# Patient Record
Sex: Female | Born: 1965
Health system: Southern US, Community
[De-identification: ages and names within clinical notes are randomized; demographics above are authoritative.]

## PROBLEM LIST (undated history)

## (undated) DIAGNOSIS — D649 Anemia, unspecified: Secondary | ICD-10-CM

## (undated) DIAGNOSIS — Z853 Personal history of malignant neoplasm of breast: Secondary | ICD-10-CM

## (undated) DIAGNOSIS — J189 Pneumonia, unspecified organism: Secondary | ICD-10-CM

## (undated) DIAGNOSIS — R0600 Dyspnea, unspecified: Secondary | ICD-10-CM

## (undated) DIAGNOSIS — J479 Bronchiectasis, uncomplicated: Secondary | ICD-10-CM

## (undated) DIAGNOSIS — Z862 Personal history of diseases of the blood and blood-forming organs and certain disorders involving the immune mechanism: Secondary | ICD-10-CM

## (undated) DIAGNOSIS — Z923 Personal history of irradiation: Secondary | ICD-10-CM

## (undated) DIAGNOSIS — T8859XA Other complications of anesthesia, initial encounter: Secondary | ICD-10-CM

## (undated) DIAGNOSIS — M1990 Unspecified osteoarthritis, unspecified site: Secondary | ICD-10-CM

## (undated) DIAGNOSIS — C787 Secondary malignant neoplasm of liver and intrahepatic bile duct: Secondary | ICD-10-CM

## (undated) DIAGNOSIS — J449 Chronic obstructive pulmonary disease, unspecified: Secondary | ICD-10-CM

## (undated) DIAGNOSIS — J219 Acute bronchiolitis, unspecified: Secondary | ICD-10-CM

## (undated) DIAGNOSIS — Z8709 Personal history of other diseases of the respiratory system: Secondary | ICD-10-CM

## (undated) DIAGNOSIS — Z8619 Personal history of other infectious and parasitic diseases: Secondary | ICD-10-CM

## (undated) DIAGNOSIS — C50919 Malignant neoplasm of unspecified site of unspecified female breast: Secondary | ICD-10-CM

## (undated) DIAGNOSIS — C7951 Secondary malignant neoplasm of bone: Secondary | ICD-10-CM

## (undated) DIAGNOSIS — K219 Gastro-esophageal reflux disease without esophagitis: Secondary | ICD-10-CM

## (undated) DIAGNOSIS — Z9221 Personal history of antineoplastic chemotherapy: Secondary | ICD-10-CM

## (undated) DIAGNOSIS — C50912 Malignant neoplasm of unspecified site of left female breast: Secondary | ICD-10-CM

## (undated) DIAGNOSIS — R0989 Other specified symptoms and signs involving the circulatory and respiratory systems: Secondary | ICD-10-CM

## (undated) DIAGNOSIS — J45909 Unspecified asthma, uncomplicated: Secondary | ICD-10-CM

## (undated) DIAGNOSIS — C801 Malignant (primary) neoplasm, unspecified: Secondary | ICD-10-CM

## (undated) DIAGNOSIS — T4145XA Adverse effect of unspecified anesthetic, initial encounter: Secondary | ICD-10-CM

## (undated) DIAGNOSIS — Z8719 Personal history of other diseases of the digestive system: Secondary | ICD-10-CM

## (undated) DIAGNOSIS — Z972 Presence of dental prosthetic device (complete) (partial): Secondary | ICD-10-CM

## (undated) DIAGNOSIS — R51 Headache: Secondary | ICD-10-CM

## (undated) HISTORY — DX: Headache: R51

## (undated) HISTORY — DX: Malignant neoplasm of unspecified site of unspecified female breast: C50.919

## (undated) HISTORY — DX: Personal history of irradiation: Z92.3

---

## 1996-05-11 HISTORY — PX: LUNG LOBECTOMY: SHX167

## 2004-07-11 HISTORY — PX: BREAST CYST EXCISION: SHX579

## 2006-08-05 ENCOUNTER — Emergency Department (HOSPITAL_COMMUNITY): Admission: EM | Admit: 2006-08-05 | Discharge: 2006-08-05 | Payer: Self-pay | Admitting: Emergency Medicine

## 2006-11-03 ENCOUNTER — Emergency Department (HOSPITAL_COMMUNITY): Admission: EM | Admit: 2006-11-03 | Discharge: 2006-11-03 | Payer: Self-pay | Admitting: Family Medicine

## 2007-01-16 ENCOUNTER — Emergency Department (HOSPITAL_COMMUNITY): Admission: EM | Admit: 2007-01-16 | Discharge: 2007-01-16 | Payer: Self-pay | Admitting: Emergency Medicine

## 2007-02-09 ENCOUNTER — Emergency Department (HOSPITAL_COMMUNITY): Admission: EM | Admit: 2007-02-09 | Discharge: 2007-02-09 | Payer: Self-pay | Admitting: Emergency Medicine

## 2007-03-16 ENCOUNTER — Emergency Department (HOSPITAL_COMMUNITY): Admission: EM | Admit: 2007-03-16 | Discharge: 2007-03-16 | Payer: Self-pay | Admitting: Family Medicine

## 2007-04-21 ENCOUNTER — Emergency Department (HOSPITAL_COMMUNITY): Admission: EM | Admit: 2007-04-21 | Discharge: 2007-04-21 | Payer: Self-pay | Admitting: Emergency Medicine

## 2007-11-29 ENCOUNTER — Emergency Department (HOSPITAL_COMMUNITY): Admission: EM | Admit: 2007-11-29 | Discharge: 2007-11-29 | Payer: Self-pay | Admitting: Emergency Medicine

## 2008-12-10 ENCOUNTER — Emergency Department (HOSPITAL_COMMUNITY): Admission: EM | Admit: 2008-12-10 | Discharge: 2008-12-10 | Payer: Self-pay | Admitting: Family Medicine

## 2009-12-16 ENCOUNTER — Emergency Department (HOSPITAL_COMMUNITY): Admission: EM | Admit: 2009-12-16 | Discharge: 2009-12-16 | Payer: Self-pay | Admitting: Family Medicine

## 2011-01-17 ENCOUNTER — Inpatient Hospital Stay (INDEPENDENT_AMBULATORY_CARE_PROVIDER_SITE_OTHER)
Admission: RE | Admit: 2011-01-17 | Discharge: 2011-01-17 | Disposition: A | Discharge status: Home / Self Care | Payer: BLUE CROSS/BLUE SHIELD | Source: Ambulatory Visit | Attending: Emergency Medicine | Admitting: Emergency Medicine

## 2011-01-17 ENCOUNTER — Ambulatory Visit (INDEPENDENT_AMBULATORY_CARE_PROVIDER_SITE_OTHER): Payer: BLUE CROSS/BLUE SHIELD

## 2011-01-17 DIAGNOSIS — J189 Pneumonia, unspecified organism: Secondary | ICD-10-CM

## 2011-01-26 ENCOUNTER — Other Ambulatory Visit (INDEPENDENT_AMBULATORY_CARE_PROVIDER_SITE_OTHER): Payer: BC Managed Care – PPO

## 2011-01-26 ENCOUNTER — Ambulatory Visit (INDEPENDENT_AMBULATORY_CARE_PROVIDER_SITE_OTHER): Payer: BC Managed Care – PPO | Admitting: Internal Medicine

## 2011-01-26 ENCOUNTER — Encounter: Payer: Self-pay | Admitting: Internal Medicine

## 2011-01-26 ENCOUNTER — Ambulatory Visit (INDEPENDENT_AMBULATORY_CARE_PROVIDER_SITE_OTHER)
Admission: RE | Admit: 2011-01-26 | Discharge: 2011-01-26 | Disposition: A | Discharge status: Home / Self Care | Payer: BC Managed Care – PPO | Source: Ambulatory Visit | Attending: Internal Medicine | Admitting: Internal Medicine

## 2011-01-26 ENCOUNTER — Telehealth: Payer: Self-pay | Admitting: Internal Medicine

## 2011-01-26 ENCOUNTER — Other Ambulatory Visit: Payer: Self-pay | Admitting: Internal Medicine

## 2011-01-26 DIAGNOSIS — J479 Bronchiectasis, uncomplicated: Secondary | ICD-10-CM | POA: Insufficient documentation

## 2011-01-26 DIAGNOSIS — R079 Chest pain, unspecified: Secondary | ICD-10-CM

## 2011-01-26 DIAGNOSIS — F17200 Nicotine dependence, unspecified, uncomplicated: Secondary | ICD-10-CM

## 2011-01-26 DIAGNOSIS — F17210 Nicotine dependence, cigarettes, uncomplicated: Secondary | ICD-10-CM | POA: Insufficient documentation

## 2011-01-26 LAB — CBC WITH DIFFERENTIAL/PLATELET
Hemoglobin: 13.2 g/dL (ref 12.0–15.0)
Lymphocytes Relative: 24.4 % (ref 12.0–46.0)
Lymphs Abs: 2.4 10*3/uL (ref 0.7–4.0)
Monocytes Absolute: 0.7 10*3/uL (ref 0.1–1.0)
Neutro Abs: 5.7 10*3/uL (ref 1.4–7.7)
Platelets: 426 10*3/uL — ABNORMAL HIGH (ref 150.0–400.0)
RBC: 4.15 Mil/uL (ref 3.87–5.11)
RDW: 14.7 % — ABNORMAL HIGH (ref 11.5–14.6)

## 2011-01-26 LAB — BASIC METABOLIC PANEL
CO2: 28 mEq/L (ref 19–32)
Creatinine, Ser: 0.6 mg/dL (ref 0.4–1.2)
GFR: 109.61 mL/min (ref >60.00)
Glucose, Bld: 89 mg/dL (ref 70–99)
Potassium: 4.5 mEq/L (ref 3.5–5.1)

## 2011-01-26 LAB — SEDIMENTATION RATE: Sed Rate: 84 mm/hr — ABNORMAL HIGH (ref 0–22)

## 2011-01-26 MED ORDER — TRAMADOL HCL 50 MG PO TABS: ORAL_TABLET | ORAL | Status: AC

## 2011-01-26 NOTE — Progress Notes (Signed)
Quick Note:  Spoke with pt and notified of results per Dr. Wert. Pt verbalized understanding and denied any questions.  ______ 

## 2011-01-26 NOTE — Patient Instructions (Addendum)
Try prilosec 20mg   Take 30-60 min before first meal of the day and Pepcid 20 mg one bedtime until pain and cough are completely gone for at least a week without the need for cough suppression or tramadol  GERD (REFLUX)  is an extremely common cause of respiratory symptoms, many times with no significant heartburn at all.    It can be treated with medication, but also with lifestyle changes including avoidance of late meals, excessive alcohol, smoking cessation, and avoid fatty foods, chocolate, peppermint, colas, red wine, and acidic juices such as orange juice.  NO MINT OR MENTHOL PRODUCTS SO NO COUGH DROPS  USE SUGARLESS CANDY INSTEAD (jolley ranchers or Stover's)  NO OIL BASED VITAMINS   Tramadol 50 mg one every 4 hours as needed for pain  Commit to quit smoking   Please see patient coordinator before you leave today  to schedule for ct chest   Please schedule a follow up office visit in 2  weeks, sooner if needed   LATE ADD: Needs pneumovax now and in 5 years per guidelines - address next ov

## 2011-01-26 NOTE — Assessment & Plan Note (Addendum)
Recurrent pattern for over a decade suggests a dx of bronchiectasis with exac, not pneumonia.  Discussed in detail all the  indications, usual  risks and alternatives  relative to the benefits with patient and husban who agree  to proceed with conservative rx for now with lab profile, ct chest and regroup in 2 weeks

## 2011-01-26 NOTE — Progress Notes (Signed)
Subjective:     Patient ID: Melissa [name], female   DOB: 12-Dec-1965, 45 y.o.   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   No sweats or nasty mucus now. Some better at day of ov p rx with  rx dex and zithromax but  some sorethroat since dex with overt hb.   Pt denies any significant   itching, sneezing,  nasal congestion or excess/ purulent secretions,  fever, chills, sweats, unintended wt loss,   exertional cp, hempoptysis, orthopnea pnd or leg swelling.    Also denies any obvious fluctuation of symptoms with weather or environmental changes or other aggravating or alleviating factors.      Review of Systems  Constitutional: Negative for fever, chills and unexpected weight change.  HENT: Positive for sore throat and trouble swallowing. Negative for ear pain, nosebleeds, congestion, rhinorrhea, sneezing, dental problem, voice change, postnasal drip and sinus pressure.   Eyes: Negative for visual disturbance.  Respiratory: Positive for cough and shortness of breath. Negative for choking and wheezing.   Cardiovascular: Positive for chest pain and palpitations. Negative for leg swelling.  Gastrointestinal: Negative for vomiting, abdominal pain and diarrhea.  Genitourinary: Negative for difficulty urinating.  Musculoskeletal: Negative for arthralgias.  Skin: Negative for rash.  Neurological:  Negative for tremors, syncope and headaches.  Hematological: Does not bruise/bleed easily.       Objective:   Physical Exam Mod anxious but pleasant amb wf nad HEENT: nl dentition, turbinates, and orophanx. Nl external ear canals without cough reflex   NECK :  without JVD/Nodes/TM/ nl carotid upstrokes bilaterally   LUNGS: no acc muscle use, clear to A and P bilaterally without cough on insp or exp maneuvers   CV:  RRR  no s3 or murmur or increase in P2, no edema   ABD:  soft and nontender with nl excursion in the supine position. No bruits or organomegaly, bowel sounds nl  MS:  warm without deformities, calf tenderness, cyanosis or clubbing  SKIN: warm and dry without lesions    NEURO:  alert, approp, no deficits     cxr 01/26/2011 1. Progressive opacification and volume loss at the apex of the  left hemithorax since prev study 2008.  Superimposed pneumonia could have this  appearance. Malignancy cannot be excluded. Follow-up to clearing  or chest CT with contrast is recommended.  2. Left basilar scarring.  Assessment:         Plan:

## 2011-01-27 NOTE — Telephone Encounter (Signed)
Results have been reviewed by MW and pt is aware.

## 2011-01-31 ENCOUNTER — Telehealth: Payer: Self-pay | Admitting: Internal Medicine

## 2011-01-31 NOTE — Telephone Encounter (Signed)
Pt states she faxed a physician statement form for [facility] to be completed by Dr. Sherene Sires on 01-27-11 and she was wanting to make sure we received it. I advised her I will send message to [facility] nurse to see if she has seen the paperwork. I also advised her on protocol of sending the paperwork to [employer] to have them complete. Pt states understanding. I called [employer] and they state they do not have the forms . Please advise.Carron Curie, CMA

## 2011-01-31 NOTE — Telephone Encounter (Signed)
LMOMTCB x 1 

## 2011-02-07 NOTE — Telephone Encounter (Signed)
Verlon Au, please advise if you have these papers or know the status of them. Thanks.

## 2011-02-10 ENCOUNTER — Ambulatory Visit (INDEPENDENT_AMBULATORY_CARE_PROVIDER_SITE_OTHER): Payer: BC Managed Care – PPO | Admitting: Internal Medicine

## 2011-02-10 ENCOUNTER — Encounter: Payer: Self-pay | Admitting: Internal Medicine

## 2011-02-10 DIAGNOSIS — J479 Bronchiectasis, uncomplicated: Secondary | ICD-10-CM

## 2011-02-10 DIAGNOSIS — F17200 Nicotine dependence, unspecified, uncomplicated: Secondary | ICD-10-CM

## 2011-02-10 MED ORDER — AMOXICILLIN 500 MG PO CAPS: 500.0000 mg | ORAL_CAPSULE | Freq: Three times a day (TID) | ORAL | Status: AC

## 2011-02-10 NOTE — Progress Notes (Signed)
Subjective:     Patient ID: Melissa [name], female   DOB: [birth year], 45 y.o.   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   No sweats or nasty mucus now. Some better at day of ov p rx with  rx dex and zithromax but  some sorethroat since dex with overt hb.rec ct chest POS BRONCHIECTASIS    02/10/2011 ov/[name] cc cp better, only used 3 tramadol since last ov. No sob  Pt denies any significant sore throat, dysphagia, itching, sneezing,  nasal congestion or excess/ purulent secretions,  fever, chills, sweats, unintended wt loss, pleuritic or exertional cp, hempoptysis, orthopnea pnd or leg swelling.    Also denies any obvious fluctuation of symptoms with weather or environmental changes or other aggravating or alleviating factors.            Objective:   Physical Exam Mod anxious but pleasant amb wf nad HEENT: nl dentition, turbinates, and orophanx. Nl external ear canals without cough reflex   NECK :  without JVD/Nodes/TM/ nl carotid upstrokes bilaterally   LUNGS: no acc muscle use, clear to A and P bilaterally without cough on insp or exp maneuvers   CV:  RRR  no s3 or murmur or increase in P2, no edema   ABD:  soft and nontender with nl excursion in the supine position. No bruits or organomegaly, bowel sounds nl  MS:  warm without deformities, calf tenderness, cyanosis or  clubbing  SKIN: warm and dry without lesions    NEURO:  alert, approp, no deficits     cxr 01/26/2011 1. Progressive opacification and volume loss at the apex of the  left hemithorax since prev study 2008.  Superimposed pneumonia could have this  appearance. Malignancy cannot be excluded. Follow-up to clearing  or chest CT with contrast is recommended.  2. Left basilar scarring.  Assessment:         Plan:

## 2011-02-10 NOTE — Telephone Encounter (Signed)
Discussed this with pt today at OV and she states that this issue has already been resolved.

## 2011-02-10 NOTE — Assessment & Plan Note (Signed)
Classic stereotypical pattern unchanged x years is c/w bronchiectasis  I had an extended discussion with the patient today lasting 15 to 20 minutes of a 25 minute visit on the following issues:  Discussed in detail all the  indications, usual  risks and alternatives  relative to the benefits with patient who agrees to proceed with medical management as outline in instructions.  See instructions for specific recommendations which were reviewed directly with the patient who was given a copy with highlighter outlining the key components.

## 2011-02-10 NOTE — Assessment & Plan Note (Signed)
Considering chantix, urged to commit to quit and follow through

## 2011-02-10 NOTE — Patient Instructions (Addendum)
Bronchiectasis =   you have scarring of your bronchial tubes which means that they don't function perfectly normally and mucus tends to pool in certain areas of your lung which can cause pneumonia and further scarring of your lung and bronchial tubes  Whenever you develop cough congestion take mucinex or mucinex dm > these will help keep the mucus loose and flowing but if your condition worsens you need to seek help immediately preferably here or somewhere inside the [facility] system to compare xrays ( worse = darker or bloody mucus or pain on breathing in)   Amoxicillin 500mg  3 x daily x 10 days is a reasonable way to treat flares (increased pain, change in mucus) with less tendency to cause side effects than many choices.  Stopping smoking is critical  Please schedule a follow up visit in 3 months but call sooner if needed  - needs f/u cxr  On return, placed in tickle file

## 2011-04-06 ENCOUNTER — Telehealth: Payer: Self-pay | Admitting: *Deleted

## 2011-04-06 NOTE — Telephone Encounter (Signed)
LMTCB for pt - She needs rov with cxr by 04/27/11.

## 2011-04-06 NOTE — Telephone Encounter (Signed)
OV sched 04/25/11

## 2011-04-06 NOTE — Telephone Encounter (Signed)
Message copied by Christen Butter on Wed Apr 06, 2011 11:37 AM ------      Message from: Christen Butter      Created: Tue Feb 15, 2011 10:33 AM       Needs cxr by 04/27/11

## 2011-04-21 LAB — POCT URINALYSIS DIP (DEVICE)
Nitrite: NEGATIVE
Specific Gravity, Urine: 1.01
Urobilinogen, UA: 0.2

## 2011-04-22 LAB — CBC
Hemoglobin: 13.5
MCHC: 34.1
Platelets: 328
RBC: 4.24

## 2011-04-22 LAB — DIFFERENTIAL
Basophils Absolute: 0.1
Basophils Relative: 1
Lymphocytes Relative: 16
Monocytes Absolute: 0.8 — ABNORMAL HIGH
Monocytes Relative: 8
Neutro Abs: 7.2
Neutrophils Relative %: 68

## 2011-04-25 ENCOUNTER — Encounter: Payer: BC Managed Care – PPO | Admitting: Internal Medicine

## 2011-04-25 LAB — STREP A DNA PROBE: Group A Strep Probe: NEGATIVE

## 2011-04-25 LAB — POCT RAPID STREP A: Streptococcus, Group A Screen (Direct): NEGATIVE

## 2011-04-25 NOTE — Progress Notes (Signed)
Subjective:     Patient ID: Melissa [name], female   DOB: Mar 09, 1966, 45 y.o.   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   No sweats or nasty mucus now. Some better at day of ov p rx with  rx dex and zithromax but  some sorethroat since dex with overt hb.rec ct chest POS BRONCHIECTASIS    02/10/2011 ov/Manjinder Breau cc cp better, only used 3 tramadol since last ov. No sob.    04/25/2011 f/u ov/Erick Murin cc Pt denies any significant sore throat, dysphagia, itching, sneezing,  nasal congestion or excess/ purulent secretions,  fever, chills, sweats, unintended wt loss, pleuritic or exertional cp, hempoptysis, orthopnea pnd or leg swelling.    Also denies any obvious fluctuation of symptoms with weather or environmental changes or other aggravating or alleviating factors.            Objective:   Physical Exam Mod anxious but pleasant amb wf nad Wt 118  02/10/11 >  04/25/2011  HEENT: nl dentition, turbinates, and orophanx. Nl external ear canals without cough reflex   NECK :  without JVD/Nodes/TM/ nl carotid upstrokes bilaterally   LUNGS: no acc muscle use, clear to A and P bilaterally without cough on insp or exp maneuvers   CV:  RRR  no s3 or murmur or increase in P2, no edema   ABD:  soft and nontender with nl excursion in the supine position. No bruits or organomegaly, bowel sounds nl  MS:   warm without deformities, calf tenderness, cyanosis or clubbing  SKIN: warm and dry without lesions    NEURO:  alert, approp, no deficits     cxr 01/26/2011 1. Progressive opacification and volume loss at the apex of the  left hemithorax since prev study 2008.  Superimposed pneumonia could have this  appearance. Malignancy cannot be excluded. Follow-up to clearing  or chest CT with contrast is recommended.  2. Left basilar scarring.  Assessment:         Plan:

## 2011-05-03 ENCOUNTER — Encounter: Payer: Self-pay | Admitting: Internal Medicine

## 2011-05-03 ENCOUNTER — Ambulatory Visit (INDEPENDENT_AMBULATORY_CARE_PROVIDER_SITE_OTHER)
Admission: RE | Admit: 2011-05-03 | Discharge: 2011-05-03 | Disposition: A | Discharge status: Home / Self Care | Payer: BC Managed Care – PPO | Source: Ambulatory Visit | Attending: Internal Medicine | Admitting: Internal Medicine

## 2011-05-03 ENCOUNTER — Ambulatory Visit (INDEPENDENT_AMBULATORY_CARE_PROVIDER_SITE_OTHER): Payer: BC Managed Care – PPO | Admitting: Internal Medicine

## 2011-05-03 DIAGNOSIS — Z23 Encounter for immunization: Secondary | ICD-10-CM

## 2011-05-03 DIAGNOSIS — J479 Bronchiectasis, uncomplicated: Secondary | ICD-10-CM

## 2011-05-03 NOTE — Progress Notes (Signed)
Subjective:     Patient ID: Melissa [name], female   DOB: May 01, 1966, 45 y.o.   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   No sweats or nasty mucus now. Some better at day of ov p rx with  rx dex and zithromax but  some sorethroat since dex with overt hb.rec ct chest POS BRONCHIECTASIS    02/10/2011 ov/Wert cc cp better, only used 3 tramadol since last ov. No sob. rec Bronchiectasis =   you have scarring of your bronchial tubes which means that they don't function perfectly normally and mucus tends to pool in certain areas of your lung which can cause pneumonia and further scarring of your lung and bronchial tubes  Whenever you develop cough congestion take mucinex or mucinex dm > these will help keep the mucus loose and flowing but if your condition worsens you need to seek help immediately preferably here or somewhere inside the [facility] system to compare xrays ( worse = darker or bloody mucus or pain on breathing in)   Amoxicillin 500mg  3 x daily x 10 days is a reasonable way to treat flares (increased pain, change in mucus) with less tendency to cause side effects than many choices.  Stopping smoking is critical  Please schedule a follow up visit in 3 months but call sooner if needed  - needs f/u cxr      05/03/2011  f/u ov/Wert cc cough imporved but worst first thing in am, took one course of  amoxicillin, rare albuterol use. 2 flights doe = baseline   Sleeping ok without nocturnal  or early am exacerbation  of respiratory  c/o's or need for noct saba. Also denies any obvious fluctuation of symptoms with weather or environmental changes or other aggravating or alleviating factors except as outlined above    Pt denies any significant sore throat, dysphagia, itching, sneezing,  nasal congestion or excess/ purulent secretions,  fever, chills, sweats, unintended wt loss, pleuritic or exertional cp, hempoptysis, orthopnea pnd or leg swelling.              Objective:   Physical Exam Mod anxious but pleasant amb wf nad  Wt 118  02/10/11 >  05/03/2011  118  HEENT: nl dentition, turbinates, and orophanx. Nl external ear canals without cough reflex   NECK :  without JVD/Nodes/TM/ nl carotid upstrokes bilaterally   LUNGS: no acc muscle use, clear to A and P bilaterally without cough on insp or exp maneuvers   CV:  RRR  no s3 or murmur or increase in P2, no edema   ABD:  soft and nontender with nl excursion in the supine position. No bruits or organomegaly, bowel sounds nl  MS:  warm without deformities, calf tenderness, cyanosis or clubbing     CXR  05/03/2011 :  Stable appearance  of opacity in the medial aspect of left upper hemithorax consistent with prior left upper lobectomy. Overall hyperinflation configuration. No acute superimposed process. No pulmonary edema, pneumonia, or pleural effusion is evident   Assessment:         Plan:

## 2011-05-03 NOTE — Assessment & Plan Note (Signed)
-   CT chest   01/26/2011 Status post left upper lobectomy.  Focal bronchiectasis / scarring medially in the left lung apex with  surrounding inflammatory changes  - Pneumovax 02/10/2011    CXR no change today and doing very well on a minimal regimen but still smoking. Needs pft's for baseline purposes yet and commit to quit

## 2011-05-03 NOTE — Patient Instructions (Signed)
Please schedule a follow up visit in 3 months but call sooner if needed with PFT's on return.

## 2012-03-09 ENCOUNTER — Other Ambulatory Visit: Payer: Self-pay | Admitting: Internal Medicine

## 2012-03-23 ENCOUNTER — Encounter: Payer: Self-pay | Admitting: Adult Health

## 2012-03-23 ENCOUNTER — Ambulatory Visit (INDEPENDENT_AMBULATORY_CARE_PROVIDER_SITE_OTHER): Payer: BC Managed Care – PPO | Admitting: Adult Health

## 2012-03-23 VITALS — BP 106/64 | HR 77 | Temp 97.6°F | Ht 62.0 in | Wt 117.0 lb

## 2012-03-23 DIAGNOSIS — J479 Bronchiectasis, uncomplicated: Secondary | ICD-10-CM

## 2012-03-23 MED ORDER — AMOXICILLIN 500 MG PO CAPS: 500.0000 mg | ORAL_CAPSULE | Freq: Three times a day (TID) | ORAL | Status: AC

## 2012-03-23 MED ORDER — AMOXICILLIN-POT CLAVULANATE 875-125 MG PO TABS: 1.0000 | ORAL_TABLET | Freq: Two times a day (BID) | ORAL | Status: AC

## 2012-03-23 MED ORDER — AMOXICILLIN 500 MG PO CAPS: 500.0000 mg | ORAL_CAPSULE | Freq: Three times a day (TID) | ORAL | Status: DC

## 2012-03-23 NOTE — Progress Notes (Signed)
Subjective:     Patient ID: Melissa [name], female   DOB: 07/07/1966, 46 y.o.   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   No sweats or nasty mucus now. Some better at day of ov p rx with  rx dex and zithromax but  some sorethroat since dex with overt hb.rec ct chest POS BRONCHIECTASIS    02/10/2011 ov/Wert cc cp better, only used 3 tramadol since last ov. No sob. rec Bronchiectasis =   you have scarring of your bronchial tubes which means that they don't function perfectly normally and mucus tends to pool in certain areas of your lung which can cause pneumonia and further scarring of your lung and bronchial tubes  Whenever you develop cough congestion take mucinex or mucinex dm > these will help keep the mucus loose and flowing but if your condition worsens you need to seek help immediately preferably here or somewhere inside the [facility] system to compare xrays ( worse = darker or bloody mucus or pain on breathing in)   Amoxicillin 500mg  3 x daily x 10 days is a reasonable way to treat flares (increased pain, change in mucus) with less tendency to cause side effects than many choices.  Stopping smoking is critical  Please schedule a follow up visit in 3 months but call sooner if needed  - needs f/u cxr      05/03/2011  f/u ov/Wert cc cough imporved but worst first thing in am, took one course of  amoxicillin, rare albuterol use. 2 flights doe = baseline >>no changes   03/23/2012 Acute OV  Complains of wheezing, chest congestion, occ prod cough with small amounts of clear/yellow mucus, occ tightness in chest, increased DOE x2 weeks.  Still smoking . Discussed smoking cessatation  Ran out of Amoxicillin this month. Has been taking Amoxicillin first 10 days of each month .  No fever or hemoptysis or edema.       ROS:  Constitutional:   No  weight loss, night sweats,  Fevers, chills,  +fatigue, or  lassitude.  HEENT:   No headaches,  Difficulty swallowing,  Tooth/dental problems, or  Sore throat,                No sneezing, itching, ear ache,  +nasal congestion, post nasal drip,   CV:  No chest pain,  Orthopnea, PND, swelling in lower extremities, anasarca, dizziness, palpitations, syncope.   GI  No heartburn, indigestion, abdominal pain, nausea, vomiting, diarrhea, change in bowel habits, loss of appetite, bloody stools.   Resp:        No chest wall deformity  Skin: no rash or lesions.  GU: no dysuria, change in color of urine, no urgency or frequency.  No flank pain, no hematuria   MS:  No joint pain or swelling.  No  decreased range of motion.  No back pain.  Psych:  No change in mood or affect. No depression or anxiety.  No memory loss.         Objective:   Physical Exam Mod anxious but pleasant amb wf nad  Wt 118  02/10/11 >  05/03/2011  118 > 117 03/23/2012   HEENT: nl dentition, turbinates, and orophanx. Nl external ear canals without cough reflex   NECK :  without JVD/Nodes/TM/ nl carotid upstrokes bilaterally   LUNGS: no acc muscle use, coarse BS    CV:  RRR  no s3 or murmur or increase in P2, no edema   ABD:  soft and nontender with nl excursion in the supine position. No bruits or organomegaly, bowel sounds nl  MS:  warm without deformities, calf tenderness, cyanosis or clubbing     CXR  05/03/2011 :  Stable appearance of opacity in the medial  aspect of left upper hemithorax consistent with prior left upper lobectomy. Overall hyperinflation configuration. No acute superimposed process. No pulmonary edema, pneumonia, or pleural effusion is evident   Assessment:         Plan:

## 2012-03-23 NOTE — Patient Instructions (Addendum)
Augmentin 875mg  Twice daily  For 10 days  Mucinex DM Twice daily  As needed  .  Fluids and rest  Beginning,  October  restart Amoxicillin 500mg  Three times a day  For the first 10 days of each month  follow up with Dr. Sherene Sires  In 6 weeks with PFT  Please contact office for sooner follow up if symptoms do not improve or worsen or seek emergency care

## 2012-03-23 NOTE — Addendum Note (Signed)
Addended by: Boone Master E on: 03/23/2012 03:51 PM   Modules accepted: Orders

## 2012-03-23 NOTE — Assessment & Plan Note (Signed)
Flare   Plan  Augmentin 875mg  Twice daily  For 10 days  Mucinex DM Twice daily  As needed  .  Fluids and rest  Beginning,  October  restart Amoxicillin 500mg  Three times a day  For the first 10 days of each month  follow up with Dr. Sherene Sires  In 6 weeks with PFT  Please contact office for sooner follow up if symptoms do not improve or worsen or seek emergency care

## 2012-05-29 ENCOUNTER — Other Ambulatory Visit: Payer: Self-pay | Admitting: Family Medicine

## 2012-05-29 DIAGNOSIS — N63 Unspecified lump in unspecified breast: Secondary | ICD-10-CM

## 2012-06-05 ENCOUNTER — Ambulatory Visit
Admission: RE | Admit: 2012-06-05 | Discharge: 2012-06-05 | Disposition: A | Discharge status: Home / Self Care | Payer: BC Managed Care – PPO | Source: Ambulatory Visit | Attending: Family Medicine | Admitting: Family Medicine

## 2012-06-05 ENCOUNTER — Other Ambulatory Visit: Payer: Self-pay | Admitting: Family Medicine

## 2012-06-05 DIAGNOSIS — N63 Unspecified lump in unspecified breast: Secondary | ICD-10-CM

## 2012-06-06 ENCOUNTER — Other Ambulatory Visit: Payer: Self-pay | Admitting: Family Medicine

## 2012-06-06 ENCOUNTER — Ambulatory Visit
Admission: RE | Admit: 2012-06-06 | Discharge: 2012-06-06 | Disposition: A | Discharge status: Home / Self Care | Payer: BC Managed Care – PPO | Source: Ambulatory Visit | Attending: Family Medicine | Admitting: Family Medicine

## 2012-06-06 DIAGNOSIS — N63 Unspecified lump in unspecified breast: Secondary | ICD-10-CM

## 2012-06-06 DIAGNOSIS — C50911 Malignant neoplasm of unspecified site of right female breast: Secondary | ICD-10-CM

## 2012-06-08 ENCOUNTER — Telehealth: Payer: Self-pay | Admitting: *Deleted

## 2012-06-08 DIAGNOSIS — C50219 Malignant neoplasm of upper-inner quadrant of unspecified female breast: Secondary | ICD-10-CM

## 2012-06-08 DIAGNOSIS — C50212 Malignant neoplasm of upper-inner quadrant of left female breast: Secondary | ICD-10-CM | POA: Insufficient documentation

## 2012-06-08 NOTE — Telephone Encounter (Signed)
Confirmed [facility] for 06/13/12 at 0800 .  Instructions and contact information given.

## 2012-06-12 ENCOUNTER — Ambulatory Visit
Admission: RE | Admit: 2012-06-12 | Discharge: 2012-06-12 | Disposition: A | Discharge status: Home / Self Care | Payer: BC Managed Care – PPO | Source: Ambulatory Visit | Attending: Family Medicine | Admitting: Family Medicine

## 2012-06-12 DIAGNOSIS — C50911 Malignant neoplasm of unspecified site of right female breast: Secondary | ICD-10-CM

## 2012-06-12 MED ORDER — GADOBENATE DIMEGLUMINE 529 MG/ML IV SOLN
9.0000 mL | Freq: Once | INTRAVENOUS | Status: AC | PRN
  Administered 2012-06-12: 9 mL via INTRAVENOUS

## 2012-06-13 ENCOUNTER — Encounter: Payer: Self-pay | Admitting: Oncology

## 2012-06-13 ENCOUNTER — Ambulatory Visit: Payer: BC Managed Care – PPO | Admitting: Physical Therapy

## 2012-06-13 ENCOUNTER — Other Ambulatory Visit (HOSPITAL_BASED_OUTPATIENT_CLINIC_OR_DEPARTMENT_OTHER): Payer: BC Managed Care – PPO

## 2012-06-13 ENCOUNTER — Ambulatory Visit: Payer: BC Managed Care – PPO

## 2012-06-13 ENCOUNTER — Ambulatory Visit (HOSPITAL_BASED_OUTPATIENT_CLINIC_OR_DEPARTMENT_OTHER): Payer: BC Managed Care – PPO | Admitting: Oncology

## 2012-06-13 ENCOUNTER — Ambulatory Visit: Payer: BC Managed Care – PPO | Attending: General Surgery | Admitting: Physical Therapy

## 2012-06-13 ENCOUNTER — Telehealth: Payer: Self-pay | Admitting: *Deleted

## 2012-06-13 ENCOUNTER — Encounter: Payer: Self-pay | Admitting: *Deleted

## 2012-06-13 ENCOUNTER — Ambulatory Visit
Admission: RE | Admit: 2012-06-13 | Discharge: 2012-06-13 | Disposition: A | Discharge status: Home / Self Care | Payer: BC Managed Care – PPO | Source: Ambulatory Visit | Attending: Radiation Oncology | Admitting: Radiation Oncology

## 2012-06-13 ENCOUNTER — Ambulatory Visit (HOSPITAL_BASED_OUTPATIENT_CLINIC_OR_DEPARTMENT_OTHER): Payer: BC Managed Care – PPO | Admitting: General Surgery

## 2012-06-13 VITALS — BP 108/74 | HR 76 | Temp 98.6°F | Resp 20 | Ht 62.0 in | Wt 114.8 lb

## 2012-06-13 DIAGNOSIS — C50219 Malignant neoplasm of upper-inner quadrant of unspecified female breast: Secondary | ICD-10-CM

## 2012-06-13 DIAGNOSIS — Z809 Family history of malignant neoplasm, unspecified: Secondary | ICD-10-CM

## 2012-06-13 DIAGNOSIS — R293 Abnormal posture: Secondary | ICD-10-CM | POA: Insufficient documentation

## 2012-06-13 DIAGNOSIS — C50919 Malignant neoplasm of unspecified site of unspecified female breast: Secondary | ICD-10-CM

## 2012-06-13 DIAGNOSIS — Z170 Estrogen receptor positive status [ER+]: Secondary | ICD-10-CM

## 2012-06-13 DIAGNOSIS — IMO0001 Reserved for inherently not codable concepts without codable children: Secondary | ICD-10-CM | POA: Insufficient documentation

## 2012-06-13 DIAGNOSIS — M25619 Stiffness of unspecified shoulder, not elsewhere classified: Secondary | ICD-10-CM | POA: Insufficient documentation

## 2012-06-13 LAB — CBC WITH DIFFERENTIAL/PLATELET
Basophils Absolute: 0.1 10*3/uL (ref 0.0–0.1)
EOS%: 4.8 % (ref 0.0–7.0)
HGB: 14.5 g/dL (ref 11.6–15.9)
LYMPH%: 20.9 % (ref 14.0–49.7)
MCH: 33.2 pg (ref 25.1–34.0)
MCV: 96.3 fL (ref 79.5–101.0)
MONO%: 6.9 % (ref 0.0–14.0)
RBC: 4.35 10*6/uL (ref 3.70–5.45)
RDW: 13.3 % (ref 11.2–14.5)

## 2012-06-13 LAB — COMPREHENSIVE METABOLIC PANEL (CC13)
AST: 14 U/L (ref 5–34)
Albumin: 3.8 g/dL (ref 3.5–5.0)
Alkaline Phosphatase: 63 U/L (ref 40–150)
BUN: 10 mg/dL (ref 7.0–26.0)
Creatinine: 0.8 mg/dL (ref 0.6–1.1)
Potassium: 4.6 mEq/L (ref 3.5–5.1)
Total Bilirubin: 0.28 mg/dL (ref 0.20–1.20)

## 2012-06-13 NOTE — Progress Notes (Signed)
[facility] Radiation Oncology NEW PATIENT EVALUATION  Name: Melissa [name] MRN: [id number]  Date:   06/13/2012           DOB: 24-Sep-1965  Status: outpatient   CC: Benita Stabile, MD  Hoxworth, Lorne Skeens, MD    REFERRING PHYSICIAN: Mariella Saa, MD   DIAGNOSIS: Stage II A. 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 female who is seen today for the courtesy Dr. Johna Sheriff at the [facility] 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 She noted a mass along the superior aspect of her left breast just over a month ago. She underwent diagnostic mammography on 06/05/2012 which showed an irregular mass with suspicious microcalcifications in the upper portion of the left breast. A 3 cm mass could be palpated at 12:00, 4 cm from the nipple. On ultrasound the mass with calcifications measured 2.5 x 2.6 x 1.5 cm. Ultrasound of the left axilla showed a prominent but not definitely abnormal axillary lymph node. An ultrasound-guided biopsy on 06/05/2012 was diagnostic for invasive ductal/DCIS of the breast. Her tumor was triple positive with an elevated Ki-67 of 70%. The tumor was high-grade. Breast MR showed a 3.1 x 2.4 x 1.7 mass at 12:00. There were no additional enhancing foci and there is no evidence for axillary adenopathy. She seen today with Dr. Johna Sheriff and Dr. Welton Flakes.  PREVIOUS RADIATION THERAPY: No   PAST MEDICAL HISTORY:  has a past medical history of Asthma; Breast cancer; Headache; and Back pain.     PAST SURGICAL HISTORY:  Past Surgical History  Procedure Date  . 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 Cesarean section 1995/1996  . Lobectomy 1997    upper left     FAMILY HISTORY: family history includes Asthma in her brother; Cancer in her mother; Emphysema in her mother; and Heart disease in her mother. Maternal second cousin was diagnosed with  breast cancer in her late 4s and underwent "double mastectomies" over 20 years ago. Her father is alive at 13, in her mother is alive at 42, both in attendance.   SOCIAL HISTORY:  reports that she has been smoking Cigarettes.  She has a 24 pack-year smoking history. She has never used smokeless tobacco. She reports that she does not drink alcohol or use illicit drugs. Married, 2 children, daughters ages 37 and 92. She works as a Investment banker, corporate.   ALLERGIES: Aspirin and Iodinated diagnostic agents   MEDICATIONS:  Current Outpatient Prescriptions  Medication Sig Dispense Refill  . acetaminophen-codeine (TYLENOL #3) 300-30 MG per tablet Take 1 tablet by mouth every 4 (four) hours as needed.      Marland Kitchen albuterol (VENTOLIN HFA) 108 (90 BASE) MCG/ACT inhaler Inhale 2 puffs into the lungs every 6 (six) hours as needed.        Marland Kitchen amoxicillin (AMOXIL) 500 MG capsule Take 500 mg by mouth 3 (three) times daily.      Marland Kitchen etodolac (LODINE) 400 MG tablet Take 1 tablet by mouth Twice daily as needed.      . traMADol (ULTRAM) 50 MG tablet 1 to 2 every 4 hours as needed       No current facility-administered medications for this encounter.   Facility-Administered Medications Ordered in Other Encounters  Medication Dose Route Frequency Provider Last Rate Last Dose  . [COMPLETED] gadobenate dimeglumine (  MULTIHANCE) injection 9 mL  9 mL Intravenous Once PRN Medication Radiologist, MD   9 mL at 06/12/12 1222     REVIEW OF SYSTEMS:  Pertinent items are noted in HPI.    PHYSICAL EXAM: Alert and oriented 46 year old white female appearing her stated age. Wt Readings from Last 3 Encounters:  06/13/12 114 lb 12.8 oz (52.073 kg)  03/23/12 117 lb (53.071 kg)  05/03/11 118 lb 9.6 oz (53.797 kg)   Temp Readings from Last 3 Encounters:  06/13/12 98.6 F (37 C) Oral  03/23/12 97.6 F (36.4 C) Oral  05/03/11 97.6 F (36.4 C) Oral   BP Readings from Last 3 Encounters:  06/13/12 108/74  03/23/12 106/64   05/03/11 104/62   Pulse Readings from Last 3 Encounters:  06/13/12 76  03/23/12 77  05/03/11 60   Head and neck examination: Grossly unremarkable. Nodes: Without palpable cervical, supraclavicular, or axillary lymphadenopathy. However, I do feel a soft 1.0 cm node within the left axilla. Chest: Scattered rhonchi throughout both lung zones. Heart: Regular rate and rhythm. Breasts: There is a palpable mass measuring approximately 2.5-3.0 cm at 12:00 4 cm from the nipple. No other masses are appreciated. Right breast without masses or lesions. Back without spinal or CVA tenderness. Abdomen soft without hepatomegaly. Extremities without edema. Neurologic examination: Grossly nonfocal.     LABORATORY DATA:  Lab Results  Component Value Date   WBC 9.1 06/13/2012   HGB 14.5 06/13/2012   HCT 41.9 06/13/2012   MCV 96.3 06/13/2012   PLT 285 06/13/2012   Lab Results  Component Value Date   NA 140 06/13/2012   K 4.6 06/13/2012   CL 104 06/13/2012   CO2 25 06/13/2012   Lab Results  Component Value Date   ALT 13 06/13/2012   AST 14 06/13/2012   ALKPHOS 63 06/13/2012   BILITOT 0.28 06/13/2012      IMPRESSION: Clinical stage IIA (T2 N0 M0) invasive ductal/DCIS of the left breast. In general terms we talked about management options for early stage breast cancer. We discussed mastectomy versus partial mastectomy followed by radiation therapy. Her tumor size relative to her relatively small breast size would require neoadjuvant chemotherapy for downsizing with her desire for possible breast preservation. We discussed the potential acute and late toxicities of radiation therapy and also deep inspiration/breath-hold technology to avoid cardiac irradiation. We discussed hypo-fractionated radiation therapy versus standard fractionation depending on her surgical findings, and whether not she proceed with breast preservation. We also discussed genetic testing and based on her age. I spent some time discussing with  her the need for her to stop smoking regardless of her surgical options. She was offered counseling.    PLAN: As discussed above. She will move ahead with neoadjuvant chemotherapy through Dr. Welton Flakes in be represented at the Wednesday morning conference at a later date.   I spent 40 minutes minutes face to face with the patient and more than 50% of that time was spent in counseling and/or coordination of care.

## 2012-06-13 NOTE — Patient Instructions (Addendum)
We discussed neoadjuvant chemotherapy  Medications to be prescribed  Docetaxel injection What is this medicine? DOCETAXEL (doe se TAX el) is a chemotherapy drug. It targets fast dividing cells, like cancer cells, and causes these cells to die. This medicine is used to treat many types of cancers like breast cancer, certain stomach cancers, head and neck cancer, lung cancer, and prostate cancer. This medicine may be used for other purposes; ask your health care provider or pharmacist if you have questions. What should I tell my health care provider before I take this medicine? They need to know if you have any of these conditions: -infection (especially a virus infection such as chickenpox, cold sores, or herpes) -liver disease -low blood counts, like low white cell, platelet, or red cell counts -an unusual or allergic reaction to docetaxel, polysorbate 80, other chemotherapy agents, other medicines, foods, dyes, or preservatives -pregnant or trying to get pregnant -breast-feeding How should I use this medicine? This drug is given as an infusion into a vein. It is administered in a hospital or clinic by a specially trained health care professional. Talk to your pediatrician regarding the use of this medicine in children. Special care may be needed. Overdosage: If you think you have taken too much of this medicine contact a poison control center or emergency room at once. NOTE: This medicine is only for you. Do not share this medicine with others. What if I miss a dose? It is important not to miss your dose. Call your doctor or health care professional if you are unable to keep an appointment. What may interact with this medicine? -cyclosporine -erythromycin -ketoconazole -medicines to increase blood counts like filgrastim, pegfilgrastim, sargramostim -vaccines Talk to your doctor or health care professional before taking any of these  medicines: -acetaminophen -aspirin -ibuprofen -ketoprofen -naproxen This list may not describe all possible interactions. Give your health care provider a list of all the medicines, herbs, non-prescription drugs, or dietary supplements you use. Also tell them if you smoke, drink alcohol, or use illegal drugs. Some items may interact with your medicine. What should I watch for while using this medicine? Your condition will be monitored carefully while you are receiving this medicine. You will need important blood work done while you are taking this medicine. This drug may make you feel generally unwell. This is not uncommon, as chemotherapy can affect healthy cells as well as cancer cells. Report any side effects. Continue your course of treatment even though you feel ill unless your doctor tells you to stop. In some cases, you may be given additional medicines to help with side effects. Follow all directions for their use. Call your doctor or health care professional for advice if you get a fever, chills or sore throat, or other symptoms of a cold or flu. Do not treat yourself. This drug decreases your body's ability to fight infections. Try to avoid being around people who are sick. This medicine may increase your risk to bruise or bleed. Call your doctor or health care professional if you notice any unusual bleeding. Be careful brushing and flossing your teeth or using a toothpick because you may get an infection or bleed more easily. If you have any dental work done, tell your dentist you are receiving this medicine. Avoid taking products that contain aspirin, acetaminophen, ibuprofen, naproxen, or ketoprofen unless instructed by your doctor. These medicines may hide a fever. Do not become pregnant while taking this medicine. Women should inform their doctor if they wish to  become pregnant or think they might be pregnant. There is a potential for serious side effects to an unborn child. Talk to  your health care professional or pharmacist for more information. Do not breast-feed an infant while taking this medicine. What side effects may I notice from receiving this medicine? Side effects that you should report to your doctor or health care professional as soon as possible: -allergic reactions like skin rash, itching or hives, swelling of the face, lips, or tongue -low blood counts - This drug may decrease the number of white blood cells, red blood cells and platelets. You may be at increased risk for infections and bleeding. -signs of infection - fever or chills, cough, sore throat, pain or difficulty passing urine -signs of decreased platelets or bleeding - bruising, pinpoint red spots on the skin, black, tarry stools, nosebleeds -signs of decreased red blood cells - unusually weak or tired, fainting spells, lightheadedness -breathing problems -fast or irregular heartbeat -low blood pressure -mouth sores -nausea and vomiting -pain, swelling, redness or irritation at the injection site -pain, tingling, numbness in the hands or feet -swelling of the ankle, feet, hands -weight gain Side effects that usually do not require medical attention (report to your prescriber or health care professional if they continue or are bothersome): -bone pain -complete hair loss including hair on your head, underarms, pubic hair, eyebrows, and eyelashes -diarrhea -excessive tearing -changes in the color of fingernails -loosening of the fingernails -nausea -muscle pain -red flush to skin -sweating -weak or tired This list may not describe all possible side effects. Call your doctor for medical advice about side effects. You may report side effects to FDA at 1-800-FDA-1088. Where should I keep my medicine? This drug is given in a hospital or clinic and will not be stored at home. NOTE: This sheet is a summary. It may not cover all possible information. If you have questions about this medicine,  talk to your doctor, pharmacist, or health care provider.  2013, [employer]. (06/09/2008 11:52:10 AM)   Carboplatin injection What is this medicine? CARBOPLATIN (KAR boe pla tin) is a chemotherapy drug. It targets fast dividing cells, like cancer cells, and causes these cells to die. This medicine is used to treat ovarian cancer and many other cancers. This medicine may be used for other purposes; ask your health care provider or pharmacist if you have questions. What should I tell my health care provider before I take this medicine? They need to know if you have any of these conditions: -blood disorders -hearing problems -kidney disease -recent or ongoing radiation therapy -an unusual or allergic reaction to carboplatin, cisplatin, other chemotherapy, other medicines, foods, dyes, or preservatives -pregnant or trying to get pregnant -breast-feeding How should I use this medicine? This drug is usually given as an infusion into a vein. It is administered in a hospital or clinic by a specially trained health care professional. Talk to your pediatrician regarding the use of this medicine in children. Special care may be needed. Overdosage: If you think you have taken too much of this medicine contact a poison control center or emergency room at once. NOTE: This medicine is only for you. Do not share this medicine with others. What if I miss a dose? It is important not to miss a dose. Call your doctor or health care professional if you are unable to keep an appointment. What may interact with this medicine? -medicines for seizures -medicines to increase blood counts like filgrastim, pegfilgrastim, sargramostim -some  antibiotics like amikacin, gentamicin, neomycin, streptomycin, tobramycin -vaccines Talk to your doctor or health care professional before taking any of these medicines: -acetaminophen -aspirin -ibuprofen -ketoprofen -naproxen This list may not describe all  possible interactions. Give your health care provider a list of all the medicines, herbs, non-prescription drugs, or dietary supplements you use. Also tell them if you smoke, drink alcohol, or use illegal drugs. Some items may interact with your medicine. What should I watch for while using this medicine? Your condition will be monitored carefully while you are receiving this medicine. You will need important blood work done while you are taking this medicine. This drug may make you feel generally unwell. This is not uncommon, as chemotherapy can affect healthy cells as well as cancer cells. Report any side effects. Continue your course of treatment even though you feel ill unless your doctor tells you to stop. In some cases, you may be given additional medicines to help with side effects. Follow all directions for their use. Call your doctor or health care professional for advice if you get a fever, chills or sore throat, or other symptoms of a cold or flu. Do not treat yourself. This drug decreases your body's ability to fight infections. Try to avoid being around people who are sick. This medicine may increase your risk to bruise or bleed. Call your doctor or health care professional if you notice any unusual bleeding. Be careful brushing and flossing your teeth or using a toothpick because you may get an infection or bleed more easily. If you have any dental work done, tell your dentist you are receiving this medicine. Avoid taking products that contain aspirin, acetaminophen, ibuprofen, naproxen, or ketoprofen unless instructed by your doctor. These medicines may hide a fever. Do not become pregnant while taking this medicine. Women should inform their doctor if they wish to become pregnant or think they might be pregnant. There is a potential for serious side effects to an unborn child. Talk to your health care professional or pharmacist for more information. Do not breast-feed an infant while taking  this medicine. What side effects may I notice from receiving this medicine? Side effects that you should report to your doctor or health care professional as soon as possible: -allergic reactions like skin rash, itching or hives, swelling of the face, lips, or tongue -signs of infection - fever or chills, cough, sore throat, pain or difficulty passing urine -signs of decreased platelets or bleeding - bruising, pinpoint red spots on the skin, black, tarry stools, nosebleeds -signs of decreased red blood cells - unusually weak or tired, fainting spells, lightheadedness -breathing problems -changes in hearing -changes in vision -chest pain -high blood pressure -low blood counts - This drug may decrease the number of white blood cells, red blood cells and platelets. You may be at increased risk for infections and bleeding. -nausea and vomiting -pain, swelling, redness or irritation at the injection site -pain, tingling, numbness in the hands or feet -problems with balance, talking, walking -trouble passing urine or change in the amount of urine Side effects that usually do not require medical attention (report to your doctor or health care professional if they continue or are bothersome): -hair loss -loss of appetite -metallic taste in the mouth or changes in taste This list may not describe all possible side effects. Call your doctor for medical advice about side effects. You may report side effects to FDA at 1-800-FDA-1088. Where should I keep my medicine? This drug is given in  a hospital or clinic and will not be stored at home. NOTE: This sheet is a summary. It may not cover all possible information. If you have questions about this medicine, talk to your doctor, pharmacist, or health care provider.  2012, [employer]. (10/02/2007 2:38:05 PM)  Trastuzumab injection for infusion What is this medicine? TRASTUZUMAB (tras TOO zoo mab) is a monoclonal antibody. It targets a  protein called HER2. This protein is found in some stomach and breast cancers. This medicine can stop cancer cell growth. This medicine may be used with other cancer treatments. This medicine may be used for other purposes; ask your health care provider or pharmacist if you have questions. What should I tell my health care provider before I take this medicine? They need to know if you have any of these conditions: -heart disease -heart failure -infection (especially a virus infection such as chickenpox, cold sores, or herpes) -lung or breathing disease, like asthma -recent or ongoing radiation therapy -an unusual or allergic reaction to trastuzumab, benzyl alcohol, or other medications, foods, dyes, or preservatives -pregnant or trying to get pregnant -breast-feeding How should I use this medicine? This drug is given as an infusion into a vein. It is administered in a hospital or clinic by a specially trained health care professional. Talk to your pediatrician regarding the use of this medicine in children. This medicine is not approved for use in children. Overdosage: If you think you have taken too much of this medicine contact a poison control center or emergency room at once. NOTE: This medicine is only for you. Do not share this medicine with others. What if I miss a dose? It is important not to miss a dose. Call your doctor or health care professional if you are unable to keep an appointment. What may interact with this medicine? -cyclophosphamide -doxorubicin -warfarin This list may not describe all possible interactions. Give your health care provider a list of all the medicines, herbs, non-prescription drugs, or dietary supplements you use. Also tell them if you smoke, drink alcohol, or use illegal drugs. Some items may interact with your medicine. What should I watch for while using this medicine? Visit your doctor for checks on your progress. Report any side effects. Continue your  course of treatment even though you feel ill unless your doctor tells you to stop. Call your doctor or health care professional for advice if you get a fever, chills or sore throat, or other symptoms of a cold or flu. Do not treat yourself. Try to avoid being around people who are sick. You may experience fever, chills and shaking during your first infusion. These effects are usually mild and can be treated with other medicines. Report any side effects during the infusion to your health care professional. Fever and chills usually do not happen with later infusions. What side effects may I notice from receiving this medicine? Side effects that you should report to your doctor or other health care professional as soon as possible: -breathing difficulties -chest pain or palpitations -cough -dizziness or fainting -fever or chills, sore throat -skin rash, itching or hives -swelling of the legs or ankles -unusually weak or tired Side effects that usually do not require medical attention (report to your doctor or other health care professional if they continue or are bothersome): -loss of appetite -headache -muscle aches -nausea This list may not describe all possible side effects. Call your doctor for medical advice about side effects. You may report side effects to FDA  at 1-800-FDA-1088. Where should I keep my medicine? This drug is given in a hospital or clinic and will not be stored at home. NOTE: This sheet is a summary. It may not cover all possible information. If you have questions about this medicine, talk to your doctor, pharmacist, or health care provider.  2012, [employer]. (05/01/2009 1:43:15 PM)   Pegfilgrastim injection What is this medicine? PEGFILGRASTIM (peg fil GRA stim) helps the body make more white blood cells. It is used to prevent infection in people with low amounts of white blood cells following cancer treatment. This medicine may be used for other  purposes; ask your health care provider or pharmacist if you have questions. What should I tell my health care provider before I take this medicine? They need to know if you have any of these conditions: -sickle cell disease -an unusual or allergic reaction to pegfilgrastim, filgrastim, E.coli protein, other medicines, foods, dyes, or preservatives -pregnant or trying to get pregnant -breast-feeding How should I use this medicine? This medicine is for injection under the skin. It is usually given by a health care professional in a hospital or clinic setting. If you get this medicine at home, you will be taught how to prepare and give this medicine. Do not shake this medicine. Use exactly as directed. Take your medicine at regular intervals. Do not take your medicine more often than directed. It is important that you put your used needles and syringes in a special sharps container. Do not put them in a trash can. If you do not have a sharps container, call your pharmacist or healthcare provider to get one. Talk to your pediatrician regarding the use of this medicine in children. While this drug may be prescribed for children who weigh more than 45 kg for selected conditions, precautions do apply Overdosage: If you think you have taken too much of this medicine contact a poison control center or emergency room at once. NOTE: This medicine is only for you. Do not share this medicine with others. What if I miss a dose? If you miss a dose, take it as soon as you can. If it is almost time for your next dose, take only that dose. Do not take double or extra doses. What may interact with this medicine? -lithium -medicines for growth therapy This list may not describe all possible interactions. Give your health care provider a list of all the medicines, herbs, non-prescription drugs, or dietary supplements you use. Also tell them if you smoke, drink alcohol, or use illegal drugs. Some items may interact  with your medicine. What should I watch for while using this medicine? Visit your doctor for regular check ups. You will need important blood work done while you are taking this medicine. What side effects may I notice from receiving this medicine? Side effects that you should report to your doctor or health care professional as soon as possible: -allergic reactions like skin rash, itching or hives, swelling of the face, lips, or tongue -breathing problems -fever -pain, redness, or swelling where injected -shoulder pain -stomach or side pain Side effects that usually do not require medical attention (report to your doctor or health care professional if they continue or are bothersome): -aches, pains -headache -loss of appetite -nausea, vomiting -unusually tired This list may not describe all possible side effects. Call your doctor for medical advice about side effects. You may report side effects to FDA at 1-800-FDA-1088. Where should I keep my medicine? Keep out of  the reach of children. Store in a refrigerator between 2 and 8 degrees C (36 and 46 degrees F). Do not freeze. Keep in carton to protect from light. Throw away this medicine if it is left out of the refrigerator for more than 48 hours. Throw away any unused medicine after the expiration date. NOTE: This sheet is a summary. It may not cover all possible information. If you have questions about this medicine, talk to your doctor, pharmacist, or health care provider.  2012, [employer]. (01/28/2008 3:41:44 PM)

## 2012-06-13 NOTE — Progress Notes (Signed)
Checked in new pt with no financial concerns. °

## 2012-06-13 NOTE — Progress Notes (Signed)
Melissa [name] [id number] Feb 24, 1966 46 y.o. 06/13/2012 10:10 AM  CC  Benita Stabile, MD [facility] [address] [place] [address] Dr. Orpha Bur growth Dr. 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   STAGE:   Cancer of upper-inner quadrant of female breast   Primary site: Breast (Left)   Staging method: AJCC 7th Edition   Clinical: Stage IIA (T2, N0, cM0)   Summary: Stage IIA (T2, N0, cM0)  REFERRING PHYSICIAN: Dr. Sharlet Salina Hoxworth  HISTORY OF PRESENT ILLNESS:  Melissa [name] is a 46 y.o. female.  With medical history significant for coccidiomycosis and bronchiectasis and she is on chronic antibiotics for this. Patient about a month ago felt a lump in her left breast. Her last mammogram was back in 2006. After feeling the left breast mass patient went on to have a mammogram performed that confirmed the mass. Subsequent ultrasound showed a 2.6 cm mass at the 12:00 position. Patient underwent a needle core biopsy of the breast mass that showed invasive ductal carcinoma grade 3 estrogen receptor positive progesterone receptor positive HER-2/neu positive with amplification of 2.91. Ki-67 was elevated at 70%. Patient went on to have MRI of the breasts performed the MRI showed a 3.1 cm solitary mass. No lymphadenopathy was noted and no satellite lesions. She is now seen in the multidisciplinary breast clinic for discussion of treatment options. Her case was discussed at the multidisciplinary breast conference and her pathology and radiology were reviewed. She is today accompanied by her mother father husband to discuss her treatment options. She was seen by Dr. Chipper Herb and Dr. Glenna Fellows.   Past Medical History: Past Medical History  Diagnosis Date  . Asthma   . Breast cancer   . Headache   . Back pain     Past Surgical History: Past Surgical History  Procedure Date  . 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 Cesarean section 1995/1996  . Lobectomy 1997    upper left    Family History: Family History  Problem Relation Age of Onset  . Cancer Mother     ? type maybe skin CA  . Emphysema Mother     was a smoker  . Heart disease Mother   . Asthma Brother     Social History History  Substance Use Topics  . Smoking status: Current Every Day Smoker -- 1.0 packs/day for 24 years    Types: Cigarettes  . Smokeless tobacco: Never Used  . Alcohol Use: No    Allergies: Allergies  Allergen Reactions  . Aspirin     Unknown reaction  . Iodinated Diagnostic Agents Hives    Pt states in [place] got hives and itching with IV contrast. Per pt allergy was to prev CT.    Current Medications: Current Outpatient Prescriptions  Medication Sig Dispense Refill  . amoxicillin (AMOXIL) 500 MG capsule Take 500 mg by mouth 3 (three) times daily.      Marland Kitchen acetaminophen-codeine (TYLENOL #3) 300-30 MG per tablet Take 1 tablet by mouth every 4 (four) hours as needed.      Marland Kitchen albuterol (VENTOLIN HFA) 108 (90 BASE) MCG/ACT inhaler Inhale 2 puffs into the lungs every 6 (six) hours as needed.        Marland Kitchen  etodolac (LODINE) 400 MG tablet Take 1 tablet by mouth Twice daily as needed.      . traMADol (ULTRAM) 50 MG tablet 1 to 2 every 4 hours as needed       No current facility-administered medications for this visit.   Facility-Administered Medications Ordered in Other Visits  Medication Dose Route Frequency Provider Last Rate Last Dose  . [COMPLETED] gadobenate dimeglumine (MULTIHANCE) injection 9 mL  9 mL Intravenous Once PRN Medication Radiologist, MD   9 mL at 06/12/12 1222    OB/GYN History: Menarche at age 47 patient is  premenopausal last menstrual cycle was on 05/26/2012 she's had 2 term births first live birth was at 6.  Fertility Discussion: Patient has completed her family Prior History of Cancer: No prior history of cancers  Health Maintenance:  Colonoscopy no Bone Density no Last PAP 2006  ECOG PERFORMANCE STATUS: 1 - Symptomatic but completely ambulatory  Genetic Counseling/testing: Due to patient's family history for having a second maternal cousin with breast cancer at the age of 68 and patient herself with breast cancer at 61 she will be referred to genetic counseling and testing  REVIEW OF SYSTEMS:  A comprehensive review of systems was negative.  PHYSICAL EXAMINATION: Blood pressure 108/74, pulse 76, temperature 98.6 F (37 C), temperature source Oral, resp. rate 20, height 5\' 2"  (1.575 [name]), weight 114 lb 12.8 oz (52.073 kg).  EXB:MWUXL, healthy, no distress, well nourished and well developed SKIN: skin color, texture, turgor are normal HEAD: Normocephalic EYES: PERRLA, EOMI EARS: External ears normal OROPHARYNX:no exudate, lips, buccal mucosa, and tongue normal and the patient had significant caries of the teeth  NECK: supple, no adenopathy LYMPH:  no palpable lymphadenopathy BREAST: Left breast reveals a palpable mass measuring about 3 cm no axillary lymphadenopathy no nipple retraction ecchymosis and bruising is seen from her recent biopsy LUNGS: coarse sounds heard, and rales HEART: regular rate & rhythm and no murmurs ABDOMEN:abdomen soft, non-tender, normal bowel sounds and no masses or organomegaly BACK: Back symmetric, no curvature., No CVA tenderness EXTREMITIES:less then 2 second capillary refill  NEURO: alert & oriented x 3 with fluent speech, no focal motor/sensory deficits, gait normal, reflexes normal and symmetric     STUDIES/RESULTS: US Breast Left  Jun 09, 2012  *RADIOLOGY REPORT*  Clinical Data:  The patient feels a mass in the upper portion of the left  breast.   She had a benign excisional biopsy of the right breast in 2006.  She had a left upper lobectomy due to coccidioidomycosis in 1998.  DIGITAL DIAGNOSTIC BILATERAL MAMMOGRAM WITH CAD AND LEFT BREAST ULTRASOUND:  Comparison:  None.  Findings:  The breast tissue is heterogeneously dense.  There is an irregular mass with suspicious microcalcifications in the upper portion of the left breast.  No abnormality is noted on the right. Mammographic images were processed with CAD.  On physical exam, I palpate a 3 cm firm mass at 12 o'clock 4 cm from the left nipple.  Ultrasound is performed, showing an irregular hypoechoic mass with microcalcifications at 12 o'clock 4 cm from the left nipple measuring 2.5 x 2.6 x 1.5 cm.  Sonography of the left axilla demonstrates a prominent but not definitely abnormal axillary lymph node.  The appearance is highly suspicious for invasive mammary carcinoma. Biopsy is recommended.  Ultrasound-guided core needle biopsy was discussed with the patient and she agreed with this plan.  IMPRESSION: Highly suspicious mass at 12 o'clock 4 cm from the left nipple measuring 2.5 x 2.6  x 1.5 cm.  RECOMMENDATION: Ultrasound-guided core needle biopsy is recommended.  This will be performed and reported separately.  I have discussed the findings and recommendations with the patient. Results were also provided in writing at the conclusion of the visit.  BI-RADS CATEGORY 5:  Highly suggestive of malignancy - appropriate action should be taken.   Original Report Authenticated By: Cain Saupe, [name].D.    Mr Breast Bilateral W Wo Contrast  06/12/2012  Normal *RADIOLOGY REPORT*  Clinical Data: Recently diagnosed left breast invasive mammary carcinoma.  Preoperative evaluation.  BILATERAL BREAST MRI WITH AND WITHOUT CONTRAST  Technique: Multiplanar, multisequence MR images of both breasts were obtained prior to and following the intravenous administration of 9ml of Multihance.  Three dimensional images  were evaluated at the independent DynaCad workstation.  Comparison:  06/05/2012 mammogram and ultrasound.  Findings: There is marked diffuse parenchymal background enhancement.  There is an irregular enhancing mass located within the middle one third of the left breast at the 12 o'clock position with central clip artifact corresponding to the recently diagnosed left breast invasive mammary carcinoma.  The mass measures 3.1 x 2.4 x 1.7 cm in size and is associated with a mixture of plateau and washout enhancement kinetics.  There are no additional worrisome enhancing foci within either breast.  There is no evidence for axillary or internal mammary adenopathy and there are no additional findings.  IMPRESSION: 3.1 cm irregular enhancing mass located within the left breast at 12 o'clock position corresponding to the recently diagnosed left breast invasive mammary carcinoma.  No evidence for adenopathy and no additional findings.  RECOMMENDATION:  Treatment plan  THREE-DIMENSIONAL MR IMAGE RENDERING ON INDEPENDENT WORKSTATION:  Three-dimensional MR images were rendered by post-processing of the original MR data on an independent workstation.  The three- dimensional MR images were interpreted, and findings were reported in the accompanying complete MRI report for this study.  BI-RADS CATEGORY 6:  Known biopsy-proven malignancy - appropriate action should be taken.   Original Report Authenticated By: Rolla Plate, [name].D.    [name] Core Biopsy  06/05/2012  *RADIOLOGY REPORT*  Clinical Data:  Palpable mass at 12 o'clock 4 cm from the left nipple.  ULTRASOUND GUIDED VACUUM ASSISTED CORE BIOPSY OF THE LEFT BREAST  The patient and I discussed the procedure of ultrasound-guided biopsy, including benefits and alternatives.  We discussed the high likelihood of a successful procedure. We discussed the risks of the procedure including infection, bleeding, tissue injury, clip migration, and inadequate sampling.  Informed written  consent was given.  Using sterile technique, 2% lidocaine, 2% lidocaine with epinephrine, ultrasound guidance, and a 12 gauge vacuum assisted needle, biopsy was performed of the mass at 12 o'clock 4 cm from the left nipple using a lateromedial approach.  At the conclusion of the procedure, a ribbon tissue marker clip was deployed into the biopsy cavity.  Follow-up 2-view mammogram was performed and dictated separately.  IMPRESSION: Ultrasound-guided biopsy of the mass at 12 o'clock 4 cm from the left nipple.  No apparent complications.   Original Report Authenticated By: Cain Saupe, [name].D.    Mm Digital Diagnostic Bilat  06/05/2012  *RADIOLOGY REPORT*  Clinical Data:  The patient feels a mass in the upper portion of the left breast.   She had a benign excisional biopsy of the right breast in 2006.  She had a left upper lobectomy due to coccidioidomycosis in 1998.  DIGITAL DIAGNOSTIC BILATERAL MAMMOGRAM WITH CAD AND LEFT BREAST ULTRASOUND:  Comparison:  None.  Findings:  The breast tissue is heterogeneously dense.  There is an irregular mass with suspicious microcalcifications in the upper portion of the left breast.  No abnormality is noted on the right. Mammographic images were processed with CAD.  On physical exam, I palpate a 3 cm firm mass at 12 o'clock 4 cm from the left nipple.  Ultrasound is performed, showing an irregular hypoechoic mass with microcalcifications at 12 o'clock 4 cm from the left nipple measuring 2.5 x 2.6 x 1.5 cm.  Sonography of the left axilla demonstrates a prominent but not definitely abnormal axillary lymph node.  The appearance is highly suspicious for invasive mammary carcinoma. Biopsy is recommended.  Ultrasound-guided core needle biopsy was discussed with the patient and she agreed with this plan.  IMPRESSION: Highly suspicious mass at 12 o'clock 4 cm from the left nipple measuring 2.5 x 2.6 x 1.5 cm.  RECOMMENDATION: Ultrasound-guided core needle biopsy is recommended.  This  will be performed and reported separately.  I have discussed the findings and recommendations with the patient. Results were also provided in writing at the conclusion of the visit.  BI-RADS CATEGORY 5:  Highly suggestive of malignancy - appropriate action should be taken.   Original Report Authenticated By: Cain Saupe, [name].D.    Mm Digital Diagnostic Unilat L  06/05/2012  *RADIOLOGY REPORT*  Clinical Data:  Ultrasound-guided core needle biopsy of a mass at 12 o'clock 4 cm from the left nipple with clip placement.  DIGITAL DIAGNOSTIC LEFT MAMMOGRAM  Comparison:  None.  Findings:  Films are performed following ultrasound guided biopsy of a mass at 12 o'clock 4 cm from the left nipple.  The ribbon clip is appropriately positioned.  IMPRESSION: Appropriate clip placement following ultrasound-guided core needle biopsy of a mass at 12 o'clock 4 cm from the left nipple.   Original Report Authenticated By: Cain Saupe, [name].D.    Mm Radiologist Eval And Mgmt  06/06/2012  *RADIOLOGY REPORT*  ESTABLISHED PATIENT OFFICE VISIT - LEVEL II [phone])  Chief Complaint:  The patient presented with a palpable left breast mass.  History:  Ultrasound guided core biopsy of the left breast mass was performed on 06/05/2012.  Exam:  The wound site is clean and dry with no signs of inflammation or hematoma.  Results of the biopsy were discussed with the patient.  Pathology: Invasive ductal carcinoma was reported histologically. This corresponds well with the imaging findings.  Assessment and Plan:  The patient will be seen at the Multidisciplinary Clinic on 06/13/2012.  Breast MRI has been scheduled.  Informational reading material was provided to the patient.   Original Report Authenticated By: Baird Lyons, [name].D.      LABS:    Chemistry      Component Value Date/Time   NA 141 01/26/2011 1039   K 4.5 01/26/2011 1039   CL 106 01/26/2011 1039   CO2 28 01/26/2011 1039   BUN 7 01/26/2011 1039   CREATININE 0.6 01/26/2011 1039       Component Value Date/Time   CALCIUM 9.2 01/26/2011 1039      Lab Results  Component Value Date   WBC 9.1 06/13/2012   HGB 14.5 06/13/2012   HCT 41.9 06/13/2012   MCV 96.3 06/13/2012   PLT 285 06/13/2012       PATHOLOGY: ADDITIONAL INFORMATION: PROGNOSTIC INDICATORS - ACIS Results IMMUNOHISTOCHEMICAL AND MORPHOMETRIC ANALYSIS BY THE AUTOMATED CELLULAR IMAGING SYSTEM (ACIS) Estrogen Receptor (Negative, <1%): 99%, STRONG STAINING INTENSITY Progesterone Receptor (Negative, <1%): 98%, STRONG STAINING INTENSITY  Proliferation Marker Ki67 by [name] IB-1 (Low<20%): 72% All controls stained appropriately Jimmy Picket MD Pathologist, Electronic Signature ( Signed 06/13/2012) CHROMOGENIC IN-SITU HYBRIDIZATION Interpretation: HER2/NEU BY CISH - SHOWS AMPLIFICATION BY CISH ANALYSIS. THE RATIO OF HER2: CEP 17 SIGNALS WAS 2.91 Reference range: Ratio: HER2:CEP17 < 1.8 gene amplification not observed Ratio: HER2:CEP 17 1.8-2.2 - equivocal result Ratio: HER2:CEP17 > 2.2 - gene amplification observed Jimmy Picket MD Pathologist, Electronic Signature ( Signed 06/13/2012) 1 of 2 FINAL for [facility], Melissa [name] ([id number]) FINAL DIAGNOSIS Diagnosis Breast, left, needle core biopsy, 12 o'clock, 4 cm / nipple - INVASIVE DUCTAL CARCINOMA. - DUCTAL CARCINOMA IN SITU. - SEE COMMENT. Microscopic Comment The carcinoma is grade III. A breast prognostic profile will be performed and the results reported separately. The results were called to the [facility] on 06/06/2012. (JBK:kh 06/06/12) Pecola Leisure MD Pathologist, Electronic Signature (Case signed 06/06/2012) Specimen Gross and Clinical Information Specimen Comment 2.6 cm palpable mass left brest, 12 o'clock, 4 cm / nipple; HX left upper lobectomy for coccidioidomycosis in 1998 In formalin 2:50, extracted <1 min Specimen(s) Obtained: Breast, left, needle core biopsy, 12 o'clock, 4 cm / nipple Specimen Clinical Information Highly  suspicious for invasive carcinoma Gross Received in formalin are two   ASSESSMENT    46 year old female with  #1 clinical stage II a invasive ductal carcinoma, grade 3, ER positive, PR positive, HER-2/neu positive with elevated Ki-67 of 70%. Breast tumor ratio would not allow a lumpectomy of front and there forneoadjuvant chemotherapy was recommended as patient is interested in breast conservation. I recommended Taxotere carboplatinum and Herceptin given every 21 days for a total of 6 cycles of Taxotere carboplatinum. Herceptin would be given on a weekly basis initially been every 3 weeks to complete out 1 year of Herceptin therapy. Because patient's tumor is estrogen receptor positive she would be a candidate for tamoxifen and I recommended 10 years of tamoxifen very  #2 we discussed genetic counseling and testing due to family history and patient's young age of onset of breast cancer she is a good candidate for genetic counseling and testing for BRCA1 and 2 gene mutation.  #3 in order to give her chemotherapy we will need to proceed with getting a Port-A-Cath placed we will also need echocardiogram chemotherapy teaching class. She will also be referred to Dr. Teressa Lower for followup with cardiology during her treatment with Herceptin.  #4 risks and benefits of therapy were discussed with her in great detail. I hope is to get her started on her chemotherapy either before or after Christmas.  Clinical Trial Eligibility: No Multidisciplinary conference discussion yes    PLAN:    #1 proceed with placement of Port-A-Cath for infusion of chemotherapy  #2 we will get echocardiogram as well as chemotherapy teaching class set up for her.  #3 once patient completes all of her neoadjuvant TCH she will then proceed with having an MRI of the breasts performed to evaluate response to therapy followed by lumpectomy with sentinel lymph node biopsy if possible.  #4 patient will be referred to genetic  counseling or testing of comprehensive BRCA1 and 2 gene analysis.  #5 she does an active smoker I we did discuss smoking cessation     Discussion: Patient is being treated per NCCN breast cancer care guidelines appropriate for stage.II   Thank you so much for allowing me to participate in the care of Melissa [name]. I will continue to follow up the patient with you and assist in  her care.  All questions were answered. The patient knows to call the clinic with any problems, questions or concerns. We can certainly see the patient much sooner if necessary.  I spent 60 minutes counseling the patient face to face. The total time spent in the appointment was 60 minutes.   Drue Second, MD Medical/Oncology [facility] [phone] (beeper) [phone] (Office)  06/13/2012, 10:10 AM

## 2012-06-13 NOTE — Telephone Encounter (Signed)
Pr staff message and {POF I have scheduled appt.  Melissa [name]

## 2012-06-13 NOTE — Progress Notes (Signed)
Subjective:   new diagnosis left breast cancer  Patient ID: Melissa [name], female   DOB: 08/21/1965, 46 y.o.   MRN: [id number]  HPI Patient is a pleasant 46-year-old female referred by Dr. Jackson at the breast center for a new diagnosis of left breast cancer. The patient has a history of a benign right breast biopsy 2006. This was the last time she had a mammogram. She recently was able to feel a lump in the upper inner left breast. She subsequently presented to the breast center for evaluation. I have personally reviewed her imaging. Mammogram shows bilaterally dense breast tissue but there is an irregular mass in the upper inner left breast. Ultrasound revealed an irregular hypoechoic mass measuring 2.6 cm. There were no definitely abnormal nodes in the axilla. Subsequent large core biopsy was performed. 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 Subsequent rest MRI was performed which I've reviewed. This reveals a 3.1 cm mass corresponding to the known malignancy with no additional worrisome lesions. She has not had any unusual pain. No skin changes or nipple discharge.  Past Medical History  Diagnosis Date  . Asthma   . Breast cancer   . Headache   . Back pain    Past Surgical History  Procedure Date  . 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 Cesarean section 1995/1996  . Lobectomy 1997    upper left   Current Outpatient Prescriptions  Medication Sig Dispense Refill  . acetaminophen-codeine (TYLENOL #3) 300-30 MG per tablet Take 1 tablet by mouth every 4 (four) hours as needed.      . albuterol (VENTOLIN HFA) 108 (90 BASE) MCG/ACT inhaler Inhale 2 puffs into the lungs every 6 (six) hours as needed.        . amoxicillin (AMOXIL) 500 MG capsule Take 500 mg by mouth 3 (three) times daily.      . etodolac (LODINE) 400 MG tablet Take 1 tablet by mouth Twice daily as needed.      . traMADol (ULTRAM) 50 MG tablet 1 to 2 every  4 hours as needed       No current facility-administered medications for this visit.   Facility-Administered Medications Ordered in Other Visits  Medication Dose Route Frequency Provider Last Rate Last Dose  . [COMPLETED] gadobenate dimeglumine (MULTIHANCE) injection 9 mL  9 mL Intravenous Once PRN Medication Radiologist, MD   9 mL at 06/12/12 1222   Allergies  Allergen Reactions  . Aspirin     Unknown reaction  . Iodinated Diagnostic Agents Hives    Pt states in [place] got hives and itching with IV contrast. Per pt allergy was to prev CT.   History  Substance Use Topics  . Smoking status: Current Every Day Smoker -- 1.0 packs/day for 24 years    Types: Cigarettes  . Smokeless tobacco: Never Used  . Alcohol Use: No     Review of Systems  Constitutional: Negative.   HENT: Negative.   Respiratory: Positive for shortness of breath and wheezing.   Cardiovascular: Negative.   Gastrointestinal: Negative.   Neurological: Negative.   Psychiatric/Behavioral: Negative.        Objective:   Physical Exam General: Alert, Thin well-appearing Caucasian female, in no distress Skin: Warm and dry without rash or infection. HEENT: No palpable masses or thyromegaly. Sclera nonicteric. Pupils equal round and reactive. Oropharynx clear. Lymph nodes: No cervical, supraclavicular, or inguinal nodes palpable.   Breasts: Fairly small breasts bilaterally. There is an easily palpable firm freely movable mass in the upper inner quadrant of the left breast. No other masses palpable in either breast. No skin changes or nipple inversion or discharge. Lungs: Breath sounds clear and equal without increased work of breathing Cardiovascular: Regular rate and rhythm without murmur. No JVD or edema. Peripheral pulses intact. Abdomen: Nondistended. Soft and nontender. No masses palpable. No organomegaly. No palpable hernias. Extremities: No edema or joint swelling or deformity. No chronic venous  stasis changes. Neurologic: Alert and fully oriented.    Assessment:     New diagnosis of a 3 cm high-grade triple positive cancer of the left breast. Her tumor is large relative to breast size. She will require chemotherapy and I believe that neoadjuvant treatment could significantly improve her chances of successful breast conservation which is her goal. After discussion with Dr. Kahn she is in agreement and we will plan neoadjuvant chemotherapy with a goal of breast conservation. We discussed Port-A-Cath placement including its nature and indications and risks of anesthetic complications, bleeding, infection, pneumothorax, catheter displacement or malfunction and thrombosis. We will schedule this for her in the near future.    Plan:     Port-A-Cath placement as an outpatient with neoadjuvant chemotherapy.      

## 2012-06-13 NOTE — Telephone Encounter (Signed)
Made patient appointment for echo and dr, bensinhom  Made patient appointment for two weeks with lab md treatment  Sent michelle email to set up patient's treatment  Mad patient appointment for chemo education class per orders from 06-13-2012

## 2012-06-14 ENCOUNTER — Encounter: Payer: Self-pay | Admitting: *Deleted

## 2012-06-14 NOTE — Progress Notes (Signed)
[facility] Psychosocial Distress Screening  Clinical Social Work   Patient completed distress screening protocol, and scored a 4 on the Psychosocial Distress Thermometer which indicates mild distress. Clinical Social Worker met with patient and pt's family in [facility] to assess for distress and other psychosocial needs. Pt stated her distress level was much lower after speaking with the physicians and getting information on her treatment plan. CSW provided information on the [facility] support team and support services. CSW encouraged pt or family to call with any questions or concerns.    Tamala Julian, MSW, LCSW  Clinical Social Worker  [facility]  [phone]

## 2012-06-18 ENCOUNTER — Encounter: Payer: Self-pay | Admitting: Oncology

## 2012-06-18 ENCOUNTER — Telehealth: Payer: Self-pay | Admitting: *Deleted

## 2012-06-18 NOTE — Telephone Encounter (Signed)
Spoke to pt concerning [facility] from 06/13/12.  Pt denies questions or concerns regarding dx or treatment care plan.  Confirmed port placement date and future appts.  Encourage pt to call with needs.  Received verbal understanding.  Contact information given.

## 2012-06-18 NOTE — Progress Notes (Signed)
06/18/2012  Cancer Claim forms given to Dr. Santo Held nurse to be signed and patient to pick up tomorrow.    Bonita Quin [phone]

## 2012-06-19 ENCOUNTER — Other Ambulatory Visit: Payer: BC Managed Care – PPO

## 2012-06-19 ENCOUNTER — Encounter: Payer: Self-pay | Admitting: *Deleted

## 2012-06-20 ENCOUNTER — Telehealth (INDEPENDENT_AMBULATORY_CARE_PROVIDER_SITE_OTHER): Payer: Self-pay

## 2012-06-20 ENCOUNTER — Telehealth: Payer: Self-pay | Admitting: Oncology

## 2012-06-20 NOTE — Telephone Encounter (Signed)
Reviewed patient medical history with Dr. Johna Sheriff, patient okay for [facility] Placement and will not need medical or pulmonary clearance.  Thank you,  Cymone Yeske ===View-only below this line===  ----- Message -----    From: Littie Deeds    Sent: 06/13/2012   1:21 PM      To: Mariella Saa, MD, Maryan Puls, CMA  This pt is scheduled or PAC placement on 12/23.  She has h/o left upper lobectomy in 1997 and is being followed by Dr. Sherene Sires or pulmonology. Would you like this pt to get pulmonology clearance?

## 2012-06-20 NOTE — Telephone Encounter (Signed)
S/w the pt and she is aware of her cancelled dec 18th appt that has been changed to 06/26/2012

## 2012-06-25 ENCOUNTER — Encounter (HOSPITAL_COMMUNITY): Payer: Self-pay

## 2012-06-25 ENCOUNTER — Ambulatory Visit (HOSPITAL_COMMUNITY)
Admission: RE | Admit: 2012-06-25 | Discharge: 2012-06-25 | Disposition: A | Discharge status: Home / Self Care | Payer: BC Managed Care – PPO | Source: Ambulatory Visit | Attending: Oncology | Admitting: Oncology

## 2012-06-25 ENCOUNTER — Ambulatory Visit (HOSPITAL_BASED_OUTPATIENT_CLINIC_OR_DEPARTMENT_OTHER)
Admission: RE | Admit: 2012-06-25 | Discharge: 2012-06-25 | Disposition: A | Discharge status: Home / Self Care | Payer: BC Managed Care – PPO | Source: Ambulatory Visit | Attending: Internal Medicine | Admitting: Internal Medicine

## 2012-06-25 VITALS — BP 106/62 | HR 82 | Ht 62.0 in | Wt 116.1 lb

## 2012-06-25 DIAGNOSIS — D4989 Neoplasm of unspecified behavior of other specified sites: Secondary | ICD-10-CM

## 2012-06-25 DIAGNOSIS — F17200 Nicotine dependence, unspecified, uncomplicated: Secondary | ICD-10-CM

## 2012-06-25 DIAGNOSIS — C50219 Malignant neoplasm of upper-inner quadrant of unspecified female breast: Secondary | ICD-10-CM

## 2012-06-25 NOTE — Assessment & Plan Note (Addendum)
Have discussed need for cessation.  She will also schedule her PFTs with Dr. Sherene Sires.   Patient seen and examined with Ulyess Blossom, PA-C. We discussed all aspects of the encounter. I agree with the assessment and plan as stated above.  Agree. She has mild wheezing on exam. Explained need to f/u with Dr. Sherene Sires.

## 2012-06-25 NOTE — Assessment & Plan Note (Addendum)
Have discussed the role of the cardio-onc clinic.  Have discussed risk of cardiotoxicity and that she has a 10% change of cardiotoxicity.  Echo has been reviewed with patient and she has been cleared to start chemotherapy.  Have discussed signs and symptoms of HF and she will call the clinic if these present themselves.    Patient seen and examined with Ulyess Blossom, PA-C. We discussed all aspects of the encounter. I agree with the assessment and plan as stated above.  I reviewed her echo personally in clinic. Role of cardio-oncology clinic and risk of Herceptin cardiotoxicity explained in detail. Discussed signs/sx of HF to watch out for. Will see every 3 months with echo.

## 2012-06-25 NOTE — Progress Notes (Signed)
Referring Physician: Dr. Welton Flakes Primary Care:  Primary Cardiologist: Pulmonologist: Dr. Sherene Sires   HPI: Melissa [name] is a 46 y.o. female referred by Dr. Welton Flakes for enrollment in the cardio-onc clinic.  She is from [place] and has history of coccidiomycosis s/p left upper lobectomy in 1997 and bronchiectasis which she is on chronic abx therapy, followed by Dr. Sherene Sires.  She was diagnosed with Stage IIA invasive left sided breast cancer that was found to be ER/PR and Her-2/neu positive with Ki-67 elevated at 70%.  She also continues to smoke cigarettes but this has gone from 1-1.5 ppd to 5-6 cigs/day.    She will start herceptin therapy Jan 2nd with carboplatinum, herceptin and taxotere q3 weeks.  She is here with her daughter.  She has chronic dyspnea with steps that is baseline.  Denies orthopnea/PND.  No edema.  No chest pain.  +cough  Echo:  06/25/12:  LVEF 55-60%, lat s' 11.3   Review of Systems: [y] = yes, [ ]  = no   General: Weight gain [ ] ; Weight loss [ ] ; Anorexia [ ] ; Fatigue [ ] ; Fever [ ] ; Chills [ ] ; Weakness [ ]   Cardiac: Chest pain/pressure [ ] ; Resting SOB [ ] ; Exertional SOB [ ] ; Orthopnea [ ] ; Pedal Edema [ ] ; Palpitations [ ] ; Syncope [ ] ; Presyncope [ ] ; Paroxysmal nocturnal dyspnea[ ]   Pulmonary: Cough [ y]; Wheezing[ ] ; Hemoptysis[ ] ; Sputum [ ] ; Snoring [ ]   GI: Vomiting[ ] ; Dysphagia[ ] ; Melena[ ] ; Hematochezia [ ] ; Heartburn[ ] ; Abdominal pain [ ] ; Constipation [ ] ; Diarrhea [ ] ; BRBPR [ ]   GU: Hematuria[ ] ; Dysuria [ ] ; Nocturia[ ]   Vascular: Pain in legs with walking [ ] ; Pain in feet with lying flat [ ] ; Non-healing sores [ ] ; Stroke [ ] ; TIA [ ] ; Slurred speech [ ] ;  Neuro: Headaches[ ] ; Vertigo[ ] ; Seizures[ ] ; Paresthesias[ ] ;Blurred vision [ ] ; Diplopia [ ] ; Vision changes [ ]   Ortho/Skin: Arthritis [ ] ; Joint pain [ ] ; Muscle pain [ ] ; Joint swelling [ ] ; Back Pain [ ] ; Rash [ ]   Psych: Depression[ ] ; Anxiety[ ]   Heme: Bleeding problems [ ] ; Clotting disorders [ ] ; Anemia [  ]  Endocrine: Diabetes [ ] ; Thyroid dysfunction[ ]    Past Medical History  Diagnosis Date  . Asthma   . Breast cancer   . Headache   . Back pain     Current Outpatient Prescriptions  Medication Sig Dispense Refill  . amoxicillin (AMOXIL) 500 MG capsule Take 500 mg by mouth 3 (three) times daily.      Marland Kitchen acetaminophen-codeine (TYLENOL #3) 300-30 MG per tablet Take 1 tablet by mouth every 4 (four) hours as needed.      Marland Kitchen albuterol (VENTOLIN HFA) 108 (90 BASE) MCG/ACT inhaler Inhale 2 puffs into the lungs every 6 (six) hours as needed.        . etodolac (LODINE) 400 MG tablet Take 1 tablet by mouth Twice daily as needed.      . traMADol (ULTRAM) 50 MG tablet 1 to 2 every 4 hours as needed        Allergies  Allergen Reactions  . Aspirin     Unknown reaction  . Iodinated Diagnostic Agents Hives    Pt states in [place] got hives and itching with IV contrast. Per pt allergy was to prev CT.    History   Social History  . Marital Status: Married    Spouse Name: N/A    Number  of Children: 2  . Years of Education: N/A   Occupational History  . Property Manager    Social History Main Topics  . Smoking status: Current Every Day Smoker -- 1.0 packs/day for 24 years    Types: Cigarettes  . Smokeless tobacco: Never Used  . Alcohol Use: No  . Drug Use: No  . Sexually Active: Yes   Other Topics Concern  . Not on file   Social History Narrative  . No narrative on file    Family History  Problem Relation Age of Onset  . Cancer Mother     ? type maybe skin CA  . Emphysema Mother     was a smoker  . Heart disease Mother   . Asthma Brother   Maternal grandfather - MI at 26 Uncles - Pacers Mother - CAD  PHYSICAL EXAM: Filed Vitals:   06/25/12 0953  BP: 106/62  Pulse: 82  Height: 5\' 2"  (1.575 m)  Weight: 116 lb 1.9 oz (52.672 kg)  SpO2: 98%    General:  Well appearing. No respiratory difficulty HEENT: normal Neck: supple. no JVD. Carotids 2+ bilat;  no bruits. No lymphadenopathy or thryomegaly appreciated. Cor: PMI nondisplaced. Regular rate & rhythm. No rubs, gallops or murmurs. Lungs: coarse breath sounds Abdomen: soft, nontender, nondistended. No hepatosplenomegaly. No bruits or masses. Good bowel sounds. Extremities: no cyanosis, clubbing, rash, edema Neuro: alert & oriented x 3, cranial nerves grossly intact. moves all 4 extremities w/o difficulty. Affect pleasant.   ASSESSMENT & PLAN:

## 2012-06-25 NOTE — Progress Notes (Signed)
  Echocardiogram 2D Echocardiogram has been performed.  Melissa [name] 06/25/2012, 9:44 AM

## 2012-06-25 NOTE — Patient Instructions (Signed)
Stop smoking.  Follow up 3 months with echo.

## 2012-06-26 ENCOUNTER — Other Ambulatory Visit (HOSPITAL_BASED_OUTPATIENT_CLINIC_OR_DEPARTMENT_OTHER): Payer: BC Managed Care – PPO | Admitting: Lab

## 2012-06-26 ENCOUNTER — Ambulatory Visit (HOSPITAL_BASED_OUTPATIENT_CLINIC_OR_DEPARTMENT_OTHER): Payer: BC Managed Care – PPO | Admitting: Oncology

## 2012-06-26 ENCOUNTER — Encounter: Payer: Self-pay | Admitting: Oncology

## 2012-06-26 ENCOUNTER — Telehealth: Payer: Self-pay | Admitting: *Deleted

## 2012-06-26 VITALS — BP 101/66 | HR 71 | Temp 98.6°F | Resp 20 | Ht 62.0 in | Wt 117.4 lb

## 2012-06-26 DIAGNOSIS — C50919 Malignant neoplasm of unspecified site of unspecified female breast: Secondary | ICD-10-CM

## 2012-06-26 DIAGNOSIS — Z170 Estrogen receptor positive status [ER+]: Secondary | ICD-10-CM

## 2012-06-26 DIAGNOSIS — C50219 Malignant neoplasm of upper-inner quadrant of unspecified female breast: Secondary | ICD-10-CM

## 2012-06-26 LAB — CBC WITH DIFFERENTIAL/PLATELET
Basophils Absolute: 0.2 10*3/uL — ABNORMAL HIGH (ref 0.0–0.1)
EOS%: 7.5 % — ABNORMAL HIGH (ref 0.0–7.0)
Eosinophils Absolute: 0.6 10*3/uL — ABNORMAL HIGH (ref 0.0–0.5)
HCT: 37.9 % (ref 34.8–46.6)
HGB: 13 g/dL (ref 11.6–15.9)
LYMPH%: 30 % (ref 14.0–49.7)
MCH: 33 pg (ref 25.1–34.0)
MCV: 96.1 fL (ref 79.5–101.0)
MONO%: 6 % (ref 0.0–14.0)
NEUT%: 54.4 % (ref 38.4–76.8)
Platelets: 310 10*3/uL (ref 145–400)

## 2012-06-26 LAB — COMPREHENSIVE METABOLIC PANEL (CC13)
AST: 15 U/L (ref 5–34)
Alkaline Phosphatase: 69 U/L (ref 40–150)
BUN: 7 mg/dL (ref 7.0–26.0)
Creatinine: 0.8 mg/dL (ref 0.6–1.1)
Glucose: 75 mg/dl (ref 70–99)

## 2012-06-26 MED ORDER — ONDANSETRON HCL 8 MG PO TABS: 8.0000 mg | ORAL_TABLET | Freq: Two times a day (BID) | ORAL | Status: DC

## 2012-06-26 MED ORDER — LIDOCAINE-PRILOCAINE 2.5-2.5 % EX CREA: TOPICAL_CREAM | CUTANEOUS | Status: DC | PRN

## 2012-06-26 MED ORDER — LORAZEPAM 0.5 MG PO TABS: 0.5000 mg | ORAL_TABLET | Freq: Four times a day (QID) | ORAL | Status: DC | PRN

## 2012-06-26 MED ORDER — PROCHLORPERAZINE 25 MG RE SUPP: 25.0000 mg | Freq: Two times a day (BID) | RECTAL | Status: DC | PRN

## 2012-06-26 MED ORDER — PROCHLORPERAZINE MALEATE 10 MG PO TABS: 10.0000 mg | ORAL_TABLET | Freq: Four times a day (QID) | ORAL | Status: DC | PRN

## 2012-06-26 MED ORDER — DEXAMETHASONE 4 MG PO TABS: 8.0000 mg | ORAL_TABLET | Freq: Two times a day (BID) | ORAL | Status: DC

## 2012-06-26 NOTE — Progress Notes (Signed)
OFFICE PROGRESS NOTE  CC  Benita Stabile, MD [facility] 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  PRIOR THERAPY:  #1 patient was originally seen in the multidisciplinary breast clinic when she found a left breast mass she eventually had MRI and mammograms performed. She was diagnosed with invasive ductal carcinoma that was ER positive PR positive HER-2/neu positive measuring 3.1 cm by MRI criteria. Ki-67 was 70% HER-2 was amplified with a ratio 2.91.  #2 patient was seen at the multidisciplinary breast clinic and a recommendation for neoadjuvant chemotherapy was recommended as patient was interested in breast conservation. She will receive neoadjuvant Taxotere carboplatinum Herceptin. Taxotere carboplatinum will be given every 21 days with weekly Herceptin. Total of 6 cycles of Taxotere and carboplatinum will be administered.  CURRENT THERAPY: Patient will begin cycle 1 of TCH on 07/12/2012 with day 2 Neulasta  INTERVAL HISTORY: Melissa [name] 46 y.o. female returns for followup visit today. Overall she's doing well. She has had her chemotherapy teaching class she also has had her echocardiogram yesterday. She has also been seen by the cardia oncologist. Clinically she feels well I do think that she has more information now and feels much better about her diagnosis and wants to proceed with her treatment. She is going to have her Port-A-Cath placed on December 23 by Dr. Johna Sheriff. With eventual start of chemotherapy on 07/12/2012. Today she is without any complaints. Of note patient has cut down on smoking with eventual ideal being free of smoke starting on 07/12/2012. Remainder of the 10 point review of systems is negative  MEDICAL HISTORY: Past Medical History   Diagnosis Date  . Asthma   . Breast cancer   . Headache   . Back pain     ALLERGIES:  is allergic to aspirin and iodinated diagnostic agents.  MEDICATIONS:  Current Outpatient Prescriptions  Medication Sig Dispense Refill  . acetaminophen-codeine (TYLENOL #3) 300-30 MG per tablet Take 1 tablet by mouth every 4 (four) hours as needed.      Marland Kitchen albuterol (VENTOLIN HFA) 108 (90 BASE) MCG/ACT inhaler Inhale 2 puffs into the lungs every 6 (six) hours as needed.        Marland Kitchen amoxicillin (AMOXIL) 500 MG capsule Take 500 mg by mouth 3 (three) times daily.      Marland Kitchen etodolac (LODINE) 400 MG tablet Take 1 tablet by mouth Twice daily as needed.      . traMADol (ULTRAM) 50 MG tablet 1 to 2 every 4 hours as needed        SURGICAL HISTORY:  Past Surgical History  Procedure Date  . 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 Cesarean section 1995/1996  . Lobectomy 1997    upper left    REVIEW OF SYSTEMS:  Pertinent items are noted in HPI.   HEALTH MAINTENANCE:   PHYSICAL EXAMINATION: Blood pressure 101/66, pulse 71, temperature 98.6 F (37 C), temperature source Oral, resp. rate 20, height 5\' 2"  (1.575 m), weight 117 lb 6.4 oz (53.252 kg), last menstrual period 06/22/2012. Body mass index is 21.47 kg/(m^2). ECOG PERFORMANCE STATUS: 0 - Asymptomatic   General appearance: alert, cooperative and appears stated age Neck: no adenopathy, no carotid bruit, no JVD, supple, symmetrical, trachea midline and thyroid not enlarged, symmetric, no tenderness/mass/nodules Lymph nodes: Cervical,  supraclavicular, and axillary nodes normal. Resp: clear to auscultation bilaterally Back: symmetric, no curvature. ROM normal. No CVA tenderness. Cardio: regular rate and rhythm GI: soft, non-tender; bowel sounds normal; no masses,  no organomegaly Extremities: extremities normal, atraumatic, no cyanosis or edema Neurologic: Grossly normal Left breast examination reveals a palpable mass measuring  about 3 cm in the upper outer quadrant no nipple discharge no inversion or retraction . Right breast no masses or nipple discharge  LABORATORY DATA: Lab Results  Component Value Date   WBC 7.4 06/26/2012   HGB 13.0 06/26/2012   HCT 37.9 06/26/2012   MCV 96.1 06/26/2012   PLT 310 06/26/2012      Chemistry      Component Value Date/Time   NA 140 06/26/2012 0929   NA 141 01/26/2011 1039   K 4.5 06/26/2012 0929   K 4.5 01/26/2011 1039   CL 107 06/26/2012 0929   CL 106 01/26/2011 1039   CO2 26 06/26/2012 0929   CO2 28 01/26/2011 1039   BUN 7.0 06/26/2012 0929   BUN 7 01/26/2011 1039   CREATININE 0.8 06/26/2012 0929   CREATININE 0.6 01/26/2011 1039      Component Value Date/Time   CALCIUM 9.5 06/26/2012 0929   CALCIUM 9.2 01/26/2011 1039   ALKPHOS 69 06/26/2012 0929   AST 15 06/26/2012 0929   ALT 11 06/26/2012 0929   BILITOT 0.20 06/26/2012 0929       RADIOGRAPHIC STUDIES:  US Breast Left  06/05/2012  *RADIOLOGY REPORT*  Clinical Data:  The patient feels a mass in the upper portion of the left breast.   She had a benign excisional biopsy of the right breast in 2006.  She had a left upper lobectomy due to coccidioidomycosis in 1998.  DIGITAL DIAGNOSTIC BILATERAL MAMMOGRAM WITH CAD AND LEFT BREAST ULTRASOUND:  Comparison:  None.  Findings:  The breast tissue is heterogeneously dense.  There is an irregular mass with suspicious microcalcifications in the upper portion of the left breast.  No abnormality is noted on the right. Mammographic images were processed with CAD.  On physical exam, I palpate a 3 cm firm mass at 12 o'clock 4 cm from the left nipple.  Ultrasound is performed, showing an irregular hypoechoic mass with microcalcifications at 12 o'clock 4 cm from the left nipple measuring 2.5 x 2.6 x 1.5 cm.  Sonography of the left axilla demonstrates a prominent but not definitely abnormal axillary lymph node.  The appearance is highly suspicious for invasive mammary carcinoma. Biopsy  is recommended.  Ultrasound-guided core needle biopsy was discussed with the patient and she agreed with this plan.  IMPRESSION: Highly suspicious mass at 12 o'clock 4 cm from the left nipple measuring 2.5 x 2.6 x 1.5 cm.  RECOMMENDATION: Ultrasound-guided core needle biopsy is recommended.  This will be performed and reported separately.  I have discussed the findings and recommendations with the patient. Results were also provided in writing at the conclusion of the visit.  BI-RADS CATEGORY 5:  Highly suggestive of malignancy - appropriate action should be taken.   Original Report Authenticated By: Cain Saupe, M.D.    Mr Breast Bilateral W Wo Contrast  06/12/2012  Normal *RADIOLOGY REPORT*  Clinical Data: Recently diagnosed left breast invasive mammary carcinoma.  Preoperative evaluation.  BILATERAL BREAST MRI WITH AND WITHOUT CONTRAST  Technique: Multiplanar, multisequence MR images of both breasts were obtained prior to and following the intravenous administration of 9ml of Multihance.  Three dimensional images were evaluated at the  independent DynaCad workstation.  Comparison:  06/05/2012 mammogram and ultrasound.  Findings: There is marked diffuse parenchymal background enhancement.  There is an irregular enhancing mass located within the middle one third of the left breast at the 12 o'clock position with central clip artifact corresponding to the recently diagnosed left breast invasive mammary carcinoma.  The mass measures 3.1 x 2.4 x 1.7 cm in size and is associated with a mixture of plateau and washout enhancement kinetics.  There are no additional worrisome enhancing foci within either breast.  There is no evidence for axillary or internal mammary adenopathy and there are no additional findings.  IMPRESSION: 3.1 cm irregular enhancing mass located within the left breast at 12 o'clock position corresponding to the recently diagnosed left breast invasive mammary carcinoma.  No evidence for adenopathy  and no additional findings.  RECOMMENDATION:  Treatment plan  THREE-DIMENSIONAL MR IMAGE RENDERING ON INDEPENDENT WORKSTATION:  Three-dimensional MR images were rendered by post-processing of the original MR data on an independent workstation.  The three- dimensional MR images were interpreted, and findings were reported in the accompanying complete MRI report for this study.  BI-RADS CATEGORY 6:  Known biopsy-proven malignancy - appropriate action should be taken.   Original Report Authenticated By: Rolla Plate, M.D.    [name] Core Biopsy  06/05/2012  *RADIOLOGY REPORT*  Clinical Data:  Palpable mass at 12 o'clock 4 cm from the left nipple.  ULTRASOUND GUIDED VACUUM ASSISTED CORE BIOPSY OF THE LEFT BREAST  The patient and I discussed the procedure of ultrasound-guided biopsy, including benefits and alternatives.  We discussed the high likelihood of a successful procedure. We discussed the risks of the procedure including infection, bleeding, tissue injury, clip migration, and inadequate sampling.  Informed written consent was given.  Using sterile technique, 2% lidocaine, 2% lidocaine with epinephrine, ultrasound guidance, and a 12 gauge vacuum assisted needle, biopsy was performed of the mass at 12 o'clock 4 cm from the left nipple using a lateromedial approach.  At the conclusion of the procedure, a ribbon tissue marker clip was deployed into the biopsy cavity.  Follow-up 2-view mammogram was performed and dictated separately.  IMPRESSION: Ultrasound-guided biopsy of the mass at 12 o'clock 4 cm from the left nipple.  No apparent complications.   Original Report Authenticated By: Cain Saupe, M.D.    Mm Digital Diagnostic Bilat  06/05/2012  *RADIOLOGY REPORT*  Clinical Data:  The patient feels a mass in the upper portion of the left breast.   She had a benign excisional biopsy of the right breast in 2006.  She had a left upper lobectomy due to coccidioidomycosis in 1998.  DIGITAL DIAGNOSTIC BILATERAL  MAMMOGRAM WITH CAD AND LEFT BREAST ULTRASOUND:  Comparison:  None.  Findings:  The breast tissue is heterogeneously dense.  There is an irregular mass with suspicious microcalcifications in the upper portion of the left breast.  No abnormality is noted on the right. Mammographic images were processed with CAD.  On physical exam, I palpate a 3 cm firm mass at 12 o'clock 4 cm from the left nipple.  Ultrasound is performed, showing an irregular hypoechoic mass with microcalcifications at 12 o'clock 4 cm from the left nipple measuring 2.5 x 2.6 x 1.5 cm.  Sonography of the left axilla demonstrates a prominent but not definitely abnormal axillary lymph node.  The appearance is highly suspicious for invasive mammary carcinoma. Biopsy is recommended.  Ultrasound-guided core needle biopsy was discussed with the patient and she agreed with this plan.  IMPRESSION: Highly suspicious mass at 12 o'clock 4 cm from the left nipple measuring 2.5 x 2.6 x 1.5 cm.  RECOMMENDATION: Ultrasound-guided core needle biopsy is recommended.  This will be performed and reported separately.  I have discussed the findings and recommendations with the patient. Results were also provided in writing at the conclusion of the visit.  BI-RADS CATEGORY 5:  Highly suggestive of malignancy - appropriate action should be taken.   Original Report Authenticated By: Cain Saupe, M.D.    Mm Digital Diagnostic Unilat L  06/05/2012  *RADIOLOGY REPORT*  Clinical Data:  Ultrasound-guided core needle biopsy of a mass at 12 o'clock 4 cm from the left nipple with clip placement.  DIGITAL DIAGNOSTIC LEFT MAMMOGRAM  Comparison:  None.  Findings:  Films are performed following ultrasound guided biopsy of a mass at 12 o'clock 4 cm from the left nipple.  The ribbon clip is appropriately positioned.  IMPRESSION: Appropriate clip placement following ultrasound-guided core needle biopsy of a mass at 12 o'clock 4 cm from the left nipple.   Original Report  Authenticated By: Cain Saupe, M.D.    Mm Radiologist Eval And Mgmt  06/06/2012  *RADIOLOGY REPORT*  ESTABLISHED PATIENT OFFICE VISIT - LEVEL II [phone])  Chief Complaint:  The patient presented with a palpable left breast mass.  History:  Ultrasound guided core biopsy of the left breast mass was performed on 06/05/2012.  Exam:  The wound site is clean and dry with no signs of inflammation or hematoma.  Results of the biopsy were discussed with the patient.  Pathology: Invasive ductal carcinoma was reported histologically. This corresponds well with the imaging findings.  Assessment and Plan:  The patient will be seen at the Multidisciplinary Clinic on 06/13/2012.  Breast MRI has been scheduled.  Informational reading material was provided to the patient.   Original Report Authenticated By: Baird Lyons, M.D.     ASSESSMENT: 46 year old female with  #1 stage II a (3.1 cm) invasive ductal carcinoma of the left breast status post needle core biopsy that showed ER positive PR positive HER-2/neu positive breast cancer with a Ki-67 at 70%. Patient is now going to receive neoadjuvant chemotherapy and she desires breast conservation. She was originally seen in the multidisciplinary breast clinic.  #2 she will begin her chemotherapy on 07/13/2011 after she has her Port-A-Cath placed next week she has had her chemotherapy teaching class as well as echocardiogram. She has also been seen by the cardiology.   PLAN:   #1 patient will begin [facility] on 07/12/12.all of her orders have been placed and signed.  #2 echocardiogram chemotherapy teaching has also been done.  #3 anti-emetics were sent to her pharmacy and some of the prescription were given to her.  #4 all questions were answered today    All questions were answered. The patient knows to call the clinic with any problems, questions or concerns. We can certainly see the patient much sooner if necessary.  I spent 40 minutes counseling the patient face  to face. The total time spent in the appointment was 30 minutes.    Drue Second, MD Medical/Oncology [facility] [phone] (beeper) [phone] (Office)  06/26/2012, 10:31 AM

## 2012-06-26 NOTE — Telephone Encounter (Signed)
Per staff message and POF I have scheduled appts two past office visit. JMW

## 2012-06-26 NOTE — Patient Instructions (Addendum)
Proceed with chemotherapy on 07/12/12

## 2012-06-26 NOTE — Progress Notes (Signed)
Enrolled pt in the Neulasta First Step program.  I will fax signed form and activate card today. °

## 2012-06-26 NOTE — Telephone Encounter (Signed)
Gave patient appointment for weekly appointment with lindsey  Sent michelle email to set up treatment

## 2012-06-27 ENCOUNTER — Ambulatory Visit: Payer: BC Managed Care – PPO | Admitting: Oncology

## 2012-06-27 ENCOUNTER — Other Ambulatory Visit: Payer: BC Managed Care – PPO | Admitting: Lab

## 2012-06-28 ENCOUNTER — Encounter (HOSPITAL_BASED_OUTPATIENT_CLINIC_OR_DEPARTMENT_OTHER): Payer: Self-pay | Admitting: *Deleted

## 2012-06-28 NOTE — Progress Notes (Signed)
No labs needed-labs done cancer center 06/26/12-echo pre chemo 12/13 resp status stable

## 2012-07-02 ENCOUNTER — Encounter (HOSPITAL_BASED_OUTPATIENT_CLINIC_OR_DEPARTMENT_OTHER): Payer: Self-pay

## 2012-07-02 ENCOUNTER — Encounter (HOSPITAL_BASED_OUTPATIENT_CLINIC_OR_DEPARTMENT_OTHER)
Admission: RE | Disposition: A | Discharge status: Home / Self Care | Payer: Self-pay | Source: Ambulatory Visit | Attending: General Surgery

## 2012-07-02 ENCOUNTER — Ambulatory Visit (HOSPITAL_BASED_OUTPATIENT_CLINIC_OR_DEPARTMENT_OTHER)
Admission: RE | Admit: 2012-07-02 | Discharge: 2012-07-02 | Disposition: A | Discharge status: Home / Self Care | Payer: BC Managed Care – PPO | Source: Ambulatory Visit | Attending: General Surgery | Admitting: General Surgery

## 2012-07-02 ENCOUNTER — Ambulatory Visit (HOSPITAL_COMMUNITY): Payer: BC Managed Care – PPO

## 2012-07-02 ENCOUNTER — Ambulatory Visit (HOSPITAL_BASED_OUTPATIENT_CLINIC_OR_DEPARTMENT_OTHER): Payer: BC Managed Care – PPO | Admitting: Certified Registered"

## 2012-07-02 ENCOUNTER — Encounter (HOSPITAL_BASED_OUTPATIENT_CLINIC_OR_DEPARTMENT_OTHER): Payer: Self-pay | Admitting: Certified Registered"

## 2012-07-02 ENCOUNTER — Ambulatory Visit: Admit: 2012-07-02 | Payer: Self-pay | Admitting: General Surgery

## 2012-07-02 ENCOUNTER — Encounter: Payer: Self-pay | Admitting: *Deleted

## 2012-07-02 DIAGNOSIS — C50919 Malignant neoplasm of unspecified site of unspecified female breast: Secondary | ICD-10-CM

## 2012-07-02 DIAGNOSIS — J45909 Unspecified asthma, uncomplicated: Secondary | ICD-10-CM | POA: Insufficient documentation

## 2012-07-02 DIAGNOSIS — F17200 Nicotine dependence, unspecified, uncomplicated: Secondary | ICD-10-CM | POA: Insufficient documentation

## 2012-07-02 DIAGNOSIS — C50219 Malignant neoplasm of upper-inner quadrant of unspecified female breast: Secondary | ICD-10-CM

## 2012-07-02 HISTORY — PX: PORTACATH PLACEMENT: SHX2246

## 2012-07-02 SURGERY — INSERTION, TUNNELED CENTRAL VENOUS DEVICE, WITH PORT
Anesthesia: Monitor Anesthesia Care

## 2012-07-02 SURGERY — INSERTION, TUNNELED CENTRAL VENOUS DEVICE, WITH PORT
Anesthesia: Monitor Anesthesia Care | Site: Chest | Wound class: Clean

## 2012-07-02 MED ORDER — PROPOFOL 10 MG/ML IV EMUL
INTRAVENOUS | Status: DC | PRN
  Administered 2012-07-02: 75 ug/kg/min via INTRAVENOUS

## 2012-07-02 MED ORDER — CHLORHEXIDINE GLUCONATE 4 % EX LIQD: 1.0000 "application " | Freq: Once | CUTANEOUS | Status: DC

## 2012-07-02 MED ORDER — FENTANYL CITRATE 0.05 MG/ML IJ SOLN
INTRAMUSCULAR | Status: DC | PRN
  Administered 2012-07-02 (×2): 50 ug via INTRAVENOUS

## 2012-07-02 MED ORDER — SODIUM BICARBONATE 4 % IV SOLN
INTRAVENOUS | Status: DC | PRN
  Administered 2012-07-02: 5 mL via INTRAVENOUS

## 2012-07-02 MED ORDER — BUPIVACAINE-EPINEPHRINE 0.25% -1:200000 IJ SOLN
INTRAMUSCULAR | Status: DC | PRN
  Administered 2012-07-02: 50 mL

## 2012-07-02 MED ORDER — MIDAZOLAM HCL 5 MG/5ML IJ SOLN
INTRAMUSCULAR | Status: DC | PRN
  Administered 2012-07-02 (×2): 1 mg via INTRAVENOUS

## 2012-07-02 MED ORDER — CEFAZOLIN SODIUM-DEXTROSE 2-3 GM-% IV SOLR
2.0000 g | INTRAVENOUS | Status: AC
  Administered 2012-07-02: 2 g via INTRAVENOUS

## 2012-07-02 MED ORDER — ONDANSETRON HCL 4 MG/2ML IJ SOLN
INTRAMUSCULAR | Status: DC | PRN
  Administered 2012-07-02: 4 mg via INTRAVENOUS

## 2012-07-02 MED ORDER — LACTATED RINGERS IV SOLN
INTRAVENOUS | Status: DC
  Administered 2012-07-02 (×2): via INTRAVENOUS

## 2012-07-02 MED ORDER — LIDOCAINE HCL (PF) 1 % IJ SOLN
INTRAMUSCULAR | Status: DC | PRN
  Administered 2012-07-02: 30 mL

## 2012-07-02 MED ORDER — FENTANYL CITRATE 0.05 MG/ML IJ SOLN: 25.0000 ug | INTRAMUSCULAR | Status: DC | PRN

## 2012-07-02 MED ORDER — HEPARIN SOD (PORK) LOCK FLUSH 100 UNIT/ML IV SOLN
INTRAVENOUS | Status: DC | PRN
  Administered 2012-07-02: 500 [IU] via INTRAVENOUS

## 2012-07-02 MED ORDER — PROMETHAZINE HCL 25 MG/ML IJ SOLN
INTRAMUSCULAR | Status: DC | PRN
  Administered 2012-07-02: 6.25 mg via INTRAVENOUS

## 2012-07-02 MED ORDER — LIDOCAINE HCL (CARDIAC) 20 MG/ML IV SOLN
INTRAVENOUS | Status: DC | PRN
  Administered 2012-07-02: 20 mg via INTRAVENOUS

## 2012-07-02 MED ORDER — HEPARIN (PORCINE) IN NACL 2-0.9 UNIT/ML-% IJ SOLN
INTRAMUSCULAR | Status: DC | PRN
  Administered 2012-07-02: 1 via INTRAVENOUS

## 2012-07-02 SURGICAL SUPPLY — 45 items
ADH SKN CLS APL DERMABOND .7 (GAUZE/BANDAGES/DRESSINGS) ×1
BAG DECANTER FOR FLEXI CONT (MISCELLANEOUS) ×2 IMPLANT
BANDAGE ADHESIVE 1X3 (GAUZE/BANDAGES/DRESSINGS) ×2 IMPLANT
BLADE SURG 15 STRL LF DISP TIS (BLADE) ×1 IMPLANT
BLADE SURG 15 STRL SS (BLADE) ×1
CHLORAPREP W/TINT 26ML (MISCELLANEOUS) ×2 IMPLANT
CLOTH BEACON ORANGE TIMEOUT ST (SAFETY) ×2 IMPLANT
COVER MAYO STAND STRL (DRAPES) ×2 IMPLANT
COVER TABLE BACK 60X90 (DRAPES) ×2 IMPLANT
DECANTER SPIKE VIAL GLASS SM (MISCELLANEOUS) IMPLANT
DERMABOND ADVANCED (GAUZE/BANDAGES/DRESSINGS) ×1
DERMABOND ADVANCED .7 DNX12 (GAUZE/BANDAGES/DRESSINGS) ×1 IMPLANT
DRAPE C-ARM 42X72 X-RAY (DRAPES) ×2 IMPLANT
DRAPE LAPAROTOMY T 102X78X121 (DRAPES) ×2 IMPLANT
DRAPE UTILITY XL STRL (DRAPES) ×2 IMPLANT
ELECT REM PT RETURN 9FT ADLT (ELECTROSURGICAL) ×2
ELECTRODE REM PT RTRN 9FT ADLT (ELECTROSURGICAL) ×1 IMPLANT
GLOVE BIO SURGEON STRL SZ 6.5 (GLOVE) ×2 IMPLANT
GLOVE BIOGEL PI IND STRL 7.0 (GLOVE) ×1 IMPLANT
GLOVE BIOGEL PI IND STRL 8 (GLOVE) ×1 IMPLANT
GLOVE BIOGEL PI INDICATOR 7.0 (GLOVE) ×1
GLOVE BIOGEL PI INDICATOR 8 (GLOVE) ×1
GLOVE SS BIOGEL STRL SZ 7.5 (GLOVE) ×1 IMPLANT
GLOVE SUPERSENSE BIOGEL SZ 7.5 (GLOVE) ×1
GOWN PREVENTION PLUS XLARGE (GOWN DISPOSABLE) ×4 IMPLANT
IV CATH PLACEMENT UNIT 16 GA (IV SOLUTION) IMPLANT
IV HEPARIN 1000UNITS/500ML (IV SOLUTION) ×2 IMPLANT
IV KIT MINILOC 20X1 SAFETY (NEEDLE) IMPLANT
KIT BARDPORT ISP (Port) IMPLANT
KIT PORT POWER 8FR ISP CVUE (Catheter) ×2 IMPLANT
NEEDLE HYPO 22GX1.5 SAFETY (NEEDLE) ×2 IMPLANT
NEEDLE HYPO 25X1 1.5 SAFETY (NEEDLE) ×2 IMPLANT
PACK BASIN DAY SURGERY FS (CUSTOM PROCEDURE TRAY) ×2 IMPLANT
PENCIL BUTTON HOLSTER BLD 10FT (ELECTRODE) ×2 IMPLANT
SET SHEATH INTRODUCER 10FR (MISCELLANEOUS) IMPLANT
SHEATH COOK PEEL AWAY SET 9F (SHEATH) IMPLANT
SLEEVE SCD COMPRESS KNEE MED (MISCELLANEOUS) ×2 IMPLANT
SUT MON AB 4-0 PC3 18 (SUTURE) ×2 IMPLANT
SUT PROLENE 2 0 CT2 30 (SUTURE) ×2 IMPLANT
SUT SILK 4 0 TIES 17X18 (SUTURE) IMPLANT
SYR 5ML LUER SLIP (SYRINGE) ×2 IMPLANT
SYR CONTROL 10ML LL (SYRINGE) ×2 IMPLANT
TOWEL OR 17X24 6PK STRL BLUE (TOWEL DISPOSABLE) ×4 IMPLANT
TOWEL OR NON WOVEN STRL DISP B (DISPOSABLE) ×2 IMPLANT
WATER STERILE IRR 1000ML POUR (IV SOLUTION) IMPLANT

## 2012-07-02 NOTE — Interval H&P Note (Signed)
History and Physical Interval Note:  07/02/2012 1:22 PM  Melissa [name]  has presented today for surgery, with the diagnosis of breast cancer  The various methods of treatment have been discussed with the patient and family. After consideration of risks, benefits and other options for treatment, the patient has consented to  Procedure(s) (LRB) with comments: INSERTION PORT-A-CATH (N/A) as a surgical intervention .  The patient's history has been reviewed, patient examined, no change in status, stable for surgery.  I have reviewed the patient's chart and labs.  Questions were answered to the patient's satisfaction.     Minaal Struckman T

## 2012-07-02 NOTE — Progress Notes (Signed)
Mailed after appt letter to pt. 

## 2012-07-02 NOTE — Anesthesia Preprocedure Evaluation (Signed)
Anesthesia Evaluation  Patient identified by MRN, date of birth, ID band Patient awake    Reviewed: Allergy & Precautions, H&P , NPO status , Patient's Chart, lab work & pertinent test results  Airway Mallampati: II TM Distance: >3 FB Neck ROM: Full    Dental No notable dental hx. (+) Edentulous Upper and Dental Advisory Given   Pulmonary asthma ,  breath sounds clear to auscultation  Pulmonary exam normal       Cardiovascular negative cardio ROS  Rhythm:Regular Rate:Normal     Neuro/Psych  Headaches, negative psych ROS   GI/Hepatic negative GI ROS, Neg liver ROS,   Endo/Other  negative endocrine ROS  Renal/GU negative Renal ROS  negative genitourinary   Musculoskeletal   Abdominal   Peds  Hematology negative hematology ROS (+)   Anesthesia Other Findings   Reproductive/Obstetrics negative OB ROS                           Anesthesia Physical Anesthesia Plan  ASA: II  Anesthesia Plan: MAC   Post-op Pain Management:    Induction: Intravenous  Airway Management Planned: Simple Face Mask  Additional Equipment:   Intra-op Plan:   Post-operative Plan:   Informed Consent: I have reviewed the patients History and Physical, chart, labs and discussed the procedure including the risks, benefits and alternatives for the proposed anesthesia with the patient or authorized representative who has indicated his/her understanding and acceptance.   Dental advisory given  Plan Discussed with: CRNA  Anesthesia Plan Comments:         Anesthesia Quick Evaluation

## 2012-07-02 NOTE — Anesthesia Procedure Notes (Signed)
Procedure Name: MAC Date/Time: 07/02/2012 1:31 PM Performed by: Verlan Friends Pre-anesthesia Checklist: Patient identified, Timeout performed, Emergency Drugs available, Patient being monitored and Suction available Patient Re-evaluated:Patient Re-evaluated prior to inductionOxygen Delivery Method: Simple face mask Placement Confirmation: positive ETCO2

## 2012-07-02 NOTE — Transfer of Care (Signed)
Immediate Anesthesia Transfer of Care Note  Patient: Melissa [name]  Procedure(s) Performed: Procedure(s) (LRB) with comments: INSERTION PORT-A-CATH (N/A) - right  Patient Location: PACU  Anesthesia Type:MAC  Level of Consciousness: awake, alert , oriented and patient cooperative  Airway & Oxygen Therapy: Patient Spontanous Breathing and Patient connected to face mask oxygen  Post-op Assessment: Report given to PACU RN and Post -op Vital signs reviewed and stable  Post vital signs: Reviewed and stable  Complications: No apparent anesthesia complications

## 2012-07-02 NOTE — Anesthesia Postprocedure Evaluation (Signed)
  Anesthesia Post-op Note  Patient: Melissa [name]  Procedure(s) Performed: Procedure(s) (LRB) with comments: INSERTION PORT-A-CATH (N/A) - right  Patient Location: PACU  Anesthesia Type:MAC  Level of Consciousness: awake, alert  and oriented  Airway and Oxygen Therapy: Patient Spontanous Breathing  Post-op Pain: none  Post-op Assessment: Post-op Vital signs reviewed, Patient's Cardiovascular Status Stable, Respiratory Function Stable, Patent Airway and No signs of Nausea or vomiting  Post-op Vital Signs: Reviewed and stable  Complications: No apparent anesthesia complications

## 2012-07-02 NOTE — H&P (View-Only) (Signed)
Subjective:   new diagnosis left breast cancer  Patient ID: Melissa [name], female   DOB: 04/16/1966, 46 y.o.   MRN: [id number]  HPI Patient is a pleasant 46 year old female referred by Dr. Jean Rosenthal at the breast center for a new diagnosis of left breast cancer. The patient has a history of a benign right breast biopsy 2006. This was the last time she had a mammogram. She recently was able to feel a lump in the upper inner left breast. She subsequently presented to the breast center for evaluation. I have personally reviewed her imaging. Mammogram shows bilaterally dense breast tissue but there is an irregular mass in the upper inner left breast. Ultrasound revealed an irregular hypoechoic mass measuring 2.6 cm. There were no definitely abnormal nodes in the axilla. Subsequent large core biopsy was performed. 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 Subsequent rest MRI was performed which I've reviewed. This reveals a 3.1 cm mass corresponding to the known malignancy with no additional worrisome lesions. She has not had any unusual pain. No skin changes or nipple discharge.  Past Medical History  Diagnosis Date  . Asthma   . Breast cancer   . Headache   . Back pain    Past Surgical History  Procedure Date  . 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 Cesarean section 1995/1996  . Lobectomy 1997    upper left   Current Outpatient Prescriptions  Medication Sig Dispense Refill  . acetaminophen-codeine (TYLENOL #3) 300-30 MG per tablet Take 1 tablet by mouth every 4 (four) hours as needed.      Marland Kitchen albuterol (VENTOLIN HFA) 108 (90 BASE) MCG/ACT inhaler Inhale 2 puffs into the lungs every 6 (six) hours as needed.        Marland Kitchen amoxicillin (AMOXIL) 500 MG capsule Take 500 mg by mouth 3 (three) times daily.      Marland Kitchen etodolac (LODINE) 400 MG tablet Take 1 tablet by mouth Twice daily as needed.      . traMADol (ULTRAM) 50 MG tablet 1 to 2 every  4 hours as needed       No current facility-administered medications for this visit.   Facility-Administered Medications Ordered in Other Visits  Medication Dose Route Frequency Provider Last Rate Last Dose  . [COMPLETED] gadobenate dimeglumine (MULTIHANCE) injection 9 mL  9 mL Intravenous Once PRN Medication Radiologist, MD   9 mL at 06/12/12 1222   Allergies  Allergen Reactions  . Aspirin     Unknown reaction  . Iodinated Diagnostic Agents Hives    Pt states in [place] got hives and itching with IV contrast. Per pt allergy was to prev CT.   History  Substance Use Topics  . Smoking status: Current Every Day Smoker -- 1.0 packs/day for 24 years    Types: Cigarettes  . Smokeless tobacco: Never Used  . Alcohol Use: No     Review of Systems  Constitutional: Negative.   HENT: Negative.   Respiratory: Positive for shortness of breath and wheezing.   Cardiovascular: Negative.   Gastrointestinal: Negative.   Neurological: Negative.   Psychiatric/Behavioral: Negative.        Objective:   Physical Exam General: Alert, Thin well-appearing Caucasian female, in no distress Skin: Warm and dry without rash or infection. HEENT: No palpable masses or thyromegaly. Sclera nonicteric. Pupils equal round and reactive. Oropharynx clear. Lymph nodes: No cervical, supraclavicular, or inguinal nodes palpable.  Breasts: Fairly small breasts bilaterally. There is an easily palpable firm freely movable mass in the upper inner quadrant of the left breast. No other masses palpable in either breast. No skin changes or nipple inversion or discharge. Lungs: Breath sounds clear and equal without increased work of breathing Cardiovascular: Regular rate and rhythm without murmur. No JVD or edema. Peripheral pulses intact. Abdomen: Nondistended. Soft and nontender. No masses palpable. No organomegaly. No palpable hernias. Extremities: No edema or joint swelling or deformity. No chronic venous  stasis changes. Neurologic: Alert and fully oriented.    Assessment:     New diagnosis of a 3 cm high-grade triple positive cancer of the left breast. Her tumor is large relative to breast size. She will require chemotherapy and I believe that neoadjuvant treatment could significantly improve her chances of successful breast conservation which is her goal. After discussion with Dr. Park Breed she is in agreement and we will plan neoadjuvant chemotherapy with a goal of breast conservation. We discussed Port-A-Cath placement including its nature and indications and risks of anesthetic complications, bleeding, infection, pneumothorax, catheter displacement or malfunction and thrombosis. We will schedule this for her in the near future.    Plan:     Port-A-Cath placement as an outpatient with neoadjuvant chemotherapy.

## 2012-07-02 NOTE — Op Note (Signed)
Preoperative diagnosis: Cancer of the breast and the poor venous access  Postoperative diagnosis: Same  Procedure: Placement of Powerport subcutaneous venous port  Surgeon: Glenna Fellows M.D.  Anesthesia: Local with IV sedation  Description of procedure: Patient is brought to the operating room and placed in the supine position on the operating table. IV sedation was administered. The entire upper chest and neck were widely sterilely prepped and draped. Local anesthesia was used to infiltrate the insertion of port site. The right subclavian vein was cannulated with a needle and guidewire without difficulty and position in the superior vena cava was confirmed by fluoroscopy. The introducer was then placed over the guidewire and the flush catheter placed via the introducer which was stripped away and the tip of the catheter positioned near the cavoatrial junction. A small transverse incision was made in the anterior chest wall and subcutaneous pocket created. The catheter was tunneled into the pocket, trimmed to length, and attached to the flushed port which was positioned in the pocket. The port was sutured to the chest wall with interrupted 2-0 Prolene. The incisions were closed with subcutaneous interrupted Monocryl and the skin incisions closed with subcuticular Monocryl and Dermabond. The port was accessed and flushed and aspirated easily and was left flushed with concentrated heparin solution. Sponge needle as the counts were correct. The patient was taken to recovery in good condition.  Melissa [name]  07/02/2012

## 2012-07-06 ENCOUNTER — Telehealth (INDEPENDENT_AMBULATORY_CARE_PROVIDER_SITE_OTHER): Payer: Self-pay | Admitting: General Surgery

## 2012-07-06 ENCOUNTER — Encounter (HOSPITAL_BASED_OUTPATIENT_CLINIC_OR_DEPARTMENT_OTHER): Payer: Self-pay | Admitting: General Surgery

## 2012-07-06 NOTE — Telephone Encounter (Signed)
Patient called wanting to know if it was normal to feel her PAC that she had inserted on Monday 12/23 whenever she lifted her right arm because of her small size.Melissa Kitchentold patient that this was very normal and if she had any other questions or concerns to call our office at anytime...patient stated that PAC was healing nicely and would call us if she needed [name]

## 2012-07-11 DIAGNOSIS — Z853 Personal history of malignant neoplasm of breast: Secondary | ICD-10-CM

## 2012-07-11 HISTORY — PX: BREAST LUMPECTOMY: SHX2

## 2012-07-11 HISTORY — DX: Personal history of malignant neoplasm of breast: Z85.3

## 2012-07-12 ENCOUNTER — Ambulatory Visit (HOSPITAL_BASED_OUTPATIENT_CLINIC_OR_DEPARTMENT_OTHER): Payer: BC Managed Care – PPO | Admitting: Oncology

## 2012-07-12 ENCOUNTER — Ambulatory Visit (HOSPITAL_BASED_OUTPATIENT_CLINIC_OR_DEPARTMENT_OTHER): Payer: BC Managed Care – PPO

## 2012-07-12 ENCOUNTER — Telehealth: Payer: Self-pay | Admitting: *Deleted

## 2012-07-12 ENCOUNTER — Encounter: Payer: Self-pay | Admitting: Oncology

## 2012-07-12 ENCOUNTER — Other Ambulatory Visit (HOSPITAL_BASED_OUTPATIENT_CLINIC_OR_DEPARTMENT_OTHER): Payer: BC Managed Care – PPO

## 2012-07-12 VITALS — BP 96/64 | HR 79 | Temp 100.2°F | Resp 20

## 2012-07-12 VITALS — BP 113/73 | HR 73 | Temp 97.9°F | Resp 20 | Ht 62.0 in | Wt 117.6 lb

## 2012-07-12 DIAGNOSIS — C50219 Malignant neoplasm of upper-inner quadrant of unspecified female breast: Secondary | ICD-10-CM

## 2012-07-12 DIAGNOSIS — C50919 Malignant neoplasm of unspecified site of unspecified female breast: Secondary | ICD-10-CM

## 2012-07-12 DIAGNOSIS — Z170 Estrogen receptor positive status [ER+]: Secondary | ICD-10-CM

## 2012-07-12 DIAGNOSIS — Z5112 Encounter for antineoplastic immunotherapy: Secondary | ICD-10-CM

## 2012-07-12 DIAGNOSIS — Z5111 Encounter for antineoplastic chemotherapy: Secondary | ICD-10-CM

## 2012-07-12 LAB — CBC WITH DIFFERENTIAL/PLATELET
Basophils Absolute: 0.1 10*3/uL (ref 0.0–0.1)
EOS%: 5.9 % (ref 0.0–7.0)
HCT: 39.3 % (ref 34.8–46.6)
HGB: 13.5 g/dL (ref 11.6–15.9)
LYMPH%: 27.9 % (ref 14.0–49.7)
MCH: 31.9 pg (ref 25.1–34.0)
MCV: 92.9 fL (ref 79.5–101.0)
NEUT%: 58.2 % (ref 38.4–76.8)
Platelets: 280 10*3/uL (ref 145–400)
lymph#: 2.6 10*3/uL (ref 0.9–3.3)

## 2012-07-12 LAB — COMPREHENSIVE METABOLIC PANEL (CC13)
Albumin: 3.8 g/dL (ref 3.5–5.0)
Alkaline Phosphatase: 65 U/L (ref 40–150)
BUN: 9 mg/dL (ref 7.0–26.0)
Calcium: 9.6 mg/dL (ref 8.4–10.4)
Creatinine: 0.7 mg/dL (ref 0.6–1.1)
Glucose: 83 mg/dl (ref 70–99)
Potassium: 4.2 mEq/L (ref 3.5–5.1)

## 2012-07-12 MED ORDER — SODIUM CHLORIDE 0.9 % IV SOLN
603.0000 mg | Freq: Once | INTRAVENOUS | Status: AC
  Administered 2012-07-12: 600 mg via INTRAVENOUS
  Filled 2012-07-12: qty 60

## 2012-07-12 MED ORDER — SODIUM CHLORIDE 0.9 % IJ SOLN
10.0000 mL | INTRAMUSCULAR | Status: DC | PRN
  Filled 2012-07-12: qty 10

## 2012-07-12 MED ORDER — DEXAMETHASONE SODIUM PHOSPHATE 10 MG/ML IJ SOLN
20.0000 mg | Freq: Once | INTRAMUSCULAR | Status: AC
  Administered 2012-07-12: 20 mg via INTRAVENOUS

## 2012-07-12 MED ORDER — DOCETAXEL CHEMO INJECTION 160 MG/16ML
75.0000 mg/m2 | Freq: Once | INTRAVENOUS | Status: AC
  Administered 2012-07-12: 110 mg via INTRAVENOUS
  Filled 2012-07-12: qty 11

## 2012-07-12 MED ORDER — SODIUM CHLORIDE 0.9 % IV SOLN
Freq: Once | INTRAVENOUS | Status: AC
  Administered 2012-07-12: 11:00:00 via INTRAVENOUS

## 2012-07-12 MED ORDER — TRASTUZUMAB CHEMO INJECTION 440 MG
4.0000 mg/kg | Freq: Once | INTRAVENOUS | Status: AC
  Administered 2012-07-12: 210 mg via INTRAVENOUS
  Filled 2012-07-12: qty 10

## 2012-07-12 MED ORDER — DEXAMETHASONE 4 MG PO TABS: 8.0000 mg | ORAL_TABLET | Freq: Two times a day (BID) | ORAL | Status: DC

## 2012-07-12 MED ORDER — SODIUM CHLORIDE 0.9 % IJ SOLN
10.0000 mL | INTRAMUSCULAR | Status: DC | PRN
  Administered 2012-07-12: 10 mL
  Filled 2012-07-12: qty 10

## 2012-07-12 MED ORDER — ACETAMINOPHEN 325 MG PO TABS
650.0000 mg | ORAL_TABLET | Freq: Once | ORAL | Status: AC
  Administered 2012-07-12: 650 mg via ORAL

## 2012-07-12 MED ORDER — SODIUM CHLORIDE 0.9 % IV SOLN: Freq: Once | INTRAVENOUS | Status: DC

## 2012-07-12 MED ORDER — VALACYCLOVIR HCL 500 MG PO TABS: ORAL_TABLET | ORAL | Status: DC

## 2012-07-12 MED ORDER — ONDANSETRON 16 MG/50ML IVPB (CHCC)
16.0000 mg | Freq: Once | INTRAVENOUS | Status: AC
  Administered 2012-07-12: 16 mg via INTRAVENOUS

## 2012-07-12 MED ORDER — HEPARIN SOD (PORK) LOCK FLUSH 100 UNIT/ML IV SOLN
500.0000 [IU] | Freq: Once | INTRAVENOUS | Status: AC | PRN
  Administered 2012-07-12: 500 [IU]
  Filled 2012-07-12: qty 5

## 2012-07-12 MED ORDER — DIPHENHYDRAMINE HCL 25 MG PO CAPS
50.0000 mg | ORAL_CAPSULE | Freq: Once | ORAL | Status: AC
  Administered 2012-07-12: 50 mg via ORAL

## 2012-07-12 NOTE — Patient Instructions (Addendum)
Proceed with chemotherapy today  Return on 07/13/12 for neulasta injection, prior to the injection take zyrtec and then continue for another 3 days to help prevent aches and pains  We will see you back in 1 week for herceptin only

## 2012-07-12 NOTE — Patient Instructions (Signed)
[facility] Discharge Instructions for Patients Receiving Chemotherapy  Today you received the following chemotherapy agents: taxotere, carboplatin, herceptin.    To help prevent nausea and vomiting after your treatment, we encourage you to take your nausea medication.   Take it as often as prescribed.     If you develop nausea and vomiting that is not controlled by your nausea medication, call the clinic. If it is after clinic hours your family physician or the after hours number for the clinic or go to the Emergency Department.   BELOW ARE SYMPTOMS THAT SHOULD BE REPORTED IMMEDIATELY:  *FEVER GREATER THAN 100.5 F  *CHILLS WITH OR WITHOUT FEVER  NAUSEA AND VOMITING THAT IS NOT CONTROLLED WITH YOUR NAUSEA MEDICATION  *UNUSUAL SHORTNESS OF BREATH  *UNUSUAL BRUISING OR BLEEDING  TENDERNESS IN MOUTH AND THROAT WITH OR WITHOUT PRESENCE OF ULCERS  *URINARY PROBLEMS  *BOWEL PROBLEMS  UNUSUAL RASH Items with * indicate a potential emergency and should be followed up as soon as possible.  One of the nurses will contact you 24 hours after your treatment. Please let the nurse know about any problems that you may have experienced. Feel free to call the clinic you have any questions or concerns. The clinic phone number is [phone].   I have been informed and understand all the instructions given to me. I know to contact the clinic, my physician, or go to the Emergency Department if any problems should occur. I do not have any questions at this time, but understand that I may call the clinic during office hours   should I have any questions or need assistance in obtaining follow up care.    __________________________________________  _____________  __________ Signature of Patient or Authorized Representative            Date                   Time    __________________________________________ Nurse's Signature   Docetaxel injection (Taxotere) What is this  medicine? DOCETAXEL (doe se TAX el) is a chemotherapy drug. It targets fast dividing cells, like cancer cells, and causes these cells to die. This medicine is used to treat many types of cancers like breast cancer, certain stomach cancers, head and neck cancer, lung cancer, and prostate cancer. This medicine may be used for other purposes; ask your health care provider or pharmacist if you have questions. What should I tell my health care provider before I take this medicine? They need to know if you have any of these conditions: -infection (especially a virus infection such as chickenpox, cold sores, or herpes) -liver disease -low blood counts, like low white cell, platelet, or red cell counts -an unusual or allergic reaction to docetaxel, polysorbate 80, other chemotherapy agents, other medicines, foods, dyes, or preservatives -pregnant or trying to get pregnant -breast-feeding How should I use this medicine? This drug is given as an infusion into a vein. It is administered in a hospital or clinic by a specially trained health care professional. Talk to your pediatrician regarding the use of this medicine in children. Special care may be needed. Overdosage: If you think you have taken too much of this medicine contact a poison control center or emergency room at once. NOTE: This medicine is only for you. Do not share this medicine with others. What if I miss a dose? It is important not to miss your dose. Call your doctor or health care professional if you are unable to  keep an appointment. What may interact with this medicine? -cyclosporine -erythromycin -ketoconazole -medicines to increase blood counts like filgrastim, pegfilgrastim, sargramostim -vaccines Talk to your doctor or health care professional before taking any of these medicines: -acetaminophen -aspirin -ibuprofen -ketoprofen -naproxen This list may not describe all possible interactions. Give your health care provider a  list of all the medicines, herbs, non-prescription drugs, or dietary supplements you use. Also tell them if you smoke, drink alcohol, or use illegal drugs. Some items may interact with your medicine. What should I watch for while using this medicine? Your condition will be monitored carefully while you are receiving this medicine. You will need important blood work done while you are taking this medicine. This drug may make you feel generally unwell. This is not uncommon, as chemotherapy can affect healthy cells as well as cancer cells. Report any side effects. Continue your course of treatment even though you feel ill unless your doctor tells you to stop. In some cases, you may be given additional medicines to help with side effects. Follow all directions for their use. Call your doctor or health care professional for advice if you get a fever, chills or sore throat, or other symptoms of a cold or flu. Do not treat yourself. This drug decreases your body's ability to fight infections. Try to avoid being around people who are sick. This medicine may increase your risk to bruise or bleed. Call your doctor or health care professional if you notice any unusual bleeding. Be careful brushing and flossing your teeth or using a toothpick because you may get an infection or bleed more easily. If you have any dental work done, tell your dentist you are receiving this medicine. Avoid taking products that contain aspirin, acetaminophen, ibuprofen, naproxen, or ketoprofen unless instructed by your doctor. These medicines may hide a fever. Do not become pregnant while taking this medicine. Women should inform their doctor if they wish to become pregnant or think they might be pregnant. There is a potential for serious side effects to an unborn child. Talk to your health care professional or pharmacist for more information. Do not breast-feed an infant while taking this medicine. What side effects may I notice from  receiving this medicine? Side effects that you should report to your doctor or health care professional as soon as possible: -allergic reactions like skin rash, itching or hives, swelling of the face, lips, or tongue -low blood counts - This drug may decrease the number of white blood cells, red blood cells and platelets. You may be at increased risk for infections and bleeding. -signs of infection - fever or chills, cough, sore throat, pain or difficulty passing urine -signs of decreased platelets or bleeding - bruising, pinpoint red spots on the skin, black, tarry stools, nosebleeds -signs of decreased red blood cells - unusually weak or tired, fainting spells, lightheadedness -breathing problems -fast or irregular heartbeat -low blood pressure -mouth sores -nausea and vomiting -pain, swelling, redness or irritation at the injection site -pain, tingling, numbness in the hands or feet -swelling of the ankle, feet, hands -weight gain Side effects that usually do not require medical attention (report to your prescriber or health care professional if they continue or are bothersome): -bone pain -complete hair loss including hair on your head, underarms, pubic hair, eyebrows, and eyelashes -diarrhea -excessive tearing -changes in the color of fingernails -loosening of the fingernails -nausea -muscle pain -red flush to skin -sweating -weak or tired This list may not describe all possible  side effects. Call your doctor for medical advice about side effects. You may report side effects to FDA at 1-800-FDA-1088. Where should I keep my medicine? This drug is given in a hospital or clinic and will not be stored at home. NOTE: This sheet is a summary. It may not cover all possible information. If you have questions about this medicine, talk to your doctor, pharmacist, or health care provider.  2012, [employer]. (06/09/2008 11:52:10 AM)  Carboplatin injection What is this  medicine? CARBOPLATIN (KAR boe pla tin) is a chemotherapy drug. It targets fast dividing cells, like cancer cells, and causes these cells to die. This medicine is used to treat ovarian cancer and many other cancers. This medicine may be used for other purposes; ask your health care provider or pharmacist if you have questions. What should I tell my health care provider before I take this medicine? They need to know if you have any of these conditions: -blood disorders -hearing problems -kidney disease -recent or ongoing radiation therapy -an unusual or allergic reaction to carboplatin, cisplatin, other chemotherapy, other medicines, foods, dyes, or preservatives -pregnant or trying to get pregnant -breast-feeding How should I use this medicine? This drug is usually given as an infusion into a vein. It is administered in a hospital or clinic by a specially trained health care professional. Talk to your pediatrician regarding the use of this medicine in children. Special care may be needed. Overdosage: If you think you have taken too much of this medicine contact a poison control center or emergency room at once. NOTE: This medicine is only for you. Do not share this medicine with others. What if I miss a dose? It is important not to miss a dose. Call your doctor or health care professional if you are unable to keep an appointment. What may interact with this medicine? -medicines for seizures -medicines to increase blood counts like filgrastim, pegfilgrastim, sargramostim -some antibiotics like amikacin, gentamicin, neomycin, streptomycin, tobramycin -vaccines Talk to your doctor or health care professional before taking any of these medicines: -acetaminophen -aspirin -ibuprofen -ketoprofen -naproxen This list may not describe all possible interactions. Give your health care provider a list of all the medicines, herbs, non-prescription drugs, or dietary supplements you use. Also tell them  if you smoke, drink alcohol, or use illegal drugs. Some items may interact with your medicine. What should I watch for while using this medicine? Your condition will be monitored carefully while you are receiving this medicine. You will need important blood work done while you are taking this medicine. This drug may make you feel generally unwell. This is not uncommon, as chemotherapy can affect healthy cells as well as cancer cells. Report any side effects. Continue your course of treatment even though you feel ill unless your doctor tells you to stop. In some cases, you may be given additional medicines to help with side effects. Follow all directions for their use. Call your doctor or health care professional for advice if you get a fever, chills or sore throat, or other symptoms of a cold or flu. Do not treat yourself. This drug decreases your body's ability to fight infections. Try to avoid being around people who are sick. This medicine may increase your risk to bruise or bleed. Call your doctor or health care professional if you notice any unusual bleeding. Be careful brushing and flossing your teeth or using a toothpick because you may get an infection or bleed more easily. If you have any dental  work done, tell your dentist you are receiving this medicine. Avoid taking products that contain aspirin, acetaminophen, ibuprofen, naproxen, or ketoprofen unless instructed by your doctor. These medicines may hide a fever. Do not become pregnant while taking this medicine. Women should inform their doctor if they wish to become pregnant or think they might be pregnant. There is a potential for serious side effects to an unborn child. Talk to your health care professional or pharmacist for more information. Do not breast-feed an infant while taking this medicine. What side effects may I notice from receiving this medicine? Side effects that you should report to your doctor or health care professional as  soon as possible: -allergic reactions like skin rash, itching or hives, swelling of the face, lips, or tongue -signs of infection - fever or chills, cough, sore throat, pain or difficulty passing urine -signs of decreased platelets or bleeding - bruising, pinpoint red spots on the skin, black, tarry stools, nosebleeds -signs of decreased red blood cells - unusually weak or tired, fainting spells, lightheadedness -breathing problems -changes in hearing -changes in vision -chest pain -high blood pressure -low blood counts - This drug may decrease the number of white blood cells, red blood cells and platelets. You may be at increased risk for infections and bleeding. -nausea and vomiting -pain, swelling, redness or irritation at the injection site -pain, tingling, numbness in the hands or feet -problems with balance, talking, walking -trouble passing urine or change in the amount of urine Side effects that usually do not require medical attention (report to your doctor or health care professional if they continue or are bothersome): -hair loss -loss of appetite -metallic taste in the mouth or changes in taste This list may not describe all possible side effects. Call your doctor for medical advice about side effects. You may report side effects to FDA at 1-800-FDA-1088. Where should I keep my medicine? This drug is given in a hospital or clinic and will not be stored at home. NOTE: This sheet is a summary. It may not cover all possible information. If you have questions about this medicine, talk to your doctor, pharmacist, or health care provider.  2012, [employer]. (10/02/2007 2:38:05 PM)  Trastuzumab injection for infusion (Herceptin)  What is this medicine? TRASTUZUMAB (tras TOO zoo mab) is a monoclonal antibody. It targets a protein called HER2. This protein is found in some stomach and breast cancers. This medicine can stop cancer cell growth. This medicine may be used with  other cancer treatments. This medicine may be used for other purposes; ask your health care provider or pharmacist if you have questions. What should I tell my health care provider before I take this medicine? They need to know if you have any of these conditions: -heart disease -heart failure -infection (especially a virus infection such as chickenpox, cold sores, or herpes) -lung or breathing disease, like asthma -recent or ongoing radiation therapy -an unusual or allergic reaction to trastuzumab, benzyl alcohol, or other medications, foods, dyes, or preservatives -pregnant or trying to get pregnant -breast-feeding How should I use this medicine? This drug is given as an infusion into a vein. It is administered in a hospital or clinic by a specially trained health care professional. Talk to your pediatrician regarding the use of this medicine in children. This medicine is not approved for use in children. Overdosage: If you think you have taken too much of this medicine contact a poison control center or emergency room at once. NOTE: This  medicine is only for you. Do not share this medicine with others. What if I miss a dose? It is important not to miss a dose. Call your doctor or health care professional if you are unable to keep an appointment. What may interact with this medicine? -cyclophosphamide -doxorubicin -warfarin This list may not describe all possible interactions. Give your health care provider a list of all the medicines, herbs, non-prescription drugs, or dietary supplements you use. Also tell them if you smoke, drink alcohol, or use illegal drugs. Some items may interact with your medicine. What should I watch for while using this medicine? Visit your doctor for checks on your progress. Report any side effects. Continue your course of treatment even though you feel ill unless your doctor tells you to stop. Call your doctor or health care professional for advice if you get a  fever, chills or sore throat, or other symptoms of a cold or flu. Do not treat yourself. Try to avoid being around people who are sick. You may experience fever, chills and shaking during your first infusion. These effects are usually mild and can be treated with other medicines. Report any side effects during the infusion to your health care professional. Fever and chills usually do not happen with later infusions. What side effects may I notice from receiving this medicine? Side effects that you should report to your doctor or other health care professional as soon as possible: -breathing difficulties -chest pain or palpitations -cough -dizziness or fainting -fever or chills, sore throat -skin rash, itching or hives -swelling of the legs or ankles -unusually weak or tired Side effects that usually do not require medical attention (report to your doctor or other health care professional if they continue or are bothersome): -loss of appetite -headache -muscle aches -nausea This list may not describe all possible side effects. Call your doctor for medical advice about side effects. You may report side effects to FDA at 1-800-FDA-1088. Where should I keep my medicine? This drug is given in a hospital or clinic and will not be stored at home. NOTE: This sheet is a summary. It may not cover all possible information. If you have questions about this medicine, talk to your doctor, pharmacist, or health care provider.  2012, [employer]. (05/01/2009 1:43:15 PM)

## 2012-07-12 NOTE — Telephone Encounter (Signed)
as scheduled Has of 07-13-2012 nothing needs to be scheduled

## 2012-07-12 NOTE — Progress Notes (Signed)
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  PRIOR THERAPY:  #1 patient was originally seen in the multidisciplinary breast clinic when she found a left breast mass she eventually had MRI and mammograms performed. She was diagnosed with invasive ductal carcinoma that was ER positive PR positive HER-2/neu positive measuring 3.1 cm by MRI criteria. Ki-67 was 70% HER-2 was amplified with a ratio 2.91.  #2 patient was seen at the multidisciplinary breast clinic and a recommendation for neoadjuvant chemotherapy was recommended as patient was interested in breast conservation. She will receive neoadjuvant Taxotere carboplatinum Herceptin. Taxotere carboplatinum will be given every 21 days with weekly Herceptin. Total of 6 cycles of Taxotere and carboplatinum will be administered.  #3 TCH cycle 1 started on a 07/12/2012  CURRENT THERAPY:  cycle 1 of TCH on 07/12/2012 with day 2 Neulasta  INTERVAL HISTORY: Melissa [name] 47 y.o. female returns for followup visit today. Overall patient is a she is really without any complaints. She is here to start cycle 1 of TCH today. She denies any fevers chills night sweats headaches shortness of breath chest pains palpitations no myalgias and arthralgias. No bleeding. Her Port-A-Cath looks good. Remainder of the 10 point review of systems is negative.  MEDICAL HISTORY: Past Medical History  Diagnosis Date  . Breast cancer   . Headache   . Back pain   . 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 [place] fever     ALLERGIES:  is allergic to aspirin and iodinated diagnostic agents.  MEDICATIONS:  Current Outpatient Prescriptions  Medication Sig Dispense Refill  .  acetaminophen-codeine (TYLENOL #3) 300-30 MG per tablet Take 1 tablet by mouth every 4 (four) hours as needed.      Marland Kitchen albuterol (VENTOLIN HFA) 108 (90 BASE) MCG/ACT inhaler Inhale 2 puffs into the lungs every 6 (six) hours as needed.        Marland Kitchen amoxicillin (AMOXIL) 500 MG capsule Take 500 mg by mouth 3 (three) times daily.      Marland Kitchen lidocaine-prilocaine (EMLA) cream Apply topically as needed.  30 g  7  . LORazepam (ATIVAN) 0.5 MG tablet Take 1 tablet (0.5 mg total) by mouth every 6 (six) hours as needed (Nausea or vomiting).  30 tablet  0  . ondansetron (ZOFRAN) 8 MG tablet Take 1 tablet (8 mg total) by mouth 2 (two) times daily. Take two times a day starting the day after chemo for 3 days. Then take two times a day as needed for nausea or vomiting.  30 tablet  1  . prochlorperazine (COMPAZINE) 10 MG tablet Take 1 tablet (10 mg total) by mouth every 6 (six) hours as needed (Nausea or vomiting).  30 tablet  1  . prochlorperazine (COMPAZINE) 25 MG suppository Place 1 suppository (25 mg total) rectally every 12 (twelve) hours as needed for nausea.  12 suppository  3  . dexamethasone (DECADRON) 4 MG tablet Take 2 tablets (8 mg total) by mouth 2 (two) times daily with a meal. Take two times a day the day before Taxotere. Then take two times a day starting the day after chemo for 3 days.  30 tablet  1  . etodolac (LODINE) 400 MG tablet Take 1 tablet by  mouth Twice daily as needed.      . traMADol (ULTRAM) 50 MG tablet 1 to 2 every 4 hours as needed        SURGICAL HISTORY:  Past Surgical History  Procedure Date  . 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 Cesarean section 1995/1996  . Lobectomy 1997    upper left  . Portacath placement 07/02/2012    Procedure: INSERTION PORT-A-CATH;  Surgeon: Mariella Saa, MD;  Location: [facility];  Service: General;  Laterality: N/A;  right    REVIEW OF SYSTEMS:  Pertinent items are noted in HPI.   HEALTH  MAINTENANCE:   PHYSICAL EXAMINATION: Blood pressure 113/73, pulse 73, temperature 97.9 F (36.6 C), resp. rate 20, height 5\' 2"  (1.575 m), weight 117 lb 9.6 oz (53.343 kg), last menstrual period 06/22/2012. Body mass index is 21.51 kg/(m^2). ECOG PERFORMANCE STATUS: 0 - Asymptomatic   General appearance: alert, cooperative and appears stated age Neck: no adenopathy, no carotid bruit, no JVD, supple, symmetrical, trachea midline and thyroid not enlarged, symmetric, no tenderness/mass/nodules Lymph nodes: Cervical, supraclavicular, and axillary nodes normal. Resp: clear to auscultation bilaterally Back: symmetric, no curvature. ROM normal. No CVA tenderness. Cardio: regular rate and rhythm GI: soft, non-tender; bowel sounds normal; no masses,  no organomegaly Extremities: extremities normal, atraumatic, no cyanosis or edema Neurologic: Grossly normal Left breast examination reveals a palpable mass measuring about 3 cm in the upper outer quadrant no nipple discharge no inversion or retraction . Right breast no masses or nipple discharge  LABORATORY DATA: Lab Results  Component Value Date   WBC 9.1 07/12/2012   HGB 13.5 07/12/2012   HCT 39.3 07/12/2012   MCV 92.9 07/12/2012   PLT 280 07/12/2012      Chemistry      Component Value Date/Time   NA 140 06/26/2012 0929   NA 141 01/26/2011 1039   K 4.5 06/26/2012 0929   K 4.5 01/26/2011 1039   CL 107 06/26/2012 0929   CL 106 01/26/2011 1039   CO2 26 06/26/2012 0929   CO2 28 01/26/2011 1039   BUN 7.0 06/26/2012 0929   BUN 7 01/26/2011 1039   CREATININE 0.8 06/26/2012 0929   CREATININE 0.6 01/26/2011 1039      Component Value Date/Time   CALCIUM 9.5 06/26/2012 0929   CALCIUM 9.2 01/26/2011 1039   ALKPHOS 69 06/26/2012 0929   AST 15 06/26/2012 0929   ALT 11 06/26/2012 0929   BILITOT 0.20 06/26/2012 0929       RADIOGRAPHIC STUDIES:  US Breast Left  06/05/2012  *RADIOLOGY REPORT*  Clinical Data:  The patient feels a mass in the upper  portion of the left breast.   She had a benign excisional biopsy of the right breast in 2006.  She had a left upper lobectomy due to coccidioidomycosis in 1998.  DIGITAL DIAGNOSTIC BILATERAL MAMMOGRAM WITH CAD AND LEFT BREAST ULTRASOUND:  Comparison:  None.  Findings:  The breast tissue is heterogeneously dense.  There is an irregular mass with suspicious microcalcifications in the upper portion of the left breast.  No abnormality is noted on the right. Mammographic images were processed with CAD.  On physical exam, I palpate a 3 cm firm mass at 12 o'clock 4 cm from the left nipple.  Ultrasound is performed, showing an irregular hypoechoic mass with microcalcifications at 12 o'clock 4 cm from the left nipple measuring 2.5 x 2.6 x 1.5 cm.  Sonography  of the left axilla demonstrates a prominent but not definitely abnormal axillary lymph node.  The appearance is highly suspicious for invasive mammary carcinoma. Biopsy is recommended.  Ultrasound-guided core needle biopsy was discussed with the patient and she agreed with this plan.  IMPRESSION: Highly suspicious mass at 12 o'clock 4 cm from the left nipple measuring 2.5 x 2.6 x 1.5 cm.  RECOMMENDATION: Ultrasound-guided core needle biopsy is recommended.  This will be performed and reported separately.  I have discussed the findings and recommendations with the patient. Results were also provided in writing at the conclusion of the visit.  BI-RADS CATEGORY 5:  Highly suggestive of malignancy - appropriate action should be taken.   Original Report Authenticated By: Cain Saupe, M.D.    Mr Breast Bilateral W Wo Contrast  06/12/2012  Normal *RADIOLOGY REPORT*  Clinical Data: Recently diagnosed left breast invasive mammary carcinoma.  Preoperative evaluation.  BILATERAL BREAST MRI WITH AND WITHOUT CONTRAST  Technique: Multiplanar, multisequence MR images of both breasts were obtained prior to and following the intravenous administration of 9ml of Multihance.  Three  dimensional images were evaluated at the independent DynaCad workstation.  Comparison:  06/05/2012 mammogram and ultrasound.  Findings: There is marked diffuse parenchymal background enhancement.  There is an irregular enhancing mass located within the middle one third of the left breast at the 12 o'clock position with central clip artifact corresponding to the recently diagnosed left breast invasive mammary carcinoma.  The mass measures 3.1 x 2.4 x 1.7 cm in size and is associated with a mixture of plateau and washout enhancement kinetics.  There are no additional worrisome enhancing foci within either breast.  There is no evidence for axillary or internal mammary adenopathy and there are no additional findings.  IMPRESSION: 3.1 cm irregular enhancing mass located within the left breast at 12 o'clock position corresponding to the recently diagnosed left breast invasive mammary carcinoma.  No evidence for adenopathy and no additional findings.  RECOMMENDATION:  Treatment plan  THREE-DIMENSIONAL MR IMAGE RENDERING ON INDEPENDENT WORKSTATION:  Three-dimensional MR images were rendered by post-processing of the original MR data on an independent workstation.  The three- dimensional MR images were interpreted, and findings were reported in the accompanying complete MRI report for this study.  BI-RADS CATEGORY 6:  Known biopsy-proven malignancy - appropriate action should be taken.   Original Report Authenticated By: Rolla Plate, M.D.    [name] Core Biopsy  06/05/2012  *RADIOLOGY REPORT*  Clinical Data:  Palpable mass at 12 o'clock 4 cm from the left nipple.  ULTRASOUND GUIDED VACUUM ASSISTED CORE BIOPSY OF THE LEFT BREAST  The patient and I discussed the procedure of ultrasound-guided biopsy, including benefits and alternatives.  We discussed the high likelihood of a successful procedure. We discussed the risks of the procedure including infection, bleeding, tissue injury, clip migration, and inadequate sampling.   Informed written consent was given.  Using sterile technique, 2% lidocaine, 2% lidocaine with epinephrine, ultrasound guidance, and a 12 gauge vacuum assisted needle, biopsy was performed of the mass at 12 o'clock 4 cm from the left nipple using a lateromedial approach.  At the conclusion of the procedure, a ribbon tissue marker clip was deployed into the biopsy cavity.  Follow-up 2-view mammogram was performed and dictated separately.  IMPRESSION: Ultrasound-guided biopsy of the mass at 12 o'clock 4 cm from the left nipple.  No apparent complications.   Original Report Authenticated By: Cain Saupe, M.D.    Mm Digital Diagnostic Bilat  06/05/2012  *  RADIOLOGY REPORT*  Clinical Data:  The patient feels a mass in the upper portion of the left breast.   She had a benign excisional biopsy of the right breast in 2006.  She had a left upper lobectomy due to coccidioidomycosis in 1998.  DIGITAL DIAGNOSTIC BILATERAL MAMMOGRAM WITH CAD AND LEFT BREAST ULTRASOUND:  Comparison:  None.  Findings:  The breast tissue is heterogeneously dense.  There is an irregular mass with suspicious microcalcifications in the upper portion of the left breast.  No abnormality is noted on the right. Mammographic images were processed with CAD.  On physical exam, I palpate a 3 cm firm mass at 12 o'clock 4 cm from the left nipple.  Ultrasound is performed, showing an irregular hypoechoic mass with microcalcifications at 12 o'clock 4 cm from the left nipple measuring 2.5 x 2.6 x 1.5 cm.  Sonography of the left axilla demonstrates a prominent but not definitely abnormal axillary lymph node.  The appearance is highly suspicious for invasive mammary carcinoma. Biopsy is recommended.  Ultrasound-guided core needle biopsy was discussed with the patient and she agreed with this plan.  IMPRESSION: Highly suspicious mass at 12 o'clock 4 cm from the left nipple measuring 2.5 x 2.6 x 1.5 cm.  RECOMMENDATION: Ultrasound-guided core needle biopsy is  recommended.  This will be performed and reported separately.  I have discussed the findings and recommendations with the patient. Results were also provided in writing at the conclusion of the visit.  BI-RADS CATEGORY 5:  Highly suggestive of malignancy - appropriate action should be taken.   Original Report Authenticated By: Cain Saupe, M.D.    Mm Digital Diagnostic Unilat L  06/05/2012  *RADIOLOGY REPORT*  Clinical Data:  Ultrasound-guided core needle biopsy of a mass at 12 o'clock 4 cm from the left nipple with clip placement.  DIGITAL DIAGNOSTIC LEFT MAMMOGRAM  Comparison:  None.  Findings:  Films are performed following ultrasound guided biopsy of a mass at 12 o'clock 4 cm from the left nipple.  The ribbon clip is appropriately positioned.  IMPRESSION: Appropriate clip placement following ultrasound-guided core needle biopsy of a mass at 12 o'clock 4 cm from the left nipple.   Original Report Authenticated By: Cain Saupe, M.D.    Mm Radiologist Eval And Mgmt  06/06/2012  *RADIOLOGY REPORT*  ESTABLISHED PATIENT OFFICE VISIT - LEVEL II [phone])  Chief Complaint:  The patient presented with a palpable left breast mass.  History:  Ultrasound guided core biopsy of the left breast mass was performed on 06/05/2012.  Exam:  The wound site is clean and dry with no signs of inflammation or hematoma.  Results of the biopsy were discussed with the patient.  Pathology: Invasive ductal carcinoma was reported histologically. This corresponds well with the imaging findings.  Assessment and Plan:  The patient will be seen at the Multidisciplinary Clinic on 06/13/2012.  Breast MRI has been scheduled.  Informational reading material was provided to the patient.   Original Report Authenticated By: Baird Lyons, M.D.     ASSESSMENT: 47 year old female with  #1 stage II a (3.1 cm) invasive ductal carcinoma of the left breast status post needle core biopsy that showed ER positive PR positive HER-2/neu positive  breast cancer with a Ki-67 at 70%. Patient is now going to receive neoadjuvant chemotherapy and she desires breast conservation. She was originally seen in the multidisciplinary breast clinic.  #2 patient will now begin chemotherapy consisting of Taxotere carboplatinum and Herceptin neoadjuvant. Risks and benefits of her chemotherapy  were explained to her completely. She has had an echocardiogram performed as well as chemotherapy teaching class.  PLAN:  #1 proceed with day 1 cycle 1 of TCH. She knows how to take her antiemetics.  #2 she will return in one week's time for Herceptin only.  All questions were answered. The patient knows to call the clinic with any problems, questions or concerns. We can certainly see the patient much sooner if necessary.  I spent 25 minutes counseling the patient face to face. The total time spent in the appointment was 30 minutes.    Drue Second, MD Medical/Oncology [facility] [phone] (beeper) [phone] (Office)  07/12/2012, 9:50 AM

## 2012-07-13 ENCOUNTER — Telehealth: Payer: Self-pay | Admitting: *Deleted

## 2012-07-13 ENCOUNTER — Ambulatory Visit (HOSPITAL_BASED_OUTPATIENT_CLINIC_OR_DEPARTMENT_OTHER): Payer: BC Managed Care – PPO

## 2012-07-13 VITALS — BP 107/64 | HR 77 | Temp 97.1°F | Resp 18

## 2012-07-13 DIAGNOSIS — C50219 Malignant neoplasm of upper-inner quadrant of unspecified female breast: Secondary | ICD-10-CM

## 2012-07-13 DIAGNOSIS — Z5189 Encounter for other specified aftercare: Secondary | ICD-10-CM

## 2012-07-13 MED ORDER — PEGFILGRASTIM INJECTION 6 MG/0.6ML
6.0000 mg | Freq: Once | SUBCUTANEOUS | Status: AC
  Administered 2012-07-13: 6 mg via SUBCUTANEOUS
  Filled 2012-07-13: qty 0.6

## 2012-07-13 NOTE — Telephone Encounter (Signed)
In office today for Neulasta injection.

## 2012-07-16 ENCOUNTER — Telehealth: Payer: Self-pay | Admitting: *Deleted

## 2012-07-16 MED ORDER — MAGIC MOUTHWASH: 15.0000 mL | Freq: Three times a day (TID) | ORAL | Status: DC | PRN

## 2012-07-16 NOTE — Telephone Encounter (Signed)
May have magic mouth wash prescripton

## 2012-07-16 NOTE — Telephone Encounter (Signed)
Per MD, Rx for Magic Mouthwash called into pt's Pharmacy.

## 2012-07-16 NOTE — Telephone Encounter (Signed)
Pt called [facility] states " I had chemo on Thursday ( Taxol/Carbo) and injection ( Neulasta)  Friday. I've noticed the right side of my mouth has some sores and it's very tender."  Called pt who advised she is using Biotene, salt water rinses, not eating anything with citrus. Discussed with pt I will review concerns with MD. Pt verbalized understanding. No further questions

## 2012-07-19 ENCOUNTER — Encounter: Payer: Self-pay | Admitting: Adult Health

## 2012-07-19 ENCOUNTER — Ambulatory Visit (HOSPITAL_BASED_OUTPATIENT_CLINIC_OR_DEPARTMENT_OTHER): Payer: BC Managed Care – PPO | Admitting: Adult Health

## 2012-07-19 ENCOUNTER — Ambulatory Visit (HOSPITAL_BASED_OUTPATIENT_CLINIC_OR_DEPARTMENT_OTHER): Payer: BC Managed Care – PPO

## 2012-07-19 ENCOUNTER — Other Ambulatory Visit (HOSPITAL_BASED_OUTPATIENT_CLINIC_OR_DEPARTMENT_OTHER): Payer: BC Managed Care – PPO | Admitting: Lab

## 2012-07-19 VITALS — BP 113/75 | HR 82 | Temp 98.1°F | Resp 20 | Ht 62.0 in | Wt 116.6 lb

## 2012-07-19 DIAGNOSIS — C50919 Malignant neoplasm of unspecified site of unspecified female breast: Secondary | ICD-10-CM

## 2012-07-19 DIAGNOSIS — Z5112 Encounter for antineoplastic immunotherapy: Secondary | ICD-10-CM

## 2012-07-19 DIAGNOSIS — C50219 Malignant neoplasm of upper-inner quadrant of unspecified female breast: Secondary | ICD-10-CM

## 2012-07-19 DIAGNOSIS — Z170 Estrogen receptor positive status [ER+]: Secondary | ICD-10-CM

## 2012-07-19 DIAGNOSIS — E860 Dehydration: Secondary | ICD-10-CM

## 2012-07-19 DIAGNOSIS — B370 Candidal stomatitis: Secondary | ICD-10-CM

## 2012-07-19 LAB — CBC WITH DIFFERENTIAL/PLATELET
Basophils Absolute: 0 10*3/uL (ref 0.0–0.1)
Eosinophils Absolute: 0.2 10*3/uL (ref 0.0–0.5)
HCT: 37.3 % (ref 34.8–46.6)
HGB: 13 g/dL (ref 11.6–15.9)
MCV: 91.2 fL (ref 79.5–101.0)
NEUT#: 12.9 10*3/uL — ABNORMAL HIGH (ref 1.5–6.5)
RDW: 13.7 % (ref 11.2–14.5)
lymph#: 2.7 10*3/uL (ref 0.9–3.3)

## 2012-07-19 LAB — COMPREHENSIVE METABOLIC PANEL (CC13)
Albumin: 3.7 g/dL (ref 3.5–5.0)
CO2: 26 mEq/L (ref 22–29)
Calcium: 9.4 mg/dL (ref 8.4–10.4)
Glucose: 83 mg/dl (ref 70–99)
Potassium: 3.7 mEq/L (ref 3.5–5.1)
Sodium: 136 mEq/L (ref 136–145)
Total Protein: 7 g/dL (ref 6.4–8.3)

## 2012-07-19 MED ORDER — FLUCONAZOLE 200 MG PO TABS: 200.0000 mg | ORAL_TABLET | Freq: Every day | ORAL | Status: DC

## 2012-07-19 MED ORDER — SODIUM CHLORIDE 0.9 % IV SOLN
Freq: Once | INTRAVENOUS | Status: AC
  Administered 2012-07-19: 11:00:00 via INTRAVENOUS

## 2012-07-19 MED ORDER — TRASTUZUMAB CHEMO INJECTION 440 MG
2.0000 mg/kg | Freq: Once | INTRAVENOUS | Status: AC
  Administered 2012-07-19: 105 mg via INTRAVENOUS
  Filled 2012-07-19: qty 5

## 2012-07-19 MED ORDER — DIPHENHYDRAMINE HCL 25 MG PO CAPS
50.0000 mg | ORAL_CAPSULE | Freq: Once | ORAL | Status: AC
  Administered 2012-07-19: 50 mg via ORAL

## 2012-07-19 MED ORDER — ACETAMINOPHEN 325 MG PO TABS
650.0000 mg | ORAL_TABLET | Freq: Once | ORAL | Status: AC
  Administered 2012-07-19: 650 mg via ORAL

## 2012-07-19 NOTE — Patient Instructions (Signed)
[facility] Discharge Instructions for Patients Receiving Chemotherapy  Today you received the following chemotherapy agents Herceptin To help prevent nausea and vomiting after your treatment, we encourage you to take your nausea medication as prescribed.  If you develop nausea and vomiting that is not controlled by your nausea medication, call the clinic. If it is after clinic hours your family physician or the after hours number for the clinic or go to the Emergency Department.   BELOW ARE SYMPTOMS THAT SHOULD BE REPORTED IMMEDIATELY:  *FEVER GREATER THAN 100.5 F  *CHILLS WITH OR WITHOUT FEVER  NAUSEA AND VOMITING THAT IS NOT CONTROLLED WITH YOUR NAUSEA MEDICATION  *UNUSUAL SHORTNESS OF BREATH  *UNUSUAL BRUISING OR BLEEDING  TENDERNESS IN MOUTH AND THROAT WITH OR WITHOUT PRESENCE OF ULCERS  *URINARY PROBLEMS  *BOWEL PROBLEMS  UNUSUAL RASH Items with * indicate a potential emergency and should be followed up as soon as possible.  One of the nurses will contact you 24 hours after your treatment. Please let the nurse know about any problems that you may have experienced. Feel free to call the clinic you have any questions or concerns. The clinic phone number is [phone].   I have been informed and understand all the instructions given to me. I know to contact the clinic, my physician, or go to the Emergency Department if any problems should occur. I do not have any questions at this time, but understand that I may call the clinic during office hours   should I have any questions or need assistance in obtaining follow up care.    __________________________________________  _____________  __________ Signature of Patient or Authorized Representative            Date                   Time    __________________________________________ Nurse's Signature    

## 2012-07-19 NOTE — Addendum Note (Signed)
Addended by: ALEXANDER, LINDSEY C on: 07/19/2012 03:46 PM   Modules accepted: Level of Service  

## 2012-07-19 NOTE — Progress Notes (Signed)
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  PRIOR THERAPY:  #1 patient was originally seen in the multidisciplinary breast clinic when she found a left breast mass she eventually had MRI and mammograms performed. She was diagnosed with invasive ductal carcinoma that was ER positive PR positive HER-2/neu positive measuring 3.1 cm by MRI criteria. Ki-67 was 70% HER-2 was amplified with a ratio 2.91.  #2 patient was seen at the multidisciplinary breast clinic and a recommendation for neoadjuvant chemotherapy was recommended as patient was interested in breast conservation. She will receive neoadjuvant Taxotere carboplatinum Herceptin. Taxotere carboplatinum will be given every 21 days with weekly Herceptin. Total of 6 cycles of Taxotere and carboplatinum will be administered.  CURRENT THERAPY: Cycle 1 Day 8 TCH with Neulasta support, weekly Herceptin  INTERVAL HISTORY: Melissa [name] 47 y.o. female returns for followup visit today. She had her [facility], and is feeling very weak today.  She's had a headache, decreased oral intake, decreased fluid intake.  She's drinking about 32 oz of water, one ensure/day, and eating soups when able.  Her mouth and throat are sore.  She has had a hard time sleeping the past couple of nights as well.  She has had diarrhea 3x day, which is watery in nature.  Her husband recently had the same thing.  She denies fevers, chills, nausea, vomiting, or any other concerns.    MEDICAL HISTORY: Past Medical History  Diagnosis Date  . Breast cancer   . Headache   . Back pain   . 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 [place] fever     ALLERGIES:  is allergic to aspirin and iodinated diagnostic  agents.  MEDICATIONS:  Current Outpatient Prescriptions  Medication Sig Dispense Refill  . albuterol (VENTOLIN HFA) 108 (90 BASE) MCG/ACT inhaler Inhale 2 puffs into the lungs every 6 (six) hours as needed.        . Alum & Mag Hydroxide-Simeth (MAGIC MOUTHWASH) SOLN Take 15 mLs by mouth 3 (three) times daily as needed.  240 mL  3  . amoxicillin (AMOXIL) 500 MG capsule Take 500 mg by mouth 3 (three) times daily.      Marland Kitchen dexamethasone (DECADRON) 4 MG tablet Take 2 tablets (8 mg total) by mouth 2 (two) times daily with a meal. Take two times a day the day before Taxotere. Then take two times a day starting the day after chemo for 3 days.  30 tablet  4  . lidocaine-prilocaine (EMLA) cream Apply topically as needed.  30 g  7  . LORazepam (ATIVAN) 0.5 MG tablet Take 1 tablet (0.5 mg total) by mouth every 6 (six) hours as needed (Nausea or vomiting).  30 tablet  0  . ondansetron (ZOFRAN) 8 MG tablet Take 1 tablet (8 mg total) by mouth 2 (two) times daily. Take two times a day starting the day after chemo for 3 days. Then take two times a day as needed for nausea or vomiting.  30 tablet  1  . prochlorperazine (COMPAZINE) 10 MG tablet Take 1 tablet (10 mg total) by mouth every 6 (six) hours as needed (Nausea or vomiting).  30 tablet  1  . prochlorperazine (COMPAZINE) 25 MG suppository Place 1 suppository (  25 mg total) rectally every 12 (twelve) hours as needed for nausea.  12 suppository  3  . valACYclovir (VALTREX) 500 MG tablet Take 1 daily  30 tablet  3  . etodolac (LODINE) 400 MG tablet Take 1 tablet by mouth Twice daily as needed.      . traMADol (ULTRAM) 50 MG tablet 1 to 2 every 4 hours as needed        SURGICAL HISTORY:  Past Surgical History  Procedure Date  . 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 Cesarean section 1995/1996  . Lobectomy 1997    upper left  . Portacath placement 07/02/2012    Procedure: INSERTION PORT-A-CATH;  Surgeon: Mariella Saa, MD;   Location: [facility];  Service: General;  Laterality: N/A;  right    REVIEW OF SYSTEMS:  General: fatigue (+), night sweats (-), fever (-), pain (-) Lymph: palpable nodes (-) HEENT: vision changes (-), mucositis (-), gum bleeding (-), epistaxis (-) Cardiovascular: chest pain (-), palpitations (-) Pulmonary: shortness of breath (-), dyspnea on exertion (-), cough (-), hemoptysis (-) GI:  Early satiety (-), melena (-), dysphagia (-), nausea/vomiting (-), diarrhea (+) GU: dysuria (-), hematuria (-), incontinence (-) Musculoskeletal: joint swelling (-), joint pain (-), back pain (-) Neuro: weakness (+), numbness (-), headache (-), confusion (-) Skin: Rash (-), lesions (-), dryness (-) Psych: depression (-), suicidal/homicidal ideation (-), feeling of hopelessness (-)   PHYSICAL EXAMINATION: Blood pressure 113/75, pulse 82, temperature 98.1 F (36.7 C), resp. rate 20, height 5\' 2"  (1.575 m), weight 116 lb 9.6 oz (52.889 kg), last menstrual period 06/22/2012. Body mass index is 21.33 kg/(m^2). General: Patient is a well appearing female in no acute distress HEENT: PERRLA, sclerae anicteric no conjunctival pallor, MMM, thrush in posterior pharynx Neck: supple, no palpable adenopathy Lungs: clear to auscultation bilaterally, no wheezes, rhonchi, or rales Cardiovascular: regular rate rhythm, S1, S2, no murmurs, rubs or gallops Abdomen: Soft, non-tender, non-distended, normoactive bowel sounds, no HSM Extremities: warm and well perfused, no clubbing, cyanosis, or edema Skin: No rashes or lesions Neuro: Non-focal Left breast examination reveals a palpable mass measuring about 3 cm in the upper outer quadrant no nipple discharge no inversion or retraction . Right breast no masses or nipple discharge ECOG PERFORMANCE STATUS: 0 - Asymptomatic  LABORATORY DATA: Lab Results  Component Value Date   WBC 18.5* 07/19/2012   HGB 13.0 07/19/2012   HCT 37.3 07/19/2012   MCV 91.2 07/19/2012    PLT 217 07/19/2012      Chemistry      Component Value Date/Time   NA 139 07/12/2012 0925   NA 141 01/26/2011 1039   K 4.2 07/12/2012 0925   K 4.5 01/26/2011 1039   CL 105 07/12/2012 0925   CL 106 01/26/2011 1039   CO2 24 07/12/2012 0925   CO2 28 01/26/2011 1039   BUN 9.0 07/12/2012 0925   BUN 7 01/26/2011 1039   CREATININE 0.7 07/12/2012 0925   CREATININE 0.6 01/26/2011 1039      Component Value Date/Time   CALCIUM 9.6 07/12/2012 0925   CALCIUM 9.2 01/26/2011 1039   ALKPHOS 65 07/12/2012 0925   AST 17 07/12/2012 0925   ALT 20 07/12/2012 0925   BILITOT 0.28 07/12/2012 0925       RADIOGRAPHIC STUDIES:  US Breast Left  06/05/2012  *RADIOLOGY REPORT*  Clinical Data:  The patient feels a mass in the upper portion of the left breast.  She had a benign excisional biopsy of the right breast in 2006.  She had a left upper lobectomy due to coccidioidomycosis in 1998.  DIGITAL DIAGNOSTIC BILATERAL MAMMOGRAM WITH CAD AND LEFT BREAST ULTRASOUND:  Comparison:  None.  Findings:  The breast tissue is heterogeneously dense.  There is an irregular mass with suspicious microcalcifications in the upper portion of the left breast.  No abnormality is noted on the right. Mammographic images were processed with CAD.  On physical exam, I palpate a 3 cm firm mass at 12 o'clock 4 cm from the left nipple.  Ultrasound is performed, showing an irregular hypoechoic mass with microcalcifications at 12 o'clock 4 cm from the left nipple measuring 2.5 x 2.6 x 1.5 cm.  Sonography of the left axilla demonstrates a prominent but not definitely abnormal axillary lymph node.  The appearance is highly suspicious for invasive mammary carcinoma. Biopsy is recommended.  Ultrasound-guided core needle biopsy was discussed with the patient and she agreed with this plan.  IMPRESSION: Highly suspicious mass at 12 o'clock 4 cm from the left nipple measuring 2.5 x 2.6 x 1.5 cm.  RECOMMENDATION: Ultrasound-guided core needle biopsy is recommended.  This will be  performed and reported separately.  I have discussed the findings and recommendations with the patient. Results were also provided in writing at the conclusion of the visit.  BI-RADS CATEGORY 5:  Highly suggestive of malignancy - appropriate action should be taken.   Original Report Authenticated By: Cain Saupe, M.D.    Mr Breast Bilateral W Wo Contrast  06/12/2012  Normal *RADIOLOGY REPORT*  Clinical Data: Recently diagnosed left breast invasive mammary carcinoma.  Preoperative evaluation.  BILATERAL BREAST MRI WITH AND WITHOUT CONTRAST  Technique: Multiplanar, multisequence MR images of both breasts were obtained prior to and following the intravenous administration of 9ml of Multihance.  Three dimensional images were evaluated at the independent DynaCad workstation.  Comparison:  06/05/2012 mammogram and ultrasound.  Findings: There is marked diffuse parenchymal background enhancement.  There is an irregular enhancing mass located within the middle one third of the left breast at the 12 o'clock position with central clip artifact corresponding to the recently diagnosed left breast invasive mammary carcinoma.  The mass measures 3.1 x 2.4 x 1.7 cm in size and is associated with a mixture of plateau and washout enhancement kinetics.  There are no additional worrisome enhancing foci within either breast.  There is no evidence for axillary or internal mammary adenopathy and there are no additional findings.  IMPRESSION: 3.1 cm irregular enhancing mass located within the left breast at 12 o'clock position corresponding to the recently diagnosed left breast invasive mammary carcinoma.  No evidence for adenopathy and no additional findings.  RECOMMENDATION:  Treatment plan  THREE-DIMENSIONAL MR IMAGE RENDERING ON INDEPENDENT WORKSTATION:  Three-dimensional MR images were rendered by post-processing of the original MR data on an independent workstation.  The three- dimensional MR images were interpreted, and  findings were reported in the accompanying complete MRI report for this study.  BI-RADS CATEGORY 6:  Known biopsy-proven malignancy - appropriate action should be taken.   Original Report Authenticated By: Rolla Plate, M.D.    [name] Core Biopsy  06/05/2012  *RADIOLOGY REPORT*  Clinical Data:  Palpable mass at 12 o'clock 4 cm from the left nipple.  ULTRASOUND GUIDED VACUUM ASSISTED CORE BIOPSY OF THE LEFT BREAST  The patient and I discussed the procedure of ultrasound-guided biopsy, including benefits and alternatives.  We discussed the high likelihood of a successful  procedure. We discussed the risks of the procedure including infection, bleeding, tissue injury, clip migration, and inadequate sampling.  Informed written consent was given.  Using sterile technique, 2% lidocaine, 2% lidocaine with epinephrine, ultrasound guidance, and a 12 gauge vacuum assisted needle, biopsy was performed of the mass at 12 o'clock 4 cm from the left nipple using a lateromedial approach.  At the conclusion of the procedure, a ribbon tissue marker clip was deployed into the biopsy cavity.  Follow-up 2-view mammogram was performed and dictated separately.  IMPRESSION: Ultrasound-guided biopsy of the mass at 12 o'clock 4 cm from the left nipple.  No apparent complications.   Original Report Authenticated By: Cain Saupe, M.D.    Mm Digital Diagnostic Bilat  06/05/2012  *RADIOLOGY REPORT*  Clinical Data:  The patient feels a mass in the upper portion of the left breast.   She had a benign excisional biopsy of the right breast in 2006.  She had a left upper lobectomy due to coccidioidomycosis in 1998.  DIGITAL DIAGNOSTIC BILATERAL MAMMOGRAM WITH CAD AND LEFT BREAST ULTRASOUND:  Comparison:  None.  Findings:  The breast tissue is heterogeneously dense.  There is an irregular mass with suspicious microcalcifications in the upper portion of the left breast.  No abnormality is noted on the right. Mammographic images were  processed with CAD.  On physical exam, I palpate a 3 cm firm mass at 12 o'clock 4 cm from the left nipple.  Ultrasound is performed, showing an irregular hypoechoic mass with microcalcifications at 12 o'clock 4 cm from the left nipple measuring 2.5 x 2.6 x 1.5 cm.  Sonography of the left axilla demonstrates a prominent but not definitely abnormal axillary lymph node.  The appearance is highly suspicious for invasive mammary carcinoma. Biopsy is recommended.  Ultrasound-guided core needle biopsy was discussed with the patient and she agreed with this plan.  IMPRESSION: Highly suspicious mass at 12 o'clock 4 cm from the left nipple measuring 2.5 x 2.6 x 1.5 cm.  RECOMMENDATION: Ultrasound-guided core needle biopsy is recommended.  This will be performed and reported separately.  I have discussed the findings and recommendations with the patient. Results were also provided in writing at the conclusion of the visit.  BI-RADS CATEGORY 5:  Highly suggestive of malignancy - appropriate action should be taken.   Original Report Authenticated By: Cain Saupe, M.D.    Mm Digital Diagnostic Unilat L  06/05/2012  *RADIOLOGY REPORT*  Clinical Data:  Ultrasound-guided core needle biopsy of a mass at 12 o'clock 4 cm from the left nipple with clip placement.  DIGITAL DIAGNOSTIC LEFT MAMMOGRAM  Comparison:  None.  Findings:  Films are performed following ultrasound guided biopsy of a mass at 12 o'clock 4 cm from the left nipple.  The ribbon clip is appropriately positioned.  IMPRESSION: Appropriate clip placement following ultrasound-guided core needle biopsy of a mass at 12 o'clock 4 cm from the left nipple.   Original Report Authenticated By: Cain Saupe, M.D.    Mm Radiologist Eval And Mgmt  06/06/2012  *RADIOLOGY REPORT*  ESTABLISHED PATIENT OFFICE VISIT - LEVEL II [phone])  Chief Complaint:  The patient presented with a palpable left breast mass.  History:  Ultrasound guided core biopsy of the left breast mass  was performed on 06/05/2012.  Exam:  The wound site is clean and dry with no signs of inflammation or hematoma.  Results of the biopsy were discussed with the patient.  Pathology: Invasive ductal carcinoma was reported histologically. This corresponds well  with the imaging findings.  Assessment and Plan:  The patient will be seen at the Multidisciplinary Clinic on 06/13/2012.  Breast MRI has been scheduled.  Informational reading material was provided to the patient.   Original Report Authenticated By: Baird Lyons, M.D.     ASSESSMENT: 47 year old female with  #1 stage II a (3.1 cm) invasive ductal carcinoma of the left breast status post needle core biopsy that showed ER positive PR positive HER-2/neu positive breast cancer with a Ki-67 at 70%. Patient is now going to receive neoadjuvant chemotherapy and she desires breast conservation. She was originally seen in the multidisciplinary breast clinic.  #2 she will begin her chemotherapy on 07/13/2011 after she has her Port-A-Cath placed next week she has had her chemotherapy teaching class as well as echocardiogram. She has also been seen by the cardiology.  #3 Dehydration  #4 Thrush  PLAN:   #1 Ms. Nation tolerated the [facility] essentially well.  She is not neutropenic.  Her hemoglobin and plt count are normal.    #2 I have given her a prescription for Fluconazole for her thrush.  She will continue her Valtrex, magic mouthwash, and saline rinses.  We discussed denture care and removing her dentures completely after each meal and rinsing out her mouth.    #3 She will proceed with the Herceptin today and also receive IV fluids.  I have encouraged increased intake of fluids.  Her headache likely has a dehydration component.  Additionally, should she have continued diarrhea, or should it worsen we will send a C diff, due to her long term use of Amoxicillin.  It is likely due to her recent contact with her husband who had the same thing.    #4 all questions  were answered today, I will see her back next week for her next week of Herceptin.  All questions were answered. The patient knows to call the clinic with any problems, questions or concerns. We can certainly see the patient much sooner if necessary.  I spent 25 minutes counseling the patient face to face. The total time spent in the appointment was 30 minutes.    Cherie Ouch Lyn Hollingshead, NP Medical Oncology [facility] Phone: [phone]    07/19/2012, 9:27 AM

## 2012-07-19 NOTE — Patient Instructions (Addendum)
Doing well.  Proceed with Herceptin and IV fluids. I have prescribed Fluconazole for you.  Please take one tablet daily for 5 days.  We will see you back in one week.  Please call us if you have any questions or concerns.

## 2012-07-23 ENCOUNTER — Telehealth: Payer: Self-pay | Admitting: *Deleted

## 2012-07-23 NOTE — Telephone Encounter (Signed)
Pt called to notify ppwk dropped off for MD concerning FMLA. Pt advised she gave to Receptionist. Discussed with pt ppwk will be faxed by managed care to pt's employer after MD signs. Tis process can take up to 1-2wks. Pt verbalized understanding. No further questions.

## 2012-07-26 ENCOUNTER — Other Ambulatory Visit (HOSPITAL_BASED_OUTPATIENT_CLINIC_OR_DEPARTMENT_OTHER): Payer: BC Managed Care – PPO | Admitting: Lab

## 2012-07-26 ENCOUNTER — Ambulatory Visit (HOSPITAL_BASED_OUTPATIENT_CLINIC_OR_DEPARTMENT_OTHER): Payer: BC Managed Care – PPO

## 2012-07-26 ENCOUNTER — Ambulatory Visit (HOSPITAL_BASED_OUTPATIENT_CLINIC_OR_DEPARTMENT_OTHER): Payer: BC Managed Care – PPO | Admitting: Adult Health

## 2012-07-26 ENCOUNTER — Encounter: Payer: Self-pay | Admitting: Adult Health

## 2012-07-26 VITALS — BP 105/72 | HR 137 | Temp 98.2°F | Resp 20 | Ht 62.0 in | Wt 116.8 lb

## 2012-07-26 DIAGNOSIS — Z5112 Encounter for antineoplastic immunotherapy: Secondary | ICD-10-CM

## 2012-07-26 DIAGNOSIS — Z170 Estrogen receptor positive status [ER+]: Secondary | ICD-10-CM

## 2012-07-26 DIAGNOSIS — C50219 Malignant neoplasm of upper-inner quadrant of unspecified female breast: Secondary | ICD-10-CM

## 2012-07-26 DIAGNOSIS — C50919 Malignant neoplasm of unspecified site of unspecified female breast: Secondary | ICD-10-CM

## 2012-07-26 LAB — CBC WITH DIFFERENTIAL/PLATELET
Eosinophils Absolute: 0 10*3/uL (ref 0.0–0.5)
MCV: 93.5 fL (ref 79.5–101.0)
MONO#: 0.6 10*3/uL (ref 0.1–0.9)
MONO%: 6.1 % (ref 0.0–14.0)
NEUT#: 6.6 10*3/uL — ABNORMAL HIGH (ref 1.5–6.5)
RBC: 3.83 10*6/uL (ref 3.70–5.45)
RDW: 14.2 % (ref 11.2–14.5)
WBC: 9.9 10*3/uL (ref 3.9–10.3)
nRBC: 0 % (ref 0–0)

## 2012-07-26 MED ORDER — HEPARIN SOD (PORK) LOCK FLUSH 100 UNIT/ML IV SOLN
500.0000 [IU] | Freq: Once | INTRAVENOUS | Status: AC | PRN
  Administered 2012-07-26: 500 [IU]
  Filled 2012-07-26: qty 5

## 2012-07-26 MED ORDER — ACETAMINOPHEN 325 MG PO TABS
650.0000 mg | ORAL_TABLET | Freq: Once | ORAL | Status: AC
  Administered 2012-07-26: 650 mg via ORAL

## 2012-07-26 MED ORDER — SODIUM CHLORIDE 0.9 % IV SOLN
Freq: Once | INTRAVENOUS | Status: AC
  Administered 2012-07-26: 12:00:00 via INTRAVENOUS

## 2012-07-26 MED ORDER — TRASTUZUMAB CHEMO INJECTION 440 MG
2.0000 mg/kg | Freq: Once | INTRAVENOUS | Status: AC
  Administered 2012-07-26: 105 mg via INTRAVENOUS
  Filled 2012-07-26: qty 5

## 2012-07-26 MED ORDER — SODIUM CHLORIDE 0.9 % IJ SOLN
10.0000 mL | INTRAMUSCULAR | Status: DC | PRN
  Administered 2012-07-26: 10 mL
  Filled 2012-07-26: qty 10

## 2012-07-26 MED ORDER — DIPHENHYDRAMINE HCL 25 MG PO CAPS
50.0000 mg | ORAL_CAPSULE | Freq: Once | ORAL | Status: AC
  Administered 2012-07-26: 50 mg via ORAL

## 2012-07-26 NOTE — Progress Notes (Signed)
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  PRIOR THERAPY:  #1 patient was originally seen in the multidisciplinary breast clinic when she found a left breast mass she eventually had MRI and mammograms performed. She was diagnosed with invasive ductal carcinoma that was ER positive PR positive HER-2/neu positive measuring 3.1 cm by MRI criteria. Ki-67 was 70% HER-2 was amplified with a ratio 2.91.  #2 patient was seen at the multidisciplinary breast clinic and a recommendation for neoadjuvant chemotherapy was recommended as patient was interested in breast conservation. She will receive neoadjuvant Taxotere carboplatinum Herceptin. Taxotere carboplatinum will be given every 21 days with weekly Herceptin. Total of 6 cycles of Taxotere and carboplatinum will be administered.  CURRENT THERAPY: Cycle 1 Day 15 TCH with Neulasta support, weekly Herceptin  INTERVAL HISTORY: Melissa [name] 47 y.o. female returns for followup visit today.  She is much improved from last week.  She's well and her diarrhea, dehydration, and thrush have resolved.  She does have a strange feeling at the crown of her head, but otherwise a 10 point ROS is neg.   MEDICAL HISTORY: Past Medical History  Diagnosis Date  . Breast cancer   . Headache   . Back pain   . 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 [place] fever     ALLERGIES:  is allergic to aspirin and iodinated diagnostic agents.  MEDICATIONS:  Current Outpatient Prescriptions  Medication Sig Dispense Refill  . albuterol (VENTOLIN HFA) 108 (90 BASE) MCG/ACT inhaler Inhale 2 puffs into the lungs every 6 (six) hours as needed.        . Alum & Mag Hydroxide-Simeth (MAGIC MOUTHWASH)  SOLN Take 15 mLs by mouth 3 (three) times daily as needed.  240 mL  3  . amoxicillin (AMOXIL) 500 MG capsule Take 500 mg by mouth 3 (three) times daily.      Marland Kitchen dexamethasone (DECADRON) 4 MG tablet Take 2 tablets (8 mg total) by mouth 2 (two) times daily with a meal. Take two times a day the day before Taxotere. Then take two times a day starting the day after chemo for 3 days.  30 tablet  4  . DPH-Lido-AlHydr-MgHydr-Simeth (FIRST-MOUTHWASH BLM) SUSP Take 5 mLs by mouth 4 times daily.      Marland Kitchen etodolac (LODINE) 400 MG tablet Take 1 tablet by mouth Twice daily as needed.      . fluconazole (DIFLUCAN) 200 MG tablet Take 1 tablet (200 mg total) by mouth daily.  10 tablet  6  . lidocaine-prilocaine (EMLA) cream Apply topically as needed.  30 g  7  . LORazepam (ATIVAN) 0.5 MG tablet Take 1 tablet (0.5 mg total) by mouth every 6 (six) hours as needed (Nausea or vomiting).  30 tablet  0  . ondansetron (ZOFRAN) 8 MG tablet Take 1 tablet (8 mg total) by mouth 2 (two) times daily. Take two times a day starting the day after chemo for 3 days. Then take two times a day as needed for nausea or vomiting.  30 tablet  1  . prochlorperazine (COMPAZINE) 10 MG tablet Take 1 tablet (10 mg total) by mouth every 6 (six) hours as needed (Nausea or vomiting).  30 tablet  1  .  prochlorperazine (COMPAZINE) 25 MG suppository Place 1 suppository (25 mg total) rectally every 12 (twelve) hours as needed for nausea.  12 suppository  3  . traMADol (ULTRAM) 50 MG tablet 1 to 2 every 4 hours as needed      . valACYclovir (VALTREX) 500 MG tablet Take 1 daily  30 tablet  3    SURGICAL HISTORY:  Past Surgical History  Procedure Date  . 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 Cesarean section 1995/1996  . Lobectomy 1997    upper left  . Portacath placement 07/02/2012    Procedure: INSERTION PORT-A-CATH;  Surgeon: Mariella Saa, MD;  Location: [facility];  Service: General;  Laterality: N/A;   right    REVIEW OF SYSTEMS:  General: fatigue (-), night sweats (-), fever (-), pain (-) Lymph: palpable nodes (-) HEENT: vision changes (-), mucositis (-), gum bleeding (-), epistaxis (-) Cardiovascular: chest pain (-), palpitations (-) Pulmonary: shortness of breath (-), dyspnea on exertion (-), cough (-), hemoptysis (-) GI:  Early satiety (-), melena (-), dysphagia (-), nausea/vomiting (-), diarrhea (-) GU: dysuria (-), hematuria (-), incontinence (-) Musculoskeletal: joint swelling (-), joint pain (-), back pain (-) Neuro: weakness (-), numbness (-), headache (-), confusion (-) Skin: Rash (-), lesions (-), dryness (-) Psych: depression (-), suicidal/homicidal ideation (-), feeling of hopelessness (-)   PHYSICAL EXAMINATION: Blood pressure 105/72, pulse 137, temperature 98.2 F (36.8 C), temperature source Oral, resp. rate 20, height 5\' 2"  (1.575 m), weight 116 lb 12.8 oz (52.98 kg). Body mass index is 21.36 kg/(m^2). General: Patient is a well appearing female in no acute distress HEENT: PERRLA, sclerae anicteric no conjunctival pallor, MMM,  Neck: supple, no palpable adenopathy Lungs: clear to auscultation bilaterally, no wheezes, rhonchi, or rales Cardiovascular: regular rate rhythm, S1, S2, no murmurs, rubs or gallops HR rechecked and 62 Abdomen: Soft, non-tender, non-distended, normoactive bowel sounds, no HSM Extremities: warm and well perfused, no clubbing, cyanosis, or edema Skin: No rashes or lesions Neuro: Non-focal Left breast examination reveals a palpable mass measuring about 3 cm in the upper outer quadrant no nipple discharge no inversion or retraction . Right breast no masses or nipple discharge ECOG PERFORMANCE STATUS: 0 - Asymptomatic  LABORATORY DATA: Lab Results  Component Value Date   WBC 18.5* 07/19/2012   HGB 13.0 07/19/2012   HCT 37.3 07/19/2012   MCV 91.2 07/19/2012   PLT 217 07/19/2012      Chemistry      Component Value Date/Time   NA 136 07/19/2012 0848    NA 141 01/26/2011 1039   K 3.7 07/19/2012 0848   K 4.5 01/26/2011 1039   CL 99 07/19/2012 0848   CL 106 01/26/2011 1039   CO2 26 07/19/2012 0848   CO2 28 01/26/2011 1039   BUN 10.0 07/19/2012 0848   BUN 7 01/26/2011 1039   CREATININE 0.8 07/19/2012 0848   CREATININE 0.6 01/26/2011 1039      Component Value Date/Time   CALCIUM 9.4 07/19/2012 0848   CALCIUM 9.2 01/26/2011 1039   ALKPHOS 93 07/19/2012 0848   AST 19 07/19/2012 0848   ALT 20 07/19/2012 0848   BILITOT 0.29 07/19/2012 0848       RADIOGRAPHIC STUDIES:  US Breast Left  06/05/2012  *RADIOLOGY REPORT*  Clinical Data:  The patient feels a mass in the upper portion of the left breast.   She had a benign excisional biopsy of the right breast  in 2006.  She had a left upper lobectomy due to coccidioidomycosis in 1998.  DIGITAL DIAGNOSTIC BILATERAL MAMMOGRAM WITH CAD AND LEFT BREAST ULTRASOUND:  Comparison:  None.  Findings:  The breast tissue is heterogeneously dense.  There is an irregular mass with suspicious microcalcifications in the upper portion of the left breast.  No abnormality is noted on the right. Mammographic images were processed with CAD.  On physical exam, I palpate a 3 cm firm mass at 12 o'clock 4 cm from the left nipple.  Ultrasound is performed, showing an irregular hypoechoic mass with microcalcifications at 12 o'clock 4 cm from the left nipple measuring 2.5 x 2.6 x 1.5 cm.  Sonography of the left axilla demonstrates a prominent but not definitely abnormal axillary lymph node.  The appearance is highly suspicious for invasive mammary carcinoma. Biopsy is recommended.  Ultrasound-guided core needle biopsy was discussed with the patient and she agreed with this plan.  IMPRESSION: Highly suspicious mass at 12 o'clock 4 cm from the left nipple measuring 2.5 x 2.6 x 1.5 cm.  RECOMMENDATION: Ultrasound-guided core needle biopsy is recommended.  This will be performed and reported separately.  I have discussed the findings and recommendations with  the patient. Results were also provided in writing at the conclusion of the visit.  BI-RADS CATEGORY 5:  Highly suggestive of malignancy - appropriate action should be taken.   Original Report Authenticated By: Cain Saupe, M.D.    Mr Breast Bilateral W Wo Contrast  06/12/2012  Normal *RADIOLOGY REPORT*  Clinical Data: Recently diagnosed left breast invasive mammary carcinoma.  Preoperative evaluation.  BILATERAL BREAST MRI WITH AND WITHOUT CONTRAST  Technique: Multiplanar, multisequence MR images of both breasts were obtained prior to and following the intravenous administration of 9ml of Multihance.  Three dimensional images were evaluated at the independent DynaCad workstation.  Comparison:  06/05/2012 mammogram and ultrasound.  Findings: There is marked diffuse parenchymal background enhancement.  There is an irregular enhancing mass located within the middle one third of the left breast at the 12 o'clock position with central clip artifact corresponding to the recently diagnosed left breast invasive mammary carcinoma.  The mass measures 3.1 x 2.4 x 1.7 cm in size and is associated with a mixture of plateau and washout enhancement kinetics.  There are no additional worrisome enhancing foci within either breast.  There is no evidence for axillary or internal mammary adenopathy and there are no additional findings.  IMPRESSION: 3.1 cm irregular enhancing mass located within the left breast at 12 o'clock position corresponding to the recently diagnosed left breast invasive mammary carcinoma.  No evidence for adenopathy and no additional findings.  RECOMMENDATION:  Treatment plan  THREE-DIMENSIONAL MR IMAGE RENDERING ON INDEPENDENT WORKSTATION:  Three-dimensional MR images were rendered by post-processing of the original MR data on an independent workstation.  The three- dimensional MR images were interpreted, and findings were reported in the accompanying complete MRI report for this study.  BI-RADS CATEGORY  6:  Known biopsy-proven malignancy - appropriate action should be taken.   Original Report Authenticated By: Rolla Plate, M.D.    [name] Core Biopsy  06/05/2012  *RADIOLOGY REPORT*  Clinical Data:  Palpable mass at 12 o'clock 4 cm from the left nipple.  ULTRASOUND GUIDED VACUUM ASSISTED CORE BIOPSY OF THE LEFT BREAST  The patient and I discussed the procedure of ultrasound-guided biopsy, including benefits and alternatives.  We discussed the high likelihood of a successful procedure. We discussed the risks of the procedure including infection,  bleeding, tissue injury, clip migration, and inadequate sampling.  Informed written consent was given.  Using sterile technique, 2% lidocaine, 2% lidocaine with epinephrine, ultrasound guidance, and a 12 gauge vacuum assisted needle, biopsy was performed of the mass at 12 o'clock 4 cm from the left nipple using a lateromedial approach.  At the conclusion of the procedure, a ribbon tissue marker clip was deployed into the biopsy cavity.  Follow-up 2-view mammogram was performed and dictated separately.  IMPRESSION: Ultrasound-guided biopsy of the mass at 12 o'clock 4 cm from the left nipple.  No apparent complications.   Original Report Authenticated By: Cain Saupe, M.D.    Mm Digital Diagnostic Bilat  06/05/2012  *RADIOLOGY REPORT*  Clinical Data:  The patient feels a mass in the upper portion of the left breast.   She had a benign excisional biopsy of the right breast in 2006.  She had a left upper lobectomy due to coccidioidomycosis in 1998.  DIGITAL DIAGNOSTIC BILATERAL MAMMOGRAM WITH CAD AND LEFT BREAST ULTRASOUND:  Comparison:  None.  Findings:  The breast tissue is heterogeneously dense.  There is an irregular mass with suspicious microcalcifications in the upper portion of the left breast.  No abnormality is noted on the right. Mammographic images were processed with CAD.  On physical exam, I palpate a 3 cm firm mass at 12 o'clock 4 cm from the left  nipple.  Ultrasound is performed, showing an irregular hypoechoic mass with microcalcifications at 12 o'clock 4 cm from the left nipple measuring 2.5 x 2.6 x 1.5 cm.  Sonography of the left axilla demonstrates a prominent but not definitely abnormal axillary lymph node.  The appearance is highly suspicious for invasive mammary carcinoma. Biopsy is recommended.  Ultrasound-guided core needle biopsy was discussed with the patient and she agreed with this plan.  IMPRESSION: Highly suspicious mass at 12 o'clock 4 cm from the left nipple measuring 2.5 x 2.6 x 1.5 cm.  RECOMMENDATION: Ultrasound-guided core needle biopsy is recommended.  This will be performed and reported separately.  I have discussed the findings and recommendations with the patient. Results were also provided in writing at the conclusion of the visit.  BI-RADS CATEGORY 5:  Highly suggestive of malignancy - appropriate action should be taken.   Original Report Authenticated By: Cain Saupe, M.D.    Mm Digital Diagnostic Unilat L  06/05/2012  *RADIOLOGY REPORT*  Clinical Data:  Ultrasound-guided core needle biopsy of a mass at 12 o'clock 4 cm from the left nipple with clip placement.  DIGITAL DIAGNOSTIC LEFT MAMMOGRAM  Comparison:  None.  Findings:  Films are performed following ultrasound guided biopsy of a mass at 12 o'clock 4 cm from the left nipple.  The ribbon clip is appropriately positioned.  IMPRESSION: Appropriate clip placement following ultrasound-guided core needle biopsy of a mass at 12 o'clock 4 cm from the left nipple.   Original Report Authenticated By: Cain Saupe, M.D.    Mm Radiologist Eval And Mgmt  06/06/2012  *RADIOLOGY REPORT*  ESTABLISHED PATIENT OFFICE VISIT - LEVEL II [phone])  Chief Complaint:  The patient presented with a palpable left breast mass.  History:  Ultrasound guided core biopsy of the left breast mass was performed on 06/05/2012.  Exam:  The wound site is clean and dry with no signs of inflammation or  hematoma.  Results of the biopsy were discussed with the patient.  Pathology: Invasive ductal carcinoma was reported histologically. This corresponds well with the imaging findings.  Assessment and Plan:  The  patient will be seen at the Multidisciplinary Clinic on 06/13/2012.  Breast MRI has been scheduled.  Informational reading material was provided to the patient.   Original Report Authenticated By: Baird Lyons, M.D.     ASSESSMENT: 47 year old female with  #1 stage II a (3.1 cm) invasive ductal carcinoma of the left breast status post needle core biopsy that showed ER positive PR positive HER-2/neu positive breast cancer with a Ki-67 at 70%. Patient is now going to receive neoadjuvant chemotherapy and she desires breast conservation. She was originally seen in the multidisciplinary breast clinic.  #2 she will begin her chemotherapy on 07/13/2011 after she has her Port-A-Cath placed next week she has had her chemotherapy teaching class as well as echocardiogram. She has also been seen by the cardiology.  #3 Dehydration  #4 Thrush  PLAN:   #1 Ms. Rodger is doing well today, will proceed with Herceptin.    #2 Her thrush and dehydration are resolved.     #3 She will return next week for [facility].    All questions were answered. The patient knows to call the clinic with any problems, questions or concerns. We can certainly see the patient much sooner if necessary.  I spent 25 minutes counseling the patient face to face. The total time spent in the appointment was 30 minutes.    Cherie Ouch Lyn Hollingshead, NP Medical Oncology [facility] Phone: [phone]    07/26/2012, 10:10 AM

## 2012-07-26 NOTE — Patient Instructions (Signed)
Doing well.  Proceed with Herceptin.  Please call us if you have any questions or concerns.     We will see you back next week for your next treatment.

## 2012-07-26 NOTE — Patient Instructions (Addendum)
[facility] Discharge Instructions for Patients Receiving Chemotherapy  Today you received the following chemotherapy agents :  Herceptin.  To help prevent nausea and vomiting after your treatment, we encourage you to take your nausea medication as instructed by your physician.    If you develop nausea and vomiting that is not controlled by your nausea medication, call the clinic. If it is after clinic hours your family physician or the after hours number for the clinic or go to the Emergency Department.   BELOW ARE SYMPTOMS THAT SHOULD BE REPORTED IMMEDIATELY:  *FEVER GREATER THAN 100.5 F  *CHILLS WITH OR WITHOUT FEVER  NAUSEA AND VOMITING THAT IS NOT CONTROLLED WITH YOUR NAUSEA MEDICATION  *UNUSUAL SHORTNESS OF BREATH  *UNUSUAL BRUISING OR BLEEDING  TENDERNESS IN MOUTH AND THROAT WITH OR WITHOUT PRESENCE OF ULCERS  *URINARY PROBLEMS  *BOWEL PROBLEMS  UNUSUAL RASH Items with * indicate a potential emergency and should be followed up as soon as possible.  One of the nurses will contact you 24 hours after your treatment. Please let the nurse know about any problems that you may have experienced. Feel free to call the clinic you have any questions or concerns. The clinic phone number is [phone].   I have been informed and understand all the instructions given to me. I know to contact the clinic, my physician, or go to the Emergency Department if any problems should occur. I do not have any questions at this time, but understand that I may call the clinic during office hours   should I have any questions or need assistance in obtaining follow up care.    __________________________________________  _____________  __________ Signature of Patient or Authorized Representative            Date                   Time    __________________________________________ Nurse's Signature    

## 2012-07-30 ENCOUNTER — Ambulatory Visit (INDEPENDENT_AMBULATORY_CARE_PROVIDER_SITE_OTHER): Payer: BC Managed Care – PPO | Admitting: Internal Medicine

## 2012-07-30 ENCOUNTER — Encounter: Payer: Self-pay | Admitting: Internal Medicine

## 2012-07-30 ENCOUNTER — Ambulatory Visit (INDEPENDENT_AMBULATORY_CARE_PROVIDER_SITE_OTHER)
Admission: RE | Admit: 2012-07-30 | Discharge: 2012-07-30 | Disposition: A | Discharge status: Home / Self Care | Payer: BC Managed Care – PPO | Source: Ambulatory Visit | Attending: Internal Medicine | Admitting: Internal Medicine

## 2012-07-30 VITALS — BP 100/78 | HR 80 | Temp 97.5°F | Ht 62.5 in | Wt 122.0 lb

## 2012-07-30 DIAGNOSIS — F17200 Nicotine dependence, unspecified, uncomplicated: Secondary | ICD-10-CM

## 2012-07-30 DIAGNOSIS — J479 Bronchiectasis, uncomplicated: Secondary | ICD-10-CM

## 2012-07-30 MED ORDER — AMOXICILLIN 500 MG PO CAPS: 500.0000 mg | ORAL_CAPSULE | Freq: Three times a day (TID) | ORAL | Status: DC

## 2012-07-30 NOTE — Patient Instructions (Addendum)
Please remember to go to the x-ray department downstairs for your tests - we will call you with the results when they are available.  If nasty mucus, take amoxicillin x 10 days  If thick sputum > use mucinex or mucinex dm per bottle  If wheeze or can't  catch your breath > ventolin 2 puffs every hours if needed  The key is to stop smoking completely before smoking completely stops you!   Please schedule a follow up visit in 3 months but call sooner if needed with pft's

## 2012-07-30 NOTE — Progress Notes (Signed)
Subjective:     Patient ID: Melissa [name], female   DOB: 1965-09-16, 47 y.o.   MRN: [id number]  HPI  37 yowf smoker dx'd with valley fever in [place] requiring LULobectomy in 1997 and 3 years of antifungal rx with fluconazole (for 2 years p latter) with residual sob heavy doe and sev times a year pain in L shoulder and lower back up to couple times a year last to a couples of weeks maybe better with abx, maybe some worse joint pains,  Sometimes some sweats, occ with nasty mucus but no blood referred to pulmonary clinic for recurrent cp 01/2011   01/26/2011 Initial pulmonary office eval cc recurrent pleuritic L ant  pain, more severe than usual episodes,  rad neck,  Onset x 2 weeks with abuptly,  no better on lodine> stopped.   No sweats or nasty mucus now. Some better at day of ov p rx with  rx dex and zithromax but  some sorethroat since dex with overt hb.rec ct chest POS BRONCHIECTASIS    02/10/2011 ov/Melissa [name] cc cp better, only used 3 tramadol since last ov. No sob. rec Bronchiectasis =   you have scarring of your bronchial tubes which means that they don't function perfectly normally and mucus tends to pool in certain areas of your lung which can cause pneumonia and further scarring of your lung and bronchial tubes  Whenever you develop cough congestion take mucinex or mucinex dm > these will help keep the mucus loose and flowing but if your condition worsens you need to seek help immediately preferably here or somewhere inside the [facility] system to compare xrays ( worse = darker or bloody mucus or pain on breathing in)   Amoxicillin 500mg  3 x daily x 10 days is a reasonable way to treat flares (increased pain, change in mucus) with less tendency to cause side effects than many choices.  Stopping smoking is critical  Please schedule a follow up visit in 3 months but call sooner if needed  - needs f/u cxr      05/03/2011  f/u ov/Melissa [name] cc cough imporved but worst first thing in am, took one course of  amoxicillin, rare albuterol use. 2 flights doe = baseline >>no changes   03/23/2012 Acute OV  Complains of wheezing, chest congestion, occ prod cough with small amounts of clear/yellow mucus, occ tightness in chest, increased DOE x2 weeks.  Still smoking . Discussed smoking cessatation  Ran out of Amoxicillin this month. Has been taking Amoxicillin first 10 days of each month .  No fever or hemoptysis or edema.  Augmentin 875mg  Twice daily  For 10 days  Mucinex DM Twice daily  As needed  .  Fluids and rest  Beginning,  October  restart Amoxicillin 500mg  Three times a day  For the first 10 days of each month    07/30/2012 f/u ov/Melissa [name] cc overall better, no purulent sputum or limited activity due to breathing. Minimal daytime saba use.  No obvious daytime variabilty or assoc  cp or chest tightness, subjective wheeze overt sinus or hb symptoms. No unusual exp hx    Sleeping ok without nocturnal  or early am exacerbation  of respiratory  c/o's or need for noct saba. Also denies any obvious fluctuation of symptoms with weather or environmental changes or other aggravating or alleviating factors except as outlined above   ROS  The following are not active complaints unless bolded sore throat, dysphagia, dental problems, itching, sneezing,  nasal congestion or  excess/ purulent secretions, ear ache,   fever, chills, sweats, unintended wt loss, pleuritic or exertional cp, hemoptysis,  orthopnea pnd or leg swelling, presyncope, palpitations, heartburn, abdominal pain, anorexia, nausea, vomiting, diarrhea  or change in bowel or urinary habits, change in stools or urine, dysuria,hematuria,  rash, arthralgias, visual complaints, headache, numbness weakness or ataxia or problems with walking or coordination,  change in mood/affect or memory.                Objective:   Physical Exam Mod anxious but pleasant amb wf nad  Wt 118  02/10/11 >  05/03/2011  118 > 117 03/23/2012 >  122 07/30/2012   HEENT:  nl dentition, turbinates, and orophanx. Nl external ear canals without cough reflex   NECK :  without JVD/Nodes/TM/ nl carotid upstrokes bilaterally   LUNGS: no acc muscle use, coarse BS    CV:  RRR  no s3 or murmur or increase in P2, no edema   ABD:  soft and nontender with nl excursion in the supine position. No bruits or organomegaly, bowel sounds nl  MS:  warm without deformities, calf tenderness, cyanosis or clubbing    cxr 07/30/12 Wall thickening and retraction of the area of cystic bronchiectasis in the left lower lobe. No other change since the prior exam.      Assessment:         Plan:

## 2012-07-31 NOTE — Assessment & Plan Note (Signed)
-   CT chest   01/26/2011 Status post left upper lobectomy.  Focal bronchiectasis / scarring medially in the left lung apex with  surrounding inflammatory changes  - Pneumovax 02/10/2011   Adequate control on present rx, reviewed   I had an extended discussion with the patient today lasting 15 to 20 minutes of a 25 minute visit on the following issues:    Each maintenance medication was reviewed in detail including most importantly the difference between maintenance and as needed and under what circumstances the prns are to be used.  Please see instructions for details which were reviewed in writing and the patient given a copy.

## 2012-07-31 NOTE — Assessment & Plan Note (Signed)

## 2012-07-31 NOTE — Progress Notes (Signed)
Quick Note:  Spoke with pt and notified of results per Dr. Wert. Pt verbalized understanding and denied any questions.  ______ 

## 2012-08-02 ENCOUNTER — Ambulatory Visit: Payer: BC Managed Care – PPO | Admitting: Genetic Counselor

## 2012-08-02 ENCOUNTER — Ambulatory Visit (HOSPITAL_BASED_OUTPATIENT_CLINIC_OR_DEPARTMENT_OTHER): Payer: BC Managed Care – PPO

## 2012-08-02 ENCOUNTER — Encounter: Payer: Self-pay | Admitting: Adult Health

## 2012-08-02 ENCOUNTER — Other Ambulatory Visit: Payer: BC Managed Care – PPO | Admitting: Lab

## 2012-08-02 ENCOUNTER — Other Ambulatory Visit (HOSPITAL_BASED_OUTPATIENT_CLINIC_OR_DEPARTMENT_OTHER): Payer: BC Managed Care – PPO | Admitting: Lab

## 2012-08-02 ENCOUNTER — Ambulatory Visit (HOSPITAL_BASED_OUTPATIENT_CLINIC_OR_DEPARTMENT_OTHER): Payer: BC Managed Care – PPO | Admitting: Adult Health

## 2012-08-02 VITALS — BP 118/71 | HR 81 | Temp 98.3°F | Resp 20 | Ht 62.5 in | Wt 121.5 lb

## 2012-08-02 DIAGNOSIS — R51 Headache: Secondary | ICD-10-CM

## 2012-08-02 DIAGNOSIS — C50219 Malignant neoplasm of upper-inner quadrant of unspecified female breast: Secondary | ICD-10-CM

## 2012-08-02 DIAGNOSIS — C50919 Malignant neoplasm of unspecified site of unspecified female breast: Secondary | ICD-10-CM

## 2012-08-02 DIAGNOSIS — Z5111 Encounter for antineoplastic chemotherapy: Secondary | ICD-10-CM

## 2012-08-02 DIAGNOSIS — Z170 Estrogen receptor positive status [ER+]: Secondary | ICD-10-CM

## 2012-08-02 DIAGNOSIS — Z5112 Encounter for antineoplastic immunotherapy: Secondary | ICD-10-CM

## 2012-08-02 LAB — CBC WITH DIFFERENTIAL/PLATELET
BASO%: 0.1 % (ref 0.0–2.0)
Basophils Absolute: 0 10*3/uL (ref 0.0–0.1)
HCT: 33 % — ABNORMAL LOW (ref 34.8–46.6)
HGB: 11.3 g/dL — ABNORMAL LOW (ref 11.6–15.9)
MONO#: 0.1 10*3/uL (ref 0.1–0.9)
NEUT%: 92.6 % — ABNORMAL HIGH (ref 38.4–76.8)
WBC: 10.5 10*3/uL — ABNORMAL HIGH (ref 3.9–10.3)
lymph#: 0.7 10*3/uL — ABNORMAL LOW (ref 0.9–3.3)

## 2012-08-02 LAB — COMPREHENSIVE METABOLIC PANEL (CC13)
AST: 16 U/L (ref 5–34)
Albumin: 3.1 g/dL — ABNORMAL LOW (ref 3.5–5.0)
BUN: 8 mg/dL (ref 7.0–26.0)
Calcium: 10 mg/dL (ref 8.4–10.4)
Chloride: 106 mEq/L (ref 98–107)
Glucose: 151 mg/dl — ABNORMAL HIGH (ref 70–99)
Potassium: 4.2 mEq/L (ref 3.5–5.1)
Total Protein: 7.2 g/dL (ref 6.4–8.3)

## 2012-08-02 MED ORDER — SODIUM CHLORIDE 0.9 % IJ SOLN
10.0000 mL | INTRAMUSCULAR | Status: DC | PRN
  Administered 2012-08-02: 10 mL
  Filled 2012-08-02: qty 10

## 2012-08-02 MED ORDER — SODIUM CHLORIDE 0.9 % IV SOLN
Freq: Once | INTRAVENOUS | Status: AC
  Administered 2012-08-02: 10:00:00 via INTRAVENOUS

## 2012-08-02 MED ORDER — DIPHENHYDRAMINE HCL 25 MG PO CAPS
50.0000 mg | ORAL_CAPSULE | Freq: Once | ORAL | Status: AC
  Administered 2012-08-02: 50 mg via ORAL

## 2012-08-02 MED ORDER — ONDANSETRON 16 MG/50ML IVPB (CHCC)
16.0000 mg | Freq: Once | INTRAVENOUS | Status: AC
  Administered 2012-08-02: 16 mg via INTRAVENOUS

## 2012-08-02 MED ORDER — TRASTUZUMAB CHEMO INJECTION 440 MG
2.0000 mg/kg | Freq: Once | INTRAVENOUS | Status: AC
  Administered 2012-08-02: 105 mg via INTRAVENOUS
  Filled 2012-08-02: qty 5

## 2012-08-02 MED ORDER — HEPARIN SOD (PORK) LOCK FLUSH 100 UNIT/ML IV SOLN
500.0000 [IU] | Freq: Once | INTRAVENOUS | Status: AC | PRN
  Administered 2012-08-02: 500 [IU]
  Filled 2012-08-02: qty 5

## 2012-08-02 MED ORDER — SODIUM CHLORIDE 0.9 % IV SOLN
603.0000 mg | Freq: Once | INTRAVENOUS | Status: AC
  Administered 2012-08-02: 600 mg via INTRAVENOUS
  Filled 2012-08-02: qty 60

## 2012-08-02 MED ORDER — ACETAMINOPHEN 325 MG PO TABS
650.0000 mg | ORAL_TABLET | Freq: Once | ORAL | Status: AC
  Administered 2012-08-02: 650 mg via ORAL

## 2012-08-02 MED ORDER — DEXAMETHASONE SODIUM PHOSPHATE 4 MG/ML IJ SOLN
20.0000 mg | Freq: Once | INTRAMUSCULAR | Status: AC
  Administered 2012-08-02: 20 mg via INTRAVENOUS

## 2012-08-02 MED ORDER — DEXTROSE 5 % IV SOLN
75.0000 mg/m2 | Freq: Once | INTRAVENOUS | Status: AC
  Administered 2012-08-02: 110 mg via INTRAVENOUS
  Filled 2012-08-02: qty 11

## 2012-08-02 NOTE — Patient Instructions (Addendum)
[facility] Discharge Instructions for Patients Receiving Chemotherapy  Today you received the following chemotherapy agents Taxotere, Carboplatin and Herceptin.  To help prevent nausea and vomiting after your treatment, we encourage you to take your nausea medication as directed. We advise you not to drive for the next few hours due to possible sedation from your pre-meds;Benadryl.   If you develop nausea and vomiting that is not controlled by your nausea medication, call the clinic. If it is after clinic hours your family physician or the after hours number for the clinic or go to the Emergency Department.   BELOW ARE SYMPTOMS THAT SHOULD BE REPORTED IMMEDIATELY:  *FEVER GREATER THAN 100.5 F  *CHILLS WITH OR WITHOUT FEVER  NAUSEA AND VOMITING THAT IS NOT CONTROLLED WITH YOUR NAUSEA MEDICATION  *UNUSUAL SHORTNESS OF BREATH  *UNUSUAL BRUISING OR BLEEDING  TENDERNESS IN MOUTH AND THROAT WITH OR WITHOUT PRESENCE OF ULCERS  *URINARY PROBLEMS  *BOWEL PROBLEMS  UNUSUAL RASH Items with * indicate a potential emergency and should be followed up as soon as possible.  Feel free to call the clinic you have any questions or concerns. The clinic phone number is [phone].

## 2012-08-02 NOTE — Patient Instructions (Signed)
Doing well.  Proceed with chemotherapy.  We will see you next week.  Please call us if you have any questions or concerns.

## 2012-08-02 NOTE — Progress Notes (Signed)
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  PRIOR THERAPY:  #1 patient was originally seen in the multidisciplinary breast clinic when she found a left breast mass she eventually had MRI and mammograms performed. She was diagnosed with invasive ductal carcinoma that was ER positive PR positive HER-2/neu positive measuring 3.1 cm by MRI criteria. Ki-67 was 70% HER-2 was amplified with a ratio 2.91.  #2 patient was seen at the multidisciplinary breast clinic and a recommendation for neoadjuvant chemotherapy was recommended as patient was interested in breast conservation. She will receive neoadjuvant Taxotere carboplatinum Herceptin. Taxotere carboplatinum will be given every 21 days with weekly Herceptin. Total of 6 cycles of Taxotere and carboplatinum will be administered.  CURRENT THERAPY: Cycle 2 Day 1 TCH with Neulasta support, weekly Herceptin  INTERVAL HISTORY: Melissa [name] 47 y.o. female returns for evaluation for her next cycle of TCH.  She's doing well.  She has had an occasional headache the past few days which have been mild in nature, relieved with tylenol, and with no specific pattern.  Her appetite and fluid intake have improved.  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  She denies numbness, tingling, fevers, chills, and a 10 point ros is neg.   MEDICAL HISTORY: Past Medical History  Diagnosis Date  . Breast cancer   . Headache   . Back pain   . 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 [place] fever     ALLERGIES:  is allergic to aspirin and iodinated diagnostic  agents.  MEDICATIONS:  Current Outpatient Prescriptions  Medication Sig Dispense Refill  . albuterol (VENTOLIN HFA) 108 (90 BASE) MCG/ACT inhaler Inhale 2 puffs into the lungs every 6 (six) hours as needed.        Marland Kitchen amoxicillin (AMOXIL) 500 MG capsule Take 1 capsule (500 mg total) by mouth 3 (three) times daily.  30 capsule  11  . lidocaine-prilocaine (EMLA) cream Apply topically as needed.  30 g  7  . LORazepam (ATIVAN) 0.5 MG tablet Take 1 tablet (0.5 mg total) by mouth every 6 (six) hours as needed (Nausea or vomiting).  30 tablet  0  . ondansetron (ZOFRAN) 8 MG tablet Take 1 tablet (8 mg total) by mouth 2 (two) times daily. Take two times a day starting the day after chemo for 3 days. Then take two times a day as needed for nausea or vomiting.  30 tablet  1  . prochlorperazine (COMPAZINE) 10 MG tablet Take 1 tablet (10 mg total) by mouth every 6 (six) hours as needed (Nausea or vomiting).  30 tablet  1  . Alum & Mag Hydroxide-Simeth (MAGIC MOUTHWASH) SOLN Take 15 mLs by mouth 3 (three) times daily as needed.  240 mL  3  . dexamethasone (DECADRON) 4 MG tablet Take 2 tablets (8 mg total) by mouth 2 (two) times daily with a meal. Take two times a day the day before Taxotere. Then take two times a day starting the day after chemo for 3 days.  30 tablet  4  . fluconazole (DIFLUCAN) 200 MG tablet       .  naproxen sodium (ANAPROX) 220 MG tablet Take 220 mg by mouth 2 (two) times daily with a meal.      . valACYclovir (VALTREX) 500 MG tablet         SURGICAL HISTORY:  Past Surgical History  Procedure Date  . 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 Cesarean section 1995/1996  . Lobectomy 1997    upper left  . Portacath placement 07/02/2012    Procedure: INSERTION PORT-A-CATH;  Surgeon: Mariella Saa, MD;  Location: [facility];  Service: General;  Laterality: N/A;  right    REVIEW OF SYSTEMS:  General: fatigue (-), night sweats (-), fever (-), pain  (-) Lymph: palpable nodes (-) HEENT: vision changes (-), mucositis (-), gum bleeding (-), epistaxis (-) Cardiovascular: chest pain (-), palpitations (-) Pulmonary: shortness of breath (-), dyspnea on exertion (-), cough (-), hemoptysis (-) GI:  Early satiety (-), melena (-), dysphagia (-), nausea/vomiting (-), diarrhea (-) GU: dysuria (-), hematuria (-), incontinence (-) Musculoskeletal: joint swelling (-), joint pain (-), back pain (-) Neuro: weakness (-), numbness (-), headache (+), confusion (-) Skin: Rash (-), lesions (-), dryness (-) Psych: depression (-), suicidal/homicidal ideation (-), feeling of hopelessness (-)   PHYSICAL EXAMINATION: Blood pressure 118/71, pulse 81, temperature 98.3 F (36.8 C), temperature source Oral, resp. rate 20, height 5' 2.5" (1.588 m), weight 121 lb 8 oz (55.112 kg). Body mass index is 21.87 kg/(m^2). General: Patient is a well appearing female in no acute distress HEENT: PERRLA, sclerae anicteric no conjunctival pallor, MMM,  Neck: supple, no palpable adenopathy Lungs: clear to auscultation bilaterally, no wheezes, rhonchi, or rales Cardiovascular: regular rate rhythm, S1, S2, no murmurs, rubs or gallops  Abdomen: Soft, non-tender, non-distended, normoactive bowel sounds, no HSM Extremities: warm and well perfused, no clubbing, cyanosis, or edema Skin: No rashes or lesions Neuro: Non-focal Left breast examination reveals a palpable mass measuring about 3 cm in the upper outer quadrant no nipple discharge no inversion or retraction . Right breast no masses or nipple discharge ECOG PERFORMANCE STATUS: 0 - Asymptomatic  LABORATORY DATA: Lab Results  Component Value Date   WBC 10.5* 08/02/2012   HGB 11.3* 08/02/2012   HCT 33.0* 08/02/2012   MCV 94.0 08/02/2012   PLT 242 08/02/2012      Chemistry      Component Value Date/Time   NA 136 07/19/2012 0848   NA 141 01/26/2011 1039   K 3.7 07/19/2012 0848   K 4.5 01/26/2011 1039   CL 99 07/19/2012 0848   CL  106 01/26/2011 1039   CO2 26 07/19/2012 0848   CO2 28 01/26/2011 1039   BUN 10.0 07/19/2012 0848   BUN 7 01/26/2011 1039   CREATININE 0.8 07/19/2012 0848   CREATININE 0.6 01/26/2011 1039      Component Value Date/Time   CALCIUM 9.4 07/19/2012 0848   CALCIUM 9.2 01/26/2011 1039   ALKPHOS 93 07/19/2012 0848   AST 19 07/19/2012 0848   ALT 20 07/19/2012 0848   BILITOT 0.29 07/19/2012 0848       RADIOGRAPHIC STUDIES:  US Breast Left  06/05/2012  *RADIOLOGY REPORT*  Clinical Data:  The patient feels a mass in the upper portion of the left breast.   She had a benign excisional biopsy of the right breast in 2006.  She had a left upper lobectomy due to coccidioidomycosis in 1998.  DIGITAL DIAGNOSTIC BILATERAL MAMMOGRAM WITH CAD AND LEFT BREAST ULTRASOUND:  Comparison:  None.  Findings:  The breast tissue is heterogeneously dense.  There is an irregular mass with suspicious microcalcifications in the upper portion of the left breast.  No abnormality is noted on the right. Mammographic images were processed with CAD.  On physical exam, I palpate a 3 cm firm mass at 12 o'clock 4 cm from the left nipple.  Ultrasound is performed, showing an irregular hypoechoic mass with microcalcifications at 12 o'clock 4 cm from the left nipple measuring 2.5 x 2.6 x 1.5 cm.  Sonography of the left axilla demonstrates a prominent but not definitely abnormal axillary lymph node.  The appearance is highly suspicious for invasive mammary carcinoma. Biopsy is recommended.  Ultrasound-guided core needle biopsy was discussed with the patient and she agreed with this plan.  IMPRESSION: Highly suspicious mass at 12 o'clock 4 cm from the left nipple measuring 2.5 x 2.6 x 1.5 cm.  RECOMMENDATION: Ultrasound-guided core needle biopsy is recommended.  This will be performed and reported separately.  I have discussed the findings and recommendations with the patient. Results were also provided in writing at the conclusion of the visit.  BI-RADS CATEGORY  5:  Highly suggestive of malignancy - appropriate action should be taken.   Original Report Authenticated By: Cain Saupe, M.D.    Mr Breast Bilateral W Wo Contrast  06/12/2012  Normal *RADIOLOGY REPORT*  Clinical Data: Recently diagnosed left breast invasive mammary carcinoma.  Preoperative evaluation.  BILATERAL BREAST MRI WITH AND WITHOUT CONTRAST  Technique: Multiplanar, multisequence MR images of both breasts were obtained prior to and following the intravenous administration of 9ml of Multihance.  Three dimensional images were evaluated at the independent DynaCad workstation.  Comparison:  06/05/2012 mammogram and ultrasound.  Findings: There is marked diffuse parenchymal background enhancement.  There is an irregular enhancing mass located within the middle one third of the left breast at the 12 o'clock position with central clip artifact corresponding to the recently diagnosed left breast invasive mammary carcinoma.  The mass measures 3.1 x 2.4 x 1.7 cm in size and is associated with a mixture of plateau and washout enhancement kinetics.  There are no additional worrisome enhancing foci within either breast.  There is no evidence for axillary or internal mammary adenopathy and there are no additional findings.  IMPRESSION: 3.1 cm irregular enhancing mass located within the left breast at 12 o'clock position corresponding to the recently diagnosed left breast invasive mammary carcinoma.  No evidence for adenopathy and no additional findings.  RECOMMENDATION:  Treatment plan  THREE-DIMENSIONAL MR IMAGE RENDERING ON INDEPENDENT WORKSTATION:  Three-dimensional MR images were rendered by post-processing of the original MR data on an independent workstation.  The three- dimensional MR images were interpreted, and findings were reported in the accompanying complete MRI report for this study.  BI-RADS CATEGORY 6:  Known biopsy-proven malignancy - appropriate action should be taken.   Original Report  Authenticated By: Rolla Plate, M.D.    [name] Core Biopsy  06/05/2012  *RADIOLOGY REPORT*  Clinical Data:  Palpable mass at 12 o'clock 4 cm from the left nipple.  ULTRASOUND GUIDED VACUUM ASSISTED CORE BIOPSY OF THE LEFT BREAST  The patient and I discussed the procedure of ultrasound-guided biopsy, including benefits and alternatives.  We discussed the high likelihood of a successful procedure. We discussed the risks of the procedure including infection, bleeding, tissue injury, clip migration, and inadequate sampling.  Informed written consent was given.  Using sterile technique, 2% lidocaine, 2% lidocaine with epinephrine, ultrasound guidance, and a  12 gauge vacuum assisted needle, biopsy was performed of the mass at 12 o'clock 4 cm from the left nipple using a lateromedial approach.  At the conclusion of the procedure, a ribbon tissue marker clip was deployed into the biopsy cavity.  Follow-up 2-view mammogram was performed and dictated separately.  IMPRESSION: Ultrasound-guided biopsy of the mass at 12 o'clock 4 cm from the left nipple.  No apparent complications.   Original Report Authenticated By: Cain Saupe, M.D.    Mm Digital Diagnostic Bilat  06/05/2012  *RADIOLOGY REPORT*  Clinical Data:  The patient feels a mass in the upper portion of the left breast.   She had a benign excisional biopsy of the right breast in 2006.  She had a left upper lobectomy due to coccidioidomycosis in 1998.  DIGITAL DIAGNOSTIC BILATERAL MAMMOGRAM WITH CAD AND LEFT BREAST ULTRASOUND:  Comparison:  None.  Findings:  The breast tissue is heterogeneously dense.  There is an irregular mass with suspicious microcalcifications in the upper portion of the left breast.  No abnormality is noted on the right. Mammographic images were processed with CAD.  On physical exam, I palpate a 3 cm firm mass at 12 o'clock 4 cm from the left nipple.  Ultrasound is performed, showing an irregular hypoechoic mass with microcalcifications  at 12 o'clock 4 cm from the left nipple measuring 2.5 x 2.6 x 1.5 cm.  Sonography of the left axilla demonstrates a prominent but not definitely abnormal axillary lymph node.  The appearance is highly suspicious for invasive mammary carcinoma. Biopsy is recommended.  Ultrasound-guided core needle biopsy was discussed with the patient and she agreed with this plan.  IMPRESSION: Highly suspicious mass at 12 o'clock 4 cm from the left nipple measuring 2.5 x 2.6 x 1.5 cm.  RECOMMENDATION: Ultrasound-guided core needle biopsy is recommended.  This will be performed and reported separately.  I have discussed the findings and recommendations with the patient. Results were also provided in writing at the conclusion of the visit.  BI-RADS CATEGORY 5:  Highly suggestive of malignancy - appropriate action should be taken.   Original Report Authenticated By: Cain Saupe, M.D.    Mm Digital Diagnostic Unilat L  06/05/2012  *RADIOLOGY REPORT*  Clinical Data:  Ultrasound-guided core needle biopsy of a mass at 12 o'clock 4 cm from the left nipple with clip placement.  DIGITAL DIAGNOSTIC LEFT MAMMOGRAM  Comparison:  None.  Findings:  Films are performed following ultrasound guided biopsy of a mass at 12 o'clock 4 cm from the left nipple.  The ribbon clip is appropriately positioned.  IMPRESSION: Appropriate clip placement following ultrasound-guided core needle biopsy of a mass at 12 o'clock 4 cm from the left nipple.   Original Report Authenticated By: Cain Saupe, M.D.    Mm Radiologist Eval And Mgmt  06/06/2012  *RADIOLOGY REPORT*  ESTABLISHED PATIENT OFFICE VISIT - LEVEL II [phone])  Chief Complaint:  The patient presented with a palpable left breast mass.  History:  Ultrasound guided core biopsy of the left breast mass was performed on 06/05/2012.  Exam:  The wound site is clean and dry with no signs of inflammation or hematoma.  Results of the biopsy were discussed with the patient.  Pathology: Invasive ductal  carcinoma was reported histologically. This corresponds well with the imaging findings.  Assessment and Plan:  The patient will be seen at the Multidisciplinary Clinic on 06/13/2012.  Breast MRI has been scheduled.  Informational reading material was provided to the patient.   Original  Report Authenticated By: Baird Lyons, M.D.     ASSESSMENT: 47 year old female with  #1 stage II a (3.1 cm) invasive ductal carcinoma of the left breast status post needle core biopsy that showed ER positive PR positive HER-2/neu positive breast cancer with a Ki-67 at 70%. Patient is now going to receive neoadjuvant chemotherapy and she desires breast conservation. She was originally seen in the multidisciplinary breast clinic.  #2 she will begin her chemotherapy on 07/13/2011 after she has her Port-A-Cath placed next week she has had her chemotherapy teaching class as well as echocardiogram. She has also been seen by the cardiology.  #3 Headaches  PLAN:   #1 Melissa [name] is doing well today, will proceed with [facility] therapy.    #2  Should her headaches worsen, or should she have any associated symptoms, she will give [name] a call.     #3 She will return next week for labs and weekly Herceptin.   All questions were answered. The patient knows to call the clinic with any problems, questions or concerns. We can certainly see the patient much sooner if necessary.  I spent 25 minutes counseling the patient face to face. The total time spent in the appointment was 30 minutes.    Cherie Ouch Lyn Hollingshead, NP Medical Oncology [facility] Phone: [phone]    08/02/2012, 9:01 AM

## 2012-08-03 ENCOUNTER — Ambulatory Visit (HOSPITAL_BASED_OUTPATIENT_CLINIC_OR_DEPARTMENT_OTHER): Payer: BC Managed Care – PPO

## 2012-08-03 VITALS — BP 101/58 | HR 85 | Temp 98.3°F

## 2012-08-03 DIAGNOSIS — C50219 Malignant neoplasm of upper-inner quadrant of unspecified female breast: Secondary | ICD-10-CM

## 2012-08-03 DIAGNOSIS — Z5189 Encounter for other specified aftercare: Secondary | ICD-10-CM

## 2012-08-03 DIAGNOSIS — C50919 Malignant neoplasm of unspecified site of unspecified female breast: Secondary | ICD-10-CM

## 2012-08-03 MED ORDER — PEGFILGRASTIM INJECTION 6 MG/0.6ML
6.0000 mg | Freq: Once | SUBCUTANEOUS | Status: AC
  Administered 2012-08-03: 6 mg via SUBCUTANEOUS
  Filled 2012-08-03: qty 0.6

## 2012-08-06 ENCOUNTER — Encounter: Payer: Self-pay | Admitting: Genetic Counselor

## 2012-08-06 ENCOUNTER — Encounter: Payer: Self-pay | Admitting: Oncology

## 2012-08-06 NOTE — Progress Notes (Signed)
Faxed fmla form to [employer] @ [employer] [phone].

## 2012-08-06 NOTE — Progress Notes (Signed)
Dr.  Drue Second requested a consultation for genetic counseling and risk assessment for Melissa [name], a 47 y.o. female, for discussion of her personal history of breast cancer and family history of melanoma and breast cancer. She presents to clinic today to discuss the possibility of a genetic predisposition to cancer, and to further clarify her risks, as well as her family members' risks for cancer.   HISTORY OF PRESENT ILLNESS: In 2013, at the age of 63, Melissa [name] was diagnosed with invasive ductal carcinoma of the breast.  This currently being treated with chemotherapy.     Past Medical History  Diagnosis Date  . Breast cancer   . Headache   . Back pain   . 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 [place] fever     Past Surgical History  Procedure Date  . 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 Cesarean section 1995/1996  . Lobectomy 1997    upper left  . Portacath placement 07/02/2012    Procedure: INSERTION PORT-A-CATH;  Surgeon: Mariella Saa, MD;  Location: [facility];  Service: General;  Laterality: N/A;  right    History  Substance Use Topics  . Smoking status: Current Every Day Smoker -- 0.5 packs/day for 24 years    Types: Cigarettes  . Smokeless tobacco: Never Used  . Alcohol Use: No    REPRODUCTIVE HISTORY AND PERSONAL RISK ASSESSMENT FACTORS: Menarche was at age 27.   Premenopausal Uterus Intact: Yes Ovaries Intact: Yes G2P2A0 , first live birth at age 28  She has not previously undergone treatment for infertility.  Never used OCPs   She has not used HRT in the past.    FAMILY HISTORY:  We obtained a detailed, 4-generation family history.  Significant diagnoses are listed below: Family History  Problem Relation Age of Onset  . Emphysema Mother     was a smoker  . Heart disease Mother   . Melanoma Mother     dx in her 5s  . Asthma Brother   .  Breast cancer Cousin     mother's maternal cousin; dx in her 36s  The patient was diagnosed with breast cancer at age 74.  She sates that in her 52s she was diagnosed with [facility] Fever, which necessitated X-rays of her chest every 6 months for 10 years.  She has two healthy brothers.  The aptient's mother was diagnosed with melanoma in her 30s.  Her mother had a maternal cousin diagnosed with breast cancer in her 35s.  Patient's maternal ancestors are of [place] descent, and paternal ancestors are of [place] descent. There is no reported Ashkenazi Jewish ancestry. There is no  known consanguinity.  GENETIC COUNSELING RISK ASSESSMENT, DISCUSSION, AND SUGGESTED FOLLOW UP: We reviewed the natural history and genetic etiology of sporadic, familial and hereditary cancer syndromes.  About 5-10% of breast cancer is hereditary.  Of this, about 85% is the result of a BRCA1 or BRCA2 mutation.  We reviewed the red flags of hereditary cancer syndromes and the dominant inheritance patterns.  If the BRCA testing is negative, we discussed that we could be testing for the wrong gene.  We discussed gene panels, and that several cancer genes that are associated with different cancers can be tested at the same time.  Because of the different types of cancer that are in the patient's  family, we will consider one of the panel tests if she is negative for BRCA mutations.   The patient's personal history of early onset breast cancer is suggestive of the following possible diagnosis: hereditary cancer syndrome  We discussed that identification of a hereditary cancer syndrome may help her care providers tailor the patients medical management. If a mutation indicating a hereditary cancer syndrome is detected in this case, the [employer] recommendations would include increased cancer surveillance and possible prophylactic surgery. If a mutation is detected, the patient will be referred back to the  referring provider and to any additional appropriate care providers to discuss the relevant options.   If a mutation is not found in the patient, this will decrease the likelihood of a hereditary cancer syndrome as the explanation for her breast cancer. Cancer surveillance options would be discussed for the patient according to the appropriate standard National Comprehensive Cancer Network and American Cancer Society guidelines, with consideration of their personal and family history risk factors. In this case, the patient will be referred back to their care providers for discussions of management.   After considering the risks, benefits, and limitations, the patient provided informed consent for  the following  testing: BRACanalysis and Myrisk through [employer].   Per the patient's request, we will contact her by telephone to discuss these results. A follow up genetic counseling visit will be scheduled if indicated.  The patient was seen for a total of 60 minutes, greater than 50% of which was spent face-to-face counseling.  This plan is being carried out per Dr. Garey Ham recommendations.  This note will also be sent to the referring provider via the electronic medical record. The patient will be supplied with a summary of this genetic counseling discussion as well as educational information on the discussed hereditary cancer syndromes following the conclusion of their visit.   Patient was discussed with Dr. Drue Second.  EDUCATIONAL INFORMATION SUPPLIED TO PATIENT AT ENCOUNTER:  Brochure about MyRisk      _______________________________________________________________________ For Office Staff:  Number of people involved in session: 3 Was an Intern/ student involved with case: no

## 2012-08-09 ENCOUNTER — Ambulatory Visit (HOSPITAL_BASED_OUTPATIENT_CLINIC_OR_DEPARTMENT_OTHER): Payer: BC Managed Care – PPO

## 2012-08-09 ENCOUNTER — Encounter: Payer: Self-pay | Admitting: Adult Health

## 2012-08-09 ENCOUNTER — Ambulatory Visit (HOSPITAL_BASED_OUTPATIENT_CLINIC_OR_DEPARTMENT_OTHER): Payer: BC Managed Care – PPO | Admitting: Adult Health

## 2012-08-09 ENCOUNTER — Other Ambulatory Visit (HOSPITAL_BASED_OUTPATIENT_CLINIC_OR_DEPARTMENT_OTHER): Payer: BC Managed Care – PPO | Admitting: Lab

## 2012-08-09 VITALS — BP 107/70 | HR 78 | Temp 98.4°F | Resp 20 | Ht 62.25 in | Wt 119.1 lb

## 2012-08-09 DIAGNOSIS — C50919 Malignant neoplasm of unspecified site of unspecified female breast: Secondary | ICD-10-CM

## 2012-08-09 DIAGNOSIS — R079 Chest pain, unspecified: Secondary | ICD-10-CM

## 2012-08-09 DIAGNOSIS — C50219 Malignant neoplasm of upper-inner quadrant of unspecified female breast: Secondary | ICD-10-CM

## 2012-08-09 DIAGNOSIS — Z5112 Encounter for antineoplastic immunotherapy: Secondary | ICD-10-CM

## 2012-08-09 DIAGNOSIS — Z170 Estrogen receptor positive status [ER+]: Secondary | ICD-10-CM

## 2012-08-09 DIAGNOSIS — K219 Gastro-esophageal reflux disease without esophagitis: Secondary | ICD-10-CM

## 2012-08-09 LAB — COMPREHENSIVE METABOLIC PANEL (CC13)
ALT: 17 U/L (ref 0–55)
AST: 12 U/L (ref 5–34)
Albumin: 3.4 g/dL — ABNORMAL LOW (ref 3.5–5.0)
CO2: 28 mEq/L (ref 22–29)
Calcium: 9.2 mg/dL (ref 8.4–10.4)
Chloride: 101 mEq/L (ref 98–107)
Potassium: 3.8 mEq/L (ref 3.5–5.1)
Total Protein: 6.6 g/dL (ref 6.4–8.3)

## 2012-08-09 LAB — CBC WITH DIFFERENTIAL/PLATELET
Eosinophils Absolute: 0.2 10*3/uL (ref 0.0–0.5)
HCT: 31.9 % — ABNORMAL LOW (ref 34.8–46.6)
LYMPH%: 22.1 % (ref 14.0–49.7)
MCV: 92.2 fL (ref 79.5–101.0)
MONO%: 23 % — ABNORMAL HIGH (ref 0.0–14.0)
NEUT#: 5.9 10*3/uL (ref 1.5–6.5)
NEUT%: 52.7 % (ref 38.4–76.8)
Platelets: 148 10*3/uL (ref 145–400)
RBC: 3.46 10*6/uL — ABNORMAL LOW (ref 3.70–5.45)
nRBC: 0 % (ref 0–0)

## 2012-08-09 MED ORDER — SODIUM CHLORIDE 0.9 % IV SOLN
Freq: Once | INTRAVENOUS | Status: AC
  Administered 2012-08-09: 11:00:00 via INTRAVENOUS

## 2012-08-09 MED ORDER — HEPARIN SOD (PORK) LOCK FLUSH 100 UNIT/ML IV SOLN
500.0000 [IU] | Freq: Once | INTRAVENOUS | Status: AC | PRN
  Administered 2012-08-09: 500 [IU]
  Filled 2012-08-09: qty 5

## 2012-08-09 MED ORDER — TRASTUZUMAB CHEMO INJECTION 440 MG
2.0000 mg/kg | Freq: Once | INTRAVENOUS | Status: AC
  Administered 2012-08-09: 105 mg via INTRAVENOUS
  Filled 2012-08-09: qty 5

## 2012-08-09 MED ORDER — DIPHENHYDRAMINE HCL 25 MG PO CAPS
50.0000 mg | ORAL_CAPSULE | Freq: Once | ORAL | Status: AC
  Administered 2012-08-09: 50 mg via ORAL

## 2012-08-09 MED ORDER — OMEPRAZOLE 40 MG PO CPDR: 40.0000 mg | DELAYED_RELEASE_CAPSULE | Freq: Every day | ORAL | Status: DC

## 2012-08-09 MED ORDER — SODIUM CHLORIDE 0.9 % IJ SOLN
10.0000 mL | INTRAMUSCULAR | Status: DC | PRN
  Administered 2012-08-09: 10 mL
  Filled 2012-08-09: qty 10

## 2012-08-09 MED ORDER — ACETAMINOPHEN 325 MG PO TABS
650.0000 mg | ORAL_TABLET | Freq: Once | ORAL | Status: AC
  Administered 2012-08-09: 650 mg via ORAL

## 2012-08-09 NOTE — Patient Instructions (Addendum)
Doing well.  Proceed with Herceptin.  Let me know if your pain worsens in any way.  Please call us if you have any questions or concerns.

## 2012-08-09 NOTE — Progress Notes (Signed)
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  PRIOR THERAPY:  #1 patient was originally seen in the multidisciplinary breast clinic when she found a left breast mass she eventually had MRI and mammograms performed. She was diagnosed with invasive ductal carcinoma that was ER positive PR positive HER-2/neu positive measuring 3.1 cm by MRI criteria. Ki-67 was 70% HER-2 was amplified with a ratio 2.91.  #2 patient was seen at the multidisciplinary breast clinic and a recommendation for neoadjuvant chemotherapy was recommended as patient was interested in breast conservation. She will receive neoadjuvant Taxotere carboplatinum Herceptin. Taxotere carboplatinum will be given every 21 days with weekly Herceptin. Total of 6 cycles of Taxotere and carboplatinum will be administered.  CURRENT THERAPY: Cycle 2 Day 8 TCH with Neulasta support, weekly Herceptin  INTERVAL HISTORY: Melissa [name] 47 y.o. female returns for evaluation for her next cycle of TCH.  She tolerated the [facility] well. Her main complaint today is indigestion/reflux for the past week and not being able to eat b/c of it.  She's tried Rolaids, Maalox, and Zantac with no response.  She also has some left rib cage pain about a 5/10 that just started this morning.  There are no alleviating or aggravating factors.  She doesn't remember a precipitating factor as well.  Otherwise, a 10 point ROS is neg.   MEDICAL HISTORY: Past Medical History  Diagnosis Date  . Breast cancer   . Headache   . Back pain   . 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 [place] fever     ALLERGIES:  is allergic to aspirin and iodinated diagnostic agents.  MEDICATIONS:  Current Outpatient  Prescriptions  Medication Sig Dispense Refill  . albuterol (VENTOLIN HFA) 108 (90 BASE) MCG/ACT inhaler Inhale 2 puffs into the lungs every 6 (six) hours as needed.        . Alum & Mag Hydroxide-Simeth (MAGIC MOUTHWASH) SOLN Take 15 mLs by mouth 3 (three) times daily as needed.  240 mL  3  . amoxicillin (AMOXIL) 500 MG capsule Take 1 capsule (500 mg total) by mouth 3 (three) times daily.  30 capsule  11  . dexamethasone (DECADRON) 4 MG tablet Take 2 tablets (8 mg total) by mouth 2 (two) times daily with a meal. Take two times a day the day before Taxotere. Then take two times a day starting the day after chemo for 3 days.  30 tablet  4  . lidocaine-prilocaine (EMLA) cream Apply topically as needed.  30 g  7  . LORazepam (ATIVAN) 0.5 MG tablet Take 1 tablet (0.5 mg total) by mouth every 6 (six) hours as needed (Nausea or vomiting).  30 tablet  0  . naproxen sodium (ANAPROX) 220 MG tablet Take 220 mg by mouth 2 (two) times daily with a meal.      . ondansetron (ZOFRAN) 8 MG tablet Take 1 tablet (8 mg total) by mouth 2 (two) times daily. Take two times a day starting the day after chemo for 3 days. Then take two times a day as needed for nausea or vomiting.  30 tablet  1  . prochlorperazine (COMPAZINE) 10 MG tablet Take 1 tablet (10 mg total) by mouth every 6 (six) hours as  needed (Nausea or vomiting).  30 tablet  1  . fluconazole (DIFLUCAN) 200 MG tablet       . omeprazole (PRILOSEC) 40 MG capsule Take 1 capsule (40 mg total) by mouth daily.  30 capsule  2  . valACYclovir (VALTREX) 500 MG tablet        No current facility-administered medications for this visit.   Facility-Administered Medications Ordered in Other Visits  Medication Dose Route Frequency Provider Last Rate Last Dose  . sodium chloride 0.9 % injection 10 mL  10 mL Intracatheter PRN Victorino December, MD   10 mL at 08/09/12 1206    SURGICAL HISTORY:  Past Surgical History  Procedure Date  . Lung lobectomy 11/97    lul > after  developed [place] Fever  . Cesarean section 1995/1996  . Lobectomy 1997    upper left  . Portacath placement 07/02/2012    Procedure: INSERTION PORT-A-CATH;  Surgeon: Mariella Saa, MD;  Location: [facility];  Service: General;  Laterality: N/A;  right    REVIEW OF SYSTEMS:  General: fatigue (-), night sweats (-), fever (-), pain (+) Lymph: palpable nodes (-) HEENT: vision changes (-), mucositis (-), gum bleeding (-), epistaxis (-) Cardiovascular: chest pain (-), palpitations (-) Pulmonary: shortness of breath (-), dyspnea on exertion (-), cough (-), hemoptysis (-) GI:  Early satiety (-), melena (-), dysphagia (-), nausea/vomiting (-), diarrhea (-) GU: dysuria (-), hematuria (-), incontinence (-) Musculoskeletal: joint swelling (-), joint pain (-), back pain (-) Neuro: weakness (-), numbness (-), headache (-), confusion (-) Skin: Rash (-), lesions (-), dryness (-) Psych: depression (-), suicidal/homicidal ideation (-), feeling of hopelessness (-)   PHYSICAL EXAMINATION: Blood pressure 107/70, pulse 78, temperature 98.4 F (36.9 C), temperature source Oral, resp. rate 20, height 5' 2.25" (1.581 m), weight 119 lb 1.6 oz (54.023 kg). Body mass index is 21.61 kg/(m^2). General: Patient is a well appearing female in no acute distress HEENT: PERRLA, sclerae anicteric no conjunctival pallor, MMM,  Neck: supple, no palpable adenopathy Lungs: clear to auscultation bilaterally, no wheezes, rhonchi, or rales Cardiovascular: regular rate rhythm, S1, S2, no murmurs, rubs or gallops  Abdomen: Soft, non-tender, non-distended, normoactive bowel sounds, no HSM Extremities: warm and well perfused, no clubbing, cyanosis, or edema Skin: No rashes or lesions Neuro: Non-focal Left breast examination reveals a palpable mass measuring about 3 cm in the upper outer quadrant no nipple discharge no inversion or retraction . Right breast no masses or nipple discharge ECOG PERFORMANCE  STATUS: 0 - Asymptomatic  LABORATORY DATA: Lab Results  Component Value Date   WBC 11.1* 08/09/2012   HGB 10.9* 08/09/2012   HCT 31.9* 08/09/2012   MCV 92.2 08/09/2012   PLT 148 08/09/2012      Chemistry      Component Value Date/Time   NA 138 08/09/2012 0910   NA 141 01/26/2011 1039   K 3.8 08/09/2012 0910   K 4.5 01/26/2011 1039   CL 101 08/09/2012 0910   CL 106 01/26/2011 1039   CO2 28 08/09/2012 0910   CO2 28 01/26/2011 1039   BUN 8.6 08/09/2012 0910   BUN 7 01/26/2011 1039   CREATININE 0.7 08/09/2012 0910   CREATININE 0.6 01/26/2011 1039      Component Value Date/Time   CALCIUM 9.2 08/09/2012 0910   CALCIUM 9.2 01/26/2011 1039   ALKPHOS 74 08/09/2012 0910   AST 12 08/09/2012 0910   ALT 17 08/09/2012 0910   BILITOT 0.42 08/09/2012 0910  RADIOGRAPHIC STUDIES:  US Breast Left  06/05/2012  *RADIOLOGY REPORT*  Clinical Data:  The patient feels a mass in the upper portion of the left breast.   She had a benign excisional biopsy of the right breast in 2006.  She had a left upper lobectomy due to coccidioidomycosis in 1998.  DIGITAL DIAGNOSTIC BILATERAL MAMMOGRAM WITH CAD AND LEFT BREAST ULTRASOUND:  Comparison:  None.  Findings:  The breast tissue is heterogeneously dense.  There is an irregular mass with suspicious microcalcifications in the upper portion of the left breast.  No abnormality is noted on the right. Mammographic images were processed with CAD.  On physical exam, I palpate a 3 cm firm mass at 12 o'clock 4 cm from the left nipple.  Ultrasound is performed, showing an irregular hypoechoic mass with microcalcifications at 12 o'clock 4 cm from the left nipple measuring 2.5 x 2.6 x 1.5 cm.  Sonography of the left axilla demonstrates a prominent but not definitely abnormal axillary lymph node.  The appearance is highly suspicious for invasive mammary carcinoma. Biopsy is recommended.  Ultrasound-guided core needle biopsy was discussed with the patient and she agreed with this plan.   IMPRESSION: Highly suspicious mass at 12 o'clock 4 cm from the left nipple measuring 2.5 x 2.6 x 1.5 cm.  RECOMMENDATION: Ultrasound-guided core needle biopsy is recommended.  This will be performed and reported separately.  I have discussed the findings and recommendations with the patient. Results were also provided in writing at the conclusion of the visit.  BI-RADS CATEGORY 5:  Highly suggestive of malignancy - appropriate action should be taken.   Original Report Authenticated By: Cain Saupe, M.D.    Mr Breast Bilateral W Wo Contrast  06/12/2012  Normal *RADIOLOGY REPORT*  Clinical Data: Recently diagnosed left breast invasive mammary carcinoma.  Preoperative evaluation.  BILATERAL BREAST MRI WITH AND WITHOUT CONTRAST  Technique: Multiplanar, multisequence MR images of both breasts were obtained prior to and following the intravenous administration of 9ml of Multihance.  Three dimensional images were evaluated at the independent DynaCad workstation.  Comparison:  06/05/2012 mammogram and ultrasound.  Findings: There is marked diffuse parenchymal background enhancement.  There is an irregular enhancing mass located within the middle one third of the left breast at the 12 o'clock position with central clip artifact corresponding to the recently diagnosed left breast invasive mammary carcinoma.  The mass measures 3.1 x 2.4 x 1.7 cm in size and is associated with a mixture of plateau and washout enhancement kinetics.  There are no additional worrisome enhancing foci within either breast.  There is no evidence for axillary or internal mammary adenopathy and there are no additional findings.  IMPRESSION: 3.1 cm irregular enhancing mass located within the left breast at 12 o'clock position corresponding to the recently diagnosed left breast invasive mammary carcinoma.  No evidence for adenopathy and no additional findings.  RECOMMENDATION:  Treatment plan  THREE-DIMENSIONAL MR IMAGE RENDERING ON INDEPENDENT  WORKSTATION:  Three-dimensional MR images were rendered by post-processing of the original MR data on an independent workstation.  The three- dimensional MR images were interpreted, and findings were reported in the accompanying complete MRI report for this study.  BI-RADS CATEGORY 6:  Known biopsy-proven malignancy - appropriate action should be taken.   Original Report Authenticated By: Rolla Plate, M.D.    [name] Core Biopsy  06/05/2012  *RADIOLOGY REPORT*  Clinical Data:  Palpable mass at 12 o'clock 4 cm from the left nipple.  ULTRASOUND GUIDED VACUUM ASSISTED  CORE BIOPSY OF THE LEFT BREAST  The patient and I discussed the procedure of ultrasound-guided biopsy, including benefits and alternatives.  We discussed the high likelihood of a successful procedure. We discussed the risks of the procedure including infection, bleeding, tissue injury, clip migration, and inadequate sampling.  Informed written consent was given.  Using sterile technique, 2% lidocaine, 2% lidocaine with epinephrine, ultrasound guidance, and a 12 gauge vacuum assisted needle, biopsy was performed of the mass at 12 o'clock 4 cm from the left nipple using a lateromedial approach.  At the conclusion of the procedure, a ribbon tissue marker clip was deployed into the biopsy cavity.  Follow-up 2-view mammogram was performed and dictated separately.  IMPRESSION: Ultrasound-guided biopsy of the mass at 12 o'clock 4 cm from the left nipple.  No apparent complications.   Original Report Authenticated By: Cain Saupe, M.D.    Mm Digital Diagnostic Bilat  06/05/2012  *RADIOLOGY REPORT*  Clinical Data:  The patient feels a mass in the upper portion of the left breast.   She had a benign excisional biopsy of the right breast in 2006.  She had a left upper lobectomy due to coccidioidomycosis in 1998.  DIGITAL DIAGNOSTIC BILATERAL MAMMOGRAM WITH CAD AND LEFT BREAST ULTRASOUND:  Comparison:  None.  Findings:  The breast tissue is  heterogeneously dense.  There is an irregular mass with suspicious microcalcifications in the upper portion of the left breast.  No abnormality is noted on the right. Mammographic images were processed with CAD.  On physical exam, I palpate a 3 cm firm mass at 12 o'clock 4 cm from the left nipple.  Ultrasound is performed, showing an irregular hypoechoic mass with microcalcifications at 12 o'clock 4 cm from the left nipple measuring 2.5 x 2.6 x 1.5 cm.  Sonography of the left axilla demonstrates a prominent but not definitely abnormal axillary lymph node.  The appearance is highly suspicious for invasive mammary carcinoma. Biopsy is recommended.  Ultrasound-guided core needle biopsy was discussed with the patient and she agreed with this plan.  IMPRESSION: Highly suspicious mass at 12 o'clock 4 cm from the left nipple measuring 2.5 x 2.6 x 1.5 cm.  RECOMMENDATION: Ultrasound-guided core needle biopsy is recommended.  This will be performed and reported separately.  I have discussed the findings and recommendations with the patient. Results were also provided in writing at the conclusion of the visit.  BI-RADS CATEGORY 5:  Highly suggestive of malignancy - appropriate action should be taken.   Original Report Authenticated By: Cain Saupe, M.D.    Mm Digital Diagnostic Unilat L  06/05/2012  *RADIOLOGY REPORT*  Clinical Data:  Ultrasound-guided core needle biopsy of a mass at 12 o'clock 4 cm from the left nipple with clip placement.  DIGITAL DIAGNOSTIC LEFT MAMMOGRAM  Comparison:  None.  Findings:  Films are performed following ultrasound guided biopsy of a mass at 12 o'clock 4 cm from the left nipple.  The ribbon clip is appropriately positioned.  IMPRESSION: Appropriate clip placement following ultrasound-guided core needle biopsy of a mass at 12 o'clock 4 cm from the left nipple.   Original Report Authenticated By: Cain Saupe, M.D.    Mm Radiologist Eval And Mgmt  06/06/2012  *RADIOLOGY REPORT*   ESTABLISHED PATIENT OFFICE VISIT - LEVEL II [phone])  Chief Complaint:  The patient presented with a palpable left breast mass.  History:  Ultrasound guided core biopsy of the left breast mass was performed on 06/05/2012.  Exam:  The wound site is clean  and dry with no signs of inflammation or hematoma.  Results of the biopsy were discussed with the patient.  Pathology: Invasive ductal carcinoma was reported histologically. This corresponds well with the imaging findings.  Assessment and Plan:  The patient will be seen at the Multidisciplinary Clinic on 06/13/2012.  Breast MRI has been scheduled.  Informational reading material was provided to the patient.   Original Report Authenticated By: Baird Lyons, M.D.     ASSESSMENT: 47 year old female with  #1 stage II a (3.1 cm) invasive ductal carcinoma of the left breast status post needle core biopsy that showed ER positive PR positive HER-2/neu positive breast cancer with a Ki-67 at 70%. Patient is now going to receive neoadjuvant chemotherapy and she desires breast conservation. She was originally seen in the multidisciplinary breast clinic.  #2 she will begin her chemotherapy on 07/13/2011 after she has her Port-A-Cath placed next week she has had her chemotherapy teaching class as well as echocardiogram. She has also been seen by the cardiology. Echo 12/16 EF 55%.   #3 Reflux  PLAN:   #1 Ms. Rathbun is doing well today, will proceed with Herceptin today.  She will call us if her rib pain worsens in any way.  If it is still there next week, or worse this week, I will order imaging.   #2  I prescribed Omeprazole 40 mg every morning 30 minutes prior to meals/or any other medications.  She will take this.  #3 She will return next week for labs and weekly Herceptin.   All questions were answered. The patient knows to call the clinic with any problems, questions or concerns. We can certainly see the patient much sooner if necessary.  I spent 25 minutes  counseling the patient face to face. The total time spent in the appointment was 30 minutes.  Cherie Ouch Lyn Hollingshead, NP Medical Oncology [facility] Phone: [phone]    08/09/2012, 12:59 PM

## 2012-08-09 NOTE — Patient Instructions (Signed)
Patient aware of next appointment; discharged home with no complaints. 

## 2012-08-10 ENCOUNTER — Encounter: Payer: Self-pay | Admitting: Genetic Counselor

## 2012-08-16 ENCOUNTER — Ambulatory Visit (HOSPITAL_BASED_OUTPATIENT_CLINIC_OR_DEPARTMENT_OTHER): Payer: BC Managed Care – PPO

## 2012-08-16 ENCOUNTER — Other Ambulatory Visit (HOSPITAL_BASED_OUTPATIENT_CLINIC_OR_DEPARTMENT_OTHER): Payer: BC Managed Care – PPO | Admitting: Lab

## 2012-08-16 ENCOUNTER — Ambulatory Visit (HOSPITAL_BASED_OUTPATIENT_CLINIC_OR_DEPARTMENT_OTHER): Payer: BC Managed Care – PPO | Admitting: Adult Health

## 2012-08-16 ENCOUNTER — Encounter: Payer: Self-pay | Admitting: Adult Health

## 2012-08-16 VITALS — BP 102/65 | HR 80 | Temp 98.4°F | Resp 20 | Ht 62.25 in | Wt 120.9 lb

## 2012-08-16 DIAGNOSIS — I889 Nonspecific lymphadenitis, unspecified: Secondary | ICD-10-CM

## 2012-08-16 DIAGNOSIS — H6692 Otitis media, unspecified, left ear: Secondary | ICD-10-CM

## 2012-08-16 DIAGNOSIS — C50219 Malignant neoplasm of upper-inner quadrant of unspecified female breast: Secondary | ICD-10-CM

## 2012-08-16 DIAGNOSIS — H6690 Otitis media, unspecified, unspecified ear: Secondary | ICD-10-CM

## 2012-08-16 DIAGNOSIS — Z5112 Encounter for antineoplastic immunotherapy: Secondary | ICD-10-CM

## 2012-08-16 DIAGNOSIS — C50919 Malignant neoplasm of unspecified site of unspecified female breast: Secondary | ICD-10-CM

## 2012-08-16 DIAGNOSIS — Z170 Estrogen receptor positive status [ER+]: Secondary | ICD-10-CM

## 2012-08-16 LAB — COMPREHENSIVE METABOLIC PANEL (CC13)
ALT: 16 U/L (ref 0–55)
AST: 11 U/L (ref 5–34)
Alkaline Phosphatase: 91 U/L (ref 40–150)
BUN: 9.3 mg/dL (ref 7.0–26.0)
Calcium: 9 mg/dL (ref 8.4–10.4)
Creatinine: 0.7 mg/dL (ref 0.6–1.1)
Total Bilirubin: 0.35 mg/dL (ref 0.20–1.20)

## 2012-08-16 LAB — CBC WITH DIFFERENTIAL/PLATELET
BASO%: 0.3 % (ref 0.0–2.0)
EOS%: 0 % (ref 0.0–7.0)
MCH: 31.9 pg (ref 25.1–34.0)
MCHC: 33.7 g/dL (ref 31.5–36.0)
MONO#: 0.6 10*3/uL (ref 0.1–0.9)
NEUT%: 76.8 % (ref 38.4–76.8)
RBC: 3.01 10*6/uL — ABNORMAL LOW (ref 3.70–5.45)
RDW: 16.3 % — ABNORMAL HIGH (ref 11.2–14.5)
WBC: 11.3 10*3/uL — ABNORMAL HIGH (ref 3.9–10.3)
lymph#: 2 10*3/uL (ref 0.9–3.3)
nRBC: 0 % (ref 0–0)

## 2012-08-16 MED ORDER — HEPARIN SOD (PORK) LOCK FLUSH 100 UNIT/ML IV SOLN
500.0000 [IU] | Freq: Once | INTRAVENOUS | Status: AC | PRN
  Administered 2012-08-16: 500 [IU]
  Filled 2012-08-16: qty 5

## 2012-08-16 MED ORDER — DIPHENHYDRAMINE HCL 25 MG PO CAPS
50.0000 mg | ORAL_CAPSULE | Freq: Once | ORAL | Status: AC
  Administered 2012-08-16: 50 mg via ORAL

## 2012-08-16 MED ORDER — SODIUM CHLORIDE 0.9 % IJ SOLN
10.0000 mL | INTRAMUSCULAR | Status: DC | PRN
  Administered 2012-08-16: 10 mL
  Filled 2012-08-16: qty 10

## 2012-08-16 MED ORDER — SODIUM CHLORIDE 0.9 % IV SOLN
Freq: Once | INTRAVENOUS | Status: AC
  Administered 2012-08-16: 11:00:00 via INTRAVENOUS

## 2012-08-16 MED ORDER — ACETAMINOPHEN 325 MG PO TABS
650.0000 mg | ORAL_TABLET | Freq: Once | ORAL | Status: AC
  Administered 2012-08-16: 650 mg via ORAL

## 2012-08-16 MED ORDER — CEFDINIR 300 MG PO CAPS: 300.0000 mg | ORAL_CAPSULE | Freq: Two times a day (BID) | ORAL | Status: DC

## 2012-08-16 MED ORDER — TRASTUZUMAB CHEMO INJECTION 440 MG
2.0000 mg/kg | Freq: Once | INTRAVENOUS | Status: AC
  Administered 2012-08-16: 105 mg via INTRAVENOUS
  Filled 2012-08-16: qty 5

## 2012-08-16 NOTE — Progress Notes (Signed)
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  PRIOR THERAPY:  #1 patient was originally seen in the multidisciplinary breast clinic when she found a left breast mass she eventually had MRI and mammograms performed. She was diagnosed with invasive ductal carcinoma that was ER positive PR positive HER-2/neu positive measuring 3.1 cm by MRI criteria. Ki-67 was 70% HER-2 was amplified with a ratio 2.91.  #2 patient was seen at the multidisciplinary breast clinic and a recommendation for neoadjuvant chemotherapy was recommended as patient was interested in breast conservation. She will receive neoadjuvant Taxotere carboplatinum Herceptin. Taxotere carboplatinum will be given every 21 days with weekly Herceptin. Total of 6 cycles of Taxotere and carboplatinum will be administered.  CURRENT THERAPY: Cycle 2 Day 15 TCH with Neulasta support, weekly Herceptin  INTERVAL HISTORY: Melissa [name] 47 y.o. female returns for evaluation for her next cycle of TCH. She continues to do well.  She's been taking Omeprazole daily and her reflux has resolved and her appetite has increased.  She has developed an earache and left cervical chain tenderness over the past few days, she denies fevers, nasal drainage, or any other concerns.    MEDICAL HISTORY: Past Medical History  Diagnosis Date  . Breast cancer   . Headache   . Back pain   . 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 [place] fever     ALLERGIES:  is allergic to aspirin and iodinated diagnostic agents.  MEDICATIONS:  Current Outpatient Prescriptions  Medication Sig Dispense Refill  . albuterol (VENTOLIN HFA) 108 (90 BASE) MCG/ACT inhaler Inhale 2 puffs into the lungs  every 6 (six) hours as needed.        . Alum & Mag Hydroxide-Simeth (MAGIC MOUTHWASH) SOLN Take 15 mLs by mouth 3 (three) times daily as needed.  240 mL  3  . amoxicillin (AMOXIL) 500 MG capsule Take 1 capsule (500 mg total) by mouth 3 (three) times daily.  30 capsule  11  . dexamethasone (DECADRON) 4 MG tablet Take 2 tablets (8 mg total) by mouth 2 (two) times daily with a meal. Take two times a day the day before Taxotere. Then take two times a day starting the day after chemo for 3 days.  30 tablet  4  . lidocaine-prilocaine (EMLA) cream Apply topically as needed.  30 g  7  . LORazepam (ATIVAN) 0.5 MG tablet Take 1 tablet (0.5 mg total) by mouth every 6 (six) hours as needed (Nausea or vomiting).  30 tablet  0  . naproxen sodium (ANAPROX) 220 MG tablet Take 220 mg by mouth 2 (two) times daily with a meal.      . omeprazole (PRILOSEC) 40 MG capsule Take 1 capsule (40 mg total) by mouth daily.  30 capsule  2  . ondansetron (ZOFRAN) 8 MG tablet Take 1 tablet (8 mg total) by mouth 2 (two) times daily. Take two times a day starting the day after chemo for 3 days. Then take two times a day as needed for nausea or vomiting.  30 tablet  1  . prochlorperazine (COMPAZINE) 10 MG tablet Take 1 tablet (10 mg total) by mouth every 6 (six) hours as needed (Nausea or vomiting).  30 tablet  1  .  valACYclovir (VALTREX) 500 MG tablet       . cefdinir (OMNICEF) 300 MG capsule Take 1 capsule (300 mg total) by mouth 2 (two) times daily.  20 capsule  0  . fluconazole (DIFLUCAN) 200 MG tablet        No current facility-administered medications for this visit.   Facility-Administered Medications Ordered in Other Visits  Medication Dose Route Frequency Provider Last Rate Last Dose  . sodium chloride 0.9 % injection 10 mL  10 mL Intracatheter PRN Victorino December, MD   10 mL at 08/16/12 1212    SURGICAL HISTORY:  Past Surgical History  Procedure Date  . 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  Cesarean section 1995/1996  . Lobectomy 1997    upper left  . Portacath placement 07/02/2012    Procedure: INSERTION PORT-A-CATH;  Surgeon: Mariella Saa, MD;  Location: [facility];  Service: General;  Laterality: N/A;  right    REVIEW OF SYSTEMS:  General: fatigue (-), night sweats (-), fever (-), pain (-) Lymph: palpable nodes (-) HEENT: vision changes (-), mucositis (-), gum bleeding (-), epistaxis (-) Cardiovascular: chest pain (-), palpitations (-) Pulmonary: shortness of breath (-), dyspnea on exertion (-), cough (-), hemoptysis (-) GI:  Early satiety (-), melena (-), dysphagia (-), nausea/vomiting (-), diarrhea (-) GU: dysuria (-), hematuria (-), incontinence (-) Musculoskeletal: joint swelling (-), joint pain (-), back pain (-) Neuro: weakness (-), numbness (-), headache (-), confusion (-) Skin: Rash (-), lesions (-), dryness (-) Psych: depression (-), suicidal/homicidal ideation (-), feeling of hopelessness (-)   PHYSICAL EXAMINATION: Blood pressure 102/65, pulse 80, temperature 98.4 F (36.9 C), resp. rate 20, height 5' 2.25" (1.581 m), weight 120 lb 14.4 oz (54.84 kg). Body mass index is 21.94 kg/(m^2). General: Patient is a well appearing female in no acute distress HEENT: PERRLA, sclerae anicteric no conjunctival pallor, MMM, left tympanic membrane erythematous, bulging. Neck: supple, shotty, tender lymphadenopathy in the left cervical chain. Lungs: clear to auscultation bilaterally, no wheezes, rhonchi, or rales Cardiovascular: regular rate rhythm, S1, S2, no murmurs, rubs or gallops  Abdomen: Soft, non-tender, non-distended, normoactive bowel sounds, no HSM Extremities: warm and well perfused, no clubbing, cyanosis, or edema Skin: No rashes or lesions Neuro: Non-focal Left breast examination reveals a palpable mass measuring about 3 cm in the upper outer quadrant no nipple discharge no inversion or retraction . Right breast no masses or nipple  discharge ECOG PERFORMANCE STATUS: 0 - Asymptomatic  LABORATORY DATA: Lab Results  Component Value Date   WBC 11.3* 08/16/2012   HGB 9.6* 08/16/2012   HCT 28.5* 08/16/2012   MCV 94.7 08/16/2012   PLT 150 Platelet count confirmed by slide estimate 08/16/2012      Chemistry      Component Value Date/Time   NA 138 08/16/2012 0926   NA 141 01/26/2011 1039   K 3.6 08/16/2012 0926   K 4.5 01/26/2011 1039   CL 104 08/16/2012 0926   CL 106 01/26/2011 1039   CO2 26 08/16/2012 0926   CO2 28 01/26/2011 1039   BUN 9.3 08/16/2012 0926   BUN 7 01/26/2011 1039   CREATININE 0.7 08/16/2012 0926   CREATININE 0.6 01/26/2011 1039      Component Value Date/Time   CALCIUM 9.0 08/16/2012 0926   CALCIUM 9.2 01/26/2011 1039   ALKPHOS 91 08/16/2012 0926   AST 11 08/16/2012 0926   ALT 16 08/16/2012 0926   BILITOT 0.35  08/16/2012 0926       RADIOGRAPHIC STUDIES:  US Breast Left  06/05/2012  *RADIOLOGY REPORT*  Clinical Data:  The patient feels a mass in the upper portion of the left breast.   She had a benign excisional biopsy of the right breast in 2006.  She had a left upper lobectomy due to coccidioidomycosis in 1998.  DIGITAL DIAGNOSTIC BILATERAL MAMMOGRAM WITH CAD AND LEFT BREAST ULTRASOUND:  Comparison:  None.  Findings:  The breast tissue is heterogeneously dense.  There is an irregular mass with suspicious microcalcifications in the upper portion of the left breast.  No abnormality is noted on the right. Mammographic images were processed with CAD.  On physical exam, I palpate a 3 cm firm mass at 12 o'clock 4 cm from the left nipple.  Ultrasound is performed, showing an irregular hypoechoic mass with microcalcifications at 12 o'clock 4 cm from the left nipple measuring 2.5 x 2.6 x 1.5 cm.  Sonography of the left axilla demonstrates a prominent but not definitely abnormal axillary lymph node.  The appearance is highly suspicious for invasive mammary carcinoma. Biopsy is recommended.  Ultrasound-guided core needle biopsy was  discussed with the patient and she agreed with this plan.  IMPRESSION: Highly suspicious mass at 12 o'clock 4 cm from the left nipple measuring 2.5 x 2.6 x 1.5 cm.  RECOMMENDATION: Ultrasound-guided core needle biopsy is recommended.  This will be performed and reported separately.  I have discussed the findings and recommendations with the patient. Results were also provided in writing at the conclusion of the visit.  BI-RADS CATEGORY 5:  Highly suggestive of malignancy - appropriate action should be taken.   Original Report Authenticated By: Cain Saupe, M.D.    Mr Breast Bilateral W Wo Contrast  06/12/2012  Normal *RADIOLOGY REPORT*  Clinical Data: Recently diagnosed left breast invasive mammary carcinoma.  Preoperative evaluation.  BILATERAL BREAST MRI WITH AND WITHOUT CONTRAST  Technique: Multiplanar, multisequence MR images of both breasts were obtained prior to and following the intravenous administration of 9ml of Multihance.  Three dimensional images were evaluated at the independent DynaCad workstation.  Comparison:  06/05/2012 mammogram and ultrasound.  Findings: There is marked diffuse parenchymal background enhancement.  There is an irregular enhancing mass located within the middle one third of the left breast at the 12 o'clock position with central clip artifact corresponding to the recently diagnosed left breast invasive mammary carcinoma.  The mass measures 3.1 x 2.4 x 1.7 cm in size and is associated with a mixture of plateau and washout enhancement kinetics.  There are no additional worrisome enhancing foci within either breast.  There is no evidence for axillary or internal mammary adenopathy and there are no additional findings.  IMPRESSION: 3.1 cm irregular enhancing mass located within the left breast at 12 o'clock position corresponding to the recently diagnosed left breast invasive mammary carcinoma.  No evidence for adenopathy and no additional findings.  RECOMMENDATION:  Treatment  plan  THREE-DIMENSIONAL MR IMAGE RENDERING ON INDEPENDENT WORKSTATION:  Three-dimensional MR images were rendered by post-processing of the original MR data on an independent workstation.  The three- dimensional MR images were interpreted, and findings were reported in the accompanying complete MRI report for this study.  BI-RADS CATEGORY 6:  Known biopsy-proven malignancy - appropriate action should be taken.   Original Report Authenticated By: Rolla Plate, M.D.    [name] Core Biopsy  06/05/2012  *RADIOLOGY REPORT*  Clinical Data:  Palpable mass at 12 o'clock 4 cm from  the left nipple.  ULTRASOUND GUIDED VACUUM ASSISTED CORE BIOPSY OF THE LEFT BREAST  The patient and I discussed the procedure of ultrasound-guided biopsy, including benefits and alternatives.  We discussed the high likelihood of a successful procedure. We discussed the risks of the procedure including infection, bleeding, tissue injury, clip migration, and inadequate sampling.  Informed written consent was given.  Using sterile technique, 2% lidocaine, 2% lidocaine with epinephrine, ultrasound guidance, and a 12 gauge vacuum assisted needle, biopsy was performed of the mass at 12 o'clock 4 cm from the left nipple using a lateromedial approach.  At the conclusion of the procedure, a ribbon tissue marker clip was deployed into the biopsy cavity.  Follow-up 2-view mammogram was performed and dictated separately.  IMPRESSION: Ultrasound-guided biopsy of the mass at 12 o'clock 4 cm from the left nipple.  No apparent complications.   Original Report Authenticated By: Cain Saupe, M.D.    Mm Digital Diagnostic Bilat  06/05/2012  *RADIOLOGY REPORT*  Clinical Data:  The patient feels a mass in the upper portion of the left breast.   She had a benign excisional biopsy of the right breast in 2006.  She had a left upper lobectomy due to coccidioidomycosis in 1998.  DIGITAL DIAGNOSTIC BILATERAL MAMMOGRAM WITH CAD AND LEFT BREAST ULTRASOUND:   Comparison:  None.  Findings:  The breast tissue is heterogeneously dense.  There is an irregular mass with suspicious microcalcifications in the upper portion of the left breast.  No abnormality is noted on the right. Mammographic images were processed with CAD.  On physical exam, I palpate a 3 cm firm mass at 12 o'clock 4 cm from the left nipple.  Ultrasound is performed, showing an irregular hypoechoic mass with microcalcifications at 12 o'clock 4 cm from the left nipple measuring 2.5 x 2.6 x 1.5 cm.  Sonography of the left axilla demonstrates a prominent but not definitely abnormal axillary lymph node.  The appearance is highly suspicious for invasive mammary carcinoma. Biopsy is recommended.  Ultrasound-guided core needle biopsy was discussed with the patient and she agreed with this plan.  IMPRESSION: Highly suspicious mass at 12 o'clock 4 cm from the left nipple measuring 2.5 x 2.6 x 1.5 cm.  RECOMMENDATION: Ultrasound-guided core needle biopsy is recommended.  This will be performed and reported separately.  I have discussed the findings and recommendations with the patient. Results were also provided in writing at the conclusion of the visit.  BI-RADS CATEGORY 5:  Highly suggestive of malignancy - appropriate action should be taken.   Original Report Authenticated By: Cain Saupe, M.D.    Mm Digital Diagnostic Unilat L  06/05/2012  *RADIOLOGY REPORT*  Clinical Data:  Ultrasound-guided core needle biopsy of a mass at 12 o'clock 4 cm from the left nipple with clip placement.  DIGITAL DIAGNOSTIC LEFT MAMMOGRAM  Comparison:  None.  Findings:  Films are performed following ultrasound guided biopsy of a mass at 12 o'clock 4 cm from the left nipple.  The ribbon clip is appropriately positioned.  IMPRESSION: Appropriate clip placement following ultrasound-guided core needle biopsy of a mass at 12 o'clock 4 cm from the left nipple.   Original Report Authenticated By: Cain Saupe, M.D.    Mm Radiologist  Eval And Mgmt  06/06/2012  *RADIOLOGY REPORT*  ESTABLISHED PATIENT OFFICE VISIT - LEVEL II [id number])  Chief Complaint:  The patient presented with a palpable left breast mass.  History:  Ultrasound guided core biopsy of the left breast mass was performed on 06/05/2012.  Exam:  The wound site is clean and dry with no signs of inflammation or hematoma.  Results of the biopsy were discussed with the patient.  Pathology: Invasive ductal carcinoma was reported histologically. This corresponds well with the imaging findings.  Assessment and Plan:  The patient will be seen at the Multidisciplinary Clinic on 06/13/2012.  Breast MRI has been scheduled.  Informational reading material was provided to the patient.   Original Report Authenticated By: Baird Lyons, M.D.     ASSESSMENT: 47 year old female with  #1 stage II a (3.1 cm) invasive ductal carcinoma of the left breast status post needle core biopsy that showed ER positive PR positive HER-2/neu positive breast cancer with a Ki-67 at 70%. Patient is now going to receive neoadjuvant chemotherapy and she desires breast conservation. She was originally seen in the multidisciplinary breast clinic.  #2 she will begin her chemotherapy on 07/13/2011 after she has her Port-A-Cath placed next week she has had her chemotherapy teaching class as well as echocardiogram. She has also been seen by the cardiology. Echo 12/16 EF 55%.   #3 Reflux  #4 Otitis media/lymphadenitis  PLAN:   #1 Ms. Mignogna is doing well today, will proceed with Herceptin today. Her rib pain has resolved.   #2  Her reflux has resolved with the Omeprazole.  #3 She will return next week for labs and weekly Herceptin.   #4 I prescribed Cefdinir for her lymphadenitis and left acute otitis media.   All questions were answered. The patient knows to call the clinic with any problems, questions or concerns. We can certainly see the patient much sooner if necessary.  I spent 25 minutes counseling  the patient face to face. The total time spent in the appointment was 30 minutes.  Cherie Ouch Lyn Hollingshead, NP Medical Oncology [facility] Phone: [phone]    08/16/2012, 1:59 PM

## 2012-08-16 NOTE — Patient Instructions (Signed)
Call MD for problems 

## 2012-08-16 NOTE — Patient Instructions (Addendum)
Doing well.  I have called in medication for your ear and tenderness in your neck.  Please call us if you have any questions or concerns.

## 2012-08-16 NOTE — Progress Notes (Signed)
Patient drove herself today.   Questioned patient regarding the Melissa [name] 50mg  that she receives.  Patient states that she never has a problem.  Cautioned patient to use extreme caution when driving after treatment.

## 2012-08-19 ENCOUNTER — Other Ambulatory Visit: Payer: Self-pay | Admitting: Oncology

## 2012-08-22 ENCOUNTER — Telehealth: Payer: Self-pay | Admitting: *Deleted

## 2012-08-22 NOTE — Telephone Encounter (Signed)
Patient called and wondering about the schedule for tomorrow. I have explained that we will be open tomorrow, to just call if she needs to be late or cant come. JMW

## 2012-08-23 ENCOUNTER — Telehealth: Payer: Self-pay | Admitting: Genetic Counselor

## 2012-08-23 ENCOUNTER — Other Ambulatory Visit (HOSPITAL_BASED_OUTPATIENT_CLINIC_OR_DEPARTMENT_OTHER): Payer: BC Managed Care – PPO | Admitting: Lab

## 2012-08-23 ENCOUNTER — Ambulatory Visit (HOSPITAL_BASED_OUTPATIENT_CLINIC_OR_DEPARTMENT_OTHER): Payer: BC Managed Care – PPO

## 2012-08-23 ENCOUNTER — Ambulatory Visit (HOSPITAL_BASED_OUTPATIENT_CLINIC_OR_DEPARTMENT_OTHER): Payer: BC Managed Care – PPO | Admitting: Adult Health

## 2012-08-23 VITALS — BP 109/72 | HR 78 | Temp 98.3°F | Resp 18 | Wt 120.2 lb

## 2012-08-23 DIAGNOSIS — Z5111 Encounter for antineoplastic chemotherapy: Secondary | ICD-10-CM

## 2012-08-23 DIAGNOSIS — C50919 Malignant neoplasm of unspecified site of unspecified female breast: Secondary | ICD-10-CM

## 2012-08-23 DIAGNOSIS — Z170 Estrogen receptor positive status [ER+]: Secondary | ICD-10-CM

## 2012-08-23 DIAGNOSIS — C50219 Malignant neoplasm of upper-inner quadrant of unspecified female breast: Secondary | ICD-10-CM

## 2012-08-23 DIAGNOSIS — Z5112 Encounter for antineoplastic immunotherapy: Secondary | ICD-10-CM

## 2012-08-23 DIAGNOSIS — K219 Gastro-esophageal reflux disease without esophagitis: Secondary | ICD-10-CM

## 2012-08-23 DIAGNOSIS — I889 Nonspecific lymphadenitis, unspecified: Secondary | ICD-10-CM

## 2012-08-23 LAB — COMPREHENSIVE METABOLIC PANEL (CC13)
ALT: 18 U/L (ref 0–55)
Albumin: 3.5 g/dL (ref 3.5–5.0)
Alkaline Phosphatase: 86 U/L (ref 40–150)
CO2: 25 mEq/L (ref 22–29)
Glucose: 93 mg/dl (ref 70–99)
Potassium: 4.1 mEq/L (ref 3.5–5.1)
Sodium: 141 mEq/L (ref 136–145)
Total Protein: 7.5 g/dL (ref 6.4–8.3)

## 2012-08-23 LAB — CBC WITH DIFFERENTIAL/PLATELET
BASO%: 0.1 % (ref 0.0–2.0)
Eosinophils Absolute: 0 10*3/uL (ref 0.0–0.5)
MCHC: 33.7 g/dL (ref 31.5–36.0)
MONO#: 0.5 10*3/uL (ref 0.1–0.9)
MONO%: 6.1 % (ref 0.0–14.0)
NEUT#: 6.7 10*3/uL — ABNORMAL HIGH (ref 1.5–6.5)
RBC: 3.14 10*6/uL — ABNORMAL LOW (ref 3.70–5.45)
RDW: 17.5 % — ABNORMAL HIGH (ref 11.2–14.5)
WBC: 8.7 10*3/uL (ref 3.9–10.3)

## 2012-08-23 MED ORDER — DOCETAXEL CHEMO INJECTION 160 MG/16ML
75.0000 mg/m2 | Freq: Once | INTRAVENOUS | Status: AC
  Administered 2012-08-23: 110 mg via INTRAVENOUS
  Filled 2012-08-23: qty 11

## 2012-08-23 MED ORDER — SODIUM CHLORIDE 0.9 % IV SOLN
603.0000 mg | Freq: Once | INTRAVENOUS | Status: AC
  Administered 2012-08-23: 600 mg via INTRAVENOUS
  Filled 2012-08-23: qty 60

## 2012-08-23 MED ORDER — HEPARIN SOD (PORK) LOCK FLUSH 100 UNIT/ML IV SOLN
500.0000 [IU] | Freq: Once | INTRAVENOUS | Status: AC | PRN
  Administered 2012-08-23: 500 [IU]
  Filled 2012-08-23: qty 5

## 2012-08-23 MED ORDER — SODIUM CHLORIDE 0.9 % IJ SOLN
10.0000 mL | INTRAMUSCULAR | Status: DC | PRN
  Administered 2012-08-23: 10 mL
  Filled 2012-08-23: qty 10

## 2012-08-23 MED ORDER — DEXAMETHASONE SODIUM PHOSPHATE 4 MG/ML IJ SOLN
20.0000 mg | Freq: Once | INTRAMUSCULAR | Status: AC
  Administered 2012-08-23: 20 mg via INTRAVENOUS

## 2012-08-23 MED ORDER — TRASTUZUMAB CHEMO INJECTION 440 MG
2.0000 mg/kg | Freq: Once | INTRAVENOUS | Status: AC
  Administered 2012-08-23: 105 mg via INTRAVENOUS
  Filled 2012-08-23: qty 5

## 2012-08-23 MED ORDER — ACETAMINOPHEN 325 MG PO TABS
650.0000 mg | ORAL_TABLET | Freq: Once | ORAL | Status: AC
  Administered 2012-08-23: 650 mg via ORAL

## 2012-08-23 MED ORDER — ONDANSETRON 16 MG/50ML IVPB (CHCC)
16.0000 mg | Freq: Once | INTRAVENOUS | Status: AC
  Administered 2012-08-23: 16 mg via INTRAVENOUS

## 2012-08-23 MED ORDER — DIPHENHYDRAMINE HCL 25 MG PO CAPS
50.0000 mg | ORAL_CAPSULE | Freq: Once | ORAL | Status: AC
  Administered 2012-08-23: 50 mg via ORAL

## 2012-08-23 NOTE — Patient Instructions (Addendum)
[facility] Discharge Instructions for Patients Receiving Chemotherapy  Today you received the following chemotherapy agents: Taxotere, Herceptin, Carboplatin. To help prevent nausea and vomiting after your treatment, we encourage you to take your nausea medication.    If you develop nausea and vomiting that is not controlled by your nausea medication, call the clinic. If it is after clinic hours your family physician or the after hours number for the clinic or go to the Emergency Department.   BELOW ARE SYMPTOMS THAT SHOULD BE REPORTED IMMEDIATELY:  *FEVER GREATER THAN 100.5 F  *CHILLS WITH OR WITHOUT FEVER  NAUSEA AND VOMITING THAT IS NOT CONTROLLED WITH YOUR NAUSEA MEDICATION  *UNUSUAL SHORTNESS OF BREATH  *UNUSUAL BRUISING OR BLEEDING  TENDERNESS IN MOUTH AND THROAT WITH OR WITHOUT PRESENCE OF ULCERS  *URINARY PROBLEMS  *BOWEL PROBLEMS  UNUSUAL RASH Items with * indicate a potential emergency and should be followed up as soon as possible.  Please let the nurse know about any problems that you may have experienced. Feel free to call the clinic you have any questions or concerns. The clinic phone number is [phone].

## 2012-08-23 NOTE — Telephone Encounter (Signed)
Left good news message and asked her to CB.

## 2012-08-23 NOTE — Progress Notes (Signed)
OFFICE PROGRESS NOTE  CC  PCP: [name], MD   [name], MD 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  PRIOR THERAPY:  #1 patient was originally seen in the multidisciplinary breast clinic when she found a left breast mass she eventually had MRI and mammograms performed. She was diagnosed with invasive ductal carcinoma that was ER positive PR positive HER-2/neu positive measuring 3.1 cm by MRI criteria. Ki-67 was 70% HER-2 was amplified with a ratio 2.91.  #2 patient was seen at the multidisciplinary breast clinic and a recommendation for neoadjuvant chemotherapy was recommended as patient was interested in breast conservation. She will receive neoadjuvant Taxotere carboplatinum Herceptin. Taxotere carboplatinum will be given every 21 days with weekly Herceptin. Total of 6 cycles of Taxotere and carboplatinum will be administered.  Her subsequent history is as detailed below  CURRENT THERAPY: Cycle 3 Day 1 TCH with Neulasta support, weekly Herceptin  INTERVAL HISTORY: Melissa [name] returns today with her daughter Morrie Sheldon for followup of her breast cancer. She is tolerating her treatment well, with no significant problems with nausea or vomiting. She has gone back to work 3 days a week. She does get a rash after the day 1 treatments. After cycle 1 this was on the dorsum of both hands. After cycle 2 he was behind both knees. This lasted a few days and she treated it with topical ointments. More recently she had some upper respiratory symptoms and was given a prescription for Cefdinir. This caused of bilateral leg swelling, she feels, and she stopped the medication. The swelling resolved within 48 hours. She is having no peripheral neuropathy symptoms. She has had no mucositis. A detailed review of systems  today is otherwise noncontributory.  MEDICAL HISTORY: Past Medical History  Diagnosis Date  . Breast cancer   . Headache   . Back pain   . 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 [place]     ALLERGIES:  is allergic to aspirin and iodinated diagnostic agents.  MEDICATIONS:  Current Outpatient Prescriptions  Medication Sig Dispense Refill  . albuterol (VENTOLIN HFA) 108 (90 BASE) MCG/ACT inhaler Inhale 2 puffs into the lungs every 6 (six) hours as needed.        . Alum & Mag Hydroxide-Simeth (MAGIC MOUTHWASH) SOLN Take 15 mLs by mouth 3 (three) times daily as needed.  240 mL  3  . amoxicillin (AMOXIL) 500 MG capsule Take 1 capsule (500 mg total) by mouth 3 (three) times daily.  30 capsule  11  . cefdinir (OMNICEF) 300 MG capsule Take 1 capsule (300 mg total) by mouth 2 (two) times daily.  20 capsule  0  . dexamethasone (DECADRON) 4 MG tablet Take 2 tablets (8 mg total) by mouth 2 (two) times daily with a meal. Take two times a day the day before Taxotere. Then take two times a day starting the day after chemo for 3 days.  30 tablet  4  . fluconazole (DIFLUCAN) 200 MG tablet       . lidocaine-prilocaine (EMLA) cream Apply topically as needed.  30 g  7  . LORazepam (ATIVAN) 0.5 MG tablet TAKE 1 TABLET BY MOUTH EVERY 6 HOURS AS NEEDED FOR NAUSEA/VOMITING  30 tablet  0  . naproxen sodium (ANAPROX) 220 MG tablet  Take 220 mg by mouth 2 (two) times daily with a meal.      . omeprazole (PRILOSEC) 40 MG capsule Take 1 capsule (40 mg total) by mouth daily.  30 capsule  2  . ondansetron (ZOFRAN) 8 MG tablet Take 1 tablet (8 mg total) by mouth 2 (two) times daily. Take two times a day starting the day after chemo for 3 days. Then take two times a day as needed for nausea or vomiting.  30 tablet  1  . prochlorperazine (COMPAZINE) 10 MG tablet Take 1 tablet (10 mg total) by mouth every 6 (six) hours as needed (Nausea or vomiting).  30 tablet  1   . valACYclovir (VALTREX) 500 MG tablet        No current facility-administered medications for this visit.    SURGICAL HISTORY:  Past Surgical History  Procedure Laterality Date  . 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 Cesarean section  1995/1996  . Lobectomy  1997    upper left  . Portacath placement  07/02/2012    Procedure: INSERTION PORT-A-CATH;  Surgeon: Mariella Saa, MD;  Location: [facility];  Service: General;  Laterality: N/A;  right    REVIEW OF SYSTEMS:  See interval history above  PHYSICAL EXAMINATION: Blood pressure 109/72, pulse 78, temperature 98.3 F (36.8 C), temperature source Oral, resp. rate 18, weight 120 lb 3.2 oz (54.522 kg). Body mass index is 21.81 kg/(m^2).  Sclerae unicteric Oropharynx clear No cervical or supraclavicular adenopathy Lungs no rales or rhonchi Heart regular rate and rhythm Abd benign MSK no focal spinal tenderness, no peripheral edema Neuro: nonfocal Breasts: The right breast is unremarkable. In the left breast in the superior lateral aspect up of the areola there is still a palpable mass measuring perhaps 3 cm. It is relatively soft. There is no erythema or other skin change and no nipple retraction. The left axilla was clear  ECOG PERFORMANCE STATUS: 1  LABORATORY DATA: Lab Results  Component Value Date   WBC 8.7 08/23/2012   HGB 10.2* 08/23/2012   HCT 30.3* 08/23/2012   MCV 96.5 08/23/2012   PLT 275 08/23/2012      Chemistry      Component Value Date/Time   NA 138 08/16/2012 0926   NA 141 01/26/2011 1039   K 3.6 08/16/2012 0926   K 4.5 01/26/2011 1039   CL 104 08/16/2012 0926   CL 106 01/26/2011 1039   CO2 26 08/16/2012 0926   CO2 28 01/26/2011 1039   BUN 9.3 08/16/2012 0926   BUN 7 01/26/2011 1039   CREATININE 0.7 08/16/2012 0926   CREATININE 0.6 01/26/2011 1039      Component Value Date/Time   CALCIUM 9.0 08/16/2012 0926   CALCIUM 9.2 01/26/2011 1039   ALKPHOS 91 08/16/2012 0926   AST  11 08/16/2012 0926   ALT 16 08/16/2012 0926   BILITOT 0.35 08/16/2012 0926       RADIOGRAPHIC STUDIES: Dg Chest 2 View  07/30/2012  *RADIOLOGY REPORT*  Clinical Data: Bronchiectasis.  History of breast cancer.  CHEST - 2 VIEW  Comparison: 07/02/2012 and 05/03/2011  Findings: Power port in place.  There is now an ill-defined 16 mm in density in the left lung base which correlates with the previous cystic lesion seen on CT scan of 01/26/2011.  There is central lucency.  I suspect this represents retraction and wall thickening of the previously seen area of cystic bronchiectasis.  Scarring at the left apex medially due to cystic bronchiectasis appears slightly less prominent.  The right lung is clear.  Heart size is normal.  Previous left upper lobectomy.  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  No other change since the prior exam.   Original Report Authenticated By: Francene Boyers, M.D.      ASSESSMENT: 47 year old [place] woman  with  #1 a clinical stage IIA (3.1 cm) invasive ductal carcinoma of the left breast status post needle core biopsy 06/05/2012 that showed ER positive PR positive HER-2/neu positive breast cancer with a Ki-67 at 70%.   #2 started chemotherapy on 07/13/2011 consisting of carboplatin, docetaxel and trastuzumab every 3 weeks, with the trastuzumab given weekly  #3 trastuzumab to be continued to complete a year;  most recent Echo 06/25/2012   #4 Reflux  #5 Otitis media/lymphadenitis--intolerance of Cefnivir  PLAN:  She is starting her third cycle today and she is tolerating treatment well. Per prior descriptions, she appears to be having a moderately good response to treatment. If the mass is still palpable after 3 cycles, possibly pertuzumab could be added to intensify therapy. I am making no changes in her treatment. She already has her followup appointment for next week. She knows to call for any problems  that may develop before the next visit.   08/23/2012, 9:51 AM

## 2012-08-24 ENCOUNTER — Ambulatory Visit (HOSPITAL_BASED_OUTPATIENT_CLINIC_OR_DEPARTMENT_OTHER): Payer: BC Managed Care – PPO

## 2012-08-24 ENCOUNTER — Other Ambulatory Visit: Payer: Self-pay | Admitting: Certified Registered Nurse Anesthetist

## 2012-08-24 VITALS — BP 110/58 | HR 86 | Temp 98.4°F

## 2012-08-24 DIAGNOSIS — Z5189 Encounter for other specified aftercare: Secondary | ICD-10-CM

## 2012-08-24 DIAGNOSIS — C50219 Malignant neoplasm of upper-inner quadrant of unspecified female breast: Secondary | ICD-10-CM

## 2012-08-24 DIAGNOSIS — C50919 Malignant neoplasm of unspecified site of unspecified female breast: Secondary | ICD-10-CM

## 2012-08-24 MED ORDER — PEGFILGRASTIM INJECTION 6 MG/0.6ML
6.0000 mg | Freq: Once | SUBCUTANEOUS | Status: AC
  Administered 2012-08-24: 6 mg via SUBCUTANEOUS
  Filled 2012-08-24: qty 0.6

## 2012-08-27 ENCOUNTER — Telehealth: Payer: Self-pay | Admitting: Genetic Counselor

## 2012-08-27 NOTE — Telephone Encounter (Signed)
Revealed negative genetic test results 

## 2012-08-30 ENCOUNTER — Ambulatory Visit (HOSPITAL_BASED_OUTPATIENT_CLINIC_OR_DEPARTMENT_OTHER): Payer: BC Managed Care – PPO | Admitting: Adult Health

## 2012-08-30 ENCOUNTER — Other Ambulatory Visit (HOSPITAL_BASED_OUTPATIENT_CLINIC_OR_DEPARTMENT_OTHER): Payer: BC Managed Care – PPO | Admitting: Lab

## 2012-08-30 ENCOUNTER — Ambulatory Visit (HOSPITAL_BASED_OUTPATIENT_CLINIC_OR_DEPARTMENT_OTHER): Payer: BC Managed Care – PPO

## 2012-08-30 ENCOUNTER — Telehealth: Payer: Self-pay | Admitting: Oncology

## 2012-08-30 ENCOUNTER — Encounter: Payer: Self-pay | Admitting: Adult Health

## 2012-08-30 ENCOUNTER — Telehealth: Payer: Self-pay | Admitting: *Deleted

## 2012-08-30 VITALS — BP 115/74 | HR 94 | Temp 98.5°F | Resp 20 | Ht 62.25 in | Wt 118.0 lb

## 2012-08-30 DIAGNOSIS — Z5112 Encounter for antineoplastic immunotherapy: Secondary | ICD-10-CM

## 2012-08-30 DIAGNOSIS — C50919 Malignant neoplasm of unspecified site of unspecified female breast: Secondary | ICD-10-CM

## 2012-08-30 DIAGNOSIS — Z170 Estrogen receptor positive status [ER+]: Secondary | ICD-10-CM

## 2012-08-30 DIAGNOSIS — C50219 Malignant neoplasm of upper-inner quadrant of unspecified female breast: Secondary | ICD-10-CM

## 2012-08-30 LAB — CBC WITH DIFFERENTIAL/PLATELET
BASO%: 0.4 % (ref 0.0–2.0)
Eosinophils Absolute: 0.1 10*3/uL (ref 0.0–0.5)
HCT: 30.1 % — ABNORMAL LOW (ref 34.8–46.6)
LYMPH%: 20.5 % (ref 14.0–49.7)
MONO#: 4.3 10*3/uL — ABNORMAL HIGH (ref 0.1–0.9)
NEUT#: 9.5 10*3/uL — ABNORMAL HIGH (ref 1.5–6.5)
NEUT%: 54.4 % (ref 38.4–76.8)
Platelets: 344 10*3/uL (ref 145–400)
RBC: 3.14 10*6/uL — ABNORMAL LOW (ref 3.70–5.45)
WBC: 17.5 10*3/uL — ABNORMAL HIGH (ref 3.9–10.3)
lymph#: 3.6 10*3/uL — ABNORMAL HIGH (ref 0.9–3.3)
nRBC: 1 % — ABNORMAL HIGH (ref 0–0)

## 2012-08-30 LAB — COMPREHENSIVE METABOLIC PANEL (CC13)
ALT: 16 U/L (ref 0–55)
AST: 13 U/L (ref 5–34)
Albumin: 3.6 g/dL (ref 3.5–5.0)
CO2: 26 mEq/L (ref 22–29)
Calcium: 9.7 mg/dL (ref 8.4–10.4)
Chloride: 100 mEq/L (ref 98–107)
Creatinine: 0.8 mg/dL (ref 0.6–1.1)
Potassium: 3.6 mEq/L (ref 3.5–5.1)

## 2012-08-30 MED ORDER — LORAZEPAM 0.5 MG PO TABS: ORAL_TABLET | ORAL | Status: DC

## 2012-08-30 MED ORDER — SODIUM CHLORIDE 0.9 % IV SOLN
Freq: Once | INTRAVENOUS | Status: AC
  Administered 2012-08-30: 11:00:00 via INTRAVENOUS

## 2012-08-30 MED ORDER — SODIUM CHLORIDE 0.9 % IJ SOLN
10.0000 mL | INTRAMUSCULAR | Status: DC | PRN
  Administered 2012-08-30: 10 mL
  Filled 2012-08-30: qty 10

## 2012-08-30 MED ORDER — ACETAMINOPHEN 325 MG PO TABS
650.0000 mg | ORAL_TABLET | Freq: Once | ORAL | Status: AC
  Administered 2012-08-30: 650 mg via ORAL

## 2012-08-30 MED ORDER — DIPHENHYDRAMINE HCL 25 MG PO CAPS
50.0000 mg | ORAL_CAPSULE | Freq: Once | ORAL | Status: AC
  Administered 2012-08-30: 50 mg via ORAL

## 2012-08-30 MED ORDER — TRASTUZUMAB CHEMO INJECTION 440 MG
2.0000 mg/kg | Freq: Once | INTRAVENOUS | Status: AC
  Administered 2012-08-30: 105 mg via INTRAVENOUS
  Filled 2012-08-30: qty 5

## 2012-08-30 MED ORDER — HEPARIN SOD (PORK) LOCK FLUSH 100 UNIT/ML IV SOLN
500.0000 [IU] | Freq: Once | INTRAVENOUS | Status: AC | PRN
  Administered 2012-08-30: 500 [IU]
  Filled 2012-08-30: qty 5

## 2012-08-30 NOTE — Progress Notes (Signed)
OFFICE PROGRESS NOTE  CC  PCP: [name], MD   [name], MD [address] [place] [address]  DIAGNOSIS: 47 year old female with premenopausal ER positive PR positive HER-2/neu positive invasive ductal carcinoma of the left breast clinical stage IIA diagnosed in November 2013.  PRIOR THERAPY:  #1 patient was originally seen in the multidisciplinary breast clinic when she found a left breast mass she eventually had MRI and mammograms performed. She was diagnosed with invasive ductal carcinoma that was ER positive PR positive HER-2/neu positive measuring 3.1 cm by MRI criteria. Ki-67 was 70% HER-2 was amplified with a ratio 2.91.  #2 patient was seen at the multidisciplinary breast clinic and a recommendation for neoadjuvant chemotherapy was recommended as patient was interested in breast conservation. She will receive neoadjuvant Taxotere carboplatinum Herceptin. Taxotere carboplatinum will be given every 21 days with weekly Herceptin. Total of 6 cycles of Taxotere and carboplatinum will be administered.  Her subsequent history is as detailed below  CURRENT THERAPY: Cycle 3 Day 8 TCH with Neulasta support, weekly Herceptin  INTERVAL HISTORY: Ms. Tartaglia is doing well today.  She is suffering from taste changes and is having diarrhea about two times a day.  Otherwise, she's doing well, and denies fevers, chills, nausea, vomiting or any other concerns.  A 10 point ROS is otherwise neg.   MEDICAL HISTORY: Past Medical History  Diagnosis Date  . Breast cancer   . Headache   . Back pain   . 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 [place] fever     ALLERGIES:  is allergic to aspirin and iodinated diagnostic agents.  MEDICATIONS:  Current Outpatient Prescriptions  Medication Sig Dispense Refill  . albuterol (VENTOLIN HFA) 108 (90 BASE) MCG/ACT inhaler Inhale 2 puffs into the lungs every 6 (six) hours as needed.         . Alum & Mag Hydroxide-Simeth (MAGIC MOUTHWASH) SOLN Take 15 mLs by mouth 3 (three) times daily as needed.  240 mL  3  . amoxicillin (AMOXIL) 500 MG capsule Take 1 capsule (500 mg total) by mouth 3 (three) times daily.  30 capsule  11  . dexamethasone (DECADRON) 4 MG tablet Take 2 tablets (8 mg total) by mouth 2 (two) times daily with a meal. Take two times a day the day before Taxotere. Then take two times a day starting the day after chemo for 3 days.  30 tablet  4  . lidocaine-prilocaine (EMLA) cream Apply topically as needed.  30 g  7  . LORazepam (ATIVAN) 0.5 MG tablet TAKE 1 TABLET BY MOUTH EVERY 6 HOURS AS NEEDED FOR NAUSEA/VOMITING  30 tablet  0  . naproxen sodium (ANAPROX) 220 MG tablet Take 220 mg by mouth 2 (two) times daily with a meal.      . omeprazole (PRILOSEC) 40 MG capsule Take 1 capsule (40 mg total) by mouth daily.  30 capsule  2  . ondansetron (ZOFRAN) 8 MG tablet Take 1 tablet (8 mg total) by mouth 2 (two) times daily. Take two times a day starting the day after chemo for 3 days. Then take two times a day as needed for nausea or vomiting.  30 tablet  1  . prochlorperazine (COMPAZINE) 10 MG tablet Take 1 tablet (10 mg total) by mouth every 6 (six) hours as needed (Nausea or vomiting).  30 tablet  1  . valACYclovir (VALTREX) 500 MG tablet  No current facility-administered medications for this visit.   Facility-Administered Medications Ordered in Other Visits  Medication Dose Route Frequency Provider Last Rate Last Dose  . sodium chloride 0.9 % injection 10 mL  10 mL Intracatheter PRN Victorino December, MD   10 mL at 08/30/12 1236    SURGICAL HISTORY:  Past Surgical History  Procedure Laterality Date  . 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 Cesarean section  1995/1996  . Lobectomy  1997    upper left  . Portacath placement  07/02/2012    Procedure: INSERTION PORT-A-CATH;  Surgeon: Mariella Saa, MD;  Location: [facility];  Service: General;  Laterality: N/A;  right    REVIEW OF SYSTEMS:   General: fatigue (-), night sweats (-), fever (-), pain (-) Lymph: palpable nodes (-) HEENT: vision changes (-), mucositis (-), gum bleeding (-), epistaxis (-) Cardiovascular: chest pain (-), palpitations (-) Pulmonary: shortness of breath (-), dyspnea on exertion (-), cough (-), hemoptysis (-) GI:  Early satiety (-), melena (-), dysphagia (-), nausea/vomiting (-), diarrhea (-) GU: dysuria (-), hematuria (-), incontinence (-) Musculoskeletal: joint swelling (-), joint pain (-), back pain (-) Neuro: weakness (-), numbness (-), headache (-), confusion (-) Skin: Rash (-), lesions (-), dryness (-) Psych: depression (-), suicidal/homicidal ideation (-), feeling of hopelessness (-)   PHYSICAL EXAMINATION: Blood pressure 115/74, pulse 94, temperature 98.5 F (36.9 C), temperature source Oral, resp. rate 20, height 5' 2.25" (1.581 m), weight 118 lb (53.524 kg). Body mass index is 21.41 kg/(m^2). General: Patient is a well appearing female in no acute distress HEENT: PERRLA, sclerae anicteric no conjunctival pallor, MMM Neck: supple, no palpable adenopathy Lungs: clear to auscultation bilaterally, no wheezes, rhonchi, or rales Cardiovascular: regular rate rhythm, S1, S2, no murmurs, rubs or gallops Abdomen: Soft, non-tender, non-distended, normoactive bowel sounds, no HSM Extremities: warm and well perfused, no clubbing, cyanosis, or edema Skin: No rashes or lesions Neuro: Non-focal Breasts: The right breast is unremarkable. In the left breast in the superior lateral aspect up of the areola there is still a palpable mass measuring perhaps 3 cm. It is relatively soft. There is no erythema or other skin change and no nipple retraction. The left axilla was clear ECOG PERFORMANCE STATUS: 1  LABORATORY DATA: Lab Results  Component Value Date   WBC 17.5* 08/30/2012   HGB 10.3* 08/30/2012   HCT 30.1* 08/30/2012   MCV 95.9  08/30/2012   PLT 344 08/30/2012      Chemistry      Component Value Date/Time   NA 137 08/30/2012 0924   NA 141 01/26/2011 1039   K 3.6 08/30/2012 0924   K 4.5 01/26/2011 1039   CL 100 08/30/2012 0924   CL 106 01/26/2011 1039   CO2 26 08/30/2012 0924   CO2 28 01/26/2011 1039   BUN 9.2 08/30/2012 0924   BUN 7 01/26/2011 1039   CREATININE 0.8 08/30/2012 0924   CREATININE 0.6 01/26/2011 1039      Component Value Date/Time   CALCIUM 9.7 08/30/2012 0924   CALCIUM 9.2 01/26/2011 1039   ALKPHOS 95 08/30/2012 0924   AST 13 08/30/2012 0924   ALT 16 08/30/2012 0924   BILITOT 0.24 08/30/2012 0924       RADIOGRAPHIC STUDIES: Dg Chest 2 View  07/30/2012  *RADIOLOGY REPORT*  Clinical Data: Bronchiectasis.  History of breast cancer.  CHEST - 2 VIEW  Comparison: 07/02/2012 and 05/03/2011  Findings: Power  port in place.  There is now an ill-defined 16 mm in density in the left lung base which correlates with the previous cystic lesion seen on CT scan of 01/26/2011.  There is central lucency.  I suspect this represents retraction and wall thickening of the previously seen area of cystic bronchiectasis.  Scarring at the left apex medially due to cystic bronchiectasis appears slightly less prominent.  The right lung is clear.  Heart size is normal.  Previous left upper lobectomy.  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  No other change since the prior exam.   Original Report Authenticated By: Francene Boyers, M.D.      ASSESSMENT: 47 year old [place] woman  with  #1 a clinical stage IIA (3.1 cm) invasive ductal carcinoma of the left breast status post needle core biopsy 06/05/2012 that showed ER positive PR positive HER-2/neu positive breast cancer with a Ki-67 at 70%.   #2 started chemotherapy on 07/13/2011 consisting of carboplatin, docetaxel and trastuzumab every 3 weeks, with the trastuzumab given weekly  #3 trastuzumab to be continued to  complete a year;  most recent Echo 06/25/2012   #4 Reflux  PLAN:   1. Ms. hocutt is feeling well today.  She will proceed with Herceptin.    2. I will see her back next week for her next Herceptin treatment.    All questions were answered. The patient knows to call the clinic with any problems, questions or concerns. We can certainly see the patient much sooner if necessary.   I spent 25 minutes counseling the patient face to face. The total time spent in the appointment was 30 minutes.  Cherie Ouch Lyn Hollingshead, NP Medical Oncology [facility] Phone: [phone] 08/30/2012, 2:30 PM

## 2012-08-30 NOTE — Patient Instructions (Signed)
Patient aware of next appointment; discharged home with no complaints. 

## 2012-08-30 NOTE — Telephone Encounter (Signed)
Per staff phone call and POF I have schedueld appts.  JMW  

## 2012-08-30 NOTE — Patient Instructions (Addendum)
Doing well.  Proceed with Herceptin today.  Please try to drink at least 4 bottles of water per day.

## 2012-08-31 ENCOUNTER — Other Ambulatory Visit: Payer: Self-pay | Admitting: Certified Registered Nurse Anesthetist

## 2012-09-04 ENCOUNTER — Encounter: Payer: Self-pay | Admitting: *Deleted

## 2012-09-04 ENCOUNTER — Telehealth: Payer: Self-pay | Admitting: *Deleted

## 2012-09-04 NOTE — Telephone Encounter (Signed)
Per staff message and POF I have scheduled appts.  JMW  

## 2012-09-06 ENCOUNTER — Encounter: Payer: Self-pay | Admitting: Adult Health

## 2012-09-06 ENCOUNTER — Ambulatory Visit (HOSPITAL_BASED_OUTPATIENT_CLINIC_OR_DEPARTMENT_OTHER): Payer: BC Managed Care – PPO | Admitting: Adult Health

## 2012-09-06 ENCOUNTER — Ambulatory Visit (HOSPITAL_BASED_OUTPATIENT_CLINIC_OR_DEPARTMENT_OTHER): Payer: BC Managed Care – PPO

## 2012-09-06 ENCOUNTER — Other Ambulatory Visit (HOSPITAL_BASED_OUTPATIENT_CLINIC_OR_DEPARTMENT_OTHER): Payer: BC Managed Care – PPO | Admitting: Lab

## 2012-09-06 VITALS — BP 121/82 | HR 88 | Temp 97.9°F | Resp 20 | Ht 62.5 in | Wt 119.7 lb

## 2012-09-06 DIAGNOSIS — C50212 Malignant neoplasm of upper-inner quadrant of left female breast: Secondary | ICD-10-CM

## 2012-09-06 DIAGNOSIS — Z5112 Encounter for antineoplastic immunotherapy: Secondary | ICD-10-CM

## 2012-09-06 DIAGNOSIS — C50919 Malignant neoplasm of unspecified site of unspecified female breast: Secondary | ICD-10-CM

## 2012-09-06 DIAGNOSIS — Z170 Estrogen receptor positive status [ER+]: Secondary | ICD-10-CM

## 2012-09-06 LAB — COMPREHENSIVE METABOLIC PANEL (CC13)
ALT: 20 U/L (ref 0–55)
AST: 18 U/L (ref 5–34)
CO2: 27 mEq/L (ref 22–29)
Calcium: 9.6 mg/dL (ref 8.4–10.4)
Chloride: 103 mEq/L (ref 98–107)
Sodium: 140 mEq/L (ref 136–145)
Total Bilirubin: 0.2 mg/dL (ref 0.20–1.20)
Total Protein: 7.1 g/dL (ref 6.4–8.3)

## 2012-09-06 LAB — CBC WITH DIFFERENTIAL/PLATELET
BASO%: 0.2 % (ref 0.0–2.0)
Eosinophils Absolute: 0 10*3/uL (ref 0.0–0.5)
MCHC: 33.7 g/dL (ref 31.5–36.0)
MONO#: 1.1 10*3/uL — ABNORMAL HIGH (ref 0.1–0.9)
NEUT#: 9.6 10*3/uL — ABNORMAL HIGH (ref 1.5–6.5)
RBC: 3.11 10*6/uL — ABNORMAL LOW (ref 3.70–5.45)
RDW: 19 % — ABNORMAL HIGH (ref 11.2–14.5)
WBC: 12.9 10*3/uL — ABNORMAL HIGH (ref 3.9–10.3)
nRBC: 0 % (ref 0–0)

## 2012-09-06 MED ORDER — ACETAMINOPHEN 325 MG PO TABS
650.0000 mg | ORAL_TABLET | Freq: Once | ORAL | Status: AC
  Administered 2012-09-06: 650 mg via ORAL

## 2012-09-06 MED ORDER — SODIUM CHLORIDE 0.9 % IJ SOLN
10.0000 mL | INTRAMUSCULAR | Status: DC | PRN
  Administered 2012-09-06: 10 mL
  Filled 2012-09-06: qty 10

## 2012-09-06 MED ORDER — AZITHROMYCIN 250 MG PO TABS
ORAL_TABLET | ORAL | Status: DC
Start: 2012-09-06 — End: 2012-09-20

## 2012-09-06 MED ORDER — HEPARIN SOD (PORK) LOCK FLUSH 100 UNIT/ML IV SOLN
500.0000 [IU] | Freq: Once | INTRAVENOUS | Status: AC | PRN
  Administered 2012-09-06: 500 [IU]
  Filled 2012-09-06: qty 5

## 2012-09-06 MED ORDER — SODIUM CHLORIDE 0.9 % IV SOLN
Freq: Once | INTRAVENOUS | Status: AC
  Administered 2012-09-06: 13:00:00 via INTRAVENOUS

## 2012-09-06 MED ORDER — TRASTUZUMAB CHEMO INJECTION 440 MG
2.0000 mg/kg | Freq: Once | INTRAVENOUS | Status: AC
  Administered 2012-09-06: 105 mg via INTRAVENOUS
  Filled 2012-09-06: qty 5

## 2012-09-06 MED ORDER — DIPHENHYDRAMINE HCL 25 MG PO CAPS
50.0000 mg | ORAL_CAPSULE | Freq: Once | ORAL | Status: AC
  Administered 2012-09-06: 50 mg via ORAL

## 2012-09-06 NOTE — Patient Instructions (Signed)
[facility] Discharge Instructions for Patients Receiving Chemotherapy  Today you received the following chemotherapy agents Herceptin To help prevent nausea and vomiting after your treatment, we encourage you to take your nausea medication as prescribed.  If you develop nausea and vomiting that is not controlled by your nausea medication, call the clinic. If it is after clinic hours your family physician or the after hours number for the clinic or go to the Emergency Department.   BELOW ARE SYMPTOMS THAT SHOULD BE REPORTED IMMEDIATELY:  *FEVER GREATER THAN 100.5 F  *CHILLS WITH OR WITHOUT FEVER  NAUSEA AND VOMITING THAT IS NOT CONTROLLED WITH YOUR NAUSEA MEDICATION  *UNUSUAL SHORTNESS OF BREATH  *UNUSUAL BRUISING OR BLEEDING  TENDERNESS IN MOUTH AND THROAT WITH OR WITHOUT PRESENCE OF ULCERS  *URINARY PROBLEMS  *BOWEL PROBLEMS  UNUSUAL RASH Items with * indicate a potential emergency and should be followed up as soon as possible.  One of the nurses will contact you 24 hours after your treatment. Please let the nurse know about any problems that you may have experienced. Feel free to call the clinic you have any questions or concerns. The clinic phone number is [phone].   I have been informed and understand all the instructions given to me. I know to contact the clinic, my physician, or go to the Emergency Department if any problems should occur. I do not have any questions at this time, but understand that I may call the clinic during office hours   should I have any questions or need assistance in obtaining follow up care.    __________________________________________  _____________  __________ Signature of Patient or Authorized Representative            Date                   Time    __________________________________________ Nurse's Signature    

## 2012-09-06 NOTE — Patient Instructions (Addendum)
Doing well.  Proceed with Herceptin.  I have called in a Zpak to your pharmacy for your sinusitis.  Also you can take sudafed if you need it for your congestion.    Please call us if you have any questions or concerns.

## 2012-09-06 NOTE — Progress Notes (Signed)
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  PRIOR THERAPY:  #1 patient was originally seen in the multidisciplinary breast clinic when she found a left breast mass she eventually had MRI and mammograms performed. She was diagnosed with invasive ductal carcinoma that was ER positive PR positive HER-2/neu positive measuring 3.1 cm by MRI criteria. Ki-67 was 70% HER-2 was amplified with a ratio 2.91.  #2 patient was seen at the multidisciplinary breast clinic and a recommendation for neoadjuvant chemotherapy was recommended as patient was interested in breast conservation. She will receive neoadjuvant Taxotere carboplatinum Herceptin. Taxotere carboplatinum will be given every 21 days with weekly Herceptin. Total of 6 cycles of Taxotere and carboplatinum will be administered.  Her subsequent history is as detailed below  CURRENT THERAPY: Cycle 3 Day 15 TCH with Neulasta support, weekly Herceptin  INTERVAL HISTORY: Ms. Mcnicholas is doing well today.  She developed a cold last Monday.  She has a cough, nasal drainage, congestion, and sinus headache.  She hasn't tried anything OTC because she wasn't sure what she could take with her chemotherapy.  She denies fevers, chills, nausea, vomiting, diarrhea, constipation, or any other questions/concerns.    Past Medical History  Diagnosis Date  . Breast cancer   . Headache   . Back pain   . 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 [place] fever     ALLERGIES:  is allergic to aspirin and iodinated diagnostic agents.  MEDICATIONS:  Current Outpatient Prescriptions  Medication Sig Dispense Refill  . albuterol (VENTOLIN HFA) 108 (90 BASE)  MCG/ACT inhaler Inhale 2 puffs into the lungs every 6 (six) hours as needed.        . Alum & Mag Hydroxide-Simeth (MAGIC MOUTHWASH) SOLN Take 15 mLs by mouth 3 (three) times daily as needed.  240 mL  3  . amoxicillin (AMOXIL) 500 MG capsule Take 1 capsule (500 mg total) by mouth 3 (three) times daily.  30 capsule  11  . dexamethasone (DECADRON) 4 MG tablet Take 2 tablets (8 mg total) by mouth 2 (two) times daily with a meal. Take two times a day the day before Taxotere. Then take two times a day starting the day after chemo for 3 days.  30 tablet  4  . lidocaine-prilocaine (EMLA) cream Apply topically as needed.  30 g  7  . LORazepam (ATIVAN) 0.5 MG tablet TAKE 1 TABLET BY MOUTH EVERY 6 HOURS AS NEEDED FOR NAUSEA/VOMITING  30 tablet  0  . naproxen sodium (ANAPROX) 220 MG tablet Take 220 mg by mouth 2 (two) times daily with a meal.      . omeprazole (PRILOSEC) 40 MG capsule Take 1 capsule (40 mg total) by mouth daily.  30 capsule  2  . ondansetron (ZOFRAN) 8 MG tablet Take 1 tablet (8 mg total) by mouth 2 (two) times daily. Take two times a day starting the day after chemo for 3 days. Then take two times a day as needed for nausea or vomiting.  30 tablet  1  . prochlorperazine (COMPAZINE) 10 MG tablet Take 1 tablet (10 mg total) by mouth every 6 (six) hours as needed (Nausea or vomiting).  30 tablet  1  .  valACYclovir (VALTREX) 500 MG tablet       . azithromycin (ZITHROMAX) 250 MG tablet Take 2 tabs on day 1, then 1 tab daily until complete  6 each  0   No current facility-administered medications for this visit.   Facility-Administered Medications Ordered in Other Visits  Medication Dose Route Frequency Provider Last Rate Last Dose  . heparin lock flush 100 unit/mL  500 Units Intracatheter Once PRN Victorino December, MD      . sodium chloride 0.9 % injection 10 mL  10 mL Intracatheter PRN Victorino December, MD      . trastuzumab (HERCEPTIN) 105 mg in sodium chloride 0.9 % 250 mL chemo infusion  2 mg/kg  (Treatment Plan Actual) Intravenous Once Victorino December, MD 510 mL/hr at 09/06/12 1313 105 mg at 09/06/12 1313    SURGICAL HISTORY:  Past Surgical History  Procedure Laterality Date  . 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 Cesarean section  1995/1996  . Lobectomy  1997    upper left  . Portacath placement  07/02/2012    Procedure: INSERTION PORT-A-CATH;  Surgeon: Mariella Saa, MD;  Location: [facility];  Service: General;  Laterality: N/A;  right    REVIEW OF SYSTEMS:   General: fatigue (-), night sweats (-), fever (-), pain (-) Lymph: palpable nodes (-) HEENT: vision changes (-), mucositis (-), gum bleeding (-), epistaxis (-) Cardiovascular: chest pain (-), palpitations (-) Pulmonary: shortness of breath (-), dyspnea on exertion (-), cough (-), hemoptysis (-) GI:  Early satiety (-), melena (-), dysphagia (-), nausea/vomiting (-), diarrhea (-) GU: dysuria (-), hematuria (-), incontinence (-) Musculoskeletal: joint swelling (-), joint pain (-), back pain (-) Neuro: weakness (-), numbness (-), headache (-), confusion (-) Skin: Rash (-), lesions (-), dryness (-) Psych: depression (-), suicidal/homicidal ideation (-), feeling of hopelessness (-)   PHYSICAL EXAMINATION: Blood pressure 121/82, pulse 88, temperature 97.9 F (36.6 C), temperature source Oral, resp. rate 20, height 5' 2.5" (1.588 m), weight 119 lb 11.2 oz (54.296 kg). Body mass index is 21.53 kg/(m^2). General: Patient is a well appearing female in no acute distress HEENT: PERRLA, sclerae anicteric no conjunctival pallor, MMM, +frontal sinus tenderness Neck: supple, no palpable adenopathy Lungs: mild wheezing in the left lung.  Right lung clear to auscultation Cardiovascular: regular rate rhythm, S1, S2, no murmurs, rubs or gallops Abdomen: Soft, non-tender, non-distended, normoactive bowel sounds, no HSM Extremities: warm and well perfused, no clubbing, cyanosis, or  edema Skin: No rashes or lesions Neuro: Non-focal Breasts: The right breast is unremarkable. In the left breast in the superior lateral aspect up of the areola there is still a palpable mass measuring perhaps 3 cm. It is relatively soft. There is no erythema or other skin change and no nipple retraction. The left axilla was clear ECOG PERFORMANCE STATUS: 1  LABORATORY DATA: Lab Results  Component Value Date   WBC 12.9* 09/06/2012   HGB 10.1* 09/06/2012   HCT 30.0* 09/06/2012   MCV 96.5 09/06/2012   PLT 79* 09/06/2012      Chemistry      Component Value Date/Time   NA 140 09/06/2012 1116   NA 141 01/26/2011 1039   K 3.8 09/06/2012 1116   K 4.5 01/26/2011 1039   CL 103 09/06/2012 1116   CL 106 01/26/2011 1039   CO2 27 09/06/2012 1116   CO2 28 01/26/2011 1039   BUN 5.8* 09/06/2012 1116  BUN 7 01/26/2011 1039   CREATININE 0.7 09/06/2012 1116   CREATININE 0.6 01/26/2011 1039      Component Value Date/Time   CALCIUM 9.6 09/06/2012 1116   CALCIUM 9.2 01/26/2011 1039   ALKPHOS 111 09/06/2012 1116   AST 18 09/06/2012 1116   ALT 20 09/06/2012 1116   BILITOT 0.20 09/06/2012 1116       RADIOGRAPHIC STUDIES: Dg Chest 2 View  07/30/2012  *RADIOLOGY REPORT*  Clinical Data: Bronchiectasis.  History of breast cancer.  CHEST - 2 VIEW  Comparison: 07/02/2012 and 05/03/2011  Findings: Power port in place.  There is now an ill-defined 16 mm in density in the left lung base which correlates with the previous cystic lesion seen on CT scan of 01/26/2011.  There is central lucency.  I suspect this represents retraction and wall thickening of the previously seen area of cystic bronchiectasis.  Scarring at the left apex medially due to cystic bronchiectasis appears slightly less prominent.  The right lung is clear.  Heart size is normal.  Previous left upper lobectomy.  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  No other change since the prior exam.    Original Report Authenticated By: Francene Boyers, M.D.      ASSESSMENT: 47 year old [place] woman  with  #1 a clinical stage IIA (3.1 cm) invasive ductal carcinoma of the left breast status post needle core biopsy 06/05/2012 that showed ER positive PR positive HER-2/neu positive breast cancer with a Ki-67 at 70%.   #2 started chemotherapy on 07/13/2011 consisting of carboplatin, docetaxel and trastuzumab every 3 weeks, with the trastuzumab given weekly  #3 trastuzumab to be continued to complete a year;  most recent Echo 06/25/2012   #4 Sinusitis  PLAN:   1. Ms. mohar is feeling well today.  She will proceed with Herceptin.    2. I will see her back next week for her next Herceptin treatment.    3.  She will take sudafed for her congestion and I prescribed a Zpak for sinusitis.    All questions were answered. The patient knows to call the clinic with any problems, questions or concerns. We can certainly see the patient much sooner if necessary.   I spent 25 minutes counseling the patient face to face. The total time spent in the appointment was 30 minutes.  Cherie Ouch Lyn Hollingshead, NP Medical Oncology [facility] Phone: [phone] 09/06/2012, 1:20 PM

## 2012-09-12 ENCOUNTER — Encounter: Payer: Self-pay | Admitting: Genetic Counselor

## 2012-09-13 ENCOUNTER — Telehealth: Payer: Self-pay | Admitting: Oncology

## 2012-09-13 ENCOUNTER — Other Ambulatory Visit (HOSPITAL_BASED_OUTPATIENT_CLINIC_OR_DEPARTMENT_OTHER): Payer: BC Managed Care – PPO | Admitting: Lab

## 2012-09-13 ENCOUNTER — Ambulatory Visit (HOSPITAL_BASED_OUTPATIENT_CLINIC_OR_DEPARTMENT_OTHER): Payer: BC Managed Care – PPO | Admitting: Adult Health

## 2012-09-13 ENCOUNTER — Ambulatory Visit (HOSPITAL_BASED_OUTPATIENT_CLINIC_OR_DEPARTMENT_OTHER): Payer: BC Managed Care – PPO

## 2012-09-13 ENCOUNTER — Encounter: Payer: Self-pay | Admitting: Adult Health

## 2012-09-13 VITALS — BP 120/78 | HR 85 | Temp 97.9°F | Resp 20 | Ht 62.5 in | Wt 121.5 lb

## 2012-09-13 DIAGNOSIS — C50219 Malignant neoplasm of upper-inner quadrant of unspecified female breast: Secondary | ICD-10-CM

## 2012-09-13 DIAGNOSIS — Z5112 Encounter for antineoplastic immunotherapy: Secondary | ICD-10-CM

## 2012-09-13 DIAGNOSIS — Z5111 Encounter for antineoplastic chemotherapy: Secondary | ICD-10-CM

## 2012-09-13 DIAGNOSIS — C50919 Malignant neoplasm of unspecified site of unspecified female breast: Secondary | ICD-10-CM

## 2012-09-13 LAB — CBC WITH DIFFERENTIAL/PLATELET
BASO%: 0.1 % (ref 0.0–2.0)
Basophils Absolute: 0 10*3/uL (ref 0.0–0.1)
EOS%: 0 % (ref 0.0–7.0)
HCT: 27.6 % — ABNORMAL LOW (ref 34.8–46.6)
HGB: 9.3 g/dL — ABNORMAL LOW (ref 11.6–15.9)
MONO#: 0.1 10*3/uL (ref 0.1–0.9)
NEUT%: 88.2 % — ABNORMAL HIGH (ref 38.4–76.8)
RDW: 19.9 % — ABNORMAL HIGH (ref 11.2–14.5)
WBC: 9.3 10*3/uL (ref 3.9–10.3)
lymph#: 1 10*3/uL (ref 0.9–3.3)

## 2012-09-13 LAB — COMPREHENSIVE METABOLIC PANEL (CC13)
AST: 14 U/L (ref 5–34)
Albumin: 3.4 g/dL — ABNORMAL LOW (ref 3.5–5.0)
Alkaline Phosphatase: 73 U/L (ref 40–150)
BUN: 9.1 mg/dL (ref 7.0–26.0)
Calcium: 10 mg/dL (ref 8.4–10.4)
Chloride: 105 mEq/L (ref 98–107)
Glucose: 118 mg/dl — ABNORMAL HIGH (ref 70–99)
Potassium: 3.8 mEq/L (ref 3.5–5.1)
Sodium: 140 mEq/L (ref 136–145)
Total Protein: 7.1 g/dL (ref 6.4–8.3)

## 2012-09-13 MED ORDER — TRASTUZUMAB CHEMO INJECTION 440 MG
2.0000 mg/kg | Freq: Once | INTRAVENOUS | Status: AC
  Administered 2012-09-13: 105 mg via INTRAVENOUS
  Filled 2012-09-13: qty 5

## 2012-09-13 MED ORDER — DEXAMETHASONE SODIUM PHOSPHATE 4 MG/ML IJ SOLN
20.0000 mg | Freq: Once | INTRAMUSCULAR | Status: AC
  Administered 2012-09-13: 20 mg via INTRAVENOUS

## 2012-09-13 MED ORDER — ACETAMINOPHEN 325 MG PO TABS
650.0000 mg | ORAL_TABLET | Freq: Once | ORAL | Status: AC
  Administered 2012-09-13: 650 mg via ORAL

## 2012-09-13 MED ORDER — HEPARIN SOD (PORK) LOCK FLUSH 100 UNIT/ML IV SOLN
500.0000 [IU] | Freq: Once | INTRAVENOUS | Status: AC | PRN
  Administered 2012-09-13: 500 [IU]
  Filled 2012-09-13: qty 5

## 2012-09-13 MED ORDER — DIPHENHYDRAMINE HCL 25 MG PO CAPS
50.0000 mg | ORAL_CAPSULE | Freq: Once | ORAL | Status: AC
  Administered 2012-09-13: 50 mg via ORAL

## 2012-09-13 MED ORDER — SODIUM CHLORIDE 0.9 % IJ SOLN
10.0000 mL | INTRAMUSCULAR | Status: DC | PRN
  Administered 2012-09-13: 10 mL
  Filled 2012-09-13: qty 10

## 2012-09-13 MED ORDER — DEXTROSE 5 % IV SOLN
75.0000 mg/m2 | Freq: Once | INTRAVENOUS | Status: AC
  Administered 2012-09-13: 110 mg via INTRAVENOUS
  Filled 2012-09-13: qty 11

## 2012-09-13 MED ORDER — SODIUM CHLORIDE 0.9 % IV SOLN
680.0000 mg | Freq: Once | INTRAVENOUS | Status: AC
  Administered 2012-09-13: 680 mg via INTRAVENOUS
  Filled 2012-09-13: qty 68

## 2012-09-13 MED ORDER — SODIUM CHLORIDE 0.9 % IV SOLN
Freq: Once | INTRAVENOUS | Status: AC
  Administered 2012-09-13: 13:00:00 via INTRAVENOUS

## 2012-09-13 MED ORDER — ONDANSETRON 16 MG/50ML IVPB (CHCC)
16.0000 mg | Freq: Once | INTRAVENOUS | Status: AC
  Administered 2012-09-13: 16 mg via INTRAVENOUS

## 2012-09-13 NOTE — Patient Instructions (Addendum)
Doing well.  Proceed with chemotherapy.  Please call us if you have any questions or concerns.    

## 2012-09-13 NOTE — Patient Instructions (Signed)
[facility] Discharge Instructions for Patients Receiving Chemotherapy  Today you received the following chemotherapy agents Taxotere, Carboplatin, Herceptin  To help prevent nausea and vomiting after your treatment, we encourage you to take your nausea medication.    If you develop nausea and vomiting that is not controlled by your nausea medication, call the clinic. If it is after clinic hours your family physician or the after hours number for the clinic or go to the Emergency Department.   BELOW ARE SYMPTOMS THAT SHOULD BE REPORTED IMMEDIATELY:  *FEVER GREATER THAN 100.5 F  *CHILLS WITH OR WITHOUT FEVER  NAUSEA AND VOMITING THAT IS NOT CONTROLLED WITH YOUR NAUSEA MEDICATION  *UNUSUAL SHORTNESS OF BREATH  *UNUSUAL BRUISING OR BLEEDING  TENDERNESS IN MOUTH AND THROAT WITH OR WITHOUT PRESENCE OF ULCERS  *URINARY PROBLEMS  *BOWEL PROBLEMS  UNUSUAL RASH Items with * indicate a potential emergency and should be followed up as soon as possible.  One of the nurses will contact you 24 hours after your treatment. Please let the nurse know about any problems that you may have experienced. Feel free to call the clinic you have any questions or concerns. The clinic phone number is [phone].   I have been informed and understand all the instructions given to me. I know to contact the clinic, my physician, or go to the Emergency Department if any problems should occur. I do not have any questions at this time, but understand that I may call the clinic during office hours   should I have any questions or need assistance in obtaining follow up care.    __________________________________________  _____________  __________ Signature of Patient or Authorized Representative            Date                   Time    __________________________________________ Nurse's Signature    

## 2012-09-13 NOTE — Progress Notes (Signed)
OFFICE PROGRESS NOTE  CC  PCP: [name], MD   [name], MD [address] [place] [address]  DIAGNOSIS: 47 year old female with premenopausal ER positive PR positive HER-2/neu positive invasive ductal carcinoma of the left breast clinical stage IIA diagnosed in November 2013.  PRIOR THERAPY:  #1 patient was originally seen in the multidisciplinary breast clinic when she found a left breast mass she eventually had MRI and mammograms performed. She was diagnosed with invasive ductal carcinoma that was ER positive PR positive HER-2/neu positive measuring 3.1 cm by MRI criteria. Ki-67 was 70% HER-2 was amplified with a ratio 2.91.  #2 patient was seen at the multidisciplinary breast clinic and a recommendation for neoadjuvant chemotherapy was recommended as patient was interested in breast conservation. She will receive neoadjuvant Taxotere carboplatinum Herceptin. Taxotere carboplatinum will be given every 21 days with weekly Herceptin. Total of 6 cycles of Taxotere and carboplatinum will be administered.  Her subsequent history is as detailed below  CURRENT THERAPY: Cycle 4 Day 1 TCH with Neulasta support, weekly Herceptin  INTERVAL HISTORY: Ms. Popowski is doing well today.  Her upper respiratory infection has resolved.  She does endorse intermittent mild numbness in the fingertips in her left hand.  She has retained motor function and has no difficulty buttoning, or clasping earrings.  She denies fevers, chills, nausea, vomiting, pain, or any other concerns.     Past Medical History  Diagnosis Date  . Breast cancer   . Headache   . Back pain   . 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 [place] fever     ALLERGIES:  is allergic to aspirin and iodinated diagnostic agents.  MEDICATIONS:  Current Outpatient Prescriptions  Medication Sig Dispense Refill  . albuterol (VENTOLIN HFA) 108 (90 BASE) MCG/ACT inhaler  Inhale 2 puffs into the lungs every 6 (six) hours as needed.        . Alum & Mag Hydroxide-Simeth (MAGIC MOUTHWASH) SOLN Take 15 mLs by mouth 3 (three) times daily as needed.  240 mL  3  . amoxicillin (AMOXIL) 500 MG capsule Take 1 capsule (500 mg total) by mouth 3 (three) times daily.  30 capsule  11  . azithromycin (ZITHROMAX) 250 MG tablet Take 2 tabs on day 1, then 1 tab daily until complete  6 each  0  . dexamethasone (DECADRON) 4 MG tablet Take 2 tablets (8 mg total) by mouth 2 (two) times daily with a meal. Take two times a day the day before Taxotere. Then take two times a day starting the day after chemo for 3 days.  30 tablet  4  . lidocaine-prilocaine (EMLA) cream Apply topically as needed.  30 g  7  . LORazepam (ATIVAN) 0.5 MG tablet TAKE 1 TABLET BY MOUTH EVERY 6 HOURS AS NEEDED FOR NAUSEA/VOMITING  30 tablet  0  . naproxen sodium (ANAPROX) 220 MG tablet Take 220 mg by mouth 2 (two) times daily with a meal.      . omeprazole (PRILOSEC) 40 MG capsule Take 1 capsule (40 mg total) by mouth daily.  30 capsule  2  . ondansetron (ZOFRAN) 8 MG tablet Take 1 tablet (8 mg total) by mouth 2 (two) times daily. Take two times a day starting the day after chemo for 3 days. Then take two times a day as needed for nausea or vomiting.  30 tablet  1  . prochlorperazine (COMPAZINE) 10  MG tablet Take 1 tablet (10 mg total) by mouth every 6 (six) hours as needed (Nausea or vomiting).  30 tablet  1  . valACYclovir (VALTREX) 500 MG tablet        No current facility-administered medications for this visit.    SURGICAL HISTORY:  Past Surgical History  Procedure Laterality Date  . 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 Cesarean section  1995/1996  . Lobectomy  1997    upper left  . Portacath placement  07/02/2012    Procedure: INSERTION PORT-A-CATH;  Surgeon: Mariella Saa, MD;  Location: [facility];  Service: General;  Laterality: N/A;  right    REVIEW OF  SYSTEMS:   General: fatigue (-), night sweats (-), fever (-), pain (-) Lymph: palpable nodes (-) HEENT: vision changes (-), mucositis (-), gum bleeding (-), epistaxis (-) Cardiovascular: chest pain (-), palpitations (-) Pulmonary: shortness of breath (-), dyspnea on exertion (-), cough (-), hemoptysis (-) GI:  Early satiety (-), melena (-), dysphagia (-), nausea/vomiting (-), diarrhea (-) GU: dysuria (-), hematuria (-), incontinence (-) Musculoskeletal: joint swelling (-), joint pain (-), back pain (-) Neuro: weakness (-), numbness (+), headache (-), confusion (-) Skin: Rash (-), lesions (-), dryness (-) Psych: depression (-), suicidal/homicidal ideation (-), feeling of hopelessness (-)   PHYSICAL EXAMINATION: Blood pressure 120/78, pulse 85, temperature 97.9 F (36.6 C), temperature source Oral, resp. rate 20, height 5' 2.5" (1.588 m), weight 121 lb 8 oz (55.112 kg). Body mass index is 21.85 kg/(m^2). General: Patient is a well appearing female in no acute distress HEENT: PERRLA, sclerae anicteric no conjunctival pallor, MMM, +frontal sinus tenderness Neck: supple, no palpable adenopathy Lungs: mild wheezing in the left lung.  Right lung clear to auscultation Cardiovascular: regular rate rhythm, S1, S2, no murmurs, rubs or gallops Abdomen: Soft, non-tender, non-distended, normoactive bowel sounds, no HSM Extremities: warm and well perfused, no clubbing, cyanosis, or edema Skin: No rashes or lesions Neuro: Non-focal Breasts: The right breast is unremarkable. In the left breast in the superior lateral aspect up of the areola there is still a palpable mass measuring perhaps 3 cm. It is relatively soft. There is no erythema or other skin change and no nipple retraction. The left axilla was clear ECOG PERFORMANCE STATUS: 1  LABORATORY DATA: Lab Results  Component Value Date   WBC 9.3 09/13/2012   HGB 9.3* 09/13/2012   HCT 27.6* 09/13/2012   MCV 97.9 09/13/2012   PLT 101* 09/13/2012       Chemistry      Component Value Date/Time   NA 140 09/13/2012 1115   NA 141 01/26/2011 1039   K 3.8 09/13/2012 1115   K 4.5 01/26/2011 1039   CL 105 09/13/2012 1115   CL 106 01/26/2011 1039   CO2 26 09/13/2012 1115   CO2 28 01/26/2011 1039   BUN 9.1 09/13/2012 1115   BUN 7 01/26/2011 1039   CREATININE 0.7 09/13/2012 1115   CREATININE 0.6 01/26/2011 1039      Component Value Date/Time   CALCIUM 10.0 09/13/2012 1115   CALCIUM 9.2 01/26/2011 1039   ALKPHOS 73 09/13/2012 1115   AST 14 09/13/2012 1115   ALT 19 09/13/2012 1115   BILITOT 0.22 09/13/2012 1115       RADIOGRAPHIC STUDIES: Dg Chest 2 View  07/30/2012  *RADIOLOGY REPORT*  Clinical Data: Bronchiectasis.  History of breast cancer.  CHEST - 2 VIEW  Comparison: 07/02/2012 and 05/03/2011  Findings: Power port in place.  There is now an ill-defined 16 mm in density in the left lung base which correlates with the previous cystic lesion seen on CT scan of 01/26/2011.  There is central lucency.  I suspect this represents retraction and wall thickening of the previously seen area of cystic bronchiectasis.  Scarring at the left apex medially due to cystic bronchiectasis appears slightly less prominent.  The right lung is clear.  Heart size is normal.  Previous left upper lobectomy.  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  No other change since the prior exam.   Original Report Authenticated By: Francene Boyers, M.D.      ASSESSMENT: 47 year old [place] woman  with  #1 a clinical stage IIA (3.1 cm) invasive ductal carcinoma of the left breast status post needle core biopsy 06/05/2012 that showed ER positive PR positive HER-2/neu positive breast cancer with a Ki-67 at 70%.   #2 started chemotherapy on 07/13/2011 consisting of carboplatin, docetaxel and trastuzumab every 3 weeks, with the trastuzumab given weekly  #3 trastuzumab to be continued to complete a year;  most recent Echo 06/25/2012   #4  Neuropathy  PLAN:   1. Ms. zamorano is feeling well today.  She will proceed with [facility] today.     2. I will see her back next week for her next Herceptin treatment.    3.  She will take Super B complex daily for the numbness, should it worsen, we will start Gabapentin.      All questions were answered. The patient knows to call the clinic with any problems, questions or concerns. We can certainly see the patient much sooner if necessary.   I spent 25 minutes counseling the patient face to face. The total time spent in the appointment was 30 minutes.  Cherie Ouch Lyn Hollingshead, NP Medical Oncology [facility] Phone: [phone] 09/15/2012, 9:06 AM

## 2012-09-13 NOTE — Telephone Encounter (Signed)
, °

## 2012-09-14 ENCOUNTER — Ambulatory Visit (HOSPITAL_BASED_OUTPATIENT_CLINIC_OR_DEPARTMENT_OTHER): Payer: BC Managed Care – PPO

## 2012-09-14 VITALS — BP 129/85 | HR 89 | Temp 98.5°F

## 2012-09-14 DIAGNOSIS — C50919 Malignant neoplasm of unspecified site of unspecified female breast: Secondary | ICD-10-CM

## 2012-09-14 DIAGNOSIS — C50219 Malignant neoplasm of upper-inner quadrant of unspecified female breast: Secondary | ICD-10-CM

## 2012-09-14 DIAGNOSIS — Z5189 Encounter for other specified aftercare: Secondary | ICD-10-CM

## 2012-09-14 MED ORDER — PEGFILGRASTIM INJECTION 6 MG/0.6ML
6.0000 mg | Freq: Once | SUBCUTANEOUS | Status: AC
  Administered 2012-09-14: 6 mg via SUBCUTANEOUS
  Filled 2012-09-14: qty 0.6

## 2012-09-20 ENCOUNTER — Encounter (HOSPITAL_COMMUNITY): Payer: Self-pay

## 2012-09-20 ENCOUNTER — Ambulatory Visit (HOSPITAL_BASED_OUTPATIENT_CLINIC_OR_DEPARTMENT_OTHER): Payer: BC Managed Care – PPO

## 2012-09-20 ENCOUNTER — Ambulatory Visit (HOSPITAL_BASED_OUTPATIENT_CLINIC_OR_DEPARTMENT_OTHER): Payer: BC Managed Care – PPO | Admitting: Adult Health

## 2012-09-20 ENCOUNTER — Other Ambulatory Visit (HOSPITAL_BASED_OUTPATIENT_CLINIC_OR_DEPARTMENT_OTHER): Payer: BC Managed Care – PPO | Admitting: Lab

## 2012-09-20 ENCOUNTER — Ambulatory Visit (HOSPITAL_COMMUNITY)
Admission: RE | Admit: 2012-09-20 | Discharge: 2012-09-20 | Disposition: A | Discharge status: Home / Self Care | Payer: BC Managed Care – PPO | Source: Ambulatory Visit | Attending: Family Medicine | Admitting: Family Medicine

## 2012-09-20 ENCOUNTER — Encounter: Payer: Self-pay | Admitting: Adult Health

## 2012-09-20 ENCOUNTER — Other Ambulatory Visit: Payer: Self-pay

## 2012-09-20 ENCOUNTER — Ambulatory Visit (HOSPITAL_COMMUNITY)
Admission: RE | Admit: 2012-09-20 | Discharge: 2012-09-20 | Disposition: A | Discharge status: Home / Self Care | Payer: BC Managed Care – PPO | Source: Ambulatory Visit | Attending: Internal Medicine | Admitting: Internal Medicine

## 2012-09-20 ENCOUNTER — Telehealth: Payer: Self-pay | Admitting: Oncology

## 2012-09-20 VITALS — BP 110/72 | HR 106 | Temp 98.8°F | Resp 20 | Ht 62.5 in | Wt 116.9 lb

## 2012-09-20 VITALS — BP 124/86 | HR 119 | Wt 116.4 lb

## 2012-09-20 DIAGNOSIS — R000 Tachycardia, unspecified: Secondary | ICD-10-CM

## 2012-09-20 DIAGNOSIS — C50919 Malignant neoplasm of unspecified site of unspecified female breast: Secondary | ICD-10-CM

## 2012-09-20 DIAGNOSIS — C50219 Malignant neoplasm of upper-inner quadrant of unspecified female breast: Secondary | ICD-10-CM

## 2012-09-20 DIAGNOSIS — I519 Heart disease, unspecified: Secondary | ICD-10-CM

## 2012-09-20 DIAGNOSIS — Z5112 Encounter for antineoplastic immunotherapy: Secondary | ICD-10-CM

## 2012-09-20 LAB — CBC WITH DIFFERENTIAL/PLATELET
BASO%: 0.3 % (ref 0.0–2.0)
Eosinophils Absolute: 0.1 10*3/uL (ref 0.0–0.5)
HCT: 24.1 % — ABNORMAL LOW (ref 34.8–46.6)
LYMPH%: 68.1 % — ABNORMAL HIGH (ref 14.0–49.7)
MCHC: 34.9 g/dL (ref 31.5–36.0)
MONO#: 0.3 10*3/uL (ref 0.1–0.9)
NEUT#: 0.6 10*3/uL — ABNORMAL LOW (ref 1.5–6.5)
NEUT%: 19.7 % — ABNORMAL LOW (ref 38.4–76.8)
Platelets: 57 10*3/uL — ABNORMAL LOW (ref 145–400)
WBC: 3.3 10*3/uL — ABNORMAL LOW (ref 3.9–10.3)
lymph#: 2.2 10*3/uL (ref 0.9–3.3)
nRBC: 0 % (ref 0–0)

## 2012-09-20 LAB — COMPREHENSIVE METABOLIC PANEL (CC13)
ALT: 16 U/L (ref 0–55)
AST: 13 U/L (ref 5–34)
Alkaline Phosphatase: 77 U/L (ref 40–150)
Chloride: 97 mEq/L — ABNORMAL LOW (ref 98–107)
Creatinine: 0.8 mg/dL (ref 0.6–1.1)
Total Bilirubin: 0.43 mg/dL (ref 0.20–1.20)

## 2012-09-20 MED ORDER — TRASTUZUMAB CHEMO INJECTION 440 MG
2.0000 mg/kg | Freq: Once | INTRAVENOUS | Status: AC
  Administered 2012-09-20: 105 mg via INTRAVENOUS
  Filled 2012-09-20: qty 5

## 2012-09-20 MED ORDER — DIPHENHYDRAMINE HCL 25 MG PO CAPS
50.0000 mg | ORAL_CAPSULE | Freq: Once | ORAL | Status: AC
  Administered 2012-09-20: 50 mg via ORAL

## 2012-09-20 MED ORDER — CIPROFLOXACIN HCL 500 MG PO TABS: 500.0000 mg | ORAL_TABLET | Freq: Two times a day (BID) | ORAL | Status: DC

## 2012-09-20 MED ORDER — SODIUM CHLORIDE 0.9 % IV SOLN
Freq: Once | INTRAVENOUS | Status: AC
  Administered 2012-09-20: 16:00:00 via INTRAVENOUS

## 2012-09-20 MED ORDER — SODIUM CHLORIDE 0.9 % IV SOLN: Freq: Once | INTRAVENOUS | Status: DC

## 2012-09-20 MED ORDER — HEPARIN SOD (PORK) LOCK FLUSH 100 UNIT/ML IV SOLN
500.0000 [IU] | Freq: Once | INTRAVENOUS | Status: AC | PRN
  Administered 2012-09-20: 500 [IU]
  Filled 2012-09-20: qty 5

## 2012-09-20 MED ORDER — SODIUM CHLORIDE 0.9 % IJ SOLN
10.0000 mL | INTRAMUSCULAR | Status: DC | PRN
  Administered 2012-09-20: 10 mL
  Filled 2012-09-20: qty 10

## 2012-09-20 MED ORDER — ACETAMINOPHEN 325 MG PO TABS
650.0000 mg | ORAL_TABLET | Freq: Once | ORAL | Status: AC
  Administered 2012-09-20: 650 mg via ORAL

## 2012-09-20 NOTE — Assessment & Plan Note (Signed)
Advised cessation, she will begin to work on this.

## 2012-09-20 NOTE — Assessment & Plan Note (Addendum)
Reviewed echo and lat s' with patient, both remain normal.  Will continue therapy at this time with repeat echo in 2 months.  Patient seen and examined with Ulyess Blossom, PA-C. We discussed all aspects of the encounter. I agree with the assessment and plan as stated above. I reviewed echos personally. EF and Doppler parameters stable. No HF on exam. Continue Herceptin.

## 2012-09-20 NOTE — Patient Instructions (Addendum)
[facility] Discharge Instructions for Patients Receiving Chemotherapy  Today you received the following chemotherapy agents: herceptin  To help prevent nausea and vomiting after your treatment, we encourage you to take your nausea medication.  Take it as often as prescribed.     If you develop nausea and vomiting that is not controlled by your nausea medication, call the clinic. If it is after clinic hours your family physician or the after hours number for the clinic or go to the Emergency Department.   BELOW ARE SYMPTOMS THAT SHOULD BE REPORTED IMMEDIATELY:  *FEVER GREATER THAN 100.5 F  *CHILLS WITH OR WITHOUT FEVER  NAUSEA AND VOMITING THAT IS NOT CONTROLLED WITH YOUR NAUSEA MEDICATION  *UNUSUAL SHORTNESS OF BREATH  *UNUSUAL BRUISING OR BLEEDING  TENDERNESS IN MOUTH AND THROAT WITH OR WITHOUT PRESENCE OF ULCERS  *URINARY PROBLEMS  *BOWEL PROBLEMS  UNUSUAL RASH Items with * indicate a potential emergency and should be followed up as soon as possible.  Feel free to call the clinic you have any questions or concerns. The clinic phone number is [phone].   I have been informed and understand all the instructions given to me. I know to contact the clinic, my physician, or go to the Emergency Department if any problems should occur. I do not have any questions at this time, but understand that I may call the clinic during office hours   should I have any questions or need assistance in obtaining follow up care.    __________________________________________  _____________  __________ Signature of Patient or Authorized Representative            Date                   Time    __________________________________________ Nurse's Signature    

## 2012-09-20 NOTE — Telephone Encounter (Signed)
gve the pt her April 2014 appt calendar with the appt to see dr Johna Sheriff . Pt is aware that she will be called by rad scheduling for the mri breast appt. Sent tanya brown an email regarduing adding on the pt's chemo appts. Pt will pick up the calendar at her next visit.

## 2012-09-20 NOTE — Patient Instructions (Addendum)
  Patient Neutropenia Instruction Sheet  Diagnosis: Breast Cancer      Treating Physician: Drue Second, MD  Treatment: 1. Type of chemotherapy: TCH 2. Date of last treatment: 09/13/12  Last Blood Counts: Lab Results  Component Value Date   WBC 3.3* 09/20/2012   HGB 8.4* 09/20/2012   HCT 24.1* 09/20/2012   MCV 97.6 09/20/2012   PLT 57* 09/20/2012   ANC 600     Prophylactic Antibiotics: Cipro 500 mg by mouth twice a day Instructions: 1. Monitor temperature and call if fever  greater than 100.5, chills, shaking chills (rigors) 2. Call Physician on-call at [phone] 3. Give him/her symptoms and list of medications that you are taking and your last blood count.

## 2012-09-20 NOTE — Progress Notes (Signed)
OFFICE PROGRESS NOTE  CC  PCP: [name], MD   [name], MD [address] [place] [address] [place] [address]  DIAGNOSIS: 47 year old female with premenopausal ER positive PR positive HER-2/neu positive invasive ductal carcinoma of the left breast clinical stage IIA diagnosed in November 2013.  PRIOR THERAPY:  #1 patient was originally seen in the multidisciplinary breast clinic when she found a left breast mass she eventually had MRI and mammograms performed. She was diagnosed with invasive ductal carcinoma that was ER positive PR positive HER-2/neu positive measuring 3.1 cm by MRI criteria. Ki-67 was 70% HER-2 was amplified with a ratio 2.91.  #2 patient was seen at the multidisciplinary breast clinic and a recommendation for neoadjuvant chemotherapy was recommended as patient was interested in breast conservation. She will receive neoadjuvant Taxotere carboplatinum Herceptin. Taxotere carboplatinum will be given every 21 days with weekly Herceptin. Total of 6 cycles of Taxotere and carboplatinum will be administered.  Her subsequent history is as detailed below  CURRENT THERAPY: Cycle 4 Day 8 TCH with Neulasta support, weekly Herceptin  INTERVAL HISTORY: Melissa [name] is doing well today.  She had slightly more difficulty with this cycle of TCH.  Her numbness is unchanged, but she is increasingly fatigued, dizzy, and experiencing palpitations.  She saw dr. Gala Romney today and had her echo, and he did an EKG which she says was normal.  She hasn't had any fevers, nausea, vomiting, or any other concerns.   Past Medical History  Diagnosis Date  . Breast cancer   . Headache   . Back pain   . 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 [place] fever     ALLERGIES:  is allergic to aspirin and iodinated diagnostic agents.  MEDICATIONS:  Current Outpatient Prescriptions  Medication Sig Dispense Refill  . Alum & Mag Hydroxide-Simeth  (MAGIC MOUTHWASH) SOLN Take 15 mLs by mouth 3 (three) times daily as needed.  240 mL  3  . amoxicillin (AMOXIL) 500 MG capsule Take 1 capsule (500 mg total) by mouth 3 (three) times daily.  30 capsule  11  . dexamethasone (DECADRON) 4 MG tablet Take 2 tablets (8 mg total) by mouth 2 (two) times daily with a meal. Take two times a day the day before Taxotere. Then take two times a day starting the day after chemo for 3 days.  30 tablet  4  . lidocaine-prilocaine (EMLA) cream Apply topically as needed.  30 g  7  . LORazepam (ATIVAN) 0.5 MG tablet TAKE 1 TABLET BY MOUTH EVERY 6 HOURS AS NEEDED FOR NAUSEA/VOMITING  30 tablet  0  . naproxen sodium (ANAPROX) 220 MG tablet Take 220 mg by mouth 2 (two) times daily with a meal.      . omeprazole (PRILOSEC) 40 MG capsule Take 1 capsule (40 mg total) by mouth daily.  30 capsule  2  . ondansetron (ZOFRAN) 8 MG tablet Take 1 tablet (8 mg total) by mouth 2 (two) times daily. Take two times a day starting the day after chemo for 3 days. Then take two times a day as needed for nausea or vomiting.  30 tablet  1  . prochlorperazine (COMPAZINE) 10 MG tablet Take 1 tablet (10 mg total) by mouth every 6 (six) hours as needed (Nausea or vomiting).  30 tablet  1  . valACYclovir (VALTREX) 500 MG tablet       . albuterol (VENTOLIN HFA) 108 (90 BASE)  MCG/ACT inhaler Inhale 2 puffs into the lungs every 6 (six) hours as needed.        . ciprofloxacin (CIPRO) 500 MG tablet Take 1 tablet (500 mg total) by mouth 2 (two) times daily.  14 tablet  3   Current Facility-Administered Medications  Medication Dose Route Frequency Provider Last Rate Last Dose  . 0.9 %  sodium chloride infusion   Intravenous Once Augustin Schooling, NP        SURGICAL HISTORY:  Past Surgical History  Procedure Laterality Date  . 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 Cesarean section  1995/1996  . Lobectomy  1997    upper left  . Portacath placement  07/02/2012     Procedure: INSERTION PORT-A-CATH;  Surgeon: Mariella Saa, MD;  Location: [facility];  Service: General;  Laterality: N/A;  right    REVIEW OF SYSTEMS:   General: fatigue (-), night sweats (-), fever (-), pain (-) Lymph: palpable nodes (-) HEENT: vision changes (-), mucositis (-), gum bleeding (-), epistaxis (-) Cardiovascular: chest pain (-), palpitations (-) Pulmonary: shortness of breath (-), dyspnea on exertion (-), cough (-), hemoptysis (-) GI:  Early satiety (-), melena (-), dysphagia (-), nausea/vomiting (-), diarrhea (-) GU: dysuria (-), hematuria (-), incontinence (-) Musculoskeletal: joint swelling (-), joint pain (-), back pain (-) Neuro: weakness (-), numbness (+), headache (-), confusion (-) Skin: Rash (-), lesions (-), dryness (-) Psych: depression (-), suicidal/homicidal ideation (-), feeling of hopelessness (-)   PHYSICAL EXAMINATION: Blood pressure 110/72, pulse 106, temperature 98.8 F (37.1 C), temperature source Oral, resp. rate 20, height 5' 2.5" (1.588 m), weight 116 lb 14.4 oz (53.025 kg). Body mass index is 21.03 kg/(m^2). General: Patient is a well appearing female in no acute distress HEENT: PERRLA, sclerae anicteric no conjunctival pallor, MMM, +frontal sinus tenderness Neck: supple, no palpable adenopathy Lungs: mild wheezing in the left lung.  Right lung clear to auscultation Cardiovascular: regular rate rhythm, S1, S2, no murmurs, rubs or gallops Abdomen: Soft, non-tender, non-distended, normoactive bowel sounds, no HSM Extremities: warm and well perfused, no clubbing, cyanosis, or edema Skin: No rashes or lesions Neuro: Non-focal Breasts: The right breast is unremarkable. In the left breast in the superior lateral aspect up of the areola there is still a palpable mass measuring perhaps 3 cm. It is relatively soft. There is no erythema or other skin change and no nipple retraction. The left axilla was clear ECOG PERFORMANCE STATUS:  1  LABORATORY DATA: Lab Results  Component Value Date   WBC 3.3* 09/20/2012   HGB 8.4* 09/20/2012   HCT 24.1* 09/20/2012   MCV 97.6 09/20/2012   PLT 57* 09/20/2012      Chemistry      Component Value Date/Time   NA 140 09/13/2012 1115   NA 141 01/26/2011 1039   K 3.8 09/13/2012 1115   K 4.5 01/26/2011 1039   CL 105 09/13/2012 1115   CL 106 01/26/2011 1039   CO2 26 09/13/2012 1115   CO2 28 01/26/2011 1039   BUN 9.1 09/13/2012 1115   BUN 7 01/26/2011 1039   CREATININE 0.7 09/13/2012 1115   CREATININE 0.6 01/26/2011 1039      Component Value Date/Time   CALCIUM 10.0 09/13/2012 1115   CALCIUM 9.2 01/26/2011 1039   ALKPHOS 73 09/13/2012 1115   AST 14 09/13/2012 1115   ALT 19 09/13/2012 1115   BILITOT 0.22 09/13/2012 1115  RADIOGRAPHIC STUDIES: Dg Chest 2 View  07/30/2012  *RADIOLOGY REPORT*  Clinical Data: Bronchiectasis.  History of breast cancer.  CHEST - 2 VIEW  Comparison: 07/02/2012 and 05/03/2011  Findings: Power port in place.  There is now an ill-defined 16 mm in density in the left lung base which correlates with the previous cystic lesion seen on CT scan of 01/26/2011.  There is central lucency.  I suspect this represents retraction and wall thickening of the previously seen area of cystic bronchiectasis.  Scarring at the left apex medially due to cystic bronchiectasis appears slightly less prominent.  The right lung is clear.  Heart size is normal.  Previous left upper lobectomy.  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  No other change since the prior exam.   Original Report Authenticated By: Francene Boyers, M.D.      ASSESSMENT: 47 year old [place] woman  with  #1 a clinical stage IIA (3.1 cm) invasive ductal carcinoma of the left breast status post needle core biopsy 06/05/2012 that showed ER positive PR positive HER-2/neu positive breast cancer with a Ki-67 at 70%.   #2 started chemotherapy on 07/13/2011 consisting of  carboplatin, docetaxel and trastuzumab every 3 weeks, with the trastuzumab given weekly  #3 trastuzumab to be continued to complete a year;  most recent Echo 06/25/2012   #4 Neuropathy  #5 Neutropenia  #6 Dehydration  PLAN:   1. Melissa [name] is feeling well today. She will proceed with Herceptin.  She is neutropenic.  I prescribed cipro and reviewed neutropenic instructions with her in detail.    2. I will see her back next week for her next Herceptin treatment.    3.  She will take Super B complex daily for the numbness, currently it is stable  4. I will add on IV fluids for the dehydration that occurred after treatment.   All questions were answered. The patient knows to call the clinic with any problems, questions or concerns. We can certainly see the patient much sooner if necessary.   I spent 25 minutes counseling the patient face to face. The total time spent in the appointment was 30 minutes.  Cherie Ouch Lyn Hollingshead, NP Medical Oncology [facility] Phone: [phone] 09/20/2012, 2:41 PM

## 2012-09-20 NOTE — Progress Notes (Signed)
OK to treat per Melissa [name], platelets 57, ANC 0.6

## 2012-09-20 NOTE — Assessment & Plan Note (Addendum)
Most likely related to chemotherapy.  EKG shows NSR at 91 bpm.  Have discussed increase fluid intake and salt.  If this becomes consistent will place monitor.  Patient seen and examined with Ulyess Blossom, PA-C. We discussed all aspects of the encounter. I agree with the assessment and plan as stated above. She appears volume depleted. Encouraged her to drink more fluids and increase salt intake.

## 2012-09-20 NOTE — Progress Notes (Signed)
  Echocardiogram 2D Echocardiogram has been performed.  Ellender Hose A 09/20/2012, 9:53 AM

## 2012-09-20 NOTE — Progress Notes (Signed)
Referring Physician: Dr. Welton Flakes Primary Care:  Primary Cardiologist: Pulmonologist: Dr. Sherene Sires   HPI: Melissa [name] is a 47 y.o. female referred by Dr. Welton Flakes for enrollment in the cardio-onc clinic.  She is from [place] and has history of coccidiomycosis s/p left upper lobectomy in 1997 and bronchiectasis which she is on chronic abx therapy, followed by Dr. Sherene Sires.  She was diagnosed with Stage IIA invasive left sided breast cancer that was found to be ER/PR and Her-2/neu positive with Ki-67 elevated at 70%.  She also continues to smoke cigarettes but this has gone from 1-1.5 ppd to 5-6 cigs/day.    She returns for follow up today.  She started herceptin weekly on 07/12/12 along with carboplatinum and taxotere q3 weeks.  She will finish this regimen beginning of May then go to q 3 weeks.  Denies orthopnea, PND or edema.  No chest pain.  Does c/o tachycardia over the last 3 days.  She is now smoking 1 ppd.  Echo:  06/25/12:  LVEF 55-60%, lat s' 11.3 09/20/12 EF 60-65% lat s' 11.4   Review of Systems: All pertinent positives and negatives as in HPI, otherwise negative.    Past Medical History  Diagnosis Date  . Breast cancer   . Headache   . Back pain   . 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 [place] fever     Current Outpatient Prescriptions  Medication Sig Dispense Refill  . Alum & Mag Hydroxide-Simeth (MAGIC MOUTHWASH) SOLN Take 15 mLs by mouth 3 (three) times daily as needed.  240 mL  3  . amoxicillin (AMOXIL) 500 MG capsule Take 1 capsule (500 mg total) by mouth 3 (three) times daily.  30 capsule  11  . dexamethasone (DECADRON) 4 MG tablet Take 2 tablets (8 mg total) by mouth 2 (two) times daily with a meal. Take two times a day the day before Taxotere. Then take two times a day starting the day after chemo for 3 days.  30 tablet  4  . lidocaine-prilocaine (EMLA) cream Apply topically as needed.  30 g  7  . LORazepam (ATIVAN) 0.5 MG tablet  TAKE 1 TABLET BY MOUTH EVERY 6 HOURS AS NEEDED FOR NAUSEA/VOMITING  30 tablet  0  . naproxen sodium (ANAPROX) 220 MG tablet Take 220 mg by mouth 2 (two) times daily with a meal.      . omeprazole (PRILOSEC) 40 MG capsule Take 1 capsule (40 mg total) by mouth daily.  30 capsule  2  . ondansetron (ZOFRAN) 8 MG tablet Take 1 tablet (8 mg total) by mouth 2 (two) times daily. Take two times a day starting the day after chemo for 3 days. Then take two times a day as needed for nausea or vomiting.  30 tablet  1  . prochlorperazine (COMPAZINE) 10 MG tablet Take 1 tablet (10 mg total) by mouth every 6 (six) hours as needed (Nausea or vomiting).  30 tablet  1  . valACYclovir (VALTREX) 500 MG tablet       . albuterol (VENTOLIN HFA) 108 (90 BASE) MCG/ACT inhaler Inhale 2 puffs into the lungs every 6 (six) hours as needed.        Marland Kitchen azithromycin (ZITHROMAX) 250 MG tablet Take 2 tabs on day 1, then 1 tab daily until complete  6 each  0   No current facility-administered medications for this encounter.    Allergies  Allergen Reactions  .  Aspirin     Unknown reaction  . Iodinated Diagnostic Agents Hives    Pt states in [place] got hives and itching with IV contrast. Per pt allergy was to prev CT.    History   Social History  . Marital Status: Married    Spouse Name: N/A    Number of Children: 2  . Years of Education: N/A   Occupational History  . Property Manager    Social History Main Topics  . Smoking status: Current Every Day Smoker -- 0.50 packs/day for 24 years    Types: Cigarettes  . Smokeless tobacco: Never Used  . Alcohol Use: No  . Drug Use: No  . Sexually Active: Yes     Comment: trying to quit   Other Topics Concern  . Not on file   Social History Narrative  . No narrative on file     PHYSICAL EXAM: Filed Vitals:   09/20/12 0949  BP: 124/86  Pulse: 119  Weight: 116 lb 6.4 oz (52.799 kg)  SpO2: 100%    General:  Well appearing. No respiratory  difficulty HEENT: normal Neck: supple. no JVD. Carotids 2+ bilat; no bruits. No lymphadenopathy or thryomegaly appreciated. Cor: PMI nondisplaced. Regular rate & rhythm. No rubs, gallops or murmurs. Lungs: coarse breath sounds Abdomen: soft, nontender, nondistended. No hepatosplenomegaly. No bruits or masses. Good bowel sounds. Extremities: no cyanosis, clubbing, rash, edema Neuro: alert & oriented x 3, cranial nerves grossly intact. moves all 4 extremities w/o difficulty. Affect pleasant.  EKG: NSR 91 bpm no acute changes   ASSESSMENT & PLAN:

## 2012-09-21 NOTE — Addendum Note (Signed)
Encounter addended by: Merlene Pulling, CCT on: 09/21/2012 10:34 AM<BR>     Documentation filed: Charges VN

## 2012-09-27 ENCOUNTER — Ambulatory Visit (HOSPITAL_BASED_OUTPATIENT_CLINIC_OR_DEPARTMENT_OTHER): Payer: BC Managed Care – PPO

## 2012-09-27 ENCOUNTER — Encounter: Payer: Self-pay | Admitting: Adult Health

## 2012-09-27 ENCOUNTER — Other Ambulatory Visit: Payer: BC Managed Care – PPO | Admitting: Lab

## 2012-09-27 ENCOUNTER — Ambulatory Visit: Payer: BC Managed Care – PPO | Admitting: Lab

## 2012-09-27 ENCOUNTER — Other Ambulatory Visit (HOSPITAL_BASED_OUTPATIENT_CLINIC_OR_DEPARTMENT_OTHER): Payer: BC Managed Care – PPO

## 2012-09-27 ENCOUNTER — Encounter (HOSPITAL_COMMUNITY)
Admission: RE | Admit: 2012-09-27 | Discharge: 2012-09-27 | Disposition: A | Discharge status: Home / Self Care | Payer: BC Managed Care – PPO | Source: Ambulatory Visit | Attending: Oncology | Admitting: Oncology

## 2012-09-27 ENCOUNTER — Ambulatory Visit: Payer: BC Managed Care – PPO | Admitting: Adult Health

## 2012-09-27 ENCOUNTER — Ambulatory Visit (HOSPITAL_BASED_OUTPATIENT_CLINIC_OR_DEPARTMENT_OTHER): Payer: BC Managed Care – PPO | Admitting: Adult Health

## 2012-09-27 VITALS — BP 102/66 | HR 86 | Temp 98.5°F | Resp 20 | Ht 62.5 in | Wt 122.8 lb

## 2012-09-27 VITALS — BP 94/65 | HR 80 | Temp 98.5°F | Resp 19

## 2012-09-27 DIAGNOSIS — C50919 Malignant neoplasm of unspecified site of unspecified female breast: Secondary | ICD-10-CM

## 2012-09-27 DIAGNOSIS — Z5112 Encounter for antineoplastic immunotherapy: Secondary | ICD-10-CM

## 2012-09-27 DIAGNOSIS — D649 Anemia, unspecified: Secondary | ICD-10-CM | POA: Insufficient documentation

## 2012-09-27 DIAGNOSIS — C50219 Malignant neoplasm of upper-inner quadrant of unspecified female breast: Secondary | ICD-10-CM

## 2012-09-27 LAB — CBC WITH DIFFERENTIAL/PLATELET
Eosinophils Absolute: 0 10*3/uL (ref 0.0–0.5)
MONO#: 0.5 10*3/uL (ref 0.1–0.9)
NEUT#: 4.9 10*3/uL (ref 1.5–6.5)
RBC: 2.02 10*6/uL — ABNORMAL LOW (ref 3.70–5.45)
RDW: 21.3 % — ABNORMAL HIGH (ref 11.2–14.5)
WBC: 7.6 10*3/uL (ref 3.9–10.3)
lymph#: 2.1 10*3/uL (ref 0.9–3.3)
nRBC: 0 % (ref 0–0)

## 2012-09-27 LAB — COMPREHENSIVE METABOLIC PANEL (CC13)
ALT: 22 U/L (ref 0–55)
CO2: 27 mEq/L (ref 22–29)
Calcium: 8.7 mg/dL (ref 8.4–10.4)
Chloride: 103 mEq/L (ref 98–107)
Sodium: 139 mEq/L (ref 136–145)
Total Protein: 6.1 g/dL — ABNORMAL LOW (ref 6.4–8.3)

## 2012-09-27 LAB — PREPARE RBC (CROSSMATCH)

## 2012-09-27 MED ORDER — TRASTUZUMAB CHEMO INJECTION 440 MG
2.0000 mg/kg | Freq: Once | INTRAVENOUS | Status: AC
  Administered 2012-09-27: 105 mg via INTRAVENOUS
  Filled 2012-09-27: qty 5

## 2012-09-27 MED ORDER — ACETAMINOPHEN 325 MG PO TABS
650.0000 mg | ORAL_TABLET | Freq: Once | ORAL | Status: AC
  Administered 2012-09-27: 650 mg via ORAL

## 2012-09-27 MED ORDER — DIPHENHYDRAMINE HCL 25 MG PO CAPS
50.0000 mg | ORAL_CAPSULE | Freq: Once | ORAL | Status: AC
  Administered 2012-09-27: 50 mg via ORAL

## 2012-09-27 MED ORDER — SODIUM CHLORIDE 0.9 % IV SOLN
Freq: Once | INTRAVENOUS | Status: AC
  Administered 2012-09-27: 15:00:00 via INTRAVENOUS

## 2012-09-27 NOTE — Progress Notes (Signed)
OFFICE PROGRESS NOTE  CC  PCP: [name], MD   [name], MD [address] [place] [address] [place] [address]  DIAGNOSIS: 47 year old female with premenopausal ER positive PR positive HER-2/neu positive invasive ductal carcinoma of the left breast clinical stage IIA diagnosed in November 2013.  PRIOR THERAPY:  #1 patient was originally seen in the multidisciplinary breast clinic when she found a left breast mass she eventually had MRI and mammograms performed. She was diagnosed with invasive ductal carcinoma that was ER positive PR positive HER-2/neu positive measuring 3.1 cm by MRI criteria. Ki-67 was 70% HER-2 was amplified with a ratio 2.91.  #2 patient was seen at the multidisciplinary breast clinic and a recommendation for neoadjuvant chemotherapy was recommended as patient was interested in breast conservation. She will receive neoadjuvant Taxotere carboplatinum Herceptin. Taxotere carboplatinum will be given every 21 days with weekly Herceptin. Total of 6 cycles of Taxotere and carboplatinum will be administered.  Her subsequent history is as detailed below  CURRENT THERAPY: Cycle 4 Day 15 TCH with Neulasta support, weekly Herceptin  INTERVAL HISTORY: Melissa [name] is doing well today.  She is very anemic.  She is doing well otherwise.  She denies fever, chills,numbness, tingling, diarrhea, constipation, or any other concerns.  Yesterday she did notice she was out of breath with walking.  Otherwise, a 10 point ROS is neg.   Past Medical History  Diagnosis Date  . Breast cancer   . Headache   . Back pain   . 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 [place] fever     ALLERGIES:  is allergic to aspirin and iodinated diagnostic agents.  MEDICATIONS:  Current Outpatient Prescriptions  Medication Sig Dispense Refill  . albuterol (VENTOLIN HFA) 108 (90 BASE) MCG/ACT inhaler Inhale 2 puffs into the lungs every 6 (six) hours  as needed.        Marland Kitchen amoxicillin (AMOXIL) 500 MG capsule Take 1 capsule (500 mg total) by mouth 3 (three) times daily.  30 capsule  11  . dexamethasone (DECADRON) 4 MG tablet Take 2 tablets (8 mg total) by mouth 2 (two) times daily with a meal. Take two times a day the day before Taxotere. Then take two times a day starting the day after chemo for 3 days.  30 tablet  4  . DPH-Lido-AlHydr-MgHydr-Simeth (FIRST-MOUTHWASH BLM) SUSP Take 5 mLs by mouth 4 (four) times daily.      Marland Kitchen lidocaine-prilocaine (EMLA) cream Apply topically as needed.  30 g  7  . LORazepam (ATIVAN) 0.5 MG tablet TAKE 1 TABLET BY MOUTH EVERY 6 HOURS AS NEEDED FOR NAUSEA/VOMITING  30 tablet  0  . naproxen sodium (ANAPROX) 220 MG tablet Take 220 mg by mouth 2 (two) times daily with a meal.      . omeprazole (PRILOSEC) 40 MG capsule Take 1 capsule (40 mg total) by mouth daily.  30 capsule  2  . ondansetron (ZOFRAN) 8 MG tablet Take 1 tablet (8 mg total) by mouth 2 (two) times daily. Take two times a day starting the day after chemo for 3 days. Then take two times a day as needed for nausea or vomiting.  30 tablet  1  . prochlorperazine (COMPAZINE) 10 MG tablet Take 1 tablet (10 mg total) by mouth every 6 (six) hours as needed (Nausea or vomiting).  30 tablet  1  . Alum & Mag Hydroxide-Simeth (MAGIC MOUTHWASH) SOLN Take 15 mLs  by mouth 3 (three) times daily as needed.  240 mL  3  . ciprofloxacin (CIPRO) 500 MG tablet Take 1 tablet (500 mg total) by mouth 2 (two) times daily.  14 tablet  3  . valACYclovir (VALTREX) 500 MG tablet        No current facility-administered medications for this visit.    SURGICAL HISTORY:  Past Surgical History  Procedure Laterality Date  . 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 Cesarean section  1995/1996  . Lobectomy  1997    upper left  . Portacath placement  07/02/2012    Procedure: INSERTION PORT-A-CATH;  Surgeon: Mariella Saa, MD;  Location: [facility];  Service: General;  Laterality: N/A;  right    REVIEW OF SYSTEMS:   General: fatigue (-), night sweats (-), fever (-), pain (-) Lymph: palpable nodes (-) HEENT: vision changes (-), mucositis (-), gum bleeding (-), epistaxis (-) Cardiovascular: chest pain (-), palpitations (-) Pulmonary: shortness of breath (-), dyspnea on exertion (+), cough (-), hemoptysis (-) GI:  Early satiety (-), melena (-), dysphagia (-), nausea/vomiting (-), diarrhea (-) GU: dysuria (-), hematuria (-), incontinence (-) Musculoskeletal: joint swelling (-), joint pain (-), back pain (-) Neuro: weakness (-), numbness (+), headache (-), confusion (-) Skin: Rash (-), lesions (-), dryness (-) Psych: depression (-), suicidal/homicidal ideation (-), feeling of hopelessness (-)   PHYSICAL EXAMINATION: Blood pressure 102/66, pulse 86, temperature 98.5 F (36.9 C), temperature source Oral, resp. rate 20, height 5' 2.5" (1.588 m), weight 122 lb 12.8 oz (55.702 kg). Body mass index is 22.09 kg/(m^2). General: Patient is a well appearing female in no acute distress HEENT: PERRLA, sclerae anicteric no conjunctival pallor, MMM, +frontal sinus tenderness Neck: supple, no palpable adenopathy Lungs: mild wheezing in the left lung.  Right lung clear to auscultation Cardiovascular: regular rate rhythm, S1, S2, no murmurs, rubs or gallops Abdomen: Soft, non-tender, non-distended, normoactive bowel sounds, no HSM Extremities: warm and well perfused, no clubbing, cyanosis, or edema Skin: No rashes or lesions Neuro: Non-focal Breasts: The right breast is unremarkable. In the left breast in the superior lateral aspect up of the areola there is still a palpable mass measuring perhaps 3 cm. It is relatively soft. There is no erythema or other skin change and no nipple retraction. The left axilla was clear ECOG PERFORMANCE STATUS: 1  LABORATORY DATA: Lab Results  Component Value Date   WBC 7.6 09/27/2012   HGB 6.8* 09/27/2012    HCT 20.1* 09/27/2012   MCV 99.5 09/27/2012   PLT 84* 09/27/2012      Chemistry      Component Value Date/Time   NA 139 09/27/2012 1236   NA 141 01/26/2011 1039   K 3.4* 09/27/2012 1236   K 4.5 01/26/2011 1039   CL 103 09/27/2012 1236   CL 106 01/26/2011 1039   CO2 27 09/27/2012 1236   CO2 28 01/26/2011 1039   BUN 8.0 09/27/2012 1236   BUN 7 01/26/2011 1039   CREATININE 0.8 09/27/2012 1236   CREATININE 0.6 01/26/2011 1039      Component Value Date/Time   CALCIUM 8.7 09/27/2012 1236   CALCIUM 9.2 01/26/2011 1039   ALKPHOS 79 09/27/2012 1236   AST 18 09/27/2012 1236   ALT 22 09/27/2012 1236   BILITOT <0.20 Repeated and Verified 09/27/2012 1236       RADIOGRAPHIC STUDIES: Dg Chest 2 View  07/30/2012  *RADIOLOGY REPORT*  Clinical Data:  Bronchiectasis.  History of breast cancer.  CHEST - 2 VIEW  Comparison: 07/02/2012 and 05/03/2011  Findings: Power port in place.  There is now an ill-defined 16 mm in density in the left lung base which correlates with the previous cystic lesion seen on CT scan of 01/26/2011.  There is central lucency.  I suspect this represents retraction and wall thickening of the previously seen area of cystic bronchiectasis.  Scarring at the left apex medially due to cystic bronchiectasis appears slightly less prominent.  The right lung is clear.  Heart size is normal.  Previous left upper lobectomy.  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  No other change since the prior exam.   Original Report Authenticated By: Francene Boyers, M.D.      ASSESSMENT: 47 year old [place] woman  with  #1 a clinical stage IIA (3.1 cm) invasive ductal carcinoma of the left breast status post needle core biopsy 06/05/2012 that showed ER positive PR positive HER-2/neu positive breast cancer with a Ki-67 at 70%.   #2 started chemotherapy on 07/13/2011 consisting of carboplatin, docetaxel and trastuzumab every 3 weeks, with the  trastuzumab given weekly  #3 trastuzumab to be continued to complete a year;  most recent Echo 06/25/2012   #4 Neuropathy  #5 anemia   PLAN:   1. Melissa [name] is feeling well today. She will proceed with Herceptin. She is anemic and will receive 2 units of PRBCs.  Her WBC and plts have improved.   2. I will see her back next week for her next chemotherapy.  3.  She will continue to take Super B complex daily for the numbness, currently it is stable  All questions were answered. The patient knows to call the clinic with any problems, questions or concerns. We can certainly see the patient much sooner if necessary.   I spent 25 minutes counseling the patient face to face. The total time spent in the appointment was 30 minutes.  Cherie Ouch Lyn Hollingshead, NP Medical Oncology [facility] Phone: [phone] 09/27/2012, 4:30 PM

## 2012-09-27 NOTE — Patient Instructions (Addendum)
Trastuzumab injection for infusion What is this medicine? TRASTUZUMAB (tras TOO zoo mab) is a monoclonal antibody. It targets a protein called HER2. This protein is found in some stomach and breast cancers. This medicine can stop cancer cell growth. This medicine may be used with other cancer treatments. This medicine may be used for other purposes; ask your health care provider or pharmacist if you have questions. What should I tell my health care provider before I take this medicine? They need to know if you have any of these conditions: -heart disease -heart failure -infection (especially a virus infection such as chickenpox, cold sores, or herpes) -lung or breathing disease, like asthma -recent or ongoing radiation therapy -an unusual or allergic reaction to trastuzumab, benzyl alcohol, or other medications, foods, dyes, or preservatives -pregnant or trying to get pregnant -breast-feeding How should I use this medicine? This drug is given as an infusion into a vein. It is administered in a hospital or clinic by a specially trained health care professional. Talk to your pediatrician regarding the use of this medicine in children. This medicine is not approved for use in children. Overdosage: If you think you have taken too much of this medicine contact a poison control center or emergency room at once. NOTE: This medicine is only for you. Do not share this medicine with others. What if I miss a dose? It is important not to miss a dose. Call your doctor or health care professional if you are unable to keep an appointment. What may interact with this medicine? -cyclophosphamide -doxorubicin -warfarin This list may not describe all possible interactions. Give your health care provider a list of all the medicines, herbs, non-prescription drugs, or dietary supplements you use. Also tell them if you smoke, drink alcohol, or use illegal drugs. Some items may interact with your medicine. What  should I watch for while using this medicine? Visit your doctor for checks on your progress. Report any side effects. Continue your course of treatment even though you feel ill unless your doctor tells you to stop. Call your doctor or health care professional for advice if you get a fever, chills or sore throat, or other symptoms of a cold or flu. Do not treat yourself. Try to avoid being around people who are sick. You may experience fever, chills and shaking during your first infusion. These effects are usually mild and can be treated with other medicines. Report any side effects during the infusion to your health care professional. Fever and chills usually do not happen with later infusions. What side effects may I notice from receiving this medicine? Side effects that you should report to your doctor or other health care professional as soon as possible: -breathing difficulties -chest pain or palpitations -cough -dizziness or fainting -fever or chills, sore throat -skin rash, itching or hives -swelling of the legs or ankles -unusually weak or tired Side effects that usually do not require medical attention (report to your doctor or other health care professional if they continue or are bothersome): -loss of appetite -headache -muscle aches -nausea This list may not describe all possible side effects. Call your doctor for medical advice about side effects. You may report side effects to FDA at 1-800-FDA-1088. Where should I keep my medicine? This drug is given in a hospital or clinic and will not be stored at home. NOTE: This sheet is a summary. It may not cover all possible information. If you have questions about this medicine, talk to your doctor, pharmacist,   or health care provider.  2013, [employer]vier/Gold Standard. (05/01/2009 1:43:15 PM) Blood Transfusion Information WHAT IS A BLOOD TRANSFUSION? A transfusion is the replacement of blood or some of its parts. Blood is made up of  multiple cells which provide different functions.  Red blood cells carry oxygen and are used for blood loss replacement.  White blood cells fight against infection.  Platelets control bleeding.  Plasma helps clot blood.  Other blood products are available for specialized needs, such as hemophilia or other clotting disorders. BEFORE THE TRANSFUSION  Who gives blood for transfusions?   You may be able to donate blood to be used at a later date on yourself (autologous donation).  Relatives can be asked to donate blood. This is generally not any safer than if you have received blood from a stranger. The same precautions are taken to ensure safety when a relative's blood is donated.  Healthy volunteers who are fully evaluated to make sure their blood is safe. This is blood bank blood. Transfusion therapy is the safest it has ever been in the practice of medicine. Before blood is taken from a donor, a complete history is taken to make sure that person has no history of diseases nor engages in risky social behavior (examples are intravenous drug use or sexual activity with multiple partners). The donor's travel history is screened to minimize risk of transmitting infections, such as malaria. The donated blood is tested for signs of infectious diseases, such as HIV and hepatitis. The blood is then tested to be sure it is compatible with you in order to minimize the chance of a transfusion reaction. If you or a relative donates blood, this is often done in anticipation of surgery and is not appropriate for emergency situations. It takes many days to process the donated blood. RISKS AND COMPLICATIONS Although transfusion therapy is very safe and saves many lives, the main dangers of transfusion include:   Getting an infectious disease.  Developing a transfusion reaction. This is an allergic reaction to something in the blood you were given. Every precaution is taken to prevent this. The decision to  have a blood transfusion has been considered carefully by your caregiver before blood is given. Blood is not given unless the benefits outweigh the risks. AFTER THE TRANSFUSION  Right after receiving a blood transfusion, you will usually feel much better and more energetic. This is especially true if your red blood cells have gotten low (anemic). The transfusion raises the level of the red blood cells which carry oxygen, and this usually causes an energy increase.  The nurse administering the transfusion will monitor you carefully for complications. HOME CARE INSTRUCTIONS  No special instructions are needed after a transfusion. You may find your energy is better. Speak with your caregiver about any limitations on activity for underlying diseases you may have. SEEK MEDICAL CARE IF:   Your condition is not improving after your transfusion.  You develop redness or irritation at the intravenous (IV) site. SEEK IMMEDIATE MEDICAL CARE IF:  Any of the following symptoms occur over the next 12 hours:  Shaking chills.  You have a temperature by mouth above 102 F (38.9 C), not controlled by medicine.  Chest, back, or muscle pain.  People around you feel you are not acting correctly or are confused.  Shortness of breath or difficulty breathing.  Dizziness and fainting.  You get a rash or develop hives.  You have a decrease in urine output.  Your urine turns a dark  color or changes to pink, red, or brown. Any of the following symptoms occur over the next 10 days:  You have a temperature by mouth above 102 F (38.9 C), not controlled by medicine.  Shortness of breath.  Weakness after normal activity.  The white part of the eye turns yellow (jaundice).  You have a decrease in the amount of urine or are urinating less often.  Your urine turns a dark color or changes to pink, red, or brown. Document Released: 06/24/2000 Document Revised: 09/19/2011 Document Reviewed:  02/11/2008 [facility] Patient Information 2013 [place], [place].

## 2012-09-27 NOTE — Progress Notes (Signed)
Pt states that she took two tylenol just before she came to infusion. Tylenol held. Only 25 mg of benadryl given due to pt request and driving.

## 2012-09-28 ENCOUNTER — Other Ambulatory Visit: Payer: Self-pay | Admitting: Oncology

## 2012-09-28 ENCOUNTER — Ambulatory Visit (HOSPITAL_BASED_OUTPATIENT_CLINIC_OR_DEPARTMENT_OTHER): Payer: BC Managed Care – PPO

## 2012-09-28 VITALS — BP 96/64 | HR 78 | Temp 98.2°F | Resp 18

## 2012-09-28 DIAGNOSIS — D649 Anemia, unspecified: Secondary | ICD-10-CM

## 2012-09-28 MED ORDER — ACETAMINOPHEN 325 MG PO TABS
650.0000 mg | ORAL_TABLET | Freq: Once | ORAL | Status: AC
  Administered 2012-09-28: 650 mg via ORAL

## 2012-09-28 MED ORDER — DIPHENHYDRAMINE HCL 25 MG PO CAPS
25.0000 mg | ORAL_CAPSULE | Freq: Once | ORAL | Status: AC
  Administered 2012-09-28: 25 mg via ORAL

## 2012-09-28 NOTE — Patient Instructions (Signed)
Blood Transfusion Information WHAT IS A BLOOD TRANSFUSION? A transfusion is the replacement of blood or some of its parts. Blood is made up of multiple cells which provide different functions.  Red blood cells carry oxygen and are used for blood loss replacement.  White blood cells fight against infection.  Platelets control bleeding.  Plasma helps clot blood.  Other blood products are available for specialized needs, such as hemophilia or other clotting disorders. BEFORE THE TRANSFUSION  Who gives blood for transfusions?   You may be able to donate blood to be used at a later date on yourself (autologous donation).  Relatives can be asked to donate blood. This is generally not any safer than if you have received blood from a stranger. The same precautions are taken to ensure safety when a relative's blood is donated.  Healthy volunteers who are fully evaluated to make sure their blood is safe. This is blood bank blood. Transfusion therapy is the safest it has ever been in the practice of medicine. Before blood is taken from a donor, a complete history is taken to make sure that person has no history of diseases nor engages in risky social behavior (examples are intravenous drug use or sexual activity with multiple partners). The donor's travel history is screened to minimize risk of transmitting infections, such as malaria. The donated blood is tested for signs of infectious diseases, such as HIV and hepatitis. The blood is then tested to be sure it is compatible with you in order to minimize the chance of a transfusion reaction. If you or a relative donates blood, this is often done in anticipation of surgery and is not appropriate for emergency situations. It takes many days to process the donated blood. RISKS AND COMPLICATIONS Although transfusion therapy is very safe and saves many lives, the main dangers of transfusion include:   Getting an infectious disease.  Developing a  transfusion reaction. This is an allergic reaction to something in the blood you were given. Every precaution is taken to prevent this. The decision to have a blood transfusion has been considered carefully by your caregiver before blood is given. Blood is not given unless the benefits outweigh the risks. AFTER THE TRANSFUSION  Right after receiving a blood transfusion, you will usually feel much better and more energetic. This is especially true if your red blood cells have gotten low (anemic). The transfusion raises the level of the red blood cells which carry oxygen, and this usually causes an energy increase.  The nurse administering the transfusion will monitor you carefully for complications. HOME CARE INSTRUCTIONS  No special instructions are needed after a transfusion. You may find your energy is better. Speak with your caregiver about any limitations on activity for underlying diseases you may have. SEEK MEDICAL CARE IF:   Your condition is not improving after your transfusion.  You develop redness or irritation at the intravenous (IV) site. SEEK IMMEDIATE MEDICAL CARE IF:  Any of the following symptoms occur over the next 12 hours:  Shaking chills.  You have a temperature by mouth above 102 F (38.9 C), not controlled by medicine.  Chest, back, or muscle pain.  People around you feel you are not acting correctly or are confused.  Shortness of breath or difficulty breathing.  Dizziness and fainting.  You get a rash or develop hives.  You have a decrease in urine output.  Your urine turns a dark color or changes to pink, red, or brown. Any of the following   symptoms occur over the next 10 days:  You have a temperature by mouth above 102 F (38.9 C), not controlled by medicine.  Shortness of breath.  Weakness after normal activity.  The white part of the eye turns yellow (jaundice).  You have a decrease in the amount of urine or are urinating less often.  Your  urine turns a dark color or changes to pink, red, or brown. Document Released: 06/24/2000 Document Revised: 09/19/2011 Document Reviewed: 02/11/2008 [employer] Patient Information 2013 [employer].  

## 2012-09-29 LAB — TYPE AND SCREEN
ABO/RH(D): O POS
Unit division: 0

## 2012-10-04 ENCOUNTER — Ambulatory Visit (HOSPITAL_BASED_OUTPATIENT_CLINIC_OR_DEPARTMENT_OTHER): Payer: BC Managed Care – PPO | Admitting: Adult Health

## 2012-10-04 ENCOUNTER — Encounter: Payer: Self-pay | Admitting: Adult Health

## 2012-10-04 ENCOUNTER — Ambulatory Visit (HOSPITAL_BASED_OUTPATIENT_CLINIC_OR_DEPARTMENT_OTHER): Payer: BC Managed Care – PPO

## 2012-10-04 ENCOUNTER — Other Ambulatory Visit (HOSPITAL_BASED_OUTPATIENT_CLINIC_OR_DEPARTMENT_OTHER): Payer: BC Managed Care – PPO | Admitting: Lab

## 2012-10-04 VITALS — BP 112/71 | HR 91 | Temp 98.2°F | Resp 20 | Ht 62.5 in | Wt 122.3 lb

## 2012-10-04 DIAGNOSIS — Z170 Estrogen receptor positive status [ER+]: Secondary | ICD-10-CM

## 2012-10-04 DIAGNOSIS — C50919 Malignant neoplasm of unspecified site of unspecified female breast: Secondary | ICD-10-CM

## 2012-10-04 DIAGNOSIS — C50219 Malignant neoplasm of upper-inner quadrant of unspecified female breast: Secondary | ICD-10-CM

## 2012-10-04 DIAGNOSIS — G609 Hereditary and idiopathic neuropathy, unspecified: Secondary | ICD-10-CM

## 2012-10-04 DIAGNOSIS — C50212 Malignant neoplasm of upper-inner quadrant of left female breast: Secondary | ICD-10-CM

## 2012-10-04 DIAGNOSIS — J069 Acute upper respiratory infection, unspecified: Secondary | ICD-10-CM

## 2012-10-04 DIAGNOSIS — D649 Anemia, unspecified: Secondary | ICD-10-CM

## 2012-10-04 DIAGNOSIS — Z5112 Encounter for antineoplastic immunotherapy: Secondary | ICD-10-CM

## 2012-10-04 LAB — CBC WITH DIFFERENTIAL/PLATELET
BASO%: 0.1 % (ref 0.0–2.0)
EOS%: 0.1 % (ref 0.0–7.0)
Eosinophils Absolute: 0 10*3/uL (ref 0.0–0.5)
MCH: 32.8 pg (ref 25.1–34.0)
MCHC: 33 g/dL (ref 31.5–36.0)
MCV: 99.4 fL (ref 79.5–101.0)
MONO%: 2.8 % (ref 0.0–14.0)
NEUT#: 8.7 10*3/uL — ABNORMAL HIGH (ref 1.5–6.5)
RBC: 3.29 10*6/uL — ABNORMAL LOW (ref 3.70–5.45)
RDW: 20.6 % — ABNORMAL HIGH (ref 11.2–14.5)
nRBC: 0 % (ref 0–0)

## 2012-10-04 LAB — COMPREHENSIVE METABOLIC PANEL (CC13)
ALT: 19 U/L (ref 0–55)
AST: 16 U/L (ref 5–34)
Alkaline Phosphatase: 83 U/L (ref 40–150)
Creatinine: 0.8 mg/dL (ref 0.6–1.1)
Sodium: 138 mEq/L (ref 136–145)
Total Bilirubin: <0.2 mg/dL (ref 0.20–1.20)
Total Protein: 7.3 g/dL (ref 6.4–8.3)

## 2012-10-04 MED ORDER — DEXAMETHASONE SODIUM PHOSPHATE 4 MG/ML IJ SOLN
20.0000 mg | Freq: Once | INTRAMUSCULAR | Status: AC
  Administered 2012-10-04: 20 mg via INTRAVENOUS

## 2012-10-04 MED ORDER — SODIUM CHLORIDE 0.9 % IV SOLN
75.0000 mg/m2 | Freq: Once | INTRAVENOUS | Status: AC
  Administered 2012-10-04: 110 mg via INTRAVENOUS
  Filled 2012-10-04: qty 11

## 2012-10-04 MED ORDER — DIPHENHYDRAMINE HCL 25 MG PO CAPS
50.0000 mg | ORAL_CAPSULE | Freq: Once | ORAL | Status: AC
  Administered 2012-10-04: 50 mg via ORAL

## 2012-10-04 MED ORDER — ONDANSETRON 16 MG/50ML IVPB (CHCC)
16.0000 mg | Freq: Once | INTRAVENOUS | Status: AC
  Administered 2012-10-04: 16 mg via INTRAVENOUS

## 2012-10-04 MED ORDER — FLUTICASONE PROPIONATE 50 MCG/ACT NA SUSP: 2.0000 | Freq: Every day | NASAL | Status: DC

## 2012-10-04 MED ORDER — AZITHROMYCIN 250 MG PO TABS: ORAL_TABLET | ORAL | Status: DC

## 2012-10-04 MED ORDER — ACETAMINOPHEN 325 MG PO TABS
650.0000 mg | ORAL_TABLET | Freq: Once | ORAL | Status: AC
  Administered 2012-10-04: 650 mg via ORAL

## 2012-10-04 MED ORDER — TRASTUZUMAB CHEMO INJECTION 440 MG
2.0000 mg/kg | Freq: Once | INTRAVENOUS | Status: AC
  Administered 2012-10-04: 105 mg via INTRAVENOUS
  Filled 2012-10-04: qty 5

## 2012-10-04 MED ORDER — SODIUM CHLORIDE 0.9 % IJ SOLN
10.0000 mL | INTRAMUSCULAR | Status: DC | PRN
  Administered 2012-10-04: 10 mL
  Filled 2012-10-04: qty 10

## 2012-10-04 MED ORDER — SODIUM CHLORIDE 0.9 % IV SOLN
680.0000 mg | Freq: Once | INTRAVENOUS | Status: AC
  Administered 2012-10-04: 680 mg via INTRAVENOUS
  Filled 2012-10-04: qty 68

## 2012-10-04 MED ORDER — SODIUM CHLORIDE 0.9 % IV SOLN
Freq: Once | INTRAVENOUS | Status: AC
  Administered 2012-10-04: 13:00:00 via INTRAVENOUS

## 2012-10-04 MED ORDER — HEPARIN SOD (PORK) LOCK FLUSH 100 UNIT/ML IV SOLN
500.0000 [IU] | Freq: Once | INTRAVENOUS | Status: AC | PRN
  Administered 2012-10-04: 500 [IU]
  Filled 2012-10-04: qty 5

## 2012-10-04 NOTE — Patient Instructions (Addendum)
Doing well.  Proceed with chemotherapy.  Please call us if you have any questions or concerns.    

## 2012-10-04 NOTE — Patient Instructions (Addendum)
[facility] Discharge Instructions for Patients Receiving Chemotherapy  Today you received the following chemotherapy agents :  Taxotere,  Carboplatin,  Herceptin. To help prevent nausea and vomiting after your treatment, we encourage you to take your nausea medication as instructed by your physician.    If you develop nausea and vomiting that is not controlled by your nausea medication, call the clinic. If it is after clinic hours your family physician or the after hours number for the clinic or go to the Emergency Department.   BELOW ARE SYMPTOMS THAT SHOULD BE REPORTED IMMEDIATELY:  *FEVER GREATER THAN 100.5 F  *CHILLS WITH OR WITHOUT FEVER  NAUSEA AND VOMITING THAT IS NOT CONTROLLED WITH YOUR NAUSEA MEDICATION  *UNUSUAL SHORTNESS OF BREATH  *UNUSUAL BRUISING OR BLEEDING  TENDERNESS IN MOUTH AND THROAT WITH OR WITHOUT PRESENCE OF ULCERS  *URINARY PROBLEMS  *BOWEL PROBLEMS  UNUSUAL RASH Items with * indicate a potential emergency and should be followed up as soon as possible.  One of the nurses will contact you 24 hours after your treatment. Please let the nurse know about any problems that you may have experienced. Feel free to call the clinic you have any questions or concerns. The clinic phone number is [phone].   I have been informed and understand all the instructions given to me. I know to contact the clinic, my physician, or go to the Emergency Department if any problems should occur. I do not have any questions at this time, but understand that I may call the clinic during office hours   should I have any questions or need assistance in obtaining follow up care.    __________________________________________  _____________  __________ Signature of Patient or Authorized Representative            Date                   Time    __________________________________________ Nurse's Signature    

## 2012-10-04 NOTE — Progress Notes (Signed)
OFFICE PROGRESS NOTE  CC  PCP: [name], MD   [name], MD 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  PRIOR THERAPY:  #1 patient was originally seen in the multidisciplinary breast clinic when she found a left breast mass she eventually had MRI and mammograms performed. She was diagnosed with invasive ductal carcinoma that was ER positive PR positive HER-2/neu positive measuring 3.1 cm by MRI criteria. Ki-67 was 70% HER-2 was amplified with a ratio 2.91.  #2 patient was seen at the multidisciplinary breast clinic and a recommendation for neoadjuvant chemotherapy was recommended as patient was interested in breast conservation. She will receive neoadjuvant Taxotere carboplatinum Herceptin. Taxotere carboplatinum will be given every 21 days with weekly Herceptin. Total of 6 cycles of Taxotere and carboplatinum will be administered.  Her subsequent history is as detailed below  CURRENT THERAPY: Cycle 5 Day 1 TCH with Neulasta support, weekly Herceptin  INTERVAL HISTORY: Ms. Tyminski is doing well today.  Her anemia is much improved.  She does have a minor cold with nasal drainage and cough.  She denies fevers, chills, nausea,vomiting, constipation, diarrhea.  The numbness is stable.   Past Medical History  Diagnosis Date  . Breast cancer   . Headache   . Back pain   . 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 [place] fever     ALLERGIES:  is allergic to aspirin and iodinated diagnostic agents.  MEDICATIONS:  Current Outpatient Prescriptions  Medication Sig Dispense Refill  . albuterol (VENTOLIN HFA) 108 (90 BASE) MCG/ACT inhaler Inhale 2 puffs into the lungs every 6 (six) hours as needed.        . Alum & Mag Hydroxide-Simeth (MAGIC  MOUTHWASH) SOLN Take 15 mLs by mouth 3 (three) times daily as needed.  240 mL  3  . amoxicillin (AMOXIL) 500 MG capsule Take 1 capsule (500 mg total) by mouth 3 (three) times daily.  30 capsule  11  . dexamethasone (DECADRON) 4 MG tablet Take 2 tablets (8 mg total) by mouth 2 (two) times daily with a meal. Take two times a day the day before Taxotere. Then take two times a day starting the day after chemo for 3 days.  30 tablet  4  . lidocaine-prilocaine (EMLA) cream Apply topically as needed.  30 g  7  . LORazepam (ATIVAN) 0.5 MG tablet TAKE 1 TABLET BY MOUTH EVERY 6 HOURS AS NEEDED FOR NAUSEA/VOMITING  30 tablet  0  . naproxen sodium (ANAPROX) 220 MG tablet Take 220 mg by mouth 2 (two) times daily with a meal.      . omeprazole (PRILOSEC) 40 MG capsule Take 1 capsule (40 mg total) by mouth daily.  30 capsule  2  . ondansetron (ZOFRAN) 8 MG tablet Take 1 tablet (8 mg total) by mouth 2 (two) times daily. Take two times a day starting the day after chemo for 3 days. Then take two times a day as needed for nausea or vomiting.  30 tablet  1  . prochlorperazine (COMPAZINE) 10 MG tablet Take 1 tablet (10 mg total) by mouth every 6 (six) hours as needed (Nausea or vomiting).  30 tablet  1  . valACYclovir (VALTREX) 500 MG tablet        No current facility-administered medications  for this visit.    SURGICAL HISTORY:  Past Surgical History  Procedure Laterality Date  . 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 Cesarean section  1995/1996  . Lobectomy  1997    upper left  . Portacath placement  07/02/2012    Procedure: INSERTION PORT-A-CATH;  Surgeon: Mariella Saa, MD;  Location:  SURGERY CENTER;  Service: General;  Laterality: N/A;  right    REVIEW OF SYSTEMS:   General: fatigue (-), night sweats (-), fever (-), pain (-) Lymph: palpable nodes (-) HEENT: vision changes (-), mucositis (-), gum bleeding (-), epistaxis (-) Cardiovascular: chest pain (-),  palpitations (-) Pulmonary: shortness of breath (-), dyspnea on exertion (+), cough (-), hemoptysis (-) GI:  Early satiety (-), melena (-), dysphagia (-), nausea/vomiting (-), diarrhea (-) GU: dysuria (-), hematuria (-), incontinence (-) Musculoskeletal: joint swelling (-), joint pain (-), back pain (-) Neuro: weakness (-), numbness (+), headache (-), confusion (-) Skin: Rash (-), lesions (-), dryness (-) Psych: depression (-), suicidal/homicidal ideation (-), feeling of hopelessness (-)   PHYSICAL EXAMINATION: Blood pressure 112/71, pulse 91, temperature 98.2 F (36.8 C), temperature source Oral, resp. rate 20, height 5' 2.5" (1.588 m), weight 122 lb 4.8 oz (55.475 kg). Body mass index is 22 kg/(m^2). General: Patient is a well appearing female in no acute distress HEENT: PERRLA, sclerae anicteric no conjunctival pallor, MMM, +frontal sinus tenderness Neck: supple, no palpable adenopathy Lungs: mild wheezing in the left lung.  Right lung clear to auscultation Cardiovascular: regular rate rhythm, S1, S2, no murmurs, rubs or gallops Abdomen: Soft, non-tender, non-distended, normoactive bowel sounds, no HSM Extremities: warm and well perfused, no clubbing, cyanosis, or edema Skin: No rashes or lesions Neuro: Non-focal Breasts: The right breast is unremarkable. In the left breast in the superior lateral aspect up of the areola there is still a palpable mass measuring perhaps 3 cm. It is relatively soft. There is no erythema or other skin change and no nipple retraction. The left axilla was clear ECOG PERFORMANCE STATUS: 1  LABORATORY DATA: Lab Results  Component Value Date   WBC 10.1 10/04/2012   HGB 10.8* 10/04/2012   HCT 32.7* 10/04/2012   MCV 99.4 10/04/2012   PLT 471* 10/04/2012      Chemistry      Component Value Date/Time   NA 139 09/27/2012 1236   NA 141 01/26/2011 1039   K 3.4* 09/27/2012 1236   K 4.5 01/26/2011 1039   CL 103 09/27/2012 1236   CL 106 01/26/2011 1039   CO2 27  09/27/2012 1236   CO2 28 01/26/2011 1039   BUN 8.0 09/27/2012 1236   BUN 7 01/26/2011 1039   CREATININE 0.8 09/27/2012 1236   CREATININE 0.6 01/26/2011 1039      Component Value Date/Time   CALCIUM 8.7 09/27/2012 1236   CALCIUM 9.2 01/26/2011 1039   ALKPHOS 79 09/27/2012 1236   AST 18 09/27/2012 1236   ALT 22 09/27/2012 1236   BILITOT <0.20 Repeated and Verified 09/27/2012 1236       RADIOGRAPHIC STUDIES: Dg Chest 2 View  07/30/2012  *RADIOLOGY REPORT*  Clinical Data: Bronchiectasis.  History of breast cancer.  CHEST - 2 VIEW  Comparison: 07/02/2012 and 05/03/2011  Findings: Power port in place.  There is now an ill-defined 16 mm in density in the left lung base which correlates with the previous cystic lesion seen on CT scan of 01/26/2011.  There is central lucency.  I suspect this represents retraction and wall thickening of the previously seen area of cystic bronchiectasis.  Scarring at the left apex medially due to cystic bronchiectasis appears slightly less prominent.  The right lung is clear.  Heart size is normal.  Previous left upper lobectomy.  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  No other change since the prior exam.   Original Report Authenticated By: Francene Boyers, M.D.      ASSESSMENT: 47 year old [place] woman  with  #1 a clinical stage IIA (3.1 cm) invasive ductal carcinoma of the left breast status post needle core biopsy 06/05/2012 that showed ER positive PR positive HER-2/neu positive breast cancer with a Ki-67 at 70%.   #2 started chemotherapy on 07/13/2011 consisting of carboplatin, docetaxel and trastuzumab every 3 weeks, with the trastuzumab given weekly  #3 trastuzumab to be continued to complete a year;  most recent Echo 06/25/2012   #4 Neuropathy  #5 anemia   PLAN:   1. Ms. Bergeman is feeling well today. She will proceed with chemotherapy today.  Her labs are much improved.    2. I will see  her back next week for weekly Herceptin.  3.  She will continue to take Super B complex daily for the numbness, currently it is stable  All questions were answered. The patient knows to call the clinic with any problems, questions or concerns. We can certainly see the patient much sooner if necessary.   I spent 25 minutes counseling the patient face to face. The total time spent in the appointment was 30 minutes.  Cherie Ouch Lyn Hollingshead, NP Medical Oncology [facility] Phone: [phone] 10/04/2012, 12:27 PM

## 2012-10-05 ENCOUNTER — Ambulatory Visit (HOSPITAL_BASED_OUTPATIENT_CLINIC_OR_DEPARTMENT_OTHER): Payer: BC Managed Care – PPO

## 2012-10-05 VITALS — BP 116/72 | HR 90 | Temp 98.5°F

## 2012-10-05 DIAGNOSIS — C50919 Malignant neoplasm of unspecified site of unspecified female breast: Secondary | ICD-10-CM

## 2012-10-05 DIAGNOSIS — C50219 Malignant neoplasm of upper-inner quadrant of unspecified female breast: Secondary | ICD-10-CM

## 2012-10-05 DIAGNOSIS — Z5189 Encounter for other specified aftercare: Secondary | ICD-10-CM

## 2012-10-05 MED ORDER — PEGFILGRASTIM INJECTION 6 MG/0.6ML
6.0000 mg | Freq: Once | SUBCUTANEOUS | Status: AC
  Administered 2012-10-05: 6 mg via SUBCUTANEOUS
  Filled 2012-10-05: qty 0.6

## 2012-10-11 ENCOUNTER — Ambulatory Visit (HOSPITAL_BASED_OUTPATIENT_CLINIC_OR_DEPARTMENT_OTHER): Payer: BC Managed Care – PPO | Admitting: Adult Health

## 2012-10-11 ENCOUNTER — Other Ambulatory Visit (HOSPITAL_BASED_OUTPATIENT_CLINIC_OR_DEPARTMENT_OTHER): Payer: BC Managed Care – PPO | Admitting: Lab

## 2012-10-11 ENCOUNTER — Ambulatory Visit (HOSPITAL_BASED_OUTPATIENT_CLINIC_OR_DEPARTMENT_OTHER): Payer: BC Managed Care – PPO

## 2012-10-11 ENCOUNTER — Telehealth: Payer: Self-pay | Admitting: Adult Health

## 2012-10-11 ENCOUNTER — Encounter: Payer: Self-pay | Admitting: Adult Health

## 2012-10-11 VITALS — BP 121/76 | HR 92 | Temp 98.2°F | Resp 20 | Ht 62.5 in | Wt 118.6 lb

## 2012-10-11 DIAGNOSIS — Z170 Estrogen receptor positive status [ER+]: Secondary | ICD-10-CM

## 2012-10-11 DIAGNOSIS — E876 Hypokalemia: Secondary | ICD-10-CM

## 2012-10-11 DIAGNOSIS — C50219 Malignant neoplasm of upper-inner quadrant of unspecified female breast: Secondary | ICD-10-CM

## 2012-10-11 DIAGNOSIS — Z5112 Encounter for antineoplastic immunotherapy: Secondary | ICD-10-CM

## 2012-10-11 DIAGNOSIS — C50919 Malignant neoplasm of unspecified site of unspecified female breast: Secondary | ICD-10-CM

## 2012-10-11 DIAGNOSIS — K121 Other forms of stomatitis: Secondary | ICD-10-CM

## 2012-10-11 DIAGNOSIS — K1230 Oral mucositis (ulcerative), unspecified: Secondary | ICD-10-CM

## 2012-10-11 DIAGNOSIS — C50212 Malignant neoplasm of upper-inner quadrant of left female breast: Secondary | ICD-10-CM

## 2012-10-11 DIAGNOSIS — R209 Unspecified disturbances of skin sensation: Secondary | ICD-10-CM

## 2012-10-11 DIAGNOSIS — E860 Dehydration: Secondary | ICD-10-CM

## 2012-10-11 LAB — CBC WITH DIFFERENTIAL/PLATELET
BASO%: 0.3 % (ref 0.0–2.0)
Eosinophils Absolute: 0 10*3/uL (ref 0.0–0.5)
HCT: 32.3 % — ABNORMAL LOW (ref 34.8–46.6)
LYMPH%: 27.5 % (ref 14.0–49.7)
MCHC: 34.1 g/dL (ref 31.5–36.0)
MCV: 97 fL (ref 79.5–101.0)
MONO%: 30 % — ABNORMAL HIGH (ref 0.0–14.0)
NEUT%: 42 % (ref 38.4–76.8)
Platelets: 352 10*3/uL (ref 145–400)
RBC: 3.33 10*6/uL — ABNORMAL LOW (ref 3.70–5.45)
nRBC: 0 % (ref 0–0)

## 2012-10-11 LAB — COMPREHENSIVE METABOLIC PANEL (CC13)
ALT: 22 U/L (ref 0–55)
BUN: 9 mg/dL (ref 7.0–26.0)
CO2: 28 mEq/L (ref 22–29)
Calcium: 9.7 mg/dL (ref 8.4–10.4)
Chloride: 97 mEq/L — ABNORMAL LOW (ref 98–107)
Creatinine: 0.8 mg/dL (ref 0.6–1.1)
Total Bilirubin: 0.35 mg/dL (ref 0.20–1.20)

## 2012-10-11 MED ORDER — SODIUM CHLORIDE 0.9 % IV SOLN: Freq: Once | INTRAVENOUS | Status: DC

## 2012-10-11 MED ORDER — SODIUM CHLORIDE 0.9 % IV SOLN
Freq: Once | INTRAVENOUS | Status: AC
  Administered 2012-10-11: 11:00:00 via INTRAVENOUS

## 2012-10-11 MED ORDER — ACETAMINOPHEN 325 MG PO TABS
650.0000 mg | ORAL_TABLET | Freq: Once | ORAL | Status: AC
  Administered 2012-10-11: 650 mg via ORAL

## 2012-10-11 MED ORDER — HEPARIN SOD (PORK) LOCK FLUSH 100 UNIT/ML IV SOLN
500.0000 [IU] | Freq: Once | INTRAVENOUS | Status: AC | PRN
  Administered 2012-10-11: 500 [IU]
  Filled 2012-10-11: qty 5

## 2012-10-11 MED ORDER — POTASSIUM CHLORIDE ER 10 MEQ PO TBCR: 10.0000 meq | EXTENDED_RELEASE_TABLET | Freq: Two times a day (BID) | ORAL | Status: DC

## 2012-10-11 MED ORDER — TRASTUZUMAB CHEMO INJECTION 440 MG
2.0000 mg/kg | Freq: Once | INTRAVENOUS | Status: AC
  Administered 2012-10-11: 105 mg via INTRAVENOUS
  Filled 2012-10-11: qty 5

## 2012-10-11 MED ORDER — SODIUM CHLORIDE 0.9 % IJ SOLN
10.0000 mL | INTRAMUSCULAR | Status: DC | PRN
  Administered 2012-10-11: 10 mL
  Filled 2012-10-11: qty 10

## 2012-10-11 MED ORDER — DIPHENHYDRAMINE HCL 25 MG PO CAPS
50.0000 mg | ORAL_CAPSULE | Freq: Once | ORAL | Status: AC
  Administered 2012-10-11: 50 mg via ORAL

## 2012-10-11 NOTE — Telephone Encounter (Signed)
Called patient regarding K level 3.2.  Will take Kdur PO BID for 7 days. Prescription sent to [facility] in [place]. Pt verbalized understanding.

## 2012-10-11 NOTE — Progress Notes (Signed)
OFFICE PROGRESS NOTE  CC  PCP: [name], MD   [name], MD 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  PRIOR THERAPY:  #1 patient was originally seen in the multidisciplinary breast clinic when she found a left breast mass she eventually had MRI and mammograms performed. She was diagnosed with invasive ductal carcinoma that was ER positive PR positive HER-2/neu positive measuring 3.1 cm by MRI criteria. Ki-67 was 70% HER-2 was amplified with a ratio 2.91.  #2 patient was seen at the multidisciplinary breast clinic and a recommendation for neoadjuvant chemotherapy was recommended as patient was interested in breast conservation. She will receive neoadjuvant Taxotere carboplatinum Herceptin. Taxotere carboplatinum will be given every 21 days with weekly Herceptin. Total of 6 cycles of Taxotere and carboplatinum will be administered.  Her subsequent history is as detailed below  CURRENT THERAPY: Cycle 5 Day 8 TCH with Neulasta support, weekly Herceptin  INTERVAL HISTORY: Melissa [name] is doing well today.  This has been a rough week, with taste changes, and she's had to drink fluids and eat pudding, She denies fevers, chills, constipation, diarrhea, nausea, vomiting.  The numbness in her fingertips is unchanged, she has retained her motor function.  Otherwise, a 10 point ROS is neg.   Past Medical History  Diagnosis Date  . Breast cancer   . Headache   . Back pain   . 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 [place] fever     ALLERGIES:  is allergic to aspirin and iodinated diagnostic agents.  MEDICATIONS:  Current Outpatient Prescriptions  Medication Sig Dispense Refill  . albuterol (VENTOLIN HFA) 108 (90 BASE) MCG/ACT inhaler Inhale 2  puffs into the lungs every 6 (six) hours as needed.        Marland Kitchen amoxicillin (AMOXIL) 500 MG capsule Take 1 capsule (500 mg total) by mouth 3 (three) times daily.  30 capsule  11  . dexamethasone (DECADRON) 4 MG tablet Take 2 tablets (8 mg total) by mouth 2 (two) times daily with a meal. Take two times a day the day before Taxotere. Then take two times a day starting the day after chemo for 3 days.  30 tablet  4  . DPH-Lido-AlHydr-MgHydr-Simeth (FIRST-MOUTHWASH BLM) SUSP       . fluticasone (FLONASE) 50 MCG/ACT nasal spray Place 2 sprays into the nose daily.  16 g  2  . lidocaine-prilocaine (EMLA) cream Apply topically as needed.  30 g  7  . LORazepam (ATIVAN) 0.5 MG tablet TAKE 1 TABLET BY MOUTH EVERY 6 HOURS AS NEEDED FOR NAUSEA/VOMITING  30 tablet  0  . naproxen sodium (ANAPROX) 220 MG tablet Take 220 mg by mouth 2 (two) times daily with a meal.      . omeprazole (PRILOSEC) 40 MG capsule Take 1 capsule (40 mg total) by mouth daily.  30 capsule  2  . ondansetron (ZOFRAN) 8 MG tablet Take 1 tablet (8 mg total) by mouth 2 (two) times daily. Take two times a day starting the day after chemo for 3 days. Then take two times a day as needed for nausea or vomiting.  30 tablet  1  . prochlorperazine (COMPAZINE) 10 MG tablet Take 1 tablet (10 mg total) by mouth every 6 (six) hours as needed (Nausea or  vomiting).  30 tablet  1  . valACYclovir (VALTREX) 500 MG tablet        No current facility-administered medications for this visit.    SURGICAL HISTORY:  Past Surgical History  Procedure Laterality Date  . 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 Cesarean section  1995/1996  . Lobectomy  1997    upper left  . Portacath placement  07/02/2012    Procedure: INSERTION PORT-A-CATH;  Surgeon: Mariella Saa, MD;  Location: [facility];  Service: General;  Laterality: N/A;  right    REVIEW OF SYSTEMS:   General: fatigue (-), night sweats (-), fever (-), pain  (-) Lymph: palpable nodes (-) HEENT: vision changes (-), mucositis (+), gum bleeding (-), epistaxis (-) Cardiovascular: chest pain (-), palpitations (-) Pulmonary: shortness of breath (-), dyspnea on exertion (+), cough (-), hemoptysis (-) GI:  Early satiety (-), melena (-), dysphagia (-), nausea/vomiting (-), diarrhea (-) GU: dysuria (-), hematuria (-), incontinence (-) Musculoskeletal: joint swelling (-), joint pain (-), back pain (-) Neuro: weakness (-), numbness (+), headache (-), confusion (-) Skin: Rash (-), lesions (-), dryness (-) Psych: depression (-), suicidal/homicidal ideation (-), feeling of hopelessness (-)   PHYSICAL EXAMINATION: Blood pressure 121/76, pulse 92, temperature 98.2 F (36.8 C), temperature source Oral, resp. rate 20, height 5' 2.5" (1.588 m), weight 118 lb 9.6 oz (53.797 kg). Body mass index is 21.33 kg/(m^2). General: Patient is a well appearing female in no acute distress HEENT: PERRLA, sclerae anicteric no conjunctival pallor, MMM, ulceration in bilateral buccal mucosa, blistering in corners of mouth.   Neck: supple, no palpable adenopathy Lungs: mild wheezing in the left lung.  Right lung clear to auscultation Cardiovascular: regular rate rhythm, S1, S2, no murmurs, rubs or gallops Abdomen: Soft, non-tender, non-distended, normoactive bowel sounds, no HSM Extremities: warm and well perfused, no clubbing, cyanosis, or edema Skin: No rashes or lesions Neuro: Non-focal Breasts: The right breast is unremarkable. In the left breast in the superior lateral aspect up of the areola there is still a palpable mass measuring perhaps 3 cm. It is relatively soft. There is no erythema or other skin change and no nipple retraction. The left axilla was clear ECOG PERFORMANCE STATUS: 1  LABORATORY DATA: Lab Results  Component Value Date   WBC 10.5* 10/11/2012   HGB 11.0* 10/11/2012   HCT 32.3* 10/11/2012   MCV 97.0 10/11/2012   PLT 352 10/11/2012      Chemistry       Component Value Date/Time   NA 138 10/04/2012 1049   NA 141 01/26/2011 1039   K 4.3 10/04/2012 1049   K 4.5 01/26/2011 1039   CL 104 10/04/2012 1049   CL 106 01/26/2011 1039   CO2 25 10/04/2012 1049   CO2 28 01/26/2011 1039   BUN 12.0 10/04/2012 1049   BUN 7 01/26/2011 1039   CREATININE 0.8 10/04/2012 1049   CREATININE 0.6 01/26/2011 1039      Component Value Date/Time   CALCIUM 10.0 10/04/2012 1049   CALCIUM 9.2 01/26/2011 1039   ALKPHOS 83 10/04/2012 1049   AST 16 10/04/2012 1049   ALT 19 10/04/2012 1049   BILITOT <0.20 Repeated and Verified 10/04/2012 1049       RADIOGRAPHIC STUDIES: Dg Chest 2 View  07/30/2012  *RADIOLOGY REPORT*  Clinical Data: Bronchiectasis.  History of breast cancer.  CHEST - 2 VIEW  Comparison: 07/02/2012 and 05/03/2011  Findings: Power port in place.  There is now an ill-defined 16 mm in density in the left lung base which correlates with the previous cystic lesion seen on CT scan of 01/26/2011.  There is central lucency.  I suspect this represents retraction and wall thickening of the previously seen area of cystic bronchiectasis.  Scarring at the left apex medially due to cystic bronchiectasis appears slightly less prominent.  The right lung is clear.  Heart size is normal.  Previous left upper lobectomy.  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  No other change since the prior exam.   Original Report Authenticated By: Francene Boyers, M.D.      ASSESSMENT: 47 year old [place] woman  with  #1 a clinical stage IIA (3.1 cm) invasive ductal carcinoma of the left breast status post needle core biopsy 06/05/2012 that showed ER positive PR positive HER-2/neu positive breast cancer with a Ki-67 at 70%.   #2 started chemotherapy on 07/13/2011 consisting of carboplatin, docetaxel and trastuzumab every 3 weeks, with the trastuzumab given weekly  #3 trastuzumab to be continued to complete a year;  most recent  Echo 06/25/2012   #4 Neuropathy  #5 dehydration  #6 Mucositis   PLAN:   1. Melissa [name] is feeling well today. She will proceed with Herceptin.  She will receive IV fluids today, due to decreased PO intake after TCH and subsequent dehydration.    She has her MRI scheduled on 4/7, Dr. Johna Sheriff f/u on 4/25 and echo and Dr. Gala Romney appt on 5/12.    2. I will see her back next week for weekly Herceptin.  3.  She will continue to take Super B complex daily for the numbness, currently it is stable  4. She does have mucositis and valtrex and magic mouthwash is helping.    All questions were answered. The patient knows to call the clinic with any problems, questions or concerns. We can certainly see the patient much sooner if necessary.   I spent 25 minutes counseling the patient face to face. The total time spent in the appointment was 30 minutes.  Cherie Ouch Lyn Hollingshead, NP Medical Oncology [facility] Phone: [phone] 10/11/2012, 9:50 AM

## 2012-10-11 NOTE — Patient Instructions (Signed)
Doing well.  Proceed with Herceptin.  Please call us if you have any questions or concerns.    

## 2012-10-15 ENCOUNTER — Ambulatory Visit (HOSPITAL_COMMUNITY)
Admission: RE | Admit: 2012-10-15 | Discharge: 2012-10-15 | Disposition: A | Discharge status: Home / Self Care | Payer: BC Managed Care – PPO | Source: Ambulatory Visit | Attending: Adult Health | Admitting: Adult Health

## 2012-10-15 DIAGNOSIS — C50212 Malignant neoplasm of upper-inner quadrant of left female breast: Secondary | ICD-10-CM

## 2012-10-15 DIAGNOSIS — Z9221 Personal history of antineoplastic chemotherapy: Secondary | ICD-10-CM | POA: Insufficient documentation

## 2012-10-15 DIAGNOSIS — C50919 Malignant neoplasm of unspecified site of unspecified female breast: Secondary | ICD-10-CM | POA: Insufficient documentation

## 2012-10-15 MED ORDER — GADOBENATE DIMEGLUMINE 529 MG/ML IV SOLN
10.0000 mL | Freq: Once | INTRAVENOUS | Status: AC | PRN
  Administered 2012-10-15: 10 mL via INTRAVENOUS

## 2012-10-18 ENCOUNTER — Ambulatory Visit (HOSPITAL_BASED_OUTPATIENT_CLINIC_OR_DEPARTMENT_OTHER): Payer: BC Managed Care – PPO

## 2012-10-18 ENCOUNTER — Other Ambulatory Visit (HOSPITAL_BASED_OUTPATIENT_CLINIC_OR_DEPARTMENT_OTHER): Payer: BC Managed Care – PPO | Admitting: Lab

## 2012-10-18 ENCOUNTER — Encounter: Payer: Self-pay | Admitting: Oncology

## 2012-10-18 ENCOUNTER — Ambulatory Visit (HOSPITAL_BASED_OUTPATIENT_CLINIC_OR_DEPARTMENT_OTHER): Payer: BC Managed Care – PPO | Admitting: Oncology

## 2012-10-18 VITALS — BP 130/81 | HR 94 | Temp 98.3°F | Resp 20 | Ht 62.5 in | Wt 123.6 lb

## 2012-10-18 DIAGNOSIS — C50219 Malignant neoplasm of upper-inner quadrant of unspecified female breast: Secondary | ICD-10-CM

## 2012-10-18 DIAGNOSIS — C50919 Malignant neoplasm of unspecified site of unspecified female breast: Secondary | ICD-10-CM

## 2012-10-18 DIAGNOSIS — G609 Hereditary and idiopathic neuropathy, unspecified: Secondary | ICD-10-CM

## 2012-10-18 DIAGNOSIS — D696 Thrombocytopenia, unspecified: Secondary | ICD-10-CM

## 2012-10-18 DIAGNOSIS — Z170 Estrogen receptor positive status [ER+]: Secondary | ICD-10-CM

## 2012-10-18 DIAGNOSIS — C50212 Malignant neoplasm of upper-inner quadrant of left female breast: Secondary | ICD-10-CM

## 2012-10-18 DIAGNOSIS — Z5112 Encounter for antineoplastic immunotherapy: Secondary | ICD-10-CM

## 2012-10-18 LAB — CBC WITH DIFFERENTIAL/PLATELET
Basophils Absolute: 0 10*3/uL (ref 0.0–0.1)
Eosinophils Absolute: 0 10*3/uL (ref 0.0–0.5)
HCT: 29.2 % — ABNORMAL LOW (ref 34.8–46.6)
HGB: 9.7 g/dL — ABNORMAL LOW (ref 11.6–15.9)
MCH: 32.7 pg (ref 25.1–34.0)
MONO#: 1 10*3/uL — ABNORMAL HIGH (ref 0.1–0.9)
NEUT#: 8.7 10*3/uL — ABNORMAL HIGH (ref 1.5–6.5)
NEUT%: 70 % (ref 38.4–76.8)
RDW: 20.3 % — ABNORMAL HIGH (ref 11.2–14.5)
WBC: 12.4 10*3/uL — ABNORMAL HIGH (ref 3.9–10.3)
lymph#: 2.7 10*3/uL (ref 0.9–3.3)

## 2012-10-18 LAB — COMPREHENSIVE METABOLIC PANEL (CC13)
AST: 29 U/L (ref 5–34)
Albumin: 3.2 g/dL — ABNORMAL LOW (ref 3.5–5.0)
BUN: 5.8 mg/dL — ABNORMAL LOW (ref 7.0–26.0)
CO2: 27 mEq/L (ref 22–29)
Calcium: 9.3 mg/dL (ref 8.4–10.4)
Chloride: 104 mEq/L (ref 98–107)
Creatinine: 0.7 mg/dL (ref 0.6–1.1)
Glucose: 90 mg/dl (ref 70–99)
Potassium: 3.4 mEq/L — ABNORMAL LOW (ref 3.5–5.1)

## 2012-10-18 MED ORDER — SODIUM CHLORIDE 0.9 % IV SOLN
Freq: Once | INTRAVENOUS | Status: AC
  Administered 2012-10-18: 13:00:00 via INTRAVENOUS

## 2012-10-18 MED ORDER — SODIUM CHLORIDE 0.9 % IJ SOLN
10.0000 mL | INTRAMUSCULAR | Status: DC | PRN
  Administered 2012-10-18: 10 mL
  Filled 2012-10-18: qty 10

## 2012-10-18 MED ORDER — HEPARIN SOD (PORK) LOCK FLUSH 100 UNIT/ML IV SOLN
500.0000 [IU] | Freq: Once | INTRAVENOUS | Status: AC | PRN
  Administered 2012-10-18: 500 [IU]
  Filled 2012-10-18: qty 5

## 2012-10-18 MED ORDER — ACETAMINOPHEN 325 MG PO TABS
650.0000 mg | ORAL_TABLET | Freq: Once | ORAL | Status: AC
  Administered 2012-10-18: 650 mg via ORAL

## 2012-10-18 MED ORDER — TRASTUZUMAB CHEMO INJECTION 440 MG
2.0000 mg/kg | Freq: Once | INTRAVENOUS | Status: AC
  Administered 2012-10-18: 105 mg via INTRAVENOUS
  Filled 2012-10-18: qty 5

## 2012-10-18 MED ORDER — DIPHENHYDRAMINE HCL 25 MG PO CAPS
50.0000 mg | ORAL_CAPSULE | Freq: Once | ORAL | Status: AC
  Administered 2012-10-18: 50 mg via ORAL

## 2012-10-18 NOTE — Progress Notes (Signed)
OFFICE PROGRESS NOTE  CC  PCP: [name], MD   [name], MD [address] [place] [address]  DIAGNOSIS: 47 year old female with premenopausal ER positive PR positive HER-2/neu positive invasive ductal carcinoma of the left breast clinical stage IIA diagnosed in November 2013.  PRIOR THERAPY:  #1 patient was originally seen in the multidisciplinary breast clinic when she found a left breast mass she eventually had MRI and mammograms performed. She was diagnosed with invasive ductal carcinoma that was ER positive PR positive HER-2/neu positive measuring 3.1 cm by MRI criteria. Ki-67 was 70% HER-2 was amplified with a ratio 2.91.  #2 patient was seen at the multidisciplinary breast clinic and a recommendation for neoadjuvant chemotherapy was recommended as patient was interested in breast conservation. She will receive neoadjuvant Taxotere carboplatinum Herceptin. Taxotere carboplatinum will be given every 21 days with weekly Herceptin. Total of 6 cycles of Taxotere and carboplatinum will be administered.  3 patient had MRI of the breasts performed on 10/15/2012 which does show reduction in the size of the left breast mass from 3.1to 1.2. This is a wonderful response.  CURRENT THERAPY:  weekly Herceptin  INTERVAL HISTORY: Patient returns in followup visit prior to her Herceptin today. Clinically she looks better. Her be decided [name] and herpetic eruptions have resolved. She is taking Valtrex. She and I discussed her results of her MRI. I did show her that the tumor has shrunk significantly from 3.1 down to 1.2. This is a good response. She otherwise feels well she has no nausea vomiting fevers chills night sweats no headaches shortness of breath chest pains or palpitations no myalgias and arthralgias. Remainder of the 10 point review of systems is negative.  Past Medical History  Diagnosis Date  . Breast cancer   . Headache   . Back pain   . Asthma     got a lung  infection valley fever in [place] to have lobectomy  . [place] fever     ALLERGIES:  is allergic to aspirin and iodinated diagnostic agents.  MEDICATIONS:  Current Outpatient Prescriptions  Medication Sig Dispense Refill  . albuterol (VENTOLIN HFA) 108 (90 BASE) MCG/ACT inhaler Inhale 2 puffs into the lungs every 6 (six) hours as needed.        Marland Kitchen amoxicillin (AMOXIL) 500 MG capsule Take 1 capsule (500 mg total) by mouth 3 (three) times daily.  30 capsule  11  . dexamethasone (DECADRON) 4 MG tablet Take 2 tablets (8 mg total) by mouth 2 (two) times daily with a meal. Take two times a day the day before Taxotere. Then take two times a day starting the day after chemo for 3 days.  30 tablet  4  . DPH-Lido-AlHydr-MgHydr-Simeth (FIRST-MOUTHWASH BLM) SUSP       . fluticasone (FLONASE) 50 MCG/ACT nasal spray Place 2 sprays into the nose daily.  16 g  2  . lidocaine-prilocaine (EMLA) cream Apply topically as needed.  30 g  7  . LORazepam (ATIVAN) 0.5 MG tablet TAKE 1 TABLET BY MOUTH EVERY 6 HOURS AS NEEDED FOR NAUSEA/VOMITING  30 tablet  0  . naproxen sodium (ANAPROX) 220 MG tablet Take 220 mg by mouth 2 (two) times daily with a meal.      . omeprazole (PRILOSEC) 40 MG capsule Take 1 capsule (40 mg total) by mouth daily.  30 capsule  2  . ondansetron (ZOFRAN) 8 MG tablet Take 1 tablet (8 mg total) by mouth 2 (two) times daily. Take  two times a day starting the day after chemo for 3 days. Then take two times a day as needed for nausea or vomiting.  30 tablet  1  . potassium chloride (K-DUR) 10 MEQ tablet Take 1 tablet (10 mEq total) by mouth 2 (two) times daily.  14 tablet  0  . prochlorperazine (COMPAZINE) 10 MG tablet Take 1 tablet (10 mg total) by mouth every 6 (six) hours as needed (Nausea or vomiting).  30 tablet  1  . valACYclovir (VALTREX) 500 MG tablet        No current facility-administered medications for this visit.    SURGICAL HISTORY:  Past Surgical History  Procedure  Laterality Date  . 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 Cesarean section  1995/1996  . Lobectomy  1997    upper left  . Portacath placement  07/02/2012    Procedure: INSERTION PORT-A-CATH;  Surgeon: Mariella Saa, MD;  Location: [facility];  Service: General;  Laterality: N/A;  right    REVIEW OF SYSTEMS:   General: fatigue (-), night sweats (-), fever (-), pain (-) Lymph: palpable nodes (-) HEENT: vision changes (-), mucositis (+), gum bleeding (-), epistaxis (-) Cardiovascular: chest pain (-), palpitations (-) Pulmonary: shortness of breath (-), dyspnea on exertion (+), cough (-), hemoptysis (-) GI:  Early satiety (-), melena (-), dysphagia (-), nausea/vomiting (-), diarrhea (-) GU: dysuria (-), hematuria (-), incontinence (-) Musculoskeletal: joint swelling (-), joint pain (-), back pain (-) Neuro: weakness (-), numbness (+), headache (-), confusion (-) Skin: Rash (-), lesions (-), dryness (-) Psych: depression (-), suicidal/homicidal ideation (-), feeling of hopelessness (-)   PHYSICAL EXAMINATION: Blood pressure 130/81, pulse 94, temperature 98.3 F (36.8 C), temperature source Oral, resp. rate 20, height 5' 2.5" (1.588 m), weight 123 lb 9.6 oz (56.065 kg), last menstrual period 07/25/2012. Body mass index is 22.23 kg/(m^2). General: Patient is a well appearing female in no acute distress HEENT: PERRLA, sclerae anicteric no conjunctival pallor, MMM, ulceration in bilateral buccal mucosa, blistering in corners of mouth.   Neck: supple, no palpable adenopathy Lungs: mild wheezing in the left lung.  Right lung clear to auscultation Cardiovascular: regular rate rhythm, S1, S2, no murmurs, rubs or gallops Abdomen: Soft, non-tender, non-distended, normoactive bowel sounds, no HSM Extremities: warm and well perfused, no clubbing, cyanosis, or edema Skin: No rashes or lesions Neuro: Non-focal Breasts: The right breast is unremarkable. In  the left breast in the superior lateral aspect up of the areola there is still a palpable mass measuring perhaps 3 cm. It is relatively soft. There is no erythema or other skin change and no nipple retraction. The left axilla was clear ECOG PERFORMANCE STATUS: 1  LABORATORY DATA: Lab Results  Component Value Date   WBC 12.4* 10/18/2012   HGB 9.7* 10/18/2012   HCT 29.2* 10/18/2012   MCV 98.3 10/18/2012   PLT 50* 10/18/2012      Chemistry      Component Value Date/Time   NA 139 10/18/2012 1052   NA 141 01/26/2011 1039   K 3.4* 10/18/2012 1052   K 4.5 01/26/2011 1039   CL 104 10/18/2012 1052   CL 106 01/26/2011 1039   CO2 27 10/18/2012 1052   CO2 28 01/26/2011 1039   BUN 5.8* 10/18/2012 1052   BUN 7 01/26/2011 1039   CREATININE 0.7 10/18/2012 1052   CREATININE 0.6 01/26/2011 1039      Component Value Date/Time  CALCIUM 9.3 10/18/2012 1052   CALCIUM 9.2 01/26/2011 1039   ALKPHOS 106 10/18/2012 1052   AST 29 10/18/2012 1052   ALT 29 10/18/2012 1052   BILITOT 0.32 10/18/2012 1052       RADIOGRAPHIC STUDIES: Dg Chest 2 View  07/30/2012  *RADIOLOGY REPORT*  Clinical Data: Bronchiectasis.  History of breast cancer.  CHEST - 2 VIEW  Comparison: 07/02/2012 and 05/03/2011  Findings: Power port in place.  There is now an ill-defined 16 mm in density in the left lung base which correlates with the previous cystic lesion seen on CT scan of 01/26/2011.  There is central lucency.  I suspect this represents retraction and wall thickening of the previously seen area of cystic bronchiectasis.  Scarring at the left apex medially due to cystic bronchiectasis appears slightly less prominent.  The right lung is clear.  Heart size is normal.  Previous left upper lobectomy.  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  No other change since the prior exam.   Original Report Authenticated By: Francene Boyers, M.D.      ASSESSMENT: 47 year old [place] woman   with  #1 a clinical stage IIA (3.1 cm) invasive ductal carcinoma of the left breast status post needle core biopsy 06/05/2012 that showed ER positive PR positive HER-2/neu positive breast cancer with a Ki-67 at 70%.   #2 started chemotherapy on 07/13/2011 consisting of carboplatin, docetaxel and trastuzumab every 3 weeks, with the trastuzumab given weekly  #3 trastuzumab to be continued to complete a year;  most recent Echo 06/25/2012   #4 Neuropathy  #5 low platelets likely due to chemotherapy, we will continue to monitor this.   PLAN:   1. Ms. Sawhney is feeling well today. She will proceed with Herceptin.     Dr. Johna Sheriff f/u on 4/25 and echo and Dr. Gala Romney appt on 5/12.    2. we will see her back next week for [facility] cycle 6  3.  She will continue to take Super B complex daily for the numbness, currently it is stable  4. She does have mucositis and valtrex and magic mouthwash is helping.    All questions were answered. The patient knows to call the clinic with any problems, questions or concerns. We can certainly see the patient much sooner if necessary.   I spent 25 minutes counseling the patient face to face. The total time spent in the appointment was 30 minutes.  Drue Second, MD Medical/Oncology [facility] [phone] (beeper) [phone] (Office)  10/18/2012, 6:52 PM

## 2012-10-18 NOTE — Patient Instructions (Addendum)
Proceed with herceptin  We will see you back on 4/17

## 2012-10-18 NOTE — Patient Instructions (Signed)
[facility] Discharge Instructions for Patients Receiving Chemotherapy  Today you received the following chemotherapy agents :  Herceptin.  To help prevent nausea and vomiting after your treatment, we encourage you to take your nausea medication as instructed by your physician.    If you develop nausea and vomiting that is not controlled by your nausea medication, call the clinic. If it is after clinic hours your family physician or the after hours number for the clinic or go to the Emergency Department.   BELOW ARE SYMPTOMS THAT SHOULD BE REPORTED IMMEDIATELY:  *FEVER GREATER THAN 100.5 F  *CHILLS WITH OR WITHOUT FEVER  NAUSEA AND VOMITING THAT IS NOT CONTROLLED WITH YOUR NAUSEA MEDICATION  *UNUSUAL SHORTNESS OF BREATH  *UNUSUAL BRUISING OR BLEEDING  TENDERNESS IN MOUTH AND THROAT WITH OR WITHOUT PRESENCE OF ULCERS  *URINARY PROBLEMS  *BOWEL PROBLEMS  UNUSUAL RASH Items with * indicate a potential emergency and should be followed up as soon as possible.  One of the nurses will contact you 24 hours after your treatment. Please let the nurse know about any problems that you may have experienced. Feel free to call the clinic you have any questions or concerns. The clinic phone number is [phone].   I have been informed and understand all the instructions given to me. I know to contact the clinic, my physician, or go to the Emergency Department if any problems should occur. I do not have any questions at this time, but understand that I may call the clinic during office hours   should I have any questions or need assistance in obtaining follow up care.    __________________________________________  _____________  __________ Signature of Patient or Authorized Representative            Date                   Time    __________________________________________ Nurse's Signature    

## 2012-10-25 ENCOUNTER — Encounter: Payer: Self-pay | Admitting: Adult Health

## 2012-10-25 ENCOUNTER — Telehealth: Payer: Self-pay | Admitting: *Deleted

## 2012-10-25 ENCOUNTER — Telehealth: Payer: Self-pay | Admitting: Oncology

## 2012-10-25 ENCOUNTER — Ambulatory Visit (HOSPITAL_BASED_OUTPATIENT_CLINIC_OR_DEPARTMENT_OTHER): Payer: BC Managed Care – PPO | Admitting: Adult Health

## 2012-10-25 ENCOUNTER — Other Ambulatory Visit (HOSPITAL_BASED_OUTPATIENT_CLINIC_OR_DEPARTMENT_OTHER): Payer: BC Managed Care – PPO | Admitting: Lab

## 2012-10-25 ENCOUNTER — Ambulatory Visit (HOSPITAL_BASED_OUTPATIENT_CLINIC_OR_DEPARTMENT_OTHER): Payer: BC Managed Care – PPO

## 2012-10-25 VITALS — BP 113/71 | HR 100 | Temp 98.2°F | Resp 20 | Ht 62.5 in | Wt 124.1 lb

## 2012-10-25 DIAGNOSIS — C50219 Malignant neoplasm of upper-inner quadrant of unspecified female breast: Secondary | ICD-10-CM

## 2012-10-25 DIAGNOSIS — C50919 Malignant neoplasm of unspecified site of unspecified female breast: Secondary | ICD-10-CM

## 2012-10-25 DIAGNOSIS — G609 Hereditary and idiopathic neuropathy, unspecified: Secondary | ICD-10-CM

## 2012-10-25 DIAGNOSIS — Z170 Estrogen receptor positive status [ER+]: Secondary | ICD-10-CM

## 2012-10-25 DIAGNOSIS — Z5112 Encounter for antineoplastic immunotherapy: Secondary | ICD-10-CM

## 2012-10-25 DIAGNOSIS — D6959 Other secondary thrombocytopenia: Secondary | ICD-10-CM

## 2012-10-25 DIAGNOSIS — C50212 Malignant neoplasm of upper-inner quadrant of left female breast: Secondary | ICD-10-CM

## 2012-10-25 LAB — COMPREHENSIVE METABOLIC PANEL (CC13)
ALT: 58 U/L — ABNORMAL HIGH (ref 0–55)
AST: 38 U/L — ABNORMAL HIGH (ref 5–34)
Albumin: 3.2 g/dL — ABNORMAL LOW (ref 3.5–5.0)
BUN: 8.5 mg/dL (ref 7.0–26.0)
CO2: 23 mEq/L (ref 22–29)
Calcium: 9.3 mg/dL (ref 8.4–10.4)
Chloride: 106 mEq/L (ref 98–107)
Potassium: 3.8 mEq/L (ref 3.5–5.1)

## 2012-10-25 LAB — CBC WITH DIFFERENTIAL/PLATELET
BASO%: 0.4 % (ref 0.0–2.0)
Basophils Absolute: 0 10*3/uL (ref 0.0–0.1)
EOS%: 0.2 % (ref 0.0–7.0)
HCT: 25.8 % — ABNORMAL LOW (ref 34.8–46.6)
HGB: 8.5 g/dL — ABNORMAL LOW (ref 11.6–15.9)
MCH: 32.8 pg (ref 25.1–34.0)
MONO#: 0.7 10*3/uL (ref 0.1–0.9)
NEUT%: 51 % (ref 38.4–76.8)
RDW: 20.8 % — ABNORMAL HIGH (ref 11.2–14.5)
WBC: 5.5 10*3/uL (ref 3.9–10.3)
lymph#: 1.9 10*3/uL (ref 0.9–3.3)

## 2012-10-25 MED ORDER — TRASTUZUMAB CHEMO INJECTION 440 MG
2.0000 mg/kg | Freq: Once | INTRAVENOUS | Status: AC
  Administered 2012-10-25: 105 mg via INTRAVENOUS
  Filled 2012-10-25: qty 5

## 2012-10-25 MED ORDER — DIPHENHYDRAMINE HCL 25 MG PO CAPS
50.0000 mg | ORAL_CAPSULE | Freq: Once | ORAL | Status: AC
  Administered 2012-10-25: 25 mg via ORAL

## 2012-10-25 MED ORDER — HEPARIN SOD (PORK) LOCK FLUSH 100 UNIT/ML IV SOLN
500.0000 [IU] | Freq: Once | INTRAVENOUS | Status: AC | PRN
  Administered 2012-10-25: 500 [IU]
  Filled 2012-10-25: qty 5

## 2012-10-25 MED ORDER — ACETAMINOPHEN 325 MG PO TABS
650.0000 mg | ORAL_TABLET | Freq: Once | ORAL | Status: AC
  Administered 2012-10-25: 650 mg via ORAL

## 2012-10-25 MED ORDER — SODIUM CHLORIDE 0.9 % IV SOLN
Freq: Once | INTRAVENOUS | Status: AC
  Administered 2012-10-25: 11:00:00 via INTRAVENOUS

## 2012-10-25 MED ORDER — SODIUM CHLORIDE 0.9 % IJ SOLN
10.0000 mL | INTRAMUSCULAR | Status: DC | PRN
  Administered 2012-10-25: 10 mL
  Filled 2012-10-25: qty 10

## 2012-10-25 NOTE — Progress Notes (Signed)
Patient states she only wants one Benadryl.

## 2012-10-25 NOTE — Patient Instructions (Addendum)
Doing well.  No chemotherapy today, you will receive Herceptin only today, and receive chemo next week.

## 2012-10-25 NOTE — Progress Notes (Signed)
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  PRIOR THERAPY:  #1 patient was originally seen in the multidisciplinary breast clinic when she found a left breast mass she eventually had MRI and mammograms performed. She was diagnosed with invasive ductal carcinoma that was ER positive PR positive HER-2/neu positive measuring 3.1 cm by MRI criteria. Ki-67 was 70% HER-2 was amplified with a ratio 2.91.  #2 patient was seen at the multidisciplinary breast clinic and a recommendation for neoadjuvant chemotherapy was recommended as patient was interested in breast conservation. She will receive neoadjuvant Taxotere carboplatinum Herceptin. Taxotere carboplatinum will be given every 21 days with weekly Herceptin. Total of 6 cycles of Taxotere and carboplatinum will be administered.  3 patient had MRI of the breasts performed on 10/15/2012 which does show reduction in the size of the left breast mass from 3.1to 1.2. This is a wonderful response.  CURRENT THERAPY:  TCH cycle 6 day 1  INTERVAL HISTORY: Patient returns in followup visit prior to her 6th cycle of chemotherapy.  She is doing well today.  She has pain in her fingernails, but denies fevers, chills, nausea, vomiting, shortness of breath, constipation, numbness or any further concerns.    Past Medical History  Diagnosis Date  . Breast cancer   . Headache   . Back pain   . 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 [place] fever     ALLERGIES:  is allergic to aspirin and iodinated diagnostic agents.  MEDICATIONS:  Current Outpatient Prescriptions  Medication Sig Dispense Refill  . albuterol (VENTOLIN HFA) 108 (90 BASE) MCG/ACT  inhaler Inhale 2 puffs into the lungs every 6 (six) hours as needed.        Marland Kitchen amoxicillin (AMOXIL) 500 MG capsule Take 1 capsule (500 mg total) by mouth 3 (three) times daily.  30 capsule  11  . dexamethasone (DECADRON) 4 MG tablet Take 2 tablets (8 mg total) by mouth 2 (two) times daily with a meal. Take two times a day the day before Taxotere. Then take two times a day starting the day after chemo for 3 days.  30 tablet  4  . DPH-Lido-AlHydr-MgHydr-Simeth (FIRST-MOUTHWASH BLM) SUSP       . fluticasone (FLONASE) 50 MCG/ACT nasal spray Place 2 sprays into the nose daily.  16 g  2  . lidocaine-prilocaine (EMLA) cream Apply topically as needed.  30 g  7  . LORazepam (ATIVAN) 0.5 MG tablet TAKE 1 TABLET BY MOUTH EVERY 6 HOURS AS NEEDED FOR NAUSEA/VOMITING  30 tablet  0  . naproxen sodium (ANAPROX) 220 MG tablet Take 220 mg by mouth 2 (two) times daily with a meal.      . omeprazole (PRILOSEC) 40 MG capsule Take 1 capsule (40 mg total) by mouth daily.  30 capsule  2  . ondansetron (ZOFRAN) 8 MG tablet Take 1 tablet (8 mg total) by mouth 2 (two) times daily. Take two times a day starting the day after chemo for 3 days. Then take two times a day as needed for nausea or vomiting.  30 tablet  1  . prochlorperazine (COMPAZINE) 10 MG tablet Take 1 tablet (10 mg total) by mouth every 6 (six)  hours as needed (Nausea or vomiting).  30 tablet  1  . valACYclovir (VALTREX) 500 MG tablet        No current facility-administered medications for this visit.    SURGICAL HISTORY:  Past Surgical History  Procedure Laterality Date  . 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 Cesarean section  1995/1996  . Lobectomy  1997    upper left  . Portacath placement  07/02/2012    Procedure: INSERTION PORT-A-CATH;  Surgeon: Mariella Saa, MD;  Location: [facility];  Service: General;  Laterality: N/A;  right    REVIEW OF SYSTEMS:   General: fatigue (-), night sweats (-), fever  (-), pain (-) Lymph: palpable nodes (-) HEENT: vision changes (-), mucositis (+), gum bleeding (-), epistaxis (-) Cardiovascular: chest pain (-), palpitations (-) Pulmonary: shortness of breath (-), dyspnea on exertion (+), cough (-), hemoptysis (-) GI:  Early satiety (-), melena (-), dysphagia (-), nausea/vomiting (-), diarrhea (-) GU: dysuria (-), hematuria (-), incontinence (-) Musculoskeletal: joint swelling (-), joint pain (-), back pain (-) Neuro: weakness (-), numbness (+), headache (-), confusion (-) Skin: Rash (-), lesions (-), dryness (-) Psych: depression (-), suicidal/homicidal ideation (-), feeling of hopelessness (-)   PHYSICAL EXAMINATION: Blood pressure 113/71, pulse 100, temperature 98.2 F (36.8 C), temperature source Oral, resp. rate 20, height 5' 2.5" (1.588 m), weight 124 lb 1.6 oz (56.291 kg), last menstrual period 07/25/2012. Body mass index is 22.32 kg/(m^2). General: Patient is a well appearing female in no acute distress HEENT: PERRLA, sclerae anicteric no conjunctival pallor, MMM, ulceration in bilateral buccal mucosa, blistering in corners of mouth.   Neck: supple, no palpable adenopathy Lungs: mild wheezing in the left lung.  Right lung clear to auscultation Cardiovascular: regular rate rhythm, S1, S2, no murmurs, rubs or gallops Abdomen: Soft, non-tender, non-distended, normoactive bowel sounds, no HSM Extremities: warm and well perfused, no clubbing, cyanosis, or edema Skin: No rashes or lesions Neuro: Non-focal Breasts: The right breast is unremarkable. In the left breast in the superior lateral aspect up of the areola there is still a palpable mass measuring perhaps 1 cm. It is relatively soft. There is no erythema or other skin change and no nipple retraction. The left axilla was clear ECOG PERFORMANCE STATUS: 1  LABORATORY DATA: Lab Results  Component Value Date   WBC 5.5 10/25/2012   HGB 8.5* 10/25/2012   HCT 25.8* 10/25/2012   MCV 99.6 10/25/2012    PLT 68* 10/25/2012      Chemistry      Component Value Date/Time   NA 139 10/18/2012 1052   NA 141 01/26/2011 1039   K 3.4* 10/18/2012 1052   K 4.5 01/26/2011 1039   CL 104 10/18/2012 1052   CL 106 01/26/2011 1039   CO2 27 10/18/2012 1052   CO2 28 01/26/2011 1039   BUN 5.8* 10/18/2012 1052   BUN 7 01/26/2011 1039   CREATININE 0.7 10/18/2012 1052   CREATININE 0.6 01/26/2011 1039      Component Value Date/Time   CALCIUM 9.3 10/18/2012 1052   CALCIUM 9.2 01/26/2011 1039   ALKPHOS 106 10/18/2012 1052   AST 29 10/18/2012 1052   ALT 29 10/18/2012 1052   BILITOT 0.32 10/18/2012 1052       RADIOGRAPHIC STUDIES: Dg Chest 2 View  07/30/2012  *RADIOLOGY REPORT*  Clinical Data: Bronchiectasis.  History of breast cancer.  CHEST - 2 VIEW  Comparison: 07/02/2012 and 05/03/2011  Findings: Power port in place.  There is now an ill-defined 16 mm in density in the left lung base which correlates with the previous cystic lesion seen on CT scan of 01/26/2011.  There is central lucency.  I suspect this represents retraction and wall thickening of the previously seen area of cystic bronchiectasis.  Scarring at the left apex medially due to cystic bronchiectasis appears slightly less prominent.  The right lung is clear.  Heart size is normal.  Previous left upper lobectomy.  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  No other change since the prior exam.   Original Report Authenticated By: Francene Boyers, M.D.      ASSESSMENT: 47 year old [place] woman  with  #1 a clinical stage IIA (3.1 cm) invasive ductal carcinoma of the left breast status post needle core biopsy 06/05/2012 that showed ER positive PR positive HER-2/neu positive breast cancer with a Ki-67 at 70%.   #2 started chemotherapy on 07/13/2011 consisting of carboplatin, docetaxel and trastuzumab every 3 weeks, with the trastuzumab given weekly  #3 trastuzumab to be continued to complete a  year;  most recent Echo 06/25/2012   #4 Neuropathy  #5 thrombocytopenia likely due to chemotherapy, we will continue to monitor this.   PLAN:   1. Ms. Hogan is feeling well today. She will proceed with Herceptin.  She is unfortunately still thrombocytopenic today, therefore we will have to hold the Taxotere/Carbo x 1 week.   Dr. Johna Sheriff f/u on 4/25 and echo and Dr. Gala Romney appt on 5/12.    2. we will see her back next week for [facility] cycle 6  3.  She will continue to take Super B complex daily for the numbness, currently it is stable  4. She does occasionally develop mucositis and valtrex and magic mouthwash is helping.    All questions were answered. The patient knows to call the clinic with any problems, questions or concerns. We can certainly see the patient much sooner if necessary.   I spent 25 minutes counseling the patient face to face. The total time spent in the appointment was 30 minutes.  Cherie Ouch Lyn Hollingshead, NP Medical Oncology [facility] Phone: [phone] 10/25/2012, 10:22 AM

## 2012-10-25 NOTE — Patient Instructions (Signed)
[facility] Discharge Instructions for Patients Receiving Chemotherapy  Today you received the following chemotherapy agents Herceptin To help prevent nausea and vomiting after your treatment, we encourage you to take your nausea medication as prescribed.  If you develop nausea and vomiting that is not controlled by your nausea medication, call the clinic. If it is after clinic hours your family physician or the after hours number for the clinic or go to the Emergency Department.   BELOW ARE SYMPTOMS THAT SHOULD BE REPORTED IMMEDIATELY:  *FEVER GREATER THAN 100.5 F  *CHILLS WITH OR WITHOUT FEVER  NAUSEA AND VOMITING THAT IS NOT CONTROLLED WITH YOUR NAUSEA MEDICATION  *UNUSUAL SHORTNESS OF BREATH  *UNUSUAL BRUISING OR BLEEDING  TENDERNESS IN MOUTH AND THROAT WITH OR WITHOUT PRESENCE OF ULCERS  *URINARY PROBLEMS  *BOWEL PROBLEMS  UNUSUAL RASH Items with * indicate a potential emergency and should be followed up as soon as possible.  One of the nurses will contact you 24 hours after your treatment. Please let the nurse know about any problems that you may have experienced. Feel free to call the clinic you have any questions or concerns. The clinic phone number is [phone].   I have been informed and understand all the instructions given to me. I know to contact the clinic, my physician, or go to the Emergency Department if any problems should occur. I do not have any questions at this time, but understand that I may call the clinic during office hours   should I have any questions or need assistance in obtaining follow up care.    __________________________________________  _____________  __________ Signature of Patient or Authorized Representative            Date                   Time    __________________________________________ Nurse's Signature    

## 2012-10-25 NOTE — Telephone Encounter (Signed)
Per staff message and POF I have scheduled appts.  JMW  

## 2012-10-26 ENCOUNTER — Ambulatory Visit: Payer: BC Managed Care – PPO

## 2012-10-30 ENCOUNTER — Other Ambulatory Visit: Payer: Self-pay | Admitting: Adult Health

## 2012-11-01 ENCOUNTER — Ambulatory Visit (HOSPITAL_BASED_OUTPATIENT_CLINIC_OR_DEPARTMENT_OTHER): Payer: BC Managed Care – PPO | Admitting: Adult Health

## 2012-11-01 ENCOUNTER — Telehealth: Payer: Self-pay | Admitting: *Deleted

## 2012-11-01 ENCOUNTER — Telehealth: Payer: Self-pay | Admitting: Oncology

## 2012-11-01 ENCOUNTER — Other Ambulatory Visit (HOSPITAL_BASED_OUTPATIENT_CLINIC_OR_DEPARTMENT_OTHER): Payer: BC Managed Care – PPO | Admitting: Lab

## 2012-11-01 ENCOUNTER — Encounter: Payer: Self-pay | Admitting: Adult Health

## 2012-11-01 ENCOUNTER — Ambulatory Visit (HOSPITAL_BASED_OUTPATIENT_CLINIC_OR_DEPARTMENT_OTHER): Payer: BC Managed Care – PPO

## 2012-11-01 VITALS — BP 111/74 | HR 92 | Temp 98.5°F | Resp 20 | Ht 62.5 in | Wt 124.5 lb

## 2012-11-01 DIAGNOSIS — C50219 Malignant neoplasm of upper-inner quadrant of unspecified female breast: Secondary | ICD-10-CM

## 2012-11-01 DIAGNOSIS — G589 Mononeuropathy, unspecified: Secondary | ICD-10-CM

## 2012-11-01 DIAGNOSIS — C50212 Malignant neoplasm of upper-inner quadrant of left female breast: Secondary | ICD-10-CM

## 2012-11-01 DIAGNOSIS — C50919 Malignant neoplasm of unspecified site of unspecified female breast: Secondary | ICD-10-CM

## 2012-11-01 DIAGNOSIS — Z5111 Encounter for antineoplastic chemotherapy: Secondary | ICD-10-CM

## 2012-11-01 DIAGNOSIS — Z170 Estrogen receptor positive status [ER+]: Secondary | ICD-10-CM

## 2012-11-01 DIAGNOSIS — Z5112 Encounter for antineoplastic immunotherapy: Secondary | ICD-10-CM

## 2012-11-01 DIAGNOSIS — M7989 Other specified soft tissue disorders: Secondary | ICD-10-CM

## 2012-11-01 LAB — CBC WITH DIFFERENTIAL/PLATELET
BASO%: 0.7 % (ref 0.0–2.0)
EOS%: 6.9 % (ref 0.0–7.0)
MCH: 34.3 pg — ABNORMAL HIGH (ref 25.1–34.0)
MCHC: 33.2 g/dL (ref 31.5–36.0)
MONO#: 0.4 10*3/uL (ref 0.1–0.9)
RBC: 2.74 10*6/uL — ABNORMAL LOW (ref 3.70–5.45)
RDW: 23.8 % — ABNORMAL HIGH (ref 11.2–14.5)
WBC: 6.1 10*3/uL (ref 3.9–10.3)
lymph#: 1.9 10*3/uL (ref 0.9–3.3)
nRBC: 0 % (ref 0–0)

## 2012-11-01 LAB — COMPREHENSIVE METABOLIC PANEL (CC13)
ALT: 29 U/L (ref 0–55)
AST: 21 U/L (ref 5–34)
Albumin: 3.4 g/dL — ABNORMAL LOW (ref 3.5–5.0)
Alkaline Phosphatase: 77 U/L (ref 40–150)
BUN: 10.3 mg/dL (ref 7.0–26.0)
Potassium: 4 mEq/L (ref 3.5–5.1)
Sodium: 140 mEq/L (ref 136–145)
Total Protein: 6.8 g/dL (ref 6.4–8.3)

## 2012-11-01 MED ORDER — SODIUM CHLORIDE 0.9 % IJ SOLN
10.0000 mL | INTRAMUSCULAR | Status: DC | PRN
  Administered 2012-11-01: 10 mL
  Filled 2012-11-01: qty 10

## 2012-11-01 MED ORDER — DIPHENHYDRAMINE HCL 25 MG PO CAPS
50.0000 mg | ORAL_CAPSULE | Freq: Once | ORAL | Status: AC
  Administered 2012-11-01: 50 mg via ORAL

## 2012-11-01 MED ORDER — SODIUM CHLORIDE 0.9 % IV SOLN
680.0000 mg | Freq: Once | INTRAVENOUS | Status: AC
  Administered 2012-11-01: 680 mg via INTRAVENOUS
  Filled 2012-11-01: qty 68

## 2012-11-01 MED ORDER — SODIUM CHLORIDE 0.9 % IV SOLN
75.0000 mg/m2 | Freq: Once | INTRAVENOUS | Status: AC
  Administered 2012-11-01: 110 mg via INTRAVENOUS
  Filled 2012-11-01: qty 11

## 2012-11-01 MED ORDER — SODIUM CHLORIDE 0.9 % IV SOLN
Freq: Once | INTRAVENOUS | Status: AC
  Administered 2012-11-01: 10:00:00 via INTRAVENOUS

## 2012-11-01 MED ORDER — DEXAMETHASONE SODIUM PHOSPHATE 4 MG/ML IJ SOLN
20.0000 mg | Freq: Once | INTRAMUSCULAR | Status: AC
  Administered 2012-11-01: 20 mg via INTRAVENOUS

## 2012-11-01 MED ORDER — ACETAMINOPHEN 325 MG PO TABS
650.0000 mg | ORAL_TABLET | Freq: Once | ORAL | Status: AC
  Administered 2012-11-01: 650 mg via ORAL

## 2012-11-01 MED ORDER — HEPARIN SOD (PORK) LOCK FLUSH 100 UNIT/ML IV SOLN
500.0000 [IU] | Freq: Once | INTRAVENOUS | Status: AC | PRN
  Administered 2012-11-01: 500 [IU]
  Filled 2012-11-01: qty 5

## 2012-11-01 MED ORDER — TRASTUZUMAB CHEMO INJECTION 440 MG
2.0000 mg/kg | Freq: Once | INTRAVENOUS | Status: AC
  Administered 2012-11-01: 105 mg via INTRAVENOUS
  Filled 2012-11-01: qty 5

## 2012-11-01 MED ORDER — ONDANSETRON 16 MG/50ML IVPB (CHCC)
16.0000 mg | Freq: Once | INTRAVENOUS | Status: AC
  Administered 2012-11-01: 16 mg via INTRAVENOUS

## 2012-11-01 NOTE — Telephone Encounter (Signed)
, °

## 2012-11-01 NOTE — Patient Instructions (Signed)
Doing well.  Proceed with chemotherapy.  Please call us if you have any questions or concerns.    

## 2012-11-01 NOTE — Patient Instructions (Addendum)
[facility] Discharge Instructions for Patients Receiving Chemotherapy  Today you received the following chemotherapy agents Taxotere, Carboplatin and Herceptin.  To help prevent nausea and vomiting after your treatment, we encourage you to take your nausea medication.   If you develop nausea and vomiting that is not controlled by your nausea medication, call the clinic. If it is after clinic hours your family physician or the after hours number for the clinic or go to the Emergency Department.   BELOW ARE SYMPTOMS THAT SHOULD BE REPORTED IMMEDIATELY:  *FEVER GREATER THAN 100.5 F  *CHILLS WITH OR WITHOUT FEVER  NAUSEA AND VOMITING THAT IS NOT CONTROLLED WITH YOUR NAUSEA MEDICATION  *UNUSUAL SHORTNESS OF BREATH  *UNUSUAL BRUISING OR BLEEDING  TENDERNESS IN MOUTH AND THROAT WITH OR WITHOUT PRESENCE OF ULCERS  *URINARY PROBLEMS  *BOWEL PROBLEMS  UNUSUAL RASH Items with * indicate a potential emergency and should be followed up as soon as possible.  One of the nurses will contact you 24 hours after your treatment. Please let the nurse know about any problems that you may have experienced. Feel free to call the clinic you have any questions or concerns. The clinic phone number is [phone].   I have been informed and understand all the instructions given to me. I know to contact the clinic, my physician, or go to the Emergency Department if any problems should occur. I do not have any questions at this time, but understand that I may call the clinic during office hours   should I have any questions or need assistance in obtaining follow up care.    __________________________________________  _____________  __________ Signature of Patient or Authorized Representative            Date                   Time    __________________________________________ Nurse's Signature    

## 2012-11-01 NOTE — Telephone Encounter (Signed)
Per staff phone call and POF I have schedueld appts.  JMW  

## 2012-11-01 NOTE — Progress Notes (Signed)
OFFICE PROGRESS NOTE  CC  PCP: [name], MD   [name], MD [address] [place] [address]  DIAGNOSIS: 47 year old female with premenopausal ER positive PR positive HER-2/neu positive invasive ductal carcinoma of the left breast clinical stage IIA diagnosed in November 2013.  PRIOR THERAPY:  #1 patient was originally seen in the multidisciplinary breast clinic when she found a left breast mass she eventually had MRI and mammograms performed. She was diagnosed with invasive ductal carcinoma that was ER positive PR positive HER-2/neu positive measuring 3.1 cm by MRI criteria. Ki-67 was 70% HER-2 was amplified with a ratio 2.91.  #2 patient was seen at the multidisciplinary breast clinic and a recommendation for neoadjuvant chemotherapy was recommended as patient was interested in breast conservation. She will receive neoadjuvant Taxotere carboplatinum Herceptin. Taxotere carboplatinum will be given every 21 days with weekly Herceptin. Total of 6 cycles of Taxotere and carboplatinum will be administered.  This was started 07/12/12.  3 patient had MRI of the breasts performed on 10/15/2012 which does show reduction in the size of the left breast mass from 3.1to 1.2. This is a wonderful response.  CURRENT THERAPY:  TCH cycle 6 day 1  INTERVAL HISTORY: Patient returns in followup visit prior to her 6th cycle of neoadjuvant chemotherapy for left breast cancer.  She is doing well today.  Treatment was held last week due to thrombocytopenia which has improved.  She denies fevers, chills, nausea, vomiting, constipation, diarrhea, numbness.  Since the numbness is so improved, she stopped taking the Super B complex.  She has had intermittent bilateral lower extremity swelling.  She hasn't had an increased amount of sodium intake, is elevating her legs at night and isn't spending a lot of time standing.  Her last echo was 3/13, and her next echo and Dr. Gala Romney appointment is on  11/19/12.    Past Medical History  Diagnosis Date  . Breast cancer   . Headache   . Back pain   . 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 [place] fever     ALLERGIES:  is allergic to aspirin and iodinated diagnostic agents.  MEDICATIONS:  Current Outpatient Prescriptions  Medication Sig Dispense Refill  . albuterol (VENTOLIN HFA) 108 (90 BASE) MCG/ACT inhaler Inhale 2 puffs into the lungs every 6 (six) hours as needed.        Marland Kitchen amoxicillin (AMOXIL) 500 MG capsule Take 1 capsule (500 mg total) by mouth 3 (three) times daily.  30 capsule  11  . dexamethasone (DECADRON) 4 MG tablet Take 2 tablets (8 mg total) by mouth 2 (two) times daily with a meal. Take two times a day the day before Taxotere. Then take two times a day starting the day after chemo for 3 days.  30 tablet  4  . fluticasone (FLONASE) 50 MCG/ACT nasal spray Place 2 sprays into the nose daily.  16 g  2  . lidocaine-prilocaine (EMLA) cream Apply topically as needed.  30 g  7  . LORazepam (ATIVAN) 0.5 MG tablet TAKE 1 TABLET BY MOUTH EVERY 6 HOURS AS NEEDED FOR NAUSEA/VOMITING  30 tablet  0  . naproxen sodium (ANAPROX) 220 MG tablet Take 220 mg by mouth 2 (two) times daily with a meal.      . omeprazole (PRILOSEC) 40 MG capsule TAKE 1 CAPSULE BY MOUTH DAILY  30 capsule  2  . ondansetron (ZOFRAN) 8 MG tablet  Take 1 tablet (8 mg total) by mouth 2 (two) times daily. Take two times a day starting the day after chemo for 3 days. Then take two times a day as needed for nausea or vomiting.  30 tablet  1  . prochlorperazine (COMPAZINE) 10 MG tablet Take 1 tablet (10 mg total) by mouth every 6 (six) hours as needed (Nausea or vomiting).  30 tablet  1  . valACYclovir (VALTREX) 500 MG tablet       . DPH-Lido-AlHydr-MgHydr-Simeth (FIRST-MOUTHWASH BLM) SUSP        No current facility-administered medications for this visit.    SURGICAL HISTORY:  Past Surgical History  Procedure  Laterality Date  . 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 Cesarean section  1995/1996  . Lobectomy  1997    upper left  . Portacath placement  07/02/2012    Procedure: INSERTION PORT-A-CATH;  Surgeon: Mariella Saa, MD;  Location: [facility];  Service: General;  Laterality: N/A;  right    REVIEW OF SYSTEMS:   General: fatigue (-), night sweats (-), fever (-), pain (-) Lymph: palpable nodes (-) HEENT: vision changes (-), mucositis (-), gum bleeding (-), epistaxis (-) Cardiovascular: chest pain (-), palpitations (-) Pulmonary: shortness of breath (-), dyspnea on exertion (-), cough (-), hemoptysis (-) GI:  Early satiety (-), melena (-), dysphagia (-), nausea/vomiting (-), diarrhea (-) GU: dysuria (-), hematuria (-), incontinence (-) Musculoskeletal: joint swelling (-), joint pain (-), back pain (-) Neuro: weakness (-), numbness (-), headache (-), confusion (-) Skin: Rash (-), lesions (-), dryness (-) Psych: depression (-), suicidal/homicidal ideation (-), feeling of hopelessness (-)   PHYSICAL EXAMINATION: Blood pressure 111/74, pulse 92, temperature 98.5 F (36.9 C), temperature source Oral, resp. rate 20, height 5' 2.5" (1.588 m), weight 124 lb 8 oz (56.473 kg), last menstrual period 07/25/2012. Body mass index is 22.39 kg/(m^2). General: Patient is a well appearing female in no acute distress HEENT: PERRLA, sclerae anicteric no conjunctival pallor, MMM, ulceration in bilateral buccal mucosa, blistering in corners of mouth.   Neck: supple, no palpable adenopathy Lungs: mild wheezing in the left lung.  Right lung clear to auscultation Cardiovascular: regular rate rhythm, S1, S2, no murmurs, rubs or gallops Abdomen: Soft, non-tender, non-distended, normoactive bowel sounds, no HSM Extremities: warm and well perfused, no clubbing, cyanosis, or edema Skin: No rashes or lesions Neuro: Non-focal Breasts: The right breast is unremarkable. In  the left breast in the superior lateral aspect up of the areola there is still a palpable mass measuring perhaps 1 cm. It is relatively soft. There is no erythema or other skin change and no nipple retraction. The left axilla was clear ECOG PERFORMANCE STATUS: 1  LABORATORY DATA: Lab Results  Component Value Date   WBC 6.1 11/01/2012   HGB 9.4* 11/01/2012   HCT 28.3* 11/01/2012   MCV 103.3* 11/01/2012   PLT 374 11/01/2012      Chemistry      Component Value Date/Time   NA 139 10/25/2012 0911   NA 141 01/26/2011 1039   K 3.8 10/25/2012 0911   K 4.5 01/26/2011 1039   CL 106 10/25/2012 0911   CL 106 01/26/2011 1039   CO2 23 10/25/2012 0911   CO2 28 01/26/2011 1039   BUN 8.5 10/25/2012 0911   BUN 7 01/26/2011 1039   CREATININE 0.7 10/25/2012 0911   CREATININE 0.6 01/26/2011 1039      Component Value Date/Time  CALCIUM 9.3 10/25/2012 0911   CALCIUM 9.2 01/26/2011 1039   ALKPHOS 71 10/25/2012 0911   AST 38* 10/25/2012 0911   ALT 58* 10/25/2012 0911   BILITOT 0.27 10/25/2012 0911       RADIOGRAPHIC STUDIES: Dg Chest 2 View  07/30/2012  *RADIOLOGY REPORT*  Clinical Data: Bronchiectasis.  History of breast cancer.  CHEST - 2 VIEW  Comparison: 07/02/2012 and 05/03/2011  Findings: Power port in place.  There is now an ill-defined 16 mm in density in the left lung base which correlates with the previous cystic lesion seen on CT scan of 01/26/2011.  There is central lucency.  I suspect this represents retraction and wall thickening of the previously seen area of cystic bronchiectasis.  Scarring at the left apex medially due to cystic bronchiectasis appears slightly less prominent.  The right lung is clear.  Heart size is normal.  Previous left upper lobectomy.  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  No other change since the prior exam.   Original Report Authenticated By: Francene Boyers, M.D.      ASSESSMENT: 47 year old [place] woman   with  #1 a clinical stage IIA (3.1 cm) invasive ductal carcinoma of the left breast status post needle core biopsy 06/05/2012 that showed ER positive PR positive HER-2/neu positive breast cancer with a Ki-67 at 70%.   #2 started chemotherapy on 07/13/2011 consisting of carboplatin, docetaxel and trastuzumab every 3 weeks, with the trastuzumab given weekly  #3 trastuzumab to be continued to complete a year;  most recent Echo 09/19/12  #4 Neuropathy  #5 Lower extremity swelling   PLAN:   1. Ms. Bugbee is feeling well today. She will proceed with chemotherapy today.   We discussed the next steps after chemotherapy, including the change to every 3 week Herceptin, surgery, and follow up with Dr. Dayton Scrape in radiation oncology.  Dr. Johna Sheriff f/u on 4/25 and echo and Dr. Gala Romney appt on 5/12.    2. Ms. Freeze will continue to elevate her legs, and we will keep an eye on the swelling, should it worsen, we will work it up.    3. Neuropathy is improved this week, she is not taking the super b complex.   4. She does occasionally develop mucositis and valtrex and magic mouthwash is helping.    All questions were answered. The patient knows to call the clinic with any problems, questions or concerns. We can certainly see the patient much sooner if necessary.   I spent 25 minutes counseling the patient face to face. The total time spent in the appointment was 30 minutes.  Cherie Ouch Lyn Hollingshead, NP Medical Oncology [facility] Phone: [phone] 11/01/2012, 9:26 AM

## 2012-11-02 ENCOUNTER — Ambulatory Visit: Payer: BC Managed Care – PPO

## 2012-11-02 ENCOUNTER — Encounter (INDEPENDENT_AMBULATORY_CARE_PROVIDER_SITE_OTHER): Payer: Self-pay | Admitting: General Surgery

## 2012-11-02 ENCOUNTER — Ambulatory Visit (INDEPENDENT_AMBULATORY_CARE_PROVIDER_SITE_OTHER): Payer: BC Managed Care – PPO | Admitting: General Surgery

## 2012-11-02 ENCOUNTER — Ambulatory Visit (HOSPITAL_BASED_OUTPATIENT_CLINIC_OR_DEPARTMENT_OTHER): Payer: BC Managed Care – PPO

## 2012-11-02 VITALS — BP 130/82 | HR 92 | Temp 97.2°F | Resp 16 | Ht 62.5 in | Wt 124.0 lb

## 2012-11-02 VITALS — BP 123/86 | HR 101 | Temp 98.3°F

## 2012-11-02 DIAGNOSIS — C50212 Malignant neoplasm of upper-inner quadrant of left female breast: Secondary | ICD-10-CM

## 2012-11-02 DIAGNOSIS — C50919 Malignant neoplasm of unspecified site of unspecified female breast: Secondary | ICD-10-CM

## 2012-11-02 DIAGNOSIS — C50219 Malignant neoplasm of upper-inner quadrant of unspecified female breast: Secondary | ICD-10-CM

## 2012-11-02 DIAGNOSIS — Z5189 Encounter for other specified aftercare: Secondary | ICD-10-CM

## 2012-11-02 MED ORDER — PEGFILGRASTIM INJECTION 6 MG/0.6ML
6.0000 mg | Freq: Once | SUBCUTANEOUS | Status: AC
  Administered 2012-11-02: 6 mg via SUBCUTANEOUS
  Filled 2012-11-02: qty 0.6

## 2012-11-02 NOTE — Patient Instructions (Addendum)
Our office should call you to schedule your surgery next week. Please call us if you do not hear anything

## 2012-11-02 NOTE — Progress Notes (Signed)
Chief complaint: Followup left breast cancer  History: Patient comes in for further followup of her left breast cancer following neoadjuvant treatment. 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  PRIOR THERAPY:  #1 patient was originally seen in the multidisciplinary breast clinic when she found a left breast mass she eventually had MRI and mammograms performed. She was diagnosed with invasive ductal carcinoma that was ER positive PR positive HER-2/neu positive measuring 3.1 cm by MRI criteria. Ki-67 was 70% HER-2 was amplified with a ratio 2.91.  #2 patient was seen at the multidisciplinary breast clinic and a recommendation for neoadjuvant chemotherapy was recommended as patient was interested in breast conservation. She will receive neoadjuvant Taxotere carboplatinum Herceptin. Taxotere carboplatinum will be given every 21 days with weekly Herceptin. Total of 6 cycles of Taxotere and carboplatinum will be administered. This was started 07/12/12.   She just completed her final dose of Taxotere and carboplatin. She had a followup MRI performed which has shown significant reduction in her left breast mass from initially 3.1 cm to currently 1.1 cm. She states her palpable abnormality has resolved. MRI showed some questionably enlarged lymph nodes in the axilla but not clearly abnormal. She is tolerating her treatment with just expected side effects.  Past Medical History  Diagnosis Date  . Breast cancer   . Headache   . Back pain   . 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 [place] fever    Past Surgical History  Procedure Laterality Date  . 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 Cesarean section  1995/1996  . Lobectomy  1997    upper left  . Portacath placement   07/02/2012    Procedure: INSERTION PORT-A-CATH;  Surgeon: Mariella Saa, MD;  Location: [facility];  Service: General;  Laterality: N/A;  right   Current Outpatient Prescriptions  Medication Sig Dispense Refill  . albuterol (VENTOLIN HFA) 108 (90 BASE) MCG/ACT inhaler Inhale 2 puffs into the lungs every 6 (six) hours as needed.        Marland Kitchen amoxicillin (AMOXIL) 500 MG capsule Take 1 capsule (500 mg total) by mouth 3 (three) times daily.  30 capsule  11  . dexamethasone (DECADRON) 4 MG tablet Take 2 tablets (8 mg total) by mouth 2 (two) times daily with a meal. Take two times a day the day before Taxotere. Then take two times a day starting the day after chemo for 3 days.  30 tablet  4  . DPH-Lido-AlHydr-MgHydr-Simeth (FIRST-MOUTHWASH BLM) SUSP       . fluticasone (FLONASE) 50 MCG/ACT nasal spray Place 2 sprays into the nose daily.  16 g  2  . lidocaine-prilocaine (EMLA) cream Apply topically as needed.  30 g  7  . LORazepam (ATIVAN) 0.5 MG tablet TAKE 1 TABLET BY MOUTH EVERY 6 HOURS AS NEEDED FOR NAUSEA/VOMITING  30 tablet  0  . naproxen sodium (ANAPROX) 220 MG tablet Take 220 mg by mouth 2 (two) times daily with a meal.      . omeprazole (PRILOSEC) 40 MG capsule TAKE 1 CAPSULE BY MOUTH DAILY  30 capsule  2  . ondansetron (ZOFRAN) 8 MG tablet Take 1 tablet (8 mg total) by mouth 2 (two) times daily. Take two times a day starting  the day after chemo for 3 days. Then take two times a day as needed for nausea or vomiting.  30 tablet  1  . prochlorperazine (COMPAZINE) 10 MG tablet Take 1 tablet (10 mg total) by mouth every 6 (six) hours as needed (Nausea or vomiting).  30 tablet  1  . valACYclovir (VALTREX) 500 MG tablet        No current facility-administered medications for this visit.   Allergies  Allergen Reactions  . Aspirin     Unknown reaction  . Iodinated Diagnostic Agents Hives    Pt states in [place] got hives and itching with IV contrast. Per pt allergy  was to prev CT.   History  Substance Use Topics  . Smoking status: Current Every Day Smoker -- 0.50 packs/day for 24 years    Types: Cigarettes  . Smokeless tobacco: Never Used  . Alcohol Use: No   Exam: BP 130/82  Pulse 92  Temp(Src) 97.2 F (36.2 C) (Temporal)  Resp 16  Ht 5' 2.5" (1.588 m)  Wt 124 lb (56.246 kg)  BMI 22.3 kg/m2  LMP 07/25/2012 General: Well-developed Caucasian female in no distress Skin: No rash or infection HEENT: No palpable masses. Alopecia. Lungs: Mild end expiratory wheezing Lymph nodes: No cervical, supraclavicular or axillary nodes palpable Breasts: I cannot feel any distinct mass currently in her left breast. No appreciable abnormalities in either breast. Cardiac: Regular rate and rhythm without murmurs Abdomen soft nontender without masses  Assessment plan: She has had an excellent response to neoadjuvant chemotherapy which raking of her primary tumor from 3.1-1.1 cm. We discussed proceeding with definitive surgical treatment. She would be a good candidate for lobectomy apparently which is what she desires. We discussed needle localized lumpectomy and axillary sentinel lymph node biopsy on the left. We discussed the nature and rationale for the procedures and recovery as well as risks of anesthetic complications, bleeding, infection, possible need for further surgery based on final pathology findings and small risk of lymphedema. All her questions were answered. We will schedule her surgery in about 4 weeks to allow for recovery from her latest chemotherapy.

## 2012-11-08 ENCOUNTER — Other Ambulatory Visit (HOSPITAL_BASED_OUTPATIENT_CLINIC_OR_DEPARTMENT_OTHER): Payer: BC Managed Care – PPO | Admitting: Lab

## 2012-11-08 ENCOUNTER — Ambulatory Visit (HOSPITAL_BASED_OUTPATIENT_CLINIC_OR_DEPARTMENT_OTHER): Payer: BC Managed Care – PPO | Admitting: Adult Health

## 2012-11-08 ENCOUNTER — Encounter (INDEPENDENT_AMBULATORY_CARE_PROVIDER_SITE_OTHER): Payer: BC Managed Care – PPO | Admitting: General Surgery

## 2012-11-08 ENCOUNTER — Encounter: Payer: Self-pay | Admitting: Adult Health

## 2012-11-08 ENCOUNTER — Ambulatory Visit (HOSPITAL_BASED_OUTPATIENT_CLINIC_OR_DEPARTMENT_OTHER): Payer: BC Managed Care – PPO

## 2012-11-08 VITALS — BP 117/74 | HR 105 | Temp 98.1°F | Resp 20 | Ht 62.5 in | Wt 122.5 lb

## 2012-11-08 DIAGNOSIS — C50919 Malignant neoplasm of unspecified site of unspecified female breast: Secondary | ICD-10-CM

## 2012-11-08 DIAGNOSIS — C50219 Malignant neoplasm of upper-inner quadrant of unspecified female breast: Secondary | ICD-10-CM

## 2012-11-08 DIAGNOSIS — C50212 Malignant neoplasm of upper-inner quadrant of left female breast: Secondary | ICD-10-CM

## 2012-11-08 DIAGNOSIS — Z5112 Encounter for antineoplastic immunotherapy: Secondary | ICD-10-CM

## 2012-11-08 DIAGNOSIS — Z170 Estrogen receptor positive status [ER+]: Secondary | ICD-10-CM

## 2012-11-08 LAB — COMPREHENSIVE METABOLIC PANEL (CC13)
ALT: 21 U/L (ref 0–55)
Albumin: 3.6 g/dL (ref 3.5–5.0)
CO2: 27 mEq/L (ref 22–29)
Calcium: 9.6 mg/dL (ref 8.4–10.4)
Chloride: 101 mEq/L (ref 98–107)
Glucose: 96 mg/dl (ref 70–99)
Sodium: 138 mEq/L (ref 136–145)
Total Bilirubin: 0.21 mg/dL (ref 0.20–1.20)
Total Protein: 6.7 g/dL (ref 6.4–8.3)

## 2012-11-08 LAB — CBC WITH DIFFERENTIAL/PLATELET
Basophils Absolute: 0 10*3/uL (ref 0.0–0.1)
EOS%: 1.1 % (ref 0.0–7.0)
HGB: 8.6 g/dL — ABNORMAL LOW (ref 11.6–15.9)
MCH: 35.8 pg — ABNORMAL HIGH (ref 25.1–34.0)
MCHC: 33.5 g/dL (ref 31.5–36.0)
MCV: 107.1 fL — ABNORMAL HIGH (ref 79.5–101.0)
MONO%: 32.7 % — ABNORMAL HIGH (ref 0.0–14.0)
RDW: 23.2 % — ABNORMAL HIGH (ref 11.2–14.5)

## 2012-11-08 MED ORDER — SODIUM CHLORIDE 0.9 % IJ SOLN
10.0000 mL | INTRAMUSCULAR | Status: DC | PRN
  Administered 2012-11-08: 10 mL
  Filled 2012-11-08: qty 10

## 2012-11-08 MED ORDER — SODIUM CHLORIDE 0.9 % IV SOLN: Freq: Once | INTRAVENOUS | Status: DC

## 2012-11-08 MED ORDER — ACETAMINOPHEN 325 MG PO TABS
650.0000 mg | ORAL_TABLET | Freq: Once | ORAL | Status: AC
  Administered 2012-11-08: 650 mg via ORAL

## 2012-11-08 MED ORDER — DIPHENHYDRAMINE HCL 25 MG PO CAPS
50.0000 mg | ORAL_CAPSULE | Freq: Once | ORAL | Status: AC
  Administered 2012-11-08: 25 mg via ORAL

## 2012-11-08 MED ORDER — TRASTUZUMAB CHEMO INJECTION 440 MG
2.0000 mg/kg | Freq: Once | INTRAVENOUS | Status: AC
  Administered 2012-11-08: 105 mg via INTRAVENOUS
  Filled 2012-11-08: qty 5

## 2012-11-08 MED ORDER — HEPARIN SOD (PORK) LOCK FLUSH 100 UNIT/ML IV SOLN
500.0000 [IU] | Freq: Once | INTRAVENOUS | Status: AC | PRN
  Administered 2012-11-08: 500 [IU]
  Filled 2012-11-08: qty 5

## 2012-11-08 NOTE — Patient Instructions (Addendum)
Doing well.  Proceed with Herceptin.  Please call us if you have any questions or concerns.    

## 2012-11-08 NOTE — Patient Instructions (Addendum)

## 2012-11-08 NOTE — Progress Notes (Signed)
OFFICE PROGRESS NOTE  CC  PCP: [name], MD   [name], MD 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  PRIOR THERAPY:  #1 patient was originally seen in the multidisciplinary breast clinic when she found a left breast mass she eventually had MRI and mammograms performed. She was diagnosed with invasive ductal carcinoma that was ER positive PR positive HER-2/neu positive measuring 3.1 cm by MRI criteria. Ki-67 was 70% HER-2 was amplified with a ratio 2.91.  #2 patient was seen at the multidisciplinary breast clinic and a recommendation for neoadjuvant chemotherapy was recommended as patient was interested in breast conservation. She will receive neoadjuvant Taxotere carboplatinum Herceptin. Taxotere carboplatinum will be given every 21 days with weekly Herceptin. Total of 6 cycles of Taxotere and carboplatinum will be administered.  This was started 07/12/12.  3 patient had MRI of the breasts performed on 10/15/2012 which does show reduction in the size of the left breast mass from 3.1to 1.2. This is a wonderful response.  CURRENT THERAPY:  TCH cycle 6 day 8  INTERVAL HISTORY: Patient returns in followup visit prior to weekly neoadjuvant Herceptin treatment for her left breast cancer.  She is doing well today.  She is doing well today.  She denies fevers, chills, nausea, vomiting, numbness, constipation, diarrhea, or any further concerns.  She met with Dr. Johna Sheriff and has a lumpectomy scheduled on 11/30/12.  Past Medical History  Diagnosis Date  . Breast cancer   . Headache   . Back pain   . 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 [place] fever     ALLERGIES:  is allergic to aspirin and iodinated diagnostic agents.  MEDICATIONS:   Current Outpatient Prescriptions  Medication Sig Dispense Refill  . albuterol (VENTOLIN HFA) 108 (90 BASE) MCG/ACT inhaler Inhale 2 puffs into the lungs every 6 (six) hours as needed.        Marland Kitchen amoxicillin (AMOXIL) 500 MG capsule Take 1 capsule (500 mg total) by mouth 3 (three) times daily.  30 capsule  11  . dexamethasone (DECADRON) 4 MG tablet Take 2 tablets (8 mg total) by mouth 2 (two) times daily with a meal. Take two times a day the day before Taxotere. Then take two times a day starting the day after chemo for 3 days.  30 tablet  4  . DPH-Lido-AlHydr-MgHydr-Simeth (FIRST-MOUTHWASH BLM) SUSP       . fluticasone (FLONASE) 50 MCG/ACT nasal spray Place 2 sprays into the nose daily.  16 g  2  . lidocaine-prilocaine (EMLA) cream Apply topically as needed.  30 g  7  . LORazepam (ATIVAN) 0.5 MG tablet TAKE 1 TABLET BY MOUTH EVERY 6 HOURS AS NEEDED FOR NAUSEA/VOMITING  30 tablet  0  . naproxen sodium (ANAPROX) 220 MG tablet Take 220 mg by mouth 2 (two) times daily with a meal.      . omeprazole (PRILOSEC) 40 MG capsule TAKE 1 CAPSULE BY MOUTH DAILY  30 capsule  2  . ondansetron (ZOFRAN) 8 MG tablet Take 1 tablet (8 mg total) by mouth 2 (two) times daily. Take two times a day starting the day after chemo for 3 days. Then take two times a day as needed for nausea or vomiting.  30 tablet  1  . prochlorperazine (  COMPAZINE) 10 MG tablet Take 1 tablet (10 mg total) by mouth every 6 (six) hours as needed (Nausea or vomiting).  30 tablet  1  . valACYclovir (VALTREX) 500 MG tablet        No current facility-administered medications for this visit.   Facility-Administered Medications Ordered in Other Visits  Medication Dose Route Frequency Provider Last Rate Last Dose  . 0.9 %  sodium chloride infusion   Intravenous Once Victorino December, MD      . heparin lock flush 100 unit/mL  500 Units Intracatheter Once PRN Victorino December, MD      . sodium chloride 0.9 % injection 10 mL  10 mL Intracatheter PRN Victorino December, MD      . trastuzumab (HERCEPTIN) 105 mg in sodium chloride 0.9 % 250 mL chemo infusion  2 mg/kg (Treatment Plan Actual) Intravenous Once Victorino December, MD 510 mL/hr at 11/08/12 1627 105 mg at 11/08/12 1627    SURGICAL HISTORY:  Past Surgical History  Procedure Laterality Date  . 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 Cesarean section  1995/1996  . Lobectomy  1997    upper left  . Portacath placement  07/02/2012    Procedure: INSERTION PORT-A-CATH;  Surgeon: Mariella Saa, MD;  Location: [facility];  Service: General;  Laterality: N/A;  right    REVIEW OF SYSTEMS:   General: fatigue (-), night sweats (-), fever (-), pain (-) Lymph: palpable nodes (-) HEENT: vision changes (-), mucositis (-), gum bleeding (-), epistaxis (-) Cardiovascular: chest pain (-), palpitations (-) Pulmonary: shortness of breath (-), dyspnea on exertion (-), cough (-), hemoptysis (-) GI:  Early satiety (-), melena (-), dysphagia (-), nausea/vomiting (-), diarrhea (-) GU: dysuria (-), hematuria (-), incontinence (-) Musculoskeletal: joint swelling (-), joint pain (-), back pain (-) Neuro: weakness (-), numbness (-), headache (-), confusion (-) Skin: Rash (-), lesions (-), dryness (-) Psych: depression (-), suicidal/homicidal ideation (-), feeling of hopelessness (-)   PHYSICAL EXAMINATION: Blood pressure 117/74, pulse 105, temperature 98.1 F (36.7 C), temperature source Oral, resp. rate 20, height 5' 2.5" (1.588 m), weight 122 lb 8 oz (55.566 kg), last menstrual period 07/25/2012. Body mass index is 22.03 kg/(m^2). General: Patient is a well appearing female in no acute distress HEENT: PERRLA, sclerae anicteric no conjunctival pallor, MMM, ulceration in bilateral buccal mucosa, blistering in corners of mouth.   Neck: supple, no palpable adenopathy Lungs: mild wheezing in the left lung.  Right lung clear to auscultation Cardiovascular: regular rate  rhythm, S1, S2, no murmurs, rubs or gallops Abdomen: Soft, non-tender, non-distended, normoactive bowel sounds, no HSM Extremities: warm and well perfused, no clubbing, cyanosis, or edema Skin: No rashes or lesions Neuro: Non-focal Breasts: The right breast is unremarkable. In the left breast in the superior lateral aspect up of the areola there is still a palpable mass measuring perhaps 1 cm. It is relatively soft. There is no erythema or other skin change and no nipple retraction. The left axilla was clear ECOG PERFORMANCE STATUS: 1  LABORATORY DATA: Lab Results  Component Value Date   WBC 18.0* 11/08/2012   HGB 8.6* 11/08/2012   HCT 25.7* 11/08/2012   MCV 107.1* 11/08/2012   PLT 137* 11/08/2012      Chemistry      Component Value Date/Time   NA 140 11/01/2012 0851   NA 141 01/26/2011 1039   K 4.0 11/01/2012 8469  K 4.5 01/26/2011 1039   CL 107 11/01/2012 0851   CL 106 01/26/2011 1039   CO2 24 11/01/2012 0851   CO2 28 01/26/2011 1039   BUN 10.3 11/01/2012 0851   BUN 7 01/26/2011 1039   CREATININE 0.7 11/01/2012 0851   CREATININE 0.6 01/26/2011 1039      Component Value Date/Time   CALCIUM 9.5 11/01/2012 0851   CALCIUM 9.2 01/26/2011 1039   ALKPHOS 77 11/01/2012 0851   AST 21 11/01/2012 0851   ALT 29 11/01/2012 0851   BILITOT 0.24 11/01/2012 0851       RADIOGRAPHIC STUDIES: Dg Chest 2 View  07/30/2012  *RADIOLOGY REPORT*  Clinical Data: Bronchiectasis.  History of breast cancer.  CHEST - 2 VIEW  Comparison: 07/02/2012 and 05/03/2011  Findings: Power port in place.  There is now an ill-defined 16 mm in density in the left lung base which correlates with the previous cystic lesion seen on CT scan of 01/26/2011.  There is central lucency.  I suspect this represents retraction and wall thickening of the previously seen area of cystic bronchiectasis.  Scarring at the left apex medially due to cystic bronchiectasis appears slightly less prominent.  The right lung is clear.  Heart size is normal.   Previous left upper lobectomy.  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  No other change since the prior exam.   Original Report Authenticated By: Francene Boyers, M.D.      ASSESSMENT: 47 year old [place] woman  with  #1 a clinical stage IIA (3.1 cm) invasive ductal carcinoma of the left breast status post needle core biopsy 06/05/2012 that showed ER positive PR positive HER-2/neu positive breast cancer with a Ki-67 at 70%.   #2 started chemotherapy on 07/13/2011 consisting of carboplatin, docetaxel and trastuzumab every 3 weeks, with the trastuzumab given weekly  #3 trastuzumab to be continued to complete a year;  most recent Echo 09/19/12  #4 Neuropathy   PLAN:   1. Ms. Crocker is feeling well today. She will proceed with Herceptin today.  She will   2. She does occasionally develop mucositis and valtrex and magic mouthwash is helping.    3. She will return on 5/22 for her next herceptin.    All questions were answered. The patient knows to call the clinic with any problems, questions or concerns. We can certainly see the patient much sooner if necessary.   I spent 25 minutes counseling the patient face to face. The total time spent in the appointment was 30 minutes.  Cherie Ouch Lyn Hollingshead, NP Medical Oncology [facility] Phone: [phone] 11/08/2012, 4:28 PM

## 2012-11-19 ENCOUNTER — Ambulatory Visit (HOSPITAL_COMMUNITY)
Admission: RE | Admit: 2012-11-19 | Discharge: 2012-11-19 | Disposition: A | Discharge status: Home / Self Care | Payer: BC Managed Care – PPO | Source: Ambulatory Visit | Attending: Family Medicine | Admitting: Family Medicine

## 2012-11-19 ENCOUNTER — Encounter (HOSPITAL_COMMUNITY): Payer: Self-pay

## 2012-11-19 ENCOUNTER — Ambulatory Visit (HOSPITAL_BASED_OUTPATIENT_CLINIC_OR_DEPARTMENT_OTHER)
Admission: RE | Admit: 2012-11-19 | Discharge: 2012-11-19 | Disposition: A | Discharge status: Home / Self Care | Payer: BC Managed Care – PPO | Source: Ambulatory Visit | Attending: Internal Medicine | Admitting: Internal Medicine

## 2012-11-19 VITALS — BP 100/64 | HR 88 | Ht 62.0 in | Wt 124.0 lb

## 2012-11-19 DIAGNOSIS — Z902 Acquired absence of lung [part of]: Secondary | ICD-10-CM | POA: Insufficient documentation

## 2012-11-19 DIAGNOSIS — R609 Edema, unspecified: Secondary | ICD-10-CM

## 2012-11-19 DIAGNOSIS — Z79899 Other long term (current) drug therapy: Secondary | ICD-10-CM | POA: Insufficient documentation

## 2012-11-19 DIAGNOSIS — Z792 Long term (current) use of antibiotics: Secondary | ICD-10-CM | POA: Insufficient documentation

## 2012-11-19 DIAGNOSIS — C50219 Malignant neoplasm of upper-inner quadrant of unspecified female breast: Secondary | ICD-10-CM

## 2012-11-19 DIAGNOSIS — F17200 Nicotine dependence, unspecified, uncomplicated: Secondary | ICD-10-CM | POA: Insufficient documentation

## 2012-11-19 DIAGNOSIS — C50919 Malignant neoplasm of unspecified site of unspecified female breast: Secondary | ICD-10-CM | POA: Insufficient documentation

## 2012-11-19 DIAGNOSIS — R600 Localized edema: Secondary | ICD-10-CM

## 2012-11-19 DIAGNOSIS — C50212 Malignant neoplasm of upper-inner quadrant of left female breast: Secondary | ICD-10-CM

## 2012-11-19 DIAGNOSIS — Z09 Encounter for follow-up examination after completed treatment for conditions other than malignant neoplasm: Secondary | ICD-10-CM

## 2012-11-19 NOTE — Assessment & Plan Note (Signed)
Mild asymmetric edema of LLE. Mild varicosities in leg. Doubt related to HF. If persists will get u/s RLE to exclude DVT. Keep leg elevated when possible.

## 2012-11-19 NOTE — Assessment & Plan Note (Signed)
I reviewed echos personally. EF and Doppler parameters stable. No HF on exam. Continue Herceptin.   

## 2012-11-19 NOTE — Progress Notes (Signed)
Patient ID: CELA NEWCOM, female   DOB: 1966/06/03, 47 y.o.   MRN: [id number] Referring Physician: Dr. Welton Flakes Primary Care:  Primary Cardiologist: Oncologist: Dr Welton Flakes Pulmonologist: Dr. Sherene Sires   HPI: Ms. Hagan is a 47 y.o. female with a history of  coccidiomycosis s/p left upper lobectomy in 1997 and bronchiectasis  which she is on chronic abx therapy, Stage IIA invasive left sided breast cancer that was found to be ER/PR and Her-2/neu positive with Ki-67 elevated at 70%.  Current smoker.    She received neoadjuvant Taxotere carboplatinum Herceptin. Taxotere carboplatinum will be given every 21 days with weekly Herceptin. Total of 6 cycles of Taxotere and carboplatinum will be administered. (07/12/12) Plan to continue Herceptin for 1 year. Will finish in January 2015.     Echo:  06/25/12:  LVEF 55-60%, lat s' 11.3 09/20/12 EF 60-65% lat s' 11.4 11/19/12 EF  60% Lateral S' 10.5  She returns for follow up. She is tolerating Herceptin well. Only complaint is new onset ankle edema. L>R. No dyspnea, orthopnea or PND. Weight stable 122-126. Taking naprosyn 3-4x/week. No further steroids.     Review of Systems: All pertinent positives and negatives as in HPI, otherwise negative.    Past Medical History  Diagnosis Date  . Breast cancer   . Headache   . Back pain   . 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 [place] fever     Current Outpatient Prescriptions  Medication Sig Dispense Refill  . albuterol (VENTOLIN HFA) 108 (90 BASE) MCG/ACT inhaler Inhale 2 puffs into the lungs every 6 (six) hours as needed.        Marland Kitchen amoxicillin (AMOXIL) 500 MG capsule Take 1 capsule (500 mg total) by mouth 3 (three) times daily.  30 capsule  11  . fluticasone (FLONASE) 50 MCG/ACT nasal spray Place 2 sprays into the nose daily.  16 g  2  . lidocaine-prilocaine (EMLA) cream Apply topically as needed.  30 g  7  . naproxen sodium (ANAPROX) 220 MG tablet Take 220 mg by  mouth 2 (two) times daily with a meal.      . omeprazole (PRILOSEC) 40 MG capsule TAKE 1 CAPSULE BY MOUTH DAILY  30 capsule  2  . dexamethasone (DECADRON) 4 MG tablet Take 2 tablets (8 mg total) by mouth 2 (two) times daily with a meal. Take two times a day the day before Taxotere. Then take two times a day starting the day after chemo for 3 days.  30 tablet  4  . DPH-Lido-AlHydr-MgHydr-Simeth (FIRST-MOUTHWASH BLM) SUSP       . LORazepam (ATIVAN) 0.5 MG tablet TAKE 1 TABLET BY MOUTH EVERY 6 HOURS AS NEEDED FOR NAUSEA/VOMITING  30 tablet  0  . ondansetron (ZOFRAN) 8 MG tablet Take 1 tablet (8 mg total) by mouth 2 (two) times daily. Take two times a day starting the day after chemo for 3 days. Then take two times a day as needed for nausea or vomiting.  30 tablet  1  . prochlorperazine (COMPAZINE) 10 MG tablet Take 1 tablet (10 mg total) by mouth every 6 (six) hours as needed (Nausea or vomiting).  30 tablet  1  . valACYclovir (VALTREX) 500 MG tablet        No current facility-administered medications for this encounter.    Allergies  Allergen Reactions  . Aspirin     Unknown reaction  . Iodinated Diagnostic Agents  Hives    Pt states in [place] got hives and itching with IV contrast. Per pt allergy was to prev CT.    History   Social History  . Marital Status: Married    Spouse Name: N/A    Number of Children: 2  . Years of Education: N/A   Occupational History  . Property Manager    Social History Main Topics  . Smoking status: Current Every Day Smoker -- 0.50 packs/day for 24 years    Types: Cigarettes  . Smokeless tobacco: Never Used  . Alcohol Use: No  . Drug Use: No  . Sexually Active: Yes     Comment: trying to quit   Other Topics Concern  . Not on file   Social History Narrative  . No narrative on file     PHYSICAL EXAM: Filed Vitals:   11/19/12 0953  BP: 100/64  Pulse: 88  Height: 5\' 2"  (1.575 m)  Weight: 124 lb (56.246 kg)  SpO2: 100%     General:  Well appearing. No respiratory difficulty HEENT: normal Neck: supple. no JVD. Carotids 2+ bilat; no bruits. No lymphadenopathy or thryomegaly appreciated. Cor: PMI nondisplaced. Regular rate & rhythm. No rubs, gallops or murmurs. Lungs: coarse breath sounds Abdomen: soft, nontender, nondistended. No hepatosplenomegaly. No bruits or masses. Good bowel sounds. Extremities: no cyanosis, clubbing, rash, LLE trace - 1+ at ankle. Mild varicosities  RLE no edema Neuro: alert & oriented x 3, cranial nerves grossly intact. moves all 4 extremities w/o difficulty. Affect pleasant.    ASSESSMENT & PLAN:

## 2012-11-19 NOTE — Progress Notes (Signed)
  Echocardiogram 2D Echocardiogram has been performed.  Goerge Mohr FRANCES 11/19/2012, 10:03 AM

## 2012-11-19 NOTE — Patient Instructions (Addendum)
Follow up in 3 months with and ECHO and Dr Gala Romney

## 2012-11-23 ENCOUNTER — Ambulatory Visit (HOSPITAL_COMMUNITY)
Admission: RE | Admit: 2012-11-23 | Discharge: 2012-11-23 | Disposition: A | Discharge status: Home / Self Care | Payer: BC Managed Care – PPO | Source: Ambulatory Visit | Attending: Internal Medicine | Admitting: Internal Medicine

## 2012-11-23 ENCOUNTER — Telehealth (HOSPITAL_COMMUNITY): Payer: Self-pay | Admitting: *Deleted

## 2012-11-23 ENCOUNTER — Encounter (HOSPITAL_BASED_OUTPATIENT_CLINIC_OR_DEPARTMENT_OTHER): Payer: Self-pay | Admitting: *Deleted

## 2012-11-23 DIAGNOSIS — R609 Edema, unspecified: Secondary | ICD-10-CM

## 2012-11-23 DIAGNOSIS — C50919 Malignant neoplasm of unspecified site of unspecified female breast: Secondary | ICD-10-CM | POA: Insufficient documentation

## 2012-11-23 NOTE — Progress Notes (Signed)
Left lower extremity venous duplex completed.  Left:  No evidence of DVT, superficial thrombosis, or Baker's cyst.  Right:  Negative for DVT in the common femoral vein.  

## 2012-11-23 NOTE — Progress Notes (Signed)
Pt to have chemo day before surgery-11/29/12-will get cbc cmet again-told pt to be sure dr Welton Flakes sees her labs before she leaves and ok for lumpectomy the next day-her hgb 11/08/12 was 8.6

## 2012-11-23 NOTE — Telephone Encounter (Signed)
Pt called to state she is still having swelling in LLE, per Dr Gala Romney get venous duplex to r/o DVT, pt aware and appt sch for today at 12

## 2012-11-29 ENCOUNTER — Ambulatory Visit (HOSPITAL_BASED_OUTPATIENT_CLINIC_OR_DEPARTMENT_OTHER): Payer: BC Managed Care – PPO | Admitting: Adult Health

## 2012-11-29 ENCOUNTER — Telehealth: Payer: Self-pay | Admitting: Oncology

## 2012-11-29 ENCOUNTER — Encounter (HOSPITAL_COMMUNITY)
Admission: RE | Admit: 2012-11-29 | Discharge: 2012-11-29 | Disposition: A | Discharge status: Home / Self Care | Payer: BC Managed Care – PPO | Source: Ambulatory Visit | Attending: Oncology | Admitting: Oncology

## 2012-11-29 ENCOUNTER — Ambulatory Visit (HOSPITAL_BASED_OUTPATIENT_CLINIC_OR_DEPARTMENT_OTHER): Payer: BC Managed Care – PPO

## 2012-11-29 ENCOUNTER — Encounter: Payer: Self-pay | Admitting: Adult Health

## 2012-11-29 ENCOUNTER — Telehealth (INDEPENDENT_AMBULATORY_CARE_PROVIDER_SITE_OTHER): Payer: Self-pay | Admitting: General Surgery

## 2012-11-29 ENCOUNTER — Encounter: Payer: Self-pay | Admitting: Oncology

## 2012-11-29 ENCOUNTER — Other Ambulatory Visit (HOSPITAL_BASED_OUTPATIENT_CLINIC_OR_DEPARTMENT_OTHER): Payer: BC Managed Care – PPO | Admitting: Lab

## 2012-11-29 VITALS — BP 102/68 | HR 109 | Temp 98.3°F | Resp 20 | Ht 62.0 in | Wt 124.5 lb

## 2012-11-29 DIAGNOSIS — C50212 Malignant neoplasm of upper-inner quadrant of left female breast: Secondary | ICD-10-CM

## 2012-11-29 DIAGNOSIS — C50919 Malignant neoplasm of unspecified site of unspecified female breast: Secondary | ICD-10-CM

## 2012-11-29 DIAGNOSIS — G589 Mononeuropathy, unspecified: Secondary | ICD-10-CM

## 2012-11-29 DIAGNOSIS — D6481 Anemia due to antineoplastic chemotherapy: Secondary | ICD-10-CM | POA: Insufficient documentation

## 2012-11-29 DIAGNOSIS — Z5112 Encounter for antineoplastic immunotherapy: Secondary | ICD-10-CM

## 2012-11-29 DIAGNOSIS — T451X5A Adverse effect of antineoplastic and immunosuppressive drugs, initial encounter: Secondary | ICD-10-CM

## 2012-11-29 DIAGNOSIS — D649 Anemia, unspecified: Secondary | ICD-10-CM

## 2012-11-29 DIAGNOSIS — Z170 Estrogen receptor positive status [ER+]: Secondary | ICD-10-CM

## 2012-11-29 DIAGNOSIS — C50219 Malignant neoplasm of upper-inner quadrant of unspecified female breast: Secondary | ICD-10-CM

## 2012-11-29 LAB — COMPREHENSIVE METABOLIC PANEL (CC13)
Alkaline Phosphatase: 75 U/L (ref 40–150)
BUN: 9.2 mg/dL (ref 7.0–26.0)
Glucose: 104 mg/dl — ABNORMAL HIGH (ref 70–99)
Total Bilirubin: 0.27 mg/dL (ref 0.20–1.20)

## 2012-11-29 LAB — CBC WITH DIFFERENTIAL/PLATELET
Basophils Absolute: 0 10*3/uL (ref 0.0–0.1)
EOS%: 5.7 % (ref 0.0–7.0)
HCT: 20.8 % — ABNORMAL LOW (ref 34.8–46.6)
HGB: 7 g/dL — CL (ref 11.6–15.9)
MCH: 36.5 pg — ABNORMAL HIGH (ref 25.1–34.0)
MCHC: 33.7 g/dL (ref 31.5–36.0)
MCV: 108.3 fL — ABNORMAL HIGH (ref 79.5–101.0)
MONO%: 7.5 % (ref 0.0–14.0)
NEUT%: 44.8 % (ref 38.4–76.8)
RDW: 23 % — ABNORMAL HIGH (ref 11.2–14.5)

## 2012-11-29 MED ORDER — ACETAMINOPHEN 325 MG PO TABS
650.0000 mg | ORAL_TABLET | Freq: Once | ORAL | Status: AC
  Administered 2012-11-29: 650 mg via ORAL

## 2012-11-29 MED ORDER — HEPARIN SOD (PORK) LOCK FLUSH 100 UNIT/ML IV SOLN
500.0000 [IU] | Freq: Once | INTRAVENOUS | Status: AC
  Administered 2012-11-29: 500 [IU] via INTRAVENOUS
  Filled 2012-11-29: qty 5

## 2012-11-29 MED ORDER — SODIUM CHLORIDE 0.9 % IJ SOLN
10.0000 mL | INTRAMUSCULAR | Status: DC | PRN
  Administered 2012-11-29: 10 mL via INTRAVENOUS
  Filled 2012-11-29: qty 10

## 2012-11-29 MED ORDER — TRASTUZUMAB CHEMO INJECTION 440 MG
6.0000 mg/kg | Freq: Once | INTRAVENOUS | Status: AC
  Administered 2012-11-29: 336 mg via INTRAVENOUS
  Filled 2012-11-29: qty 16

## 2012-11-29 MED ORDER — DIPHENHYDRAMINE HCL 25 MG PO CAPS
50.0000 mg | ORAL_CAPSULE | Freq: Once | ORAL | Status: AC
  Administered 2012-11-29: 25 mg via ORAL

## 2012-11-29 NOTE — Telephone Encounter (Signed)
Meredith at [facility] called to see if there has been a response from Dr Johna Sheriff re: pt low hgb and surgery. I advised her that per epic note Dr Johna Sheriff stated he would call Dr Welton Flakes. Sharyl Nimrod will check with Dr Welton Flakes.

## 2012-11-29 NOTE — Patient Instructions (Addendum)
[facility] Discharge Instructions for Patients Receiving Chemotherapy  Today you received the following chemotherapy agents Herceptin  To help prevent nausea and vomiting after your treatment, we encourage you to take your nausea medication     If you develop nausea and vomiting that is not controlled by your nausea medication, call the clinic.   BELOW ARE SYMPTOMS THAT SHOULD BE REPORTED IMMEDIATELY:  *FEVER GREATER THAN 100.5 F  *CHILLS WITH OR WITHOUT FEVER  NAUSEA AND VOMITING THAT IS NOT CONTROLLED WITH YOUR NAUSEA MEDICATION  *UNUSUAL SHORTNESS OF BREATH  *UNUSUAL BRUISING OR BLEEDING  TENDERNESS IN MOUTH AND THROAT WITH OR WITHOUT PRESENCE OF ULCERS  *URINARY PROBLEMS  *BOWEL PROBLEMS  UNUSUAL RASH Items with * indicate a potential emergency and should be followed up as soon as possible.  Feel free to call the clinic you have any questions or concerns. The clinic phone number is [phone].    

## 2012-11-29 NOTE — Progress Notes (Signed)
OFFICE PROGRESS NOTE  CC  PCP: [name], MD   [name], MD 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  PRIOR THERAPY:  #1 patient was originally seen in the multidisciplinary breast clinic when she found a left breast mass she eventually had MRI and mammograms performed. She was diagnosed with invasive ductal carcinoma that was ER positive PR positive HER-2/neu positive measuring 3.1 cm by MRI criteria. Ki-67 was 70% HER-2 was amplified with a ratio 2.91.  #2 patient was seen at the multidisciplinary breast clinic and a recommendation for neoadjuvant chemotherapy was recommended as patient was interested in breast conservation. She will receive neoadjuvant Taxotere carboplatinum Herceptin. Taxotere carboplatinum will be given every 21 days with weekly Herceptin. Total of 6 cycles of Taxotere and carboplatinum will be administered.  This was started 07/12/12.  3 patient had MRI of the breasts performed on 10/15/2012 which does show reduction in the size of the left breast mass from 3.1to 1.2. This is a wonderful response.  CURRENT THERAPY:   Herceptin every 3 weeks  INTERVAL HISTORY: Patient returns in followup visit prior to every 3 week Herceptin.  She is feeling well today.  She has had a headache, minor palpitations, but otherwise well.  She is scheduled for surgery tomorrow, however surprised to find out that her hemoglobin is 7 and plt count 52. Otherwise, a 10 point ROS is neg.    Past Medical History  Diagnosis Date  . Breast cancer   . Headache   . Back pain   . 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 [place] fever     ALLERGIES:  is allergic to aspirin and iodinated diagnostic agents.  MEDICATIONS:  Current Outpatient  Prescriptions  Medication Sig Dispense Refill  . fluticasone (FLONASE) 50 MCG/ACT nasal spray Place 2 sprays into the nose daily.  16 g  2  . lidocaine-prilocaine (EMLA) cream Apply topically as needed.  30 g  7  . naproxen sodium (ANAPROX) 220 MG tablet Take 220 mg by mouth 2 (two) times daily with a meal.      . omeprazole (PRILOSEC) 40 MG capsule TAKE 1 CAPSULE BY MOUTH DAILY  30 capsule  2  . albuterol (VENTOLIN HFA) 108 (90 BASE) MCG/ACT inhaler Inhale 2 puffs into the lungs every 6 (six) hours as needed.        Marland Kitchen amoxicillin (AMOXIL) 500 MG capsule Take 1 capsule (500 mg total) by mouth 3 (three) times daily.  30 capsule  11  . cetirizine (ZYRTEC) 10 MG tablet Take 10 mg by mouth daily.      . valACYclovir (VALTREX) 500 MG tablet        No current facility-administered medications for this visit.    SURGICAL HISTORY:  Past Surgical History  Procedure Laterality Date  . 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 Cesarean section  1995/1996  . Lobectomy  1997    upper left  . Portacath placement  07/02/2012    Procedure: INSERTION PORT-A-CATH;  Surgeon: Mariella Saa, MD;  Location: [facility];  Service: General;  Laterality: N/A;  right  . Breast surgery  2006    cyst rt br-neg    REVIEW OF SYSTEMS:  General: fatigue (-), night sweats (-), fever (-), pain (-) Lymph: palpable nodes (-) HEENT: vision changes (-), mucositis (-), gum bleeding (-), epistaxis (-) Cardiovascular: chest pain (-), palpitations (-) Pulmonary: shortness of breath (-), dyspnea on exertion (-), cough (-), hemoptysis (-) GI:  Early satiety (-), melena (-), dysphagia (-), nausea/vomiting (-), diarrhea (-) GU: dysuria (-), hematuria (-), incontinence (-) Musculoskeletal: joint swelling (-), joint pain (-), back pain (-) Neuro: weakness (-), numbness (-), headache (-), confusion (-) Skin: Rash (-), lesions (-), dryness (-) Psych: depression (-), suicidal/homicidal  ideation (-), feeling of hopelessness (-)   PHYSICAL EXAMINATION: Blood pressure 102/68, pulse 109, temperature 98.3 F (36.8 C), temperature source Oral, resp. rate 20, height 5\' 2"  (1.575 m), weight 124 lb 8 oz (56.473 kg), last menstrual period 06/25/2012. Body mass index is 22.77 kg/(m^2). General: Patient is a well appearing female in no acute distress HEENT: PERRLA, sclerae anicteric no conjunctival pallor, MMM, ulceration in bilateral buccal mucosa, blistering in corners of mouth.   Neck: supple, no palpable adenopathy Lungs: mild wheezing in the left lung.  Right lung clear to auscultation Cardiovascular: regular rate rhythm, S1, S2, no murmurs, rubs or gallops Abdomen: Soft, non-tender, non-distended, normoactive bowel sounds, no HSM Extremities: warm and well perfused, no clubbing, cyanosis, or edema Skin: No rashes or lesions Neuro: Non-focal Breasts: The right breast is unremarkable. In the left breast in the superior lateral aspect up of the areola there is still a palpable mass measuring perhaps 1 cm. It is relatively soft. There is no erythema or other skin change and no nipple retraction. The left axilla was clear ECOG PERFORMANCE STATUS: 1  LABORATORY DATA: Lab Results  Component Value Date   WBC 5.1 11/29/2012   HGB 7.0 Repeated and Verified* 11/29/2012   HCT 20.8* 11/29/2012   MCV 108.3* 11/29/2012   PLT 52* 11/29/2012      Chemistry      Component Value Date/Time   NA 138 11/08/2012 1455   NA 141 01/26/2011 1039   K 3.5 11/08/2012 1455   K 4.5 01/26/2011 1039   CL 101 11/08/2012 1455   CL 106 01/26/2011 1039   CO2 27 11/08/2012 1455   CO2 28 01/26/2011 1039   BUN 8.1 11/08/2012 1455   BUN 7 01/26/2011 1039   CREATININE 0.8 11/08/2012 1455   CREATININE 0.6 01/26/2011 1039      Component Value Date/Time   CALCIUM 9.6 11/08/2012 1455   CALCIUM 9.2 01/26/2011 1039   ALKPHOS 97 11/08/2012 1455   AST 14 11/08/2012 1455   ALT 21 11/08/2012 1455   BILITOT 0.21 11/08/2012 1455        RADIOGRAPHIC STUDIES: Dg Chest 2 View  07/30/2012  *RADIOLOGY REPORT*  Clinical Data: Bronchiectasis.  History of breast cancer.  CHEST - 2 VIEW  Comparison: 07/02/2012 and 05/03/2011  Findings: Power port in place.  There is now an ill-defined 16 mm in density in the left lung base which correlates with the previous cystic lesion seen on CT scan of 01/26/2011.  There is central lucency.  I suspect this represents retraction and wall thickening of the previously seen area of cystic bronchiectasis.  Scarring at the left apex medially due to cystic bronchiectasis appears slightly less prominent.  The right lung is clear.  Heart size is normal.  Previous left upper lobectomy.  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  No other change since the prior exam.  Original Report Authenticated By: Francene Boyers, M.D.      ASSESSMENT: 47 year old [place] woman  with  #1 a clinical stage IIA (3.1 cm) invasive ductal carcinoma of the left breast status post needle core biopsy 06/05/2012 that showed ER positive PR positive HER-2/neu positive breast cancer with a Ki-67 at 70%.   #2 started chemotherapy on 07/13/2011 consisting of carboplatin, docetaxel and trastuzumab every 3 weeks, with the trastuzumab given weekly  #3 trastuzumab to be continued to complete a year;  most recent Echo 11/19/12  #4 Neuropathy   PLAN:   1. Ms. Dimaggio is feeling well today. She will proceed with Herceptin today.  She will return on Saturday for blood transfusion, and for weekly labs until recovery.    2. I have called Dr. Jamse Mead office to inform them of [facility] labs.  I left a message with the triage nurse who plans on informing one of Dr. Jamse Mead colleagues about the labs since he is out of the office this afternoon.   3. She will return in 3 weeks tentatively for an evaluation and herceptin.    All questions were answered. The patient knows to  call the clinic with any problems, questions or concerns. We can certainly see the patient much sooner if necessary.   I spent 25 minutes counseling the patient face to face. The total time spent in the appointment was 30 minutes.  Cherie Ouch Lyn Hollingshead, NP Medical Oncology [facility] Phone: [phone] 11/29/2012, 3:33 PM

## 2012-11-29 NOTE — Telephone Encounter (Signed)
, °

## 2012-11-29 NOTE — Telephone Encounter (Signed)
Lillia Abed, NP at [facility] called to make [name] aware of patient's low hemoglobin - she is scheduled for surgery tomorrow with Dr Johna Sheriff. Dr Johna Sheriff pm off. I reviewed Hemoglobin and platelet count with Dr Michaell Cowing. He spoke with Dr Johna Sheriff and Dr Johna Sheriff was going to call and speak with Dr Welton Flakes directly re: labs. Awaiting direction.

## 2012-11-29 NOTE — Patient Instructions (Addendum)
Blood Transfusion Information WHAT IS A BLOOD TRANSFUSION? A transfusion is the replacement of blood or some of its parts. Blood is made up of multiple cells which provide different functions.  Red blood cells carry oxygen and are used for blood loss replacement.  White blood cells fight against infection.  Platelets control bleeding.  Plasma helps clot blood.  Other blood products are available for specialized needs, such as hemophilia or other clotting disorders. BEFORE THE TRANSFUSION  Who gives blood for transfusions?   You may be able to donate blood to be used at a later date on yourself (autologous donation).  Relatives can be asked to donate blood. This is generally not any safer than if you have received blood from a stranger. The same precautions are taken to ensure safety when a relative's blood is donated.  Healthy volunteers who are fully evaluated to make sure their blood is safe. This is blood bank blood. Transfusion therapy is the safest it has ever been in the practice of medicine. Before blood is taken from a donor, a complete history is taken to make sure that person has no history of diseases nor engages in risky social behavior (examples are intravenous drug use or sexual activity with multiple partners). The donor's travel history is screened to minimize risk of transmitting infections, such as malaria. The donated blood is tested for signs of infectious diseases, such as HIV and hepatitis. The blood is then tested to be sure it is compatible with you in order to minimize the chance of a transfusion reaction. If you or a relative donates blood, this is often done in anticipation of surgery and is not appropriate for emergency situations. It takes many days to process the donated blood. RISKS AND COMPLICATIONS Although transfusion therapy is very safe and saves many lives, the main dangers of transfusion include:   Getting an infectious disease.  Developing a  transfusion reaction. This is an allergic reaction to something in the blood you were given. Every precaution is taken to prevent this. The decision to have a blood transfusion has been considered carefully by your caregiver before blood is given. Blood is not given unless the benefits outweigh the risks. AFTER THE TRANSFUSION  Right after receiving a blood transfusion, you will usually feel much better and more energetic. This is especially true if your red blood cells have gotten low (anemic). The transfusion raises the level of the red blood cells which carry oxygen, and this usually causes an energy increase.  The nurse administering the transfusion will monitor you carefully for complications. HOME CARE INSTRUCTIONS  No special instructions are needed after a transfusion. You may find your energy is better. Speak with your caregiver about any limitations on activity for underlying diseases you may have. SEEK MEDICAL CARE IF:   Your condition is not improving after your transfusion.  You develop redness or irritation at the intravenous (IV) site. SEEK IMMEDIATE MEDICAL CARE IF:  Any of the following symptoms occur over the next 12 hours:  Shaking chills.  You have a temperature by mouth above 102 F (38.9 C), not controlled by medicine.  Chest, back, or muscle pain.  People around you feel you are not acting correctly or are confused.  Shortness of breath or difficulty breathing.  Dizziness and fainting.  You get a rash or develop hives.  You have a decrease in urine output.  Your urine turns a dark color or changes to pink, red, or brown. Any of the following   symptoms occur over the next 10 days:  You have a temperature by mouth above 102 F (38.9 C), not controlled by medicine.  Shortness of breath.  Weakness after normal activity.  The white part of the eye turns yellow (jaundice).  You have a decrease in the amount of urine or are urinating less often.  Your  urine turns a dark color or changes to pink, red, or brown. Document Released: 06/24/2000 Document Revised: 09/19/2011 Document Reviewed: 02/11/2008 ExitCare Patient Information 2014 [employer].  

## 2012-11-30 ENCOUNTER — Telehealth: Payer: Self-pay | Admitting: *Deleted

## 2012-11-30 ENCOUNTER — Ambulatory Visit (HOSPITAL_COMMUNITY): Payer: BC Managed Care – PPO

## 2012-11-30 NOTE — Telephone Encounter (Signed)
Per staff message and POF I have scheduled appts.  JMW  

## 2012-12-01 ENCOUNTER — Ambulatory Visit (HOSPITAL_BASED_OUTPATIENT_CLINIC_OR_DEPARTMENT_OTHER): Payer: BC Managed Care – PPO

## 2012-12-01 VITALS — BP 98/60 | HR 69 | Temp 98.5°F

## 2012-12-01 DIAGNOSIS — T451X5A Adverse effect of antineoplastic and immunosuppressive drugs, initial encounter: Secondary | ICD-10-CM

## 2012-12-01 DIAGNOSIS — D649 Anemia, unspecified: Secondary | ICD-10-CM

## 2012-12-01 MED ORDER — SODIUM CHLORIDE 0.9 % IV SOLN: 250.0000 mL | Freq: Once | INTRAVENOUS | Status: DC

## 2012-12-01 MED ORDER — SODIUM CHLORIDE 0.9 % IJ SOLN
10.0000 mL | INTRAMUSCULAR | Status: AC | PRN
  Administered 2012-12-01: 10 mL
  Filled 2012-12-01: qty 10

## 2012-12-01 MED ORDER — ACETAMINOPHEN 325 MG PO TABS
650.0000 mg | ORAL_TABLET | Freq: Once | ORAL | Status: AC
  Administered 2012-12-01: 650 mg via ORAL

## 2012-12-01 MED ORDER — DIPHENHYDRAMINE HCL 25 MG PO CAPS
25.0000 mg | ORAL_CAPSULE | Freq: Once | ORAL | Status: AC
  Administered 2012-12-01: 25 mg via ORAL

## 2012-12-01 MED ORDER — HEPARIN SOD (PORK) LOCK FLUSH 100 UNIT/ML IV SOLN
250.0000 [IU] | INTRAVENOUS | Status: DC | PRN
  Filled 2012-12-01: qty 5

## 2012-12-01 MED ORDER — SODIUM CHLORIDE 0.9 % IV SOLN
250.0000 mL | Freq: Once | INTRAVENOUS | Status: AC
  Administered 2012-12-01: 250 mL via INTRAVENOUS

## 2012-12-01 NOTE — Patient Instructions (Signed)
Blood Transfusion Information WHAT IS A BLOOD TRANSFUSION? A transfusion is the replacement of blood or some of its parts. Blood is made up of multiple cells which provide different functions.  Red blood cells carry oxygen and are used for blood loss replacement.  White blood cells fight against infection.  Platelets control bleeding.  Plasma helps clot blood.  Other blood products are available for specialized needs, such as hemophilia or other clotting disorders. BEFORE THE TRANSFUSION  Who gives blood for transfusions?   You may be able to donate blood to be used at a later date on yourself (autologous donation).  Relatives can be asked to donate blood. This is generally not any safer than if you have received blood from a stranger. The same precautions are taken to ensure safety when a relative's blood is donated.  Healthy volunteers who are fully evaluated to make sure their blood is safe. This is blood bank blood. Transfusion therapy is the safest it has ever been in the practice of medicine. Before blood is taken from a donor, a complete history is taken to make sure that person has no history of diseases nor engages in risky social behavior (examples are intravenous drug use or sexual activity with multiple partners). The donor's travel history is screened to minimize risk of transmitting infections, such as malaria. The donated blood is tested for signs of infectious diseases, such as HIV and hepatitis. The blood is then tested to be sure it is compatible with you in order to minimize the chance of a transfusion reaction. If you or a relative donates blood, this is often done in anticipation of surgery and is not appropriate for emergency situations. It takes many days to process the donated blood. RISKS AND COMPLICATIONS Although transfusion therapy is very safe and saves many lives, the main dangers of transfusion include:   Getting an infectious disease.  Developing a  transfusion reaction. This is an allergic reaction to something in the blood you were given. Every precaution is taken to prevent this. The decision to have a blood transfusion has been considered carefully by your caregiver before blood is given. Blood is not given unless the benefits outweigh the risks. AFTER THE TRANSFUSION  Right after receiving a blood transfusion, you will usually feel much better and more energetic. This is especially true if your red blood cells have gotten low (anemic). The transfusion raises the level of the red blood cells which carry oxygen, and this usually causes an energy increase.  The nurse administering the transfusion will monitor you carefully for complications. HOME CARE INSTRUCTIONS  No special instructions are needed after a transfusion. You may find your energy is better. Speak with your caregiver about any limitations on activity for underlying diseases you may have. SEEK MEDICAL CARE IF:   Your condition is not improving after your transfusion.  You develop redness or irritation at the intravenous (IV) site. SEEK IMMEDIATE MEDICAL CARE IF:  Any of the following symptoms occur over the next 12 hours:  Shaking chills.  You have a temperature by mouth above 102 F (38.9 C), not controlled by medicine.  Chest, back, or muscle pain.  People around you feel you are not acting correctly or are confused.  Shortness of breath or difficulty breathing.  Dizziness and fainting.  You get a rash or develop hives.  You have a decrease in urine output.  Your urine turns a dark color or changes to pink, red, or brown. Any of the following   symptoms occur over the next 10 days:  You have a temperature by mouth above 102 F (38.9 C), not controlled by medicine.  Shortness of breath.  Weakness after normal activity.  The white part of the eye turns yellow (jaundice).  You have a decrease in the amount of urine or are urinating less often.  Your  urine turns a dark color or changes to pink, red, or brown. Document Released: 06/24/2000 Document Revised: 09/19/2011 Document Reviewed: 02/11/2008 Exit[employer] Patient Information 2014 [employer].  

## 2012-12-01 NOTE — Progress Notes (Signed)
Pre blood vitals. 

## 2012-12-02 LAB — TYPE AND SCREEN
ABO/RH(D): O POS
Antibody Screen: NEGATIVE
Unit division: 0

## 2012-12-06 ENCOUNTER — Other Ambulatory Visit (HOSPITAL_BASED_OUTPATIENT_CLINIC_OR_DEPARTMENT_OTHER): Payer: BC Managed Care – PPO

## 2012-12-06 DIAGNOSIS — C50219 Malignant neoplasm of upper-inner quadrant of unspecified female breast: Secondary | ICD-10-CM

## 2012-12-06 LAB — COMPREHENSIVE METABOLIC PANEL (CC13)
ALT: 26 U/L (ref 0–55)
AST: 21 U/L (ref 5–34)
Alkaline Phosphatase: 90 U/L (ref 40–150)
Sodium: 140 mEq/L (ref 136–145)
Total Bilirubin: 0.23 mg/dL (ref 0.20–1.20)
Total Protein: 6.9 g/dL (ref 6.4–8.3)

## 2012-12-06 LAB — CBC WITH DIFFERENTIAL/PLATELET
BASO%: 0.9 % (ref 0.0–2.0)
LYMPH%: 40.4 % (ref 14.0–49.7)
MCHC: 35.2 g/dL (ref 31.5–36.0)
MONO#: 0.3 10*3/uL (ref 0.1–0.9)
Platelets: 144 10*3/uL — ABNORMAL LOW (ref 145–400)
RBC: 3.04 10*6/uL — ABNORMAL LOW (ref 3.70–5.45)
WBC: 3.6 10*3/uL — ABNORMAL LOW (ref 3.9–10.3)

## 2012-12-07 ENCOUNTER — Telehealth: Payer: Self-pay | Admitting: Oncology

## 2012-12-07 ENCOUNTER — Other Ambulatory Visit: Payer: Self-pay | Admitting: Oncology

## 2012-12-07 DIAGNOSIS — C50919 Malignant neoplasm of unspecified site of unspecified female breast: Secondary | ICD-10-CM

## 2012-12-07 NOTE — Telephone Encounter (Signed)
, °

## 2012-12-13 ENCOUNTER — Other Ambulatory Visit (HOSPITAL_BASED_OUTPATIENT_CLINIC_OR_DEPARTMENT_OTHER): Payer: BC Managed Care – PPO | Admitting: Lab

## 2012-12-13 ENCOUNTER — Other Ambulatory Visit: Payer: BC Managed Care – PPO | Admitting: Lab

## 2012-12-13 DIAGNOSIS — C50219 Malignant neoplasm of upper-inner quadrant of unspecified female breast: Secondary | ICD-10-CM

## 2012-12-13 DIAGNOSIS — C50919 Malignant neoplasm of unspecified site of unspecified female breast: Secondary | ICD-10-CM

## 2012-12-13 LAB — COMPREHENSIVE METABOLIC PANEL (CC13)
AST: 27 U/L (ref 5–34)
Albumin: 3.5 g/dL (ref 3.5–5.0)
Alkaline Phosphatase: 79 U/L (ref 40–150)
BUN: 8.6 mg/dL (ref 7.0–26.0)
Potassium: 4 mEq/L (ref 3.5–5.1)
Sodium: 142 mEq/L (ref 136–145)
Total Bilirubin: 0.27 mg/dL (ref 0.20–1.20)
Total Protein: 6.8 g/dL (ref 6.4–8.3)

## 2012-12-13 LAB — CBC WITH DIFFERENTIAL/PLATELET
EOS%: 6.2 % (ref 0.0–7.0)
MCHC: 33.4 g/dL (ref 31.5–36.0)
MCV: 105 fL — ABNORMAL HIGH (ref 79.5–101.0)
MONO%: 8.8 % (ref 0.0–14.0)
Platelets: 287 10*3/uL (ref 145–400)
RBC: 3.19 10*6/uL — ABNORMAL LOW (ref 3.70–5.45)

## 2012-12-20 ENCOUNTER — Telehealth: Payer: Self-pay | Admitting: *Deleted

## 2012-12-20 ENCOUNTER — Ambulatory Visit (HOSPITAL_BASED_OUTPATIENT_CLINIC_OR_DEPARTMENT_OTHER): Payer: BC Managed Care – PPO | Admitting: Lab

## 2012-12-20 ENCOUNTER — Encounter (INDEPENDENT_AMBULATORY_CARE_PROVIDER_SITE_OTHER): Payer: BC Managed Care – PPO | Admitting: General Surgery

## 2012-12-20 ENCOUNTER — Ambulatory Visit (HOSPITAL_BASED_OUTPATIENT_CLINIC_OR_DEPARTMENT_OTHER): Payer: BC Managed Care – PPO | Admitting: Adult Health

## 2012-12-20 ENCOUNTER — Ambulatory Visit (HOSPITAL_COMMUNITY)
Admission: RE | Admit: 2012-12-20 | Discharge: 2012-12-20 | Disposition: A | Discharge status: Home / Self Care | Payer: BC Managed Care – PPO | Source: Ambulatory Visit | Attending: Adult Health | Admitting: Adult Health

## 2012-12-20 ENCOUNTER — Ambulatory Visit (HOSPITAL_BASED_OUTPATIENT_CLINIC_OR_DEPARTMENT_OTHER): Payer: BC Managed Care – PPO

## 2012-12-20 DIAGNOSIS — R05 Cough: Secondary | ICD-10-CM | POA: Insufficient documentation

## 2012-12-20 DIAGNOSIS — Z5112 Encounter for antineoplastic immunotherapy: Secondary | ICD-10-CM

## 2012-12-20 DIAGNOSIS — R918 Other nonspecific abnormal finding of lung field: Secondary | ICD-10-CM | POA: Insufficient documentation

## 2012-12-20 DIAGNOSIS — J069 Acute upper respiratory infection, unspecified: Secondary | ICD-10-CM

## 2012-12-20 DIAGNOSIS — R509 Fever, unspecified: Secondary | ICD-10-CM | POA: Insufficient documentation

## 2012-12-20 DIAGNOSIS — C50219 Malignant neoplasm of upper-inner quadrant of unspecified female breast: Secondary | ICD-10-CM

## 2012-12-20 DIAGNOSIS — C50212 Malignant neoplasm of upper-inner quadrant of left female breast: Secondary | ICD-10-CM

## 2012-12-20 DIAGNOSIS — C50419 Malignant neoplasm of upper-outer quadrant of unspecified female breast: Secondary | ICD-10-CM

## 2012-12-20 DIAGNOSIS — Z170 Estrogen receptor positive status [ER+]: Secondary | ICD-10-CM

## 2012-12-20 DIAGNOSIS — R059 Cough, unspecified: Secondary | ICD-10-CM | POA: Insufficient documentation

## 2012-12-20 DIAGNOSIS — C50919 Malignant neoplasm of unspecified site of unspecified female breast: Secondary | ICD-10-CM | POA: Insufficient documentation

## 2012-12-20 DIAGNOSIS — R062 Wheezing: Secondary | ICD-10-CM

## 2012-12-20 LAB — COMPREHENSIVE METABOLIC PANEL (CC13)
ALT: 18 U/L (ref 0–55)
CO2: 27 mEq/L (ref 22–29)
Calcium: 9.8 mg/dL (ref 8.4–10.4)
Chloride: 100 mEq/L (ref 98–107)
Sodium: 138 mEq/L (ref 136–145)
Total Protein: 7.7 g/dL (ref 6.4–8.3)

## 2012-12-20 LAB — CBC WITH DIFFERENTIAL/PLATELET
BASO%: 0.6 % (ref 0.0–2.0)
Eosinophils Absolute: 0.2 10*3/uL (ref 0.0–0.5)
HCT: 33.6 % — ABNORMAL LOW (ref 34.8–46.6)
MCHC: 34.8 g/dL (ref 31.5–36.0)
MONO#: 1 10*3/uL — ABNORMAL HIGH (ref 0.1–0.9)
NEUT#: 5.6 10*3/uL (ref 1.5–6.5)
NEUT%: 67.1 % (ref 38.4–76.8)
WBC: 8.4 10*3/uL (ref 3.9–10.3)
lymph#: 1.5 10*3/uL (ref 0.9–3.3)

## 2012-12-20 MED ORDER — SODIUM CHLORIDE 0.9 % IJ SOLN
10.0000 mL | INTRAMUSCULAR | Status: DC | PRN
  Filled 2012-12-20: qty 10

## 2012-12-20 MED ORDER — ALBUTEROL SULFATE HFA 108 (90 BASE) MCG/ACT IN AERS: 2.0000 | INHALATION_SPRAY | Freq: Four times a day (QID) | RESPIRATORY_TRACT | Status: DC | PRN

## 2012-12-20 MED ORDER — DIPHENHYDRAMINE HCL 25 MG PO CAPS
50.0000 mg | ORAL_CAPSULE | Freq: Once | ORAL | Status: AC
  Administered 2012-12-20: 25 mg via ORAL

## 2012-12-20 MED ORDER — ACETAMINOPHEN 325 MG PO TABS
650.0000 mg | ORAL_TABLET | Freq: Once | ORAL | Status: AC
  Administered 2012-12-20: 650 mg via ORAL

## 2012-12-20 MED ORDER — HYDROCOD POLST-CHLORPHEN POLST 10-8 MG/5ML PO LQCR: 5.0000 mL | Freq: Every evening | ORAL | Status: DC | PRN

## 2012-12-20 MED ORDER — TRASTUZUMAB CHEMO INJECTION 440 MG
6.0000 mg/kg | Freq: Once | INTRAVENOUS | Status: AC
  Administered 2012-12-20: 336 mg via INTRAVENOUS
  Filled 2012-12-20: qty 16

## 2012-12-20 MED ORDER — LEVOFLOXACIN 750 MG PO TABS: 750.0000 mg | ORAL_TABLET | Freq: Every day | ORAL | Status: DC

## 2012-12-20 MED ORDER — HEPARIN SOD (PORK) LOCK FLUSH 100 UNIT/ML IV SOLN
500.0000 [IU] | Freq: Once | INTRAVENOUS | Status: AC | PRN
  Filled 2012-12-20: qty 5

## 2012-12-20 MED ORDER — SODIUM CHLORIDE 0.9 % IV SOLN
Freq: Once | INTRAVENOUS | Status: AC
  Administered 2012-12-20: 16:00:00 via INTRAVENOUS

## 2012-12-20 NOTE — Telephone Encounter (Signed)
Per staff message and POF I have scheduled appts.  JMW  

## 2012-12-20 NOTE — Telephone Encounter (Signed)
appts made and printed. pt is aware that tx will follow after seeing LA on 01/15/13. i emailed MW to add tx. Pt is aware to go have her cxr completed...td

## 2012-12-20 NOTE — Patient Instructions (Signed)
Take Mucinex for the cough.  We will get a chest x ray to evaluate your lungs.  Please call us if you have any questions or concerns.

## 2012-12-21 ENCOUNTER — Encounter: Payer: Self-pay | Admitting: Adult Health

## 2012-12-21 NOTE — Progress Notes (Signed)
OFFICE PROGRESS NOTE  CC  PCP: [name], MD   [name], MD 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  PRIOR THERAPY:  #1 patient was originally seen in the multidisciplinary breast clinic when she found a left breast mass she eventually had MRI and mammograms performed. She was diagnosed with invasive ductal carcinoma that was ER positive PR positive HER-2/neu positive measuring 3.1 cm by MRI criteria. Ki-67 was 70% HER-2 was amplified with a ratio 2.91.  #2 patient was seen at the multidisciplinary breast clinic and a recommendation for neoadjuvant chemotherapy was recommended as patient was interested in breast conservation. She will receive neoadjuvant Taxotere carboplatinum Herceptin. Taxotere carboplatinum will be given every 21 days with weekly Herceptin. Total of 6 cycles of Taxotere and carboplatinum will be administered.  This was started 07/12/12.  3 patient had MRI of the breasts performed on 10/15/2012 which does show reduction in the size of the left breast mass from 3.1to 1.2. This is a wonderful response.  4. Patient begun on every 3 week herceptin beginning   CURRENT THERAPY:   Herceptin every 3 weeks  INTERVAL HISTORY: Patient returns in followup visit prior to every 3 week Herceptin.  She not feeling well today.  About two days ago she developed mild shortness of breath, a non-productive cough, she's been having mild nasal drainage, and subjective fevers, though never took her temperature.  She feels achy in her chest and back due to the coughing.  She otherwise is doing well, and is scheduled for surgery on 6/23.  She denies orthopnea, PND, swelling in her extremities, nausea, vomiting, diarrhea, constipation, numbness, or any further concerns.    Past Medical History   Diagnosis Date  . Breast cancer   . Headache(784.0)   . Back pain   . 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 [place] fever     ALLERGIES:  is allergic to aspirin and iodinated diagnostic agents.  MEDICATIONS:  Current Outpatient Prescriptions  Medication Sig Dispense Refill  . albuterol (VENTOLIN HFA) 108 (90 BASE) MCG/ACT inhaler Inhale 2 puffs into the lungs every 6 (six) hours as needed.  1 Inhaler  3  . amoxicillin (AMOXIL) 500 MG capsule Take 1 capsule (500 mg total) by mouth 3 (three) times daily.  30 capsule  11  . cetirizine (ZYRTEC) 10 MG tablet Take 10 mg by mouth daily.      . chlorpheniramine-HYDROcodone (TUSSIONEX) 10-8 MG/5ML LQCR Take 5 mLs by mouth at bedtime as needed.  140 mL  0  . fluticasone (FLONASE) 50 MCG/ACT nasal spray Place 2 sprays into the nose daily.  16 g  2  . levofloxacin (LEVAQUIN) 750 MG tablet Take 1 tablet (750 mg total) by mouth daily.  14 tablet  0  . lidocaine-prilocaine (EMLA) cream Apply topically as needed.  30 g  7  . naproxen sodium (ANAPROX) 220 MG tablet Take 220 mg by mouth 2 (two) times daily with a meal.      . omeprazole (PRILOSEC) 40 MG capsule TAKE 1 CAPSULE BY MOUTH DAILY  30 capsule  2  . valACYclovir (VALTREX) 500 MG tablet        No current facility-administered medications for this visit.   Facility-Administered Medications Ordered in Other Visits  Medication Dose  Route Frequency Provider Last Rate Last Dose  . sodium chloride 0.9 % injection 10 mL  10 mL Intracatheter PRN Victorino December, MD        SURGICAL HISTORY:  Past Surgical History  Procedure Laterality Date  . 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 Cesarean section  1995/1996  . Lobectomy  1997    upper left  . Portacath placement  07/02/2012    Procedure: INSERTION PORT-A-CATH;  Surgeon: Mariella Saa, MD;  Location: [facility];  Service: General;  Laterality:  N/A;  right  . Breast surgery  2006    cyst rt br-neg    REVIEW OF SYSTEMS:   General: fatigue (-), night sweats (-), fever (-), pain (-) Lymph: palpable nodes (-) HEENT: vision changes (-), mucositis (-), gum bleeding (-), epistaxis (-) Cardiovascular: chest pain (-), palpitations (-) Pulmonary: shortness of breath (-), dyspnea on exertion (-), cough (-), hemoptysis (-) GI:  Early satiety (-), melena (-), dysphagia (-), nausea/vomiting (-), diarrhea (-) GU: dysuria (-), hematuria (-), incontinence (-) Musculoskeletal: joint swelling (-), joint pain (-), back pain (-) Neuro: weakness (-), numbness (-), headache (-), confusion (-) Skin: Rash (-), lesions (-), dryness (-) Psych: depression (-), suicidal/homicidal ideation (-), feeling of hopelessness (-)   PHYSICAL EXAMINATION: There were no vitals taken for this visit. There is no weight on file to calculate BMI. General: Patient is a well appearing female in no acute distress HEENT: PERRLA, sclerae anicteric no conjunctival pallor, MMM, ulceration in bilateral buccal mucosa, blistering in corners of mouth.   Neck: supple, no palpable adenopathy Lungs: mild wheezing in the left lung.  Right lung clear to auscultation Cardiovascular: regular rate rhythm, S1, S2, no murmurs, rubs or gallops Abdomen: Soft, non-tender, non-distended, normoactive bowel sounds, no HSM Extremities: warm and well perfused, no clubbing, cyanosis, or edema Skin: No rashes or lesions Neuro: Non-focal Breasts: The right breast is unremarkable. In the left breast in the superior lateral aspect up of the areola there is still a palpable mass measuring perhaps 1 cm. It is relatively soft. There is no erythema or other skin change and no nipple retraction. The left axilla was clear ECOG PERFORMANCE STATUS: 1  LABORATORY DATA: Lab Results  Component Value Date   WBC 8.4 12/20/2012   HGB 11.7 12/20/2012   HCT 33.6* 12/20/2012   MCV 105.8* 12/20/2012   PLT 301  12/20/2012      Chemistry      Component Value Date/Time   NA 138 12/20/2012 1509   NA 141 01/26/2011 1039   K 4.1 12/20/2012 1509   K 4.5 01/26/2011 1039   CL 100 12/20/2012 1509   CL 106 01/26/2011 1039   CO2 27 12/20/2012 1509   CO2 28 01/26/2011 1039   BUN 6.1* 12/20/2012 1509   BUN 7 01/26/2011 1039   CREATININE 0.9 12/20/2012 1509   CREATININE 0.6 01/26/2011 1039      Component Value Date/Time   CALCIUM 9.8 12/20/2012 1509   CALCIUM 9.2 01/26/2011 1039   ALKPHOS 81 12/20/2012 1509   AST 14 12/20/2012 1509   ALT 18 12/20/2012 1509   BILITOT 0.30 12/20/2012 1509       RADIOGRAPHIC STUDIES: Dg Chest 2 View  07/30/2012  *RADIOLOGY REPORT*  Clinical Data: Bronchiectasis.  History of breast cancer.  CHEST - 2 VIEW  Comparison: 07/02/2012 and 05/03/2011  Findings: Power port in place.  There is now an ill-defined  16 mm in density in the left lung base which correlates with the previous cystic lesion seen on CT scan of 01/26/2011.  There is central lucency.  I suspect this represents retraction and wall thickening of the previously seen area of cystic bronchiectasis.  Scarring at the left apex medially due to cystic bronchiectasis appears slightly less prominent.  The right lung is clear.  Heart size is normal.  Previous left upper lobectomy.  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  No other change since the prior exam.   Original Report Authenticated By: Francene Boyers, M.D.      ASSESSMENT: 47 year old [place] woman  with  #1 a clinical stage IIA (3.1 cm) invasive ductal carcinoma of the left breast status post needle core biopsy 06/05/2012 that showed ER positive PR positive HER-2/neu positive breast cancer with a Ki-67 at 70%.   #2 started chemotherapy on 07/13/2011 consisting of carboplatin, docetaxel and trastuzumab every 3 weeks, with the trastuzumab given weekly  #3 trastuzumab to be continued to complete a year;  most  recent Echo 11/19/12  PLAN:   1. Ms. Mangiapane is feeling well today. She will proceed with Herceptin today.  I ordered a chest x ray, and started her on levaquin empirically for possible infection.  I also refilled her inhaler and prescribed Tussionex for the cough at night.  I recommended she take Mucinex during the day.      2. Her surgery is scheduled for 12/31/12.    3. She will return on 7/18 for labs, evaluation and herceptin.    All questions were answered. The patient knows to call the clinic with any problems, questions or concerns. We can certainly see the patient much sooner if necessary.   I spent 25 minutes counseling the patient face to face. The total time spent in the appointment was 30 minutes.  Cherie Ouch Lyn Hollingshead, NP Medical Oncology [facility] Phone: [phone] 12/21/2012, 9:14 AM

## 2012-12-25 ENCOUNTER — Encounter (HOSPITAL_BASED_OUTPATIENT_CLINIC_OR_DEPARTMENT_OTHER): Payer: Self-pay | Admitting: *Deleted

## 2012-12-25 NOTE — Progress Notes (Signed)
Labs done 12/20/12-hgb now 11.7-

## 2012-12-26 NOTE — Progress Notes (Signed)
Stop time for herceptin and saline was not entered by primary nurse. Unable to close encounter.  Herceptin and saline end time entered per md's order.  HL

## 2012-12-27 ENCOUNTER — Other Ambulatory Visit: Payer: Self-pay | Admitting: Certified Registered Nurse Anesthetist

## 2012-12-31 ENCOUNTER — Encounter (HOSPITAL_BASED_OUTPATIENT_CLINIC_OR_DEPARTMENT_OTHER): Payer: Self-pay | Admitting: *Deleted

## 2012-12-31 ENCOUNTER — Ambulatory Visit (HOSPITAL_BASED_OUTPATIENT_CLINIC_OR_DEPARTMENT_OTHER)
Admission: RE | Admit: 2012-12-31 | Discharge: 2012-12-31 | Disposition: A | Discharge status: Home / Self Care | Payer: BC Managed Care – PPO | Source: Ambulatory Visit | Attending: General Surgery | Admitting: General Surgery

## 2012-12-31 ENCOUNTER — Encounter (HOSPITAL_COMMUNITY)
Admission: RE | Admit: 2012-12-31 | Discharge: 2012-12-31 | Disposition: A | Discharge status: Home / Self Care | Payer: BC Managed Care – PPO | Source: Ambulatory Visit | Attending: General Surgery | Admitting: General Surgery

## 2012-12-31 ENCOUNTER — Ambulatory Visit
Admission: RE | Admit: 2012-12-31 | Discharge: 2012-12-31 | Disposition: A | Discharge status: Home / Self Care | Payer: BC Managed Care – PPO | Source: Ambulatory Visit | Attending: General Surgery | Admitting: General Surgery

## 2012-12-31 ENCOUNTER — Encounter (HOSPITAL_BASED_OUTPATIENT_CLINIC_OR_DEPARTMENT_OTHER)
Admission: RE | Disposition: A | Discharge status: Home / Self Care | Payer: Self-pay | Source: Ambulatory Visit | Attending: General Surgery

## 2012-12-31 ENCOUNTER — Encounter (HOSPITAL_BASED_OUTPATIENT_CLINIC_OR_DEPARTMENT_OTHER): Payer: Self-pay | Admitting: Anesthesiology

## 2012-12-31 ENCOUNTER — Ambulatory Visit (HOSPITAL_BASED_OUTPATIENT_CLINIC_OR_DEPARTMENT_OTHER): Payer: BC Managed Care – PPO | Admitting: Anesthesiology

## 2012-12-31 DIAGNOSIS — C50212 Malignant neoplasm of upper-inner quadrant of left female breast: Secondary | ICD-10-CM

## 2012-12-31 DIAGNOSIS — Z902 Acquired absence of lung [part of]: Secondary | ICD-10-CM | POA: Insufficient documentation

## 2012-12-31 DIAGNOSIS — Z8619 Personal history of other infectious and parasitic diseases: Secondary | ICD-10-CM | POA: Insufficient documentation

## 2012-12-31 DIAGNOSIS — C50919 Malignant neoplasm of unspecified site of unspecified female breast: Secondary | ICD-10-CM | POA: Insufficient documentation

## 2012-12-31 DIAGNOSIS — D6481 Anemia due to antineoplastic chemotherapy: Secondary | ICD-10-CM

## 2012-12-31 DIAGNOSIS — J45909 Unspecified asthma, uncomplicated: Secondary | ICD-10-CM | POA: Insufficient documentation

## 2012-12-31 DIAGNOSIS — D0590 Unspecified type of carcinoma in situ of unspecified breast: Secondary | ICD-10-CM

## 2012-12-31 DIAGNOSIS — F17200 Nicotine dependence, unspecified, uncomplicated: Secondary | ICD-10-CM | POA: Insufficient documentation

## 2012-12-31 DIAGNOSIS — Z170 Estrogen receptor positive status [ER+]: Secondary | ICD-10-CM | POA: Insufficient documentation

## 2012-12-31 DIAGNOSIS — Z79899 Other long term (current) drug therapy: Secondary | ICD-10-CM | POA: Insufficient documentation

## 2012-12-31 HISTORY — PX: BREAST LUMPECTOMY WITH NEEDLE LOCALIZATION AND AXILLARY SENTINEL LYMPH NODE BX: SHX5760

## 2012-12-31 SURGERY — BREAST LUMPECTOMY WITH NEEDLE LOCALIZATION AND AXILLARY SENTINEL LYMPH NODE BX
Anesthesia: General | Site: Breast | Laterality: Left | Wound class: Clean

## 2012-12-31 MED ORDER — BUPIVACAINE-EPINEPHRINE 0.25% -1:200000 IJ SOLN
INTRAMUSCULAR | Status: DC | PRN
  Administered 2012-12-31: 20 mL

## 2012-12-31 MED ORDER — HEPARIN SOD (PORK) LOCK FLUSH 100 UNIT/ML IV SOLN: 500.0000 [IU] | Freq: Every day | INTRAVENOUS | Status: DC | PRN

## 2012-12-31 MED ORDER — LACTATED RINGERS IV SOLN
INTRAVENOUS | Status: DC
  Administered 2012-12-31 (×3): via INTRAVENOUS

## 2012-12-31 MED ORDER — OXYCODONE HCL 5 MG PO TABS
5.0000 mg | ORAL_TABLET | Freq: Once | ORAL | Status: AC | PRN
  Administered 2012-12-31: 5 mg via ORAL

## 2012-12-31 MED ORDER — ONDANSETRON 8 MG PO TBDP
8.0000 mg | ORAL_TABLET | Freq: Once | ORAL | Status: AC
  Administered 2012-12-31: 8 mg via ORAL

## 2012-12-31 MED ORDER — HYDROMORPHONE HCL PF 1 MG/ML IJ SOLN
0.2500 mg | INTRAMUSCULAR | Status: DC | PRN
  Administered 2012-12-31 (×2): 0.5 mg via INTRAVENOUS

## 2012-12-31 MED ORDER — SODIUM CHLORIDE 0.9 % IJ SOLN
INTRAMUSCULAR | Status: DC | PRN
  Administered 2012-12-31: 13:00:00

## 2012-12-31 MED ORDER — EPHEDRINE SULFATE 50 MG/ML IJ SOLN
INTRAMUSCULAR | Status: DC | PRN
  Administered 2012-12-31 (×2): 10 mg via INTRAVENOUS

## 2012-12-31 MED ORDER — MIDAZOLAM HCL 2 MG/2ML IJ SOLN
1.0000 mg | INTRAMUSCULAR | Status: DC | PRN
  Administered 2012-12-31: 2 mg via INTRAVENOUS

## 2012-12-31 MED ORDER — CEFAZOLIN SODIUM-DEXTROSE 2-3 GM-% IV SOLR
2.0000 g | INTRAVENOUS | Status: AC
  Administered 2012-12-31: 2 g via INTRAVENOUS

## 2012-12-31 MED ORDER — FENTANYL CITRATE 0.05 MG/ML IJ SOLN
50.0000 ug | Freq: Once | INTRAMUSCULAR | Status: AC
  Administered 2012-12-31: 100 ug via INTRAVENOUS

## 2012-12-31 MED ORDER — SODIUM CHLORIDE 0.9 % IJ SOLN: 3.0000 mL | INTRAMUSCULAR | Status: DC | PRN

## 2012-12-31 MED ORDER — PROPOFOL 10 MG/ML IV BOLUS
INTRAVENOUS | Status: DC | PRN
  Administered 2012-12-31: 200 mg via INTRAVENOUS

## 2012-12-31 MED ORDER — ONDANSETRON HCL 4 MG/2ML IJ SOLN
INTRAMUSCULAR | Status: DC | PRN
  Administered 2012-12-31: 4 mg via INTRAVENOUS

## 2012-12-31 MED ORDER — FENTANYL CITRATE 0.05 MG/ML IJ SOLN
INTRAMUSCULAR | Status: DC | PRN
  Administered 2012-12-31 (×2): 50 ug via INTRAVENOUS

## 2012-12-31 MED ORDER — LACTATED RINGERS IV SOLN: INTRAVENOUS | Status: DC

## 2012-12-31 MED ORDER — PHENYLEPHRINE HCL 10 MG/ML IJ SOLN
INTRAMUSCULAR | Status: DC | PRN
  Administered 2012-12-31 (×2): 80 ug via INTRAVENOUS

## 2012-12-31 MED ORDER — OXYCODONE HCL 5 MG/5ML PO SOLN: 5.0000 mg | Freq: Once | ORAL | Status: AC | PRN

## 2012-12-31 MED ORDER — DEXAMETHASONE SODIUM PHOSPHATE 4 MG/ML IJ SOLN
INTRAMUSCULAR | Status: DC | PRN
  Administered 2012-12-31: 10 mg via INTRAVENOUS

## 2012-12-31 MED ORDER — LIDOCAINE HCL (CARDIAC) 20 MG/ML IV SOLN
INTRAVENOUS | Status: DC | PRN
  Administered 2012-12-31: 80 mg via INTRAVENOUS

## 2012-12-31 MED ORDER — HYDROCODONE-ACETAMINOPHEN 5-325 MG PO TABS: 1.0000 | ORAL_TABLET | ORAL | Status: DC | PRN

## 2012-12-31 MED ORDER — TECHNETIUM TC 99M SULFUR COLLOID FILTERED
1.0000 | Freq: Once | INTRAVENOUS | Status: AC | PRN
  Administered 2012-12-31: 1 via INTRADERMAL

## 2012-12-31 MED ORDER — ONDANSETRON HCL 4 MG/2ML IJ SOLN: 4.0000 mg | Freq: Once | INTRAMUSCULAR | Status: DC | PRN

## 2012-12-31 SURGICAL SUPPLY — 60 items
APPLIER CLIP 11 MED OPEN (CLIP)
BINDER BREAST MEDIUM (GAUZE/BANDAGES/DRESSINGS) ×2 IMPLANT
BLADE SURG 10 STRL SS (BLADE) ×2 IMPLANT
BLADE SURG 15 STRL LF DISP TIS (BLADE) ×1 IMPLANT
BLADE SURG 15 STRL SS (BLADE) ×1
CANISTER SUCTION 1200CC (MISCELLANEOUS) ×2 IMPLANT
CHLORAPREP W/TINT 26ML (MISCELLANEOUS) ×2 IMPLANT
CLIP APPLIE 11 MED OPEN (CLIP) IMPLANT
CLIP TI WIDE RED SMALL 6 (CLIP) ×4 IMPLANT
CLOTH BEACON ORANGE TIMEOUT ST (SAFETY) ×2 IMPLANT
COVER MAYO STAND STRL (DRAPES) ×2 IMPLANT
COVER PROBE W GEL 5X96 (DRAPES) ×2 IMPLANT
COVER TABLE BACK 60X90 (DRAPES) ×2 IMPLANT
DECANTER SPIKE VIAL GLASS SM (MISCELLANEOUS) IMPLANT
DERMABOND ADVANCED (GAUZE/BANDAGES/DRESSINGS) ×2
DERMABOND ADVANCED .7 DNX12 (GAUZE/BANDAGES/DRESSINGS) ×2 IMPLANT
DEVICE DUBIN W/COMP PLATE 8390 (MISCELLANEOUS) ×2 IMPLANT
DRAIN CHANNEL 19F RND (DRAIN) IMPLANT
DRAIN HEMOVAC 1/8 X 5 (WOUND CARE) IMPLANT
DRAPE LAPAROSCOPIC ABDOMINAL (DRAPES) ×2 IMPLANT
DRAPE UTILITY XL STRL (DRAPES) ×2 IMPLANT
ELECT COATED BLADE 2.86 ST (ELECTRODE) ×2 IMPLANT
ELECT REM PT RETURN 9FT ADLT (ELECTROSURGICAL) ×2
ELECTRODE REM PT RTRN 9FT ADLT (ELECTROSURGICAL) ×1 IMPLANT
EVACUATOR SILICONE 100CC (DRAIN) IMPLANT
GLOVE BIOGEL M 7.0 STRL (GLOVE) ×4 IMPLANT
GLOVE BIOGEL PI IND STRL 7.5 (GLOVE) ×1 IMPLANT
GLOVE BIOGEL PI IND STRL 8 (GLOVE) ×1 IMPLANT
GLOVE BIOGEL PI INDICATOR 7.5 (GLOVE) ×1
GLOVE BIOGEL PI INDICATOR 8 (GLOVE) ×1
GLOVE SS BIOGEL STRL SZ 7.5 (GLOVE) ×2 IMPLANT
GLOVE SUPERSENSE BIOGEL SZ 7.5 (GLOVE) ×2
GOWN PREVENTION PLUS XLARGE (GOWN DISPOSABLE) ×2 IMPLANT
GOWN PREVENTION PLUS XXLARGE (GOWN DISPOSABLE) ×2 IMPLANT
KIT MARKER MARGIN INK (KITS) ×2 IMPLANT
NDL SAFETY ECLIPSE 18X1.5 (NEEDLE) ×1 IMPLANT
NEEDLE HYPO 18GX1.5 SHARP (NEEDLE) ×1
NEEDLE HYPO 25X1 1.5 SAFETY (NEEDLE) ×4 IMPLANT
NS IRRIG 1000ML POUR BTL (IV SOLUTION) ×2 IMPLANT
PACK BASIN DAY SURGERY FS (CUSTOM PROCEDURE TRAY) ×2 IMPLANT
PAD ALCOHOL SWAB (MISCELLANEOUS) ×2 IMPLANT
PENCIL BUTTON HOLSTER BLD 10FT (ELECTRODE) ×2 IMPLANT
PIN SAFETY STERILE (MISCELLANEOUS) IMPLANT
SLEEVE SCD COMPRESS KNEE MED (MISCELLANEOUS) ×2 IMPLANT
SLEEVE SURGEON STRL (DRAPES) ×2 IMPLANT
SPONGE LAP 18X18 X RAY DECT (DISPOSABLE) IMPLANT
SPONGE LAP 4X18 X RAY DECT (DISPOSABLE) ×2 IMPLANT
SUT ETHILON 3 0 FSL (SUTURE) IMPLANT
SUT MNCRL AB 4-0 PS2 18 (SUTURE) ×2 IMPLANT
SUT MON AB 4-0 PC3 18 (SUTURE) IMPLANT
SUT MON AB 5-0 PS2 18 (SUTURE) ×2 IMPLANT
SUT SILK 3 0 SH 30 (SUTURE) IMPLANT
SUT VIC AB 3-0 SH 27 (SUTURE)
SUT VIC AB 3-0 SH 27X BRD (SUTURE) IMPLANT
SUT VICRYL 3-0 CR8 SH (SUTURE) ×2 IMPLANT
SYR CONTROL 10ML LL (SYRINGE) ×4 IMPLANT
TOWEL OR 17X24 6PK STRL BLUE (TOWEL DISPOSABLE) ×2 IMPLANT
TOWEL OR NON WOVEN STRL DISP B (DISPOSABLE) ×2 IMPLANT
TUBE CONNECTING 20X1/4 (TUBING) ×2 IMPLANT
YANKAUER SUCT BULB TIP NO VENT (SUCTIONS) ×2 IMPLANT

## 2012-12-31 NOTE — Discharge Instructions (Signed)
[facility] °Office Phone Number [phone] ° °BREAST BIOPSY/ PARTIAL MASTECTOMY: POST OP INSTRUCTIONS ° °Always review your discharge instruction sheet given to you by the facility where your surgery was performed. ° °IF YOU HAVE DISABILITY OR FAMILY LEAVE FORMS, YOU MUST BRING THEM TO THE OFFICE FOR PROCESSING.  DO NOT GIVE THEM TO YOUR DOCTOR. ° °1. A prescription for pain medication may be given to you upon discharge.  Take your pain medication as prescribed, if needed.  If narcotic pain medicine is not needed, then you may take acetaminophen (Tylenol) or ibuprofen (Advil) as needed. °2. Take your usually prescribed medications unless otherwise directed °3. If you need a refill on your pain medication, please contact your pharmacy.  They will contact our office to request authorization.  Prescriptions will not be filled after 5pm or on week-ends. °4. You should eat very light the first 24 hours after surgery, such as soup, crackers, pudding, etc.  Resume your normal diet the day after surgery. °5. Most patients will experience some swelling and bruising in the breast.  Ice packs and a good support bra will help.  Swelling and bruising can take several days to resolve.  °6. It is common to experience some constipation if taking pain medication after surgery.  Increasing fluid intake and taking a stool softener will usually help or prevent this problem from occurring.  A mild laxative (Milk of Magnesia or Miralax) should be taken according to package directions if there are no bowel movements after 48 hours. °7. Unless discharge instructions indicate otherwise, you may remove your bandages 24-48 hours after surgery, and you may shower at that time.  You may have steri-strips (small skin tapes) in place directly over the incision.  These strips should be left on the skin for 7-10 days.  If your surgeon used skin glue on the incision, you may shower in 24 hours.  The glue will flake off over the  next 2-3 weeks.  Any sutures or staples will be removed at the office during your follow-up visit. °8. ACTIVITIES:  You may resume regular daily activities (gradually increasing) beginning the next day.  Wearing a good support bra or sports bra minimizes pain and swelling.  You may have sexual intercourse when it is comfortable. °a. You may drive when you no longer are taking prescription pain medication, you can comfortably wear a seatbelt, and you can safely maneuver your car and apply brakes. °b. RETURN TO WORK:  ______________________________________________________________________________________ °9. You should see your doctor in the office for a follow-up appointment approximately two weeks after your surgery.  Your doctor’s nurse will typically make your follow-up appointment when she calls you with your pathology report.  Expect your pathology report 2-3 business days after your surgery.  You may call to check if you do not hear from us after three days. °10. OTHER INSTRUCTIONS: _______________________________________________________________________________________________ _____________________________________________________________________________________________________________________________________ °_____________________________________________________________________________________________________________________________________ °_____________________________________________________________________________________________________________________________________ ° °WHEN TO CALL YOUR DOCTOR: °1. Fever over 101.0 °2. Nausea and/or vomiting. °3. Extreme swelling or bruising. °4. Continued bleeding from incision. °5. Increased pain, redness, or drainage from the incision. ° °The clinic staff is available to answer your questions during regular business hours.  Please don’t hesitate to call and ask to speak to one of the nurses for clinical concerns.  If you have a medical emergency, go to the nearest  emergency room or call 911.  A surgeon from [facility] is always on call at the hospital. ° °For further questions, please visit [email or web address]  ° ° °  Post Anesthesia Home Care Instructions ° °Activity: °Get plenty of rest for the remainder of the day. A responsible adult should stay with you for 24 hours following the procedure.  °For the next 24 hours, DO NOT: °-Drive a car °-Operate machinery °-Drink alcoholic beverages °-Take any medication unless instructed by your physician °-Make any legal decisions or sign important papers. ° °Meals: °Start with liquid foods such as gelatin or soup. Progress to regular foods as tolerated. Avoid greasy, spicy, heavy foods. If nausea and/or vomiting occur, drink only clear liquids until the nausea and/or vomiting subsides. Call your physician if vomiting continues. ° °Special Instructions/Symptoms: °Your throat may feel dry or sore from the anesthesia or the breathing tube placed in your throat during surgery. If this causes discomfort, gargle with warm salt water. The discomfort should disappear within 24 hours. ° °

## 2012-12-31 NOTE — Progress Notes (Signed)
Radiology staff here to do nuc med injection. Versed and fentanyl given prior to injection. VSS (see doc flowsheet) and pt tol well. Family called to bedside... Very supportive

## 2012-12-31 NOTE — Anesthesia Procedure Notes (Signed)
Procedure Name: LMA Insertion Date/Time: 12/31/2012 12:17 PM Performed by: Burna Cash Pre-anesthesia Checklist: Patient identified, Emergency Drugs available, Suction available and Patient being monitored Patient Re-evaluated:Patient Re-evaluated prior to inductionOxygen Delivery Method: Circle System Utilized Preoxygenation: Pre-oxygenation with 100% oxygen Intubation Type: IV induction Ventilation: Mask ventilation without difficulty LMA: LMA inserted LMA Size: 4.0 Number of attempts: 1 Airway Equipment and Method: bite block Placement Confirmation: positive ETCO2 Tube secured with: Tape Dental Injury: Teeth and Oropharynx as per pre-operative assessment

## 2012-12-31 NOTE — Anesthesia Preprocedure Evaluation (Signed)
Anesthesia Evaluation  Patient identified by MRN, date of birth, ID band Patient awake    Reviewed: Allergy & Precautions, H&P , NPO status , Patient's Chart, lab work & pertinent test results  Airway Mallampati: I TM Distance: >3 FB Neck ROM: Full    Dental  (+) Upper Dentures and Dental Advisory Given   Pulmonary asthma ,  breath sounds clear to auscultation        Cardiovascular Rhythm:Regular Rate:Normal     Neuro/Psych    GI/Hepatic   Endo/Other    Renal/GU      Musculoskeletal   Abdominal   Peds  Hematology   Anesthesia Other Findings   Reproductive/Obstetrics                           Anesthesia Physical Anesthesia Plan  ASA: II  Anesthesia Plan: General   Post-op Pain Management:    Induction: Intravenous  Airway Management Planned: LMA  Additional Equipment:   Intra-op Plan:   Post-operative Plan: Extubation in OR  Informed Consent: I have reviewed the patients History and Physical, chart, labs and discussed the procedure including the risks, benefits and alternatives for the proposed anesthesia with the patient or authorized representative who has indicated his/her understanding and acceptance.   Dental advisory given  Plan Discussed with: CRNA, Anesthesiologist and Surgeon  Anesthesia Plan Comments:         Anesthesia Quick Evaluation

## 2012-12-31 NOTE — Anesthesia Postprocedure Evaluation (Signed)
  Anesthesia Post-op Note  Patient: Melissa [name]  Procedure(s) Performed: Procedure(s): NEEDLE LOCALIZATION LEFT BREAST LUMPECTOMY AND LEFT AXILLARY SENTENIAL LYMPH NODE BX (Left)  Patient Location: PACU  Anesthesia Type:General  Level of Consciousness: awake, alert  and oriented  Airway and Oxygen Therapy: Patient Spontanous Breathing  Post-op Pain: mild  Post-op Assessment: Post-op Vital signs reviewed  Post-op Vital Signs: Reviewed  Complications: No apparent anesthesia complications

## 2012-12-31 NOTE — Transfer of Care (Signed)
Immediate Anesthesia Transfer of Care Note  Patient: Melissa [name]  Procedure(s) Performed: Procedure(s): NEEDLE LOCALIZATION LEFT BREAST LUMPECTOMY AND LEFT AXILLARY SENTENIAL LYMPH NODE BX (Left)  Patient Location: PACU  Anesthesia Type:General  Level of Consciousness: sedated  Airway & Oxygen Therapy: Patient Spontanous Breathing and Patient connected to face mask oxygen  Post-op Assessment: Report given to PACU RN and Post -op Vital signs reviewed and stable  Post vital signs: Reviewed and stable  Complications: No apparent anesthesia complications

## 2012-12-31 NOTE — Op Note (Signed)
Preoperative Diagnosis: cancer left breast  Postoprative Diagnosis: cancer left breast  Procedure: Procedure(s): BLUE DYE INJECTION,NEEDLE LOCALIZATION LEFT BREAST LUMPECTOMY AND LEFT AXILLARY SENTENIAL LYMPH NODE BX   Surgeon: Glenna Fellows T   Assistants: None  Anesthesia:  General LMA anesthesia  Indications:   Patient is a 47 year old female who presented approximately 6 months ago with a new left breast cancer in the upper left breast, approximately 3 cm. She has undergone neoadjuvant chemotherapy with significant reduction in the size of the tumor to approximately 1.1 cm on recent imaging. We are now proceeding with definitive surgical treatment. After extensive discussion regarding options and risks detailed elsewhere we have elected to proceed with needle localized left breast lumpectomy and left axillary sentinel lymph node biopsy.  Procedure Detail:  Following accurate needle localization with bracketing wires and following injection of 1 mCi of technetium sulfur colloid intradermally around the left nipple prior to surgery the patient is brought to the operating room, placed in the supine position on the operating table and laryngeal mask general anesthesia induced. The left breast was sterilely prepped. Patient timeout was performed and correct procedure verified. I then injected 5 cc of dilute methylene blue subcutaneously beneath the left nipple and massaged this for several minutes. The entire breast and chest and axilla and upper arm were then widely sterilely prepped and draped. The patient timeout was again performed. The lumpectomy was approached initially. I made a curvilinear incision in the upper breast directly over the projected site of the tumor. Dissection was carried down through the subcutaneous tissue. I then dissected superiorly and brought both localization wires into the incision. Using cautery I then excised a generous specimen of breast tissue around the  shafts and tips of both wires. This was done in an effort to obtain wide margins. The excision was taken down to the chest wall and the fascia was taken as the posterior margin. The specimen was excised. It was oriented with ink. Specimen mammography showed the clip and abnormal calcifications well localized in the central portion of the specimen. It was sent for permanent pathology. This wound was irrigated and hemostasis obtained. Soft tissue was infiltrated with Marcaine. The deep tissue was closed with interrupted 3-0 Vicryl after marking the cavity with clips. Attention was turned to the axilla. A hot area in the left axilla was localized with the neoprobe and a small transverse incision made. Dissection was carried down through the subcutaneous tissue with cautery. The clavipectoral fascia was incised. Using the neoprobe for guidance directed down onto a bright blue lymph node with high counts. This was completely excised with cautery and ex vivo it had counts of 2800. Background in the axilla was no greater than 50. This was sent as left axillary hot blue sentinel lymph node. Hemostasis was obtained with cautery and the soft tissue infiltrated with Marcaine and the deep tissue closed with interrupted Vicryls. Both incisions were closed with subcuticular 4-0 Monocryl and Dermabond. Sponge needle and instrument counts were correct.   Estimated Blood Loss:  Minimal         Drains: none  Blood Given: none          Specimens: #1 left breast tissue #2 left axillary sentinel lymph node        Complications:  * No complications entered in OR log *         Disposition: PACU - hemodynamically stable.         Condition: stable

## 2012-12-31 NOTE — H&P (Signed)
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  PRIOR THERAPY:  #1 patient was originally seen in the multidisciplinary breast clinic when she found a left breast mass she eventually had MRI and mammograms performed. She was diagnosed with invasive ductal carcinoma that was ER positive PR positive HER-2/neu positive measuring 3.1 cm by MRI criteria. Ki-67 was 70% HER-2 was amplified with a ratio 2.91.  #2 patient was seen at the multidisciplinary breast clinic and a recommendation for neoadjuvant chemotherapy was recommended as patient was interested in breast conservation. She will receive neoadjuvant Taxotere carboplatinum Herceptin. Taxotere carboplatinum will be given every 21 days with weekly Herceptin. Total of 6 cycles of Taxotere and carboplatinum will be administered. This was started 07/12/12.  She just completed her final dose of Taxotere and carboplatin. She had a followup MRI performed which has shown significant reduction in her left breast mass from initially 3.1 cm to currently 1.1 cm. She states her palpable abnormality has resolved. MRI showed some questionably enlarged lymph nodes in the axilla but not clearly abnormal. She is tolerating her treatment with just expected side effects.   Past Medical History   Diagnosis  Date   .  Breast cancer    .  Headache    .  Back pain    .  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  [place] fever     Past Surgical History   Procedure  Laterality  Date   .  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  Cesarean section   1995/1996   .  Lobectomy   1997     upper left   .  Portacath placement    07/02/2012     Procedure: INSERTION PORT-A-CATH; Surgeon: Mariella Saa, MD; Location: [facility]; Service: General; Laterality: N/A; right    Current Outpatient Prescriptions   Medication  Sig  Dispense  Refill   .  albuterol (VENTOLIN HFA) 108 (90 BASE) MCG/ACT inhaler  Inhale 2 puffs into the lungs every 6 (six) hours as needed.     Marland Kitchen  amoxicillin (AMOXIL) 500 MG capsule  Take 1 capsule (500 mg total) by mouth 3 (three) times daily.  30 capsule  11   .  dexamethasone (DECADRON) 4 MG tablet  Take 2 tablets (8 mg total) by mouth 2 (two) times daily with a meal. Take two times a day the day before Taxotere. Then take two times a day starting the day after chemo for 3 days.  30 tablet  4   .  DPH-Lido-AlHydr-MgHydr-Simeth (FIRST-MOUTHWASH BLM) SUSP      .  fluticasone (FLONASE) 50 MCG/ACT nasal spray  Place 2 sprays into the nose daily.  16 g  2   .  lidocaine-prilocaine (EMLA) cream  Apply topically as needed.  30 g  7   .  LORazepam (ATIVAN) 0.5 MG tablet  TAKE 1 TABLET BY MOUTH EVERY 6 HOURS AS NEEDED FOR NAUSEA/VOMITING  30 tablet  0   .  naproxen sodium (ANAPROX) 220 MG tablet  Take 220 mg by mouth 2 (two) times daily with a meal.     .  omeprazole (PRILOSEC) 40 MG capsule  TAKE 1 CAPSULE BY MOUTH DAILY  30 capsule  2   .  ondansetron (ZOFRAN) 8 MG tablet  Take 1 tablet (8 mg total) by mouth 2 (two) times daily. Take two times a day starting the day after chemo for 3 days. Then take two times a day as needed for nausea or vomiting.  30 tablet  1   .  prochlorperazine (COMPAZINE) 10 MG tablet  Take 1 tablet (10 mg total) by mouth every 6 (six) hours as needed (Nausea or vomiting).  30 tablet  1   .  valACYclovir (VALTREX) 500 MG tablet       No current facility-administered medications for this visit.    Allergies   Allergen  Reactions   .  Aspirin      Unknown reaction   .  Iodinated Diagnostic Agents  Hives     Pt states in [place] got hives and  itching with IV contrast. Per pt allergy was to prev CT.    History   Substance Use Topics   .  Smoking status:  Current Every Day Smoker -- 0.50 packs/day for 24 years     Types:  Cigarettes   .  Smokeless tobacco:  Never Used   .  Alcohol Use:  No   Exam:  BP 103/64  Pulse 77  Temp(Src) 97.8 F (36.6 C) (Oral)  Resp 20  Ht 5\' 2"  (1.575 m)  Wt 119 lb (53.978 kg)  BMI 21.76 kg/m2  SpO2 100%  LMP 06/25/2012 General: Well-developed Caucasian female in no distress  Skin: No rash or infection  HEENT: No palpable masses. Alopecia.  Lungs: Mild end expiratory wheezing  Lymph nodes: No cervical, supraclavicular or axillary nodes palpable  Breasts: I cannot feel any distinct mass currently in her left breast. No appreciable abnormalities in either breast.  Cardiac: Regular rate and rhythm without murmurs  Abdomen soft nontender without masses   Assessment plan: Cancer left breast with excellent response to neoadjuvant chemotherapy with shrinkage of her primary tumor from 3.1-1.1 cm. We are now proceeding with definitive surgical treatment. She prefers breast conservation. We discussed needle localized lumpectomy and axillary sentinel lymph node biopsy on the left. We discussed the nature and rationale for the procedures and recovery as well as risks of anesthetic complications, bleeding, infection, possible need for further surgery based on final pathology findings and small risk of lymphedema. All her questions were answered.  Mariella Saa MD, FACS  12/31/2012, 12:00 PM

## 2013-01-01 ENCOUNTER — Encounter (HOSPITAL_BASED_OUTPATIENT_CLINIC_OR_DEPARTMENT_OTHER): Payer: Self-pay | Admitting: General Surgery

## 2013-01-03 ENCOUNTER — Telehealth (INDEPENDENT_AMBULATORY_CARE_PROVIDER_SITE_OTHER): Payer: Self-pay | Admitting: General Surgery

## 2013-01-03 ENCOUNTER — Telehealth (INDEPENDENT_AMBULATORY_CARE_PROVIDER_SITE_OTHER): Payer: Self-pay | Admitting: *Deleted

## 2013-01-03 NOTE — Telephone Encounter (Signed)
Called pt and discussed path

## 2013-01-03 NOTE — Telephone Encounter (Signed)
Patient called to request her surg path results.

## 2013-01-04 NOTE — Telephone Encounter (Signed)
Message was given to Dr. Johna Sheriff yesterday evening.  He said that he was calling patient to discuss pathology results.

## 2013-01-07 ENCOUNTER — Telehealth: Payer: Self-pay | Admitting: Oncology

## 2013-01-09 ENCOUNTER — Telehealth: Payer: Self-pay | Admitting: *Deleted

## 2013-01-09 ENCOUNTER — Encounter: Payer: Self-pay | Admitting: Adult Health

## 2013-01-09 ENCOUNTER — Telehealth: Payer: Self-pay | Admitting: Oncology

## 2013-01-09 ENCOUNTER — Ambulatory Visit (HOSPITAL_BASED_OUTPATIENT_CLINIC_OR_DEPARTMENT_OTHER): Payer: 59 | Admitting: Adult Health

## 2013-01-09 ENCOUNTER — Other Ambulatory Visit (HOSPITAL_BASED_OUTPATIENT_CLINIC_OR_DEPARTMENT_OTHER): Payer: BC Managed Care – PPO | Admitting: Lab

## 2013-01-09 ENCOUNTER — Ambulatory Visit (HOSPITAL_BASED_OUTPATIENT_CLINIC_OR_DEPARTMENT_OTHER): Payer: BC Managed Care – PPO

## 2013-01-09 VITALS — BP 108/70 | HR 76 | Temp 98.0°F | Resp 20 | Ht 62.0 in | Wt 120.4 lb

## 2013-01-09 DIAGNOSIS — C50219 Malignant neoplasm of upper-inner quadrant of unspecified female breast: Secondary | ICD-10-CM

## 2013-01-09 DIAGNOSIS — C50919 Malignant neoplasm of unspecified site of unspecified female breast: Secondary | ICD-10-CM

## 2013-01-09 DIAGNOSIS — Z170 Estrogen receptor positive status [ER+]: Secondary | ICD-10-CM

## 2013-01-09 DIAGNOSIS — C50212 Malignant neoplasm of upper-inner quadrant of left female breast: Secondary | ICD-10-CM

## 2013-01-09 DIAGNOSIS — Z5112 Encounter for antineoplastic immunotherapy: Secondary | ICD-10-CM

## 2013-01-09 LAB — CBC WITH DIFFERENTIAL/PLATELET
Eosinophils Absolute: 1.2 10*3/uL — ABNORMAL HIGH (ref 0.0–0.5)
MONO#: 0.5 10*3/uL (ref 0.1–0.9)
NEUT#: 3.6 10*3/uL (ref 1.5–6.5)
RBC: 3.57 10*6/uL — ABNORMAL LOW (ref 3.70–5.45)
RDW: 20.3 % — ABNORMAL HIGH (ref 11.2–14.5)
WBC: 7.5 10*3/uL (ref 3.9–10.3)
lymph#: 2.2 10*3/uL (ref 0.9–3.3)

## 2013-01-09 LAB — COMPREHENSIVE METABOLIC PANEL (CC13)
Albumin: 3.7 g/dL (ref 3.5–5.0)
CO2: 27 mEq/L (ref 22–29)
Glucose: 89 mg/dl (ref 70–140)
Potassium: 3.6 mEq/L (ref 3.5–5.1)
Sodium: 139 mEq/L (ref 136–145)
Total Protein: 7.6 g/dL (ref 6.4–8.3)

## 2013-01-09 MED ORDER — TRASTUZUMAB CHEMO INJECTION 440 MG
6.0000 mg/kg | Freq: Once | INTRAVENOUS | Status: AC
  Administered 2013-01-09: 336 mg via INTRAVENOUS
  Filled 2013-01-09: qty 16

## 2013-01-09 MED ORDER — ACETAMINOPHEN 325 MG PO TABS
650.0000 mg | ORAL_TABLET | Freq: Once | ORAL | Status: AC
  Administered 2013-01-09: 650 mg via ORAL

## 2013-01-09 MED ORDER — SODIUM CHLORIDE 0.9 % IJ SOLN
10.0000 mL | INTRAMUSCULAR | Status: DC | PRN
  Administered 2013-01-09: 10 mL
  Filled 2013-01-09: qty 10

## 2013-01-09 MED ORDER — SODIUM CHLORIDE 0.9 % IV SOLN
Freq: Once | INTRAVENOUS | Status: AC
  Administered 2013-01-09: 16:00:00 via INTRAVENOUS

## 2013-01-09 MED ORDER — HEPARIN SOD (PORK) LOCK FLUSH 100 UNIT/ML IV SOLN
500.0000 [IU] | Freq: Once | INTRAVENOUS | Status: AC | PRN
  Administered 2013-01-09: 500 [IU]
  Filled 2013-01-09: qty 5

## 2013-01-09 MED ORDER — DIPHENHYDRAMINE HCL 25 MG PO CAPS
50.0000 mg | ORAL_CAPSULE | Freq: Once | ORAL | Status: AC
  Administered 2013-01-09: 25 mg via ORAL

## 2013-01-09 NOTE — Telephone Encounter (Signed)
, °

## 2013-01-09 NOTE — Patient Instructions (Addendum)
Doing well.  Proceed with Herceptin.  Please call us if you have any questions or concerns.    

## 2013-01-09 NOTE — Progress Notes (Signed)
OFFICE PROGRESS NOTE  CC  PCP: [name], MD   [name], MD [address] [place] [address]  DIAGNOSIS: 47 year old female with premenopausal ER positive PR positive HER-2/neu positive invasive ductal carcinoma of the left breast clinical stage IIA diagnosed in November 2013.  PRIOR THERAPY:  #1 patient was originally seen in the multidisciplinary breast clinic when she found a left breast mass she eventually had MRI and mammograms performed. She was diagnosed with invasive ductal carcinoma that was ER positive PR positive HER-2/neu positive measuring 3.1 cm by MRI criteria. Ki-67 was 70% HER-2 was amplified with a ratio 2.91.  #2 patient was seen at the multidisciplinary breast clinic and a recommendation for neoadjuvant chemotherapy was recommended as patient was interested in breast conservation. She will receive neoadjuvant Taxotere carboplatinum Herceptin. Taxotere carboplatinum will be given every 21 days with weekly Herceptin. Total of 6 cycles of Taxotere and carboplatinum will be administered.  This was started 07/12/12.  3 patient had MRI of the breasts performed on 10/15/2012 which does show reduction in the size of the left breast mass from 3.1to 1.2. This is a wonderful response.  4. Patient begun on every 3 week herceptin beginning 11/29/12.  5. Patient underwent lumpectomy on 12/31/12 a 1.8 cm tumor was removed, the margins were 0.1cm and 1/1 sentinel nodes were positive disease.  A re-excision and axillary node dissection was recommended.   CURRENT THERAPY:   Herceptin every 3 weeks  INTERVAL HISTORY: Patient returns in followup visit prior to every 3 week Herceptin.  She is feeling well today.  She underwent lumpectomy with sentinel node biopsy on 12/31/12, the margins were 0.1cm and 1/1 sentinel nodes were positive.  She's feeling well after surgery.  Just concerned about her pathology results.  She denies fevers, chills, DOE, PND, orthopnea, or any  other concerns.  A 10 point ROS is negative.    Past Medical History  Diagnosis Date  . Breast cancer   . Headache(784.0)   . Back pain   . 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 [place] fever     ALLERGIES:  is allergic to aspirin and iodinated diagnostic agents.  MEDICATIONS:  Current Outpatient Prescriptions  Medication Sig Dispense Refill  . albuterol (VENTOLIN HFA) 108 (90 BASE) MCG/ACT inhaler Inhale 2 puffs into the lungs every 6 (six) hours as needed.  1 Inhaler  3  . cetirizine (ZYRTEC) 10 MG tablet Take 10 mg by mouth daily.      . chlorpheniramine-HYDROcodone (TUSSIONEX) 10-8 MG/5ML LQCR Take 5 mLs by mouth at bedtime as needed.  140 mL  0  . fluticasone (FLONASE) 50 MCG/ACT nasal spray Place 2 sprays into the nose daily.  16 g  2  . HYDROcodone-acetaminophen (NORCO/VICODIN) 5-325 MG per tablet Take 1-2 tablets by mouth every 4 (four) hours as needed for pain.  30 tablet  1  . lidocaine-prilocaine (EMLA) cream Apply topically as needed.  30 g  7  . naproxen sodium (ANAPROX) 220 MG tablet Take 220 mg by mouth 2 (two) times daily with a meal.      . omeprazole (PRILOSEC) 40 MG capsule TAKE 1 CAPSULE BY MOUTH DAILY  30 capsule  2  . valACYclovir (VALTREX) 500 MG tablet        No current facility-administered medications for this visit.    SURGICAL HISTORY:  Past Surgical History  Procedure Laterality Date  . Lung  lobectomy  11/97    lul > after developed [place] Fever  . Cesarean section  1995/1996  . Lobectomy  1997    upper left  . Portacath placement  07/02/2012    Procedure: INSERTION PORT-A-CATH;  Surgeon: Mariella Saa, MD;  Location: [facility];  Service: General;  Laterality: N/A;  right  . Breast surgery  2006    cyst rt br-neg  . Breast lumpectomy with needle localization and axillary sentinel lymph node bx Left 12/31/2012    Procedure: NEEDLE LOCALIZATION LEFT BREAST LUMPECTOMY AND  LEFT AXILLARY SENTENIAL LYMPH NODE BX;  Surgeon: Mariella Saa, MD;  Location:  SURGERY CENTER;  Service: General;  Laterality: Left;    REVIEW OF SYSTEMS:   General: fatigue (-), night sweats (-), fever (-), pain (-) Lymph: palpable nodes (-) HEENT: vision changes (-), mucositis (-), gum bleeding (-), epistaxis (-) Cardiovascular: chest pain (-), palpitations (-) Pulmonary: shortness of breath (-), dyspnea on exertion (-), cough (-), hemoptysis (-) GI:  Early satiety (-), melena (-), dysphagia (-), nausea/vomiting (-), diarrhea (-) GU: dysuria (-), hematuria (-), incontinence (-) Musculoskeletal: joint swelling (-), joint pain (-), back pain (-) Neuro: weakness (-), numbness (-), headache (-), confusion (-) Skin: Rash (-), lesions (-), dryness (-) Psych: depression (-), suicidal/homicidal ideation (-), feeling of hopelessness (-)   PHYSICAL EXAMINATION: Blood pressure 108/70, pulse 76, temperature 98 F (36.7 C), temperature source Oral, resp. rate 20, height 5\' 2"  (1.575 m), weight 120 lb 6.4 oz (54.613 kg). Body mass index is 22.02 kg/(m^2). General: Patient is a well appearing female in no acute distress HEENT: PERRLA, sclerae anicteric no conjunctival pallor, MMM, ulceration in bilateral buccal mucosa, blistering in corners of mouth.   Neck: supple, no palpable adenopathy Lungs: mild wheezing in the left lung.  Right lung clear to auscultation Cardiovascular: regular rate rhythm, S1, S2, no murmurs, rubs or gallops Abdomen: Soft, non-tender, non-distended, normoactive bowel sounds, no HSM Extremities: warm and well perfused, no clubbing, cyanosis, or edema Skin: No rashes or lesions Neuro: Non-focal Breasts: The right breast is unremarkable. Left breast lumpectomy site intact healing well with surgical glue in place, left axilla surgical site healing well, no erythema or exudate.   ECOG PERFORMANCE STATUS: 1  LABORATORY DATA: Lab Results  Component Value Date    WBC 7.5 01/09/2013   HGB 12.5 01/09/2013   HCT 36.6 01/09/2013   MCV 102.5* 01/09/2013   PLT 281 01/09/2013      Chemistry      Component Value Date/Time   NA 139 01/09/2013 1349   NA 141 01/26/2011 1039   K 3.6 01/09/2013 1349   K 4.5 01/26/2011 1039   CL 100 12/20/2012 1509   CL 106 01/26/2011 1039   CO2 27 01/09/2013 1349   CO2 28 01/26/2011 1039   BUN 7.2 01/09/2013 1349   BUN 7 01/26/2011 1039   CREATININE 0.8 01/09/2013 1349   CREATININE 0.6 01/26/2011 1039      Component Value Date/Time   CALCIUM 9.9 01/09/2013 1349   CALCIUM 9.2 01/26/2011 1039   ALKPHOS 70 01/09/2013 1349   AST 19 01/09/2013 1349   ALT 17 01/09/2013 1349   BILITOT 0.25 01/09/2013 1349       RADIOGRAPHIC STUDIES: Dg Chest 2 View  07/30/2012  *RADIOLOGY REPORT*  Clinical Data: Bronchiectasis.  History of breast cancer.  CHEST - 2 VIEW  Comparison: 07/02/2012 and 05/03/2011  Findings: Power port in place.  There is now an  ill-defined 16 mm in density in the left lung base which correlates with the previous cystic lesion seen on CT scan of 01/26/2011.  There is central lucency.  I suspect this represents retraction and wall thickening of the previously seen area of cystic bronchiectasis.  Scarring at the left apex medially due to cystic bronchiectasis appears slightly less prominent.  The right lung is clear.  Heart size is normal.  Previous left upper lobectomy.  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  No other change since the prior exam.   Original Report Authenticated By: Francene Boyers, M.D.      ASSESSMENT: 47 year old [place] woman  with  #1 a clinical stage IIA (3.1 cm) invasive ductal carcinoma of the left breast status post needle core biopsy 06/05/2012 that showed ER positive PR positive HER-2/neu positive breast cancer with a Ki-67 at 70%.   #2 started chemotherapy on 07/13/2011 consisting of carboplatin, docetaxel and trastuzumab every 3 weeks, with the  trastuzumab given weekly  #3 trastuzumab to be continued to complete a year;  most recent Echo 11/19/12  #4 Lumpectomy and Sentinel node biopsy on 12/31/12, re-excision and ALND to be performed and arranged by Dr. Johna Sheriff and his team.   PLAN:   1. Ms. Verma is feeling well today. She will proceed with Herceptin today.  Dr. Welton Flakes and I discussed her pathology results with her and a re-excision and axillary node dissection was requested to be done to Dr. Johna Sheriff.  She will continue on herceptin for one year, and undergo radiation.  She will also go on Tamoxifen after radiation.        2. She will return in 3 weeks for Herceptin.  Re-excision and ALND to be arranged by Dr. Jamse Mead office.    All questions were answered. The patient knows to call the clinic with any problems, questions or concerns. We can certainly see the patient much sooner if necessary.   I spent 25 minutes counseling the patient face to face. The total time spent in the appointment was 30 minutes.  Cherie Ouch Lyn Hollingshead, NP Medical Oncology [facility] Phone: [phone] 01/10/2013, 8:08 AM

## 2013-01-09 NOTE — Patient Instructions (Addendum)
[facility] Discharge Instructions for Patients Receiving Chemotherapy  Today you received the following chemotherapy agents Herceptin.  To help prevent nausea and vomiting after your treatment, we encourage you to take your nausea medication as prescribed.   If you develop nausea and vomiting that is not controlled by your nausea medication, call the clinic.   BELOW ARE SYMPTOMS THAT SHOULD BE REPORTED IMMEDIATELY:  *FEVER GREATER THAN 100.5 F  *CHILLS WITH OR WITHOUT FEVER  NAUSEA AND VOMITING THAT IS NOT CONTROLLED WITH YOUR NAUSEA MEDICATION  *UNUSUAL SHORTNESS OF BREATH  *UNUSUAL BRUISING OR BLEEDING  TENDERNESS IN MOUTH AND THROAT WITH OR WITHOUT PRESENCE OF ULCERS  *URINARY PROBLEMS  *BOWEL PROBLEMS  UNUSUAL RASH Items with * indicate a potential emergency and should be followed up as soon as possible.  Feel free to call the clinic you have any questions or concerns. The clinic phone number is [phone].    

## 2013-01-09 NOTE — Telephone Encounter (Signed)
Per staff phone call and POF I have schedueld appts.  JMW  

## 2013-01-15 ENCOUNTER — Other Ambulatory Visit: Payer: BC Managed Care – PPO | Admitting: Lab

## 2013-01-15 ENCOUNTER — Encounter: Payer: Self-pay | Admitting: Oncology

## 2013-01-15 ENCOUNTER — Ambulatory Visit: Payer: BC Managed Care – PPO

## 2013-01-15 ENCOUNTER — Ambulatory Visit: Payer: BC Managed Care – PPO | Admitting: Adult Health

## 2013-01-15 NOTE — Progress Notes (Signed)
Put disability form on nurse's desk. °

## 2013-01-16 ENCOUNTER — Encounter: Payer: Self-pay | Admitting: Oncology

## 2013-01-16 NOTE — Progress Notes (Signed)
Faxed disability form to [employer] @ [phone].

## 2013-01-20 NOTE — Progress Notes (Signed)
ATTENDING'S ATTESTATION:  I personally reviewed patient's chart, examined patient myself, formulated the treatment plan as followed.    We discussed patient's final pathology after her surgery. She did have residual disease. However she will proceed with radiation therapy and in the meantime she will continue Herceptin every 3 weeks.  She will also need to eventually start on antiestrogen therapy since she is premenopausal it would be tamoxifen. However we certainly could give her Zoladex to put her into menopause and give her an aromatase inhibitor such as Aromasin based on recent studies out of [facility].  Drue Second, MD Medical/Oncology [facility] [phone] (beeper) [phone] (Office)

## 2013-01-25 ENCOUNTER — Other Ambulatory Visit (INDEPENDENT_AMBULATORY_CARE_PROVIDER_SITE_OTHER): Payer: Self-pay | Admitting: General Surgery

## 2013-01-25 ENCOUNTER — Telehealth (INDEPENDENT_AMBULATORY_CARE_PROVIDER_SITE_OTHER): Payer: Self-pay | Admitting: General Surgery

## 2013-01-25 ENCOUNTER — Encounter (INDEPENDENT_AMBULATORY_CARE_PROVIDER_SITE_OTHER): Payer: Self-pay | Admitting: General Surgery

## 2013-01-25 ENCOUNTER — Ambulatory Visit (INDEPENDENT_AMBULATORY_CARE_PROVIDER_SITE_OTHER): Payer: 59 | Admitting: General Surgery

## 2013-01-25 VITALS — BP 126/72 | HR 80 | Temp 98.1°F | Resp 15 | Ht 62.0 in | Wt 119.6 lb

## 2013-01-25 DIAGNOSIS — C50219 Malignant neoplasm of upper-inner quadrant of unspecified female breast: Secondary | ICD-10-CM

## 2013-01-25 DIAGNOSIS — C50212 Malignant neoplasm of upper-inner quadrant of left female breast: Secondary | ICD-10-CM

## 2013-01-25 NOTE — Progress Notes (Signed)
Chief complaint: Followup lumpectomy and sentinel lymph node biopsy  History: Patient returns following lobectomy and axilla sentinel lymph node biopsy for invasive ductal carcinoma left breast, status post neoadjuvant chemotherapy. She reports she is doing well with no problems other than mild soreness.  Exam: The incision is healing well without complication  We reviewed the pathology which showed clear margins on her invasive tumor which measured 1.8 cm. Her sentinel lymph node was positive. She has seen medical oncology and axillary dissection has been recommended. I will discuss this with Dr. Park Breed and get back to the patient. I gave her an appointment to return in one month of flatus.

## 2013-01-25 NOTE — Telephone Encounter (Signed)
Called the patient and discussed proceeding with left axillary lymph node dissection. We discussed that in her situation receiving chemotherapy preoperatively that we do not have data shows leaving her lymph nodes behind it is safe. We discussed alternative of not removing her lymph nodes as they may well be treated with further chemotherapy and radiation but this would not be standard treatment. She understands and all her questions were answered and she desires to proceed with axillary lymph node dissection. We have this scheduled for her.

## 2013-01-25 NOTE — Patient Instructions (Addendum)
I will call you after discussing with Dr. Park Breed

## 2013-01-25 NOTE — Telephone Encounter (Signed)
Call the patient, left message

## 2013-01-30 ENCOUNTER — Ambulatory Visit (HOSPITAL_BASED_OUTPATIENT_CLINIC_OR_DEPARTMENT_OTHER): Payer: 59 | Admitting: Adult Health

## 2013-01-30 ENCOUNTER — Other Ambulatory Visit (HOSPITAL_BASED_OUTPATIENT_CLINIC_OR_DEPARTMENT_OTHER): Payer: 59 | Admitting: Lab

## 2013-01-30 ENCOUNTER — Ambulatory Visit (HOSPITAL_BASED_OUTPATIENT_CLINIC_OR_DEPARTMENT_OTHER): Payer: 59

## 2013-01-30 ENCOUNTER — Encounter: Payer: Self-pay | Admitting: Adult Health

## 2013-01-30 VITALS — BP 111/75 | HR 73 | Temp 97.7°F | Resp 20 | Ht 62.0 in | Wt 119.8 lb

## 2013-01-30 DIAGNOSIS — C50919 Malignant neoplasm of unspecified site of unspecified female breast: Secondary | ICD-10-CM

## 2013-01-30 DIAGNOSIS — C50219 Malignant neoplasm of upper-inner quadrant of unspecified female breast: Secondary | ICD-10-CM

## 2013-01-30 DIAGNOSIS — Z170 Estrogen receptor positive status [ER+]: Secondary | ICD-10-CM

## 2013-01-30 DIAGNOSIS — C50212 Malignant neoplasm of upper-inner quadrant of left female breast: Secondary | ICD-10-CM

## 2013-01-30 DIAGNOSIS — Z5112 Encounter for antineoplastic immunotherapy: Secondary | ICD-10-CM

## 2013-01-30 LAB — COMPREHENSIVE METABOLIC PANEL (CC13)
ALT: 16 U/L (ref 0–55)
AST: 15 U/L (ref 5–34)
Alkaline Phosphatase: 78 U/L (ref 40–150)
Creatinine: 0.8 mg/dL (ref 0.6–1.1)
Sodium: 139 mEq/L (ref 136–145)
Total Bilirubin: 0.26 mg/dL (ref 0.20–1.20)
Total Protein: 7.5 g/dL (ref 6.4–8.3)

## 2013-01-30 LAB — CBC WITH DIFFERENTIAL/PLATELET
BASO%: 0.4 % (ref 0.0–2.0)
Eosinophils Absolute: 0.7 10*3/uL — ABNORMAL HIGH (ref 0.0–0.5)
LYMPH%: 31.3 % (ref 14.0–49.7)
MCHC: 34.7 g/dL (ref 31.5–36.0)
MCV: 101.8 fL — ABNORMAL HIGH (ref 79.5–101.0)
MONO%: 6.4 % (ref 0.0–14.0)
NEUT#: 4.2 10*3/uL (ref 1.5–6.5)
Platelets: 219 10*3/uL (ref 145–400)
RBC: 3.82 10*6/uL (ref 3.70–5.45)
RDW: 16.7 % — ABNORMAL HIGH (ref 11.2–14.5)
WBC: 7.8 10*3/uL (ref 3.9–10.3)
nRBC: 0 % (ref 0–0)

## 2013-01-30 MED ORDER — ACETAMINOPHEN 325 MG PO TABS
650.0000 mg | ORAL_TABLET | Freq: Once | ORAL | Status: AC
  Administered 2013-01-30: 650 mg via ORAL

## 2013-01-30 MED ORDER — DIPHENHYDRAMINE HCL 25 MG PO CAPS
50.0000 mg | ORAL_CAPSULE | Freq: Once | ORAL | Status: AC
  Administered 2013-01-30: 25 mg via ORAL

## 2013-01-30 MED ORDER — TRASTUZUMAB CHEMO INJECTION 440 MG
6.0000 mg/kg | Freq: Once | INTRAVENOUS | Status: AC
  Administered 2013-01-30: 336 mg via INTRAVENOUS
  Filled 2013-01-30: qty 16

## 2013-01-30 MED ORDER — HEPARIN SOD (PORK) LOCK FLUSH 100 UNIT/ML IV SOLN
500.0000 [IU] | Freq: Once | INTRAVENOUS | Status: AC | PRN
  Administered 2013-01-30: 500 [IU]
  Filled 2013-01-30: qty 5

## 2013-01-30 MED ORDER — SODIUM CHLORIDE 0.9 % IV SOLN
Freq: Once | INTRAVENOUS | Status: AC
  Administered 2013-01-30: 11:00:00 via INTRAVENOUS

## 2013-01-30 MED ORDER — SODIUM CHLORIDE 0.9 % IJ SOLN
10.0000 mL | INTRAMUSCULAR | Status: DC | PRN
  Administered 2013-01-30: 10 mL
  Filled 2013-01-30: qty 10

## 2013-01-30 NOTE — Progress Notes (Signed)
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  PRIOR THERAPY:  #1 patient was originally seen in the multidisciplinary breast clinic when she found a left breast mass she eventually had MRI and mammograms performed. She was diagnosed with invasive ductal carcinoma that was ER positive PR positive HER-2/neu positive measuring 3.1 cm by MRI criteria. Ki-67 was 70% HER-2 was amplified with a ratio 2.91.  #2 patient was seen at the multidisciplinary breast clinic and a recommendation for neoadjuvant chemotherapy was recommended as patient was interested in breast conservation. She will receive neoadjuvant Taxotere carboplatinum Herceptin. Taxotere carboplatinum will be given every 21 days with weekly Herceptin. Total of 6 cycles of Taxotere and carboplatinum will be administered.  This was started 07/12/12.  3 patient had MRI of the breasts performed on 10/15/2012 which does show reduction in the size of the left breast mass from 3.1to 1.2. This is a wonderful response.  4. Patient begun on every 3 week herceptin beginning 11/29/12.  5. Patient underwent lumpectomy on 12/31/12 a 1.8 cm tumor was removed, the margins were 0.1cm and 1/1 sentinel nodes were positive disease.  A re-excision and axillary node dissection was recommended.   CURRENT THERAPY:   Herceptin every 3 weeks  INTERVAL HISTORY:  Patient returns in followup visit prior to every 3 week Herceptin.  She is feeling well today.  She underwent lumpectomy with sentinel node biopsy on 12/31/12, the margins were 0.1cm and 1/1 sentinel nodes were positive.  She has since f/u with Dr. Johna Sheriff and has re-excision and ALND scheduled on 02/08/13.  She is doing well and denies fevers, chills,  nausea, vomiting, constipation, diarrhea, numbness, orthopnea, DOE, PND, or any further concerns.  Otherwise, a 10 point ROS is neg.    Past Medical History  Diagnosis Date  . Breast cancer   . Headache(784.0)   . Back pain   . 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 [place] fever     ALLERGIES:  is allergic to aspirin and iodinated diagnostic agents.  MEDICATIONS:  Current Outpatient Prescriptions  Medication Sig Dispense Refill  . albuterol (VENTOLIN HFA) 108 (90 BASE) MCG/ACT inhaler Inhale 2 puffs into the lungs every 6 (six) hours as needed.  1 Inhaler  3  . cetirizine (ZYRTEC) 10 MG tablet Take 10 mg by mouth daily.      . chlorpheniramine-HYDROcodone (TUSSIONEX) 10-8 MG/5ML LQCR Take 5 mLs by mouth at bedtime as needed.  140 mL  0  . fluticasone (FLONASE) 50 MCG/ACT nasal spray Place 2 sprays into the nose daily.  16 g  2  . HYDROcodone-acetaminophen (NORCO/VICODIN) 5-325 MG per tablet Take 1-2 tablets by mouth every 4 (four) hours as needed for pain.  30 tablet  1  . lidocaine-prilocaine (EMLA) cream Apply topically as needed.  30 g  7  . naproxen sodium (ANAPROX) 220 MG tablet Take 220 mg by mouth 2 (two) times daily with a meal.      . omeprazole (PRILOSEC) 40 MG capsule TAKE 1 CAPSULE BY MOUTH DAILY  30 capsule  2   No current facility-administered medications for this visit.    SURGICAL HISTORY:  Past Surgical History  Procedure Laterality Date  .  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 Cesarean section  1995/1996  . Lobectomy  1997    upper left  . Portacath placement  07/02/2012    Procedure: INSERTION PORT-A-CATH;  Surgeon: Mariella Saa, MD;  Location: [facility];  Service: General;  Laterality: N/A;  right  . Breast surgery  2006    cyst rt br-neg  . Breast lumpectomy with needle localization and axillary sentinel lymph node bx Left 12/31/2012    Procedure: NEEDLE  LOCALIZATION LEFT BREAST LUMPECTOMY AND LEFT AXILLARY SENTENIAL LYMPH NODE BX;  Surgeon: Mariella Saa, MD;  Location:  SURGERY CENTER;  Service: General;  Laterality: Left;    REVIEW OF SYSTEMS:   General: fatigue (-), night sweats (-), fever (-), pain (-) Lymph: palpable nodes (-) HEENT: vision changes (-), mucositis (-), gum bleeding (-), epistaxis (-) Cardiovascular: chest pain (-), palpitations (-) Pulmonary: shortness of breath (-), dyspnea on exertion (-), cough (-), hemoptysis (-) GI:  Early satiety (-), melena (-), dysphagia (-), nausea/vomiting (-), diarrhea (-) GU: dysuria (-), hematuria (-), incontinence (-) Musculoskeletal: joint swelling (-), joint pain (-), back pain (-) Neuro: weakness (-), numbness (-), headache (-), confusion (-) Skin: Rash (-), lesions (-), dryness (-) Psych: depression (-), suicidal/homicidal ideation (-), feeling of hopelessness (-)   PHYSICAL EXAMINATION: Blood pressure 111/75, pulse 73, temperature 97.7 F (36.5 C), temperature source Oral, resp. rate 20, height 5\' 2"  (1.575 m), weight 119 lb 12.8 oz (54.341 kg). Body mass index is 21.91 kg/(m^2). General: Patient is a well appearing female in no acute distress HEENT: PERRLA, sclerae anicteric no conjunctival pallor, MMM, ulceration in bilateral buccal mucosa, blistering in corners of mouth.   Neck: supple, no palpable adenopathy Lungs: mild wheezing in the left lung.  Right lung clear to auscultation Cardiovascular: regular rate rhythm, S1, S2, no murmurs, rubs or gallops Abdomen: Soft, non-tender, non-distended, normoactive bowel sounds, no HSM Extremities: warm and well perfused, no clubbing, cyanosis, or edema Skin: No rashes or lesions Neuro: Non-focal Breasts: The right breast is unremarkable. Left breast lumpectomy site intact healing well with surgical glue in place, left axilla surgical site healing well, no erythema or exudate.   ECOG PERFORMANCE STATUS: 1  LABORATORY  DATA: Lab Results  Component Value Date   WBC 7.8 01/30/2013   HGB 13.5 01/30/2013   HCT 38.9 01/30/2013   MCV 101.8* 01/30/2013   PLT 219 01/30/2013      Chemistry      Component Value Date/Time   NA 139 01/09/2013 1349   NA 141 01/26/2011 1039   K 3.6 01/09/2013 1349   K 4.5 01/26/2011 1039   CL 100 12/20/2012 1509   CL 106 01/26/2011 1039   CO2 27 01/09/2013 1349   CO2 28 01/26/2011 1039   BUN 7.2 01/09/2013 1349   BUN 7 01/26/2011 1039   CREATININE 0.8 01/09/2013 1349   CREATININE 0.6 01/26/2011 1039      Component Value Date/Time   CALCIUM 9.9 01/09/2013 1349   CALCIUM 9.2 01/26/2011 1039   ALKPHOS 70 01/09/2013 1349   AST 19 01/09/2013 1349   ALT 17 01/09/2013 1349   BILITOT 0.25 01/09/2013 1349       RADIOGRAPHIC STUDIES: Dg Chest 2 View  07/30/2012  *RADIOLOGY REPORT*  Clinical Data: Bronchiectasis.  History of breast cancer.  CHEST - 2 VIEW  Comparison: 07/02/2012 and 05/03/2011  Findings: Power port in place.  There is now  an ill-defined 16 mm in density in the left lung base which correlates with the previous cystic lesion seen on CT scan of 01/26/2011.  There is central lucency.  I suspect this represents retraction and wall thickening of the previously seen area of cystic bronchiectasis.  Scarring at the left apex medially due to cystic bronchiectasis appears slightly less prominent.  The right lung is clear.  Heart size is normal.  Previous left upper lobectomy.  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  No other change since the prior exam.   Original Report Authenticated By: Francene Boyers, M.D.      ASSESSMENT: 47 year old [place] woman  with  #1 a clinical stage IIA (3.1 cm) invasive ductal carcinoma of the left breast status post needle core biopsy 06/05/2012 that showed ER positive PR positive HER-2/neu positive breast cancer with a Ki-67 at 70%.   #2 started chemotherapy on 07/13/2011 consisting of carboplatin,  docetaxel and trastuzumab every 3 weeks, with the trastuzumab given weekly  #3 trastuzumab to be continued to complete a year;  most recent Echo 11/19/12  #4 Lumpectomy and Sentinel node biopsy on 12/31/12, re-excision and ALND to be performed and arranged by Dr. Johna Sheriff and his team.   PLAN:   1. Ms. Cyr is feeling well today. She will proceed with Herceptin today.  She will continue on herceptin for one year, and undergo radiation following re-excision and ALND.  She will also go on Tamoxifen after radiation.        2. She will return in 3 weeks for Herceptin.  Re-excision and ALND scheduled on 02/08/13 by Dr. Johna Sheriff.    All questions were answered. The patient knows to call the clinic with any problems, questions or concerns. We can certainly see the patient much sooner if necessary.   I spent 25 minutes counseling the patient face to face. The total time spent in the appointment was 30 minutes.  Cherie Ouch Lyn Hollingshead, NP Medical Oncology [facility] Phone: [phone] 01/30/2013, 10:23 AM

## 2013-01-30 NOTE — Patient Instructions (Signed)
[facility] Discharge Instructions for Patients Receiving Chemotherapy  Today you received the following chemotherapy agents herceptin  To help prevent nausea and vomiting after your treatment, we encourage you to take your nausea medication as needed   If you develop nausea and vomiting that is not controlled by your nausea medication, call the clinic.   BELOW ARE SYMPTOMS THAT SHOULD BE REPORTED IMMEDIATELY:  *FEVER GREATER THAN 100.5 F  *CHILLS WITH OR WITHOUT FEVER  NAUSEA AND VOMITING THAT IS NOT CONTROLLED WITH YOUR NAUSEA MEDICATION  *UNUSUAL SHORTNESS OF BREATH  *UNUSUAL BRUISING OR BLEEDING  TENDERNESS IN MOUTH AND THROAT WITH OR WITHOUT PRESENCE OF ULCERS  *URINARY PROBLEMS  *BOWEL PROBLEMS  UNUSUAL RASH Items with * indicate a potential emergency and should be followed up as soon as possible.  Feel free to call the clinic you have any questions or concerns. The clinic phone number is [phone].    

## 2013-01-30 NOTE — Patient Instructions (Signed)
Doing well.  Proceed with Herceptin.  Please call us if you have any questions or concerns.    

## 2013-01-31 ENCOUNTER — Telehealth (INDEPENDENT_AMBULATORY_CARE_PROVIDER_SITE_OTHER): Payer: Self-pay

## 2013-01-31 NOTE — Telephone Encounter (Signed)
Mailed copy of AVS to patient, last visit our computers were down and not able to print and give to patient at check out

## 2013-02-04 ENCOUNTER — Encounter (HOSPITAL_BASED_OUTPATIENT_CLINIC_OR_DEPARTMENT_OTHER): Payer: Self-pay | Admitting: *Deleted

## 2013-02-04 NOTE — Progress Notes (Signed)
No labs needed

## 2013-02-08 ENCOUNTER — Encounter (HOSPITAL_BASED_OUTPATIENT_CLINIC_OR_DEPARTMENT_OTHER)
Admission: RE | Disposition: A | Discharge status: Home / Self Care | Payer: Self-pay | Source: Ambulatory Visit | Attending: General Surgery

## 2013-02-08 ENCOUNTER — Encounter (HOSPITAL_BASED_OUTPATIENT_CLINIC_OR_DEPARTMENT_OTHER): Payer: Self-pay | Admitting: Anesthesiology

## 2013-02-08 ENCOUNTER — Ambulatory Visit (HOSPITAL_BASED_OUTPATIENT_CLINIC_OR_DEPARTMENT_OTHER): Payer: 59 | Admitting: Anesthesiology

## 2013-02-08 ENCOUNTER — Ambulatory Visit (HOSPITAL_BASED_OUTPATIENT_CLINIC_OR_DEPARTMENT_OTHER)
Admission: RE | Admit: 2013-02-08 | Discharge: 2013-02-09 | Disposition: A | Discharge status: Home / Self Care | Payer: 59 | Source: Ambulatory Visit | Attending: General Surgery | Admitting: General Surgery

## 2013-02-08 ENCOUNTER — Encounter (HOSPITAL_BASED_OUTPATIENT_CLINIC_OR_DEPARTMENT_OTHER): Payer: Self-pay

## 2013-02-08 DIAGNOSIS — K219 Gastro-esophageal reflux disease without esophagitis: Secondary | ICD-10-CM | POA: Insufficient documentation

## 2013-02-08 DIAGNOSIS — J45909 Unspecified asthma, uncomplicated: Secondary | ICD-10-CM | POA: Insufficient documentation

## 2013-02-08 DIAGNOSIS — F17200 Nicotine dependence, unspecified, uncomplicated: Secondary | ICD-10-CM | POA: Insufficient documentation

## 2013-02-08 DIAGNOSIS — C773 Secondary and unspecified malignant neoplasm of axilla and upper limb lymph nodes: Secondary | ICD-10-CM | POA: Insufficient documentation

## 2013-02-08 DIAGNOSIS — C50919 Malignant neoplasm of unspecified site of unspecified female breast: Secondary | ICD-10-CM | POA: Insufficient documentation

## 2013-02-08 DIAGNOSIS — C50212 Malignant neoplasm of upper-inner quadrant of left female breast: Secondary | ICD-10-CM

## 2013-02-08 DIAGNOSIS — Z9221 Personal history of antineoplastic chemotherapy: Secondary | ICD-10-CM | POA: Insufficient documentation

## 2013-02-08 HISTORY — DX: Presence of dental prosthetic device (complete) (partial): Z97.2

## 2013-02-08 HISTORY — PX: AXILLARY LYMPH NODE DISSECTION: SHX5229

## 2013-02-08 SURGERY — LYMPHADENECTOMY, AXILLARY
Anesthesia: General | Site: Axilla | Laterality: Left | Wound class: Clean

## 2013-02-08 MED ORDER — HEPARIN SODIUM (PORCINE) 5000 UNIT/ML IJ SOLN
5000.0000 [IU] | Freq: Three times a day (TID) | INTRAMUSCULAR | Status: DC
  Administered 2013-02-09: 5000 [IU] via SUBCUTANEOUS

## 2013-02-08 MED ORDER — ONDANSETRON HCL 4 MG PO TABS: 4.0000 mg | ORAL_TABLET | Freq: Four times a day (QID) | ORAL | Status: DC | PRN

## 2013-02-08 MED ORDER — FENTANYL CITRATE 0.05 MG/ML IJ SOLN: 50.0000 ug | INTRAMUSCULAR | Status: DC | PRN

## 2013-02-08 MED ORDER — ACETAMINOPHEN-CODEINE #3 300-30 MG PO TABS
1.0000 | ORAL_TABLET | ORAL | Status: DC | PRN
  Administered 2013-02-09 (×2): 1 via ORAL

## 2013-02-08 MED ORDER — OXYCODONE HCL 5 MG PO TABS: 5.0000 mg | ORAL_TABLET | Freq: Once | ORAL | Status: AC | PRN

## 2013-02-08 MED ORDER — HYDROMORPHONE HCL PF 1 MG/ML IJ SOLN
0.2500 mg | INTRAMUSCULAR | Status: DC | PRN
  Administered 2013-02-08 (×2): 0.5 mg via INTRAVENOUS

## 2013-02-08 MED ORDER — OXYCODONE-ACETAMINOPHEN 5-325 MG PO TABS: 1.0000 | ORAL_TABLET | ORAL | Status: DC | PRN

## 2013-02-08 MED ORDER — FENTANYL CITRATE 0.05 MG/ML IJ SOLN
INTRAMUSCULAR | Status: DC | PRN
  Administered 2013-02-08: 100 ug via INTRAVENOUS
  Administered 2013-02-08: 50 ug via INTRAVENOUS
  Administered 2013-02-08: 25 ug via INTRAVENOUS

## 2013-02-08 MED ORDER — OXYCODONE HCL 5 MG/5ML PO SOLN: 5.0000 mg | Freq: Once | ORAL | Status: AC | PRN

## 2013-02-08 MED ORDER — HYDROCOD POLST-CHLORPHEN POLST 10-8 MG/5ML PO LQCR: 5.0000 mL | Freq: Every evening | ORAL | Status: DC | PRN

## 2013-02-08 MED ORDER — LACTATED RINGERS IV SOLN
INTRAVENOUS | Status: DC
  Administered 2013-02-08: 50 mL/h via INTRAVENOUS

## 2013-02-08 MED ORDER — ONDANSETRON HCL 4 MG/2ML IJ SOLN
4.0000 mg | Freq: Four times a day (QID) | INTRAMUSCULAR | Status: DC | PRN
  Administered 2013-02-08: 4 mg via INTRAVENOUS

## 2013-02-08 MED ORDER — DEXAMETHASONE SODIUM PHOSPHATE 4 MG/ML IJ SOLN
INTRAMUSCULAR | Status: DC | PRN
  Administered 2013-02-08: 10 mg via INTRAVENOUS

## 2013-02-08 MED ORDER — MIDAZOLAM HCL 5 MG/5ML IJ SOLN
INTRAMUSCULAR | Status: DC | PRN
  Administered 2013-02-08: 2 mg via INTRAVENOUS

## 2013-02-08 MED ORDER — PROMETHAZINE HCL 25 MG/ML IJ SOLN
12.5000 mg | INTRAMUSCULAR | Status: DC | PRN
  Administered 2013-02-08: 12.5 mg via INTRAVENOUS

## 2013-02-08 MED ORDER — OXYCODONE-ACETAMINOPHEN 5-325 MG PO TABS
1.0000 | ORAL_TABLET | ORAL | Status: DC | PRN
  Administered 2013-02-08: 2 via ORAL

## 2013-02-08 MED ORDER — LACTATED RINGERS IV SOLN
INTRAVENOUS | Status: DC
  Administered 2013-02-08 (×2): via INTRAVENOUS

## 2013-02-08 MED ORDER — ALBUTEROL SULFATE HFA 108 (90 BASE) MCG/ACT IN AERS: 2.0000 | INHALATION_SPRAY | Freq: Four times a day (QID) | RESPIRATORY_TRACT | Status: DC | PRN

## 2013-02-08 MED ORDER — MIDAZOLAM HCL 2 MG/2ML IJ SOLN: 1.0000 mg | INTRAMUSCULAR | Status: DC | PRN

## 2013-02-08 MED ORDER — BUPIVACAINE-EPINEPHRINE 0.25% -1:200000 IJ SOLN
INTRAMUSCULAR | Status: DC | PRN
  Administered 2013-02-08: 10 mL

## 2013-02-08 MED ORDER — MORPHINE SULFATE 2 MG/ML IJ SOLN: 2.0000 mg | INTRAMUSCULAR | Status: DC | PRN

## 2013-02-08 MED ORDER — FLUTICASONE PROPIONATE 50 MCG/ACT NA SUSP: 2.0000 | Freq: Every day | NASAL | Status: DC

## 2013-02-08 MED ORDER — FLEET ENEMA 7-19 GM/118ML RE ENEM: 1.0000 | ENEMA | Freq: Once | RECTAL | Status: DC

## 2013-02-08 MED ORDER — LORATADINE 10 MG PO TABS: 10.0000 mg | ORAL_TABLET | Freq: Every day | ORAL | Status: DC

## 2013-02-08 MED ORDER — PANTOPRAZOLE SODIUM 40 MG PO TBEC: 40.0000 mg | DELAYED_RELEASE_TABLET | Freq: Every day | ORAL | Status: DC

## 2013-02-08 MED ORDER — LIDOCAINE HCL (CARDIAC) 20 MG/ML IV SOLN
INTRAVENOUS | Status: DC | PRN
  Administered 2013-02-08: 40 mg via INTRAVENOUS

## 2013-02-08 MED ORDER — CEFAZOLIN SODIUM-DEXTROSE 2-3 GM-% IV SOLR
2.0000 g | INTRAVENOUS | Status: AC
  Administered 2013-02-08: 2 g via INTRAVENOUS

## 2013-02-08 MED ORDER — PROPOFOL 10 MG/ML IV BOLUS
INTRAVENOUS | Status: DC | PRN
  Administered 2013-02-08: 140 mg via INTRAVENOUS

## 2013-02-08 SURGICAL SUPPLY — 53 items
APPLIER CLIP 11 MED OPEN (CLIP) ×2
APPLIER CLIP 9.375 MED OPEN (MISCELLANEOUS) ×2
BLADE SURG 15 STRL LF DISP TIS (BLADE) ×1 IMPLANT
BLADE SURG 15 STRL SS (BLADE) ×1
BLADE SURG ROTATE 9660 (MISCELLANEOUS) ×2 IMPLANT
CANISTER SUCTION 1200CC (MISCELLANEOUS) ×2 IMPLANT
CHLORAPREP W/TINT 26ML (MISCELLANEOUS) ×2 IMPLANT
CLEANER CAUTERY TIP 5X5 PAD (MISCELLANEOUS) ×1 IMPLANT
CLIP APPLIE 11 MED OPEN (CLIP) ×1 IMPLANT
CLIP APPLIE 9.375 MED OPEN (MISCELLANEOUS) ×1 IMPLANT
CLIP TI WIDE RED SMALL 6 (CLIP) IMPLANT
CLOTH BEACON ORANGE TIMEOUT ST (SAFETY) ×2 IMPLANT
COVER MAYO STAND STRL (DRAPES) ×2 IMPLANT
COVER TABLE BACK 60X90 (DRAPES) ×2 IMPLANT
DECANTER SPIKE VIAL GLASS SM (MISCELLANEOUS) IMPLANT
DERMABOND ADVANCED (GAUZE/BANDAGES/DRESSINGS) ×1
DERMABOND ADVANCED .7 DNX12 (GAUZE/BANDAGES/DRESSINGS) ×1 IMPLANT
DRAIN CHANNEL 19F RND (DRAIN) ×2 IMPLANT
DRAIN HEMOVAC 1/8 X 5 (WOUND CARE) ×2 IMPLANT
DRAPE LAPAROSCOPIC ABDOMINAL (DRAPES) ×2 IMPLANT
DRAPE UTILITY XL STRL (DRAPES) ×2 IMPLANT
ELECT COATED BLADE 2.86 ST (ELECTRODE) ×2 IMPLANT
ELECT REM PT RETURN 9FT ADLT (ELECTROSURGICAL) ×2
ELECTRODE REM PT RTRN 9FT ADLT (ELECTROSURGICAL) ×1 IMPLANT
EVACUATOR SILICONE 100CC (DRAIN) ×2 IMPLANT
GAUZE SPONGE 4X4 12PLY STRL LF (GAUZE/BANDAGES/DRESSINGS) ×4 IMPLANT
GLOVE BIOGEL PI IND STRL 8 (GLOVE) ×1 IMPLANT
GLOVE BIOGEL PI INDICATOR 8 (GLOVE) ×1
GLOVE SS BIOGEL STRL SZ 7.5 (GLOVE) ×1 IMPLANT
GLOVE SUPERSENSE BIOGEL SZ 7.5 (GLOVE) ×1
GLOVE SURG SS PI 7.0 STRL IVOR (GLOVE) ×2 IMPLANT
GOWN PREVENTION PLUS XLARGE (GOWN DISPOSABLE) ×4 IMPLANT
GOWN PREVENTION PLUS XXLARGE (GOWN DISPOSABLE) ×2 IMPLANT
NEEDLE HYPO 25X1 1.5 SAFETY (NEEDLE) ×2 IMPLANT
PACK BASIN DAY SURGERY FS (CUSTOM PROCEDURE TRAY) ×2 IMPLANT
PAD CLEANER CAUTERY TIP 5X5 (MISCELLANEOUS) ×1
PENCIL BUTTON HOLSTER BLD 10FT (ELECTRODE) ×2 IMPLANT
SHEET MEDIUM DRAPE 40X70 STRL (DRAPES) IMPLANT
SLEEVE SCD COMPRESS KNEE MED (MISCELLANEOUS) ×2 IMPLANT
SPONGE LAP 4X18 X RAY DECT (DISPOSABLE) ×2 IMPLANT
STAPLER VISISTAT 35W (STAPLE) IMPLANT
SUT ETHILON 3 0 PS 1 (SUTURE) ×2 IMPLANT
SUT MNCRL AB 4-0 PS2 18 (SUTURE) ×2 IMPLANT
SUT MON AB 3-0 SH 27 (SUTURE)
SUT MON AB 3-0 SH27 (SUTURE) IMPLANT
SUT VIC AB 4-0 BRD 54 (SUTURE) IMPLANT
SUT VICRYL 3-0 CR8 SH (SUTURE) ×2 IMPLANT
SYR BULB 3OZ (MISCELLANEOUS) IMPLANT
SYR CONTROL 10ML LL (SYRINGE) ×2 IMPLANT
TOWEL OR 17X24 6PK STRL BLUE (TOWEL DISPOSABLE) ×2 IMPLANT
TOWEL OR NON WOVEN STRL DISP B (DISPOSABLE) ×2 IMPLANT
TUBE CONNECTING 20X1/4 (TUBING) ×2 IMPLANT
YANKAUER SUCT BULB TIP NO VENT (SUCTIONS) ×2 IMPLANT

## 2013-02-08 NOTE — Transfer of Care (Signed)
Immediate Anesthesia Transfer of Care Note  Patient: Melissa [name]  Procedure(s) Performed: Procedure(s): LEFT AXILLARY DISSECTION (Left)  Patient Location: PACU  Anesthesia Type:General  Level of Consciousness: awake, sedated and responds to stimulation  Airway & Oxygen Therapy: Patient Spontanous Breathing and Patient connected to face mask oxygen  Post-op Assessment: Report given to PACU RN and Post -op Vital signs reviewed and stable  Post vital signs: Reviewed and stable  Complications: No apparent anesthesia complications

## 2013-02-08 NOTE — Anesthesia Procedure Notes (Signed)
Procedure Name: LMA Insertion Date/Time: 02/08/2013 12:05 PM Performed by: Meyer Russel Pre-anesthesia Checklist: Patient identified, Emergency Drugs available, Suction available and Patient being monitored Patient Re-evaluated:Patient Re-evaluated prior to inductionOxygen Delivery Method: Circle System Utilized Preoxygenation: Pre-oxygenation with 100% oxygen Intubation Type: IV induction Ventilation: Mask ventilation without difficulty LMA: LMA inserted LMA Size: 4.0 Number of attempts: 1 Airway Equipment and Method: bite block Placement Confirmation: positive ETCO2 and breath sounds checked- equal and bilateral Tube secured with: Tape Dental Injury: Teeth and Oropharynx as per pre-operative assessment

## 2013-02-08 NOTE — H&P (Signed)
  Subjective:    Melissa [name] is a 47 y.o. female who presents for  Left axillary sentinel lymph node dissection. She is status post left breast lumpectomy and sentinel lymph node biopsy following neoadjuvant chemotherapy. Her sentinel lymph node final pathology was positive for metastatic breast carcinoma. We discussed options at this point and I recommended proceeding with completion axillary dissection and she is in agreement.    Objective:   BP 107/71  Pulse 78  Temp(Src) 98.2 F (36.8 C) (Oral)  Resp 18  Ht 5\' 2"  (1.575 m)  Wt 119 lb 8 oz (54.205 kg)  BMI 21.85 kg/m2  SpO2 99%  General:  alert, cooperative and no distress Skin:  normal and no rash or abnormalities  Lymph Nodes:  Cervical, supraclavicular, and axillary nodes normal. Lungs:  clear to auscultation bilaterally Heart:  regular rate and rhythm, S1, S2 normal, no murmur, click, rub or gallop  Extremities:  extremities normal, atraumatic, no cyanosis or edema Neurologic:  alert and oriented with normal affect     Assessment and Plan: cancer of the left breast status post neoadjuvant chemotherapy, lumpectomy and sentinel lymph node biopsy with positive sentinel lymph node. She presents for left axillary dissection. We've previously discussed the indications for the surgery and expected recovery as well as risks of anesthetic complications, bleeding, infection, lymphedema and small chance of nerve or vascular injury.  Mariella Saa MD, FACS  02/08/2013, 11:55 AM

## 2013-02-08 NOTE — Anesthesia Postprocedure Evaluation (Signed)
  Anesthesia Post-op Note  Patient: Melissa [name]  Procedure(s) Performed: Procedure(s): LEFT AXILLARY DISSECTION (Left)  Patient Location: PACU  Anesthesia Type:General  Level of Consciousness: awake and alert   Airway and Oxygen Therapy: Patient Spontanous Breathing and Patient connected to face mask oxygen  Post-op Pain: mild  Post-op Assessment: Post-op Vital signs reviewed, Patient's Cardiovascular Status Stable, Respiratory Function Stable, Patent Airway and No signs of Nausea or vomiting  Post-op Vital Signs: Reviewed and stable  Complications: No apparent anesthesia complications

## 2013-02-08 NOTE — Anesthesia Preprocedure Evaluation (Addendum)
Anesthesia Evaluation  Patient identified by MRN, date of birth, ID band Patient awake    Reviewed: Allergy & Precautions, H&P , NPO status , Patient's Chart, lab work & pertinent test results  Airway Mallampati: I TM Distance: >3 FB Neck ROM: Full    Dental no notable dental hx. (+) Edentulous Upper, Partial Lower and Dental Advisory Given   Pulmonary asthma , Current Smoker,  breath sounds clear to auscultation  Pulmonary exam normal       Cardiovascular negative cardio ROS  Rhythm:Regular Rate:Normal     Neuro/Psych  Headaches, negative psych ROS   GI/Hepatic Neg liver ROS, GERD-  Medicated and Controlled,  Endo/Other  negative endocrine ROS  Renal/GU negative Renal ROS  negative genitourinary   Musculoskeletal   Abdominal   Peds  Hematology negative hematology ROS (+)   Anesthesia Other Findings   Reproductive/Obstetrics negative OB ROS                          Anesthesia Physical Anesthesia Plan  ASA: II  Anesthesia Plan: General   Post-op Pain Management:    Induction: Intravenous  Airway Management Planned: LMA  Additional Equipment:   Intra-op Plan:   Post-operative Plan: Extubation in OR  Informed Consent: I have reviewed the patients History and Physical, chart, labs and discussed the procedure including the risks, benefits and alternatives for the proposed anesthesia with the patient or authorized representative who has indicated his/her understanding and acceptance.   Dental advisory given  Plan Discussed with: CRNA  Anesthesia Plan Comments:         Anesthesia Quick Evaluation

## 2013-02-08 NOTE — Op Note (Signed)
Preoperative Diagnosis: cancer left breast  Postoprative Diagnosis: cancer left breast  Procedure: Procedure(s): LEFT AXILLARY DISSECTION   Surgeon: Glenna Fellows T   Assistants: None  Anesthesia:  General LMA anesthesia  Indications: patient is a 47 year old female with a recent diagnosis of left breast cancer status post neoadjuvant chemotherapy and left breast lumpectomy and sentinel lymph node biopsy. Her sentinel lymph node came back positive for metastatic mammary carcinoma and we after discussion detailed elsewhere including indications and risks and elected to proceed with left axillary dissection.  Procedure Detail:  Patient is brought to the operating room, placed in the supine position on the operating table, and laryngeal mask general anesthesia induced. She received preoperative IV antibiotics. PAS were in place. The left breast and axilla and upper arm were widely sterilely prepped and draped. Patient time out was performed and correct procedure verified. The previous axillary transverse incision was extended somewhat anteriorly and posteriorly and dissection was carried down through the subcutaneous tissue. The dissection was deepened down anteriorly to the lateral border of the pectoralis major which was cleared and posteriorly down to the anterior border of the latissimus dorsi which was also cleared. The axillary contents were separated to some degree from the tail of Spence of the breast with cautery. Following this the pectoralis major was retracted medially and the clavipectoral fascia sharply incised. The pectoralis minor was retracted medially. The axillary vein was identified at the medial border of the pectoralis minor. All fibrofatty tissue inferior to the vein was then dissected laterally. Perforating branches of the axillary artery and vein were controlled with clips. As the dissection progressed laterally I identified the long thoracic nerve and the thoracodorsal  nerve and vessels and these structures were carefully protected. All tissue between these structures down to the subscapularis was dissected and swept inferiorly. Tissue underneath the vein and over the latissimus was dissected inferiorly. This continued until below the insertion of the nerves and final attachments along the latissimus and serratus and final attachments to the axillary tail of Spence were divided and the specimen removed. It was oriented with a suture on the highest nodes and sent for permanent pathology. The wound was irrigated and complete hemostasis assured. Both nerves were intact and functioning. A closed suction drain was brought out through a separate stab wound and left in the axilla. The subcutaneous tissue was closed with interrupted 3-0 Vicryl. Skin was closed with staples. Sponge needle and instrument counts were correct.   Estimated Blood Loss:  Minimal         Drains: #19 round Blake drain  Blood Given: none          Specimens: left axillary contents        Complications:  * No complications entered in OR log *         Disposition: PACU - hemodynamically stable.         Condition: stable

## 2013-02-09 NOTE — Progress Notes (Signed)
Tylenol #3 1-2 po Q 4 hrs PRN #30 Called in to [facility] in [place] @ [phone] At 0910 on 02/09/13 @ 0910.

## 2013-02-11 ENCOUNTER — Telehealth (INDEPENDENT_AMBULATORY_CARE_PROVIDER_SITE_OTHER): Payer: Self-pay | Admitting: General Surgery

## 2013-02-11 ENCOUNTER — Telehealth (INDEPENDENT_AMBULATORY_CARE_PROVIDER_SITE_OTHER): Payer: Self-pay

## 2013-02-11 NOTE — Telephone Encounter (Signed)
Called and left message about path report

## 2013-02-11 NOTE — Telephone Encounter (Signed)
Patient is asking for J.P drain removal appointment ; states she is doing good Jp drain is 22cc or less X3 days. appt 02/15/13 @ 1100 am  Advised to call if condition changed

## 2013-02-12 ENCOUNTER — Encounter (HOSPITAL_BASED_OUTPATIENT_CLINIC_OR_DEPARTMENT_OTHER): Payer: Self-pay | Admitting: General Surgery

## 2013-02-15 ENCOUNTER — Ambulatory Visit (INDEPENDENT_AMBULATORY_CARE_PROVIDER_SITE_OTHER): Payer: 59

## 2013-02-15 VITALS — Temp 98.0°F | Wt 119.0 lb

## 2013-02-15 DIAGNOSIS — Z4802 Encounter for removal of sutures: Secondary | ICD-10-CM

## 2013-02-15 NOTE — Progress Notes (Signed)
Pt in office for wd ck and drain removal. Wd clean and dry. No redness. No fever. Clear serous fluid in JP drain. Pt total drainage 54cc in 24hrs. Reviewed with Neysa Bonito. Wound redressed with clean dry gauze bandage. Pt given appt for next week to have drain ck. Pt offered Monday appt but declined and requested Weds since she will be in town having chemo. Appt made for Weds. Pt to keep drain record and call if drainage changes color, fever or redness occurs.

## 2013-02-18 ENCOUNTER — Encounter (INDEPENDENT_AMBULATORY_CARE_PROVIDER_SITE_OTHER): Payer: 59

## 2013-02-19 ENCOUNTER — Telehealth: Payer: Self-pay | Admitting: *Deleted

## 2013-02-19 NOTE — Telephone Encounter (Signed)
Patient called regarding her appts for tomorrow. Patient stated that she has a drain and wanted to know if she can still have chemo. I have told the patient that the desk RN will call her back.  MW

## 2013-02-20 ENCOUNTER — Ambulatory Visit (HOSPITAL_BASED_OUTPATIENT_CLINIC_OR_DEPARTMENT_OTHER): Payer: 59 | Admitting: Adult Health

## 2013-02-20 ENCOUNTER — Other Ambulatory Visit (HOSPITAL_BASED_OUTPATIENT_CLINIC_OR_DEPARTMENT_OTHER): Payer: 59 | Admitting: Lab

## 2013-02-20 ENCOUNTER — Ambulatory Visit (INDEPENDENT_AMBULATORY_CARE_PROVIDER_SITE_OTHER): Payer: 59

## 2013-02-20 ENCOUNTER — Encounter (INDEPENDENT_AMBULATORY_CARE_PROVIDER_SITE_OTHER): Payer: Self-pay

## 2013-02-20 ENCOUNTER — Ambulatory Visit (HOSPITAL_BASED_OUTPATIENT_CLINIC_OR_DEPARTMENT_OTHER): Payer: 59

## 2013-02-20 ENCOUNTER — Encounter: Payer: Self-pay | Admitting: Adult Health

## 2013-02-20 VITALS — BP 108/71 | HR 73 | Temp 98.3°F | Resp 18 | Ht 62.0 in | Wt 119.2 lb

## 2013-02-20 DIAGNOSIS — C50212 Malignant neoplasm of upper-inner quadrant of left female breast: Secondary | ICD-10-CM

## 2013-02-20 DIAGNOSIS — C50919 Malignant neoplasm of unspecified site of unspecified female breast: Secondary | ICD-10-CM

## 2013-02-20 DIAGNOSIS — Z4802 Encounter for removal of sutures: Secondary | ICD-10-CM

## 2013-02-20 DIAGNOSIS — Z4803 Encounter for change or removal of drains: Secondary | ICD-10-CM

## 2013-02-20 DIAGNOSIS — C50219 Malignant neoplasm of upper-inner quadrant of unspecified female breast: Secondary | ICD-10-CM

## 2013-02-20 DIAGNOSIS — F411 Generalized anxiety disorder: Secondary | ICD-10-CM

## 2013-02-20 DIAGNOSIS — Z170 Estrogen receptor positive status [ER+]: Secondary | ICD-10-CM

## 2013-02-20 DIAGNOSIS — Z5112 Encounter for antineoplastic immunotherapy: Secondary | ICD-10-CM

## 2013-02-20 DIAGNOSIS — Z4889 Encounter for other specified surgical aftercare: Secondary | ICD-10-CM

## 2013-02-20 LAB — CBC WITH DIFFERENTIAL/PLATELET
BASO%: 0.5 % (ref 0.0–2.0)
Basophils Absolute: 0 10*3/uL (ref 0.0–0.1)
EOS%: 6.7 % (ref 0.0–7.0)
HCT: 38.4 % (ref 34.8–46.6)
HGB: 13.1 g/dL (ref 11.6–15.9)
LYMPH%: 31.5 % (ref 14.0–49.7)
MCH: 34.3 pg — ABNORMAL HIGH (ref 25.1–34.0)
MCHC: 34.1 g/dL (ref 31.5–36.0)
MCV: 100.5 fL (ref 79.5–101.0)
MONO%: 6.4 % (ref 0.0–14.0)
NEUT%: 54.9 % (ref 38.4–76.8)
Platelets: 235 10*3/uL (ref 145–400)
lymph#: 2.7 10*3/uL (ref 0.9–3.3)

## 2013-02-20 LAB — COMPREHENSIVE METABOLIC PANEL (CC13)
AST: 16 U/L (ref 5–34)
Alkaline Phosphatase: 72 U/L (ref 40–150)
BUN: 7.7 mg/dL (ref 7.0–26.0)
Creatinine: 0.8 mg/dL (ref 0.6–1.1)
Glucose: 84 mg/dl (ref 70–140)
Total Bilirubin: 0.26 mg/dL (ref 0.20–1.20)

## 2013-02-20 MED ORDER — SODIUM CHLORIDE 0.9 % IV SOLN
6.0000 mg/kg | Freq: Once | INTRAVENOUS | Status: AC
  Administered 2013-02-20: 336 mg via INTRAVENOUS
  Filled 2013-02-20: qty 16

## 2013-02-20 MED ORDER — LORAZEPAM 1 MG PO TABS
0.5000 mg | ORAL_TABLET | Freq: Once | ORAL | Status: AC
  Administered 2013-02-20: 0.5 mg via ORAL

## 2013-02-20 MED ORDER — ACETAMINOPHEN 325 MG PO TABS
650.0000 mg | ORAL_TABLET | Freq: Once | ORAL | Status: AC
  Administered 2013-02-20: 650 mg via ORAL

## 2013-02-20 MED ORDER — HEPARIN SOD (PORK) LOCK FLUSH 100 UNIT/ML IV SOLN
500.0000 [IU] | Freq: Once | INTRAVENOUS | Status: AC | PRN
  Administered 2013-02-20: 500 [IU]
  Filled 2013-02-20: qty 5

## 2013-02-20 MED ORDER — SODIUM CHLORIDE 0.9 % IJ SOLN
10.0000 mL | INTRAMUSCULAR | Status: DC | PRN
  Administered 2013-02-20: 10 mL
  Filled 2013-02-20: qty 10

## 2013-02-20 MED ORDER — DIPHENHYDRAMINE HCL 25 MG PO CAPS
50.0000 mg | ORAL_CAPSULE | Freq: Once | ORAL | Status: AC
  Administered 2013-02-20: 25 mg via ORAL

## 2013-02-20 MED ORDER — SODIUM CHLORIDE 0.9 % IV SOLN
Freq: Once | INTRAVENOUS | Status: AC
  Administered 2013-02-20: 12:00:00 via INTRAVENOUS

## 2013-02-20 NOTE — Progress Notes (Signed)
Pt is here today s/p left axillary dissection on 02/28/13, for drain and staple removal.  Drain was removed after determining discharge of less than 30cc's over 48 hour period.  Drain opening was covered with a dry gauze dressing. Staples were removed and steri strips placed.  Pt tolerated procedure very well.  Instructions for showering were given. She confirmed she was aware of her post op appointment with Dr. Johna Sheriff.

## 2013-02-20 NOTE — Progress Notes (Signed)
Patient ID: Melissa [name], female   DOB: [birth year], 47 y.o.   MRN: [id number] Error on surgery date in previous nurse visit documentation.  Correct date is 02/08/13.

## 2013-02-20 NOTE — Patient Instructions (Addendum)
Doing well.  Proceed with Herceptin.  Please call us if you have any questions or concerns.    

## 2013-02-20 NOTE — Patient Instructions (Addendum)
[facility] Discharge Instructions for Patients Receiving Chemotherapy  Today you received the following chemotherapy agents :  Herceptin.  To help prevent nausea and vomiting after your treatment, we encourage you to take your nausea medication as instructed by your physician.   If you develop nausea and vomiting that is not controlled by your nausea medication, call the clinic.   BELOW ARE SYMPTOMS THAT SHOULD BE REPORTED IMMEDIATELY:  *FEVER GREATER THAN 100.5 F  *CHILLS WITH OR WITHOUT FEVER  NAUSEA AND VOMITING THAT IS NOT CONTROLLED WITH YOUR NAUSEA MEDICATION  *UNUSUAL SHORTNESS OF BREATH  *UNUSUAL BRUISING OR BLEEDING  TENDERNESS IN MOUTH AND THROAT WITH OR WITHOUT PRESENCE OF ULCERS  *URINARY PROBLEMS  *BOWEL PROBLEMS  UNUSUAL RASH Items with * indicate a potential emergency and should be followed up as soon as possible.  Feel free to call the clinic you have any questions or concerns. The clinic phone number is [phone].    

## 2013-02-20 NOTE — Progress Notes (Signed)
OFFICE PROGRESS NOTE  CC  PCP: [name], MD   [name], MD 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  PRIOR THERAPY:  #1 patient was originally seen in the multidisciplinary breast clinic when she found a left breast mass she eventually had MRI and mammograms performed. She was diagnosed with invasive ductal carcinoma that was ER positive PR positive HER-2/neu positive measuring 3.1 cm by MRI criteria. Ki-67 was 70% HER-2 was amplified with a ratio 2.91.  #2 patient was seen at the multidisciplinary breast clinic and a recommendation for neoadjuvant chemotherapy was recommended as patient was interested in breast conservation. She will receive neoadjuvant Taxotere carboplatinum Herceptin. Taxotere carboplatinum will be given every 21 days with weekly Herceptin. Total of 6 cycles of Taxotere and carboplatinum will be administered.  This was started 07/12/12.  3 patient had MRI of the breasts performed on 10/15/2012 which does show reduction in the size of the left breast mass from 3.1to 1.2. This is a wonderful response.  4. Patient begun on every 3 week herceptin beginning 11/29/12.  5. Patient underwent lumpectomy on 12/31/12 a 1.8 cm tumor was removed, the margins were 0.1cm and 1/1 sentinel nodes were positive disease.  A re-excision and axillary node dissection was recommended by Dr. Welton Flakes. An ALND occurred on 02/08/13 and 0/13 lymph nodes were positive for metastases.    CURRENT THERAPY:   Herceptin every 3 weeks  INTERVAL HISTORY:  Patient returns in followup visit prior to every 3 week Herceptin. She underwent ALND on 02/08/13 by Dr. Johna Sheriff.  She tolerated this well and is going to have the drain pulled today at his office.  She denies fevers, chills, swelling, shortness of breath, chest  pain, palpitations, or any other concerns.  She is very nervous about having the drain pulled and staples removed today.  Otherwise, a 10 point ROS is negative.    Past Medical History  Diagnosis Date  . Breast cancer   . Headache(784.0)   . Back pain   . 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 [place] fever   . Wears dentures     top    ALLERGIES:  is allergic to aspirin and iodinated diagnostic agents.  MEDICATIONS:  Current Outpatient Prescriptions  Medication Sig Dispense Refill  . albuterol (VENTOLIN HFA) 108 (90 BASE) MCG/ACT inhaler Inhale 2 puffs into the lungs every 6 (six) hours as needed.  1 Inhaler  3  . cetirizine (ZYRTEC) 10 MG tablet Take 10 mg by mouth daily.      . chlorpheniramine-HYDROcodone (TUSSIONEX) 10-8 MG/5ML LQCR Take 5 mLs by mouth at bedtime as needed.  140 mL  0  . fluticasone (FLONASE) 50 MCG/ACT nasal spray Place 2 sprays into the nose daily.  16 g  2  . HYDROcodone-acetaminophen (NORCO/VICODIN) 5-325 MG per tablet Take 1-2 tablets by mouth every 4 (four) hours as needed for pain.  30 tablet  1  . lidocaine-prilocaine (EMLA) cream Apply topically as needed.  30 g  7  . naproxen sodium (ANAPROX) 220 MG tablet Take 220 mg by mouth 2 (two) times daily with a meal.      . omeprazole (PRILOSEC) 40 MG capsule TAKE 1 CAPSULE BY MOUTH DAILY  30 capsule  2   No current facility-administered  medications for this visit.   Facility-Administered Medications Ordered in Other Visits  Medication Dose Route Frequency Provider Last Rate Last Dose  . sodium chloride 0.9 % injection 10 mL  10 mL Intracatheter PRN Victorino December, MD   10 mL at 02/20/13 1215    SURGICAL HISTORY:  Past Surgical History  Procedure Laterality Date  . 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 Cesarean section  1995/1996  . Lobectomy  1997    upper left  . Portacath placement  07/02/2012    Procedure: INSERTION  PORT-A-CATH;  Surgeon: Mariella Saa, MD;  Location: [facility];  Service: General;  Laterality: N/A;  right  . Breast surgery  2006    cyst rt br-neg  . Breast lumpectomy with needle localization and axillary sentinel lymph node bx Left 12/31/2012    Procedure: NEEDLE LOCALIZATION LEFT BREAST LUMPECTOMY AND LEFT AXILLARY SENTENIAL LYMPH NODE BX;  Surgeon: Mariella Saa, MD;  Location: [facility];  Service: General;  Laterality: Left;  . Axillary lymph node dissection Left 02/08/2013    Procedure: LEFT AXILLARY DISSECTION;  Surgeon: Mariella Saa, MD;  Location: [facility];  Service: General;  Laterality: Left;    REVIEW OF SYSTEMS:   General: fatigue (-), night sweats (-), fever (-), pain (-) Lymph: palpable nodes (-) HEENT: vision changes (-), mucositis (-), gum bleeding (-), epistaxis (-) Cardiovascular: chest pain (-), palpitations (-) Pulmonary: shortness of breath (-), dyspnea on exertion (-), cough (-), hemoptysis (-) GI:  Early satiety (-), melena (-), dysphagia (-), nausea/vomiting (-), diarrhea (-) GU: dysuria (-), hematuria (-), incontinence (-) Musculoskeletal: joint swelling (-), joint pain (-), back pain (-) Neuro: weakness (-), numbness (-), headache (-), confusion (-) Skin: Rash (-), lesions (-), dryness (-) Psych: depression (-), suicidal/homicidal ideation (-), feeling of hopelessness (-)   PHYSICAL EXAMINATION: Blood pressure 108/71, pulse 73, temperature 98.3 F (36.8 C), temperature source Oral, resp. rate 18, height 5\' 2"  (1.575 m), weight 119 lb 3.2 oz (54.069 kg), SpO2 100.00%. Body mass index is 21.8 kg/(m^2). General: Patient is a well appearing female in no acute distress HEENT: PERRLA, sclerae anicteric no conjunctival pallor, MMM, ulceration in bilateral buccal mucosa, blistering in corners of mouth.   Neck: supple, no palpable adenopathy Lungs: mild wheezing in the left lung.  Right lung clear to  auscultation Cardiovascular: regular rate rhythm, S1, S2, no murmurs, rubs or gallops Abdomen: Soft, non-tender, non-distended, normoactive bowel sounds, no HSM Extremities: warm and well perfused, no clubbing, cyanosis, or edema Skin: No rashes or lesions Neuro: Non-focal Breasts: The right breast is unremarkable. Left breast lumpectomy site intact healing well with surgical glue in place, left axilla surgical site healing well, no erythema or exudate.   ECOG PERFORMANCE STATUS: 1  LABORATORY DATA: Lab Results  Component Value Date   WBC 8.7 02/20/2013   HGB 13.1 02/20/2013   HCT 38.4 02/20/2013   MCV 100.5 02/20/2013   PLT 235 02/20/2013      Chemistry      Component Value Date/Time   NA 141 02/20/2013 0920   NA 141 01/26/2011 1039   K 3.9 02/20/2013 0920   K 4.5 01/26/2011 1039   CL 100 12/20/2012 1509   CL 106 01/26/2011 1039   CO2 24 02/20/2013 0920   CO2 28 01/26/2011 1039   BUN 7.7 02/20/2013 0920   BUN 7 01/26/2011 1039   CREATININE 0.8 02/20/2013  0920   CREATININE 0.6 01/26/2011 1039      Component Value Date/Time   CALCIUM 9.7 02/20/2013 0920   CALCIUM 9.2 01/26/2011 1039   ALKPHOS 72 02/20/2013 0920   AST 16 02/20/2013 0920   ALT 11 02/20/2013 0920   BILITOT 0.26 02/20/2013 0920       RADIOGRAPHIC STUDIES: Dg Chest 2 View  07/30/2012  *RADIOLOGY REPORT*  Clinical Data: Bronchiectasis.  History of breast cancer.  CHEST - 2 VIEW  Comparison: 07/02/2012 and 05/03/2011  Findings: Power port in place.  There is now an ill-defined 16 mm in density in the left lung base which correlates with the previous cystic lesion seen on CT scan of 01/26/2011.  There is central lucency.  I suspect this represents retraction and wall thickening of the previously seen area of cystic bronchiectasis.  Scarring at the left apex medially due to cystic bronchiectasis appears slightly less prominent.  The right lung is clear.  Heart size is normal.  Previous left upper lobectomy.  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  No other change since the prior exam.   Original Report Authenticated By: Francene Boyers, M.D.      ASSESSMENT: 47 year old [place] woman  with  #1 a clinical stage IIA (3.1 cm) invasive ductal carcinoma of the left breast status post needle core biopsy 06/05/2012 that showed ER positive PR positive HER-2/neu positive breast cancer with a Ki-67 at 70%.   #2 started chemotherapy on 07/13/2011 consisting of carboplatin, docetaxel and trastuzumab every 3 weeks, with the trastuzumab given weekly  #3 trastuzumab every 3 weeks to be continued to complete a year;  most recent Echo 11/19/12, she is followed by Dr. Gala Romney and he has her scheduled for f/u echo and evaluation on 03/18/13.    #4 Lumpectomy and Sentinel node biopsy on 12/31/12, sentinel nodes were positive.  She underwent ALND on 02/08/13 and 0/13 lymph nodes removed were positive.    PLAN:   1. Melissa [name] is feeling well today. She will proceed with herceptin today.  She will also receive Ativan in the treatment room to help alleviate the anxiety associated with her drain being pulled.    2. She will return in 3 weeks for Herceptin.  I requested her September and October appointments.    All questions were answered. The patient knows to call the clinic with any problems, questions or concerns. We can certainly see the patient much sooner if necessary.   I spent 25 minutes counseling the patient face to face. The total time spent in the appointment was 30 minutes.  Cherie Ouch Lyn Hollingshead, NP Medical Oncology [facility] Phone: [phone] 02/20/2013, 12:44 PM

## 2013-02-21 ENCOUNTER — Encounter (HOSPITAL_COMMUNITY): Payer: BC Managed Care – PPO

## 2013-02-21 ENCOUNTER — Ambulatory Visit (HOSPITAL_COMMUNITY): Payer: 59

## 2013-02-21 ENCOUNTER — Telehealth: Payer: Self-pay | Admitting: Oncology

## 2013-02-26 ENCOUNTER — Other Ambulatory Visit (INDEPENDENT_AMBULATORY_CARE_PROVIDER_SITE_OTHER): Payer: Self-pay

## 2013-02-26 MED ORDER — HYDROCODONE-ACETAMINOPHEN 5-325 MG PO TABS: 1.0000 | ORAL_TABLET | ORAL | Status: DC | PRN

## 2013-02-26 NOTE — Progress Notes (Signed)
Patient calling in for refill on Hydrocodone 5/325mg , #30.  Patient s/p Lt axillary dissection on 02/05/2013.  Reviewed patient chart and patient has not rec'd pain medication refill protocol.  Pain medication protocol called in to [facility] in [place], [place] on [address].  Patient rates her pain on pain scale 5-6.  Patient denies any having any fevers, redness or swelling at the surgical site.  Patient just reports having post surgical discomfort. Patient has post op appt on 03/01/13 @ 4:30pm w/Dr. Johna Sheriff.

## 2013-02-27 ENCOUNTER — Telehealth (INDEPENDENT_AMBULATORY_CARE_PROVIDER_SITE_OTHER): Payer: Self-pay | Admitting: General Surgery

## 2013-02-27 NOTE — Telephone Encounter (Signed)
Pt called to check on status of refill request for Norco; found it in med list, but was not called to pharmacy.  Per protocol, one automatic refill of pain medication was called to [facility]:  [phone] for Hydrocodone 5/325 mg, # 30, 1-2 po Q4-6H prn pain no refill.  Pt aware.

## 2013-03-01 ENCOUNTER — Encounter (INDEPENDENT_AMBULATORY_CARE_PROVIDER_SITE_OTHER): Payer: Self-pay | Admitting: General Surgery

## 2013-03-01 ENCOUNTER — Ambulatory Visit (INDEPENDENT_AMBULATORY_CARE_PROVIDER_SITE_OTHER): Payer: 59 | Admitting: General Surgery

## 2013-03-01 VITALS — BP 102/62 | HR 76 | Resp 14 | Ht 62.0 in | Wt 119.8 lb

## 2013-03-01 DIAGNOSIS — C50219 Malignant neoplasm of upper-inner quadrant of unspecified female breast: Secondary | ICD-10-CM

## 2013-03-01 DIAGNOSIS — C50212 Malignant neoplasm of upper-inner quadrant of left female breast: Secondary | ICD-10-CM

## 2013-03-01 NOTE — Patient Instructions (Signed)
Start her arm range of motion exercises twice daily. Call if you do not have a firm physical therapy appointment within the next week. Call as needed for any other concerns.

## 2013-03-01 NOTE — Progress Notes (Signed)
History: This returns for further followup after left axillary dissection for positive sentinel lymph node biopsy status post neoadjuvant chemotherapy lumpectomy and sentinel lymph node biopsy. She still has some discomfort when raising her arm. No swelling.  Exam: BP 102/62  Pulse 76  Resp 14  Ht 5\' 2"  (1.575 m)  Wt 119 lb 12.8 oz (54.341 kg)  BMI 21.91 kg/m2 General: Appears well Incisions: Axillary incision well-healed without fluid collection. Breast incision well-healed. Extremities: No swelling. She however has extension of her left arm only to about horizontal.  Assessment and plan: Status post left axillary dissection. She is in significant mobility issues and needs physical therapy. This was discussed at the cancer Center but we will try followup and make sure she has a physical therapy appointment that she is given instructions regarding arm range of motion exercises today. I will see her back in 2 months.

## 2013-03-01 NOTE — Progress Notes (Signed)
Location of Breast Cancer:Left Breast  Mass 12 0' clock ,4cm nipple  Histology per Pathology Report: 06/05/12: Breast, left, needle core biopsy, 12 o'clock, 4 cm / nipple- INVASIVE DUCTAL CARCINOMA.- DUCTAL CARCINOMA IN SITU.  Receptor Status: ER(+), PR (+), Her2-neu (+)  Did patient present with symptoms (if so, please note symptoms) or was this found on screening mammography?: Patient found  Mass superior aspect left breast November 2013 Past/Anticipated interventions by surgeon, if any:02/08/13:Lymph nodes, regional resection, Left axillary - THIRTEEN LYMPH NODES, NEGATIVE FOR TUMOR (0/13), Dr.Benjamin Hoxworth  12/31/12:1. Breast, lumpectomy, Left- INVASIVE DUCTAL CARCINOMA.- HIGH GRADE DUCTAL CARCINOMA IN SITU.- MARGINS NOT INVOLVED.- CLOSEST MARGINS MEDIAL AND LATERAL AT 0.1 CM. - FIBROCYSTIC CHANGES WITH CALCIFICATIONS.2. Lymph node, sentinel, biopsy, Left axillary #1- METASTATIC CARCINOMA IN ONE LYMPH NODE. Dr. Glenna Fellows Port acath placement  07/02/12 Dr. Glenna Fellows  Past/Anticipated interventions by medical oncology, if any: Chemotherapy Neoadjuvant chemotherapy , Taxotere, Carboplatin, ,started 07/12/12;, completed 6 cycles ; Hercepetin every 3 weeks,eventually need to start antiestrogen therapy /premenopausal=tamoxifen, Lymphedema issues, if any:  no Pain issues, if any:  Soreness left breast SAFETY ISSUES:  Prior radiation? no  Pacemaker/ICD? no  Possible current pregnancy?no  Is the patient on methotrexate? no  Current Complaints / other details:  Married, 2 children,   seen multidisciplinary Breast Clinic 06/13/12  HX LUL Lobectomy11/97, After  [place] FEVER INFECTION Asthma, smoker 0.5ppd x 24 years, , Married, 2 daughters, menses age 15, 1st live birth 20, menopause s/p chemotherapy, no HRT  Mother skin cancer/ heart disease,emphysema,also smoker Maternal  2nd cousin dx breast cancer,double mastectomies > 20 years ago,  Allergies:IV  dye-=hives/itching,ASA,    Katherene Ponto, Gloriann Loan, RN 03/01/2013,1:38 PM

## 2013-03-05 ENCOUNTER — Telehealth: Payer: Self-pay | Admitting: *Deleted

## 2013-03-05 ENCOUNTER — Encounter: Payer: Self-pay | Admitting: Radiation Oncology

## 2013-03-05 ENCOUNTER — Ambulatory Visit
Admission: RE | Admit: 2013-03-05 | Discharge: 2013-03-05 | Disposition: A | Discharge status: Home / Self Care | Payer: 59 | Source: Ambulatory Visit | Attending: Radiation Oncology | Admitting: Radiation Oncology

## 2013-03-05 VITALS — BP 94/63 | HR 69 | Temp 98.3°F | Resp 20 | Ht 62.0 in | Wt 120.3 lb

## 2013-03-05 DIAGNOSIS — C50212 Malignant neoplasm of upper-inner quadrant of left female breast: Secondary | ICD-10-CM

## 2013-03-05 DIAGNOSIS — Z79899 Other long term (current) drug therapy: Secondary | ICD-10-CM | POA: Insufficient documentation

## 2013-03-05 DIAGNOSIS — C50919 Malignant neoplasm of unspecified site of unspecified female breast: Secondary | ICD-10-CM | POA: Insufficient documentation

## 2013-03-05 HISTORY — DX: Anemia, unspecified: D64.9

## 2013-03-05 NOTE — Telephone Encounter (Signed)
Called patient and left message for patient her appt is this Friday 03/08/13 to be there at 7:45am for 8am appt, can call me back for any questions 9:00 AM

## 2013-03-05 NOTE — Progress Notes (Signed)
Please see the Nurse Progress Note in the MD Initial Consult Encounter for this patient. 

## 2013-03-05 NOTE — Progress Notes (Addendum)
CC: Dr. Sigmund Hazel, Dr. Jaclynn Guarneri, Dr. Drue Second  Followup note:  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 She noted a mass along the superior aspect of her left breast just over a month ago. She underwent diagnostic mammography on 06/05/2012 which showed an irregular mass with suspicious microcalcifications in the upper portion of the left breast. A 3 cm mass could be palpated at 12:00, 4 cm from the nipple. On ultrasound the mass with calcifications measured 2.5 x 2.6 x 1.5 cm. Ultrasound of the left axilla showed a prominent but not definitely abnormal axillary lymph node. An ultrasound-guided biopsy on 06/05/2012 was diagnostic for invasive ductal/DCIS of the breast. Her tumor was triple positive with an elevated Ki-67 of 70%. The tumor was high-grade. Breast MR showed a 3.1 x 2.4 x 1.7 mass at 12:00. There were no additional enhancing foci and there is no evidence for axillary adenopathy. She underwent neoadjuvant chemotherapy with Dr. Welton Flakes with interested in breast preservation. She a total of 6 cycles of Taxotere/carboplatin with a favorable response by MRI on 10/15/2012. The mass shrunk from 3.1 cm to 1.2 cm. She also received Herceptin. On 12/31/2012 she underwent a left partial mastectomy with a residual 1.8 cm invasive ductal carcinoma. Invasive disease was seen 0.1 cm from the medial and lateral margins and in situ carcinoma was seen 0.2 cm from the lateral margin. A single sentinel lymph nodes contain metastatic disease. She'll onto receive a completion left axillary node dissection with Dr. Johna Sheriff on 02/08/2013. The remaining 13 lymph nodes were free of metastatic disease. She continues with Herceptin and will receive adjuvant hormone therapy following completion of radiation therapy. She continues  to smoke.  Physical examination: Alert and oriented. Filed Vitals:   03/05/13 0746  BP: 94/63  Pulse: 69  Temp: 98.3 F (36.8 C)  Resp: 20   Head and neck examination: Grossly unremarkable with some regrowth of hair. Nodes: Without palpable cervical, supraclavicular, or axillary lymphadenopathy. Her left axillary wound is healing well. Chest: Scattered rhonchi bilaterally. Heart: Regular rate rhythm. Breasts: There is a partial mastectomy wound along the spear aspect of the left breast at 12:00 in this is healing well. No masses are appreciated. Right breast without masses or lesions. Abdomen without hepatomegaly. Extremities: Without edema. Neurologic examination: Grossly nonfocal.  Laboratory data: Lab Results  Component Value Date   WBC 8.7 02/20/2013   HGB 13.1 02/20/2013   HCT 38.4 02/20/2013   MCV 100.5 02/20/2013   PLT 235 02/20/2013     Impression: Pathologic stage II A (ypT1 ypN1a M0) invasive ductal/DCIS of the left breast. We discussed local treatment options, and she desires to proceed with breast preservation. Based on her age, and tumor grade, feel that she should undergo standard fractionation radiation therapy. We discussed the potential acute and late toxicities of radiation therapy. Consent is signed today. I will like to obtain a baseline mammogram at the Breast Center as soon as possible to confirm removal of all suspicious microcalcifications. We'll then have her return later this week or next week for simulation/treatment planning. We also discussed the need for her to stop smoking, and she was offered counseling.  Plan: Baseline left breast mammogram, then proceed with simulation/treatment planning. For her radiation therapy she'll be offered adjuvant hormone therapy along with continuation of  Herceptin.  30 minutes was face-to-face with the patient, primarily counseling the patient and coordinating her care.

## 2013-03-05 NOTE — Telephone Encounter (Addendum)
Called the [facility],  [phone]; spoke with Victorino Dike, asked that patient  Needs a  mammogram asap before we can start radiation  For patient,"  appt for patient  has to be worked in  As there isn't any appts soon available " appt for patient is this Friday  03/08/13 at 8 am,patient to be there at 7:45 am, thanked Victorino Dike ,will call pateint, called and left message for patient to arrive at [facility] [place] on Friday 03/08/13 at 7:45am for 8am appt,can call for any questions 9:01 AM

## 2013-03-08 ENCOUNTER — Ambulatory Visit
Admission: RE | Admit: 2013-03-08 | Discharge: 2013-03-08 | Disposition: A | Discharge status: Home / Self Care | Payer: 59 | Source: Ambulatory Visit | Attending: Radiation Oncology | Admitting: Radiation Oncology

## 2013-03-08 DIAGNOSIS — C50212 Malignant neoplasm of upper-inner quadrant of left female breast: Secondary | ICD-10-CM

## 2013-03-13 ENCOUNTER — Other Ambulatory Visit (HOSPITAL_BASED_OUTPATIENT_CLINIC_OR_DEPARTMENT_OTHER): Payer: 59 | Admitting: Lab

## 2013-03-13 ENCOUNTER — Ambulatory Visit (HOSPITAL_BASED_OUTPATIENT_CLINIC_OR_DEPARTMENT_OTHER): Payer: 59 | Admitting: Adult Health

## 2013-03-13 ENCOUNTER — Encounter: Payer: Self-pay | Admitting: Adult Health

## 2013-03-13 ENCOUNTER — Encounter: Payer: Self-pay | Admitting: Oncology

## 2013-03-13 ENCOUNTER — Ambulatory Visit (HOSPITAL_BASED_OUTPATIENT_CLINIC_OR_DEPARTMENT_OTHER): Payer: 59

## 2013-03-13 ENCOUNTER — Ambulatory Visit: Payer: 59 | Attending: Oncology | Admitting: Physical Therapy

## 2013-03-13 VITALS — BP 109/73 | HR 59 | Temp 98.3°F | Resp 20 | Ht 62.0 in | Wt 118.8 lb

## 2013-03-13 DIAGNOSIS — C50219 Malignant neoplasm of upper-inner quadrant of unspecified female breast: Secondary | ICD-10-CM

## 2013-03-13 DIAGNOSIS — C50919 Malignant neoplasm of unspecified site of unspecified female breast: Secondary | ICD-10-CM | POA: Insufficient documentation

## 2013-03-13 DIAGNOSIS — C50212 Malignant neoplasm of upper-inner quadrant of left female breast: Secondary | ICD-10-CM

## 2013-03-13 DIAGNOSIS — IMO0001 Reserved for inherently not codable concepts without codable children: Secondary | ICD-10-CM | POA: Insufficient documentation

## 2013-03-13 DIAGNOSIS — Z5112 Encounter for antineoplastic immunotherapy: Secondary | ICD-10-CM

## 2013-03-13 DIAGNOSIS — M25519 Pain in unspecified shoulder: Secondary | ICD-10-CM | POA: Insufficient documentation

## 2013-03-13 DIAGNOSIS — M24519 Contracture, unspecified shoulder: Secondary | ICD-10-CM | POA: Insufficient documentation

## 2013-03-13 DIAGNOSIS — Z170 Estrogen receptor positive status [ER+]: Secondary | ICD-10-CM

## 2013-03-13 LAB — CBC WITH DIFFERENTIAL/PLATELET
BASO%: 0.5 % (ref 0.0–2.0)
Basophils Absolute: 0 10e3/uL (ref 0.0–0.1)
EOS%: 8.4 % — ABNORMAL HIGH (ref 0.0–7.0)
Eosinophils Absolute: 0.6 10e3/uL — ABNORMAL HIGH (ref 0.0–0.5)
HCT: 38.1 % (ref 34.8–46.6)
HGB: 13.1 g/dL (ref 11.6–15.9)
LYMPH%: 32 % (ref 14.0–49.7)
MCH: 33.7 pg (ref 25.1–34.0)
MCHC: 34.4 g/dL (ref 31.5–36.0)
MCV: 97.9 fL (ref 79.5–101.0)
MONO#: 0.5 10e3/uL (ref 0.1–0.9)
MONO%: 7.1 % (ref 0.0–14.0)
NEUT#: 3.8 10e3/uL (ref 1.5–6.5)
NEUT%: 52 % (ref 38.4–76.8)
Platelets: 214 10e3/uL (ref 145–400)
RBC: 3.89 10e6/uL (ref 3.70–5.45)
RDW: 13.6 % (ref 11.2–14.5)
WBC: 7.4 10e3/uL (ref 3.9–10.3)
lymph#: 2.4 10e3/uL (ref 0.9–3.3)
nRBC: 0 % (ref 0–0)

## 2013-03-13 LAB — COMPREHENSIVE METABOLIC PANEL (CC13)
Albumin: 3.6 g/dL (ref 3.5–5.0)
Alkaline Phosphatase: 76 U/L (ref 40–150)
BUN: 8.3 mg/dL (ref 7.0–26.0)
CO2: 24 mEq/L (ref 22–29)
Glucose: 83 mg/dl (ref 70–140)
Potassium: 3.9 mEq/L (ref 3.5–5.1)
Sodium: 140 mEq/L (ref 136–145)
Total Protein: 6.9 g/dL (ref 6.4–8.3)

## 2013-03-13 MED ORDER — TRASTUZUMAB CHEMO INJECTION 440 MG
6.0000 mg/kg | Freq: Once | INTRAVENOUS | Status: AC
  Administered 2013-03-13: 336 mg via INTRAVENOUS
  Filled 2013-03-13: qty 16

## 2013-03-13 MED ORDER — SODIUM CHLORIDE 0.9 % IJ SOLN
10.0000 mL | INTRAMUSCULAR | Status: DC | PRN
  Administered 2013-03-13: 10 mL
  Filled 2013-03-13: qty 10

## 2013-03-13 MED ORDER — DIPHENHYDRAMINE HCL 25 MG PO CAPS
50.0000 mg | ORAL_CAPSULE | Freq: Once | ORAL | Status: AC
  Administered 2013-03-13: 25 mg via ORAL

## 2013-03-13 MED ORDER — HEPARIN SOD (PORK) LOCK FLUSH 100 UNIT/ML IV SOLN
500.0000 [IU] | Freq: Once | INTRAVENOUS | Status: AC | PRN
  Administered 2013-03-13: 500 [IU]
  Filled 2013-03-13: qty 5

## 2013-03-13 MED ORDER — SODIUM CHLORIDE 0.9 % IV SOLN
Freq: Once | INTRAVENOUS | Status: AC
  Administered 2013-03-13: 11:00:00 via INTRAVENOUS

## 2013-03-13 MED ORDER — ACETAMINOPHEN 325 MG PO TABS
650.0000 mg | ORAL_TABLET | Freq: Once | ORAL | Status: AC
  Administered 2013-03-13: 650 mg via ORAL

## 2013-03-13 NOTE — Progress Notes (Signed)
OFFICE PROGRESS NOTE  CC  PCP: [name], MD   [name], MD 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  PRIOR THERAPY:  #1 patient was originally seen in the multidisciplinary breast clinic when she found a left breast mass she eventually had MRI and mammograms performed. She was diagnosed with invasive ductal carcinoma that was ER positive PR positive HER-2/neu positive measuring 3.1 cm by MRI criteria. Ki-67 was 70% HER-2 was amplified with a ratio 2.91.  #2 patient was seen at the multidisciplinary breast clinic and a recommendation for neoadjuvant chemotherapy was recommended as patient was interested in breast conservation. She will receive neoadjuvant Taxotere carboplatinum Herceptin. Taxotere carboplatinum will be given every 21 days with weekly Herceptin. Total of 6 cycles of Taxotere and carboplatinum will be administered.  This was started 07/12/12.  3 patient had MRI of the breasts performed on 10/15/2012 which does show reduction in the size of the left breast mass from 3.1to 1.2. This is a wonderful response.  4. Patient begun on every 3 week herceptin beginning 11/29/12.  5. Patient underwent lumpectomy on 12/31/12 a 1.8 cm tumor was removed, the margins were 0.1cm and 1/1 sentinel nodes were positive disease.  A re-excision and axillary node dissection was recommended by Dr. Welton Flakes. An ALND occurred on 02/08/13 and 0/13 lymph nodes were positive for metastases.    CURRENT THERAPY:   Herceptin every 3 weeks  INTERVAL HISTORY:  Patient returns in followup visit prior to every 3 week Herceptin.  She's doing well today.  She denies shortness of breath, DOE, palpitations, PND, orthopnea or any other concerns.  She will be simulated for radiation tomorrow, and has an appt with Dr. Gala Romney on  03/18/13 for cardiac evaluation and echocardiogram.  A 10 point ROS is neg.    Past Medical History  Diagnosis Date  . Breast cancer   . Headache(784.0)   . Back pain   . 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 [place] fever   . Wears dentures     top  . Allergy   . Anemia   . Blood transfusion without reported diagnosis     2x s/p chemotherrapy     ALLERGIES:  is allergic to aspirin and iodinated diagnostic agents.  MEDICATIONS:  Current Outpatient Prescriptions  Medication Sig Dispense Refill  . albuterol (VENTOLIN HFA) 108 (90 BASE) MCG/ACT inhaler Inhale 2 puffs into the lungs every 6 (six) hours as needed.  1 Inhaler  3  . cetirizine (ZYRTEC) 10 MG tablet Take 10 mg by mouth daily as needed.       . fluticasone (FLONASE) 50 MCG/ACT nasal spray Place 2 sprays into the nose daily as needed.      . lidocaine-prilocaine (EMLA) cream Apply topically as needed.  30 g  7  . naproxen sodium (ANAPROX) 220 MG tablet Take 220 mg by mouth 2 (two) times daily with a meal.      . omeprazole (PRILOSEC) 40 MG capsule TAKE 1 CAPSULE BY MOUTH DAILY  30 capsule  2  . HYDROcodone-acetaminophen (NORCO/VICODIN) 5-325 MG per tablet Take 1-2 tablets by mouth every 4 (four) hours as needed for pain.  30 tablet  0   No current facility-administered medications for this visit.    SURGICAL HISTORY:  Past Surgical History  Procedure Laterality Date  . 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 Cesarean section  1995/1996  . Lobectomy  1997    upper left  . Portacath placement  07/02/2012    Procedure: INSERTION PORT-A-CATH;  Surgeon: Mariella Saa, MD;  Location: [facility];  Service: General;  Laterality: N/A;  right  . Breast surgery  2006    cyst rt br-neg  . Breast lumpectomy with needle localization and axillary sentinel lymph node bx Left 12/31/2012    Procedure: NEEDLE LOCALIZATION LEFT BREAST  LUMPECTOMY AND LEFT AXILLARY SENTENIAL LYMPH NODE BX;  Surgeon: Mariella Saa, MD;  Location: [facility];  Service: General;  Laterality: Left;  . Axillary lymph node dissection Left 02/08/2013    Procedure: LEFT AXILLARY DISSECTION;  Surgeon: Mariella Saa, MD;  Location: [facility];  Service: General;  Laterality: Left;    REVIEW OF SYSTEMS:   General: fatigue (-), night sweats (-), fever (-), pain (-) Lymph: palpable nodes (-) HEENT: vision changes (-), mucositis (-), gum bleeding (-), epistaxis (-) Cardiovascular: chest pain (-), palpitations (-) Pulmonary: shortness of breath (-), dyspnea on exertion (-), cough (-), hemoptysis (-) GI:  Early satiety (-), melena (-), dysphagia (-), nausea/vomiting (-), diarrhea (-) GU: dysuria (-), hematuria (-), incontinence (-) Musculoskeletal: joint swelling (-), joint pain (-), back pain (-) Neuro: weakness (-), numbness (-), headache (-), confusion (-) Skin: Rash (-), lesions (-), dryness (-) Psych: depression (-), suicidal/homicidal ideation (-), feeling of hopelessness (-)   PHYSICAL EXAMINATION: Blood pressure 109/73, pulse 59, temperature 98.3 F (36.8 C), temperature source Oral, resp. rate 20, height 5\' 2"  (1.575 m), weight 118 lb 12.8 oz (53.887 kg). Body mass index is 21.72 kg/(m^2). General: Patient is a well appearing female in no acute distress HEENT: PERRLA, sclerae anicteric no conjunctival pallor, MMM, ulceration in bilateral buccal mucosa, blistering in corners of mouth.   Neck: supple, no palpable adenopathy Lungs: mild wheezing in the left lung.  Right lung clear to auscultation Cardiovascular: regular rate rhythm, S1, S2, no murmurs, rubs or gallops Abdomen: Soft, non-tender, non-distended, normoactive bowel sounds, no HSM Extremities: warm and well perfused, no clubbing, cyanosis, or edema Skin: No rashes or lesions Neuro: Non-focal Breasts: The right breast is unremarkable. Left  breast lumpectomy site intact healing well, left axilla surgical site healing well, no erythema or exudate.   ECOG PERFORMANCE STATUS: 1  LABORATORY DATA: Lab Results  Component Value Date   WBC 7.4 03/13/2013   HGB 13.1 03/13/2013   HCT 38.1 03/13/2013   MCV 97.9 03/13/2013   PLT 214 03/13/2013      Chemistry      Component Value Date/Time   NA 140 03/13/2013 0910   NA 141 01/26/2011 1039   K 3.9 03/13/2013 0910   K 4.5 01/26/2011 1039   CL 100 12/20/2012 1509   CL 106 01/26/2011 1039   CO2 24 03/13/2013 0910   CO2 28 01/26/2011 1039   BUN 8.3 03/13/2013 0910   BUN 7 01/26/2011 1039   CREATININE 0.8 03/13/2013 0910   CREATININE 0.6 01/26/2011 1039      Component Value Date/Time   CALCIUM 9.7 03/13/2013 0910   CALCIUM 9.2 01/26/2011 1039   ALKPHOS 76 03/13/2013 0910   AST 16 03/13/2013 0910   ALT 15 03/13/2013 0910   BILITOT 0.26 03/13/2013 0910       RADIOGRAPHIC STUDIES: Dg  Chest 2 View  07/30/2012  *RADIOLOGY REPORT*  Clinical Data: Bronchiectasis.  History of breast cancer.  CHEST - 2 VIEW  Comparison: 07/02/2012 and 05/03/2011  Findings: Power port in place.  There is now an ill-defined 16 mm in density in the left lung base which correlates with the previous cystic lesion seen on CT scan of 01/26/2011.  There is central lucency.  I suspect this represents retraction and wall thickening of the previously seen area of cystic bronchiectasis.  Scarring at the left apex medially due to cystic bronchiectasis appears slightly less prominent.  The right lung is clear.  Heart size is normal.  Previous left upper lobectomy.  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  No other change since the prior exam.   Original Report Authenticated By: Francene Boyers, M.D.      ASSESSMENT: 47 year old [place] woman  with  #1 a clinical stage IIA (3.1 cm) invasive ductal carcinoma of the left breast status post needle core biopsy 06/05/2012 that showed ER  positive PR positive HER-2/neu positive breast cancer with a Ki-67 at 70%.   #2 started chemotherapy on 07/13/2011 consisting of carboplatin, docetaxel and trastuzumab every 3 weeks, with the trastuzumab given weekly  #3 trastuzumab every 3 weeks to be continued to complete a year;  most recent Echo 11/19/12, she is followed by Dr. Gala Romney and he has her scheduled for f/u echo and evaluation on 03/18/13.    #4 Lumpectomy and Sentinel node biopsy on 12/31/12, sentinel nodes were positive.  She underwent ALND on 02/08/13 and 0/13 lymph nodes removed were positive.    PLAN:   1. Ms. Yono is feeling well today. She will proceed with herceptin today.  She will  F/u with radiation oncology and cardiology within the next week.    2. She will return in 3 weeks for Herceptin.    All questions were answered. The patient knows to call the clinic with any problems, questions or concerns. We can certainly see the patient much sooner if necessary.   I spent 25 minutes counseling the patient face to face. The total time spent in the appointment was 30 minutes.  Cherie Ouch Lyn Hollingshead, NP Medical Oncology [facility] Phone: [phone] 03/14/2013, 10:11 AM

## 2013-03-13 NOTE — Patient Instructions (Addendum)
[facility] Discharge Instructions for Patients Receiving Chemotherapy  Today you received the following chemotherapy agents:  Herceptin  To help prevent nausea and vomiting after your treatment, we encourage you to take your nausea medication as ordered per MD.   If you develop nausea and vomiting that is not controlled by your nausea medication, call the clinic.   BELOW ARE SYMPTOMS THAT SHOULD BE REPORTED IMMEDIATELY:  *FEVER GREATER THAN 100.5 F  *CHILLS WITH OR WITHOUT FEVER  NAUSEA AND VOMITING THAT IS NOT CONTROLLED WITH YOUR NAUSEA MEDICATION  *UNUSUAL SHORTNESS OF BREATH  *UNUSUAL BRUISING OR BLEEDING  TENDERNESS IN MOUTH AND THROAT WITH OR WITHOUT PRESENCE OF ULCERS  *URINARY PROBLEMS  *BOWEL PROBLEMS  UNUSUAL RASH Items with * indicate a potential emergency and should be followed up as soon as possible.  Feel free to call the clinic you have any questions or concerns. The clinic phone number is [phone].    

## 2013-03-13 NOTE — Patient Instructions (Signed)
Doing well.  Proceed with Herceptin.  Please call us if you have any questions or concerns.    

## 2013-03-14 ENCOUNTER — Ambulatory Visit
Admission: RE | Admit: 2013-03-14 | Discharge: 2013-03-14 | Disposition: A | Discharge status: Home / Self Care | Payer: 59 | Source: Ambulatory Visit | Attending: Radiation Oncology | Admitting: Radiation Oncology

## 2013-03-14 DIAGNOSIS — Z510 Encounter for antineoplastic radiation therapy: Secondary | ICD-10-CM | POA: Insufficient documentation

## 2013-03-14 DIAGNOSIS — C50919 Malignant neoplasm of unspecified site of unspecified female breast: Secondary | ICD-10-CM | POA: Diagnosis not present

## 2013-03-14 DIAGNOSIS — C50212 Malignant neoplasm of upper-inner quadrant of left female breast: Secondary | ICD-10-CM

## 2013-03-14 NOTE — Progress Notes (Signed)
Complex simulation/treatment planning note: The patient was taken to the CT simulator. She was placed on a custom breast board and a custom Acuform neck mold was constructed for immobilization. Her left breast borders were marked with radiopaque wires along with a partial mastectomy scar. She was then scanned free breathing. It was determined that the tangential fields will encompass her heart, and she was rescanned with deep inspiration/breath-hold. There appeared to be a benefit. I contoured her tumor bed on the free breathing and deep inspiration breath-hold scans. She was set up to tangential fields. She is now ready for 3-D simulation. I prescribing 5000 cGy 25 sessions then boosting her tumor bed by further 1000 cGy in 5 sessions with electrons.

## 2013-03-15 ENCOUNTER — Telehealth: Payer: Self-pay | Admitting: Oncology

## 2013-03-15 ENCOUNTER — Encounter: Payer: Self-pay | Admitting: Radiation Oncology

## 2013-03-15 ENCOUNTER — Telehealth: Payer: Self-pay | Admitting: *Deleted

## 2013-03-15 DIAGNOSIS — Z510 Encounter for antineoplastic radiation therapy: Secondary | ICD-10-CM | POA: Diagnosis not present

## 2013-03-15 NOTE — Progress Notes (Signed)
  Radiation Oncology         [phone] ________________________________  Name: Melissa [name] MRN: [id number]  Date: 03/15/2013  DOB: [birth year]  RESPIRATORY MOTION MANAGEMENT SIMULATION  NARRATIVE:  In order to account for effect of respiratory motion on target structures and other organs in the planning and delivery of radiotherapy, this patient underwent respiratory motion management simulation.  To accomplish this, when the patient was brought to the CT simulation planning suite, 4D respiratoy motion management CT images were obtained.  The CT images were loaded into the planning software.  Then, using a variety of tools including Cine, MIP, and standard views, the target volume and planning target volumes (PTV) were delineated.  Avoidance structures were contoured.  Treatment planning then occurred.  Dose volume histograms were generated and reviewed for each of the requested structure.  The resulting plan was carefully reviewed and approved today.  3-D simulation note: The patient completed 3-D simulation today for deep inspiration/breath-hold tangential treatment to her left breast. Dose volume histograms were obtained for the heart, lungs, spinal cord, and target structures. We met the departmental guidelines. 2 sets of multileaf collimators are designed to conform the field. I prescribing 5000 cGy 5 sessions utilizing 6 MV photons.

## 2013-03-15 NOTE — Telephone Encounter (Signed)
Per staff message and POF I have scheduled appts.  JMW  

## 2013-03-18 ENCOUNTER — Ambulatory Visit (HOSPITAL_COMMUNITY)
Admission: RE | Admit: 2013-03-18 | Discharge: 2013-03-18 | Disposition: A | Discharge status: Home / Self Care | Payer: 59 | Source: Ambulatory Visit | Attending: Internal Medicine | Admitting: Internal Medicine

## 2013-03-18 ENCOUNTER — Encounter (HOSPITAL_COMMUNITY): Payer: Self-pay

## 2013-03-18 ENCOUNTER — Ambulatory Visit (HOSPITAL_BASED_OUTPATIENT_CLINIC_OR_DEPARTMENT_OTHER)
Admission: RE | Admit: 2013-03-18 | Discharge: 2013-03-18 | Disposition: A | Discharge status: Home / Self Care | Payer: 59 | Source: Ambulatory Visit | Attending: Internal Medicine | Admitting: Internal Medicine

## 2013-03-18 VITALS — BP 106/74 | HR 65 | Wt 120.1 lb

## 2013-03-18 DIAGNOSIS — F17200 Nicotine dependence, unspecified, uncomplicated: Secondary | ICD-10-CM | POA: Insufficient documentation

## 2013-03-18 DIAGNOSIS — Z8619 Personal history of other infectious and parasitic diseases: Secondary | ICD-10-CM | POA: Insufficient documentation

## 2013-03-18 DIAGNOSIS — Z5111 Encounter for antineoplastic chemotherapy: Secondary | ICD-10-CM

## 2013-03-18 DIAGNOSIS — C50219 Malignant neoplasm of upper-inner quadrant of unspecified female breast: Secondary | ICD-10-CM | POA: Insufficient documentation

## 2013-03-18 DIAGNOSIS — C50212 Malignant neoplasm of upper-inner quadrant of left female breast: Secondary | ICD-10-CM

## 2013-03-18 DIAGNOSIS — Z79899 Other long term (current) drug therapy: Secondary | ICD-10-CM | POA: Insufficient documentation

## 2013-03-18 DIAGNOSIS — D649 Anemia, unspecified: Secondary | ICD-10-CM | POA: Insufficient documentation

## 2013-03-18 DIAGNOSIS — J45909 Unspecified asthma, uncomplicated: Secondary | ICD-10-CM | POA: Insufficient documentation

## 2013-03-18 DIAGNOSIS — Z902 Acquired absence of lung [part of]: Secondary | ICD-10-CM | POA: Insufficient documentation

## 2013-03-18 NOTE — Patient Instructions (Addendum)
Follow up in 3 months with ECHO and Dr Gala Romney

## 2013-03-18 NOTE — Progress Notes (Addendum)
Patient ID: Melissa [name], female   DOB: 11/19/1965, 47 y.o.   MRN: [id number] Referring Physician: Dr. Welton Flakes Primary Care:  Primary Cardiologist: Oncologist: Dr Welton Flakes Pulmonologist: Dr. Sherene Sires  General Surgeon  HPI: Melissa [name] is a 47 y.o. 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  S/P 12/31/12 L lumpectomy. Current smoker. 02/2013 L axillary node dissection  She received neoadjuvant Taxotere carboplatinum Herceptin. Taxotere carboplatinum will be given every 21 days with weekly Herceptin. Total of 6 cycles of Taxotere and carboplatinum will be administered. (07/12/12) Plan to continue Herceptin for 1 year. Will finish in January 2015.   Echo:   06/25/12:  LVEF 55-60%,  lat s' 11.3 09/20/12 EF 60-65%   lat s' 11.4 11/19/12 EF  60%   Lateral S' 10.5 03/18/13 EF  60    Lateral s' 10.5  Global strain  -17.7    She returns for follow up. Complaining of L arm discomfort. Followed by PT. She will start radiation next week. Denies SOB/PND/Orthopnea. Weight at home 115-120 pounds. Current smoker 1/2 pack per day     Review of Systems: All pertinent positives and negatives as in HPI, otherwise negative.    Past Medical History  Diagnosis Date  . Breast cancer   . Headache(784.0)   . Back pain   . 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 [place] fever   . Wears dentures     top  . Allergy   . Anemia   . Blood transfusion without reported diagnosis     2x s/p chemotherrapy     Current Outpatient Prescriptions  Medication Sig Dispense Refill  . albuterol (VENTOLIN HFA) 108 (90 BASE) MCG/ACT inhaler Inhale 2 puffs into the lungs every 6 (six) hours as needed.  1 Inhaler  3  . cetirizine (ZYRTEC) 10 MG tablet Take 10 mg by mouth daily as needed.       . fluticasone  (FLONASE) 50 MCG/ACT nasal spray Place 2 sprays into the nose daily as needed.      Marland Kitchen HYDROcodone-acetaminophen (NORCO/VICODIN) 5-325 MG per tablet Take 1-2 tablets by mouth every 4 (four) hours as needed for pain.  30 tablet  0  . lidocaine-prilocaine (EMLA) cream Apply topically as needed.  30 g  7  . naproxen sodium (ANAPROX) 220 MG tablet Take 220 mg by mouth as needed.       Marland Kitchen omeprazole (PRILOSEC) 40 MG capsule TAKE 1 CAPSULE BY MOUTH DAILY  30 capsule  2   No current facility-administered medications for this encounter.    Allergies  Allergen Reactions  . Aspirin     Unknown reaction  . Iodinated Diagnostic Agents Hives    Pt states in [place] got hives and itching with IV contrast. Per pt allergy was to prev CT.    History   Social History  . Marital Status: Married    Spouse Name: N/A    Number of Children: 2  . Years of Education: N/A   Occupational History  . Property Manager    Social History Main Topics  . Smoking status: Current Every Day Smoker -- 0.50 packs/day for 24 years    Types: Cigarettes  . Smokeless tobacco: Never Used  . Alcohol Use: No  .  Drug Use: No  . Sexual Activity: Yes     Comment: trying to quit   Other Topics Concern  . Not on file   Social History Narrative  . No narrative on file     PHYSICAL EXAM: Filed Vitals:   03/18/13 1346  BP: 106/74  Pulse: 65  Weight: 120 lb 1.6 oz (54.477 kg)  SpO2: 100%    General:  Well appearing. No respiratory difficulty HEENT: normal Neck: supple. no JVD. Carotids 2+ bilat; no bruits. No lymphadenopathy or thryomegaly appreciated. Cor: PMI nondisplaced. Regular rate & rhythm. No rubs, gallops or murmurs. Lungs: coarse breath sounds Abdomen: soft, nontender, nondistended. No hepatosplenomegaly. No bruits or masses. Good bowel sounds. Extremities: no cyanosis, clubbing, rash, LLE trace - 1+ at ankle. Mild varicosities  RLE no edema Neuro: alert & oriented x 3, cranial nerves  grossly intact. moves all 4 extremities w/o difficulty. Affect pleasant.  ASSESSMENT & PLAN:  1. L Breast Cancer Upper-Inner Quadrant  On herceptin.every 3 weeks.  Plans to complete 07/2013 Dr Gala Romney reviewed and discussed ECHO. EF and lateral s' stable Follow up the January for repeat ECHO  2. Current tobacco Declines smoking cessation.   Follow up in early January with an ECHO and Dr Gala Romney  MelissaAMY 1:56 PM  Patient seen and examined with Tonye Becket, NP. We discussed all aspects of the encounter. I agree with the assessment and plan as stated above. I reviewed echos personally. EF and Doppler parameters stable. No HF on exam. Continue Herceptin.   Vendela Troung,MD 2:35 PM

## 2013-03-18 NOTE — Progress Notes (Signed)
Echocardiogram 2D Echocardiogram has been performed.  Melissa [name] 03/18/2013, 1:48 PM

## 2013-03-21 ENCOUNTER — Ambulatory Visit
Admission: RE | Admit: 2013-03-21 | Discharge: 2013-03-21 | Disposition: A | Discharge status: Home / Self Care | Payer: 59 | Source: Ambulatory Visit | Attending: Radiation Oncology | Admitting: Radiation Oncology

## 2013-03-21 DIAGNOSIS — Z510 Encounter for antineoplastic radiation therapy: Secondary | ICD-10-CM | POA: Diagnosis not present

## 2013-03-23 ENCOUNTER — Encounter: Payer: Self-pay | Admitting: Radiation Oncology

## 2013-03-23 NOTE — Progress Notes (Signed)
Simulation verification note: The patient underwent similar to verification on 03/21/2013 for treatment to her left breast. Her isocenter was in good position and the multileaf collimators contoured the treatment volume appropriately.

## 2013-03-25 ENCOUNTER — Ambulatory Visit
Admission: RE | Admit: 2013-03-25 | Discharge: 2013-03-25 | Disposition: A | Discharge status: Home / Self Care | Payer: 59 | Source: Ambulatory Visit | Attending: Radiation Oncology | Admitting: Radiation Oncology

## 2013-03-25 DIAGNOSIS — Z510 Encounter for antineoplastic radiation therapy: Secondary | ICD-10-CM | POA: Diagnosis not present

## 2013-03-26 ENCOUNTER — Ambulatory Visit: Admission: RE | Admit: 2013-03-26 | Payer: 59 | Source: Ambulatory Visit | Admitting: Radiation Oncology

## 2013-03-26 ENCOUNTER — Ambulatory Visit
Admission: RE | Admit: 2013-03-26 | Discharge: 2013-03-26 | Disposition: A | Discharge status: Home / Self Care | Payer: 59 | Source: Ambulatory Visit | Attending: Radiation Oncology | Admitting: Radiation Oncology

## 2013-03-26 ENCOUNTER — Encounter: Payer: Self-pay | Admitting: Radiation Oncology

## 2013-03-26 VITALS — BP 109/75 | HR 70 | Temp 98.5°F | Resp 20 | Wt 121.4 lb

## 2013-03-26 DIAGNOSIS — C50212 Malignant neoplasm of upper-inner quadrant of left female breast: Secondary | ICD-10-CM

## 2013-03-26 DIAGNOSIS — Z510 Encounter for antineoplastic radiation therapy: Secondary | ICD-10-CM | POA: Diagnosis not present

## 2013-03-26 MED ORDER — RADIAPLEXRX EX GEL
Freq: Once | CUTANEOUS | Status: AC
  Administered 2013-03-26: 13:00:00 via TOPICAL

## 2013-03-26 MED ORDER — ALRA NON-METALLIC DEODORANT (RAD-ONC)
1.0000 "application " | Freq: Once | TOPICAL | Status: AC
  Administered 2013-03-26: 1 via TOPICAL

## 2013-03-26 NOTE — Progress Notes (Addendum)
Post sim ed completed w/pt. Gave pt "Radiation and You" booklet w/all pertinent information marked and discussed, re: fatigue, skin irritation/care, nutrition, pain. Gave pt Radiaplex, [place] w/instructions for proper use. Pt verbalized understanding. Pt denies pain, fatigue, loss of appetite.

## 2013-03-26 NOTE — Progress Notes (Signed)
Weekly Management Note:  Site: Left breast Current Dose:  400  cGy Projected Dose: 5000  cGy followed by left breast boost  Narrative: The patient is seen today for routine under treatment assessment. CBCT/MVCT images/port films were reviewed. The chart was reviewed.   She is without complaints today. She had patient education today.  Physical Examination:  Filed Vitals:   03/26/13 1249  BP: 109/75  Pulse: 70  Temp: 98.5 F (36.9 C)  Resp: 20  .  Weight: 121 lb 6.4 oz (55.067 kg). No change.  Impression: Tolerating radiation therapy well.  Plan: Continue radiation therapy as planned.

## 2013-03-27 ENCOUNTER — Ambulatory Visit
Admission: RE | Admit: 2013-03-27 | Discharge: 2013-03-27 | Disposition: A | Discharge status: Home / Self Care | Payer: 59 | Source: Ambulatory Visit | Attending: Radiation Oncology | Admitting: Radiation Oncology

## 2013-03-27 DIAGNOSIS — Z510 Encounter for antineoplastic radiation therapy: Secondary | ICD-10-CM | POA: Diagnosis not present

## 2013-03-27 NOTE — Progress Notes (Signed)
This encounter was created in error - please disregard.

## 2013-03-28 ENCOUNTER — Ambulatory Visit
Admission: RE | Admit: 2013-03-28 | Discharge: 2013-03-28 | Disposition: A | Discharge status: Home / Self Care | Payer: 59 | Source: Ambulatory Visit | Attending: Radiation Oncology | Admitting: Radiation Oncology

## 2013-03-28 DIAGNOSIS — Z510 Encounter for antineoplastic radiation therapy: Secondary | ICD-10-CM | POA: Diagnosis not present

## 2013-03-29 ENCOUNTER — Ambulatory Visit
Admission: RE | Admit: 2013-03-29 | Discharge: 2013-03-29 | Disposition: A | Discharge status: Home / Self Care | Payer: 59 | Source: Ambulatory Visit | Attending: Radiation Oncology | Admitting: Radiation Oncology

## 2013-03-29 DIAGNOSIS — Z510 Encounter for antineoplastic radiation therapy: Secondary | ICD-10-CM | POA: Diagnosis not present

## 2013-04-01 ENCOUNTER — Encounter: Payer: Self-pay | Admitting: Radiation Oncology

## 2013-04-01 ENCOUNTER — Ambulatory Visit
Admission: RE | Admit: 2013-04-01 | Discharge: 2013-04-01 | Disposition: A | Discharge status: Home / Self Care | Payer: 59 | Source: Ambulatory Visit | Attending: Radiation Oncology | Admitting: Radiation Oncology

## 2013-04-01 VITALS — BP 107/74 | HR 66 | Temp 97.9°F | Resp 20 | Wt 121.0 lb

## 2013-04-01 DIAGNOSIS — Z510 Encounter for antineoplastic radiation therapy: Secondary | ICD-10-CM | POA: Diagnosis not present

## 2013-04-01 DIAGNOSIS — C50212 Malignant neoplasm of upper-inner quadrant of left female breast: Secondary | ICD-10-CM

## 2013-04-01 NOTE — Progress Notes (Signed)
Weekly Management Note:  Site: Left breast Current Dose:  1200  cGy Projected Dose: 5000  cGy  Narrative: The patient is seen today for routine under treatment assessment. CBCT/MVCT images/port films were reviewed. The chart was reviewed.   She is really doing well although she does get "dizzy" after radiation therapy treatments when waiting for her car. She waits until later in the morning to have breakfast. She uses Radioplex gel.  Physical Examination:  Filed Vitals:   04/01/13 0846  BP: 107/74  Pulse: 66  Temp: 97.9 F (36.6 C)  Resp: 20  .  Weight: 121 lb (54.885 kg). There is faint erythema along the central left breast with no areas of desquamation.  Impression: Tolerating radiation therapy well. We suggested to her that she have breakfast for her treatments in that she may be hypoglycemic.  Plan: Continue radiation therapy as planned.

## 2013-04-01 NOTE — Progress Notes (Signed)
Pt denies pain, fatigue, loss of appetite. She states she has noticed slight skin changes, is applying Radiaplex. Pt reports dizziness after radiation treatments. She states she does not eat, only drinks coffee prior to treatment. Suggested she eat some carbohydrates, banana or similar foods and drink fluids prior to treatment to alleviate dizziness. Pt verbalized understanding, agreement.

## 2013-04-02 ENCOUNTER — Ambulatory Visit: Admission: RE | Admit: 2013-04-02 | Payer: 59 | Source: Ambulatory Visit

## 2013-04-03 ENCOUNTER — Ambulatory Visit (HOSPITAL_BASED_OUTPATIENT_CLINIC_OR_DEPARTMENT_OTHER): Payer: 59 | Admitting: Oncology

## 2013-04-03 ENCOUNTER — Other Ambulatory Visit (HOSPITAL_BASED_OUTPATIENT_CLINIC_OR_DEPARTMENT_OTHER): Payer: 59 | Admitting: Lab

## 2013-04-03 ENCOUNTER — Ambulatory Visit (HOSPITAL_BASED_OUTPATIENT_CLINIC_OR_DEPARTMENT_OTHER): Payer: 59

## 2013-04-03 ENCOUNTER — Encounter: Payer: Self-pay | Admitting: Oncology

## 2013-04-03 ENCOUNTER — Ambulatory Visit
Admission: RE | Admit: 2013-04-03 | Discharge: 2013-04-03 | Disposition: A | Discharge status: Home / Self Care | Payer: 59 | Source: Ambulatory Visit | Attending: Radiation Oncology | Admitting: Radiation Oncology

## 2013-04-03 VITALS — BP 112/75 | HR 87 | Temp 98.3°F | Resp 20 | Ht 62.0 in | Wt 121.4 lb

## 2013-04-03 DIAGNOSIS — C50919 Malignant neoplasm of unspecified site of unspecified female breast: Secondary | ICD-10-CM

## 2013-04-03 DIAGNOSIS — Z510 Encounter for antineoplastic radiation therapy: Secondary | ICD-10-CM | POA: Diagnosis not present

## 2013-04-03 DIAGNOSIS — C50219 Malignant neoplasm of upper-inner quadrant of unspecified female breast: Secondary | ICD-10-CM

## 2013-04-03 DIAGNOSIS — Z170 Estrogen receptor positive status [ER+]: Secondary | ICD-10-CM

## 2013-04-03 DIAGNOSIS — C50212 Malignant neoplasm of upper-inner quadrant of left female breast: Secondary | ICD-10-CM

## 2013-04-03 DIAGNOSIS — Z5112 Encounter for antineoplastic immunotherapy: Secondary | ICD-10-CM

## 2013-04-03 LAB — COMPREHENSIVE METABOLIC PANEL (CC13)
ALT: 13 U/L (ref 0–55)
Albumin: 3.8 g/dL (ref 3.5–5.0)
CO2: 24 mEq/L (ref 22–29)
Calcium: 10.1 mg/dL (ref 8.4–10.4)
Chloride: 108 mEq/L (ref 98–109)
Glucose: 89 mg/dl (ref 70–140)
Potassium: 4.2 mEq/L (ref 3.5–5.1)
Sodium: 141 mEq/L (ref 136–145)
Total Bilirubin: 0.27 mg/dL (ref 0.20–1.20)
Total Protein: 7.5 g/dL (ref 6.4–8.3)

## 2013-04-03 LAB — CBC WITH DIFFERENTIAL/PLATELET
Basophils Absolute: 0.1 10*3/uL (ref 0.0–0.1)
Eosinophils Absolute: 0.5 10*3/uL (ref 0.0–0.5)
HGB: 13.7 g/dL (ref 11.6–15.9)
MONO#: 0.5 10*3/uL (ref 0.1–0.9)
NEUT#: 4.6 10*3/uL (ref 1.5–6.5)
RBC: 4.05 10*6/uL (ref 3.70–5.45)
RDW: 13 % (ref 11.2–14.5)
WBC: 7.8 10*3/uL (ref 3.9–10.3)
lymph#: 2.1 10*3/uL (ref 0.9–3.3)
nRBC: 0 % (ref 0–0)

## 2013-04-03 MED ORDER — ACETAMINOPHEN 325 MG PO TABS
ORAL_TABLET | ORAL | Status: AC
  Filled 2013-04-03: qty 2

## 2013-04-03 MED ORDER — SODIUM CHLORIDE 0.9 % IV SOLN
Freq: Once | INTRAVENOUS | Status: AC
  Administered 2013-04-03: 10:00:00 via INTRAVENOUS

## 2013-04-03 MED ORDER — DIPHENHYDRAMINE HCL 25 MG PO CAPS
50.0000 mg | ORAL_CAPSULE | Freq: Once | ORAL | Status: AC
  Administered 2013-04-03: 25 mg via ORAL

## 2013-04-03 MED ORDER — SODIUM CHLORIDE 0.9 % IJ SOLN
10.0000 mL | INTRAMUSCULAR | Status: DC | PRN
  Administered 2013-04-03: 10 mL
  Filled 2013-04-03: qty 10

## 2013-04-03 MED ORDER — ACETAMINOPHEN 325 MG PO TABS
650.0000 mg | ORAL_TABLET | Freq: Once | ORAL | Status: AC
  Administered 2013-04-03: 650 mg via ORAL

## 2013-04-03 MED ORDER — TRASTUZUMAB CHEMO INJECTION 440 MG
6.0000 mg/kg | Freq: Once | INTRAVENOUS | Status: AC
  Administered 2013-04-03: 336 mg via INTRAVENOUS
  Filled 2013-04-03: qty 16

## 2013-04-03 MED ORDER — HEPARIN SOD (PORK) LOCK FLUSH 100 UNIT/ML IV SOLN
500.0000 [IU] | Freq: Once | INTRAVENOUS | Status: AC | PRN
  Administered 2013-04-03: 500 [IU]
  Filled 2013-04-03: qty 5

## 2013-04-03 MED ORDER — DIPHENHYDRAMINE HCL 25 MG PO CAPS
ORAL_CAPSULE | ORAL | Status: AC
  Filled 2013-04-03: qty 1

## 2013-04-03 NOTE — Patient Instructions (Signed)
[facility] Discharge Instructions for Patients Receiving Chemotherapy  Today you received the following chemotherapy agents  HERCEPTIN.  To help prevent nausea and vomiting after your treatment, we encourage you to take your nausea medication AS PRESCRIBED.    If you develop nausea and vomiting that is not controlled by your nausea medication, call the clinic.   BELOW ARE SYMPTOMS THAT SHOULD BE REPORTED IMMEDIATELY:  *FEVER GREATER THAN 100.5 F  *CHILLS WITH OR WITHOUT FEVER  NAUSEA AND VOMITING THAT IS NOT CONTROLLED WITH YOUR NAUSEA MEDICATION  *UNUSUAL SHORTNESS OF BREATH  *UNUSUAL BRUISING OR BLEEDING  TENDERNESS IN MOUTH AND THROAT WITH OR WITHOUT PRESENCE OF ULCERS  *URINARY PROBLEMS  *BOWEL PROBLEMS  UNUSUAL RASH Items with * indicate a potential emergency and should be followed up as soon as possible.  Feel free to call the clinic you have any questions or concerns. The clinic phone number is [phone].  Trastuzumab injection for infusion What is this medicine? TRASTUZUMAB (tras TOO zoo mab) is a monoclonal antibody. It targets a protein called HER2. This protein is found in some stomach and breast cancers. This medicine can stop cancer cell growth. This medicine may be used with other cancer treatments. This medicine may be used for other purposes; ask your health care provider or pharmacist if you have questions. What should I tell my health care provider before I take this medicine? They need to know if you have any of these conditions: -heart disease -heart failure -infection (especially a virus infection such as chickenpox, cold sores, or herpes) -lung or breathing disease, like asthma -recent or ongoing radiation therapy -an unusual or allergic reaction to trastuzumab, benzyl alcohol, or other medications, foods, dyes, or preservatives -pregnant or trying to get pregnant -breast-feeding How should I use this medicine? This drug is  given as an infusion into a vein. It is administered in a hospital or clinic by a specially trained health care professional. Talk to your pediatrician regarding the use of this medicine in children. This medicine is not approved for use in children. Overdosage: If you think you have taken too much of this medicine contact a poison control center or emergency room at once. NOTE: This medicine is only for you. Do not share this medicine with others. What if I miss a dose? It is important not to miss a dose. Call your doctor or health care professional if you are unable to keep an appointment. What may interact with this medicine? -cyclophosphamide -doxorubicin -warfarin This list may not describe all possible interactions. Give your health care provider a list of all the medicines, herbs, non-prescription drugs, or dietary supplements you use. Also tell them if you smoke, drink alcohol, or use illegal drugs. Some items may interact with your medicine. What should I watch for while using this medicine? Visit your doctor for checks on your progress. Report any side effects. Continue your course of treatment even though you feel ill unless your doctor tells you to stop. Call your doctor or health care professional for advice if you get a fever, chills or sore throat, or other symptoms of a cold or flu. Do not treat yourself. Try to avoid being around people who are sick. You may experience fever, chills and shaking during your first infusion. These effects are usually mild and can be treated with other medicines. Report any side effects during the infusion to your health care professional. Fever and chills usually do not happen with later infusions. What  side effects may I notice from receiving this medicine? Side effects that you should report to your doctor or other health care professional as soon as possible: -breathing difficulties -chest pain or palpitations -cough -dizziness or  fainting -fever or chills, sore throat -skin rash, itching or hives -swelling of the legs or ankles -unusually weak or tired Side effects that usually do not require medical attention (report to your doctor or other health care professional if they continue or are bothersome): -loss of appetite -headache -muscle aches -nausea This list may not describe all possible side effects. Call your doctor for medical advice about side effects. You may report side effects to FDA at 1-800-FDA-1088. Where should I keep my medicine? This drug is given in a hospital or clinic and will not be stored at home. NOTE: This sheet is a summary. It may not cover all possible information. If you have questions about this medicine, talk to your doctor, pharmacist, or health care provider.  2013, [employer]. (05/01/2009 1:43:15 PM)

## 2013-04-03 NOTE — Progress Notes (Signed)
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  PRIOR THERAPY:  #1 patient was originally seen in the multidisciplinary breast clinic when she found a left breast mass she eventually had MRI and mammograms performed. She was diagnosed with invasive ductal carcinoma that was ER positive PR positive HER-2/neu positive measuring 3.1 cm by MRI criteria. Ki-67 was 70% HER-2 was amplified with a ratio 2.91.  #2 patient was seen at the multidisciplinary breast clinic and a recommendation for neoadjuvant chemotherapy was recommended as patient was interested in breast conservation. She will receive neoadjuvant Taxotere carboplatinum Herceptin. Taxotere carboplatinum will be given every 21 days with weekly Herceptin. Total of 6 cycles of Taxotere and carboplatinum will be administered.  This was started 07/12/12.  3 patient had MRI of the breasts performed on 10/15/2012 which does show reduction in the size of the left breast mass from 3.1to 1.2. This is a wonderful response.  4. Patient begun on every 3 week herceptin beginning 11/29/12.  5. Patient underwent lumpectomy on 12/31/12 a 1.8 cm tumor was removed, the margins were 0.1cm and 1/1 sentinel nodes were positive disease.  A re-excision and axillary node dissection was recommended by Dr. Welton Flakes. An ALND occurred on 02/08/13 and 0/13 lymph nodes were positive for metastases.    CURRENT THERAPY:   Herceptin every 3 weeks  INTERVAL HISTORY:  Patient returns in followup visit prior to every 3 week Herceptin.  She's doing well today.  She denies shortness of breath, DOE, palpitations, PND, orthopnea or any other concerns.  She will be simulated for radiation tomorrow, and has an appt with Dr. Gala Romney on  03/18/13 for cardiac evaluation and echocardiogram.  A 10 point ROS is neg.    Past Medical History  Diagnosis Date  . Breast cancer   . Headache(784.0)   . Back pain   . 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 [place] fever   . Wears dentures     top  . Allergy   . Anemia   . Blood transfusion without reported diagnosis     2x s/p chemotherrapy     ALLERGIES:  is allergic to aspirin and iodinated diagnostic agents.  MEDICATIONS:  Current Outpatient Prescriptions  Medication Sig Dispense Refill  . albuterol (VENTOLIN HFA) 108 (90 BASE) MCG/ACT inhaler Inhale 2 puffs into the lungs every 6 (six) hours as needed.  1 Inhaler  3  . cetirizine (ZYRTEC) 10 MG tablet Take 10 mg by mouth daily as needed.       . fluticasone (FLONASE) 50 MCG/ACT nasal spray Place 2 sprays into the nose daily as needed.      Marland Kitchen HYDROcodone-acetaminophen (NORCO/VICODIN) 5-325 MG per tablet Take 1-2 tablets by mouth every 4 (four) hours as needed for pain.  30 tablet  0  . lidocaine-prilocaine (EMLA) cream Apply topically as needed.  30 g  7  . naproxen sodium (ANAPROX) 220 MG tablet Take 220 mg by mouth as needed.       Marland Kitchen omeprazole (PRILOSEC) 40 MG capsule TAKE 1 CAPSULE BY MOUTH DAILY  30 capsule  2   No current facility-administered medications for this visit.    SURGICAL HISTORY:  Past Surgical History  Procedure Laterality Date  . 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 Cesarean section  1995/1996  . Lobectomy  1997    upper left  . Portacath placement  07/02/2012    Procedure: INSERTION PORT-A-CATH;  Surgeon: Mariella Saa, MD;  Location: [facility];  Service: General;  Laterality: N/A;  right  . Breast surgery  2006    cyst rt br-neg  . Breast lumpectomy with needle localization and axillary sentinel lymph node bx Left 12/31/2012    Procedure: NEEDLE LOCALIZATION LEFT BREAST LUMPECTOMY AND LEFT  AXILLARY SENTENIAL LYMPH NODE BX;  Surgeon: Mariella Saa, MD;  Location: [facility];  Service: General;  Laterality: Left;  . Axillary lymph node dissection Left 02/08/2013    Procedure: LEFT AXILLARY DISSECTION;  Surgeon: Mariella Saa, MD;  Location: [facility];  Service: General;  Laterality: Left;    REVIEW OF SYSTEMS:   General: fatigue (-), night sweats (-), fever (-), pain (-) Lymph: palpable nodes (-) HEENT: vision changes (-), mucositis (-), gum bleeding (-), epistaxis (-) Cardiovascular: chest pain (-), palpitations (-) Pulmonary: shortness of breath (-), dyspnea on exertion (-), cough (-), hemoptysis (-) GI:  Early satiety (-), melena (-), dysphagia (-), nausea/vomiting (-), diarrhea (-) GU: dysuria (-), hematuria (-), incontinence (-) Musculoskeletal: joint swelling (-), joint pain (-), back pain (-) Neuro: weakness (-), numbness (-), headache (-), confusion (-) Skin: Rash (-), lesions (-), dryness (-) Psych: depression (-), suicidal/homicidal ideation (-), feeling of hopelessness (-)   PHYSICAL EXAMINATION: Blood pressure 112/75, pulse 87, temperature 98.3 F (36.8 C), temperature source Oral, resp. rate 20, height 5\' 2"  (1.575 m), weight 121 lb 6.4 oz (55.067 kg). Body mass index is 22.2 kg/(m^2). General: Patient is a well appearing female in no acute distress HEENT: PERRLA, sclerae anicteric no conjunctival pallor, MMM, ulceration in bilateral buccal mucosa, blistering in corners of mouth.   Neck: supple, no palpable adenopathy Lungs: mild wheezing in the left lung.  Right lung clear to auscultation Cardiovascular: regular rate rhythm, S1, S2, no murmurs, rubs or gallops Abdomen: Soft, non-tender, non-distended, normoactive bowel sounds, no HSM Extremities: warm and well perfused, no clubbing, cyanosis, or edema Skin: No rashes or lesions Neuro: Non-focal Breasts: The right breast is unremarkable. Left breast lumpectomy site  intact healing well, left axilla surgical site healing well, no erythema or exudate.   ECOG PERFORMANCE STATUS: 1  LABORATORY DATA: Lab Results  Component Value Date   WBC 7.8 04/03/2013   HGB 13.7 04/03/2013   HCT 39.2 04/03/2013   MCV 96.8 04/03/2013   PLT 222 04/03/2013      Chemistry      Component Value Date/Time   NA 140 03/13/2013 0910   NA 141 01/26/2011 1039   K 3.9 03/13/2013 0910   K 4.5 01/26/2011 1039   CL 100 12/20/2012 1509   CL 106 01/26/2011 1039   CO2 24 03/13/2013 0910   CO2 28 01/26/2011 1039   BUN 8.3 03/13/2013 0910   BUN 7 01/26/2011 1039   CREATININE 0.8 03/13/2013 0910   CREATININE 0.6 01/26/2011 1039      Component Value Date/Time   CALCIUM 9.7 03/13/2013 0910   CALCIUM 9.2 01/26/2011 1039   ALKPHOS 76 03/13/2013 0910   AST 16 03/13/2013 0910   ALT 15 03/13/2013 0910   BILITOT 0.26 03/13/2013 0910       RADIOGRAPHIC STUDIES: Dg Chest 2 View  07/30/2012  *RADIOLOGY REPORT*  Clinical Data: Bronchiectasis.  History of breast cancer.  CHEST - 2 VIEW  Comparison: 07/02/2012 and 05/03/2011  Findings: Power port in place.  There is now an ill-defined 16 mm in density in the left lung base which correlates with the previous cystic lesion seen on CT scan of 01/26/2011.  There is central lucency.  I suspect this represents retraction and wall thickening of the previously seen area of cystic bronchiectasis.  Scarring at the left apex medially due to cystic bronchiectasis appears slightly less prominent.  The right lung is clear.  Heart size is normal.  Previous left upper lobectomy.  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  No other change since the prior exam.   Original Report Authenticated By: Francene Boyers, M.D.      ASSESSMENT: 47 year old [place] woman  with  #1 a clinical stage IIA (3.1 cm) invasive ductal carcinoma of the left breast status post needle core biopsy 06/05/2012 that showed ER positive PR positive  HER-2/neu positive breast cancer with a Ki-67 at 70%.   #2 started chemotherapy on 07/13/2011 consisting of carboplatin, docetaxel and trastuzumab every 3 weeks, with the trastuzumab given weekly  #3 trastuzumab every 3 weeks to be continued to complete a year;   #4 Lumpectomy and Sentinel node biopsy on 12/31/12, sentinel nodes were positive.  She underwent ALND on 02/08/13 and 0/13 lymph nodes removed were positive.    PLAN:   1. [name] is feeling well today. She will proceed with herceptin today.  She will  F/u with radiation oncology and cardiology within the next week.    2. She will return in 3 weeks for Herceptin.    All questions were answered. The patient knows to call the clinic with any problems, questions or concerns. We can certainly see the patient much sooner if necessary.   The length of time of the face-to-face encounter was 25    minutes. More than 50% of time was spent counseling and coordination of care.

## 2013-04-04 ENCOUNTER — Ambulatory Visit
Admission: RE | Admit: 2013-04-04 | Discharge: 2013-04-04 | Disposition: A | Discharge status: Home / Self Care | Payer: 59 | Source: Ambulatory Visit | Attending: Radiation Oncology | Admitting: Radiation Oncology

## 2013-04-04 DIAGNOSIS — Z510 Encounter for antineoplastic radiation therapy: Secondary | ICD-10-CM | POA: Diagnosis not present

## 2013-04-05 ENCOUNTER — Ambulatory Visit
Admission: RE | Admit: 2013-04-05 | Discharge: 2013-04-05 | Disposition: A | Discharge status: Home / Self Care | Payer: 59 | Source: Ambulatory Visit | Attending: Radiation Oncology | Admitting: Radiation Oncology

## 2013-04-05 DIAGNOSIS — Z510 Encounter for antineoplastic radiation therapy: Secondary | ICD-10-CM | POA: Diagnosis not present

## 2013-04-08 ENCOUNTER — Ambulatory Visit
Admission: RE | Admit: 2013-04-08 | Discharge: 2013-04-08 | Disposition: A | Discharge status: Home / Self Care | Payer: 59 | Source: Ambulatory Visit | Attending: Radiation Oncology | Admitting: Radiation Oncology

## 2013-04-08 ENCOUNTER — Encounter: Payer: Self-pay | Admitting: Radiation Oncology

## 2013-04-08 VITALS — BP 98/77 | HR 83 | Temp 98.4°F | Resp 20 | Wt 120.9 lb

## 2013-04-08 DIAGNOSIS — Z510 Encounter for antineoplastic radiation therapy: Secondary | ICD-10-CM | POA: Diagnosis not present

## 2013-04-08 DIAGNOSIS — C50212 Malignant neoplasm of upper-inner quadrant of left female breast: Secondary | ICD-10-CM

## 2013-04-08 NOTE — Progress Notes (Signed)
Weekly Management Note:  Site: Left breast Current Dose:  2000  cGy Projected Dose: 5000  cGy followed by boost  Narrative: The patient is seen today for routine under treatment assessment. CBCT/MVCT images/port films were reviewed. The chart was reviewed.   No complaints today except for allergies. She does have mild fatigue. She takes Zyrtec for her allergies. She is using Radioplex gel to her left breast.  Physical Examination:  Filed Vitals:   04/08/13 0845  BP: 98/77  Pulse: 83  Temp: 98.4 F (36.9 C)  Resp: 20  .  Weight: 120 lb 14.4 oz (54.84 kg). There is mild erythema, centrally along the left breast. There is no desquamation.  Impression: Tolerating radiation therapy well.  Plan: Continue radiation therapy as planned.

## 2013-04-08 NOTE — Progress Notes (Signed)
Pt reports her allergies are bothering her, causing some fatigue. She is applying Radiaplex to left breast treatment area, states her skin is very slightly pink but not irritated.

## 2013-04-09 ENCOUNTER — Ambulatory Visit
Admission: RE | Admit: 2013-04-09 | Discharge: 2013-04-09 | Disposition: A | Discharge status: Home / Self Care | Payer: 59 | Source: Ambulatory Visit | Attending: Radiation Oncology | Admitting: Radiation Oncology

## 2013-04-09 ENCOUNTER — Encounter: Payer: Self-pay | Admitting: Radiation Oncology

## 2013-04-09 DIAGNOSIS — Z510 Encounter for antineoplastic radiation therapy: Secondary | ICD-10-CM | POA: Diagnosis not present

## 2013-04-09 NOTE — Progress Notes (Signed)
Complex simulation note: The patient underwent virtual simulation for her breast boost with electrons. She was set up en face. One custom block was constructed to shield the normal shredding structures. A special port was requested. I am prescribing 1000 cGy in 5 sessions utilizing 9 MEV electrons. The patient is being treated to the 90% isodose curve.

## 2013-04-10 ENCOUNTER — Ambulatory Visit
Admission: RE | Admit: 2013-04-10 | Discharge: 2013-04-10 | Disposition: A | Discharge status: Home / Self Care | Payer: 59 | Source: Ambulatory Visit | Attending: Radiation Oncology | Admitting: Radiation Oncology

## 2013-04-10 DIAGNOSIS — Z510 Encounter for antineoplastic radiation therapy: Secondary | ICD-10-CM | POA: Diagnosis not present

## 2013-04-11 ENCOUNTER — Encounter: Payer: Self-pay | Admitting: Radiation Oncology

## 2013-04-11 ENCOUNTER — Ambulatory Visit
Admission: RE | Admit: 2013-04-11 | Discharge: 2013-04-11 | Disposition: A | Discharge status: Home / Self Care | Payer: 59 | Source: Ambulatory Visit | Attending: Radiation Oncology | Admitting: Radiation Oncology

## 2013-04-11 DIAGNOSIS — Z510 Encounter for antineoplastic radiation therapy: Secondary | ICD-10-CM | POA: Diagnosis not present

## 2013-04-11 NOTE — Progress Notes (Signed)
Weekly Management Note:  Site: Left breast Current Dose:  2600  cGy Projected Dose: 5000  cGy followed by boost  Narrative: The patient is seen today for routine under treatment assessment. CBCT/MVCT images/port films were reviewed. The chart was reviewed.   She is without new complaints today. She uses Radioplex gel.  Physical Examination: There were no vitals filed for this visit..  Weight:  . There is erythema and hyperpigmentation noted along the left breast with no areas of desquamation.  Impression: Tolerating radiation therapy well.  Plan: Continue radiation therapy as planned.

## 2013-04-12 ENCOUNTER — Ambulatory Visit
Admission: RE | Admit: 2013-04-12 | Discharge: 2013-04-12 | Disposition: A | Discharge status: Home / Self Care | Payer: 59 | Source: Ambulatory Visit | Attending: Radiation Oncology | Admitting: Radiation Oncology

## 2013-04-12 ENCOUNTER — Encounter: Payer: Self-pay | Admitting: Oncology

## 2013-04-12 DIAGNOSIS — Z510 Encounter for antineoplastic radiation therapy: Secondary | ICD-10-CM | POA: Diagnosis not present

## 2013-04-12 NOTE — Progress Notes (Signed)
LMOVM for patient to call me if she would like herceptin assistance.

## 2013-04-15 ENCOUNTER — Ambulatory Visit
Admission: RE | Admit: 2013-04-15 | Discharge: 2013-04-15 | Disposition: A | Discharge status: Home / Self Care | Payer: 59 | Source: Ambulatory Visit | Attending: Radiation Oncology | Admitting: Radiation Oncology

## 2013-04-15 DIAGNOSIS — Z510 Encounter for antineoplastic radiation therapy: Secondary | ICD-10-CM | POA: Diagnosis not present

## 2013-04-16 ENCOUNTER — Encounter: Payer: Self-pay | Admitting: Radiation Oncology

## 2013-04-16 ENCOUNTER — Ambulatory Visit
Admission: RE | Admit: 2013-04-16 | Discharge: 2013-04-16 | Disposition: A | Discharge status: Home / Self Care | Payer: 59 | Source: Ambulatory Visit | Attending: Radiation Oncology | Admitting: Radiation Oncology

## 2013-04-16 VITALS — BP 109/77 | HR 69 | Temp 98.5°F | Resp 20 | Wt 119.1 lb

## 2013-04-16 DIAGNOSIS — Z510 Encounter for antineoplastic radiation therapy: Secondary | ICD-10-CM | POA: Diagnosis not present

## 2013-04-16 DIAGNOSIS — C50212 Malignant neoplasm of upper-inner quadrant of left female breast: Secondary | ICD-10-CM

## 2013-04-16 NOTE — Progress Notes (Signed)
Pt continues taking Mucinex cough and cold for "chest cold" she has had x 1 week. She states she is slowy improving. Pt applying Radiaplex to left breast, slight pigment changes. Pt beginning to feel fatigued.

## 2013-04-16 NOTE — Addendum Note (Signed)
Encounter addended by: Glennie Hawk, RN on: 04/16/2013  9:19 AM<BR>     Documentation filed: Inpatient Document Flowsheet

## 2013-04-16 NOTE — Progress Notes (Signed)
Weekly Management Note:  Site: Left breast Current Dose:  3200  cGy Projected Dose: 5000  cGy  Narrative: The patient is seen today for routine under treatment assessment. CBCT/MVCT images/port films were reviewed. The chart was reviewed.   She is without complaints today. She uses Radioplex gel. She does have a URI. She does have mild fatigue.  Physical Examination:  Filed Vitals:   04/16/13 0842  BP: 109/77  Pulse: 69  Temp: 98.5 F (36.9 C)  Resp: 20  .  Weight: 119 lb 1.6 oz (54.023 kg). There is mild erythema the skin along the left breast. No areas of desquamation.  Impression: Tolerating radiation therapy well.  Plan: Continue radiation therapy as planned.

## 2013-04-17 ENCOUNTER — Ambulatory Visit
Admission: RE | Admit: 2013-04-17 | Discharge: 2013-04-17 | Disposition: A | Discharge status: Home / Self Care | Payer: 59 | Source: Ambulatory Visit | Attending: Radiation Oncology | Admitting: Radiation Oncology

## 2013-04-17 DIAGNOSIS — Z510 Encounter for antineoplastic radiation therapy: Secondary | ICD-10-CM | POA: Diagnosis not present

## 2013-04-18 ENCOUNTER — Ambulatory Visit
Admission: RE | Admit: 2013-04-18 | Discharge: 2013-04-18 | Disposition: A | Discharge status: Home / Self Care | Payer: 59 | Source: Ambulatory Visit | Attending: Radiation Oncology | Admitting: Radiation Oncology

## 2013-04-18 DIAGNOSIS — Z510 Encounter for antineoplastic radiation therapy: Secondary | ICD-10-CM | POA: Diagnosis not present

## 2013-04-19 ENCOUNTER — Ambulatory Visit
Admission: RE | Admit: 2013-04-19 | Discharge: 2013-04-19 | Disposition: A | Discharge status: Home / Self Care | Payer: 59 | Source: Ambulatory Visit | Attending: Radiation Oncology | Admitting: Radiation Oncology

## 2013-04-19 DIAGNOSIS — Z510 Encounter for antineoplastic radiation therapy: Secondary | ICD-10-CM | POA: Diagnosis not present

## 2013-04-22 ENCOUNTER — Ambulatory Visit
Admission: RE | Admit: 2013-04-22 | Discharge: 2013-04-22 | Disposition: A | Discharge status: Home / Self Care | Payer: 59 | Source: Ambulatory Visit | Attending: Radiation Oncology | Admitting: Radiation Oncology

## 2013-04-22 ENCOUNTER — Encounter: Payer: Self-pay | Admitting: Radiation Oncology

## 2013-04-22 VITALS — BP 113/77 | HR 65 | Temp 98.4°F | Resp 20 | Wt 121.1 lb

## 2013-04-22 DIAGNOSIS — C50212 Malignant neoplasm of upper-inner quadrant of left female breast: Secondary | ICD-10-CM

## 2013-04-22 DIAGNOSIS — Z510 Encounter for antineoplastic radiation therapy: Secondary | ICD-10-CM | POA: Diagnosis not present

## 2013-04-22 NOTE — Progress Notes (Signed)
Weekly Management Note:  Site: Left breast Current Dose:  4000   cGy Projected Dose: 5000  cGy followed by 1 week boost  Narrative: The patient is seen today for routine under treatment assessment. CBCT/MVCT images/port films were reviewed. The chart was reviewed.   No complaints today. She uses Radioplex gel.  Physical Examination:  Filed Vitals:   04/22/13 0847  BP: 113/77  Pulse: 65  Temp: 98.4 F (36.9 C)  Resp: 20  .  Weight: 121 lb 1.6 oz (54.931 kg). There is slight hyperpigmentation/erythema the skin along the left breast with no areas of desquamation.  Impression: Tolerating radiation therapy well.  Plan: Continue radiation therapy as planned.

## 2013-04-22 NOTE — Progress Notes (Signed)
Pt denies pain, loss of appetite. She is fatigued. Applying Radiaplex to left breast treatment area for hyperpigmentation; pt denies skin irritation.

## 2013-04-23 ENCOUNTER — Ambulatory Visit
Admission: RE | Admit: 2013-04-23 | Discharge: 2013-04-23 | Disposition: A | Discharge status: Home / Self Care | Payer: 59 | Source: Ambulatory Visit | Attending: Radiation Oncology | Admitting: Radiation Oncology

## 2013-04-23 ENCOUNTER — Encounter: Payer: Self-pay | Admitting: Radiation Oncology

## 2013-04-23 DIAGNOSIS — Z510 Encounter for antineoplastic radiation therapy: Secondary | ICD-10-CM | POA: Diagnosis not present

## 2013-04-23 NOTE — Progress Notes (Signed)
Weekly Management Note:  Site: Left breast Current Dose:  4200  cGy Projected Dose: 5000 and  cGy followed by left breast boost  Narrative: The patient is seen today for routine under treatment assessment. CBCT/MVCT images/port films were reviewed. The chart was reviewed.   No new complaints today. She uses Radioplex gel.  Physical Examination: There were no vitals filed for this visit..  Weight:  . No change.  Impression: Tolerating radiation therapy well.  Plan: Continue radiation therapy as planned.

## 2013-04-24 ENCOUNTER — Other Ambulatory Visit (HOSPITAL_BASED_OUTPATIENT_CLINIC_OR_DEPARTMENT_OTHER): Payer: 59 | Admitting: Lab

## 2013-04-24 ENCOUNTER — Telehealth: Payer: Self-pay | Admitting: *Deleted

## 2013-04-24 ENCOUNTER — Ambulatory Visit (HOSPITAL_BASED_OUTPATIENT_CLINIC_OR_DEPARTMENT_OTHER): Payer: 59 | Admitting: Adult Health

## 2013-04-24 ENCOUNTER — Ambulatory Visit
Admission: RE | Admit: 2013-04-24 | Discharge: 2013-04-24 | Disposition: A | Discharge status: Home / Self Care | Payer: 59 | Source: Ambulatory Visit | Attending: Radiation Oncology | Admitting: Radiation Oncology

## 2013-04-24 ENCOUNTER — Ambulatory Visit (HOSPITAL_BASED_OUTPATIENT_CLINIC_OR_DEPARTMENT_OTHER): Payer: 59

## 2013-04-24 ENCOUNTER — Encounter: Payer: Self-pay | Admitting: Adult Health

## 2013-04-24 VITALS — BP 102/68 | HR 73 | Temp 98.1°F | Resp 18 | Ht 62.0 in | Wt 121.1 lb

## 2013-04-24 DIAGNOSIS — C50219 Malignant neoplasm of upper-inner quadrant of unspecified female breast: Secondary | ICD-10-CM

## 2013-04-24 DIAGNOSIS — Z5112 Encounter for antineoplastic immunotherapy: Secondary | ICD-10-CM

## 2013-04-24 DIAGNOSIS — C50919 Malignant neoplasm of unspecified site of unspecified female breast: Secondary | ICD-10-CM

## 2013-04-24 DIAGNOSIS — Z170 Estrogen receptor positive status [ER+]: Secondary | ICD-10-CM

## 2013-04-24 DIAGNOSIS — C50212 Malignant neoplasm of upper-inner quadrant of left female breast: Secondary | ICD-10-CM

## 2013-04-24 DIAGNOSIS — C773 Secondary and unspecified malignant neoplasm of axilla and upper limb lymph nodes: Secondary | ICD-10-CM

## 2013-04-24 DIAGNOSIS — Z510 Encounter for antineoplastic radiation therapy: Secondary | ICD-10-CM | POA: Diagnosis not present

## 2013-04-24 LAB — CBC WITH DIFFERENTIAL/PLATELET
BASO%: 0.6 % (ref 0.0–2.0)
Basophils Absolute: 0 10*3/uL (ref 0.0–0.1)
EOS%: 8 % — ABNORMAL HIGH (ref 0.0–7.0)
Eosinophils Absolute: 0.5 10*3/uL (ref 0.0–0.5)
HCT: 40.7 % (ref 34.8–46.6)
LYMPH%: 29 % (ref 14.0–49.7)
MCH: 33.2 pg (ref 25.1–34.0)
MCHC: 34.4 g/dL (ref 31.5–36.0)
MCV: 96.4 fL (ref 79.5–101.0)
MONO#: 0.7 10*3/uL (ref 0.1–0.9)
MONO%: 10.7 % (ref 0.0–14.0)
NEUT#: 3.5 10*3/uL (ref 1.5–6.5)
NEUT%: 51.7 % (ref 38.4–76.8)
Platelets: 200 10*3/uL (ref 145–400)
lymph#: 2 10*3/uL (ref 0.9–3.3)

## 2013-04-24 LAB — COMPREHENSIVE METABOLIC PANEL (CC13)
ALT: 13 U/L (ref 0–55)
AST: 18 U/L (ref 5–34)
BUN: 9.7 mg/dL (ref 7.0–26.0)
Calcium: 9.8 mg/dL (ref 8.4–10.4)
Chloride: 106 mEq/L (ref 98–109)
Creatinine: 0.8 mg/dL (ref 0.6–1.1)
Potassium: 4.3 mEq/L (ref 3.5–5.1)
Total Bilirubin: 0.22 mg/dL (ref 0.20–1.20)

## 2013-04-24 MED ORDER — SODIUM CHLORIDE 0.9 % IV SOLN
Freq: Once | INTRAVENOUS | Status: AC
  Administered 2013-04-24: 11:00:00 via INTRAVENOUS

## 2013-04-24 MED ORDER — DIPHENHYDRAMINE HCL 25 MG PO CAPS
ORAL_CAPSULE | ORAL | Status: AC
  Filled 2013-04-24: qty 1

## 2013-04-24 MED ORDER — ACETAMINOPHEN 325 MG PO TABS
ORAL_TABLET | ORAL | Status: AC
  Filled 2013-04-24: qty 2

## 2013-04-24 MED ORDER — ACETAMINOPHEN 325 MG PO TABS
650.0000 mg | ORAL_TABLET | Freq: Once | ORAL | Status: AC
  Administered 2013-04-24: 650 mg via ORAL

## 2013-04-24 MED ORDER — HEPARIN SOD (PORK) LOCK FLUSH 100 UNIT/ML IV SOLN
500.0000 [IU] | Freq: Once | INTRAVENOUS | Status: AC | PRN
  Administered 2013-04-24: 500 [IU]
  Filled 2013-04-24: qty 5

## 2013-04-24 MED ORDER — TRASTUZUMAB CHEMO INJECTION 440 MG
6.0000 mg/kg | Freq: Once | INTRAVENOUS | Status: AC
  Administered 2013-04-24: 336 mg via INTRAVENOUS
  Filled 2013-04-24: qty 16

## 2013-04-24 MED ORDER — DIPHENHYDRAMINE HCL 25 MG PO CAPS
50.0000 mg | ORAL_CAPSULE | Freq: Once | ORAL | Status: AC
  Administered 2013-04-24: 25 mg via ORAL

## 2013-04-24 MED ORDER — SODIUM CHLORIDE 0.9 % IJ SOLN
10.0000 mL | INTRAMUSCULAR | Status: DC | PRN
  Administered 2013-04-24: 10 mL
  Filled 2013-04-24: qty 10

## 2013-04-24 NOTE — Patient Instructions (Signed)
Doing well.  Proceed with Herceptin.  Please call us if you have any questions or concerns.    

## 2013-04-24 NOTE — Progress Notes (Signed)
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  PRIOR THERAPY:  #1 patient was originally seen in the multidisciplinary breast clinic when she found a left breast mass she eventually had MRI and mammograms performed. She was diagnosed with invasive ductal carcinoma that was ER positive PR positive HER-2/neu positive measuring 3.1 cm by MRI criteria. Ki-67 was 70% HER-2 was amplified with a ratio 2.91.  #2 patient was seen at the multidisciplinary breast clinic and a recommendation for neoadjuvant chemotherapy was recommended as patient was interested in breast conservation. She will receive neoadjuvant Taxotere carboplatinum Herceptin. Taxotere carboplatinum will be given every 21 days with weekly Herceptin. Total of 6 cycles of Taxotere and carboplatinum will be administered.  This was started 07/12/12.  3 patient had MRI of the breasts performed on 10/15/2012 which does show reduction in the size of the left breast mass from 3.1to 1.2. This is a wonderful response.  4. Patient begun on every 3 week herceptin beginning 11/29/12.  5. Patient underwent lumpectomy on 12/31/12 a 1.8 cm tumor was removed, the margins were 0.1cm and 1/1 sentinel nodes were positive disease.  A re-excision and axillary node dissection was recommended by Dr. Welton Flakes. An ALND occurred on 02/08/13 and 0/13 lymph nodes were positive for metastases.    6.  Radiation therapy with Dr. Dayton Scrape beginning on 9/15  CURRENT THERAPY:   Herceptin every 3 weeks  INTERVAL HISTORY:  Patient returns in followup visit prior to every 3 week Herceptin.  She's doing well today. She saw Dr. Gala Romney on September 8 and was cleared to continue Herceptin therapy.  She was recommended follow up  with him in January 2015.  She is tolerating the herceptin well and denies chest pain, DOE, palpitations, or any other concerns.     Past Medical History  Diagnosis Date  . Breast cancer   . Headache(784.0)   . Back pain   . 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 [place] fever   . Wears dentures     top  . Allergy   . Anemia   . Blood transfusion without reported diagnosis     2x s/p chemotherrapy     ALLERGIES:  is allergic to aspirin and iodinated diagnostic agents.  MEDICATIONS:  Current Outpatient Prescriptions  Medication Sig Dispense Refill  . albuterol (VENTOLIN HFA) 108 (90 BASE) MCG/ACT inhaler Inhale 2 puffs into the lungs every 6 (six) hours as needed.  1 Inhaler  3  . cetirizine (ZYRTEC) 10 MG tablet Take 10 mg by mouth daily as needed.       . fluticasone (FLONASE) 50 MCG/ACT nasal spray Place 2 sprays into the nose daily as needed.      Marland Kitchen HYDROcodone-acetaminophen (NORCO/VICODIN) 5-325 MG per tablet Take 1-2 tablets by mouth every 4 (four) hours as needed for pain.  30 tablet  0  . lidocaine-prilocaine (EMLA) cream Apply topically as needed.  30 g  7  . naproxen sodium (ANAPROX) 220 MG tablet Take 220 mg by mouth as needed.       Marland Kitchen omeprazole (PRILOSEC) 40 MG capsule TAKE 1 CAPSULE BY MOUTH DAILY  30 capsule  2   No current facility-administered medications for  this visit.    SURGICAL HISTORY:  Past Surgical History  Procedure Laterality Date  . 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 Cesarean section  1995/1996  . Lobectomy  1997    upper left  . Portacath placement  07/02/2012    Procedure: INSERTION PORT-A-CATH;  Surgeon: Mariella Saa, MD;  Location: [facility];  Service: General;  Laterality: N/A;  right  . Breast surgery  2006    cyst rt br-neg  . Breast lumpectomy with needle localization and axillary sentinel lymph node bx Left 12/31/2012    Procedure:  NEEDLE LOCALIZATION LEFT BREAST LUMPECTOMY AND LEFT AXILLARY SENTENIAL LYMPH NODE BX;  Surgeon: Mariella Saa, MD;  Location: [facility];  Service: General;  Laterality: Left;  . Axillary lymph node dissection Left 02/08/2013    Procedure: LEFT AXILLARY DISSECTION;  Surgeon: Mariella Saa, MD;  Location: [facility];  Service: General;  Laterality: Left;    REVIEW OF SYSTEMS:   A 10 point review of systems was conducted and is otherwise negative except for what is noted above.     PHYSICAL EXAMINATION: Blood pressure 102/68, pulse 73, temperature 98.1 F (36.7 C), temperature source Oral, resp. rate 18, height 5\' 2"  (1.575 m), weight 121 lb 1.6 oz (54.931 kg), SpO2 100.00%. Body mass index is 22.14 kg/(m^2). General: Patient is a well appearing female in no acute distress HEENT: PERRLA, sclerae anicteric no conjunctival pallor, MMM   Neck: supple, no palpable adenopathy Lungs: mild wheezing in bilateral lower lobes, otherwise clear to auscultation Cardiovascular: regular rate rhythm, S1, S2, no murmurs, rubs or gallops Abdomen: Soft, non-tender, non-distended, normoactive bowel sounds, no HSM Extremities: warm and well perfused, no clubbing, cyanosis, or edema Skin: No rashes or lesions Neuro: Non-focal Breasts: The right breast is unremarkable. Left breast lumpectomy site intact, well healed, slightly erythematous from radiation.   ECOG PERFORMANCE STATUS: 1  LABORATORY DATA: Lab Results  Component Value Date   WBC 6.7 04/24/2013   HGB 14.0 04/24/2013   HCT 40.7 04/24/2013   MCV 96.4 04/24/2013   PLT 200 04/24/2013      Chemistry      Component Value Date/Time   NA 140 04/24/2013 0846   NA 141 01/26/2011 1039   K 4.3 04/24/2013 0846   K 4.5 01/26/2011 1039   CL 100 12/20/2012 1509   CL 106 01/26/2011 1039   CO2 26 04/24/2013 0846   CO2 28 01/26/2011 1039   BUN 9.7 04/24/2013 0846   BUN 7 01/26/2011 1039   CREATININE 0.8 04/24/2013 0846    CREATININE 0.6 01/26/2011 1039      Component Value Date/Time   CALCIUM 9.8 04/24/2013 0846   CALCIUM 9.2 01/26/2011 1039   ALKPHOS 81 04/24/2013 0846   AST 18 04/24/2013 0846   ALT 13 04/24/2013 0846   BILITOT 0.22 04/24/2013 0846       RADIOGRAPHIC STUDIES: Dg Chest 2 View  07/30/2012  *RADIOLOGY REPORT*  Clinical Data: Bronchiectasis.  History of breast cancer.  CHEST - 2 VIEW  Comparison: 07/02/2012 and 05/03/2011  Findings: Power port in place.  There is now an ill-defined 16 mm in density in the left lung base which correlates with the previous cystic lesion seen on CT scan of 01/26/2011.  There is central lucency.  I suspect this represents retraction and wall thickening of the previously seen area of cystic bronchiectasis.  Scarring at the left  apex medially due to cystic bronchiectasis appears slightly less prominent.  The right lung is clear.  Heart size is normal.  Previous left upper lobectomy.  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  No other change since the prior exam.   Original Report Authenticated By: Francene Boyers, M.D.      ASSESSMENT: 47 year old [place] woman  with  #1 a clinical stage IIA (3.1 cm) invasive ductal carcinoma of the left breast status post needle core biopsy 06/05/2012 that showed ER positive PR positive HER-2/neu positive breast cancer with a Ki-67 at 70%.   #2 started chemotherapy on 07/13/2011 consisting of carboplatin, docetaxel and trastuzumab every 3 weeks, with the trastuzumab given weekly  She completed 6 cycles of this therapy.    #3 trastuzumab every 3 weeks to be continued to complete a year;   #4 Lumpectomy and Sentinel node biopsy on 12/31/12, sentinel nodes were positive.  She underwent ALND on 02/08/13 and 0/13 lymph nodes removed were positive.    PLAN:   1. Mrs. Gunia is doing well today.  She is tolerating herceptin without difficulty.  She will proceed with this  today.  I reviewed her labs with her in detail.    2. Patient will follow up with Dr. Gala Romney in January 2015 for an echocardiogram and final evaluation.    3. Patient will return to clinic in 3 weeks for her next herceptin dose.    All questions were answered. The patient knows to call the clinic with any problems, questions or concerns. We can certainly see the patient much sooner if necessary.   I spent 15 minutes counseling the patient face to face.  The total time spent in the appointment was 30 minutes.  Illa Level, NP Medical Oncology [facility] ([address]) [phone]

## 2013-04-24 NOTE — Telephone Encounter (Signed)
Per staff message and POF I have scheduled appts.  JMW  

## 2013-04-24 NOTE — Patient Instructions (Signed)
[facility] Discharge Instructions for Patients Receiving Chemotherapy  Today you received the following chemotherapy agents Herceptin.  To help prevent nausea and vomiting after your treatment, we encourage you to take your nausea medication as prescribed.   If you develop nausea and vomiting that is not controlled by your nausea medication, call the clinic.   BELOW ARE SYMPTOMS THAT SHOULD BE REPORTED IMMEDIATELY:  *FEVER GREATER THAN 100.5 F  *CHILLS WITH OR WITHOUT FEVER  NAUSEA AND VOMITING THAT IS NOT CONTROLLED WITH YOUR NAUSEA MEDICATION  *UNUSUAL SHORTNESS OF BREATH  *UNUSUAL BRUISING OR BLEEDING  TENDERNESS IN MOUTH AND THROAT WITH OR WITHOUT PRESENCE OF ULCERS  *URINARY PROBLEMS  *BOWEL PROBLEMS  UNUSUAL RASH Items with * indicate a potential emergency and should be followed up as soon as possible.  Feel free to call the clinic you have any questions or concerns. The clinic phone number is [phone].    

## 2013-04-24 NOTE — Telephone Encounter (Signed)
appts made and printed. Pt is aware that tx will follow her ov. i emailed MW to add the tx's...td

## 2013-04-25 ENCOUNTER — Ambulatory Visit
Admission: RE | Admit: 2013-04-25 | Discharge: 2013-04-25 | Disposition: A | Discharge status: Home / Self Care | Payer: 59 | Source: Ambulatory Visit | Attending: Radiation Oncology | Admitting: Radiation Oncology

## 2013-04-25 DIAGNOSIS — Z510 Encounter for antineoplastic radiation therapy: Secondary | ICD-10-CM | POA: Diagnosis not present

## 2013-04-26 ENCOUNTER — Ambulatory Visit
Admission: RE | Admit: 2013-04-26 | Discharge: 2013-04-26 | Disposition: A | Discharge status: Home / Self Care | Payer: 59 | Source: Ambulatory Visit | Attending: Radiation Oncology | Admitting: Radiation Oncology

## 2013-04-26 DIAGNOSIS — Z510 Encounter for antineoplastic radiation therapy: Secondary | ICD-10-CM | POA: Diagnosis not present

## 2013-04-29 ENCOUNTER — Ambulatory Visit
Admission: RE | Admit: 2013-04-29 | Discharge: 2013-04-29 | Disposition: A | Discharge status: Home / Self Care | Payer: 59 | Source: Ambulatory Visit | Attending: Radiation Oncology | Admitting: Radiation Oncology

## 2013-04-29 DIAGNOSIS — Z510 Encounter for antineoplastic radiation therapy: Secondary | ICD-10-CM | POA: Diagnosis not present

## 2013-04-30 ENCOUNTER — Ambulatory Visit
Admission: RE | Admit: 2013-04-30 | Discharge: 2013-04-30 | Disposition: A | Discharge status: Home / Self Care | Payer: 59 | Source: Ambulatory Visit | Attending: Radiation Oncology | Admitting: Radiation Oncology

## 2013-04-30 ENCOUNTER — Encounter: Payer: Self-pay | Admitting: Radiation Oncology

## 2013-04-30 VITALS — BP 101/68 | HR 71 | Temp 98.6°F | Resp 20 | Wt 119.9 lb

## 2013-04-30 DIAGNOSIS — C50212 Malignant neoplasm of upper-inner quadrant of left female breast: Secondary | ICD-10-CM

## 2013-04-30 DIAGNOSIS — Z510 Encounter for antineoplastic radiation therapy: Secondary | ICD-10-CM | POA: Diagnosis not present

## 2013-04-30 NOTE — Progress Notes (Signed)
Pt denies pain, loss of appetite. She is working full time, going to bed 1 hour earlier at night. She is applying Radiaplex to left breast treatment area for hyperpigmentation, denies itching.

## 2013-04-30 NOTE — Progress Notes (Signed)
Weekly Management Note:  Site: Left breast Current Dose:  5200  cGy Projected Dose: 6000  cGy  Narrative: The patient is seen today for routine under treatment assessment. CBCT/MVCT images/port films were reviewed. The chart was reviewed.   She is without complaints today. She uses Radioplex gel.  Physical Examination:  Filed Vitals:   04/30/13 0851  BP: 101/68  Pulse: 71  Temp: 98.6 F (37 C)  Resp: 20  .  Weight: 119 lb 14.4 oz (54.386 kg). There is mild erythema and hyperpigmentation but no areas of desquamation.  Impression: Tolerating radiation therapy well.  Plan: Continue radiation therapy as planned.

## 2013-05-01 ENCOUNTER — Ambulatory Visit
Admission: RE | Admit: 2013-05-01 | Discharge: 2013-05-01 | Disposition: A | Discharge status: Home / Self Care | Payer: 59 | Source: Ambulatory Visit | Attending: Radiation Oncology | Admitting: Radiation Oncology

## 2013-05-01 DIAGNOSIS — Z510 Encounter for antineoplastic radiation therapy: Secondary | ICD-10-CM | POA: Diagnosis not present

## 2013-05-02 ENCOUNTER — Ambulatory Visit
Admission: RE | Admit: 2013-05-02 | Discharge: 2013-05-02 | Disposition: A | Discharge status: Home / Self Care | Payer: 59 | Source: Ambulatory Visit | Attending: Radiation Oncology | Admitting: Radiation Oncology

## 2013-05-02 DIAGNOSIS — Z510 Encounter for antineoplastic radiation therapy: Secondary | ICD-10-CM | POA: Diagnosis not present

## 2013-05-03 ENCOUNTER — Ambulatory Visit
Admission: RE | Admit: 2013-05-03 | Discharge: 2013-05-03 | Disposition: A | Discharge status: Home / Self Care | Payer: 59 | Source: Ambulatory Visit | Attending: Radiation Oncology | Admitting: Radiation Oncology

## 2013-05-03 DIAGNOSIS — Z510 Encounter for antineoplastic radiation therapy: Secondary | ICD-10-CM | POA: Diagnosis not present

## 2013-05-06 ENCOUNTER — Ambulatory Visit
Admission: RE | Admit: 2013-05-06 | Discharge: 2013-05-06 | Disposition: A | Discharge status: Home / Self Care | Payer: 59 | Source: Ambulatory Visit | Attending: Radiation Oncology | Admitting: Radiation Oncology

## 2013-05-06 ENCOUNTER — Encounter: Payer: Self-pay | Admitting: Radiation Oncology

## 2013-05-06 VITALS — BP 114/62 | HR 90 | Temp 98.0°F | Resp 20 | Wt 119.3 lb

## 2013-05-06 DIAGNOSIS — C50212 Malignant neoplasm of upper-inner quadrant of left female breast: Secondary | ICD-10-CM

## 2013-05-06 DIAGNOSIS — Z510 Encounter for antineoplastic radiation therapy: Secondary | ICD-10-CM | POA: Diagnosis not present

## 2013-05-06 NOTE — Progress Notes (Signed)
Pt denies pain, fatigue, loss of appetite. She is applying Radiaplex to left breast treatment area for hyperpigmentation. Advised she complete Radiaplex then apply lotion with vitamin E. Pt completed treatment today and has 1 month FU scheduled.

## 2013-05-06 NOTE — Progress Notes (Signed)
[facility] Radiation Oncology End of Treatment Note  Name:Melissa [name]  Date: 05/06/2013 ZOX:[id number] DOB:Dec 05, 1965   Status:outpatient    CC: [name], MD  Dr. Jaclynn Guarneri  REFERRING PHYSICIAN:   Dr. 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   9 MEV electrons, left breast boost              NARRATIVE:  She tolerated treatment beautifully with only moderate hyperpigmentation and patchy dry desquamation the skin by completion of therapy. She uses Radioplex gel during her course of therapy.                          PLAN: Routine followup in one month. Patient instructed to call if questions or worsening complaints in interim.

## 2013-05-06 NOTE — Progress Notes (Signed)
Weekly Management Note:  Site: Left breast Current Dose:  5000  cGy Projected Dose: 5000  cGy  Narrative: The patient is seen today for routine under treatment assessment. CBCT/MVCT images/port films were reviewed. The chart was reviewed.   She finishes today. She continues with Radioplex gel. No complaints today.  Physical Examination:  Filed Vitals:   05/06/13 0827  BP: 114/62  Pulse: 90  Temp: 98 F (36.7 C)  Resp: 20  .  Weight: 119 lb 4.8 oz (54.114 kg). There is hyperpigmentation the skin with patchy dry desquamation along the left inframammary region and lower axilla. No areas of moist desquamation.  Impression: Radiation therapy completed. Treatment well tolerated.  Plan: Followup visit one month.

## 2013-05-07 ENCOUNTER — Ambulatory Visit: Payer: 59

## 2013-05-09 ENCOUNTER — Encounter (INDEPENDENT_AMBULATORY_CARE_PROVIDER_SITE_OTHER): Payer: 59 | Admitting: General Surgery

## 2013-05-15 ENCOUNTER — Ambulatory Visit (HOSPITAL_BASED_OUTPATIENT_CLINIC_OR_DEPARTMENT_OTHER): Payer: 59

## 2013-05-15 ENCOUNTER — Ambulatory Visit (HOSPITAL_BASED_OUTPATIENT_CLINIC_OR_DEPARTMENT_OTHER): Payer: 59 | Admitting: Adult Health

## 2013-05-15 ENCOUNTER — Encounter: Payer: Self-pay | Admitting: Adult Health

## 2013-05-15 ENCOUNTER — Other Ambulatory Visit (HOSPITAL_BASED_OUTPATIENT_CLINIC_OR_DEPARTMENT_OTHER): Payer: 59 | Admitting: Lab

## 2013-05-15 VITALS — BP 104/72 | HR 75 | Temp 98.4°F | Resp 20 | Ht 62.0 in | Wt 120.7 lb

## 2013-05-15 DIAGNOSIS — C50919 Malignant neoplasm of unspecified site of unspecified female breast: Secondary | ICD-10-CM

## 2013-05-15 DIAGNOSIS — C50219 Malignant neoplasm of upper-inner quadrant of unspecified female breast: Secondary | ICD-10-CM

## 2013-05-15 DIAGNOSIS — Z5112 Encounter for antineoplastic immunotherapy: Secondary | ICD-10-CM

## 2013-05-15 DIAGNOSIS — C50212 Malignant neoplasm of upper-inner quadrant of left female breast: Secondary | ICD-10-CM

## 2013-05-15 DIAGNOSIS — Z170 Estrogen receptor positive status [ER+]: Secondary | ICD-10-CM

## 2013-05-15 LAB — CBC WITH DIFFERENTIAL/PLATELET
BASO%: 0.7 % (ref 0.0–2.0)
Basophils Absolute: 0 10*3/uL (ref 0.0–0.1)
EOS%: 7.3 % — ABNORMAL HIGH (ref 0.0–7.0)
HCT: 38.9 % (ref 34.8–46.6)
HGB: 13.7 g/dL (ref 11.6–15.9)
LYMPH%: 33.3 % (ref 14.0–49.7)
MCH: 33.5 pg (ref 25.1–34.0)
MCHC: 35.2 g/dL (ref 31.5–36.0)
MONO#: 0.4 10*3/uL (ref 0.1–0.9)
NEUT#: 3.2 10*3/uL (ref 1.5–6.5)
NEUT%: 52.7 % (ref 38.4–76.8)
Platelets: 209 10*3/uL (ref 145–400)
RBC: 4.09 10*6/uL (ref 3.70–5.45)
RDW: 13 % (ref 11.2–14.5)
WBC: 6 10*3/uL (ref 3.9–10.3)
lymph#: 2 10*3/uL (ref 0.9–3.3)

## 2013-05-15 LAB — COMPREHENSIVE METABOLIC PANEL (CC13)
ALT: 17 U/L (ref 0–55)
AST: 18 U/L (ref 5–34)
Alkaline Phosphatase: 81 U/L (ref 40–150)
Creatinine: 0.8 mg/dL (ref 0.6–1.1)
Glucose: 91 mg/dl (ref 70–140)
Potassium: 3.9 mEq/L (ref 3.5–5.1)
Sodium: 139 mEq/L (ref 136–145)
Total Bilirubin: 0.26 mg/dL (ref 0.20–1.20)
Total Protein: 7.3 g/dL (ref 6.4–8.3)

## 2013-05-15 MED ORDER — ACETAMINOPHEN 325 MG PO TABS
ORAL_TABLET | ORAL | Status: AC
  Filled 2013-05-15: qty 2

## 2013-05-15 MED ORDER — DIPHENHYDRAMINE HCL 25 MG PO CAPS
50.0000 mg | ORAL_CAPSULE | Freq: Once | ORAL | Status: AC
  Administered 2013-05-15: 25 mg via ORAL

## 2013-05-15 MED ORDER — TRASTUZUMAB CHEMO INJECTION 440 MG
6.0000 mg/kg | Freq: Once | INTRAVENOUS | Status: AC
  Administered 2013-05-15: 336 mg via INTRAVENOUS
  Filled 2013-05-15: qty 16

## 2013-05-15 MED ORDER — SODIUM CHLORIDE 0.9 % IJ SOLN
10.0000 mL | INTRAMUSCULAR | Status: DC | PRN
  Administered 2013-05-15: 10 mL
  Filled 2013-05-15: qty 10

## 2013-05-15 MED ORDER — SODIUM CHLORIDE 0.9 % IV SOLN
Freq: Once | INTRAVENOUS | Status: AC
  Administered 2013-05-15: 12:00:00 via INTRAVENOUS

## 2013-05-15 MED ORDER — ACETAMINOPHEN 325 MG PO TABS
650.0000 mg | ORAL_TABLET | Freq: Once | ORAL | Status: AC
  Administered 2013-05-15: 650 mg via ORAL

## 2013-05-15 MED ORDER — HEPARIN SOD (PORK) LOCK FLUSH 100 UNIT/ML IV SOLN
500.0000 [IU] | Freq: Once | INTRAVENOUS | Status: AC | PRN
  Administered 2013-05-15: 500 [IU]
  Filled 2013-05-15: qty 5

## 2013-05-15 MED ORDER — DIPHENHYDRAMINE HCL 25 MG PO CAPS
ORAL_CAPSULE | ORAL | Status: AC
  Filled 2013-05-15: qty 1

## 2013-05-15 NOTE — Patient Instructions (Signed)

## 2013-05-15 NOTE — Progress Notes (Addendum)
OFFICE PROGRESS NOTE  CC  PCP: [name], MD   [name], MD [address] [place] [address]  DIAGNOSIS: 47 year old female with premenopausal ER positive PR positive HER-2/neu positive invasive ductal carcinoma of the left breast clinical stage IIA diagnosed in November 2013.  PRIOR THERAPY:  #1 patient was originally seen in the multidisciplinary breast clinic when she found a left breast mass she eventually had MRI and mammograms performed. She was diagnosed with invasive ductal carcinoma that was ER positive PR positive HER-2/neu positive measuring 3.1 cm by MRI criteria. Ki-67 was 70% HER-2 was amplified with a ratio 2.91.  #2 patient was seen at the multidisciplinary breast clinic and a recommendation for neoadjuvant chemotherapy was recommended as patient was interested in breast conservation. She will receive neoadjuvant Taxotere carboplatinum Herceptin. Taxotere carboplatinum will be given every 21 days with weekly Herceptin. Total of 6 cycles of Taxotere and carboplatinum will be administered.  This was started 07/12/12.  3 patient had MRI of the breasts performed on 10/15/2012 which does show reduction in the size of the left breast mass from 3.1to 1.2. This is a wonderful response.  4. Patient begun on every 3 week herceptin beginning 11/29/12.  5. Patient underwent lumpectomy on 12/31/12 a 1.8 cm tumor was removed, the margins were 0.1cm and 1/1 sentinel nodes were positive disease.  A re-excision and axillary node dissection was recommended by Dr. Welton Flakes. An ALND occurred on 02/08/13 and 0/13 lymph nodes were positive for metastases.    6.  Radiation therapy with Dr. Dayton Scrape from 9/15-10/27/14  CURRENT THERAPY:   Herceptin every 3 weeks  INTERVAL HISTORY:  Patient returns in followup visit prior to every 3 week Herceptin.  She continues to do well today.  She denies fevers, chills, nausea, vomiting, constipation, diarrhea, swelling, palpitations, or any  further concerns.    Past Medical History  Diagnosis Date  . Breast cancer   . Headache(784.0)   . Back pain   . 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 [place] fever   . Wears dentures     top  . Allergy   . Anemia   . Blood transfusion without reported diagnosis     2x s/p chemotherrapy     ALLERGIES:  is allergic to aspirin and iodinated diagnostic agents.  MEDICATIONS:  Current Outpatient Prescriptions  Medication Sig Dispense Refill  . albuterol (VENTOLIN HFA) 108 (90 BASE) MCG/ACT inhaler Inhale 2 puffs into the lungs every 6 (six) hours as needed.  1 Inhaler  3  . cetirizine (ZYRTEC) 10 MG tablet Take 10 mg by mouth daily as needed.       . fluticasone (FLONASE) 50 MCG/ACT nasal spray Place 2 sprays into the nose daily as needed.      Marland Kitchen HYDROcodone-acetaminophen (NORCO/VICODIN) 5-325 MG per tablet Take 1-2 tablets by mouth every 4 (four) hours as needed for pain.  30 tablet  0  . lidocaine-prilocaine (EMLA) cream Apply topically as needed.  30 g  7  . naproxen sodium (ANAPROX) 220 MG tablet Take 220 mg by mouth as needed.       Marland Kitchen omeprazole (PRILOSEC) 40 MG capsule TAKE 1 CAPSULE BY MOUTH DAILY  30 capsule  2   No current facility-administered medications for this visit.   Facility-Administered Medications Ordered in Other Visits  Medication Dose Route Frequency Provider Last Rate Last Dose  . sodium chloride 0.9 % injection  10 mL  10 mL Intracatheter PRN Victorino December, MD   10 mL at 05/15/13 1321    SURGICAL HISTORY:  Past Surgical History  Procedure Laterality Date  . 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 Cesarean section  1995/1996  . Lobectomy  1997    upper left  . Portacath placement  07/02/2012    Procedure: INSERTION PORT-A-CATH;  Surgeon: Mariella Saa, MD;  Location: [facility];  Service: General;  Laterality: N/A;  right  . Breast surgery  2006    cyst  rt br-neg  . Breast lumpectomy with needle localization and axillary sentinel lymph node bx Left 12/31/2012    Procedure: NEEDLE LOCALIZATION LEFT BREAST LUMPECTOMY AND LEFT AXILLARY SENTENIAL LYMPH NODE BX;  Surgeon: Mariella Saa, MD;  Location: [facility];  Service: General;  Laterality: Left;  . Axillary lymph node dissection Left 02/08/2013    Procedure: LEFT AXILLARY DISSECTION;  Surgeon: Mariella Saa, MD;  Location: [facility];  Service: General;  Laterality: Left;    REVIEW OF SYSTEMS:   A 10 point review of systems was conducted and is otherwise negative except for what is noted above.     PHYSICAL EXAMINATION: Blood pressure 104/72, pulse 75, temperature 98.4 F (36.9 C), temperature source Oral, resp. rate 20, height 5\' 2"  (1.575 m), weight 120 lb 11.2 oz (54.749 kg). Body mass index is 22.07 kg/(m^2). General: Patient is a well appearing female in no acute distress HEENT: PERRLA, sclerae anicteric no conjunctival pallor, MMM   Neck: supple, no palpable adenopathy Lungs: mild wheezing in bilateral lower lobes, otherwise clear to auscultation Cardiovascular: regular rate rhythm, S1, S2, no murmurs, rubs or gallops Abdomen: Soft, non-tender, non-distended, normoactive bowel sounds, no HSM Extremities: warm and well perfused, no clubbing, cyanosis, or edema Skin: No rashes or lesions Neuro: Non-focal Breasts: deferred   ECOG PERFORMANCE STATUS: 1  LABORATORY DATA: Lab Results  Component Value Date   WBC 6.0 05/15/2013   HGB 13.7 05/15/2013   HCT 38.9 05/15/2013   MCV 95.1 05/15/2013   PLT 209 05/15/2013      Chemistry      Component Value Date/Time   NA 139 05/15/2013 1029   NA 141 01/26/2011 1039   K 3.9 05/15/2013 1029   K 4.5 01/26/2011 1039   CL 100 12/20/2012 1509   CL 106 01/26/2011 1039   CO2 24 05/15/2013 1029   CO2 28 01/26/2011 1039   BUN 8.7 05/15/2013 1029   BUN 7 01/26/2011 1039   CREATININE 0.8 05/15/2013 1029    CREATININE 0.6 01/26/2011 1039      Component Value Date/Time   CALCIUM 10.0 05/15/2013 1029   CALCIUM 9.2 01/26/2011 1039   ALKPHOS 81 05/15/2013 1029   AST 18 05/15/2013 1029   ALT 17 05/15/2013 1029   BILITOT 0.26 05/15/2013 1029       RADIOGRAPHIC STUDIES: Dg Chest 2 View  07/30/2012  *RADIOLOGY REPORT*  Clinical Data: Bronchiectasis.  History of breast cancer.  CHEST - 2 VIEW  Comparison: 07/02/2012 and 05/03/2011  Findings: Power port in place.  There is now an ill-defined 16 mm in density in the left lung base which correlates with the previous cystic lesion seen on CT scan of 01/26/2011.  There is central lucency.  I suspect this represents retraction and wall thickening of the previously seen area of cystic bronchiectasis.  Scarring at the left apex  medially due to cystic bronchiectasis appears slightly less prominent.  The right lung is clear.  Heart size is normal.  Previous left upper lobectomy.  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  No other change since the prior exam.   Original Report Authenticated By: Francene Boyers, M.D.      ASSESSMENT: 47 year old [place] woman  with  #1 a clinical stage IIA (3.1 cm) invasive ductal carcinoma of the left breast status post needle core biopsy 06/05/2012 that showed ER positive PR positive HER-2/neu positive breast cancer with a Ki-67 at 70%.   #2 started chemotherapy on 07/13/2011 consisting of carboplatin, docetaxel and trastuzumab every 3 weeks, with the trastuzumab given weekly  She completed 6 cycles of this therapy.    #3 trastuzumab every 3 weeks to be continued to complete a year;   #4 Lumpectomy and Sentinel node biopsy on 12/31/12, sentinel nodes were positive.  She underwent ALND on 02/08/13 and 0/13 lymph nodes removed were positive.    #5 Patient completed radiation therapy on 05/06/13.   PLAN:   1. Mrs. Starrett is doing well today.  She is tolerating herceptin  without difficulty.  She will proceed with this today.  I reviewed her labs with her in detail.  They are normal.    2. Patient will follow up with Dr. Gala Romney in January 2015 for an echocardiogram and final evaluation.    3. Patient will return to clinic in 3 weeks for her next herceptin dose.  At this time we will further discuss anti-estrogen therapy.  She received information regarding this today.    All questions were answered. The patient knows to call the clinic with any problems, questions or concerns. We can certainly see the patient much sooner if necessary.   I spent 25 minutes counseling the patient face to face.  The total time spent in the appointment was 30 minutes.  Illa Level, NP Medical Oncology [facility] [phone]  ATTENDING'S ATTESTATION:  I personally reviewed patient's chart, examined patient myself, formulated the treatment plan as followed.    Overall patient is doing well. She is on Herceptin every 3 weeks tolerating it well. She is followed by cardiology for ongoing surveillance for cardiac toxicity from Herceptin. She eventually we will need to begin antiestrogen therapy. Was given formation regarding this. Since she is premenopausal she would be a good candidate for tamoxifen.  We will see the patient back in 3 weeks' time for followup  Drue Second, MD Medical/Oncology [facility] [phone] (beeper) [phone] (Office)  05/17/2013, 5:03 PM

## 2013-05-15 NOTE — Patient Instructions (Signed)
[facility] Discharge Instructions for Patients Receiving Chemotherapy  Today you received the following chemotherapy agent: Herceptin   To help prevent nausea and vomiting after your treatment, we encourage you to take your nausea medication as prescribed.    If you develop nausea and vomiting that is not controlled by your nausea medication, call the clinic.   BELOW ARE SYMPTOMS THAT SHOULD BE REPORTED IMMEDIATELY:  *FEVER GREATER THAN 100.5 F  *CHILLS WITH OR WITHOUT FEVER  NAUSEA AND VOMITING THAT IS NOT CONTROLLED WITH YOUR NAUSEA MEDICATION  *UNUSUAL SHORTNESS OF BREATH  *UNUSUAL BRUISING OR BLEEDING  TENDERNESS IN MOUTH AND THROAT WITH OR WITHOUT PRESENCE OF ULCERS  *URINARY PROBLEMS  *BOWEL PROBLEMS  UNUSUAL RASH Items with * indicate a potential emergency and should be followed up as soon as possible.  Feel free to call the clinic you have any questions or concerns. The clinic phone number is [phone].    

## 2013-05-16 ENCOUNTER — Other Ambulatory Visit: Payer: Self-pay

## 2013-06-05 ENCOUNTER — Telehealth: Payer: Self-pay | Admitting: *Deleted

## 2013-06-05 ENCOUNTER — Ambulatory Visit (HOSPITAL_BASED_OUTPATIENT_CLINIC_OR_DEPARTMENT_OTHER): Payer: 59

## 2013-06-05 ENCOUNTER — Ambulatory Visit (HOSPITAL_BASED_OUTPATIENT_CLINIC_OR_DEPARTMENT_OTHER): Payer: 59 | Admitting: Adult Health

## 2013-06-05 ENCOUNTER — Encounter: Payer: Self-pay | Admitting: Adult Health

## 2013-06-05 ENCOUNTER — Other Ambulatory Visit (HOSPITAL_BASED_OUTPATIENT_CLINIC_OR_DEPARTMENT_OTHER): Payer: 59 | Admitting: Lab

## 2013-06-05 VITALS — BP 101/67 | HR 71 | Temp 98.3°F | Resp 18 | Ht 62.0 in | Wt 121.9 lb

## 2013-06-05 DIAGNOSIS — C50919 Malignant neoplasm of unspecified site of unspecified female breast: Secondary | ICD-10-CM

## 2013-06-05 DIAGNOSIS — Z170 Estrogen receptor positive status [ER+]: Secondary | ICD-10-CM

## 2013-06-05 DIAGNOSIS — Z923 Personal history of irradiation: Secondary | ICD-10-CM | POA: Insufficient documentation

## 2013-06-05 DIAGNOSIS — Z5112 Encounter for antineoplastic immunotherapy: Secondary | ICD-10-CM

## 2013-06-05 DIAGNOSIS — C50212 Malignant neoplasm of upper-inner quadrant of left female breast: Secondary | ICD-10-CM

## 2013-06-05 DIAGNOSIS — C50219 Malignant neoplasm of upper-inner quadrant of unspecified female breast: Secondary | ICD-10-CM

## 2013-06-05 LAB — CBC WITH DIFFERENTIAL/PLATELET
BASO%: 0.4 % (ref 0.0–2.0)
Eosinophils Absolute: 0.5 10*3/uL (ref 0.0–0.5)
HGB: 13.4 g/dL (ref 11.6–15.9)
LYMPH%: 24.5 % (ref 14.0–49.7)
MCHC: 35.1 g/dL (ref 31.5–36.0)
MCV: 96.5 fL (ref 79.5–101.0)
MONO#: 0.6 10*3/uL (ref 0.1–0.9)
MONO%: 7.9 % (ref 0.0–14.0)
NEUT#: 4.4 10*3/uL (ref 1.5–6.5)
Platelets: 223 10*3/uL (ref 145–400)
RBC: 3.96 10*6/uL (ref 3.70–5.45)
RDW: 13 % (ref 11.2–14.5)
WBC: 7.2 10*3/uL (ref 3.9–10.3)
lymph#: 1.8 10*3/uL (ref 0.9–3.3)
nRBC: 0 % (ref 0–0)

## 2013-06-05 LAB — COMPREHENSIVE METABOLIC PANEL (CC13)
ALT: 16 U/L (ref 0–55)
AST: 17 U/L (ref 5–34)
Alkaline Phosphatase: 80 U/L (ref 40–150)
Chloride: 107 mEq/L (ref 98–109)
Creatinine: 0.8 mg/dL (ref 0.6–1.1)
Sodium: 141 mEq/L (ref 136–145)
Total Bilirubin: 0.24 mg/dL (ref 0.20–1.20)
Total Protein: 7.4 g/dL (ref 6.4–8.3)

## 2013-06-05 MED ORDER — TAMOXIFEN CITRATE 20 MG PO TABS: 20.0000 mg | ORAL_TABLET | Freq: Every day | ORAL | Status: DC

## 2013-06-05 MED ORDER — ACETAMINOPHEN 325 MG PO TABS
650.0000 mg | ORAL_TABLET | Freq: Once | ORAL | Status: AC
  Administered 2013-06-05: 650 mg via ORAL

## 2013-06-05 MED ORDER — DIPHENHYDRAMINE HCL 25 MG PO CAPS
50.0000 mg | ORAL_CAPSULE | Freq: Once | ORAL | Status: AC
  Administered 2013-06-05: 25 mg via ORAL

## 2013-06-05 MED ORDER — ACETAMINOPHEN 325 MG PO TABS
ORAL_TABLET | ORAL | Status: AC
  Filled 2013-06-05: qty 2

## 2013-06-05 MED ORDER — DIPHENHYDRAMINE HCL 25 MG PO CAPS
ORAL_CAPSULE | ORAL | Status: AC
  Filled 2013-06-05: qty 1

## 2013-06-05 MED ORDER — SODIUM CHLORIDE 0.9 % IV SOLN
6.0000 mg/kg | Freq: Once | INTRAVENOUS | Status: AC
  Administered 2013-06-05: 336 mg via INTRAVENOUS
  Filled 2013-06-05: qty 16

## 2013-06-05 MED ORDER — SODIUM CHLORIDE 0.9 % IJ SOLN
10.0000 mL | INTRAMUSCULAR | Status: DC | PRN
  Filled 2013-06-05: qty 10

## 2013-06-05 MED ORDER — HEPARIN SOD (PORK) LOCK FLUSH 100 UNIT/ML IV SOLN
500.0000 [IU] | Freq: Once | INTRAVENOUS | Status: DC | PRN
  Filled 2013-06-05: qty 5

## 2013-06-05 MED ORDER — SODIUM CHLORIDE 0.9 % IV SOLN
Freq: Once | INTRAVENOUS | Status: AC
  Administered 2013-06-05: 10:00:00 via INTRAVENOUS

## 2013-06-05 NOTE — Patient Instructions (Signed)

## 2013-06-05 NOTE — Patient Instructions (Signed)
[facility] Discharge Instructions for Patients Receiving Chemotherapy  Today you received the following chemotherapy agents Herceptin  To help prevent nausea and vomiting after your treatment, we encourage you to take your nausea medication     If you develop nausea and vomiting that is not controlled by your nausea medication, call the clinic.   BELOW ARE SYMPTOMS THAT SHOULD BE REPORTED IMMEDIATELY:  *FEVER GREATER THAN 100.5 F  *CHILLS WITH OR WITHOUT FEVER  NAUSEA AND VOMITING THAT IS NOT CONTROLLED WITH YOUR NAUSEA MEDICATION  *UNUSUAL SHORTNESS OF BREATH  *UNUSUAL BRUISING OR BLEEDING  TENDERNESS IN MOUTH AND THROAT WITH OR WITHOUT PRESENCE OF ULCERS  *URINARY PROBLEMS  *BOWEL PROBLEMS  UNUSUAL RASH Items with * indicate a potential emergency and should be followed up as soon as possible.  Feel free to call the clinic you have any questions or concerns. The clinic phone number is [phone].    

## 2013-06-05 NOTE — Telephone Encounter (Signed)
appts made and printed. Pt is aware that tx's will be added. i emailed MW to add the tx's...td 

## 2013-06-05 NOTE — Progress Notes (Signed)
OFFICE PROGRESS NOTE  CC  PCP: [name], MD   [name], MD 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  PRIOR THERAPY:  #1 patient was originally seen in the multidisciplinary breast clinic when she found a left breast mass she eventually had MRI and mammograms performed. She was diagnosed with invasive ductal carcinoma that was ER positive PR positive HER-2/neu positive measuring 3.1 cm by MRI criteria. Ki-67 was 70% HER-2 was amplified with a ratio 2.91.  #2 patient was seen at the multidisciplinary breast clinic and a recommendation for neoadjuvant chemotherapy was recommended as patient was interested in breast conservation. She will receive neoadjuvant Taxotere carboplatinum Herceptin. Taxotere carboplatinum will be given every 21 days with weekly Herceptin. Total of 6 cycles of Taxotere and carboplatinum will be administered.  This was started 07/12/12.  3 patient had MRI of the breasts performed on 10/15/2012 which does show reduction in the size of the left breast mass from 3.1to 1.2. This is a wonderful response.  4. Patient begun on every 3 week herceptin beginning 11/29/12.  5. Patient underwent lumpectomy on 12/31/12 a 1.8 cm tumor was removed, the margins were 0.1cm and 1/1 sentinel nodes were positive disease.  A re-excision and axillary node dissection was recommended by Dr. Welton Flakes. An ALND occurred on 02/08/13 and 0/13 lymph nodes were positive for metastases.    6.  Radiation therapy with Dr. Dayton Scrape from 9/15-10/27/14  7. Tamoxifen started on 06/05/13  CURRENT THERAPY:   Herceptin every 3 weeks, daily Tamoxifen  INTERVAL HISTORY:  Patient returns in followup visit prior to every 3 week Herceptin.  She is doing well today.  She is experiencing pain under her left arm and in her  breast from her previous surgery.  She has no redness or swelling in this area.  Otherwise, she denies fevers, chills, nausea, vomiting, DOE, PND, orthopnea or any other concerns.    Past Medical History  Diagnosis Date  . Breast cancer   . Headache(784.0)   . Back pain   . 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 [place] fever   . Wears dentures     top  . Allergy   . Anemia   . Blood transfusion without reported diagnosis     2x s/p chemotherrapy     ALLERGIES:  is allergic to aspirin and iodinated diagnostic agents.  MEDICATIONS:  Current Outpatient Prescriptions  Medication Sig Dispense Refill  . albuterol (VENTOLIN HFA) 108 (90 BASE) MCG/ACT inhaler Inhale 2 puffs into the lungs every 6 (six) hours as needed.  1 Inhaler  3  . fluticasone (FLONASE) 50 MCG/ACT nasal spray Place 2 sprays into the nose daily as needed.      . lidocaine-prilocaine (EMLA) cream Apply topically as needed.  30 g  7  . naproxen sodium (ANAPROX) 220 MG tablet Take 220 mg by mouth as needed.       . cetirizine (ZYRTEC) 10 MG tablet Take 10 mg by mouth daily as needed.       Marland Kitchen omeprazole (PRILOSEC) 40 MG capsule TAKE 1 CAPSULE BY MOUTH DAILY  30 capsule  2  . tamoxifen (NOLVADEX) 20 MG tablet Take 1 tablet (20 mg total) by mouth daily.  30 tablet  6   No current facility-administered  medications for this visit.    SURGICAL HISTORY:  Past Surgical History  Procedure Laterality Date  . 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 Cesarean section  1995/1996  . Lobectomy  1997    upper left  . Portacath placement  07/02/2012    Procedure: INSERTION PORT-A-CATH;  Surgeon: Mariella Saa, MD;  Location: [facility];  Service: General;  Laterality: N/A;  right  . Breast surgery  2006    cyst rt br-neg  . Breast lumpectomy with needle localization and axillary sentinel lymph node bx Left 12/31/2012    Procedure:  NEEDLE LOCALIZATION LEFT BREAST LUMPECTOMY AND LEFT AXILLARY SENTENIAL LYMPH NODE BX;  Surgeon: Mariella Saa, MD;  Location: [facility];  Service: General;  Laterality: Left;  . Axillary lymph node dissection Left 02/08/2013    Procedure: LEFT AXILLARY DISSECTION;  Surgeon: Mariella Saa, MD;  Location: [facility];  Service: General;  Laterality: Left;    REVIEW OF SYSTEMS:   A 10 point review of systems was conducted and is otherwise negative except for what is noted above.     PHYSICAL EXAMINATION: Blood pressure 101/67, pulse 71, temperature 98.3 F (36.8 C), temperature source Oral, resp. rate 18, height 5\' 2"  (1.575 m), weight 121 lb 14.4 oz (55.293 kg). Body mass index is 22.29 kg/(m^2). General: Patient is a well appearing female in no acute distress HEENT: PERRLA, sclerae anicteric no conjunctival pallor, MMM   Neck: supple, no palpable adenopathy Lungs: clear to auscultation, no wheezes, rhonchi or rales Cardiovascular: regular rate rhythm, S1, S2, no murmurs, rubs or gallops Abdomen: Soft, non-tender, non-distended, normoactive bowel sounds, no HSM Extremities: warm and well perfused, no clubbing, cyanosis, or edema Skin: No rashes or lesions Neuro: Non-focal Breasts: deferred   ECOG PERFORMANCE STATUS: 1  LABORATORY DATA: Lab Results  Component Value Date   WBC 7.2 06/05/2013   HGB 13.4 06/05/2013   HCT 38.2 06/05/2013   MCV 96.5 06/05/2013   PLT 223 06/05/2013      Chemistry      Component Value Date/Time   NA 139 05/15/2013 1029   NA 141 01/26/2011 1039   K 3.9 05/15/2013 1029   K 4.5 01/26/2011 1039   CL 100 12/20/2012 1509   CL 106 01/26/2011 1039   CO2 24 05/15/2013 1029   CO2 28 01/26/2011 1039   BUN 8.7 05/15/2013 1029   BUN 7 01/26/2011 1039   CREATININE 0.8 05/15/2013 1029   CREATININE 0.6 01/26/2011 1039      Component Value Date/Time   CALCIUM 10.0 05/15/2013 1029   CALCIUM 9.2 01/26/2011 1039   ALKPHOS 81 05/15/2013  1029   AST 18 05/15/2013 1029   ALT 17 05/15/2013 1029   BILITOT 0.26 05/15/2013 1029       RADIOGRAPHIC STUDIES: Dg Chest 2 View  07/30/2012  *RADIOLOGY REPORT*  Clinical Data: Bronchiectasis.  History of breast cancer.  CHEST - 2 VIEW  Comparison: 07/02/2012 and 05/03/2011  Findings: Power port in place.  There is now an ill-defined 16 mm in density in the left lung base which correlates with the previous cystic lesion seen on CT scan of 01/26/2011.  There is central lucency.  I suspect this represents retraction and wall thickening of the previously seen area of cystic bronchiectasis.  Scarring at the left apex medially due to cystic bronchiectasis appears slightly less prominent.  The right lung is clear.  Heart size is normal.  Previous left upper lobectomy.  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  No other change since the prior exam.   Original Report Authenticated By: Francene Boyers, M.D.      ASSESSMENT: 47 year old [place] woman  with  #1 a clinical stage IIA (3.1 cm) invasive ductal carcinoma of the left breast status post needle core biopsy 06/05/2012 that showed ER positive PR positive HER-2/neu positive breast cancer with a Ki-67 at 70%.   #2 started chemotherapy on 07/13/2011 consisting of carboplatin, docetaxel and trastuzumab every 3 weeks, with the trastuzumab given weekly  She completed 6 cycles of this therapy.    #3 trastuzumab every 3 weeks to be continued to complete a year;   #4 Lumpectomy and Sentinel node biopsy on 12/31/12, sentinel nodes were positive.  She underwent ALND on 02/08/13 and 0/13 lymph nodes removed were positive.    #5 Patient completed radiation therapy on 05/06/13, and started Tamoxifen therapy on 06/05/13.  PLAN:   1. Mrs. Windom is doing well today.  She is tolerating herceptin without difficulty.  She will proceed with this today.  I reviewed her CBC with her in detail.  It is  normal.    2. Patient will follow up with Dr. Gala Romney in January 2015 for an echocardiogram and final evaluation.    3. Patient will return to clinic in 3 weeks for her next herceptin dose.    4.  We discussed Tamoxifen again at length, and I reviewed her pathology with her again.  We discussed risks and benefits.  She knows to keep annual gynecologic exams, eye exams, and have her cholesterol checked.  She knows to inform us immediately if she develops swelling in any extremity or abnormal menstruation.  She will start this today.  She received detailed information in her AVS.    All questions were answered. The patient knows to call the clinic with any problems, questions or concerns. We can certainly see the patient much sooner if necessary.   I spent 25 minutes counseling the patient face to face.  The total time spent in the appointment was 30 minutes.  Illa Level, NP Medical Oncology [facility] [phone] 06/05/2013, 9:12 AM

## 2013-06-07 ENCOUNTER — Telehealth: Payer: Self-pay | Admitting: *Deleted

## 2013-06-07 NOTE — Telephone Encounter (Signed)
Per staff message and POF I have scheduled appts.  JMW  

## 2013-06-11 ENCOUNTER — Ambulatory Visit
Admission: RE | Admit: 2013-06-11 | Discharge: 2013-06-11 | Disposition: A | Discharge status: Home / Self Care | Payer: 59 | Source: Ambulatory Visit | Attending: Radiation Oncology | Admitting: Radiation Oncology

## 2013-06-11 VITALS — Temp 98.2°F | Wt 121.9 lb

## 2013-06-11 DIAGNOSIS — C50212 Malignant neoplasm of upper-inner quadrant of left female breast: Secondary | ICD-10-CM

## 2013-06-11 NOTE — Progress Notes (Signed)
CC: Dr. Sigmund Hazel, Dr. Jaclynn Guarneri, Dr. 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 She is without complaints today although she continues to have hot flashes. She also reports occasional left shoulder discomfort with shoulder extension. She started tamoxifen yesterday and had difficulty sleeping. She sees Dr. Welton Flakes for a followup visit on December 17, Dr. Johna Sheriff on January 2. She continues to smoke.  Physical examination: Alert and oriented. Filed Vitals:   06/11/13 1133  Temp: 98.2 F (36.8 C)   Head and neck examination: Grossly unremarkable. Nodes: Without palpable cervical, supraclavicular, or axillary lymphadenopathy. Chest: There few scattered rhonchi throughout both lung zones. Breasts: There is mild thickening of the left breast, no dominant masses are appreciated. Right breast without masses or lesions. Extremities: Without edema.  Impression: Satisfactory progress  Plan: Followup as above with Dr. Johna Sheriff and Dr. Welton Flakes. I think she can have a baseline left breast mammogram sometime later this spring or summer. This may be scheduled by Dr. Johna Sheriff or Dr. Welton Flakes. I've not scheduled the patient for a formal followup visit. I encouraged her to contact her primary care physician for smoking cessation

## 2013-06-11 NOTE — Progress Notes (Signed)
Patient here for one month post completion of radiation to left breast.Mild discoloration of skin, still applying lotion to this area.Started tamoxifen on 06/10/13.Occasional left breast discomfort not requiring pain medication.On herceptin every 3 weeks with completion time in February 2015.

## 2013-06-12 ENCOUNTER — Telehealth: Payer: Self-pay | Admitting: Oncology

## 2013-06-12 NOTE — Telephone Encounter (Signed)
Talked to pt, she is aware of appt on 12/17th, lab ,md and chemo

## 2013-06-21 ENCOUNTER — Encounter: Payer: Self-pay | Admitting: Oncology

## 2013-06-21 NOTE — Progress Notes (Signed)
Pt is approved thru [employer] for a copay card for Herceptin.  Benefit amount is $24,000 from 06/11/13 - 06/10/14.  I will forward copy of letter to the billing dept.

## 2013-06-26 ENCOUNTER — Encounter: Payer: Self-pay | Admitting: Adult Health

## 2013-06-26 ENCOUNTER — Encounter: Payer: Self-pay | Admitting: Oncology

## 2013-06-26 ENCOUNTER — Other Ambulatory Visit (HOSPITAL_BASED_OUTPATIENT_CLINIC_OR_DEPARTMENT_OTHER): Payer: 59

## 2013-06-26 ENCOUNTER — Ambulatory Visit (HOSPITAL_BASED_OUTPATIENT_CLINIC_OR_DEPARTMENT_OTHER): Payer: 59 | Admitting: Adult Health

## 2013-06-26 ENCOUNTER — Ambulatory Visit (HOSPITAL_BASED_OUTPATIENT_CLINIC_OR_DEPARTMENT_OTHER): Payer: 59

## 2013-06-26 VITALS — BP 108/55 | HR 73 | Temp 98.0°F | Resp 20 | Ht 62.0 in | Wt 120.2 lb

## 2013-06-26 DIAGNOSIS — C50919 Malignant neoplasm of unspecified site of unspecified female breast: Secondary | ICD-10-CM

## 2013-06-26 DIAGNOSIS — C50219 Malignant neoplasm of upper-inner quadrant of unspecified female breast: Secondary | ICD-10-CM

## 2013-06-26 DIAGNOSIS — C50212 Malignant neoplasm of upper-inner quadrant of left female breast: Secondary | ICD-10-CM

## 2013-06-26 DIAGNOSIS — Z5112 Encounter for antineoplastic immunotherapy: Secondary | ICD-10-CM

## 2013-06-26 DIAGNOSIS — Z170 Estrogen receptor positive status [ER+]: Secondary | ICD-10-CM

## 2013-06-26 LAB — CBC WITH DIFFERENTIAL/PLATELET
Basophils Absolute: 0.1 10*3/uL (ref 0.0–0.1)
EOS%: 6.3 % (ref 0.0–7.0)
Eosinophils Absolute: 0.3 10*3/uL (ref 0.0–0.5)
HCT: 36.4 % (ref 34.8–46.6)
HGB: 12.7 g/dL (ref 11.6–15.9)
MCH: 33.6 pg (ref 25.1–34.0)
MCV: 96.3 fL (ref 79.5–101.0)
MONO%: 9 % (ref 0.0–14.0)
NEUT#: 2.9 10*3/uL (ref 1.5–6.5)
NEUT%: 56 % (ref 38.4–76.8)
lymph#: 1.5 10*3/uL (ref 0.9–3.3)

## 2013-06-26 LAB — COMPREHENSIVE METABOLIC PANEL (CC13)
ALT: 13 U/L (ref 0–55)
AST: 17 U/L (ref 5–34)
Albumin: 3.8 g/dL (ref 3.5–5.0)
BUN: 10.4 mg/dL (ref 7.0–26.0)
Calcium: 9.7 mg/dL (ref 8.4–10.4)
Chloride: 107 mEq/L (ref 98–109)
Creatinine: 0.8 mg/dL (ref 0.6–1.1)
Glucose: 81 mg/dl (ref 70–140)
Potassium: 4.2 mEq/L (ref 3.5–5.1)

## 2013-06-26 MED ORDER — ACETAMINOPHEN 325 MG PO TABS
ORAL_TABLET | ORAL | Status: AC
  Filled 2013-06-26: qty 2

## 2013-06-26 MED ORDER — HEPARIN SOD (PORK) LOCK FLUSH 100 UNIT/ML IV SOLN
500.0000 [IU] | Freq: Once | INTRAVENOUS | Status: AC | PRN
  Administered 2013-06-26: 500 [IU]
  Filled 2013-06-26: qty 5

## 2013-06-26 MED ORDER — TRASTUZUMAB CHEMO INJECTION 440 MG
6.0000 mg/kg | Freq: Once | INTRAVENOUS | Status: AC
  Administered 2013-06-26: 336 mg via INTRAVENOUS
  Filled 2013-06-26: qty 16

## 2013-06-26 MED ORDER — SODIUM CHLORIDE 0.9 % IJ SOLN
10.0000 mL | INTRAMUSCULAR | Status: DC | PRN
  Administered 2013-06-26: 10 mL
  Filled 2013-06-26: qty 10

## 2013-06-26 MED ORDER — DIPHENHYDRAMINE HCL 25 MG PO CAPS
50.0000 mg | ORAL_CAPSULE | Freq: Once | ORAL | Status: AC
  Administered 2013-06-26: 25 mg via ORAL

## 2013-06-26 MED ORDER — DIPHENHYDRAMINE HCL 25 MG PO CAPS
ORAL_CAPSULE | ORAL | Status: AC
  Filled 2013-06-26: qty 1

## 2013-06-26 MED ORDER — ACETAMINOPHEN 325 MG PO TABS
650.0000 mg | ORAL_TABLET | Freq: Once | ORAL | Status: AC
  Administered 2013-06-26: 650 mg via ORAL

## 2013-06-26 MED ORDER — SODIUM CHLORIDE 0.9 % IV SOLN
Freq: Once | INTRAVENOUS | Status: AC
  Administered 2013-06-26: 10:00:00 via INTRAVENOUS

## 2013-06-26 NOTE — Progress Notes (Signed)
OFFICE PROGRESS NOTE  CC  PCP: [name], MD   [name], MD 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  PRIOR THERAPY:  #1 patient was originally seen in the multidisciplinary breast clinic when she found a left breast mass she eventually had MRI and mammograms performed. She was diagnosed with invasive ductal carcinoma that was ER positive PR positive HER-2/neu positive measuring 3.1 cm by MRI criteria. Ki-67 was 70% HER-2 was amplified with a ratio 2.91.  #2 patient was seen at the multidisciplinary breast clinic and a recommendation for neoadjuvant chemotherapy was recommended as patient was interested in breast conservation. She will receive neoadjuvant Taxotere carboplatinum Herceptin. Taxotere carboplatinum will be given every 21 days with weekly Herceptin. Total of 6 cycles of Taxotere and carboplatinum will be administered.  This was started 07/12/12.  3 patient had MRI of the breasts performed on 10/15/2012 which does show reduction in the size of the left breast mass from 3.1to 1.2. This is a wonderful response.  4. Patient begun on every 3 week herceptin beginning 11/29/12.  5. Patient underwent lumpectomy on 12/31/12 a 1.8 cm tumor was removed, the margins were 0.1cm and 1/1 sentinel nodes were positive disease.  A re-excision and axillary node dissection was recommended by Dr. Welton Flakes. An ALND occurred on 02/08/13 and 0/13 lymph nodes were positive for metastases.    6.  Radiation therapy with Dr. Dayton Scrape from 9/15-10/27/14  7. Tamoxifen started on 06/05/13  CURRENT THERAPY:   Herceptin every 3 weeks, daily Tamoxifen  INTERVAL HISTORY:  Patient returns in followup visit prior to every 3 week Herceptin.  She is doing well today.  She started Tamoxifen last month, and other than having hot  flashes she is tolerating it without difficulty.  She denies fevers, chills, nausea, vomiting, palpitations, shortness of breath, DOE, orthopnea, or any further concerns.     Past Medical History  Diagnosis Date  . Breast cancer   . Headache(784.0)   . Back pain   . 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 [place] fever   . Wears dentures     top  . Allergy   . Anemia   . Blood transfusion without reported diagnosis     2x s/p chemotherrapy   . Hx of radiation therapy 03/25/13-05/06/13    left breast 5000 cGy/25 sessions, left breast boost 1000 cGy/5 sessions    ALLERGIES:  is allergic to aspirin and iodinated diagnostic agents.  MEDICATIONS:  Current Outpatient Prescriptions  Medication Sig Dispense Refill  . albuterol (VENTOLIN HFA) 108 (90 BASE) MCG/ACT inhaler Inhale 2 puffs into the lungs every 6 (six) hours as needed.  1 Inhaler  3  . cetirizine (ZYRTEC) 10 MG tablet Take 10 mg by mouth daily as needed.       . fluticasone (FLONASE) 50 MCG/ACT nasal spray Place 2 sprays into the nose daily as needed.      . lidocaine-prilocaine (EMLA) cream Apply topically as needed.  30 g  7  . naproxen sodium (ANAPROX) 220 MG tablet Take 220 mg by mouth as needed.       . tamoxifen (NOLVADEX) 20 MG tablet Take 1 tablet (20 mg total) by mouth daily.  30 tablet  6  . omeprazole (PRILOSEC) 40 MG capsule TAKE  1 CAPSULE BY MOUTH DAILY  30 capsule  2   No current facility-administered medications for this visit.    SURGICAL HISTORY:  Past Surgical History  Procedure Laterality Date  . 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 Cesarean section  1995/1996  . Lobectomy  1997    upper left  . Portacath placement  07/02/2012    Procedure: INSERTION PORT-A-CATH;  Surgeon: Mariella Saa, MD;  Location: [facility];  Service: General;  Laterality: N/A;  right  . Breast surgery  2006    cyst rt br-neg  .  Breast lumpectomy with needle localization and axillary sentinel lymph node bx Left 12/31/2012    Procedure: NEEDLE LOCALIZATION LEFT BREAST LUMPECTOMY AND LEFT AXILLARY SENTENIAL LYMPH NODE BX;  Surgeon: Mariella Saa, MD;  Location: [facility];  Service: General;  Laterality: Left;  . Axillary lymph node dissection Left 02/08/2013    Procedure: LEFT AXILLARY DISSECTION;  Surgeon: Mariella Saa, MD;  Location: [facility];  Service: General;  Laterality: Left;    REVIEW OF SYSTEMS:   A 10 point review of systems was conducted and is otherwise negative except for what is noted above.     PHYSICAL EXAMINATION: Blood pressure 108/55, pulse 73, temperature 98 F (36.7 C), temperature source Oral, resp. rate 20, height 5\' 2"  (1.575 m), weight 120 lb 3.2 oz (54.522 kg). Body mass index is 21.98 kg/(m^2). General: Patient is a well appearing female in no acute distress HEENT: PERRLA, sclerae anicteric no conjunctival pallor, MMM   Neck: supple, no palpable adenopathy Lungs: clear to auscultation, no wheezes, rhonchi or rales Cardiovascular: regular rate rhythm, S1, S2, no murmurs, rubs or gallops Abdomen: Soft, non-tender, non-distended, normoactive bowel sounds, no HSM Extremities: warm and well perfused, no clubbing, cyanosis, or edema Skin: No rashes or lesions Neuro: Non-focal Breasts: deferred   ECOG PERFORMANCE STATUS: 1  LABORATORY DATA: Lab Results  Component Value Date   WBC 5.3 06/26/2013   HGB 12.7 06/26/2013   HCT 36.4 06/26/2013   MCV 96.3 06/26/2013   PLT 233 06/26/2013      Chemistry      Component Value Date/Time   NA 142 06/26/2013 0820   NA 141 01/26/2011 1039   K 4.2 06/26/2013 0820   K 4.5 01/26/2011 1039   CL 100 12/20/2012 1509   CL 106 01/26/2011 1039   CO2 26 06/26/2013 0820   CO2 28 01/26/2011 1039   BUN 10.4 06/26/2013 0820   BUN 7 01/26/2011 1039   CREATININE 0.8 06/26/2013 0820   CREATININE 0.6 01/26/2011 1039       Component Value Date/Time   CALCIUM 9.7 06/26/2013 0820   CALCIUM 9.2 01/26/2011 1039   ALKPHOS 72 06/26/2013 0820   AST 17 06/26/2013 0820   ALT 13 06/26/2013 0820   BILITOT 0.28 06/26/2013 0820       RADIOGRAPHIC STUDIES: Dg Chest 2 View  07/30/2012  *RADIOLOGY REPORT*  Clinical Data: Bronchiectasis.  History of breast cancer.  CHEST - 2 VIEW  Comparison: 07/02/2012 and 05/03/2011  Findings: Power port in place.  There is now an ill-defined 16 mm in density in the left lung base which correlates with the previous cystic lesion seen on CT scan of 01/26/2011.  There is central lucency.  I suspect this represents retraction and wall thickening of the previously seen area of cystic bronchiectasis.  Scarring at the left apex medially  due to cystic bronchiectasis appears slightly less prominent.  The right lung is clear.  Heart size is normal.  Previous left upper lobectomy.  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  No other change since the prior exam.   Original Report Authenticated By: Francene Boyers, M.D.      ASSESSMENT: 47 year old [place] woman  with  #1 a clinical stage IIA (3.1 cm) invasive ductal carcinoma of the left breast status post needle core biopsy 06/05/2012 that showed ER positive PR positive HER-2/neu positive breast cancer with a Ki-67 at 70%.   #2 started chemotherapy on 07/13/2011 consisting of carboplatin, docetaxel and trastuzumab every 3 weeks, with the trastuzumab given weekly  She completed 6 cycles of this therapy.    #3 trastuzumab every 3 weeks to be continued to complete a year;   #4 Lumpectomy and Sentinel node biopsy on 12/31/12, sentinel nodes were positive.  She underwent ALND on 02/08/13 and 0/13 lymph nodes removed were positive.    #5 Patient completed radiation therapy on 05/06/13, and started Tamoxifen therapy on 06/05/13.  PLAN:   1. Patient will proceed with Herceptin today.  I  requested follow up with Dr. Gala Romney today.  Her CBC is stable, I reviewed it with her in detail.    2. She is tolerating her Tamoxifen relatively well.  She will continue taking this daily.    3.  I will see her back in 3 weeks for labs, evaluation, and Herceptin.    All questions were answered. The patient knows to call the clinic with any problems, questions or concerns. We can certainly see the patient much sooner if necessary.   I spent 15 minutes counseling the patient face to face.  The total time spent in the appointment was 30 minutes.  Illa Level, NP Medical Oncology [facility] [phone] 06/27/2013, 8:59 AM

## 2013-06-27 ENCOUNTER — Telehealth: Payer: Self-pay | Admitting: Oncology

## 2013-06-27 NOTE — Telephone Encounter (Signed)
lmonvm for Melissa [name] @ [facility] re appt w/bensimhon in [place]. clinic will call pt. no other orders.

## 2013-07-03 ENCOUNTER — Telehealth: Payer: Self-pay | Admitting: *Deleted

## 2013-07-03 NOTE — Telephone Encounter (Signed)
Patient called requesting a letter that she is taking Tamoxifen. I printed the last office note and mailed to patient.

## 2013-07-12 ENCOUNTER — Ambulatory Visit (INDEPENDENT_AMBULATORY_CARE_PROVIDER_SITE_OTHER): Payer: 59 | Admitting: General Surgery

## 2013-07-12 ENCOUNTER — Encounter (INDEPENDENT_AMBULATORY_CARE_PROVIDER_SITE_OTHER): Payer: Self-pay | Admitting: General Surgery

## 2013-07-12 VITALS — BP 123/88 | HR 66 | Temp 97.7°F | Resp 18 | Ht 62.0 in | Wt 122.2 lb

## 2013-07-12 DIAGNOSIS — C50219 Malignant neoplasm of upper-inner quadrant of unspecified female breast: Secondary | ICD-10-CM

## 2013-07-12 DIAGNOSIS — C50212 Malignant neoplasm of upper-inner quadrant of left female breast: Secondary | ICD-10-CM

## 2013-07-12 NOTE — Progress Notes (Signed)
Chief complaint: Followup cancer left breast  History: Patient returns for more long-term followup for her stage IIA cancer of the left breast.  Treatment as below:  PRIOR THERAPY:  #1 patient was originally seen in the multidisciplinary breast clinic when she found a left breast mass she eventually had MRI and mammograms performed. She was diagnosed with invasive ductal carcinoma that was ER positive PR positive HER-2/neu positive measuring 3.1 cm by MRI criteria. Ki-67 was 70% HER-2 was amplified with a ratio 2.91.  #2 patient was seen at the multidisciplinary breast clinic and a recommendation for neoadjuvant chemotherapy was recommended as patient was interested in breast conservation. She will receive neoadjuvant Taxotere carboplatinum Herceptin. Taxotere carboplatinum will be given every 21 days with weekly Herceptin. Total of 6 cycles of Taxotere and carboplatinum will be administered. This was started 07/12/12.  3 patient had MRI of the breasts performed on 10/15/2012 which does showed reduction in the size of the left breast mass from 3.1to 1.2.   4. Patient begun on every 3 week herceptin beginning 11/29/12.  5. Patient underwent lumpectomy on 12/31/12 a 1.8 cm tumor was removed, the margins were 0.1cm and 1/1 sentinel nodes were positive disease. A re-excision and axillary node dissection was recommended by Dr. Khan. An ALND occurred on 02/08/13 and 0/13 lymph nodes were positive for metastases.  6. Radiation therapy with Dr. Murray from 9/15-10/27/14  7. Tamoxifen started on 06/05/13  She has one further dose of Herceptin planned. She is feeling well. She had significant limited mobility of her left arm at her early postop followup therapy this has entirely resolved. She has some occasional mild brief pain in her breast.  Exam: BP 123/88  Pulse 66  Temp(Src) 97.7 F (36.5 C) (Temporal)  Resp 18  Ht 5' 2" (1.575 m)  Wt 122 lb 3.2 oz (55.43 kg)  BMI 22.35 kg/m2 General: Appears  well HEENT: No palpable masses or thyromegaly. Lungs: Few scattered expiratory wheezes bilaterally Lymph nodes: No cervical, supraclavicular or axillary nodes palpable Breasts: Minimal post radiation changes on the left. Well-healed incisions. No palpable breast masses or even thickening at the lumpectomy site and axilla is negative. Extremities: She has full range of motion of the left shoulder  Assessment and plan: Doing well with no apparent complications from treatment or evidence of early recurrence. Return in 6 months.  

## 2013-07-17 ENCOUNTER — Other Ambulatory Visit (HOSPITAL_BASED_OUTPATIENT_CLINIC_OR_DEPARTMENT_OTHER): Payer: 59

## 2013-07-17 ENCOUNTER — Ambulatory Visit (HOSPITAL_BASED_OUTPATIENT_CLINIC_OR_DEPARTMENT_OTHER): Payer: 59 | Admitting: Adult Health

## 2013-07-17 ENCOUNTER — Telehealth: Payer: Self-pay | Admitting: *Deleted

## 2013-07-17 ENCOUNTER — Encounter: Payer: Self-pay | Admitting: Adult Health

## 2013-07-17 ENCOUNTER — Ambulatory Visit (HOSPITAL_BASED_OUTPATIENT_CLINIC_OR_DEPARTMENT_OTHER): Payer: 59

## 2013-07-17 ENCOUNTER — Ambulatory Visit (HOSPITAL_COMMUNITY)
Admission: RE | Admit: 2013-07-17 | Discharge: 2013-07-17 | Disposition: A | Discharge status: Home / Self Care | Payer: 59 | Source: Ambulatory Visit | Attending: Internal Medicine | Admitting: Internal Medicine

## 2013-07-17 VITALS — BP 110/73 | HR 71 | Temp 98.1°F | Resp 18 | Ht 62.0 in | Wt 122.6 lb

## 2013-07-17 DIAGNOSIS — C50212 Malignant neoplasm of upper-inner quadrant of left female breast: Secondary | ICD-10-CM

## 2013-07-17 DIAGNOSIS — M7989 Other specified soft tissue disorders: Secondary | ICD-10-CM

## 2013-07-17 DIAGNOSIS — Z5112 Encounter for antineoplastic immunotherapy: Secondary | ICD-10-CM

## 2013-07-17 DIAGNOSIS — Z923 Personal history of irradiation: Secondary | ICD-10-CM

## 2013-07-17 DIAGNOSIS — C50919 Malignant neoplasm of unspecified site of unspecified female breast: Secondary | ICD-10-CM

## 2013-07-17 DIAGNOSIS — M79609 Pain in unspecified limb: Secondary | ICD-10-CM | POA: Insufficient documentation

## 2013-07-17 DIAGNOSIS — Z170 Estrogen receptor positive status [ER+]: Secondary | ICD-10-CM

## 2013-07-17 DIAGNOSIS — R600 Localized edema: Secondary | ICD-10-CM

## 2013-07-17 DIAGNOSIS — Z79899 Other long term (current) drug therapy: Secondary | ICD-10-CM | POA: Insufficient documentation

## 2013-07-17 LAB — CBC WITH DIFFERENTIAL/PLATELET
BASO%: 0.6 % (ref 0.0–2.0)
Basophils Absolute: 0 10*3/uL (ref 0.0–0.1)
EOS%: 6.3 % (ref 0.0–7.0)
Eosinophils Absolute: 0.4 10*3/uL (ref 0.0–0.5)
HEMATOCRIT: 37.4 % (ref 34.8–46.6)
HGB: 12.9 g/dL (ref 11.6–15.9)
LYMPH#: 1.7 10*3/uL (ref 0.9–3.3)
LYMPH%: 25.4 % (ref 14.0–49.7)
MCH: 33.6 pg (ref 25.1–34.0)
MCHC: 34.5 g/dL (ref 31.5–36.0)
MCV: 97.4 fL (ref 79.5–101.0)
MONO#: 0.6 10*3/uL (ref 0.1–0.9)
MONO%: 9.2 % (ref 0.0–14.0)
NEUT#: 3.8 10*3/uL (ref 1.5–6.5)
NEUT%: 58.5 % (ref 38.4–76.8)
Platelets: 189 10*3/uL (ref 145–400)
RBC: 3.84 10*6/uL (ref 3.70–5.45)
RDW: 13.4 % (ref 11.2–14.5)
WBC: 6.5 10*3/uL (ref 3.9–10.3)
nRBC: 0 % (ref 0–0)

## 2013-07-17 LAB — COMPREHENSIVE METABOLIC PANEL (CC13)
ALK PHOS: 72 U/L (ref 40–150)
ALT: 11 U/L (ref 0–55)
AST: 15 U/L (ref 5–34)
Albumin: 3.7 g/dL (ref 3.5–5.0)
Anion Gap: 8 mEq/L (ref 3–11)
BUN: 14.8 mg/dL (ref 7.0–26.0)
CO2: 25 mEq/L (ref 22–29)
Calcium: 9.6 mg/dL (ref 8.4–10.4)
Chloride: 108 mEq/L (ref 98–109)
Creatinine: 0.8 mg/dL (ref 0.6–1.1)
Glucose: 85 mg/dl (ref 70–140)
Potassium: 4.5 mEq/L (ref 3.5–5.1)
SODIUM: 140 meq/L (ref 136–145)
Total Bilirubin: 0.27 mg/dL (ref 0.20–1.20)
Total Protein: 7.1 g/dL (ref 6.4–8.3)

## 2013-07-17 MED ORDER — SODIUM CHLORIDE 0.9 % IJ SOLN
10.0000 mL | INTRAMUSCULAR | Status: DC | PRN
  Administered 2013-07-17: 10 mL
  Filled 2013-07-17: qty 10

## 2013-07-17 MED ORDER — ACETAMINOPHEN 325 MG PO TABS
650.0000 mg | ORAL_TABLET | Freq: Once | ORAL | Status: AC
  Administered 2013-07-17: 10:00:00 via ORAL

## 2013-07-17 MED ORDER — DIPHENHYDRAMINE HCL 25 MG PO CAPS
50.0000 mg | ORAL_CAPSULE | Freq: Once | ORAL | Status: AC
  Administered 2013-07-17: 25 mg via ORAL

## 2013-07-17 MED ORDER — SODIUM CHLORIDE 0.9 % IV SOLN
Freq: Once | INTRAVENOUS | Status: AC
  Administered 2013-07-17: 10:00:00 via INTRAVENOUS

## 2013-07-17 MED ORDER — HEPARIN SOD (PORK) LOCK FLUSH 100 UNIT/ML IV SOLN
500.0000 [IU] | Freq: Once | INTRAVENOUS | Status: AC | PRN
  Administered 2013-07-17: 500 [IU]
  Filled 2013-07-17: qty 5

## 2013-07-17 MED ORDER — DIPHENHYDRAMINE HCL 25 MG PO CAPS
ORAL_CAPSULE | ORAL | Status: AC
  Filled 2013-07-17: qty 1

## 2013-07-17 MED ORDER — ACETAMINOPHEN 325 MG PO TABS
ORAL_TABLET | ORAL | Status: AC
  Filled 2013-07-17: qty 2

## 2013-07-17 MED ORDER — TRASTUZUMAB CHEMO INJECTION 440 MG
6.0000 mg/kg | Freq: Once | INTRAVENOUS | Status: AC
  Administered 2013-07-17: 336 mg via INTRAVENOUS
  Filled 2013-07-17: qty 16

## 2013-07-17 NOTE — Progress Notes (Signed)
VASCULAR LAB PRELIMINARY  PRELIMINARY  PRELIMINARY  PRELIMINARY  Left lower extremity venous duplex completed.    Preliminary report:  Left:  No evidence of DVT, superficial thrombosis, or Baker's cyst.  Melodye Swor, RVS 07/17/2013, 12:46 PM

## 2013-07-17 NOTE — Telephone Encounter (Signed)
[place] called reporting patient is negative for DVT and negative for Baker's cyst.  Lyndsey notified.  Verbal order received and read back for patient to restart Tamoxifen and may be discharged home.  [facility] given these orders for patient with this call.

## 2013-07-17 NOTE — Progress Notes (Signed)
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  PRIOR THERAPY:  #1 patient was originally seen in the multidisciplinary breast clinic when she found a left breast mass she eventually had MRI and mammograms performed. She was diagnosed with invasive ductal carcinoma that was ER positive PR positive HER-2/neu positive measuring 3.1 cm by MRI criteria. Ki-67 was 70% HER-2 was amplified with a ratio 2.91.  #2 patient was seen at the multidisciplinary breast clinic and a recommendation for neoadjuvant chemotherapy was recommended as patient was interested in breast conservation. She will receive neoadjuvant Taxotere carboplatinum Herceptin. Taxotere carboplatinum will be given every 21 days with weekly Herceptin. Total of 6 cycles of Taxotere and carboplatinum will be administered.  This was started 07/12/12.  3 patient had MRI of the breasts performed on 10/15/2012 which does show reduction in the size of the left breast mass from 3.1to 1.2. This is a wonderful response.  4. Patient begun on every 3 week herceptin beginning 11/29/12.  5. Patient underwent lumpectomy on 12/31/12 a 1.8 cm tumor was removed, the margins were 0.1cm and 1/1 sentinel nodes were positive disease.  A re-excision and axillary node dissection was recommended by Dr. Humphrey Rolls. An ALND occurred on 02/08/13 and 0/13 lymph nodes were positive for metastases.    6.  Radiation therapy with Dr. Valere Dross from 9/15-10/27/14  7. Tamoxifen started on 06/05/13  CURRENT THERAPY:   Herceptin every 3 weeks, daily Tamoxifen  INTERVAL HISTORY:  Patient returns in followup visit prior to every 3 week Herceptin.  For the past two days she has experienced cramping in her left leg and left leg  swelling.  She stopped taking her Tamoxifen.  She denies chest pain, shortness of breath, palpitations.  Her LMP was in December, 2013, and she received chemotherapy for approximately three months beginning in January 2014.  She denies any fevers, chills, nausea, vomiting, constipation, diarrhea, DOE, PND, or further concerns.     Past Medical History  Diagnosis Date  . Breast cancer   . Headache(784.0)   . Back pain   . Asthma     got a lung infection valley fever in [place] to have lobectomy  . [place] fever   . Wears dentures     top  . Allergy   . Anemia   . Blood transfusion without reported diagnosis     2x s/p chemotherrapy   . Hx of radiation therapy 03/25/13-05/06/13    left breast 5000 cGy/25 sessions, left breast boost 1000 cGy/5 sessions    ALLERGIES:  is allergic to aspirin and iodinated diagnostic agents.  MEDICATIONS:  Current Outpatient Prescriptions  Medication Sig Dispense Refill  . lidocaine-prilocaine (EMLA) cream Apply topically as needed.  30 g  7  . naproxen sodium (ANAPROX) 220 MG tablet Take 220 mg by mouth as needed.       Marland Kitchen omeprazole (PRILOSEC) 40 MG capsule TAKE 1 CAPSULE BY MOUTH DAILY  30 capsule  2  . tamoxifen (NOLVADEX) 20 MG tablet Take 1 tablet (20 mg total) by mouth daily.  30 tablet  6  . albuterol (VENTOLIN HFA) 108 (90 BASE) MCG/ACT inhaler Inhale 2 puffs into the lungs every 6 (six) hours as needed.  1 Inhaler  3  . cetirizine (ZYRTEC) 10  MG tablet Take 10 mg by mouth daily as needed.       . fluticasone (FLONASE) 50 MCG/ACT nasal spray Place 2 sprays into the nose daily as needed.       No current facility-administered medications for this visit.    SURGICAL HISTORY:  Past Surgical History  Procedure Laterality Date  . 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 Cesarean section  1995/1996  . Lobectomy  1997    upper left  . Portacath placement  07/02/2012    Procedure: INSERTION PORT-A-CATH;   Surgeon: Edward Jolly, MD;  Location: [facility];  Service: General;  Laterality: N/A;  right  . Breast surgery  2006    cyst rt br-neg  . Breast lumpectomy with needle localization and axillary sentinel lymph node bx Left 12/31/2012    Procedure: NEEDLE LOCALIZATION LEFT BREAST LUMPECTOMY AND LEFT AXILLARY SENTENIAL LYMPH NODE BX;  Surgeon: Edward Jolly, MD;  Location: [facility];  Service: General;  Laterality: Left;  . Axillary lymph node dissection Left 02/08/2013    Procedure: LEFT AXILLARY DISSECTION;  Surgeon: Edward Jolly, MD;  Location: [facility];  Service: General;  Laterality: Left;    REVIEW OF SYSTEMS:   A 10 point review of systems was conducted and is otherwise negative except for what is noted above.     PHYSICAL EXAMINATION: Blood pressure 110/73, pulse 71, temperature 98.1 F (36.7 C), temperature source Oral, resp. rate 18, height 5' 2" (1.575 m), weight 122 lb 9.6 oz (55.611 kg). Body mass index is 22.42 kg/(m^2). General: Patient is a well appearing female in no acute distress HEENT: PERRLA, sclerae anicteric no conjunctival pallor, MMM   Neck: supple, no palpable adenopathy Lungs: clear to auscultation, no wheezes, rhonchi or rales Cardiovascular: regular rate rhythm, S1, S2, no murmurs, rubs or gallops Abdomen: Soft, non-tender, non-distended, normoactive bowel sounds, no HSM Extremities: warm and well perfused, no clubbing, cyanosis, or edema Skin: No rashes or lesions Neuro: Non-focal Breasts: deferred   ECOG PERFORMANCE STATUS: 1  LABORATORY DATA: Lab Results  Component Value Date   WBC 6.5 07/17/2013   HGB 12.9 07/17/2013   HCT 37.4 07/17/2013   MCV 97.4 07/17/2013   PLT 189 07/17/2013      Chemistry      Component Value Date/Time   NA 140 07/17/2013 0808   NA 141 01/26/2011 1039   K 4.5 07/17/2013 0808   K 4.5 01/26/2011 1039   CL 100 12/20/2012 1509   CL 106 01/26/2011 1039   CO2 25 07/17/2013 0808    CO2 28 01/26/2011 1039   BUN 14.8 07/17/2013 0808   BUN 7 01/26/2011 1039   CREATININE 0.8 07/17/2013 0808   CREATININE 0.6 01/26/2011 1039      Component Value Date/Time   CALCIUM 9.6 07/17/2013 0808   CALCIUM 9.2 01/26/2011 1039   ALKPHOS 72 07/17/2013 0808   AST 15 07/17/2013 0808   ALT 11 07/17/2013 0808   BILITOT 0.27 07/17/2013 0808       RADIOGRAPHIC STUDIES: Dg Chest 2 View  07/30/2012  *RADIOLOGY REPORT*  Clinical Data: Bronchiectasis.  History of breast cancer.  CHEST - 2 VIEW  Comparison: 07/02/2012 and 05/03/2011  Findings: Power port in place.  There is now an ill-defined 16 mm in density in the left lung base which correlates with the previous cystic lesion seen on CT scan of 01/26/2011.  There is  central lucency.  I suspect this represents retraction and wall thickening of the previously seen area of cystic bronchiectasis.  Scarring at the left apex medially due to cystic bronchiectasis appears slightly less prominent.  The right lung is clear.  Heart size is normal.  Previous left upper lobectomy.  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  No other change since the prior exam.   Original Report Authenticated By: Lorriane Shire, M.D.      ASSESSMENT: 48 year old [place] woman  with  #1 a clinical stage IIA (3.1 cm) invasive ductal carcinoma of the left breast status post needle core biopsy 06/05/2012 that showed ER positive PR positive HER-2/neu positive breast cancer with a Ki-67 at 70%.   #2 started chemotherapy on 07/13/2011 consisting of carboplatin, docetaxel and trastuzumab every 3 weeks, with the trastuzumab given weekly  She completed 6 cycles of this therapy.    #3 trastuzumab every 3 weeks to be continued to complete a year;   #4 Lumpectomy and Sentinel node biopsy on 12/31/12, sentinel nodes were positive.  She underwent ALND on 02/08/13 and 0/13 lymph nodes removed were positive.    #5 Patient completed  radiation therapy on 05/06/13, and started Tamoxifen therapy on 06/05/13.  PLAN:   1. Patient will proceed with her final Herceptin today.  Her CBC is stable, I reviewed it with her in detail.  A cmp is pending.    2.  Due to the swelling in her left lower extremity, I ordered an urgent left lower extremity doppler.  If this is positive the plan is to start treatment dose Lovenox and bridge her to Coumadin.    3.  She has stopped the Tamoxifen.  Should the doppler be positive we will start Zoladex therapy.  I have ordered an [place], LH, and estradiol to evaluate her menopausal status.     4.  I went ahead and set up follow up for three months from now.  However, depending on today's results, we may need to see her sooner.    All questions were answered. The patient knows to call the clinic with any problems, questions or concerns. We can certainly see the patient much sooner if necessary.   I spent 25 minutes counseling the patient face to face.  The total time spent in the appointment was 30 minutes.  Minette Headland, [place] [phone] 07/18/2013, 9:23 PM

## 2013-07-17 NOTE — Patient Instructions (Signed)
[facility] Discharge Instructions for Patients Receiving Chemotherapy  Today you received the following chemotherapy agents: Herceptin Phebe Colla!!! The last one!! Congrats!!!)  To help prevent nausea and vomiting after your treatment, we encourage you to take your nausea medication as prescribed.   If you develop nausea and vomiting that is not controlled by your nausea medication, call the clinic.   BELOW ARE SYMPTOMS THAT SHOULD BE REPORTED IMMEDIATELY:  *FEVER GREATER THAN 100.5 F  *CHILLS WITH OR WITHOUT FEVER  NAUSEA AND VOMITING THAT IS NOT CONTROLLED WITH YOUR NAUSEA MEDICATION  *UNUSUAL SHORTNESS OF BREATH  *UNUSUAL BRUISING OR BLEEDING  TENDERNESS IN MOUTH AND THROAT WITH OR WITHOUT PRESENCE OF ULCERS  *URINARY PROBLEMS  *BOWEL PROBLEMS  UNUSUAL RASH Items with * indicate a potential emergency and should be followed up as soon as possible.  Feel free to call the clinic you have any questions or concerns. The clinic phone number is [phone].

## 2013-07-17 NOTE — Telephone Encounter (Signed)
appts made and printed...td 

## 2013-07-18 LAB — LUTEINIZING HORMONE: LH: 29.1 m[IU]/mL

## 2013-07-18 LAB — FOLLICLE STIMULATING HORMONE: FSH: 54.8 m[IU]/mL

## 2013-07-22 ENCOUNTER — Telehealth: Payer: Self-pay | Admitting: *Deleted

## 2013-07-22 NOTE — Telephone Encounter (Signed)
Patient calling in and left voicemail to ask Mendel Ryder and Dr Humphrey Rolls if she can schedule to have PAC removed now. Will discuss with Charlestine Massed, NP, we will need to send order to surgeon for removal.

## 2013-07-23 LAB — ESTRADIOL, ULTRA SENS: ESTRADIOL, ULTRA SENSITIVE: 2 pg/mL

## 2013-07-23 NOTE — Telephone Encounter (Signed)
Yes

## 2013-07-29 NOTE — Telephone Encounter (Signed)
Melissa [name] is scheduled for [facility] Removal on 08/02/13 w/Dr. Excell Seltzer.  [name], CMA for Dr. Excell Seltzer

## 2013-07-30 ENCOUNTER — Encounter (HOSPITAL_BASED_OUTPATIENT_CLINIC_OR_DEPARTMENT_OTHER): Payer: Self-pay | Admitting: *Deleted

## 2013-07-30 DIAGNOSIS — R0989 Other specified symptoms and signs involving the circulatory and respiratory systems: Secondary | ICD-10-CM

## 2013-07-30 HISTORY — DX: Other specified symptoms and signs involving the circulatory and respiratory systems: R09.89

## 2013-07-31 ENCOUNTER — Other Ambulatory Visit (INDEPENDENT_AMBULATORY_CARE_PROVIDER_SITE_OTHER): Payer: Self-pay | Admitting: General Surgery

## 2013-08-02 ENCOUNTER — Encounter (HOSPITAL_BASED_OUTPATIENT_CLINIC_OR_DEPARTMENT_OTHER): Payer: Self-pay

## 2013-08-02 ENCOUNTER — Encounter (HOSPITAL_BASED_OUTPATIENT_CLINIC_OR_DEPARTMENT_OTHER): Payer: 59 | Admitting: Certified Registered"

## 2013-08-02 ENCOUNTER — Ambulatory Visit (HOSPITAL_BASED_OUTPATIENT_CLINIC_OR_DEPARTMENT_OTHER): Payer: 59 | Admitting: Certified Registered"

## 2013-08-02 ENCOUNTER — Encounter (HOSPITAL_BASED_OUTPATIENT_CLINIC_OR_DEPARTMENT_OTHER)
Admission: RE | Disposition: A | Discharge status: Home / Self Care | Payer: Self-pay | Source: Ambulatory Visit | Attending: General Surgery

## 2013-08-02 ENCOUNTER — Ambulatory Visit (HOSPITAL_BASED_OUTPATIENT_CLINIC_OR_DEPARTMENT_OTHER)
Admission: RE | Admit: 2013-08-02 | Discharge: 2013-08-02 | Disposition: A | Discharge status: Home / Self Care | Payer: 59 | Source: Ambulatory Visit | Attending: General Surgery | Admitting: General Surgery

## 2013-08-02 DIAGNOSIS — C50919 Malignant neoplasm of unspecified site of unspecified female breast: Secondary | ICD-10-CM | POA: Insufficient documentation

## 2013-08-02 DIAGNOSIS — Z452 Encounter for adjustment and management of vascular access device: Secondary | ICD-10-CM

## 2013-08-02 DIAGNOSIS — C50219 Malignant neoplasm of upper-inner quadrant of unspecified female breast: Secondary | ICD-10-CM

## 2013-08-02 DIAGNOSIS — Z170 Estrogen receptor positive status [ER+]: Secondary | ICD-10-CM | POA: Insufficient documentation

## 2013-08-02 DIAGNOSIS — F17200 Nicotine dependence, unspecified, uncomplicated: Secondary | ICD-10-CM | POA: Insufficient documentation

## 2013-08-02 HISTORY — PX: PORT-A-CATH REMOVAL: SHX5289

## 2013-08-02 HISTORY — DX: Personal history of antineoplastic chemotherapy: Z92.21

## 2013-08-02 HISTORY — DX: Unspecified osteoarthritis, unspecified site: M19.90

## 2013-08-02 HISTORY — DX: Personal history of malignant neoplasm of breast: Z85.3

## 2013-08-02 HISTORY — DX: Personal history of diseases of the blood and blood-forming organs and certain disorders involving the immune mechanism: Z86.2

## 2013-08-02 HISTORY — DX: Personal history of other diseases of the respiratory system: Z87.09

## 2013-08-02 HISTORY — DX: Personal history of other infectious and parasitic diseases: Z86.19

## 2013-08-02 HISTORY — DX: Other specified symptoms and signs involving the circulatory and respiratory systems: R09.89

## 2013-08-02 LAB — POCT HEMOGLOBIN-HEMACUE: Hemoglobin: 13.5 g/dL (ref 12.0–15.0)

## 2013-08-02 SURGERY — REMOVAL PORT-A-CATH
Anesthesia: Monitor Anesthesia Care | Laterality: Right

## 2013-08-02 MED ORDER — MIDAZOLAM HCL 2 MG/2ML IJ SOLN
INTRAMUSCULAR | Status: AC
  Filled 2013-08-02: qty 2

## 2013-08-02 MED ORDER — HYDROCODONE-ACETAMINOPHEN 5-325 MG PO TABS: 1.0000 | ORAL_TABLET | Freq: Four times a day (QID) | ORAL | Status: DC | PRN

## 2013-08-02 MED ORDER — PROPOFOL INFUSION 10 MG/ML OPTIME
INTRAVENOUS | Status: DC | PRN
  Administered 2013-08-02: 75 ug/kg/min via INTRAVENOUS

## 2013-08-02 MED ORDER — LACTATED RINGERS IV SOLN
INTRAVENOUS | Status: DC
  Administered 2013-08-02: 11:00:00 via INTRAVENOUS

## 2013-08-02 MED ORDER — FENTANYL CITRATE 0.05 MG/ML IJ SOLN
INTRAMUSCULAR | Status: DC | PRN
  Administered 2013-08-02: 50 ug via INTRAVENOUS

## 2013-08-02 MED ORDER — LIDOCAINE HCL (CARDIAC) 20 MG/ML IV SOLN
INTRAVENOUS | Status: DC | PRN
  Administered 2013-08-02: 60 mg via INTRAVENOUS

## 2013-08-02 MED ORDER — MIDAZOLAM HCL 2 MG/ML PO SYRP: 12.0000 mg | ORAL_SOLUTION | Freq: Once | ORAL | Status: DC | PRN

## 2013-08-02 MED ORDER — FENTANYL CITRATE 0.05 MG/ML IJ SOLN: 50.0000 ug | INTRAMUSCULAR | Status: DC | PRN

## 2013-08-02 MED ORDER — LIDOCAINE HCL (PF) 1 % IJ SOLN
INTRAMUSCULAR | Status: DC | PRN
  Administered 2013-08-02: 4 mL

## 2013-08-02 MED ORDER — ONDANSETRON HCL 4 MG/2ML IJ SOLN
INTRAMUSCULAR | Status: DC | PRN
  Administered 2013-08-02: 4 mg via INTRAVENOUS

## 2013-08-02 MED ORDER — CEFAZOLIN SODIUM-DEXTROSE 2-3 GM-% IV SOLR
INTRAVENOUS | Status: AC
  Filled 2013-08-02: qty 50

## 2013-08-02 MED ORDER — BUPIVACAINE-EPINEPHRINE 0.25% -1:200000 IJ SOLN
INTRAMUSCULAR | Status: DC | PRN
  Administered 2013-08-02: 4 mL

## 2013-08-02 MED ORDER — MIDAZOLAM HCL 2 MG/2ML IJ SOLN: 1.0000 mg | INTRAMUSCULAR | Status: DC | PRN

## 2013-08-02 MED ORDER — FENTANYL CITRATE 0.05 MG/ML IJ SOLN
INTRAMUSCULAR | Status: AC
  Filled 2013-08-02: qty 2

## 2013-08-02 SURGICAL SUPPLY — 39 items
BAG DECANTER FOR FLEXI CONT (MISCELLANEOUS) IMPLANT
BANDAGE ADH SHEER 1  50/CT (GAUZE/BANDAGES/DRESSINGS) IMPLANT
BLADE SURG 15 STRL LF DISP TIS (BLADE) ×1 IMPLANT
BLADE SURG 15 STRL SS (BLADE) ×2
CHLORAPREP W/TINT 26ML (MISCELLANEOUS) ×3 IMPLANT
COVER MAYO STAND STRL (DRAPES) ×3 IMPLANT
COVER TABLE BACK 60X90 (DRAPES) ×3 IMPLANT
DECANTER SPIKE VIAL GLASS SM (MISCELLANEOUS) IMPLANT
DERMABOND ADVANCED (GAUZE/BANDAGES/DRESSINGS) ×2
DERMABOND ADVANCED .7 DNX12 (GAUZE/BANDAGES/DRESSINGS) ×1 IMPLANT
DRAPE C-ARM 42X72 X-RAY (DRAPES) IMPLANT
DRAPE LAPAROTOMY T 102X78X121 (DRAPES) ×3 IMPLANT
DRAPE UTILITY XL STRL (DRAPES) ×3 IMPLANT
ELECT REM PT RETURN 9FT ADLT (ELECTROSURGICAL) ×3
ELECTRODE REM PT RTRN 9FT ADLT (ELECTROSURGICAL) ×1 IMPLANT
GLOVE BIOGEL PI IND STRL 7.5 (GLOVE) ×2 IMPLANT
GLOVE BIOGEL PI IND STRL 8 (GLOVE) ×1 IMPLANT
GLOVE BIOGEL PI INDICATOR 7.5 (GLOVE) ×4
GLOVE BIOGEL PI INDICATOR 8 (GLOVE) ×2
GLOVE ECLIPSE 6.5 STRL STRAW (GLOVE) ×6 IMPLANT
GLOVE ECLIPSE 7.0 STRL STRAW (GLOVE) ×6 IMPLANT
GLOVE SS BIOGEL STRL SZ 7.5 (GLOVE) ×1 IMPLANT
GLOVE SUPERSENSE BIOGEL SZ 7.5 (GLOVE) ×2
GOWN STRL REUS W/ TWL LRG LVL3 (GOWN DISPOSABLE) ×3 IMPLANT
GOWN STRL REUS W/ TWL XL LVL3 (GOWN DISPOSABLE) IMPLANT
GOWN STRL REUS W/TWL LRG LVL3 (GOWN DISPOSABLE) ×6
GOWN STRL REUS W/TWL XL LVL3 (GOWN DISPOSABLE)
NEEDLE HYPO 22GX1.5 SAFETY (NEEDLE) IMPLANT
NEEDLE HYPO 25X1 1.5 SAFETY (NEEDLE) ×3 IMPLANT
PACK BASIN DAY SURGERY FS (CUSTOM PROCEDURE TRAY) ×3 IMPLANT
PENCIL BUTTON HOLSTER BLD 10FT (ELECTRODE) ×3 IMPLANT
SLEEVE SCD COMPRESS KNEE MED (MISCELLANEOUS) ×3 IMPLANT
SUT MON AB 4-0 PC3 18 (SUTURE) ×3 IMPLANT
SUT PROLENE 2 0 CT2 30 (SUTURE) IMPLANT
SUT SILK 4 0 TIES 17X18 (SUTURE) IMPLANT
SYR 5ML LUER SLIP (SYRINGE) IMPLANT
SYR CONTROL 10ML LL (SYRINGE) ×3 IMPLANT
TOWEL OR 17X24 6PK STRL BLUE (TOWEL DISPOSABLE) ×3 IMPLANT
TOWEL OR NON WOVEN STRL DISP B (DISPOSABLE) IMPLANT

## 2013-08-02 NOTE — Anesthesia Procedure Notes (Signed)
Procedure Name: MAC Date/Time: 08/02/2013 11:16 AM Performed by: Merlene Dante Pre-anesthesia Checklist: Patient identified, Emergency Drugs available, Suction available, Patient being monitored and Timeout performed Patient Re-evaluated:Patient Re-evaluated prior to inductionOxygen Delivery Method: Simple face mask

## 2013-08-02 NOTE — Op Note (Signed)
Pre op diagnosis: status post Port-A-Cath  Postop diagnosis: Same  Surgical procedure: Removal of Port-A-Cath  Surgeon: Excell Seltzer  Anesthesia: Local with IV sedation  Description of procedure: The patient is positioned supine and the anterior chest sterilely prepped and draped.Patient and procedure were verified. Local anesthesia was used to infiltrate the skin and underlying soft tissue. The previous incision was opened and sharp dissection carried down onto the port and catheter. The catheter was withdrawn intact. The port was sharply dissected out of the subcutaneous tissue and removed with all suture material. The subcutaneous tissue was closed with interrupted 4-0 Monocryl and the skin was closed with subcuticular 4-0 Monocryl and Dermabond. Patient tolerated the procedure well.   Edward Jolly MD, FACS  08/02/2013

## 2013-08-02 NOTE — H&P (View-Only) (Signed)
Chief complaint: Followup cancer left breast  History: Patient returns for more long-term followup for her stage IIA cancer of the left breast.  Treatment as below:  PRIOR THERAPY:  #1 patient was originally seen in the multidisciplinary breast clinic when she found a left breast mass she eventually had MRI and mammograms performed. She was diagnosed with invasive ductal carcinoma that was ER positive PR positive HER-2/neu positive measuring 3.1 cm by MRI criteria. Ki-67 was 70% HER-2 was amplified with a ratio 2.91.  #2 patient was seen at the multidisciplinary breast clinic and a recommendation for neoadjuvant chemotherapy was recommended as patient was interested in breast conservation. She will receive neoadjuvant Taxotere carboplatinum Herceptin. Taxotere carboplatinum will be given every 21 days with weekly Herceptin. Total of 6 cycles of Taxotere and carboplatinum will be administered. This was started 07/12/12.  3 patient had MRI of the breasts performed on 10/15/2012 which does showed reduction in the size of the left breast mass from 3.1to 1.2.   4. Patient begun on every 3 week herceptin beginning 11/29/12.  5. Patient underwent lumpectomy on 12/31/12 a 1.8 cm tumor was removed, the margins were 0.1cm and 1/1 sentinel nodes were positive disease. A re-excision and axillary node dissection was recommended by Dr. Humphrey Rolls. An ALND occurred on 02/08/13 and 0/13 lymph nodes were positive for metastases.  6. Radiation therapy with Dr. Valere Dross from 9/15-10/27/14  7. Tamoxifen started on 06/05/13  She has one further dose of Herceptin planned. She is feeling well. She had significant limited mobility of her left arm at her early postop followup therapy this has entirely resolved. She has some occasional mild brief pain in her breast.  Exam: BP 123/88  Pulse 66  Temp(Src) 97.7 F (36.5 C) (Temporal)  Resp 18  Ht 5' 2" (1.575 m)  Wt 122 lb 3.2 oz (55.43 kg)  BMI 22.35 kg/m2 General: Appears  well HEENT: No palpable masses or thyromegaly. Lungs: Few scattered expiratory wheezes bilaterally Lymph nodes: No cervical, supraclavicular or axillary nodes palpable Breasts: Minimal post radiation changes on the left. Well-healed incisions. No palpable breast masses or even thickening at the lumpectomy site and axilla is negative. Extremities: She has full range of motion of the left shoulder  Assessment and plan: Doing well with no apparent complications from treatment or evidence of early recurrence. Return in 6 months.

## 2013-08-02 NOTE — Anesthesia Preprocedure Evaluation (Signed)
Anesthesia Evaluation  Patient identified by MRN, date of birth, ID band Patient awake    Reviewed: Allergy & Precautions, H&P , NPO status , Patient's Chart, lab work & pertinent test results  Airway Mallampati: I TM Distance: >3 FB Neck ROM: Full    Dental no notable dental hx. (+) Teeth Intact and Dental Advisory Given   Pulmonary Current Smoker,  breath sounds clear to auscultation  Pulmonary exam normal       Cardiovascular negative cardio ROS  Rhythm:Regular Rate:Normal     Neuro/Psych  Headaches, negative psych ROS   GI/Hepatic negative GI ROS, Neg liver ROS,   Endo/Other  negative endocrine ROS  Renal/GU negative Renal ROS  negative genitourinary   Musculoskeletal   Abdominal   Peds  Hematology negative hematology ROS (+)   Anesthesia Other Findings   Reproductive/Obstetrics negative OB ROS                           Anesthesia Physical Anesthesia Plan  ASA: II  Anesthesia Plan: MAC   Post-op Pain Management:    Induction: Intravenous  Airway Management Planned: Simple Face Mask  Additional Equipment:   Intra-op Plan:   Post-operative Plan:   Informed Consent: I have reviewed the patients History and Physical, chart, labs and discussed the procedure including the risks, benefits and alternatives for the proposed anesthesia with the patient or authorized representative who has indicated his/her understanding and acceptance.   Dental advisory given  Plan Discussed with: CRNA  Anesthesia Plan Comments:         Anesthesia Quick Evaluation

## 2013-08-02 NOTE — Discharge Instructions (Signed)
Call as needed for redness, swelling, drainage from the incision or any other concerns. Otherwise return for routine followup in approximately 6 months.   Post Anesthesia Home Care Instructions  Activity: Get plenty of rest for the remainder of the day. A responsible adult should stay with you for 24 hours following the procedure.  For the next 24 hours, DO NOT: -Drive a car -Paediatric nurse -Drink alcoholic beverages -Take any medication unless instructed by your physician -Make any legal decisions or sign important papers.  Meals: Start with liquid foods such as gelatin or soup. Progress to regular foods as tolerated. Avoid greasy, spicy, heavy foods. If nausea and/or vomiting occur, drink only clear liquids until the nausea and/or vomiting subsides. Call your physician if vomiting continues.  Special Instructions/Symptoms: Your throat may feel dry or sore from the anesthesia or the breathing tube placed in your throat during surgery. If this causes discomfort, gargle with warm salt water. The discomfort should disappear within 24 hours.

## 2013-08-02 NOTE — Interval H&P Note (Signed)
History and Physical Interval Note:  08/02/2013 10:50 AM  Melissa [name]  has presented today for surgery, with the diagnosis of un-needed port a cath  The various methods of treatment have been discussed with the patient and family. After consideration of risks, benefits and other options for treatment, the patient has consented to  Procedure(s): REMOVAL PORT-A-CATH (N/A) as a surgical intervention .  The patient's history has been reviewed, patient examined, no change in status, stable for surgery.  I have reviewed the patient's chart and labs.  Questions were answered to the patient's satisfaction.     Verdean Murin T

## 2013-08-02 NOTE — Anesthesia Postprocedure Evaluation (Signed)
  Anesthesia Post-op Note  Patient: Melissa [name]  Procedure(s) Performed: Procedure(s): REMOVAL PORT-A-CATH (Right)  Patient Location: PACU  Anesthesia Type:MAC  Level of Consciousness: awake, alert , oriented and patient cooperative  Airway and Oxygen Therapy: Patient Spontanous Breathing  Post-op Pain: none  Post-op Assessment: Post-op Vital signs reviewed, Patient's Cardiovascular Status Stable, Respiratory Function Stable, Patent Airway, No signs of Nausea or vomiting and Pain level controlled  Post-op Vital Signs: Reviewed and stable  Complications: No apparent anesthesia complications

## 2013-08-02 NOTE — Transfer of Care (Signed)
Immediate Anesthesia Transfer of Care Note  Patient: Melissa [name]  Procedure(s) Performed: Procedure(s): REMOVAL PORT-A-CATH (Right)  Patient Location: PACU  Anesthesia Type:MAC  Level of Consciousness: awake, alert , oriented and patient cooperative  Airway & Oxygen Therapy: Patient Spontanous Breathing and Patient connected to face mask oxygen  Post-op Assessment: Report given to PACU RN and Post -op Vital signs reviewed and stable  Post vital signs: Reviewed and stable  Complications: No apparent anesthesia complications

## 2013-08-05 ENCOUNTER — Encounter (HOSPITAL_BASED_OUTPATIENT_CLINIC_OR_DEPARTMENT_OTHER): Payer: Self-pay | Admitting: General Surgery

## 2013-08-07 ENCOUNTER — Ambulatory Visit (HOSPITAL_COMMUNITY)
Admission: RE | Admit: 2013-08-07 | Discharge: 2013-08-07 | Disposition: A | Discharge status: Home / Self Care | Payer: 59 | Source: Ambulatory Visit | Attending: Radiation Oncology | Admitting: Radiation Oncology

## 2013-08-07 ENCOUNTER — Other Ambulatory Visit: Payer: Self-pay

## 2013-08-07 ENCOUNTER — Encounter (HOSPITAL_COMMUNITY): Payer: Self-pay

## 2013-08-07 ENCOUNTER — Ambulatory Visit (HOSPITAL_BASED_OUTPATIENT_CLINIC_OR_DEPARTMENT_OTHER)
Admission: RE | Admit: 2013-08-07 | Discharge: 2013-08-07 | Disposition: A | Discharge status: Home / Self Care | Payer: 59 | Source: Ambulatory Visit | Attending: Radiation Oncology | Admitting: Radiation Oncology

## 2013-08-07 VITALS — BP 106/72 | HR 74 | Wt 124.1 lb

## 2013-08-07 DIAGNOSIS — F17200 Nicotine dependence, unspecified, uncomplicated: Secondary | ICD-10-CM | POA: Insufficient documentation

## 2013-08-07 DIAGNOSIS — C50219 Malignant neoplasm of upper-inner quadrant of unspecified female breast: Secondary | ICD-10-CM

## 2013-08-07 DIAGNOSIS — R9389 Abnormal findings on diagnostic imaging of other specified body structures: Secondary | ICD-10-CM

## 2013-08-07 DIAGNOSIS — Z8249 Family history of ischemic heart disease and other diseases of the circulatory system: Secondary | ICD-10-CM | POA: Insufficient documentation

## 2013-08-07 DIAGNOSIS — J45909 Unspecified asthma, uncomplicated: Secondary | ICD-10-CM | POA: Insufficient documentation

## 2013-08-07 DIAGNOSIS — C50919 Malignant neoplasm of unspecified site of unspecified female breast: Secondary | ICD-10-CM | POA: Insufficient documentation

## 2013-08-07 DIAGNOSIS — R0789 Other chest pain: Secondary | ICD-10-CM

## 2013-08-07 DIAGNOSIS — C50212 Malignant neoplasm of upper-inner quadrant of left female breast: Secondary | ICD-10-CM

## 2013-08-07 DIAGNOSIS — R931 Abnormal findings on diagnostic imaging of heart and coronary circulation: Secondary | ICD-10-CM

## 2013-08-07 DIAGNOSIS — I519 Heart disease, unspecified: Secondary | ICD-10-CM

## 2013-08-07 NOTE — Progress Notes (Signed)
  Echocardiogram 2D Echocardiogram has been performed.  Diamond Nickel 08/07/2013, 9:28 AM

## 2013-08-07 NOTE — Addendum Note (Signed)
Encounter addended by: Scarlette Calico, RN on: 08/07/2013 10:21 AM<BR>     Documentation filed: Orders

## 2013-08-07 NOTE — Addendum Note (Signed)
Encounter addended by: Scarlette Calico, RN on: 08/07/2013 10:25 AM<BR>     Documentation filed: Patient Instructions Section

## 2013-08-07 NOTE — Patient Instructions (Signed)
Your physician has requested that you have en exercise stress myoview. For further information please visit [email or web address]. Please follow instruction sheet, as given.  Your physician recommends that you schedule a follow-up appointment in: 1 month

## 2013-08-07 NOTE — Progress Notes (Signed)
Patient ID: Melissa [name], female   DOB: January 23, 1966, 47 y.o.   MRN: [id number]  Referring Physician: Dr. Humphrey Rolls Primary Care:  Primary Cardiologist: Oncologist: Dr Humphrey Rolls Pulmonologist: Dr. Melvyn Novas  General Surgeon: Dr. Excell Seltzer  HPI: Melissa [name] is a 48 y.o. 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  S/P 12/31/12 L lumpectomy. Current smoker. 02/2013 L axillary node dissection  She received neoadjuvant Taxotere carboplatinum Herceptin. Taxotere carboplatinum will be given every 21 days with weekly Herceptin. Total of 6 cycles of Taxotere and carboplatinum will be administered. (07/12/12) Plan to continue Herceptin for 1 year. Will finish in January 2015.   Echo:   06/25/12:  LVEF 55-60%, lat s' 11.3 09/20/12 EF 60-65% , lat s' 11.4 11/19/12 EF  60%, Lateral S' 10.5 03/18/13 EF  60 %, Lateral s' 10.5, Global strain -17.7  08/07/13: EF 50-55% subtle anteroseptal HK lat s' 10.4 GLS -13.2%  Follow up: Since last visit has completed Herceptin and had Melissa [name] portacath removed. Denies SOB, orthopnea, or PND. Mild chest pressure that lasts 5-6 min that she has noticed with just sitting. Can occur with exertion but not reproducible. Occasional pain down L arm. No jaw pain. Still smoking 10 cigarettes a day. Weight at home 115-120 lbs.  On Jan 7th did have some LE edema and LE u/s was normal.   FH: Strong family history of CAD.  Review of Systems: All pertinent positives and negatives as in HPI, otherwise negative.    Past Medical History  Diagnosis Date  . Wears dentures     upper  . Hx of radiation therapy 03/25/13-05/06/13    left breast 5000 cGy/25 sessions, left breast boost 1000 cGy/5 sessions  . Headache(784.0)     due to eye strain or not eating  . History of anemia     no current problem  . Arthritis     knees and hips  . H/O  coccidioidomycosis     was reason for lung lobectomy  . History of asthma     as a child  . History of breast cancer 2014    left  . Runny nose 07/30/2013    clear drainage  . History of chemotherapy     finished 07/17/2013    Current Outpatient Prescriptions  Medication Sig Dispense Refill  . albuterol (VENTOLIN HFA) 108 (90 BASE) MCG/ACT inhaler Inhale 2 puffs into the lungs every 6 (six) hours as needed.  1 Inhaler  3  . amoxicillin (AMOXIL) 500 MG tablet Take 500 mg by mouth 2 (two) times daily.      . fluticasone (FLONASE) 50 MCG/ACT nasal spray Place 2 sprays into the nose daily as needed.      Marland Kitchen HYDROcodone-acetaminophen (NORCO) 5-325 MG per tablet Take 1-2 tablets by mouth every 6 (six) hours as needed.  20 tablet  0  . tamoxifen (NOLVADEX) 20 MG tablet Take 1 tablet (20 mg total) by mouth daily.  30 tablet  6   No current facility-administered medications for this encounter.    Allergies  Allergen Reactions  . Aspirin Anaphylaxis    THROAT CLOSES  . Iodinated Diagnostic Agents Rash    History   Social History  . Marital Status: Married    Spouse Name: N/A    Number of Children: 2  . Years of Education: N/A  Occupational History  . Property Manager    Social History Main Topics  . Smoking status: Current Every Day Smoker -- 0.50 packs/day for 24 years    Types: Cigarettes  . Smokeless tobacco: Never Used  . Alcohol Use: No  . Drug Use: No  . Sexual Activity: Yes     Comment: trying to quit   Other Topics Concern  . Not on file   Social History Narrative  . No narrative on file   Filed Vitals:   08/07/13 0919  BP: 106/72  Pulse: 74  Weight: 124 lb 1.9 oz (56.3 kg)  SpO2: 100%    PHYSICAL EXAM: General:  Well appearing. No respiratory difficulty HEENT: normal Neck: supple. no JVD. Carotids 2+ bilat; no bruits. No lymphadenopathy or thryomegaly appreciated. Cor: PMI nondisplaced. Regular rate & rhythm. No rubs, gallops or murmurs. Lungs: coarse  breath sounds Abdomen: soft, nontender, nondistended. No hepatosplenomegaly. No bruits or masses. Good bowel sounds. Extremities: no cyanosis, clubbing, rash, LLE trace - 1+ at ankle. Mild varicosities  RLE no edema Neuro: alert & oriented x 3, cranial nerves grossly intact. moves all 4 extremities w/o difficulty. Affect pleasant.  ECG: NSR 71 RSR' No ST-T wave abnormalities.    ASSESSMENT & PLAN:  1. L Breast Cancer Upper-Inner Quadrant  - Has completed Herceptin treatment 07/2013.  - Dr Haroldine Laws reviewed and discussed ECHO. EF and lateral s' stable. Has completed therapy!! 2. Current tobacco - Continues to smoke about 10 cigarettes a day which is down from last time. Congratulated Melissa [name] and encouraged Melissa [name] to continue to try and quit.  3. Chest Pressure - She is having chest pain that last 5-6 minutes with no activity and associated with L arm pain. Not sure if related to Melissa [name] radiation. With strong family history obtained EKG which was had no abnormalities. With strong family history of CAD and risk factors will order treadmill stress test (GXT)   Rande Brunt NP-C 9:40 AM   Patient seen and examined with Junie Bame, NP. We discussed all aspects of the encounter. I agree with the assessment and plan as stated above. Echo reviewed. Overall EF may be slightly lower but not concerningly so. However, there does appear to be a focal wall motion abnormality in the basilar to mid anteroseptal. She dose now have chest pressure which has mostly atypical features. Given CP, FHX CAD and tobacco use, I feel she needs further risk stratification. We discussed stress testing vs cath. She would prefer to start with stress testing. ECG ok. If stress test + would have low threshold to proceed with cath. RTC in 1 month.   Daniel Bensimhon,MD 10:11 AM

## 2013-08-15 ENCOUNTER — Ambulatory Visit (HOSPITAL_COMMUNITY): Payer: 59 | Attending: Cardiology | Admitting: Radiology

## 2013-08-15 VITALS — BP 106/68 | HR 73 | Ht 62.0 in | Wt 121.0 lb

## 2013-08-15 DIAGNOSIS — R079 Chest pain, unspecified: Secondary | ICD-10-CM

## 2013-08-15 DIAGNOSIS — Z9221 Personal history of antineoplastic chemotherapy: Secondary | ICD-10-CM | POA: Insufficient documentation

## 2013-08-15 DIAGNOSIS — R002 Palpitations: Secondary | ICD-10-CM | POA: Insufficient documentation

## 2013-08-15 DIAGNOSIS — Z853 Personal history of malignant neoplasm of breast: Secondary | ICD-10-CM | POA: Insufficient documentation

## 2013-08-15 DIAGNOSIS — Z8249 Family history of ischemic heart disease and other diseases of the circulatory system: Secondary | ICD-10-CM | POA: Insufficient documentation

## 2013-08-15 DIAGNOSIS — C50219 Malignant neoplasm of upper-inner quadrant of unspecified female breast: Secondary | ICD-10-CM

## 2013-08-15 DIAGNOSIS — R0789 Other chest pain: Secondary | ICD-10-CM | POA: Insufficient documentation

## 2013-08-15 DIAGNOSIS — J45909 Unspecified asthma, uncomplicated: Secondary | ICD-10-CM | POA: Insufficient documentation

## 2013-08-15 DIAGNOSIS — R9431 Abnormal electrocardiogram [ECG] [EKG]: Secondary | ICD-10-CM | POA: Insufficient documentation

## 2013-08-15 DIAGNOSIS — R42 Dizziness and giddiness: Secondary | ICD-10-CM | POA: Insufficient documentation

## 2013-08-15 DIAGNOSIS — F17200 Nicotine dependence, unspecified, uncomplicated: Secondary | ICD-10-CM | POA: Insufficient documentation

## 2013-08-15 MED ORDER — TECHNETIUM TC 99M SESTAMIBI GENERIC - CARDIOLITE
10.8000 | Freq: Once | INTRAVENOUS | Status: AC | PRN
  Administered 2013-08-15: 11 via INTRAVENOUS

## 2013-08-15 MED ORDER — TECHNETIUM TC 99M SESTAMIBI GENERIC - CARDIOLITE
33.0000 | Freq: Once | INTRAVENOUS | Status: AC | PRN
  Administered 2013-08-15: 33 via INTRAVENOUS

## 2013-08-15 NOTE — Progress Notes (Signed)
[facility] [address] [place], [place] [address] [phone]    Cardiology Nuclear Med Study  Melissa [name] is a 48 y.o. female     MRN : [id number]     DOB: [birth year]  Procedure Date: 08/15/2013  Nuclear Med Background Indication for Stress Test:  Evaluation for Ischemia and Abnormal EKG History:  No known CAD, Echo 2015 (focal wall abnormality) EF 50-55%, Asthma, Hx chemo/lobectomy, Hx breast cancer  Cardiac Risk Factors: Premature Family History - CAD and Smoker  Symptoms:  Chest Pressure with Exertion (last date of chest discomfort was yesterday), Light-Headedness and Palpitations   Nuclear Pre-Procedure Caffeine/Decaff Intake:  None > 12 hrs NPO After: 8:30pm   Lungs:  clear O2 Sat: 96% on room air. IV 0.9% NS with Angio Cath:  22g  IV Site: R Hand x 1, tolerated well IV Started by:  Irven Baltimore, RN  Chest Size (in):  32 Cup Size: A  Height: 5\' 2"  (1.575 m)  Weight:  121 lb (54.885 kg)  BMI:  Body mass index is 22.13 kg/(m^2). Tech Comments:  N/A    Nuclear Med Study 1 or 2 day study: 1 day  Stress Test Type:  Stress  Reading MD: N/A  Order Authorizing Provider:  Glori Bickers, MD  Resting Radionuclide: Technetium 55m Sestamibi  Resting Radionuclide Dose: 11.0 mCi   Stress Radionuclide:  Technetium 73m Sestamibi  Stress Radionuclide Dose: 33.0 mCi           Stress Protocol Rest HR: 73 Stress HR: 157  Rest BP: 106/68 Stress BP: 144/74  Exercise Time (min): 6:00 METS: 7.0           Dose of Adenosine (mg):  n/a Dose of Lexiscan: n/a mg  Dose of Atropine (mg): n/a Dose of Dobutamine: n/a mcg/kg/min (at max HR)  Stress Test Technologist: Glade Lloyd, BS-ES  Nuclear Technologist:  Annye Rusk, CNMT     Rest Procedure:  Myocardial perfusion imaging was performed at rest 45 minutes following the intravenous administration of Technetium 32m Sestamibi. Rest ECG: NSR with poor R wave progression.   Stress Procedure:  The  patient exercised on the treadmill utilizing the Bruce Protocol for 6:00 minutes. The patient stopped due to SOB, fatigue and denied any chest pain.  Technetium 8m Sestamibi was injected at peak exercise and myocardial perfusion imaging was performed after a brief delay. Stress ECG: No significant change from baseline ECG  QPS Raw Data Images:  Normal; no motion artifact; normal heart/lung ratio. Bowel loop attenuation at rest.  Stress Images:  Normal homogeneous uptake in all areas of the myocardium. Rest Images:  Normal homogeneous uptake in all areas of the myocardium. Subtraction (SDS):  No evidence of ischemia. Transient Ischemic Dilatation (Normal <1.22):  1.01 Lung/Heart Ratio (Normal <0.45):  .42  Quantitative Gated Spect Images QGS EDV:  54 ml QGS ESV:  21 ml  Impression Exercise Capacity:  Fair exercise capacity. BP Response:  Normal blood pressure response. Clinical Symptoms:  There is dyspnea. ECG Impression:  No significant ST segment change suggestive of ischemia. Comparison with Prior Nuclear Study: No previous nuclear study performed  Overall Impression:  Low risk stress nuclear study with no ischemia identified. .  LV Ejection Fraction: 61%.  LV Wall Motion:  NL LV Function; NL Wall Motion   Candee Furbish, MD

## 2013-08-16 ENCOUNTER — Telehealth (HOSPITAL_COMMUNITY): Payer: Self-pay | Admitting: Cardiology

## 2013-08-16 NOTE — Telephone Encounter (Signed)
Pt called to request stress test results

## 2013-08-19 NOTE — Telephone Encounter (Signed)
Dr Haroldine Laws called pt with results on 2/6 pm

## 2013-09-04 ENCOUNTER — Encounter (HOSPITAL_COMMUNITY): Payer: 59

## 2013-10-08 ENCOUNTER — Telehealth: Payer: Self-pay | Admitting: Adult Health

## 2013-10-08 NOTE — Telephone Encounter (Signed)
, °

## 2013-10-08 NOTE — Progress Notes (Signed)
Hematology and Oncology Follow Up Visit  Melissa [name] [id number] 1965/10/05 48 y.o. 10/10/2013 10:39 PM     Principle Diagnosis:Melissa [name] 48 y.o. 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     Prior Therapy:  #1 patient was originally seen in the multidisciplinary breast clinic when she found a left breast mass she eventually had MRI and mammograms performed. She was diagnosed with invasive ductal carcinoma that was ER positive PR positive HER-2/neu positive measuring 3.1 cm by MRI criteria. Ki-67 was 70% HER-2 was amplified with a ratio 2.91.   #2 patient was seen at the multidisciplinary breast clinic and a recommendation for neoadjuvant chemotherapy was recommended as patient was interested in breast conservation. She will receive neoadjuvant Taxotere carboplatinum Herceptin. Taxotere carboplatinum will be given every 21 days with weekly Herceptin. Total of 6 cycles of Taxotere and carboplatinum will be administered. This was started 07/12/12.   3 Patient had MRI of the breasts performed on 10/15/2012 which does show reduction in the size of the left breast mass from 3.1 to 1.2.   4. Patient is s/p on every 3 week herceptin beginning 11/29/12.  She completed Herceptin therapy on 07/17/13.  5. Patient underwent lumpectomy on 12/31/12 a 1.8 cm tumor was removed, the margins were 0.1cm and 1/1 sentinel nodes were positive disease. A re-excision and axillary node dissection was recommended by Dr. Humphrey Rolls. An ALND occurred on 02/08/13 and 0/13 lymph nodes were positive for metastases.   6. Radiation therapy with Dr. Valere Dross from 9/15-10/27/14   7. Tamoxifen started on 06/05/13   Current therapy:  Tamoxifen, 76m daily  Interim History: Melissa [name] 48y.o. 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  She is here today for routine f/u.  She noticed a lump 2-3 weeks ago on her left lateral breast towards the  axilla it is tender and became so 4 days go.  She denies fevers, redness, rashes, skin changes to the breast, or nipple discharge.  She is taking Tamoxifen daily and is tolerating it well.  She has intermittent hot flashes, and is suffering from vaginal dryness and atrophy, experiencing one episode of painful intercourse. She denies joint aches and pains.  We reviewed her health maintenance below.  Otherwise, a 10 point ROS is negative.    Medications:  Current Outpatient Prescriptions  Medication Sig Dispense Refill  . tamoxifen (NOLVADEX) 20 MG tablet Take 1 tablet (20 mg total) by mouth daily.  30 tablet  6  . albuterol (VENTOLIN HFA) 108 (90 BASE) MCG/ACT inhaler Inhale 2 puffs into the lungs every 6 (six) hours as needed.  1 Inhaler  3  . amoxicillin (AMOXIL) 500 MG tablet Take 500 mg by mouth 2 (two) times daily.      . fluticasone (FLONASE) 50 MCG/ACT nasal spray Place 2 sprays into the nose daily as needed.      .Marland KitchenHYDROcodone-acetaminophen (NORCO) 5-325 MG per tablet Take 1-2 tablets by mouth every 6 (six) hours as needed.  20 tablet  0   No current facility-administered medications for this visit.     Allergies:  Allergies  Allergen Reactions  . Aspirin Anaphylaxis    THROAT CLOSES  . Iodinated Diagnostic Agents Rash    Medical History: Past Medical History  Diagnosis Date  . Wears dentures     upper  . Hx of radiation therapy 03/25/13-05/06/13    left breast 5000 cGy/25 sessions,  left breast boost 1000 cGy/5 sessions  . Headache(784.0)     due to eye strain or not eating  . History of anemia     no current problem  . Arthritis     knees and hips  . H/O coccidioidomycosis     was reason for lung lobectomy  . History of asthma     as a child  . History of breast cancer 2014    left  . Runny nose 07/30/2013    clear drainage  . History of chemotherapy     finished 07/17/2013    Surgical History:  Past Surgical History  Procedure Laterality Date  . Lung lobectomy  Left 05/1996    upper lobe - due to [facility] Fever  . Cesarean section  1995/1996  . Portacath placement  07/02/2012    Procedure: INSERTION PORT-A-CATH;  Surgeon: Edward Jolly, MD;  Location: [facility];  Service: General;  Laterality: N/A;  right  . Breast lumpectomy with needle localization and axillary sentinel lymph node bx Left 12/31/2012    Procedure: NEEDLE LOCALIZATION LEFT BREAST LUMPECTOMY AND LEFT AXILLARY SENTENIAL LYMPH NODE BX;  Surgeon: Edward Jolly, MD;  Location: [place];  Service: General;  Laterality: Left;  . Axillary lymph node dissection Left 02/08/2013    Procedure: LEFT AXILLARY DISSECTION;  Surgeon: Edward Jolly, MD;  Location: [place];  Service: General;  Laterality: Left;  . Breast cyst excision Right 2006  . Port-a-cath removal Right 08/02/2013    Procedure: REMOVAL PORT-A-CATH;  Surgeon: Edward Jolly, MD;  Location: [place];  Service: General;  Laterality: Right;     Review of Systems: A 10 point review of systems was conducted and is otherwise negative except for what is noted above.    Health Maintenance  Mammogram:  03/08/13 Colonoscopy: n/a Bone Density Scan: n/a Pap Smear: due Eye Exam: 2 years ago Lipid Panel: never    Physical Exam: Blood pressure 92/58, pulse 80, temperature 98.4 F (36.9 C), temperature source Oral, resp. rate 18, height 5' 2" (1.575 m), weight 124 lb 4.8 oz (56.382 kg). GENERAL: Patient is a well appearing female in no acute distress HEENT:  Sclerae anicteric.  Oropharynx clear and moist. No ulcerations or evidence of oropharyngeal candidiasis. Neck is supple.  NODES:  No cervical, supraclavicular, or axillary lymphadenopathy palpated.  BREAST EXAM:  2 cm breast mass in the left lateral breast, no skin changes, nipple changes/discharge.  Right breast no masses, skin changes, nipple changes/discharge LUNGS:  Clear to auscultation  bilaterally.  No wheezes or rhonchi. HEART:  Regular rate and rhythm. No murmur appreciated. ABDOMEN:  Soft, nontender.  Positive, normoactive bowel sounds. No organomegaly palpated. MSK:  No focal spinal tenderness to palpation. Full range of motion bilaterally in the upper extremities. EXTREMITIES:  No peripheral edema.   SKIN:  Clear with no obvious rashes or skin changes. No nail dyscrasia. NEURO:  Nonfocal. Well oriented.  Appropriate affect. ECOG PERFORMANCE STATUS: 1 - Symptomatic but completely ambulatory   Lab Results: Lab Results  Component Value Date   WBC 5.9 10/09/2013   HGB 14.0 10/09/2013   HCT 41.9 10/09/2013   MCV 97.8 10/09/2013   PLT 229 10/09/2013     Chemistry      Component Value Date/Time   NA 142 10/09/2013 1051   NA 141 01/26/2011 1039   K 4.3 10/09/2013 1051   K 4.5 01/26/2011 1039   CL 100  12/20/2012 1509   CL 106 01/26/2011 1039   CO2 26 10/09/2013 1051   CO2 28 01/26/2011 1039   BUN 10.5 10/09/2013 1051   BUN 7 01/26/2011 1039   CREATININE 0.8 10/09/2013 1051   CREATININE 0.6 01/26/2011 1039      Component Value Date/Time   CALCIUM 9.8 10/09/2013 1051   CALCIUM 9.2 01/26/2011 1039   ALKPHOS 77 10/09/2013 1051   AST 16 10/09/2013 1051   ALT 13 10/09/2013 1051   BILITOT 0.33 10/09/2013 1051     Assessment and Plan: Melissa [name] 48 y.o. female with  1. Stage II triple positive invasive ductal carcinoma.  She is s/p neoadjuvant chemotherapy with Herceptin, lumpectomy and ALND, radiation, adjuvant Herceptin, and is currently taking Tamoxifen daily.  She will continue this.  She did not have lab studies today and I have added them on to the end of her appointment.  2.  Left breast mass.  I ordered a diagnostic mammogram and ultrasound to be done to the left breast for evaluation as quickly as possible.    3.  Vaginal dryness and atrophy.  We discussed vaginal dryness in detail today.  I suggested lubricants and slow vaginal dilation to help improve the atrophy to prevent painful  intercourse.  I also gave the patient the sexuality with breast cancer booklet that has suggestions for vaginal dryness and atrophy as well.    4.  Health maintenance.  I recommended she schedule a pap smear, eye exam, and cholesterol check.  We discussed survivorship in detail including diet, exercise, smoking cessation, and self breast exams.    The patient will return in 6 months for labs and follow up.  She knows to contact us in the interim for any questions or concerns and we can certainly see her sooner if needed.  I spent 25 minutes counseling the patient face to face.  The total time spent in the appointment was 30 minutes.  Minette Headland, [place] [phone] 10/10/2013 10:39 PM

## 2013-10-09 ENCOUNTER — Ambulatory Visit (HOSPITAL_BASED_OUTPATIENT_CLINIC_OR_DEPARTMENT_OTHER): Payer: 59

## 2013-10-09 ENCOUNTER — Encounter: Payer: Self-pay | Admitting: Adult Health

## 2013-10-09 ENCOUNTER — Telehealth: Payer: Self-pay | Admitting: Adult Health

## 2013-10-09 ENCOUNTER — Other Ambulatory Visit: Payer: 59

## 2013-10-09 ENCOUNTER — Ambulatory Visit (HOSPITAL_BASED_OUTPATIENT_CLINIC_OR_DEPARTMENT_OTHER): Payer: 59 | Admitting: Adult Health

## 2013-10-09 VITALS — BP 92/58 | HR 80 | Temp 98.4°F | Resp 18 | Ht 62.0 in | Wt 124.3 lb

## 2013-10-09 DIAGNOSIS — C50919 Malignant neoplasm of unspecified site of unspecified female breast: Secondary | ICD-10-CM

## 2013-10-09 DIAGNOSIS — Z170 Estrogen receptor positive status [ER+]: Secondary | ICD-10-CM

## 2013-10-09 DIAGNOSIS — N952 Postmenopausal atrophic vaginitis: Secondary | ICD-10-CM

## 2013-10-09 DIAGNOSIS — N899 Noninflammatory disorder of vagina, unspecified: Secondary | ICD-10-CM

## 2013-10-09 DIAGNOSIS — N63 Unspecified lump in unspecified breast: Secondary | ICD-10-CM

## 2013-10-09 DIAGNOSIS — C50219 Malignant neoplasm of upper-inner quadrant of unspecified female breast: Secondary | ICD-10-CM

## 2013-10-09 LAB — CBC WITH DIFFERENTIAL/PLATELET
BASO%: 1 % (ref 0.0–2.0)
Basophils Absolute: 0.1 10*3/uL (ref 0.0–0.1)
EOS%: 5.4 % (ref 0.0–7.0)
Eosinophils Absolute: 0.3 10*3/uL (ref 0.0–0.5)
HEMATOCRIT: 41.9 % (ref 34.8–46.6)
HGB: 14 g/dL (ref 11.6–15.9)
LYMPH%: 34 % (ref 14.0–49.7)
MCH: 32.6 pg (ref 25.1–34.0)
MCHC: 33.4 g/dL (ref 31.5–36.0)
MCV: 97.8 fL (ref 79.5–101.0)
MONO#: 0.4 10*3/uL (ref 0.1–0.9)
MONO%: 7.5 % (ref 0.0–14.0)
NEUT#: 3.1 10*3/uL (ref 1.5–6.5)
NEUT%: 52.1 % (ref 38.4–76.8)
Platelets: 229 10*3/uL (ref 145–400)
RBC: 4.28 10*6/uL (ref 3.70–5.45)
RDW: 13 % (ref 11.2–14.5)
WBC: 5.9 10*3/uL (ref 3.9–10.3)
lymph#: 2 10*3/uL (ref 0.9–3.3)

## 2013-10-09 LAB — COMPREHENSIVE METABOLIC PANEL (CC13)
ALT: 13 U/L (ref 0–55)
ANION GAP: 10 meq/L (ref 3–11)
AST: 16 U/L (ref 5–34)
Albumin: 3.8 g/dL (ref 3.5–5.0)
Alkaline Phosphatase: 77 U/L (ref 40–150)
BUN: 10.5 mg/dL (ref 7.0–26.0)
CO2: 26 meq/L (ref 22–29)
Calcium: 9.8 mg/dL (ref 8.4–10.4)
Chloride: 107 mEq/L (ref 98–109)
Creatinine: 0.8 mg/dL (ref 0.6–1.1)
Glucose: 82 mg/dl (ref 70–140)
Potassium: 4.3 mEq/L (ref 3.5–5.1)
Sodium: 142 mEq/L (ref 136–145)
Total Bilirubin: 0.33 mg/dL (ref 0.20–1.20)
Total Protein: 7.5 g/dL (ref 6.4–8.3)

## 2013-10-09 NOTE — Patient Instructions (Addendum)
Continue daily Tamoxifen.  We will set up a mammogram and ultrasound of the left breast.  You should schedule a pap smear, eye exam, and a cholesterol check.  We will see you back in 3 months.  Please call us if you have any questions or concerns.

## 2013-10-09 NOTE — Telephone Encounter (Signed)
, °

## 2013-10-15 ENCOUNTER — Ambulatory Visit: Payer: 59 | Admitting: Adult Health

## 2013-10-15 ENCOUNTER — Other Ambulatory Visit: Payer: 59

## 2013-10-17 ENCOUNTER — Ambulatory Visit
Admission: RE | Admit: 2013-10-17 | Discharge: 2013-10-17 | Disposition: A | Discharge status: Home / Self Care | Payer: Self-pay | Source: Ambulatory Visit | Attending: Adult Health | Admitting: Adult Health

## 2013-10-17 DIAGNOSIS — N63 Unspecified lump in unspecified breast: Secondary | ICD-10-CM

## 2013-11-20 ENCOUNTER — Other Ambulatory Visit: Payer: Self-pay | Admitting: Internal Medicine

## 2013-11-20 ENCOUNTER — Other Ambulatory Visit (HOSPITAL_COMMUNITY)
Admission: RE | Admit: 2013-11-20 | Discharge: 2013-11-20 | Disposition: A | Discharge status: Home / Self Care | Payer: 59 | Source: Ambulatory Visit | Attending: Internal Medicine | Admitting: Internal Medicine

## 2013-11-20 DIAGNOSIS — Z01419 Encounter for gynecological examination (general) (routine) without abnormal findings: Secondary | ICD-10-CM | POA: Insufficient documentation

## 2013-11-20 DIAGNOSIS — Z1151 Encounter for screening for human papillomavirus (HPV): Secondary | ICD-10-CM | POA: Insufficient documentation

## 2013-12-31 ENCOUNTER — Telehealth: Payer: Self-pay | Admitting: Adult Health

## 2013-12-31 NOTE — Telephone Encounter (Signed)
CALLED PT TO ADVISED APT 7/1 HAS BEEN MOVED PER LC REQ. GAVE PT APPT FOR 02/13/14 @ 8.15AM AND MAILED APPT CALENDAR.

## 2014-01-05 ENCOUNTER — Other Ambulatory Visit: Payer: Self-pay | Admitting: Adult Health

## 2014-01-08 ENCOUNTER — Ambulatory Visit: Payer: 59 | Admitting: Adult Health

## 2014-01-08 ENCOUNTER — Other Ambulatory Visit: Payer: 59

## 2014-01-16 ENCOUNTER — Other Ambulatory Visit: Payer: Self-pay | Admitting: Adult Health

## 2014-01-16 DIAGNOSIS — Z853 Personal history of malignant neoplasm of breast: Secondary | ICD-10-CM

## 2014-01-16 DIAGNOSIS — Z9889 Other specified postprocedural states: Secondary | ICD-10-CM

## 2014-01-24 ENCOUNTER — Ambulatory Visit
Admission: RE | Admit: 2014-01-24 | Discharge: 2014-01-24 | Disposition: A | Discharge status: Home / Self Care | Payer: 59 | Source: Ambulatory Visit | Attending: Adult Health | Admitting: Adult Health

## 2014-01-24 DIAGNOSIS — Z853 Personal history of malignant neoplasm of breast: Secondary | ICD-10-CM

## 2014-01-24 DIAGNOSIS — Z9889 Other specified postprocedural states: Secondary | ICD-10-CM

## 2014-02-07 ENCOUNTER — Encounter (INDEPENDENT_AMBULATORY_CARE_PROVIDER_SITE_OTHER): Payer: Self-pay | Admitting: General Surgery

## 2014-02-07 ENCOUNTER — Ambulatory Visit (INDEPENDENT_AMBULATORY_CARE_PROVIDER_SITE_OTHER): Payer: 59 | Admitting: General Surgery

## 2014-02-07 VITALS — BP 102/68 | HR 71 | Temp 97.9°F | Resp 16 | Ht 62.0 in | Wt 119.4 lb

## 2014-02-07 DIAGNOSIS — C50219 Malignant neoplasm of upper-inner quadrant of unspecified female breast: Secondary | ICD-10-CM

## 2014-02-07 DIAGNOSIS — C50212 Malignant neoplasm of upper-inner quadrant of left female breast: Secondary | ICD-10-CM

## 2014-02-07 NOTE — Progress Notes (Signed)
Chief complaint: Followup cancer left breast  History: Patient returns for more long-term followup for her stage IIA cancer of the left breast. She was diagnosed with invasive ductal carcinoma that was ER positive PR positive HER-2/neu positive measuring 3.1 cm by MRI criteria. Ki-67 was 70% HER-2 was amplified with a ratio 2.91. Patient was seen at the multidisciplinary breast clinic and a recommendation for neoadjuvant chemotherapy was recommended as patient was interested in breast conservation. She  received neoadjuvant Taxotere carboplatinum Herceptin. She had reduction in the size of the left breast mass from 3.1to 1.2.   Patient underwent lumpectomy on 12/31/12 a 1.8 cm tumor was removed, the margins were 0.1cm and 1/1 sentinel nodes were positive disease. A re-excision and axillary node dissection was recommended by Dr. Humphrey Rolls. An ALND occurred on 02/08/13 and 0/13 lymph nodes were positive for metastases. She completed her Ceftin therapy and now presents for long-term followup with surgery date of June 2014.  She has felt 2 Lumps, one in her axilla and one just lateral to her lumpectomy site for a number of months.  Mammogram was negative in March and again this month. Ultrasound in April directed at the palpable abnormalities showed 2 small areas of fat necrosis or wall cyst. She does feel like the areas are somewhat larger than they were at that time and occasionally tender  Exam: BP 102/68  Pulse 71  Temp(Src) 97.9 F (36.6 C) (Temporal)  Resp 16  Ht _0  (1.575 m)  Wt 119 lb 6.4 oz (54.159 kg)  BMI 21.83 kg/m2 General: Appears well Skin: Paresh infection Lymph nodes: No cervical, subclavicular or axillary nodes palpable Lungs: Occasional mild wheeze Breasts: Well-healed lumpectomy and axillary incisions on the left. The tail of Spence just above the axillary incision is about a 1 cm discrete palpable mass. Just lateral to the lumpectomy incision in the left breast is about 1.5 cm discrete  palpable mass.  Assessment and plan: Doing well following the above treatment. She has 2 small palpable masses pelvic ultrasound have confirmed as fat necrosis. She however feels these are slightly larger. I think would be reasonable to repeat an ultrasound to see if there is been any change. Otherwise continue observation and she will return in 6 months.

## 2014-02-10 ENCOUNTER — Other Ambulatory Visit (INDEPENDENT_AMBULATORY_CARE_PROVIDER_SITE_OTHER): Payer: Self-pay | Admitting: General Surgery

## 2014-02-10 ENCOUNTER — Other Ambulatory Visit: Payer: Self-pay | Admitting: *Deleted

## 2014-02-10 DIAGNOSIS — N6320 Unspecified lump in the left breast, unspecified quadrant: Secondary | ICD-10-CM

## 2014-02-10 DIAGNOSIS — C50219 Malignant neoplasm of upper-inner quadrant of unspecified female breast: Secondary | ICD-10-CM

## 2014-02-10 MED ORDER — TAMOXIFEN CITRATE 20 MG PO TABS: ORAL_TABLET | ORAL | Status: DC

## 2014-02-10 NOTE — Telephone Encounter (Signed)
Message to call patient concerning refill request received.  Called and she requested tamoxifen.  Order will be sent to [facility].  Confirmed location as [employer] [facility] [employer] at [employer].

## 2014-02-11 ENCOUNTER — Ambulatory Visit
Admission: RE | Admit: 2014-02-11 | Discharge: 2014-02-11 | Disposition: A | Discharge status: Home / Self Care | Payer: 59 | Source: Ambulatory Visit | Attending: General Surgery | Admitting: General Surgery

## 2014-02-11 DIAGNOSIS — N6320 Unspecified lump in the left breast, unspecified quadrant: Secondary | ICD-10-CM

## 2014-02-11 DIAGNOSIS — C50212 Malignant neoplasm of upper-inner quadrant of left female breast: Secondary | ICD-10-CM

## 2014-02-12 ENCOUNTER — Other Ambulatory Visit: Payer: Self-pay | Admitting: *Deleted

## 2014-02-12 DIAGNOSIS — C50219 Malignant neoplasm of upper-inner quadrant of unspecified female breast: Secondary | ICD-10-CM

## 2014-02-13 ENCOUNTER — Encounter: Payer: Self-pay | Admitting: Adult Health

## 2014-02-13 ENCOUNTER — Other Ambulatory Visit (HOSPITAL_BASED_OUTPATIENT_CLINIC_OR_DEPARTMENT_OTHER): Payer: 59

## 2014-02-13 ENCOUNTER — Ambulatory Visit (HOSPITAL_BASED_OUTPATIENT_CLINIC_OR_DEPARTMENT_OTHER): Payer: 59 | Admitting: Adult Health

## 2014-02-13 VITALS — BP 98/68 | HR 70 | Temp 98.2°F | Resp 18 | Ht 62.0 in | Wt 120.1 lb

## 2014-02-13 DIAGNOSIS — Z170 Estrogen receptor positive status [ER+]: Secondary | ICD-10-CM

## 2014-02-13 DIAGNOSIS — C50219 Malignant neoplasm of upper-inner quadrant of unspecified female breast: Secondary | ICD-10-CM

## 2014-02-13 DIAGNOSIS — C50919 Malignant neoplasm of unspecified site of unspecified female breast: Secondary | ICD-10-CM

## 2014-02-13 DIAGNOSIS — F17200 Nicotine dependence, unspecified, uncomplicated: Secondary | ICD-10-CM

## 2014-02-13 LAB — CBC WITH DIFFERENTIAL/PLATELET
BASO%: 1.3 % (ref 0.0–2.0)
BASOS ABS: 0.1 10*3/uL (ref 0.0–0.1)
EOS ABS: 0.3 10*3/uL (ref 0.0–0.5)
EOS%: 5.6 % (ref 0.0–7.0)
HCT: 41.3 % (ref 34.8–46.6)
HGB: 14.1 g/dL (ref 11.6–15.9)
LYMPH%: 28.1 % (ref 14.0–49.7)
MCH: 33.7 pg (ref 25.1–34.0)
MCHC: 34.3 g/dL (ref 31.5–36.0)
MCV: 98.4 fL (ref 79.5–101.0)
MONO#: 0.6 10*3/uL (ref 0.1–0.9)
MONO%: 9.2 % (ref 0.0–14.0)
NEUT#: 3.4 10*3/uL (ref 1.5–6.5)
NEUT%: 55.8 % (ref 38.4–76.8)
Platelets: 219 10*3/uL (ref 145–400)
RBC: 4.19 10*6/uL (ref 3.70–5.45)
RDW: 13.6 % (ref 11.2–14.5)
WBC: 6.1 10*3/uL (ref 3.9–10.3)
lymph#: 1.7 10*3/uL (ref 0.9–3.3)

## 2014-02-13 LAB — COMPREHENSIVE METABOLIC PANEL (CC13)
ALT: 12 U/L (ref 0–55)
ANION GAP: 8 meq/L (ref 3–11)
AST: 16 U/L (ref 5–34)
Albumin: 3.7 g/dL (ref 3.5–5.0)
Alkaline Phosphatase: 72 U/L (ref 40–150)
BILIRUBIN TOTAL: 0.36 mg/dL (ref 0.20–1.20)
BUN: 9.8 mg/dL (ref 7.0–26.0)
CO2: 28 meq/L (ref 22–29)
Calcium: 9.6 mg/dL (ref 8.4–10.4)
Chloride: 106 mEq/L (ref 98–109)
Creatinine: 0.9 mg/dL (ref 0.6–1.1)
GLUCOSE: 65 mg/dL — AB (ref 70–140)
Potassium: 4.1 mEq/L (ref 3.5–5.1)
SODIUM: 142 meq/L (ref 136–145)
TOTAL PROTEIN: 7.2 g/dL (ref 6.4–8.3)

## 2014-02-13 NOTE — Patient Instructions (Signed)
You are doing well.  You have no sign of recurrence.  Continue taking Tamoxifen daily.  If the areas in your breasts enlarge in any way, please contact me.  I recommend healthy diet, exercise, continued breast exams and smoking cessation.    Smoking Cessation Quitting smoking is important to your health and has many advantages. However, it is not always easy to quit since nicotine is a very addictive drug. Oftentimes, people try 3 times or more before being able to quit. This document explains the best ways for you to prepare to quit smoking. Quitting takes hard work and a lot of effort, but you can do it. ADVANTAGES OF QUITTING SMOKING  You will live longer, feel better, and live better.  Your body will feel the impact of quitting smoking almost immediately.  Within 20 minutes, blood pressure decreases. Your pulse returns to its normal level.  After 8 hours, carbon monoxide levels in the blood return to normal. Your oxygen level increases.  After 24 hours, the chance of having a heart attack starts to decrease. Your breath, hair, and body stop smelling like smoke.  After 48 hours, damaged nerve endings begin to recover. Your sense of taste and smell improve.  After 72 hours, the body is virtually free of nicotine. Your bronchial tubes relax and breathing becomes easier.  After 2 to 12 weeks, lungs can hold more air. Exercise becomes easier and circulation improves.  The risk of having a heart attack, stroke, cancer, or lung disease is greatly reduced.  After 1 year, the risk of coronary heart disease is cut in half.  After 5 years, the risk of stroke falls to the same as a nonsmoker.  After 10 years, the risk of lung cancer is cut in half and the risk of other cancers decreases significantly.  After 15 years, the risk of coronary heart disease drops, usually to the level of a nonsmoker.  If you are pregnant, quitting smoking will improve your chances of having a healthy baby.  The  people you live with, especially any children, will be healthier.  You will have extra money to spend on things other than cigarettes. QUESTIONS TO THINK ABOUT BEFORE ATTEMPTING TO QUIT You may want to talk about your answers with your health care provider.  Why do you want to quit?  If you tried to quit in the past, what helped and what did not?  What will be the most difficult situations for you after you quit? How will you plan to handle them?  Who can help you through the tough times? Your family? Friends? A health care provider?  What pleasures do you get from smoking? What ways can you still get pleasure if you quit? Here are some questions to ask your health care provider:  How can you help me to be successful at quitting?  What medicine do you think would be best for me and how should I take it?  What should I do if I need more help?  What is smoking withdrawal like? How can I get information on withdrawal? GET READY  Set a quit date.  Change your environment by getting rid of all cigarettes, ashtrays, matches, and lighters in your home, car, or work. Do not let people smoke in your home.  Review your past attempts to quit. Think about what worked and what did not. GET SUPPORT AND ENCOURAGEMENT You have a better chance of being successful if you have help. You can get support in  many ways.  Tell your family, friends, and coworkers that you are going to quit and need their support. Ask them not to smoke around you.  Get individual, group, or telephone counseling and support. Programs are available at [employer] and health centers. Call your local health department for information about programs in your area.  Spiritual beliefs and practices may help some smokers quit.  Download a "quit meter" on your computer to keep track of quit statistics, such as how long you have gone without smoking, cigarettes not smoked, and money saved.  Get a self-help book about  quitting smoking and staying off tobacco. [place] yourself from urges to smoke. Talk to someone, go for a walk, or occupy your time with a task.  Change your normal routine. Take a different route to work. Drink tea instead of coffee. Eat breakfast in a different place.  Reduce your stress. Take a hot bath, exercise, or read a book.  Plan something enjoyable to do every day. Reward yourself for not smoking.  Explore interactive web-based programs that specialize in helping you quit. GET MEDICINE AND USE IT CORRECTLY Medicines can help you stop smoking and decrease the urge to smoke. Combining medicine with the above behavioral methods and support can greatly increase your chances of successfully quitting smoking.  Nicotine replacement therapy helps deliver nicotine to your body without the negative effects and risks of smoking. Nicotine replacement therapy includes nicotine gum, lozenges, inhalers, nasal sprays, and skin patches. Some may be available over-the-counter and others require a prescription.  Antidepressant medicine helps people abstain from smoking, but how this works is unknown. This medicine is available by prescription.  Nicotinic receptor partial agonist medicine simulates the effect of nicotine in your brain. This medicine is available by prescription. Ask your health care provider for advice about which medicines to use and how to use them based on your health history. Your health care provider will tell you what side effects to look out for if you choose to be on a medicine or therapy. Carefully read the information on the package. Do not use any other product containing nicotine while using a nicotine replacement product.  RELAPSE OR DIFFICULT SITUATIONS Most relapses occur within the first 3 months after quitting. Do not be discouraged if you start smoking again. Remember, most people try several times before finally quitting. You may have  symptoms of withdrawal because your body is used to nicotine. You may crave cigarettes, be irritable, feel very hungry, cough often, get headaches, or have difficulty concentrating. The withdrawal symptoms are only temporary. They are strongest when you first quit, but they will go away within 10-14 days. To reduce the chances of relapse, try to:  Avoid drinking alcohol. Drinking lowers your chances of successfully quitting.  Reduce the amount of caffeine you consume. Once you quit smoking, the amount of caffeine in your body increases and can give you symptoms, such as a rapid heartbeat, sweating, and anxiety.  Avoid smokers because they can make you want to smoke.  Do not let weight gain distract you. Many smokers will gain weight when they quit, usually less than 10 pounds. Eat a healthy diet and stay active. You can always lose the weight gained after you quit.  Find ways to improve your mood other than smoking. FOR MORE INFORMATION  www.smokefree.gov  Document Released: 06/21/2001 Document Revised: 11/11/2013 Document Reviewed: 10/06/2011 [facility] Patient Information 2015 [place], [place]. This information is not intended to  replace advice given to you by your health care provider. Make sure you discuss any questions you have with your health care provider.  

## 2014-02-13 NOTE — Progress Notes (Signed)
Hematology and Oncology Follow Up Visit  Melissa [name] [id number] 07-11-1966 48 y.o. 02/13/2014 9:08 AM     Principle Diagnosis:Melissa [name] 48 y.o. 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     Prior Therapy:  #1 patient was originally seen in the multidisciplinary breast clinic when she found a left breast mass she eventually had MRI and mammograms performed. She was diagnosed with invasive ductal carcinoma that was ER positive PR positive HER-2/neu positive measuring 3.1 cm by MRI criteria. Ki-67 was 70% HER-2 was amplified with a ratio 2.91.   #2 patient was seen at the multidisciplinary breast clinic and a recommendation for neoadjuvant chemotherapy was recommended as patient was interested in breast conservation. She will receive neoadjuvant Taxotere carboplatinum Herceptin. Taxotere carboplatinum will be given every 21 days with weekly Herceptin. Total of 6 cycles of Taxotere and carboplatinum will be administered. This was started 07/12/12.   3 Patient had MRI of the breasts performed on 10/15/2012 which does show reduction in the size of the left breast mass from 3.1 to 1.2.   4. Patient is s/p on every 3 week herceptin beginning 11/29/12.  She completed Herceptin therapy on 07/17/13.  5. Patient underwent lumpectomy on 12/31/12 a 1.8 cm tumor was removed, the margins were 0.1cm and 1/1 sentinel nodes were positive disease. A re-excision and axillary node dissection was recommended by Dr. Humphrey Rolls. An ALND occurred on 02/08/13 and 0/13 lymph nodes were positive for metastases.   6. Radiation therapy with Dr. Valere Dross from 9/15-10/27/14   7. Tamoxifen started on 06/05/13   Current therapy:  Tamoxifen, $RemoveBef'20mg'[id number]$  daily  Interim History: Melissa [name] 48 y.o. 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  She is taking Tamoxifen daily and is tolerating it very well.  She was experiencing vaginal dryness at her last  appointment along with leg cramps and that has since improved.  She did recently undergo a repeat mammogram and ultrasound at the breast center that was ordered by Dr. Excell Seltzer and the areas of concern in the left breast were determined to be cysts.  She denies any new pain, headaches, or any further concerns.  We reviewed her health maintenance below.     Medications:  Current Outpatient Prescriptions  Medication Sig Dispense Refill  . albuterol (VENTOLIN HFA) 108 (90 BASE) MCG/ACT inhaler Inhale 2 puffs into the lungs every 6 (six) hours as needed.  1 Inhaler  3  . amoxicillin (AMOXIL) 500 MG tablet Take 500 mg by mouth 2 (two) times daily.      . fluticasone (FLONASE) 50 MCG/ACT nasal spray Place 2 sprays into the nose daily as needed.      . tamoxifen (NOLVADEX) 20 MG tablet TAKE 1 TABLET BY MOUTH EVERY DAY  30 tablet  3  . HYDROcodone-acetaminophen (NORCO) 5-325 MG per tablet Take 1-2 tablets by mouth every 6 (six) hours as needed.  20 tablet  0   No current facility-administered medications for this visit.     Allergies:  Allergies  Allergen Reactions  . Aspirin Anaphylaxis    THROAT CLOSES  . Iodinated Diagnostic Agents Rash    Medical History: Past Medical History  Diagnosis Date  . Wears dentures     upper  . Hx of radiation therapy 03/25/13-05/06/13    left breast 5000 cGy/25 sessions, left breast boost 1000 cGy/5 sessions  . Headache(784.0)     due to eye strain  or not eating  . History of anemia     no current problem  . Arthritis     knees and hips  . H/O coccidioidomycosis     was reason for lung lobectomy  . History of asthma     as a child  . History of breast cancer 2014    left  . Runny nose 07/30/2013    clear drainage  . History of chemotherapy     finished 07/17/2013    Surgical History:  Past Surgical History  Procedure Laterality Date  . Lung lobectomy Left 05/1996    upper lobe - due to [facility] Fever  . Cesarean section  1995/1996  . Portacath  placement  07/02/2012    Procedure: INSERTION PORT-A-CATH;  Surgeon: Mariella Saa, MD;  Location: [facility];  Service: General;  Laterality: N/A;  right  . Breast lumpectomy with needle localization and axillary sentinel lymph node bx Left 12/31/2012    Procedure: NEEDLE LOCALIZATION LEFT BREAST LUMPECTOMY AND LEFT AXILLARY SENTENIAL LYMPH NODE BX;  Surgeon: Mariella Saa, MD;  Location: [facility];  Service: General;  Laterality: Left;  . Axillary lymph node dissection Left 02/08/2013    Procedure: LEFT AXILLARY DISSECTION;  Surgeon: Mariella Saa, MD;  Location: [facility];  Service: General;  Laterality: Left;  . Breast cyst excision Right 2006  . Port-a-cath removal Right 08/02/2013    Procedure: REMOVAL PORT-A-CATH;  Surgeon: Mariella Saa, MD;  Location: [facility];  Service: General;  Laterality: Right;     Review of Systems: A 10 point review of systems was conducted and is otherwise negative except for what is noted above.    Health Maintenance  Mammogram: 01/2015 Colonoscopy: n/a Bone Density Scan: n/a Pap Smear: 10/2013 Eye Exam: 2 years ago Lipid Panel: never    Physical Exam: Blood pressure 98/68, pulse 70, temperature 98.2 F (36.8 C), temperature source Oral, resp. rate 18, height 5\' 2"  (1.575 m), weight 120 lb 1.6 oz (54.477 kg). GENERAL: Patient is a well appearing female in no acute distress HEENT:  Sclerae anicteric.  Oropharynx clear and moist. No ulcerations or evidence of oropharyngeal candidiasis. Neck is supple.  NODES:  No cervical, supraclavicular, or axillary lymphadenopathy palpated.  BREAST EXAM:  Left breast with thickening in breast and left axillary thickness, right breast no masses or nodules LUNGS:  Clear to auscultation bilaterally.  No wheezes or rhonchi. HEART:  Regular rate and rhythm. No murmur appreciated. ABDOMEN:  Soft, nontender.  Positive, normoactive bowel  sounds. No organomegaly palpated. MSK:  No focal spinal tenderness to palpation. Full range of motion bilaterally in the upper extremities. EXTREMITIES:  No peripheral edema.   SKIN:  Clear with no obvious rashes or skin changes. No nail dyscrasia. NEURO:  Nonfocal. Well oriented.  Appropriate affect. ECOG PERFORMANCE STATUS: 1 - Symptomatic but completely ambulatory   Lab Results: Lab Results  Component Value Date   WBC 6.1 02/13/2014   HGB 14.1 02/13/2014   HCT 41.3 02/13/2014   MCV 98.4 02/13/2014   PLT 219 02/13/2014     Chemistry      Component Value Date/Time   NA 142 02/13/2014 0816   NA 141 01/26/2011 1039   K 4.1 02/13/2014 0816   K 4.5 01/26/2011 1039   CL 100 12/20/2012 1509   CL 106 01/26/2011 1039   CO2 28 02/13/2014 0816   CO2 28 01/26/2011 1039   BUN 9.8 02/13/2014  0816   BUN 7 01/26/2011 1039   CREATININE 0.9 02/13/2014 0816   CREATININE 0.6 01/26/2011 1039      Component Value Date/Time   CALCIUM 9.6 02/13/2014 0816   CALCIUM 9.2 01/26/2011 1039   ALKPHOS 72 02/13/2014 0816   AST 16 02/13/2014 0816   ALT 12 02/13/2014 0816   BILITOT 0.36 02/13/2014 0816     Assessment and Plan: Craig Guess Moe 48 y.o. female with  Stage II triple positive invasive ductal carcinoma.  She is s/p neoadjuvant chemotherapy with Herceptin, lumpectomy and ALND, radiation, adjuvant Herceptin, and is currently taking Tamoxifen daily.    Adaisha is doing well today.  She is tolerating tamoxifen well and will continue this.  She did recently undergo mammo and ultrasound for left breast and axillary cysts.  I talked to her about this.  MRI should be considered as well if these change.    We talked about smoking cessation.  She will consider this.  I offered assistance to help with this and gave her information in her AVS.    I recommended healthy diet, exercise, and monthly breast exams.    The patient will return in 6 months for labs and follow up.  She knows to contact us in the interim for any questions or concerns  and we can certainly see her sooner if needed.  I spent 25 minutes counseling the patient face to face.  The total time spent in the appointment was 30 minutes.  Minette Headland, [place] [phone] 02/13/2014 9:08 AM

## 2014-02-17 ENCOUNTER — Telehealth (INDEPENDENT_AMBULATORY_CARE_PROVIDER_SITE_OTHER): Payer: Self-pay

## 2014-02-17 NOTE — Telephone Encounter (Signed)
Message copied by [name] on Mon Feb 17, 2014  9:00 AM ------      Message from: Excell Seltzer T      Created: Tue Feb 11, 2014  8:49 PM       Please call patient "Imaging looks OK, unchanged" ------

## 2014-02-17 NOTE — Telephone Encounter (Signed)
Called and spoke to patient to make aware that her Imaging looks OK, unchanged.  Patient advised to call our office if she has any questions or concerns

## 2014-05-08 ENCOUNTER — Telehealth: Payer: Self-pay | Admitting: Hematology and Oncology

## 2014-05-08 ENCOUNTER — Other Ambulatory Visit: Payer: Self-pay | Admitting: *Deleted

## 2014-05-08 NOTE — Telephone Encounter (Signed)
, °

## 2014-05-09 ENCOUNTER — Encounter: Payer: Self-pay | Admitting: *Deleted

## 2014-05-29 ENCOUNTER — Telehealth: Payer: Self-pay

## 2014-05-29 NOTE — Telephone Encounter (Signed)
Provided pt fax no to send insurance form to.  Let her know we would complete the form.

## 2014-06-02 ENCOUNTER — Telehealth: Payer: Self-pay

## 2014-06-02 NOTE — Telephone Encounter (Signed)
Returned pt call re: claim form.  Per pt, faxed form to her at her work and she will forward.  Sent to scan.

## 2014-06-04 ENCOUNTER — Telehealth: Payer: Self-pay

## 2014-06-04 NOTE — Telephone Encounter (Signed)
Claim form faxed to pt at [facility].  Sent to scan.

## 2014-06-10 ENCOUNTER — Other Ambulatory Visit: Payer: Self-pay | Admitting: Adult Health

## 2014-06-10 DIAGNOSIS — C50212 Malignant neoplasm of upper-inner quadrant of left female breast: Secondary | ICD-10-CM

## 2014-06-19 ENCOUNTER — Other Ambulatory Visit: Payer: Self-pay | Admitting: *Deleted

## 2014-08-11 ENCOUNTER — Other Ambulatory Visit: Payer: Self-pay | Admitting: *Deleted

## 2014-08-11 DIAGNOSIS — C50219 Malignant neoplasm of upper-inner quadrant of unspecified female breast: Secondary | ICD-10-CM

## 2014-08-12 ENCOUNTER — Telehealth: Payer: Self-pay | Admitting: Hematology and Oncology

## 2014-08-12 ENCOUNTER — Ambulatory Visit (HOSPITAL_BASED_OUTPATIENT_CLINIC_OR_DEPARTMENT_OTHER): Payer: 59 | Admitting: Hematology and Oncology

## 2014-08-12 ENCOUNTER — Other Ambulatory Visit (HOSPITAL_BASED_OUTPATIENT_CLINIC_OR_DEPARTMENT_OTHER): Payer: 59

## 2014-08-12 VITALS — BP 108/53 | HR 73 | Temp 98.0°F | Resp 18 | Ht 62.0 in | Wt 120.0 lb

## 2014-08-12 DIAGNOSIS — M549 Dorsalgia, unspecified: Secondary | ICD-10-CM | POA: Diagnosis not present

## 2014-08-12 DIAGNOSIS — C50219 Malignant neoplasm of upper-inner quadrant of unspecified female breast: Secondary | ICD-10-CM

## 2014-08-12 DIAGNOSIS — C50812 Malignant neoplasm of overlapping sites of left female breast: Secondary | ICD-10-CM

## 2014-08-12 DIAGNOSIS — Z170 Estrogen receptor positive status [ER+]: Secondary | ICD-10-CM | POA: Diagnosis not present

## 2014-08-12 DIAGNOSIS — C50212 Malignant neoplasm of upper-inner quadrant of left female breast: Secondary | ICD-10-CM

## 2014-08-12 LAB — COMPREHENSIVE METABOLIC PANEL (CC13)
ALBUMIN: 3.8 g/dL (ref 3.5–5.0)
ALK PHOS: 68 U/L (ref 40–150)
ALT: 11 U/L (ref 0–55)
AST: 15 U/L (ref 5–34)
Anion Gap: 9 mEq/L (ref 3–11)
BUN: 10.1 mg/dL (ref 7.0–26.0)
CO2: 26 meq/L (ref 22–29)
Calcium: 9.3 mg/dL (ref 8.4–10.4)
Chloride: 107 mEq/L (ref 98–109)
Creatinine: 0.9 mg/dL (ref 0.6–1.1)
EGFR: 81 mL/min/{1.73_m2} — ABNORMAL LOW (ref >90)
Glucose: 94 mg/dl (ref 70–140)
Potassium: 4.6 mEq/L (ref 3.5–5.1)
SODIUM: 142 meq/L (ref 136–145)
TOTAL PROTEIN: 7.1 g/dL (ref 6.4–8.3)
Total Bilirubin: 0.25 mg/dL (ref 0.20–1.20)

## 2014-08-12 LAB — CBC WITH DIFFERENTIAL/PLATELET
BASO%: 0.8 % (ref 0.0–2.0)
Basophils Absolute: 0 10*3/uL (ref 0.0–0.1)
EOS%: 4.3 % (ref 0.0–7.0)
Eosinophils Absolute: 0.2 10*3/uL (ref 0.0–0.5)
HEMATOCRIT: 40.4 % (ref 34.8–46.6)
HGB: 13.1 g/dL (ref 11.6–15.9)
LYMPH#: 2 10*3/uL (ref 0.9–3.3)
LYMPH%: 35 % (ref 14.0–49.7)
MCH: 32 pg (ref 25.1–34.0)
MCHC: 32.6 g/dL (ref 31.5–36.0)
MCV: 98.2 fL (ref 79.5–101.0)
MONO#: 0.5 10*3/uL (ref 0.1–0.9)
MONO%: 8.4 % (ref 0.0–14.0)
NEUT#: 2.9 10*3/uL (ref 1.5–6.5)
NEUT%: 51.5 % (ref 38.4–76.8)
Platelets: 228 10*3/uL (ref 145–400)
RBC: 4.11 10*6/uL (ref 3.70–5.45)
RDW: 13 % (ref 11.2–14.5)
WBC: 5.7 10*3/uL (ref 3.9–10.3)

## 2014-08-12 NOTE — Assessment & Plan Note (Signed)
Left breast invasive ductal carcinoma ER/PR positive HER-2 positive initially 3.1 cm, Ki-67 70%, HER-2 amplified ratio 2.91 status post neoadjuvant chemotherapy followed by surgery which showed 1.8 Sinemet or tumor 1 positive sentinel lymph node T1cN1 M0 stage IB status post radiation therapy and Herceptin maintenance currently on tamoxifen started 06/05/2013  Tamoxifen toxicities: No major side effects other than occasional hot flashes and muscle aches and pains which are being well managed with over-the-counter Tylenol pain medication.  Breast cancer surveillance: Patient has had mammogram and ultrasound showing cysts involve the breasts 02/11/2014. Today's breast exam was also normal. She has cysts involving the left breast which are palpated. There were not found to be tender.  Severe back pain and muscle aches and pains for the last 2 weeks: I would like to obtain a CT chest abdomen pelvis and a bone scan for further evaluation. Patient has a history of left upper lobe lung resection for a fungal infection. I will call her with the results of these tests. If these tests are normal I will plan on seeing her back in 6 months.

## 2014-08-12 NOTE — Telephone Encounter (Signed)
, °

## 2014-08-12 NOTE — Progress Notes (Signed)
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 Ki-67 was 70% HER-2 was amplified with a ratio 2.91   07/12/2012 - 07/17/2013 Neo-Adjuvant Chemotherapy TCH 6 followed by Herceptin maintenance   12/11/2012 Surgery Left breast lumpectomy: 1.8 cm tumor 1 positive sentinel node, axillary lymph node dissection 02/08/2013 showed 0/13 lymph nodes   03/25/2013 - 05/06/2013 Radiation Therapy Adjuvant radiation therapy   06/05/2013 -  Anti-estrogen oral therapy Tamoxifen 20 mg daily    CHIEF COMPLIANT: Follow-up on tamoxifen for breast cancer, diffuse bone pain involving her back  INTERVAL HISTORY: Melissa [name] is a 49 year old lady with above-mentioned history of left-sided breast cancer treated with neoadjuvant chemotherapy followed by lumpectomy and radiation and is currently on tamoxifen. She is tolerating tamoxifen fairly well except for muscle aches and pains and hot flashes. Over the past month or so she is gotten increasing bone pain especially in the upper back involving her shoulders and mid back. Patient does not know if it is related to her prior lung resection or it is related to breast cancer and side effect of tamoxifen.  REVIEW OF SYSTEMS:   Constitutional: Denies fevers, chills or abnormal weight loss Eyes: Denies blurriness of vision Ears, nose, mouth, throat, and face: Denies mucositis or sore  throat Respiratory:Shortness of breath with exertion  Cardiovascular: Denies palpitation, chest discomfort or lower extremity swelling Gastrointestinal:  Denies nausea, heartburn or change in bowel habits Skin: Denies abnormal skin rashes Lymphatics: Denies new lymphadenopathy or easy bruising Neurological:Denies numbness, tingling or new weaknesses Behavioral/Psych: Mood is stable, no new changes  Breast: Pain in the left breast related to cysts  All other systems were reviewed with the patient and are negative.  I have reviewed the past medical history, past surgical history, social history and family history with the patient and they are unchanged from previous note.  ALLERGIES:  is allergic to aspirin and iodinated diagnostic agents.  MEDICATIONS:  Current Outpatient Prescriptions  Medication Sig Dispense Refill  . albuterol (VENTOLIN HFA) 108 (90 BASE) MCG/ACT inhaler Inhale 2 puffs into the lungs every 6 (six) hours as needed. 1 Inhaler 3  . amoxicillin (AMOXIL) 500 MG tablet Take 500 mg by mouth 2 (two) times daily.    . fluticasone (FLONASE) 50 MCG/ACT nasal spray Place 2 sprays into the nose daily as needed.    Marland Kitchen HYDROcodone-acetaminophen (NORCO) 5-325 MG per tablet Take 1-2 tablets by mouth every 6 (six) hours as needed. 20 tablet 0  . tamoxifen (NOLVADEX) 20 MG tablet TAKE 1 TABLET BY MOUTH EVERY DAY 30 tablet 2   No current facility-administered medications for this visit.    PHYSICAL EXAMINATION: ECOG PERFORMANCE STATUS: 1 - Symptomatic but completely ambulatory  Filed Vitals:   08/12/14 1009  BP: 108/53  Pulse: 73  Temp: 98 F (36.7 C)  Resp: 18   Filed Weights   08/12/14 1009  Weight: 120 lb (54.432 kg)    GENERAL:alert, no distress and comfortable SKIN: skin color, texture, turgor are normal, no rashes or significant lesions EYES: normal, Conjunctiva are pink and non-injected, sclera clear OROPHARYNX:no exudate, no erythema and lips, buccal mucosa, and  tongue normal  NECK: supple, thyroid normal size, non-tender, without nodularity LYMPH:  no palpable lymphadenopathy in the cervical, axillary or inguinal LUNGS: clear to auscultation and percussion with normal breathing effort HEART: regular rate & rhythm and no murmurs and no lower extremity edema ABDOMEN:abdomen soft, non-tender and normal bowel sounds Musculoskeletal:no cyanosis of digits and no clubbing  NEURO: alert & oriented x 3 with fluent speech, no focal motor/sensory deficits BREAST: No palpable masses or nodules in either right breasts. Left breast cysts are palpable. No palpable axillary supraclavicular or infraclavicular adenopathy no breast tenderness or nipple discharge. (exam performed in the presence of a chaperone)  LABORATORY DATA:  I have reviewed the data as listed   Chemistry      Component Value Date/Time   NA 142 08/12/2014 0956   NA 141 01/26/2011 1039   K 4.6 08/12/2014 0956   K 4.5 01/26/2011 1039   CL 100 12/20/2012 1509   CL 106 01/26/2011 1039   CO2 26 08/12/2014 0956   CO2 28 01/26/2011 1039   BUN 10.1 08/12/2014 0956   BUN 7 01/26/2011 1039   CREATININE 0.9 08/12/2014 0956   CREATININE 0.6 01/26/2011 1039      Component Value Date/Time   CALCIUM 9.3 08/12/2014 0956   CALCIUM 9.2 01/26/2011 1039   ALKPHOS 68 08/12/2014 0956   AST 15 08/12/2014 0956   ALT 11 08/12/2014 0956   BILITOT 0.25 08/12/2014 0956       Lab Results  Component Value Date   WBC 5.7 08/12/2014   HGB 13.1 08/12/2014   HCT 40.4 08/12/2014   MCV 98.2 08/12/2014   PLT 228 08/12/2014   NEUTROABS 2.9 08/12/2014     RADIOGRAPHIC STUDIES: I have personally reviewed the radiology reports and agreed with their findings. 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  Breast cancer surveillance: Patient has had mammogram and ultrasound showing cysts involve the breasts 02/11/2014. Today's breast exam was also normal. She has cysts involving the left breast which are palpated. There were not found to be tender.  Severe back pain and muscle aches and pains for the last 2 weeks: I would like to obtain a CT chest abdomen pelvis and a bone scan for further evaluation. Patient has a history of left upper lobe lung resection for a fungal infection. I will call her with the results of these tests. If these tests are normal I will plan on seeing her back in 6 months.    Orders Placed This Encounter  Procedures  . CT Abdomen Pelvis W Contrast    Standing Status: Future     Number of Occurrences:  Standing Expiration Date: 08/12/2015    Order Specific Question:  Reason for Exam (SYMPTOM  OR DIAGNOSIS REQUIRED)    Answer:  Back pain with H/O breast cancer    Order Specific Question:  Is the patient pregnant?    Answer:  No    Order Specific Question:  Preferred imaging location?    Answer:  [facility]  . CT Chest W Contrast    Standing Status: Future     Number of Occurrences:      Standing Expiration Date: 08/12/2015    Order Specific Question:  Reason for Exam (SYMPTOM  OR DIAGNOSIS REQUIRED)    Answer:  Back pain with H/O breast cancer    Order Specific Question:  Is the patient pregnant?    Answer:  No    Order Specific Question:  Preferred imaging location?    Answer:  [facility]    Standing Status: Future     Number of Occurrences:      Standing Expiration Date: 08/12/2015    Order Specific Question:  Reason for Exam (SYMPTOM  OR DIAGNOSIS REQUIRED)    Answer:  Back pain with H/O breast cancer    Order Specific Question:  Is the patient pregnant?    Answer:  No    Order Specific Question:  Preferred imaging location?    Answer:  [facility]   The patient has a good understanding of the overall plan. she agrees with it. She will call with any problems that may develop before her next visit here.   Rulon Eisenmenger, MD

## 2014-08-14 ENCOUNTER — Telehealth: Payer: Self-pay | Admitting: *Deleted

## 2014-08-14 ENCOUNTER — Telehealth: Payer: Self-pay | Admitting: Hematology and Oncology

## 2014-08-14 NOTE — Telephone Encounter (Signed)
pt left message wanting to know when she will have scans. per pt she has not heard anything. scans ordered for [facility] and pt insurance does not show in system. confirmed pt has [employer]. bone scan will need to be done @ [facility] due to [facility] no longer does bone scans. s/w [facility] and pt will be contacted after 72hr preauth waiting period. ct scan to be done at [place]. s/w Roberta @ [facility] and pt has contrast allergy and last ct scan in 2012 was w/o contrast. s/w desk nurse re reaction and severity and per desk nurse she will s/w [facility] re how to proceed and have order changed if needed. Desk nurse will get back to me.   Returned call to pt re scans and made pt aware of 72hr preauth waiting period -  that bone scan would be done at [facility] and she would be contacted by them - that ct would be done at [place] and that I would call back with an appt.   S/w Vaughan Basta in managed care and no preauth is required for this plan.

## 2014-08-14 NOTE — Telephone Encounter (Signed)
per desk nurse ok to proceed w/having ct at [facility] with contast. per desk nurse she has contacted pt and pt has been given instruction to follow for ct. s/w [place] at [facility] and pt is scheduled for 08/19/14 @ 2:10pm @ [place]. per [employer] pt must follow their protocol for the allergy using the 13hr prep. per [facility] radiologist will wright a script that pt will pick up a day or 2 prior to ct. s/w desk nurse re this requirement and also sent staff message to [place]. per desk ok to follow [facility] protocol. S/w pt re d/t/location and allergy protocol. Pt will get script tomorrow or Monday. [facility] will call pt re bone scan @ [facility] - s/w Alisa today. Pt aware and has contact info for both [facility]. Per Vaughan Basta no preauth required for this plan.

## 2014-08-14 NOTE — Telephone Encounter (Signed)
Called patient to verify reaction to contrast. Patient states that she had a rash when receiving iv contrast 15 years ago. Advised patient to take Tylenol 650 mg and Benadryl 25 mg 1 hour prior to CT scan. Patient verbalized understanding.

## 2014-08-19 ENCOUNTER — Inpatient Hospital Stay: Admission: RE | Admit: 2014-08-19 | Payer: 59 | Source: Ambulatory Visit

## 2014-08-19 ENCOUNTER — Other Ambulatory Visit: Payer: 59

## 2014-08-21 ENCOUNTER — Encounter (HOSPITAL_COMMUNITY): Payer: 59

## 2014-09-08 ENCOUNTER — Other Ambulatory Visit: Payer: Self-pay | Admitting: Hematology and Oncology

## 2014-09-13 ENCOUNTER — Encounter (HOSPITAL_COMMUNITY): Payer: Self-pay | Admitting: Emergency Medicine

## 2014-09-13 ENCOUNTER — Emergency Department (INDEPENDENT_AMBULATORY_CARE_PROVIDER_SITE_OTHER): Payer: 59

## 2014-09-13 ENCOUNTER — Emergency Department (INDEPENDENT_AMBULATORY_CARE_PROVIDER_SITE_OTHER)
Admission: EM | Admit: 2014-09-13 | Discharge: 2014-09-13 | Disposition: A | Discharge status: Home / Self Care | Payer: 59 | Source: Home / Self Care | Attending: Family Medicine | Admitting: Family Medicine

## 2014-09-13 DIAGNOSIS — J471 Bronchiectasis with (acute) exacerbation: Secondary | ICD-10-CM

## 2014-09-13 MED ORDER — ALBUTEROL SULFATE (2.5 MG/3ML) 0.083% IN NEBU
5.0000 mg | INHALATION_SOLUTION | Freq: Once | RESPIRATORY_TRACT | Status: AC
  Administered 2014-09-13: 5 mg via RESPIRATORY_TRACT

## 2014-09-13 MED ORDER — PREDNISONE 50 MG PO TABS: 50.0000 mg | ORAL_TABLET | Freq: Every day | ORAL | Status: DC

## 2014-09-13 MED ORDER — HYDROCOD POLST-CHLORPHEN POLST 10-8 MG/5ML PO LQCR: 5.0000 mL | Freq: Two times a day (BID) | ORAL | Status: DC | PRN

## 2014-09-13 MED ORDER — IPRATROPIUM BROMIDE 0.02 % IN SOLN
RESPIRATORY_TRACT | Status: AC
  Filled 2014-09-13: qty 2.5

## 2014-09-13 MED ORDER — IPRATROPIUM BROMIDE 0.02 % IN SOLN
0.5000 mg | Freq: Once | RESPIRATORY_TRACT | Status: AC
  Administered 2014-09-13: 0.5 mg via RESPIRATORY_TRACT

## 2014-09-13 MED ORDER — MOXIFLOXACIN HCL 400 MG PO TABS: 400.0000 mg | ORAL_TABLET | Freq: Every day | ORAL | Status: DC

## 2014-09-13 MED ORDER — ALBUTEROL SULFATE (2.5 MG/3ML) 0.083% IN NEBU
INHALATION_SOLUTION | RESPIRATORY_TRACT | Status: AC
  Filled 2014-09-13: qty 6

## 2014-09-13 NOTE — Discharge Instructions (Signed)
Bronchiectasis Bronchiectasis is a condition in which the airways (bronchi) are damaged and widened. This makes it difficult for the lungs to get rid of mucus. As a result, mucus gathers in the airways, and this often leads to lung infections. Infection can cause inflammation in the airways, which may further weaken and damage the bronchi.  CAUSES  Bronchiectasis may be present at birth (congenital) or may develop later in life. Sometimes there is no apparent cause. Some common causes include:  Cystic fibrosis.   Recurrent lung infections (such as pneumonia, tuberculosis, or fungal infections).  Foreign bodies or other blockages in the lungs.  Breathing in fluid, food, or other foreign objects (aspiration). SIGNS AND SYMPTOMS  Common symptoms include:  A daily cough that brings up mucus and lasts for more than 3 weeks.  Frequent lung infections (such as pneumonia, tuberculosis, or fungal infections).  Shortness of breath and wheezing.   Weakness and fatigue. DIAGNOSIS  Various tests may be done to help diagnose bronchiectasis. Tests may include:  Chest X-rays or CT scans.   Breathing tests to help determine how your lungs are working.   Sputum cultures to check for infection.   Blood tests and other tests to check for related diseases or causes, such as cystic fibrosis. TREATMENT  Treatment varies depending on the severity of the condition. Medicines may be given to loosen the mucus to be coughed up (expectorants), to relax the muscles of the air passages (bronchodilators), or to prevent or treat infections (antibiotics). Physical therapy methods may be recommended to help clear mucus from the lungs. For severe cases, surgery may be done to remove the affected part of the lung. HOME CARE INSTRUCTIONS   Get plenty of rest.   Only take over-the-counter or prescription medicines as directed by your health care provider. If antibiotic medicines were prescribed, take them as  directed. Finish them even if you start to feel better.  Avoid sedatives and antihistamines unless otherwise directed by your health care provider. These medicines tend to thicken the mucus in the lungs.   Perform any breathing exercises or techniques to clear the lungs as directed by your health care provider.  Drink enough fluids to keep your urine clear or pale yellow.  Consider using a cold steam vaporizer or humidifier in your room or home to help loosen secretions.   If the cough is worse at night, try sleeping in a semi-upright position in a recliner or using a couple of pillows.   Avoid cigarette smoke and lung irritants. If you smoke, quit.  Stay inside when pollution and ozone levels are high.   Stay current with vaccinations and immunizations.   Follow up with your health care provider as directed.  SEEK MEDICAL CARE IF:  You cough up more thick, discolored mucus (sputum) that is yellow to green in color.  You have a fever or persistent symptoms for more than 2-3 days.  You cannot control your cough and are losing sleep. SEEK IMMEDIATE MEDICAL CARE IF:   You cough up blood.   You have chest pain or increasing shortness of breath.   You have pain that is getting worse or is uncontrolled with medicines.   You have a fever and your symptoms suddenly get worse. MAKE SURE YOU:  Understand these instructions.   Will watch your condition.   Will get help right away if you are not doing well or get worse.  Document Released: 04/24/2007 Document Revised: 07/02/2013 Document Reviewed: 01/02/2013 Exit[employer] Patient Information  2015 [employer]. This information is not intended to replace advice given to you by your health care provider. Make sure you discuss any questions you have with your health care provider.

## 2014-09-13 NOTE — ED Notes (Signed)
Patient c/o persistent cough and pain across her upper back x 2 weeks. Patient reports she had an Rx for amoxicillin which she began to take but it caused her to have a yeast infection so she stopped. Reports a hx of recurrent lung infections. Patient is in NAD.

## 2014-09-13 NOTE — ED Provider Notes (Signed)
CSN: [id number]     Arrival date & time 09/13/14  1609 History   First MD Initiated Contact with Patient 09/13/14 1716     Chief Complaint  Patient presents with  . URI   (Consider location/radiation/quality/duration/timing/severity/associated sxs/prior Treatment) HPI         patient with history of breast cancer one year ago, bronchiectasis, and history of [place] fever resulting in left upper lung lobectomy, 1 pack per day smoker, presents for evaluation of cough and upper back pain with coughing. She started to have symptoms of a cold about 2 weeks ago. This has progressed up to this point. She started having back pain with the coughing at about the same time. She has a prescription for amoxicillin to take whenever she starts to get sick because she has had pneumonia several times due to her bronchiectasis, she was doing this up until a few days ago but stopped because she did not seem to be getting any better and also because she was developing a yeast infection. No fever, chills, NVD, or shortness of breath. No recent travel or sick contacts. No numbness or weakness  Past Medical History  Diagnosis Date  . Wears dentures     upper  . Hx of radiation therapy 03/25/13-05/06/13    left breast 5000 cGy/25 sessions, left breast boost 1000 cGy/5 sessions  . Headache(784.0)     due to eye strain or not eating  . History of anemia     no current problem  . Arthritis     knees and hips  . H/O coccidioidomycosis     was reason for lung lobectomy  . History of asthma     as a child  . History of breast cancer 2014    left  . Runny nose 07/30/2013    clear drainage  . History of chemotherapy     finished 07/17/2013   Past Surgical History  Procedure Laterality Date  . Lung lobectomy Left 05/1996    upper lobe - due to [facility] Fever  . Cesarean section  1995/1996  . Portacath placement  07/02/2012    Procedure: INSERTION PORT-A-CATH;  Surgeon: Edward Jolly, MD;  Location: [facility];  Service: General;  Laterality: N/A;  right  . Breast lumpectomy with needle localization and axillary sentinel lymph node bx Left 12/31/2012    Procedure: NEEDLE LOCALIZATION LEFT BREAST LUMPECTOMY AND LEFT AXILLARY SENTENIAL LYMPH NODE BX;  Surgeon: Edward Jolly, MD;  Location: [facility];  Service: General;  Laterality: Left;  . Axillary lymph node dissection Left 02/08/2013    Procedure: LEFT AXILLARY DISSECTION;  Surgeon: Edward Jolly, MD;  Location: [facility];  Service: General;  Laterality: Left;  . Breast cyst excision Right 2006  . Port-a-cath removal Right 08/02/2013    Procedure: REMOVAL PORT-A-CATH;  Surgeon: Edward Jolly, MD;  Location: [facility];  Service: General;  Laterality: Right;   Family History  Problem Relation Age of Onset  . Emphysema Mother     was a smoker  . Heart disease Mother   . Melanoma Mother     dx in her 20s  . Asthma Brother   . Breast cancer Cousin     mother's maternal cousin; dx in her 68s   History  Substance Use Topics  . Smoking status: Current Every Day Smoker -- 0.50 packs/day for 24 years    Types: Cigarettes  . Smokeless tobacco: Never Used  .  Alcohol Use: No   OB History    No data available     Review of Systems  Constitutional: Negative for fever and chills.  HENT: Positive for congestion.   Respiratory: Positive for cough and wheezing. Negative for shortness of breath.   Cardiovascular: Negative for chest pain.  Musculoskeletal: Positive for back pain.    Allergies  Aspirin and Iodinated diagnostic agents  Home Medications   Prior to Admission medications   Medication Sig Start Date End Date Taking? Authorizing Provider  albuterol (VENTOLIN HFA) 108 (90 BASE) MCG/ACT inhaler Inhale 2 puffs into the lungs every 6 (six) hours as needed. 12/20/12   Minette Headland, NP  amoxicillin (AMOXIL) 500 MG tablet Take 500 mg by mouth 2 (two) times  daily.    Historical Provider, MD  chlorpheniramine-HYDROcodone (TUSSIONEX PENNKINETIC ER) 10-8 MG/5ML LQCR Take 5 mLs by mouth every 12 (twelve) hours as needed for cough. 09/13/14   Freeman Caldron Saidi Santacroce, PA-C  fluticasone (FLONASE) 50 MCG/ACT nasal spray Place 2 sprays into the nose daily as needed. 10/04/12   Minette Headland, NP  HYDROcodone-acetaminophen (NORCO) 5-325 MG per tablet Take 1-2 tablets by mouth every 6 (six) hours as needed. 08/02/13   Excell Seltzer, MD  moxifloxacin (AVELOX) 400 MG tablet Take 1 tablet (400 mg total) by mouth daily. 09/13/14   Liam Graham, PA-C  predniSONE (DELTASONE) 50 MG tablet Take 1 tablet (50 mg total) by mouth daily with breakfast. 09/13/14   Liam Graham, PA-C  tamoxifen (NOLVADEX) 20 MG tablet TAKE 1 TABLET BY MOUTH EVERY DAY 09/10/14   Rulon Eisenmenger, MD   BP 100/65 mmHg  Pulse 77  Temp(Src) 98.2 F (36.8 C) (Oral)  Resp 18  SpO2 97% Physical Exam  Constitutional: She is oriented to person, place, and time. Vital signs are normal. She appears well-developed and well-nourished. No distress.  HENT:  Head: Normocephalic and atraumatic.  Right Ear: External ear normal.  Left Ear: External ear normal.  Nose: Nose normal.  Mouth/Throat: Oropharynx is clear and moist. No oropharyngeal exudate.  Neck: Normal range of motion.  Cardiovascular: Normal rate, regular rhythm and normal heart sounds.   Pulmonary/Chest: Effort normal. No respiratory distress. She has wheezes (diffuse expiratory wheezes).  Coarse lung sounds in the lower lung fields bilaterally  Neurological: She is alert and oriented to person, place, and time. She has normal strength. Coordination normal.  Skin: Skin is warm and dry. No rash noted. She is not diaphoretic.  Psychiatric: She has a normal mood and affect. Judgment normal.  Nursing note and vitals reviewed.   ED Course  Procedures (including critical care time) Labs Review Labs Reviewed - No data to display  Imaging  Review Dg Chest 2 View  09/13/2014   CLINICAL DATA:  Cough for 2 weeks. Left upper lobectomy 1987. History of breast cancer.  EXAM: CHEST  2 VIEW  COMPARISON:  12/20/2012  FINDINGS: Mild hyperinflation. Left axillary node dissection. Patient rotated left. Stable heart size and mediastinal contours. Left apical pleural-parenchymal scarring. No pleural effusion or pneumothorax. Similar appearance of left upper lung volume loss and architectural distortion adjacent the left side of the mediastinum. There is also an area of presumed scarring at the left lung base which is not significantly changed. The right lung is clear. No convincing evidence of left-sided acute superimposed pulmonary process.  IMPRESSION: Hyperinflation with chronic left upper and lower lung presumed treatment related scarring. No right-sided and no convincing evidence of left-sided acute  superimposed process.   Electronically Signed   By: Abigail Miyamoto M.D.   On: 09/13/2014 18:34     MDM   1. Bronchiectasis with acute exacerbation    X-ray without acute infiltrate. Her vital signs are normal. Wheezing has somewhat resolved with the breathing treatment. Treat as a COPD exacerbation. Return precautions discussed with the patient   Meds ordered this encounter  Medications  . albuterol (PROVENTIL) (2.5 MG/3ML) 0.083% nebulizer solution 5 mg    Sig:   . ipratropium (ATROVENT) nebulizer solution 0.5 mg    Sig:   . moxifloxacin (AVELOX) 400 MG tablet    Sig: Take 1 tablet (400 mg total) by mouth daily.    Dispense:  7 tablet    Refill:  0  . predniSONE (DELTASONE) 50 MG tablet    Sig: Take 1 tablet (50 mg total) by mouth daily with breakfast.    Dispense:  5 tablet    Refill:  0  . chlorpheniramine-HYDROcodone (TUSSIONEX PENNKINETIC ER) 10-8 MG/5ML LQCR    Sig: Take 5 mLs by mouth every 12 (twelve) hours as needed for cough.    Dispense:  115 mL    Refill:  [place] Ojani Berenson, PA-C 09/13/14 1845

## 2014-09-19 ENCOUNTER — Ambulatory Visit: Payer: 59 | Admitting: Internal Medicine

## 2014-09-19 ENCOUNTER — Telehealth: Payer: Self-pay

## 2014-09-19 NOTE — Telephone Encounter (Signed)
Per [facility], patient cancelled appt on 2/9 for CT CAP.  LMOVM for patient to return call to clinic.

## 2014-09-29 ENCOUNTER — Encounter: Payer: Self-pay | Admitting: Internal Medicine

## 2014-09-29 ENCOUNTER — Ambulatory Visit (INDEPENDENT_AMBULATORY_CARE_PROVIDER_SITE_OTHER): Payer: 59 | Admitting: Internal Medicine

## 2014-09-29 VITALS — BP 110/68 | HR 78 | Temp 98.0°F | Ht 62.0 in | Wt 121.4 lb

## 2014-09-29 DIAGNOSIS — F17200 Nicotine dependence, unspecified, uncomplicated: Secondary | ICD-10-CM

## 2014-09-29 DIAGNOSIS — J471 Bronchiectasis with (acute) exacerbation: Secondary | ICD-10-CM | POA: Diagnosis not present

## 2014-09-29 DIAGNOSIS — Z720 Tobacco use: Secondary | ICD-10-CM | POA: Diagnosis not present

## 2014-09-29 MED ORDER — MOMETASONE FURO-FORMOTEROL FUM 100-5 MCG/ACT IN AERO: INHALATION_SPRAY | RESPIRATORY_TRACT | Status: DC

## 2014-09-29 NOTE — Progress Notes (Signed)
Subjective:     Patient ID: Melissa [name], female   DOB: 01/24/1966 .   MRN: [id number]    Brief patient profile:  49 yowf active smoker dx'd with [place] fever in [place] requiring LULobectomy in 1997 and 3 years of antifungal rx with fluconazole (for 2 years p latter) with residual sob heavy doe and sev times a year pain in L shoulder and lower back up to couple times a year lasts up  to a couple  of weeks maybe better with abx, maybe some worse joint pains,  Sometimes some sweats, occ with nasty mucus but no blood referred to pulmonary clinic for recurrent cp 01/2011   History of Present Illness  01/26/2011 Initial pulmonary office eval cc recurrent pleuritic L ant  pain, more severe than usual episodes,  rad neck,  Onset x 2 weeks with abrupt onset,  no better on lodine> stopped.   No sweats or nasty mucus now. Some better at day of ov p rx = dex and zithromax but  some sorethroat since dex with overt hb.rec ct chest POS BRONCHIECTASIS     07/30/2012 f/u ov/Melissa [name] cc overall better, no purulent sputum or limited activity due to breathing. Minimal daytime saba use. rec Please remember to go to the x-ray department downstairs for your tests - we will call you with the results when they are available. If nasty mucus, take amoxicillin x 10 days If thick sputum > use mucinex or mucinex dm per bottle If wheeze or can't  catch your breath > ventolin 2 puffs every hours if needed The key is to stop smoking completely before smoking completely stops you!   schedule a follow up visit in 3 months but call sooner if needed with pft's   09/29/2014 f/u ov/Melissa [name] re: bronchiectasis/ baseline maint alb bid  Chief Complaint  Patient presents with  . Follow-up    Pt c/o sob, she has had to use inhaler more. She has congestion, wheezing and chest tightness with exertion.  cough onset 4 weeks acute/ already rx amox / avelox / pred x 5 days  Cough assoc with sense of pnds  But no purulent mucus at present/ some  increase nasal congestion  No obvious daytime variabilty or assoc  cp  or overt  hb symptoms. No unusual exp hx    Sleeping ok without nocturnal  or early am exacerbation  of respiratory  c/o's or need for noct saba. Also denies any obvious fluctuation of symptoms with weather or environmental changes or other aggravating or alleviating factors except as outlined above   ROS  The following are not active complaints unless bolded sore throat, dysphagia, dental problems, itching, sneezing,  nasal congestion or excess/ purulent secretions, ear ache,   fever, chills, sweats, unintended wt loss, pleuritic or exertional cp, hemoptysis,  orthopnea pnd or leg swelling, presyncope, palpitations, heartburn, abdominal pain, anorexia, nausea, vomiting, diarrhea  or change in bowel or urinary habits, change in stools or urine, dysuria,hematuria,  rash, arthralgias, visual complaints, headache, numbness weakness or ataxia or problems with walking or coordination,  change in mood/affect or memory.                Objective:   Physical Exam  Mod anxious but pleasant amb wf nad  Wt 118  02/10/11 >  05/03/2011  118 > 117 03/23/2012 >  122 07/30/2012 > 09/29/2014 121  HEENT: nl dentition, turbinates, and orophanx. Nl external ear canals without cough reflex   NECK :  without JVD/Nodes/TM/ nl carotid upstrokes bilaterally   LUNGS: no acc muscle use, coarse BS    CV:  RRR  no s3 or murmur or increase in P2, no edema   ABD:  soft and nontender with nl excursion in the supine position. No bruits or organomegaly, bowel sounds nl  MS:  warm without deformities, calf tenderness, cyanosis or clubbing    I personally reviewed images and agree with radiology impression as follows:  CXR:  09/13/14 Hyperinflation with chronic left upper and lower lung presumed treatment related scarring. No right-sided and no convincing evidence of left-sided acute superimposed process.       Assessment:

## 2014-09-29 NOTE — Patient Instructions (Signed)
dulera 100 Take 2 puffs first thing in am and then another 2 puffs about 12 hours later.   Work on inhaler technique:  relax and gently blow all the way out then take a nice smooth deep breath back in, triggering the inhaler at same time you start breathing in.  Hold for up to 5 seconds if you can.  Rinse and gargle with water when done     Only use your albuterol as a rescue medication to be used if you can't catch your breath by resting or doing a relaxed purse lip breathing pattern.  - The less you use it, the better it will work when you need it. - Ok to use up to 2 puffs  every 4 hours if you must but call for immediate appointment if use goes up over your usual need - Don't leave home without it !!  (think of it like the spare tire for your car)   The key is to stop smoking completely before smoking completely stops you!   Call if not improving for sinus CT [phone] and ask for [facility]  Please schedule a follow up office visit in 2 weeks, sooner if needed

## 2014-10-06 NOTE — Assessment & Plan Note (Addendum)
-   CT chest   01/26/2011 Status post left upper lobectomy.  Focal bronchiectasis / scarring medially in the left lung apex with  surrounding inflammatory changes  - Pneumovax 02/10/2011   Overusing saba with Symptoms   disproportionate to objective findings and not clear this is all a lung problem but pt does appear to have difficult airway management issues.   DDX of  difficult airways management all start with A and  include Adherence, Ace Inhibitors, Acid Reflux, Active Sinus Disease, Alpha 1 Antitripsin deficiency, Anxiety masquerading as Airways dz,  ABPA,  allergy(esp in young), Aspiration (esp in elderly), Adverse effects of DPI,  Active smokers, plus two Bs  = Bronchiectasis and Beta blocker use..and one C= CHF  In this case Adherence is the biggest issue and starts with  inability to use HFA effectively and also  understand that SABA treats the symptoms but doesn't get to the underlying problem (inflammation).  I used  the analogy of putting steroid cream on a rash to help explain the meaning of topical therapy and the need to get the drug to the target tissue.  The proper method of use, as well as anticipated side effects, of a metered-dose inhaler are discussed and demonstrated to the patient. Improved effectiveness after extensive coaching during this visit to a level of approximately  75% >trial of dulera    Active smoking greatest concern >see sep a/p    ? Active sinus dz > check sinus ct next   See instructions for specific recommendations which were reviewed directly with the patient who was given a copy with highlighter outlining the key components.   Needs prevnar 13

## 2014-10-06 NOTE — Assessment & Plan Note (Signed)

## 2014-10-11 ENCOUNTER — Other Ambulatory Visit: Payer: Self-pay | Admitting: Hematology and Oncology

## 2014-10-13 NOTE — Telephone Encounter (Signed)
Last OV 08/12/14.  Next OV 02/10/15.  Chart reviewed

## 2014-10-16 ENCOUNTER — Ambulatory Visit: Payer: 59 | Admitting: Internal Medicine

## 2014-11-24 ENCOUNTER — Telehealth: Payer: Self-pay | Admitting: *Deleted

## 2014-11-24 NOTE — Telephone Encounter (Signed)
Please copy all messages to my Nurses Terri or Beverlee Nims. Terri can you help with this? Thanks VG

## 2014-11-24 NOTE — Telephone Encounter (Signed)
Pt called requesting a statement or a letter from Dr. Lindi Adie indicating that Tamoxifen prescription called in to pt's pharmacy [facility] that it was for April, May, and June.  Pt stated [facility] did fill prescription for 3 months but on their statement only indicated for April.  Pt was informed by [facility] that the statement could not be redone. Pt stated her insurance [employer] needed a statement indicating specific months for Tamoxifen, or otherwise, pt will have to pay  $ 800.00  Out of pocket for medication. Pt picked up medication  # 90  As prescribed. Pt's  Phone     ( W )   [phone] ;                          ( C )     [phone]

## 2014-12-05 ENCOUNTER — Other Ambulatory Visit: Payer: Self-pay | Admitting: *Deleted

## 2014-12-05 ENCOUNTER — Telehealth: Payer: Self-pay | Admitting: *Deleted

## 2014-12-05 ENCOUNTER — Telehealth: Payer: Self-pay | Admitting: Hematology and Oncology

## 2014-12-05 DIAGNOSIS — C50212 Malignant neoplasm of upper-inner quadrant of left female breast: Secondary | ICD-10-CM

## 2014-12-05 MED ORDER — TAMOXIFEN CITRATE 20 MG PO TABS: 20.0000 mg | ORAL_TABLET | Freq: Every day | ORAL | Status: DC

## 2014-12-05 NOTE — Telephone Encounter (Signed)
Per patient refill Tamoxifen for 30 days at a time for insurance purposes.

## 2014-12-05 NOTE — Telephone Encounter (Signed)
s.w pt and connected her to radiology to r/s bone scan

## 2014-12-10 ENCOUNTER — Other Ambulatory Visit: Payer: Self-pay | Admitting: *Deleted

## 2014-12-10 DIAGNOSIS — C50212 Malignant neoplasm of upper-inner quadrant of left female breast: Secondary | ICD-10-CM

## 2014-12-10 MED ORDER — TAMOXIFEN CITRATE 20 MG PO TABS: 20.0000 mg | ORAL_TABLET | Freq: Every day | ORAL | Status: DC

## 2014-12-11 ENCOUNTER — Emergency Department (INDEPENDENT_AMBULATORY_CARE_PROVIDER_SITE_OTHER)
Admission: EM | Admit: 2014-12-11 | Discharge: 2014-12-11 | Disposition: A | Discharge status: Home / Self Care | Payer: 59 | Source: Home / Self Care | Attending: Family Medicine | Admitting: Family Medicine

## 2014-12-11 ENCOUNTER — Encounter (HOSPITAL_COMMUNITY)
Admission: RE | Admit: 2014-12-11 | Discharge: 2014-12-11 | Disposition: A | Discharge status: Home / Self Care | Payer: 59 | Source: Ambulatory Visit | Attending: Hematology and Oncology | Admitting: Hematology and Oncology

## 2014-12-11 ENCOUNTER — Encounter (HOSPITAL_COMMUNITY): Payer: Self-pay | Admitting: Emergency Medicine

## 2014-12-11 DIAGNOSIS — Z91048 Other nonmedicinal substance allergy status: Secondary | ICD-10-CM

## 2014-12-11 DIAGNOSIS — H109 Unspecified conjunctivitis: Secondary | ICD-10-CM

## 2014-12-11 DIAGNOSIS — A499 Bacterial infection, unspecified: Secondary | ICD-10-CM

## 2014-12-11 DIAGNOSIS — H1089 Other conjunctivitis: Secondary | ICD-10-CM

## 2014-12-11 DIAGNOSIS — C50212 Malignant neoplasm of upper-inner quadrant of left female breast: Secondary | ICD-10-CM | POA: Diagnosis not present

## 2014-12-11 DIAGNOSIS — Z9109 Other allergy status, other than to drugs and biological substances: Secondary | ICD-10-CM

## 2014-12-11 MED ORDER — DEXAMETHASONE 4 MG PO TABS
ORAL_TABLET | ORAL | Status: AC
  Filled 2014-12-11: qty 2

## 2014-12-11 MED ORDER — TECHNETIUM TC 99M MEDRONATE IV KIT
26.6000 | PACK | Freq: Once | INTRAVENOUS | Status: AC | PRN
  Administered 2014-12-11: 26.6 via INTRAVENOUS

## 2014-12-11 MED ORDER — DEXAMETHASONE 2 MG PO TABS
10.0000 mg | ORAL_TABLET | Freq: Once | ORAL | Status: AC
  Administered 2014-12-11: 10 mg via ORAL

## 2014-12-11 MED ORDER — DEXAMETHASONE 2 MG PO TABS
ORAL_TABLET | ORAL | Status: AC
  Filled 2014-12-11: qty 1

## 2014-12-11 MED ORDER — POLYMYXIN B-TRIMETHOPRIM 10000-0.1 UNIT/ML-% OP SOLN: 2.0000 [drp] | Freq: Four times a day (QID) | OPHTHALMIC | Status: DC

## 2014-12-11 NOTE — Discharge Instructions (Signed)
Your symptoms may have started out as an allergic response but has now turned into a bacterial infection of your eyes. Please use the new antibiotic drops as prescribed. He will also given a dose of steroid to help decrease the inflammation in your eyes and your head. Please consider also take a daily allergy profile the next few days such as Zyrtec or Allegra.

## 2014-12-11 NOTE — ED Provider Notes (Signed)
CSN: [id number]     Arrival date & time 12/11/14  1359 History   First MD Initiated Contact with Patient 12/11/14 1520     Chief Complaint  Patient presents with  . Eye Problem   (Consider location/radiation/quality/duration/timing/severity/associated sxs/prior Treatment) HPI    5 days ago developed red itchy eyes after working outside for the day. Associated with some runny nose and mild throat irritation. Tried some over-the-counter eyedrops without benefit. Saw primary care physician a couple of days ago was given Pataday. Initially this seemed to help but again is now worsening. Symptoms are constant. Copious discharge. Intermittent blurry vision. Eyes are becoming more and more irritated. Cold compresses without improvement.  Past Medical History  Diagnosis Date  . Wears dentures     upper  . Hx of radiation therapy 03/25/13-05/06/13    left breast 5000 cGy/25 sessions, left breast boost 1000 cGy/5 sessions  . Headache(784.0)     due to eye strain or not eating  . History of anemia     no current problem  . Arthritis     knees and hips  . H/O coccidioidomycosis     was reason for lung lobectomy  . History of asthma     as a child  . History of breast cancer 2014    left  . Runny nose 07/30/2013    clear drainage  . History of chemotherapy     finished 07/17/2013   Past Surgical History  Procedure Laterality Date  . Lung lobectomy Left 05/1996    upper lobe - due to [facility] Fever  . Cesarean section  1995/1996  . Portacath placement  07/02/2012    Procedure: INSERTION PORT-A-CATH;  Surgeon: Edward Jolly, MD;  Location: [place];  Service: General;  Laterality: N/A;  right  . Breast lumpectomy with needle localization and axillary sentinel lymph node bx Left 12/31/2012    Procedure: NEEDLE LOCALIZATION LEFT BREAST LUMPECTOMY AND LEFT AXILLARY SENTENIAL LYMPH NODE BX;  Surgeon: Edward Jolly, MD;  Location: [place];  Service:  General;  Laterality: Left;  . Axillary lymph node dissection Left 02/08/2013    Procedure: LEFT AXILLARY DISSECTION;  Surgeon: Edward Jolly, MD;  Location: [place];  Service: General;  Laterality: Left;  . Breast cyst excision Right 2006  . Port-a-cath removal Right 08/02/2013    Procedure: REMOVAL PORT-A-CATH;  Surgeon: Edward Jolly, MD;  Location: [place];  Service: General;  Laterality: Right;   Family History  Problem Relation Age of Onset  . Emphysema Mother     was a smoker  . Heart disease Mother   . Melanoma Mother     dx in her 19s  . Asthma Brother   . Breast cancer Cousin     mother's maternal cousin; dx in her 32s   History  Substance Use Topics  . Smoking status: Current Every Day Smoker -- 0.50 packs/day for 24 years    Types: Cigarettes  . Smokeless tobacco: Never Used  . Alcohol Use: No   OB History    No data available     Review of Systems Per HPI with all other pertinent systems negative.   Allergies  Aspirin and Iodinated diagnostic agents  Home Medications   Prior to Admission medications   Medication Sig Start Date End Date Taking? Authorizing Provider  albuterol (VENTOLIN HFA) 108 (90 BASE) MCG/ACT inhaler Inhale 2 puffs into the lungs every 6 (six) hours  as needed. 12/20/12   Minette Headland, NP  chlorpheniramine-HYDROcodone (TUSSIONEX PENNKINETIC ER) 10-8 MG/5ML LQCR Take 5 mLs by mouth every 12 (twelve) hours as needed for cough. 09/13/14   Freeman Caldron Baker, PA-C  fluticasone (FLONASE) 50 MCG/ACT nasal spray Place 2 sprays into the nose daily as needed. 10/04/12   Minette Headland, NP  HYDROcodone-acetaminophen (NORCO) 5-325 MG per tablet Take 1-2 tablets by mouth every 6 (six) hours as needed. 08/02/13   Excell Seltzer, MD  mometasone-formoterol [facility]) 100-5 MCG/ACT AERO Take 2 puffs first thing in am and then another 2 puffs about 12 hours later. 09/29/14   Tanda Rockers, MD  tamoxifen  (NOLVADEX) 20 MG tablet Take 1 tablet (20 mg total) by mouth daily. 12/10/14   Nicholas Lose, MD  trimethoprim-polymyxin b (POLYTRIM) ophthalmic solution Place 2 drops into both eyes every 6 (six) hours. Treat for 5-7 days 12/11/14   Waldemar Dickens, MD   BP 111/65 mmHg  Pulse 95  Temp(Src) 100.2 F (37.9 C) (Oral)  Resp 16  SpO2 100% Physical Exam Physical Exam  Constitutional: oriented to person, place, and time. appears well-developed and well-nourished. No distress.  HENT:  Head: Normocephalic and atraumatic.  Eyes: Bilateral conjunctival injection with associated minimal swelling. Neck: Normal range of motion.  Pharyngeal cobblestoning. Cardiovascular: RRR, no m/r/g, 2+ distal pulses,  Pulmonary/Chest: Effort normal and breath sounds normal. No respiratory distress.  Abdominal: Soft. Bowel sounds are normal. NonTTP, no distension.  Musculoskeletal: Normal range of motion. Non ttp, no effusion.  Neurological: alert and oriented to person, place, and time.  Skin: Skin is warm. No rash noted. non diaphoretic.  Psychiatric: normal mood and affect. behavior is normal. Judgment and thought content normal.   ED Course  Procedures (including critical care time) Labs Review Labs Reviewed - No data to display  Imaging Review Nm Bone Scan Whole Body  12/11/2014   CLINICAL DATA:  49 year old female with breast cancer history in severe back pain. Subsequent encounter.  EXAM: NUCLEAR MEDICINE WHOLE BODY BONE SCAN  TECHNIQUE: Whole body anterior and posterior images were obtained approximately 3 hours after intravenous injection of radiopharmaceutical.  RADIOPHARMACEUTICALS:  26.6 mCi Technetium-16m MDP IV  COMPARISON:  Chest CT 01/26/2011  FINDINGS: Expected radiotracer activity in both kidneys and the bladder.  Homogeneous radiotracer activity throughout the spine.  Homogeneous radiotracer activity elsewhere in the axial skeleton including the skull, ribs, and pelvis (small volume perineum  contamination).  Radiotracer activity about the appendicular skeleton is within normal limits bilaterally.  IMPRESSION: No findings specific for metastatic disease to bone.   Electronically Signed   By: Genevie Ann M.D.   On: 12/11/2014 14:07     MDM   1. Bacterial conjunctivitis   2. Environmental allergies      Today, start Polytrim, Decadron 10 mg given in clinic. Started daily allergy medicine such as Zyrtec or Allegra.    Waldemar Dickens, MD 12/11/14 [phone]

## 2014-12-17 ENCOUNTER — Telehealth: Payer: Self-pay | Admitting: *Deleted

## 2014-12-17 NOTE — Telephone Encounter (Signed)
Received telephone advice record from [facility], sent to scan. Rx for Tamoxifen faxed to patient.

## 2015-01-06 ENCOUNTER — Other Ambulatory Visit: Payer: Self-pay

## 2015-01-06 DIAGNOSIS — C50212 Malignant neoplasm of upper-inner quadrant of left female breast: Secondary | ICD-10-CM

## 2015-01-06 MED ORDER — TAMOXIFEN CITRATE 20 MG PO TABS: 20.0000 mg | ORAL_TABLET | Freq: Every day | ORAL | Status: DC

## 2015-01-06 NOTE — Telephone Encounter (Signed)
LMOVM - Refills sent to pharmacy.  Patient to call clinic if she has any questions.

## 2015-02-05 ENCOUNTER — Other Ambulatory Visit: Payer: Self-pay | Admitting: Hematology and Oncology

## 2015-02-05 DIAGNOSIS — Z853 Personal history of malignant neoplasm of breast: Secondary | ICD-10-CM

## 2015-02-10 ENCOUNTER — Ambulatory Visit
Admission: RE | Admit: 2015-02-10 | Discharge: 2015-02-10 | Disposition: A | Discharge status: Home / Self Care | Payer: 59 | Source: Ambulatory Visit | Attending: Hematology and Oncology | Admitting: Hematology and Oncology

## 2015-02-10 ENCOUNTER — Telehealth: Payer: Self-pay | Admitting: Hematology and Oncology

## 2015-02-10 ENCOUNTER — Ambulatory Visit (HOSPITAL_BASED_OUTPATIENT_CLINIC_OR_DEPARTMENT_OTHER): Payer: 59 | Admitting: Hematology and Oncology

## 2015-02-10 ENCOUNTER — Encounter: Payer: Self-pay | Admitting: Hematology and Oncology

## 2015-02-10 VITALS — BP 105/55 | HR 68 | Temp 98.1°F | Resp 18 | Ht 62.0 in | Wt 118.0 lb

## 2015-02-10 DIAGNOSIS — C773 Secondary and unspecified malignant neoplasm of axilla and upper limb lymph nodes: Secondary | ICD-10-CM

## 2015-02-10 DIAGNOSIS — Z853 Personal history of malignant neoplasm of breast: Secondary | ICD-10-CM

## 2015-02-10 DIAGNOSIS — C50812 Malignant neoplasm of overlapping sites of left female breast: Secondary | ICD-10-CM

## 2015-02-10 DIAGNOSIS — M545 Low back pain: Secondary | ICD-10-CM | POA: Diagnosis not present

## 2015-02-10 DIAGNOSIS — C50212 Malignant neoplasm of upper-inner quadrant of left female breast: Secondary | ICD-10-CM

## 2015-02-10 NOTE — Progress Notes (Signed)
Patient Care Team: Lottie Dawson, MD as PCP - General (Internal Medicine) [name], MD as Referring Physician (Emergency Medicine)  DIAGNOSIS: Breast cancer of upper-inner quadrant of left female breast   Staging form: Breast, AJCC 7th Edition     Clinical: Stage IIA (T2, N0, cM0) - Unsigned       Staging comments: Staged at breast conference 12.4.13      Pathologic: Stage IIA (T1c, N1, cM0) - Unsigned  SUMMARY OF ONCOLOGIC HISTORY:   Breast cancer of upper-inner quadrant of left female breast   06/08/2012 Initial Diagnosis invasive ductal carcinoma that was ER positive PR positive HER-2/neu positive measuring 3.1 cm by MRI criteria. Ki-67 was 70% HER-2 was amplified with a ratio 2.91   07/12/2012 - 07/17/2013 Neo-Adjuvant Chemotherapy TCH 6 followed by Herceptin maintenance   12/11/2012 Surgery Left breast lumpectomy: 1.8 cm tumor 1 positive sentinel node, axillary lymph node dissection 02/08/2013 showed 0/13 lymph nodes   03/25/2013 - 05/06/2013 Radiation Therapy Adjuvant radiation therapy   06/05/2013 -  Anti-estrogen oral therapy Tamoxifen 20 mg daily    CHIEF COMPLIANT: Follow-up on tamoxifen  INTERVAL HISTORY: Melissa [name] is a 49 year old with above-mentioned history of left breast cancer who is currently on adjuvant tamoxifen and is here for a six-month follow-up she reports that she has occasional cramps in the left leg. She also complains of intermittent low back pain which is actually getting better with exercise. She is otherwise doing well denies any lumps or nodules in the breast. She had a mammogram this morning which was apparently normal.  REVIEW OF SYSTEMS:   Constitutional: Denies fevers, chills or abnormal weight loss Eyes: Denies blurriness of vision Ears, nose, mouth, throat, and face: Denies mucositis or sore throat Respiratory: Denies cough, dyspnea or wheezes Cardiovascular: Denies palpitation, chest discomfort or lower extremity  swelling Gastrointestinal:  Denies nausea, heartburn or change in bowel habits Skin: Denies abnormal skin rashes Lymphatics: Denies new lymphadenopathy or easy bruising Neurological:Denies numbness, tingling or new weaknesses, low back pain Behavioral/Psych: Mood is stable, no new changes  Breast:  denies any pain or lumps or nodules in either breasts All other systems were reviewed with the patient and are negative.  I have reviewed the past medical history, past surgical history, social history and family history with the patient and they are unchanged from previous note.  ALLERGIES:  is allergic to aspirin and iodinated diagnostic agents.  MEDICATIONS:  Current Outpatient Prescriptions  Medication Sig Dispense Refill  . albuterol (VENTOLIN HFA) 108 (90 BASE) MCG/ACT inhaler Inhale 2 puffs into the lungs every 6 (six) hours as needed. 1 Inhaler 3  . fluticasone (FLONASE) 50 MCG/ACT nasal spray Place 2 sprays into the nose daily as needed.    . mometasone-formoterol (DULERA) 100-5 MCG/ACT AERO Take 2 puffs first thing in am and then another 2 puffs about 12 hours later.    . tamoxifen (NOLVADEX) 20 MG tablet Take 1 tablet (20 mg total) by mouth daily. 30 tablet 9   No current facility-administered medications for this visit.    PHYSICAL EXAMINATION: ECOG PERFORMANCE STATUS: 1 - Symptomatic but completely ambulatory  Filed Vitals:   02/10/15 0953  BP: 105/55  Pulse: 68  Temp: 98.1 F (36.7 C)  Resp: 18   Filed Weights   02/10/15 0953  Weight: 118 lb (53.524 kg)    GENERAL:alert, no distress and comfortable SKIN: skin color, texture, turgor are normal, no rashes or significant lesions EYES: normal, Conjunctiva are pink and non-injected,  sclera clear OROPHARYNX:no exudate, no erythema and lips, buccal mucosa, and tongue normal  NECK: supple, thyroid normal size, non-tender, without nodularity LYMPH:  no palpable lymphadenopathy in the cervical, axillary or inguinal LUNGS:  clear to auscultation and percussion with normal breathing effort HEART: regular rate & rhythm and no murmurs and no lower extremity edema ABDOMEN:abdomen soft, non-tender and normal bowel sounds Musculoskeletal:no cyanosis of digits and no clubbing  NEURO: alert & oriented x 3 with fluent speech, no focal motor/sensory deficits BREAST: No palpable masses or nodules in either right or left breasts. No palpable axillary supraclavicular or infraclavicular adenopathy or nipple discharge. Slight tenderness involving the left breast (exam performed in the presence of a chaperone)  LABORATORY DATA:  I have reviewed the data as listed   Chemistry      Component Value Date/Time   NA 142 08/12/2014 0956   NA 141 01/26/2011 1039   K 4.6 08/12/2014 0956   K 4.5 01/26/2011 1039   CL 100 12/20/2012 1509   CL 106 01/26/2011 1039   CO2 26 08/12/2014 0956   CO2 28 01/26/2011 1039   BUN 10.1 08/12/2014 0956   BUN 7 01/26/2011 1039   CREATININE 0.9 08/12/2014 0956   CREATININE 0.6 01/26/2011 1039      Component Value Date/Time   CALCIUM 9.3 08/12/2014 0956   CALCIUM 9.2 01/26/2011 1039   ALKPHOS 68 08/12/2014 0956   AST 15 08/12/2014 0956   ALT 11 08/12/2014 0956   BILITOT 0.25 08/12/2014 0956       Lab Results  Component Value Date   WBC 5.7 08/12/2014   HGB 13.1 08/12/2014   HCT 40.4 08/12/2014   MCV 98.2 08/12/2014   PLT 228 08/12/2014   NEUTROABS 2.9 08/12/2014     RADIOGRAPHIC STUDIES: I have personally reviewed the radiology reports and agreed with their findings. Mm Diag Breast Tomo Bilateral  02/10/2015   CLINICAL DATA:  49-year-old female with history of left breast cancer post lumpectomy in 2014 followed by radiation therapy.  EXAM: DIGITAL DIAGNOSTIC BILATERAL MAMMOGRAM WITH 3D TOMOSYNTHESIS AND CAD  COMPARISON:  Previous exam(s).  ACR Breast Density Category c: The breast tissue is heterogeneously dense, which may obscure small masses.  FINDINGS: No suspicious masses or  calcifications are seen in either breast. Postsurgical changes are present in the central upper left breast related to prior lumpectomy. A spot compression magnification tangential view of the lumpectomy site in the left breast was performed. There is no mammographic evidence of locally recurrent malignancy.  Mammographic images were processed with CAD.  IMPRESSION: No mammographic evidence of malignancy in either breast.  RECOMMENDATION: Diagnostic mammogram is suggested in 1 year. (Code:DM-B-01Y)  I have discussed the findings and recommendations with the patient. Results were also provided in writing at the conclusion of the visit. If applicable, a reminder letter will be sent to the patient regarding the next appointment.  BI-RADS CATEGORY  2: Benign.   Electronically Signed   By: Jennifer  Jarosz M.D.   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 occasional hot flashes 2. muscle   aches and pains which are being well managed with over-the-counter Tylenol pain medication  Breast Cancer Surveillance: 1. Breast exam 02/10/2015: Normal 2. Mammogram has been performed today.  Back pain: Previous bone scan done on 12/11/2014 did not show any findings for metastatic disease.  Return to clinic in 6 months for follow-up and after that is once a year  No orders of the defined types were placed in this encounter.   The patient has a good understanding of the overall plan. she agrees with it. she will call with any problems that may develop before  the next visit here.   Gudena, Vinay K, MD      

## 2015-02-10 NOTE — Telephone Encounter (Signed)
Appointments made and avs printed for patient °

## 2015-02-10 NOTE — Assessment & Plan Note (Signed)
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 occasional hot flashes 2. muscle aches and pains which are being well managed with over-the-counter Tylenol pain medication  Breast Cancer Surveillance: 1. Breast exam 02/10/2015: Normal 2. Mammogram has been performed today.  Back pain: Previous bone scan done on 12/11/2014 did not show any findings for metastatic disease.  Return to clinic in 6 months for follow-up

## 2015-02-27 ENCOUNTER — Other Ambulatory Visit: Payer: Self-pay | Admitting: Internal Medicine

## 2015-02-27 ENCOUNTER — Ambulatory Visit
Admission: RE | Admit: 2015-02-27 | Discharge: 2015-02-27 | Disposition: A | Discharge status: Home / Self Care | Payer: 59 | Source: Ambulatory Visit | Attending: Internal Medicine | Admitting: Internal Medicine

## 2015-02-27 DIAGNOSIS — R05 Cough: Secondary | ICD-10-CM

## 2015-02-27 DIAGNOSIS — R059 Cough, unspecified: Secondary | ICD-10-CM

## 2015-06-25 ENCOUNTER — Telehealth: Payer: Self-pay | Admitting: *Deleted

## 2015-06-25 NOTE — Telephone Encounter (Signed)
Patient called to see if form she faxed to [phone] for [employer] was received.  Confirmed receipt of fax with nurse.  Form is currently with provider and will be sent to [employer] when complteted.

## 2015-07-08 ENCOUNTER — Encounter: Payer: Self-pay | Admitting: Hematology and Oncology

## 2015-07-08 NOTE — Progress Notes (Signed)
Fax confirmation was left on my desk for [employer]. It was faxed to  [phone].

## 2015-08-10 NOTE — Assessment & Plan Note (Signed)
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 occasional hot flashes 2. muscle aches and pains which are being well managed with over-the-counter Tylenol pain medication  Breast Cancer Surveillance: 1. Breast exam 08/11/15: Normal 2. Mammogram has been performed today.  Back pain: Previous bone scan done on 12/11/2014 did not show any findings for metastatic disease.  Return to clinic in 1 year for follow up. 

## 2015-08-11 ENCOUNTER — Ambulatory Visit (HOSPITAL_BASED_OUTPATIENT_CLINIC_OR_DEPARTMENT_OTHER): Payer: BLUE CROSS/BLUE SHIELD | Admitting: Hematology and Oncology

## 2015-08-11 ENCOUNTER — Telehealth: Payer: Self-pay | Admitting: Hematology and Oncology

## 2015-08-11 ENCOUNTER — Encounter: Payer: Self-pay | Admitting: Hematology and Oncology

## 2015-08-11 VITALS — BP 104/67 | HR 70 | Temp 97.9°F | Resp 18 | Ht 62.0 in | Wt 122.0 lb

## 2015-08-11 DIAGNOSIS — C50212 Malignant neoplasm of upper-inner quadrant of left female breast: Secondary | ICD-10-CM | POA: Diagnosis not present

## 2015-08-11 NOTE — Progress Notes (Signed)
Unable to get in to exam room prior to MD.  No assessment performed.  

## 2015-08-11 NOTE — Progress Notes (Signed)
Patient Care Team: Lottie Dawson, MD as PCP - General (Internal Medicine) [name], MD as Referring Physician (Emergency Medicine)  DIAGNOSIS: Breast cancer of upper-inner quadrant of left female breast [facility])   Staging form: Breast, AJCC 7th Edition     Clinical: Stage IIA (T2, N0, cM0) - Unsigned       Staging comments: Staged at breast conference 12.4.13      Pathologic: Stage IIA (T1c, N1, cM0) - Unsigned   SUMMARY OF ONCOLOGIC HISTORY:   Breast cancer of upper-inner quadrant of left female breast ([place])   06/08/2012 Initial Diagnosis invasive ductal carcinoma that was ER positive PR positive HER-2/neu positive measuring 3.1 cm by MRI criteria. Ki-67 was 70% HER-2 was amplified with a ratio 2.91   07/12/2012 - 07/17/2013 Neo-Adjuvant Chemotherapy TCH 6 followed by Herceptin maintenance   12/11/2012 Surgery Left breast lumpectomy: 1.8 cm tumor 1 positive sentinel node, axillary lymph node dissection 02/08/2013 showed 0/13 lymph nodes   03/25/2013 - 05/06/2013 Radiation Therapy Adjuvant radiation therapy   06/05/2013 -  Anti-estrogen oral therapy Tamoxifen 20 mg daily    CHIEF COMPLIANT: Follow-up of left breast cancer  INTERVAL HISTORY: SAACHI [name] is a 64 with above-mentioned history of left breast cancer currently on adjuvant tamoxifen therapy and appears with tolerating it fairly well. She has very occasional hot flashes and very occasional muscle aches and pains. She is adjusted her diet and she appears to be doing quite well. She denies any lumps or nodules in the breast. She had a mammogram in August 2016 which was normal.  REVIEW OF SYSTEMS:   Constitutional: Denies fevers, chills or abnormal weight loss Eyes: Denies blurriness of vision Ears, nose, mouth, throat, and face: Denies mucositis or sore throat Respiratory: Denies cough, dyspnea or wheezes Cardiovascular: Denies palpitation, chest discomfort Gastrointestinal:  Denies nausea, heartburn or change  in bowel habits Skin: Denies abnormal skin rashes Lymphatics: Denies new lymphadenopathy or easy bruising Neurological:Denies numbness, tingling or new weaknesses Behavioral/Psych: Mood is stable, no new changes  Extremities: No lower extremity edema Breast:  denies any pain or lumps or nodules in either breasts All other systems were reviewed with the patient and are negative.  I have reviewed the past medical history, past surgical history, social history and family history with the patient and they are unchanged from previous note.  ALLERGIES:  is allergic to aspirin and iodinated diagnostic agents.  MEDICATIONS:  Current Outpatient Prescriptions  Medication Sig Dispense Refill  . albuterol (VENTOLIN HFA) 108 (90 BASE) MCG/ACT inhaler Inhale 2 puffs into the lungs every 6 (six) hours as needed. 1 Inhaler 3  . fluticasone (FLONASE) 50 MCG/ACT nasal spray Place 2 sprays into the nose daily as needed.    . mometasone-formoterol (DULERA) 100-5 MCG/ACT AERO Take 2 puffs first thing in am and then another 2 puffs about 12 hours later.    . tamoxifen (NOLVADEX) 20 MG tablet Take 1 tablet (20 mg total) by mouth daily. 30 tablet 9   No current facility-administered medications for this visit.    PHYSICAL EXAMINATION: ECOG PERFORMANCE STATUS: 0 - Asymptomatic  Filed Vitals:   08/11/15 1051  BP: 104/67  Pulse: 70  Temp: 97.9 F (36.6 C)  Resp: 18   Filed Weights   08/11/15 1051  Weight: 122 lb (55.339 kg)    GENERAL:alert, no distress and comfortable SKIN: skin color, texture, turgor are normal, no rashes or significant lesions EYES: normal, Conjunctiva are pink and non-injected, sclera clear OROPHARYNX:no exudate,  no erythema and lips, buccal mucosa, and tongue normal  NECK: supple, thyroid normal size, non-tender, without nodularity LYMPH:  no palpable lymphadenopathy in the cervical, axillary or inguinal LUNGS: clear to auscultation and percussion with normal breathing  effort HEART: regular rate & rhythm and no murmurs and no lower extremity edema ABDOMEN:abdomen soft, non-tender and normal bowel sounds MUSCULOSKELETAL:no cyanosis of digits and no clubbing  NEURO: alert & oriented x 3 with fluent speech, no focal motor/sensory deficits EXTREMITIES: No lower extremity edema BREAST: No palpable masses or nodules in either right or left breasts. Left axillary lesion in the left axilla which appears to be a cyst. No palpable axillary supraclavicular or infraclavicular adenopathy no breast tenderness or nipple discharge. (exam performed in the presence of a chaperone)  LABORATORY DATA:  I have reviewed the data as listed   Chemistry      Component Value Date/Time   NA 142 08/12/2014 0956   NA 141 01/26/2011 1039   K 4.6 08/12/2014 0956   K 4.5 01/26/2011 1039   CL 100 12/20/2012 1509   CL 106 01/26/2011 1039   CO2 26 08/12/2014 0956   CO2 28 01/26/2011 1039   BUN 10.1 08/12/2014 0956   BUN 7 01/26/2011 1039   CREATININE 0.9 08/12/2014 0956   CREATININE 0.6 01/26/2011 1039      Component Value Date/Time   CALCIUM 9.3 08/12/2014 0956   CALCIUM 9.2 01/26/2011 1039   ALKPHOS 68 08/12/2014 0956   AST 15 08/12/2014 0956   ALT 11 08/12/2014 0956   BILITOT 0.25 08/12/2014 0956       Lab Results  Component Value Date   WBC 5.7 08/12/2014   HGB 13.1 08/12/2014   HCT 40.4 08/12/2014   MCV 98.2 08/12/2014   PLT 228 08/12/2014   NEUTROABS 2.9 08/12/2014   ASSESSMENT & PLAN:  Breast cancer of upper-inner quadrant of left female breast 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 occasional hot  flashes 2. muscle aches and pains which are being well managed with over-the-counter Tylenol pain medication  Breast Cancer Surveillance: 1. Breast exam 08/11/15: Normal 2. Mammogram 02/10/2015: No mammographic evidence of malignancy in either breast, breast density category C  Back pain: Previous bone scan done on 12/11/2014 did not show any findings for metastatic disease. 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 She is very happy with her current job situation.  Return to clinic in 1 year for follow up.  No orders of the defined types were placed in this encounter.   The patient has a good understanding of the overall plan. she agrees with it. she will call with any problems that may develop before the next visit here.   Rulon Eisenmenger, MD 08/11/2015

## 2015-08-11 NOTE — Telephone Encounter (Signed)
No pof sent but patient states that he advised one year,appointment made and avs pritned and will call her if order comes and is different

## 2015-11-11 ENCOUNTER — Encounter: Payer: Self-pay | Admitting: Hematology and Oncology

## 2015-11-11 NOTE — Progress Notes (Signed)
aflac form faxed 06/25/2015 I sent to medical records

## 2015-11-12 ENCOUNTER — Other Ambulatory Visit: Payer: Self-pay | Admitting: Hematology and Oncology

## 2015-11-12 MED ORDER — TAMOXIFEN CITRATE 20 MG PO TABS: ORAL_TABLET | ORAL | Status: DC

## 2016-03-02 ENCOUNTER — Other Ambulatory Visit: Payer: Self-pay | Admitting: Hematology and Oncology

## 2016-03-02 DIAGNOSIS — Z853 Personal history of malignant neoplasm of breast: Secondary | ICD-10-CM

## 2016-05-30 ENCOUNTER — Telehealth: Payer: Self-pay

## 2016-05-30 NOTE — Telephone Encounter (Addendum)
Called pt back returning vm. No answer on both numbers provided. Left msg and call back number  Pt called back and wanted to know if we have received her [facility] cancer benefit claim form that needs signed by the MD. Told pt that did not receive anything and requested for pt to re fax form and will have MD sign form and return back to pt today or tomorrow.Pt will fax form over and fax number provided.

## 2016-05-31 ENCOUNTER — Ambulatory Visit
Admission: RE | Admit: 2016-05-31 | Discharge: 2016-05-31 | Disposition: A | Discharge status: Home / Self Care | Payer: Managed Care, Other (non HMO) | Source: Ambulatory Visit | Attending: Hematology and Oncology | Admitting: Hematology and Oncology

## 2016-05-31 DIAGNOSIS — Z853 Personal history of malignant neoplasm of breast: Secondary | ICD-10-CM

## 2016-06-13 ENCOUNTER — Ambulatory Visit (INDEPENDENT_AMBULATORY_CARE_PROVIDER_SITE_OTHER)
Admission: RE | Admit: 2016-06-13 | Discharge: 2016-06-13 | Disposition: A | Discharge status: Home / Self Care | Payer: Self-pay | Source: Ambulatory Visit | Attending: Internal Medicine | Admitting: Internal Medicine

## 2016-06-13 ENCOUNTER — Ambulatory Visit (INDEPENDENT_AMBULATORY_CARE_PROVIDER_SITE_OTHER): Payer: Self-pay | Admitting: Internal Medicine

## 2016-06-13 ENCOUNTER — Encounter: Payer: Self-pay | Admitting: Internal Medicine

## 2016-06-13 VITALS — BP 110/72 | HR 98 | Ht 62.0 in | Wt 118.6 lb

## 2016-06-13 DIAGNOSIS — J471 Bronchiectasis with (acute) exacerbation: Secondary | ICD-10-CM

## 2016-06-13 DIAGNOSIS — F17210 Nicotine dependence, cigarettes, uncomplicated: Secondary | ICD-10-CM

## 2016-06-13 MED ORDER — PREDNISONE 10 MG PO TABS: ORAL_TABLET | ORAL | 0 refills | Status: DC

## 2016-06-13 MED ORDER — AMOXICILLIN-POT CLAVULANATE 875-125 MG PO TABS: 1.0000 | ORAL_TABLET | Freq: Two times a day (BID) | ORAL | 0 refills | Status: AC

## 2016-06-13 MED ORDER — MOMETASONE FURO-FORMOTEROL FUM 100-5 MCG/ACT IN AERO: INHALATION_SPRAY | RESPIRATORY_TRACT | 11 refills | Status: DC

## 2016-06-13 NOTE — Patient Instructions (Addendum)
Augmentin 875 mg take one pill twice daily  X 10 days - take at breakfast and supper with large glass of water.  It would help reduce the usual side effects (diarrhea and yeast infections) if you ate cultured yogurt at lunch.   If breathing not better > Prednisone 10 mg take  4 each am x 2 days,   2 each am x 2 days,  1 each am x 2 days and stop   Call [facility] at [phone] to set up sinus CT if still coughing   The key is to stop smoking completely before smoking completely stops you!   Plan A = Automatic = Dulera 100 Take 2 puffs first thing in am and then another 2 puffs about 12 hours later.   Plan B = Backup Only use your albuterol as a rescue medication to be used if you can't catch your breath by resting or doing a relaxed purse lip breathing pattern.  - The less you use it, the better it will work when you need it. - Ok to use the inhaler up to 2 puffs  every 4 hours if you must but call for appointment if use goes up over your usual need - Don't leave home without it !!  (think of it like the spare tire for your car)   Please schedule a follow up office visit in 6 weeks, call sooner if needed with pfts on return

## 2016-06-13 NOTE — Progress Notes (Signed)
Subjective:     Patient ID: Melissa [name], female   DOB: Nov 25, 1965 .   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 and 3 years of antifungal rx with fluconazole (for 2 years p latter) with residual sob heavy doe and sev times a year pain in L shoulder and lower back up to couple times a year lasts up  to a couple  of weeks maybe better with abx, maybe some worse joint pains,  Sometimes some sweats, occ with nasty mucus but no blood referred to pulmonary clinic for recurrent cp 01/2011 with documented Bronchiectasis in 2012    History of Present Illness  01/26/2011 Initial pulmonary office eval cc recurrent pleuritic L ant  pain, more severe than usual episodes,  rad neck,  Onset x 2 weeks with abrupt onset,  no better on lodine> stopped.   No sweats or nasty mucus now. Some better at day of ov p rx = dex and zithromax but  some sorethroat since dex with overt hb. rec ct chest POS BRONCHIECTASIS     07/30/2012 f/u ov/Melissa [name] cc overall better, no purulent sputum or limited activity due to breathing. Minimal daytime saba use. rec If nasty mucus, take amoxicillin x 10 days If thick sputum > use mucinex or mucinex dm per bottle If wheeze or can't  catch your breath > ventolin 2 puffs every hours if needed The key is to stop smoking completely before smoking completely stops you!   schedule a follow up visit in 3 months but call sooner if needed with pft's   09/29/2014 f/u ov/Melissa [name] re: bronchiectasis/ baseline maint alb bid  Chief Complaint  Patient presents with  . Follow-up    Pt c/o sob, she has had to use inhaler more. She has congestion, wheezing and chest tightness with exertion.  cough onset 4 weeks acute/ already rx amox / avelox / pred x 5 days  Cough assoc with sense of pnds  But no purulent mucus at present/ some increase nasal congestion rec dulera 100 Take 2 puffs first thing in am and then another 2  puffs about 12 hours later.  Work on inhaler technique:  relax and gently blow all the way out then take a nice smooth deep breath back in, triggering the inhaler at same time you start breathing in.  Hold for up to 5 seconds if you can.  Rinse and gargle with water when done Only use your albuterol  The key is to stop smoking completely before smoking completely stops you!  call if not improving for sinus CT [id number] and ask for Melissa [name]     06/13/2016 acute extended ov/Melissa [name] re: bronchiectasis/ recurrent cough/ smoker   Chief Complaint  Patient presents with  . Acute Visit    Pt. c/o of coughing with yellowish mucus, Pt. has been wheezing, Has needed to use her recue inhaler mainly at night, Pt. denies chest pain,fever  worse since early October 2017 on dulera 100 2 each am only / still smoking and assoc nasal congestion/discolored drainage  Not limited by breathing from desired activities    No obvious day to day or daytime variability or assoc  mucus plugs or hemoptysis or cp or chest tightness,   or overt hb symptoms. No unusual exp hx or h/o childhood pna/ asthma or knowledge of premature birth.  Sleeping ok without nocturnal  or early am exacerbation  of respiratory  c/o's or need for noct saba. Also denies any obvious fluctuation of symptoms with weather or environmental changes or other aggravating or alleviating factors except as outlined above   Current Medications, Allergies, Complete Past Medical History, Past Surgical History, Family History, and Social History were reviewed in [employer] record.  ROS  The following are not active complaints unless bolded sore throat, dysphagia, dental problems, itching, sneezing,  nasal congestion or excess/ purulent secretions, ear ache,   fever, chills, sweats, unintended wt loss, classically pleuritic or exertional cp,  orthopnea pnd or leg swelling, presyncope, palpitations, abdominal pain, anorexia, nausea, vomiting,  diarrhea  or change in bowel or bladder habits, change in stools or urine, dysuria,hematuria,  rash, arthralgias, visual complaints, headache, numbness, weakness or ataxia or problems with walking or coordination,  change in mood/affect or memory.                 Objective:   Physical Exam  Mod anxious but pleasant amb wf nad/ vital signs reviewed - Note on arrival 02 sats  98 % on RA    Wt 118  02/10/11 >  05/03/2011  118 > 117 03/23/2012 >  122 07/30/2012 > 09/29/2014 121> 06/13/2016   119   HEENT: upper dentures/, turbinates, and orophanx. Nl external ear canals without cough reflex   NECK :  without JVD/Nodes/TM/ nl carotid upstrokes bilaterally   LUNGS: no acc muscle use,   insp pops and squeaks / minimal exp wheeze/ rhonchi bilaterally    CV:  RRR  no s3 or murmur or increase in P2, no edema   ABD:  soft and nontender with nl excursion in the supine position. No bruits or organomegaly, bowel sounds nl  MS:  warm without deformities, calf tenderness, cyanosis or clubbing    CXR PA and Lateral:   06/13/2016 :    I personally reviewed images and agree with radiology impression as follows:    Stable postsurgical changes left upper lobe. Stable cavitary nodular density left base. No acute pulmonary disease.    Assessment:

## 2016-06-14 NOTE — Assessment & Plan Note (Addendum)
-   CT chest   01/26/2011 Status post left upper lobectomy.  Focal bronchiectasis / scarring medially in the left lung apex with  surrounding inflammatory changes  - Pneumovax 08/12/11   - The proper method of use, as well as anticipated side effects, of a metered-dose inhaler are discussed and demonstrated to the patient. Improved effectiveness after extensive coaching during this visit to a level of approximately 90 % from a baseline of 75 % > continue dulera 100 but use at optimal levels = 2 bid   Mild flare / certainly not helpsed by active smoking (see separate a/p) assoc with AB  For now rx with increased dulera to 100 2bid and add pred x 6 days and 10 days of augmentin then sinus ct if not better

## 2016-06-14 NOTE — Assessment & Plan Note (Signed)
>   3 min Discussed the risks and costs (both direct and indirect)  of smoking relative to the benefits of quitting but patient unwilling to commit at this point to a specific quit date.    Although I don't endorse regular use of e cigs/ many pts find them helpful; however, I emphasized they should be considered a "one-way bridge" off all tobacco products.  

## 2016-06-14 NOTE — Progress Notes (Signed)
LMTCB

## 2016-06-21 NOTE — Progress Notes (Signed)
LMTCB

## 2016-06-24 NOTE — Progress Notes (Signed)
LMTCB

## 2016-07-15 ENCOUNTER — Ambulatory Visit (INDEPENDENT_AMBULATORY_CARE_PROVIDER_SITE_OTHER): Payer: Managed Care, Other (non HMO) | Admitting: Internal Medicine

## 2016-07-15 ENCOUNTER — Encounter: Payer: Self-pay | Admitting: Internal Medicine

## 2016-07-15 ENCOUNTER — Ambulatory Visit (INDEPENDENT_AMBULATORY_CARE_PROVIDER_SITE_OTHER)
Admission: RE | Admit: 2016-07-15 | Discharge: 2016-07-15 | Disposition: A | Discharge status: Home / Self Care | Payer: Managed Care, Other (non HMO) | Source: Ambulatory Visit | Attending: Internal Medicine | Admitting: Internal Medicine

## 2016-07-15 VITALS — BP 90/60 | HR 85 | Ht 62.0 in | Wt 118.0 lb

## 2016-07-15 DIAGNOSIS — F17210 Nicotine dependence, cigarettes, uncomplicated: Secondary | ICD-10-CM

## 2016-07-15 DIAGNOSIS — J471 Bronchiectasis with (acute) exacerbation: Secondary | ICD-10-CM

## 2016-07-15 MED ORDER — BUDESONIDE-FORMOTEROL FUMARATE 80-4.5 MCG/ACT IN AERO: 2.0000 | INHALATION_SPRAY | Freq: Two times a day (BID) | RESPIRATORY_TRACT | 11 refills | Status: DC

## 2016-07-15 MED ORDER — LEVOFLOXACIN 750 MG PO TABS: 750.0000 mg | ORAL_TABLET | Freq: Every day | ORAL | 0 refills | Status: DC

## 2016-07-15 NOTE — Patient Instructions (Addendum)
levaquin 750 mg one daily x 5 days   For cough / congestion > mucinex dm up to 1200 every 12 hours as needed   Plan A = Automatic = Symbicort 80 1-2 every 12 hours   Work on maintaining perfect  inhaler technique:  relax and gently blow all the way out then take a nice smooth deep breath back in, triggering the inhaler at same time you start breathing in.  Hold for up to 5 seconds if you can. Blow out thru nose. Rinse and gargle with water when done     Plan B = Backup Only use your albuterol as a rescue medication to be used if you can't catch your breath by resting or doing a relaxed purse lip breathing pattern.  - The less you use it, the better it will work when you need it. - Ok to use the inhaler up to 2 puffs  every 4 hours if you must but call for appointment if use goes up over your usual need - Don't leave home without it !!  (think of it like the spare tire for your car)     Keep appt for pfts

## 2016-07-15 NOTE — Progress Notes (Signed)
Subjective:     Patient ID: Melissa [name], female   DOB: 10/15/1965 .   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[place] requiring [employer] in 1997 and 3 years of antifungal rx with fluconazole (for 2 years p latter) with residual sob heavy doe and sev times a year pain in L shoulder and lower back up to couple times a year lasts up  to a couple  of weeks maybe better with abx, maybe some worse joint pains,  Sometimes some sweats, occ with nasty mucus but no blood referred to pulmonary clinic for recurrent cp 01/2011 with documented Bronchiectasis in 2012    History of Present Illness  01/26/2011 Initial pulmonary office eval cc recurrent pleuritic L ant  pain, more severe than usual episodes,  rad neck,  Onset x 2 weeks with abrupt onset,  no better on lodine> stopped.   No sweats or nasty mucus now. Some better at day of ov p rx = dex and zithromax but  some sorethroat since dex with overt hb. rec ct chest POS BRONCHIECTASIS     07/30/2012 f/u ov/Melissa [name] cc overall better, no purulent sputum or limited activity due to breathing. Minimal daytime saba use. rec If nasty mucus, take amoxicillin x 10 days If thick sputum > use mucinex or mucinex dm per bottle If wheeze or can't  catch your breath > ventolin 2 puffs every hours if needed The key is to stop smoking completely before smoking completely stops you!   schedule a follow up visit in 3 months but call sooner if needed with pft's   09/29/2014 f/u ov/Melissa [name] re: bronchiectasis/ baseline maint alb bid  Chief Complaint  Patient presents with  . Follow-up    Pt c/o sob, she has had to use inhaler more. She has congestion, wheezing and chest tightness with exertion.  cough onset 4 weeks acute/ already rx amox / avelox / pred x 5 days  Cough assoc with sense of pnds  But no purulent mucus at present/ some increase nasal congestion rec dulera 100 Take 2 puffs first thing in am and then another 2  puffs about 12 hours later.  Work on inhaler technique:  relax and gently blow all the way out then take a nice smooth deep breath back in, triggering the inhaler at same time you start breathing in.  Hold for up to 5 seconds if you can.  Rinse and gargle with water when done Only use your albuterol  The key is to stop smoking completely before smoking completely stops you!  call if not improving for sinus CT [id number] and ask for Melissa [name]     06/13/2016 acute extended ov/Melissa [name] re: bronchiectasis/ recurrent cough/ smoker   Chief Complaint  Patient presents with  . Acute Visit    Pt. c/o of coughing with yellowish mucus, Pt. has been wheezing, Has needed to use her recue inhaler mainly at night, Pt. denies chest pain,fever  worse since early October 2017 on dulera 100 2 each am only / still smoking and assoc nasal congestion/discolored drainage rec Augmentin 875 mg take one pill twice daily  X 10 days - take at breakfast and supper with large glass of water.  It would help reduce the usual side effects (diarrhea and yeast infections) if you ate cultured yogurt at lunch.  If breathing not better > Prednisone 10 mg take  4 each am x 2 days,   2 each  am x 2 days,  1 each am x 2 days and stop  Call Melissa [name] at [phone] to set up sinus CT if still coughing    stop smoking completely before smoking completely stops you!  Plan A = Automatic = Dulera 100 Take 2 puffs first thing in am and then another 2 puffs about 12 hours later.  Plan B = Backup Only use your albuterol as a rescue medication     07/15/2016 acute extended ov/Melissa [name] re: ? Bronchiectasis /dulera 100 2 bid maint / still smoking Chief Complaint  Patient presents with  . Acute Visit    lower right back pain x 1 wk- radiating to her right rib area over the past 2 days. She feels discomfort when she takes a deep breath.  She also c/o episodes of feeling light headed. She is not having any SOB and no more cough than usual.    acute onset one week  prior to OV  Very Similar pattern/characteristics to prev flares of bronchiectasis related cp on L side but this CP is located R post rad anteriorly / worse with breathing/ worse supine/ moderately severe No sob or need for rescue saba / some better with tylenol     No obvious day to day or daytime variability or assoc mucus plugs or hemoptysis or cp or chest tightness,   or overt hb symptoms. No unusual exp hx or h/o childhood pna/ asthma or knowledge of premature birth.  Sleeping ok without nocturnal  or early am exacerbation  of respiratory  c/o's or need for noct saba. Also denies any obvious fluctuation of symptoms with weather or environmental changes or other aggravating or alleviating factors except as outlined above   Current Medications, Allergies, Complete Past Medical History, Past Surgical History, Family History, and Social History were reviewed in [employer] record.  ROS  The following are not active complaints unless bolded sore throat, dysphagia, dental problems, itching, sneezing,  nasal congestion or excess/ purulent secretions, ear ache,   fever, chills, sweats, unintended wt loss, classically pleuritic or exertional cp,  orthopnea pnd or leg swelling, presyncope, palpitations, abdominal pain, anorexia, nausea, vomiting, diarrhea  or change in bowel or bladder habits, change in stools or urine, dysuria,hematuria,  rash, arthralgias, visual complaints, headache, numbness, weakness or ataxia or problems with walking or coordination,  change in mood/affect or memory.                 Objective:   Physical Exam  Mod anxious but pleasant amb wf nad/ vital signs reviewed - Note on arrival 02 sats  100% ln RA  Wt 118  02/10/11 >  05/03/2011  118 > 117 03/23/2012 >  122 07/30/2012 > 09/29/2014 121> 06/13/2016   119 > 07/15/2016 118   HEENT: upper dentures/, turbinates, and orophanx. Nl external ear canals without cough reflex   NECK :  without JVD/Nodes/TM/ nl  carotid upstrokes bilaterally   LUNGS: no acc muscle use,   insp pops and squeaks L >R  With exp rhonchi    CV:  RRR  no s3 or murmur or increase in P2, no edema   ABD:  soft and nontender with nl excursion in the supine position. No bruits or organomegaly, bowel sounds nl  MS:  warm without deformities, calf tenderness, cyanosis or clubbing    CXR PA and Lateral:   07/15/2016 :    I personally reviewed images and agree with radiology impression as follows:  Stable cavitary nodular density seen in left lung base. No significant change compared to prior exam.    Assessment:

## 2016-07-17 NOTE — Assessment & Plan Note (Signed)
-   CT chest   01/26/2011 Status post left upper lobectomy.  Focal bronchiectasis / scarring medially in the left lung apex with  surrounding inflammatory changes  - Pneumovax 08/12/11  - 07/15/2016  After extensive coaching HFA effectiveness =    75% symbicort 80 2bid   Acute flare of similar cp on R as has recurrently on L suggests bronchiectasis flare vs mscp from coughing   Since just recently rx with augmentin rec change this time to levaquin 750 daily x 5 days and return for f/u pfts as planned If pain not better needs CTa chest next  I had an extended discussion with the patient reviewing all relevant studies completed to date and  lasting 15 to 20 minutes of a 25 minute acute  visit  Re non -specific but potentially severe resp symptoms of unknown origin   Each maintenance medication was reviewed in detail including most importantly the difference between maintenance and prns and under what circumstances the prns are to be triggered using an action plan format that is not reflected in the computer generated alphabetically organized AVS.    Please see AVS for specific instructions unique to this visit that I personally wrote and verbalized to the the pt in detail and then reviewed with pt  by my nurse highlighting any  changes in therapy recommended at today's visit to their plan of care.

## 2016-07-17 NOTE — Assessment & Plan Note (Addendum)
>   3 min discussion I reviewed the Fletcher curve with the patient that basically indicates  if you quit smoking when your best day FEV1 is still well preserved (as is probably   the case here- pfts still pending)  it is highly unlikely you will progress to severe disease and informed the patient there was  no medication on the market that has proven to alter the curve/ its downward trajectory  or the likelihood of progression of their disease(unlike other chronic medical conditions such as atheroclerosis where we do think we can change the natural hx with risk reducing meds)    Therefore stopping smoking and maintaining abstinence is the most important aspect of care, not choice of inhalers or for that matter, doctors.    Obviously the cigarettes are affecting her [place] function as well/ advised

## 2016-07-18 ENCOUNTER — Emergency Department (HOSPITAL_COMMUNITY)
Admission: EM | Admit: 2016-07-18 | Discharge: 2016-07-18 | Discharge status: Against Medical Advice | Payer: Managed Care, Other (non HMO) | Attending: Emergency Medicine | Admitting: Emergency Medicine

## 2016-07-18 ENCOUNTER — Encounter (HOSPITAL_COMMUNITY): Payer: Self-pay | Admitting: Emergency Medicine

## 2016-07-18 ENCOUNTER — Telehealth: Payer: Self-pay | Admitting: Internal Medicine

## 2016-07-18 ENCOUNTER — Emergency Department (HOSPITAL_COMMUNITY): Payer: Managed Care, Other (non HMO)

## 2016-07-18 DIAGNOSIS — R197 Diarrhea, unspecified: Secondary | ICD-10-CM | POA: Diagnosis not present

## 2016-07-18 DIAGNOSIS — Z791 Long term (current) use of non-steroidal anti-inflammatories (NSAID): Secondary | ICD-10-CM | POA: Diagnosis not present

## 2016-07-18 DIAGNOSIS — F17210 Nicotine dependence, cigarettes, uncomplicated: Secondary | ICD-10-CM | POA: Insufficient documentation

## 2016-07-18 DIAGNOSIS — R1110 Vomiting, unspecified: Secondary | ICD-10-CM | POA: Diagnosis not present

## 2016-07-18 DIAGNOSIS — R1011 Right upper quadrant pain: Secondary | ICD-10-CM | POA: Insufficient documentation

## 2016-07-18 DIAGNOSIS — Z853 Personal history of malignant neoplasm of breast: Secondary | ICD-10-CM | POA: Diagnosis not present

## 2016-07-18 DIAGNOSIS — Z79899 Other long term (current) drug therapy: Secondary | ICD-10-CM | POA: Insufficient documentation

## 2016-07-18 DIAGNOSIS — J45909 Unspecified asthma, uncomplicated: Secondary | ICD-10-CM | POA: Diagnosis not present

## 2016-07-18 DIAGNOSIS — R0781 Pleurodynia: Secondary | ICD-10-CM | POA: Diagnosis not present

## 2016-07-18 DIAGNOSIS — R05 Cough: Secondary | ICD-10-CM | POA: Insufficient documentation

## 2016-07-18 HISTORY — DX: Acute bronchiolitis, unspecified: J21.9

## 2016-07-18 LAB — URINALYSIS, ROUTINE W REFLEX MICROSCOPIC
BILIRUBIN URINE: NEGATIVE
Glucose, UA: NEGATIVE mg/dL
Hgb urine dipstick: NEGATIVE
KETONES UR: NEGATIVE mg/dL
NITRITE: NEGATIVE
Protein, ur: NEGATIVE mg/dL
SPECIFIC GRAVITY, URINE: 1.019 (ref 1.005–1.030)
pH: 5 (ref 5.0–8.0)

## 2016-07-18 LAB — COMPREHENSIVE METABOLIC PANEL
ALBUMIN: 3.5 g/dL (ref 3.5–5.0)
ALT: 11 U/L — ABNORMAL LOW (ref 14–54)
ANION GAP: 7 (ref 5–15)
AST: 15 U/L (ref 15–41)
Alkaline Phosphatase: 46 U/L (ref 38–126)
BILIRUBIN TOTAL: 0.3 mg/dL (ref 0.3–1.2)
BUN: 11 mg/dL (ref 6–20)
CO2: 28 mmol/L (ref 22–32)
Calcium: 8.8 mg/dL — ABNORMAL LOW (ref 8.9–10.3)
Chloride: 98 mmol/L — ABNORMAL LOW (ref 101–111)
Creatinine, Ser: 0.94 mg/dL (ref 0.44–1.00)
GFR calc Af Amer: >60 mL/min (ref >60)
GFR calc non Af Amer: >60 mL/min (ref >60)
GLUCOSE: 116 mg/dL — AB (ref 65–99)
POTASSIUM: 3.4 mmol/L — AB (ref 3.5–5.1)
SODIUM: 133 mmol/L — AB (ref 135–145)
TOTAL PROTEIN: 6.9 g/dL (ref 6.5–8.1)

## 2016-07-18 LAB — PREGNANCY, URINE: PREG TEST UR: NEGATIVE

## 2016-07-18 LAB — CBC
HEMATOCRIT: 39 % (ref 36.0–46.0)
Hemoglobin: 13.5 g/dL (ref 12.0–15.0)
MCH: 32.8 pg (ref 26.0–34.0)
MCHC: 34.6 g/dL (ref 30.0–36.0)
MCV: 94.9 fL (ref 78.0–100.0)
Platelets: 203 10*3/uL (ref 150–400)
RBC: 4.11 MIL/uL (ref 3.87–5.11)
RDW: 13.9 % (ref 11.5–15.5)
WBC: 7.4 10*3/uL (ref 4.0–10.5)

## 2016-07-18 LAB — LIPASE, BLOOD: Lipase: 19 U/L (ref 11–51)

## 2016-07-18 MED ORDER — ONDANSETRON HCL 4 MG/2ML IJ SOLN: 4.0000 mg | Freq: Once | INTRAMUSCULAR | Status: DC

## 2016-07-18 MED ORDER — ACETAMINOPHEN-CODEINE #3 300-30 MG PO TABS
1.0000 | ORAL_TABLET | Freq: Once | ORAL | Status: AC
  Administered 2016-07-18: 1 via ORAL
  Filled 2016-07-18: qty 1

## 2016-07-18 MED ORDER — SODIUM CHLORIDE 0.9 % IV BOLUS (SEPSIS)
1000.0000 mL | Freq: Once | INTRAVENOUS | Status: AC
  Administered 2016-07-18: 1000 mL via INTRAVENOUS

## 2016-07-18 NOTE — Telephone Encounter (Signed)
She needs to go to ER -  Tamoxifen does increase the risk of PE and although the pain feels similar to previous episodes of bronchiectasis, PE pain can be very similar and they can sort out the vomiting too which would not be indicative of a blood clot but is also a concern

## 2016-07-18 NOTE — ED Notes (Signed)
Told patient to return with any worsening symptoms including chest pain, SOB, dizziness or fever.

## 2016-07-18 NOTE — Discharge Instructions (Signed)
As discussed, return here tomorrow morning (or sooner) for further evaluation.  Call 911 for any worsening symptoms

## 2016-07-18 NOTE — Telephone Encounter (Signed)
Discussed with pt > to ER

## 2016-07-18 NOTE — Progress Notes (Signed)
LMTCB

## 2016-07-18 NOTE — ED Provider Notes (Signed)
[facility] DEPT Provider Note   CSN: [id number] Arrival date & time: 07/18/16  1432   By signing my name below, I, Collene Leyden, attest that this documentation has been prepared under the direction and in the presence of Beverly Suriano PA-C.  Electronically Signed: Collene Leyden, Scribe. 07/18/16. 7:27 PM.  History   Chief Complaint Chief Complaint  Patient presents with  . Abdominal Pain    HPI Comments: Melissa [name] is a 51 y.o. female with a hx of bronchitis and breast cancer, (currently treated with Tamoxfen) who presents to the Emergency Department complaining of persistent right upper abdominal pain that began 3 days ago. Patient states the pain radiates under the right rib cage. Patient has associated chills, vomiting, non-productive cough, and diarrhea. Patient reports seeing her pulmonologist, and had a chest xray, but pain has continued. She denies vomiting or diarrhea today.  No modifying factors indicated. She denies any abdominal surgeries, recent radiation, fever, chills, shortness of breath, dysuria, frequency, hematuria, or breast soreness.    The history is provided by the patient. No language interpreter was used.    Past Medical History:  Diagnosis Date  . Arthritis    knees and hips  . Bronchiolitis   . H/O coccidioidomycosis    was reason for lung lobectomy  . Headache(784.0)    due to eye strain or not eating  . History of anemia    no current problem  . History of asthma    as a child  . History of breast cancer 2014   left  . History of chemotherapy    finished 07/17/2013  . Hx of radiation therapy 03/25/13-05/06/13   left breast 5000 cGy/25 sessions, left breast boost 1000 cGy/5 sessions  . Runny nose 07/30/2013   clear drainage  . Wears dentures    upper    Patient Active Problem List   Diagnosis Date Noted  . Abnormal echocardiogram 08/07/2013  . Chest tightness or pressure 08/07/2013  . Hx of radiation therapy   . Edema of left lower  extremity 11/19/2012  . Tachycardia 09/20/2012  . Breast cancer of upper-inner quadrant of left female breast ([place]) 06/08/2012  . Bronchiectasis ([place]) 01/26/2011  . Cigarette smoker 01/26/2011    Past Surgical History:  Procedure Laterality Date  . AXILLARY LYMPH NODE DISSECTION Left 02/08/2013   Procedure: LEFT AXILLARY DISSECTION;  Surgeon: Edward Jolly, MD;  Location: [facility];  Service: General;  Laterality: Left;  . BREAST CYST EXCISION Right 2006  . BREAST LUMPECTOMY WITH NEEDLE LOCALIZATION AND AXILLARY SENTINEL LYMPH NODE BX Left 12/31/2012   Procedure: NEEDLE LOCALIZATION LEFT BREAST LUMPECTOMY AND LEFT AXILLARY SENTENIAL LYMPH NODE BX;  Surgeon: Edward Jolly, MD;  Location: [facility];  Service: General;  Laterality: Left;  . CESAREAN SECTION  1995/1996  . LUNG LOBECTOMY Left 05/1996   upper lobe - due to [facility] Fever  . PORT-A-CATH REMOVAL Right 08/02/2013   Procedure: REMOVAL PORT-A-CATH;  Surgeon: Edward Jolly, MD;  Location: [place];  Service: General;  Laterality: Right;  . PORTACATH PLACEMENT  07/02/2012   Procedure: INSERTION PORT-A-CATH;  Surgeon: Edward Jolly, MD;  Location: [facility];  Service: General;  Laterality: N/A;  right    OB History    No data available       Home Medications    Prior to Admission medications   Medication Sig Start Date End Date Taking? Authorizing Provider  albuterol (VENTOLIN HFA)  108 (90 BASE) MCG/ACT inhaler Inhale 2 puffs into the lungs every 6 (six) hours as needed. 12/20/12   Minette Headland, NP  budesonide-formoterol (SYMBICORT) 80-4.5 MCG/ACT inhaler Inhale 2 puffs into the lungs 2 (two) times daily. 07/15/16   Tanda Rockers, MD  fluticasone (FLONASE) 50 MCG/ACT nasal spray Place 2 sprays into the nose daily as needed. 10/04/12   Minette Headland, NP  levofloxacin (LEVAQUIN) 750 MG tablet Take 1 tablet (750 mg total) by mouth daily. One  daily stop if develop aching in joints/ muscles 07/15/16   Tanda Rockers, MD  naproxen sodium (ANAPROX) 220 MG tablet Take 220 mg by mouth 2 (two) times daily with a meal.    Historical Provider, MD  tamoxifen (NOLVADEX) 20 MG tablet TAKE 1 TABLET(20 MG) BY MOUTH DAILY 11/12/15   Nicholas Lose, MD    Family History Family History  Problem Relation Age of Onset  . Emphysema Mother     was a smoker  . Heart disease Mother   . Melanoma Mother     dx in her 44s  . Asthma Brother   . Breast cancer Cousin     mother's maternal cousin; dx in her 72s    Social History Social History  Substance Use Topics  . Smoking status: Current Every Day Smoker    Packs/day: 0.50    Years: 24.00    Types: Cigarettes  . Smokeless tobacco: Never Used  . Alcohol use No     Allergies   Aspirin and Iodinated diagnostic agents   Review of Systems Review of Systems  Constitutional: Negative for chills and fever.  HENT: Negative for congestion and sore throat.   Respiratory: Positive for cough.   Cardiovascular: Negative for chest pain.  Gastrointestinal: Positive for abdominal pain, diarrhea and vomiting. Negative for nausea.  Genitourinary: Negative for decreased urine volume, dysuria, flank pain, hematuria and vaginal pain.  Musculoskeletal: Negative for arthralgias, joint swelling and neck pain.  Skin: Negative for rash.  Neurological: Negative for weakness and numbness.     Physical Exam Updated Vital Signs BP 110/86 (BP Location: Right Arm)   Pulse 98   Temp 98.2 F (36.8 C) (Oral)   Resp 20   Ht 5\' 2"  (1.575 m)   Wt 115 lb (52.2 kg)   SpO2 99%   BMI 21.03 kg/m   Physical Exam  Constitutional: She is oriented to person, place, and time. She appears well-developed and well-nourished.  HENT:  Head: Normocephalic and atraumatic.  Eyes: EOM are normal. Pupils are equal, round, and reactive to light.  Neck: Normal range of motion. Neck supple.  Cardiovascular: Normal rate, regular  rhythm and normal heart sounds.   No murmur heard. Pulmonary/Chest: Effort normal and breath sounds normal. No respiratory distress. She has no wheezes. She has no rales. She exhibits no tenderness.  Abdominal: Soft. She exhibits no distension. There is tenderness. There is no rebound and no guarding.  tenderness to palpation RUQ and just inferior to the base of the ribs, no guarding or rebound tenderness. No CVA tenderness  Musculoskeletal: Normal range of motion.  Neurological: She is alert and oriented to person, place, and time.  Skin: Skin is warm and dry.  Psychiatric: She has a normal mood and affect.  Nursing note and vitals reviewed.    ED Treatments / Results  DIAGNOSTIC STUDIES: Oxygen Saturation is 99% on RA, normal by my interpretation.    COORDINATION OF CARE: 7:20 PM Discussed treatment plan with  pt at bedside and pt agreed to plan.  Labs (all labs ordered are listed, but only abnormal results are displayed) Labs Reviewed  COMPREHENSIVE METABOLIC PANEL - Abnormal; Notable for the following:       Result Value   Sodium 133 (*)    Potassium 3.4 (*)    Chloride 98 (*)    Glucose, Bld 116 (*)    Calcium 8.8 (*)    ALT 11 (*)    All other components within normal limits  URINALYSIS, ROUTINE W REFLEX MICROSCOPIC - Abnormal; Notable for the following:    APPearance HAZY (*)    Leukocytes, UA SMALL (*)    Bacteria, UA RARE (*)    All other components within normal limits  LIPASE, BLOOD  CBC  PREGNANCY, URINE    EKG  EKG Interpretation None       Radiology Ct Abdomen Pelvis Wo Contrast  Result Date: 07/18/2016 CLINICAL DATA:  Right upper quadrant abdominal pain with nausea and vomiting starting 3 days ago. History of breast cancer. Oral and intravenous contrast withheld secondary to allergy. EXAM: CT ABDOMEN AND PELVIS WITHOUT CONTRAST TECHNIQUE: Multidetector CT imaging of the abdomen and pelvis was performed following the standard protocol without IV  contrast. COMPARISON:  None. FINDINGS: Lower chest: Scarring at the left lung base. Normal heart size without pericardial or pleural effusion. Hepatobiliary: Normal liver. Normal gallbladder, without biliary ductal dilatation. Pancreas: Normal, without mass or ductal dilatation. Spleen: Normal in size, without focal abnormality. Adrenals/Urinary Tract: Normal adrenal glands. No renal calculi or hydronephrosis. No hydroureter or ureteric calculi. No bladder calculi. Stomach/Bowel: Normal stomach, without wall thickening. Scattered colonic diverticula. Normal terminal ileum. The appendix is likely identified on image 58/ series 2. No right lower quadrant inflammation identified. Normal small bowel. Vascular/Lymphatic: Aortic atherosclerosis. No abdominopelvic adenopathy. Reproductive: Normal uterus and adnexa. Other: No significant free fluid. Musculoskeletal: No acute osseous abnormality. IMPRESSION: 1.  No acute process or explanation for right upper quadrant pain. 2. No evidence of metastatic disease. Electronically Signed   By: Abigail Miyamoto M.D.   On: 07/18/2016 20:45    Procedures Procedures (including critical care time)  Medications Ordered in ED Medications  sodium chloride 0.9 % bolus 1,000 mL (0 mLs Intravenous Stopped 07/18/16 2113)  acetaminophen-codeine (TYLENOL #3) 300-30 MG per tablet 1 tablet (1 tablet Oral Given 07/18/16 2204)     Initial Impression / Assessment and Plan / ED Course  I have reviewed the triage vital signs and the nursing notes.  Pertinent labs & imaging results that were available during my care of the patient were reviewed by me and considered in my medical decision making (see chart for details).  Clinical Course    Patient with RUQ pain for several days. Labs were reviewed and symptoms were concerning for gallbladder disease. Will obtain CT abdomen/pelvis. Offered nausea and pain medication.  Declined nausea medicine and only wanted a tylenol #3 for pain  CT  results reviewed, on further history pt reports having some pleuritic pain to right ribs and chest.  Vitals stable.   Discussed with Dr. Laverta Baltimore.  Given that additional history is concerning for PE,it is felt that she needs CT angio. Pt has hx of rash associated with iodinated CT contrast from the 1990's so will need to be pre medicated  2130  Pt also seen by Dr. Laverta Baltimore and care plan discussed.  Pt made aware of protocol for 4 hr wait time after pre-medication.  She does not want to stay  tonight for the procedure.  I have discussed risks of PE at length including death.  Pt verbalized understanding. Agrees to sign out AMA and to return here tomorrow morning for CT angio.  Also agrees to return sooner if sx's worsen or if she changes her mind.    Final Clinical Impressions(s) / ED Diagnoses   Final diagnoses:  Right upper quadrant abdominal pain  Chest pain, pleuritic    New Prescriptions New Prescriptions   No medications on file   I personally performed the services described in this documentation, which was scribed in my presence. The recorded information has been reviewed and is accurate.     Kem Parkinson, PA-C 07/19/16 [place], MD 07/19/16 [phone]

## 2016-07-18 NOTE — Telephone Encounter (Signed)
Pt returning call.Melissa [name] ° °

## 2016-07-18 NOTE — Telephone Encounter (Signed)
Notes Recorded by Tanda Rockers, MD on 07/15/2016 at 5:20 PM EST Call pt: Reviewed cxr and no acute change so no change in recommendations made at ov  Pt aware of results and voiced her understanding. Pt states she is still having a lot of right side pain. Pt states she developed vomiting and diarrhea yesterday. Pt is concerned that her pain is getting worse, and would like MW to know.  MW please advise. Thanks.

## 2016-07-18 NOTE — ED Triage Notes (Signed)
PT c/o RUQ pain with n/v that started x3 days ago. Last BM 07/17/16. Pt denies any urinary symptoms.

## 2016-07-18 NOTE — ED Notes (Signed)
Patient transported to CT 

## 2016-07-19 ENCOUNTER — Emergency Department (HOSPITAL_COMMUNITY): Payer: Managed Care, Other (non HMO)

## 2016-07-19 ENCOUNTER — Emergency Department (HOSPITAL_COMMUNITY)
Admission: EM | Admit: 2016-07-19 | Discharge: 2016-07-19 | Disposition: A | Discharge status: Home / Self Care | Payer: Managed Care, Other (non HMO) | Attending: Emergency Medicine | Admitting: Emergency Medicine

## 2016-07-19 ENCOUNTER — Encounter (HOSPITAL_COMMUNITY): Payer: Self-pay | Admitting: Emergency Medicine

## 2016-07-19 DIAGNOSIS — R0789 Other chest pain: Secondary | ICD-10-CM | POA: Diagnosis present

## 2016-07-19 DIAGNOSIS — Z853 Personal history of malignant neoplasm of breast: Secondary | ICD-10-CM | POA: Diagnosis not present

## 2016-07-19 DIAGNOSIS — F17210 Nicotine dependence, cigarettes, uncomplicated: Secondary | ICD-10-CM | POA: Diagnosis not present

## 2016-07-19 DIAGNOSIS — R079 Chest pain, unspecified: Secondary | ICD-10-CM

## 2016-07-19 HISTORY — DX: Unspecified asthma, uncomplicated: J45.909

## 2016-07-19 HISTORY — DX: Malignant (primary) neoplasm, unspecified: C80.1

## 2016-07-19 MED ORDER — IOPAMIDOL (ISOVUE-370) INJECTION 76%
75.0000 mL | Freq: Once | INTRAVENOUS | Status: AC | PRN
  Administered 2016-07-19: 75 mL via INTRAVENOUS

## 2016-07-19 MED ORDER — DIPHENHYDRAMINE HCL 50 MG/ML IJ SOLN
50.0000 mg | Freq: Once | INTRAMUSCULAR | Status: AC
  Administered 2016-07-19: 50 mg via INTRAVENOUS
  Filled 2016-07-19: qty 1

## 2016-07-19 MED ORDER — HYDROCORTISONE NA SUCCINATE PF 250 MG IJ SOLR
200.0000 mg | Freq: Once | INTRAMUSCULAR | Status: AC
  Administered 2016-07-19: 200 mg via INTRAVENOUS
  Filled 2016-07-19: qty 200

## 2016-07-19 MED ORDER — SODIUM CHLORIDE 0.9 % IV BOLUS (SEPSIS)
1000.0000 mL | Freq: Once | INTRAVENOUS | Status: AC
  Administered 2016-07-19: 1000 mL via INTRAVENOUS

## 2016-07-19 MED ORDER — METHYLPREDNISOLONE SODIUM SUCC 125 MG IJ SOLR: 125.0000 mg | Freq: Once | INTRAMUSCULAR | Status: DC

## 2016-07-19 NOTE — ED Provider Notes (Signed)
[facility] DEPT Provider Note   CSN: [id number] Arrival date & time: 07/19/16  [id number]   By signing my name below, I, Collene Leyden, attest that this documentation has been prepared under the direction and in the presence of Virgel Manifold, MD. Electronically Signed: Collene Leyden, Scribe. 07/19/16. 8:47 AM.  History   Chief Complaint Chief Complaint  Patient presents with  . Chest Pain    HPI Comments: Melissa [name] is a 51 y.o. female with a hx of breast cancer and bronchitis, who presents to the Emergency Department complaining of right-sided chest pain that radiates the the mid-back that began a week and a half ago. Patient states the pain is right under her rib cage. Patient reports associated pain when breathing. She reports being here yesterday, in which she had a negative CT. She couldn't stay any longer so she returned this morning for a CT angio. Patient reports taking aleve with minimal improvement. She denies any shortness of breath.   The history is provided by the patient. No language interpreter was used.    Past Medical History:  Diagnosis Date  . Arthritis    knees and hips  . Bronchiolitis   . H/O coccidioidomycosis    was reason for lung lobectomy  . Headache(784.0)    due to eye strain or not eating  . History of anemia    no current problem  . History of asthma    as a child  . History of breast cancer 2014   left  . History of chemotherapy    finished 07/17/2013  . Hx of radiation therapy 03/25/13-05/06/13   left breast 5000 cGy/25 sessions, left breast boost 1000 cGy/5 sessions  . Runny nose 07/30/2013   clear drainage  . Wears dentures    upper    Patient Active Problem List   Diagnosis Date Noted  . Abnormal echocardiogram 08/07/2013  . Chest tightness or pressure 08/07/2013  . Hx of radiation therapy   . Edema of left lower extremity 11/19/2012  . Tachycardia 09/20/2012  . Breast cancer of upper-inner quadrant of left female breast ([place])  06/08/2012  . Bronchiectasis ([place]) 01/26/2011  . Cigarette smoker 01/26/2011    Past Surgical History:  Procedure Laterality Date  . AXILLARY LYMPH NODE DISSECTION Left 02/08/2013   Procedure: LEFT AXILLARY DISSECTION;  Surgeon: Edward Jolly, MD;  Location: [place];  Service: General;  Laterality: Left;  . BREAST CYST EXCISION Right 2006  . BREAST LUMPECTOMY WITH NEEDLE LOCALIZATION AND AXILLARY SENTINEL LYMPH NODE BX Left 12/31/2012   Procedure: NEEDLE LOCALIZATION LEFT BREAST LUMPECTOMY AND LEFT AXILLARY SENTENIAL LYMPH NODE BX;  Surgeon: Edward Jolly, MD;  Location: [place];  Service: General;  Laterality: Left;  . CESAREAN SECTION  1995/1996  . LUNG LOBECTOMY Left 05/1996   upper lobe - due to [facility] Fever  . PORT-A-CATH REMOVAL Right 08/02/2013   Procedure: REMOVAL PORT-A-CATH;  Surgeon: Edward Jolly, MD;  Location: [place];  Service: General;  Laterality: Right;  . PORTACATH PLACEMENT  07/02/2012   Procedure: INSERTION PORT-A-CATH;  Surgeon: Edward Jolly, MD;  Location: [place];  Service: General;  Laterality: N/A;  right    OB History    No data available       Home Medications    Prior to Admission medications   Medication Sig Start Date End Date Taking? Authorizing Provider  albuterol (VENTOLIN HFA) 108 (90 BASE) MCG/ACT inhaler Inhale 2  puffs into the lungs every 6 (six) hours as needed. 12/20/12   Minette Headland, NP  budesonide-formoterol (SYMBICORT) 80-4.5 MCG/ACT inhaler Inhale 2 puffs into the lungs 2 (two) times daily. 07/15/16   Tanda Rockers, MD  DULERA 100-5 MCG/ACT AERO Inhale 2 puffs into the lungs every 12 (twelve) hours. 06/13/16   Historical Provider, MD  levofloxacin (LEVAQUIN) 750 MG tablet Take 1 tablet (750 mg total) by mouth daily. One daily stop if develop aching in joints/ muscles 07/15/16   Tanda Rockers, MD  naproxen sodium (ANAPROX) 220 MG tablet Take 220  mg by mouth 3 (three) times daily as needed (for pain).     Historical Provider, MD  tamoxifen (NOLVADEX) 20 MG tablet TAKE 1 TABLET(20 MG) BY MOUTH DAILY 11/12/15   Nicholas Lose, MD    Family History Family History  Problem Relation Age of Onset  . Emphysema Mother     was a smoker  . Heart disease Mother   . Melanoma Mother     dx in her 42s  . Asthma Brother   . Breast cancer Cousin     mother's maternal cousin; dx in her 63s    Social History Social History  Substance Use Topics  . Smoking status: Current Every Day Smoker    Packs/day: 0.50    Years: 24.00    Types: Cigarettes  . Smokeless tobacco: Never Used  . Alcohol use No     Allergies   Aspirin and Iodinated diagnostic agents   Review of Systems Review of Systems  Respiratory: Negative for shortness of breath.   Cardiovascular: Positive for chest pain.  Musculoskeletal: Positive for back pain.  All other systems reviewed and are negative.    Physical Exam Updated Vital Signs BP 101/59 (BP Location: Right Arm)   Pulse 80   Temp 97.9 F (36.6 C) (Oral)   Resp 20   Ht 5\' 2"  (1.575 m)   Wt 115 lb (52.2 kg)   SpO2 100%   BMI 21.03 kg/m   Physical Exam  Constitutional: She is oriented to person, place, and time. She appears well-developed and well-nourished. No distress.  HENT:  Head: Normocephalic and atraumatic.  Eyes: EOM are normal.  Neck: Normal range of motion.  Cardiovascular: Normal rate, regular rhythm and normal heart sounds.   Pulmonary/Chest: Effort normal and breath sounds normal.  Abdominal: Soft. She exhibits no distension. There is no tenderness.  Musculoskeletal: Normal range of motion.  TTP on the right costal margin.   Neurological: She is alert and oriented to person, place, and time.  Skin: Skin is warm and dry.  Psychiatric: She has a normal mood and affect. Judgment normal.  Nursing note and vitals reviewed.    ED Treatments / Results  DIAGNOSTIC STUDIES: Oxygen  Saturation is 100% on RA, normal by my interpretation.    COORDINATION OF CARE: 8:39 AM Discussed treatment plan with pt at bedside and pt agreed to plan.  Labs (all labs ordered are listed, but only abnormal results are displayed) Labs Reviewed - No data to display  EKG  EKG Interpretation None       Radiology Ct Abdomen Pelvis Wo Contrast  Result Date: 07/18/2016 CLINICAL DATA:  Right upper quadrant abdominal pain with nausea and vomiting starting 3 days ago. History of breast cancer. Oral and intravenous contrast withheld secondary to allergy. EXAM: CT ABDOMEN AND PELVIS WITHOUT CONTRAST TECHNIQUE: Multidetector CT imaging of the abdomen and pelvis was performed following the standard protocol  without IV contrast. COMPARISON:  None. FINDINGS: Lower chest: Scarring at the left lung base. Normal heart size without pericardial or pleural effusion. Hepatobiliary: Normal liver. Normal gallbladder, without biliary ductal dilatation. Pancreas: Normal, without mass or ductal dilatation. Spleen: Normal in size, without focal abnormality. Adrenals/Urinary Tract: Normal adrenal glands. No renal calculi or hydronephrosis. No hydroureter or ureteric calculi. No bladder calculi. Stomach/Bowel: Normal stomach, without wall thickening. Scattered colonic diverticula. Normal terminal ileum. The appendix is likely identified on image 58/ series 2. No right lower quadrant inflammation identified. Normal small bowel. Vascular/Lymphatic: Aortic atherosclerosis. No abdominopelvic adenopathy. Reproductive: Normal uterus and adnexa. Other: No significant free fluid. Musculoskeletal: No acute osseous abnormality. IMPRESSION: 1.  No acute process or explanation for right upper quadrant pain. 2. No evidence of metastatic disease. Electronically Signed   By: Abigail Miyamoto M.D.   On: 07/18/2016 20:45    Procedures Procedures (including critical care time)  Medications Ordered in ED Medications - No data to  display   Initial Impression / Assessment and Plan / ED Course  I have reviewed the triage vital signs and the nursing notes.  Pertinent labs & imaging results that were available during my care of the patient were reviewed by me and considered in my medical decision making (see chart for details).  Clinical Course     50yF with persistent pain along R costal margin. Doubt ACS. CT today w/o evidence of PE or dissection. Abdomen benign. It has been determined that no acute conditions requiring further emergency intervention are present at this time. The patient has been advised of the diagnosis and plan. I reviewed any labs and imaging including any potential incidental findings. We have discussed signs and symptoms that warrant return to the ED and they are listed in the discharge instructions.    Final Clinical Impressions(s) / ED Diagnoses   Final diagnoses:  Nonspecific chest pain    New Prescriptions New Prescriptions   No medications on file   I personally preformed the services scribed in my presence. The recorded information has been reviewed is accurate. Virgel Manifold, MD.    Virgel Manifold, MD 07/22/16 [phone]

## 2016-07-19 NOTE — ED Triage Notes (Signed)
Patient complaining of right sided chest pain radiating around right side into back x 1 1/2 weeks. States "I was here yesterday and they wanted me to stay and have a CT scan but I didn't want to stay any longer."

## 2016-07-20 ENCOUNTER — Telehealth: Payer: Self-pay | Admitting: Internal Medicine

## 2016-07-20 NOTE — Telephone Encounter (Signed)
There is no hairline fracture mentioned on her recent cxr  [facility]

## 2016-07-21 NOTE — Telephone Encounter (Signed)
Pt aware of results. Pt requested to reschedule f/u appt and PFT with Dr Melvyn Novas on 07/26/16. This has been moved to 08/09/16.  Nothing further needed.

## 2016-07-22 ENCOUNTER — Telehealth: Payer: Self-pay | Admitting: Internal Medicine

## 2016-07-22 DIAGNOSIS — J471 Bronchiectasis with (acute) exacerbation: Secondary | ICD-10-CM

## 2016-07-22 MED ORDER — LEVOFLOXACIN 750 MG PO TABS: 750.0000 mg | ORAL_TABLET | Freq: Every day | ORAL | 0 refills | Status: DC

## 2016-07-22 NOTE — Telephone Encounter (Signed)
That's fine - ok to refill

## 2016-07-22 NOTE — Telephone Encounter (Signed)
Spoke with pt, aware of recs.  rx sent to preferred pharmacy.  Nothing further needed.  

## 2016-07-22 NOTE — Telephone Encounter (Signed)
Spoke with pt, requesting refill on levaquin- states she has two children at home sick with the flu. Pt c/o sore throat, PND, fatigue, sometimes prod cough with white mucus.  Pt was given Levaquin X5 days on 07/15/16 ov, has completed course and is feeling some better but symptoms are still present.    Pt uses [facility] on [address].    MW please advise on refill.  Thanks!   Patient Instructions   levaquin 750 mg one daily x 5 days    For cough / congestion > mucinex dm up to 1200 every 12 hours as needed    Plan A = Automatic = Symbicort 80 1-2 every 12 hours    Work on maintaining perfect  inhaler technique:  relax and gently blow all the way out then take a nice smooth deep breath back in, triggering the inhaler at same time you start breathing in.  Hold for up to 5 seconds if you can. Blow out thru nose. Rinse and gargle with water when done      Plan B = Backup Only use your albuterol as a rescue medication to be used if you can't catch your breath by resting or doing a relaxed purse lip breathing pattern.  - The less you use it, the better it will work when you need it. - Ok to use the inhaler up to 2 puffs  every 4 hours if you must but call for appointment if use goes up over your usual need - Don't leave home without it !!  (think of it like the spare tire for your car)      Keep appt for pfts

## 2016-07-26 ENCOUNTER — Ambulatory Visit: Payer: Self-pay | Admitting: Internal Medicine

## 2016-07-26 ENCOUNTER — Ambulatory Visit (HOSPITAL_COMMUNITY): Payer: Self-pay

## 2016-07-27 ENCOUNTER — Other Ambulatory Visit: Payer: Self-pay | Admitting: Nurse Practitioner

## 2016-08-09 ENCOUNTER — Encounter: Payer: Self-pay | Admitting: Internal Medicine

## 2016-08-09 ENCOUNTER — Ambulatory Visit (INDEPENDENT_AMBULATORY_CARE_PROVIDER_SITE_OTHER): Payer: Managed Care, Other (non HMO) | Admitting: Internal Medicine

## 2016-08-09 ENCOUNTER — Ambulatory Visit (HOSPITAL_COMMUNITY)
Admission: RE | Admit: 2016-08-09 | Discharge: 2016-08-09 | Disposition: A | Discharge status: Home / Self Care | Payer: Managed Care, Other (non HMO) | Source: Ambulatory Visit | Attending: Internal Medicine | Admitting: Internal Medicine

## 2016-08-09 VITALS — BP 102/70 | HR 94 | Ht 62.0 in | Wt 118.0 lb

## 2016-08-09 DIAGNOSIS — Z23 Encounter for immunization: Secondary | ICD-10-CM

## 2016-08-09 DIAGNOSIS — R058 Other specified cough: Secondary | ICD-10-CM

## 2016-08-09 DIAGNOSIS — J471 Bronchiectasis with (acute) exacerbation: Secondary | ICD-10-CM | POA: Insufficient documentation

## 2016-08-09 DIAGNOSIS — R05 Cough: Secondary | ICD-10-CM

## 2016-08-09 DIAGNOSIS — F17210 Nicotine dependence, cigarettes, uncomplicated: Secondary | ICD-10-CM | POA: Diagnosis not present

## 2016-08-09 LAB — PULMONARY FUNCTION TEST
DL/VA % pred: 55 %
DL/VA: 2.52 ml/min/mmHg/L
DLCO UNC % PRED: 46 %
DLCO UNC: 10.08 ml/min/mmHg
FEF 25-75 PRE: 1.3 L/s
FEF 25-75 Post: 1.52 L/sec
FEF2575-%Change-Post: 16 %
FEF2575-%Pred-Post: 56 %
FEF2575-%Pred-Pre: 48 %
FEV1-%Change-Post: 5 %
FEV1-%PRED-POST: 77 %
FEV1-%Pred-Pre: 73 %
FEV1-POST: 2.05 L
FEV1-Pre: 1.94 L
FEV1FVC-%Change-Post: 1 %
FEV1FVC-%Pred-Pre: 86 %
FEV6-%CHANGE-POST: 5 %
FEV6-%PRED-POST: 87 %
FEV6-%Pred-Pre: 82 %
FEV6-PRE: 2.69 L
FEV6-Post: 2.84 L
FEV6FVC-%CHANGE-POST: 0 %
FEV6FVC-%PRED-POST: 101 %
FEV6FVC-%Pred-Pre: 101 %
FVC-%Change-Post: 4 %
FVC-%Pred-Post: 87 %
FVC-%Pred-Pre: 83 %
FVC-Post: 2.92 L
FVC-Pre: 2.79 L
POST FEV6/FVC RATIO: 98 %
Post FEV1/FVC ratio: 70 %
Pre FEV1/FVC ratio: 69 %
Pre FEV6/FVC Ratio: 99 %
RV % PRED: 124 %
RV: 2.06 L
TLC % PRED: 100 %
TLC: 4.75 L

## 2016-08-09 MED ORDER — PANTOPRAZOLE SODIUM 40 MG PO TBEC: 40.0000 mg | DELAYED_RELEASE_TABLET | Freq: Every day | ORAL | 2 refills | Status: DC

## 2016-08-09 MED ORDER — FAMOTIDINE 20 MG PO TABS: ORAL_TABLET | ORAL | 2 refills | Status: DC

## 2016-08-09 MED ORDER — FLUTTER DEVI: 0 refills | Status: DC

## 2016-08-09 MED ORDER — ALBUTEROL SULFATE (2.5 MG/3ML) 0.083% IN NEBU
2.5000 mg | INHALATION_SOLUTION | Freq: Once | RESPIRATORY_TRACT | Status: AC
  Administered 2016-08-09: 2.5 mg via RESPIRATORY_TRACT

## 2016-08-09 NOTE — Patient Instructions (Addendum)
Pantoprazole (protonix) 40 mg   Take  30-60 min before first meal of the day and Pepcid (famotidine)  20 mg one @  bedtime until return to office whenever flare of cough until back to normal for at least a week   GERD (REFLUX)  is an extremely common cause of respiratory symptoms just like yours , many times with no obvious heartburn at all.    It can be treated with medication, but also with lifestyle changes including elevation of the head of your bed (ideally with 6 inch  bed blocks),  Smoking cessation, avoidance of late meals, excessive alcohol, and avoid fatty foods, chocolate, peppermint, colas, red wine, and acidic juices such as orange juice.  NO MINT OR MENTHOL PRODUCTS SO NO COUGH DROPS  USE SUGARLESS CANDY INSTEAD (Jolley ranchers or Stover's or Life Savers) or even ice chips will also do - the key is to swallow to prevent all throat clearing. NO OIL BASED VITAMINS - use powdered substitutes.    For cough > mucinex dm 1200 mg every 12 hours and use flutter valve as much as possible  The key is to stop smoking completely before smoking completely stops you!    Please schedule a follow up visit in 3 months but call sooner if needed

## 2016-08-09 NOTE — Progress Notes (Signed)
Subjective:     Patient ID: Melissa [name], female   DOB: July 30, 1965 .   MRN: [id number]    Brief patient profile:  66 yowf active smoker dx'd with [place] in [place] requiring [place] in 1997 and 3 years of antifungal rx with fluconazole (for 2 years p latter) with residual sob heavy doe and sev times a year pain in L shoulder and lower back up to couple times a year lasts up  to a couple  of weeks maybe better with abx, maybe some worse joint pains,  Sometimes some sweats, occ with nasty mucus but no blood referred to pulmonary clinic for recurrent cp 01/2011 with documented Bronchiectasis in 2012    History of Present Illness  01/26/2011 Initial pulmonary office eval cc recurrent pleuritic L ant  pain, more severe than usual episodes,  rad neck,  Onset x 2 weeks with abrupt onset,  no better on lodine> stopped.   No sweats or nasty mucus now. Some better at day of ov p rx = dex and zithromax but  some sorethroat since dex with overt hb. rec ct chest POS BRONCHIECTASIS    06/13/2016 acute extended ov/Ciella Obi re: bronchiectasis/ recurrent cough/ smoker   Chief Complaint  Patient presents with  . Acute Visit    Pt. c/o of coughing with yellowish mucus, Pt. has been wheezing, Has needed to use her recue inhaler mainly at night, Pt. denies chest pain,fever  worse since early October 2017 on dulera 100 2 each am only / still smoking and assoc nasal congestion/discolored drainage rec Augmentin 875 mg take one pill twice daily  X 10 days - take at breakfast and supper with large glass of water.  It would help reduce the usual side effects (diarrhea and yeast infections) if you ate cultured yogurt at lunch.  If breathing not better > Prednisone 10 mg take  4 each am x 2 days,   2 each am x 2 days,  1 each am x 2 days and stop  Call [facility] at [phone] to set up sinus CT if still coughing    stop smoking completely before smoking completely stops you!  Plan A = Automatic = Dulera 100 Take 2 puffs  first thing in am and then another 2 puffs about 12 hours later.  Plan B = Backup Only use your albuterol as a rescue medication     07/15/2016 acute extended ov/Mairi Stagliano re: ? Bronchiectasis /dulera 100 2 bid maint / still smoking Chief Complaint  Patient presents with  . Acute Visit    lower right back pain x 1 wk- radiating to her right rib area over the past 2 days. She feels discomfort when she takes a deep breath.  She also c/o episodes of feeling light headed. She is not having any SOB and no more cough than usual.    acute onset one week prior to OV  Very Similar pattern/characteristics to prev flares of bronchiectasis related cp on L side but this CP is located R post rad anteriorly / worse with breathing/ worse supine/ moderately severe No sob or need for rescue saba / some better with tylenol  rec levaquin 750 mg one daily x 5 days  For cough / congestion > mucinex dm up to 1200 every 12 hours as needed  Plan A = Automatic = Symbicort 80 1-2 every 12 hours  Work on maintaining perfect  inhaler technique:  Plan B = Backup Only use your albuterol as a rescue medication  CTa  07/19/16 neg PE or explanation for cp c/w mscp from cough    08/09/2016  f/u ov/Jentry Warnell re: bronchiectasis / dulera 100 2bid/ plus feels need saba when overdoes ex/ still smoking Chief Complaint  Patient presents with  . Follow-up    PFT's done today. She continues to have right side back and rib pain.  Her breathing has improved some. She is down to 3 cigs per day.    does ok sleeping /no excess am mucus mostly clear  Cp is still present but much better with nsaids and does not req narcs    No obvious day to day or daytime variability or assoc mucus plugs or hemoptysis or cp or chest tightness,   or overt hb symptoms. No unusual exp hx or h/o childhood pna/ asthma or knowledge of premature birth.  Sleeping ok without nocturnal  or early am exacerbation  of respiratory  c/o's or need for noct saba. Also denies any  obvious fluctuation of symptoms with weather or environmental changes or other aggravating or alleviating factors except as outlined above   Current Medications, Allergies, Complete Past Medical History, Past Surgical History, Family History, and Social History were reviewed in [employer] record.  ROS  The following are not active complaints unless bolded sore throat, dysphagia, dental problems, itching, sneezing,  nasal congestion or excess/ purulent secretions, ear ache,   fever, chills, sweats, unintended wt loss, classically pleuritic or exertional cp,  orthopnea pnd or leg swelling, presyncope, palpitations, abdominal pain, anorexia, nausea, vomiting, diarrhea  or change in bowel or bladder habits, change in stools or urine, dysuria,hematuria,  rash, arthralgias, visual complaints, headache, numbness, weakness or ataxia or problems with walking or coordination,  change in mood/affect or memory.                 Objective:   Physical Exam   pleasant amb wf nad/ vital signs reviewed - - Note on arrival 02 sats  98% on RA    Wt 118  02/10/11 >  05/03/2011  118 > 117 03/23/2012 >  122 07/30/2012 > 09/29/2014 121> 06/13/2016   119 > 07/15/2016 118 > 08/09/2016  118    HEENT: upper dentures/, turbinates, and oropharynx. Nl external ear canals without cough reflex   NECK :  without JVD/Nodes/TM/ nl carotid upstrokes bilaterally   LUNGS: no acc muscle use,   Min insp pops and squeaks L >R  With min exp rhonchi also L >R    CV:  RRR  no s3 or murmur or increase in P2, no edema   ABD:  soft and nontender with nl excursion in the supine position. No bruits or organomegaly, bowel sounds nl  MS:  warm without deformities, calf tenderness, cyanosis or clubbing    CXR PA and Lateral:   07/15/2016 :    I personally reviewed images and agree with radiology impression as follows:    Stable cavitary nodular density seen in left lung base. No significant change compared to prior  exam.    Assessment:

## 2016-08-10 ENCOUNTER — Encounter: Payer: Self-pay | Admitting: Internal Medicine

## 2016-08-10 DIAGNOSIS — R05 Cough: Secondary | ICD-10-CM | POA: Insufficient documentation

## 2016-08-10 DIAGNOSIS — R058 Other specified cough: Secondary | ICD-10-CM | POA: Insufficient documentation

## 2016-08-10 NOTE — Assessment & Plan Note (Signed)
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 occasional hot flashes 2. muscle aches and pains which are being well managed with over-the-counter Tylenol pain medication  Breast Cancer Surveillance: 1. Breast exam 08/11/16: Normal 2. Mammogram 05/31/16: No mammographic evidence of malignancy in either breast, breast density category C  Back pain: Previous bone scan done on 12/11/2014 did not show any findings for metastatic disease. 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 She is very happy with her current job situation.  Return to clinic in 1 year for follow up.

## 2016-08-10 NOTE — Assessment & Plan Note (Signed)
>   3 min  Cutting down but not quite there yet. Emphasized the importance of the effects of cigarettes again on mucociliary function which is already reduced in her case especially in the left lung.

## 2016-08-10 NOTE — Assessment & Plan Note (Addendum)
Her cough has a new pattern now in that it does not flare while sleeping or associated with excess or purulent sputum in the morning which would be more typical of bronchiectasis and suggests therefore the diagnosis of Upper airway cough syndrome (previously labeled PNDS) , is  so named because it's frequently impossible to sort out how much is  CR/sinusitis with freq throat clearing (which can be related to primary GERD)   vs  causing  secondary (" extra esophageal")  GERD from wide swings in gastric pressure that occur with throat clearing, often  promoting self use of mint and menthol lozenges that reduce the lower esophageal sphincter tone and exacerbate the problem further in a cyclical fashion.   These are the same pts (now being labeled as having "irritable larynx syndrome" by some cough centers) who not infrequently have a history of having failed to tolerate ace inhibitors,  dry powder inhalers or biphosphonates or report having atypical/extraesophageal reflux symptoms that don't respond to standard doses of PPI  and are easily confused as having aecopd or asthma flares by even experienced allergists/ pulmonologists (myself included).  Recommend maximal treatment directed at reflux and cyclical coughing on a trial basis. see avs for instructions unique to this ov

## 2016-08-10 NOTE — Assessment & Plan Note (Addendum)
-   CT chest   01/26/2011 Status post left upper lobectomy.  Focal bronchiectasis / scarring medially in the left lung apex with  surrounding inflammatory changes  - Pneumovax 08/12/11  And prevnar 08/09/2016  - 07/15/2016  After extensive coaching HFA effectiveness =    75% symbicort 80 2bid  - PFT's  08/09/2016  FEV1 2.05 (77 % ) ratio 70  p 5 % improvement from saba p dulera 100 x2  prior to study with DLCO  46 % corrects to 55  % for alv volume    Adequate control on present rx, reviewed in detail with pt > no change in rx needed    Chest pain is not consistent with pulmonary embolism nor bronchiectasis flare but may have been caused by excessive coughing which she should be managing with the use of Mucinex DM and flutter valve as previously recommended.  See uacs re other sources for cough   .I had an extended discussion with the patient reviewing all relevant studies completed to date and  lasting 15 to 20 minutes of a 25 minute visit    Each maintenance medication was reviewed in detail including most importantly the difference between maintenance and prns and under what circumstances the prns are to be triggered using an action plan format that is not reflected in the computer generated alphabetically organized AVS.    Please see AVS for specific instructions unique to this visit that I personally wrote and verbalized to the the pt in detail and then reviewed with pt  by my nurse highlighting any  changes in therapy recommended at today's visit to their plan of care.

## 2016-08-11 ENCOUNTER — Encounter: Payer: Self-pay | Admitting: Hematology and Oncology

## 2016-08-11 ENCOUNTER — Ambulatory Visit (HOSPITAL_BASED_OUTPATIENT_CLINIC_OR_DEPARTMENT_OTHER): Payer: Managed Care, Other (non HMO) | Admitting: Hematology and Oncology

## 2016-08-11 DIAGNOSIS — N951 Menopausal and female climacteric states: Secondary | ICD-10-CM | POA: Diagnosis not present

## 2016-08-11 DIAGNOSIS — C50212 Malignant neoplasm of upper-inner quadrant of left female breast: Secondary | ICD-10-CM

## 2016-08-11 DIAGNOSIS — Z170 Estrogen receptor positive status [ER+]: Secondary | ICD-10-CM

## 2016-08-11 NOTE — Progress Notes (Signed)
Patient Care Team: [facility] as PCP - General (Family Medicine) [name], MD as Referring Physician (Emergency Medicine)  DIAGNOSIS:  Encounter Diagnosis  Name Primary?  . Malignant neoplasm of upper-inner quadrant of left breast in female, estrogen receptor positive ([place])     SUMMARY OF ONCOLOGIC HISTORY:   Breast cancer of upper-inner quadrant of left female breast ([place])   06/08/2012 Initial Diagnosis    invasive ductal carcinoma that was ER positive PR positive HER-2/neu positive measuring 3.1 cm by MRI criteria. Ki-67 was 70% HER-2 was amplified with a ratio 2.91      07/12/2012 - 07/17/2013 Neo-Adjuvant Chemotherapy    TCH 6 followed by Herceptin maintenance      12/11/2012 Surgery    Left breast lumpectomy: 1.8 cm tumor 1 positive sentinel node, axillary lymph node dissection 02/08/2013 showed 0/13 lymph nodes      03/25/2013 - 05/06/2013 Radiation Therapy    Adjuvant radiation therapy      06/05/2013 -  Anti-estrogen oral therapy    Tamoxifen 20 mg daily       CHIEF COMPLIANT: Follow-up on tamoxifen therapy  INTERVAL HISTORY: Melissa [name] is a 51 year old lady with above-mentioned history of left breast cancer treated with neoadjuvant chemotherapy followed by lumpectomy and radiation. She is currently on adjuvant tamoxifen since September 2014. She is tolerating it fairly well. She does have occasional hot flashes myalgias. She stays very busy with work. She denies any lumps or nodules in breast.  REVIEW OF SYSTEMS:   Constitutional: Denies fevers, chills or abnormal weight loss Eyes: Denies blurriness of vision Ears, nose, mouth, throat, and face: Denies mucositis or sore throat Respiratory: Denies cough, dyspnea or wheezes Cardiovascular: Denies palpitation, chest discomfort Gastrointestinal:  Denies nausea, heartburn or change in bowel habits Skin: Denies abnormal skin rashes Lymphatics: Denies new lymphadenopathy or easy  bruising Neurological:Denies numbness, tingling or new weaknesses Behavioral/Psych: Mood is stable, no new changes  Extremities: No lower extremity edema Breast:  denies any pain or lumps or nodules in either breasts All other systems were reviewed with the patient and are negative.  I have reviewed the past medical history, past surgical history, social history and family history with the patient and they are unchanged from previous note.  ALLERGIES:  is allergic to aspirin and iodinated diagnostic agents.  MEDICATIONS:  Current Outpatient Prescriptions  Medication Sig Dispense Refill  . albuterol (VENTOLIN HFA) 108 (90 BASE) MCG/ACT inhaler Inhale 2 puffs into the lungs every 6 (six) hours as needed. 1 Inhaler 3  . budesonide-formoterol (SYMBICORT) 80-4.5 MCG/ACT inhaler Inhale 2 puffs into the lungs 2 (two) times daily. 1 Inhaler 11  . DULERA 100-5 MCG/ACT AERO Inhale 2 puffs into the lungs every 12 (twelve) hours.  11  . famotidine (PEPCID) 20 MG tablet One at bedtime 30 tablet 2  . naproxen sodium (ANAPROX) 220 MG tablet Take 220 mg by mouth 3 (three) times daily as needed (for pain).     . pantoprazole (PROTONIX) 40 MG tablet Take 1 tablet (40 mg total) by mouth daily. Take 30-60 min before first meal of the day 30 tablet 2  . Respiratory Therapy Supplies (FLUTTER) DEVI Use as directed 1 each 0  . tamoxifen (NOLVADEX) 20 MG tablet TAKE 1 TABLET(20 MG) BY MOUTH DAILY 30 tablet 9   No current facility-administered medications for this visit.     PHYSICAL EXAMINATION: ECOG PERFORMANCE STATUS: 1 - Symptomatic but completely ambulatory  Vitals:   08/11/16 1002  BP: 99/61  Pulse: 79  Resp: 17  Temp: 97.8 F (36.6 C)   Filed Weights   08/11/16 1002  Weight: 116 lb 9.6 oz (52.9 kg)    GENERAL:alert, no distress and comfortable SKIN: skin color, texture, turgor are normal, no rashes or significant lesions EYES: normal, Conjunctiva are pink and non-injected, sclera  clear OROPHARYNX:no exudate, no erythema and lips, buccal mucosa, and tongue normal  NECK: supple, thyroid normal size, non-tender, without nodularity LYMPH:  no palpable lymphadenopathy in the cervical, axillary or inguinal LUNGS: clear to auscultation and percussion with normal breathing effort HEART: regular rate & rhythm and no murmurs and no lower extremity edema ABDOMEN:abdomen soft, non-tender and normal bowel sounds MUSCULOSKELETAL:no cyanosis of digits and no clubbing  NEURO: alert & oriented x 3 with fluent speech, no focal motor/sensory deficits EXTREMITIES: No lower extremity edema BREAST: No palpable masses or nodules in either right or left breasts. No palpable axillary supraclavicular or infraclavicular adenopathy no breast tenderness or nipple discharge. (exam performed in the presence of a chaperone)  LABORATORY DATA:  I have reviewed the data as listed   Chemistry      Component Value Date/Time   NA 133 (L) 07/18/2016 1532   NA 142 08/12/2014 0956   K 3.4 (L) 07/18/2016 1532   K 4.6 08/12/2014 0956   CL 98 (L) 07/18/2016 1532   CL 100 12/20/2012 1509   CO2 28 07/18/2016 1532   CO2 26 08/12/2014 0956   BUN 11 07/18/2016 1532   BUN 10.1 08/12/2014 0956   CREATININE 0.94 07/18/2016 1532   CREATININE 0.9 08/12/2014 0956      Component Value Date/Time   CALCIUM 8.8 (L) 07/18/2016 1532   CALCIUM 9.3 08/12/2014 0956   ALKPHOS 46 07/18/2016 1532   ALKPHOS 68 08/12/2014 0956   AST 15 07/18/2016 1532   AST 15 08/12/2014 0956   ALT 11 (L) 07/18/2016 1532   ALT 11 08/12/2014 0956   BILITOT 0.3 07/18/2016 1532   BILITOT 0.25 08/12/2014 0956       Lab Results  Component Value Date   WBC 7.4 07/18/2016   HGB 13.5 07/18/2016   HCT 39.0 07/18/2016   MCV 94.9 07/18/2016   PLT 203 07/18/2016   NEUTROABS 2.9 08/12/2014    ASSESSMENT & PLAN:  Breast cancer of upper-inner quadrant of left female breast Left breast invasive ductal carcinoma ER/PR positive HER-2  positive initially 3.1 cm, Ki-67 70%, HER-2 amplified ratio 2.91 status post neoadjuvant chemotherapy followed by surgery which showed 1.8 cm tumor 1 positive sentinel lymph node T1cN1 M0 stage IB status post radiation therapy and Herceptin maintenance currently on tamoxifen started 06/05/2013  Tamoxifen toxicities: No major side effects 1. occasional hot flashes 2. muscle aches and pains which are being well managed with over-the-counter Tylenol pain medication I discussed with her that I might leave her on antiestrogen therapy for up to 7 years.  Breast Cancer Surveillance: 1. Breast exam 08/11/16: Normal 2. Mammogram 05/31/16: No mammographic evidence of malignancy in either breast, breast density category C  Patient is a Secondary school teacher for [employer]. She stays very busy. Her mother is a patient of mine [facility]. Return to clinic in 1 year for follow up.   I spent 25 minutes talking to the patient of which more than half was spent in counseling and coordination of care.  No orders of the defined types were placed in this encounter.  The patient has a good understanding of the overall  plan. she agrees with it. she will call with any problems that may develop before the next visit here.   Rulon Eisenmenger, MD 08/11/16

## 2016-10-06 ENCOUNTER — Other Ambulatory Visit: Payer: Self-pay | Admitting: Hematology and Oncology

## 2016-10-06 NOTE — Telephone Encounter (Signed)
Chart reviewed.

## 2016-11-07 ENCOUNTER — Ambulatory Visit: Payer: Managed Care, Other (non HMO) | Admitting: Internal Medicine

## 2016-11-08 ENCOUNTER — Other Ambulatory Visit: Payer: Self-pay | Admitting: Hematology and Oncology

## 2016-11-25 ENCOUNTER — Ambulatory Visit (INDEPENDENT_AMBULATORY_CARE_PROVIDER_SITE_OTHER): Payer: Managed Care, Other (non HMO) | Admitting: Internal Medicine

## 2016-11-25 ENCOUNTER — Encounter: Payer: Self-pay | Admitting: Internal Medicine

## 2016-11-25 ENCOUNTER — Ambulatory Visit (INDEPENDENT_AMBULATORY_CARE_PROVIDER_SITE_OTHER)
Admission: RE | Admit: 2016-11-25 | Discharge: 2016-11-25 | Disposition: A | Discharge status: Home / Self Care | Payer: Managed Care, Other (non HMO) | Source: Ambulatory Visit | Attending: Internal Medicine | Admitting: Internal Medicine

## 2016-11-25 ENCOUNTER — Telehealth: Payer: Self-pay | Admitting: Internal Medicine

## 2016-11-25 VITALS — BP 114/80 | HR 81 | Ht 62.0 in | Wt 114.0 lb

## 2016-11-25 DIAGNOSIS — J479 Bronchiectasis, uncomplicated: Secondary | ICD-10-CM

## 2016-11-25 DIAGNOSIS — F17210 Nicotine dependence, cigarettes, uncomplicated: Secondary | ICD-10-CM | POA: Diagnosis not present

## 2016-11-25 DIAGNOSIS — J189 Pneumonia, unspecified organism: Secondary | ICD-10-CM

## 2016-11-25 NOTE — Progress Notes (Signed)
Subjective:     Patient ID: Melissa [name], female   DOB: [birth year] .   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 and 3 years of antifungal rx with fluconazole (for 2 years p latter) with residual sob heavy doe and sev times a year pain in L shoulder and lower back up to couple times a year lasts up  to a couple  of weeks maybe better with abx, maybe some worse joint pains,  Sometimes some sweats, occ with nasty mucus but no blood referred to pulmonary clinic for recurrent cp 01/2011 with documented Bronchiectasis in 2012    History of Present Illness  01/26/2011 Initial pulmonary office eval cc recurrent pleuritic L ant chest  pain, more severe than usual episodes,  rad neck,  Onset x 2 weeks with abrupt onset,  no better on lodine> stopped.   No sweats or nasty mucus now. Some better at day of ov p rx = dex and zithromax but  some sorethroat since dex with overt hb. rec ct chest POS BRONCHIECTASIS    06/13/2016 acute extended ov/Osmin Welz re: bronchiectasis/ recurrent cough/ smoker   Chief Complaint  Patient presents with  . Acute Visit    Pt. c/o of coughing with yellowish mucus, Pt. has been wheezing, Has needed to use her recue inhaler mainly at night, Pt. denies chest pain,fever  worse since early October 2017 on dulera 100 2 each am only / still smoking and assoc nasal congestion/discolored drainage rec Augmentin 875 mg take one pill twice daily  X 10 days - take at breakfast and supper with large glass of water.  It would help reduce the usual side effects (diarrhea and yeast infections) if you ate cultured yogurt at lunch.  If breathing not better > Prednisone 10 mg take  4 each am x 2 days,   2 each am x 2 days,  1 each am x 2 days and stop  Call Melissa [name] at [phone] to set up sinus CT if still coughing    stop smoking completely before smoking completely stops you!  Plan A = Automatic = Dulera 100 Take 2  puffs first thing in am and then another 2 puffs about 12 hours later.  Plan B = Backup Only use your albuterol as a rescue medication     07/15/2016 acute extended ov/Melissa [name] re: ? Bronchiectasis /dulera 100 2 bid maint / still smoking Chief Complaint  Patient presents with  . Acute Visit    lower right back pain x 1 wk- radiating to her right rib area over the past 2 days. She feels discomfort when she takes a deep breath.  She also c/o episodes of feeling light headed. She is not having any SOB and no more cough than usual.    acute onset one week prior to OV  Very Similar pattern/characteristics to prev flares of bronchiectasis related cp on L side but this CP is located R post rad anteriorly / worse with breathing/ worse supine/ moderately severe No sob or need for rescue saba / some better with tylenol  rec levaquin 750 mg one daily x 5 days  For cough / congestion > mucinex dm up to 1200 every 12 hours as needed  Plan A = Automatic = Symbicort 80 1-2 every 12 hours  Work on maintaining perfect  inhaler technique:  Plan B = Backup Only use your albuterol as a rescue  medication CTa  07/19/16 neg PE or explanation for cp c/w mscp from cough    08/09/2016  f/u ov/Melissa [name] re: bronchiectasis / dulera 100 2bid/ plus feels need saba when overdoes ex/ still smoking Chief Complaint  Patient presents with  . Follow-up    PFT's done today. She continues to have right side back and rib pain.  Her breathing has improved some. She is down to 3 cigs per day.    does ok sleeping /no excess am mucus mostly clear  Cp is still present but much better with nsaids and does not req narcs  rec Pantoprazole (protonix) 40 mg   Take  30-60 min before first meal of the day and Pepcid (famotidine)  20 mg one @  bedtime until return to office whenever flare of cough until back to normal for at least a week  GERD diet   For cough > mucinex dm 1200 mg every 12 hours and use flutter valve as much as possible The key is  to stop smoking completely before smoking completely stops you!    11/25/2016  f/u ov/Melissa [name] re: f/u apparent pna LLL in pt with known bronchiectasis/ cystic lesion LLL  Chief Complaint  Patient presents with  . Follow-up    Pt states dxed with PNA x 3 wks ago.  She states she was told a few days ago that her CXR showed a lung mass and told her that she needed a CT Chest. She states having right side pain and minimal SOB.    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 No unusual exp hx or h/o childhood pna/ asthma or knowledge of premature birth.  Sleeping ok without nocturnal  or early am exacerbation  of respiratory  c/o's or need for noct saba. Also denies any obvious fluctuation of symptoms with weather or environmental changes or other aggravating or alleviating factors except as outlined above   Current Medications, Allergies, Complete Past Medical History, Past Surgical History, Family History, and Social History were reviewed in [employer] record.  ROS  The following are not active complaints unless bolded sore throat, dysphagia, dental problems, itching, sneezing,  nasal congestion or excess/ purulent secretions, ear ache,   fever, chills, sweats, unintended wt loss, classically pleuritic or exertional cp,  orthopnea pnd or leg swelling, presyncope, palpitations, abdominal pain, anorexia, nausea, vomiting, diarrhea  or change in bowel or bladder habits, change  in stools or urine, dysuria,hematuria,  rash, arthralgias, visual complaints, headache, numbness, weakness or ataxia or problems with walking or coordination,  change in mood/affect or memory.                          Objective:   Physical Exam   pleasant amb wf nad/ vital signs reviewed -    - Note on arrival 02 sats  95% on RA  Wt 118  02/10/11 >  05/03/2011  118 > 117 03/23/2012 >  122 07/30/2012 > 09/29/2014 121> 06/13/2016   119 > 07/15/2016 118 > 08/09/2016  118  > 11/25/2016   114   HEENT: upper dentures/, turbinates, and oropharynx. Nl external ear canals without cough reflex   NECK :  without JVD/Nodes/TM/ nl carotid upstrokes bilaterally   LUNGS: no acc muscle use,   Minimal insp and exp scattered rhonchi bilaterally, no localized findings or egophony   CV:  RRR  no s3 or murmur or increase in P2, no edema   ABD:  soft and nontender with nl excursion in the supine position. No bruits or organomegaly, bowel sounds nl  MS:  warm without deformities, calf tenderness, cyanosis or clubbing     CXR PA and Lateral:   11/25/2016 :    I personally reviewed images and agree with radiology impression as follows:   which was present on prior exam. Stable cavitary lesion is seen in left lung base with increased solid component; repeat CT scan of the chest is recommended to rule out potential neoplasm.  My review:  Much less density/ size in LLL infiltrate than cxr outside film November 05 2016    Assessment:

## 2016-11-25 NOTE — Patient Instructions (Addendum)
Bronchiectasis =   you have scarring of your bronchial tubes which means that they don't function perfectly normally and mucus tends to pool in certain areas of your lung which can cause pneumonia and further scarring of your lung and bronchial tubes   Whenever you develop cough congestion take mucinex or mucinex dm > these will help keep the mucus loose and flowing but if your condition worsens you need to seek help immediately preferably here or somewhere inside the [facility] system to compare xrays ( worse = darker or bloody mucus or pain on breathing in)     Please remember to go to the   x-ray department downstairs in the basement  for your tests - we will call you with the results when they are available.  The key is to stop smoking completely before smoking completely stops you!   Please schedule a follow up visit in 3 months but call sooner if needed

## 2016-11-25 NOTE — Progress Notes (Signed)
lmtcb

## 2016-11-25 NOTE — Telephone Encounter (Signed)
See result note.  

## 2016-11-25 NOTE — Telephone Encounter (Signed)
MW  I got a call report from Opal Sidles from [facility] in regards to pt's chest xray. Please advise if you need [name] to inform the pt.   Study Result   CLINICAL DATA:  Cough, shortness of breath.  EXAM: CHEST  2 VIEW  COMPARISON:  Radiographs of July 15, 2016. CT scan of July 19, 2016.  FINDINGS: The heart size and mediastinal contours are within normal limits. No pneumothorax or pleural effusion is noted. Right lung is clear. Stable left apical scarring is noted. Surgical clips are noted in the left axillary region. Left perihilar nodular density is noted which was present on prior exam. Stable cavitary lesion is seen in left lung base with increased solid component compared to prior exam. The visualized skeletal structures are unremarkable.  IMPRESSION: Stable left apical scarring. Left perihilar nodular density is noted which was present on prior exam. Stable cavitary lesion is seen in left lung base with increased solid component; repeat CT scan of the chest is recommended to rule out potential neoplasm. These results will be called to the ordering clinician or representative by the Radiologist Assistant, and communication documented in the PACS or zVision Dashboard.

## 2016-11-26 DIAGNOSIS — J189 Pneumonia, unspecified organism: Secondary | ICD-10-CM | POA: Insufficient documentation

## 2016-11-26 NOTE — Assessment & Plan Note (Addendum)
New density November 05 2016 where prev there was a cystic lesion > rx levaquin with marked improvement cxr 11/25/2016  > f/u cxr in 6 weeks rec   Most likely the cystic lesion assoc with bronchiectasis was superinfected and rx was appropriate but only a few weeks into the flare her cxr has not cleared completely back to baseline   All acute symptoms have resolved and assured her that's more important than the cxr at this point which is prone to lag and no further abx needed  I had an extended discussion with the patient reviewing all relevant studies/ imaging including from outside disc and comparing to canopy versions of previous and present ct/cxrs)  completed to date and  lasting 25 minutes of a 40  minute office visit addressing new non-specific but potentially very serious refractory respiratory symptoms of uncertain and potentially multiple  etiologies.   Each maintenance medication was reviewed in detail including most importantly the difference between maintenance and prns and under what circumstances the prns are to be triggered using an action plan format that is not reflected in the computer generated alphabetically organized AVS.    Please see AVS for specific instructions unique to this office visit that I personally wrote and verbalized to the the pt in detail and then reviewed with pt  by my nurse highlighting any changes in therapy/plan of care  recommended at today's visit.

## 2016-11-26 NOTE — Assessment & Plan Note (Signed)
-   CT chest   01/26/2011 Status post left upper lobectomy.  Focal bronchiectasis / scarring medially in the left lung apex with  surrounding inflammatory changes  - Pneumovax 08/12/11  And prevnar 08/09/2016  - 07/15/2016  After extensive coaching HFA effectiveness =    75% symbicort 80 2bid  - PFT's  08/09/2016  FEV1 2.05 (77 % ) ratio 70  p 5 % improvement from saba p dulera 100 x2  prior to study with DLCO  46 % corrects to 55  % for alv volume    Relatively well compensated until acute flare c/w HCAP, responsive to levaquin so no change in rx needed  = flutter, mucinex, symb 80 2bid

## 2016-11-26 NOTE — Assessment & Plan Note (Signed)
>   3 min  I took an extended  opportunity with this patient to outline the consequences of continued cigarette use  in airway disorders based on all the data we have from the multiple national lung health studies (perfomed over decades at millions of dollars in cost)  indicating that smoking cessation, not choice of inhalers or physicians, is the most important aspect of care.  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

## 2016-11-28 ENCOUNTER — Telehealth: Payer: Self-pay | Admitting: Internal Medicine

## 2016-11-28 NOTE — Progress Notes (Signed)
Spoke with pt and notified of results per Dr. Wert. Pt verbalized understanding and denied any questions. 

## 2016-11-28 NOTE — Telephone Encounter (Signed)
Pt was returning call to [place] re: cxr results, but these have been already been relayed to pt earlier this morning. Will close encounter.

## 2017-01-13 ENCOUNTER — Ambulatory Visit: Payer: Managed Care, Other (non HMO) | Admitting: Internal Medicine

## 2017-02-27 ENCOUNTER — Encounter: Payer: Self-pay | Admitting: Internal Medicine

## 2017-02-27 ENCOUNTER — Ambulatory Visit (INDEPENDENT_AMBULATORY_CARE_PROVIDER_SITE_OTHER)
Admission: RE | Admit: 2017-02-27 | Discharge: 2017-02-27 | Disposition: A | Discharge status: Home / Self Care | Payer: Managed Care, Other (non HMO) | Source: Ambulatory Visit | Attending: Internal Medicine | Admitting: Internal Medicine

## 2017-02-27 ENCOUNTER — Ambulatory Visit (INDEPENDENT_AMBULATORY_CARE_PROVIDER_SITE_OTHER): Payer: Managed Care, Other (non HMO) | Admitting: Internal Medicine

## 2017-02-27 VITALS — BP 104/60 | HR 85 | Ht 62.0 in | Wt 116.0 lb

## 2017-02-27 DIAGNOSIS — J479 Bronchiectasis, uncomplicated: Secondary | ICD-10-CM

## 2017-02-27 DIAGNOSIS — F17210 Nicotine dependence, cigarettes, uncomplicated: Secondary | ICD-10-CM | POA: Diagnosis not present

## 2017-02-27 NOTE — Patient Instructions (Signed)
The key is to stop smoking completely before smoking completely stops you!    Please schedule a follow up visit in 3 months but call sooner if needed with cxr on return  

## 2017-02-27 NOTE — Progress Notes (Signed)
Subjective:     Patient ID: Melissa [name], female   DOB: [birth year] .   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 and 3 years of antifungal rx with fluconazole (for 2 years p latter) with residual sob heavy doe and sev times a year pain in L shoulder and lower back up to couple times a year lasts up  to a couple  of weeks maybe better with abx, maybe some worse joint pains,  Sometimes some sweats, occ with nasty mucus but no blood referred to pulmonary clinic for recurrent cp 01/2011 with documented Bronchiectasis in 2012    History of Present Illness  01/26/2011 Initial pulmonary office eval cc recurrent pleuritic L ant chest  pain, more severe than usual episodes,  rad neck,  Onset x 2 weeks with abrupt onset,  no better on lodine> stopped.   No sweats or nasty mucus now. Some better at day of ov p rx = dex and zithromax but  some sorethroat since dex with overt hb. rec ct chest POS BRONCHIECTASIS     07/15/2016 acute extended ov/Illona Bulman re: ? Bronchiectasis /dulera 100 2 bid maint / still smoking Chief Complaint  Patient presents with  . Acute Visit    lower right back pain x 1 wk- radiating to her right rib area over the past 2 days. She feels discomfort when she takes a deep breath.  She also c/o episodes of feeling light headed. She is not having any SOB and no more cough than usual.    acute onset one week prior to OV  Very Similar pattern/characteristics to prev flares of bronchiectasis related cp on L side but this CP is located R post rad anteriorly / worse with breathing/ worse supine/ moderately severe No sob or need for rescue saba / some better with tylenol  rec levaquin 750 mg one daily x 5 days  For cough / congestion > mucinex dm up to 1200 every 12 hours as needed  Plan A = Automatic = Symbicort 80 1-2 every 12 hours  Work on maintaining perfect  inhaler technique:  Plan B = Backup Only use your  albuterol as a rescue medication CTa  07/19/16 neg PE or explanation for R cp c/w mscp from cough     11/25/2016  f/u ov/Claxton Levitz re: f/u apparent pna LLL in pt with known bronchiectasis/ cystic lesion LLL  Chief Complaint  Patient presents with  . Follow-up    Pt states dxed with PNA x 3 wks ago.  She states she was told a few days ago that her CXR showed a lung mass and told her that she needed a CT Chest. She states having right side pain and minimal SOB.    cough and cp improved from last ov/ no excess/ purulent sputum or mucus plugs then mid April 2018 abrupt onset  nasal stuffiness and more chest congestion more yellowish then green/chills >  seen in [place]  With dx "L sided pna" rx x levaquin x 10 days > green mucus gone /chills gone >  Back to baseline level of chronic mscp R > L worse  Only with cough and back to nl ex tol   rec The key is to stop smoking completely before smoking completely stops you!      02/27/2017  f/u ov/Daisa Stennis re:  Bronchiectasis with nl pfts on sybm 80 2bid / still smoking  Chief  Complaint  Patient presents with  . Follow-up    Breathing is doing well. No new co's today.   cps all gone and Not limited by breathing from desired activities   No need for saba   No obvious day to day or daytime variability or assoc excess/ purulent sputum or mucus plugs or hemoptysis  or chest tightness, subjective wheeze or overt sinus or hb symptoms. No unusual exp hx or h/o childhood pna/ asthma or knowledge of premature birth.  Sleeping ok without nocturnal  or early am exacerbation  of respiratory  c/o's or need for noct saba. Also denies any obvious fluctuation of symptoms with weather or environmental changes or other aggravating or alleviating factors except as outlined above   Current Medications, Allergies, Complete Past Medical History, Past Surgical History, Family History, and Social History were reviewed in [employer] record.  ROS  The  following are not active complaints unless bolded sore throat, dysphagia, dental problems, itching, sneezing,  nasal congestion or excess/ purulent secretions, ear ache,   fever, chills, sweats, unintended wt loss, classically pleuritic or exertional cp,  orthopnea pnd or leg swelling, presyncope, palpitations, abdominal pain, anorexia, nausea, vomiting, diarrhea  or change in bowel or bladder habits, change in stools or urine, dysuria,hematuria,  rash, arthralgias, visual complaints, headache, numbness, weakness or ataxia or problems with walking or coordination,  change in mood/affect or memory.              Objective:   Physical Exam   pleasant amb wf nad/ vital signs reviewed -    - Note on arrival 02 sats  96% on RA    Wt 118  02/10/11 >  05/03/2011  118 > 117 03/23/2012 >  122 07/30/2012 > 09/29/2014 121> 06/13/2016   119 > 07/15/2016 118 > 08/09/2016  118  > 11/25/2016   114 > 02/27/2017   116   HEENT: upper dentures/, turbinates, and oropharynx. Nl external ear canals without cough reflex   NECK :  without JVD/Nodes/TM/ nl carotid upstrokes bilaterally   LUNGS: no acc muscle use,   Very minimal insp and exp scattered rhonchi bilaterally, perhaps slt worse L than r and ant vs post  CV:  RRR  no s3 or murmur or increase in P2, no edema   ABD:  soft and nontender with nl excursion in the supine position. No bruits or organomegaly, bowel sounds nl  MS:  warm without deformities, calf tenderness, cyanosis or clubbing     CXR PA and Lateral:   02/27/2017 :    I personally reviewed images and agree with radiology impression as follows:   Marland Kitchen The small cavitary lesion at the left lung base is again seen with less surrounding linear opacities most likely postinflammatory or infectious in origin. 2. The vague opacity just above the left hilum in the left mid lung is stable and also appears to be due to a previously demonstrated cavitary lesion by CT. No new or enlarging abnormality is seen.      Assessment:

## 2017-02-28 NOTE — Progress Notes (Signed)
Called and left her a detailed msg with results

## 2017-03-01 NOTE — Assessment & Plan Note (Signed)
-   CT chest   01/26/2011 Status post left upper lobectomy.  Focal bronchiectasis / scarring medially in the left lung apex with  surrounding inflammatory changes  - Pneumovax 08/12/11  And prevnar 08/09/2016  - 07/15/2016  After extensive coaching HFA effectiveness =    75% symbicort 80 2bid  - PFT's  08/09/2016  FEV1 2.05 (77 % ) ratio 70  p 5 % improvement from saba p dulera 100 x2  prior to study with DLCO  46 % corrects to 55  % for alv volume   Despite active smoking against medical advice remains well compensated on symb 80 2bid with min need for saba so no changes needed

## 2017-03-01 NOTE — Assessment & Plan Note (Signed)
>   3 min Discussed the risks and costs (both direct and indirect)  of smoking relative to the benefits of quitting but patient unwilling to commit at this point to a specific quit date.    Although I don't endorse regular use of e cigs/ many pts find them helpful; however, I emphasized they should be considered a "one-way bridge" off all tobacco products.  

## 2017-05-18 ENCOUNTER — Telehealth: Payer: Self-pay

## 2017-05-18 NOTE — Telephone Encounter (Signed)
Returned pt VM, she is in need of a letter regarding she is still a patient in our office and faxed to her work. Informed her I would get that done for her.  Cyndia Bent RN

## 2017-05-30 ENCOUNTER — Ambulatory Visit: Payer: Managed Care, Other (non HMO) | Admitting: Internal Medicine

## 2017-06-12 ENCOUNTER — Telehealth: Payer: Self-pay | Admitting: Internal Medicine

## 2017-06-12 DIAGNOSIS — C50919 Malignant neoplasm of unspecified site of unspecified female breast: Secondary | ICD-10-CM | POA: Insufficient documentation

## 2017-06-12 NOTE — Telephone Encounter (Signed)
Spoke with pt.  Pt c/o sinus headaches for past 2 weeks.  Denies fever, congestion or drainage.  PT given appt with TP 06/13/17 4:15.  Nothing further needed.

## 2017-06-13 ENCOUNTER — Ambulatory Visit: Payer: Managed Care, Other (non HMO) | Admitting: Adult Health

## 2017-06-16 ENCOUNTER — Encounter: Payer: Self-pay | Admitting: Internal Medicine

## 2017-06-16 ENCOUNTER — Ambulatory Visit (INDEPENDENT_AMBULATORY_CARE_PROVIDER_SITE_OTHER)
Admission: RE | Admit: 2017-06-16 | Discharge: 2017-06-16 | Disposition: A | Discharge status: Home / Self Care | Payer: 59 | Source: Ambulatory Visit | Attending: Internal Medicine | Admitting: Internal Medicine

## 2017-06-16 ENCOUNTER — Ambulatory Visit: Payer: 59 | Admitting: Internal Medicine

## 2017-06-16 VITALS — BP 110/62 | HR 103 | Ht 62.0 in | Wt 119.0 lb

## 2017-06-16 DIAGNOSIS — J479 Bronchiectasis, uncomplicated: Secondary | ICD-10-CM | POA: Diagnosis not present

## 2017-06-16 DIAGNOSIS — F17210 Nicotine dependence, cigarettes, uncomplicated: Secondary | ICD-10-CM | POA: Diagnosis not present

## 2017-06-16 DIAGNOSIS — J189 Pneumonia, unspecified organism: Secondary | ICD-10-CM

## 2017-06-16 NOTE — Progress Notes (Signed)
Subjective:     Patient ID: Melissa [name], female   DOB: Sep 09, 1965 .   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 and 3 years of antifungal rx with fluconazole with residual sob heavy doe and sev times a year pain in L shoulder and lower back up to couple times a year lasts up  to a couple  of weeks maybe better with abx, maybe some worse joint pains,  Sometimes some sweats, occ with nasty mucus but no blood referred to pulmonary clinic for recurrent cp 01/2011 with documented Bronchiectasis in 2012    History of Present Illness  01/26/2011 Initial pulmonary office eval cc recurrent pleuritic L ant chest  pain, more severe than usual episodes,  rad neck,  Onset x 2 weeks with abrupt onset,  no better on lodine> stopped.   No sweats or nasty mucus now. Some better at day of ov p rx = dex and zithromax but  some sorethroat since dex with overt hb. rec ct chest POS BRONCHIECTASIS     07/15/2016 acute extended ov/Tiernan Suto re: ? Bronchiectasis /dulera 100 2 bid maint / still smoking Chief Complaint  Patient presents with  . Acute Visit    lower right back pain x 1 wk- radiating to her right rib area over the past 2 days. She feels discomfort when she takes a deep breath.  She also c/o episodes of feeling light headed. She is not having any SOB and no more cough than usual.    acute onset one week prior to OV  Very Similar pattern/characteristics to prev flares of bronchiectasis related cp on L side but this CP is located R post rad anteriorly / worse with breathing/ worse supine/ moderately severe No sob or need for rescue saba / some better with tylenol  rec levaquin 750 mg one daily x 5 days  For cough / congestion > mucinex dm up to 1200 every 12 hours as needed  Plan A = Automatic = Symbicort 80 1-2 every 12 hours  Work on maintaining perfect  inhaler technique:  Plan B = Backup Only use your albuterol as a rescue  medication CTa  07/19/16 neg PE or explanation for R cp c/w mscp from cough     11/25/2016  f/u ov/Aryon Nham re: f/u apparent pna LLL in pt with known bronchiectasis/ cystic lesion LLL  Chief Complaint  Patient presents with  . Follow-up    Pt states dxed with PNA x 3 wks ago.  She states she was told a few days ago that her CXR showed a lung mass and told her that she needed a CT Chest. She states having right side pain and minimal SOB.    cough and cp improved from last ov/ no excess/ purulent sputum or mucus plugs then mid April 2018 abrupt onset  nasal stuffiness and more chest congestion more yellowish then green/chills >  seen in [place]  With dx "L sided pna" rx x levaquin x 10 days > green mucus gone /chills gone >  Back to baseline level of chronic mscp R > L worse  Only with cough and back to nl ex tol   rec The key is to stop smoking completely before smoking completely stops you!      02/27/2017  f/u ov/Tannar Broker re:  Bronchiectasis with nl pfts on sybm 80 2bid / still smoking  Chief Complaint  Patient presents with  .  Follow-up    Breathing is doing well. No new co's today.   cps all gone and Not limited by breathing from desired activities   No need for saba  rec Stop smoking    06/16/2017  f/u ov/Antwione Picotte re:  Bronchiectasis s/p valley fever/ symb 80 2bid  Chief Complaint  Patient presents with  . Follow-up    Breathing is overall doing well. She states she has been having some HA and was dxed with sinus infection a few days ago- taking levaquin.    pain frontal and bilateral and worse in am and better on levquin/medrol   Not limited by breathing from desired activities    No obvious day to day or daytime variability or assoc ongoing excess/ purulent sputum or mucus plugs or hemoptysis or cp or chest tightness, subjective wheeze or overt sinus or hb symptoms. No unusual exposure hx or h/o childhood pna/ asthma or knowledge of premature birth.  Sleeping ok flat without nocturnal   or early am exacerbation  of respiratory  c/o's or need for noct saba. Also denies any obvious fluctuation of symptoms with weather or environmental changes or other aggravating or alleviating factors except as outlined above   Current Allergies, Complete Past Medical History, Past Surgical History, Family History, and Social History were reviewed in [employer] record.  ROS  The following are not active complaints unless bolded Hoarseness, sore throat, dysphagia, dental problems, itching, sneezing,  nasal congestion or discharge of excess mucus or purulent secretions, ear ache,   fever, chills, sweats, unintended wt loss or wt gain, classically pleuritic or exertional cp,  orthopnea pnd or leg swelling, presyncope, palpitations, abdominal pain, anorexia, nausea, vomiting, diarrhea  or change in bowel habits or change in bladder habits, change in stools or change in urine, dysuria, hematuria,  rash, arthralgias, visual complaints, headache, numbness, weakness or ataxia or problems with walking or coordination,  change in mood/affect or memory.           Current Meds  Medication Sig  . albuterol (VENTOLIN HFA) 108 (90 BASE) MCG/ACT inhaler Inhale 2 puffs into the lungs every 6 (six) hours as needed.  . budesonide-formoterol (SYMBICORT) 80-4.5 MCG/ACT inhaler Inhale 2 puffs into the lungs 2 (two) times daily. (Patient taking differently: Inhale 2 puffs into the lungs daily. )  . famotidine (PEPCID) 20 MG tablet One at bedtime  . levofloxacin (LEVAQUIN) 500 MG tablet Take 1 tablet by mouth daily.  . methylPREDNISolone (MEDROL DOSEPAK) 4 MG TBPK tablet AS DIRECTED  . naproxen sodium (ANAPROX) 220 MG tablet Take 220 mg by mouth 3 (three) times daily as needed (for pain).   . pantoprazole (PROTONIX) 40 MG tablet Take 1 tablet (40 mg total) by mouth daily. Take 30-60 min before first meal of the day  . Respiratory Therapy Supplies (FLUTTER) DEVI Use as directed  . tamoxifen  (NOLVADEX) 20 MG tablet TAKE 1 TABLET BY MOUTH DAILY              Objective:   Physical Exam   amb wf nad   Wt 118  02/10/11 >  05/03/2011  118 > 117 03/23/2012 >  122 07/30/2012 > 09/29/2014 121> 06/13/2016   119 > 07/15/2016 118 > 08/09/2016  118  > 11/25/2016   114 > 02/27/2017   116 > 06/16/2017  119    Vital signs reviewed - Note on arrival 02 sats  97% on RA     Top dentures  LUNGS: no acc  muscle use,   Very minimal insp and exp scattered rhonchi bilaterally,few insp speaks also     HEENT: nl  turbinates bilaterally, and oropharynx. Nl external ear canals without cough reflex - top dentures    NECK :  without JVD/Nodes/TM/ nl carotid upstrokes bilaterally   LUNGS: no acc muscle use,  Nl contour chest  With min insp and exp rhonchi bilaterally, a few insp squeaks L > R   CV:  RRR  no s3 or murmur or increase in P2, and no edema   ABD:  soft and nontender with nl inspiratory excursion in the supine position. No bruits or organomegaly appreciated, bowel sounds nl  MS:  Nl gait/ ext warm without deformities, calf tenderness, cyanosis or clubbing No obvious joint restrictions   SKIN: warm and dry without lesions    NEURO:  alert, approp, nl sensorium with  no motor or cerebellar deficits apparent.       CXR PA and Lateral:   06/16/2017 :    I personally reviewed images and agree with radiology impression as follows:    No acute disease. No change in nodular opacity projecting just superior to the left hilum compatible with cavitary lesion seen on prior CT.     Assessment:

## 2017-06-16 NOTE — Patient Instructions (Signed)
Call if not better after you finish your antibiotics  Please remember to go to the  x-ray department downstairs in the basement  for your tests - we will call you with the results when they are available.      The key is to stop smoking completely before smoking completely stops you!    Please schedule a follow up visit in 6  months but call sooner if needed

## 2017-06-18 ENCOUNTER — Encounter: Payer: Self-pay | Admitting: Internal Medicine

## 2017-06-18 NOTE — Assessment & Plan Note (Signed)
-   CT chest   01/26/2011 Status post left upper lobectomy.  Focal bronchiectasis / scarring medially in the left lung apex with  surrounding inflammatory changes  - Pneumovax 08/12/11  And prevnar 08/09/2016  - 07/15/2016  After extensive coaching HFA effectiveness =    75% symbicort 80 2bid  - PFT's  08/09/2016  FEV1 2.05 (77 % ) ratio 70  p 5 % improvement from saba p dulera 100 x2  prior to study with DLCO  46 % corrects to 55  % for alv volume    Despite smoking and bronchiectasis,  No significant airflow obst while on adequate maint rx > no changes needed.   Each maintenance medication was reviewed in detail including most importantly the difference between maintenance and as needed and under what circumstances the prns are to be used.  Please see AVS for specific  Instructions which are unique to this visit and I personally typed out  which were reviewed in detail in writing with the patient and a copy provided.

## 2017-06-18 NOTE — Assessment & Plan Note (Signed)
>   3 min Discussed the risks and costs (both direct and indirect)  of smoking relative to the benefits of quitting but patient unwilling to commit at this point to a specific quit date.       

## 2017-06-18 NOTE — Assessment & Plan Note (Signed)
New density November 05 2016 where prev there was a cystic lesion > rx levaquin with marked improvement cxr 11/25/2016  > f/u cxr 06/16/2017 no def pna  > no additional w/u needed  Should return if not back to baseline p completes abx for sinus dz/ low threshold for sinus cT in this setting but no need for chest ct based on cxr review

## 2017-06-20 ENCOUNTER — Other Ambulatory Visit: Payer: Self-pay

## 2017-06-20 ENCOUNTER — Encounter (HOSPITAL_COMMUNITY): Payer: Self-pay | Admitting: *Deleted

## 2017-06-20 ENCOUNTER — Emergency Department (HOSPITAL_COMMUNITY)
Admission: EM | Admit: 2017-06-20 | Discharge: 2017-06-20 | Disposition: A | Discharge status: Home / Self Care | Payer: 59 | Attending: Emergency Medicine | Admitting: Emergency Medicine

## 2017-06-20 DIAGNOSIS — F17210 Nicotine dependence, cigarettes, uncomplicated: Secondary | ICD-10-CM | POA: Insufficient documentation

## 2017-06-20 DIAGNOSIS — Z79899 Other long term (current) drug therapy: Secondary | ICD-10-CM | POA: Diagnosis not present

## 2017-06-20 DIAGNOSIS — G43109 Migraine with aura, not intractable, without status migrainosus: Secondary | ICD-10-CM

## 2017-06-20 DIAGNOSIS — G43909 Migraine, unspecified, not intractable, without status migrainosus: Secondary | ICD-10-CM | POA: Diagnosis not present

## 2017-06-20 DIAGNOSIS — J45909 Unspecified asthma, uncomplicated: Secondary | ICD-10-CM | POA: Insufficient documentation

## 2017-06-20 MED ORDER — DEXAMETHASONE SODIUM PHOSPHATE 4 MG/ML IJ SOLN
10.0000 mg | Freq: Once | INTRAMUSCULAR | Status: AC
  Administered 2017-06-20: 10 mg via INTRAVENOUS
  Filled 2017-06-20: qty 3

## 2017-06-20 MED ORDER — DIPHENHYDRAMINE HCL 50 MG/ML IJ SOLN
50.0000 mg | Freq: Once | INTRAMUSCULAR | Status: AC
  Administered 2017-06-20: 50 mg via INTRAVENOUS
  Filled 2017-06-20: qty 1

## 2017-06-20 MED ORDER — METOCLOPRAMIDE HCL 5 MG/ML IJ SOLN
10.0000 mg | Freq: Once | INTRAMUSCULAR | Status: AC
  Administered 2017-06-20: 10 mg via INTRAVENOUS
  Filled 2017-06-20: qty 2

## 2017-06-20 MED ORDER — KETOROLAC TROMETHAMINE 30 MG/ML IJ SOLN
30.0000 mg | Freq: Once | INTRAMUSCULAR | Status: AC
  Administered 2017-06-20: 30 mg via INTRAVENOUS
  Filled 2017-06-20: qty 1

## 2017-06-20 MED ORDER — SODIUM CHLORIDE 0.9 % IV BOLUS (SEPSIS)
500.0000 mL | Freq: Once | INTRAVENOUS | Status: AC
  Administered 2017-06-20: 500 mL via INTRAVENOUS

## 2017-06-20 NOTE — Discharge Instructions (Signed)
You have been seen in the Emergency Department (ED) for a headache.  Please use Tylenol or Motrin as needed for symptoms, but only as written on the box.  °As we have discussed, please follow up with your primary care doctor as soon as possible regarding today?s Emergency Department (ED) visit and your headache symptoms.   ° °Call your doctor or return to the ED if you have a worsening headache, sudden and severe headache, confusion, slurred speech, facial droop, weakness or numbness in any arm or leg, extreme fatigue, vision problems, or other symptoms that concern you. ° °

## 2017-06-20 NOTE — ED Provider Notes (Signed)
Emergency Department Provider Note   I have reviewed the triage vital signs and the nursing notes.   HISTORY  Chief Complaint Migraine   HPI Melissa [name] is a 51 y.o. female with PMH of migraine HA, asthma, and h/o breast cancer migraine type headache with face pain for the past 9 days.  She was started on Levaquin during an urgent care visit on December 3.  Her face pain/sinus symptoms have not improved on antibiotics.  She describes her headache as diffuse with sensitivity to light and sound.  She feels pressure behind her eyes.  She notices some intermittent visual disturbances that seem like wavy line.  No sudden onset maximal intensity symptoms.  No fevers or chills.  No neck stiffness.  No unilateral weakness or numbness. No gait instability.    Past Medical History:  Diagnosis Date  . Arthritis    knees and hips  . Asthma   . Bronchiolitis   . Cancer [facility])    breast cancer 2014  . H/O coccidioidomycosis    was reason for lung lobectomy  . Headache(784.0)    due to eye strain or not eating  . History of anemia    no current problem  . History of asthma    as a child  . History of breast cancer 2014   left  . History of chemotherapy    finished 07/17/2013  . Hx of radiation therapy 03/25/13-05/06/13   left breast 5000 cGy/25 sessions, left breast boost 1000 cGy/5 sessions  . Runny nose 07/30/2013   clear drainage  . Wears dentures    upper    Patient Active Problem List   Diagnosis Date Noted  . HCAP (healthcare-associated pneumonia) 11/26/2016  . Upper airway cough syndrome 08/10/2016  . Abnormal echocardiogram 08/07/2013  . Chest tightness or pressure 08/07/2013  . Hx of radiation therapy   . Edema of left lower extremity 11/19/2012  . Tachycardia 09/20/2012  . Breast cancer of upper-inner quadrant of left female breast ([place]) 06/08/2012  . Obstructive bronchiectasis ([place]) 01/26/2011  . Cigarette smoker 01/26/2011    Past Surgical History:  Procedure  Laterality Date  . AXILLARY LYMPH NODE DISSECTION Left 02/08/2013   Procedure: LEFT AXILLARY DISSECTION;  Surgeon: Edward Jolly, MD;  Location: [place];  Service: General;  Laterality: Left;  . BREAST CYST EXCISION Right 2006  . BREAST LUMPECTOMY WITH NEEDLE LOCALIZATION AND AXILLARY SENTINEL LYMPH NODE BX Left 12/31/2012   Procedure: NEEDLE LOCALIZATION LEFT BREAST LUMPECTOMY AND LEFT AXILLARY SENTENIAL LYMPH NODE BX;  Surgeon: Edward Jolly, MD;  Location: [place];  Service: General;  Laterality: Left;  . CESAREAN SECTION  1995/1996  . LUNG LOBECTOMY Left 05/1996   upper lobe - due to [facility] Fever  . PORT-A-CATH REMOVAL Right 08/02/2013   Procedure: REMOVAL PORT-A-CATH;  Surgeon: Edward Jolly, MD;  Location: [place];  Service: General;  Laterality: Right;  . PORTACATH PLACEMENT  07/02/2012   Procedure: INSERTION PORT-A-CATH;  Surgeon: Edward Jolly, MD;  Location: [place];  Service: General;  Laterality: N/A;  right    Current Outpatient Rx  . Order #: [id number] Class: Normal  . Order #: [id number] Class: Historical Med  . Order #: [id number] Class: Normal  . Order #: [id number] Class: Historical Med  . Order #: [id number] Class: Historical Med  . Order #: [id number] Class: Normal  . Order #: [id number] Class: Print    Allergies Aspirin and Iodinated diagnostic agents  Family History  Problem Relation Age of Onset  . Emphysema Mother        was a smoker  . Heart disease Mother   . Melanoma Mother        dx in her 28s  . Asthma Brother   . Breast cancer Cousin        mother's maternal cousin; dx in her 71s    Social History Social History   Tobacco Use  . Smoking status: Current Every Day Smoker    Packs/day: 0.50    Years: 24.00    Pack years: 12.00    Types: Cigarettes  . Smokeless tobacco: Never Used  Substance Use Topics  . Alcohol use: No  . Drug use: No    Review of  Systems  Constitutional: No fever/chills Eyes: No visual changes. ENT: No sore throat. Positive sinus pain.  Cardiovascular: Denies chest pain. Respiratory: Denies shortness of breath. Gastrointestinal: No abdominal pain.  No nausea, no vomiting.  No diarrhea.  No constipation. Genitourinary: Negative for dysuria. Musculoskeletal: Negative for back pain. Skin: Negative for rash. Neurological: Negative for focal weakness or numbness. Positive HA.    10-point ROS otherwise negative.  ____________________________________________   PHYSICAL EXAM:  VITAL SIGNS: ED Triage Vitals  Enc Vitals Group     BP 06/20/17 2046 102/78     Pulse Rate 06/20/17 2046 76     Resp 06/20/17 2046 18     Temp 06/20/17 2046 98.2 F (36.8 C)     Temp Source 06/20/17 2046 Oral     SpO2 06/20/17 2046 100 %     Weight 06/20/17 2047 119 lb (54 kg)     Height 06/20/17 2047 5\' 2"  (1.575 m)     Pain Score 06/20/17 2046 10    Constitutional: Alert and oriented. Well appearing and in no acute distress. Eyes: Conjunctivae are normal. PERRL. EOMI.  Head: Atraumatic. Nose: No congestion/rhinnorhea. Mouth/Throat: Mucous membranes are moist.  Oropharynx non-erythematous. Neck: No stridor.   Cardiovascular: Normal rate, regular rhythm. Good peripheral circulation. Grossly normal heart sounds.   Respiratory: Normal respiratory effort.  No retractions. Lungs CTAB. Gastrointestinal: Soft and nontender. No distention.  Musculoskeletal: No lower extremity tenderness nor edema. No gross deformities of extremities. Neurologic:  Normal speech and language. No gross focal neurologic deficits are appreciated. Normal CN exam. No pronator drift.   Skin:  Skin is warm, dry and intact. No rash noted.  ____________________________________________  RADIOLOGY  None ____________________________________________   PROCEDURES  Procedure(s) performed:    Procedures  None ____________________________________________   INITIAL IMPRESSION / ASSESSMENT AND PLAN / ED COURSE  Pertinent labs & imaging results that were available during my care of the patient were reviewed by me and considered in my medical decision making (see chart for details).  Patient presents to the emergency department for evaluation of headache typical of her migraines with associated face pain not responding to Levaquin.  She was also prescribed steroids but has not started taking these because they bother her stomach.  No focal neurological deficits.  Plan for headache management in the emergency department with follow-up as needed with primary care physician.  11:35 PM Patient feeling much better. Plan for discharge. Provided Neurology follow up contact information to use a needed if HA continues.   At this time, I do not feel there is any life-threatening condition present. I have reviewed and discussed all results (EKG, imaging, lab, urine as appropriate), exam findings with patient. I have reviewed nursing notes and appropriate previous  records.  I feel the patient is safe to be discharged home without further emergent workup. Discussed usual and customary return precautions. Patient and family (if present) verbalize understanding and are comfortable with this plan.  Patient will follow-up with their primary care provider. If they do not have a primary care provider, information for follow-up has been provided to them. All questions have been answered.  ____________________________________________  FINAL CLINICAL IMPRESSION(S) / ED DIAGNOSES  Final diagnoses:  Migraine with aura and without status migrainosus, not intractable     MEDICATIONS GIVEN DURING THIS VISIT:  Medications  sodium chloride 0.9 % bolus 500 mL (0 mLs Intravenous Stopped 06/20/17 2340)  ketorolac (TORADOL) 30 MG/ML injection 30 mg (30 mg Intravenous Given 06/20/17 2244)  metoCLOPramide  (REGLAN) injection 10 mg (10 mg Intravenous Given 06/20/17 2244)  diphenhydrAMINE (BENADRYL) injection 50 mg (50 mg Intravenous Given 06/20/17 2244)  dexamethasone (DECADRON) injection 10 mg (10 mg Intravenous Given 06/20/17 2245)    Note:  This document was prepared using Dragon voice recognition software and may include unintentional dictation errors.  Nanda Quinton, MD Emergency Medicine    Long, [facility], MD 06/20/17 [phone]

## 2017-06-20 NOTE — ED Triage Notes (Signed)
Pt reports having a migraine x 9 days. Pt states that she went to [facility] December 3rd and was told she had a sinus infection. Pt reports she was given and antibiotic and prednisone that hasn't helped. Pt report nausea, photosensitivity, sensitive to noise. Pt also feels pressure behind her eyes and nose.

## 2017-07-27 ENCOUNTER — Inpatient Hospital Stay (HOSPITAL_COMMUNITY)
Admission: EM | Admit: 2017-07-27 | Discharge: 2017-07-29 | DRG: 055 | Disposition: A | Discharge status: Home / Self Care | Payer: 59 | Attending: Internal Medicine | Admitting: Internal Medicine

## 2017-07-27 ENCOUNTER — Inpatient Hospital Stay (HOSPITAL_COMMUNITY): Payer: 59

## 2017-07-27 ENCOUNTER — Other Ambulatory Visit: Payer: Self-pay

## 2017-07-27 ENCOUNTER — Encounter (HOSPITAL_COMMUNITY): Payer: Self-pay

## 2017-07-27 ENCOUNTER — Emergency Department (HOSPITAL_COMMUNITY): Payer: 59

## 2017-07-27 DIAGNOSIS — G4459 Other complicated headache syndrome: Secondary | ICD-10-CM

## 2017-07-27 DIAGNOSIS — Z720 Tobacco use: Secondary | ICD-10-CM | POA: Diagnosis not present

## 2017-07-27 DIAGNOSIS — Z23 Encounter for immunization: Secondary | ICD-10-CM

## 2017-07-27 DIAGNOSIS — C7931 Secondary malignant neoplasm of brain: Principal | ICD-10-CM | POA: Diagnosis present

## 2017-07-27 DIAGNOSIS — G43809 Other migraine, not intractable, without status migrainosus: Secondary | ICD-10-CM

## 2017-07-27 DIAGNOSIS — C50212 Malignant neoplasm of upper-inner quadrant of left female breast: Secondary | ICD-10-CM | POA: Diagnosis not present

## 2017-07-27 DIAGNOSIS — Z170 Estrogen receptor positive status [ER+]: Secondary | ICD-10-CM | POA: Diagnosis not present

## 2017-07-27 DIAGNOSIS — J45909 Unspecified asthma, uncomplicated: Secondary | ICD-10-CM | POA: Diagnosis present

## 2017-07-27 DIAGNOSIS — Z923 Personal history of irradiation: Secondary | ICD-10-CM

## 2017-07-27 DIAGNOSIS — Z9221 Personal history of antineoplastic chemotherapy: Secondary | ICD-10-CM

## 2017-07-27 DIAGNOSIS — Z902 Acquired absence of lung [part of]: Secondary | ICD-10-CM

## 2017-07-27 DIAGNOSIS — Z853 Personal history of malignant neoplasm of breast: Secondary | ICD-10-CM | POA: Diagnosis not present

## 2017-07-27 DIAGNOSIS — Z886 Allergy status to analgesic agent status: Secondary | ICD-10-CM | POA: Diagnosis not present

## 2017-07-27 DIAGNOSIS — G43909 Migraine, unspecified, not intractable, without status migrainosus: Secondary | ICD-10-CM | POA: Diagnosis present

## 2017-07-27 DIAGNOSIS — Z79899 Other long term (current) drug therapy: Secondary | ICD-10-CM

## 2017-07-27 DIAGNOSIS — M1990 Unspecified osteoarthritis, unspecified site: Secondary | ICD-10-CM | POA: Diagnosis present

## 2017-07-27 DIAGNOSIS — R220 Localized swelling, mass and lump, head: Secondary | ICD-10-CM | POA: Diagnosis not present

## 2017-07-27 DIAGNOSIS — Z79810 Long term (current) use of selective estrogen receptor modulators (SERMs): Secondary | ICD-10-CM

## 2017-07-27 DIAGNOSIS — F17210 Nicotine dependence, cigarettes, uncomplicated: Secondary | ICD-10-CM | POA: Diagnosis present

## 2017-07-27 DIAGNOSIS — Z91048 Other nonmedicinal substance allergy status: Secondary | ICD-10-CM | POA: Diagnosis not present

## 2017-07-27 DIAGNOSIS — G9389 Other specified disorders of brain: Secondary | ICD-10-CM

## 2017-07-27 DIAGNOSIS — G939 Disorder of brain, unspecified: Secondary | ICD-10-CM | POA: Diagnosis not present

## 2017-07-27 LAB — COMPREHENSIVE METABOLIC PANEL
ALBUMIN: 3.6 g/dL (ref 3.5–5.0)
ALT: 18 U/L (ref 14–54)
ANION GAP: 10 (ref 5–15)
AST: 20 U/L (ref 15–41)
Alkaline Phosphatase: 51 U/L (ref 38–126)
BUN: 8 mg/dL (ref 6–20)
CHLORIDE: 106 mmol/L (ref 101–111)
CO2: 24 mmol/L (ref 22–32)
Calcium: 9.2 mg/dL (ref 8.9–10.3)
Creatinine, Ser: 0.99 mg/dL (ref 0.44–1.00)
GFR calc Af Amer: >60 mL/min (ref >60)
GFR calc non Af Amer: >60 mL/min (ref >60)
GLUCOSE: 89 mg/dL (ref 65–99)
Potassium: 3.8 mmol/L (ref 3.5–5.1)
SODIUM: 140 mmol/L (ref 135–145)
Total Bilirubin: 0.4 mg/dL (ref 0.3–1.2)
Total Protein: 6.4 g/dL — ABNORMAL LOW (ref 6.5–8.1)

## 2017-07-27 LAB — CBC
HCT: 37.3 % (ref 36.0–46.0)
Hemoglobin: 12.6 g/dL (ref 12.0–15.0)
MCH: 32.4 pg (ref 26.0–34.0)
MCHC: 33.8 g/dL (ref 30.0–36.0)
MCV: 95.9 fL (ref 78.0–100.0)
PLATELETS: 234 10*3/uL (ref 150–400)
RBC: 3.89 MIL/uL (ref 3.87–5.11)
RDW: 13.6 % (ref 11.5–15.5)
WBC: 9.7 10*3/uL (ref 4.0–10.5)

## 2017-07-27 LAB — CBC WITH DIFFERENTIAL/PLATELET
BASOS PCT: 1 %
Basophils Absolute: 0.1 10*3/uL (ref 0.0–0.1)
EOS ABS: 0.3 10*3/uL (ref 0.0–0.7)
EOS PCT: 4 %
HCT: 37.3 % (ref 36.0–46.0)
Hemoglobin: 12.8 g/dL (ref 12.0–15.0)
LYMPHS ABS: 3.2 10*3/uL (ref 0.7–4.0)
Lymphocytes Relative: 40 %
MCH: 32.7 pg (ref 26.0–34.0)
MCHC: 34.3 g/dL (ref 30.0–36.0)
MCV: 95.4 fL (ref 78.0–100.0)
Monocytes Absolute: 0.5 10*3/uL (ref 0.1–1.0)
Monocytes Relative: 6 %
Neutro Abs: 4 10*3/uL (ref 1.7–7.7)
Neutrophils Relative %: 49 %
PLATELETS: 226 10*3/uL (ref 150–400)
RBC: 3.91 MIL/uL (ref 3.87–5.11)
RDW: 13.7 % (ref 11.5–15.5)
WBC: 8 10*3/uL (ref 4.0–10.5)

## 2017-07-27 LAB — CREATININE, SERUM
CREATININE: 1.17 mg/dL — AB (ref 0.44–1.00)
GFR calc Af Amer: >60 mL/min (ref >60)
GFR calc non Af Amer: 54 mL/min — ABNORMAL LOW (ref >60)

## 2017-07-27 MED ORDER — NICOTINE 21 MG/24HR TD PT24
21.0000 mg | MEDICATED_PATCH | Freq: Every day | TRANSDERMAL | Status: DC
  Administered 2017-07-27 – 2017-07-28 (×2): 21 mg via TRANSDERMAL
  Filled 2017-07-27 (×4): qty 1

## 2017-07-27 MED ORDER — HEPARIN SODIUM (PORCINE) 5000 UNIT/ML IJ SOLN
5000.0000 [IU] | Freq: Three times a day (TID) | INTRAMUSCULAR | Status: DC
  Administered 2017-07-27 – 2017-07-29 (×5): 5000 [IU] via SUBCUTANEOUS
  Filled 2017-07-27 (×5): qty 1

## 2017-07-27 MED ORDER — ALBUTEROL SULFATE (2.5 MG/3ML) 0.083% IN NEBU: 3.0000 mL | INHALATION_SOLUTION | Freq: Four times a day (QID) | RESPIRATORY_TRACT | Status: DC | PRN

## 2017-07-27 MED ORDER — SODIUM CHLORIDE 0.9 % IV SOLN
INTRAVENOUS | Status: DC
  Administered 2017-07-27 – 2017-07-29 (×3): via INTRAVENOUS

## 2017-07-27 MED ORDER — ONDANSETRON HCL 4 MG PO TABS: 4.0000 mg | ORAL_TABLET | Freq: Four times a day (QID) | ORAL | Status: DC | PRN

## 2017-07-27 MED ORDER — ONDANSETRON HCL 4 MG/2ML IJ SOLN
4.0000 mg | Freq: Four times a day (QID) | INTRAMUSCULAR | Status: DC | PRN
Start: 2017-07-27 — End: 2017-07-29

## 2017-07-27 MED ORDER — DIPHENHYDRAMINE HCL 50 MG/ML IJ SOLN: 50.0000 mg | Freq: Once | INTRAMUSCULAR | Status: AC

## 2017-07-27 MED ORDER — DIPHENHYDRAMINE HCL 25 MG PO CAPS: 50.0000 mg | ORAL_CAPSULE | Freq: Once | ORAL | Status: DC

## 2017-07-27 MED ORDER — TAMOXIFEN CITRATE 10 MG PO TABS
20.0000 mg | ORAL_TABLET | Freq: Every day | ORAL | Status: DC
  Administered 2017-07-27 – 2017-07-28 (×2): 20 mg via ORAL
  Filled 2017-07-27 (×2): qty 2

## 2017-07-27 MED ORDER — HYDROCODONE-ACETAMINOPHEN 5-325 MG PO TABS: 1.0000 | ORAL_TABLET | ORAL | Status: DC | PRN

## 2017-07-27 MED ORDER — ACETAMINOPHEN 325 MG PO TABS: 650.0000 mg | ORAL_TABLET | Freq: Four times a day (QID) | ORAL | Status: DC | PRN

## 2017-07-27 MED ORDER — DIPHENHYDRAMINE HCL 25 MG PO CAPS
50.0000 mg | ORAL_CAPSULE | Freq: Once | ORAL | Status: AC
  Administered 2017-07-28: 50 mg via ORAL
  Filled 2017-07-27: qty 2

## 2017-07-27 MED ORDER — DIPHENHYDRAMINE HCL 50 MG/ML IJ SOLN: 50.0000 mg | Freq: Once | INTRAMUSCULAR | Status: DC

## 2017-07-27 MED ORDER — FAMOTIDINE 20 MG PO TABS
20.0000 mg | ORAL_TABLET | Freq: Every day | ORAL | Status: DC
  Administered 2017-07-27 – 2017-07-28 (×2): 20 mg via ORAL
  Filled 2017-07-27 (×2): qty 1

## 2017-07-27 MED ORDER — GADOBENATE DIMEGLUMINE 529 MG/ML IV SOLN
10.0000 mL | Freq: Once | INTRAVENOUS | Status: AC | PRN
  Administered 2017-07-27: 10 mL via INTRAVENOUS

## 2017-07-27 MED ORDER — MOMETASONE FURO-FORMOTEROL FUM 200-5 MCG/ACT IN AERO
2.0000 | INHALATION_SPRAY | Freq: Two times a day (BID) | RESPIRATORY_TRACT | Status: DC
  Administered 2017-07-27 – 2017-07-29 (×4): 2 via RESPIRATORY_TRACT
  Filled 2017-07-27: qty 8.8

## 2017-07-27 MED ORDER — PREDNISONE 50 MG PO TABS
50.0000 mg | ORAL_TABLET | Freq: Four times a day (QID) | ORAL | Status: AC
  Administered 2017-07-27 – 2017-07-28 (×3): 50 mg via ORAL
  Filled 2017-07-27: qty 1
  Filled 2017-07-27: qty 3
  Filled 2017-07-27: qty 1

## 2017-07-27 MED ORDER — DEXAMETHASONE SODIUM PHOSPHATE 4 MG/ML IJ SOLN
4.0000 mg | Freq: Four times a day (QID) | INTRAMUSCULAR | Status: DC
  Administered 2017-07-27 – 2017-07-29 (×7): 4 mg via INTRAVENOUS
  Filled 2017-07-27 (×7): qty 1

## 2017-07-27 MED ORDER — ACETAMINOPHEN 650 MG RE SUPP: 650.0000 mg | Freq: Four times a day (QID) | RECTAL | Status: DC | PRN

## 2017-07-27 MED ORDER — PREDNISONE 20 MG PO TABS: 50.0000 mg | ORAL_TABLET | Freq: Four times a day (QID) | ORAL | Status: DC

## 2017-07-27 NOTE — H&P (Signed)
History and Physical        Hospital Admission Note Date: 07/27/2017  Patient name: Melissa [name] Medical record number: [id number] Date of birth: Nov 25, 1965 Age: 52 y.o. Gender: female  PCP: Pa, [facility]    Patient coming from:   I have reviewed all records in the [facility].    Chief Complaint:  Headaches for the last 4-5 weeks   HPI: Patient is a 52 year old with history of active nicotine abuse, history of [place] fever in [place] requiring left upper lung lobectomy in 1997, history of invasive ductal carcinoma, ER positive, PR positive, diagnosed in 05/2012, underwent neoadjuvant chemotherapy 07/2012-07/2013, left breast lumpectomy in June 2014, radiation therapy in 2014, currently on tamoxifen presented to ED with severe headaches in the last 4-5 weeks.  Patient reported that for the last 1 month she was having symptoms of cold, chest congestion, sinusitis.  She was treated with antibiotic and was now taking Augmentin.  Patient was also seen in [facility] ED on 06/20/17 and was treated for migraine headache, headache improved however returned back next day.  Per patient, she saw her PCP and was going to schedule CT scan due to persistent headaches.  Patient reported that she came to ED today as she was tired of waiting.  She also reported nausea, vomiting, headache as 8/10 frontal as well as bilateral temporal and sometimes radiating to the back of the head.  Patient initially thought that she was having sinus headache or migraine headaches.  ED work-up/course:  Temp 98.1, respiratory rate 16, pulse 80, BP 105/57, O2 sats 94% on room air CT head showed large right frontal lobe mass, may represent primary CNS neoplasm or focus of metastatic disease.  Surrounding edema with mass-effect and right-to-left midline shift measuring 8 mm. CBC, BMET  unremarkable, creatinine 0.9  Review of Systems: Positives marked in 'bold' Constitutional: Denies fever, chills, diaphoresis, + poor appetite and fatigue.  HEENT: Denies photophobia, eye pain, redness, hearing loss, ear pain, trouble swallowing, neck pain, neck stiffness and tinnitus.  Patient reported blurry vision since the headaches however she usually gets blurry vision with aura with migraines.  Respiratory: Denies SOB, DOE, cough, chest tightness,  and + mild wheezing.   Cardiovascular: Denies chest pain, palpitations and leg swelling.  Gastrointestinal: denies abdominal pain, diarrhea, constipation, blood in stool and abdominal distention. + Nausea and vomiting Genitourinary: Denies dysuria, urgency, frequency, hematuria, flank pain and difficulty urinating.  Musculoskeletal: Denies myalgias, back pain, joint swelling, arthralgias and gait problem.  Skin: Denies pallor, rash and wound.  Neurological: See HPI  hematological: Denies adenopathy. Easy bruising, personal or family bleeding history  Psychiatric/Behavioral: Denies suicidal ideation, mood changes, confusion, nervousness, sleep disturbance and agitation  Past Medical History: Past Medical History:  Diagnosis Date  . Arthritis    knees and hips  . Asthma   . Bronchiolitis   . Cancer [facility]    breast cancer 2014  . H/O coccidioidomycosis    was reason for lung lobectomy  . Headache(784.0)    due to eye strain or not eating  . History of anemia    no current problem  . History of  asthma    as a child  . History of breast cancer 2014   left  . History of chemotherapy    finished 07/17/2013  . Hx of radiation therapy 03/25/13-05/06/13   left breast 5000 cGy/25 sessions, left breast boost 1000 cGy/5 sessions  . Runny nose 07/30/2013   clear drainage  . Wears dentures    upper    Past Surgical History:  Procedure Laterality Date  . AXILLARY LYMPH NODE DISSECTION Left 02/08/2013   Procedure: LEFT AXILLARY DISSECTION;   Surgeon: Edward Jolly, MD;  Location: [place];  Service: General;  Laterality: Left;  . BREAST CYST EXCISION Right 2006  . BREAST LUMPECTOMY WITH NEEDLE LOCALIZATION AND AXILLARY SENTINEL LYMPH NODE BX Left 12/31/2012   Procedure: NEEDLE LOCALIZATION LEFT BREAST LUMPECTOMY AND LEFT AXILLARY SENTENIAL LYMPH NODE BX;  Surgeon: Edward Jolly, MD;  Location: [place];  Service: General;  Laterality: Left;  . CESAREAN SECTION  1995/1996  . LUNG LOBECTOMY Left 05/1996   upper lobe - due to [facility] Fever  . PORT-A-CATH REMOVAL Right 08/02/2013   Procedure: REMOVAL PORT-A-CATH;  Surgeon: Edward Jolly, MD;  Location: [place];  Service: General;  Laterality: Right;  . PORTACATH PLACEMENT  07/02/2012   Procedure: INSERTION PORT-A-CATH;  Surgeon: Edward Jolly, MD;  Location: [place];  Service: General;  Laterality: N/A;  right    Medications: Prior to Admission medications   Medication Sig Start Date End Date Taking? Authorizing Provider  albuterol (VENTOLIN HFA) 108 (90 BASE) MCG/ACT inhaler Inhale 2 puffs into the lungs every 6 (six) hours as needed. 12/20/12  Yes Causey, Charlestine Massed, NP  amoxicillin-clavulanate (AUGMENTIN) 875-125 MG tablet Take 1 tablet by mouth every 12 (twelve) hours. 07/24/17  Yes [provider]  budesonide-formoterol (SYMBICORT) 160-4.5 MCG/ACT inhaler Inhale 2 puffs into the lungs daily.   Yes [provider]  famotidine (PEPCID) 20 MG tablet One at bedtime Patient taking differently: Take 20 mg by mouth at bedtime.  08/09/16  Yes Tanda Rockers, MD  naproxen sodium (ALEVE) 220 MG tablet Take 220 mg by mouth 2 (two) times daily as needed (headache).    Yes [provider]  pseudoephedrine (SUDAFED) 30 MG tablet Take 30 mg by mouth every 4 (four) hours as needed for congestion.   Yes [provider]  Respiratory Therapy Supplies (FLUTTER) DEVI Use  as directed 08/09/16  Yes Tanda Rockers, MD  tamoxifen (NOLVADEX) 20 MG tablet TAKE 1 TABLET BY MOUTH DAILY 11/08/16  Yes Nicholas Lose, MD    Allergies:   Allergies  Allergen Reactions  . Aspirin Anaphylaxis    THROAT CLOSES  . Iodinated Diagnostic Agents Rash    Social History:  reports that she has been smoking cigarettes.  She has a 12.00 pack-year smoking history. she has never used smokeless tobacco. She reports that she does not drink alcohol or use drugs.  Family History: Family History  Problem Relation Age of Onset  . Emphysema Mother        was a smoker  . Heart disease Mother   . Melanoma Mother        dx in her 72s  . Asthma Brother   . Breast cancer Cousin        mother's maternal cousin; dx in her 57s    Physical Exam: Blood pressure 101/68, pulse 80, temperature 98.2 F (36.8 C), temperature source Oral, resp. rate 18, height 5\' 2"  (  1.575 m), weight 54.4 kg (120 lb), SpO2 96 %. General: Alert, awake, oriented x3, in no acute distress. Eyes: pink conjunctiva,anicteric sclera, pupils equal and reactive to light and accomodation, HEENT: normocephalic, atraumatic, oropharynx clear Neck: supple, no masses or lymphadenopathy, no goiter, no bruits, no JVD CVS: Regular rate and rhythm, without murmurs, rubs or gallops. No lower extremity edema Resp : Clear to auscultation bilaterally, no wheezing, rales or rhonchi. GI : Soft, nontender, nondistended, positive bowel sounds, no masses. No hepatomegaly. No hernia.  Musculoskeletal: No clubbing or cyanosis, positive pedal pulses. No contracture. ROM intact  Neuro: Grossly intact, no focal neurological deficits, strength 5/5 upper and lower extremities bilaterally Psych: alert and oriented x 3, normal mood and affect Skin: no rashes or lesions, warm and dry   LABS on Admission: I have personally reviewed all the labs and imagings below    Basic Metabolic Panel: Recent Labs  Lab 07/27/17 1619  NA 140  K 3.8  CL  106  CO2 24  GLUCOSE 89  BUN 8  CREATININE 0.99  CALCIUM 9.2   Liver Function Tests: Recent Labs  Lab 07/27/17 1619  AST 20  ALT 18  ALKPHOS 51  BILITOT 0.4  PROT 6.4*  ALBUMIN 3.6   No results for input(s): LIPASE, AMYLASE in the last 168 hours. No results for input(s): AMMONIA in the last 168 hours. CBC: Recent Labs  Lab 07/27/17 1619  WBC 8.0  NEUTROABS 4.0  HGB 12.8  HCT 37.3  MCV 95.4  PLT 226   Cardiac Enzymes: No results for input(s): CKTOTAL, CKMB, CKMBINDEX, TROPONINI in the last 168 hours. BNP: Invalid input(s): POCBNP CBG: No results for input(s): GLUCAP in the last 168 hours.  Radiological Exams on Admission:  Ct Head Wo Contrast  Result Date: 07/27/2017 CLINICAL DATA:  Intermittent headaches. EXAM: CT HEAD WITHOUT CONTRAST TECHNIQUE: Contiguous axial images were obtained from the base of the skull through the vertex without intravenous contrast. COMPARISON:  None. FINDINGS: Brain: Within the anterior right frontal lobe there is a mass measuring 3.1 by 2.5 cm, image 15 of series 3. There is a large area of surrounding low attenuation edema involving much of the right frontal lobe. Low attenuation edema appears to extend into the corpus callosum and may cross the midline. There is significant mass effect with right to left midline shift measuring 8 mm. No acute intracranial hemorrhage identified.  No hydrocephalus. Vascular: No hyperdense vessel or unexpected calcification. Skull: The calvarium appears intact. Sinuses/Orbits: Paranasal sinuses and mastoid air cells are clear. Other: None IMPRESSION: 1. Large right frontal lobe mass is identified. This may represent a primary CNS neoplasm or focus of metastatic disease. There is significant surrounding low attenuation edema with mass effect and right to late midline shift measuring 8 mm. Further investigation with contrast enhanced brain MRI is advised. Critical Value/emergent results were called by telephone at the  time of interpretation on 07/27/2017 at 3:28 pm to Geanie Kenning, PA, who verbally acknowledged these results. Electronically Signed   By: Kerby Moors M.D.   On: 07/27/2017 15:28         Assessment/Plan Principal Problem:   Frontal mass of brain with surrounding edema could be primary CNS neoplasm versus metastatic disease due to history of breast cancer -Admit to telemetry, neurosurgery has been consulted, started on IV Decadron 4 mg every 6 hours -Neurosurgery recommended MRI brain with and without contrast for further characterization of mass -will also obtain CT chest, abdomen, pelvis.  Patient  has contrast allergy hence placed on steroids, Benadryl for prophylaxis. -Patient follows Dr. Lindi Adie as her oncologist, who will be consulted  Active Problems:   Breast cancer of upper-inner quadrant of left female breast [facility]) - history of invasive ductal carcinoma, ER positive, PR positive, diagnosed in 05/2012, underwent neoadjuvant chemotherapy 07/2012-07/2013, left breast lumpectomy in June 2014, radiation therapy in 2014, currently on tamoxifen  -Follows Dr. Lindi Adie, last visit in 08/2016, per patient, she was doing well and was recommended to return to the clinic in 1 year follow-up -Pending imaging results, Dr. Lindi Adie will be consulted    Nicotine abuse -Patient strongly recommended to quit smoking, placed on nicotine patch    Migraines -Headaches likely due to large frontal mass, started on IV steroids.  Follow closely regarding the migraines.  DVT prophylaxis: Heparin subcu  CODE STATUS: Full code  Consults called: Neurosurgery  Family Communication: Admission, patients condition and plan of care including tests being ordered have been discussed with the patient and husband who indicates understanding and agree with the plan and Code Status  Admission status: Inpatient, tele  Disposition plan: Further plan will depend as patient's clinical course evolves and further  radiologic and laboratory data become available.    At the time of admission, it appears that the appropriate admission status for this patient is INPATIENT . This is judged to be reasonable and necessary in order to provide the required intensity of service to ensure the patient's safety given the presenting symptoms persistent headaches, nausea, vomiting for last 1 months with large frontal tumor on CT scan, physical exam findings, and initial radiographic and laboratory data in the context of their chronic comorbidities.  The medical decision making on this patient was of high complexity and the patient is at high risk for clinical deterioration, therefore this is a level 3 visit.   Time Spent on Admission: 60-minute   Marilena Trevathan M.D. [facility] 07/27/2017, 6:48 PM Pager: [phone]  If 7PM-7AM, please contact night-coverage [email or web address] Password [email or web address]

## 2017-07-27 NOTE — Consult Note (Signed)
Chief Complaint   Chief Complaint  Patient presents with  . Headache    HPI   HPI: Melissa [name] is a 52 y.o. female who presented to ER due to headache x6-8 weeks. Headache is typically a severe, pressure sensation in frontal and occiput. Associated with ear pressure. Has been to PCP multiple times and treated for sinusitis. States when on steroids, pain would improve temporarily but always return. There is no associated dizziness, nausea, vomiting, motor/sensory deficits, changes in vision, photophobia1. No issues with memory, following directions, fine motor skills, emotional disturbance. Of note, she has a history of breast cancer in 2014. Tx with lumpectomy, chemo/radiation. Has been following up regularly for mammograms although missed last mammogram October 2018. No other history of cancer. No FH of brain tumors.    Patient Active Problem List   Diagnosis Date Noted  . HCAP (healthcare-associated pneumonia) 11/26/2016  . Upper airway cough syndrome 08/10/2016  . Abnormal echocardiogram 08/07/2013  . Chest tightness or pressure 08/07/2013  . Hx of radiation therapy   . Edema of left lower extremity 11/19/2012  . Tachycardia 09/20/2012  . 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 Obstructive bronchiectasis ([place]) 01/26/2011  . Cigarette smoker 01/26/2011    PMH: Past Medical History:  Diagnosis Date  . Arthritis    knees and hips  . Asthma   . Bronchiolitis   . Cancer [facility])    breast cancer 2014  . H/O coccidioidomycosis    was reason for lung lobectomy  . Headache(784.0)    due to eye strain or not eating  . History of anemia    no current problem  . History of asthma    as a child  . History of breast cancer 2014   left  . History of chemotherapy    finished 07/17/2013  . Hx of radiation therapy 03/25/13-05/06/13   left breast 5000 cGy/25 sessions, left breast boost 1000 cGy/5 sessions  . Runny nose 07/30/2013   clear drainage   . Wears dentures    upper    PSH: Past Surgical History:  Procedure Laterality Date  . AXILLARY LYMPH NODE DISSECTION Left 02/08/2013   Procedure: LEFT AXILLARY DISSECTION;  Surgeon: Edward Jolly, MD;  Location: [place];  Service: General;  Laterality: Left;  . BREAST CYST EXCISION Right 2006  . BREAST LUMPECTOMY WITH NEEDLE LOCALIZATION AND AXILLARY SENTINEL LYMPH NODE BX Left 12/31/2012   Procedure: NEEDLE LOCALIZATION LEFT BREAST LUMPECTOMY AND LEFT AXILLARY SENTENIAL LYMPH NODE BX;  Surgeon: Edward Jolly, MD;  Location: [place];  Service: General;  Laterality: Left;  . CESAREAN SECTION  1995/1996  . LUNG LOBECTOMY Left 05/1996   upper lobe - due to [facility] Fever  . PORT-A-CATH REMOVAL Right 08/02/2013   Procedure: REMOVAL PORT-A-CATH;  Surgeon: Edward Jolly, MD;  Location: [place];  Service: General;  Laterality: Right;  . PORTACATH PLACEMENT  07/02/2012   Procedure: INSERTION PORT-A-CATH;  Surgeon: Edward Jolly, MD;  Location: [place];  Service: General;  Laterality: N/A;  right     (Not in a hospital admission)  SH: Social History   Tobacco Use  . Smoking status: Current Every Day Smoker    Packs/day: 0.50    Years: 24.00    Pack years: 12.00    Types: Cigarettes  . Smokeless tobacco: Never Used  Substance Use Topics  . Alcohol use: No  . Drug  use: No    MEDS: Prior to Admission medications   Medication Sig Start Date End Date Taking? Authorizing Provider  albuterol (VENTOLIN HFA) 108 (90 BASE) MCG/ACT inhaler Inhale 2 puffs into the lungs every 6 (six) hours as needed. 12/20/12  Yes Causey, Charlestine Massed, NP  amoxicillin-clavulanate (AUGMENTIN) 875-125 MG tablet Take 1 tablet by mouth every 12 (twelve) hours. 07/24/17  Yes [provider]  budesonide-formoterol (SYMBICORT) 160-4.5 MCG/ACT inhaler Inhale 2 puffs into the lungs daily.   Yes [provider]  famotidine (PEPCID) 20 MG tablet One at bedtime Patient taking differently: Take 20 mg by mouth at bedtime.  08/09/16  Yes Tanda Rockers, MD  naproxen sodium (ALEVE) 220 MG tablet Take 220 mg by mouth 2 (two) times daily as needed (headache).    Yes [provider]  pseudoephedrine (SUDAFED) 30 MG tablet Take 30 mg by mouth every 4 (four) hours as needed for congestion.   Yes [provider]  Respiratory Therapy Supplies (FLUTTER) DEVI Use as directed 08/09/16  Yes Tanda Rockers, MD  tamoxifen (NOLVADEX) 20 MG tablet TAKE 1 TABLET BY MOUTH DAILY 11/08/16  Yes Nicholas Lose, MD    ALLERGY: Allergies  Allergen Reactions  . Aspirin Anaphylaxis    THROAT CLOSES  . Iodinated Diagnostic Agents Rash    Social History   Tobacco Use  . Smoking status: Current Every Day Smoker    Packs/day: 0.50    Years: 24.00    Pack years: 12.00    Types: Cigarettes  . Smokeless tobacco: Never Used  Substance Use Topics  . Alcohol use: No     Family History  Problem Relation Age of Onset  . Emphysema Mother        was a smoker  . Heart disease Mother   . Melanoma Mother        dx in her 50s  . Asthma Brother   . Breast cancer Cousin        mother's maternal cousin; dx in her 31s     ROS   Review of Systems  Constitutional: Negative.   HENT: Negative.   Eyes: Negative.   Respiratory: Negative.   Cardiovascular: Negative.   Gastrointestinal: Negative.   Genitourinary: Negative.   Musculoskeletal: Negative.   Skin: Negative.   Neurological: Positive for headaches. Negative for dizziness, tingling, tremors, sensory change, speech change, focal weakness, seizures and loss of consciousness.    Exam   Vitals:   07/27/17 1549 07/27/17 1615  BP: (!) 153/82 101/68  Pulse: 72 60  Resp: 16   Temp: 98.2 F (36.8 C)   SpO2: 99% 99%   General appearance: WDWN, NAD, resting comfortably Eyes: PERRL, Fundoscopic: normal Cardiovascular: Regular rate and rhythm without  murmurs, rubs, gallops. No edema or variciosities. Distal pulses normal. Pulmonary: Clear to auscultation Musculoskeletal:     Muscle tone upper extremities: Normal    Muscle tone lower extremities: Normal    Motor exam: Upper Extremities Deltoid Bicep Tricep Grip  Right 5/5 5/5 5/5 5/5  Left 5/5 5/5 5/5 5/5   Lower Extremity IP Quad PF DF EHL  Right 5/5 5/5 5/5 5/5 5/5  Left 5/5 5/5 5/5 5/5 5/5   Neurological Awake, alert, oriented Memory and concentration grossly intact Speech fluent, appropriate CNII: Visual fields normal CNIII/IV/VI: EOMI CNV: Facial sensation normal CNVII: Symmetric, normal strength CNVIII: Grossly normal CNIX: Normal palate movement CNXI: Trap and SCM strength normal CN XII: Tongue protrusion normal Sensation grossly intact to  LT DTR: Normal Coordination (finger/nose & heel/shin): Normal No pronator drift  Results - Imaging/Labs   No results found for this or any previous visit (from the past 67 hour(s)).  Ct Head Wo Contrast  Result Date: 07/27/2017 CLINICAL DATA:  Intermittent headaches. EXAM: CT HEAD WITHOUT CONTRAST TECHNIQUE: Contiguous axial images were obtained from the base of the skull through the vertex without intravenous contrast. COMPARISON:  None. FINDINGS: Brain: Within the anterior right frontal lobe there is a mass measuring 3.1 by 2.5 cm, image 15 of series 3. There is a large area of surrounding low attenuation edema involving much of the right frontal lobe. Low attenuation edema appears to extend into the corpus callosum and may cross the midline. There is significant mass effect with right to left midline shift measuring 8 mm. No acute intracranial hemorrhage identified.  No hydrocephalus. Vascular: No hyperdense vessel or unexpected calcification. Skull: The calvarium appears intact. Sinuses/Orbits: Paranasal sinuses and mastoid air cells are clear. Other: None IMPRESSION: 1. Large right frontal lobe mass is identified. This may  represent a primary CNS neoplasm or focus of metastatic disease. There is significant surrounding low attenuation edema with mass effect and right to late midline shift measuring 8 mm. Further investigation with contrast enhanced brain MRI is advised. Critical Value/emergent results were called by telephone at the time of interpretation on 07/27/2017 at 3:28 pm to Geanie Kenning, PA, who verbally acknowledged these results. Electronically Signed   By: Kerby Moors M.D.   On: 07/27/2017 15:28   Impression/Plan   52 y.o. female with large right frontal mass with surrounding edema. She is neurologically intact. Mass could represent primary CNS neoplasm, but due to history of breast cancer, metastatic disease cannot be ruled out. Rec admission under TH for work up. - Decadron 4mg  q 6 for edema - MRI brain w/wo contrast for further characterization of mass - May need to undergo CT chest/abd/pelvis pending MRI   Will follow. Please call for any concerns.

## 2017-07-27 NOTE — ED Notes (Signed)
Attempted report 

## 2017-07-27 NOTE — ED Notes (Signed)
Back from MRI, no changes, alert, NAD, calm, interactive. Report called to floor.

## 2017-07-27 NOTE — ED Notes (Signed)
Alert, NAD, calm, interactive, resps e/u, speaking in clear complete sentences, no dyspnea noted, skin W&D, VSS, rates HA 2/10, (denies: other pain, sob, nausea, dizziness, weakness, numbness, tingling or visual changes).Family at [facility]. L arm restricted, band placed. Pt to MRI now. Will give prednisone upon return from MRI. Pt to get 13 hr steroid prep for CT (at 13, 7 and 1 hr prior to CT), CT aware. Nicotine patch held until after MRI.

## 2017-07-27 NOTE — ED Provider Notes (Signed)
[facility] EMERGENCY DEPARTMENT Provider Note   CSN: [id number] Arrival date & time: 07/27/17  1123     History   Chief Complaint Chief Complaint  Patient presents with  . Headache    HPI Melissa [name] is a 52 y.o. female with hx of asthma, arthritis, breast cancer 2014, anemia, coccidioidomycosis who presents to the ED with a headache that has been persistent x 1 month.the headache is located bilateral temporal area and radiates to the back of the head. Patient reports facial pain and sinus pressure and pain.  Patient reports sinus infection and ear infection. Treated with one antibiotic and now taking Augmentin. Patient has been evaluated by her PCP, at [facility] and they discussed doing CT scan due to the persistent headache.   HPI  Past Medical History:  Diagnosis Date  . Arthritis    knees and hips  . Asthma   . Bronchiolitis   . Cancer [facility])    breast cancer 2014  . H/O coccidioidomycosis    was reason for lung lobectomy  . Headache(784.0)    due to eye strain or not eating  . History of anemia    no current problem  . History of asthma    as a child  . History of breast cancer 2014   left  . History of chemotherapy    finished 07/17/2013  . Hx of radiation therapy 03/25/13-05/06/13   left breast 5000 cGy/25 sessions, left breast boost 1000 cGy/5 sessions  . Runny nose 07/30/2013   clear drainage  . Wears dentures    upper    Patient Active Problem List   Diagnosis Date Noted  . Frontal mass of brain 07/27/2017  . HCAP (healthcare-associated pneumonia) 11/26/2016  . Upper airway cough syndrome 08/10/2016  . Abnormal echocardiogram 08/07/2013  . Chest tightness or pressure 08/07/2013  . Hx of radiation therapy   . Edema of left lower extremity 11/19/2012  . Tachycardia 09/20/2012  . Breast cancer of upper-inner quadrant of left female breast ([place]) 06/08/2012  . Obstructive bronchiectasis ([place]) 01/26/2011  . Cigarette smoker  01/26/2011    Past Surgical History:  Procedure Laterality Date  . AXILLARY LYMPH NODE DISSECTION Left 02/08/2013   Procedure: LEFT AXILLARY DISSECTION;  Surgeon: Edward Jolly, MD;  Location: [place];  Service: General;  Laterality: Left;  . BREAST CYST EXCISION Right 2006  . BREAST LUMPECTOMY WITH NEEDLE LOCALIZATION AND AXILLARY SENTINEL LYMPH NODE BX Left 12/31/2012   Procedure: NEEDLE LOCALIZATION LEFT BREAST LUMPECTOMY AND LEFT AXILLARY SENTENIAL LYMPH NODE BX;  Surgeon: Edward Jolly, MD;  Location: [place];  Service: General;  Laterality: Left;  . CESAREAN SECTION  1995/1996  . LUNG LOBECTOMY Left 05/1996   upper lobe - due to [facility] Fever  . PORT-A-CATH REMOVAL Right 08/02/2013   Procedure: REMOVAL PORT-A-CATH;  Surgeon: Edward Jolly, MD;  Location: [place];  Service: General;  Laterality: Right;  . PORTACATH PLACEMENT  07/02/2012   Procedure: INSERTION PORT-A-CATH;  Surgeon: Edward Jolly, MD;  Location: [place];  Service: General;  Laterality: N/A;  right    OB History    No data available       Home Medications    Prior to Admission medications   Medication Sig Start Date End Date Taking? Authorizing Provider  albuterol (VENTOLIN HFA) 108 (90 BASE) MCG/ACT inhaler Inhale 2 puffs into the lungs every 6 (six) hours as needed. 12/20/12  Yes Causey, Charlestine Massed, NP  amoxicillin-clavulanate (AUGMENTIN) 875-125 MG tablet Take 1 tablet by mouth every 12 (twelve) hours. 07/24/17  Yes [provider]  budesonide-formoterol (SYMBICORT) 160-4.5 MCG/ACT inhaler Inhale 2 puffs into the lungs daily.   Yes [provider]  famotidine (PEPCID) 20 MG tablet One at bedtime Patient taking differently: Take 20 mg by mouth at bedtime.  08/09/16  Yes Tanda Rockers, MD  naproxen sodium (ALEVE) 220 MG tablet Take 220 mg by mouth 2 (two) times daily as needed (headache).    Yes  [provider]  pseudoephedrine (SUDAFED) 30 MG tablet Take 30 mg by mouth every 4 (four) hours as needed for congestion.   Yes [provider]  Respiratory Therapy Supplies (FLUTTER) DEVI Use as directed 08/09/16  Yes Tanda Rockers, MD  tamoxifen (NOLVADEX) 20 MG tablet TAKE 1 TABLET BY MOUTH DAILY 11/08/16  Yes Nicholas Lose, MD    Family History Family History  Problem Relation Age of Onset  . Emphysema Mother        was a smoker  . Heart disease Mother   . Melanoma Mother        dx in her 61s  . Asthma Brother   . Breast cancer Cousin        mother's maternal cousin; dx in her 37s    Social History Social History   Tobacco Use  . Smoking status: Current Every Day Smoker    Packs/day: 0.50    Years: 24.00    Pack years: 12.00    Types: Cigarettes  . Smokeless tobacco: Never Used  Substance Use Topics  . Alcohol use: No  . Drug use: No     Allergies   Aspirin and Iodinated diagnostic agents   Review of Systems Review of Systems  Constitutional: Positive for chills. Negative for fever.  HENT: Positive for congestion, ear pain, sinus pressure and sinus pain. Negative for sore throat and voice change.   Eyes: Negative for pain, discharge, redness and itching.  Respiratory: Negative for cough and shortness of breath.   Cardiovascular: Negative for chest pain.  Gastrointestinal: Positive for nausea. Negative for abdominal pain and vomiting.  Genitourinary: Negative for dysuria, flank pain, frequency, hematuria and urgency.  Musculoskeletal: Positive for myalgias. Negative for neck pain and neck stiffness.  Skin: Negative for rash.  Neurological: Positive for light-headedness and headaches. Negative for syncope.  Psychiatric/Behavioral: Negative for confusion. The patient is not nervous/anxious.      Physical Exam Updated Vital Signs BP 101/68   Pulse 73   Temp 98.2 F (36.8 C) (Oral)   Resp 17   Ht 5\' 2"  (1.575 m)   Wt 54.4 kg (120 lb)    SpO2 99%   BMI 21.95 kg/m   Physical Exam  Constitutional: She is oriented to person, place, and time. She appears well-developed and well-nourished. She does not appear ill. No distress.  HENT:  Head: Normocephalic and atraumatic.  Right Ear: Tympanic membrane normal.  Left Ear: Tympanic membrane normal.  Nose: Nose normal.  Mouth/Throat: Uvula is midline, oropharynx is clear and moist and mucous membranes are normal.  Eyes: Conjunctivae and EOM are normal. Pupils are equal, round, and reactive to light.  Neck: Normal range of motion. Neck supple.  Cardiovascular: Normal rate and regular rhythm.  Pulmonary/Chest: Effort normal. She has no wheezes. She has no rales.  Abdominal: Soft. Bowel sounds are normal. She exhibits no mass. There is no tenderness.  Musculoskeletal: She exhibits no  edema.  Radial and pedal pulses strong, adequate circulation, good touch sensation.  Neurological: She is alert and oriented to person, place, and time. She has normal strength. No cranial nerve deficit or sensory deficit. She displays a negative Romberg sign. Gait normal.  Reflex Scores:      Bicep reflexes are 2+ on the right side and 2+ on the left side.      Brachioradialis reflexes are 2+ on the right side and 2+ on the left side.      Patellar reflexes are 2+ on the right side and 2+ on the left side.      Achilles reflexes are 2+ on the right side and 2+ on the left side. Rapid alternating movement without difficulty. Stands on one foot without difficulty.  Skin: Skin is warm and dry.  Psychiatric: She has a normal mood and affect. Her behavior is normal.  Nursing note and vitals reviewed.    ED Treatments / Results  Labs (all labs ordered are listed, but only abnormal results are displayed) Labs Reviewed  COMPREHENSIVE METABOLIC PANEL - Abnormal; Notable for the following components:      Result Value   Total Protein 6.4 (*)    All other components within normal limits  CBC WITH  DIFFERENTIAL/PLATELET    Radiology Ct Head Wo Contrast  Result Date: 07/27/2017 CLINICAL DATA:  Intermittent headaches. EXAM: CT HEAD WITHOUT CONTRAST TECHNIQUE: Contiguous axial images were obtained from the base of the skull through the vertex without intravenous contrast. COMPARISON:  None. FINDINGS: Brain: Within the anterior right frontal lobe there is a mass measuring 3.1 by 2.5 cm, image 15 of series 3. There is a large area of surrounding low attenuation edema involving much of the right frontal lobe. Low attenuation edema appears to extend into the corpus callosum and may cross the midline. There is significant mass effect with right to left midline shift measuring 8 mm. No acute intracranial hemorrhage identified.  No hydrocephalus. Vascular: No hyperdense vessel or unexpected calcification. Skull: The calvarium appears intact. Sinuses/Orbits: Paranasal sinuses and mastoid air cells are clear. Other: None IMPRESSION: 1. Large right frontal lobe mass is identified. This may represent a primary CNS neoplasm or focus of metastatic disease. There is significant surrounding low attenuation edema with mass effect and right to late midline shift measuring 8 mm. Further investigation with contrast enhanced brain MRI is advised. Critical Value/emergent results were called by telephone at the time of interpretation on 07/27/2017 at 3:28 pm to Geanie Kenning, PA, who verbally acknowledged these results. Electronically Signed   By: Kerby Moors M.D.   On: 07/27/2017 15:28    Procedures Procedures (including critical care time)  Medications Ordered in ED Medications  dexamethasone (DECADRON) injection 4 mg (4 mg Intravenous Given 07/27/17 1745)     Initial Impression / Assessment and Plan / ED Course  I have reviewed the triage vital signs and the nursing notes. Consult with neurosurgeon. Patient seen in ED by neurosurgeon and will have hospitalist admit.  Consult with hospitalist and they will  admit.   Final Clinical Impressions(s) / ED Diagnoses   Final diagnoses:  Right frontal lobe mass  Other complicated headache syndrome    ED Discharge Orders    None       Debroah Baller [place], NP 07/27/17 Ezzard Flax, MD 07/27/17 2342

## 2017-07-27 NOTE — ED Triage Notes (Signed)
Pt states she has had sinus infection and ear infection with headache X1 month. Has seen PCP for headaches and PCP was going to schedule CT scan due to persistent headaches. Pt AOX4, no neuro deficits noted.

## 2017-07-28 ENCOUNTER — Ambulatory Visit
Admit: 2017-07-28 | Discharge: 2017-07-28 | Disposition: A | Discharge status: Home / Self Care | Payer: 59 | Attending: Radiation Oncology | Admitting: Radiation Oncology

## 2017-07-28 ENCOUNTER — Inpatient Hospital Stay (HOSPITAL_COMMUNITY): Payer: 59

## 2017-07-28 ENCOUNTER — Encounter (HOSPITAL_COMMUNITY): Payer: Self-pay | Admitting: Radiology

## 2017-07-28 ENCOUNTER — Other Ambulatory Visit: Payer: Self-pay

## 2017-07-28 DIAGNOSIS — C7931 Secondary malignant neoplasm of brain: Secondary | ICD-10-CM

## 2017-07-28 LAB — CBC
HCT: 37.1 % (ref 36.0–46.0)
Hemoglobin: 12.6 g/dL (ref 12.0–15.0)
MCH: 32.5 pg (ref 26.0–34.0)
MCHC: 34 g/dL (ref 30.0–36.0)
MCV: 95.6 fL (ref 78.0–100.0)
PLATELETS: 232 10*3/uL (ref 150–400)
RBC: 3.88 MIL/uL (ref 3.87–5.11)
RDW: 13.6 % (ref 11.5–15.5)
WBC: 7.3 10*3/uL (ref 4.0–10.5)

## 2017-07-28 LAB — BASIC METABOLIC PANEL
Anion gap: 12 (ref 5–15)
BUN: 11 mg/dL (ref 6–20)
CALCIUM: 9 mg/dL (ref 8.9–10.3)
CO2: 19 mmol/L — ABNORMAL LOW (ref 22–32)
CREATININE: 0.88 mg/dL (ref 0.44–1.00)
Chloride: 108 mmol/L (ref 101–111)
GFR calc Af Amer: >60 mL/min (ref >60)
Glucose, Bld: 129 mg/dL — ABNORMAL HIGH (ref 65–99)
Potassium: 4 mmol/L (ref 3.5–5.1)
SODIUM: 139 mmol/L (ref 135–145)

## 2017-07-28 LAB — HIV ANTIBODY (ROUTINE TESTING W REFLEX): HIV SCREEN 4TH GENERATION: NONREACTIVE

## 2017-07-28 MED ORDER — IOPAMIDOL (ISOVUE-300) INJECTION 61%
INTRAVENOUS | Status: AC
  Administered 2017-07-28: 100 mL
  Filled 2017-07-28: qty 100

## 2017-07-28 MED ORDER — INFLUENZA VAC SPLIT QUAD 0.5 ML IM SUSY
0.5000 mL | PREFILLED_SYRINGE | INTRAMUSCULAR | Status: AC
  Administered 2017-07-29: 0.5 mL via INTRAMUSCULAR

## 2017-07-28 NOTE — Progress Notes (Signed)
HEMATOLOGY-ONCOLOGY PROGRESS NOTE  SUBJECTIVE: Patient is admitted with large right frontal mass measuring 3.4 cm with vasogenic edema.  She presented with severe headaches which led to the hospitalization workup.  CT chest abdomen pelvis did not show any evidence of metastatic disease elsewhere.  She has no other focal neurological deficits.  Her previous history is summarized below. She has been on oral tamoxifen after the conclusion of her neoadjuvant chemo, surgery and radiation.    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 Ki-67 was 70% HER-2 was amplified with a ratio 2.91      07/12/2012 - 07/17/2013 Neo-Adjuvant Chemotherapy    TCH 6 followed by Herceptin maintenance      12/11/2012 Surgery    Left breast lumpectomy: 1.8 cm tumor 1 positive sentinel node, axillary lymph node dissection 02/08/2013 showed 0/13 lymph nodes      03/25/2013 - 05/06/2013 Radiation Therapy    Adjuvant radiation therapy      06/05/2013 -  Anti-estrogen oral therapy    Tamoxifen 20 mg daily       OBJECTIVE: REVIEW OF SYSTEMS:   Constitutional: Denies fevers, chills or abnormal weight loss Eyes: Denies blurriness of vision Ears, nose, mouth, throat, and face: Denies mucositis or sore throat Respiratory: Denies cough, dyspnea or wheezes Cardiovascular: Denies palpitation, chest discomfort Gastrointestinal:  Denies nausea, heartburn or change in bowel habits Skin: Denies abnormal skin rashes Lymphatics: Denies new lymphadenopathy or easy bruising Neurological: Apart from intractable headaches no other symptoms of neurological deficits Behavioral/Psych: Mood is stable, no new changes  Extremities: No lower extremity edema Breast:  denies any pain or lumps or nodules in either breasts All other systems were reviewed with the patient  and are negative.  I have reviewed the past medical history, past surgical history, social history and family history with the patient and they are unchanged from previous note.   PHYSICAL EXAMINATION: ECOG PERFORMANCE STATUS: 1 - Symptomatic but completely ambulatory  Vitals:   07/28/17 0512 07/28/17 1025  BP: (!) 104/59 (!) 103/50  Pulse: 70 98  Resp: 18 18  Temp: 97.7 F (36.5 C) 98.3 F (36.8 C)  SpO2: 97% 100%   Filed Weights   07/27/17 1139 07/27/17 2119  Weight: 120 lb (54.4 kg) 113 lb 15.7 oz (51.7 kg)    GENERAL:alert, no distress and comfortable SKIN: skin color, texture, turgor are normal, no rashes or significant lesions EYES: normal, Conjunctiva are pink and non-injected, sclera clear OROPHARYNX:no exudate, no erythema and lips, buccal mucosa, and tongue normal  NECK: supple, thyroid normal size, non-tender, without nodularity LYMPH:  no palpable lymphadenopathy in the cervical, axillary or inguinal LUNGS: clear to auscultation and percussion with normal breathing effort HEART: regular rate & rhythm and no murmurs and no lower extremity edema ABDOMEN:abdomen soft, non-tender and normal bowel sounds Musculoskeletal:no cyanosis of digits and no clubbing  NEURO: alert & oriented x 3 with fluent speech, no focal motor/sensory deficits  LABORATORY DATA:  I have reviewed the data as listed CMP Latest Ref Rng & Units 07/28/2017 07/27/2017 07/27/2017  Glucose 65 - 99 mg/dL 129(H) - 89  BUN 6 - 20 mg/dL 11 - 8  Creatinine 0.44 - 1.00 mg/dL 0.88 1.17(H) 0.99  Sodium 135 - 145 mmol/L 139 - 140  Potassium 3.5 - 5.1 mmol/L 4.0 - 3.8  Chloride 101 -  111 mmol/L 108 - 106  CO2 22 - 32 mmol/L 19(L) - 24  Calcium 8.9 - 10.3 mg/dL 9.0 - 9.2  Total Protein 6.5 - 8.1 g/dL - - 6.4(L)  Total Bilirubin 0.3 - 1.2 mg/dL - - 0.4  Alkaline Phos 38 - 126 U/L - - 51  AST 15 - 41 U/L - - 20  ALT 14 - 54 U/L - - 18    Lab Results  Component Value Date   WBC 7.3 07/28/2017   HGB 12.6  07/28/2017   HCT 37.1 07/28/2017   MCV 95.6 07/28/2017   PLT 232 07/28/2017   NEUTROABS 4.0 07/27/2017    ASSESSMENT AND PLAN:  Right frontal lobe lesion measuring 3.4 cm with vasogenic edema.  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 Patient met with radiation oncology and neurosurgery and the plan is to perform stereotactic radiosurgery followed by resection. I discussed with her that once surgery is performed, we can evaluate the final pathology report and determine the next treatment plan. Potentially she may be placed on oral lapatinib which has a higher rate of CNS penetrance as maintenance therapy along with tamoxifen after the completion of SRS and surgery. I will follow her closely and will take over once she is recovered from surgery.

## 2017-07-28 NOTE — Progress Notes (Signed)
[facility]                                                                              Patient Demographics  Melissa [name], is a 52 y.o. female, DOB - 1965-12-16, ONG:[id number]  Admit date - 07/27/2017   Admitting Physician Harryette Shuart Krystal Eaton, MD  Outpatient Primary MD for the patient is [place]:   LOS - 1  days   Medical records reviewed and are as summarized below:    Chief Complaint  Patient presents with  . 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  Patient reported that for the last 1 month she was having symptoms of cold, chest congestion, sinusitis.  She was treated with antibiotic and was now taking Augmentin.  Patient was also seen in [facility] ED on 06/20/17 and was treated for migraine headache, headache improved however returned back next day.  Per patient, she saw her PCP and was going to schedule CT scan due to persistent headaches.  Patient reported that she came to ED today as she was tired of waiting.  She also reported nausea, vomiting, headache as 8/10 frontal as well as bilateral temporal and sometimes radiating to the back of the head.  Patient initially thought that she was having sinus headache or migraine headaches.  Assessment & Plan     Frontal mass of brain with surrounding edema could be primary CNS neoplasm versus metastatic disease due to history of breast cancer -Headache improving, continue IV Decadron -Neurosurgery consulted, appreciate recommendations, neurosurgery has  discussed with radiation oncology -MRI of the brain showed 3.4x 2.9x 2.9 right frontal lobe mass with imaging characteristics of solitary metastasis with extensive vasogenic edema and midline shift -Consulted Dr. Lindi Adie (oncology), will evaluate patient for further recommendations -CT chest, abdomen, pelvis showed no metastatic breast cancer involving chest abdomen, pelvis or osseous structures, stable emphysematous changes.  No local recurrence or regional adenopathy.  Active Problems:   Breast cancer of upper-inner quadrant of left female breast ([place]) - history of invasive ductal carcinoma, ER positive, PR positive, diagnosed in 05/2012, underwent neoadjuvant chemotherapy 07/2012-07/2013, left breast lumpectomy in June 2014, radiation therapy in 2014, currently on tamoxifen  -Follows Dr. Lindi Adie, last visit in 08/2016, per patient, she was doing well and was recommended to return to the clinic in 1 year follow-up     Nicotine abuse -Smoking cessation encouraged, placed on nicotine patch    Migraines -Headaches likely due to large frontal mass, started on IV steroids.  Follow closely regarding the migraines.  Code Status: Full CODE STATUS DVT Prophylaxis: Heparin subcu Family Communication: Discussed in detail with the patient, all imaging results, lab results explained to the patient   Disposition Plan:  Time Spent in minutes  25 minutes  Procedures:  MRI brain, CT chest abdomen and pelvis  Consultants:   Neurosurgery Oncology  Antimicrobials:      Medications  Scheduled Meds: . dexamethasone  4 mg Intravenous Q6H  . famotidine  20 mg Oral QHS  . heparin  5,000 Units Subcutaneous Q8H  . [START ON 07/29/2017] Influenza vac split quadrivalent PF  0.5 mL Intramuscular Tomorrow-1000  . mometasone-formoterol  2 puff Inhalation BID  . nicotine  21 mg Transdermal Daily  . tamoxifen  20 mg Oral QHS   Continuous Infusions: . sodium chloride 75 mL/hr at 07/28/17 1143   PRN  Meds:.acetaminophen **OR** acetaminophen, albuterol, HYDROcodone-acetaminophen, ondansetron **OR** ondansetron (ZOFRAN) IV   Antibiotics   Anti-infectives (From admission, onward)   None        Subjective:   Melissa [name] was seen and examined today.  Headache improving, 2-3/10, no nausea vomiting.  Patient denies dizziness, chest pain, shortness of breath, abdominal pain, N/V/D/C, new weakness, numbess, tingling. No fevers.  Objective:   Vitals:   07/27/17 2119 07/28/17 0050 07/28/17 0512 07/28/17 1025  BP: 104/63 (!) 100/57 (!) 104/59 (!) 103/50  Pulse: 65 83 70 98  Resp: 18 18 18 18   Temp: 98.5 F (36.9 C) 98.3 F (36.8 C) 97.7 F (36.5 C) 98.3 F (36.8 C)  TempSrc: Oral Oral Oral Oral  SpO2: 99% 97% 97% 100%  Weight: 51.7 kg (113 lb 15.7 oz)     Height: 5\' 2"  (1.575 m)       Intake/Output Summary (Last 24 hours) at 07/28/2017 1316 Last data filed at 07/28/2017 0623 Gross per 24 hour  Intake 757.5 ml  Output -  Net 757.5 ml     Wt Readings from Last 3 Encounters:  07/27/17 51.7 kg (113 lb 15.7 oz)  06/20/17 54 kg (119 lb)  06/16/17 54 kg (119 lb)     Exam  General: Alert and oriented x 3, NAD  Eyes:  HEENT:  Atraumatic, normocephalic, normal oropharynx  Cardiovascular: S1 S2 auscultated, no rubs, murmurs or gallops. Regular rate and rhythm.  Respiratory: Clear to auscultation bilaterally, no wheezing, rales or rhonchi  Gastrointestinal: Soft, nontender, nondistended, + bowel sounds  Ext: no pedal edema bilaterally  Neuro: no new deficits  Musculoskeletal: No digital cyanosis, clubbing  Skin: No rashes  Psych: Normal affect and demeanor, alert and oriented x3    Data Reviewed:  I have personally reviewed following labs and imaging studies  Micro Results No results found for this or any previous visit (from the past 240 hour(s)).  Radiology Reports Ct Head Wo Contrast  Result Date: 07/27/2017 CLINICAL DATA:  Intermittent headaches. EXAM:  CT HEAD WITHOUT CONTRAST TECHNIQUE: Contiguous axial images were obtained from the base of the skull through the vertex without intravenous contrast. COMPARISON:  None. FINDINGS: Brain: Within the anterior right frontal lobe there is a mass measuring 3.1 by 2.5 cm, image 15 of series 3. There is a large area of surrounding low attenuation edema involving much of the right frontal lobe. Low attenuation edema appears to extend into the corpus callosum and may cross the midline. There is significant mass effect with right to left midline shift measuring 8 mm. No acute intracranial hemorrhage identified.  No hydrocephalus. Vascular: No hyperdense vessel or unexpected calcification. Skull: The calvarium appears intact. Sinuses/Orbits: Paranasal sinuses and mastoid air cells are clear. Other: None IMPRESSION: 1. Large right frontal lobe mass is identified. This may represent a primary CNS neoplasm  or focus of metastatic disease. There is significant surrounding low attenuation edema with mass effect and right to late midline shift measuring 8 mm. Further investigation with contrast enhanced brain MRI is advised. Critical Value/emergent results were called by telephone at the time of interpretation on 07/27/2017 at 3:28 pm to Geanie Kenning, PA, who verbally acknowledged these results. Electronically Signed   By: Kerby Moors M.D.   On: 07/27/2017 15:28   Ct Chest W Contrast  Result Date: 07/28/2017 CLINICAL DATA:  Restaging breast cancer. Brain mass seen on MRI examination yesterday. EXAM: CT CHEST, ABDOMEN, AND PELVIS WITH CONTRAST TECHNIQUE: Multidetector CT imaging of the chest, abdomen and pelvis was performed following the standard protocol during bolus administration of intravenous contrast. CONTRAST:  146mL ISOVUE-300 IOPAMIDOL (ISOVUE-300) INJECTION 61% COMPARISON:  CT chest 07/19/2016 and CT abdomen/pelvis 07/18/2017 FINDINGS: CT CHEST FINDINGS Cardiovascular: The heart is normal in size and stable. No  pericardial effusion. The aorta and branch vessels are patent. No dissection. Mediastinum/Nodes: No mediastinal or hilar mass or lymphadenopathy. The esophagus is grossly normal. Lungs/Pleura: Surgical changes from a prior left upper lobe lobectomy. There also radiation changes involving the anterior aspect of the left lung. No worrisome pulmonary nodules to suggest pulmonary metastatic disease. Stable emphysematous changes and areas of bronchiectasis. Left upper lobe bronchiectasis with fluid-filled bronchi are noted. No pleural effusion. No pleural lesions. Musculoskeletal: Surgical changes involving the left breast and left axilla. No findings suspicious for recurrent tumor. No supraclavicular or axillary lymphadenopathy. No worrisome lytic or sclerotic bone lesions to suggest metastatic disease. CT ABDOMEN PELVIS FINDINGS Hepatobiliary: No hepatic lesions to suggest metastasis. The gallbladder demonstrates layering high attenuation material, likely sludge, stones or both. No common bile duct dilatation. Pancreas: No mass, inflammation or ductal dilatation. Spleen: Normal size.  No focal lesions. Adrenals/Urinary Tract: The adrenal glands and kidneys are unremarkable. No findings for metastatic disease. No hydronephrosis. The bladder is normal. Stomach/Bowel: The stomach, duodenum, small bowel and colon are grossly normal without oral contrast. No inflammatory changes, mass lesions or obstructive findings. The appendix is normal. Vascular/Lymphatic: The aorta and branch vessels are patent. The major venous structures are patent. Circumaortic left renal vein noted. No mesenteric or retroperitoneal mass or adenopathy. Reproductive: The uterus and ovaries are unremarkable. The uterus is retroverted. Other: No pelvic mass or adenopathy. No free pelvic fluid collections. No inguinal mass or adenopathy. No abdominal wall hernia or subcutaneous lesions. Musculoskeletal: No worrisome bone lesions to suggest metastatic  disease. IMPRESSION: 1. No findings to suggest metastatic breast cancer involving the chest, abdomen, pelvis or osseous structures. 2. Stable emphysematous changes and areas of bronchiectasis. 3. Stable surgical changes from a left upper lobe lobectomy. 4. Surgical changes involving the left breast and left axilla but no findings for local recurrence or regional adenopathy. Electronically Signed   By: Marijo Sanes M.D.   On: 07/28/2017 11:39   Mr Jeri Cos NI Contrast  Result Date: 07/27/2017 CLINICAL DATA:  Headaches. Follow-up RIGHT frontal lobe mass. History of breast cancer, nicotine abuse, valley fever. EXAM: MRI HEAD WITHOUT AND WITH CONTRAST TECHNIQUE: Multiplanar, multiecho pulse sequences of the brain and surrounding structures were obtained without and with intravenous contrast. CONTRAST:  60mL MULTIHANCE GADOBENATE DIMEGLUMINE 529 MG/ML IV SOLN COMPARISON:  CT HEAD July 27, 2017 at 1518 hours. FINDINGS: INTRACRANIAL CONTENTS: 3.4 x 2.9 x 2.9 cm (transverse by AP by CC) solidly RIGHT frontal lobe mass at gray-white matter junction, central necrosis. Marked surrounding vasogenic edema extending through the genu of  the corpus callosum. Minimal susceptibility artifact along the inferior margin. 9 mm RIGHT to LEFT midline shift with mass effect on RIGHT basal ganglia and frontal horn of lateral ventricle. Minimal LEFT occipital horn periventricular FLAIR T2 hyperintense signal clavicle for interstitial edema. No associated reduced diffusion to suggest hypercellular tumor. No satellite foci of abnormal enhancement or signal. A few scattered subcentimeter supratentorial white matter FLAIR T2 hyperintensities most compatible with mild chronic small vessel ischemic disease. No abnormal extra-axial fluid collections or extra-axial enhancement. VASCULAR: Normal major intracranial vascular flow voids present at skull base. SKULL AND UPPER CERVICAL SPINE: No abnormal sellar expansion. No suspicious calvarial  bone marrow signal. Craniocervical junction maintained. SINUSES/ORBITS: The mastoid air-cells and included paranasal sinuses are well-aerated.The included ocular globes and orbital contents are non-suspicious. OTHER: None. IMPRESSION: 1. 3.4 x 2.9 x 2.9 cm RIGHT frontal lobe mass with imaging characteristics of solitary metastasis. Extensive vasogenic edema resulting in 9 mm RIGHT to LEFT midline shift. Equivocal very early LEFT ventricle entrapment. Electronically Signed   By: Elon Alas M.D.   On: 07/27/2017 21:22   Ct Abdomen Pelvis W Contrast  Result Date: 07/28/2017 CLINICAL DATA:  Restaging breast cancer. Brain mass seen on MRI examination yesterday. EXAM: CT CHEST, ABDOMEN, AND PELVIS WITH CONTRAST TECHNIQUE: Multidetector CT imaging of the chest, abdomen and pelvis was performed following the standard protocol during bolus administration of intravenous contrast. CONTRAST:  156mL ISOVUE-300 IOPAMIDOL (ISOVUE-300) INJECTION 61% COMPARISON:  CT chest 07/19/2016 and CT abdomen/pelvis 07/18/2017 FINDINGS: CT CHEST FINDINGS Cardiovascular: The heart is normal in size and stable. No pericardial effusion. The aorta and branch vessels are patent. No dissection. Mediastinum/Nodes: No mediastinal or hilar mass or lymphadenopathy. The esophagus is grossly normal. Lungs/Pleura: Surgical changes from a prior left upper lobe lobectomy. There also radiation changes involving the anterior aspect of the left lung. No worrisome pulmonary nodules to suggest pulmonary metastatic disease. Stable emphysematous changes and areas of bronchiectasis. Left upper lobe bronchiectasis with fluid-filled bronchi are noted. No pleural effusion. No pleural lesions. Musculoskeletal: Surgical changes involving the left breast and left axilla. No findings suspicious for recurrent tumor. No supraclavicular or axillary lymphadenopathy. No worrisome lytic or sclerotic bone lesions to suggest metastatic disease. CT ABDOMEN PELVIS FINDINGS  Hepatobiliary: No hepatic lesions to suggest metastasis. The gallbladder demonstrates layering high attenuation material, likely sludge, stones or both. No common bile duct dilatation. Pancreas: No mass, inflammation or ductal dilatation. Spleen: Normal size.  No focal lesions. Adrenals/Urinary Tract: The adrenal glands and kidneys are unremarkable. No findings for metastatic disease. No hydronephrosis. The bladder is normal. Stomach/Bowel: The stomach, duodenum, small bowel and colon are grossly normal without oral contrast. No inflammatory changes, mass lesions or obstructive findings. The appendix is normal. Vascular/Lymphatic: The aorta and branch vessels are patent. The major venous structures are patent. Circumaortic left renal vein noted. No mesenteric or retroperitoneal mass or adenopathy. Reproductive: The uterus and ovaries are unremarkable. The uterus is retroverted. Other: No pelvic mass or adenopathy. No free pelvic fluid collections. No inguinal mass or adenopathy. No abdominal wall hernia or subcutaneous lesions. Musculoskeletal: No worrisome bone lesions to suggest metastatic disease. IMPRESSION: 1. No findings to suggest metastatic breast cancer involving the chest, abdomen, pelvis or osseous structures. 2. Stable emphysematous changes and areas of bronchiectasis. 3. Stable surgical changes from a left upper lobe lobectomy. 4. Surgical changes involving the left breast and left axilla but no findings for local recurrence or regional adenopathy. Electronically Signed   By: Marijo Sanes  M.D.   On: 07/28/2017 11:39    Lab Data:  CBC: Recent Labs  Lab 07/27/17 1619 07/27/17 2256 07/28/17 0607  WBC 8.0 9.7 7.3  NEUTROABS 4.0  --   --   HGB 12.8 12.6 12.6  HCT 37.3 37.3 37.1  MCV 95.4 95.9 95.6  PLT 226 234 767   Basic Metabolic Panel: Recent Labs  Lab 07/27/17 1619 07/27/17 2256 07/28/17 0607  NA 140  --  139  K 3.8  --  4.0  CL 106  --  108  CO2 24  --  19*  GLUCOSE 89  --   129*  BUN 8  --  11  CREATININE 0.99 1.17* 0.88  CALCIUM 9.2  --  9.0   GFR: Estimated Creatinine Clearance: 61.2 mL/min (by C-G formula based on SCr of 0.88 mg/dL). Liver Function Tests: Recent Labs  Lab 07/27/17 1619  AST 20  ALT 18  ALKPHOS 51  BILITOT 0.4  PROT 6.4*  ALBUMIN 3.6   No results for input(s): LIPASE, AMYLASE in the last 168 hours. No results for input(s): AMMONIA in the last 168 hours. Coagulation Profile: No results for input(s): INR, PROTIME in the last 168 hours. Cardiac Enzymes: No results for input(s): CKTOTAL, CKMB, CKMBINDEX, TROPONINI in the last 168 hours. BNP (last 3 results) No results for input(s): PROBNP in the last 8760 hours. HbA1C: No results for input(s): HGBA1C in the last 72 hours. CBG: No results for input(s): GLUCAP in the last 168 hours. Lipid Profile: No results for input(s): CHOL, HDL, LDLCALC, TRIG, CHOLHDL, LDLDIRECT in the last 72 hours. Thyroid Function Tests: No results for input(s): TSH, T4TOTAL, FREET4, T3FREE, THYROIDAB in the last 72 hours. Anemia Panel: No results for input(s): VITAMINB12, FOLATE, FERRITIN, TIBC, IRON, RETICCTPCT in the last 72 hours. Urine analysis:    Component Value Date/Time   COLORURINE YELLOW 07/18/2016 1515   APPEARANCEUR HAZY (A) 07/18/2016 1515   LABSPEC 1.019 07/18/2016 1515   PHURINE 5.0 07/18/2016 1515   GLUCOSEU NEGATIVE 07/18/2016 1515   HGBUR NEGATIVE 07/18/2016 1515   BILIRUBINUR NEGATIVE 07/18/2016 1515   KETONESUR NEGATIVE 07/18/2016 1515   PROTEINUR NEGATIVE 07/18/2016 1515   UROBILINOGEN 0.2 04/21/2007 1141   NITRITE NEGATIVE 07/18/2016 1515   LEUKOCYTESUR SMALL (A) 07/18/2016 1515     Cassandre Oleksy M.D. [facility] 07/28/2017, 1:16 PM  Pager: [phone] Between 7am to 7pm - call Pager - [phone]  After 7pm go to [email or web address] - password [email or web address]  Call night coverage person covering after 7pm

## 2017-07-28 NOTE — Consult Note (Signed)
Radiation Oncology         [phone] ________________________________  Initial inpatient Consultation  Name: Melissa [name] MRN: [id number]  Date of Service: 07/27/2017 DOB: 1966/04/23  CC:[facility], [facility]  No ref. provider found   REFERRING PHYSICIAN: No ref. provider found  DIAGNOSIS: The primary encounter diagnosis was Right frontal lobe mass. A diagnosis of Other complicated headache syndrome was also pertinent to this visit.    ICD-10-CM   1. Right frontal lobe mass G93.9   2. Other complicated headache syndrome G44.59     HISTORY OF PRESENT ILLNESS: Melissa [name] is a 52 y.o. female seen at the request of Dr. Cyndy Freeze. 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  She was treated with neoadjuvant chemotherapy followed by left breast lumpectomy followed by adjuvant radiotherapy (Dr. Valere Dross) in 2014.  She has been maintained on tamoxifen since that time and has shown no evidence for disease recurrence. She is followed in medical oncology with Dr. Lindi Adie.   She recently presented to the emergency Department on 07/27/2017 with complaints of headaches ongoing for the past 5 weeks with associated nausea and vomiting. She was initially treated by her PCP for sinusitis without improvement.  She also has a history of migraine headaches and presented to the ED at [facility] on 06/20/2017 for treatment of what she thought was a typical migraine headache given the fact that she had her usual blurry vision and aura preceding the headache. She reports that the headache did improve with treatment but the relief was short-lived as the headache returned the very next day. She followed up with her primary care physician and a CT of the head was going to be scheduled due to the persistent headaches but in the interim, she presented to  the Emergency Department at [facility] due to increased severity of the headache with associated nausea and vomiting. The headaches occur in the frontal lobe as well as bilateral temporal with occasional radiation to the occipital lobe. The headaches are associated with blurry vision, decreased appetite, fatigue, nausea and vomiting.  She denies any difficulty with speech, memory, imbalance or focal weakness.  CT Head was performed on admission 07/27/2017 to investigate the persistent headaches. This scan showed a large right frontal lobe mass measuring 3.1 by 2.5 cm which was felt to potentially represent a primary CNS neoplasm or focus of metastatic disease. There is significant surrounding low attenuation edema with mass effect and right to left midline shift measuring 8 mm. Subsequent Brain MRI showed a 3.4 x 2.9 x 2.9 cm right frontal lobe mass with imaging characteristics of solitary metastasis and extensive vasogenic edema resulting in 9 mm right to left midline shift as well as equivocal very early left ventricle entrapment.    She was started on IV Decadron 15m q6 hours and this has given her significant symptom relief from her headaches, N/V.    CT Chest, Abdomen, Pelvis on 07/28/2017 showed no findings to suggest metastatic breast cancer involving the chest, abdomen, pelvis or osseous structures. 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 Surgical changes involving the left breast and left axilla but no findings for local recurrence or regional adenopathy.  We are asked to meet with her today to discuss the potential role of radiotherapy in the treatment of  her solitary metastatic disease.  PREVIOUS RADIATION THERAPY: Yes  03/25/2013 -05/06/2013:  Left breast 5000 cGy in 25 sessions, left breast boost 1000 cGy 5 sessions                       PAST MEDICAL HISTORY:  Past Medical History:  Diagnosis Date  . Arthritis    knees and hips  .  Asthma   . Bronchiolitis   . Cancer [facility])    breast cancer 2014  . H/O coccidioidomycosis    was reason for lung lobectomy  . Headache(784.0)    due to eye strain or not eating  . History of anemia    no current problem  . History of asthma    as a child  . History of breast cancer 2014   left  . History of chemotherapy    finished 07/17/2013  . Hx of radiation therapy 03/25/13-05/06/13   left breast 5000 cGy/25 sessions, left breast boost 1000 cGy/5 sessions  . Runny nose 07/30/2013   clear drainage  . Wears dentures    upper      PAST SURGICAL HISTORY: Past Surgical History:  Procedure Laterality Date  . AXILLARY LYMPH NODE DISSECTION Left 02/08/2013   Procedure: LEFT AXILLARY DISSECTION;  Surgeon: Edward Jolly, MD;  Location: [place];  Service: General;  Laterality: Left;  . BREAST CYST EXCISION Right 2006  . BREAST LUMPECTOMY WITH NEEDLE LOCALIZATION AND AXILLARY SENTINEL LYMPH NODE BX Left 12/31/2012   Procedure: NEEDLE LOCALIZATION LEFT BREAST LUMPECTOMY AND LEFT AXILLARY SENTENIAL LYMPH NODE BX;  Surgeon: Edward Jolly, MD;  Location: [place];  Service: General;  Laterality: Left;  . CESAREAN SECTION  1995/1996  . LUNG LOBECTOMY Left 05/1996   upper lobe - due to [facility] Fever  . PORT-A-CATH REMOVAL Right 08/02/2013   Procedure: REMOVAL PORT-A-CATH;  Surgeon: Edward Jolly, MD;  Location: [place];  Service: General;  Laterality: Right;  . PORTACATH PLACEMENT  07/02/2012   Procedure: INSERTION PORT-A-CATH;  Surgeon: Edward Jolly, MD;  Location: [place];  Service: General;  Laterality: N/A;  right    FAMILY HISTORY:  Family History  Problem Relation Age of Onset  . Emphysema Mother        was a smoker  . Heart disease Mother   . Melanoma Mother        dx in her 64s  . Asthma Brother   . Breast cancer Cousin        mother's maternal cousin; dx in her 74s    SOCIAL  HISTORY:  Social History   Socioeconomic History  . Marital status: Married    Spouse name: Not on file  . Number of children: 2  . Years of education: Not on file  . Highest education level: Not on file  Social Needs  . Financial resource strain: Not on file  . Food insecurity - worry: Not on file  . Food insecurity - inability: Not on file  . Transportation needs - medical: Not on file  . Transportation needs - non-medical: Not on file  Occupational History  . Occupation: Secondary school teacher  Tobacco Use  . Smoking status: Current Every Day Smoker    Packs/day: 0.50    Years: 24.00    Pack years: 12.00    Types: Cigarettes  . Smokeless tobacco: Never Used  Substance and Sexual Activity  . Alcohol use: No  .  Drug use: No  . Sexual activity: Yes    Comment: trying to quit  Other Topics Concern  . Not on file  Social History Narrative  . Not on file    ALLERGIES: Aspirin and Iodinated diagnostic agents  MEDICATIONS:  Current Facility-Administered Medications  Medication Dose Route Frequency Provider Last Rate Last Dose  . 0.9 %  sodium chloride infusion   Intravenous Continuous Rai, Ripudeep K, MD 75 mL/hr at 07/28/17 1143    . acetaminophen (TYLENOL) tablet 650 mg  650 mg Oral Q6H PRN Rai, Ripudeep K, MD       Or  . acetaminophen (TYLENOL) suppository 650 mg  650 mg Rectal Q6H PRN Rai, Ripudeep K, MD      . albuterol (PROVENTIL) (2.5 MG/3ML) 0.083% nebulizer solution 3 mL  3 mL Inhalation Q6H PRN Rai, Ripudeep K, MD      . dexamethasone (DECADRON) injection 4 mg  4 mg Intravenous Q6H Costella, Vincent J, PA-C   4 mg at 07/28/17 0549  . famotidine (PEPCID) tablet 20 mg  20 mg Oral QHS Rai, Ripudeep K, MD   20 mg at 07/27/17 2142  . heparin injection 5,000 Units  5,000 Units Subcutaneous Q8H Rai, Ripudeep K, MD   5,000 Units at 07/28/17 0548  . HYDROcodone-acetaminophen (NORCO/VICODIN) 5-325 MG per tablet 1-2 tablet  1-2 tablet Oral Q4H PRN Rai, Ripudeep K, MD      .  Derrill Memo ON 07/29/2017] Influenza vac split quadrivalent PF (FLUARIX) injection 0.5 mL  0.5 mL Intramuscular Tomorrow-1000 Rai, Ripudeep K, MD      . mometasone-formoterol (DULERA) 200-5 MCG/ACT inhaler 2 puff  2 puff Inhalation BID Rai, Ripudeep K, MD   2 puff at 07/28/17 0911  . nicotine (NICODERM CQ - dosed in mg/24 hours) patch 21 mg  21 mg Transdermal Daily Rai, Ripudeep K, MD   21 mg at 07/28/17 1142  . ondansetron (ZOFRAN) tablet 4 mg  4 mg Oral Q6H PRN Rai, Ripudeep K, MD       Or  . ondansetron (ZOFRAN) injection 4 mg  4 mg Intravenous Q6H PRN Rai, Ripudeep K, MD      . tamoxifen (NOLVADEX) tablet 20 mg  20 mg Oral QHS Rai, Ripudeep K, MD   20 mg at 07/27/17 2141    REVIEW OF SYSTEMS:  On review of systems, the patient reports that she is doing well overall. She has had significant improvement in her headache since starting Decadron.  Her pain is currently very well controlled.  Her blurry vision has improved since starting steroids as well.  She denies any difficulty with speech, memory, imbalance or focal weakness. She denies any chest pain, increased shortness of breath, increased cough, fevers, chills, night sweats, or unintended weight changes. She denies any bowel or bladder disturbances, and denies abdominal pain, nausea or vomiting. She denies any new musculoskeletal or joint aches or pains. A complete review of systems is obtained and is otherwise negative.    PHYSICAL EXAM:  Wt Readings from Last 3 Encounters:  07/27/17 113 lb 15.7 oz (51.7 kg)  06/20/17 119 lb (54 kg)  06/16/17 119 lb (54 kg)   Temp Readings from Last 3 Encounters:  07/28/17 98.3 F (36.8 C) (Oral)  06/20/17 98.2 F (36.8 C) (Oral)  08/11/16 97.8 F (36.6 C) (Oral)   BP Readings from Last 3 Encounters:  07/28/17 (!) 103/50  06/20/17 98/62  06/16/17 110/62   Pulse Readings from Last 3 Encounters:  07/28/17 98  06/20/17 72  06/16/17 (!) 103   Pain Assessment Pain Score: 4 /10  In general this is  a well appearing caucasian female in no acute distress. She is alert and oriented x4 and appropriate throughout the examination. HEENT reveals that the patient is normocephalic, atraumatic. EOMs are intact. PERRLA. Skin is intact without any evidence of gross lesions. Cardiovascular exam reveals a regular rate and rhythm, no clicks rubs or murmurs are auscultated. Chest is clear to auscultation bilaterally. Lymphatic assessment is performed and does not reveal any adenopathy in the cervical, supraclavicular, axillary, or inguinal chains. Abdomen has active bowel sounds in all quadrants and is intact. The abdomen is soft, non tender, non distended. Lower extremities are negative for pretibial pitting edema, deep calf tenderness, cyanosis or clubbing.  KPS = 90  100 - Normal; no complaints; no evidence of disease. 90   - Able to carry on normal activity; minor signs or symptoms of disease. 80   - Normal activity with effort; some signs or symptoms of disease. 5   - Cares for self; unable to carry on normal activity or to do active work. 60   - Requires occasional assistance, but is able to care for most of his personal needs. 50   - Requires considerable assistance and frequent medical care. 71   - Disabled; requires special care and assistance. 13   - Severely disabled; hospital admission is indicated although death not imminent. 63   - Very sick; hospital admission necessary; active supportive treatment necessary. 10   - Moribund; fatal processes progressing rapidly. 0     - Dead  Karnofsky DA, Abelmann Galva, Craver LS and Burchenal JH [phone]) The use of the nitrogen mustards in the palliative treatment of carcinoma: with particular reference to bronchogenic carcinoma Cancer 1 634-56  LABORATORY DATA:  Lab Results  Component Value Date   WBC 7.3 07/28/2017   HGB 12.6 07/28/2017   HCT 37.1 07/28/2017   MCV 95.6 07/28/2017   PLT 232 07/28/2017   Lab Results  Component Value Date   NA 139  07/28/2017   K 4.0 07/28/2017   CL 108 07/28/2017   CO2 19 (L) 07/28/2017   Lab Results  Component Value Date   ALT 18 07/27/2017   AST 20 07/27/2017   ALKPHOS 51 07/27/2017   BILITOT 0.4 07/27/2017     RADIOGRAPHY: Ct Head Wo Contrast  Result Date: 07/27/2017 CLINICAL DATA:  Intermittent headaches. EXAM: CT HEAD WITHOUT CONTRAST TECHNIQUE: Contiguous axial images were obtained from the base of the skull through the vertex without intravenous contrast. COMPARISON:  None. FINDINGS: Brain: Within the anterior right frontal lobe there is a mass measuring 3.1 by 2.5 cm, image 15 of series 3. There is a large area of surrounding low attenuation edema involving much of the right frontal lobe. Low attenuation edema appears to extend into the corpus callosum and may cross the midline. There is significant mass effect with right to left midline shift measuring 8 mm. No acute intracranial hemorrhage identified.  No hydrocephalus. Vascular: No hyperdense vessel or unexpected calcification. Skull: The calvarium appears intact. Sinuses/Orbits: Paranasal sinuses and mastoid air cells are clear. Other: None IMPRESSION: 1. Large right frontal lobe mass is identified. This may represent a primary CNS neoplasm or focus of metastatic disease. There is significant surrounding low attenuation edema with mass effect and right to late midline shift measuring 8 mm. Further investigation with contrast enhanced brain MRI is advised. Critical Value/emergent results were called by  telephone at the time of interpretation on 07/27/2017 at 3:28 pm to Geanie Kenning, PA, who verbally acknowledged these results. Electronically Signed   By: Kerby Moors M.D.   On: 07/27/2017 15:28   Ct Chest W Contrast  Result Date: 07/28/2017 CLINICAL DATA:  Restaging breast cancer. Brain mass seen on MRI examination yesterday. EXAM: CT CHEST, ABDOMEN, AND PELVIS WITH CONTRAST TECHNIQUE: Multidetector CT imaging of the chest, abdomen and pelvis  was performed following the standard protocol during bolus administration of intravenous contrast. CONTRAST:  142m ISOVUE-300 IOPAMIDOL (ISOVUE-300) INJECTION 61% COMPARISON:  CT chest 07/19/2016 and CT abdomen/pelvis 07/18/2017 FINDINGS: CT CHEST FINDINGS Cardiovascular: The heart is normal in size and stable. No pericardial effusion. The aorta and branch vessels are patent. No dissection. Mediastinum/Nodes: No mediastinal or hilar mass or lymphadenopathy. The esophagus is grossly normal. Lungs/Pleura: Surgical changes from a prior left upper lobe lobectomy. There also radiation changes involving the anterior aspect of the left lung. No worrisome pulmonary nodules to suggest pulmonary metastatic disease. Stable emphysematous changes and areas of bronchiectasis. Left upper lobe bronchiectasis with fluid-filled bronchi are noted. No pleural effusion. No pleural lesions. Musculoskeletal: Surgical changes involving the left breast and left axilla. No findings suspicious for recurrent tumor. No supraclavicular or axillary lymphadenopathy. No worrisome lytic or sclerotic bone lesions to suggest metastatic disease. CT ABDOMEN PELVIS FINDINGS Hepatobiliary: No hepatic lesions to suggest metastasis. The gallbladder demonstrates layering high attenuation material, likely sludge, stones or both. No common bile duct dilatation. Pancreas: No mass, inflammation or ductal dilatation. Spleen: Normal size.  No focal lesions. Adrenals/Urinary Tract: The adrenal glands and kidneys are unremarkable. No findings for metastatic disease. No hydronephrosis. The bladder is normal. Stomach/Bowel: The stomach, duodenum, small bowel and colon are grossly normal without oral contrast. No inflammatory changes, mass lesions or obstructive findings. The appendix is normal. Vascular/Lymphatic: The aorta and branch vessels are patent. The major venous structures are patent. Circumaortic left renal vein noted. No mesenteric or retroperitoneal mass  or adenopathy. Reproductive: The uterus and ovaries are unremarkable. The uterus is retroverted. Other: No pelvic mass or adenopathy. No free pelvic fluid collections. No inguinal mass or adenopathy. No abdominal wall hernia or subcutaneous lesions. Musculoskeletal: No worrisome bone lesions to suggest metastatic disease. IMPRESSION: 1. No findings to suggest metastatic breast cancer involving the chest, abdomen, pelvis or osseous structures. 2. Stable emphysematous changes and areas of bronchiectasis. 3. Stable surgical changes from a left upper lobe lobectomy. 4. Surgical changes involving the left breast and left axilla but no findings for local recurrence or regional adenopathy. Electronically Signed   By: PMarijo SanesM.D.   On: 07/28/2017 11:39   Mr BJeri CosWUQContrast  Result Date: 07/27/2017 CLINICAL DATA:  Headaches. Follow-up RIGHT frontal lobe mass. History of breast cancer, nicotine abuse, valley fever. EXAM: MRI HEAD WITHOUT AND WITH CONTRAST TECHNIQUE: Multiplanar, multiecho pulse sequences of the brain and surrounding structures were obtained without and with intravenous contrast. CONTRAST:  193mMULTIHANCE GADOBENATE DIMEGLUMINE 529 MG/ML IV SOLN COMPARISON:  CT HEAD July 27, 2017 at 1518 hours. FINDINGS: INTRACRANIAL CONTENTS: 3.4 x 2.9 x 2.9 cm (transverse by AP by CC) solidly RIGHT frontal lobe mass at gray-white matter junction, central necrosis. Marked surrounding vasogenic edema extending through the genu of the corpus callosum. Minimal susceptibility artifact along the inferior margin. 9 mm RIGHT to LEFT midline shift with mass effect on RIGHT basal ganglia and frontal horn of lateral ventricle. Minimal LEFT occipital horn periventricular FLAIR T2 hyperintense signal  clavicle for interstitial edema. No associated reduced diffusion to suggest hypercellular tumor. No satellite foci of abnormal enhancement or signal. A few scattered subcentimeter supratentorial white matter FLAIR T2  hyperintensities most compatible with mild chronic small vessel ischemic disease. No abnormal extra-axial fluid collections or extra-axial enhancement. VASCULAR: Normal major intracranial vascular flow voids present at skull base. SKULL AND UPPER CERVICAL SPINE: No abnormal sellar expansion. No suspicious calvarial bone marrow signal. Craniocervical junction maintained. SINUSES/ORBITS: The mastoid air-cells and included paranasal sinuses are well-aerated.The included ocular globes and orbital contents are non-suspicious. OTHER: None. IMPRESSION: 1. 3.4 x 2.9 x 2.9 cm RIGHT frontal lobe mass with imaging characteristics of solitary metastasis. Extensive vasogenic edema resulting in 9 mm RIGHT to LEFT midline shift. Equivocal very early LEFT ventricle entrapment. Electronically Signed   By: Elon Alas M.D.   On: 07/27/2017 21:22   Ct Abdomen Pelvis W Contrast  Result Date: 07/28/2017 CLINICAL DATA:  Restaging breast cancer. Brain mass seen on MRI examination yesterday. EXAM: CT CHEST, ABDOMEN, AND PELVIS WITH CONTRAST TECHNIQUE: Multidetector CT imaging of the chest, abdomen and pelvis was performed following the standard protocol during bolus administration of intravenous contrast. CONTRAST:  14m ISOVUE-300 IOPAMIDOL (ISOVUE-300) INJECTION 61% COMPARISON:  CT chest 07/19/2016 and CT abdomen/pelvis 07/18/2017 FINDINGS: CT CHEST FINDINGS Cardiovascular: The heart is normal in size and stable. No pericardial effusion. The aorta and branch vessels are patent. No dissection. Mediastinum/Nodes: No mediastinal or hilar mass or lymphadenopathy. The esophagus is grossly normal. Lungs/Pleura: Surgical changes from a prior left upper lobe lobectomy. There also radiation changes involving the anterior aspect of the left lung. No worrisome pulmonary nodules to suggest pulmonary metastatic disease. Stable emphysematous changes and areas of bronchiectasis. Left upper lobe bronchiectasis with fluid-filled bronchi are  noted. No pleural effusion. No pleural lesions. Musculoskeletal: Surgical changes involving the left breast and left axilla. No findings suspicious for recurrent tumor. No supraclavicular or axillary lymphadenopathy. No worrisome lytic or sclerotic bone lesions to suggest metastatic disease. CT ABDOMEN PELVIS FINDINGS Hepatobiliary: No hepatic lesions to suggest metastasis. The gallbladder demonstrates layering high attenuation material, likely sludge, stones or both. No common bile duct dilatation. Pancreas: No mass, inflammation or ductal dilatation. Spleen: Normal size.  No focal lesions. Adrenals/Urinary Tract: The adrenal glands and kidneys are unremarkable. No findings for metastatic disease. No hydronephrosis. The bladder is normal. Stomach/Bowel: The stomach, duodenum, small bowel and colon are grossly normal without oral contrast. No inflammatory changes, mass lesions or obstructive findings. The appendix is normal. Vascular/Lymphatic: The aorta and branch vessels are patent. The major venous structures are patent. Circumaortic left renal vein noted. No mesenteric or retroperitoneal mass or adenopathy. Reproductive: The uterus and ovaries are unremarkable. The uterus is retroverted. Other: No pelvic mass or adenopathy. No free pelvic fluid collections. No inguinal mass or adenopathy. No abdominal wall hernia or subcutaneous lesions. Musculoskeletal: No worrisome bone lesions to suggest metastatic disease. IMPRESSION: 1. No findings to suggest metastatic breast cancer involving the chest, abdomen, pelvis or osseous structures. 2. Stable emphysematous changes and areas of bronchiectasis. 3. Stable surgical changes from a left upper lobe lobectomy. 4. Surgical changes involving the left breast and left axilla but no findings for local recurrence or regional adenopathy. Electronically Signed   By: PMarijo SanesM.D.   On: 07/28/2017 11:39      IMPRESSION/PLAN: 1. 52y.o. with new 3cm solitary brain mass  with characteristics of solitary metastasis but cannot rule out the potential for primary CNS neoplasm.  Today,  I talked to the patient and family about the findings and workup thus far. We discussed the natural history of metastatic breast cancer and general treatment, highlighting the role of radiotherapy in the management of brain metastasis. The recommendation at this time is to present her case at the upcoming multidisciplinary brain conference on 07/31/17 for consensus of treatment recommendation to proceed with surgical resection of the tumor for tissue confirmation and potential post-op radiotherapy versus proceeding with pre-op SRS.  We discussed the available radiation techniques, and focused on the details of logistics and delivery as it pertains to Stereotactic Radiosurgery [facility]). We reviewed the anticipated acute and late sequelae associated with radiation in this setting. The patient was encouraged to ask questions that were answered to her satisfaction.  At the conclusion of our conversation, the patient is interested in proceeding with preop stereotactic radiosurgery [facility]) if this is deemed appropriate based on recommendations from [facility] brain conference. She has freely signed written consent to proceed and a copy of this document is placed in her medical record. She understands that final treatment recommendation will be based on discussion at brain conference on 07/31/16 and we will be back in contact with her thereafter to make the appropriate arrangements for treatment at that time.    Nicholos Johns, PA-C    Tyler Pita, MD  [facility] Direct Dial: [phone]  Fax: [phone] [email or web address]  Skype  LinkedIn

## 2017-07-28 NOTE — Progress Notes (Signed)
Pt returned from CT, no concerns.

## 2017-07-29 DIAGNOSIS — C7931 Secondary malignant neoplasm of brain: Principal | ICD-10-CM

## 2017-07-29 MED ORDER — DEXAMETHASONE 4 MG PO TABS: 4.0000 mg | ORAL_TABLET | Freq: Four times a day (QID) | ORAL | 3 refills | Status: DC

## 2017-07-29 MED ORDER — ONDANSETRON 4 MG PO TBDP: 4.0000 mg | ORAL_TABLET | Freq: Three times a day (TID) | ORAL | 0 refills | Status: DC | PRN

## 2017-07-29 MED ORDER — PANTOPRAZOLE SODIUM 40 MG PO TBEC
40.0000 mg | DELAYED_RELEASE_TABLET | Freq: Every day | ORAL | Status: DC
  Administered 2017-07-29: 40 mg via ORAL
  Filled 2017-07-29: qty 1

## 2017-07-29 MED ORDER — HYDROCODONE-ACETAMINOPHEN 5-325 MG PO TABS: 1.0000 | ORAL_TABLET | Freq: Three times a day (TID) | ORAL | 0 refills | Status: DC | PRN

## 2017-07-29 MED ORDER — PANTOPRAZOLE SODIUM 40 MG PO TBEC: 40.0000 mg | DELAYED_RELEASE_TABLET | Freq: Every day | ORAL | 5 refills | Status: DC

## 2017-07-29 MED ORDER — NICOTINE 21 MG/24HR TD PT24: 21.0000 mg | MEDICATED_PATCH | Freq: Every day | TRANSDERMAL | 3 refills | Status: DC

## 2017-07-29 NOTE — Discharge Summary (Signed)
Physician Discharge Summary   Patient ID: Melissa [name] MRN: [id number] DOB/AGE: [birth year] 52 y.o.  Admit date: 07/27/2017 Discharge date: 07/29/2017  Primary Care Physician:  [facility]  Discharge Diagnoses:    . Solitary brain metastasis ([place]) . Breast cancer of upper-inner quadrant of left female breast ([place]) . Nicotine abuse . Migraines   Consults:   Neurosurgery, Dr. Cyndy Freeze Radiation oncology Oncology, Dr. Lindi Adie  Recommendations for Outpatient Follow-up:  1. Radiation oncology, patient's case will be discussed at tumor board on 1/21, subsequently patient will be contacted for further management, surgical resection, radiotherapy 2. Please repeat CBC/BMET at next visit   DIET: Heart healthy diet    Allergies:   Allergies  Allergen Reactions  . Aspirin Anaphylaxis    THROAT CLOSES  . Iodinated Diagnostic Agents Rash     DISCHARGE MEDICATIONS: Allergies as of 07/29/2017      Reactions   Aspirin Anaphylaxis   THROAT CLOSES   Iodinated Diagnostic Agents Rash      Medication List    STOP taking these medications   amoxicillin-clavulanate 875-125 MG tablet Commonly known as:  AUGMENTIN     TAKE these medications   albuterol 108 (90 Base) MCG/ACT inhaler Commonly known as:  VENTOLIN HFA Inhale 2 puffs into the lungs every 6 (six) hours as needed.   budesonide-formoterol 160-4.5 MCG/ACT inhaler Commonly known as:  SYMBICORT Inhale 2 puffs into the lungs daily.   dexamethasone 4 MG tablet Commonly known as:  DECADRON Take 1 tablet (4 mg total) by mouth every 6 (six) hours.   famotidine 20 MG tablet Commonly known as:  PEPCID One at bedtime What changed:    how much to take  how to take this  when to take this  additional instructions   FLUTTER Devi Use as directed   HYDROcodone-acetaminophen 5-325 MG tablet Commonly known as:  NORCO/VICODIN Take 1 tablet by mouth every 8 (eight) hours as needed for severe pain  (headache).   naproxen sodium 220 MG tablet Commonly known as:  ALEVE Take 220 mg by mouth 2 (two) times daily as needed (headache).   nicotine 21 mg/24hr patch Commonly known as:  NICODERM CQ - dosed in mg/24 hours Place 1 patch (21 mg total) onto the skin daily.   ondansetron 4 MG disintegrating tablet Commonly known as:  ZOFRAN ODT Take 1 tablet (4 mg total) by mouth every 8 (eight) hours as needed for nausea or vomiting.   pantoprazole 40 MG tablet Commonly known as:  PROTONIX Take 1 tablet (40 mg total) by mouth daily before breakfast.   pseudoephedrine 30 MG tablet Commonly known as:  SUDAFED Take 30 mg by mouth every 4 (four) hours as needed for congestion.   tamoxifen 20 MG tablet Commonly known as:  NOLVADEX TAKE 1 TABLET BY MOUTH DAILY        Brief H and P: For complete details please refer to admission H and P, but in brief Patient is a 52 year old with history of active nicotine abuse, history of [place] fever in [place] requiring left upper lung lobectomy in 1997, history of invasive ductal carcinoma, ER positive, PR positive, diagnosed in11/2013, underwent neoadjuvant chemotherapy1/2014-07/2013, left breast lumpectomy in June 2014, radiation therapy in 2014, currently on tamoxifen presented to ED with severe headaches in the last4-5weeks. Patient reported that for the last 1 month she was having symptoms of cold, chest congestion, sinusitis. She was treated with antibiotic and was now taking Augmentin. Patient was also seen in  [facility] ED on 06/20/17 and was treated for migraine headache, headache improved however returned back next day. Per patient, she saw her PCP and was going to schedule CT scan due to persistent headaches. Patient reported that she came to ED today as she was tired of waiting. She also reported nausea, vomiting, headache as 8/10 frontal as well as bilateral temporal and sometimes radiating to the back of the head. Patient initially thought  that she was having sinus headache or migraine headaches.   Hospital Course:  Frontal mass of brain with surrounding edema could be primary CNS neoplasm versus metastatic disease due to history of breast cancer - MRI of the brain showed 3.4x 2.9x 2.9 right frontal lobe mass with imaging characteristics of solitary metastasis with extensive vasogenic edema and midline shift -Headache significantly improving from the time of admission, patient was placed on IV Decadron, transition to oral Decadron at the time of discharge.  No focal neurological deficits. -Neurosurgery, radiation oncology, oncology consulted.  Appreciate recommendations. -CT chest, abdomen, pelvis showed no metastatic breast cancer involving chest abdomen, pelvis or osseous structures, stable emphysematous changes.  No local recurrence or regional adenopathy. -Per plan, patient will be discussed at the tumor board conference on 07/31/17, and subsequently patient will be contacted for further management including surgical resection, radiotherapy.  Oncology also planning oral lapatinib after the completion of SRS and surgery.  Breast cancer of upper-inner quadrant of left female breast ([place]) -history of invasive ductal carcinoma, ER positive, PR positive, diagnosed in 05/2012, underwent neoadjuvant chemotherapy1/2014-07/2013, left breast lumpectomy in June 2014, radiation therapy in 2014, currently on tamoxifen -Follows Dr. Lindi Adie, last visit in 08/2016.  Appreciate oncology follow-up during hospitalization.  Nicotine abuse -Smoking cessation encouraged, placed on nicotine patch  Migraines -Headaches due to #1, now improving after starting Decadron   Day of Discharge BP (!) 99/55 (BP Location: Right Arm)   Pulse 72   Temp 98.7 F (37.1 C) (Oral)   Resp 18   Ht 5\' 2"  (1.575 m)   Wt 51.7 kg (113 lb 15.7 oz)   SpO2 99%   BMI 20.85 kg/m   Physical Exam: General: Alert and awake oriented x3 not in any acute  distress. HEENT: anicteric sclera, pupils reactive to light and accommodation CVS: S1-S2 clear no murmur rubs or gallops Chest: clear to auscultation bilaterally, no wheezing rales or rhonchi Abdomen: soft nontender, nondistended, normal bowel sounds Extremities: no cyanosis, clubbing or edema noted bilaterally Neuro: Cranial nerves II-XII intact, no focal neurological deficits   The results of significant diagnostics from this hospitalization (including imaging, microbiology, ancillary and laboratory) are listed below for reference.    LAB RESULTS: Basic Metabolic Panel: Recent Labs  Lab 07/27/17 1619 07/27/17 2256 07/28/17 0607  NA 140  --  139  K 3.8  --  4.0  CL 106  --  108  CO2 24  --  19*  GLUCOSE 89  --  129*  BUN 8  --  11  CREATININE 0.99 1.17* 0.88  CALCIUM 9.2  --  9.0   Liver Function Tests: Recent Labs  Lab 07/27/17 1619  AST 20  ALT 18  ALKPHOS 51  BILITOT 0.4  PROT 6.4*  ALBUMIN 3.6   No results for input(s): LIPASE, AMYLASE in the last 168 hours. No results for input(s): AMMONIA in the last 168 hours. CBC: Recent Labs  Lab 07/27/17 1619 07/27/17 2256 07/28/17 0607  WBC 8.0 9.7 7.3  NEUTROABS 4.0  --   --  HGB 12.8 12.6 12.6  HCT 37.3 37.3 37.1  MCV 95.4 95.9 95.6  PLT 226 234 232   Cardiac Enzymes: No results for input(s): CKTOTAL, CKMB, CKMBINDEX, TROPONINI in the last 168 hours. BNP: Invalid input(s): POCBNP CBG: No results for input(s): GLUCAP in the last 168 hours.  Significant Diagnostic Studies:  Ct Chest W Contrast  Result Date: 07/28/2017 CLINICAL DATA:  Restaging breast cancer. Brain mass seen on MRI examination yesterday. EXAM: CT CHEST, ABDOMEN, AND PELVIS WITH CONTRAST TECHNIQUE: Multidetector CT imaging of the chest, abdomen and pelvis was performed following the standard protocol during bolus administration of intravenous contrast. CONTRAST:  112mL ISOVUE-300 IOPAMIDOL (ISOVUE-300) INJECTION 61% COMPARISON:  CT chest  07/19/2016 and CT abdomen/pelvis 07/18/2017 FINDINGS: CT CHEST FINDINGS Cardiovascular: The heart is normal in size and stable. No pericardial effusion. The aorta and branch vessels are patent. No dissection. Mediastinum/Nodes: No mediastinal or hilar mass or lymphadenopathy. The esophagus is grossly normal. Lungs/Pleura: Surgical changes from a prior left upper lobe lobectomy. There also radiation changes involving the anterior aspect of the left lung. No worrisome pulmonary nodules to suggest pulmonary metastatic disease. Stable emphysematous changes and areas of bronchiectasis. Left upper lobe bronchiectasis with fluid-filled bronchi are noted. No pleural effusion. No pleural lesions. Musculoskeletal: Surgical changes involving the left breast and left axilla. No findings suspicious for recurrent tumor. No supraclavicular or axillary lymphadenopathy. No worrisome lytic or sclerotic bone lesions to suggest metastatic disease. CT ABDOMEN PELVIS FINDINGS Hepatobiliary: No hepatic lesions to suggest metastasis. The gallbladder demonstrates layering high attenuation material, likely sludge, stones or both. No common bile duct dilatation. Pancreas: No mass, inflammation or ductal dilatation. Spleen: Normal size.  No focal lesions. Adrenals/Urinary Tract: The adrenal glands and kidneys are unremarkable. No findings for metastatic disease. No hydronephrosis. The bladder is normal. Stomach/Bowel: The stomach, duodenum, small bowel and colon are grossly normal without oral contrast. No inflammatory changes, mass lesions or obstructive findings. The appendix is normal. Vascular/Lymphatic: The aorta and branch vessels are patent. The major venous structures are patent. Circumaortic left renal vein noted. No mesenteric or retroperitoneal mass or adenopathy. Reproductive: The uterus and ovaries are unremarkable. The uterus is retroverted. Other: No pelvic mass or adenopathy. No free pelvic fluid collections. No inguinal mass  or adenopathy. No abdominal wall hernia or subcutaneous lesions. Musculoskeletal: No worrisome bone lesions to suggest metastatic disease. IMPRESSION: 1. No findings to suggest metastatic breast cancer involving the chest, abdomen, pelvis or osseous structures. 2. Stable emphysematous changes and areas of bronchiectasis. 3. Stable surgical changes from a left upper lobe lobectomy. 4. Surgical changes involving the left breast and left axilla but no findings for local recurrence or regional adenopathy. Electronically Signed   By: Marijo Sanes M.D.   On: 07/28/2017 11:39   Ct Abdomen Pelvis W Contrast  Result Date: 07/28/2017 CLINICAL DATA:  Restaging breast cancer. Brain mass seen on MRI examination yesterday. EXAM: CT CHEST, ABDOMEN, AND PELVIS WITH CONTRAST TECHNIQUE: Multidetector CT imaging of the chest, abdomen and pelvis was performed following the standard protocol during bolus administration of intravenous contrast. CONTRAST:  136mL ISOVUE-300 IOPAMIDOL (ISOVUE-300) INJECTION 61% COMPARISON:  CT chest 07/19/2016 and CT abdomen/pelvis 07/18/2017 FINDINGS: CT CHEST FINDINGS Cardiovascular: The heart is normal in size and stable. No pericardial effusion. The aorta and branch vessels are patent. No dissection. Mediastinum/Nodes: No mediastinal or hilar mass or lymphadenopathy. The esophagus is grossly normal. Lungs/Pleura: Surgical changes from a prior left upper lobe lobectomy. There also radiation changes involving the anterior  aspect of the left lung. No worrisome pulmonary nodules to suggest pulmonary metastatic disease. Stable emphysematous changes and areas of bronchiectasis. Left upper lobe bronchiectasis with fluid-filled bronchi are noted. No pleural effusion. No pleural lesions. Musculoskeletal: Surgical changes involving the left breast and left axilla. No findings suspicious for recurrent tumor. No supraclavicular or axillary lymphadenopathy. No worrisome lytic or sclerotic bone lesions to  suggest metastatic disease. CT ABDOMEN PELVIS FINDINGS Hepatobiliary: No hepatic lesions to suggest metastasis. The gallbladder demonstrates layering high attenuation material, likely sludge, stones or both. No common bile duct dilatation. Pancreas: No mass, inflammation or ductal dilatation. Spleen: Normal size.  No focal lesions. Adrenals/Urinary Tract: The adrenal glands and kidneys are unremarkable. No findings for metastatic disease. No hydronephrosis. The bladder is normal. Stomach/Bowel: The stomach, duodenum, small bowel and colon are grossly normal without oral contrast. No inflammatory changes, mass lesions or obstructive findings. The appendix is normal. Vascular/Lymphatic: The aorta and branch vessels are patent. The major venous structures are patent. Circumaortic left renal vein noted. No mesenteric or retroperitoneal mass or adenopathy. Reproductive: The uterus and ovaries are unremarkable. The uterus is retroverted. Other: No pelvic mass or adenopathy. No free pelvic fluid collections. No inguinal mass or adenopathy. No abdominal wall hernia or subcutaneous lesions. Musculoskeletal: No worrisome bone lesions to suggest metastatic disease. IMPRESSION: 1. No findings to suggest metastatic breast cancer involving the chest, abdomen, pelvis or osseous structures. 2. Stable emphysematous changes and areas of bronchiectasis. 3. Stable surgical changes from a left upper lobe lobectomy. 4. Surgical changes involving the left breast and left axilla but no findings for local recurrence or regional adenopathy. Electronically Signed   By: Marijo Sanes M.D.   On: 07/28/2017 11:39    2D ECHO:   Disposition and Follow-up: Discharge Instructions    Diet - low sodium heart healthy   Complete by:  As directed    Increase activity slowly   Complete by:  As directed        DISPOSITION: Home   DISCHARGE FOLLOW-UP Follow-up Information    [name], [name], MD. Schedule an appointment as soon as  possible for a visit in 1 week(s).   Specialty:  Neurosurgery Contact information: [address] [place] [address] [phone]        Nicholas Lose, MD. Schedule an appointment as soon as possible for a visit in 1 week(s).   Specialty:  Hematology and Oncology Contact information: [place] [address] [phone]            Time spent on Discharge: 35 minutes  Signed:   Estill Cotta M.D. [facility] 07/29/2017, 12:04 PM Pager: [phone]

## 2017-07-31 ENCOUNTER — Other Ambulatory Visit: Payer: Self-pay | Admitting: Neurological Surgery

## 2017-08-01 ENCOUNTER — Ambulatory Visit: Payer: 59

## 2017-08-01 ENCOUNTER — Ambulatory Visit: Payer: 59 | Admitting: Radiation Oncology

## 2017-08-02 NOTE — Pre-Procedure Instructions (Signed)
Talya Quain Jerrell  08/02/2017      [facility] - [address], [place] [address] Phone: [phone] Fax: [phone]    Your procedure is scheduled on Friday, August 04, 2017  Report to [facility] Admitting Entrance "A" at 11:40AM   Call this number if you have problems the morning of surgery:  [phone]   Remember:  Do not eat food or drink liquids after midnight.  Take these medicines the morning of surgery with A SIP OF WATER: Dexamethasone (DECADRON), Pantoprazole (PROTONIX), Tamoxifen (NOLVADEX), and Budesonide-formoterol (SYMBICORT). If needed Ondansetron (ZOFRAN ODT) for nausea, HYDROcodone-acetaminophen (NORCO/VICODIN) for pain, and Albuterol Inhaler for cough or wheezing (Bring with you the day of surgery).  As of today, stop taking all Aspirins, Vitamins, Fish oils, and Herbal medications. Also stop all NSAIDS i.e. Advil, Ibuprofen, Motrin, Aleve, Anaprox, Naproxen, BC and Goody Powders.   Do not wear jewelry, make-up or nail polish.  Do not wear lotions, powders, perfumes, or deodorant.  Do not shave 48 hours prior to surgery.    Do not bring valuables to the hospital.  [facility] is not responsible for any belongings or valuables.  Contacts, dentures or bridgework may not be worn into surgery.  Leave your suitcase in the car.  After surgery it may be brought to your room.  For patients admitted to the hospital, discharge time will be determined by your treatment team.  Patients discharged the day of surgery will not be allowed to drive home.   Special instructions:   - Preparing For Surgery  Before surgery, you can play an important role. Because skin is not sterile, your skin needs to be as free of germs as possible. You can reduce the number of germs on your skin by washing with CHG (chlorahexidine gluconate) Soap before surgery.  CHG  is an antiseptic cleaner which kills germs and bonds with the skin to continue killing germs even after washing.  Please do not use if you have an allergy to CHG or antibacterial soaps. If your skin becomes reddened/irritated stop using the CHG.  Do not shave (including legs and underarms) for at least 48 hours prior to first CHG shower. It is OK to shave your face.  Please follow these instructions carefully.   1. Shower the NIGHT BEFORE SURGERY and the MORNING OF SURGERY with CHG.   2. If you chose to wash your hair, wash your hair first as usual with your normal shampoo.  3. After you shampoo, rinse your hair and body thoroughly to remove the shampoo.  4. Use CHG as you would any other liquid soap. You can apply CHG directly to the skin and wash gently with a scrungie or a clean washcloth.   5. Apply the CHG Soap to your body ONLY FROM THE NECK DOWN.  Do not use on open wounds or open sores. Avoid contact with your eyes, ears, mouth and genitals (private parts). Wash Face and genitals (private parts)  with your normal soap.  6. Wash thoroughly, paying special attention to the area where your surgery will be performed.  7. Thoroughly rinse your body with warm water from the neck down.  8. DO NOT shower/wash with your normal soap after using and rinsing off the CHG Soap.  9. Pat yourself dry with a CLEAN TOWEL.  10. Wear CLEAN PAJAMAS to bed the night  before surgery, wear comfortable clothes the morning of surgery  11. Place CLEAN SHEETS on your bed the night of your first shower and DO NOT SLEEP WITH PETS.  Day of Surgery: Do not apply any deodorants/lotions. Please wear clean clothes to the hospital/surgery center.    Please read over the following fact sheets that you were given. Pain Booklet, Coughing and Deep Breathing and Surgical Site Infection Prevention

## 2017-08-03 ENCOUNTER — Other Ambulatory Visit: Payer: Self-pay

## 2017-08-03 ENCOUNTER — Encounter (HOSPITAL_COMMUNITY)
Admission: RE | Admit: 2017-08-03 | Discharge: 2017-08-03 | Disposition: A | Discharge status: Home / Self Care | Payer: 59 | Source: Ambulatory Visit | Attending: Neurological Surgery | Admitting: Neurological Surgery

## 2017-08-03 ENCOUNTER — Encounter (HOSPITAL_COMMUNITY): Payer: Self-pay | Admitting: *Deleted

## 2017-08-03 HISTORY — DX: Personal history of other diseases of the digestive system: Z87.19

## 2017-08-03 HISTORY — DX: Gastro-esophageal reflux disease without esophagitis: K21.9

## 2017-08-03 HISTORY — DX: Other complications of anesthesia, initial encounter: T88.59XA

## 2017-08-03 HISTORY — DX: Adverse effect of unspecified anesthetic, initial encounter: T41.45XA

## 2017-08-03 HISTORY — DX: Chronic obstructive pulmonary disease, unspecified: J44.9

## 2017-08-03 LAB — BASIC METABOLIC PANEL
Anion gap: 10 (ref 5–15)
BUN: 19 mg/dL (ref 6–20)
CO2: 28 mmol/L (ref 22–32)
CREATININE: 0.89 mg/dL (ref 0.44–1.00)
Calcium: 9.5 mg/dL (ref 8.9–10.3)
Chloride: 99 mmol/L — ABNORMAL LOW (ref 101–111)
GFR calc Af Amer: >60 mL/min (ref >60)
GFR calc non Af Amer: >60 mL/min (ref >60)
GLUCOSE: 102 mg/dL — AB (ref 65–99)
POTASSIUM: 4.3 mmol/L (ref 3.5–5.1)
SODIUM: 137 mmol/L (ref 135–145)

## 2017-08-03 LAB — CBC
HEMATOCRIT: 40.2 % (ref 36.0–46.0)
Hemoglobin: 13.8 g/dL (ref 12.0–15.0)
MCH: 32.5 pg (ref 26.0–34.0)
MCHC: 34.3 g/dL (ref 30.0–36.0)
MCV: 94.6 fL (ref 78.0–100.0)
Platelets: 271 10*3/uL (ref 150–400)
RBC: 4.25 MIL/uL (ref 3.87–5.11)
RDW: 13.5 % (ref 11.5–15.5)
WBC: 13.5 10*3/uL — AB (ref 4.0–10.5)

## 2017-08-03 LAB — TYPE AND SCREEN
ABO/RH(D): O POS
Antibody Screen: NEGATIVE

## 2017-08-03 LAB — GLUCOSE, CAPILLARY: GLUCOSE-CAPILLARY: 165 mg/dL — AB (ref 65–99)

## 2017-08-03 NOTE — Progress Notes (Signed)
Pulmonary -  Dr. Melvyn Novas  Patient not seeing a cardilogist, cardiac studies done prior to chemo.

## 2017-08-04 ENCOUNTER — Inpatient Hospital Stay (HOSPITAL_COMMUNITY): Payer: 59 | Admitting: Certified Registered"

## 2017-08-04 ENCOUNTER — Inpatient Hospital Stay (HOSPITAL_COMMUNITY): Payer: 59

## 2017-08-04 ENCOUNTER — Inpatient Hospital Stay (HOSPITAL_COMMUNITY)
Admission: RE | Admit: 2017-08-04 | Discharge: 2017-08-05 | DRG: 027 | Disposition: A | Discharge status: Home / Self Care | Payer: 59 | Source: Ambulatory Visit | Attending: Neurological Surgery | Admitting: Neurological Surgery

## 2017-08-04 ENCOUNTER — Encounter (HOSPITAL_COMMUNITY): Payer: Self-pay | Admitting: Certified Registered"

## 2017-08-04 ENCOUNTER — Inpatient Hospital Stay (HOSPITAL_COMMUNITY)
Admission: RE | Disposition: A | Discharge status: Home / Self Care | Payer: Self-pay | Source: Ambulatory Visit | Attending: Neurological Surgery

## 2017-08-04 DIAGNOSIS — Z923 Personal history of irradiation: Secondary | ICD-10-CM

## 2017-08-04 DIAGNOSIS — Z902 Acquired absence of lung [part of]: Secondary | ICD-10-CM

## 2017-08-04 DIAGNOSIS — C7931 Secondary malignant neoplasm of brain: Secondary | ICD-10-CM | POA: Diagnosis present

## 2017-08-04 DIAGNOSIS — Z886 Allergy status to analgesic agent status: Secondary | ICD-10-CM

## 2017-08-04 DIAGNOSIS — Z853 Personal history of malignant neoplasm of breast: Secondary | ICD-10-CM | POA: Diagnosis not present

## 2017-08-04 DIAGNOSIS — Z9221 Personal history of antineoplastic chemotherapy: Secondary | ICD-10-CM

## 2017-08-04 DIAGNOSIS — Z91041 Radiographic dye allergy status: Secondary | ICD-10-CM

## 2017-08-04 DIAGNOSIS — K219 Gastro-esophageal reflux disease without esophagitis: Secondary | ICD-10-CM | POA: Diagnosis present

## 2017-08-04 DIAGNOSIS — D496 Neoplasm of unspecified behavior of brain: Secondary | ICD-10-CM

## 2017-08-04 DIAGNOSIS — F17210 Nicotine dependence, cigarettes, uncomplicated: Secondary | ICD-10-CM | POA: Diagnosis present

## 2017-08-04 DIAGNOSIS — Z7951 Long term (current) use of inhaled steroids: Secondary | ICD-10-CM | POA: Diagnosis not present

## 2017-08-04 DIAGNOSIS — J449 Chronic obstructive pulmonary disease, unspecified: Secondary | ICD-10-CM | POA: Diagnosis present

## 2017-08-04 DIAGNOSIS — Z79899 Other long term (current) drug therapy: Secondary | ICD-10-CM | POA: Diagnosis not present

## 2017-08-04 DIAGNOSIS — Z452 Encounter for adjustment and management of vascular access device: Secondary | ICD-10-CM

## 2017-08-04 HISTORY — PX: CRANIOTOMY: SHX93

## 2017-08-04 HISTORY — PX: APPLICATION OF CRANIAL NAVIGATION: SHX6578

## 2017-08-04 LAB — ABO/RH: ABO/RH(D): O POS

## 2017-08-04 LAB — MRSA PCR SCREENING: MRSA BY PCR: NEGATIVE

## 2017-08-04 SURGERY — CRANIOTOMY TUMOR EXCISION
Anesthesia: General | Laterality: Right

## 2017-08-04 MED ORDER — ONDANSETRON 4 MG PO TBDP: 4.0000 mg | ORAL_TABLET | Freq: Three times a day (TID) | ORAL | Status: DC | PRN

## 2017-08-04 MED ORDER — BUPIVACAINE-EPINEPHRINE (PF) 0.5% -1:200000 IJ SOLN
INTRAMUSCULAR | Status: DC | PRN
  Administered 2017-08-04: 15 mL

## 2017-08-04 MED ORDER — ONDANSETRON HCL 4 MG PO TABS: 4.0000 mg | ORAL_TABLET | ORAL | Status: DC | PRN

## 2017-08-04 MED ORDER — LIDOCAINE-EPINEPHRINE 2 %-1:100000 IJ SOLN
INTRAMUSCULAR | Status: AC
  Filled 2017-08-04: qty 1

## 2017-08-04 MED ORDER — HEMOSTATIC AGENTS (NO CHARGE) OPTIME
TOPICAL | Status: DC | PRN
  Administered 2017-08-04: 1 via TOPICAL

## 2017-08-04 MED ORDER — 0.9 % SODIUM CHLORIDE (POUR BTL) OPTIME
TOPICAL | Status: DC | PRN
  Administered 2017-08-04 (×3): 1000 mL

## 2017-08-04 MED ORDER — HYDROMORPHONE HCL 1 MG/ML IJ SOLN
0.5000 mg | INTRAMUSCULAR | Status: DC | PRN
  Administered 2017-08-05: 0.5 mg via INTRAVENOUS
  Administered 2017-08-05: 1 mg via INTRAVENOUS
  Filled 2017-08-04: qty 0.5
  Filled 2017-08-04: qty 1

## 2017-08-04 MED ORDER — SODIUM CHLORIDE 0.9 % IR SOLN
Status: DC | PRN
  Administered 2017-08-04: 15:00:00

## 2017-08-04 MED ORDER — BISACODYL 5 MG PO TBEC: 5.0000 mg | DELAYED_RELEASE_TABLET | Freq: Every day | ORAL | Status: DC | PRN

## 2017-08-04 MED ORDER — DEXAMETHASONE SODIUM PHOSPHATE 10 MG/ML IJ SOLN
6.0000 mg | Freq: Four times a day (QID) | INTRAMUSCULAR | Status: AC
  Administered 2017-08-04 – 2017-08-05 (×4): 6 mg via INTRAVENOUS
  Filled 2017-08-04 (×4): qty 1

## 2017-08-04 MED ORDER — THROMBIN 5000 UNITS EX SOLR
CUTANEOUS | Status: AC
  Filled 2017-08-04: qty 5000

## 2017-08-04 MED ORDER — EPHEDRINE SULFATE-NACL 50-0.9 MG/10ML-% IV SOSY
PREFILLED_SYRINGE | INTRAVENOUS | Status: DC | PRN
  Administered 2017-08-04: 15 mg via INTRAVENOUS
  Administered 2017-08-04: 10 mg via INTRAVENOUS

## 2017-08-04 MED ORDER — EPHEDRINE 5 MG/ML INJ
INTRAVENOUS | Status: AC
  Filled 2017-08-04: qty 20

## 2017-08-04 MED ORDER — PSEUDOEPHEDRINE HCL 30 MG PO TABS: 30.0000 mg | ORAL_TABLET | ORAL | Status: DC | PRN

## 2017-08-04 MED ORDER — THROMBIN (RECOMBINANT) 5000 UNITS EX SOLR
OROMUCOSAL | Status: DC | PRN
  Administered 2017-08-04: 15:00:00 via TOPICAL

## 2017-08-04 MED ORDER — THROMBIN (RECOMBINANT) 20000 UNITS EX SOLR
CUTANEOUS | Status: DC | PRN
  Administered 2017-08-04: 15:00:00 via TOPICAL

## 2017-08-04 MED ORDER — ONDANSETRON HCL 4 MG/2ML IJ SOLN
4.0000 mg | INTRAMUSCULAR | Status: DC | PRN
Start: 2017-08-04 — End: 2017-08-05
  Administered 2017-08-05 (×2): 4 mg via INTRAVENOUS
  Filled 2017-08-04 (×2): qty 2

## 2017-08-04 MED ORDER — NALOXONE HCL 0.4 MG/ML IJ SOLN: 0.0800 mg | INTRAMUSCULAR | Status: DC | PRN

## 2017-08-04 MED ORDER — ENSURE ENLIVE PO LIQD: 237.0000 mL | Freq: Two times a day (BID) | ORAL | Status: DC

## 2017-08-04 MED ORDER — FENTANYL CITRATE (PF) 250 MCG/5ML IJ SOLN
INTRAMUSCULAR | Status: AC
  Filled 2017-08-04: qty 5

## 2017-08-04 MED ORDER — MIDAZOLAM HCL 2 MG/2ML IJ SOLN
INTRAMUSCULAR | Status: AC
  Filled 2017-08-04: qty 2

## 2017-08-04 MED ORDER — CHLORHEXIDINE GLUCONATE CLOTH 2 % EX PADS: 6.0000 | MEDICATED_PAD | Freq: Once | CUTANEOUS | Status: DC

## 2017-08-04 MED ORDER — ESMOLOL HCL 100 MG/10ML IV SOLN
INTRAVENOUS | Status: AC
  Filled 2017-08-04: qty 10

## 2017-08-04 MED ORDER — CEFAZOLIN SODIUM-DEXTROSE 2-4 GM/100ML-% IV SOLN
2.0000 g | Freq: Three times a day (TID) | INTRAVENOUS | Status: AC
  Administered 2017-08-04 – 2017-08-05 (×2): 2 g via INTRAVENOUS
  Filled 2017-08-04 (×3): qty 100

## 2017-08-04 MED ORDER — NICOTINE 21 MG/24HR TD PT24
21.0000 mg | MEDICATED_PATCH | Freq: Every day | TRANSDERMAL | Status: DC
  Administered 2017-08-04 – 2017-08-05 (×2): 21 mg via TRANSDERMAL
  Filled 2017-08-04 (×2): qty 1

## 2017-08-04 MED ORDER — FENTANYL CITRATE (PF) 100 MCG/2ML IJ SOLN
INTRAMUSCULAR | Status: DC | PRN
  Administered 2017-08-04: 50 ug via INTRAVENOUS

## 2017-08-04 MED ORDER — SUGAMMADEX SODIUM 200 MG/2ML IV SOLN
INTRAVENOUS | Status: DC | PRN
  Administered 2017-08-04: 100 mg via INTRAVENOUS

## 2017-08-04 MED ORDER — DEXAMETHASONE SODIUM PHOSPHATE 4 MG/ML IJ SOLN
4.0000 mg | Freq: Four times a day (QID) | INTRAMUSCULAR | Status: DC
  Administered 2017-08-05: 4 mg via INTRAVENOUS
  Filled 2017-08-04: qty 1

## 2017-08-04 MED ORDER — ONDANSETRON HCL 4 MG/2ML IJ SOLN
INTRAMUSCULAR | Status: AC
  Filled 2017-08-04: qty 6

## 2017-08-04 MED ORDER — LIDOCAINE 2% (20 MG/ML) 5 ML SYRINGE
INTRAMUSCULAR | Status: DC | PRN
  Administered 2017-08-04: 60 mg via INTRAVENOUS

## 2017-08-04 MED ORDER — PROMETHAZINE HCL 25 MG PO TABS: 12.5000 mg | ORAL_TABLET | ORAL | Status: DC | PRN

## 2017-08-04 MED ORDER — DEXAMETHASONE SODIUM PHOSPHATE 10 MG/ML IJ SOLN
INTRAMUSCULAR | Status: AC
  Filled 2017-08-04: qty 2

## 2017-08-04 MED ORDER — DOCUSATE SODIUM 100 MG PO CAPS
100.0000 mg | ORAL_CAPSULE | Freq: Two times a day (BID) | ORAL | Status: DC
  Administered 2017-08-04 – 2017-08-05 (×2): 100 mg via ORAL
  Filled 2017-08-04 (×2): qty 1

## 2017-08-04 MED ORDER — SODIUM CHLORIDE 0.9 % IV SOLN
INTRAVENOUS | Status: DC
  Administered 2017-08-04 (×2): via INTRAVENOUS

## 2017-08-04 MED ORDER — ROCURONIUM BROMIDE 10 MG/ML (PF) SYRINGE
PREFILLED_SYRINGE | INTRAVENOUS | Status: AC
  Filled 2017-08-04: qty 15

## 2017-08-04 MED ORDER — OXYCODONE HCL 5 MG PO TABS: 5.0000 mg | ORAL_TABLET | Freq: Once | ORAL | Status: DC | PRN

## 2017-08-04 MED ORDER — ROCURONIUM BROMIDE 10 MG/ML (PF) SYRINGE
PREFILLED_SYRINGE | INTRAVENOUS | Status: DC | PRN
  Administered 2017-08-04: 60 mg via INTRAVENOUS

## 2017-08-04 MED ORDER — FENTANYL CITRATE (PF) 100 MCG/2ML IJ SOLN
INTRAMUSCULAR | Status: AC
  Filled 2017-08-04: qty 2

## 2017-08-04 MED ORDER — MANNITOL 25 % IV SOLN
50.0000 g | INTRAVENOUS | Status: AC
  Administered 2017-08-04: 50 g via INTRAVENOUS
  Filled 2017-08-04: qty 200

## 2017-08-04 MED ORDER — BACITRACIN ZINC 500 UNIT/GM EX OINT
TOPICAL_OINTMENT | CUTANEOUS | Status: AC
  Filled 2017-08-04: qty 28.35

## 2017-08-04 MED ORDER — PHENYLEPHRINE 40 MCG/ML (10ML) SYRINGE FOR IV PUSH (FOR BLOOD PRESSURE SUPPORT)
PREFILLED_SYRINGE | INTRAVENOUS | Status: AC
  Filled 2017-08-04: qty 20

## 2017-08-04 MED ORDER — TAMOXIFEN CITRATE 10 MG PO TABS
20.0000 mg | ORAL_TABLET | Freq: Every day | ORAL | Status: DC
  Administered 2017-08-04 – 2017-08-05 (×2): 20 mg via ORAL
  Filled 2017-08-04 (×2): qty 2

## 2017-08-04 MED ORDER — PANTOPRAZOLE SODIUM 40 MG IV SOLR
40.0000 mg | Freq: Every day | INTRAVENOUS | Status: DC
  Administered 2017-08-04: 40 mg via INTRAVENOUS
  Filled 2017-08-04: qty 40

## 2017-08-04 MED ORDER — MOMETASONE FURO-FORMOTEROL FUM 200-5 MCG/ACT IN AERO
2.0000 | INHALATION_SPRAY | Freq: Two times a day (BID) | RESPIRATORY_TRACT | Status: DC
  Administered 2017-08-04 – 2017-08-05 (×2): 2 via RESPIRATORY_TRACT
  Filled 2017-08-04: qty 8.8

## 2017-08-04 MED ORDER — FLEET ENEMA 7-19 GM/118ML RE ENEM: 1.0000 | ENEMA | Freq: Once | RECTAL | Status: DC | PRN

## 2017-08-04 MED ORDER — FENTANYL CITRATE (PF) 100 MCG/2ML IJ SOLN: 25.0000 ug | INTRAMUSCULAR | Status: DC | PRN

## 2017-08-04 MED ORDER — BUPIVACAINE-EPINEPHRINE (PF) 0.5% -1:200000 IJ SOLN
INTRAMUSCULAR | Status: AC
  Filled 2017-08-04: qty 30

## 2017-08-04 MED ORDER — CEFAZOLIN SODIUM-DEXTROSE 2-4 GM/100ML-% IV SOLN
2.0000 g | INTRAVENOUS | Status: AC
  Administered 2017-08-04: 2 g via INTRAVENOUS

## 2017-08-04 MED ORDER — HYDROCODONE-ACETAMINOPHEN 5-325 MG PO TABS
1.0000 | ORAL_TABLET | ORAL | Status: DC | PRN
  Administered 2017-08-05: 1 via ORAL
  Filled 2017-08-04 (×2): qty 1

## 2017-08-04 MED ORDER — DEXAMETHASONE SODIUM PHOSPHATE 10 MG/ML IJ SOLN
INTRAMUSCULAR | Status: DC | PRN
  Administered 2017-08-04: 10 mg via INTRAVENOUS

## 2017-08-04 MED ORDER — ONDANSETRON HCL 4 MG/2ML IJ SOLN
INTRAMUSCULAR | Status: DC | PRN
  Administered 2017-08-04: 4 mg via INTRAVENOUS

## 2017-08-04 MED ORDER — LEVETIRACETAM 500 MG/5ML IV SOLN
1000.0000 mg | INTRAVENOUS | Status: AC
  Administered 2017-08-04: 1000 mg via INTRAVENOUS
  Filled 2017-08-04: qty 10

## 2017-08-04 MED ORDER — SENNA 8.6 MG PO TABS
1.0000 | ORAL_TABLET | Freq: Two times a day (BID) | ORAL | Status: DC
  Administered 2017-08-04 – 2017-08-05 (×2): 8.6 mg via ORAL
  Filled 2017-08-04 (×2): qty 1

## 2017-08-04 MED ORDER — CEFAZOLIN SODIUM-DEXTROSE 2-4 GM/100ML-% IV SOLN
INTRAVENOUS | Status: AC
  Filled 2017-08-04: qty 100

## 2017-08-04 MED ORDER — FENTANYL CITRATE (PF) 100 MCG/2ML IJ SOLN
INTRAMUSCULAR | Status: AC
  Administered 2017-08-04: 50 ug
  Filled 2017-08-04: qty 2

## 2017-08-04 MED ORDER — PROPOFOL 10 MG/ML IV BOLUS
INTRAVENOUS | Status: DC | PRN
  Administered 2017-08-04: 50 mg via INTRAVENOUS
  Administered 2017-08-04: 30 mg via INTRAVENOUS
  Administered 2017-08-04: 70 mg via INTRAVENOUS

## 2017-08-04 MED ORDER — LIDOCAINE-EPINEPHRINE 2 %-1:100000 IJ SOLN
INTRAMUSCULAR | Status: DC | PRN
  Administered 2017-08-04: 15 mL

## 2017-08-04 MED ORDER — REMIFENTANIL HCL 1 MG IV SOLR
0.0125 ug/kg/min | INTRAVENOUS | Status: AC
Start: 2017-08-04 — End: 2017-08-04
  Administered 2017-08-04: .2 ug/kg/min via INTRAVENOUS
  Filled 2017-08-04: qty 2000

## 2017-08-04 MED ORDER — PHENYLEPHRINE 40 MCG/ML (10ML) SYRINGE FOR IV PUSH (FOR BLOOD PRESSURE SUPPORT)
PREFILLED_SYRINGE | INTRAVENOUS | Status: DC | PRN
  Administered 2017-08-04: 120 ug via INTRAVENOUS

## 2017-08-04 MED ORDER — FAMOTIDINE 20 MG PO TABS: 20.0000 mg | ORAL_TABLET | Freq: Every day | ORAL | Status: DC

## 2017-08-04 MED ORDER — HYDRALAZINE HCL 20 MG/ML IJ SOLN: 5.0000 mg | INTRAMUSCULAR | Status: DC | PRN

## 2017-08-04 MED ORDER — PANTOPRAZOLE SODIUM 40 MG PO TBEC: 40.0000 mg | DELAYED_RELEASE_TABLET | Freq: Every day | ORAL | Status: DC

## 2017-08-04 MED ORDER — SODIUM CHLORIDE 0.9 % IV SOLN
INTRAVENOUS | Status: DC | PRN
  Administered 2017-08-04: 14:00:00 via INTRAVENOUS

## 2017-08-04 MED ORDER — DEXAMETHASONE SODIUM PHOSPHATE 4 MG/ML IJ SOLN: 4.0000 mg | Freq: Three times a day (TID) | INTRAMUSCULAR | Status: DC

## 2017-08-04 MED ORDER — FUROSEMIDE 10 MG/ML IJ SOLN
10.0000 mg | INTRAMUSCULAR | Status: AC
  Administered 2017-08-04: 10 mg via INTRAVENOUS
  Filled 2017-08-04: qty 4

## 2017-08-04 MED ORDER — GELATIN ABSORBABLE 12-7 MM EX MISC
CUTANEOUS | Status: DC | PRN
  Administered 2017-08-04: 1

## 2017-08-04 MED ORDER — THROMBIN 20000 UNITS EX SOLR
CUTANEOUS | Status: AC
  Filled 2017-08-04: qty 20000

## 2017-08-04 MED ORDER — SODIUM CHLORIDE 0.9 % IV SOLN
500.0000 mg | Freq: Two times a day (BID) | INTRAVENOUS | Status: DC
  Administered 2017-08-05 (×2): 500 mg via INTRAVENOUS
  Filled 2017-08-04 (×3): qty 5

## 2017-08-04 MED ORDER — BACITRACIN ZINC 500 UNIT/GM EX OINT
TOPICAL_OINTMENT | CUTANEOUS | Status: DC | PRN
  Administered 2017-08-04: 1 via TOPICAL

## 2017-08-04 MED ORDER — PROPOFOL 10 MG/ML IV BOLUS
INTRAVENOUS | Status: AC
  Filled 2017-08-04: qty 20

## 2017-08-04 MED ORDER — OXYCODONE HCL 5 MG/5ML PO SOLN: 5.0000 mg | Freq: Once | ORAL | Status: DC | PRN

## 2017-08-04 MED ORDER — SODIUM CHLORIDE 0.9 % IV SOLN
INTRAVENOUS | Status: DC
  Administered 2017-08-04 – 2017-08-05 (×2): via INTRAVENOUS

## 2017-08-04 MED ORDER — ALBUTEROL SULFATE (2.5 MG/3ML) 0.083% IN NEBU: 2.5000 mg | INHALATION_SOLUTION | Freq: Four times a day (QID) | RESPIRATORY_TRACT | Status: DC | PRN

## 2017-08-04 MED ORDER — LIDOCAINE 2% (20 MG/ML) 5 ML SYRINGE
INTRAMUSCULAR | Status: AC
  Filled 2017-08-04: qty 15

## 2017-08-04 MED ORDER — DEXTROSE 5 % IV SOLN
INTRAVENOUS | Status: DC | PRN
  Administered 2017-08-04: 40 ug/min via INTRAVENOUS

## 2017-08-04 SURGICAL SUPPLY — 91 items
BAG DECANTER FOR FLEXI CONT (MISCELLANEOUS) ×4 IMPLANT
BATTERY IQ STERILE (MISCELLANEOUS) ×8 IMPLANT
BENZOIN TINCTURE PRP APPL 2/3 (GAUZE/BANDAGES/DRESSINGS) IMPLANT
BLADE CLIPPER SURG (BLADE) ×4 IMPLANT
BLADE ULTRA TIP 2M (BLADE) ×4 IMPLANT
BNDG GAUZE ELAST 4 BULKY (GAUZE/BANDAGES/DRESSINGS) IMPLANT
BUR ACORN 6.0 PRECISION (BURR) ×3 IMPLANT
BUR ACORN 6.0MM PRECISION (BURR) ×1
BUR MATCHSTICK NEURO 3.0 LAGG (BURR) ×4 IMPLANT
BUR SPIRAL ROUTER 2.3 (BUR) ×3 IMPLANT
BUR SPIRAL ROUTER 2.3MM (BUR) ×1
CANISTER SUCT 3000ML PPV (MISCELLANEOUS) ×8 IMPLANT
CARTRIDGE OIL MAESTRO DRILL (MISCELLANEOUS) ×2 IMPLANT
CATH ROBINSON RED A/P 14FR (CATHETERS) IMPLANT
CHLORAPREP W/TINT 26ML (MISCELLANEOUS) ×4 IMPLANT
CLIP RANEY DISP (INSTRUMENTS) ×4 IMPLANT
CLIP VESOCCLUDE MED 6/CT (CLIP) IMPLANT
CONT SPEC 4OZ CLIKSEAL STRL BL (MISCELLANEOUS) ×8 IMPLANT
DECANTER SPIKE VIAL GLASS SM (MISCELLANEOUS) ×8 IMPLANT
DIFFUSER DRILL AIR PNEUMATIC (MISCELLANEOUS) ×4 IMPLANT
DRAIN JACKSON PRATT 10MM FLAT (MISCELLANEOUS) ×4 IMPLANT
DRAPE NEUROLOGICAL W/INCISE (DRAPES) ×4 IMPLANT
DRAPE SHEET LG 3/4 BI-LAMINATE (DRAPES) ×8 IMPLANT
DRAPE SURG 17X23 STRL (DRAPES) IMPLANT
DRAPE WARM FLUID 44X44 (DRAPE) ×4 IMPLANT
DRSG MEPILEX BORDER 4X12 (GAUZE/BANDAGES/DRESSINGS) ×4 IMPLANT
ELECT COATED BLADE 2.86 ST (ELECTRODE) ×4 IMPLANT
ELECT REM PT RETURN 9FT ADLT (ELECTROSURGICAL) ×4
ELECTRODE REM PT RTRN 9FT ADLT (ELECTROSURGICAL) ×2 IMPLANT
EVACUATOR 1/8 PVC DRAIN (DRAIN) IMPLANT
EVACUATOR SILICONE 100CC (DRAIN) ×4 IMPLANT
FORCEPS BIPOLAR SPETZLER 8 1.0 (NEUROSURGERY SUPPLIES) ×4 IMPLANT
GAUZE SPONGE 4X4 12PLY STRL (GAUZE/BANDAGES/DRESSINGS) ×4 IMPLANT
GAUZE SPONGE 4X4 16PLY XRAY LF (GAUZE/BANDAGES/DRESSINGS) IMPLANT
GLOVE BIO SURGEON STRL SZ7 (GLOVE) IMPLANT
GLOVE BIOGEL PI IND STRL 7.0 (GLOVE) IMPLANT
GLOVE BIOGEL PI IND STRL 7.5 (GLOVE) ×2 IMPLANT
GLOVE BIOGEL PI INDICATOR 7.0 (GLOVE)
GLOVE BIOGEL PI INDICATOR 7.5 (GLOVE) ×2
GLOVE EXAM NITRILE LRG STRL (GLOVE) ×4 IMPLANT
GLOVE EXAM NITRILE XL STR (GLOVE) IMPLANT
GLOVE EXAM NITRILE XS STR PU (GLOVE) IMPLANT
GLOVE SS BIOGEL STRL SZ 7.5 (GLOVE) ×4 IMPLANT
GLOVE SUPERSENSE BIOGEL SZ 7.5 (GLOVE) ×4
GOWN STRL REUS W/ TWL LRG LVL3 (GOWN DISPOSABLE) ×2 IMPLANT
GOWN STRL REUS W/ TWL XL LVL3 (GOWN DISPOSABLE) IMPLANT
GOWN STRL REUS W/TWL 2XL LVL3 (GOWN DISPOSABLE) IMPLANT
GOWN STRL REUS W/TWL LRG LVL3 (GOWN DISPOSABLE) ×3
GOWN STRL REUS W/TWL XL LVL3 (GOWN DISPOSABLE)
HEMOSTAT POWDER KIT SURGIFOAM (HEMOSTASIS) ×4 IMPLANT
HEMOSTAT SURGICEL 2X14 (HEMOSTASIS) ×4 IMPLANT
HOOK DURA 1/2IN (MISCELLANEOUS) ×4 IMPLANT
KIT BASIN OR (CUSTOM PROCEDURE TRAY) ×4 IMPLANT
KIT ROOM TURNOVER OR (KITS) ×4 IMPLANT
MARKER SPHERE PSV REFLC 13MM (MARKER) ×12 IMPLANT
NEEDLE HYPO 21X1.5 SAFETY (NEEDLE) ×4 IMPLANT
NEEDLE HYPO 25X1 1.5 SAFETY (NEEDLE) IMPLANT
NS IRRIG 1000ML POUR BTL (IV SOLUTION) ×8 IMPLANT
OIL CARTRIDGE MAESTRO DRILL (MISCELLANEOUS) ×4
PACK CRANIOTOMY CUSTOM (CUSTOM PROCEDURE TRAY) ×4 IMPLANT
PATTIES SURGICAL .5 X.5 (GAUZE/BANDAGES/DRESSINGS) IMPLANT
PATTIES SURGICAL .5 X3 (DISPOSABLE) IMPLANT
PATTIES SURGICAL .5X1.5 (GAUZE/BANDAGES/DRESSINGS) ×4 IMPLANT
PATTIES SURGICAL 1X1 (DISPOSABLE) IMPLANT
PIN MAYFIELD SKULL DISP (PIN) ×4 IMPLANT
PLATE 1.5  2HOLE MED NEURO (Plate) ×6 IMPLANT
PLATE 1.5 2HOLE MED NEURO (Plate) ×6 IMPLANT
PLATE 1.5/0.5 18.5MM BURR HOLE (Plate) ×12 IMPLANT
SCREW SELF DRILL HT 1.5/4MM (Screw) ×68 IMPLANT
SPONGE LAP 18X18 X RAY DECT (DISPOSABLE) ×4 IMPLANT
SPONGE NEURO XRAY DETECT 1X3 (DISPOSABLE) IMPLANT
SPONGE SURGIFOAM ABS GEL 100 (HEMOSTASIS) ×4 IMPLANT
SPONGE SURGIFOAM ABS GEL 100C (HEMOSTASIS) IMPLANT
STAPLER VISISTAT 35W (STAPLE) ×4 IMPLANT
STOCKINETTE 6  STRL (DRAPES) ×2
STOCKINETTE 6 STRL (DRAPES) ×2 IMPLANT
SUT ETHILON 3 0 FSL (SUTURE) ×4 IMPLANT
SUT ETHILON 3 0 PS 1 (SUTURE) IMPLANT
SUT NURALON 4 0 TR CR/8 (SUTURE) ×4 IMPLANT
SUT VIC AB 0 CT1 18XCR BRD8 (SUTURE) ×2 IMPLANT
SUT VIC AB 0 CT1 8-18 (SUTURE) ×3
SUT VIC AB 2-0 CT1 18 (SUTURE) ×8 IMPLANT
SUT VICRYL RAPIDE 3/0 (SUTURE) ×8 IMPLANT
SYR 30ML LL (SYRINGE) ×12 IMPLANT
TOWEL GREEN STERILE (TOWEL DISPOSABLE) ×4 IMPLANT
TOWEL GREEN STERILE FF (TOWEL DISPOSABLE) ×4 IMPLANT
TRAY FOLEY W/METER SILVER 16FR (SET/KITS/TRAYS/PACK) ×4 IMPLANT
TUBE CONNECTING 12'X1/4 (SUCTIONS) ×1
TUBE CONNECTING 12X1/4 (SUCTIONS) ×3 IMPLANT
UNDERPAD 30X30 (UNDERPADS AND DIAPERS) ×4 IMPLANT
WATER STERILE IRR 1000ML POUR (IV SOLUTION) ×4 IMPLANT

## 2017-08-04 NOTE — Progress Notes (Signed)
RN received call from MRI tech regarding the only scanner capable of doing the "brainlab protocol" scan being down at this time. RN notified PA of the situation and was informed to wait until 0500/0600 to see if scanner would be working - if not, get a normal MRI scan. Information passed on to the MRI tech and RN will wait to hear from them.  Will continue to monitor closely.  Guadalupe Maple, RN

## 2017-08-04 NOTE — Anesthesia Procedure Notes (Signed)
Procedure Name: Intubation Date/Time: 08/04/2017 2:16 PM Performed by: Imagene Riches, CRNA Pre-anesthesia Checklist: Patient identified, Emergency Drugs available, Suction available and Patient being monitored Patient Re-evaluated:Patient Re-evaluated prior to induction Oxygen Delivery Method: Circle System Utilized Preoxygenation: Pre-oxygenation with 100% oxygen Induction Type: IV induction Ventilation: Mask ventilation without difficulty Laryngoscope Size: Mac and 3 Grade View: Grade I Tube type: Oral Tube size: 7.0 mm Number of attempts: 1 Airway Equipment and Method: Stylet and Oral airway Placement Confirmation: ETT inserted through vocal cords under direct vision,  positive ETCO2 and breath sounds checked- equal and bilateral Secured at: 21 cm Tube secured with: Tape Dental Injury: Teeth and Oropharynx as per pre-operative assessment

## 2017-08-04 NOTE — Anesthesia Preprocedure Evaluation (Signed)
Anesthesia Evaluation  Patient identified by MRN, date of birth, ID band Patient awake    Reviewed: Allergy & Precautions, NPO status , Patient's Chart, lab work & pertinent test results  History of Anesthesia Complications Negative for: history of anesthetic complications  Airway Mallampati: II  TM Distance: >3 FB Neck ROM: Full    Dental  (+) Teeth Intact, Edentulous Upper   Pulmonary asthma , COPD, Current Smoker,  H/o lung Ca   breath sounds clear to auscultation       Cardiovascular negative cardio ROS   Rhythm:Regular     Neuro/Psych  Headaches, Right frontal brain mass negative psych ROS   GI/Hepatic Neg liver ROS, hiatal hernia, GERD  Medicated,  Endo/Other  negative endocrine ROS  Renal/GU negative Renal ROS     Musculoskeletal  (+) Arthritis ,   Abdominal   Peds  Hematology negative hematology ROS (+)   Anesthesia Other Findings   Reproductive/Obstetrics                             Anesthesia Physical Anesthesia Plan  ASA: III  Anesthesia Plan: General   Post-op Pain Management:    Induction: Intravenous  PONV Risk Score and Plan: 2 and Ondansetron, Dexamethasone and Treatment may vary due to age or medical condition  Airway Management Planned: Oral ETT  Additional Equipment: Arterial line, Ultrasound Guidance Line Placement and CVP  Intra-op Plan:   Post-operative Plan: Extubation in OR  Informed Consent: I have reviewed the patients History and Physical, chart, labs and discussed the procedure including the risks, benefits and alternatives for the proposed anesthesia with the patient or authorized representative who has indicated his/her understanding and acceptance.   Dental advisory given  Plan Discussed with: CRNA and Surgeon  Anesthesia Plan Comments:         Anesthesia Quick Evaluation

## 2017-08-04 NOTE — Progress Notes (Signed)
During initial assessment, found pt's arterial line to be dampened and remained with that waveform after re-zeroing, leveling and flushing/pulling back on the line. It will read appropriately for a few minutes and then dampen out again. Assessing BP via cuff pressures.  Guadalupe Maple, RN

## 2017-08-04 NOTE — Anesthesia Procedure Notes (Signed)
Central Venous Catheter Insertion Performed by: Oleta Mouse, MD, anesthesiologist Start/End1/25/2019 1:40 PM, 08/04/2017 1:44 PM Patient location: Pre-op. Preanesthetic checklist: patient identified, IV checked, site marked, risks and benefits discussed, surgical consent, monitors and equipment checked, pre-op evaluation, timeout performed and anesthesia consent Lidocaine 1% used for infiltration and patient sedated Hand hygiene performed  and maximum sterile barriers used  Catheter size: 8 Fr Total catheter length 16. Central line was placed.Double lumen Procedure performed using ultrasound guided technique. Ultrasound Notes:image(s) printed for medical record Attempts: 1 Following insertion, dressing applied, line sutured and Biopatch. Post procedure assessment: blood return through all ports, free fluid flow and no air  Patient tolerated the procedure well with no immediate complications.

## 2017-08-04 NOTE — H&P (Signed)
CC:  No chief complaint on file. Right frontal brain tumor  HPI: Melissa [name] is a 52 y.o. female with a right frontal brain tumor who presents for resection.  She is asymptomatic aside from headaches. No changes since she was seen in clinic.  PMH: Past Medical History:  Diagnosis Date  . Anemia   . Arthritis    knees and hips  . Asthma   . Bronchiolitis   . Cancer [facility])    breast cancer 2014  . Complication of anesthesia    bp dropped + desat   . COPD (chronic obstructive pulmonary disease) ([place])   . GERD (gastroesophageal reflux disease)   . H/O coccidioidomycosis    was reason for lung lobectomy  . Headache(784.0)    due to eye strain or not eating  . History of anemia    no current problem  . History of asthma    as a child  . History of breast cancer 2014   left  . History of chemotherapy    finished 07/17/2013  . History of hiatal hernia   . Hx of radiation therapy 03/25/13-05/06/13   left breast 5000 cGy/25 sessions, left breast boost 1000 cGy/5 sessions  . Runny nose 07/30/2013   clear drainage  . Wears dentures    upper    PSH: Past Surgical History:  Procedure Laterality Date  . AXILLARY LYMPH NODE DISSECTION Left 02/08/2013   Procedure: LEFT AXILLARY DISSECTION;  Surgeon: Edward Jolly, MD;  Location: [place];  Service: General;  Laterality: Left;  . BREAST CYST EXCISION Right 2006  . BREAST LUMPECTOMY WITH NEEDLE LOCALIZATION AND AXILLARY SENTINEL LYMPH NODE BX Left 12/31/2012   Procedure: NEEDLE LOCALIZATION LEFT BREAST LUMPECTOMY AND LEFT AXILLARY SENTENIAL LYMPH NODE BX;  Surgeon: Edward Jolly, MD;  Location: [place];  Service: General;  Laterality: Left;  . CESAREAN SECTION  1995/1996  . LUNG LOBECTOMY Left 05/1996   upper lobe - due to [facility] Fever  . PORT-A-CATH REMOVAL Right 08/02/2013   Procedure: REMOVAL PORT-A-CATH;  Surgeon: Edward Jolly, MD;  Location: [place];  Service:  General;  Laterality: Right;  . PORTACATH PLACEMENT  07/02/2012   Procedure: INSERTION PORT-A-CATH;  Surgeon: Edward Jolly, MD;  Location: [place];  Service: General;  Laterality: N/A;  right    SH: Social History   Tobacco Use  . Smoking status: Current Every Day Smoker    Packs/day: 0.50    Years: 24.00    Pack years: 12.00    Types: Cigarettes  . Smokeless tobacco: Never Used  Substance Use Topics  . Alcohol use: No  . Drug use: No    MEDS: Prior to Admission medications   Medication Sig Start Date End Date Taking? Authorizing Provider  albuterol (VENTOLIN HFA) 108 (90 BASE) MCG/ACT inhaler Inhale 2 puffs into the lungs every 6 (six) hours as needed. 12/20/12  Yes Causey, Charlestine Massed, NP  budesonide-formoterol (SYMBICORT) 160-4.5 MCG/ACT inhaler Inhale 2 puffs into the lungs daily.   Yes [provider]  dexamethasone (DECADRON) 4 MG tablet Take 1 tablet (4 mg total) by mouth every 6 (six) hours. 07/29/17  Yes Rai, Ripudeep K, MD  famotidine (PEPCID) 20 MG tablet One at bedtime Patient taking differently: Take 20 mg by mouth at bedtime.  08/09/16  Yes Tanda Rockers, MD  naproxen sodium (ALEVE) 220 MG tablet Take 220 mg by mouth 2 (two) times daily as needed (  headache).    Yes [provider]  pantoprazole (PROTONIX) 40 MG tablet Take 1 tablet (40 mg total) by mouth daily before breakfast. 07/29/17  Yes Rai, Ripudeep K, MD  pseudoephedrine (SUDAFED) 30 MG tablet Take 30 mg by mouth every 4 (four) hours as needed for congestion.   Yes [provider]  tamoxifen (NOLVADEX) 20 MG tablet TAKE 1 TABLET BY MOUTH DAILY 11/08/16  Yes Nicholas Lose, MD  HYDROcodone-acetaminophen (NORCO/VICODIN) 5-325 MG tablet Take 1 tablet by mouth every 8 (eight) hours as needed for severe pain (headache). 07/29/17   Rai, Ripudeep K, MD  nicotine (NICODERM CQ - DOSED IN MG/24 HOURS) 21 mg/24hr patch Place 1 patch (21 mg total) onto the skin daily.  07/29/17   Rai, Ripudeep K, MD  ondansetron (ZOFRAN ODT) 4 MG disintegrating tablet Take 1 tablet (4 mg total) by mouth every 8 (eight) hours as needed for nausea or vomiting. 07/29/17   Mendel Corning, MD  Respiratory Therapy Supplies (FLUTTER) DEVI Use as directed 08/09/16   Tanda Rockers, MD    ALLERGY: Allergies  Allergen Reactions  . Aspirin Anaphylaxis    THROAT CLOSES  . Iodinated Diagnostic Agents Rash    ROS: ROS  NEUROLOGIC EXAM: Awake, alert, oriented Memory and concentration grossly intact Speech fluent, appropriate CN grossly intact Motor exam: Upper Extremities Deltoid Bicep Tricep Grip  Right 5/5 5/5 5/5 5/5  Left 5/5 5/5 5/5 5/5   Lower Extremity IP Quad PF DF EHL  Right 5/5 5/5 5/5 5/5 5/5  Left 5/5 5/5 5/5 5/5 5/5   Sensation grossly intact to LT  IMAGING: No new imaging  IMPRESSION: - 52 y.o. female with a right frontal brain tumor.  Neurologically intact.  PLAN: - Right frontal craniotomy for tumor resection - We have discussed the risks, benefits, and alternatives to surgery and she wishes to proceed.

## 2017-08-04 NOTE — Transfer of Care (Signed)
Immediate Anesthesia Transfer of Care Note  Patient: Melissa [name]  Procedure(s) Performed: Right Frontal craniotomy for resection of tumor with stereotactic navigation (Right ) APPLICATION OF CRANIAL NAVIGATION (N/A )  Patient Location: PACU  Anesthesia Type:General  Level of Consciousness: awake, alert  and oriented  Airway & Oxygen Therapy: Patient Spontanous Breathing and Patient connected to nasal cannula oxygen  Post-op Assessment: Report given to RN and Post -op Vital signs reviewed and stable  Post vital signs: Reviewed and stable  Last Vitals:  Vitals:   08/04/17 1212  BP: 126/68  Pulse: 65  Resp: 18  Temp: 37 C  SpO2: 100%    Last Pain:  Vitals:   08/04/17 1212  TempSrc: Oral  PainSc:          Complications: No apparent anesthesia complications

## 2017-08-04 NOTE — Brief Op Note (Signed)
08/04/2017  4:37 PM  PATIENT:  Craig Guess Dubs  52 y.o. female  PRE-OPERATIVE DIAGNOSIS:  brain tumor  POST-OPERATIVE DIAGNOSIS:  brain tumor  PROCEDURE:  Procedure(s) with comments: Right Frontal craniotomy for resection of tumor with stereotactic navigation (Right) - Right Frontal craniotomy for resection of tumor with stereotactic navigation APPLICATION OF CRANIAL NAVIGATION (N/A)  SURGEON:  Surgeon(s) and Role:    * Haiden Clucas, [name], MD - Primary  PHYSICIAN ASSISTANT:   ASSISTANTS: Ferne Reus, PA-C   ANESTHESIA:   general  EBL:  200 mL   BLOOD ADMINISTERED:none  DRAINS: JP drain   LOCAL MEDICATIONS USED:  MARCAINE    and LIDOCAINE   SPECIMEN:  No Specimen and Excision  DISPOSITION OF SPECIMEN:  PATHOLOGY  COUNTS:  YES  TOURNIQUET:  * No tourniquets in log *  DICTATION: .Dragon Dictation  PLAN OF CARE: Admit to inpatient   PATIENT DISPOSITION:  ICU - extubated and stable.   Delay start of Pharmacological VTE agent (>24hrs) due to surgical blood loss or risk of bleeding: yes

## 2017-08-04 NOTE — Anesthesia Procedure Notes (Signed)
Arterial Line Insertion Start/End1/25/2019 1:15 PM, 08/04/2017 1:30 PM Performed by: Verdie Drown, CRNA, CRNA  Patient location: Pre-op. Preanesthetic checklist: patient identified, IV checked, site marked, risks and benefits discussed, surgical consent, monitors and equipment checked, pre-op evaluation, timeout performed and anesthesia consent Right, radial was placed Catheter size: 20 G Hand hygiene performed  and maximum sterile barriers used   Attempts: 1 Procedure performed without using ultrasound guided technique. Following insertion, Biopatch and dressing applied.

## 2017-08-05 ENCOUNTER — Inpatient Hospital Stay (HOSPITAL_COMMUNITY): Payer: 59

## 2017-08-05 LAB — BASIC METABOLIC PANEL
Anion gap: 10 (ref 5–15)
BUN: 17 mg/dL (ref 6–20)
CHLORIDE: 104 mmol/L (ref 101–111)
CO2: 23 mmol/L (ref 22–32)
CREATININE: 0.77 mg/dL (ref 0.44–1.00)
Calcium: 8.1 mg/dL — ABNORMAL LOW (ref 8.9–10.3)
Glucose, Bld: 134 mg/dL — ABNORMAL HIGH (ref 65–99)
Potassium: 3.9 mmol/L (ref 3.5–5.1)
SODIUM: 137 mmol/L (ref 135–145)

## 2017-08-05 LAB — CBC
HCT: 32.3 % — ABNORMAL LOW (ref 36.0–46.0)
Hemoglobin: 11 g/dL — ABNORMAL LOW (ref 12.0–15.0)
MCH: 32.3 pg (ref 26.0–34.0)
MCHC: 34.1 g/dL (ref 30.0–36.0)
MCV: 94.7 fL (ref 78.0–100.0)
PLATELETS: 197 10*3/uL (ref 150–400)
RBC: 3.41 MIL/uL — AB (ref 3.87–5.11)
RDW: 14 % (ref 11.5–15.5)
WBC: 16.6 10*3/uL — AB (ref 4.0–10.5)

## 2017-08-05 MED ORDER — DEXAMETHASONE 4 MG PO TABS: 4.0000 mg | ORAL_TABLET | Freq: Two times a day (BID) | ORAL | 3 refills | Status: DC

## 2017-08-05 MED ORDER — HYDROCODONE-ACETAMINOPHEN 5-325 MG PO TABS: 1.0000 | ORAL_TABLET | ORAL | 0 refills | Status: DC | PRN

## 2017-08-05 MED ORDER — GADOBENATE DIMEGLUMINE 529 MG/ML IV SOLN
11.0000 mL | Freq: Once | INTRAVENOUS | Status: AC | PRN
  Administered 2017-08-05: 11 mL via INTRAVENOUS

## 2017-08-05 NOTE — Op Note (Signed)
08/04/2017  9:07 AM  PATIENT:  Melissa [name]  52 y.o. female  PRE-OPERATIVE DIAGNOSIS:  Right frontal brain metastasis  POST-OPERATIVE DIAGNOSIS:  Same  PROCEDURE:  Right frontal craniotomy for resection of intraaxial tumor; use of stereotactic navigation; microsurgical technique requiring use of operating microscope  SURGEON:  Aldean Ast, MD  ASSISTANTS: Ferne Reus, PA-C  ANESTHESIA:   General  DRAINS: JP drain   SPECIMEN:  None  INDICATION FOR PROCEDURE: 53 year old woman with history of breast cancer and newly diagnosed right frontal metastasis.  I recommended the above operation. Patient understood the risks, benefits, and alternatives and potential outcomes and wished to proceed.  PROCEDURE DETAILS: After smooth induction of general endotracheal anesthesia the patient was positioned in the supine position with the head fixated in [place] pins in neutral position.  BrainLab navigation was registered using external landmarks.  The incision was planned.  It was wiped with alcohol and injected with lidocaine and marcaine with epi. The patient was prepped and draped in the usual sterile fashion.  A right frontal incision was made which extended across the midline to provide adequate anterior exposure.  The scalp was elevated and reflected anteriorly.  The BrainLab was used to identify the frontal sinuses and the craniotomy was planned. I elected to perform the craniotomy across the superior sagittal sinus so as to have control of it.  Burr holes were drilled and the underlying dura was separated from the inner table of the skull.  A drill with foot plate attachment was used to make a right frontal craniotomy.  The frontal sinus was not violated.    The Brainlab was used to plan the durotomy.  Durotomy was made anteriorly in a C-shaped fashion and reflected towards the sinus.  Tumor was visible.  I used bipolar cautery to coagulate the margin of the tumor.  I was then able to  bluntly dissect the circumference of the tumor.  There were several vascular attachments which were coagulated and sharply cut.  The tumor was removed en bloc and sent for permanent analysis with pathology.  The microscope was employed to provide light and magnification to inspect for residual tumor and obtain hemostasis.  There was no additional tumor identified.  Hemostasis was obtained with a combination of topical hemostatic agents and bipolar cautery.  There was excellent hemostasis.  I placed surgicel on the periphery of the resection cavity.  Dura was approximated with nurolon sutures.  The craniotomy flap was replaced and fixated with titanium screws.  A JP drain was placed and tunneled to a separate exit site.  The galea was closed with interrupted vicryl sutures.  The scalp was closed with a running vicryl Rapide suture.  A sterile dressing was applied.  The patient was removed from pins without incident.  She awoke without difficulty.   PATIENT DISPOSITION:  ICU - extubated and stable.   Delay start of Pharmacological VTE agent (>24hrs) due to surgical blood loss or risk of bleeding:  yes

## 2017-08-05 NOTE — Discharge Summary (Signed)
Date of Admission: 08/04/2017  Date of Discharge: 08/05/17  Admission Diagnosis: Right frontal brain metastasis  Discharge Diagnosis: Same  Procedure Performed: Right frontal craniotomy for resection of intraaxial tumor; use of stereotactic navigation; microsurgical technique requiring use of operating microscope  Attending: [name], [name], MD  Hospital Course:  The patient was admitted for the above listed operation and had an uncomplicated post-operative course.  They were discharged in stable condition.  Follow up: 3 weeks  Allergies as of 08/05/2017      Reactions   Aspirin Anaphylaxis   THROAT CLOSES   Iodinated Diagnostic Agents Rash      Medication List    TAKE these medications   albuterol 108 (90 Base) MCG/ACT inhaler Commonly known as:  VENTOLIN HFA Inhale 2 puffs into the lungs every 6 (six) hours as needed.   budesonide-formoterol 160-4.5 MCG/ACT inhaler Commonly known as:  SYMBICORT Inhale 2 puffs into the lungs daily.   dexamethasone 4 MG tablet Commonly known as:  DECADRON Take 1 tablet (4 mg total) by mouth 2 (two) times daily. What changed:  when to take this   famotidine 20 MG tablet Commonly known as:  PEPCID One at bedtime What changed:    how much to take  how to take this  when to take this  additional instructions   FLUTTER Devi Use as directed   HYDROcodone-acetaminophen 5-325 MG tablet Commonly known as:  NORCO/VICODIN Take 1-2 tablets by mouth every 4 (four) hours as needed for severe pain (headache). What changed:    how much to take  when to take this   naproxen sodium 220 MG tablet Commonly known as:  ALEVE Take 220 mg by mouth 2 (two) times daily as needed (headache).   nicotine 21 mg/24hr patch Commonly known as:  NICODERM CQ - dosed in mg/24 hours Place 1 patch (21 mg total) onto the skin daily.   ondansetron 4 MG disintegrating tablet Commonly known as:  ZOFRAN ODT Take 1 tablet (4 mg total) by mouth  every 8 (eight) hours as needed for nausea or vomiting.   pantoprazole 40 MG tablet Commonly known as:  PROTONIX Take 1 tablet (40 mg total) by mouth daily before breakfast.   pseudoephedrine 30 MG tablet Commonly known as:  SUDAFED Take 30 mg by mouth every 4 (four) hours as needed for congestion.   tamoxifen 20 MG tablet Commonly known as:  NOLVADEX TAKE 1 TABLET BY MOUTH DAILY

## 2017-08-05 NOTE — Progress Notes (Signed)
RN reviewed discharge instructions with pt., her husband, Gerald Stabs and daughter present. Prescriptions given to pt. All questions answered. F/U appointment with Neurosurgery scheduled. PIV removed. Pt discharged to home with all belongings.

## 2017-08-05 NOTE — Progress Notes (Signed)
No acute events Awake, alert, oriented Moving arms and legs well Dressing c/d/i MRI independently reviewed, looks good, no residual tumor D/c drain D/c home if ambulating well this afternoon

## 2017-08-05 NOTE — Progress Notes (Signed)
Nutrition Brief Note  Patient identified on the Malnutrition Screening Tool (MST) Report Pt reports that her weight has been stable for > 1 year. She has a good appetite and eats a healthy diet at home. She had her last chemo 4 years ago but has been on tamoxifen.  She is s/p R crani for metastasis. She reports she may go home today. Pt tolerated clear liquids well today.   Wt Readings from Last 15 Encounters:  08/04/17 118 lb (53.5 kg)  08/03/17 118 lb 9.7 oz (53.8 kg)  07/27/17 113 lb 15.7 oz (51.7 kg)  06/20/17 119 lb (54 kg)  06/16/17 119 lb (54 kg)  02/27/17 116 lb (52.6 kg)  11/25/16 114 lb (51.7 kg)  08/11/16 116 lb 9.6 oz (52.9 kg)  08/09/16 118 lb (53.5 kg)  07/19/16 115 lb (52.2 kg)  07/18/16 115 lb (52.2 kg)  07/15/16 118 lb (53.5 kg)  06/13/16 118 lb 9.6 oz (53.8 kg)  08/11/15 122 lb (55.3 kg)  02/10/15 118 lb (53.5 kg)    Body mass index is 21.58 kg/m.   Current diet order is Clear Liquids, patient is consuming approximately 100% of meals at this time. Labs and medications reviewed.   No nutrition interventions warranted at this time. If nutrition issues arise, please consult RD.   Pars[place], [place], [place] Pager [phone] After Hours Pager

## 2017-08-05 NOTE — Progress Notes (Signed)
Drained removed Closed with staples. Pt tolerated well. No complication

## 2017-08-05 NOTE — Evaluation (Signed)
Physical Therapy Evaluation Patient Details Name: Melissa [name] MRN: [id number] DOB: 26-Aug-1965 Today's Date: 08/05/2017   History of Present Illness  52 y.o. female admitted on 08/04/17 for R frontal craniotomy for removal of brain tumor. Pt with significant PMH of Breast CA with L breast radiation and chemo, COPD, asthma, anemia, L lung lobectomy, L axillary lymph node dissection, lumpectomy.   Clinical Impression  Pt is POD #1 s/p crani and is moving well, min guard assist for all mobility and gait around the unit.  She has a very supportive family who will provide 24/7 care.  From a mobility standpoint she is safe to return home with their supervision and assist.  PT will continue to follow acutely for deficits listed below until her d/c is confirmed.     Follow Up Recommendations No PT follow up    Equipment Recommendations  None recommended by PT    Recommendations for Other Services   NA    Precautions / Restrictions   None     Mobility  Bed Mobility Overal bed mobility: Needs Assistance Bed Mobility: Supine to Sit     Supine to sit: Min guard     General bed mobility comments: Min guard assist for balance.   Transfers Overall transfer level: Needs assistance Equipment used: 1 person hand held assist Transfers: Sit to/from Stand Sit to Stand: Min guard         General transfer comment: Min guard assist for safety and balance.  Pt encouraged to both sit for a minute and stand for a minute to make sure she was not too lightheaded before continuing on with gait.   Ambulation/Gait Ambulation/Gait assistance: Min guard Ambulation Distance (Feet): 250 Feet Assistive device: (IV pole) Gait Pattern/deviations: Step-through pattern Gait velocity: decreased Gait velocity interpretation: Below normal speed for age/gender General Gait Details: slow, cautious gait pattern, holding IV pole lightly for support.  No real signs of imbalance.       Balance Overall balance  assessment: Needs assistance Sitting-balance support: No upper extremity supported;Feet supported Sitting balance-Leahy Scale: Good     Standing balance support: No upper extremity supported;Single extremity supported Standing balance-Leahy Scale: Fair                               Pertinent Vitals/Pain Pain Assessment: Faces Faces Pain Scale: Hurts little more Pain Location: head since drain removal this AM Pain Descriptors / Indicators: Aching Pain Intervention(s): Limited activity within patient's tolerance;Monitored during session;Repositioned    Home Living Family/patient expects to be discharged to:: Private residence Living Arrangements: Spouse/significant other Available Help at Discharge: Family;Available 24 hours/day Type of Home: House Home Access: Stairs to enter Entrance Stairs-Rails: Right;Left;Can reach both Entrance Stairs-Number of Steps: 4 Home Layout: One level Home Equipment: Cane - single point;Shower seat - built in      Prior Function Level of Independence: Independent         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   Dominant Hand: Left    Extremity/Trunk Assessment   Upper Extremity Assessment Upper Extremity Assessment: Overall WFL for tasks assessed    Lower Extremity Assessment Lower Extremity Assessment: Generalized weakness(due to post op shakiness)    Cervical / Trunk Assessment Cervical / Trunk Assessment: Normal  Communication   Communication: No difficulties  Cognition Arousal/Alertness: Awake/alert Behavior During Therapy: WFL for tasks assessed/performed Overall Cognitive Status: Within Functional Limits  for tasks assessed                                               Assessment/Plan    PT Assessment Patient needs continued PT services  PT Problem List Decreased strength;Decreased activity tolerance;Decreased balance;Decreased mobility;Pain       PT  Treatment Interventions DME instruction;Gait training;Stair training;Functional mobility training;Therapeutic activities;Therapeutic exercise;Balance training;Patient/family education    PT Goals (Current goals can be found in the Care Plan section)  Acute Rehab PT Goals Patient Stated Goal: to get back to work and life  PT Goal Formulation: With patient Time For Goal Achievement: 08/19/17 Potential to Achieve Goals: Good    Frequency Min 4X/week           AM-PAC PT "6 Clicks" Daily Activity  Outcome Measure Difficulty turning over in bed (including adjusting bedclothes, sheets and blankets)?: A Little Difficulty moving from lying on back to sitting on the side of the bed? : Unable Difficulty sitting down on and standing up from a chair with arms (e.g., wheelchair, bedside commode, etc,.)?: Unable Help needed moving to and from a bed to chair (including a wheelchair)?: A Little Help needed walking in hospital room?: A Little Help needed climbing 3-5 steps with a railing? : A Little 6 Click Score: 14    End of Session Equipment Utilized During Treatment: Gait belt Activity Tolerance: Patient limited by fatigue;Patient limited by pain Patient left: in chair;with call bell/phone within reach;with family/visitor present   PT Visit Diagnosis: Muscle weakness (generalized) (M62.81);Pain Pain - part of body: (head)    Time: 9417-4081 PT Time Calculation (min) (ACUTE ONLY): 21 min   Charges:         Wells Guiles B. Leisl Spurrier, PT, DPT [phone]   PT Evaluation $PT Eval Moderate Complexity: 1 Mod     08/05/2017, 10:06 AM

## 2017-08-07 ENCOUNTER — Emergency Department (HOSPITAL_COMMUNITY)
Admission: EM | Admit: 2017-08-07 | Discharge: 2017-08-08 | Disposition: A | Discharge status: Home / Self Care | Payer: 59 | Attending: Emergency Medicine | Admitting: Emergency Medicine

## 2017-08-07 ENCOUNTER — Other Ambulatory Visit: Payer: Self-pay

## 2017-08-07 ENCOUNTER — Emergency Department (HOSPITAL_COMMUNITY): Payer: 59

## 2017-08-07 ENCOUNTER — Encounter (HOSPITAL_COMMUNITY): Payer: Self-pay | Admitting: Neurological Surgery

## 2017-08-07 DIAGNOSIS — J45909 Unspecified asthma, uncomplicated: Secondary | ICD-10-CM | POA: Insufficient documentation

## 2017-08-07 DIAGNOSIS — J449 Chronic obstructive pulmonary disease, unspecified: Secondary | ICD-10-CM | POA: Diagnosis not present

## 2017-08-07 DIAGNOSIS — R55 Syncope and collapse: Secondary | ICD-10-CM | POA: Diagnosis present

## 2017-08-07 DIAGNOSIS — Z79899 Other long term (current) drug therapy: Secondary | ICD-10-CM | POA: Insufficient documentation

## 2017-08-07 DIAGNOSIS — F17210 Nicotine dependence, cigarettes, uncomplicated: Secondary | ICD-10-CM | POA: Insufficient documentation

## 2017-08-07 MED ORDER — SODIUM CHLORIDE 0.9 % IV BOLUS (SEPSIS)
1000.0000 mL | Freq: Once | INTRAVENOUS | Status: AC
  Administered 2017-08-07: 1000 mL via INTRAVENOUS

## 2017-08-07 MED FILL — Thrombin For Soln 5000 Unit: CUTANEOUS | Qty: 5000 | Status: AC

## 2017-08-07 MED FILL — Thrombin For Soln 20000 Unit: CUTANEOUS | Qty: 1 | Status: AC

## 2017-08-07 NOTE — ED Triage Notes (Signed)
Patient form home with hx of recent tumor removal discharged on 26th of Janurary.  Restricted on the left side for lobectomy.  At home at a near syncopal episode, with no loc but family did ease patient to the floor.  Did not hit head.  Patient is A&Ox4 vitals stable

## 2017-08-07 NOTE — Anesthesia Postprocedure Evaluation (Signed)
Anesthesia Post Note  Patient: Melissa [name]  Procedure(s) Performed: Right Frontal craniotomy for resection of tumor with stereotactic navigation (Right ) APPLICATION OF CRANIAL NAVIGATION (N/A )     Patient location during evaluation: PACU Anesthesia Type: General Level of consciousness: awake and patient cooperative Pain management: pain level controlled Vital Signs Assessment: post-procedure vital signs reviewed and stable Respiratory status: spontaneous breathing, nonlabored ventilation, respiratory function stable and patient connected to nasal cannula oxygen Cardiovascular status: blood pressure returned to baseline and stable Postop Assessment: no apparent nausea or vomiting Anesthetic complications: no    Last Vitals:  Vitals:   08/05/17 1700 08/05/17 1800  BP: 114/64 101/63  Pulse: 71 72  Resp: 18 15  Temp:    SpO2: 99% 99%    Last Pain:  Vitals:   08/05/17 1700  TempSrc:   PainSc: 7                  [name]

## 2017-08-07 NOTE — ED Provider Notes (Signed)
12:00 AM  Assumed care from Dr. Ashok Cordia.  Patient is a 52 y.o. female with history of breast cancer with metastases to the brain that was recently resected by Dr. Cyndy Freeze with neurosurgery currently on Decadron who presents with near syncopal event at home today.  Normal neuro exam.  Patient has had decreased oral intake.  Blood pressure soft here but appears to be near her baseline.  Labs, chest x-ray, EKG pending.  Will reassess after IV fluids.    EKG Interpretation  Date/Time:  Tuesday August 08 2017 00:13:49 EST Ventricular Rate:  74 PR Interval:    QRS Duration: 95 QT Interval:  403 QTC Calculation: 448 R Axis:   72 Text Interpretation:  Sinus rhythm Borderline short PR interval RSR' in V1 or V2, right VCD or RVH No significant change since last tracing Confirmed by Trinitie Mcgirr, Cyril Mourning [phone]) on 08/08/2017 12:19:21 AM      1:45 AM  Pt's labs are reassuring.  She has a leukocytosis which is likely secondary to Decadron.  No infectious symptoms.  I-STAT hCG slightly elevated but likely erroneous.  She is postmenopausal with last menstrual period in 2014.  Blood glucose normal.  Troponin negative.  Chest x-ray clear shows no infiltrate or edema.  No widened mediastinum or cardiomegaly.  Urine shows no sign of infection or dehydration.  Blood pressure improving with IV hydration.  Patient states at this time that she is only feeling fatigued.  No chest pain or shortness of breath.  Remains neurologically intact.  No seizure-like activity.  Will p.o. challenge patient and ambulate in the ED.  If patient does well she will be discharged home.   2:20 AM  Pt able to drink without difficulty.  She was able to ambulate normally and states she has feeling better would like discharge home.  Blood pressures have improved.  Urine shows no sign of infection.  Recommend increase fluid intake at home and close follow-up with her neurosurgeon and primary care provider.  Patient and family comfortable with this plan.   Discussed at length return precautions.  Suspect likely near syncopal event from dehydration from decreased oral intake.   At this time, I do not feel there is any life-threatening condition present. I have reviewed and discussed all results (EKG, imaging, lab, urine as appropriate) and exam findings with patient/family. I have reviewed nursing notes and appropriate previous records.  I feel the patient is safe to be discharged home without further emergent workup and can continue workup as an outpatient as needed. Discussed usual and customary return precautions. Patient/family verbalize understanding and are comfortable with this plan.  Outpatient follow-up has been provided if needed. All questions have been answered.    Idelle Reimann, Delice Bison, DO 08/08/17 [facility] [place]

## 2017-08-07 NOTE — ED Provider Notes (Signed)
[facility] EMERGENCY DEPARTMENT Provider Note   CSN: [id number] Arrival date & time: 08/07/17  2114     History   Chief Complaint Chief Complaint  Patient presents with  . Near Syncope    HPI DYNVER CLEMSON is a 52 y.o. female.  Patient c/o syncopal episode just prior to arrival tonight. Patient s/p craniotomy w resection met (hx breast ca) to frontal lobe 08/04/17. Patient indicates has done well since surgery. Mild headache since surgery but no acute or abrupt worsening today. This evening had gone outside to smoke, and when was walking in felt faint, lightheaded, flushed, and then family assisted to ground. Denies trauma. No seizure activity noted.  States the steroids she takes are hard on her stomach, so relatively poor po intake since surgery. Denies vomiting or diarrhea. No dysuria or gu c/o. Denies chest pain or sob. No palpitations. Denies fever or chills. States her bp normally runs low, in 90-105 range. No melena or blood loss. No problems w surgical wounds.    The history is provided by the patient.  Near Syncope  Pertinent negatives include no chest pain, no abdominal pain and no shortness of breath.    Past Medical History:  Diagnosis Date  . Anemia   . Arthritis    knees and hips  . Asthma   . Bronchiolitis   . Cancer [facility])    breast cancer 2014  . Complication of anesthesia    bp dropped + desat   . COPD (chronic obstructive pulmonary disease) ([place])   . GERD (gastroesophageal reflux disease)   . H/O coccidioidomycosis    was reason for lung lobectomy  . Headache(784.0)    due to eye strain or not eating  . History of anemia    no current problem  . History of asthma    as a child  . History of breast cancer 2014   left  . History of chemotherapy    finished 07/17/2013  . History of hiatal hernia   . Hx of radiation therapy 03/25/13-05/06/13   left breast 5000 cGy/25 sessions, left breast boost 1000 cGy/5 sessions  . Runny nose  07/30/2013   clear drainage  . Wears dentures    upper    Patient Active Problem List   Diagnosis Date Noted  . Metastasis to brain ([place]) 08/04/2017  . Solitary brain metastasis ([place]) 07/27/2017  . Nicotine abuse 07/27/2017  . Migraines 07/27/2017  . HCAP (healthcare-associated pneumonia) 11/26/2016  . Upper airway cough syndrome 08/10/2016  . Abnormal echocardiogram 08/07/2013  . Chest tightness or pressure 08/07/2013  . Hx of radiation therapy   . Edema of left lower extremity 11/19/2012  . Tachycardia 09/20/2012  . Breast cancer of upper-inner quadrant of left female breast ([place]) 06/08/2012  . Obstructive bronchiectasis ([place]) 01/26/2011  . Cigarette smoker 01/26/2011    Past Surgical History:  Procedure Laterality Date  . APPLICATION OF CRANIAL NAVIGATION N/A 08/04/2017   Procedure: APPLICATION OF CRANIAL NAVIGATION;  Surgeon: [name], [name], MD;  Location: [place];  Service: Neurosurgery;  Laterality: N/A;  . AXILLARY LYMPH NODE DISSECTION Left 02/08/2013   Procedure: LEFT AXILLARY DISSECTION;  Surgeon: Edward Jolly, MD;  Location: [place];  Service: General;  Laterality: Left;  . BREAST CYST EXCISION Right 2006  . BREAST LUMPECTOMY WITH NEEDLE LOCALIZATION AND AXILLARY SENTINEL LYMPH NODE BX Left 12/31/2012   Procedure: NEEDLE LOCALIZATION LEFT BREAST LUMPECTOMY AND LEFT AXILLARY SENTENIAL LYMPH NODE BX;  Surgeon:  Edward Jolly, MD;  Location: [place];  Service: General;  Laterality: Left;  . CESAREAN SECTION  1995/1996  . CRANIOTOMY Right 08/04/2017   Procedure: Right Frontal craniotomy for resection of tumor with stereotactic navigation;  Surgeon: [name], [name], MD;  Location: [place];  Service: Neurosurgery;  Laterality: Right;  Right Frontal craniotomy for resection of tumor with stereotactic navigation  . LUNG LOBECTOMY Left 05/1996   upper lobe - due to [facility] Fever  . PORT-A-CATH REMOVAL Right 08/02/2013    Procedure: REMOVAL PORT-A-CATH;  Surgeon: Edward Jolly, MD;  Location: [place];  Service: General;  Laterality: Right;  . PORTACATH PLACEMENT  07/02/2012   Procedure: INSERTION PORT-A-CATH;  Surgeon: Edward Jolly, MD;  Location: [place];  Service: General;  Laterality: N/A;  right    OB History    No data available       Home Medications    Prior to Admission medications   Medication Sig Start Date End Date Taking? Authorizing Provider  albuterol (VENTOLIN HFA) 108 (90 BASE) MCG/ACT inhaler Inhale 2 puffs into the lungs every 6 (six) hours as needed. 12/20/12   Gardenia Phlegm, NP  budesonide-formoterol (SYMBICORT) 160-4.5 MCG/ACT inhaler Inhale 2 puffs into the lungs daily.    [provider]  dexamethasone (DECADRON) 4 MG tablet Take 1 tablet (4 mg total) by mouth 2 (two) times daily. 08/05/17   [name], [name], MD  famotidine (PEPCID) 20 MG tablet One at bedtime Patient taking differently: Take 20 mg by mouth at bedtime.  08/09/16   Tanda Rockers, MD  HYDROcodone-acetaminophen (NORCO/VICODIN) 5-325 MG tablet Take 1-2 tablets by mouth every 4 (four) hours as needed for severe pain (headache). 08/05/17   [name], [name], MD  naproxen sodium (ALEVE) 220 MG tablet Take 220 mg by mouth 2 (two) times daily as needed (headache).     [provider]  nicotine (NICODERM CQ - DOSED IN MG/24 HOURS) 21 mg/24hr patch Place 1 patch (21 mg total) onto the skin daily. 07/29/17   Rai, Ripudeep K, MD  ondansetron (ZOFRAN ODT) 4 MG disintegrating tablet Take 1 tablet (4 mg total) by mouth every 8 (eight) hours as needed for nausea or vomiting. 07/29/17   Rai, Vernelle Emerald, MD  pantoprazole (PROTONIX) 40 MG tablet Take 1 tablet (40 mg total) by mouth daily before breakfast. 07/29/17   Rai, Ripudeep K, MD  pseudoephedrine (SUDAFED) 30 MG tablet Take 30 mg by mouth every 4 (four) hours as needed for congestion.    [provider]  Respiratory Therapy Supplies (FLUTTER) DEVI Use as directed 08/09/16   Tanda Rockers, MD  tamoxifen (NOLVADEX) 20 MG tablet TAKE 1 TABLET BY MOUTH DAILY 11/08/16   Nicholas Lose, MD    Family History Family History  Problem Relation Age of Onset  . Emphysema Mother        was a smoker  . Heart disease Mother   . Melanoma Mother        dx in her 26s  . Asthma Brother   . Breast cancer Cousin        mother's maternal cousin; dx in her 48s    Social History Social History   Tobacco Use  . Smoking status: Current Every Day Smoker    Packs/day: 0.50    Years: 24.00    Pack years: 12.00    Types: Cigarettes  . Smokeless tobacco: Never Used  Substance Use  Topics  . Alcohol use: No  . Drug use: No     Allergies   Aspirin and Iodinated diagnostic agents   Review of Systems Review of Systems  Constitutional: Negative for fever.  HENT: Negative for sore throat.   Eyes: Negative for visual disturbance.  Respiratory: Negative for cough and shortness of breath.   Cardiovascular: Positive for near-syncope. Negative for chest pain.  Gastrointestinal: Negative for abdominal pain.  Genitourinary: Negative for dysuria and flank pain.  Musculoskeletal: Negative for neck pain.  Skin: Negative for rash.  Neurological: Negative for speech difficulty and numbness.  Hematological: Does not bruise/bleed easily.  Psychiatric/Behavioral: Negative for confusion.     Physical Exam Updated Vital Signs BP (!) 95/56 (BP Location: Right Arm)   Pulse 70   Temp 98.6 F (37 C) (Oral)   Resp 16   Ht 1.575 m ('5\' 2"'$ )   Wt 54.4 kg (120 lb)   LMP 07/25/2012   SpO2 93%   BMI 21.95 kg/m   Physical Exam  Constitutional: She appears well-developed and well-nourished. No distress.  HENT:  Healing surgical incisions to head/scalp, staples intact, no sign of infection to areas.   Eyes: Conjunctivae are normal. Pupils are equal, round, and reactive to light. No scleral icterus.    Neck: Normal range of motion. Neck supple. No tracheal deviation present.  No stiffness or rigidity.   Cardiovascular: Normal rate, regular rhythm, normal heart sounds and intact distal pulses. Exam reveals no gallop and no friction rub.  No murmur heard. Pulmonary/Chest: Effort normal and breath sounds normal. No respiratory distress.  Abdominal: Soft. Normal appearance and bowel sounds are normal. She exhibits no distension. There is no tenderness.  Genitourinary:  Genitourinary Comments: No cva tenderness  Musculoskeletal: She exhibits no edema.  Neurological: She is alert.  Skin: Skin is warm and dry. No rash noted. She is not diaphoretic.  Psychiatric: She has a normal mood and affect.  Nursing note and vitals reviewed.    ED Treatments / Results  Labs (all labs ordered are listed, but only abnormal results are displayed) Results for orders placed or performed during the hospital encounter of 08/04/17  MRSA PCR Screening  Result Value Ref Range   MRSA by PCR NEGATIVE NEGATIVE  Basic metabolic panel  Result Value Ref Range   Sodium 137 135 - 145 mmol/L   Potassium 3.9 3.5 - 5.1 mmol/L   Chloride 104 101 - 111 mmol/L   CO2 23 22 - 32 mmol/L   Glucose, Bld 134 (H) 65 - 99 mg/dL   BUN 17 6 - 20 mg/dL   Creatinine, Ser 0.77 0.44 - 1.00 mg/dL   Calcium 8.1 (L) 8.9 - 10.3 mg/dL   GFR calc non Af Amer >60 >60 mL/min   GFR calc Af Amer >60 >60 mL/min   Anion gap 10 5 - 15  CBC  Result Value Ref Range   WBC 16.6 (H) 4.0 - 10.5 K/uL   RBC 3.41 (L) 3.87 - 5.11 MIL/uL   Hemoglobin 11.0 (L) 12.0 - 15.0 g/dL   HCT 32.3 (L) 36.0 - 46.0 %   MCV 94.7 78.0 - 100.0 fL   MCH 32.3 26.0 - 34.0 pg   MCHC 34.1 30.0 - 36.0 g/dL   RDW 14.0 11.5 - 15.5 %   Platelets 197 150 - 400 K/uL   Ct Head Wo Contrast  Result Date: 07/27/2017 CLINICAL DATA:  Intermittent headaches. EXAM: CT HEAD WITHOUT CONTRAST TECHNIQUE: Contiguous axial images were obtained from  the base of the skull through the  vertex without intravenous contrast. COMPARISON:  None. FINDINGS: Brain: Within the anterior right frontal lobe there is a mass measuring 3.1 by 2.5 cm, image 15 of series 3. There is a large area of surrounding low attenuation edema involving much of the right frontal lobe. Low attenuation edema appears to extend into the corpus callosum and may cross the midline. There is significant mass effect with right to left midline shift measuring 8 mm. No acute intracranial hemorrhage identified.  No hydrocephalus. Vascular: No hyperdense vessel or unexpected calcification. Skull: The calvarium appears intact. Sinuses/Orbits: Paranasal sinuses and mastoid air cells are clear. Other: None IMPRESSION: 1. Large right frontal lobe mass is identified. This may represent a primary CNS neoplasm or focus of metastatic disease. There is significant surrounding low attenuation edema with mass effect and right to late midline shift measuring 8 mm. Further investigation with contrast enhanced brain MRI is advised. Critical Value/emergent results were called by telephone at the time of interpretation on 07/27/2017 at 3:28 pm to Geanie Kenning, PA, who verbally acknowledged these results. Electronically Signed   By: Kerby Moors M.D.   On: 07/27/2017 15:28   Ct Chest W Contrast  Result Date: 07/28/2017 CLINICAL DATA:  Restaging breast cancer. Brain mass seen on MRI examination yesterday. EXAM: CT CHEST, ABDOMEN, AND PELVIS WITH CONTRAST TECHNIQUE: Multidetector CT imaging of the chest, abdomen and pelvis was performed following the standard protocol during bolus administration of intravenous contrast. CONTRAST:  118m ISOVUE-300 IOPAMIDOL (ISOVUE-300) INJECTION 61% COMPARISON:  CT chest 07/19/2016 and CT abdomen/pelvis 07/18/2017 FINDINGS: CT CHEST FINDINGS Cardiovascular: The heart is normal in size and stable. No pericardial effusion. The aorta and branch vessels are patent. No dissection. Mediastinum/Nodes: No mediastinal or  hilar mass or lymphadenopathy. The esophagus is grossly normal. Lungs/Pleura: Surgical changes from a prior left upper lobe lobectomy. There also radiation changes involving the anterior aspect of the left lung. No worrisome pulmonary nodules to suggest pulmonary metastatic disease. Stable emphysematous changes and areas of bronchiectasis. Left upper lobe bronchiectasis with fluid-filled bronchi are noted. No pleural effusion. No pleural lesions. Musculoskeletal: Surgical changes involving the left breast and left axilla. No findings suspicious for recurrent tumor. No supraclavicular or axillary lymphadenopathy. No worrisome lytic or sclerotic bone lesions to suggest metastatic disease. CT ABDOMEN PELVIS FINDINGS Hepatobiliary: No hepatic lesions to suggest metastasis. The gallbladder demonstrates layering high attenuation material, likely sludge, stones or both. No common bile duct dilatation. Pancreas: No mass, inflammation or ductal dilatation. Spleen: Normal size.  No focal lesions. Adrenals/Urinary Tract: The adrenal glands and kidneys are unremarkable. No findings for metastatic disease. No hydronephrosis. The bladder is normal. Stomach/Bowel: The stomach, duodenum, small bowel and colon are grossly normal without oral contrast. No inflammatory changes, mass lesions or obstructive findings. The appendix is normal. Vascular/Lymphatic: The aorta and branch vessels are patent. The major venous structures are patent. Circumaortic left renal vein noted. No mesenteric or retroperitoneal mass or adenopathy. Reproductive: The uterus and ovaries are unremarkable. The uterus is retroverted. Other: No pelvic mass or adenopathy. No free pelvic fluid collections. No inguinal mass or adenopathy. No abdominal wall hernia or subcutaneous lesions. Musculoskeletal: No worrisome bone lesions to suggest metastatic disease. IMPRESSION: 1. No findings to suggest metastatic breast cancer involving the chest, abdomen, pelvis or  osseous structures. 2. Stable emphysematous changes and areas of bronchiectasis. 3. Stable surgical changes from a left upper lobe lobectomy. 4. Surgical changes involving the left breast and left axilla  but no findings for local recurrence or regional adenopathy. Electronically Signed   By: Marijo Sanes M.D.   On: 07/28/2017 11:39   Mr Jeri Cos GG Contrast  Result Date: 08/05/2017 CLINICAL DATA:  Follow-up examination status post craniotomy for tumor resection. EXAM: MRI HEAD WITHOUT AND WITH CONTRAST TECHNIQUE: Multiplanar, multiecho pulse sequences of the brain and surrounding structures were obtained without and with intravenous contrast. CONTRAST:  58m MULTIHANCE GADOBENATE DIMEGLUMINE 529 MG/ML IV SOLN COMPARISON:  Comparison made with previous MRI from 07/27/2017. FINDINGS: Brain: Postoperative changes from interval right frontal craniotomy for tumor resection seen. Small postoperative extra-axial subdural collection overlies the right frontal convexity measuring up to 3 mm in maximal thickness. Previously seen right frontal tumor has been resected. Layering susceptibility artifact within the resection cavity compatible with postoperative blood products no discernible residual nodular or masslike enhancement to suggest residual tumor identified about the resection cavity. Overlying postoperative dural thickening and enhancement noted at the right frontal convexity as well as along the adjacent anterior falx. No evidence for significant infarct or other complication about the resection cavity. Vasogenic edema within the adjacent right frontal lobe persists but is improved relative to preoperative exam. Persistent mass effect on the right lateral ventricle with improved right-to-left shift now measuring 4 mm, previously 9 mm. Basilar cisterns remain patent. No hydrocephalus or ventricular trapping. Otherwise, remainder the brain is stable in appearance. No evidence for acute infarct. No other mass lesion  or abnormal enhancement. Vascular: Major intracranial vascular flow voids are maintained. Skull and upper cervical spine: Craniocervical junction normal. Upper cervical spine normal. Postoperative changes from prior right frontal craniotomy. JP drain remains in place at the right frontal scalp. Sinuses/Orbits: Globes orbital soft tissues within normal limits. Paranasal sinuses are clear. No mastoid effusion. Inner ear structures normal. Other: None. IMPRESSION: 1. Postoperative changes from interval right frontal craniotomy for tumor resection without complication. No residual tumor identified status post resection. 2. Persistent but improved right frontal vasogenic edema with improved 4 mm of right-to-left shift. 3. Otherwise stable appearance of the brain. Electronically Signed   By: BJeannine BogaM.D.   On: 08/05/2017 06:44   Mr BJeri CosWEZContrast  Result Date: 07/27/2017 CLINICAL DATA:  Headaches. Follow-up RIGHT frontal lobe mass. History of breast cancer, nicotine abuse, [place] fever. EXAM: MRI HEAD WITHOUT AND WITH CONTRAST TECHNIQUE: Multiplanar, multiecho pulse sequences of the brain and surrounding structures were obtained without and with intravenous contrast. CONTRAST:  176mMULTIHANCE GADOBENATE DIMEGLUMINE 529 MG/ML IV SOLN COMPARISON:  CT HEAD July 27, 2017 at 1518 hours. FINDINGS: INTRACRANIAL CONTENTS: 3.4 x 2.9 x 2.9 cm (transverse by AP by CC) solidly RIGHT frontal lobe mass at gray-white matter junction, central necrosis. Marked surrounding vasogenic edema extending through the genu of the corpus callosum. Minimal susceptibility artifact along the inferior margin. 9 mm RIGHT to LEFT midline shift with mass effect on RIGHT basal ganglia and frontal horn of lateral ventricle. Minimal LEFT occipital horn periventricular FLAIR T2 hyperintense signal clavicle for interstitial edema. No associated reduced diffusion to suggest hypercellular tumor. No satellite foci of abnormal enhancement  or signal. A few scattered subcentimeter supratentorial white matter FLAIR T2 hyperintensities most compatible with mild chronic small vessel ischemic disease. No abnormal extra-axial fluid collections or extra-axial enhancement. VASCULAR: Normal major intracranial vascular flow voids present at skull base. SKULL AND UPPER CERVICAL SPINE: No abnormal sellar expansion. No suspicious calvarial bone marrow signal. Craniocervical junction maintained. SINUSES/ORBITS: The mastoid air-cells and included paranasal sinuses are  well-aerated.The included ocular globes and orbital contents are non-suspicious. OTHER: None. IMPRESSION: 1. 3.4 x 2.9 x 2.9 cm RIGHT frontal lobe mass with imaging characteristics of solitary metastasis. Extensive vasogenic edema resulting in 9 mm RIGHT to LEFT midline shift. Equivocal very early LEFT ventricle entrapment. Electronically Signed   By: Elon Alas M.D.   On: 07/27/2017 21:22   Ct Abdomen Pelvis W Contrast  Result Date: 07/28/2017 CLINICAL DATA:  Restaging breast cancer. Brain mass seen on MRI examination yesterday. EXAM: CT CHEST, ABDOMEN, AND PELVIS WITH CONTRAST TECHNIQUE: Multidetector CT imaging of the chest, abdomen and pelvis was performed following the standard protocol during bolus administration of intravenous contrast. CONTRAST:  11m ISOVUE-300 IOPAMIDOL (ISOVUE-300) INJECTION 61% COMPARISON:  CT chest 07/19/2016 and CT abdomen/pelvis 07/18/2017 FINDINGS: CT CHEST FINDINGS Cardiovascular: The heart is normal in size and stable. No pericardial effusion. The aorta and branch vessels are patent. No dissection. Mediastinum/Nodes: No mediastinal or hilar mass or lymphadenopathy. The esophagus is grossly normal. Lungs/Pleura: Surgical changes from a prior left upper lobe lobectomy. There also radiation changes involving the anterior aspect of the left lung. No worrisome pulmonary nodules to suggest pulmonary metastatic disease. Stable emphysematous changes and areas of  bronchiectasis. Left upper lobe bronchiectasis with fluid-filled bronchi are noted. No pleural effusion. No pleural lesions. Musculoskeletal: Surgical changes involving the left breast and left axilla. No findings suspicious for recurrent tumor. No supraclavicular or axillary lymphadenopathy. No worrisome lytic or sclerotic bone lesions to suggest metastatic disease. CT ABDOMEN PELVIS FINDINGS Hepatobiliary: No hepatic lesions to suggest metastasis. The gallbladder demonstrates layering high attenuation material, likely sludge, stones or both. No common bile duct dilatation. Pancreas: No mass, inflammation or ductal dilatation. Spleen: Normal size.  No focal lesions. Adrenals/Urinary Tract: The adrenal glands and kidneys are unremarkable. No findings for metastatic disease. No hydronephrosis. The bladder is normal. Stomach/Bowel: The stomach, duodenum, small bowel and colon are grossly normal without oral contrast. No inflammatory changes, mass lesions or obstructive findings. The appendix is normal. Vascular/Lymphatic: The aorta and branch vessels are patent. The major venous structures are patent. Circumaortic left renal vein noted. No mesenteric or retroperitoneal mass or adenopathy. Reproductive: The uterus and ovaries are unremarkable. The uterus is retroverted. Other: No pelvic mass or adenopathy. No free pelvic fluid collections. No inguinal mass or adenopathy. No abdominal wall hernia or subcutaneous lesions. Musculoskeletal: No worrisome bone lesions to suggest metastatic disease. IMPRESSION: 1. No findings to suggest metastatic breast cancer involving the chest, abdomen, pelvis or osseous structures. 2. Stable emphysematous changes and areas of bronchiectasis. 3. Stable surgical changes from a left upper lobe lobectomy. 4. Surgical changes involving the left breast and left axilla but no findings for local recurrence or regional adenopathy. Electronically Signed   By: PMarijo SanesM.D.   On: 07/28/2017  11:39   Dg Chest Port 1 View  Result Date: 08/04/2017 CLINICAL DATA:  Central line placement EXAM: PORTABLE CHEST 1 VIEW COMPARISON:  06/16/2017 FINDINGS: 1719 hours. Lungs are hyperexpanded. No focal airspace consolidation or pulmonary edema opacity in the medial left apex vascular related as seen on recent CT scan. Right IJ central line tip overlies the distal SVC. No evidence for pneumothorax IMPRESSION: Right IJ central line tip overlies the distal SVC.  No pneumothorax. Electronically Signed   By: EMisty StanleyM.D.   On: 08/04/2017 17:49    EKG  EKG Interpretation None       Radiology No results found.  Procedures Procedures (including critical care time)  Medications Ordered in ED Medications  sodium chloride 0.9 % bolus 1,000 mL (not administered)     Initial Impression / Assessment and Plan / ED Course  I have reviewed the triage vital signs and the nursing notes.  Pertinent labs & imaging results that were available during my care of the patient were reviewed by me and considered in my medical decision making (see chart for details).  Iv ns bolus. Labs sent.  Reviewed nursing notes and prior charts for additional history.  From recent inpatient stay, pts bps often in range 90-100.   Po fluids.   Recheck - labs remain pending. Signed out to Dr Leonides Schanz to check labs when resulted, and reassess post ivf/labs.     Final Clinical Impressions(s) / ED Diagnoses   Final diagnoses:  None    ED Discharge Orders    None       Lajean Saver, MD 08/07/17 2321

## 2017-08-08 ENCOUNTER — Other Ambulatory Visit: Payer: Self-pay

## 2017-08-08 LAB — COMPREHENSIVE METABOLIC PANEL
ALK PHOS: 35 U/L — AB (ref 38–126)
ALT: 18 U/L (ref 14–54)
ANION GAP: 10 (ref 5–15)
AST: 20 U/L (ref 15–41)
Albumin: 2.8 g/dL — ABNORMAL LOW (ref 3.5–5.0)
BUN: 14 mg/dL (ref 6–20)
CALCIUM: 8.5 mg/dL — AB (ref 8.9–10.3)
CHLORIDE: 97 mmol/L — AB (ref 101–111)
CO2: 28 mmol/L (ref 22–32)
Creatinine, Ser: 0.98 mg/dL (ref 0.44–1.00)
GFR calc non Af Amer: >60 mL/min (ref >60)
Glucose, Bld: 87 mg/dL (ref 65–99)
Potassium: 3.7 mmol/L (ref 3.5–5.1)
SODIUM: 135 mmol/L (ref 135–145)
Total Bilirubin: 0.7 mg/dL (ref 0.3–1.2)
Total Protein: 5.3 g/dL — ABNORMAL LOW (ref 6.5–8.1)

## 2017-08-08 LAB — URINALYSIS, ROUTINE W REFLEX MICROSCOPIC
Bilirubin Urine: NEGATIVE
Glucose, UA: NEGATIVE mg/dL
Hgb urine dipstick: NEGATIVE
Ketones, ur: NEGATIVE mg/dL
LEUKOCYTES UA: NEGATIVE
NITRITE: NEGATIVE
PH: 8 (ref 5.0–8.0)
Protein, ur: NEGATIVE mg/dL
SPECIFIC GRAVITY, URINE: 1.008 (ref 1.005–1.030)

## 2017-08-08 LAB — CBC
HEMATOCRIT: 33.8 % — AB (ref 36.0–46.0)
Hemoglobin: 11.6 g/dL — ABNORMAL LOW (ref 12.0–15.0)
MCH: 32.6 pg (ref 26.0–34.0)
MCHC: 34.3 g/dL (ref 30.0–36.0)
MCV: 94.9 fL (ref 78.0–100.0)
Platelets: 158 10*3/uL (ref 150–400)
RBC: 3.56 MIL/uL — ABNORMAL LOW (ref 3.87–5.11)
RDW: 14 % (ref 11.5–15.5)
WBC: 17.2 10*3/uL — ABNORMAL HIGH (ref 4.0–10.5)

## 2017-08-08 LAB — LIPASE, BLOOD: Lipase: 30 U/L (ref 11–51)

## 2017-08-08 LAB — I-STAT BETA HCG BLOOD, ED (MC, WL, AP ONLY): HCG, QUANTITATIVE: 6.6 m[IU]/mL — AB (ref <5)

## 2017-08-08 LAB — CBG MONITORING, ED: Glucose-Capillary: 82 mg/dL (ref 65–99)

## 2017-08-08 LAB — I-STAT TROPONIN, ED: TROPONIN I, POC: 0 ng/mL (ref 0.00–0.08)

## 2017-08-08 MED ORDER — SODIUM CHLORIDE 0.9 % IV BOLUS (SEPSIS)
1000.0000 mL | Freq: Once | INTRAVENOUS | Status: AC
  Administered 2017-08-08: 1000 mL via INTRAVENOUS

## 2017-08-08 NOTE — ED Notes (Signed)
Pt given apple juice  

## 2017-08-08 NOTE — ED Notes (Signed)
Pt departed in NAD.  

## 2017-08-08 NOTE — ED Notes (Signed)
Pt ambulated around pod with minimal assistance and no complaints at this time.

## 2017-08-10 ENCOUNTER — Other Ambulatory Visit: Payer: Self-pay | Admitting: Radiation Therapy

## 2017-08-10 DIAGNOSIS — C7931 Secondary malignant neoplasm of brain: Secondary | ICD-10-CM

## 2017-08-10 DIAGNOSIS — C7949 Secondary malignant neoplasm of other parts of nervous system: Principal | ICD-10-CM

## 2017-08-10 NOTE — Assessment & Plan Note (Signed)
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 XRT brain 2. Anti Her 2 therapy with Lapatinib if Her 2 is positive

## 2017-08-11 ENCOUNTER — Inpatient Hospital Stay: Payer: 59 | Attending: Hematology and Oncology | Admitting: Hematology and Oncology

## 2017-08-11 ENCOUNTER — Telehealth: Payer: Self-pay | Admitting: Hematology and Oncology

## 2017-08-11 DIAGNOSIS — Z170 Estrogen receptor positive status [ER+]: Secondary | ICD-10-CM | POA: Insufficient documentation

## 2017-08-11 DIAGNOSIS — C50212 Malignant neoplasm of upper-inner quadrant of left female breast: Secondary | ICD-10-CM | POA: Diagnosis present

## 2017-08-11 DIAGNOSIS — C7931 Secondary malignant neoplasm of brain: Secondary | ICD-10-CM | POA: Diagnosis present

## 2017-08-11 MED ORDER — LETROZOLE 2.5 MG PO TABS: 2.5000 mg | ORAL_TABLET | Freq: Every day | ORAL | 3 refills | Status: DC

## 2017-08-11 NOTE — Telephone Encounter (Signed)
Gave patient AVS and calendar of upcoming March appointments.  °

## 2017-08-11 NOTE — Progress Notes (Signed)
Patient Care Team: [name], MD as PCP - General (Internal Medicine) [name], MD as Referring Physician (Emergency Medicine)  DIAGNOSIS:  Encounter Diagnosis  Name Primary?  . Malignant neoplasm of upper-inner quadrant of left breast in female, estrogen receptor positive ([place])     SUMMARY OF ONCOLOGIC HISTORY:   Breast cancer of upper-inner quadrant of left female breast ([place])   06/08/2012 Initial Diagnosis    invasive ductal carcinoma that was ER positive PR positive HER-2/neu positive measuring 3.1 cm by MRI criteria. Ki-67 was 70% HER-2 was amplified with a ratio 2.91      07/12/2012 - 07/17/2013 Neo-Adjuvant Chemotherapy    TCH 6 followed by Herceptin maintenance      12/11/2012 Surgery    Left breast lumpectomy: 1.8 cm tumor 1 positive sentinel node, axillary lymph node dissection 02/08/2013 showed 0/13 lymph nodes      03/25/2013 - 05/06/2013 Radiation Therapy    Adjuvant radiation therapy      06/05/2013 -  Anti-estrogen oral therapy    Tamoxifen 20 mg daily      07/27/2017 Relapse/Recurrence    MRI Brain: 3.4 x 2.9 x 2.9 cm RIGHT frontal lobe mass with imaging characteristics of solitary metastasis. Extensive vasogenic edema resulting in 9 mm RIGHT to LEFT midline shift. Equivocal very early LEFT ventricle entrapment.       08/04/2017 Surgery    Rt frontal brain resection: Poorly differentiated tumor IHC suggests breast primary ER and PR Positive       CHIEF COMPLIANT: Follow-up to discuss her treatment plan after recent hospitalization for brain metastases that were resected  INTERVAL HISTORY: Melissa [name] is a 52 year old with above-mentioned history of breast cancer who developed right frontal brain metastases that was resected on 08/04/2017.  She is here today accompanied by her family to discuss her treatment plan.  She is recovering very well from the surgery.  She is anxious to find out what treatment options that we have.  She is  scheduled to see radiation oncology for stereotactic radiosurgery.  REVIEW OF SYSTEMS:   Constitutional: Denies fevers, chills or abnormal weight loss Eyes: Denies blurriness of vision Ears, nose, mouth, throat, and face: Denies mucositis or sore throat Respiratory: Denies cough, dyspnea or wheezes Cardiovascular: Denies palpitation, chest discomfort Gastrointestinal:  Denies nausea, heartburn or change in bowel habits Skin: Denies abnormal skin rashes Lymphatics: Denies new lymphadenopathy or easy bruising Neurological recent right frontal lobe tumor resection Behavioral/Psych: Mood is stable, no new changes  Extremities: No lower extremity edema Breast:  denies any pain or lumps or nodules in either breasts All other systems were reviewed with the patient and are negative.  I have reviewed the past medical history, past surgical history, social history and family history with the patient and they are unchanged from previous note.  ALLERGIES:  is allergic to aspirin; protonix [pantoprazole]; and iodinated diagnostic agents.  MEDICATIONS:  Current Outpatient Medications  Medication Sig Dispense Refill  . albuterol (VENTOLIN HFA) 108 (90 BASE) MCG/ACT inhaler Inhale 2 puffs into the lungs every 6 (six) hours as needed. (Patient taking differently: Inhale 2 puffs into the lungs every 6 (six) hours as needed for wheezing or shortness of breath. ) 1 Inhaler 3  . budesonide-formoterol (SYMBICORT) 160-4.5 MCG/ACT inhaler Inhale 2 puffs into the lungs daily.    Marland Kitchen dexamethasone (DECADRON) 4 MG tablet Take 1 tablet (4 mg total) by mouth 2 (two) times daily. 120 tablet 3  . famotidine (PEPCID) 20 MG tablet One  at bedtime (Patient taking differently: Take 20 mg by mouth 2 (two) times daily as needed for heartburn or indigestion. ) 30 tablet 2  . HYDROcodone-acetaminophen (NORCO/VICODIN) 5-325 MG tablet Take 1-2 tablets by mouth every 4 (four) hours as needed for severe pain (headache). 60 tablet 0    . letrozole (FEMARA) 2.5 MG tablet Take 1 tablet (2.5 mg total) by mouth daily. 90 tablet 3  . mometasone-formoterol (DULERA) 100-5 MCG/ACT AERO Inhale 1 puff into the lungs daily as needed for shortness of breath.    . naproxen sodium (ALEVE) 220 MG tablet Take 220 mg by mouth 2 (two) times daily as needed (headache).     . nicotine (NICODERM CQ - DOSED IN MG/24 HOURS) 21 mg/24hr patch Place 1 patch (21 mg total) onto the skin daily. (Patient not taking: Reported on 08/07/2017) 28 patch 3  . omeprazole (PRILOSEC OTC) 20 MG tablet Take 20 mg by mouth daily as needed (for reflux).    . ondansetron (ZOFRAN ODT) 4 MG disintegrating tablet Take 1 tablet (4 mg total) by mouth every 8 (eight) hours as needed for nausea or vomiting. 30 tablet 0  . pantoprazole (PROTONIX) 40 MG tablet Take 1 tablet (40 mg total) by mouth daily before breakfast. (Patient not taking: Reported on 08/07/2017) 30 tablet 5  . Respiratory Therapy Supplies (FLUTTER) DEVI Use as directed 1 each 0  . tamoxifen (NOLVADEX) 20 MG tablet TAKE 1 TABLET BY MOUTH DAILY (Patient taking differently: Take 20 mg by mouth once a day) 90 tablet 3   No current facility-administered medications for this visit.     PHYSICAL EXAMINATION: ECOG PERFORMANCE STATUS: 1 - Symptomatic but completely ambulatory  Vitals:   08/11/17 1030  BP: 118/76  Pulse: 80  Resp: 18  Temp: 98 F (36.7 C)  SpO2: 97%   Filed Weights   08/11/17 1030  Weight: 112 lb 14.4 oz (51.2 kg)    GENERAL:alert, no distress and comfortable SKIN: skin color, texture, turgor are normal, no rashes or significant lesions EYES: normal, Conjunctiva are pink and non-injected, sclera clear OROPHARYNX:no exudate, no erythema and lips, buccal mucosa, and tongue normal  NECK: supple, thyroid normal size, non-tender, without nodularity LYMPH:  no palpable lymphadenopathy in the cervical, axillary or inguinal LUNGS: clear to auscultation and percussion with normal breathing  effort HEART: regular rate & rhythm and no murmurs and no lower extremity edema ABDOMEN:abdomen soft, non-tender and normal bowel sounds MUSCULOSKELETAL:no cyanosis of digits and no clubbing  NEURO: alert & oriented x 3 with fluent speech, no focal motor/sensory deficits EXTREMITIES: No lower extremity edema  LABORATORY DATA:  I have reviewed the data as listed CMP Latest Ref Rng & Units 08/07/2017 08/05/2017 08/03/2017  Glucose 65 - 99 mg/dL 87 134(H) 102(H)  BUN 6 - 20 mg/dL '14 17 19  '$ Creatinine 0.44 - 1.00 mg/dL 0.98 0.77 0.89  Sodium 135 - 145 mmol/L 135 137 137  Potassium 3.5 - 5.1 mmol/L 3.7 3.9 4.3  Chloride 101 - 111 mmol/L 97(L) 104 99(L)  CO2 22 - 32 mmol/L '28 23 28  '$ Calcium 8.9 - 10.3 mg/dL 8.5(L) 8.1(L) 9.5  Total Protein 6.5 - 8.1 g/dL 5.3(L) - -  Total Bilirubin 0.3 - 1.2 mg/dL 0.7 - -  Alkaline Phos 38 - 126 U/L 35(L) - -  AST 15 - 41 U/L 20 - -  ALT 14 - 54 U/L 18 - -    Lab Results  Component Value Date   WBC 17.2 (H) 08/07/2017  HGB 11.6 (L) 08/07/2017   HCT 33.8 (L) 08/07/2017   MCV 94.9 08/07/2017   PLT 158 08/07/2017   NEUTROABS 4.0 07/27/2017    ASSESSMENT & PLAN:  Breast cancer of upper-inner quadrant of left female breast (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 XRT brain 2. Anti Her 2 therapy with Lapatinib if Her 2 is positive 3.  I discontinued tamoxifen and started her on letrozole 2.5 mg daily.  Awaiting the results of HER-2 testing. I instructed her that we would like to refer her to neuro oncology with Dr. Mickeal Skinner to consult and work with [name] on continuing  management of her metastatic tumor.  I spent 25 minutes talking to the patient of which more than half was spent in counseling and coordination of care.  Orders Placed This Encounter  Procedures  . CMP ([facility] only)    Standing Status:   Future    Standing Expiration Date:   08/11/2018  . CBC with Differential (Cancer Center Only)    Standing Status:   Future    Standing Expiration Date:   08/11/2018   The patient has a good understanding of the overall plan. she agrees with it. she will call with any problems that may develop before the next visit here.   Harriette Ohara, MD 08/11/17

## 2017-08-15 ENCOUNTER — Emergency Department (HOSPITAL_COMMUNITY)
Admission: EM | Admit: 2017-08-15 | Discharge: 2017-08-15 | Disposition: A | Discharge status: Home / Self Care | Payer: 59 | Attending: Emergency Medicine | Admitting: Emergency Medicine

## 2017-08-15 ENCOUNTER — Emergency Department (HOSPITAL_COMMUNITY): Payer: 59

## 2017-08-15 ENCOUNTER — Encounter (HOSPITAL_COMMUNITY): Payer: Self-pay

## 2017-08-15 ENCOUNTER — Telehealth: Payer: Self-pay | Admitting: Hematology and Oncology

## 2017-08-15 ENCOUNTER — Other Ambulatory Visit: Payer: Self-pay

## 2017-08-15 DIAGNOSIS — Z79899 Other long term (current) drug therapy: Secondary | ICD-10-CM | POA: Diagnosis not present

## 2017-08-15 DIAGNOSIS — Z853 Personal history of malignant neoplasm of breast: Secondary | ICD-10-CM | POA: Insufficient documentation

## 2017-08-15 DIAGNOSIS — B3781 Candidal esophagitis: Secondary | ICD-10-CM | POA: Diagnosis not present

## 2017-08-15 DIAGNOSIS — J449 Chronic obstructive pulmonary disease, unspecified: Secondary | ICD-10-CM | POA: Diagnosis not present

## 2017-08-15 DIAGNOSIS — Z85841 Personal history of malignant neoplasm of brain: Secondary | ICD-10-CM | POA: Insufficient documentation

## 2017-08-15 DIAGNOSIS — R0789 Other chest pain: Secondary | ICD-10-CM | POA: Diagnosis present

## 2017-08-15 DIAGNOSIS — F17210 Nicotine dependence, cigarettes, uncomplicated: Secondary | ICD-10-CM | POA: Insufficient documentation

## 2017-08-15 LAB — CBC WITH DIFFERENTIAL/PLATELET
Basophils Absolute: 0 10*3/uL (ref 0.0–0.1)
Basophils Relative: 0 %
Eosinophils Absolute: 0.1 10*3/uL (ref 0.0–0.7)
Eosinophils Relative: 1 %
HCT: 34.5 % — ABNORMAL LOW (ref 36.0–46.0)
Hemoglobin: 11.8 g/dL — ABNORMAL LOW (ref 12.0–15.0)
Lymphocytes Relative: 8 %
Lymphs Abs: 1 10*3/uL (ref 0.7–4.0)
MCH: 32.5 pg (ref 26.0–34.0)
MCHC: 34.2 g/dL (ref 30.0–36.0)
MCV: 95 fL (ref 78.0–100.0)
Monocytes Absolute: 0.8 10*3/uL (ref 0.1–1.0)
Monocytes Relative: 6 %
Neutro Abs: 11.4 10*3/uL — ABNORMAL HIGH (ref 1.7–7.7)
Neutrophils Relative %: 85 %
Platelets: 212 10*3/uL (ref 150–400)
RBC: 3.63 MIL/uL — ABNORMAL LOW (ref 3.87–5.11)
RDW: 14.5 % (ref 11.5–15.5)
WBC: 13.3 10*3/uL — ABNORMAL HIGH (ref 4.0–10.5)

## 2017-08-15 LAB — BASIC METABOLIC PANEL
Anion gap: 12 (ref 5–15)
BUN: 13 mg/dL (ref 6–20)
CO2: 23 mmol/L (ref 22–32)
Calcium: 8.5 mg/dL — ABNORMAL LOW (ref 8.9–10.3)
Chloride: 98 mmol/L — ABNORMAL LOW (ref 101–111)
Creatinine, Ser: 0.71 mg/dL (ref 0.44–1.00)
GFR calc Af Amer: >60 mL/min (ref >60)
GFR calc non Af Amer: >60 mL/min (ref >60)
Glucose, Bld: 103 mg/dL — ABNORMAL HIGH (ref 65–99)
Potassium: 3.6 mmol/L (ref 3.5–5.1)
Sodium: 133 mmol/L — ABNORMAL LOW (ref 135–145)

## 2017-08-15 LAB — TROPONIN I: Troponin I: <0.03 ng/mL (ref <0.03)

## 2017-08-15 MED ORDER — NYSTATIN 100000 UNIT/ML MT SUSP
5.0000 mL | Freq: Once | OROMUCOSAL | Status: AC
  Administered 2017-08-15: 500000 [IU] via OROMUCOSAL
  Filled 2017-08-15: qty 5

## 2017-08-15 MED ORDER — LAPATINIB DITOSYLATE 250 MG PO TABS: 1250.0000 mg | ORAL_TABLET | Freq: Every day | ORAL | 3 refills | Status: DC

## 2017-08-15 MED ORDER — FLUCONAZOLE 100 MG PO TABS: 100.0000 mg | ORAL_TABLET | Freq: Every day | ORAL | 0 refills | Status: AC

## 2017-08-15 MED ORDER — LACTATED RINGERS IV BOLUS (SEPSIS)
1000.0000 mL | Freq: Once | INTRAVENOUS | Status: AC
  Administered 2017-08-15: 1000 mL via INTRAVENOUS

## 2017-08-15 MED ORDER — NYSTATIN 100000 UNIT/ML MT SUSP: 500000.0000 [IU] | Freq: Four times a day (QID) | OROMUCOSAL | 0 refills | Status: AC

## 2017-08-15 MED ORDER — FLUCONAZOLE 100 MG PO TABS
200.0000 mg | ORAL_TABLET | Freq: Once | ORAL | Status: AC
  Administered 2017-08-15: 200 mg via ORAL
  Filled 2017-08-15: qty 2

## 2017-08-15 NOTE — ED Notes (Signed)
Patient given discharge instruction, verbalized understand. IV removed, band aid applied. Patient ambulatory out of the department.  

## 2017-08-15 NOTE — ED Notes (Signed)
Placed pt in gown, and on monitor, warm blanket given

## 2017-08-15 NOTE — ED Notes (Signed)
Pt says had a seizure 2 days post op.

## 2017-08-15 NOTE — Telephone Encounter (Signed)
I provided the patient the result of the HER-2 testing on the brain metastases.  It was positive. I sent a prescription for lapatinib 1250 mg daily which turns out to be 5 tablets daily. I sent the prescription to [facility] who will assist her in obtaining this medication.  I informed our pharmacist Emeline Darling to counsel her about side effects. When I met her, I did spend a lot of time discussing the risks and benefits of lapatinib.

## 2017-08-15 NOTE — ED Notes (Signed)
Patient ambulatory to restroom with steady gait, clean catch instructions given and advised pt to bring specimen back to room as well.  

## 2017-08-15 NOTE — ED Provider Notes (Signed)
[facility] EMERGENCY DEPARTMENT Provider Note   CSN: [id number] Arrival date & time: 08/15/17  0919     History   Chief Complaint Chief Complaint  Patient presents with  . heart burn  . s/p brain tumor removal    HPI Melissa [name] is a 52 y.o. female.  HPI   40yF with CP and sore throat. She has had for a few days, but progressively worsening. Significantly worse when eating/drinking. She is hungry but hasn't been eating much due to the pain. No fever or chills. No dyspnea. Has been on steroids and questions where they may be causing symptoms.   Past Medical History:  Diagnosis Date  . Anemia   . Arthritis    knees and hips  . Asthma   . Bronchiolitis   . Cancer [facility])    breast cancer 2014  . Complication of anesthesia    bp dropped + desat   . COPD (chronic obstructive pulmonary disease) ([place])   . GERD (gastroesophageal reflux disease)   . H/O coccidioidomycosis    was reason for lung lobectomy  . Headache(784.0)    due to eye strain or not eating  . History of anemia    no current problem  . History of asthma    as a child  . History of breast cancer 2014   left  . History of chemotherapy    finished 07/17/2013  . History of hiatal hernia   . Hx of radiation therapy 03/25/13-05/06/13   left breast 5000 cGy/25 sessions, left breast boost 1000 cGy/5 sessions  . Runny nose 07/30/2013   clear drainage  . Wears dentures    upper    Patient Active Problem List   Diagnosis Date Noted  . Metastasis to brain ([place]) 08/04/2017  . Solitary brain metastasis ([place]) 07/27/2017  . Nicotine abuse 07/27/2017  . Migraines 07/27/2017  . HCAP (healthcare-associated pneumonia) 11/26/2016  . Upper airway cough syndrome 08/10/2016  . Abnormal echocardiogram 08/07/2013  . Chest tightness or pressure 08/07/2013  . Hx of radiation therapy   . Edema of left lower extremity 11/19/2012  . Tachycardia 09/20/2012  . Breast cancer of upper-inner quadrant of left female breast ([place])  06/08/2012  . Obstructive bronchiectasis ([place]) 01/26/2011  . Cigarette smoker 01/26/2011    Past Surgical History:  Procedure Laterality Date  . APPLICATION OF CRANIAL NAVIGATION N/A 08/04/2017   Procedure: APPLICATION OF CRANIAL NAVIGATION;  Surgeon: [name], [name], MD;  Location: [facility];  Service: Neurosurgery;  Laterality: N/A;  . AXILLARY LYMPH NODE DISSECTION Left 02/08/2013   Procedure: LEFT AXILLARY DISSECTION;  Surgeon: Edward Jolly, MD;  Location: [facility];  Service: General;  Laterality: Left;  . BREAST CYST EXCISION Right 2006  . BREAST LUMPECTOMY WITH NEEDLE LOCALIZATION AND AXILLARY SENTINEL LYMPH NODE BX Left 12/31/2012   Procedure: NEEDLE LOCALIZATION LEFT BREAST LUMPECTOMY AND LEFT AXILLARY SENTENIAL LYMPH NODE BX;  Surgeon: Edward Jolly, MD;  Location: [facility];  Service: General;  Laterality: Left;  . CESAREAN SECTION  1995/1996  . CRANIOTOMY Right 08/04/2017   Procedure: Right Frontal craniotomy for resection of tumor with stereotactic navigation;  Surgeon: [name], [name], MD;  Location: [facility];  Service: Neurosurgery;  Laterality: Right;  Right Frontal craniotomy for resection of tumor with stereotactic navigation  . LUNG LOBECTOMY Left 05/1996   upper lobe - due to [facility] Fever  . PORT-A-CATH REMOVAL Right 08/02/2013   Procedure: REMOVAL PORT-A-CATH;  Surgeon: Marland Kitchen T  Hoxworth, MD;  Location: [place];  Service: General;  Laterality: Right;  . PORTACATH PLACEMENT  07/02/2012   Procedure: INSERTION PORT-A-CATH;  Surgeon: Edward Jolly, MD;  Location: [place];  Service: General;  Laterality: N/A;  right    OB History    No data available       Home Medications    Prior to Admission medications   Medication Sig Start Date End Date Taking? Authorizing Provider  albuterol (VENTOLIN HFA) 108 (90 BASE) MCG/ACT inhaler Inhale 2 puffs into the lungs every 6 (six) hours as  needed. Patient taking differently: Inhale 2 puffs into the lungs every 6 (six) hours as needed for wheezing or shortness of breath.  12/20/12   Gardenia Phlegm, NP  budesonide-formoterol (SYMBICORT) 160-4.5 MCG/ACT inhaler Inhale 2 puffs into the lungs daily.    [provider]  dexamethasone (DECADRON) 4 MG tablet Take 1 tablet (4 mg total) by mouth 2 (two) times daily. 08/05/17   [name], [name], MD  famotidine (PEPCID) 20 MG tablet One at bedtime Patient taking differently: Take 20 mg by mouth 2 (two) times daily as needed for heartburn or indigestion.  08/09/16   Tanda Rockers, MD  HYDROcodone-acetaminophen (NORCO/VICODIN) 5-325 MG tablet Take 1-2 tablets by mouth every 4 (four) hours as needed for severe pain (headache). 08/05/17   [name], [name], MD  letrozole [facility]) 2.5 MG tablet Take 1 tablet (2.5 mg total) by mouth daily. 08/11/17   Nicholas Lose, MD  mometasone-formoterol (DULERA) 100-5 MCG/ACT AERO Inhale 1 puff into the lungs daily as needed for shortness of breath.    [provider]  naproxen sodium (ALEVE) 220 MG tablet Take 220 mg by mouth 2 (two) times daily as needed (headache).     [provider]  nicotine (NICODERM CQ - DOSED IN MG/24 HOURS) 21 mg/24hr patch Place 1 patch (21 mg total) onto the skin daily. Patient not taking: Reported on 08/07/2017 07/29/17   Rai, Vernelle Emerald, MD  omeprazole (PRILOSEC OTC) 20 MG tablet Take 20 mg by mouth daily as needed (for reflux).    [provider]  ondansetron (ZOFRAN ODT) 4 MG disintegrating tablet Take 1 tablet (4 mg total) by mouth every 8 (eight) hours as needed for nausea or vomiting. 07/29/17   Rai, Vernelle Emerald, MD  pantoprazole (PROTONIX) 40 MG tablet Take 1 tablet (40 mg total) by mouth daily before breakfast. Patient not taking: Reported on 08/07/2017 07/29/17   Mendel Corning, MD  Respiratory Therapy Supplies (FLUTTER) DEVI Use as directed 08/09/16   Tanda Rockers, MD  tamoxifen  (NOLVADEX) 20 MG tablet TAKE 1 TABLET BY MOUTH DAILY Patient taking differently: Take 20 mg by mouth once a day 11/08/16   Nicholas Lose, MD    Family History Family History  Problem Relation Age of Onset  . Emphysema Mother        was a smoker  . Heart disease Mother   . Melanoma Mother        dx in her 74s  . Asthma Brother   . Breast cancer Cousin        mother's maternal cousin; dx in her 70s    Social History Social History   Tobacco Use  . Smoking status: Current Every Day Smoker    Packs/day: 0.50    Years: 24.00    Pack years: 12.00    Types: Cigarettes  . Smokeless tobacco: Never Used  Substance Use  Topics  . Alcohol use: No  . Drug use: No     Allergies   Aspirin; Protonix [pantoprazole]; and Iodinated diagnostic agents   Review of Systems Review of Systems  All systems reviewed and negative, other than as noted in HPI.  Physical Exam Updated Vital Signs BP 110/66   Temp 98.7 F (37.1 C) (Oral)   Resp (!) 23   Ht 5\' 2"  (1.575 m)   Wt 50.8 kg (112 lb)   LMP 07/25/2012   SpO2 98%   BMI 20.49 kg/m   Physical Exam  Constitutional: She appears well-developed. No distress.  Laying in bed. Appears tired but not toxic.   HENT:  Head: Normocephalic and atraumatic.  Thrush soft palate and posterior pharynx  Eyes: Conjunctivae are normal. Right eye exhibits no discharge. Left eye exhibits no discharge.  Neck: Neck supple.  Cardiovascular: Normal rate, regular rhythm and normal heart sounds. Exam reveals no gallop and no friction rub.  No murmur heard. Pulmonary/Chest: Effort normal and breath sounds normal. No respiratory distress.  Abdominal: Soft. She exhibits no distension. There is no tenderness.  Musculoskeletal: She exhibits no edema or tenderness.  Neurological: She is alert.  Skin: Skin is warm and dry.  Psychiatric: She has a normal mood and affect. Her behavior is normal. Thought content normal.  Nursing note and vitals reviewed.    ED  Treatments / Results  Labs (all labs ordered are listed, but only abnormal results are displayed) Labs Reviewed  CBC WITH DIFFERENTIAL/PLATELET - Abnormal; Notable for the following components:      Result Value   WBC 13.3 (*)    RBC 3.63 (*)    Hemoglobin 11.8 (*)    HCT 34.5 (*)    Neutro Abs 11.4 (*)    All other components within normal limits  BASIC METABOLIC PANEL - Abnormal; Notable for the following components:   Sodium 133 (*)    Chloride 98 (*)    Glucose, Bld 103 (*)    Calcium 8.5 (*)    All other components within normal limits  TROPONIN I    EKG  EKG Interpretation  Date/Time:  Tuesday August 15 2017 09:33:54 EST Ventricular Rate:  100 PR Interval:  124 QRS Duration: 82 QT Interval:  318 QTC Calculation: 410 R Axis:   72 Text Interpretation:  Normal sinus rhythm Right atrial enlargement Borderline ECG No significant change since last tracing aside from increase in rate Confirmed by Virgel Manifold [phone]) on 08/15/2017 9:51:15 AM       Radiology Dg Chest 2 View  Result Date: 08/15/2017 CLINICAL DATA:  Chest pain EXAM: CHEST  2 VIEW COMPARISON:  08/07/2017 FINDINGS: Cardiac shadow is within normal limits. Aortic calcifications are again seen. Postsurgical changes in the left lobe are noted as well as in the left axilla. The right lung is well aerated and clear. Mild volume loss is noted on the left. No acute bony abnormality is seen. IMPRESSION: Chronic changes without acute abnormality. Electronically Signed   By: Inez Catalina M.D.   On: 08/15/2017 10:19    Procedures Procedures (including critical care time)  Medications Ordered in ED Medications  lactated ringers bolus 1,000 mL (1,000 mLs Intravenous New Bag/Given 08/15/17 1031)  fluconazole (DIFLUCAN) tablet 200 mg (200 mg Oral Given 08/15/17 1032)  nystatin (MYCOSTATIN) 100000 UNIT/ML suspension 500,000 Units (500,000 Units Mouth/Throat Given 08/15/17 1033)     Initial Impression / Assessment and Plan /  ED Course  I have reviewed the triage  vital signs and the nursing notes.  Pertinent labs & imaging results that were available during my care of the patient were reviewed by me and considered in my medical decision making (see chart for details).  Clinical Course as of Aug 15 1118  Tue Aug 15, 2017  1024 DG Chest 2 View [LC]  [phone] ED EKG [LC]    Clinical Course User Index [LC] Kalman Shan, [place]    52 year old female with chest and throat pain.  She has been on steroids for several weeks and she questions heartburn.  There may be some component of this, but suspect her symptoms are primarily from esophageal candidiasis.  Very atypical symptoms for ACS given the constant duration for several days and other characteristics.  EKG similar to previous.  Chest x-ray without acute abnormality.  Will place on nystatin swish and swallow on as well as Diflucan.  Final Clinical Impressions(s) / ED Diagnoses   Final diagnoses:  Esophageal candidiasis [facility])    ED Discharge Orders    None       Virgel Manifold, MD 08/16/17 1310

## 2017-08-15 NOTE — ED Triage Notes (Signed)
Pt reports had brain tumor removed last week at [facility].  Pt says she was up on steroids back in dec.  C/O severe heart burn since Saturday and generalized weakness.  Pt presently on chemo pill.

## 2017-08-16 ENCOUNTER — Telehealth: Payer: Self-pay | Admitting: Pharmacy Technician

## 2017-08-16 ENCOUNTER — Telehealth: Payer: Self-pay | Admitting: Pharmacist

## 2017-08-16 DIAGNOSIS — C50212 Malignant neoplasm of upper-inner quadrant of left female breast: Secondary | ICD-10-CM

## 2017-08-16 DIAGNOSIS — Z170 Estrogen receptor positive status [ER+]: Principal | ICD-10-CM

## 2017-08-16 NOTE — Telephone Encounter (Signed)
Oral Oncology Pharmacist Encounter  Received new prescription for Tykerb (lapatinib) for the treatment of Her-2 positive breat cancer with metastasis to the brain, s/p resection of brain lesion on 08/04/17, in conjunction with Femara, planned duration until disease progression or unacceptable toxicity.  Labs from Epic assessed, [place] for treatment. Last ECHO 08/07/13 LVEF 50-55% at the completion of 52yrof adjuvant Herceptin (last dose 07/17/13)  Current medication list in Epic reviewed, DDI with Tykerb and dexamethasone identified due to weak induction of CYP3A4 by dexamethasone leading to possible increased metabolism of Tykerb and decreased systemic exposure to the Tykerb.  No change to current therapy is indicated at this time.  Dexamethasone will be tapered per MD based on toleration. Efficacy and toleration will be assessed continuously throughout treatment.  [facility]. Prior authorization has been approved. Test claim revealed copayment $0 at this time for 1 month supply.  Patient to start Tykerb after SBRT, scheduled for tomorrow 08/25/17. Will follow-up with MD about Tykerb start date.  Oral Oncology Clinic will continue to follow for initial counseling and start date.  JJohny Drilling PharmD, BCPS, BCOP 08/16/2017 3:54 PM Oral Oncology Clinic [phone]

## 2017-08-16 NOTE — Telephone Encounter (Signed)
Oral Oncology Patient Advocate Encounter  Received notification from [place] that prior authorization for Tykerb is required.  PA submitted on CoverMyMeds Key [id number] Status is pending  Oral Oncology Clinic will continue to follow.  Fabio Asa. Melynda Keller, [place] Patient [place] [phone] 08/16/2017 9:21 AM

## 2017-08-16 NOTE — Telephone Encounter (Signed)
Oral Oncology Patient Advocate Encounter  Prior Authorization for Tykerb has been approved.    PA# [id number] Effective dates: 08/16/2017 through 02/12/2018  Oral Oncology Clinic will continue to follow.   Fabio Asa. Melynda Keller, [place] Patient [place] [phone] 08/16/2017 9:53 AM

## 2017-08-23 ENCOUNTER — Ambulatory Visit
Admission: RE | Admit: 2017-08-23 | Discharge: 2017-08-23 | Disposition: A | Discharge status: Home / Self Care | Payer: 59 | Source: Ambulatory Visit | Attending: Radiation Oncology | Admitting: Radiation Oncology

## 2017-08-23 DIAGNOSIS — C7931 Secondary malignant neoplasm of brain: Secondary | ICD-10-CM

## 2017-08-23 DIAGNOSIS — C7949 Secondary malignant neoplasm of other parts of nervous system: Principal | ICD-10-CM

## 2017-08-23 DIAGNOSIS — Z510 Encounter for antineoplastic radiation therapy: Secondary | ICD-10-CM | POA: Diagnosis not present

## 2017-08-23 DIAGNOSIS — Z853 Personal history of malignant neoplasm of breast: Secondary | ICD-10-CM | POA: Diagnosis not present

## 2017-08-23 MED ORDER — GADOBENATE DIMEGLUMINE 529 MG/ML IV SOLN
9.0000 mL | Freq: Once | INTRAVENOUS | Status: AC | PRN
  Administered 2017-08-23: 9 mL via INTRAVENOUS

## 2017-08-24 ENCOUNTER — Telehealth: Payer: Self-pay

## 2017-08-24 ENCOUNTER — Other Ambulatory Visit: Payer: Self-pay

## 2017-08-24 MED ORDER — FLUCONAZOLE 200 MG PO TABS: 200.0000 mg | ORAL_TABLET | Freq: Every day | ORAL | 0 refills | Status: DC

## 2017-08-24 MED ORDER — LAPATINIB DITOSYLATE 250 MG PO TABS: 1250.0000 mg | ORAL_TABLET | Freq: Every day | ORAL | 3 refills | Status: DC

## 2017-08-24 MED FILL — TYKERB 250 MG TABLET: 250 | 30 days supply | Qty: 150 | Fill #0

## 2017-08-24 NOTE — Telephone Encounter (Signed)
Oral Oncology Pharmacist Encounter   I spoke with patient for overview of: Tykerb.   Pt is doing well. Counseled patient on administration, dosing, side effects, monitoring, drug-food interactions, safe handling, storage, and disposal.  Patient will take Tykerb 250mg  tablets, 5 tablets (1250mg ) by mouth once daily on an empty stomach, 1 hour before or 2 hours after a meal.  Patient knows to avoid grapefruit and grapefruit juice. Tykerb start date: TBD, after SBRT scheduled for 08/25/17  Side effects include but not limited to: nausea, vomiting, diarrhea, prolonged QTc, rash, fatigue, and hand-foot syndrome.  Patient has already obtained loperamide and will start it at the 1st sign of diarrhea. She will alert the office for 4 episodes of diarrhea / day above baseline.   Reviewed with patient importance of keeping a medication schedule and plan for any missed doses.  Mrs. Beem voiced understanding and appreciation.   All questions answered. Medication reconciliation performed and medication/allergy list updated.  Tykerb will ship from the [facility] on Monday 08/28/17 for delivery to patient's home on 08/30/17.  I will follow-up with patient about start date.   Patient noted she is still experiencing oral candidiasis and has called Dr. Hewitt Shorts office for more fluconazole. She states she is having lots of difficulty eating, drinking, and swallowing in general due to thrush.  Patient knows to call the office with questions or concerns.  Oral Oncology Clinic will continue to follow.  Johny Drilling, PharmD, BCPS, BCOP 08/24/2017   9:25 AM Oral Oncology Clinic [phone]

## 2017-08-24 NOTE — Progress Notes (Signed)
  Radiation Oncology         [phone] ________________________________  Name: Melissa [name] MRN: [id number]  Date: 08/23/2017  DOB: 21-Sep-1965  SIMULATION AND TREATMENT PLANNING NOTE    ICD-10-CM   1. 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  Identity was confirmed.  All relevant records and images related to the planned course of therapy were reviewed.  The patient freely provided informed written consent to proceed with treatment after reviewing the details related to the planned course of therapy. The consent form was witnessed and verified by the simulation staff. Intravenous access was established for contrast administration. Then, the patient was set-up in a stable reproducible supine position for radiation therapy.  A relocatable thermoplastic stereotactic head frame was fabricated for precise immobilization.  CT images were obtained.  Surface markings were placed.  The CT images were loaded into the planning software and fused with the patient's targeting MRI scan.  Then the target and avoidance structures were contoured.  Treatment planning then occurred.  The radiation prescription was entered and confirmed.  I have requested 3D planning  I have requested a DVH of the following structures: Brain stem, brain, left eye, right eye, lenses, optic chiasm, target volumes, uninvolved brain, and normal tissue.    SPECIAL TREATMENT PROCEDURE:  The planned course of therapy using radiation constitutes a special treatment procedure. Special care is required in the management of this patient for the following reasons. This treatment constitutes a Special Treatment Procedure for the following reason: High dose per fraction requiring special monitoring for increased toxicities of treatment including daily imaging.  The special nature of the planned course  of radiotherapy will require increased physician supervision and oversight to ensure patient's safety with optimal treatment outcomes.  PLAN:  The patient will receive 25 Gy in 5 fractions.  ________________________________  Sheral Apley Tammi Klippel, M.D.

## 2017-08-24 NOTE — Telephone Encounter (Signed)
Returned pt cal regarding issues with thrush, refilled fluconazol for 7 days.  Cyndia Bent RN

## 2017-08-25 ENCOUNTER — Ambulatory Visit
Admission: RE | Admit: 2017-08-25 | Discharge: 2017-08-25 | Disposition: A | Discharge status: Home / Self Care | Payer: 59 | Source: Ambulatory Visit | Attending: Radiation Oncology | Admitting: Radiation Oncology

## 2017-08-25 VITALS — BP 84/68 | HR 96 | Temp 98.1°F | Resp 16

## 2017-08-25 DIAGNOSIS — Z510 Encounter for antineoplastic radiation therapy: Secondary | ICD-10-CM | POA: Diagnosis not present

## 2017-08-25 DIAGNOSIS — C7931 Secondary malignant neoplasm of brain: Secondary | ICD-10-CM

## 2017-08-25 NOTE — Progress Notes (Signed)
Received patient in the clinic following initial srs treatment. Patient accompanied by her family. Patient alert and oriented x 3. No distress noted. BP low. Patient states, "my bp is always low." Patient denies feeling lightheaded or dizzy. Denies headache, nausea, diplopia or tinnitus. Patient confirms she has tapered off decadron. Ritta Slot has resolved with Diflucan. Patient understands to avoid strenuous activity for the next 24 hours. In addition, patient understands to call [phone] for needs and ask for the radiation oncologist on call.   BP (!) 84/68 (BP Location: Right Arm, Patient Position: Sitting, Cuff Size: Normal)   Pulse 96   Temp 98.1 F (36.7 C) (Oral)   Resp 16   LMP 07/25/2012   SpO2 100%  Wt Readings from Last 3 Encounters:  08/15/17 112 lb (50.8 kg)  08/11/17 112 lb 14.4 oz (51.2 kg)  08/07/17 120 lb (54.4 kg)

## 2017-08-25 NOTE — Op Note (Signed)
  Name: Melissa [name]  MRN: [id number]  Date: 08/25/2017   DOB: 09/03/1965  Stereotactic Radiosurgery Operative Note  PRE-OPERATIVE DIAGNOSIS:  Solitary Brain Metastasis  POST-OPERATIVE DIAGNOSIS:  Solitary Brain Metastasis  PROCEDURE:  Stereotactic Radiosurgery  SURGEON:  [name] Chauntelle Azpeitia, MD  NARRATIVE: The patient underwent a radiation treatment planning session in the radiation oncology simulation suite under the care of the radiation oncology physician and physicist.  I participated closely in the radiation treatment planning afterwards. The patient underwent planning CT which was fused to 3T high resolution MRI with 1 mm axial slices.  These images were fused on the planning system.  We contoured the gross target volumes and subsequently expanded this to yield the Planning Target Volume. I actively participated in the planning process.  I helped to define and review the target contours and also the contours of the optic pathway, eyes, brainstem and selected nearby organs at risk.  All the dose constraints for critical structures were reviewed and compared to AAPM Task Group 101.  The prescription dose conformity was reviewed.  I approved the plan electronically.    Accordingly, Melissa [name] was brought to the TrueBeam stereotactic radiation treatment linac and placed in the custom immobilization mask.  The patient was aligned according to the IR fiducial markers with BrainLab Exactrac, then orthogonal x-rays were used in ExacTrac with the 6DOF robotic table and the shifts were made to align the patient  Melissa [name] received stereotactic radiosurgery uneventfully.    The detailed description of the procedure is recorded in the radiation oncology procedure note.  I was present for the duration of the procedure.  DISPOSITION:  Following delivery, the patient was transported to nursing in stable condition and monitored for possible acute effects to be discharged to home in stable condition  with follow-up in one month.  [name] Bricia Taher, MD 08/25/2017 11:05 AM

## 2017-08-27 NOTE — Progress Notes (Signed)
  Radiation Oncology         [phone] ________________________________  Stereotactic Treatment Procedure Note  Name: Melissa [name] MRN: [id number]  Date: 08/25/2017  DOB: 1965-11-04  SPECIAL TREATMENT PROCEDURE    ICD-10-CM   1. 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  It showed 3-dimensional radiation distributions overlaid onto the planning CT/MRI image set.  The [facility] for the target structures as well as the organs at risk were reviewed. The documentation of the 3D plan and dosimetry are filed in the radiation oncology EMR.  NARRATIVE:  Melissa [name] was brought to the TrueBeam stereotactic radiation treatment machine and placed supine on the CT couch. The head frame was applied, and the patient was set up for stereotactic radiosurgery.  Neurosurgery was present for the set-up and delivery  SIMULATION VERIFICATION:  In the couch zero-angle position, the patient underwent Exactrac imaging using the Brainlab system with orthogonal KV images.  These were carefully aligned and repeated to confirm treatment position for each of the isocenters.  The Exactrac snap film verification was repeated at each couch angle.  PROCEDURE: Melissa [name] received stereotactic radiosurgery to the following targets: Right frontal 3.8 cm post-op target was treated using 4 Rapid Arc VMAT Beams to a fraction dose of 5 Gy for the first of 5 fractions to achieve a total prescription dose of 25 Gy.  ExacTrac registration was performed for each couch angle.  The 100% isodose line was prescribed.  6 MV X-rays were delivered in the flattening filter free beam mode.  STEREOTACTIC TREATMENT MANAGEMENT:  Following delivery, the patient was transported to nursing in stable condition and monitored for possible acute effects.  Vital signs were recorded BP (!) 84/68 (BP  Location: Right Arm, Patient Position: Sitting, Cuff Size: Normal)   Pulse 96   Temp 98.1 F (36.7 C) (Oral)   Resp 16   LMP 07/25/2012   SpO2 100% . The patient tolerated treatment without significant acute effects, and was discharged to home in stable condition.    PLAN: Follow-up in one month.  ________________________________  Sheral Apley. Tammi Klippel, M.D.

## 2017-08-28 ENCOUNTER — Ambulatory Visit
Admission: RE | Admit: 2017-08-28 | Discharge: 2017-08-28 | Disposition: A | Discharge status: Home / Self Care | Payer: 59 | Source: Ambulatory Visit | Attending: Radiation Oncology | Admitting: Radiation Oncology

## 2017-08-28 DIAGNOSIS — Z510 Encounter for antineoplastic radiation therapy: Secondary | ICD-10-CM | POA: Diagnosis not present

## 2017-08-30 ENCOUNTER — Telehealth: Payer: Self-pay | Admitting: Pharmacist

## 2017-08-30 ENCOUNTER — Ambulatory Visit
Admission: RE | Admit: 2017-08-30 | Discharge: 2017-08-30 | Disposition: A | Discharge status: Home / Self Care | Payer: 59 | Source: Ambulatory Visit | Attending: Radiation Oncology | Admitting: Radiation Oncology

## 2017-08-30 DIAGNOSIS — Z510 Encounter for antineoplastic radiation therapy: Secondary | ICD-10-CM | POA: Diagnosis not present

## 2017-08-30 NOTE — Telephone Encounter (Signed)
Oral Oncology Pharmacist Encounter  Spoke with patient this morning.  She has received Tykerb. Radiation treatments going well. Ritta Slot is almost resolved.  Patient will start Tykerb 250mg  tablets, 5 tablets (1250mg ) by mouth once daily on an empty stomach, tomorrow 08/31/17  Patient knows to call the office with any questions or concerns that occur between now and next OV with Dr. Lindi Adie on 09/07/17.  Oral Oncology Clinic will continue to follow.  Johny Drilling, PharmD, BCPS, BCOP 08/30/2017 9:08 AM Oral Oncology Clinic [phone]

## 2017-09-01 ENCOUNTER — Ambulatory Visit
Admission: RE | Admit: 2017-09-01 | Discharge: 2017-09-01 | Disposition: A | Discharge status: Home / Self Care | Payer: 59 | Source: Ambulatory Visit | Attending: Radiation Oncology | Admitting: Radiation Oncology

## 2017-09-01 DIAGNOSIS — Z510 Encounter for antineoplastic radiation therapy: Secondary | ICD-10-CM | POA: Diagnosis not present

## 2017-09-04 ENCOUNTER — Encounter: Payer: Self-pay | Admitting: Radiation Oncology

## 2017-09-04 ENCOUNTER — Ambulatory Visit
Admission: RE | Admit: 2017-09-04 | Discharge: 2017-09-04 | Disposition: A | Discharge status: Home / Self Care | Payer: 59 | Source: Ambulatory Visit | Attending: Radiation Oncology | Admitting: Radiation Oncology

## 2017-09-04 DIAGNOSIS — Z510 Encounter for antineoplastic radiation therapy: Secondary | ICD-10-CM | POA: Diagnosis not present

## 2017-09-06 NOTE — Progress Notes (Signed)
  Radiation Oncology         [phone] ________________________________  Name: Melissa [name] MRN: [id number]  Date: 09/04/2017  DOB: 05-13-1966  End of Treatment Note  Diagnosis:   52 y.o. female with solitary right frontal brain metastasis post-op     Indication for treatment:  Palliative SRS     Radiation treatment dates:   08/25/2017, 08/28/2017, 08/30/2017, 09/01/2017, 09/04/2017  Site/dose:   PTV1: Right frontal 3.8 cm post-op target was treated using 4 Rapid Arc VMAT Beams to a fraction dose of 5 Gy for the first of 5 fractions to achieve a total prescription dose of 25 Gy.  Beams/energy:   SBRT/SRT-VMAT // 6X-FFF Photon  Narrative: The patient tolerated radiation treatment relatively well without significant acute effects.    Plan: The patient has completed radiation treatment. The patient will return to radiation oncology clinic for routine followup in one month. I advised her to call or return sooner if she has any questions or concerns related to her recovery or treatment. ________________________________  Sheral Apley. Tammi Klippel, M.D.  This document serves as a record of services personally performed by Tyler Pita, MD. It was created on his behalf by Rae Lips, a trained medical scribe. The creation of this record is based on the scribe's personal observations and the provider's statements to them. This document has been checked and approved by the attending provider.

## 2017-09-07 ENCOUNTER — Inpatient Hospital Stay: Payer: 59

## 2017-09-07 ENCOUNTER — Telehealth: Payer: Self-pay | Admitting: Hematology and Oncology

## 2017-09-07 ENCOUNTER — Inpatient Hospital Stay (HOSPITAL_BASED_OUTPATIENT_CLINIC_OR_DEPARTMENT_OTHER): Payer: 59 | Admitting: Hematology and Oncology

## 2017-09-07 ENCOUNTER — Other Ambulatory Visit: Payer: 59

## 2017-09-07 VITALS — BP 101/71 | HR 92 | Temp 98.3°F | Resp 20 | Ht 62.0 in | Wt 112.1 lb

## 2017-09-07 DIAGNOSIS — C50212 Malignant neoplasm of upper-inner quadrant of left female breast: Secondary | ICD-10-CM

## 2017-09-07 DIAGNOSIS — C7931 Secondary malignant neoplasm of brain: Secondary | ICD-10-CM

## 2017-09-07 DIAGNOSIS — Z170 Estrogen receptor positive status [ER+]: Secondary | ICD-10-CM

## 2017-09-07 LAB — CMP (CANCER CENTER ONLY)
ALT: 16 U/L (ref 0–55)
AST: 16 U/L (ref 5–34)
Albumin: 3.1 g/dL — ABNORMAL LOW (ref 3.5–5.0)
Alkaline Phosphatase: 68 U/L (ref 40–150)
Anion gap: 9 (ref 3–11)
BUN: 9 mg/dL (ref 7–26)
CHLORIDE: 106 mmol/L (ref 98–109)
CO2: 27 mmol/L (ref 22–29)
Calcium: 9.7 mg/dL (ref 8.4–10.4)
Creatinine: 0.8 mg/dL (ref 0.60–1.10)
GFR, Est AFR Am: >60 mL/min (ref >60)
Glucose, Bld: 92 mg/dL (ref 70–140)
Potassium: 4.6 mmol/L (ref 3.5–5.1)
SODIUM: 142 mmol/L (ref 136–145)
Total Bilirubin: <0.2 mg/dL — ABNORMAL LOW (ref 0.2–1.2)
Total Protein: 6.6 g/dL (ref 6.4–8.3)

## 2017-09-07 LAB — CBC WITH DIFFERENTIAL (CANCER CENTER ONLY)
BASOS ABS: 0.1 10*3/uL (ref 0.0–0.1)
Basophils Relative: 1 %
EOS ABS: 0.2 10*3/uL (ref 0.0–0.5)
EOS PCT: 2 %
HCT: 34.5 % — ABNORMAL LOW (ref 34.8–46.6)
Hemoglobin: 11.8 g/dL (ref 11.6–15.9)
LYMPHS PCT: 31 %
Lymphs Abs: 2.6 10*3/uL (ref 0.9–3.3)
MCH: 32.9 pg (ref 25.1–34.0)
MCHC: 34.3 g/dL (ref 31.5–36.0)
MCV: 96.1 fL (ref 79.5–101.0)
MONO ABS: 0.8 10*3/uL (ref 0.1–0.9)
Monocytes Relative: 9 %
Neutro Abs: 4.9 10*3/uL (ref 1.5–6.5)
Neutrophils Relative %: 57 %
PLATELETS: 331 10*3/uL (ref 145–400)
RBC: 3.59 MIL/uL — ABNORMAL LOW (ref 3.70–5.45)
RDW: 15.8 % — AB (ref 11.2–14.5)
WBC: 8.5 10*3/uL (ref 3.9–10.3)

## 2017-09-07 NOTE — Assessment & Plan Note (Signed)
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  SIRS brain: 08/25/2017-09/04/2017 2. Anti Her 2 therapy with Lapatinib started 08/31/2017 3.  I discontinued tamoxifen and started her on letrozole 2.5 mg daily.  Lapatinib toxicities:  Return to clinic in 1 month for follow-up

## 2017-09-07 NOTE — Telephone Encounter (Signed)
Gave patient AVs and calendar of upcoming march appointments.

## 2017-09-07 NOTE — Progress Notes (Signed)
Patient Care Team: [name], MD as PCP - General (Internal Medicine) [name], MD as Referring Physician (Emergency Medicine)  DIAGNOSIS:  Encounter Diagnoses  Name Primary?  . Solitary brain metastasis ([place]) Yes  . Malignant neoplasm of upper-inner quadrant of left breast in female, estrogen receptor positive ([place])     SUMMARY OF ONCOLOGIC HISTORY:   Breast cancer of upper-inner quadrant of left female breast ([place])   06/08/2012 Initial Diagnosis    invasive ductal carcinoma that was ER positive PR positive HER-2/neu positive measuring 3.1 cm by MRI criteria. Ki-67 was 70% HER-2 was amplified with a ratio 2.91      07/12/2012 - 07/17/2013 Neo-Adjuvant Chemotherapy    TCH 6 followed by Herceptin maintenance      12/11/2012 Surgery    Left breast lumpectomy: 1.8 cm tumor 1 positive sentinel node, axillary lymph node dissection 02/08/2013 showed 0/13 lymph nodes      03/25/2013 - 05/06/2013 Radiation Therapy    Adjuvant radiation therapy      06/05/2013 -  Anti-estrogen oral therapy    Tamoxifen 20 mg daily      07/27/2017 Relapse/Recurrence    MRI Brain: 3.4 x 2.9 x 2.9 cm RIGHT frontal lobe mass with imaging characteristics of solitary metastasis. Extensive vasogenic edema resulting in 9 mm RIGHT to LEFT midline shift. Equivocal very early LEFT ventricle entrapment.       08/04/2017 Surgery    Rt frontal brain resection: Poorly differentiated tumor IHC suggests breast primary ER and PR Positive       CHIEF COMPLIANT: Tolerating letrozole well, not started lapatinib  INTERVAL HISTORY: Melissa [name] is a 52 year old with above-mentioned history of metastatic breast cancer with a solitary brain metastases.  This was resected and the final tumor is hormone receptor positive and HER-2 positive.  She started letrozole and appears to be tolerating it very well.  She was very anxious about starting lapatinib and has not begun this medicine.  She had severe  thrush for which she took Diflucan and finally it subsided.  She was the past week she has been eating much better.  She did not want to jeopardize that by starting the lapatinib right away.  REVIEW OF SYSTEMS:   Constitutional: Denies fevers, chills or abnormal weight loss Eyes: Denies blurriness of vision Ears, nose, mouth, throat, and face: Denies mucositis or sore throat Respiratory: Denies cough, dyspnea or wheezes Cardiovascular: Denies palpitation, chest discomfort Gastrointestinal:  Denies nausea, heartburn or change in bowel habits Skin: Denies abnormal skin rashes Lymphatics: Denies new lymphadenopathy or easy bruising Neurological:Denies numbness, tingling or new weaknesses Behavioral/Psych: Mood is stable, no new changes  Extremities: No lower extremity edema  All other systems were reviewed with the patient and are negative.  I have reviewed the past medical history, past surgical history, social history and family history with the patient and they are unchanged from previous note.  ALLERGIES:  is allergic to aspirin; protonix [pantoprazole]; and iodinated diagnostic agents.  MEDICATIONS:  Current Outpatient Medications  Medication Sig Dispense Refill  . albuterol (VENTOLIN HFA) 108 (90 BASE) MCG/ACT inhaler Inhale 2 puffs into the lungs every 6 (six) hours as needed. (Patient taking differently: Inhale 2 puffs into the lungs every 6 (six) hours as needed for wheezing or shortness of breath. ) 1 Inhaler 3  . budesonide-formoterol (SYMBICORT) 160-4.5 MCG/ACT inhaler Inhale 2 puffs into the lungs daily.    Marland Kitchen dexamethasone (DECADRON) 4 MG tablet Take 1 tablet (4 mg total) by mouth  2 (two) times daily. 120 tablet 3  . esomeprazole (NEXIUM) 20 MG capsule Take 20 mg by mouth daily.    . famotidine (PEPCID) 20 MG tablet One at bedtime (Patient taking differently: Take 20 mg by mouth 2 (two) times daily as needed for heartburn or indigestion. ) 30 tablet 2  . fluconazole (DIFLUCAN)  200 MG tablet Take 1 tablet (200 mg total) by mouth daily. 7 tablet 0  . HYDROcodone-acetaminophen (NORCO/VICODIN) 5-325 MG tablet Take 1-2 tablets by mouth every 4 (four) hours as needed for severe pain (headache). 60 tablet 0  . lapatinib (TYKERB) 250 MG tablet Take 5 tablets (1,250 mg total) by mouth daily. Take on an empty stomach, 1 hr before or 2 hrs after meals. 150 tablet 3  . letrozole (FEMARA) 2.5 MG tablet Take 1 tablet (2.5 mg total) by mouth daily. 90 tablet 3  . mometasone-formoterol (DULERA) 100-5 MCG/ACT AERO Inhale 1 puff into the lungs daily as needed for shortness of breath.    . naproxen sodium (ALEVE) 220 MG tablet Take 440 mg by mouth 2 (two) times daily as needed (headache).     . ondansetron (ZOFRAN ODT) 4 MG disintegrating tablet Take 1 tablet (4 mg total) by mouth every 8 (eight) hours as needed for nausea or vomiting. 30 tablet 0  . Respiratory Therapy Supplies (FLUTTER) DEVI Use as directed 1 each 0   No current facility-administered medications for this visit.     PHYSICAL EXAMINATION: ECOG PERFORMANCE STATUS: 1 - Symptomatic but completely ambulatory  Vitals:   09/07/17 1355  BP: 101/71  Pulse: 92  Resp: 20  Temp: 98.3 F (36.8 C)  SpO2: 100%   Filed Weights   09/07/17 1355  Weight: 112 lb 1.6 oz (50.8 kg)    GENERAL:alert, no distress and comfortable SKIN: skin color, texture, turgor are normal, no rashes or significant lesions EYES: normal, Conjunctiva are pink and non-injected, sclera clear OROPHARYNX:no exudate, no erythema and lips, buccal mucosa, and tongue normal  NECK: supple, thyroid normal size, non-tender, without nodularity LYMPH:  no palpable lymphadenopathy in the cervical, axillary or inguinal LUNGS: clear to auscultation and percussion with normal breathing effort HEART: regular rate & rhythm and no murmurs and no lower extremity edema ABDOMEN:abdomen soft, non-tender and normal bowel sounds MUSCULOSKELETAL:no cyanosis of digits and  no clubbing  NEURO: alert & oriented x 3 with fluent speech, no focal motor/sensory deficits EXTREMITIES: No lower extremity edema  LABORATORY DATA:  I have reviewed the data as listed CMP Latest Ref Rng & Units 08/15/2017 08/07/2017 08/05/2017  Glucose 65 - 99 mg/dL 103(H) 87 134(H)  BUN 6 - 20 mg/dL _0 Creatinine 0.44 - 1.00 mg/dL 0.71 0.98 0.77  Sodium 135 - 145 mmol/L 133(L) 135 137  Potassium 3.5 - 5.1 mmol/L 3.6 3.7 3.9  Chloride 101 - 111 mmol/L 98(L) 97(L) 104  CO2 22 - 32 mmol/L _1 Calcium 8.9 - 10.3 mg/dL 8.5(L) 8.5(L) 8.1(L)  Total Protein 6.5 - 8.1 g/dL - 5.3(L) -  Total Bilirubin 0.3 - 1.2 mg/dL - 0.7 -  Alkaline Phos 38 - 126 U/L - 35(L) -  AST 15 - 41 U/L - 20 -  ALT 14 - 54 U/L - 18 -    Lab Results  Component Value Date   WBC 8.5 09/07/2017   HGB 11.8 (L) 08/15/2017   HCT 34.5 (L) 09/07/2017   MCV 96.1 09/07/2017   PLT 331 09/07/2017   NEUTROABS 4.9 09/07/2017  ASSESSMENT & PLAN:  Breast cancer of upper-inner quadrant of left female breast ([place]) 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  SIRS brain: 08/25/2017-09/04/2017 2. Anti Her 2 therapy with Lapatinib to be started 09/17/2017 3.  I discontinued tamoxifen and started her on letrozole 2.5 mg daily.  Lapatinib toxicities: Patient has not started this yet.  She will start 4 tablets a day instead of 5 approximately 09/17/2017 Diarrhea: I instructed her to take probiotics. Return to clinic 09/22/2017 to assess tolerability to lapatinib.     I spent 25 minutes talking to the patient of which more  than half was spent in counseling and coordination of care.  Orders Placed This Encounter  Procedures  . CBC with Differential (Cancer Center Only)    Standing Status:   Future    Standing Expiration Date:   09/07/2018  . CMP ([place] only)    Standing Status:   Future    Standing Expiration Date:   09/07/2018  . ECHOCARDIOGRAM COMPLETE    Standing Status:   Future    Standing Expiration Date:   12/06/2018    Order Specific Question:   Where should this test be performed    Answer:   [facility])    Order Specific Question:   Does the patient weigh less than or greater than 250 lbs?    Answer:   Patient weighs less than 250 lbs    Order Specific Question:   Perflutren DEFINITY (image enhancing agent) should be administered unless hypersensitivity or allergy exist    Answer:   Administer Perflutren    Order Specific Question:   Expected Date:    Answer:   1 week   The patient has a good understanding of the overall plan. she agrees with it. she will call with any problems that may develop before the next visit here.   Harriette Ohara, MD 09/07/17

## 2017-09-12 ENCOUNTER — Other Ambulatory Visit: Payer: Self-pay | Admitting: Hematology and Oncology

## 2017-09-12 DIAGNOSIS — Z9889 Other specified postprocedural states: Secondary | ICD-10-CM

## 2017-09-18 ENCOUNTER — Ambulatory Visit (HOSPITAL_COMMUNITY)
Admission: RE | Admit: 2017-09-18 | Discharge: 2017-09-18 | Disposition: A | Discharge status: Home / Self Care | Payer: 59 | Source: Ambulatory Visit | Attending: Hematology and Oncology | Admitting: Hematology and Oncology

## 2017-09-18 DIAGNOSIS — Z170 Estrogen receptor positive status [ER+]: Secondary | ICD-10-CM | POA: Diagnosis not present

## 2017-09-18 DIAGNOSIS — C50212 Malignant neoplasm of upper-inner quadrant of left female breast: Secondary | ICD-10-CM | POA: Diagnosis present

## 2017-09-18 DIAGNOSIS — C7931 Secondary malignant neoplasm of brain: Secondary | ICD-10-CM | POA: Diagnosis present

## 2017-09-18 NOTE — Progress Notes (Signed)
  Echocardiogram 2D Echocardiogram has been performed.  Merrie Roof F 09/18/2017, 10:56 AM

## 2017-09-22 ENCOUNTER — Telehealth: Payer: Self-pay | Admitting: Hematology and Oncology

## 2017-09-22 ENCOUNTER — Inpatient Hospital Stay: Payer: 59 | Attending: Hematology and Oncology

## 2017-09-22 ENCOUNTER — Inpatient Hospital Stay (HOSPITAL_BASED_OUTPATIENT_CLINIC_OR_DEPARTMENT_OTHER): Payer: 59 | Admitting: Hematology and Oncology

## 2017-09-22 ENCOUNTER — Ambulatory Visit
Admission: RE | Admit: 2017-09-22 | Discharge: 2017-09-22 | Disposition: A | Discharge status: Home / Self Care | Payer: 59 | Source: Ambulatory Visit | Attending: Hematology and Oncology | Admitting: Hematology and Oncology

## 2017-09-22 DIAGNOSIS — Z170 Estrogen receptor positive status [ER+]: Secondary | ICD-10-CM | POA: Insufficient documentation

## 2017-09-22 DIAGNOSIS — C773 Secondary and unspecified malignant neoplasm of axilla and upper limb lymph nodes: Secondary | ICD-10-CM

## 2017-09-22 DIAGNOSIS — C7931 Secondary malignant neoplasm of brain: Secondary | ICD-10-CM | POA: Insufficient documentation

## 2017-09-22 DIAGNOSIS — Z9889 Other specified postprocedural states: Secondary | ICD-10-CM

## 2017-09-22 DIAGNOSIS — C50212 Malignant neoplasm of upper-inner quadrant of left female breast: Secondary | ICD-10-CM | POA: Insufficient documentation

## 2017-09-22 LAB — CMP (CANCER CENTER ONLY)
ALK PHOS: 70 U/L (ref 40–150)
ALT: 26 U/L (ref 0–55)
AST: 25 U/L (ref 5–34)
Albumin: 3.5 g/dL (ref 3.5–5.0)
Anion gap: 8 (ref 3–11)
BUN: 11 mg/dL (ref 7–26)
CALCIUM: 9.9 mg/dL (ref 8.4–10.4)
CO2: 24 mmol/L (ref 22–29)
CREATININE: 0.83 mg/dL (ref 0.60–1.10)
Chloride: 108 mmol/L (ref 98–109)
Glucose, Bld: 67 mg/dL — ABNORMAL LOW (ref 70–140)
Potassium: 4.6 mmol/L (ref 3.5–5.1)
Sodium: 140 mmol/L (ref 136–145)
Total Bilirubin: 0.2 mg/dL (ref 0.2–1.2)
Total Protein: 7.2 g/dL (ref 6.4–8.3)

## 2017-09-22 LAB — CBC WITH DIFFERENTIAL (CANCER CENTER ONLY)
BASOS PCT: 1 %
Basophils Absolute: 0.1 10*3/uL (ref 0.0–0.1)
EOS ABS: 0.5 10*3/uL (ref 0.0–0.5)
Eosinophils Relative: 7 %
HCT: 38.3 % (ref 34.8–46.6)
HEMOGLOBIN: 12.9 g/dL (ref 11.6–15.9)
Lymphocytes Relative: 36 %
Lymphs Abs: 2.6 10*3/uL (ref 0.9–3.3)
MCH: 32.6 pg (ref 25.1–34.0)
MCHC: 33.7 g/dL (ref 31.5–36.0)
MCV: 96.9 fL (ref 79.5–101.0)
Monocytes Absolute: 0.7 10*3/uL (ref 0.1–0.9)
Monocytes Relative: 10 %
NEUTROS PCT: 46 %
Neutro Abs: 3.3 10*3/uL (ref 1.5–6.5)
Platelet Count: 262 10*3/uL (ref 145–400)
RBC: 3.96 MIL/uL (ref 3.70–5.45)
RDW: 15.4 % — ABNORMAL HIGH (ref 11.2–14.5)
WBC: 7.1 10*3/uL (ref 3.9–10.3)

## 2017-09-22 NOTE — Progress Notes (Signed)
Patient Care Team: [name], MD as PCP - General (Internal Medicine) [name], MD as Referring Physician (Emergency Medicine)  DIAGNOSIS:  Encounter Diagnosis  Name Primary?  . Malignant neoplasm of upper-inner quadrant of left breast in female, estrogen receptor positive ([place])     SUMMARY OF ONCOLOGIC HISTORY:   Breast cancer of upper-inner quadrant of left female breast ([place])   06/08/2012 Initial Diagnosis    invasive ductal carcinoma that was ER positive PR positive HER-2/neu positive measuring 3.1 cm by MRI criteria. Ki-67 was 70% HER-2 was amplified with a ratio 2.91      07/12/2012 - 07/17/2013 Neo-Adjuvant Chemotherapy    TCH 6 followed by Herceptin maintenance      12/11/2012 Surgery    Left breast lumpectomy: 1.8 cm tumor 1 positive sentinel node, axillary lymph node dissection 02/08/2013 showed 0/13 lymph nodes      03/25/2013 - 05/06/2013 Radiation Therapy    Adjuvant radiation therapy      06/05/2013 -  Anti-estrogen oral therapy    Tamoxifen 20 mg daily      07/27/2017 Relapse/Recurrence    MRI Brain: 3.4 x 2.9 x 2.9 cm RIGHT frontal lobe mass with imaging characteristics of solitary metastasis. Extensive vasogenic edema resulting in 9 mm RIGHT to LEFT midline shift. Equivocal very early LEFT ventricle entrapment.       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  She was started on letrozole therapy and she appears to be tolerating it well.  She got the medication for lapatinib and will start today.  REVIEW OF SYSTEMS:   Constitutional: Denies fevers, chills or abnormal weight  loss Eyes: Denies blurriness of vision Ears, nose, mouth, throat, and face: Denies mucositis or sore throat Respiratory: Denies cough, dyspnea or wheezes Cardiovascular: Denies palpitation, chest discomfort Gastrointestinal:  Denies nausea, heartburn or change in bowel habits Skin: Denies abnormal skin rashes Lymphatics: Denies new lymphadenopathy or easy bruising Neurological:Denies numbness, tingling or new weaknesses Behavioral/Psych: Mood is stable, no new changes  Extremities: No lower extremity edema All other systems were reviewed with the patient and are negative.  I have reviewed the past medical history, past surgical history, social history and family history with the patient and they are unchanged from previous note.  ALLERGIES:  is allergic to aspirin; protonix [pantoprazole]; and iodinated diagnostic agents.  MEDICATIONS:  Current Outpatient Medications  Medication Sig Dispense Refill  . albuterol (VENTOLIN HFA) 108 (90 BASE) MCG/ACT inhaler Inhale 2 puffs into the lungs every 6 (six) hours as needed. (Patient taking differently: Inhale 2 puffs into the lungs every 6 (six) hours as needed for wheezing or shortness of breath. ) 1 Inhaler 3  . budesonide-formoterol (SYMBICORT) 160-4.5 MCG/ACT inhaler Inhale 2 puffs into the lungs daily.    Marland Kitchen dexamethasone (DECADRON) 4 MG tablet Take 1 tablet (4 mg total) by mouth 2 (two) times daily. 120 tablet 3  . esomeprazole (NEXIUM) 20 MG capsule Take 20 mg by mouth daily.    . famotidine (PEPCID) 20 MG tablet One at bedtime (Patient taking differently: Take 20 mg by mouth 2 (two) times daily as needed for heartburn or indigestion. ) 30 tablet 2  .  fluconazole (DIFLUCAN) 200 MG tablet Take 1 tablet (200 mg total) by mouth daily. 7 tablet 0  . HYDROcodone-acetaminophen (NORCO/VICODIN) 5-325 MG tablet Take 1-2 tablets by mouth every 4 (four) hours as needed for severe pain (headache). 60 tablet 0  . lapatinib (TYKERB) 250 MG tablet Take 5  tablets (1,250 mg total) by mouth daily. Take on an empty stomach, 1 hr before or 2 hrs after meals. 150 tablet 3  . letrozole (FEMARA) 2.5 MG tablet Take 1 tablet (2.5 mg total) by mouth daily. 90 tablet 3  . mometasone-formoterol (DULERA) 100-5 MCG/ACT AERO Inhale 1 puff into the lungs daily as needed for shortness of breath.    . naproxen sodium (ALEVE) 220 MG tablet Take 440 mg by mouth 2 (two) times daily as needed (headache).     . ondansetron (ZOFRAN ODT) 4 MG disintegrating tablet Take 1 tablet (4 mg total) by mouth every 8 (eight) hours as needed for nausea or vomiting. 30 tablet 0  . Respiratory Therapy Supplies (FLUTTER) DEVI Use as directed 1 each 0   No current facility-administered medications for this visit.     PHYSICAL EXAMINATION: ECOG PERFORMANCE STATUS: 1 - Symptomatic but completely ambulatory  Vitals:   09/22/17 0859  BP: 106/74  Pulse: 77  Resp: 18  Temp: 98.4 F (36.9 C)  SpO2: 100%   Filed Weights   09/22/17 0859  Weight: 114 lb 4.8 oz (51.8 kg)    GENERAL:alert, no distress and comfortable SKIN: skin color, texture, turgor are normal, no rashes or significant lesions EYES: normal, Conjunctiva are pink and non-injected, sclera clear OROPHARYNX:no exudate, no erythema and lips, buccal mucosa, and tongue normal  NECK: supple, thyroid normal size, non-tender, without nodularity LYMPH:  no palpable lymphadenopathy in the cervical, axillary or inguinal LUNGS: clear to auscultation and percussion with normal breathing effort HEART: regular rate & rhythm and no murmurs and no lower extremity edema ABDOMEN:abdomen soft, non-tender and normal bowel sounds MUSCULOSKELETAL:no cyanosis of digits and no clubbing  NEURO: alert & oriented x 3 with fluent speech, no focal motor/sensory deficits EXTREMITIES: No lower extremity edema  LABORATORY DATA:  I have reviewed the data as listed CMP Latest Ref Rng & Units 09/07/2017 08/15/2017 08/07/2017  Glucose 70 - 140 mg/dL  92 103(H) 87  BUN 7 - 26 mg/dL _0 Creatinine 0.60 - 1.10 mg/dL 0.80 0.71 0.98  Sodium 136 - 145 mmol/L 142 133(L) 135  Potassium 3.5 - 5.1 mmol/L 4.6 3.6 3.7  Chloride 98 - 109 mmol/L 106 98(L) 97(L)  CO2 22 - 29 mmol/L _1 Calcium 8.4 - 10.4 mg/dL 9.7 8.5(L) 8.5(L)  Total Protein 6.4 - 8.3 g/dL 6.6 - 5.3(L)  Total Bilirubin 0.2 - 1.2 mg/dL <0.2(L) - 0.7  Alkaline Phos 40 - 150 U/L 68 - 35(L)  AST 5 - 34 U/L 16 - 20  ALT 0 - 55 U/L 16 - 18    Lab Results  Component Value Date   WBC 7.1 09/22/2017   HGB 11.8 (L) 08/15/2017   HCT 38.3 09/22/2017   MCV 96.9 09/22/2017   PLT 262 09/22/2017   NEUTROABS 3.3 09/22/2017    ASSESSMENT & PLAN:  Breast cancer of upper-inner quadrant of left female breast (HCC) 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  SIRS brain: 08/25/2017-09/04/2017 2. Anti Her 2 therapy with Lapatinib started 09/17/2017 3.I discontinuedtamoxifen and started her on letrozole 2.5 mg daily.  Lapatinib toxicities:  Currently on 1000 mg lapatinib   Return to clinic in 2 weeks for follow-up   I spent 25 minutes talking to the patient of which more than half was spent in counseling and coordination of care.  No orders of the defined types were placed in this encounter.  The patient has a good understanding of the overall plan. she agrees with it. she will call with any problems that may develop before the next visit here.   Harriette Ohara, MD 09/22/17

## 2017-09-22 NOTE — Telephone Encounter (Signed)
Gave avs and calendar ° °

## 2017-09-22 NOTE — Assessment & Plan Note (Signed)
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  SIRS brain: 08/25/2017-09/04/2017 2. Anti Her 2 therapy with Lapatinib started 09/17/2017 3.I discontinuedtamoxifen and started her on letrozole 2.5 mg daily.  Lapatinib toxicities:  Currently on 1000 mg lapatinib   Return to clinic in 1 month for follow-up

## 2017-10-04 ENCOUNTER — Encounter: Payer: Self-pay | Admitting: Urology

## 2017-10-04 ENCOUNTER — Ambulatory Visit
Admission: RE | Admit: 2017-10-04 | Discharge: 2017-10-04 | Disposition: A | Discharge status: Home / Self Care | Payer: 59 | Source: Ambulatory Visit | Attending: Radiation Oncology | Admitting: Radiation Oncology

## 2017-10-04 ENCOUNTER — Other Ambulatory Visit: Payer: Self-pay

## 2017-10-04 VITALS — BP 105/67 | HR 93 | Temp 98.6°F | Resp 20 | Ht 62.0 in | Wt 116.2 lb

## 2017-10-04 DIAGNOSIS — Z79811 Long term (current) use of aromatase inhibitors: Secondary | ICD-10-CM | POA: Diagnosis not present

## 2017-10-04 DIAGNOSIS — Z170 Estrogen receptor positive status [ER+]: Secondary | ICD-10-CM | POA: Diagnosis not present

## 2017-10-04 DIAGNOSIS — Z91041 Radiographic dye allergy status: Secondary | ICD-10-CM | POA: Insufficient documentation

## 2017-10-04 DIAGNOSIS — Z79899 Other long term (current) drug therapy: Secondary | ICD-10-CM | POA: Insufficient documentation

## 2017-10-04 DIAGNOSIS — Z886 Allergy status to analgesic agent status: Secondary | ICD-10-CM | POA: Insufficient documentation

## 2017-10-04 DIAGNOSIS — Z923 Personal history of irradiation: Secondary | ICD-10-CM | POA: Insufficient documentation

## 2017-10-04 DIAGNOSIS — Z853 Personal history of malignant neoplasm of breast: Secondary | ICD-10-CM | POA: Insufficient documentation

## 2017-10-04 DIAGNOSIS — Z7951 Long term (current) use of inhaled steroids: Secondary | ICD-10-CM | POA: Insufficient documentation

## 2017-10-04 DIAGNOSIS — C7931 Secondary malignant neoplasm of brain: Secondary | ICD-10-CM | POA: Diagnosis present

## 2017-10-04 DIAGNOSIS — Z888 Allergy status to other drugs, medicaments and biological substances status: Secondary | ICD-10-CM | POA: Diagnosis not present

## 2017-10-04 NOTE — Progress Notes (Signed)
Radiation Oncology         [phone] ________________________________  Name: Melissa [name] MRN: [id number]  Date: 10/04/2017  DOB: Dec 16, 1965  Post Treatment Note  CC: [name], MD  [name], [name], *  Diagnosis:   52 y.o. female with solitary right frontal brain metastasis, ER PR positive, HER-2 positive, from invasive ductal carcinoma of the left breast, s/p gross total resection 08/05/17  Interval Since Last Radiation:  4 weeks  08/25/2017, 08/28/2017, 08/30/2017, 09/01/2017, 09/04/2017  Narrative:  The patient returns today for routine follow-up. She tolerated radiation treatment relatively well without significant acute effects.    On review of systems, the patient states that she is doing well overall.  She is without complaints aside from mild residual fatigue.  She has noticed improved energy levels over the past week and is anxious to return to work.  She denies headaches, decreased visual or auditory acuity, tinnitus, tremor, focal weakness or seizure activity.  She is not having difficulty with speech or word finding.  She denies abdominal pain, N/V or diarrhea.  She remains on Letrozole and has recently started immunotherapy with Lapatinib and is tolerating this well. She reports a healthy appetite and is steadily gaining her weight back which she is pleased with.  ALLERGIES:  is allergic to aspirin; protonix [pantoprazole]; and iodinated diagnostic agents.  Meds: Current Outpatient Medications  Medication Sig Dispense Refill  . budesonide-formoterol (SYMBICORT) 160-4.5 MCG/ACT inhaler Inhale 2 puffs into the lungs daily.    . famotidine (PEPCID) 20 MG tablet One at bedtime (Patient taking differently: Take 20 mg by mouth 2 (two) times daily as needed for heartburn or indigestion. ) 30 tablet 2  . lapatinib (TYKERB) 250 MG tablet Take 5 tablets (1,250 mg total) by mouth daily. Take on an empty stomach, 1 hr before or 2 hrs after meals. 150 tablet 3  .  letrozole (FEMARA) 2.5 MG tablet Take 1 tablet (2.5 mg total) by mouth daily. 90 tablet 3  . naproxen sodium (ALEVE) 220 MG tablet Take 440 mg by mouth 2 (two) times daily as needed (headache).     Marland Kitchen albuterol (VENTOLIN HFA) 108 (90 BASE) MCG/ACT inhaler Inhale 2 puffs into the lungs every 6 (six) hours as needed. (Patient not taking: Reported on 10/04/2017) 1 Inhaler 3  . esomeprazole (NEXIUM) 20 MG capsule Take 20 mg by mouth daily.    Marland Kitchen HYDROcodone-acetaminophen (NORCO/VICODIN) 5-325 MG tablet Take 1-2 tablets by mouth every 4 (four) hours as needed for severe pain (headache). (Patient not taking: Reported on 10/04/2017) 60 tablet 0  . mometasone-formoterol (DULERA) 100-5 MCG/ACT AERO Inhale 1 puff into the lungs daily as needed for shortness of breath.    . ondansetron (ZOFRAN ODT) 4 MG disintegrating tablet Take 1 tablet (4 mg total) by mouth every 8 (eight) hours as needed for nausea or vomiting. (Patient not taking: Reported on 10/04/2017) 30 tablet 0  . Respiratory Therapy Supplies (FLUTTER) DEVI Use as directed (Patient not taking: Reported on 10/04/2017) 1 each 0   No current facility-administered medications for this encounter.     Physical Findings:  height is _0  (1.575 m) and weight is 116 lb 3.2 oz (52.7 kg). Her oral temperature is 98.6 F (37 C). Her blood pressure is 105/67 and her pulse is 93. Her respiration is 20 and oxygen saturation is 100%.  Pain Assessment Pain Score: 0-No pain/10 In general this is a well appearing caucasian female in no acute distress. She's alert and oriented  x4 and appropriate throughout the examination. Cardiopulmonary assessment is negative for acute distress and she exhibits normal effort. She appears grossly neurologically intact with intact EOMs bilaterally and PERRLA.  Speech is fluent and appropriate. Sensation is intact to light touch in the upper and lower extremities and strenght is 5/5 and equal bilaterally in upper and lower extremities.  Her  scalp incision is well healed without signs of infection, drainage or bleeding.   Lab Findings: Lab Results  Component Value Date   WBC 7.1 09/22/2017   HGB 11.8 (L) 08/15/2017   HCT 38.3 09/22/2017   MCV 96.9 09/22/2017   PLT 262 09/22/2017     Radiographic Findings: Mm Diag Breast Tomo Bilateral  Result Date: 09/22/2017 CLINICAL DATA:  52 year old who underwent malignant lumpectomy of the upper left breast in June, 2014, after neoadjuvant chemotherapy.Adjuvant radiation therapy. Patient was diagnosed in January, 2019, with a brain metastasis from the breast primary for which the patient recently completed radiation therapy and is currently taking letrozole. Annual evaluation. EXAM: DIGITAL DIAGNOSTIC BILATERAL MAMMOGRAM WITH CAD AND TOMO COMPARISON:  05/31/2016, 02/10/2015 and earlier. ACR Breast Density Category c: The breast tissue is heterogeneously dense, which may obscure small masses. FINDINGS: Tomosynthesis and synthesized full field CC and MLO views of both breasts were obtained. A standard spot magnification MLO view of the lumpectomy site in the left breast was also obtained. Post surgical scar/architectural distortion at the lumpectomy site in the upper outer left breast at middle to posterior depth. Surgical clips in the left axilla at the site of prior node removal. No new or suspicious findings in the left breast. No findings suspicious for malignancy in the right breast. Mammographic images were processed with CAD. IMPRESSION: 1. No mammographic evidence of malignancy involving either breast. 2. Expected post lumpectomy changes involving the left breast. RECOMMENDATION: Bilateral diagnostic mammography in 1 year. I have discussed the findings and recommendations with the patient. Results were also provided in writing at the conclusion of the visit. If applicable, a reminder letter will be sent to the patient regarding the next appointment. BI-RADS CATEGORY  2: Benign. Electronically  Signed   By: Evangeline Dakin M.D.   On: 09/22/2017 14:24    Impression/Plan: 1. 52 y.o. female with solitary right frontal brain metastasis, ER PR positive, HER-2 positive, from invasive ductal carcinoma of the left breast, s/p gross total resection 08/05/17. She appears to be recovering well from the effects of radiation  and is currently without complaints.  We will plan to obtain a follow up MRI brain in 2 months to assess treatment response.  Her case will be presented at the multidisciplinary brain conference and we will arrange for a follow-up office visit shortly thereafter to review results and recommendations. If disease is stable at that time, we will plan to proceed with serial MRI brain scans q 3 months to monitor for disease progression or recurrance.  She is in agreement with and is comfortable with this plan.  She knows to call with any questions or concerns in the interim.      Nicholos Johns, PA-C

## 2017-10-05 ENCOUNTER — Other Ambulatory Visit: Payer: Self-pay

## 2017-10-05 DIAGNOSIS — C50212 Malignant neoplasm of upper-inner quadrant of left female breast: Secondary | ICD-10-CM

## 2017-10-06 ENCOUNTER — Inpatient Hospital Stay: Payer: 59

## 2017-10-06 DIAGNOSIS — C50212 Malignant neoplasm of upper-inner quadrant of left female breast: Secondary | ICD-10-CM | POA: Diagnosis not present

## 2017-10-06 LAB — CMP (CANCER CENTER ONLY)
ALBUMIN: 3.4 g/dL — AB (ref 3.5–5.0)
ALK PHOS: 76 U/L (ref 40–150)
ALT: 38 U/L (ref 0–55)
AST: 28 U/L (ref 5–34)
Anion gap: 7 (ref 3–11)
BUN: 9 mg/dL (ref 7–26)
CO2: 27 mmol/L (ref 22–29)
Calcium: 9.7 mg/dL (ref 8.4–10.4)
Chloride: 106 mmol/L (ref 98–109)
Creatinine: 0.78 mg/dL (ref 0.60–1.10)
GFR, Est AFR Am: >60 mL/min (ref >60)
Glucose, Bld: 80 mg/dL (ref 70–140)
POTASSIUM: 4.1 mmol/L (ref 3.5–5.1)
Sodium: 140 mmol/L (ref 136–145)
Total Protein: 6.9 g/dL (ref 6.4–8.3)

## 2017-10-06 LAB — CBC WITH DIFFERENTIAL (CANCER CENTER ONLY)
BASOS PCT: 1 %
Basophils Absolute: 0.1 10*3/uL (ref 0.0–0.1)
Eosinophils Absolute: 0.4 10*3/uL (ref 0.0–0.5)
Eosinophils Relative: 4 %
HEMATOCRIT: 37.3 % (ref 34.8–46.6)
HEMOGLOBIN: 12.7 g/dL (ref 11.6–15.9)
LYMPHS ABS: 2 10*3/uL (ref 0.9–3.3)
LYMPHS PCT: 19 %
MCH: 32.9 pg (ref 25.1–34.0)
MCHC: 34.1 g/dL (ref 31.5–36.0)
MCV: 96.6 fL (ref 79.5–101.0)
MONO ABS: 0.8 10*3/uL (ref 0.1–0.9)
MONOS PCT: 7 %
NEUTROS ABS: 7.3 10*3/uL — AB (ref 1.5–6.5)
NEUTROS PCT: 69 %
Platelet Count: 290 10*3/uL (ref 145–400)
RBC: 3.86 MIL/uL (ref 3.70–5.45)
RDW: 14.8 % — AB (ref 11.2–14.5)
WBC Count: 10.5 10*3/uL — ABNORMAL HIGH (ref 3.9–10.3)

## 2017-10-17 ENCOUNTER — Other Ambulatory Visit: Payer: Self-pay

## 2017-10-17 DIAGNOSIS — C50212 Malignant neoplasm of upper-inner quadrant of left female breast: Secondary | ICD-10-CM

## 2017-10-17 NOTE — Assessment & Plan Note (Signed)
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 Anti Her 2 therapy with Lapatinibstarted 09/17/2017 3.I discontinuedtamoxifen and started her on letrozole 2.5 mg daily.  Lapatinib toxicities: Currently on 1000 mg lapatinib   Return to clinic in 4 weeks for follow-up

## 2017-10-18 ENCOUNTER — Telehealth: Payer: Self-pay | Admitting: Hematology and Oncology

## 2017-10-18 ENCOUNTER — Inpatient Hospital Stay: Payer: 59 | Attending: Hematology and Oncology | Admitting: Hematology and Oncology

## 2017-10-18 ENCOUNTER — Inpatient Hospital Stay: Payer: 59

## 2017-10-18 DIAGNOSIS — C7931 Secondary malignant neoplasm of brain: Secondary | ICD-10-CM | POA: Diagnosis present

## 2017-10-18 DIAGNOSIS — C773 Secondary and unspecified malignant neoplasm of axilla and upper limb lymph nodes: Secondary | ICD-10-CM

## 2017-10-18 DIAGNOSIS — Z170 Estrogen receptor positive status [ER+]: Secondary | ICD-10-CM | POA: Diagnosis not present

## 2017-10-18 DIAGNOSIS — C50212 Malignant neoplasm of upper-inner quadrant of left female breast: Secondary | ICD-10-CM | POA: Diagnosis not present

## 2017-10-18 DIAGNOSIS — R194 Change in bowel habit: Secondary | ICD-10-CM | POA: Diagnosis not present

## 2017-10-18 LAB — CMP (CANCER CENTER ONLY)
ALK PHOS: 72 U/L (ref 40–150)
ALT: 15 U/L (ref 0–55)
ANION GAP: 7 (ref 3–11)
AST: 15 U/L (ref 5–34)
Albumin: 3.3 g/dL — ABNORMAL LOW (ref 3.5–5.0)
BUN: 9 mg/dL (ref 7–26)
CALCIUM: 9.7 mg/dL (ref 8.4–10.4)
CO2: 27 mmol/L (ref 22–29)
Chloride: 105 mmol/L (ref 98–109)
Creatinine: 0.86 mg/dL (ref 0.60–1.10)
GLUCOSE: 110 mg/dL (ref 70–140)
POTASSIUM: 3.7 mmol/L (ref 3.5–5.1)
Sodium: 139 mmol/L (ref 136–145)
TOTAL PROTEIN: 7.1 g/dL (ref 6.4–8.3)

## 2017-10-18 LAB — CBC WITH DIFFERENTIAL (CANCER CENTER ONLY)
BASOS ABS: 0.3 10*3/uL — AB (ref 0.0–0.1)
BASOS PCT: 3 %
Eosinophils Absolute: 0.4 10*3/uL (ref 0.0–0.5)
Eosinophils Relative: 5 %
HEMATOCRIT: 34.5 % — AB (ref 34.8–46.6)
Hemoglobin: 11.8 g/dL (ref 11.6–15.9)
LYMPHS PCT: 33 %
Lymphs Abs: 2.7 10*3/uL (ref 0.9–3.3)
MCH: 32.6 pg (ref 25.1–34.0)
MCHC: 34.1 g/dL (ref 31.5–36.0)
MCV: 95.4 fL (ref 79.5–101.0)
MONO ABS: 0.5 10*3/uL (ref 0.1–0.9)
Monocytes Relative: 6 %
NEUTROS ABS: 4.4 10*3/uL (ref 1.5–6.5)
NEUTROS PCT: 53 %
Platelet Count: 386 10*3/uL (ref 145–400)
RBC: 3.62 MIL/uL — AB (ref 3.70–5.45)
RDW: 14.4 % (ref 11.2–14.5)
WBC: 8.3 10*3/uL (ref 3.9–10.3)

## 2017-10-18 MED ORDER — LAPATINIB DITOSYLATE 250 MG PO TABS: 1000.0000 mg | ORAL_TABLET | Freq: Every day | ORAL | 3 refills | Status: DC

## 2017-10-18 NOTE — Telephone Encounter (Signed)
Gave avs and calendar ° °

## 2017-10-18 NOTE — Progress Notes (Signed)
Patient Care Team: [name], MD as PCP - General (Internal Medicine) [name], MD as Referring Physician (Emergency Medicine)  DIAGNOSIS:  Encounter Diagnosis  Name Primary?  . Malignant neoplasm of upper-inner quadrant of left breast in female, estrogen receptor positive ([place])     SUMMARY OF ONCOLOGIC HISTORY:   Breast cancer of upper-inner quadrant of left female breast ([place])   06/08/2012 Initial Diagnosis    invasive ductal carcinoma that was ER positive PR positive HER-2/neu positive measuring 3.1 cm by MRI criteria. Ki-67 was 70% HER-2 was amplified with a ratio 2.91      07/12/2012 - 07/17/2013 Neo-Adjuvant Chemotherapy    TCH 6 followed by Herceptin maintenance      12/11/2012 Surgery    Left breast lumpectomy: 1.8 cm tumor 1 positive sentinel node, axillary lymph node dissection 02/08/2013 showed 0/13 lymph nodes      03/25/2013 - 05/06/2013 Radiation Therapy    Adjuvant radiation therapy      06/05/2013 - 07/20/2017 Anti-estrogen oral therapy    Tamoxifen 20 mg daily      07/27/2017 Relapse/Recurrence    MRI Brain: 3.4 x 2.9 x 2.9 cm RIGHT frontal lobe mass with imaging characteristics of solitary metastasis. Extensive vasogenic edema resulting in 9 mm RIGHT to LEFT midline shift. Equivocal very early LEFT ventricle entrapment.       08/04/2017 Surgery    Rt frontal brain resection: Poorly differentiated tumor IHC suggests breast primary ER and PR Positive      08/25/2017 - 09/04/2017 Radiation Therapy    Stereotactic radiation      09/18/2017 -  Anti-estrogen oral therapy    Lapatinib with letrozole       CHIEF COMPLIANT: Follow-up on lapatinib therapy  INTERVAL HISTORY: Melissa [name] is a 52 year old with above-mentioned history of left breast cancer who presented with brain metastases and has undergone resection of the tumor which confirmed that she has metastatic breast cancer that was ER PR positive and HER-2 positive.  She completed  radiation therapy.  She is currently on lapatinib with letrozole.  She has been on it for the past 1 month and appears to be tolerating it extremely well.  She has one episode of loose stool every day which gets better with Imodium.  Denies any nausea vomiting.  Denies any fatigue.  She is back to work at that speed will be helpful to her mental status.  REVIEW OF SYSTEMS:   Constitutional: Denies fevers, chills or abnormal weight loss Eyes: Denies blurriness of vision Ears, nose, mouth, throat, and face: Denies mucositis or sore throat Respiratory: Denies cough, dyspnea or wheezes Cardiovascular: Denies palpitation, chest discomfort Gastrointestinal:  Denies nausea, heartburn or change in bowel habits Skin: Denies abnormal skin rashes Lymphatics: Denies new lymphadenopathy or easy bruising Neurological:Denies numbness, tingling or new weaknesses Behavioral/Psych: Mood is stable, no new changes  Extremities: No lower extremity edema  All other systems were reviewed with the patient and are negative.  I have reviewed the past medical history, past surgical history, social history and family history with the patient and they are unchanged from previous note.  ALLERGIES:  is allergic to aspirin; protonix [pantoprazole]; and iodinated diagnostic agents.  MEDICATIONS:  Current Outpatient Medications  Medication Sig Dispense Refill  . albuterol (VENTOLIN HFA) 108 (90 BASE) MCG/ACT inhaler Inhale 2 puffs into the lungs every 6 (six) hours as needed. (Patient not taking: Reported on 10/04/2017) 1 Inhaler 3  . budesonide-formoterol (SYMBICORT) 160-4.5 MCG/ACT inhaler Inhale 2 puffs  into the lungs daily.    . lapatinib (TYKERB) 250 MG tablet Take 4 tablets (1,000 mg total) by mouth daily. Take on an empty stomach, 1 hr before or 2 hrs after meals. 150 tablet 3  . letrozole (FEMARA) 2.5 MG tablet Take 1 tablet (2.5 mg total) by mouth daily. 90 tablet 3  . mometasone-formoterol (DULERA) 100-5 MCG/ACT  AERO Inhale 1 puff into the lungs daily as needed for shortness of breath.    . naproxen sodium (ALEVE) 220 MG tablet Take 440 mg by mouth 2 (two) times daily as needed (headache).      No current facility-administered medications for this visit.     PHYSICAL EXAMINATION: ECOG PERFORMANCE STATUS: 1 - Symptomatic but completely ambulatory  Vitals:   10/18/17 1447  BP: 115/74  Pulse: 86  Resp: 18  Temp: 98.5 F (36.9 C)  SpO2: 99%   Filed Weights   10/18/17 1447  Weight: 114 lb 11.2 oz (52 kg)    GENERAL:alert, no distress and comfortable SKIN: skin color, texture, turgor are normal, no rashes or significant lesions EYES: normal, Conjunctiva are pink and non-injected, sclera clear OROPHARYNX:no exudate, no erythema and lips, buccal mucosa, and tongue normal  NECK: supple, thyroid normal size, non-tender, without nodularity LYMPH:  no palpable lymphadenopathy in the cervical, axillary or inguinal LUNGS: clear to auscultation and percussion with normal breathing effort HEART: regular rate & rhythm and no murmurs and no lower extremity edema ABDOMEN:abdomen soft, non-tender and normal bowel sounds MUSCULOSKELETAL:no cyanosis of digits and no clubbing  NEURO: alert & oriented x 3 with fluent speech, no focal motor/sensory deficits EXTREMITIES: No lower extremity edema  LABORATORY DATA:  I have reviewed the data as listed CMP Latest Ref Rng & Units 10/06/2017 09/22/2017 09/07/2017  Glucose 70 - 140 mg/dL 80 67(L) 92  BUN 7 - 26 mg/dL '9 11 9  '$ Creatinine 0.60 - 1.10 mg/dL 0.78 0.83 0.80  Sodium 136 - 145 mmol/L 140 140 142  Potassium 3.5 - 5.1 mmol/L 4.1 4.6 4.6  Chloride 98 - 109 mmol/L 106 108 106  CO2 22 - 29 mmol/L '27 24 27  '$ Calcium 8.4 - 10.4 mg/dL 9.7 9.9 9.7  Total Protein 6.4 - 8.3 g/dL 6.9 7.2 6.6  Total Bilirubin 0.2 - 1.2 mg/dL <0.2(L) 0.2 <0.2(L)  Alkaline Phos 40 - 150 U/L 76 70 68  AST 5 - 34 U/L '28 25 16  '$ ALT 0 - 55 U/L 38 26 16    Lab Results  Component  Value Date   WBC 8.3 10/18/2017   HGB 11.8 (L) 08/15/2017   HCT 34.5 (L) 10/18/2017   MCV 95.4 10/18/2017   PLT 386 10/18/2017   NEUTROABS 4.4 10/18/2017    ASSESSMENT & PLAN:  Breast cancer of upper-inner quadrant of left female breast ([facility]CC) 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 Anti Her 2 therapy with Lapatinibstarted 09/17/2017 3.I discontinuedtamoxifen and started her on letrozole 2.5 mg daily.  Lapatinib toxicities: Currently on 1000 mg lapatinib  Patient has one episode of loose stools every day. Denies any fatigue. She is back at work starting Monday and is very glad and happy about  it.  Return to clinic in 8 weeks for follow-up patient has an appointment for a brain MRI in May    Orders Placed This Encounter  Procedures  . CBC with Differential (Cancer Center Only)    Standing Status:   Future    Standing Expiration Date:   10/19/2018  . CMP ([place] only)    Standing Status:   Future    Standing Expiration Date:   10/19/2018   The patient has a good understanding of the overall plan. she agrees with it. she will call with any problems that may develop before the next visit here.   Harriette Ohara, MD 10/18/17

## 2017-10-20 MED FILL — TYKERB 250 MG TABLET: 250 | 30 days supply | Qty: 120 | Fill #0

## 2017-10-25 ENCOUNTER — Other Ambulatory Visit: Payer: Self-pay | Admitting: Radiation Therapy

## 2017-10-25 DIAGNOSIS — C7949 Secondary malignant neoplasm of other parts of nervous system: Principal | ICD-10-CM

## 2017-10-25 DIAGNOSIS — C7931 Secondary malignant neoplasm of brain: Secondary | ICD-10-CM

## 2017-11-22 ENCOUNTER — Ambulatory Visit (INDEPENDENT_AMBULATORY_CARE_PROVIDER_SITE_OTHER)
Admission: RE | Admit: 2017-11-22 | Discharge: 2017-11-22 | Disposition: A | Discharge status: Home / Self Care | Payer: 59 | Source: Ambulatory Visit | Attending: Internal Medicine | Admitting: Internal Medicine

## 2017-11-22 ENCOUNTER — Ambulatory Visit: Payer: 59 | Admitting: Internal Medicine

## 2017-11-22 VITALS — BP 102/68 | HR 77 | Ht 62.0 in | Wt 112.0 lb

## 2017-11-22 DIAGNOSIS — J479 Bronchiectasis, uncomplicated: Secondary | ICD-10-CM | POA: Diagnosis not present

## 2017-11-22 DIAGNOSIS — F17210 Nicotine dependence, cigarettes, uncomplicated: Secondary | ICD-10-CM

## 2017-11-22 DIAGNOSIS — J189 Pneumonia, unspecified organism: Secondary | ICD-10-CM

## 2017-11-22 DIAGNOSIS — J181 Lobar pneumonia, unspecified organism: Secondary | ICD-10-CM

## 2017-11-22 MED ORDER — AMOXICILLIN-POT CLAVULANATE 875-125 MG PO TABS: 1.0000 | ORAL_TABLET | Freq: Two times a day (BID) | ORAL | 0 refills | Status: AC

## 2017-11-22 MED ORDER — BUDESONIDE-FORMOTEROL FUMARATE 160-4.5 MCG/ACT IN AERO: 2.0000 | INHALATION_SPRAY | Freq: Two times a day (BID) | RESPIRATORY_TRACT | 0 refills | Status: DC

## 2017-11-22 MED ORDER — BUDESONIDE-FORMOTEROL FUMARATE 160-4.5 MCG/ACT IN AERO: 2.0000 | INHALATION_SPRAY | Freq: Every day | RESPIRATORY_TRACT | 11 refills | Status: DC

## 2017-11-22 NOTE — Patient Instructions (Addendum)
Plan A = Automatic = Symbicort or dulera  One twice daily of either one   Work on inhaler technique:  relax and gently blow all the way out then take a nice smooth deep breath back in, triggering the inhaler at same time you start breathing in.  Hold for up to 5 seconds if you can. Blow out thru nose. Rinse and gargle with water when done      Plan B = Backup Only use your albuterol (ventolin) as a rescue medication to be used if you can't catch your breath by resting or doing a relaxed purse lip breathing pattern.  - The less you use it, the better it will work when you need it. - Ok to use the inhaler up to 2 puffs  every 4 hours if you must but call for appointment if use goes up over your usual need - Don't leave home without it !!  (think of it like the spare tire for your car)    Augmentin 875 mg take one pill twice daily  X 10 days - take at breakfast and supper with large glass of water.  It would help reduce the usual side effects (diarrhea and yeast infections) if you ate cultured yogurt at lunch.  mucinex dm 1200 every 12 hours is the best over the counter    Please remember to go to the  x-ray department downstairs in the basement  for your tests - we will call you with the results when they are available.  Please schedule a follow up office visit in 6 weeks, call sooner if needed

## 2017-11-22 NOTE — Progress Notes (Signed)
Subjective:     Patient ID: Melissa [name], female   DOB: [birth year] .   MRN: [id number]    Brief patient profile:  4 yowf active smoker dx'd with [place] in [place] requiring [place] in 1997 and 3 years of antifungal rx with fluconazole with residual sob heavy doe and sev times a year pain in L shoulder and lower back up to couple times a year lasts up  to a couple  of weeks maybe better with abx, maybe some worse joint pains,  Sometimes some sweats, occ with nasty mucus but no blood referred to pulmonary clinic for recurrent cp 01/2011 with documented Bronchiectasis in 2012    History of Present Illness  01/26/2011 Initial pulmonary office eval cc recurrent pleuritic L ant chest  pain, more severe than usual episodes,  rad neck,  Onset x 2 weeks with abrupt onset,  no better on lodine> stopped.   No sweats or nasty mucus now. Some better at day of ov p rx = dex and zithromax but  some sorethroat since dex with overt hb. rec ct chest POS BRONCHIECTASIS    Nov 2013 Breast Ca > lumpectomy with pos node> RT and chemo Finished Jan 2015     07/15/2016 acute extended ov/Melissa [name] re: ? Bronchiectasis /dulera 100 2 bid maint / still smoking Chief Complaint  Patient presents with  . Acute Visit    lower right back pain x 1 wk- radiating to her right rib area over the past 2 days. She feels discomfort when she takes a deep breath.  She also c/o episodes of feeling light headed. She is not having any SOB and no more cough than usual.    acute onset one week prior to OV  Very Similar pattern/characteristics to prev flares of bronchiectasis related cp on L side but this CP is located R post rad anteriorly / worse with breathing/ worse supine/ moderately severe No sob or need for rescue saba / some better with tylenol  rec levaquin 750 mg one daily x 5 days  For cough / congestion > mucinex dm up to 1200 every 12 hours as needed  Plan A = Automatic = Symbicort 80 1-2 every 12 hours  Work on maintaining  perfect  inhaler technique:  Plan B = Backup Only use your albuterol as a rescue medication CTa  07/19/16 neg PE or explanation for R cp c/w mscp from cough     11/25/2016  f/u ov/Melissa [name] re: f/u apparent pna LLL in pt with known bronchiectasis/ cystic lesion LLL  Chief Complaint  Patient presents with  . Follow-up    Pt states dxed with PNA x 3 wks ago.  She states she was told a few days ago that her CXR showed a lung mass and told her that she needed a CT Chest. She states having right side pain and minimal SOB.    cough and cp improved from last ov/ no excess/ purulent sputum or mucus plugs then mid April 2018 abrupt onset  nasal stuffiness and more chest congestion more yellowish then green/chills >  seen in [place]  With dx "L sided pna" rx x levaquin x 10 days > green mucus gone /chills gone >  Back to baseline level of chronic mscp R > L worse  Only with cough and back to nl ex tol   rec The key is to stop smoking completely before smoking completely stops you!      02/27/2017  f/u ov/[name] re:  Bronchiectasis with nl pfts on sybm 80 2bid / still smoking  Chief Complaint  Patient presents with  . Follow-up    Breathing is doing well. No new co's today.   cps all gone and Not limited by breathing from desired activities   No need for saba  rec Stop smoking    06/16/2017  f/u ov/Melissa [name] re:  Bronchiectasis s/p valley fever/ symb 80 2bid  Chief Complaint  Patient presents with  . Follow-up    Breathing is overall doing well. She states she has been having some HA and was dxed with sinus infection a few days ago- taking levaquin.    Ha  frontal and bilateral and worse in am and better on levquin/medrol  rec  Call if not better after you finish your antibiotics The key is to stop smoking completely before smoking completely stops you!   Brain surgery Jan 2019 then RT Feb 2019 finished taking dex around late Feb    11/22/2017 acute extended ov/Melissa [name] re:  Bronchiectasis / symb 76  2bid  Chief Complaint  Patient presents with  . Acute Visit    Having trouble breathing productive cough mucus yellow in color. sharp pain in back.  around early April 2019  nasal congestion / cough slt yellow thick mucus  rx sudaphed seemed some better Then abruptly 2 d prior to [place] chills/ L post cp similar location as prior episodes of bronchiectasis and more sob with activity but not at rest though feels can't take a deep comfortable breath -   Cp better aleve and heating pad. Having to use"rescue" again sev times day and occ noct  (confused between dulera and saba though)    No obvious day to day or daytime variability or   mucus plugs or hemoptysis or  gen chest tightness, subjective wheeze or overt sinus or hb symptoms. No unusual exposure hx or h/o childhood pna/ asthma or knowledge of premature birth.    Also denies any obvious fluctuation of symptoms with weather or environmental changes or other aggravating or alleviating factors except as outlined above   Current Allergies, Complete Past Medical History, Past Surgical History, Family History, and Social History were reviewed in [employer] record.  ROS  The following are not active complaints unless bolded Hoarseness, sore throat, dysphagia, dental problems, itching, sneezing,  nasal congestion or discharge of excess mucus or purulent secretions, ear ache,   fever, chills, sweats, unintended wt loss or wt gain, classically    exertional cp,  orthopnea pnd or arm/hand swelling  or leg swelling, presyncope, palpitations, abdominal pain, anorexia, nausea, vomiting, diarrhea  or change in bowel habits or change in bladder habits, change in stools or change in urine, dysuria, hematuria,  rash, arthralgias, visual complaints, headache, numbness, weakness or ataxia or problems with walking or coordination,  change in mood or  memory.        Current Meds  Medication Sig  . albuterol (VENTOLIN HFA) 108 (90 BASE)  MCG/ACT inhaler Inhale 2 puffs into the lungs every 6 (six) hours as needed.  . budesonide-formoterol (SYMBICORT) 160-4.5 MCG/ACT inhaler Inhale 2 puffs into the lungs daily.  . lapatinib (TYKERB) 250 MG tablet Take 4 tablets (1,000 mg total) by mouth daily. Take on an empty stomach, 1 hr before or 2 hrs after meals.  Marland Kitchen letrozole (FEMARA) 2.5 MG tablet Take 1 tablet (2.5 mg total) by mouth daily.  . mometasone-formoterol (DULERA) 100-5 MCG/ACT AERO Inhale 1 puff into the  lungs daily as needed for shortness of breath.  . naproxen sodium (ALEVE) 220 MG tablet Take 440 mg by mouth 2 (two) times daily as needed (headache).   .                           Objective:   Physical Exam  amb wf rattling cough   Wt 118  02/10/11 >  05/03/2011  118 > 117 03/23/2012 >  122 07/30/2012 > 09/29/2014 121> 06/13/2016   119 > 07/15/2016 118 > 08/09/2016  118  > 11/25/2016   114 > 02/27/2017   116 > 06/16/2017  119 >  11/22/2017 112    Vital signs reviewed - Note on arrival 02 sats  96% on RA         HEENT: nl  turbinates bilaterally, and oropharynx. Nl external ear canals without cough reflex - top dentures    NECK :  without JVD/Nodes/TM/ nl carotid upstrokes bilaterally   LUNGS: no acc muscle use,  Nl contour chest with scattered insp/exp rhonchi, no rub or bronchial changes   CV:  RRR  no s3 or murmur or increase in P2, and no edema   ABD:  soft and nontender with nl inspiratory excursion in the supine position. No bruits or organomegaly appreciated, bowel sounds nl  MS:  Nl gait/ ext warm without deformities, calf tenderness, cyanosis or clubbing No obvious joint restrictions   SKIN: warm and dry without lesions    NEURO:  alert, approp, nl sensorium with  no motor or cerebellar deficits apparent.       CXR PA and Lateral:   11/22/2017 :    I personally reviewed images and  impression as follows:   LLL ssa/ ? As density in lat basal segment            Assessment:

## 2017-11-23 ENCOUNTER — Encounter: Payer: Self-pay | Admitting: Internal Medicine

## 2017-11-23 DIAGNOSIS — J189 Pneumonia, unspecified organism: Secondary | ICD-10-CM | POA: Insufficient documentation

## 2017-11-23 MED FILL — TYKERB 250 MG TABLET: 250 | 30 days supply | Qty: 120 | Fill #1

## 2017-11-23 NOTE — Assessment & Plan Note (Signed)
>   48m  Strongly advised to quit in view of impact of cigs on mucociliary function assoc with dx of bronchiectasis

## 2017-11-23 NOTE — Assessment & Plan Note (Addendum)
Cannot take levaquin due to rx with lapatinib > rx augmentin x 10days as this all started with nasal symptoms that may suggest sinusitis  F/u with cxr in 6 weeks unless directed sooner by symptoms /note smoker so need to close the loop.

## 2017-11-23 NOTE — Assessment & Plan Note (Addendum)
-   CT chest   01/26/2011 Status post left upper lobectomy.  Focal bronchiectasis / scarring medially in the left lung apex with  surrounding inflammatory changes  - Pneumovax 08/12/11  And prevnar 08/09/2016  - 07/15/2016  After extensive coaching HFA effectiveness =    75% symbicort 80 2bid  - PFT's  08/09/2016  FEV1 2.05 (77 % ) ratio 70  p 5 % improvement from saba p dulera 100 x2  prior to study with DLCO  46 % corrects to 55  % for alv volume    - The proper method of use, as well as anticipated side effects, of a metered-dose inhaler are discussed and demonstrated to the patient. Improved effectiveness after extensive coaching during this visit to a level of approximately 90% from a baseline of 75 % and spent time teaching the difference between her two laba/ics inhalers and the saba (has two p was given dulera in hosp and confused it with symbicort)      Acute flare with change on cxr c/w early pna LLL  And recurrent pleuritic cp in exact same distribution making alternative dx like pe much less likely   rec Augmentin 875 mg take one pill twice daily  X 10 days -  But no change in other meds.   I had an extended discussion with the patient reviewing all relevant studies completed to date and  lasting 15 to 20 minutes of a 25 minute acute office visit    See device teaching which extended face to face time for this visit   Each maintenance medication was reviewed in detail including most importantly the difference between maintenance and prns and under what circumstances the prns are to be triggered using an action plan format that is not reflected in the computer generated alphabetically organized AVS.    Please see AVS for specific instructions unique to this visit that I personally wrote and verbalized to the the pt in detail and then reviewed with pt  by my nurse highlighting any  changes in therapy recommended at today's visit to their plan of care.

## 2017-11-24 NOTE — Progress Notes (Signed)
Spoke with pt and notified of results per Dr. Wert. Pt verbalized understanding and denied any questions. 

## 2017-11-24 NOTE — Progress Notes (Signed)
LMTCB

## 2017-12-05 NOTE — Progress Notes (Signed)
Craig Guess. Pinard 52 y.o. female with solitary right frontal brain metastasis post-op completed radiation review 12-07-17 MRI w wo contrast FU.    Headache:Occasional headaches mostly if she goes out into the heat. Pain:No Dizziness:Yes a little the past few days if she moves to quickly. Nausea/vomiting:No Ringing in ears:No  Visual changes (Blurred/ diplopia double vision,blind spots, and peripheral vsion changes):Saw spots on Sunday had been out in the heat got better once she was cooled off in side the house with the air conditioner. Fatigue:No has a high energy level usually. Cognitive changes:Forgetfulness at times ongoing. Wt Readings from Last 3 Encounters:  12/12/17 112 lb (50.8 kg)  11/22/17 112 lb (50.8 kg)  10/18/17 114 lb 11.2 oz (52 kg)  BP 106/68 (BP Location: Left Arm, Patient Position: Sitting, Cuff Size: Normal)   Pulse 77   Temp 99.1 F (37.3 C) (Oral)   Resp 18   Ht 5\' 2"  (1.575 m)   Wt 112 lb (50.8 kg)   LMP 07/25/2012   SpO2 98%   BMI 20.49 kg/m

## 2017-12-07 ENCOUNTER — Ambulatory Visit
Admission: RE | Admit: 2017-12-07 | Discharge: 2017-12-07 | Disposition: A | Discharge status: Home / Self Care | Payer: 59 | Source: Ambulatory Visit | Attending: Radiation Oncology | Admitting: Radiation Oncology

## 2017-12-07 DIAGNOSIS — C7931 Secondary malignant neoplasm of brain: Secondary | ICD-10-CM

## 2017-12-07 DIAGNOSIS — C7949 Secondary malignant neoplasm of other parts of nervous system: Principal | ICD-10-CM

## 2017-12-07 MED ORDER — GADOBENATE DIMEGLUMINE 529 MG/ML IV SOLN
10.0000 mL | Freq: Once | INTRAVENOUS | Status: AC | PRN
  Administered 2017-12-07: 10 mL via INTRAVENOUS

## 2017-12-07 NOTE — Progress Notes (Signed)
Susan, please set-up SRS 

## 2017-12-12 ENCOUNTER — Ambulatory Visit
Admission: RE | Admit: 2017-12-12 | Discharge: 2017-12-12 | Disposition: A | Discharge status: Home / Self Care | Payer: 59 | Source: Ambulatory Visit | Attending: Urology | Admitting: Urology

## 2017-12-12 ENCOUNTER — Ambulatory Visit
Admission: RE | Admit: 2017-12-12 | Discharge: 2017-12-12 | Disposition: A | Discharge status: Home / Self Care | Payer: No Typology Code available for payment source | Source: Ambulatory Visit | Attending: Radiation Oncology | Admitting: Radiation Oncology

## 2017-12-12 ENCOUNTER — Encounter: Payer: Self-pay | Admitting: Urology

## 2017-12-12 ENCOUNTER — Other Ambulatory Visit: Payer: Self-pay

## 2017-12-12 VITALS — BP 106/68 | HR 77 | Temp 99.1°F | Resp 18 | Ht 62.0 in | Wt 112.0 lb

## 2017-12-12 DIAGNOSIS — Z79899 Other long term (current) drug therapy: Secondary | ICD-10-CM | POA: Insufficient documentation

## 2017-12-12 DIAGNOSIS — Z510 Encounter for antineoplastic radiation therapy: Secondary | ICD-10-CM | POA: Insufficient documentation

## 2017-12-12 DIAGNOSIS — C7931 Secondary malignant neoplasm of brain: Secondary | ICD-10-CM | POA: Diagnosis present

## 2017-12-12 DIAGNOSIS — C50912 Malignant neoplasm of unspecified site of left female breast: Secondary | ICD-10-CM | POA: Insufficient documentation

## 2017-12-12 DIAGNOSIS — Z79811 Long term (current) use of aromatase inhibitors: Secondary | ICD-10-CM | POA: Diagnosis not present

## 2017-12-12 DIAGNOSIS — R5383 Other fatigue: Secondary | ICD-10-CM | POA: Insufficient documentation

## 2017-12-12 DIAGNOSIS — Z170 Estrogen receptor positive status [ER+]: Secondary | ICD-10-CM | POA: Insufficient documentation

## 2017-12-12 MED ORDER — SODIUM CHLORIDE 0.9% FLUSH
10.0000 mL | INTRAVENOUS | Status: AC
  Administered 2017-12-12: 10 mL via INTRAVENOUS

## 2017-12-12 NOTE — Progress Notes (Signed)
Radiation Oncology         [phone] ________________________________  Name: Melissa [name] MRN: [id number]  Date: 12/12/2017  DOB: [birth year]  Post Treatment Note  CC: [name], MD  [name], [name], *  Diagnosis:   52 y.o. female with solitary right frontal brain metastasis, ER PR positive, HER-2 positive, from invasive ductal carcinoma of the left breast, s/p gross total resection 08/05/17  Interval Since Last Radiation:  3 months  08/25/2017, 08/28/2017, 08/30/2017, 09/01/2017, 09/04/2017  Narrative:  The patient returns today for routine follow-up. She tolerated radiation treatment relatively well without significant acute effects.    She presents today to review findings from recent follow-up MRI brain from 12/07/17 which unfortunately shows a single, small new 2 to 3 mm lesion in the right parietal lobe.  There is no associated edema or mass-effect. Satisfactory right anterior frontal lobe post treatment site with expected evolution.  On review of systems, the patient states that she is doing well overall.  She is without complaints aside from mild residual fatigue.  She has noticed improved energy levels and has been able to return to work.  She denies headaches, decreased visual or auditory acuity, tinnitus, tremor, focal weakness or seizure activity.  She is not having difficulty with speech or word finding.  She denies abdominal pain, N/V or diarrhea.  She remains on Letrozole and has recently started immunotherapy with Lapatinib and is tolerating this well. She reports a healthy appetite and is steadily gaining her weight back which she is pleased with.  ALLERGIES:  is allergic to aspirin; protonix [pantoprazole]; and iodinated diagnostic agents.  Meds: Current Outpatient Medications  Medication Sig Dispense Refill  . albuterol (VENTOLIN HFA) 108 (90 BASE) MCG/ACT inhaler Inhale 2 puffs into the lungs every 6 (six) hours as needed. 1 Inhaler 3  .  budesonide-formoterol (SYMBICORT) 160-4.5 MCG/ACT inhaler Inhale 2 puffs into the lungs daily. 1 Inhaler 11  . lapatinib (TYKERB) 250 MG tablet Take 4 tablets (1,000 mg total) by mouth daily. Take on an empty stomach, 1 hr before or 2 hrs after meals. 150 tablet 3  . letrozole (FEMARA) 2.5 MG tablet Take 1 tablet (2.5 mg total) by mouth daily. 90 tablet 3  . naproxen sodium (ALEVE) 220 MG tablet Take 440 mg by mouth 2 (two) times daily as needed (headache).     . mometasone-formoterol (DULERA) 100-5 MCG/ACT AERO Inhale 1 puff into the lungs daily as needed for shortness of breath.     No current facility-administered medications for this encounter.     Physical Findings:  height is '5\' 2"'$  (1.575 m) and weight is 112 lb (50.8 kg). Her oral temperature is 99.1 F (37.3 C). Her blood pressure is 106/68 and her pulse is 77. Her respiration is 18 and oxygen saturation is 98%.  Pain Assessment Pain Score: 0-No pain/10 In general this is a well appearing caucasian female in no acute distress. She's alert and oriented x4 and appropriate throughout the examination. Cardiopulmonary assessment is negative for acute distress and she exhibits normal effort. She appears grossly neurologically intact with intact EOMs bilaterally and PERRLA.  Speech is fluent and appropriate. Sensation is intact to light touch in the upper and lower extremities and strenght is 5/5 and equal bilaterally in upper and lower extremities.  Her scalp incision is well healed without signs of infection, drainage or bleeding.   Lab Findings: Lab Results  Component Value Date   WBC 8.3 10/18/2017   HGB 11.8 10/18/2017  HCT 34.5 (L) 10/18/2017   MCV 95.4 10/18/2017   PLT 386 10/18/2017     Radiographic Findings: Dg Chest 2 View  Result Date: 11/23/2017 CLINICAL DATA:  Left chest pain with bronchiectasis EXAM: CHEST - 2 VIEW COMPARISON:  08/15/2017 FINDINGS: Normal heart size. Postoperative left chest with stable mediastinal  distortion. There is increased opacification at the left base where there is bronchiectasis by CT. The right lung is clear. IMPRESSION: 1. Increased opacity at the left base. Negative chest CT 07/28/2017 and history of fever/chills suggests this is pneumonia. Followup PA and lateral chest X-ray is recommended in 3-4 weeks following trial of antibiotic therapy to ensure resolution. 2. Left upper lobectomy and postoperative left breast/axilla. Electronically Signed   By: Monte Fantasia M.D.   On: 11/23/2017 09:03   Mr Jeri Cos HY Contrast  Result Date: 12/07/2017 CLINICAL DATA:  52 year old female with solitary right frontal lobe brain mass detected in January and remote history of breast cancer. Underwent gross total resection on 08/05/2017 with tumor pathology revealing metastatic breast cancer. Underwent postoperative SRS treatment in February. Restaging. EXAM: MRI HEAD WITHOUT AND WITH CONTRAST TECHNIQUE: Multiplanar, multiecho pulse sequences of the brain and surrounding structures were obtained without and with intravenous contrast. CONTRAST:  32m MULTIHANCE GADOBENATE DIMEGLUMINE 529 MG/ML IV SOLN COMPARISON:  08/23/2017 and earlier. FINDINGS: BRAIN New Lesions: 2-3 mm enhancing lesion located right parietal lobe with no surrounding edema or mass effect seen on series 10, image 109 (and also postcontrast coronal series 11, image 12 and sagittal series 12, image 12). Larger lesions: None. Stable or Smaller lesions: Partial regression of the cystic resection cavity in the anterior superior right frontal lobe since February. Regional T2 and FLAIR hyperintensity is stable to mildly regressed judging by coronal T2 weighted images (series 9 today). Regressed postcontrast enhancement, with occasional small persistent cortical and curvilinear foci of enhancement around the resection cavity on series 10 images 100 through 112. No regional mass effect. Overlying right anterior frontal convexity dural thickening and  enhancement persists, but remains smooth. The small postoperative extra-axial collection has decreased. Stable regional hemosiderin. Other Brain findings: Stable scattered nonenhancing, nonspecific cerebral white matter T2 and FLAIR hyperintense areas in both hemispheres. No restricted diffusion to suggest acute infarction. No midline shift, mass effect, ventriculomegaly, or acute intracranial hemorrhage. Cervicomedullary junction and pituitary are within normal limits. Vascular: Major intracranial vascular flow voids are stable. Skull and upper cervical spine: Negative visible cervical spine and spinal cord. Visualized bone marrow signal is within normal limits. Sinuses/Orbits: Stable and negative orbits soft tissues. Regressed paranasal sinus mucosal thickening. Other: Internal auditory structures appear stable and within normal limits. Mastoids remain clear. No suspicious scalp or face soft tissue finding. IMPRESSION: 1. New punctate enhancing metastasis in the right parietal lobe, series 10, image 109. No associated edema or mass effect. 2. Satisfactory right anterior frontal lobe post treatment site with expected evolution. 3. Persistent right anterior frontal convexity dural thickening which is felt to be postoperative in nature. Continued attention on future studies. Electronically Signed   By: HGenevie AnnM.D.   On: 12/07/2017 13:50    Impression/Plan: 1. 52y.o. female with solitary right frontal brain metastasis, ER PR positive, HER-2 positive, from invasive ductal carcinoma of the left breast, s/p gross total resection 08/05/17. Today, I talked to the patient and family about the findings and workup thus far. She has a single, new 2 - 3 mm lesion in the right parietal lobe on recent follow-up MRI brain.  We discussed the natural history of metastatic carcinoma and general treatment, highlighting the role of radiotherapy in the management. We discussed the available radiation techniques, and focused on the  details of logistics and delivery of SRS. We reviewed the anticipated acute and late sequelae associated with radiation in this setting. The patient was encouraged to ask questions that were answered to her satisfaction.   At the conclusion of our conversation, she elects to proceed with salvage SRS treatment to the new right parietal lobe lesion.  She freely signed written consent today in the office and a copy of this document was placed in her chart.  She will proceed with CT simulation following our appointment today in preparation to proceed with [facility] treatment on Monday, June 10 at 8 AM.  She is in agreement with and is comfortable with this plan.  She knows to call with any questions or concerns in the interim.      Nicholos Johns, PA-C

## 2017-12-12 NOTE — Progress Notes (Signed)
Has armband been applied?  Yes.    Does patient have an allergy to IV contrast dye?: No.   Has patient ever received premedication for IV contrast dye?: No.   Does patient take metformin?: No.  If patient does take metformin when was the last dose: N/A  Date of lab work: 10/18/2017  BUN: 9 CR: 0.86 GFR >60  IV site: right forearm  Has IV site been added to flowsheet?  No.  LMP 07/25/2012

## 2017-12-14 NOTE — Progress Notes (Signed)
  Radiation Oncology         [phone] ________________________________  Name: Melissa [name] MRN: [id number]  Date: 12/12/2017  DOB: [birth year]  SIMULATION AND TREATMENT PLANNING NOTE    ICD-10-CM   1. Solitary brain metastasis ([place]) C79.31     DIAGNOSIS:  52 yo woman with a 2-3 mm right parietal brain metastasis from cancer of the upper inner left breast.  NARRATIVE:  The patient was brought to the [facility].  Identity was confirmed.  All relevant records and images related to the planned course of therapy were reviewed.  The patient freely provided informed written consent to proceed with treatment after reviewing the details related to the planned course of therapy. The consent form was witnessed and verified by the simulation staff. Intravenous access was established for contrast administration. Then, the patient was set-up in a stable reproducible supine position for radiation therapy.  A relocatable thermoplastic stereotactic head frame was fabricated for precise immobilization.  CT images were obtained.  Surface markings were placed.  The CT images were loaded into the planning software and fused with the patient's targeting MRI scan.  Then the target and avoidance structures were contoured.  Treatment planning then occurred.  The radiation prescription was entered and confirmed.  I have requested 3D planning  I have requested a DVH of the following structures: Brain stem, brain, left eye, right eye, lenses, optic chiasm, target volumes, uninvolved brain, and normal tissue.    SPECIAL TREATMENT PROCEDURE:  The planned course of therapy using radiation constitutes a special treatment procedure. Special care is required in the management of this patient for the following reasons. This treatment constitutes a Special Treatment Procedure for the following reason: High dose per fraction requiring special monitoring for increased toxicities of treatment including daily imaging.   The special nature of the planned course of radiotherapy will require increased physician supervision and oversight to ensure patient's safety with optimal treatment outcomes.  PLAN:  The patient will receive 20 Gy in 1 fraction.  ________________________________  Sheral Apley Tammi Klippel, M.D.

## 2017-12-15 ENCOUNTER — Ambulatory Visit: Payer: 59 | Admitting: Internal Medicine

## 2017-12-15 DIAGNOSIS — Z510 Encounter for antineoplastic radiation therapy: Secondary | ICD-10-CM | POA: Diagnosis not present

## 2017-12-18 ENCOUNTER — Encounter: Payer: Self-pay | Admitting: Radiation Oncology

## 2017-12-18 ENCOUNTER — Ambulatory Visit
Admission: RE | Admit: 2017-12-18 | Discharge: 2017-12-18 | Disposition: A | Discharge status: Home / Self Care | Payer: No Typology Code available for payment source | Source: Ambulatory Visit | Attending: Radiation Oncology | Admitting: Radiation Oncology

## 2017-12-18 VITALS — BP 102/74 | HR 73 | Temp 98.4°F | Resp 18

## 2017-12-18 DIAGNOSIS — Z510 Encounter for antineoplastic radiation therapy: Secondary | ICD-10-CM | POA: Diagnosis not present

## 2017-12-18 DIAGNOSIS — C7931 Secondary malignant neoplasm of brain: Secondary | ICD-10-CM

## 2017-12-18 MED FILL — TYKERB 250 MG TABLET: 250 | 30 days supply | Qty: 120 | Fill #2

## 2017-12-18 NOTE — Progress Notes (Signed)
  Radiation Oncology         [phone] ________________________________  Stereotactic Treatment Procedure Note  Name: MAHALA ROMMEL MRN: [id number]  Date: 12/18/2017  DOB: [birth year]  SPECIAL TREATMENT PROCEDURE    ICD-10-CM   1. Brain metastasis ([place]) C79.31     3D TREATMENT PLANNING AND DOSIMETRY:  The patient's radiation plan was reviewed and approved by neurosurgery and radiation oncology prior to treatment.  It showed 3-dimensional radiation distributions overlaid onto the planning CT/MRI image set.  The [facility] for the target structures as well as the organs at risk were reviewed. The documentation of the 3D plan and dosimetry are filed in the radiation oncology EMR.  NARRATIVE:  DEVANY AJA was brought to the TrueBeam stereotactic radiation treatment machine and placed supine on the CT couch. The head frame was applied, and the patient was set up for stereotactic radiosurgery.  Neurosurgery was present for the set-up and delivery  SIMULATION VERIFICATION:  In the couch zero-angle position, the patient underwent Exactrac imaging using the Brainlab system with orthogonal KV images.  These were carefully aligned and repeated to confirm treatment position for each of the isocenters.  The Exactrac snap film verification was repeated at each couch angle.  PROCEDURE: Craig Guess Strege received stereotactic radiosurgery to the following targets: Right 2 mm parietal target was treated using 2 Dynamic Conformal Arcs to a prescription dose of 20 Gy.  ExacTrac registration was performed for each couch angle.  The 100% isodose line was prescribed.  6 MV X-rays were delivered in the flattening filter free beam mode.  STEREOTACTIC TREATMENT MANAGEMENT:  Following delivery, the patient was transported to nursing in stable condition and monitored for possible acute effects.  Vital signs were recorded BP 102/74 (BP Location: Right Arm, Patient Position: Sitting, Cuff Size: Normal)   Pulse 73   Temp 98.4  F (36.9 C) (Oral)   Resp 18   LMP 07/25/2012   SpO2 99% . The patient tolerated treatment without significant acute effects, and was discharged to home in stable condition.    PLAN: Follow-up in one month.  ________________________________  Sheral Apley. Tammi Klippel, M.D.

## 2017-12-18 NOTE — Op Note (Signed)
  Name: Melissa [name]  MRN: [id number]  Date: 12/18/2017   DOB: 05/19/1966  Stereotactic Radiosurgery Operative Note  PRE-OPERATIVE DIAGNOSIS:  Solitary Brain Metastasis  POST-OPERATIVE DIAGNOSIS:  Solitary Brain Metastasis  PROCEDURE:  Stereotactic Radiosurgery  SURGEON:  Peggyann Shoals, MD  NARRATIVE: The patient underwent a radiation treatment planning session in the radiation oncology simulation suite under the care of the radiation oncology physician and physicist.  I participated closely in the radiation treatment planning afterwards. The patient underwent planning CT which was fused to 3T high resolution MRI with 1 mm axial slices.  These images were fused on the planning system.  We contoured the gross target volumes and subsequently expanded this to yield the Planning Target Volume. I actively participated in the planning process.  I helped to define and review the target contours and also the contours of the optic pathway, eyes, brainstem and selected nearby organs at risk.  All the dose constraints for critical structures were reviewed and compared to AAPM Task Group 101.  The prescription dose conformity was reviewed.  I approved the plan electronically.    Accordingly, Melissa [name] was brought to the TrueBeam stereotactic radiation treatment linac and placed in the custom immobilization mask.  The patient was aligned according to the IR fiducial markers with BrainLab Exactrac, then orthogonal x-rays were used in ExacTrac with the 6DOF robotic table and the shifts were made to align the patient  Melissa [name] received stereotactic radiosurgery uneventfully.    The detailed description of the procedure is recorded in the radiation oncology procedure note.  I was present for the duration of the procedure.  DISPOSITION:  Following delivery, the patient was transported to nursing in stable condition and monitored for possible acute effects to be discharged to home in stable condition with  follow-up in one month.  Peggyann Shoals, MD 12/18/2017 8:13 AM

## 2017-12-18 NOTE — Progress Notes (Signed)
  Radiation Oncology         [phone] ________________________________  Name: Melissa [name] MRN: [id number]  Date: 12/18/2017  DOB: 01-12-1966  End of Treatment Note  Diagnosis:   52 yo woman with a 2-3 mm right parietal brain metastasis from cancer of the upper inner left breast.    Indication for treatment:  Palliative       Radiation treatment dates:   12/18/2017  Site/dose:   PTV2 Rt Parietal treated to 20 Gy in 1 Fx  Beams//energy:   SBRT/SRT-VMAT // 6X-FFF  Narrative: The patient tolerated radiation treatment relatively well.   She was discharged home in stabecondition and without complaints.  Plan: The patient has completed radiation treatment. The patient will return to radiation oncology clinic for routine followup in one month. I advised her to call or return sooner if she has any questions or concerns related to her recovery or treatment. ________________________________  Sheral Apley. Tammi Klippel, M.D.   This document serves as a record of services personally performed by Tyler Pita, MD. It was created on his behalf by Linward Natal, a trained medical scribe. The creation of this record is based on the scribe's personal observations and the provider's statements to them. This document has been checked and approved by the attending provider.

## 2017-12-19 ENCOUNTER — Other Ambulatory Visit: Payer: Self-pay

## 2017-12-19 ENCOUNTER — Inpatient Hospital Stay: Payer: 59

## 2017-12-19 ENCOUNTER — Telehealth: Payer: Self-pay | Admitting: Hematology and Oncology

## 2017-12-19 ENCOUNTER — Inpatient Hospital Stay: Payer: 59 | Attending: Hematology and Oncology | Admitting: Hematology and Oncology

## 2017-12-19 DIAGNOSIS — C7931 Secondary malignant neoplasm of brain: Secondary | ICD-10-CM | POA: Diagnosis present

## 2017-12-19 DIAGNOSIS — C773 Secondary and unspecified malignant neoplasm of axilla and upper limb lymph nodes: Secondary | ICD-10-CM | POA: Diagnosis not present

## 2017-12-19 DIAGNOSIS — Z923 Personal history of irradiation: Secondary | ICD-10-CM | POA: Diagnosis not present

## 2017-12-19 DIAGNOSIS — C50212 Malignant neoplasm of upper-inner quadrant of left female breast: Secondary | ICD-10-CM | POA: Diagnosis not present

## 2017-12-19 DIAGNOSIS — Z170 Estrogen receptor positive status [ER+]: Secondary | ICD-10-CM | POA: Diagnosis not present

## 2017-12-19 LAB — CMP (CANCER CENTER ONLY)
ALBUMIN: 4 g/dL (ref 3.5–5.0)
ALT: 23 U/L (ref 0–55)
AST: 26 U/L (ref 5–34)
Alkaline Phosphatase: 80 U/L (ref 40–150)
Anion gap: 8 (ref 3–11)
BUN: 11 mg/dL (ref 7–26)
CHLORIDE: 104 mmol/L (ref 98–109)
CO2: 27 mmol/L (ref 22–29)
Calcium: 10.1 mg/dL (ref 8.4–10.4)
Creatinine: 0.97 mg/dL (ref 0.60–1.10)
GFR, Est AFR Am: >60 mL/min (ref >60)
GFR, Estimated: >60 mL/min (ref >60)
GLUCOSE: 87 mg/dL (ref 70–140)
POTASSIUM: 4.3 mmol/L (ref 3.5–5.1)
SODIUM: 139 mmol/L (ref 136–145)
Total Bilirubin: 0.3 mg/dL (ref 0.2–1.2)
Total Protein: 7.4 g/dL (ref 6.4–8.3)

## 2017-12-19 LAB — CBC WITH DIFFERENTIAL (CANCER CENTER ONLY)
BASOS ABS: 0.1 10*3/uL (ref 0.0–0.1)
BASOS PCT: 2 %
Eosinophils Absolute: 0.5 10*3/uL (ref 0.0–0.5)
Eosinophils Relative: 9 %
HEMATOCRIT: 35.6 % (ref 34.8–46.6)
Hemoglobin: 11.9 g/dL (ref 11.6–15.9)
Lymphocytes Relative: 39 %
Lymphs Abs: 2.2 10*3/uL (ref 0.9–3.3)
MCH: 30.6 pg (ref 25.1–34.0)
MCHC: 33.5 g/dL (ref 31.5–36.0)
MCV: 91.2 fL (ref 79.5–101.0)
MONO ABS: 0.4 10*3/uL (ref 0.1–0.9)
Monocytes Relative: 7 %
NEUTROS ABS: 2.4 10*3/uL (ref 1.5–6.5)
Neutrophils Relative %: 43 %
PLATELETS: 289 10*3/uL (ref 145–400)
RBC: 3.91 MIL/uL (ref 3.70–5.45)
RDW: 14.1 % (ref 11.2–14.5)
WBC Count: 5.7 10*3/uL (ref 3.9–10.3)

## 2017-12-19 MED ORDER — PREDNISONE 50 MG PO TABS: ORAL_TABLET | ORAL | 0 refills | Status: DC

## 2017-12-19 NOTE — Assessment & Plan Note (Signed)
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 Anti Her 2 therapy with Lapatinibstarted 09/17/2017 3.I discontinuedtamoxifen and started her on letrozole 2.5 mg daily.  Lapatinib toxicities:Currently on 1000 mglapatinib  Occasional diarrhea. She is working and is very happy about it  Return to clinic in8 weeksfor follow-up Brain MRI 12/07/2017: New punctate enhancing metastases right parietal lobe.  Anterior frontal lobe treatment changes.  Postsurgical changes right frontal lobe.  Stereotactic radiosurgery 12/19/2017 to the new right parietal lobe metastases.  Continue with current treatment. I plan to obtain CT chest abdomen pelvis in 1 month and follow-up

## 2017-12-19 NOTE — Addendum Note (Signed)
Encounter addended by: Heywood Footman, RN on: 12/19/2017 9:45 AM  Actions taken: Sign clinical note

## 2017-12-19 NOTE — Progress Notes (Signed)
Late entry from 12/18/2017 at 0900. Patient in no distress. Patient continues to deny headache, dizziness, nausea, diplopia or tinnitus. Patient discharged. Ambulatory.

## 2017-12-19 NOTE — Progress Notes (Signed)
Late entry from 12/18/17 at 0822. Received patient in the clinic for nurse monitoring following SRS treatment. Patient understands she will remain for monitoring for thirty minutes. Patient A&O x 3. Denies pain. Denies headache, dizziness, nausea, diplopia or tinnitus. Patient understands to avoid strenuous activity for the next 24 hours. In addition, patient understands to call [phone] and ask for the radiation oncologist on call with needs.

## 2017-12-19 NOTE — Telephone Encounter (Signed)
Gave avs and calendar ° °

## 2017-12-19 NOTE — Progress Notes (Signed)
Called pt to let her know that her CT scans were scheduled for 7/11. Instructions emailed through [employer]. Pt verbalized understanding and will call for any clarification. Told pt that she will need to come and pick up her oral contrast and drink prior to her Ct scans.

## 2017-12-19 NOTE — Progress Notes (Signed)
Patient Care Team: [name], MD as PCP - General (Internal Medicine) [name], MD as Referring Physician (Emergency Medicine)  DIAGNOSIS:  Encounter Diagnosis  Name Primary?  . Malignant neoplasm of upper-inner quadrant of left breast in female, estrogen receptor positive ([place])     SUMMARY OF ONCOLOGIC HISTORY:   Breast cancer of upper-inner quadrant of left female breast ([place])   06/08/2012 Initial Diagnosis    invasive ductal carcinoma that was ER positive PR positive HER-2/neu positive measuring 3.1 cm by MRI criteria. Ki-67 was 70% HER-2 was amplified with a ratio 2.91      07/12/2012 - 07/17/2013 Neo-Adjuvant Chemotherapy    TCH 6 followed by Herceptin maintenance      12/11/2012 Surgery    Left breast lumpectomy: 1.8 cm tumor 1 positive sentinel node, axillary lymph node dissection 02/08/2013 showed 0/13 lymph nodes      03/25/2013 - 05/06/2013 Radiation Therapy    Adjuvant radiation therapy      06/05/2013 - 07/20/2017 Anti-estrogen oral therapy    Tamoxifen 20 mg daily      07/27/2017 Relapse/Recurrence    MRI Brain: 3.4 x 2.9 x 2.9 cm RIGHT frontal lobe mass with imaging characteristics of solitary metastasis. Extensive vasogenic edema resulting in 9 mm RIGHT to LEFT midline shift. Equivocal very early LEFT ventricle entrapment.       08/04/2017 Surgery    Rt frontal brain resection: Poorly differentiated tumor IHC suggests breast primary ER and PR Positive      08/25/2017 - 09/04/2017 Radiation Therapy    Stereotactic radiation      09/18/2017 -  Anti-estrogen oral therapy    Lapatinib with letrozole      12/18/2017 - 12/19/2017 Radiation Therapy    New right parietal lobe metastases status post SRS       CHIEF COMPLIANT: New right parietal lobe metastasis status post SRS  INTERVAL HISTORY: Melissa [name] is a 52 year old with above-mentioned history of metastatic breast cancer with brain metastases who initially had right frontal brain  resection followed by stereotactic radiation and has been on lapatinib with letrozole.  Recent MRI on 12/07/2017 revealed a new right parietal lobe lesion and she underwent SRS on 12/18/2017.  She has been tolerating lapatinib fairly well without any diarrhea.  REVIEW OF SYSTEMS:   Constitutional: Denies fevers, chills or abnormal weight loss Eyes: Denies blurriness of vision Ears, nose, mouth, throat, and face: Denies mucositis or sore throat Respiratory: Denies cough, dyspnea or wheezes Cardiovascular: Denies palpitation, chest discomfort Gastrointestinal:  Denies nausea, heartburn or change in bowel habits Skin: Denies abnormal skin rashes Lymphatics: Denies new lymphadenopathy or easy bruising Neurological:Denies numbness, tingling or new weaknesses Behavioral/Psych: Mood is stable, no new changes  Extremities: No lower extremity edema All other systems were reviewed with the patient and are negative.  I have reviewed the past medical history, past surgical history, social history and family history with the patient and they are unchanged from previous note.  ALLERGIES:  is allergic to aspirin; protonix [pantoprazole]; and iodinated diagnostic agents.  MEDICATIONS:  Current Outpatient Medications  Medication Sig Dispense Refill  . albuterol (VENTOLIN HFA) 108 (90 BASE) MCG/ACT inhaler Inhale 2 puffs into the lungs every 6 (six) hours as needed. 1 Inhaler 3  . budesonide-formoterol (SYMBICORT) 160-4.5 MCG/ACT inhaler Inhale 2 puffs into the lungs daily. 1 Inhaler 11  . lapatinib (TYKERB) 250 MG tablet Take 4 tablets (1,000 mg total) by mouth daily. Take on an empty stomach, 1 hr before or  2 hrs after meals. 150 tablet 3  . letrozole (FEMARA) 2.5 MG tablet Take 1 tablet (2.5 mg total) by mouth daily. 90 tablet 3  . mometasone-formoterol (DULERA) 100-5 MCG/ACT AERO Inhale 1 puff into the lungs daily as needed for shortness of breath.    . naproxen sodium (ALEVE) 220 MG tablet Take 440 mg  by mouth 2 (two) times daily as needed (headache).     . predniSONE (DELTASONE) 50 MG tablet Take one '50mg'$  tablet 13 hrs, 7hrs, and 1 hr prior to CT Scan. (Premedication for dye allergy) 3 tablet 0   No current facility-administered medications for this visit.     PHYSICAL EXAMINATION: ECOG PERFORMANCE STATUS: 1 - Symptomatic but completely ambulatory  Vitals:   12/19/17 1509  BP: 107/74  Pulse: 75  Resp: 18  Temp: 98.4 F (36.9 C)  SpO2: 100%   Filed Weights   12/19/17 1509  Weight: 110 lb 6.4 oz (50.1 kg)    GENERAL:alert, no distress and comfortable SKIN: skin color, texture, turgor are normal, no rashes or significant lesions EYES: normal, Conjunctiva are pink and non-injected, sclera clear OROPHARYNX:no exudate, no erythema and lips, buccal mucosa, and tongue normal  NECK: supple, thyroid normal size, non-tender, without nodularity LYMPH:  no palpable lymphadenopathy in the cervical, axillary or inguinal LUNGS: clear to auscultation and percussion with normal breathing effort HEART: regular rate & rhythm and no murmurs and no lower extremity edema ABDOMEN:abdomen soft, non-tender and normal bowel sounds MUSCULOSKELETAL:no cyanosis of digits and no clubbing  NEURO: alert & oriented x 3 with fluent speech, no focal motor/sensory deficits EXTREMITIES: No lower extremity edema  LABORATORY DATA:  I have reviewed the data as listed CMP Latest Ref Rng & Units 12/19/2017 10/18/2017 10/06/2017  Glucose 70 - 140 mg/dL 87 110 80  BUN 7 - 26 mg/dL '11 9 9  '$ Creatinine 0.60 - 1.10 mg/dL 0.97 0.86 0.78  Sodium 136 - 145 mmol/L 139 139 140  Potassium 3.5 - 5.1 mmol/L 4.3 3.7 4.1  Chloride 98 - 109 mmol/L 104 105 106  CO2 22 - 29 mmol/L '27 27 27  '$ Calcium 8.4 - 10.4 mg/dL 10.1 9.7 9.7  Total Protein 6.4 - 8.3 g/dL 7.4 7.1 6.9  Total Bilirubin 0.2 - 1.2 mg/dL 0.3 <0.2(L) <0.2(L)  Alkaline Phos 40 - 150 U/L 80 72 76  AST 5 - 34 U/L '26 15 28  '$ ALT 0 - 55 U/L 23 15 38    Lab Results    Component Value Date   WBC 5.7 12/19/2017   HGB 11.9 12/19/2017   HCT 35.6 12/19/2017   MCV 91.2 12/19/2017   PLT 289 12/19/2017   NEUTROABS 2.4 12/19/2017    ASSESSMENT & PLAN:  Breast cancer of upper-inner quadrant of left female breast ([facility]) 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 Anti Her 2 therapy with Lapatinibstarted 09/17/2017 3.I discontinuedtamoxifen and started her on letrozole 2.5 mg daily.  Lapatinib toxicities:Currently on 1000 mglapatinib  Occasional diarrhea. She is working and is very happy about it  Return to clinic in8 weeksfor follow-up Brain MRI 12/07/2017: New punctate enhancing metastases right parietal lobe.  Anterior frontal lobe  treatment changes.  Postsurgical changes right frontal lobe.  Stereotactic radiosurgery 12/19/2017 to the new right parietal lobe metastases.  Continue with current treatment. I plan to obtain CT chest abdomen pelvis in 1 month and follow-up      Orders Placed This Encounter  Procedures  . CT Abdomen Pelvis W Contrast    Standing Status:   Future    Standing Expiration Date:   12/19/2018    Order Specific Question:   If indicated for the ordered procedure, I authorize the administration of contrast media per Radiology protocol    Answer:   Yes    Order Specific Question:   Is patient pregnant?    Answer:   No    Order Specific Question:   Preferred imaging location?    Answer:   [facility]    Order Specific Question:   Is Oral Contrast requested for this exam?     Answer:   Yes, Per Radiology protocol    Order Specific Question:   Radiology Contrast Protocol - do NOT remove file path    Answer:   [email or web address]    Order Specific Question:   Reason for Exam additional comments    Answer:   Metastatic breast cancer restaging  . CT Chest W Contrast    Standing Status:   Future    Standing Expiration Date:   12/19/2018    Order Specific Question:   If indicated for the ordered procedure, I authorize the administration of contrast media per Radiology protocol    Answer:   Yes    Order Specific Question:   Is patient pregnant?    Answer:   No    Order Specific Question:   Preferred imaging location?    Answer:   [facility]    Order Specific Question:   Radiology Contrast Protocol - do NOT remove file path    Answer:   [email or web address]    Order Specific Question:   Reason for Exam additional comments    Answer:   Met breast cancer restaging  . CBC with Differential (Cancer Center Only)    Standing Status:   Future    Standing Expiration Date:   12/20/2018  . CMP ([place] only)    Standing Status:   Future    Standing Expiration Date:   12/20/2018   The patient has a good understanding of the overall plan. she agrees with it. she will call with any problems that may develop before the next visit here.   Harriette Ohara, MD 12/19/17

## 2018-01-03 ENCOUNTER — Ambulatory Visit: Payer: 59 | Admitting: Internal Medicine

## 2018-01-16 MED FILL — TYKERB 250 MG TABLET: 250 | 30 days supply | Qty: 120 | Fill #3

## 2018-01-18 ENCOUNTER — Ambulatory Visit (HOSPITAL_COMMUNITY)
Admission: RE | Admit: 2018-01-18 | Discharge: 2018-01-18 | Disposition: A | Discharge status: Home / Self Care | Payer: 59 | Source: Ambulatory Visit | Attending: Hematology and Oncology | Admitting: Hematology and Oncology

## 2018-01-18 ENCOUNTER — Encounter (HOSPITAL_COMMUNITY): Payer: Self-pay

## 2018-01-18 ENCOUNTER — Inpatient Hospital Stay: Payer: 59 | Attending: Hematology and Oncology

## 2018-01-18 DIAGNOSIS — R918 Other nonspecific abnormal finding of lung field: Secondary | ICD-10-CM | POA: Diagnosis not present

## 2018-01-18 DIAGNOSIS — Z720 Tobacco use: Secondary | ICD-10-CM | POA: Diagnosis not present

## 2018-01-18 DIAGNOSIS — J479 Bronchiectasis, uncomplicated: Secondary | ICD-10-CM | POA: Diagnosis not present

## 2018-01-18 DIAGNOSIS — K5730 Diverticulosis of large intestine without perforation or abscess without bleeding: Secondary | ICD-10-CM | POA: Insufficient documentation

## 2018-01-18 DIAGNOSIS — C50212 Malignant neoplasm of upper-inner quadrant of left female breast: Secondary | ICD-10-CM | POA: Insufficient documentation

## 2018-01-18 DIAGNOSIS — Z170 Estrogen receptor positive status [ER+]: Secondary | ICD-10-CM | POA: Insufficient documentation

## 2018-01-18 DIAGNOSIS — C7931 Secondary malignant neoplasm of brain: Secondary | ICD-10-CM | POA: Insufficient documentation

## 2018-01-18 DIAGNOSIS — Z902 Acquired absence of lung [part of]: Secondary | ICD-10-CM | POA: Diagnosis not present

## 2018-01-18 DIAGNOSIS — C773 Secondary and unspecified malignant neoplasm of axilla and upper limb lymph nodes: Secondary | ICD-10-CM | POA: Insufficient documentation

## 2018-01-18 DIAGNOSIS — J439 Emphysema, unspecified: Secondary | ICD-10-CM | POA: Insufficient documentation

## 2018-01-18 DIAGNOSIS — I700 Atherosclerosis of aorta: Secondary | ICD-10-CM | POA: Insufficient documentation

## 2018-01-18 LAB — CMP (CANCER CENTER ONLY)
ALT: 19 U/L (ref 0–44)
AST: 15 U/L (ref 15–41)
Albumin: 3.8 g/dL (ref 3.5–5.0)
Alkaline Phosphatase: 88 U/L (ref 38–126)
Anion gap: 11 (ref 5–15)
BUN: 11 mg/dL (ref 6–20)
CHLORIDE: 102 mmol/L (ref 98–111)
CO2: 28 mmol/L (ref 22–32)
Calcium: 10.9 mg/dL — ABNORMAL HIGH (ref 8.9–10.3)
Creatinine: 1.03 mg/dL — ABNORMAL HIGH (ref 0.44–1.00)
Glucose, Bld: 120 mg/dL — ABNORMAL HIGH (ref 70–99)
POTASSIUM: 4.5 mmol/L (ref 3.5–5.1)
Sodium: 141 mmol/L (ref 135–145)
Total Bilirubin: 0.2 mg/dL — ABNORMAL LOW (ref 0.3–1.2)
Total Protein: 8.3 g/dL — ABNORMAL HIGH (ref 6.5–8.1)

## 2018-01-18 LAB — CBC WITH DIFFERENTIAL (CANCER CENTER ONLY)
Basophils Absolute: 0 10*3/uL (ref 0.0–0.1)
Basophils Relative: 0 %
Eosinophils Absolute: 0 10*3/uL (ref 0.0–0.5)
Eosinophils Relative: 0 %
HCT: 36.3 % (ref 34.8–46.6)
HEMOGLOBIN: 12 g/dL (ref 11.6–15.9)
LYMPHS ABS: 1.2 10*3/uL (ref 0.9–3.3)
Lymphocytes Relative: 16 %
MCH: 30 pg (ref 25.1–34.0)
MCHC: 33.1 g/dL (ref 31.5–36.0)
MCV: 90.8 fL (ref 79.5–101.0)
Monocytes Absolute: 0 10*3/uL — ABNORMAL LOW (ref 0.1–0.9)
Monocytes Relative: 1 %
NEUTROS ABS: 6.2 10*3/uL (ref 1.5–6.5)
Neutrophils Relative %: 83 %
Platelet Count: 434 10*3/uL — ABNORMAL HIGH (ref 145–400)
RBC: 4 MIL/uL (ref 3.70–5.45)
RDW: 15.7 % — ABNORMAL HIGH (ref 11.2–14.5)
WBC: 7.4 10*3/uL (ref 3.9–10.3)

## 2018-01-18 MED ORDER — IOPAMIDOL (ISOVUE-300) INJECTION 61%
100.0000 mL | Freq: Once | INTRAVENOUS | Status: AC | PRN
  Administered 2018-01-18: 100 mL via INTRAVENOUS

## 2018-01-18 MED ORDER — IOPAMIDOL (ISOVUE-300) INJECTION 61%
INTRAVENOUS | Status: AC
  Filled 2018-01-18: qty 100

## 2018-01-22 ENCOUNTER — Telehealth: Payer: Self-pay | Admitting: Hematology and Oncology

## 2018-01-22 ENCOUNTER — Inpatient Hospital Stay (HOSPITAL_BASED_OUTPATIENT_CLINIC_OR_DEPARTMENT_OTHER): Payer: 59 | Admitting: Hematology and Oncology

## 2018-01-22 ENCOUNTER — Encounter: Payer: Self-pay | Admitting: Hematology and Oncology

## 2018-01-22 VITALS — BP 97/54 | HR 81 | Temp 98.8°F | Resp 18 | Ht 62.0 in | Wt 111.0 lb

## 2018-01-22 DIAGNOSIS — J439 Emphysema, unspecified: Secondary | ICD-10-CM | POA: Insufficient documentation

## 2018-01-22 DIAGNOSIS — I700 Atherosclerosis of aorta: Secondary | ICD-10-CM | POA: Diagnosis not present

## 2018-01-22 DIAGNOSIS — R911 Solitary pulmonary nodule: Secondary | ICD-10-CM

## 2018-01-22 DIAGNOSIS — J438 Other emphysema: Secondary | ICD-10-CM

## 2018-01-22 DIAGNOSIS — Z170 Estrogen receptor positive status [ER+]: Secondary | ICD-10-CM

## 2018-01-22 DIAGNOSIS — C50212 Malignant neoplasm of upper-inner quadrant of left female breast: Secondary | ICD-10-CM

## 2018-01-22 NOTE — Assessment & Plan Note (Addendum)
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 Anti Her 2 therapy with Lapatinibstarted 09/17/2017 3.I discontinuedtamoxifen and started her on letrozole 2.5 mg daily.  Lapatinib toxicities:Currently on 1000 mglapatinib  Occasional diarrhea. She is working and is very happy about it  Brain MRI 12/07/2017: New punctate enhancing metastases right parietal lobe.  Anterior frontal lobe treatment changes.  Postsurgical changes right frontal lobe.  Stereotactic radiosurgery 12/19/2017 to the new right parietal lobe metastases.  Dr. Lindi Adie reviewed her scans with her which show an enlarging pulmonary nodule to 1.4 cm.  Aortic atherosclerosis and emphysema were also noted.  We will repeat a CT chest in 2 months.  She will see Dr. Lindi Adie after this scan to review results.  She will continue on Lapatinib and Letrozole, she is tolerating them well.  She will f/u with radiation oncology after brain MRI in August.

## 2018-01-22 NOTE — Progress Notes (Addendum)
[facility]:    [name], MD 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 Ki-67 was 70% HER-2 was amplified with a ratio 2.91      07/12/2012 - 07/17/2013 Neo-Adjuvant Chemotherapy    TCH 6 followed by Herceptin maintenance      12/11/2012 Surgery    Left breast lumpectomy: 1.8 cm tumor 1 positive sentinel node, axillary lymph node dissection 02/08/2013 showed 0/13 lymph nodes      03/25/2013 - 05/06/2013 Radiation Therapy    Adjuvant radiation therapy      06/05/2013 - 07/20/2017 Anti-estrogen oral therapy    Tamoxifen 20 mg daily      07/27/2017 Relapse/Recurrence    MRI Brain: 3.4 x 2.9 x 2.9 cm RIGHT frontal lobe mass with imaging characteristics of solitary metastasis. Extensive vasogenic edema resulting in 9 mm RIGHT to LEFT midline shift. Equivocal very early LEFT ventricle entrapment.       08/04/2017 Surgery    Rt frontal brain resection: Poorly differentiated tumor IHC suggests breast primary ER and PR Positive      08/25/2017 - 09/04/2017 Radiation Therapy    Stereotactic radiation      09/18/2017 -  Anti-estrogen oral therapy    Lapatinib with letrozole      12/18/2017 - 12/19/2017 Radiation Therapy    New right parietal lobe metastases status post SRS       CURRENT THERAPY: Letrozole and Lapatinib  INTERVAL HISTORY: Melissa [name] 52 y.o. female returns for evaluation of her recurrent  breast cancer to the brain.  She is doing well today on Letrozole and Lapatinib.  She has one loose bowel movement a day.  She denies any significant diarrhea.  She uses ETOH sparingly, she is still smoking about a 1/2 ppd of cigarettes this is less than prior.  She has a 30 pack year tobacco history.  She is exercising regularly, and continues to work full time.   Patient Active Problem List   Diagnosis Date Noted  . Breast cancer of upper-inner quadrant of left female breast ([place]) 06/08/2012    Priority: High  . Aortic atherosclerosis ([place]) 01/22/2018  . Emphysema lung ([place]) 01/22/2018  . CAP (community acquired pneumonia) 11/23/2017  . Metastasis to brain ([place]) 08/04/2017  . Brain metastasis ([place]) 07/27/2017  . Nicotine abuse 07/27/2017  . Migraines 07/27/2017  . HCAP (healthcare-associated pneumonia) 11/26/2016  . Upper airway cough syndrome 08/10/2016  . Abnormal echocardiogram 08/07/2013  . Chest tightness or pressure 08/07/2013  . Hx of radiation therapy   . Edema of left lower extremity 11/19/2012  . Tachycardia 09/20/2012  . Obstructive bronchiectasis ([place]) 01/26/2011  . Cigarette smoker 01/26/2011    is allergic to aspirin; protonix [pantoprazole]; and iodinated diagnostic agents.  MEDICAL HISTORY: Past Medical History:  Diagnosis Date  . Anemia   . Arthritis    knees and hips  . Asthma   . Breast cancer ([place])   . Bronchiolitis   .  Cancer [facility])    breast cancer 2014  . Complication of anesthesia    bp dropped + desat   . COPD (chronic obstructive pulmonary disease) ([place])   . GERD (gastroesophageal reflux disease)   . H/O coccidioidomycosis    was reason for lung lobectomy  . Headache(784.0)    due to eye strain or not eating  . History of anemia    no current problem  . History of asthma    as a child  . History of breast cancer 2014   left  . History of chemotherapy    finished 07/17/2013  . History of hiatal hernia   . Hx of radiation therapy  03/25/13-05/06/13   left breast 5000 cGy/25 sessions, left breast boost 1000 cGy/5 sessions  . Runny nose 07/30/2013   clear drainage  . Wears dentures    upper    SURGICAL HISTORY: Past Surgical History:  Procedure Laterality Date  . APPLICATION OF CRANIAL NAVIGATION N/A 08/04/2017   Procedure: APPLICATION OF CRANIAL NAVIGATION;  Surgeon: [name], [name], MD;  Location: [facility];  Service: Neurosurgery;  Laterality: N/A;  . AXILLARY LYMPH NODE DISSECTION Left 02/08/2013   Procedure: LEFT AXILLARY DISSECTION;  Surgeon: Edward Jolly, MD;  Location: [facility];  Service: General;  Laterality: Left;  . BREAST CYST EXCISION Right 2006  . BREAST LUMPECTOMY Left 2014  . BREAST LUMPECTOMY WITH NEEDLE LOCALIZATION AND AXILLARY SENTINEL LYMPH NODE BX Left 12/31/2012   Procedure: NEEDLE LOCALIZATION LEFT BREAST LUMPECTOMY AND LEFT AXILLARY SENTENIAL LYMPH NODE BX;  Surgeon: Edward Jolly, MD;  Location: [facility];  Service: General;  Laterality: Left;  . CESAREAN SECTION  1995/1996  . CRANIOTOMY Right 08/04/2017   Procedure: Right Frontal craniotomy for resection of tumor with stereotactic navigation;  Surgeon: [name], [name], MD;  Location: [facility];  Service: Neurosurgery;  Laterality: Right;  Right Frontal craniotomy for resection of tumor with stereotactic navigation  . LUNG LOBECTOMY Left 05/1996   upper lobe - due to [facility] Fever  . PORT-A-CATH REMOVAL Right 08/02/2013   Procedure: REMOVAL PORT-A-CATH;  Surgeon: Edward Jolly, MD;  Location: [facility];  Service: General;  Laterality: Right;  . PORTACATH PLACEMENT  07/02/2012   Procedure: INSERTION PORT-A-CATH;  Surgeon: Edward Jolly, MD;  Location: [facility];  Service: General;  Laterality: N/A;  right    SOCIAL HISTORY: Social History   Socioeconomic History  . Marital status: Married    Spouse name: Not on file  . Number of children: 2  . Years  of education: Not on file  . Highest education level: Not on file  Occupational History  . Occupation: Secondary school teacher  Social Needs  . Financial resource strain: Not on file  . Food insecurity:    Worry: Not on file    Inability: Not on file  . Transportation needs:    Medical: Not on file    Non-medical: Not on file  Tobacco Use  . Smoking status: Current Every Day Smoker    Packs/day: 0.50    Years: 24.00    Pack years: 12.00    Types: Cigarettes  . Smokeless tobacco: Never Used  Substance and Sexual Activity  . Alcohol use: No  . Drug use: No  . Sexual activity: Yes    Comment: trying to quit  Lifestyle  . Physical activity:    Days per week: Not on file    Minutes per session: Not  on file  . Stress: Not on file  Relationships  . Social connections:    Talks on phone: Not on file    Gets together: Not on file    Attends religious service: Not on file    Active member of club or organization: Not on file    Attends meetings of clubs or organizations: Not on file    Relationship status: Not on file  . Intimate partner violence:    Fear of current or ex partner: Not on file    Emotionally abused: Not on file    Physically abused: Not on file    Forced sexual activity: Not on file  Other Topics Concern  . Not on file  Social History Narrative  . Not on file    FAMILY HISTORY: Family History  Problem Relation Age of Onset  . Emphysema Mother        was a smoker  . Heart disease Mother   . Melanoma Mother        dx in her 39s  . Breast cancer Mother 9  . Asthma Brother   . Breast cancer Cousin        mother's maternal cousin; dx in her 64s    Review of Systems  Constitutional: Negative for appetite change, chills, fatigue, fever and unexpected weight change.  HENT:   Negative for hearing loss and lump/mass.   Eyes: Negative for eye problems and icterus.  Respiratory: Negative for chest tightness, cough and shortness of breath.   Cardiovascular:  Negative for chest pain, leg swelling and palpitations.  Gastrointestinal: Positive for diarrhea (per interval history). Negative for abdominal distention, abdominal pain, constipation, nausea and vomiting.  Endocrine: Negative for hot flashes.  Neurological: Negative for dizziness, extremity weakness, headaches and numbness.  Hematological: Negative for adenopathy.  Psychiatric/Behavioral: Negative for depression. The patient is not nervous/anxious.       PHYSICAL EXAMINATION  ECOG PERFORMANCE STATUS: 1 - Symptomatic but completely ambulatory  Vitals:   01/22/18 1522  BP: (!) 97/54  Pulse: 81  Resp: 18  Temp: 98.8 F (37.1 C)  SpO2: 100%    Physical Exam  Constitutional: She is oriented to person, place, and time. She appears well-developed and well-nourished.  HENT:  Head: Normocephalic and atraumatic.  Mouth/Throat: Oropharynx is clear and moist. No oropharyngeal exudate.  Eyes: Pupils are equal, round, and reactive to light. No scleral icterus.  Neck: Neck supple.  Cardiovascular: Normal rate, regular rhythm and normal heart sounds. Exam reveals no gallop and no friction rub.  No murmur heard. Pulmonary/Chest: Effort normal. No stridor. No respiratory distress. She has wheezes. She has no rales.  Abdominal: Soft. Bowel sounds are normal.  Musculoskeletal: She exhibits no edema.  Lymphadenopathy:    She has no cervical adenopathy.  Neurological: She is alert and oriented to person, place, and time.  Skin: Skin is warm and dry. Capillary refill takes less than 2 seconds. No rash noted.  Psychiatric: She has a normal mood and affect.    LABORATORY DATA:  CBC    Component Value Date/Time   WBC 7.4 01/18/2018 1147   WBC 13.3 (H) 08/15/2017 1010   RBC 4.00 01/18/2018 1147   HGB 12.0 01/18/2018 1147   HGB 13.1 08/12/2014 0955   HCT 36.3 01/18/2018 1147   HCT 40.4 08/12/2014 0955   PLT 434 (H) 01/18/2018 1147   PLT 228 08/12/2014 0955   MCV 90.8 01/18/2018 1147    MCV 98.2 08/12/2014 0955   MCH  30.0 01/18/2018 1147   MCHC 33.1 01/18/2018 1147   RDW 15.7 (H) 01/18/2018 1147   RDW 13.0 08/12/2014 0955   LYMPHSABS 1.2 01/18/2018 1147   LYMPHSABS 2.0 08/12/2014 0955   MONOABS 0.0 (L) 01/18/2018 1147   MONOABS 0.5 08/12/2014 0955   EOSABS 0.0 01/18/2018 1147   EOSABS 0.2 08/12/2014 0955   BASOSABS 0.0 01/18/2018 1147   BASOSABS 0.0 08/12/2014 0955    CMP     Component Value Date/Time   NA 141 01/18/2018 1147   NA 142 08/12/2014 0956   K 4.5 01/18/2018 1147   K 4.6 08/12/2014 0956   CL 102 01/18/2018 1147   CL 100 12/20/2012 1509   CO2 28 01/18/2018 1147   CO2 26 08/12/2014 0956   GLUCOSE 120 (H) 01/18/2018 1147   GLUCOSE 94 08/12/2014 0956   GLUCOSE 98 12/20/2012 1509   BUN 11 01/18/2018 1147   BUN 10.1 08/12/2014 0956   CREATININE 1.03 (H) 01/18/2018 1147   CREATININE 0.9 08/12/2014 0956   CALCIUM 10.9 (H) 01/18/2018 1147   CALCIUM 9.3 08/12/2014 0956   PROT 8.3 (H) 01/18/2018 1147   PROT 7.1 08/12/2014 0956   ALBUMIN 3.8 01/18/2018 1147   ALBUMIN 3.8 08/12/2014 0956   AST 15 01/18/2018 1147   AST 15 08/12/2014 0956   ALT 19 01/18/2018 1147   ALT 11 08/12/2014 0956   ALKPHOS 88 01/18/2018 1147   ALKPHOS 68 08/12/2014 0956   BILITOT 0.2 (L) 01/18/2018 1147   BILITOT 0.25 08/12/2014 0956   GFRNONAA >60 01/18/2018 1147   GFRAA >60 01/18/2018 1147       ASSESSMENT and PLAN:   Breast cancer of upper-inner quadrant of left female breast ([place]) Left breast invasive ductal carcinoma ER/PR positive HER-2 positive initially 3.1 cm, Ki-67 70%, HER-2 amplified ratio 2.91 status post neoadjuvant chemotherapy followed by surgery which showed 1.8 cm tumor 1 positive sentinel lymph node T1cN1 M0 stage IB status post radiation therapy and Herceptin maintenanceand tooktamoxifen 06/05/2013-08/11/2017  Brain Metastasis: S/P resection of frontal lobe metER PR positive, HER-2 positive  Plan: 1.SIRSbrain: 08/25/2017-09/04/2017 2. Anti  Her 2 therapy with Lapatinibstarted 09/17/2017 3.I discontinuedtamoxifen and started her on letrozole 2.5 mg daily.  Lapatinib toxicities:Currently on 1000 mglapatinib  Occasional diarrhea. She is working and is very happy about it  Brain MRI 12/07/2017: New punctate enhancing metastases right parietal lobe.  Anterior frontal lobe treatment changes.  Postsurgical changes right frontal lobe.  Stereotactic radiosurgery 12/19/2017 to the new right parietal lobe metastases.  Dr. Lindi Adie reviewed her scans with her which show an enlarging pulmonary nodule to 1.4 cm.  Aortic atherosclerosis and emphysema were also noted.  We will repeat a CT chest in 2 months.  She will see Dr. Lindi Adie after this scan to review results.  She will continue on Lapatinib and Letrozole, she is tolerating them well.  She will f/u with radiation oncology after brain MRI in August.         Orders Placed This Encounter  Procedures  . CT Chest W Contrast    Standing Status:   Future    Standing Expiration Date:   01/22/2019    Order Specific Question:   If indicated for the ordered procedure, I authorize the administration of contrast media per Radiology protocol    Answer:   Yes    Order Specific Question:   Is patient pregnant?    Answer:   No    Order Specific Question:   Preferred imaging location?  Answer:   [facility]    Order Specific Question:   Radiology Contrast Protocol - do NOT remove file path    Answer:   [email or web address]    All questions were answered. The patient knows to call the clinic with any problems, questions or concerns. We can certainly see the patient much sooner if necessary. This note was electronically signed. Scot Dock, NP 01/22/2018   Attending Note  I personally saw and examined Melissa [name]. The plan of care was discussed with her. I agree with the assessment and plan as documented above. Lung nodule: Increased from 1.1 to 1.4  cm.  Like to obtain another CT chest in 2 months and follow-up on this nodule. Continue with lapatinib.  Monitoring closely for toxicities. Signed Harriette Ohara, MD

## 2018-01-22 NOTE — Telephone Encounter (Signed)
Gave avs and calendar ° °

## 2018-01-24 ENCOUNTER — Encounter: Payer: Self-pay | Admitting: Urology

## 2018-01-24 ENCOUNTER — Ambulatory Visit
Admission: RE | Admit: 2018-01-24 | Discharge: 2018-01-24 | Disposition: A | Discharge status: Home / Self Care | Payer: 59 | Source: Ambulatory Visit | Attending: Urology | Admitting: Urology

## 2018-01-24 VITALS — BP 107/78 | HR 85 | Temp 98.3°F | Resp 20 | Ht 62.0 in | Wt 111.2 lb

## 2018-01-24 DIAGNOSIS — Z7951 Long term (current) use of inhaled steroids: Secondary | ICD-10-CM | POA: Insufficient documentation

## 2018-01-24 DIAGNOSIS — C50912 Malignant neoplasm of unspecified site of left female breast: Secondary | ICD-10-CM | POA: Insufficient documentation

## 2018-01-24 DIAGNOSIS — G629 Polyneuropathy, unspecified: Secondary | ICD-10-CM | POA: Diagnosis not present

## 2018-01-24 DIAGNOSIS — C7931 Secondary malignant neoplasm of brain: Secondary | ICD-10-CM | POA: Diagnosis not present

## 2018-01-24 DIAGNOSIS — Z886 Allergy status to analgesic agent status: Secondary | ICD-10-CM | POA: Diagnosis not present

## 2018-01-24 DIAGNOSIS — Z79899 Other long term (current) drug therapy: Secondary | ICD-10-CM | POA: Diagnosis not present

## 2018-01-24 DIAGNOSIS — Z1501 Genetic susceptibility to malignant neoplasm of breast: Secondary | ICD-10-CM | POA: Insufficient documentation

## 2018-01-24 NOTE — Addendum Note (Signed)
Encounter addended by: Malena Edman, RN on: 01/24/2018 12:33 PM  Actions taken: Charge Capture section accepted

## 2018-01-24 NOTE — Progress Notes (Addendum)
Radiation Oncology         [phone] ________________________________  Name: Melissa [name] MRN: [id number]  Date: 01/24/2018  DOB: 1965/12/01  Post Treatment Note  CC: [name], MD  [name], [name], *  Diagnosis:   52 y.o. female with  brain metastasis, ER PR positive, HER-2 positive, from invasive ductal carcinoma of the left breast.  Interval Since Last Radiation:  1 month   12/18/2017:   PTV2: 4 mm Rt Parietal lesion treated to 20 Gy in 1 Fx  08/25/2017, 08/28/2017, 08/30/2017, 09/01/2017, 09/04/2017: PTV1: post op SRS to right frontal lobe resection cavity  Narrative:  The patient returns today for routine  1 month follow-up. She tolerated her most recent [place] treatment relatively well and was discharged home in stable condition and without complaints.  In summary, she initially presented to the emergency Department on 07/27/2017 with complaints of headaches ongoing for the past 5 weeks with associated nausea and vomiting. She was initially treated by her PCP for sinusitis without improvement.  She also has a history of migraine headaches and had ben evaluated in the ED at [facility] on 06/20/2017 for treatment of what she thought was a typical migraine headache given the fact that she had her usual blurry vision and aura preceding the headache. She reports that the headache did improve with treatment but the relief was short-lived as the headache returned the very next day. She followed up with her primary care physician and a CT of the head was going to be scheduled due to the persistent headaches but in the interim, she presented to the Emergency Department at [facility] due to increased severity of the headache with associated nausea and vomiting. The headaches were occuring in the frontal lobe as well as bilateral temporal with occasional radiation to the occipital lobe and associated with blurry vision, decreased appetite, fatigue, nausea and vomiting.  She denied  any difficulty with speech, memory, imbalance or focal weakness.  CT Head was performed on admission 07/27/2017 which showed a large right frontal lobe mass measuring 3.1 by 2.5 cm which was felt to potentially represent a primary CNS neoplasm or focus of metastatic disease. There was significant surrounding edema with mass effect and right to left midline shift measuring 8 mm. A subsequent Brain MRI showed a 3.4 x 2.9 x 2.9 cm right frontal lobe mass with imaging characteristics of solitary metastasis and extensive vasogenic edema resulting in 9 mm right to left midline shift as well as equivocal very early left ventricle entrapment.     CT Chest, Abdomen, Pelvis on 07/28/2017 for disease staging showed no findings to suggest metastatic breast cancer involving the chest, abdomen, pelvis or osseous structures. 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 Surgical changes involving the left breast and left axilla but no findings for local recurrence or regional adenopathy.  She proceeded with a gross total resection of the frontal lesion on 08/05/17 with Dr. Cyndy Freeze followed by adjuvant SRS radiotherapy to the resection cavity in February 2019. Final surgical pathology revealed poorly differentiated adenocarcinoma consistent with metastatic breast cancer, ER/PR positive and HER-2 positive.   She tolerated her initial SRS treatment well and initial post treatment MRI brain on 12/07/17 showed satisfactory appearance of the right anterior frontal lobe post treatment site with expected evolution but unfortunately also showed a single, new 2 to 3 mm lesion in the right parietal lobe without associated edema or mass-effect.  She elected to  undergo salvage SRS treatment to the new lesion in the parietal lobe which was completed on 12/18/17 and tolerated well.  On review of systems, the patient states that she is doing well overall.  She is without complaints aside  from mild residual fatigue.  She has noticed improved energy levels and has been able to continue to work.  She denies headaches, decreased visual or auditory acuity, tinnitus, tremor, focal weakness or seizure activity.  She has had 2 episodes of numbness and tingling (first episode was prior to most recent SRS treatment and second episode was 1 week ago) which seems to originate in the left shoulder and radiate to the left fourth and fifth digits, lasting only a few minutes and resolving with spontaneously.  She has also noted intermittent pain in the left elbow particularly after excessive use of the left arm.  She is left-hand dominant.  She has not noted any decreased grip strength on the left as compared to the right.   She is not having difficulty with speech or word finding.  She denies abdominal pain, N/V or diarrhea.  She remains on Letrozole and immunotherapy with Lapatinib and is tolerating this well. She reports a healthy appetite and is steadily gaining her weight back which she is pleased with.  ALLERGIES:  is allergic to aspirin; protonix [pantoprazole]; and iodinated diagnostic agents.  Meds: Current Outpatient Medications  Medication Sig Dispense Refill  . albuterol (VENTOLIN HFA) 108 (90 BASE) MCG/ACT inhaler Inhale 2 puffs into the lungs every 6 (six) hours as needed. 1 Inhaler 3  . budesonide-formoterol (SYMBICORT) 160-4.5 MCG/ACT inhaler Inhale 2 puffs into the lungs daily. 1 Inhaler 11  . lapatinib (TYKERB) 250 MG tablet Take 4 tablets (1,000 mg total) by mouth daily. Take on an empty stomach, 1 hr before or 2 hrs after meals. 150 tablet 3  . letrozole (FEMARA) 2.5 MG tablet Take 1 tablet (2.5 mg total) by mouth daily. 90 tablet 3  . mometasone-formoterol (DULERA) 100-5 MCG/ACT AERO Inhale 1 puff into the lungs daily as needed for shortness of breath.    . naproxen sodium (ALEVE) 220 MG tablet Take 440 mg by mouth 2 (two) times daily as needed (headache).     . predniSONE  (DELTASONE) 50 MG tablet Take one '50mg'$  tablet 13 hrs, 7hrs, and 1 hr prior to CT Scan. (Premedication for dye allergy) 3 tablet 0   No current facility-administered medications for this encounter.     Physical Findings:  height is '5\' 2"'$  (1.575 m) and weight is 111 lb 3.2 oz (50.4 kg). Her oral temperature is 98.3 F (36.8 C). Her blood pressure is 107/78 and her pulse is 85. Her respiration is 20 and oxygen saturation is 100%.  Pain Assessment Pain Score: 0-No pain/10 In general this is a well appearing caucasian female in no acute distress. She's alert and oriented x4 and appropriate throughout the examination. Cardiopulmonary assessment is negative for acute distress and she exhibits normal effort. She appears grossly neurologically intact with intact EOMs bilaterally and PERRLA.  Speech is fluent and appropriate. Sensation is intact to light touch in the upper and lower extremities and strenght is 5/5 and equal bilaterally in upper and lower extremities.   Lab Findings: Lab Results  Component Value Date   WBC 7.4 01/18/2018   HGB 12.0 01/18/2018   HCT 36.3 01/18/2018   MCV 90.8 01/18/2018   PLT 434 (H) 01/18/2018     Radiographic Findings: Ct Chest W Contrast  Result Date:  01/18/2018 CLINICAL DATA:  Metastatic left breast cancer. Craniotomy for tumor resection. Left lumpectomy. Left upper lobectomy in 1997. EXAM: CT CHEST, ABDOMEN, AND PELVIS WITH CONTRAST TECHNIQUE: Multidetector CT imaging of the chest, abdomen and pelvis was performed following the standard protocol during bolus administration of intravenous contrast. CONTRAST:  18m ISOVUE-300 IOPAMIDOL (ISOVUE-300) INJECTION 61% COMPARISON:  Multiple exams, including 07/28/2017 FINDINGS: CT CHEST FINDINGS Cardiovascular: Unremarkable Mediastinum/Nodes: No pathologic adenopathy identified. Prior left axillary dissection. Prior left lumpectomy. Lungs/Pleura: Left upper lobectomy. Centrilobular emphysema. Airway plugging in segmental  airways in the right lower lobe. Left apical scarring. Bandlike densities anteriorly in the left lower lobe unchanged from 07/28/2017 for example on image 61/7, with slight nodular component, a component of bronchocele is not excluded. Peripheral interstitial accentuation anteriorly in the left lung compatible with prior radiation port. Cystic and varicoid regions of bronchiectasis in the left lower lobe with internal air-fluid levels, marginal thickening, and minimal surrounding ground-glass opacity, raising suspicion for local superinfection of bronchiectasis, region of involvement measuring about 7.9 by 1.7 by 2.1 cm. Given the somewhat tubular appearance I do favor bronchiectasis over cavitary lesions. The appearance represents a worsening compared to 07/28/2017. Localized sub solid nodularity in the left lower lobe on image 56/7 measuring about 1.4 cm in diameter, previously about 1.1 cm, inflammatory versus low-grade neoplasm. Musculoskeletal: Mild lower thoracic spondylosis. Stable appearance of high density in the intervertebral disc at T10-11. CT ABDOMEN PELVIS FINDINGS Hepatobiliary: Unremarkable Pancreas: Unremarkable Spleen: Unremarkable Adrenals/Urinary Tract: Unremarkable Stomach/Bowel: Sigmoid diverticulosis. No active diverticulitis identified. Vascular/Lymphatic: Aortoiliac atherosclerotic vascular disease. Reproductive: Unremarkable Other: No supplemental non-categorized findings. Musculoskeletal: Unremarkable IMPRESSION: 1. Cylindrical and varicoid bronchiectasis in the left lower lobe is worsened, and currently demonstrates air-fluid levels and some surrounding inflammatory findings probably indicative of mild local infection. 2. Scattered airway plugging in the lungs. Prior left upper lobectomy. 3. Sub solid nodularity in the left lower lobe measuring 1.4 cm in diameter, previously 1.1 cm, inflammatory versus low-grade lung cancer. Unlikely to be a metastatic breast lesion given the sub solid  appearance. Surveillance is likely warranted. 4. Other imaging findings of potential clinical significance: Aortic Atherosclerosis (ICD10-I70.0) and Emphysema (ICD10-J43.9). Sigmoid diverticulosis. Electronically Signed   By: WVan ClinesM.D.   On: 01/18/2018 16:01   Ct Abdomen Pelvis W Contrast  Result Date: 01/18/2018 CLINICAL DATA:  Metastatic left breast cancer. Craniotomy for tumor resection. Left lumpectomy. Left upper lobectomy in 1997. EXAM: CT CHEST, ABDOMEN, AND PELVIS WITH CONTRAST TECHNIQUE: Multidetector CT imaging of the chest, abdomen and pelvis was performed following the standard protocol during bolus administration of intravenous contrast. CONTRAST:  1070mISOVUE-300 IOPAMIDOL (ISOVUE-300) INJECTION 61% COMPARISON:  Multiple exams, including 07/28/2017 FINDINGS: CT CHEST FINDINGS Cardiovascular: Unremarkable Mediastinum/Nodes: No pathologic adenopathy identified. Prior left axillary dissection. Prior left lumpectomy. Lungs/Pleura: Left upper lobectomy. Centrilobular emphysema. Airway plugging in segmental airways in the right lower lobe. Left apical scarring. Bandlike densities anteriorly in the left lower lobe unchanged from 07/28/2017 for example on image 61/7, with slight nodular component, a component of bronchocele is not excluded. Peripheral interstitial accentuation anteriorly in the left lung compatible with prior radiation port. Cystic and varicoid regions of bronchiectasis in the left lower lobe with internal air-fluid levels, marginal thickening, and minimal surrounding ground-glass opacity, raising suspicion for local superinfection of bronchiectasis, region of involvement measuring about 7.9 by 1.7 by 2.1 cm. Given the somewhat tubular appearance I do favor bronchiectasis over cavitary lesions. The appearance represents a worsening compared to 07/28/2017. Localized sub solid nodularity  in the left lower lobe on image 56/7 measuring about 1.4 cm in diameter, previously about  1.1 cm, inflammatory versus low-grade neoplasm. Musculoskeletal: Mild lower thoracic spondylosis. Stable appearance of high density in the intervertebral disc at T10-11. CT ABDOMEN PELVIS FINDINGS Hepatobiliary: Unremarkable Pancreas: Unremarkable Spleen: Unremarkable Adrenals/Urinary Tract: Unremarkable Stomach/Bowel: Sigmoid diverticulosis. No active diverticulitis identified. Vascular/Lymphatic: Aortoiliac atherosclerotic vascular disease. Reproductive: Unremarkable Other: No supplemental non-categorized findings. Musculoskeletal: Unremarkable IMPRESSION: 1. Cylindrical and varicoid bronchiectasis in the left lower lobe is worsened, and currently demonstrates air-fluid levels and some surrounding inflammatory findings probably indicative of mild local infection. 2. Scattered airway plugging in the lungs. Prior left upper lobectomy. 3. Sub solid nodularity in the left lower lobe measuring 1.4 cm in diameter, previously 1.1 cm, inflammatory versus low-grade lung cancer. Unlikely to be a metastatic breast lesion given the sub solid appearance. Surveillance is likely warranted. 4. Other imaging findings of potential clinical significance: Aortic Atherosclerosis (ICD10-I70.0) and Emphysema (ICD10-J43.9). Sigmoid diverticulosis. Electronically Signed   By: Van Clines M.D.   On: 01/18/2018 16:01    Impression/Plan: 1. 52 y.o. female with  brain metastasis, ER PR positive, HER-2 positive, from invasive ductal carcinoma of the left breast. She appears to have recovered well from her recent [facility] treatment.  She is currently without complaints.  We will plan to repeat an MRI of the brain in 2 months time to assess treatment response and monitor for any disease progression.  Her case and imaging will be reviewed at the multidisciplinary brain conference prior to a follow-up visit in our office to review results and recommendations.  She knows to call immediately for any new or progressive neurologic symptoms or  questions or concerns related to her previous radiotherapy.  She appears to have a good understanding of her disease and our recommendations and is comfortable and in agreement with the current plan as stated. 2. Left upper extremity neuropathy.  She is having Intermittent episodes of numbness/tingling in the LUE as well as pain in the left elbow which resolves spontaneously and is not currently interfering with her daily life.  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  She knows to call if symptoms progress in the interim and we will consider referral to ortho or neurologist for further evaluation at that time.      Nicholos Johns, PA-C

## 2018-02-12 ENCOUNTER — Telehealth: Payer: Self-pay | Admitting: Radiation Oncology

## 2018-02-12 ENCOUNTER — Other Ambulatory Visit: Payer: Self-pay | Admitting: Radiation Therapy

## 2018-02-12 DIAGNOSIS — C7931 Secondary malignant neoplasm of brain: Secondary | ICD-10-CM

## 2018-02-12 DIAGNOSIS — C7949 Secondary malignant neoplasm of other parts of nervous system: Principal | ICD-10-CM

## 2018-02-12 MED ORDER — DEXAMETHASONE 2 MG PO TABS: 2.0000 mg | ORAL_TABLET | Freq: Two times a day (BID) | ORAL | 0 refills | Status: DC

## 2018-02-12 MED FILL — TYKERB 250 MG TABLET: 250 | 30 days supply | Qty: 120 | Fill #4

## 2018-02-12 NOTE — Telephone Encounter (Signed)
Left voicemail with new script for decadron 2 mg BID x 7 days then, 2 mg daily x 7 days then, STOP.

## 2018-02-12 NOTE — Telephone Encounter (Signed)
-----   Message from Tyler Pita, MD sent at 02/12/2018 12:54 PM EDT ----- Regarding: RE: Rt sided headache with lightheadedness x 2 weeks  Thanks agree with moving up scan.  ----- Message ----- From: Hayden Pedro, PA-C Sent: 02/12/2018  12:12 PM To: Tyler Pita, MD, Pincus Large, # Subject: FW: Rt sided headache with lightheadedness x#  I opened up her chart and it looks like it's not documented the size of PTV2, but the notes say 2-3 mm. Can you have them confirm size in physics and add it into the prescription notes in [place]? I agree with pushing up her scan, but it's not likely that something would grow that quickly. 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 We can start steroids 2 mg BID for 1 week, then 2 mg daily for second week then stop.   ----- Message ----- From: Pincus Large Sent: 02/12/2018  11:05 AM To: Tyler Pita, MD, # Subject: Rt sided headache with lightheadedness x 2 w#  Melissa [name] called complaining of an intermittent Rt sided headache with the feeling of light headedness all day. This has been going on for about 2 weeks now.   She has had 2 courses of SRS, and is concerned that she should be scanned sooner than the 46mo mark, due in September.    Course #1- Post OP cavity  08/25/2017, 08/28/2017, 08/30/2017, 09/01/2017, 09/04/2017 PTV1 Rt Frontal 24mm 25 Gy/5Fx  Course #2   12/18/2017:PTV2 Rt Parietaltreated to20 Gyin 1 Fx  Please advise, [name]

## 2018-02-12 NOTE — Telephone Encounter (Signed)
Phoned patient. Explained Decadron 2 mg was called to [facility], [place]. Reviewed directions for use of the medication and potential side effects. Patient verbalized understanding of all reviewed.

## 2018-02-16 ENCOUNTER — Ambulatory Visit
Admission: RE | Admit: 2018-02-16 | Discharge: 2018-02-16 | Disposition: A | Discharge status: Home / Self Care | Payer: 59 | Source: Ambulatory Visit | Attending: Radiation Oncology | Admitting: Radiation Oncology

## 2018-02-16 ENCOUNTER — Other Ambulatory Visit: Payer: Self-pay | Admitting: Radiation Therapy

## 2018-02-16 DIAGNOSIS — C7949 Secondary malignant neoplasm of other parts of nervous system: Principal | ICD-10-CM

## 2018-02-16 DIAGNOSIS — C7931 Secondary malignant neoplasm of brain: Secondary | ICD-10-CM

## 2018-02-16 MED ORDER — GADOBENATE DIMEGLUMINE 529 MG/ML IV SOLN
10.0000 mL | Freq: Once | INTRAVENOUS | Status: AC | PRN
  Administered 2018-02-16: 10 mL via INTRAVENOUS

## 2018-02-16 NOTE — Addendum Note (Signed)
Encounter addended by: Freeman Caldron, PA-C on: 02/16/2018 11:56 AM  Actions taken: Sign clinical note

## 2018-02-16 NOTE — Progress Notes (Signed)
Melissa [name] 52 yo woman with a 2-3 mm right parietal brain metastasis from cancer of the upper inner left breast completed 12-18-17 MRI brain w wo contrast 02-16-18 FU.   Headache:No Pain:No Nausea/Vomiting:No Visional changes: No Ringing in ears:Yes left ear past two days Fatigue:Yes Cognitive changes:No, alert and oriented x 3. Wt Readings from Last 3 Encounters:  02/19/18 110 lb 3.2 oz (50 kg)  01/24/18 111 lb 3.2 oz (50.4 kg)  01/22/18 111 lb (50.3 kg)  BP 106/70 (BP Location: Right Arm, Patient Position: Sitting)   Pulse 78   Temp 98.6 F (37 C) (Oral)   Resp 16   Ht 5\' 2"  (1.575 m)   Wt 110 lb 3.2 oz (50 kg)   LMP 07/25/2012 Comment: pregnancy waiver form signed 01-18-2018  SpO2 98%   BMI 20.16 kg/m

## 2018-02-17 NOTE — Progress Notes (Signed)
Please call patient with normal result.  Thanks. MM 

## 2018-02-19 ENCOUNTER — Ambulatory Visit
Admission: RE | Admit: 2018-02-19 | Discharge: 2018-02-19 | Disposition: A | Discharge status: Home / Self Care | Payer: 59 | Source: Ambulatory Visit | Attending: Radiation Oncology | Admitting: Radiation Oncology

## 2018-02-19 ENCOUNTER — Other Ambulatory Visit: Payer: Self-pay

## 2018-02-19 ENCOUNTER — Inpatient Hospital Stay: Payer: 59 | Attending: Hematology and Oncology

## 2018-02-19 VITALS — BP 106/70 | HR 78 | Temp 98.6°F | Resp 16 | Ht 62.0 in | Wt 110.2 lb

## 2018-02-19 DIAGNOSIS — Z1501 Genetic susceptibility to malignant neoplasm of breast: Secondary | ICD-10-CM | POA: Diagnosis not present

## 2018-02-19 DIAGNOSIS — Z923 Personal history of irradiation: Secondary | ICD-10-CM | POA: Insufficient documentation

## 2018-02-19 DIAGNOSIS — Z9221 Personal history of antineoplastic chemotherapy: Secondary | ICD-10-CM | POA: Insufficient documentation

## 2018-02-19 DIAGNOSIS — Z79899 Other long term (current) drug therapy: Secondary | ICD-10-CM | POA: Insufficient documentation

## 2018-02-19 DIAGNOSIS — J449 Chronic obstructive pulmonary disease, unspecified: Secondary | ICD-10-CM | POA: Diagnosis not present

## 2018-02-19 DIAGNOSIS — Z91041 Radiographic dye allergy status: Secondary | ICD-10-CM | POA: Insufficient documentation

## 2018-02-19 DIAGNOSIS — C50912 Malignant neoplasm of unspecified site of left female breast: Secondary | ICD-10-CM | POA: Diagnosis not present

## 2018-02-19 DIAGNOSIS — Z886 Allergy status to analgesic agent status: Secondary | ICD-10-CM | POA: Diagnosis not present

## 2018-02-19 DIAGNOSIS — F17210 Nicotine dependence, cigarettes, uncomplicated: Secondary | ICD-10-CM | POA: Insufficient documentation

## 2018-02-19 DIAGNOSIS — C7931 Secondary malignant neoplasm of brain: Secondary | ICD-10-CM | POA: Diagnosis present

## 2018-02-19 NOTE — Progress Notes (Signed)
Radiation Oncology         [phone] ________________________________  Name: Melissa [name] MRN: [id number]  Date of Service: 02/19/2018 DOB: May 21, 1966  Follow Up Note  CC: [name], MD    Diagnosis: 52 y.o. female with  brain metastasis, ER PR positive, HER-2 positive, from invasive ductal carcinoma of the left breast.  Interval Since Last Radiation:  9 weeks   12/18/2017 SRS Treatment: PTV2: 4 mm Rt Parietal lesiontreated to20 Gyin 1 Fx  08/25/2017- 09/04/2017 Postop SRS Treatment:  PTV1: post op SRS to right frontal lobe resection cavity over 5 fractions  Narrative:  The patient returns today for follow-up after recent MRI scan.  In summary the patient is a pleasant 52 y.o. woman who was diagnosed with triple positive breast cancer in 2013, she received neoadjuvant chemo followed by surgery and radiation followed by antiestrogen therapy.  She was found to have recurrence in January 2019 with a 3.4 cm right frontal lobe mass.  She underwent surgical resection of this site followed by postoperative SRS.  She was found in June of this year to have a new lesion and subsequently underwent SRS.  In the last 3 weeks she started noticing a shooting pain at the upper portion of the scalp and contacted our office with concerns with her symptoms.  It was decided to begin a short course of pulsed steroids with taper instructions followed by MRI of the brain.  She did undergo her MRI on 02/16/2018 that did not reveal any evidence of edema, evidence of radionecrosis or new tumors.  Previously treated right parietal lesion has resolved, and stability of enhancement along the right frontal lobe at the site of her previous resection was noted.                 On review of systems, the patient states she is doing very well.  She states that she begins her next step of her taper tomorrow.  She has been doing better and reports resolution of her headaches.  She denies any nausea or aura.   She denies a history of prior migraines or headaches before her diagnosis of disease in the brain back at the beginning of the year.  She denies any visual or auditory disturbances.  She does notice a little area of thickening along her scalp.  No other complaints are verbalized.  Past Medical History:  Past Medical History:  Diagnosis Date  . Anemia   . Arthritis    knees and hips  . Asthma   . Breast cancer ([place])   . Bronchiolitis   . Cancer [facility])    breast cancer 2014  . Complication of anesthesia    bp dropped + desat   . COPD (chronic obstructive pulmonary disease) ([place])   . GERD (gastroesophageal reflux disease)   . H/O coccidioidomycosis    was reason for lung lobectomy  . Headache(784.0)    due to eye strain or not eating  . History of anemia    no current problem  . History of asthma    as a child  . History of breast cancer 2014   left  . History of chemotherapy    finished 07/17/2013  . History of hiatal hernia   . Hx of radiation therapy 03/25/13-05/06/13   left breast 5000 cGy/25 sessions, left breast boost 1000 cGy/5 sessions  . Runny nose 07/30/2013   clear drainage  . Wears dentures    upper    Past Surgical  History: Past Surgical History:  Procedure Laterality Date  . APPLICATION OF CRANIAL NAVIGATION N/A 08/04/2017   Procedure: APPLICATION OF CRANIAL NAVIGATION;  Surgeon: [name], [name], MD;  Location: [place];  Service: Neurosurgery;  Laterality: N/A;  . AXILLARY LYMPH NODE DISSECTION Left 02/08/2013   Procedure: LEFT AXILLARY DISSECTION;  Surgeon: Edward Jolly, MD;  Location: [place];  Service: General;  Laterality: Left;  . BREAST CYST EXCISION Right 2006  . BREAST LUMPECTOMY Left 2014  . BREAST LUMPECTOMY WITH NEEDLE LOCALIZATION AND AXILLARY SENTINEL LYMPH NODE BX Left 12/31/2012   Procedure: NEEDLE LOCALIZATION LEFT BREAST LUMPECTOMY AND LEFT AXILLARY SENTENIAL LYMPH NODE BX;  Surgeon: Edward Jolly, MD;  Location:  [place];  Service: General;  Laterality: Left;  . CESAREAN SECTION  1995/1996  . CRANIOTOMY Right 08/04/2017   Procedure: Right Frontal craniotomy for resection of tumor with stereotactic navigation;  Surgeon: [name], [name], MD;  Location: [place];  Service: Neurosurgery;  Laterality: Right;  Right Frontal craniotomy for resection of tumor with stereotactic navigation  . LUNG LOBECTOMY Left 05/1996   upper lobe - due to [facility] Fever  . PORT-A-CATH REMOVAL Right 08/02/2013   Procedure: REMOVAL PORT-A-CATH;  Surgeon: Edward Jolly, MD;  Location: [place];  Service: General;  Laterality: Right;  . PORTACATH PLACEMENT  07/02/2012   Procedure: INSERTION PORT-A-CATH;  Surgeon: Edward Jolly, MD;  Location: [place];  Service: General;  Laterality: N/A;  right    Social History:  Social History   Socioeconomic History  . Marital status: Married    Spouse name: Not on file  . Number of children: 2  . Years of education: Not on file  . Highest education level: Not on file  Occupational History  . Occupation: Secondary school teacher  Social Needs  . Financial resource strain: Not on file  . Food insecurity:    Worry: Not on file    Inability: Not on file  . Transportation needs:    Medical: Not on file    Non-medical: Not on file  Tobacco Use  . Smoking status: Current Every Day Smoker    Packs/day: 0.50    Years: 24.00    Pack years: 12.00    Types: Cigarettes  . Smokeless tobacco: Never Used  Substance and Sexual Activity  . Alcohol use: No  . Drug use: No  . Sexual activity: Yes    Comment: trying to quit  Lifestyle  . Physical activity:    Days per week: Not on file    Minutes per session: Not on file  . Stress: Not on file  Relationships  . Social connections:    Talks on phone: Not on file    Gets together: Not on file    Attends religious service: Not on file    Active member of club or organization: Not  on file    Attends meetings of clubs or organizations: Not on file    Relationship status: Not on file  . Intimate partner violence:    Fear of current or ex partner: Not on file    Emotionally abused: Not on file    Physically abused: Not on file    Forced sexual activity: Not on file  Other Topics Concern  . Not on file  Social History Narrative  . 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  She enjoys cooking.  Family History: Family  History  Problem Relation Age of Onset  . Emphysema Mother        was a smoker  . Heart disease Mother   . Melanoma Mother        dx in her 56s  . Breast cancer Mother 71  . Asthma Brother   . Breast cancer Cousin        mother's maternal cousin; dx in her 7s    ALLERGIES:  is allergic to aspirin; protonix [pantoprazole]; and iodinated diagnostic agents.  Meds: Current Outpatient Medications  Medication Sig Dispense Refill  . albuterol (VENTOLIN HFA) 108 (90 BASE) MCG/ACT inhaler Inhale 2 puffs into the lungs every 6 (six) hours as needed. 1 Inhaler 3  . budesonide-formoterol (SYMBICORT) 160-4.5 MCG/ACT inhaler Inhale 2 puffs into the lungs daily. 1 Inhaler 11  . dexamethasone (DECADRON) 2 MG tablet Take 1 tablet (2 mg total) by mouth 2 (two) times daily with a meal. Take one tablet (2 mg) twice a day for seven days then, one tablet (2 mg) once a day for seven days then, STOP. 30 tablet 0  . letrozole (FEMARA) 2.5 MG tablet Take 1 tablet (2.5 mg total) by mouth daily. 90 tablet 3  . mometasone-formoterol (DULERA) 100-5 MCG/ACT AERO Inhale 1 puff into the lungs daily as needed for shortness of breath.    . naproxen sodium (ALEVE) 220 MG tablet Take 440 mg by mouth 2 (two) times daily as needed (headache).     . lapatinib (TYKERB) 250 MG tablet Take 4 tablets (1,000 mg total) by mouth daily. Take on an empty stomach, 1 hr before or 2 hrs after meals. 150 tablet 3   No current facility-administered medications for  this encounter.     Physical Findings:  height is _0  (1.575 m) and weight is 110 lb 3.2 oz (50 kg). Her oral temperature is 98.6 F (37 C). Her blood pressure is 106/70 and her pulse is 78. Her respiration is 16 and oxygen saturation is 98%.  Pain Assessment Pain Score: 0-No pain/10 In general this is a well appearing Caucasian female in no acute distress.  She's alert and oriented x4 and appropriate throughout the examination. Cardiopulmonary assessment is negative for acute distress and she exhibits normal effort.  She is normocephalic atraumatic EOMs are intact.  She does have a small depression along the midline of her frontal bone.  This is consistent with her prior craniotomy site.  No evidence of meningocele was appreciated, no evidence of dehiscence is present.  Lab Findings: Lab Results  Component Value Date   WBC 7.4 01/18/2018   HGB 12.0 01/18/2018   HCT 36.3 01/18/2018   MCV 90.8 01/18/2018   PLT 434 (H) 01/18/2018     Radiographic Findings: Mr Jeri Cos XT Contrast  Result Date: 02/16/2018 CLINICAL DATA:  Right frontal breast cancer metastasis status post resection and postoperative SRS. Subsequent right parietal metastasis treated with [facility] June 2019. Restaging EXAM: MRI HEAD WITHOUT AND WITH CONTRAST TECHNIQUE: Multiplanar, multiecho pulse sequences of the brain and surrounding structures were obtained without and with intravenous contrast. CONTRAST:  24m MULTIHANCE GADOBENATE DIMEGLUMINE 529 MG/ML IV SOLN COMPARISON:  12/07/2017 FINDINGS: BRAIN New Lesions: None. Larger lesions: None. Stable or Smaller lesions: 15 mm enhancing lesion located anterior right frontal lobe and seen on 10:108. The right parietal lesion previously seen and intervally treated is no longer visible. Other Brain findings: Stable patchy FLAIR hyperintensity in the cerebral white matter and around the treated right frontal lesion. No  infarct, hemorrhage, hydrocephalus, or collection. Stable dural  thickening below the right frontal craniotomy considered postoperative Vascular: Major flow voids and vascular enhancements are preserved Skull and upper cervical spine: Unremarkable craniotomy. No evident marrow lesion Sinuses/Orbits: Negative IMPRESSION: 1. Interval treatment of right parietal metastasis which is no longer seen. 2. Stable satisfactory appearance of treated right frontal metastasis. 3. No new metastasis. Electronically Signed   By: Monte Fantasia M.D.   On: 02/16/2018 16:00    Impression/Plan: 1. 52 y.o. female with  brain metastasis, ER PR positive, HER-2 positive, from invasive ductal carcinoma of the left breast. The patient appears to be doing pretty well since starting her steroids, we reviewed the reassuring findings from her brain scan, and we will plan to repeat her MRI scan in 3 months.  She is in agreement with this plan.  In the interim, she will let us know if she has any questions or concerns, and will notify Dr. Vertell Limber if she has concerns as well about her mental incision site.    Carola Rhine, PAC

## 2018-02-20 ENCOUNTER — Encounter: Payer: Self-pay | Admitting: Radiation Oncology

## 2018-02-20 NOTE — Progress Notes (Signed)
Brain and Spine Tumor Board Documentation  Melissa [name] was presented by Cecil Cobbs, MD at Brain and Spine Tumor Board on 02/20/2018, which included representatives from medical oncology, neuro oncology, radiation oncology, surgical oncology, radiology, pathology, navigation, genetics.  Melissa [name] was presented as a current patient with history of the following treatments: surgical intervention(s), adjuvant radiation, adjuvant chemotherapy.  Additionally, we reviewed previous medical and familial history, history of present illness, and recent lab results along with all available histopathologic and imaging studies. The tumor board considered available treatment options and made the following recommendations:  active surveillance MRI brain in 3 months  Tumor board is a meeting of clinicians from various specialty areas who evaluate and discuss patients for whom a multidisciplinary approach is being considered. Final determinations in the plan of care are those of the provider(s). The responsibility for follow up of recommendations given during tumor board is that of the provider.   Today's extended care, comprehensive team conference, Melissa [name] was not present for the discussion and was not examined.

## 2018-02-28 ENCOUNTER — Encounter: Payer: Self-pay | Admitting: Internal Medicine

## 2018-02-28 ENCOUNTER — Ambulatory Visit: Payer: 59 | Admitting: Internal Medicine

## 2018-02-28 ENCOUNTER — Ambulatory Visit (INDEPENDENT_AMBULATORY_CARE_PROVIDER_SITE_OTHER)
Admission: RE | Admit: 2018-02-28 | Discharge: 2018-02-28 | Disposition: A | Discharge status: Home / Self Care | Payer: 59 | Source: Ambulatory Visit | Attending: Internal Medicine | Admitting: Internal Medicine

## 2018-02-28 VITALS — BP 106/64 | HR 117 | Temp 99.5°F | Ht 62.0 in | Wt 110.2 lb

## 2018-02-28 DIAGNOSIS — J479 Bronchiectasis, uncomplicated: Secondary | ICD-10-CM

## 2018-02-28 DIAGNOSIS — J181 Lobar pneumonia, unspecified organism: Secondary | ICD-10-CM | POA: Diagnosis not present

## 2018-02-28 DIAGNOSIS — F17210 Nicotine dependence, cigarettes, uncomplicated: Secondary | ICD-10-CM

## 2018-02-28 DIAGNOSIS — J189 Pneumonia, unspecified organism: Secondary | ICD-10-CM

## 2018-02-28 MED ORDER — ACETAMINOPHEN-CODEINE #3 300-30 MG PO TABS
1.0000 | ORAL_TABLET | ORAL | 0 refills | Status: DC | PRN
Start: 2018-02-28 — End: 2018-05-02

## 2018-02-28 MED ORDER — AMOXICILLIN-POT CLAVULANATE 875-125 MG PO TABS: 1.0000 | ORAL_TABLET | Freq: Two times a day (BID) | ORAL | 0 refills | Status: AC

## 2018-02-28 MED ORDER — PREDNISONE 10 MG PO TABS: ORAL_TABLET | ORAL | 0 refills | Status: DC

## 2018-02-28 NOTE — Patient Instructions (Addendum)
Ok to increase the symbicort 160 to Take 2 puffs first thing in am and then another 2 puffs about 12 hours later.   Work on inhaler technique:  relax and gently blow all the way out then take a nice smooth deep breath back in, triggering the inhaler at same time you start breathing in.  Hold for up to 5 seconds if you can. Blow out thru nose. Rinse and gargle with water when done      Augmentin 875 mg take one pill twice daily  X 10 days - take at breakfast and supper with large glass of water.  It would help reduce the usual side effects (diarrhea and yeast infections) if you ate cultured yogurt at lunch.     Prednisone 10 mg take  4 each am x 2 days,   2 each am x 2 days,  1 each am x 2 days and stop   For cough > tylenol # 3 one every 4 and flutter valve and mucinex 1200 mg every 12 hours as needed    The key is to stop smoking completely before smoking completely stops you!    .Please remember to go to the  x-ray department downstairs in the basement  for your tests - we will call you with the results when they are available. - needs alpha one screen on return

## 2018-02-28 NOTE — Progress Notes (Signed)
Subjective:     Patient ID: Melissa [name], female   DOB: 1966-05-01 .   MRN: [id number]    Brief patient profile:  34  yowf active smoker dx'd with [place] in [place] requiring [place] in 1997 and 3 years of antifungal rx with fluconazole with residual sob heavy doe and sev times a year pain in L shoulder and lower back up to couple times a year lasts up  to a couple  of weeks maybe better with abx, maybe some worse joint pains,  Sometimes some sweats, occ with nasty mucus but no blood referred to pulmonary clinic for recurrent cp 01/2011 with documented Bronchiectasis in 2012    History of Present Illness  01/26/2011 Initial pulmonary office eval cc recurrent pleuritic L ant chest  pain, more severe than usual episodes,  rad neck,  Onset x 2 weeks with abrupt onset,  no better on lodine> stopped.   No sweats or nasty mucus now. Some better at day of ov p rx = dex and zithromax but  some sorethroat since dex with overt hb. rec ct chest POS BRONCHIECTASIS    Nov 2013 Breast Ca > lumpectomy with pos node> RT and chemo Finished Jan 2015     07/15/2016 acute extended ov/Vannie Hilgert re: ? Bronchiectasis /dulera 100 2 bid maint / still smoking Chief Complaint  Patient presents with  . Acute Visit    lower right back pain x 1 wk- radiating to her right rib area over the past 2 days. She feels discomfort when she takes a deep breath.  She also c/o episodes of feeling light headed. She is not having any SOB and no more cough than usual.    acute onset one week prior to OV  Very Similar pattern/characteristics to prev flares of bronchiectasis related cp on L side but this CP is located R post rad anteriorly / worse with breathing/ worse supine/ moderately severe No sob or need for rescue saba / some better with tylenol  rec levaquin 750 mg one daily x 5 days  For cough / congestion > mucinex dm up to 1200 every 12 hours as needed  Plan A = Automatic = Symbicort 80 1-2 every 12 hours  Work on  maintaining perfect  inhaler technique:  Plan B = Backup Only use your albuterol as a rescue medication CTa  07/19/16 neg PE or explanation for R cp c/w mscp from cough     11/25/2016  f/u ov/Malicia Blasdel re: f/u apparent pna LLL in pt with known bronchiectasis/ cystic lesion LLL  Chief Complaint  Patient presents with  . Follow-up    Pt states dxed with PNA x 3 wks ago.  She states she was told a few days ago that her CXR showed a lung mass and told her that she needed a CT Chest. She states having right side pain and minimal SOB.    cough and cp improved from last ov/ no excess/ purulent sputum or mucus plugs then mid April 2018 abrupt onset  nasal stuffiness and more chest congestion more yellowish then green/chills >  seen in [place]  With dx "L sided pna" rx x levaquin x 10 days > green mucus gone /chills gone >  Back to baseline level of chronic mscp R > L worse  Only with cough and back to nl ex tol   rec The key is to stop smoking completely before smoking completely stops you!      02/27/2017  f/u ov/Mingo Siegert re:  Bronchiectasis with nl pfts on sybm 80 2bid / still smoking  Chief Complaint  Patient presents with  . Follow-up    Breathing is doing well. No new co's today.   cps all gone and Not limited by breathing from desired activities   No need for saba  rec Stop smoking    06/16/2017  f/u ov/Adel Neyer re:  Bronchiectasis s/p valley fever/ symb 80 2bid  Chief Complaint  Patient presents with  . Follow-up    Breathing is overall doing well. She states she has been having some HA and was dxed with sinus infection a few days ago- taking levaquin.    Ha  frontal and bilateral and worse in am and better on levquin/medrol  rec  Call if not better after you finish your antibiotics The key is to stop smoking completely before smoking completely stops you!   Brain surgery Jan 2019 then RT Feb 2019 finished taking dex around late Feb    11/22/2017 acute extended ov/Vaida Kerchner re:  Bronchiectasis /  symb 45 2bid  Chief Complaint  Patient presents with  . Acute Visit    Having trouble breathing productive cough mucus yellow in color. sharp pain in back.  around early April 2019  nasal congestion / cough slt yellow thick mucus  rx sudaphed seemed some better Then abruptly 2 d prior to [place] chills/ L post cp similar location as prior episodes of bronchiectasis and more sob with activity but not at rest though feels can't take a deep comfortable breath -   Cp better aleve and heating pad. Having to use"rescue" again sev times day and occ noct  (confused between dulera and saba though)  rec Plan A = Automatic = Symbicort or dulera  One twice daily of either one  Work on inhaler technique:  Plan B = Backup Only use your albuterol (ventolin) as a rescue medication Augmentin 875 mg take one pill twice daily  X 10 days -  Please remember to go to the  x-ray department downstairs in the basement  for your tests - we will call you with the results when they are available.    02/28/2018 acute extended ov/Warwick Nick re: cough/ still actively smoking  Chief Complaint  Patient presents with  . Acute Visit    c/o chest congestion, sinus congestion, sob, headaches Xapprox 1 week. Was dx'ed by UC with PNA, not improving on abx.   doing Fine p last ov then abruptly worse  02/22/18 with st, stuffy head and chest with clear mucus, aching all ever, chill temp 100 and increased sob/ need for saba but only once or twice a week Rx with levaquin 02/22/18 but no better and has 2 days left and using saba up to 4 x daily but neb only once, helped some. Doe = MMRC3 = can't walk 100 yards even at a slow pace at a flat grade s stopping due to sob  - severe cough disrupting sleep and wants that yellow cough med that has codeine (? Tussionex/)    Has same somewhat pleuritic discomfort L post chest she always gets with bronchiectasis flares, no better with levaquin   No obvious day to day or daytime variability or assoc    purulent sputum or mucus plugs or hemoptysis or  chest tightness, subjective wheeze or overt   hb symptoms.    Also denies any obvious fluctuation of symptoms with weather or environmental changes or other aggravating or alleviating factors except as outlined above  No unusual exposure hx or h/o childhood pna/ asthma or knowledge of premature birth.  Current Allergies, Complete Past Medical History, Past Surgical History, Family History, and Social History were reviewed in [employer] record.  ROS  The following are not active complaints unless bolded Hoarseness, sore throat, dysphagia, dental problems, itching, sneezing,  nasal congestion or discharge of excess mucus or purulent secretions, ear ache,   fever, chills, sweats, unintended wt loss or wt gain, classically   exertional cp,  orthopnea pnd or arm/hand swelling  or leg swelling, presyncope, palpitations, abdominal pain, anorexia, nausea, vomiting, diarrhea  or change in bowel habits or change in bladder habits, change in stools or change in urine, dysuria, hematuria,  rash, arthralgias/myalgias, visual complaints, headache, numbness, weakness or ataxia or problems with walking or coordination,  change in mood or  memory.        Current Meds  Medication Sig  . albuterol (VENTOLIN HFA) 108 (90 BASE) MCG/ACT inhaler Inhale 2 puffs into the lungs every 6 (six) hours as needed.  . budesonide-formoterol (SYMBICORT) 160-4.5 MCG/ACT inhaler Inhale 2 puffs into the lungs daily.  . lapatinib (TYKERB) 250 MG tablet Take 4 tablets (1,000 mg total) by mouth daily. Take on an empty stomach, 1 hr before or 2 hrs after meals.  Marland Kitchen letrozole (FEMARA) 2.5 MG tablet Take 1 tablet (2.5 mg total) by mouth daily.  Marland Kitchen levofloxacin (LEVAQUIN) 500 MG tablet Take 500 mg by mouth daily.  . mometasone-formoterol (DULERA) 100-5 MCG/ACT AERO Inhale 1 puff into the lungs daily as needed for shortness of breath.  . naproxen sodium (ALEVE) 220 MG  tablet Take 440 mg by mouth 2 (two) times daily as needed (headache).                                Objective:   Physical Exam  amb wf nad   Wt 118  02/10/11 >  05/03/2011  118 > 117 03/23/2012 >  122 07/30/2012 > 09/29/2014 121> 06/13/2016   119 > 07/15/2016 118 > 08/09/2016  118  > 11/25/2016   114 > 02/27/2017   116 > 06/16/2017  119 >  11/22/2017 112 >  02/28/2018 110    Vital signs reviewed - Note on arrival 02 sats  96% on RA          HEENT: nl dentition, turbinates bilaterally, and oropharynx. Nl external ear canals without cough reflex   NECK :  without JVD/Nodes/TM/ nl carotid upstrokes bilaterally   LUNGS: no acc muscle use,  Nl contour chest with very coarse insp/exp rhonchi L>R with minimal wheeze/ cough on exp    CV:  RRR  no s3 or murmur or increase in P2, and no edema   ABD:  soft and nontender with nl inspiratory excursion in the supine position. No bruits or organomegaly appreciated, bowel sounds nl  MS:  Nl gait/ ext warm without deformities, calf tenderness, cyanosis or clubbing No obvious joint restrictions   SKIN: warm and dry without lesions    NEURO:  alert, approp, nl sensorium with  no motor or cerebellar deficits apparent.         CXR PA and Lateral:   02/28/2018 :    I personally reviewed images and agree with radiology impression as follows:   Suspect increase in pneumonia in the left upper lobe. Postoperative changes are noted on the left with volume loss in the left upper  lobe as well as scarring.  Apparent varicoid bronchiectasis left lower lobe, better demonstrated on prior CT. No   cavitation is seen in this area. The appearance in this area is similar to the most recent CT.  Right lung is clear. Stable cardiac silhouette. No adenopathy is demonstrable by radiography.  Patient is status post left mastectomy with surgical clips in left axillary region.            Assessment:

## 2018-03-01 ENCOUNTER — Telehealth: Payer: Self-pay | Admitting: Internal Medicine

## 2018-03-01 ENCOUNTER — Encounter: Payer: Self-pay | Admitting: Internal Medicine

## 2018-03-01 ENCOUNTER — Other Ambulatory Visit: Payer: Self-pay | Admitting: Internal Medicine

## 2018-03-01 DIAGNOSIS — J189 Pneumonia, unspecified organism: Secondary | ICD-10-CM

## 2018-03-01 DIAGNOSIS — J181 Lobar pneumonia, unspecified organism: Principal | ICD-10-CM

## 2018-03-01 NOTE — Telephone Encounter (Signed)
Patient called for CXR results.  Results given and understanding stated.  2 week OV scheduled for 03/15/18 at 0930 with Dr. Melvyn Novas.  CXR order placed.  Patient is aware to arrive early for CXR before OV.  Nothing further at this time.

## 2018-03-01 NOTE — Assessment & Plan Note (Signed)
-   CT chest   01/26/2011 Status post left upper lobectomy.  Focal bronchiectasis / scarring medially in the left lung apex with  surrounding inflammatory changes  - Pneumovax 08/12/11  And prevnar 08/09/2016  - 07/15/2016  After extensive coaching HFA effectiveness =    75% symbicort 80 2bid  - PFT's  08/09/2016  FEV1 2.05 (77 % ) ratio 70  p 5 % improvement from saba p dulera 100 x2  prior to study with DLCO  46 % corrects to 55  % for alv volume    DDX of  difficult airways management almost all start with A and  include Adherence, Ace Inhibitors, Acid Reflux, Active Sinus Disease, Alpha 1 Antitripsin deficiency, Anxiety masquerading as Airways dz,  ABPA,  Allergy(esp in young), Aspiration (esp in elderly), Adverse effects of meds,  Active smokers, A bunch of PE's (a small clot burden can't cause this syndrome unless there is already severe underlying pulm or vascular dz with poor reserve) plus two Bs  = Bronchiectasis and Beta blocker use..and one C= CHF  Adherence is always the initial "prime suspect" and is a multilayered concern that requires a "trust but verify" approach in every patient - starting with knowing how to use medications, especially inhalers, correctly, keeping up with refills and understanding the fundamental difference between maintenance and prns vs those medications only taken for a very short course and then stopped and not refilled.  - - The proper method of use, as well as anticipated side effects, of a metered-dose inhaler are discussed and demonstrated to the patient.   ? Allergy/asthmatic component:  Prednisone 10 mg take  4 each am x 2 days,   2 each am x 2 days,  1 each am x 2 days and stop and increase symb to 160 2bid when symptoms flare  Active smoking at top of the usual list of suspects > (see separate a/p)   ? Active sinus Dz >  rx augmentin then consider sinus CT  ? Alpha one AT def > check next ov    I had an extended discussion with the patient reviewing all  relevant studies completed to date and  lasting 15 to 20 minutes of a 25 minute visit    See device teaching which extended face to face time for this visit.  Each maintenance medication was reviewed in detail including emphasizing most importantly the difference between maintenance and prns and under what circumstances the prns are to be triggered using an action plan format that is not reflected in the computer generated alphabetically organized AVS which I have not found useful in most complex patients, especially with respiratory illnesses  Please see AVS for specific instructions unique to this visit that I personally wrote and verbalized to the the pt in detail and then reviewed with pt  by my nurse highlighting any  changes in therapy recommended at today's visit to their plan of care.

## 2018-03-01 NOTE — Assessment & Plan Note (Signed)
Recurrent cough ? CAP in pt with bronchiectasis with persistent symptoms on levaquin prev responsive to augmentin ? Suggesting underlying reservoir eg sinus dz > rec repeat augmentin and consider repeat sinus and chest ct when complete to assure eradication and consider fob if infiltrates persist.

## 2018-03-01 NOTE — Assessment & Plan Note (Signed)
4-5 min discussion re active cigarette smoking in addition to office E&M  Ask about tobacco use:   active Advise quitting   I emphasized that although we never turn away smokers from the pulmonary clinic, we do ask that they understand that the recommendations that we make  won't work nearly as well in the presence of continued cigarette exposure. In fact, we may very well  reach a point where we can't promise to help the patient if he/she can't quit smoking. (We can and will promise to try to help, we just can't promise what we recommend will really work)  Assess willingness:  Not committed at this point Assist in quit attempt:  Per PCP when ready Arrange follow up:   Follow up per Primary Care planned

## 2018-03-01 NOTE — Telephone Encounter (Signed)
Spoke with [facility]. Took a call report on the pt's CXR.  IMPRESSION: Suspect increase in pneumonia in the left upper lobe. Postoperative changes are noted on the left with volume loss in the left upper lobe as well as scarring.  Apparent varicoid bronchiectasis left lower lobe, better demonstrated on prior CT. No of ower cavitation is seen in this area. The appearance in this area is similar to the most recent CT.  Right lung is clear. Stable cardiac silhouette. No adenopathy is demonstrable by radiography.  Patient is status post left mastectomy with surgical clips in left axillary region.  MW - please advise. Thanks.

## 2018-03-01 NOTE — Telephone Encounter (Signed)
Awared/ see ov

## 2018-03-05 ENCOUNTER — Other Ambulatory Visit: Payer: Self-pay | Admitting: Hematology and Oncology

## 2018-03-05 DIAGNOSIS — Z170 Estrogen receptor positive status [ER+]: Principal | ICD-10-CM

## 2018-03-05 DIAGNOSIS — C50212 Malignant neoplasm of upper-inner quadrant of left female breast: Secondary | ICD-10-CM

## 2018-03-13 MED FILL — TYKERB 250 MG TABLET: 250 | 30 days supply | Qty: 120 | Fill #0

## 2018-03-15 ENCOUNTER — Ambulatory Visit: Payer: 59 | Admitting: Internal Medicine

## 2018-03-22 ENCOUNTER — Ambulatory Visit (HOSPITAL_COMMUNITY)
Admission: RE | Admit: 2018-03-22 | Discharge: 2018-03-22 | Disposition: A | Discharge status: Home / Self Care | Payer: 59 | Source: Ambulatory Visit | Attending: Adult Health | Admitting: Adult Health

## 2018-03-22 ENCOUNTER — Other Ambulatory Visit: Payer: Self-pay | Admitting: Adult Health

## 2018-03-22 DIAGNOSIS — R911 Solitary pulmonary nodule: Secondary | ICD-10-CM

## 2018-03-22 DIAGNOSIS — I899 Noninfective disorder of lymphatic vessels and lymph nodes, unspecified: Secondary | ICD-10-CM | POA: Diagnosis not present

## 2018-03-22 DIAGNOSIS — R918 Other nonspecific abnormal finding of lung field: Secondary | ICD-10-CM | POA: Diagnosis not present

## 2018-03-22 DIAGNOSIS — J439 Emphysema, unspecified: Secondary | ICD-10-CM | POA: Diagnosis not present

## 2018-03-22 DIAGNOSIS — I700 Atherosclerosis of aorta: Secondary | ICD-10-CM | POA: Diagnosis not present

## 2018-03-23 ENCOUNTER — Other Ambulatory Visit: Payer: Self-pay | Admitting: Adult Health

## 2018-03-25 NOTE — Assessment & Plan Note (Addendum)
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 Anti Her 2 therapy with Lapatinibstarted 09/17/2017 3.I discontinuedtamoxifen and started her on letrozole 2.5 mg daily.  Lapatinib toxicities:Currently on 1000 mglapatinibwith Letrozole 2.5 mg Occasional diarrhea. She is working and is very happy about it  Brain MRI 12/07/2017: New punctate enhancing metastases right parietal lobe.  Anterior frontal lobe treatment changes.  Postsurgical changes right frontal lobe. Stereotactic radiosurgery 12/19/2017 to the new right parietal lobe metastases.  CT CAP: 03/23/18: LUL cavitary arch destortion with ground glass attenuation subpleural LUL (Radiation pneumonitis vs Inf); Inc in size of left mediastinal LN (non specific), LUL nodule 4 mm  Radiology Review: Reviewed the scan and decided to continue the current therapy and rescan in 3 months. Patient does not have any signs or symptoms of pneumonia.  Return to clinic in8weeksfor follow-up

## 2018-03-26 ENCOUNTER — Inpatient Hospital Stay: Payer: 59

## 2018-03-26 ENCOUNTER — Inpatient Hospital Stay: Payer: 59 | Attending: Hematology and Oncology | Admitting: Hematology and Oncology

## 2018-03-26 ENCOUNTER — Telehealth: Payer: Self-pay | Admitting: Hematology and Oncology

## 2018-03-26 VITALS — BP 105/66 | HR 77 | Temp 99.1°F | Resp 19 | Ht 62.0 in | Wt 109.6 lb

## 2018-03-26 DIAGNOSIS — Z170 Estrogen receptor positive status [ER+]: Principal | ICD-10-CM

## 2018-03-26 DIAGNOSIS — C7931 Secondary malignant neoplasm of brain: Secondary | ICD-10-CM | POA: Insufficient documentation

## 2018-03-26 DIAGNOSIS — C50212 Malignant neoplasm of upper-inner quadrant of left female breast: Secondary | ICD-10-CM

## 2018-03-26 DIAGNOSIS — C773 Secondary and unspecified malignant neoplasm of axilla and upper limb lymph nodes: Secondary | ICD-10-CM | POA: Insufficient documentation

## 2018-03-26 DIAGNOSIS — J984 Other disorders of lung: Secondary | ICD-10-CM | POA: Diagnosis not present

## 2018-03-26 LAB — CBC WITH DIFFERENTIAL (CANCER CENTER ONLY)
Basophils Absolute: 0.1 10*3/uL (ref 0.0–0.1)
Basophils Relative: 1 %
Eosinophils Absolute: 2.4 10*3/uL — ABNORMAL HIGH (ref 0.0–0.5)
Eosinophils Relative: 22 %
HEMATOCRIT: 34.1 % — AB (ref 34.8–46.6)
Hemoglobin: 11.2 g/dL — ABNORMAL LOW (ref 11.6–15.9)
LYMPHS PCT: 24 %
Lymphs Abs: 2.6 10*3/uL (ref 0.9–3.3)
MCH: 29.7 pg (ref 25.1–34.0)
MCHC: 32.8 g/dL (ref 31.5–36.0)
MCV: 90.5 fL (ref 79.5–101.0)
MONOS PCT: 6 %
Monocytes Absolute: 0.7 10*3/uL (ref 0.1–0.9)
NEUTROS ABS: 5.3 10*3/uL (ref 1.5–6.5)
Neutrophils Relative %: 47 %
Platelet Count: 378 10*3/uL (ref 145–400)
RBC: 3.77 MIL/uL (ref 3.70–5.45)
RDW: 15.6 % — ABNORMAL HIGH (ref 11.2–14.5)
WBC Count: 11.1 10*3/uL — ABNORMAL HIGH (ref 3.9–10.3)

## 2018-03-26 LAB — CMP (CANCER CENTER ONLY)
ALBUMIN: 3.2 g/dL — AB (ref 3.5–5.0)
ALT: 8 U/L (ref 0–44)
ANION GAP: 9 (ref 5–15)
AST: 12 U/L — ABNORMAL LOW (ref 15–41)
Alkaline Phosphatase: 86 U/L (ref 38–126)
BUN: 8 mg/dL (ref 6–20)
CO2: 27 mmol/L (ref 22–32)
Calcium: 9.9 mg/dL (ref 8.9–10.3)
Chloride: 105 mmol/L (ref 98–111)
Creatinine: 0.88 mg/dL (ref 0.44–1.00)
GFR, Estimated: >60 mL/min (ref >60)
GLUCOSE: 90 mg/dL (ref 70–99)
Potassium: 4.3 mmol/L (ref 3.5–5.1)
Sodium: 141 mmol/L (ref 135–145)
Total Bilirubin: <0.2 mg/dL — ABNORMAL LOW (ref 0.3–1.2)
Total Protein: 7.6 g/dL (ref 6.5–8.1)

## 2018-03-26 NOTE — Telephone Encounter (Signed)
Gave avs and calendar ° °

## 2018-03-26 NOTE — Progress Notes (Signed)
Patient Care Team: Patient, [name] as PCP - General (General Practice) [name], MD as Referring Physician (Emergency Medicine)  DIAGNOSIS:  Encounter Diagnoses  Name Primary?  . Brain metastasis ([place]) Yes  . Malignant neoplasm of upper-inner quadrant of left breast in female, estrogen receptor positive ([place])     SUMMARY OF ONCOLOGIC HISTORY:   Breast cancer of upper-inner quadrant of left female breast ([place])   06/08/2012 Initial Diagnosis    invasive ductal carcinoma that was ER positive PR positive HER-2/neu positive measuring 3.1 cm by MRI criteria. Ki-67 was 70% HER-2 was amplified with a ratio 2.91    07/12/2012 - 07/17/2013 Neo-Adjuvant Chemotherapy    TCH 6 followed by Herceptin maintenance    12/11/2012 Surgery    Left breast lumpectomy: 1.8 cm tumor 1 positive sentinel node, axillary lymph node dissection 02/08/2013 showed 0/13 lymph nodes    03/25/2013 - 05/06/2013 Radiation Therapy    Adjuvant radiation therapy    06/05/2013 - 07/20/2017 Anti-estrogen oral therapy    Tamoxifen 20 mg daily    07/27/2017 Relapse/Recurrence    MRI Brain: 3.4 x 2.9 x 2.9 cm RIGHT frontal lobe mass with imaging characteristics of solitary metastasis. Extensive vasogenic edema resulting in 9 mm RIGHT to LEFT midline shift. Equivocal very early LEFT ventricle entrapment.     08/04/2017 Surgery    Rt frontal brain resection: Poorly differentiated tumor IHC suggests breast primary ER and PR Positive    08/25/2017 - 09/04/2017 Radiation Therapy    Stereotactic radiation    09/18/2017 -  Anti-estrogen oral therapy    Lapatinib with letrozole    12/18/2017 - 12/19/2017 Radiation Therapy    New right parietal lobe metastases status post SRS     CHIEF COMPLIANT: Follow-up to discuss the results of CT chest  INTERVAL HISTORY: Melissa [name] is a 52 year old with above-mentioned history of metastatic breast cancer who underwent CT chest and is here to discuss results.  She is complaining  of a lot of cough and expectoration.  She has seen Dr. Melvyn Novas who prescribed her antibiotics and the steroids.  She completed the course and still continues to have the symptoms.  She denies any fevers or chills.  CT of the chest showed a cavitary mass in the left upper lobe.  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  She has not been feeling too well since yesterday and did not go to work today.  REVIEW OF SYSTEMS:   Constitutional: Denies fevers, chills or abnormal weight loss Eyes: Denies blurriness of vision Ears, nose, mouth, throat, and face: Denies mucositis or sore throat Respiratory: Cough with expectoration Cardiovascular: Denies palpitation, chest discomfort Gastrointestinal:  Denies nausea, heartburn or change in bowel habits Skin: Denies abnormal skin rashes Lymphatics: Denies new lymphadenopathy or easy bruising Neurological:Denies numbness, tingling or new weaknesses Behavioral/Psych: Mood is stable, no new changes  Extremities: No lower extremity edema   All other systems were reviewed with the patient and are negative.  I have reviewed the past medical history, past surgical history, social history and family history with the patient and they are unchanged from previous note.  ALLERGIES:  is allergic to aspirin; protonix [pantoprazole]; and iodinated diagnostic agents.  MEDICATIONS:  Current Outpatient Medications  Medication Sig Dispense Refill  . acetaminophen-codeine (TYLENOL #3) 300-30 MG tablet Take 1 tablet by mouth every 4 (four) hours as needed (cough). 30 tablet 0  .  albuterol (VENTOLIN HFA) 108 (90 BASE) MCG/ACT inhaler Inhale 2 puffs into the lungs every 6 (six) hours as needed. 1 Inhaler 3  . budesonide-formoterol (SYMBICORT) 160-4.5 MCG/ACT inhaler Inhale 2 puffs into the lungs daily. 1 Inhaler 11  . letrozole (FEMARA) 2.5 MG tablet Take 1 tablet (2.5 mg total) by mouth daily. 90  tablet 3  . levofloxacin (LEVAQUIN) 500 MG tablet Take 500 mg by mouth daily.    . mometasone-formoterol (DULERA) 100-5 MCG/ACT AERO Inhale 1 puff into the lungs daily as needed for shortness of breath.    . naproxen sodium (ALEVE) 220 MG tablet Take 440 mg by mouth 2 (two) times daily as needed (headache).     . predniSONE (DELTASONE) 10 MG tablet Take  4 each am x 2 days,   2 each am x 2 days,  1 each am x 2 days and stop 14 tablet 0  . TYKERB 250 MG tablet TAKE 4 TABLETS (1,000 MG TOTAL) BY MOUTH DAILY. TAKE ON AN EMPTY STOMACH, 1 HOUR BEFORE OR 2 HOURS AFTER MEALS. 150 tablet 3   No current facility-administered medications for this visit.     PHYSICAL EXAMINATION: ECOG PERFORMANCE STATUS: 1 - Symptomatic but completely ambulatory  There were no vitals filed for this visit. There were no vitals filed for this visit.  GENERAL:alert, no distress and comfortable SKIN: skin color, texture, turgor are normal, no rashes or significant lesions EYES: normal, Conjunctiva are pink and non-injected, sclera clear OROPHARYNX:no exudate, no erythema and lips, buccal mucosa, and tongue normal  NECK: supple, thyroid normal size, non-tender, without nodularity LYMPH:  no palpable lymphadenopathy in the cervical, axillary or inguinal LUNGS:  no crackles or wheezes HEART: regular rate & rhythm and no murmurs and no lower extremity edema ABDOMEN:abdomen soft, non-tender and normal bowel sounds MUSCULOSKELETAL:no cyanosis of digits and no clubbing  NEURO: alert & oriented x 3 with fluent speech, no focal motor/sensory deficits EXTREMITIES: No lower extremity edema   LABORATORY DATA:  I have reviewed the data as listed CMP Latest Ref Rng & Units 01/18/2018 12/19/2017 10/18/2017  Glucose 70 - 99 mg/dL 120(H) 87 110  BUN 6 - 20 mg/dL _0 Creatinine 0.44 - 1.00 mg/dL 1.03(H) 0.97 0.86  Sodium 135 - 145 mmol/L 141 139 139  Potassium 3.5 - 5.1 mmol/L 4.5 4.3 3.7  Chloride 98 - 111 mmol/L 102 104 105    CO2 22 - 32 mmol/L _1 Calcium 8.9 - 10.3 mg/dL 10.9(H) 10.1 9.7  Total Protein 6.5 - 8.1 g/dL 8.3(H) 7.4 7.1  Total Bilirubin 0.3 - 1.2 mg/dL 0.2(L) 0.3 <0.2(L)  Alkaline Phos 38 - 126 U/L 88 80 72  AST 15 - 41 U/L _2 ALT 0 - 44 U/L _3 Lab Results  Component Value Date   WBC 11.1 (H) 03/26/2018   HGB 11.2 (L) 03/26/2018   HCT 34.1 (L) 03/26/2018   MCV 90.5 03/26/2018   PLT 378 03/26/2018   NEUTROABS 5.3 03/26/2018    ASSESSMENT & PLAN:  Breast cancer of upper-inner quadrant of left female breast ([facility]) 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 Anti Her 2 therapy with Lapatinibstarted 09/17/2017 3.I discontinuedtamoxifen and started her on letrozole 2.5 mg daily.  Lapatinib toxicities:Currently on 1000 mglapatinibwith Letrozole 2.5 mg Occasional diarrhea. She is working and is very happy about it  Brain MRI 12/07/2017: New punctate enhancing metastases right parietal lobe.  Anterior frontal lobe treatment changes.  Postsurgical changes right frontal lobe. Stereotactic radiosurgery 12/19/2017 to the new right parietal lobe metastases.  CT CAP: 03/23/18: LUL cavitary arch destortion with ground glass attenuation subpleural LUL (Radiation pneumonitis vs Inf); Inc in size of left mediastinal LN (non specific), LUL nodule 4 mm  Radiology Review: Reviewed the scan and decided to continue the current therapy and rescan in 3 months. Patient does not have any signs or symptoms of pneumonia.  Return to clinic in8weeksfor  follow-up    No orders of the defined types were placed in this encounter.  The patient has a good understanding of the overall plan. she agrees with it. she will call with any problems that may develop before the next visit here.   Harriette Ohara, MD 03/26/18

## 2018-03-27 ENCOUNTER — Other Ambulatory Visit (INDEPENDENT_AMBULATORY_CARE_PROVIDER_SITE_OTHER): Payer: 59

## 2018-03-27 ENCOUNTER — Ambulatory Visit (INDEPENDENT_AMBULATORY_CARE_PROVIDER_SITE_OTHER): Payer: 59 | Admitting: Internal Medicine

## 2018-03-27 ENCOUNTER — Encounter: Payer: Self-pay | Admitting: Internal Medicine

## 2018-03-27 ENCOUNTER — Telehealth: Payer: Self-pay | Admitting: *Deleted

## 2018-03-27 VITALS — BP 116/62 | HR 111 | Temp 98.9°F | Ht 62.0 in | Wt 108.8 lb

## 2018-03-27 DIAGNOSIS — J479 Bronchiectasis, uncomplicated: Secondary | ICD-10-CM | POA: Diagnosis not present

## 2018-03-27 DIAGNOSIS — F17210 Nicotine dependence, cigarettes, uncomplicated: Secondary | ICD-10-CM

## 2018-03-27 DIAGNOSIS — J984 Other disorders of lung: Secondary | ICD-10-CM | POA: Diagnosis not present

## 2018-03-27 LAB — SEDIMENTATION RATE: Sed Rate: 115 mm/hr — ABNORMAL HIGH (ref 0–30)

## 2018-03-27 MED ORDER — AMOXICILLIN-POT CLAVULANATE 875-125 MG PO TABS: 1.0000 | ORAL_TABLET | Freq: Two times a day (BID) | ORAL | 0 refills | Status: AC

## 2018-03-27 NOTE — Patient Instructions (Addendum)
Augmentin 875 mg take one pill twice daily  X 10 days - take at breakfast and supper with large glass of water.  It would help reduce the usual side effects (diarrhea and yeast infections) if you ate cultured yogurt at lunch.    Please remember to go to the lab department downstairs in the basement  for your tests - we will call you with the results when they are available.  The key is to stop smoking completely before smoking completely stops you!    Please schedule a follow up office visit in 2 weeks, sooner if needed

## 2018-03-27 NOTE — Progress Notes (Signed)
Subjective:     Patient ID: Melissa [name], female   DOB: 1966-05-01 .   MRN: [id number]    Brief patient profile:  34  yowf active smoker dx'd with [place] in [place] requiring [place] in 1997 and 3 years of antifungal rx with fluconazole with residual sob heavy doe and sev times a year pain in L shoulder and lower back up to couple times a year lasts up  to a couple  of weeks maybe better with abx, maybe some worse joint pains,  Sometimes some sweats, occ with nasty mucus but no blood referred to pulmonary clinic for recurrent cp 01/2011 with documented Bronchiectasis in 2012    History of Present Illness  01/26/2011 Initial pulmonary office eval cc recurrent pleuritic L ant chest  pain, more severe than usual episodes,  rad neck,  Onset x 2 weeks with abrupt onset,  no better on lodine> stopped.   No sweats or nasty mucus now. Some better at day of ov p rx = dex and zithromax but  some sorethroat since dex with overt hb. rec ct chest POS BRONCHIECTASIS    Nov 2013 Breast Ca > lumpectomy with pos node> RT and chemo Finished Jan 2015     07/15/2016 acute extended ov/Wert re: ? Bronchiectasis /dulera 100 2 bid maint / still smoking Chief Complaint  Patient presents with  . Acute Visit    lower right back pain x 1 wk- radiating to her right rib area over the past 2 days. She feels discomfort when she takes a deep breath.  She also c/o episodes of feeling light headed. She is not having any SOB and no more cough than usual.    acute onset one week prior to OV  Very Similar pattern/characteristics to prev flares of bronchiectasis related cp on L side but this CP is located R post rad anteriorly / worse with breathing/ worse supine/ moderately severe No sob or need for rescue saba / some better with tylenol  rec levaquin 750 mg one daily x 5 days  For cough / congestion > mucinex dm up to 1200 every 12 hours as needed  Plan A = Automatic = Symbicort 80 1-2 every 12 hours  Work on  maintaining perfect  inhaler technique:  Plan B = Backup Only use your albuterol as a rescue medication CTa  07/19/16 neg PE or explanation for R cp c/w mscp from cough     11/25/2016  f/u ov/Wert re: f/u apparent pna LLL in pt with known bronchiectasis/ cystic lesion LLL  Chief Complaint  Patient presents with  . Follow-up    Pt states dxed with PNA x 3 wks ago.  She states she was told a few days ago that her CXR showed a lung mass and told her that she needed a CT Chest. She states having right side pain and minimal SOB.    cough and cp improved from last ov/ no excess/ purulent sputum or mucus plugs then mid April 2018 abrupt onset  nasal stuffiness and more chest congestion more yellowish then green/chills >  seen in [place]  With dx "L sided pna" rx x levaquin x 10 days > green mucus gone /chills gone >  Back to baseline level of chronic mscp R > L worse  Only with cough and back to nl ex tol   rec The key is to stop smoking completely before smoking completely stops you!      02/27/2017  f/u ov/Wert re:  Bronchiectasis with nl pfts on sybm 80 2bid / still smoking  Chief Complaint  Patient presents with  . Follow-up    Breathing is doing well. No new co's today.   cps all gone and Not limited by breathing from desired activities   No need for saba  rec Stop smoking    06/16/2017  f/u ov/Wert re:  Bronchiectasis s/p valley fever/ symb 80 2bid  Chief Complaint  Patient presents with  . Follow-up    Breathing is overall doing well. She states she has been having some HA and was dxed with sinus infection a few days ago- taking levaquin.    Ha  frontal and bilateral and worse in am and better on levquin/medrol  rec  Call if not better after you finish your antibiotics The key is to stop smoking completely before smoking completely stops you!   Brain surgery Jan 2019 then RT Feb 2019 finished taking dex around late Feb    11/22/2017 acute extended ov/Wert re:  Bronchiectasis /  symb 67 2bid  Chief Complaint  Patient presents with  . Acute Visit    Having trouble breathing productive cough mucus yellow in color. sharp pain in back.  around early April 2019  nasal congestion / cough slt yellow thick mucus  rx sudaphed seemed some better Then abruptly 2 d prior to [place] chills/ L post cp similar location as prior episodes of bronchiectasis and more sob with activity but not at rest though feels can't take a deep comfortable breath -   Cp better aleve and heating pad. Having to use"rescue" again sev times day and occ noct  (confused between dulera and saba though)  rec Plan A = Automatic = Symbicort or dulera  One twice daily of either one  Work on inhaler technique:  Plan B = Backup Only use your albuterol (ventolin) as a rescue medication Augmentin 875 mg take one pill twice daily  X 10 days -  Please remember to go to the  x-ray department downstairs in the basement  for your tests - we will call you with the results when they are available.    02/28/2018 acute extended ov/Wert re: cough/ still actively smoking  Chief Complaint  Patient presents with  . Acute Visit    c/o chest congestion, sinus congestion, sob, headaches Xapprox 1 week. Was dx'ed by UC with PNA, not improving on abx.   doing Fine p last ov then abruptly worse  02/22/18 with st, stuffy head and chest with clear mucus, aching all ever, chill temp 100 and increased sob/ need for saba but only once or twice a week Rx with levaquin 02/22/18 but no better and has 2 days left and using saba up to 4 x daily but neb only once, helped some. Doe = MMRC3 = can't walk 100 yards even at a slow pace at a flat grade s stopping due to sob  - severe cough disrupting sleep and wants that yellow cough med that has codeine (? Tussionex/)    Has same somewhat pleuritic discomfort L post chest she always gets with bronchiectasis flares, no better with levaquin  rec Ok to increase the symbicort 160 to Take 2 puffs first  thing in am and then another 2 puffs about 12 hours later.  Work on inhaler technique:  relax and gently blow all the way out then take a nice smooth deep breath back in, triggering the inhaler at same time you start breathing in.  Hold for up to 5 seconds if you can. Blow out thru nose. Rinse and gargle with water when done Augmentin 875 mg take one pill twice daily  X 10 days - take at breakfast and supper with large glass of water.  It would help reduce the usual side effects (diarrhea and yeast infections) if you ate cultured yogurt at lunch.  Prednisone 10 mg take  4 each am x 2 days,   2 each am x 2 days,  1 each am x 2 days and stop  For cough > tylenol # 3 one every 4 and flutter valve and mucinex 1200 mg every 12 hours as needed  The key is to stop smoking completely before smoking completely stops you!  Please remember to go to the  x-ray department downstairs in the basement  for your tests - we will call you with the results when they are available. - needs alpha one screen on return     03/27/2018  f/u ov/Wert re: pulmonary infiltrates s/p completion of RT to chest 2014 / main on tykerb Chief Complaint  Patient presents with  . Follow-up    Pt had CT Chest done 03/22/18.  She states her breathing has been worse since her last appt here. She has been coughing more over the past 2-3 days- prod with pale yellow sputum. She has been using her albuterol inhaler 2 x daily on average.   Dyspnea:  More sob than usual since  new different L axillary pleuritic cp  acute one day prior to OV   Cough: minimal pale yllow mucus  Sleeping: freq awakening  SABA use: 2 x daily  02: none No more than one Tylenol #3 per day     No obvious day to day or daytime variability or assoc mucus plugs or hemoptysis or   chest tightness, subjective wheeze or overt sinus or hb symptoms.     Also denies any obvious fluctuation of symptoms with weather or environmental changes or other aggravating or  alleviating factors except as outlined above   No unusual exposure hx or h/o childhood pna/ asthma or knowledge of premature birth.  Current Allergies, Complete Past Medical History, Past Surgical History, Family History, and Social History were reviewed in [employer] record.  ROS  The following are not active complaints unless bolded Hoarseness, sore throat, dysphagia, dental problems, itching, sneezing,  nasal congestion or discharge of excess mucus or purulent secretions, ear ache,   fever, chills, sweats, unintended wt loss or wt gain, classically  exertional cp,  orthopnea pnd or arm/hand swelling  or leg swelling, presyncope, palpitations, abdominal pain, anorexia, nausea, vomiting, diarrhea  or change in bowel habits or change in bladder habits, change in stools or change in urine, dysuria, hematuria,  rash, arthralgias, visual complaints, headache, numbness, weakness or ataxia or problems with walking or coordination,  change in mood or  memory.        Current Meds  Medication Sig  . acetaminophen-codeine (TYLENOL #3) 300-30 MG tablet Take 1 tablet by mouth every 4 (four) hours as needed (cough).  Marland Kitchen albuterol (VENTOLIN HFA) 108 (90 BASE) MCG/ACT inhaler Inhale 2 puffs into the lungs every 6 (six) hours as needed.  . budesonide-formoterol (SYMBICORT) 160-4.5 MCG/ACT inhaler Inhale 2 puffs into the lungs daily.  Marland Kitchen letrozole (FEMARA) 2.5 MG tablet Take 1 tablet (2.5 mg total) by mouth daily.  . naproxen sodium (ALEVE) 220 MG tablet Take 440 mg by mouth 2 (two) times  daily as needed (headache).   . TYKERB 250 MG tablet TAKE 4 TABLETS (1,000 MG TOTAL) BY MOUTH DAILY. TAKE ON AN EMPTY STOMACH, 1 HOUR BEFORE OR 2 HOURS AFTER MEALS.                  Objective:   Physical Exam  amb wf nad   Vital signs reviewed - Note on arrival 02 sats  96% on RA     Wt 118  02/10/11 >  05/03/2011  118 > 117 03/23/2012 >  122 07/30/2012 > 09/29/2014 121> 06/13/2016   119 > 07/15/2016  118 > 08/09/2016  118  > 11/25/2016   114 > 02/27/2017   116 > 06/16/2017  119 >  11/22/2017 112 >  02/28/2018 110 > 03/27/2018   108     HEENT: nl   turbinates bilaterally, and oropharynx. Nl external ear canals without cough reflex/  Molar remnants R lower/ top denture   NECK :  without JVD/Nodes/TM/ nl carotid upstrokes bilaterally   LUNGS: no acc muscle use,  Nl contour chest with minimal insp/exp rhonchi  bilaterally without cough on insp or exp maneuvers   CV:  RRR  no s3 or murmur or increase in P2, and no edema   ABD:  soft and nontender with nl inspiratory excursion in the supine position. No bruits or organomegaly appreciated, bowel sounds nl  MS:  Nl gait/ ext warm without deformities, calf tenderness, cyanosis or clubbing No obvious joint restrictions   SKIN: warm and dry without lesions    NEURO:  alert, approp, nl sensorium with  no motor or cerebellar deficits apparent.         . I personally reviewed images and agree with radiology impression as follows:   Chest CT  03/22/18 Moderate to advanced changes of emphysema noted. There is no pleural effusion identified. Thick walled cavitary mass is identified within the left upper lobe within an area of previous emphysematous change. This measures 5.4 by 2.7 by 5.0 cm. Surrounding ground-glass attenuation is identified.  Again noted is left upper lobe bronchiectasis with fluid filled bronchi, image 57/5. Unchanged from previous exam. Cylindrical and varicoid bronchiectasis within the left lower lobe is again identified and appears similar to previous exam. Scattered small tree-in-bud nodules are noted which are likely inflammatory in etiology. 4 mm nodule within the left upper lobe is identified, image 48/5 and is more conspicuous on today's study. Previously 3 mm. Stable small nodule in the anterior right lower lobe measuring 5 mm, image 81/5.            Lab Results  Component Value Date   ESRSEDRATE 115 (H)  03/27/2018   ESRSEDRATE 84 (H) 01/26/2011     Labs ordered 03/27/2018  :  Quant TB  / alpha one       Assessment:

## 2018-03-27 NOTE — Telephone Encounter (Signed)
Spoke with the pt and scheduled appt for 2:15 today

## 2018-03-27 NOTE — Telephone Encounter (Signed)
-----   Message from Tanda Rockers, MD sent at 03/26/2018  5:12 PM EDT ----- She has bronchiectasis in the LUL and more likely this is complication of that so i'll see her back in the office first and go from there.  Thx!!!   Magda Paganini: needs ov this week if possible with me only ----- Message ----- From: Nicholas Lose, MD Sent: 03/26/2018   3:52 PM EDT To: Tanda Rockers, MD  Legrand Como, Please see the CT chest of Melissa [name]. She has significant LUL cavitary mass. Can you see if she needs a  Bronch?  She ahs a history of "Valley Fever". Thanks [employer]

## 2018-03-29 ENCOUNTER — Encounter: Payer: Self-pay | Admitting: Internal Medicine

## 2018-03-29 DIAGNOSIS — J984 Other disorders of lung: Secondary | ICD-10-CM | POA: Insufficient documentation

## 2018-03-29 NOTE — Assessment & Plan Note (Signed)
-   CT chest   01/26/2011 Status post left upper lobectomy.  Focal bronchiectasis / scarring medially in the left lung apex with  surrounding inflammatory changes  - Pneumovax 08/12/11  And prevnar 08/09/2016  - 07/15/2016  After extensive coaching HFA effectiveness =    75% symbicort 80 2bid  - PFT's  08/09/2016  FEV1 2.05 (77 % ) ratio 70  p 5 % improvement from saba p dulera 100 x2  prior to study with DLCO  46 % corrects to 55  % for alv volume    - alpha one screening 03/27/2018    maint on symbicort for mc function but could probably try lower ICS dose to reduce risk of infection   For now no change rx/    I had an extended discussion with the patient reviewing all relevant studies completed to date and  lasting 15 to 20 minutes of a 25 minute visit    Each maintenance medication was reviewed in detail including most importantly the difference between maintenance and prns and under what circumstances the prns are to be triggered using an action plan format that is not reflected in the computer generated alphabetically organized AVS.     Please see AVS for specific instructions unique to this visit that I personally wrote and verbalized to the the pt in detail and then reviewed with pt  by my nurse highlighting any  changes in therapy recommended at today's visit to their plan of care.

## 2018-03-29 NOTE — Assessment & Plan Note (Signed)
4-5 min discussion re active cigarette smoking in addition to office E&M  Ask about tobacco use:   ongoing Advise quitting   > 3 min Discussed the risks and costs (both direct and indirect)  of smoking relative to the benefits of quitting but patient unwilling to commit at this point to a specific quit date.   Assist in quit attempt:  Per PCP when ready Arrange follow up:   Follow up per Primary Care planned

## 2018-03-29 NOTE — Assessment & Plan Note (Addendum)
Newly since CT 01/18/18 on  chest 03/22/18 assoc with acute onset L axillary cp 03/28/18 in area of previous blebs s a/f level  - Quant TB 03/27/2018    Reviewed serial image with radiology (Dr Madie Reno) >> classic pattern of acute and likely bacterial infection in area of previous blebs so rec rx augmentin x 10 days then ov   Very difficult and risky to consider bx in this setting    Discussed in detail all the  indications, usual  risks and alternatives  relative to the benefits with patient who agrees to proceed with conservative f/u as outlined p rx with abx first

## 2018-03-30 ENCOUNTER — Telehealth: Payer: Self-pay | Admitting: Internal Medicine

## 2018-03-30 DIAGNOSIS — J479 Bronchiectasis, uncomplicated: Secondary | ICD-10-CM

## 2018-03-30 MED ORDER — BUDESONIDE-FORMOTEROL FUMARATE 160-4.5 MCG/ACT IN AERO: 2.0000 | INHALATION_SPRAY | Freq: Every day | RESPIRATORY_TRACT | 11 refills | Status: DC

## 2018-03-30 MED ORDER — BUDESONIDE-FORMOTEROL FUMARATE 80-4.5 MCG/ACT IN AERO: 2.0000 | INHALATION_SPRAY | Freq: Two times a day (BID) | RESPIRATORY_TRACT | 5 refills | Status: DC

## 2018-03-30 NOTE — Telephone Encounter (Signed)
Spoke with patient, patient made aware symbicort has been sent to pharmacy. Patient voiced over phone did we find out how much it costs. I explained to patient typically we don't inquire about cost of medication however if costs too much we can help enroll her in patient assistance. Patient states Dr Melvyn Novas told her we have discount cards whenever she needed them. Informed patient I would fid out what discount card she was referring to and get back with her. Sample closet checked there are currently no discount cards for symbicort.   MW please advise.

## 2018-03-30 NOTE — Telephone Encounter (Signed)
Pt is aware of dosing change per MW and Rx sent to pharmacy. Co-pay card assistance has been placed at front for pick up by patient on Monday 04-02-18. Nothing more needed at this time.

## 2018-03-30 NOTE — Telephone Encounter (Signed)
They are on the counter where I usually sit After further reflection would like to change her to symbicort 80 2bid with the other option = dulera 100 2bid but I think with the card on my test the Symbicort 80 should be free

## 2018-03-31 LAB — ALPHA-1 ANTITRYPSIN PHENOTYPE: A-1 Antitrypsin, Ser: 267 mg/dL — ABNORMAL HIGH (ref 83–199)

## 2018-03-31 LAB — QUANTIFERON-TB GOLD PLUS
MITOGEN-NIL: 9.49 [IU]/mL
NIL: 0.09 [IU]/mL
QuantiFERON-TB Gold Plus: NEGATIVE

## 2018-03-31 IMAGING — MG DIGITAL DIAGNOSTIC BILATERAL MAMMOGRAM WITH TOMO AND CAD
6 of 9 series · 6 of 25 positions shown · non-contrast
Comparison: 05/31/2016, 02/10/2015 and earlier.

CLINICAL DATA: 51-year-old who underwent malignant lumpectomy of
the upper left breast in December 2012, after neoadjuvant
chemotherapy.Adjuvant radiation therapy. Patient was diagnosed in
July 2017, with a brain metastasis from the breast primary for
which the patient recently completed radiation therapy and is
currently taking letrozole. Annual evaluation.

EXAM:
DIGITAL DIAGNOSTIC BILATERAL MAMMOGRAM WITH CAD AND TOMO

[L MLO]
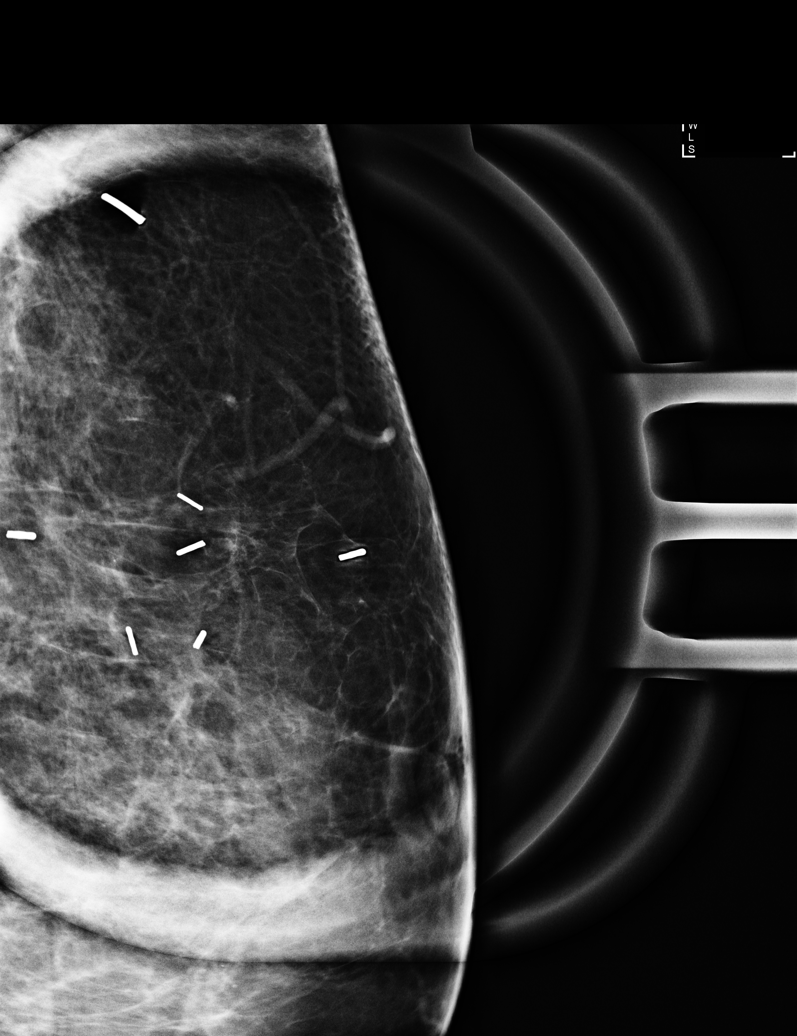

[R MLO synth-2D]
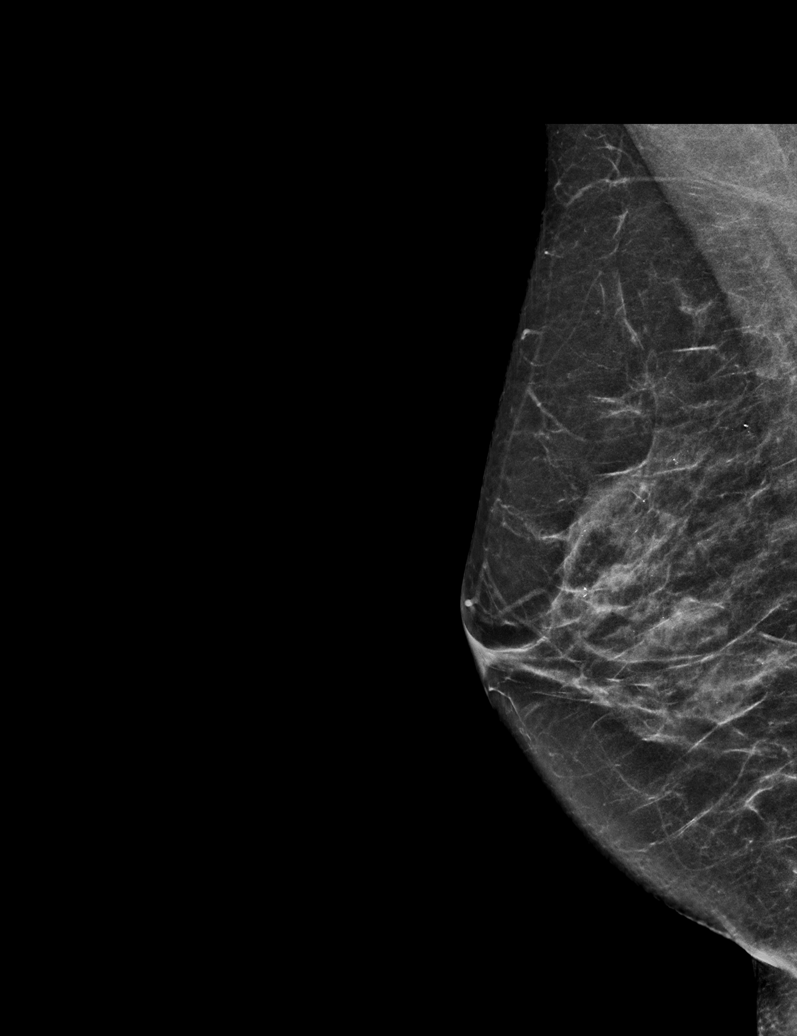

[L CC synth-2D]
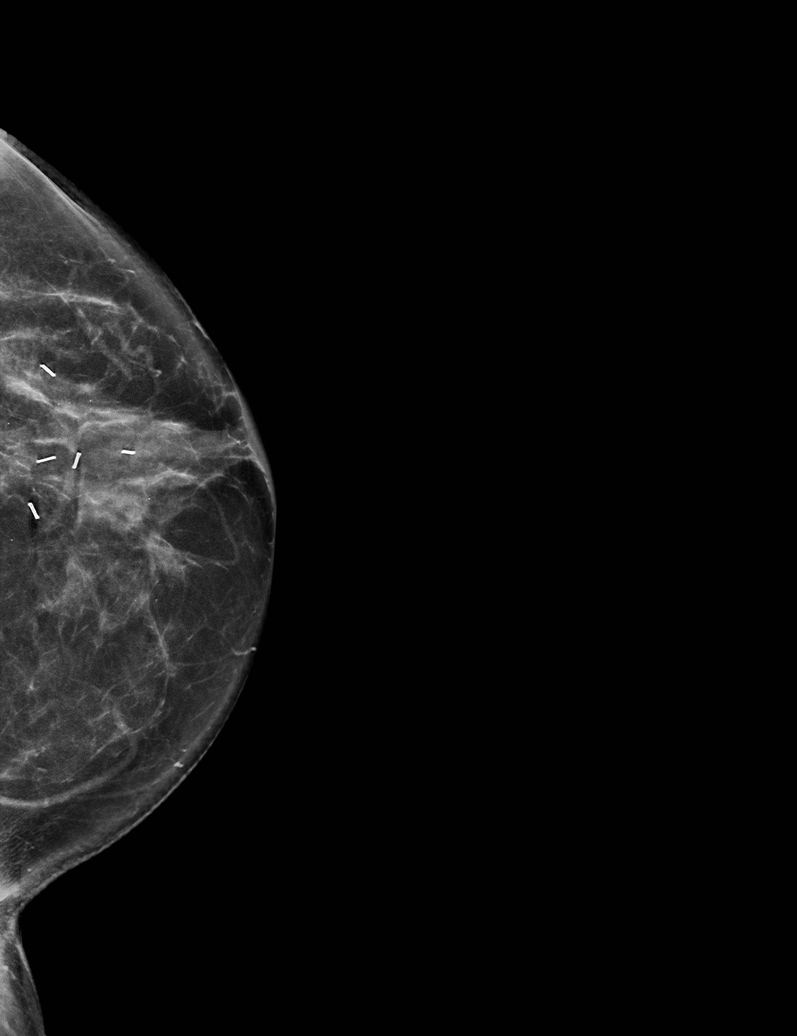

[L MLO synth-2D]
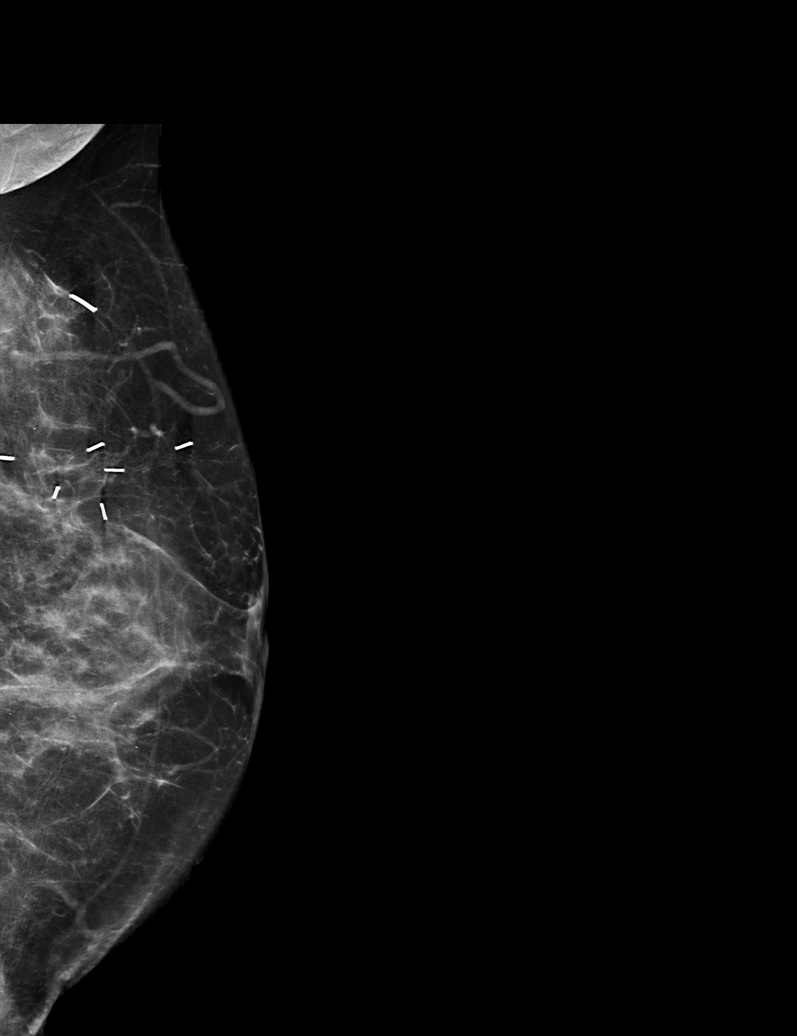

[R CC synth-2D]
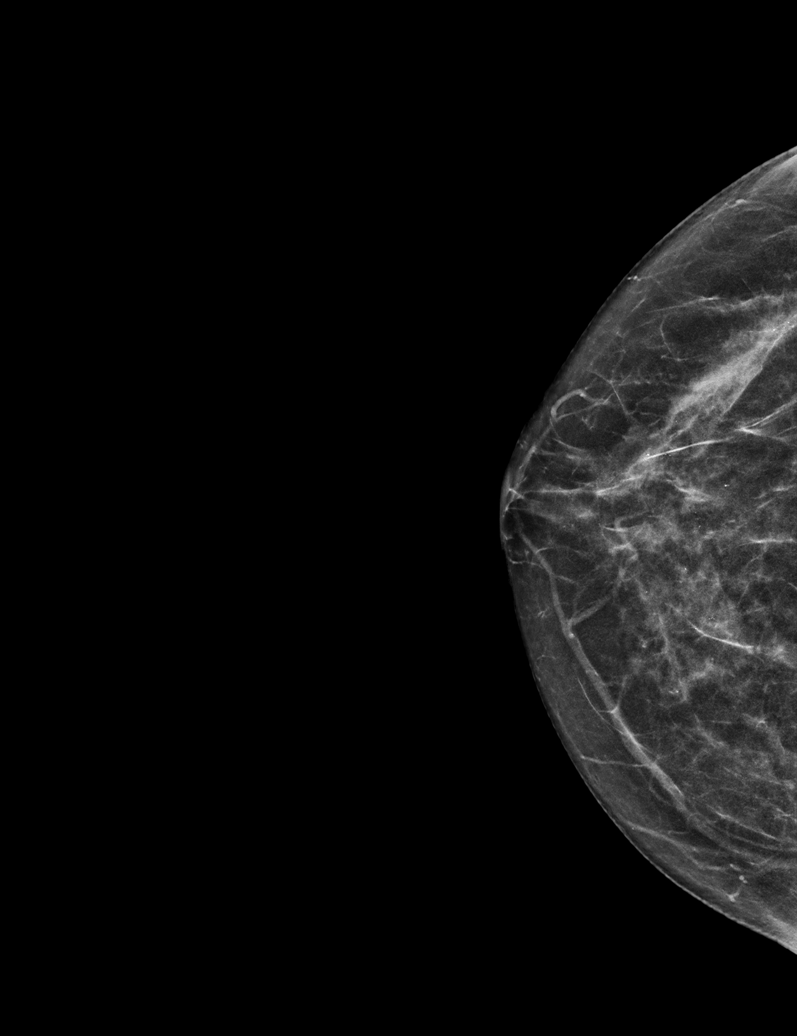

[L MLO tomo · tomo slice 33/65.0]
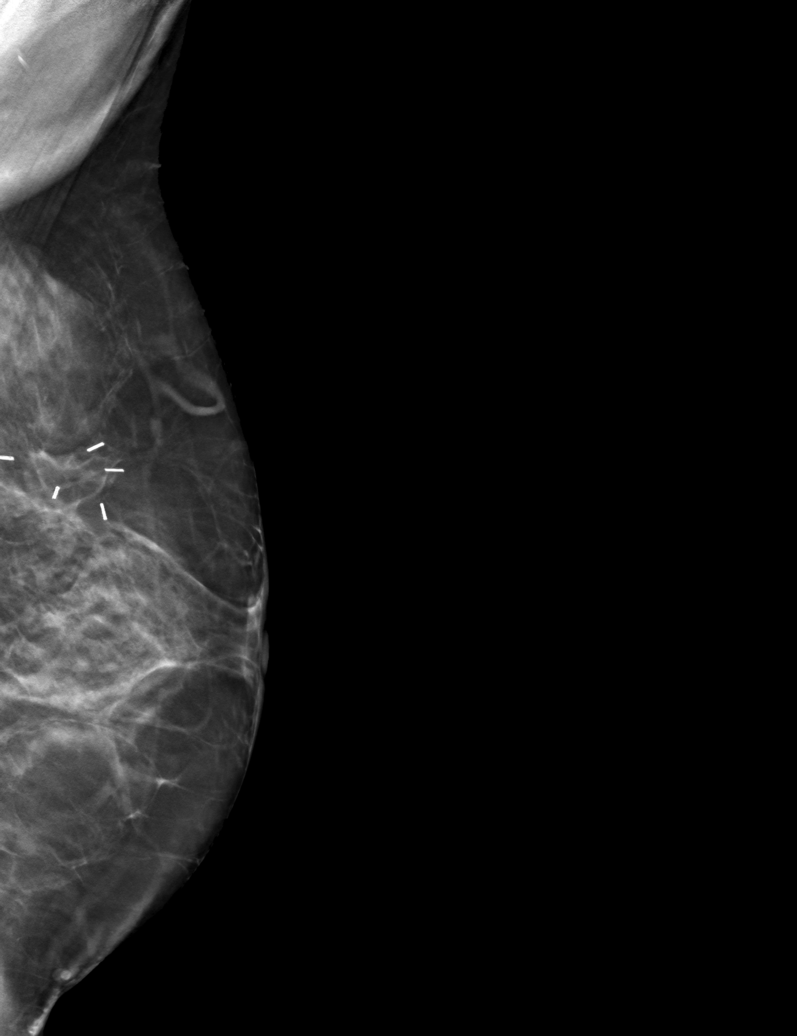

[6 of 25 positions shown; findings below may reference images not displayed]

ACR Breast Density Category c: The breast tissue is heterogeneously
dense, which may obscure small masses.
FINDINGS: Tomosynthesis and synthesized full field CC and MLO views of both
breasts were obtained. A standard spot magnification MLO view of the
lumpectomy site in the left breast was also obtained.

Post surgical scar/architectural distortion at the lumpectomy site
in the upper outer left breast at middle to posterior depth.
Surgical clips in the left axilla at the site of prior node removal.
No new or suspicious findings in the left breast.

No findings suspicious for malignancy in the right breast.

Mammographic images were processed with CAD.
IMPRESSION: 1. No mammographic evidence of malignancy involving either breast.
2. Expected post lumpectomy changes involving the left breast.

RECOMMENDATION:
Bilateral diagnostic mammography in 1 year.

I have discussed the findings and recommendations with the patient.
Results were also provided in writing at the conclusion of the
visit. If applicable, a reminder letter will be sent to the patient
regarding the next appointment.

BI-RADS CATEGORY  2: Benign.

## 2018-04-02 NOTE — Progress Notes (Signed)
Spoke with pt and notified of results per Dr. Wert. Pt verbalized understanding and denied any questions. 

## 2018-04-03 ENCOUNTER — Other Ambulatory Visit: Payer: Self-pay | Admitting: *Deleted

## 2018-04-04 ENCOUNTER — Telehealth: Payer: Self-pay | Admitting: Hematology and Oncology

## 2018-04-04 NOTE — Telephone Encounter (Signed)
Per 9/24 sch message - resent referral in [facility] -

## 2018-04-09 ENCOUNTER — Ambulatory Visit (INDEPENDENT_AMBULATORY_CARE_PROVIDER_SITE_OTHER): Payer: 59 | Admitting: Internal Medicine

## 2018-04-09 ENCOUNTER — Encounter: Payer: Self-pay | Admitting: Internal Medicine

## 2018-04-09 VITALS — BP 98/65 | HR 91 | Temp 98.3°F | Ht 62.0 in | Wt 110.0 lb

## 2018-04-09 DIAGNOSIS — J984 Other disorders of lung: Secondary | ICD-10-CM

## 2018-04-09 NOTE — Progress Notes (Signed)
Patient ID: Melissa [name], female   DOB: [birth year], 52 y.o.   MRN: [id number]  HPI  Melissa [name] is a 52 yo F who was diagnosed originally with breast cancer in 2013 sp/p lumpectomy and chemo but has been recently dxi with metastatic breast cancer with brain metastases who initially had right frontal brain resection followed by stereotactic radiation and has been on lapatinib with letrozole in Jan 2019  Recent MRI on 12/07/2017 revealed a new right parietal lobe lesion and she underwent SRS on 12/18/2017.  She has been tolerating lapatinib fairly well without any diarrhea.   She also sees dr wert for hx of bronchiectasis but of late has required being retreated for pneumonia for the 3rd/4th time this year. She is currently on abtx and feels some improvement. Her CT showing developlmetn of cavitary mass by my read. 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 Known to have cavitary lesions then. Cocci serology was negative. In oct 1997. 4.5cm caviatry lesion in the LUL with air fluid level. At that time concerned about coccidioidomycosis. She eventulaly ended getting lul lobectomy  In nov 1997. Path showed evidence of coccidioidomycosis immitis.  She was seen by id at that time and also treated with high dose fluconazole for several months, which was stopped in march 1998. She did have follow up in June 1999 for monitoring a 2.2 x 2 cm anterior mediastinal mass that has not changed in comparison to jan 1999.   IMPRESSION: 1. There has been interval development of a large area of cavitary masslike architectural distortion with ground-glass attenuation in the subpleural anterior left upper lobe. Findings are favored to represent sequelae of radiation pneumonitis. Superimposed infection is not excluded. Correlate for any clinical signs or symptoms of pneumonia. 2. Increase in size of left-sided  mediastinal lymph nodes which now measure up to 1 cm. Findings are nonspecific but are likely reactive in the setting of inflammation/infection. Metastatic disease is considered less favored but not excluded. 3. Small nonspecific left upper lobe pulmonary nodule is slightly increased in size now measuring 4 mm, previously 3 mm. Attention in this nodule on follow-up imaging is advised. 4. Aortic Atherosclerosis (ICD10-I70.0) and Emphysema (ICD10-J43.9).  Outpatient Encounter Medications as of 04/09/2018  Medication Sig  . acetaminophen-codeine (TYLENOL #3) 300-30 MG tablet Take 1 tablet by mouth every 4 (four) hours as needed (cough).  Marland Kitchen albuterol (VENTOLIN HFA) 108 (90 BASE) MCG/ACT inhaler Inhale 2 puffs into the lungs every 6 (six) hours as needed.  . budesonide-formoterol (SYMBICORT) 80-4.5 MCG/ACT inhaler Inhale 2 puffs into the lungs 2 (two) times daily.  Marland Kitchen letrozole (FEMARA) 2.5 MG tablet Take 1 tablet (2.5 mg total) by mouth daily.  . naproxen sodium (ALEVE) 220 MG tablet Take 440 mg by mouth 2 (two) times daily as needed (headache).   . TYKERB 250 MG tablet TAKE 4 TABLETS (1,000 MG TOTAL) BY MOUTH DAILY. TAKE ON AN EMPTY STOMACH, 1 HOUR BEFORE OR 2 HOURS AFTER MEALS.   No facility-administered encounter medications on file as of 04/09/2018.      Patient Active Problem List   Diagnosis Date Noted  . Cavitary lesion of lung 03/29/2018  . Aortic atherosclerosis ([place]) 01/22/2018  . Emphysema lung ([place]) 01/22/2018  . CAP (community acquired pneumonia) 11/23/2017  . Metastasis to brain ([place]) 08/04/2017  . Brain metastasis ([place]) 07/27/2017  . Nicotine abuse  07/27/2017  . Migraines 07/27/2017  . HCAP (healthcare-associated pneumonia) 11/26/2016  . Upper airway cough syndrome 08/10/2016  . Abnormal echocardiogram 08/07/2013  . Chest tightness or pressure 08/07/2013  . Hx of radiation therapy   . Edema of left lower extremity 11/19/2012  . Tachycardia 09/20/2012  . Breast cancer of  upper-inner quadrant of left female breast ([place]) 06/08/2012  . Obstructive bronchiectasis ([place]) 01/26/2011  . Cigarette smoker 01/26/2011   Social History   Tobacco Use  . Smoking status: Current Every Day Smoker    Packs/day: 0.75    Years: 24.00    Pack years: 18.00    Types: Cigarettes  . Smokeless tobacco: Never Used  Substance Use Topics  . Alcohol use: No  . Drug use: No  family history includes Asthma in her brother; Breast cancer in her cousin; Breast cancer (age of onset: 9) in her mother; Emphysema in her mother; Heart disease in her mother; Melanoma in her mother.  Health Maintenance Due  Topic Date Due  . TETANUS/TDAP  01/18/1987  . PAP SMEAR  11/20/2016  . COLONOSCOPY  01/17/2018  . INFLUENZA VACCINE  02/08/2018     Review of Systems Review of Systems  Constitutional: Negative for fever, chills, diaphoresis, activity change, appetite change, fatigue and unexpected weight change.  HENT: Negative for congestion, sore throat, rhinorrhea, sneezing, trouble swallowing and sinus pressure.  Eyes: Negative for photophobia and visual disturbance.  Respiratory: positive for cough, chest tightness, shortness of breath, wheezing and stridor.  Cardiovascular: Negative for chest pain, palpitations and leg swelling.  Gastrointestinal: Negative for nausea, vomiting, abdominal pain, diarrhea, constipation, blood in stool, abdominal distention and anal bleeding.  Genitourinary: Negative for dysuria, hematuria, flank pain and difficulty urinating.  Musculoskeletal: Negative for myalgias, back pain, joint swelling, arthralgias and gait problem.  Skin: Negative for color change, pallor, rash and wound.  Neurological: Negative for dizziness, tremors, weakness and light-headedness.  Hematological: Negative for adenopathy. Does not bruise/bleed easily.  Psychiatric/Behavioral: Negative for behavioral problems, confusion, sleep disturbance, dysphoric mood, decreased concentration and  agitation.    Physical Exam   BP 98/65   Pulse 91   Temp 98.3 F (36.8 C)   Ht 5\' 2"  (1.575 m)   Wt 110 lb (49.9 kg)   LMP 07/25/2012 Comment: pregnancy waiver form signed 01-18-2018  BMI 20.12 kg/m   Physical Exam  Constitutional:  oriented to person, place, and time. appears well-developed and well-nourished. No distress.  HENT: [place]/AT, PERRLA, no scleral icterus Mouth/Throat: Oropharynx is clear and moist. No oropharyngeal exudate.  Cardiovascular: Normal rate, regular rhythm and normal heart sounds. Exam reveals no gallop and no friction rub.  No murmur heard.  Pulmonary/Chest: Effort normal and breath sounds normal. No respiratory distress. Rhonchi at base Neck = supple, no nuchal rigidity Abdominal: Soft. Bowel sounds are normal.  exhibits no distension. There is no tenderness.  Lymphadenopathy: no cervical adenopathy. No axillary adenopathy Neurological: alert and oriented to person, place, and time.  Skin: Skin is warm and dry. No rash noted. No erythema.  Psychiatric: a normal mood and affect.  behavior is normal.   CBC Lab Results  Component Value Date   WBC 11.1 (H) 03/26/2018   RBC 3.77 03/26/2018   HGB 11.2 (L) 03/26/2018   HCT 34.1 (L) 03/26/2018   PLT 378 03/26/2018   MCV 90.5 03/26/2018   MCH 29.7 03/26/2018   MCHC 32.8 03/26/2018   RDW 15.6 (H) 03/26/2018   LYMPHSABS 2.6 03/26/2018   MONOABS 0.7 03/26/2018  EOSABS 2.4 (H) 03/26/2018    BMET Lab Results  Component Value Date   NA 141 03/26/2018   K 4.3 03/26/2018   CL 105 03/26/2018   CO2 27 03/26/2018   GLUCOSE 90 03/26/2018   BUN 8 03/26/2018   CREATININE 0.88 03/26/2018   CALCIUM 9.9 03/26/2018   GFRNONAA >60 03/26/2018   GFRAA >60 03/26/2018      Assessment and Plan  Cavitary lung infection = would like to get sputum cultures for AFB, concern for NTM, but will also check serology and antigen testing for endemic mold (blasto, histo, cocci). unclear if this could be recurrence of cocci.  She had lobectomy plus extended antifungal at hte time that was thought to be curative, plus she moved out of the area shortly thereafter. Can have recurrence with myelosuppression, but unusual with her targeted drugs for metastatic disease  Discuss with wert if can do bronch  For now will collect samples. Follow up with dr wert who she sees on Wednesday about possibly of bronch vs. Empiric trial of antifungals

## 2018-04-10 MED FILL — TYKERB 250 MG TABLET: 250 | 30 days supply | Qty: 120 | Fill #1

## 2018-04-11 ENCOUNTER — Ambulatory Visit (INDEPENDENT_AMBULATORY_CARE_PROVIDER_SITE_OTHER)
Admission: RE | Admit: 2018-04-11 | Discharge: 2018-04-11 | Disposition: A | Discharge status: Home / Self Care | Payer: 59 | Source: Ambulatory Visit | Attending: Internal Medicine | Admitting: Internal Medicine

## 2018-04-11 ENCOUNTER — Other Ambulatory Visit (INDEPENDENT_AMBULATORY_CARE_PROVIDER_SITE_OTHER): Payer: 59

## 2018-04-11 ENCOUNTER — Encounter: Payer: Self-pay | Admitting: Internal Medicine

## 2018-04-11 ENCOUNTER — Ambulatory Visit: Payer: 59 | Admitting: Internal Medicine

## 2018-04-11 VITALS — BP 128/80 | HR 89 | Ht 62.0 in | Wt 110.6 lb

## 2018-04-11 DIAGNOSIS — J984 Other disorders of lung: Secondary | ICD-10-CM

## 2018-04-11 DIAGNOSIS — J479 Bronchiectasis, uncomplicated: Secondary | ICD-10-CM | POA: Diagnosis not present

## 2018-04-11 LAB — CBC WITH DIFFERENTIAL/PLATELET
BASOS ABS: 0.1 10*3/uL (ref 0.0–0.1)
Basophils Relative: 0.9 % (ref 0.0–3.0)
EOS ABS: 1.3 10*3/uL — AB (ref 0.0–0.7)
Eosinophils Relative: 12.3 % — ABNORMAL HIGH (ref 0.0–5.0)
HEMATOCRIT: 32.8 % — AB (ref 36.0–46.0)
Hemoglobin: 10.9 g/dL — ABNORMAL LOW (ref 12.0–15.0)
LYMPHS PCT: 24.9 % (ref 12.0–46.0)
Lymphs Abs: 2.6 10*3/uL (ref 0.7–4.0)
MCHC: 33.2 g/dL (ref 30.0–36.0)
MCV: 88.6 fl (ref 78.0–100.0)
MONOS PCT: 6.8 % (ref 3.0–12.0)
Monocytes Absolute: 0.7 10*3/uL (ref 0.1–1.0)
Neutro Abs: 5.8 10*3/uL (ref 1.4–7.7)
Neutrophils Relative %: 55.1 % (ref 43.0–77.0)
PLATELETS: 458 10*3/uL — AB (ref 150.0–400.0)
RBC: 3.71 Mil/uL — AB (ref 3.87–5.11)
RDW: 15.5 % (ref 11.5–15.5)
WBC: 10.5 10*3/uL (ref 4.0–10.5)

## 2018-04-11 LAB — SEDIMENTATION RATE: SED RATE: 130 mm/h — AB (ref 0–30)

## 2018-04-11 NOTE — Progress Notes (Signed)
Subjective:     Patient ID: Melissa [name], female   DOB: 1966-05-01 .   MRN: [id number]    Brief patient profile:  34  yowf active smoker dx'd with [place] fever in [place] requiring [place] in 1997 and 3 years of antifungal rx with fluconazole with residual sob heavy doe and sev times a year pain in L shoulder and lower back up to couple times a year lasts up  to a couple  of weeks maybe better with abx, maybe some worse joint pains,  Sometimes some sweats, occ with nasty mucus but no blood referred to pulmonary clinic for recurrent cp 01/2011 with documented Bronchiectasis in 2012    History of Present Illness  01/26/2011 Initial pulmonary office eval cc recurrent pleuritic L ant chest  pain, more severe than usual episodes,  rad neck,  Onset x 2 weeks with abrupt onset,  no better on lodine> stopped.   No sweats or nasty mucus now. Some better at day of ov p rx = dex and zithromax but  some sorethroat since dex with overt hb. rec ct chest POS BRONCHIECTASIS    Nov 2013 Breast Ca > lumpectomy with pos node> RT and chemo Finished Jan 2015     07/15/2016 acute extended ov/Wert re: ? Bronchiectasis /dulera 100 2 bid maint / still smoking Chief Complaint  Patient presents with  . Acute Visit    lower right back pain x 1 wk- radiating to her right rib area over the past 2 days. She feels discomfort when she takes a deep breath.  She also c/o episodes of feeling light headed. She is not having any SOB and no more cough than usual.    acute onset one week prior to OV  Very Similar pattern/characteristics to prev flares of bronchiectasis related cp on L side but this CP is located R post rad anteriorly / worse with breathing/ worse supine/ moderately severe No sob or need for rescue saba / some better with tylenol  rec levaquin 750 mg one daily x 5 days  For cough / congestion > mucinex dm up to 1200 every 12 hours as needed  Plan A = Automatic = Symbicort 80 1-2 every 12 hours  Work on  maintaining perfect  inhaler technique:  Plan B = Backup Only use your albuterol as a rescue medication CTa  07/19/16 neg PE or explanation for R cp c/w mscp from cough     11/25/2016  f/u ov/Wert re: f/u apparent pna LLL in pt with known bronchiectasis/ cystic lesion LLL  Chief Complaint  Patient presents with  . Follow-up    Pt states dxed with PNA x 3 wks ago.  She states she was told a few days ago that her CXR showed a lung mass and told her that she needed a CT Chest. She states having right side pain and minimal SOB.    cough and cp improved from last ov/ no excess/ purulent sputum or mucus plugs then mid April 2018 abrupt onset  nasal stuffiness and more chest congestion more yellowish then green/chills >  seen in [place]  With dx "L sided pna" rx x levaquin x 10 days > green mucus gone /chills gone >  Back to baseline level of chronic mscp R > L worse  Only with cough and back to nl ex tol   rec The key is to stop smoking completely before smoking completely stops you!      02/27/2017  f/u ov/Wert re:  Bronchiectasis with nl pfts on sybm 80 2bid / still smoking  Chief Complaint  Patient presents with  . Follow-up    Breathing is doing well. No new co's today.   cps all gone and Not limited by breathing from desired activities   No need for saba  rec Stop smoking    06/16/2017  f/u ov/Wert re:  Bronchiectasis s/p valley fever/ symb 80 2bid  Chief Complaint  Patient presents with  . Follow-up    Breathing is overall doing well. She states she has been having some HA and was dxed with sinus infection a few days ago- taking levaquin.    Ha  frontal and bilateral and worse in am and better on levquin/medrol  rec  Call if not better after you finish your antibiotics The key is to stop smoking completely before smoking completely stops you!   Brain surgery Jan 2019 then RT Feb 2019 finished taking dex around late Feb    11/22/2017 acute extended ov/Wert re:  Bronchiectasis /  symb 28 2bid  Chief Complaint  Patient presents with  . Acute Visit    Having trouble breathing productive cough mucus yellow in color. sharp pain in back.  around early April 2019  nasal congestion / cough slt yellow thick mucus  rx sudaphed seemed some better Then abruptly 2 d prior to [place] chills/ L post cp similar location as prior episodes of bronchiectasis and more sob with activity but not at rest though feels can't take a deep comfortable breath -   Cp better aleve and heating pad. Having to use"rescue" again sev times day and occ noct  (confused between dulera and saba though)  rec Plan A = Automatic = Symbicort or dulera  One twice daily of either one  Work on inhaler technique:  Plan B = Backup Only use your albuterol (ventolin) as a rescue medication Augmentin 875 mg take one pill twice daily  X 10 days -  Please remember to go to the  x-ray department downstairs in the basement  for your tests - we will call you with the results when they are available.    02/28/2018 acute extended ov/Wert re: cough/ still actively smoking  Chief Complaint  Patient presents with  . Acute Visit    c/o chest congestion, sinus congestion, sob, headaches Xapprox 1 week. Was dx'ed by UC with PNA, not improving on abx.   doing Fine p last ov then abruptly worse  02/22/18 with st, stuffy head and chest with clear mucus, aching all ever, chill temp 100 and increased sob/ need for saba but only once or twice a week Rx with levaquin 02/22/18 but no better and has 2 days left and using saba up to 4 x daily but neb only once, helped some. Doe = MMRC3 = can't walk 100 yards even at a slow pace at a flat grade s stopping due to sob  - severe cough disrupting sleep and wants that yellow cough med that has codeine (? Tussionex/)    Has same somewhat pleuritic discomfort L post chest she always gets with bronchiectasis flares, no better with levaquin  rec Ok to increase the symbicort 160 to Take 2 puffs first  thing in am and then another 2 puffs about 12 hours later.  Work on inhaler technique:  relax and gently blow all the way out then take a nice smooth deep breath back in, triggering the inhaler at same time you start breathing in.  Hold for up to 5 seconds if you can. Blow out thru nose. Rinse and gargle with water when done Augmentin 875 mg take one pill twice daily  X 10 days - take at breakfast and supper with large glass of water.  It would help reduce the usual side effects (diarrhea and yeast infections) if you ate cultured yogurt at lunch.  Prednisone 10 mg take  4 each am x 2 days,   2 each am x 2 days,  1 each am x 2 days and stop  For cough > tylenol # 3 one every 4 and flutter valve and mucinex 1200 mg every 12 hours as needed  The key is to stop smoking completely before smoking completely stops you!  Please remember to go to the  x-ray department downstairs in the basement  for your tests - we will call you with the results when they are available. - needs alpha one screen on return     03/27/2018  f/u ov/Wert re: pulmonary infiltrates s/p completion of RT to L chest 2014 / main on tykerb Chief Complaint  Patient presents with  . Follow-up    Pt had CT Chest done 03/22/18.  She states her breathing has been worse since her last appt here. She has been coughing more over the past 2-3 days- prod with pale yellow sputum. She has been using her albuterol inhaler 2 x daily on average.   Dyspnea:  More sob than usual since  new different L axillary pleuritic cp  acute one day prior to OV   Cough: minimal pale yellow mucus  Sleeping: freq awakening  SABA use: 2 x daily  02: none No more than one Tylenol #3 per day   04/11/2018  f/u ov/Wert re: f/u infected bleb  Chief Complaint  Patient presents with  . Follow-up    shortness of breath, productive cough, headahce.    Dyspnea:  Outside walking / ok shopping  Cough: better/ minimal yellow no blood Sleeping: no resp c/o L side down  flat bed 2 pillows  SABA use: only if goes out in heat 02: none    Minimal L ax pain with really deep breath or hard cough  "90% improved from last ov"   No obvious day to day or daytime variability or assoc excess  sputum or mucus plugs or hemoptysis or chest tightness, subjective wheeze or overt sinus or hb symptoms.   sleeping as above  without nocturnal  or early am exacerbation  of respiratory  c/o's or need for noct saba. Also denies any obvious fluctuation of symptoms with weather or environmental changes or other aggravating or alleviating factors except as outlined above   No unusual exposure hx or h/o childhood pna/ asthma or knowledge of premature birth.  Current Allergies, Complete Past Medical History, Past Surgical History, Family History, and Social History were reviewed in [employer] record.  ROS  The following are not active complaints unless bolded Hoarseness, sore throat, dysphagia, dental problems, itching, sneezing,  nasal congestion or discharge of excess mucus or purulent secretions, ear ache,   fever, chills, sweats, unintended wt loss or wt gain, classically  exertional cp,  orthopnea pnd or arm/hand swelling  or leg swelling, presyncope, palpitations, abdominal pain, anorexia, nausea, vomiting, diarrhea  or change in bowel habits or change in bladder habits, change in stools or change in urine, dysuria, hematuria,  rash, arthralgias, visual complaints, headache, numbness, weakness or ataxia or problems with walking or  coordination,  change in mood or  memory.        Current Meds  Medication Sig  . acetaminophen-codeine (TYLENOL #3) 300-30 MG tablet Take 1 tablet by mouth every 4 (four) hours as needed (cough).  Marland Kitchen albuterol (VENTOLIN HFA) 108 (90 BASE) MCG/ACT inhaler Inhale 2 puffs into the lungs every 6 (six) hours as needed.  . budesonide-formoterol (SYMBICORT) 80-4.5 MCG/ACT inhaler Inhale 2 puffs into the lungs 2 (two) times daily.  Marland Kitchen  letrozole (FEMARA) 2.5 MG tablet Take 1 tablet (2.5 mg total) by mouth daily.  . naproxen sodium (ALEVE) 220 MG tablet Take 440 mg by mouth 2 (two) times daily as needed (headache).   . TYKERB 250 MG tablet TAKE 4 TABLETS (1,000 MG TOTAL) BY MOUTH DAILY. TAKE ON AN EMPTY STOMACH, 1 HOUR BEFORE OR 2 HOURS AFTER MEALS.                   Objective:   Physical Exam  amb wf nad  Vital signs reviewed - Note on arrival 02 sats  97% on  RA      Wt 118  02/10/11 >  05/03/2011  118 > 117 03/23/2012 >  122 07/30/2012 > 09/29/2014 121> 06/13/2016   119 > 07/15/2016 118 > 08/09/2016  118  > 11/25/2016   114 > 02/27/2017   116 > 06/16/2017  119 >  11/22/2017 112 >  02/28/2018 110 > 03/27/2018   108 > 04/11/2018  110      HEENT: nl dentition, turbinates bilaterally, and oropharynx. Nl external ear canals without cough reflex   NECK :  without JVD/Nodes/TM/ nl carotid upstrokes bilaterally   LUNGS: no acc muscle use,  Nl contour chest with a few rhonchi on L and ? Amphoric change  L post upper chest  without cough on insp or exp maneuvers   CV:  RRR  no s3 or murmur or increase in P2, and no edema   ABD:  soft and nontender with nl inspiratory excursion in the supine position. No bruits or organomegaly appreciated, bowel sounds nl  MS:  Nl gait/ ext warm without deformities, calf tenderness, cyanosis or clubbing No obvious joint restrictions   SKIN: warm and dry without lesions    NEURO:  alert, approp, nl sensorium with  no motor or cerebellar deficits apparent.        CXR PA and Lateral:   04/11/2018 :    I personally reviewed images and  impression as follows:    slt decreased density, more of a cystic area now      Lab Results  Component Value Date   ESRSEDRATE 130 (H) 04/11/2018   ESRSEDRATE 115 (H) 03/27/2018   ESRSEDRATE 84 (H) 01/26/2011     Lab Results  Component Value Date   WBC 10.5 04/11/2018   HGB 10.9 (L) 04/11/2018   HCT 32.8 (L) 04/11/2018   MCV 88.6 04/11/2018   PLT  458.0 (H) 04/11/2018       EOS                                                              1.4  04/11/2018       Assessment:

## 2018-04-11 NOTE — Patient Instructions (Addendum)
Please remember to go to the lab and x-ray department downstairs in the basement  for your tests - we will call you with the results when they are available.    The key is to stop smoking completely before smoking completely stops you!  Add:  augmentin x 10 more days then cxr / ov in 2 weeks

## 2018-04-12 ENCOUNTER — Telehealth: Payer: Self-pay | Admitting: Internal Medicine

## 2018-04-12 ENCOUNTER — Encounter: Payer: Self-pay | Admitting: Internal Medicine

## 2018-04-12 DIAGNOSIS — R05 Cough: Secondary | ICD-10-CM

## 2018-04-12 DIAGNOSIS — R059 Cough, unspecified: Secondary | ICD-10-CM

## 2018-04-12 LAB — FUNGITELL (1-3)-B-D-GLUCAN
(1-3)-B-D-Glucan: 56 pg/mL (ref <60)
INTERPRETATION: NEGATIVE

## 2018-04-12 LAB — HISTOPLASMA ANTIGEN, URINE

## 2018-04-12 LAB — MVISTA BLASTOMYCES QNT AG, URINE
Interpretation: NEGATIVE
Result (ng/ml): NOT DETECTED ng/mL

## 2018-04-12 MED ORDER — AMOXICILLIN-POT CLAVULANATE 875-125 MG PO TABS: 1.0000 | ORAL_TABLET | Freq: Two times a day (BID) | ORAL | 0 refills | Status: DC

## 2018-04-12 NOTE — Progress Notes (Signed)
LMTCB

## 2018-04-12 NOTE — Telephone Encounter (Signed)
Notes recorded by Georjean Mode, CMA on 04/12/2018 at 5:06 PM EDT Attempted to call patient today regarding results. I did not receive an answer at time of call. I have left a voicemail message forptto return call. X2 ------  Notes recorded by Rosana Berger, CMA on 04/12/2018 at 3:48 PM EDT LMTCB ------  Notes recorded by Tanda Rockers, MD on 04/12/2018 at 8:24 AM EDT Call pt: Reviewed cxr and does show improvement so rx with another 10 days augmentin as per Lab Result note

## 2018-04-12 NOTE — Telephone Encounter (Signed)
Spoke with pt letting her know the results of both the labwork and cxr. Pt expressed understanding.  Scheduled pt f/u appt with Dr. Melvyn Novas in two weeks and also placed order for cxr as well as sent the augmentin to pt's pharmacy for her.  Nothing further needed.

## 2018-04-12 NOTE — Assessment & Plan Note (Signed)
-   CT chest   01/26/2011 Status post left upper lobectomy.  Focal bronchiectasis / scarring medially in the left lung apex with  surrounding inflammatory changes  - Pneumovax 08/12/11  And prevnar 08/09/2016  - 07/15/2016  After extensive coaching HFA effectiveness =    75% symbicort 80 2bid  - PFT's  08/09/2016  FEV1 2.05 (77 % ) ratio 70  p 5 % improvement from saba p dulera 100 x2  prior to study with DLCO  46 % corrects to 55  % for alv volume   - alpha one screening 03/27/2018  MM 267  rec continue symbicort/ mucinex / stop smoking     I had an extended discussion with the patient reviewing all relevant studies completed to date and  lasting 15 to 20 minutes of a 25 minute visit    Each maintenance medication was reviewed in detail including most importantly the difference between maintenance and prns and under what circumstances the prns are to be triggered using an action plan format that is not reflected in the computer generated alphabetically organized AVS.     Please see AVS for specific instructions unique to this visit that I personally wrote and verbalized to the the pt in detail and then reviewed with pt  by my nurse highlighting any  changes in therapy recommended at today's visit to their plan of care.

## 2018-04-12 NOTE — Assessment & Plan Note (Signed)
New since CT 01/18/18 on  chest 03/22/18 assoc with acute onset L axillary cp 03/28/18 in area of previous blebs s a/f level c/w infected bleb rx augmentin x 10 days > improved at ov 04/11/2018  - Quant TB 03/27/2018 neg  - ID eval 04/09/18 fungal serologies sent  - 04/11/2018 rec another 10 days augmentin for ? Abscess forming LUL   Response so fare typical of pyogenic infection - unfortunately if she is developing lung abscess will need to take abx a lot longer due to poor penetration of abx into what is probably not really viable lung tissue but bx/consideration of any drainage procedure are out of the question for now due to risk of ptx/ creating bp fistula or empyema and she's clinically much better considering   Discussed in detail all the  indications, usual  risks and alternatives  relative to the benefits with patient who agrees to proceed with cxr as outlined  With f/u in 2 more weeks

## 2018-04-14 LAB — BLASTOMYCES AB, ID: BLASTOMYCES ABS, QN, DID: NEGATIVE

## 2018-04-14 LAB — HISTOPLASMA ANTIBODIES: HISTOPLASMA AB ID: NEGATIVE

## 2018-04-14 LAB — COCCIDIOIDES ANTIBODIES

## 2018-04-17 ENCOUNTER — Telehealth: Payer: Self-pay | Admitting: *Deleted

## 2018-04-17 NOTE — Telephone Encounter (Signed)
Patient called to see if her lab results were back, asking for the next step.  Please advise. Landis Gandy, RN

## 2018-04-19 DIAGNOSIS — J984 Other disorders of lung: Secondary | ICD-10-CM

## 2018-04-20 ENCOUNTER — Ambulatory Visit: Payer: 59 | Admitting: Primary Care

## 2018-04-20 ENCOUNTER — Ambulatory Visit (INDEPENDENT_AMBULATORY_CARE_PROVIDER_SITE_OTHER)
Admission: RE | Admit: 2018-04-20 | Discharge: 2018-04-20 | Disposition: A | Discharge status: Home / Self Care | Payer: 59 | Source: Ambulatory Visit | Attending: Primary Care | Admitting: Primary Care

## 2018-04-20 ENCOUNTER — Encounter: Payer: Self-pay | Admitting: Primary Care

## 2018-04-20 ENCOUNTER — Telehealth: Payer: Self-pay | Admitting: Primary Care

## 2018-04-20 VITALS — BP 110/68 | HR 91 | Temp 98.5°F | Ht 62.0 in | Wt 110.2 lb

## 2018-04-20 DIAGNOSIS — R05 Cough: Secondary | ICD-10-CM | POA: Diagnosis not present

## 2018-04-20 DIAGNOSIS — R197 Diarrhea, unspecified: Secondary | ICD-10-CM

## 2018-04-20 DIAGNOSIS — J984 Other disorders of lung: Secondary | ICD-10-CM

## 2018-04-20 DIAGNOSIS — R0781 Pleurodynia: Secondary | ICD-10-CM | POA: Diagnosis not present

## 2018-04-20 DIAGNOSIS — R059 Cough, unspecified: Secondary | ICD-10-CM

## 2018-04-20 MED ORDER — LEVALBUTEROL HCL 0.63 MG/3ML IN NEBU
0.6300 mg | INHALATION_SOLUTION | RESPIRATORY_TRACT | Status: AC
  Administered 2018-04-20: 0.63 mg via RESPIRATORY_TRACT

## 2018-04-20 MED ORDER — LEVALBUTEROL HCL 0.63 MG/3ML IN NEBU: 0.6300 mg | INHALATION_SOLUTION | RESPIRATORY_TRACT | 6 refills | Status: DC | PRN

## 2018-04-20 NOTE — Progress Notes (Signed)
@Patient  ID: Melissa [name], female    DOB: Jun 22, 1966, 52 y.o.   MRN: [id number]  Chief Complaint  Patient presents with  . Acute Visit    cough with yellow mucus, chest tightness and wheezing, SOB with exertion    Referring provider: No ref. provider found  HPI: 52 year old female, current smoker. PMH pneumonia, emphysema, obstructive bronchiectasis. Patient of Dr. Melvyn Novas, last seen on 04/12/18. Currently being treated for new cavitary lung lesion with extended course of Augmentin, ? abscess forming LUL. CXR showed cavitary airspace opacities in the left upper lobe slightly less prominent than prior study. Bronchiectasis in the left lower lobe, stable. No acute cardiopulmonary disease. She has a 2 week follow up scheduled with Dr. Melvyn Novas on 10/23.   Dx'd with [place] fever in [place] requiring LUL lobectomy in 1997 and 3 years of antifungal rx with fluconazole with residual sob heavy doe and sev times a year pain in L shoulder and lower back up to couple times a year lasts up  to a couple  of weeks maybe better with abx, maybe some worse joint pains,  Sometimes some sweats, occ with nasty mucus but no blood referred to pulmonary clinic for recurrent cp 01/2011 with documented Bronchiectasis in 2012   04/22/2018 Patient presents today with chest pain and flu like symptoms. Complains of sob on exertion since May but more in the past 3 days. Associated chest tightness, wheezing and pleuritic left sided chest pain. Described chest pain as sore, more painful. Taking tylenol #3 at night helps her sleep. She doesn't think Augmentin is doing anything. Currently on second round of abx. Feels more congested and coughing more. Continues Symbicort as prescribed. Has not required rescue inhaler that often. Following with Dr. Graylon Good with [facility], states that she was told if she was not feeling better they would consider starting her on Fluconazole. She requested their office send her medical records to her doctor in  [place] who previously treated her [place] fever in 1997.  Has had occasional loose stool and nausea. Quantiferon Gold is negative. Afebrile.     Allergies  Allergen Reactions  . Aspirin Anaphylaxis and Shortness Of Breath    THROAT CLOSES  . Protonix [Pantoprazole] Nausea Only and Other (See Comments)    Also caused a "film in the mouth" and caused chest pressure  . Iodinated Diagnostic Agents Rash    Immunization History  Administered Date(s) Administered  . Influenza Split 05/03/2011  . Influenza Whole 03/11/2012  . Influenza,inj,Quad PF,6+ Mos 07/29/2017  . Pneumococcal Conjugate-13 08/09/2016  . Pneumococcal-Unspecified 08/12/2011    Past Medical History:  Diagnosis Date  . Anemia   . Arthritis    knees and hips  . Asthma   . Breast cancer ([place])   . Bronchiolitis   . Cancer [facility])    breast cancer 2014  . Complication of anesthesia    bp dropped + desat   . COPD (chronic obstructive pulmonary disease) ([place])   . GERD (gastroesophageal reflux disease)   . H/O coccidioidomycosis    was reason for lung lobectomy  . Headache(784.0)    due to eye strain or not eating  . History of anemia    no current problem  . History of asthma    as a child  . History of breast cancer 2014   left  . History of chemotherapy    finished 07/17/2013  . History of hiatal hernia   . Hx of radiation therapy 03/25/13-05/06/13  left breast 5000 cGy/25 sessions, left breast boost 1000 cGy/5 sessions  . Runny nose 07/30/2013   clear drainage  . Wears dentures    upper    Tobacco History: Social History   Tobacco Use  Smoking Status Current Every Day Smoker  . Packs/day: 0.75  . Years: 24.00  . Pack years: 18.00  . Types: Cigarettes  Smokeless Tobacco Never Used   Ready to quit: Not Answered Counseling given: Not Answered   Outpatient Medications Prior to Visit  Medication Sig Dispense Refill  . acetaminophen-codeine (TYLENOL #3) 300-30 MG tablet Take 1 tablet by mouth every  4 (four) hours as needed (cough). 30 tablet 0  . albuterol (VENTOLIN HFA) 108 (90 BASE) MCG/ACT inhaler Inhale 2 puffs into the lungs every 6 (six) hours as needed. 1 Inhaler 3  . amoxicillin-clavulanate (AUGMENTIN) 875-125 MG tablet Take 1 tablet by mouth 2 (two) times daily. 20 tablet 0  . budesonide-formoterol (SYMBICORT) 80-4.5 MCG/ACT inhaler Inhale 2 puffs into the lungs 2 (two) times daily. 1 Inhaler 5  . letrozole (FEMARA) 2.5 MG tablet Take 1 tablet (2.5 mg total) by mouth daily. 90 tablet 3  . naproxen sodium (ALEVE) 220 MG tablet Take 440 mg by mouth 2 (two) times daily as needed (headache).     . TYKERB 250 MG tablet TAKE 4 TABLETS (1,000 MG TOTAL) BY MOUTH DAILY. TAKE ON AN EMPTY STOMACH, 1 HOUR BEFORE OR 2 HOURS AFTER MEALS. 150 tablet 3   No facility-administered medications prior to visit.     Review of Systems  Review of Systems  Constitutional: Negative.   HENT: Negative.   Respiratory: Positive for shortness of breath and wheezing.        Left pleuritic chest pain  Cardiovascular: Negative.   Gastrointestinal: Positive for diarrhea and nausea. Negative for abdominal pain, blood in stool and vomiting.  Skin: Negative.     Physical Exam  BP 110/68 (BP Location: Right Arm, Cuff Size: Normal)   Pulse 91   Temp 98.5 F (36.9 C)   Ht 5\' 2"  (1.575 m)   Wt 110 lb 3.2 oz (50 kg)   LMP 07/25/2012 Comment: pregnancy waiver form signed 01-18-2018  SpO2 99%   BMI 20.16 kg/m  Physical Exam  Constitutional: She is oriented to person, place, and time. She appears well-developed and well-nourished. No distress.  HENT:  Head: Normocephalic and atraumatic.  Discolored spots to inside oral mucosal lining (not new per pt)  Eyes: Pupils are equal, round, and reactive to light. EOM are normal.  Neck: Normal range of motion. Neck supple.  Cardiovascular: Normal rate.  Pulmonary/Chest: Effort normal. She has wheezes.  Coarse lung sounds left upper lobe. Faint insp/exp wheeze. No  resp distress or accessory use.   Musculoskeletal: Normal range of motion.  Neurological: She is alert and oriented to person, place, and time.  Psychiatric: She has a normal mood and affect. Her behavior is normal. Judgment and thought content normal.     Lab Results:  CBC    Component Value Date/Time   WBC 10.5 04/11/2018 1706   RBC 3.71 (L) 04/11/2018 1706   HGB 10.9 (L) 04/11/2018 1706   HGB 11.2 (L) 03/26/2018 1507   HGB 13.1 08/12/2014 0955   HCT 32.8 (L) 04/11/2018 1706   HCT 40.4 08/12/2014 0955   PLT 458.0 (H) 04/11/2018 1706   PLT 378 03/26/2018 1507   PLT 228 08/12/2014 0955   MCV 88.6 04/11/2018 1706   MCV 98.2 08/12/2014 0955  MCH 29.7 03/26/2018 1507   MCHC 33.2 04/11/2018 1706   RDW 15.5 04/11/2018 1706   RDW 13.0 08/12/2014 0955   LYMPHSABS 2.6 04/11/2018 1706   LYMPHSABS 2.0 08/12/2014 0955   MONOABS 0.7 04/11/2018 1706   MONOABS 0.5 08/12/2014 0955   EOSABS 1.3 (H) 04/11/2018 1706   EOSABS 0.2 08/12/2014 0955   BASOSABS 0.1 04/11/2018 1706   BASOSABS 0.0 08/12/2014 0955    BMET    Component Value Date/Time   NA 141 03/26/2018 1507   NA 142 08/12/2014 0956   K 4.3 03/26/2018 1507   K 4.6 08/12/2014 0956   CL 105 03/26/2018 1507   CL 100 12/20/2012 1509   CO2 27 03/26/2018 1507   CO2 26 08/12/2014 0956   GLUCOSE 90 03/26/2018 1507   GLUCOSE 94 08/12/2014 0956   GLUCOSE 98 12/20/2012 1509   BUN 8 03/26/2018 1507   BUN 10.1 08/12/2014 0956   CREATININE 0.88 03/26/2018 1507   CREATININE 0.9 08/12/2014 0956   CALCIUM 9.9 03/26/2018 1507   CALCIUM 9.3 08/12/2014 0956   GFRNONAA >60 03/26/2018 1507   GFRAA >60 03/26/2018 1507    BNP No results found for: BNP  ProBNP No results found for: PROBNP  Imaging: Dg Chest 2 View  Result Date: 04/20/2018 CLINICAL DATA:  Mucus, cough, wheezing and left side cp x 3 days. Hx of COPD, pna, LUL lobectomy. Smoker. EXAM: CHEST - 2 VIEW COMPARISON:  Chest x-ray dated 04/11/2018. Chest CT dated  03/22/2018. FINDINGS: Heart size and mediastinal contours are stable. RIGHT lung remains clear. There is a stable cavitary consolidation in the LEFT upper lobe, unchanged compared to the most recent chest x-ray and not appreciably changed in extent compared to the earlier chest CT. Stable lucencies noted at the LEFT lung base, compatible with the cylindrical and varicoid bronchiectasis demonstrated on earlier chest CT. No acute or suspicious osseous finding. IMPRESSION: 1. Stable chest x-ray. No evidence of new/developing pneumonia or pulmonary edema. 2. The cavitary consolidation/distortion in the LEFT upper lobe is stable in appearance when compared to most recent chest x-ray of 04/11/2018 and not appreciably changed in extent compared to the chest CT of 03/22/2018, suspected to represent sequela of radiation pneumonitis with possible superimposed infection on earlier chest CT report. Electronically Signed   By: Franki Cabot M.D.   On: 04/20/2018 15:49   Dg Chest 2 View  Result Date: 04/12/2018 CLINICAL DATA:  Cough EXAM: CHEST - 2 VIEW COMPARISON:  12/29/2017 FINDINGS: Postoperative changes on the left. Cavitary airspace opacity again noted in the left upper lobe, slightly less prominent than prior study. Bronchiectasis in the left lower lobe. Right lung clear. Heart is normal size. No effusions or acute bony abnormality. IMPRESSION: Cavitary airspace opacities in the left upper lobe slightly less prominent than prior study. Bronchiectasis in the left lower lobe, stable. No acute cardiopulmonary disease. Electronically Signed   By: Rolm Baptise M.D.   On: 04/12/2018 08:08     Assessment & Plan:   Emphysema lung (HCC) - Continue Symbicort 80 two puffs twice daily  - Use Albuterol rescue inhaler/nebulizer every 4-6 hours for sob/wheezing - DME referral for new nebulizer start   Cavitary lesion of lung - Increased left pleuretic pain and SOB  - Continues second course of Augmentin - Following with  ID, may be started on Fluconazole  - EKG normal.  - Encourage Aleve BID and Tylenol #3 at night - CXR stable, no evidence of PNA or pulmonary edema. Cavitary  consolidation/distortion in LUL is stable in appearance    Diarrhea - Flu swab negative - Stool culture if having loose stools - Enc probiotic/eating yogurt with abx      Martyn Ehrich, NP 04/22/2018

## 2018-04-20 NOTE — Telephone Encounter (Signed)
That was a recommendation from ID and needs to come from them unfortunately. Please let patient know her CXR was stable. No evidence of new/developing pneumonia or pulmonary edema. The consolidation/distortion in the LEFT upper lobe is stable in appearance when compared to most recent chest x-ray of 04/11/2018.

## 2018-04-20 NOTE — Patient Instructions (Addendum)
Shortness of breath/wheezing: - Continue Symbicort 80 two puffs twice daily  - Use Albuterol rescue inhaler/nebulizer every 4-6 hours for sob/wheezing - DME referral for new nebulizer start   Cough: - Delsym cough syrup twice daily  - Mucinex twice daily   Pain: Take Aleve twice daily for left side pain (with food)  Office treatment: - Xopenex neb x1  Testing: EKG- normal  FLU-  CXR today Stool culture if having loose stools  Recommend: - Call [facility], ? starting fluconazole   Follow-up: - Dr. Melvyn Novas on 10/23 as scheduled

## 2018-04-20 NOTE — Telephone Encounter (Signed)
Called and spoke with pt who is requesting an Rx of fluconazole.  Beth, please advise if it is okay to send Rx for pt. Thanks!

## 2018-04-20 NOTE — Telephone Encounter (Signed)
LMTCB

## 2018-04-22 ENCOUNTER — Encounter: Payer: Self-pay | Admitting: Primary Care

## 2018-04-22 DIAGNOSIS — R197 Diarrhea, unspecified: Secondary | ICD-10-CM | POA: Insufficient documentation

## 2018-04-22 LAB — POCT INFLUENZA A/B
Influenza A, POC: NEGATIVE
Influenza B, POC: NEGATIVE

## 2018-04-22 NOTE — Assessment & Plan Note (Signed)
-   Flu swab negative - Stool culture if having loose stools - Enc probiotic/eating yogurt with abx

## 2018-04-22 NOTE — Assessment & Plan Note (Signed)
-   Continue Symbicort 80 two puffs twice daily  - Use Albuterol rescue inhaler/nebulizer every 4-6 hours for sob/wheezing - DME referral for new nebulizer start

## 2018-04-22 NOTE — Assessment & Plan Note (Addendum)
-   Increased left pleuretic pain and SOB  - Continues second course of Augmentin - Following with [facility], may be started on Fluconazole  - EKG normal.  - Encourage Aleve BID and Tylenol #3 at night - CXR stable, no evidence of PNA or pulmonary edema. Cavitary consolidation/distortion in LUL is stable in appearance

## 2018-04-23 ENCOUNTER — Telehealth: Payer: Self-pay | Admitting: *Deleted

## 2018-04-23 DIAGNOSIS — J984 Other disorders of lung: Secondary | ICD-10-CM

## 2018-04-23 DIAGNOSIS — R911 Solitary pulmonary nodule: Secondary | ICD-10-CM

## 2018-04-23 NOTE — Progress Notes (Signed)
Chart and office note reviewed in detail  > agree with a/p as outlined    She had previously  stated her L axillary pain (which was new and correlated with the ct changes) was much better and has been bothered by chronic / recurrent cp's x years so best step is to repeat ct Chest for apples to apples comparison and consider vats bx for definitive dx if not improving (this area is apex of what is now the LLL superior segment and quite peripheral so doubt navigational bx would be definitive here)

## 2018-04-23 NOTE — Telephone Encounter (Signed)
-----   Message from Tanda Rockers, MD sent at 04/23/2018  6:11 AM EDT ----- Needs ct chest this week to decide re need for bx for cavitary mass L lung

## 2018-04-23 NOTE — Telephone Encounter (Signed)
Spoke with pt. She is aware of Beth's response. Nothing further was needed. 

## 2018-04-23 NOTE — Telephone Encounter (Signed)
Spoke with the pt and notified of recs per MW  She verbalized understanding  Order sent to [facility] 

## 2018-04-24 ENCOUNTER — Telehealth: Payer: Self-pay | Admitting: Internal Medicine

## 2018-04-24 MED ORDER — FLUCONAZOLE 200 MG PO TABS: 400.0000 mg | ORAL_TABLET | Freq: Every day | ORAL | 1 refills | Status: DC

## 2018-04-24 NOTE — Telephone Encounter (Signed)
What is the refill for? Yeast infection symptoms or for possible lung infection  Can you get me IDs last note please. Dr. Wilder Glade to review

## 2018-04-24 NOTE — Telephone Encounter (Signed)
Spoke with pt, she has a mass in her left lung and infectious disease  they do not know what it is. Dr. Karolee Ohs is supposed to be writing a Rx for fluconazole according to mychart message with pt. I advised pt that she would have to wait for Dr. Baxter Flattery to write the Rx because she is the doctor that was initiating the treatment. See mychart message for further details. Pt agreed to wait for Dr. Alvira Philips office to call her and stated she called them this morning already. FYI Beth

## 2018-04-24 NOTE — Telephone Encounter (Signed)
Spoke with pt. States that she is needing a refill on fluconazole. Per Beth's documentation, she should talk with [facility] about this. Pt states that she can't get a response back from them about this. She is requesting that our refill this medication this one time.  Beth - please advise. Thanks.

## 2018-04-26 ENCOUNTER — Telehealth: Payer: Self-pay | Admitting: Pharmacist

## 2018-04-26 ENCOUNTER — Telehealth: Payer: Self-pay

## 2018-04-26 ENCOUNTER — Ambulatory Visit (INDEPENDENT_AMBULATORY_CARE_PROVIDER_SITE_OTHER)
Admission: RE | Admit: 2018-04-26 | Discharge: 2018-04-26 | Disposition: A | Discharge status: Home / Self Care | Payer: 59 | Source: Ambulatory Visit | Attending: Internal Medicine | Admitting: Internal Medicine

## 2018-04-26 DIAGNOSIS — R911 Solitary pulmonary nodule: Secondary | ICD-10-CM

## 2018-04-26 DIAGNOSIS — J984 Other disorders of lung: Secondary | ICD-10-CM | POA: Diagnosis not present

## 2018-04-26 NOTE — Telephone Encounter (Signed)
[facility] pharmacy called today with concerns regarding prescription Fluconazole. Pharmacist states that the patient is currently taking Tykerb which has a interaction with Fluconazole. Pharmacy would like to know if it is okay to dispense medication still. Will route message to Dr. Baxter Flattery to advise. [place]

## 2018-04-26 NOTE — Telephone Encounter (Signed)
Oral Oncology Pharmacist Encounter  Received call from patient with questions about medication interaction between Tykerb (lapatinib) and Diflucan (fluconazole).  Patient currently on Tykerb 250 mg tablets, 4 tablets (1000 mg) by mouth once daily on an empty stomach started on 08/31/2017 for treatment of her metastatic, HER-2 positive breast cancer.  Patient has recently been prescribed fluconazole 200 mg tablets, take 2 tablets (400 mg) by mouth once daily by Dr. Baxter Flattery, infectious diseases doctor at [facility], for cavitary lung lesion.  Patient states that when she went to pick up her prescription from [facility] the pharmacist alerted her that if she was to take both medications together that she could possibly have a heart attack.  Medication reconciliation performed. Category C interaction between Tykerb and Diflucan identified. Diflucan is a moderate inhibitor of CYP 3 A4 leading to possible decreased metabolism and increase systemic exposure to patient's Tykerb.  Patient informed that both medications are safe to take concurrently. Patient will be monitored for increased side effects due to her Tykerb such as an increase in watery or loose stools or new skin toxicities that may occur on the hands or feet. Patient instructed to call the office if she starts to experience multiple daily episodes of diarrhea so that her electrolytes can be assessed. Patient with recent EKG performed 04/20/2018 which showed QTc interval at 450 msec  Patient states she is now comfortable starting on Diflucan. She knows to call the office if she starts to experience any new side effects once starting the antifungal.  All questions answered. Patient knows to call the office with any additional questions or concerns. We will see her at planned office visit on 05/28/2018, she will call before then if experiencing any issues.  Johny Drilling, PharmD, BCPS, BCOP  04/26/2018 10:48 AM Oral Oncology  Clinic [phone]

## 2018-04-27 ENCOUNTER — Other Ambulatory Visit: Payer: Self-pay | Admitting: Radiation Therapy

## 2018-04-27 DIAGNOSIS — C7949 Secondary malignant neoplasm of other parts of nervous system: Principal | ICD-10-CM

## 2018-04-27 DIAGNOSIS — C7931 Secondary malignant neoplasm of brain: Secondary | ICD-10-CM

## 2018-05-02 ENCOUNTER — Encounter: Payer: Self-pay | Admitting: Internal Medicine

## 2018-05-02 ENCOUNTER — Ambulatory Visit (INDEPENDENT_AMBULATORY_CARE_PROVIDER_SITE_OTHER)
Admission: RE | Admit: 2018-05-02 | Discharge: 2018-05-02 | Disposition: A | Discharge status: Home / Self Care | Payer: 59 | Source: Ambulatory Visit | Attending: Internal Medicine | Admitting: Internal Medicine

## 2018-05-02 ENCOUNTER — Ambulatory Visit (INDEPENDENT_AMBULATORY_CARE_PROVIDER_SITE_OTHER): Payer: 59 | Admitting: Internal Medicine

## 2018-05-02 VITALS — BP 104/60 | HR 91 | Ht 62.0 in | Wt 109.4 lb

## 2018-05-02 DIAGNOSIS — J984 Other disorders of lung: Secondary | ICD-10-CM | POA: Diagnosis not present

## 2018-05-02 DIAGNOSIS — J479 Bronchiectasis, uncomplicated: Secondary | ICD-10-CM

## 2018-05-02 DIAGNOSIS — R05 Cough: Secondary | ICD-10-CM

## 2018-05-02 DIAGNOSIS — R059 Cough, unspecified: Secondary | ICD-10-CM

## 2018-05-02 DIAGNOSIS — R0609 Other forms of dyspnea: Secondary | ICD-10-CM | POA: Diagnosis not present

## 2018-05-02 MED ORDER — ACETAMINOPHEN-CODEINE #3 300-30 MG PO TABS: 1.0000 | ORAL_TABLET | ORAL | 0 refills | Status: DC | PRN

## 2018-05-02 NOTE — Progress Notes (Addendum)
Subjective:     Patient ID: Melissa [name], female   DOB: 1966-05-01 .   MRN: [id number]    Brief patient profile:  34  yowf active smoker dx'd with [place] in [place] requiring [place] in 1997 and 3 years of antifungal rx with fluconazole with residual sob heavy doe and sev times a year pain in L shoulder and lower back up to couple times a year lasts up  to a couple  of weeks maybe better with abx, maybe some worse joint pains,  Sometimes some sweats, occ with nasty mucus but no blood referred to pulmonary clinic for recurrent cp 01/2011 with documented Bronchiectasis in 2012    History of Present Illness  01/26/2011 Initial pulmonary office eval cc recurrent pleuritic L ant chest  pain, more severe than usual episodes,  rad neck,  Onset x 2 weeks with abrupt onset,  no better on lodine> stopped.   No sweats or nasty mucus now. Some better at day of ov p rx = dex and zithromax but  some sorethroat since dex with overt hb. rec ct chest POS BRONCHIECTASIS    Nov 2013 Breast Ca > lumpectomy with pos node> RT and chemo Finished Jan 2015     07/15/2016 acute extended ov/Melissa [name] re: ? Bronchiectasis /dulera 100 2 bid maint / still smoking Chief Complaint  Patient presents with  . Acute Visit    lower right back pain x 1 wk- radiating to her right rib area over the past 2 days. She feels discomfort when she takes a deep breath.  She also c/o episodes of feeling light headed. She is not having any SOB and no more cough than usual.    acute onset one week prior to OV  Very Similar pattern/characteristics to prev flares of bronchiectasis related cp on L side but this CP is located R post rad anteriorly / worse with breathing/ worse supine/ moderately severe No sob or need for rescue saba / some better with tylenol  rec levaquin 750 mg one daily x 5 days  For cough / congestion > mucinex dm up to 1200 every 12 hours as needed  Plan A = Automatic = Symbicort 80 1-2 every 12 hours  Work on  maintaining perfect  inhaler technique:  Plan B = Backup Only use your albuterol as a rescue medication CTa  07/19/16 neg PE or explanation for R cp c/w mscp from cough     11/25/2016  f/u ov/Melissa [name] re: f/u apparent pna LLL in pt with known bronchiectasis/ cystic lesion LLL  Chief Complaint  Patient presents with  . Follow-up    Pt states dxed with PNA x 3 wks ago.  She states she was told a few days ago that her CXR showed a lung mass and told her that she needed a CT Chest. She states having right side pain and minimal SOB.    cough and cp improved from last ov/ no excess/ purulent sputum or mucus plugs then mid April 2018 abrupt onset  nasal stuffiness and more chest congestion more yellowish then green/chills >  seen in [place]  With dx "L sided pna" rx x levaquin x 10 days > green mucus gone /chills gone >  Back to baseline level of chronic mscp R > L worse  Only with cough and back to nl ex tol   rec The key is to stop smoking completely before smoking completely stops you!      02/27/2017  f/u ov/Melissa [name] re:  Bronchiectasis with nl pfts on sybm 80 2bid / still smoking  Chief Complaint  Patient presents with  . Follow-up    Breathing is doing well. No new co's today.   cps all gone and Not limited by breathing from desired activities   No need for saba  rec Stop smoking    06/16/2017  f/u ov/Melissa [name] re:  Bronchiectasis s/p valley fever/ symb 80 2bid  Chief Complaint  Patient presents with  . Follow-up    Breathing is overall doing well. She states she has been having some HA and was dxed with sinus infection a few days ago- taking levaquin.    Ha  frontal and bilateral and worse in am and better on levquin/medrol  rec  Call if not better after you finish your antibiotics The key is to stop smoking completely before smoking completely stops you!    Brain surgery Jan 2019 then RT Feb 2019 finished taking dex around late Feb 2019    11/22/2017 acute extended ov/Melissa [name] re:   Bronchiectasis / symb 1 2bid  Chief Complaint  Patient presents with  . Acute Visit    Having trouble breathing productive cough mucus yellow in color. sharp pain in back.  around early April 2019  nasal congestion / cough slt yellow thick mucus  rx sudaphed seemed some better Then abruptly 2 d prior to [place] chills/ L post cp similar location as prior episodes of bronchiectasis and more sob with activity but not at rest though feels can't take a deep comfortable breath -   Cp better aleve and heating pad. Having to use"rescue" again sev times day and occ noct  (confused between dulera and saba though)  rec Plan A = Automatic = Symbicort or dulera  One twice daily of either one  Work on inhaler technique:  Plan B = Backup Only use your albuterol (ventolin) as a rescue medication Augmentin 875 mg take one pill twice daily  X 10 days -  Please remember to go to the  x-ray department downstairs in the basement  for your tests - we will call you with the results when they are available.    02/28/2018 acute extended ov/Melissa [name] re: cough/ still actively smoking  Chief Complaint  Patient presents with  . Acute Visit    c/o chest congestion, sinus congestion, sob, headaches Xapprox 1 week. Was dx'ed by UC with PNA, not improving on abx.   doing Fine p last ov then abruptly worse  02/22/18 with st, stuffy head and chest with clear mucus, aching all ever, chill temp 100 and increased sob/ need for saba but only once or twice a week Rx with levaquin 02/22/18 but no better and has 2 days left and using saba up to 4 x daily but neb only once, helped some. Doe = MMRC3 = can't walk 100 yards even at a slow pace at a flat grade s stopping due to sob  - severe cough disrupting sleep and wants that yellow cough med that has codeine (? Tussionex/)    Has same somewhat pleuritic discomfort L post chest she always gets with bronchiectasis flares, no better with levaquin  rec Ok to increase the symbicort 160 to  Take 2 puffs first thing in am and then another 2 puffs about 12 hours later.  Work on inhaler technique:  relax and gently blow all the way out then take a nice smooth deep breath back in, triggering the inhaler at same time you start  breathing in.  Hold for up to 5 seconds if you can. Blow out thru nose. Rinse and gargle with water when done Augmentin 875 mg take one pill twice daily  X 10 days - take at breakfast and supper with large glass of water.  It would help reduce the usual side effects (diarrhea and yeast infections) if you ate cultured yogurt at lunch.  Prednisone 10 mg take  4 each am x 2 days,   2 each am x 2 days,  1 each am x 2 days and stop  For cough > tylenol # 3 one every 4 and flutter valve and mucinex 1200 mg every 12 hours as needed  The key is to stop smoking completely before smoking completely stops you!  Please remember to go to the  x-ray department downstairs in the basement  for your tests - we will call you with the results when they are available. - needs alpha one screen on return    03/23/18  augmentin x 7d days called in   03/27/2018  f/u ov/Melissa [name] re: pulmonary infiltrates s/p completion of RT to L chest 2014 / main on tykerb Chief Complaint  Patient presents with  . Follow-up    Pt had CT Chest done 03/22/18.  She states her breathing has been worse since her last appt here. She has been coughing more over the past 2-3 days- prod with pale yellow sputum. She has been using her albuterol inhaler 2 x daily on average.   Dyspnea:  More sob than usual since  new different L axillary pleuritic cp  acute one day prior to OV   Cough: minimal pale yellow mucus  Sleeping: freq awakening  SABA use: 2 x daily  02: none No more than one Tylenol #3 per daya rec augmentin x 10 days done      04/11/2018  f/u ov/Melissa [name] re: f/u infected bleb  Chief Complaint  Patient presents with  . Follow-up    shortness of breath, productive cough, headahce.    Dyspnea:  Outside  walking / ok shopping  Cough: better/ minimal yellow no blood Sleeping: no resp c/o L side down flat bed 2 pillows  SABA use: only if goes out in heat 02: none    Minimal L ax pain with really deep breath or hard cough  "90% improved from last ov"  rec Add:  augmentin x 10 more days done    04/20/18 listed as taking augmentin  Shortness of breath/wheezing: - Continue Symbicort 80 two puffs twice daily  - Use Albuterol rescue inhaler/nebulizer every 4-6 hours for sob/wheezing - DME referral for new nebulizer start   Cough: - Delsym cough syrup twice daily  - Mucinex twice daily   Pain: Take Aleve twice daily for left side pain (with food)         Diflucan rx 04/26/18 per ID    05/02/2018  f/u ov/Melissa [name] re:  "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 Follow-up    Her left side and pain and SOB have been progressively getting worse. She has been using her albuterol inhaler 2 x daily on average and neb once every night.   Dyspnea:  X 100 ft has to stop (see doe, not reproducible in office)  Cough: mostly clear now, does not flare overnight or in am Sleeping: R side down p tylenol #3 does fine/ no fever chills sweats or wt loss SABA use: once or  twice daily  Main concern is pain under L axillary area that prev reported "90%" improved now says that wasn't true and is "no better" but rarely taking tyl #3 as rec as "I have to work"  - aleve no help but takes anyway  No obvious day to day or daytime variability or assoc excess/ purulent sputum or mucus plugs or hemoptysis  or chest tightness, subjective wheeze or overt sinus or hb symptoms.   Sleeping as above without nocturnal  or early am exacerbation  of respiratory  c/o's or need for noct saba. Also denies any obvious fluctuation of symptoms with weather or environmental changes or other aggravating or alleviating factors except as outlined above   No unusual exposure hx or h/o childhood pna/  asthma or knowledge of premature birth.  Current Allergies, Complete Past Medical History, Past Surgical History, Family History, and Social History were reviewed in [facility] record.  ROS  The following are not active complaints unless bolded Hoarseness, sore throat, dysphagia, dental problems, itching, sneezing,  nasal congestion or discharge of excess mucus or purulent secretions, ear ache,   fever, chills, sweats, unintended wt loss or wt gain, classically   exertional cp,  orthopnea pnd or arm/hand swelling  or leg swelling, presyncope, palpitations, abdominal pain, anorexia, nausea, vomiting, diarrhea  or change in bowel habits or change in bladder habits, change in stools or change in urine, dysuria, hematuria,  rash, arthralgias, visual complaints, headache, numbness, weakness or ataxia or problems with walking or coordination,  change in mood or  memory.        Current Meds  Medication Sig  . acetaminophen-codeine (TYLENOL #3) 300-30 MG tablet Take 1 tablet by mouth every 4 (four) hours as needed (cough).  Marland Kitchen albuterol (VENTOLIN HFA) 108 (90 BASE) MCG/ACT inhaler Inhale 2 puffs into the lungs every 6 (six) hours as needed.  . budesonide-formoterol (SYMBICORT) 80-4.5 MCG/ACT inhaler Inhale 2 puffs into the lungs 2 (two) times daily.  . fluconazole (DIFLUCAN) 200 MG tablet Take 2 tablets (400 mg total) by mouth daily.  Marland Kitchen letrozole (FEMARA) 2.5 MG tablet Take 1 tablet (2.5 mg total) by mouth daily.  Marland Kitchen levalbuterol (XOPENEX) 0.63 MG/3ML nebulizer solution Take 3 mLs (0.63 mg total) by nebulization every 4 (four) hours as needed for wheezing or shortness of breath.  . naproxen sodium (ALEVE) 220 MG tablet Take 440 mg by mouth 2 (two) times daily as needed (headache).   . TYKERB 250 MG tablet TAKE 4 TABLETS (1,000 MG TOTAL) BY MOUTH DAILY. TAKE ON AN EMPTY STOMACH, 1 HOUR BEFORE OR 2 HOURS AFTER MEALS.  Marland Kitchen   acetaminophen-codeine (TYLENOL #3) 300-30 MG tablet Take 1 tablet  by mouth every 4 (four) hours as needed (cough).                      Objective:   Physical Exam  amb wf nad with minimally congested sounding cough  Vital signs reviewed - Note on arrival 02 sats  97% on  RA      Wt 118  02/10/11 >  05/03/2011  118 > 117 03/23/2012 >  122 07/30/2012 > 09/29/2014 121> 06/13/2016   119 > 07/15/2016 118 > 08/09/2016  118  > 11/25/2016   114 > 02/27/2017   116 > 06/16/2017  119 >  11/22/2017 112 >  02/28/2018 110 > 03/27/2018   108 > 04/11/2018  110 > 05/02/2018   109    HEENT: nl dentition,  turbinates bilaterally, and oropharynx. Nl external ear canals without cough reflex   NECK :  without JVD/Nodes/TM/ nl carotid upstrokes bilaterally   LUNGS: no acc muscle use,  Nl contour chest with minimal insp/exp rhonchi L  >R   without cough on insp or exp maneuvers   CV:  RRR  no s3 or murmur or increase in P2, and no edema   ABD:  soft and nontender with nl inspiratory excursion in the supine position. No bruits or organomegaly appreciated, bowel sounds nl  MS:  Nl gait/ ext warm without deformities, calf tenderness, cyanosis or clubbing No obvious joint restrictions   SKIN: warm and dry without lesions    NEURO:  alert, approp, nl sensorium with  no motor or cerebellar deficits apparent.      CXR PA and Lateral:   05/02/2018 :    I personally reviewed images and agree with radiology impression as follows:    1. Pleural and cavitary parenchymal process in the left upper lobe appears grossly stable radiographically. 2. Increased slightly nodular opacity in the left lower lobe, likely corresponds to recent CT demonstrated bronchiectasis with superimposed bronchial thickening and secretions/mucoid impaction.    Assessment:

## 2018-05-02 NOTE — Telephone Encounter (Signed)
John, Pharmacist from [facility] called office today regarding drug/ drug interaction. Spoke with Cassie, pharmacist today who stated Cancer center has already taken care of this and gave the okay for patient to take both medications. Informed pharmacy of this. [place]

## 2018-05-02 NOTE — Patient Instructions (Addendum)
For cough > mucinex dm up to 1200 mg every 12 hours as needed and supplement with tylenol #3 every 4 hours if needed    Please remember to go to the  Xray department downstairs in the basement  for your tests - we will call you with the results when they are available.   The key is to stop smoking completely before smoking completely stops you - this is the most important aspect for your longterm care

## 2018-05-03 ENCOUNTER — Encounter: Payer: Self-pay | Admitting: Internal Medicine

## 2018-05-03 DIAGNOSIS — R0609 Other forms of dyspnea: Secondary | ICD-10-CM | POA: Insufficient documentation

## 2018-05-03 NOTE — Progress Notes (Signed)
Spoke with pt and notified of results per Dr. Wert. Pt verbalized understanding and denied any questions. 

## 2018-05-03 NOTE — Assessment & Plan Note (Addendum)
-  CT chest   01/26/2011 Status post left upper lobectomy.  Focal bronchiectasis / scarring medially in the left lung apex with  surrounding inflammatory changes  - Pneumovax 08/12/11  And prevnar 08/09/2016  - 07/15/2016  After extensive coaching HFA effectiveness =    75% symbicort 80 2bid  - PFT's  08/09/2016  FEV1 2.05 (77 % ) ratio 70  p 5 % improvement from saba p dulera 100 x2  prior to study with DLCO  46 % corrects to 55  % for alv volume   - alpha one screening 03/27/2018  MM 267    I suspect most of what we're seeing now it apex of LLL is scarring but concerned about   1)  New nodular changes in LLL in pt with met breast ca/ bronchiectasis ideally needs tissue dx  2) extensive scarring from previous RUL obectomy limits a surgical option here   3) pt focused on recurrent cocci which is unlikely but has not been excluded  4) severe L cp in area of cavitary changes previously reported as 90% improved worsened back to 0% while still on empirical augmentin so no further abx given ? Could she have resistant org like pseudomonas or MAI    rec Will see if we can arrange for navigational FOB asap and if significant delay consider adding levaquin or cipro while waiting.   In meantime encouraged to stop smoking and use mucienex dm/ tylenol #3 for cough

## 2018-05-03 NOTE — Assessment & Plan Note (Addendum)
05/02/2018  Walked RA x 3 laps @ 185 ft each stopped due to  End of study, fast pace, no   desat - min sob     Unable to reproduce her c/o sob x 100 ft walking - changed hx to "only if go up steps"  No change in pulmonary meds needed at this point     I had an extended discussion with the patient and husband  reviewing all relevant studies completed to date and  lasting 25 minutes of a 40  minute office  Visit addressing  severe non-specific but potentially very serious refractory respiratory symptoms of uncertain and potentially multiple  Etiologies.  Discussed smoking cessation becoming a matter of life or death, especially if surgery needed on L lung   Each maintenance medication was reviewed in detail including most importantly the difference between maintenance and prns and under what circumstances the prns are to be triggered using an action plan format that is not reflected in the computer generated alphabetically organized AVS.    Please see AVS for specific instructions unique to this office visit that I personally wrote and verbalized to the the pt in detail and then reviewed with pt  by my nurse highlighting any changes in therapy/plan of care  recommended at today's visit.

## 2018-05-03 NOTE — Assessment & Plan Note (Signed)
-   CT chest   01/26/2011 Status post left upper lobectomy.  Focal bronchiectasis / scarring medially in the left lung apex with  surrounding inflammatory changes  - Pneumovax 08/12/11  And prevnar 08/09/2016  - 07/15/2016  After extensive coaching HFA effectiveness =    75% symbicort 80 2bid  - PFT's  08/09/2016  FEV1 2.05 (77 % ) ratio 70  p 5 % improvement from saba p dulera 100 x2  prior to study with DLCO  46 % corrects to 55  % for alv volume   - alpha one screening 03/27/2018  MM 267    This component of her problem is an ongoing challenge secondary to smoking but I don't see the need for additional abx or change in rx a this point.

## 2018-05-06 ENCOUNTER — Emergency Department (HOSPITAL_COMMUNITY): Payer: 59

## 2018-05-06 ENCOUNTER — Emergency Department (HOSPITAL_COMMUNITY)
Admission: EM | Admit: 2018-05-06 | Discharge: 2018-05-06 | Disposition: A | Discharge status: Home / Self Care | Payer: 59 | Attending: Emergency Medicine | Admitting: Emergency Medicine

## 2018-05-06 ENCOUNTER — Other Ambulatory Visit: Payer: Self-pay

## 2018-05-06 ENCOUNTER — Encounter (HOSPITAL_COMMUNITY): Payer: Self-pay | Admitting: Emergency Medicine

## 2018-05-06 DIAGNOSIS — Z853 Personal history of malignant neoplasm of breast: Secondary | ICD-10-CM | POA: Insufficient documentation

## 2018-05-06 DIAGNOSIS — R05 Cough: Secondary | ICD-10-CM | POA: Diagnosis not present

## 2018-05-06 DIAGNOSIS — F17210 Nicotine dependence, cigarettes, uncomplicated: Secondary | ICD-10-CM | POA: Insufficient documentation

## 2018-05-06 DIAGNOSIS — C7931 Secondary malignant neoplasm of brain: Secondary | ICD-10-CM | POA: Insufficient documentation

## 2018-05-06 DIAGNOSIS — R079 Chest pain, unspecified: Secondary | ICD-10-CM

## 2018-05-06 DIAGNOSIS — J45909 Unspecified asthma, uncomplicated: Secondary | ICD-10-CM | POA: Insufficient documentation

## 2018-05-06 DIAGNOSIS — R0789 Other chest pain: Secondary | ICD-10-CM | POA: Insufficient documentation

## 2018-05-06 DIAGNOSIS — Z79899 Other long term (current) drug therapy: Secondary | ICD-10-CM | POA: Insufficient documentation

## 2018-05-06 LAB — BASIC METABOLIC PANEL
Anion gap: 7 (ref 5–15)
BUN: 9 mg/dL (ref 6–20)
CALCIUM: 9.5 mg/dL (ref 8.9–10.3)
CHLORIDE: 101 mmol/L (ref 98–111)
CO2: 26 mmol/L (ref 22–32)
CREATININE: 0.85 mg/dL (ref 0.44–1.00)
Glucose, Bld: 75 mg/dL (ref 70–99)
Potassium: 4.3 mmol/L (ref 3.5–5.1)
SODIUM: 134 mmol/L — AB (ref 135–145)

## 2018-05-06 LAB — TROPONIN I

## 2018-05-06 NOTE — Discharge Instructions (Addendum)
Follow back up with your specialist pulmonary medicine as well as hematology oncology.  Return for any new or worse symptoms.  Today's work-up chest x-ray without any significant changes from what is known about the left lung problem.  Also EKG without any acute cardiac changes.  And a troponin heart marker was normal.  Symptoms sound very chest wall in nature.  And basic electrolytes were normal as well.  Continue your current medications.

## 2018-05-06 NOTE — ED Provider Notes (Signed)
[facility] EMERGENCY DEPARTMENT Provider Note   CSN: [id number] Arrival date & time: 05/06/18  8676     History   Chief Complaint Chief Complaint  Patient presents with  . Chest Pain    HPI Melissa [name] is a 52 y.o. female.  Patient brought in by POV.  Patient with a complaint of chronic pain left upper chest due to unknown findings whether they be fungal or recurrent breast CA.  Patient known to have a history of metastatic breast cancer.  Went to the brain but that was resected.  Followed hematology oncology.  Developed these cavitated type lesion in the left side of her lung.  Is being followed also by pulmonary medicine.  Seen by them just on 23 October.  Patient states she had sudden onset of left-sided chest pain made worse by taking deep breaths that happened at about 4 this morning.  She had been doing some coughing and felt like muscle spasm.  Patient states she is very well that they are having trouble figuring out was called left lung her concern was that she had a pneumothorax on that side because she has had that happen before.  Chest pain really not in a cardiac pattern.     Past Medical History:  Diagnosis Date  . Anemia   . Arthritis    knees and hips  . Asthma   . Breast cancer ([place])   . Bronchiolitis   . Cancer [facility])    breast cancer 2014  . Complication of anesthesia    bp dropped + desat   . COPD (chronic obstructive pulmonary disease) ([place])   . GERD (gastroesophageal reflux disease)   . H/O coccidioidomycosis    was reason for lung lobectomy  . Headache(784.0)    due to eye strain or not eating  . History of anemia    no current problem  . History of asthma    as a child  . History of breast cancer 2014   left  . History of chemotherapy    finished 07/17/2013  . History of hiatal hernia   . Hx of radiation therapy 03/25/13-05/06/13   left breast 5000 cGy/25 sessions, left breast boost 1000 cGy/5 sessions  . Runny nose 07/30/2013   clear  drainage  . Wears dentures    upper    Patient Active Problem List   Diagnosis Date Noted  . DOE (dyspnea on exertion) 05/03/2018  . Diarrhea 04/22/2018  . Cavitary lesion of lung in area of previous cyts in apex of Sup Segment of LLL 03/29/2018  . Aortic atherosclerosis ([place]) 01/22/2018  . Emphysema lung ([place]) 01/22/2018  . CAP (community acquired pneumonia) 11/23/2017  . Metastasis to brain ([place]) 08/04/2017  . Brain metastasis ([place]) 07/27/2017  . Nicotine abuse 07/27/2017  . Migraines 07/27/2017  . HCAP (healthcare-associated pneumonia) 11/26/2016  . Upper airway cough syndrome 08/10/2016  . Abnormal echocardiogram 08/07/2013  . Chest tightness or pressure 08/07/2013  . Hx of radiation therapy   . Edema of left lower extremity 11/19/2012  . Tachycardia 09/20/2012  . Breast cancer of upper-inner quadrant of left female breast ([place]) 06/08/2012  . Obstructive bronchiectasis ([place]) 01/26/2011  . Cigarette smoker 01/26/2011    Past Surgical History:  Procedure Laterality Date  . APPLICATION OF CRANIAL NAVIGATION N/A 08/04/2017   Procedure: APPLICATION OF CRANIAL NAVIGATION;  Surgeon: [name], [name], MD;  Location: [place];  Service: Neurosurgery;  Laterality: N/A;  . AXILLARY LYMPH NODE DISSECTION Left 02/08/2013  Procedure: LEFT AXILLARY DISSECTION;  Surgeon: Edward Jolly, MD;  Location: [place];  Service: General;  Laterality: Left;  . BREAST CYST EXCISION Right 2006  . BREAST LUMPECTOMY Left 2014  . BREAST LUMPECTOMY WITH NEEDLE LOCALIZATION AND AXILLARY SENTINEL LYMPH NODE BX Left 12/31/2012   Procedure: NEEDLE LOCALIZATION LEFT BREAST LUMPECTOMY AND LEFT AXILLARY SENTENIAL LYMPH NODE BX;  Surgeon: Edward Jolly, MD;  Location: [place];  Service: General;  Laterality: Left;  . CESAREAN SECTION  1995/1996  . CRANIOTOMY Right 08/04/2017   Procedure: Right Frontal craniotomy for resection of tumor with stereotactic navigation;   Surgeon: [name], [name], MD;  Location: [place];  Service: Neurosurgery;  Laterality: Right;  Right Frontal craniotomy for resection of tumor with stereotactic navigation  . LUNG LOBECTOMY Left 05/1996   upper lobe - due to [facility] Fever  . PORT-A-CATH REMOVAL Right 08/02/2013   Procedure: REMOVAL PORT-A-CATH;  Surgeon: Edward Jolly, MD;  Location: [place];  Service: General;  Laterality: Right;  . PORTACATH PLACEMENT  07/02/2012   Procedure: INSERTION PORT-A-CATH;  Surgeon: Edward Jolly, MD;  Location: [place];  Service: General;  Laterality: N/A;  right     OB History    Gravida      Para      Term      Preterm      AB      Living  2     SAB      TAB      Ectopic      Multiple      Live Births               Home Medications    Prior to Admission medications   Medication Sig Start Date End Date Taking? Authorizing Provider  acetaminophen-codeine (TYLENOL #3) 300-30 MG tablet Take 1 tablet by mouth every 4 (four) hours as needed (cough). 05/02/18  Yes Tanda Rockers, MD  albuterol (VENTOLIN HFA) 108 (90 BASE) MCG/ACT inhaler Inhale 2 puffs into the lungs every 6 (six) hours as needed. 12/20/12  Yes Causey, Charlestine Massed, NP  budesonide-formoterol (SYMBICORT) 80-4.5 MCG/ACT inhaler Inhale 2 puffs into the lungs 2 (two) times daily. 03/30/18  Yes Tanda Rockers, MD  fluconazole (DIFLUCAN) 200 MG tablet Take 2 tablets (400 mg total) by mouth daily. 04/24/18  Yes Carlyle Basques, MD  letrozole [facility]) 2.5 MG tablet Take 1 tablet (2.5 mg total) by mouth daily. 08/11/17  Yes Nicholas Lose, MD  levalbuterol (XOPENEX) 0.63 MG/3ML nebulizer solution Take 3 mLs (0.63 mg total) by nebulization every 4 (four) hours as needed for wheezing or shortness of breath. 04/20/18  Yes Martyn Ehrich, NP  naproxen sodium (ALEVE) 220 MG tablet Take 440 mg by mouth 2 (two) times daily as needed (headache).    Yes [provider]  TYKERB 250 MG tablet TAKE 4 TABLETS (1,000 MG TOTAL) BY MOUTH DAILY. TAKE ON AN EMPTY STOMACH, 1 HOUR BEFORE OR 2 HOURS AFTER MEALS. 03/05/18  Yes Nicholas Lose, MD    Family History Family History  Problem Relation Age of Onset  . Emphysema Mother        was a smoker  . Heart disease Mother   . Melanoma Mother        dx in her 54s  . Breast cancer Mother 17  . Asthma Brother   . Breast cancer Cousin  mother's maternal cousin; dx in her 3s    Social History Social History   Tobacco Use  . Smoking status: Current Every Day Smoker    Packs/day: 0.75    Years: 24.00    Pack years: 18.00    Types: Cigarettes  . Smokeless tobacco: Never Used  Substance Use Topics  . Alcohol use: No  . Drug use: No     Allergies   Aspirin; Protonix [pantoprazole]; and Iodinated diagnostic agents   Review of Systems Review of Systems  Constitutional: Negative for fever.  HENT: Negative for congestion.   Eyes: Negative for visual disturbance.  Respiratory: Negative for shortness of breath.   Cardiovascular: Positive for chest pain.  Gastrointestinal: Negative for abdominal pain.  Genitourinary: Negative for dysuria.  Musculoskeletal: Positive for back pain.  Skin: Negative for rash.  Neurological: Negative for headaches.  Hematological: Does not bruise/bleed easily.  Psychiatric/Behavioral: Negative for confusion.     Physical Exam Updated Vital Signs BP 100/69   Pulse 65   Temp 98 F (36.7 C) (Oral)   Resp (!) 23   Ht 1.575 m (5\' 2" )   Wt 49.4 kg   LMP 07/25/2012 Comment: pregnancy waiver form signed 01-18-2018  SpO2 96%   BMI 19.94 kg/m   Physical Exam  Constitutional: She is oriented to person, place, and time. She appears well-developed and well-nourished. No distress.  HENT:  Head: Normocephalic and atraumatic.  Mouth/Throat: Oropharynx is clear and moist.  Eyes: Pupils are equal, round, and reactive to light. Conjunctivae and EOM are normal.  Neck:  Normal range of motion. Neck supple.  Cardiovascular: Normal rate, regular rhythm and normal heart sounds.  Pulmonary/Chest: Effort normal and breath sounds normal. No respiratory distress. She exhibits no tenderness.  Decreased breath sounds on the left.  Abdominal: Soft. Bowel sounds are normal. There is no tenderness.  Musculoskeletal: Normal range of motion. She exhibits no edema.  Neurological: She is alert and oriented to person, place, and time. No cranial nerve deficit or sensory deficit. She exhibits normal muscle tone. Coordination normal.  Skin: Skin is warm. No rash noted.  Nursing note and vitals reviewed.    ED Treatments / Results  Labs (all labs ordered are listed, but only abnormal results are displayed) Labs Reviewed  BASIC METABOLIC PANEL - Abnormal; Notable for the following components:      Result Value   Sodium 134 (*)    All other components within normal limits  TROPONIN I    EKG EKG Interpretation  Date/Time:  Sunday May 06 2018 09:37:20 EDT Ventricular Rate:  79 PR Interval:    QRS Duration: 92 QT Interval:  385 QTC Calculation: 442 R Axis:   82 Text Interpretation:  Sinus rhythm Baseline wander in lead(s) V4 Confirmed by Fredia Sorrow [phone]) on 05/06/2018 9:56:20 AM Also confirmed by Fredia Sorrow [phone])  on 05/06/2018 1:02:26 PM   Radiology Dg Chest 2 View  Result Date: 05/06/2018 CLINICAL DATA:  Left chest pain, unknown mass, on antifungal medication EXAM: CHEST - 2 VIEW COMPARISON:  Chest radiographs dated 05/02/2018. CT chest dated 04/26/2017. FINDINGS: Status post left upper lobectomy. Cavitary lesion in the lateral left upper hemithorax (upper aspect of the left lower lobe), better evaluated on prior CT. Additional nodular opacities in the left lower lobe. Right lung is clear. The heart is normal in size. Visualized osseous structures are within normal limits. Surgical clips in the left chest wall/axilla. IMPRESSION: Status post  left upper lobectomy. Thick-walled cavitary lesion infected  fluid mid superiorly in the left lower lobe. Additional nodular opacities in the left lower lobe. These findings are better evaluated on prior CT. Electronically Signed   By: Julian Hy M.D.   On: 05/06/2018 12:16    Procedures Procedures (including critical care time)  Medications Ordered in ED Medications - No data to display   Initial Impression / Assessment and Plan / ED Course  I have reviewed the triage vital signs and the nursing notes.  Pertinent labs & imaging results that were available during my care of the patient were reviewed by me and considered in my medical decision making (see chart for details).    Patient is EKG without any acute cardiac findings.  Troponin was negative.  I had ordered an i-STAT 8.  They get a look at her hemoglobin hematocrit but nurses switch that over to a BMP so we do not have H&H.  However electrolytes without significant abnormalities other than a mild hyponatremia at 134.  Chest x-ray showed no significant changes compared to her recent CT scan which was done this month.  No evidence of a pneumothorax.  Patient feeling much better now.  Seems that this was chest wall in nature.  And most likely not acute cardiac event.  PE not totally ruled out.  The patient without any hypoxia room air sats were in the upper 90s.  And not tachycardic.  Patient is comfortable with going home and following up with pulmonary medicine hematology oncology.   Final Clinical Impressions(s) / ED Diagnoses   Final diagnoses:  Chest pain, unspecified type    ED Discharge Orders    None       Fredia Sorrow, MD 05/06/18 [phone]

## 2018-05-06 NOTE — ED Notes (Signed)
I stat changed to lab draw as pt already has blood in lab from iv stick and too old to do I stat.  Pt did not wish to be stuck again.

## 2018-05-06 NOTE — ED Triage Notes (Signed)
PT c/o chronic pain to left upper chest due to unknown mass to the same area. PT states pain became worse this am around 0400. PT states unknown mass has decreased in size with antifungal medication recently taking and is seeing a pulmonologist and infection disease doctor.

## 2018-05-11 MED FILL — TYKERB 250 MG TABLET: 250 | 30 days supply | Qty: 120 | Fill #2

## 2018-05-16 ENCOUNTER — Encounter: Payer: Self-pay | Admitting: Pulmonary Disease

## 2018-05-16 ENCOUNTER — Ambulatory Visit: Payer: 59 | Admitting: Pulmonary Disease

## 2018-05-16 ENCOUNTER — Other Ambulatory Visit: Payer: Self-pay | Admitting: Radiation Therapy

## 2018-05-16 VITALS — BP 108/70 | HR 80 | Ht 62.0 in | Wt 108.9 lb

## 2018-05-16 DIAGNOSIS — C7931 Secondary malignant neoplasm of brain: Secondary | ICD-10-CM

## 2018-05-16 DIAGNOSIS — J479 Bronchiectasis, uncomplicated: Secondary | ICD-10-CM

## 2018-05-16 DIAGNOSIS — Z170 Estrogen receptor positive status [ER+]: Secondary | ICD-10-CM

## 2018-05-16 DIAGNOSIS — J984 Other disorders of lung: Secondary | ICD-10-CM | POA: Diagnosis not present

## 2018-05-16 DIAGNOSIS — Z8619 Personal history of other infectious and parasitic diseases: Secondary | ICD-10-CM | POA: Diagnosis not present

## 2018-05-16 DIAGNOSIS — R590 Localized enlarged lymph nodes: Secondary | ICD-10-CM

## 2018-05-16 DIAGNOSIS — C50212 Malignant neoplasm of upper-inner quadrant of left female breast: Secondary | ICD-10-CM

## 2018-05-16 NOTE — H&P (View-Only) (Signed)
Synopsis: Referred in November 2019 for bronchoscopy evaluation by Dr. Melvyn Novas.  Subjective:   PATIENT ID: Melissa [name] GENDER: female DOB: 10-21-1965, MRN: [id number]  Chief Complaint  Patient presents with  . Follow-up    Needs to discuss possible biopsy.     This is a 52 year old female with a history of coccidiomycosis, [place] fever in [place] status post lobectomy, left upper lobe in 1997 years of antifungal therapy on fluconazole.  She has subsequently over the years developed bronchiectasis.  Unfortunately she is still an active smoker.  In November 2013 she had breast cancer status post lung neck lumpectomy with a positive node and finished with XRT and chemo in 2015.  In May 2019 had MRI concerning for right parietal brain metastasis.  The patient ultimately underwent resection of the frontal lobe metastasis.  The patient's current symptoms include cough and daily sputum production.  She is using her flutter valve.  Unfortunately she still is smoking.  She states that she is trying to quit but she has had much difficulty.  Patient denies fevers, chills, night sweats.  But she does have chronic sputum production that is yellow-green in color.  [facility] which is recommended bronchoscopy for sampling and cultures.   Past Medical History:  Diagnosis Date  . Anemia   . Arthritis    knees and hips  . Asthma   . Breast cancer ([place])   . Bronchiolitis   . Cancer [facility])    breast cancer 2014  . Complication of anesthesia    bp dropped + desat   . COPD (chronic obstructive pulmonary disease) ([place])   . GERD (gastroesophageal reflux disease)   . H/O coccidioidomycosis    was reason for lung lobectomy  . Headache(784.0)    due to eye strain or not eating  . History of anemia    no current problem  . History of asthma    as a child  . History of breast cancer 2014   left  . History of chemotherapy    finished 07/17/2013  . History of  hiatal hernia   . Hx of radiation therapy 03/25/13-05/06/13   left breast 5000 cGy/25 sessions, left breast boost 1000 cGy/5 sessions  . Runny nose 07/30/2013   clear drainage  . Wears dentures    upper     Family History  Problem Relation Age of Onset  . Emphysema Mother        was a smoker  . Heart disease Mother   . Melanoma Mother        dx in her 76s  . Breast cancer Mother 52  . Asthma Brother   . Breast cancer Cousin        mother's maternal cousin; dx in her 78s     Past Surgical History:  Procedure Laterality Date  . APPLICATION OF CRANIAL NAVIGATION N/A 08/04/2017   Procedure: APPLICATION OF CRANIAL NAVIGATION;  Surgeon: [name], [name], MD;  Location: [place];  Service: Neurosurgery;  Laterality: N/A;  . AXILLARY LYMPH NODE DISSECTION Left 02/08/2013   Procedure: LEFT AXILLARY DISSECTION;  Surgeon: Edward Jolly, MD;  Location: [place];  Service: General;  Laterality: Left;  . BREAST CYST EXCISION Right 2006  . BREAST LUMPECTOMY Left 2014  . BREAST LUMPECTOMY WITH NEEDLE LOCALIZATION AND AXILLARY SENTINEL LYMPH NODE BX Left 12/31/2012   Procedure: NEEDLE LOCALIZATION LEFT BREAST LUMPECTOMY AND LEFT AXILLARY SENTENIAL LYMPH NODE BX;  Surgeon: Edward Jolly, MD;  Location: [place];  Service: General;  Laterality: Left;  . CESAREAN SECTION  1995/1996  . CRANIOTOMY Right 08/04/2017   Procedure: Right Frontal craniotomy for resection of tumor with stereotactic navigation;  Surgeon: [name], [name], MD;  Location: [place];  Service: Neurosurgery;  Laterality: Right;  Right Frontal craniotomy for resection of tumor with stereotactic navigation  . LUNG LOBECTOMY Left 05/1996   upper lobe - due to [facility] Fever  . PORT-A-CATH REMOVAL Right 08/02/2013   Procedure: REMOVAL PORT-A-CATH;  Surgeon: Edward Jolly, MD;  Location: [place];  Service: General;  Laterality: Right;  . PORTACATH PLACEMENT  07/02/2012     Procedure: INSERTION PORT-A-CATH;  Surgeon: Edward Jolly, MD;  Location: [place];  Service: General;  Laterality: N/A;  right    Social History   Socioeconomic History  . Marital status: Married    Spouse name: Not on file  . Number of children: 2  . Years of education: Not on file  . Highest education level: Not on file  Occupational History  . Occupation: Secondary school teacher  Social Needs  . Financial resource strain: Not on file  . Food insecurity:    Worry: Not on file    Inability: Not on file  . Transportation needs:    Medical: Not on file    Non-medical: Not on file  Tobacco Use  . Smoking status: Current Every Day Smoker    Packs/day: 0.75    Years: 24.00    Pack years: 18.00    Types: Cigarettes  . Smokeless tobacco: Never Used  Substance and Sexual Activity  . Alcohol use: No  . Drug use: No  . Sexual activity: Yes    Comment: trying to quit  Lifestyle  . Physical activity:    Days per week: Not on file    Minutes per session: Not on file  . Stress: Not on file  Relationships  . Social connections:    Talks on phone: Not on file    Gets together: Not on file    Attends religious service: Not on file    Active member of club or organization: Not on file    Attends meetings of clubs or organizations: Not on file    Relationship status: Not on file  . Intimate partner violence:    Fear of current or ex partner: No    Emotionally abused: No    Physically abused: No    Forced sexual activity: No  Other Topics Concern  . Not on file  Social History Narrative  . Not on file     Allergies  Allergen Reactions  . Aspirin Anaphylaxis and Shortness Of Breath    THROAT CLOSES  . Protonix [Pantoprazole] Nausea Only and Other (See Comments)    Also caused a "film in the mouth" and caused chest pressure  . Iodinated Diagnostic Agents Rash     Outpatient Medications Prior to Visit  Medication Sig Dispense Refill  .  acetaminophen-codeine (TYLENOL #3) 300-30 MG tablet Take 1 tablet by mouth every 4 (four) hours as needed (cough). 30 tablet 0  . albuterol (VENTOLIN HFA) 108 (90 BASE) MCG/ACT inhaler Inhale 2 puffs into the lungs every 6 (six) hours as needed. 1 Inhaler 3  . budesonide-formoterol (SYMBICORT) 80-4.5 MCG/ACT inhaler Inhale 2 puffs into the lungs 2 (two) times daily. 1 Inhaler 5  . fluconazole (DIFLUCAN) 200 MG tablet Take 2 tablets (400  mg total) by mouth daily. 60 tablet 1  . letrozole (FEMARA) 2.5 MG tablet Take 1 tablet (2.5 mg total) by mouth daily. 90 tablet 3  . levalbuterol (XOPENEX) 0.63 MG/3ML nebulizer solution Take 3 mLs (0.63 mg total) by nebulization every 4 (four) hours as needed for wheezing or shortness of breath. 540 mL 6  . naproxen sodium (ALEVE) 220 MG tablet Take 440 mg by mouth 2 (two) times daily as needed (headache).     . TYKERB 250 MG tablet TAKE 4 TABLETS (1,000 MG TOTAL) BY MOUTH DAILY. TAKE ON AN EMPTY STOMACH, 1 HOUR BEFORE OR 2 HOURS AFTER MEALS. 150 tablet 3   No facility-administered medications prior to visit.     Review of Systems  Constitutional: Negative for chills, fever, malaise/fatigue and weight loss.  HENT: Negative for hearing loss, sore throat and tinnitus.   Eyes: Negative for blurred vision and double vision.  Respiratory: Positive for cough, sputum production, shortness of breath and wheezing. Negative for hemoptysis and stridor.   Cardiovascular: Negative for chest pain, palpitations, orthopnea, leg swelling and PND.  Gastrointestinal: Negative for abdominal pain, constipation, diarrhea, heartburn, nausea and vomiting.  Genitourinary: Negative for dysuria, hematuria and urgency.  Musculoskeletal: Negative for joint pain and myalgias.  Skin: Negative for itching and rash.  Neurological: Negative for dizziness, tingling, weakness and headaches.  Endo/Heme/Allergies: Negative for environmental allergies. Does not bruise/bleed easily.    Psychiatric/Behavioral: Negative for depression. The patient is not nervous/anxious and does not have insomnia.   All other systems reviewed and are negative.    Objective:  Physical Exam  Constitutional: She is oriented to person, place, and time. She appears well-developed and well-nourished. No distress.  HENT:  Head: Normocephalic and atraumatic.  Mouth/Throat: Oropharynx is clear and moist.  Eyes: Pupils are equal, round, and reactive to light. Conjunctivae are normal. No scleral icterus.  Neck: Neck supple. No JVD present. No tracheal deviation present.  Cardiovascular: Normal rate, regular rhythm, normal heart sounds and intact distal pulses.  No murmur heard. Pulmonary/Chest: Effort normal. No accessory muscle usage or stridor. No tachypnea. No respiratory distress. She has wheezes. She has no rhonchi. She has rales.  Tubular breath sounds predominantly within the left chest  Abdominal: Soft. Bowel sounds are normal. She exhibits no distension. There is no tenderness.  Musculoskeletal: She exhibits no edema or tenderness.  Lymphadenopathy:    She has no cervical adenopathy.  Neurological: She is alert and oriented to person, place, and time.  Skin: Skin is warm and dry. Capillary refill takes less than 2 seconds. No rash noted.  Psychiatric: She has a normal mood and affect. Her behavior is normal.  Vitals reviewed.    Vitals:   05/16/18 1453  BP: 108/70  Pulse: 80  SpO2: 95%  Weight: 108 lb 14.4 oz (49.4 kg)  Height: 5\' 2"  (1.575 m)   95% on RA. BMI Readings from Last 3 Encounters:  05/16/18 19.92 kg/m  05/06/18 19.94 kg/m  05/02/18 20.01 kg/m   Wt Readings from Last 3 Encounters:  05/16/18 108 lb 14.4 oz (49.4 kg)  05/06/18 109 lb (49.4 kg)  05/02/18 109 lb 6.4 oz (49.6 kg)     CBC    Component Value Date/Time   WBC 10.5 04/11/2018 1706   RBC 3.71 (L) 04/11/2018 1706   HGB 10.9 (L) 04/11/2018 1706   HGB 11.2 (L) 03/26/2018 1507   HGB 13.1  08/12/2014 0955   HCT 32.8 (L) 04/11/2018 1706   HCT 40.4  08/12/2014 0955   PLT 458.0 (H) 04/11/2018 1706   PLT 378 03/26/2018 1507   PLT 228 08/12/2014 0955   MCV 88.6 04/11/2018 1706   MCV 98.2 08/12/2014 0955   MCH 29.7 03/26/2018 1507   MCHC 33.2 04/11/2018 1706   RDW 15.5 04/11/2018 1706   RDW 13.0 08/12/2014 0955   LYMPHSABS 2.6 04/11/2018 1706   LYMPHSABS 2.0 08/12/2014 0955   MONOABS 0.7 04/11/2018 1706   MONOABS 0.5 08/12/2014 0955   EOSABS 1.3 (H) 04/11/2018 1706   EOSABS 0.2 08/12/2014 0955   BASOSABS 0.1 04/11/2018 1706   BASOSABS 0.0 08/12/2014 0955    Chest Imaging: 04/26/2018: CT imaging Left upper lobe cavity, cystic bronchiectasis thick secretions. Subcarinal adenopathy The patient's images have been independently reviewed by me.    Pulmonary Functions Testing Results: PFT Results Latest Ref Rng & Units 08/09/2016  FVC-Pre L 2.79  FVC-Predicted Pre % 83  FVC-Post L 2.92  FVC-Predicted Post % 87  Pre FEV1/FVC % % 69  Post FEV1/FCV % % 70  FEV1-Pre L 1.94  FEV1-Predicted Pre % 73  DLCO UNC% % 46  DLCO COR %Predicted % 55  TLC L 4.75  TLC % Predicted % 100  RV % Predicted % 124    FeNO: None   Pathology: None   Echocardiogram:  09/18/2017: EF 60-65%, G1d  Heart Catheterization: None    Assessment & Plan:   Cavitary lesion of lung in area of previous cyts in apex of Sup Segment of LLL  Malignant neoplasm of upper-inner quadrant of left breast in female, estrogen receptor positive ([place])  Brain metastasis ([facility])  H/O coccidioidomycosis  Obstructive bronchiectasis ([facility])  Mediastinal adenopathy  Discussion:  We will plan for EBUS bronchoscopy with biopsy of the subcarinal lymphadenopathy due to the patient's history of metastatic breast cancer.  In addition we will send the tissue for culture.  We will also complete washings of the left upper lobe as well as fluoroscopic guided micro brushes as well as bronchioloalveolar lavage for  culture.  Additionally we [facility]ology lab.  We discussed the risk, versus benefits versus alternatives to bronchoscopy today in clinic.  Patient has agreed and elected to proceed with outpatient bronchoscopy.  We will plan for outpatient bronchoscopy sometime next week.  Staff to follow-up with scheduling.  Return to clinic following bronchoscopy  Greater than 50% of this patient's 60-minute office visit was spent face-to-face discussing the above diagnostic treatment plan and risk/benefits/alternatives to bronchoscopy.   Current Outpatient Medications:  .  acetaminophen-codeine (TYLENOL #3) 300-30 MG tablet, Take 1 tablet by mouth every 4 (four) hours as needed (cough)., Disp: 30 tablet, Rfl: 0 .  albuterol (VENTOLIN HFA) 108 (90 BASE) MCG/ACT inhaler, Inhale 2 puffs into the lungs every 6 (six) hours as needed., Disp: 1 Inhaler, Rfl: 3 .  budesonide-formoterol (SYMBICORT) 80-4.5 MCG/ACT inhaler, Inhale 2 puffs into the lungs 2 (two) times daily., Disp: 1 Inhaler, Rfl: 5 .  fluconazole (DIFLUCAN) 200 MG tablet, Take 2 tablets (400 mg total) by mouth daily., Disp: 60 tablet, Rfl: 1 .  letrozole (FEMARA) 2.5 MG tablet, Take 1 tablet (2.5 mg total) by mouth daily., Disp: 90 tablet, Rfl: 3 .  levalbuterol (XOPENEX) 0.63 MG/3ML nebulizer solution, Take 3 mLs (0.63 mg total) by nebulization every 4 (four) hours as needed for wheezing or shortness of breath., Disp: 540 mL, Rfl: 6 .  naproxen sodium (ALEVE) 220 MG tablet, Take 440 mg by mouth 2 (  two) times daily as needed (headache). , Disp: , Rfl:  .  TYKERB 250 MG tablet, TAKE 4 TABLETS (1,000 MG TOTAL) BY MOUTH DAILY. TAKE ON AN EMPTY STOMACH, 1 HOUR BEFORE OR 2 HOURS AFTER MEALS., Disp: 150 tablet, Rfl: 3   Garner Nash, DO [facility] 05/16/2018 2:56 PM

## 2018-05-16 NOTE — Progress Notes (Signed)
Melissa [name] 52 yo woman with a 2-3 mm right parietal brain metastasis from cancer of the upper inner left breast completed 12-18-17 MRI brain w wo contrast 05-18-18 FU.   Headache:Mostly once a week takes aleve with relief Pain:No Nausea/Vomiting:No Visional changes: No Ringing in ears:No Fatigue:Yes, feels it is going along with her lung issues.  Having a bronchoscopy tomorrow.  Coughing up clear to pale yellow secretions with fever on Diflucan 200 mg 2 tablets daily x 30 days started 04-24-18 Cognitive changes: No alert and oriented x 4. Wt Readings from Last 3 Encounters:  05/22/18 111 lb 6.4 oz (50.5 kg)  05/22/18 111 lb 3.2 oz (50.4 kg)  05/16/18 108 lb 14.4 oz (49.4 kg)  BP (!) 116/53 (BP Location: Right Arm, Patient Position: Sitting)   Pulse 72   Temp 98.5 F (36.9 C) (Oral)   Ht 5\' 2"  (1.575 m)   Wt 111 lb 6.4 oz (50.5 kg)   LMP 07/25/2012 Comment: pregnancy waiver form signed 01-18-2018  SpO2 100%   BMI 20.38 kg/m

## 2018-05-16 NOTE — Progress Notes (Signed)
Synopsis: Referred in November 2019 for bronchoscopy evaluation by Dr. Melvyn Novas.  Subjective:   PATIENT ID: Melissa [name] GENDER: female DOB: 31-Jan-1966, MRN: [id number]  Chief Complaint  Patient presents with  . Follow-up    Needs to discuss possible biopsy.     This is a 52 year old female with a history of coccidiomycosis, [place] fever in [place] status post lobectomy, left upper lobe in 1997 years of antifungal therapy on fluconazole.  She has subsequently over the years developed bronchiectasis.  Unfortunately she is still an active smoker.  In November 2013 she had breast cancer status post lung neck lumpectomy with a positive node and finished with XRT and chemo in 2015.  In May 2019 had MRI concerning for right parietal brain metastasis.  The patient ultimately underwent resection of the frontal lobe metastasis.  The patient's current symptoms include cough and daily sputum production.  She is using her flutter valve.  Unfortunately she still is smoking.  She states that she is trying to quit but she has had much difficulty.  Patient denies fevers, chills, night sweats.  But she does have chronic sputum production that is yellow-green in color.  [facility] is recommended [facility]oscopy for sampling and cultures.   Past Medical History:  Diagnosis Date  . Anemia   . Arthritis    knees and hips  . Asthma   . Breast cancer ([place])   . Bronchiolitis   . Cancer [facility])    breast cancer 2014  . Complication of anesthesia    bp dropped + desat   . COPD (chronic obstructive pulmonary disease) ([place])   . GERD (gastroesophageal reflux disease)   . H/O coccidioidomycosis    was reason for lung lobectomy  . Headache(784.0)    due to eye strain or not eating  . History of anemia    no current problem  . History of asthma    as a child  . History of breast cancer 2014   left  . History of chemotherapy    finished 07/17/2013  . History of  hiatal hernia   . Hx of radiation therapy 03/25/13-05/06/13   left breast 5000 cGy/25 sessions, left breast boost 1000 cGy/5 sessions  . Runny nose 07/30/2013   clear drainage  . Wears dentures    upper     Family History  Problem Relation Age of Onset  . Emphysema Mother        was a smoker  . Heart disease Mother   . Melanoma Mother        dx in her 64s  . Breast cancer Mother 75  . Asthma Brother   . Breast cancer Cousin        mother's maternal cousin; dx in her 70s     Past Surgical History:  Procedure Laterality Date  . APPLICATION OF CRANIAL NAVIGATION N/A 08/04/2017   Procedure: APPLICATION OF CRANIAL NAVIGATION;  Surgeon: [name], [name], MD;  Location: [place];  Service: Neurosurgery;  Laterality: N/A;  . AXILLARY LYMPH NODE DISSECTION Left 02/08/2013   Procedure: LEFT AXILLARY DISSECTION;  Surgeon: Edward Jolly, MD;  Location: [place];  Service: General;  Laterality: Left;  . BREAST CYST EXCISION Right 2006  . BREAST LUMPECTOMY Left 2014  . BREAST LUMPECTOMY WITH NEEDLE LOCALIZATION AND AXILLARY SENTINEL LYMPH NODE BX Left 12/31/2012   Procedure: NEEDLE LOCALIZATION LEFT BREAST LUMPECTOMY AND LEFT AXILLARY SENTENIAL LYMPH NODE BX;  Surgeon: Edward Jolly, MD;  Location: [place];  Service: General;  Laterality: Left;  . CESAREAN SECTION  1995/1996  . CRANIOTOMY Right 08/04/2017   Procedure: Right Frontal craniotomy for resection of tumor with stereotactic navigation;  Surgeon: [name], [name], MD;  Location: [place];  Service: Neurosurgery;  Laterality: Right;  Right Frontal craniotomy for resection of tumor with stereotactic navigation  . LUNG LOBECTOMY Left 05/1996   upper lobe - due to [facility] Fever  . PORT-A-CATH REMOVAL Right 08/02/2013   Procedure: REMOVAL PORT-A-CATH;  Surgeon: Edward Jolly, MD;  Location: [place];  Service: General;  Laterality: Right;  . PORTACATH PLACEMENT  07/02/2012     Procedure: INSERTION PORT-A-CATH;  Surgeon: Edward Jolly, MD;  Location: [place];  Service: General;  Laterality: N/A;  right    Social History   Socioeconomic History  . Marital status: Married    Spouse name: Not on file  . Number of children: 2  . Years of education: Not on file  . Highest education level: Not on file  Occupational History  . Occupation: Secondary school teacher  Social Needs  . Financial resource strain: Not on file  . Food insecurity:    Worry: Not on file    Inability: Not on file  . Transportation needs:    Medical: Not on file    Non-medical: Not on file  Tobacco Use  . Smoking status: Current Every Day Smoker    Packs/day: 0.75    Years: 24.00    Pack years: 18.00    Types: Cigarettes  . Smokeless tobacco: Never Used  Substance and Sexual Activity  . Alcohol use: No  . Drug use: No  . Sexual activity: Yes    Comment: trying to quit  Lifestyle  . Physical activity:    Days per week: Not on file    Minutes per session: Not on file  . Stress: Not on file  Relationships  . Social connections:    Talks on phone: Not on file    Gets together: Not on file    Attends religious service: Not on file    Active member of club or organization: Not on file    Attends meetings of clubs or organizations: Not on file    Relationship status: Not on file  . Intimate partner violence:    Fear of current or ex partner: No    Emotionally abused: No    Physically abused: No    Forced sexual activity: No  Other Topics Concern  . Not on file  Social History Narrative  . Not on file     Allergies  Allergen Reactions  . Aspirin Anaphylaxis and Shortness Of Breath    THROAT CLOSES  . Protonix [Pantoprazole] Nausea Only and Other (See Comments)    Also caused a "film in the mouth" and caused chest pressure  . Iodinated Diagnostic Agents Rash     Outpatient Medications Prior to Visit  Medication Sig Dispense Refill  .  acetaminophen-codeine (TYLENOL #3) 300-30 MG tablet Take 1 tablet by mouth every 4 (four) hours as needed (cough). 30 tablet 0  . albuterol (VENTOLIN HFA) 108 (90 BASE) MCG/ACT inhaler Inhale 2 puffs into the lungs every 6 (six) hours as needed. 1 Inhaler 3  . budesonide-formoterol (SYMBICORT) 80-4.5 MCG/ACT inhaler Inhale 2 puffs into the lungs 2 (two) times daily. 1 Inhaler 5  . fluconazole (DIFLUCAN) 200 MG tablet Take 2 tablets (400  mg total) by mouth daily. 60 tablet 1  . letrozole (FEMARA) 2.5 MG tablet Take 1 tablet (2.5 mg total) by mouth daily. 90 tablet 3  . levalbuterol (XOPENEX) 0.63 MG/3ML nebulizer solution Take 3 mLs (0.63 mg total) by nebulization every 4 (four) hours as needed for wheezing or shortness of breath. 540 mL 6  . naproxen sodium (ALEVE) 220 MG tablet Take 440 mg by mouth 2 (two) times daily as needed (headache).     . TYKERB 250 MG tablet TAKE 4 TABLETS (1,000 MG TOTAL) BY MOUTH DAILY. TAKE ON AN EMPTY STOMACH, 1 HOUR BEFORE OR 2 HOURS AFTER MEALS. 150 tablet 3   No facility-administered medications prior to visit.     Review of Systems  Constitutional: Negative for chills, fever, malaise/fatigue and weight loss.  HENT: Negative for hearing loss, sore throat and tinnitus.   Eyes: Negative for blurred vision and double vision.  Respiratory: Positive for cough, sputum production, shortness of breath and wheezing. Negative for hemoptysis and stridor.   Cardiovascular: Negative for chest pain, palpitations, orthopnea, leg swelling and PND.  Gastrointestinal: Negative for abdominal pain, constipation, diarrhea, heartburn, nausea and vomiting.  Genitourinary: Negative for dysuria, hematuria and urgency.  Musculoskeletal: Negative for joint pain and myalgias.  Skin: Negative for itching and rash.  Neurological: Negative for dizziness, tingling, weakness and headaches.  Endo/Heme/Allergies: Negative for environmental allergies. Does not bruise/bleed easily.    Psychiatric/Behavioral: Negative for depression. The patient is not nervous/anxious and does not have insomnia.   All other systems reviewed and are negative.    Objective:  Physical Exam  Constitutional: She is oriented to person, place, and time. She appears well-developed and well-nourished. No distress.  HENT:  Head: Normocephalic and atraumatic.  Mouth/Throat: Oropharynx is clear and moist.  Eyes: Pupils are equal, round, and reactive to light. Conjunctivae are normal. No scleral icterus.  Neck: Neck supple. No JVD present. No tracheal deviation present.  Cardiovascular: Normal rate, regular rhythm, normal heart sounds and intact distal pulses.  No murmur heard. Pulmonary/Chest: Effort normal. No accessory muscle usage or stridor. No tachypnea. No respiratory distress. She has wheezes. She has no rhonchi. She has rales.  Tubular breath sounds predominantly within the left chest  Abdominal: Soft. Bowel sounds are normal. She exhibits no distension. There is no tenderness.  Musculoskeletal: She exhibits no edema or tenderness.  Lymphadenopathy:    She has no cervical adenopathy.  Neurological: She is alert and oriented to person, place, and time.  Skin: Skin is warm and dry. Capillary refill takes less than 2 seconds. No rash noted.  Psychiatric: She has a normal mood and affect. Her behavior is normal.  Vitals reviewed.    Vitals:   05/16/18 1453  BP: 108/70  Pulse: 80  SpO2: 95%  Weight: 108 lb 14.4 oz (49.4 kg)  Height: 5\' 2"  (1.575 m)   95% on RA. BMI Readings from Last 3 Encounters:  05/16/18 19.92 kg/m  05/06/18 19.94 kg/m  05/02/18 20.01 kg/m   Wt Readings from Last 3 Encounters:  05/16/18 108 lb 14.4 oz (49.4 kg)  05/06/18 109 lb (49.4 kg)  05/02/18 109 lb 6.4 oz (49.6 kg)     CBC    Component Value Date/Time   WBC 10.5 04/11/2018 1706   RBC 3.71 (L) 04/11/2018 1706   HGB 10.9 (L) 04/11/2018 1706   HGB 11.2 (L) 03/26/2018 1507   HGB 13.1  08/12/2014 0955   HCT 32.8 (L) 04/11/2018 1706   HCT 40.4  08/12/2014 0955   PLT 458.0 (H) 04/11/2018 1706   PLT 378 03/26/2018 1507   PLT 228 08/12/2014 0955   MCV 88.6 04/11/2018 1706   MCV 98.2 08/12/2014 0955   MCH 29.7 03/26/2018 1507   MCHC 33.2 04/11/2018 1706   RDW 15.5 04/11/2018 1706   RDW 13.0 08/12/2014 0955   LYMPHSABS 2.6 04/11/2018 1706   LYMPHSABS 2.0 08/12/2014 0955   MONOABS 0.7 04/11/2018 1706   MONOABS 0.5 08/12/2014 0955   EOSABS 1.3 (H) 04/11/2018 1706   EOSABS 0.2 08/12/2014 0955   BASOSABS 0.1 04/11/2018 1706   BASOSABS 0.0 08/12/2014 0955    Chest Imaging: 04/26/2018: CT imaging Left upper lobe cavity, cystic bronchiectasis thick secretions. Subcarinal adenopathy The patient's images have been independently reviewed by me.    Pulmonary Functions Testing Results: PFT Results Latest Ref Rng & Units 08/09/2016  FVC-Pre L 2.79  FVC-Predicted Pre % 83  FVC-Post L 2.92  FVC-Predicted Post % 87  Pre FEV1/FVC % % 69  Post FEV1/FCV % % 70  FEV1-Pre L 1.94  FEV1-Predicted Pre % 73  DLCO UNC% % 46  DLCO COR %Predicted % 55  TLC L 4.75  TLC % Predicted % 100  RV % Predicted % 124    FeNO: None   Pathology: None   Echocardiogram:  09/18/2017: EF 60-65%, G1d  Heart Catheterization: None    Assessment & Plan:   Cavitary lesion of lung in area of previous cyts in apex of Sup Segment of LLL  Malignant neoplasm of upper-inner quadrant of left breast in female, estrogen receptor positive ([place])  Brain metastasis (H[facility])  H/O coccidioidomycosis  Obstructive bronchiectasis ([facility])  Mediastinal adenopathy  Discussion:  We will plan for EBUS bronchoscopy with biopsy of the subcarinal lymphadenopathy due to the patient's history of metastatic breast cancer.  In addition we will send the tissue for culture.  We will also complete washings of the left upper lobe as well as fluoroscopic guided micro brushes as well as bronchioloalveolar lavage for  culture.  Additionally we can send samples to the [facility] microbiology lab.  We discussed the risk, versus benefits versus alternatives to bronchoscopy today in clinic.  Patient has agreed and elected to proceed with outpatient bronchoscopy.  We will plan for outpatient bronchoscopy sometime next week.  Staff to follow-up with scheduling.  Return to clinic following bronchoscopy  Greater than 50% of this patient's 60-minute office visit was spent face-to-face discussing the above diagnostic treatment plan and risk/benefits/alternatives to bronchoscopy.   Current Outpatient Medications:  .  acetaminophen-codeine (TYLENOL #3) 300-30 MG tablet, Take 1 tablet by mouth every 4 (four) hours as needed (cough)., Disp: 30 tablet, Rfl: 0 .  albuterol (VENTOLIN HFA) 108 (90 BASE) MCG/ACT inhaler, Inhale 2 puffs into the lungs every 6 (six) hours as needed., Disp: 1 Inhaler, Rfl: 3 .  budesonide-formoterol (SYMBICORT) 80-4.5 MCG/ACT inhaler, Inhale 2 puffs into the lungs 2 (two) times daily., Disp: 1 Inhaler, Rfl: 5 .  fluconazole (DIFLUCAN) 200 MG tablet, Take 2 tablets (400 mg total) by mouth daily., Disp: 60 tablet, Rfl: 1 .  letrozole (FEMARA) 2.5 MG tablet, Take 1 tablet (2.5 mg total) by mouth daily., Disp: 90 tablet, Rfl: 3 .  levalbuterol (XOPENEX) 0.63 MG/3ML nebulizer solution, Take 3 mLs (0.63 mg total) by nebulization every 4 (four) hours as needed for wheezing or shortness of breath., Disp: 540 mL, Rfl: 6 .  naproxen sodium (ALEVE) 220 MG tablet, Take 440 mg by mouth 2 (  two) times daily as needed (headache). , Disp: , Rfl:  .  TYKERB 250 MG tablet, TAKE 4 TABLETS (1,000 MG TOTAL) BY MOUTH DAILY. TAKE ON AN EMPTY STOMACH, 1 HOUR BEFORE OR 2 HOURS AFTER MEALS., Disp: 150 tablet, Rfl: 3   Garner Nash, DO [facility] 05/16/2018 2:56 PM

## 2018-05-16 NOTE — Patient Instructions (Addendum)
Thank you for visiting Dr. Valeta Harms at [facility]. Today we recommend the following: Orders Placed This Encounter  Procedures  . Ambulatory referral to Pulmonology   RTC the last week of November.    We are moving our office in November. The new address will be: [address] [employer] Phone: [phone]

## 2018-05-17 ENCOUNTER — Telehealth: Payer: Self-pay | Admitting: Pulmonary Disease

## 2018-05-17 NOTE — Telephone Encounter (Signed)
PCCM:  Yes she can still go to work.   [facility] 05/17/2018 4:30 PM

## 2018-05-17 NOTE — Telephone Encounter (Signed)
Spoke with pt, she wanted to know if she should be at work since she has a lymph node enlarged near her heart. She is afraid of getting sick again and states she works around [employer] for 9 hours a day. Dr. Valeta Harms please advise on her work status.  Patient Instructions by Vivia Ewing, LPN at 50/11/6977 4:80 PM  Author: Vivia Ewing, LPN Author Type: Licensed Practical Nurse Filed: 05/16/2018 3:22 PM  Note Status: Addendum Cosign: Cosign Not Required Encounter Date: 05/16/2018  Editor: Vivia Ewing, LPN (Licensed Practical Nurse)  Prior Versions: 1. Vivia Ewing, LPN (Licensed Chiropractor) at 05/16/2018 3:21 PM - Signed    Thank you for visiting Dr. Valeta Harms at [facility]. Today we recommend the following:    Orders Placed This Encounter  Procedures  . Ambulatory referral to Pulmonology   RTC the last week of November.    We are moving our office in November. The new address will be: [address] [employer] Phone: [phone]

## 2018-05-17 NOTE — Telephone Encounter (Signed)
Called and spoke to patient, made aware of BI recommendations. Voiced understanding. Nothing further is needed at this time.

## 2018-05-18 ENCOUNTER — Ambulatory Visit
Admission: RE | Admit: 2018-05-18 | Discharge: 2018-05-18 | Disposition: A | Discharge status: Home / Self Care | Payer: 59 | Source: Ambulatory Visit | Attending: Radiation Oncology | Admitting: Radiation Oncology

## 2018-05-18 DIAGNOSIS — C7931 Secondary malignant neoplasm of brain: Secondary | ICD-10-CM

## 2018-05-18 DIAGNOSIS — C7949 Secondary malignant neoplasm of other parts of nervous system: Principal | ICD-10-CM

## 2018-05-18 MED ORDER — GADOBENATE DIMEGLUMINE 529 MG/ML IV SOLN
10.0000 mL | Freq: Once | INTRAVENOUS | Status: AC | PRN
  Administered 2018-05-18: 10 mL via INTRAVENOUS

## 2018-05-21 ENCOUNTER — Other Ambulatory Visit: Payer: 59

## 2018-05-21 ENCOUNTER — Ambulatory Visit: Payer: 59 | Admitting: Internal Medicine

## 2018-05-21 NOTE — Pre-Procedure Instructions (Signed)
Devony Mcgrady Coy  05/21/2018      [id number] [address], [place] [address] Phone: [phone] Fax: [phone]  [place], [place] [address] [place] [address] Phone: [phone] Fax: [phone]  [facility] [place], [place] [address] [place] [address] Phone: [phone] Fax: [phone]    Your procedure is scheduled on May 23, 2018.  Report to [facility] Admitting at 830 AM.  Call this number if you have problems the morning of surgery:  [phone]   Remember:  Do not eat or drink after midnight.    Take these medicines the morning of surgery with A SIP OF WATER  Tylenol #3-if needed Albuterol (ventolin) inhaler-if needed Symbicort inhaler Fluconazole (diflucan) Letrozole (femara) Xopenex nebulizer-if needed  7 days prior to surgery STOP taking any Aspirin (unless otherwise instructed by your surgeon), Aleve, Naproxen, Ibuprofen, Motrin, Advil, Goody's, BC's, all herbal medications, fish oil, and all vitamins     Do not wear jewelry, make-up or nail polish.  Do not wear lotions, powders, or perfumes, or deodorant.  Do not shave 48 hours prior to surgery.   Do not bring valuables to the hospital.  [facility] is not responsible for any belongings or valuables.  Contacts, dentures or bridgework may not be worn into surgery.  Leave your suitcase in the car.  After surgery it may be brought to your room.  For patients admitted to the hospital, discharge time will be determined by your treatment team.  Patients discharged the day of surgery will not be allowed to drive home.    [place]- Preparing For Surgery  Before surgery, you can play an important role. Because skin is not  sterile, your skin needs to be as free of germs as possible. You can reduce the number of germs on your skin by washing with CHG (chlorahexidine gluconate) Soap before surgery.  CHG is an antiseptic cleaner which kills germs and bonds with the skin to continue killing germs even after washing.    Oral Hygiene is also important to reduce your risk of infection.  Remember - BRUSH YOUR TEETH THE MORNING OF SURGERY WITH YOUR REGULAR TOOTHPASTE  Please do not use if you have an allergy to CHG or antibacterial soaps. If your skin becomes reddened/irritated stop using the CHG.  Do not shave (including legs and underarms) for at least 48 hours prior to first CHG shower. It is OK to shave your face.  Please follow these instructions carefully.   1. Shower the NIGHT BEFORE SURGERY and the MORNING OF SURGERY with CHG.   2. If you chose to wash your hair, wash your hair first as usual with your normal shampoo.  3. After you shampoo, rinse your hair and body thoroughly to remove the shampoo.  4. Use CHG as you would any other liquid soap. You can apply CHG directly to the skin and wash gently with a scrungie or a clean washcloth.   5. Apply the CHG Soap to your body ONLY FROM THE NECK DOWN.  Do not use on open wounds or open sores. Avoid contact with your eyes, ears, mouth and genitals (private parts). Wash Face and genitals (private parts)  with  your normal soap.  6. Wash thoroughly, paying special attention to the area where your surgery will be performed.  7. Thoroughly rinse your body with warm water from the neck down.  8. DO NOT shower/wash with your normal soap after using and rinsing off the CHG Soap.  9. Pat yourself dry with a CLEAN TOWEL.  10. Wear CLEAN PAJAMAS to bed the night before surgery, wear comfortable clothes the morning of surgery  11. Place CLEAN SHEETS on your bed the night of your first shower and DO NOT SLEEP WITH PETS.  Day of Surgery:  Do not apply any  deodorants/lotions.  Please wear clean clothes to the hospital/surgery center.   Remember to brush your teeth WITH YOUR REGULAR TOOTHPASTE.   Please read over the fact sheets that you were given.

## 2018-05-22 ENCOUNTER — Encounter (HOSPITAL_COMMUNITY)
Admission: RE | Admit: 2018-05-22 | Discharge: 2018-05-22 | Disposition: A | Discharge status: Home / Self Care | Payer: No Typology Code available for payment source | Source: Ambulatory Visit | Attending: Pulmonary Disease | Admitting: Pulmonary Disease

## 2018-05-22 ENCOUNTER — Ambulatory Visit
Admission: RE | Admit: 2018-05-22 | Discharge: 2018-05-22 | Disposition: A | Discharge status: Home / Self Care | Payer: 59 | Source: Ambulatory Visit | Attending: Urology | Admitting: Urology

## 2018-05-22 ENCOUNTER — Other Ambulatory Visit: Payer: Self-pay

## 2018-05-22 ENCOUNTER — Encounter (HOSPITAL_COMMUNITY): Payer: Self-pay

## 2018-05-22 ENCOUNTER — Encounter: Payer: Self-pay | Admitting: Urology

## 2018-05-22 VITALS — BP 116/53 | HR 72 | Temp 98.5°F | Ht 62.0 in | Wt 111.4 lb

## 2018-05-22 DIAGNOSIS — C7931 Secondary malignant neoplasm of brain: Secondary | ICD-10-CM | POA: Diagnosis not present

## 2018-05-22 DIAGNOSIS — J479 Bronchiectasis, uncomplicated: Secondary | ICD-10-CM | POA: Diagnosis not present

## 2018-05-22 DIAGNOSIS — Z01812 Encounter for preprocedural laboratory examination: Secondary | ICD-10-CM

## 2018-05-22 DIAGNOSIS — Z853 Personal history of malignant neoplasm of breast: Secondary | ICD-10-CM | POA: Diagnosis not present

## 2018-05-22 DIAGNOSIS — Z7951 Long term (current) use of inhaled steroids: Secondary | ICD-10-CM | POA: Diagnosis not present

## 2018-05-22 DIAGNOSIS — R590 Localized enlarged lymph nodes: Secondary | ICD-10-CM | POA: Diagnosis not present

## 2018-05-22 DIAGNOSIS — Z923 Personal history of irradiation: Secondary | ICD-10-CM | POA: Insufficient documentation

## 2018-05-22 DIAGNOSIS — Z79899 Other long term (current) drug therapy: Secondary | ICD-10-CM | POA: Diagnosis not present

## 2018-05-22 DIAGNOSIS — Z902 Acquired absence of lung [part of]: Secondary | ICD-10-CM | POA: Diagnosis not present

## 2018-05-22 DIAGNOSIS — Z9221 Personal history of antineoplastic chemotherapy: Secondary | ICD-10-CM | POA: Diagnosis not present

## 2018-05-22 DIAGNOSIS — F17210 Nicotine dependence, cigarettes, uncomplicated: Secondary | ICD-10-CM | POA: Diagnosis not present

## 2018-05-22 DIAGNOSIS — R918 Other nonspecific abnormal finding of lung field: Secondary | ICD-10-CM | POA: Diagnosis present

## 2018-05-22 HISTORY — DX: Dyspnea, unspecified: R06.00

## 2018-05-22 HISTORY — DX: Pneumonia, unspecified organism: J18.9

## 2018-05-22 LAB — COMPREHENSIVE METABOLIC PANEL
ALK PHOS: 124 U/L (ref 38–126)
ALT: 25 U/L (ref 0–44)
AST: 25 U/L (ref 15–41)
Albumin: 3.2 g/dL — ABNORMAL LOW (ref 3.5–5.0)
Anion gap: 8 (ref 5–15)
BUN: 6 mg/dL (ref 6–20)
CHLORIDE: 103 mmol/L (ref 98–111)
CO2: 27 mmol/L (ref 22–32)
CREATININE: 0.79 mg/dL (ref 0.44–1.00)
Calcium: 9.5 mg/dL (ref 8.9–10.3)
GFR calc Af Amer: >60 mL/min (ref >60)
GFR calc non Af Amer: >60 mL/min (ref >60)
Glucose, Bld: 89 mg/dL (ref 70–99)
Potassium: 4.2 mmol/L (ref 3.5–5.1)
SODIUM: 138 mmol/L (ref 135–145)
Total Bilirubin: 0.4 mg/dL (ref 0.3–1.2)
Total Protein: 7.5 g/dL (ref 6.5–8.1)

## 2018-05-22 LAB — CBC
HCT: 35 % — ABNORMAL LOW (ref 36.0–46.0)
HEMOGLOBIN: 11 g/dL — AB (ref 12.0–15.0)
MCH: 28 pg (ref 26.0–34.0)
MCHC: 31.4 g/dL (ref 30.0–36.0)
MCV: 89.1 fL (ref 80.0–100.0)
NRBC: 0 % (ref 0.0–0.2)
PLATELETS: 429 10*3/uL — AB (ref 150–400)
RBC: 3.93 MIL/uL (ref 3.87–5.11)
RDW: 15.8 % — ABNORMAL HIGH (ref 11.5–15.5)
WBC: 9.9 10*3/uL (ref 4.0–10.5)

## 2018-05-22 LAB — PROTIME-INR
INR: 0.95
Prothrombin Time: 12.6 seconds (ref 11.4–15.2)

## 2018-05-22 LAB — APTT: APTT: 29 s (ref 24–36)

## 2018-05-22 NOTE — Progress Notes (Signed)
Radiation Oncology         [phone] ________________________________  Name: Melissa [name] MRN: [id number]  Date: 05/22/2018  DOB: 05-13-1966  Post Treatment Note  CC: Patient, [name]  [name], Melissa [name], *  Diagnosis:   52 y.o. female with  brain metastasis, ER PR positive, HER-2 positive, from invasive ductal carcinoma of the left breast.  Interval Since Last Radiation:  5 month   12/18/2017:   PTV2: 4 mm Rt Parietal lesion treated to 20 Gy in 1 Fx  08/25/2017, 08/28/2017, 08/30/2017, 09/01/2017, 09/04/2017: PTV1: post op SRS to right frontal lobe resection cavity  Narrative:  The patient returns today for routine follow-up. She tolerated her most recent [place] treatment relatively well and was discharged home in stable condition and without complaints.  In summary, she initially presented to the emergency Department on 07/27/2017 with complaints of headaches ongoing for the past 5 weeks with associated nausea and vomiting. She was initially treated by her PCP for sinusitis without improvement.  She also has a history of migraine headaches and had ben evaluated in the ED at [facility] on 06/20/2017 for treatment of what she thought was a typical migraine headache given the fact that she had her usual blurry vision and aura preceding the headache. She reports that the headache did improve with treatment but the relief was short-lived as the headache returned the very next day. She followed up with her primary care physician and a CT of the head was going to be scheduled due to the persistent headaches but in the interim, she presented to the Emergency Department at [facility] due to increased severity of the headache with associated nausea and vomiting. The headaches were occuring in the frontal lobe as well as bilateral temporal with occasional radiation to the occipital lobe and associated with blurry vision, decreased appetite, fatigue, nausea and vomiting.  She denied any difficulty  with speech, memory, imbalance or focal weakness.  CT Head was performed on admission 07/27/2017 which showed a large right frontal lobe mass measuring 3.1 by 2.5 cm which was felt to potentially represent a primary CNS neoplasm or focus of metastatic disease. There was significant surrounding edema with mass effect and right to left midline shift measuring 8 mm. A subsequent Brain MRI showed a 3.4 x 2.9 x 2.9 cm right frontal lobe mass with imaging characteristics of solitary metastasis and extensive vasogenic edema resulting in 9 mm right to left midline shift as well as equivocal very early left ventricle entrapment.     CT Chest, Abdomen, Pelvis on 07/28/2017 for disease staging showed no findings to suggest metastatic breast cancer involving the chest, abdomen, pelvis or osseous structures. 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 Surgical changes involving the left breast and left axilla but no findings for local recurrence or regional adenopathy.  She proceeded with a gross total resection of the frontal lesion on 08/05/17 with Dr. Cyndy [name] followed by adjuvant SRS radiotherapy to the resection cavity in February 2019. Final surgical pathology revealed poorly differentiated adenocarcinoma consistent with metastatic breast cancer, ER/PR positive and HER-2 positive.   She tolerated her initial SRS treatment well and initial post treatment MRI brain on 12/07/17 showed satisfactory appearance of the right anterior frontal lobe post treatment site with expected evolution but unfortunately also showed a single, new 2 to 3 mm lesion in the right parietal lobe without associated edema or mass-effect.  She elected to undergo salvage  SRS treatment to the new lesion in the parietal lobe which was completed on 12/18/17 and tolerated well.    Interval History:   She had recent follow up imaging with brain MRI on 05/18/18 which shows a stable right frontal  resection cavity with stable 4 mm nodular enhancement at the inferior cavity margin.  No evidence of new disease.    On review of systems, the patient states that she is doing well overall.  She is without complaints aside from mild residual fatigue and chronic cough.  She has noticed improved energy levels and has been able to continue to work.  She is being worked up with pulmonology for persistent chest congestion, thick green/yellow sputum and pleuritic chest discomfort on the left unrelieved with multiple rounds of antibiotics.  She is scheduled for a bronchoscopy procedure for biopsy tomorrow with Dr. 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  She denies headaches, decreased visual or auditory acuity, tinnitus, tremor, focal weakness or seizure activity. She is not having difficulty with speech or word finding.  She denies abdominal pain, N/V or diarrhea.  She remains on Letrozole and immunotherapy with Lapatinib and is tolerating this well. She reports a healthy appetite and is steadily gaining her weight back which she is pleased with.  ALLERGIES:  is allergic to aspirin; protonix [pantoprazole]; and iodinated diagnostic agents.  Meds: Current Outpatient Medications  Medication Sig Dispense Refill  . acetaminophen-codeine (TYLENOL #3) 300-30 MG tablet Take 1 tablet by mouth every 4 (four) hours as needed (cough). 30 tablet 0  . albuterol (VENTOLIN HFA) 108 (90 BASE) MCG/ACT inhaler Inhale 2 puffs into the lungs every 6 (six) hours as needed. 1 Inhaler 3  . budesonide-formoterol (SYMBICORT) 80-4.5 MCG/ACT inhaler Inhale 2 puffs into the lungs 2 (two) times daily. 1 Inhaler 5  . fluconazole (DIFLUCAN) 200 MG tablet Take 2 tablets (400 mg total) by mouth daily. 60 tablet 1  . letrozole (FEMARA) 2.5 MG tablet Take 1 tablet (2.5 mg total) by mouth daily. 90 tablet 3  . levalbuterol (XOPENEX) 0.63 MG/3ML nebulizer solution Take 3 mLs (0.63 mg  total) by nebulization every 4 (four) hours as needed for wheezing or shortness of breath. 540 mL 6  . naproxen sodium (ALEVE) 220 MG tablet Take 440 mg by mouth 2 (two) times daily as needed (headache).     . TYKERB 250 MG tablet TAKE 4 TABLETS (1,000 MG TOTAL) BY MOUTH DAILY. TAKE ON AN EMPTY STOMACH, 1 HOUR BEFORE OR 2 HOURS AFTER MEALS. 150 tablet 3   No current facility-administered medications for this encounter.     Physical Findings:  height is '5\' 2"'$  (1.575 m) and weight is 111 lb 6.4 oz (50.5 kg). Her oral temperature is 98.5 F (36.9 C). Her blood pressure is 116/53 (abnormal) and her pulse is 72. Her oxygen saturation is 100%.  Pain Assessment Pain Score: 0-No pain/10 In general this is a well appearing caucasian female in no acute distress. She's alert and oriented x4 and appropriate throughout the examination. Cardiopulmonary assessment is negative for acute distress and she exhibits normal effort. She appears grossly neurologically intact with intact EOMs bilaterally and PERRLA.  Speech is fluent and appropriate. Sensation is intact to light touch in the upper and lower extremities and strenght is 5/5 and equal bilaterally in upper and lower extremities.   Lab Findings: Lab Results  Component Value Date   WBC 9.9 05/22/2018   HGB 11.0 (  L) 05/22/2018   HCT 35.0 (L) 05/22/2018   MCV 89.1 05/22/2018   PLT 429 (H) 05/22/2018     Radiographic Findings: Dg Chest 2 View  Result Date: 05/06/2018 CLINICAL DATA:  Left chest pain, unknown mass, on antifungal medication EXAM: CHEST - 2 VIEW COMPARISON:  Chest radiographs dated 05/02/2018. CT chest dated 04/26/2017. FINDINGS: Status post left upper lobectomy. Cavitary lesion in the lateral left upper hemithorax (upper aspect of the left lower lobe), better evaluated on prior CT. Additional nodular opacities in the left lower lobe. Right lung is clear. The heart is normal in size. Visualized osseous structures are within normal limits.  Surgical clips in the left chest wall/axilla. IMPRESSION: Status post left upper lobectomy. Thick-walled cavitary lesion infected fluid mid superiorly in the left lower lobe. Additional nodular opacities in the left lower lobe. These findings are better evaluated on prior CT. Electronically Signed   By: Julian Hy M.D.   On: 05/06/2018 12:16   Dg Chest 2 View  Result Date: 05/02/2018 CLINICAL DATA:  Cough, history of lung nodule EXAM: CHEST - 2 VIEW COMPARISON:  CT 04/26/2017, radiograph 04/20/2018, 04/11/2017, 02/28/2017 FINDINGS: Right lung is clear. No pleural effusion. Pleural and parenchymal cavitary process in the left upper lobe appears similar radiographically. Hilar retraction on the left. More nodular and cystic disease in the left lower lobe, likely corresponds to recent CT demonstrated bronchiectasis and associated secretions or mucoid impaction. Normal heart size. IMPRESSION: 1. Pleural and cavitary parenchymal process in the left upper lobe appears grossly stable radiographically. 2. Increased slightly nodular opacity in the left lower lobe, likely corresponds to recent CT demonstrated bronchiectasis with superimposed bronchial thickening and secretions/mucoid impaction. Electronically Signed   By: Donavan Foil M.D.   On: 05/02/2018 16:37   Ct Chest Wo Contrast  Result Date: 04/26/2018 CLINICAL DATA:  LUL lobectomy in 1997 due to [facility] fever. New lung mass on recent surveillance CT. Hx of Lt breast ca in 2014. Brain tumor with surgical resection in Jan 2019. 2nd brain tumor in May treated with radiation. Now new lung mass. CP and SOB EXAM: CT CHEST WITHOUT CONTRAST TECHNIQUE: Multidetector CT imaging of the chest was performed following the standard protocol without IV contrast. COMPARISON:  Chest radiographs, 04/20/2018. Chest CTs, 03/22/2018, 01/18/2018 and 07/28/2017. FINDINGS: Cardiovascular: Heart is normal in size and configuration. No pericardial effusion. No coronary artery  calcifications. Great vessels are normal in caliber. No aortic atherosclerosis. Mediastinum/Nodes: No neck base or axillary masses enlarged lymph nodes. There is a 20 x 13 mm left subcarinal lymph node. On most recent prior exam this measured 18 x 12 mm, measuring along the same axes. It was considerably smaller on CT dated 01/18/2018. No other enlarged mediastinal lymph nodes. There are surgical vascular clips lie gating the left upper lobe bronchus. No enlarged hilar lymph nodes. Trachea is normal in caliber and widely patent. Esophagus is unremarkable. Lungs/Pleura: Pleural base consolidation and cystic spaces lateral left upper lobe has mildly improved, measuring less and its right to left dimension than on the prior exam. However, there are other areas of new lung opacity. There are irregular nodular opacities that lie inferior to the pleural-based left upper lobe consolidation which were not present previously. These peribronchovascular irregular nodules which extend to area pleural based consolidation. There are new areas peribronchovascular opacity posteriorly, posterior to the left hilum the posterolateral left lower lobe the level of the branch point of the segmental bronchi. Cystic bronchiectasis the more inferior left lower lobe  now complicated by dependent secretions. Right lung stable. There is a 5 mm nodule in the right lower lobe, image 77, series 3 unchanged from most recent prior exam. Mild areas of bronchiectasis with a few minor upper lobe foci of emphysema. Upper Abdomen: No significant abnormality. Musculoskeletal: No fracture or acute finding. No osteoblastic or osteolytic lesions. IMPRESSION: 1. Overall, there has been interval worsening in lung disease on the left compared to the most recent prior exam. Although the primary area of pleural-based consolidation and cystic change along the upper lateral left lung has decreased in its right to left thickness, new areas of peribronchovascular  opacity have developed as described above. Dependent mucus/secretions are now noted in the areas of cystic bronchiectasis in the lower aspect of the left lung. 2. There are no acute findings in the right lung. Small stable right lower lobe nodule. Mild changes of upper lobe centrilobular emphysema. 3. Left subcarinal lymph node has slightly increased in size from most recent prior CT, more significantly increased in size from the CT dated 07/28/2017. This is most likely reactive to the ongoing left lung inflammation. Emphysema (ICD10-J43.9). Electronically Signed   By: Lajean Manes M.D.   On: 04/26/2018 21:32   Mr Jeri Cos CX Contrast  Result Date: 05/18/2018 CLINICAL DATA:  52 y/o F; history of breast cancer with metastasis to the brain. Three-month follow-up. SRS protocol. Tumor resection 08/04/2017. EXAM: MRI HEAD WITHOUT AND WITH CONTRAST TECHNIQUE: Multiplanar, multiecho pulse sequences of the brain and surrounding structures were obtained without and with intravenous contrast. CONTRAST:  26m MULTIHANCE GADOBENATE DIMEGLUMINE 529 MG/ML IV SOLN COMPARISON:  02/16/2018, 12/07/2017, 08/23/2017 MRI of the head. FINDINGS: Brain: Right frontal resection cavity is stable in size in comparison with the prior MRI of the head. There are internal foci of T1 shortening and susceptibility hypointensity compatible blood products. The inferior margin of the cavity is a 4 mm focus of nodular enhancement (series 11, image 79 and series 13, image 18). Stable surrounding T2 FLAIR signal abnormality in white matter. No new focus of abnormal enhancement, mass effect, T2 FLAIR signal abnormality, reduced diffusion, or intracranial hemorrhage. Stable nonspecific T2 FLAIR hyperintensities predominantly in bifrontal subcortical white matter. Stable mild volume loss of the brain. Stable smooth dural enhancement along the anterior falx and over the right frontal convexity, likely postsurgical. No hydrocephalus, extra-axial  collection, or herniation. Vascular: Normal flow voids. Skull and upper cervical spine: Chronic postsurgical changes related to frontal craniotomy. Sinuses/Orbits: Negative. Other: None. IMPRESSION: 1. Right frontal resection cavity with stable indeterminate 4 mm nodular enhancement at the inferior cavity margin. Continued attention at follow-up recommended. 2. No acute process or new metastasis identified. Electronically Signed   By: LKristine GarbeM.D.   On: 05/18/2018 14:04    Impression/Plan: 1. 52y.o. female with  brain metastasis, ER PR positive, HER-2 positive, from invasive ductal carcinoma of the left breast. She appears to have [facility] well [facility]  She is currently without CNS complaints.  We will plan to repeat an MRI of the brain in 3 months time to assess treatment response and monitor for any disease progression.  Her case and imaging will be reviewed at the multidisciplinary brain conference prior to a follow-up visit in our office to review results and recommendations.  She knows to call immediately for any new or progressive neurologic symptoms or questions or concerns related to her previous radiotherapy.  She appears to have a good understanding of her disease and  our recommendations and is comfortable and in agreement with the current plan as stated.    Nicholos Johns, PA-C

## 2018-05-22 NOTE — Progress Notes (Signed)
PCP - doesn't have one at the moment Pulmonary is Dr. Melvyn Novas Oncologist is Dr. Lindi Adie  LOV 03/2018 Did live in [place], and back in 1996, with either a bronch or endoscopy, she had issues with hypotension. Has had numerous surgeries since with no problem Denies murmur, cp, sob. Did have chemo & radiation in 2014-2015.  Had to have cardiac w/u then EKG 04/2018 Echo 2015 Stress 2015.

## 2018-05-23 ENCOUNTER — Ambulatory Visit (HOSPITAL_COMMUNITY): Payer: No Typology Code available for payment source

## 2018-05-23 ENCOUNTER — Encounter (HOSPITAL_COMMUNITY)
Admission: RE | Disposition: A | Discharge status: Home / Self Care | Payer: Self-pay | Source: Ambulatory Visit | Attending: Pulmonary Disease

## 2018-05-23 ENCOUNTER — Ambulatory Visit (HOSPITAL_COMMUNITY): Payer: No Typology Code available for payment source | Admitting: Certified Registered Nurse Anesthetist

## 2018-05-23 ENCOUNTER — Encounter (HOSPITAL_COMMUNITY): Payer: Self-pay

## 2018-05-23 ENCOUNTER — Ambulatory Visit (HOSPITAL_COMMUNITY)
Admission: RE | Admit: 2018-05-23 | Discharge: 2018-05-23 | Disposition: A | Discharge status: Home / Self Care | Payer: No Typology Code available for payment source | Source: Ambulatory Visit | Attending: Pulmonary Disease | Admitting: Pulmonary Disease

## 2018-05-23 ENCOUNTER — Ambulatory Visit (HOSPITAL_COMMUNITY): Payer: No Typology Code available for payment source | Admitting: Physician Assistant

## 2018-05-23 DIAGNOSIS — R918 Other nonspecific abnormal finding of lung field: Secondary | ICD-10-CM | POA: Insufficient documentation

## 2018-05-23 DIAGNOSIS — Z923 Personal history of irradiation: Secondary | ICD-10-CM | POA: Insufficient documentation

## 2018-05-23 DIAGNOSIS — F17210 Nicotine dependence, cigarettes, uncomplicated: Secondary | ICD-10-CM | POA: Insufficient documentation

## 2018-05-23 DIAGNOSIS — Z9889 Other specified postprocedural states: Secondary | ICD-10-CM

## 2018-05-23 DIAGNOSIS — C7931 Secondary malignant neoplasm of brain: Secondary | ICD-10-CM | POA: Diagnosis present

## 2018-05-23 DIAGNOSIS — Z7951 Long term (current) use of inhaled steroids: Secondary | ICD-10-CM | POA: Insufficient documentation

## 2018-05-23 DIAGNOSIS — Z902 Acquired absence of lung [part of]: Secondary | ICD-10-CM | POA: Insufficient documentation

## 2018-05-23 DIAGNOSIS — R590 Localized enlarged lymph nodes: Secondary | ICD-10-CM

## 2018-05-23 DIAGNOSIS — J479 Bronchiectasis, uncomplicated: Secondary | ICD-10-CM | POA: Insufficient documentation

## 2018-05-23 DIAGNOSIS — Z853 Personal history of malignant neoplasm of breast: Secondary | ICD-10-CM | POA: Insufficient documentation

## 2018-05-23 DIAGNOSIS — Z419 Encounter for procedure for purposes other than remedying health state, unspecified: Secondary | ICD-10-CM

## 2018-05-23 DIAGNOSIS — Z9221 Personal history of antineoplastic chemotherapy: Secondary | ICD-10-CM | POA: Insufficient documentation

## 2018-05-23 DIAGNOSIS — J984 Other disorders of lung: Secondary | ICD-10-CM | POA: Diagnosis not present

## 2018-05-23 DIAGNOSIS — C50212 Malignant neoplasm of upper-inner quadrant of left female breast: Secondary | ICD-10-CM | POA: Diagnosis not present

## 2018-05-23 DIAGNOSIS — Z79899 Other long term (current) drug therapy: Secondary | ICD-10-CM | POA: Insufficient documentation

## 2018-05-23 HISTORY — PX: VIDEO BRONCHOSCOPY WITH ENDOBRONCHIAL ULTRASOUND: SHX6177

## 2018-05-23 HISTORY — DX: Bronchiectasis, uncomplicated: J47.9

## 2018-05-23 LAB — BODY FLUID CELL COUNT WITH DIFFERENTIAL
EOS FL: 7 %
Lymphs, Fluid: 10 %
Monocyte-Macrophage-Serous Fluid: 29 % — ABNORMAL LOW (ref 50–90)
Neutrophil Count, Fluid: 54 % — ABNORMAL HIGH (ref 0–25)
Total Nucleated Cell Count, Fluid: 155 cu mm (ref 0–1000)

## 2018-05-23 SURGERY — BRONCHOSCOPY, WITH EBUS
Anesthesia: General | Site: Chest

## 2018-05-23 MED ORDER — ROCURONIUM BROMIDE 10 MG/ML (PF) SYRINGE
PREFILLED_SYRINGE | INTRAVENOUS | Status: DC | PRN
  Administered 2018-05-23: 50 mg via INTRAVENOUS

## 2018-05-23 MED ORDER — SUGAMMADEX SODIUM 200 MG/2ML IV SOLN
INTRAVENOUS | Status: DC | PRN
  Administered 2018-05-23: 99.8 mg via INTRAVENOUS

## 2018-05-23 MED ORDER — FENTANYL CITRATE (PF) 100 MCG/2ML IJ SOLN: 25.0000 ug | INTRAMUSCULAR | Status: DC | PRN

## 2018-05-23 MED ORDER — DEXAMETHASONE SODIUM PHOSPHATE 10 MG/ML IJ SOLN
INTRAMUSCULAR | Status: DC | PRN
  Administered 2018-05-23: 10 mg via INTRAVENOUS

## 2018-05-23 MED ORDER — ALBUTEROL SULFATE HFA 108 (90 BASE) MCG/ACT IN AERS
INHALATION_SPRAY | RESPIRATORY_TRACT | Status: DC | PRN
  Administered 2018-05-23 (×2): 2 via RESPIRATORY_TRACT

## 2018-05-23 MED ORDER — FENTANYL CITRATE (PF) 250 MCG/5ML IJ SOLN
INTRAMUSCULAR | Status: AC
  Filled 2018-05-23: qty 5

## 2018-05-23 MED ORDER — MIDAZOLAM HCL 2 MG/2ML IJ SOLN: 0.5000 mg | Freq: Once | INTRAMUSCULAR | Status: DC | PRN

## 2018-05-23 MED ORDER — 0.9 % SODIUM CHLORIDE (POUR BTL) OPTIME
TOPICAL | Status: DC | PRN
  Administered 2018-05-23: 1000 mL

## 2018-05-23 MED ORDER — SUGAMMADEX SODIUM 200 MG/2ML IV SOLN
INTRAVENOUS | Status: AC
  Filled 2018-05-23: qty 2

## 2018-05-23 MED ORDER — MEPERIDINE HCL 50 MG/ML IJ SOLN: 6.2500 mg | INTRAMUSCULAR | Status: DC | PRN

## 2018-05-23 MED ORDER — ONDANSETRON HCL 4 MG/2ML IJ SOLN
INTRAMUSCULAR | Status: DC | PRN
  Administered 2018-05-23: 4 mg via INTRAVENOUS

## 2018-05-23 MED ORDER — PROPOFOL 10 MG/ML IV BOLUS
INTRAVENOUS | Status: DC | PRN
  Administered 2018-05-23: 70 mg via INTRAVENOUS

## 2018-05-23 MED ORDER — LIDOCAINE 2% (20 MG/ML) 5 ML SYRINGE
INTRAMUSCULAR | Status: AC
  Filled 2018-05-23: qty 5

## 2018-05-23 MED ORDER — MIDAZOLAM HCL 5 MG/5ML IJ SOLN
INTRAMUSCULAR | Status: DC | PRN
  Administered 2018-05-23: 2 mg via INTRAVENOUS

## 2018-05-23 MED ORDER — PROPOFOL 10 MG/ML IV BOLUS
INTRAVENOUS | Status: AC
  Filled 2018-05-23: qty 20

## 2018-05-23 MED ORDER — MIDAZOLAM HCL 2 MG/2ML IJ SOLN
INTRAMUSCULAR | Status: AC
  Filled 2018-05-23: qty 2

## 2018-05-23 MED ORDER — ONDANSETRON HCL 4 MG/2ML IJ SOLN
INTRAMUSCULAR | Status: AC
  Filled 2018-05-23: qty 2

## 2018-05-23 MED ORDER — LACTATED RINGERS IV SOLN
INTRAVENOUS | Status: DC
  Administered 2018-05-23 (×2): via INTRAVENOUS

## 2018-05-23 MED ORDER — DEXAMETHASONE SODIUM PHOSPHATE 10 MG/ML IJ SOLN
INTRAMUSCULAR | Status: AC
  Filled 2018-05-23: qty 1

## 2018-05-23 MED ORDER — LIDOCAINE 20MG/ML (2%) 15 ML SYRINGE OPTIME
INTRAMUSCULAR | Status: DC | PRN
  Administered 2018-05-23: 40 mg via INTRAVENOUS

## 2018-05-23 MED ORDER — FENTANYL CITRATE (PF) 100 MCG/2ML IJ SOLN
INTRAMUSCULAR | Status: DC | PRN
  Administered 2018-05-23 (×2): 50 ug via INTRAVENOUS

## 2018-05-23 MED ORDER — SODIUM CHLORIDE 0.9 % IV SOLN
INTRAVENOUS | Status: DC | PRN
  Administered 2018-05-23: 25 ug/min via INTRAVENOUS

## 2018-05-23 MED ORDER — PROMETHAZINE HCL 25 MG/ML IJ SOLN: 6.2500 mg | INTRAMUSCULAR | Status: DC | PRN

## 2018-05-23 SURGICAL SUPPLY — 36 items
ADAPTER VALVE BIOPSY EBUS (MISCELLANEOUS) IMPLANT
ADPTR VALVE BIOPSY EBUS (MISCELLANEOUS)
BRUSH CYTOL CELLEBRITY 1.5X140 (MISCELLANEOUS) ×2 IMPLANT
CANISTER SUCT 3000ML PPV (MISCELLANEOUS) ×2 IMPLANT
CONT SPEC 4OZ CLIKSEAL STRL BL (MISCELLANEOUS) ×4 IMPLANT
COVER BACK TABLE 60X90IN (DRAPES) ×2 IMPLANT
COVER WAND RF STERILE (DRAPES) ×2 IMPLANT
FILTER STRAW FLUID ASPIR (MISCELLANEOUS) IMPLANT
FORCEPS BIOP RJ4 1.8 (CUTTING FORCEPS) IMPLANT
GAUZE SPONGE 4X4 12PLY STRL (GAUZE/BANDAGES/DRESSINGS) ×2 IMPLANT
GLOVE BIOGEL PI IND STRL 6.5 (GLOVE) ×1 IMPLANT
GLOVE BIOGEL PI INDICATOR 6.5 (GLOVE) ×1
GLOVE SURG SS PI 7.5 STRL IVOR (GLOVE) ×2 IMPLANT
GOWN STRL REUS W/ TWL LRG LVL3 (GOWN DISPOSABLE) ×3 IMPLANT
GOWN STRL REUS W/TWL LRG LVL3 (GOWN DISPOSABLE) ×6
KIT CLEAN ENDO COMPLIANCE (KITS) ×4 IMPLANT
KIT TURNOVER KIT B (KITS) ×2 IMPLANT
MARKER SKIN DUAL TIP RULER LAB (MISCELLANEOUS) ×2 IMPLANT
NEEDLE ASPIRATION VIZISHOT 19G (NEEDLE) ×2 IMPLANT
NEEDLE ASPIRATION VIZISHOT 21G (NEEDLE) ×2 IMPLANT
NS IRRIG 1000ML POUR BTL (IV SOLUTION) ×2 IMPLANT
OIL SILICONE PENTAX (PARTS (SERVICE/REPAIRS)) ×2 IMPLANT
PAD ARMBOARD 7.5X6 YLW CONV (MISCELLANEOUS) ×4 IMPLANT
STOPCOCK 4 WAY LG BORE MALE ST (IV SETS) ×2 IMPLANT
SYR 20CC LL (SYRINGE) ×2 IMPLANT
SYR 20ML ECCENTRIC (SYRINGE) ×6 IMPLANT
SYR 3ML LL SCALE MARK (SYRINGE) IMPLANT
SYR 50ML SLIP (SYRINGE) ×4 IMPLANT
SYR 5ML LL (SYRINGE) ×2 IMPLANT
SYRINGE 20CC LL (MISCELLANEOUS) ×2 IMPLANT
TRAP SPECIMEN MUCOUS 40CC (MISCELLANEOUS) IMPLANT
TUBE CONNECTING 20X1/4 (TUBING) ×2 IMPLANT
VALVE BIOPSY  SINGLE USE (MISCELLANEOUS) ×4
VALVE BIOPSY SINGLE USE (MISCELLANEOUS) ×4 IMPLANT
VALVE SUCTION BRONCHIO DISP (MISCELLANEOUS) ×2 IMPLANT
WATER STERILE IRR 1000ML POUR (IV SOLUTION) ×2 IMPLANT

## 2018-05-23 NOTE — Anesthesia Procedure Notes (Signed)
Procedure Name: Intubation Date/Time: 05/23/2018 11:10 AM Performed by: Lowella Dell, CRNA Pre-anesthesia Checklist: Patient identified, Emergency Drugs available, Suction available and Patient being monitored Patient Re-evaluated:Patient Re-evaluated prior to induction Oxygen Delivery Method: Circle System Utilized Preoxygenation: Pre-oxygenation with 100% oxygen Induction Type: IV induction Ventilation: Mask ventilation without difficulty Laryngoscope Size: Mac and 3 Grade View: Grade I Tube type: Oral Tube size: 8.5 mm Number of attempts: 1 Airway Equipment and Method: Stylet Placement Confirmation: ETT inserted through vocal cords under direct vision,  positive ETCO2 and breath sounds checked- equal and bilateral Secured at: 20 cm Tube secured with: Tape Dental Injury: Teeth and Oropharynx as per pre-operative assessment

## 2018-05-23 NOTE — OR Nursing (Signed)
Specimen, BAL from Left Lower Lobe Superior Segment @ 2426 on 05/23/2018 for AFB, Bacteria, Fungi, and Coccidioides PCR sent to Histology via Loreen Freud to be sent to [facility] by order of Dr. Jacinto Reap. Valeta Harms.   Linwood Dibbles, RN

## 2018-05-23 NOTE — Op Note (Signed)
Video Bronchoscopy with Endobronchial Ultrasound Procedure Note  Date of Operation: 05/23/2018  Pre-op Diagnosis: Mediastinal adenopathy, cavitary lung lesion    Post-op Diagnosis: Mediastinal adenopathy, cavitary lung lesion   Surgeon: Garner Nash, DO   Assistants: None   Anesthesia: General endotracheal anesthesia  Operation: Flexible video fiberoptic bronchoscopy with endobronchial ultrasound and biopsies.  Estimated Blood Loss: Minimal, <0AV   Complications: None   Indications and History: Melissa [name] is a 52 y.o. female with history of metastatic breast cancer, history of coccidiomycosis with a left superior segment lower lobe cavitary lung lesion villas mediastinal adenopathy.  The risks, benefits, complications, treatment options and expected outcomes were discussed with the patient.  The possibilities of pneumothorax, pneumonia, reaction to medication, pulmonary aspiration, perforation of a viscus, bleeding, failure to diagnose a condition and creating a complication requiring transfusion or operation were discussed with the patient who freely signed the consent.    Description of Procedure: The patient was examined in the preoperative area and history and data from the preprocedure consultation were reviewed. It was deemed appropriate to proceed.  The patient was taken to [facility] OR 10, identified as ANALUISA TUDOR and the procedure verified as Flexible Video Fiberoptic Bronchoscopy.  A Time Out was held and the above information confirmed. After being taken to the operating room general anesthesia was initiated and the patient  was orally intubated. The video fiberoptic bronchoscope was introduced via the endotracheal tube and a general inspection was performed which showed resection of the left upper lobe and appears to be removal of the lingula based on anatomy with 2 separate bronchial suture lines the right lung anatomy appeared normal.  She also had thick purulent white pus  draining from the superior segment of the left lower lobe which is currently in the anatomic position of the left upper lobe following resection.  The pus was washed from the left lung and sent for cultures.  The standard scope was then withdrawn and the endobronchial ultrasound was used to identify and characterize the peritracheal, hilar and bronchial lymph nodes. Inspection showed inspection of the mediastinum with endobronchial ultrasound revealed an enlarged station 7 lymph node posterior to the left mainstem bronchus. Using real-time ultrasound guidance TB NA biopsies were take from Station 7 nodes and were sent for cytology as well as tissue culture.  We then converted to a 2.0 Olympus bronchoscope and wedging within the superior segment of the left lower lobe we completed to large quantity BALs both with 120 cc of saline and moderate return.  1 to be sent for molecular microbiologic PCR to the [place] [facility] [place].  The second to be sent for cell count, differential, cultures here.  Then using a cytology brush we completed samples within the mid lung zone of the superior segment of the left lower lobe anatomically within the upper lobe zone of the lung under fluoroscopic guidance.  This was done twice and the brush tips were cut and placed in saline for culture.  Following the brushings we completed another 120 cc bronchoalveolar lavage to the superior segments of the left lung with moderate return to ensure adequate sample for all culture testing that is being requested.  The patient tolerated the procedure well without apparent complications. There was no significant blood loss. The bronchoscope was withdrawn. Anesthesia was reversed and the patient was taken to the PACU for recovery.   Samples: 1.  EBNA x8 to station 7, 3 slides, 3 cell block, 2 tissue culture 2.  BAL x3 to the left lower lobe superior segment for tissue culture as well as one being sent to the [facility] [place]  microbiology PCR lab. 3.  Cytology brushings for tissue culture x2 to the left lower lobe superior segment.  Plans:  The patient will be discharged from the PACU to home when recovered from anesthesia. We will review the cytology, pathology and microbiology results with the patient when they become available. Outpatient followup will be with Garner Nash, DO.   Garner Nash, DO [facility] 05/23/2018 12:40 PM  Personal pager: [phone] If unanswered, please page CCM On-call: [phone]

## 2018-05-23 NOTE — Interval H&P Note (Signed)
History and Physical Interval Note:  05/23/2018 11:04 AM  Melissa [name]  has presented today for surgery, with the diagnosis of LUNG MASS  The various methods of treatment have been discussed with the patient and family. After consideration of risks, benefits and other options for treatment, the patient has consented to  Procedure(s): Melissa [name] (N/A) as a surgical intervention .  The patient's history has been reviewed, patient examined, no change in status, stable for surgery.  I have reviewed the patient's chart and labs.  Questions were answered to the patient's satisfaction.    Discussed risks, benefits and alternatives to planned outpatient bronchoscopy. No barriers to proceed. Patient is agreeable. Patient seen and examined in pre-op. Discussed with husband and daughter present.   Garner Nash, DO [facility] 05/23/2018 11:05 AM  Personal pager: [phone] If unanswered, please page CCM On-call: [phone]

## 2018-05-23 NOTE — Anesthesia Postprocedure Evaluation (Signed)
Anesthesia Post Note  Patient: Melissa [name]  Procedure(s) Performed: VIDEO BRONCHOSCOPY WITH ENDOBRONCHIAL ULTRASOUND (N/A Chest)     Patient location during evaluation: PACU Anesthesia Type: General Level of consciousness: awake and alert, oriented and patient cooperative Pain management: pain level controlled Vital Signs Assessment: post-procedure vital signs reviewed and stable Respiratory status: spontaneous breathing, nonlabored ventilation and respiratory function stable Cardiovascular status: blood pressure returned to baseline and stable Postop Assessment: no apparent nausea or vomiting Anesthetic complications: no    Last Vitals:  Vitals:   05/23/18 1452 05/23/18 1457  BP: 93/75 99/66  Pulse: 69 76  Resp: (!) 21 (!) 21  Temp:    SpO2: 96% 95%    Last Pain:  Vitals:   05/23/18 1435  TempSrc:   PainSc: 0-No pain                 Keyonni Percival,E. Tahani Potier

## 2018-05-23 NOTE — Anesthesia Preprocedure Evaluation (Addendum)
Anesthesia Evaluation  Patient identified by MRN, date of birth, ID band Patient awake    Reviewed: Allergy & Precautions, NPO status , Patient's Chart, lab work & pertinent test results  History of Anesthesia Complications Negative for: history of anesthetic complications  Airway Mallampati: II  TM Distance: >3 FB Neck ROM: Full    Dental  (+) Edentulous Upper, Poor Dentition, Missing, Chipped, Dental Advisory Given   Pulmonary COPD,  COPD inhaler, Current Smoker,  S/p lobectomy for coccydiomycosis    + wheezing      Cardiovascular (-) angina Rhythm:Regular Rate:Normal  3/19 ECHO: EF 60-65%, valves OK   Neuro/Psych  Headaches,    GI/Hepatic Neg liver ROS, GERD  Controlled,  Endo/Other  negative endocrine ROS  Renal/GU negative Renal ROS     Musculoskeletal   Abdominal   Peds  Hematology negative hematology ROS (+)   Anesthesia Other Findings Breast cancer: chemo, XRT  Reproductive/Obstetrics                             Anesthesia Physical Anesthesia Plan  ASA: III  Anesthesia Plan: General   Post-op Pain Management:    Induction: Intravenous  PONV Risk Score and Plan: 1 and Ondansetron and Dexamethasone  Airway Management Planned: Oral ETT  Additional Equipment:   Intra-op Plan:   Post-operative Plan: Extubation in OR  Informed Consent: I have reviewed the patients History and Physical, chart, labs and discussed the procedure including the risks, benefits and alternatives for the proposed anesthesia with the patient or authorized representative who has indicated his/her understanding and acceptance.   Dental advisory given  Plan Discussed with: CRNA and Surgeon  Anesthesia Plan Comments: (Plan routine monitors, GETA)       Anesthesia Quick Evaluation

## 2018-05-23 NOTE — Discharge Instructions (Signed)
Flexible Bronchoscopy, Care After This sheet gives you information about how to care for yourself after your procedure. Your health care provider may also give you more specific instructions. If you have problems or questions, contact your health care provider. What can I expect after the procedure? After the procedure, it is common to have the following symptoms for 24-48 hours:  A cough that is worse than it was before the procedure.  A low-grade fever.  A sore throat or hoarse voice.  Small streaks of blood in the mucus from your lungs (sputum), if tissue samples were removed (biopsy).  Follow these instructions at home: Eating and drinking  The day after the procedure, return to your normal diet. Driving  Do not drive for 24 hours if you were given a medicine to help you relax (sedative).  Do not drive or use heavy machinery while taking prescription pain medicine. General instructions  Take over-the-counter and prescription medicines only as told by your health care provider.  Return to your normal activities as told by your health care provider. Ask your health care provider what activities are safe for you.  Do not use any products that contain nicotine or tobacco, such as cigarettes and e-cigarettes. If you need help quitting, ask your health care provider.  Keep all follow-up visits as told by your health care provider. This is important, especially if you had a biopsy taken. Get help right away if:  You have shortness of breath that gets worse.  You become light-headed or feel like you might faint.  You have chest pain.  You cough up more than a small amount of blood.  The amount of blood you cough up increases. Summary  Common symptoms in the 24-48 hours following a flexible bronchoscopy include cough, low-grade fever, sore throat or hoarse voice, and blood-streaked mucus from the lungs (if you had a biopsy).  Do not eat or drink anything (including water) for  2 hours after your procedure, or until your local anesthetic has worn off. You can return to your normal diet the day after the procedure.  Get help right away if you develop worsening shortness of breath, have chest pain, become light-headed, or cough up more than a small amount of blood.  This information is not intended to replace advice given to you by your health care provider. Make sure you discuss any questions you have with your health care provider. Document Released: 01/14/2005 Document Revised: 07/15/2016 Document Reviewed: 07/15/2016 Elsevier Interactive Patient Education  2017 [employer].

## 2018-05-23 NOTE — Transfer of Care (Signed)
Immediate Anesthesia Transfer of Care Note  Patient: Melissa [name]  Procedure(s) Performed: VIDEO BRONCHOSCOPY WITH ENDOBRONCHIAL ULTRASOUND (N/A Chest)  Patient Location: PACU  Anesthesia Type:General  Level of Consciousness: awake, alert  and patient cooperative  Airway & Oxygen Therapy: Patient Spontanous Breathing and Patient connected to nasal cannula oxygen  Post-op Assessment: Report given to RN, Post -op Vital signs reviewed and stable and Patient moving all extremities  Post vital signs: Reviewed and stable  Last Vitals:  Vitals Value Taken Time  BP 95/47 05/23/2018  1:04 PM  Temp    Pulse 72 05/23/2018  1:10 PM  Resp 26 05/23/2018  1:10 PM  SpO2 100 % 05/23/2018  1:10 PM  Vitals shown include unvalidated device data.  Last Pain:  Vitals:   05/23/18 0931  TempSrc:   PainSc: 4       Patients Stated Pain Goal: 2 (01/19/18 7588)  Complications: No apparent anesthesia complications

## 2018-05-24 ENCOUNTER — Encounter (HOSPITAL_COMMUNITY): Payer: Self-pay | Admitting: Pulmonary Disease

## 2018-05-24 LAB — ACID FAST SMEAR (AFB, MYCOBACTERIA)
Acid Fast Smear: NEGATIVE
Acid Fast Smear: NEGATIVE

## 2018-05-24 LAB — ACID FAST SMEAR (AFB)
ACID FAST SMEAR - AFSCU2: NEGATIVE
ACID FAST SMEAR - AFSCU2: NEGATIVE

## 2018-05-24 NOTE — Progress Notes (Signed)
Brain and Spine Tumor Board Documentation  Melissa [name] was presented by Cecil Cobbs, MD at Brain and Spine Tumor Board on 05/24/2018, which included representatives from neuro oncology, radiation oncology, surgical oncology.  Melissa [name] was presented as a current patient with history of the following treatments:  .  Additionally, we reviewed previous medical and familial history, history of present illness, and recent lab results along with all available histopathologic and imaging studies. The tumor board considered available treatment options and made the following recommendations:  Active surveillance    Tumor board is a meeting of clinicians from various specialty areas who evaluate and discuss patients for whom a multidisciplinary approach is being considered. Final determinations in the plan of care are those of the provider(s). The responsibility for follow up of recommendations given during tumor board is that of the provider.   Today's extended care, comprehensive team conference, Melissa [name] was not present for the discussion and was not examined.

## 2018-05-25 ENCOUNTER — Telehealth: Payer: Self-pay | Admitting: Pulmonary Disease

## 2018-05-25 LAB — CULTURE, RESPIRATORY W GRAM STAIN
Culture: NO GROWTH
Gram Stain: NONE SEEN

## 2018-05-25 LAB — CULTURE, RESPIRATORY: CULTURE: NO GROWTH

## 2018-05-25 LAB — PATHOLOGIST SMEAR REVIEW

## 2018-05-25 MED ORDER — CHLORPHENIRAMINE-CODEINE 2-10 MG/5ML PO LIQD: 5.0000 mL | Freq: Four times a day (QID) | ORAL | 0 refills | Status: DC | PRN

## 2018-05-25 NOTE — Telephone Encounter (Signed)
Spoke with pt. She is requesting a copy of her bronch results. Advised her that these results aren't in Epic yet. While speaking to her she asked for a prescription for cough syrup to help with sleep at night time. Advised her that it would be Monday before we could get this to her. She was fine with waiting. Will route message to Dr. Valeta Harms to advise on prescription.

## 2018-05-25 NOTE — Telephone Encounter (Signed)
PCCM:  Orders placed.  Meds ordered this encounter  Medications  . Chlorpheniramine-Codeine 2-10 MG/5ML LIQD    Sig: Take 5 mLs by mouth every 6 (six) hours as needed.    Dispense:  200 mL    Refill:  0   Sent to [place], [address] [address] Phone: [phone] Fax: [phone]  Patients needs OP Report for [facility] insurance.   Can print OP Note from office out of Epic for patient to have.   CC: Lynnae January, DO [facility] 05/25/2018 3:14 PM

## 2018-05-28 ENCOUNTER — Inpatient Hospital Stay (HOSPITAL_BASED_OUTPATIENT_CLINIC_OR_DEPARTMENT_OTHER): Payer: 59 | Admitting: Hematology and Oncology

## 2018-05-28 ENCOUNTER — Inpatient Hospital Stay: Payer: 59 | Attending: Hematology and Oncology

## 2018-05-28 ENCOUNTER — Telehealth: Payer: Self-pay | Admitting: Hematology and Oncology

## 2018-05-28 DIAGNOSIS — C773 Secondary and unspecified malignant neoplasm of axilla and upper limb lymph nodes: Secondary | ICD-10-CM | POA: Insufficient documentation

## 2018-05-28 DIAGNOSIS — R911 Solitary pulmonary nodule: Secondary | ICD-10-CM | POA: Insufficient documentation

## 2018-05-28 DIAGNOSIS — C50212 Malignant neoplasm of upper-inner quadrant of left female breast: Secondary | ICD-10-CM | POA: Diagnosis present

## 2018-05-28 DIAGNOSIS — C7931 Secondary malignant neoplasm of brain: Secondary | ICD-10-CM

## 2018-05-28 DIAGNOSIS — Z170 Estrogen receptor positive status [ER+]: Secondary | ICD-10-CM

## 2018-05-28 DIAGNOSIS — Z79899 Other long term (current) drug therapy: Secondary | ICD-10-CM | POA: Diagnosis not present

## 2018-05-28 LAB — CMP (CANCER CENTER ONLY)
ALK PHOS: 145 U/L — AB (ref 38–126)
ALT: 40 U/L (ref 0–44)
AST: 30 U/L (ref 15–41)
Albumin: 3.4 g/dL — ABNORMAL LOW (ref 3.5–5.0)
Anion gap: 9 (ref 5–15)
BUN: 8 mg/dL (ref 6–20)
CALCIUM: 10.1 mg/dL (ref 8.9–10.3)
CO2: 27 mmol/L (ref 22–32)
CREATININE: 0.84 mg/dL (ref 0.44–1.00)
Chloride: 105 mmol/L (ref 98–111)
Glucose, Bld: 95 mg/dL (ref 70–99)
Potassium: 3.8 mmol/L (ref 3.5–5.1)
Sodium: 141 mmol/L (ref 135–145)
Total Protein: 8.1 g/dL (ref 6.5–8.1)

## 2018-05-28 LAB — AEROBIC/ANAEROBIC CULTURE W GRAM STAIN (SURGICAL/DEEP WOUND)

## 2018-05-28 LAB — CBC WITH DIFFERENTIAL (CANCER CENTER ONLY)
ABS IMMATURE GRANULOCYTES: 0.03 10*3/uL (ref 0.00–0.07)
BASOS PCT: 1 %
Basophils Absolute: 0.1 10*3/uL (ref 0.0–0.1)
Eosinophils Absolute: 1 10*3/uL — ABNORMAL HIGH (ref 0.0–0.5)
Eosinophils Relative: 9 %
HCT: 34.5 % — ABNORMAL LOW (ref 36.0–46.0)
Hemoglobin: 11.1 g/dL — ABNORMAL LOW (ref 12.0–15.0)
Immature Granulocytes: 0 %
Lymphocytes Relative: 24 %
Lymphs Abs: 2.9 10*3/uL (ref 0.7–4.0)
MCH: 28.6 pg (ref 26.0–34.0)
MCHC: 32.2 g/dL (ref 30.0–36.0)
MCV: 88.9 fL (ref 80.0–100.0)
Monocytes Absolute: 0.6 10*3/uL (ref 0.1–1.0)
Monocytes Relative: 5 %
NEUTROS ABS: 7.4 10*3/uL (ref 1.7–7.7)
NEUTROS PCT: 61 %
NRBC: 0 % (ref 0.0–0.2)
PLATELETS: 399 10*3/uL (ref 150–400)
RBC: 3.88 MIL/uL (ref 3.87–5.11)
RDW: 16.5 % — ABNORMAL HIGH (ref 11.5–15.5)
WBC: 12 10*3/uL — AB (ref 4.0–10.5)

## 2018-05-28 LAB — AEROBIC/ANAEROBIC CULTURE (SURGICAL/DEEP WOUND): CULTURE: NO GROWTH

## 2018-05-28 NOTE — Telephone Encounter (Signed)
Called and spoke to patient. Informed patient I could print her a copy of the Bronch notes. Patient also voiced that she went to pick up the prescription for her cough medicine and the pharmacy was out of it. Patient would like to have a printed prescription to take to pharmacy.   Chlorpheniramine-Codeine 2-10 MG/5ML  Take 43ml Q6H prn #224ml  SG please advise if willing to sign script as BI is not here.

## 2018-05-28 NOTE — Telephone Encounter (Signed)
The original RX was called into the pharmacy by Dr. Valeta Harms. [facility] verified that Dr. Valeta Harms himself called in the RX. Patient showed up in the lobby, no one from this office had called to tell her to come in. Since our computers are not printing RXs, Dr. Lenna Gilford was nice enough to write the RX for the Tussionex on a RX pad. I made a copy of the RX and will have it scanned into her chart.   Patient was given the original RX. Nothing further needed at time of call.

## 2018-05-28 NOTE — Telephone Encounter (Signed)
What happened to the original prescription? If she never picked up the medication she needs to take the original prescription  she had to the pharmacy. This is a controlled substance. Where is the original prescription? We don'y usually write two scripts for same dose of medication.

## 2018-05-28 NOTE — Telephone Encounter (Signed)
Gave avs and calendar ° °

## 2018-05-28 NOTE — Assessment & Plan Note (Signed)
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 Anti Her 2 therapy with Lapatinibstarted 09/17/2017 3.I discontinuedtamoxifen and started her on letrozole 2.5 mg daily.  Lapatinib toxicities:Currently on 1000 mglapatinibwith Letrozole 2.5 mg Occasional diarrhea. She is working and is very happy about it  Stereotactic radiosurgery 12/19/2017 to the new right parietal lobe metastases.  CT CAP: 03/23/18: LUL cavitary arch destortion with ground glass attenuation subpleural LUL (Radiation pneumonitis vs Inf); Inc in size of left mediastinal LN (non specific), LUL nodule 4 mm MRI brain 05/18/2018: Right frontal resection cavity 4 mm Return to clinic in4weekswith scan and follow-up

## 2018-05-28 NOTE — Progress Notes (Signed)
Patient Care Team: Patient, [name] as PCP - General (General Practice) [name], MD as Referring Physician (Emergency Medicine)  DIAGNOSIS:  Encounter Diagnosis  Name Primary?  . Malignant neoplasm of upper-inner quadrant of left breast in female, estrogen receptor positive ([place])     SUMMARY OF ONCOLOGIC HISTORY:   Breast cancer of upper-inner quadrant of left female breast ([place])   06/08/2012 Initial Diagnosis    invasive ductal carcinoma that was ER positive PR positive HER-2/neu positive measuring 3.1 cm by MRI criteria. Ki-67 was 70% HER-2 was amplified with a ratio 2.91    07/12/2012 - 07/17/2013 Neo-Adjuvant Chemotherapy    TCH 6 followed by Herceptin maintenance    12/11/2012 Surgery    Left breast lumpectomy: 1.8 cm tumor 1 positive sentinel node, axillary lymph node dissection 02/08/2013 showed 0/13 lymph nodes    03/25/2013 - 05/06/2013 Radiation Therapy    Adjuvant radiation therapy    06/05/2013 - 07/20/2017 Anti-estrogen oral therapy    Tamoxifen 20 mg daily    07/27/2017 Relapse/Recurrence    MRI Brain: 3.4 x 2.9 x 2.9 cm RIGHT frontal lobe mass with imaging characteristics of solitary metastasis. Extensive vasogenic edema resulting in 9 mm RIGHT to LEFT midline shift. Equivocal very early LEFT ventricle entrapment.     08/04/2017 Surgery    Rt frontal brain resection: Poorly differentiated tumor IHC suggests breast primary ER and PR Positive    08/25/2017 - 09/04/2017 Radiation Therapy    Stereotactic radiation    09/18/2017 -  Anti-estrogen oral therapy    Lapatinib with letrozole    12/18/2017 - 12/19/2017 Radiation Therapy    New right parietal lobe metastases status post SRS     CHIEF COMPLIANT: Follow-up on lapatinib with letrozole  INTERVAL HISTORY: Melissa [name] is a 52 year old with above-mentioned history of metastatic breast cancer with brain metastasis status post resection who is currently in the back and with letrozole.  She is tolerating  the treatment extremely well.  She has been having cough with productive sputum and had seen pulmonary.  She underwent bronchoscopy and biopsies.  Infectious work-up is in progress.  So far the results have been negative.  Cytology did not reveal any malignancy on the mediastinal lymph node.  Denies any diarrhea or nausea vomiting.  She is working full-time and is doing quite well.  REVIEW OF SYSTEMS:   Constitutional: Denies fevers, chills or abnormal weight loss Eyes: Denies blurriness of vision Ears, nose, mouth, throat, and face: Denies mucositis or sore throat Respiratory: Denies cough, dyspnea or wheezes Cardiovascular: Denies palpitation, chest discomfort Gastrointestinal:  Denies nausea, heartburn or change in bowel habits Skin: Denies abnormal skin rashes Lymphatics: Denies new lymphadenopathy or easy bruising Neurological:Denies numbness, tingling or new weaknesses Behavioral/Psych: Mood is stable, no new changes  Extremities: No lower extremity edema   All other systems were reviewed with the patient and are negative.  I have reviewed the past medical history, past surgical history, social history and family history with the patient and they are unchanged from previous note.  ALLERGIES:  is allergic to aspirin; protonix [pantoprazole]; and iodinated diagnostic agents.  MEDICATIONS:  Current Outpatient Medications  Medication Sig Dispense Refill  . acetaminophen-codeine (TYLENOL #3) 300-30 MG tablet Take 1 tablet by mouth every 4 (four) hours as needed (cough). 30 tablet 0  . albuterol (VENTOLIN HFA) 108 (90 BASE) MCG/ACT inhaler Inhale 2 puffs into the lungs every 6 (six) hours as needed. 1 Inhaler 3  . budesonide-formoterol (SYMBICORT)  80-4.5 MCG/ACT inhaler Inhale 2 puffs into the lungs 2 (two) times daily. 1 Inhaler 5  . Chlorpheniramine-Codeine 2-10 MG/5ML LIQD Take 5 mLs by mouth every 6 (six) hours as needed. 200 mL 0  . fluconazole (DIFLUCAN) 200 MG tablet Take 2 tablets  (400 mg total) by mouth daily. 60 tablet 1  . letrozole (FEMARA) 2.5 MG tablet Take 1 tablet (2.5 mg total) by mouth daily. 90 tablet 3  . levalbuterol (XOPENEX) 0.63 MG/3ML nebulizer solution Take 3 mLs (0.63 mg total) by nebulization every 4 (four) hours as needed for wheezing or shortness of breath. 540 mL 6  . naproxen sodium (ALEVE) 220 MG tablet Take 440 mg by mouth 2 (two) times daily as needed (headache).     . TYKERB 250 MG tablet TAKE 4 TABLETS (1,000 MG TOTAL) BY MOUTH DAILY. TAKE ON AN EMPTY STOMACH, 1 HOUR BEFORE OR 2 HOURS AFTER MEALS. 150 tablet 3   No current facility-administered medications for this visit.     PHYSICAL EXAMINATION: ECOG PERFORMANCE STATUS: 1 - Symptomatic but completely ambulatory  There were no vitals filed for this visit. There were no vitals filed for this visit.  GENERAL:alert, no distress and comfortable SKIN: skin color, texture, turgor are normal, no rashes or significant lesions EYES: normal, Conjunctiva are pink and non-injected, sclera clear OROPHARYNX:no exudate, no erythema and lips, buccal mucosa, and tongue normal  NECK: supple, thyroid normal size, non-tender, without nodularity LYMPH:  no palpable lymphadenopathy in the cervical, axillary or inguinal LUNGS: clear to auscultation and percussion with normal breathing effort HEART: regular rate & rhythm and no murmurs and no lower extremity edema ABDOMEN:abdomen soft, non-tender and normal bowel sounds MUSCULOSKELETAL:no cyanosis of digits and no clubbing  NEURO: alert & oriented x 3 with fluent speech, no focal motor/sensory deficits EXTREMITIES: No lower extremity edema   LABORATORY DATA:  I have reviewed the data as listed CMP Latest Ref Rng & Units 05/22/2018 05/06/2018 03/26/2018  Glucose 70 - 99 mg/dL 89 75 90  BUN 6 - 20 mg/dL '6 9 8  '$ Creatinine 0.44 - 1.00 mg/dL 0.79 0.85 0.88  Sodium 135 - 145 mmol/L 138 134(L) 141  Potassium 3.5 - 5.1 mmol/L 4.2 4.3 4.3  Chloride 98 - 111  mmol/L 103 101 105  CO2 22 - 32 mmol/L '27 26 27  '$ Calcium 8.9 - 10.3 mg/dL 9.5 9.5 9.9  Total Protein 6.5 - 8.1 g/dL 7.5 - 7.6  Total Bilirubin 0.3 - 1.2 mg/dL 0.4 - <0.2(L)  Alkaline Phos 38 - 126 U/L 124 - 86  AST 15 - 41 U/L 25 - 12(L)  ALT 0 - 44 U/L 25 - 8    Lab Results  Component Value Date   WBC 12.0 (H) 05/28/2018   HGB 11.1 (L) 05/28/2018   HCT 34.5 (L) 05/28/2018   MCV 88.9 05/28/2018   PLT 399 05/28/2018   NEUTROABS 7.4 05/28/2018    ASSESSMENT & PLAN:  Breast cancer of upper-inner quadrant of left female breast ([facility]) 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 Anti Her 2 therapy with Lapatinibstarted 09/17/2017 3.I discontinuedtamoxifen and started her on letrozole 2.5 mg daily.  Lapatinib toxicities:Currently on 1000 mglapatinibwith Letrozole 2.5 mg Occasional diarrhea. She is working and is very happy about it  Stereotactic radiosurgery 12/19/2017 to the new right parietal lobe metastases.  CT CAP: 03/23/18: LUL cavitary arch destortion with ground glass attenuation subpleural LUL (Radiation pneumonitis vs Inf); Inc in size of left mediastinal LN (non specific), LUL nodule 4 mm MRI brain 05/18/2018: Right frontal resection cavity 4 mm Return to clinic in4weekswith scan and follow-up  No orders of the defined types were placed in this encounter.  The patient has a good understanding of the overall plan. she agrees with it. she will call with any problems that may develop before the next visit here.   Harriette Ohara,  MD 05/28/18

## 2018-05-28 NOTE — Telephone Encounter (Signed)
Pt is calling back [phone]

## 2018-06-05 ENCOUNTER — Ambulatory Visit: Payer: 59 | Admitting: Pulmonary Disease

## 2018-06-05 NOTE — Progress Notes (Deleted)
Synopsis: Referred in November 2019 for bronchoscopy evaluation by Dr. Melvyn Novas.  Subjective:   PATIENT ID: Melissa [name] GENDER: female DOB: 1966-03-01, MRN: [id number]  No chief complaint on file.   This is a 52 year old female with a history of coccidiomycosis, [place] fever in [place] status post lobectomy, left upper lobe in 1997 years of antifungal therapy on fluconazole.  She has subsequently over the years developed bronchiectasis.  Unfortunately she is still an active smoker.  In November 2013 she had breast cancer status post lung neck lumpectomy with a positive node and finished with XRT and chemo in 2015.  In May 2019 had MRI concerning for right parietal brain metastasis.  The patient ultimately underwent resection of the frontal lobe metastasis.  The patient's current symptoms include cough and daily sputum production.  She is using her flutter valve.  Unfortunately she still is smoking.  She states that she is trying to quit but she has had much difficulty.  Patient denies fevers, chills, night sweats.  But she does have chronic sputum production that is yellow-green in color.  She was [facility] which is recommended bronchoscopy for sampling and cultures.   Past Medical History:  Diagnosis Date  . Anemia   . Arthritis    knees and hips  . Asthma   . Breast cancer ([place])   . Bronchiectasis ([place])   . Bronchiolitis   . Cancer [facility])    breast cancer 2014  . Complication of anesthesia    bp dropped + desat   . COPD (chronic obstructive pulmonary disease) ([place])   . Dyspnea    DOE  . GERD (gastroesophageal reflux disease)   . H/O coccidioidomycosis    was reason for lung lobectomy  . Headache(784.0)    due to eye strain or not eating  . History of anemia    no current problem  . History of asthma    as a child  . History of breast cancer 2014   left  . History of chemotherapy    finished 07/17/2013  . History of hiatal hernia    AGE  76  . Hx of radiation therapy 03/25/13-05/06/13   left breast 5000 cGy/25 sessions, left breast boost 1000 cGy/5 sessions  . Pneumonia    LAST FLARE UP 01/2018  . Runny nose 07/30/2013   clear drainage  . Wears dentures    upper     Family History  Problem Relation Age of Onset  . Emphysema Mother        was a smoker  . Heart disease Mother   . Melanoma Mother        dx in her 77s  . Breast cancer Mother 46  . Asthma Brother   . Breast cancer Cousin        mother's maternal cousin; dx in her 66s     Past Surgical History:  Procedure Laterality Date  . APPLICATION OF CRANIAL NAVIGATION N/A 08/04/2017   Procedure: APPLICATION OF CRANIAL NAVIGATION;  Surgeon: [name], [name], MD;  Location: [place];  Service: Neurosurgery;  Laterality: N/A;  . AXILLARY LYMPH NODE DISSECTION Left 02/08/2013   Procedure: LEFT AXILLARY DISSECTION;  Surgeon: Edward Jolly, MD;  Location: [place];  Service: General;  Laterality: Left;  . BREAST CYST EXCISION Right 2006  . BREAST LUMPECTOMY Left 2014  . BREAST LUMPECTOMY WITH NEEDLE LOCALIZATION AND AXILLARY SENTINEL LYMPH NODE BX Left 12/31/2012   Procedure:  NEEDLE LOCALIZATION LEFT BREAST LUMPECTOMY AND LEFT AXILLARY SENTENIAL LYMPH NODE BX;  Surgeon: Edward Jolly, MD;  Location: [place];  Service: General;  Laterality: Left;  . CESAREAN SECTION  1995/1996  . CRANIOTOMY Right 08/04/2017   Procedure: Right Frontal craniotomy for resection of tumor with stereotactic navigation;  Surgeon: [name], [name], MD;  Location: [place];  Service: Neurosurgery;  Laterality: Right;  Right Frontal craniotomy for resection of tumor with stereotactic navigation  . LUNG LOBECTOMY Left 05/1996   upper lobe - due to [facility] Fever  . PORT-A-CATH REMOVAL Right 08/02/2013   Procedure: REMOVAL PORT-A-CATH;  Surgeon: Edward Jolly, MD;  Location: [place];  Service: General;  Laterality: Right;  .  PORTACATH PLACEMENT  07/02/2012   Procedure: INSERTION PORT-A-CATH;  Surgeon: Edward Jolly, MD;  Location: [place];  Service: General;  Laterality: N/A;  right  . VIDEO BRONCHOSCOPY WITH ENDOBRONCHIAL ULTRASOUND N/A 05/23/2018   Procedure: VIDEO BRONCHOSCOPY WITH ENDOBRONCHIAL ULTRASOUND;  Surgeon: Garner Nash, DO;  [facility];  Service: Thoracic;  Laterality: N/A;    Social History   Socioeconomic History  . Marital status: Married    Spouse name: Not on file  . Number of children: 2  . Years of education: Not on file  . Highest education level: Not on file  Occupational History  . Occupation: Secondary school teacher  Social Needs  . Financial resource strain: Not on file  . Food insecurity:    Worry: Not on file    Inability: Not on file  . Transportation needs:    Medical: Not on file    Non-medical: Not on file  Tobacco Use  . Smoking status: Current Every Day Smoker    Packs/day: 0.75    Years: 24.00    Pack years: 18.00    Types: Cigarettes  . Smokeless tobacco: Never Used  Substance and Sexual Activity  . Alcohol use: No  . Drug use: No  . Sexual activity: Yes    Comment: trying to quit  Lifestyle  . Physical activity:    Days per week: Not on file    Minutes per session: Not on file  . Stress: Not on file  Relationships  . Social connections:    Talks on phone: Not on file    Gets together: Not on file    Attends religious service: Not on file    Active member of club or organization: Not on file    Attends meetings of clubs or organizations: Not on file    Relationship status: Not on file  . Intimate partner violence:    Fear of current or ex partner: No    Emotionally abused: No    Physically abused: No    Forced sexual activity: No  Other Topics Concern  . Not on file  Social History Narrative  . Not on file     Allergies  Allergen Reactions  . Aspirin Anaphylaxis and Shortness Of Breath    THROAT CLOSES  .  Protonix [Pantoprazole] Nausea Only and Other (See Comments)    Also caused a "film in the mouth" and caused chest pressure  . Iodinated Diagnostic Agents Rash     Outpatient Medications Prior to Visit  Medication Sig Dispense Refill  . acetaminophen-codeine (TYLENOL #3) 300-30 MG tablet Take 1 tablet by mouth every 4 (four) hours as needed (cough). 30 tablet 0  . albuterol (VENTOLIN HFA) 108 (90 BASE) MCG/ACT inhaler Inhale  2 puffs into the lungs every 6 (six) hours as needed. 1 Inhaler 3  . budesonide-formoterol (SYMBICORT) 80-4.5 MCG/ACT inhaler Inhale 2 puffs into the lungs 2 (two) times daily. 1 Inhaler 5  . Chlorpheniramine-Codeine 2-10 MG/5ML LIQD Take 5 mLs by mouth every 6 (six) hours as needed. 200 mL 0  . fluconazole (DIFLUCAN) 200 MG tablet Take 2 tablets (400 mg total) by mouth daily. 60 tablet 1  . letrozole (FEMARA) 2.5 MG tablet Take 1 tablet (2.5 mg total) by mouth daily. 90 tablet 3  . levalbuterol (XOPENEX) 0.63 MG/3ML nebulizer solution Take 3 mLs (0.63 mg total) by nebulization every 4 (four) hours as needed for wheezing or shortness of breath. 540 mL 6  . naproxen sodium (ALEVE) 220 MG tablet Take 440 mg by mouth 2 (two) times daily as needed (headache).     . TYKERB 250 MG tablet TAKE 4 TABLETS (1,000 MG TOTAL) BY MOUTH DAILY. TAKE ON AN EMPTY STOMACH, 1 HOUR BEFORE OR 2 HOURS AFTER MEALS. 150 tablet 3   No facility-administered medications prior to visit.     Review of Systems  Constitutional: Negative for chills, fever, malaise/fatigue and weight loss.  HENT: Negative for hearing loss, sore throat and tinnitus.   Eyes: Negative for blurred vision and double vision.  Respiratory: Positive for cough, sputum production, shortness of breath and wheezing. Negative for hemoptysis and stridor.   Cardiovascular: Negative for chest pain, palpitations, orthopnea, leg swelling and PND.  Gastrointestinal: Negative for abdominal pain, constipation, diarrhea, heartburn, nausea  and vomiting.  Genitourinary: Negative for dysuria, hematuria and urgency.  Musculoskeletal: Negative for joint pain and myalgias.  Skin: Negative for itching and rash.  Neurological: Negative for dizziness, tingling, weakness and headaches.  Endo/Heme/Allergies: Negative for environmental allergies. Does not bruise/bleed easily.  Psychiatric/Behavioral: Negative for depression. The patient is not nervous/anxious and does not have insomnia.   All other systems reviewed and are negative.    Objective:  Physical Exam  Constitutional: She is oriented to person, place, and time. She appears well-developed and well-nourished. No distress.  HENT:  Head: Normocephalic and atraumatic.  Mouth/Throat: Oropharynx is clear and moist.  Eyes: Pupils are equal, round, and reactive to light. Conjunctivae are normal. No scleral icterus.  Neck: Neck supple. No JVD present. No tracheal deviation present.  Cardiovascular: Normal rate, regular rhythm, normal heart sounds and intact distal pulses.  No murmur heard. Pulmonary/Chest: Effort normal. No accessory muscle usage or stridor. No tachypnea. No respiratory distress. She has wheezes. She has no rhonchi. She has rales.  Tubular breath sounds predominantly within the left chest  Abdominal: Soft. Bowel sounds are normal. She exhibits no distension. There is no tenderness.  Musculoskeletal: She exhibits no edema or tenderness.  Lymphadenopathy:    She has no cervical adenopathy.  Neurological: She is alert and oriented to person, place, and time.  Skin: Skin is warm and dry. Capillary refill takes less than 2 seconds. No rash noted.  Psychiatric: She has a normal mood and affect. Her behavior is normal.  Vitals reviewed.    There were no vitals filed for this visit.   on RA. BMI Readings from Last 3 Encounters:  05/28/18 20.16 kg/m  05/23/18 20.12 kg/m  05/22/18 20.38 kg/m   Wt Readings from Last 3 Encounters:  05/28/18 110 lb 3.2 oz (50 kg)    05/23/18 110 lb (49.9 kg)  05/22/18 111 lb 6.4 oz (50.5 kg)     CBC    Component Value Date/Time  WBC 12.0 (H) 05/28/2018 1144   WBC 9.9 05/22/2018 0915   RBC 3.88 05/28/2018 1144   HGB 11.1 (L) 05/28/2018 1144   HGB 13.1 08/12/2014 0955   HCT 34.5 (L) 05/28/2018 1144   HCT 40.4 08/12/2014 0955   PLT 399 05/28/2018 1144   PLT 228 08/12/2014 0955   MCV 88.9 05/28/2018 1144   MCV 98.2 08/12/2014 0955   MCH 28.6 05/28/2018 1144   MCHC 32.2 05/28/2018 1144   RDW 16.5 (H) 05/28/2018 1144   RDW 13.0 08/12/2014 0955   LYMPHSABS 2.9 05/28/2018 1144   LYMPHSABS 2.0 08/12/2014 0955   MONOABS 0.6 05/28/2018 1144   MONOABS 0.5 08/12/2014 0955   EOSABS 1.0 (H) 05/28/2018 1144   EOSABS 0.2 08/12/2014 0955   BASOSABS 0.1 05/28/2018 1144   BASOSABS 0.0 08/12/2014 0955    Chest Imaging: 04/26/2018: CT imaging Left upper lobe cavity, cystic bronchiectasis thick secretions. Subcarinal adenopathy The patient's images have been independently reviewed by me.    Pulmonary Functions Testing Results: PFT Results Latest Ref Rng & Units 08/09/2016  FVC-Pre L 2.79  FVC-Predicted Pre % 83  FVC-Post L 2.92  FVC-Predicted Post % 87  Pre FEV1/FVC % % 69  Post FEV1/FCV % % 70  FEV1-Pre L 1.94  FEV1-Predicted Pre % 73  FEV1-Post L 2.05  DLCO UNC% % 46  DLCO COR %Predicted % 55  TLC L 4.75  TLC % Predicted % 100  RV % Predicted % 124    FeNO: None   Pathology: None   Echocardiogram:  09/18/2017: EF 60-65%, G1d  Heart Catheterization: None    Assessment & Plan:   No diagnosis found.  Discussion:  We will plan for EBUS bronchoscopy with biopsy of the subcarinal lymphadenopathy due to the patient's history of metastatic breast cancer.  In addition we will send the tissue for culture.  We will also complete washings of the left upper lobe as well as fluoroscopic guided micro brushes as well as bronchioloalveolar lavage for culture.  Additionally we can send samples to the  [facility] microbiology lab.  We discussed the risk, versus benefits versus alternatives to bronchoscopy today in clinic.  Patient has agreed and elected to proceed with outpatient bronchoscopy.  We will plan for outpatient bronchoscopy sometime next week.  Staff to follow-up with scheduling.  Return to clinic following bronchoscopy  Greater than 50% of this patient's 60-minute office visit was spent face-to-face discussing the above diagnostic treatment plan and risk/benefits/alternatives to bronchoscopy.   Current Outpatient Medications:  .  acetaminophen-codeine (TYLENOL #3) 300-30 MG tablet, Take 1 tablet by mouth every 4 (four) hours as needed (cough)., Disp: 30 tablet, Rfl: 0 .  albuterol (VENTOLIN HFA) 108 (90 BASE) MCG/ACT inhaler, Inhale 2 puffs into the lungs every 6 (six) hours as needed., Disp: 1 Inhaler, Rfl: 3 .  budesonide-formoterol (SYMBICORT) 80-4.5 MCG/ACT inhaler, Inhale 2 puffs into the lungs 2 (two) times daily., Disp: 1 Inhaler, Rfl: 5 .  Chlorpheniramine-Codeine 2-10 MG/5ML LIQD, Take 5 mLs by mouth every 6 (six) hours as needed., Disp: 200 mL, Rfl: 0 .  fluconazole (DIFLUCAN) 200 MG tablet, Take 2 tablets (400 mg total) by mouth daily., Disp: 60 tablet, Rfl: 1 .  letrozole (FEMARA) 2.5 MG tablet, Take 1 tablet (2.5 mg total) by mouth daily., Disp: 90 tablet, Rfl: 3 .  levalbuterol (XOPENEX) 0.63 MG/3ML nebulizer solution, Take 3 mLs (0.63 mg total) by nebulization every 4 (four) hours as needed for wheezing or shortness of breath.,  Disp: 540 mL, Rfl: 6 .  naproxen sodium (ALEVE) 220 MG tablet, Take 440 mg by mouth 2 (two) times daily as needed (headache). , Disp: , Rfl:  .  TYKERB 250 MG tablet, TAKE 4 TABLETS (1,000 MG TOTAL) BY MOUTH DAILY. TAKE ON AN EMPTY STOMACH, 1 HOUR BEFORE OR 2 HOURS AFTER MEALS., Disp: 150 tablet, Rfl: 3   Garner Nash, DO [facility] 06/05/2018 10:24 AM

## 2018-06-11 MED FILL — TYKERB 250 MG TABLET: 250 | 30 days supply | Qty: 120 | Fill #3

## 2018-06-13 ENCOUNTER — Encounter (HOSPITAL_COMMUNITY): Payer: Self-pay | Admitting: Pulmonary Disease

## 2018-06-19 ENCOUNTER — Telehealth: Payer: Self-pay | Admitting: Pulmonary Disease

## 2018-06-19 NOTE — Telephone Encounter (Signed)
PCCM:  Please print bronchoscopy report in epic to send to patient.  As for the results I have reviewed all of these.  I have also forwarded the results from the [facility] microbiology PCR labs to her infectious disease doctor Dr. Graylon Good.  Thanks  Garner Nash, DO [facility] 06/19/2018 5:13 PM

## 2018-06-19 NOTE — Telephone Encounter (Signed)
This has been printed and placed in the outgoing mail. Nothing further needed.

## 2018-06-19 NOTE — Telephone Encounter (Signed)
ATC pt, no answer. Left message for pt to call back.  Dr. Valeta Harms in the mean time can you review bronch results?

## 2018-06-20 ENCOUNTER — Telehealth: Payer: Self-pay

## 2018-06-20 NOTE — Telephone Encounter (Signed)
Call made to patient to make aware of results per BI. Patient wanted to know if she needs a F/U appointment. Per BI patient no longer needs to see him.   MW please advise as to when you would like to see patient back in office. Thanks.

## 2018-06-20 NOTE — Telephone Encounter (Signed)
Patient returned call, we scheduled 6 wk follow up with MW for 08/01/18.  She is wanting to know if she needs to call Dr. Baxter Flattery to make an appt or if we will be doing that, as she states it is hard to get an appt with Dr. Baxter Flattery and BI was sending her records to Dr. Baxter Flattery.  Patient's CB [phone].

## 2018-06-20 NOTE — Telephone Encounter (Signed)
Called patient unable to reach left message to give us a call back.

## 2018-06-20 NOTE — Telephone Encounter (Signed)
LMTCB. Please schedule patient a 6 week f/u with MW.

## 2018-06-20 NOTE — Telephone Encounter (Signed)
6 weeks sooner if needed for any reasons - key in meantime is f/u with Baxter Flattery in ID

## 2018-06-22 ENCOUNTER — Telehealth: Payer: Self-pay | Admitting: Pulmonary Disease

## 2018-06-22 LAB — FUNGUS CULTURE WITH STAIN

## 2018-06-22 LAB — FUNGUS CULTURE RESULT

## 2018-06-22 LAB — FUNGAL ORGANISM REFLEX

## 2018-06-22 NOTE — Telephone Encounter (Signed)
Called and spoke with Patient. Requested labs faxed to Dr. Graylon Good.  Confirmation received.  Nothing further at this time.

## 2018-06-22 NOTE — Telephone Encounter (Signed)
Spoke with patient, patient wanted to know whether she should contact Dr.Snider, I informed patient she should call and make the appointment and if she has any trouble to call us back. She also wanted to confirm the bronch report and aspergillus results were mailed. I explained the bronch report was mailed however it does not say in the note she needed the aspergillus results. Results mailed. Nothing further is needed at this time.

## 2018-06-25 ENCOUNTER — Encounter: Payer: Self-pay | Admitting: Internal Medicine

## 2018-06-25 ENCOUNTER — Ambulatory Visit: Payer: 59 | Admitting: Internal Medicine

## 2018-06-25 VITALS — BP 107/72 | HR 81 | Ht 62.0 in | Wt 109.0 lb

## 2018-06-25 DIAGNOSIS — B4489 Other forms of aspergillosis: Secondary | ICD-10-CM | POA: Diagnosis not present

## 2018-06-25 DIAGNOSIS — J984 Other disorders of lung: Secondary | ICD-10-CM

## 2018-06-25 MED ORDER — ISAVUCONAZONIUM SULFATE 186 MG PO CAPS: 2.0000 | ORAL_CAPSULE | Freq: Three times a day (TID) | ORAL | 6 refills | Status: DC

## 2018-06-25 NOTE — Progress Notes (Signed)
RFV: follow up for   Patient ID: Melissa [name], female   DOB: 1965/11/01, 52 y.o.   MRN: [id number]  HPI [name] is a 52yo F with hx of bronchiectasis/emphysema, remote hx of coccidiomycosis pulmonary disease s/p lobectomy, now being treated for metastatic breast cancer who has had worsening seirial changes on pulm CT with increasing cavitary lesions. She underwent bronchoscopy roughly 4 weeks ago with cultures being negative, though her BAL was sent for 16S sequencing and aspergillus species was isolated- cx remain negative. She  has had coughing mostly morning and evening - yellowish.mucus like. - but noticing also having light headed ness, mornings mostly, sometimes in the afternoon. She reports not eating breakfast. And mostly lunch but her lunch can be 2 -3pm.  Has tried to drinking ensure can in the morning  No fever chills, and nightsweats  Outpatient Encounter Medications as of 06/25/2018  Medication Sig  . acetaminophen-codeine (TYLENOL #3) 300-30 MG tablet Take 1 tablet by mouth every 4 (four) hours as needed (cough).  Marland Kitchen albuterol (VENTOLIN HFA) 108 (90 BASE) MCG/ACT inhaler Inhale 2 puffs into the lungs every 6 (six) hours as needed.  . budesonide-formoterol (SYMBICORT) 80-4.5 MCG/ACT inhaler Inhale 2 puffs into the lungs 2 (two) times daily.  . Chlorpheniramine-Codeine 2-10 MG/5ML LIQD Take 5 mLs by mouth every 6 (six) hours as needed.  . fluconazole (DIFLUCAN) 200 MG tablet Take 2 tablets (400 mg total) by mouth daily.  Marland Kitchen letrozole (FEMARA) 2.5 MG tablet Take 1 tablet (2.5 mg total) by mouth daily.  Marland Kitchen levalbuterol (XOPENEX) 0.63 MG/3ML nebulizer solution Take 3 mLs (0.63 mg total) by nebulization every 4 (four) hours as needed for wheezing or shortness of breath.  . naproxen sodium (ALEVE) 220 MG tablet Take 440 mg by mouth 2 (two) times daily as needed (headache).   . TYKERB 250 MG tablet TAKE 4 TABLETS (1,000 MG TOTAL) BY MOUTH DAILY. TAKE ON AN EMPTY STOMACH, 1 HOUR BEFORE OR 2  HOURS AFTER MEALS.   No facility-administered encounter medications on file as of 06/25/2018.      Patient Active Problem List   Diagnosis Date Noted  . Mediastinal adenopathy   . H/O coccidioidomycosis 05/16/2018  . DOE (dyspnea on exertion) 05/03/2018  . Diarrhea 04/22/2018  . Cavitary lesion of lung in area of previous cyts in apex of Sup Segment of LLL 03/29/2018  . Aortic atherosclerosis ([place]) 01/22/2018  . Emphysema lung ([place]) 01/22/2018  . CAP (community acquired pneumonia) 11/23/2017  . Metastasis to brain ([place]) 08/04/2017  . Brain metastasis ([place]) 07/27/2017  . Nicotine abuse 07/27/2017  . Migraines 07/27/2017  . HCAP (healthcare-associated pneumonia) 11/26/2016  . Upper airway cough syndrome 08/10/2016  . Abnormal echocardiogram 08/07/2013  . Chest tightness or pressure 08/07/2013  . Hx of radiation therapy   . Edema of left lower extremity 11/19/2012  . Tachycardia 09/20/2012  . Breast cancer of upper-inner quadrant of left female breast ([place]) 06/08/2012  . Obstructive bronchiectasis ([place]) 01/26/2011  . Cigarette smoker 01/26/2011     Health Maintenance Due  Topic Date Due  . TETANUS/TDAP  01/18/1987  . PAP SMEAR-Modifier  11/20/2016  . COLONOSCOPY  01/17/2018    Soc hx: still smoking  Review of Systems Leg cramps SE from meds. Physical Exam   BP 107/72   Pulse 81   Ht _0  (1.575 m)   Wt 109 lb (49.4 kg)   LMP 07/25/2012 Comment: pregnancy waiver form signed 01-18-2018  BMI 19.94 kg/m   Physical Exam  Constitutional:  oriented to person, place, and time. appears well-developed and well-nourished. No distress.  HENT: [place]/AT, PERRLA, no scleral icterus Mouth/Throat: Oropharynx is clear and moist. No oropharyngeal exudate.  Cardiovascular: Normal rate, regular rhythm and normal heart sounds. Exam reveals no gallop and no friction rub.  No murmur heard.  Pulmonary/Chest: Effort normal and breath sounds normal. No respiratory distress.  has no wheezes.  Tubular breath sounds mostly mid to lower lung zones bilaterally Neck = supple, no nuchal rigidity Abdominal: Soft. Bowel sounds are normal.  exhibits no distension. There is no tenderness.  Lymphadenopathy: no cervical adenopathy. No axillary adenopathy Neurological: alert and oriented to person, place, and time.  Skin: Skin is warm and dry. No rash noted. No erythema.  Psychiatric: a normal mood and affect.  behavior is normal.    CBC Lab Results  Component Value Date   WBC 12.0 (H) 05/28/2018   RBC 3.88 05/28/2018   HGB 11.1 (L) 05/28/2018   HCT 34.5 (L) 05/28/2018   PLT 399 05/28/2018   MCV 88.9 05/28/2018   MCH 28.6 05/28/2018   MCHC 32.2 05/28/2018   RDW 16.5 (H) 05/28/2018   LYMPHSABS 2.9 05/28/2018   MONOABS 0.6 05/28/2018   EOSABS 1.0 (H) 05/28/2018    BMET Lab Results  Component Value Date   NA 141 05/28/2018   K 3.8 05/28/2018   CL 105 05/28/2018   CO2 27 05/28/2018   GLUCOSE 95 05/28/2018   BUN 8 05/28/2018   CREATININE 0.84 05/28/2018   CALCIUM 10.1 05/28/2018   GFRNONAA >60 05/28/2018   GFRAA >60 05/28/2018      Assessment and Plan  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 Will plan on using cresemba that should have less side effect. She will meet with our pharmacist for counseling on side effects  - inform dr Lindi Adie. About any drug interaction with tykerb (which she is tolerating)  Health maintenance = will give - flu shot

## 2018-06-25 NOTE — Progress Notes (Signed)
HPI: Melissa [name] is a 52 y.o. female who presents to the [place] clinic today to follow-up with Dr. Baxter Flattery.  Patient Active Problem List   Diagnosis Date Noted  . Mediastinal adenopathy   . H/O coccidioidomycosis 05/16/2018  . DOE (dyspnea on exertion) 05/03/2018  . Diarrhea 04/22/2018  . Cavitary lesion of lung in area of previous cyts in apex of Sup Segment of LLL 03/29/2018  . Aortic atherosclerosis ([place]) 01/22/2018  . Emphysema lung ([place]) 01/22/2018  . CAP (community acquired pneumonia) 11/23/2017  . Metastasis to brain ([place]) 08/04/2017  . Brain metastasis ([place]) 07/27/2017  . Nicotine abuse 07/27/2017  . Migraines 07/27/2017  . HCAP (healthcare-associated pneumonia) 11/26/2016  . Upper airway cough syndrome 08/10/2016  . Abnormal echocardiogram 08/07/2013  . Chest tightness or pressure 08/07/2013  . Hx of radiation therapy   . Edema of left lower extremity 11/19/2012  . Tachycardia 09/20/2012  . Breast cancer of upper-inner quadrant of left female breast ([place]) 06/08/2012  . Obstructive bronchiectasis ([place]) 01/26/2011  . Cigarette smoker 01/26/2011    Patient's Medications  New Prescriptions   ISAVUCONAZONIUM SULFATE (CRESEMBA) 186 MG CAPS    Take 2 capsules (372 mg total) by mouth 3 (three) times daily. X 2 days. Followed by 2 capsules per day thereafter  Previous Medications   ACETAMINOPHEN-CODEINE (TYLENOL #3) 300-30 MG TABLET    Take 1 tablet by mouth every 4 (four) hours as needed (cough).   ALBUTEROL (VENTOLIN HFA) 108 (90 BASE) MCG/ACT INHALER    Inhale 2 puffs into the lungs every 6 (six) hours as needed.   BUDESONIDE-FORMOTEROL (SYMBICORT) 80-4.5 MCG/ACT INHALER    Inhale 2 puffs into the lungs 2 (two) times daily.   CHLORPHENIRAMINE-CODEINE 2-10 MG/5ML LIQD    Take 5 mLs by mouth every 6 (six) hours as needed.   FLUCONAZOLE (DIFLUCAN) 200 MG TABLET    Take 2 tablets (400 mg total) by mouth daily.   LETROZOLE (FEMARA) 2.5 MG TABLET    Take 1 tablet (2.5 mg total)  by mouth daily.   LEVALBUTEROL (XOPENEX) 0.63 MG/3ML NEBULIZER SOLUTION    Take 3 mLs (0.63 mg total) by nebulization every 4 (four) hours as needed for wheezing or shortness of breath.   NAPROXEN SODIUM (ALEVE) 220 MG TABLET    Take 440 mg by mouth 2 (two) times daily as needed (headache).    TYKERB 250 MG TABLET    TAKE 4 TABLETS (1,000 MG TOTAL) BY MOUTH DAILY. TAKE ON AN EMPTY STOMACH, 1 HOUR BEFORE OR 2 HOURS AFTER MEALS.  Modified Medications   No medications on file  Discontinued Medications   No medications on file    Allergies: Allergies  Allergen Reactions  . Aspirin Anaphylaxis and Shortness Of Breath    THROAT CLOSES  . Protonix [Pantoprazole] Nausea Only and Other (See Comments)    Also caused a "film in the mouth" and caused chest pressure  . Iodinated Diagnostic Agents Rash    Past Medical History: Past Medical History:  Diagnosis Date  . Anemia   . Arthritis    knees and hips  . Asthma   . Breast cancer ([place])   . Bronchiectasis ([place])   . Bronchiolitis   . Cancer [facility])    breast cancer 2014  . Complication of anesthesia    bp dropped + desat   . COPD (chronic obstructive pulmonary disease) ([place])   . Dyspnea    DOE  . GERD (gastroesophageal reflux disease)   . H/O  coccidioidomycosis    was reason for lung lobectomy  . Headache(784.0)    due to eye strain or not eating  . History of anemia    no current problem  . History of asthma    as a child  . History of breast cancer 2014   left  . History of chemotherapy    finished 07/17/2013  . History of hiatal hernia    AGE 54  . Hx of radiation therapy 03/25/13-05/06/13   left breast 5000 cGy/25 sessions, left breast boost 1000 cGy/5 sessions  . Pneumonia    LAST FLARE UP 01/2018  . Runny nose 07/30/2013   clear drainage  . Wears dentures    upper    Social History: Social History   Socioeconomic History  . Marital status: Married    Spouse name: Not on file  . Number of children: 2  . Years of  education: Not on file  . Highest education level: Not on file  Occupational History  . Occupation: Secondary school teacher  Social Needs  . Financial resource strain: Not on file  . Food insecurity:    Worry: Not on file    Inability: Not on file  . Transportation needs:    Medical: Not on file    Non-medical: Not on file  Tobacco Use  . Smoking status: Current Every Day Smoker    Packs/day: 0.75    Years: 24.00    Pack years: 18.00    Types: Cigarettes  . Smokeless tobacco: Never Used  Substance and Sexual Activity  . Alcohol use: No  . Drug use: No  . Sexual activity: Yes    Comment: trying to quit  Lifestyle  . Physical activity:    Days per week: Not on file    Minutes per session: Not on file  . Stress: Not on file  Relationships  . Social connections:    Talks on phone: Not on file    Gets together: Not on file    Attends religious service: Not on file    Active member of club or organization: Not on file    Attends meetings of clubs or organizations: Not on file    Relationship status: Not on file  Other Topics Concern  . Not on file  Social History Narrative  . Not on file    Assessment: [name] is here today to follow-up with Dr. Baxter Flattery for her cavitary lesion of her lung.  Preliminary BAL results showed Aspergillus but nothing has grown on her cultures yet.  Patient will be started on Cresemba in the meantime as voriconazole is contraindicated with her Tykerb.  Discussed this with patient. Counseled on possible side effects such as nausea, vomiting, and diarrhea. Counseled to take with or without food and to make sure not to miss any doses.  Explained that she needs to take a loading dose for 2 days of 2 capsules TID x 2 days then 2 capsules daily.  Also explained that we will need to monitor her liver function periodically while on treatment. Inez Catalina was able to activate a co-pay card for patient and she will pick it up at [facility] tomorrow.  Will see her back in 3-4 weeks  for lab monitoring, adherence counseling, and side effect management.  Plan: - Start Cresemba - 2 capsules TID x 2 days then 2 capsules daily - F/u with me 1/21 at [place]. Sione Baumgarten, PharmD, BCIDP, AAHIVP, [place] for Infectious  Disease 06/25/2018, 3:09 PM

## 2018-06-26 MED FILL — CRESEMBA 186 MG CAPSULE: 186 | 24 days supply | Qty: 56 | Fill #0

## 2018-06-29 LAB — FUNGUS CULTURE RESULT

## 2018-06-29 LAB — CBC WITH DIFFERENTIAL/PLATELET
ABSOLUTE MONOCYTES: 580 {cells}/uL (ref 200–950)
Basophils Absolute: 80 cells/uL (ref 0–200)
Basophils Relative: 0.8 %
EOS PCT: 8.8 %
Eosinophils Absolute: 880 cells/uL — ABNORMAL HIGH (ref 15–500)
HCT: 34.5 % — ABNORMAL LOW (ref 35.0–45.0)
Hemoglobin: 11 g/dL — ABNORMAL LOW (ref 11.7–15.5)
Lymphs Abs: 2760 cells/uL (ref 850–3900)
MCH: 28 pg (ref 27.0–33.0)
MCHC: 31.9 g/dL — ABNORMAL LOW (ref 32.0–36.0)
MCV: 87.8 fL (ref 80.0–100.0)
MONOS PCT: 5.8 %
MPV: 8.9 fL (ref 7.5–12.5)
NEUTROS PCT: 57 %
Neutro Abs: 5700 cells/uL (ref 1500–7800)
PLATELETS: 465 10*3/uL — AB (ref 140–400)
RBC: 3.93 10*6/uL (ref 3.80–5.10)
RDW: 14.3 % (ref 11.0–15.0)
TOTAL LYMPHOCYTE: 27.6 %
WBC: 10 10*3/uL (ref 3.8–10.8)

## 2018-06-29 LAB — COMPLETE METABOLIC PANEL WITH GFR
AG Ratio: 1.1 (calc) (ref 1.0–2.5)
ALBUMIN MSPROF: 4.2 g/dL (ref 3.6–5.1)
ALT: 12 U/L (ref 6–29)
AST: 16 U/L (ref 10–35)
Alkaline phosphatase (APISO): 112 U/L (ref 33–130)
BUN: 8 mg/dL (ref 7–25)
CALCIUM: 10.2 mg/dL (ref 8.6–10.4)
CO2: 25 mmol/L (ref 20–32)
CREATININE: 0.91 mg/dL (ref 0.50–1.05)
Chloride: 102 mmol/L (ref 98–110)
GFR, EST NON AFRICAN AMERICAN: 74 mL/min/{1.73_m2} (ref >=60)
GFR, Est African American: 85 mL/min/{1.73_m2} (ref >=60)
Globulin: 3.7 g/dL (calc) (ref 1.9–3.7)
Glucose, Bld: 83 mg/dL (ref 65–99)
Potassium: 4.6 mmol/L (ref 3.5–5.3)
SODIUM: 138 mmol/L (ref 135–146)
TOTAL PROTEIN: 7.9 g/dL (ref 6.1–8.1)
Total Bilirubin: 0.2 mg/dL (ref 0.2–1.2)

## 2018-06-29 LAB — FUNGUS CULTURE WITH STAIN

## 2018-06-29 LAB — ASPERGILLUS ANTIGEN,SERUM
Aspergillus Ag, EIA: DETECTED — CR
INDEX VAL: 0.58 — AB (ref <0.50)

## 2018-06-29 LAB — FUNGAL ORGANISM REFLEX

## 2018-07-03 ENCOUNTER — Other Ambulatory Visit: Payer: Self-pay | Admitting: Radiation Therapy

## 2018-07-03 DIAGNOSIS — C7931 Secondary malignant neoplasm of brain: Secondary | ICD-10-CM

## 2018-07-03 DIAGNOSIS — C7949 Secondary malignant neoplasm of other parts of nervous system: Principal | ICD-10-CM

## 2018-07-06 ENCOUNTER — Telehealth: Payer: Self-pay | Admitting: Radiation Therapy

## 2018-07-06 LAB — ACID FAST CULTURE WITH REFLEXED SENSITIVITIES (MYCOBACTERIA)
Acid Fast Culture: NEGATIVE
Acid Fast Culture: NEGATIVE

## 2018-07-06 LAB — ACID FAST CULTURE WITH REFLEXED SENSITIVITIES
ACID FAST CULTURE - AFSCU3: NEGATIVE
ACID FAST CULTURE - AFSCU3: NEGATIVE

## 2018-07-06 NOTE — Telephone Encounter (Signed)
Left a detailed message about Lorrayne's upcoming brain MRI and visit with Ashlyn. Message included the appointment dates and times and a request to call me back if she has questions or conflicts.   Mont Dutton R.T.(R)(T) Special Procedures Navigator

## 2018-07-10 LAB — ORGANISM IDENTIFICATION, MOLD

## 2018-07-10 LAB — MOLD ORGANISM REFLEX

## 2018-07-12 ENCOUNTER — Telehealth: Payer: Self-pay

## 2018-07-12 MED FILL — TYKERB 250 MG TABLET: 250 | 30 days supply | Qty: 120 | Fill #4

## 2018-07-12 NOTE — Telephone Encounter (Signed)
Oral Oncology Patient Advocate Encounter  I received notification from [place] that the patients insurance did not cover any of the cost of her Tykerb. I did an eligibility check and it appears that she no longer has insurance coverage. If she does not have insurance coverage I can assist in getting her signed up for manufacturer assistance.   I called the patient and left a voicemail to return my call  [place] Patient [place] Phone [phone] Fax [phone]

## 2018-07-12 NOTE — Telephone Encounter (Signed)
Oral Oncology Patient Advocate Encounter  I was able to get the patient a copay card with [employer]. I shared it with [place] and it is as follows. BIN: [id number] PCN: [id number] GRP: [id number] ID# [id number]  The card only covered $101.25 of the $6211.71 copay. I called the copay help desk and they gave me a credit card number that will pay the pharmacy during check out. I gave this information to [place].  Card #[id number] CVC# [id number] Exp. 07/10/2021  We also secured a cancer care grant but did not use it. The information is as follows. BIN: [id number] ID: [id number] PCN: [id number] GROUP: [id number]  I called the patient and gave her the good news, she verbalized understanding and great appreciation.  [place] Patient [place] Phone [phone] Fax [phone]

## 2018-07-13 LAB — CULTURE, RESPIRATORY

## 2018-07-13 LAB — CULTURE, RESPIRATORY W GRAM STAIN

## 2018-07-17 ENCOUNTER — Other Ambulatory Visit: Payer: 59

## 2018-07-20 ENCOUNTER — Telehealth: Payer: Self-pay

## 2018-07-20 NOTE — Telephone Encounter (Signed)
Per patient she does not have medications called in to her pharmacy for CT scan on 07/25/18. Medication for dye allergy protocol prior to CT scan has been called in to patient's pharmacy.

## 2018-07-24 ENCOUNTER — Telehealth: Payer: Self-pay

## 2018-07-24 NOTE — Telephone Encounter (Signed)
Called pt to review her premeds for upcoming CT tomorrow. Pt states that she received all her medication and schedule of when to take prednisone and benadryl. No further needs at this time.

## 2018-07-25 ENCOUNTER — Ambulatory Visit (HOSPITAL_COMMUNITY)
Admission: RE | Admit: 2018-07-25 | Discharge: 2018-07-25 | Disposition: A | Discharge status: Home / Self Care | Payer: 59 | Source: Ambulatory Visit | Attending: Hematology and Oncology | Admitting: Hematology and Oncology

## 2018-07-25 DIAGNOSIS — R911 Solitary pulmonary nodule: Secondary | ICD-10-CM | POA: Diagnosis not present

## 2018-07-25 MED ORDER — IOHEXOL 300 MG/ML  SOLN
75.0000 mL | Freq: Once | INTRAMUSCULAR | Status: AC | PRN
  Administered 2018-07-25: 75 mL via INTRAVENOUS

## 2018-07-25 MED ORDER — SODIUM CHLORIDE (PF) 0.9 % IJ SOLN
INTRAMUSCULAR | Status: AC
  Filled 2018-07-25: qty 50

## 2018-07-26 NOTE — Progress Notes (Signed)
Patient Care Team: Patient, [name] as PCP - General (General Practice) [name], MD as Referring Physician (Emergency Medicine)  DIAGNOSIS:    ICD-10-CM   1. Metastasis to brain [facility]) C79.31   2. Malignant neoplasm of upper-inner quadrant of left breast in female, estrogen receptor positive ([place]) C50.212    Z17.0     SUMMARY OF ONCOLOGIC HISTORY:   Breast cancer of upper-inner quadrant of left female breast ([place])   06/08/2012 Initial Diagnosis    invasive ductal carcinoma that was ER positive PR positive HER-2/neu positive measuring 3.1 cm by MRI criteria. Ki-67 was 70% HER-2 was amplified with a ratio 2.91    07/12/2012 - 07/17/2013 Neo-Adjuvant Chemotherapy    TCH 6 followed by Herceptin maintenance    12/11/2012 Surgery    Left breast lumpectomy: 1.8 cm tumor 1 positive sentinel node, axillary lymph node dissection 02/08/2013 showed 0/13 lymph nodes    03/25/2013 - 05/06/2013 Radiation Therapy    Adjuvant radiation therapy    06/05/2013 - 07/20/2017 Anti-estrogen oral therapy    Tamoxifen 20 mg daily    07/27/2017 Relapse/Recurrence    MRI Brain: 3.4 x 2.9 x 2.9 cm RIGHT frontal lobe mass with imaging characteristics of solitary metastasis. Extensive vasogenic edema resulting in 9 mm RIGHT to LEFT midline shift. Equivocal very early LEFT ventricle entrapment.     08/04/2017 Surgery    Rt frontal brain resection: Poorly differentiated tumor IHC suggests breast primary ER and PR Positive    08/25/2017 - 09/04/2017 Radiation Therapy    Stereotactic radiation    09/18/2017 -  Anti-estrogen oral therapy    Lapatinib with letrozole    12/18/2017 - 12/19/2017 Radiation Therapy    New right parietal lobe metastases status post SRS     CHIEF COMPLIANT: Follow-up on lapatinib with letrozole  INTERVAL HISTORY: Melissa [name] is a 53 y.o. with above-mentioned history of metastatic breast cancer with brain metastasis s/p resection who is currently on letrozole therapy. A  Chest CT from 07/25/18 showed no evidence of metastatic disease in the lungs and mucus plugging in the left upper lung. A bronchoscopy on 05/23/18 revealed obstructive bronchiectasis and she was put on medication which she reports has not helped much. She presents to the clinic alone today. She reports fatigue, difficulty sleeping, coughing at night, and pain on her left side that radiates to her back. She denies any rashes. She asked for a refill of Codeine. She will continue to follow-up with her pulmonologist, infectious disease doctor, and pharmacist.   REVIEW OF SYSTEMS:   Constitutional: Denies fevers, chills or abnormal weight loss (+) fatigue Eyes: Denies blurriness of vision Ears, nose, mouth, throat, and face: Denies mucositis or sore throat Respiratory: Denies dyspnea or wheezes (+) coughing at night Cardiovascular: Denies palpitation, chest discomfort Gastrointestinal:  Denies nausea, heartburn or change in bowel habits Skin: Denies abnormal skin rashes MSK: (+) pain in left side radiating to back Lymphatics: Denies new lymphadenopathy or easy bruising Neurological: Denies numbness, tingling or new weaknesses Behavioral/Psych: Mood is stable, no new changes (+) difficulty sleeping Extremities: No lower extremity edema Breast: denies any pain or lumps or nodules in either breasts All other systems were reviewed with the patient and are negative.  I have reviewed the past medical history, past surgical history, social history and family history with the patient and they are unchanged from previous note.  ALLERGIES:  is allergic to aspirin; protonix [pantoprazole]; and iodinated diagnostic agents.  MEDICATIONS:  Current Outpatient  Medications  Medication Sig Dispense Refill  . acetaminophen-codeine (TYLENOL #3) 300-30 MG tablet Take 1 tablet by mouth every 4 (four) hours as needed (cough). 30 tablet 0  . albuterol (VENTOLIN HFA) 108 (90 BASE) MCG/ACT inhaler Inhale 2 puffs into the  lungs every 6 (six) hours as needed. 1 Inhaler 3  . budesonide-formoterol (SYMBICORT) 80-4.5 MCG/ACT inhaler Inhale 2 puffs into the lungs 2 (two) times daily. 1 Inhaler 5  . Chlorpheniramine-Codeine 2-10 MG/5ML LIQD Take 5 mLs by mouth every 6 (six) hours as needed. 200 mL 0  . fluconazole (DIFLUCAN) 200 MG tablet Take 2 tablets (400 mg total) by mouth daily. 60 tablet 1  . Isavuconazonium Sulfate (CRESEMBA) 186 MG CAPS Take 2 capsules (372 mg total) by mouth 3 (three) times daily. X 2 days. Followed by 2 capsules per day thereafter 60 capsule 6  . letrozole (FEMARA) 2.5 MG tablet Take 1 tablet (2.5 mg total) by mouth daily. 30 tablet 11  . levalbuterol (XOPENEX) 0.63 MG/3ML nebulizer solution Take 3 mLs (0.63 mg total) by nebulization every 4 (four) hours as needed for wheezing or shortness of breath. 540 mL 6  . naproxen sodium (ALEVE) 220 MG tablet Take 440 mg by mouth 2 (two) times daily as needed (headache).     . TYKERB 250 MG tablet TAKE 4 TABLETS (1,000 MG TOTAL) BY MOUTH DAILY. TAKE ON AN EMPTY STOMACH, 1 HOUR BEFORE OR 2 HOURS AFTER MEALS. 150 tablet 3   No current facility-administered medications for this visit.     PHYSICAL EXAMINATION: ECOG PERFORMANCE STATUS: 1 - Symptomatic but completely ambulatory  Vitals:   07/27/18 1137  BP: 115/73  Pulse: 92  Resp: 18  Temp: 98.7 F (37.1 C)  SpO2: 99%   Filed Weights   07/27/18 1137  Weight: 111 lb 3.2 oz (50.4 kg)    GENERAL: alert, no distress and comfortable SKIN: skin color, texture, turgor are normal, no rashes or significant lesions EYES: normal, Conjunctiva are pink and non-injected, sclera clear OROPHARYNX: no exudate, no erythema and lips, buccal mucosa, and tongue normal  NECK: supple, thyroid normal size, non-tender, without nodularity LYMPH: no palpable lymphadenopathy in the cervical, axillary or inguinal LUNGS: clear to auscultation and percussion with normal breathing effort HEART: regular rate & rhythm and  no murmurs and no lower extremity edema ABDOMEN: abdomen soft, non-tender and normal bowel sounds MUSCULOSKELETAL: no cyanosis of digits and no clubbing  NEURO: alert & oriented x 3 with fluent speech, no focal motor/sensory deficits EXTREMITIES: No lower extremity edema  LABORATORY DATA:  I have reviewed the data as listed CMP Latest Ref Rng & Units 07/27/2018 06/25/2018 05/28/2018  Glucose 70 - 99 mg/dL 120(H) 83 95  BUN 6 - 20 mg/dL '13 8 8  '$ Creatinine 0.44 - 1.00 mg/dL 0.92 0.91 0.84  Sodium 135 - 145 mmol/L 140 138 141  Potassium 3.5 - 5.1 mmol/L 3.6 4.6 3.8  Chloride 98 - 111 mmol/L 103 102 105  CO2 22 - 32 mmol/L '27 25 27  '$ Calcium 8.9 - 10.3 mg/dL 9.8 10.2 10.1  Total Protein 6.5 - 8.1 g/dL 8.0 7.9 8.1  Total Bilirubin 0.3 - 1.2 mg/dL <0.2(L) 0.2 <0.2(L)  Alkaline Phos 38 - 126 U/L 147(H) - 145(H)  AST 15 - 41 U/L 13(L) 16 30  ALT 0 - 44 U/L 23 12 40    Lab Results  Component Value Date   WBC 12.9 (H) 07/27/2018   HGB 10.6 (L) 07/27/2018   HCT 32.9 (  L) 07/27/2018   MCV 87.3 07/27/2018   PLT 431 (H) 07/27/2018   NEUTROABS 9.0 (H) 07/27/2018    ASSESSMENT & PLAN:  Breast cancer of upper-inner quadrant of left female breast (HCC) 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 Anti Her 2 therapy with Lapatinibstarted 09/17/2017 3.I discontinuedtamoxifen and started her on letrozole 2.5 mg daily.  Lapatinib toxicities:Currently on 1000 mglapatinibwith Letrozole 2.5 mg Occasional diarrhea. She is working and is very happy about  it  Stereotactic radiosurgery 12/19/2017 to the new right parietal lobe metastases.  CT CAP: 03/23/18: LUL cavitary arch destortion with ground glass attenuation subpleural LUL (Radiation pneumonitis vs Inf); Inc in size of left mediastinal LN (non specific), LUL nodule 4 mm  Bronchoscopy and biopsy revealed aspergillosis Patient is seeing infectious disease as well as pulmonary with Dr. Melvyn Novas. She completed antifungal therapy and is planning to see Dr. Baxter Flattery.  CT chest 07/25/2018: Prior left upper lobectomy, cylindrical bronchiectasis left lower lobe with thickening and mucous plugging, inflammatory changes are improved.  New groundglass nodularity right lung  MRI brain 05/18/2018: Right frontal resection cavity 4 mm Return to clinic in4 months for follow-up.    No orders of the defined types were placed in this encounter.  The patient has a good understanding of the overall plan. she agrees with it. she will call with any problems that may develop before the next visit here.  Nicholas Lose, MD 07/27/2018  Julious Oka Dorshimer am acting as scribe for Dr. Nicholas Lose.  I have reviewed the above documentation for accuracy and completeness, and I agree with the above.

## 2018-07-27 ENCOUNTER — Telehealth: Payer: Self-pay | Admitting: Hematology and Oncology

## 2018-07-27 ENCOUNTER — Inpatient Hospital Stay: Payer: 59 | Attending: Hematology and Oncology

## 2018-07-27 ENCOUNTER — Inpatient Hospital Stay (HOSPITAL_BASED_OUTPATIENT_CLINIC_OR_DEPARTMENT_OTHER): Payer: 59 | Admitting: Hematology and Oncology

## 2018-07-27 VITALS — BP 115/73 | HR 92 | Temp 98.7°F | Resp 18 | Ht 62.0 in | Wt 111.2 lb

## 2018-07-27 DIAGNOSIS — R52 Pain, unspecified: Secondary | ICD-10-CM | POA: Diagnosis not present

## 2018-07-27 DIAGNOSIS — G4700 Insomnia, unspecified: Secondary | ICD-10-CM

## 2018-07-27 DIAGNOSIS — C7931 Secondary malignant neoplasm of brain: Secondary | ICD-10-CM

## 2018-07-27 DIAGNOSIS — R911 Solitary pulmonary nodule: Secondary | ICD-10-CM

## 2018-07-27 DIAGNOSIS — C50212 Malignant neoplasm of upper-inner quadrant of left female breast: Secondary | ICD-10-CM | POA: Insufficient documentation

## 2018-07-27 DIAGNOSIS — Z170 Estrogen receptor positive status [ER+]: Secondary | ICD-10-CM | POA: Diagnosis not present

## 2018-07-27 DIAGNOSIS — R5383 Other fatigue: Secondary | ICD-10-CM

## 2018-07-27 DIAGNOSIS — C773 Secondary and unspecified malignant neoplasm of axilla and upper limb lymph nodes: Secondary | ICD-10-CM | POA: Diagnosis not present

## 2018-07-27 DIAGNOSIS — R05 Cough: Secondary | ICD-10-CM

## 2018-07-27 LAB — CBC WITH DIFFERENTIAL (CANCER CENTER ONLY)
Abs Immature Granulocytes: 0.05 10*3/uL (ref 0.00–0.07)
Basophils Absolute: 0.1 10*3/uL (ref 0.0–0.1)
Basophils Relative: 1 %
EOS ABS: 0.5 10*3/uL (ref 0.0–0.5)
EOS PCT: 4 %
HCT: 32.9 % — ABNORMAL LOW (ref 36.0–46.0)
Hemoglobin: 10.6 g/dL — ABNORMAL LOW (ref 12.0–15.0)
IMMATURE GRANULOCYTES: 0 %
LYMPHS ABS: 2.6 10*3/uL (ref 0.7–4.0)
Lymphocytes Relative: 20 %
MCH: 28.1 pg (ref 26.0–34.0)
MCHC: 32.2 g/dL (ref 30.0–36.0)
MCV: 87.3 fL (ref 80.0–100.0)
MONO ABS: 0.6 10*3/uL (ref 0.1–1.0)
MONOS PCT: 5 %
Neutro Abs: 9 10*3/uL — ABNORMAL HIGH (ref 1.7–7.7)
Neutrophils Relative %: 70 %
Platelet Count: 431 10*3/uL — ABNORMAL HIGH (ref 150–400)
RBC: 3.77 MIL/uL — ABNORMAL LOW (ref 3.87–5.11)
RDW: 18 % — AB (ref 11.5–15.5)
WBC Count: 12.9 10*3/uL — ABNORMAL HIGH (ref 4.0–10.5)
nRBC: 0 % (ref 0.0–0.2)

## 2018-07-27 LAB — CMP (CANCER CENTER ONLY)
ALBUMIN: 3.6 g/dL (ref 3.5–5.0)
ALK PHOS: 147 U/L — AB (ref 38–126)
ALT: 23 U/L (ref 0–44)
AST: 13 U/L — ABNORMAL LOW (ref 15–41)
Anion gap: 10 (ref 5–15)
BUN: 13 mg/dL (ref 6–20)
CALCIUM: 9.8 mg/dL (ref 8.9–10.3)
CO2: 27 mmol/L (ref 22–32)
CREATININE: 0.92 mg/dL (ref 0.44–1.00)
Chloride: 103 mmol/L (ref 98–111)
GFR, Est AFR Am: >60 mL/min (ref >60)
GFR, Estimated: >60 mL/min (ref >60)
GLUCOSE: 120 mg/dL — AB (ref 70–99)
Potassium: 3.6 mmol/L (ref 3.5–5.1)
SODIUM: 140 mmol/L (ref 135–145)
Total Bilirubin: <0.2 mg/dL — ABNORMAL LOW (ref 0.3–1.2)
Total Protein: 8 g/dL (ref 6.5–8.1)

## 2018-07-27 MED ORDER — LETROZOLE 2.5 MG PO TABS: 2.5000 mg | ORAL_TABLET | Freq: Every day | ORAL | 11 refills | Status: DC

## 2018-07-27 MED ORDER — CHLORPHENIRAMINE-CODEINE 2-10 MG/5ML PO LIQD: 5.0000 mL | Freq: Four times a day (QID) | ORAL | 0 refills | Status: DC | PRN

## 2018-07-27 NOTE — Telephone Encounter (Signed)
Gave avs and calendar ° °

## 2018-07-27 NOTE — Assessment & Plan Note (Signed)
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 Anti Her 2 therapy with Lapatinibstarted 09/17/2017 3.I discontinuedtamoxifen and started her on letrozole 2.5 mg daily.  Lapatinib toxicities:Currently on 1000 mglapatinibwith Letrozole 2.5 mg Occasional diarrhea. She is working and is very happy about it  Stereotactic radiosurgery 12/19/2017 to the new right parietal lobe metastases.  CT CAP: 03/23/18: LUL cavitary arch destortion with ground glass attenuation subpleural LUL (Radiation pneumonitis vs Inf); Inc in size of left mediastinal LN (non specific), LUL nodule 4 mm  Bronchoscopy and biopsy revealed aspergillosis Patient is seeing infectious disease as well as pulmonary with Dr. Melvyn Novas. She completed antifungal therapy and is planning to see Dr. Baxter Flattery.  CT chest 07/25/2018: Prior left upper lobectomy, cylindrical bronchiectasis left lower lobe with thickening and mucous plugging, inflammatory changes are improved.  New groundglass nodularity right lung  MRI brain 05/18/2018: Right frontal resection cavity 4 mm Return to clinic in4 months for follow-up.

## 2018-07-30 ENCOUNTER — Telehealth: Payer: Self-pay | Admitting: Pharmacist

## 2018-07-30 NOTE — Telephone Encounter (Signed)
Patient called about her Cresemba.  She was supposed to see me tomorrow but cancelled. She was very worried about running out of her Cresemba before seeing Dr. Baxter Flattery next Monday 1/27. I told her that all she needed to do was call [facility] for a refill. They will order it today and she will pick up sometime this week before she runs out. Patient wishes to not reschedule with me and just see Dr. Baxter Flattery next week.

## 2018-07-31 ENCOUNTER — Ambulatory Visit: Payer: 59

## 2018-07-31 MED FILL — CRESEMBA 186 MG CAPSULE: 186 | 24 days supply | Qty: 56 | Fill #1

## 2018-08-01 ENCOUNTER — Ambulatory Visit: Payer: 59 | Admitting: Internal Medicine

## 2018-08-03 ENCOUNTER — Other Ambulatory Visit: Payer: Self-pay | Admitting: Hematology and Oncology

## 2018-08-03 DIAGNOSIS — Z170 Estrogen receptor positive status [ER+]: Principal | ICD-10-CM

## 2018-08-03 DIAGNOSIS — C50212 Malignant neoplasm of upper-inner quadrant of left female breast: Secondary | ICD-10-CM

## 2018-08-06 ENCOUNTER — Other Ambulatory Visit: Payer: Self-pay | Admitting: Pharmacist

## 2018-08-06 ENCOUNTER — Ambulatory Visit: Payer: 59 | Admitting: Internal Medicine

## 2018-08-06 ENCOUNTER — Encounter: Payer: Self-pay | Admitting: Internal Medicine

## 2018-08-06 VITALS — BP 109/74 | HR 79 | Temp 98.6°F | Wt 110.0 lb

## 2018-08-06 DIAGNOSIS — B4489 Other forms of aspergillosis: Secondary | ICD-10-CM | POA: Diagnosis not present

## 2018-08-06 DIAGNOSIS — R053 Chronic cough: Secondary | ICD-10-CM

## 2018-08-06 DIAGNOSIS — J984 Other disorders of lung: Secondary | ICD-10-CM

## 2018-08-06 DIAGNOSIS — R5383 Other fatigue: Secondary | ICD-10-CM

## 2018-08-06 DIAGNOSIS — C50212 Malignant neoplasm of upper-inner quadrant of left female breast: Secondary | ICD-10-CM

## 2018-08-06 DIAGNOSIS — R05 Cough: Secondary | ICD-10-CM

## 2018-08-06 DIAGNOSIS — Z170 Estrogen receptor positive status [ER+]: Principal | ICD-10-CM

## 2018-08-06 MED ORDER — CHLORPHENIRAMINE-CODEINE 2-10 MG/5ML PO LIQD: 5.0000 mL | Freq: Four times a day (QID) | ORAL | 0 refills | Status: DC | PRN

## 2018-08-06 MED ORDER — LAPATINIB DITOSYLATE 250 MG PO TABS: 1000.0000 mg | ORAL_TABLET | Freq: Every day | ORAL | 3 refills | Status: DC

## 2018-08-06 NOTE — Telephone Encounter (Signed)
Oral Oncology Pharmacist Encounter  Received notification from the [facility] long outpatient pharmacy that patient's lapatinib prescription that was sent to the pharmacy on 08/03/2018 had a quantity # that did not match directions.  New prescription for Tykerb (lapatinib) 250 mg tablets, take 4 tablets (1000 mg) by mouth once daily on an empty stomach, 1 hour before or 1 hour before food, has been E scribed to the [facility] long outpatient pharmacy. Quantity #120, refills = 3  Noted patient started on Cresemba for antifungal infection, category C interaction with Tykerb as it is a moderate CYP3A4 inhibitor, leading to possible decreased metabolism and increased system,ic exposure to patient's Tykerb. Patient is aware of interaction and is monitoring for increased Tykerb side effects. No change to current therapy is indicated at this time.  Johny Drilling, PharmD, BCPS, BCOP  08/06/2018 8:34 AM Oral Oncology Clinic [phone]

## 2018-08-06 NOTE — Progress Notes (Signed)
RFV: follow up for pulmonary aspegillosis  Patient ID: Melissa [name], female   DOB: Feb 19, 1966, 53 y.o.   MRN: [id number]  HPI Melissa [name] is a 53yo F with metastatic breast ca with hx of emphysema, bronchiectasis, hx of pulmonary cocci, who was found to have worsening pulmonary cavitary lesions. She underwent bronch with cx showing aspergillus. She has been started on isuvaconazole so that it doesn't interact with her cancer medications. She is tolerating her abtx but mostly feels fatigue.  She had repeat staging CT by pulmonology which general shows improvement since been antifungal for just over a month.  Outpatient Encounter Medications as of 08/06/2018  Medication Sig  . acetaminophen-codeine (TYLENOL #3) 300-30 MG tablet Take 1 tablet by mouth every 4 (four) hours as needed (cough).  Marland Kitchen albuterol (VENTOLIN HFA) 108 (90 BASE) MCG/ACT inhaler Inhale 2 puffs into the lungs every 6 (six) hours as needed.  . budesonide-formoterol (SYMBICORT) 80-4.5 MCG/ACT inhaler Inhale 2 puffs into the lungs 2 (two) times daily.  . Chlorpheniramine-Codeine 2-10 MG/5ML LIQD Take 5 mLs by mouth every 6 (six) hours as needed.  . Isavuconazonium Sulfate (CRESEMBA) 186 MG CAPS Take 2 capsules (372 mg total) by mouth 3 (three) times daily. X 2 days. Followed by 2 capsules per day thereafter  . lapatinib (TYKERB) 250 MG tablet Take 4 tablets (1,000 mg total) by mouth daily. Take on an empty stomach, at least 1 hour before or 1 hour after meals.  Marland Kitchen letrozole (FEMARA) 2.5 MG tablet Take 1 tablet (2.5 mg total) by mouth daily.  Marland Kitchen levalbuterol (XOPENEX) 0.63 MG/3ML nebulizer solution Take 3 mLs (0.63 mg total) by nebulization every 4 (four) hours as needed for wheezing or shortness of breath.  . naproxen sodium (ALEVE) 220 MG tablet Take 440 mg by mouth 2 (two) times daily as needed (headache).   . fluconazole (DIFLUCAN) 200 MG tablet Take 2 tablets (400 mg total) by mouth daily.   No facility-administered encounter  medications on file as of 08/06/2018.      Patient Active Problem List   Diagnosis Date Noted  . Mediastinal adenopathy   . H/O coccidioidomycosis 05/16/2018  . DOE (dyspnea on exertion) 05/03/2018  . Diarrhea 04/22/2018  . Cavitary lesion of lung in area of previous cyts in apex of Sup Segment of LLL 03/29/2018  . Aortic atherosclerosis ([place]) 01/22/2018  . Emphysema lung ([place]) 01/22/2018  . CAP (community acquired pneumonia) 11/23/2017  . Metastasis to brain ([place]) 08/04/2017  . Brain metastasis ([place]) 07/27/2017  . Nicotine abuse 07/27/2017  . Migraines 07/27/2017  . HCAP (healthcare-associated pneumonia) 11/26/2016  . Upper airway cough syndrome 08/10/2016  . Abnormal echocardiogram 08/07/2013  . Chest tightness or pressure 08/07/2013  . Hx of radiation therapy   . Edema of left lower extremity 11/19/2012  . Tachycardia 09/20/2012  . Breast cancer of upper-inner quadrant of left female breast ([place]) 06/08/2012  . Obstructive bronchiectasis ([place]) 01/26/2011  . Cigarette smoker 01/26/2011     Health Maintenance Due  Topic Date Due  . TETANUS/TDAP  01/18/1987  . PAP SMEAR-Modifier  11/20/2016  . COLONOSCOPY  01/17/2018     Review of Systems  Physical Exam   BP 109/74   Pulse 79   Temp 98.6 F (37 C)   Wt 110 lb (49.9 kg)   LMP 07/25/2012 Comment: pregnancy waiver form signed 01-18-2018  BMI 20.12 kg/m   Physical Exam  Constitutional:  oriented to person, place, and time. appears well-developed and well-nourished. No distress.  HENT:  [place]/AT, PERRLA, no scleral icterus Mouth/Throat: Oropharynx is clear and moist. No oropharyngeal exudate.  Cardiovascular: Normal rate, regular rhythm and normal heart sounds. Exam reveals no gallop and no friction rub.  No murmur heard.  Pulmonary/Chest: [place] normal and breath sounds [place].no rhonchi. No respiratory distress.  has no wheezes.  Neck = supple, no nuchal rigidity Lymphadenopathy: no cervical adenopathy. No axillary  adenopathy Neurological: alert and oriented to person, place, and time.  Skin: Skin is warm and dry. No rash noted. No erythema.  Psychiatric: a normal mood and affect.  behavior is normal.   CBC Lab Results  Component Value Date   WBC 12.9 (H) 07/27/2018   RBC 3.77 (L) 07/27/2018   HGB 10.6 (L) 07/27/2018   HCT 32.9 (L) 07/27/2018   PLT 431 (H) 07/27/2018   MCV 87.3 07/27/2018   MCH 28.1 07/27/2018   MCHC 32.2 07/27/2018   RDW 18.0 (H) 07/27/2018   LYMPHSABS 2.6 07/27/2018   MONOABS 0.6 07/27/2018   EOSABS 0.5 07/27/2018    BMET Lab Results  Component Value Date   NA 140 07/27/2018   K 3.6 07/27/2018   CL 103 07/27/2018   CO2 27 07/27/2018   GLUCOSE 120 (H) 07/27/2018   BUN 13 07/27/2018   CREATININE 0.92 07/27/2018   CALCIUM 9.8 07/27/2018   GFRNONAA >60 07/27/2018   GFRAA >60 07/27/2018      Assessment and Plan Pulmonary aspergillus =  - plan on continue cresemba  Chronic cough = - refill cough medicine - pulmonary hygiene - at next labs - hepatic panel -

## 2018-08-07 ENCOUNTER — Encounter: Payer: Self-pay | Admitting: Internal Medicine

## 2018-08-07 ENCOUNTER — Ambulatory Visit: Payer: 59 | Admitting: Internal Medicine

## 2018-08-07 DIAGNOSIS — F17210 Nicotine dependence, cigarettes, uncomplicated: Secondary | ICD-10-CM | POA: Diagnosis not present

## 2018-08-07 DIAGNOSIS — J479 Bronchiectasis, uncomplicated: Secondary | ICD-10-CM

## 2018-08-08 ENCOUNTER — Encounter: Payer: Self-pay | Admitting: Internal Medicine

## 2018-08-08 NOTE — Patient Instructions (Signed)
The key is to stop smoking completely before smoking completely stops you!  For smoking cessation classes call [phone]       No change in meds    Follow up every 6 months

## 2018-08-08 NOTE — Assessment & Plan Note (Signed)
Status post left upper lobectomy for ? [place] CT chest   01/26/2011 Focal bronchiectasis / scarring medially in the left lung apex with  surrounding inflammatory changes  - Pneumovax 08/12/11  And prevnar 08/09/2016  - PFT's  08/09/2016  FEV1 2.05 (77 % ) ratio 70  p 5 % improvement from saba p dulera 100 x2  prior to study with DLCO  46 % corrects to 55  % for alv volume   - alpha one screening 03/27/2018  MM 267  - FOB/BAL 05/23/18 Pos Aspergillus > ID following  - 08/07/2018  After extensive coaching inhaler device,  effectiveness =    75% (short Ti)   Not really clear to me that aspergillus is a pathogen here but clearly has issues with poor mc function exac by smoking which she shows no real motivation or inclination to stop so all we can offer is continue present rx with low dose symbicort and f/u for exac or q 6 m whichever comes first

## 2018-08-08 NOTE — Progress Notes (Signed)
Subjective:     Patient ID: Melissa [name], female   DOB: 1966-05-01 .   MRN: [id number]    Brief patient profile:  34  yowf active smoker dx'd with [place] in [place] requiring [place] in 1997 and 3 years of antifungal rx with fluconazole with residual sob heavy doe and sev times a year pain in L shoulder and lower back up to couple times a year lasts up  to a couple  of weeks maybe better with abx, maybe some worse joint pains,  Sometimes some sweats, occ with nasty mucus but no blood referred to pulmonary clinic for recurrent cp 01/2011 with documented Bronchiectasis in 2012    History of Present Illness  01/26/2011 Initial pulmonary office eval cc recurrent pleuritic L ant chest  pain, more severe than usual episodes,  rad neck,  Onset x 2 weeks with abrupt onset,  no better on lodine> stopped.   No sweats or nasty mucus now. Some better at day of ov p rx = dex and zithromax but  some sorethroat since dex with overt hb. rec ct chest POS BRONCHIECTASIS    Nov 2013 Breast Ca > lumpectomy with pos node> RT and chemo Finished Jan 2015     07/15/2016 acute extended ov/Talayah Picardi re: ? Bronchiectasis /dulera 100 2 bid maint / still smoking Chief Complaint  Patient presents with  . Acute Visit    lower right back pain x 1 wk- radiating to her right rib area over the past 2 days. She feels discomfort when she takes a deep breath.  She also c/o episodes of feeling light headed. She is not having any SOB and no more cough than usual.    acute onset one week prior to OV  Very Similar pattern/characteristics to prev flares of bronchiectasis related cp on L side but this CP is located R post rad anteriorly / worse with breathing/ worse supine/ moderately severe No sob or need for rescue saba / some better with tylenol  rec levaquin 750 mg one daily x 5 days  For cough / congestion > mucinex dm up to 1200 every 12 hours as needed  Plan A = Automatic = Symbicort 80 1-2 every 12 hours  Work on  maintaining perfect  inhaler technique:  Plan B = Backup Only use your albuterol as a rescue medication CTa  07/19/16 neg PE or explanation for R cp c/w mscp from cough     11/25/2016  f/u ov/Kahne Helfand re: f/u apparent pna LLL in pt with known bronchiectasis/ cystic lesion LLL  Chief Complaint  Patient presents with  . Follow-up    Pt states dxed with PNA x 3 wks ago.  She states she was told a few days ago that her CXR showed a lung mass and told her that she needed a CT Chest. She states having right side pain and minimal SOB.    cough and cp improved from last ov/ no excess/ purulent sputum or mucus plugs then mid April 2018 abrupt onset  nasal stuffiness and more chest congestion more yellowish then green/chills >  seen in [place]  With dx "L sided pna" rx x levaquin x 10 days > green mucus gone /chills gone >  Back to baseline level of chronic mscp R > L worse  Only with cough and back to nl ex tol   rec The key is to stop smoking completely before smoking completely stops you!      02/27/2017  f/u ov/Detrich Rakestraw re:  Bronchiectasis with nl pfts on sybm 80 2bid / still smoking  Chief Complaint  Patient presents with  . Follow-up    Breathing is doing well. No new co's today.   cps all gone and Not limited by breathing from desired activities   No need for saba  rec Stop smoking    06/16/2017  f/u ov/Shann Merrick re:  Bronchiectasis s/p valley fever/ symb 80 2bid  Chief Complaint  Patient presents with  . Follow-up    Breathing is overall doing well. She states she has been having some HA and was dxed with sinus infection a few days ago- taking levaquin.    Ha  frontal and bilateral and worse in am and better on levquin/medrol  rec  Call if not better after you finish your antibiotics The key is to stop smoking completely before smoking completely stops you!    Brain surgery Jan 2019 then RT Feb 2019 finished taking dex around late Feb 2019    11/22/2017 acute extended ov/Del Overfelt re:   Bronchiectasis / symb 1 2bid  Chief Complaint  Patient presents with  . Acute Visit    Having trouble breathing productive cough mucus yellow in color. sharp pain in back.  around early April 2019  nasal congestion / cough slt yellow thick mucus  rx sudaphed seemed some better Then abruptly 2 d prior to [place] chills/ L post cp similar location as prior episodes of bronchiectasis and more sob with activity but not at rest though feels can't take a deep comfortable breath -   Cp better aleve and heating pad. Having to use"rescue" again sev times day and occ noct  (confused between dulera and saba though)  rec Plan A = Automatic = Symbicort or dulera  One twice daily of either one  Work on inhaler technique:  Plan B = Backup Only use your albuterol (ventolin) as a rescue medication Augmentin 875 mg take one pill twice daily  X 10 days -  Please remember to go to the  x-ray department downstairs in the basement  for your tests - we will call you with the results when they are available.    02/28/2018 acute extended ov/Kamilo Och re: cough/ still actively smoking  Chief Complaint  Patient presents with  . Acute Visit    c/o chest congestion, sinus congestion, sob, headaches Xapprox 1 week. Was dx'ed by UC with PNA, not improving on abx.   doing Fine p last ov then abruptly worse  02/22/18 with st, stuffy head and chest with clear mucus, aching all ever, chill temp 100 and increased sob/ need for saba but only once or twice a week Rx with levaquin 02/22/18 but no better and has 2 days left and using saba up to 4 x daily but neb only once, helped some. Doe = MMRC3 = can't walk 100 yards even at a slow pace at a flat grade s stopping due to sob  - severe cough disrupting sleep and wants that yellow cough med that has codeine (? Tussionex/)    Has same somewhat pleuritic discomfort L post chest she always gets with bronchiectasis flares, no better with levaquin  rec Ok to increase the symbicort 160 to  Take 2 puffs first thing in am and then another 2 puffs about 12 hours later.  Work on inhaler technique:  relax and gently blow all the way out then take a nice smooth deep breath back in, triggering the inhaler at same time you start  breathing in.  Hold for up to 5 seconds if you can. Blow out thru nose. Rinse and gargle with water when done Augmentin 875 mg take one pill twice daily  X 10 days - take at breakfast and supper with large glass of water.  It would help reduce the usual side effects (diarrhea and yeast infections) if you ate cultured yogurt at lunch.  Prednisone 10 mg take  4 each am x 2 days,   2 each am x 2 days,  1 each am x 2 days and stop  For cough > tylenol # 3 one every 4 and flutter valve and mucinex 1200 mg every 12 hours as needed  The key is to stop smoking completely before smoking completely stops you!  Please remember to go to the  x-ray department downstairs in the basement  for your tests - we will call you with the results when they are available. - needs alpha one screen on return    03/23/18  augmentin x 7d days called in   03/27/2018  f/u ov/Raysa Bosak re: pulmonary infiltrates s/p completion of RT to L chest 2014 / main on tykerb Chief Complaint  Patient presents with  . Follow-up    Pt had CT Chest done 03/22/18.  She states her breathing has been worse since her last appt here. She has been coughing more over the past 2-3 days- prod with pale yellow sputum. She has been using her albuterol inhaler 2 x daily on average.   Dyspnea:  More sob than usual since  new different L axillary pleuritic cp  acute one day prior to OV   Cough: minimal pale yellow mucus  Sleeping: freq awakening  SABA use: 2 x daily  02: none No more than one Tylenol #3 per daya rec augmentin x 10 days done      04/11/2018  f/u ov/Daniyal Tabor re: f/u infected bleb  Chief Complaint  Patient presents with  . Follow-up    shortness of breath, productive cough, headahce.    Dyspnea:  Outside  walking / ok shopping  Cough: better/ minimal yellow no blood Sleeping: no resp c/o L side down flat bed 2 pillows  SABA use: only if goes out in heat 02: none    Minimal L ax pain with really deep breath or hard cough  "90% improved from last ov"  rec Add:  augmentin x 10 more days done    04/20/18 listed as taking augmentin  Shortness of breath/wheezing: - Continue Symbicort 80 two puffs twice daily  - Use Albuterol rescue inhaler/nebulizer every 4-6 hours for sob/wheezing - DME referral for new nebulizer start   Cough: - Delsym cough syrup twice daily  - Mucinex twice daily   Pain: Take Aleve twice daily for left side pain (with food)         Diflucan rx 04/26/18 per ID    05/02/2018  f/u ov/Luccia Reinheimer re:  "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 Follow-up    Her left side and pain and SOB have been progressively getting worse. She has been using her albuterol inhaler 2 x daily on average and neb once every night.   Dyspnea:  X 100 ft has to stop (see doe, not reproducible in office)  Cough: mostly clear now, does not flare overnight or in am Sleeping: R side down p tylenol #3 does fine/ no fever chills sweats or wt loss SABA use: once or  twice daily  Main concern is pain under L axillary area that prev reported "90%" improved now says that wasn't true and is "no better" but rarely taking tyl #3 as rec as "I have to work"  - aleve no help but takes anyway Rec: FOB DR Ikard > dx ? Aspergillosis > refer ID > see rx   08/07/2018  f/u ov/Levonte Molina re: obst bronchiectasis/ still smoking  Chief Complaint  Patient presents with  . Follow-up    Breathing is overall doing well. She has been coughing at bedtime- prod with pale yellow sputum.  She is using her albuterol inhaler 4 x per wk on average and neb once every night.    Dyspnea:  MMRC2 = can't walk a nl pace on a flat grade s sob but does fine slow and flat  Cough: worse in am's, min yellow  no blood Sleeping: ok p codeine/chlorpheniramine at hs  SABA use: as above  02: none  Cp persists but varies on L and more positional than pleuritic   No obvious day to day or daytime variability or assoc   mucus plugs or hemoptysis or cp or chest tightness, subjective wheeze or overt sinus or hb symptoms.   sleeping without nocturnal   exacerbation  of respiratory  c/o's or need for noct saba. Also denies any obvious fluctuation of symptoms with weather or environmental changes or other aggravating or alleviating factors except as outlined above   No unusual exposure hx or h/o childhood pna/ asthma or knowledge of premature birth.  Current Allergies, Complete Past Medical History, Past Surgical History, Family History, and Social History were reviewed in [employer] record.  ROS  The following are not active complaints unless bolded Hoarseness, sore throat, dysphagia, dental problems, itching, sneezing,  nasal congestion or discharge of excess mucus or purulent secretions, ear ache,   fever, chills, sweats, unintended wt loss or wt gain, classically pleuritic or exertional cp,  orthopnea pnd or arm/hand swelling  or leg swelling, presyncope, palpitations, abdominal pain, anorexia, nausea, vomiting, diarrhea  or change in bowel habits or change in bladder habits, change in stools or change in urine, dysuria, hematuria,  rash, arthralgias, visual complaints, headache, numbness, weakness or ataxia or problems with walking or coordination,  change in mood or  memory.        Current Meds  Medication Sig  . acetaminophen-codeine (TYLENOL #3) 300-30 MG tablet Take 1 tablet by mouth every 4 (four) hours as needed (cough).  Marland Kitchen albuterol (VENTOLIN HFA) 108 (90 BASE) MCG/ACT inhaler Inhale 2 puffs into the lungs every 6 (six) hours as needed.  . budesonide-formoterol (SYMBICORT) 80-4.5 MCG/ACT inhaler Inhale 2 puffs into the lungs 2 (two) times daily.  . Isavuconazonium Sulfate  (CRESEMBA) 186 MG CAPS Take 2 capsules (372 mg total) by mouth 3 (three) times daily. X 2 days. Followed by 2 capsules per day thereafter  . lapatinib (TYKERB) 250 MG tablet Take 4 tablets (1,000 mg total) by mouth daily. Take on an empty stomach, at least 1 hour before or 1 hour after meals.  Marland Kitchen letrozole (FEMARA) 2.5 MG tablet Take 1 tablet (2.5 mg total) by mouth daily.  Marland Kitchen levalbuterol (XOPENEX) 0.63 MG/3ML nebulizer solution Take 3 mLs (0.63 mg total) by nebulization every 4 (four) hours as needed for wheezing or shortness of breath.  . naproxen sodium (ALEVE) 220 MG tablet Take 440 mg by mouth 2 (two) times daily as needed (headache).  Objective:   Physical Exam  amb wf nad with minimally congested sounding cough  Vital signs reviewed - Note on arrival 02 sats  97% on  RA      Wt 118  02/10/11 >  05/03/2011  118 > 117 03/23/2012 >  122 07/30/2012 > 09/29/2014 121> 06/13/2016   119 > 07/15/2016 118 > 08/09/2016  118  > 11/25/2016   114 > 02/27/2017   116 > 06/16/2017  119 >  11/22/2017 112 >  02/28/2018 110 > 03/27/2018   108 > 04/11/2018  110 > 05/02/2018   109 >  08/07/2018  111     HEENT: nl dentition / oropharynx. Nl external ear canals without cough reflex -  Mild bilateral non-specific turbinate edema     NECK :  without JVD/Nodes/TM/ nl carotid upstrokes bilaterally   LUNGS: no acc muscle use,  Mild barrel  contour chest wall with bilateral insp/exp rhonchi and  without cough on insp or exp maneuver and mild  Hyperresonant  to  percussion bilaterally     CV:  RRR  no s3 or murmur or increase in P2, and no edema   ABD:  soft and nontender with pos late insp Hoover's  in the supine position. No bruits or organomegaly appreciated, bowel sounds nl  MS:   Nl gait/  ext warm without deformities, calf tenderness, cyanosis or clubbing No obvious joint restrictions   SKIN: warm and dry without lesions    NEURO:  alert, approp, nl sensorium with  no motor or cerebellar  deficits apparent.                     I personally reviewed images and agree with radiology impression as follows:   Chest CT 07/25/18 Status post left upper lobectomy. Cylindrical bronchiectasis in the peripheral left lower lobe with surrounding wall thickening areas of mucous plugging. Superimposed inflammatory changes are improved, although some peribronchovascular nodularity persists.  Mild centrilobular/peribronchovascular ground-glass nodularity throughout the right lung, new, suggesting mild infection.  Status post left breast lumpectomy with left axillary lymph node dissection. No findings specific for recurrent or metastatic disease.  Assessment:

## 2018-08-08 NOTE — Assessment & Plan Note (Signed)
Counseled re importance of smoking cessation but did not meet time criteria for separate billing     I had an extended discussion with the patient reviewing all relevant studies completed to date and  lasting 15 to 20 minutes of a 25 minute visit    See device teaching which extended face to face time for this visit.  Each maintenance medication was reviewed in detail including emphasizing most importantly the difference between maintenance and prns and under what circumstances the prns are to be triggered using an action plan format that is not reflected in the computer generated alphabetically organized AVS which I have not found useful in most complex patients, especially with respiratory illnesses  Please see AVS for specific instructions unique to this visit that I personally wrote and verbalized to the the pt in detail and then reviewed with pt  by my nurse highlighting any  changes in therapy recommended at today's visit to their plan of care.       

## 2018-08-09 ENCOUNTER — Telehealth: Payer: Self-pay

## 2018-08-09 NOTE — Telephone Encounter (Signed)
-----   Message from Landis Gandy, RN sent at 08/09/2018  9:22 AM EST -----  ----- Message ----- From: Carlyle Basques, MD Sent: 08/08/2018  11:24 PM EST To: Landis Gandy, RN  Can you write a letter to her employer to have her work 1/2 time for the next two weeks in setting of the infection we are treating

## 2018-08-09 NOTE — Telephone Encounter (Signed)
Called Melissa [name] to notify her of letter being ready to be picked up or to be faxed.  Melissa [name] requested start date to be from 08/13/2018- 08/27/2018.  Confirmed understanding of details written.   Faxed on 08/09/2018  [name], [place]

## 2018-08-09 NOTE — Telephone Encounter (Signed)
Oral Oncology Patient Advocate Encounter  Prior Authorization for Tykerb has been approved.    PA# [id number] Effective dates: 08/09/18 through 08/09/19  Oral Oncology Clinic will continue to follow.   [place] Patient [place] Phone [phone] Fax [phone]

## 2018-08-09 NOTE — Telephone Encounter (Signed)
Oral Oncology Patient Advocate Encounter  Received notification from [facility] that prior authorization for Tykerb is required.  PA submitted on CoverMyMeds Key [id number] Status is pending  Oral Oncology Clinic will continue to follow.  [place] Patient [place] Phone [phone] Fax [phone]

## 2018-08-13 ENCOUNTER — Telehealth: Payer: Self-pay

## 2018-08-13 MED FILL — TYKERB 250 MG TABLET: 250 | 30 days supply | Qty: 120 | Fill #0

## 2018-08-13 NOTE — Telephone Encounter (Signed)
Called patient, upon completion of form to excuse from work.   Dennie agreed to pick up form tomorrow morning 08/14/2018.

## 2018-08-15 NOTE — Telephone Encounter (Signed)
Melissa Kent HR Manager for [employer] called to get clarification on patient "Return to Work Auth Form" dated 08/13/2018. After speaking with Dr Baxter Flattery advised her the provider stated that the patient should remain out of work for 2 weeks due to "fatigue and shortness of breath associated with treatment for infection." She stated that she was confused as a previous note stated she could return to work "with limited activity". Asked Baxter Flattery again she restated the patient is out of work for 2 weeks (14 days).

## 2018-08-21 ENCOUNTER — Other Ambulatory Visit: Payer: Self-pay | Admitting: Radiation Therapy

## 2018-08-23 ENCOUNTER — Other Ambulatory Visit: Payer: 59

## 2018-08-23 ENCOUNTER — Ambulatory Visit
Admission: RE | Admit: 2018-08-23 | Discharge: 2018-08-23 | Disposition: A | Discharge status: Home / Self Care | Payer: 59 | Source: Ambulatory Visit | Attending: Radiation Oncology | Admitting: Radiation Oncology

## 2018-08-23 DIAGNOSIS — C7949 Secondary malignant neoplasm of other parts of nervous system: Principal | ICD-10-CM

## 2018-08-23 DIAGNOSIS — C7931 Secondary malignant neoplasm of brain: Secondary | ICD-10-CM

## 2018-08-23 MED ORDER — GADOBENATE DIMEGLUMINE 529 MG/ML IV SOLN
10.0000 mL | Freq: Once | INTRAVENOUS | Status: AC | PRN
  Administered 2018-08-23: 10 mL via INTRAVENOUS

## 2018-08-24 ENCOUNTER — Ambulatory Visit: Payer: 59 | Admitting: Internal Medicine

## 2018-08-24 ENCOUNTER — Ambulatory Visit
Admission: RE | Admit: 2018-08-24 | Discharge: 2018-08-24 | Disposition: A | Discharge status: Home / Self Care | Payer: 59 | Source: Ambulatory Visit | Attending: Internal Medicine | Admitting: Internal Medicine

## 2018-08-24 ENCOUNTER — Telehealth: Payer: Self-pay | Admitting: Urology

## 2018-08-24 ENCOUNTER — Encounter: Payer: Self-pay | Admitting: Internal Medicine

## 2018-08-24 VITALS — BP 102/69 | HR 71 | Temp 98.0°F | Ht 62.0 in | Wt 109.0 lb

## 2018-08-24 DIAGNOSIS — J45909 Unspecified asthma, uncomplicated: Secondary | ICD-10-CM | POA: Diagnosis not present

## 2018-08-24 DIAGNOSIS — R0609 Other forms of dyspnea: Principal | ICD-10-CM

## 2018-08-24 DIAGNOSIS — Z23 Encounter for immunization: Secondary | ICD-10-CM

## 2018-08-24 DIAGNOSIS — Z79899 Other long term (current) drug therapy: Secondary | ICD-10-CM

## 2018-08-24 DIAGNOSIS — B441 Other pulmonary aspergillosis: Secondary | ICD-10-CM | POA: Diagnosis not present

## 2018-08-24 DIAGNOSIS — Z8709 Personal history of other diseases of the respiratory system: Secondary | ICD-10-CM

## 2018-08-24 DIAGNOSIS — Z853 Personal history of malignant neoplasm of breast: Secondary | ICD-10-CM | POA: Diagnosis not present

## 2018-08-24 MED ORDER — PREDNISONE 10 MG (21) PO TBPK: ORAL_TABLET | ORAL | 0 refills | Status: DC

## 2018-08-24 NOTE — Progress Notes (Signed)
Hep A vaccine entered in error. Order deleted. [name], LPN

## 2018-08-24 NOTE — Telephone Encounter (Signed)
I called and spoke with Melissa [name] by phone to inform her of her most recent MRI brain results from her scan on 08/23/2018 which shows a new 11 mm inferior right frontal lesion.  There is only mild associated edema without mass-effect.   She reports that she has been having more frequent frontal headaches but reports that they are not severe and do respond to Aleve.  Otherwise, she has been feeling well.  We discussed the recommendation to proceed with a single fraction of SRS to this new brain metastasis and she is in agreement.  She will have a CT Sim for treatment planning on Tuesday, 08/28/2018 at 3 PM in anticipation of her treatment with Dr. Vertell Limber and Dr. Tammi Klippel on Tuesday, 09/04/2018 at 1 PM.  She appears to have a good understanding of our recommendations and is in agreement with the stated plan.  She knows to call with any questions or concerns in the interim.  Nicholos Johns, MMS, PA-C [place] at [place]: [phone]  Fax: [phone]

## 2018-08-24 NOTE — Progress Notes (Signed)
Melissa [name], please set-up SRS 

## 2018-08-28 ENCOUNTER — Ambulatory Visit
Admission: RE | Admit: 2018-08-28 | Discharge: 2018-08-28 | Disposition: A | Discharge status: Home / Self Care | Payer: No Typology Code available for payment source | Source: Ambulatory Visit | Attending: Radiation Oncology | Admitting: Radiation Oncology

## 2018-08-28 ENCOUNTER — Ambulatory Visit: Payer: No Typology Code available for payment source

## 2018-08-28 ENCOUNTER — Ambulatory Visit: Payer: Self-pay | Admitting: Urology

## 2018-08-28 DIAGNOSIS — C7931 Secondary malignant neoplasm of brain: Secondary | ICD-10-CM | POA: Insufficient documentation

## 2018-08-28 DIAGNOSIS — Z510 Encounter for antineoplastic radiation therapy: Secondary | ICD-10-CM | POA: Diagnosis present

## 2018-08-28 DIAGNOSIS — C50912 Malignant neoplasm of unspecified site of left female breast: Secondary | ICD-10-CM | POA: Insufficient documentation

## 2018-08-28 NOTE — Progress Notes (Signed)
  Radiation Oncology         [phone] ________________________________  Name: Melissa [name] MRN: [id number]  Date: 08/28/2018  DOB: 10-Aug-1965  SIMULATION AND TREATMENT PLANNING NOTE    ICD-10-CM   1. 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  Identity was confirmed.  All relevant records and images related to the planned course of therapy were reviewed.  The patient freely provided informed written consent to proceed with treatment after reviewing the details related to the planned course of therapy. The consent form was witnessed and verified by the simulation staff. Intravenous access was established for contrast administration. Then, the patient was set-up in a stable reproducible supine position for radiation therapy.  A relocatable thermoplastic stereotactic head frame was fabricated for precise immobilization.  CT images were obtained.  Surface markings were placed.  The CT images were loaded into the planning software and fused with the patient's targeting MRI scan.  Then the target and avoidance structures were contoured.  Treatment planning then occurred.  The radiation prescription was entered and confirmed.  I have requested 3D planning  I have requested a DVH of the following structures: Brain stem, brain, left eye, right eye, lenses, optic chiasm, target volumes, uninvolved brain, and normal tissue.    SPECIAL TREATMENT PROCEDURE:  The planned course of therapy using radiation constitutes a special treatment procedure. Special care is required in the management of this patient for the following reasons. This treatment constitutes a Special Treatment Procedure for the following reason: High dose per fraction requiring special monitoring for increased toxicities of treatment including  daily imaging.  The special nature of the planned course of radiotherapy will require increased physician supervision and oversight to ensure patient's safety with optimal treatment outcomes.  PLAN:  The patient will receive 20 Gy in 1 fraction.  ________________________________  Sheral Apley Tammi Klippel, M.D.   This document serves as a record of services personally performed by Tyler Pita, MD. It was created on his behalf by Wilburn Mylar, a trained medical scribe. The creation of this record is based on the scribe's personal observations and the provider's statements to them. This document has been checked and approved by the attending provider.

## 2018-08-31 DIAGNOSIS — Z510 Encounter for antineoplastic radiation therapy: Secondary | ICD-10-CM | POA: Diagnosis not present

## 2018-09-01 NOTE — Progress Notes (Signed)
Patient ID: Melissa [name], female   DOB: [birth year], 53 y.o.   MRN: [id number]  HPI 53yo F with history of breast cancer, hx of pulmonary cocciodomycosis, also being treated for pulmonary aspergillosis, tolerating cresemba(isavuconazole). In the past few weeks, she has reported being fatigued and most recently noticing some wheezing and increased shortness of breath, no significant change in sputum.no fever or chills.  Outpatient Encounter Medications as of 08/24/2018  Medication Sig  . acetaminophen-codeine (TYLENOL #3) 300-30 MG tablet Take 1 tablet by mouth every 4 (four) hours as needed (cough).  Marland Kitchen albuterol (VENTOLIN HFA) 108 (90 BASE) MCG/ACT inhaler Inhale 2 puffs into the lungs every 6 (six) hours as needed.  . budesonide-formoterol (SYMBICORT) 80-4.5 MCG/ACT inhaler Inhale 2 puffs into the lungs 2 (two) times daily.  . Chlorpheniramine-Codeine 2-10 MG/5ML LIQD Take 5 mLs by mouth every 6 (six) hours as needed.  . Isavuconazonium Sulfate (CRESEMBA) 186 MG CAPS Take 2 capsules (372 mg total) by mouth 3 (three) times daily. X 2 days. Followed by 2 capsules per day thereafter  . lapatinib (TYKERB) 250 MG tablet Take 4 tablets (1,000 mg total) by mouth daily. Take on an empty stomach, at least 1 hour before or 1 hour after meals.  Marland Kitchen letrozole (FEMARA) 2.5 MG tablet Take 1 tablet (2.5 mg total) by mouth daily.  Marland Kitchen levalbuterol (XOPENEX) 0.63 MG/3ML nebulizer solution Take 3 mLs (0.63 mg total) by nebulization every 4 (four) hours as needed for wheezing or shortness of breath.  . naproxen sodium (ALEVE) 220 MG tablet Take 440 mg by mouth 2 (two) times daily as needed (headache).   . predniSONE (STERAPRED UNI-PAK 21 TAB) 10 MG (21) TBPK tablet Take 4 tab x 2 day, then 3 tab x 2d, then 2 tab x 2d, then 1 tab daily   No facility-administered encounter medications on file as of 08/24/2018.      Patient Active Problem List   Diagnosis Date Noted  . Mediastinal adenopathy   . H/O  coccidioidomycosis 05/16/2018  . DOE (dyspnea on exertion) 05/03/2018  . Diarrhea 04/22/2018  . Cavitary lesion of lung in area of previous cyts in apex of Sup Segment of LLL 03/29/2018  . Aortic atherosclerosis ([place]) 01/22/2018  . Emphysema lung ([place]) 01/22/2018  . CAP (community acquired pneumonia) 11/23/2017  . Metastasis to brain ([place]) 08/04/2017  . Brain metastasis ([place]) 07/27/2017  . Nicotine abuse 07/27/2017  . Migraines 07/27/2017  . HCAP (healthcare-associated pneumonia) 11/26/2016  . Upper airway cough syndrome 08/10/2016  . Abnormal echocardiogram 08/07/2013  . Chest tightness or pressure 08/07/2013  . Hx of radiation therapy   . Edema of left lower extremity 11/19/2012  . Tachycardia 09/20/2012  . Breast cancer of upper-inner quadrant of left female breast ([place]) 06/08/2012  . Obstructive bronchiectasis ([place]) 01/26/2011  . Cigarette smoker 01/26/2011     Health Maintenance Due  Topic Date Due  . TETANUS/TDAP  01/18/1987  . PAP SMEAR-Modifier  11/20/2016  . COLONOSCOPY  01/17/2018     Review of Systems Per hpi, 12 point ros is otherwise negative Physical Exam   BP 102/69   Pulse 71   Temp 98 F (36.7 C)   Ht 5\' 2"  (1.575 m)   Wt 109 lb (49.4 kg)   LMP 07/25/2012 Comment: pregnancy waiver form signed 01-18-2018  BMI 19.94 kg/m   Physical Exam  Constitutional:  oriented to person, place, and time. appears well-developed and well-nourished. No distress.  HENT: [place]/AT, PERRLA, no scleral icterus  Mouth/Throat: Oropharynx is clear and moist. No oropharyngeal exudate.  Cardiovascular: Normal rate, regular rhythm and normal heart sounds. Exam reveals no gallop and no friction rub.  No murmur heard.  Pulmonary/Chest: Effort normal and breath sounds normal. Mild wheeze Neck = supple, no nuchal rigidity Lymphadenopathy: no cervical adenopathy. No axillary adenopathy Neurological: alert and oriented to person, place, and time.  Skin: Skin is warm and dry. No rash  noted. No erythema.  Psychiatric: a normal mood and affect.  behavior is normal.   CBC Lab Results  Component Value Date   WBC 12.9 (H) 07/27/2018   RBC 3.77 (L) 07/27/2018   HGB 10.6 (L) 07/27/2018   HCT 32.9 (L) 07/27/2018   PLT 431 (H) 07/27/2018   MCV 87.3 07/27/2018   MCH 28.1 07/27/2018   MCHC 32.2 07/27/2018   RDW 18.0 (H) 07/27/2018   LYMPHSABS 2.6 07/27/2018   MONOABS 0.6 07/27/2018   EOSABS 0.5 07/27/2018    BMET Lab Results  Component Value Date   NA 140 07/27/2018   K 3.6 07/27/2018   CL 103 07/27/2018   CO2 27 07/27/2018   GLUCOSE 120 (H) 07/27/2018   BUN 13 07/27/2018   CREATININE 0.92 07/27/2018   CALCIUM 9.8 07/27/2018   GFRNONAA >60 07/27/2018   GFRAA >60 07/27/2018    cxr - no new lesions, has ongoing chronic changes in left lower lung, volume loss in upper lung field  Assessment and Plan   reactive airway disease = possibly from viral infection. Will recommend supportive care. Give short steroid taper. Will contact next week to see if any improvement.  Pulmonary aspergillosis = continue on isavuconazole

## 2018-09-03 MED FILL — CRESEMBA 186 MG CAPSULE: 186 | 28 days supply | Qty: 56 | Fill #2

## 2018-09-04 ENCOUNTER — Ambulatory Visit
Admission: RE | Admit: 2018-09-04 | Discharge: 2018-09-04 | Disposition: A | Discharge status: Home / Self Care | Payer: No Typology Code available for payment source | Source: Ambulatory Visit | Attending: Radiation Oncology | Admitting: Radiation Oncology

## 2018-09-04 DIAGNOSIS — C7931 Secondary malignant neoplasm of brain: Secondary | ICD-10-CM

## 2018-09-04 DIAGNOSIS — Z510 Encounter for antineoplastic radiation therapy: Secondary | ICD-10-CM | POA: Diagnosis not present

## 2018-09-04 NOTE — Op Note (Signed)
  Name: HUDSON LEHMKUHL  MRN: [id number]  Date: 09/04/2018   DOB: 1965-11-04  Stereotactic Radiosurgery Operative Note  PRE-OPERATIVE DIAGNOSIS:  Multiple Brain Metastases  POST-OPERATIVE DIAGNOSIS:  Multiple Brain Metastases  PROCEDURE:  Stereotactic Radiosurgery  SURGEON:  Peggyann Shoals, MD  NARRATIVE: The patient underwent a radiation treatment planning session in the radiation oncology simulation suite under the care of the radiation oncology physician and physicist.  I participated closely in the radiation treatment planning afterwards. The patient underwent planning CT which was fused to 3T high resolution MRI with 1 mm axial slices.  These images were fused on the planning system.  We contoured the gross target volumes and subsequently expanded this to yield the Planning Target Volume. I actively participated in the planning process.  I helped to define and review the target contours and also the contours of the optic pathway, eyes, brainstem and selected nearby organs at risk.  All the dose constraints for critical structures were reviewed and compared to AAPM Task Group 101.  The prescription dose conformity was reviewed.  I approved the plan electronically.    Accordingly, [name] was brought to the TrueBeam stereotactic radiation treatment linac and placed in the custom immobilization mask.  The patient was aligned according to the IR fiducial markers with BrainLab Exactrac, then orthogonal x-rays were used in ExacTrac with the 6DOF robotic table and the shifts were made to align the patient  [name] received stereotactic radiosurgery uneventfully.    Lesions treated:  2   Complex lesions treated:  0 (>3.5 cm, <74mm of optic path, or within the brainstem)   The detailed description of the procedure is recorded in the radiation oncology procedure note.  I was present for the duration of the procedure.  DISPOSITION:  Following delivery, the patient was transported to nursing  in stable condition and monitored for possible acute effects to be discharged to home in stable condition with follow-up in one month.  Peggyann Shoals, MD 09/04/2018 1:53 PM

## 2018-09-06 NOTE — Progress Notes (Signed)
  Radiation Oncology         ([phone] ________________________________  Stereotactic Treatment Procedure Note  Name: Melissa [name] MRN: [id number]  Date: 09/04/2018  DOB: April 09, 1966  SPECIAL TREATMENT PROCEDURE    ICD-10-CM   1. Brain metastasis ([place]) C79.31     3D TREATMENT PLANNING AND DOSIMETRY:  The patient's radiation plan was reviewed and approved by neurosurgery and radiation oncology prior to treatment.  It showed 3-dimensional radiation distributions overlaid onto the planning CT/MRI image set.  The [facility] for the target structures as well as the organs at risk were reviewed. The documentation of the 3D plan and dosimetry are filed in the radiation oncology EMR.  NARRATIVE:  Melissa [name] was brought to the TrueBeam stereotactic radiation treatment machine and placed supine on the CT couch. The head frame was applied, and the patient was set up for stereotactic radiosurgery.  Neurosurgery was present for the set-up and delivery  SIMULATION VERIFICATION:  In the couch zero-angle position, the patient underwent Exactrac imaging using the Brainlab system with orthogonal KV images.  These were carefully aligned and repeated to confirm treatment position for each of the isocenters.  The Exactrac snap film verification was repeated at each couch angle.  PROCEDURE: Craig Guess Burr received stereotactic radiosurgery to the following targets: Right frontal 10 mm and 5 mm targets were treated using 5 Rapid Arc VMAT Beams to a prescription dose of 20 Gy.  ExacTrac registration was performed for each couch angle.  The 100% isodose line was prescribed.  6 MV X-rays were delivered in the flattening filter free beam mode.  STEREOTACTIC TREATMENT MANAGEMENT:  Following delivery, the patient was transported to nursing in stable condition and monitored for possible acute effects.  Vital signs were recorded LMP 07/25/2012 Comment: pregnancy waiver form signed 01-18-2018. The patient tolerated treatment  without significant acute effects, and was discharged to home in stable condition.    PLAN: Follow-up in one month.  ________________________________  Sheral Apley. Tammi Klippel, M.D.

## 2018-09-07 ENCOUNTER — Encounter: Payer: Self-pay | Admitting: Radiation Oncology

## 2018-09-07 NOTE — Progress Notes (Addendum)
  Radiation Oncology         [phone] ________________________________  Name: Melissa [name] MRN: [id number]  Date: 09/07/2018  DOB: Mar 19, 1966  End of Treatment Note  Diagnosis:   53 y.o. female with progressive brain metastasis, ER PR positive, HER-2 positive, from invasive ductal carcinoma of the left breast.     Indication for treatment:  Palliative       Radiation treatment dates:   09/04/2018  Site/dose:   Brain, Right Frontal, 2 targets / 20 Gy in 1 fraction PTV3: Ant Rt Frontal 96m 20Gy PTV4: Rt Frontal resection cavity 560m 20Gy  Beams/energy:   ExacTrac, 4 vmat beams / 6 FFF  Narrative: The patient tolerated radiation treatment relatively well without significant acute effects and was discharged home in stable condition.  Plan: The patient has completed radiation treatment. She will return to radiation oncology clinic for routine followup in one month. I advised her to call or return sooner if she has any questions or concerns related to her recovery or treatment. ________________________________  MaSheral ApleyMaTammi KlippelM.D.  This document serves as a record of services personally performed by MaTyler PitaMD. It was created on his behalf by KaWilburn Mylara trained medical scribe. The creation of this record is based on the scribe's personal observations and the provider's statements to them. This document has been checked and approved by the attending provider.

## 2018-09-11 ENCOUNTER — Telehealth: Payer: Self-pay

## 2018-09-11 NOTE — Telephone Encounter (Signed)
Oral Oncology Patient Advocate Encounter  I received notification from [place] that the patient no longer has insurance and needs assistance getting her medicine. I called the patient and she verified that she does not have insurance right now. I explained that I could help her apply for manufacturer assistance through [employer]. If approved, [employer] would have the medicine shipped directly to her home free of charge. She agreed to proceed with the application process. I filled out the application online, this does not require the patient to sign the actual form.  I faxed the completed application today 02/10/63.  This encounter will be updated until final determination.  [facility] Patient [place] Phone [phone] Fax [phone] 09/11/2018   9:32 AM

## 2018-09-13 ENCOUNTER — Ambulatory Visit: Payer: 59 | Admitting: Internal Medicine

## 2018-09-17 NOTE — Telephone Encounter (Signed)
Oral Oncology Patient Advocate Encounter  The patient called today and said that she sent in her paperwork for COBRA and asked me to see if it was active yet. I did not find active coverage for the patient, I told her I would call [employer] and follow up.  I called [employer] to follow up on the [employer]ker[employer] manufacturer assistance application and it is now in benefit investigation.  I called the patient and gave her this information, she verbalized understanding and great appreciation.  This encounter will be updated until final determination.  [place] [facility] [place] Phone [phone] Fax [phone] 09/17/2018   1:18 PM

## 2018-09-20 NOTE — Telephone Encounter (Signed)
Oral Oncology Patient Advocate Encounter  I received notification that the patient has been approved with [employer]. However, I checked the patients COBRA status today and it is active. [employer] copay is $0 at [facility]. I will cancel this [employer] approval.  I called the patient and left a detailed message.  [place] Patient [place] Phone [phone] Fax [phone] 09/20/2018   11:29 AM

## 2018-09-24 MED FILL — TYKERB 250 MG TABLET: 250 | 30 days supply | Qty: 120 | Fill #1

## 2018-10-04 ENCOUNTER — Encounter: Payer: Self-pay | Admitting: Family

## 2018-10-04 ENCOUNTER — Other Ambulatory Visit: Payer: Self-pay

## 2018-10-04 ENCOUNTER — Ambulatory Visit (INDEPENDENT_AMBULATORY_CARE_PROVIDER_SITE_OTHER): Payer: 59 | Admitting: Family

## 2018-10-04 VITALS — BP 98/66 | HR 82 | Temp 98.5°F | Wt 112.0 lb

## 2018-10-04 DIAGNOSIS — J984 Other disorders of lung: Secondary | ICD-10-CM | POA: Diagnosis not present

## 2018-10-04 NOTE — Progress Notes (Signed)
Subjective:    Patient ID: Melissa [name], female    DOB: [birth year], 53 y.o.   MRN: [id number]  Chief Complaint  Patient presents with  . Pulmonary Aspergillis    HPI:  Melissa [name] is a 53 y.o. female who presents today for folllow up office visit pulmonary aspergillus.  Melissa [name] was last seen in the office on 08/24/18 for follow up with follow up CT staging by pulmonary showing improvement since initiation of her isavuconazole. She was continued on the medication at that time.   Melissa [name] has been taking her Cresemba as prescribed with no adverse side effects or missed doses. She does continue to have a cough that is occasionally productive with pale/yellow sputum at times. Course of the symptoms has improved since her last office visit. She does also have some shortness of breath which she says is baseline for her and exacerbated with increasing activity. She does continue to use her Symbicort daily and has had to use her rescue inhaler less. Denies any fevers, chills, or sweats.   Melissa [name] has been having some left sided pain starting about 4 days ago that she is unsure if it is musculoskeletal or pleural related. She did work in her garden was increasing her activity over the last several days. Course of the pain has stayed about the same. Severity is mild.    Allergies  Allergen Reactions  . Aspirin Anaphylaxis and Shortness Of Breath    THROAT CLOSES  . Protonix [Pantoprazole] Nausea Only and Other (See Comments)    Also caused a "film in the mouth" and caused chest pressure  . Iodinated Diagnostic Agents Rash      Outpatient Medications Prior to Visit  Medication Sig Dispense Refill  . acetaminophen-codeine (TYLENOL #3) 300-30 MG tablet Take 1 tablet by mouth every 4 (four) hours as needed (cough). 30 tablet 0  . albuterol (VENTOLIN HFA) 108 (90 BASE) MCG/ACT inhaler Inhale 2 puffs into the lungs every 6 (six) hours as needed. 1 Inhaler 3  . budesonide-formoterol  (SYMBICORT) 80-4.5 MCG/ACT inhaler Inhale 2 puffs into the lungs 2 (two) times daily. 1 Inhaler 5  . Chlorpheniramine-Codeine 2-10 MG/5ML LIQD Take 5 mLs by mouth every 6 (six) hours as needed. 200 mL 0  . Isavuconazonium Sulfate (CRESEMBA) 186 MG CAPS Take 2 capsules (372 mg total) by mouth 3 (three) times daily. X 2 days. Followed by 2 capsules per day thereafter 60 capsule 6  . lapatinib (TYKERB) 250 MG tablet Take 4 tablets (1,000 mg total) by mouth daily. Take on an empty stomach, at least 1 hour before or 1 hour after meals. 120 tablet 3  . letrozole (FEMARA) 2.5 MG tablet Take 1 tablet (2.5 mg total) by mouth daily. 30 tablet 11  . levalbuterol (XOPENEX) 0.63 MG/3ML nebulizer solution Take 3 mLs (0.63 mg total) by nebulization every 4 (four) hours as needed for wheezing or shortness of breath. 540 mL 6  . naproxen sodium (ALEVE) 220 MG tablet Take 440 mg by mouth 2 (two) times daily as needed (headache).     . predniSONE (STERAPRED UNI-PAK 21 TAB) 10 MG (21) TBPK tablet Take 4 tab x 2 day, then 3 tab x 2d, then 2 tab x 2d, then 1 tab daily 21 tablet 0   No facility-administered medications prior to visit.      Past Medical History:  Diagnosis Date  . Anemia   . Arthritis    knees and hips  . Asthma   .  Breast cancer ([place])   . Bronchiectasis ([place])   . Bronchiolitis   . Cancer [facility])    breast cancer 2014  . Complication of anesthesia    bp dropped + desat   . COPD (chronic obstructive pulmonary disease) ([place])   . Dyspnea    DOE  . GERD (gastroesophageal reflux disease)   . H/O coccidioidomycosis    was reason for lung lobectomy  . Headache(784.0)    due to eye strain or not eating  . History of anemia    no current problem  . History of asthma    as a child  . History of breast cancer 2014   left  . History of chemotherapy    finished 07/17/2013  . History of hiatal hernia    AGE 34  . Hx of radiation therapy 03/25/13-05/06/13   left breast 5000 cGy/25 sessions, left  breast boost 1000 cGy/5 sessions  . Pneumonia    LAST FLARE UP 01/2018  . Runny nose 07/30/2013   clear drainage  . Wears dentures    upper     Past Surgical History:  Procedure Laterality Date  . APPLICATION OF CRANIAL NAVIGATION N/A 08/04/2017   Procedure: APPLICATION OF CRANIAL NAVIGATION;  Surgeon: [name], [name], MD;  Location: [place];  Service: Neurosurgery;  Laterality: N/A;  . AXILLARY LYMPH NODE DISSECTION Left 02/08/2013   Procedure: LEFT AXILLARY DISSECTION;  Surgeon: Edward Jolly, MD;  Location: [place];  Service: General;  Laterality: Left;  . BREAST CYST EXCISION Right 2006  . BREAST LUMPECTOMY Left 2014  . BREAST LUMPECTOMY WITH NEEDLE LOCALIZATION AND AXILLARY SENTINEL LYMPH NODE BX Left 12/31/2012   Procedure: NEEDLE LOCALIZATION LEFT BREAST LUMPECTOMY AND LEFT AXILLARY SENTENIAL LYMPH NODE BX;  Surgeon: Edward Jolly, MD;  Location: [place];  Service: General;  Laterality: Left;  . CESAREAN SECTION  1995/1996  . CRANIOTOMY Right 08/04/2017   Procedure: Right Frontal craniotomy for resection of tumor with stereotactic navigation;  Surgeon: [name], [name], MD;  Location: [place];  Service: Neurosurgery;  Laterality: Right;  Right Frontal craniotomy for resection of tumor with stereotactic navigation  . LUNG LOBECTOMY Left 05/1996   upper lobe - due to [facility] & Heart Fever  . PORT-A-CATH REMOVAL Right 08/02/2013   Procedure: REMOVAL PORT-A-CATH;  Surgeon: Edward Jolly, MD;  Location: [place];  Service: General;  Laterality: Right;  . PORTACATH PLACEMENT  07/02/2012   Procedure: INSERTION PORT-A-CATH;  Surgeon: Edward Jolly, MD;  Location: [place];  Service: General;  Laterality: N/A;  right  . VIDEO BRONCHOSCOPY WITH ENDOBRONCHIAL ULTRASOUND N/A 05/23/2018   Procedure: VIDEO BRONCHOSCOPY WITH ENDOBRONCHIAL ULTRASOUND;  Surgeon: Garner Nash, DO;  Location[facility];  Service:  Thoracic;  Laterality: N/A;     Review of Systems  Constitutional: Negative for appetite change, chills, fatigue, fever and unexpected weight change.  Respiratory: Positive for cough, shortness of breath and wheezing. Negative for chest tightness.   Cardiovascular: Positive for chest pain. Negative for palpitations.  Gastrointestinal: Positive for abdominal pain. Negative for diarrhea, nausea and vomiting.  Skin: Negative for rash.  Neurological: Negative for weakness and headaches.      Objective:    BP 98/66   Pulse 82   Temp 98.5 F (36.9 C) (Oral)   Wt 112 lb (50.8 kg)   LMP 07/25/2012 Comment: pregnancy waiver form signed 01-18-2018  BMI 20.49 kg/m  Nursing note and vital signs reviewed.  Physical Exam Constitutional:      General: She is not in acute distress.    Appearance: She is well-developed.     Comments: Seated in the chair; pleasant.   Cardiovascular:     Rate and Rhythm: Normal rate and regular rhythm.     Heart sounds: Normal heart sounds. No murmur.  Pulmonary:     Effort: Pulmonary effort is normal.     Breath sounds: No stridor. Wheezing and rales present. No rhonchi.  Skin:    General: Skin is warm and dry.  Neurological:     Mental Status: She is alert.  Psychiatric:        Mood and Affect: Mood normal.        Behavior: Behavior normal.        Assessment & Plan:   Problem List Items Addressed This Visit      Other   Cavitary lesion of lung in area of previous cyts in apex of Sup Segment of LLL - Primary    Ms. Whitfill continues to be treated for cavitary lesions of the left lung with cultures positive for aspergillis. She has good adherence and tolerance of her Cresemba. Overall her symptoms are improved since previous. Will check CMET today. Plan to continue current dose of Cresemba. She will follow up with pharmacy staff next month and Dr. Baxter Flattery in 2 months.       Relevant Orders   COMPLETE METABOLIC PANEL WITH GFR       I am having  Melissa [name]. Schmieg maintain her albuterol, naproxen sodium, budesonide-formoterol, levalbuterol, acetaminophen-codeine, Isavuconazonium Sulfate, letrozole, lapatinib, Chlorpheniramine-Codeine, and predniSONE.   Follow-up: Return in about 2 months (around 12/04/2018), or if symptoms worsen or fail to improve.   Terri Piedra, MSN, FNP-C Nurse Practitioner [facility] [place] number: [phone]

## 2018-10-04 NOTE — Patient Instructions (Signed)
Nice to meet you.  Please continue to take you Crescemba as prescribed.  We will check your liver function today.  Plan for a follow up office visit in 2 months with Dr. Baxter Flattery and 1 month with Cassie Ayrshire, [place].D.   Please let us know if you have any questions or your symptoms worsen.   Have a great day and wear your mask!

## 2018-10-04 NOTE — Assessment & Plan Note (Signed)
Melissa [name] continues to be treated for cavitary lesions of the left lung with cultures positive for aspergillis. She has good adherence and tolerance of her Cresemba. Overall her symptoms are improved since previous. Will check CMET today. Plan to continue current dose of Cresemba. She will follow up with pharmacy staff next month and Dr. Baxter Flattery in 2 months.

## 2018-10-05 LAB — COMPLETE METABOLIC PANEL WITH GFR
AG Ratio: 1.4 (calc) (ref 1.0–2.5)
ALBUMIN MSPROF: 4.2 g/dL (ref 3.6–5.1)
ALT: 13 U/L (ref 6–29)
AST: 18 U/L (ref 10–35)
Alkaline phosphatase (APISO): 100 U/L (ref 37–153)
BILIRUBIN TOTAL: 0.2 mg/dL (ref 0.2–1.2)
BUN: 18 mg/dL (ref 7–25)
CALCIUM: 10 mg/dL (ref 8.6–10.4)
CHLORIDE: 104 mmol/L (ref 98–110)
CO2: 26 mmol/L (ref 20–32)
Creat: 0.91 mg/dL (ref 0.50–1.05)
GFR, EST AFRICAN AMERICAN: 85 mL/min/{1.73_m2} (ref >=60)
GFR, EST NON AFRICAN AMERICAN: 74 mL/min/{1.73_m2} (ref >=60)
GLUCOSE: 83 mg/dL (ref 65–99)
Globulin: 3.1 g/dL (calc) (ref 1.9–3.7)
Potassium: 5.2 mmol/L (ref 3.5–5.3)
Sodium: 141 mmol/L (ref 135–146)
TOTAL PROTEIN: 7.3 g/dL (ref 6.1–8.1)

## 2018-10-08 ENCOUNTER — Ambulatory Visit: Payer: Self-pay | Admitting: Urology

## 2018-10-09 ENCOUNTER — Telehealth: Payer: Self-pay | Admitting: Urology

## 2018-10-09 NOTE — Telephone Encounter (Signed)
I spoke with the patient by phone to conduct her routine scheduled one-month follow-up visit.  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  She reports that she is doing very well overall and has not experienced any negative side effects.  She specifically denies fatigue, headaches, nausea, vomiting, dizziness, imbalance, tremor or seizure activity.  She reports a healthy appetite and is maintaining her weight.  She has remained quite active and is in excellent spirits.  She continues on anti-HER-2 therapy with Lapatinib '1000mg'$  daily with Letrazole 2.'5mg'$  daily which she continues to tolerate well.  Her next scheduled follow-up visit with Dr. Lindi Adie is Nov 26, 2018.  We discussed the recommendation to repeat an MRI brain in 2 months to assess her treatment response and continue monitoring for any disease progression.  Her case and imaging will be reviewed at an upcoming multidisciplinary brain conference and I will either call her with the results and recommendations or, in the event that we have a better grasp on the COVID-19 situation at that point, we will plan to see her back in the office for follow-up.  She has a good understanding of these recommendations and is comfortable and in agreement with the stated plan.  She knows to call at anytime in the interim with any questions or concerns.  Nicholos Johns, MMS, PA-C [place] at [place]: [phone]  Fax: [phone]

## 2018-10-09 NOTE — Telephone Encounter (Signed)
I left a message on the patient's voicemail requesting that she return my call to complete her 1 month follow-up visit over the telephone due to the current COVID-19 concerns with limiting patient exposure when possible.  I advised that I just wanted to touch base with her to see if she is having any concerning symptoms and to ensure that we are on track for setting up her next surveillance brain MRI.  Nicholos Johns, MMS, PA-C [place] [facility] [place]: [phone]  Fax: [phone]

## 2018-10-10 ENCOUNTER — Other Ambulatory Visit: Payer: Self-pay | Admitting: Radiation Therapy

## 2018-10-10 DIAGNOSIS — C7931 Secondary malignant neoplasm of brain: Secondary | ICD-10-CM

## 2018-10-10 DIAGNOSIS — C7949 Secondary malignant neoplasm of other parts of nervous system: Principal | ICD-10-CM

## 2018-10-11 MED FILL — CRESEMBA 186 MG CAPSULE: 186 | 28 days supply | Qty: 56 | Fill #3

## 2018-10-15 ENCOUNTER — Telehealth: Payer: Self-pay | Admitting: *Deleted

## 2018-10-15 ENCOUNTER — Other Ambulatory Visit: Payer: Self-pay | Admitting: *Deleted

## 2018-10-15 DIAGNOSIS — C7931 Secondary malignant neoplasm of brain: Secondary | ICD-10-CM

## 2018-10-15 DIAGNOSIS — Z170 Estrogen receptor positive status [ER+]: Principal | ICD-10-CM

## 2018-10-15 DIAGNOSIS — R590 Localized enlarged lymph nodes: Secondary | ICD-10-CM

## 2018-10-15 DIAGNOSIS — C50212 Malignant neoplasm of upper-inner quadrant of left female breast: Secondary | ICD-10-CM

## 2018-10-15 NOTE — Telephone Encounter (Signed)
This RN spoke with pt per her call stating noted onset about 3 days ago of palpable lumps under left arm " where I had lymph nodes removed "  Melissa [name] states she had been doing gardening last week and developed some discomfort- then when she began to check area out she noted lumps.  Lumps are not tender to touch ( " unless I really press down on them ) nor does area have increased warmth.  Area is not swollen nor is left arm.  Order placed for mammogram ( last done 09/22/2017 ) as well as [name] with biopsy if needed.

## 2018-10-23 ENCOUNTER — Telehealth: Payer: Self-pay | Admitting: Medical

## 2018-10-23 ENCOUNTER — Telehealth: Payer: Self-pay | Admitting: *Deleted

## 2018-10-23 NOTE — Telephone Encounter (Signed)
Received call from pt stating she has been experiencing some "weird marking on her body".  Pt states that on her right leg calf area she is having a red/brown discoloration that looks like "a slug trail".  Pt denies pain, warmth to the touch, or discharge.  Pt also states she has bruising around her waistline but denies any type of trama.  Pt states she may have ringworm but is unsure.  Pt denies fever and any sick contacts.  Pt states she has not been around anyone that has tested positive for covid and denies symptoms. Apt made for pt to see Sandi Mealy, PA with symptom management on Thursday 10/25/2018 at 9:30 am.  Pt verbalized understanding.

## 2018-10-23 NOTE — Telephone Encounter (Signed)
Scheduled appt per 4/14 sch message - unable to reach patient. Left message with appt date and time

## 2018-10-24 ENCOUNTER — Telehealth: Payer: Self-pay | Admitting: Internal Medicine

## 2018-10-24 ENCOUNTER — Ambulatory Visit: Payer: 59 | Admitting: Family

## 2018-10-24 MED FILL — TYKERB 250 MG TABLET: 250 | 30 days supply | Qty: 120 | Fill #2

## 2018-10-24 NOTE — Telephone Encounter (Signed)
Received her form  It is a disability form  Must go through [employer] with the pt and notified of this and she verbalized understanding  I faxed forms to [employer] and gave her number to call and discuss process with them  Nothing further needed at this time

## 2018-10-24 NOTE — Telephone Encounter (Signed)
See other phone note dated today 

## 2018-10-24 NOTE — Telephone Encounter (Signed)
Spoke with Caryl Pina at [facility]  She has not faxed over forms but will do so in a few min  Will forward to myself to f/u on

## 2018-10-24 NOTE — Telephone Encounter (Signed)
Returned call to patient re: form.  Patient states Dr. Storm Frisk office of infectious disease is faxing [name] a form to complete for patient's disability.  Patient states she filed for her disability 'on her own' and has been approved but the disability is related to her lung condition and needs Dr. Melvyn Novas to verify this.   She states she 'lost her job' and she feels it was due to health reasons which is why she filed for disability.  She also mentioned a personal loan and completing this information would assist her in negotiating the loan agreement. Unclear as to what this meant and patient states 'it's all on the form' and she just wanted [name] to be on the look out for it.   Advised patient we would have Dr. Melvyn Novas review the form once received and would contact her with updates.  Will route to [facility] for follow up.

## 2018-10-25 ENCOUNTER — Ambulatory Visit
Admission: RE | Admit: 2018-10-25 | Discharge: 2018-10-25 | Disposition: A | Discharge status: Home / Self Care | Payer: 59 | Source: Ambulatory Visit | Attending: Hematology and Oncology | Admitting: Hematology and Oncology

## 2018-10-25 ENCOUNTER — Ambulatory Visit (HOSPITAL_COMMUNITY)
Admission: RE | Admit: 2018-10-25 | Discharge: 2018-10-25 | Disposition: A | Discharge status: Home / Self Care | Payer: 59 | Source: Ambulatory Visit | Attending: Medical | Admitting: Medical

## 2018-10-25 ENCOUNTER — Inpatient Hospital Stay: Payer: 59 | Attending: Medical | Admitting: Medical

## 2018-10-25 ENCOUNTER — Other Ambulatory Visit: Payer: Self-pay

## 2018-10-25 VITALS — BP 116/68 | HR 78 | Temp 98.6°F | Resp 18

## 2018-10-25 DIAGNOSIS — C50212 Malignant neoplasm of upper-inner quadrant of left female breast: Secondary | ICD-10-CM

## 2018-10-25 DIAGNOSIS — C7931 Secondary malignant neoplasm of brain: Secondary | ICD-10-CM

## 2018-10-25 DIAGNOSIS — Z170 Estrogen receptor positive status [ER+]: Principal | ICD-10-CM

## 2018-10-25 DIAGNOSIS — R590 Localized enlarged lymph nodes: Secondary | ICD-10-CM

## 2018-10-25 DIAGNOSIS — R231 Pallor: Secondary | ICD-10-CM | POA: Insufficient documentation

## 2018-10-25 NOTE — Progress Notes (Signed)
Right lower extremity venous duplex completed. Preliminary results in Chart review CV Proc. [place] Keelyn Fjelstad,RVS 10/25/2018, 11:54 PM

## 2018-10-25 NOTE — Progress Notes (Signed)
Symptoms Management Clinic Progress Note   WILLO YOON [id number] 05-02-1966 53 y.o.  SHAMERIA TRIMARCO is managed by Dr. 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  She was referred for a Doppler ultrasound of the right lower extremity which returned showing no evidence of a DVT.  Consideration was given for placing the patient on low-dose Plavix given her allergy to aspirin.  However after discussing this further with Dr. Nicholas Lose it was decided that no treatment would be given based on the patient's history of brain metastasis.  This was reviewed with the patient.  She expressed understanding and agreement with this plan.  She has been instructed to call should she show progression of livedo reticularis.  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 Nicholas Lose.  She continues on lapatinib and Tykerb.  She is scheduled to see him in follow-up on 11/26/2018.  Please see After Visit Summary for patient specific instructions.  Future Appointments  Date Time Provider [place]  11/05/2018  9:15 AM Darletta Moll, RPH-CPP [facility]  11/26/2018  2:15 PM Nicholas Lose, MD [facility] None  12/06/2018 10:50 AM GI-315 MR 3 GI-315MRI GI-[address]  12/10/2018  9:00 AM Carlyle Basques, MD [facility]  12/12/2018  1:30 PM Bruning, Ashlyn, PA-C [facility] None    No orders of the defined types were placed in this encounter.       Subjective:   Patient ID:  AVIANA SHEVLIN is a 53 y.o. (DOB 06-07-1966) female.  Chief Complaint:  Chief Complaint  Patient presents with  . Rash    HPI SYANNE LOONEY is a 53 year old female with a history of a metastatic ER positive breast cancer with pulmonary and brain metastasis.  She is followed by Dr. Nicholas Lose and is treated with lapatinib and Tykerb.  She presents to the office today with a report that she noted a rash on her right posterior and medial calf this past weekend while working in her yard.  She denies any changes in activity, trauma, or new medications.  She has a history of atherosclerotic disease and is a cigarette smoker.  She was noted to have a rash over her lower back which is since resolved.  She believes that the rash of her lower back may have been associated with her use of a heating pad.  She denies any other issues of concern such as chest pain or shortness of breath.  She is having no episodes of claudication.  Medications: I have reviewed the patient's current medications.  Allergies:  Allergies  Allergen Reactions  . Aspirin Anaphylaxis and Shortness Of Breath    THROAT CLOSES  . Protonix [Pantoprazole] Nausea Only and Other (See Comments)    Also caused a "film in the mouth" and caused chest pressure  . Iodinated Diagnostic Agents Rash    Past Medical History:  Diagnosis Date  . Anemia   . Arthritis    knees and hips  . Asthma   . Breast  cancer ([place])   . Bronchiectasis ([place])   . Bronchiolitis   . Cancer [facility])    breast cancer 2014  . Complication of anesthesia    bp dropped + desat   . COPD (chronic obstructive pulmonary disease) ([place])   . Dyspnea    DOE  . GERD (gastroesophageal reflux disease)   . H/O coccidioidomycosis    was reason for lung lobectomy  . Headache(784.0)    due to eye strain or not eating  . History of anemia    no current problem  . History of asthma    as a child  . History of breast cancer 2014   left  .  History of chemotherapy    finished 07/17/2013  . History of hiatal hernia    AGE 20  . Hx of radiation therapy 03/25/13-05/06/13   left breast 5000 cGy/25 sessions, left breast boost 1000 cGy/5 sessions  . Pneumonia    LAST FLARE UP 01/2018  . Runny nose 07/30/2013   clear drainage  . Wears dentures    upper    Past Surgical History:  Procedure Laterality Date  . APPLICATION OF CRANIAL NAVIGATION N/A 08/04/2017   Procedure: APPLICATION OF CRANIAL NAVIGATION;  Surgeon: [name], [name], MD;  Location: [place];  Service: Neurosurgery;  Laterality: N/A;  . AXILLARY LYMPH NODE DISSECTION Left 02/08/2013   Procedure: LEFT AXILLARY DISSECTION;  Surgeon: Edward Jolly, MD;  Location: [place];  Service: General;  Laterality: Left;  . BREAST CYST EXCISION Right 2006  . BREAST LUMPECTOMY Left 2014  . BREAST LUMPECTOMY WITH NEEDLE LOCALIZATION AND AXILLARY SENTINEL LYMPH NODE BX Left 12/31/2012   Procedure: NEEDLE LOCALIZATION LEFT BREAST LUMPECTOMY AND LEFT AXILLARY SENTENIAL LYMPH NODE BX;  Surgeon: Edward Jolly, MD;  Location: [place];  Service: General;  Laterality: Left;  . CESAREAN SECTION  1995/1996  . CRANIOTOMY Right 08/04/2017   Procedure: Right Frontal craniotomy for resection of tumor with stereotactic navigation;  Surgeon: [name], [name], MD;  Location: [place];  Service: Neurosurgery;  Laterality: Right;  Right Frontal craniotomy for resection of tumor with stereotactic navigation  . LUNG LOBECTOMY Left 05/1996   upper lobe - due to [facility] Fever  . PORT-A-CATH REMOVAL Right 08/02/2013   Procedure: REMOVAL PORT-A-CATH;  Surgeon: Edward Jolly, MD;  Location: [place];  Service: General;  Laterality: Right;  . PORTACATH PLACEMENT  07/02/2012   Procedure: INSERTION PORT-A-CATH;  Surgeon: Edward Jolly, MD;  Location: [place];  Service: General;  Laterality: N/A;  right  . VIDEO BRONCHOSCOPY  WITH ENDOBRONCHIAL ULTRASOUND N/A 05/23/2018   Procedure: VIDEO BRONCHOSCOPY WITH ENDOBRONCHIAL ULTRASOUND;  Surgeon: Garner Nash, DO;  [facility];  Service: Thoracic;  Laterality: N/A;    Family History  Problem Relation Age of Onset  . Emphysema Mother        was a smoker  . Heart disease Mother   . Melanoma Mother        dx in her 35s  . Breast cancer Mother 90  . Asthma Brother   . Breast cancer Cousin        mother's maternal cousin; dx in her 39s    Social History   Socioeconomic History  . Marital status: Married    Spouse name: Not on file  . Number of children: 2  . Years of education: Not on file  . Highest education level: Not on file  Occupational  History  . Occupation: Secondary school teacher  Social Needs  . Financial resource strain: Not on file  . Food insecurity:    Worry: Not on file    Inability: Not on file  . Transportation needs:    Medical: Not on file    Non-medical: Not on file  Tobacco Use  . Smoking status: Current Every Day Smoker    Packs/day: 0.75    Years: 24.00    Pack years: 18.00    Types: Cigarettes  . Smokeless tobacco: Never Used  Substance and Sexual Activity  . Alcohol use: No  . Drug use: No  . Sexual activity: Yes    Comment: trying to quit  Lifestyle  . Physical activity:    Days per week: Not on file    Minutes per session: Not on file  . Stress: Not on file  Relationships  . Social connections:    Talks on phone: Not on file    Gets together: Not on file    Attends religious service: Not on file    Active member of club or organization: Not on file    Attends meetings of clubs or organizations: Not on file    Relationship status: Not on file  . Intimate partner violence:    Fear of current or ex partner: No    Emotionally abused: No    Physically abused: No    Forced sexual activity: No  Other Topics Concern  . Not on file  Social History Narrative  . Not on file    Past Medical History, Surgical  history, Social history, and Family history were reviewed and updated as appropriate.   Please see review of systems for further details on the patient's review from today.   Review of Systems:  Review of Systems  Constitutional: Negative for chills, diaphoresis and fever.  HENT: Negative for trouble swallowing and voice change.   Respiratory: Negative for cough, chest tightness, shortness of breath and wheezing.   Cardiovascular: Negative for chest pain and palpitations.  Gastrointestinal: Negative for abdominal pain, constipation, diarrhea, nausea and vomiting.  Musculoskeletal: Negative for back pain and myalgias.  Skin: Positive for rash.  Neurological: Negative for dizziness, light-headedness and headaches.    Objective:   Physical Exam:  BP 116/68 (BP Location: Right Arm, Patient Position: Sitting)   Pulse 78   Temp 98.6 F (37 C) (Oral)   Resp 18   LMP 07/25/2012 Comment: pregnancy waiver form signed 01-18-2018  SpO2 100%  ECOG: 0  Physical Exam Constitutional:      General: She is not in acute distress.    Appearance: Normal appearance. She is not ill-appearing or diaphoretic.  HENT:     Head: Normocephalic and atraumatic.  Skin:    Findings: Rash present.     Comments: A mottled, hyperpigmented macular rash was noted over the right posterior and medial calf.  Neurological:     General: No focal deficit present.     Mental Status: She is alert and oriented to person, place, and time.     Gait: Gait normal.  Psychiatric:        Mood and Affect: Mood normal.        Behavior: Behavior normal.        Thought Content: Thought content normal.        Judgment: Judgment normal.        Lab Review:     Component Value Date/Time   NA 141 10/04/2018 1409   NA  142 08/12/2014 0956   K 5.2 10/04/2018 1409   K 4.6 08/12/2014 0956   CL 104 10/04/2018 1409   CL 100 12/20/2012 1509   CO2 26 10/04/2018 1409   CO2 26 08/12/2014 0956   GLUCOSE 83 10/04/2018 1409    GLUCOSE 94 08/12/2014 0956   GLUCOSE 98 12/20/2012 1509   BUN 18 10/04/2018 1409   BUN 10.1 08/12/2014 0956   CREATININE 0.91 10/04/2018 1409   CREATININE 0.9 08/12/2014 0956   CALCIUM 10.0 10/04/2018 1409   CALCIUM 9.3 08/12/2014 0956   PROT 7.3 10/04/2018 1409   PROT 7.1 08/12/2014 0956   ALBUMIN 3.6 07/27/2018 1120   ALBUMIN 3.8 08/12/2014 0956   AST 18 10/04/2018 1409   AST 13 (L) 07/27/2018 1120   AST 15 08/12/2014 0956   ALT 13 10/04/2018 1409   ALT 23 07/27/2018 1120   ALT 11 08/12/2014 0956   ALKPHOS 147 (H) 07/27/2018 1120   ALKPHOS 68 08/12/2014 0956   BILITOT 0.2 10/04/2018 1409   BILITOT <0.2 (L) 07/27/2018 1120   BILITOT 0.25 08/12/2014 0956   GFRNONAA 74 10/04/2018 1409   GFRAA 85 10/04/2018 1409       Component Value Date/Time   WBC 12.9 (H) 07/27/2018 1120   WBC 10.0 06/25/2018 1531   RBC 3.77 (L) 07/27/2018 1120   HGB 10.6 (L) 07/27/2018 1120   HGB 13.1 08/12/2014 0955   HCT 32.9 (L) 07/27/2018 1120   HCT 40.4 08/12/2014 0955   PLT 431 (H) 07/27/2018 1120   PLT 228 08/12/2014 0955   MCV 87.3 07/27/2018 1120   MCV 98.2 08/12/2014 0955   MCH 28.1 07/27/2018 1120   MCHC 32.2 07/27/2018 1120   RDW 18.0 (H) 07/27/2018 1120   RDW 13.0 08/12/2014 0955   LYMPHSABS 2.6 07/27/2018 1120   LYMPHSABS 2.0 08/12/2014 0955   MONOABS 0.6 07/27/2018 1120   MONOABS 0.5 08/12/2014 0955   EOSABS 0.5 07/27/2018 1120   EOSABS 0.2 08/12/2014 0955   BASOSABS 0.1 07/27/2018 1120   BASOSABS 0.0 08/12/2014 0955   -------------------------------  Imaging from last 24 hours (if applicable):  Radiology interpretation: Vas [name] Lower Extremity Venous (dvt)  Result Date: 10/25/2018  Lower Venous Study Other Indications: Right calf Livedo Reticularis. Risk Factors: Cancer Breast. Performing Technologist: Toma Copier RVS  Examination Guidelines: A complete evaluation includes B-mode imaging, spectral Doppler, color Doppler, and power Doppler as needed of all accessible  portions of each vessel. Bilateral testing is considered an integral part of a complete examination. Limited examinations for reoccurring indications may be performed as noted.  +---------+---------------+---------+-----------+----------+-------+ RIGHT    CompressibilityPhasicitySpontaneityPropertiesSummary +---------+---------------+---------+-----------+----------+-------+ CFV      Full           Yes      Yes                          +---------+---------------+---------+-----------+----------+-------+ SFJ      Full                                                 +---------+---------------+---------+-----------+----------+-------+ FV Prox  Full           Yes      Yes                          +---------+---------------+---------+-----------+----------+-------+  FV Mid   Full                                                 +---------+---------------+---------+-----------+----------+-------+ FV DistalFull           Yes      Yes                          +---------+---------------+---------+-----------+----------+-------+ PFV      Full           Yes      Yes                          +---------+---------------+---------+-----------+----------+-------+ POP      Full           Yes      Yes                          +---------+---------------+---------+-----------+----------+-------+ PTV      Full                                                 +---------+---------------+---------+-----------+----------+-------+ PERO     Full                                                 +---------+---------------+---------+-----------+----------+-------+   +----+---------------+---------+-----------+----------+-------+ LEFTCompressibilityPhasicitySpontaneityPropertiesSummary +----+---------------+---------+-----------+----------+-------+ CFV Full           Yes      Yes                          +----+---------------+---------+-----------+----------+-------+  SFJ Full                                                 +----+---------------+---------+-----------+----------+-------+     Summary: Right: There is no evidence of deep vein thrombosis in the lower extremity. No cystic structure found in the popliteal fossa. Left: There is no evidence of a common femoral vein obstruction.  *See table(s) above for measurements and observations.    Preliminary         This case was discussed with Dr. Lindi Adie. He expresses agreement with my management of this patient.

## 2018-10-25 NOTE — Progress Notes (Signed)
Pt presents with possible bruising/mottling around R inner calf that goes into R inner knee, also found on lower back.  Denies recent injury or excessive bleeding.  Denies CP or SOB, rash, F/C, or any other pain or issues.  Pt taking oral chemo.  Also reports intermittent "falling asleep" in her legs when elevated and legs appearing to "jerk awake" when in this position.

## 2018-10-26 NOTE — Progress Notes (Signed)
These results were called to [name] and were reviewed with her. Her questions were answered. She expressed understanding.

## 2018-11-02 ENCOUNTER — Telehealth: Payer: Self-pay | Admitting: Internal Medicine

## 2018-11-02 NOTE — Telephone Encounter (Signed)
COVID-19 Pre-Screening Questions:11/02/2018  Do you currently have a fever (>100 F), chills or unexplained body aches? NO  Are you currently experiencing new cough, shortness of breath, sore throat, runny nose? NO   Have you recently travelled outside the state of [place] in the last 14 days? NO   Have you been in contact with someone that is currently pending confirmation of Covid19 testing or has been confirmed to have the [place] virus?  NO  **If the patient answers NO to ALL questions -  advise the patient to please call the clinic before coming to the office should any symptoms develop.

## 2018-11-05 ENCOUNTER — Ambulatory Visit (INDEPENDENT_AMBULATORY_CARE_PROVIDER_SITE_OTHER): Payer: 59 | Admitting: Pharmacist

## 2018-11-05 ENCOUNTER — Other Ambulatory Visit: Payer: Self-pay

## 2018-11-05 DIAGNOSIS — Z5181 Encounter for therapeutic drug level monitoring: Secondary | ICD-10-CM | POA: Diagnosis not present

## 2018-11-05 DIAGNOSIS — J984 Other disorders of lung: Secondary | ICD-10-CM

## 2018-11-05 LAB — COMPREHENSIVE METABOLIC PANEL
AG Ratio: 1.3 (calc) (ref 1.0–2.5)
ALT: 13 U/L (ref 6–29)
AST: 15 U/L (ref 10–35)
Albumin: 4.3 g/dL (ref 3.6–5.1)
Alkaline phosphatase (APISO): 104 U/L (ref 37–153)
BUN: 9 mg/dL (ref 7–25)
CO2: 28 mmol/L (ref 20–32)
Calcium: 10.2 mg/dL (ref 8.6–10.4)
Chloride: 105 mmol/L (ref 98–110)
Creat: 0.99 mg/dL (ref 0.50–1.05)
Globulin: 3.2 g/dL (calc) (ref 1.9–3.7)
Glucose, Bld: 87 mg/dL (ref 65–99)
Potassium: 4.7 mmol/L (ref 3.5–5.3)
Sodium: 141 mmol/L (ref 135–146)
Total Bilirubin: 0.3 mg/dL (ref 0.2–1.2)
Total Protein: 7.5 g/dL (ref 6.1–8.1)

## 2018-11-05 LAB — CBC
HCT: 33.6 % — ABNORMAL LOW (ref 35.0–45.0)
Hemoglobin: 10.9 g/dL — ABNORMAL LOW (ref 11.7–15.5)
MCH: 28.7 pg (ref 27.0–33.0)
MCHC: 32.4 g/dL (ref 32.0–36.0)
MCV: 88.4 fL (ref 80.0–100.0)
MPV: 9.3 fL (ref 7.5–12.5)
Platelets: 439 10*3/uL — ABNORMAL HIGH (ref 140–400)
RBC: 3.8 10*6/uL (ref 3.80–5.10)
RDW: 15.6 % — ABNORMAL HIGH (ref 11.0–15.0)
WBC: 8.9 10*3/uL (ref 3.8–10.8)

## 2018-11-05 NOTE — Progress Notes (Signed)
HPI: Melissa [name] is a 53 y.o. 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  Patient Active Problem List   Diagnosis Date Noted  . Mediastinal adenopathy   . H/O coccidioidomycosis 05/16/2018  . DOE (dyspnea on exertion) 05/03/2018  . Diarrhea 04/22/2018  . Cavitary lesion of lung in area of previous cyts in apex of Sup Segment of LLL 03/29/2018  . Aortic atherosclerosis ([place]) 01/22/2018  . Emphysema lung ([place]) 01/22/2018  . CAP (community acquired pneumonia) 11/23/2017  . Metastasis to brain ([place]) 08/04/2017  . Brain metastasis ([place]) 07/27/2017  . Nicotine abuse 07/27/2017  . Migraines 07/27/2017  . HCAP (healthcare-associated pneumonia) 11/26/2016  . Upper airway cough syndrome 08/10/2016  . Abnormal echocardiogram 08/07/2013  . Chest tightness or pressure 08/07/2013  . Hx of radiation therapy   . Edema of left lower extremity 11/19/2012  . Tachycardia 09/20/2012  . Breast cancer of upper-inner quadrant of left female breast ([place]) 06/08/2012  . Obstructive bronchiectasis ([place]) 01/26/2011  . Cigarette smoker 01/26/2011    Patient's Medications  New Prescriptions   No medications on file  Previous Medications   ACETAMINOPHEN-CODEINE (TYLENOL #3) 300-30 MG TABLET    Take 1 tablet by mouth every 4 (four) hours as needed (cough).   ALBUTEROL (VENTOLIN HFA) 108 (90 BASE) MCG/ACT INHALER    Inhale 2 puffs into the lungs every 6 (six) hours as needed.   BUDESONIDE-FORMOTEROL (SYMBICORT) 80-4.5 MCG/ACT INHALER    Inhale 2 puffs into the lungs 2 (two) times daily.   CHLORPHENIRAMINE-CODEINE 2-10 MG/5ML LIQD    Take 5 mLs by mouth every 6 (six) hours as needed.   ISAVUCONAZONIUM SULFATE (CRESEMBA) 186 MG CAPS    Take 2 capsules (372 mg total) by mouth 3 (three) times daily. X 2 days. Followed by 2 capsules per day thereafter   LAPATINIB (TYKERB) 250 MG TABLET    Take 4 tablets (1,000 mg total) by mouth daily. Take on an  empty stomach, at least 1 hour before or 1 hour after meals.   LETROZOLE (FEMARA) 2.5 MG TABLET    Take 1 tablet (2.5 mg total) by mouth daily.   LEVALBUTEROL (XOPENEX) 0.63 MG/3ML NEBULIZER SOLUTION    Take 3 mLs (0.63 mg total) by nebulization every 4 (four) hours as needed for wheezing or shortness of breath.   NAPROXEN SODIUM (ALEVE) 220 MG TABLET    Take 440 mg by mouth 2 (two) times daily as needed (headache).   Modified Medications   No medications on file  Discontinued Medications   No medications on file    Allergies: Allergies  Allergen Reactions  . Aspirin Anaphylaxis and Shortness Of Breath    THROAT CLOSES  . Protonix [Pantoprazole] Nausea Only and Other (See Comments)    Also caused a "film in the mouth" and caused chest pressure  . Iodinated Diagnostic Agents Rash    Past Medical History: Past Medical History:  Diagnosis Date  . Anemia   . Arthritis    knees and hips  . Asthma   . Breast cancer ([place])   . Bronchiectasis ([place])   . Bronchiolitis   . Cancer [facility])    breast cancer 2014  . Complication of anesthesia    bp dropped + desat   . COPD (chronic obstructive pulmonary disease) ([place])   . Dyspnea    DOE  . GERD (gastroesophageal reflux disease)   . H/O coccidioidomycosis    was reason for  lung lobectomy  . Headache(784.0)    due to eye strain or not eating  . History of anemia    no current problem  . History of asthma    as a child  . History of breast cancer 2014   left  . History of chemotherapy    finished 07/17/2013  . History of hiatal hernia    AGE 13  . Hx of radiation therapy 03/25/13-05/06/13   left breast 5000 cGy/25 sessions, left breast boost 1000 cGy/5 sessions  . Pneumonia    LAST FLARE UP 01/2018  . Runny nose 07/30/2013   clear drainage  . Wears dentures    upper    Social History: Social History   Socioeconomic History  . Marital status: Married    Spouse name: Not on file  . Number of children: 2  . Years of education:  Not on file  . Highest education level: Not on file  Occupational History  . Occupation: Secondary school teacher  Social Needs  . Financial resource strain: Not on file  . Food insecurity:    Worry: Not on file    Inability: Not on file  . Transportation needs:    Medical: Not on file    Non-medical: Not on file  Tobacco Use  . Smoking status: Current Every Day Smoker    Packs/day: 0.75    Years: 24.00    Pack years: 18.00    Types: Cigarettes  . Smokeless tobacco: Never Used  Substance and Sexual Activity  . Alcohol use: No  . Drug use: No  . Sexual activity: Yes    Comment: trying to quit  Lifestyle  . Physical activity:    Days per week: Not on file    Minutes per session: Not on file  . Stress: Not on file  Relationships  . Social connections:    Talks on phone: Not on file    Gets together: Not on file    Attends religious service: Not on file    Active member of club or organization: Not on file    Attends meetings of clubs or organizations: Not on file    Relationship status: Not on file  Other Topics Concern  . Not on file  Social History Narrative  . Not on file    Assessment: Melissa [name] comes in today to follow-up for her pulmonary aspergillus infection.  She has been on [place] for several months (started in December 2019) and is doing well on the medication.  She does not have any side effects associated with the medication and states that she has done a good job about not missing doses and taking it everyday.  No issues with getting it from [facility] or paying for it.  She has finally filed for disability and is in the process of securing that. No new medications since we last saw her. Her labs were WNL last month, and we will check again today.  She will follow-up with Dr. Baxter Flattery on June 1st. No issues or questions today.  Plan: - Continue Cresemba - 2 capsules PO daily - CMET and CBC today - F/u with Dr. Baxter Flattery 6/1 at [place].  [place], PharmD, BCIDP, AAHIVP, [place] [facility] 11/06/2018, 2:02 PM

## 2018-11-09 ENCOUNTER — Telehealth: Payer: Self-pay | Admitting: Pulmonary Disease

## 2018-11-09 NOTE — Telephone Encounter (Signed)
Called and spoke with Patient.  Patient stated she had a bronchoscopy in 2019,  by Dr Valeta Harms, and it revealed aspergillus. Patient stated she was taken out of work for 2 weeks, due to her breathing issues, and was terminated from her job 08/2018.   Patient stated she was exposed to mold 10/19/2017, because she had to go inside one of the company apartments and take pictures and document the mold in the apartment.  Patient stated she started having trouble with her lungs shortly afterwards. Patient has hired an Forensic psychologist.  Her attorney requested her to get a letter from her pulmonologist stating it was possible that breathing the mold caused her issues. Patient was last seen 08/07/18, by Dr Melvyn Novas.  08/07/18 OV-  Instructions   The key is to stop smoking completely before smoking completely stops you!  For smoking cessation classes call [phone]      No change in meds    Follow up every 6 months      Message routed to Dr Melvyn Novas

## 2018-11-12 NOTE — Telephone Encounter (Signed)
Called and spoke with patient regarding MW recommendations Advised pt she needs to contact Dr. Crissie Figures office today regarding her concerns Pt advised that she already has requested her medical records Nothing further needed at this time from our office

## 2018-11-12 NOTE — Telephone Encounter (Signed)
We can certainly send her records but in terms of an opinion about the role of an aspergillus exposure related to an infection for which she received treatment proving causality ( which is what her lawyer will insist on) that needs to come from the doctor treating the infection (Dr Graylon Good).   For example, it's also possible the aspergillus was not he cause of her symptoms or her findings on Ct scan as it exists in nature in other places besides the appt she was inspecting  - that's why ID will need to have the final say.  My last note (which I can't legally change) casts doubt on the role of aspergillus (but doesn't rule it out)  so I think she'd be better off with Dr Crissie Figures opinion on this.

## 2018-11-21 NOTE — Assessment & Plan Note (Signed)
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 Anti Her 2 therapy with Lapatinibstarted 09/17/2017 3.I discontinuedtamoxifen and started her on letrozole 2.5 mg daily.  Lapatinib toxicities:Currently on 1000 mglapatinibwith Letrozole 2.5 mg Occasional diarrhea. She is working and is very happy about it  Stereotactic radiosurgery 12/19/2017 to the new right parietal lobe metastases.  CT CAP: 03/23/18: LUL cavitary arch destortion with ground glass attenuation subpleural LUL (Radiation pneumonitis vs Inf); Inc in size of left mediastinal LN (non specific), LUL nodule 4 mm  Bronchoscopy and biopsy revealed aspergillosis Patient is seeing infectious disease as well as pulmonary with Dr. Melvyn Novas. She completed antifungal therapy and is planning to see Dr. Baxter Flattery.  CT chest 07/25/2018: Prior left upper lobectomy, cylindrical bronchiectasis left lower lobe with thickening and mucous plugging, inflammatory changes are improved.  New groundglass nodularity right lung MRI brain 05/18/2018: Right frontal resection cavity 4 mm Mammogram 10/25/2018: No evidence of malignancy  Return to clinic in2 months for follow-up after scans.

## 2018-11-24 NOTE — Progress Notes (Signed)
Patient Care Team: Default, Provider, MD as PCP - Cristopher Estimable, MD as Referring Physician (Emergency Medicine)  DIAGNOSIS:    ICD-10-CM   1. Malignant neoplasm of upper-inner quadrant of left breast in female, estrogen receptor positive ([place]) C50.212    Z17.0     SUMMARY OF ONCOLOGIC HISTORY:   Breast cancer of upper-inner quadrant of left female breast ([place] [facility])   06/08/2012 Initial Diagnosis    invasive ductal carcinoma that was ER positive PR positive HER-2/neu positive measuring 3.1 cm by MRI criteria. Ki-67 was 70% HER-2 was amplified with a ratio 2.91    07/12/2012 - 07/17/2013 Neo-Adjuvant Chemotherapy    TCH 6 followed by Herceptin maintenance    12/11/2012 Surgery    Left breast lumpectomy: 1.8 cm tumor 1 positive sentinel node, axillary lymph node dissection 02/08/2013 showed 0/13 lymph nodes    03/25/2013 - 05/06/2013 Radiation Therapy    Adjuvant radiation therapy    06/05/2013 - 07/20/2017 Anti-estrogen oral therapy    Tamoxifen 20 mg daily    07/27/2017 Relapse/Recurrence    MRI Brain: 3.4 x 2.9 x 2.9 cm RIGHT frontal lobe mass with imaging characteristics of solitary metastasis. Extensive vasogenic edema resulting in 9 mm RIGHT to LEFT midline shift. Equivocal very early LEFT ventricle entrapment.     08/04/2017 Surgery    Rt frontal brain resection: Poorly differentiated tumor IHC suggests breast primary ER and PR Positive    08/25/2017 - 09/04/2017 Radiation Therapy    Stereotactic radiation    09/18/2017 -  Anti-estrogen oral therapy    Lapatinib with letrozole    12/18/2017 - 12/19/2017 Radiation Therapy    New right parietal lobe metastases status post SRS     CHIEF COMPLIANT: Follow-up on lapatinib with letrozole  INTERVAL HISTORY: Melissa [name] is a 53 y.o. with above-mentioned history of metastatic breast cancer with brain metastasis s/p resection who is currently on letrozole therapy. Her most recent mammogram and [name] on 10/25/18 showed no evidence  of malignancy. 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 [name] of right lower extremity showed no DVT and she was not placed on low-dose Plavix due to her history of brain metastasis. She presents to the clinic today for 4 month follow-up.  She lost her job.  She is very sad about it.  She applied for disability and got the approval.  REVIEW OF SYSTEMS:   Constitutional: Denies fevers, chills or abnormal weight loss Eyes: Denies blurriness of vision Ears, nose, mouth, throat, and face: Denies mucositis or sore throat Respiratory: Denies cough, dyspnea or wheezes Cardiovascular: Denies palpitation, chest discomfort Gastrointestinal: Denies nausea, heartburn or change in bowel habits Skin: Denies abnormal skin rashes Lymphatics: Denies new lymphadenopathy or easy bruising Neurological: Denies numbness, tingling or new weaknesses Behavioral/Psych: Mood is stable, no new changes  Extremities: No lower extremity edema Breast: denies any pain or lumps or nodules in either breasts All other systems were reviewed with the patient and are negative.  I have reviewed the past medical history, past surgical history, social history and family history with the patient and they are unchanged from previous note.  ALLERGIES:  is allergic to aspirin; protonix [pantoprazole]; and iodinated diagnostic agents.  MEDICATIONS:  Current Outpatient Medications  Medication Sig Dispense Refill  . acetaminophen-codeine (TYLENOL #3) 300-30 MG tablet Take 1 tablet by mouth every 4 (four) hours as needed (cough). 30 tablet 0  . albuterol (VENTOLIN HFA) 108 (90 BASE) MCG/ACT inhaler Inhale  2 puffs into the lungs every 6 (six) hours as needed. 1 Inhaler 3  . budesonide-formoterol (SYMBICORT) 80-4.5 MCG/ACT inhaler Inhale 2 puffs into the lungs 2 (two) times daily. 1 Inhaler 5  . Chlorpheniramine-Codeine 2-10 MG/5ML LIQD Take 5 mLs by mouth every 6 (six) hours as needed. 200  mL 0  . Isavuconazonium Sulfate (CRESEMBA) 186 MG CAPS Take 2 capsules (372 mg total) by mouth 3 (three) times daily. X 2 days. Followed by 2 capsules per day thereafter 60 capsule 6  . lapatinib (TYKERB) 250 MG tablet Take 4 tablets (1,000 mg total) by mouth daily. Take on an empty stomach, at least 1 hour before or 1 hour after meals. 120 tablet 3  . letrozole (FEMARA) 2.5 MG tablet Take 1 tablet (2.5 mg total) by mouth daily. 30 tablet 11  . levalbuterol (XOPENEX) 0.63 MG/3ML nebulizer solution Take 3 mLs (0.63 mg total) by nebulization every 4 (four) hours as needed for wheezing or shortness of breath. 540 mL 6  . naproxen sodium (ALEVE) 220 MG tablet Take 440 mg by mouth 2 (two) times daily as needed (headache).      No current facility-administered medications for this visit.     PHYSICAL EXAMINATION: ECOG PERFORMANCE STATUS: 1 - Symptomatic but completely ambulatory  Vitals:   11/26/18 1407  BP: 114/61  Pulse: 86  Resp: 18  Temp: 98.7 F (37.1 C)  SpO2: 100%   Filed Weights   11/26/18 1407  Weight: 110 lb 3.2 oz (50 kg)    GENERAL: alert, no distress and comfortable SKIN: skin color, texture, turgor are normal, no rashes or significant lesions EYES: normal, Conjunctiva are pink and non-injected, sclera clear OROPHARYNX: no exudate, no erythema and lips, buccal mucosa, and tongue normal  NECK: supple, thyroid normal size, non-tender, without nodularity LYMPH: no palpable lymphadenopathy in the cervical, axillary or inguinal LUNGS: clear to auscultation and percussion with normal breathing effort HEART: regular rate & rhythm and no murmurs and no lower extremity edema ABDOMEN: abdomen soft, non-tender and normal bowel sounds MUSCULOSKELETAL: no cyanosis of digits and no clubbing  NEURO: alert & oriented x 3 with fluent speech, no focal motor/sensory deficits EXTREMITIES: No lower extremity edema  LABORATORY DATA:  I have reviewed the data as listed CMP Latest Ref Rng &  Units 11/05/2018 10/04/2018 07/27/2018  Glucose 65 - 99 mg/dL 87 83 120(H)  BUN 7 - 25 mg/dL 9 18 13  Creatinine 0.50 - 1.05 mg/dL 0.99 0.91 0.92  Sodium 135 - 146 mmol/L 141 141 140  Potassium 3.5 - 5.3 mmol/L 4.7 5.2 3.6  Chloride 98 - 110 mmol/L 105 104 103  CO2 20 - 32 mmol/L 28 26 27  Calcium 8.6 - 10.4 mg/dL 10.2 10.0 9.8  Total Protein 6.1 - 8.1 g/dL 7.5 7.3 8.0  Total Bilirubin 0.2 - 1.2 mg/dL 0.3 0.2 <0.2(L)  Alkaline Phos 38 - 126 U/L - - 147(H)  AST 10 - 35 U/L 15 18 13(L)  ALT 6 - 29 U/L 13 13 23    Lab Results  Component Value Date   WBC 8.9 11/05/2018   HGB 10.9 (L) 11/05/2018   HCT 33.6 (L) 11/05/2018   MCV 88.4 11/05/2018   PLT 439 (H) 11/05/2018   NEUTROABS 9.0 (H) 07/27/2018    ASSESSMENT & PLAN:  Breast cancer of upper-inner quadrant of left female breast ([facility]) 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 Anti Her 2 therapy with Lapatinibstarted 09/17/2017 3.I discontinuedtamoxifen and started her on letrozole 2.5 mg daily.  Lapatinib toxicities:Currently on 1000 mglapatinibwith Letrozole 2.5 mg Occasional diarrhea.  Stereotactic radiosurgery 12/19/2017 to the new right parietal lobe metastases.  CT CAP: 03/23/18: LUL cavitary arch destortion with ground glass attenuation subpleural LUL (Radiation pneumonitis vs Inf); Inc in size of left mediastinal LN (non specific), LUL nodule 4 mm  Bronchoscopy and biopsy revealed aspergillosis Patient is seeing infectious disease as well as pulmonary with Dr. Wert. She completed  antifungal therapy and is planning to see Dr. Snider.  CT chest 07/25/2018: Prior left upper lobectomy, cylindrical bronchiectasis left lower lobe with thickening and mucous plugging, inflammatory changes are improved.  New groundglass nodularity right lung MRI brain 05/18/2018: Right frontal resection cavity 4 mm Mammogram 10/25/2018: No evidence of malignancy  Patient tells me that her ID physician might be ordering scans.  Because of that I am not ordering any scans. She has a brain MRI scheduled for 12/06/2018.  She has an appointment to see radiation oncology on 12/12/2018.  I will add an appointment for her to see me after the radiation appointment to discuss if there is a need to change systemic therapy from lapatinib to Tucatinib.   No orders of the defined types were placed in this encounter.  The patient has a good understanding of the overall plan. she agrees with it. she will call with any problems that may develop before the next visit here.  Gudena, Vinay, MD 11/26/2018  I, Molly Dorshimer am acting as scribe for Dr. Vinay Gudena.  I have reviewed the above documentation for accuracy and completeness, and I agree with the above.      

## 2018-11-26 ENCOUNTER — Other Ambulatory Visit: Payer: Self-pay

## 2018-11-26 ENCOUNTER — Inpatient Hospital Stay: Payer: 59 | Attending: Medical | Admitting: Hematology and Oncology

## 2018-11-26 DIAGNOSIS — C7931 Secondary malignant neoplasm of brain: Secondary | ICD-10-CM | POA: Insufficient documentation

## 2018-11-26 DIAGNOSIS — C50212 Malignant neoplasm of upper-inner quadrant of left female breast: Secondary | ICD-10-CM | POA: Diagnosis not present

## 2018-11-26 DIAGNOSIS — Z170 Estrogen receptor positive status [ER+]: Secondary | ICD-10-CM

## 2018-11-26 DIAGNOSIS — R911 Solitary pulmonary nodule: Secondary | ICD-10-CM

## 2018-11-26 DIAGNOSIS — C773 Secondary and unspecified malignant neoplasm of axilla and upper limb lymph nodes: Secondary | ICD-10-CM | POA: Insufficient documentation

## 2018-11-26 MED FILL — TYKERB 250 MG TABLET: 250 | 30 days supply | Qty: 120 | Fill #3

## 2018-11-26 MED FILL — CRESEMBA 186 MG CAPSULE: 186 | 28 days supply | Qty: 56 | Fill #4

## 2018-11-27 ENCOUNTER — Telehealth: Payer: Self-pay | Admitting: Internal Medicine

## 2018-11-27 NOTE — Telephone Encounter (Signed)
Placed in Dr Wert's lookat 

## 2018-11-28 ENCOUNTER — Telehealth: Payer: Self-pay | Admitting: Hematology and Oncology

## 2018-11-28 NOTE — Telephone Encounter (Signed)
Rec'd uncompleted paperwork back from [place] - MW wants patient to schedule an appointment - fwd paperwork back to [place] via interoffice mail -pr

## 2018-11-28 NOTE — Telephone Encounter (Signed)
Called regarding schedule °

## 2018-11-28 NOTE — Telephone Encounter (Signed)
Called pt and made aware She says after being out for 2 weeks due to lung issues she was terminated. She says this paperwork is for a personal loan adjustment with [place] [facility]. She has already been approved for her disability. Pt declines appt at this time as she has been out of work and can't afford copay at this time. She doesn't think appt is really needed. Nothing further needed.

## 2018-11-28 NOTE — Telephone Encounter (Signed)
I've not treated her since January 2020 and can only comment directly on her care up to that point so if I'm happy to do so but don't think that will help her now   Will need ov with all meds in hand and key dates related to work stoppage and can address that then.

## 2018-12-04 ENCOUNTER — Other Ambulatory Visit: Payer: Self-pay | Admitting: Radiation Therapy

## 2018-12-05 NOTE — Assessment & Plan Note (Deleted)
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 Anti Her 2 therapy with Lapatinibstarted 09/17/2017 3.I discontinuedtamoxifen and started her on letrozole 2.5 mg daily.  Lapatinib toxicities:Currently on 1000 mglapatinibwith Letrozole 2.5 mg Occasional diarrhea.  Stereotactic radiosurgery 12/19/2017 to the new right parietal lobe metastases.  CT CAP: 03/23/18: LUL cavitary arch destortion with ground glass attenuation subpleural LUL (Radiation pneumonitis vs Inf); Inc in size of left mediastinal LN (non specific), LUL nodule 4 mm Bronchoscopy and biopsy revealed aspergillosis  Patient is seeing infectious disease as well as pulmonary with Dr. Melvyn Novas. She completed antifungal therapy and is planning to see Dr. Baxter Flattery.  CT chest 07/25/2018: Prior left upper lobectomy, cylindrical bronchiectasis left lower lobe with thickening and mucous plugging, inflammatory changes are improved. New groundglass nodularity right lung MRI brain 05/18/2018: Right frontal resection cavity 4 mm Mammogram 10/25/2018: No evidence of malignancy  Patient tells me that her ID physician might be ordering scans.  Because of that I am not ordering any scans. She has a brain MRI scheduled for 12/06/2018.  She has an appointment to see radiation oncology on 12/12/2018.    discuss if there is a need to change systemic therapy from lapatinib to Tucatinib based on the scans.

## 2018-12-06 ENCOUNTER — Ambulatory Visit
Admission: RE | Admit: 2018-12-06 | Discharge: 2018-12-06 | Disposition: A | Discharge status: Home / Self Care | Payer: 59 | Source: Ambulatory Visit | Attending: Radiation Oncology | Admitting: Radiation Oncology

## 2018-12-06 ENCOUNTER — Other Ambulatory Visit: Payer: Self-pay

## 2018-12-06 ENCOUNTER — Telehealth: Payer: Self-pay | Admitting: Internal Medicine

## 2018-12-06 DIAGNOSIS — C7931 Secondary malignant neoplasm of brain: Secondary | ICD-10-CM

## 2018-12-06 MED ORDER — GADOBENATE DIMEGLUMINE 529 MG/ML IV SOLN
10.0000 mL | Freq: Once | INTRAVENOUS | Status: AC | PRN
  Administered 2018-12-06: 10 mL via INTRAVENOUS

## 2018-12-06 NOTE — Telephone Encounter (Signed)
[id number] Pre-Screening Questions: 12/06/18  Do you currently have a fever (>100 F), chills or unexplained body aches?NO   Are you currently experiencing new cough, shortness of breath, sore throat, runny nose? NO Have you recently travelled outside the state of [place] in the last 14 days? NO   Have you been in contact with someone that is currently pending confirmation of Covid19 testing or has been confirmed to have the [place] virus?  NO  **If the patient answers NO to ALL questions -  advise the patient to please call the clinic before coming to the office should any symptoms develop.

## 2018-12-10 ENCOUNTER — Inpatient Hospital Stay: Payer: 59 | Attending: Medical

## 2018-12-10 ENCOUNTER — Encounter: Payer: Self-pay | Admitting: Internal Medicine

## 2018-12-10 ENCOUNTER — Ambulatory Visit (INDEPENDENT_AMBULATORY_CARE_PROVIDER_SITE_OTHER): Payer: 59 | Admitting: Internal Medicine

## 2018-12-10 ENCOUNTER — Other Ambulatory Visit: Payer: Self-pay

## 2018-12-10 VITALS — BP 106/66 | HR 69 | Temp 98.1°F | Wt 111.0 lb

## 2018-12-10 DIAGNOSIS — F5101 Primary insomnia: Secondary | ICD-10-CM

## 2018-12-10 DIAGNOSIS — Z5181 Encounter for therapeutic drug level monitoring: Secondary | ICD-10-CM

## 2018-12-10 DIAGNOSIS — B4489 Other forms of aspergillosis: Secondary | ICD-10-CM

## 2018-12-10 NOTE — Progress Notes (Signed)
RFV: follow up for pulmonary aspergillosis Patient ID: Melissa [name], female   DOB: [birth year], 53 y.o.   MRN: [id number]  HPI Melissa [name] is a 53yo F with metastatic breast ca being treated for pulmonary aspergillosis - pleurisy improved - still has shortness of breath - - still using symbicort 2 puffs BID, rescue inhaler  - with heat - albuterol breathing treatment QHS 9 (not daily) - but noticed more with being out in humidity  - having insomnia -> using 5mg  melatonin, and sleepy time teas  ROS: no fever, chills, nightsweats  Outpatient Encounter Medications as of 12/10/2018  Medication Sig  . acetaminophen-codeine (TYLENOL #3) 300-30 MG tablet Take 1 tablet by mouth every 4 (four) hours as needed (cough).  Marland Kitchen albuterol (VENTOLIN HFA) 108 (90 BASE) MCG/ACT inhaler Inhale 2 puffs into the lungs every 6 (six) hours as needed.  . budesonide-formoterol (SYMBICORT) 80-4.5 MCG/ACT inhaler Inhale 2 puffs into the lungs 2 (two) times daily.  . Chlorpheniramine-Codeine 2-10 MG/5ML LIQD Take 5 mLs by mouth every 6 (six) hours as needed.  . Isavuconazonium Sulfate (CRESEMBA) 186 MG CAPS Take 2 capsules (372 mg total) by mouth 3 (three) times daily. X 2 days. Followed by 2 capsules per day thereafter  . lapatinib (TYKERB) 250 MG tablet Take 4 tablets (1,000 mg total) by mouth daily. Take on an empty stomach, at least 1 hour before or 1 hour after meals.  Marland Kitchen letrozole (FEMARA) 2.5 MG tablet Take 1 tablet (2.5 mg total) by mouth daily.  Marland Kitchen levalbuterol (XOPENEX) 0.63 MG/3ML nebulizer solution Take 3 mLs (0.63 mg total) by nebulization every 4 (four) hours as needed for wheezing or shortness of breath.  . naproxen sodium (ALEVE) 220 MG tablet Take 440 mg by mouth 2 (two) times daily as needed (headache).    No facility-administered encounter medications on file as of 12/10/2018.      Patient Active Problem List   Diagnosis Date Noted  . Mediastinal adenopathy   . H/O coccidioidomycosis 05/16/2018  . DOE  (dyspnea on exertion) 05/03/2018  . Diarrhea 04/22/2018  . Cavitary lesion of lung in area of previous cyts in apex of Sup Segment of LLL 03/29/2018  . Aortic atherosclerosis ([place]) 01/22/2018  . Emphysema lung ([place]) 01/22/2018  . CAP (community acquired pneumonia) 11/23/2017  . Metastasis to brain ([place]) 08/04/2017  . Brain metastasis ([place]) 07/27/2017  . Nicotine abuse 07/27/2017  . Migraines 07/27/2017  . HCAP (healthcare-associated pneumonia) 11/26/2016  . Upper airway cough syndrome 08/10/2016  . Abnormal echocardiogram 08/07/2013  . Chest tightness or pressure 08/07/2013  . Hx of radiation therapy   . Edema of left lower extremity 11/19/2012  . Tachycardia 09/20/2012  . Breast cancer of upper-inner quadrant of left female breast ([place]) 06/08/2012  . Obstructive bronchiectasis ([place]) 01/26/2011  . Cigarette smoker 01/26/2011     Health Maintenance Due  Topic Date Due  . TETANUS/TDAP  01/18/1987  . PAP SMEAR-Modifier  11/20/2016  . COLONOSCOPY  01/17/2018     Review of Systems Per hpi, otherwise 12 point ros is negative  Physical Exam   BP 106/66   Pulse 69   Temp 98.1 F (36.7 C) (Oral)   Wt 111 lb (50.3 kg)   LMP 07/25/2012 Comment: pregnancy waiver form signed 01-18-2018  BMI 20.30 kg/m   Physical Exam  Constitutional:  oriented to person, place, and time. appears well-developed and well-nourished. No distress.  HENT: [place]/AT, PERRLA, no scleral icterus Mouth/Throat: Oropharynx is clear and moist. No oropharyngeal  exudate.  Cardiovascular: Normal rate, regular rhythm and normal heart sounds. Exam reveals no gallop and no friction rub.  No murmur heard.  Pulmonary/Chest: Effort normal and breath sounds normal. No respiratory distress.  has no wheezes.  Neck = supple, no nuchal rigidity Lymphadenopathy: no cervical adenopathy. No axillary adenopathy Neurological: alert and oriented to person, place, and time.  Skin: Skin is warm and dry. No rash noted. No erythema.   Psychiatric: a normal mood and affect.  behavior is normal.    CBC Lab Results  Component Value Date   WBC 8.9 11/05/2018   RBC 3.80 11/05/2018   HGB 10.9 (L) 11/05/2018   HCT 33.6 (L) 11/05/2018   PLT 439 (H) 11/05/2018   MCV 88.4 11/05/2018   MCH 28.7 11/05/2018   MCHC 32.4 11/05/2018   RDW 15.6 (H) 11/05/2018   LYMPHSABS 2.6 07/27/2018   MONOABS 0.6 07/27/2018   EOSABS 0.5 07/27/2018    BMET Lab Results  Component Value Date   NA 141 11/05/2018   K 4.7 11/05/2018   CL 105 11/05/2018   CO2 28 11/05/2018   GLUCOSE 87 11/05/2018   BUN 9 11/05/2018   CREATININE 0.99 11/05/2018   CALCIUM 10.2 11/05/2018   GFRNONAA 74 10/04/2018   GFRAA 85 10/04/2018      Assessment and Plan  Aspergillosis = chest ct (coordinate dr Lindi Adie). Continue with cresemba. Due in mid June-july  Drug monitoring se = will check LFT  insomnia =  minimize blue light, sleep hygiene  Letter for work?- since she thinks she had unlawful termination

## 2018-12-11 ENCOUNTER — Telehealth: Payer: Self-pay | Admitting: Hematology and Oncology

## 2018-12-11 LAB — CBC WITH DIFFERENTIAL/PLATELET
Absolute Monocytes: 673 cells/uL (ref 200–950)
Basophils Absolute: 79 cells/uL (ref 0–200)
Basophils Relative: 0.8 %
Eosinophils Absolute: 723 cells/uL — ABNORMAL HIGH (ref 15–500)
Eosinophils Relative: 7.3 %
HCT: 35.9 % (ref 35.0–45.0)
Hemoglobin: 11.5 g/dL — ABNORMAL LOW (ref 11.7–15.5)
Lymphs Abs: 2069 cells/uL (ref 850–3900)
MCH: 28.8 pg (ref 27.0–33.0)
MCHC: 32 g/dL (ref 32.0–36.0)
MCV: 89.8 fL (ref 80.0–100.0)
MPV: 9.8 fL (ref 7.5–12.5)
Monocytes Relative: 6.8 %
Neutro Abs: 6356 cells/uL (ref 1500–7800)
Neutrophils Relative %: 64.2 %
Platelets: 491 10*3/uL — ABNORMAL HIGH (ref 140–400)
RBC: 4 10*6/uL (ref 3.80–5.10)
RDW: 15.2 % — ABNORMAL HIGH (ref 11.0–15.0)
Total Lymphocyte: 20.9 %
WBC: 9.9 10*3/uL (ref 3.8–10.8)

## 2018-12-11 LAB — COMPLETE METABOLIC PANEL WITH GFR
AG Ratio: 1.4 (calc) (ref 1.0–2.5)
ALT: 20 U/L (ref 6–29)
AST: 28 U/L (ref 10–35)
Albumin: 4.5 g/dL (ref 3.6–5.1)
Alkaline phosphatase (APISO): 127 U/L (ref 37–153)
BUN: 12 mg/dL (ref 7–25)
CO2: 22 mmol/L (ref 20–32)
Calcium: 10.1 mg/dL (ref 8.6–10.4)
Chloride: 106 mmol/L (ref 98–110)
Creat: 0.92 mg/dL (ref 0.50–1.05)
GFR, Est African American: 84 mL/min/{1.73_m2} (ref >=60)
GFR, Est Non African American: 73 mL/min/{1.73_m2} (ref >=60)
Globulin: 3.3 g/dL (calc) (ref 1.9–3.7)
Glucose, Bld: 87 mg/dL (ref 65–99)
Potassium: 5.6 mmol/L — ABNORMAL HIGH (ref 3.5–5.3)
Sodium: 140 mmol/L (ref 135–146)
Total Bilirubin: 0.3 mg/dL (ref 0.2–1.2)
Total Protein: 7.8 g/dL (ref 6.1–8.1)

## 2018-12-11 NOTE — Progress Notes (Signed)
HEMATOLOGY-ONCOLOGY DOXIMITY VISIT PROGRESS NOTE  I connected with [name] on 12/12/2018 at 12:00 PM EDT by Doximity audio conference and verified that I am speaking with the correct person using two identifiers.  Audio conference was used because the video did not work.  This is because she does not have good Internet access. I discussed the limitations, risks, security and privacy concerns of performing an evaluation and management service by Doximity and the availability of in person appointments.  I also discussed with the patient that there may be a patient responsible charge related to this service. The patient expressed understanding and agreed to proceed.  Patient's Location: Home Physician Location: Clinic  CHIEF COMPLIANT: Follow-up on lapatinib with letrozole   INTERVAL HISTORY: Melissa [name] is a 53 y.o. female with above-mentioned history of metastatic breast cancer with brain metastasiss/presection who is currently on letrozole therapy. Brain MRI on 12/06/18 showed interval SRS for recurrent tumor in the right fontal lobe. The were several nodular areas in the right frontal lobe, the largest recommended for follow-up on future scans. There were no new metastatic deposits. She presents today over Doximity for follow-up after her recent MRI and consult with radiation to discuss direction of treatment.     Breast cancer of upper-inner quadrant of left female breast ([place])   06/08/2012 Initial Diagnosis    invasive ductal carcinoma that was ER positive PR positive HER-2/neu positive measuring 3.1 cm by MRI criteria. Ki-67 was 70% HER-2 was amplified with a ratio 2.91    07/12/2012 - 07/17/2013 Neo-Adjuvant Chemotherapy    TCH 6 followed by Herceptin maintenance    12/11/2012 Surgery    Left breast lumpectomy: 1.8 cm tumor 1 positive sentinel node, axillary lymph node dissection 02/08/2013 showed 0/13 lymph nodes    03/25/2013 - 05/06/2013 Radiation Therapy    Adjuvant radiation  therapy    06/05/2013 - 07/20/2017 Anti-estrogen oral therapy    Tamoxifen 20 mg daily    07/27/2017 Relapse/Recurrence    MRI Brain: 3.4 x 2.9 x 2.9 cm RIGHT frontal lobe mass with imaging characteristics of solitary metastasis. Extensive vasogenic edema resulting in 9 mm RIGHT to LEFT midline shift. Equivocal very early LEFT ventricle entrapment.     08/04/2017 Surgery    Rt frontal brain resection: Poorly differentiated tumor IHC suggests breast primary ER and PR Positive    08/25/2017 - 09/04/2017 Radiation Therapy    Stereotactic radiation    09/18/2017 -  Anti-estrogen oral therapy    Lapatinib with letrozole    12/18/2017 - 12/19/2017 Radiation Therapy    New right parietal lobe metastases status post SRS     REVIEW OF SYSTEMS:   Constitutional: Denies fevers, chills or abnormal weight loss Eyes: Denies blurriness of vision Ears, nose, mouth, throat, and face: Denies mucositis or sore throat Respiratory: Denies cough, dyspnea or wheezes Cardiovascular: Denies palpitation, chest discomfort Gastrointestinal:  Denies nausea, heartburn or change in bowel habits Skin: Denies abnormal skin rashes Lymphatics: Denies new lymphadenopathy or easy bruising Neurological:Denies numbness, tingling or new weaknesses Behavioral/Psych: Mood is stable, no new changes  Extremities: No lower extremity edema Breast: denies any pain or lumps or nodules in either breasts All other systems were reviewed with the patient and are negative.  Observations/Objective:  There were no vitals filed for this visit. There is no height or weight on file to calculate BMI.  I have reviewed the data as listed CMP Latest Ref Rng & Units 12/10/2018 11/05/2018 10/04/2018  Glucose 65 -  99 mg/dL 87 87 83  BUN 7 - 25 mg/dL _0 Creatinine 0.50 - 1.05 mg/dL 0.92 0.99 0.91  Sodium 135 - 146 mmol/L 140 141 141  Potassium 3.5 - 5.3 mmol/L 5.6(H) 4.7 5.2  Chloride 98 - 110 mmol/L 106 105 104  CO2 20 - 32 mmol/L _1 Calcium 8.6 - 10.4 mg/dL 10.1 10.2 10.0  Total Protein 6.1 - 8.1 g/dL 7.8 7.5 7.3  Total Bilirubin 0.2 - 1.2 mg/dL 0.3 0.3 0.2  Alkaline Phos 38 - 126 U/L - - -  AST 10 - 35 U/L _2 ALT 6 - 29 U/L _3 Lab Results  Component Value Date   WBC 9.9 12/10/2018   HGB 11.5 (L) 12/10/2018   HCT 35.9 12/10/2018   MCV 89.8 12/10/2018   PLT 491 (H) 12/10/2018   NEUTROABS 6,356 12/10/2018      Assessment Plan:  Breast cancer of upper-inner quadrant of left female breast ([place]) 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 Anti Her 2 therapy with Lapatinibstarted 09/17/2017 3.I discontinuedtamoxifen and started her on letrozole 2.5 mg daily.  Lapatinib toxicities:Currently on 1000 mglapatinibwith Letrozole 2.5 mg Occasional diarrhea.  Stereotactic radiosurgery 12/19/2017 to the new right parietal lobe metastases.  CT CAP: 03/23/18: LUL cavitary arch destortion with ground glass attenuation subpleural LUL (Radiation pneumonitis vs Inf); Inc in size of left mediastinal LN (non specific), LUL nodule 4 mm  Bronchoscopy and biopsy revealed aspergillosis Patient is seeing infectious disease as well as pulmonary with Dr. Melvyn Novas. She completed antifungal therapy and is planning to see Dr. Baxter Flattery.  CT chest 07/25/2018: Prior left upper lobectomy, cylindrical bronchiectasis left lower lobe with thickening and mucous plugging, inflammatory changes are improved. New groundglass nodularity right lung  Mammogram 10/25/2018: No evidence of  malignancy  MRI brain with and without contrast 12/06/2018: Changes related to prior surgery and SRS.  Several nodular areas of enhancement are present in the tumor bed in the right frontal lobe.  1 of these is slightly larger and deserves attention on follow-up.  Patient's infectious disease doctor will be obtaining scans in July. We will use those scans to determine if [place] needs to be changed.. Return to clinic in 2 months for follow-up using a telephone visit because she does not have good Internet connection..    I discussed the assessment and treatment plan with the patient. The patient was provided an opportunity to ask questions and all were answered. The patient agreed with the plan and demonstrated an understanding of the instructions. The patient was advised to call back or seek an in-person evaluation if the symptoms worsen or if the condition fails to improve as anticipated.   I provided 12 minutes of face-to-face Doximity time during this encounter.    Rulon Eisenmenger, MD 12/12/2018   I, Molly Dorshimer, am acting as scribe for Nicholas Lose, MD.  I have reviewed the above documentation for accuracy and completeness, and I agree with the above.

## 2018-12-11 NOTE — Telephone Encounter (Signed)
Contacted pt to verify webex appt for pre reg °

## 2018-12-12 ENCOUNTER — Other Ambulatory Visit: Payer: Self-pay | Admitting: Radiation Therapy

## 2018-12-12 ENCOUNTER — Ambulatory Visit: Payer: 59 | Admitting: Hematology and Oncology

## 2018-12-12 ENCOUNTER — Other Ambulatory Visit: Payer: Self-pay

## 2018-12-12 ENCOUNTER — Inpatient Hospital Stay (HOSPITAL_BASED_OUTPATIENT_CLINIC_OR_DEPARTMENT_OTHER): Payer: 59 | Admitting: Hematology and Oncology

## 2018-12-12 ENCOUNTER — Ambulatory Visit
Admission: RE | Admit: 2018-12-12 | Discharge: 2018-12-12 | Disposition: A | Discharge status: Home / Self Care | Payer: 59 | Source: Ambulatory Visit | Attending: Urology | Admitting: Urology

## 2018-12-12 DIAGNOSIS — Z923 Personal history of irradiation: Secondary | ICD-10-CM | POA: Diagnosis not present

## 2018-12-12 DIAGNOSIS — Z79810 Long term (current) use of selective estrogen receptor modulators (SERMs): Secondary | ICD-10-CM | POA: Diagnosis not present

## 2018-12-12 DIAGNOSIS — C7931 Secondary malignant neoplasm of brain: Secondary | ICD-10-CM

## 2018-12-12 DIAGNOSIS — C50212 Malignant neoplasm of upper-inner quadrant of left female breast: Secondary | ICD-10-CM

## 2018-12-12 DIAGNOSIS — C7949 Secondary malignant neoplasm of other parts of nervous system: Secondary | ICD-10-CM

## 2018-12-12 DIAGNOSIS — Z170 Estrogen receptor positive status [ER+]: Secondary | ICD-10-CM

## 2018-12-12 DIAGNOSIS — Z9221 Personal history of antineoplastic chemotherapy: Secondary | ICD-10-CM

## 2018-12-12 NOTE — Progress Notes (Addendum)
Radiation Oncology         [phone] ________________________________  Name: Melissa [name] MRN: [id number]  Date: 12/12/2018  DOB: [birth year]  Post Treatment Note  CC: Default, Provider, MD  [name], [name], *  Diagnosis:   53 y.o. female with  brain metastasis, ER PR positive, HER-2 positive, from invasive ductal carcinoma of the left breast.  Interval Since Last Radiation:  3 months  09/04/2018:   Brain, Right Frontal, 2 targets / 20 Gy in 1 fraction PTV3: Ant Rt Frontal 18m 20Gy PTV4: Rt Frontal resection cavity 527m 20Gy  12/18/2017:   PTV2: 4 mm Rt Parietal lesion treated to 20 Gy in 1 Fx  08/25/2017, 08/28/2017, 08/30/2017, 09/01/2017, 09/04/2017: PTV1: post op SRS to right frontal lobe resection cavity  Narrative:  I spoke with the patient to conduct her routine scheduled 3 month follow up visit to review MRI brain results via telephone to spare the patient unnecessary potential exposure in the healthcare setting during the current COVID-19 pandemic.  The patient was notified in advance and gave permission to proceed with this visit format.  In summary, she initially presented to the emergency Department on 07/27/2017 with complaints of headaches ongoing for the past 5 weeks with associated nausea and vomiting. She was initially treated by her PCP for sinusitis without improvement.  She also has a history of migraine headaches and [facility] of what she thought was a typical migraine headache given the fact that she had her usual blurry vision and aura preceding the headache. She reports that the headache did improve with treatment but the relief was short-lived as the headache returned the very next day. She followed up with her primary care physician and a CT of the head was going to be scheduled due to the persistent headaches but in the interim, [facility]  severity of the headache with associated nausea and vomiting. The headaches were occuring in the frontal lobe as well as bilateral temporal with occasional radiation to the occipital lobe and associated with blurry vision, decreased appetite, fatigue, nausea and vomiting.  She denied any difficulty with speech, memory, imbalance or focal weakness.  CT Head was performed on admission 07/27/2017 which showed a large right frontal lobe mass measuring 3.1 by 2.5 cm with significant surrounding edema with mass effect and right to left midline shift measuring 8 mm. A subsequent Brain MRI showed a 3.4 x 2.9 x 2.9 cm right frontal lobe mass with imaging characteristics of solitary metastasis and extensive vasogenic edema resulting in 9 mm right to left midline shift as well as equivocal very early left ventricle entrapment.   CT Chest, Abdomen, Pelvis on 07/28/2017 for disease staging showed no findings to suggest metastatic breast cancer involving the chest, abdomen, pelvis or osseous structures. 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 Surgical changes involving the left breast and left axilla but no findings for local recurrence or regional adenopathy.  She proceeded with a gross total resection of the frontal lesion on 08/05/17 with Dr. DiCyndy Freezeollowed by adjuvant SRS radiotherapy to the resection cavity in February 2019. Final surgical pathology revealed poorly differentiated adenocarcinoma consistent with metastatic breast cancer, ER/PR positive and HER-2 positive.   She tolerated her initial SRS treatment well and initial post treatment MRI brain on 12/07/17 showed satisfactory appearance of the right  anterior frontal lobe post treatment site with expected evolution but unfortunately also showed a single, new 2 to 3 mm lesion in the right parietal lobe without associated edema or mass-effect.  She elected to undergo salvage SRS treatment to the new lesion in  the parietal lobe which was completed on 12/18/17 and tolerated well.  A follow up brain MRI on 05/18/18 showed a stable right frontal resection cavity with stable 4 mm nodular enhancement at the inferior cavity margin but no evidence of new disease.  Unfortunately, the follow up MRI brain scan on 08/23/2018 showed a new 11 mm inferior right frontal lesion with only mild associated edema and no mass-effect.  She reported that she had been having more frequent frontal headaches which were not severe and responded to Aleve and otherwise, had been feeling well.  She elected to proceed with a single fraction of SRS to this new brain metastasis as recommended and this was completed on  09/04/18 and tolerated very well.  Interval History:   She remains on 1000 mglapatinibwith Letrozole 2.5 mg for systemic therapy under the care and direction of Dr. Lindi Adie and tolerates this well.  She had bronchoscopy with Dr. Valeta Harms in 05/2018 and dx'ed with Aspergillus- ID following (Dr. Graylon Good).  She also continues in routine follow up with Dr. Melvyn Novas in Pulmonology.  We reviewed her recent follow up MRI brain scan from 12/06/18 which showed several nodular areas of enhancement present in the tumor bed in the right frontal lobe. One of these is slightly larger and deserves attention on follow-up scans but others are smaller and improved.  There are no new metastatic deposits.   On review of systems, the patient states that she is doing well overall.  She is without complaints aside from mild residual fatigue and chronic cough.  She has noticed improved energy levels and has been able to continue to work. She denies headaches, decreased visual or auditory acuity, tinnitus, tremor, focal weakness or seizure activity. She is not having difficulty with speech or word finding.  She denies abdominal pain, N/V or diarrhea.  She remains on Letrozole and immunotherapy with Lapatinib and is tolerating this well. She reports a healthy appetite and  is steadily gaining her weight back which she is pleased with.  ALLERGIES:  is allergic to aspirin; protonix [pantoprazole]; and iodinated diagnostic agents.  Meds: Current Outpatient Medications  Medication Sig Dispense Refill   acetaminophen-codeine (TYLENOL #3) 300-30 MG tablet Take 1 tablet by mouth every 4 (four) hours as needed (cough). 30 tablet 0   albuterol (VENTOLIN HFA) 108 (90 BASE) MCG/ACT inhaler Inhale 2 puffs into the lungs every 6 (six) hours as needed. 1 Inhaler 3   budesonide-formoterol (SYMBICORT) 80-4.5 MCG/ACT inhaler Inhale 2 puffs into the lungs 2 (two) times daily. 1 Inhaler 5   Chlorpheniramine-Codeine 2-10 MG/5ML LIQD Take 5 mLs by mouth every 6 (six) hours as needed. 200 mL 0   Isavuconazonium Sulfate (CRESEMBA) 186 MG CAPS Take 2 capsules (372 mg total) by mouth 3 (three) times daily. X 2 days. Followed by 2 capsules per day thereafter 60 capsule 6   lapatinib (TYKERB) 250 MG tablet Take 4 tablets (1,000 mg total) by mouth daily. Take on an empty stomach, at least 1 hour before or 1 hour after meals. 120 tablet 3   letrozole (FEMARA) 2.5 MG tablet Take 1 tablet (2.5 mg total) by mouth daily. 30 tablet 11   levalbuterol (XOPENEX) 0.63 MG/3ML nebulizer solution Take 3 mLs (0.63 mg  total) by nebulization every 4 (four) hours as needed for wheezing or shortness of breath. 540 mL 6   naproxen sodium (ALEVE) 220 MG tablet Take 440 mg by mouth 2 (two) times daily as needed (headache).      No current facility-administered medications for this encounter.     Physical Findings:  vitals were not taken for this visit.   /Unable to assess due to virtual/telephone visit format.  Lab Findings: Lab Results  Component Value Date   WBC 9.9 12/10/2018   HGB 11.5 (L) 12/10/2018   HCT 35.9 12/10/2018   MCV 89.8 12/10/2018   PLT 491 (H) 12/10/2018     Radiographic Findings: Mr Jeri Cos NI Contrast  Result Date: 12/06/2018 CLINICAL DATA:  Metastatic breast cancer  post surgery and SRS and chemotherapy. EXAM: MRI HEAD WITHOUT AND WITH CONTRAST TECHNIQUE: Multiplanar, multiecho pulse sequences of the brain and surrounding structures were obtained without and with intravenous contrast. CONTRAST:  37m MULTIHANCE GADOBENATE DIMEGLUMINE 529 MG/ML IV SOLN COMPARISON:  MRI head 08/23/2018, 05/18/2018 FINDINGS: Brain: Right frontal craniotomy for tumor resection. Surgical cavity in the right frontal lobe. Dural enhancement in the right frontal lobe due to prior surgery. Interval improvement in enhancing mass right anterior inferior frontal lobe. Currently this measures 5 mm compared with 10.6 mm. Nodular enhancement along the medial wall of the surgical cavity is smaller now measures 3 x 4 mm compared with 5 mm. Nodular enhancement along the superolateral margin of the cavity is slightly larger now measuring 4 mm. Linear enhancement along the anterolateral margin of the cavity is stable No new metastatic deposits in the brain. FLAIR white matter hyperintensity right frontal lobe is stable. Scattered small white matter hyperintensities bilaterally are stable. Ventricle size normal without midline shift Vascular: Normal arterial flow voids Skull and upper cervical spine: Right frontal craniotomy. Negative for skeletal metastatic disease. Sinuses/Orbits: Mild mucosal edema paranasal sinuses.  Normal orbit Other: None IMPRESSION: Right frontal craniotomy for tumor resection. Interval SRS for recurrent tumor in the right frontal lobe. Several nodular areas of enhancement are present in the tumor bed in the right frontal lobe. One of these is slightly larger and deserves attention on follow-up scans. The anterior inferior frontal lobe lesion has been treated and is smaller. No new metastatic deposits. Electronically Signed   By: CFranchot GalloM.D.   On: 12/06/2018 13:48    Impression/Plan: 1. 53y.o. female with  brain metastasis, ER PR positive, HER-2 positive, from invasive ductal  carcinoma of the left breast. [facility] to [facility] well [facility] and is currently without CNS complaints.  We reviewed her recent follow up MRI brain scan from 12/06/18 which showed several nodular areas of enhancement present in the tumor bed in the right frontal lobe. One of these is slightly larger and deserves attention on follow-up scans but others are smaller and improved.  There are no new metastatic deposits. Recommendation from recent multidisciplinary brain tumor board is to repeat an MRI of the brain in 3 months time to assess continue to monitor for any disease progression.  Her case and imaging will continue to be reviewed at the multidisciplinary brain conference prior to a follow-up visit in our office to review results and recommendations.  She knows to call immediately for any new or progressive neurologic symptoms or questions or concerns related to her previous radiotherapy.  She appears to have a good understanding of her disease and our recommendations and is comfortable and  in agreement with the current plan as stated.  Given current concerns for patient exposure during the COVID-19 pandemic, this encounter was conducted via telephone. The patient was notified in advance and was offered a [place] meeting to allow for face to face communication but unfortunately reported that she did not have the appropriate resources/technology to support such a visit and instead preferred to proceed with telephone consult. The patient has given verbal consent for this type of encounter. The time spent during this encounter was 20 minutes with 50% of time spent in coordination of care. The attendants for this meeting include Ramin Zoll PA-C, and patient, Melissa [name]. During the encounter, Miraj Truss PA-C was located at [facility] Radiation Oncology Department.  Patient, Melissa [name] was located at home.    Nicholos Johns, PA-C

## 2018-12-12 NOTE — Assessment & Plan Note (Signed)
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 Anti Her 2 therapy with Lapatinibstarted 09/17/2017 3.I discontinuedtamoxifen and started her on letrozole 2.5 mg daily.  Lapatinib toxicities:Currently on 1000 mglapatinibwith Letrozole 2.5 mg Occasional diarrhea.  Stereotactic radiosurgery 12/19/2017 to the new right parietal lobe metastases.  CT CAP: 03/23/18: LUL cavitary arch destortion with ground glass attenuation subpleural LUL (Radiation pneumonitis vs Inf); Inc in size of left mediastinal LN (non specific), LUL nodule 4 mm  Bronchoscopy and biopsy revealed aspergillosis Patient is seeing infectious disease as well as pulmonary with Dr. Melvyn Novas. She completed antifungal therapy and is planning to see Dr. Baxter Flattery.  CT chest 07/25/2018: Prior left upper lobectomy, cylindrical bronchiectasis left lower lobe with thickening and mucous plugging, inflammatory changes are improved. New groundglass nodularity right lung MRI brain 05/18/2018: Right frontal resection cavity 4 mm Mammogram 10/25/2018: No evidence of malignancy  MRI brain with and without contrast 12/06/2018: Changes related to prior surgery and SRS.  Several nodular areas of enhancement are present in the tumor bed in the right frontal lobe.  1 of these is slightly larger and deserves attention on follow-up.  Patient's infectious disease doctor will be obtaining scans of which we will review. Return to clinic in 3 months for follow-up.

## 2018-12-13 ENCOUNTER — Encounter: Payer: Self-pay | Admitting: Internal Medicine

## 2018-12-13 ENCOUNTER — Telehealth: Payer: Self-pay | Admitting: Hematology and Oncology

## 2018-12-13 ENCOUNTER — Ambulatory Visit (INDEPENDENT_AMBULATORY_CARE_PROVIDER_SITE_OTHER): Payer: 59 | Admitting: Internal Medicine

## 2018-12-13 VITALS — BP 102/64 | HR 95 | Temp 98.2°F | Ht 62.0 in | Wt 110.8 lb

## 2018-12-13 DIAGNOSIS — J984 Other disorders of lung: Secondary | ICD-10-CM

## 2018-12-13 DIAGNOSIS — J479 Bronchiectasis, uncomplicated: Secondary | ICD-10-CM | POA: Diagnosis not present

## 2018-12-13 DIAGNOSIS — F17210 Nicotine dependence, cigarettes, uncomplicated: Secondary | ICD-10-CM

## 2018-12-13 MED ORDER — BUDESONIDE-FORMOTEROL FUMARATE 80-4.5 MCG/ACT IN AERO: 2.0000 | INHALATION_SPRAY | Freq: Two times a day (BID) | RESPIRATORY_TRACT | 0 refills | Status: DC

## 2018-12-13 NOTE — Progress Notes (Signed)
Subjective:     Patient ID: Melissa [name], female   DOB: 1965-09-15 .   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 [facility] in 1997 and 3 years of antifungal rx with fluconazole with residual sob heavy doe and  pain in L shoulder and lower back up to couple times a year lasts up  to a couple  of weeks maybe better with abx, maybe some worse joint pains,  Sometimes some sweats, occ with nasty mucus but no blood referred to pulmonary clinic for recurrent cp 01/2011 with documented Bronchiectasis in 2012    History of Present Illness  01/26/2011 Initial pulmonary office eval cc recurrent pleuritic L ant chest  pain, more severe than usual episodes,  rad neck,  Onset x 2 weeks with abrupt onset,  no better on lodine> stopped.   No sweats or nasty mucus now. Some better at day of ov p rx = dex and zithromax but  some sorethroat since dex with overt hb. rec ct chest POS BRONCHIECTASIS    Nov 2013 Breast Ca > lumpectomy with pos node> RT and chemo Finished Jan 2015     07/15/2016 acute extended ov/Melissa [name] re: ? Bronchiectasis /dulera 100 2 bid maint / still smoking Chief Complaint  Patient presents with  . Acute Visit    lower right back pain x 1 wk- radiating to her right rib area over the past 2 days. She feels discomfort when she takes a deep breath.  She also c/o episodes of feeling light headed. She is not having any SOB and no more cough than usual.    acute onset one week prior to OV  Very Similar pattern/characteristics to prev flares of bronchiectasis related cp on L side but this CP is located R post rad anteriorly / worse with breathing/ worse supine/ moderately severe No sob or need for rescue saba / some better with tylenol  rec levaquin 750 mg one daily x 5 days  For cough / congestion > mucinex dm up to 1200 every 12 hours as needed  Plan A = Automatic = Symbicort 80 1-2 every 12 hours  Work on maintaining perfect   inhaler technique:  Plan B = Backup Only use your albuterol as a rescue medication CTa  07/19/16 neg PE or explanation for R cp c/w mscp from cough     11/25/2016  f/u ov/Melissa [name] re: f/u apparent pna LLL in pt with known bronchiectasis/ cystic lesion LLL  Chief Complaint  Patient presents with  . Follow-up    Pt states dxed with PNA x 3 wks ago.  She states she was told a few days ago that her CXR showed a lung mass and told her that she needed a CT Chest. She states having right side pain and minimal SOB.    cough and cp improved from last ov/ no excess/ purulent sputum or mucus plugs then mid April 2018 abrupt onset  nasal stuffiness and more chest congestion more yellowish then green/chills >  seen in [place]  With dx "L sided pna" rx x levaquin x 10 days > green mucus gone /chills gone >  Back to baseline level of chronic mscp R > L worse  Only with cough and back to nl ex tol   rec The key is to stop smoking completely before smoking completely stops you!      02/27/2017  f/u ov/Melissa [name] re:  Bronchiectasis with  nl pfts on sybm 80 2bid / still smoking  Chief Complaint  Patient presents with  . Follow-up    Breathing is doing well. No new co's today.   cps all gone and Not limited by breathing from desired activities   No need for saba  rec Stop smoking    06/16/2017  f/u ov/Melissa [name] re:  Bronchiectasis s/p valley fever/ symb 80 2bid  Chief Complaint  Patient presents with  . Follow-up    Breathing is overall doing well. She states she has been having some HA and was dxed with sinus infection a few days ago- taking levaquin.    Ha  frontal and bilateral and worse in am and better on levquin/medrol  rec  Call if not better after you finish your antibiotics The key is to stop smoking completely before smoking completely stops you!    Breast ca mets >>> Brain surgery Jan 2019 then RT Feb 2019 finished taking dex around late Feb 2019         04/11/2018  f/u ov/Melissa [name] re: f/u ? infected  bleb  Chief Complaint  Patient presents with  . Follow-up    shortness of breath, productive cough, headahce.    Dyspnea:  Outside walking / ok shopping  Cough: better/ minimal yellow no blood Sleeping: no resp c/o L side down flat bed 2 pillows  SABA use: only if goes out in heat 02: none    Minimal L ax pain with really deep breath or hard cough  "90% improved from last ov"  rec Add:  augmentin x 10 more days done       Diflucan rx 04/26/18 per ID    05/02/2018  f/u ov/Melissa [name] re:  "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 Follow-up    Her left side and pain and SOB have been progressively getting worse. She has been using her albuterol inhaler 2 x daily on average and neb once every night.   Dyspnea:  X 100 ft has to stop (see doe, not reproducible in office)  Cough: mostly clear now, does not flare overnight or in am Sleeping: R side down p tylenol #3 does fine/ no fever chills sweats or wt loss SABA use: once or twice daily  Main concern is pain under L axillary area that prev reported "90%" improved now says that wasn't true and is "no better" but rarely taking tyl #3 as rec as "I have to work"  - aleve no help but takes anyway Rec: FOB DR Ikard  05/24/19  > dx ? 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 Follow-up    Patient states breathing is harder during hot/humid weather. using rescue inhaler twice a week on average.  Dyspnea:  Able to garden  / steps are a problem = MMRC1 = can walk nl pace, flat grade, can't hurry or go uphills or steps s sob   Cough: congested esp in am x few hours each am  just clear thick Sleeping: on side/ bed is flat/ 2 pillows SABA use: as above, seems to help 02: none   No obvious day to day or  daytime variability or assoc excess/ purulent sputum or mucus plugs or hemoptysis or  cp or chest tightness, subjective wheeze or overt sinus or hb symptoms.   Sleeping  without nocturnal  or early am exacerbation  of respiratory  c/o's or need for noct saba. Also denies any obvious fluctuation of symptoms with weather or environmental changes or other aggravating or alleviating factors except as outlined above   No unusual exposure hx or h/o childhood pna/ asthma or knowledge of premature birth.  Current Allergies, Complete Past Medical History, Past Surgical History, Family History, and Social History were reviewed in [employer] record.  ROS  The following are not active complaints unless bolded Hoarseness, sore throat, dysphagia, dental problems, itching, sneezing,  nasal congestion or discharge of excess mucus or purulent secretions, ear ache,   fever, chills, sweats, unintended wt loss or wt gain, classically pleuritic or exertional cp,  orthopnea pnd or arm/hand swelling  or leg swelling, presyncope, palpitations, abdominal pain, anorexia, nausea, vomiting, diarrhea  or change in bowel habits or change in bladder habits, change in stools or change in urine, dysuria, hematuria,  rash, arthralgias, visual complaints, headache, numbness, weakness or ataxia or problems with walking or coordination,  change in mood or  memory.        Current Meds  Medication Sig  . albuterol (VENTOLIN HFA) 108 (90 BASE) MCG/ACT inhaler Inhale 2 puffs into the lungs every 6 (six) hours as needed.  . budesonide-formoterol (SYMBICORT) 80-4.5 MCG/ACT inhaler Inhale 2 puffs into the lungs 2 (two) times daily.  . Chlorpheniramine-Codeine 2-10 MG/5ML LIQD Take 5 mLs by mouth every 6 (six) hours as needed.  . Isavuconazonium Sulfate (CRESEMBA) 186 MG CAPS Take 2 capsules (372 mg total) by mouth 3 (three) times daily. X 2 days. Followed by 2 capsules per day thereafter  . lapatinib (TYKERB) 250 MG  tablet Take 4 tablets (1,000 mg total) by mouth daily. Take on an empty stomach, at least 1 hour before or 1 hour after meals.  Marland Kitchen letrozole (FEMARA) 2.5 MG tablet Take 1 tablet (2.5 mg total) by mouth daily.  Marland Kitchen levalbuterol (XOPENEX) 0.63 MG/3ML nebulizer solution Take 3 mLs (0.63 mg total) by nebulization every 4 (four) hours as needed for wheezing or shortness of breath.  . naproxen sodium (ALEVE) 220 MG tablet Take 440 mg by mouth 2 (two) times daily as needed (headache).                      Objective:   Physical Exam  amb wf nad with minimally congested sounding cough  Vital signs reviewed - Note on arrival 02 sats  95% on RA       Wt 118  02/10/11 >  05/03/2011  118 > 117 03/23/2012 >  122 07/30/2012 > 09/29/2014 121> 06/13/2016   119 > 07/15/2016 118 > 08/09/2016  118  > 11/25/2016   114 > 02/27/2017   116 > 06/16/2017  119 >  11/22/2017 112 >  02/28/2018 110 > 03/27/2018   108 > 04/11/2018  110 > 05/02/2018   109 >  08/07/2018  111 > 12/13/2018  110     HEENT: nl dentition / oropharynx. Nl external ear canals without cough reflex -  Mild bilateral non-specific turbinate edema     NECK :  without JVD/Nodes/TM/ nl carotid upstrokes bilaterally   LUNGS: no acc muscle use,  Mild barrel  contour chest wall with bilateral insp squeaks and exp rhonchi L >R lower lobe  without cough on insp or exp maneuver and mild  Hyperresonant  to  percussion bilaterally     CV:  RRR  no s3 or murmur or increase in P2, and no edema   ABD:  soft and nontender with pos late pos insp Hoover's  in the supine position. No bruits or organomegaly appreciated, bowel sounds nl  MS:   Nl gait/  ext warm without deformities, calf tenderness, cyanosis or clubbing No obvious joint restrictions   SKIN: warm and dry without lesions    NEURO:  alert, approp, nl sensorium with  no motor or cerebellar deficits apparent.          I personally reviewed images and agree with radiology impression as follows:  CXR:   08/24/18   Chronic changes in the left lower lobe. These findings are similar since November 2019. No acute interval changes     Assessment:

## 2018-12-13 NOTE — Patient Instructions (Signed)
The key is to stop smoking completely before smoking completely stops you! - For smoking cessation classes/info call [phone]      Work on inhaler technique:  relax and gently blow all the way out then take a nice smooth deep breath back in, triggering the inhaler at same time you start breathing in.  Hold for up to 5 seconds if you can. Blow out thru nose. Rinse and gargle with water when done    If you are satisfied with your treatment plan,  let your doctor know and he/she can either refill your medications or you can return here when your prescription runs out.     If in any way you are not 100% satisfied,  please tell us.  If 100% better, tell your friends!  Pulmonary follow up is as needed

## 2018-12-13 NOTE — Telephone Encounter (Signed)
Could not reach mailbox was full

## 2018-12-14 ENCOUNTER — Telehealth: Payer: Self-pay

## 2018-12-15 ENCOUNTER — Encounter: Payer: Self-pay | Admitting: Internal Medicine

## 2018-12-15 NOTE — Assessment & Plan Note (Signed)
Status post left upper lobectomy for ? [place] CT chest   01/26/2011 Focal bronchiectasis / scarring medially in the left lung apex with  surrounding inflammatory changes  - Pneumovax 08/12/11  And prevnar 08/09/2016  - PFT's  08/09/2016  FEV1 2.05 (77 % ) ratio 70  p 5 % improvement from saba p dulera 100 x2  prior to study with DLCO  46 % corrects to 55  % for alv volume   - alpha one screening 03/27/2018  MM 267  - FOB/BAL 05/23/18 Pos Aspergillus > ID following  -  12/13/2018  After extensive coaching inhaler device,  effectiveness =    75% (short ti)    Continuing low dose symbicort as do not want to suppress her immune response given low grade fungal infection likely but needs for airway clearance > continue 80 2bid

## 2018-12-15 NOTE — Assessment & Plan Note (Signed)
Counseled re importance of smoking cessation but did not meet time criteria for separate billing     I had an extended discussion with the patient reviewing all relevant studies completed to date and  lasting 15 to 20 minutes of a 25 minute visit    See device teaching which extended face to face time for this visit.  Each maintenance medication was reviewed in detail including emphasizing most importantly the difference between maintenance and prns and under what circumstances the prns are to be triggered using an action plan format that is not reflected in the computer generated alphabetically organized AVS which I have not found useful in most complex patients, especially with respiratory illnesses  Please see AVS for specific instructions unique to this visit that I personally wrote and verbalized to the the pt in detail and then reviewed with pt  by my nurse highlighting any  changes in therapy recommended at today's visit to their plan of care.       

## 2018-12-15 NOTE — Assessment & Plan Note (Signed)
New since CT 01/18/18  assoc with acute onset L axillary cp 03/28/18 in area of previous blebs s a/f level c/w infected bleb rx augmentin x 10 days > improved at ov 04/11/2018 and 10 more days augmentin given - Quant TB 03/27/2018 neg  - ID eval 04/09/18 fungal serologies sent > neg  - 04/11/2018 rec another 10 days augmentin for ? Abscess forming LUL  - Following with ID ? Start fluconazole if not improving. Req records be sent to MD who treated valley fever in '97   - CT 04/26/18 > Overall, there has been interval worsening in lung disease on the Left  - FOB Dr Windell [place] 05/24/19 with BAL pos for aspergillus > see ID rx    Bronchiectasis symptoms have been clearly better controlled on rx per ID, defer all rx to Dr Graylon Good as well as f/u cxr's

## 2018-12-17 ENCOUNTER — Other Ambulatory Visit: Payer: Self-pay | Admitting: Hematology and Oncology

## 2018-12-17 DIAGNOSIS — C50212 Malignant neoplasm of upper-inner quadrant of left female breast: Secondary | ICD-10-CM

## 2018-12-19 ENCOUNTER — Telehealth: Payer: Self-pay

## 2018-12-19 NOTE — Telephone Encounter (Signed)
Patient left voicemail requesting a call back from Dr. Baxter Flattery regarding a letter for work. Patient states she needs the letter as soon as possible. Will route message to MD.  Melissa [name], [place]

## 2018-12-20 MED FILL — CRESEMBA 186 MG CAPSULE: 186 | 28 days supply | Qty: 56 | Fill #5

## 2018-12-20 MED FILL — TYKERB 250 MG TABLET: 250 | 30 days supply | Qty: 120 | Fill #0

## 2018-12-25 NOTE — Telephone Encounter (Signed)
Patient requesting letter for attorney due to unlawful employer termination. Message sent via [place]. Routing to MD.

## 2018-12-28 ENCOUNTER — Other Ambulatory Visit: Payer: Self-pay | Admitting: Internal Medicine

## 2019-01-01 ENCOUNTER — Encounter: Payer: Self-pay | Admitting: Urology

## 2019-01-07 ENCOUNTER — Telehealth: Payer: Self-pay | Admitting: *Deleted

## 2019-01-07 NOTE — Telephone Encounter (Signed)
Patient is calling for an update on the status on the letter for her attorney as previously discussed with Dr Baxter Flattery.  Her attorney is Alexis Goodell phone: [phone], fax: [phone] She also had a qestion about the status of the CT she states Dr Baxter Flattery was going to order.  Please advise.  Landis Gandy, RN

## 2019-01-15 ENCOUNTER — Telehealth: Payer: Self-pay | Admitting: Radiation Oncology

## 2019-01-15 NOTE — Telephone Encounter (Signed)
Completed [employer] form to the best of my ability. Placed in Shona Simpson, PA-C  inbox to review and sign as requested by Freeman Caldron, PA-C

## 2019-01-17 MED FILL — TYKERB 250 MG TABLET: 250 | 30 days supply | Qty: 120 | Fill #1

## 2019-01-18 NOTE — Telephone Encounter (Signed)
Faxed signed DISABILITY CLAIM FORM to [phone]. Fax confirmation of delivery obtained. Paperwork placed in [place] to scan.

## 2019-01-21 NOTE — Progress Notes (Signed)
Error

## 2019-01-21 NOTE — Telephone Encounter (Signed)
Patient called to ask about the letter she has been waiting on. Advised her will call the provider and give her a call back.  Provider is out of the office for the next week but she will try to get the letter done by tomorrow 01/22/19 and will let me know.   Will call the patient to let her know that as soon as I get the letter I will give her a call.

## 2019-01-24 ENCOUNTER — Other Ambulatory Visit: Payer: Self-pay | Admitting: *Deleted

## 2019-01-24 DIAGNOSIS — J471 Bronchiectasis with (acute) exacerbation: Secondary | ICD-10-CM

## 2019-01-24 NOTE — Telephone Encounter (Signed)
error 

## 2019-01-30 ENCOUNTER — Other Ambulatory Visit: Payer: Self-pay | Admitting: *Deleted

## 2019-01-30 MED ORDER — PREDNISONE 50 MG PO TABS: ORAL_TABLET | ORAL | 0 refills | Status: DC

## 2019-01-31 ENCOUNTER — Ambulatory Visit (HOSPITAL_COMMUNITY)
Admission: RE | Admit: 2019-01-31 | Discharge: 2019-01-31 | Disposition: A | Discharge status: Home / Self Care | Payer: BC Managed Care – PPO | Source: Ambulatory Visit | Attending: Hematology and Oncology | Admitting: Hematology and Oncology

## 2019-01-31 ENCOUNTER — Other Ambulatory Visit: Payer: Self-pay

## 2019-01-31 DIAGNOSIS — J471 Bronchiectasis with (acute) exacerbation: Secondary | ICD-10-CM | POA: Insufficient documentation

## 2019-01-31 MED ORDER — SODIUM CHLORIDE (PF) 0.9 % IJ SOLN
INTRAMUSCULAR | Status: AC
  Filled 2019-01-31: qty 50

## 2019-01-31 MED ORDER — IOHEXOL 300 MG/ML  SOLN
75.0000 mL | Freq: Once | INTRAMUSCULAR | Status: AC | PRN
  Administered 2019-01-31: 12:00:00 75 mL via INTRAVENOUS

## 2019-02-04 ENCOUNTER — Encounter: Payer: Self-pay | Admitting: Urology

## 2019-02-04 NOTE — Assessment & Plan Note (Signed)
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 Anti Her 2 therapy with Lapatinibstarted 09/17/2017 3.I discontinuedtamoxifen and started her on letrozole 2.5 mg daily.  Lapatinib toxicities:Currently on 1000 mglapatinibwith Letrozole 2.5 mg Occasional diarrhea.  Stereotactic radiosurgery 12/19/2017 to the new right parietal lobe metastases.  CT CAP: 03/23/18: LUL cavitary arch destortion with ground glass attenuation subpleural LUL (Radiation pneumonitis vs Inf); Inc in size of left mediastinal LN (non specific), LUL nodule 4 mm  Bronchoscopy and biopsy revealed aspergillosis Patient is seeing infectious disease as well as pulmonary with Dr. Melvyn Novas. She completed antifungal therapy and is planning to see Dr. Baxter Flattery.  CT chest 07/25/2018: Prior left upper lobectomy, cylindrical bronchiectasis left lower lobe with thickening and mucous plugging, inflammatory changes are improved. New groundglass nodularity right lung  Mammogram 10/25/2018: No evidence of malignancy  MRI brain scheduled in August 2020 CT chest 01/31/2019: Chronic changes of bronchiectasis redemonstrated without findings of infection.  Reactive anterior mediastinal lymphadenopathy.  Continue with current therapy with lapatinib. Return to clinic in 3 months with labs and follow-up

## 2019-02-05 ENCOUNTER — Encounter: Payer: Self-pay | Admitting: Internal Medicine

## 2019-02-05 NOTE — Telephone Encounter (Signed)
Patient called and advised the requested letter has been faxed to her attorney Alexis Goodell [phone].

## 2019-02-08 NOTE — Progress Notes (Signed)
Patient Care Team: Patient, [name] as PCP - General (General Practice) [name], MD as Referring Physician (Emergency Medicine)  DIAGNOSIS:    ICD-10-CM   1. Malignant neoplasm of upper-inner quadrant of left breast in female, estrogen receptor positive ([place])  C50.212    Z17.0     SUMMARY OF ONCOLOGIC HISTORY: Oncology History  Breast cancer of upper-inner quadrant of left female breast ([place])  06/08/2012 Initial Diagnosis   invasive ductal carcinoma that was ER positive PR positive HER-2/neu positive measuring 3.1 cm by MRI criteria. Ki-67 was 70% HER-2 was amplified with a ratio 2.91   07/12/2012 - 07/17/2013 Neo-Adjuvant Chemotherapy   TCH 6 followed by Herceptin maintenance   12/11/2012 Surgery   Left breast lumpectomy: 1.8 cm tumor 1 positive sentinel node, axillary lymph node dissection 02/08/2013 showed 0/13 lymph nodes   03/25/2013 - 05/06/2013 Radiation Therapy   Adjuvant radiation therapy   06/05/2013 - 07/20/2017 Anti-estrogen oral therapy   Tamoxifen 20 mg daily   07/27/2017 Relapse/Recurrence   MRI Brain: 3.4 x 2.9 x 2.9 cm RIGHT frontal lobe mass with imaging characteristics of solitary metastasis. Extensive vasogenic edema resulting in 9 mm RIGHT to LEFT midline shift. Equivocal very early LEFT ventricle entrapment.    08/04/2017 Surgery   Rt frontal brain resection: Poorly differentiated tumor IHC suggests breast primary ER and PR Positive   08/25/2017 - 09/04/2017 Radiation Therapy   Stereotactic radiation   09/18/2017 -  Anti-estrogen oral therapy   Lapatinib with letrozole   12/18/2017 - 12/19/2017 Radiation Therapy   New right parietal lobe metastases status post SRS     CHIEF COMPLIANT: Follow-up of metastatic breast cancer on lapatinib with letrozole  INTERVAL HISTORY: Melissa [name] is a 53 y.o. with above-mentioned history of metastatic breast cancer with brain metastasiss/presection who is currently on lapatinib with letrozole. Chest CT on  01/31/19 revealed chronic changes of bronchiectasis without definite acute findings to suggest acute infection and reactive anterior mediastinal lymphadenopathy. She presents to the clinic today to review recent scans and for a toxicity check.   REVIEW OF SYSTEMS:   Constitutional: Denies fevers, chills or abnormal weight loss Eyes: Denies blurriness of vision Ears, nose, mouth, throat, and face: Denies mucositis or sore throat Respiratory: Denies cough, dyspnea or wheezes Cardiovascular: Denies palpitation, chest discomfort Gastrointestinal: Denies nausea, heartburn or change in bowel habits Skin: Denies abnormal skin rashes Lymphatics: Denies new lymphadenopathy or easy bruising Neurological: Denies numbness, tingling or new weaknesses Behavioral/Psych: Mood is stable, no new changes  Extremities: No lower extremity edema Breast: denies any pain or lumps or nodules in either breasts All other systems were reviewed with the patient and are negative.  I have reviewed the past medical history, past surgical history, social history and family history with the patient and they are unchanged from previous note.  ALLERGIES:  is allergic to aspirin; protonix [pantoprazole]; and iodinated diagnostic agents.  MEDICATIONS:  Current Outpatient Medications  Medication Sig Dispense Refill  . albuterol (VENTOLIN HFA) 108 (90 BASE) MCG/ACT inhaler Inhale 2 puffs into the lungs every 6 (six) hours as needed. 1 Inhaler 3  . budesonide-formoterol (SYMBICORT) 80-4.5 MCG/ACT inhaler Inhale 2 puffs into the lungs 2 (two) times daily. 1 Inhaler 5  . budesonide-formoterol (SYMBICORT) 80-4.5 MCG/ACT inhaler Inhale 2 puffs into the lungs 2 (two) times a day. 1 Inhaler 0  . Chlorpheniramine-Codeine 2-10 MG/5ML LIQD Take 5 mLs by mouth every 6 (six) hours as needed. 200 mL 0  . Isavuconazonium  Sulfate (CRESEMBA) 186 MG CAPS Take 2 capsules (372 mg total) by mouth 3 (three) times daily. X 2 days. Followed by 2  capsules per day thereafter 60 capsule 6  . letrozole (FEMARA) 2.5 MG tablet Take 1 tablet (2.5 mg total) by mouth daily. 30 tablet 11  . levalbuterol (XOPENEX) 0.63 MG/3ML nebulizer solution Take 3 mLs (0.63 mg total) by nebulization every 4 (four) hours as needed for wheezing or shortness of breath. 540 mL 6  . naproxen sodium (ALEVE) 220 MG tablet Take 440 mg by mouth 2 (two) times daily as needed (headache).     . predniSONE (DELTASONE) 50 MG tablet Take for contrast allergy prior to study. Take one tablet 13 hours before study, one tablet 7 hours before study,and one tablet 1 hour before study 30 tablet 0  . TYKERB 250 MG tablet TAKE 4 TABLETS (1,000 MG TOTAL) BY MOUTH DAILY. TAKE ON AN EMPTY STOMACH, AT LEAST 1 HOUR BEFORE OR 1 HOUR AFTER MEALS. 120 tablet 3   No current facility-administered medications for this visit.     PHYSICAL EXAMINATION: ECOG PERFORMANCE STATUS: 1 - Symptomatic but completely ambulatory  Vitals:   02/11/19 1107  BP: 109/66  Pulse: 85  Resp: (!) 86  Temp: 99.1 F (37.3 C)  SpO2: 100%   Filed Weights   02/11/19 1107  Weight: 111 lb 8 oz (50.6 kg)    GENERAL: alert, no distress and comfortable SKIN: skin color, texture, turgor are normal, no rashes or significant lesions EYES: normal, Conjunctiva are pink and non-injected, sclera clear OROPHARYNX: no exudate, no erythema and lips, buccal mucosa, and tongue normal  NECK: supple, thyroid normal size, non-tender, without nodularity LYMPH: no palpable lymphadenopathy in the cervical, axillary or inguinal LUNGS: clear to auscultation and percussion with normal breathing effort HEART: regular rate & rhythm and no murmurs and no lower extremity edema ABDOMEN: abdomen soft, non-tender and normal bowel sounds MUSCULOSKELETAL: no cyanosis of digits and no clubbing  NEURO: alert & oriented x 3 with fluent speech, no focal motor/sensory deficits EXTREMITIES: No lower extremity edema  LABORATORY DATA:  I have  reviewed the data as listed CMP Latest Ref Rng & Units 12/10/2018 11/05/2018 10/04/2018  Glucose 65 - 99 mg/dL 87 87 83  BUN 7 - 25 mg/dL _0 Creatinine 0.50 - 1.05 mg/dL 0.92 0.99 0.91  Sodium 135 - 146 mmol/L 140 141 141  Potassium 3.5 - 5.3 mmol/L 5.6(H) 4.7 5.2  Chloride 98 - 110 mmol/L 106 105 104  CO2 20 - 32 mmol/L _1 Calcium 8.6 - 10.4 mg/dL 10.1 10.2 10.0  Total Protein 6.1 - 8.1 g/dL 7.8 7.5 7.3  Total Bilirubin 0.2 - 1.2 mg/dL 0.3 0.3 0.2  Alkaline Phos 38 - 126 U/L - - -  AST 10 - 35 U/L _2 ALT 6 - 29 U/L _3 Lab Results  Component Value Date   WBC 9.9 12/10/2018   HGB 11.5 (L) 12/10/2018   HCT 35.9 12/10/2018   MCV 89.8 12/10/2018   PLT 491 (H) 12/10/2018   NEUTROABS 6,356 12/10/2018    ASSESSMENT & PLAN:  Breast cancer of upper-inner quadrant of left female breast ([facility]) 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 Anti Her 2 therapy with Lapatinibstarted 09/17/2017 3.I discontinuedtamoxifen and started her on letrozole 2.5 mg daily.  Lapatinib toxicities:Currently on 1000 mglapatinibwith Letrozole 2.5 mg Occasional diarrhea.  Stereotactic radiosurgery 12/19/2017 to the new right parietal lobe metastases.  Bronchoscopy and biopsy revealed aspergillosis Patient is seeing infectious disease as well as pulmonary with Dr. Melvyn Novas. She completed antifungal therapy with Dr. Baxter Flattery.  Mammogram 10/25/2018: No evidence of malignancy  MRI brain scheduled in August 2020 CT chest 01/31/2019: Chronic changes  of bronchiectasis redemonstrated without findings of infection.  Reactive anterior mediastinal lymphadenopathy. Brain MRI is set for 03/08/2019  Continue with current therapy with lapatinib. Return to clinic in 3 months with labs and follow-up   No orders of the defined types were placed in this encounter.  The patient has a good understanding of the overall plan. she agrees with it. she will call with any problems that may develop before the next visit here.  Nicholas Lose, MD 02/11/2019  Julious Oka Dorshimer am acting as scribe for Dr. Nicholas Lose.  I have reviewed the above documentation for accuracy and completeness, and I agree with the above.

## 2019-02-11 ENCOUNTER — Inpatient Hospital Stay: Payer: BC Managed Care – PPO | Attending: Medical | Admitting: Hematology and Oncology

## 2019-02-11 ENCOUNTER — Other Ambulatory Visit: Payer: Self-pay

## 2019-02-11 ENCOUNTER — Telehealth: Payer: Self-pay | Admitting: Radiation Oncology

## 2019-02-11 DIAGNOSIS — C50212 Malignant neoplasm of upper-inner quadrant of left female breast: Secondary | ICD-10-CM

## 2019-02-11 DIAGNOSIS — C773 Secondary and unspecified malignant neoplasm of axilla and upper limb lymph nodes: Secondary | ICD-10-CM | POA: Insufficient documentation

## 2019-02-11 DIAGNOSIS — Z170 Estrogen receptor positive status [ER+]: Secondary | ICD-10-CM | POA: Diagnosis not present

## 2019-02-11 DIAGNOSIS — C7931 Secondary malignant neoplasm of brain: Secondary | ICD-10-CM | POA: Insufficient documentation

## 2019-02-11 DIAGNOSIS — J479 Bronchiectasis, uncomplicated: Secondary | ICD-10-CM | POA: Insufficient documentation

## 2019-02-11 NOTE — Telephone Encounter (Signed)
Mailed disability claims form to patient's home.

## 2019-02-12 ENCOUNTER — Telehealth: Payer: Self-pay | Admitting: Hematology and Oncology

## 2019-02-12 NOTE — Telephone Encounter (Signed)
I talk with patient regarding schedule  

## 2019-02-18 MED FILL — TYKERB 250 MG TABLET: 250 | 30 days supply | Qty: 120 | Fill #2

## 2019-03-04 ENCOUNTER — Other Ambulatory Visit: Payer: Self-pay | Admitting: Internal Medicine

## 2019-03-04 MED ORDER — BUDESONIDE-FORMOTEROL FUMARATE 80-4.5 MCG/ACT IN AERO: 2.0000 | INHALATION_SPRAY | Freq: Two times a day (BID) | RESPIRATORY_TRACT | 5 refills | Status: DC

## 2019-03-05 ENCOUNTER — Other Ambulatory Visit: Payer: Self-pay | Admitting: Radiation Therapy

## 2019-03-05 ENCOUNTER — Other Ambulatory Visit: Payer: Self-pay | Admitting: Internal Medicine

## 2019-03-06 ENCOUNTER — Other Ambulatory Visit: Payer: Self-pay | Admitting: *Deleted

## 2019-03-06 DIAGNOSIS — B4489 Other forms of aspergillosis: Secondary | ICD-10-CM

## 2019-03-06 MED ORDER — CHLORPHENIRAMINE-CODEINE 2-10 MG/5ML PO LIQD: 5.0000 mL | Freq: Four times a day (QID) | ORAL | 1 refills | Status: DC | PRN

## 2019-03-06 NOTE — Telephone Encounter (Signed)
Patient is calling to advise she needs a refill on this cough medication as it is the only thing that stops her sough so she can sleep. She also had her scan done in July and is waiting on next steps from Dr Baxter Flattery. Advised her will send a message and get back to her as soon as possible.

## 2019-03-08 ENCOUNTER — Other Ambulatory Visit: Payer: Self-pay

## 2019-03-08 ENCOUNTER — Ambulatory Visit
Admission: RE | Admit: 2019-03-08 | Discharge: 2019-03-08 | Disposition: A | Discharge status: Home / Self Care | Payer: BC Managed Care – PPO | Source: Ambulatory Visit | Attending: Radiation Oncology | Admitting: Radiation Oncology

## 2019-03-08 DIAGNOSIS — C7931 Secondary malignant neoplasm of brain: Secondary | ICD-10-CM

## 2019-03-08 MED ORDER — GADOBENATE DIMEGLUMINE 529 MG/ML IV SOLN
10.0000 mL | Freq: Once | INTRAVENOUS | Status: AC | PRN
  Administered 2019-03-08: 10 mL via INTRAVENOUS

## 2019-03-11 ENCOUNTER — Other Ambulatory Visit: Payer: Self-pay

## 2019-03-11 ENCOUNTER — Ambulatory Visit (INDEPENDENT_AMBULATORY_CARE_PROVIDER_SITE_OTHER): Payer: BC Managed Care – PPO | Admitting: Internal Medicine

## 2019-03-11 ENCOUNTER — Telehealth: Payer: Self-pay | Admitting: *Deleted

## 2019-03-11 ENCOUNTER — Inpatient Hospital Stay: Payer: BC Managed Care – PPO

## 2019-03-11 ENCOUNTER — Encounter: Payer: Self-pay | Admitting: Internal Medicine

## 2019-03-11 VITALS — BP 99/66 | HR 76 | Temp 98.3°F

## 2019-03-11 DIAGNOSIS — J471 Bronchiectasis with (acute) exacerbation: Secondary | ICD-10-CM | POA: Diagnosis not present

## 2019-03-11 DIAGNOSIS — J984 Other disorders of lung: Secondary | ICD-10-CM

## 2019-03-11 LAB — COMPLETE METABOLIC PANEL WITH GFR
AG Ratio: 1.1 (calc) (ref 1.0–2.5)
ALT: 12 U/L (ref 6–29)
AST: 14 U/L (ref 10–35)
Albumin: 3.9 g/dL (ref 3.6–5.1)
Alkaline phosphatase (APISO): 104 U/L (ref 37–153)
BUN: 8 mg/dL (ref 7–25)
CO2: 28 mmol/L (ref 20–32)
Calcium: 9.8 mg/dL (ref 8.6–10.4)
Chloride: 104 mmol/L (ref 98–110)
Creat: 0.88 mg/dL (ref 0.50–1.05)
GFR, Est African American: 88 mL/min/{1.73_m2} (ref >=60)
GFR, Est Non African American: 76 mL/min/{1.73_m2} (ref >=60)
Globulin: 3.4 g/dL (calc) (ref 1.9–3.7)
Glucose, Bld: 87 mg/dL (ref 65–99)
Potassium: 4.7 mmol/L (ref 3.5–5.3)
Sodium: 140 mmol/L (ref 135–146)
Total Bilirubin: 0.3 mg/dL (ref 0.2–1.2)
Total Protein: 7.3 g/dL (ref 6.1–8.1)

## 2019-03-11 LAB — CBC WITH DIFFERENTIAL/PLATELET
Absolute Monocytes: 984 {cells}/uL — ABNORMAL HIGH (ref 200–950)
Basophils Absolute: 80 {cells}/uL (ref 0–200)
Basophils Relative: 0.6 %
Eosinophils Absolute: 1410 {cells}/uL — ABNORMAL HIGH (ref 15–500)
Eosinophils Relative: 10.6 %
HCT: 30.4 % — ABNORMAL LOW (ref 35.0–45.0)
Hemoglobin: 9.7 g/dL — ABNORMAL LOW (ref 11.7–15.5)
Lymphs Abs: 1902 {cells}/uL (ref 850–3900)
MCH: 28.2 pg (ref 27.0–33.0)
MCHC: 31.9 g/dL — ABNORMAL LOW (ref 32.0–36.0)
MCV: 88.4 fL (ref 80.0–100.0)
MPV: 8.9 fL (ref 7.5–12.5)
Monocytes Relative: 7.4 %
Neutro Abs: 8924 {cells}/uL — ABNORMAL HIGH (ref 1500–7800)
Neutrophils Relative %: 67.1 %
Platelets: 488 Thousand/uL — ABNORMAL HIGH (ref 140–400)
RBC: 3.44 Million/uL — ABNORMAL LOW (ref 3.80–5.10)
RDW: 15.7 % — ABNORMAL HIGH (ref 11.0–15.0)
Total Lymphocyte: 14.3 %
WBC: 13.3 Thousand/uL — ABNORMAL HIGH (ref 3.8–10.8)

## 2019-03-11 MED ORDER — HYDROCODONE-GUAIFENESIN 2.5-200 MG/5ML PO SOLN: 5.0000 mL | Freq: Three times a day (TID) | ORAL | 0 refills | Status: DC

## 2019-03-11 MED ORDER — AMOXICILLIN-POT CLAVULANATE 875-125 MG PO TABS: 1.0000 | ORAL_TABLET | Freq: Two times a day (BID) | ORAL | 0 refills | Status: DC

## 2019-03-11 NOTE — Telephone Encounter (Signed)
Patient called and advised the cough medication the provider called in is not working. She states that the bottles are the same but it is not working and she is coughing and can not sleep. Advised we called in exactly what was in the chart. She ask if we could call in something stronger or something else. Advised her will send the provider a message and call her back once she responds.

## 2019-03-11 NOTE — Progress Notes (Signed)
Patient ID: Melissa [name], female   DOB: [birth year], 53 y.o.   MRN: [id number]  HPI 53 year old female with history of bronchiectasis with suspected acute exacerbation. Has metastatic breast ca, being treated for aspergillosis with cresemba, hx of pulmonary coccidiomycosis s/p  left upper lobectomy when she lived in [place]  She now has had productive cough with Left side rib cage pain, associated with cough. No fever, chills nightsweats. Current cough medicine does not appear to be improving  Outpatient Encounter Medications as of 03/11/2019  Medication Sig  . albuterol (VENTOLIN HFA) 108 (90 BASE) MCG/ACT inhaler Inhale 2 puffs into the lungs every 6 (six) hours as needed.  . budesonide-formoterol (SYMBICORT) 80-4.5 MCG/ACT inhaler Inhale 2 puffs into the lungs 2 (two) times daily.  . budesonide-formoterol (SYMBICORT) 80-4.5 MCG/ACT inhaler Inhale 2 puffs into the lungs 2 (two) times a day.  . budesonide-formoterol (SYMBICORT) 80-4.5 MCG/ACT inhaler Inhale 2 puffs into the lungs 2 (two) times daily.  . Chlorpheniramine-Codeine 2-10 MG/5ML LIQD Take 5 mLs by mouth every 6 (six) hours as needed.  Marland Kitchen letrozole (FEMARA) 2.5 MG tablet Take 1 tablet (2.5 mg total) by mouth daily.  Marland Kitchen levalbuterol (XOPENEX) 0.63 MG/3ML nebulizer solution Take 3 mLs (0.63 mg total) by nebulization every 4 (four) hours as needed for wheezing or shortness of breath.  . naproxen sodium (ALEVE) 220 MG tablet Take 440 mg by mouth 2 (two) times daily as needed (headache).   . TYKERB 250 MG tablet TAKE 4 TABLETS (1,000 MG TOTAL) BY MOUTH DAILY. TAKE ON AN EMPTY STOMACH, AT LEAST 1 HOUR BEFORE OR 1 HOUR AFTER MEALS.  . Isavuconazonium Sulfate (CRESEMBA) 186 MG CAPS Take 2 capsules (372 mg total) by mouth 3 (three) times daily. X 2 days. Followed by 2 capsules per day thereafter (Patient not taking: Reported on 03/11/2019)  . predniSONE (DELTASONE) 50 MG tablet Take for contrast allergy prior to study. Take one tablet 13 hours  before study, one tablet 7 hours before study,and one tablet 1 hour before study (Patient not taking: Reported on 03/11/2019)   No facility-administered encounter medications on file as of 03/11/2019.      Patient Active Problem List   Diagnosis Date Noted  . Mediastinal adenopathy   . H/O coccidioidomycosis 05/16/2018  . DOE (dyspnea on exertion) 05/03/2018  . Diarrhea 04/22/2018  . Cavitary lesion of lung in area of previous cyts in apex of Sup Segment of LLL 03/29/2018  . Aortic atherosclerosis ([place]) 01/22/2018  . Emphysema lung ([place]) 01/22/2018  . CAP (community acquired pneumonia) 11/23/2017  . Metastasis to brain ([place]) 08/04/2017  . Brain metastasis ([place]) 07/27/2017  . Nicotine abuse 07/27/2017  . Migraines 07/27/2017  . HCAP (healthcare-associated pneumonia) 11/26/2016  . Upper airway cough syndrome 08/10/2016  . Abnormal echocardiogram 08/07/2013  . Chest tightness or pressure 08/07/2013  . Hx of radiation therapy   . Edema of left lower extremity 11/19/2012  . Tachycardia 09/20/2012  . Breast cancer of upper-inner quadrant of left female breast ([place]) 06/08/2012  . Obstructive bronchiectasis ([place]) 01/26/2011  . Cigarette smoker 01/26/2011     Health Maintenance Due  Topic Date Due  . TETANUS/TDAP  01/18/1987  . PAP SMEAR-Modifier  11/20/2016  . COLONOSCOPY  01/17/2018  . INFLUENZA VACCINE  02/09/2019     Review of Systems 12 point ros is otherwise negative except what is mentioned above Physical Exam   BP 99/66   Pulse 76   Temp 98.3 F (36.8 C) (Oral)  LMP 07/25/2012 Comment: pregnancy waiver form signed 01-18-2018  Physical Exam  Constitutional:  oriented to person, place, and time. appears well-developed and well-nourished. No distress.  HENT: [place]/AT, PERRLA, no scleral icterus Mouth/Throat: Oropharynx is clear and moist. No oropharyngeal exudate.  Cardiovascular: Normal rate, regular rhythm and normal heart sounds. Exam reveals no gallop and no friction  rub.  No murmur heard.  Pulmonary/Chest: Effort normal and mild rhonchi at left lower base. No respiratory distress. Neck = supple, no nuchal rigidity Lymphadenopathy: no cervical adenopathy. No axillary adenopathy Neurological: alert and oriented to person, place, and time.  Skin: Skin is warm and dry. No rash noted. No erythema.  Psychiatric: a normal mood and affect.  behavior is normal.    CBC Lab Results  Component Value Date   WBC 9.9 12/10/2018   RBC 4.00 12/10/2018   HGB 11.5 (L) 12/10/2018   HCT 35.9 12/10/2018   PLT 491 (H) 12/10/2018   MCV 89.8 12/10/2018   MCH 28.8 12/10/2018   MCHC 32.0 12/10/2018   RDW 15.2 (H) 12/10/2018   LYMPHSABS 2,069 12/10/2018   MONOABS 0.6 07/27/2018   EOSABS 723 (H) 12/10/2018    BMET Lab Results  Component Value Date   NA 140 12/10/2018   K 5.6 (H) 12/10/2018   CL 106 12/10/2018   CO2 22 12/10/2018   GLUCOSE 87 12/10/2018   BUN 12 12/10/2018   CREATININE 0.92 12/10/2018   CALCIUM 10.1 12/10/2018   GFRNONAA 73 12/10/2018   GFRAA 84 12/10/2018      Assessment and Plan Bronchiectasis exacerbation = will get sputum cx, give amox/clav trial.  Continue on anti-fungal = will check cbc and cmp  Cough = needs new cough medication

## 2019-03-13 ENCOUNTER — Ambulatory Visit
Admission: RE | Admit: 2019-03-13 | Discharge: 2019-03-13 | Disposition: A | Discharge status: Home / Self Care | Payer: BC Managed Care – PPO | Source: Ambulatory Visit | Attending: Urology | Admitting: Urology

## 2019-03-13 ENCOUNTER — Other Ambulatory Visit: Payer: Self-pay

## 2019-03-13 ENCOUNTER — Ambulatory Visit: Payer: Self-pay | Admitting: Urology

## 2019-03-13 DIAGNOSIS — C7931 Secondary malignant neoplasm of brain: Secondary | ICD-10-CM

## 2019-03-13 NOTE — Progress Notes (Signed)
Radiation Oncology         [phone] ________________________________  Name: Melissa [name] MRN: [id number]  Date: 03/13/2019  DOB: 19-Mar-1966  Post Treatment Note  CC: Patient, [name]  No ref. provider found  Diagnosis:   53 y.o. female with  brain metastasis, ER PR positive, HER-2 positive, from invasive ductal carcinoma of the left breast.  Interval Since Last Radiation:  6 months  09/04/2018:   Brain, Right Frontal, 2 targets / 20 Gy in 1 fraction PTV3: Ant Rt Frontal 50m 20Gy PTV4: Rt Frontal resection cavity 550m 20Gy  12/18/2017:   PTV2: 4 mm Rt Parietal lesion treated to 20 Gy in 1 Fx  08/25/2017, 08/28/2017, 08/30/2017, 09/01/2017, 09/04/2017: PTV1: post op SRS to right frontal lobe resection cavity in 5 fxs  Narrative:  I spoke with the patient to conduct her routine scheduled 3 month follow up visit to review MRI brain results via telephone to spare the patient unnecessary potential exposure in the healthcare setting during the current COVID-19 pandemic.  The patient was notified in advance and gave permission to proceed with this visit format.  In summary, she initially presented to the emergency Department on 07/27/2017 with complaints of headaches ongoing for the past 5 weeks with associated nausea and vomiting. She was initially treated by her PCP for sinusitis without improvement.  She also has a history of migraine headaches and [facility] treatment of what she thought was a typical migraine headache given the fact that she had her usual blurry vision and aura preceding the headache. She reports that the headache did improve with treatment but the relief was short-lived as the headache returned the very next day. She followed up with her primary care physician and a CT of the head was going to be scheduled due to the persistent headaches but in the interim, [facility]  increased severity of the headache with associated nausea and vomiting. The headaches were occuring in the frontal lobe as well as bilateral temporal with occasional radiation to the occipital lobe and associated with blurry vision, decreased appetite, fatigue, nausea and vomiting.  She denied any difficulty with speech, memory, imbalance or focal weakness.  CT Head on admission 07/27/2017 showed a large right frontal lobe mass measuring 3.1 by 2.5 cm with significant surrounding edema with mass effect and right to left midline shift measuring 8 mm. A subsequent Brain MRI showed a 3.4 x 2.9 x 2.9 cm right frontal lobe mass with imaging characteristics of solitary metastasis and extensive vasogenic edema resulting in 9 mm right to left midline shift as well as equivocal very early left ventricle entrapment.   CT Chest, Abdomen, Pelvis on 07/28/2017 for disease staging showed no findings to suggest metastatic breast cancer involving the chest, abdomen, pelvis or osseous structures. 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 Surgical changes involving the left breast and left axilla but no findings for local recurrence or regional adenopathy.  She proceeded with a gross total resection of the frontal lesion on 08/05/17 with Dr. DiCyndy Freezeollowed by adjuvant fractionated SRS radiotherapy to the resection cavity in February 2019. Final surgical pathology revealed poorly differentiated adenocarcinoma consistent with metastatic breast cancer, ER/PR positive and HER-2 positive.   She tolerated her initial SRS treatment well and initial post treatment MRI brain on 12/07/17 showed satisfactory appearance of  the right anterior frontal lobe post treatment site with expected evolution but unfortunately also showed a single, new 2 - 3 mm lesion in the right parietal lobe without associated edema or mass-effect.  She elected to undergo salvage SRS treatment to the new lesion  in the parietal lobe which was completed on 12/18/17 and tolerated well.  A follow up brain MRI on 05/18/18 showed a stable right frontal resection cavity with stable 4 mm nodular enhancement at the inferior cavity margin but no evidence of new disease.  Unfortunately, the follow up MRI brain scan on 08/23/2018 showed a new 11 mm inferior right frontal lesion with only mild associated edema and no mass-effect.  She reported that she had been having more frequent frontal headaches which were not severe and responded to Aleve and otherwise, had been feeling well.  She elected to proceed with a single fraction of SRS to this new brain metastasis as recommended and this was completed on  09/04/18 and tolerated very well.   She had bronchoscopy with Dr. Valeta Harms in 05/2018 and dx'ed with Aspergillus- ID following (Dr. Graylon Good).  She also continues in routine follow up with Dr. Melvyn Novas in Pulmonology.  Interval History:   She remains on 1000 mglapatinibwith Letrozole 2.5 mg for systemic therapy under the care and direction of Dr. Lindi Adie and tolerates this well.  We reviewed her recent follow up MRI brain scan from 03/08/19 which showed continued interval enlargement of the nodular focus with enhancement at the superior margin of the right frontal resection cavity, measuring 5 x 6 mm in diameter compared with 3 x 4.5 mm on the previous study. Two smaller foci of enhancement along the inferior margin are unchanged and there is no evidence of increasing mass effect or edema. There are no new lesions seen elsewhere within the brain.  On review of systems, the patient states that she is doing well overall.  She is without complaints aside from mild residual fatigue and chronic cough.  She has continued with improved energy levels and has been able to continue to work. She denies headaches, decreased visual or auditory acuity, tinnitus, tremor, focal weakness or seizure activity. She is not having difficulty with speech or word  finding.  She denies abdominal pain, N/V or diarrhea.  She remains on Letrozole and immunotherapy with Lapatinib and is tolerating this well. She reports a healthy appetite and is steadily gaining her weight back which she is pleased with.  ALLERGIES:  is allergic to aspirin; protonix [pantoprazole]; and iodinated diagnostic agents.  Meds: Current Outpatient Medications  Medication Sig Dispense Refill  . albuterol (VENTOLIN HFA) 108 (90 BASE) MCG/ACT inhaler Inhale 2 puffs into the lungs every 6 (six) hours as needed. 1 Inhaler 3  . amoxicillin-clavulanate (AUGMENTIN) 875-125 MG tablet Take 1 tablet by mouth 2 (two) times daily. 14 tablet 0  . budesonide-formoterol (SYMBICORT) 80-4.5 MCG/ACT inhaler Inhale 2 puffs into the lungs 2 (two) times daily. 1 Inhaler 5  . budesonide-formoterol (SYMBICORT) 80-4.5 MCG/ACT inhaler Inhale 2 puffs into the lungs 2 (two) times a day. 1 Inhaler 0  . budesonide-formoterol (SYMBICORT) 80-4.5 MCG/ACT inhaler Inhale 2 puffs into the lungs 2 (two) times daily. 1 Inhaler 5  . Chlorpheniramine-Codeine 2-10 MG/5ML LIQD Take 5 mLs by mouth every 6 (six) hours as needed. 200 mL 1  . HYDROcodone-guaiFENesin 2.5-200 MG/5ML SOLN Take 5 mLs by mouth every 8 (eight) hours. 560 mL 0  . Isavuconazonium Sulfate (CRESEMBA) 186 MG CAPS Take 2 capsules (372 mg  total) by mouth 3 (three) times daily. X 2 days. Followed by 2 capsules per day thereafter (Patient not taking: Reported on 03/11/2019) 60 capsule 6  . letrozole (FEMARA) 2.5 MG tablet Take 1 tablet (2.5 mg total) by mouth daily. 30 tablet 11  . levalbuterol (XOPENEX) 0.63 MG/3ML nebulizer solution Take 3 mLs (0.63 mg total) by nebulization every 4 (four) hours as needed for wheezing or shortness of breath. 540 mL 6  . naproxen sodium (ALEVE) 220 MG tablet Take 440 mg by mouth 2 (two) times daily as needed (headache).     . predniSONE (DELTASONE) 50 MG tablet Take for contrast allergy prior to study. Take one tablet 13 hours  before study, one tablet 7 hours before study,and one tablet 1 hour before study (Patient not taking: Reported on 03/11/2019) 30 tablet 0  . TYKERB 250 MG tablet TAKE 4 TABLETS (1,000 MG TOTAL) BY MOUTH DAILY. TAKE ON AN EMPTY STOMACH, AT LEAST 1 HOUR BEFORE OR 1 HOUR AFTER MEALS. 120 tablet 3   No current facility-administered medications for this encounter.     Physical Findings:  vitals were not taken for this visit.   /Unable to assess due to virtual/telephone visit format.  Lab Findings: Lab Results  Component Value Date   WBC 13.3 (H) 03/11/2019   HGB 9.7 (L) 03/11/2019   HCT 30.4 (L) 03/11/2019   MCV 88.4 03/11/2019   PLT 488 (H) 03/11/2019     Radiographic Findings: Mr Jeri Cos XH Contrast  Result Date: 03/08/2019 CLINICAL DATA:  Metastatic breast cancer treated with S RS. EXAM: MRI HEAD WITHOUT AND WITH CONTRAST TECHNIQUE: Multiplanar, multiecho pulse sequences of the brain and surrounding structures were obtained without and with intravenous contrast. CONTRAST:  37m MULTIHANCE GADOBENATE DIMEGLUMINE 529 MG/ML IV SOLN COMPARISON:  12/06/2018 and multiple previous FINDINGS: Brain: Slight enlargement of a nodular enhancing focus at the superior margin of the right frontal resection, measuring 5 x 6 mm in diameter compared with 3 x 4.5 mm on the previous study. Two smaller foci of enhancement along the inferior margin are unchanged. No evidence of increasing mass effect or edema. No new lesions seen elsewhere within the brain. Elsewhere, there are mild chronic small-vessel ischemic changes of the hemispheric white matter. No hydrocephalus or extra-axial collection. Vascular: Major vessels at the base of the brain show flow. Skull and upper cervical spine: Negative Sinuses/Orbits: Clear/normal Other: None IMPRESSION: Further enlargement of a nodular focus of enhancement at the superior margin of the right frontal resection, now measuring 5 x 6 mm compared with 3 x 4.5 mm previously. The  other 2 small foci of nodular enhancement are stable since the last examination and actually the medial nodular focus is smaller than was seen on the study of 08/23/2018. Electronically Signed   By: MNelson ChimesM.D.   On: 03/08/2019 12:03    Impression/Plan: 138 53y.o. female with  brain metastasis, ER PR positive, HER-2 positive, from invasive ductal carcinoma of the left breast. She appears to have recovered well [facility] and is currently without CNS complaints.  We reviewed her recent follow up MRI brain scan from 03/08/19 which showed slight enlargement of a nodular enhancing focus at the superior margin of the right frontal resection, measuring 5 x 6 mm in diameter compared with 3 x 4.5 mm on the previous study.  Her scan and case were reviewed at the recent brain tumor board on 03/11/19 and recommendation is to proceed with salvage  SRS In a single fraction to the site of suspected disease progression in the resection cavity of the frontal lobe. We reviewed expected acute and late side effects associated with this treatment.  She has given verbal consent to proceed today and will be scheduled for CT SIM/treatment planning in the near future.  Her treatment will be coordinated with neurosurgery who will also participate in her care. She knows to call immediately for any new or progressive neurologic symptoms or questions or concerns related to her previous radiotherapy.  She appears to have a good understanding of her disease and our recommendations and is comfortable and in agreement with the current plan as stated.  Given current concerns for patient exposure during the COVID-19 pandemic, this encounter was conducted via telephone. The patient was notified in advance and was offered a [place] meeting to allow for face to face communication but unfortunately reported that she did not have the appropriate resources/technology to support such a visit and instead preferred to proceed with  telephone consult. The patient has given verbal consent for this type of encounter. The time spent during this encounter was 20 minutes with 50% of time spent in coordination of care. The attendants for this meeting include Malijah Lietz PA-C, and patient, Maziyah Vessel. During the encounter, Rakayla Ricklefs PA-C was located at [facility] Radiation Oncology Department.  Patient, Marvin Maenza was located at home.    Nicholos Johns, PA-C

## 2019-03-19 ENCOUNTER — Ambulatory Visit
Admission: RE | Admit: 2019-03-19 | Discharge: 2019-03-19 | Disposition: A | Discharge status: Home / Self Care | Payer: BC Managed Care – PPO | Source: Ambulatory Visit | Attending: Radiation Oncology | Admitting: Radiation Oncology

## 2019-03-19 ENCOUNTER — Other Ambulatory Visit: Payer: Self-pay

## 2019-03-19 DIAGNOSIS — C7949 Secondary malignant neoplasm of other parts of nervous system: Secondary | ICD-10-CM

## 2019-03-19 DIAGNOSIS — C7931 Secondary malignant neoplasm of brain: Secondary | ICD-10-CM

## 2019-03-19 DIAGNOSIS — C50212 Malignant neoplasm of upper-inner quadrant of left female breast: Secondary | ICD-10-CM | POA: Diagnosis not present

## 2019-03-19 DIAGNOSIS — Z510 Encounter for antineoplastic radiation therapy: Secondary | ICD-10-CM | POA: Insufficient documentation

## 2019-03-19 DIAGNOSIS — Z170 Estrogen receptor positive status [ER+]: Secondary | ICD-10-CM | POA: Insufficient documentation

## 2019-03-19 NOTE — Progress Notes (Signed)
  Radiation Oncology         [phone] ________________________________  Name: Melissa [name] MRN: [id number]  Date: 03/19/2019  DOB: [birth year]  SIMULATION AND TREATMENT PLANNING NOTE    ICD-10-CM   1. 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  Identity was confirmed.  All relevant records and images related to the planned course of therapy were reviewed.  The patient freely provided informed written consent to proceed with treatment after reviewing the details related to the planned course of therapy. The consent form was witnessed and verified by the simulation staff. Intravenous access was established for contrast administration. Then, the patient was set-up in a stable reproducible supine position for radiation therapy.  A relocatable thermoplastic stereotactic head frame was fabricated for precise immobilization.  CT images were obtained.  Surface markings were placed.  The CT images were loaded into the planning software and fused with the patient's targeting MRI scan.  Then the target and avoidance structures were contoured.  Treatment planning then occurred.  The radiation prescription was entered and confirmed.  I have requested 3D planning  I have requested a DVH of the following structures: Brain stem, brain, left eye, right eye, lenses, optic chiasm, target volumes, uninvolved brain, and normal tissue.    SPECIAL TREATMENT PROCEDURE:  The planned course of therapy using radiation constitutes a special treatment procedure. Special care is required in the management of this patient for the following reasons. This treatment constitutes a Special Treatment Procedure for the following reason: High dose per fraction requiring special monitoring for increased toxicities of treatment including daily imaging.   The special nature of the planned course of radiotherapy will require increased physician supervision and oversight to ensure patient's safety with optimal treatment outcomes.  PLAN:  The patient will receive 20 Gy in 1 fraction.  ________________________________  Sheral Apley Tammi Klippel, M.D.

## 2019-03-19 NOTE — Progress Notes (Signed)
No IV start. Patient allergic to contrast. Consent to be obtained in sim.

## 2019-03-20 ENCOUNTER — Telehealth: Payer: Self-pay | Admitting: Pharmacist

## 2019-03-20 DIAGNOSIS — Z170 Estrogen receptor positive status [ER+]: Secondary | ICD-10-CM

## 2019-03-20 DIAGNOSIS — C50212 Malignant neoplasm of upper-inner quadrant of left female breast: Secondary | ICD-10-CM

## 2019-03-20 MED ORDER — TYKERB 250 MG PO TABS: 1000.0000 mg | ORAL_TABLET | Freq: Every day | ORAL | 3 refills | Status: DC

## 2019-03-20 NOTE — Telephone Encounter (Signed)
Oral Oncology Pharmacist Encounter  Received notification from the [facility] that Tykerb prescription is no longer able to be processed at their pharmacy and must be filled at [facility] per insurance requirement. Patient had transitioned to [employer] from [employer]. This occurred in May 2020.  Prescription for lapatinib 250 mg tablets, take 4 tablets (1000 mg) by mouth once daily on an empty stomach, 1 hour before or 1 hour after meals, quantity #120, refills = 3, has been e-scribed to [facility] in [place], [place], per insurance requirement.  I called patient and informed her of above information. I provided pharmacy phone number to patient [phone]  Tykerb prescription has been cancelled at the [facility].  All questions answered. Patient knows to call the office for additional questions or concerns. She also knows to look out for a call from [facility] to schedule her first shipment of Tykerb from that dispensing pharmacy.  Johny Drilling, PharmD, BCPS, BCOP  03/20/2019 9:04 AM Oral Oncology Clinic [phone]

## 2019-03-21 ENCOUNTER — Telehealth: Payer: Self-pay | Admitting: *Deleted

## 2019-03-21 DIAGNOSIS — C7931 Secondary malignant neoplasm of brain: Secondary | ICD-10-CM | POA: Diagnosis not present

## 2019-03-21 NOTE — Telephone Encounter (Addendum)
Melissa [name] called to ask about her sputum results. She is asking if these were expected or different and a cause for concern.  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    She reports completing her oral antibiotics Sunday, but still has symptoms of productive cough with pale green/light yellow sputum, same level of difficulty breathing, diarrhea twice a day (has been present for several weeks, started before her antibiotics). She denies fevers.  She is scheduled for follow up 9/28, but would like your input before this appointment. Please advise. Landis Gandy, RN

## 2019-03-25 ENCOUNTER — Ambulatory Visit
Admission: RE | Admit: 2019-03-25 | Discharge: 2019-03-25 | Disposition: A | Discharge status: Home / Self Care | Payer: BC Managed Care – PPO | Source: Ambulatory Visit | Attending: Radiation Oncology | Admitting: Radiation Oncology

## 2019-03-25 ENCOUNTER — Other Ambulatory Visit: Payer: Self-pay

## 2019-03-25 ENCOUNTER — Encounter: Payer: Self-pay | Admitting: Radiation Oncology

## 2019-03-25 ENCOUNTER — Telehealth: Payer: Self-pay

## 2019-03-25 VITALS — BP 98/56 | HR 75 | Temp 98.5°F | Resp 18

## 2019-03-25 DIAGNOSIS — C7931 Secondary malignant neoplasm of brain: Secondary | ICD-10-CM | POA: Diagnosis not present

## 2019-03-25 DIAGNOSIS — C7949 Secondary malignant neoplasm of other parts of nervous system: Secondary | ICD-10-CM

## 2019-03-25 NOTE — Telephone Encounter (Signed)
Oral Oncology Patient Advocate Encounter  Prior Authorization for Tykerb has been approved.    PA# [id number] Effective dates: 03/25/19 through 03/23/20  Oral Oncology Clinic will continue to follow.   [place] Patient [place] Phone [phone] Fax [phone] 03/25/2019    10:31 AM

## 2019-03-25 NOTE — Op Note (Signed)
  Name: Melissa [name]  MRN: [id number]  Date: 03/25/2019   DOB: Oct 30, 1965  Stereotactic Radiosurgery Operative Note  PRE-OPERATIVE DIAGNOSIS:  Solitary Brain Metastasis  POST-OPERATIVE DIAGNOSIS:  Solitary Brain Metastasis  PROCEDURE:  Stereotactic Radiosurgery  SURGEON:  Peggyann Shoals, MD  NARRATIVE: The patient underwent a radiation treatment planning session in the radiation oncology simulation suite under the care of the radiation oncology physician and physicist.  I participated closely in the radiation treatment planning afterwards. The patient underwent planning CT which was fused to 3T high resolution MRI with 1 mm axial slices.  These images were fused on the planning system.  We contoured the gross target volumes and subsequently expanded this to yield the Planning Target Volume. I actively participated in the planning process.  I helped to define and review the target contours and also the contours of the optic pathway, eyes, brainstem and selected nearby organs at risk.  All the dose constraints for critical structures were reviewed and compared to AAPM Task Group 101.  The prescription dose conformity was reviewed.  I approved the plan electronically.    Accordingly, [name] was brought to the TrueBeam stereotactic radiation treatment linac and placed in the custom immobilization mask.  The patient was aligned according to the IR fiducial markers with BrainLab Exactrac, then orthogonal x-rays were used in ExacTrac with the 6DOF robotic table and the shifts were made to align the patient  [name] received stereotactic radiosurgery uneventfully.    The detailed description of the procedure is recorded in the radiation oncology procedure note.  I was present for the duration of the procedure.  DISPOSITION:  Following delivery, the patient was transported to nursing in stable condition and monitored for possible acute effects to be discharged to home in stable condition with  follow-up in one month.  Peggyann Shoals, MD 03/25/2019 8:12 AM

## 2019-03-25 NOTE — Progress Notes (Signed)
  Radiation Oncology         [phone] ________________________________  Stereotactic Treatment Procedure Note  Name: SEENA DOBBINS MRN: [id number]  Date: 03/25/2019  DOB: 25-Apr-1966  SPECIAL TREATMENT PROCEDURE    ICD-10-CM   1. 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 Metastasis to brain [facility])  C79.31     3D TREATMENT PLANNING AND DOSIMETRY:  The patient's radiation plan was reviewed and approved by neurosurgery and radiation oncology prior to treatment.  It showed 3-dimensional radiation distributions overlaid onto the planning CT/MRI image set.  The [facility] for the target structures as well as the organs at risk were reviewed. The documentation of the 3D plan and dosimetry are filed in the radiation oncology EMR.  NARRATIVE:  SHAIAN KOCHIS was brought to the TrueBeam stereotactic radiation treatment machine and placed supine on the CT couch. The head frame was applied, and the patient was set up for stereotactic radiosurgery.  Neurosurgery was present for the set-up and delivery  SIMULATION VERIFICATION:  In the couch zero-angle position, the patient underwent Exactrac imaging using the Brainlab system with orthogonal KV images.  These were carefully aligned and repeated to confirm treatment position for each of the isocenters.  The Exactrac snap film verification was repeated at each couch angle.  PROCEDURE: Craig Guess Ryker received stereotactic radiosurgery to the following targets: Right frontal 5 mm target was treated using 3 Dynamic Conformal Arcs to a prescription dose of 20 Gy.  ExacTrac registration was performed for each couch angle.  The 100% isodose line was prescribed.  6 MV X-rays were delivered in the flattening filter free beam mode.  STEREOTACTIC TREATMENT MANAGEMENT:  Following delivery, the patient was transported to nursing in stable condition and monitored for possible acute effects.  Vital signs were recorded BP (!) 98/56    Pulse 75   Temp 98.5 F (36.9 C) (Oral)   Resp 18   LMP 07/25/2012 Comment: pregnancy waiver form signed 01-18-2018. The patient tolerated treatment without significant acute effects, and was discharged to home in stable condition.    PLAN: Follow-up in one month.  ________________________________  Sheral Apley. Tammi Klippel, M.D.

## 2019-03-25 NOTE — Telephone Encounter (Signed)
Oral Oncology Patient Advocate Encounter  Received notification from [facility] that prior authorization for Tykerb is required.  PA submitted on CoverMyMeds Key [id number] Status is pending  Oral Oncology Clinic will continue to follow.  [place] Patient [place] Phone [phone] Fax [phone] 03/25/2019    10:31 AM

## 2019-03-25 NOTE — Progress Notes (Signed)
Nurse monitoring complete following SRS treatment. Patient alert and oriented x 3. No distress noted. Denies pain. Patient denies any new neurologic symptoms. Instructed patient to avoid strenuous activities for the next 24 hours. Instructed patient to call [phone] for needs or concerns associated with today's treatment. Patient verbalized understanding of all reviewed. Patient exited clinic ambulatory and in no distress. Patient confirms her daughter is driving her home. Steady gait noted as she exited the clinic.

## 2019-03-31 NOTE — Progress Notes (Signed)
  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  ExacTrac registration was performed for each couch angle.  The 100% isodose line was prescribed.  6 MV X-rays were delivered in the flattening filter free beam mode.  Narrative: The patient tolerated radiation treatment relatively well.   No complications occurred.  Plan: The patient has completed radiation treatment. The patient will return to radiation oncology clinic for routine followup in one month. I advised her to call or return sooner if she has any questions or concerns related to her recovery or treatment. ________________________________  Sheral Apley. Tammi Klippel, M.D.

## 2019-04-08 ENCOUNTER — Encounter: Payer: Self-pay | Admitting: Internal Medicine

## 2019-04-08 ENCOUNTER — Other Ambulatory Visit: Payer: Self-pay

## 2019-04-08 ENCOUNTER — Telehealth: Payer: Self-pay | Admitting: Pharmacy Technician

## 2019-04-08 ENCOUNTER — Ambulatory Visit (INDEPENDENT_AMBULATORY_CARE_PROVIDER_SITE_OTHER): Payer: BC Managed Care – PPO | Admitting: Internal Medicine

## 2019-04-08 VITALS — BP 94/56 | HR 84

## 2019-04-08 DIAGNOSIS — J984 Other disorders of lung: Secondary | ICD-10-CM | POA: Diagnosis not present

## 2019-04-08 DIAGNOSIS — B4489 Other forms of aspergillosis: Secondary | ICD-10-CM | POA: Diagnosis not present

## 2019-04-08 DIAGNOSIS — Z23 Encounter for immunization: Secondary | ICD-10-CM

## 2019-04-08 LAB — RESPIRATORY CULTURE OR RESPIRATORY AND SPUTUM CULTURE
MICRO NUMBER:: 844254
RESULT:: NORMAL
SPECIMEN QUALITY:: ADEQUATE

## 2019-04-08 LAB — FUNGUS CULTURE W SMEAR
MICRO NUMBER:: 832041
SPECIMEN QUALITY:: ADEQUATE

## 2019-04-08 MED ORDER — CRESEMBA 186 MG PO CAPS: 2.0000 | ORAL_CAPSULE | Freq: Three times a day (TID) | ORAL | 6 refills | Status: DC

## 2019-04-08 NOTE — Telephone Encounter (Signed)
[facility] PATIENT ADVOCATE ENCOUNTER  [name] needs her Cresemba prescription sent to the specialty pharmacy required by her insurance, [facility] in [place] where her Tykerb medication is being filled.  [name] in triage stated she will transfer the prescription there today.  [name]. [name] Melissa [name] Patient [facility] Phone: [phone] Fax:  [phone]

## 2019-04-08 NOTE — Progress Notes (Signed)
RFV: follow up for exacerbation of bronchiectasis  Patient ID: Melissa [name], female   DOB: [birth year], 53 y.o.   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 Though she did have exacerbation for which she improved with course of amox/clav. Sputum cultures did reveal kleb pneumoniae and c.tropicalis. she feels that she is somewhat improved. Since we last saw her, she did undergo radiotactic EB radiation of brain mets, had slight headache post procedure but otherwise tolerated. Outpatient Encounter Medications as of 04/08/2019  Medication Sig  . albuterol (VENTOLIN HFA) 108 (90 BASE) MCG/ACT inhaler Inhale 2 puffs into the lungs every 6 (six) hours as needed.  . budesonide-formoterol (SYMBICORT) 160-4.5 MCG/ACT inhaler Inhale 2 puffs into the lungs 2 (two) times daily.  . Chlorpheniramine-Codeine 2-10 MG/5ML LIQD Take 5 mLs by mouth every 6 (six) hours as needed.  Marland Kitchen HYDROcodone-guaiFENesin 2.5-200 MG/5ML SOLN Take 5 mLs by mouth every 8 (eight) hours.  . Isavuconazonium Sulfate (CRESEMBA) 186 MG CAPS Take 2 capsules (372 mg total) by mouth 3 (three) times daily. X 2 days. Followed by 2 capsules per day thereafter  . lapatinib (TYKERB) 250 MG tablet Take 4 tablets (1,000 mg total) by mouth daily. Take on an empty stomach, at least 1 hour before or 1 hour after meals.  Marland Kitchen letrozole (FEMARA) 2.5 MG tablet Take 1 tablet (2.5 mg total) by mouth daily.  Marland Kitchen levalbuterol (XOPENEX) 0.63 MG/3ML nebulizer solution Take 3 mLs (0.63 mg total) by nebulization every 4 (four) hours as needed for wheezing or shortness of breath.  . naproxen sodium (ALEVE) 220 MG tablet Take 440 mg by mouth 2 (two) times daily as needed (headache).   . predniSONE (DELTASONE) 50 MG tablet Take for  contrast allergy prior to study. Take one tablet 13 hours before study, one tablet 7 hours before study,and one tablet 1 hour before study  . [DISCONTINUED] budesonide-formoterol (SYMBICORT) 80-4.5 MCG/ACT inhaler Inhale 2 puffs into the lungs 2 (two) times daily.  . [DISCONTINUED] budesonide-formoterol (SYMBICORT) 80-4.5 MCG/ACT inhaler Inhale 2 puffs into the lungs 2 (two) times a day.  . [DISCONTINUED] budesonide-formoterol (SYMBICORT) 80-4.5 MCG/ACT inhaler Inhale 2 puffs into the lungs 2 (two) times daily.  Marland Kitchen amoxicillin-clavulanate (AUGMENTIN) 875-125 MG tablet Take 1 tablet by mouth 2 (two) times daily. (Patient not taking: Reported on 04/08/2019)   No facility-administered encounter medications on file as of 04/08/2019.      Patient Active Problem List   Diagnosis Date Noted  . Mediastinal adenopathy   . H/O coccidioidomycosis 05/16/2018  . DOE (dyspnea on exertion) 05/03/2018  . Diarrhea 04/22/2018  . Cavitary lesion of lung in area of previous cyts in apex of Sup Segment of LLL 03/29/2018  . Aortic atherosclerosis ([place]) 01/22/2018  . Emphysema lung ([place]) 01/22/2018  . CAP (community acquired pneumonia) 11/23/2017  . Metastasis to brain ([place]) 08/04/2017  . Brain metastasis ([place]) 07/27/2017  . Nicotine abuse 07/27/2017  . Migraines 07/27/2017  . HCAP (healthcare-associated pneumonia) 11/26/2016  . Upper airway cough syndrome 08/10/2016  . Abnormal echocardiogram 08/07/2013  . Chest tightness or pressure 08/07/2013  . Hx of radiation therapy   . Edema of left lower extremity 11/19/2012  . Tachycardia 09/20/2012  . Breast cancer of upper-inner quadrant of left female breast ([place]) 06/08/2012  .  Obstructive bronchiectasis ([place]) 01/26/2011  . Cigarette smoker 01/26/2011     Health Maintenance Due  Topic Date Due  . TETANUS/TDAP  01/18/1987  . PAP SMEAR-Modifier  11/20/2016  . COLONOSCOPY  01/17/2018  . INFLUENZA VACCINE  02/09/2019    Social History   Tobacco Use  .  Smoking status: Current Every Day Smoker    Packs/day: 0.75    Years: 24.00    Pack years: 18.00    Types: Cigarettes  . Smokeless tobacco: Never Used  Substance Use Topics  . Alcohol use: No  . Drug use: No   Review of Systems Still has productive cough, dyspnea with exertion but feels at her baseline. Physical Exam   BP (!) 94/56   Pulse 84   LMP 07/25/2012 Comment: pregnancy waiver form signed 01-18-2018  Physical Exam  Constitutional:  oriented to person, place, and time. appears well-developed and well-nourished. No distress.  HENT: [place]/AT, PERRLA, no scleral icterus Mouth/Throat: Oropharynx is clear and moist. No oropharyngeal exudate.  Cardiovascular: Normal rate, regular rhythm and normal heart sounds. Exam reveals no gallop and no friction rub.  No murmur heard.  Pulmonary/Chest: Effort normal and breath sounds have mild rhonchi at bases. Clears with cough Neck = supple, no nuchal rigidity Lymphadenopathy: no cervical adenopathy. No axillary adenopathy Neurological: alert and oriented to person, place, and time.  Skin: Skin is warm and dry. No rash noted. No erythema.  Psychiatric: a normal mood and affect.  behavior is normal.   CBC Lab Results  Component Value Date   WBC 13.3 (H) 03/11/2019   RBC 3.44 (L) 03/11/2019   HGB 9.7 (L) 03/11/2019   HCT 30.4 (L) 03/11/2019   PLT 488 (H) 03/11/2019   MCV 88.4 03/11/2019   MCH 28.2 03/11/2019   MCHC 31.9 (L) 03/11/2019   RDW 15.7 (H) 03/11/2019   LYMPHSABS 1,902 03/11/2019   MONOABS 0.6 07/27/2018   EOSABS 1,410 (H) 03/11/2019    BMET Lab Results  Component Value Date   NA 140 03/11/2019   K 4.7 03/11/2019   CL 104 03/11/2019   CO2 28 03/11/2019   GLUCOSE 87 03/11/2019   BUN 8 03/11/2019   CREATININE 0.88 03/11/2019   CALCIUM 9.8 03/11/2019   GFRNONAA 76 03/11/2019   GFRAA 88 03/11/2019      Assessment and Plan Hx of pulmonary aspergillosis = reassuring that her recent imaging appeared improved. Still  plan to continue with anti-fungal therapy for extended period of time. Imaging from 7/23 still showed enlarging cavitary lesion, thus not significant improvement Will check B-Dglucan Would recommend to continue with cresemba, will refill  At next appt, plan to redo chest CT in 8wk to see if improvement.  Will check CMP in 2 wk to ensure tolerating antifungal therapy  Health maintenance= Give flu shot

## 2019-04-08 NOTE — Telephone Encounter (Signed)
Per [name], LPN transferred patient's [place] caps to [place], [place]. Left voicemail for [facility] outpatient pharmacy to cancel current prescription. [place]

## 2019-04-09 ENCOUNTER — Telehealth: Payer: Self-pay

## 2019-04-09 NOTE — Telephone Encounter (Signed)
Patient called office today to confirm Cresemba was sent to [facility] in [place]. Patient states she called pharmacy this morning and was told they did not receive prescription. Prescription was sent electronically on 9/28. Left voicemail with [facility] requesting call back to confirm prescription was received. [place]

## 2019-04-10 LAB — FUNGITELL (1-3)-B-D-GLUCAN
(1-3)-B-D-Glucan: <31 pg/mL (ref <60)
Interpretation: NEGATIVE

## 2019-04-15 NOTE — Telephone Encounter (Signed)
PA approved for Cresemba. Will fax PA approval to [facility]. [place]

## 2019-04-22 ENCOUNTER — Other Ambulatory Visit: Payer: Self-pay

## 2019-04-23 ENCOUNTER — Telehealth: Payer: Self-pay | Admitting: Internal Medicine

## 2019-04-23 NOTE — Telephone Encounter (Signed)
Called spoke with patient and verified that she is requesting a refill on her Symbicort   Last office visit 6.4.2020 with MW, follow up as needed  Advised will refill Symbicort as requested.  Patient requested coupon and the cost last time was $100.  Called [facility] with Rx and copay card info - lowest we could get the cost was $50.  Called patient to inform her, received her VM.  Advised Rx was called in and to please call the office back if cost remains and issue.  Nothing further needed; will sign off.

## 2019-04-29 ENCOUNTER — Other Ambulatory Visit: Payer: Self-pay | Admitting: *Deleted

## 2019-04-29 ENCOUNTER — Telehealth: Payer: Self-pay

## 2019-04-29 DIAGNOSIS — J471 Bronchiectasis with (acute) exacerbation: Secondary | ICD-10-CM

## 2019-04-29 NOTE — Telephone Encounter (Signed)
Received verbal order per Dr. Baxter Flattery to obtain sputum culture and refill cough syrup medication if needed. Patient made aware of Dr. Storm Frisk advise and wanted to know if she couch also take OTC Mucinex DM along with the Cresemba? Routing to Dr. Baxter Flattery for advise. Patient states she already has a specimen cup available at home and will drop off at [facility] either tomorrow or Wednesday. [name]

## 2019-04-29 NOTE — Telephone Encounter (Signed)
Patient is calling with complaints of left side pain when coughing, it feels like her lung is coming apart.  Pain scale rating of 9 but only when coughing. Productive yellow phlegm. he pain seems to be getting worse.  She restarted the Cresemba 2 weeks ago.   Please advise.    Laverle Patter, RN

## 2019-04-30 ENCOUNTER — Other Ambulatory Visit: Payer: Self-pay | Admitting: *Deleted

## 2019-05-01 ENCOUNTER — Encounter: Payer: Self-pay | Admitting: Urology

## 2019-05-01 ENCOUNTER — Ambulatory Visit
Admission: RE | Admit: 2019-05-01 | Discharge: 2019-05-01 | Disposition: A | Discharge status: Home / Self Care | Payer: BC Managed Care – PPO | Source: Ambulatory Visit | Attending: Urology | Admitting: Urology

## 2019-05-01 ENCOUNTER — Other Ambulatory Visit: Payer: Self-pay

## 2019-05-01 DIAGNOSIS — C7931 Secondary malignant neoplasm of brain: Secondary | ICD-10-CM

## 2019-05-01 NOTE — Progress Notes (Addendum)
Radiation Oncology         [phone] ________________________________  Name: Melissa [name] MRN: [id number]  Date: 05/01/2019  DOB: 04-17-1966  Post Treatment Note  CC: Patient, [name]  [name], [name], *  Diagnosis:   53 y.o. 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  Interval Since Last Radiation:  1 month 03/25/2019:  SRS brain//PTV 5:  Right frontal 5 mm target was treated to a prescription dose of 20 Gy in a single fraction.   09/04/2018:   SRS Brain// Right Frontal, 2 targets / 20 Gy in 1 fraction PTV3: Ant Rt Frontal 34m 20Gy PTV4: Rt Frontal resection cavity 58m 20Gy  12/18/2017: SRS brain//PTV2: 4 mm Rt Parietal lesion treated to 20 Gy in 1 Fx  08/25/2017, 08/28/2017, 08/30/2017, 09/01/2017, 09/04/2017: PTV1: post op SRS to right frontal lobe resection cavity in 5 fxs  Narrative:  I spoke with the patient to conduct her routine scheduled 1 month posttreatment follow up visit via telephone to spare the patient unnecessary potential exposure in the healthcare setting during the current COVID-19 pandemic.  The patient was notified in advance and gave permission to proceed with this visit format.  In summary, she initially presented to the emergency Department on 07/27/2017 with complaints of headaches ongoing for approximately 5 weeks with associated nausea and vomiting. She was initially treated by her PCP for sinusitis without improvement.  She also has a history of migraine headaches and [facility] treatment of what she thought was a typical migraine headache given the fact that she had her usual blurry vision and aura preceding the headache. She reports that the headache did improve with treatment but the relief was short-lived as the headache returned the very next day. She followed up with her primary care  physician and a CT of the head was going to be scheduled due to the persistent headaches but in the interim, [facility] severity of the headache with associated nausea and vomiting. The headaches were occuring in the frontal lobe as well as bilateral temporal with occasional radiation to the occipital lobe and associated with blurry vision, decreased appetite, fatigue, nausea and vomiting.  She denied any difficulty with speech, memory, imbalance or focal weakness.  CT Head on admission 07/27/2017 showed a large right frontal lobe mass measuring 3.1 by 2.5 cm with significant surrounding edema with mass effect and right to left midline shift measuring 8 mm. A subsequent Brain MRI showed a 3.4 x 2.9 x 2.9 cm right frontal lobe mass with imaging characteristics of solitary metastasis and extensive vasogenic edema resulting in 9 mm right to left midline shift as well as equivocal very early left ventricle entrapment.   CT Chest, Abdomen, Pelvis on 07/28/2017 for disease staging showed no findings to suggest metastatic breast cancer involving the chest, abdomen, pelvis or osseous structures. 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 Surgical changes involving the left breast and left axilla but no findings for local recurrence or regional adenopathy.  She proceeded with a gross total resection of the frontal lesion on 08/05/17 with Dr. DiCyndy Freezeollowed by adjuvant fractionated postop SRS radiotherapy to the resection cavity in February 2019. Final surgical pathology revealed poorly differentiated adenocarcinoma consistent with metastatic  breast cancer, ER/PR positive and HER-2 positive.   She tolerated SRS treatment well and initial post treatment MRI brain on 12/07/17 showed satisfactory appearance of the right anterior frontal lobe post treatment site with expected evolution but unfortunately also showed a  single, new 2 - 3 mm lesion in the right parietal lobe without associated edema or mass-effect.  She elected to undergo salvage SRS treatment to the new lesion in the parietal lobe which was completed on 12/18/17 and tolerated well.  A follow up brain MRI on 05/18/18 showed a stable right frontal resection cavity with stable 4 mm nodular enhancement at the inferior cavity margin but no evidence of new disease.  Unfortunately, the follow up MRI brain scan on 08/23/2018 showed a new 11 mm inferior right frontal lesion with only mild associated edema and no mass-effect.  She reported that she had been having more frequent frontal headaches which were not severe and responded to Aleve and otherwise, had been feeling well.  She elected to proceed with a single fraction of SRS to this new brain metastasis as recommended and this was completed on  09/04/18 and tolerated very well.   She had bronchoscopy with Dr. Valeta Harms in 05/2018 and dx'ed with Aspergillus- ID following (Dr. Graylon Good).  She also continues in routine follow up with Dr. Melvyn Novas in Pulmonology.  Follow up MRI brain scan from 03/08/19 showed continued interval enlargement of the nodular focus with enhancement at the superior margin of the right frontal resection cavity, measuring 5 x 6 mm in diameter compared with 3 x 4.5 mm on the previous study. Two smaller foci of enhancement along the inferior margin were unchanged and there was no evidence of increasing mass effect or edema and no new lesions seen elsewhere within the brain.  She elected to proceed with SRS treatment of the progressive nodular lesion which was completed on 03/25/19 and tolerated well without any acute ill effects.    Interval History:   She has remained clinically stable since the time of her last SRS treatment and is currently without complaints. She remains on 1000 mglapatinibwith Letrozole 2.5 mg for systemic therapy under the care and direction of Dr. Lindi Adie and continues to tolerate this  well.  On review of systems, the patient states that she is doing well overall.  She is without complaints aside from mild residual fatigue and chronic cough.  She has continued with improved energy levels and has been able to continue to work. She denies headaches, decreased visual or auditory acuity, tinnitus, tremor, focal weakness or seizure activity. She is not having difficulty with speech or word finding.  She denies abdominal pain, N/V or diarrhea.  She remains on Letrozole and immunotherapy with Lapatinib and is tolerating this well. She reports a healthy appetite and is steadily gaining her weight back which she is pleased with.  ALLERGIES:  is allergic to aspirin; protonix [pantoprazole]; and iodinated diagnostic agents.  Meds: Current Outpatient Medications  Medication Sig Dispense Refill  . albuterol (VENTOLIN HFA) 108 (90 BASE) MCG/ACT inhaler Inhale 2 puffs into the lungs every 6 (six) hours as needed. 1 Inhaler 3  . budesonide-formoterol (SYMBICORT) 160-4.5 MCG/ACT inhaler Inhale 2 puffs into the lungs 2 (two) times daily.    . Isavuconazonium Sulfate (CRESEMBA) 186 MG CAPS Take 2 capsules (372 mg total) by mouth 3 (three) times daily. X 2 days. Followed by 2 capsules per day thereafter 60 capsule 6  . lapatinib (TYKERB) 250 MG tablet Take 4 tablets (1,000  mg total) by mouth daily. Take on an empty stomach, at least 1 hour before or 1 hour after meals. 120 tablet 3  . letrozole (FEMARA) 2.5 MG tablet Take 1 tablet (2.5 mg total) by mouth daily. 30 tablet 11  . levalbuterol (XOPENEX) 0.63 MG/3ML nebulizer solution Take 3 mLs (0.63 mg total) by nebulization every 4 (four) hours as needed for wheezing or shortness of breath. 540 mL 6  . naproxen sodium (ALEVE) 220 MG tablet Take 440 mg by mouth 2 (two) times daily as needed (headache).     Marland Kitchen VIRTUSSIN A/C 100-10 MG/5ML syrup TK 5 MLS PO EVERY 8 HOURS    . Chlorpheniramine-Codeine 2-10 MG/5ML LIQD Take 5 mLs by mouth every 6 (six) hours  as needed. (Patient not taking: Reported on 05/01/2019) 200 mL 1  . HYDROcodone-guaiFENesin 2.5-200 MG/5ML SOLN Take 5 mLs by mouth every 8 (eight) hours. (Patient not taking: Reported on 05/01/2019) 560 mL 0  . predniSONE (DELTASONE) 50 MG tablet Take for contrast allergy prior to study. Take one tablet 13 hours before study, one tablet 7 hours before study,and one tablet 1 hour before study (Patient not taking: Reported on 05/01/2019) 30 tablet 0   No current facility-administered medications for this encounter.     Physical Findings:  vitals were not taken for this visit.   /Unable to assess due to virtual/telephone visit format.  Lab Findings: Lab Results  Component Value Date   WBC 13.3 (H) 03/11/2019   HGB 9.7 (L) 03/11/2019   HCT 30.4 (L) 03/11/2019   MCV 88.4 03/11/2019   PLT 488 (H) 03/11/2019     Radiographic Findings: No results found.  Impression/Plan: 1. 53 y.o. 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 She appears to have recovered well from her recent [facility] treatment and is currently without CNS complaints.  We discussed the plan to proceed with a follow up MRI brain scan in approximately 2 months to assess treatment response and as long as this scan appears stable, we will resume her routine follow up schedule with serial brain MRI scans every 3 months with follow up thereafter to review results and recommendations from the [facility] tumor board.  She will also continue in routine follow up with Dr. Lindi Adie for continued management of her systemic disease.  She has a follow up CT Chest on 05/09/19, prior to her scheduled follow up visit with Dr. Lindi Adie on 05/14/19. She appears to have a good understanding of her disease and our recommendations and is comfortable and in agreement with the current plan as stated.  Given current concerns for patient exposure  during the COVID-19 pandemic, this encounter was conducted via telephone. The patient was notified in advance and was offered a [facility] meeting to allow for face to face communication but unfortunately reported that she did not have the appropriate resources/technology to support such a visit and instead preferred to proceed with telephone follow up visit. The patient has given verbal consent for this type of encounter. The time spent during this encounter was 15 minutes with 50% of time spent in coordination of care. The attendants for this meeting include Melissa Sporrer PA-C, and patient, Melissa [name]. During the encounter, Melissa Prezioso PA-C was located at [facility] Radiation Oncology Department.  Patient, Melissa [name] was located at home.    Nicholos Johns, PA-C

## 2019-05-02 ENCOUNTER — Other Ambulatory Visit: Payer: Self-pay | Admitting: Internal Medicine

## 2019-05-02 DIAGNOSIS — J471 Bronchiectasis with (acute) exacerbation: Secondary | ICD-10-CM

## 2019-05-09 ENCOUNTER — Ambulatory Visit (HOSPITAL_COMMUNITY)
Admission: RE | Admit: 2019-05-09 | Discharge: 2019-05-09 | Disposition: A | Discharge status: Home / Self Care | Payer: BC Managed Care – PPO | Source: Ambulatory Visit | Attending: Hematology and Oncology | Admitting: Hematology and Oncology

## 2019-05-09 ENCOUNTER — Other Ambulatory Visit: Payer: Self-pay

## 2019-05-09 DIAGNOSIS — J471 Bronchiectasis with (acute) exacerbation: Secondary | ICD-10-CM | POA: Diagnosis present

## 2019-05-09 MED ORDER — IOHEXOL 300 MG/ML  SOLN
75.0000 mL | Freq: Once | INTRAMUSCULAR | Status: AC | PRN
  Administered 2019-05-09: 11:00:00 75 mL via INTRAVENOUS

## 2019-05-09 MED ORDER — SODIUM CHLORIDE (PF) 0.9 % IJ SOLN
INTRAMUSCULAR | Status: AC
  Filled 2019-05-09: qty 50

## 2019-05-10 ENCOUNTER — Other Ambulatory Visit: Payer: Self-pay | Admitting: *Deleted

## 2019-05-10 DIAGNOSIS — C7931 Secondary malignant neoplasm of brain: Secondary | ICD-10-CM

## 2019-05-13 NOTE — Progress Notes (Signed)
Patient Care Team: Patient, [name] as PCP - General (General Practice) [name], MD as Referring Physician (Emergency Medicine)  DIAGNOSIS:    ICD-10-CM   1. Malignant neoplasm of upper-inner quadrant of left breast in female, estrogen receptor positive ([place])  C50.212    Z17.0     SUMMARY OF ONCOLOGIC HISTORY: Oncology History  Breast cancer of upper-inner quadrant of left female breast ([place])  06/08/2012 Initial Diagnosis   invasive ductal carcinoma that was ER positive PR positive HER-2/neu positive measuring 3.1 cm by MRI criteria. Ki-67 was 70% HER-2 was amplified with a ratio 2.91   07/12/2012 - 07/17/2013 Neo-Adjuvant Chemotherapy   TCH 6 followed by Herceptin maintenance   12/11/2012 Surgery   Left breast lumpectomy: 1.8 cm tumor 1 positive sentinel node, axillary lymph node dissection 02/08/2013 showed 0/13 lymph nodes   03/25/2013 - 05/06/2013 Radiation Therapy   Adjuvant radiation therapy   06/05/2013 - 07/20/2017 Anti-estrogen oral therapy   Tamoxifen 20 mg daily   07/27/2017 Relapse/Recurrence   MRI Brain: 3.4 x 2.9 x 2.9 cm RIGHT frontal lobe mass with imaging characteristics of solitary metastasis. Extensive vasogenic edema resulting in 9 mm RIGHT to LEFT midline shift. Equivocal very early LEFT ventricle entrapment.    08/04/2017 Surgery   Rt frontal brain resection: Poorly differentiated tumor IHC suggests breast primary ER and PR Positive   08/25/2017 - 09/04/2017 Radiation Therapy   Stereotactic radiation   09/18/2017 -  Anti-estrogen oral therapy   Lapatinib with letrozole   12/18/2017 - 12/19/2017 Radiation Therapy   New right parietal lobe metastases status post [facility]   05/09/2019 Relapse/Recurrence   Interval increase in size of the enhancing nodular left internal mammary soft tissue 2.8 cm.  Redemonstrated enlarged supraclavicular, lower cervical and lower posterior cervical nodes unchanged.  Interval increase in the bony erosion of the posterior  and lateral left third rib, increasing soft tissue lesion eroding the left sternal body 3.1 cm was 2.5 cm.  Bronchiectatic changes     CHIEF COMPLIANT: Follow-up of metastatic breast cancer on lapatinib with letrozole  INTERVAL HISTORY: Melissa [name] is a 53 y.o. with above-mentioned history of metastatic breast cancer with brain metastasiss/presection who is currently on lapatinib with letrozole. Brain MRI on 03/08/19 showed further enhancement of a nodular focus at the superior margin of the right frontal resection increased to 70m from 4.576mpreviously, 2 other foci stable, and the medial nodular focus decreased in size. Chest CT on 05/09/19 showed increase in the breast nodule from 2.1 to 2.8cm, stable supraclavicular, lower cervical, and left posterior cervical lymph nodes, and worsening bony metastases. She presents to the clinic today to review her CT and discuss further treatment.   REVIEW OF SYSTEMS:   Constitutional: Denies fevers, chills or abnormal weight loss Eyes: Denies blurriness of vision Ears, nose, mouth, throat, and face: Denies mucositis or sore throat Respiratory: Denies cough, dyspnea or wheezes Cardiovascular: Denies palpitation, chest discomfort Gastrointestinal: Denies nausea, heartburn or change in bowel habits Skin: Denies abnormal skin rashes Lymphatics: Denies new lymphadenopathy or easy bruising Neurological: Denies numbness, tingling or new weaknesses Behavioral/Psych: Mood is stable, no new changes  Extremities: No lower extremity edema Breast: denies any pain or lumps or nodules in either breasts All other systems were reviewed with the patient and are negative.  I have reviewed the past medical history, past surgical history, social history and family history with the patient and they are unchanged from previous note.  ALLERGIES:  is allergic  to aspirin; protonix [pantoprazole]; and iodinated diagnostic agents.  MEDICATIONS:  Current Outpatient  Medications  Medication Sig Dispense Refill  . albuterol (VENTOLIN HFA) 108 (90 BASE) MCG/ACT inhaler Inhale 2 puffs into the lungs every 6 (six) hours as needed. 1 Inhaler 3  . budesonide-formoterol (SYMBICORT) 160-4.5 MCG/ACT inhaler Inhale 2 puffs into the lungs 2 (two) times daily.    . Chlorpheniramine-Codeine 2-10 MG/5ML LIQD Take 5 mLs by mouth every 6 (six) hours as needed. (Patient not taking: Reported on 05/01/2019) 200 mL 1  . HYDROcodone-guaiFENesin 2.5-200 MG/5ML SOLN Take 5 mLs by mouth every 8 (eight) hours. (Patient not taking: Reported on 05/01/2019) 560 mL 0  . Isavuconazonium Sulfate (CRESEMBA) 186 MG CAPS Take 2 capsules (372 mg total) by mouth 3 (three) times daily. X 2 days. Followed by 2 capsules per day thereafter 60 capsule 6  . lapatinib (TYKERB) 250 MG tablet Take 4 tablets (1,000 mg total) by mouth daily. Take on an empty stomach, at least 1 hour before or 1 hour after meals. 120 tablet 3  . letrozole (FEMARA) 2.5 MG tablet Take 1 tablet (2.5 mg total) by mouth daily. 30 tablet 11  . levalbuterol (XOPENEX) 0.63 MG/3ML nebulizer solution Take 3 mLs (0.63 mg total) by nebulization every 4 (four) hours as needed for wheezing or shortness of breath. 540 mL 6  . naproxen sodium (ALEVE) 220 MG tablet Take 440 mg by mouth 2 (two) times daily as needed (headache).     . predniSONE (DELTASONE) 50 MG tablet Take for contrast allergy prior to study. Take one tablet 13 hours before study, one tablet 7 hours before study,and one tablet 1 hour before study (Patient not taking: Reported on 05/01/2019) 30 tablet 0  . VIRTUSSIN A/C 100-10 MG/5ML syrup TK 5 MLS PO EVERY 8 HOURS     No current facility-administered medications for this visit.     PHYSICAL EXAMINATION: ECOG PERFORMANCE STATUS: 1 - Symptomatic but completely ambulatory  Vitals:   05/14/19 0938  BP: 108/62  Pulse: 79  Resp: 18  Temp: 99.1 F (37.3 C)  SpO2: 100%   Filed Weights   05/14/19 0938  Weight: 109 lb  11.2 oz (49.8 kg)    GENERAL: alert, no distress and comfortable SKIN: skin color, texture, turgor are normal, no rashes or significant lesions EYES: normal, Conjunctiva are pink and non-injected, sclera clear OROPHARYNX: no exudate, no erythema and lips, buccal mucosa, and tongue normal  NECK: supple, thyroid normal size, non-tender, without nodularity LYMPH: no palpable lymphadenopathy in the cervical, axillary or inguinal LUNGS: clear to auscultation and percussion with normal breathing effort HEART: regular rate & rhythm and no murmurs and no lower extremity edema ABDOMEN: abdomen soft, non-tender and normal bowel sounds MUSCULOSKELETAL: no cyanosis of digits and no clubbing  NEURO: alert & oriented x 3 with fluent speech, no focal motor/sensory deficits EXTREMITIES: No lower extremity edema  LABORATORY DATA:  I have reviewed the data as listed CMP Latest Ref Rng & Units 03/11/2019 12/10/2018 11/05/2018  Glucose 65 - 99 mg/dL 87 87 87  BUN 7 - 25 mg/dL '8 12 9  '$ Creatinine 0.50 - 1.05 mg/dL 0.88 0.92 0.99  Sodium 135 - 146 mmol/L 140 140 141  Potassium 3.5 - 5.3 mmol/L 4.7 5.6(H) 4.7  Chloride 98 - 110 mmol/L 104 106 105  CO2 20 - 32 mmol/L '28 22 28  '$ Calcium 8.6 - 10.4 mg/dL 9.8 10.1 10.2  Total Protein 6.1 - 8.1 g/dL 7.3 7.8 7.5  Total  Bilirubin 0.2 - 1.2 mg/dL 0.3 0.3 0.3  Alkaline Phos 38 - 126 U/L - - -  AST 10 - 35 U/L '14 28 15  '$ ALT 6 - 29 U/L '12 20 13    '$ Lab Results  Component Value Date   WBC 10.5 05/14/2019   HGB 10.3 (L) 05/14/2019   HCT 32.4 (L) 05/14/2019   MCV 83.9 05/14/2019   PLT 425 (H) 05/14/2019   NEUTROABS 6.2 05/14/2019    ASSESSMENT & PLAN:  Breast cancer of upper-inner quadrant of left female breast (HCC) 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 Anti Her 2 therapy with Lapatinibstarted 09/17/2017-05/14/2019: Stopped for progression 3.I discontinuedtamoxifen and started her on letrozole 2.5 mg daily.  05/14/2019 stopped for progression 4.  Stereotactic radiosurgery 12/19/2017 to the new right parietal lobe metastases. 5. Xeloda, Herceptin, Tucatinib: Selected primarily because of brain metastases.  Bronchoscopy and biopsy revealed aspergillosis Patient is seeing infectious disease as well as pulmonary with Dr. Melvyn Novas. She completed antifungal therapy with Dr. Baxter Flattery.  Brain MRI  03/08/2019: Enlargement of the nodular focus of enhancement right frontal resection 5 x 6 mm compared to 3 x 4.5 cm previously.  The other 2 small foci of stable CT chest 05/09/2019: Interval increase in size of the enhancing nodular left internal mammary soft tissue 2.8 cm.  Redemonstrated enlarged supraclavicular, lower cervical and lower posterior cervical nodes unchanged.  Interval increase in the bony erosion of the posterior and lateral left third rib, increasing soft tissue lesion eroding the left sternal body 3.1 cm was 2.5 cm.  Bronchiectatic changes  Treatment plan: I will discuss with interventional radiology to see if 1 of these lymph nodes can be biopsied.  Differential diagnosis is Aspergillus related versus metastatic breast cancer related.  She was just started on a new medication for aspergillosis. If it is breast cancer greater than we will have to switch her treatment to something like Xeloda, Tucatinib and Herceptin. If it is aspergillosis related then hopefully the new treatment will work. If it cannot be biopsied then we will plan for 93-monthfollow-up CT scan.    No orders of the defined types were  placed in this encounter.  The patient has a good understanding of the overall plan. she agrees with it. she will call with any problems that may develop before the next visit here.  GNicholas Lose MD 05/14/2019  IJulious OkaDorshimer am acting as scribe for Dr. VNicholas Lose  I have reviewed the above documentation for accuracy and completeness, and I agree with the above.

## 2019-05-14 ENCOUNTER — Telehealth: Payer: Self-pay | Admitting: Hematology and Oncology

## 2019-05-14 ENCOUNTER — Inpatient Hospital Stay: Payer: BC Managed Care – PPO

## 2019-05-14 ENCOUNTER — Other Ambulatory Visit: Payer: Self-pay

## 2019-05-14 ENCOUNTER — Other Ambulatory Visit: Payer: Self-pay | Admitting: Hematology and Oncology

## 2019-05-14 ENCOUNTER — Inpatient Hospital Stay: Payer: BC Managed Care – PPO | Attending: Medical | Admitting: Hematology and Oncology

## 2019-05-14 DIAGNOSIS — Z170 Estrogen receptor positive status [ER+]: Secondary | ICD-10-CM | POA: Insufficient documentation

## 2019-05-14 DIAGNOSIS — Z923 Personal history of irradiation: Secondary | ICD-10-CM | POA: Diagnosis not present

## 2019-05-14 DIAGNOSIS — C7951 Secondary malignant neoplasm of bone: Secondary | ICD-10-CM | POA: Diagnosis not present

## 2019-05-14 DIAGNOSIS — C50212 Malignant neoplasm of upper-inner quadrant of left female breast: Secondary | ICD-10-CM

## 2019-05-14 DIAGNOSIS — R911 Solitary pulmonary nodule: Secondary | ICD-10-CM

## 2019-05-14 DIAGNOSIS — C7931 Secondary malignant neoplasm of brain: Secondary | ICD-10-CM | POA: Diagnosis not present

## 2019-05-14 DIAGNOSIS — C778 Secondary and unspecified malignant neoplasm of lymph nodes of multiple regions: Secondary | ICD-10-CM | POA: Diagnosis not present

## 2019-05-14 DIAGNOSIS — R590 Localized enlarged lymph nodes: Secondary | ICD-10-CM

## 2019-05-14 DIAGNOSIS — B449 Aspergillosis, unspecified: Secondary | ICD-10-CM | POA: Diagnosis not present

## 2019-05-14 LAB — CMP (CANCER CENTER ONLY)
ALT: 9 U/L (ref 0–44)
AST: 13 U/L — ABNORMAL LOW (ref 15–41)
Albumin: 3.5 g/dL (ref 3.5–5.0)
Alkaline Phosphatase: 145 U/L — ABNORMAL HIGH (ref 38–126)
Anion gap: 11 (ref 5–15)
BUN: 10 mg/dL (ref 6–20)
CO2: 25 mmol/L (ref 22–32)
Calcium: 10 mg/dL (ref 8.9–10.3)
Chloride: 102 mmol/L (ref 98–111)
Creatinine: 1.01 mg/dL — ABNORMAL HIGH (ref 0.44–1.00)
GFR, Est AFR Am: >60 mL/min (ref >60)
GFR, Estimated: >60 mL/min (ref >60)
Glucose, Bld: 87 mg/dL (ref 70–99)
Potassium: 3.9 mmol/L (ref 3.5–5.1)
Sodium: 138 mmol/L (ref 135–145)
Total Bilirubin: <0.2 mg/dL — ABNORMAL LOW (ref 0.3–1.2)
Total Protein: 8.2 g/dL — ABNORMAL HIGH (ref 6.5–8.1)

## 2019-05-14 LAB — CBC WITH DIFFERENTIAL (CANCER CENTER ONLY)
Abs Immature Granulocytes: 0.03 10*3/uL (ref 0.00–0.07)
Basophils Absolute: 0.1 10*3/uL (ref 0.0–0.1)
Basophils Relative: 1 %
Eosinophils Absolute: 1 10*3/uL — ABNORMAL HIGH (ref 0.0–0.5)
Eosinophils Relative: 10 %
HCT: 32.4 % — ABNORMAL LOW (ref 36.0–46.0)
Hemoglobin: 10.3 g/dL — ABNORMAL LOW (ref 12.0–15.0)
Immature Granulocytes: 0 %
Lymphocytes Relative: 23 %
Lymphs Abs: 2.4 10*3/uL (ref 0.7–4.0)
MCH: 26.7 pg (ref 26.0–34.0)
MCHC: 31.8 g/dL (ref 30.0–36.0)
MCV: 83.9 fL (ref 80.0–100.0)
Monocytes Absolute: 0.9 10*3/uL (ref 0.1–1.0)
Monocytes Relative: 8 %
Neutro Abs: 6.2 10*3/uL (ref 1.7–7.7)
Neutrophils Relative %: 58 %
Platelet Count: 425 10*3/uL — ABNORMAL HIGH (ref 150–400)
RBC: 3.86 MIL/uL — ABNORMAL LOW (ref 3.87–5.11)
RDW: 17.4 % — ABNORMAL HIGH (ref 11.5–15.5)
WBC Count: 10.5 10*3/uL (ref 4.0–10.5)
nRBC: 0 % (ref 0.0–0.2)

## 2019-05-14 NOTE — Telephone Encounter (Signed)
I talk with patient regarding schedule  

## 2019-05-14 NOTE — Progress Notes (Signed)
After discussion with interventional radiology Dr. Pascal Lux, we decided to do a PET CT scan initially and then determine the best place to biopsy.

## 2019-05-14 NOTE — Assessment & Plan Note (Signed)
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 Anti Her 2 therapy with Lapatinibstarted 09/17/2017-05/14/2019: Stopped for progression 3.I discontinuedtamoxifen and started her on letrozole 2.5 mg daily.  05/14/2019 stopped for progression 4.  Stereotactic radiosurgery 12/19/2017 to the new right parietal lobe metastases. 5. Xeloda, Herceptin, Tucatinib: Selected primarily because of brain metastases.  Bronchoscopy and biopsy revealed aspergillosis Patient is seeing infectious disease as well as pulmonary with Dr. Melvyn Novas. She completed antifungal therapy with Dr. Baxter Flattery.  Brain MRI  03/08/2019: Enlargement of the nodular focus of enhancement right frontal resection 5 x 6 mm compared to 3 x 4.5 cm previously.  The other 2 small foci of stable CT chest 05/09/2019: Interval increase in size of the enhancing nodular left internal mammary soft tissue 2.8 cm.  Redemonstrated enlarged supraclavicular, lower cervical and lower posterior cervical nodes unchanged.  Interval increase in the bony erosion of the posterior and lateral left third rib, increasing soft tissue lesion eroding the left sternal body 3.1 cm was 2.5 cm.  Bronchiectatic changes  Treatment plan: I recommended switching her treatment to Xeloda Herceptin Tucatinib based on the HER-2 climb clinical trial  Tucatinib is a tyrosine kinase inhibitor that targets HER-2 and her 3 receptors. The major toxicities of this combination are hand-foot syndrome 63% and skin rashes, diarrhea 81%, hepatotoxicity 42%, renal and  liver function abnormalities, nausea vomiting and anemia.  The initial dose is 300 mg p.o. twice daily.  The efficacy was studied in [facility] trial enrolled 612 patients with HER2-positive metastatic breast cancer. median PFS in patients receiving tucatinib was 7.8 months (95% CI: 7.5, 9.6) compared to 5.6 months (95% CI: 4.2, 7.1) for patients enrolled on the control arm (HR 0.54; 95% CI: 0.42, 0.71; p<0.00001). median OS in patients on the tucatinib arm was 21.9 months (95% CI: 18.3, 31.0) compared to 17.4 months (95% CI: 13.6, 19.9) for patients on the control arm (HR: 0.66; 95% CI: 0.50, 0.87; p=[id number]). The median PFS for patients with baseline brain metastases on the tucatinib arm was 7.6 months (95% CI: 6.2, 9.5) compared to 5.4 months (95% CI: 4.1, 5.7) for those on the control arm (HR: 0.48; 0.34, 0.69; p<0.00001).

## 2019-05-16 ENCOUNTER — Ambulatory Visit (INDEPENDENT_AMBULATORY_CARE_PROVIDER_SITE_OTHER): Payer: BC Managed Care – PPO | Admitting: Internal Medicine

## 2019-05-16 ENCOUNTER — Encounter: Payer: Self-pay | Admitting: Internal Medicine

## 2019-05-16 ENCOUNTER — Other Ambulatory Visit: Payer: Self-pay | Admitting: Radiation Therapy

## 2019-05-16 ENCOUNTER — Other Ambulatory Visit: Payer: Self-pay

## 2019-05-16 VITALS — BP 99/65 | HR 86 | Temp 98.0°F | Wt 112.0 lb

## 2019-05-16 DIAGNOSIS — B4489 Other forms of aspergillosis: Secondary | ICD-10-CM | POA: Diagnosis not present

## 2019-05-16 DIAGNOSIS — Z5181 Encounter for therapeutic drug level monitoring: Secondary | ICD-10-CM | POA: Diagnosis not present

## 2019-05-16 DIAGNOSIS — J984 Other disorders of lung: Secondary | ICD-10-CM | POA: Diagnosis not present

## 2019-05-16 DIAGNOSIS — R591 Generalized enlarged lymph nodes: Secondary | ICD-10-CM | POA: Diagnosis not present

## 2019-05-16 NOTE — Progress Notes (Signed)
RFV: follow up pulmonary aspergillosis  Patient ID: Melissa [name], female   DOB: 02-Jun-1966, 53 y.o.   MRN: [id number]  HPI  53yo F with some worsening of shortness of breath and new left sided supraclavicular lymphadenopathy -prompting and had repeat imaging She continues to take cresemba - she has been back on it for just over a month. Less coughing is what she has noticed. She denies fever or chills, but LN are the newest change in health. She had repeat imaging that shows new lesions  IMPRESSION: 1. Interval increase in size of enhancing nodular left internal mammary soft tissue, measuring approximately 2.8 x 1.4 cm in aggregate, previously 2.1 x 1.2 cm when measured similarly (series 2, image 38). Redemonstrated enlarged supraclavicular, lower cervical, and left posterior cervical lymph nodes, which do not appear to be changed in size (series 2, image 9, 5).  2. There is interval increase in bony erosion of the posterior and lateral left third rib adjacent to pleural soft tissue (series 2, image 27). Interval increase in soft tissue lesion eroding the left aspect of the sternal body at the level of the left third costochondral junction, soft tissue nodule measuring at least 3.1 x 2.2 cm, previously 2.5 x 2.0 cm when measured similarly (series 2, image 58).  3. Above constellation of findings is consistent with malignant recurrence.  4. Status post left upper lobectomy. Redemonstrated bronchiectatic and cavitary changes of the superior left lower lobe. The dominant cavity of the left lower lobe has increased in size, now with increased nodular soft tissue in the dependent portion (series 7, image 39). There may be fungal superinfection of this cavity (i.e. aspergilloma). 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  Outpatient Encounter Medications as of 05/16/2019  Medication Sig  . albuterol (VENTOLIN  HFA) 108 (90 BASE) MCG/ACT inhaler Inhale 2 puffs into the lungs every 6 (six) hours as needed.  . budesonide-formoterol (SYMBICORT) 160-4.5 MCG/ACT inhaler Inhale 2 puffs into the lungs 2 (two) times daily.  . Chlorpheniramine-Codeine 2-10 MG/5ML LIQD Take 5 mLs by mouth every 6 (six) hours as needed. (Patient not taking: Reported on 05/01/2019)  . HYDROcodone-guaiFENesin 2.5-200 MG/5ML SOLN Take 5 mLs by mouth every 8 (eight) hours. (Patient not taking: Reported on 05/01/2019)  . Isavuconazonium Sulfate (CRESEMBA) 186 MG CAPS Take 2 capsules (372 mg total) by mouth 3 (three) times daily. X 2 days. Followed by 2 capsules per day thereafter  . lapatinib (TYKERB) 250 MG tablet Take 4 tablets (1,000 mg total) by mouth daily. Take on an empty stomach, at least 1 hour before or 1 hour after meals.  Marland Kitchen letrozole (FEMARA) 2.5 MG tablet Take 1 tablet (2.5 mg total) by mouth daily.  Marland Kitchen levalbuterol (XOPENEX) 0.63 MG/3ML nebulizer solution Take 3 mLs (0.63 mg total) by nebulization every 4 (four) hours as needed for wheezing or shortness of breath.  . naproxen sodium (ALEVE) 220 MG tablet Take 440 mg by mouth 2 (two) times daily as needed (headache).   . predniSONE (DELTASONE) 50 MG tablet Take for contrast allergy prior to study. Take one tablet 13 hours before study, one tablet 7 hours before study,and one tablet 1 hour before study (Patient not taking: Reported on 05/01/2019)  . VIRTUSSIN A/C 100-10 MG/5ML syrup TK 5 MLS PO EVERY 8 HOURS   No facility-administered encounter medications on file as of 05/16/2019.      Patient Active Problem List   Diagnosis Date  Noted  . Mediastinal adenopathy   . H/O coccidioidomycosis 05/16/2018  . DOE (dyspnea on exertion) 05/03/2018  . Diarrhea 04/22/2018  . Cavitary lesion of lung in area of previous cyts in apex of Sup Segment of LLL 03/29/2018  . Aortic atherosclerosis ([place]) 01/22/2018  . Emphysema lung ([place]) 01/22/2018  . CAP (community acquired pneumonia)  11/23/2017  . Metastasis to brain ([place]) 08/04/2017  . Brain metastasis ([place]) 07/27/2017  . Nicotine abuse 07/27/2017  . Migraines 07/27/2017  . Malignant tumor of breast ([place]) 06/12/2017  . HCAP (healthcare-associated pneumonia) 11/26/2016  . Upper airway cough syndrome 08/10/2016  . Abnormal echocardiogram 08/07/2013  . Chest tightness or pressure 08/07/2013  . Hx of radiation therapy   . Edema of left lower extremity 11/19/2012  . Tachycardia 09/20/2012  . Breast cancer of upper-inner quadrant of left female breast ([place]) 06/08/2012  . Obstructive bronchiectasis ([place]) 01/26/2011  . Cigarette smoker 01/26/2011     Health Maintenance Due  Topic Date Due  . TETANUS/TDAP  01/18/1987  . PAP SMEAR-Modifier  11/20/2016  . COLONOSCOPY  01/17/2018     Review of Systems 12 point ros is negative, except what is mentioned above Physical Exam   BP 99/65   Pulse 86   Temp 98 F (36.7 C) (Oral)   Wt 112 lb (50.8 kg)   LMP 07/25/2012 Comment: pregnancy waiver form signed 01-18-2018  BMI 20.49 kg/m   Physical Exam  Constitutional:  oriented to person, place, and time. appears well-developed and well-nourished. No distress.  HENT: [place]/AT, PERRLA, no scleral icterus Mouth/Throat: Oropharynx is clear and moist. No oropharyngeal exudate.  Cardiovascular: Normal rate, regular rhythm and normal heart sounds. Exam reveals no gallop and no friction rub.  No murmur heard.  Pulmonary/Chest: Effort normal and breath sounds normal. Bilateral rhonchi clears with cough Neck = supple, no nuchal rigidity Abdominal: Soft. Bowel sounds are normal.  exhibits no distension. There is no tenderness.  Lymphadenopathy: no cervical adenopathy. No axillary adenopathy Neurological: alert and oriented to person, place, and time.  Skin: Skin is warm and dry. No rash noted. No erythema.  Psychiatric: a normal mood and affect.  behavior is normal.   CBC Lab Results  Component Value Date   WBC 10.5 05/14/2019    RBC 3.86 (L) 05/14/2019   HGB 10.3 (L) 05/14/2019   HCT 32.4 (L) 05/14/2019   PLT 425 (H) 05/14/2019   MCV 83.9 05/14/2019   MCH 26.7 05/14/2019   MCHC 31.8 05/14/2019   RDW 17.4 (H) 05/14/2019   LYMPHSABS 2.4 05/14/2019   MONOABS 0.9 05/14/2019   EOSABS 1.0 (H) 05/14/2019    BMET Lab Results  Component Value Date   NA 138 05/14/2019   K 3.9 05/14/2019   CL 102 05/14/2019   CO2 25 05/14/2019   GLUCOSE 87 05/14/2019   BUN 10 05/14/2019   CREATININE 1.01 (H) 05/14/2019   CALCIUM 10.0 05/14/2019   GFRNONAA >60 05/14/2019   GFRAA >60 05/14/2019      Assessment and Plan  Aspergillosis = continue with cresemba  New lymphadenopathy = will need further work up to see if further metastatic disease vs infection  Long term medication monitoring = lft and cr stable

## 2019-05-23 ENCOUNTER — Other Ambulatory Visit: Payer: Self-pay

## 2019-05-23 ENCOUNTER — Ambulatory Visit (HOSPITAL_COMMUNITY)
Admission: RE | Admit: 2019-05-23 | Discharge: 2019-05-23 | Disposition: A | Discharge status: Home / Self Care | Payer: BC Managed Care – PPO | Source: Ambulatory Visit | Attending: Hematology and Oncology | Admitting: Hematology and Oncology

## 2019-05-23 DIAGNOSIS — R911 Solitary pulmonary nodule: Secondary | ICD-10-CM | POA: Diagnosis present

## 2019-05-23 DIAGNOSIS — R590 Localized enlarged lymph nodes: Secondary | ICD-10-CM | POA: Insufficient documentation

## 2019-05-23 DIAGNOSIS — Z170 Estrogen receptor positive status [ER+]: Secondary | ICD-10-CM | POA: Diagnosis present

## 2019-05-23 DIAGNOSIS — C50212 Malignant neoplasm of upper-inner quadrant of left female breast: Secondary | ICD-10-CM | POA: Insufficient documentation

## 2019-05-23 LAB — GLUCOSE, CAPILLARY: Glucose-Capillary: 97 mg/dL (ref 70–99)

## 2019-05-23 MED ORDER — FLUDEOXYGLUCOSE F - 18 (FDG) INJECTION
5.5700 | Freq: Once | INTRAVENOUS | Status: AC
  Administered 2019-05-23: 5.57 via INTRAVENOUS

## 2019-05-24 ENCOUNTER — Other Ambulatory Visit: Payer: Self-pay | Admitting: Hematology and Oncology

## 2019-05-24 ENCOUNTER — Telehealth: Payer: Self-pay | Admitting: Hematology and Oncology

## 2019-05-24 DIAGNOSIS — C50919 Malignant neoplasm of unspecified site of unspecified female breast: Secondary | ICD-10-CM

## 2019-05-24 NOTE — Telephone Encounter (Signed)
I discussed with the patient the result of the PET CT scan which showed a very large right lower lobe liver lesion in addition to multiple lymph nodes and bone lesions. She does not appear to have any pain in the sternal lesion with s/p soft tissue tumor. I spoke with Dr. Pascal Lux with interventional radiology.  The plan is to obtain a liver biopsy and also place a port. I will see her after these biopsies and procedures to talk about starting her on treatment.  My plan is to start her on Kadcyla.

## 2019-05-27 ENCOUNTER — Other Ambulatory Visit: Payer: Self-pay | Admitting: Hematology and Oncology

## 2019-05-27 ENCOUNTER — Encounter (HOSPITAL_COMMUNITY): Payer: Self-pay | Admitting: Radiology

## 2019-05-27 ENCOUNTER — Other Ambulatory Visit: Payer: Self-pay | Admitting: *Deleted

## 2019-05-27 DIAGNOSIS — C50919 Malignant neoplasm of unspecified site of unspecified female breast: Secondary | ICD-10-CM

## 2019-05-27 MED ORDER — OXYCODONE-ACETAMINOPHEN 5-325 MG PO TABS: 1.0000 | ORAL_TABLET | Freq: Four times a day (QID) | ORAL | 0 refills | Status: DC | PRN

## 2019-05-27 NOTE — Progress Notes (Signed)
Received call from pt stating she is experiencing sternal pain radiating to her back unrelieved with OTC pain medication.  Per Dr. Lindi Adie pt to start taking percocet 5-325 and to place a referral for radiation.  Referral placed and pt educated on pain medication.  RN also educated pt on taking OTC stool softner to help with any constipation from pain medication.  Pt verbalized understanding.

## 2019-05-27 NOTE — Progress Notes (Unsigned)
Craig Guess. [place] Female, 53 y.o., [birth year] MRN:  [id number] Phone:  [phone] Jerilynn Mages) PCP:  Default, Provider, MD Coverage:  Sherre Poot [employer] Ppo Next Appt With Radiology ([facility]) 05/31/2019 at 2:30 PM  RE: US Liver Biopsy Received: 3 days ago Message Contents  Sandi Mariscal, MD  Garth Bigness D        US guided liver mass Bx.   (I feel the liver mass is a better target than the supraclavicular LN given lack of CT correlate, but could consider supraclavicular LN Bx as indicated.)   Thx,  Ulice Dash   Previous Messages  ----- Message -----  From: Garth Bigness D  Sent: 05/24/2019 12:51 PM EST  To: Sandi Mariscal, MD  Subject: US Liver Biopsy                  Procedure: US Liver Biopsy   Reason:  Metastatic breast cancer, Metastatic breast cancer: Liver biopsy discussed with Dr. Pascal Lux   History:  NM PET, CT, MRI in computer   Dr. Lindi Adie, Loleta Dicker  [phone]

## 2019-05-30 ENCOUNTER — Other Ambulatory Visit: Payer: Self-pay | Admitting: Physician Assistant

## 2019-05-31 ENCOUNTER — Other Ambulatory Visit: Payer: Self-pay

## 2019-05-31 ENCOUNTER — Encounter (HOSPITAL_COMMUNITY): Payer: Self-pay

## 2019-05-31 ENCOUNTER — Ambulatory Visit (HOSPITAL_COMMUNITY)
Admission: RE | Admit: 2019-05-31 | Discharge: 2019-05-31 | Disposition: A | Discharge status: Home / Self Care | Payer: BC Managed Care – PPO | Source: Ambulatory Visit | Attending: Hematology and Oncology | Admitting: Hematology and Oncology

## 2019-05-31 ENCOUNTER — Ambulatory Visit (HOSPITAL_COMMUNITY)
Admission: RE | Admit: 2019-05-31 | Discharge: 2019-05-31 | Disposition: A | Discharge status: Home / Self Care | Payer: BC Managed Care – PPO | Source: Ambulatory Visit

## 2019-05-31 ENCOUNTER — Other Ambulatory Visit: Payer: Self-pay | Admitting: Hematology and Oncology

## 2019-05-31 DIAGNOSIS — J449 Chronic obstructive pulmonary disease, unspecified: Secondary | ICD-10-CM | POA: Insufficient documentation

## 2019-05-31 DIAGNOSIS — F17210 Nicotine dependence, cigarettes, uncomplicated: Secondary | ICD-10-CM | POA: Insufficient documentation

## 2019-05-31 DIAGNOSIS — C50919 Malignant neoplasm of unspecified site of unspecified female breast: Secondary | ICD-10-CM | POA: Insufficient documentation

## 2019-05-31 DIAGNOSIS — K219 Gastro-esophageal reflux disease without esophagitis: Secondary | ICD-10-CM | POA: Insufficient documentation

## 2019-05-31 DIAGNOSIS — Z79899 Other long term (current) drug therapy: Secondary | ICD-10-CM | POA: Insufficient documentation

## 2019-05-31 HISTORY — PX: IR IMAGING GUIDED PORT INSERTION: IMG5740

## 2019-05-31 LAB — CBC
HCT: 33.6 % — ABNORMAL LOW (ref 36.0–46.0)
Hemoglobin: 10.7 g/dL — ABNORMAL LOW (ref 12.0–15.0)
MCH: 27.9 pg (ref 26.0–34.0)
MCHC: 31.8 g/dL (ref 30.0–36.0)
MCV: 87.5 fL (ref 80.0–100.0)
Platelets: 431 10*3/uL — ABNORMAL HIGH (ref 150–400)
RBC: 3.84 MIL/uL — ABNORMAL LOW (ref 3.87–5.11)
RDW: 20.4 % — ABNORMAL HIGH (ref 11.5–15.5)
WBC: 11.2 10*3/uL — ABNORMAL HIGH (ref 4.0–10.5)
nRBC: 0 % (ref 0.0–0.2)

## 2019-05-31 LAB — PROTIME-INR
INR: 0.9 (ref 0.8–1.2)
Prothrombin Time: 12.3 seconds (ref 11.4–15.2)

## 2019-05-31 LAB — APTT: aPTT: 24 seconds (ref 24–36)

## 2019-05-31 MED ORDER — SODIUM CHLORIDE 0.9 % IV SOLN
INTRAVENOUS | Status: DC
  Administered 2019-05-31: 14:00:00 via INTRAVENOUS

## 2019-05-31 MED ORDER — MIDAZOLAM HCL 2 MG/2ML IJ SOLN
INTRAMUSCULAR | Status: AC
  Filled 2019-05-31: qty 4

## 2019-05-31 MED ORDER — HEPARIN SOD (PORK) LOCK FLUSH 100 UNIT/ML IV SOLN
INTRAVENOUS | Status: AC | PRN
  Administered 2019-05-31: 500 [IU] via INTRAVENOUS

## 2019-05-31 MED ORDER — FENTANYL CITRATE (PF) 100 MCG/2ML IJ SOLN
INTRAMUSCULAR | Status: AC | PRN
  Administered 2019-05-31: 25 ug via INTRAVENOUS
  Administered 2019-05-31: 50 ug via INTRAVENOUS

## 2019-05-31 MED ORDER — HEPARIN SOD (PORK) LOCK FLUSH 100 UNIT/ML IV SOLN
INTRAVENOUS | Status: AC
  Filled 2019-05-31: qty 5

## 2019-05-31 MED ORDER — CEFAZOLIN SODIUM-DEXTROSE 2-4 GM/100ML-% IV SOLN
2.0000 g | Freq: Once | INTRAVENOUS | Status: AC
  Administered 2019-05-31: 15:00:00 2 g via INTRAVENOUS

## 2019-05-31 MED ORDER — FENTANYL CITRATE (PF) 100 MCG/2ML IJ SOLN
INTRAMUSCULAR | Status: AC
  Filled 2019-05-31: qty 2

## 2019-05-31 MED ORDER — LIDOCAINE-EPINEPHRINE (PF) 2 %-1:200000 IJ SOLN
INTRAMUSCULAR | Status: AC | PRN
  Administered 2019-05-31: 10 mL

## 2019-05-31 MED ORDER — LIDOCAINE-EPINEPHRINE (PF) 2 %-1:200000 IJ SOLN
INTRAMUSCULAR | Status: AC
  Filled 2019-05-31: qty 20

## 2019-05-31 MED ORDER — LIDOCAINE HCL (PF) 1 % IJ SOLN
INTRAMUSCULAR | Status: AC | PRN
  Administered 2019-05-31: 10 mL

## 2019-05-31 MED ORDER — LIDOCAINE HCL 1 % IJ SOLN
INTRAMUSCULAR | Status: AC
  Filled 2019-05-31: qty 20

## 2019-05-31 MED ORDER — MIDAZOLAM HCL 2 MG/2ML IJ SOLN
INTRAMUSCULAR | Status: AC | PRN
  Administered 2019-05-31 (×3): 1 mg via INTRAVENOUS

## 2019-05-31 MED ORDER — CEFAZOLIN SODIUM-DEXTROSE 2-4 GM/100ML-% IV SOLN
INTRAVENOUS | Status: AC
  Administered 2019-05-31: 2 g via INTRAVENOUS
  Filled 2019-05-31: qty 100

## 2019-05-31 NOTE — Discharge Instructions (Addendum)
Implanted Port Home Guide °An implanted port is a device that is placed under the skin. It is usually placed in the chest. The device can be used to give IV medicine, to take blood, or for dialysis. You may have an implanted port if: °· You need IV medicine that would be irritating to the small veins in your hands or arms. °· You need IV medicines, such as antibiotics, for a long period of time. °· You need IV nutrition for a long period of time. °· You need dialysis. °Having a port means that your health care provider will not need to use the veins in your arms for these procedures. You may have fewer limitations when using a port than you would if you used other types of long-term IVs, and you will likely be able to return to normal activities after your incision heals. °An implanted port has two main parts: °· Reservoir. The reservoir is the part where a needle is inserted to give medicines or draw blood. The reservoir is round. After it is placed, it appears as a small, raised area under your skin. °· Catheter. The catheter is a thin, flexible tube that connects the reservoir to a vein. Medicine that is inserted into the reservoir goes into the catheter and then into the vein. °How is my port accessed? °To access your port: °· A numbing cream may be placed on the skin over the port site. °· Your health care provider will put on a mask and sterile gloves. °· The skin over your port will be cleaned carefully with a germ-killing soap and allowed to dry. °· Your health care provider will gently pinch the port and insert a needle into it. °· Your health care provider will check for a blood return to make sure the port is in the vein and is not clogged. °· If your port needs to remain accessed to get medicine continuously (constant infusion), your health care provider will place a clear bandage (dressing) over the needle site. The dressing and needle will need to be changed every week, or as told by your health care  provider. °What is flushing? °Flushing helps keep the port from getting clogged. Follow instructions from your health care provider about how and when to flush the port. Ports are usually flushed with saline solution or a medicine called heparin. The need for flushing will depend on how the port is used: °· If the port is only used from time to time to give medicines or draw blood, the port may need to be flushed: °? Before and after medicines have been given. °? Before and after blood has been drawn. °? As part of routine maintenance. Flushing may be recommended every 4-6 weeks. °· If a constant infusion is running, the port may not need to be flushed. °· Throw away any syringes in a disposal container that is meant for sharp items (sharps container). You can buy a sharps container from a pharmacy, or you can make one by using an empty hard plastic bottle with a cover. °How long will my port stay implanted? °The port can stay in for as long as your health care provider thinks it is needed. When it is time for the port to come out, a surgery will be done to remove it. The surgery will be similar to the procedure that was done to put the port in. °Follow these instructions at home: ° °· Flush your port as told by your health care provider. °·   If you need an infusion over several days, follow instructions from your health care provider about how to take care of your port site. Make sure you: °? Wash your hands with soap and water before you change your dressing. If soap and water are not available, use alcohol-based hand sanitizer. °? Change your dressing as told by your health care provider. °? Place any used dressings or infusion bags into a plastic bag. Throw that bag in the trash. °? Keep the dressing that covers the needle clean and dry. Do not get it wet. °? Do not use scissors or sharp objects near the tube. °? Keep the tube clamped, unless it is being used. °· Check your port site every day for signs of  infection. Check for: °? Redness, swelling, or pain. °? Fluid or blood. °? Pus or a bad smell. °· Protect the skin around the port site. °? Avoid wearing bra straps that rub or irritate the site. °? Protect the skin around your port from seat belts. Place a soft pad over your chest if needed. °· Bathe or shower as told by your health care provider. The site may get wet as long as you are not actively receiving an infusion. °· Return to your normal activities as told by your health care provider. Ask your health care provider what activities are safe for you. °· Carry a medical alert card or wear a medical alert bracelet at all times. This will let health care providers know that you have an implanted port in case of an emergency. °Get help right away if: °· You have redness, swelling, or pain at the port site. °· You have fluid or blood coming from your port site. °· You have pus or a bad smell coming from the port site. °· You have a fever. °Summary °· Implanted ports are usually placed in the chest for long-term IV access. °· Follow instructions from your health care provider about flushing the port and changing bandages (dressings). °· Take care of the area around your port by avoiding clothing that puts pressure on the area, and by watching for signs of infection. °· Protect the skin around your port from seat belts. Place a soft pad over your chest if needed. °· Get help right away if you have a fever or you have redness, swelling, pain, drainage, or a bad smell at the port site. °This information is not intended to replace advice given to you by your health care provider. Make sure you discuss any questions you have with your health care provider. °Document Released: 06/27/2005 Document Revised: 10/19/2018 Document Reviewed: 07/30/2016 °Elsevier Patient Education © 2020 [employer] °Moderate Conscious Sedation, Adult, Care After °These instructions provide you with information about caring for yourself after  your procedure. Your health care provider may also give you more specific instructions. Your treatment has been planned according to current medical practices, but problems sometimes occur. Call your health care provider if you have any problems or questions after your procedure. °What can I expect after the procedure? °After your procedure, it is common: °· To feel sleepy for several hours. °· To feel clumsy and have poor balance for several hours. °· To have poor judgment for several hours. °· To vomit if you eat too soon. °Follow these instructions at home: °For at least 24 hours after the procedure: ° °· Do not: °? Participate in activities where you could fall or become injured. °? Drive. °? Use heavy machinery. °? Drink alcohol. °?   Take sleeping pills or medicines that cause drowsiness. °? Make important decisions or sign legal documents. °? Take care of children on your own. °· Rest. °Eating and drinking °· Follow the diet recommended by your health care provider. °· If you vomit: °? Drink water, juice, or soup when you can drink without vomiting. °? Make sure you have little or no nausea before eating solid foods. °General instructions °· Have a responsible adult stay with you until you are awake and alert. °· Take over-the-counter and prescription medicines only as told by your health care provider. °· If you smoke, do not smoke without supervision. °· Keep all follow-up visits as told by your health care provider. This is important. °Contact a health care provider if: °· You keep feeling nauseous or you keep vomiting. °· You feel light-headed. °· You develop a rash. °· You have a fever. °Get help right away if: °· You have trouble breathing. °This information is not intended to replace advice given to you by your health care provider. Make sure you discuss any questions you have with your health care provider. °Document Released: 04/17/2013 Document Revised: 06/09/2017 Document Reviewed:  10/17/2015 °Elsevier Patient Education © 2020 [employer] Inc. ° °

## 2019-05-31 NOTE — Consult Note (Signed)
Chief Complaint: Patient was seen in consultation today for Port-A-Cath placement  Referring Physician(s): Gudena,Vinay  Supervising Physician: Markus Daft  Patient Status: [facility] - Out-pt  History of Present Illness: Melissa [name] is a 53 y.o. female smoker with history of stage IV metastatic left breast cancer, initially diagnosed in 2013, status post surgery and chemoradiation.  Also with history of aspergillosis of left lung.  PET scan on 05/23/2019 reveals widespread metastatic disease and she presents today for Port-A-Cath placement for additional treatment.  Past Medical History:  Diagnosis Date   Anemia    Arthritis    knees and hips   Asthma    Breast cancer ([place])    Bronchiectasis ([place])    Bronchiolitis    Cancer ([place])    breast cancer [id number]   Complication of anesthesia    bp dropped + desat    COPD (chronic obstructive pulmonary disease) (H[facility])    Dyspnea    DOE   GERD (gastroesophageal reflux disease)    H/O coccidioidomycosis    was reason for lung lobectomy   Headache(784.0)    due to eye strain or not eating   History of anemia    no current problem   History of asthma    as a child   History of breast cancer 2014   left   History of chemotherapy    finished 07/17/2013   History of hiatal hernia    AGE 52   Hx of radiation therapy 03/25/13-05/06/13   left breast 5000 cGy/25 sessions, left breast boost 1000 cGy/5 sessions   Pneumonia    LAST FLARE UP 01/2018   Runny nose 07/30/2013   clear drainage   Wears dentures    upper    Past Surgical History:  Procedure Laterality Date   APPLICATION OF CRANIAL NAVIGATION N/A 08/04/2017   Procedure: APPLICATION OF CRANIAL NAVIGATION;  Surgeon: [name], [name], MD;  Location: [place];  Service: Neurosurgery;  Laterality: N/A;   AXILLARY LYMPH NODE DISSECTION Left 02/08/2013   Procedure: LEFT AXILLARY DISSECTION;  Surgeon: Edward Jolly, MD;  Location: [place];   Service: General;  Laterality: Left;   BREAST CYST EXCISION Right 2006   BREAST LUMPECTOMY Left 2014   BREAST LUMPECTOMY WITH NEEDLE LOCALIZATION AND AXILLARY SENTINEL LYMPH NODE BX Left 12/31/2012   Procedure: NEEDLE LOCALIZATION LEFT BREAST LUMPECTOMY AND LEFT AXILLARY SENTENIAL LYMPH NODE BX;  Surgeon: Edward Jolly, MD;  Location: [place];  Service: General;  Laterality: Left;   CESAREAN SECTION  1995/1996   CRANIOTOMY Right 08/04/2017   Procedure: Right Frontal craniotomy for resection of tumor with stereotactic navigation;  Surgeon: [name], [name], MD;  Location: [place];  Service: Neurosurgery;  Laterality: Right;  Right Frontal craniotomy for resection of tumor with stereotactic navigation   LUNG LOBECTOMY Left 05/1996   upper lobe - due to [facility] Fever   PORT-A-CATH REMOVAL Right 08/02/2013   Procedure: REMOVAL PORT-A-CATH;  Surgeon: Edward Jolly, MD;  Location: [place];  Service: General;  Laterality: Right;   PORTACATH PLACEMENT  07/02/2012   Procedure: INSERTION PORT-A-CATH;  Surgeon: Edward Jolly, MD;  Location: [place];  Service: General;  Laterality: N/A;  right   VIDEO BRONCHOSCOPY WITH ENDOBRONCHIAL ULTRASOUND N/A 05/23/2018   Procedure: VIDEO BRONCHOSCOPY WITH ENDOBRONCHIAL ULTRASOUND;  Surgeon: Garner Nash, DO;  Location: [place];  Service: Thoracic;  Laterality: N/A;    Allergies: Aspirin, Protonix [pantoprazole], and Iodinated  diagnostic agents  Medications: Prior to Admission medications   Medication Sig Start Date End Date Taking? Authorizing Provider  albuterol (VENTOLIN HFA) 108 (90 BASE) MCG/ACT inhaler Inhale 2 puffs into the lungs every 6 (six) hours as needed. 12/20/12  Yes Causey, Charlestine Massed, NP  budesonide-formoterol (SYMBICORT) 160-4.5 MCG/ACT inhaler Inhale 2 puffs into the lungs 2 (two) times daily.   Yes [provider]  Isavuconazonium Sulfate (CRESEMBA)  186 MG CAPS Take 2 capsules (372 mg total) by mouth 3 (three) times daily. X 2 days. Followed by 2 capsules per day thereafter 04/08/19  Yes Carlyle Basques, MD  lapatinib (TYKERB) 250 MG tablet Take 4 tablets (1,000 mg total) by mouth daily. Take on an empty stomach, at least 1 hour before or 1 hour after meals. 03/20/19  Yes Nicholas Lose, MD  letrozole [facility]) 2.5 MG tablet Take 1 tablet (2.5 mg total) by mouth daily. 07/27/18  Yes Nicholas Lose, MD  levalbuterol (XOPENEX) 0.63 MG/3ML nebulizer solution Take 3 mLs (0.63 mg total) by nebulization every 4 (four) hours as needed for wheezing or shortness of breath. 04/20/18  Yes Martyn Ehrich, NP  naproxen sodium (ALEVE) 220 MG tablet Take 440 mg by mouth 2 (two) times daily as needed (headache).    Yes [provider]  oxyCODONE-acetaminophen (PERCOCET/ROXICET) 5-325 MG tablet Take 1 tablet by mouth every 6 (six) hours as needed for severe pain. 05/27/19  Yes Nicholas Lose, MD  VIRTUSSIN A/C 100-10 MG/5ML syrup TK 5 MLS PO EVERY 8 HOURS 03/11/19  Yes [provider]  Chlorpheniramine-Codeine 2-10 MG/5ML LIQD Take 5 mLs by mouth every 6 (six) hours as needed. Patient not taking: Reported on 05/01/2019 03/06/19   Carlyle Basques, MD  HYDROcodone-guaiFENesin 2.5-200 MG/5ML SOLN Take 5 mLs by mouth every 8 (eight) hours. Patient not taking: Reported on 05/01/2019 03/11/19   Carlyle Basques, MD  predniSONE (DELTASONE) 50 MG tablet Take for contrast allergy prior to study. Take one tablet 13 hours before study, one tablet 7 hours before study,and one tablet 1 hour before study Patient not taking: Reported on 05/01/2019 01/30/19   Nicholas Lose, MD     Family History  Problem Relation Age of Onset   Emphysema Mother        was a smoker   Heart disease Mother    Melanoma Mother        dx in her 71s   Breast cancer Mother 52   Asthma Brother    Breast cancer Cousin        mother's maternal cousin; dx in her 38s    Social  History   Socioeconomic History   Marital status: Married    Spouse name: Not on file   Number of children: 2   Years of education: Not on file   Highest education level: Not on file  Occupational History   Occupation: Public librarian strain: Not on file   Food insecurity    Worry: Not on file    Inability: Not on file   Transportation needs    Medical: Not on file    Non-medical: Not on file  Tobacco Use   Smoking status: Current Every Day Smoker    Packs/day: 0.75    Years: 24.00    Pack years: 18.00    Types: Cigarettes   Smokeless tobacco: Never Used  Substance and Sexual Activity   Alcohol use: No   Drug use: No   Sexual activity: Not  Currently  Lifestyle   Physical activity    Days per week: Not on file    Minutes per session: Not on file   Stress: Not on file  Relationships   Social connections    Talks on phone: Not on file    Gets together: Not on file    Attends religious service: Not on file    Active member of club or organization: Not on file    Attends meetings of clubs or organizations: Not on file    Relationship status: Not on file  Other Topics Concern   Not on file  Social History Narrative   Not on file      Review of Systems currently denies fever, headache, worsening dyspnea, abdominal/back pain, nausea, vomiting or bleeding.  She does have anterior lower chest discomfort with radiation to left ribs, intermittent abdominal pain, occ cough.  Vital Signs: BP 99/70 (BP Location: Left Arm)    Pulse 92    Temp 98.1 F (36.7 C) (Oral)    Resp 18    LMP 07/25/2012 Comment: pregnancy waiver form signed 01-18-2018   SpO2 100%   Physical Exam awake, alert.  Chest with few inspiratory wheezes.  Heart with regular rate / rhythm.  Abdomen soft, positive bowel sounds, nontender.  No lower extremity edema.  Imaging: Ct Chest W Contrast  Result Date: 05/09/2019 CLINICAL DATA:  Bronchiectasis, acute  exacerbation, chest pain coughing, productive phlegm, history of left-sided breast cancer treated with lumpectomy in 2014, history of left upper lobectomy at age 87 due to fungal infection. EXAM: CT CHEST WITH CONTRAST TECHNIQUE: Multidetector CT imaging of the chest was performed during intravenous contrast administration. CONTRAST:  34mL OMNIPAQUE IOHEXOL 300 MG/ML  SOLN COMPARISON:  01/31/2019 FINDINGS: Cardiovascular: No significant vascular findings. Normal heart size. No pericardial effusion. Mediastinum/Nodes: Interval increase in size of enhancing nodular left internal mammary soft tissue, measuring approximately 2.8 x 1.4 cm in aggregate, previously 2.1 x 1.2 cm when measured similarly (series 2, image 38). Redemonstrated enlarged supraclavicular, lower cervical, and left posterior cervical lymph nodes, which do not appear to be changed in size (series 2, image 9, 5). Thyroid gland, trachea, and esophagus demonstrate no significant findings. Lungs/Pleura: Status post left upper lobectomy. Redemonstrated bronchiectatic and cavitary changes of the superior left lower lobe. The dominant cavity of the left lower lobe has increased in size, now with increased nodular soft tissue in the dependent portion (series 7, image 39). Mild paraseptal emphysema of the lungs. Upper Abdomen: No acute abnormality. Musculoskeletal: There is interval increase in bony erosion of the posterior and lateral left third rib adjacent to pleural soft tissue (series 2, image 27). Interval increase in soft tissue lesion eroding the left aspect of the sternal body at the level of the left third costochondral junction, soft tissue nodule measuring at least 3.1 x 2.2 cm, previously 2.5 x 2.0 cm when measured similarly (series 2, image 58). Postoperative findings of left lumpectomy and axillary lymph node dissection. IMPRESSION: 1. Interval increase in size of enhancing nodular left internal mammary soft tissue, measuring approximately 2.8 x  1.4 cm in aggregate, previously 2.1 x 1.2 cm when measured similarly (series 2, image 38). Redemonstrated enlarged supraclavicular, lower cervical, and left posterior cervical lymph nodes, which do not appear to be changed in size (series 2, image 9, 5). 2. There is interval increase in bony erosion of the posterior and lateral left third rib adjacent to pleural soft tissue (series 2, image 27). Interval increase in  soft tissue lesion eroding the left aspect of the sternal body at the level of the left third costochondral junction, soft tissue nodule measuring at least 3.1 x 2.2 cm, previously 2.5 x 2.0 cm when measured similarly (series 2, image 58). 3. Above constellation of findings is consistent with malignant recurrence. 4. Status post left upper lobectomy. Redemonstrated bronchiectatic and cavitary changes of the superior left lower lobe. The dominant cavity of the left lower lobe has increased in size, now with increased nodular soft tissue in the dependent portion (series 7, image 39). There may be fungal superinfection of this cavity (i.e. aspergilloma). 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 5.  Status post left lumpectomy and axillary lymph node resection. 6.  Emphysema (ICD10-J43.9). These results will be called to the ordering clinician or representative by the Radiologist Assistant, and communication documented in the PACS or zVision Dashboard. Electronically Signed   By: Eddie Candle M.D.   On: 05/09/2019 12:22   Nm Pet Image Initial (pi) Skull Base To Thigh  Result Date: 05/23/2019 CLINICAL DATA:  Subsequent treatment strategy for stage IV breast cancer. Evaluate thoracic lesions for malignancy versus Aspergillus. Fungal infection of left lung. EXAM: NUCLEAR MEDICINE PET SKULL BASE TO THIGH TECHNIQUE: 5.5 mCi F-18 FDG was injected intravenously. Full-ring PET imaging was performed from the skull base to thigh after the  radiotracer. CT data was obtained and used for attenuation correction and anatomic localization. Fasting blood glucose: 97 mg/dl COMPARISON:  Chest CT 05/09/2019.  Abdominopelvic CT 01/18/2018 FINDINGS: Mediastinal blood pool activity: SUV max 2.3 Liver activity: SUV max NA NECK: Bilateral low cervical and supraclavicular nodal metastasis. Index right low jugular node measures 8 mm and a S.U.V. max of 15.0 on 44/4. Left supraclavicular node measures 9 mm and a S.U.V. max of 8.5 on 41/4. Incidental CT findings: none CHEST: 1.4 cm a prevascular node measures a S.U.V. max of 14.1 on 61/4. More mild left sided mediastinal hypermetabolism, including at a S.U.V. max of 4.1 on 63/4. Left upper lung peripherally hypermetabolic cavitary lesion is likely infectious/inflammatory. This measures on the order of 5.8 cm and a S.U.V. max of 7.6 on 58/4. There are also hypermetabolic pulmonary nodular opacities which are also favored to be infectious or inflammatory but technically indeterminate. Example in the left lower lobe medially at a S.U.V. max of 5.1 on 68/4. Incidental CT findings: Deferred to recent diagnostic CT. ABDOMEN/PELVIS: Portocaval hypermetabolism is likely nodal, but difficult to delineate secondary to paucity of intra-abdominal fat in this region. Example 9 mm and a S.U.V. max of 16.1 on 113/4. Inferior right hepatic lobe mass measures 6.5 x 6.4 cm and a S.U.V. max of 13.9 on 122/4. Incidental CT findings: Normal adrenal glands. Abdominal aortic atherosclerosis. SKELETON: Hypermetabolism corresponding to the destructive lesion involving the posterior and posterolateral left third rib. This measures a S.U.V. max of 16.4, including on 50/4. Lytic sternal body and surrounding soft tissue lesion measures a S.U.V. max of 17.0, eccentric left, on 70/4. Incidental CT findings: none IMPRESSION: 1. Widespread metastatic disease, including to liver, bones, nodal stations of the chest, abdomen, and low neck. 2.  Hypermetabolic pulmonary lesions, favored to be infectious/inflammatory. The more nodular areas, including within the the left lower lobe medially, are technically indeterminate. 3.  Aortic Atherosclerosis (ICD10-I70.0). Electronically Signed   By: Abigail Miyamoto M.D.   On: 05/23/2019 11:11    Labs:  CBC: Recent Labs    12/10/18 0934  03/11/19 1057 05/14/19 0917 05/31/19 1318  WBC 9.9 13.3* 10.5 11.2*  HGB 11.5* 9.7* 10.3* 10.7*  HCT 35.9 30.4* 32.4* 33.6*  PLT 491* 488* 425* 431*    COAGS: No results for input(s): INR, APTT in the last 8760 hours.  BMP: Recent Labs    10/04/18 1409 11/05/18 0935 12/10/18 0934 03/11/19 1057 05/14/19 0917  NA 141 141 140 140 138  K 5.2 4.7 5.6* 4.7 3.9  CL 104 105 106 104 102  CO2 26 28 22 28 25   GLUCOSE 83 87 87 87 87  BUN 18 9 12 8 10   CALCIUM 10.0 10.2 10.1 9.8 10.0  CREATININE 0.91 0.99 0.92 0.88 1.01*  GFRNONAA 74  --  73 76 >60  GFRAA 85  --  84 88 >60    LIVER FUNCTION TESTS: Recent Labs    07/27/18 1120  11/05/18 0935 12/10/18 0934 03/11/19 1057 05/14/19 0917  BILITOT <0.2*   < > 0.3 0.3 0.3 <0.2*  AST 13*   < > 15 28 14  13*  ALT 23   < > 13 20 12 9   ALKPHOS 147*  --   --   --   --  145*  PROT 8.0   < > 7.5 7.8 7.3 8.2*  ALBUMIN 3.6  --   --   --   --  3.5   < > = values in this interval not displayed.    TUMOR MARKERS: No results for input(s): AFPTM, CEA, CA199, CHROMGRNA in the last 8760 hours.  Assessment and Plan: 53 y.o. female smoker with history of stage IV metastatic left breast cancer, initially diagnosed in 2013, status post surgery and chemoradiation.  Also with history of aspergillosis of left lung.  PET scan on 05/23/2019 reveals widespread metastatic disease and she presents today for Port-A-Cath placement for additional treatment.Risks and benefits of image guided port-a-catheter placement was discussed with the patient including, but not limited to bleeding, infection, pneumothorax, or fibrin sheath  development and need for additional procedures.  All of the patient's questions were answered, patient is agreeable to proceed. Consent signed and in chart.     Thank you for this interesting consult.  I greatly enjoyed meeting Melissa [name] and look forward to participating in their care.  A copy of this report was sent to the requesting provider on this date.  Electronically Signed: D. Rowe Robert, PA-C 05/31/2019, 1:58 PM   I spent a total of  25 minutes   in face to face in clinical consultation, greater than 50% of which was counseling/coordinating care for Port-A-Cath placement

## 2019-05-31 NOTE — Procedures (Signed)
Interventional Radiology Procedure:   Indications: Metastatic breast cancer  Procedure: Port placement  Findings: Right jugular port, tip at SVC/RA junction  Complications: None     EBL: Minimal, less than 10 ml  Plan: Discharge in one hour.  Keep port site and incisions dry for at least 24 hours.     Autry Droege R. Wendell Fiebig, MD  Pager: [phone]    

## 2019-06-03 ENCOUNTER — Other Ambulatory Visit: Payer: Self-pay | Admitting: *Deleted

## 2019-06-03 ENCOUNTER — Other Ambulatory Visit: Payer: Self-pay | Admitting: Radiology

## 2019-06-03 ENCOUNTER — Ambulatory Visit (HOSPITAL_COMMUNITY): Payer: BC Managed Care – PPO

## 2019-06-03 DIAGNOSIS — Z95828 Presence of other vascular implants and grafts: Secondary | ICD-10-CM

## 2019-06-03 DIAGNOSIS — C50919 Malignant neoplasm of unspecified site of unspecified female breast: Secondary | ICD-10-CM

## 2019-06-03 MED ORDER — LIDOCAINE-PRILOCAINE 2.5-2.5 % EX CREA: TOPICAL_CREAM | Freq: Once | CUTANEOUS | Status: DC

## 2019-06-05 ENCOUNTER — Other Ambulatory Visit: Payer: Self-pay

## 2019-06-05 ENCOUNTER — Ambulatory Visit (HOSPITAL_COMMUNITY)
Admission: RE | Admit: 2019-06-05 | Discharge: 2019-06-05 | Disposition: A | Discharge status: Home / Self Care | Payer: BC Managed Care – PPO | Source: Ambulatory Visit

## 2019-06-05 ENCOUNTER — Encounter (HOSPITAL_COMMUNITY): Payer: Self-pay

## 2019-06-05 ENCOUNTER — Ambulatory Visit (HOSPITAL_COMMUNITY)
Admission: RE | Admit: 2019-06-05 | Discharge: 2019-06-05 | Disposition: A | Discharge status: Home / Self Care | Payer: Self-pay | Source: Ambulatory Visit | Attending: Hematology and Oncology | Admitting: Hematology and Oncology

## 2019-06-05 DIAGNOSIS — Z886 Allergy status to analgesic agent status: Secondary | ICD-10-CM | POA: Insufficient documentation

## 2019-06-05 DIAGNOSIS — C50919 Malignant neoplasm of unspecified site of unspecified female breast: Secondary | ICD-10-CM

## 2019-06-05 DIAGNOSIS — F17200 Nicotine dependence, unspecified, uncomplicated: Secondary | ICD-10-CM | POA: Insufficient documentation

## 2019-06-05 DIAGNOSIS — C7981 Secondary malignant neoplasm of breast: Secondary | ICD-10-CM | POA: Insufficient documentation

## 2019-06-05 DIAGNOSIS — Z79899 Other long term (current) drug therapy: Secondary | ICD-10-CM | POA: Insufficient documentation

## 2019-06-05 DIAGNOSIS — Z79811 Long term (current) use of aromatase inhibitors: Secondary | ICD-10-CM | POA: Insufficient documentation

## 2019-06-05 DIAGNOSIS — Z91041 Radiographic dye allergy status: Secondary | ICD-10-CM | POA: Insufficient documentation

## 2019-06-05 DIAGNOSIS — J449 Chronic obstructive pulmonary disease, unspecified: Secondary | ICD-10-CM | POA: Insufficient documentation

## 2019-06-05 DIAGNOSIS — Z888 Allergy status to other drugs, medicaments and biological substances status: Secondary | ICD-10-CM | POA: Insufficient documentation

## 2019-06-05 DIAGNOSIS — M1610 Unilateral primary osteoarthritis, unspecified hip: Secondary | ICD-10-CM | POA: Insufficient documentation

## 2019-06-05 DIAGNOSIS — Z9221 Personal history of antineoplastic chemotherapy: Secondary | ICD-10-CM | POA: Insufficient documentation

## 2019-06-05 DIAGNOSIS — Z923 Personal history of irradiation: Secondary | ICD-10-CM | POA: Insufficient documentation

## 2019-06-05 DIAGNOSIS — M1710 Unilateral primary osteoarthritis, unspecified knee: Secondary | ICD-10-CM | POA: Insufficient documentation

## 2019-06-05 LAB — CBC WITH DIFFERENTIAL/PLATELET
Abs Immature Granulocytes: 0.02 10*3/uL (ref 0.00–0.07)
Basophils Absolute: 0.1 10*3/uL (ref 0.0–0.1)
Basophils Relative: 1 %
Eosinophils Absolute: 0.5 10*3/uL (ref 0.0–0.5)
Eosinophils Relative: 5 %
HCT: 31.5 % — ABNORMAL LOW (ref 36.0–46.0)
Hemoglobin: 10 g/dL — ABNORMAL LOW (ref 12.0–15.0)
Immature Granulocytes: 0 %
Lymphocytes Relative: 18 %
Lymphs Abs: 1.9 10*3/uL (ref 0.7–4.0)
MCH: 27.8 pg (ref 26.0–34.0)
MCHC: 31.7 g/dL (ref 30.0–36.0)
MCV: 87.5 fL (ref 80.0–100.0)
Monocytes Absolute: 0.6 10*3/uL (ref 0.1–1.0)
Monocytes Relative: 6 %
Neutro Abs: 7.3 10*3/uL (ref 1.7–7.7)
Neutrophils Relative %: 70 %
Platelets: 329 10*3/uL (ref 150–400)
RBC: 3.6 MIL/uL — ABNORMAL LOW (ref 3.87–5.11)
RDW: 21 % — ABNORMAL HIGH (ref 11.5–15.5)
WBC: 10.4 10*3/uL (ref 4.0–10.5)
nRBC: 0 % (ref 0.0–0.2)

## 2019-06-05 LAB — COMPREHENSIVE METABOLIC PANEL
ALT: 12 U/L (ref 0–44)
AST: 18 U/L (ref 15–41)
Albumin: 3.7 g/dL (ref 3.5–5.0)
Alkaline Phosphatase: 118 U/L (ref 38–126)
Anion gap: 10 (ref 5–15)
BUN: 12 mg/dL (ref 6–20)
CO2: 24 mmol/L (ref 22–32)
Calcium: 9.3 mg/dL (ref 8.9–10.3)
Chloride: 104 mmol/L (ref 98–111)
Creatinine, Ser: 0.98 mg/dL (ref 0.44–1.00)
GFR calc Af Amer: >60 mL/min (ref >60)
GFR calc non Af Amer: >60 mL/min (ref >60)
Glucose, Bld: 89 mg/dL (ref 70–99)
Potassium: 4.1 mmol/L (ref 3.5–5.1)
Sodium: 138 mmol/L (ref 135–145)
Total Bilirubin: 0.6 mg/dL (ref 0.3–1.2)
Total Protein: 7.4 g/dL (ref 6.5–8.1)

## 2019-06-05 MED ORDER — LIDOCAINE HCL 1 % IJ SOLN
INTRAMUSCULAR | Status: AC
  Filled 2019-06-05: qty 10

## 2019-06-05 MED ORDER — MIDAZOLAM HCL 2 MG/2ML IJ SOLN
INTRAMUSCULAR | Status: AC
  Filled 2019-06-05: qty 4

## 2019-06-05 MED ORDER — MIDAZOLAM HCL 2 MG/2ML IJ SOLN
INTRAMUSCULAR | Status: AC | PRN
  Administered 2019-06-05 (×3): 1 mg via INTRAVENOUS

## 2019-06-05 MED ORDER — SODIUM CHLORIDE 0.9 % IV SOLN
INTRAVENOUS | Status: DC
  Administered 2019-06-05: 12:00:00 via INTRAVENOUS

## 2019-06-05 MED ORDER — GELATIN ABSORBABLE 12-7 MM EX MISC
CUTANEOUS | Status: AC
  Filled 2019-06-05: qty 1

## 2019-06-05 MED ORDER — HEPARIN SOD (PORK) LOCK FLUSH 100 UNIT/ML IV SOLN
500.0000 [IU] | Freq: Once | INTRAVENOUS | Status: AC
  Administered 2019-06-05: 16:00:00 500 [IU] via INTRAVENOUS
  Filled 2019-06-05: qty 5

## 2019-06-05 MED ORDER — FENTANYL CITRATE (PF) 100 MCG/2ML IJ SOLN
INTRAMUSCULAR | Status: AC
  Filled 2019-06-05: qty 2

## 2019-06-05 MED ORDER — FENTANYL CITRATE (PF) 100 MCG/2ML IJ SOLN
INTRAMUSCULAR | Status: AC | PRN
  Administered 2019-06-05: 50 ug via INTRAVENOUS

## 2019-06-05 NOTE — Procedures (Signed)
Pre Procedure Dx: Liver lesion worrisome for metastatic breast cancer.  Post Procedural Dx: Same  Technically successful US guided biopsy of mass within the caudal aspect of the right lobe of the liver.  EBL: None No immediate complications.   Ronny Bacon, MD Pager #: [phone]

## 2019-06-05 NOTE — H&P (Signed)
Referring Physician(s): [id number]  Supervising Physician: Sandi Mariscal  Patient Status:  [facility] OP  Chief Complaint:  "I'm having a biopsy"  Subjective: Patient familiar to IR service from recent Port-A-Cath placement on 05/31/2019.  She is a 53 year old female smoker with history of stage IV metastatic left breast cancer, initially diagnosed in 2013, status post surgery and chemoradiation.  Also with history of aspergillosis of left lung.  PET scan on 05/23/2019 revealed widespread metastatic disease and she presents again today for image guided biopsy of liver lesion versus supraclavicular lymph node.  She currently denies fever, chest pain, dyspnea, cough, abdominal/back pain, nausea, vomiting.  She does have a mild headache and some occasional blood-tinged sputum.  Past Medical History:  Diagnosis Date  . Anemia   . Arthritis    knees and hips  . Asthma   . Breast cancer ([place])   . Bronchiectasis ([place])   . Bronchiolitis   . Cancer [facility])    breast cancer 2014  . Complication of anesthesia    bp dropped + desat   . COPD (chronic obstructive pulmonary disease) ([place])   . Dyspnea    DOE  . GERD (gastroesophageal reflux disease)   . H/O coccidioidomycosis    was reason for lung lobectomy  . Headache(784.0)    due to eye strain or not eating  . History of anemia    no current problem  . History of asthma    as a child  . History of breast cancer 2014   left  . History of chemotherapy    finished 07/17/2013  . History of hiatal hernia    AGE 12  . Hx of radiation therapy 03/25/13-05/06/13   left breast 5000 cGy/25 sessions, left breast boost 1000 cGy/5 sessions  . Pneumonia    LAST FLARE UP 01/2018  . Runny nose 07/30/2013   clear drainage  . Wears dentures    upper   Past Surgical History:  Procedure Laterality Date  . APPLICATION OF CRANIAL NAVIGATION N/A 08/04/2017   Procedure: APPLICATION OF CRANIAL NAVIGATION;  Surgeon: [name], [name], MD;  Location: [place];  Service: Neurosurgery;  Laterality: N/A;  . AXILLARY LYMPH NODE DISSECTION Left 02/08/2013   Procedure: LEFT AXILLARY DISSECTION;  Surgeon: Edward Jolly, MD;  [place];  Service: General;  Laterality: Left;  . BREAST CYST EXCISION Right 2006  . BREAST LUMPECTOMY Left 2014  . BREAST LUMPECTOMY WITH NEEDLE LOCALIZATION AND AXILLARY SENTINEL LYMPH NODE BX Left 12/31/2012   Procedure: NEEDLE LOCALIZATION LEFT BREAST LUMPECTOMY AND LEFT AXILLARY SENTENIAL LYMPH NODE BX;  Surgeon: Edward Jolly, MD;  Location: [place];  Service: General;  Laterality: Left;  . CESAREAN SECTION  1995/1996  . CRANIOTOMY Right 08/04/2017   Procedure: Right Frontal craniotomy for resection of tumor with stereotactic navigation;  Surgeon: [name], [name], MD;  Location: [place];  Service: Neurosurgery;  Laterality: Right;  Right Frontal craniotomy for resection of tumor with stereotactic navigation  . IR IMAGING GUIDED PORT INSERTION  05/31/2019  . LUNG LOBECTOMY Left 05/1996   upper lobe - due to [facility] Fever  . PORT-A-CATH REMOVAL Right 08/02/2013   Procedure: REMOVAL PORT-A-CATH;  Surgeon: Edward Jolly, MD;  Location: [place];  Service: General;  Laterality: Right;  . PORTACATH PLACEMENT  07/02/2012   Procedure: INSERTION PORT-A-CATH;  Surgeon: Edward Jolly, MD;  Location: [place];  Service: General;  Laterality: N/A;  right  .  VIDEO BRONCHOSCOPY WITH ENDOBRONCHIAL ULTRASOUND N/A 05/23/2018   Procedure: VIDEO BRONCHOSCOPY WITH ENDOBRONCHIAL ULTRASOUND;  Surgeon: Garner Nash, DO;  Location[facility];  Service: Thoracic;  Laterality: N/A;      Allergies: Aspirin, Protonix [pantoprazole], and Iodinated diagnostic agents  Medications: Prior to Admission medications   Medication Sig Start Date End Date Taking? Authorizing Provider  albuterol (VENTOLIN HFA) 108 (90 BASE) MCG/ACT inhaler Inhale 2 puffs into  the lungs every 6 (six) hours as needed. 12/20/12   Gardenia Phlegm, NP  budesonide-formoterol (SYMBICORT) 160-4.5 MCG/ACT inhaler Inhale 2 puffs into the lungs 2 (two) times daily.    [provider]  Chlorpheniramine-Codeine 2-10 MG/5ML LIQD Take 5 mLs by mouth every 6 (six) hours as needed. Patient not taking: Reported on 05/01/2019 03/06/19   Carlyle Basques, MD  HYDROcodone-guaiFENesin 2.5-200 MG/5ML SOLN Take 5 mLs by mouth every 8 (eight) hours. Patient not taking: Reported on 05/01/2019 03/11/19   Carlyle Basques, MD  Isavuconazonium Sulfate (CRESEMBA) 186 MG CAPS Take 2 capsules (372 mg total) by mouth 3 (three) times daily. X 2 days. Followed by 2 capsules per day thereafter 04/08/19   Carlyle Basques, MD  lapatinib (TYKERB) 250 MG tablet Take 4 tablets (1,000 mg total) by mouth daily. Take on an empty stomach, at least 1 hour before or 1 hour after meals. 03/20/19   Nicholas Lose, MD  letrozole (FEMARA) 2.5 MG tablet Take 1 tablet (2.5 mg total) by mouth daily. 07/27/18   Nicholas Lose, MD  levalbuterol (XOPENEX) 0.63 MG/3ML nebulizer solution Take 3 mLs (0.63 mg total) by nebulization every 4 (four) hours as needed for wheezing or shortness of breath. 04/20/18   Martyn Ehrich, NP  naproxen sodium (ALEVE) 220 MG tablet Take 440 mg by mouth 2 (two) times daily as needed (headache).     [provider]  oxyCODONE-acetaminophen (PERCOCET/ROXICET) 5-325 MG tablet Take 1 tablet by mouth every 6 (six) hours as needed for severe pain. 05/27/19   Nicholas Lose, MD  predniSONE (DELTASONE) 50 MG tablet Take for contrast allergy prior to study. Take one tablet 13 hours before study, one tablet 7 hours before study,and one tablet 1 hour before study Patient not taking: Reported on 05/01/2019 01/30/19   Nicholas Lose, MD  VIRTUSSIN A/C 100-10 MG/5ML syrup TK 5 MLS PO EVERY 8 HOURS 03/11/19   [provider]     Vital Signs: VSS; afebrile LMP 07/25/2012 Comment:  pregnancy waiver form signed 01-18-2018  Physical Exam awake, alert.  Chest with few inspiratory wheezes.  Clean, intact right chest wall Port-A-Cath.  Heart with regular rate and rhythm.  Abdomen soft, positive bowel sounds, nontender.  No lower extremity edema.  Imaging: No results found.  Labs:  CBC: Recent Labs    12/10/18 0934 03/11/19 1057 05/14/19 0917 05/31/19 1318  WBC 9.9 13.3* 10.5 11.2*  HGB 11.5* 9.7* 10.3* 10.7*  HCT 35.9 30.4* 32.4* 33.6*  PLT 491* 488* 425* 431*    COAGS: Recent Labs    05/31/19 1318  INR 0.9  APTT 24    BMP: Recent Labs    10/04/18 1409 11/05/18 0935 12/10/18 0934 03/11/19 1057 05/14/19 0917  NA 141 141 140 140 138  K 5.2 4.7 5.6* 4.7 3.9  CL 104 105 106 104 102  CO2 26 28 22 28 25   GLUCOSE 83 87 87 87 87  BUN 18 9 12 8 10   CALCIUM 10.0 10.2 10.1 9.8 10.0  CREATININE 0.91 0.99 0.92 0.88 1.01*  GFRNONAA 74  --  73 76 >60  GFRAA 85  --  84 88 >60    LIVER FUNCTION TESTS: Recent Labs    07/27/18 1120  11/05/18 0935 12/10/18 0934 03/11/19 1057 05/14/19 0917  BILITOT <0.2*   < > 0.3 0.3 0.3 <0.2*  AST 13*   < > 15 28 14  13*  ALT 23   < > 13 20 12 9   ALKPHOS 147*  --   --   --   --  145*  PROT 8.0   < > 7.5 7.8 7.3 8.2*  ALBUMIN 3.6  --   --   --   --  3.5   < > = values in this interval not displayed.    Assessment and Plan: 53 year old female smoker with history of stage IV metastatic left breast cancer, initially diagnosed in 2013, status post surgery and chemoradiation.  Also with history of aspergillosis of left lung.  PET scan on 05/23/2019 revealed widespread metastatic disease and she presents again today for image guided biopsy of liver lesion versus supraclavicular lymph node. Risks and benefits of procedure was discussed with the patient  including, but not limited to bleeding, infection, damage to adjacent structures or low yield requiring additional tests.  All of the questions were answered and there is  agreement to proceed.  Consent signed and in chart.  LABS PENDING   Electronically Signed: D. Rowe Robert, PA-C 06/05/2019, 11:48 AM   I spent a total of 20 minutes  at the the patient's bedside AND on the patient's hospital floor or unit, greater than 50% of which was counseling/coordinating care for image guided biopsy of liver lesion versus supraclavicular lymph node

## 2019-06-05 NOTE — Discharge Instructions (Signed)
Emergency on call IR MD day of procedure  [phone]  Moderate Conscious Sedation, Adult, Care After These instructions provide you with information about caring for yourself after your procedure. Your health care provider may also give you more specific instructions. Your treatment has been planned according to current medical practices, but problems sometimes occur. Call your health care provider if you have any problems or questions after your procedure. What can I expect after the procedure? After your procedure, it is common:  To feel sleepy for several hours.  To feel clumsy and have poor balance for several hours.  To have poor judgment for several hours.  To vomit if you eat too soon. Follow these instructions at home: For at least 24 hours after the procedure:  Do not: ? Participate in activities where you could fall or become injured. ? Drive. ? Use heavy machinery. ? Drink alcohol. ? Take sleeping pills or medicines that cause drowsiness. ? Make important decisions or sign legal documents. ? Take care of children on your own.  Rest. Eating and drinking  Follow the diet recommended by your health care provider.  If you vomit: ? Drink water, juice, or soup when you can drink without vomiting. ? Make sure you have little or no nausea before eating solid foods. General instructions  Have a responsible adult stay with you until you are awake and alert.  Take over-the-counter and prescription medicines only as told by your health care provider.  If you smoke, do not smoke without supervision.  Keep all follow-up visits as told by your health care provider. This is important. Contact a health care provider if:  You keep feeling nauseous or you keep vomiting.  You feel light-headed.  You develop a rash.  You have a fever. Get help right away if:  You have trouble breathing. This information is not intended to replace advice given to you by your health care  provider. Make sure you discuss any questions you have with your health care provider. Document Released: 04/17/2013 Document Revised: 06/09/2017 Document Reviewed: 10/17/2015 Elsevier Patient Education  2020 [place].   Liver Biopsy, Care After These instructions give you information about how to care for yourself after your procedure. Your health care provider may also give you more specific instructions. If you have problems or questions, contact your health care provider. What can I expect after the procedure? After your procedure, it is common to have:  Pain and soreness in the area where the biopsy was done.  Bruising around the area where the biopsy was done.  Sleepiness and fatigue for 1-2 days. Follow these instructions at home: Medicines  Take over-the-counter and prescription medicines only as told by your health care provider.  If you were prescribed an antibiotic medicine, take it as told by your health care provider. Do not stop taking the antibiotic even if you start to feel better.  Do not take medicines such as aspirin and ibuprofen unless your health care provider tells you to take them. These medicines thin your blood and can increase the risk of bleeding.  If you are taking prescription pain medicine, take actions to prevent or treat constipation. Your health care provider may recommend that you: ? Drink enough fluid to keep your urine pale yellow. ? Eat foods that are high in fiber, such as fresh fruits and vegetables, whole grains, and beans. ? Limit foods that are high in fat and processed sugars, such as fried or sweet foods. ? Take an  over-the-counter or prescription medicine for constipation. Incision care  Follow instructions from your health care provider about how to take care of your incision. Make sure you: ? Wash your hands with soap and water before you change your bandage (dressing). If soap and water are not available, use hand  sanitizer. ? Change your dressing as told by your health care provider.  May remove bandaid and shower in 24 hours.  Keep site site clean and dry, replace bandaid as necessary. ? Leave stitches (sutures), skin glue, or adhesive strips in place. These skin closures may need to stay in place for 2 weeks or longer. If adhesive strip edges start to loosen and curl up, you may trim the loose edges. Do not remove adhesive strips completely unless your health care provider tells you to do that.  Check your incision area every day for signs of infection. Check for: ? Redness, swelling, or pain. ? Fluid or blood. ? Warmth. ? Pus or a bad smell.  Do not take baths, swim, or use a hot tub until your health care provider says it is okay to do so. Activity  Rest at home for 1-2 days, or as directed by your health care provider. ? Avoid sitting for a long time without moving. Get up to take short walks every 1-2 hours. This is important to improve blood flow and breathing. Ask for help if you feel weak or unsteady.  Return to your normal activities as told by your health care provider. Ask your health care provider what activities are safe for you.  Do not drive or use heavy machinery while taking prescription pain medicine.  Do not lift anything that is heavier than 10 lb (4.5 kg), or the limit that your health care provider tells you, until he or she says that it is safe.  Do not play contact sports for 2 weeks after the procedure. General instructions  Do not drink alcohol in the first week after the procedure.  Have someone stay with you for at least 24 hours after the procedure.  It is your responsibility to obtain your test results. Ask your health care provider, or the department that is doing the test: ? When will my results be ready? ? How will I get my results? ? What are my treatment options? ? What other tests do I need? ? What are my next steps?  Keep all follow-up visits as told  by your health care provider. This is important. Contact a health care provider if:  You have increased bleeding from an incision, resulting in more than a small spot of blood.  You have redness, swelling, or increasing pain in any incisions.  You notice a discharge or a bad smell coming from any of your incisions.  You have a fever or chills. Get help right away if:  You develop swelling, bloating, or pain in your abdomen.  You become dizzy or faint.  You develop a rash.  You have nausea or you vomit.  You faint, or you have shortness of breath or difficulty breathing.  You develop chest pain.  You have problems with your speech or vision.  You have trouble with your balance or moving your arms or legs. Summary  After the liver biopsy, it is common to have pain, soreness, and bruising in the area, as well as sleepiness and fatigue.  Take over-the-counter and prescription medicines only as told by your health care provider.  Follow instructions from your health care provider  about how to care for your incision. Check the incision area daily for signs of infection. This information is not intended to replace advice given to you by your health care provider. Make sure you discuss any questions you have with your health care provider. Document Released: 01/14/2005 Document Revised: 08/20/2018 Document Reviewed: 07/07/2017 Elsevier Patient Education  2020 [employer].

## 2019-06-07 ENCOUNTER — Other Ambulatory Visit: Payer: Self-pay

## 2019-06-10 ENCOUNTER — Telehealth: Payer: Self-pay | Admitting: Hematology and Oncology

## 2019-06-10 ENCOUNTER — Encounter: Payer: Self-pay | Admitting: Radiation Oncology

## 2019-06-10 ENCOUNTER — Other Ambulatory Visit: Payer: Self-pay | Admitting: Hematology and Oncology

## 2019-06-10 ENCOUNTER — Other Ambulatory Visit: Payer: Self-pay

## 2019-06-10 ENCOUNTER — Ambulatory Visit
Admission: RE | Admit: 2019-06-10 | Discharge: 2019-06-10 | Disposition: A | Discharge status: Home / Self Care | Payer: BC Managed Care – PPO | Source: Ambulatory Visit | Attending: Radiation Oncology | Admitting: Radiation Oncology

## 2019-06-10 ENCOUNTER — Ambulatory Visit
Admission: RE | Admit: 2019-06-10 | Discharge: 2019-06-10 | Disposition: A | Discharge status: Home / Self Care | Payer: Self-pay | Source: Ambulatory Visit | Attending: Radiation Oncology | Admitting: Radiation Oncology

## 2019-06-10 VITALS — BP 107/45 | HR 90 | Temp 99.2°F | Resp 18 | Wt 113.6 lb

## 2019-06-10 DIAGNOSIS — Z923 Personal history of irradiation: Secondary | ICD-10-CM | POA: Insufficient documentation

## 2019-06-10 DIAGNOSIS — Z170 Estrogen receptor positive status [ER+]: Secondary | ICD-10-CM

## 2019-06-10 DIAGNOSIS — M129 Arthropathy, unspecified: Secondary | ICD-10-CM | POA: Insufficient documentation

## 2019-06-10 DIAGNOSIS — Z7951 Long term (current) use of inhaled steroids: Secondary | ICD-10-CM | POA: Insufficient documentation

## 2019-06-10 DIAGNOSIS — J449 Chronic obstructive pulmonary disease, unspecified: Secondary | ICD-10-CM | POA: Insufficient documentation

## 2019-06-10 DIAGNOSIS — C50212 Malignant neoplasm of upper-inner quadrant of left female breast: Secondary | ICD-10-CM

## 2019-06-10 DIAGNOSIS — C50919 Malignant neoplasm of unspecified site of unspecified female breast: Secondary | ICD-10-CM

## 2019-06-10 DIAGNOSIS — K219 Gastro-esophageal reflux disease without esophagitis: Secondary | ICD-10-CM | POA: Insufficient documentation

## 2019-06-10 DIAGNOSIS — C778 Secondary and unspecified malignant neoplasm of lymph nodes of multiple regions: Secondary | ICD-10-CM | POA: Insufficient documentation

## 2019-06-10 DIAGNOSIS — F17210 Nicotine dependence, cigarettes, uncomplicated: Secondary | ICD-10-CM | POA: Insufficient documentation

## 2019-06-10 DIAGNOSIS — Z171 Estrogen receptor negative status [ER-]: Secondary | ICD-10-CM

## 2019-06-10 DIAGNOSIS — Z803 Family history of malignant neoplasm of breast: Secondary | ICD-10-CM | POA: Insufficient documentation

## 2019-06-10 DIAGNOSIS — C7931 Secondary malignant neoplasm of brain: Secondary | ICD-10-CM | POA: Insufficient documentation

## 2019-06-10 DIAGNOSIS — C50912 Malignant neoplasm of unspecified site of left female breast: Secondary | ICD-10-CM | POA: Insufficient documentation

## 2019-06-10 DIAGNOSIS — C787 Secondary malignant neoplasm of liver and intrahepatic bile duct: Secondary | ICD-10-CM | POA: Insufficient documentation

## 2019-06-10 DIAGNOSIS — Z79899 Other long term (current) drug therapy: Secondary | ICD-10-CM | POA: Insufficient documentation

## 2019-06-10 DIAGNOSIS — C7951 Secondary malignant neoplasm of bone: Secondary | ICD-10-CM | POA: Insufficient documentation

## 2019-06-10 DIAGNOSIS — D649 Anemia, unspecified: Secondary | ICD-10-CM | POA: Insufficient documentation

## 2019-06-10 LAB — SURGICAL PATHOLOGY

## 2019-06-10 MED ORDER — LIDOCAINE-PRILOCAINE 2.5-2.5 % EX CREA: TOPICAL_CREAM | CUTANEOUS | 3 refills | Status: DC

## 2019-06-10 NOTE — Patient Instructions (Signed)
Coronavirus (COVID-19) Are you at risk?  Are you at risk for the Coronavirus (COVID-19)?  To be considered HIGH RISK for Coronavirus (COVID-19), you have to meet the following criteria:  . Traveled to [place], [place], [name], [place] or [place]; or in the [place] to [place], [place], [place], or [place]; and have fever, cough, and shortness of breath within the last 2 weeks of travel OR . Been in close contact with a person diagnosed with COVID-19 within the last 2 weeks and have fever, cough, and shortness of breath . IF YOU DO NOT MEET THESE CRITERIA, YOU ARE CONSIDERED LOW RISK FOR COVID-19.  What to do if you are HIGH RISK for COVID-19?  . If you are having a medical emergency, call 911. . Seek medical care right away. Before you go to a doctor's office, urgent care or emergency department, call ahead and tell them about your recent travel, contact with someone diagnosed with COVID-19, and your symptoms. You should receive instructions from your physician's office regarding next steps of care.  . When you arrive at healthcare provider, tell the healthcare staff immediately you have returned from visiting [place], [place], [place], [place] or [name]; or traveled in the [place] to [place], [place], [place], or [place]; in the last two weeks or you have been in close contact with a person diagnosed with COVID-19 in the last 2 weeks.   . Tell the health care staff about your symptoms: fever, cough and shortness of breath. . After you have been seen by a medical provider, you will be either: o Tested for (COVID-19) and discharged home on quarantine except to seek medical care if symptoms worsen, and asked to  - Stay home and avoid contact with others until you get your results (4-5 days)  - Avoid travel on public transportation if possible (such as bus, train, or airplane) or o Sent to the Emergency Department by EMS for evaluation, COVID-19 testing, and possible  admission depending on your condition and test results.  What to do if you are LOW RISK for COVID-19?  Reduce your risk of any infection by using the same precautions used for avoiding the common cold or flu:  . Wash your hands often with soap and warm water for at least 20 seconds.  If soap and water are not readily available, use an alcohol-based hand sanitizer with at least 60% alcohol.  . If coughing or sneezing, cover your mouth and nose by coughing or sneezing into the elbow areas of your shirt or coat, into a tissue or into your sleeve (not your hands). . Avoid shaking hands with others and consider head nods or verbal greetings only. . Avoid touching your eyes, nose, or mouth with unwashed hands.  . Avoid close contact with people who are sick. . Avoid places or events with large numbers of people in one location, like concerts or sporting events. . Carefully consider travel plans you have or are making. . If you are planning any travel outside or inside the [place], visit the CDC's Travelers' Health webpage for the latest health notices. . If you have some symptoms but not all symptoms, continue to monitor at home and seek medical attention if your symptoms worsen. . If you are having a medical emergency, call 911.   ADDITIONAL HEALTHCARE OPTIONS FOR PATIENTS  [facility] / e-Visit: [email or web address]         [facility]: [phone]  [facility]: [phone]                   [facility]: [phone]   

## 2019-06-10 NOTE — Progress Notes (Signed)
Radiation Oncology         [phone] ________________________________  Initial Outpatient Consultation  Name: Melissa [name] MRN: [id number]  Date: 06/10/2019  DOB: [birth year]  UQ:[id number], Provider, MD  Nicholas Lose, MD   REFERRING PHYSICIAN: Nicholas Lose, MD  DIAGNOSIS: The encounter diagnosis was Malignant neoplasm of upper-inner quadrant of left breast in female, estrogen receptor positive ([place]).  Metastatic breast carcinoma  HISTORY OF PRESENT ILLNESS::Melissa [name] is a 53 y.o. female who is accompanied by husband. She has a history of invasive ductal carcinoma in 2013. She had a left breast lumpectomy on 12/11/2012, adjuvant radiation therapy from 03/25/2013 - 05/06/2013, and neo-adjuvant chemotherapy TCH x6 followed by Herceptin maintenance from 07/12/2012 - 07/17/2013.  Patient had a recurrence on 07/27/2017 following a brain MRI that showed a 3.4 x 2.9 x 2.9 cm right frontal lobe mass with imaging characteristics of solitary metastasis. She had a right frontal brain resection on 08/04/2017 which showed a poorly differentiated tumor IHC suggesting breast primary ER and PR positive. Patient underwent stereotactic radiation therapy from 08/25/2017 - 09/04/2017. She was on Tamoxifen '20mg'$  daily from 06/05/2013 - 07/20/2017.  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 She underwent radiation therapy from 12/18/2017 - 12/19/2017.  MRI of brain on 03/08/2019 showed further enlargement of a nodular focus of enhancement at the superior margin of the right frontal resection, now measuring 5 x 6 mm compared with 3 x 4.5 mm previously. The other two small foci of nodular enhancement were stable since the last examination on 08/23/2018. The medial nodular focus actually appeared smaller than seen on the previous study.  CT of chest on 05/09/2019 showed interval increase in size of enhancing nodular left internal mammary soft tissue, measuring approximately 2.8 x 1.4  cm in aggregate, previously 2.1 x 1.2 cm when measuring similarly. There was also an interval increase in bony erosion of the posterior and lateral left third rib adjacent to pleural soft tissue and an interval increase in soft tissue lesion eroding the left aspect of the sternal body at the level of the left third costochondral junction, soft tissue nodule measuring at least 3.1 x 2.2 cm, previously 2.5 x 2.0 cm when measuring similarly. Finally, it showed re-demonstrated enlarged supraclavicular, lower cervical, and left posterior cervical lymph nodes, which did not appear changed in size. Findings were consistent with malignant recurrence.   Patient has been under the care of Dr. Lindi Adie, whom she last saw on 05/14/2019. She was on Lapatinib with Letrozole from 09/17/2017 - 05/14/2019, which was discontinued for progression.   PET scan on 05/23/2019 showed widespread metastatic disease, including to liver, bones, nodal stations of the chest, abdomen, and low neck. It also showed hypermetabolic pulmonary lesions, favored to be infections/inflammatory. The more nodular areas, including within the left lower lobe medially, are technically indeterminate.   Biopsy of liver done on 06/05/2019 showed metastatic breast carcinoma.  Of note, she has a history of Aspergillosis and underwent left upper lobectomy at age 3. She is under the care of Dr. Baxter Flattery, infectious disease, and Dr. Melvyn Novas, pulmonology. 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  PREVIOUS RADIATION THERAPY: Yes   03/25/2019: SRS brain//PTV 5: Right frontal 5 mm target was treated to a prescription dose of 20 Gy in a single fraction.   09/04/2018:SRS Brain// Right Frontal, 2 targets / 20 Gy in 1 fraction PTV3:Ant Rt Frontal 36m 20Gy PTV4:Rt Frontal resection cavity513m20Gy  12/18/2017:SRS brain//PTV2: 4 mm Rt  Parietal lesiontreated to20 Gyin 1 Fx  08/25/2017, 08/28/2017, 08/30/2017, 09/01/2017, 09/04/2017: PTV1:  post op SRS to right frontal lobe resection cavity in 5 fxs  Previous left breast radiation therapy in the direction of Dr. Arloa Koh.    PAST MEDICAL HISTORY:  Past Medical History:  Diagnosis Date   Anemia    Arthritis    knees and hips   Asthma    Breast cancer ([place])    Bronchiectasis ([place])    Bronchiolitis    Cancer ([place])    breast cancer 4193   Complication of anesthesia    bp dropped + desat    COPD (chronic obstructive pulmonary disease) ([facility])    Dyspnea    DOE   GERD (gastroesophageal reflux disease)    H/O coccidioidomycosis    was reason for lung lobectomy   Headache(784.0)    due to eye strain or not eating   History of anemia    no current problem   History of asthma    as a child   History of breast cancer 2014   left   History of chemotherapy    finished 07/17/2013   History of hiatal hernia    AGE 7   Hx of radiation therapy 03/25/13-05/06/13   left breast 5000 cGy/25 sessions, left breast boost 1000 cGy/5 sessions   Pneumonia    LAST FLARE UP 01/2018   Runny nose 07/30/2013   clear drainage   Wears dentures    upper    PAST SURGICAL HISTORY: Past Surgical History:  Procedure Laterality Date   APPLICATION OF CRANIAL NAVIGATION N/A 08/04/2017   Procedure: APPLICATION OF CRANIAL NAVIGATION;  Surgeon: [name], [name], MD;  Location: [facility];  Service: Neurosurgery;  Laterality: N/A;   AXILLARY LYMPH NODE DISSECTION Left 02/08/2013   Procedure: LEFT AXILLARY DISSECTION;  Surgeon: Edward Jolly, MD;  Location: [facility];  Service: General;  Laterality: Left;   BREAST CYST EXCISION Right 2006   BREAST LUMPECTOMY Left 2014   BREAST LUMPECTOMY WITH NEEDLE LOCALIZATION AND AXILLARY SENTINEL LYMPH NODE BX Left 12/31/2012   Procedure: NEEDLE LOCALIZATION LEFT BREAST LUMPECTOMY AND LEFT AXILLARY SENTENIAL LYMPH NODE BX;  Surgeon: Edward Jolly, MD;  Location: [facility];  Service:  General;  Laterality: Left;   CESAREAN SECTION  1995/1996   CRANIOTOMY Right 08/04/2017   Procedure: Right Frontal craniotomy for resection of tumor with stereotactic navigation;  Surgeon: [name], [name], MD;  Location: [facility];  Service: Neurosurgery;  Laterality: Right;  Right Frontal craniotomy for resection of tumor with stereotactic navigation   IR IMAGING GUIDED PORT INSERTION  05/31/2019   LUNG LOBECTOMY Left 05/1996   upper lobe - due to [facility] Fever   PORT-A-CATH REMOVAL Right 08/02/2013   Procedure: REMOVAL PORT-A-CATH;  Surgeon: Edward Jolly, MD;  Location: [facility];  Service: General;  Laterality: Right;   PORTACATH PLACEMENT  07/02/2012   Procedure: INSERTION PORT-A-CATH;  Surgeon: Edward Jolly, MD;  Location: [facility];  Service: General;  Laterality: N/A;  right   VIDEO BRONCHOSCOPY WITH ENDOBRONCHIAL ULTRASOUND N/A 05/23/2018   Procedure: VIDEO BRONCHOSCOPY WITH ENDOBRONCHIAL ULTRASOUND;  Surgeon: Garner Nash, DO;  Location: [facility];  Service: Thoracic;  Laterality: N/A;    FAMILY HISTORY:  Family History  Problem Relation Age of Onset   Emphysema Mother        was a smoker   Heart disease Mother    Melanoma Mother  dx in her 29s   Breast cancer Mother 4   Asthma Brother    Breast cancer Cousin        mother's maternal cousin; dx in her 20s    SOCIAL HISTORY:  Social History   Tobacco Use   Smoking status: Current Every Day Smoker    Packs/day: 0.75    Years: 24.00    Pack years: 18.00    Types: Cigarettes   Smokeless tobacco: Never Used  Substance Use Topics   Alcohol use: No   Drug use: No    ALLERGIES:  Allergies  Allergen Reactions   Aspirin Anaphylaxis and Shortness Of Breath    THROAT CLOSES   Protonix [Pantoprazole] Nausea Only and Other (See Comments)    Also caused a "film in the mouth" and caused chest pressure   Iodinated Diagnostic Agents Rash     MEDICATIONS:  Current Outpatient Medications  Medication Sig Dispense Refill   albuterol (VENTOLIN HFA) 108 (90 BASE) MCG/ACT inhaler Inhale 2 puffs into the lungs every 6 (six) hours as needed. 1 Inhaler 3   budesonide-formoterol (SYMBICORT) 160-4.5 MCG/ACT inhaler Inhale 2 puffs into the lungs 2 (two) times daily.     Isavuconazonium Sulfate (CRESEMBA) 186 MG CAPS Take 2 capsules (372 mg total) by mouth 3 (three) times daily. X 2 days. Followed by 2 capsules per day thereafter 60 capsule 6   lapatinib (TYKERB) 250 MG tablet Take 4 tablets (1,000 mg total) by mouth daily. Take on an empty stomach, at least 1 hour before or 1 hour after meals. 120 tablet 3   letrozole (FEMARA) 2.5 MG tablet Take 1 tablet (2.5 mg total) by mouth daily. 30 tablet 11   levalbuterol (XOPENEX) 0.63 MG/3ML nebulizer solution Take 3 mLs (0.63 mg total) by nebulization every 4 (four) hours as needed for wheezing or shortness of breath. 540 mL 6   Melatonin 5 MG TABS Take 5 mg by mouth at bedtime as needed.     naproxen sodium (ALEVE) 220 MG tablet Take 440 mg by mouth 2 (two) times daily as needed (headache).      oxyCODONE-acetaminophen (PERCOCET/ROXICET) 5-325 MG tablet Take 1 tablet by mouth every 6 (six) hours as needed for severe pain. (Patient taking differently: Take 1 tablet by mouth every 6 (six) hours as needed for severe pain. ) 60 tablet 0   Chlorpheniramine-Codeine 2-10 MG/5ML LIQD Take 5 mLs by mouth every 6 (six) hours as needed. (Patient not taking: Reported on 05/01/2019) 200 mL 1   HYDROcodone-guaiFENesin 2.5-200 MG/5ML SOLN Take 5 mLs by mouth every 8 (eight) hours. (Patient not taking: Reported on 05/01/2019) 560 mL 0   lidocaine-prilocaine (EMLA) cream Apply to affected area once 30 g 3   predniSONE (DELTASONE) 50 MG tablet Take for contrast allergy prior to study. Take one tablet 13 hours before study, one tablet 7 hours before study,and one tablet 1 hour before study (Patient not  taking: Reported on 05/01/2019) 30 tablet 0   VIRTUSSIN A/C 100-10 MG/5ML syrup TK 5 MLS PO EVERY 8 HOURS     No current facility-administered medications for this encounter.     REVIEW OF SYSTEMS:  A 10+ POINT REVIEW OF SYSTEMS WAS OBTAINED including neurology, dermatology, psychiatry, cardiac, respiratory, lymph, extremities, GI, GU, musculoskeletal, constitutional, reproductive, HEENT.  Describes pain along the left lateral chest wall area radiating into the left upper back.  Some discomfort along the sternum area   PHYSICAL EXAM:  weight is 113 lb 9.6 oz (51.5  kg). Her temperature is 99.2 F (37.3 C). Her blood pressure is 107/45 (abnormal) and her pulse is 90. Her respiration is 18 and oxygen saturation is 99%.   General: Alert and oriented, in no acute distress HEENT: Head is normocephalic. Extraocular movements are intact. Oropharynx is clear. Neck: Neck is supple, no palpable cervical or supraclavicular lymphadenopathy. Heart: Regular in rate and rhythm with no murmurs, rubs, or gallops. Chest: Clear to auscultation bilaterally, with no rhonchi, wheezes, or rales. Abdomen: Soft, nontender, nondistended, with no rigidity or guarding. Extremities: No cyanosis or edema. Lymphatics: see Neck Exam Skin: No concerning lesions. Musculoskeletal: symmetric strength and muscle tone throughout. Neurologic: Cranial nerves II through XII are grossly intact. No obvious focalities. Speech is fluent. Coordination is intact. Psychiatric: Judgment and insight are intact. Affect is appropriate. Right breast: No palpable mass nipple discharge or bleeding.  Left breast: Mild radiation changes noted.  No dominant mass appreciated breast nipple discharge or bleeding.  Tattoos in place along the chest from previous left breast radiation treatment  ECOG = 1  0 - Asymptomatic (Fully active, able to carry on all predisease activities without restriction)  1 - Symptomatic but completely ambulatory  (Restricted in physically strenuous activity but ambulatory and able to carry out work of a light or sedentary nature. For example, light housework, office work)  2 - Symptomatic, <50% in bed during the day (Ambulatory and capable of all self care but unable to carry out any work activities. Up and about more than 50% of waking hours)  3 - Symptomatic, >50% in bed, but not bedbound (Capable of only limited self-care, confined to bed or chair 50% or more of waking hours)  4 - Bedbound (Completely disabled. Cannot carry on any self-care. Totally confined to bed or chair)  5 - Death   Eustace Pen MM, Creech RH, Tormey DC, et al. [phone]). "Toxicity and response criteria of the [facility]". [place] Oncol. 5 (6): 649-55  LABORATORY DATA:  Lab Results  Component Value Date   WBC 10.4 06/05/2019   HGB 10.0 (L) 06/05/2019   HCT 31.5 (L) 06/05/2019   MCV 87.5 06/05/2019   PLT 329 06/05/2019   NEUTROABS 7.3 06/05/2019   Lab Results  Component Value Date   NA 138 06/05/2019   K 4.1 06/05/2019   CL 104 06/05/2019   CO2 24 06/05/2019   GLUCOSE 89 06/05/2019   CREATININE 0.98 06/05/2019   CALCIUM 9.3 06/05/2019      RADIOGRAPHY: Nm Pet Image Initial (pi) Skull Base To Thigh  Result Date: 05/23/2019 CLINICAL DATA:  Subsequent treatment strategy for stage IV breast cancer. Evaluate thoracic lesions for malignancy versus Aspergillus. Fungal infection of left lung. EXAM: NUCLEAR MEDICINE PET SKULL BASE TO THIGH TECHNIQUE: 5.5 mCi F-18 FDG was injected intravenously. Full-ring PET imaging was performed from the skull base to thigh after the radiotracer. CT data was obtained and used for attenuation correction and anatomic localization. Fasting blood glucose: 97 mg/dl COMPARISON:  Chest CT 05/09/2019.  Abdominopelvic CT 01/18/2018 FINDINGS: Mediastinal blood pool activity: SUV max 2.3 Liver activity: SUV max NA NECK: Bilateral low cervical and supraclavicular nodal metastasis.  Index right low jugular node measures 8 mm and a S.U.V. max of 15.0 on 44/4. Left supraclavicular node measures 9 mm and a S.U.V. max of 8.5 on 41/4. Incidental CT findings: none CHEST: 1.4 cm a prevascular node measures a S.U.V. max of 14.1 on 61/4. More mild left sided mediastinal hypermetabolism, including at a  S.U.V. max of 4.1 on 63/4. Left upper lung peripherally hypermetabolic cavitary lesion is likely infectious/inflammatory. This measures on the order of 5.8 cm and a S.U.V. max of 7.6 on 58/4. There are also hypermetabolic pulmonary nodular opacities which are also favored to be infectious or inflammatory but technically indeterminate. Example in the left lower lobe medially at a S.U.V. max of 5.1 on 68/4. Incidental CT findings: Deferred to recent diagnostic CT. ABDOMEN/PELVIS: Portocaval hypermetabolism is likely nodal, but difficult to delineate secondary to paucity of intra-abdominal fat in this region. Example 9 mm and a S.U.V. max of 16.1 on 113/4. Inferior right hepatic lobe mass measures 6.5 x 6.4 cm and a S.U.V. max of 13.9 on 122/4. Incidental CT findings: Normal adrenal glands. Abdominal aortic atherosclerosis. SKELETON: Hypermetabolism corresponding to the destructive lesion involving the posterior and posterolateral left third rib. This measures a S.U.V. max of 16.4, including on 50/4. Lytic sternal body and surrounding soft tissue lesion measures a S.U.V. max of 17.0, eccentric left, on 70/4. Incidental CT findings: none IMPRESSION: 1. Widespread metastatic disease, including to liver, bones, nodal stations of the chest, abdomen, and low neck. 2. Hypermetabolic pulmonary lesions, favored to be infectious/inflammatory. The more nodular areas, including within the the left lower lobe medially, are technically indeterminate. 3.  Aortic Atherosclerosis (ICD10-I70.0). Electronically Signed   By: Abigail Miyamoto M.D.   On: 05/23/2019 11:11   US Biopsy (liver)  Result Date: 06/05/2019 INDICATION:  History of breast cancer, now with hypermetabolic liver mass worrisome for metastatic disease. Please perform ultrasound-guided liver lesion biopsy for tissue diagnostic purposes. EXAM: ULTRASOUND GUIDED LIVER LESION BIOPSY COMPARISON:  PET-CT-05/23/2019 MEDICATIONS: None ANESTHESIA/SEDATION: Fentanyl 50 mcg IV; Versed 3 mg IV Total Moderate Sedation time:  10 Minutes. The patient's level of consciousness and vital signs were monitored continuously by radiology nursing throughout the procedure under my direct supervision. COMPLICATIONS: None immediate. PROCEDURE: Informed written consent was obtained from the patient after a discussion of the risks, benefits and alternatives to treatment. The patient understands and consents the procedure. A timeout was performed prior to the initiation of the procedure. Ultrasound scanning was performed of the right upper abdominal quadrant demonstrates a large, at least 7.4 x 6.0 cm hypoechoic mass caudal aspect the right lobe of the liver (image 2) correlating with the hypermetabolic liver mass seen on preceding PET-CT. The procedure was planned. The right upper abdominal quadrant was prepped and draped in the usual sterile fashion. The overlying soft tissues were anesthetized with 1% lidocaine with epinephrine. A 17 gauge, 6.8 cm co-axial needle was advanced into a peripheral aspect of the lesion. This was followed by 5 core biopsies with an 18 gauge core device under direct ultrasound guidance. The coaxial needle tract was embolized with a small amount of Gel-Foam slurry and superficial hemostasis was obtained with manual compression. Post procedural scanning was negative for definitive area of hemorrhage or additional complication. A dressing was placed. The patient tolerated the procedure well without immediate post procedural complication. IMPRESSION: Technically successful ultrasound guided core needle biopsy of hypermetabolic mass within the caudal aspect of the right lobe  of the liver. Electronically Signed   By: Sandi Mariscal M.D.   On: 06/05/2019 14:50   Ir Imaging Guided Port Insertion  Result Date: 05/31/2019 INDICATION: 53 year old with metastatic breast cancer. Port-A-Cath needed for treatment. EXAM: FLUOROSCOPIC AND ULTRASOUND GUIDED PLACEMENT OF A SUBCUTANEOUS PORT COMPARISON:  None. MEDICATIONS: Ancef 2 g; The antibiotic was administered within an appropriate time interval prior to skin  puncture. ANESTHESIA/SEDATION: Versed 3.0 mg IV; Fentanyl 75 mcg IV; Moderate Sedation Time:  38 minutes The patient was continuously monitored during the procedure by the interventional radiology nurse under my direct supervision. FLUOROSCOPY TIME:  18 seconds, 1 mGy COMPLICATIONS: None immediate. PROCEDURE: The procedure, risks, benefits, and alternatives were explained to the patient. Questions regarding the procedure were encouraged and answered. The patient understands and consents to the procedure. Patient was placed supine on the interventional table. Ultrasound confirmed a patent right internal jugular vein. Ultrasound image was saved for documentation. The right chest and neck were cleaned with a skin antiseptic and a sterile drape was placed. Maximal barrier sterile technique was utilized including caps, mask, sterile gowns, sterile gloves, sterile drape, hand hygiene and skin antiseptic. The right neck was anesthetized with 1% lidocaine. Small incision was made in the right neck with a blade. Micropuncture set was placed in the right internal jugular vein with ultrasound guidance. The micropuncture wire was used for measurement purposes. The right chest was anesthetized with 1% lidocaine with epinephrine. #15 blade was used to make an incision and a subcutaneous port pocket was formed. [place] was assembled. Subcutaneous tunnel was formed with a stiff tunneling device. The port catheter was brought through the subcutaneous tunnel. The port was placed in the  subcutaneous pocket and sutured in place. The micropuncture set was exchanged for a peel-away sheath. The catheter was placed through the peel-away sheath and the tip was positioned at the SVC and right atrium junction. Catheter placement was confirmed with fluoroscopy. The port was accessed and flushed with heparinized saline. The port pocket was closed using two layers of absorbable sutures and Dermabond. The vein skin site was closed using a single layer of absorbable suture and Dermabond. Sterile dressings were applied. Patient tolerated the procedure well without an immediate complication. Ultrasound and fluoroscopic images were taken and saved for this procedure. IMPRESSION: Placement of a subcutaneous port device. Catheter tip at the SVC and right atrium junction. Electronically Signed   By: Markus Daft M.D.   On: 05/31/2019 19:02      IMPRESSION: Metastatic breast carcinoma  Has developed significant progression within the chest region.  She does have some pain in this area and we discussed potential palliative radiation therapy.  Her lesions along the sternum and internal mammary nodal areas are in a previous overlap region with her previous left breast radiation treatment.  We discussed  implications of including sternal fracture and rib fractures.  Patient could receive radiation treatments to the left posterior rib region where she is likely symptomatic without any significant overlap with her previous radiation therapy.  Patient's recent liver biopsy shows HER-2/neu positivity, ER/PR negative.  I discussed these results with Dr. Lindi Adie and the patient will be a candidate for Kadcyla.  With the potential benefit from this medication and the potential for overlap with her previous radiation therapy,  palliative radiation therapy will be held at this time.    PLAN: Follow-up with Dr. Baxter Flattery on 06/17/2019. MRI of brain scheduled for 06/20/2019. CT of chest scheduled for 07/15/2019. Follow-up with  Dr. Lindi Adie on 01/05/20201.  she will continue close follow-up with the [facility] team concerning her brain metastasis.    ------------------------------------------------  Blair Promise, PhD, MD  This document serves as a record of services personally performed by Gery Pray, MD. It was created on his behalf by Clerance Lav, a trained medical scribe. The creation of this record is based on the scribe's  personal observations and the provider's statements to them. This document has been checked and approved by the attending provider.

## 2019-06-10 NOTE — Progress Notes (Addendum)
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 Widespread metastatic disease, including to liver, bones, nodal stations of the chest, abdomen, and low neck. 2. Hypermetabolic pulmonary lesions, favored to be infectious/inflammatory. The more nodular areas, including within the the left lower lobe medially, are technically indeterminate.  Past/Anticipated chemotherapy by medical oncology, if any: Per Dr. Lindi Adie 05/24/19:  I discussed with the patient the result of the PET CT scan which showed a very large right lower lobe liver lesion in addition to multiple lymph nodes and bone lesions. She does not appear to have any pain in the sternal lesion with s/p soft tissue tumor. I spoke with Dr. Pascal Lux with interventional radiology.  The plan is to obtain a liver biopsy and also place a port. I will see her after these biopsies and procedures to talk about starting her on treatment.  My plan is to start her on Kadcyla.        Electronically signed by Nicholas Lose, MD at 05/24/2019 8:00 AM   Patient's main complaints related to symptomatic tumor(s) are: 05/27/19:  Received call from pt stating she is experiencing sternal pain radiating to her back unrelieved with OTC pain medication.  Per Dr. Lindi Adie pt to start taking percocet 5-325 and to place a referral for radiation.  Referral placed and pt educated on pain medication.  RN also educated pt on taking OTC stool softner to help with any constipation from pain medication.  Pt  verbalized understanding.         Electronically signed by Myrtie Hawk, RN at 05/27/2019 11:06 AM   Pain on a scale of 0-10 is: Pt reports pain on LEFT side, under axilla area. Rates pain 2-3/10. Pt reports that occasionally pain will originate in sternum and radiate to above area.    Ambulatory status? Walker? Wheelchair?: Pt ambulating without assistive device, steady gait.   SAFETY ISSUES: Prior radiation? TREATMENT DATES: 03/25/2013 through 05/06/2013                                                       SITE/DOSE:  Left breast 5000 cGy in 25 sessions, left breast boost 1000 cGy 5 sessions                         BEAMS/ENERGY:  6 MV photons, tangential fields to the left breast with deep inspiration and breath-hold to avoid the heart.   9 MEV electrons, left breast boost       Radiation treatment dates:   08/25/2017, 08/28/2017, 08/30/2017, 09/01/2017, 09/04/2017  Site/dose:   PTV1: Rightfrontal 3.8 cm post-optarget was treated using 4Rapid Arc VMAT Beamsto a fraction dose of 5 Gy for the first of 5 fractions to achieveatotalprescription dose of 25Gy.   Beams/energy:   SBRT/SRT-VMAT // 6X-FFF Photon   Radiation treatment dates:   12/18/2017  Site/dose:   PTV2 Rt Parietal treated to 20 Gy in 1 Fx  Beams//energy:   SBRT/SRT-VMAT // 6X-FFF  Radiation treatment dates:   09/04/2018  Site/dose:   Brain, Right Frontal, 2 targets / 20 Gy in 1 fraction PTV3: Ant Rt Frontal 62m 20Gy PTV4: Rt Frontal resection cavity 545m 20Gy   Beams/energy:   ExacTrac, 4 vmat beams/ 6 FFF     Indication for treatment:  Palliation       Radiation treatment dates:   03/25/2019   Site/dose/beams/energy:   Right frontal 5 mm target was treated using 3 Dynamic Conformal Arcs to a prescription dose of 20 Gy.  ExacTrac registration was performed for each couch angle.  The 100% isodose line was prescribed.  6 MV X-rays were delivered in the flattening filter free beam  mode           Pacemaker/ICD? No  Possible current pregnancy? No  Is the patient on methotrexate? No  Additional Complaints / other details:  Pt presents today for initial consult with Dr. KiSondra Comeor Radiation Oncology. Pt is accompanied by husband, ChGerald Stabs  BP (!) 107/45 (BP Location: Right Arm, Patient Position: Sitting, Cuff Size: Normal)   Pulse 90   Temp 99.2 F (37.3 C)   Resp 18   Wt 113 lb 9.6 oz (51.5 kg)   LMP 07/25/2012 Comment: pregnancy waiver form signed 01-18-2018  SpO2 99%   BMI 20.78 kg/m   Wt Readings from Last 3 Encounters:  06/10/19 113 lb 9.6 oz (51.5 kg)  05/16/19 112 lb (50.8 kg)  05/14/19 109 lb 11.2 oz (49.8 kg)   JiLoma SousaRN BSN

## 2019-06-10 NOTE — Progress Notes (Signed)
START ON PATHWAY REGIMEN - Breast     A cycle is every 21 days:     Ado-trastuzumab emtansine   **Always confirm dose/schedule in your pharmacy ordering system**  Patient Characteristics: Distant Metastases or Locoregional Recurrent Disease - Unresected or Locally Advanced Unresectable Disease Progressing after Neoadjuvant and Local Therapies, HER2 Positive, ER Negative/Unknown, Chemotherapy, Second Line Therapeutic Status: Distant Metastases BRCA Mutation Status: Did Not Order Test ER Status: Negative (-) HER2 Status: Positive (+) PR Status: Negative (-) Line of Therapy: Second Line Intent of Therapy: Non-Curative / Palliative Intent, Discussed with Patient

## 2019-06-10 NOTE — Telephone Encounter (Signed)
I informed the patient that based on our discussion with Dr. Sondra Come, palliative radiation is being held at this time because the pain is reasonably well controlled with the current pain regimen.  Treatment plan: Start Kadcyla next week every 3 weeks I counseled extensively about cancer about the risks and benefits of treatment and she is agreeable to proceed. She has a port in place. We will get an echocardiogram prior to starting treatment.

## 2019-06-11 ENCOUNTER — Telehealth: Payer: Self-pay | Admitting: Hematology and Oncology

## 2019-06-11 NOTE — Telephone Encounter (Signed)
Confirmed December/January appointments with patient. VG will address existing January appointments at 12/9 visit.

## 2019-06-12 ENCOUNTER — Encounter: Payer: Self-pay | Admitting: Pharmacy Technician

## 2019-06-13 ENCOUNTER — Telehealth: Payer: Self-pay | Admitting: Pharmacy Technician

## 2019-06-13 NOTE — Telephone Encounter (Signed)
[facility] Patient Advocate Encounter  Patient called today to inform us that her husband's employer discontinued insurance coverage. They have applied and will receive marketplace insurance which will start in January. I have filled out the application for [employer] and is currently pending determination. The patient has been made aware of the application process. I will call and notify her once we hear approved or denied. She currently has 2 capsules remaining.

## 2019-06-14 ENCOUNTER — Other Ambulatory Visit: Payer: Self-pay | Admitting: *Deleted

## 2019-06-17 ENCOUNTER — Ambulatory Visit (HOSPITAL_COMMUNITY)
Admission: RE | Admit: 2019-06-17 | Discharge: 2019-06-17 | Disposition: A | Discharge status: Home / Self Care | Payer: Self-pay | Source: Ambulatory Visit | Attending: Hematology and Oncology | Admitting: Hematology and Oncology

## 2019-06-17 ENCOUNTER — Other Ambulatory Visit: Payer: Self-pay

## 2019-06-17 ENCOUNTER — Ambulatory Visit (INDEPENDENT_AMBULATORY_CARE_PROVIDER_SITE_OTHER): Payer: BC Managed Care – PPO | Admitting: Internal Medicine

## 2019-06-17 ENCOUNTER — Encounter: Payer: Self-pay | Admitting: Internal Medicine

## 2019-06-17 DIAGNOSIS — J984 Other disorders of lung: Secondary | ICD-10-CM

## 2019-06-17 DIAGNOSIS — Z171 Estrogen receptor negative status [ER-]: Secondary | ICD-10-CM

## 2019-06-17 DIAGNOSIS — R000 Tachycardia, unspecified: Secondary | ICD-10-CM | POA: Insufficient documentation

## 2019-06-17 DIAGNOSIS — Z01818 Encounter for other preprocedural examination: Secondary | ICD-10-CM | POA: Insufficient documentation

## 2019-06-17 DIAGNOSIS — C50212 Malignant neoplasm of upper-inner quadrant of left female breast: Secondary | ICD-10-CM

## 2019-06-17 DIAGNOSIS — C7931 Secondary malignant neoplasm of brain: Secondary | ICD-10-CM

## 2019-06-17 DIAGNOSIS — B4489 Other forms of aspergillosis: Secondary | ICD-10-CM

## 2019-06-17 DIAGNOSIS — F17200 Nicotine dependence, unspecified, uncomplicated: Secondary | ICD-10-CM | POA: Insufficient documentation

## 2019-06-17 DIAGNOSIS — R0609 Other forms of dyspnea: Secondary | ICD-10-CM | POA: Insufficient documentation

## 2019-06-17 DIAGNOSIS — C50912 Malignant neoplasm of unspecified site of left female breast: Secondary | ICD-10-CM

## 2019-06-17 NOTE — Progress Notes (Signed)
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 Identified by name and dob  Patient ID: Melissa [name], female   DOB: 06/17/1966, 53 y.o.   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 She reports that she Lost healthcare insurance (her husband was on short-time disability now on long-term disability but now no insurance), troubleshooting getting access to insurance  Denies "problem breathing" does have chest pressure from mets to sternum and ribs- constant pain not pleuritic. Radiates to left side where she also has rib metastic lesions.  Has some productive sputum with blood tinged. That she says is not frequent. She had PET scan that suggests most abnormality due to metastatic cancer however, she was concerned about   IMPRESSION: 1. Interval increase in size of enhancing nodular left internal mammary soft tissue, measuring approximately 2.8 x 1.4 cm in aggregate, previously 2.1 x 1.2 cm when measured similarly (series 2, image 38). Redemonstrated enlarged supraclavicular, lower cervical, and left posterior cervical lymph nodes, which do not appear to be changed in size (series 2, image 9, 5).  2. There is interval increase in bony erosion of the posterior and lateral left third rib adjacent to pleural soft tissue (series 2, image 27). Interval increase in soft tissue lesion eroding the left aspect of the sternal body at the level of the left third costochondral junction, soft tissue nodule measuring at least 3.1 x 2.2 cm, previously 2.5 x 2.0 cm when measured similarly (series 2, image 58).   Outpatient Encounter Medications as of 06/17/2019  Medication Sig  . albuterol (VENTOLIN HFA) 108 (90 BASE) MCG/ACT inhaler Inhale 2 puffs into the lungs every 6 (six) hours as needed.  .  budesonide-formoterol (SYMBICORT) 160-4.5 MCG/ACT inhaler Inhale 2 puffs into the lungs 2 (two) times daily.  . Isavuconazonium Sulfate (CRESEMBA) 186 MG CAPS Take 2 capsules (372 mg total) by mouth 3 (three) times daily. X 2 days. Followed by 2 capsules per day thereafter  . levalbuterol (XOPENEX) 0.63 MG/3ML nebulizer solution Take 3 mLs (0.63 mg total) by nebulization every 4 (four) hours as needed for wheezing or shortness of breath.  . lidocaine-prilocaine (EMLA) cream Apply to affected area once  . Melatonin 5 MG TABS Take 5 mg by mouth at bedtime as needed.  Marland Kitchen oxyCODONE-acetaminophen (PERCOCET/ROXICET) 5-325 MG tablet Take 1 tablet by mouth every 6 (six) hours as needed for severe pain. (Patient taking differently: Take 1 tablet by mouth every 6 (six) hours as needed for severe pain. )  . Chlorpheniramine-Codeine 2-10 MG/5ML LIQD Take 5 mLs by mouth every 6 (six) hours as needed. (Patient not taking: Reported on 05/01/2019)  . HYDROcodone-guaiFENesin 2.5-200 MG/5ML SOLN Take 5 mLs by mouth every 8 (eight) hours. (Patient not taking: Reported on 05/01/2019)  . lapatinib (TYKERB) 250 MG tablet Take 4 tablets (1,000 mg total) by mouth daily. Take on an empty stomach, at least 1 hour before or 1 hour after meals. (Patient not taking: Reported on 06/17/2019)  . letrozole (FEMARA) 2.5 MG tablet Take 1 tablet (2.5 mg total) by mouth daily. (Patient not taking: Reported on 06/17/2019)  . naproxen sodium (ALEVE) 220 MG tablet Take 440 mg by mouth 2 (two) times daily as needed (headache).   . predniSONE (DELTASONE) 50 MG tablet Take for contrast allergy prior to study. Take one tablet 13 hours before study, one tablet 7  hours before study,and one tablet 1 hour before study (Patient not taking: Reported on 05/01/2019)  . VIRTUSSIN A/C 100-10 MG/5ML syrup TK 5 MLS PO EVERY 8 HOURS   No facility-administered encounter medications on file as of 06/17/2019.      Patient Active Problem List   Diagnosis Date  Noted  . 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 Mediastinal adenopathy   . H/O coccidioidomycosis 05/16/2018  . DOE (dyspnea on exertion) 05/03/2018  . Diarrhea 04/22/2018  . Cavitary lesion of lung in area of previous cyts in apex of Sup Segment of LLL 03/29/2018  . Aortic atherosclerosis ([place]) 01/22/2018  . Emphysema lung ([place]) 01/22/2018  . CAP (community acquired pneumonia) 11/23/2017  . Metastasis to brain ([place]) 08/04/2017  . Brain metastasis ([place]) 07/27/2017  . Nicotine abuse 07/27/2017  . Migraines 07/27/2017  . Malignant tumor of breast ([place]) 06/12/2017  . HCAP (healthcare-associated pneumonia) 11/26/2016  . Upper airway cough syndrome 08/10/2016  . Abnormal echocardiogram 08/07/2013  . Chest tightness or pressure 08/07/2013  . Hx of radiation therapy   . Edema of left lower extremity 11/19/2012  . Tachycardia 09/20/2012  . Breast cancer of upper-inner quadrant of left female breast ([place]) 06/08/2012  . Obstructive bronchiectasis ([place]) 01/26/2011  . Cigarette smoker 01/26/2011     Health Maintenance Due  Topic Date Due  . TETANUS/TDAP  01/18/1987  . PAP SMEAR-Modifier  11/20/2016  . COLONOSCOPY  01/17/2018    Social History   Tobacco Use  . Smoking status: Current Every Day Smoker    Packs/day: 0.75    Years: 24.00    Pack years: 18.00    Types: Cigarettes  . Smokeless tobacco: Never Used  Substance Use Topics  . Alcohol use: No  . Drug use: No   Review of Systems 12 point ros is negative except for shortness of breath at her baseline, occasional cough, chest pain as described in hpi Physical Exam   LMP 07/25/2012 Comment: pregnancy waiver form signed 01-18-2018   Did not examine CBC Lab Results  Component Value Date   WBC 10.4 06/05/2019   RBC 3.60 (L) 06/05/2019   HGB 10.0 (L) 06/05/2019   HCT 31.5 (L) 06/05/2019   PLT 329 06/05/2019   MCV 87.5 06/05/2019   MCH 27.8 06/05/2019   MCHC 31.7 06/05/2019   RDW 21.0 (H)  06/05/2019   LYMPHSABS 1.9 06/05/2019   MONOABS 0.6 06/05/2019   EOSABS 0.5 06/05/2019    BMET Lab Results  Component Value Date   NA 138 06/05/2019   K 4.1 06/05/2019   CL 104 06/05/2019   CO2 24 06/05/2019   GLUCOSE 89 06/05/2019   BUN 12 06/05/2019   CREATININE 0.98 06/05/2019   CALCIUM 9.3 06/05/2019   GFRNONAA >60 06/05/2019   GFRAA >60 06/05/2019      Assessment and Plan  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 She is to undergo chemo for further metastatic disease. Concern for access to meds. We have been able to get patient assistance  Metastatic breast ca = defer to her oncology and rad onc team for management.  Plan to see back in 6 wk

## 2019-06-17 NOTE — Progress Notes (Signed)
  Echocardiogram 2D Echocardiogram has been performed.  Burnett Kanaris 06/17/2019, 2:01 PM

## 2019-06-17 NOTE — Telephone Encounter (Signed)
[facility] Patient Advocate Encounter  Patient has been approved to receive Cresemba at no charge from [employer]. This program will voer the patient for 6 months and will ship the medication directly to her home.   [phone] is the direct number for her to call and request refills. They will ship her first refill to arrive in 24 hours.

## 2019-06-18 ENCOUNTER — Encounter: Payer: Self-pay | Admitting: Hematology and Oncology

## 2019-06-18 ENCOUNTER — Telehealth: Payer: Self-pay | Admitting: Radiation Oncology

## 2019-06-18 NOTE — Progress Notes (Signed)
Called pt to introduce myself as her Arboriculturist and to discuss financial assistance.  Pt has recently lost her insurance and tried to apply for Medicaid but she doesn't qualify.  I informed her that she will automatically receive a 56% discount but she can apply for an additional discount thru the hospital.  I will give her the application on [id number].  I also informed her of the [employer], went over what it covers and gave her the income requirement.  She would like to apply so she will bring proof of income on 06/19/19.  If approved I will give her an expense sheet.

## 2019-06-18 NOTE — Progress Notes (Signed)
Patient Care Team: Patient, [name] as PCP - General (General Practice) [name], MD as Referring Physician (Emergency Medicine)  DIAGNOSIS:    ICD-10-CM   1. Brain metastasis ([place])  C79.31   2. Malignant neoplasm of upper-inner quadrant of left breast in female, estrogen receptor positive ([place])  C50.212    Z17.0     SUMMARY OF ONCOLOGIC HISTORY: Oncology History  Breast cancer of upper-inner quadrant of left female breast ([place])  06/08/2012 Initial Diagnosis   invasive ductal carcinoma that was ER positive PR positive HER-2/neu positive measuring 3.1 cm by MRI criteria. Ki-67 was 70% HER-2 was amplified with a ratio 2.91   07/12/2012 - 07/17/2013 Neo-Adjuvant Chemotherapy   TCH 6 followed by Herceptin maintenance   12/11/2012 Surgery   Left breast lumpectomy: 1.8 cm tumor 1 positive sentinel node, axillary lymph node dissection 02/08/2013 showed 0/13 lymph nodes   03/25/2013 - 05/06/2013 Radiation Therapy   Adjuvant radiation therapy   06/05/2013 - 07/20/2017 Anti-estrogen oral therapy   Tamoxifen 20 mg daily   07/27/2017 Relapse/Recurrence   MRI Brain: 3.4 x 2.9 x 2.9 cm RIGHT frontal lobe mass with imaging characteristics of solitary metastasis. Extensive vasogenic edema resulting in 9 mm RIGHT to LEFT midline shift. Equivocal very early LEFT ventricle entrapment.    08/04/2017 Surgery   Rt frontal brain resection: Poorly differentiated tumor IHC suggests breast primary ER and PR Positive   08/25/2017 - 09/04/2017 Radiation Therapy   Stereotactic radiation   09/18/2017 -  Anti-estrogen oral therapy   Lapatinib with letrozole   12/18/2017 - 12/19/2017 Radiation Therapy   New right parietal lobe metastases status post [facility]   05/09/2019 Relapse/Recurrence   Interval increase in size of the enhancing nodular left internal mammary soft tissue 2.8 cm.  Redemonstrated enlarged supraclavicular, lower cervical and lower posterior cervical nodes unchanged.  Interval increase in  the bony erosion of the posterior and lateral left third rib, increasing soft tissue lesion eroding the left sternal body 3.1 cm was 2.5 cm.  Bronchiectatic changes   06/19/2019 -  Chemotherapy   The patient had ado-trastuzumab emtansine (KADCYLA) 180 mg in sodium chloride 0.9 % 250 mL chemo infusion, 3.6 mg/kg = 180 mg, Intravenous, Once, 0 of 6 cycles  for chemotherapy treatment.    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      CHIEF COMPLIANT: Follow-up of metastatic breast cancer   INTERVAL HISTORY: Melissa [name] is a 53 y.o. with above-mentioned history of metastatic breast cancer with brain metastasiss/presection who is currently [place] every 3 weeks. She underwent a liver biopsy on 06/05/19 for which pathology showed metastatic carcinoma, HER-2 positive (3+), ER/PR negative, Ki67 20%. Echo on 06/17/19 showed an ejection fraction of 60-65%. She presents to the clinic today to begin Kadcyla treatment.  She is complaining of worsening chest wall pain.  She has been taking half a tablet of oxycodone which gives her temporary relief but her symptoms seem to return back.  REVIEW OF SYSTEMS:   Constitutional: Denies fevers, chills or abnormal weight loss Eyes: Denies blurriness of vision Ears, nose, mouth, throat, and face: Denies mucositis or sore throat Respiratory: Denies cough, dyspnea or  wheezes, chest wall pain radiating to the left chest Cardiovascular: Denies palpitation, chest discomfort Gastrointestinal: Denies nausea, heartburn or change in bowel habits Skin: Denies abnormal skin rashes Lymphatics: Denies new lymphadenopathy or easy bruising Neurological: Denies numbness,  tingling or new weaknesses Behavioral/Psych: Mood is stable, no new changes  Extremities: No lower extremity edema Breast: denies any pain or lumps or nodules in either breasts All other systems were reviewed with the patient and are negative.  I have reviewed the past medical history, past surgical history, social history and family history with the patient and they are unchanged from previous note.  ALLERGIES:  is allergic to aspirin; protonix [pantoprazole]; and iodinated diagnostic agents.  MEDICATIONS:  Current Outpatient Medications  Medication Sig Dispense Refill  . albuterol (VENTOLIN HFA) 108 (90 BASE) MCG/ACT inhaler Inhale 2 puffs into the lungs every 6 (six) hours as needed. 1 Inhaler 3  . budesonide-formoterol (SYMBICORT) 160-4.5 MCG/ACT inhaler Inhale 2 puffs into the lungs 2 (two) times daily.    . Chlorpheniramine-Codeine 2-10 MG/5ML LIQD Take 5 mLs by mouth every 6 (six) hours as needed. (Patient not taking: Reported on 05/01/2019) 200 mL 1  . HYDROcodone-guaiFENesin 2.5-200 MG/5ML SOLN Take 5 mLs by mouth every 8 (eight) hours. (Patient not taking: Reported on 05/01/2019) 560 mL 0  . Isavuconazonium Sulfate (CRESEMBA) 186 MG CAPS Take 2 capsules (372 mg total) by mouth 3 (three) times daily. X 2 days. Followed by 2 capsules per day thereafter 60 capsule 6  . levalbuterol (XOPENEX) 0.63 MG/3ML nebulizer solution Take 3 mLs (0.63 mg total) by nebulization every 4 (four) hours as needed for wheezing or shortness of breath. 540 mL 6  . lidocaine-prilocaine (EMLA) cream Apply to affected area once 30 g 3  . Melatonin 5 MG TABS Take 5 mg by mouth at bedtime as needed.    . naproxen sodium (ALEVE) 220 MG tablet Take 440 mg by mouth 2 (two) times daily as needed (headache).     . oxyCODONE-acetaminophen (PERCOCET/ROXICET) 5-325 MG tablet Take 1 tablet by mouth every 6 (six) hours as needed for severe pain. (Patient taking differently: Take 1 tablet by mouth every 6 (six) hours as  needed for severe pain. ) 60 tablet 0  . predniSONE (DELTASONE) 50 MG tablet Take for contrast allergy prior to study. Take one tablet 13 hours before study, one tablet 7 hours before study,and one tablet 1 hour before study (Patient not taking: Reported on 05/01/2019) 30 tablet 0  . VIRTUSSIN A/C 100-10 MG/5ML syrup TK 5 MLS PO EVERY 8 HOURS     No current facility-administered medications for this visit.     PHYSICAL EXAMINATION: ECOG PERFORMANCE STATUS: 1 - Symptomatic but completely ambulatory  Vitals:   06/19/19 1111  BP: 106/64  Pulse: 84  Resp: 18  Temp: 98.7 F (37.1 C)  SpO2: 100%   Filed Weights   06/19/19 1111  Weight: 113 lb 11.2 oz (51.6 kg)    GENERAL: alert, no distress and comfortable SKIN: skin color, texture, turgor are normal, no rashes or significant lesions EYES: normal, Conjunctiva are pink and non-injected, sclera clear OROPHARYNX: no exudate, no erythema and lips, buccal mucosa, and tongue normal  NECK: supple, thyroid normal size, non-tender, without nodularity LYMPH: no palpable lymphadenopathy in the cervical, axillary or inguinal LUNGS: clear to auscultation and percussion with normal breathing effort HEART: regular rate & rhythm and no murmurs and no lower extremity edema ABDOMEN: abdomen soft, non-tender and normal bowel sounds MUSCULOSKELETAL: no cyanosis of  digits and no clubbing  NEURO: alert & oriented x 3 with fluent speech, no focal motor/sensory deficits EXTREMITIES: No lower extremity edema  LABORATORY DATA:  I have reviewed the data as listed CMP Latest Ref Rng & Units 06/19/2019 06/05/2019 05/14/2019  Glucose 70 - 99 mg/dL 76 89 87  BUN 6 - 20 mg/dL _0 Creatinine 0.44 - 1.00 mg/dL 0.86 0.98 1.01(H)  Sodium 135 - 145 mmol/L 141 138 138  Potassium 3.5 - 5.1 mmol/L 4.0 4.1 3.9  Chloride 98 - 111 mmol/L 105 104 102  CO2 22 - 32 mmol/L _1 Calcium 8.9 - 10.3 mg/dL 9.5 9.3 10.0  Total Protein 6.5 - 8.1 g/dL 7.5 7.4 8.2(H)   Total Bilirubin 0.3 - 1.2 mg/dL <0.2(L) 0.6 <0.2(L)  Alkaline Phos 38 - 126 U/L 147(H) 118 145(H)  AST 15 - 41 U/L 18 18 13(L)  ALT 0 - 44 U/L _2 Lab Results  Component Value Date   WBC 11.7 (H) 06/19/2019   HGB 10.0 (L) 06/19/2019   HCT 31.1 (L) 06/19/2019   MCV 88.1 06/19/2019   PLT 410 (H) 06/19/2019   NEUTROABS 7.6 06/19/2019    ASSESSMENT & PLAN:  Breast cancer of upper-inner quadrant of left female breast ([place]) 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 Anti Her 2 therapy with Lapatinibstarted 09/17/2017-05/14/2019: Stopped for progression 3.I discontinuedtamoxifen and started her on letrozole 2.5 mg daily.  05/14/2019 stopped for progression 4.  Stereotactic radiosurgery 12/19/2017 to the new right parietal lobe metastases. -------------------------------------------------------------------------------------------------------------------- Liver Biopsy 06/05/19: Metastatic cancer, ER/PR: 0%, Her 2: 3+ Positive, Ki 67: 20% Treatment Plan: Kadcyla q 3 weeks, today is cycle 1 Chest wall pain: Currently on pain medication.  It appears to have gotten worse over the last couple of days.  If her symptoms continue to be persistent then we will request Dr. Sondra Come to reevaluate her for palliative radiation. She wants to wait and see if the Kadcyla makes an improvement in her symptoms.  RTC in 3 weeks for cycle 2  No orders of the defined types were placed in this encounter.  The patient has a good understanding of the overall plan. she  agrees with it. she will call with any problems that may develop before the next visit here.  Nicholas Lose, MD 06/19/2019  Julious Oka Dorshimer, am acting as scribe for Dr. Nicholas Lose.  I have reviewed the above documentation for accuracy and completeness, and I agree with the above.

## 2019-06-18 NOTE — Telephone Encounter (Signed)
   Received voicemail message from Willey Blade inquiring if authorization had been started yet for patient's MRI without and without contract. Phoned her back. No answer. Left voicemail message that it does not appear it has (per above image.) Explained this RN would inform Mont Dutton, Trudee Kuster, and [employer] of this finding and one of them would phone her back.

## 2019-06-19 ENCOUNTER — Other Ambulatory Visit: Payer: Self-pay

## 2019-06-19 ENCOUNTER — Inpatient Hospital Stay: Payer: Self-pay

## 2019-06-19 ENCOUNTER — Encounter: Payer: Self-pay | Admitting: Hematology and Oncology

## 2019-06-19 ENCOUNTER — Inpatient Hospital Stay: Payer: Self-pay | Attending: Medical | Admitting: Hematology and Oncology

## 2019-06-19 VITALS — BP 106/65 | HR 80 | Temp 99.1°F | Resp 16

## 2019-06-19 VITALS — BP 106/64 | HR 84 | Temp 98.7°F | Resp 18 | Ht 62.0 in | Wt 113.7 lb

## 2019-06-19 DIAGNOSIS — Z5112 Encounter for antineoplastic immunotherapy: Secondary | ICD-10-CM | POA: Insufficient documentation

## 2019-06-19 DIAGNOSIS — Z171 Estrogen receptor negative status [ER-]: Secondary | ICD-10-CM

## 2019-06-19 DIAGNOSIS — Z170 Estrogen receptor positive status [ER+]: Secondary | ICD-10-CM | POA: Insufficient documentation

## 2019-06-19 DIAGNOSIS — C7931 Secondary malignant neoplasm of brain: Secondary | ICD-10-CM

## 2019-06-19 DIAGNOSIS — C50212 Malignant neoplasm of upper-inner quadrant of left female breast: Secondary | ICD-10-CM

## 2019-06-19 DIAGNOSIS — C50912 Malignant neoplasm of unspecified site of left female breast: Secondary | ICD-10-CM

## 2019-06-19 LAB — COMPREHENSIVE METABOLIC PANEL
ALT: 12 U/L (ref 0–44)
AST: 18 U/L (ref 15–41)
Albumin: 3.5 g/dL (ref 3.5–5.0)
Alkaline Phosphatase: 147 U/L — ABNORMAL HIGH (ref 38–126)
Anion gap: 9 (ref 5–15)
BUN: 12 mg/dL (ref 6–20)
CO2: 27 mmol/L (ref 22–32)
Calcium: 9.5 mg/dL (ref 8.9–10.3)
Chloride: 105 mmol/L (ref 98–111)
Creatinine, Ser: 0.86 mg/dL (ref 0.44–1.00)
GFR calc Af Amer: >60 mL/min (ref >60)
GFR calc non Af Amer: >60 mL/min (ref >60)
Glucose, Bld: 76 mg/dL (ref 70–99)
Potassium: 4 mmol/L (ref 3.5–5.1)
Sodium: 141 mmol/L (ref 135–145)
Total Bilirubin: <0.2 mg/dL — ABNORMAL LOW (ref 0.3–1.2)
Total Protein: 7.5 g/dL (ref 6.5–8.1)

## 2019-06-19 LAB — CBC WITH DIFFERENTIAL/PLATELET
Abs Immature Granulocytes: 0.04 10*3/uL (ref 0.00–0.07)
Basophils Absolute: 0.1 10*3/uL (ref 0.0–0.1)
Basophils Relative: 1 %
Eosinophils Absolute: 0.8 10*3/uL — ABNORMAL HIGH (ref 0.0–0.5)
Eosinophils Relative: 7 %
HCT: 31.1 % — ABNORMAL LOW (ref 36.0–46.0)
Hemoglobin: 10 g/dL — ABNORMAL LOW (ref 12.0–15.0)
Immature Granulocytes: 0 %
Lymphocytes Relative: 20 %
Lymphs Abs: 2.3 10*3/uL (ref 0.7–4.0)
MCH: 28.3 pg (ref 26.0–34.0)
MCHC: 32.2 g/dL (ref 30.0–36.0)
MCV: 88.1 fL (ref 80.0–100.0)
Monocytes Absolute: 1 10*3/uL (ref 0.1–1.0)
Monocytes Relative: 8 %
Neutro Abs: 7.6 10*3/uL (ref 1.7–7.7)
Neutrophils Relative %: 64 %
Platelets: 410 10*3/uL — ABNORMAL HIGH (ref 150–400)
RBC: 3.53 MIL/uL — ABNORMAL LOW (ref 3.87–5.11)
RDW: 21.9 % — ABNORMAL HIGH (ref 11.5–15.5)
WBC: 11.7 10*3/uL — ABNORMAL HIGH (ref 4.0–10.5)
nRBC: 0 % (ref 0.0–0.2)

## 2019-06-19 MED ORDER — DIPHENHYDRAMINE HCL 25 MG PO CAPS
50.0000 mg | ORAL_CAPSULE | Freq: Once | ORAL | Status: AC
  Administered 2019-06-19: 50 mg via ORAL

## 2019-06-19 MED ORDER — HEPARIN SOD (PORK) LOCK FLUSH 100 UNIT/ML IV SOLN
500.0000 [IU] | Freq: Once | INTRAVENOUS | Status: AC | PRN
  Administered 2019-06-19: 16:00:00 500 [IU]
  Filled 2019-06-19: qty 5

## 2019-06-19 MED ORDER — SODIUM CHLORIDE 0.9 % IV SOLN
Freq: Once | INTRAVENOUS | Status: AC
  Administered 2019-06-19: 13:00:00 via INTRAVENOUS
  Filled 2019-06-19: qty 250

## 2019-06-19 MED ORDER — ACETAMINOPHEN 325 MG PO TABS
650.0000 mg | ORAL_TABLET | Freq: Once | ORAL | Status: AC
  Administered 2019-06-19: 650 mg via ORAL

## 2019-06-19 MED ORDER — SODIUM CHLORIDE 0.9 % IV SOLN
3.6000 mg/kg | Freq: Once | INTRAVENOUS | Status: AC
  Administered 2019-06-19: 180 mg via INTRAVENOUS
  Filled 2019-06-19: qty 9

## 2019-06-19 MED ORDER — SODIUM CHLORIDE 0.9% FLUSH
10.0000 mL | INTRAVENOUS | Status: DC | PRN
  Administered 2019-06-19: 10 mL
  Filled 2019-06-19: qty 10

## 2019-06-19 MED ORDER — DIPHENHYDRAMINE HCL 25 MG PO CAPS
ORAL_CAPSULE | ORAL | Status: AC
  Filled 2019-06-19: qty 2

## 2019-06-19 NOTE — Patient Instructions (Signed)
[place] Discharge Instructions for Patients Receiving Chemotherapy  Today you received the following chemotherapy agents Kadcyla  To help prevent nausea and vomiting after your treatment, we encourage you to take your nausea medication as prescribed.   If you develop nausea and vomiting that is not controlled by your nausea medication, call the clinic.   BELOW ARE SYMPTOMS THAT SHOULD BE REPORTED IMMEDIATELY:  *FEVER GREATER THAN 100.5 F  *CHILLS WITH OR WITHOUT FEVER  NAUSEA AND VOMITING THAT IS NOT CONTROLLED WITH YOUR NAUSEA MEDICATION  *UNUSUAL SHORTNESS OF BREATH  *UNUSUAL BRUISING OR BLEEDING  TENDERNESS IN MOUTH AND THROAT WITH OR WITHOUT PRESENCE OF ULCERS  *URINARY PROBLEMS  *BOWEL PROBLEMS  UNUSUAL RASH Items with * indicate a potential emergency and should be followed up as soon as possible.  Feel free to call the clinic should you have any questions or concerns. The clinic phone number is [phone].  Please show the [place] at check-in to the Emergency Department and triage nurse.  Ado-Trastuzumab Emtansine for injection What is this medicine? ADO-TRASTUZUMAB EMTANSINE (ADD oh traz TOO zuh mab em TAN zine) is a monoclonal antibody combined with chemotherapy. It is used to treat breast cancer. This medicine may be used for other purposes; ask your health care provider or pharmacist if you have questions. COMMON BRAND NAME(S): Kadcyla What should I tell my health care provider before I take this medicine? They need to know if you have any of these conditions:  heart disease  heart failure  infection (especially a virus infection such as chickenpox, cold sores, or herpes)  liver disease  lung or breathing disease, like asthma  tingling of the fingers or toes, or other nerve disorder  an unusual or allergic reaction to ado-trastuzumab emtansine, other medications, foods, dyes, or preservatives  pregnant or trying to get  pregnant  breast-feeding How should I use this medicine? This medicine is for infusion into a vein. It is given by a health care professional in a hospital or clinic setting. Talk to your pediatrician regarding the use of this medicine in children. Special care may be needed. Overdosage: If you think you have taken too much of this medicine contact a poison control center or emergency room at once. NOTE: This medicine is only for you. Do not share this medicine with others. What if I miss a dose? It is important not to miss your dose. Call your doctor or health care professional if you are unable to keep an appointment. What may interact with this medicine? This medicine may also interact with the following medications:  atazanavir  boceprevir  clarithromycin  delavirdine  indinavir  dalfopristin; quinupristin  isoniazid, INH  itraconazole  ketoconazole  nefazodone  nelfinavir  ritonavir  telaprevir  telithromycin  tipranavir  voriconazole This list may not describe all possible interactions. Give your health care provider a list of all the medicines, herbs, non-prescription drugs, or dietary supplements you use. Also tell them if you smoke, drink alcohol, or use illegal drugs. Some items may interact with your medicine. What should I watch for while using this medicine? Visit your doctor for checks on your progress. This drug may make you feel generally unwell. This is not uncommon, as chemotherapy can affect healthy cells as well as cancer cells. Report any side effects. Continue your course of treatment even though you feel ill unless your doctor tells you to stop. You may need blood work done while you are taking this medicine.  Call your doctor or health care professional for advice if you get a fever, chills or sore throat, or other symptoms of a cold or flu. Do not treat yourself. This drug decreases your body's ability to fight infections. Try to avoid being  around people who are sick. Be careful brushing and flossing your teeth or using a toothpick because you may get an infection or bleed more easily. If you have any dental work done, tell your dentist you are receiving this medicine. Avoid taking products that contain aspirin, acetaminophen, ibuprofen, naproxen, or ketoprofen unless instructed by your doctor. These medicines may hide a fever. Do not become pregnant while taking this medicine or for 7 months after stopping it, men with female partners should use contraception during treatment and for 4 months after the last dose. Women should inform their doctor if they wish to become pregnant or think they might be pregnant. There is a potential for serious side effects to an unborn child. Do not breast-feed an infant while taking this medicine or for 7 months after the last dose. Men who have a partner who is pregnant or who is capable of becoming pregnant should use a condom during sexual activity while taking this medicine and for 4 months after stopping it. Men should inform their doctors if they wish to father a child. This medicine may lower sperm counts. Talk to your health care professional or pharmacist for more information. What side effects may I notice from receiving this medicine? Side effects that you should report to your doctor or health care professional as soon as possible:  allergic reactions like skin rash, itching or hives, swelling of the face, lips, or tongue  breathing problems  chest pain or palpitations  fever or chills, sore throat  general ill feeling or flu-like symptoms  light-colored stools  nausea, vomiting  pain, tingling, numbness in the hands or feet  signs and symptoms of bleeding such as bloody or black, tarry stools; red or dark-brown urine; spitting up blood or brown material that looks like coffee grounds; red spots on the skin; unusual bruising or bleeding from the eye, gums, or nose  swelling of the  legs or ankles  yellowing of the eyes or skin Side effects that usually do not require medical attention (report to your doctor or health care professional if they continue or are bothersome):  changes in taste  constipation  dizziness  headache  joint pain  muscle pain  trouble sleeping  unusually weak or tired This list may not describe all possible side effects. Call your doctor for medical advice about side effects. You may report side effects to FDA at 1-800-FDA-1088. Where should I keep my medicine? This drug is given in a hospital or clinic and will not be stored at home. NOTE: This sheet is a summary. It may not cover all possible information. If you have questions about this medicine, talk to your doctor, pharmacist, or health care provider.  2020 Elsevier/Gold Standard (2017-11-24 10:03:15)  Coronavirus (COVID-19) Are you at risk?  Are you at risk for the Coronavirus (COVID-19)?  To be considered HIGH RISK for Coronavirus (COVID-19), you have to meet the following criteria:  . Traveled to [place], [place], [place], [place] or [place]; or in the [place] to [place], [place], [place], or [place]; and have fever, cough, and shortness of breath within the last 2 weeks of travel OR . Been in close contact with a person diagnosed with COVID-19 within  the last 2 weeks and have fever, cough, and shortness of breath . IF YOU DO NOT MEET THESE CRITERIA, YOU ARE CONSIDERED LOW RISK FOR COVID-19.  What to do if you are HIGH RISK for COVID-19?  Marland Kitchen If you are having a medical emergency, call 911. . Seek medical care right away. Before you go to a doctor's office, urgent care or emergency department, call ahead and tell them about your recent travel, contact with someone diagnosed with COVID-19, and your symptoms. You should receive instructions from your physician's office regarding next steps of care.  . When you arrive at healthcare provider, tell the healthcare staff  immediately you have returned from visiting [place], [place], [place], [place] or [place]; or traveled in the [place] to [place], [place], [place], or [place]; in the last two weeks or you have been in close contact with a person diagnosed with COVID-19 in the last 2 weeks.   . Tell the health care staff about your symptoms: fever, cough and shortness of breath. . After you have been seen by a medical provider, you will be either: o Tested for (COVID-19) and discharged home on quarantine except to seek medical care if symptoms worsen, and asked to  - Stay home and avoid contact with others until you get your results (4-5 days)  - Avoid travel on public transportation if possible (such as bus, train, or airplane) or o Sent to the Emergency Department by EMS for evaluation, COVID-19 testing, and possible admission depending on your condition and test results.  What to do if you are LOW RISK for COVID-19?  Reduce your risk of any infection by using the same precautions used for avoiding the common cold or flu:  Marland Kitchen Wash your hands often with soap and warm water for at least 20 seconds.  If soap and water are not readily available, use an alcohol-based hand sanitizer with at least 60% alcohol.  . If coughing or sneezing, cover your mouth and nose by coughing or sneezing into the elbow areas of your shirt or coat, into a tissue or into your sleeve (not your hands). . Avoid shaking hands with others and consider head nods or verbal greetings only. . Avoid touching your eyes, nose, or mouth with unwashed hands.  . Avoid close contact with people who are sick. . Avoid places or events with large numbers of people in one location, like concerts or sporting events. . Carefully consider travel plans you have or are making. . If you are planning any travel outside or inside the [name], visit the CDC's Travelers' Health webpage for the latest health notices. . If you have some symptoms but not all  symptoms, continue to monitor at home and seek medical attention if your symptoms worsen. . If you are having a medical emergency, call 911.   [place] / e-Visit: [email or web address]         [facility]: [facility]: [id number]                   [facility]: [phone]

## 2019-06-19 NOTE — Assessment & Plan Note (Signed)
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 Anti Her 2 therapy with Lapatinibstarted 09/17/2017-05/14/2019: Stopped for progression 3.I discontinuedtamoxifen and started her on letrozole 2.5 mg daily.  05/14/2019 stopped for progression 4.  Stereotactic radiosurgery 12/19/2017 to the new right parietal lobe metastases. -------------------------------------------------------------------------------------------------------------------- Liver Biopsy 06/05/19: Metastatic cancer, ER/PR: 0%, Her 2: 3+ Positive, Ki 67: 20% Treatment Plan: Kadcyla q 3 weeks, today is cycle 1  RTC in 3 weeks for cycle 2

## 2019-06-19 NOTE — Progress Notes (Signed)
Pt is approved for the $1000 [employer] grant.  

## 2019-06-20 ENCOUNTER — Telehealth: Payer: Self-pay | Admitting: *Deleted

## 2019-06-20 ENCOUNTER — Other Ambulatory Visit: Payer: BC Managed Care – PPO

## 2019-06-21 ENCOUNTER — Telehealth: Payer: Self-pay | Admitting: Internal Medicine

## 2019-06-21 MED ORDER — BUDESONIDE-FORMOTEROL FUMARATE 160-4.5 MCG/ACT IN AERO: 2.0000 | INHALATION_SPRAY | Freq: Two times a day (BID) | RESPIRATORY_TRACT | 3 refills | Status: DC

## 2019-06-21 NOTE — Telephone Encounter (Signed)
Spoke with patient. She was requesting a refill on her Symbcort to be sent to [facility] in [place]. Advised her that I would go ahead and call in the RX for her.   Also advised her that we have a zero copay card that states it is for commercially covered patients but the back of the card states uninsured patients may use the card as well. She wishes to have this mailed to her. Verified her address.   Nothing further needed at time of call.

## 2019-06-24 ENCOUNTER — Inpatient Hospital Stay: Payer: Self-pay

## 2019-06-24 ENCOUNTER — Telehealth: Payer: Self-pay | Admitting: *Deleted

## 2019-06-24 NOTE — Telephone Encounter (Signed)
Received call from pt stating the lymph nodes near her left clavicle seem to be getting bigger and feel firm to the touch.  Pt also states lymph notes also seem to be moving more to her neck area as well.  Pt requesting apt with MD for assessment and recommendations.  Apt scheduled and pt verbalized understanding.

## 2019-06-24 NOTE — Progress Notes (Signed)
Patient Care Team: Patient, [name] as PCP - General (General Practice) [name], MD as Referring Physician (Emergency Medicine)  DIAGNOSIS:    ICD-10-CM   1. Metastasis to brain [facility])  C79.31   2. 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 Malignant neoplasm of upper-inner quadrant of left breast in female, estrogen receptor negative ([place])  C50.212    Z17.1     SUMMARY OF ONCOLOGIC HISTORY: Oncology History  Breast cancer of upper-inner quadrant of left female breast ([place])  06/08/2012 Initial Diagnosis   invasive ductal carcinoma that was ER positive PR positive HER-2/neu positive measuring 3.1 cm by MRI criteria. Ki-67 was 70% HER-2 was amplified with a ratio 2.91   07/12/2012 - 07/17/2013 Neo-Adjuvant Chemotherapy   TCH 6 followed by Herceptin maintenance   12/11/2012 Surgery   Left breast lumpectomy: 1.8 cm tumor 1 positive sentinel node, axillary lymph node dissection 02/08/2013 showed 0/13 lymph nodes   03/25/2013 - 05/06/2013 Radiation Therapy   Adjuvant radiation therapy   06/05/2013 - 07/20/2017 Anti-estrogen oral therapy   Tamoxifen 20 mg daily   07/27/2017 Relapse/Recurrence   MRI Brain: 3.4 x 2.9 x 2.9 cm RIGHT frontal lobe mass with imaging characteristics of solitary metastasis. Extensive vasogenic edema resulting in 9 mm RIGHT to LEFT midline shift. Equivocal very early LEFT ventricle entrapment.    08/04/2017 Surgery   Rt frontal brain resection: Poorly differentiated tumor IHC suggests breast primary ER and PR Positive   08/25/2017 - 09/04/2017 Radiation Therapy   Stereotactic radiation   09/18/2017 -  Anti-estrogen oral therapy   Lapatinib with letrozole   12/18/2017 - 12/19/2017 Radiation Therapy   New right parietal lobe metastases status post [facility]   05/09/2019 Relapse/Recurrence   Interval increase in size of the enhancing nodular left internal mammary soft tissue 2.8 cm.  Redemonstrated enlarged supraclavicular, lower  cervical and lower posterior cervical nodes unchanged.  Interval increase in the bony erosion of the posterior and lateral left third rib, increasing soft tissue lesion eroding the left sternal body 3.1 cm was 2.5 cm.  Bronchiectatic changes   06/19/2019 -  Chemotherapy   The patient had ado-trastuzumab emtansine (KADCYLA) 180 mg in sodium chloride 0.9 % 250 mL chemo infusion, 3.6 mg/kg = 180 mg, Intravenous, Once, 1 of 6 cycles Administration: 180 mg (06/19/2019)  for chemotherapy treatment.    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      CHIEF COMPLIANT: Follow-up of metastatic breast cancer, palpable enlarged lymph nodes  INTERVAL HISTORY: Melissa [name] is a 53 y.o. with above-mentioned history of metastatic breast cancer with brain metastasiss/presection who is currently [place] every 3 weeks. She presents to the clinic today for follow up of palpable lymph nodes in her left clavicle, and neck.  She tolerated the treatment fairly well. Her complaint today is enlarging lymph nodes in the left supraclavicular area that have become more prominent and slightly tender.  REVIEW OF SYSTEMS:   Constitutional: Denies fevers, chills or abnormal weight loss Eyes: Denies blurriness of vision Ears, nose, mouth, throat, and face: Denies mucositis or sore throat  Respiratory: Denies cough, dyspnea or wheezes Cardiovascular: Denies palpitation, chest discomfort Gastrointestinal: Denies nausea, heartburn or change in bowel habits Skin: Denies abnormal skin rashes Lymphatics: Palpable left supraclavicular lymph nodes getting slightly larger Neurological: Denies  numbness, tingling or new weaknesses Behavioral/Psych: Mood is stable, no new changes  Extremities: No lower extremity edema Breast: denies any pain or lumps or nodules in either breasts All other systems were reviewed with the patient and are negative.  I have reviewed the past medical history, past surgical history, social history and family history with the patient and they are unchanged from previous note.  ALLERGIES:  is allergic to aspirin; protonix [pantoprazole]; and iodinated diagnostic agents.  MEDICATIONS:  Current Outpatient Medications  Medication Sig Dispense Refill  . albuterol (VENTOLIN HFA) 108 (90 BASE) MCG/ACT inhaler Inhale 2 puffs into the lungs every 6 (six) hours as needed. 1 Inhaler 3  . budesonide-formoterol (SYMBICORT) 160-4.5 MCG/ACT inhaler Inhale 2 puffs into the lungs 2 (two) times daily. 1 Inhaler 3  . Chlorpheniramine-Codeine 2-10 MG/5ML LIQD Take 5 mLs by mouth every 6 (six) hours as needed. (Patient not taking: Reported on 05/01/2019) 200 mL 1  . HYDROcodone-guaiFENesin 2.5-200 MG/5ML SOLN Take 5 mLs by mouth every 8 (eight) hours. (Patient not taking: Reported on 05/01/2019) 560 mL 0  . Isavuconazonium Sulfate (CRESEMBA) 186 MG CAPS Take 2 capsules (372 mg total) by mouth 3 (three) times daily. X 2 days. Followed by 2 capsules per day thereafter 60 capsule 6  . levalbuterol (XOPENEX) 0.63 MG/3ML nebulizer solution Take 3 mLs (0.63 mg total) by nebulization every 4 (four) hours as needed for wheezing or shortness of breath. 540 mL 6  . lidocaine-prilocaine (EMLA) cream Apply to affected area once 30 g 3  . Melatonin 5 MG TABS Take 5 mg by mouth at bedtime as needed.    . naproxen sodium (ALEVE) 220 MG tablet Take 440 mg by mouth 2 (two) times daily as needed (headache).     . oxyCODONE-acetaminophen (PERCOCET/ROXICET) 5-325 MG tablet Take 1 tablet by mouth every 6 (six) hours as needed for severe pain. (Patient taking differently: Take 1 tablet by mouth every 6  (six) hours as needed for severe pain. ) 60 tablet 0  . predniSONE (DELTASONE) 50 MG tablet Take for contrast allergy prior to study. Take one tablet 13 hours before study, one tablet 7 hours before study,and one tablet 1 hour before study (Patient not taking: Reported on 05/01/2019) 30 tablet 0  . VIRTUSSIN A/C 100-10 MG/5ML syrup TK 5 MLS PO EVERY 8 HOURS     No current facility-administered medications for this visit.    PHYSICAL EXAMINATION: ECOG PERFORMANCE STATUS: 1 - Symptomatic but completely ambulatory  Vitals:   06/25/19 1001  BP: 108/60  Pulse: 86  Resp: 18  Temp: 98.5 F (36.9 C)  SpO2: 100%   Filed Weights   06/25/19 1001  Weight: 114 lb 9.6 oz (52 kg)    GENERAL: alert, no distress and comfortable SKIN: skin color, texture, turgor are normal, no rashes or significant lesions EYES: normal, Conjunctiva are pink and non-injected, sclera clear OROPHARYNX: no exudate, no erythema and lips, buccal mucosa, and tongue normal  NECK: supple, thyroid normal size, non-tender, without nodularity LYMPH: Palpable supraclavicular lymph nodes on the left side getting larger LUNGS: clear to auscultation and percussion with normal breathing effort HEART: regular rate & rhythm and no murmurs and no lower extremity edema ABDOMEN: abdomen soft, non-tender and normal bowel sounds MUSCULOSKELETAL: no cyanosis of  digits and no clubbing  NEURO: alert & oriented x 3 with fluent speech, no focal motor/sensory deficits EXTREMITIES: No lower extremity edema  LABORATORY DATA:  I have reviewed the data as listed CMP Latest Ref Rng & Units 06/19/2019 06/05/2019 05/14/2019  Glucose 70 - 99 mg/dL 76 89 87  BUN 6 - 20 mg/dL _0 Creatinine 0.44 - 1.00 mg/dL 0.86 0.98 1.01(H)  Sodium 135 - 145 mmol/L 141 138 138  Potassium 3.5 - 5.1 mmol/L 4.0 4.1 3.9  Chloride 98 - 111 mmol/L 105 104 102  CO2 22 - 32 mmol/L _1 Calcium 8.9 - 10.3 mg/dL 9.5 9.3 10.0  Total Protein 6.5 - 8.1 g/dL 7.5  7.4 8.2(H)  Total Bilirubin 0.3 - 1.2 mg/dL <0.2(L) 0.6 <0.2(L)  Alkaline Phos 38 - 126 U/L 147(H) 118 145(H)  AST 15 - 41 U/L 18 18 13(L)  ALT 0 - 44 U/L _2 Lab Results  Component Value Date   WBC 11.7 (H) 06/19/2019   HGB 10.0 (L) 06/19/2019   HCT 31.1 (L) 06/19/2019   MCV 88.1 06/19/2019   PLT 410 (H) 06/19/2019   NEUTROABS 7.6 06/19/2019    ASSESSMENT & PLAN:  Breast cancer of upper-inner quadrant of left female breast ([place]) 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 Anti Her 2 therapy with Lapatinibstarted 09/17/2017-05/14/2019: Stopped for progression 3.I discontinuedtamoxifen and started her on letrozole 2.5 mg daily.05/14/2019 stopped for progression 4.Stereotactic radiosurgery 12/19/2017 to the new right parietal lobe metastases. -------------------------------------------------------------------------------------------------------------------- Liver Biopsy 06/05/19: Metastatic cancer, ER/PR: 0%, Her 2: 3+ Positive, Ki 67: 20%  Treatment Plan: Kadcyla q 3 weeks, today is cycle 1 day 8 Chest wall pain: Currently on pain medication.   She wants to wait and see if the Kadcyla makes an improvement in her symptoms.  Kadcyla toxicities: 1.  Mild diarrhea 2.  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  Left supraclavicular lymph nodes: Getting larger.  I discussed with her that since this is only 1 dose of treatment we should continue to watch and monitor.  After 3 doses we may consider doing a scan.  RTC in 2  weeks for cycle 2    No orders of the defined types were placed in this encounter.  The patient has a good understanding of the overall plan. she agrees with it. she will call with any problems that may develop before the next visit here.  Nicholas Lose, MD 06/25/2019  Julious Oka Dorshimer, am acting as scribe for Dr. Nicholas Lose.  I have reviewed the above document for accuracy and completeness, and I agree with the above.

## 2019-06-25 ENCOUNTER — Other Ambulatory Visit: Payer: Self-pay

## 2019-06-25 ENCOUNTER — Inpatient Hospital Stay (HOSPITAL_BASED_OUTPATIENT_CLINIC_OR_DEPARTMENT_OTHER): Payer: Self-pay | Admitting: Hematology and Oncology

## 2019-06-25 VITALS — BP 108/60 | HR 86 | Temp 98.5°F | Resp 18 | Ht 62.0 in | Wt 114.6 lb

## 2019-06-25 DIAGNOSIS — C50912 Malignant neoplasm of unspecified site of left female breast: Secondary | ICD-10-CM

## 2019-06-25 DIAGNOSIS — C7931 Secondary malignant neoplasm of brain: Secondary | ICD-10-CM

## 2019-06-25 DIAGNOSIS — Z171 Estrogen receptor negative status [ER-]: Secondary | ICD-10-CM

## 2019-06-25 DIAGNOSIS — C50212 Malignant neoplasm of upper-inner quadrant of left female breast: Secondary | ICD-10-CM

## 2019-06-25 NOTE — Assessment & Plan Note (Signed)
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 Anti Her 2 therapy with Lapatinibstarted 09/17/2017-05/14/2019: Stopped for progression 3.I discontinuedtamoxifen and started her on letrozole 2.5 mg daily.05/14/2019 stopped for progression 4.Stereotactic radiosurgery 12/19/2017 to the new right parietal lobe metastases. -------------------------------------------------------------------------------------------------------------------- Liver Biopsy 06/05/19: Metastatic cancer, ER/PR: 0%, Her 2: 3+ Positive, Ki 67: 20%  Treatment Plan: Kadcyla q 3 weeks, today is cycle 2 Chest wall pain: Currently on pain medication.  It appears to have gotten worse over the last couple of days.  If her symptoms continue to be persistent then we will request Dr. Sondra Come to reevaluate her for palliative radiation. She wants to wait and see if the Kadcyla makes an improvement in her symptoms.  Kadcyla toxicities:  RTC in 3 weeks for cycle 3

## 2019-06-26 ENCOUNTER — Ambulatory Visit: Payer: Self-pay | Admitting: Urology

## 2019-07-02 ENCOUNTER — Telehealth: Payer: Self-pay | Admitting: Internal Medicine

## 2019-07-02 NOTE — Telephone Encounter (Signed)
Called and spoke with pt letting her know that we had a savings card that I would put up front for her to come by office to get. Pt verbalized understanding. Savings card has been placed in file cabinet up front. Nothing further needed.

## 2019-07-03 ENCOUNTER — Telehealth: Payer: Self-pay | Admitting: Internal Medicine

## 2019-07-03 ENCOUNTER — Encounter: Payer: Self-pay | Admitting: Pharmacy Technician

## 2019-07-03 MED ORDER — BUDESONIDE-FORMOTEROL FUMARATE 160-4.5 MCG/ACT IN AERO: 2.0000 | INHALATION_SPRAY | Freq: Two times a day (BID) | RESPIRATORY_TRACT | 3 refills | Status: DC

## 2019-07-03 NOTE — Telephone Encounter (Signed)
Went out to the lobby to speak to pt. Pt said she tried to call the number on the symbicort savings card that she came to pick up today had expired. I provided pt with [employer]'s phone number for her to contact them to see if they might be able to give her info to see if they are no longer using those savings cards. Stated to her that we no longer carry symbicort samples due to now having Breztri which is a triple therapy inhaler. Stated to pt after calling [employer] if she was still having issues and wanted to see if she could be switched to a different inhaler to call us at that point and she verbalized understanding.  Nothing further needed.

## 2019-07-03 NOTE — Telephone Encounter (Signed)
Called and spoke with pt. Pt said she was able to get approved for patient assistance for symbicort and needed to have Rx faxed to patient assistance at provided fax number. Rx has been printed and faxed for pt. Nothing further needed.

## 2019-07-03 NOTE — Progress Notes (Signed)
The patient is approved for drug assistance by [place] for [employer].  Enrollment is effective until 06/26/21 and is based on insurance denial.  Drug replacement begins on 06/19/19.

## 2019-07-10 ENCOUNTER — Inpatient Hospital Stay: Payer: Self-pay

## 2019-07-10 ENCOUNTER — Other Ambulatory Visit: Payer: Self-pay

## 2019-07-10 ENCOUNTER — Ambulatory Visit: Payer: Self-pay | Admitting: Hematology and Oncology

## 2019-07-10 VITALS — BP 106/73 | HR 75 | Temp 98.6°F | Resp 18 | Wt 114.5 lb

## 2019-07-10 DIAGNOSIS — C7931 Secondary malignant neoplasm of brain: Secondary | ICD-10-CM

## 2019-07-10 DIAGNOSIS — C50912 Malignant neoplasm of unspecified site of left female breast: Secondary | ICD-10-CM

## 2019-07-10 DIAGNOSIS — C50212 Malignant neoplasm of upper-inner quadrant of left female breast: Secondary | ICD-10-CM

## 2019-07-10 DIAGNOSIS — Z95828 Presence of other vascular implants and grafts: Secondary | ICD-10-CM

## 2019-07-10 LAB — CBC WITH DIFFERENTIAL/PLATELET
Abs Immature Granulocytes: 0.02 10*3/uL (ref 0.00–0.07)
Basophils Absolute: 0.1 10*3/uL (ref 0.0–0.1)
Basophils Relative: 1 %
Eosinophils Absolute: 0.7 10*3/uL — ABNORMAL HIGH (ref 0.0–0.5)
Eosinophils Relative: 6 %
HCT: 30.6 % — ABNORMAL LOW (ref 36.0–46.0)
Hemoglobin: 9.7 g/dL — ABNORMAL LOW (ref 12.0–15.0)
Immature Granulocytes: 0 %
Lymphocytes Relative: 25 %
Lymphs Abs: 2.8 10*3/uL (ref 0.7–4.0)
MCH: 27.2 pg (ref 26.0–34.0)
MCHC: 31.7 g/dL (ref 30.0–36.0)
MCV: 86 fL (ref 80.0–100.0)
Monocytes Absolute: 0.7 10*3/uL (ref 0.1–1.0)
Monocytes Relative: 7 %
Neutro Abs: 7 10*3/uL (ref 1.7–7.7)
Neutrophils Relative %: 61 %
Platelets: 475 10*3/uL — ABNORMAL HIGH (ref 150–400)
RBC: 3.56 MIL/uL — ABNORMAL LOW (ref 3.87–5.11)
RDW: 20.5 % — ABNORMAL HIGH (ref 11.5–15.5)
WBC: 11.3 10*3/uL — ABNORMAL HIGH (ref 4.0–10.5)
nRBC: 0 % (ref 0.0–0.2)

## 2019-07-10 LAB — COMPREHENSIVE METABOLIC PANEL
ALT: 12 U/L (ref 0–44)
AST: 20 U/L (ref 15–41)
Albumin: 3.5 g/dL (ref 3.5–5.0)
Alkaline Phosphatase: 164 U/L — ABNORMAL HIGH (ref 38–126)
Anion gap: 12 (ref 5–15)
BUN: 11 mg/dL (ref 6–20)
CO2: 25 mmol/L (ref 22–32)
Calcium: 9.8 mg/dL (ref 8.9–10.3)
Chloride: 103 mmol/L (ref 98–111)
Creatinine, Ser: 0.87 mg/dL (ref 0.44–1.00)
GFR calc Af Amer: >60 mL/min (ref >60)
GFR calc non Af Amer: >60 mL/min (ref >60)
Glucose, Bld: 84 mg/dL (ref 70–99)
Potassium: 4.3 mmol/L (ref 3.5–5.1)
Sodium: 140 mmol/L (ref 135–145)
Total Bilirubin: <0.2 mg/dL — ABNORMAL LOW (ref 0.3–1.2)
Total Protein: 7.9 g/dL (ref 6.5–8.1)

## 2019-07-10 MED ORDER — DIPHENHYDRAMINE HCL 25 MG PO CAPS: 50.0000 mg | ORAL_CAPSULE | Freq: Once | ORAL | Status: DC

## 2019-07-10 MED ORDER — DIPHENHYDRAMINE HCL 25 MG PO CAPS
ORAL_CAPSULE | ORAL | Status: AC
  Filled 2019-07-10: qty 1

## 2019-07-10 MED ORDER — SODIUM CHLORIDE 0.9% FLUSH
10.0000 mL | INTRAVENOUS | Status: DC | PRN
  Administered 2019-07-10: 15:00:00 10 mL via INTRAVENOUS
  Filled 2019-07-10: qty 10

## 2019-07-10 MED ORDER — ACETAMINOPHEN 325 MG PO TABS
650.0000 mg | ORAL_TABLET | Freq: Once | ORAL | Status: AC
  Administered 2019-07-10: 650 mg via ORAL

## 2019-07-10 MED ORDER — SODIUM CHLORIDE 0.9% FLUSH
10.0000 mL | INTRAVENOUS | Status: DC | PRN
  Administered 2019-07-10: 10 mL
  Filled 2019-07-10: qty 10

## 2019-07-10 MED ORDER — ACETAMINOPHEN 325 MG PO TABS
ORAL_TABLET | ORAL | Status: AC
  Filled 2019-07-10: qty 2

## 2019-07-10 MED ORDER — SODIUM CHLORIDE 0.9 % IV SOLN
3.6000 mg/kg | Freq: Once | INTRAVENOUS | Status: AC
  Administered 2019-07-10: 180 mg via INTRAVENOUS
  Filled 2019-07-10: qty 9

## 2019-07-10 MED ORDER — DIPHENHYDRAMINE HCL 25 MG PO CAPS
25.0000 mg | ORAL_CAPSULE | Freq: Once | ORAL | Status: AC
  Administered 2019-07-10: 25 mg via ORAL

## 2019-07-10 MED ORDER — HEPARIN SOD (PORK) LOCK FLUSH 100 UNIT/ML IV SOLN
500.0000 [IU] | Freq: Once | INTRAVENOUS | Status: AC | PRN
  Administered 2019-07-10: 500 [IU]
  Filled 2019-07-10: qty 5

## 2019-07-10 MED ORDER — SODIUM CHLORIDE 0.9 % IV SOLN
Freq: Once | INTRAVENOUS | Status: AC
  Filled 2019-07-10: qty 250

## 2019-07-10 NOTE — Patient Instructions (Signed)

## 2019-07-10 NOTE — Patient Instructions (Signed)
[facility] Discharge Instructions for Patients Receiving Chemotherapy  Today you received the following chemotherapy agents :  Kadcyla.  To help prevent nausea and vomiting after your treatment, we encourage you to take your nausea medication as prescribed.   If you develop nausea and vomiting that is not controlled by your nausea medication, call the clinic.   BELOW ARE SYMPTOMS THAT SHOULD BE REPORTED IMMEDIATELY:  *FEVER GREATER THAN 100.5 F  *CHILLS WITH OR WITHOUT FEVER  NAUSEA AND VOMITING THAT IS NOT CONTROLLED WITH YOUR NAUSEA MEDICATION  *UNUSUAL SHORTNESS OF BREATH  *UNUSUAL BRUISING OR BLEEDING  TENDERNESS IN MOUTH AND THROAT WITH OR WITHOUT PRESENCE OF ULCERS  *URINARY PROBLEMS  *BOWEL PROBLEMS  UNUSUAL RASH Items with * indicate a potential emergency and should be followed up as soon as possible.  Feel free to call the clinic should you have any questions or concerns. The clinic phone number is [phone].  Please show the CHEMO ALERT CARD at check-in to the Emergency Department and triage nurse.   

## 2019-07-11 ENCOUNTER — Telehealth: Payer: Self-pay

## 2019-07-11 NOTE — Telephone Encounter (Signed)
Pt is aware of 13 hour prep prior to CT scan due to allergy.  Pt voiced she had prescription for prednisone from previous scan.

## 2019-07-15 ENCOUNTER — Other Ambulatory Visit: Payer: BC Managed Care – PPO

## 2019-07-15 ENCOUNTER — Encounter (HOSPITAL_COMMUNITY): Payer: Self-pay

## 2019-07-15 ENCOUNTER — Telehealth: Payer: Self-pay | Admitting: Hematology and Oncology

## 2019-07-15 ENCOUNTER — Ambulatory Visit (HOSPITAL_COMMUNITY): Admission: RE | Admit: 2019-07-15 | Payer: Self-pay | Source: Ambulatory Visit

## 2019-07-15 ENCOUNTER — Other Ambulatory Visit: Payer: Self-pay

## 2019-07-15 NOTE — Telephone Encounter (Signed)
Scheduled appt per 12/31 sch message - pt is aware of changed on 1/20

## 2019-07-16 ENCOUNTER — Other Ambulatory Visit: Payer: Self-pay

## 2019-07-16 ENCOUNTER — Ambulatory Visit: Payer: BC Managed Care – PPO | Admitting: Hematology and Oncology

## 2019-07-17 ENCOUNTER — Other Ambulatory Visit: Payer: Self-pay

## 2019-07-19 ENCOUNTER — Encounter (HOSPITAL_COMMUNITY): Payer: Self-pay

## 2019-07-19 ENCOUNTER — Other Ambulatory Visit: Payer: Self-pay

## 2019-07-19 ENCOUNTER — Ambulatory Visit (HOSPITAL_COMMUNITY)
Admission: RE | Admit: 2019-07-19 | Discharge: 2019-07-19 | Disposition: A | Discharge status: Home / Self Care | Payer: PRIVATE HEALTH INSURANCE | Source: Ambulatory Visit | Attending: Hematology and Oncology | Admitting: Hematology and Oncology

## 2019-07-19 DIAGNOSIS — Z170 Estrogen receptor positive status [ER+]: Secondary | ICD-10-CM | POA: Diagnosis present

## 2019-07-19 DIAGNOSIS — C50212 Malignant neoplasm of upper-inner quadrant of left female breast: Secondary | ICD-10-CM | POA: Diagnosis present

## 2019-07-19 MED ORDER — SODIUM CHLORIDE (PF) 0.9 % IJ SOLN
INTRAMUSCULAR | Status: AC
  Filled 2019-07-19: qty 50

## 2019-07-19 MED ORDER — IOHEXOL 300 MG/ML  SOLN
75.0000 mL | Freq: Once | INTRAMUSCULAR | Status: AC | PRN
  Administered 2019-07-19: 75 mL via INTRAVENOUS

## 2019-07-22 NOTE — Progress Notes (Signed)
Patient Care Team: Default, Provider, MD as PCP - Cristopher Estimable, MD as Referring Physician (Emergency Medicine)  DIAGNOSIS:    ICD-10-CM   1. Malignant neoplasm of upper-inner quadrant of left breast in female, estrogen receptor negative ([place])  C50.212    Z17.1     SUMMARY OF ONCOLOGIC HISTORY: Oncology History  Breast cancer of upper-inner quadrant of left female breast ([place])  06/08/2012 Initial Diagnosis   invasive ductal carcinoma that was ER positive PR positive HER-2/neu positive measuring 3.1 cm by MRI criteria. Ki-67 was 70% HER-2 was amplified with a ratio 2.91   07/12/2012 - 07/17/2013 Neo-Adjuvant Chemotherapy   TCH 6 followed by Herceptin maintenance   12/11/2012 Surgery   Left breast lumpectomy: 1.8 cm tumor 1 positive sentinel node, axillary lymph node dissection 02/08/2013 showed 0/13 lymph nodes   03/25/2013 - 05/06/2013 Radiation Therapy   Adjuvant radiation therapy   06/05/2013 - 07/20/2017 Anti-estrogen oral therapy   Tamoxifen 20 mg daily   07/27/2017 Relapse/Recurrence   MRI Brain: 3.4 x 2.9 x 2.9 cm RIGHT frontal lobe mass with imaging characteristics of solitary metastasis. Extensive vasogenic edema resulting in 9 mm RIGHT to LEFT midline shift. Equivocal very early LEFT ventricle entrapment.    08/04/2017 Surgery   Rt frontal brain resection: Poorly differentiated tumor IHC suggests breast primary ER and PR Positive   08/25/2017 - 09/04/2017 Radiation Therapy   Stereotactic radiation   09/18/2017 -  Anti-estrogen oral therapy   Lapatinib with letrozole   12/18/2017 - 12/19/2017 Radiation Therapy   New right parietal lobe metastases status post [facility]   05/09/2019 Relapse/Recurrence   Interval increase in size of the enhancing nodular left internal mammary soft tissue 2.8 cm.  Redemonstrated enlarged supraclavicular, lower cervical and lower posterior cervical nodes unchanged.  Interval increase in the bony erosion of the posterior and lateral left  third rib, increasing soft tissue lesion eroding the left sternal body 3.1 cm was 2.5 cm.  Bronchiectatic changes   06/19/2019 -  Chemotherapy   The patient had ado-trastuzumab emtansine (KADCYLA) 180 mg in sodium chloride 0.9 % 250 mL chemo infusion, 3.6 mg/kg = 180 mg, Intravenous, Once, 2 of 6 cycles Administration: 180 mg (06/19/2019), 180 mg (07/10/2019)  for chemotherapy treatment.    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      CHIEF COMPLIANT: Follow-up of metastatic breast cancer to review scan  INTERVAL HISTORY: Melissa [name] is a 54 y.o. with above-mentioned history of metastatic breast cancer with brain metastasiss/presection who is currently [place] every 3 weeks. Chest CT on 07/19/19 showed interval development of increased density within the sternal lesion, stable left lower lobe lesion, and interval decreased in cervical adenopathy. She presents to the clinic todayfor to review her scan.   ALLERGIES:  is allergic to aspirin; protonix [pantoprazole]; and iodinated diagnostic agents.  MEDICATIONS:  Current Outpatient Medications  Medication Sig Dispense Refill  . albuterol (VENTOLIN HFA) 108 (90 BASE) MCG/ACT inhaler Inhale 2 puffs into the lungs every 6 (six) hours as needed. 1 Inhaler 3  . budesonide-formoterol (SYMBICORT) 160-4.5 MCG/ACT inhaler Inhale 2 puffs into the lungs 2 (two)  times daily. 3 Inhaler 3  . Chlorpheniramine-Codeine 2-10 MG/5ML LIQD Take 5 mLs by mouth every 6 (six) hours as needed. (Patient not taking: Reported on 05/01/2019) 200 mL 1  . HYDROcodone-guaiFENesin 2.5-200 MG/5ML SOLN Take 5 mLs  by mouth every 8 (eight) hours. (Patient not taking: Reported on 05/01/2019) 560 mL 0  . Isavuconazonium Sulfate (CRESEMBA) 186 MG CAPS Take 2 capsules (372 mg total) by mouth 3 (three) times daily. X 2 days. Followed by 2 capsules per day thereafter 60 capsule 6  . levalbuterol (XOPENEX) 0.63 MG/3ML nebulizer solution Take 3 mLs (0.63 mg total) by nebulization every 4 (four) hours as needed for wheezing or shortness of breath. 540 mL 6  . lidocaine-prilocaine (EMLA) cream Apply to affected area once 30 g 3  . Melatonin 5 MG TABS Take 5 mg by mouth at bedtime as needed.    . naproxen sodium (ALEVE) 220 MG tablet Take 440 mg by mouth 2 (two) times daily as needed (headache).     . oxyCODONE-acetaminophen (PERCOCET/ROXICET) 5-325 MG tablet Take 1 tablet by mouth every 6 (six) hours as needed for severe pain. (Patient taking differently: Take 1 tablet by mouth every 6 (six) hours as needed for severe pain. ) 60 tablet 0  . predniSONE (DELTASONE) 50 MG tablet Take for contrast allergy prior to study. Take one tablet 13 hours before study, one tablet 7 hours before study,and one tablet 1 hour before study (Patient not taking: Reported on 05/01/2019) 30 tablet 0  . VIRTUSSIN A/C 100-10 MG/5ML syrup TK 5 MLS PO EVERY 8 HOURS     No current facility-administered medications for this visit.    PHYSICAL EXAMINATION: ECOG PERFORMANCE STATUS: 1 - Symptomatic but completely ambulatory  Vitals:   07/23/19 0940  BP: 116/64  Pulse: 93  Resp: 17  Temp: 98.7 F (37.1 C)  SpO2: 100%   Filed Weights   07/23/19 0940  Weight: 114 lb (51.7 kg)    LABORATORY DATA:  I have reviewed the data as listed CMP Latest Ref Rng & Units 07/10/2019 06/19/2019 06/05/2019  Glucose 70 - 99 mg/dL 84 76 89  BUN 6 - 20 mg/dL '11 12 12  '$ Creatinine 0.44 - 1.00 mg/dL 0.87 0.86 0.98  Sodium 135 - 145 mmol/L 140 141 138  Potassium 3.5 - 5.1 mmol/L 4.3 4.0 4.1  Chloride 98 - 111 mmol/L 103 105 104  CO2 22 - 32 mmol/L '25 27 24   '$ Calcium 8.9 - 10.3 mg/dL 9.8 9.5 9.3  Total Protein 6.5 - 8.1 g/dL 7.9 7.5 7.4  Total Bilirubin 0.3 - 1.2 mg/dL <0.2(L) <0.2(L) 0.6  Alkaline Phos 38 - 126 U/L 164(H) 147(H) 118  AST 15 - 41 U/L '20 18 18  '$ ALT 0 - 44 U/L '12 12 12    '$ Lab Results  Component Value Date   WBC 11.3 (H) 07/10/2019   HGB 9.7 (L) 07/10/2019   HCT 30.6 (L) 07/10/2019   MCV 86.0 07/10/2019   PLT 475 (H) 07/10/2019   NEUTROABS 7.0 07/10/2019    ASSESSMENT & PLAN:  Breast cancer of upper-inner quadrant of left female breast ([facility]) 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 Anti Her 2 therapy with Lapatinibstarted 09/17/2017-05/14/2019: Stopped for progression 3.I discontinuedtamoxifen and started her on letrozole 2.5 mg daily.05/14/2019 stopped for progression 4.Stereotactic radiosurgery 12/19/2017 to the new right parietal lobe metastases. -------------------------------------------------------------------------------------------------------------------- Liver Biopsy 06/05/19: Metastatic cancer, ER/PR: 0%, Her 2: 3+ Positive, Ki 67: 20%  Current treatment: Kadcyla every 3 weeks, completed 2 cycles Chest wall pain: Pain medication Clinical response: Remarkable improvement in pain and discomfort.  She is no longer taking narcotic pain medication. 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  Kadcyla toxicities: 1.  Diarrhea: Mild 2.  Chronic mild peripheral neuropathy: Even  though it is unrelated to Kadcyla we are monitoring because Kadcyla can make neuropathy worse.  Left supraclavicular lymphadenopathy: Being monitored Return to clinic for cycle 3 of Kadcyla on 07/31/2019  No orders of the defined types were placed in this encounter.  The patient has a good understanding of the overall plan. she agrees with it. she will call with any problems that may develop before the next visit here.  Total time spent: 30 mins including face to face time and time spent for planning, charting and coordination of care  Nicholas Lose, MD 07/23/2019  I, Cloyde Reams Dorshimer, am acting as scribe for Dr. Nicholas Lose.  I have reviewed the above documentation for accuracy and completeness, and I agree with the above.

## 2019-07-23 ENCOUNTER — Telehealth: Payer: Self-pay | Admitting: Hematology and Oncology

## 2019-07-23 ENCOUNTER — Ambulatory Visit: Payer: Self-pay | Admitting: Urology

## 2019-07-23 ENCOUNTER — Other Ambulatory Visit: Payer: Self-pay

## 2019-07-23 ENCOUNTER — Inpatient Hospital Stay: Payer: PRIVATE HEALTH INSURANCE | Attending: Medical | Admitting: Hematology and Oncology

## 2019-07-23 DIAGNOSIS — G620 Drug-induced polyneuropathy: Secondary | ICD-10-CM | POA: Insufficient documentation

## 2019-07-23 DIAGNOSIS — C7931 Secondary malignant neoplasm of brain: Secondary | ICD-10-CM | POA: Diagnosis not present

## 2019-07-23 DIAGNOSIS — C50212 Malignant neoplasm of upper-inner quadrant of left female breast: Secondary | ICD-10-CM | POA: Insufficient documentation

## 2019-07-23 DIAGNOSIS — Z5112 Encounter for antineoplastic immunotherapy: Secondary | ICD-10-CM | POA: Insufficient documentation

## 2019-07-23 DIAGNOSIS — Z171 Estrogen receptor negative status [ER-]: Secondary | ICD-10-CM | POA: Diagnosis not present

## 2019-07-23 DIAGNOSIS — Z452 Encounter for adjustment and management of vascular access device: Secondary | ICD-10-CM | POA: Insufficient documentation

## 2019-07-23 DIAGNOSIS — R599 Enlarged lymph nodes, unspecified: Secondary | ICD-10-CM | POA: Diagnosis not present

## 2019-07-23 DIAGNOSIS — C787 Secondary malignant neoplasm of liver and intrahepatic bile duct: Secondary | ICD-10-CM | POA: Diagnosis not present

## 2019-07-23 NOTE — Telephone Encounter (Signed)
Scheduled per 1/12 los. Called and left msg. Mailing printout

## 2019-07-23 NOTE — Assessment & Plan Note (Signed)
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 Anti Her 2 therapy with Lapatinibstarted 09/17/2017-05/14/2019: Stopped for progression 3.I discontinuedtamoxifen and started her on letrozole 2.5 mg daily.05/14/2019 stopped for progression 4.Stereotactic radiosurgery 12/19/2017 to the new right parietal lobe metastases. -------------------------------------------------------------------------------------------------------------------- Liver Biopsy 06/05/19: Metastatic cancer, ER/PR: 0%, Her 2: 3+ Positive, Ki 67: 20%  Current treatment: Kadcyla every 3 weeks today is cycle 2 Chest wall pain: Pain medication  Kadcyla toxicities: 1.  Diarrhea: Mild 2.  Chronic mild peripheral neuropathy: Even though it is unrelated to Kadcyla we are monitoring because Kadcyla can make neuropathy worse.  Left supraclavicular lymphadenopathy: Being monitored Return to clinic every 3 weeks for Kadcyla.

## 2019-07-24 ENCOUNTER — Ambulatory Visit: Payer: Self-pay | Admitting: Urology

## 2019-07-25 ENCOUNTER — Other Ambulatory Visit: Payer: Self-pay | Admitting: Radiation Therapy

## 2019-07-25 DIAGNOSIS — C7931 Secondary malignant neoplasm of brain: Secondary | ICD-10-CM

## 2019-07-25 DIAGNOSIS — C7949 Secondary malignant neoplasm of other parts of nervous system: Secondary | ICD-10-CM

## 2019-07-25 NOTE — Progress Notes (Signed)
Ms. Rashid insurance has changed making [facility] an out of network facility for her. I have cancelled the original brain MRI order and will get a new scan set up at [facility] instead.   Mont Dutton R.T.(R)(T) Radiation Special Procedures Navigator

## 2019-07-30 ENCOUNTER — Telehealth: Payer: Self-pay | Admitting: Hematology and Oncology

## 2019-07-30 NOTE — Telephone Encounter (Signed)
R/s appt per 1/19 sch message - pt aware of new appt date and time

## 2019-07-31 ENCOUNTER — Inpatient Hospital Stay: Payer: PRIVATE HEALTH INSURANCE

## 2019-07-31 ENCOUNTER — Inpatient Hospital Stay: Payer: PRIVATE HEALTH INSURANCE | Admitting: Adult Health

## 2019-07-31 ENCOUNTER — Ambulatory Visit: Payer: Self-pay | Admitting: Hematology and Oncology

## 2019-07-31 ENCOUNTER — Other Ambulatory Visit: Payer: Self-pay

## 2019-07-31 ENCOUNTER — Ambulatory Visit: Payer: Self-pay

## 2019-08-02 ENCOUNTER — Ambulatory Visit (HOSPITAL_COMMUNITY)
Admission: RE | Admit: 2019-08-02 | Discharge: 2019-08-02 | Disposition: A | Discharge status: Home / Self Care | Payer: PRIVATE HEALTH INSURANCE | Source: Ambulatory Visit | Attending: Radiation Oncology | Admitting: Radiation Oncology

## 2019-08-02 ENCOUNTER — Other Ambulatory Visit: Payer: Self-pay

## 2019-08-02 DIAGNOSIS — C7931 Secondary malignant neoplasm of brain: Secondary | ICD-10-CM | POA: Diagnosis present

## 2019-08-02 DIAGNOSIS — C7949 Secondary malignant neoplasm of other parts of nervous system: Secondary | ICD-10-CM | POA: Diagnosis present

## 2019-08-02 MED ORDER — GADOBUTROL 1 MMOL/ML IV SOLN
5.5000 mL | Freq: Once | INTRAVENOUS | Status: AC | PRN
  Administered 2019-08-02: 14:00:00 5.5 mL via INTRAVENOUS

## 2019-08-02 NOTE — Progress Notes (Signed)
Cancel port access consult per MRI. Pt requests PIV since she did not apply EMLA.

## 2019-08-04 NOTE — Progress Notes (Signed)
Patient Care Team: Patient, [name] as PCP - General (General Practice) [name], MD as Referring Physician (Emergency Medicine)  DIAGNOSIS:    ICD-10-CM   1. Malignant neoplasm of upper-inner quadrant of left breast in female, estrogen receptor negative ([place])  C50.212    Z17.1     SUMMARY OF ONCOLOGIC HISTORY: Oncology History  Breast cancer of upper-inner quadrant of left female breast ([place])  06/08/2012 Initial Diagnosis   invasive ductal carcinoma that was ER positive PR positive HER-2/neu positive measuring 3.1 cm by MRI criteria. Ki-67 was 70% HER-2 was amplified with a ratio 2.91   07/12/2012 - 07/17/2013 Neo-Adjuvant Chemotherapy   TCH 6 followed by Herceptin maintenance   12/11/2012 Surgery   Left breast lumpectomy: 1.8 cm tumor 1 positive sentinel node, axillary lymph node dissection 02/08/2013 showed 0/13 lymph nodes   03/25/2013 - 05/06/2013 Radiation Therapy   Adjuvant radiation therapy   06/05/2013 - 07/20/2017 Anti-estrogen oral therapy   Tamoxifen 20 mg daily   07/27/2017 Relapse/Recurrence   MRI Brain: 3.4 x 2.9 x 2.9 cm RIGHT frontal lobe mass with imaging characteristics of solitary metastasis. Extensive vasogenic edema resulting in 9 mm RIGHT to LEFT midline shift. Equivocal very early LEFT ventricle entrapment.    08/04/2017 Surgery   Rt frontal brain resection: Poorly differentiated tumor IHC suggests breast primary ER and PR Positive   08/25/2017 - 09/04/2017 Radiation Therapy   Stereotactic radiation   09/18/2017 -  Anti-estrogen oral therapy   Lapatinib with letrozole   12/18/2017 - 12/19/2017 Radiation Therapy   New right parietal lobe metastases status post [facility]   05/09/2019 Relapse/Recurrence   Interval increase in size of the enhancing nodular left internal mammary soft tissue 2.8 cm.  Redemonstrated enlarged supraclavicular, lower cervical and lower posterior cervical nodes unchanged.  Interval increase in the bony erosion of the posterior  and lateral left third rib, increasing soft tissue lesion eroding the left sternal body 3.1 cm was 2.5 cm.  Bronchiectatic changes   06/19/2019 -  Chemotherapy   The patient had ado-trastuzumab emtansine (KADCYLA) 180 mg in sodium chloride 0.9 % 250 mL chemo infusion, 3.6 mg/kg = 180 mg, Intravenous, Once, 2 of 6 cycles Administration: 180 mg (06/19/2019), 180 mg (07/10/2019)  for chemotherapy treatment.    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[facility] [address])  06/10/2019 Initial Diagnosis   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[facility])   06/19/2019 -  Chemotherapy   The patient had ado-trastuzumab emtansine (KADCYLA) 180 mg in sodium chloride 0.9 % 250 mL chemo infusion, 3.6 mg/kg = 180 mg, Intravenous, Once, 2 of 6 cycles Administration: 180 mg (06/19/2019), 180 mg (07/10/2019)  for chemotherapy treatment.      CHIEF COMPLIANT: Follow-up of metastatic breast cancer  INTERVAL HISTORY: Melissa [name] is a 54 y.o. with above-mentioned history of metastatic breast cancer with brain metastasiss/presection who is currently [place] every 3 weeks. Brain MRI on 08/02/19 showed no new or progressive findings. She presents to the clinic todayfor treatment.  She is tolerating Kadcyla extremely well.  Her treatment had to be postponed briefly for insurance reasons.  She denies any nausea or vomiting.  Energy levels are excellent.  She is not requiring any pain medications.  ALLERGIES:  is allergic to aspirin; protonix [pantoprazole]; and iodinated diagnostic agents.  MEDICATIONS:  Current Outpatient Medications  Medication Sig Dispense Refill  . albuterol (VENTOLIN HFA) 108 (90 BASE) MCG/ACT inhaler Inhale 2 puffs into the lungs every 6 (six) hours as  needed. 1 Inhaler 3  . budesonide-formoterol (SYMBICORT) 160-4.5 MCG/ACT inhaler Inhale 2 puffs into the lungs 2 (two) times daily. 3 Inhaler 3  . Isavuconazonium Sulfate (CRESEMBA) 186 MG CAPS Take 2 capsules (372 mg total) by mouth 3 (three) times  daily. X 2 days. Followed by 2 capsules per day thereafter 60 capsule 6  . levalbuterol (XOPENEX) 0.63 MG/3ML nebulizer solution Take 3 mLs (0.63 mg total) by nebulization every 4 (four) hours as needed for wheezing or shortness of breath. 540 mL 6  . lidocaine-prilocaine (EMLA) cream Apply to affected area once 30 g 3  . Melatonin 5 MG TABS Take 5 mg by mouth at bedtime as needed.    . naproxen sodium (ALEVE) 220 MG tablet Take 440 mg by mouth 2 (two) times daily as needed (headache).     . oxyCODONE-acetaminophen (PERCOCET/ROXICET) 5-325 MG tablet Take 1 tablet by mouth every 6 (six) hours as needed for severe pain. (Patient taking differently: Take 1 tablet by mouth every 6 (six) hours as needed for severe pain. ) 60 tablet 0  . predniSONE (DELTASONE) 50 MG tablet Take for contrast allergy prior to study. Take one tablet 13 hours before study, one tablet 7 hours before study,and one tablet 1 hour before study (Patient not taking: Reported on 05/01/2019) 30 tablet 0  . VIRTUSSIN A/C 100-10 MG/5ML syrup TK 5 MLS PO EVERY 8 HOURS     No current facility-administered medications for this visit.    PHYSICAL EXAMINATION: ECOG PERFORMANCE STATUS: 1 - Symptomatic but completely ambulatory  Vitals:   08/05/19 0813  BP: 121/61  Pulse: 80  Resp: 18  Temp: 99.1 F (37.3 C)  SpO2: 100%   Filed Weights   08/05/19 0813  Weight: 116 lb 12.8 oz (53 kg)    LABORATORY DATA:  I have reviewed the data as listed CMP Latest Ref Rng & Units 07/10/2019 06/19/2019 06/05/2019  Glucose 70 - 99 mg/dL 84 76 89  BUN 6 - 20 mg/dL '11 12 12  '$ Creatinine 0.44 - 1.00 mg/dL 0.87 0.86 0.98  Sodium 135 - 145 mmol/L 140 141 138  Potassium 3.5 - 5.1 mmol/L 4.3 4.0 4.1  Chloride 98 - 111 mmol/L 103 105 104  CO2 22 - 32 mmol/L '25 27 24  '$ Calcium 8.9 - 10.3 mg/dL 9.8 9.5 9.3  Total Protein 6.5 - 8.1 g/dL 7.9 7.5 7.4  Total Bilirubin 0.3 - 1.2 mg/dL <0.2(L) <0.2(L) 0.6  Alkaline Phos 38 - 126 U/L 164(H) 147(H) 118   AST 15 - 41 U/L '20 18 18  '$ ALT 0 - 44 U/L '12 12 12    '$ Lab Results  Component Value Date   WBC 10.3 08/05/2019   HGB 9.7 (L) 08/05/2019   HCT 30.8 (L) 08/05/2019   MCV 88.3 08/05/2019   PLT 435 (H) 08/05/2019   NEUTROABS 6.4 08/05/2019    ASSESSMENT & PLAN:  Breast cancer of upper-inner quadrant of left female breast ([place]) 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 Anti Her 2 therapy with Lapatinibstarted 09/17/2017-05/14/2019: Stopped for progression 3.I discontinuedtamoxifen and started her on letrozole 2.5 mg daily.05/14/2019 stopped for progression 4.Stereotactic radiosurgery 12/19/2017 to  the new right parietal lobe metastases. -------------------------------------------------------------------------------------------------------------------- Liver Biopsy 06/05/19: Metastatic cancer, ER/PR: 0%, Her 2: 3+ Positive, Ki 67: 20%  Current treatment: Kadcyla every 3 weeks, today is cycle 3 Chest wall pain: Marked improvement Clinical response: Remarkable improvement in pain and discomfort.  She is no longer taking narcotic pain medication. 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  Kadcyla toxicities: 1.  Diarrhea: Well controlled 2.  Chronic mild peripheral neuropathy: Stable 3.  Normocytic anemia: Probably due to treatment.  Prior to all of the treatment start she was normal on the hemoglobin.  Left supraclavicular lymphadenopathy:  Monitoring Sternal pain: Resolved Return to clinic in 3 weeks for cycle 4.    No orders of the defined types were placed in this encounter.  The patient has a good understanding of the overall plan. she agrees with it. she will call with any problems that may develop before the next visit here.  Total time spent: 30 mins including face to face time and time spent for planning, charting and coordination of care  Nicholas Lose, MD 08/05/2019  I, Cloyde Reams Dorshimer, am acting as scribe for Dr. Nicholas Lose.  I have reviewed the above documentation for accuracy and completeness, and I agree with the above.

## 2019-08-05 ENCOUNTER — Other Ambulatory Visit: Payer: Self-pay | Admitting: Hematology and Oncology

## 2019-08-05 ENCOUNTER — Other Ambulatory Visit: Payer: Self-pay

## 2019-08-05 ENCOUNTER — Inpatient Hospital Stay (HOSPITAL_BASED_OUTPATIENT_CLINIC_OR_DEPARTMENT_OTHER): Payer: PRIVATE HEALTH INSURANCE | Admitting: Hematology and Oncology

## 2019-08-05 ENCOUNTER — Inpatient Hospital Stay: Payer: PRIVATE HEALTH INSURANCE

## 2019-08-05 VITALS — BP 113/61 | HR 76 | Resp 16

## 2019-08-05 DIAGNOSIS — Z5112 Encounter for antineoplastic immunotherapy: Secondary | ICD-10-CM | POA: Diagnosis present

## 2019-08-05 DIAGNOSIS — R599 Enlarged lymph nodes, unspecified: Secondary | ICD-10-CM | POA: Diagnosis not present

## 2019-08-05 DIAGNOSIS — Z95828 Presence of other vascular implants and grafts: Secondary | ICD-10-CM | POA: Insufficient documentation

## 2019-08-05 DIAGNOSIS — C50212 Malignant neoplasm of upper-inner quadrant of left female breast: Secondary | ICD-10-CM

## 2019-08-05 DIAGNOSIS — G620 Drug-induced polyneuropathy: Secondary | ICD-10-CM | POA: Diagnosis not present

## 2019-08-05 DIAGNOSIS — Z171 Estrogen receptor negative status [ER-]: Secondary | ICD-10-CM | POA: Diagnosis not present

## 2019-08-05 DIAGNOSIS — C50912 Malignant neoplasm of unspecified site of left female breast: Secondary | ICD-10-CM

## 2019-08-05 DIAGNOSIS — C787 Secondary malignant neoplasm of liver and intrahepatic bile duct: Secondary | ICD-10-CM | POA: Diagnosis not present

## 2019-08-05 DIAGNOSIS — Z452 Encounter for adjustment and management of vascular access device: Secondary | ICD-10-CM | POA: Diagnosis not present

## 2019-08-05 DIAGNOSIS — C7931 Secondary malignant neoplasm of brain: Secondary | ICD-10-CM

## 2019-08-05 LAB — COMPREHENSIVE METABOLIC PANEL
ALT: 11 U/L (ref 0–44)
AST: 18 U/L (ref 15–41)
Albumin: 3.6 g/dL (ref 3.5–5.0)
Alkaline Phosphatase: 108 U/L (ref 38–126)
Anion gap: 10 (ref 5–15)
BUN: 12 mg/dL (ref 6–20)
CO2: 25 mmol/L (ref 22–32)
Calcium: 9.7 mg/dL (ref 8.9–10.3)
Chloride: 107 mmol/L (ref 98–111)
Creatinine, Ser: 0.91 mg/dL (ref 0.44–1.00)
GFR calc Af Amer: >60 mL/min (ref >60)
GFR calc non Af Amer: >60 mL/min (ref >60)
Glucose, Bld: 80 mg/dL (ref 70–99)
Potassium: 4.3 mmol/L (ref 3.5–5.1)
Sodium: 142 mmol/L (ref 135–145)
Total Bilirubin: <0.2 mg/dL — ABNORMAL LOW (ref 0.3–1.2)
Total Protein: 7.7 g/dL (ref 6.5–8.1)

## 2019-08-05 LAB — CBC WITH DIFFERENTIAL/PLATELET
Abs Immature Granulocytes: 0.02 10*3/uL (ref 0.00–0.07)
Basophils Absolute: 0.1 10*3/uL (ref 0.0–0.1)
Basophils Relative: 1 %
Eosinophils Absolute: 0.9 10*3/uL — ABNORMAL HIGH (ref 0.0–0.5)
Eosinophils Relative: 8 %
HCT: 30.8 % — ABNORMAL LOW (ref 36.0–46.0)
Hemoglobin: 9.7 g/dL — ABNORMAL LOW (ref 12.0–15.0)
Immature Granulocytes: 0 %
Lymphocytes Relative: 20 %
Lymphs Abs: 2.1 10*3/uL (ref 0.7–4.0)
MCH: 27.8 pg (ref 26.0–34.0)
MCHC: 31.5 g/dL (ref 30.0–36.0)
MCV: 88.3 fL (ref 80.0–100.0)
Monocytes Absolute: 0.9 10*3/uL (ref 0.1–1.0)
Monocytes Relative: 8 %
Neutro Abs: 6.4 10*3/uL (ref 1.7–7.7)
Neutrophils Relative %: 63 %
Platelets: 435 10*3/uL — ABNORMAL HIGH (ref 150–400)
RBC: 3.49 MIL/uL — ABNORMAL LOW (ref 3.87–5.11)
RDW: 19.6 % — ABNORMAL HIGH (ref 11.5–15.5)
WBC: 10.3 10*3/uL (ref 4.0–10.5)
nRBC: 0 % (ref 0.0–0.2)

## 2019-08-05 MED ORDER — ALTEPLASE 2 MG IJ SOLR
INTRAMUSCULAR | Status: AC
  Filled 2019-08-05: qty 2

## 2019-08-05 MED ORDER — SODIUM CHLORIDE 0.9 % IV SOLN
Freq: Once | INTRAVENOUS | Status: AC
  Filled 2019-08-05: qty 250

## 2019-08-05 MED ORDER — SODIUM CHLORIDE 0.9% FLUSH
10.0000 mL | INTRAVENOUS | Status: DC | PRN
  Administered 2019-08-05: 10 mL
  Filled 2019-08-05: qty 10

## 2019-08-05 MED ORDER — ACETAMINOPHEN 325 MG PO TABS
650.0000 mg | ORAL_TABLET | Freq: Once | ORAL | Status: AC
  Administered 2019-08-05: 650 mg via ORAL

## 2019-08-05 MED ORDER — ALTEPLASE 2 MG IJ SOLR
2.0000 mg | Freq: Once | INTRAMUSCULAR | Status: AC | PRN
  Administered 2019-08-05: 2 mg
  Filled 2019-08-05: qty 2

## 2019-08-05 MED ORDER — DIPHENHYDRAMINE HCL 25 MG PO CAPS
ORAL_CAPSULE | ORAL | Status: AC
  Filled 2019-08-05: qty 1

## 2019-08-05 MED ORDER — ACETAMINOPHEN 325 MG PO TABS
ORAL_TABLET | ORAL | Status: AC
  Filled 2019-08-05: qty 2

## 2019-08-05 MED ORDER — SODIUM CHLORIDE 0.9 % IV SOLN
3.6000 mg/kg | Freq: Once | INTRAVENOUS | Status: AC
  Administered 2019-08-05: 180 mg via INTRAVENOUS
  Filled 2019-08-05: qty 9

## 2019-08-05 MED ORDER — DIPHENHYDRAMINE HCL 25 MG PO CAPS
25.0000 mg | ORAL_CAPSULE | Freq: Once | ORAL | Status: AC
  Administered 2019-08-05: 25 mg via ORAL

## 2019-08-05 MED ORDER — HEPARIN SOD (PORK) LOCK FLUSH 100 UNIT/ML IV SOLN
500.0000 [IU] | Freq: Once | INTRAVENOUS | Status: AC | PRN
  Administered 2019-08-05: 500 [IU]
  Filled 2019-08-05: qty 5

## 2019-08-05 NOTE — Patient Instructions (Signed)
[facility] Discharge Instructions for Patients Receiving Chemotherapy  Today you received the following chemotherapy agents :  Kadcyla.  To help prevent nausea and vomiting after your treatment, we encourage you to take your nausea medication as prescribed.   If you develop nausea and vomiting that is not controlled by your nausea medication, call the clinic.   BELOW ARE SYMPTOMS THAT SHOULD BE REPORTED IMMEDIATELY:  *FEVER GREATER THAN 100.5 F  *CHILLS WITH OR WITHOUT FEVER  NAUSEA AND VOMITING THAT IS NOT CONTROLLED WITH YOUR NAUSEA MEDICATION  *UNUSUAL SHORTNESS OF BREATH  *UNUSUAL BRUISING OR BLEEDING  TENDERNESS IN MOUTH AND THROAT WITH OR WITHOUT PRESENCE OF ULCERS  *URINARY PROBLEMS  *BOWEL PROBLEMS  UNUSUAL RASH Items with * indicate a potential emergency and should be followed up as soon as possible.  Feel free to call the clinic should you have any questions or concerns. The clinic phone number is [phone].  Please show the CHEMO ALERT CARD at check-in to the Emergency Department and triage nurse.   

## 2019-08-05 NOTE — Assessment & Plan Note (Signed)
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 Anti Her 2 therapy with Lapatinibstarted 09/17/2017-05/14/2019: Stopped for progression 3.I discontinuedtamoxifen and started her on letrozole 2.5 mg daily.05/14/2019 stopped for progression 4.Stereotactic radiosurgery 12/19/2017 to the new right parietal lobe metastases. -------------------------------------------------------------------------------------------------------------------- Liver Biopsy 06/05/19: Metastatic cancer, ER/PR: 0%, Her 2: 3+ Positive, Ki 67: 20%  Current treatment: Kadcyla every 3 weeks, today is cycle 3 Chest wall pain: Marked improvement Clinical response: Remarkable improvement in pain and discomfort.  She is no longer taking narcotic pain medication. 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  Kadcyla toxicities: 1.  Diarrhea: Well controlled 2.  Chronic mild peripheral neuropathy: Stable  Left supraclavicular lymphadenopathy: Monitoring Return to clinic in 3 weeks for cycle 4.

## 2019-08-07 ENCOUNTER — Other Ambulatory Visit: Payer: Self-pay

## 2019-08-07 ENCOUNTER — Encounter: Payer: Self-pay | Admitting: Urology

## 2019-08-07 ENCOUNTER — Inpatient Hospital Stay: Payer: PRIVATE HEALTH INSURANCE

## 2019-08-07 ENCOUNTER — Ambulatory Visit
Admission: RE | Admit: 2019-08-07 | Discharge: 2019-08-07 | Disposition: A | Discharge status: Home / Self Care | Payer: PRIVATE HEALTH INSURANCE | Source: Ambulatory Visit | Attending: Urology | Admitting: Urology

## 2019-08-07 DIAGNOSIS — C7931 Secondary malignant neoplasm of brain: Secondary | ICD-10-CM

## 2019-08-07 NOTE — Progress Notes (Signed)
Radiation Oncology         [phone]) [phone] ________________________________  Name: Melissa [name] MRN: [id number]  Date: 08/07/2019  DOB: January 08, 1966  Post Treatment Note  CC: Patient, [name]  [name], [name], *  Diagnosis:   54 y.o. 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  Interval Since Last Radiation:  4 months 03/25/2019:  SRS brain//PTV 5:  Right frontal 5 mm target was treated to a prescription dose of 20 Gy in a single fraction.   09/04/2018:   SRS Brain// Right Frontal, 2 targets / 20 Gy in 1 fraction PTV3: Ant Rt Frontal 6m 20Gy PTV4: Rt Frontal resection cavity 530m 20Gy  12/18/2017: SRS brain//PTV2: 4 mm Rt Parietal lesion treated to 20 Gy in 1 Fx  08/25/2017, 08/28/2017, 08/30/2017, 09/01/2017, 09/04/2017: PTV1: post op SRS to right frontal lobe resection cavity in 5 fxs  Narrative:  I spoke with the patient to conduct her routine scheduled 3 month posttreatment follow up visit via telephone to spare the patient unnecessary potential exposure in the healthcare setting during the current COVID-19 pandemic.  The patient was notified in advance and gave permission to proceed with this visit format.  In summary, she initially presented to the emergency Department on 07/27/2017 with complaints of headaches ongoing for approximately 5 weeks with associated nausea and vomiting. She was initially treated by her PCP for sinusitis without improvement.  She also has a history of migraine headaches and [facility] treatment of what she thought was a typical migraine headache given the fact that she had her usual blurry vision and aura preceding the headache. She reports that the headache did improve with treatment but the relief was short-lived as the headache returned the very next day. She followed up with her primary care  physician and a CT of the head was going to be scheduled due to the persistent headaches but in the interim, [facility] to increased severity of the headache with associated nausea and vomiting. The headaches were occuring in the frontal lobe as well as bilateral temporal with occasional radiation to the occipital lobe and associated with blurry vision, decreased appetite, fatigue, nausea and vomiting.  She denied any difficulty with speech, memory, imbalance or focal weakness.  CT Head on admission 07/27/2017 showed a large right frontal lobe mass measuring 3.1 by 2.5 cm with significant surrounding edema with mass effect and right to left midline shift measuring 8 mm. A subsequent Brain MRI showed a 3.4 x 2.9 x 2.9 cm right frontal lobe mass with imaging characteristics of solitary metastasis and extensive vasogenic edema resulting in 9 mm right to left midline shift as well as equivocal very early left ventricle entrapment.   CT Chest, Abdomen, Pelvis on 07/28/2017 for disease staging showed no findings to suggest metastatic breast cancer involving the chest, abdomen, pelvis or osseous structures. 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 Surgical changes involving the left breast and left axilla but no findings for local recurrence or regional adenopathy.  She proceeded with a gross total resection of the frontal lesion on 08/05/17 with Dr. DiCyndy Freezeollowed by adjuvant fractionated postop SRS radiotherapy to the resection cavity in February 2019. Final surgical pathology revealed poorly differentiated adenocarcinoma consistent with metastatic  breast cancer, ER/PR positive and HER-2 positive.   She tolerated SRS treatment well and initial post treatment MRI brain on 12/07/17 showed satisfactory appearance of the right anterior frontal lobe post treatment site with expected evolution but unfortunately also showed a  single, new 2 - 3 mm lesion in the right parietal lobe without associated edema or mass-effect.  She elected to undergo salvage SRS treatment to the new lesion in the parietal lobe which was completed on 12/18/17 and tolerated well.  A follow up brain MRI on 05/18/18 showed a stable right frontal resection cavity with stable 4 mm nodular enhancement at the inferior cavity margin but no evidence of new disease.  Unfortunately, the follow up MRI brain scan on 08/23/2018 showed a new 11 mm inferior right frontal lesion with only mild associated edema and no mass-effect.  She reported that she had been having more frequent frontal headaches which were not severe and responded to Aleve and otherwise, had been feeling well.  She elected to proceed with a single fraction of SRS to this new brain metastasis as recommended and this was completed on  09/04/18 and tolerated very well.   She had bronchoscopy with Dr. Valeta Harms in 05/2018 and dx'ed with Aspergillus- ID following (Dr. Graylon Good).  She also continues in routine follow up with Dr. Melvyn Novas in Pulmonology.  Follow up MRI brain scan from 03/08/19 showed continued interval enlargement of the nodular focus with enhancement at the superior margin of the right frontal resection cavity, measuring 5 x 6 mm in diameter compared with 3 x 4.5 mm on the previous study. Two smaller foci of enhancement along the inferior margin were unchanged and there was no evidence of increasing mass effect or edema and no new lesions seen elsewhere within the brain.  She elected to proceed with SRS treatment of the progressive nodular lesion which was completed on 03/25/19 and tolerated well without any acute ill effects.    Interval History:   She has remained clinically stable since the time of her last SRS treatment and is currently without complaints. Unfortunately, since we last saw her she has developed systemic disease progression, particularly in the chest with increase in size of enhancing  nodular left internal mammary soft tissue mass, lymph nodes and left third rib lesion and soft tissue lesion eroding the left sternal body as well as a new, hypermetabolic 6.5 cm liver lesion seen on PET scan from 05/23/2019 despite continuing onlapatinibwith Letrozole. She did have an ultrasound-guided biopsy of the liver mass on 06/05/2019 which confirmed metastatic breast cancer, HER-2 positive, ER/PR negative. Her systemic therapy was recently changed to [place] beginning on 06/19/2019, under the care and direction of Dr. Lindi Adie and she continues to tolerate this very well.  Recent follow-up CT chest imaging from 07/19/2019 does show a positive response to treatment with decrease in size of the soft tissue mass in the breast as well as bony lesions and adenopathy.  Fortunately, her recent follow-up MRI brain from 08/02/2019 shows stability of previously treated disease and no new or progressive findings.  On review of systems, the patient states that she is doing well overall.  She is without complaints aside from mild residual fatigue and chronic cough.  She has continued with improved energy levels and has been able to continue to work. She denies headaches, decreased visual or auditory acuity, tinnitus, tremor, focal weakness or seizure activity. She is not having difficulty with speech or word finding.  She denies abdominal pain, N/V or  diarrhea.  She was recently switched to [facility] systemic therapy and is tolerating this well. She reports a healthy appetite and is steadily gaining her weight back which she is pleased with.  ALLERGIES:  is allergic to aspirin; protonix [pantoprazole]; and iodinated diagnostic agents.  Meds: Current Outpatient Medications  Medication Sig Dispense Refill  . albuterol (VENTOLIN HFA) 108 (90 BASE) MCG/ACT inhaler Inhale 2 puffs into the lungs every 6 (six) hours as needed. 1 Inhaler 3  . budesonide-formoterol (SYMBICORT) 160-4.5 MCG/ACT inhaler Inhale 2 puffs into the  lungs 2 (two) times daily. 3 Inhaler 3  . Isavuconazonium Sulfate (CRESEMBA) 186 MG CAPS Take 2 capsules (372 mg total) by mouth 3 (three) times daily. X 2 days. Followed by 2 capsules per day thereafter 60 capsule 6  . levalbuterol (XOPENEX) 0.63 MG/3ML nebulizer solution Take 3 mLs (0.63 mg total) by nebulization every 4 (four) hours as needed for wheezing or shortness of breath. 540 mL 6  . lidocaine-prilocaine (EMLA) cream Apply to affected area once 30 g 3  . Melatonin 5 MG TABS Take 5 mg by mouth at bedtime as needed.    . naproxen sodium (ALEVE) 220 MG tablet Take 440 mg by mouth 2 (two) times daily as needed (headache).     . predniSONE (DELTASONE) 50 MG tablet Take for contrast allergy prior to study. Take one tablet 13 hours before study, one tablet 7 hours before study,and one tablet 1 hour before study 30 tablet 0  . VIRTUSSIN A/C 100-10 MG/5ML syrup TK 5 MLS PO EVERY 8 HOURS    . oxyCODONE-acetaminophen (PERCOCET/ROXICET) 5-325 MG tablet Take 1 tablet by mouth every 6 (six) hours as needed for severe pain. (Patient not taking: Reported on 08/07/2019) 60 tablet 0   No current facility-administered medications for this encounter.    Physical Findings:  vitals were not taken for this visit.   /Unable to assess due to virtual/telephone visit format.  Lab Findings: Lab Results  Component Value Date   WBC 10.3 08/05/2019   HGB 9.7 (L) 08/05/2019   HCT 30.8 (L) 08/05/2019   MCV 88.3 08/05/2019   PLT 435 (H) 08/05/2019     Radiographic Findings: CT Chest W Contrast  Result Date: 07/19/2019 CLINICAL DATA:  Patient with history of breast cancer diagnosed in 2014. EXAM: CT CHEST WITH CONTRAST TECHNIQUE: Multidetector CT imaging of the chest was performed during intravenous contrast administration. CONTRAST:  51m OMNIPAQUE IOHEXOL 300 MG/ML  SOLN COMPARISON:  Chest CT 05/09/2019; PET-CT 05/23/2019 FINDINGS: Cardiovascular: Right anterior chest wall Port-A-Cath is present with tip  terminating in the superior vena cava. Normal heart size. Trace fluid superior pericardial recess. Aorta and main pulmonary artery normal in caliber. Mediastinum/Nodes: Stable postsurgical changes left axilla. Interval decrease in size of prevascular adenopathy measuring 0.7 cm (image 42; series 2), previously 1.4 cm. Interval decrease in size of posterior cervical node measuring 0.7 cm (image 12; series 2), previously 0.8 cm. Interval decrease in size of adjacent posterior cervical node measuring 0.6 cm (image 10; series 2), previously 0.9 cm. Lungs/Pleura: Central airways are patent. Stable postsurgical changes compatible with left upper lobectomy. Redemonstrated bronchiectasis particularly involving the left lower lung with associated cavitary changes of the superior left lower lobe. Interval improvement in previously described patchy nodularity within the medial mid left lower lobe (image 67; series 5). Grossly similar appearing large cavitary lesion within the superior aspect of the left lower lobe measuring up to 6.6 x 2.6 cm (image 41; series 5) with associated  surrounding nodularity. Biapical pleuroparenchymal thickening and scarring. No pleural effusion or pneumothorax. Upper Abdomen: Unremarkable Musculoskeletal: Overall there appears to be decreased erosion of the posterior left third rib (image 30; series). Increased density involving the lytic lesion the sternum which may represent enhancing soft tissue or potentially interval healing (image 61; series 2) measuring 1.0 cm (image 60; series 2). The adjacent internal mammary soft tissue has slightly decreased measuring 1.3 cm (image 63; series 2), previously 1.9 cm. Postsurgical changes within the left breast. IMPRESSION: 1. Interval development of increased density material within sternal lesion which may represent interval healing or potentially enhancing soft tissue. The immediate adjacent soft tissue surrounding the left internal mammary artery  appears slightly decreased when compared to prior exam. 2. Interval decreased erosion of the third rib. 3. Similar-appearing large cavitary lesion within the superior aspect of the left lower lobe with peripheral surrounding nodularity which was favored to be infectious/inflammatory in etiology on prior PET-CT. Recommend continued attention on follow-up. 4. Interval decrease in size prevascular and left posterior cervical adenopathy. 5. Interval improvement of previously described nodularity within the left mid lung favored to be a improving infectious/inflammatory etiology. 6. Emphysema (ICD10-J43.9). Electronically Signed   By: Lovey Newcomer M.D.   On: 07/19/2019 09:40   MR Brain W Wo Contrast  Result Date: 08/02/2019 CLINICAL DATA:  Metastatic breast cancer.  S RS follow-up. EXAM: MRI HEAD WITHOUT AND WITH CONTRAST TECHNIQUE: Multiplanar, multiecho pulse sequences of the brain and surrounding structures were obtained without and with intravenous contrast. CONTRAST:  5.72m GADAVIST GADOBUTROL 1 MMOL/ML IV SOLN COMPARISON:  03/08/2019 FINDINGS: Brain: No progressive or worrisome finding. Previous right frontal craniotomy. 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 Foci of enhancement seen previously are either stable or improved. At the medial inferior region, there is a stable 3 mm focus of nodular enhancement. Otherwise, enhancement along the posterior margin of the resection is becoming less prominent. Regional gliotic signal appears stable. 3-4 mm focus of enhancement at the inferior right frontal lobe is stable. This was initially seen as an 11 mm lesion on 08/23/2018 and treated subsequently. Remainder of the brain remains negative. No second lesion is seen. Few scattered punctate foci of T2 and FLAIR signal remain evident within the white matter, not progressive. No hydrocephalus. No extra-axial collection. Vascular: Major vessels at the base of the brain show flow. Skull and upper cervical spine: Negative  Sinuses/Orbits: Clear/normal Other: None IMPRESSION: No new or progressive finding. Previous surgery and radiation to a right frontal lesion. Small foci of enhancement in that region are either stable or becoming less prominent. Stable 4 mm focus of enhancement at the anterior inferior frontal lobe without evidence of worsening. Electronically Signed   By: MNelson ChimesM.D.   On: 08/02/2019 14:59    Impression/Plan: 1. 54y.o. 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 She appears to have [facility] well [facility] and is currently without CNS complaints.  We discussed the plan to proceed with serial brain MRI scans every 3 months with follow up thereafter to review results and recommendations from the brain tumor board.  She will also continue in routine follow up with Dr. GLindi Adiefor continued management of her systemic disease.  The current plan is to repeat PET imaging for restaging after completion of 4-6 cycles of Kadcycla. She appears to have a good understanding of her disease and our recommendations and is  comfortable and in agreement with the current plan as stated.  Given current concerns for patient exposure during the COVID-19 pandemic, this encounter was conducted via telephone. The patient was notified in advance and was offered a [place] meeting to allow for face to face communication but unfortunately reported that she did not have the appropriate resources/technology to support such a visit and instead preferred to proceed with telephone follow up visit. The patient has given verbal consent for this type of encounter. The time spent during this encounter was 15 minutes with 50% of time spent in coordination of care. The attendants for this meeting include Nezar Buckles PA-C, and patient, Melissa [name]. During the encounter, Ivadell Gaul PA-C was located at [facility]  Radiation Oncology Department.  Patient, Melissa [name] was located at home.    Nicholos Johns, PA-C

## 2019-08-21 ENCOUNTER — Other Ambulatory Visit: Payer: PRIVATE HEALTH INSURANCE

## 2019-08-21 ENCOUNTER — Ambulatory Visit: Payer: PRIVATE HEALTH INSURANCE

## 2019-08-21 ENCOUNTER — Ambulatory Visit: Payer: PRIVATE HEALTH INSURANCE | Admitting: Adult Health

## 2019-08-26 NOTE — Progress Notes (Signed)
Patient Care Team: Patient, [name] as PCP - General (General Practice) [name], MD as Referring Physician (Emergency Medicine)  DIAGNOSIS:    ICD-10-CM   1. Malignant neoplasm of upper-inner quadrant of left breast in female, estrogen receptor negative ([place])  C50.212    Z17.1     SUMMARY OF ONCOLOGIC HISTORY: Oncology History  Breast cancer of upper-inner quadrant of left female breast ([place])  06/08/2012 Initial Diagnosis   invasive ductal carcinoma that was ER positive PR positive HER-2/neu positive measuring 3.1 cm by MRI criteria. Ki-67 was 70% HER-2 was amplified with a ratio 2.91   07/12/2012 - 07/17/2013 Neo-Adjuvant Chemotherapy   TCH 6 followed by Herceptin maintenance   12/11/2012 Surgery   Left breast lumpectomy: 1.8 cm tumor 1 positive sentinel node, axillary lymph node dissection 02/08/2013 showed 0/13 lymph nodes   03/25/2013 - 05/06/2013 Radiation Therapy   Adjuvant radiation therapy   06/05/2013 - 07/20/2017 Anti-estrogen oral therapy   Tamoxifen 20 mg daily   07/27/2017 Relapse/Recurrence   MRI Brain: 3.4 x 2.9 x 2.9 cm RIGHT frontal lobe mass with imaging characteristics of solitary metastasis. Extensive vasogenic edema resulting in 9 mm RIGHT to LEFT midline shift. Equivocal very early LEFT ventricle entrapment.    08/04/2017 Surgery   Rt frontal brain resection: Poorly differentiated tumor IHC suggests breast primary ER and PR Positive   08/25/2017 - 09/04/2017 Radiation Therapy   Stereotactic radiation   09/18/2017 -  Anti-estrogen oral therapy   Lapatinib with letrozole   12/18/2017 - 12/19/2017 Radiation Therapy   New right parietal lobe metastases status post [facility]   05/09/2019 Relapse/Recurrence   Interval increase in size of the enhancing nodular left internal mammary soft tissue 2.8 cm.  Redemonstrated enlarged supraclavicular, lower cervical and lower posterior cervical nodes unchanged.  Interval increase in the bony erosion of the posterior  and lateral left third rib, increasing soft tissue lesion eroding the left sternal body 3.1 cm was 2.5 cm.  Bronchiectatic changes   06/19/2019 -  Chemotherapy   The patient had ado-trastuzumab emtansine (KADCYLA) 180 mg in sodium chloride 0.9 % 250 mL chemo infusion, 3.6 mg/kg = 180 mg, Intravenous, Once, 3 of 13 cycles Administration: 180 mg (06/19/2019), 180 mg (07/10/2019), 180 mg (08/05/2019)  for chemotherapy treatment.    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[facility])  06/10/2019 Initial Diagnosis   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[facility])   06/19/2019 -  Chemotherapy   The patient had ado-trastuzumab emtansine (KADCYLA) 180 mg in sodium chloride 0.9 % 250 mL chemo infusion, 3.6 mg/kg = 180 mg, Intravenous, Once, 3 of 13 cycles Administration: 180 mg (06/19/2019), 180 mg (07/10/2019), 180 mg (08/05/2019)  for chemotherapy treatment.      CHIEF COMPLIANT: Follow-up of metastatic breast cancer  INTERVAL HISTORY: Melissa [name] is a 54 y.o. with above-mentioned history of metastatic breast cancer with brain metastasiss/presection who is currently [place] every 3 weeks. She presents to the clinic todayfortreatment.   Patient tells me that she does not have any further issues with pain.  She is no longer taking Percocet.  ALLERGIES:  is allergic to aspirin; protonix [pantoprazole]; and iodinated diagnostic agents.  MEDICATIONS:  Current Outpatient Medications  Medication Sig Dispense Refill  . albuterol (VENTOLIN HFA) 108 (90 BASE) MCG/ACT inhaler Inhale 2 puffs into the lungs every 6 (six) hours as needed. 1 Inhaler 3  . budesonide-formoterol (SYMBICORT) 160-4.5 MCG/ACT inhaler Inhale 2 puffs into the lungs 2 (two) times daily.  3 Inhaler 3  . Isavuconazonium Sulfate (CRESEMBA) 186 MG CAPS Take 2 capsules (372 mg total) by mouth 3 (three) times daily. X 2 days. Followed by 2 capsules per day thereafter 60 capsule 6  . levalbuterol (XOPENEX) 0.63 MG/3ML nebulizer solution Take  3 mLs (0.63 mg total) by nebulization every 4 (four) hours as needed for wheezing or shortness of breath. 540 mL 6  . lidocaine-prilocaine (EMLA) cream Apply to affected area once 30 g 3  . Melatonin 5 MG TABS Take 5 mg by mouth at bedtime as needed.    . naproxen sodium (ALEVE) 220 MG tablet Take 440 mg by mouth 2 (two) times daily as needed (headache).     . oxyCODONE-acetaminophen (PERCOCET/ROXICET) 5-325 MG tablet Take 1 tablet by mouth every 6 (six) hours as needed for severe pain. (Patient not taking: Reported on 08/07/2019) 60 tablet 0   No current facility-administered medications for this visit.    PHYSICAL EXAMINATION: ECOG PERFORMANCE STATUS: 1 - Symptomatic but completely ambulatory  Vitals:   08/27/19 0822  BP: (!) 110/59  Pulse: 73  Resp: 17  Temp: 99.1 F (37.3 C)  SpO2: 100%   Filed Weights   08/27/19 0822  Weight: 116 lb 11.2 oz (52.9 kg)    LABORATORY DATA:  I have reviewed the data as listed CMP Latest Ref Rng & Units 08/05/2019 07/10/2019 06/19/2019  Glucose 70 - 99 mg/dL 80 84 76  BUN 6 - 20 mg/dL _0 Creatinine 0.44 - 1.00 mg/dL 0.91 0.87 0.86  Sodium 135 - 145 mmol/L 142 140 141  Potassium 3.5 - 5.1 mmol/L 4.3 4.3 4.0  Chloride 98 - 111 mmol/L 107 103 105  CO2 22 - 32 mmol/L _1 Calcium 8.9 - 10.3 mg/dL 9.7 9.8 9.5  Total Protein 6.5 - 8.1 g/dL 7.7 7.9 7.5  Total Bilirubin 0.3 - 1.2 mg/dL <0.2(L) <0.2(L) <0.2(L)  Alkaline Phos 38 - 126 U/L 108 164(H) 147(H)  AST 15 - 41 U/L _2 ALT 0 - 44 U/L _3 Lab Results  Component Value Date   WBC 9.1 08/27/2019   HGB 8.4 (L) 08/27/2019   HCT 26.5 (L) 08/27/2019   MCV 87.5 08/27/2019   PLT 482 (H) 08/27/2019   NEUTROABS 5.3 08/27/2019    ASSESSMENT & PLAN:  Breast cancer of upper-inner quadrant of left female breast ([place]) Left breast invasive ductal carcinoma ER/PR positive HER-2 positive initially 3.1 cm, Ki-67 70%, HER-2 amplified ratio 2.91 status post neoadjuvant  chemotherapy followed by surgery which showed 1.8 cm tumor 1 positive sentinel lymph node T1cN1 M0 stage IB status post radiation therapy and Herceptin maintenanceand tooktamoxifen 06/05/2013-08/11/2017  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 Anti Her 2 therapy with Lapatinibstarted 09/17/2017-05/14/2019: Stopped for progression 3.I discontinuedtamoxifen and started her on letrozole 2.5 mg daily.05/14/2019 stopped for progression 4.Stereotactic radiosurgery 12/19/2017 to the new right parietal lobe metastases. -------------------------------------------------------------------------------------------------------------------- Liver Biopsy 06/05/19: Metastatic cancer, ER/PR: 0%, Her 2: 3+ Positive, Ki 67: 20%  Current treatment: Kadcyla every 3 weeks, today is cycle 4 Chest wall pain: Marked improvement Clinical response: Remarkable improvement in pain and discomfort. She is no longer taking narcotic pain medication. 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  Kadcyla toxicities: 1.Diarrhea: Well controlled 2.Chronic mild peripheral neuropathy: Stable 3.  Normocytic  anemia: Probably due to treatment.  Prior to all of the treatment start she was normal on the hemoglobin.  Her hemoglobin today is 8.4.  We are monitoring closely.  If her hemoglobin drops below 8 then we may have to reduce the dosage of Kadcyla.  Left supraclavicular lymphadenopathy: Monitoring Sternal pain: Resolved Return to clinic in 3 weeks for cycle 5. The plan is to perform a PET CT scan in April after her sixth round of Kadcyla.   No orders of the defined types were placed in this encounter.  The patient has a good understanding of the overall plan. she agrees with it. she will call with any problems that may develop  before the next visit here.  Total time spent: 30 mins including face to face time and time spent for planning, charting and coordination of care  Rulon Eisenmenger, MD, MPH 08/27/2019  I, Molly Dorshimer, am acting as scribe for Dr. Nicholas Lose.  I have reviewed the above documentation for accuracy and completeness, and I agree with the above.

## 2019-08-27 ENCOUNTER — Inpatient Hospital Stay: Payer: PRIVATE HEALTH INSURANCE

## 2019-08-27 ENCOUNTER — Inpatient Hospital Stay (HOSPITAL_BASED_OUTPATIENT_CLINIC_OR_DEPARTMENT_OTHER): Payer: PRIVATE HEALTH INSURANCE | Admitting: Hematology and Oncology

## 2019-08-27 ENCOUNTER — Other Ambulatory Visit: Payer: Self-pay

## 2019-08-27 ENCOUNTER — Inpatient Hospital Stay: Payer: PRIVATE HEALTH INSURANCE | Attending: Medical

## 2019-08-27 VITALS — BP 102/56 | HR 77 | Temp 98.0°F | Resp 18

## 2019-08-27 DIAGNOSIS — C50212 Malignant neoplasm of upper-inner quadrant of left female breast: Secondary | ICD-10-CM | POA: Diagnosis present

## 2019-08-27 DIAGNOSIS — Z5112 Encounter for antineoplastic immunotherapy: Secondary | ICD-10-CM | POA: Diagnosis present

## 2019-08-27 DIAGNOSIS — Z95828 Presence of other vascular implants and grafts: Secondary | ICD-10-CM

## 2019-08-27 DIAGNOSIS — Z171 Estrogen receptor negative status [ER-]: Secondary | ICD-10-CM

## 2019-08-27 DIAGNOSIS — C50912 Malignant neoplasm of unspecified site of left female breast: Secondary | ICD-10-CM

## 2019-08-27 DIAGNOSIS — C7931 Secondary malignant neoplasm of brain: Secondary | ICD-10-CM

## 2019-08-27 LAB — CBC WITH DIFFERENTIAL/PLATELET
Abs Immature Granulocytes: 0.02 10*3/uL (ref 0.00–0.07)
Basophils Absolute: 0.1 10*3/uL (ref 0.0–0.1)
Basophils Relative: 1 %
Eosinophils Absolute: 1 10*3/uL — ABNORMAL HIGH (ref 0.0–0.5)
Eosinophils Relative: 11 %
HCT: 26.5 % — ABNORMAL LOW (ref 36.0–46.0)
Hemoglobin: 8.4 g/dL — ABNORMAL LOW (ref 12.0–15.0)
Immature Granulocytes: 0 %
Lymphocytes Relative: 21 %
Lymphs Abs: 1.9 10*3/uL (ref 0.7–4.0)
MCH: 27.7 pg (ref 26.0–34.0)
MCHC: 31.7 g/dL (ref 30.0–36.0)
MCV: 87.5 fL (ref 80.0–100.0)
Monocytes Absolute: 0.8 10*3/uL (ref 0.1–1.0)
Monocytes Relative: 9 %
Neutro Abs: 5.3 10*3/uL (ref 1.7–7.7)
Neutrophils Relative %: 58 %
Platelets: 482 10*3/uL — ABNORMAL HIGH (ref 150–400)
RBC: 3.03 MIL/uL — ABNORMAL LOW (ref 3.87–5.11)
RDW: 17.2 % — ABNORMAL HIGH (ref 11.5–15.5)
WBC: 9.1 10*3/uL (ref 4.0–10.5)
nRBC: 0 % (ref 0.0–0.2)

## 2019-08-27 LAB — COMPREHENSIVE METABOLIC PANEL
ALT: 11 U/L (ref 0–44)
AST: 16 U/L (ref 15–41)
Albumin: 3.4 g/dL — ABNORMAL LOW (ref 3.5–5.0)
Alkaline Phosphatase: 113 U/L (ref 38–126)
Anion gap: 10 (ref 5–15)
BUN: 10 mg/dL (ref 6–20)
CO2: 25 mmol/L (ref 22–32)
Calcium: 9.3 mg/dL (ref 8.9–10.3)
Chloride: 105 mmol/L (ref 98–111)
Creatinine, Ser: 0.85 mg/dL (ref 0.44–1.00)
GFR calc Af Amer: >60 mL/min (ref >60)
GFR calc non Af Amer: >60 mL/min (ref >60)
Glucose, Bld: 83 mg/dL (ref 70–99)
Potassium: 4.1 mmol/L (ref 3.5–5.1)
Sodium: 140 mmol/L (ref 135–145)
Total Bilirubin: <0.2 mg/dL — ABNORMAL LOW (ref 0.3–1.2)
Total Protein: 7.7 g/dL (ref 6.5–8.1)

## 2019-08-27 MED ORDER — ACETAMINOPHEN 325 MG PO TABS
650.0000 mg | ORAL_TABLET | Freq: Once | ORAL | Status: AC
  Administered 2019-08-27: 650 mg via ORAL

## 2019-08-27 MED ORDER — SODIUM CHLORIDE 0.9 % IV SOLN
Freq: Once | INTRAVENOUS | Status: AC
  Filled 2019-08-27: qty 250

## 2019-08-27 MED ORDER — SODIUM CHLORIDE 0.9% FLUSH
10.0000 mL | INTRAVENOUS | Status: DC | PRN
  Administered 2019-08-27: 10 mL
  Filled 2019-08-27: qty 10

## 2019-08-27 MED ORDER — SODIUM CHLORIDE 0.9 % IV SOLN
3.6000 mg/kg | Freq: Once | INTRAVENOUS | Status: AC
  Administered 2019-08-27: 180 mg via INTRAVENOUS
  Filled 2019-08-27: qty 9

## 2019-08-27 MED ORDER — DIPHENHYDRAMINE HCL 25 MG PO CAPS
ORAL_CAPSULE | ORAL | Status: AC
  Filled 2019-08-27: qty 1

## 2019-08-27 MED ORDER — ACETAMINOPHEN 325 MG PO TABS
ORAL_TABLET | ORAL | Status: AC
  Filled 2019-08-27: qty 2

## 2019-08-27 MED ORDER — DIPHENHYDRAMINE HCL 25 MG PO CAPS
25.0000 mg | ORAL_CAPSULE | Freq: Once | ORAL | Status: AC
  Administered 2019-08-27: 25 mg via ORAL

## 2019-08-27 MED ORDER — HEPARIN SOD (PORK) LOCK FLUSH 100 UNIT/ML IV SOLN
500.0000 [IU] | Freq: Once | INTRAVENOUS | Status: AC | PRN
  Administered 2019-08-27: 500 [IU]
  Filled 2019-08-27: qty 5

## 2019-08-27 NOTE — Patient Instructions (Signed)
[facility] Discharge Instructions for Patients Receiving Chemotherapy  Today you received the following chemotherapy agents :  Kadcyla.  To help prevent nausea and vomiting after your treatment, we encourage you to take your nausea medication as prescribed.   If you develop nausea and vomiting that is not controlled by your nausea medication, call the clinic.   BELOW ARE SYMPTOMS THAT SHOULD BE REPORTED IMMEDIATELY:  *FEVER GREATER THAN 100.5 F  *CHILLS WITH OR WITHOUT FEVER  NAUSEA AND VOMITING THAT IS NOT CONTROLLED WITH YOUR NAUSEA MEDICATION  *UNUSUAL SHORTNESS OF BREATH  *UNUSUAL BRUISING OR BLEEDING  TENDERNESS IN MOUTH AND THROAT WITH OR WITHOUT PRESENCE OF ULCERS  *URINARY PROBLEMS  *BOWEL PROBLEMS  UNUSUAL RASH Items with * indicate a potential emergency and should be followed up as soon as possible.  Feel free to call the clinic should you have any questions or concerns. The clinic phone number is [phone].  Please show the CHEMO ALERT CARD at check-in to the Emergency Department and triage nurse.   

## 2019-08-27 NOTE — Assessment & Plan Note (Signed)
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 Anti Her 2 therapy with Lapatinibstarted 09/17/2017-05/14/2019: Stopped for progression 3.I discontinuedtamoxifen and started her on letrozole 2.5 mg daily.05/14/2019 stopped for progression 4.Stereotactic radiosurgery 12/19/2017 to the new right parietal lobe metastases. -------------------------------------------------------------------------------------------------------------------- Liver Biopsy 06/05/19: Metastatic cancer, ER/PR: 0%, Her 2: 3+ Positive, Ki 67: 20%  Current treatment: Kadcyla every 3 weeks, today is cycle 4 Chest wall pain: Marked improvement Clinical response: Remarkable improvement in pain and discomfort. She is no longer taking narcotic pain medication. 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  Kadcyla toxicities: 1.Diarrhea: Well controlled 2.Chronic mild peripheral neuropathy: Stable 3.  Normocytic anemia: Probably due to treatment.  Prior to all of the treatment start she was normal on the hemoglobin.  Left supraclavicular lymphadenopathy: Monitoring Sternal pain: Resolved Return to clinic in 3 weeks for cycle 5.

## 2019-09-11 ENCOUNTER — Other Ambulatory Visit: Payer: PRIVATE HEALTH INSURANCE

## 2019-09-11 ENCOUNTER — Ambulatory Visit: Payer: PRIVATE HEALTH INSURANCE

## 2019-09-11 ENCOUNTER — Ambulatory Visit: Payer: PRIVATE HEALTH INSURANCE | Admitting: Adult Health

## 2019-09-13 ENCOUNTER — Other Ambulatory Visit: Payer: Self-pay | Admitting: *Deleted

## 2019-09-13 DIAGNOSIS — C7931 Secondary malignant neoplasm of brain: Secondary | ICD-10-CM

## 2019-09-17 ENCOUNTER — Inpatient Hospital Stay: Payer: PRIVATE HEALTH INSURANCE

## 2019-09-17 ENCOUNTER — Inpatient Hospital Stay (HOSPITAL_BASED_OUTPATIENT_CLINIC_OR_DEPARTMENT_OTHER): Payer: PRIVATE HEALTH INSURANCE | Admitting: Adult Health

## 2019-09-17 ENCOUNTER — Other Ambulatory Visit: Payer: Self-pay | Admitting: Radiation Therapy

## 2019-09-17 ENCOUNTER — Inpatient Hospital Stay: Payer: PRIVATE HEALTH INSURANCE | Attending: Medical

## 2019-09-17 ENCOUNTER — Other Ambulatory Visit: Payer: Self-pay

## 2019-09-17 ENCOUNTER — Encounter: Payer: Self-pay | Admitting: Adult Health

## 2019-09-17 VITALS — BP 105/65 | HR 68

## 2019-09-17 VITALS — BP 117/67 | HR 75 | Temp 99.1°F | Resp 18 | Ht 60.2 in | Wt 118.1 lb

## 2019-09-17 DIAGNOSIS — C50912 Malignant neoplasm of unspecified site of left female breast: Secondary | ICD-10-CM

## 2019-09-17 DIAGNOSIS — C7931 Secondary malignant neoplasm of brain: Secondary | ICD-10-CM

## 2019-09-17 DIAGNOSIS — Z452 Encounter for adjustment and management of vascular access device: Secondary | ICD-10-CM | POA: Insufficient documentation

## 2019-09-17 DIAGNOSIS — Z5112 Encounter for antineoplastic immunotherapy: Secondary | ICD-10-CM | POA: Insufficient documentation

## 2019-09-17 DIAGNOSIS — Z171 Estrogen receptor negative status [ER-]: Secondary | ICD-10-CM

## 2019-09-17 DIAGNOSIS — Z95828 Presence of other vascular implants and grafts: Secondary | ICD-10-CM

## 2019-09-17 DIAGNOSIS — C7951 Secondary malignant neoplasm of bone: Secondary | ICD-10-CM | POA: Diagnosis not present

## 2019-09-17 DIAGNOSIS — C50212 Malignant neoplasm of upper-inner quadrant of left female breast: Secondary | ICD-10-CM | POA: Diagnosis present

## 2019-09-17 DIAGNOSIS — C787 Secondary malignant neoplasm of liver and intrahepatic bile duct: Secondary | ICD-10-CM | POA: Diagnosis not present

## 2019-09-17 LAB — CBC WITH DIFFERENTIAL/PLATELET
Abs Immature Granulocytes: 0.02 10*3/uL (ref 0.00–0.07)
Basophils Absolute: 0.1 10*3/uL (ref 0.0–0.1)
Basophils Relative: 1 %
Eosinophils Absolute: 0.5 10*3/uL (ref 0.0–0.5)
Eosinophils Relative: 7 %
HCT: 27.2 % — ABNORMAL LOW (ref 36.0–46.0)
Hemoglobin: 8.5 g/dL — ABNORMAL LOW (ref 12.0–15.0)
Immature Granulocytes: 0 %
Lymphocytes Relative: 28 %
Lymphs Abs: 2.2 10*3/uL (ref 0.7–4.0)
MCH: 27.6 pg (ref 26.0–34.0)
MCHC: 31.3 g/dL (ref 30.0–36.0)
MCV: 88.3 fL (ref 80.0–100.0)
Monocytes Absolute: 0.7 10*3/uL (ref 0.1–1.0)
Monocytes Relative: 9 %
Neutro Abs: 4.3 10*3/uL (ref 1.7–7.7)
Neutrophils Relative %: 55 %
Platelets: 406 10*3/uL — ABNORMAL HIGH (ref 150–400)
RBC: 3.08 MIL/uL — ABNORMAL LOW (ref 3.87–5.11)
RDW: 18.1 % — ABNORMAL HIGH (ref 11.5–15.5)
WBC: 7.8 10*3/uL (ref 4.0–10.5)
nRBC: 0 % (ref 0.0–0.2)

## 2019-09-17 LAB — COMPREHENSIVE METABOLIC PANEL
ALT: 12 U/L (ref 0–44)
AST: 18 U/L (ref 15–41)
Albumin: 3.5 g/dL (ref 3.5–5.0)
Alkaline Phosphatase: 99 U/L (ref 38–126)
Anion gap: 10 (ref 5–15)
BUN: 12 mg/dL (ref 6–20)
CO2: 26 mmol/L (ref 22–32)
Calcium: 9.2 mg/dL (ref 8.9–10.3)
Chloride: 107 mmol/L (ref 98–111)
Creatinine, Ser: 0.99 mg/dL (ref 0.44–1.00)
GFR calc Af Amer: >60 mL/min (ref >60)
GFR calc non Af Amer: >60 mL/min (ref >60)
Glucose, Bld: 79 mg/dL (ref 70–99)
Potassium: 4.5 mmol/L (ref 3.5–5.1)
Sodium: 143 mmol/L (ref 135–145)
Total Bilirubin: <0.2 mg/dL — ABNORMAL LOW (ref 0.3–1.2)
Total Protein: 7.6 g/dL (ref 6.5–8.1)

## 2019-09-17 MED ORDER — DIPHENHYDRAMINE HCL 25 MG PO CAPS
25.0000 mg | ORAL_CAPSULE | Freq: Once | ORAL | Status: AC
  Administered 2019-09-17: 10:00:00 25 mg via ORAL

## 2019-09-17 MED ORDER — ACETAMINOPHEN 325 MG PO TABS
ORAL_TABLET | ORAL | Status: AC
  Filled 2019-09-17: qty 2

## 2019-09-17 MED ORDER — SODIUM CHLORIDE 0.9 % IV SOLN
3.6000 mg/kg | Freq: Once | INTRAVENOUS | Status: AC
  Administered 2019-09-17: 11:00:00 180 mg via INTRAVENOUS
  Filled 2019-09-17: qty 9

## 2019-09-17 MED ORDER — SODIUM CHLORIDE 0.9% FLUSH
10.0000 mL | INTRAVENOUS | Status: DC | PRN
  Administered 2019-09-17: 12:00:00 10 mL
  Filled 2019-09-17: qty 10

## 2019-09-17 MED ORDER — ALTEPLASE 2 MG IJ SOLR
2.0000 mg | Freq: Once | INTRAMUSCULAR | Status: AC | PRN
  Administered 2019-09-17: 2 mg
  Filled 2019-09-17: qty 2

## 2019-09-17 MED ORDER — SODIUM CHLORIDE 0.9 % IV SOLN
Freq: Once | INTRAVENOUS | Status: AC
  Filled 2019-09-17: qty 250

## 2019-09-17 MED ORDER — DIPHENHYDRAMINE HCL 25 MG PO CAPS
ORAL_CAPSULE | ORAL | Status: AC
  Filled 2019-09-17: qty 1

## 2019-09-17 MED ORDER — ALTEPLASE 2 MG IJ SOLR
INTRAMUSCULAR | Status: AC
  Filled 2019-09-17: qty 2

## 2019-09-17 MED ORDER — SODIUM CHLORIDE 0.9% FLUSH
10.0000 mL | INTRAVENOUS | Status: DC | PRN
  Administered 2019-09-17: 10 mL
  Filled 2019-09-17: qty 10

## 2019-09-17 MED ORDER — ACETAMINOPHEN 325 MG PO TABS
650.0000 mg | ORAL_TABLET | Freq: Once | ORAL | Status: AC
  Administered 2019-09-17: 10:00:00 650 mg via ORAL

## 2019-09-17 MED ORDER — PREDNISONE 50 MG PO TABS: ORAL_TABLET | ORAL | 4 refills | Status: DC

## 2019-09-17 MED ORDER — HEPARIN SOD (PORK) LOCK FLUSH 100 UNIT/ML IV SOLN
500.0000 [IU] | Freq: Once | INTRAVENOUS | Status: AC | PRN
  Administered 2019-09-17: 12:00:00 500 [IU]
  Filled 2019-09-17: qty 5

## 2019-09-17 NOTE — Assessment & Plan Note (Addendum)
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 Anti Her 2 therapy with Lapatinibstarted 09/17/2017-05/14/2019: Stopped for progression 3.I discontinuedtamoxifen and started her on letrozole 2.5 mg daily.05/14/2019 stopped for progression 4.Stereotactic radiosurgery 12/19/2017 to the new right parietal lobe metastases. -------------------------------------------------------------------------------------------------------------------- Liver Biopsy 06/05/19: Metastatic cancer, ER/PR: 0%, Her 2: 3+ Positive, Ki 67: 20% Patient has metastases to liver, bone, brain, and questionably lung  Current treatment: Kadcyla every 3 weeks, Most recent imaging: CT of the chest 07/19/2019 done for aspergillosis showed improvement in the sternal lesion as well as in the lymph nodes. Echo on 06/16/2020 shows EF of 60-65%  Kadcyla toxicities: 1.Diarrhea: Well controlled 2.Chronic mild peripheral neuropathy: Stable 3.  Normocytic anemia: ? Treatment related, following  Bone metastases: she has not started Xgeva.  I gave her info on this.  She needs dental appointment because she needs work on her lower teeth.  I reviewed risks/benefits of xgeva, along with information about calcium intake, and osteonecrosis side effect.  She will hopefully get this work taken care of in the next 6 weeks.    I ordered repeat echo today as well as repeat imaging with CT chest abdomen and pelvis in 10/2019.    We will see Kasi back in 3 weeks for labs, f/u, and her next treatment.

## 2019-09-17 NOTE — Patient Instructions (Signed)
Denosumab injection What is this medicine? DENOSUMAB (den oh sue mab) slows bone breakdown. Prolia is used to treat osteoporosis in women after menopause and in men, and in people who are taking corticosteroids for 6 months or more. Xgeva is used to treat a high calcium level due to cancer and to prevent bone fractures and other bone problems caused by multiple myeloma or cancer bone metastases. Xgeva is also used to treat giant cell tumor of the bone. This medicine may be used for other purposes; ask your health care provider or pharmacist if you have questions. COMMON BRAND NAME(S): Prolia, XGEVA What should I tell my health care provider before I take this medicine? They need to know if you have any of these conditions:  dental disease  having surgery or tooth extraction  infection  kidney disease  low levels of calcium or Vitamin D in the blood  malnutrition  on hemodialysis  skin conditions or sensitivity  thyroid or parathyroid disease  an unusual reaction to denosumab, other medicines, foods, dyes, or preservatives  pregnant or trying to get pregnant  breast-feeding How should I use this medicine? This medicine is for injection under the skin. It is given by a health care professional in a hospital or clinic setting. A special MedGuide will be given to you before each treatment. Be sure to read this information carefully each time. For Prolia, talk to your pediatrician regarding the use of this medicine in children. Special care may be needed. For Xgeva, talk to your pediatrician regarding the use of this medicine in children. While this drug may be prescribed for children as young as 13 years for selected conditions, precautions do apply. Overdosage: If you think you have taken too much of this medicine contact a poison control center or emergency room at once. NOTE: This medicine is only for you. Do not share this medicine with others. What if I miss a dose? It is  important not to miss your dose. Call your doctor or health care professional if you are unable to keep an appointment. What may interact with this medicine? Do not take this medicine with any of the following medications:  other medicines containing denosumab This medicine may also interact with the following medications:  medicines that lower your chance of fighting infection  steroid medicines like prednisone or cortisone This list may not describe all possible interactions. Give your health care provider a list of all the medicines, herbs, non-prescription drugs, or dietary supplements you use. Also tell them if you smoke, drink alcohol, or use illegal drugs. Some items may interact with your medicine. What should I watch for while using this medicine? Visit your doctor or health care professional for regular checks on your progress. Your doctor or health care professional may order blood tests and other tests to see how you are doing. Call your doctor or health care professional for advice if you get a fever, chills or sore throat, or other symptoms of a cold or flu. Do not treat yourself. This drug may decrease your body's ability to fight infection. Try to avoid being around people who are sick. You should make sure you get enough calcium and vitamin D while you are taking this medicine, unless your doctor tells you not to. Discuss the foods you eat and the vitamins you take with your health care professional. See your dentist regularly. Brush and floss your teeth as directed. Before you have any dental work done, tell your dentist you are   receiving this medicine. Do not become pregnant while taking this medicine or for 5 months after stopping it. Talk with your doctor or health care professional about your birth control options while taking this medicine. Women should inform their doctor if they wish to become pregnant or think they might be pregnant. There is a potential for serious side  effects to an unborn child. Talk to your health care professional or pharmacist for more information. What side effects may I notice from receiving this medicine? Side effects that you should report to your doctor or health care professional as soon as possible:  allergic reactions like skin rash, itching or hives, swelling of the face, lips, or tongue  bone pain  breathing problems  dizziness  jaw pain, especially after dental work  redness, blistering, peeling of the skin  signs and symptoms of infection like fever or chills; cough; sore throat; pain or trouble passing urine  signs of low calcium like fast heartbeat, muscle cramps or muscle pain; pain, tingling, numbness in the hands or feet; seizures  unusual bleeding or bruising  unusually weak or tired Side effects that usually do not require medical attention (report to your doctor or health care professional if they continue or are bothersome):  constipation  diarrhea  headache  joint pain  loss of appetite  muscle pain  runny nose  tiredness  upset stomach This list may not describe all possible side effects. Call your doctor for medical advice about side effects. You may report side effects to FDA at 1-800-FDA-1088. Where should I keep my medicine? This medicine is only given in a clinic, doctor's office, or other health care setting and will not be stored at home. NOTE: This sheet is a summary. It may not cover all possible information. If you have questions about this medicine, talk to your doctor, pharmacist, or health care provider.  2020 [employer] Standard (2017-11-03 16:10:44)

## 2019-09-17 NOTE — Progress Notes (Signed)
[place] [facility] Follow up:    Patient, [name] No address on file   DIAGNOSIS: Cancer Staging Breast cancer of upper-inner quadrant of left female breast ([place]) Staging form: Breast, AJCC 7th Edition - Clinical: Stage IIA (T2, N0, cM0) - Unsigned Staging comments: Staged at breast conference 12.4.13  - Pathologic: Stage IIA (T1c, N1, cM0) - Unsigned   SUMMARY OF ONCOLOGIC HISTORY: Oncology History  Breast cancer of upper-inner quadrant of left female breast ([place])  06/08/2012 Initial Diagnosis   invasive ductal carcinoma that was ER positive PR positive HER-2/neu positive measuring 3.1 cm by MRI criteria. Ki-67 was 70% HER-2 was amplified with a ratio 2.91   07/12/2012 - 07/17/2013 Neo-Adjuvant Chemotherapy   TCH 6 followed by Herceptin maintenance   12/11/2012 Surgery   Left breast lumpectomy: 1.8 cm tumor 1 positive sentinel node, axillary lymph node dissection 02/08/2013 showed 0/13 lymph nodes   03/25/2013 - 05/06/2013 Radiation Therapy   Adjuvant radiation therapy   06/05/2013 - 07/20/2017 Anti-estrogen oral therapy   Tamoxifen 20 mg daily   07/27/2017 Relapse/Recurrence   MRI Brain: 3.4 x 2.9 x 2.9 cm RIGHT frontal lobe mass with imaging characteristics of solitary metastasis. Extensive vasogenic edema resulting in 9 mm RIGHT to LEFT midline shift. Equivocal very early LEFT ventricle entrapment.    08/04/2017 Surgery   Rt frontal brain resection: Poorly differentiated tumor IHC suggests breast primary ER and PR Positive   08/25/2017 - 09/04/2017 Radiation Therapy   Stereotactic radiation   09/18/2017 -  Anti-estrogen oral therapy   Lapatinib with letrozole   12/18/2017 - 12/19/2017 Radiation Therapy   New right parietal lobe metastases status post [facility]   05/09/2019 Relapse/Recurrence   Interval increase in size of the enhancing nodular left internal mammary soft tissue 2.8 cm.  Redemonstrated enlarged supraclavicular, lower cervical and lower posterior  cervical nodes unchanged.  Interval increase in the bony erosion of the posterior and lateral left third rib, increasing soft tissue lesion eroding the left sternal body 3.1 cm was 2.5 cm.  Bronchiectatic changes   06/19/2019 -  Chemotherapy   The patient had ado-trastuzumab emtansine (KADCYLA) 180 mg in sodium chloride 0.9 % 250 mL chemo infusion, 3.6 mg/kg = 180 mg, Intravenous, Once, 5 of 13 cycles Administration: 180 mg (06/19/2019), 180 mg (07/10/2019), 180 mg (09/17/2019), 180 mg (08/05/2019), 180 mg (08/27/2019)  for chemotherapy treatment.    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      CURRENT THERAPY: Kadcyla  INTERVAL HISTORY: Melissa [name] 54 y.o. female returns for evaluation of her metastatic breast cancer.  She is feeling well.  She notes that when she receives Kadcyla she is wiped out for the day and then she is back at it.  She is still smoking cigarettes, and is not yet ready to quit.  She enjoys working in her garden, and doing artwork with Air traffic controller on canvas.  She has no pain.  She is feeling well and has no physical issues.     Patient Active Problem List   Diagnosis Date Noted  .  Breast cancer of upper-inner quadrant of left female breast ([place]) 06/08/2012    Priority: High  . Port-A-Cath in place 08/05/2019  . 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 Mediastinal adenopathy   . H/O coccidioidomycosis 05/16/2018  . DOE (dyspnea on exertion) 05/03/2018  . Diarrhea 04/22/2018  . Cavitary lesion of lung in area of previous cyts in apex  of Sup Segment of LLL 03/29/2018  . Aortic atherosclerosis ([place]) 01/22/2018  . Emphysema lung ([place]) 01/22/2018  . CAP (community acquired pneumonia) 11/23/2017  . Metastasis to brain ([place]) 08/04/2017  . Brain metastasis ([place]) 07/27/2017  . Nicotine abuse 07/27/2017  . Migraines 07/27/2017  . Malignant tumor of breast ([place]) 06/12/2017  . HCAP (healthcare-associated pneumonia) 11/26/2016  . Upper airway cough syndrome 08/10/2016  . Abnormal echocardiogram 08/07/2013  . Chest tightness or pressure 08/07/2013  . Hx of radiation therapy   . Edema of left lower extremity 11/19/2012  . Tachycardia 09/20/2012  . Obstructive bronchiectasis ([place]) 01/26/2011  . Cigarette smoker 01/26/2011    is allergic to aspirin; protonix [pantoprazole]; and iodinated diagnostic agents.  MEDICAL HISTORY: Past Medical History:  Diagnosis Date  . Anemia   . Arthritis    knees and hips  . Asthma   . Breast cancer ([place])   . Bronchiectasis ([place])   . Bronchiolitis   . Cancer [facility])    breast cancer 2014  . Complication of anesthesia    bp dropped + desat   . COPD (chronic obstructive pulmonary disease) ([place])   . Dyspnea    DOE  . GERD (gastroesophageal reflux disease)   . H/O coccidioidomycosis    was reason for lung lobectomy  . Headache(784.0)    due to eye strain or not eating  . History of anemia    no current problem  . History of asthma    as a child  . History of breast cancer 2014   left  . History of chemotherapy    finished 07/17/2013  . History of hiatal hernia    AGE 41  . Hx of radiation therapy 03/25/13-05/06/13   left breast 5000 cGy/25 sessions, left breast boost 1000 cGy/5 sessions  . Pneumonia    LAST FLARE UP 01/2018  . Runny nose 07/30/2013   clear drainage  . Wears dentures    upper    SURGICAL HISTORY: Past Surgical History:  Procedure Laterality Date  . APPLICATION OF CRANIAL NAVIGATION N/A 08/04/2017   Procedure: APPLICATION OF CRANIAL NAVIGATION;  Surgeon: [name],  [name], MD;  Location: [place];  Service: Neurosurgery;  Laterality: N/A;  . AXILLARY LYMPH NODE DISSECTION Left 02/08/2013   Procedure: LEFT AXILLARY DISSECTION;  Surgeon: Edward Jolly, MD;  Location: [place];  Service: General;  Laterality: Left;  . BREAST CYST EXCISION Right 2006  . BREAST LUMPECTOMY Left 2014  . BREAST LUMPECTOMY WITH NEEDLE LOCALIZATION AND AXILLARY SENTINEL LYMPH NODE BX Left 12/31/2012   Procedure: NEEDLE LOCALIZATION LEFT BREAST LUMPECTOMY AND LEFT AXILLARY SENTENIAL LYMPH NODE BX;  Surgeon: Edward Jolly, MD;  Location: [place];  Service: General;  Laterality: Left;  . CESAREAN SECTION  1995/1996  . CRANIOTOMY Right 08/04/2017   Procedure: Right Frontal craniotomy for resection of tumor with stereotactic navigation;  Surgeon: [name], [name], MD;  Location: [place];  Service: Neurosurgery;  Laterality: Right;  Right Frontal craniotomy for resection of tumor with stereotactic navigation  . IR IMAGING GUIDED PORT INSERTION  05/31/2019  .  LUNG LOBECTOMY Left 05/1996   upper lobe - due to [facility] Fever  . PORT-A-CATH REMOVAL Right 08/02/2013   Procedure: REMOVAL PORT-A-CATH;  Surgeon: Edward Jolly, MD;  Location: [place];  Service: General;  Laterality: Right;  . PORTACATH PLACEMENT  07/02/2012   Procedure: INSERTION PORT-A-CATH;  Surgeon: Edward Jolly, MD;  Location: [place];  Service: General;  Laterality: N/A;  right  . VIDEO BRONCHOSCOPY WITH ENDOBRONCHIAL ULTRASOUND N/A 05/23/2018   Procedure: VIDEO BRONCHOSCOPY WITH ENDOBRONCHIAL ULTRASOUND;  Surgeon: Garner Nash, DO;  Location: [place] [facility];  Service: Thoracic;  Laterality: N/A;    SOCIAL HISTORY: Social History   Socioeconomic History  . Marital status: Married    Spouse name: Not on file  . Number of children: 2  . Years of education: Not on file  . Highest education level: Not on file  Occupational History   . Occupation: Secondary school teacher  Tobacco Use  . Smoking status: Current Every Day Smoker    Packs/day: 0.75    Years: 24.00    Pack years: 18.00    Types: Cigarettes  . Smokeless tobacco: Never Used  Substance and Sexual Activity  . Alcohol use: No  . Drug use: No  . Sexual activity: Not Currently  Other Topics Concern  . Not on file  Social History Narrative  . Not on file   Social Determinants of Health   Financial Resource Strain:   . Difficulty of Paying Living Expenses: Not on file  Food Insecurity:   . Worried About Charity fundraiser in the Last Year: Not on file  . Ran Out of Food in the Last Year: Not on file  Transportation Needs:   . Lack of Transportation (Medical): Not on file  . Lack of Transportation (Non-Medical): Not on file  Physical Activity:   . Days of Exercise per Week: Not on file  . Minutes of Exercise per Session: Not on file  Stress:   . Feeling of Stress : Not on file  Social Connections:   . Frequency of Communication with Friends and Family: Not on file  . Frequency of Social Gatherings with Friends and Family: Not on file  . Attends Religious Services: Not on file  . Active Member of Clubs or Organizations: Not on file  . Attends Archivist Meetings: Not on file  . Marital Status: Not on file  Intimate Partner Violence:   . Fear of Current or Ex-Partner: Not on file  . Emotionally Abused: Not on file  . Physically Abused: Not on file  . Sexually Abused: Not on file    FAMILY HISTORY: Family History  Problem Relation Age of Onset  . Emphysema Mother        was a smoker  . Heart disease Mother   . Melanoma Mother        dx in her 86s  . Breast cancer Mother 54  . Asthma Brother   . Breast cancer Cousin        mother's maternal cousin; dx in her 60s    Review of Systems  Constitutional: Negative for appetite change, chills, fatigue, fever and unexpected weight change.  HENT:   Negative for hearing loss, lump/mass and  trouble swallowing.   Eyes: Negative for eye problems and icterus.  Respiratory: Negative for chest tightness, cough and shortness of breath.   Cardiovascular: Negative for chest pain, leg swelling and palpitations.  Gastrointestinal: Negative for abdominal distention, abdominal pain,  constipation, diarrhea, nausea and vomiting.  Endocrine: Negative for hot flashes.  Genitourinary: Negative for difficulty urinating.   Musculoskeletal: Negative for arthralgias.  Skin: Negative for itching and rash.  Neurological: Negative for dizziness, extremity weakness, headaches and numbness.  Hematological: Negative for adenopathy. Does not bruise/bleed easily.  Psychiatric/Behavioral: Negative for depression. The patient is not nervous/anxious.       PHYSICAL EXAMINATION  ECOG PERFORMANCE STATUS: 1 - Symptomatic but completely ambulatory  Vitals:   09/17/19 0851  BP: 117/67  Pulse: 75  Resp: 18  Temp: 99.1 F (37.3 C)  SpO2: 100%    Physical Exam Constitutional:      General: She is not in acute distress.    Appearance: Normal appearance. She is not toxic-appearing.  HENT:     Head: Normocephalic and atraumatic.     Mouth/Throat:     Mouth: Mucous membranes are dry.     Pharynx: Oropharynx is clear.  Eyes:     Pupils: Pupils are equal, round, and reactive to light.  Cardiovascular:     Rate and Rhythm: Normal rate and regular rhythm.     Pulses: Normal pulses.     Heart sounds: Normal heart sounds.  Pulmonary:     Effort: Pulmonary effort is normal. No respiratory distress.     Breath sounds: Normal breath sounds. No wheezing.  Abdominal:     General: Abdomen is flat. Bowel sounds are normal. There is no distension.     Palpations: Abdomen is soft. There is no mass.  Musculoskeletal:        General: No swelling.     Cervical back: Neck supple.  Lymphadenopathy:     Cervical: No cervical adenopathy.  Skin:    General: Skin is warm and dry.     Capillary Refill: Capillary  refill takes less than 2 seconds.     Findings: No rash.  Neurological:     General: No focal deficit present.     Mental Status: She is alert.  Psychiatric:        Mood and Affect: Mood normal.        Behavior: Behavior normal.     LABORATORY DATA:  CBC    Component Value Date/Time   WBC 7.8 09/17/2019 0820   RBC 3.08 (L) 09/17/2019 0820   HGB 8.5 (L) 09/17/2019 0820   HGB 10.3 (L) 05/14/2019 0917   HGB 13.1 08/12/2014 0955   HCT 27.2 (L) 09/17/2019 0820   HCT 40.4 08/12/2014 0955   PLT 406 (H) 09/17/2019 0820   PLT 425 (H) 05/14/2019 0917   PLT 228 08/12/2014 0955   MCV 88.3 09/17/2019 0820   MCV 98.2 08/12/2014 0955   MCH 27.6 09/17/2019 0820   MCHC 31.3 09/17/2019 0820   RDW 18.1 (H) 09/17/2019 0820   RDW 13.0 08/12/2014 0955   LYMPHSABS 2.2 09/17/2019 0820   LYMPHSABS 2.0 08/12/2014 0955   MONOABS 0.7 09/17/2019 0820   MONOABS 0.5 08/12/2014 0955   EOSABS 0.5 09/17/2019 0820   EOSABS 0.2 08/12/2014 0955   BASOSABS 0.1 09/17/2019 0820   BASOSABS 0.0 08/12/2014 0955    CMP     Component Value Date/Time   NA 143 09/17/2019 0820   NA 142 08/12/2014 0956   K 4.5 09/17/2019 0820   K 4.6 08/12/2014 0956   CL 107 09/17/2019 0820   CL 100 12/20/2012 1509   CO2 26 09/17/2019 0820   CO2 26 08/12/2014 0956   GLUCOSE 79 09/17/2019 0820   GLUCOSE  94 08/12/2014 0956   GLUCOSE 98 12/20/2012 1509   BUN 12 09/17/2019 0820   BUN 10.1 08/12/2014 0956   CREATININE 0.99 09/17/2019 0820   CREATININE 1.01 (H) 05/14/2019 0917   CREATININE 0.88 03/11/2019 1057   CREATININE 0.9 08/12/2014 0956   CALCIUM 9.2 09/17/2019 0820   CALCIUM 9.3 08/12/2014 0956   PROT 7.6 09/17/2019 0820   PROT 7.1 08/12/2014 0956   ALBUMIN 3.5 09/17/2019 0820   ALBUMIN 3.8 08/12/2014 0956   AST 18 09/17/2019 0820   AST 13 (L) 05/14/2019 0917   AST 15 08/12/2014 0956   ALT 12 09/17/2019 0820   ALT 9 05/14/2019 0917   ALT 11 08/12/2014 0956   ALKPHOS 99 09/17/2019 0820   ALKPHOS 68  08/12/2014 0956   BILITOT <0.2 (L) 09/17/2019 0820   BILITOT <0.2 (L) 05/14/2019 0917   BILITOT 0.25 08/12/2014 0956   GFRNONAA >60 09/17/2019 0820   GFRNONAA >60 05/14/2019 0917   GFRNONAA 76 03/11/2019 1057   GFRAA >60 09/17/2019 0820   GFRAA >60 05/14/2019 0917   GFRAA 88 03/11/2019 1057       ASSESSMENT and PLAN:   Breast cancer of upper-inner quadrant of left female breast ([place]) Left breast invasive ductal carcinoma ER/PR positive HER-2 positive initially 3.1 cm, Ki-67 70%, HER-2 amplified ratio 2.91 status post neoadjuvant chemotherapy followed by surgery which showed 1.8 cm tumor 1 positive sentinel lymph node T1cN1 M0 stage IB status post radiation therapy and Herceptin maintenanceand tooktamoxifen 06/05/2013-08/11/2017  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 Anti Her 2 therapy with Lapatinibstarted 09/17/2017-05/14/2019: Stopped for progression 3.I discontinuedtamoxifen and started her on letrozole 2.5 mg daily.05/14/2019 stopped for progression 4.Stereotactic radiosurgery 12/19/2017 to the new right parietal lobe metastases. -------------------------------------------------------------------------------------------------------------------- Liver Biopsy 06/05/19: Metastatic cancer, ER/PR: 0%, Her 2: 3+ Positive, Ki 67: 20% Patient has metastases to liver, bone, brain, and questionably lung  Current treatment: Kadcyla every 3 weeks, Most recent imaging: CT of the chest 07/19/2019 done for aspergillosis showed improvement in the sternal lesion as well as in the lymph nodes. Echo on 06/16/2020 shows EF of 60-65%  Kadcyla toxicities: 1.Diarrhea: Well controlled 2.Chronic mild peripheral neuropathy: Stable 3.  Normocytic anemia: ? Treatment related, following  Bone metastases: she has not started Xgeva.  I gave her info on this.  She needs dental appointment because she  needs work on her lower teeth.  I reviewed risks/benefits of xgeva, along with information about calcium intake, and osteonecrosis side effect.  She will hopefully get this work taken care of in the next 6 weeks.    I ordered repeat echo today as well as repeat imaging with CT chest abdomen and pelvis in 10/2019.    We will see Angle back in 3 weeks for labs, f/u, and her next treatment.     Orders Placed This Encounter  Procedures  . CT Chest W Contrast    Standing Status:   Future    Standing Expiration Date:   09/16/2020    Order Specific Question:   If indicated for the ordered procedure, I authorize the administration of contrast media per Radiology protocol    Answer:   Yes    Order Specific Question:   Is patient pregnant?    Answer:   No    Order Specific Question:   Preferred imaging location?    Answer:   [facility]    Order Specific Question:   Radiology  Contrast Protocol - do NOT remove file path    Answer:   \\[email or web address]  . CT Abdomen Pelvis W Contrast    Standing Status:   Future    Standing Expiration Date:   09/16/2020    Order Specific Question:   If indicated for the ordered procedure, I authorize the administration of contrast media per Radiology protocol    Answer:   Yes    Order Specific Question:   Is patient pregnant?    Answer:   No    Order Specific Question:   Preferred imaging location?    Answer:   [facility]    Order Specific Question:   Is Oral Contrast requested for this exam?    Answer:   Yes, Per Radiology protocol    Order Specific Question:   Radiology Contrast Protocol - do NOT remove file path    Answer:   \\[email or web address]  . ECHOCARDIOGRAM COMPLETE    Standing Status:   Future    Standing Expiration Date:   12/17/2020    Order Specific Question:   Where should this test be performed    Answer:   [place]    Order Specific Question:   Perflutren DEFINITY (image  enhancing agent) should be administered unless hypersensitivity or allergy exist    Answer:   Administer Perflutren    Order Specific Question:   Is a special reader required? (athlete or structural heart)    Answer:   No    Order Specific Question:   Reason for exam-Echo    Answer:   Chemotherapy evaluation  v87.41 / v58.11    All questions were answered. The patient knows to call the clinic with any problems, questions or concerns. We can certainly see the patient much sooner if necessary. This note was electronically signed.  Time spent: 30 minutes*  Wilber Bihari, NP 09/17/19 2:33 PM Medical Oncology and Hematology [facility] [place],  [address] Tel. [phone]    Fax. [phone]  *Total Encounter Time as defined by the Centers for Medicare and Medicaid Services includes, in addition to the face-to-face time of a patient visit (documented in the note above) non-face-to-face time: obtaining and reviewing outside history, ordering and reviewing medications, tests or procedures, care coordination (communications with other health care professionals or caregivers) and documentation in the medical record.

## 2019-09-23 ENCOUNTER — Telehealth: Payer: Self-pay | Admitting: *Deleted

## 2019-09-23 NOTE — Telephone Encounter (Signed)
Called pt and made aware of echocardiogram appt on 09/25/19 @9am . Pt verbalized understanding. Butch Penny in echo lab was the scheduler

## 2019-09-25 ENCOUNTER — Other Ambulatory Visit: Payer: Self-pay

## 2019-09-25 ENCOUNTER — Ambulatory Visit (HOSPITAL_COMMUNITY)
Admission: RE | Admit: 2019-09-25 | Discharge: 2019-09-25 | Disposition: A | Discharge status: Home / Self Care | Payer: PRIVATE HEALTH INSURANCE | Source: Ambulatory Visit | Attending: Adult Health | Admitting: Adult Health

## 2019-09-25 DIAGNOSIS — Z171 Estrogen receptor negative status [ER-]: Secondary | ICD-10-CM

## 2019-09-25 DIAGNOSIS — R0600 Dyspnea, unspecified: Secondary | ICD-10-CM | POA: Insufficient documentation

## 2019-09-25 DIAGNOSIS — Z01818 Encounter for other preprocedural examination: Secondary | ICD-10-CM | POA: Diagnosis present

## 2019-09-25 DIAGNOSIS — F17210 Nicotine dependence, cigarettes, uncomplicated: Secondary | ICD-10-CM | POA: Diagnosis not present

## 2019-09-25 DIAGNOSIS — R079 Chest pain, unspecified: Secondary | ICD-10-CM | POA: Diagnosis not present

## 2019-09-25 DIAGNOSIS — C50212 Malignant neoplasm of upper-inner quadrant of left female breast: Secondary | ICD-10-CM

## 2019-09-25 NOTE — Progress Notes (Signed)
  Echocardiogram 2D Echocardiogram has been performed.  Kalisi Bevill A Tarrin Menn 09/25/2019, 9:59 AM

## 2019-10-01 ENCOUNTER — Telehealth: Payer: Self-pay | Admitting: Radiation Oncology

## 2019-10-01 NOTE — Telephone Encounter (Signed)
Faxed complete and signed disability paperwork to [facility] as requested by patient. Fax confirmation of delivery obtained.

## 2019-10-02 ENCOUNTER — Other Ambulatory Visit: Payer: PRIVATE HEALTH INSURANCE

## 2019-10-02 ENCOUNTER — Ambulatory Visit: Payer: PRIVATE HEALTH INSURANCE

## 2019-10-02 ENCOUNTER — Ambulatory Visit: Payer: PRIVATE HEALTH INSURANCE | Admitting: Adult Health

## 2019-10-07 NOTE — Progress Notes (Signed)
[place] [facility] Follow up:    Patient, [name] No address on file   DIAGNOSIS: Cancer Staging Breast cancer of upper-inner quadrant of left female breast ([place]) Staging form: Breast, AJCC 7th Edition - Clinical: Stage IIA (T2, N0, cM0) - Unsigned Staging comments: Staged at breast conference 12.4.13  - Pathologic: Stage IIA (T1c, N1, cM0) - Unsigned   SUMMARY OF ONCOLOGIC HISTORY: Oncology History  Breast cancer of upper-inner quadrant of left female breast ([place])  06/08/2012 Initial Diagnosis   invasive ductal carcinoma that was ER positive PR positive HER-2/neu positive measuring 3.1 cm by MRI criteria. Ki-67 was 70% HER-2 was amplified with a ratio 2.91   07/12/2012 - 07/17/2013 Neo-Adjuvant Chemotherapy   TCH 6 followed by Herceptin maintenance   12/11/2012 Surgery   Left breast lumpectomy: 1.8 cm tumor 1 positive sentinel node, axillary lymph node dissection 02/08/2013 showed 0/13 lymph nodes   03/25/2013 - 05/06/2013 Radiation Therapy   Adjuvant radiation therapy   06/05/2013 - 07/20/2017 Anti-estrogen oral therapy   Tamoxifen 20 mg daily   07/27/2017 Relapse/Recurrence   MRI Brain: 3.4 x 2.9 x 2.9 cm RIGHT frontal lobe mass with imaging characteristics of solitary metastasis. Extensive vasogenic edema resulting in 9 mm RIGHT to LEFT midline shift. Equivocal very early LEFT ventricle entrapment.    08/04/2017 Surgery   Rt frontal brain resection: Poorly differentiated tumor IHC suggests breast primary ER and PR Positive   08/25/2017 - 09/04/2017 Radiation Therapy   Stereotactic radiation   09/18/2017 -  Anti-estrogen oral therapy   Lapatinib with letrozole   12/18/2017 - 12/19/2017 Radiation Therapy   New right parietal lobe metastases status post [facility]   05/09/2019 Relapse/Recurrence   Interval increase in size of the enhancing nodular left internal mammary soft tissue 2.8 cm.  Redemonstrated enlarged supraclavicular, lower cervical and lower posterior  cervical nodes unchanged.  Interval increase in the bony erosion of the posterior and lateral left third rib, increasing soft tissue lesion eroding the left sternal body 3.1 cm was 2.5 cm.  Bronchiectatic changes   06/19/2019 -  Chemotherapy   The patient had ado-trastuzumab emtansine (KADCYLA) 180 mg in sodium chloride 0.9 % 250 mL chemo infusion, 3.6 mg/kg = 180 mg, Intravenous, Once, 5 of 13 cycles Administration: 180 mg (06/19/2019), 180 mg (07/10/2019), 180 mg (09/17/2019), 180 mg (08/05/2019), 180 mg (08/27/2019)  for chemotherapy treatment.    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      CURRENT THERAPY: Geralyn Flash pending  INTERVAL HISTORY: Craig Guess Rafalski 54 y.o. female returns for evaluation prior to receiving Kadcyla.  She has not yet had dental work or clearance for [employer].  She is having seasonal allergies.  She has a mild cough, and nasal drainage, and she is managing this with flonase and benadryl.  Jerusalen notes she has been working out in the yard and has enjoyed the good weather.   She underwent repeat echocardiogram on 3/17 that showed an EF of 60-65%.     Patient Active Problem List   Diagnosis Date Noted  . Breast  cancer of upper-inner quadrant of left female breast ([place]) 06/08/2012    Priority: High  . Port-A-Cath in place 08/05/2019  . 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 Mediastinal adenopathy   . H/O coccidioidomycosis 05/16/2018  . DOE (dyspnea on exertion) 05/03/2018  . Diarrhea 04/22/2018  . Cavitary lesion of lung in  area of previous cyts in apex of Sup Segment of LLL 03/29/2018  . Aortic atherosclerosis ([place]) 01/22/2018  . Emphysema lung ([place]) 01/22/2018  . CAP (community acquired pneumonia) 11/23/2017  . Metastasis to brain ([place]) 08/04/2017  . Brain metastasis ([place]) 07/27/2017  . Nicotine abuse 07/27/2017  . Migraines 07/27/2017  . Malignant tumor of breast ([place]) 06/12/2017  . HCAP (healthcare-associated pneumonia) 11/26/2016  . Upper airway cough syndrome 08/10/2016  . Abnormal echocardiogram 08/07/2013  . Chest tightness or pressure 08/07/2013  . Hx of radiation therapy   . Edema of left lower extremity 11/19/2012  . Tachycardia 09/20/2012  . Obstructive bronchiectasis ([place]) 01/26/2011  . Cigarette smoker 01/26/2011    is allergic to aspirin; protonix [pantoprazole]; and iodinated diagnostic agents.  MEDICAL HISTORY: Past Medical History:  Diagnosis Date  . Anemia   . Arthritis    knees and hips  . Asthma   . Breast cancer ([place])   . Bronchiectasis ([place])   . Bronchiolitis   . Cancer [facility])    breast cancer 2014  . Complication of anesthesia    bp dropped + desat   . COPD (chronic obstructive pulmonary disease) ([place])   . Dyspnea    DOE  . GERD (gastroesophageal reflux disease)   . H/O coccidioidomycosis    was reason for lung lobectomy  . Headache(784.0)    due to eye strain or not eating  . History of anemia    no current problem  . History of asthma    as a child  . History of breast cancer 2014   left  . History of chemotherapy    finished 07/17/2013  . History of hiatal hernia    AGE 65  . Hx of radiation therapy 03/25/13-05/06/13   left breast 5000 cGy/25 sessions, left breast boost 1000 cGy/5 sessions  . Pneumonia    LAST FLARE UP 01/2018  . Runny nose 07/30/2013   clear drainage  . Wears dentures    upper    SURGICAL HISTORY: Past Surgical History:  Procedure Laterality Date  . APPLICATION OF CRANIAL NAVIGATION N/A 08/04/2017   Procedure: APPLICATION OF  CRANIAL NAVIGATION;  Surgeon: [name], [name], MD;  Location: [place];  Service: Neurosurgery;  Laterality: N/A;  . AXILLARY LYMPH NODE DISSECTION Left 02/08/2013   Procedure: LEFT AXILLARY DISSECTION;  Surgeon: Edward Jolly, MD;  Location: [place];  Service: General;  Laterality: Left;  . BREAST CYST EXCISION Right 2006  . BREAST LUMPECTOMY Left 2014  . BREAST LUMPECTOMY WITH NEEDLE LOCALIZATION AND AXILLARY SENTINEL LYMPH NODE BX Left 12/31/2012   Procedure: NEEDLE LOCALIZATION LEFT BREAST LUMPECTOMY AND LEFT AXILLARY SENTENIAL LYMPH NODE BX;  Surgeon: Edward Jolly, MD;  Location: [place];  Service: General;  Laterality: Left;  . CESAREAN SECTION  1995/1996  . CRANIOTOMY Right 08/04/2017   Procedure: Right Frontal craniotomy for resection of tumor with stereotactic navigation;  Surgeon: [name], [name], MD;  Location: [place];  Service: Neurosurgery;  Laterality: Right;  Right Frontal craniotomy for resection of tumor with stereotactic navigation  . IR IMAGING GUIDED PORT INSERTION  05/31/2019  .  LUNG LOBECTOMY Left 05/1996   upper lobe - due to [facility] Fever  . PORT-A-CATH REMOVAL Right 08/02/2013   Procedure: REMOVAL PORT-A-CATH;  Surgeon: Edward Jolly, MD;  Location: [place];  Service: General;  Laterality: Right;  . PORTACATH PLACEMENT  07/02/2012   Procedure: INSERTION PORT-A-CATH;  Surgeon: Edward Jolly, MD;  Location: [place];  Service: General;  Laterality: N/A;  right  . VIDEO BRONCHOSCOPY WITH ENDOBRONCHIAL ULTRASOUND N/A 05/23/2018   Procedure: VIDEO BRONCHOSCOPY WITH ENDOBRONCHIAL ULTRASOUND;  Surgeon: Garner Nash, DO;  Location: [place] [facility];  Service: Thoracic;  Laterality: N/A;    SOCIAL HISTORY: Social History   Socioeconomic History  . Marital status: Married    Spouse name: Not on file  . Number of children: 2  . Years of education: Not on file  . Highest education  level: Not on file  Occupational History  . Occupation: Secondary school teacher  Tobacco Use  . Smoking status: Current Every Day Smoker    Packs/day: 0.75    Years: 24.00    Pack years: 18.00    Types: Cigarettes  . Smokeless tobacco: Never Used  Substance and Sexual Activity  . Alcohol use: No  . Drug use: No  . Sexual activity: Not Currently  Other Topics Concern  . Not on file  Social History Narrative  . Not on file   Social Determinants of Health   Financial Resource Strain:   . Difficulty of Paying Living Expenses:   Food Insecurity:   . Worried About Charity fundraiser in the Last Year:   . Arboriculturist in the Last Year:   Transportation Needs:   . Film/video editor (Medical):   Marland Kitchen Lack of Transportation (Non-Medical):   Physical Activity:   . Days of Exercise per Week:   . Minutes of Exercise per Session:   Stress:   . Feeling of Stress :   Social Connections:   . Frequency of Communication with Friends and Family:   . Frequency of Social Gatherings with Friends and Family:   . Attends Religious Services:   . Active Member of Clubs or Organizations:   . Attends Archivist Meetings:   Marland Kitchen Marital Status:   Intimate Partner Violence:   . Fear of Current or Ex-Partner:   . Emotionally Abused:   Marland Kitchen Physically Abused:   . Sexually Abused:     FAMILY HISTORY: Family History  Problem Relation Age of Onset  . Emphysema Mother        was a smoker  . Heart disease Mother   . Melanoma Mother        dx in her 79s  . Breast cancer Mother 64  . Asthma Brother   . Breast cancer Cousin        mother's maternal cousin; dx in her 45s    Review of Systems  Constitutional: Positive for fatigue (only on day of treatment). Negative for appetite change, chills and unexpected weight change.  HENT:   Negative for hearing loss and lump/mass.   Eyes: Negative for eye problems and icterus.  Respiratory: Positive for cough. Negative for chest tightness, shortness  of breath and wheezing.   Cardiovascular: Negative for chest pain, leg swelling and palpitations.  Gastrointestinal: Negative for abdominal distention, abdominal pain, constipation, diarrhea, nausea and vomiting.  Endocrine: Negative for hot flashes.  Genitourinary: Negative for difficulty urinating.   Musculoskeletal: Negative for arthralgias.  Skin: Negative for itching and  rash.  Neurological: Negative for dizziness, extremity weakness, headaches and numbness.  Hematological: Negative for adenopathy. Does not bruise/bleed easily.  Psychiatric/Behavioral: Negative for depression. The patient is not nervous/anxious.       PHYSICAL EXAMINATION  ECOG PERFORMANCE STATUS: 1 - Symptomatic but completely ambulatory  Vitals:   10/08/19 0918  BP: 129/67  Pulse: 82  Resp: 18  Temp: 98.5 F (36.9 C)  SpO2: 100%    Physical Exam Constitutional:      General: She is not in acute distress.    Appearance: Normal appearance. She is not toxic-appearing.  HENT:     Head: Normocephalic and atraumatic.  Eyes:     General: No scleral icterus.    Pupils: Pupils are equal, round, and reactive to light.  Cardiovascular:     Rate and Rhythm: Normal rate and regular rhythm.     Pulses: Normal pulses.     Heart sounds: Normal heart sounds.  Pulmonary:     Effort: Pulmonary effort is normal.     Breath sounds: Normal breath sounds.     Comments: Left breast s/p lumpectomy, no sign of local recurrence, right breast benign Abdominal:     General: Abdomen is flat. Bowel sounds are normal.     Palpations: Abdomen is soft.  Musculoskeletal:        General: No swelling.     Cervical back: Neck supple.  Lymphadenopathy:     Cervical: No cervical adenopathy.  Skin:    General: Skin is warm and dry.     Capillary Refill: Capillary refill takes less than 2 seconds.     Findings: No rash.  Neurological:     General: No focal deficit present.     Mental Status: She is alert.  Psychiatric:         Mood and Affect: Mood normal.        Behavior: Behavior normal.     LABORATORY DATA:  CBC    Component Value Date/Time   WBC 8.4 10/08/2019 0910   RBC 3.09 (L) 10/08/2019 0910   HGB 8.2 (L) 10/08/2019 0910   HGB 10.3 (L) 05/14/2019 0917   HGB 13.1 08/12/2014 0955   HCT 26.6 (L) 10/08/2019 0910   HCT 40.4 08/12/2014 0955   PLT 420 (H) 10/08/2019 0910   PLT 425 (H) 05/14/2019 0917   PLT 228 08/12/2014 0955   MCV 86.1 10/08/2019 0910   MCV 98.2 08/12/2014 0955   MCH 26.5 10/08/2019 0910   MCHC 30.8 10/08/2019 0910   RDW 19.0 (H) 10/08/2019 0910   RDW 13.0 08/12/2014 0955   LYMPHSABS 1.7 10/08/2019 0910   LYMPHSABS 2.0 08/12/2014 0955   MONOABS 0.7 10/08/2019 0910   MONOABS 0.5 08/12/2014 0955   EOSABS 0.7 (H) 10/08/2019 0910   EOSABS 0.2 08/12/2014 0955   BASOSABS 0.1 10/08/2019 0910   BASOSABS 0.0 08/12/2014 0955    CMP     Component Value Date/Time   NA 140 10/08/2019 0910   NA 142 08/12/2014 0956   K 3.9 10/08/2019 0910   K 4.6 08/12/2014 0956   CL 106 10/08/2019 0910   CL 100 12/20/2012 1509   CO2 25 10/08/2019 0910   CO2 26 08/12/2014 0956   GLUCOSE 95 10/08/2019 0910   GLUCOSE 94 08/12/2014 0956   GLUCOSE 98 12/20/2012 1509   BUN 8 10/08/2019 0910   BUN 10.1 08/12/2014 0956   CREATININE 0.90 10/08/2019 0910   CREATININE 1.01 (H) 05/14/2019 0917   CREATININE 0.88 03/11/2019 1057  CREATININE 0.9 08/12/2014 0956   CALCIUM 9.3 10/08/2019 0910   CALCIUM 9.3 08/12/2014 0956   PROT 7.8 10/08/2019 0910   PROT 7.1 08/12/2014 0956   ALBUMIN 3.4 (L) 10/08/2019 0910   ALBUMIN 3.8 08/12/2014 0956   AST 18 10/08/2019 0910   AST 13 (L) 05/14/2019 0917   AST 15 08/12/2014 0956   ALT 12 10/08/2019 0910   ALT 9 05/14/2019 0917   ALT 11 08/12/2014 0956   ALKPHOS 100 10/08/2019 0910   ALKPHOS 68 08/12/2014 0956   BILITOT <0.2 (L) 10/08/2019 0910   BILITOT <0.2 (L) 05/14/2019 0917   BILITOT 0.25 08/12/2014 0956   GFRNONAA >60 10/08/2019 0910   GFRNONAA >60  05/14/2019 0917   GFRNONAA 76 03/11/2019 1057   GFRAA >60 10/08/2019 0910   GFRAA >60 05/14/2019 0917   GFRAA 88 03/11/2019 1057            ASSESSMENT and THERAPY PLAN:   Breast cancer of upper-inner quadrant of left female breast ([place]) Left breast invasive ductal carcinoma ER/PR positive HER-2 positive initially 3.1 cm, Ki-67 70%, HER-2 amplified ratio 2.91 status post neoadjuvant chemotherapy followed by surgery which showed 1.8 cm tumor 1 positive sentinel lymph node T1cN1 M0 stage IB status post radiation therapy and Herceptin maintenanceand tooktamoxifen 06/05/2013-08/11/2017  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 Anti Her 2 therapy with Lapatinibstarted 09/17/2017-05/14/2019: Stopped for progression 3.I discontinuedtamoxifen and started her on letrozole 2.5 mg daily.05/14/2019 stopped for progression 4.Stereotactic radiosurgery 12/19/2017 to the new right parietal lobe metastases. -------------------------------------------------------------------------------------------------------------------- Liver Biopsy 06/05/19: Metastatic cancer, ER/PR: 0%, Her 2: 3+ Positive, Ki 67: 20% Patient has metastases to liver, bone, brain, and questionably lung  Current treatment: Kadcyla every 3 weeks, Most recent imaging: CT of the chest 07/19/2019 done for aspergillosis showed improvement in the sternal lesion as well as in the lymph nodes. Echo on 09/25/2019 shows EF of 60-65%   Kadcyla toxicities: 1.Diarrhea: Well controlled 2.Chronic mild peripheral neuropathy: Stable 3.  Normocytic anemia: ? Treatment related, following  Bone metastases: she has not started Xgeva. We are awaiting dental clearance and extractions as she has several broken teeth.    She needs restaging after this cycle.  I placed orders 3 weeks ago, these have not been scheduled.  My nurse Porsche is following up  on this.    We will see Beaux back in 3 weeks for labs, f/u, and her next treatment.     All questions were answered. The patient knows to call the clinic with any problems, questions or concerns. We can certainly see the patient much sooner if necessary.  Total encounter time: 30 minutes*  Gardenia Phlegm, [place] [phone]  *Total Encounter Time as defined by the Centers for Medicare and Medicaid Services includes, in addition to the face-to-face time of a patient visit (documented in the note above) non-face-to-face time: obtaining and reviewing outside history, ordering and reviewing medications, tests or procedures, care coordination (communications with other health care professionals or caregivers) and documentation in the medical record.

## 2019-10-07 NOTE — Assessment & Plan Note (Addendum)
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 Anti Her 2 therapy with Lapatinibstarted 09/17/2017-05/14/2019: Stopped for progression 3.I discontinuedtamoxifen and started her on letrozole 2.5 mg daily.05/14/2019 stopped for progression 4.Stereotactic radiosurgery 12/19/2017 to the new right parietal lobe metastases. -------------------------------------------------------------------------------------------------------------------- Liver Biopsy 06/05/19: Metastatic cancer, ER/PR: 0%, Her 2: 3+ Positive, Ki 67: 20% Patient has metastases to liver, bone, brain, and questionably lung  Current treatment: Kadcyla every 3 weeks, Most recent imaging: CT of the chest 07/19/2019 done for aspergillosis showed improvement in the sternal lesion as well as in the lymph nodes. Echo on 09/25/2019 shows EF of 60-65%   Kadcyla toxicities: 1.Diarrhea: Well controlled 2.Chronic mild peripheral neuropathy: Stable 3.  Normocytic anemia: ? Treatment related, following  Bone metastases: she has not started Xgeva. We are awaiting dental clearance and extractions as she has several broken teeth.    She needs restaging after this cycle.  I placed orders 3 weeks ago, these have not been scheduled.  My nurse Porsche is following up on this.    We will see Rand back in 3 weeks for labs, f/u, and her next treatment.

## 2019-10-08 ENCOUNTER — Encounter: Payer: Self-pay | Admitting: Adult Health

## 2019-10-08 ENCOUNTER — Inpatient Hospital Stay: Payer: PRIVATE HEALTH INSURANCE

## 2019-10-08 ENCOUNTER — Other Ambulatory Visit: Payer: Self-pay

## 2019-10-08 ENCOUNTER — Inpatient Hospital Stay (HOSPITAL_BASED_OUTPATIENT_CLINIC_OR_DEPARTMENT_OTHER): Payer: PRIVATE HEALTH INSURANCE | Admitting: Adult Health

## 2019-10-08 ENCOUNTER — Telehealth: Payer: Self-pay | Admitting: Adult Health

## 2019-10-08 VITALS — BP 129/67 | HR 82 | Temp 98.5°F | Resp 18 | Ht 60.2 in | Wt 118.4 lb

## 2019-10-08 VITALS — BP 103/59 | HR 72 | Resp 18

## 2019-10-08 DIAGNOSIS — C50212 Malignant neoplasm of upper-inner quadrant of left female breast: Secondary | ICD-10-CM

## 2019-10-08 DIAGNOSIS — Z171 Estrogen receptor negative status [ER-]: Secondary | ICD-10-CM

## 2019-10-08 DIAGNOSIS — Z5112 Encounter for antineoplastic immunotherapy: Secondary | ICD-10-CM | POA: Diagnosis not present

## 2019-10-08 DIAGNOSIS — C50912 Malignant neoplasm of unspecified site of left female breast: Secondary | ICD-10-CM

## 2019-10-08 DIAGNOSIS — Z95828 Presence of other vascular implants and grafts: Secondary | ICD-10-CM

## 2019-10-08 LAB — COMPREHENSIVE METABOLIC PANEL
ALT: 12 U/L (ref 0–44)
AST: 18 U/L (ref 15–41)
Albumin: 3.4 g/dL — ABNORMAL LOW (ref 3.5–5.0)
Alkaline Phosphatase: 100 U/L (ref 38–126)
Anion gap: 9 (ref 5–15)
BUN: 8 mg/dL (ref 6–20)
CO2: 25 mmol/L (ref 22–32)
Calcium: 9.3 mg/dL (ref 8.9–10.3)
Chloride: 106 mmol/L (ref 98–111)
Creatinine, Ser: 0.9 mg/dL (ref 0.44–1.00)
GFR calc Af Amer: >60 mL/min (ref >60)
GFR calc non Af Amer: >60 mL/min (ref >60)
Glucose, Bld: 95 mg/dL (ref 70–99)
Potassium: 3.9 mmol/L (ref 3.5–5.1)
Sodium: 140 mmol/L (ref 135–145)
Total Bilirubin: <0.2 mg/dL — ABNORMAL LOW (ref 0.3–1.2)
Total Protein: 7.8 g/dL (ref 6.5–8.1)

## 2019-10-08 LAB — CBC WITH DIFFERENTIAL/PLATELET
Abs Immature Granulocytes: 0.03 10*3/uL (ref 0.00–0.07)
Basophils Absolute: 0.1 10*3/uL (ref 0.0–0.1)
Basophils Relative: 1 %
Eosinophils Absolute: 0.7 10*3/uL — ABNORMAL HIGH (ref 0.0–0.5)
Eosinophils Relative: 9 %
HCT: 26.6 % — ABNORMAL LOW (ref 36.0–46.0)
Hemoglobin: 8.2 g/dL — ABNORMAL LOW (ref 12.0–15.0)
Immature Granulocytes: 0 %
Lymphocytes Relative: 21 %
Lymphs Abs: 1.7 10*3/uL (ref 0.7–4.0)
MCH: 26.5 pg (ref 26.0–34.0)
MCHC: 30.8 g/dL (ref 30.0–36.0)
MCV: 86.1 fL (ref 80.0–100.0)
Monocytes Absolute: 0.7 10*3/uL (ref 0.1–1.0)
Monocytes Relative: 8 %
Neutro Abs: 5.2 10*3/uL (ref 1.7–7.7)
Neutrophils Relative %: 61 %
Platelets: 420 10*3/uL — ABNORMAL HIGH (ref 150–400)
RBC: 3.09 MIL/uL — ABNORMAL LOW (ref 3.87–5.11)
RDW: 19 % — ABNORMAL HIGH (ref 11.5–15.5)
WBC: 8.4 10*3/uL (ref 4.0–10.5)
nRBC: 0 % (ref 0.0–0.2)

## 2019-10-08 MED ORDER — ACETAMINOPHEN 325 MG PO TABS
650.0000 mg | ORAL_TABLET | Freq: Once | ORAL | Status: AC
  Administered 2019-10-08: 650 mg via ORAL

## 2019-10-08 MED ORDER — SODIUM CHLORIDE 0.9 % IV SOLN
3.6000 mg/kg | Freq: Once | INTRAVENOUS | Status: AC
  Administered 2019-10-08: 180 mg via INTRAVENOUS
  Filled 2019-10-08: qty 9

## 2019-10-08 MED ORDER — DIPHENHYDRAMINE HCL 25 MG PO CAPS
25.0000 mg | ORAL_CAPSULE | Freq: Once | ORAL | Status: AC
  Administered 2019-10-08: 10:00:00 25 mg via ORAL

## 2019-10-08 MED ORDER — DIPHENHYDRAMINE HCL 25 MG PO CAPS
ORAL_CAPSULE | ORAL | Status: AC
  Filled 2019-10-08: qty 2

## 2019-10-08 MED ORDER — SODIUM CHLORIDE 0.9% FLUSH
10.0000 mL | INTRAVENOUS | Status: DC | PRN
  Administered 2019-10-08: 10 mL
  Filled 2019-10-08: qty 10

## 2019-10-08 MED ORDER — HEPARIN SOD (PORK) LOCK FLUSH 100 UNIT/ML IV SOLN
500.0000 [IU] | Freq: Once | INTRAVENOUS | Status: AC | PRN
  Administered 2019-10-08: 12:00:00 500 [IU]
  Filled 2019-10-08: qty 5

## 2019-10-08 MED ORDER — ACETAMINOPHEN 325 MG PO TABS
ORAL_TABLET | ORAL | Status: AC
  Filled 2019-10-08: qty 2

## 2019-10-08 MED ORDER — SODIUM CHLORIDE 0.9 % IV SOLN
Freq: Once | INTRAVENOUS | Status: AC
  Filled 2019-10-08: qty 250

## 2019-10-08 NOTE — Telephone Encounter (Signed)
Scheduled appt per 3/30 los. Pt confirmed new appt dates and times.

## 2019-10-08 NOTE — Patient Instructions (Signed)
[facility] Discharge Instructions for Patients Receiving Chemotherapy  Today you received the following chemotherapy agents kadcyla  To help prevent nausea and vomiting after your treatment, we encourage you to take your nausea medication as directed   If you develop nausea and vomiting that is not controlled by your nausea medication, call the clinic.   BELOW ARE SYMPTOMS THAT SHOULD BE REPORTED IMMEDIATELY:  *FEVER GREATER THAN 100.5 F  *CHILLS WITH OR WITHOUT FEVER  NAUSEA AND VOMITING THAT IS NOT CONTROLLED WITH YOUR NAUSEA MEDICATION  *UNUSUAL SHORTNESS OF BREATH  *UNUSUAL BRUISING OR BLEEDING  TENDERNESS IN MOUTH AND THROAT WITH OR WITHOUT PRESENCE OF ULCERS  *URINARY PROBLEMS  *BOWEL PROBLEMS  UNUSUAL RASH Items with * indicate a potential emergency and should be followed up as soon as possible.  Feel free to call the clinic should you have any questions or concerns. The clinic phone number is [phone].  Please show the CHEMO ALERT CARD at check-in to the Emergency Department and triage nurse.   

## 2019-10-17 ENCOUNTER — Other Ambulatory Visit: Payer: Self-pay | Admitting: Family

## 2019-10-17 NOTE — Progress Notes (Signed)
Pt aware of 13 hour prep prior to CT scan on 4/9.

## 2019-10-18 ENCOUNTER — Other Ambulatory Visit: Payer: Self-pay

## 2019-10-18 ENCOUNTER — Ambulatory Visit (HOSPITAL_COMMUNITY)
Admission: RE | Admit: 2019-10-18 | Discharge: 2019-10-18 | Disposition: A | Discharge status: Home / Self Care | Payer: PRIVATE HEALTH INSURANCE | Source: Ambulatory Visit | Attending: Adult Health | Admitting: Adult Health

## 2019-10-18 DIAGNOSIS — C787 Secondary malignant neoplasm of liver and intrahepatic bile duct: Secondary | ICD-10-CM | POA: Diagnosis present

## 2019-10-18 DIAGNOSIS — Z171 Estrogen receptor negative status [ER-]: Secondary | ICD-10-CM | POA: Insufficient documentation

## 2019-10-18 DIAGNOSIS — C50212 Malignant neoplasm of upper-inner quadrant of left female breast: Secondary | ICD-10-CM | POA: Insufficient documentation

## 2019-10-18 MED ORDER — SODIUM CHLORIDE (PF) 0.9 % IJ SOLN
INTRAMUSCULAR | Status: AC
  Filled 2019-10-18: qty 50

## 2019-10-18 MED ORDER — IOHEXOL 300 MG/ML  SOLN
100.0000 mL | Freq: Once | INTRAMUSCULAR | Status: AC | PRN
  Administered 2019-10-18: 15:00:00 100 mL via INTRAVENOUS

## 2019-10-22 ENCOUNTER — Telehealth: Payer: Self-pay | Admitting: Hematology and Oncology

## 2019-10-22 NOTE — Telephone Encounter (Signed)
I informed the patient that after discussing with radiology Dr. Ilda Foil, the CT scan showed a dramatic improvement in the liver metastases decreasing from 7 cm down to 2.5 cm. With regards to the pancreas head enlargement, no further work-up is being planned because there is no concern for metastatic disease at that location.

## 2019-10-23 ENCOUNTER — Other Ambulatory Visit: Payer: Self-pay

## 2019-10-23 ENCOUNTER — Encounter (HOSPITAL_COMMUNITY)
Admission: RE | Admit: 2019-10-23 | Discharge: 2019-10-23 | Disposition: A | Discharge status: Home / Self Care | Payer: PRIVATE HEALTH INSURANCE | Source: Ambulatory Visit | Attending: Adult Health | Admitting: Adult Health

## 2019-10-23 DIAGNOSIS — C50212 Malignant neoplasm of upper-inner quadrant of left female breast: Secondary | ICD-10-CM | POA: Diagnosis not present

## 2019-10-23 DIAGNOSIS — Z171 Estrogen receptor negative status [ER-]: Secondary | ICD-10-CM | POA: Insufficient documentation

## 2019-10-23 MED ORDER — TECHNETIUM TC 99M MEDRONATE IV KIT
21.5000 | PACK | Freq: Once | INTRAVENOUS | Status: AC | PRN
  Administered 2019-10-23: 09:00:00 21.5 via INTRAVENOUS

## 2019-10-23 NOTE — Progress Notes (Signed)
Pharmacist Chemotherapy Monitoring - Follow Up Assessment    I verify that I have reviewed each item in the below checklist:  . Regimen for the patient is scheduled for the appropriate day and plan matches scheduled date. Marland Kitchen Appropriate non-routine labs are ordered dependent on drug ordered. . If applicable, additional medications reviewed and ordered per protocol based on lifetime cumulative doses and/or treatment regimen.   Plan for follow-up and/or issues identified: No . I-vent associated with next due treatment: No . MD and/or nursing notified: No   Kennith Center, Pharm.D., CPP 10/23/2019@4 :40 PM

## 2019-10-28 NOTE — Progress Notes (Signed)
Patient Care Team: Patient, [name] as PCP - General (General Practice) [name], MD as Referring Physician (Emergency Medicine)  DIAGNOSIS:    ICD-10-CM   1. 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 Malignant neoplasm of upper-inner quadrant of left breast in female, estrogen receptor negative ([place])  C50.212    Z17.1     SUMMARY OF ONCOLOGIC HISTORY: Oncology History  Breast cancer of upper-inner quadrant of left female breast ([place])  06/08/2012 Initial Diagnosis   invasive ductal carcinoma that was ER positive PR positive HER-2/neu positive measuring 3.1 cm by MRI criteria. Ki-67 was 70% HER-2 was amplified with a ratio 2.91   07/12/2012 - 07/17/2013 Neo-Adjuvant Chemotherapy   TCH 6 followed by Herceptin maintenance   12/11/2012 Surgery   Left breast lumpectomy: 1.8 cm tumor 1 positive sentinel node, axillary lymph node dissection 02/08/2013 showed 0/13 lymph nodes   03/25/2013 - 05/06/2013 Radiation Therapy   Adjuvant radiation therapy   06/05/2013 - 07/20/2017 Anti-estrogen oral therapy   Tamoxifen 20 mg daily   07/27/2017 Relapse/Recurrence   MRI Brain: 3.4 x 2.9 x 2.9 cm RIGHT frontal lobe mass with imaging characteristics of solitary metastasis. Extensive vasogenic edema resulting in 9 mm RIGHT to LEFT midline shift. Equivocal very early LEFT ventricle entrapment.    08/04/2017 Surgery   Rt frontal brain resection: Poorly differentiated tumor IHC suggests breast primary ER and PR Positive   08/25/2017 - 09/04/2017 Radiation Therapy   Stereotactic radiation   09/18/2017 -  Anti-estrogen oral therapy   Lapatinib with letrozole   12/18/2017 - 12/19/2017 Radiation Therapy   New right parietal lobe metastases status post [facility]   05/09/2019 Relapse/Recurrence   Interval increase in size of the enhancing nodular left internal mammary soft tissue 2.8 cm.  Redemonstrated enlarged supraclavicular, lower cervical and lower posterior cervical  nodes unchanged.  Interval increase in the bony erosion of the posterior and lateral left third rib, increasing soft tissue lesion eroding the left sternal body 3.1 cm was 2.5 cm.  Bronchiectatic changes   06/19/2019 -  Chemotherapy   The patient had ado-trastuzumab emtansine (KADCYLA) 180 mg in sodium chloride 0.9 % 250 mL chemo infusion, 3.6 mg/kg = 180 mg, Intravenous, Once, 6 of 13 cycles Administration: 180 mg (06/19/2019), 180 mg (07/10/2019), 180 mg (09/17/2019), 180 mg (08/05/2019), 180 mg (08/27/2019), 180 mg (10/08/2019)  for chemotherapy treatment.    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      CHIEF COMPLIANT: Follow-up of metastatic breast cancer  INTERVAL HISTORY: Melissa [name] is a 54 y.o. with above-mentioned history of metastatic breast cancer with brain metastasiss/presection who is currently [place] every 3 weeks. CT CAP on 10/18/19 showed a stable lung nodule and significant reduction of liver metastases from 7.0cm previously to 2.5cm. Bone scan on 10/23/19 showed lesions on the left third rib and midline sternum. She presents to the clinic todayfortreatment.  ALLERGIES:  is allergic to aspirin; protonix [pantoprazole]; and iodinated diagnostic agents.  MEDICATIONS:  Current Outpatient Medications  Medication Sig Dispense Refill  . albuterol (VENTOLIN HFA) 108 (90 BASE) MCG/ACT inhaler Inhale 2 puffs into the lungs every 6 (six) hours as needed. 1 Inhaler 3  . budesonide-formoterol (SYMBICORT) 160-4.5 MCG/ACT inhaler Inhale 2 puffs into  the lungs 2 (two) times daily. 3 Inhaler 3  . CRESEMBA 186 MG CAPS TAKE 2 CAPSULES BY MOUTH EVERY 24 HOURS 56 capsule 0  . levalbuterol (XOPENEX) 0.63 MG/3ML nebulizer solution Take 3 mLs (0.63 mg total) by nebulization every 4 (four) hours as needed for wheezing or shortness of breath. 540 mL 6  . lidocaine-prilocaine (EMLA) cream Apply to affected area once 30 g 3  . Melatonin 5 MG TABS Take 5 mg by mouth at bedtime as needed.    . naproxen sodium (ALEVE) 220 MG tablet Take 440 mg by mouth 2 (two) times daily as needed (headache).     . predniSONE (DELTASONE) 50 MG tablet Take 1 tablet 13 hours, 7 hours, and 1 hour prior to contrast (Patient not taking: Reported on 10/29/2019) 3 tablet 4   No current facility-administered medications for this visit.    PHYSICAL EXAMINATION: ECOG PERFORMANCE STATUS: 1 - Symptomatic but completely ambulatory  Vitals:   10/29/19 1331  BP: 119/61  Pulse: 73  Resp: 18  Temp: 99.1 F (37.3 C)  SpO2: 98%   Filed Weights   10/29/19 1331  Weight: 116 lb 12.8 oz (53 kg)    LABORATORY DATA:  I have reviewed the data as listed CMP Latest Ref Rng & Units 10/08/2019 09/17/2019 08/27/2019  Glucose 70 - 99 mg/dL 95 79 83  BUN 6 - 20 mg/dL _0 Creatinine 0.44 - 1.00 mg/dL 0.90 0.99 0.85  Sodium 135 - 145 mmol/L 140 143 140  Potassium 3.5 - 5.1 mmol/L 3.9 4.5 4.1  Chloride 98 - 111 mmol/L 106 107 105  CO2 22 - 32 mmol/L _1 Calcium 8.9 - 10.3 mg/dL 9.3 9.2 9.3  Total Protein 6.5 - 8.1 g/dL 7.8 7.6 7.7  Total Bilirubin 0.3 - 1.2 mg/dL <0.2(L) <0.2(L) <0.2(L)  Alkaline Phos 38 - 126 U/L 100 99 113  AST 15 - 41 U/L _2 ALT 0 - 44 U/L _3 Lab Results  Component Value Date   WBC 9.1 10/29/2019   HGB 8.1 (L) 10/29/2019   HCT 26.6 (L) 10/29/2019   MCV 85.5 10/29/2019   PLT 417 (H) 10/29/2019   NEUTROABS 5.6 10/29/2019    ASSESSMENT & PLAN:  Breast cancer of upper-inner quadrant of left female breast ([place]) 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 Anti Her 2 therapy with Lapatinibstarted 09/17/2017-05/14/2019: Stopped for progression 3.I discontinuedtamoxifen and started her on letrozole 2.5 mg daily.05/14/2019 stopped for progression 4.Stereotactic radiosurgery 12/19/2017 to the new right parietal lobe metastases. -------------------------------------------------------------------------------------------------------------------- Liver Biopsy 06/05/19: Metastatic cancer, ER/PR: 0%, Her 2: 3+ Positive, Ki 67: 20% Patient has metastases to liver, bone, brain, and questionably lung  Current treatment: Kadcyla every 3 weeks, today is cycle 7 Echo on 09/25/2019 shows EF of 60-65% CT CAP: 10/22/2019: Right hepatic lobe lesion 2.5 x 2.1 cm decreased from  7 x 6.4 cm.  Stable left upper lobe and left lower lobe cavitary lesions.  Stable sclerotic area involving body of sternum. Bone scan 10/23/2019: Skeletal metastases left third rib and midline sternum.  No additional lesions.  Kadcyla toxicities: 1.Diarrhea:No further diarrhea. 2.Chronic mild peripheral neuropathy:Stable 3.Normocytic anemia: ? Treatment related, today's hemoglobin is 8.1.  Patient does not have any symptoms.  Therefore no transfusion needed.  Bone metastases: she has not started Xgeva. We are awaiting dental clearance and extractions as she has several broken teeth.  Because of financial reasons she has not been able to see dental surgeon.  Because there are only 2 areas of bone metastases, we can watch and  monitor without bisphosphonate therapy.  We will continue the current treatment with Kadcyla given the impressive response in the liver.  Brain MRI has been scheduled. Additional scans after 6 more cycles of Kadcyla.   No orders of the defined types were placed in this encounter.  The patient has a good understanding of the overall plan. she agrees with it. she will call with any problems that may develop before the next visit here.  Total time spent: 30 mins including face to face time and time spent for planning, charting and coordination of care  Nicholas Lose, MD 10/29/2019  I, Cloyde Reams Dorshimer, am acting as scribe for Dr. Nicholas Lose.  I have reviewed the above documentation for accuracy and completeness, and I agree with the above.

## 2019-10-29 ENCOUNTER — Other Ambulatory Visit: Payer: Self-pay

## 2019-10-29 ENCOUNTER — Inpatient Hospital Stay: Payer: PRIVATE HEALTH INSURANCE | Attending: Medical

## 2019-10-29 ENCOUNTER — Encounter: Payer: Self-pay | Admitting: Hematology and Oncology

## 2019-10-29 ENCOUNTER — Inpatient Hospital Stay: Payer: PRIVATE HEALTH INSURANCE

## 2019-10-29 ENCOUNTER — Inpatient Hospital Stay (HOSPITAL_BASED_OUTPATIENT_CLINIC_OR_DEPARTMENT_OTHER): Payer: PRIVATE HEALTH INSURANCE | Admitting: Hematology and Oncology

## 2019-10-29 VITALS — BP 107/50 | HR 77 | Temp 98.3°F | Resp 16

## 2019-10-29 VITALS — BP 119/61 | HR 73 | Temp 99.1°F | Resp 18 | Ht 62.0 in | Wt 116.8 lb

## 2019-10-29 DIAGNOSIS — C50912 Malignant neoplasm of unspecified site of left female breast: Secondary | ICD-10-CM

## 2019-10-29 DIAGNOSIS — Z171 Estrogen receptor negative status [ER-]: Secondary | ICD-10-CM | POA: Diagnosis not present

## 2019-10-29 DIAGNOSIS — Z5112 Encounter for antineoplastic immunotherapy: Secondary | ICD-10-CM | POA: Insufficient documentation

## 2019-10-29 DIAGNOSIS — C7931 Secondary malignant neoplasm of brain: Secondary | ICD-10-CM

## 2019-10-29 DIAGNOSIS — C787 Secondary malignant neoplasm of liver and intrahepatic bile duct: Secondary | ICD-10-CM | POA: Insufficient documentation

## 2019-10-29 DIAGNOSIS — C50212 Malignant neoplasm of upper-inner quadrant of left female breast: Secondary | ICD-10-CM

## 2019-10-29 DIAGNOSIS — C7951 Secondary malignant neoplasm of bone: Secondary | ICD-10-CM | POA: Insufficient documentation

## 2019-10-29 DIAGNOSIS — Z95828 Presence of other vascular implants and grafts: Secondary | ICD-10-CM

## 2019-10-29 LAB — COMPREHENSIVE METABOLIC PANEL
ALT: 12 U/L (ref 0–44)
AST: 16 U/L (ref 15–41)
Albumin: 3.3 g/dL — ABNORMAL LOW (ref 3.5–5.0)
Alkaline Phosphatase: 98 U/L (ref 38–126)
Anion gap: 9 (ref 5–15)
BUN: 7 mg/dL (ref 6–20)
CO2: 24 mmol/L (ref 22–32)
Calcium: 9.2 mg/dL (ref 8.9–10.3)
Chloride: 104 mmol/L (ref 98–111)
Creatinine, Ser: 1 mg/dL (ref 0.44–1.00)
GFR calc Af Amer: >60 mL/min (ref >60)
GFR calc non Af Amer: >60 mL/min (ref >60)
Glucose, Bld: 110 mg/dL — ABNORMAL HIGH (ref 70–99)
Potassium: 3.6 mmol/L (ref 3.5–5.1)
Sodium: 137 mmol/L (ref 135–145)
Total Bilirubin: <0.2 mg/dL — ABNORMAL LOW (ref 0.3–1.2)
Total Protein: 7.7 g/dL (ref 6.5–8.1)

## 2019-10-29 LAB — CBC WITH DIFFERENTIAL/PLATELET
Abs Immature Granulocytes: 0.03 10*3/uL (ref 0.00–0.07)
Basophils Absolute: 0.1 10*3/uL (ref 0.0–0.1)
Basophils Relative: 1 %
Eosinophils Absolute: 0.5 10*3/uL (ref 0.0–0.5)
Eosinophils Relative: 6 %
HCT: 26.6 % — ABNORMAL LOW (ref 36.0–46.0)
Hemoglobin: 8.1 g/dL — ABNORMAL LOW (ref 12.0–15.0)
Immature Granulocytes: 0 %
Lymphocytes Relative: 26 %
Lymphs Abs: 2.4 10*3/uL (ref 0.7–4.0)
MCH: 26 pg (ref 26.0–34.0)
MCHC: 30.5 g/dL (ref 30.0–36.0)
MCV: 85.5 fL (ref 80.0–100.0)
Monocytes Absolute: 0.6 10*3/uL (ref 0.1–1.0)
Monocytes Relative: 6 %
Neutro Abs: 5.6 10*3/uL (ref 1.7–7.7)
Neutrophils Relative %: 61 %
Platelets: 417 10*3/uL — ABNORMAL HIGH (ref 150–400)
RBC: 3.11 MIL/uL — ABNORMAL LOW (ref 3.87–5.11)
RDW: 19.4 % — ABNORMAL HIGH (ref 11.5–15.5)
WBC: 9.1 10*3/uL (ref 4.0–10.5)
nRBC: 0 % (ref 0.0–0.2)

## 2019-10-29 MED ORDER — SODIUM CHLORIDE 0.9 % IV SOLN
Freq: Once | INTRAVENOUS | Status: AC
  Filled 2019-10-29: qty 250

## 2019-10-29 MED ORDER — SODIUM CHLORIDE 0.9% FLUSH
10.0000 mL | INTRAVENOUS | Status: DC | PRN
  Administered 2019-10-29: 16:00:00 10 mL
  Filled 2019-10-29: qty 10

## 2019-10-29 MED ORDER — SODIUM CHLORIDE 0.9% FLUSH
10.0000 mL | INTRAVENOUS | Status: DC | PRN
  Administered 2019-10-29: 10 mL
  Filled 2019-10-29: qty 10

## 2019-10-29 MED ORDER — SODIUM CHLORIDE 0.9 % IV SOLN
3.6000 mg/kg | Freq: Once | INTRAVENOUS | Status: AC
  Administered 2019-10-29: 16:00:00 180 mg via INTRAVENOUS
  Filled 2019-10-29: qty 9

## 2019-10-29 MED ORDER — HEPARIN SOD (PORK) LOCK FLUSH 100 UNIT/ML IV SOLN
500.0000 [IU] | Freq: Once | INTRAVENOUS | Status: AC | PRN
  Administered 2019-10-29: 16:00:00 500 [IU]
  Filled 2019-10-29: qty 5

## 2019-10-29 MED ORDER — DIPHENHYDRAMINE HCL 25 MG PO CAPS
25.0000 mg | ORAL_CAPSULE | Freq: Once | ORAL | Status: AC
  Administered 2019-10-29: 25 mg via ORAL

## 2019-10-29 MED ORDER — DIPHENHYDRAMINE HCL 25 MG PO CAPS
ORAL_CAPSULE | ORAL | Status: AC
  Filled 2019-10-29: qty 1

## 2019-10-29 MED ORDER — ACETAMINOPHEN 325 MG PO TABS
ORAL_TABLET | ORAL | Status: AC
  Filled 2019-10-29: qty 2

## 2019-10-29 MED ORDER — ACETAMINOPHEN 325 MG PO TABS
650.0000 mg | ORAL_TABLET | Freq: Once | ORAL | Status: AC
  Administered 2019-10-29: 15:00:00 650 mg via ORAL

## 2019-10-29 NOTE — Assessment & Plan Note (Signed)
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 Anti Her 2 therapy with Lapatinibstarted 09/17/2017-05/14/2019: Stopped for progression 3.I discontinuedtamoxifen and started her on letrozole 2.5 mg daily.05/14/2019 stopped for progression 4.Stereotactic radiosurgery 12/19/2017 to the new right parietal lobe metastases. -------------------------------------------------------------------------------------------------------------------- Liver Biopsy 06/05/19: Metastatic cancer, ER/PR: 0%, Her 2: 3+ Positive, Ki 67: 20% Patient has metastases to liver, bone, brain, and questionably lung  Current treatment: Kadcyla every 3 weeks, today is cycle 7 Echo on 09/25/2019 shows EF of 60-65% CT CAP: 10/22/2019: Right hepatic lobe lesion 2.5 x 2.1 cm decreased from 7 x 6.4 cm.  Stable left upper lobe and left lower lobe cavitary lesions.  Stable sclerotic area involving body of sternum. Bone scan 10/23/2019: Skeletal metastases left third rib and midline sternum.  No additional lesions.  Kadcyla toxicities: 1.Diarrhea:Well controlled 2.Chronic mild peripheral neuropathy:Stable 3.Normocytic anemia: ? Treatment related, following  Bone metastases: she has not started Xgeva. We are awaiting dental clearance and extractions as she has several broken teeth.   We will continue the current treatment with Kadcyla given the impressive response in the liver.

## 2019-10-29 NOTE — Progress Notes (Signed)
Pt declined to stay 30 minutes post- Kadcyla for observation due to conflict with another appt. Discharged with stable vital signs.

## 2019-10-29 NOTE — Patient Instructions (Signed)
[facility] Discharge Instructions for Patients Receiving Chemotherapy  Today you received the following chemotherapy agents Kadcyla  To help prevent nausea and vomiting after your treatment, we encourage you to take your nausea medication as directed   If you develop nausea and vomiting that is not controlled by your nausea medication, call the clinic.   BELOW ARE SYMPTOMS THAT SHOULD BE REPORTED IMMEDIATELY:  *FEVER GREATER THAN 100.5 F  *CHILLS WITH OR WITHOUT FEVER  NAUSEA AND VOMITING THAT IS NOT CONTROLLED WITH YOUR NAUSEA MEDICATION  *UNUSUAL SHORTNESS OF BREATH  *UNUSUAL BRUISING OR BLEEDING  TENDERNESS IN MOUTH AND THROAT WITH OR WITHOUT PRESENCE OF ULCERS  *URINARY PROBLEMS  *BOWEL PROBLEMS  UNUSUAL RASH Items with * indicate a potential emergency and should be followed up as soon as possible.  Feel free to call the clinic should you have any questions or concerns. The clinic phone number is [phone].  Please show the CHEMO ALERT CARD at check-in to the Emergency Department and triage nurse.   

## 2019-10-30 ENCOUNTER — Ambulatory Visit (HOSPITAL_COMMUNITY): Payer: PRIVATE HEALTH INSURANCE

## 2019-11-04 ENCOUNTER — Ambulatory Visit (HOSPITAL_COMMUNITY)
Admission: RE | Admit: 2019-11-04 | Discharge: 2019-11-04 | Disposition: A | Discharge status: Home / Self Care | Payer: PRIVATE HEALTH INSURANCE | Source: Ambulatory Visit | Attending: Radiation Oncology | Admitting: Radiation Oncology

## 2019-11-04 ENCOUNTER — Other Ambulatory Visit: Payer: Self-pay

## 2019-11-04 DIAGNOSIS — C7931 Secondary malignant neoplasm of brain: Secondary | ICD-10-CM | POA: Insufficient documentation

## 2019-11-04 MED ORDER — GADOBUTROL 1 MMOL/ML IV SOLN
5.0000 mL | Freq: Once | INTRAVENOUS | Status: AC | PRN
  Administered 2019-11-04: 5 mL via INTRAVENOUS

## 2019-11-05 ENCOUNTER — Telehealth: Payer: Self-pay

## 2019-11-05 NOTE — Telephone Encounter (Signed)
Reminded patient of follow up appointment on 11/06/19 verbalized she understood this is a telephone encounter for imaging results.

## 2019-11-06 ENCOUNTER — Encounter: Payer: Self-pay | Admitting: Urology

## 2019-11-06 ENCOUNTER — Ambulatory Visit
Admission: RE | Admit: 2019-11-06 | Discharge: 2019-11-06 | Disposition: A | Discharge status: Home / Self Care | Payer: MEDICAID | Source: Ambulatory Visit | Attending: Urology | Admitting: Urology

## 2019-11-06 ENCOUNTER — Other Ambulatory Visit: Payer: Self-pay

## 2019-11-06 ENCOUNTER — Inpatient Hospital Stay: Payer: PRIVATE HEALTH INSURANCE

## 2019-11-06 DIAGNOSIS — C7931 Secondary malignant neoplasm of brain: Secondary | ICD-10-CM

## 2019-11-06 DIAGNOSIS — C50912 Malignant neoplasm of unspecified site of left female breast: Secondary | ICD-10-CM

## 2019-11-06 NOTE — Progress Notes (Signed)
Radiation Oncology         [phone] ________________________________  Name: Melissa [name]: [id number]  Date: 11/06/2019  DOB: December 22, 1965  Post Treatment Note  CC: Patient, [name]  [name], [name], *  Diagnosis:   54 y.o. 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  Interval Since Last Radiation:  7 months 03/25/2019:  SRS brain//PTV 5:  Right frontal 5 mm target was treated to a prescription dose of 20 Gy in a single fraction.   09/04/2018:   SRS Brain// Right Frontal, 2 targets / 20 Gy in 1 fraction PTV3: Ant Rt Frontal 74m 20Gy PTV4: Rt Frontal resection cavity 531m 20Gy  12/18/2017: SRS brain//PTV2: 4 mm Rt Parietal lesion treated to 20 Gy in 1 Fx  08/25/2017, 08/28/2017, 08/30/2017, 09/01/2017, 09/04/2017: PTV1: post op SRS to right frontal lobe resection cavity in 5 fxs  Narrative:  I spoke with the patient to conduct her routine scheduled 3 month posttreatment follow up visit via telephone to spare the patient unnecessary potential exposure in the healthcare setting during the current COVID-19 pandemic.  The patient was notified in advance and gave permission to proceed with this visit format.  In summary, she initially presented to the emergency Department on 07/27/2017 with complaints of headaches ongoing for approximately 5 weeks with associated nausea and vomiting. She was initially treated by her PCP for sinusitis without improvement.  She also has a history of migraine headaches and had [facility] treatment of what she thought was a typical migraine headache given the fact that she had her usual blurry vision and aura preceding the headache. She reports that the headache did improve with treatment but the relief was short-lived as the headache returned the very next day. She followed up with her primary care physician and a CT of the head was  going to be scheduled due to the persistent headaches but in the interim, [facility] severity of the headache with associated nausea and vomiting. The headaches were occuring in the frontal lobe as well as bilateral temporal with occasional radiation to the occipital lobe and associated with blurry vision, decreased appetite, fatigue, nausea and vomiting.  She denied any difficulty with speech, memory, imbalance or focal weakness.  CT Head on admission 07/27/2017 showed a large right frontal lobe mass measuring 3.1 by 2.5 cm with significant surrounding edema with mass effect and right to left midline shift measuring 8 mm. A subsequent Brain MRI showed a 3.4 x 2.9 x 2.9 cm right frontal lobe mass with imaging characteristics of solitary metastasis and extensive vasogenic edema resulting in 9 mm right to left midline shift as well as equivocal very early left ventricle entrapment.   CT Chest, Abdomen, Pelvis on 07/28/2017 for disease staging showed no findings to suggest metastatic breast cancer involving the chest, abdomen, pelvis or osseous structures. 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 Surgical changes involving the left breast and left axilla but no findings for local recurrence or regional adenopathy.  She proceeded with a gross total resection of the frontal lesion on 08/05/17 with Dr. DiCyndy Freezeollowed by adjuvant fractionated postop SRS radiotherapy to the resection cavity in February 2019. Final surgical pathology revealed poorly differentiated adenocarcinoma consistent with metastatic breast cancer, ER/PR positive and HER-2 positive.  She tolerated SRS treatment well and initial post treatment MRI brain on 12/07/17 showed satisfactory appearance of the right anterior frontal lobe post treatment site with expected evolution but unfortunately also showed a single, new 2 - 3 mm lesion in the  right parietal lobe without associated edema or mass-effect.  She elected to undergo salvage SRS treatment to the new lesion in the parietal lobe which was completed on 12/18/17 and tolerated well.  A follow up brain MRI on 05/18/18 showed a stable right frontal resection cavity with stable 4 mm nodular enhancement at the inferior cavity margin but no evidence of new disease.  Unfortunately, the follow up MRI brain scan on 08/23/2018 showed a new 11 mm inferior right frontal lesion with only mild associated edema and no mass-effect.  She reported that she had been having more frequent frontal headaches which were not severe and responded to Aleve and otherwise, had been feeling well.  She elected to proceed with a single fraction of SRS to this new brain metastasis as recommended and this was completed on  09/04/18 and tolerated very well.   She had bronchoscopy with Dr. Valeta Harms in 05/2018 and dx'ed with Aspergillus- ID following (Dr. Graylon Good).  She also continues in routine follow up with Dr. Melvyn Novas in Pulmonology.  Follow up MRI brain scan from 03/08/19 showed continued interval enlargement of the nodular focus with enhancement at the superior margin of the right frontal resection cavity, measuring 5 x 6 mm in diameter compared with 3 x 4.5 mm on the previous study. Two smaller foci of enhancement along the inferior margin were unchanged and there was no evidence of increasing mass effect or edema and no new lesions seen elsewhere within the brain.  She elected to proceed with SRS treatment of the progressive nodular lesion which was completed on 03/25/19 and tolerated well without any acute ill effects.    She developed systemic disease progression, particularly in the chest with increase in size of enhancing nodular left internal mammary soft tissue mass, lymph nodes and left third rib lesion and soft tissue lesion eroding the left sternal body as well as a new, hypermetabolic 6.5 cm liver lesion seen on PET scan from  05/23/2019 despite continuing onlapatinibwith Letrozole. She did have an ultrasound-guided biopsy of the liver mass on 06/05/2019 which confirmed metastatic breast cancer, HER-2 positive, ER/PR negative. Her systemic therapy was changed to [place] beginning on 06/19/2019, under the care and direction of Dr. Lindi Adie which she tolerate very well.  A follow-up CT chest from 07/19/2019 showed a positive response to treatment with decrease in size of the soft tissue mass in the breast as well as bony lesions and adenopathy.  Fortunately, her follow-up MRI brain from 08/02/2019 showed stability of the previously treated disease and no new or progressive findings.  Interval History:   She has remained clinically stable since the time of her last SRS treatment and is currently without complaints.  Her most recent MRI brain scan from 11/04/19 was reviewed in the multidisciplinary conference this morning and felt to be overall stable.  There is a slight change in a previously treated right inferior frontal lesion, measuring 5 mm, previously 4 mm, with a new, separate punctate focus of enhancement just posterior to that. This is such minimal change and when her prior treatment fields were fused with today's scan, it appears that this new area of enhancement was on the field border of GTV3 and would have received a large portion of the delivered  dose.  Therefore, consensus recommendation is to simply monitor this area with routine 3 month follow up brain imaging.  As far as the systemic disease, she was recently evaluated with Dr. Lindi Adie on 10/29/19 with repeat disease staging scans prior to that visit on 10/18/19 which showed a stable lung nodule and significant reduction of liver metastases from 7.0 cm previously to 2.5cm. A bone scan on 10/23/19 showed lesions on the left third rib and midline sternum, stable as compared to previous PET imaging from 05/2019.  She has completed 7 cycles of Kadcycla to date and the plan is to  continue on this systemic therapy with repeat staging scans after an additional 6 cycles.   On review of systems, the patient states that she is doing well overall.  She is without complaints aside from fatigue, particularly the day of systemic therapy, and chronic cough.  She has continued with improved energy levels and has been able to continue to work and has recently been helping her parents move to a retirement facility. She denies headaches, decreased visual or auditory acuity, tinnitus, tremor, focal weakness or seizure activity. She is not having difficulty with speech or word finding.  She denies abdominal pain, N/V or diarrhea.  She continues on the Kadcycla systemic therapy and is tolerating this well. She reports a healthy appetite and is maintaining her weight which she is pleased with.  ALLERGIES:  is allergic to aspirin; protonix [pantoprazole]; and iodinated diagnostic agents.  Meds: Current Outpatient Medications  Medication Sig Dispense Refill  . albuterol (VENTOLIN HFA) 108 (90 BASE) MCG/ACT inhaler Inhale 2 puffs into the lungs every 6 (six) hours as needed. 1 Inhaler 3  . budesonide-formoterol (SYMBICORT) 160-4.5 MCG/ACT inhaler Inhale 2 puffs into the lungs 2 (two) times daily. 3 Inhaler 3  . CRESEMBA 186 MG CAPS TAKE 2 CAPSULES BY MOUTH EVERY 24 HOURS 56 capsule 0  . levalbuterol (XOPENEX) 0.63 MG/3ML nebulizer solution Take 3 mLs (0.63 mg total) by nebulization every 4 (four) hours as needed for wheezing or shortness of breath. 540 mL 6  . lidocaine-prilocaine (EMLA) cream Apply to affected area once 30 g 3  . Melatonin 5 MG TABS Take 5 mg by mouth at bedtime as needed.    . naproxen sodium (ALEVE) 220 MG tablet Take 440 mg by mouth 2 (two) times daily as needed (headache).     . predniSONE (DELTASONE) 50 MG tablet Take 1 tablet 13 hours, 7 hours, and 1 hour prior to contrast (Patient not taking: Reported on 10/29/2019) 3 tablet 4   No current facility-administered  medications for this encounter.    Physical Findings:  vitals were not taken for this visit.   /Unable to assess due to virtual/telephone visit format.  Lab Findings: Lab Results  Component Value Date   WBC 9.1 10/29/2019   HGB 8.1 (L) 10/29/2019   HCT 26.6 (L) 10/29/2019   MCV 85.5 10/29/2019   PLT 417 (H) 10/29/2019     Radiographic Findings: CT Chest W Contrast  Result Date: 10/19/2019 CLINICAL DATA:  Breast cancer staging. EXAM: CT CHEST, ABDOMEN, AND PELVIS WITH CONTRAST TECHNIQUE: Multidetector CT imaging of the chest, abdomen and pelvis was performed following the standard protocol during bolus administration of intravenous contrast. CONTRAST:  167m OMNIPAQUE IOHEXOL 300 MG/ML  SOLN COMPARISON:  July 19, 2019 FINDINGS: CT CHEST FINDINGS Cardiovascular: A right-sided venous Port-A-Cath is in place. No significant vascular findings. Normal heart size. No pericardial effusion. Mediastinum/Nodes: No enlarged mediastinal, hilar, or axillary  lymph nodes. Surgical clips are seen along the left axilla. Lungs/Pleura: Mild areas of scarring and/or atelectasis are seen within the bilateral apices. Multiple stable cavitary lesions are seen within the left upper lobe. A stable 7 mm noncalcified lung nodule is seen within the anterolateral aspect of the right lower lobe (axial CT image 73, CT series number 4). There is no evidence of acute infiltrate, pleural effusion or pneumothorax. Musculoskeletal: A stable sclerotic area is seen involving the body of the sternum. CT ABDOMEN PELVIS FINDINGS Hepatobiliary: An area of patchy heterogeneous low attenuation is seen within the region anterior and medial to the gallbladder fossa. No gallstones, gallbladder wall thickening, or biliary dilatation. Pancreas: The head of the pancreas is enlarged and measures approximately 2.8 cm x 3.2 cm. Spleen: Normal in size without focal abnormality. Adrenals/Urinary Tract: Adrenal glands are unremarkable. Kidneys are  normal, without renal calculi or focal lesions. Prominent bilateral extrarenal pelvis are seen. Bladder is unremarkable. Stomach/Bowel: Stomach is within normal limits. Appendix appears normal. No evidence of bowel wall thickening, distention, or inflammatory changes. Noninflamed diverticula are seen throughout the descending and sigmoid colon. Vascular/Lymphatic: Mild aortic calcification. No enlarged abdominal or pelvic lymph nodes. Reproductive: Uterus and bilateral adnexa are unremarkable. Other: No abdominal wall hernia or abnormality. No abdominopelvic ascites. Musculoskeletal: No acute or significant osseous findings. IMPRESSION: 1. Stable left upper lobe and left lower lobe cavitary lesions. 2. Stable 7 mm noncalcified lung nodule within the right lower lobe. 3. Stable sclerotic area involving the body of the sternum. 4. Area of patchy heterogeneous low attenuation within the region of the anterior and medial to the gallbladder fossa. This may represent focal fatty infiltration. MRI correlation is recommended. 5. Colonic diverticulosis. 6. Mild aortic calcification. Aortic Atherosclerosis (ICD10-I70.0). Electronically Signed   By: Virgina Norfolk M.D.   On: 10/19/2019 19:31   MR Brain W Wo Contrast  Result Date: 11/04/2019 CLINICAL DATA:  Follow-up metastatic breast cancer treated with S RS. Right frontal lesion. Second small enhancing focus in the right frontal lobe. EXAM: MRI HEAD WITHOUT AND WITH CONTRAST TECHNIQUE: Multiplanar, multiecho pulse sequences of the brain and surrounding structures were obtained without and with intravenous contrast. CONTRAST:  20m GADAVIST GADOBUTROL 1 MMOL/ML IV SOLN COMPARISON:  08/02/2019 and previous FINDINGS: Brain: Diffusion imaging does not show any acute or subacute infarction or other cause of restricted diffusion. No abnormality is seen affecting the brainstem or cerebellum. Postsurgical and radiation changes of the right frontal lobe not show any worsening or  progressive change. Small foci of enhancement are stable. No increased mass effect or edema. Separate focus of enhancement at the anterior inferior frontal lobe on the right measures a mm larger, 5 mm compared with 4 mm previously. There also appears to be a separate punctate focus of enhancement just behind that. This is a minimal change and uncertain significance and could be followed on subsequent imaging. No new lesion.  No hydrocephalus or extra-axial collection. Vascular: Major vessels at the base of the brain show flow. Skull and upper cervical spine: Negative Sinuses/Orbits: Clear/normal Other: None IMPRESSION: Right frontal postsurgical and S RS region is stable. Small areas of enhancement are stable without increase in enhancement, mass effect or edema. Right inferior frontal enhancing focus shows slight change. It measures a mm larger at 5 mm compared to 4 mm, and there is a separate punctate focus of enhancement just behind that. This is indeterminate and could be observed on follow-up studies. Electronically Signed   By: MElta Guadeloupe  Shogry M.D.   On: 11/04/2019 15:11   NM Bone Scan Whole Body  Result Date: 10/23/2019 CLINICAL DATA:  Breast cancer with metastasis. EXAM: NUCLEAR MEDICINE WHOLE BODY BONE SCAN TECHNIQUE: Whole body anterior and posterior images were obtained approximately 3 hours after intravenous injection of radiopharmaceutical. RADIOPHARMACEUTICALS:  21.5 mCi Technetium-26mMDP IV COMPARISON:  CT 10/18/2019, PET-CT 05/23/2019 brain MRI 10/07/2017 FINDINGS: Again demonstrated abnormal uptake within the posterior lateral LEFT third rib. Focal uptake in the mid sternum. These lesions were demonstrated on comparison FDG PET scan and were hypermetabolic at that time. No additional foci of metastatic disease are identified. Small focal activity midline frontal bone is favored to relate to frontal craniotomy change seen on comparison MRI. IMPRESSION: 1. Skeletal metastasis in the LEFT third rib  and midline sternum corresponds to metabolic lesions on comparison PET-CT scan. 2. No additional foci of skeletal metastasis identified. Electronically Signed   By: SSuzy BouchardM.D.   On: 10/23/2019 16:50   CT Abdomen Pelvis W Contrast  Addendum Date: 10/22/2019   ADDENDUM REPORT: 10/22/2019 14:24 ADDENDUM: Upon further review and comparison with FDG PET scan of 05/23/2019, there is significant reduction in size of the metastatic lesion in the RIGHT hepatic lobe. Lesion measures 2.5 x 2.1 cm in the RIGHT hepatic lobe adjacent to gallbladder fossa (image 72/2) which is decreased from 7.0 X 6.4 cm on comparison FDG PET scan 05/23/2019. Lesion was intensely hypermetabolic at that time. There is prominence of the pancreatic head which is increased from remote CTs of the chest and is slightly more prominent than most recent PET-CT scan. No focal activity at this location on PET scan of 05/31/2019. Recommend continued attention on follow-up and consider future follow-up with FDG PET-CT scan or MRI. Findings conveyed toGudena MDon 10/22/2019 at14:15. Initial dictation by Dr. HSherrye Payor addendum by Dr. ELeonia Reeves Electronically Signed   By: SSuzy BouchardM.D.   On: 10/22/2019 14:24   Result Date: 10/22/2019 CLINICAL DATA:  Breast cancer staging. EXAM: CT ABDOMEN AND PELVIS WITH CONTRAST TECHNIQUE: Multidetector CT imaging of the abdomen and pelvis was performed using the standard protocol following bolus administration of intravenous contrast. CONTRAST:  1062mOMNIPAQUE IOHEXOL 300 MG/ML  SOLN COMPARISON:  None. FINDINGS: CT CHEST FINDINGS Cardiovascular: A right-sided venous Port-A-Cath is in place. No significant vascular findings. Normal heart size. No pericardial effusion. Mediastinum/Nodes: No enlarged mediastinal, hilar, or axillary lymph nodes. Surgical clips are seen along the left axilla. Lungs/Pleura: Mild areas of scarring and/or atelectasis are seen within the bilateral apices. Multiple stable cavitary  lesions are seen within the left upper lobe. A stable 7 mm noncalcified lung nodule is seen within the anterolateral aspect of the right lower lobe (axial CT image 73, CT series number 4). There is no evidence of acute infiltrate, pleural effusion or pneumothorax. Musculoskeletal: A stable sclerotic area is seen involving the body of the sternum. CT ABDOMEN PELVIS FINDINGS Hepatobiliary: An area of patchy heterogeneous low attenuation is seen within the region anterior and medial to the gallbladder fossa. No gallstones, gallbladder wall thickening, or biliary dilatation. Pancreas: The head of the pancreas is enlarged and measures approximately 2.8 cm x 3.2 cm. Spleen: Normal in size without focal abnormality. Adrenals/Urinary Tract: Adrenal glands are unremarkable. Kidneys are normal, without renal calculi or focal lesions. Prominent bilateral extrarenal pelvis are seen. Bladder is unremarkable. Stomach/Bowel: Stomach is within normal limits. Appendix appears normal. No evidence of bowel wall thickening, distention, or inflammatory changes. Noninflamed diverticula are seen throughout the descending  and sigmoid colon. Vascular/Lymphatic: Mild aortic calcification. No enlarged abdominal or pelvic lymph nodes. Reproductive: Uterus and bilateral adnexa are unremarkable. Other: No abdominal wall hernia or abnormality. No abdominopelvic ascites. Musculoskeletal: No acute or significant osseous findings. IMPRESSION: 1. Stable left upper lobe and left lower lobe cavitary lesions. 2. Stable 7 mm noncalcified lung nodule within the right lower lobe. 3. Stable sclerotic area involving the body of the sternum. 4. Area of patchy heterogeneous low attenuation within the region of the anterior and medial to the gallbladder fossa. This may represent focal fatty infiltration. MRI correlation is recommended. 5. Colonic diverticulosis. 6. Mild aortic calcification. Electronically Signed: By: Virgina Norfolk M.D. On: 10/19/2019 19:35     Impression/Plan: 64. 54 y.o. 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 She has recovered well from her recent [facility] treatment and is currently without CNS complaints.  Her most recent brain MRI from 11/04/19 was reviewed in the multidisciplinary conference this morning and felt to be overall stable.  There is a slight change in a previously treated right inferior frontal lesion, measuring 5 mm, previously 4 mm, with a new, separate punctate focus of enhancement just posterior to that. This is such minimal change and when her prior treatment fields were fused with today's scan, it appears that this new area of enhancement was on the field border of GTV3 and would have received a large portion of the delivered dose.  Therefore, consensus recommendation is to simply monitor this area with routine 3 month follow up brain imaging.  We discussed the plan to proceed with serial brain MRI scans every 3 months with follow up thereafter to review results and recommendations from the brain tumor board.  She will also continue in routine follow up with Dr. Lindi Adie for continued management of her systemic disease. Based on her most recent systemic imaging from 10/18/19, it appears that she is having an excellent response to the [place] treatment with significant reduction in size of the liver metastases and otherwise, stable disease in the lungs and bone. The current plan is to continue her current treatment regimen and repeat restaging imaging after an additional 6 cycles of Kadcycla. She appears to have a good understanding of her disease and our recommendations and is comfortable and in agreement with the current plan as stated.  Given current concerns for patient exposure during the COVID-19 pandemic, this encounter was conducted via telephone. The patient was notified in advance and was offered a [place] meeting to allow for face to  face communication but unfortunately reported that she did not have the appropriate resources/technology to support such a visit and instead preferred to proceed with telephone follow up visit. The patient has given verbal consent for this type of encounter. The time spent during this encounter was 15 minutes with 50% of time spent in coordination of care. The attendants for this meeting include Charlei Ramsaran PA-C, and patient, Michole Lecuyer. During the encounter, Huck Ashworth PA-C was located at [facility] Radiation Oncology Department.  Patient, Sedonia Kitner was located at home.    Nicholos Johns, PA-C

## 2019-11-07 ENCOUNTER — Other Ambulatory Visit: Payer: Self-pay | Admitting: Internal Medicine

## 2019-11-13 NOTE — Progress Notes (Signed)
Pharmacist Chemotherapy Monitoring - Follow Up Assessment    I verify that I have reviewed each item in the below checklist:  . Regimen for the patient is scheduled for the appropriate day and plan matches scheduled date. Marland Kitchen Appropriate non-routine labs are ordered dependent on drug ordered. . If applicable, additional medications reviewed and ordered per protocol based on lifetime cumulative doses and/or treatment regimen.   Plan for follow-up and/or issues identified: No . I-vent associated with next due treatment: No . MD and/or nursing notified: No   Kennith Center, Pharm.D., CPP 11/13/2019@1 :52 PM

## 2019-11-19 ENCOUNTER — Inpatient Hospital Stay: Payer: PRIVATE HEALTH INSURANCE

## 2019-11-19 ENCOUNTER — Other Ambulatory Visit: Payer: Self-pay

## 2019-11-19 ENCOUNTER — Inpatient Hospital Stay (HOSPITAL_BASED_OUTPATIENT_CLINIC_OR_DEPARTMENT_OTHER): Payer: PRIVATE HEALTH INSURANCE | Admitting: Adult Health

## 2019-11-19 ENCOUNTER — Inpatient Hospital Stay: Payer: PRIVATE HEALTH INSURANCE | Attending: Medical

## 2019-11-19 ENCOUNTER — Encounter: Payer: Self-pay | Admitting: Adult Health

## 2019-11-19 VITALS — BP 119/62 | HR 66 | Temp 98.2°F | Resp 17 | Ht 62.0 in | Wt 116.3 lb

## 2019-11-19 DIAGNOSIS — C50212 Malignant neoplasm of upper-inner quadrant of left female breast: Secondary | ICD-10-CM | POA: Diagnosis present

## 2019-11-19 DIAGNOSIS — Z5112 Encounter for antineoplastic immunotherapy: Secondary | ICD-10-CM | POA: Insufficient documentation

## 2019-11-19 DIAGNOSIS — C50912 Malignant neoplasm of unspecified site of left female breast: Secondary | ICD-10-CM

## 2019-11-19 DIAGNOSIS — C7931 Secondary malignant neoplasm of brain: Secondary | ICD-10-CM | POA: Diagnosis not present

## 2019-11-19 DIAGNOSIS — C787 Secondary malignant neoplasm of liver and intrahepatic bile duct: Secondary | ICD-10-CM | POA: Insufficient documentation

## 2019-11-19 DIAGNOSIS — C7951 Secondary malignant neoplasm of bone: Secondary | ICD-10-CM | POA: Diagnosis not present

## 2019-11-19 DIAGNOSIS — Z95828 Presence of other vascular implants and grafts: Secondary | ICD-10-CM

## 2019-11-19 DIAGNOSIS — Z170 Estrogen receptor positive status [ER+]: Secondary | ICD-10-CM | POA: Diagnosis not present

## 2019-11-19 DIAGNOSIS — Z171 Estrogen receptor negative status [ER-]: Secondary | ICD-10-CM | POA: Diagnosis not present

## 2019-11-19 DIAGNOSIS — Z452 Encounter for adjustment and management of vascular access device: Secondary | ICD-10-CM | POA: Diagnosis not present

## 2019-11-19 LAB — COMPREHENSIVE METABOLIC PANEL
ALT: 14 U/L (ref 0–44)
AST: 22 U/L (ref 15–41)
Albumin: 3.7 g/dL (ref 3.5–5.0)
Alkaline Phosphatase: 109 U/L (ref 38–126)
Anion gap: 10 (ref 5–15)
BUN: 9 mg/dL (ref 6–20)
CO2: 25 mmol/L (ref 22–32)
Calcium: 9.9 mg/dL (ref 8.9–10.3)
Chloride: 104 mmol/L (ref 98–111)
Creatinine, Ser: 1.04 mg/dL — ABNORMAL HIGH (ref 0.44–1.00)
GFR calc Af Amer: >60 mL/min (ref >60)
GFR calc non Af Amer: >60 mL/min (ref >60)
Glucose, Bld: 83 mg/dL (ref 70–99)
Potassium: 4.7 mmol/L (ref 3.5–5.1)
Sodium: 139 mmol/L (ref 135–145)
Total Bilirubin: <0.2 mg/dL — ABNORMAL LOW (ref 0.3–1.2)
Total Protein: 8.5 g/dL — ABNORMAL HIGH (ref 6.5–8.1)

## 2019-11-19 LAB — CBC WITH DIFFERENTIAL/PLATELET
Abs Immature Granulocytes: 0.02 10*3/uL (ref 0.00–0.07)
Basophils Absolute: 0.1 10*3/uL (ref 0.0–0.1)
Basophils Relative: 1 %
Eosinophils Absolute: 0.6 10*3/uL — ABNORMAL HIGH (ref 0.0–0.5)
Eosinophils Relative: 8 %
HCT: 29.1 % — ABNORMAL LOW (ref 36.0–46.0)
Hemoglobin: 9 g/dL — ABNORMAL LOW (ref 12.0–15.0)
Immature Granulocytes: 0 %
Lymphocytes Relative: 30 %
Lymphs Abs: 2.3 10*3/uL (ref 0.7–4.0)
MCH: 26.2 pg (ref 26.0–34.0)
MCHC: 30.9 g/dL (ref 30.0–36.0)
MCV: 84.8 fL (ref 80.0–100.0)
Monocytes Absolute: 0.7 10*3/uL (ref 0.1–1.0)
Monocytes Relative: 9 %
Neutro Abs: 4.1 10*3/uL (ref 1.7–7.7)
Neutrophils Relative %: 52 %
Platelets: 404 10*3/uL — ABNORMAL HIGH (ref 150–400)
RBC: 3.43 MIL/uL — ABNORMAL LOW (ref 3.87–5.11)
RDW: 19.8 % — ABNORMAL HIGH (ref 11.5–15.5)
WBC: 7.8 10*3/uL (ref 4.0–10.5)
nRBC: 0 % (ref 0.0–0.2)

## 2019-11-19 MED ORDER — HEPARIN SOD (PORK) LOCK FLUSH 100 UNIT/ML IV SOLN
500.0000 [IU] | Freq: Once | INTRAVENOUS | Status: AC | PRN
  Administered 2019-11-19: 500 [IU]
  Filled 2019-11-19: qty 5

## 2019-11-19 MED ORDER — SODIUM CHLORIDE 0.9 % IV SOLN
Freq: Once | INTRAVENOUS | Status: AC
  Filled 2019-11-19: qty 250

## 2019-11-19 MED ORDER — DIPHENHYDRAMINE HCL 25 MG PO CAPS
ORAL_CAPSULE | ORAL | Status: AC
  Filled 2019-11-19: qty 2

## 2019-11-19 MED ORDER — ACETAMINOPHEN 325 MG PO TABS
650.0000 mg | ORAL_TABLET | Freq: Once | ORAL | Status: AC
  Administered 2019-11-19: 10:00:00 650 mg via ORAL

## 2019-11-19 MED ORDER — SODIUM CHLORIDE 0.9% FLUSH
10.0000 mL | INTRAVENOUS | Status: DC | PRN
  Administered 2019-11-19: 10 mL
  Filled 2019-11-19: qty 10

## 2019-11-19 MED ORDER — SODIUM CHLORIDE 0.9 % IV SOLN
3.6000 mg/kg | Freq: Once | INTRAVENOUS | Status: AC
  Administered 2019-11-19: 180 mg via INTRAVENOUS
  Filled 2019-11-19: qty 9

## 2019-11-19 MED ORDER — ALTEPLASE 2 MG IJ SOLR
2.0000 mg | Freq: Once | INTRAMUSCULAR | Status: AC | PRN
  Administered 2019-11-19: 2 mg
  Filled 2019-11-19: qty 2

## 2019-11-19 MED ORDER — DIPHENHYDRAMINE HCL 25 MG PO CAPS
25.0000 mg | ORAL_CAPSULE | Freq: Once | ORAL | Status: AC
  Administered 2019-11-19: 25 mg via ORAL

## 2019-11-19 MED ORDER — ALTEPLASE 2 MG IJ SOLR
INTRAMUSCULAR | Status: AC
  Filled 2019-11-19: qty 2

## 2019-11-19 MED ORDER — ACETAMINOPHEN 325 MG PO TABS
ORAL_TABLET | ORAL | Status: AC
  Filled 2019-11-19: qty 2

## 2019-11-19 NOTE — Progress Notes (Signed)
[facility] Follow up:    Patient, [name] No address on file   DIAGNOSIS: Cancer Staging Breast cancer of upper-inner quadrant of left female breast ([place]) Staging form: Breast, AJCC 7th Edition - Clinical: Stage IIA (T2, N0, cM0) - Unsigned Staging comments: Staged at breast conference 12.4.13  - Pathologic: Stage IIA (T1c, N1, cM0) - Unsigned   SUMMARY OF ONCOLOGIC HISTORY: Oncology History  Breast cancer of upper-inner quadrant of left female breast ([place])  06/08/2012 Initial Diagnosis   invasive ductal carcinoma that was ER positive PR positive HER-2/neu positive measuring 3.1 cm by MRI criteria. Ki-67 was 70% HER-2 was amplified with a ratio 2.91   07/12/2012 - 07/17/2013 Neo-Adjuvant Chemotherapy   TCH 6 followed by Herceptin maintenance   12/11/2012 Surgery   Left breast lumpectomy: 1.8 cm tumor 1 positive sentinel node, axillary lymph node dissection 02/08/2013 showed 0/13 lymph nodes   03/25/2013 - 05/06/2013 Radiation Therapy   Adjuvant radiation therapy   06/05/2013 - 07/20/2017 Anti-estrogen oral therapy   Tamoxifen 20 mg daily   07/27/2017 Relapse/Recurrence   MRI Brain: 3.4 x 2.9 x 2.9 cm RIGHT frontal lobe mass with imaging characteristics of solitary metastasis. Extensive vasogenic edema resulting in 9 mm RIGHT to LEFT midline shift. Equivocal very early LEFT ventricle entrapment.    08/04/2017 Surgery   Rt frontal brain resection: Poorly differentiated tumor IHC suggests breast primary ER and PR Positive   08/25/2017 - 09/04/2017 Radiation Therapy   Stereotactic radiation   09/18/2017 -  Anti-estrogen oral therapy   Lapatinib with letrozole   12/18/2017 - 12/19/2017 Radiation Therapy   New right parietal lobe metastases status post [facility]   05/09/2019 Relapse/Recurrence   Interval increase in size of the enhancing nodular left internal mammary soft tissue 2.8 cm.  Redemonstrated enlarged supraclavicular, lower cervical and lower posterior  cervical nodes unchanged.  Interval increase in the bony erosion of the posterior and lateral left third rib, increasing soft tissue lesion eroding the left sternal body 3.1 cm was 2.5 cm.  Bronchiectatic changes   06/19/2019 -  Chemotherapy   The patient had ado-trastuzumab emtansine (KADCYLA) 180 mg in sodium chloride 0.9 % 250 mL chemo infusion, 3.6 mg/kg = 180 mg, Intravenous, Once, 7 of 13 cycles Administration: 180 mg (06/19/2019), 180 mg (07/10/2019), 180 mg (09/17/2019), 180 mg (08/05/2019), 180 mg (08/27/2019), 180 mg (10/08/2019), 180 mg (10/29/2019)  for chemotherapy treatment.    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      CURRENT THERAPY: Kadcyla every 3 weeks  INTERVAL HISTORY: Melissa [name] 54 y.o. female returns for evaluation prior to receiving treatment with Kadcyla for her metastatic breast cancer.  Her most recent restaging scans included CT chest, abdomen, pelvis, and bone scan.  These showed improvement in liver metastases and stable disease in the bones.  Her most recent brain mri was completed on 11/04/2019 and was discussed in brain [place] and felt to be stable.  They will f/u in 3 months for repeat imaging.  Her most recent echocardiogram  was completed on 09/25/2019 and showed an EF of 60-65%.  She is awaiting approval for assistance for the dental work that she needs so that she can start xgeva.  She has been tied up with having her parents move into her home with her.     Patient Active Problem List   Diagnosis Date  Noted  . Breast cancer of upper-inner quadrant of left female breast ([place]) 06/08/2012    Priority: High  . Port-A-Cath in place 08/05/2019  . 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 Mediastinal adenopathy   . H/O coccidioidomycosis 05/16/2018  . DOE (dyspnea on exertion) 05/03/2018  . Diarrhea 04/22/2018  . Cavitary lesion of lung in area of previous cyts in apex of Sup Segment of LLL 03/29/2018  . Aortic atherosclerosis ([place]) 01/22/2018  . Emphysema lung ([place]) 01/22/2018  . CAP (community acquired pneumonia) 11/23/2017  . Metastasis to brain ([place]) 08/04/2017  . Nicotine abuse 07/27/2017  . Migraines 07/27/2017  . Malignant tumor of breast ([place]) 06/12/2017  . HCAP (healthcare-associated pneumonia) 11/26/2016  . Upper airway cough syndrome 08/10/2016  . Abnormal echocardiogram 08/07/2013  . Chest tightness or pressure 08/07/2013  . Hx of radiation therapy   . Edema of left lower extremity 11/19/2012  . Tachycardia 09/20/2012  . Obstructive bronchiectasis ([place]) 01/26/2011  . Cigarette smoker 01/26/2011    is allergic to aspirin; protonix [pantoprazole]; and iodinated diagnostic agents.  MEDICAL HISTORY: Past Medical History:  Diagnosis Date  . Anemia   . Arthritis    knees and hips  . Asthma   . Breast cancer ([place])   . Bronchiectasis ([place])   . Bronchiolitis   . Cancer [facility])    breast cancer 2014  . Complication of anesthesia    bp dropped + desat   . COPD (chronic obstructive pulmonary disease) ([place])   . Dyspnea    DOE  . GERD (gastroesophageal reflux disease)   . H/O coccidioidomycosis    was reason for lung lobectomy  . Headache(784.0)    due to eye strain or not eating  . History of anemia    no current problem  . History of asthma    as a child  . History of breast cancer 2014   left  . History of chemotherapy    finished 07/17/2013  . History of hiatal hernia    AGE 89  . Hx of radiation therapy 03/25/13-05/06/13   left breast 5000  cGy/25 sessions, left breast boost 1000 cGy/5 sessions  . Pneumonia    LAST FLARE UP 01/2018  . Runny nose 07/30/2013   clear drainage  . Wears dentures    upper    SURGICAL HISTORY: Past Surgical History:  Procedure Laterality Date  . APPLICATION OF CRANIAL NAVIGATION N/A 08/04/2017   Procedure: APPLICATION OF CRANIAL NAVIGATION;  Surgeon: [name], [name], MD;  Location: [place];  Service: Neurosurgery;  Laterality: N/A;  . AXILLARY LYMPH NODE DISSECTION Left 02/08/2013   Procedure: LEFT AXILLARY DISSECTION;  Surgeon: Edward Jolly, MD;  Location: [place];  Service: General;  Laterality: Left;  . BREAST CYST EXCISION Right 2006  . BREAST LUMPECTOMY Left 2014  . BREAST LUMPECTOMY WITH NEEDLE LOCALIZATION AND AXILLARY SENTINEL LYMPH NODE BX Left 12/31/2012   Procedure: NEEDLE LOCALIZATION LEFT BREAST LUMPECTOMY AND LEFT AXILLARY SENTENIAL LYMPH NODE BX;  Surgeon: Edward Jolly, MD;  Location: [place];  Service: General;  Laterality: Left;  . CESAREAN SECTION  1995/1996  . CRANIOTOMY Right 08/04/2017   Procedure: Right Frontal craniotomy for resection of tumor with stereotactic navigation;  Surgeon: [name], [name], MD;  Location: [place];  Service: Neurosurgery;  Laterality: Right;  Right Frontal craniotomy for resection of tumor with stereotactic navigation  . IR IMAGING GUIDED PORT INSERTION  05/31/2019  . LUNG LOBECTOMY Left 05/1996   upper lobe - due to [facility] Fever  . PORT-A-CATH REMOVAL Right 08/02/2013   Procedure: REMOVAL PORT-A-CATH;  Surgeon: Edward Jolly, MD;  Location: [place];  Service: General;  Laterality: Right;  . PORTACATH PLACEMENT  07/02/2012   Procedure: INSERTION PORT-A-CATH;  Surgeon: Edward Jolly, MD;  Location: [place];  Service: General;  Laterality: N/A;  right  . VIDEO BRONCHOSCOPY WITH ENDOBRONCHIAL ULTRASOUND N/A 05/23/2018   Procedure: VIDEO BRONCHOSCOPY WITH  ENDOBRONCHIAL ULTRASOUND;  Surgeon: Garner Nash, DO;  Location: [place] [facility];  Service: Thoracic;  Laterality: N/A;    SOCIAL HISTORY: Social History   Socioeconomic History  . Marital status: Married    Spouse name: Not on file  . Number of children: 2  . Years of education: Not on file  . Highest education level: Not on file  Occupational History  . Occupation: Secondary school teacher  Tobacco Use  . Smoking status: Current Every Day Smoker    Packs/day: 0.75    Years: 24.00    Pack years: 18.00    Types: Cigarettes  . Smokeless tobacco: Never Used  Substance and Sexual Activity  . Alcohol use: No  . Drug use: No  . Sexual activity: Not Currently  Other Topics Concern  . Not on file  Social History Narrative  . Not on file   Social Determinants of Health   Financial Resource Strain:   . Difficulty of Paying Living Expenses:   Food Insecurity:   . Worried About Charity fundraiser in the Last Year:   . Arboriculturist in the Last Year:   Transportation Needs:   . Film/video editor (Medical):   Marland Kitchen Lack of Transportation (Non-Medical):   Physical Activity:   . Days of Exercise per Week:   . Minutes of Exercise per Session:   Stress:   . Feeling of Stress :   Social Connections:   . Frequency of Communication with Friends and Family:   . Frequency of Social Gatherings with Friends and Family:   . Attends Religious Services:   . Active Member of Clubs or Organizations:   . Attends Archivist Meetings:   Marland Kitchen Marital Status:   Intimate Partner Violence:   . Fear of Current or Ex-Partner:   . Emotionally Abused:   Marland Kitchen Physically Abused:   . Sexually Abused:     FAMILY HISTORY: Family History  Problem Relation Age of Onset  . Emphysema Mother        was a smoker  . Heart disease Mother   . Melanoma Mother        dx in her 18s  . Breast cancer Mother 63  . Asthma Brother   . Breast cancer Cousin        mother's maternal cousin; dx in her 81s    Review  of Systems  Constitutional: Positive for fatigue. Negative for appetite change, chills, fever and unexpected weight change.  HENT:   Negative for hearing loss, lump/mass, sore throat and trouble swallowing.   Eyes: Negative for eye problems and icterus.  Respiratory: Positive for cough (  at baseline) and shortness of breath (at baseline, h/o aspergillus and tobacco use). Negative for chest tightness.   Cardiovascular: Negative for leg swelling and palpitations.  Gastrointestinal: Negative for abdominal distention, abdominal pain, constipation, diarrhea, nausea and vomiting.  Endocrine: Negative for hot flashes.  Genitourinary: Negative for difficulty urinating.   Skin: Negative for itching and rash.  Neurological: Negative for dizziness, extremity weakness, headaches and numbness.  Hematological: Negative for adenopathy. Does not bruise/bleed easily.  Psychiatric/Behavioral: Negative for depression. The patient is not nervous/anxious.       PHYSICAL EXAMINATION  ECOG PERFORMANCE STATUS: 1 - Symptomatic but completely ambulatory  Vitals:   11/19/19 0930  BP: 119/62  Pulse: 66  Resp: 17  Temp: 98.2 F (36.8 C)  SpO2: 100%    Physical Exam Constitutional:      General: She is not in acute distress.    Appearance: Normal appearance. She is not toxic-appearing.  HENT:     Head: Normocephalic and atraumatic.  Eyes:     General: No scleral icterus.    Pupils: Pupils are equal, round, and reactive to light.  Cardiovascular:     Rate and Rhythm: Normal rate and regular rhythm.     Pulses: Normal pulses.     Heart sounds: Normal heart sounds.  Pulmonary:     Effort: Pulmonary effort is normal.     Breath sounds: Normal breath sounds.  Abdominal:     General: Abdomen is flat. Bowel sounds are normal. There is no distension.     Palpations: Abdomen is soft.     Tenderness: There is no abdominal tenderness.  Musculoskeletal:     Cervical back: Neck supple.  Lymphadenopathy:      Cervical: No cervical adenopathy.  Skin:    General: Skin is warm and dry.     Capillary Refill: Capillary refill takes less than 2 seconds.     Findings: No rash.  Neurological:     General: No focal deficit present.     Mental Status: She is alert.  Psychiatric:        Mood and Affect: Mood normal.        Behavior: Behavior normal.     LABORATORY DATA:  CBC    Component Value Date/Time   WBC 7.8 11/19/2019 0906   RBC 3.43 (L) 11/19/2019 0906   HGB 9.0 (L) 11/19/2019 0906   HGB 10.3 (L) 05/14/2019 0917   HGB 13.1 08/12/2014 0955   HCT 29.1 (L) 11/19/2019 0906   HCT 40.4 08/12/2014 0955   PLT 404 (H) 11/19/2019 0906   PLT 425 (H) 05/14/2019 0917   PLT 228 08/12/2014 0955   MCV 84.8 11/19/2019 0906   MCV 98.2 08/12/2014 0955   MCH 26.2 11/19/2019 0906   MCHC 30.9 11/19/2019 0906   RDW 19.8 (H) 11/19/2019 0906   RDW 13.0 08/12/2014 0955   LYMPHSABS 2.3 11/19/2019 0906   LYMPHSABS 2.0 08/12/2014 0955   MONOABS 0.7 11/19/2019 0906   MONOABS 0.5 08/12/2014 0955   EOSABS 0.6 (H) 11/19/2019 0906   EOSABS 0.2 08/12/2014 0955   BASOSABS 0.1 11/19/2019 0906   BASOSABS 0.0 08/12/2014 0955    CMP     Component Value Date/Time   NA 139 11/19/2019 0906   NA 142 08/12/2014 0956   K 4.7 11/19/2019 0906   K 4.6 08/12/2014 0956   CL 104 11/19/2019 0906   CL 100 12/20/2012 1509   CO2 25 11/19/2019 0906   CO2 26 08/12/2014 0956   GLUCOSE  83 11/19/2019 0906   GLUCOSE 94 08/12/2014 0956   GLUCOSE 98 12/20/2012 1509   BUN 9 11/19/2019 0906   BUN 10.1 08/12/2014 0956   CREATININE 1.04 (H) 11/19/2019 0906   CREATININE 1.01 (H) 05/14/2019 0917   CREATININE 0.88 03/11/2019 1057   CREATININE 0.9 08/12/2014 0956   CALCIUM 9.9 11/19/2019 0906   CALCIUM 9.3 08/12/2014 0956   PROT 8.5 (H) 11/19/2019 0906   PROT 7.1 08/12/2014 0956   ALBUMIN 3.7 11/19/2019 0906   ALBUMIN 3.8 08/12/2014 0956   AST 22 11/19/2019 0906   AST 13 (L) 05/14/2019 0917   AST 15 08/12/2014 0956   ALT  14 11/19/2019 0906   ALT 9 05/14/2019 0917   ALT 11 08/12/2014 0956   ALKPHOS 109 11/19/2019 0906   ALKPHOS 68 08/12/2014 0956   BILITOT <0.2 (L) 11/19/2019 0906   BILITOT <0.2 (L) 05/14/2019 0917   BILITOT 0.25 08/12/2014 0956   GFRNONAA >60 11/19/2019 0906   GFRNONAA >60 05/14/2019 0917   GFRNONAA 76 03/11/2019 1057   GFRAA >60 11/19/2019 0906   GFRAA >60 05/14/2019 0917   GFRAA 88 03/11/2019 1057     ASSESSMENT and PLAN:   Breast cancer of upper-inner quadrant of left female breast ([place]) Left breast invasive ductal carcinoma ER/PR positive HER-2 positive initially 3.1 cm, Ki-67 70%, HER-2 amplified ratio 2.91 status post neoadjuvant chemotherapy followed by surgery which showed 1.8 cm tumor 1 positive sentinel lymph node T1cN1 M0 stage IB status post radiation therapy and Herceptin maintenanceand tooktamoxifen 06/05/2013-08/11/2017  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 Anti Her 2 therapy with Lapatinibstarted 09/17/2017-05/14/2019: Stopped for progression 3.I discontinuedtamoxifen and started her on letrozole 2.5 mg daily.05/14/2019 stopped for progression 4.Stereotactic radiosurgery 12/19/2017 to the new right parietal lobe metastases. -------------------------------------------------------------------------------------------------------------------- Liver Biopsy 06/05/19: Metastatic cancer, ER/PR: 0%, Her 2: 3+ Positive, Ki 67: 20% Patient has metastases to liver, bone, brain, and questionably lung  Current treatment: Kadcyla every 3 weeks, today is cycle 7 Echo on 09/25/2019 shows EF of 60-65% CT CAP: 10/22/2019: Right hepatic lobe lesion 2.5 x 2.1 cm decreased from 7 x 6.4 cm.  Stable left upper lobe and left lower lobe cavitary lesions.  Stable sclerotic area involving body of sternum. Bone scan 10/23/2019: Skeletal metastases left third rib and midline sternum.  No additional  lesions. MRI brain 11/04/2019: stable, repeat MRI in 3 months.  Kadcyla toxicities: 1.Diarrhea:Well controlled 2.Chronic mild peripheral neuropathy:Stable 3.Normocytic anemia: ? Treatment related, following  Bone metastases: she has not started Xgeva. We are awaiting dental clearance and extractions as she has several broken teeth.  I placed orders for her next echocardiogram which will be due next month.  We will see her back in 3 weeks for labs, f/u with Dr. Lindi Adie, and her next treatment.    Orders Placed This Encounter  Procedures  . ECHOCARDIOGRAM COMPLETE    Standing Status:   Future    Standing Expiration Date:   02/18/2021    Order Specific Question:   Where should this test be performed    Answer:   [facility]    Order Specific Question:   Perflutren DEFINITY (image enhancing agent) should be administered unless hypersensitivity or allergy exist    Answer:   Administer Perflutren    Order Specific Question:   Does this study need to be read by the Structural team/Level 3 readers?    Answer:   No    Order  Specific Question:   Reason for exam-Echo    Answer:   Chemotherapy evaluation  v87.41 / v58.11    All questions were answered. The patient knows to call the clinic with any problems, questions or concerns. We can certainly see the patient much sooner if necessary.  Total encounter time: 20 minutes*  Wilber Bihari, NP 11/19/19 9:57 AM Medical Oncology and Hematology [facility] [place], [place] [address] Tel. [phone]    Fax. [phone]  *Total Encounter Time as defined by the Centers for Medicare and Medicaid Services includes, in addition to the face-to-face time of a patient visit (documented in the note above) non-face-to-face time: obtaining and reviewing outside history, ordering and reviewing medications, tests or procedures, care coordination (communications with other health care professionals or caregivers) and  documentation in the medical record.

## 2019-11-19 NOTE — Patient Instructions (Signed)
[facility] Discharge Instructions for Patients Receiving Chemotherapy  Today you received the following chemotherapy agents Ado-trastuzumab (KADCYLA).  To help prevent nausea and vomiting after your treatment, we encourage you to take your nausea medication as prescribed.   If you develop nausea and vomiting that is not controlled by your nausea medication, call the clinic.   BELOW ARE SYMPTOMS THAT SHOULD BE REPORTED IMMEDIATELY:  *FEVER GREATER THAN 100.5 F  *CHILLS WITH OR WITHOUT FEVER  NAUSEA AND VOMITING THAT IS NOT CONTROLLED WITH YOUR NAUSEA MEDICATION  *UNUSUAL SHORTNESS OF BREATH  *UNUSUAL BRUISING OR BLEEDING  TENDERNESS IN MOUTH AND THROAT WITH OR WITHOUT PRESENCE OF ULCERS  *URINARY PROBLEMS  *BOWEL PROBLEMS  UNUSUAL RASH Items with * indicate a potential emergency and should be followed up as soon as possible.  Feel free to call the clinic should you have any questions or concerns. The clinic phone number is [phone].  Please show the CHEMO ALERT CARD at check-in to the Emergency Department and triage nurse.   

## 2019-11-19 NOTE — Patient Instructions (Signed)

## 2019-11-19 NOTE — Progress Notes (Signed)
CATHFLO given by Dondra Prader, RN at [phone] I drew blood peripherally.

## 2019-11-19 NOTE — Assessment & Plan Note (Addendum)
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 Anti Her 2 therapy with Lapatinibstarted 09/17/2017-05/14/2019: Stopped for progression 3.I discontinuedtamoxifen and started her on letrozole 2.5 mg daily.05/14/2019 stopped for progression 4.Stereotactic radiosurgery 12/19/2017 to the new right parietal lobe metastases. -------------------------------------------------------------------------------------------------------------------- Liver Biopsy 06/05/19: Metastatic cancer, ER/PR: 0%, Her 2: 3+ Positive, Ki 67: 20% Patient has metastases to liver, bone, brain, and questionably lung  Current treatment: Kadcyla every 3 weeks, today is cycle 7 Echo on 09/25/2019 shows EF of 60-65% CT CAP: 10/22/2019: Right hepatic lobe lesion 2.5 x 2.1 cm decreased from 7 x 6.4 cm.  Stable left upper lobe and left lower lobe cavitary lesions.  Stable sclerotic area involving body of sternum. Bone scan 10/23/2019: Skeletal metastases left third rib and midline sternum.  No additional lesions. MRI brain 11/04/2019: stable, repeat MRI in 3 months.  Kadcyla toxicities: 1.Diarrhea:Well controlled 2.Chronic mild peripheral neuropathy:Stable 3.Normocytic anemia: ? Treatment related, following  Bone metastases: she has not started Xgeva. We are awaiting dental clearance and extractions as she has several broken teeth.  I placed orders for her next echocardiogram which will be due next month.  We will see her back in 3 weeks for labs, f/u with Dr. Lindi Adie, and her next  treatment.

## 2019-11-20 ENCOUNTER — Telehealth: Payer: Self-pay | Admitting: Adult Health

## 2019-11-20 NOTE — Telephone Encounter (Signed)
Made no changes to pt's schedule per 5/11 los.

## 2019-11-25 ENCOUNTER — Ambulatory Visit (HOSPITAL_COMMUNITY)
Admission: RE | Admit: 2019-11-25 | Discharge: 2019-11-25 | Disposition: A | Discharge status: Home / Self Care | Payer: PRIVATE HEALTH INSURANCE | Source: Ambulatory Visit | Attending: Adult Health | Admitting: Adult Health

## 2019-11-25 ENCOUNTER — Other Ambulatory Visit: Payer: Self-pay

## 2019-11-25 DIAGNOSIS — Z171 Estrogen receptor negative status [ER-]: Secondary | ICD-10-CM | POA: Insufficient documentation

## 2019-11-25 DIAGNOSIS — Z01818 Encounter for other preprocedural examination: Secondary | ICD-10-CM | POA: Insufficient documentation

## 2019-11-25 DIAGNOSIS — J449 Chronic obstructive pulmonary disease, unspecified: Secondary | ICD-10-CM | POA: Insufficient documentation

## 2019-11-25 DIAGNOSIS — F17200 Nicotine dependence, unspecified, uncomplicated: Secondary | ICD-10-CM | POA: Insufficient documentation

## 2019-11-25 DIAGNOSIS — C50212 Malignant neoplasm of upper-inner quadrant of left female breast: Secondary | ICD-10-CM | POA: Diagnosis not present

## 2019-11-25 MED ORDER — VIRTUSSIN A/C 100-10 MG/5ML PO SOLN: 5.0000 mL | Freq: Three times a day (TID) | ORAL | 0 refills | Status: DC | PRN

## 2019-11-25 NOTE — Telephone Encounter (Signed)
Patient calling with complaints of waiting for cough medication refill for at least one week per her pharmacy.  I was not able to see a request for this medication.   She states it was a medication given for problems with coughing at night.   She bareley uses the medication which was filled one year ago.   Virtussin A/C called to pharmacy.   Laverle Patter , RN

## 2019-11-25 NOTE — Progress Notes (Signed)
  Echocardiogram 2D Echocardiogram has been performed.  Jannett Celestine 11/25/2019, 11:40 AM

## 2019-12-02 ENCOUNTER — Other Ambulatory Visit: Payer: Self-pay | Admitting: Internal Medicine

## 2019-12-03 NOTE — Progress Notes (Signed)
Pharmacist Chemotherapy Monitoring - Follow Up Assessment    I verify that I have reviewed each item in the below checklist:  . Regimen for the patient is scheduled for the appropriate day and plan matches scheduled date. Marland Kitchen Appropriate non-routine labs are ordered dependent on drug ordered. . If applicable, additional medications reviewed and ordered per protocol based on lifetime cumulative doses and/or treatment regimen.   Plan for follow-up and/or issues identified: No . I-vent associated with next due treatment: No . MD and/or nursing notified: No  Melissa [name] K 12/03/2019 2:48 PM

## 2019-12-09 NOTE — Progress Notes (Signed)
Patient Care Team: Patient, [name] as PCP - General (General Practice) [name], MD as Referring Physician (Emergency Medicine)  DIAGNOSIS:    ICD-10-CM   1. Malignant neoplasm of upper-inner quadrant of left breast in female, estrogen receptor negative ([place])  C50.212    Z17.1     SUMMARY OF ONCOLOGIC HISTORY: Oncology History  Breast cancer of upper-inner quadrant of left female breast ([place])  06/08/2012 Initial Diagnosis   invasive ductal carcinoma that was ER positive PR positive HER-2/neu positive measuring 3.1 cm by MRI criteria. Ki-67 was 70% HER-2 was amplified with a ratio 2.91   07/12/2012 - 07/17/2013 Neo-Adjuvant Chemotherapy   TCH 6 followed by Herceptin maintenance   12/11/2012 Surgery   Left breast lumpectomy: 1.8 cm tumor 1 positive sentinel node, axillary lymph node dissection 02/08/2013 showed 0/13 lymph nodes   03/25/2013 - 05/06/2013 Radiation Therapy   Adjuvant radiation therapy   06/05/2013 - 07/20/2017 Anti-estrogen oral therapy   Tamoxifen 20 mg daily   07/27/2017 Relapse/Recurrence   MRI Brain: 3.4 x 2.9 x 2.9 cm RIGHT frontal lobe mass with imaging characteristics of solitary metastasis. Extensive vasogenic edema resulting in 9 mm RIGHT to LEFT midline shift. Equivocal very early LEFT ventricle entrapment.    08/04/2017 Surgery   Rt frontal brain resection: Poorly differentiated tumor IHC suggests breast primary ER and PR Positive   08/25/2017 - 09/04/2017 Radiation Therapy   Stereotactic radiation   09/18/2017 -  Anti-estrogen oral therapy   Lapatinib with letrozole   12/18/2017 - 12/19/2017 Radiation Therapy   New right parietal lobe metastases status post [facility]   05/09/2019 Relapse/Recurrence   Interval increase in size of the enhancing nodular left internal mammary soft tissue 2.8 cm.  Redemonstrated enlarged supraclavicular, lower cervical and lower posterior cervical nodes unchanged.  Interval increase in the bony erosion of the posterior  and lateral left third rib, increasing soft tissue lesion eroding the left sternal body 3.1 cm was 2.5 cm.  Bronchiectatic changes   06/19/2019 -  Chemotherapy   The patient had ado-trastuzumab emtansine (KADCYLA) 180 mg in sodium chloride 0.9 % 250 mL chemo infusion, 3.6 mg/kg = 180 mg, Intravenous, Once, 8 of 13 cycles Administration: 180 mg (06/19/2019), 180 mg (07/10/2019), 180 mg (09/17/2019), 180 mg (08/05/2019), 180 mg (08/27/2019), 180 mg (10/08/2019), 180 mg (10/29/2019), 180 mg (11/19/2019)  for chemotherapy treatment.    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      CHIEF COMPLIANT: Follow-up of metastatic breast cancer  INTERVAL HISTORY: Melissa [name] is a 54 y.o. with above-mentioned history of metastatic breast cancer with brain metastasiss/presection who is currently [place] every 3 weeks. Echo on 11/25/18 showed an ejection fraction of 60-65%. She presents to the clinic todayfortreatment.  ALLERGIES:  is allergic to aspirin; protonix [pantoprazole]; and iodinated diagnostic agents.  MEDICATIONS:  Current Outpatient Medications  Medication Sig Dispense Refill  . albuterol (VENTOLIN HFA) 108 (90 BASE) MCG/ACT inhaler Inhale 2 puffs into the lungs every 6 (six) hours as needed. 1 Inhaler 3  .  budesonide-formoterol (SYMBICORT) 160-4.5 MCG/ACT inhaler Inhale 2 puffs into the lungs 2 (two) times daily. 3 Inhaler 3  . CRESEMBA 186 MG CAPS TAKE 2 CAPSULES BY MOUTH EVERY 24 HOURS 56 capsule 0  .  guaiFENesin-codeine (VIRTUSSIN A/C) 100-10 MG/5ML syrup Take 5 mLs by mouth 3 (three) times daily as needed for cough. 120 mL 0  . levalbuterol (XOPENEX) 0.63 MG/3ML nebulizer solution Take 3 mLs (0.63 mg total) by nebulization every 4 (four) hours as needed for wheezing or shortness of breath. 540 mL 6  . lidocaine-prilocaine (EMLA) cream Apply to affected area once 30 g 3  . Melatonin 5 MG TABS Take 5 mg by mouth at bedtime as needed.    . naproxen sodium (ALEVE) 220 MG tablet Take 440 mg by mouth 2 (two) times daily as needed (headache).     . predniSONE (DELTASONE) 50 MG tablet Take 1 tablet 13 hours, 7 hours, and 1 hour prior to contrast (Patient not taking: Reported on 10/29/2019) 3 tablet 4   No current facility-administered medications for this visit.    PHYSICAL EXAMINATION: ECOG PERFORMANCE STATUS: 1 - Symptomatic but completely ambulatory  Vitals:   12/10/19 1207  BP: 105/60  Pulse: 86  Resp: 18  Temp: 98.7 F (37.1 C)  SpO2: 98%   Filed Weights   12/10/19 1207  Weight: 116 lb 8 oz (52.8 kg)    LABORATORY DATA:  I have reviewed the data as listed CMP Latest Ref Rng & Units 11/19/2019 10/29/2019 10/08/2019  Glucose 70 - 99 mg/dL 83 110(H) 95  BUN 6 - 20 mg/dL '9 7 8  '$ Creatinine 0.44 - 1.00 mg/dL 1.04(H) 1.00 0.90  Sodium 135 - 145 mmol/L 139 137 140  Potassium 3.5 - 5.1 mmol/L 4.7 3.6 3.9  Chloride 98 - 111 mmol/L 104 104 106  CO2 22 - 32 mmol/L '25 24 25  '$ Calcium 8.9 - 10.3 mg/dL 9.9 9.2 9.3  Total Protein 6.5 - 8.1 g/dL 8.5(H) 7.7 7.8  Total Bilirubin 0.3 - 1.2 mg/dL <0.2(L) <0.2(L) <0.2(L)  Alkaline Phos 38 - 126 U/L 109 98 100  AST 15 - 41 U/L '22 16 18  '$ ALT 0 - 44 U/L '14 12 12    '$ Lab Results  Component Value Date   WBC 10.5 12/10/2019   HGB 8.2 (L) 12/10/2019   HCT 26.2 (L) 12/10/2019   MCV 83.2 12/10/2019   PLT 439 (H) 12/10/2019   NEUTROABS 7.3 12/10/2019    ASSESSMENT & PLAN:  Breast cancer of upper-inner quadrant of left female breast ([place]) 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 Anti Her 2 therapy with Lapatinibstarted 09/17/2017-05/14/2019: Stopped for progression 3.I discontinuedtamoxifen and started her on letrozole 2.5 mg daily.05/14/2019 stopped for progression 4.Stereotactic radiosurgery 12/19/2017 to the new right parietal lobe metastases. -------------------------------------------------------------------------------------------------------------------- Liver Biopsy 06/05/19: Metastatic cancer, ER/PR: 0%, Her 2: 3+ Positive, Ki 67: 20% Patient has metastases to liver, bone, brain, and questionably lung  Current treatment: Kadcyla every 3 weeks, today is cycle 9 Echo on3/17/2021shows EF of 60-65% CT CAP: 10/22/2019: Right hepatic lobe lesion 2.5 x 2.1 cm decreased from 7 x 6.4 cm.  Stable left upper lobe  and left lower lobe cavitary lesions.  Stable sclerotic area involving body of sternum. Bone scan 10/23/2019: Skeletal metastases left third rib and midline sternum.  No additional lesions. MRI brain 11/04/2019: stable, repeat MRI in 3 months.  Kadcyla toxicities: 1.Diarrhea:Well controlled 2.Chronic mild peripheral neuropathy:Stable 3.Normocytic anemia: Hemoglobin is 8.2 treatment related, following, patient is asymptomatic  Bone metastases: she has not started Xgeva.We are awaiting dental clearance and extractions as she has several broken teeth.  Return to clinic every 3 weeks for Kadcyla and every 6 weeks of follow-up with me.    No orders of the defined types  were placed in this encounter.  The patient has a good understanding of the overall plan. she agrees with it. she will call with any problems that may develop before the next visit here.  Total time spent: 30 mins including face to face time and time spent for planning, charting and coordination of care  Nicholas Lose, MD 12/10/2019  I, Cloyde Reams Dorshimer, am acting as scribe for Dr. Nicholas Lose.  I have reviewed the above documentation for accuracy and completeness, and I agree with the above.

## 2019-12-10 ENCOUNTER — Inpatient Hospital Stay (HOSPITAL_BASED_OUTPATIENT_CLINIC_OR_DEPARTMENT_OTHER): Payer: PRIVATE HEALTH INSURANCE | Admitting: Hematology and Oncology

## 2019-12-10 ENCOUNTER — Other Ambulatory Visit: Payer: Self-pay | Admitting: Hematology and Oncology

## 2019-12-10 ENCOUNTER — Inpatient Hospital Stay: Payer: PRIVATE HEALTH INSURANCE

## 2019-12-10 ENCOUNTER — Inpatient Hospital Stay: Payer: PRIVATE HEALTH INSURANCE | Attending: Medical

## 2019-12-10 ENCOUNTER — Other Ambulatory Visit: Payer: Self-pay

## 2019-12-10 DIAGNOSIS — Z171 Estrogen receptor negative status [ER-]: Secondary | ICD-10-CM

## 2019-12-10 DIAGNOSIS — C50212 Malignant neoplasm of upper-inner quadrant of left female breast: Secondary | ICD-10-CM | POA: Insufficient documentation

## 2019-12-10 DIAGNOSIS — Z452 Encounter for adjustment and management of vascular access device: Secondary | ICD-10-CM | POA: Insufficient documentation

## 2019-12-10 DIAGNOSIS — C50912 Malignant neoplasm of unspecified site of left female breast: Secondary | ICD-10-CM

## 2019-12-10 DIAGNOSIS — C787 Secondary malignant neoplasm of liver and intrahepatic bile duct: Secondary | ICD-10-CM | POA: Diagnosis present

## 2019-12-10 DIAGNOSIS — C7951 Secondary malignant neoplasm of bone: Secondary | ICD-10-CM | POA: Insufficient documentation

## 2019-12-10 DIAGNOSIS — Z95828 Presence of other vascular implants and grafts: Secondary | ICD-10-CM

## 2019-12-10 DIAGNOSIS — C7931 Secondary malignant neoplasm of brain: Secondary | ICD-10-CM | POA: Insufficient documentation

## 2019-12-10 DIAGNOSIS — Z5112 Encounter for antineoplastic immunotherapy: Secondary | ICD-10-CM | POA: Diagnosis present

## 2019-12-10 LAB — CBC WITH DIFFERENTIAL/PLATELET
Abs Immature Granulocytes: 0.03 10*3/uL (ref 0.00–0.07)
Basophils Absolute: 0.1 10*3/uL (ref 0.0–0.1)
Basophils Relative: 1 %
Eosinophils Absolute: 0.5 10*3/uL (ref 0.0–0.5)
Eosinophils Relative: 5 %
HCT: 26.2 % — ABNORMAL LOW (ref 36.0–46.0)
Hemoglobin: 8.2 g/dL — ABNORMAL LOW (ref 12.0–15.0)
Immature Granulocytes: 0 %
Lymphocytes Relative: 20 %
Lymphs Abs: 2.1 10*3/uL (ref 0.7–4.0)
MCH: 26 pg (ref 26.0–34.0)
MCHC: 31.3 g/dL (ref 30.0–36.0)
MCV: 83.2 fL (ref 80.0–100.0)
Monocytes Absolute: 0.6 10*3/uL (ref 0.1–1.0)
Monocytes Relative: 5 %
Neutro Abs: 7.3 10*3/uL (ref 1.7–7.7)
Neutrophils Relative %: 69 %
Platelets: 439 10*3/uL — ABNORMAL HIGH (ref 150–400)
RBC: 3.15 MIL/uL — ABNORMAL LOW (ref 3.87–5.11)
RDW: 19.2 % — ABNORMAL HIGH (ref 11.5–15.5)
WBC: 10.5 10*3/uL (ref 4.0–10.5)
nRBC: 0 % (ref 0.0–0.2)

## 2019-12-10 LAB — COMPREHENSIVE METABOLIC PANEL
ALT: 11 U/L (ref 0–44)
AST: 13 U/L — ABNORMAL LOW (ref 15–41)
Albumin: 3.3 g/dL — ABNORMAL LOW (ref 3.5–5.0)
Alkaline Phosphatase: 101 U/L (ref 38–126)
Anion gap: 10 (ref 5–15)
BUN: 9 mg/dL (ref 6–20)
CO2: 25 mmol/L (ref 22–32)
Calcium: 9.7 mg/dL (ref 8.9–10.3)
Chloride: 102 mmol/L (ref 98–111)
Creatinine, Ser: 0.95 mg/dL (ref 0.44–1.00)
GFR calc Af Amer: >60 mL/min (ref >60)
GFR calc non Af Amer: >60 mL/min (ref >60)
Glucose, Bld: 97 mg/dL (ref 70–99)
Potassium: 3.9 mmol/L (ref 3.5–5.1)
Sodium: 137 mmol/L (ref 135–145)
Total Bilirubin: <0.2 mg/dL — ABNORMAL LOW (ref 0.3–1.2)
Total Protein: 7.7 g/dL (ref 6.5–8.1)

## 2019-12-10 MED ORDER — SODIUM CHLORIDE 0.9% FLUSH
10.0000 mL | INTRAVENOUS | Status: DC | PRN
  Administered 2019-12-10: 10 mL
  Filled 2019-12-10: qty 10

## 2019-12-10 MED ORDER — DIPHENHYDRAMINE HCL 25 MG PO CAPS
ORAL_CAPSULE | ORAL | Status: AC
  Filled 2019-12-10: qty 1

## 2019-12-10 MED ORDER — ACETAMINOPHEN 325 MG PO TABS
650.0000 mg | ORAL_TABLET | Freq: Once | ORAL | Status: AC
  Administered 2019-12-10: 650 mg via ORAL

## 2019-12-10 MED ORDER — SODIUM CHLORIDE 0.9 % IV SOLN
Freq: Once | INTRAVENOUS | Status: AC
  Filled 2019-12-10: qty 250

## 2019-12-10 MED ORDER — SODIUM CHLORIDE 0.9 % IV SOLN
3.7000 mg/kg | Freq: Once | INTRAVENOUS | Status: AC
  Administered 2019-12-10: 200 mg via INTRAVENOUS
  Filled 2019-12-10: qty 10

## 2019-12-10 MED ORDER — HEPARIN SOD (PORK) LOCK FLUSH 100 UNIT/ML IV SOLN
500.0000 [IU] | Freq: Once | INTRAVENOUS | Status: AC | PRN
  Administered 2019-12-10: 500 [IU]
  Filled 2019-12-10: qty 5

## 2019-12-10 MED ORDER — ACETAMINOPHEN 325 MG PO TABS
ORAL_TABLET | ORAL | Status: AC
  Filled 2019-12-10: qty 2

## 2019-12-10 MED ORDER — DIPHENHYDRAMINE HCL 25 MG PO CAPS
25.0000 mg | ORAL_CAPSULE | Freq: Once | ORAL | Status: AC
  Administered 2019-12-10: 25 mg via ORAL

## 2019-12-10 NOTE — Patient Instructions (Signed)
Cancer Center Discharge Instructions for Patients Receiving Chemotherapy  Today you received the following chemotherapy agents Kadcyla  To help prevent nausea and vomiting after your treatment, we encourage you to take your nausea medication as directed   If you develop nausea and vomiting that is not controlled by your nausea medication, call the clinic.   BELOW ARE SYMPTOMS THAT SHOULD BE REPORTED IMMEDIATELY:  *FEVER GREATER THAN 100.5 F  *CHILLS WITH OR WITHOUT FEVER  NAUSEA AND VOMITING THAT IS NOT CONTROLLED WITH YOUR NAUSEA MEDICATION  *UNUSUAL SHORTNESS OF BREATH  *UNUSUAL BRUISING OR BLEEDING  TENDERNESS IN MOUTH AND THROAT WITH OR WITHOUT PRESENCE OF ULCERS  *URINARY PROBLEMS  *BOWEL PROBLEMS  UNUSUAL RASH Items with * indicate a potential emergency and should be followed up as soon as possible.  Feel free to call the clinic should you have any questions or concerns. The clinic phone number is [phone].  Please show the CHEMO ALERT CARD at check-in to the Emergency Department and triage nurse.   

## 2019-12-10 NOTE — Assessment & Plan Note (Signed)
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 Anti Her 2 therapy with Lapatinibstarted 09/17/2017-05/14/2019: Stopped for progression 3.I discontinuedtamoxifen and started her on letrozole 2.5 mg daily.05/14/2019 stopped for progression 4.Stereotactic radiosurgery 12/19/2017 to the new right parietal lobe metastases. -------------------------------------------------------------------------------------------------------------------- Liver Biopsy 06/05/19: Metastatic cancer, ER/PR: 0%, Her 2: 3+ Positive, Ki 67: 20% Patient has metastases to liver, bone, brain, and questionably lung  Current treatment: Kadcyla every 3 weeks, today is cycle 9 Echo on3/17/2021shows EF of 60-65% CT CAP: 10/22/2019: Right hepatic lobe lesion 2.5 x 2.1 cm decreased from 7 x 6.4 cm.  Stable left upper lobe and left lower lobe cavitary lesions.  Stable sclerotic area involving body of sternum. Bone scan 10/23/2019: Skeletal metastases left third rib and midline sternum.  No additional lesions. MRI brain 11/04/2019: stable, repeat MRI in 3 months.  Kadcyla toxicities: 1.Diarrhea:Well controlled 2.Chronic mild peripheral neuropathy:Stable 3.Normocytic anemia: ? Treatment related, following  Bone metastases: she has not started Xgeva.We are awaiting dental clearance and extractions as she has several broken teeth.  Return to clinic every 3 weeks for Kadcyla and every 6 weeks of follow-up with me.

## 2019-12-10 NOTE — Patient Instructions (Signed)

## 2019-12-12 ENCOUNTER — Telehealth: Payer: Self-pay | Admitting: Hematology and Oncology

## 2019-12-12 NOTE — Telephone Encounter (Signed)
Scheduled per 06/02 los, patient has been called and notified regarding upcoming appointments.

## 2019-12-13 ENCOUNTER — Encounter: Payer: Self-pay | Admitting: Pharmacy Technician

## 2019-12-13 NOTE — Progress Notes (Signed)
Patient no longer getting Kadcyla from [place] based on insurance coverage. Last DOS covered is 10/29/19.

## 2019-12-16 ENCOUNTER — Other Ambulatory Visit: Payer: Self-pay

## 2019-12-16 ENCOUNTER — Ambulatory Visit (INDEPENDENT_AMBULATORY_CARE_PROVIDER_SITE_OTHER): Payer: PRIVATE HEALTH INSURANCE | Admitting: Internal Medicine

## 2019-12-16 ENCOUNTER — Encounter: Payer: Self-pay | Admitting: Internal Medicine

## 2019-12-16 VITALS — BP 116/69 | HR 72 | Temp 98.7°F | Wt 118.2 lb

## 2019-12-16 DIAGNOSIS — J984 Other disorders of lung: Secondary | ICD-10-CM | POA: Diagnosis not present

## 2019-12-16 DIAGNOSIS — J471 Bronchiectasis with (acute) exacerbation: Secondary | ICD-10-CM

## 2019-12-16 DIAGNOSIS — B4489 Other forms of aspergillosis: Secondary | ICD-10-CM

## 2019-12-16 NOTE — Progress Notes (Signed)
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   MRN: [id number]  HPI 54yo F with metastatic breast ca affecting brain, liver, and skeletal mets. Showing improvement on hepatic lesions on recent imaging. She continues to take antifungal treatment without difficulty  - gardening - continue mask and safety glasses  Shortness of breath with over exertionl; takes a break every 20 minutes to catch her breath  - about the same with chronic cough. But notices change with humidity - easy bruising  covid vaccine hesistant  Outpatient Encounter Medications as of 12/16/2019  Medication Sig   budesonide-formoterol (SYMBICORT) 160-4.5 MCG/ACT inhaler Inhale 2 puffs into the lungs 2 (two) times daily.   CRESEMBA 186 MG CAPS TAKE 2 CAPSULES BY MOUTH EVERY 24 HOURS   guaiFENesin-codeine (VIRTUSSIN A/C) 100-10 MG/5ML syrup Take 5 mLs by mouth 3 (three) times daily as needed for cough.   levalbuterol (XOPENEX) 0.63 MG/3ML nebulizer solution Take 3 mLs (0.63 mg total) by nebulization every 4 (four) hours as needed for wheezing or shortness of breath.   lidocaine-prilocaine (EMLA) cream Apply to affected area once   Melatonin 5 MG TABS Take 5 mg by mouth at bedtime as needed.   naproxen sodium (ALEVE) 220 MG tablet Take 440 mg by mouth 2 (two) times daily as needed (headache).    predniSONE (DELTASONE) 50 MG tablet Take 1 tablet 13 hours, 7 hours, and 1 hour prior to contrast   albuterol (VENTOLIN HFA) 108 (90 BASE) MCG/ACT inhaler Inhale 2 puffs into the lungs every 6 (six) hours as needed. (Patient not taking: Reported on 12/16/2019)   No facility-administered encounter medications on file as of 12/16/2019.     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[facility]) 06/10/2019   Mediastinal adenopathy    H/O coccidioidomycosis 05/16/2018   DOE (dyspnea  on exertion) 05/03/2018   Diarrhea 04/22/2018   Cavitary lesion of lung in area of previous cyts in apex of Sup Segment of LLL 03/29/2018   Aortic atherosclerosis ([place]) 01/22/2018   Emphysema lung ([place]) 01/22/2018   CAP (community acquired pneumonia) 11/23/2017   Metastasis to brain ([place]) 08/04/2017   Nicotine abuse 07/27/2017   Migraines 07/27/2017   Malignant tumor of breast ([place]) 06/12/2017   HCAP (healthcare-associated pneumonia) 11/26/2016   Upper airway cough syndrome 08/10/2016   Abnormal echocardiogram 08/07/2013   Chest tightness or pressure 08/07/2013   Hx of radiation therapy    Edema of left lower extremity 11/19/2012   Tachycardia 09/20/2012   Breast cancer of upper-inner quadrant of left female breast ([place]) 06/08/2012   Obstructive bronchiectasis ([place]) 01/26/2011   Cigarette smoker 01/26/2011     Health Maintenance Due  Topic Date Due   COVID-19 Vaccine (1) Never done   TETANUS/TDAP  Never done   PAP SMEAR-Modifier  11/20/2016   COLONOSCOPY  Never done     Review of Systems 12 point ros is negative except what is mentioned in hpi Physical Exam   BP 116/69    Pulse 72    Temp 98.7 F (37.1 C)    Wt 118 lb 3.2 oz (53.6 kg)    LMP 07/25/2012 Comment: pregnancy waiver form signed 01-18-2018   SpO2 99%    BMI 21.62 kg/m   Physical Exam  Constitutional:  oriented to person, place, and time. appears well-developed and well-nourished. No distress.  HENT: [place]/AT, PERRLA, no scleral icterus Mouth/Throat: Oropharynx is clear and moist. No oropharyngeal exudate.  Cardiovascular: Normal rate, regular rhythm and normal heart sounds. Exam reveals no gallop and no friction rub.  No murmur heard.  Pulmonary/Chest: Effort normal and breath sounds normal. Exp wheeze Lymphadenopathy: no cervical adenopathy. No axillary adenopathy Neurological: alert and oriented to person, place, and time.  Skin: Skin is warm and dry. No rash noted. No erythema.    Psychiatric: a normal mood and affect.  behavior is normal.   CBC Lab Results  Component Value Date   WBC 10.5 12/10/2019   RBC 3.15 (L) 12/10/2019   HGB 8.2 (L) 12/10/2019   HCT 26.2 (L) 12/10/2019   PLT 439 (H) 12/10/2019   MCV 83.2 12/10/2019   MCH 26.0 12/10/2019   MCHC 31.3 12/10/2019   RDW 19.2 (H) 12/10/2019   LYMPHSABS 2.1 12/10/2019   MONOABS 0.6 12/10/2019   EOSABS 0.5 12/10/2019    BMET Lab Results  Component Value Date   NA 137 12/10/2019   K 3.9 12/10/2019   CL 102 12/10/2019   CO2 25 12/10/2019   GLUCOSE 97 12/10/2019   BUN 9 12/10/2019   CREATININE 0.95 12/10/2019   CALCIUM 9.7 12/10/2019   GFRNONAA >60 12/10/2019   GFRAA >60 12/10/2019      Assessment and Plan  Fungal pneumonia = continue on cresemba for now, plan to repeat imaging of chest and repeat sputum cultures to see if/when can stop antifungal therapy Lets see in 3 months - discuss stopping antifungals, assess scans

## 2019-12-17 ENCOUNTER — Telehealth: Payer: Self-pay | Admitting: Pharmacy Technician

## 2019-12-17 NOTE — Telephone Encounter (Signed)
[facility] Patient Advocate Encounter  Received fax notification today that the patient has been withdrawn from the [place] patient assistance program due to eligibilty period expired on 12/14/2019.   [phone] for additional questions.

## 2019-12-23 ENCOUNTER — Other Ambulatory Visit: Payer: Self-pay | Admitting: *Deleted

## 2019-12-23 ENCOUNTER — Telehealth: Payer: Self-pay | Admitting: *Deleted

## 2019-12-23 DIAGNOSIS — J984 Other disorders of lung: Secondary | ICD-10-CM

## 2019-12-23 NOTE — Telephone Encounter (Signed)
-----   Message from Carlyle Basques, MD sent at 12/21/2019  9:01 AM EDT ----- Can you have ms. [place] come to clinic to pick up sputum cup to do fungal and afb culture at her convenience

## 2019-12-23 NOTE — Telephone Encounter (Signed)
Left message for patient to pick up sputum cup from front desk.  Lab order placed for future drop off. Landis Gandy, RN

## 2019-12-24 ENCOUNTER — Other Ambulatory Visit: Payer: Self-pay | Admitting: Hematology and Oncology

## 2019-12-24 DIAGNOSIS — Z1231 Encounter for screening mammogram for malignant neoplasm of breast: Secondary | ICD-10-CM

## 2019-12-26 ENCOUNTER — Other Ambulatory Visit: Payer: Self-pay | Admitting: Internal Medicine

## 2019-12-30 ENCOUNTER — Other Ambulatory Visit: Payer: Self-pay | Admitting: *Deleted

## 2019-12-30 DIAGNOSIS — C7931 Secondary malignant neoplasm of brain: Secondary | ICD-10-CM

## 2019-12-31 ENCOUNTER — Inpatient Hospital Stay: Payer: PRIVATE HEALTH INSURANCE

## 2019-12-31 ENCOUNTER — Other Ambulatory Visit: Payer: Self-pay

## 2019-12-31 ENCOUNTER — Inpatient Hospital Stay (HOSPITAL_BASED_OUTPATIENT_CLINIC_OR_DEPARTMENT_OTHER): Payer: PRIVATE HEALTH INSURANCE | Admitting: Adult Health

## 2019-12-31 ENCOUNTER — Ambulatory Visit
Admission: RE | Admit: 2019-12-31 | Discharge: 2019-12-31 | Disposition: A | Discharge status: Home / Self Care | Payer: PRIVATE HEALTH INSURANCE | Source: Ambulatory Visit | Attending: Hematology and Oncology | Admitting: Hematology and Oncology

## 2019-12-31 ENCOUNTER — Ambulatory Visit (HOSPITAL_COMMUNITY)
Admission: RE | Admit: 2019-12-31 | Discharge: 2019-12-31 | Disposition: A | Discharge status: Home / Self Care | Payer: PRIVATE HEALTH INSURANCE | Source: Ambulatory Visit | Attending: Adult Health | Admitting: Adult Health

## 2019-12-31 VITALS — BP 118/75 | HR 76 | Temp 98.0°F | Resp 18 | Ht 62.0 in | Wt 117.6 lb

## 2019-12-31 DIAGNOSIS — Z171 Estrogen receptor negative status [ER-]: Secondary | ICD-10-CM

## 2019-12-31 DIAGNOSIS — C50912 Malignant neoplasm of unspecified site of left female breast: Secondary | ICD-10-CM | POA: Insufficient documentation

## 2019-12-31 DIAGNOSIS — Z1231 Encounter for screening mammogram for malignant neoplasm of breast: Secondary | ICD-10-CM

## 2019-12-31 DIAGNOSIS — J069 Acute upper respiratory infection, unspecified: Secondary | ICD-10-CM

## 2019-12-31 DIAGNOSIS — R05 Cough: Secondary | ICD-10-CM | POA: Diagnosis not present

## 2019-12-31 DIAGNOSIS — R059 Cough, unspecified: Secondary | ICD-10-CM

## 2019-12-31 DIAGNOSIS — C7931 Secondary malignant neoplasm of brain: Secondary | ICD-10-CM

## 2019-12-31 DIAGNOSIS — Z95828 Presence of other vascular implants and grafts: Secondary | ICD-10-CM

## 2019-12-31 DIAGNOSIS — C50212 Malignant neoplasm of upper-inner quadrant of left female breast: Secondary | ICD-10-CM

## 2019-12-31 DIAGNOSIS — Z5112 Encounter for antineoplastic immunotherapy: Secondary | ICD-10-CM | POA: Diagnosis not present

## 2019-12-31 HISTORY — DX: Secondary malignant neoplasm of liver and intrahepatic bile duct: C78.7

## 2019-12-31 LAB — CBC WITH DIFFERENTIAL/PLATELET
Abs Immature Granulocytes: 0.03 10*3/uL (ref 0.00–0.07)
Basophils Absolute: 0.1 10*3/uL (ref 0.0–0.1)
Basophils Relative: 1 %
Eosinophils Absolute: 0.9 10*3/uL — ABNORMAL HIGH (ref 0.0–0.5)
Eosinophils Relative: 8 %
HCT: 26.4 % — ABNORMAL LOW (ref 36.0–46.0)
Hemoglobin: 8 g/dL — ABNORMAL LOW (ref 12.0–15.0)
Immature Granulocytes: 0 %
Lymphocytes Relative: 19 %
Lymphs Abs: 1.9 10*3/uL (ref 0.7–4.0)
MCH: 25.3 pg — ABNORMAL LOW (ref 26.0–34.0)
MCHC: 30.3 g/dL (ref 30.0–36.0)
MCV: 83.5 fL (ref 80.0–100.0)
Monocytes Absolute: 0.7 10*3/uL (ref 0.1–1.0)
Monocytes Relative: 7 %
Neutro Abs: 6.8 10*3/uL (ref 1.7–7.7)
Neutrophils Relative %: 65 %
Platelets: 408 10*3/uL — ABNORMAL HIGH (ref 150–400)
RBC: 3.16 MIL/uL — ABNORMAL LOW (ref 3.87–5.11)
RDW: 20 % — ABNORMAL HIGH (ref 11.5–15.5)
WBC: 10.3 10*3/uL (ref 4.0–10.5)
nRBC: 0 % (ref 0.0–0.2)

## 2019-12-31 LAB — COMPREHENSIVE METABOLIC PANEL
ALT: 12 U/L (ref 0–44)
AST: 15 U/L (ref 15–41)
Albumin: 3.2 g/dL — ABNORMAL LOW (ref 3.5–5.0)
Alkaline Phosphatase: 116 U/L (ref 38–126)
Anion gap: 11 (ref 5–15)
BUN: 8 mg/dL (ref 6–20)
CO2: 25 mmol/L (ref 22–32)
Calcium: 9.7 mg/dL (ref 8.9–10.3)
Chloride: 104 mmol/L (ref 98–111)
Creatinine, Ser: 1.05 mg/dL — ABNORMAL HIGH (ref 0.44–1.00)
GFR calc Af Amer: >60 mL/min (ref >60)
GFR calc non Af Amer: >60 mL/min (ref >60)
Glucose, Bld: 97 mg/dL (ref 70–99)
Potassium: 4.6 mmol/L (ref 3.5–5.1)
Sodium: 140 mmol/L (ref 135–145)
Total Bilirubin: <0.2 mg/dL — ABNORMAL LOW (ref 0.3–1.2)
Total Protein: 8 g/dL (ref 6.5–8.1)

## 2019-12-31 MED ORDER — HYDROCOD POLST-CPM POLST ER 10-8 MG/5ML PO SUER: 5.0000 mL | Freq: Two times a day (BID) | ORAL | 0 refills | Status: DC | PRN

## 2019-12-31 MED ORDER — ALTEPLASE 2 MG IJ SOLR
INTRAMUSCULAR | Status: AC
  Filled 2019-12-31: qty 2

## 2019-12-31 MED ORDER — DIPHENHYDRAMINE HCL 25 MG PO CAPS
25.0000 mg | ORAL_CAPSULE | Freq: Once | ORAL | Status: AC
  Administered 2019-12-31: 25 mg via ORAL

## 2019-12-31 MED ORDER — DIPHENHYDRAMINE HCL 25 MG PO CAPS
ORAL_CAPSULE | ORAL | Status: AC
  Filled 2019-12-31: qty 2

## 2019-12-31 MED ORDER — ALBUTEROL SULFATE HFA 108 (90 BASE) MCG/ACT IN AERS
2.0000 | INHALATION_SPRAY | Freq: Four times a day (QID) | RESPIRATORY_TRACT | 5 refills | Status: DC | PRN
Start: 2019-12-31 — End: 2020-11-03

## 2019-12-31 MED ORDER — HEPARIN SOD (PORK) LOCK FLUSH 100 UNIT/ML IV SOLN
500.0000 [IU] | Freq: Once | INTRAVENOUS | Status: AC | PRN
  Administered 2019-12-31: 500 [IU]
  Filled 2019-12-31: qty 5

## 2019-12-31 MED ORDER — ACETAMINOPHEN 325 MG PO TABS
650.0000 mg | ORAL_TABLET | Freq: Once | ORAL | Status: AC
  Administered 2019-12-31: 650 mg via ORAL

## 2019-12-31 MED ORDER — ACETAMINOPHEN 325 MG PO TABS
ORAL_TABLET | ORAL | Status: AC
  Filled 2019-12-31: qty 2

## 2019-12-31 MED ORDER — SODIUM CHLORIDE 0.9 % IV SOLN
Freq: Once | INTRAVENOUS | Status: AC
  Filled 2019-12-31: qty 250

## 2019-12-31 MED ORDER — SODIUM CHLORIDE 0.9% FLUSH
10.0000 mL | INTRAVENOUS | Status: DC | PRN
  Administered 2019-12-31: 10 mL
  Filled 2019-12-31: qty 10

## 2019-12-31 MED ORDER — SODIUM CHLORIDE 0.9 % IV SOLN
3.7000 mg/kg | Freq: Once | INTRAVENOUS | Status: AC
  Administered 2019-12-31: 200 mg via INTRAVENOUS
  Filled 2019-12-31: qty 10

## 2019-12-31 MED ORDER — ALTEPLASE 2 MG IJ SOLR
2.0000 mg | Freq: Once | INTRAMUSCULAR | Status: AC | PRN
  Administered 2019-12-31: 2 mg
  Filled 2019-12-31: qty 2

## 2019-12-31 NOTE — Progress Notes (Signed)
Pt. states she has been doing fine with treatment and declines to stay 30 minute post observation.

## 2019-12-31 NOTE — Progress Notes (Signed)
[place] [facility] Follow up:    Patient, [name] No address on file   DIAGNOSIS: Cancer Staging Breast cancer of upper-inner quadrant of left female breast ([place]) Staging form: Breast, AJCC 7th Edition - Clinical: Stage IIA (T2, N0, cM0) - Unsigned Staging comments: Staged at breast conference 12.4.13  - Pathologic: Stage IIA (T1c, N1, cM0) - Unsigned   SUMMARY OF ONCOLOGIC HISTORY: Oncology History  Breast cancer of upper-inner quadrant of left female breast ([place])  06/08/2012 Initial Diagnosis   invasive ductal carcinoma that was ER positive PR positive HER-2/neu positive measuring 3.1 cm by MRI criteria. Ki-67 was 70% HER-2 was amplified with a ratio 2.91   07/12/2012 - 07/17/2013 Neo-Adjuvant Chemotherapy   TCH 6 followed by Herceptin maintenance   12/11/2012 Surgery   Left breast lumpectomy: 1.8 cm tumor 1 positive sentinel node, axillary lymph node dissection 02/08/2013 showed 0/13 lymph nodes   03/25/2013 - 05/06/2013 Radiation Therapy   Adjuvant radiation therapy   06/05/2013 - 07/20/2017 Anti-estrogen oral therapy   Tamoxifen 20 mg daily   07/27/2017 Relapse/Recurrence   MRI Brain: 3.4 x 2.9 x 2.9 cm RIGHT frontal lobe mass with imaging characteristics of solitary metastasis. Extensive vasogenic edema resulting in 9 mm RIGHT to LEFT midline shift. Equivocal very early LEFT ventricle entrapment.    08/04/2017 Surgery   Rt frontal brain resection: Poorly differentiated tumor IHC suggests breast primary ER and PR Positive   08/25/2017 - 09/04/2017 Radiation Therapy   Stereotactic radiation   09/18/2017 -  Anti-estrogen oral therapy   Lapatinib with letrozole   12/18/2017 - 12/19/2017 Radiation Therapy   New right parietal lobe metastases status post [facility]   05/09/2019 Relapse/Recurrence   Interval increase in size of the enhancing nodular left internal mammary soft tissue 2.8 cm.  Redemonstrated enlarged supraclavicular, lower cervical and lower posterior  cervical nodes unchanged.  Interval increase in the bony erosion of the posterior and lateral left third rib, increasing soft tissue lesion eroding the left sternal body 3.1 cm was 2.5 cm.  Bronchiectatic changes   06/19/2019 -  Chemotherapy   The patient had ado-trastuzumab emtansine (KADCYLA) 180 mg in sodium chloride 0.9 % 250 mL chemo infusion, 3.6 mg/kg = 180 mg, Intravenous, Once, 9 of 13 cycles Administration: 180 mg (06/19/2019), 180 mg (07/10/2019), 180 mg (09/17/2019), 180 mg (08/05/2019), 180 mg (08/27/2019), 180 mg (10/08/2019), 180 mg (10/29/2019), 180 mg (11/19/2019), 200 mg (12/10/2019)  for chemotherapy treatment.    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      CURRENT THERAPY: Kadcyla  INTERVAL HISTORY: Melissa [name] 54 y.o. female returns for evaluation of her metastatic breast cancer.  She continues on treatment with Kadcyla and is toelrating it well.  She says her biggest issues is her cough.  This is worse at night.  She is sleeping on three pillows, but the cough has been persistent.  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  She has had  no fevers or chills.  She has continued to smoke cigarettes about 1/2 ppd.  Patient Active Problem List   Diagnosis Date Noted  . Breast cancer of upper-inner quadrant of left female breast ([place]) 06/08/2012    Priority: High  . Port-A-Cath in place 08/05/2019  . 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 Mediastinal adenopathy   . H/O coccidioidomycosis 05/16/2018  . DOE (dyspnea on exertion) 05/03/2018  . Diarrhea 04/22/2018  . Cavitary lesion of lung in area of previous cyts in apex of Sup Segment of LLL 03/29/2018  . Aortic atherosclerosis ([place]) 01/22/2018  . Emphysema lung ([place]) 01/22/2018  . CAP (community acquired pneumonia) 11/23/2017  . Metastasis to brain ([place]) 08/04/2017  . Nicotine abuse 07/27/2017  . Migraines 07/27/2017  . Malignant tumor of breast ([place]) 06/12/2017  . HCAP (healthcare-associated pneumonia) 11/26/2016  . Upper airway cough syndrome 08/10/2016  . Abnormal echocardiogram 08/07/2013  . Chest tightness or pressure 08/07/2013  . Hx of radiation therapy   . Edema of left lower extremity 11/19/2012  . Tachycardia 09/20/2012  . Obstructive bronchiectasis ([place]) 01/26/2011  . Cigarette smoker 01/26/2011    is allergic to aspirin, protonix [pantoprazole], and iodinated diagnostic agents.  MEDICAL HISTORY: Past Medical History:  Diagnosis Date  . Anemia   . Arthritis    knees and hips  . Asthma   . Breast cancer ([place])   . Bronchiectasis ([place])   . Bronchiolitis   . Cancer [facility])    breast cancer 2014  . Complication of anesthesia    bp dropped + desat   . COPD (chronic obstructive pulmonary disease) ([place])   . Dyspnea    DOE  . GERD (gastroesophageal reflux disease)   . H/O coccidioidomycosis    was reason for lung lobectomy  . Headache(784.0)    due to eye strain or not eating  . History of anemia    no current problem  . History of asthma    as a child  . History of breast cancer 2014   left  . History of chemotherapy    finished 07/17/2013  . History of hiatal hernia    AGE 55  . Hx of radiation therapy 03/25/13-05/06/13   left breast 5000 cGy/25 sessions, left breast boost 1000 cGy/5 sessions  . Pneumonia    LAST FLARE UP 01/2018  . Runny nose 07/30/2013   clear drainage  . Wears dentures    upper    SURGICAL  HISTORY: Past Surgical History:  Procedure Laterality Date  . APPLICATION OF CRANIAL NAVIGATION N/A 08/04/2017   Procedure: APPLICATION OF CRANIAL NAVIGATION;  Surgeon: [name], [name], MD;  Location: [place];  Service: Neurosurgery;  Laterality: N/A;  . AXILLARY LYMPH NODE DISSECTION Left 02/08/2013   Procedure: LEFT AXILLARY DISSECTION;  Surgeon: Edward Jolly, MD;  Location: [place];  Service: General;  Laterality: Left;  . BREAST CYST EXCISION Right 2006  . BREAST LUMPECTOMY Left 2014  . BREAST LUMPECTOMY WITH NEEDLE LOCALIZATION AND AXILLARY SENTINEL LYMPH NODE BX Left 12/31/2012   Procedure: NEEDLE LOCALIZATION LEFT BREAST LUMPECTOMY AND LEFT AXILLARY SENTENIAL LYMPH NODE BX;  Surgeon: Edward Jolly, MD;  Location: [place];  Service: General;  Laterality: Left;  . CESAREAN SECTION  1995/1996  . CRANIOTOMY Right 08/04/2017   Procedure: Right Frontal craniotomy for resection of tumor with stereotactic navigation;  Surgeon: [name], [name], MD;  Location: [place];  Service: Neurosurgery;  Laterality: Right;  Right Frontal craniotomy for resection of tumor with stereotactic navigation  . IR IMAGING GUIDED PORT INSERTION  05/31/2019  . LUNG LOBECTOMY Left 05/1996   upper lobe - due to [facility] Fever  . PORT-A-CATH REMOVAL Right 08/02/2013   Procedure: REMOVAL PORT-A-CATH;  Surgeon: Edward Jolly, MD;  Location: [place];  Service: General;  Laterality: Right;  . PORTACATH PLACEMENT  07/02/2012   Procedure: INSERTION PORT-A-CATH;  Surgeon: Edward Jolly, MD;  Location: [place];  Service: General;  Laterality: N/A;  right  . VIDEO BRONCHOSCOPY WITH ENDOBRONCHIAL ULTRASOUND N/A 05/23/2018   Procedure: VIDEO BRONCHOSCOPY WITH ENDOBRONCHIAL ULTRASOUND;  Surgeon: Garner Nash, DO;  Location: [place] [facility];  Service: Thoracic;  Laterality: N/A;    SOCIAL HISTORY: Social History   Socioeconomic History  .  Marital status: Married    Spouse name: Not on file  . Number of children: 2  . Years of education: Not on file  . Highest education level: Not on file  Occupational History  . Occupation: Secondary school teacher  Tobacco Use  . Smoking status: Current Every Day Smoker    Packs/day: 0.75    Years: 24.00    Pack years: 18.00    Types: Cigarettes  . Smokeless tobacco: Never Used  Vaping Use  . Vaping Use: Never used  Substance and Sexual Activity  . Alcohol use: No  . Drug use: No  . Sexual activity: Not Currently  Other Topics Concern  . Not on file  Social History Narrative  . Not on file   Social Determinants of Health   Financial Resource Strain:   . Difficulty of Paying Living Expenses:   Food Insecurity:   . Worried About Charity fundraiser in the Last Year:   . Arboriculturist in the Last Year:   Transportation Needs:   . Film/video editor (Medical):   Marland Kitchen Lack of Transportation (Non-Medical):   Physical Activity:   . Days of Exercise per Week:   . Minutes of Exercise per Session:   Stress:   . Feeling of Stress :   Social Connections:   . Frequency of Communication with Friends and Family:   . Frequency of Social Gatherings with Friends and Family:   . Attends Religious Services:   . Active Member of Clubs or Organizations:   . Attends Archivist Meetings:   Marland Kitchen Marital Status:   Intimate Partner Violence:   . Fear of Current or Ex-Partner:   . Emotionally Abused:   Marland Kitchen Physically Abused:   . Sexually Abused:     FAMILY HISTORY: Family History  Problem Relation Age of Onset  . Emphysema Mother        was a smoker  . Heart disease Mother   . Melanoma Mother        dx in her 39s  . Breast cancer Mother 86  . Asthma Brother   . Breast cancer Cousin        mother's maternal cousin; dx in her 76s    Review of Systems  Constitutional: Negative for appetite change, chills, fatigue and fever.  HENT:   Negative for hearing loss and lump/mass.    Eyes: Negative for eye problems and icterus.  Respiratory: Positive for cough and shortness of breath (occasional). Negative for chest tightness, hemoptysis and wheezing.   Cardiovascular: Negative for chest pain, leg swelling and palpitations.  Gastrointestinal: Negative for abdominal distention, abdominal pain, constipation, diarrhea, nausea and vomiting.  Endocrine: Negative for hot flashes.  Musculoskeletal: Negative for arthralgias.  Skin: Negative for itching and rash.  Neurological: Negative for dizziness, extremity weakness, headaches and numbness.  Hematological: Negative for adenopathy. Does not bruise/bleed easily.  Psychiatric/Behavioral: Negative for depression. The patient is not nervous/anxious.       PHYSICAL EXAMINATION  ECOG PERFORMANCE STATUS: 1 - Symptomatic but completely ambulatory  Vitals:   12/31/19 1158  BP: 118/75  Pulse: 76  Resp: 18  Temp: 98 F (36.7 C)  SpO2: 99%    Physical Exam Constitutional:      General: She is not in acute distress.    Appearance: Normal appearance. She is not toxic-appearing.  HENT:     Head: Normocephalic and atraumatic.  Eyes:     General: No scleral icterus. Cardiovascular:     Rate and Rhythm: Normal rate and regular rhythm.     Pulses: Normal pulses.     Heart sounds: Normal heart sounds.  Pulmonary:     Effort: Pulmonary effort is normal.     Breath sounds: Wheezing (left worse than right) present.  Abdominal:     General: Abdomen is flat. Bowel sounds are normal.     Palpations: Abdomen is soft.  Musculoskeletal:        General: No swelling.     Cervical back: Neck supple.  Lymphadenopathy:     Cervical: No cervical adenopathy.  Skin:    General: Skin is warm and dry.     Capillary Refill: Capillary refill takes less than 2 seconds.     Findings: No rash.  Neurological:     General: No focal deficit present.     Mental Status: She is alert.  Psychiatric:        Mood and Affect: Mood normal.         Behavior: Behavior normal.     LABORATORY DATA:  CBC    Component Value Date/Time   WBC 10.3 12/31/2019 1133   RBC 3.16 (L) 12/31/2019 1133   HGB 8.0 (L) 12/31/2019 1133   HGB 10.3 (L) 05/14/2019 0917   HGB 13.1 08/12/2014 0955   HCT 26.4 (L) 12/31/2019 1133   HCT 40.4 08/12/2014 0955   PLT 408 (H) 12/31/2019 1133   PLT 425 (H) 05/14/2019 0917   PLT 228 08/12/2014 0955   MCV 83.5 12/31/2019 1133   MCV 98.2 08/12/2014 0955   MCH 25.3 (L) 12/31/2019 1133   MCHC 30.3 12/31/2019 1133   RDW 20.0 (H) 12/31/2019 1133   RDW 13.0 08/12/2014 0955   LYMPHSABS 1.9 12/31/2019 1133   LYMPHSABS 2.0 08/12/2014 0955   MONOABS 0.7 12/31/2019 1133   MONOABS 0.5 08/12/2014 0955   EOSABS 0.9 (H) 12/31/2019 1133   EOSABS 0.2 08/12/2014 0955   BASOSABS 0.1 12/31/2019 1133   BASOSABS 0.0 08/12/2014 0955    CMP     Component Value Date/Time   NA 137 12/10/2019 1147   NA 142 08/12/2014 0956   K 3.9 12/10/2019 1147   K 4.6 08/12/2014 0956   CL 102 12/10/2019 1147   CL 100 12/20/2012 1509   CO2 25 12/10/2019 1147   CO2 26 08/12/2014 0956   GLUCOSE 97 12/10/2019 1147   GLUCOSE 94 08/12/2014 0956   GLUCOSE 98 12/20/2012 1509   BUN 9 12/10/2019 1147   BUN 10.1 08/12/2014 0956   CREATININE 0.95 12/10/2019 1147   CREATININE 1.01 (H) 05/14/2019 0917   CREATININE 0.88 03/11/2019 1057   CREATININE 0.9 08/12/2014 0956   CALCIUM  9.7 12/10/2019 1147   CALCIUM 9.3 08/12/2014 0956   PROT 7.7 12/10/2019 1147   PROT 7.1 08/12/2014 0956   ALBUMIN 3.3 (L) 12/10/2019 1147   ALBUMIN 3.8 08/12/2014 0956   AST 13 (L) 12/10/2019 1147   AST 13 (L) 05/14/2019 0917   AST 15 08/12/2014 0956   ALT 11 12/10/2019 1147   ALT 9 05/14/2019 0917   ALT 11 08/12/2014 0956   ALKPHOS 101 12/10/2019 1147   ALKPHOS 68 08/12/2014 0956   BILITOT <0.2 (L) 12/10/2019 1147   BILITOT <0.2 (L) 05/14/2019 0917   BILITOT 0.25 08/12/2014 0956   GFRNONAA >60 12/10/2019 1147   GFRNONAA >60 05/14/2019 0917   GFRNONAA 76  03/11/2019 1057   GFRAA >60 12/10/2019 1147   GFRAA >60 05/14/2019 0917   GFRAA 88 03/11/2019 1057      ASSESSMENT and THERAPY PLAN:   Breast cancer of upper-inner quadrant of left female breast ([place]) Left breast invasive ductal carcinoma ER/PR positive HER-2 positive initially 3.1 cm, Ki-67 70%, HER-2 amplified ratio 2.91 status post neoadjuvant chemotherapy followed by surgery which showed 1.8 cm tumor 1 positive sentinel lymph node T1cN1 M0 stage IB status post radiation therapy and Herceptin maintenanceand tooktamoxifen 06/05/2013-08/11/2017  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 Anti Her 2 therapy with Lapatinibstarted 09/17/2017-05/14/2019: Stopped for progression 3.I discontinuedtamoxifen and started her on letrozole 2.5 mg daily.05/14/2019 stopped for progression 4.Stereotactic radiosurgery 12/19/2017 to the new right parietal lobe metastases. -------------------------------------------------------------------------------------------------------------------- Liver Biopsy 06/05/19: Metastatic cancer, ER/PR: 0%, Her 2: 3+ Positive, Ki 67: 20% Patient has metastases to liver, bone, brain, and questionably lung  Current treatment: Kadcyla every 3 weeks Echo on3/17/2021shows EF of 60-65% CT CAP: 10/22/2019: Right hepatic lobe lesion 2.5 x 2.1 cm decreased from 7 x 6.4 cm.  Stable left upper lobe and left lower lobe cavitary lesions.  Stable sclerotic area involving body of sternum. Bone scan 10/23/2019: Skeletal metastases left third rib and midline sternum.  No additional lesions. MRI brain 11/04/2019: stable, repeat MRI in 3 months.  Kadcyla toxicities: She has mild anemia.  This is stable and likely treatment related.  We are monitoring this.  Her wheezing and cough is likely related to previous infection.  I placed orders for chest xray, sent in tussionex and refills on her  albuterol.  She will go back and see Dr. Baxter Flattery if it doesn't improve.  Smoking cessation recommended.  She is due for restaging in July and I have placed orders for this as well.  She is also due for her next echocardiogram and I placed orders today.  Return to clinic every 3 weeks for labs, f/u and Kadcyla.     Orders Placed This Encounter  Procedures  . CT Chest W Contrast    Standing Status:   Future    Standing Expiration Date:   12/30/2020    Order Specific Question:   If indicated for the ordered procedure, I authorize the administration of contrast media per Radiology protocol    Answer:   Yes    Order Specific Question:   Is patient pregnant?    Answer:   No    Order Specific Question:   Preferred imaging location?    Answer:   [facility]    Order Specific Question:   Radiology Contrast Protocol - do NOT remove file path    Answer:   \\[email or web address]\CTProtocols.pdf  . CT Abdomen Pelvis W Contrast  Standing Status:   Future    Standing Expiration Date:   12/30/2020    Order Specific Question:   If indicated for the ordered procedure, I authorize the administration of contrast media per Radiology protocol    Answer:   Yes    Order Specific Question:   Is patient pregnant?    Answer:   No    Order Specific Question:   Preferred imaging location?    Answer:   [facility]    Order Specific Question:   Is Oral Contrast requested for this exam?    Answer:   Yes, Per Radiology protocol    Order Specific Question:   Radiology Contrast Protocol - do NOT remove file path    Answer:   \\[email or web address]\CTProtocols.pdf  . NM Bone Scan Whole Body    Standing Status:   Future    Standing Expiration Date:   12/30/2020    Order Specific Question:   If indicated for the ordered procedure, I authorize the administration of a radiopharmaceutical per Radiology protocol    Answer:   Yes    Order Specific Question:   Is the patient pregnant?     Answer:   No    Order Specific Question:   Preferred imaging location?    Answer:   [facility]    Order Specific Question:   Radiology Contrast Protocol - do NOT remove file path    Answer:   \\[email or web address]\Radiant\NMPROTOCOLS.pdf  . DG Chest 2 View    Standing Status:   Future    Standing Expiration Date:   12/30/2020    Order Specific Question:   Reason for Exam (SYMPTOM  OR DIAGNOSIS REQUIRED)    Answer:   persistent cough    Order Specific Question:   Is patient pregnant?    Answer:   No    Order Specific Question:   Preferred imaging location?    Answer:   [facility]    Order Specific Question:   Radiology Contrast Protocol - do NOT remove file path    Answer:   \\[email or web address]\Radiant\DXFluoroContrastProtocols.pdf  . ECHOCARDIOGRAM COMPLETE    Standing Status:   Future    Standing Expiration Date:   12/30/2020    Order Specific Question:   Where should this test be performed    Answer:   [place]    Order Specific Question:   Perflutren DEFINITY (image enhancing agent) should be administered unless hypersensitivity or allergy exist    Answer:   Administer Perflutren    Order Specific Question:   Reason for exam-Echo    Answer:   Chemotherapy evaluation  v87.41 / v58.11    All questions were answered. The patient knows to call the clinic with any problems, questions or concerns. We can certainly see the patient much sooner if necessary.  Total encounter time: 20 minutes*  Wilber Bihari, NP 12/31/19 12:26 PM Medical Oncology and Hematology [facility] [place], [place] [address] Tel. [phone]    Fax. [phone]  *Total Encounter Time as defined by the Centers for Medicare and Medicaid Services includes, in addition to the face-to-face time of a patient visit (documented in the note above) non-face-to-face time: obtaining and reviewing outside history, ordering and reviewing medications, tests or procedures,  care coordination (communications with other health care professionals or caregivers) and documentation in the medical record.

## 2019-12-31 NOTE — Assessment & Plan Note (Addendum)
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 Anti Her 2 therapy with Lapatinibstarted 09/17/2017-05/14/2019: Stopped for progression 3.I discontinuedtamoxifen and started her on letrozole 2.5 mg daily.05/14/2019 stopped for progression 4.Stereotactic radiosurgery 12/19/2017 to the new right parietal lobe metastases. -------------------------------------------------------------------------------------------------------------------- Liver Biopsy 06/05/19: Metastatic cancer, ER/PR: 0%, Her 2: 3+ Positive, Ki 67: 20% Patient has metastases to liver, bone, brain, and questionably lung  Current treatment: Kadcyla every 3 weeks Echo on3/17/2021shows EF of 60-65% CT CAP: 10/22/2019: Right hepatic lobe lesion 2.5 x 2.1 cm decreased from 7 x 6.4 cm.  Stable left upper lobe and left lower lobe cavitary lesions.  Stable sclerotic area involving body of sternum. Bone scan 10/23/2019: Skeletal metastases left third rib and midline sternum.  No additional lesions. MRI brain 11/04/2019: stable, repeat MRI in 3 months.  Kadcyla toxicities: She has mild anemia.  This is stable and likely treatment related.  We are monitoring this.  Her wheezing and cough is likely related to previous infection.  I placed orders for chest xray, sent in tussionex and refills on her albuterol.  She will go back and see Dr. Baxter Flattery if it doesn't improve.  Smoking cessation recommended.  She is due for restaging in July and I have placed orders for this as well.  She is also due for her next  echocardiogram and I placed orders today.  Return to clinic every 3 weeks for labs, f/u and Kadcyla.

## 2019-12-31 NOTE — Patient Instructions (Addendum)
[facility] Discharge Instructions for Patients Receiving Chemotherapy  Today you received the following immunotherapy agent: ado-Trastuzumab (Kadcyla)  To help prevent nausea and vomiting after your treatment, we encourage you to take your nausea medication as directed by your MD.   If you develop nausea and vomiting that is not controlled by your nausea medication, call the clinic.   BELOW ARE SYMPTOMS THAT SHOULD BE REPORTED IMMEDIATELY:  *FEVER GREATER THAN 100.5 F  *CHILLS WITH OR WITHOUT FEVER  NAUSEA AND VOMITING THAT IS NOT CONTROLLED WITH YOUR NAUSEA MEDICATION  *UNUSUAL SHORTNESS OF BREATH  *UNUSUAL BRUISING OR BLEEDING  TENDERNESS IN MOUTH AND THROAT WITH OR WITHOUT PRESENCE OF ULCERS  *URINARY PROBLEMS  *BOWEL PROBLEMS  UNUSUAL RASH Items with * indicate a potential emergency and should be followed up as soon as possible.  Feel free to call the clinic should you have any questions or concerns. The clinic phone number is [phone].  Please show the [place] at check-in to the Emergency Department and triage nurse.

## 2020-01-01 ENCOUNTER — Telehealth: Payer: Self-pay | Admitting: Adult Health

## 2020-01-01 NOTE — Telephone Encounter (Signed)
No 6/22 los. No changes made to pt's schedule.  

## 2020-01-02 ENCOUNTER — Telehealth: Payer: Self-pay | Admitting: Radiation Oncology

## 2020-01-02 NOTE — Telephone Encounter (Signed)
Per patient's request I faxed [place] patient's completed disability paperwork again. I faxed the paperwork to [facility] at [phone]. Fax confirmation of delivery obtained. Responded to patient's email that this had been done.

## 2020-01-07 ENCOUNTER — Other Ambulatory Visit (HOSPITAL_COMMUNITY): Payer: PRIVATE HEALTH INSURANCE

## 2020-01-17 ENCOUNTER — Ambulatory Visit (HOSPITAL_COMMUNITY)
Admission: RE | Admit: 2020-01-17 | Discharge: 2020-01-17 | Disposition: A | Discharge status: Home / Self Care | Payer: PRIVATE HEALTH INSURANCE | Source: Ambulatory Visit | Attending: Adult Health | Admitting: Adult Health

## 2020-01-17 ENCOUNTER — Other Ambulatory Visit: Payer: Self-pay

## 2020-01-17 DIAGNOSIS — C50212 Malignant neoplasm of upper-inner quadrant of left female breast: Secondary | ICD-10-CM | POA: Insufficient documentation

## 2020-01-17 DIAGNOSIS — R059 Cough, unspecified: Secondary | ICD-10-CM

## 2020-01-17 DIAGNOSIS — Z01818 Encounter for other preprocedural examination: Secondary | ICD-10-CM | POA: Diagnosis present

## 2020-01-17 DIAGNOSIS — R05 Cough: Secondary | ICD-10-CM | POA: Insufficient documentation

## 2020-01-17 DIAGNOSIS — J449 Chronic obstructive pulmonary disease, unspecified: Secondary | ICD-10-CM | POA: Insufficient documentation

## 2020-01-17 DIAGNOSIS — C171 Malignant neoplasm of jejunum: Secondary | ICD-10-CM | POA: Diagnosis not present

## 2020-01-17 DIAGNOSIS — J069 Acute upper respiratory infection, unspecified: Secondary | ICD-10-CM | POA: Diagnosis not present

## 2020-01-17 DIAGNOSIS — Z171 Estrogen receptor negative status [ER-]: Secondary | ICD-10-CM

## 2020-01-17 DIAGNOSIS — C50912 Malignant neoplasm of unspecified site of left female breast: Secondary | ICD-10-CM | POA: Insufficient documentation

## 2020-01-17 DIAGNOSIS — C7931 Secondary malignant neoplasm of brain: Secondary | ICD-10-CM | POA: Diagnosis not present

## 2020-01-17 DIAGNOSIS — F17200 Nicotine dependence, unspecified, uncomplicated: Secondary | ICD-10-CM | POA: Diagnosis not present

## 2020-01-17 NOTE — Progress Notes (Signed)
°  Echocardiogram 2D Echocardiogram has been performed.  Melissa [name] 01/17/2020, 11:51 AM

## 2020-01-19 NOTE — Progress Notes (Signed)
Patient Care Team: Patient, [name] as PCP - General (General Practice) [name], MD as Referring Physician (Emergency Medicine)  DIAGNOSIS:    ICD-10-CM   1. Malignant neoplasm of upper-inner quadrant of left breast in female, estrogen receptor negative ([place])  C50.212    Z17.1   2. 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 Ki-67 was 70% HER-2 was amplified with a ratio 2.91   07/12/2012 - 07/17/2013 Neo-Adjuvant Chemotherapy   TCH 6 followed by Herceptin maintenance   12/11/2012 Surgery   Left breast lumpectomy: 1.8 cm tumor 1 positive sentinel node, axillary lymph node dissection 02/08/2013 showed 0/13 lymph nodes   03/25/2013 - 05/06/2013 Radiation Therapy   Adjuvant radiation therapy   06/05/2013 - 07/20/2017 Anti-estrogen oral therapy   Tamoxifen 20 mg daily   07/27/2017 Relapse/Recurrence   MRI Brain: 3.4 x 2.9 x 2.9 cm RIGHT frontal lobe mass with imaging characteristics of solitary metastasis. Extensive vasogenic edema resulting in 9 mm RIGHT to LEFT midline shift. Equivocal very early LEFT ventricle entrapment.    08/04/2017 Surgery   Rt frontal brain resection: Poorly differentiated tumor IHC suggests breast primary ER and PR Positive   08/25/2017 - 09/04/2017 Radiation Therapy   Stereotactic radiation   09/18/2017 -  Anti-estrogen oral therapy   Lapatinib with letrozole   12/18/2017 - 12/19/2017 Radiation Therapy   New right parietal lobe metastases status post [facility]   05/09/2019 Relapse/Recurrence   Interval increase in size of the enhancing nodular left internal mammary soft tissue 2.8 cm.  Redemonstrated enlarged supraclavicular, lower cervical and lower posterior cervical  nodes unchanged.  Interval increase in the bony erosion of the posterior and lateral left third rib, increasing soft tissue lesion eroding the left sternal body 3.1 cm was 2.5 cm.  Bronchiectatic changes   06/19/2019 -  Chemotherapy   The patient had ado-trastuzumab emtansine (KADCYLA) 180 mg in sodium chloride 0.9 % 250 mL chemo infusion, 3.6 mg/kg = 180 mg, Intravenous, Once, 10 of 13 cycles Administration: 180 mg (06/19/2019), 180 mg (07/10/2019), 180 mg (09/17/2019), 180 mg (08/05/2019), 180 mg (08/27/2019), 180 mg (10/08/2019), 180 mg (10/29/2019), 180 mg (11/19/2019), 200 mg (12/10/2019), 200 mg (12/31/2019)  for chemotherapy treatment.    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      CHIEF COMPLIANT: Follow-up of metastatic breast cancer  INTERVAL HISTORY: Melissa [name] is a 54 y.o. with above-mentioned history of metastatic breast cancer with brain metastasiss/presection who is currently [place] every 3 weeks.Echo on 01/17/20 showed an ejection fraction of 60-65%. Mammogram on 12/31/19 showed no evidence of malignancy bilaterally. She presents to the clinic todayfortreatment.  She complains of being tired very easily.  However she does  not want to receive any blood transfusions.  ALLERGIES:  is allergic to aspirin, protonix [pantoprazole], and iodinated diagnostic agents.  MEDICATIONS:  Current Outpatient Medications  Medication Sig Dispense Refill  . albuterol (VENTOLIN HFA)  108 (90 Base) MCG/ACT inhaler Inhale 2 puffs into the lungs every 6 (six) hours as needed. 8 g 5  . budesonide-formoterol (SYMBICORT) 160-4.5 MCG/ACT inhaler Inhale 2 puffs into the lungs 2 (two) times daily. 3 Inhaler 3  . chlorpheniramine-HYDROcodone (TUSSIONEX) 10-8 MG/5ML SUER Take 5 mLs by mouth every 12 (twelve) hours as needed for cough. 240 mL 0  . CRESEMBA 186 MG CAPS TAKE 2 CAPSULES BY MOUTH EVERY 24 HOURS 56 capsule 2  . levalbuterol (XOPENEX) 0.63 MG/3ML nebulizer solution Take 3 mLs (0.63 mg total) by nebulization every 4 (four) hours as needed for wheezing or shortness of breath. 540 mL 6  . lidocaine-prilocaine (EMLA) cream Apply to affected area once 30 g 3  . Melatonin 5 MG TABS Take 5 mg by mouth at bedtime as needed.    . naproxen sodium (ALEVE) 220 MG tablet Take 440 mg by mouth 2 (two) times daily as needed (headache).     . predniSONE (DELTASONE) 50 MG tablet Take 1 tablet 13 hours, 7 hours, and 1 hour prior to contrast 3 tablet 4   No current facility-administered medications for this visit.    PHYSICAL EXAMINATION: ECOG PERFORMANCE STATUS: 1 - Symptomatic but completely ambulatory  Vitals:   01/20/20 0933  BP: 103/68  Pulse: 76  Resp: 18  Temp: 98.9 F (37.2 C)  SpO2: 99%   Filed Weights   01/20/20 0933  Weight: 116 lb 8 oz (52.8 kg)    LABORATORY DATA:  I have reviewed the data as listed CMP Latest Ref Rng & Units 12/31/2019 12/10/2019 11/19/2019  Glucose 70 - 99 mg/dL 97 97 83  BUN 6 - 20 mg/dL _0 Creatinine 0.44 - 1.00 mg/dL 1.05(H) 0.95 1.04(H)  Sodium 135 - 145 mmol/L 140 137 139  Potassium 3.5 - 5.1 mmol/L 4.6 3.9 4.7  Chloride 98 - 111 mmol/L 104 102 104  CO2 22 - 32 mmol/L _1 Calcium 8.9 - 10.3 mg/dL 9.7 9.7 9.9  Total Protein 6.5 - 8.1 g/dL 8.0 7.7 8.5(H)  Total Bilirubin 0.3 - 1.2 mg/dL <0.2(L) <0.2(L) <0.2(L)  Alkaline Phos 38 - 126 U/L 116 101 109  AST 15 - 41 U/L 15 13(L) 22  ALT 0 - 44 U/L _2 Lab Results  Component  Value Date   WBC 10.8 (H) 01/20/2020   HGB 8.4 (L) 01/20/2020   HCT 27.0 (L) 01/20/2020   MCV 86.3 01/20/2020   PLT 440 (H) 01/20/2020   NEUTROABS 5.8 01/20/2020    ASSESSMENT & PLAN:  Breast cancer of upper-inner quadrant of left female breast ([place]) 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 Anti Her 2 therapy with Lapatinibstarted 09/17/2017-05/14/2019: Stopped for progression 3.I discontinuedtamoxifen and started her on letrozole 2.5 mg daily.05/14/2019 stopped for progression 4.Stereotactic radiosurgery 12/19/2017 to the new right parietal lobe metastases. -------------------------------------------------------------------------------------------------------------------- Liver Biopsy 06/05/19: Metastatic cancer, ER/PR: 0%, Her 2: 3+  Positive, Ki 67: 20% Patient has metastases to liver, bone, brain, and questionably lung  Current treatment: Kadcyla every 3 weeks, today is cycle 11 Kadcyla toxicities: 1.  Occasional diarrhea 2. mild peripheral neuropathy 3.  Normocytic anemia: Monitoring.  She does feel tired from the anemia but we decided to hold off on blood transfusion at this time.  Bone metastases: Because of dental issues bisphosphonates were not started Scans have been scheduled for 01/29/2020 Return to clinic every 3 weeks for Kadcyla every 6 weeks to follow-up    No orders of the defined types were placed in this encounter.  The patient has a good understanding of the overall plan. she agrees with it. she will call with any  problems that may develop before the next visit here.  Total time spent: 30 mins including face to face time and time spent for planning, charting and coordination of care  Nicholas Lose, MD 01/20/2020  I, Cloyde Reams Dorshimer, am acting as scribe for Dr. Nicholas Lose.  I have reviewed the above documentation for accuracy and completeness, and I agree with the above.

## 2020-01-20 ENCOUNTER — Other Ambulatory Visit (HOSPITAL_COMMUNITY): Payer: PRIVATE HEALTH INSURANCE

## 2020-01-20 ENCOUNTER — Inpatient Hospital Stay: Payer: PRIVATE HEALTH INSURANCE | Attending: Medical

## 2020-01-20 ENCOUNTER — Other Ambulatory Visit: Payer: Self-pay

## 2020-01-20 ENCOUNTER — Inpatient Hospital Stay (HOSPITAL_BASED_OUTPATIENT_CLINIC_OR_DEPARTMENT_OTHER): Payer: PRIVATE HEALTH INSURANCE | Admitting: Hematology and Oncology

## 2020-01-20 ENCOUNTER — Inpatient Hospital Stay: Payer: PRIVATE HEALTH INSURANCE

## 2020-01-20 VITALS — BP 103/68 | HR 76 | Temp 98.9°F | Resp 18 | Ht 62.0 in | Wt 116.5 lb

## 2020-01-20 DIAGNOSIS — Z171 Estrogen receptor negative status [ER-]: Secondary | ICD-10-CM

## 2020-01-20 DIAGNOSIS — C50912 Malignant neoplasm of unspecified site of left female breast: Secondary | ICD-10-CM

## 2020-01-20 DIAGNOSIS — C50212 Malignant neoplasm of upper-inner quadrant of left female breast: Secondary | ICD-10-CM | POA: Insufficient documentation

## 2020-01-20 DIAGNOSIS — Z452 Encounter for adjustment and management of vascular access device: Secondary | ICD-10-CM | POA: Insufficient documentation

## 2020-01-20 DIAGNOSIS — C7951 Secondary malignant neoplasm of bone: Secondary | ICD-10-CM | POA: Diagnosis not present

## 2020-01-20 DIAGNOSIS — C787 Secondary malignant neoplasm of liver and intrahepatic bile duct: Secondary | ICD-10-CM | POA: Insufficient documentation

## 2020-01-20 DIAGNOSIS — D649 Anemia, unspecified: Secondary | ICD-10-CM | POA: Insufficient documentation

## 2020-01-20 DIAGNOSIS — Z170 Estrogen receptor positive status [ER+]: Secondary | ICD-10-CM | POA: Diagnosis not present

## 2020-01-20 DIAGNOSIS — Z5112 Encounter for antineoplastic immunotherapy: Secondary | ICD-10-CM | POA: Insufficient documentation

## 2020-01-20 DIAGNOSIS — C7931 Secondary malignant neoplasm of brain: Secondary | ICD-10-CM | POA: Insufficient documentation

## 2020-01-20 DIAGNOSIS — Z95828 Presence of other vascular implants and grafts: Secondary | ICD-10-CM

## 2020-01-20 LAB — CBC WITH DIFFERENTIAL/PLATELET
Abs Immature Granulocytes: 0.02 10*3/uL (ref 0.00–0.07)
Basophils Absolute: 0.1 10*3/uL (ref 0.0–0.1)
Basophils Relative: 1 %
Eosinophils Absolute: 2 10*3/uL — ABNORMAL HIGH (ref 0.0–0.5)
Eosinophils Relative: 18 %
HCT: 27 % — ABNORMAL LOW (ref 36.0–46.0)
Hemoglobin: 8.4 g/dL — ABNORMAL LOW (ref 12.0–15.0)
Immature Granulocytes: 0 %
Lymphocytes Relative: 19 %
Lymphs Abs: 2 10*3/uL (ref 0.7–4.0)
MCH: 26.8 pg (ref 26.0–34.0)
MCHC: 31.1 g/dL (ref 30.0–36.0)
MCV: 86.3 fL (ref 80.0–100.0)
Monocytes Absolute: 0.9 10*3/uL (ref 0.1–1.0)
Monocytes Relative: 8 %
Neutro Abs: 5.8 10*3/uL (ref 1.7–7.7)
Neutrophils Relative %: 54 %
Platelets: 440 10*3/uL — ABNORMAL HIGH (ref 150–400)
RBC: 3.13 MIL/uL — ABNORMAL LOW (ref 3.87–5.11)
RDW: 21.7 % — ABNORMAL HIGH (ref 11.5–15.5)
WBC: 10.8 10*3/uL — ABNORMAL HIGH (ref 4.0–10.5)
nRBC: 0 % (ref 0.0–0.2)

## 2020-01-20 LAB — COMPREHENSIVE METABOLIC PANEL
ALT: 15 U/L (ref 0–44)
AST: 20 U/L (ref 15–41)
Albumin: 2.9 g/dL — ABNORMAL LOW (ref 3.5–5.0)
Alkaline Phosphatase: 127 U/L — ABNORMAL HIGH (ref 38–126)
Anion gap: 10 (ref 5–15)
BUN: 9 mg/dL (ref 6–20)
CO2: 24 mmol/L (ref 22–32)
Calcium: 9.3 mg/dL (ref 8.9–10.3)
Chloride: 103 mmol/L (ref 98–111)
Creatinine, Ser: 0.89 mg/dL (ref 0.44–1.00)
GFR calc Af Amer: >60 mL/min (ref >60)
GFR calc non Af Amer: >60 mL/min (ref >60)
Glucose, Bld: 96 mg/dL (ref 70–99)
Potassium: 4 mmol/L (ref 3.5–5.1)
Sodium: 137 mmol/L (ref 135–145)
Total Bilirubin: <0.2 mg/dL — ABNORMAL LOW (ref 0.3–1.2)
Total Protein: 8 g/dL (ref 6.5–8.1)

## 2020-01-20 MED ORDER — SODIUM CHLORIDE 0.9% FLUSH
10.0000 mL | INTRAVENOUS | Status: DC | PRN
  Administered 2020-01-20: 10 mL
  Filled 2020-01-20: qty 10

## 2020-01-20 MED ORDER — SODIUM CHLORIDE 0.9 % IV SOLN
3.7000 mg/kg | Freq: Once | INTRAVENOUS | Status: AC
  Administered 2020-01-20: 200 mg via INTRAVENOUS
  Filled 2020-01-20: qty 10

## 2020-01-20 MED ORDER — ACETAMINOPHEN 325 MG PO TABS
ORAL_TABLET | ORAL | Status: AC
  Filled 2020-01-20: qty 2

## 2020-01-20 MED ORDER — DIPHENHYDRAMINE HCL 25 MG PO CAPS
25.0000 mg | ORAL_CAPSULE | Freq: Once | ORAL | Status: AC
  Administered 2020-01-20: 25 mg via ORAL

## 2020-01-20 MED ORDER — DIPHENHYDRAMINE HCL 25 MG PO CAPS
ORAL_CAPSULE | ORAL | Status: AC
  Filled 2020-01-20: qty 1

## 2020-01-20 MED ORDER — HEPARIN SOD (PORK) LOCK FLUSH 100 UNIT/ML IV SOLN
500.0000 [IU] | Freq: Once | INTRAVENOUS | Status: AC | PRN
  Administered 2020-01-20: 500 [IU]
  Filled 2020-01-20: qty 5

## 2020-01-20 MED ORDER — ACETAMINOPHEN 325 MG PO TABS
650.0000 mg | ORAL_TABLET | Freq: Once | ORAL | Status: AC
  Administered 2020-01-20: 650 mg via ORAL

## 2020-01-20 MED ORDER — SODIUM CHLORIDE 0.9 % IV SOLN
Freq: Once | INTRAVENOUS | Status: AC
  Filled 2020-01-20: qty 250

## 2020-01-20 NOTE — Patient Instructions (Signed)
[facility] Discharge Instructions for Patients Receiving Chemotherapy  Today you received the following immunotherapy agent: ado-Trastuzumab (Kadcyla)  To help prevent nausea and vomiting after your treatment, we encourage you to take your nausea medication as directed by your MD.   If you develop nausea and vomiting that is not controlled by your nausea medication, call the clinic.   BELOW ARE SYMPTOMS THAT SHOULD BE REPORTED IMMEDIATELY:  *FEVER GREATER THAN 100.5 F  *CHILLS WITH OR WITHOUT FEVER  NAUSEA AND VOMITING THAT IS NOT CONTROLLED WITH YOUR NAUSEA MEDICATION  *UNUSUAL SHORTNESS OF BREATH  *UNUSUAL BRUISING OR BLEEDING  TENDERNESS IN MOUTH AND THROAT WITH OR WITHOUT PRESENCE OF ULCERS  *URINARY PROBLEMS  *BOWEL PROBLEMS  UNUSUAL RASH Items with * indicate a potential emergency and should be followed up as soon as possible.  Feel free to call the clinic should you have any questions or concerns. The clinic phone number is [phone].  Please show the [place] at check-in to the Emergency Department and triage nurse.

## 2020-01-20 NOTE — Assessment & Plan Note (Signed)
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 Anti Her 2 therapy with Lapatinibstarted 09/17/2017-05/14/2019: Stopped for progression 3.I discontinuedtamoxifen and started her on letrozole 2.5 mg daily.05/14/2019 stopped for progression 4.Stereotactic radiosurgery 12/19/2017 to the new right parietal lobe metastases. -------------------------------------------------------------------------------------------------------------------- Liver Biopsy 06/05/19: Metastatic cancer, ER/PR: 0%, Her 2: 3+ Positive, Ki 67: 20% Patient has metastases to liver, bone, brain, and questionably lung  Current treatment: Kadcyla every 3 weeks, today is cycle 11 Kadcyla toxicities: 1.  Occasional diarrhea 2. mild peripheral neuropathy 3.  Normocytic anemia: Monitoring  Bone metastases: Because of dental issues bisphosphonates were not started Scans have been scheduled for 01/29/2020 Return to clinic every 3 weeks for Kadcyla every 6 weeks to follow-up

## 2020-01-22 ENCOUNTER — Telehealth: Payer: Self-pay | Admitting: Hematology and Oncology

## 2020-01-22 NOTE — Telephone Encounter (Signed)
Per 7/12 los, changed 8/24 provider appt to Dr. Lindi Adie

## 2020-01-27 ENCOUNTER — Other Ambulatory Visit: Payer: Self-pay | Admitting: *Deleted

## 2020-01-27 DIAGNOSIS — Z171 Estrogen receptor negative status [ER-]: Secondary | ICD-10-CM

## 2020-01-28 ENCOUNTER — Inpatient Hospital Stay: Payer: PRIVATE HEALTH INSURANCE

## 2020-01-28 ENCOUNTER — Other Ambulatory Visit: Payer: Self-pay | Admitting: *Deleted

## 2020-01-28 ENCOUNTER — Other Ambulatory Visit: Payer: Self-pay | Admitting: Hematology and Oncology

## 2020-01-28 ENCOUNTER — Other Ambulatory Visit: Payer: Self-pay

## 2020-01-28 ENCOUNTER — Telehealth: Payer: Self-pay | Admitting: Hematology and Oncology

## 2020-01-28 DIAGNOSIS — Z171 Estrogen receptor negative status [ER-]: Secondary | ICD-10-CM

## 2020-01-28 DIAGNOSIS — Z95828 Presence of other vascular implants and grafts: Secondary | ICD-10-CM

## 2020-01-28 DIAGNOSIS — C50912 Malignant neoplasm of unspecified site of left female breast: Secondary | ICD-10-CM

## 2020-01-28 DIAGNOSIS — C50212 Malignant neoplasm of upper-inner quadrant of left female breast: Secondary | ICD-10-CM

## 2020-01-28 DIAGNOSIS — Z5112 Encounter for antineoplastic immunotherapy: Secondary | ICD-10-CM | POA: Diagnosis not present

## 2020-01-28 LAB — CBC WITH DIFFERENTIAL (CANCER CENTER ONLY)
Abs Immature Granulocytes: 0.03 10*3/uL (ref 0.00–0.07)
Basophils Absolute: 0.1 10*3/uL (ref 0.0–0.1)
Basophils Relative: 1 %
Eosinophils Absolute: 1.8 10*3/uL — ABNORMAL HIGH (ref 0.0–0.5)
Eosinophils Relative: 18 %
HCT: 25.2 % — ABNORMAL LOW (ref 36.0–46.0)
Hemoglobin: 7.8 g/dL — ABNORMAL LOW (ref 12.0–15.0)
Immature Granulocytes: 0 %
Lymphocytes Relative: 21 %
Lymphs Abs: 2.1 10*3/uL (ref 0.7–4.0)
MCH: 25.9 pg — ABNORMAL LOW (ref 26.0–34.0)
MCHC: 31 g/dL (ref 30.0–36.0)
MCV: 83.7 fL (ref 80.0–100.0)
Monocytes Absolute: 1 10*3/uL (ref 0.1–1.0)
Monocytes Relative: 10 %
Neutro Abs: 5 10*3/uL (ref 1.7–7.7)
Neutrophils Relative %: 50 %
Platelet Count: 226 10*3/uL (ref 150–400)
RBC: 3.01 MIL/uL — ABNORMAL LOW (ref 3.87–5.11)
RDW: 21.1 % — ABNORMAL HIGH (ref 11.5–15.5)
WBC Count: 9.9 10*3/uL (ref 4.0–10.5)
nRBC: 0 % (ref 0.0–0.2)

## 2020-01-28 LAB — SAMPLE TO BLOOD BANK

## 2020-01-28 LAB — CMP (CANCER CENTER ONLY)
ALT: 13 U/L (ref 0–44)
AST: 26 U/L (ref 15–41)
Albumin: 2.8 g/dL — ABNORMAL LOW (ref 3.5–5.0)
Alkaline Phosphatase: 140 U/L — ABNORMAL HIGH (ref 38–126)
Anion gap: 10 (ref 5–15)
BUN: 7 mg/dL (ref 6–20)
CO2: 24 mmol/L (ref 22–32)
Calcium: 9.5 mg/dL (ref 8.9–10.3)
Chloride: 103 mmol/L (ref 98–111)
Creatinine: 0.9 mg/dL (ref 0.44–1.00)
GFR, Est AFR Am: >60 mL/min (ref >60)
GFR, Estimated: >60 mL/min (ref >60)
Glucose, Bld: 89 mg/dL (ref 70–99)
Potassium: 3.8 mmol/L (ref 3.5–5.1)
Sodium: 137 mmol/L (ref 135–145)
Total Bilirubin: <0.2 mg/dL — ABNORMAL LOW (ref 0.3–1.2)
Total Protein: 7.8 g/dL (ref 6.5–8.1)

## 2020-01-28 LAB — PREPARE RBC (CROSSMATCH)

## 2020-01-28 MED ORDER — SODIUM CHLORIDE 0.9% FLUSH
10.0000 mL | INTRAVENOUS | Status: DC | PRN
  Administered 2020-01-28: 10 mL
  Filled 2020-01-28: qty 10

## 2020-01-28 MED ORDER — HEPARIN SOD (PORK) LOCK FLUSH 100 UNIT/ML IV SOLN
500.0000 [IU] | Freq: Once | INTRAVENOUS | Status: AC | PRN
  Administered 2020-01-28: 500 [IU]
  Filled 2020-01-28: qty 5

## 2020-01-28 NOTE — Telephone Encounter (Signed)
Scheduled apt per 7/20 sch msg - pt is aware of appt date and time.

## 2020-01-28 NOTE — Progress Notes (Signed)
Pt Hgb 7.8.  Per MD pt to receive 2 units PRBC's.  Orders placed and high priority message sent to scheduling to schedule pt.

## 2020-01-28 NOTE — Progress Notes (Signed)
blood

## 2020-01-29 ENCOUNTER — Other Ambulatory Visit: Payer: Self-pay

## 2020-01-29 ENCOUNTER — Ambulatory Visit (HOSPITAL_COMMUNITY)
Admission: RE | Admit: 2020-01-29 | Discharge: 2020-01-29 | Disposition: A | Discharge status: Home / Self Care | Payer: PRIVATE HEALTH INSURANCE | Source: Ambulatory Visit | Attending: Adult Health | Admitting: Adult Health

## 2020-01-29 ENCOUNTER — Encounter (HOSPITAL_COMMUNITY)
Admission: RE | Admit: 2020-01-29 | Discharge: 2020-01-29 | Disposition: A | Discharge status: Home / Self Care | Payer: PRIVATE HEALTH INSURANCE | Source: Ambulatory Visit | Attending: Adult Health | Admitting: Adult Health

## 2020-01-29 DIAGNOSIS — Z171 Estrogen receptor negative status [ER-]: Secondary | ICD-10-CM | POA: Insufficient documentation

## 2020-01-29 DIAGNOSIS — C50212 Malignant neoplasm of upper-inner quadrant of left female breast: Secondary | ICD-10-CM | POA: Diagnosis not present

## 2020-01-29 DIAGNOSIS — C7931 Secondary malignant neoplasm of brain: Secondary | ICD-10-CM | POA: Diagnosis present

## 2020-01-29 DIAGNOSIS — C50912 Malignant neoplasm of unspecified site of left female breast: Secondary | ICD-10-CM | POA: Insufficient documentation

## 2020-01-29 MED ORDER — TECHNETIUM TC 99M MEDRONATE IV KIT
20.0000 | PACK | Freq: Once | INTRAVENOUS | Status: AC | PRN
  Administered 2020-01-29: 20.7 via INTRAVENOUS

## 2020-01-29 MED ORDER — SODIUM CHLORIDE (PF) 0.9 % IJ SOLN
INTRAMUSCULAR | Status: AC
  Filled 2020-01-29: qty 50

## 2020-01-29 MED ORDER — PREDNISONE 50 MG PO TABS: ORAL_TABLET | ORAL | 4 refills | Status: DC

## 2020-01-29 NOTE — Telephone Encounter (Signed)
Called pt to speak with her about contrast dye allergy prep. Pt did not take prescribed prednisone before scan this morning; therefore did not have scan and has been r/s for 7/27 @ 0730. Per Wilber Bihari, NP, Prednisone has been sent to pharmacy with instructions to take 13 hours, 7 hours, then 1 hour prior to appt. This nurse explained to pt how to take this medication, and to also take Benadryl 50 mg 1 hour prior to appt. Pt also understands she should be finished drinking oral contrast 2 hours before appt. Pt states she will pick up contrast today, and also requests a refill on Tussionex for cough at HS. This request was given to Wilber Bihari, NP.

## 2020-01-30 ENCOUNTER — Other Ambulatory Visit: Payer: Self-pay

## 2020-01-30 ENCOUNTER — Ambulatory Visit (HOSPITAL_COMMUNITY)
Admission: RE | Admit: 2020-01-30 | Discharge: 2020-01-30 | Disposition: A | Discharge status: Home / Self Care | Payer: PRIVATE HEALTH INSURANCE | Source: Ambulatory Visit | Attending: Radiation Oncology | Admitting: Radiation Oncology

## 2020-01-30 ENCOUNTER — Inpatient Hospital Stay: Payer: PRIVATE HEALTH INSURANCE

## 2020-01-30 ENCOUNTER — Other Ambulatory Visit: Payer: Self-pay | Admitting: Adult Health

## 2020-01-30 VITALS — BP 125/73 | HR 65 | Temp 98.3°F | Resp 16 | Ht 62.0 in | Wt 116.9 lb

## 2020-01-30 DIAGNOSIS — C7931 Secondary malignant neoplasm of brain: Secondary | ICD-10-CM | POA: Insufficient documentation

## 2020-01-30 DIAGNOSIS — C50912 Malignant neoplasm of unspecified site of left female breast: Secondary | ICD-10-CM

## 2020-01-30 DIAGNOSIS — Z171 Estrogen receptor negative status [ER-]: Secondary | ICD-10-CM

## 2020-01-30 DIAGNOSIS — Z5112 Encounter for antineoplastic immunotherapy: Secondary | ICD-10-CM | POA: Diagnosis not present

## 2020-01-30 DIAGNOSIS — Z95828 Presence of other vascular implants and grafts: Secondary | ICD-10-CM

## 2020-01-30 MED ORDER — ALTEPLASE 2 MG IJ SOLR
2.0000 mg | Freq: Once | INTRAMUSCULAR | Status: AC | PRN
  Administered 2020-01-30: 2 mg
  Filled 2020-01-30: qty 2

## 2020-01-30 MED ORDER — HEPARIN SOD (PORK) LOCK FLUSH 100 UNIT/ML IV SOLN
500.0000 [IU] | INTRAVENOUS | Status: AC | PRN
  Administered 2020-01-30: 500 [IU]
  Filled 2020-01-30: qty 5

## 2020-01-30 MED ORDER — HYDROCOD POLST-CPM POLST ER 10-8 MG/5ML PO SUER: 5.0000 mL | Freq: Two times a day (BID) | ORAL | 0 refills | Status: DC | PRN

## 2020-01-30 MED ORDER — SODIUM CHLORIDE 0.9% FLUSH
10.0000 mL | INTRAVENOUS | Status: DC | PRN
  Administered 2020-01-30: 10 mL
  Filled 2020-01-30: qty 10

## 2020-01-30 MED ORDER — ALTEPLASE 2 MG IJ SOLR
INTRAMUSCULAR | Status: AC
  Filled 2020-01-30: qty 2

## 2020-01-30 MED ORDER — ACETAMINOPHEN 325 MG PO TABS
650.0000 mg | ORAL_TABLET | Freq: Once | ORAL | Status: AC
  Administered 2020-01-30: 650 mg via ORAL

## 2020-01-30 MED ORDER — GADOBUTROL 1 MMOL/ML IV SOLN
5.0000 mL | Freq: Once | INTRAVENOUS | Status: AC | PRN
  Administered 2020-01-30: 5 mL via INTRAVENOUS

## 2020-01-30 MED ORDER — SODIUM CHLORIDE 0.9% IV SOLUTION
250.0000 mL | Freq: Once | INTRAVENOUS | Status: AC
  Administered 2020-01-30: 250 mL via INTRAVENOUS
  Filled 2020-01-30: qty 250

## 2020-01-30 MED ORDER — DIPHENHYDRAMINE HCL 25 MG PO CAPS
25.0000 mg | ORAL_CAPSULE | Freq: Once | ORAL | Status: AC
  Administered 2020-01-30: 25 mg via ORAL

## 2020-01-30 MED ORDER — HEPARIN SOD (PORK) LOCK FLUSH 100 UNIT/ML IV SOLN
500.0000 [IU] | Freq: Once | INTRAVENOUS | Status: AC | PRN
  Administered 2020-01-30: 500 [IU]
  Filled 2020-01-30: qty 5

## 2020-01-30 NOTE — Patient Instructions (Signed)

## 2020-01-30 NOTE — Progress Notes (Signed)
Pt request for port to remain accessed for MRI with contrast scheduled today. Huber needle changed to power lock and bio patch placed. Pt tolerated well.

## 2020-01-31 ENCOUNTER — Telehealth: Payer: Self-pay

## 2020-01-31 ENCOUNTER — Other Ambulatory Visit: Payer: Self-pay | Admitting: Hematology and Oncology

## 2020-01-31 DIAGNOSIS — C50912 Malignant neoplasm of unspecified site of left female breast: Secondary | ICD-10-CM

## 2020-01-31 DIAGNOSIS — C50212 Malignant neoplasm of upper-inner quadrant of left female breast: Secondary | ICD-10-CM

## 2020-01-31 LAB — BPAM RBC
Blood Product Expiration Date: 202108122359
Blood Product Expiration Date: 202108122359
ISSUE DATE / TIME: 202107221024
ISSUE DATE / TIME: 202107221024
Unit Type and Rh: 5100
Unit Type and Rh: 5100

## 2020-01-31 LAB — TYPE AND SCREEN
ABO/RH(D): O POS
Antibody Screen: NEGATIVE
Unit division: 0
Unit division: 0

## 2020-01-31 MED ORDER — HYDROCOD POLST-CPM POLST ER 10-8 MG/5ML PO SUER: 5.0000 mL | Freq: Two times a day (BID) | ORAL | 0 refills | Status: DC | PRN

## 2020-01-31 NOTE — Telephone Encounter (Signed)
Pt called and LVM requesting refill for Tussionex for cough. Dr Lindi Adie fulfilled this request and sent in Rx. Called pt to inform, LVM.

## 2020-02-03 ENCOUNTER — Other Ambulatory Visit: Payer: PRIVATE HEALTH INSURANCE

## 2020-02-04 ENCOUNTER — Telehealth: Payer: Self-pay

## 2020-02-04 ENCOUNTER — Other Ambulatory Visit: Payer: Self-pay

## 2020-02-04 ENCOUNTER — Encounter: Payer: Self-pay | Admitting: Urology

## 2020-02-04 ENCOUNTER — Ambulatory Visit (HOSPITAL_COMMUNITY)
Admission: RE | Admit: 2020-02-04 | Discharge: 2020-02-04 | Disposition: A | Discharge status: Home / Self Care | Payer: PRIVATE HEALTH INSURANCE | Source: Ambulatory Visit | Attending: Adult Health | Admitting: Adult Health

## 2020-02-04 DIAGNOSIS — C7931 Secondary malignant neoplasm of brain: Secondary | ICD-10-CM | POA: Diagnosis present

## 2020-02-04 DIAGNOSIS — C50912 Malignant neoplasm of unspecified site of left female breast: Secondary | ICD-10-CM | POA: Insufficient documentation

## 2020-02-04 DIAGNOSIS — C50212 Malignant neoplasm of upper-inner quadrant of left female breast: Secondary | ICD-10-CM | POA: Insufficient documentation

## 2020-02-04 DIAGNOSIS — Z171 Estrogen receptor negative status [ER-]: Secondary | ICD-10-CM | POA: Insufficient documentation

## 2020-02-04 MED ORDER — HEPARIN SOD (PORK) LOCK FLUSH 100 UNIT/ML IV SOLN
500.0000 [IU] | Freq: Once | INTRAVENOUS | Status: AC
  Administered 2020-02-04: 500 [IU] via INTRAVENOUS

## 2020-02-04 MED ORDER — IOHEXOL 300 MG/ML  SOLN
100.0000 mL | Freq: Once | INTRAMUSCULAR | Status: AC | PRN
  Administered 2020-02-04: 100 mL via INTRAVENOUS

## 2020-02-04 NOTE — Telephone Encounter (Signed)
Spoke with patient in regards to telephone appointment with Freeman Caldron PA on 02/05/20 at 2:30pm. Patient verbalized understanding of appointment date and time. Meaningful use questions were reviewed.

## 2020-02-05 ENCOUNTER — Ambulatory Visit
Admission: RE | Admit: 2020-02-05 | Discharge: 2020-02-05 | Disposition: A | Discharge status: Home / Self Care | Payer: PRIVATE HEALTH INSURANCE | Source: Ambulatory Visit | Attending: Urology | Admitting: Urology

## 2020-02-05 ENCOUNTER — Telehealth: Payer: Self-pay | Admitting: Adult Health

## 2020-02-05 DIAGNOSIS — C7931 Secondary malignant neoplasm of brain: Secondary | ICD-10-CM

## 2020-02-05 NOTE — Progress Notes (Signed)
Radiation Oncology         [phone] ________________________________  Name: Melissa [name] MRN: [id number]  Date: 02/05/2020  DOB: 01-01-1966  Post Treatment Note  CC: Patient, [name]  [name], Loura Halt, *  Diagnosis:   54 y.o. 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  Interval Since Last Radiation:  10 months 03/25/2019:  SRS brain//PTV 5:  Right frontal 5 mm target was treated to a prescription dose of 20 Gy in a single fraction.   09/04/2018:   SRS Brain// Right Frontal, 2 targets / 20 Gy in 1 fraction PTV3: Ant Rt Frontal 64mm 20Gy PTV4: Rt Frontal resection cavity 7mm  20Gy  12/18/2017: SRS brain//PTV2: 4 mm Rt Parietal lesion treated to 20 Gy in 1 Fx  08/25/2017, 08/28/2017, 08/30/2017, 09/01/2017, 09/04/2017: PTV1: post op SRS to right frontal lobe resection cavity in 5 fxs  Narrative:  I spoke with the patient to conduct her routine scheduled 3 month posttreatment follow up visit via telephone to spare the patient unnecessary potential exposure in the healthcare setting during the current COVID-19 pandemic.  The patient was notified in advance and gave permission to proceed with this visit format.  In summary, she initially presented to the emergency Department on 07/27/2017 with complaints of headaches ongoing for approximately 5 weeks with associated nausea and vomiting. She was initially treated by her PCP for sinusitis without improvement.  She also has a history of migraine headaches and had ben evaluated in the ED at [facility] on 06/20/2017 for treatment of what she thought was a typical migraine headache given the fact that she had her usual blurry vision and aura preceding the headache. She reports that the headache did improve with treatment but the relief was short-lived as the headache returned the very next day. She followed up with her primary care physician and a CT of the head  was going to be scheduled due to the persistent headaches but in the interim, she presented to the Emergency Department at [facility] due to increased severity of the headache with associated nausea and vomiting. The headaches were occuring in the frontal lobe as well as bilateral temporal with occasional radiation to the occipital lobe and associated with blurry vision, decreased appetite, fatigue, nausea and vomiting.  She denied any difficulty with speech, memory, imbalance or focal weakness.  CT Head on admission 07/27/2017 showed a large right frontal lobe mass measuring 3.1 by 2.5 cm with significant surrounding edema with mass effect and right to left midline shift measuring 8 mm. A subsequent Brain MRI showed a 3.4 x 2.9 x 2.9 cm right frontal lobe mass with imaging characteristics of solitary metastasis and extensive vasogenic edema resulting in 9 mm right to left midline shift as well as equivocal very early left ventricle entrapment.   CT Chest, Abdomen, Pelvis on 07/28/2017 for disease staging showed no findings to suggest metastatic breast cancer involving the chest, abdomen, pelvis or osseous structures. 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 Surgical changes involving the left breast and left axilla but no findings for local recurrence or regional adenopathy.  She proceeded with a gross total resection of the frontal lesion on 08/05/17 with Dr. Bevely Palmer followed by adjuvant fractionated postop SRS radiotherapy to the resection cavity in February 2019. Final surgical pathology revealed poorly differentiated adenocarcinoma consistent with metastatic breast cancer, ER/PR positive and HER-2 positive.  She tolerated SRS treatment well and initial post treatment MRI brain on 12/07/17 showed satisfactory appearance of the right anterior frontal lobe post treatment site with expected evolution but unfortunately also showed a single, new 2 - 3 mm lesion in  the right parietal lobe without associated edema or mass-effect.  She elected to undergo salvage SRS treatment to the new lesion in the parietal lobe which was completed on 12/18/17 and tolerated well.  A follow up brain MRI on 05/18/18 showed a stable right frontal resection cavity with stable 4 mm nodular enhancement at the inferior cavity margin but no evidence of new disease.  Unfortunately, the follow up MRI brain scan on 08/23/2018 showed a new 11 mm inferior right frontal lesion with only mild associated edema and no mass-effect.  She reported that she had been having more frequent frontal headaches which were not severe and responded to Aleve and otherwise, had been feeling well.  She elected to proceed with a single fraction of SRS to this new brain metastasis as recommended and this was completed on  09/04/18 and tolerated very well.   She had bronchoscopy with Dr. Valeta Harms in 05/2018 and dx'ed with Aspergillus- ID following (Dr. Graylon Good).  She also continues in routine follow up with Dr. Melvyn Novas in Pulmonology.  Follow up MRI brain scan from 03/08/19 showed continued interval enlargement of the nodular focus with enhancement at the superior margin of the right frontal resection cavity, measuring 5 x 6 mm in diameter compared with 3 x 4.5 mm on the previous study. Two smaller foci of enhancement along the inferior margin were unchanged and there was no evidence of increasing mass effect or edema and no new lesions seen elsewhere within the brain.  She elected to proceed with SRS treatment of the progressive nodular lesion which was completed on 03/25/19 and tolerated well without any acute ill effects.    She developed systemic disease progression, particularly in the chest with increase in size of enhancing nodular left internal mammary soft tissue mass, lymph nodes and left third rib lesion and soft tissue lesion eroding the left sternal body as well as a new, hypermetabolic 6.5 cm liver lesion seen on PET scan  from 05/23/2019 despite continuing onlapatinibwith Letrozole. She did have an ultrasound-guided biopsy of the liver mass on 06/05/2019 which confirmed metastatic breast cancer, HER-2 positive, ER/PR negative. Her systemic therapy was changed to [place] beginning on 06/19/2019, under the care and direction of Dr. Lindi Adie which she tolerates very well.  A follow-up CT chest from 07/19/2019 showed a positive response to treatment with decrease in size of the soft tissue mass in the breast as well as bony lesions and adenopathy.  Fortunately, her follow-up MRI brain from 08/02/2019 showed stability of the previously treated disease and no new or progressive findings. A repeat MRI brain scan 11/04/19 was felt to be overall stable but there was a slight change in a previously treated right inferior frontal lesion, measuring 5 mm, previously 4 mm, with a new, separate punctate focus of enhancement just posterior to that. This was such minimal change and when her prior treatment fields were fused with recent scan, it appeared that the new area of enhancement was on the field border of GTV3 and would have received a large portion of the delivered dose.  Therefore, consensus recommendation was to simply monitor this area with routine 3 month follow up brain imaging. She had repeat MRI on 01/30/20 and today's visit is scheduled to review those results  and recommendations from recent CNS tumor board.   Interval History:   She has remained clinically stable since the time of her last SRS treatment and is currently without complaints.  She continues to tolerate her systemic therapy with Elvera Maria very well aside from fatigue. Her most recent MRI brain scan from 01/30/20 shows overall disease stability with an unchanged appearance of the 2 previously treated right frontal lobe deposits and no new or progressive disease.  As far as the systemic disease, she was recently evaluated with Dr. Lindi Adie on 01/20/20 with repeat disease staging  scans scheduled for 01/29/20 and 02/04/20.  The bone scan on 01/29/20 showed less uptake of tracer at the previously identified sternal and LEFT third rib lesions, consistent with response to therapy and there were no new sites of osseous metastasis noted. The CT C/A/P performed on 02/04/20 disease stability with a new cluster of spiculated nodule or opacities in the posterior left lung favoring aspergillosis or other atypical infectious etiology.  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  There was no definite evidence of metastatic disease within the abdomen or pelvis. She has completed 11 cycles of Kadcycla to date and the plan is to continue on this systemic therapy under the care and direction of Dr. Lindi Adie.  On review of systems, the patient states that she is doing well overall.  She is without complaints aside from fatigue, particularly the day of systemic therapy, and chronic cough.  She has continued with improved energy levels and has been able to continue to work and has recently been helping her parents move to a retirement facility. She denies headaches, decreased visual or auditory acuity, tinnitus, tremor, focal weakness or seizure activity. She is not having difficulty with speech or word finding.  She denies abdominal pain, N/V or diarrhea.  She continues on the Kadcycla systemic therapy and is tolerating this well. She reports a healthy appetite and is maintaining her weight which she is pleased with.  ALLERGIES:  is allergic to aspirin, protonix [pantoprazole], and iodinated diagnostic agents.  Meds: Current Outpatient Medications  Medication Sig Dispense Refill  . albuterol (VENTOLIN HFA) 108 (90 Base) MCG/ACT inhaler Inhale 2 puffs into the lungs every 6 (six) hours as needed. 8 g 5  . budesonide-formoterol (SYMBICORT) 160-4.5  MCG/ACT inhaler Inhale 2 puffs into the lungs 2 (two) times daily. 3 Inhaler 3  . chlorpheniramine-HYDROcodone (TUSSIONEX) 10-8 MG/5ML SUER Take 5 mLs by mouth every 12 (twelve) hours as needed for cough. 240 mL 0  . CRESEMBA 186 MG CAPS TAKE 2 CAPSULES BY MOUTH EVERY 24 HOURS 56 capsule 2  . levalbuterol (XOPENEX) 0.63 MG/3ML nebulizer solution Take 3 mLs (0.63 mg total) by nebulization every 4 (four) hours as needed for wheezing or shortness of breath. 540 mL 6  . lidocaine-prilocaine (EMLA) cream Apply to affected area once 30 g 3  . Melatonin 5 MG TABS Take 5 mg by mouth at bedtime as needed.    . naproxen sodium (ALEVE) 220 MG tablet Take 440 mg by mouth 2 (two) times daily as needed (headache).     . predniSONE (DELTASONE) 50 MG tablet Take 1 tablet 13 hours, 7 hours, and 1 hour prior to contrast 3 tablet 4   No current facility-administered medications for this encounter.    Physical Findings:  vitals were not taken for this  visit.   Karen Kays to assess due to virtual/telephone visit format.  Lab Findings: Lab Results  Component Value Date   WBC 9.9 01/28/2020   HGB 7.8 (L) 01/28/2020   HCT 25.2 (L) 01/28/2020   MCV 83.7 01/28/2020   PLT 226 01/28/2020     Radiographic Findings: CT Chest W Contrast  Result Date: 02/04/2020 CLINICAL DATA:  Follow-up metastatic breast carcinoma. Ongoing chemotherapy. Previous radiation therapy, left breast lumpectomy, and left lung lobectomy. EXAM: CT CHEST, ABDOMEN, AND PELVIS WITH CONTRAST TECHNIQUE: Multidetector CT imaging of the chest, abdomen and pelvis was performed following the standard protocol during bolus administration of intravenous contrast. CONTRAST:  163m OMNIPAQUE IOHEXOL 300 MG/ML  SOLN COMPARISON:  10/18/2019 FINDINGS: CT CHEST FINDINGS Cardiovascular: No acute findings. Mediastinum/Lymph Nodes: No masses or pathologically enlarged lymph nodes identified. Lungs/Pleura: Mild centrilobular emphysema again noted. Stable biapical  pleural-parenchymal scarring and postop changes from prior left upper lobectomy. An elongated nodular density is again seen in the anterior right lower lobe on image 88/4 which is unchanged since previous study. Multiple cavitary lesions and associated bronchiectasis are again seen in the left upper and midlung, without significant change. A new 1.4 cm soft tissue nodule is seen in 1 of the largest areas of cavitation in the upper left lung on image 54/4, consistent with a fungus ball. There is also associated cluster of several spiculated nodular opacities in the posterior left lung with largest measuring 1.2 x 0.8 cm (e.g. Image 64/4). These favor aspergillosis or other atypical infectious etiology over neoplasm. No evidence of pleural effusion. Musculoskeletal: Sclerotic bone lesion involving the sternal body shows no significant change. No other suspicious bone lesions identified. CT ABDOMEN AND PELVIS FINDINGS Hepatobiliary: No definite hepatic masses are identified. Ill-defined area of decreased attenuation adjacent to the gallbladder fossa is stable and suggestive of focal fatty infiltration. Gallbladder is unremarkable. No evidence of biliary obstruction. Pancreas:  No mass or inflammatory changes. Spleen:  Within normal limits in size and appearance. Adrenals/Urinary tract:  No masses or hydronephrosis. Stomach/Bowel: No evidence of obstruction, inflammatory process, or abnormal fluid collections. Diverticulosis is seen mainly involving the sigmoid colon, however there is no evidence of diverticulitis. Vascular/Lymphatic: No pathologically enlarged lymph nodes identified. No abdominal aortic aneurysm. Aortic atherosclerosis noted. Reproductive:  No mass or other significant abnormality identified. Other:  None. Musculoskeletal:  No suspicious bone lesions identified. IMPRESSION: Stable cavitary lesions and associated bronchiectasis in left upper and midlung, with new mycetoma/fungus ball in the largest area  of cavitation in the left upper lung. New cluster of several spiculated nodular opacities in posterior left lung, which favor aspergillosis or other atypical infectious etiology over neoplasm. Stable sclerotic bone lesion involving the sternum. Probable focal fatty infiltration of the liver remains stable. No definite evidence of metastatic disease within the abdomen or pelvis. Colonic diverticulosis, without radiographic evidence of diverticulitis. Aortic Atherosclerosis (ICD10-I70.0) and Emphysema (ICD10-J43.9). Electronically Signed   By: JMarlaine HindM.D.   On: 02/04/2020 08:49   MR Brain W Wo Contrast  Result Date: 02/01/2020 CLINICAL DATA:  CNS neoplasm follow-up. Metastatic breast cancer treated with SRS. EXAM: MRI HEAD WITHOUT AND WITH CONTRAST TECHNIQUE: Multiplanar, multiecho pulse sequences of the brain and surrounding structures were obtained without and with intravenous contrast. CONTRAST:  559mGADAVIST GADOBUTROL 1 MMOL/ML IV SOLN COMPARISON:  11/04/2019 FINDINGS: BRAIN New Lesions: None. Larger lesions: None. Stable or Smaller lesions: Postoperative lesion in the superior right frontal lobe has unchanged wispy and nodular enhancement at the margins of  the resection, with smooth overlying dural thickening. 5 mm mm enhancing lesion located in the inferior right frontal lobe and seen on 9:83. A smaller nodular focus just posterior to the measured nodule is also unchanged. Other Brain findings: No recent or interval infarct, hemorrhage, hydrocephalus, or collection. Vascular: Normal flow voids and vascular enhancement Skull and upper cervical spine: Normal marrow signal. Unremarkable frontal craniotomy. Sinuses/Orbits: Negative IMPRESSION: Two treated right frontal lobe deposits are unchanged. No new or progressive disease. Electronically Signed   By: Monte Fantasia M.D.   On: 02/01/2020 15:10   NM Bone Scan Whole Body  Result Date: 01/30/2020 CLINICAL DATA:  LEFT breast cancer, assess response  to treatment, had brain tumor removed 2019, last chemotherapy treatment 01/21/2020, radiation in 2020 EXAM: [place] SCAN TECHNIQUE: Whole body anterior and posterior images were obtained approximately 3 hours after intravenous injection of radiopharmaceutical. RADIOPHARMACEUTICALS:  20.7 mCi Technetium-75mMDP IV COMPARISON:  10/23/2019 Correlation: CT chest abdomen pelvis 10/18/2019 FINDINGS: Less uptake is identified at the previous sites of metastasis at the mid sternum and at the LEFT third rib. Near resolution of calvarial uptake at site of prior craniotomy. No new sites of abnormal osseous tracer accumulation are seen to suggest progressive osseous metastasis. Significant retained tracer within urinary bladder obscuring portions of the anterior pelvis. Expected soft tissue distribution of tracer. IMPRESSION: Less uptake of tracer is seen at the previously identified sternal and LEFT third rib lesions consistent with response to therapy. No new scintigraphic abnormalities. Electronically Signed   By: MLavonia DanaM.D.   On: 01/30/2020 16:09   CT Abdomen Pelvis W Contrast  Result Date: 02/04/2020 CLINICAL DATA:  Follow-up metastatic breast carcinoma. Ongoing chemotherapy. Previous radiation therapy, left breast lumpectomy, and left lung lobectomy. EXAM: CT CHEST, ABDOMEN, AND PELVIS WITH CONTRAST TECHNIQUE: Multidetector CT imaging of the chest, abdomen and pelvis was performed following the standard protocol during bolus administration of intravenous contrast. CONTRAST:  1061mOMNIPAQUE IOHEXOL 300 MG/ML  SOLN COMPARISON:  10/18/2019 FINDINGS: CT CHEST FINDINGS Cardiovascular: No acute findings. Mediastinum/Lymph Nodes: No masses or pathologically enlarged lymph nodes identified. Lungs/Pleura: Mild centrilobular emphysema again noted. Stable biapical pleural-parenchymal scarring and postop changes from prior left upper lobectomy. An elongated nodular density is again seen in the  anterior right lower lobe on image 88/4 which is unchanged since previous study. Multiple cavitary lesions and associated bronchiectasis are again seen in the left upper and midlung, without significant change. A new 1.4 cm soft tissue nodule is seen in 1 of the largest areas of cavitation in the upper left lung on image 54/4, consistent with a fungus ball. There is also associated cluster of several spiculated nodular opacities in the posterior left lung with largest measuring 1.2 x 0.8 cm (e.g. Image 64/4). These favor aspergillosis or other atypical infectious etiology over neoplasm. No evidence of pleural effusion. Musculoskeletal: Sclerotic bone lesion involving the sternal body shows no significant change. No other suspicious bone lesions identified. CT ABDOMEN AND PELVIS FINDINGS Hepatobiliary: No definite hepatic masses are identified. Ill-defined area of decreased attenuation adjacent to the gallbladder fossa is stable and suggestive of focal fatty infiltration. Gallbladder is unremarkable. No evidence of biliary obstruction. Pancreas:  No mass or inflammatory changes. Spleen:  Within normal limits in size and appearance. Adrenals/Urinary tract:  No masses or hydronephrosis. Stomach/Bowel: No evidence of obstruction, inflammatory process, or abnormal fluid collections. Diverticulosis is seen mainly involving the sigmoid colon, however there is no evidence of diverticulitis. Vascular/Lymphatic: No  pathologically enlarged lymph nodes identified. No abdominal aortic aneurysm. Aortic atherosclerosis noted. Reproductive:  No mass or other significant abnormality identified. Other:  None. Musculoskeletal:  No suspicious bone lesions identified. 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 New cluster of several spiculated nodular opacities in posterior left lung, which favor  aspergillosis or other atypical infectious etiology over neoplasm. Stable sclerotic bone lesion involving the sternum. Probable focal fatty infiltration of the liver remains stable. No definite evidence of metastatic disease within the abdomen or pelvis. Colonic diverticulosis, without radiographic evidence of diverticulitis. Aortic Atherosclerosis (ICD10-I70.0) and Emphysema (ICD10-J43.9). Electronically Signed   By: Danae Orleans M.D.   On: 02/04/2020 08:49   ECHOCARDIOGRAM COMPLETE  Result Date: 01/18/2020    ECHOCARDIOGRAM REPORT   Patient Name:   ANETTE BARRA [facility] Date of Exam: 01/17/2020 Medical Rec #:  [id number]     Height:       62.0 in Accession #:    [id number]    Weight:       117.6 lb Date of Birth:  1965-08-09     BSA:          1.525 m Patient Age:    53 years      BP:           118/75 mmHg Patient Gender: F             HR:           76 bpm. Exam Location:  Outpatient Procedure: 2D Echo Indications:    chemotherapy evaluation v87.41  History:        Patient has prior history of Echocardiogram examinations, most                 recent 11/25/2019. COPD; Risk Factors:Current Smoker. Breast                 cancer.  Sonographer:    Celene Skeen RDCS (AE) Referring Phys: [id number] LINDSEY CORNETTO CAUSEY IMPRESSIONS  1. Technically difficult study. Left ventricular ejection fraction, by estimation, is 60 to 65%. The left ventricle has normal function. The left ventricle has no regional wall motion abnormalities. Left ventricular diastolic parameters were normal. The  average left ventricular global longitudinal strain is -15.6 %, which is decreased from prior study (-19.3%). However there was poor tracking of the endocardium on the current study due to poor quality apical images, which affects the accuracy of the global longitudinal strain  2. Right ventricular systolic function is normal. The right ventricular size is normal. Tricuspid regurgitation signal is inadequate for assessing PA pressure.  3. The mitral  valve is normal in structure. No evidence of mitral valve regurgitation.  4. The aortic valve is tricuspid. Aortic valve regurgitation is not visualized. No aortic stenosis is present.  5. The inferior vena cava is dilated in size with >50% respiratory variability, suggesting right atrial pressure of 8 mmHg. FINDINGS  Left Ventricle: Left ventricular ejection fraction, by estimation, is 60 to 65%. The left ventricle has normal function. The left ventricle has no regional wall motion abnormalities. The average left ventricular global longitudinal strain is -15.6 %. The left ventricular internal cavity size was normal in size. There is no left ventricular hypertrophy. Left ventricular diastolic parameters were normal. Right Ventricle: The right ventricular size is normal. No increase in right ventricular wall thickness. Right ventricular systolic function is normal. Tricuspid regurgitation  signal is inadequate for assessing PA pressure. Left Atrium: Left atrial size was not well visualized. Right Atrium: Right atrial size was not well visualized. Pericardium: Trivial pericardial effusion is present. Mitral Valve: The mitral valve is normal in structure. No evidence of mitral valve regurgitation. Tricuspid Valve: The tricuspid valve is normal in structure. Tricuspid valve regurgitation is trivial. Aortic Valve: The aortic valve is tricuspid. Aortic valve regurgitation is not visualized. No aortic stenosis is present. Pulmonic Valve: The pulmonic valve was not well visualized. Pulmonic valve regurgitation is not visualized. Aorta: The aortic root is normal in size and structure. Venous: The inferior vena cava is dilated in size with greater than 50% respiratory variability, suggesting right atrial pressure of 8 mmHg. IAS/Shunts: The interatrial septum was not well visualized.  LEFT VENTRICLE PLAX 2D LVIDd:         4.30 cm  Diastology LVIDs:         2.60 cm  LV e' lateral:   8.49 cm/s LV PW:         0.80 cm  LV E/e'  lateral: 9.2 LV IVS:        0.70 cm  LV e' medial:    9.36 cm/s LVOT diam:     1.90 cm  LV E/e' medial:  8.3 LV SV:         34 LV SV Index:   22       2D Longitudinal Strain LVOT Area:     2.84 cm 2D Strain GLS Avg:     -15.6 %  LEFT ATRIUM         Index LA diam:    3.20 cm 2.10 cm/m  AORTIC VALVE LVOT Vmax:   62.50 cm/s LVOT Vmean:  39.500 cm/s LVOT VTI:    0.120 m  AORTA Ao Root diam: 2.90 cm MITRAL VALVE MV Area (PHT): 3.89 cm    SHUNTS MV Decel Time: 195 msec    Systemic VTI:  0.12 m MV E velocity: 77.70 cm/s  Systemic Diam: 1.90 cm MV A velocity: 76.00 cm/s MV E/A ratio:  1.02 Oswaldo Milian MD Electronically signed by Oswaldo Milian MD Signature Date/Time: 01/18/2020/12:48:03 AM    Final     Impression/Plan: 1. 54 y.o. 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 She has recovered well from her recent [facility] treatment and is currently without CNS complaints.  Her most recent brain MRI from 01/30/20 shows overall disease stability with an unchanged appearance of the 2 previously treated right frontal lobe deposits and no new or progressive disease. The recommendation is to continue with serial brain MRI scans every 3 months with follow up thereafter to review results and recommendations from the brain tumor board.  She will also continue in routine follow up with Dr. Lindi Adie for continued management of her systemic disease. Based on her most recent systemic imaging from 01/29/20 and 02/04/20, it appears that she is having an excellent response to the [place] treatment with resolution of the liver metastases and stable to improved disease in the lungs and bone. The plan at present is to continue her current treatment regimen every 3 weeks and follow up visits every 6 weeks under the direction of Dr. Lindi Adie. She appears to have a good understanding of her disease and our recommendations and is comfortable and in  agreement with the current plan as stated. She knows to call with any questions or concerns in  the interim.  Given current concerns for patient exposure during the COVID-19 pandemic, this encounter was conducted via telephone. The patient was notified in advance and was offered a [employer] meeting to allow for face to face communication but unfortunately reported that she did not have the appropriate resources/technology to support such a visit and instead preferred to proceed with telephone follow up visit. The patient has given verbal consent for this type of encounter. The time spent during this encounter was 15 minutes with 50% of time spent in coordination of care. The attendants for this meeting include Chelsea Pedretti PA-C, and patient, Jemya Depierro. During the encounter, Dahir Ayer PA-C was located at [facility] Radiation Oncology Department.  Patient, Kallan Bischoff was located at home.    Nicholos Johns, PA-C

## 2020-02-05 NOTE — Telephone Encounter (Signed)
Called and reviewed scans with Claiborne Billings which show good control of her cancer.  Her infection seems to be worsening in her lung.  Scans have been forwarded to Dr. Baxter Flattery to review.  Melissa [name] understands this.  Dr. Lindi Adie has reviewed scans as well and is aware of plan.  Wilber Bihari, NP

## 2020-02-10 ENCOUNTER — Inpatient Hospital Stay: Payer: PRIVATE HEALTH INSURANCE | Attending: Medical

## 2020-02-10 DIAGNOSIS — Z5112 Encounter for antineoplastic immunotherapy: Secondary | ICD-10-CM | POA: Insufficient documentation

## 2020-02-10 DIAGNOSIS — Z170 Estrogen receptor positive status [ER+]: Secondary | ICD-10-CM | POA: Insufficient documentation

## 2020-02-10 DIAGNOSIS — C7951 Secondary malignant neoplasm of bone: Secondary | ICD-10-CM | POA: Insufficient documentation

## 2020-02-10 DIAGNOSIS — C787 Secondary malignant neoplasm of liver and intrahepatic bile duct: Secondary | ICD-10-CM | POA: Insufficient documentation

## 2020-02-10 DIAGNOSIS — C50212 Malignant neoplasm of upper-inner quadrant of left female breast: Secondary | ICD-10-CM | POA: Insufficient documentation

## 2020-02-10 DIAGNOSIS — C7931 Secondary malignant neoplasm of brain: Secondary | ICD-10-CM | POA: Insufficient documentation

## 2020-02-10 NOTE — Progress Notes (Signed)
Patient Care Team: Patient, [name] as PCP - General (General Practice) [name], MD as Referring Physician (Emergency Medicine)  DIAGNOSIS:    ICD-10-CM   1. 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[facility])  C50.912    C79.31   2. Malignant neoplasm of upper-inner quadrant of left breast in female, estrogen receptor negative ([place])  C50.212    Z17.1     SUMMARY OF ONCOLOGIC HISTORY: Oncology History  Breast cancer of upper-inner quadrant of left female breast ([place])  06/08/2012 Initial Diagnosis   invasive ductal carcinoma that was ER positive PR positive HER-2/neu positive measuring 3.1 cm by MRI criteria. Ki-67 was 70% HER-2 was amplified with a ratio 2.91   07/12/2012 - 07/17/2013 Neo-Adjuvant Chemotherapy   TCH 6 followed by Herceptin maintenance   12/11/2012 Surgery   Left breast lumpectomy: 1.8 cm tumor 1 positive sentinel node, axillary lymph node dissection 02/08/2013 showed 0/13 lymph nodes   03/25/2013 - 05/06/2013 Radiation Therapy   Adjuvant radiation therapy   06/05/2013 - 07/20/2017 Anti-estrogen oral therapy   Tamoxifen 20 mg daily   07/27/2017 Relapse/Recurrence   MRI Brain: 3.4 x 2.9 x 2.9 cm RIGHT frontal lobe mass with imaging characteristics of solitary metastasis. Extensive vasogenic edema resulting in 9 mm RIGHT to LEFT midline shift. Equivocal very early LEFT ventricle entrapment.    08/04/2017 Surgery   Rt frontal brain resection: Poorly differentiated tumor IHC suggests breast primary ER and PR Positive   08/25/2017 - 09/04/2017 Radiation Therapy   Stereotactic radiation   09/18/2017 -  Anti-estrogen oral therapy   Lapatinib with letrozole   12/18/2017 - 12/19/2017 Radiation Therapy   New right parietal lobe metastases status post [facility]   05/09/2019 Relapse/Recurrence   Interval increase in size of the enhancing nodular left internal mammary soft tissue 2.8 cm.  Redemonstrated enlarged supraclavicular, lower cervical and lower posterior cervical  nodes unchanged.  Interval increase in the bony erosion of the posterior and lateral left third rib, increasing soft tissue lesion eroding the left sternal body 3.1 cm was 2.5 cm.  Bronchiectatic changes   06/19/2019 -  Chemotherapy   The patient had ado-trastuzumab emtansine (KADCYLA) 180 mg in sodium chloride 0.9 % 250 mL chemo infusion, 3.6 mg/kg = 180 mg, Intravenous, Once, 11 of 13 cycles Administration: 180 mg (06/19/2019), 180 mg (07/10/2019), 180 mg (09/17/2019), 180 mg (08/05/2019), 180 mg (08/27/2019), 180 mg (10/08/2019), 180 mg (10/29/2019), 180 mg (11/19/2019), 200 mg (12/10/2019), 200 mg (12/31/2019), 200 mg (01/20/2020)  for chemotherapy treatment.    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[facility])  06/10/2019 Initial Diagnosis   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[facility])   06/19/2019 -  Chemotherapy   The patient had ado-trastuzumab emtansine (KADCYLA) 180 mg in sodium chloride 0.9 % 250 mL chemo infusion, 3.6 mg/kg = 180 mg, Intravenous, Once, 11 of 13 cycles Administration: 180 mg (06/19/2019), 180 mg (07/10/2019), 180 mg (09/17/2019), 180 mg (08/05/2019), 180 mg (08/27/2019), 180 mg (10/08/2019), 180 mg (10/29/2019), 180 mg (11/19/2019), 200 mg (12/10/2019), 200 mg (12/31/2019), 200 mg (01/20/2020)  for chemotherapy treatment.      CHIEF COMPLIANT: Follow-up of metastatic breast cancer  INTERVAL HISTORY: Melissa [name] is a 54 y.o. with above-mentioned history of metastatic breast cancer with brain metastasiss/presection who is currently [place] every 3 weeks.Bone scan on 01/29/20 showed less uptake in the sternal and left third rib lesions, consistent with response to therapy, and no new lesions. Brain MRI on 01/30/20 showed unchanged  treated lesions and no new or progressive disease. CT CAP on 02/04/20 showed no definitve evidence of metastatic disease. She presents to the clinic todayfortreatment and to review her scans.  She complains of chronic dry cough.  She received 2 units of PRBC  last time and she feels so much better today.     ALLERGIES:  is allergic to aspirin, protonix [pantoprazole], and iodinated diagnostic agents.  MEDICATIONS:  Current Outpatient Medications  Medication Sig Dispense Refill  . albuterol (VENTOLIN HFA) 108 (90 Base) MCG/ACT inhaler Inhale 2 puffs into the lungs every 6 (six) hours as needed. 8 g 5  . budesonide-formoterol (SYMBICORT) 160-4.5 MCG/ACT inhaler Inhale 2 puffs into the lungs 2 (two) times daily. 3 Inhaler 3  . chlorpheniramine-HYDROcodone (TUSSIONEX) 10-8 MG/5ML SUER Take 5 mLs by mouth every 12 (twelve) hours as needed for cough. 240 mL 0  . CRESEMBA 186 MG CAPS TAKE 2 CAPSULES BY MOUTH EVERY 24 HOURS 56 capsule 2  . levalbuterol (XOPENEX) 0.63 MG/3ML nebulizer solution Take 3 mLs (0.63 mg total) by nebulization every 4 (four) hours as needed for wheezing or shortness of breath. 540 mL 6  . lidocaine-prilocaine (EMLA) cream Apply to affected area once 30 g 3  . Melatonin 5 MG TABS Take 5 mg by mouth at bedtime as needed.    . naproxen sodium (ALEVE) 220 MG tablet Take 440 mg by mouth 2 (two) times daily as needed (headache).     . predniSONE (DELTASONE) 50 MG tablet Take 1 tablet 13 hours, 7 hours, and 1 hour prior to contrast 3 tablet 4   No current facility-administered medications for this visit.    PHYSICAL EXAMINATION: ECOG PERFORMANCE STATUS: 1 - Symptomatic but completely ambulatory  Vitals:   02/11/20 0845  BP: 117/72  Pulse: 77  Resp: 20  Temp: 98.2 F (36.8 C)  SpO2: 98%   Filed Weights   02/11/20 0845  Weight: 114 lb 12.8 oz (52.1 kg)    LABORATORY DATA:  I have reviewed the data as listed CMP Latest Ref Rng & Units 01/28/2020 01/20/2020 12/31/2019  Glucose 70 - 99 mg/dL 89 96 97  BUN 6 - 20 mg/dL '7 9 8  '$ Creatinine 0.44 - 1.00 mg/dL 0.90 0.89 1.05(H)  Sodium 135 - 145 mmol/L 137 137 140  Potassium 3.5 - 5.1 mmol/L 3.8 4.0 4.6  Chloride 98 - 111 mmol/L 103 103 104  CO2 22 - 32 mmol/L '24 24 25  '$ Calcium  8.9 - 10.3 mg/dL 9.5 9.3 9.7  Total Protein 6.5 - 8.1 g/dL 7.8 8.0 8.0  Total Bilirubin 0.3 - 1.2 mg/dL <0.2(L) <0.2(L) <0.2(L)  Alkaline Phos 38 - 126 U/L 140(H) 127(H) 116  AST 15 - 41 U/L '26 20 15  '$ ALT 0 - 44 U/L '13 15 12    '$ Lab Results  Component Value Date   WBC 10.2 02/11/2020   HGB 11.8 (L) 02/11/2020   HCT 37.1 02/11/2020   MCV 86.1 02/11/2020   PLT 303 02/11/2020   NEUTROABS 5.5 02/11/2020    ASSESSMENT & PLAN:  Breast cancer of upper-inner quadrant of left female breast ([place]) Left breast invasive ductal carcinoma ER/PR positive HER-2 positive initially 3.1 cm, Ki-67 70%, HER-2 amplified ratio 2.91 status post neoadjuvant chemotherapy followed by surgery which showed 1.8 cm tumor 1 positive sentinel lymph node T1cN1 M0 stage IB status post radiation therapy and Herceptin maintenanceand tooktamoxifen 06/05/2013-08/11/2017  Brain Metastasis: S/P resection of frontal lobe metER PR positive, HER-2 positive  Summary: 1.SRSbrain:  08/25/2017-09/04/2017 2. Anti Her 2 therapy with Lapatinibstarted 09/17/2017-05/14/2019: Stopped for progression 3.I discontinuedtamoxifen and started her on letrozole 2.5 mg daily.05/14/2019 stopped for progression 4.Stereotactic radiosurgery 12/19/2017 to the new right parietal lobe metastases. -------------------------------------------------------------------------------------------------------------------- Liver Biopsy 06/05/19: Metastatic cancer, ER/PR: 0%, Her 2: 3+ Positive, Ki 67: 20% Patient has metastases to liver, bone, brain, and questionably lung  Current treatment: Kadcyla every 3 weeks, today is cycle11 Kadcyla toxicities: 1.  Occasional diarrhea 2. mild peripheral neuropathy 3.  Normocytic anemia: Monitoring.  She does feel tired from the anemia but we decided to hold off on blood transfusion at this time.  Bone metastases: Because of dental issues bisphosphonates were not started CT CAP 02/04/2020: Stable cavitary  lesions and bronchiectasis left lung with new mycetoma/fungal ball left upper lung.  New cluster of spiculated nodular opacities posterior left lung aspergillosis.  Stable bone lesion within the sternum.  I discussed with the patient that overall her scans appear to show that the breast cancer is doing quite well.  We have informed her about the changes in the lungs related to aspergillosis I sent a message to Dr. Karolee Ohs to evaluate her scan and decide on the treatment plan with regards to aspergillosis.  Return to clinic every 3 weeks for Kadcyla every 6 weeks to follow-up    No orders of the defined types were placed in this encounter.  The patient has a good understanding of the overall plan. she agrees with it. she will call with any problems that may develop before the next visit here.  Total time spent: 30 mins including face to face time and time spent for planning, charting and coordination of care  Melissa Lose, MD 02/11/2020  I, Cloyde Reams Dorshimer, am acting as scribe for Dr. Nicholas [name].  I have reviewed the above documentation for accuracy and completeness, and I agree with the above.

## 2020-02-11 ENCOUNTER — Inpatient Hospital Stay: Payer: PRIVATE HEALTH INSURANCE

## 2020-02-11 ENCOUNTER — Other Ambulatory Visit: Payer: Self-pay

## 2020-02-11 ENCOUNTER — Inpatient Hospital Stay (HOSPITAL_BASED_OUTPATIENT_CLINIC_OR_DEPARTMENT_OTHER): Payer: PRIVATE HEALTH INSURANCE | Admitting: Hematology and Oncology

## 2020-02-11 VITALS — BP 108/59 | HR 65 | Temp 97.9°F | Resp 17

## 2020-02-11 VITALS — BP 117/72 | HR 77 | Temp 98.2°F | Resp 20 | Wt 114.8 lb

## 2020-02-11 DIAGNOSIS — C7931 Secondary malignant neoplasm of brain: Secondary | ICD-10-CM | POA: Diagnosis not present

## 2020-02-11 DIAGNOSIS — Z171 Estrogen receptor negative status [ER-]: Secondary | ICD-10-CM

## 2020-02-11 DIAGNOSIS — Z170 Estrogen receptor positive status [ER+]: Secondary | ICD-10-CM | POA: Diagnosis not present

## 2020-02-11 DIAGNOSIS — C50912 Malignant neoplasm of unspecified site of left female breast: Secondary | ICD-10-CM | POA: Diagnosis not present

## 2020-02-11 DIAGNOSIS — C7951 Secondary malignant neoplasm of bone: Secondary | ICD-10-CM | POA: Diagnosis not present

## 2020-02-11 DIAGNOSIS — C787 Secondary malignant neoplasm of liver and intrahepatic bile duct: Secondary | ICD-10-CM | POA: Diagnosis present

## 2020-02-11 DIAGNOSIS — C50212 Malignant neoplasm of upper-inner quadrant of left female breast: Secondary | ICD-10-CM | POA: Diagnosis not present

## 2020-02-11 DIAGNOSIS — Z95828 Presence of other vascular implants and grafts: Secondary | ICD-10-CM

## 2020-02-11 DIAGNOSIS — Z5112 Encounter for antineoplastic immunotherapy: Secondary | ICD-10-CM | POA: Diagnosis not present

## 2020-02-11 LAB — CBC WITH DIFFERENTIAL (CANCER CENTER ONLY)
Abs Immature Granulocytes: 0.02 10*3/uL (ref 0.00–0.07)
Basophils Absolute: 0.1 10*3/uL (ref 0.0–0.1)
Basophils Relative: 1 %
Eosinophils Absolute: 2 10*3/uL — ABNORMAL HIGH (ref 0.0–0.5)
Eosinophils Relative: 19 %
HCT: 37.1 % (ref 36.0–46.0)
Hemoglobin: 11.8 g/dL — ABNORMAL LOW (ref 12.0–15.0)
Immature Granulocytes: 0 %
Lymphocytes Relative: 18 %
Lymphs Abs: 1.9 10*3/uL (ref 0.7–4.0)
MCH: 27.4 pg (ref 26.0–34.0)
MCHC: 31.8 g/dL (ref 30.0–36.0)
MCV: 86.1 fL (ref 80.0–100.0)
Monocytes Absolute: 0.8 10*3/uL (ref 0.1–1.0)
Monocytes Relative: 8 %
Neutro Abs: 5.5 10*3/uL (ref 1.7–7.7)
Neutrophils Relative %: 54 %
Platelet Count: 303 10*3/uL (ref 150–400)
RBC: 4.31 MIL/uL (ref 3.87–5.11)
RDW: 19.7 % — ABNORMAL HIGH (ref 11.5–15.5)
WBC Count: 10.2 10*3/uL (ref 4.0–10.5)
nRBC: 0 % (ref 0.0–0.2)

## 2020-02-11 LAB — CMP (CANCER CENTER ONLY)
ALT: 14 U/L (ref 0–44)
AST: 19 U/L (ref 15–41)
Albumin: 3.1 g/dL — ABNORMAL LOW (ref 3.5–5.0)
Alkaline Phosphatase: 143 U/L — ABNORMAL HIGH (ref 38–126)
Anion gap: 8 (ref 5–15)
BUN: 9 mg/dL (ref 6–20)
CO2: 25 mmol/L (ref 22–32)
Calcium: 9.9 mg/dL (ref 8.9–10.3)
Chloride: 102 mmol/L (ref 98–111)
Creatinine: 0.89 mg/dL (ref 0.44–1.00)
GFR, Est AFR Am: >60 mL/min (ref >60)
GFR, Estimated: >60 mL/min (ref >60)
Glucose, Bld: 102 mg/dL — ABNORMAL HIGH (ref 70–99)
Potassium: 3.7 mmol/L (ref 3.5–5.1)
Sodium: 135 mmol/L (ref 135–145)
Total Bilirubin: <0.2 mg/dL — ABNORMAL LOW (ref 0.3–1.2)
Total Protein: 8.1 g/dL (ref 6.5–8.1)

## 2020-02-11 MED ORDER — SODIUM CHLORIDE 0.9 % IV SOLN
200.0000 mg | Freq: Once | INTRAVENOUS | Status: AC
  Administered 2020-02-11: 200 mg via INTRAVENOUS
  Filled 2020-02-11: qty 10

## 2020-02-11 MED ORDER — DIPHENHYDRAMINE HCL 25 MG PO CAPS
25.0000 mg | ORAL_CAPSULE | Freq: Once | ORAL | Status: AC
  Administered 2020-02-11: 25 mg via ORAL

## 2020-02-11 MED ORDER — DIPHENHYDRAMINE HCL 25 MG PO CAPS
ORAL_CAPSULE | ORAL | Status: AC
  Filled 2020-02-11: qty 1

## 2020-02-11 MED ORDER — SODIUM CHLORIDE 0.9 % IV SOLN
Freq: Once | INTRAVENOUS | Status: AC
  Filled 2020-02-11: qty 250

## 2020-02-11 MED ORDER — ACETAMINOPHEN 325 MG PO TABS
ORAL_TABLET | ORAL | Status: AC
  Filled 2020-02-11: qty 2

## 2020-02-11 MED ORDER — SODIUM CHLORIDE 0.9% FLUSH
10.0000 mL | INTRAVENOUS | Status: DC | PRN
  Administered 2020-02-11: 10 mL
  Filled 2020-02-11: qty 10

## 2020-02-11 MED ORDER — HEPARIN SOD (PORK) LOCK FLUSH 100 UNIT/ML IV SOLN
500.0000 [IU] | Freq: Once | INTRAVENOUS | Status: AC | PRN
  Administered 2020-02-11: 500 [IU]
  Filled 2020-02-11: qty 5

## 2020-02-11 MED ORDER — ACETAMINOPHEN 325 MG PO TABS
650.0000 mg | ORAL_TABLET | Freq: Once | ORAL | Status: AC
  Administered 2020-02-11: 650 mg via ORAL

## 2020-02-11 NOTE — Patient Instructions (Signed)
[facility] Discharge Instructions for Patients Receiving Chemotherapy  Today you received the following chemotherapy agents Kadcyla  To help prevent nausea and vomiting after your treatment, we encourage you to take your nausea medication as directed   If you develop nausea and vomiting that is not controlled by your nausea medication, call the clinic.   BELOW ARE SYMPTOMS THAT SHOULD BE REPORTED IMMEDIATELY:  *FEVER GREATER THAN 100.5 F  *CHILLS WITH OR WITHOUT FEVER  NAUSEA AND VOMITING THAT IS NOT CONTROLLED WITH YOUR NAUSEA MEDICATION  *UNUSUAL SHORTNESS OF BREATH  *UNUSUAL BRUISING OR BLEEDING  TENDERNESS IN MOUTH AND THROAT WITH OR WITHOUT PRESENCE OF ULCERS  *URINARY PROBLEMS  *BOWEL PROBLEMS  UNUSUAL RASH Items with * indicate a potential emergency and should be followed up as soon as possible.  Feel free to call the clinic should you have any questions or concerns. The clinic phone number is [phone].  Please show the CHEMO ALERT CARD at check-in to the Emergency Department and triage nurse.   

## 2020-02-11 NOTE — Assessment & Plan Note (Signed)
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 Anti Her 2 therapy with Lapatinibstarted 09/17/2017-05/14/2019: Stopped for progression 3.I discontinuedtamoxifen and started her on letrozole 2.5 mg daily.05/14/2019 stopped for progression 4.Stereotactic radiosurgery 12/19/2017 to the new right parietal lobe metastases. -------------------------------------------------------------------------------------------------------------------- Liver Biopsy 06/05/19: Metastatic cancer, ER/PR: 0%, Her 2: 3+ Positive, Ki 67: 20% Patient has metastases to liver, bone, brain, and questionably lung  Current treatment: Kadcyla every 3 weeks, today is cycle11 Kadcyla toxicities: 1.  Occasional diarrhea 2. mild peripheral neuropathy 3.  Normocytic anemia: Monitoring.  She does feel tired from the anemia but we decided to hold off on blood transfusion at this time.  Bone metastases: Because of dental issues bisphosphonates were not started CT CAP 02/04/2020: Stable cavitary lesions and bronchiectasis left lung with new mycetoma/fungal ball left upper lung.  New cluster of spiculated nodular opacities posterior left lung aspergillosis.  Stable bone lesion within the sternum.  I discussed with the patient that overall her scans appear to show that the breast cancer is doing quite well.  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  Return to clinic every 3 weeks for  Kadcyla every 6 weeks to follow-up

## 2020-02-12 ENCOUNTER — Telehealth: Payer: Self-pay | Admitting: Hematology and Oncology

## 2020-02-12 NOTE — Telephone Encounter (Signed)
No 8/3 los, no changes made to pt schedule

## 2020-02-18 ENCOUNTER — Telehealth: Payer: Self-pay | Admitting: *Deleted

## 2020-02-18 NOTE — Telephone Encounter (Signed)
Per MD pt needing to establish care with PCP for further evaluation and treatment of chest pain.  Pt also educated to go to urgent care if she experiences chest pain prior to being established with PCP.  Pt verbalized understating.

## 2020-02-18 NOTE — Telephone Encounter (Signed)
Received call from pt with complaint of an episode of chest pain.  Pt states chest pain started yesterday while sitting on the couch and describes pain as sharp/burning sensation  radiating to her neck with shortness of breath.  Pt states symptoms resolved on their own after 20 minutes.  Pt states she has experienced 2 episodes of chest pain in the past but it has been several months.  RN will review with MD for further recommendations.

## 2020-02-26 ENCOUNTER — Other Ambulatory Visit: Payer: PRIVATE HEALTH INSURANCE

## 2020-02-26 ENCOUNTER — Encounter: Payer: Self-pay | Admitting: Infectious Disease

## 2020-02-26 ENCOUNTER — Other Ambulatory Visit: Payer: Self-pay

## 2020-02-26 ENCOUNTER — Ambulatory Visit (INDEPENDENT_AMBULATORY_CARE_PROVIDER_SITE_OTHER): Payer: PRIVATE HEALTH INSURANCE | Admitting: Infectious Disease

## 2020-02-26 VITALS — BP 117/73 | HR 81 | Temp 97.5°F | Wt 116.0 lb

## 2020-02-26 DIAGNOSIS — F17210 Nicotine dependence, cigarettes, uncomplicated: Secondary | ICD-10-CM

## 2020-02-26 DIAGNOSIS — C50212 Malignant neoplasm of upper-inner quadrant of left female breast: Secondary | ICD-10-CM

## 2020-02-26 DIAGNOSIS — J984 Other disorders of lung: Secondary | ICD-10-CM

## 2020-02-26 DIAGNOSIS — Z8619 Personal history of other infectious and parasitic diseases: Secondary | ICD-10-CM

## 2020-02-26 DIAGNOSIS — J479 Bronchiectasis, uncomplicated: Secondary | ICD-10-CM | POA: Diagnosis not present

## 2020-02-26 DIAGNOSIS — B479 Mycetoma, unspecified: Secondary | ICD-10-CM | POA: Diagnosis not present

## 2020-02-26 DIAGNOSIS — B449 Aspergillosis, unspecified: Secondary | ICD-10-CM

## 2020-02-26 DIAGNOSIS — Z171 Estrogen receptor negative status [ER-]: Secondary | ICD-10-CM

## 2020-02-26 HISTORY — DX: Aspergillosis, unspecified: B44.9

## 2020-02-26 HISTORY — DX: Mycetoma, unspecified: B47.9

## 2020-02-26 NOTE — Progress Notes (Signed)
Subjective:  Complaint: Sore throat and hoarse voice   Patient ID: Melissa [name], female    DOB: 1965-07-30, 54 y.o.   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  Along the way she was diagnosed with breast cancer 2013 underwent lumpectomy and chemotherapy.  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  She is following with Dr. Lindi Adie with oncology.  She is subsequent been found to have other metastases including to her sternum and liver.  She developed cavitary lung infection and underwent bronchoscopy which revealed Aspergillus fumigatus.  He has been on Cresemba since then.  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  She also has some spiculated lesions in the posterior left lobe lobe of her lung.  Radiology is concerned that the apparent mycetoma is due to Aspergillus or another mold.  There is similar concerned that the spiculated areas could represent Aspergillus though we have no culture data to guide [name].  She continues to currently remain on [place].  She does continue to smoke.  She is still hesitant about getting COVID-19 vaccinated though I emphasized how important it would be to for her to get this vaccine is one of the easiest thing she could do to protect her health.  He did suffer severe coughing bout last week which caused her have a sore throat which now is no longer sore but she is suffering from a hoarse voice.    Past Medical History:  Diagnosis Date  . Anemia   . Arthritis    knees and hips  . 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 Asthma   . Breast cancer ([place])   . Bronchiectasis ([place])   . Bronchiolitis   . Cancer [facility])    breast cancer 2014  . Cancer, metastatic to liver ([place])    2021  . Complication of anesthesia    bp dropped + desat   . COPD (chronic obstructive pulmonary disease) ([place])   . Dyspnea    DOE  . GERD (gastroesophageal reflux disease)   . H/O coccidioidomycosis    was reason for lung lobectomy  . Headache(784.0)    due to eye strain or not eating  . History of anemia    no current problem  . History of asthma    as a child  . History of breast cancer 2014   left  . History of chemotherapy    finished 07/17/2013  . History of hiatal hernia    AGE 54  . Hx of radiation therapy 03/25/13-05/06/13   left breast 5000 cGy/25 sessions, left breast boost 1000 cGy/5 sessions  . 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 Pneumonia    LAST FLARE UP 01/2018  . Runny nose 07/30/2013   clear drainage  . Wears dentures    upper    Past Surgical History:  Procedure Laterality Date  . APPLICATION OF CRANIAL NAVIGATION N/A 08/04/2017   Procedure: APPLICATION OF CRANIAL NAVIGATION;  Surgeon: [name], [name], MD;  Location: [place];  Service: Neurosurgery;  Laterality: N/A;  . AXILLARY LYMPH NODE DISSECTION Left 02/08/2013  Procedure: LEFT AXILLARY DISSECTION;  Surgeon: Edward Jolly, MD;  Location: [place];  Service: General;  Laterality: Left;  . BREAST CYST EXCISION Right 2006  . BREAST LUMPECTOMY Left 2014  . BREAST LUMPECTOMY WITH NEEDLE LOCALIZATION AND AXILLARY SENTINEL LYMPH NODE BX Left 12/31/2012   Procedure: NEEDLE LOCALIZATION LEFT BREAST LUMPECTOMY AND LEFT AXILLARY SENTENIAL LYMPH NODE BX;  Surgeon: Edward Jolly, MD;  Location: [place];  Service: General;  Laterality: Left;  . CESAREAN SECTION  1995/1996  . CRANIOTOMY Right 08/04/2017   Procedure: Right Frontal craniotomy for resection of tumor with stereotactic navigation;  Surgeon: [name],  [name], MD;  Location: [place];  Service: Neurosurgery;  Laterality: Right;  Right Frontal craniotomy for resection of tumor with stereotactic navigation  . IR IMAGING GUIDED PORT INSERTION  05/31/2019  . LUNG LOBECTOMY Left 05/1996   upper lobe - due to [facility] Fever  . PORT-A-CATH REMOVAL Right 08/02/2013   Procedure: REMOVAL PORT-A-CATH;  Surgeon: Edward Jolly, MD;  Location: [place];  Service: General;  Laterality: Right;  . PORTACATH PLACEMENT  07/02/2012   Procedure: INSERTION PORT-A-CATH;  Surgeon: Edward Jolly, MD;  Location: [place];  Service: General;  Laterality: N/A;  right  . VIDEO BRONCHOSCOPY WITH ENDOBRONCHIAL ULTRASOUND N/A 05/23/2018   Procedure: VIDEO BRONCHOSCOPY WITH ENDOBRONCHIAL ULTRASOUND;  Surgeon: Garner Nash, DO;  [facility];  Service: Thoracic;  Laterality: N/A;    Family History  Problem Relation Age of Onset  . Emphysema Mother        was a smoker  . Heart disease Mother   . Melanoma Mother        dx in her 19s  . Breast cancer Mother 50  . Asthma Brother   . Breast cancer Cousin        mother's maternal cousin; dx in her 49s      Social History   Socioeconomic History  . Marital status: Married    Spouse name: Not on file  . Number of children: 2  . Years of education: Not on file  . Highest education level: Not on file  Occupational History  . Occupation: Secondary school teacher  Tobacco Use  . Smoking status: Current Every Day Smoker    Packs/day: 0.75    Years: 24.00    Pack years: 18.00    Types: Cigarettes  . Smokeless tobacco: Never Used  Vaping Use  . Vaping Use: Never used  Substance and Sexual Activity  . Alcohol use: No  . Drug use: No  . Sexual activity: Not Currently  Other Topics Concern  . Not on file  Social History Narrative  . Not on file   Social Determinants of Health   Financial Resource Strain:   . Difficulty of Paying Living Expenses:   Food  Insecurity:   . Worried About Charity fundraiser in the Last Year:   . Arboriculturist in the Last Year:   Transportation Needs:   . Film/video editor (Medical):   Marland Kitchen Lack of Transportation (Non-Medical):   Physical Activity:   . Days of Exercise per Week:   . Minutes of Exercise per Session:   Stress:   . Feeling of Stress :   Social Connections:   . Frequency of Communication with Friends and Family:   . Frequency of Social Gatherings with Friends and Family:   . Attends Religious Services:   . Active Member  of Clubs or Organizations:   . Attends Archivist Meetings:   Marland Kitchen Marital Status:     Allergies  Allergen Reactions  . Aspirin Anaphylaxis and Shortness Of Breath    THROAT CLOSES  . Protonix [Pantoprazole] Nausea Only and Other (See Comments)    Also caused a "film in the mouth" and caused chest pressure  . Iodinated Diagnostic Agents Rash     Current Outpatient Medications:  .  albuterol (VENTOLIN HFA) 108 (90 Base) MCG/ACT inhaler, Inhale 2 puffs into the lungs every 6 (six) hours as needed., Disp: 8 g, Rfl: 5 .  budesonide-formoterol (SYMBICORT) 160-4.5 MCG/ACT inhaler, Inhale 2 puffs into the lungs 2 (two) times daily., Disp: 3 Inhaler, Rfl: 3 .  chlorpheniramine-HYDROcodone (TUSSIONEX) 10-8 MG/5ML SUER, Take 5 mLs by mouth every 12 (twelve) hours as needed for cough., Disp: 240 mL, Rfl: 0 .  CRESEMBA 186 MG CAPS, TAKE 2 CAPSULES BY MOUTH EVERY 24 HOURS, Disp: 56 capsule, Rfl: 2 .  levalbuterol (XOPENEX) 0.63 MG/3ML nebulizer solution, Take 3 mLs (0.63 mg total) by nebulization every 4 (four) hours as needed for wheezing or shortness of breath., Disp: 540 mL, Rfl: 6 .  lidocaine-prilocaine (EMLA) cream, Apply to affected area once, Disp: 30 g, Rfl: 3 .  Melatonin 5 MG TABS, Take 5 mg by mouth at bedtime as needed., Disp: , Rfl:  .  naproxen sodium (ALEVE) 220 MG tablet, Take 440 mg by mouth 2 (two) times daily as needed (headache). , Disp: , Rfl:  .   predniSONE (DELTASONE) 50 MG tablet, Take 1 tablet 13 hours, 7 hours, and 1 hour prior to contrast, Disp: 3 tablet, Rfl: 4   Review of Systems  Constitutional: Negative for activity change, appetite change, chills, diaphoresis, fatigue, fever and unexpected weight change.  HENT: Positive for sore throat. Negative for congestion, rhinorrhea, sinus pressure, sneezing and trouble swallowing.   Eyes: Negative for photophobia and visual disturbance.  Respiratory: Positive for cough and shortness of breath. Negative for chest tightness, wheezing and stridor.   Cardiovascular: Negative for chest pain, palpitations and leg swelling.  Gastrointestinal: Negative for abdominal distention, abdominal pain, anal bleeding, blood in stool, constipation, diarrhea, nausea and vomiting.  Genitourinary: Negative for difficulty urinating, dysuria, flank pain and hematuria.  Musculoskeletal: Negative for arthralgias, back pain, gait problem, joint swelling and myalgias.  Skin: Negative for color change, pallor, rash and wound.  Neurological: Negative for dizziness, tremors, weakness and light-headedness.  Hematological: Negative for adenopathy. Does not bruise/bleed easily.  Psychiatric/Behavioral: Negative for agitation, behavioral problems, confusion, decreased concentration, dysphoric mood and sleep disturbance.       Objective:   Physical Exam Constitutional:      General: She is not in acute distress.    Appearance: Normal appearance. She is well-developed. She is not ill-appearing or diaphoretic.  HENT:     Head: Normocephalic and atraumatic.     Right Ear: Hearing and external ear normal.     Left Ear: Hearing and external ear normal.     Nose: No nasal deformity or rhinorrhea.  Eyes:     General: No scleral icterus.    Conjunctiva/sclera: Conjunctivae normal.     Right eye: Right conjunctiva is not injected.     Left eye: Left conjunctiva is not injected.     Pupils: Pupils are equal, round, and  reactive to light.  Neck:     Vascular: No JVD.  Cardiovascular:     Rate and Rhythm: Normal rate and regular  rhythm.     Heart sounds: Normal heart sounds, S1 normal and S2 normal. No murmur heard.  No friction rub. No gallop.   Pulmonary:     Effort: Prolonged expiration present.     Breath sounds: No stridor. No wheezing or rhonchi.  Abdominal:     General: Bowel sounds are normal. There is no distension.     Palpations: Abdomen is soft.     Tenderness: There is no abdominal tenderness.  Musculoskeletal:        General: Normal range of motion.     Right shoulder: Normal.     Left shoulder: Normal.     Cervical back: Normal range of motion and neck supple.     Right hip: Normal.     Left hip: Normal.     Right knee: Normal.     Left knee: Normal.  Lymphadenopathy:     Head:     Right side of head: No submandibular, preauricular or posterior auricular adenopathy.     Left side of head: No submandibular, preauricular or posterior auricular adenopathy.     Cervical: No cervical adenopathy.     Right cervical: No superficial or deep cervical adenopathy.    Left cervical: No superficial or deep cervical adenopathy.  Skin:    General: Skin is warm and dry.     Coloration: Skin is not pale.     Findings: No abrasion, bruising, ecchymosis, erythema, lesion or rash.     Nails: There is no clubbing.  Neurological:     Mental Status: She is alert and oriented to person, place, and time.     Sensory: No sensory deficit.     Coordination: Coordination normal.     Gait: Gait normal.  Psychiatric:        Attention and Perception: Attention and perception normal. She is attentive.        Mood and Affect: Affect normal. Mood is anxious.        Speech: Speech normal.        Behavior: Behavior normal. Behavior is cooperative.        Thought Content: Thought content normal.        Cognition and Memory: Cognition and memory normal.        Judgment: Judgment normal.             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 Mycetomas typically cannot be eradicated with drugs but require surgical resection.  Spiculated lesions in lower lung thought by radiology to be a Aspergillus or other "atypical infection:  I think that the patient would benefit from a bronchoscopy with deep culture sent on BAL for AFB fungal and bacterial cultures.  I will reach out to pulmonary.  She  wanted a different provider than Dr. Melvyn Novas possibly Dr. Valeta Harms who did her last bronchoscopy  We will check beta glucan today and fungal cultures from her sputum.  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  History of coccidiomycosis: She is still very worried about this but I have more anxiety about her Aspergillus

## 2020-02-28 ENCOUNTER — Telehealth: Payer: Self-pay | Admitting: Pulmonary Disease

## 2020-02-28 NOTE — Telephone Encounter (Signed)
Patient calling to inform you Dr. Cristela Blue saw patient 8/18, aspergillus still present, meds not working, she feels she is getting worse, Dr. Cristela Blue mentioned an additional bronch, last one in 2019. Patient wants to make sure you get in touch with Dr. Cristela Blue. FYI

## 2020-02-28 NOTE — Telephone Encounter (Signed)
I have reviewed the patients images. I am happy to set up for bronchoscopy for ID. With the fungal ball she likely has aspergillus in the cavity. We are however limiting elective cases right now due to covid. I will speak with endo about getting her in. I am on night shift the first week of September and this could be pushed out to 2nd week of September. Dr. Tommy Medal is that an ok time frame? Or should I get one of my partners to bronch the first week of Sept? (Next weeks bronch slots are full)   Thanks  [place], DO  Pulmonary Critical Care 02/28/2020 5:46 PM

## 2020-02-29 NOTE — Telephone Encounter (Signed)
I think that is ok timing w me. Thx so much !

## 2020-03-01 NOTE — Telephone Encounter (Signed)
Lauren,   Please see message string below from Dr. Tommy Medal.  Maybe we can get in the office for a pre-bronch visit with one of the APPs next week or the following?  I have bronched her before. Patient of Dr. Melvyn Novas.   Thanks BLI   Garner Nash, DO [place] [facility] 03/01/2020 3:12 PM

## 2020-03-02 NOTE — Progress Notes (Signed)
Patient Care Team: Patient, [name] as PCP - General (General Practice) Melissa Ripple, MD as Referring Physician (Emergency Medicine)  DIAGNOSIS:    ICD-10-CM   1. 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 Malignant neoplasm of upper-inner quadrant of left breast in female, estrogen receptor negative ([place])  C50.212    Z17.1     SUMMARY OF ONCOLOGIC HISTORY: Oncology History  Breast cancer of upper-inner quadrant of left female breast ([place])  06/08/2012 Initial Diagnosis   invasive ductal carcinoma that was ER positive PR positive HER-2/neu positive measuring 3.1 cm by MRI criteria. Ki-67 was 70% HER-2 was amplified with a ratio 2.91   07/12/2012 - 07/17/2013 Neo-Adjuvant Chemotherapy   TCH 6 followed by Herceptin maintenance   12/11/2012 Surgery   Left breast lumpectomy: 1.8 cm tumor 1 positive sentinel node, axillary lymph node dissection 02/08/2013 showed 0/13 lymph nodes   03/25/2013 - 05/06/2013 Radiation Therapy   Adjuvant radiation therapy   06/05/2013 - 07/20/2017 Anti-estrogen oral therapy   Tamoxifen 20 mg daily   07/27/2017 Relapse/Recurrence   MRI Brain: 3.4 x 2.9 x 2.9 cm RIGHT frontal lobe mass with imaging characteristics of solitary metastasis. Extensive vasogenic edema resulting in 9 mm RIGHT to LEFT midline shift. Equivocal very early LEFT ventricle entrapment.    08/04/2017 Surgery   Rt frontal brain resection: Poorly differentiated tumor IHC suggests breast primary ER and PR Positive   08/25/2017 - 09/04/2017 Radiation Therapy   Stereotactic radiation   09/18/2017 -  Anti-estrogen oral therapy   Lapatinib with letrozole   12/18/2017 - 12/19/2017 Radiation Therapy   New right parietal lobe metastases status post [facility]   05/09/2019 Relapse/Recurrence   Interval increase in size of the enhancing nodular left internal mammary soft tissue 2.8 cm.  Redemonstrated enlarged supraclavicular, lower cervical and lower posterior cervical  nodes unchanged.  Interval increase in the bony erosion of the posterior and lateral left third rib, increasing soft tissue lesion eroding the left sternal body 3.1 cm was 2.5 cm.  Bronchiectatic changes   06/19/2019 -  Chemotherapy   The patient had ado-trastuzumab emtansine (KADCYLA) 180 mg in sodium chloride 0.9 % 250 mL chemo infusion, 3.6 mg/kg = 180 mg, Intravenous, Once, 12 of 13 cycles Administration: 180 mg (06/19/2019), 180 mg (07/10/2019), 180 mg (09/17/2019), 180 mg (08/05/2019), 180 mg (08/27/2019), 180 mg (10/08/2019), 180 mg (10/29/2019), 180 mg (11/19/2019), 200 mg (12/10/2019), 200 mg (12/31/2019), 200 mg (01/20/2020), 200 mg (02/11/2020)  for chemotherapy treatment.    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      CHIEF COMPLIANT: Follow-up of metastatic breast cancer  INTERVAL HISTORY: Melissa [name] is a 54 y.o. with above-mentioned history of metastatic breast cancer with brain metastasiss/presection who is currently [place] every 3 weeks.She presents to the clinic todayfortreatment.  ALLERGIES:  is allergic to aspirin, protonix [pantoprazole], and iodinated diagnostic agents.  MEDICATIONS:  Current Outpatient  Medications  Medication Sig Dispense Refill  . albuterol (VENTOLIN HFA) 108 (90 Base) MCG/ACT inhaler Inhale 2 puffs into the lungs every 6 (six) hours as needed. 8 g 5  . budesonide-formoterol (SYMBICORT) 160-4.5  MCG/ACT inhaler Inhale 2 puffs into the lungs 2 (two) times daily. 3 Inhaler 3  . chlorpheniramine-HYDROcodone (TUSSIONEX) 10-8 MG/5ML SUER Take 5 mLs by mouth every 12 (twelve) hours as needed for cough. 240 mL 0  . CRESEMBA 186 MG CAPS TAKE 2 CAPSULES BY MOUTH EVERY 24 HOURS 56 capsule 2  . levalbuterol (XOPENEX) 0.63 MG/3ML nebulizer solution Take 3 mLs (0.63 mg total) by nebulization every 4 (four) hours as needed for wheezing or shortness of breath. 540 mL 6  . lidocaine-prilocaine (EMLA) cream Apply to affected area once 30 g 3  . Melatonin 5 MG TABS Take 5 mg by mouth at bedtime as needed.    . naproxen sodium (ALEVE) 220 MG tablet Take 440 mg by mouth 2 (two) times daily as needed (headache).     . predniSONE (DELTASONE) 50 MG tablet Take 1 tablet 13 hours, 7 hours, and 1 hour prior to contrast 3 tablet 4   No current facility-administered medications for this visit.    PHYSICAL EXAMINATION: ECOG PERFORMANCE STATUS: 1 - Symptomatic but completely ambulatory  Vitals:   03/03/20 0936  BP: 104/63  Pulse: 70  Resp: 18  Temp: (!) 97.3 F (36.3 C)  SpO2: 99%   Filed Weights   03/03/20 0936  Weight: 114 lb (51.7 kg)    LABORATORY DATA:  I have reviewed the data as listed CMP Latest Ref Rng & Units 02/11/2020 01/28/2020 01/20/2020  Glucose 70 - 99 mg/dL 102(H) 89 96  BUN 6 - 20 mg/dL _0 Creatinine 0.44 - 1.00 mg/dL 0.89 0.90 0.89  Sodium 135 - 145 mmol/L 135 137 137  Potassium 3.5 - 5.1 mmol/L 3.7 3.8 4.0  Chloride 98 - 111 mmol/L 102 103 103  CO2 22 - 32 mmol/L _1 Calcium 8.9 - 10.3 mg/dL 9.9 9.5 9.3  Total Protein 6.5 - 8.1 g/dL 8.1 7.8 8.0  Total Bilirubin 0.3 - 1.2 mg/dL <0.2(L) <0.2(L) <0.2(L)  Alkaline Phos 38 - 126 U/L 143(H) 140(H) 127(H)  AST 15 - 41 U/L _2 ALT 0 - 44 U/L _3 Lab Results  Component Value Date   WBC 10.9 (H) 03/03/2020   HGB 11.3 (L) 03/03/2020   HCT 34.5 (L) 03/03/2020   MCV 87.1 03/03/2020   PLT 279 03/03/2020    NEUTROABS 6.0 03/03/2020    ASSESSMENT & PLAN:  Breast cancer of upper-inner quadrant of left female breast ([place]) 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 Anti Her 2 therapy with Lapatinibstarted 09/17/2017-05/14/2019: Stopped for progression 3.I discontinuedtamoxifen and started her on letrozole 2.5 mg daily.05/14/2019 stopped for progression 4.Stereotactic radiosurgery 12/19/2017 to the new right parietal lobe metastases. -------------------------------------------------------------------------------------------------------------------- Liver Biopsy 06/05/19: Metastatic cancer, ER/PR: 0%, Her 2: 3+ Positive, Ki 67: 20% Patient has metastases to liver, bone, brain, and questionably lung  Current treatment: Kadcyla every 3 weeks, today is cycle13 Kadcyla toxicities: 1.Occasional  diarrhea 2.mild peripheral neuropathy 3.Normocytic anemia: Monitoring. Received blood transfusion in July 2021.  Bone metastases: Because of dental issues bisphosphonates were not started CT CAP 02/04/2020: Stable cavitary lesions and bronchiectasis left lung with new mycetoma/fungal ball left upper lung.  New cluster of spiculated nodular opacities posterior left lung aspergillosis.  Stable bone lesion within the sternum.  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  She is expected to undergo bronchoscopy. Hoarseness of voice: I suspect it is related to acid reflux.  We discussed about taking antacids/PPI for a couple of  weeks.  Return to clinic every 3 weeks for Kadcyla every 6 weeks to follow-up     No orders of the defined types were placed in this encounter.  The patient has a good understanding of the overall plan. she agrees with it. she will call with any problems that may develop before the next visit here.  Total time spent: 30 mins including face to face time and time spent for planning, charting and coordination of care  Gardenia Phlegm* 03/03/2020  I, Cloyde Reams Dors[place], am acting as scribe for Dr. Nicholas Lose.  I have reviewed the above documentation for accuracy and completeness, and I agree with the above.

## 2020-03-02 NOTE — Telephone Encounter (Signed)
ATC patient unable to reach LM to call back office (x1)  

## 2020-03-03 ENCOUNTER — Other Ambulatory Visit: Payer: Self-pay

## 2020-03-03 ENCOUNTER — Ambulatory Visit: Payer: PRIVATE HEALTH INSURANCE | Admitting: Nurse Practitioner

## 2020-03-03 ENCOUNTER — Inpatient Hospital Stay (HOSPITAL_BASED_OUTPATIENT_CLINIC_OR_DEPARTMENT_OTHER): Payer: PRIVATE HEALTH INSURANCE | Admitting: Hematology and Oncology

## 2020-03-03 ENCOUNTER — Inpatient Hospital Stay: Payer: PRIVATE HEALTH INSURANCE

## 2020-03-03 VITALS — BP 104/63 | HR 70 | Temp 97.3°F | Resp 18 | Ht 62.0 in | Wt 114.0 lb

## 2020-03-03 DIAGNOSIS — C50212 Malignant neoplasm of upper-inner quadrant of left female breast: Secondary | ICD-10-CM

## 2020-03-03 DIAGNOSIS — C7931 Secondary malignant neoplasm of brain: Secondary | ICD-10-CM

## 2020-03-03 DIAGNOSIS — Z171 Estrogen receptor negative status [ER-]: Secondary | ICD-10-CM | POA: Diagnosis not present

## 2020-03-03 DIAGNOSIS — C50912 Malignant neoplasm of unspecified site of left female breast: Secondary | ICD-10-CM

## 2020-03-03 DIAGNOSIS — Z95828 Presence of other vascular implants and grafts: Secondary | ICD-10-CM

## 2020-03-03 DIAGNOSIS — Z5112 Encounter for antineoplastic immunotherapy: Secondary | ICD-10-CM | POA: Diagnosis not present

## 2020-03-03 LAB — CBC WITH DIFFERENTIAL/PLATELET
Abs Immature Granulocytes: 0.03 10*3/uL (ref 0.00–0.07)
Basophils Absolute: 0.1 10*3/uL (ref 0.0–0.1)
Basophils Relative: 1 %
Eosinophils Absolute: 2 10*3/uL — ABNORMAL HIGH (ref 0.0–0.5)
Eosinophils Relative: 18 %
HCT: 34.5 % — ABNORMAL LOW (ref 36.0–46.0)
Hemoglobin: 11.3 g/dL — ABNORMAL LOW (ref 12.0–15.0)
Immature Granulocytes: 0 %
Lymphocytes Relative: 18 %
Lymphs Abs: 2 10*3/uL (ref 0.7–4.0)
MCH: 28.5 pg (ref 26.0–34.0)
MCHC: 32.8 g/dL (ref 30.0–36.0)
MCV: 87.1 fL (ref 80.0–100.0)
Monocytes Absolute: 0.9 10*3/uL (ref 0.1–1.0)
Monocytes Relative: 8 %
Neutro Abs: 6 10*3/uL (ref 1.7–7.7)
Neutrophils Relative %: 55 %
Platelets: 279 10*3/uL (ref 150–400)
RBC: 3.96 MIL/uL (ref 3.87–5.11)
RDW: 20.9 % — ABNORMAL HIGH (ref 11.5–15.5)
WBC: 10.9 10*3/uL — ABNORMAL HIGH (ref 4.0–10.5)
nRBC: 0 % (ref 0.0–0.2)

## 2020-03-03 LAB — COMPREHENSIVE METABOLIC PANEL
ALT: 10 U/L (ref 0–44)
AST: 18 U/L (ref 15–41)
Albumin: 2.9 g/dL — ABNORMAL LOW (ref 3.5–5.0)
Alkaline Phosphatase: 142 U/L — ABNORMAL HIGH (ref 38–126)
Anion gap: 8 (ref 5–15)
BUN: 9 mg/dL (ref 6–20)
CO2: 27 mmol/L (ref 22–32)
Calcium: 10.4 mg/dL — ABNORMAL HIGH (ref 8.9–10.3)
Chloride: 102 mmol/L (ref 98–111)
Creatinine, Ser: 0.86 mg/dL (ref 0.44–1.00)
GFR calc Af Amer: >60 mL/min (ref >60)
GFR calc non Af Amer: >60 mL/min (ref >60)
Glucose, Bld: 77 mg/dL (ref 70–99)
Potassium: 3.9 mmol/L (ref 3.5–5.1)
Sodium: 137 mmol/L (ref 135–145)
Total Bilirubin: <0.2 mg/dL — ABNORMAL LOW (ref 0.3–1.2)
Total Protein: 8.1 g/dL (ref 6.5–8.1)

## 2020-03-03 MED ORDER — ACETAMINOPHEN 325 MG PO TABS
650.0000 mg | ORAL_TABLET | Freq: Once | ORAL | Status: AC
  Administered 2020-03-03: 650 mg via ORAL

## 2020-03-03 MED ORDER — SODIUM CHLORIDE 0.9 % IV SOLN
Freq: Once | INTRAVENOUS | Status: AC
  Filled 2020-03-03: qty 250

## 2020-03-03 MED ORDER — HEPARIN SOD (PORK) LOCK FLUSH 100 UNIT/ML IV SOLN
500.0000 [IU] | Freq: Once | INTRAVENOUS | Status: AC | PRN
  Administered 2020-03-03: 500 [IU]
  Filled 2020-03-03: qty 5

## 2020-03-03 MED ORDER — SODIUM CHLORIDE 0.9% FLUSH
10.0000 mL | INTRAVENOUS | Status: DC | PRN
  Administered 2020-03-03: 10 mL
  Filled 2020-03-03: qty 10

## 2020-03-03 MED ORDER — SODIUM CHLORIDE 0.9 % IV SOLN
200.0000 mg | Freq: Once | INTRAVENOUS | Status: AC
  Administered 2020-03-03: 200 mg via INTRAVENOUS
  Filled 2020-03-03: qty 10

## 2020-03-03 MED ORDER — DIPHENHYDRAMINE HCL 25 MG PO CAPS
ORAL_CAPSULE | ORAL | Status: AC
  Filled 2020-03-03: qty 2

## 2020-03-03 MED ORDER — DIPHENHYDRAMINE HCL 25 MG PO CAPS
25.0000 mg | ORAL_CAPSULE | Freq: Once | ORAL | Status: AC
  Administered 2020-03-03: 25 mg via ORAL

## 2020-03-03 MED ORDER — ACETAMINOPHEN 325 MG PO TABS
ORAL_TABLET | ORAL | Status: AC
  Filled 2020-03-03: qty 2

## 2020-03-03 NOTE — Assessment & Plan Note (Addendum)
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 Anti Her 2 therapy with Lapatinibstarted 09/17/2017-05/14/2019: Stopped for progression 3.I discontinuedtamoxifen and started her on letrozole 2.5 mg daily.05/14/2019 stopped for progression 4.Stereotactic radiosurgery 12/19/2017 to the new right parietal lobe metastases. -------------------------------------------------------------------------------------------------------------------- Liver Biopsy 06/05/19: Metastatic cancer, ER/PR: 0%, Her 2: 3+ Positive, Ki 67: 20% Patient has metastases to liver, bone, brain, and questionably lung  Current treatment: Kadcyla every 3 weeks, today is cycle13 Kadcyla toxicities: 1.Occasional diarrhea 2.mild peripheral neuropathy 3.Normocytic anemia: Monitoring. She does feel tired from the anemia but we decided to hold off on blood transfusion at this time.  Bone metastases: Because of dental issues bisphosphonates were not started CT CAP 02/04/2020: Stable cavitary lesions and bronchiectasis left lung with new mycetoma/fungal ball left upper lung.  New cluster of spiculated nodular opacities posterior left lung aspergillosis.  Stable bone lesion within the sternum.  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  Return to clinic every 3 weeks for Kadcyla every 6 weeks to follow-up

## 2020-03-03 NOTE — Telephone Encounter (Signed)
ATC patient unable to reach LM to call back office (x2)  Also sent Mychart message

## 2020-03-03 NOTE — Patient Instructions (Signed)
[facility] Discharge Instructions for Patients Receiving Chemotherapy  Today you received the following chemotherapy agents Ado-trastuzumab emtansine (KADCYLA).  To help prevent nausea and vomiting after your treatment, we encourage you to take your nausea medication as prescribed.   If you develop nausea and vomiting that is not controlled by your nausea medication, call the clinic.   BELOW ARE SYMPTOMS THAT SHOULD BE REPORTED IMMEDIATELY:  *FEVER GREATER THAN 100.5 F  *CHILLS WITH OR WITHOUT FEVER  NAUSEA AND VOMITING THAT IS NOT CONTROLLED WITH YOUR NAUSEA MEDICATION  *UNUSUAL SHORTNESS OF BREATH  *UNUSUAL BRUISING OR BLEEDING  TENDERNESS IN MOUTH AND THROAT WITH OR WITHOUT PRESENCE OF ULCERS  *URINARY PROBLEMS  *BOWEL PROBLEMS  UNUSUAL RASH Items with * indicate a potential emergency and should be followed up as soon as possible.  Feel free to call the clinic should you have any questions or concerns. The clinic phone number is [phone].  Please show the CHEMO ALERT CARD at check-in to the Emergency Department and triage nurse.   

## 2020-03-04 ENCOUNTER — Telehealth: Payer: Self-pay | Admitting: Hematology and Oncology

## 2020-03-04 NOTE — Telephone Encounter (Signed)
Scheduled appts per 8/24 los. Pt to get updated appt calendar at next visit per appt notes.

## 2020-03-05 ENCOUNTER — Ambulatory Visit (INDEPENDENT_AMBULATORY_CARE_PROVIDER_SITE_OTHER): Payer: PRIVATE HEALTH INSURANCE | Admitting: Pulmonary Disease

## 2020-03-05 ENCOUNTER — Encounter: Payer: Self-pay | Admitting: Pulmonary Disease

## 2020-03-05 ENCOUNTER — Other Ambulatory Visit: Payer: Self-pay

## 2020-03-05 VITALS — BP 104/68 | HR 96 | Ht 62.0 in | Wt 113.6 lb

## 2020-03-05 DIAGNOSIS — Z0000 Encounter for general adult medical examination without abnormal findings: Secondary | ICD-10-CM | POA: Insufficient documentation

## 2020-03-05 DIAGNOSIS — B479 Mycetoma, unspecified: Secondary | ICD-10-CM

## 2020-03-05 DIAGNOSIS — K219 Gastro-esophageal reflux disease without esophagitis: Secondary | ICD-10-CM | POA: Insufficient documentation

## 2020-03-05 DIAGNOSIS — F17210 Nicotine dependence, cigarettes, uncomplicated: Secondary | ICD-10-CM

## 2020-03-05 DIAGNOSIS — C50912 Malignant neoplasm of unspecified site of left female breast: Secondary | ICD-10-CM

## 2020-03-05 DIAGNOSIS — B449 Aspergillosis, unspecified: Secondary | ICD-10-CM | POA: Diagnosis not present

## 2020-03-05 DIAGNOSIS — Z8619 Personal history of other infectious and parasitic diseases: Secondary | ICD-10-CM | POA: Diagnosis not present

## 2020-03-05 DIAGNOSIS — J479 Bronchiectasis, uncomplicated: Secondary | ICD-10-CM

## 2020-03-05 DIAGNOSIS — J438 Other emphysema: Secondary | ICD-10-CM

## 2020-03-05 DIAGNOSIS — C7931 Secondary malignant neoplasm of brain: Secondary | ICD-10-CM

## 2020-03-05 MED ORDER — OMEPRAZOLE 40 MG PO CPDR: 40.0000 mg | DELAYED_RELEASE_CAPSULE | Freq: Every day | ORAL | 3 refills | Status: DC

## 2020-03-05 NOTE — Assessment & Plan Note (Signed)
Plan: Continue follow-up with oncology 

## 2020-03-05 NOTE — Assessment & Plan Note (Signed)
Suspect atypical chest pain the patient has been developing is due to acid reflux  Plan: Start omeprazole 40 mg daily Discussed lifestyle measures Work on reducing smoking

## 2020-03-05 NOTE — Assessment & Plan Note (Signed)
Plan: Recommend stopping smoking Recommend COVID-19 vaccines Recommend seasonal flu vaccine

## 2020-03-05 NOTE — Assessment & Plan Note (Signed)
Plan: We will coordinate outpatient bronchoscopy with Dr. Valeta Harms Continue follow-up with infectious disease Continue flutter valve Continue Mucinex Work on reducing smoking

## 2020-03-05 NOTE — Assessment & Plan Note (Signed)
Plan: Continue follow-up with infectious disease We will establish care with Dr. Valeta Harms

## 2020-03-05 NOTE — Progress Notes (Signed)
_0  ID: Melissa [name], female    DOB: March 11, 1966, 54 y.o.   MRN: [id number]  Chief Complaint  Patient presents with  . 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 Pt does have increased SOB and states she also is coughing with occ yellow to occ blood in the phlegm.    Referring provider: No ref. provider found  HPI:  54 year old female current smoker upon our office for emphysema, history of obstructive bronchiectasis, mycetoma.  Patient status post left upper lobectomy in 1997.  PMH: Migraines, aortic atherosclerosis, Smoker/ Smoking History: Current smoker 10 cigarettes a day.  24-pack-year smoking history. Maintenance: Symbicort 160 Pt of: Dr. Melvyn Novas  03/05/2020  - Visit   54 year old female active smoker followed in our office for emphysema, history of obstructive bronchiectasis and fungal infection.  Status post left upper lobectomy in 97.  Patient of Dr. Melvyn Novas.  Patient scheduled for follow-up today after infectious disease requested evaluation as well as potential bronchoscopy.  Follow-up was coordinated by Dr. Valeta Harms. Patient last seen by infectious disease on 02/26/2020 for a mycetoma.  Dr. Drucilla Schmidt.  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 Mycetomas typically cannot be eradicated with drugs but require surgical resection.  Spiculated lesions in lower lung thought by radiology to be a Aspergillus or other "atypical infection:  I think that the patient would benefit from a bronchoscopy with deep culture sent on BAL for AFB fungal and bacterial cultures.  I will reach out to pulmonary.  She  wanted a different provider than Dr. Melvyn Novas possibly Dr. Valeta Harms who did her last bronchoscopy  We will check beta glucan  today and fungal cultures from her sputum.  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  History of coccidiomycosis: She is still very worried about this but I have more anxiety about her Aspergillus  Unfortunately patient is also currently receiving chemotherapy treatments from oncology Dr. Lindi Adie.  Patient was last seen by oncology on 03/03/2020.  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 Anti Her 2 therapy with Lapatinibstarted 09/17/2017-05/14/2019: Stopped for progression 3.I discontinuedtamoxifen and started her on letrozole 2.5 mg daily.05/14/2019 stopped for progression 4.Stereotactic radiosurgery 12/19/2017 to the new right parietal lobe metastases. -------------------------------------------------------------------------------------------------------------------- Liver Biopsy 06/05/19: Metastatic cancer, ER/PR: 0%, Her 2: 3+ Positive, Ki 67: 20% Patient has metastases to liver, bone, brain, and questionably lung  Current treatment: Kadcyla every 3 weeks, today is cycle13 Kadcyla toxicities: 1.Occasional diarrhea 2.mild peripheral neuropathy 3.Normocytic anemia: Monitoring. Received blood transfusion in July 2021.  Bone metastases: Because of dental issues bisphosphonates were not started CT CAP 02/04/2020: Stable cavitary lesions and bronchiectasis left lung with new  mycetoma/fungal ball left upper lung. New cluster of spiculated nodular opacities posterior left  lung aspergillosis. Stable bone lesion within the sternum.  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  She is expected to undergo bronchoscopy. Hoarseness of voice: I suspect it is related to acid reflux.  We discussed about taking antacids/PPI for a couple of weeks.  Return to clinic every 3 weeks for Kadcyla every 6 weeks to follow-up   Patient presenting to office today reporting ongoing cough.  She remains adherent to using her flutter valve.  She continues to to take Mucinex.  She is working on stopping smoking.  She had a couple of episodes of atypical chest pain.  She is unsure what may be causing this.  Could be acid reflux.  Patient reports that Rolaids did help.  She has had 3 episodes over the last 6-8 weeks.  She is not on a PPI.  Patient reports that when she was on higher dose of steroid she was on Nexium and this was helpful.  She continues to cough occasionally yellow to yellow mucus.  With large coughing fit she does have blood streaks in her sputum.  This is her baseline.  She would like to establish with Dr. Valeta Harms in the pulmonary setting moving forward.  Questionaires / Pulmonary Flowsheets:   ACT:  No flowsheet data found.  MMRC: mMRC Dyspnea Scale mMRC Score  03/05/2020 1    Epworth:  No flowsheet data found.  Tests:   02/04/2020-CT chest with contrast-stable cavitary lesions and associated bronchiectasis in left upper and midlung with new mycetoma/fungus ball and largest area of cavitation left upper lung, new cluster of several spiculated nodular opacities in posterior left lung which favor as per Kalosis or other atypical infectious etiology over neoplasm,  08/09/2016-pulmonary function test-FVC 2.79 (83% predicted), postbronchodilator ratio 70, postbronchodilator FEV1 2.05 (77% predicted), no bronchodilator response, TLC 4.75 (100%  predicted), DLCO 10.08 (46% predicted)  FENO:  No results found for: NITRICOXIDE  PFT: PFT Results Latest Ref Rng & Units 08/09/2016  FVC-Pre L 2.79  FVC-Predicted Pre % 83  FVC-Post L 2.92  FVC-Predicted Post % 87  Pre FEV1/FVC % % 69  Post FEV1/FCV % % 70  FEV1-Pre L 1.94  FEV1-Predicted Pre % 73  FEV1-Post L 2.05  DLCO uncorrected ml/min/mmHg 10.08  DLCO UNC% % 46  DLVA Predicted % 55  TLC L 4.75  TLC % Predicted % 100  RV % Predicted % 124    WALK:  SIX MIN WALK 05/02/2018  Supplimental Oxygen during Test? (L/min) No  Tech Comments: Fast paced with some SOB noted.    Imaging: No results found.  Lab Results:  CBC    Component Value Date/Time   WBC 10.9 (H) 03/03/2020 0930   RBC 3.96 03/03/2020 0930   HGB 11.3 (L) 03/03/2020 0930   HGB 11.8 (L) 02/11/2020 0836   HGB 13.1 08/12/2014 0955   HCT 34.5 (L) 03/03/2020 0930   HCT 40.4 08/12/2014 0955   PLT 279 03/03/2020 0930   PLT 303 02/11/2020 0836   PLT 228 08/12/2014 0955   MCV 87.1 03/03/2020 0930   MCV 98.2 08/12/2014 0955   MCH 28.5 03/03/2020 0930   MCHC 32.8 03/03/2020 0930   RDW 20.9 (H) 03/03/2020 0930   RDW 13.0 08/12/2014 0955   LYMPHSABS 2.0 03/03/2020 0930   LYMPHSABS 2.0 08/12/2014 0955   MONOABS 0.9 03/03/2020 0930   MONOABS 0.5 08/12/2014 0955   EOSABS 2.0 (H) 03/03/2020 0930   EOSABS 0.2 08/12/2014 0955   BASOSABS 0.1 03/03/2020 0930  BASOSABS 0.0 08/12/2014 0955    BMET    Component Value Date/Time   NA 137 03/03/2020 0930   NA 142 08/12/2014 0956   K 3.9 03/03/2020 0930   K 4.6 08/12/2014 0956   CL 102 03/03/2020 0930   CL 100 12/20/2012 1509   CO2 27 03/03/2020 0930   CO2 26 08/12/2014 0956   GLUCOSE 77 03/03/2020 0930   GLUCOSE 94 08/12/2014 0956   GLUCOSE 98 12/20/2012 1509   BUN 9 03/03/2020 0930   BUN 10.1 08/12/2014 0956   CREATININE 0.86 03/03/2020 0930   CREATININE 0.89 02/11/2020 0836   CREATININE 0.88 03/11/2019 1057   CREATININE 0.9 08/12/2014 0956    CALCIUM 10.4 (H) 03/03/2020 0930   CALCIUM 9.3 08/12/2014 0956   GFRNONAA >60 03/03/2020 0930   GFRNONAA >60 02/11/2020 0836   GFRNONAA 76 03/11/2019 1057   GFRAA >60 03/03/2020 0930   GFRAA >60 02/11/2020 0836   GFRAA 88 03/11/2019 1057    BNP No results found for: BNP  ProBNP No results found for: PROBNP  Specialty Problems      Pulmonary Problems   Obstructive bronchiectasis (HCC)    Status post left upper lobectomy for ? [place] CT chest   01/26/2011 Focal bronchiectasis / scarring medially in the left lung apex with  surrounding inflammatory changes  - Pneumovax 08/12/11  And prevnar 08/09/2016  - PFT's  08/09/2016  FEV1 2.05 (77 % ) ratio 70  p 5 % improvement from saba p dulera 100 x2  prior to study with DLCO  46 % corrects to 55  % for alv volume   - alpha one screening 03/27/2018  MM 267  - FOB/BAL 05/23/18 Pos Aspergillus > ID following -  12/13/2018  After extensive coaching inhaler device,  effectiveness =    75% (short ti)        Upper airway cough syndrome    Max rx for gerd trial basis 08/09/2016 >>>         HCAP (healthcare-associated pneumonia)    New density November 05 2016 where prev there was a cystic lesion > rx levaquin with marked improvement cxr 11/25/2016  > f/u cxr 06/16/2017 no def pna        CAP (community acquired pneumonia)    See cxr 11/22/2017 LLL > rx augmentin x 10 days       Emphysema lung ([place])   Cavitary lesion of lung in area of previous cyts in apex of Sup Segment of LLL    New since CT 01/18/18  assoc with acute onset L axillary cp 03/28/18 in area of previous blebs s a/f level c/w infected bleb rx augmentin x 10 days > improved at ov 04/11/2018 and 10 more days augmentin given - Quant TB 03/27/2018 neg  - ID eval 04/09/18 fungal serologies sent > neg  - 04/11/2018 rec another 10 days augmentin for ? Abscess forming LUL  - Following with ID ? Start fluconazole if not improving. Req records be sent to MD who treated [place] fever in '97    - CT 04/26/18 > Overall, there has been interval worsening in lung disease on the Left  - FOB Dr Windell [place] 05/24/19 with BAL pos for aspergillus > see ID rx          DOE (dyspnea on exertion)    05/02/2018  Walked RA x 3 laps @ 185 ft each stopped due to  End of study, fast pace, no   desat -  min sob            Allergies  Allergen Reactions  . Aspirin Anaphylaxis and Shortness Of Breath    THROAT CLOSES  . Protonix [Pantoprazole] Nausea Only and Other (See Comments)    Also caused a "film in the mouth" and caused chest pressure  . Iodinated Diagnostic Agents Rash    Immunization History  Administered Date(s) Administered  . Influenza Split 05/03/2011  . Influenza Whole 03/11/2012  . Influenza,inj,Quad PF,6+ Mos 07/29/2017, 04/08/2019  . Pneumococcal Conjugate-13 08/09/2016  . Pneumococcal-Unspecified 08/12/2011    Past Medical History:  Diagnosis Date  . Anemia   . Arthritis    knees and hips  . 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[place]) 02/26/2020  . Asthma   . Breast cancer ([place])   . Bronchiectasis ([place])   . Bronchiolitis   . Cancer [facility])    breast cancer 2014  . Cancer, metastatic to liver ([place])    2021  . Complication of anesthesia    bp dropped + desat   . COPD (chronic obstructive pulmonary disease) ([place])   . Dyspnea    DOE  . GERD (gastroesophageal reflux disease)   . H/O coccidioidomycosis    was reason for lung lobectomy  . Headache(784.0)    due to eye strain or not eating  . History of anemia    no current problem  . History of asthma    as a child  . History of breast cancer 2014   left  . History of chemotherapy    finished 07/17/2013  . History of hiatal hernia    AGE 68  . Hx of radiation therapy 03/25/13-05/06/13   left breast 5000 cGy/25 sessions, left breast boost 1000 cGy/5 sessions  . 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 Pneumonia    LAST FLARE UP 01/2018  . Runny nose 07/30/2013   clear drainage  . Wears dentures    upper    Tobacco History: Social History    Tobacco Use  Smoking Status Current Every Day Smoker  . Packs/day: 1.00  . Years: 24.00  . Pack years: 24.00  . Types: Cigarettes  Smokeless Tobacco Never Used  Tobacco Comment   .5ppd as of 03/05/20   Ready to quit: Not Answered Counseling given: Not Answered Comment: .5ppd as of 03/05/20   Continue to not smoke  Outpatient Encounter Medications as of 03/05/2020  Medication Sig  . albuterol (VENTOLIN HFA) 108 (90 Base) MCG/ACT inhaler Inhale 2 puffs into the lungs every 6 (six) hours as needed.  . budesonide-formoterol (SYMBICORT) 160-4.5 MCG/ACT inhaler Inhale 2 puffs into the lungs 2 (two) times daily.  . chlorpheniramine-HYDROcodone (TUSSIONEX) 10-8 MG/5ML SUER Take 5 mLs by mouth every 12 (twelve) hours as needed for cough.  . CRESEMBA 186 MG CAPS TAKE 2 CAPSULES BY MOUTH EVERY 24 HOURS  . levalbuterol (XOPENEX) 0.63 MG/3ML nebulizer solution Take 3 mLs (0.63 mg total) by nebulization every 4 (four) hours as needed for wheezing or shortness of breath.  . lidocaine-prilocaine (EMLA) cream Apply to affected area once  . Melatonin 5 MG TABS Take 5 mg by mouth at bedtime as needed.  . naproxen sodium (ALEVE) 220 MG tablet Take 440 mg by mouth 2 (two) times daily as needed (headache).   Marland Kitchen omeprazole (PRILOSEC) 40 MG capsule Take 1 capsule (40 mg total) by mouth daily.  . predniSONE (DELTASONE) 50 MG tablet Take 1 tablet 13 hours, 7 hours, and 1 hour prior to contrast (Patient not taking: Reported on 03/05/2020)  No facility-administered encounter medications on file as of 03/05/2020.     Review of Systems  Review of Systems  Constitutional: Positive for fatigue. Negative for activity change and fever.  HENT: Positive for congestion. Negative for sinus pressure, sinus pain and sore throat.   Respiratory: Positive for cough. Negative for shortness of breath and wheezing.   Cardiovascular: Negative for chest pain and palpitations.  Gastrointestinal: Negative for diarrhea, nausea  and vomiting.       ? gerd  Musculoskeletal: Negative for arthralgias.  Neurological: Negative for dizziness.  Psychiatric/Behavioral: Negative for sleep disturbance. The patient is not nervous/anxious.      Physical Exam  BP 104/68 (BP Location: Right Arm, Cuff Size: Normal)   Pulse 96   Ht 5' 2" (1.575 m)   Wt 113 lb 9.6 oz (51.5 kg)   LMP 07/25/2012 Comment: pregnancy waiver form signed 01-18-2018  SpO2 97%   BMI 20.78 kg/m   Wt Readings from Last 5 Encounters:  03/05/20 113 lb 9.6 oz (51.5 kg)  03/03/20 114 lb (51.7 kg)  02/26/20 116 lb (52.6 kg)  02/11/20 114 lb 12.8 oz (52.1 kg)  01/30/20 116 lb 14.4 oz (53 kg)    BMI Readings from Last 5 Encounters:  03/05/20 20.78 kg/m  03/03/20 20.85 kg/m  02/26/20 21.22 kg/m  02/11/20 21.00 kg/m  01/30/20 21.38 kg/m     Physical Exam Vitals and nursing note reviewed.  Constitutional:      General: She is not in acute distress.    Appearance: Normal appearance. She is normal weight.  HENT:     Head: Normocephalic and atraumatic.     Right Ear: Tympanic membrane, ear canal and external ear normal. There is no impacted cerumen.     Left Ear: Tympanic membrane, ear canal and external ear normal. There is no impacted cerumen.     Nose: Nose normal. No congestion.     Mouth/Throat:     Mouth: Mucous membranes are moist.     Pharynx: Oropharynx is clear.  Eyes:     Pupils: Pupils are equal, round, and reactive to light.  Cardiovascular:     Rate and Rhythm: Normal rate and regular rhythm.     Pulses: Normal pulses.     Heart sounds: Normal heart sounds. No murmur heard.   Pulmonary:     Effort: Pulmonary effort is normal. No respiratory distress.     Breath sounds: No decreased air movement. Examination of the left-upper field reveals decreased breath sounds. Examination of the left-lower field reveals wheezing. Decreased breath sounds and wheezing present. No rales.  Abdominal:     General: Abdomen is flat. Bowel  sounds are normal.     Palpations: Abdomen is soft.  Musculoskeletal:     Cervical back: Normal range of motion.  Skin:    General: Skin is warm and dry.     Capillary Refill: Capillary refill takes less than 2 seconds.  Neurological:     General: No focal deficit present.     Mental Status: She is alert and oriented to person, place, and time. Mental status is at baseline.     Gait: Gait normal.  Psychiatric:        Mood and Affect: Mood normal.        Behavior: Behavior normal.        Thought Content: Thought content normal.        Judgment: Judgment normal.       Assessment & Plan:   Obstructive  bronchiectasis ([place]) Plan: We will coordinate outpatient bronchoscopy with Dr. Valeta Harms Continue follow-up with infectious disease Continue flutter valve Continue Mucinex Work on reducing smoking   Emphysema lung ([place]) Plan: Continue Symbicort 160 We will establish care with Dr. 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 Valeta Harms as previously planned We will establish care with Dr. Valeta Harms in the outpatient setting  Healthcare maintenance Plan: Recommend stopping smoking Recommend COVID-19 vaccines Recommend seasonal flu vaccine  H/O coccidioidomycosis Plan: Continue follow-up with infectious disease We will establish care with Dr. Valeta Harms  Cigarette smoker Plan: Continue to work on reducing smoking Emphasized need to stop smoking   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[place]) Plan: Continue follow-up with infectious disease    Return in about 2 months (around 05/05/2020), or if symptoms  worsen or fail to improve, for Follow up with Dr. Valeta Harms, [place].   [name], NP 03/05/2020   This appointment required 42 minutes of patient care (this includes precharting, chart review, review of results, face-to-face care, etc.).

## 2020-03-05 NOTE — Assessment & Plan Note (Signed)
Plan: Continue follow-up with infectious disease We will coordinate outpatient bronchoscopy with Dr. Valeta Harms as previously planned We will establish care with Dr. Valeta Harms in the outpatient setting

## 2020-03-05 NOTE — Assessment & Plan Note (Signed)
Plan: Continue follow-up with infectious disease 

## 2020-03-05 NOTE — Assessment & Plan Note (Signed)
Plan: Continue to work on reducing smoking Emphasized need to stop smoking

## 2020-03-05 NOTE — H&P (View-Only) (Signed)
_0  ID: Melissa [name], female    DOB: 06/03/1966, 54 y.o.   MRN: [id number]  Chief Complaint  Patient presents with  . 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 Pt does have increased SOB and states she also is coughing with occ yellow to occ blood in the phlegm.    Referring provider: No ref. provider found  HPI:  54 year old female current smoker upon our office for emphysema, history of obstructive bronchiectasis, mycetoma.  Patient status post left upper lobectomy in 1997.  PMH: Migraines, aortic atherosclerosis, Smoker/ Smoking History: Current smoker 10 cigarettes a day.  24-pack-year smoking history. Maintenance: Symbicort 160 Pt of: Dr. Melvyn Novas  03/05/2020  - Visit   54 year old female active smoker followed in our office for emphysema, history of obstructive bronchiectasis and fungal infection.  Status post left upper lobectomy in 97.  Patient of Dr. Melvyn Novas.  Patient scheduled for follow-up today after infectious disease requested evaluation as well as potential bronchoscopy.  Follow-up was coordinated by Dr. Valeta Harms. Patient last seen by infectious disease on 02/26/2020 for a mycetoma.  Dr. Drucilla Schmidt.  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 Mycetomas typically cannot be eradicated with drugs but require surgical resection.  Spiculated lesions in lower lung thought by radiology to be a Aspergillus or other "atypical infection:  I think that the patient would benefit from a bronchoscopy with deep culture sent on BAL for AFB fungal and bacterial cultures.  I will reach out to pulmonary.  She  wanted a different provider than Dr. Melvyn Novas possibly Dr. Valeta Harms who did her last bronchoscopy  We will check beta glucan  today and fungal cultures from her sputum.  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  History of coccidiomycosis: She is still very worried about this but I have more anxiety about her Aspergillus  Unfortunately patient is also currently receiving chemotherapy treatments from oncology Dr. Lindi Adie.  Patient was last seen by oncology on 03/03/2020.  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 Anti Her 2 therapy with Lapatinibstarted 09/17/2017-05/14/2019: Stopped for progression 3.I discontinuedtamoxifen and started her on letrozole 2.5 mg daily.05/14/2019 stopped for progression 4.Stereotactic radiosurgery 12/19/2017 to the new right parietal lobe metastases. -------------------------------------------------------------------------------------------------------------------- Liver Biopsy 06/05/19: Metastatic cancer, ER/PR: 0%, Her 2: 3+ Positive, Ki 67: 20% Patient has metastases to liver, bone, brain, and questionably lung  Current treatment: Kadcyla every 3 weeks, today is cycle13 Kadcyla toxicities: 1.Occasional diarrhea 2.mild peripheral neuropathy 3.Normocytic anemia: Monitoring. Received blood transfusion in July 2021.  Bone metastases: Because of dental issues bisphosphonates were not started CT CAP 02/04/2020: Stable cavitary lesions and bronchiectasis left lung with new  mycetoma/fungal ball left upper lung. New cluster of spiculated nodular opacities posterior left  lung aspergillosis. Stable bone lesion within the sternum.  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  She is expected to undergo bronchoscopy. Hoarseness of voice: I suspect it is related to acid reflux.  We discussed about taking antacids/PPI for a couple of weeks.  Return to clinic every 3 weeks for Kadcyla every 6 weeks to follow-up   Patient presenting to office today reporting ongoing cough.  She remains adherent to using her flutter valve.  She continues to to take Mucinex.  She is working on stopping smoking.  She had a couple of episodes of atypical chest pain.  She is unsure what may be causing this.  Could be acid reflux.  Patient reports that Rolaids did help.  She has had 3 episodes over the last 6-8 weeks.  She is not on a PPI.  Patient reports that when she was on higher dose of steroid she was on Nexium and this was helpful.  She continues to cough occasionally yellow to yellow mucus.  With large coughing fit she does have blood streaks in her sputum.  This is her baseline.  She would like to establish with Dr. Valeta Harms in the pulmonary setting moving forward.  Questionaires / Pulmonary Flowsheets:   ACT:  No flowsheet data found.  MMRC: mMRC Dyspnea Scale mMRC Score  03/05/2020 1    Epworth:  No flowsheet data found.  Tests:   02/04/2020-CT chest with contrast-stable cavitary lesions and associated bronchiectasis in left upper and midlung with new mycetoma/fungus ball and largest area of cavitation left upper lung, new cluster of several spiculated nodular opacities in posterior left lung which favor as per Kalosis or other atypical infectious etiology over neoplasm,  08/09/2016-pulmonary function test-FVC 2.79 (83% predicted), postbronchodilator ratio 70, postbronchodilator FEV1 2.05 (77% predicted), no bronchodilator response, TLC 4.75 (100%  predicted), DLCO 10.08 (46% predicted)  FENO:  No results found for: NITRICOXIDE  PFT: PFT Results Latest Ref Rng & Units 08/09/2016  FVC-Pre L 2.79  FVC-Predicted Pre % 83  FVC-Post L 2.92  FVC-Predicted Post % 87  Pre FEV1/FVC % % 69  Post FEV1/FCV % % 70  FEV1-Pre L 1.94  FEV1-Predicted Pre % 73  FEV1-Post L 2.05  DLCO uncorrected ml/min/mmHg 10.08  DLCO UNC% % 46  DLVA Predicted % 55  TLC L 4.75  TLC % Predicted % 100  RV % Predicted % 124    WALK:  SIX MIN WALK 05/02/2018  Supplimental Oxygen during Test? (L/min) No  Tech Comments: Fast paced with some SOB noted.    Imaging: No results found.  Lab Results:  CBC    Component Value Date/Time   WBC 10.9 (H) 03/03/2020 0930   RBC 3.96 03/03/2020 0930   HGB 11.3 (L) 03/03/2020 0930   HGB 11.8 (L) 02/11/2020 0836   HGB 13.1 08/12/2014 0955   HCT 34.5 (L) 03/03/2020 0930   HCT 40.4 08/12/2014 0955   PLT 279 03/03/2020 0930   PLT 303 02/11/2020 0836   PLT 228 08/12/2014 0955   MCV 87.1 03/03/2020 0930   MCV 98.2 08/12/2014 0955   MCH 28.5 03/03/2020 0930   MCHC 32.8 03/03/2020 0930   RDW 20.9 (H) 03/03/2020 0930   RDW 13.0 08/12/2014 0955   LYMPHSABS 2.0 03/03/2020 0930   LYMPHSABS 2.0 08/12/2014 0955   MONOABS 0.9 03/03/2020 0930   MONOABS 0.5 08/12/2014 0955   EOSABS 2.0 (H) 03/03/2020 0930   EOSABS 0.2 08/12/2014 0955   BASOSABS 0.1 03/03/2020 0930  BASOSABS 0.0 08/12/2014 0955    BMET    Component Value Date/Time   NA 137 03/03/2020 0930   NA 142 08/12/2014 0956   K 3.9 03/03/2020 0930   K 4.6 08/12/2014 0956   CL 102 03/03/2020 0930   CL 100 12/20/2012 1509   CO2 27 03/03/2020 0930   CO2 26 08/12/2014 0956   GLUCOSE 77 03/03/2020 0930   GLUCOSE 94 08/12/2014 0956   GLUCOSE 98 12/20/2012 1509   BUN 9 03/03/2020 0930   BUN 10.1 08/12/2014 0956   CREATININE 0.86 03/03/2020 0930   CREATININE 0.89 02/11/2020 0836   CREATININE 0.88 03/11/2019 1057   CREATININE 0.9 08/12/2014 0956    CALCIUM 10.4 (H) 03/03/2020 0930   CALCIUM 9.3 08/12/2014 0956   GFRNONAA >60 03/03/2020 0930   GFRNONAA >60 02/11/2020 0836   GFRNONAA 76 03/11/2019 1057   GFRAA >60 03/03/2020 0930   GFRAA >60 02/11/2020 0836   GFRAA 88 03/11/2019 1057    BNP No results found for: BNP  ProBNP No results found for: PROBNP  Specialty Problems      Pulmonary Problems   Obstructive bronchiectasis (HCC)    Status post left upper lobectomy for ? [place] CT chest   01/26/2011 Focal bronchiectasis / scarring medially in the left lung apex with  surrounding inflammatory changes  - Pneumovax 08/12/11  And prevnar 08/09/2016  - PFT's  08/09/2016  FEV1 2.05 (77 % ) ratio 70  p 5 % improvement from saba p dulera 100 x2  prior to study with DLCO  46 % corrects to 55  % for alv volume   - alpha one screening 03/27/2018  MM 267  - FOB/BAL 05/23/18 Pos Aspergillus > ID following -  12/13/2018  After extensive coaching inhaler device,  effectiveness =    75% (short ti)        Upper airway cough syndrome    Max rx for gerd trial basis 08/09/2016 >>>         HCAP (healthcare-associated pneumonia)    New density November 05 2016 where prev there was a cystic lesion > rx levaquin with marked improvement cxr 11/25/2016  > f/u cxr 06/16/2017 no def pna        CAP (community acquired pneumonia)    See cxr 11/22/2017 LLL > rx augmentin x 10 days       Emphysema lung ([place])   Cavitary lesion of lung in area of previous cyts in apex of Sup Segment of LLL    New since CT 01/18/18  assoc with acute onset L axillary cp 03/28/18 in area of previous blebs s a/f level c/w infected bleb rx augmentin x 10 days > improved at ov 04/11/2018 and 10 more days augmentin given - Quant TB 03/27/2018 neg  - ID eval 04/09/18 fungal serologies sent > neg  - 04/11/2018 rec another 10 days augmentin for ? Abscess forming LUL  - Following with ID ? Start fluconazole if not improving. Req records be sent to MD who treated [place] fever in '97    - CT 04/26/18 > Overall, there has been interval worsening in lung disease on the Left  - FOB Dr Windell [place] 05/24/19 with BAL pos for aspergillus > see ID rx          DOE (dyspnea on exertion)    05/02/2018  Walked RA x 3 laps @ 185 ft each stopped due to  End of study, fast pace, no   desat -  min sob            Allergies  Allergen Reactions  . Aspirin Anaphylaxis and Shortness Of Breath    THROAT CLOSES  . Protonix [Pantoprazole] Nausea Only and Other (See Comments)    Also caused a "film in the mouth" and caused chest pressure  . Iodinated Diagnostic Agents Rash    Immunization History  Administered Date(s) Administered  . Influenza Split 05/03/2011  . Influenza Whole 03/11/2012  . Influenza,inj,Quad PF,6+ Mos 07/29/2017, 04/08/2019  . Pneumococcal Conjugate-13 08/09/2016  . Pneumococcal-Unspecified 08/12/2011    Past Medical History:  Diagnosis Date  . Anemia   . Arthritis    knees and hips  . 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[place]) 02/26/2020  . Asthma   . Breast cancer ([place])   . Bronchiectasis ([place])   . Bronchiolitis   . Cancer [facility])    breast cancer 2014  . Cancer, metastatic to liver ([place])    2021  . Complication of anesthesia    bp dropped + desat   . COPD (chronic obstructive pulmonary disease) ([place])   . Dyspnea    DOE  . GERD (gastroesophageal reflux disease)   . H/O coccidioidomycosis    was reason for lung lobectomy  . Headache(784.0)    due to eye strain or not eating  . History of anemia    no current problem  . History of asthma    as a child  . History of breast cancer 2014   left  . History of chemotherapy    finished 07/17/2013  . History of hiatal hernia    AGE 43  . Hx of radiation therapy 03/25/13-05/06/13   left breast 5000 cGy/25 sessions, left breast boost 1000 cGy/5 sessions  . 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 Pneumonia    LAST FLARE UP 01/2018  . Runny nose 07/30/2013   clear drainage  . Wears dentures    upper    Tobacco History: Social History    Tobacco Use  Smoking Status Current Every Day Smoker  . Packs/day: 1.00  . Years: 24.00  . Pack years: 24.00  . Types: Cigarettes  Smokeless Tobacco Never Used  Tobacco Comment   .5ppd as of 03/05/20   Ready to quit: Not Answered Counseling given: Not Answered Comment: .5ppd as of 03/05/20   Continue to not smoke  Outpatient Encounter Medications as of 03/05/2020  Medication Sig  . albuterol (VENTOLIN HFA) 108 (90 Base) MCG/ACT inhaler Inhale 2 puffs into the lungs every 6 (six) hours as needed.  . budesonide-formoterol (SYMBICORT) 160-4.5 MCG/ACT inhaler Inhale 2 puffs into the lungs 2 (two) times daily.  . chlorpheniramine-HYDROcodone (TUSSIONEX) 10-8 MG/5ML SUER Take 5 mLs by mouth every 12 (twelve) hours as needed for cough.  . CRESEMBA 186 MG CAPS TAKE 2 CAPSULES BY MOUTH EVERY 24 HOURS  . levalbuterol (XOPENEX) 0.63 MG/3ML nebulizer solution Take 3 mLs (0.63 mg total) by nebulization every 4 (four) hours as needed for wheezing or shortness of breath.  . lidocaine-prilocaine (EMLA) cream Apply to affected area once  . Melatonin 5 MG TABS Take 5 mg by mouth at bedtime as needed.  . naproxen sodium (ALEVE) 220 MG tablet Take 440 mg by mouth 2 (two) times daily as needed (headache).   Marland Kitchen omeprazole (PRILOSEC) 40 MG capsule Take 1 capsule (40 mg total) by mouth daily.  . predniSONE (DELTASONE) 50 MG tablet Take 1 tablet 13 hours, 7 hours, and 1 hour prior to contrast (Patient not taking: Reported on 03/05/2020)  No facility-administered encounter medications on file as of 03/05/2020.     Review of Systems  Review of Systems  Constitutional: Positive for fatigue. Negative for activity change and fever.  HENT: Positive for congestion. Negative for sinus pressure, sinus pain and sore throat.   Respiratory: Positive for cough. Negative for shortness of breath and wheezing.   Cardiovascular: Negative for chest pain and palpitations.  Gastrointestinal: Negative for diarrhea, nausea  and vomiting.       ? gerd  Musculoskeletal: Negative for arthralgias.  Neurological: Negative for dizziness.  Psychiatric/Behavioral: Negative for sleep disturbance. The patient is not nervous/anxious.      Physical Exam  BP 104/68 (BP Location: Right Arm, Cuff Size: Normal)   Pulse 96   Ht 5' 2" (1.575 m)   Wt 113 lb 9.6 oz (51.5 kg)   LMP 07/25/2012 Comment: pregnancy waiver form signed 01-18-2018  SpO2 97%   BMI 20.78 kg/m   Wt Readings from Last 5 Encounters:  03/05/20 113 lb 9.6 oz (51.5 kg)  03/03/20 114 lb (51.7 kg)  02/26/20 116 lb (52.6 kg)  02/11/20 114 lb 12.8 oz (52.1 kg)  01/30/20 116 lb 14.4 oz (53 kg)    BMI Readings from Last 5 Encounters:  03/05/20 20.78 kg/m  03/03/20 20.85 kg/m  02/26/20 21.22 kg/m  02/11/20 21.00 kg/m  01/30/20 21.38 kg/m     Physical Exam Vitals and nursing note reviewed.  Constitutional:      General: She is not in acute distress.    Appearance: Normal appearance. She is normal weight.  HENT:     Head: Normocephalic and atraumatic.     Right Ear: Tympanic membrane, ear canal and external ear normal. There is no impacted cerumen.     Left Ear: Tympanic membrane, ear canal and external ear normal. There is no impacted cerumen.     Nose: Nose normal. No congestion.     Mouth/Throat:     Mouth: Mucous membranes are moist.     Pharynx: Oropharynx is clear.  Eyes:     Pupils: Pupils are equal, round, and reactive to light.  Cardiovascular:     Rate and Rhythm: Normal rate and regular rhythm.     Pulses: Normal pulses.     Heart sounds: Normal heart sounds. No murmur heard.   Pulmonary:     Effort: Pulmonary effort is normal. No respiratory distress.     Breath sounds: No decreased air movement. Examination of the left-upper field reveals decreased breath sounds. Examination of the left-lower field reveals wheezing. Decreased breath sounds and wheezing present. No rales.  Abdominal:     General: Abdomen is flat. Bowel  sounds are normal.     Palpations: Abdomen is soft.  Musculoskeletal:     Cervical back: Normal range of motion.  Skin:    General: Skin is warm and dry.     Capillary Refill: Capillary refill takes less than 2 seconds.  Neurological:     General: No focal deficit present.     Mental Status: She is alert and oriented to person, place, and time. Mental status is at baseline.     Gait: Gait normal.  Psychiatric:        Mood and Affect: Mood normal.        Behavior: Behavior normal.        Thought Content: Thought content normal.        Judgment: Judgment normal.       Assessment & Plan:   Obstructive  bronchiectasis ([place]) Plan: We will coordinate outpatient bronchoscopy with Dr. Valeta Harms Continue follow-up with infectious disease Continue flutter valve Continue Mucinex Work on reducing smoking   Emphysema lung ([place]) Plan: Continue Symbicort 160 We will establish care with Dr. 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 Valeta Harms as previously planned We will establish care with Dr. Valeta Harms in the outpatient setting  Healthcare maintenance Plan: Recommend stopping smoking Recommend COVID-19 vaccines Recommend seasonal flu vaccine  H/O coccidioidomycosis Plan: Continue follow-up with infectious disease We will establish care with Dr. Valeta Harms  Cigarette smoker Plan: Continue to work on reducing smoking Emphasized need to stop smoking   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[place]) Plan: Continue follow-up with infectious disease    Return in about 2 months (around 05/05/2020), or if symptoms  worsen or fail to improve, for Follow up with Dr. Valeta Harms, [place].   [name], NP 03/05/2020   This appointment required 42 minutes of patient care (this includes precharting, chart review, review of results, face-to-face care, etc.).

## 2020-03-05 NOTE — Patient Instructions (Addendum)
You were seen today by [name], NP  for:   1. Mycetoma 2. 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[place]) 3. H/O coccidioidomycosis  We will coordinate getting set up for bronchoscopy with Dr. Valeta Harms  Our office will coordinate outpatient Covid testing  Keep follow-up with infectious disease  We will also coordinate follow-up with Dr. Valeta Harms to establish care with him in the outpatient setting  4. Obstructive bronchiectasis (HCC)  Bronchiectasis: This is the medical term which indicates that you have damage, dilated airways making you more susceptible to respiratory infection. Use a flutter valve 10 breaths twice a day or 4 to 5 breaths 4-5 times a day to help clear mucus out Let us know if you have cough with change in mucus color or fevers or chills.  At that point you would need an antibiotic. Maintain a healthy nutritious diet, eating whole foods Take your medications as prescribed    5. Gastroesophageal reflux disease, unspecified whether esophagitis present  - omeprazole (PRILOSEC) 40 MG capsule; Take 1 capsule (40 mg total) by mouth daily.  Dispense: 30 capsule; Refill: 3  Omeprazole 40 mg tablet  >>>Please take 1 tablet daily 15 minutes to 30 minutes before your first meal of the day as well as before your other medications >>>Try to take at the same time each day >>>take this medication daily  GERD management: >>>Avoid laying flat until 2 hours after meals >>>Elevate head of the bed including entire chest >>>Reduce size of meals and amount of fat, acid, spices, caffeine and sweets >>>If you are smoking, Please stop! >>>Decrease alcohol consumption >>>Work on maintaining a healthy weight with normal BMI     6. Other emphysema ([place]) 7. Cigarette smoker  Continue Symbicort 160 >>> 2 puffs in the morning right when you wake up, rinse out your mouth after use, 12 hours later 2 puffs, rinse after use >>> Take this daily, no matter what >>> This is not a rescue inhaler   Only use your  albuterol as a rescue medication to be used if you can't catch your breath by resting or doing a relaxed purse lip breathing pattern.  - The less you use it, the better it will work when you need it. - Ok to use up to 2 puffs  every 4 hours if you must but call for immediate appointment if use goes up over your usual need - Don't leave home without it !!  (think of it like the spare tire for your car)    We recommend that you stop smoking.  >>>You need to set a quit date >>>If you have friends or family who smoke, let them know you are trying to quit and not to smoke around you or in your living environment  Smoking Cessation Resources:  1 800 QUIT NOW  >>> Patient to call this resource and utilize it to help support her quit smoking >>> Keep up your hard work with stopping smoking  You can also contact the [facility] >>>For smoking cessation classes call 8728830927  We do not recommend using e-cigarettes as a form of stopping smoking  You can sign up for smoking cessation support texts and information:  >>>https://smokefree.gov/smokefreetxt    8. 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 omeprazole (PRILOSEC) 40 MG capsule    Sig: Take 1 capsule (40 mg total) by mouth daily.  Dispense:  30 capsule    Refill:  3    Follow Up:    Return in about 2 months (around 05/05/2020), or if symptoms worsen or fail to improve, for Follow up with Dr. Valeta Harms, [place].   Please do your part to reduce the spread of COVID-19:      Reduce your risk of any infection  and COVID19 by using the similar precautions used for avoiding the common cold or flu:  Marland Kitchen Wash your hands often with soap and warm water for at least 20 seconds.  If soap and water are not readily available, use an alcohol-based hand sanitizer with at least 60% alcohol.  . If coughing or  sneezing, cover your mouth and nose by coughing or sneezing into the elbow areas of your shirt or coat, into a tissue or into your sleeve (not your hands). Langley Gauss A MASK when in public  . Avoid shaking hands with others and consider head nods or verbal greetings only. . Avoid touching your eyes, nose, or mouth with unwashed hands.  . Avoid close contact with people who are sick. . Avoid places or events with large numbers of people in one location, like concerts or sporting events. . If you have some symptoms but not all symptoms, continue to monitor at home and seek medical attention if your symptoms worsen. . If you are having a medical emergency, call 911.   Start / e-Visit: [email or web address]         [facility]: [facility]: [phone]                   [facility]: [phone]     It is flu season:   >>> Best ways to protect herself from the flu: Receive the yearly flu vaccine, practice good hand hygiene washing with soap and also using hand sanitizer when available, eat a nutritious meals, get adequate rest, hydrate appropriately   Please contact the office if your symptoms worsen or you have concerns that you are not improving.   Thank you for choosing [facility] for your healthcare, and for allowing [name] to partner with you on your healthcare journey. I am thankful to be able to provide care to you today.   Wyn Quaker FNP-C

## 2020-03-05 NOTE — Assessment & Plan Note (Signed)
Plan: Continue Symbicort 160 We will establish care with Dr. Valeta Harms Work on stopping smoking

## 2020-03-13 ENCOUNTER — Telehealth: Payer: Self-pay | Admitting: *Deleted

## 2020-03-13 DIAGNOSIS — Z5181 Encounter for therapeutic drug level monitoring: Secondary | ICD-10-CM

## 2020-03-13 DIAGNOSIS — B479 Mycetoma, unspecified: Secondary | ICD-10-CM

## 2020-03-13 NOTE — Progress Notes (Signed)
Nurse lvm with pre-op instructions according to pre-op checklist on pt phone. Nurse made pt aware to stop taking Melatonin, vitamins, fish oil and herbal medications. Do not take any NSAIDs ie: Ibuprofen, Advil, Naproxen (Aleve), Motrin, BC and Goody Powder. Nurse reminded pt to quarantine.

## 2020-03-13 NOTE — Telephone Encounter (Signed)
  Diagnosis: Mycetoma  Procedure: Navigational bronchoscopy  Anesthesia: Yes  Do you need Fluro? Yes  Priority: high  Date: 03/17/2020  Alternate Date:  Time: AM/ PM  Location: [facility]  Does patient have OSA? No DM? No or Latex allergy? No  Medication Restriction: None  Anticoagulate/Antiplatelet: None  Pre-op Labs Ordered: Recent CBC and CMP in chart  Imaging request: Super D CT reformatted from recent CT chest   (If, SuperDimension CT Chest, please have STAT courier sent to [place].)   Please coordinate Pre-op COVID Testing

## 2020-03-13 NOTE — Telephone Encounter (Signed)
-----   Message from Garner Nash, DO sent at 03/13/2020  1:40 PM EDT ----- Regarding: RE: Mycetomabronc scheduling Yes 9/7 ----- Message ----- From: Amado Coe, RN Sent: 03/12/2020   4:16 PM EDT To: [name], [name], NP, # Subject: RE: Mycetomabronc scheduling                  Please clarify the date? 9/7 ? ----- Message ----- From: Garner Nash, DO Sent: 03/10/2020   8:36 PM EDT To: Amado Coe, RN, [name], NP, # Subject: RE: Mycetomabronc scheduling                  Bronch details - I will plan this as a nav so we can be deliberate on trying to bx the cavity wall to send for tissue culture.   Diagnosis: Mycetoma Procedure: Navigational bronchoscopy  Anesthesia: Yes Do you need Fluro? Yes Priority: high  Date: 01/15/2020 Alternate Date: Time: AM/ PM Location: [facility]  Does patient have OSA?  No DM?  No or Latex allergy?  No Medication Restriction: None Anticoagulate/Antiplatelet: None Pre-op Labs Ordered: Recent CBC and CMP in chart Imaging request: Super D CT reformatted from recent CT chest   (If, SuperDimension CT Chest, please have STAT courier sent to [facility] [address])  Please coordinate Pre-op COVID Testing     ----- Message ----- From: [name], NP Sent: 03/05/2020   3:01 PM EDT To: Amado Coe, RN, Garner Nash, DO, # Subject: Mycetomabronc scheduling                      Dr. Valeta Harms and [name],Saw this patient in clinic today.  She needs to be scheduled for a bronchoscopy with Dr. Valeta Harms for further investigation of her mycetoma.  Dr. Valeta Harms please comment on the details below that way Melissa [name] can work on coordinating this being scheduled.  It looks like Dr. Juline Patch preference would be the second week of September.  I have already discussed this with the patient.  She will need outpatient Covid testing.Please schedule the following:  Diagnosis: Mycetoma Procedure: ## Anesthesia: ## Do  you need Fluro? ## Priority: Routine Date: Second week of September Alternate Date: ## Time: AM##/ PM## Location: ## Does patient have OSA?  No DM?  No or Latex allergy?  No Medication Restriction: None Anticoagulate/Antiplatelet: None Pre-op Labs Ordered: Recent CBC and CMP in chart Imaging request: ## (If, SuperDimension CT Chest, please have STAT courier sent to [facility].)  Please coordinate Pre-op COVID Testing     I tried to fill out the information the best my ability.  Please go ahead and add an additional comments Dr. Valeta Harms based off your preferences.Aaron Edelman

## 2020-03-14 ENCOUNTER — Other Ambulatory Visit (HOSPITAL_COMMUNITY)
Admission: RE | Admit: 2020-03-14 | Discharge: 2020-03-14 | Disposition: A | Discharge status: Home / Self Care | Payer: PRIVATE HEALTH INSURANCE | Source: Ambulatory Visit | Attending: Pulmonary Disease | Admitting: Pulmonary Disease

## 2020-03-14 DIAGNOSIS — Z01812 Encounter for preprocedural laboratory examination: Secondary | ICD-10-CM | POA: Insufficient documentation

## 2020-03-14 DIAGNOSIS — Z20822 Contact with and (suspected) exposure to covid-19: Secondary | ICD-10-CM | POA: Diagnosis not present

## 2020-03-14 LAB — SARS CORONAVIRUS 2 (TAT 6-24 HRS): SARS Coronavirus 2: NEGATIVE

## 2020-03-17 ENCOUNTER — Ambulatory Visit (HOSPITAL_COMMUNITY)
Admission: RE | Admit: 2020-03-17 | Discharge: 2020-03-17 | Disposition: A | Discharge status: Home / Self Care | Payer: PRIVATE HEALTH INSURANCE | Attending: Pulmonary Disease | Admitting: Pulmonary Disease

## 2020-03-17 ENCOUNTER — Encounter (HOSPITAL_COMMUNITY): Payer: Self-pay | Admitting: Pulmonary Disease

## 2020-03-17 ENCOUNTER — Other Ambulatory Visit: Payer: Self-pay

## 2020-03-17 ENCOUNTER — Ambulatory Visit (HOSPITAL_COMMUNITY): Payer: PRIVATE HEALTH INSURANCE | Admitting: Critical Care Medicine

## 2020-03-17 ENCOUNTER — Encounter (HOSPITAL_COMMUNITY)
Admission: RE | Disposition: A | Discharge status: Home / Self Care | Payer: Self-pay | Source: Home / Self Care | Attending: Pulmonary Disease

## 2020-03-17 DIAGNOSIS — K219 Gastro-esophageal reflux disease without esophagitis: Secondary | ICD-10-CM | POA: Insufficient documentation

## 2020-03-17 DIAGNOSIS — Z923 Personal history of irradiation: Secondary | ICD-10-CM | POA: Diagnosis not present

## 2020-03-17 DIAGNOSIS — I700 Atherosclerosis of aorta: Secondary | ICD-10-CM | POA: Insufficient documentation

## 2020-03-17 DIAGNOSIS — C7951 Secondary malignant neoplasm of bone: Secondary | ICD-10-CM | POA: Diagnosis not present

## 2020-03-17 DIAGNOSIS — J439 Emphysema, unspecified: Secondary | ICD-10-CM | POA: Insufficient documentation

## 2020-03-17 DIAGNOSIS — B479 Mycetoma, unspecified: Secondary | ICD-10-CM | POA: Diagnosis not present

## 2020-03-17 DIAGNOSIS — J984 Other disorders of lung: Secondary | ICD-10-CM | POA: Diagnosis not present

## 2020-03-17 DIAGNOSIS — F17210 Nicotine dependence, cigarettes, uncomplicated: Secondary | ICD-10-CM | POA: Insufficient documentation

## 2020-03-17 DIAGNOSIS — Z902 Acquired absence of lung [part of]: Secondary | ICD-10-CM | POA: Diagnosis not present

## 2020-03-17 DIAGNOSIS — Z7951 Long term (current) use of inhaled steroids: Secondary | ICD-10-CM | POA: Insufficient documentation

## 2020-03-17 DIAGNOSIS — C787 Secondary malignant neoplasm of liver and intrahepatic bile duct: Secondary | ICD-10-CM | POA: Insufficient documentation

## 2020-03-17 DIAGNOSIS — R0789 Other chest pain: Secondary | ICD-10-CM | POA: Insufficient documentation

## 2020-03-17 DIAGNOSIS — B449 Aspergillosis, unspecified: Secondary | ICD-10-CM | POA: Diagnosis not present

## 2020-03-17 DIAGNOSIS — Z9221 Personal history of antineoplastic chemotherapy: Secondary | ICD-10-CM | POA: Diagnosis not present

## 2020-03-17 DIAGNOSIS — Z853 Personal history of malignant neoplasm of breast: Secondary | ICD-10-CM | POA: Diagnosis not present

## 2020-03-17 DIAGNOSIS — R519 Headache, unspecified: Secondary | ICD-10-CM | POA: Insufficient documentation

## 2020-03-17 DIAGNOSIS — Z79899 Other long term (current) drug therapy: Secondary | ICD-10-CM | POA: Insufficient documentation

## 2020-03-17 DIAGNOSIS — R911 Solitary pulmonary nodule: Secondary | ICD-10-CM | POA: Diagnosis not present

## 2020-03-17 DIAGNOSIS — C7931 Secondary malignant neoplasm of brain: Secondary | ICD-10-CM | POA: Diagnosis not present

## 2020-03-17 HISTORY — PX: BRONCHIAL WASHINGS: SHX5105

## 2020-03-17 HISTORY — PX: BRONCHIAL BRUSHINGS: SHX5108

## 2020-03-17 HISTORY — PX: VIDEO BRONCHOSCOPY: SHX5072

## 2020-03-17 SURGERY — BRONCHOSCOPY, VIDEO-ASSISTED
Anesthesia: General

## 2020-03-17 MED ORDER — MIDAZOLAM HCL 5 MG/5ML IJ SOLN
INTRAMUSCULAR | Status: DC | PRN
  Administered 2020-03-17: 2 mg via INTRAVENOUS

## 2020-03-17 MED ORDER — DEXAMETHASONE SODIUM PHOSPHATE 10 MG/ML IJ SOLN
INTRAMUSCULAR | Status: DC | PRN
  Administered 2020-03-17: 4 mg via INTRAVENOUS

## 2020-03-17 MED ORDER — LACTATED RINGERS IV SOLN: INTRAVENOUS | Status: DC

## 2020-03-17 MED ORDER — PROPOFOL 10 MG/ML IV BOLUS
INTRAVENOUS | Status: DC | PRN
  Administered 2020-03-17: 100 mg via INTRAVENOUS

## 2020-03-17 MED ORDER — FENTANYL CITRATE (PF) 250 MCG/5ML IJ SOLN
INTRAMUSCULAR | Status: DC | PRN
Start: 2020-03-17 — End: 2020-03-17
  Administered 2020-03-17: 50 ug via INTRAVENOUS

## 2020-03-17 MED ORDER — SUGAMMADEX SODIUM 200 MG/2ML IV SOLN
INTRAVENOUS | Status: DC | PRN
  Administered 2020-03-17: 200 mg via INTRAVENOUS

## 2020-03-17 MED ORDER — LIDOCAINE 2% (20 MG/ML) 5 ML SYRINGE
INTRAMUSCULAR | Status: DC | PRN
  Administered 2020-03-17: 40 mg via INTRAVENOUS

## 2020-03-17 MED ORDER — ROCURONIUM BROMIDE 10 MG/ML (PF) SYRINGE
PREFILLED_SYRINGE | INTRAVENOUS | Status: DC | PRN
  Administered 2020-03-17: 50 mg via INTRAVENOUS

## 2020-03-17 MED ORDER — ONDANSETRON HCL 4 MG/2ML IJ SOLN
INTRAMUSCULAR | Status: DC | PRN
  Administered 2020-03-17: 4 mg via INTRAVENOUS

## 2020-03-17 SURGICAL SUPPLY — 46 items
ADAPTER BRONCH F/PENTAX (ADAPTER) ×4 IMPLANT
ADAPTER VALVE BIOPSY EBUS (MISCELLANEOUS) IMPLANT
ADPR BSCP EDG PNTX (ADAPTER) ×1
ADPTR VALVE BIOPSY EBUS (MISCELLANEOUS)
BRUSH CYTOL CELLEBRITY 1.5X140 (MISCELLANEOUS) ×4 IMPLANT
BRUSH SUPERTRAX BIOPSY (INSTRUMENTS) IMPLANT
BRUSH SUPERTRAX NDL-TIP CYTO (INSTRUMENTS) ×4 IMPLANT
CANISTER SUCT 3000ML PPV (MISCELLANEOUS) ×4 IMPLANT
CHANNEL WORK EXTEND EDGE 180 (KITS) IMPLANT
CHANNEL WORK EXTEND EDGE 45 (KITS) IMPLANT
CHANNEL WORK EXTEND EDGE 90 (KITS) IMPLANT
CONT SPEC 4OZ CLIKSEAL STRL BL (MISCELLANEOUS) ×4 IMPLANT
COVER BACK TABLE 60X90IN (DRAPES) ×4 IMPLANT
FILTER STRAW FLUID ASPIR (MISCELLANEOUS) IMPLANT
FORCEPS BIOP SUPERTRX PREMAR (INSTRUMENTS) ×4 IMPLANT
GAUZE SPONGE 4X4 12PLY STRL (GAUZE/BANDAGES/DRESSINGS) ×4 IMPLANT
GLOVE SURG SS PI 7.5 STRL IVOR (GLOVE) ×8 IMPLANT
GOWN STRL REUS W/ TWL LRG LVL3 (GOWN DISPOSABLE) ×4 IMPLANT
GOWN STRL REUS W/TWL LRG LVL3 (GOWN DISPOSABLE) ×8
KIT CLEAN ENDO COMPLIANCE (KITS) ×4 IMPLANT
KIT LOCATABLE GUIDE (CANNULA) IMPLANT
KIT MARKER FIDUCIAL DELIVERY (KITS) IMPLANT
KIT PROCEDURE EDGE 180 (KITS) IMPLANT
KIT PROCEDURE EDGE 45 (KITS) IMPLANT
KIT PROCEDURE EDGE 90 (KITS) IMPLANT
KIT TURNOVER KIT B (KITS) ×4 IMPLANT
MARKER SKIN DUAL TIP RULER LAB (MISCELLANEOUS) ×4 IMPLANT
NEEDLE SUPERTRX PREMARK BIOPSY (NEEDLE) ×4 IMPLANT
NS IRRIG 1000ML POUR BTL (IV SOLUTION) ×4 IMPLANT
OIL SILICONE PENTAX (PARTS (SERVICE/REPAIRS)) ×4 IMPLANT
PAD ARMBOARD 7.5X6 YLW CONV (MISCELLANEOUS) ×8 IMPLANT
PATCHES PATIENT (LABEL) ×12 IMPLANT
SOL ANTI FOG 6CC (MISCELLANEOUS) ×2 IMPLANT
SOLUTION ANTI FOG 6CC (MISCELLANEOUS) ×2
SYR 20CC LL (SYRINGE) ×4 IMPLANT
SYR 20ML ECCENTRIC (SYRINGE) ×4 IMPLANT
SYR 50ML SLIP (SYRINGE) ×4 IMPLANT
TOWEL OR 17X24 6PK STRL BLUE (TOWEL DISPOSABLE) ×4 IMPLANT
TRAP SPECIMEN MUCOUS 40CC (MISCELLANEOUS) IMPLANT
TUBE CONNECTING 20'X1/4 (TUBING) ×1
TUBE CONNECTING 20X1/4 (TUBING) ×3 IMPLANT
UNDERPAD 30X30 (UNDERPADS AND DIAPERS) ×4 IMPLANT
VALVE BIOPSY  SINGLE USE (MISCELLANEOUS) ×4
VALVE BIOPSY SINGLE USE (MISCELLANEOUS) ×2 IMPLANT
VALVE SUCTION BRONCHIO DISP (MISCELLANEOUS) ×4 IMPLANT
WATER STERILE IRR 1000ML POUR (IV SOLUTION) ×4 IMPLANT

## 2020-03-17 NOTE — Transfer of Care (Signed)
Immediate Anesthesia Transfer of Care Note  Patient: Melissa [name]  Procedure(s) Performed: VIDEO BRONCHOSCOPY (N/A ) BRONCHIAL WASHINGS BRONCHIAL BRUSHINGS  Patient Location: Endoscopy Unit  Anesthesia Type:General  Level of Consciousness: awake, alert  and oriented  Airway & Oxygen Therapy: Patient Spontanous Breathing and Patient connected to nasal cannula oxygen  Post-op Assessment: Report given to RN and Post -op Vital signs reviewed and stable  Post vital signs: Reviewed and stable  Last Vitals:  Vitals Value Taken Time  BP 103/46 03/17/20 1224  Temp    Pulse 77 03/17/20 1225  Resp 15 03/17/20 1225  SpO2 96 % 03/17/20 1225  Vitals shown include unvalidated device data.  Last Pain:  Vitals:   03/17/20 1223  TempSrc:   PainSc: 0-No pain      Patients Stated Pain Goal: 5 (67/42/55 2589)  Complications: No complications documented.

## 2020-03-17 NOTE — Interval H&P Note (Signed)
History and Physical Interval Note:  03/17/2020 11:20 AM  Melissa [name]  has presented today for surgery, with the diagnosis of MYCETOMA.  The various methods of treatment have been discussed with the patient and family. After consideration of risks, benefits and other options for treatment, the patient has consented to  Procedure(s): Watson (N/A) as a surgical intervention.  The patient's history has been reviewed, patient examined, no change in status, stable for surgery.  I have reviewed the patient's chart and labs.  Questions were answered to the patient's satisfaction.    Today we dicussed risks, benefits and alternatives of proceeding with bronchoscopy. Patient was agreeable to proceed. No barriers to proceed. Discussed risks of bleeding, pneumothorax and death.   [place]

## 2020-03-17 NOTE — Anesthesia Preprocedure Evaluation (Signed)
Anesthesia Evaluation  Patient identified by MRN, date of birth, ID band Patient awake    Reviewed: Allergy & Precautions, H&P , NPO status , Patient's Chart, lab work & pertinent test results  Airway Mallampati: II   Neck ROM: full    Dental   Pulmonary shortness of breath, asthma , COPD, Current Smoker,    breath sounds clear to auscultation       Cardiovascular + DOE   Rhythm:regular Rate:Normal     Neuro/Psych  Headaches,    GI/Hepatic hiatal hernia, GERD  ,  Endo/Other    Renal/GU      Musculoskeletal  (+) Arthritis ,   Abdominal   Peds  Hematology   Anesthesia Other Findings   Reproductive/Obstetrics H/o breast CA                             Anesthesia Physical Anesthesia Plan  ASA: III  Anesthesia Plan: General   Post-op Pain Management:    Induction: Intravenous  PONV Risk Score and Plan: 2 and Ondansetron, Dexamethasone, Midazolam and Treatment may vary due to age or medical condition  Airway Management Planned: Oral ETT  Additional Equipment:   Intra-op Plan:   Post-operative Plan: Extubation in OR  Informed Consent: I have reviewed the patients History and Physical, chart, labs and discussed the procedure including the risks, benefits and alternatives for the proposed anesthesia with the patient or authorized representative who has indicated his/her understanding and acceptance.       Plan Discussed with: CRNA, Anesthesiologist and Surgeon  Anesthesia Plan Comments:         Anesthesia Quick Evaluation

## 2020-03-17 NOTE — Discharge Instructions (Signed)
Flexible Bronchoscopy, Care After This sheet gives you information about how to care for yourself after your test. Your doctor may also give you more specific instructions. If you have problems or questions, contact your doctor. Follow these instructions at home: Eating and drinking  The day after the test, go back to your normal diet. Driving  Do not drive for 24 hours if you were given a medicine to help you relax (sedative).  Do not drive or use heavy machinery while taking prescription pain medicine. General instructions   Take over-the-counter and prescription medicines only as told by your doctor.  Return to your normal activities as told. Ask what activities are safe for you.  Do not use any products that have nicotine or tobacco in them. This includes cigarettes and e-cigarettes. If you need help quitting, ask your doctor.  Keep all follow-up visits as told by your doctor. This is important. It is very important if you had a tissue sample (biopsy) taken. Get help right away if:  You have shortness of breath that gets worse.  You get light-headed.  You feel like you are going to pass out (faint).  You have chest pain.  You cough up: ? More than a little blood. ? More blood than before. Summary  Do not eat or drink anything (not even water) for 2 hours after your test, or until your numbing medicine wears off.  Do not use cigarettes. Do not use e-cigarettes.  Get help right away if you have chest pain. This information is not intended to replace advice given to you by your health care provider. Make sure you discuss any questions you have with your health care provider. Document Revised: 06/09/2017 Document Reviewed: 07/15/2016 Elsevier Patient Education  2020 [employer].

## 2020-03-17 NOTE — Op Note (Addendum)
Video Bronchoscopy Procedure Note  Date of Operation: 03/17/2020  Pre-op Diagnosis: Left cavitary lesion  Post-op Diagnosis: Left cavitary lesion  Surgeon: Garner Nash, DO   Assistants: none  Anesthesia: General anesthesia  Meds Given: Per anesthesia record  Operation: Flexible video fiberoptic bronchoscopy and biopsies.  Estimated Blood Loss: Less than 1 cc   Complications: none noted  Indications and History: Melissa [name] is 54 y.o. 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  Recommendation was to perform video fiberoptic bronchoscopy with biopsies. The risks, benefits, complications, treatment options and expected outcomes were discussed with the patient.  The possibilities of pneumothorax, pneumonia, reaction to medication, pulmonary aspiration, perforation of a viscus, bleeding, failure to diagnose a condition and creating a complication requiring transfusion or operation were discussed with the patient who freely signed the consent.    Description of Procedure: The patient was seen in the Preoperative Area, was examined and was deemed appropriate to proceed.  The patient was taken to [facility] endoscopy room 2, identified as Melissa [name] and the procedure verified as Flexible Video Fiberoptic Bronchoscopy.  A Time Out was held and the above information confirmed.   Visual inspection of the bilateral lungs.  The right lung anatomy appears normal.  No evidence of endobronchial lesion.  The left lung shows the same left upper lobe resection.  Visible suture line of the left upper lobe.  The left lower lobe intact with no evidence of endobronchial lesion.  There was yellow-tinged colored mucus plugging up the superior segment of the left lower lobe division.  A BAL was obtained from the left lower lobe superior segment with 60 cc of saline and moderate return.  Additionally a sterile Micro biology brush was used to obtain specimen  from the left lower lobe superior segment.  There was a small amount of mucus coming from the anterior segment of the right upper lobe.  An additional BAL was obtained from the right upper lobe for cultures.  Samples: 1.  BAL left lower lobe superior segment 2.  BAL right upper lobe anterior segment 3. LLL micro brush   Plans:  We will review the microbiology results with the patient when they become available.  Outpatient followup will be with Garner Nash, DO  Garner Nash, DO [facility] 03/17/2020 12:10 PM

## 2020-03-17 NOTE — Anesthesia Procedure Notes (Signed)
Procedure Name: Intubation Date/Time: 03/17/2020 11:44 AM Performed by: Wilburn Cornelia, CRNA Pre-anesthesia Checklist: Patient identified, Emergency Drugs available, Suction available, Patient being monitored and Timeout performed Patient Re-evaluated:Patient Re-evaluated prior to induction Oxygen Delivery Method: Circle system utilized Preoxygenation: Pre-oxygenation with 100% oxygen Induction Type: IV induction Ventilation: Mask ventilation without difficulty Laryngoscope Size: Mac and 3 Grade View: Grade I Tube type: Oral Tube size: 8.5 mm Number of attempts: 1 Airway Equipment and Method: Stylet Placement Confirmation: ETT inserted through vocal cords under direct vision,  positive ETCO2,  CO2 detector and breath sounds checked- equal and bilateral Secured at: 21 cm Tube secured with: Tape Dental Injury: Teeth and Oropharynx as per pre-operative assessment

## 2020-03-18 ENCOUNTER — Ambulatory Visit: Payer: PRIVATE HEALTH INSURANCE | Admitting: Internal Medicine

## 2020-03-18 NOTE — Anesthesia Postprocedure Evaluation (Signed)
Anesthesia Post Note  Patient: Melissa [name]  Procedure(s) Performed: VIDEO BRONCHOSCOPY (N/A ) BRONCHIAL WASHINGS BRONCHIAL BRUSHINGS     Patient location during evaluation: PACU Anesthesia Type: General Level of consciousness: awake and alert Pain management: pain level controlled Vital Signs Assessment: post-procedure vital signs reviewed and stable Respiratory status: spontaneous breathing, nonlabored ventilation, respiratory function stable and patient connected to nasal cannula oxygen Cardiovascular status: blood pressure returned to baseline and stable Postop Assessment: no apparent nausea or vomiting Anesthetic complications: no   No complications documented.  Last Vitals:  Vitals:   03/17/20 1242 03/17/20 1252  BP: (!) 104/44 (!) 111/46  Pulse: 72 73  Resp: 18 19  Temp:    SpO2: 98% 95%    Last Pain:  Vitals:   03/17/20 1252  TempSrc:   PainSc: 0-No pain                 Astria Jordahl S

## 2020-03-19 ENCOUNTER — Encounter (HOSPITAL_COMMUNITY): Payer: Self-pay | Admitting: Pulmonary Disease

## 2020-03-19 LAB — ACID FAST SMEAR (AFB, MYCOBACTERIA): Acid Fast Smear: NEGATIVE

## 2020-03-19 LAB — CULTURE, RESPIRATORY W GRAM STAIN

## 2020-03-20 ENCOUNTER — Telehealth: Payer: Self-pay | Admitting: Pulmonary Disease

## 2020-03-20 LAB — ACID FAST SMEAR (AFB, MYCOBACTERIA)
Acid Fast Smear: NEGATIVE
Acid Fast Smear: NEGATIVE

## 2020-03-20 LAB — CULTURE, RESPIRATORY W GRAM STAIN
Culture: NO GROWTH
Gram Stain: NONE SEEN

## 2020-03-20 NOTE — Telephone Encounter (Signed)
Called and spoke with pt. Pt is requesting to know the results of the bronch that was performed 9/7. Dr. Valeta Harms, please advise.

## 2020-03-22 LAB — ANAEROBIC CULTURE

## 2020-03-23 LAB — ANAEROBIC CULTURE: Gram Stain: NONE SEEN

## 2020-03-23 NOTE — Telephone Encounter (Signed)
Bronchoscopy culture results have not finalized  We are still awaiting the fungal and AFB results.  This can sometimes take several weeks.  Thanks  Garner Nash, DO [facility] 03/23/2020 3:25 PM

## 2020-03-23 NOTE — Telephone Encounter (Signed)
Pt returning a phone call about endoscopy results. Pt can be reached at [phone].

## 2020-03-23 NOTE — Progress Notes (Signed)
Patient Care Team: Patient, [name] as PCP - General (General Practice) Melissa Ripple, MD as Referring Physician (Emergency Medicine)  DIAGNOSIS:    ICD-10-CM   1. 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 Malignant neoplasm of upper-inner quadrant of left breast in female, estrogen receptor negative ([place])  C50.212    Z17.1     SUMMARY OF ONCOLOGIC HISTORY: Oncology History  Breast cancer of upper-inner quadrant of left female breast ([place])  06/08/2012 Initial Diagnosis   invasive ductal carcinoma that was ER positive PR positive HER-2/neu positive measuring 3.1 cm by MRI criteria. Ki-67 was 70% HER-2 was amplified with a ratio 2.91   07/12/2012 - 07/17/2013 Neo-Adjuvant Chemotherapy   TCH 6 followed by Herceptin maintenance   12/11/2012 Surgery   Left breast lumpectomy: 1.8 cm tumor 1 positive sentinel node, axillary lymph node dissection 02/08/2013 showed 0/13 lymph nodes   03/25/2013 - 05/06/2013 Radiation Therapy   Adjuvant radiation therapy   06/05/2013 - 07/20/2017 Anti-estrogen oral therapy   Tamoxifen 20 mg daily   07/27/2017 Relapse/Recurrence   MRI Brain: 3.4 x 2.9 x 2.9 cm RIGHT frontal lobe mass with imaging characteristics of solitary metastasis. Extensive vasogenic edema resulting in 9 mm RIGHT to LEFT midline shift. Equivocal very early LEFT ventricle entrapment.    08/04/2017 Surgery   Rt frontal brain resection: Poorly differentiated tumor IHC suggests breast primary ER and PR Positive   08/25/2017 - 09/04/2017 Radiation Therapy   Stereotactic radiation   09/18/2017 -  Anti-estrogen oral therapy   Lapatinib with letrozole   12/18/2017 - 12/19/2017 Radiation Therapy   New right parietal lobe metastases status post [facility]   05/09/2019 Relapse/Recurrence   Interval increase in size of the enhancing nodular left internal mammary soft tissue 2.8 cm.  Redemonstrated enlarged supraclavicular, lower cervical and lower posterior cervical  nodes unchanged.  Interval increase in the bony erosion of the posterior and lateral left third rib, increasing soft tissue lesion eroding the left sternal body 3.1 cm was 2.5 cm.  Bronchiectatic changes   06/19/2019 -  Chemotherapy   The patient had ado-trastuzumab emtansine (KADCYLA) 180 mg in sodium chloride 0.9 % 250 mL chemo infusion, 3.6 mg/kg = 180 mg, Intravenous, Once, 13 of 15 cycles Administration: 180 mg (06/19/2019), 180 mg (07/10/2019), 180 mg (09/17/2019), 180 mg (08/05/2019), 180 mg (08/27/2019), 180 mg (10/08/2019), 180 mg (10/29/2019), 180 mg (11/19/2019), 200 mg (12/10/2019), 200 mg (12/31/2019), 200 mg (01/20/2020), 200 mg (02/11/2020), 200 mg (03/03/2020)  for chemotherapy treatment.    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      CHIEF COMPLIANT: Follow-up of metastatic breast cancer  INTERVAL HISTORY: Melissa [name] is a 54 y.o. with above-mentioned history of metastatic breast cancer with brain metastasiss/presection who is currently [place] every 3 weeks.She presents to the clinic todayfortreatment.  She had a bronchoscopy for aspergillosis and to follow the initial  results were negative for acid-fast organisms aerobic or anaerobic bacteria or fungal organisms.  Since the bronchoscopy she has felt chills and thought she may be developing a pneumonia but she is better off today.  ALLERGIES:  is allergic to  aspirin, protonix [pantoprazole], and iodinated diagnostic agents.  MEDICATIONS:  Current Outpatient Medications  Medication Sig Dispense Refill  . albuterol (VENTOLIN HFA) 108 (90 Base) MCG/ACT inhaler Inhale 2 puffs into the lungs every 6 (six) hours as needed. 8 g 5  . budesonide-formoterol (SYMBICORT) 160-4.5 MCG/ACT inhaler Inhale 2 puffs into the lungs 2 (two) times daily. 3 Inhaler 3  . chlorpheniramine-HYDROcodone (TUSSIONEX) 10-8 MG/5ML SUER Take 5 mLs by mouth every 12 (twelve) hours as needed for cough. (Patient taking differently: Take 5 mLs by mouth at bedtime. ) 240 mL 0  . CRESEMBA 186 MG CAPS TAKE 2 CAPSULES BY MOUTH EVERY 24 HOURS (Patient taking differently: Take 372 mg by mouth daily. ) 56 capsule 2  . levalbuterol (XOPENEX) 0.63 MG/3ML nebulizer solution Take 3 mLs (0.63 mg total) by nebulization every 4 (four) hours as needed for wheezing or shortness of breath. 540 mL 6  . lidocaine-prilocaine (EMLA) cream Apply to affected area once (Patient taking differently: Apply 1 application topically daily as needed (port access). ) 30 g 3  . Melatonin 5 MG TABS Take 5 mg by mouth at bedtime as needed (sleep).     . naproxen sodium (ALEVE) 220 MG tablet Take 440 mg by mouth 2 (two) times daily as needed (headache).     Marland Kitchen omeprazole (PRILOSEC) 40 MG capsule Take 1 capsule (40 mg total) by mouth daily. 30 capsule 3   No current facility-administered medications for this visit.    PHYSICAL EXAMINATION: ECOG PERFORMANCE STATUS: 1 - Symptomatic but completely ambulatory  Vitals:   03/24/20 1040  BP: 109/69  Pulse: 83  Resp: 16  Temp: 98.2 F (36.8 C)  SpO2: 100%   Filed Weights   03/24/20 1040  Weight: 112 lb 12.8 oz (51.2 kg)    LABORATORY DATA:  I have reviewed the data as listed CMP Latest Ref Rng & Units 03/24/2020 03/03/2020 02/11/2020  Glucose 70 - 99 mg/dL 89 77 102(H)  BUN 6 - 20 mg/dL _0 Creatinine 0.44 - 1.00 mg/dL 0.78 0.86 0.89  Sodium 135 - 145 mmol/L  134(L) 137 135  Potassium 3.5 - 5.1 mmol/L 3.9 3.9 3.7  Chloride 98 - 111 mmol/L 100 102 102  CO2 22 - 32 mmol/L _1 Calcium 8.9 - 10.3 mg/dL 9.7 10.4(H) 9.9  Total Protein 6.5 - 8.1 g/dL 8.4(H) 8.1 8.1  Total Bilirubin 0.3 - 1.2 mg/dL 0.2(L) <0.2(L) <0.2(L)  Alkaline Phos 38 - 126 U/L 171(H) 142(H) 143(H)  AST 15 - 41 U/L _2 ALT 0 - 44 U/L _3 Lab Results  Component Value Date   WBC 12.5 (H) 03/24/2020   HGB 10.1 (L) 03/24/2020   HCT 30.8 (L) 03/24/2020   MCV 89.3 03/24/2020   PLT 343 03/24/2020   NEUTROABS 8.4 (H) 03/24/2020    ASSESSMENT & PLAN:  Breast cancer of upper-inner quadrant of left female breast (HCC) 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 Anti Her 2 therapy with Lapatinibstarted 09/17/2017-05/14/2019: Stopped for progression 3.I discontinuedtamoxifen and started her on letrozole 2.5 mg daily.05/14/2019 stopped for progression 4.Stereotactic radiosurgery 12/19/2017 to the new right parietal lobe metastases. -------------------------------------------------------------------------------------------------------------------- Liver Biopsy 06/05/19: Metastatic cancer, ER/PR: 0%, Her 2: 3+ Positive, Ki 67: 20% Patient has metastases to liver, bone, brain, and questionably lung  Current treatment: Kadcyla every 3 weeks, today is cycle13 Kadcyla toxicities: 1.Occasional diarrhea 2.mild peripheral neuropathy 3.Normocytic anemia: Monitoring. Received blood transfusion in July 2021.  Today's hemoglobin  is 10.1.  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  Bronchoscopy 03/17/2020: Most of the results from the bronchoscopy so far are negative for bacterial or fungal organisms. She has appointments with infectious disease and pulmonary to discuss this further.  Return to clinic every 3 weeks for Kadcyla every 6 weeks to follow-up    No orders of the defined types were placed in this encounter.  The patient has a good understanding of the overall plan. she agrees with it. she will call with any problems that may develop before the next visit here.  Total time spent: 30 mins including face to face time and time spent for planning, charting and coordination of care  Nicholas Lose, MD 03/24/2020  I, Cloyde Reams Dorshimer, am acting as scribe for Dr. Nicholas Lose.  I have reviewed the above documentation for accuracy and completeness, and I agree with the above.

## 2020-03-23 NOTE — Telephone Encounter (Signed)
Called and spoke with patient she states that has got some results though mychart that's why she was questioning the results she was not sure what they meant. She also states that since she had the procedure she has not been able to take in a full breath without her left side killing her. Informed her that I would pass along message and that once we got all the final results back we would be in tough with her to go over them. Patient expressed understanding. Will route to Dr. Valeta Harms as an Juluis Rainier

## 2020-03-24 ENCOUNTER — Inpatient Hospital Stay: Payer: PRIVATE HEALTH INSURANCE

## 2020-03-24 ENCOUNTER — Inpatient Hospital Stay: Payer: PRIVATE HEALTH INSURANCE | Attending: Medical

## 2020-03-24 ENCOUNTER — Other Ambulatory Visit: Payer: Self-pay | Admitting: Hematology and Oncology

## 2020-03-24 ENCOUNTER — Inpatient Hospital Stay (HOSPITAL_BASED_OUTPATIENT_CLINIC_OR_DEPARTMENT_OTHER): Payer: PRIVATE HEALTH INSURANCE | Admitting: Hematology and Oncology

## 2020-03-24 ENCOUNTER — Other Ambulatory Visit: Payer: Self-pay

## 2020-03-24 VITALS — BP 109/69 | HR 83 | Temp 98.2°F | Resp 16 | Ht 62.0 in | Wt 112.8 lb

## 2020-03-24 DIAGNOSIS — C50212 Malignant neoplasm of upper-inner quadrant of left female breast: Secondary | ICD-10-CM | POA: Diagnosis not present

## 2020-03-24 DIAGNOSIS — Z5112 Encounter for antineoplastic immunotherapy: Secondary | ICD-10-CM | POA: Insufficient documentation

## 2020-03-24 DIAGNOSIS — C7931 Secondary malignant neoplasm of brain: Secondary | ICD-10-CM | POA: Insufficient documentation

## 2020-03-24 DIAGNOSIS — Z171 Estrogen receptor negative status [ER-]: Secondary | ICD-10-CM

## 2020-03-24 DIAGNOSIS — C7951 Secondary malignant neoplasm of bone: Secondary | ICD-10-CM | POA: Diagnosis not present

## 2020-03-24 DIAGNOSIS — C50912 Malignant neoplasm of unspecified site of left female breast: Secondary | ICD-10-CM

## 2020-03-24 DIAGNOSIS — Z170 Estrogen receptor positive status [ER+]: Secondary | ICD-10-CM | POA: Insufficient documentation

## 2020-03-24 DIAGNOSIS — C787 Secondary malignant neoplasm of liver and intrahepatic bile duct: Secondary | ICD-10-CM | POA: Diagnosis present

## 2020-03-24 DIAGNOSIS — Z95828 Presence of other vascular implants and grafts: Secondary | ICD-10-CM

## 2020-03-24 LAB — CBC WITH DIFFERENTIAL/PLATELET
Abs Immature Granulocytes: 0.03 10*3/uL (ref 0.00–0.07)
Basophils Absolute: 0.1 10*3/uL (ref 0.0–0.1)
Basophils Relative: 0 %
Eosinophils Absolute: 0.9 10*3/uL — ABNORMAL HIGH (ref 0.0–0.5)
Eosinophils Relative: 7 %
HCT: 30.8 % — ABNORMAL LOW (ref 36.0–46.0)
Hemoglobin: 10.1 g/dL — ABNORMAL LOW (ref 12.0–15.0)
Immature Granulocytes: 0 %
Lymphocytes Relative: 18 %
Lymphs Abs: 2.2 10*3/uL (ref 0.7–4.0)
MCH: 29.3 pg (ref 26.0–34.0)
MCHC: 32.8 g/dL (ref 30.0–36.0)
MCV: 89.3 fL (ref 80.0–100.0)
Monocytes Absolute: 1 10*3/uL (ref 0.1–1.0)
Monocytes Relative: 8 %
Neutro Abs: 8.4 10*3/uL — ABNORMAL HIGH (ref 1.7–7.7)
Neutrophils Relative %: 67 %
Platelets: 343 10*3/uL (ref 150–400)
RBC: 3.45 MIL/uL — ABNORMAL LOW (ref 3.87–5.11)
RDW: 21.5 % — ABNORMAL HIGH (ref 11.5–15.5)
WBC: 12.5 10*3/uL — ABNORMAL HIGH (ref 4.0–10.5)
nRBC: 0 % (ref 0.0–0.2)

## 2020-03-24 LAB — COMPREHENSIVE METABOLIC PANEL
ALT: 18 U/L (ref 0–44)
AST: 27 U/L (ref 15–41)
Albumin: 2.6 g/dL — ABNORMAL LOW (ref 3.5–5.0)
Alkaline Phosphatase: 171 U/L — ABNORMAL HIGH (ref 38–126)
Anion gap: 6 (ref 5–15)
BUN: 8 mg/dL (ref 6–20)
CO2: 28 mmol/L (ref 22–32)
Calcium: 9.7 mg/dL (ref 8.9–10.3)
Chloride: 100 mmol/L (ref 98–111)
Creatinine, Ser: 0.78 mg/dL (ref 0.44–1.00)
GFR calc Af Amer: >60 mL/min (ref >60)
GFR calc non Af Amer: >60 mL/min (ref >60)
Glucose, Bld: 89 mg/dL (ref 70–99)
Potassium: 3.9 mmol/L (ref 3.5–5.1)
Sodium: 134 mmol/L — ABNORMAL LOW (ref 135–145)
Total Bilirubin: 0.2 mg/dL — ABNORMAL LOW (ref 0.3–1.2)
Total Protein: 8.4 g/dL — ABNORMAL HIGH (ref 6.5–8.1)

## 2020-03-24 MED ORDER — HEPARIN SOD (PORK) LOCK FLUSH 100 UNIT/ML IV SOLN
500.0000 [IU] | Freq: Once | INTRAVENOUS | Status: AC | PRN
  Administered 2020-03-24: 500 [IU]
  Filled 2020-03-24: qty 5

## 2020-03-24 MED ORDER — ACETAMINOPHEN 325 MG PO TABS
ORAL_TABLET | ORAL | Status: AC
  Filled 2020-03-24: qty 2

## 2020-03-24 MED ORDER — ACETAMINOPHEN 325 MG PO TABS
650.0000 mg | ORAL_TABLET | Freq: Once | ORAL | Status: AC
  Administered 2020-03-24: 650 mg via ORAL

## 2020-03-24 MED ORDER — SODIUM CHLORIDE 0.9 % IV SOLN
Freq: Once | INTRAVENOUS | Status: AC
  Filled 2020-03-24: qty 250

## 2020-03-24 MED ORDER — SODIUM CHLORIDE 0.9% FLUSH
10.0000 mL | INTRAVENOUS | Status: DC | PRN
  Administered 2020-03-24: 10 mL
  Filled 2020-03-24: qty 10

## 2020-03-24 MED ORDER — DIPHENHYDRAMINE HCL 25 MG PO CAPS
25.0000 mg | ORAL_CAPSULE | Freq: Once | ORAL | Status: AC
  Administered 2020-03-24: 25 mg via ORAL

## 2020-03-24 MED ORDER — SODIUM CHLORIDE 0.9 % IV SOLN
200.0000 mg | Freq: Once | INTRAVENOUS | Status: AC
  Administered 2020-03-24: 200 mg via INTRAVENOUS
  Filled 2020-03-24: qty 10

## 2020-03-24 MED ORDER — DIPHENHYDRAMINE HCL 25 MG PO CAPS
ORAL_CAPSULE | ORAL | Status: AC
  Filled 2020-03-24: qty 2

## 2020-03-24 MED ORDER — HYDROCOD POLST-CPM POLST ER 10-8 MG/5ML PO SUER: 5.0000 mL | Freq: Every day | ORAL | 0 refills | Status: DC

## 2020-03-24 NOTE — Telephone Encounter (Signed)
PCCM:  I tried calling the patient back. There was no answer and went straight to voicemail. I assume my number is coming through as an unknown caller ID.   I sent her a Pharmacist, community message. Hopefully we can communicate that way.   Garner Nash, DO [facility] 03/24/2020 4:43 PM

## 2020-03-24 NOTE — Assessment & Plan Note (Signed)
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 Anti Her 2 therapy with Lapatinibstarted 09/17/2017-05/14/2019: Stopped for progression 3.I discontinuedtamoxifen and started her on letrozole 2.5 mg daily.05/14/2019 stopped for progression 4.Stereotactic radiosurgery 12/19/2017 to the new right parietal lobe metastases. -------------------------------------------------------------------------------------------------------------------- Liver Biopsy 06/05/19: Metastatic cancer, ER/PR: 0%, Her 2: 3+ Positive, Ki 67: 20% Patient has metastases to liver, bone, brain, and questionably lung  Current treatment: Kadcyla every 3 weeks, today is cycle13 Kadcyla toxicities: 1.Occasional diarrhea 2.mild peripheral neuropathy 3.Normocytic anemia: Monitoring. Received blood transfusion in July 2021.  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  Bronchoscopy 03/17/2020: Pending results   Return to clinic every 3 weeks for Kadcyla every 6 weeks to follow-up

## 2020-03-25 ENCOUNTER — Telehealth: Payer: Self-pay | Admitting: Hematology and Oncology

## 2020-03-25 NOTE — Telephone Encounter (Signed)
No 9/14 los, no changes made to pt schedule

## 2020-04-01 ENCOUNTER — Encounter: Payer: Self-pay | Admitting: Radiation Oncology

## 2020-04-01 NOTE — Progress Notes (Signed)
Completed [employer] paperwork to continue patient's disability claim. Placed paperwork in [employer], PA-C's inbox to sign. Will fax completed paperwork once signed paperwork is returned to this RN by Ashlyn.

## 2020-04-02 ENCOUNTER — Encounter: Payer: Self-pay | Admitting: Infectious Disease

## 2020-04-02 ENCOUNTER — Ambulatory Visit (INDEPENDENT_AMBULATORY_CARE_PROVIDER_SITE_OTHER): Payer: PRIVATE HEALTH INSURANCE | Admitting: Infectious Disease

## 2020-04-02 ENCOUNTER — Other Ambulatory Visit: Payer: Self-pay

## 2020-04-02 VITALS — BP 111/73 | HR 109 | Wt 112.0 lb

## 2020-04-02 DIAGNOSIS — F17210 Nicotine dependence, cigarettes, uncomplicated: Secondary | ICD-10-CM | POA: Diagnosis not present

## 2020-04-02 DIAGNOSIS — B479 Mycetoma, unspecified: Secondary | ICD-10-CM | POA: Diagnosis not present

## 2020-04-02 DIAGNOSIS — Z8619 Personal history of other infectious and parasitic diseases: Secondary | ICD-10-CM

## 2020-04-02 DIAGNOSIS — J479 Bronchiectasis, uncomplicated: Secondary | ICD-10-CM

## 2020-04-02 DIAGNOSIS — B449 Aspergillosis, unspecified: Secondary | ICD-10-CM

## 2020-04-02 DIAGNOSIS — C50212 Malignant neoplasm of upper-inner quadrant of left female breast: Secondary | ICD-10-CM

## 2020-04-02 DIAGNOSIS — Z171 Estrogen receptor negative status [ER-]: Secondary | ICD-10-CM

## 2020-04-02 MED ORDER — CRESEMBA 186 MG PO CAPS
ORAL_CAPSULE | ORAL | 11 refills | Status: DC
Start: 2020-04-02 — End: 2020-04-29

## 2020-04-02 NOTE — Progress Notes (Signed)
Subjective:  Complaint: still coughing   Patient ID: Melissa [name], female    DOB: Feb 10, 1966, 54 y.o.   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  Along the way she was diagnosed with breast cancer 2013 underwent lumpectomy and chemotherapy.  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  She is following with Dr. Lindi Adie with oncology.  She is subsequent been found to have other metastases including to her sternum and liver.  She developed cavitary lung infection and underwent bronchoscopy which revealed Aspergillus fumigatus.  He has been on Cresemba since then.  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  She also has some spiculated lesions in the posterior left lobe lobe of her lung.  Radiology is concerned that the apparent mycetoma is due to Aspergillus or another mold.  There is similar concerned that the spiculated areas could represent Aspergillus though we have no culture data to guide [name].  She continued to currently remain on Cresemba.  She does continue to smoke.  She has had bronchoscopy with BAL and biopsies though I cannot find any pathology report, micro reports are unrevealing so far.    Past Medical History:  Diagnosis Date  . Anemia   . Arthritis    knees and hips  . 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 Asthma   . Breast cancer ([place])   . Bronchiectasis ([place])   . Bronchiolitis   . Cancer [facility])    breast cancer 2014  . Cancer, metastatic to liver ([place])    2021  . Complication of anesthesia    bp dropped + desat   .  COPD (chronic obstructive pulmonary disease) ([place])   . Dyspnea    DOE  . GERD (gastroesophageal reflux disease)   . H/O coccidioidomycosis    was reason for lung lobectomy  . Headache(784.0)    due to eye strain or not eating  . History of anemia    no current problem  . History of asthma    as a child  . History of breast cancer 2014   left  . History of chemotherapy    finished 07/17/2013  . History of hiatal hernia    AGE 65  . Hx of radiation therapy 03/25/13-05/06/13   left breast 5000 cGy/25 sessions, left breast boost 1000 cGy/5 sessions  . 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 Pneumonia    LAST FLARE UP 01/2018  . Runny nose 07/30/2013   clear drainage  . Wears dentures    upper    Past Surgical History:  Procedure Laterality Date  . APPLICATION OF CRANIAL NAVIGATION N/A 08/04/2017   Procedure: APPLICATION OF CRANIAL NAVIGATION;  Surgeon: [name], [name], MD;  Location: [place];  Service: Neurosurgery;  Laterality: N/A;  . AXILLARY LYMPH NODE DISSECTION Left 02/08/2013   Procedure: LEFT AXILLARY DISSECTION;  Surgeon: Edward Jolly, MD;  Location: [place];  Service: General;  Laterality: Left;  . BREAST CYST EXCISION Right 2006  . BREAST LUMPECTOMY Left 2014  . BREAST LUMPECTOMY WITH NEEDLE LOCALIZATION AND AXILLARY SENTINEL LYMPH  NODE BX Left 12/31/2012   Procedure: NEEDLE LOCALIZATION LEFT BREAST LUMPECTOMY AND LEFT AXILLARY SENTENIAL LYMPH NODE BX;  Surgeon: Edward Jolly, MD;  Location: [place];  Service: General;  Laterality: Left;  . BRONCHIAL BRUSHINGS  03/17/2020   Procedure: BRONCHIAL BRUSHINGS;  Surgeon: Garner Nash, DO;  Location: [place] [facility];  Service: Pulmonary;;  . BRONCHIAL WASHINGS  03/17/2020   Procedure: BRONCHIAL WASHINGS;  Surgeon: Garner Nash, DO;  Location: [place] ENDOSCOPY;  Service: Pulmonary;;  . CESAREAN SECTION  1995/1996  . CRANIOTOMY Right 08/04/2017   Procedure: Right Frontal craniotomy for resection of  tumor with stereotactic navigation;  Surgeon: [name], [name], MD;  Location: [place];  Service: Neurosurgery;  Laterality: Right;  Right Frontal craniotomy for resection of tumor with stereotactic navigation  . IR IMAGING GUIDED PORT INSERTION  05/31/2019  . LUNG LOBECTOMY Left 05/1996   upper lobe - due to [facility] Fever  . PORT-A-CATH REMOVAL Right 08/02/2013   Procedure: REMOVAL PORT-A-CATH;  Surgeon: Edward Jolly, MD;  Location: [place];  Service: General;  Laterality: Right;  . PORTACATH PLACEMENT  07/02/2012   Procedure: INSERTION PORT-A-CATH;  Surgeon: Edward Jolly, MD;  Location: [place];  Service: General;  Laterality: N/A;  right  . VIDEO BRONCHOSCOPY N/A 03/17/2020   Procedure: VIDEO BRONCHOSCOPY;  Surgeon: Garner Nash, DO;  Location: [place] [facility];  Service: Pulmonary;  Laterality: N/A;  . VIDEO BRONCHOSCOPY WITH ENDOBRONCHIAL ULTRASOUND N/A 05/23/2018   Procedure: VIDEO BRONCHOSCOPY WITH ENDOBRONCHIAL ULTRASOUND;  Surgeon: Garner Nash, DO;  [facility];  Service: Thoracic;  Laterality: N/A;    Family History  Problem Relation Age of Onset  . Emphysema Mother        was a smoker  . Heart disease Mother   . Melanoma Mother        dx in her 7s  . Breast cancer Mother 56  . Asthma Brother   . Breast cancer Cousin        mother's maternal cousin; dx in her 71s      Social History   Socioeconomic History  . Marital status: Married    Spouse name: Not on file  . Number of children: 2  . Years of education: Not on file  . Highest education level: Not on file  Occupational History  . Occupation: Secondary school teacher  Tobacco Use  . Smoking status: Current Every Day Smoker    Packs/day: 1.00    Years: 24.00    Pack years: 24.00    Types: Cigarettes  . Smokeless tobacco: Never Used  . Tobacco comment: .5ppd as of 03/05/20  Vaping Use  . Vaping Use: Never used  Substance and Sexual Activity  . Alcohol use: No   . Drug use: No  . Sexual activity: Not Currently  Other Topics Concern  . Not on file  Social History Narrative  . Not on file   Social Determinants of Health   Financial Resource Strain:   . Difficulty of Paying Living Expenses: Not on file  Food Insecurity:   . Worried About Charity fundraiser in the Last Year: Not on file  . Ran Out of Food in the Last Year: Not on file  Transportation Needs:   . Lack of Transportation (Medical): Not on file  . Lack of Transportation (Non-Medical): Not on file  Physical Activity:   . Days of Exercise per Week: Not on file  . Minutes of  Exercise per Session: Not on file  Stress:   . Feeling of Stress : Not on file  Social Connections:   . Frequency of Communication with Friends and Family: Not on file  . Frequency of Social Gatherings with Friends and Family: Not on file  . Attends Religious Services: Not on file  . Active Member of Clubs or Organizations: Not on file  . Attends Archivist Meetings: Not on file  . Marital Status: Not on file    Allergies  Allergen Reactions  . Aspirin Anaphylaxis and Shortness Of Breath    THROAT CLOSES  . Protonix [Pantoprazole] Nausea Only and Other (See Comments)    Also caused a "film in the mouth" and caused chest pressure  . Iodinated Diagnostic Agents Rash     Current Outpatient Medications:  .  albuterol (VENTOLIN HFA) 108 (90 Base) MCG/ACT inhaler, Inhale 2 puffs into the lungs every 6 (six) hours as needed., Disp: 8 g, Rfl: 5 .  budesonide-formoterol (SYMBICORT) 160-4.5 MCG/ACT inhaler, Inhale 2 puffs into the lungs 2 (two) times daily., Disp: 3 Inhaler, Rfl: 3 .  chlorpheniramine-HYDROcodone (TUSSIONEX) 10-8 MG/5ML SUER, Take 5 mLs by mouth at bedtime., Disp: 473 mL, Rfl: 0 .  CRESEMBA 186 MG CAPS, TAKE 2 CAPSULES BY MOUTH EVERY 24 HOURS (Patient taking differently: Take 372 mg by mouth daily. ), Disp: 56 capsule, Rfl: 2 .  levalbuterol (XOPENEX) 0.63 MG/3ML nebulizer solution,  Take 3 mLs (0.63 mg total) by nebulization every 4 (four) hours as needed for wheezing or shortness of breath., Disp: 540 mL, Rfl: 6 .  lidocaine-prilocaine (EMLA) cream, Apply to affected area once (Patient taking differently: Apply 1 application topically daily as needed (port access). ), Disp: 30 g, Rfl: 3 .  naproxen sodium (ALEVE) 220 MG tablet, Take 440 mg by mouth 2 (two) times daily as needed (headache). , Disp: , Rfl:  .  omeprazole (PRILOSEC) 40 MG capsule, Take 1 capsule (40 mg total) by mouth daily., Disp: 30 capsule, Rfl: 3 .  Melatonin 5 MG TABS, Take 5 mg by mouth at bedtime as needed (sleep).  (Patient not taking: Reported on 04/02/2020), Disp: , Rfl:    Review of Systems  Constitutional: Negative for activity change, appetite change, chills, diaphoresis, fatigue, fever and unexpected weight change.  HENT: Positive for sore throat. Negative for congestion, rhinorrhea, sinus pressure, sneezing and trouble swallowing.   Eyes: Negative for photophobia and visual disturbance.  Respiratory: Positive for cough and shortness of breath. Negative for chest tightness, wheezing and stridor.   Cardiovascular: Negative for chest pain, palpitations and leg swelling.  Gastrointestinal: Negative for abdominal distention, abdominal pain, anal bleeding, blood in stool, constipation, diarrhea, nausea and vomiting.  Genitourinary: Negative for difficulty urinating, dysuria, flank pain and hematuria.  Musculoskeletal: Negative for arthralgias, back pain, gait problem, joint swelling and myalgias.  Skin: Negative for color change, pallor, rash and wound.  Neurological: Negative for dizziness, tremors, facial asymmetry, weakness and light-headedness.  Hematological: Negative for adenopathy. Does not bruise/bleed easily.  Psychiatric/Behavioral: Negative for agitation, behavioral problems, confusion, decreased concentration, dysphoric mood and sleep disturbance.       Objective:   Physical  Exam Constitutional:      General: She is not in acute distress.    Appearance: Normal appearance. She is well-developed. She is not ill-appearing or diaphoretic.  HENT:     Head: Normocephalic and atraumatic.     Right Ear: Hearing and external ear normal.     Left Ear:  Hearing and external ear normal.     Nose: No nasal deformity or rhinorrhea.  Eyes:     General: No scleral icterus.    Conjunctiva/sclera: Conjunctivae normal.     Right eye: Right conjunctiva is not injected.     Left eye: Left conjunctiva is not injected.     Pupils: Pupils are equal, round, and reactive to light.  Neck:     Vascular: No JVD.  Cardiovascular:     Rate and Rhythm: Normal rate and regular rhythm.     Heart sounds: Normal heart sounds, S1 normal and S2 normal. No murmur heard.  No friction rub. No gallop.   Pulmonary:     Effort: Prolonged expiration present.     Breath sounds: No stridor. Wheezing present. No rhonchi.  Abdominal:     General: Bowel sounds are normal. There is no distension.     Palpations: Abdomen is soft.     Tenderness: There is no abdominal tenderness.  Musculoskeletal:        General: Normal range of motion.     Right shoulder: Normal.     Left shoulder: Normal.     Cervical back: Normal range of motion and neck supple.     Right hip: Normal.     Left hip: Normal.     Right knee: Normal.     Left knee: Normal.  Lymphadenopathy:     Head:     Right side of head: No submandibular, preauricular or posterior auricular adenopathy.     Left side of head: No submandibular, preauricular or posterior auricular adenopathy.     Cervical: No cervical adenopathy.     Right cervical: No superficial or deep cervical adenopathy.    Left cervical: No superficial or deep cervical adenopathy.  Skin:    General: Skin is warm and dry.     Coloration: Skin is not pale.     Findings: No abrasion, bruising, ecchymosis, erythema, lesion or rash.     Nails: There is no clubbing.   Neurological:     Mental Status: She is alert and oriented to person, place, and time.     Sensory: No sensory deficit.     Coordination: Coordination normal.     Gait: Gait normal.  Psychiatric:        Attention and Perception: Attention and perception normal. She is attentive.        Mood and Affect: Affect normal.        Speech: Speech normal.        Behavior: Behavior normal. Behavior is cooperative.        Thought Content: Thought content normal.        Cognition and Memory: Cognition and memory normal.        Judgment: Judgment normal.           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  History of coccidiomycosis: She is still very worried about this but I have more anxiety about her Aspergillus  Metastatic breast cancer: following with Dr Lindi Adie  COVID prevention: she still does not want the vaccine

## 2020-04-06 ENCOUNTER — Telehealth: Payer: Self-pay | Admitting: Radiation Oncology

## 2020-04-06 NOTE — Telephone Encounter (Signed)
Faxed complete and signed CONTINUING DISABILITY CLAIM FORM to [facility] at [phone]. Fax confirmation of delivery obtained. Mailed copy to patient as well.

## 2020-04-08 ENCOUNTER — Telehealth: Payer: Self-pay

## 2020-04-08 NOTE — Telephone Encounter (Signed)
Pt is calling stating she is will be sending patient assistance application for her Cresemba to our office to be filled. Shefter  out her portion of the paper work first before sending it. [place] [facility] [place]

## 2020-04-08 NOTE — Telephone Encounter (Signed)
Patient called office today stating she has not received her Cresemba for this month. Would like to confirm pharmacy on file to ensure it was sent to the correct one. Provided patient with pharmacy information and advised she call regarding refill. [place]

## 2020-04-13 LAB — MYCOBACTERIA,CULT W/FLUOROCHROME SMEAR
MICRO NUMBER:: 10845553
SMEAR:: NONE SEEN
SPECIMEN QUALITY:: ADEQUATE

## 2020-04-13 NOTE — Progress Notes (Signed)
Patient Care Team: Patient, [name] as PCP - General (General Practice) [name], MD as Referring Physician (Emergency Medicine)  DIAGNOSIS:    ICD-10-CM   1. Malignant neoplasm of upper-inner quadrant of left breast in female, estrogen receptor negative ([place])  C50.212 chlorpheniramine-HYDROcodone ([place]) 10-8 MG/5ML SUER   Z17.1   2. 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 Ki-67 was 70% HER-2 was amplified with a ratio 2.91   07/12/2012 - 07/17/2013 Neo-Adjuvant Chemotherapy   TCH 6 followed by Herceptin maintenance   12/11/2012 Surgery   Left breast lumpectomy: 1.8 cm tumor 1 positive sentinel node, axillary lymph node dissection 02/08/2013 showed 0/13 lymph nodes   03/25/2013 - 05/06/2013 Radiation Therapy   Adjuvant radiation therapy   06/05/2013 - 07/20/2017 Anti-estrogen oral therapy   Tamoxifen 20 mg daily   07/27/2017 Relapse/Recurrence   MRI Brain: 3.4 x 2.9 x 2.9 cm RIGHT frontal lobe mass with imaging characteristics of solitary metastasis. Extensive vasogenic edema resulting in 9 mm RIGHT to LEFT midline shift. Equivocal very early LEFT ventricle entrapment.    08/04/2017 Surgery   Rt frontal brain resection: Poorly differentiated tumor IHC suggests breast primary ER and PR Positive   08/25/2017 - 09/04/2017 Radiation Therapy   Stereotactic radiation   09/18/2017 -  Anti-estrogen oral therapy   Lapatinib with letrozole   12/18/2017 - 12/19/2017 Radiation Therapy   New right parietal lobe metastases status post [facility]   05/09/2019 Relapse/Recurrence   Interval increase in size of the enhancing nodular left internal  mammary soft tissue 2.8 cm.  Redemonstrated enlarged supraclavicular, lower cervical and lower posterior cervical nodes unchanged.  Interval increase in the bony erosion of the posterior and lateral left third rib, increasing soft tissue lesion eroding the left sternal body 3.1 cm was 2.5 cm.  Bronchiectatic changes   06/19/2019 -  Chemotherapy   The patient had ado-trastuzumab emtansine (KADCYLA) 180 mg in sodium chloride 0.9 % 250 mL chemo infusion, 3.6 mg/kg = 180 mg, Intravenous, Once, 14 of 15 cycles Administration: 180 mg (06/19/2019), 180 mg (07/10/2019), 180 mg (09/17/2019), 180 mg (08/05/2019), 180 mg (08/27/2019), 180 mg (10/08/2019), 180 mg (10/29/2019), 180 mg (11/19/2019), 200 mg (12/10/2019), 200 mg (12/31/2019), 200 mg (01/20/2020), 200 mg (02/11/2020), 200 mg (03/03/2020), 200 mg (03/24/2020)  for chemotherapy treatment.    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      CHIEF COMPLIANT: Follow-up of metastatic breast cancer  INTERVAL HISTORY: Melissa [name] is a 54 y.o. with above-mentioned history of metastatic breast cancer with brain metastasiss/presection who is currently [place] every 3 weeks.She presents to the  clinic todayfortreatment.  ALLERGIES:  is allergic to aspirin, protonix [pantoprazole], and iodinated diagnostic agents.  MEDICATIONS:  Current Outpatient Medications  Medication Sig  Dispense Refill  . albuterol (VENTOLIN HFA) 108 (90 Base) MCG/ACT inhaler Inhale 2 puffs into the lungs every 6 (six) hours as needed. 8 g 5  . budesonide-formoterol (SYMBICORT) 160-4.5 MCG/ACT inhaler Inhale 2 puffs into the lungs 2 (two) times daily. 3 Inhaler 3  . chlorpheniramine-HYDROcodone (TUSSIONEX) 10-8 MG/5ML SUER Take 5 mLs by mouth at bedtime. 473 mL 0  . Isavuconazonium Sulfate (CRESEMBA) 186 MG CAPS TAKE 2 CAPSULES BY MOUTH EVERY 24 HOURS 56 capsule 11  . levalbuterol (XOPENEX) 0.63 MG/3ML nebulizer solution Take 3 mLs (0.63 mg total) by nebulization every 4 (four) hours as needed for wheezing or shortness of breath. 540 mL 6  . lidocaine-prilocaine (EMLA) cream Apply to affected area once (Patient taking differently: Apply 1 application topically daily as needed (port access). ) 30 g 3  . Melatonin 5 MG TABS Take 5 mg by mouth at bedtime as needed (sleep).  (Patient not taking: Reported on 04/02/2020)    . naproxen sodium (ALEVE) 220 MG tablet Take 440 mg by mouth 2 (two) times daily as needed (headache).     Marland Kitchen omeprazole (PRILOSEC) 40 MG capsule Take 1 capsule (40 mg total) by mouth daily. 30 capsule 3   No current facility-administered medications for this visit.    PHYSICAL EXAMINATION: ECOG PERFORMANCE STATUS: 1 - Symptomatic but completely ambulatory  Vitals:   04/14/20 0902  BP: 106/73  Pulse: 76  Resp: 19  Temp: (!) 97 F (36.1 C)  SpO2: 97%   Filed Weights   04/14/20 0902  Weight: 109 lb 6.4 oz (49.6 kg)    LABORATORY DATA:  I have reviewed the data as listed CMP Latest Ref Rng & Units 03/24/2020 03/03/2020 02/11/2020  Glucose 70 - 99 mg/dL 89 77 102(H)  BUN 6 - 20 mg/dL _0 Creatinine 0.44 - 1.00 mg/dL 0.78 0.86 0.89  Sodium 135 - 145 mmol/L 134(L) 137 135  Potassium 3.5 - 5.1 mmol/L 3.9 3.9 3.7  Chloride 98 - 111 mmol/L 100 102 102  CO2 22 - 32 mmol/L _1 Calcium 8.9 - 10.3 mg/dL 9.7 10.4(H) 9.9  Total Protein 6.5 - 8.1 g/dL 8.4(H) 8.1 8.1   Total Bilirubin 0.3 - 1.2 mg/dL 0.2(L) <0.2(L) <0.2(L)  Alkaline Phos 38 - 126 U/L 171(H) 142(H) 143(H)  AST 15 - 41 U/L _2 ALT 0 - 44 U/L _3 Lab Results  Component Value Date   WBC 7.9 04/14/2020   HGB 9.7 (L) 04/14/2020   HCT 29.7 (L) 04/14/2020   MCV 90.5 04/14/2020   PLT 249 04/14/2020   NEUTROABS 4.6 04/14/2020    ASSESSMENT & PLAN:  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 Anti Her 2 therapy with Lapatinibstarted 09/17/2017-05/14/2019: Stopped for progression 3.I discontinuedtamoxifen and started her on letrozole 2.5 mg daily.05/14/2019 stopped for progression 4.Stereotactic radiosurgery 12/19/2017 to the new right parietal lobe metastases. -------------------------------------------------------------------------------------------------------------------- Liver Biopsy 06/05/19: Metastatic cancer,  ER/PR: 0%, Her 2: 3+ Positive, Ki 67: 20% Patient has metastases to liver, bone, brain, and questionably lung  Current treatment: Kadcyla every 3 weeks, today is cycle15 Kadcyla toxicities: 1.Occasional diarrhea 2.mild peripheral neuropathy 3.Normocytic anemia: Monitoring. Received blood transfusion in July 2021.  Today's hemoglobin is 10.1.  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 Bronchoscopy 03/17/2020: Most of the results from the bronchoscopy so far  are negative for bacterial or fungal organisms. She has appointments with infectious disease and pulmonary to discuss this further.  I renewed her prescription for Tussionex cough syrup she gets intense cough when she lies down because of the lung aspergillosis. One of the AFB test came back positive for acid-fast bacteria.  She is going to see a pulmonary tomorrow to discuss this result.  We will plan to obtain scans every 6 months. Return to clinic every 3 weeks for Kadcyla      No orders of the defined types were placed in this encounter.  The patient has a good understanding of the overall plan. she agrees with it. she will call with any problems that may develop before the next visit here.  Total time spent: 30 mins including face to face time and time spent for planning, charting and coordination of care  Nicholas Lose, MD 04/14/2020  I, Cloyde Reams Dorshimer, am acting as scribe for Dr. Nicholas Lose.  I have reviewed the above documentation for accuracy and completeness, and I agree with the above.

## 2020-04-14 ENCOUNTER — Inpatient Hospital Stay (HOSPITAL_BASED_OUTPATIENT_CLINIC_OR_DEPARTMENT_OTHER): Payer: PRIVATE HEALTH INSURANCE | Admitting: Hematology and Oncology

## 2020-04-14 ENCOUNTER — Inpatient Hospital Stay: Payer: PRIVATE HEALTH INSURANCE

## 2020-04-14 ENCOUNTER — Inpatient Hospital Stay: Payer: PRIVATE HEALTH INSURANCE | Attending: Medical

## 2020-04-14 ENCOUNTER — Other Ambulatory Visit: Payer: Self-pay

## 2020-04-14 VITALS — BP 98/58 | HR 75 | Temp 97.9°F | Resp 18

## 2020-04-14 VITALS — BP 106/73 | HR 76 | Temp 97.0°F | Resp 19 | Ht 62.0 in | Wt 109.4 lb

## 2020-04-14 DIAGNOSIS — C50212 Malignant neoplasm of upper-inner quadrant of left female breast: Secondary | ICD-10-CM

## 2020-04-14 DIAGNOSIS — Z5112 Encounter for antineoplastic immunotherapy: Secondary | ICD-10-CM | POA: Diagnosis present

## 2020-04-14 DIAGNOSIS — C7931 Secondary malignant neoplasm of brain: Secondary | ICD-10-CM | POA: Insufficient documentation

## 2020-04-14 DIAGNOSIS — C7951 Secondary malignant neoplasm of bone: Secondary | ICD-10-CM | POA: Insufficient documentation

## 2020-04-14 DIAGNOSIS — Z170 Estrogen receptor positive status [ER+]: Secondary | ICD-10-CM | POA: Diagnosis not present

## 2020-04-14 DIAGNOSIS — C787 Secondary malignant neoplasm of liver and intrahepatic bile duct: Secondary | ICD-10-CM | POA: Diagnosis present

## 2020-04-14 DIAGNOSIS — C50912 Malignant neoplasm of unspecified site of left female breast: Secondary | ICD-10-CM

## 2020-04-14 DIAGNOSIS — Z171 Estrogen receptor negative status [ER-]: Secondary | ICD-10-CM

## 2020-04-14 DIAGNOSIS — Z95828 Presence of other vascular implants and grafts: Secondary | ICD-10-CM

## 2020-04-14 LAB — CBC WITH DIFFERENTIAL/PLATELET
Abs Immature Granulocytes: 0.02 10*3/uL (ref 0.00–0.07)
Basophils Absolute: 0.1 10*3/uL (ref 0.0–0.1)
Basophils Relative: 1 %
Eosinophils Absolute: 0.7 10*3/uL — ABNORMAL HIGH (ref 0.0–0.5)
Eosinophils Relative: 9 %
HCT: 29.7 % — ABNORMAL LOW (ref 36.0–46.0)
Hemoglobin: 9.7 g/dL — ABNORMAL LOW (ref 12.0–15.0)
Immature Granulocytes: 0 %
Lymphocytes Relative: 24 %
Lymphs Abs: 1.9 10*3/uL (ref 0.7–4.0)
MCH: 29.6 pg (ref 26.0–34.0)
MCHC: 32.7 g/dL (ref 30.0–36.0)
MCV: 90.5 fL (ref 80.0–100.0)
Monocytes Absolute: 0.6 10*3/uL (ref 0.1–1.0)
Monocytes Relative: 8 %
Neutro Abs: 4.6 10*3/uL (ref 1.7–7.7)
Neutrophils Relative %: 58 %
Platelets: 249 10*3/uL (ref 150–400)
RBC: 3.28 MIL/uL — ABNORMAL LOW (ref 3.87–5.11)
RDW: 20.7 % — ABNORMAL HIGH (ref 11.5–15.5)
WBC: 7.9 10*3/uL (ref 4.0–10.5)
nRBC: 0 % (ref 0.0–0.2)

## 2020-04-14 LAB — COMPREHENSIVE METABOLIC PANEL
ALT: 18 U/L (ref 0–44)
AST: 25 U/L (ref 15–41)
Albumin: 2.8 g/dL — ABNORMAL LOW (ref 3.5–5.0)
Alkaline Phosphatase: 146 U/L — ABNORMAL HIGH (ref 38–126)
Anion gap: 8 (ref 5–15)
BUN: 8 mg/dL (ref 6–20)
CO2: 28 mmol/L (ref 22–32)
Calcium: 9.9 mg/dL (ref 8.9–10.3)
Chloride: 102 mmol/L (ref 98–111)
Creatinine, Ser: 0.84 mg/dL (ref 0.44–1.00)
GFR calc non Af Amer: >60 mL/min (ref >60)
Glucose, Bld: 84 mg/dL (ref 70–99)
Potassium: 3.8 mmol/L (ref 3.5–5.1)
Sodium: 138 mmol/L (ref 135–145)
Total Bilirubin: <0.2 mg/dL — ABNORMAL LOW (ref 0.3–1.2)
Total Protein: 8.5 g/dL — ABNORMAL HIGH (ref 6.5–8.1)

## 2020-04-14 MED ORDER — DIPHENHYDRAMINE HCL 25 MG PO CAPS
ORAL_CAPSULE | ORAL | Status: AC
  Filled 2020-04-14: qty 1

## 2020-04-14 MED ORDER — ACETAMINOPHEN 325 MG PO TABS
650.0000 mg | ORAL_TABLET | Freq: Once | ORAL | Status: AC
  Administered 2020-04-14: 650 mg via ORAL

## 2020-04-14 MED ORDER — SODIUM CHLORIDE 0.9% FLUSH
10.0000 mL | INTRAVENOUS | Status: DC | PRN
  Administered 2020-04-14: 10 mL
  Filled 2020-04-14: qty 10

## 2020-04-14 MED ORDER — ACETAMINOPHEN 325 MG PO TABS
ORAL_TABLET | ORAL | Status: AC
  Filled 2020-04-14: qty 2

## 2020-04-14 MED ORDER — HYDROCOD POLST-CPM POLST ER 10-8 MG/5ML PO SUER: 5.0000 mL | Freq: Every day | ORAL | 0 refills | Status: DC

## 2020-04-14 MED ORDER — SODIUM CHLORIDE 0.9 % IV SOLN
200.0000 mg | Freq: Once | INTRAVENOUS | Status: AC
  Administered 2020-04-14: 200 mg via INTRAVENOUS
  Filled 2020-04-14: qty 10

## 2020-04-14 MED ORDER — SODIUM CHLORIDE 0.9 % IV SOLN
Freq: Once | INTRAVENOUS | Status: AC
  Filled 2020-04-14: qty 250

## 2020-04-14 MED ORDER — HEPARIN SOD (PORK) LOCK FLUSH 100 UNIT/ML IV SOLN
500.0000 [IU] | Freq: Once | INTRAVENOUS | Status: AC | PRN
  Administered 2020-04-14: 500 [IU]
  Filled 2020-04-14: qty 5

## 2020-04-14 MED ORDER — DIPHENHYDRAMINE HCL 25 MG PO CAPS
25.0000 mg | ORAL_CAPSULE | Freq: Once | ORAL | Status: AC
  Administered 2020-04-14: 25 mg via ORAL

## 2020-04-14 NOTE — Patient Instructions (Signed)
[facility] Discharge Instructions for Patients Receiving Chemotherapy  Today you received the following chemotherapy agents Kadcyla  To help prevent nausea and vomiting after your treatment, we encourage you to take your nausea medication as directed   If you develop nausea and vomiting that is not controlled by your nausea medication, call the clinic.   BELOW ARE SYMPTOMS THAT SHOULD BE REPORTED IMMEDIATELY:  *FEVER GREATER THAN 100.5 F  *CHILLS WITH OR WITHOUT FEVER  NAUSEA AND VOMITING THAT IS NOT CONTROLLED WITH YOUR NAUSEA MEDICATION  *UNUSUAL SHORTNESS OF BREATH  *UNUSUAL BRUISING OR BLEEDING  TENDERNESS IN MOUTH AND THROAT WITH OR WITHOUT PRESENCE OF ULCERS  *URINARY PROBLEMS  *BOWEL PROBLEMS  UNUSUAL RASH Items with * indicate a potential emergency and should be followed up as soon as possible.  Feel free to call the clinic should you have any questions or concerns. The clinic phone number is [phone].  Please show the CHEMO ALERT CARD at check-in to the Emergency Department and triage nurse.   

## 2020-04-14 NOTE — Patient Instructions (Signed)

## 2020-04-14 NOTE — Assessment & Plan Note (Signed)
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 Anti Her 2 therapy with Lapatinibstarted 09/17/2017-05/14/2019: Stopped for progression 3.I discontinuedtamoxifen and started her on letrozole 2.5 mg daily.05/14/2019 stopped for progression 4.Stereotactic radiosurgery 12/19/2017 to the new right parietal lobe metastases. -------------------------------------------------------------------------------------------------------------------- Liver Biopsy 06/05/19: Metastatic cancer, ER/PR: 0%, Her 2: 3+ Positive, Ki 67: 20% Patient has metastases to liver, bone, brain, and questionably lung  Current treatment: Kadcyla every 3 weeks, today is cycle13 Kadcyla toxicities: 1.Occasional diarrhea 2.mild peripheral neuropathy 3.Normocytic anemia: Monitoring. Received blood transfusion in July 2021.  Today's hemoglobin is 10.1.  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 Bronchoscopy 03/17/2020: Most of the results from the bronchoscopy so far are negative for bacterial or fungal organisms. She has appointments with infectious disease and pulmonary to discuss this further.  Return to clinic every 3 weeks for Kadcyla every 6 weeks to follow-up

## 2020-04-16 ENCOUNTER — Other Ambulatory Visit: Payer: Self-pay | Admitting: Radiation Therapy

## 2020-04-16 ENCOUNTER — Telehealth: Payer: Self-pay | Admitting: Hematology and Oncology

## 2020-04-16 DIAGNOSIS — C7949 Secondary malignant neoplasm of other parts of nervous system: Secondary | ICD-10-CM

## 2020-04-16 NOTE — Telephone Encounter (Signed)
Scheduled per 10/5 los. Pt will receive an updated appt calendar per appt notes

## 2020-04-19 NOTE — Progress Notes (Signed)
Synopsis: Referred in November 2019 for bronchoscopy evaluation by Dr. Melvyn Novas.  Subjective:   PATIENT ID: Melissa [name] GENDER: female DOB: 03/30/1966, MRN: [id number]  Chief Complaint  Patient presents with  . Follow-up    former patient Dr. Melvyn Novas- cough mostly at night , sometimes during they day blood streaks mixed with sputum , over exertion has wheezing and shortness of breath , smoker    This is a 54 year old female with a history of coccidiomycosis, [place] fever in [place] status post lobectomy, left upper lobe in 1997 years of antifungal therapy on fluconazole.  She has subsequently over the years developed bronchiectasis.  Unfortunately she is still an active smoker.  In November 2013 she had breast cancer status post lung neck lumpectomy with a positive node and finished with XRT and chemo in 2015.  In May 2019 had MRI concerning for right parietal brain metastasis.  The patient ultimately underwent resection of the frontal lobe metastasis.  The patient's current symptoms include cough and daily sputum production.  She is using her flutter valve.  Unfortunately she still is smoking.  She states that she is trying to quit but she has had much difficulty.  Patient denies fevers, chills, night sweats.  But she does have chronic sputum production that is yellow-green in color.  She was seen by Dr. Graylon Good at [facility] which is recommended bronchoscopy for sampling and cultures.  OV 04/20/2020: Patient here today for follow-up after recent bronchoscopy.  Patient has extensive medical history.  Still followed by medical oncology closely.  Recently established care with Dr. Drucilla Schmidt from infectious disease.  Breast cancer history with metastasis to the brain.  Also has significant lung disease, bronchiectasis and cavitary disease within the remaining portions of her left lung.  Status post left upper lobectomy in 1997.  Recent CT imaging of the chest revealed possible mycetoma within  the left superior segment cavity of the left lower lobe.  She underwent bronchoscopy with cultures.  Fungal cultures remain negative.  She did have 1 AFB that was positive, ID for TB and MAI negative.  Pending for the others.  She does have follow-up scheduled with infectious disease.  Unfortunately she is still smoking.  We talked about the importance of smoking cessation today in the office.  She does have hemoptysis, this is scant but does occur randomly throughout the week.   Past Medical History:  Diagnosis Date  . Anemia   . Arthritis    knees and hips  . 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[place]) 02/26/2020  . Asthma   . Breast cancer ([place])   . Bronchiectasis ([place])   . Bronchiolitis   . Cancer [facility])    breast cancer 2014  . Cancer, metastatic to liver ([place])    2021  . Complication of anesthesia    bp dropped + desat   . COPD (chronic obstructive pulmonary disease) ([place])   . Dyspnea    DOE  . GERD (gastroesophageal reflux disease)   . H/O coccidioidomycosis    was reason for lung lobectomy  . Headache(784.0)    due to eye strain or not eating  . History of anemia    no current problem  . History of asthma    as a child  . History of breast cancer 2014   left  . History of chemotherapy    finished 07/17/2013  . History of hiatal hernia    AGE 30  . Hx of radiation therapy 03/25/13-05/06/13   left breast 5000  cGy/25 sessions, left breast boost 1000 cGy/5 sessions  . 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 Pneumonia    LAST FLARE UP 01/2018  . Runny nose 07/30/2013   clear drainage  . Wears dentures    upper     Family History  Problem Relation Age of Onset  . Emphysema Mother        was a smoker  . Heart disease Mother   . Melanoma Mother        dx in her 64s  . Breast cancer Mother 41  . Asthma Brother   . Breast cancer Cousin        mother's maternal cousin; dx in her 26s     Past Surgical History:  Procedure Laterality Date  . APPLICATION OF CRANIAL NAVIGATION N/A 08/04/2017   Procedure:  APPLICATION OF CRANIAL NAVIGATION;  Surgeon: [name], [name], MD;  Location: [place];  Service: Neurosurgery;  Laterality: N/A;  . AXILLARY LYMPH NODE DISSECTION Left 02/08/2013   Procedure: LEFT AXILLARY DISSECTION;  Surgeon: Edward Jolly, MD;  Location: [place];  Service: General;  Laterality: Left;  . BREAST CYST EXCISION Right 2006  . BREAST LUMPECTOMY Left 2014  . BREAST LUMPECTOMY WITH NEEDLE LOCALIZATION AND AXILLARY SENTINEL LYMPH NODE BX Left 12/31/2012   Procedure: NEEDLE LOCALIZATION LEFT BREAST LUMPECTOMY AND LEFT AXILLARY SENTENIAL LYMPH NODE BX;  Surgeon: Edward Jolly, MD;  Location: [place];  Service: General;  Laterality: Left;  . BRONCHIAL BRUSHINGS  03/17/2020   Procedure: BRONCHIAL BRUSHINGS;  Surgeon: Garner Nash, DO;  Location: [place] [facility];  Service: Pulmonary;;  . BRONCHIAL WASHINGS  03/17/2020   Procedure: BRONCHIAL WASHINGS;  Surgeon: Garner Nash, DO;  Location: [place] [facility];  Service: Pulmonary;;  . CESAREAN SECTION  1995/1996  . CRANIOTOMY Right 08/04/2017   Procedure: Right Frontal craniotomy for resection of tumor with stereotactic navigation;  Surgeon: [name], [name], MD;  Location: [place];  Service: Neurosurgery;  Laterality: Right;  Right Frontal craniotomy for resection of tumor with stereotactic navigation  . IR IMAGING GUIDED PORT INSERTION  05/31/2019  . LUNG LOBECTOMY Left 05/1996   upper lobe - due to [facility] Fever  . PORT-A-CATH REMOVAL Right 08/02/2013   Procedure: REMOVAL PORT-A-CATH;  Surgeon: Edward Jolly, MD;  Location: [place];  Service: General;  Laterality: Right;  . PORTACATH PLACEMENT  07/02/2012   Procedure: INSERTION PORT-A-CATH;  Surgeon: Edward Jolly, MD;  Location: [place];  Service: General;  Laterality: N/A;  right  . VIDEO BRONCHOSCOPY N/A 03/17/2020   Procedure: VIDEO BRONCHOSCOPY;  Surgeon: Garner Nash, DO;  Location: [place]  [facility];  Service: Pulmonary;  Laterality: N/A;  . VIDEO BRONCHOSCOPY WITH ENDOBRONCHIAL ULTRASOUND N/A 05/23/2018   Procedure: VIDEO BRONCHOSCOPY WITH ENDOBRONCHIAL ULTRASOUND;  Surgeon: Garner Nash, DO;  [facility];  Service: Thoracic;  Laterality: N/A;    Social History   Socioeconomic History  . Marital status: Married    Spouse name: Not on file  . Number of children: 2  . Years of education: Not on file  . Highest education level: Not on file  Occupational History  . Occupation: Secondary school teacher  Tobacco Use  . Smoking status: Current Every Day Smoker    Packs/day: 1.00    Years: 38.00    Pack years: 38.00    Types: Cigarettes  . Smokeless tobacco: Never Used  . Tobacco comment: 04/20/20 less than 0.5ppd as of 03/05/20  Vaping Use  .  Vaping Use: Never used  Substance and Sexual Activity  . Alcohol use: No  . Drug use: No  . Sexual activity: Not Currently  Other Topics Concern  . Not on file  Social History Narrative  . Not on file   Social Determinants of Health   Financial Resource Strain:   . Difficulty of Paying Living Expenses: Not on file  Food Insecurity:   . Worried About Charity fundraiser in the Last Year: Not on file  . Ran Out of Food in the Last Year: Not on file  Transportation Needs:   . Lack of Transportation (Medical): Not on file  . Lack of Transportation (Non-Medical): Not on file  Physical Activity:   . Days of Exercise per Week: Not on file  . Minutes of Exercise per Session: Not on file  Stress:   . Feeling of Stress : Not on file  Social Connections:   . Frequency of Communication with Friends and Family: Not on file  . Frequency of Social Gatherings with Friends and Family: Not on file  . Attends Religious Services: Not on file  . Active Member of Clubs or Organizations: Not on file  . Attends Archivist Meetings: Not on file  . Marital Status: Not on file  Intimate Partner Violence:   . Fear of Current or  Ex-Partner: Not on file  . Emotionally Abused: Not on file  . Physically Abused: Not on file  . Sexually Abused: Not on file     Allergies  Allergen Reactions  . Aspirin Anaphylaxis and Shortness Of Breath    THROAT CLOSES  . Protonix [Pantoprazole] Nausea Only and Other (See Comments)    Also caused a "film in the mouth" and caused chest pressure  . Iodinated Diagnostic Agents Rash     Outpatient Medications Prior to Visit  Medication Sig Dispense Refill  . albuterol (VENTOLIN HFA) 108 (90 Base) MCG/ACT inhaler Inhale 2 puffs into the lungs every 6 (six) hours as needed. 8 g 5  . budesonide-formoterol (SYMBICORT) 160-4.5 MCG/ACT inhaler Inhale 2 puffs into the lungs 2 (two) times daily. 3 Inhaler 3  . Isavuconazonium Sulfate (CRESEMBA) 186 MG CAPS TAKE 2 CAPSULES BY MOUTH EVERY 24 HOURS 56 capsule 11  . levalbuterol (XOPENEX) 0.63 MG/3ML nebulizer solution Take 3 mLs (0.63 mg total) by nebulization every 4 (four) hours as needed for wheezing or shortness of breath. 540 mL 6  . lidocaine-prilocaine (EMLA) cream Apply to affected area once (Patient taking differently: Apply 1 application topically daily as needed (port access). ) 30 g 3  . naproxen sodium (ALEVE) 220 MG tablet Take 440 mg by mouth 2 (two) times daily as needed (headache).     Marland Kitchen omeprazole (PRILOSEC) 40 MG capsule Take 1 capsule (40 mg total) by mouth daily. 30 capsule 3  . chlorpheniramine-HYDROcodone (TUSSIONEX) 10-8 MG/5ML SUER Take 5 mLs by mouth at bedtime. (Patient not taking: Reported on 04/20/2020) 473 mL 0  . Melatonin 5 MG TABS Take 5 mg by mouth at bedtime as needed (sleep).  (Patient not taking: Reported on 04/02/2020)     No facility-administered medications prior to visit.    Review of Systems  Constitutional: Negative for chills, fever, malaise/fatigue and weight loss.  HENT: Negative for hearing loss, sore throat and tinnitus.   Eyes: Negative for blurred vision and double vision.  Respiratory: Positive  for cough, hemoptysis, sputum production, shortness of breath and wheezing. Negative for stridor.   Cardiovascular: Negative for chest  pain, palpitations, orthopnea, leg swelling and PND.  Gastrointestinal: Negative for abdominal pain, constipation, diarrhea, heartburn, nausea and vomiting.  Genitourinary: Negative for dysuria, hematuria and urgency.  Musculoskeletal: Negative for joint pain and myalgias.  Skin: Negative for itching and rash.  Neurological: Negative for dizziness, tingling, weakness and headaches.  Endo/Heme/Allergies: Negative for environmental allergies. Does not bruise/bleed easily.  Psychiatric/Behavioral: Negative for depression. The patient is not nervous/anxious and does not have insomnia.   All other systems reviewed and are negative.    Objective:  Physical Exam Vitals reviewed.  Constitutional:      General: She is not in acute distress.    Appearance: She is well-developed.  HENT:     Head: Normocephalic and atraumatic.     Mouth/Throat:     Pharynx: No oropharyngeal exudate.  Eyes:     Conjunctiva/sclera: Conjunctivae normal.     Pupils: Pupils are equal, round, and reactive to light.  Neck:     Vascular: No JVD.     Trachea: No tracheal deviation.     Comments: Loss of supraclavicular fat Cardiovascular:     Rate and Rhythm: Normal rate and regular rhythm.     Heart sounds: S1 normal and S2 normal.     Comments: Distant heart tones Pulmonary:     Effort: No tachypnea or accessory muscle usage.     Breath sounds: No stridor. Decreased breath sounds (throughout all lung fields), wheezing and rhonchi present. No rales.     Comments: Tubular breath sounds in the left Abdominal:     General: Bowel sounds are normal. There is no distension.     Palpations: Abdomen is soft.     Tenderness: There is no abdominal tenderness.  Musculoskeletal:        General: Deformity (muscle wasting ) present.  Skin:    General: Skin is warm and dry.     Capillary  Refill: Capillary refill takes less than 2 seconds.     Findings: No rash.  Neurological:     Mental Status: She is alert and oriented to person, place, and time.  Psychiatric:        Behavior: Behavior normal.      Vitals:   04/20/20 1000  BP: 98/62  Pulse: (!) 103  Temp: 98.5 F (36.9 C)  TempSrc: Temporal  SpO2: 95%  Weight: 119 lb 9.6 oz (54.3 kg)  Height: _0  (1.575 m)   95% on RA. BMI Readings from Last 3 Encounters:  04/20/20 21.88 kg/m  04/14/20 20.01 kg/m  04/02/20 20.49 kg/m   Wt Readings from Last 3 Encounters:  04/20/20 119 lb 9.6 oz (54.3 kg)  04/14/20 109 lb 6.4 oz (49.6 kg)  04/02/20 112 lb (50.8 kg)     CBC    Component Value Date/Time   WBC 7.9 04/14/2020 0854   RBC 3.28 (L) 04/14/2020 0854   HGB 9.7 (L) 04/14/2020 0854   HGB 11.8 (L) 02/11/2020 0836   HGB 13.1 08/12/2014 0955   HCT 29.7 (L) 04/14/2020 0854   HCT 40.4 08/12/2014 0955   PLT 249 04/14/2020 0854   PLT 303 02/11/2020 0836   PLT 228 08/12/2014 0955   MCV 90.5 04/14/2020 0854   MCV 98.2 08/12/2014 0955   MCH 29.6 04/14/2020 0854   MCHC 32.7 04/14/2020 0854   RDW 20.7 (H) 04/14/2020 0854   RDW 13.0 08/12/2014 0955   LYMPHSABS 1.9 04/14/2020 0854   LYMPHSABS 2.0 08/12/2014 0955   MONOABS 0.6 04/14/2020 0854   MONOABS  0.5 08/12/2014 0955   EOSABS 0.7 (H) 04/14/2020 0854   EOSABS 0.2 08/12/2014 0955   BASOSABS 0.1 04/14/2020 0854   BASOSABS 0.0 08/12/2014 0955    Chest Imaging: 04/26/2018: CT imaging Left upper lobe cavity, cystic bronchiectasis thick secretions. Subcarinal adenopathy The patient's images have been independently reviewed by me.    02/04/2020: CT chest: Significant cavitary lung disease within the remaining left lower lobe, possible mycetoma within the largest portion of the superior segment cavity with spiculated nodular opacities. The patient's images have been independently reviewed by me.    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  Recent bronchoscopy.  We reviewed all of the patient's recent bronchoscopy results to include culture results.  We also reviewed patient's images today in the office.  If there is still need for repeat cultures of the cavity.  We could consider biopsy of the cavitary wall.  There is a risk of bleeding associated with cavitary wall biopsy due to the increased vascularity that is associated with formations of cavities and aneurysmal dilatation of the pulmonary arteries.  Another option could be a percutaneous approach for biopsy of the cavitary wall.  Additional options could be to include a repeat bronchoscopy with BAL fluid sent for PCR for broad-spectrum evaluation.  This can be done at the [place] [facility] [place].  I discussed this with Dr. Tommy Medal from infectious disease.  Either way she is going to need follow-up images in approximately 6 months.  This will include follow-up images for her breast cancer history as well as  evaluation of the ongoing chronic cavitary disease within the lungs.  Today in the office we also discussed the importance of smoking cessation.  We discussed various methods of smoking cessation today in the office.  RTC in 6 months   I spent 48 minutes dedicated to the care of this patient on the date of this encounter to include pre-visit review of records, face-to-face time with the patient discussing conditions above, post visit ordering of testing, clinical documentation with the electronic health record, making appropriate referrals as documented, and communicating necessary findings to members of the patients care team.     Current Outpatient Medications:  .  albuterol (VENTOLIN HFA) 108 (90 Base) MCG/ACT inhaler, Inhale 2 puffs into the lungs every 6 (six) hours as needed., Disp: 8 g, Rfl: 5 .  budesonide-formoterol (SYMBICORT) 160-4.5 MCG/ACT inhaler, Inhale 2 puffs into the lungs  2 (two) times daily., Disp: 3 Inhaler, Rfl: 3 .  Isavuconazonium Sulfate (CRESEMBA) 186 MG CAPS, TAKE 2 CAPSULES BY MOUTH EVERY 24 HOURS, Disp: 56 capsule, Rfl: 11 .  levalbuterol (XOPENEX) 0.63 MG/3ML nebulizer solution, Take 3 mLs (0.63 mg total) by nebulization every 4 (four) hours as needed for wheezing or shortness of breath., Disp: 540 mL, Rfl: 6 .  lidocaine-prilocaine (EMLA) cream, Apply to affected area once (Patient taking differently: Apply 1 application topically daily as needed (port access). ), Disp: 30 g, Rfl: 3 .  naproxen sodium (ALEVE) 220 MG tablet, Take 440 mg by mouth 2 (two) times daily as needed (headache). , Disp: , Rfl:  .  omeprazole (PRILOSEC) 40 MG capsule, Take 1 capsule (40 mg total) by mouth daily., Disp: 30 capsule, Rfl: 3 .  chlorpheniramine-HYDROcodone (TUSSIONEX) 10-8 MG/5ML SUER, Take 5 mLs by mouth at bedtime. (Patient not taking: Reported on 04/20/2020), Disp: 473 mL, Rfl: 0   Garner Nash, DO [facility] 04/20/2020 10:11 AM

## 2020-04-19 NOTE — Patient Instructions (Addendum)
Thank you for visiting Dr. Valeta Harms at [facility]. Today we recommend the following:  Follow up with ID and oncology as scheduled  I will reach out to Dr. Lindi Adie to make sure planned CT imaging is scheduled for 6 months.  If we need additional tissue biopsy or cultures I can discuss further with Dr. Gildardo Griffes must quit smoking or vaping. This is the single most important thing that you can do to improve your lung health.   S = Set a quit date. T = Tell family, friends, and the people around you that you plan to quit. A = Anticipate or plan ahead for the tough times you'll face while quitting. R = Remove cigarettes and other tobacco products from your home, car, and work T = Talk to [name] about getting help to quit  If you need help feel free to reach out to our office, [place] [facility]oking Cessation Class: [phone], call 1-800-QUIT-NOW, or visit [email or web address]  Return in about 6 months (around 10/19/2020) for with APP or Dr. Valeta Harms.    Please do your part to reduce the spread of COVID-19.

## 2020-04-20 ENCOUNTER — Other Ambulatory Visit: Payer: Self-pay

## 2020-04-20 ENCOUNTER — Encounter: Payer: Self-pay | Admitting: Pulmonary Disease

## 2020-04-20 ENCOUNTER — Ambulatory Visit (INDEPENDENT_AMBULATORY_CARE_PROVIDER_SITE_OTHER): Payer: PRIVATE HEALTH INSURANCE | Admitting: Pulmonary Disease

## 2020-04-20 VITALS — BP 98/62 | HR 103 | Temp 98.5°F | Ht 62.0 in | Wt 119.6 lb

## 2020-04-20 DIAGNOSIS — Z8619 Personal history of other infectious and parasitic diseases: Secondary | ICD-10-CM

## 2020-04-20 DIAGNOSIS — C50912 Malignant neoplasm of unspecified site of left female breast: Secondary | ICD-10-CM | POA: Diagnosis not present

## 2020-04-20 DIAGNOSIS — F17210 Nicotine dependence, cigarettes, uncomplicated: Secondary | ICD-10-CM

## 2020-04-20 DIAGNOSIS — R590 Localized enlarged lymph nodes: Secondary | ICD-10-CM

## 2020-04-20 DIAGNOSIS — R899 Unspecified abnormal finding in specimens from other organs, systems and tissues: Secondary | ICD-10-CM

## 2020-04-20 DIAGNOSIS — Z902 Acquired absence of lung [part of]: Secondary | ICD-10-CM | POA: Diagnosis not present

## 2020-04-20 DIAGNOSIS — J984 Other disorders of lung: Secondary | ICD-10-CM

## 2020-04-20 DIAGNOSIS — C7931 Secondary malignant neoplasm of brain: Secondary | ICD-10-CM

## 2020-04-20 DIAGNOSIS — J479 Bronchiectasis, uncomplicated: Secondary | ICD-10-CM

## 2020-04-20 DIAGNOSIS — R042 Hemoptysis: Secondary | ICD-10-CM

## 2020-04-20 DIAGNOSIS — Z23 Encounter for immunization: Secondary | ICD-10-CM | POA: Diagnosis not present

## 2020-04-20 DIAGNOSIS — B479 Mycetoma, unspecified: Secondary | ICD-10-CM

## 2020-04-21 LAB — FUNGAL ORGANISM REFLEX

## 2020-04-21 LAB — FUNGUS CULTURE RESULT

## 2020-04-21 LAB — FUNGUS CULTURE WITH STAIN

## 2020-04-27 ENCOUNTER — Telehealth: Payer: Self-pay

## 2020-04-27 NOTE — Telephone Encounter (Signed)
I believe Butch Penny is working on getting it approved through her insurance. I will follow up with her tomorrow.

## 2020-04-27 NOTE — Telephone Encounter (Signed)
Patient reports she is no longer eligible for [employer] assistance program and would prefer an alternative is available. Routing message to Dr. Tommy Medal and CPhT to make aware.  [name]

## 2020-04-27 NOTE — Telephone Encounter (Signed)
Cassie do you want to move to Voriconazole. She has an + AFB organism growing on culture now btw from BAL

## 2020-04-28 ENCOUNTER — Telehealth: Payer: Self-pay

## 2020-04-28 NOTE — Telephone Encounter (Signed)
Her PA for Melissa [name] was denied due to needing to try voriconazole first. We ran it through her insurance and it went through just fine. Do you want to proceed with that?

## 2020-04-28 NOTE — Telephone Encounter (Signed)
[facility] Patient Advocate Encounter  Received notification from [place]  that the request for prior authorization for Melissa [name] has been denied due to pt need to try Voriconazole first.      This encounter will continue to be updated until final determination.    Ileene Patrick, [facility] Patient [facility] Phone: [phone] Fax:  [phone]

## 2020-04-28 NOTE — Telephone Encounter (Signed)
Yes please thanks Cassie!

## 2020-04-29 ENCOUNTER — Other Ambulatory Visit: Payer: Self-pay | Admitting: Pharmacist

## 2020-04-29 ENCOUNTER — Encounter: Payer: Self-pay | Admitting: Pharmacist

## 2020-04-29 DIAGNOSIS — B449 Aspergillosis, unspecified: Secondary | ICD-10-CM

## 2020-04-29 MED ORDER — VORICONAZOLE 200 MG PO TABS: 200.0000 mg | ORAL_TABLET | Freq: Two times a day (BID) | ORAL | 11 refills | Status: DC

## 2020-05-01 ENCOUNTER — Ambulatory Visit (HOSPITAL_COMMUNITY)
Admission: RE | Admit: 2020-05-01 | Discharge: 2020-05-01 | Disposition: A | Discharge status: Home / Self Care | Payer: PRIVATE HEALTH INSURANCE | Source: Ambulatory Visit | Attending: Radiation Oncology | Admitting: Radiation Oncology

## 2020-05-01 ENCOUNTER — Other Ambulatory Visit: Payer: Self-pay

## 2020-05-01 DIAGNOSIS — C7931 Secondary malignant neoplasm of brain: Secondary | ICD-10-CM | POA: Insufficient documentation

## 2020-05-01 DIAGNOSIS — C7949 Secondary malignant neoplasm of other parts of nervous system: Secondary | ICD-10-CM | POA: Insufficient documentation

## 2020-05-01 LAB — ACID FAST CULTURE WITH REFLEXED SENSITIVITIES (MYCOBACTERIA): Acid Fast Culture: NEGATIVE

## 2020-05-01 MED ORDER — HEPARIN SOD (PORK) LOCK FLUSH 100 UNIT/ML IV SOLN
500.0000 [IU] | INTRAVENOUS | Status: AC | PRN
  Administered 2020-05-01: 500 [IU]
  Filled 2020-05-01: qty 5

## 2020-05-01 MED ORDER — GADOBUTROL 1 MMOL/ML IV SOLN
5.5000 mL | Freq: Once | INTRAVENOUS | Status: AC | PRN
  Administered 2020-05-01: 5.5 mL via INTRAVENOUS

## 2020-05-04 LAB — ACID FAST CULTURE WITH REFLEXED SENSITIVITIES (MYCOBACTERIA): Acid Fast Culture: NEGATIVE

## 2020-05-04 NOTE — Progress Notes (Signed)
Patient Care Team: Patient, [name] as PCP - General (General Practice) [name], MD as Referring Physician (Emergency Medicine)  DIAGNOSIS:    ICD-10-CM   1. Metastasis to brain [facility])  C79.31   2. 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 Malignant neoplasm of upper-inner quadrant of left breast in female, estrogen receptor negative ([place])  C50.212    Z17.1     SUMMARY OF ONCOLOGIC HISTORY: Oncology History  Breast cancer of upper-inner quadrant of left female breast ([place])  06/08/2012 Initial Diagnosis   invasive ductal carcinoma that was ER positive PR positive HER-2/neu positive measuring 3.1 cm by MRI criteria. Ki-67 was 70% HER-2 was amplified with a ratio 2.91   07/12/2012 - 07/17/2013 Neo-Adjuvant Chemotherapy   TCH 6 followed by Herceptin maintenance   12/11/2012 Surgery   Left breast lumpectomy: 1.8 cm tumor 1 positive sentinel node, axillary lymph node dissection 02/08/2013 showed 0/13 lymph nodes   03/25/2013 - 05/06/2013 Radiation Therapy   Adjuvant radiation therapy   06/05/2013 - 07/20/2017 Anti-estrogen oral therapy   Tamoxifen 20 mg daily   07/27/2017 Relapse/Recurrence   MRI Brain: 3.4 x 2.9 x 2.9 cm RIGHT frontal lobe mass with imaging characteristics of solitary metastasis. Extensive vasogenic edema resulting in 9 mm RIGHT to LEFT midline shift. Equivocal very early LEFT ventricle entrapment.    08/04/2017 Surgery   Rt frontal brain resection: Poorly differentiated tumor IHC suggests breast primary ER and PR Positive   08/25/2017 - 09/04/2017 Radiation Therapy   Stereotactic radiation   09/18/2017 -  Anti-estrogen oral therapy   Lapatinib with letrozole   12/18/2017 - 12/19/2017 Radiation Therapy   New right parietal lobe metastases status post [facility]   05/09/2019 Relapse/Recurrence   Interval increase in size of the enhancing nodular left internal mammary soft tissue 2.8 cm.  Redemonstrated enlarged supraclavicular, lower  cervical and lower posterior cervical nodes unchanged.  Interval increase in the bony erosion of the posterior and lateral left third rib, increasing soft tissue lesion eroding the left sternal body 3.1 cm was 2.5 cm.  Bronchiectatic changes   06/19/2019 -  Chemotherapy   The patient had ado-trastuzumab emtansine (KADCYLA) 180 mg in sodium chloride 0.9 % 250 mL chemo infusion, 3.6 mg/kg = 180 mg, Intravenous, Once, 15 of 20 cycles Administration: 180 mg (06/19/2019), 180 mg (07/10/2019), 180 mg (09/17/2019), 180 mg (08/05/2019), 180 mg (08/27/2019), 180 mg (10/08/2019), 180 mg (10/29/2019), 180 mg (11/19/2019), 200 mg (12/10/2019), 200 mg (12/31/2019), 200 mg (01/20/2020), 200 mg (02/11/2020), 200 mg (03/03/2020), 200 mg (03/24/2020), 200 mg (04/14/2020)  for chemotherapy treatment.    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      CHIEF COMPLIANT: Follow-up of metastatic breast cancer  INTERVAL HISTORY: Melissa [name] is a 54 y.o. with above-mentioned history of metastatic breast cancer with brain metastasiss/presection who is currently  onKadcyla every 3 weeks. Brain MRI on 05/01/20 showed three new/increased small cerebellar and bilateral occipital metastases, increased enhancement at the site of previously treated right frontal lesions, and punctate left temporal enhancement favored to represent  metastasis.She presents to the clinic todayfortreatment.   ALLERGIES:  is allergic to aspirin, protonix [pantoprazole], and iodinated diagnostic agents.  MEDICATIONS:  Current Outpatient Medications  Medication Sig Dispense Refill  . albuterol (VENTOLIN HFA) 108 (90 Base) MCG/ACT inhaler Inhale 2 puffs into the lungs every 6 (six) hours as needed. 8 g 5  . budesonide-formoterol (SYMBICORT) 160-4.5 MCG/ACT inhaler Inhale 2 puffs into the lungs 2 (two) times daily. 3 Inhaler 3  . chlorpheniramine-HYDROcodone (TUSSIONEX) 10-8 MG/5ML SUER Take 5 mLs by mouth at bedtime. (Patient not taking: Reported on 04/20/2020) 473 mL 0  . levalbuterol (XOPENEX) 0.63 MG/3ML nebulizer solution Take 3 mLs (0.63 mg total) by nebulization every 4 (four) hours as needed for wheezing or shortness of breath. 540 mL 6  . lidocaine-prilocaine (EMLA) cream Apply to affected area once (Patient taking differently: Apply 1 application topically daily as needed (port access). ) 30 g 3  . naproxen sodium (ALEVE) 220 MG tablet Take 440 mg by mouth 2 (two) times daily as needed (headache).     Marland Kitchen omeprazole (PRILOSEC) 40 MG capsule Take 1 capsule (40 mg total) by mouth daily. 30 capsule 3  . voriconazole (VFEND) 200 MG tablet Take 1 tablet (200 mg total) by mouth 2 (two) times daily. 60 tablet 11   No current facility-administered medications for this visit.    PHYSICAL EXAMINATION: ECOG PERFORMANCE STATUS: 1 - Symptomatic but completely ambulatory  Vitals:   05/05/20 0934  BP: 109/67  Pulse: 73  Resp: 18  Temp: (!) 97.3 F (36.3 C)  SpO2: 99%   Filed Weights   05/05/20 0934  Weight: 110 lb 4.8 oz (50 kg)     LABORATORY DATA:  I have reviewed the data as listed CMP Latest Ref Rng & Units 04/14/2020 03/24/2020 03/03/2020  Glucose 70 - 99 mg/dL 84 89 77  BUN 6 - 20 mg/dL $Remove'8 8 9  'FBWPbOJ$ Creatinine 0.44 - 1.00 mg/dL 0.84 0.78 0.86  Sodium 135 - 145 mmol/L 138 134(L) 137  Potassium 3.5 - 5.1 mmol/L 3.8 3.9 3.9  Chloride  98 - 111 mmol/L 102 100 102  CO2 22 - 32 mmol/L $RemoveB'28 28 27  'EnzDGhat$ Calcium 8.9 - 10.3 mg/dL 9.9 9.7 10.4(H)  Total Protein 6.5 - 8.1 g/dL 8.5(H) 8.4(H) 8.1  Total Bilirubin 0.3 - 1.2 mg/dL <0.2(L) 0.2(L) <0.2(L)  Alkaline Phos 38 - 126 U/L 146(H) 171(H) 142(H)  AST 15 - 41 U/L $Remo'25 27 18  'SzmYN$ ALT 0 - 44 U/L $Remo'18 18 10    'ktgvT$ Lab Results  Component Value Date   WBC 7.2 05/05/2020   HGB 8.8 (L) 05/05/2020   HCT 27.4 (L) 05/05/2020   MCV 92.9 05/05/2020   PLT 231 05/05/2020   NEUTROABS 4.1 05/05/2020    ASSESSMENT & PLAN:  Breast cancer of upper-inner quadrant of left female breast (H[facility]) 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 Anti Her 2 therapy with Lapatinibstarted 09/17/2017-05/14/2019: Stopped for progression 3.I  discontinuedtamoxifen and started her on letrozole 2.5 mg daily.05/14/2019 stopped for progression 4.Stereotactic radiosurgery 12/19/2017 to the new right parietal lobe metastases. -------------------------------------------------------------------------------------------------------------------- Liver Biopsy 06/05/19: Metastatic cancer, ER/PR: 0%, Her 2: 3+ Positive, Ki 67: 20% Patient had metastases to liver, bone, brain, and questionably lung  Current treatment: Kadcyla every 3 weeks, today is cycle16 Kadcyla toxicities: 1.Occasional diarrhea 2.mild peripheral neuropathy 3.Normocytic anemia: Monitoring. Received blood transfusion in July 2021.Today's hemoglobin is 10.1.  Bone metastases: Because of dental issues bisphosphonates were  not started  New brain metastases: Brain MRI 05/01/2020: Bilateral occipital, left temporal and cerebellar mets Based on these new brain metastases, I recommend switching her to Tucatinib.  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 She will not receive any further Kadcyla after today's treatment.    No orders of the defined types were placed in this encounter.  The patient has a good understanding of the overall plan. she agrees with it. she will call with any problems that may develop before the next visit here.  Total time spent: 30 mins including face to face time and time spent for planning, charting and coordination of care  Nicholas Lose, MD 05/05/2020  I, Cloyde Reams Dorshimer, am acting as scribe for Dr. Nicholas Lose.  I have reviewed the above documentation for accuracy and completeness, and I agree with the above.

## 2020-05-05 ENCOUNTER — Inpatient Hospital Stay: Payer: PRIVATE HEALTH INSURANCE

## 2020-05-05 ENCOUNTER — Other Ambulatory Visit: Payer: Self-pay | Admitting: Hematology and Oncology

## 2020-05-05 ENCOUNTER — Telehealth: Payer: Self-pay | Admitting: Pharmacist

## 2020-05-05 ENCOUNTER — Other Ambulatory Visit: Payer: Self-pay

## 2020-05-05 ENCOUNTER — Telehealth: Payer: Self-pay

## 2020-05-05 ENCOUNTER — Inpatient Hospital Stay (HOSPITAL_BASED_OUTPATIENT_CLINIC_OR_DEPARTMENT_OTHER): Payer: PRIVATE HEALTH INSURANCE | Admitting: Hematology and Oncology

## 2020-05-05 VITALS — BP 109/67 | HR 73 | Temp 97.3°F | Resp 18 | Ht 62.0 in | Wt 110.3 lb

## 2020-05-05 DIAGNOSIS — C50212 Malignant neoplasm of upper-inner quadrant of left female breast: Secondary | ICD-10-CM

## 2020-05-05 DIAGNOSIS — Z171 Estrogen receptor negative status [ER-]: Secondary | ICD-10-CM

## 2020-05-05 DIAGNOSIS — C7931 Secondary malignant neoplasm of brain: Secondary | ICD-10-CM

## 2020-05-05 DIAGNOSIS — C50912 Malignant neoplasm of unspecified site of left female breast: Secondary | ICD-10-CM | POA: Diagnosis not present

## 2020-05-05 DIAGNOSIS — Z95828 Presence of other vascular implants and grafts: Secondary | ICD-10-CM

## 2020-05-05 DIAGNOSIS — Z5112 Encounter for antineoplastic immunotherapy: Secondary | ICD-10-CM | POA: Diagnosis not present

## 2020-05-05 LAB — CBC WITH DIFFERENTIAL/PLATELET
Abs Immature Granulocytes: 0.02 10*3/uL (ref 0.00–0.07)
Basophils Absolute: 0.1 10*3/uL (ref 0.0–0.1)
Basophils Relative: 1 %
Eosinophils Absolute: 0.6 10*3/uL — ABNORMAL HIGH (ref 0.0–0.5)
Eosinophils Relative: 8 %
HCT: 27.4 % — ABNORMAL LOW (ref 36.0–46.0)
Hemoglobin: 8.8 g/dL — ABNORMAL LOW (ref 12.0–15.0)
Immature Granulocytes: 0 %
Lymphocytes Relative: 24 %
Lymphs Abs: 1.7 10*3/uL (ref 0.7–4.0)
MCH: 29.8 pg (ref 26.0–34.0)
MCHC: 32.1 g/dL (ref 30.0–36.0)
MCV: 92.9 fL (ref 80.0–100.0)
Monocytes Absolute: 0.8 10*3/uL (ref 0.1–1.0)
Monocytes Relative: 11 %
Neutro Abs: 4.1 10*3/uL (ref 1.7–7.7)
Neutrophils Relative %: 56 %
Platelets: 231 10*3/uL (ref 150–400)
RBC: 2.95 MIL/uL — ABNORMAL LOW (ref 3.87–5.11)
RDW: 18.3 % — ABNORMAL HIGH (ref 11.5–15.5)
WBC: 7.2 10*3/uL (ref 4.0–10.5)
nRBC: 0 % (ref 0.0–0.2)

## 2020-05-05 LAB — COMPREHENSIVE METABOLIC PANEL
ALT: 14 U/L (ref 0–44)
AST: 22 U/L (ref 15–41)
Albumin: 3 g/dL — ABNORMAL LOW (ref 3.5–5.0)
Alkaline Phosphatase: 139 U/L — ABNORMAL HIGH (ref 38–126)
Anion gap: 7 (ref 5–15)
BUN: 9 mg/dL (ref 6–20)
CO2: 26 mmol/L (ref 22–32)
Calcium: 9.7 mg/dL (ref 8.9–10.3)
Chloride: 104 mmol/L (ref 98–111)
Creatinine, Ser: 0.87 mg/dL (ref 0.44–1.00)
GFR, Estimated: >60 mL/min (ref >60)
Glucose, Bld: 103 mg/dL — ABNORMAL HIGH (ref 70–99)
Potassium: 3.6 mmol/L (ref 3.5–5.1)
Sodium: 137 mmol/L (ref 135–145)
Total Bilirubin: <0.2 mg/dL — ABNORMAL LOW (ref 0.3–1.2)
Total Protein: 8.4 g/dL — ABNORMAL HIGH (ref 6.5–8.1)

## 2020-05-05 MED ORDER — SODIUM CHLORIDE 0.9% FLUSH
10.0000 mL | INTRAVENOUS | Status: DC | PRN
  Administered 2020-05-05: 10 mL
  Filled 2020-05-05: qty 10

## 2020-05-05 MED ORDER — SODIUM CHLORIDE 0.9 % IV SOLN
200.0000 mg | Freq: Once | INTRAVENOUS | Status: AC
  Administered 2020-05-05: 200 mg via INTRAVENOUS
  Filled 2020-05-05: qty 10

## 2020-05-05 MED ORDER — ACETAMINOPHEN 325 MG PO TABS
650.0000 mg | ORAL_TABLET | Freq: Once | ORAL | Status: AC
  Administered 2020-05-05: 650 mg via ORAL

## 2020-05-05 MED ORDER — ACETAMINOPHEN 325 MG PO TABS
ORAL_TABLET | ORAL | Status: AC
  Filled 2020-05-05: qty 2

## 2020-05-05 MED ORDER — TUCATINIB 150 MG PO TABS: 300.0000 mg | ORAL_TABLET | Freq: Two times a day (BID) | ORAL | 3 refills | Status: DC

## 2020-05-05 MED ORDER — HEPARIN SOD (PORK) LOCK FLUSH 100 UNIT/ML IV SOLN
500.0000 [IU] | Freq: Once | INTRAVENOUS | Status: AC | PRN
  Administered 2020-05-05: 500 [IU]
  Filled 2020-05-05: qty 5

## 2020-05-05 MED ORDER — DIPHENHYDRAMINE HCL 25 MG PO CAPS
25.0000 mg | ORAL_CAPSULE | Freq: Once | ORAL | Status: AC
  Administered 2020-05-05: 25 mg via ORAL

## 2020-05-05 MED ORDER — DIPHENHYDRAMINE HCL 25 MG PO CAPS
ORAL_CAPSULE | ORAL | Status: AC
  Filled 2020-05-05: qty 1

## 2020-05-05 MED ORDER — SODIUM CHLORIDE 0.9 % IV SOLN
Freq: Once | INTRAVENOUS | Status: DC
  Filled 2020-05-05: qty 250

## 2020-05-05 NOTE — Assessment & Plan Note (Signed)
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 Anti Her 2 therapy with Lapatinibstarted 09/17/2017-05/14/2019: Stopped for progression 3.I discontinuedtamoxifen and started her on letrozole 2.5 mg daily.05/14/2019 stopped for progression 4.Stereotactic radiosurgery 12/19/2017 to the new right parietal lobe metastases. -------------------------------------------------------------------------------------------------------------------- Liver Biopsy 06/05/19: Metastatic cancer, ER/PR: 0%, Her 2: 3+ Positive, Ki 67: 20% Patient has metastases to liver, bone, brain, and questionably lung  Current treatment: Kadcyla every 3 weeks, today is cycle16 Kadcyla toxicities: 1.Occasional diarrhea 2.mild peripheral neuropathy 3.Normocytic anemia: Monitoring. Received blood transfusion in July 2021.Today's hemoglobin is 10.1.  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

## 2020-05-05 NOTE — Telephone Encounter (Signed)
Oral Oncology Pharmacist Encounter  Received new prescription for Tukysa (tucatinib) for the treatment of metastatic ER/PR positive, HER-2 positive breast cancer, planned duration until disease progression or unacceptable drug toxicity.  Prescription dose and frequency assessed for appropriateness. Appropriate for therapy initiation.   CMP and CBC w/ Diff from 05/05/20 assessed, labs stable for treatment initiation.  Current medication list in Epic reviewed, DDIs with Tukysa (tucatinib) identified:  Category D DDI between Symbicort and Tukysa - Tukysa, a strong CYP3A4 inhibitor may increase serum concentrations of Symbicort - recommend monitoring for increased ADEs from Symbicort   Category C DDI between Voriconazole and Tukysa - Tukysa may increase serum concentrations of voriconazole through CYP3A4 inhibition, thus increase risk of side effects from voriconazole. Will notify patient's ID team that is managing her voriconazole that she will be starting [facility]  Evaluated chart and no patient barriers to medication adherence noted.   Prescription has been e-scribed to the [facility] for benefits analysis and approval.  Oral Oncology Clinic will continue to follow for insurance authorization, copayment issues, initial counseling and start date.  Leron Croak, PharmD, BCPS Hematology/Oncology Clinical Pharmacist [facility] [phone] 05/05/2020 1:12 PM

## 2020-05-05 NOTE — Telephone Encounter (Signed)
Oral Oncology Patient Advocate Encounter  Received notification from [employer] that prior authorization for Laury Axon is required.  PA submitted on CoverMyMeds Key [id number] Status is pending  Oral Oncology Clinic will continue to follow.  [place] Patient [place] Phone [phone] Fax [phone] 05/05/2020 10:53 AM

## 2020-05-06 ENCOUNTER — Telehealth: Payer: Self-pay | Admitting: Radiation Therapy

## 2020-05-06 ENCOUNTER — Telehealth: Payer: Self-pay | Admitting: Pharmacist

## 2020-05-06 NOTE — Telephone Encounter (Signed)
[name] met with Dr. Gudena yesterday to review her recent brain MRI results and to discuss future treatment options. Dr. Manning is offering salvage SRS to treat the four new targets seen on her brain MRI. I called her this morning to  discussed salvage SRS Mrs. Melissa [name] is in agreement and coming in for simulation on Thursday 10/28. The follow-up with Melissa [name] has been cancelled and I am working on getting her treatment set up with Dr. Stern.  ° °Melissa [name] R.T.(R)(T) °Radiation Special Procedures Navigator  °

## 2020-05-06 NOTE — Telephone Encounter (Signed)
Thanks Cassie what a mess

## 2020-05-06 NOTE — Telephone Encounter (Signed)
Oral Oncology Pharmacist Encounter   Prior Authorization for Laury Axon (tucatinib) has been denied.     Will proceed with appeal process at this time.     Oral Oncology Clinic will continue to follow.    Leron Croak, PharmD, BCPS Hematology/Oncology Clinical Pharmacist [facility] [phone] 05/06/2020 11:30 AM

## 2020-05-06 NOTE — Progress Notes (Signed)
Has armband been applied?  Yes.    Does patient have an allergy to IV contrast dye?: No.   Has patient ever received premedication for IV contrast dye?: No.   Does patient take metformin?: No.  If patient does take metformin when was the last dose: n/a  Date of lab work: 05/05/2020 BUN: 9 CR: 0.87  IV site: subclavian right, condition no redness  Has IV site been added to flowsheet?  Yes.

## 2020-05-06 NOTE — Telephone Encounter (Signed)
I was messaged by patient's hematology/oncology clinical pharmacist that unfortunately patient has disease progression of her metastatic breast cancer with brain metastases. Patient will be started on Tukysa (tucatinib) which has a drug-drug interaction with her newly started voriconazole (previously on [place]) for aspergillosis.   Drug interaction category C: monitor for increased voriconazole concentrations and toxicities including prolonged QTc, etc. Will need to monitor her closely on these two medications. Not many other options due to her insurance requiring voriconazole to be used first. If she has issues with voriconazole or intolerances/toxicities, we can submit an appeal to her insurance to try for Cresemba again.

## 2020-05-07 ENCOUNTER — Ambulatory Visit
Admission: RE | Admit: 2020-05-07 | Discharge: 2020-05-07 | Disposition: A | Discharge status: Home / Self Care | Payer: PRIVATE HEALTH INSURANCE | Source: Ambulatory Visit | Attending: Radiation Oncology | Admitting: Radiation Oncology

## 2020-05-07 ENCOUNTER — Telehealth: Payer: Self-pay | Admitting: Hematology and Oncology

## 2020-05-07 ENCOUNTER — Encounter: Payer: Self-pay | Admitting: Urology

## 2020-05-07 ENCOUNTER — Ambulatory Visit: Payer: PRIVATE HEALTH INSURANCE | Admitting: Urology

## 2020-05-07 ENCOUNTER — Other Ambulatory Visit: Payer: Self-pay

## 2020-05-07 VITALS — BP 111/61 | HR 70 | Temp 97.9°F | Resp 18 | Ht 62.0 in | Wt 110.2 lb

## 2020-05-07 DIAGNOSIS — Z170 Estrogen receptor positive status [ER+]: Secondary | ICD-10-CM | POA: Insufficient documentation

## 2020-05-07 DIAGNOSIS — C7931 Secondary malignant neoplasm of brain: Secondary | ICD-10-CM | POA: Insufficient documentation

## 2020-05-07 DIAGNOSIS — C50212 Malignant neoplasm of upper-inner quadrant of left female breast: Secondary | ICD-10-CM | POA: Diagnosis not present

## 2020-05-07 MED ORDER — SODIUM CHLORIDE 0.9% FLUSH
10.0000 mL | INTRAVENOUS | Status: AC
  Administered 2020-05-07: 10 mL via INTRAVENOUS

## 2020-05-07 MED ORDER — HEPARIN SOD (PORK) LOCK FLUSH 100 UNIT/ML IV SOLN
500.0000 [IU] | Freq: Once | INTRAVENOUS | Status: AC
  Administered 2020-05-07: 500 [IU] via INTRAVENOUS

## 2020-05-07 NOTE — Progress Notes (Signed)
Radiation Oncology         [phone] ________________________________  Name: Melissa [name] MRN: [id number]  Date: 05/07/2020  DOB: [birth year]  Post Treatment Note  CC: Patient, [name]  No ref. provider found  Diagnosis:   54 y.o. 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  Interval Since Last Radiation:  10 months 03/25/2019:  SRS brain//PTV 5:  Right frontal 5 mm target was treated to a prescription dose of 20 Gy in a single fraction.   09/04/2018:   SRS Brain// Right Frontal, 2 targets / 20 Gy in 1 fraction PTV3: Ant Rt Frontal 46mm 20Gy PTV4: Rt Frontal resection cavity 56mm  20Gy  12/18/2017: SRS brain//PTV2: 4 mm Rt Parietal lesion treated to 20 Gy in 1 Fx  08/25/2017, 08/28/2017, 08/30/2017, 09/01/2017, 09/04/2017: PTV1: post op SRS to right frontal lobe resection cavity in 5 fxs  Narrative:  In summary, she initially presented to the emergency Department on 07/27/2017 with complaints of headaches ongoing for approximately 5 weeks with associated nausea and vomiting. She was initially treated by her PCP for sinusitis without improvement.  She also has a history of migraine headaches and had ben evaluated in the ED at [facility] on 06/20/2017 for treatment of what she thought was a typical migraine headache given the fact that she had her usual blurry vision and aura preceding the headache. She reports that the headache did improve with treatment but the relief was short-lived as the headache returned the very next day. She followed up with her primary care physician and a CT of the head was going to be scheduled due to the persistent headaches but in the interim, she presented to the Emergency Department at [facility] due to increased severity of the headache with associated nausea and vomiting. The headaches were occuring in the frontal lobe as well as bilateral temporal with occasional radiation to the  occipital lobe and associated with blurry vision, decreased appetite, fatigue, nausea and vomiting.  She denied any difficulty with speech, memory, imbalance or focal weakness.  CT Head on admission 07/27/2017 showed a large right frontal lobe mass measuring 3.1 by 2.5 cm with significant surrounding edema with mass effect and right to left midline shift measuring 8 mm. A subsequent Brain MRI showed a 3.4 x 2.9 x 2.9 cm right frontal lobe mass with imaging characteristics of solitary metastasis and extensive vasogenic edema resulting in 9 mm right to left midline shift as well as equivocal very early left ventricle entrapment.   CT Chest, Abdomen, Pelvis on 07/28/2017 for disease staging showed no findings to suggest metastatic breast cancer involving the chest, abdomen, pelvis or osseous structures. 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 Surgical changes involving the left breast and left axilla but no findings for local recurrence or regional adenopathy.  She proceeded with a gross total resection of the frontal lesion on 08/05/17 with Dr. Cyndy Freeze followed by adjuvant fractionated postop SRS radiotherapy to the resection cavity in February 2019. Final surgical pathology revealed poorly differentiated adenocarcinoma consistent with metastatic breast cancer, ER/PR positive and HER-2 positive.   She tolerated SRS treatment well and initial post treatment MRI brain on 12/07/17 showed satisfactory appearance of the right anterior frontal lobe post treatment site with expected evolution but unfortunately also showed a single, new 2 - 3 mm lesion in the right parietal lobe without associated edema or mass-effect.  She elected to undergo salvage SRS treatment to the new lesion in the parietal lobe which was completed on 12/18/17 and tolerated well.  A follow up brain MRI on 05/18/18 showed a stable right frontal resection cavity with stable 4 mm nodular  enhancement at the inferior cavity margin but no evidence of new disease.  Unfortunately, the follow up MRI brain scan on 08/23/2018 showed a new 11 mm inferior right frontal lesion with only mild associated edema and no mass-effect.  She reported that she had been having more frequent frontal headaches which were not severe and responded to Aleve and otherwise, had been feeling well.  She elected to proceed with a single fraction of SRS to this new brain metastasis as recommended and this was completed on  09/04/18 and tolerated very well.   She had bronchoscopy with Dr. Valeta Harms in 05/2018 and dx'ed with Aspergillus- ID following (Dr. Graylon Good).  She also continues in routine follow up with Dr. Melvyn Novas in Pulmonology.  Follow up MRI brain scan from 03/08/19 showed continued interval enlargement of the nodular focus with enhancement at the superior margin of the right frontal resection cavity, measuring 5 x 6 mm in diameter compared with 3 x 4.5 mm on the previous study. Two smaller foci of enhancement along the inferior margin were unchanged and there was no evidence of increasing mass effect or edema and no new lesions seen elsewhere within the brain.  She elected to proceed with SRS treatment of the progressive nodular lesion which was completed on 03/25/19 and tolerated well without any acute ill effects.    She developed systemic disease progression, particularly in the chest with increase in size of enhancing nodular left internal mammary soft tissue mass, lymph nodes and left third rib lesion and soft tissue lesion eroding the left sternal body as well as a new, hypermetabolic 6.5 cm liver lesion seen on PET scan from 05/23/2019 despite continuing onlapatinibwith Letrozole. She did have an ultrasound-guided biopsy of the liver mass on 06/05/2019 which confirmed metastatic breast cancer, HER-2 positive, ER/PR negative. Her systemic therapy was changed to Wylie beginning on 06/19/2019, under the care and  direction of Dr. Lindi Adie which she tolerated very well.  A follow-up CT chest from 07/19/2019 showed a positive response to treatment with decrease in size of the soft tissue mass in the breast as well as bony lesions and adenopathy.  Fortunately, her follow-up MRI brain from 08/02/2019 showed stability of the previously treated disease and no new or progressive findings. A repeat MRI brain scan 11/04/19 was felt to be overall stable but there was a slight change in a previously treated right inferior frontal lesion, measuring 5 mm, previously 4 mm, with a new, separate punctate focus of enhancement just posterior to that. This was such minimal change and when her prior treatment fields were fused with recent scan, it appeared that the new area of enhancement was on the field border of GTV3 and would have received a large portion of the delivered dose.  Therefore, consensus recommendation was to simply monitor this area with routine 3 month follow up brain imaging. She had repeat MRI on 01/30/20 and this showed overall disease stability with an unchanged appearance of the 2 previously treated right frontal lobe deposits and no new or progressive disease. Repeat systemic disease staging scans were performed on 01/29/20 and 02/04/20.  The bone scan on 01/29/20 showed less uptake of tracer at the previously identified sternal and LEFT third rib lesions, consistent with response  to therapy and there were no new sites of osseous metastasis noted. The CT C/A/P performed on 02/04/20 also showed disease stability with a new cluster of spiculated nodule or opacities in the posterior left lung favoring aspergillosis or other atypical infectious etiology.  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  There was no definite evidence of metastatic disease within the abdomen or pelvis.  She continued on Kadcycla, s/p 14 cycles as of 05/05/20.   Unfortunately, her most recent MRI brain from 05/01/20 was reviewed at recent brain tumor conference and showed 4 new metastatic lesions, all subcentimeter with a 57mm midline cerebellar vermian metastasis, 57mm left occipital metastasis, 30mm right occipital metastasis and a punctate lesion in the left temporal lobe.  The concensus recommendation is to proceed with a single fraction of SRS to the 4 new metastases. The patient was contacted by Mont Dutton, RTT to review results and recommendations and was in agreement to proceed. Therefore, she comes today for CT Sim for treatment planning with plans to proceed with the [facility] treatment, coordinated with Dr. Vertell Limber, on Wednesday, 05/13/20.  She also recently met with Dr. Lindi Adie on 05/05/2020 with a decision to discontinue the Kadcycla, due to disease progression in the brain, with plans to switch to Tucatinib, oral systemic therapy.     Interval History:   She has remained clinically stable since the time of her last visit and is currently without complaints.  She has continued to tolerate her systemic therapy with Elvera Maria very well aside from fatigue. Her last dose was given 05/05/20 and she will be switching to Tucatinib, orally.  On review of systems, the patient states that she is doing well overall.  She is without complaints aside from fatigue, particularly the day of systemic therapy, and chronic cough.  She has continued with improved energy levels and has been able to continue to work and has recently been helping her parents move to a retirement facility. She denies headaches, decreased visual or auditory acuity, tinnitus, tremor, focal weakness or seizure activity. She is not having difficulty with speech or word finding.  She denies abdominal pain, N/V or diarrhea.  She continues on the Kadcycla systemic therapy and is tolerating this well. She reports a healthy appetite and is maintaining her  weight which she is pleased with.  ALLERGIES:  is allergic to aspirin, protonix [pantoprazole], and iodinated diagnostic agents.  Meds: Current Outpatient Medications  Medication Sig Dispense Refill  . albuterol (VENTOLIN HFA) 108 (90 Base) MCG/ACT inhaler Inhale 2 puffs into the lungs every 6 (six) hours as needed. 8 g 5  . budesonide-formoterol (SYMBICORT) 160-4.5 MCG/ACT inhaler Inhale 2 puffs into the lungs 2 (two) times daily. 3 Inhaler 3  . chlorpheniramine-HYDROcodone (TUSSIONEX) 10-8 MG/5ML SUER Take 5 mLs by mouth at bedtime. (Patient not taking: Reported on 04/20/2020) 473 mL 0  . levalbuterol (XOPENEX) 0.63 MG/3ML nebulizer solution Take 3 mLs (0.63 mg total) by nebulization every 4 (four) hours as needed for wheezing or shortness of breath. 540 mL 6  . lidocaine-prilocaine (EMLA) cream Apply to affected area once (Patient taking differently: Apply 1 application topically daily as needed (port access). ) 30 g 3  . naproxen sodium (ALEVE) 220 MG tablet Take 440 mg by mouth 2 (two) times daily as needed (headache).     Marland Kitchen omeprazole (PRILOSEC) 40 MG capsule Take  1 capsule (40 mg total) by mouth daily. 30 capsule 3  . tucatinib (TUKYSA) 150 MG tablet Take 2 tablets (300 mg total) by mouth in the morning and at bedtime. Take every 12 hrs at the same time each day with or without a meal. 120 tablet 3  . voriconazole (VFEND) 200 MG tablet Take 1 tablet (200 mg total) by mouth 2 (two) times daily. 60 tablet 11   No current facility-administered medications for this visit.    Physical Findings:  vitals were not taken for this visit.   / In general this is a well appearing Caucasian female in no acute distress. She's alert and oriented x4 and appropriate throughout the examination. Cardiopulmonary assessment is negative for acute distress and she exhibits normal effort.    Lab Findings: Lab Results  Component Value Date   WBC 7.2 05/05/2020   HGB 8.8 (L) 05/05/2020   HCT 27.4 (L)  05/05/2020   MCV 92.9 05/05/2020   PLT 231 05/05/2020     Radiographic Findings: MR Brain W Wo Contrast  Addendum Date: 05/04/2020   ADDENDUM REPORT: 05/04/2020 08:07 ADDENDUM: Upon further review, left temporal lesion is favored to reflect metastasis. Electronically Signed   By: Macy Mis M.D.   On: 05/04/2020 08:07   Result Date: 05/04/2020 CLINICAL DATA:  Metastatic breast cancer, follow-up EXAM: MRI HEAD WITHOUT AND WITH CONTRAST TECHNIQUE: Multiplanar, multiecho pulse sequences of the brain and surrounding structures were obtained without and with intravenous contrast. CONTRAST:  5.5 mL Gadavist COMPARISON:  01/30/2020 FINDINGS: Brain: Increase in size of previously punctate midline cerebellar vermian metastasis measuring 3 mm (series 10, image 68). Increase in size of previously punctate left occipital metastasis measuring 4 mm (image 79). New/increased size of 2 mm right occipital metastasis (image 75). Increased enhancement within the anterior left frontal lobe about the resection cavity at the deep margin posterior superiorly (best seen on series 12, image 16 versus series 11, image 20). On this image, area of enhancement measures approximately 10 x 6 mm (previously 7 x 4 mm). Similar adjacent T2 FLAIR hyperintensity. Increased enhancement in the more inferior right frontal lobe also best seen on series 12, image 16. Increased adjacent T2 FLAIR hyperintensity Stable dural enhancement along the craniotomy. Punctate focus of enhancement within the left temporal lobe on series 10, image 73 may be vascular. Attention on follow-up. There is no acute infarction or intracranial hemorrhage. No hydrocephalus. Patchy foci of T2 hyperintensity in the supratentorial white matter are nonspecific but may reflect minor chronic microvascular ischemic changes. Vascular: Major vessel flow voids at the skull base are preserved. Skull and upper cervical spine: Right frontal craniotomy. Normal marrow signal is  preserved. Sinuses/Orbits: Paranasal sinuses are aerated. Orbits are unremarkable. Other: Sella is unremarkable.  Mastoid air cells are clear. IMPRESSION: Three new/increased small cerebellar and bilateral occipital metastases as described. Increased enhancement at the site of previously treated right frontal lesions. Punctate left temporal enhancement, which may be vascular. Attention on follow-up. Electronically Signed: By: Macy Mis M.D. On: 05/01/2020 14:22    Impression/Plan: 1. 54 y.o. 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 We reviewed her recent follow up MRI brain scan from 05/02/19 which showed 4 new metastatic lesions.  Her scan and case were reviewed at the recent brain tumor board on 05/04/20 and recommendation is to proceed with salvage SRS In a single fraction to the new sites of metastases  in the midline cerebellum, left and right occipital and left temporal. We reviewed expected acute and late side effects associated with this treatment.  She has given verbal consent to proceed today and is scheduled for CT SIM/treatment planning today in anticipation of proceeding with treatment on Wednesday, 05/13/20.  Her treatment has been coordinated with neurosurgery, c/o Dr. Vertell Limber, who will also participate in her care. She is comfortable and in agreement with the plan and has freely signed written consent to proceed today in the office. She appears to have a good understanding of her disease and our recommendations and is comfortable and in agreement with the current plan as stated. She knows to call with any questions or concerns in the interim.    Nicholos Johns, PA-C    Tyler Pita, MD  [facility] Direct Dial: [phone]  Fax: [phone] [email or web address]  Skype  LinkedIn

## 2020-05-07 NOTE — Progress Notes (Signed)
Accessed right subclavian power port as ordered. Excellent blood return obtained. Flushed without difficulty. Patient tolerated access well. Access needle secured with transparent dressing. Patient understands access will be removed following simulation.

## 2020-05-07 NOTE — Telephone Encounter (Signed)
Scheduled per 10/26 los. Called and spoke with pt, confirmed 12/6 appts

## 2020-05-07 NOTE — Progress Notes (Signed)
CT simulation complete. Power port flushed per protocol. Access needle removed. Needle intact upon removal. Applied bandaid over old access site. Patient tolerated well.

## 2020-05-09 NOTE — Progress Notes (Signed)
  Radiation Oncology         (336) [phone] ________________________________  Name: Melissa [name] MRN: [id number]  Date: 05/07/2020  DOB: [birth year]  SIMULATION AND TREATMENT PLANNING NOTE    ICD-10-CM   1. Metastasis to brain [facility])  C79.31     DIAGNOSIS:  54 y.o. 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  NARRATIVE:  The patient was brought to the [facility].  Identity was confirmed.  All relevant records and images related to the planned course of therapy were reviewed.  The patient freely provided informed written consent to proceed with treatment after reviewing the details related to the planned course of therapy. The consent form was witnessed and verified by the simulation staff. Intravenous access was established for contrast administration. Then, the patient was set-up in a stable reproducible supine position for radiation therapy.  A relocatable thermoplastic stereotactic head frame was fabricated for precise immobilization.  CT images were obtained.  Surface markings were placed.  The CT images were loaded into the planning software and fused with the patient's targeting MRI scan.  Then the target and avoidance structures were contoured.  Treatment planning then occurred.  The radiation prescription was entered and confirmed.  I have requested 3D planning  I have requested a DVH of the following structures: Brain stem, brain, left eye, right eye, lenses, optic chiasm, target volumes, uninvolved brain, and normal tissue.    SPECIAL TREATMENT PROCEDURE:  The planned course of therapy using radiation constitutes a special treatment procedure. Special care is required in the management of this patient for the following reasons. This treatment constitutes a Special Treatment Procedure for the following reason: High dose per fraction requiring special monitoring for increased toxicities of treatment including  daily imaging.  The special nature of the planned course of radiotherapy will require increased physician supervision and oversight to ensure patient's safety with optimal treatment outcomes.  PLAN:  The patient will receive 20 Gy in 1 fraction to the 4 new metastatic lesions: 82mm midline cerebellar vermian metastasis, 36mm left occipital metastasis, 62mm right occipital metastasis and a punctate lesion in the left temporal lobe.   ________________________________  Sheral Apley Tammi Klippel, M.D.

## 2020-05-11 ENCOUNTER — Other Ambulatory Visit: Payer: Self-pay | Admitting: Adult Health

## 2020-05-11 DIAGNOSIS — C50912 Malignant neoplasm of unspecified site of left female breast: Secondary | ICD-10-CM

## 2020-05-11 MED ORDER — PREDNISONE 50 MG PO TABS: ORAL_TABLET | ORAL | 0 refills | Status: DC

## 2020-05-11 NOTE — Progress Notes (Signed)
Ordering restaging scans with change in treatment.  Contrast prophylaxis sent to her pharmacy.  I called the patient and LMOM.   Wilber Bihari, NP

## 2020-05-12 DIAGNOSIS — C7931 Secondary malignant neoplasm of brain: Secondary | ICD-10-CM | POA: Diagnosis present

## 2020-05-12 NOTE — Telephone Encounter (Signed)
Oral Oncology Pharmacist Encounter   Prior Authorization for Laury Axon (tucatinib) has been denied.     Appeal letter sent to [place] with supporting documentation. Appeal faxed to: [phone]    Oral Oncology Clinic will continue to follow.    Leron Croak, PharmD, BCPS Hematology/Oncology Clinical Pharmacist [facility] [phone] 05/12/2020 2:23 PM

## 2020-05-13 ENCOUNTER — Encounter: Payer: Self-pay | Admitting: Radiation Oncology

## 2020-05-13 ENCOUNTER — Other Ambulatory Visit: Payer: Self-pay

## 2020-05-13 ENCOUNTER — Ambulatory Visit
Admission: RE | Admit: 2020-05-13 | Discharge: 2020-05-13 | Disposition: A | Discharge status: Home / Self Care | Payer: PRIVATE HEALTH INSURANCE | Source: Ambulatory Visit | Attending: Radiation Oncology | Admitting: Radiation Oncology

## 2020-05-13 VITALS — BP 108/62 | HR 80 | Temp 97.8°F | Resp 18

## 2020-05-13 DIAGNOSIS — C7931 Secondary malignant neoplasm of brain: Secondary | ICD-10-CM

## 2020-05-13 NOTE — Op Note (Signed)
  Name: Melissa [name]  MRN: [id number]  Date: 05/13/2020   DOB: [birth year]  Stereotactic Radiosurgery Operative Note  PRE-OPERATIVE DIAGNOSIS:  Multiple Brain Metastases  POST-OPERATIVE DIAGNOSIS:  Multiple Brain Metastases  PROCEDURE:  Stereotactic Radiosurgery  SURGEON:  Peggyann Shoals, MD  NARRATIVE: The patient underwent a radiation treatment planning session in the radiation oncology simulation suite under the care of the radiation oncology physician and physicist.  I participated closely in the radiation treatment planning afterwards. The patient underwent planning CT which was fused to 3T high resolution MRI with 1 mm axial slices.  These images were fused on the planning system.  We contoured the gross target volumes and subsequently expanded this to yield the Planning Target Volume. I actively participated in the planning process.  I helped to define and review the target contours and also the contours of the optic pathway, eyes, brainstem and selected nearby organs at risk.  All the dose constraints for critical structures were reviewed and compared to AAPM Task Group 101.  The prescription dose conformity was reviewed.  I approved the plan electronically.    Accordingly, Melissa [name] was brought to the TrueBeam stereotactic radiation treatment linac and placed in the custom immobilization mask.  The patient was aligned according to the IR fiducial markers with BrainLab Exactrac, then orthogonal x-rays were used in ExacTrac with the 6DOF robotic table and the shifts were made to align the patient  Melissa [name] received stereotactic radiosurgery uneventfully.    Lesions treated:  4   Complex lesions treated:  0 (>3.5 cm, <13mm of optic path, or within the brainstem)   The detailed description of the procedure is recorded in the radiation oncology procedure note.  I was present for the duration of the procedure.  DISPOSITION:  Following delivery, the patient was transported to nursing  in stable condition and monitored for possible acute effects to be discharged to home in stable condition with follow-up in one month.  Peggyann Shoals, MD 05/13/2020 1:54 PM

## 2020-05-13 NOTE — Progress Notes (Signed)
Nurse monitoring following SRS treatment complete. Patient denies new onset of neurologic symptoms. Patient denies pain. Instructed patient to avoid strenuous activity for the next 24 hours. Instructed patient to phone [phone] with any needs after hours related to [facility] treatment. Patient verbalized understanding. Patient discharged home with daughter. Patient exited the clinic ambulatory in no distress.   BP 108/62    Pulse 80    Temp 97.8 F (36.6 C)    Resp 18    LMP 07/25/2012 Comment: pregnancy waiver form signed 01-18-2018   SpO2 100%

## 2020-05-14 ENCOUNTER — Encounter: Payer: Self-pay | Admitting: Infectious Disease

## 2020-05-14 ENCOUNTER — Ambulatory Visit (INDEPENDENT_AMBULATORY_CARE_PROVIDER_SITE_OTHER): Payer: PRIVATE HEALTH INSURANCE | Admitting: Infectious Disease

## 2020-05-14 ENCOUNTER — Ambulatory Visit (HOSPITAL_COMMUNITY): Admission: RE | Admit: 2020-05-14 | Payer: PRIVATE HEALTH INSURANCE | Source: Ambulatory Visit

## 2020-05-14 VITALS — BP 112/67 | HR 98 | Temp 98.5°F | Wt 112.0 lb

## 2020-05-14 DIAGNOSIS — B479 Mycetoma, unspecified: Secondary | ICD-10-CM | POA: Diagnosis not present

## 2020-05-14 DIAGNOSIS — C7931 Secondary malignant neoplasm of brain: Secondary | ICD-10-CM

## 2020-05-14 DIAGNOSIS — Z171 Estrogen receptor negative status [ER-]: Secondary | ICD-10-CM

## 2020-05-14 DIAGNOSIS — F17210 Nicotine dependence, cigarettes, uncomplicated: Secondary | ICD-10-CM | POA: Diagnosis not present

## 2020-05-14 DIAGNOSIS — B449 Aspergillosis, unspecified: Secondary | ICD-10-CM

## 2020-05-14 DIAGNOSIS — J479 Bronchiectasis, uncomplicated: Secondary | ICD-10-CM | POA: Diagnosis not present

## 2020-05-14 DIAGNOSIS — C50212 Malignant neoplasm of upper-inner quadrant of left female breast: Secondary | ICD-10-CM

## 2020-05-14 LAB — AFB ORGANISM ID BY DNA PROBE
M avium complex: NEGATIVE
M gordonae: NEGATIVE
M kansasii: NEGATIVE
M tuberculosis complex: NEGATIVE

## 2020-05-14 LAB — ACID FAST CULTURE WITH REFLEXED SENSITIVITIES (MYCOBACTERIA): Acid Fast Culture: POSITIVE — AB

## 2020-05-14 NOTE — Progress Notes (Signed)
Subjective:  Complaint: Coughing less now  Patient ID: Melissa [name], female    DOB: 12-05-1965, 54 y.o.   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  Along the way she was diagnosed with breast cancer 2013 underwent lumpectomy and chemotherapy.  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  She is following with Dr. Lindi Adie with oncology.  She is subsequent been found to have other metastases including to her sternum and liver.  She developed cavitary lung infection and underwent bronchoscopy which revealed Aspergillus fumigatus.  He has been on Cresemba since then.  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  She also has some spiculated lesions in the posterior left lobe lobe of her lung.  Radiology is concerned that the apparent mycetoma is due to Aspergillus or another mold.  There is similar concerned that the spiculated areas could represent Aspergillus though we have no culture data to guide [name].  She continued on Cresemba.  She does continue to smoke.  She has had bronchoscopy with BAL.  Fungal cultures from the bronchoscopy have been negative though the patient was on Cresemba at the time.  There is 1 AFB culture that is positive but not for tuberculosis the organism ID is apparently been in identified but not available to [name] and [facility] my staff has been calling [facility] and apparently susceptibilities are being run on this mycobacteria.  In the interim the patient's insurance has denied [place] and she has been changed to voriconazole.   Unfortunately she has been found to have metastases of her breast cancer to the brain.  She just underwent stereotactic radiation to these lesions by Dr. London Pepper yesterday.  Her chemotherapeutic regimen includes Tukysa (tucatinib) which can elevate voriconazole levels and potentially cause voriconazole toxicity including QT prolongation.  Therefore we getting a voriconazole level today and checking her EKG her EKG showed a QTC of 448 with a QTC of 465 ms last EKG that I could find in the computer that I could actually read was in 2019 in which she had a QTC of 442.  She does notice some symptoms of palpitations roughly 1/2-hour to an hour after taking her voriconazole,  Does state that she is coughing less since being on voriconazole.  She also though says she was taking Mucinex try to loosen secretions.      Past Medical History:  Diagnosis Date  . Anemia   . Arthritis    knees and hips  . 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 Asthma   . Breast cancer ([place])   . Bronchiectasis ([place])   . Bronchiolitis   . Cancer [facility])    breast cancer 2014  . Cancer, metastatic to liver ([place])    2021  . Complication of anesthesia    bp dropped + desat   . COPD (chronic obstructive pulmonary disease) ([place])   . Dyspnea    DOE  . GERD (gastroesophageal reflux disease)   . H/O coccidioidomycosis    was reason for lung lobectomy  . Headache(784.0)    due to eye strain  or not eating  . History of anemia    no current problem  . History of asthma    as a child  . History of breast cancer 2014   left  . History of chemotherapy    finished 07/17/2013  . History of hiatal hernia    AGE 54  . Hx of radiation therapy 03/25/13-05/06/13   left breast 5000 cGy/25 sessions, left breast boost 1000 cGy/5 sessions  . 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 Pneumonia    LAST FLARE UP 01/2018  . Runny nose 07/30/2013   clear drainage  . Wears dentures    upper    Past Surgical History:  Procedure Laterality Date  .  APPLICATION OF CRANIAL NAVIGATION N/A 08/04/2017   Procedure: APPLICATION OF CRANIAL NAVIGATION;  Surgeon: [name], [name], MD;  Location: [place];  Service: Neurosurgery;  Laterality: N/A;  . AXILLARY LYMPH NODE DISSECTION Left 02/08/2013   Procedure: LEFT AXILLARY DISSECTION;  Surgeon: Edward Jolly, MD;  Location: [place];  Service: General;  Laterality: Left;  . BREAST CYST EXCISION Right 2006  . BREAST LUMPECTOMY Left 2014  . BREAST LUMPECTOMY WITH NEEDLE LOCALIZATION AND AXILLARY SENTINEL LYMPH NODE BX Left 12/31/2012   Procedure: NEEDLE LOCALIZATION LEFT BREAST LUMPECTOMY AND LEFT AXILLARY SENTENIAL LYMPH NODE BX;  Surgeon: Edward Jolly, MD;  Location: [place];  Service: General;  Laterality: Left;  . BRONCHIAL BRUSHINGS  03/17/2020   Procedure: BRONCHIAL BRUSHINGS;  Surgeon: Garner Nash, DO;  Location: [place] [facility];  Service: Pulmonary;;  . BRONCHIAL WASHINGS  03/17/2020   Procedure: BRONCHIAL WASHINGS;  Surgeon: Garner Nash, DO;  Location: [place] [facility];  Service: Pulmonary;;  . CESAREAN SECTION  1995/1996  . CRANIOTOMY Right 08/04/2017   Procedure: Right Frontal craniotomy for resection of tumor with stereotactic navigation;  Surgeon: [name], [name], MD;  Location: [place];  Service: Neurosurgery;  Laterality: Right;  Right Frontal craniotomy for resection of tumor with stereotactic navigation  . IR IMAGING GUIDED PORT INSERTION  05/31/2019  . LUNG LOBECTOMY Left 05/1996   upper lobe - due to [facility] Fever  . PORT-A-CATH REMOVAL Right 08/02/2013   Procedure: REMOVAL PORT-A-CATH;  Surgeon: Edward Jolly, MD;  Location: [place];  Service: General;  Laterality: Right;  . PORTACATH PLACEMENT  07/02/2012   Procedure: INSERTION PORT-A-CATH;  Surgeon: Edward Jolly, MD;  Location: [place];  Service: General;  Laterality: N/A;  right  . VIDEO BRONCHOSCOPY N/A 03/17/2020   Procedure: VIDEO  BRONCHOSCOPY;  Surgeon: Garner Nash, DO;  Location: [place] [facility];  Service: Pulmonary;  Laterality: N/A;  . VIDEO BRONCHOSCOPY WITH ENDOBRONCHIAL ULTRASOUND N/A 05/23/2018   Procedure: VIDEO BRONCHOSCOPY WITH ENDOBRONCHIAL ULTRASOUND;  Surgeon: Garner Nash, DO;  [facility];  Service: Thoracic;  Laterality: N/A;    Family History  Problem Relation Age of Onset  . Emphysema Mother        was a smoker  . Heart disease Mother   . Melanoma Mother        dx in her 30s  . Breast cancer Mother 51  . Asthma Brother   . Breast cancer Cousin        mother's maternal cousin; dx in her 73s      Social History   Socioeconomic History  . Marital status: Married    Spouse name: Not on file  . Number of children: 2  . Years of education: Not on file  .  Highest education level: Not on file  Occupational History  . Occupation: Secondary school teacher  Tobacco Use  . Smoking status: Current Every Day Smoker    Packs/day: 1.00    Years: 38.00    Pack years: 38.00    Types: Cigarettes  . Smokeless tobacco: Never Used  . Tobacco comment: 04/20/20 less than 0.5ppd as of 03/05/20  Vaping Use  . Vaping Use: Never used  Substance and Sexual Activity  . Alcohol use: No  . Drug use: No  . Sexual activity: Not Currently  Other Topics Concern  . Not on file  Social History Narrative  . Not on file   Social Determinants of Health   Financial Resource Strain:   . Difficulty of Paying Living Expenses: Not on file  Food Insecurity:   . Worried About Charity fundraiser in the Last Year: Not on file  . Ran Out of Food in the Last Year: Not on file  Transportation Needs:   . Lack of Transportation (Medical): Not on file  . Lack of Transportation (Non-Medical): Not on file  Physical Activity:   . Days of Exercise per Week: Not on file  . Minutes of Exercise per Session: Not on file  Stress:   . Feeling of Stress : Not on file  Social Connections:   . Frequency of Communication with  Friends and Family: Not on file  . Frequency of Social Gatherings with Friends and Family: Not on file  . Attends Religious Services: Not on file  . Active Member of Clubs or Organizations: Not on file  . Attends Archivist Meetings: Not on file  . Marital Status: Not on file    Allergies  Allergen Reactions  . Aspirin Anaphylaxis and Shortness Of Breath    THROAT CLOSES  . Protonix [Pantoprazole] Nausea Only and Other (See Comments)    Also caused a "film in the mouth" and caused chest pressure  . Iodinated Diagnostic Agents Rash    Patient allergic to contrast used in radiation oncology for CT simulation      Current Outpatient Medications:  .  albuterol (VENTOLIN HFA) 108 (90 Base) MCG/ACT inhaler, Inhale 2 puffs into the lungs every 6 (six) hours as needed., Disp: 8 g, Rfl: 5 .  budesonide-formoterol (SYMBICORT) 160-4.5 MCG/ACT inhaler, Inhale 2 puffs into the lungs 2 (two) times daily., Disp: 3 Inhaler, Rfl: 3 .  levalbuterol (XOPENEX) 0.63 MG/3ML nebulizer solution, Take 3 mLs (0.63 mg total) by nebulization every 4 (four) hours as needed for wheezing or shortness of breath., Disp: 540 mL, Rfl: 6 .  lidocaine-prilocaine (EMLA) cream, Apply to affected area once (Patient taking differently: Apply 1 application topically daily as needed (port access). ), Disp: 30 g, Rfl: 3 .  naproxen sodium (ALEVE) 220 MG tablet, Take 440 mg by mouth 2 (two) times daily as needed (headache). , Disp: , Rfl:  .  omeprazole (PRILOSEC) 40 MG capsule, Take 1 capsule (40 mg total) by mouth daily., Disp: 30 capsule, Rfl: 3 .  predniSONE (DELTASONE) 50 MG tablet, Take 1 tablet 13 hours, 7 hours, and 1 hour prior to scans., Disp: 6 tablet, Rfl: 0 .  tucatinib (TUKYSA) 150 MG tablet, Take 2 tablets (300 mg total) by mouth in the morning and at bedtime. Take every 12 hrs at the same time each day with or without a meal., Disp: 120 tablet, Rfl: 3 .  voriconazole (VFEND) 200 MG tablet, Take 1 tablet  (200 mg total) by mouth  2 (two) times daily., Disp: 60 tablet, Rfl: 11 .  chlorpheniramine-HYDROcodone (TUSSIONEX) 10-8 MG/5ML SUER, Take 5 mLs by mouth at bedtime. (Patient not taking: Reported on 04/20/2020), Disp: 473 mL, Rfl: 0   Review of Systems  Constitutional: Negative for activity change, appetite change, chills, diaphoresis, fatigue, fever and unexpected weight change.  HENT: Negative for congestion, rhinorrhea, sinus pressure, sneezing and trouble swallowing.   Eyes: Negative for photophobia and visual disturbance.  Respiratory: Positive for cough. Negative for chest tightness, wheezing and stridor.   Cardiovascular: Negative for chest pain, palpitations and leg swelling.  Gastrointestinal: Negative for abdominal distention, abdominal pain, anal bleeding, blood in stool, constipation, diarrhea, nausea and vomiting.  Genitourinary: Negative for difficulty urinating, dysuria, flank pain and hematuria.  Musculoskeletal: Negative for arthralgias, back pain, gait problem, joint swelling and myalgias.  Skin: Negative for color change, pallor, rash and wound.  Neurological: Negative for dizziness, tremors, facial asymmetry, weakness and light-headedness.  Hematological: Negative for adenopathy. Does not bruise/bleed easily.  Psychiatric/Behavioral: Negative for agitation, behavioral problems, confusion, decreased concentration, dysphoric mood and sleep disturbance.       Objective:   Physical Exam Constitutional:      General: She is not in acute distress.    Appearance: Normal appearance. She is well-developed. She is not ill-appearing or diaphoretic.  HENT:     Head: Normocephalic and atraumatic.     Right Ear: Hearing and external ear normal.     Left Ear: Hearing and external ear normal.     Nose: No nasal deformity or rhinorrhea.  Eyes:     General: No scleral icterus.    Conjunctiva/sclera: Conjunctivae normal.     Right eye: Right conjunctiva is not injected.     Left  eye: Left conjunctiva is not injected.     Pupils: Pupils are equal, round, and reactive to light.  Neck:     Vascular: No JVD.  Cardiovascular:     Rate and Rhythm: Normal rate and regular rhythm.     Heart sounds: Normal heart sounds, S1 normal and S2 normal. No murmur heard.  No friction rub. No gallop.   Pulmonary:     Effort: Prolonged expiration present.     Breath sounds: No stridor. Wheezing present. No rhonchi.  Abdominal:     General: Bowel sounds are normal. There is no distension.     Palpations: Abdomen is soft.     Tenderness: There is no abdominal tenderness.  Musculoskeletal:        General: Normal range of motion.     Right shoulder: Normal.     Left shoulder: Normal.     Cervical back: Normal range of motion and neck supple.     Right hip: Normal.     Left hip: Normal.     Right knee: Normal.     Left knee: Normal.  Lymphadenopathy:     Head:     Right side of head: No submandibular, preauricular or posterior auricular adenopathy.     Left side of head: No submandibular, preauricular or posterior auricular adenopathy.     Cervical: No cervical adenopathy.     Right cervical: No superficial or deep cervical adenopathy.    Left cervical: No superficial or deep cervical adenopathy.  Skin:    General: Skin is warm and dry.     Coloration: Skin is not pale.     Findings: No abrasion, bruising, ecchymosis, erythema, lesion or rash.     Nails: There is no clubbing.  Neurological:     Mental Status: She is alert and oriented to person, place, and time.     Sensory: No sensory deficit.     Coordination: Coordination normal.     Gait: Gait normal.  Psychiatric:        Attention and Perception: Attention and perception normal. She is attentive.        Mood and Affect: Mood and affect normal.        Speech: Speech normal.        Behavior: Behavior normal. Behavior is cooperative.        Thought Content: Thought content normal.        Cognition and Memory:  Cognition and memory normal.        Judgment: Judgment normal.           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  I do not know that the mycobacteria is a pathogen is only growing on one of the specimen some bronchoalveolar lavage.  We will see what it is and what susceptibilities show.  Aspergillosis: We will continue on Vor QTC is prolonged but not pathological at this point iconazole   History of coccidiomycosis: She is still very worried about this but I have more anxiety about her Aspergillus  Metastatic breast cancer: following with Dr Lindi Adie and now worsening with metastases to the brain.  COVID prevention: she still does not want the vaccine

## 2020-05-18 LAB — VORICONAZOLE QUANT BY LC/MS: Voriconazole, Quant, by LC/MS: 0.9 ug/mL

## 2020-05-18 NOTE — Telephone Encounter (Signed)
Oral Oncology Pharmacist Encounter   Called and left voicemail (phone number: [phone]) on 05/15/20 and 05/18/20 regarding appeal update from ambetter for [place] (tucatinib).   Ms. Grandt called and updated on current status of appeal pending.     Oral Oncology Clinic will continue to follow.    Leron Croak, PharmD, BCPS Hematology/Oncology Clinical Pharmacist [facility] [phone] 05/18/2020 1:17 PM

## 2020-05-19 NOTE — Progress Notes (Signed)
I would probably wait and check again in a few weeks. She is going to start that oncology medication and the drug interaction could increase her levels.

## 2020-05-19 NOTE — Progress Notes (Signed)
Yes, it looks like the pharmacist at the oncology clinic is working on an appeal for her, so she has not started it yet.

## 2020-05-22 NOTE — Telephone Encounter (Signed)
Oral Oncology Pharmacist Encounter  Met with patient while in clinic today to complete application for [place] [employer] in an effort to reduce the patient's out of pocket expense for Tukysa (tucatinib) to $0.  Application completed and faxed to: [facility] patient assistance phone number for follow up is: [phone]  This encounter will be updated until final determination.   Leron Croak, PharmD, BCPS Hematology/Oncology Clinical Pharmacist [facility] [phone] 05/22/2020 11:15 AM

## 2020-05-24 NOTE — Progress Notes (Signed)
  Radiation Oncology         ([phone]) [phone] ________________________________  Stereotactic Treatment Procedure Note  Name: DEJAE [name] MRN: [id number]  Date: 05/13/2020  DOB: 05/02/1966  SPECIAL TREATMENT PROCEDURE    ICD-10-CM   1. Metastasis to brain [facility])  C79.31     3D TREATMENT PLANNING AND DOSIMETRY:  The patient's radiation plan was reviewed and approved by neurosurgery and radiation oncology prior to treatment.  It showed 3-dimensional radiation distributions overlaid onto the planning CT/MRI image set.  The [facility] for the target structures as well as the organs at risk were reviewed. The documentation of the 3D plan and dosimetry are filed in the radiation oncology EMR.  NARRATIVE:  WAVER DIBIASIO was brought to the TrueBeam stereotactic radiation treatment machine and placed supine on the CT couch. The head frame was applied, and the patient was set up for stereotactic radiosurgery.  Neurosurgery was present for the set-up and delivery  SIMULATION VERIFICATION:  In the couch zero-angle position, the patient underwent Exactrac imaging using the Brainlab system with orthogonal KV images.  These were carefully aligned and repeated to confirm treatment position for each of the isocenters.  The Exactrac snap film verification was repeated at each couch angle.  PROCEDURE: Craig Guess Droge received stereotactic radiosurgery to the following targets: Four targets were treated with a single isocenter:  PTV6 Mid Cerebellum 37mm PTV7 Lt Occipital 62mm PTV8 Lt Occipital 66mm PTV9 Lt Temporal 4mm   These were treated using 6 Rapid Arc VMAT Beams to a prescription dose of 20 Gy.  ExacTrac registration was performed for each couch angle.  The 100% isodose line was prescribed with 120.9% hotspot.  6 MV X-rays were delivered in the flattening filter free beam mode.  STEREOTACTIC TREATMENT MANAGEMENT:  Following delivery, the patient was transported to nursing in stable condition and monitored for  possible acute effects.  Vital signs were recorded BP 108/62   Pulse 80   Temp 97.8 F (36.6 C)   Resp 18   LMP 07/25/2012 Comment: pregnancy waiver form signed 01-18-2018  SpO2 100% . The patient tolerated treatment without significant acute effects, and was discharged to home in stable condition.    PLAN: Follow-up in one month.  ________________________________  Sheral Apley. Tammi Klippel, M.D.

## 2020-05-25 ENCOUNTER — Other Ambulatory Visit: Payer: Self-pay | Admitting: Hematology and Oncology

## 2020-05-25 DIAGNOSIS — C50912 Malignant neoplasm of unspecified site of left female breast: Secondary | ICD-10-CM

## 2020-05-25 DIAGNOSIS — C50212 Malignant neoplasm of upper-inner quadrant of left female breast: Secondary | ICD-10-CM

## 2020-05-25 MED ORDER — HYDROCOD POLST-CPM POLST ER 10-8 MG/5ML PO SUER: 5.0000 mL | Freq: Every day | ORAL | 0 refills | Status: DC

## 2020-05-26 ENCOUNTER — Other Ambulatory Visit: Payer: PRIVATE HEALTH INSURANCE

## 2020-05-26 ENCOUNTER — Ambulatory Visit: Payer: PRIVATE HEALTH INSURANCE

## 2020-05-26 ENCOUNTER — Ambulatory Visit: Payer: PRIVATE HEALTH INSURANCE | Admitting: Hematology and Oncology

## 2020-05-28 ENCOUNTER — Telehealth: Payer: Self-pay

## 2020-05-28 ENCOUNTER — Ambulatory Visit (HOSPITAL_COMMUNITY)
Admission: RE | Admit: 2020-05-28 | Discharge: 2020-05-28 | Disposition: A | Discharge status: Home / Self Care | Payer: PRIVATE HEALTH INSURANCE | Source: Ambulatory Visit | Attending: Adult Health | Admitting: Adult Health

## 2020-05-28 ENCOUNTER — Other Ambulatory Visit: Payer: Self-pay

## 2020-05-28 ENCOUNTER — Other Ambulatory Visit: Payer: Self-pay | Admitting: Hematology and Oncology

## 2020-05-28 ENCOUNTER — Ambulatory Visit (HOSPITAL_COMMUNITY): Payer: PRIVATE HEALTH INSURANCE

## 2020-05-28 ENCOUNTER — Telehealth: Payer: Self-pay | Admitting: Hematology and Oncology

## 2020-05-28 DIAGNOSIS — C7931 Secondary malignant neoplasm of brain: Secondary | ICD-10-CM | POA: Diagnosis present

## 2020-05-28 DIAGNOSIS — C50912 Malignant neoplasm of unspecified site of left female breast: Secondary | ICD-10-CM | POA: Insufficient documentation

## 2020-05-28 DIAGNOSIS — C50212 Malignant neoplasm of upper-inner quadrant of left female breast: Secondary | ICD-10-CM

## 2020-05-28 MED ORDER — CAPECITABINE 500 MG PO TABS: 1000.0000 mg | ORAL_TABLET | Freq: Two times a day (BID) | ORAL | 3 refills | Status: DC

## 2020-05-28 MED ORDER — HEPARIN SOD (PORK) LOCK FLUSH 100 UNIT/ML IV SOLN
500.0000 [IU] | Freq: Once | INTRAVENOUS | Status: AC
  Administered 2020-05-28: 500 [IU] via INTRAVENOUS

## 2020-05-28 MED ORDER — IOHEXOL 300 MG/ML  SOLN
100.0000 mL | Freq: Once | INTRAMUSCULAR | Status: AC | PRN
  Administered 2020-05-28: 100 mL via INTRAVENOUS

## 2020-05-28 MED ORDER — HEPARIN SOD (PORK) LOCK FLUSH 100 UNIT/ML IV SOLN
INTRAVENOUS | Status: AC
  Filled 2020-05-28: qty 5

## 2020-05-28 NOTE — Telephone Encounter (Signed)
Progressive HER-2 positive metastatic breast cancer  Treatment plan: Tucatinib with Xeloda and Herceptin We plan to start this as soon as it becomes approved. Her insurance had denied it but we are appealing to get this approved.

## 2020-05-28 NOTE — Telephone Encounter (Signed)
Oral Oncology Pharmacist Encounter  Received new prescription for Xeloda (capecitabine) for the treatment of metastatic ER/PR positive, HER-2 positive breast cancer, in conjunction with Tukysa and trastuzumab, planned duration until disease progression or unacceptable drug toxicity.  Prescription dose and frequency assessed for appropriateness. Appropriate for therapy initiation.   CBC w/ Diff and CMP from 05/05/20 assessed, Hgb down to 8.8 g/dL - all other labs stable.  Current medication list in Epic reviewed, DDIs with Xeloda (capecitabine) identified: Category C DDI between Xeloda and Omeprazole - proton-pump inhibitors can decrease efficacy of Xeloda - will discuss with patient alternatives to omeprazole, such as H2RA's like famotidine while on Xeloda.  Evaluated chart and no patient barriers to medication adherence noted.   Prescription has been e-scribed to the [facility] for benefits analysis and approval.  Oral Oncology Clinic will continue to follow for insurance authorization, copayment issues, initial counseling and start date.  Leron Croak, PharmD, BCPS Hematology/Oncology Clinical Pharmacist [facility] [phone] 05/28/2020 8:34 AM

## 2020-05-28 NOTE — Telephone Encounter (Signed)
Oral Oncology Patient Advocate Encounter  Received notification from [employer] that prior authorization for Xeloda is required.  PA submitted on CoverMyMeds Key [id number] Status is pending  Oral Oncology Clinic will continue to follow.  [place] Patient Melissa [name] Phone [phone] Fax [phone] 05/28/2020 9:18 AM

## 2020-05-30 ENCOUNTER — Telehealth: Payer: Self-pay | Admitting: Adult Health

## 2020-05-30 NOTE — Telephone Encounter (Signed)
Called patient to give scan results of CT chest/abd/pelvis and there is no evidence of progressive disease.  She had many questions about her treatment regimen, and the status of her authorization, and when her trastuzumab infusions might be scheduled.    She asked about Trastuzumab, and I reminded her it was the other name of herceptin, and she received this treatment back when she was treated initially for breast cancer 5-6 years ago.  I reviewed that it is an IV infusion given every three weeks.  The main risk is weakening in the heart muscle, and so we will need to do echocardiograms every 3 months.  She and I also reviewed her other two treatments will be the Tucatinib and the Capecitabine, which are still awaiting authorization.   Melissa [name] is coming in on Monday, 06/01/2020 to complete some paperwork for her oral treatments, and hopefully we will be able to give more details.    Wilber Bihari, NP

## 2020-06-01 NOTE — Telephone Encounter (Signed)
Oral Oncology Pharmacist Encounter    2nd level expedited appeal letter sent to [facility] Department of Insurance with supporting documentation. Appeal faxed to: [phone]    Oral Oncology Clinic will continue to follow.    Leron Croak, PharmD, BCPS Hematology/Oncology Clinical Pharmacist [facility] [phone] 06/01/2020 11:42 AM

## 2020-06-01 NOTE — Telephone Encounter (Signed)
Oral Oncology Patient Advocate Encounter  Prior Authorization for Xeloda has been approved.    PA# [id number] Effective dates: 05/28/20 through 11/25/20  Patients co-pay is $0  Oral Oncology Clinic will continue to follow.    [place] Patient [place] Phone [phone] Fax [phone] 06/01/2020 10:55 AM

## 2020-06-03 NOTE — Telephone Encounter (Signed)
Oral Oncology Pharmacist Encounter   Notified by the [facility] Department of Insurance that Laury Axon (tucatinib) denial was overturned and Laury Axon will be approved for patient.   Oral Oncology Clinic will continue to follow.   Leron Croak, PharmD, BCPS Hematology/Oncology Clinical Pharmacist [facility] [phone] 06/03/2020 10:27 AM

## 2020-06-08 MED FILL — TUKYSA 150 MG TABS: 150 | 30 days supply | Qty: 120 | Fill #0

## 2020-06-08 MED FILL — CAPECITABINE 500 MG TABLET: 500 | 21 days supply | Qty: 56 | Fill #0

## 2020-06-08 NOTE — Telephone Encounter (Signed)
Oral Chemotherapy Pharmacist Encounter   Spoke with patient to provide update and offer for initial counseling on oral medication: Xeloda and Tukysa.   Laury Axon and Xeloda have $0 copays - Tukysa ordered on 06/05/20 to have enough quantity on hand to dispense to patient. Expect medication to be delivered to [facility] on 06/09/20.  Patient requested call back on 06/10/20 to discuss details of medication acquisition and initial counseling session.  Leron Croak, PharmD, BCPS Hematology/Oncology Clinical Pharmacist [facility] [phone] 06/08/2020 11:14 AM

## 2020-06-08 NOTE — Telephone Encounter (Signed)
I was notified by the oncology clinical pharmacist that patient was finally approved for her Laury Axon. She will start this 12/1. FYI.

## 2020-06-08 NOTE — Telephone Encounter (Signed)
I think she is on schedule this week w me?

## 2020-06-09 ENCOUNTER — Other Ambulatory Visit: Payer: Self-pay | Admitting: *Deleted

## 2020-06-09 ENCOUNTER — Telehealth: Payer: Self-pay | Admitting: *Deleted

## 2020-06-09 DIAGNOSIS — Z5181 Encounter for therapeutic drug level monitoring: Secondary | ICD-10-CM

## 2020-06-09 DIAGNOSIS — I427 Cardiomyopathy due to drug and external agent: Secondary | ICD-10-CM

## 2020-06-09 NOTE — Telephone Encounter (Signed)
Received VM from pt.  Attempt x1 to return call.  No answer, LVM to return call to the office.  

## 2020-06-09 NOTE — Progress Notes (Signed)
Pharmacist Chemotherapy Monitoring - Initial Assessment    Anticipated start date: 06/15/20   Regimen:   Are orders appropriate based on the patients diagnosis, regimen, and cycle? Yes  Does the plan date match the patients scheduled date? Yes  Is the sequencing of drugs appropriate? Yes  Are the premedications appropriate for the patients regimen? Yes  Prior Authorization for treatment is: Denied  Her insurance had denied it but we are appealing to get this approved per Dr. Lindi Adie o If applicable, is the correct biosimilar selected based on the patient's insurance? yes  Organ Function and Labs:  Are dose adjustments needed based on the patient's renal function, hepatic function, or hematologic function? No  Are appropriate labs ordered prior to the start of patient's treatment? Yes  Other organ system assessment, if indicated: trastuzumab: Echo/ MUGA - needs updated; last done 01/17/20.  The following baseline labs, if indicated, have been ordered: N/A  Dose Assessment:  Are the drug doses appropriate? Yes  Are the following correct: o Drug concentrations Yes o IV fluid compatible with drug Yes o Administration routes Yes o Timing of therapy Yes  If applicable, does the patient have documented access for treatment and/or plans for port-a-cath placement? yes  If applicable, have lifetime cumulative doses been properly documented and assessed? yes Lifetime Dose Tracking   Carboplatin: 3,840 mg = 0.01 % of the maximum lifetime dose of 999,999,999 mg  o   Toxicity Monitoring/Prevention:  The patient has the following take home antiemetics prescribed: N/A  The patient has the following take home medications prescribed: N/A  Medication allergies and previous infusion related reactions, if applicable, have been reviewed and addressed. Yes  The patient's current medication list has been assessed for drug-drug interactions with their chemotherapy regimen. no significant  drug-drug interactions were identified on review.  Order Review:  Are the treatment plan orders signed? Yes  Is the patient scheduled to see a provider prior to their treatment? Yes  I verify that I have reviewed each item in the above checklist and answered each question accordingly.   Kennith Center, Pharm.D., CPP 06/09/2020@1 :47 PM

## 2020-06-10 ENCOUNTER — Ambulatory Visit (HOSPITAL_COMMUNITY)
Admission: RE | Admit: 2020-06-10 | Discharge: 2020-06-10 | Disposition: A | Discharge status: Home / Self Care | Payer: PRIVATE HEALTH INSURANCE | Source: Ambulatory Visit | Attending: Hematology and Oncology | Admitting: Hematology and Oncology

## 2020-06-10 ENCOUNTER — Telehealth: Payer: Self-pay | Admitting: Pharmacist

## 2020-06-10 ENCOUNTER — Ambulatory Visit (INDEPENDENT_AMBULATORY_CARE_PROVIDER_SITE_OTHER): Payer: PRIVATE HEALTH INSURANCE | Admitting: Infectious Disease

## 2020-06-10 ENCOUNTER — Other Ambulatory Visit: Payer: Self-pay

## 2020-06-10 ENCOUNTER — Encounter: Payer: Self-pay | Admitting: Infectious Disease

## 2020-06-10 VITALS — BP 114/74 | HR 94 | Temp 98.6°F | Wt 109.8 lb

## 2020-06-10 DIAGNOSIS — Z171 Estrogen receptor negative status [ER-]: Secondary | ICD-10-CM

## 2020-06-10 DIAGNOSIS — Z8619 Personal history of other infectious and parasitic diseases: Secondary | ICD-10-CM

## 2020-06-10 DIAGNOSIS — B449 Aspergillosis, unspecified: Secondary | ICD-10-CM

## 2020-06-10 DIAGNOSIS — Z01818 Encounter for other preprocedural examination: Secondary | ICD-10-CM | POA: Diagnosis not present

## 2020-06-10 DIAGNOSIS — C7931 Secondary malignant neoplasm of brain: Secondary | ICD-10-CM

## 2020-06-10 DIAGNOSIS — B479 Mycetoma, unspecified: Secondary | ICD-10-CM

## 2020-06-10 DIAGNOSIS — Z5181 Encounter for therapeutic drug level monitoring: Secondary | ICD-10-CM | POA: Insufficient documentation

## 2020-06-10 DIAGNOSIS — Z0189 Encounter for other specified special examinations: Secondary | ICD-10-CM

## 2020-06-10 DIAGNOSIS — C801 Malignant (primary) neoplasm, unspecified: Secondary | ICD-10-CM | POA: Insufficient documentation

## 2020-06-10 DIAGNOSIS — J449 Chronic obstructive pulmonary disease, unspecified: Secondary | ICD-10-CM | POA: Insufficient documentation

## 2020-06-10 DIAGNOSIS — C50912 Malignant neoplasm of unspecified site of left female breast: Secondary | ICD-10-CM

## 2020-06-10 DIAGNOSIS — F17200 Nicotine dependence, unspecified, uncomplicated: Secondary | ICD-10-CM | POA: Insufficient documentation

## 2020-06-10 DIAGNOSIS — Z79899 Other long term (current) drug therapy: Secondary | ICD-10-CM | POA: Diagnosis not present

## 2020-06-10 DIAGNOSIS — C50212 Malignant neoplasm of upper-inner quadrant of left female breast: Secondary | ICD-10-CM

## 2020-06-10 DIAGNOSIS — J438 Other emphysema: Secondary | ICD-10-CM

## 2020-06-10 LAB — ECHOCARDIOGRAM COMPLETE
Area-P 1/2: 4.06 cm2
S' Lateral: 2.5 cm

## 2020-06-10 NOTE — Progress Notes (Signed)
  Echocardiogram 2D Echocardiogram has been performed.  Melissa [name] 06/10/2020, 9:06 AM

## 2020-06-10 NOTE — Telephone Encounter (Signed)
Oral Chemotherapy Pharmacist Encounter  Patient Education I spoke with patient for overview of new oral chemotherapy medication  Xeloda (capecitabine) for the treatment of metastatic ER/PR positive, HER-2 positive breast cancer, in conjunction with Tukysa and trastuzumab, planned duration until disease progression or unacceptable drug toxicity  Pt is doing well. Counseled patient on administration, dosing, side effects, monitoring, drug-food interactions, safe handling, storage, and disposal.  Patient will take Xeloda two 500 mg tablets (1000 mg total) twice daily 30 minutes after a meal for 14 days on and 7 days off of a 21 day cycle. Tukysa two 150 mg (300 mg total) twice daily continuously.    Side effects include but not limited to: diarrhea, nausea, hand foot syndrome, stomatitis, and changes in electrolytes/lab values  Reviewed with patient importance of keeping a medication schedule and plan for any missed doses.  Ms. Lauritsen voiced understanding and appreciation. All questions answered.  Provided patient with [facility] phone number. Patient knows to call the office with questions or concerns. [facility] will continue to follow.  Eddie Candle, PharmD, BCPS PGY2 Hematology/Oncology Pharmacy Resident [facility]otherapy [facility] 06/10/2020 12:39 PM

## 2020-06-10 NOTE — Progress Notes (Signed)
Subjective:  Complaint: Coughing less now  Patient ID: Melissa [name], female    DOB: Sep 24, 1965, 54 y.o.   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  Along the way Melissa [name] was diagnosed with breast cancer 2013 underwent lumpectomy and chemotherapy.  In 2019 Melissa [name] was diagnosed with metastatic disease with metastases to the brain status post resection and stereotactic radiation.  Melissa [name] is following with Melissa [name] with oncology.  Melissa [name] is subsequent been found to have other metastases including to Melissa [name] sternum and liver.  Melissa [name] developed cavitary lung infection and underwent bronchoscopy which revealed Aspergillus fumigatus.  He has been on Cresemba since then.  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  Melissa [name] also has some spiculated lesions in the posterior left lobe lobe of Melissa [name] lung.  Radiology is concerned that the apparent mycetoma is due to Aspergillus or another mold.  There is similar concerned that the spiculated areas could represent Aspergillus though we have no culture data to guide [name].  Melissa [name] continued on Cresemba.  Melissa [name] has had bronchoscopy with BAL.  Fungal cultures from the bronchoscopy have been negative though the patient was on Cresemba at the time.  There is 1 AFB culture that is positive but not for tuberculosis the organism ID is apparently been in identified   Since then apparently (and I had to go to [facility] on web (not available on epic) they stated that Mycobacteria could NOT grow on subculture so no ID  In the interim the patient's insurance had denied Cresemba and Melissa [name] has been changed to voriconazole.   Unfortunately Melissa [name] has been found to have metastases of Melissa [name] breast cancer to the brain.  Melissa [name]  underwent stereotactic radiation to these lesions by Dr. London Pepper yesterday.  Melissa [name] chemotherapeutic regimen includes Tukysa (tucatinib) which can elevate voriconazole levels and potentially cause voriconazole toxicity including QT prolongation.  We got voriconazole level  In Novmeber which was 0.9  EKG showed:  Melissa [name] EKG showed a QTC of 448 with a QTC of 465 ms last EKG that I could find in the computer that I could actually read was in 2019 in which Melissa [name] had a QTC of 442.   Does state that Melissa [name] is coughing less since being on voriconazole.  Melissa [name] also though says Melissa [name] was taking Mucinex try to loosen secretions.  Melissa [name] has been given Melissa [name] new chemotherapy pills Melissa [name] has not started them yet we will start them on Monday when Melissa [name] has Melissa [name] first infusion.  Melissa [name] says that Melissa [name] cough is dramatically better since being on work voriconazole and Melissa [name] finds it to be more effective than when Melissa [name] was taking [place].     Past Medical History:  Diagnosis Date  . Anemia   . Arthritis    knees and hips  . 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 Asthma   . Breast cancer ([place])   . Bronchiectasis ([place])   . Bronchiolitis   . Cancer [facility])    breast cancer 2014  . Cancer, metastatic to liver ([place])    2021  . Complication of anesthesia    bp dropped + desat   .  COPD (chronic obstructive pulmonary disease) ([place])   . Dyspnea    DOE  . GERD (gastroesophageal reflux disease)   . H/O coccidioidomycosis    was reason for lung lobectomy  . Headache(784.0)    due to eye strain or not eating  . History of anemia    no current problem  . History of asthma    as a child  . History of breast cancer 2014   left  . History of chemotherapy    finished 07/17/2013  . History of hiatal hernia    AGE 61  . Hx of radiation therapy 03/25/13-05/06/13   left breast 5000 cGy/25 sessions, left breast boost 1000 cGy/5 sessions  .  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 Pneumonia    LAST FLARE UP 01/2018  . Runny nose 07/30/2013   clear drainage  . Wears dentures    upper    Past Surgical History:  Procedure Laterality Date  . APPLICATION OF CRANIAL NAVIGATION N/A 08/04/2017   Procedure: APPLICATION OF CRANIAL NAVIGATION;  Surgeon: [name], [name], MD;  Location: [facility];  Service: Neurosurgery;  Laterality: N/A;  . AXILLARY LYMPH NODE DISSECTION Left 02/08/2013   Procedure: LEFT AXILLARY DISSECTION;  Surgeon: Edward Jolly, MD;  Location: [facility];  Service: General;  Laterality: Left;  . BREAST CYST EXCISION Right 2006  . BREAST LUMPECTOMY Left 2014  . BREAST LUMPECTOMY WITH NEEDLE LOCALIZATION AND AXILLARY SENTINEL LYMPH NODE BX Left 12/31/2012   Procedure: NEEDLE LOCALIZATION LEFT BREAST LUMPECTOMY AND LEFT AXILLARY SENTENIAL LYMPH NODE BX;  Surgeon: Edward Jolly, MD;  Location: [facility];  Service: General;  Laterality: Left;  . BRONCHIAL BRUSHINGS  03/17/2020   Procedure: BRONCHIAL BRUSHINGS;  Surgeon: Garner Nash, DO;  Location: [facility];  Service: Pulmonary;;  . BRONCHIAL WASHINGS  03/17/2020   Procedure: BRONCHIAL WASHINGS;  Surgeon: Garner Nash, DO;  Location: [facility];  Service: Pulmonary;;  . CESAREAN SECTION  1995/1996  . CRANIOTOMY Right 08/04/2017   Procedure: Right Frontal craniotomy for resection of tumor with stereotactic navigation;  Surgeon: [name], [name], MD;  Location: [facility];  Service: Neurosurgery;  Laterality: Right;  Right Frontal craniotomy for resection of tumor with stereotactic navigation  . IR IMAGING GUIDED PORT INSERTION  05/31/2019  . LUNG LOBECTOMY Left 05/1996   upper lobe - due to [facility] Fever  . PORT-A-CATH REMOVAL Right 08/02/2013   Procedure: REMOVAL PORT-A-CATH;  Surgeon: Edward Jolly, MD;  Location: [place];  Service: General;  Laterality: Right;  . PORTACATH PLACEMENT  07/02/2012   Procedure:  INSERTION PORT-A-CATH;  Surgeon: Edward Jolly, MD;  Location: [facility];  Service: General;  Laterality: N/A;  right  . VIDEO BRONCHOSCOPY N/A 03/17/2020   Procedure: VIDEO BRONCHOSCOPY;  Surgeon: Garner Nash, DO;  Location: [facility];  Service: Pulmonary;  Laterality: N/A;  . VIDEO BRONCHOSCOPY WITH ENDOBRONCHIAL ULTRASOUND N/A 05/23/2018   Procedure: VIDEO BRONCHOSCOPY WITH ENDOBRONCHIAL ULTRASOUND;  Surgeon: Garner Nash, DO;  Location: [facility];  Service: Thoracic;  Laterality: N/A;    Family History  Problem Relation Age of Onset  . Emphysema Mother        was a smoker  . Heart disease Mother   . Melanoma Mother        dx in Melissa [name] 69s  . Breast cancer Mother 54  . Asthma Brother   . Breast cancer Cousin        mother's maternal  cousin; dx in Melissa [name] 61s      Social History   Socioeconomic History  . Marital status: Married    Spouse name: Not on file  . Number of children: 2  . Years of education: Not on file  . Highest education level: Not on file  Occupational History  . Occupation: Secondary school teacher  Tobacco Use  . Smoking status: Current Every Day Smoker    Packs/day: 1.00    Years: 38.00    Pack years: 38.00    Types: Cigarettes  . Smokeless tobacco: Never Used  . Tobacco comment: 04/20/20 less than 0.5ppd as of 03/05/20  Vaping Use  . Vaping Use: Never used  Substance and Sexual Activity  . Alcohol use: No  . Drug use: No  . Sexual activity: Not Currently  Other Topics Concern  . Not on file  Social History Narrative  . Not on file   Social Determinants of Health   Financial Resource Strain:   . Difficulty of Paying Living Expenses: Not on file  Food Insecurity:   . Worried About Charity fundraiser in the Last Year: Not on file  . Ran Out of Food in the Last Year: Not on file  Transportation Needs:   . Lack of Transportation (Medical): Not on file  . Lack of Transportation (Non-Medical): Not on file  Physical Activity:   .  Days of Exercise per Week: Not on file  . Minutes of Exercise per Session: Not on file  Stress:   . Feeling of Stress : Not on file  Social Connections:   . Frequency of Communication with Friends and Family: Not on file  . Frequency of Social Gatherings with Friends and Family: Not on file  . Attends Religious Services: Not on file  . Active Member of Clubs or Organizations: Not on file  . Attends Archivist Meetings: Not on file  . Marital Status: Not on file    Allergies  Allergen Reactions  . Aspirin Anaphylaxis and Shortness Of Breath    THROAT CLOSES  . Protonix [Pantoprazole] Nausea Only and Other (See Comments)    Also caused a "film in the mouth" and caused chest pressure  . Iodinated Diagnostic Agents Rash    Patient allergic to contrast used in radiation oncology for CT simulation      Current Outpatient Medications:  .  albuterol (VENTOLIN HFA) 108 (90 Base) MCG/ACT inhaler, Inhale 2 puffs into the lungs every 6 (six) hours as needed., Disp: 8 g, Rfl: 5 .  budesonide-formoterol (SYMBICORT) 160-4.5 MCG/ACT inhaler, Inhale 2 puffs into the lungs 2 (two) times daily., Disp: 3 Inhaler, Rfl: 3 .  capecitabine (XELODA) 500 MG tablet, Take 2 tablets (1,000 mg total) by mouth 2 (two) times daily after a meal. Take 2 tabs twice daily for 14 days on and 7 days off, Disp: 56 tablet, Rfl: 3 .  chlorpheniramine-HYDROcodone (TUSSIONEX) 10-8 MG/5ML SUER, Take 5 mLs by mouth at bedtime., Disp: 473 mL, Rfl: 0 .  levalbuterol (XOPENEX) 0.63 MG/3ML nebulizer solution, Take 3 mLs (0.63 mg total) by nebulization every 4 (four) hours as needed for wheezing or shortness of breath., Disp: 540 mL, Rfl: 6 .  naproxen sodium (ALEVE) 220 MG tablet, Take 440 mg by mouth 2 (two) times daily as needed (headache). , Disp: , Rfl:  .  omeprazole (PRILOSEC) 40 MG capsule, Take 1 capsule (40 mg total) by mouth daily., Disp: 30 capsule, Rfl: 3 .  predniSONE (DELTASONE) 50 MG  tablet, Take 1 tablet  13 hours, 7 hours, and 1 hour prior to scans., Disp: 6 tablet, Rfl: 0 .  tucatinib (TUKYSA) 150 MG tablet, Take 2 tablets (300 mg total) by mouth in the morning and at bedtime. Take every 12 hrs at the same time each day with or without a meal., Disp: 120 tablet, Rfl: 3 .  voriconazole (VFEND) 200 MG tablet, Take 1 tablet (200 mg total) by mouth 2 (two) times daily., Disp: 60 tablet, Rfl: 11   Review of Systems  Constitutional: Negative for activity change, appetite change, chills, diaphoresis, fatigue, fever and unexpected weight change.  HENT: Negative for congestion, rhinorrhea, sinus pressure, sneezing and trouble swallowing.   Eyes: Negative for photophobia and visual disturbance.  Respiratory: Positive for cough. Negative for chest tightness, shortness of breath, wheezing and stridor.   Cardiovascular: Negative for chest pain, palpitations and leg swelling.  Gastrointestinal: Negative for abdominal distention, abdominal pain, anal bleeding, blood in stool, constipation, diarrhea, nausea and vomiting.  Genitourinary: Negative for difficulty urinating, dysuria, flank pain and hematuria.  Musculoskeletal: Negative for arthralgias, back pain, gait problem, joint swelling and myalgias.  Skin: Negative for color change, pallor, rash and wound.  Neurological: Negative for dizziness, tremors, facial asymmetry, weakness and light-headedness.  Hematological: Negative for adenopathy. Does not bruise/bleed easily.  Psychiatric/Behavioral: Negative for agitation, behavioral problems, confusion, decreased concentration, dysphoric mood and sleep disturbance.       Objective:   Physical Exam Constitutional:      General: Melissa [name] is not in acute distress.    Appearance: Normal appearance. Melissa [name] is well-developed. Melissa [name] is not ill-appearing or diaphoretic.  HENT:     Head: Normocephalic and atraumatic.     Right Ear: Hearing and external ear normal.     Left Ear: Hearing and external ear normal.     Nose:  No nasal deformity or rhinorrhea.  Eyes:     General: No scleral icterus.    Conjunctiva/sclera: Conjunctivae normal.     Right eye: Right conjunctiva is not injected.     Left eye: Left conjunctiva is not injected.  Neck:     Vascular: No JVD.  Cardiovascular:     Rate and Rhythm: Normal rate and regular rhythm.     Heart sounds: S1 normal and S2 normal.  Pulmonary:     Effort: Prolonged expiration present. No respiratory distress.     Breath sounds: No stridor. No wheezing or rhonchi.  Abdominal:     General: There is no distension.     Palpations: Abdomen is soft.  Musculoskeletal:        General: Normal range of motion.     Right shoulder: Normal.     Left shoulder: Normal.     Cervical back: Normal range of motion and neck supple.     Right hip: Normal.     Left hip: Normal.     Right knee: Normal.     Left knee: Normal.  Lymphadenopathy:     Head:     Right side of head: No submandibular, preauricular or posterior auricular adenopathy.     Left side of head: No submandibular, preauricular or posterior auricular adenopathy.     Cervical: No cervical adenopathy.     Right cervical: No superficial or deep cervical adenopathy.    Left cervical: No superficial or deep cervical adenopathy.  Skin:    General: Skin is warm and dry.     Coloration: Skin is not pale.     Findings:  No abrasion, bruising, ecchymosis, erythema, lesion or rash.     Nails: There is no clubbing.  Neurological:     Mental Status: Melissa [name] is alert and oriented to person, place, and time.     Sensory: No sensory deficit.     Coordination: Coordination normal.     Gait: Gait normal.  Psychiatric:        Attention and Perception: Attention and perception normal. Melissa [name] is attentive.        Mood and Affect: Mood and affect normal.        Speech: Speech normal.        Behavior: Behavior normal. Behavior is cooperative.        Thought Content: Thought content normal.        Cognition and Memory: Cognition and  memory normal.        Judgment: Judgment normal.           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  The mycobacterial species never grew on culture.  If significance is not clear at all.  Aspergillosis: We will continue on Vor QTC is prolonged but not pathological at this point6.  Given drug drug interactions and likelihood that the levels will increase with Melissa [name] chemotherapeutic drugs we will check a level on 13 December   History of coccidiomycosis: Melissa [name] is still very worried about this but I have more anxiety about Melissa [name] Aspergillus  Metastatic breast cancer: following with Dr Lindi [name] and now worsening with metastases to the brain.

## 2020-06-14 NOTE — Progress Notes (Signed)
Patient Care Team: Patient, [name] as PCP - General (General Practice) [name], MD as Referring Physician (Emergency Medicine)  DIAGNOSIS:    ICD-10-CM   1. 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 Malignant neoplasm of upper-inner quadrant of left breast in female, estrogen receptor negative (Melissa [name])  C50.212    Z17.1     SUMMARY OF ONCOLOGIC HISTORY: Oncology History  Breast cancer of upper-inner quadrant of left female breast (Melissa [name])  06/08/2012 Initial Diagnosis   invasive ductal carcinoma that was ER positive PR positive HER-2/neu positive measuring 3.1 cm by MRI criteria. Ki-67 was 70% HER-2 was amplified with a ratio 2.91   07/12/2012 - 07/17/2013 Neo-Adjuvant Chemotherapy   TCH 6 followed by Herceptin maintenance   12/11/2012 Surgery   Left breast lumpectomy: 1.8 cm tumor 1 positive sentinel node, axillary lymph node dissection 02/08/2013 showed 0/13 lymph nodes   03/25/2013 - 05/06/2013 Radiation Therapy   Adjuvant radiation therapy   06/05/2013 - 07/20/2017 Anti-estrogen oral therapy   Tamoxifen 20 mg daily   07/27/2017 Relapse/Recurrence   MRI Brain: 3.4 x 2.9 x 2.9 cm RIGHT frontal lobe mass with imaging characteristics of solitary metastasis. Extensive vasogenic edema resulting in 9 mm RIGHT to LEFT midline shift. Equivocal very early LEFT ventricle entrapment.    08/04/2017 Surgery   Rt frontal brain resection: Poorly differentiated tumor IHC suggests breast primary ER and PR Positive   08/25/2017 - 09/04/2017 Radiation Therapy   Stereotactic radiation   09/18/2017 -  Anti-estrogen oral therapy   Lapatinib with letrozole   12/18/2017 - 12/19/2017 Radiation Therapy   New right parietal lobe metastases status post [facility]   05/09/2019 Relapse/Recurrence   Interval increase in size of the enhancing nodular left internal mammary soft tissue 2.8 cm.  Redemonstrated enlarged supraclavicular, lower cervical and lower posterior cervical  nodes unchanged.  Interval increase in the bony erosion of the posterior and lateral left third rib, increasing soft tissue lesion eroding the left sternal body 3.1 cm was 2.5 cm.  Bronchiectatic changes   06/19/2019 - 05/05/2020 Chemotherapy   The patient had ado-trastuzumab emtansine (KADCYLA) 180 mg in sodium chloride 0.9 % 250 mL chemo infusion, 3.6 mg/kg = 180 mg, Intravenous, Once, 16 of 16 cycles Administration: 180 mg (06/19/2019), 180 mg (07/10/2019), 180 mg (09/17/2019), 180 mg (08/05/2019), 180 mg (08/27/2019), 180 mg (10/08/2019), 180 mg (10/29/2019), 180 mg (11/19/2019), 200 mg (12/10/2019), 200 mg (12/31/2019), 200 mg (01/20/2020), 200 mg (02/11/2020), 200 mg (03/03/2020), 200 mg (03/24/2020), 200 mg (04/14/2020), 200 mg (05/05/2020)  for chemotherapy treatment.    06/15/2020 -  Chemotherapy   The patient had trastuzumab-dkst (OGIVRI) 399 mg in sodium chloride 0.9 % 250 mL chemo infusion, 8 mg/kg = 399 mg, Intravenous,  Once, 0 of 5 cycles  for chemotherapy treatment.    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[facility])   06/19/2019 - 05/05/2020 Chemotherapy   The patient had ado-trastuzumab emtansine (KADCYLA) 180 mg in sodium chloride 0.9 % 250 mL chemo infusion, 3.6 mg/kg = 180 mg, Intravenous, Once, 16 of 16 cycles Administration: 180 mg (06/19/2019), 180 mg (07/10/2019), 180 mg (09/17/2019), 180 mg (08/05/2019), 180 mg (08/27/2019), 180 mg (10/08/2019), 180 mg (10/29/2019), 180 mg (11/19/2019), 200 mg (12/10/2019), 200 mg (12/31/2019), 200 mg (01/20/2020), 200 mg (02/11/2020), 200 mg (03/03/2020), 200 mg (03/24/2020), 200 mg (04/14/2020), 200 mg (05/05/2020)  for chemotherapy treatment.      CHIEF COMPLIANT: Follow-up of metastatic breast cancer  INTERVAL HISTORY: Melissa [name] is a 54 y.o. with above-mentioned history of metastatic breast cancer with brain metastasiss/presection who is currently ontreatment with Tucatinib with  Xeloda and Herceptin. Echo on 06/10/20 showed an ejection fraction of 60-65%.She presents to the clinic todayfortreatment.   ALLERGIES:  is allergic to aspirin, protonix [pantoprazole], and iodinated diagnostic agents.  MEDICATIONS:  Current Outpatient Medications  Medication Sig Dispense Refill  . albuterol (VENTOLIN HFA) 108 (90 Base) MCG/ACT inhaler Inhale 2 puffs into the lungs every 6 (six) hours as needed. 8 g 5  . budesonide-formoterol (SYMBICORT) 160-4.5 MCG/ACT inhaler Inhale 2 puffs into the lungs 2 (two) times daily. 3 Inhaler 3  . capecitabine (XELODA) 500 MG tablet Take 2 tablets (1,000 mg total) by mouth 2 (two) times daily after a meal. Take 2 tabs twice daily for 14 days on and 7 days off 56 tablet 3  . chlorpheniramine-HYDROcodone (TUSSIONEX) 10-8 MG/5ML SUER Take 5 mLs by mouth at bedtime. 473 mL 0  . levalbuterol (XOPENEX) 0.63 MG/3ML nebulizer solution Take 3 mLs (0.63 mg total) by nebulization every 4 (four) hours as needed for wheezing or shortness of breath. 540 mL 6  . naproxen sodium (ALEVE) 220 MG tablet Take 440 mg by mouth 2 (two) times daily as needed (headache).     Marland Kitchen omeprazole (PRILOSEC) 40 MG capsule Take 1 capsule (40 mg total) by mouth daily. 30 capsule 3  . predniSONE (DELTASONE) 50 MG tablet Take 1 tablet 13 hours, 7 hours, and 1 hour prior to scans. 6 tablet 0  . tucatinib (TUKYSA) 150 MG tablet Take 2 tablets (300 mg total) by mouth in the morning and at bedtime. Take every 12 hrs at the same time each day with or without a meal. 120 tablet 3  . voriconazole (VFEND) 200 MG tablet Take 1 tablet (200 mg total) by mouth 2 (two) times daily. 60 tablet 11   No current facility-administered medications for this visit.    PHYSICAL EXAMINATION: ECOG PERFORMANCE STATUS: 1 - Symptomatic but completely ambulatory  Vitals:   06/15/20 1058  BP: (!) 102/57  Pulse: 74  Resp: 18  Temp: (!) 97.3 F (36.3 C)  SpO2: 99%   There were no vitals filed for this  visit.   LABORATORY DATA:  I have reviewed the data as listed CMP Latest Ref Rng & Units 06/15/2020 05/05/2020 04/14/2020  Glucose 70 - 99 mg/dL 115(H) 103(H) 84  BUN 6 - 20 mg/dL _0 Creatinine 0.44 - 1.00 mg/dL 0.99 0.87 0.84  Sodium 135 - 145 mmol/L 139 137 138  Potassium 3.5 - 5.1 mmol/L 3.6 3.6 3.8  Chloride 98 - 111 mmol/L 107 104 102  CO2 22 - 32 mmol/L _1 Calcium 8.9 - 10.3 mg/dL 9.4 9.7 9.9  Total Protein 6.5 - 8.1 g/dL 7.5 8.4(H) 8.5(H)  Total Bilirubin 0.3 - 1.2 mg/dL <0.2(L) <0.2(L) <0.2(L)  Alkaline Phos 38 - 126 U/L 123 139(H) 146(H)  AST 15 - 41 U/L _2 ALT 0 - 44 U/L _3 Lab Results  Component Value Date   WBC 7.8 06/15/2020   HGB 8.7 (L) 06/15/2020   HCT 27.4 (L) 06/15/2020   MCV 90.1 06/15/2020   PLT 266 06/15/2020   NEUTROABS 5.6 06/15/2020    ASSESSMENT & PLAN:  Breast cancer of upper-inner quadrant of left female breast (  [place]) 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 Anti Her 2 therapy with Lapatinibstarted 09/17/2017-05/14/2019: Stopped for progression 3.I discontinuedtamoxifen and started her on letrozole 2.5 mg daily.05/14/2019 stopped for progression 4.Stereotactic radiosurgery 12/19/2017 to the new right parietal lobe metastases. 5.  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  Overall tolerating it very well.  Severe anemia: Asymptomatic we will watch.  Previously received blood transfusion Return to clinic every 3 weeks for Herceptin treatments.  I will see her in 6 weeks with labs and follow-up.   No orders of the defined types were placed in this encounter.  The patient has a good understanding of the overall plan. she agrees with it. she will call with any problems that may develop before the next visit here.  Total time spent: 30 mins including face to face time and time spent for planning, charting and coordination of care  Nicholas Lose, MD 06/15/2020  I, Cloyde Reams Dorshimer, am acting as scribe for Dr. Nicholas Lose.  I have reviewed the above documentation for accuracy and completeness, and I agree with the above.

## 2020-06-15 ENCOUNTER — Inpatient Hospital Stay: Payer: PRIVATE HEALTH INSURANCE

## 2020-06-15 ENCOUNTER — Ambulatory Visit: Payer: PRIVATE HEALTH INSURANCE

## 2020-06-15 ENCOUNTER — Other Ambulatory Visit: Payer: Self-pay

## 2020-06-15 ENCOUNTER — Other Ambulatory Visit: Payer: Self-pay | Admitting: *Deleted

## 2020-06-15 ENCOUNTER — Inpatient Hospital Stay: Payer: PRIVATE HEALTH INSURANCE | Attending: Medical | Admitting: Hematology and Oncology

## 2020-06-15 VITALS — BP 102/57 | HR 74 | Temp 97.3°F | Resp 18 | Ht 62.0 in

## 2020-06-15 DIAGNOSIS — Z5112 Encounter for antineoplastic immunotherapy: Secondary | ICD-10-CM | POA: Diagnosis not present

## 2020-06-15 DIAGNOSIS — C7931 Secondary malignant neoplasm of brain: Secondary | ICD-10-CM | POA: Insufficient documentation

## 2020-06-15 DIAGNOSIS — C7951 Secondary malignant neoplasm of bone: Secondary | ICD-10-CM | POA: Diagnosis not present

## 2020-06-15 DIAGNOSIS — C50212 Malignant neoplasm of upper-inner quadrant of left female breast: Secondary | ICD-10-CM | POA: Insufficient documentation

## 2020-06-15 DIAGNOSIS — Z5181 Encounter for therapeutic drug level monitoring: Secondary | ICD-10-CM

## 2020-06-15 DIAGNOSIS — Z171 Estrogen receptor negative status [ER-]: Secondary | ICD-10-CM | POA: Diagnosis not present

## 2020-06-15 DIAGNOSIS — C787 Secondary malignant neoplasm of liver and intrahepatic bile duct: Secondary | ICD-10-CM | POA: Diagnosis present

## 2020-06-15 DIAGNOSIS — Z79899 Other long term (current) drug therapy: Secondary | ICD-10-CM

## 2020-06-15 DIAGNOSIS — Z170 Estrogen receptor positive status [ER+]: Secondary | ICD-10-CM | POA: Insufficient documentation

## 2020-06-15 DIAGNOSIS — Z95828 Presence of other vascular implants and grafts: Secondary | ICD-10-CM

## 2020-06-15 DIAGNOSIS — C50912 Malignant neoplasm of unspecified site of left female breast: Secondary | ICD-10-CM

## 2020-06-15 LAB — CMP (CANCER CENTER ONLY)
ALT: 9 U/L (ref 0–44)
AST: 16 U/L (ref 15–41)
Albumin: 3 g/dL — ABNORMAL LOW (ref 3.5–5.0)
Alkaline Phosphatase: 123 U/L (ref 38–126)
Anion gap: 6 (ref 5–15)
BUN: 11 mg/dL (ref 6–20)
CO2: 26 mmol/L (ref 22–32)
Calcium: 9.4 mg/dL (ref 8.9–10.3)
Chloride: 107 mmol/L (ref 98–111)
Creatinine: 0.99 mg/dL (ref 0.44–1.00)
GFR, Estimated: >60 mL/min (ref >60)
Glucose, Bld: 115 mg/dL — ABNORMAL HIGH (ref 70–99)
Potassium: 3.6 mmol/L (ref 3.5–5.1)
Sodium: 139 mmol/L (ref 135–145)
Total Bilirubin: <0.2 mg/dL — ABNORMAL LOW (ref 0.3–1.2)
Total Protein: 7.5 g/dL (ref 6.5–8.1)

## 2020-06-15 LAB — CBC WITH DIFFERENTIAL (CANCER CENTER ONLY)
Abs Immature Granulocytes: 0.03 10*3/uL (ref 0.00–0.07)
Basophils Absolute: 0.1 10*3/uL (ref 0.0–0.1)
Basophils Relative: 1 %
Eosinophils Absolute: 0.3 10*3/uL (ref 0.0–0.5)
Eosinophils Relative: 4 %
HCT: 27.4 % — ABNORMAL LOW (ref 36.0–46.0)
Hemoglobin: 8.7 g/dL — ABNORMAL LOW (ref 12.0–15.0)
Immature Granulocytes: 0 %
Lymphocytes Relative: 18 %
Lymphs Abs: 1.4 10*3/uL (ref 0.7–4.0)
MCH: 28.6 pg (ref 26.0–34.0)
MCHC: 31.8 g/dL (ref 30.0–36.0)
MCV: 90.1 fL (ref 80.0–100.0)
Monocytes Absolute: 0.4 10*3/uL (ref 0.1–1.0)
Monocytes Relative: 5 %
Neutro Abs: 5.6 10*3/uL (ref 1.7–7.7)
Neutrophils Relative %: 72 %
Platelet Count: 266 10*3/uL (ref 150–400)
RBC: 3.04 MIL/uL — ABNORMAL LOW (ref 3.87–5.11)
RDW: 15.9 % — ABNORMAL HIGH (ref 11.5–15.5)
WBC Count: 7.8 10*3/uL (ref 4.0–10.5)
nRBC: 0 % (ref 0.0–0.2)

## 2020-06-15 MED ORDER — ACETAMINOPHEN 325 MG PO TABS
ORAL_TABLET | ORAL | Status: AC
  Filled 2020-06-15: qty 2

## 2020-06-15 MED ORDER — HEPARIN SOD (PORK) LOCK FLUSH 100 UNIT/ML IV SOLN
500.0000 [IU] | Freq: Once | INTRAVENOUS | Status: AC | PRN
  Administered 2020-06-15: 500 [IU]
  Filled 2020-06-15: qty 5

## 2020-06-15 MED ORDER — HEPARIN SOD (PORK) LOCK FLUSH 100 UNIT/ML IV SOLN
500.0000 [IU] | Freq: Once | INTRAVENOUS | Status: DC | PRN
  Filled 2020-06-15: qty 5

## 2020-06-15 MED ORDER — DIPHENHYDRAMINE HCL 25 MG PO CAPS: 50.0000 mg | ORAL_CAPSULE | Freq: Once | ORAL | Status: DC

## 2020-06-15 MED ORDER — DIPHENHYDRAMINE HCL 25 MG PO CAPS
25.0000 mg | ORAL_CAPSULE | Freq: Once | ORAL | Status: AC
  Administered 2020-06-15: 25 mg via ORAL

## 2020-06-15 MED ORDER — ACETAMINOPHEN 325 MG PO TABS
650.0000 mg | ORAL_TABLET | Freq: Once | ORAL | Status: AC
  Administered 2020-06-15: 650 mg via ORAL

## 2020-06-15 MED ORDER — TRASTUZUMAB-DKST CHEMO 150 MG IV SOLR
8.0000 mg/kg | Freq: Once | INTRAVENOUS | Status: AC
  Administered 2020-06-15: 399 mg via INTRAVENOUS
  Filled 2020-06-15: qty 19

## 2020-06-15 MED ORDER — SODIUM CHLORIDE 0.9% FLUSH
10.0000 mL | INTRAVENOUS | Status: DC | PRN
  Administered 2020-06-15: 10 mL
  Filled 2020-06-15: qty 10

## 2020-06-15 MED ORDER — DIPHENHYDRAMINE HCL 25 MG PO CAPS
ORAL_CAPSULE | ORAL | Status: AC
  Filled 2020-06-15: qty 2

## 2020-06-15 MED ORDER — SODIUM CHLORIDE 0.9 % IV SOLN
Freq: Once | INTRAVENOUS | Status: AC
  Filled 2020-06-15: qty 250

## 2020-06-15 NOTE — Assessment & Plan Note (Signed)
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 Anti Her 2 therapy with Lapatinibstarted 09/17/2017-05/14/2019: Stopped for progression 3.I discontinuedtamoxifen and started her on letrozole 2.5 mg daily.05/14/2019 stopped for progression 4.Stereotactic radiosurgery 12/19/2017 to the new right parietal lobe metastases. 5.  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

## 2020-06-15 NOTE — Patient Instructions (Addendum)
[facility] Discharge Instructions for Patients Receiving Chemotherapy  Today you received the following immunotherapy agent: Trastuzumab-dkst (OGIVRI)  To help prevent nausea and vomiting after your treatment, we encourage you to take your nausea medication as directed by your MD.   If you develop nausea and vomiting that is not controlled by your nausea medication, call the clinic.   BELOW ARE SYMPTOMS THAT SHOULD BE REPORTED IMMEDIATELY:  *FEVER GREATER THAN 100.5 F  *CHILLS WITH OR WITHOUT FEVER  NAUSEA AND VOMITING THAT IS NOT CONTROLLED WITH YOUR NAUSEA MEDICATION  *UNUSUAL SHORTNESS OF BREATH  *UNUSUAL BRUISING OR BLEEDING  TENDERNESS IN MOUTH AND THROAT WITH OR WITHOUT PRESENCE OF ULCERS  *URINARY PROBLEMS  *BOWEL PROBLEMS  UNUSUAL RASH Items with * indicate a potential emergency and should be followed up as soon as possible.  Feel free to call the clinic should you have any questions or concerns. The clinic phone number is [phone].  Please show the [place] at check-in to the Emergency Department and triage nurse.  Trastuzumab injection for infusion What is this medicine? TRASTUZUMAB (tras TOO zoo mab) is a monoclonal antibody. It is used to treat breast cancer and stomach cancer. This medicine may be used for other purposes; ask your health care provider or pharmacist if you have questions. COMMON BRAND NAME(S): Herceptin, Galvin Proffer, Trazimera What should I tell my health care provider before I take this medicine? They need to know if you have any of these conditions:  heart disease  heart failure  lung or breathing disease, like asthma  an unusual or allergic reaction to trastuzumab, benzyl alcohol, or other medications, foods, dyes, or preservatives  pregnant or trying to get pregnant  breast-feeding How should I use this medicine? This drug is given as an infusion into a vein. It is administered  in a hospital or clinic by a specially trained health care professional. Talk to your pediatrician regarding the use of this medicine in children. This medicine is not approved for use in children. Overdosage: If you think you have taken too much of this medicine contact a poison control center or emergency room at once. NOTE: This medicine is only for you. Do not share this medicine with others. What if I miss a dose? It is important not to miss a dose. Call your doctor or health care professional if you are unable to keep an appointment. What may interact with this medicine? This medicine may interact with the following medications:  certain types of chemotherapy, such as daunorubicin, doxorubicin, epirubicin, and idarubicin This list may not describe all possible interactions. Give your health care provider a list of all the medicines, herbs, non-prescription drugs, or dietary supplements you use. Also tell them if you smoke, drink alcohol, or use illegal drugs. Some items may interact with your medicine. What should I watch for while using this medicine? Visit your doctor for checks on your progress. Report any side effects. Continue your course of treatment even though you feel ill unless your doctor tells you to stop. Call your doctor or health care professional for advice if you get a fever, chills or sore throat, or other symptoms of a cold or flu. Do not treat yourself. Try to avoid being around people who are sick. You may experience fever, chills and shaking during your first infusion. These effects are usually mild and can be treated with other medicines. Report any side effects during the infusion to your health care  professional. Fever and chills usually do not happen with later infusions. Do not become pregnant while taking this medicine or for 7 months after stopping it. Women should inform their doctor if they wish to become pregnant or think they might be pregnant. Women of  child-bearing potential will need to have a negative pregnancy test before starting this medicine. There is a potential for serious side effects to an unborn child. Talk to your health care professional or pharmacist for more information. Do not breast-feed an infant while taking this medicine or for 7 months after stopping it. Women must use effective birth control with this medicine. What side effects may I notice from receiving this medicine? Side effects that you should report to your doctor or health care professional as soon as possible:  allergic reactions like skin rash, itching or hives, swelling of the face, lips, or tongue  chest pain or palpitations  cough  dizziness  feeling faint or lightheaded, falls  fever  general ill feeling or flu-like symptoms  signs of worsening heart failure like breathing problems; swelling in your legs and feet  unusually weak or tired Side effects that usually do not require medical attention (report to your doctor or health care professional if they continue or are bothersome):  bone pain  changes in taste  diarrhea  joint pain  nausea/vomiting  weight loss This list may not describe all possible side effects. Call your doctor for medical advice about side effects. You may report side effects to FDA at 1-800-FDA-1088. Where should I keep my medicine? This drug is given in a hospital or clinic and will not be stored at home. NOTE: This sheet is a summary. It may not cover all possible information. If you have questions about this medicine, talk to your doctor, pharmacist, or health care provider.  2020 Elsevier/Gold Standard (2016-06-21 14:37:52)

## 2020-06-15 NOTE — Progress Notes (Signed)
Patient prefers Benadryl 25mg  as "50mg  makes her climb the walls".  Benadryl orders changed to 25mg  po per patient request.  Hardie Pulley, PharmD, BCPS, BCOP

## 2020-06-15 NOTE — Progress Notes (Signed)
Pt. stable for discharge. Left via ambulation, no respiratory distress noted.,

## 2020-06-15 NOTE — Patient Instructions (Signed)

## 2020-06-18 ENCOUNTER — Telehealth: Payer: Self-pay | Admitting: Hematology and Oncology

## 2020-06-18 NOTE — Telephone Encounter (Signed)
Scheduled per 12/6 los. Called and spoke with pt, confirmed 12/27 appt

## 2020-06-22 ENCOUNTER — Telehealth: Payer: Self-pay

## 2020-06-22 ENCOUNTER — Other Ambulatory Visit: Payer: PRIVATE HEALTH INSURANCE

## 2020-06-22 ENCOUNTER — Encounter: Payer: Self-pay | Admitting: Urology

## 2020-06-22 ENCOUNTER — Other Ambulatory Visit: Payer: Self-pay

## 2020-06-22 DIAGNOSIS — B449 Aspergillosis, unspecified: Secondary | ICD-10-CM

## 2020-06-22 NOTE — Telephone Encounter (Signed)
Spoke with patient in regards to 1 month telephone visit with Freeman Caldron PA on 06/25/20 @ 2pm. Patient verbalized understanding of appointment time. Reviewed meaningful use questions. TM

## 2020-06-23 ENCOUNTER — Other Ambulatory Visit: Payer: Self-pay | Admitting: Radiation Therapy

## 2020-06-23 ENCOUNTER — Telehealth: Payer: Self-pay | Admitting: Radiation Therapy

## 2020-06-23 NOTE — Telephone Encounter (Signed)
Called to check in with Melissa [name], it has been just over a month since her recent brain [place] treatment to four targets on 05/13/20. Melissa [name] is doing very well overall. She is adjusting to her new systemic treatment with some nausea and fatigue, this is managed by taking her medication on schedule with meals. She had a mild headache and fatigue following her SRS treatment, but this is not persistent and goes away quickly after taking OTC medication such as Aleve or Tylenol. She reports that her energy levels have improved, she was working when I called to check in on her.         I shared that we will plan to do another brain MRI in early February, this scan will need to be done at [facility] per her insurance provider. Melissa [name] is pleased with this and had no other issues or questions to discuss. I will call her again closer to the time her MRI is due. Melissa [name] has my contact information if she needs anything before then.   Mont Dutton R.T.(R)(T) Radiation Special Procedures Navigator

## 2020-06-24 ENCOUNTER — Other Ambulatory Visit: Payer: Self-pay | Admitting: Pulmonary Disease

## 2020-06-24 DIAGNOSIS — K219 Gastro-esophageal reflux disease without esophagitis: Secondary | ICD-10-CM

## 2020-06-25 ENCOUNTER — Inpatient Hospital Stay
Admission: RE | Admit: 2020-06-25 | Discharge: 2020-06-25 | Disposition: A | Discharge status: Home / Self Care | Payer: Self-pay | Source: Ambulatory Visit | Attending: Urology | Admitting: Urology

## 2020-06-25 DIAGNOSIS — C7931 Secondary malignant neoplasm of brain: Secondary | ICD-10-CM

## 2020-06-26 LAB — VORICONAZOLE QUANT BY LC/MS: Voriconazole, Quant, by LC/MS: 2.8 ug/mL

## 2020-06-26 NOTE — Progress Notes (Signed)
Good.

## 2020-06-30 ENCOUNTER — Other Ambulatory Visit: Payer: Self-pay | Admitting: Radiation Therapy

## 2020-06-30 DIAGNOSIS — C7931 Secondary malignant neoplasm of brain: Secondary | ICD-10-CM

## 2020-06-30 MED FILL — CAPECITABINE 500 MG TABLET: 500 | 21 days supply | Qty: 56 | Fill #1

## 2020-07-01 ENCOUNTER — Encounter: Payer: Self-pay | Admitting: Infectious Disease

## 2020-07-01 ENCOUNTER — Ambulatory Visit (INDEPENDENT_AMBULATORY_CARE_PROVIDER_SITE_OTHER): Payer: PRIVATE HEALTH INSURANCE | Admitting: Infectious Disease

## 2020-07-01 ENCOUNTER — Other Ambulatory Visit: Payer: Self-pay

## 2020-07-01 VITALS — BP 111/75 | HR 77 | Temp 98.6°F | Resp 100 | Ht 62.0 in | Wt 108.0 lb

## 2020-07-01 DIAGNOSIS — B479 Mycetoma, unspecified: Secondary | ICD-10-CM | POA: Diagnosis not present

## 2020-07-01 DIAGNOSIS — Z171 Estrogen receptor negative status [ER-]: Secondary | ICD-10-CM

## 2020-07-01 DIAGNOSIS — J438 Other emphysema: Secondary | ICD-10-CM

## 2020-07-01 DIAGNOSIS — Z923 Personal history of irradiation: Secondary | ICD-10-CM

## 2020-07-01 DIAGNOSIS — C50212 Malignant neoplasm of upper-inner quadrant of left female breast: Secondary | ICD-10-CM | POA: Diagnosis not present

## 2020-07-01 DIAGNOSIS — B449 Aspergillosis, unspecified: Secondary | ICD-10-CM | POA: Diagnosis not present

## 2020-07-01 NOTE — Progress Notes (Signed)
Subjective:  Complaint: Coughing less now  Patient ID: Melissa [name], female    DOB: [birth year], 54 y.o.   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  Along the way she was diagnosed with breast cancer 2013 underwent lumpectomy and chemotherapy.  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  She is following with Dr. Lindi Adie with oncology.  She is subsequent been found to have other metastases including to her sternum and liver.  She developed cavitary lung infection and underwent bronchoscopy which revealed Aspergillus fumigatus.  He has been on Cresemba since then.  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  She also has some spiculated lesions in the posterior left lobe lobe of her lung.  Radiology is concerned that the apparent mycetoma is due to Aspergillus or another mold.  There was similar concerned that the spiculated areas could represent Aspergillus though we have no culture data to guide [name].  She continued on Cresemba at that point  She then had bronchoscopy with BAL.  Fungal cultures from the bronchoscopy have been negative though the patient was on Cresemba at the time.  There is 1 AFB culture that is positive but not for tuberculosis.  Since then apparently (and I had to go to [employer] on web (not available on epic) they stated that Mycobacteria could NOT grow on subculture so no ID  In the interim the patient's insurance had denied Cresemba and she has been changed to voriconazole.  Unfortunately she has been found to have  metastases of her breast cancer to the brain.  She  underwent stereotactic radiation to these lesions by Dr. London Pepper yesterday.  Her chemotherapeutic regimen includes Tukysa (tucatinib) which can elevate voriconazole levels and potentially cause voriconazole toxicity including QT prolongation.  We got voriconazole level  In November which was 0.9  EKG showed:  her EKG showed a QTC of 448 with a QTC of 465 ms last EKG that I could find in the computer that I could actually read was in 2019 in which she had a QTC of 442.   She does tell us that she is coughing less since being on voriconazole.   .  She reiterated again that her cough is dramatically better since being on work voriconazole and she finds it to be more effective than when she was taking [place].  Particular she hardly ever has hemoptysis.  Since getting back on chemotherapeutic drugs that raise level of voriconazole we checked her voriconazole level again and it was now at 2.8 in the therapeutic range she does have some blurry vision which could be partly due to voriconazole but no other side effects that I can see that would be coming from it.     Past Medical History:  Diagnosis Date  . Anemia   . Arthritis    knees and hips  . 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 Asthma   . Breast cancer ([place])   . Bronchiectasis ([place])   . Bronchiolitis   . Cancer [facility])    breast cancer 2014  .  Cancer, metastatic to liver ([place])    2021  . Complication of anesthesia    bp dropped + desat   . COPD (chronic obstructive pulmonary disease) ([place])   . Dyspnea    DOE  . GERD (gastroesophageal reflux disease)   . H/O coccidioidomycosis    was reason for lung lobectomy  . Headache(784.0)    due to eye strain or not eating  . History of anemia    no current problem  . History of asthma    as a child  . History of breast cancer 2014   left  . History of chemotherapy    finished 07/17/2013  . History of hiatal hernia    AGE 54   . Hx of radiation therapy 03/25/13-05/06/13   left breast 5000 cGy/25 sessions, left breast boost 1000 cGy/5 sessions  . 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 Pneumonia    LAST FLARE UP 01/2018  . Runny nose 07/30/2013   clear drainage  . Wears dentures    upper    Past Surgical History:  Procedure Laterality Date  . APPLICATION OF CRANIAL NAVIGATION N/A 08/04/2017   Procedure: APPLICATION OF CRANIAL NAVIGATION;  Surgeon: [name], [name], MD;  Location: [place];  Service: Neurosurgery;  Laterality: N/A;  . AXILLARY LYMPH NODE DISSECTION Left 02/08/2013   Procedure: LEFT AXILLARY DISSECTION;  Surgeon: Edward Jolly, MD;  Location: [place];  Service: General;  Laterality: Left;  . BREAST CYST EXCISION Right 2006  . BREAST LUMPECTOMY Left 2014  . BREAST LUMPECTOMY WITH NEEDLE LOCALIZATION AND AXILLARY SENTINEL LYMPH NODE BX Left 12/31/2012   Procedure: NEEDLE LOCALIZATION LEFT BREAST LUMPECTOMY AND LEFT AXILLARY SENTENIAL LYMPH NODE BX;  Surgeon: Edward Jolly, MD;  Location: [place];  Service: General;  Laterality: Left;  . BRONCHIAL BRUSHINGS  03/17/2020   Procedure: BRONCHIAL BRUSHINGS;  Surgeon: Garner Nash, DO;  Location: [place] [facility];  Service: Pulmonary;;  . BRONCHIAL WASHINGS  03/17/2020   Procedure: BRONCHIAL WASHINGS;  Surgeon: Garner Nash, DO;  Location: [place] [facility];  Service: Pulmonary;;  . CESAREAN SECTION  1995/1996  . CRANIOTOMY Right 08/04/2017   Procedure: Right Frontal craniotomy for resection of tumor with stereotactic navigation;  Surgeon: [name], [name], MD;  Location: [place];  Service: Neurosurgery;  Laterality: Right;  Right Frontal craniotomy for resection of tumor with stereotactic navigation  . IR IMAGING GUIDED PORT INSERTION  05/31/2019  . LUNG LOBECTOMY Left 05/1996   upper lobe - due to [facility] Fever  . PORT-A-CATH REMOVAL Right 08/02/2013   Procedure: REMOVAL PORT-A-CATH;  Surgeon: Edward Jolly, MD;  Location:  [place];  Service: General;  Laterality: Right;  . PORTACATH PLACEMENT  07/02/2012   Procedure: INSERTION PORT-A-CATH;  Surgeon: Edward Jolly, MD;  Location: [place];  Service: General;  Laterality: N/A;  right  . VIDEO BRONCHOSCOPY N/A 03/17/2020   Procedure: VIDEO BRONCHOSCOPY;  Surgeon: Garner Nash, DO;  Location: [place] [facility];  Service: Pulmonary;  Laterality: N/A;  . VIDEO BRONCHOSCOPY WITH ENDOBRONCHIAL ULTRASOUND N/A 05/23/2018   Procedure: VIDEO BRONCHOSCOPY WITH ENDOBRONCHIAL ULTRASOUND;  Surgeon: Garner Nash, DO;  Location: [facility];  Service: Thoracic;  Laterality: N/A;    Family History  Problem Relation Age of Onset  . Emphysema Mother        was a smoker  . Heart disease Mother   . Melanoma Mother        dx in her 25s  .  Breast cancer Mother 29  . Asthma Brother   . Breast cancer Cousin        mother's maternal cousin; dx in her 9s      Social History   Socioeconomic History  . Marital status: Married    Spouse name: Not on file  . Number of children: 2  . Years of education: Not on file  . Highest education level: Not on file  Occupational History  . Occupation: Secondary school teacher  Tobacco Use  . Smoking status: Current Every Day Smoker    Packs/day: 1.00    Years: 38.00    Pack years: 38.00    Types: Cigarettes  . Smokeless tobacco: Never Used  . Tobacco comment: 04/20/20 less than 0.5ppd as of 03/05/20  Vaping Use  . Vaping Use: Never used  Substance and Sexual Activity  . Alcohol use: No  . Drug use: No  . Sexual activity: Not Currently  Other Topics Concern  . Not on file  Social History Narrative  . Not on file   Social Determinants of Health   Financial Resource Strain: Not on file  Food Insecurity: Not on file  Transportation Needs: Not on file  Physical Activity: Not on file  Stress: Not on file  Social Connections: Not on file    Allergies  Allergen Reactions  . Aspirin Anaphylaxis  and Shortness Of Breath    THROAT CLOSES  . Protonix [Pantoprazole] Nausea Only and Other (See Comments)    Also caused a "film in the mouth" and caused chest pressure  . Iodinated Diagnostic Agents Rash    Patient allergic to contrast used in radiation oncology for CT simulation      Current Outpatient Medications:  .  albuterol (VENTOLIN HFA) 108 (90 Base) MCG/ACT inhaler, Inhale 2 puffs into the lungs every 6 (six) hours as needed., Disp: 8 g, Rfl: 5 .  budesonide-formoterol (SYMBICORT) 160-4.5 MCG/ACT inhaler, Inhale 2 puffs into the lungs 2 (two) times daily., Disp: 3 Inhaler, Rfl: 3 .  capecitabine (XELODA) 500 MG tablet, Take 2 tablets (1,000 mg total) by mouth 2 (two) times daily after a meal. Take 2 tabs twice daily for 14 days on and 7 days off, Disp: 56 tablet, Rfl: 3 .  chlorpheniramine-HYDROcodone (TUSSIONEX) 10-8 MG/5ML SUER, Take 5 mLs by mouth at bedtime., Disp: 473 mL, Rfl: 0 .  levalbuterol (XOPENEX) 0.63 MG/3ML nebulizer solution, Take 3 mLs (0.63 mg total) by nebulization every 4 (four) hours as needed for wheezing or shortness of breath., Disp: 540 mL, Rfl: 6 .  naproxen sodium (ALEVE) 220 MG tablet, Take 440 mg by mouth 2 (two) times daily as needed (headache). , Disp: , Rfl:  .  omeprazole (PRILOSEC) 40 MG capsule, TAKE 1 CAPSULE(40 MG) BY MOUTH DAILY, Disp: 30 capsule, Rfl: 3 .  predniSONE (DELTASONE) 50 MG tablet, Take 1 tablet 13 hours, 7 hours, and 1 hour prior to scans., Disp: 6 tablet, Rfl: 0 .  tucatinib (TUKYSA) 150 MG tablet, Take 2 tablets (300 mg total) by mouth in the morning and at bedtime. Take every 12 hrs at the same time each day with or without a meal., Disp: 120 tablet, Rfl: 3 .  voriconazole (VFEND) 200 MG tablet, Take 1 tablet (200 mg total) by mouth 2 (two) times daily., Disp: 60 tablet, Rfl: 11   Review of Systems  Constitutional: Negative for activity change, appetite change, chills, diaphoresis, fatigue, fever and unexpected weight change.   HENT: Negative for congestion, rhinorrhea, sinus  pressure, sneezing and trouble swallowing.   Eyes: Negative for photophobia and visual disturbance.  Respiratory: Positive for cough. Negative for chest tightness, shortness of breath, wheezing and stridor.   Cardiovascular: Negative for chest pain, palpitations and leg swelling.  Gastrointestinal: Negative for abdominal distention, abdominal pain, anal bleeding, blood in stool, constipation, diarrhea, nausea and vomiting.  Genitourinary: Negative for difficulty urinating, dysuria, flank pain and hematuria.  Musculoskeletal: Negative for arthralgias, back pain, gait problem, joint swelling and myalgias.  Skin: Negative for color change, pallor, rash and wound.  Neurological: Negative for dizziness, tremors, facial asymmetry, weakness and light-headedness.  Hematological: Negative for adenopathy. Does not bruise/bleed easily.  Psychiatric/Behavioral: Negative for agitation, behavioral problems, confusion, decreased concentration, dysphoric mood and sleep disturbance.       Objective:   Physical Exam Constitutional:      General: She is not in acute distress.    Appearance: Normal appearance. She is well-developed. She is not ill-appearing or diaphoretic.  HENT:     Head: Normocephalic and atraumatic.     Right Ear: Hearing and external ear normal.     Left Ear: Hearing and external ear normal.     Nose: No nasal deformity or rhinorrhea.  Eyes:     General: No scleral icterus.    Conjunctiva/sclera: Conjunctivae normal.     Right eye: Right conjunctiva is not injected.     Left eye: Left conjunctiva is not injected.  Neck:     Vascular: No JVD.  Cardiovascular:     Rate and Rhythm: Normal rate and regular rhythm.     Heart sounds: S1 normal and S2 normal.  Pulmonary:     Effort: Prolonged expiration present. No respiratory distress.     Breath sounds: No stridor. Wheezing and rhonchi present.  Abdominal:     General: There is no  distension.     Palpations: Abdomen is soft.  Musculoskeletal:        General: Normal range of motion.     Right shoulder: Normal.     Left shoulder: Normal.     Cervical back: Normal range of motion and neck supple.     Right hip: Normal.     Left hip: Normal.     Right knee: Normal.     Left knee: Normal.  Lymphadenopathy:     Head:     Right side of head: No submandibular, preauricular or posterior auricular adenopathy.     Left side of head: No submandibular, preauricular or posterior auricular adenopathy.     Cervical: No cervical adenopathy.     Right cervical: No superficial or deep cervical adenopathy.    Left cervical: No superficial or deep cervical adenopathy.  Skin:    General: Skin is warm and dry.     Coloration: Skin is not pale.     Findings: No abrasion, bruising, ecchymosis, erythema, lesion or rash.     Nails: There is no clubbing.  Neurological:     Mental Status: She is alert and oriented to person, place, and time.     Sensory: No sensory deficit.     Coordination: Coordination normal.     Gait: Gait normal.  Psychiatric:        Attention and Perception: Attention and perception normal. She is attentive.        Mood and Affect: Mood and affect normal.        Speech: Speech normal.        Behavior: Behavior normal. Behavior is cooperative.  Thought Content: Thought content normal.        Cognition and Memory: Cognition and memory normal.        Judgment: Judgment normal.           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  The mycobacterial species never grew on culture.  If significance is not clear at all.  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    History of coccidiomycosis: She is still very worried about this but I have more anxiety about her Aspergillus  Metastatic breast cancer: following with Dr Lindi Adie

## 2020-07-03 ENCOUNTER — Encounter: Payer: Self-pay | Admitting: Hematology and Oncology

## 2020-07-06 ENCOUNTER — Inpatient Hospital Stay: Payer: PRIVATE HEALTH INSURANCE

## 2020-07-06 ENCOUNTER — Encounter: Payer: Self-pay | Admitting: *Deleted

## 2020-07-06 ENCOUNTER — Other Ambulatory Visit: Payer: Self-pay

## 2020-07-06 VITALS — BP 120/59 | HR 72 | Temp 98.1°F | Resp 18 | Wt 107.8 lb

## 2020-07-06 DIAGNOSIS — Z171 Estrogen receptor negative status [ER-]: Secondary | ICD-10-CM

## 2020-07-06 DIAGNOSIS — Z5112 Encounter for antineoplastic immunotherapy: Secondary | ICD-10-CM | POA: Diagnosis not present

## 2020-07-06 MED ORDER — DIPHENHYDRAMINE HCL 25 MG PO CAPS
ORAL_CAPSULE | ORAL | Status: AC
  Filled 2020-07-06: qty 2

## 2020-07-06 MED ORDER — SODIUM CHLORIDE 0.9% FLUSH
10.0000 mL | INTRAVENOUS | Status: DC | PRN
  Administered 2020-07-06: 10:00:00 10 mL
  Filled 2020-07-06: qty 10

## 2020-07-06 MED ORDER — HEPARIN SOD (PORK) LOCK FLUSH 100 UNIT/ML IV SOLN
500.0000 [IU] | Freq: Once | INTRAVENOUS | Status: AC | PRN
  Administered 2020-07-06: 10:00:00 500 [IU]
  Filled 2020-07-06: qty 5

## 2020-07-06 MED ORDER — SODIUM CHLORIDE 0.9 % IV SOLN
Freq: Once | INTRAVENOUS | Status: AC
  Filled 2020-07-06: qty 250

## 2020-07-06 MED ORDER — ACETAMINOPHEN 325 MG PO TABS
650.0000 mg | ORAL_TABLET | Freq: Once | ORAL | Status: AC
  Administered 2020-07-06: 08:00:00 650 mg via ORAL

## 2020-07-06 MED ORDER — ACETAMINOPHEN 325 MG PO TABS
ORAL_TABLET | ORAL | Status: AC
  Filled 2020-07-06: qty 2

## 2020-07-06 MED ORDER — TRASTUZUMAB-DKST CHEMO 150 MG IV SOLR
300.0000 mg | Freq: Once | INTRAVENOUS | Status: AC
  Administered 2020-07-06: 300 mg via INTRAVENOUS
  Filled 2020-07-06: qty 14.29

## 2020-07-06 MED ORDER — DIPHENHYDRAMINE HCL 25 MG PO CAPS
25.0000 mg | ORAL_CAPSULE | Freq: Once | ORAL | Status: AC
  Administered 2020-07-06: 08:00:00 25 mg via ORAL

## 2020-07-06 NOTE — Patient Instructions (Signed)
[facility] Discharge Instructions for Patients Receiving Chemotherapy  Today you received the following chemotherapy agents trastuzumab.  To help prevent nausea and vomiting after your treatment, we encourage you to take your nausea medication as directed.    If you develop nausea and vomiting that is not controlled by your nausea medication, call the clinic.   BELOW ARE SYMPTOMS THAT SHOULD BE REPORTED IMMEDIATELY:  *FEVER GREATER THAN 100.5 F  *CHILLS WITH OR WITHOUT FEVER  NAUSEA AND VOMITING THAT IS NOT CONTROLLED WITH YOUR NAUSEA MEDICATION  *UNUSUAL SHORTNESS OF BREATH  *UNUSUAL BRUISING OR BLEEDING  TENDERNESS IN MOUTH AND THROAT WITH OR WITHOUT PRESENCE OF ULCERS  *URINARY PROBLEMS  *BOWEL PROBLEMS  UNUSUAL RASH Items with * indicate a potential emergency and should be followed up as soon as possible.  Feel free to call the clinic should you have any questions or concerns. The clinic phone number is [phone].  Please show the CHEMO ALERT CARD at check-in to the Emergency Department and triage nurse.   

## 2020-07-08 ENCOUNTER — Telehealth: Payer: Self-pay

## 2020-07-08 NOTE — Telephone Encounter (Signed)
Oral Oncology Patient Advocate Encounter  Met patient in lobby to complete application for [place] in an effort to reduce patient's out of pocket expense for Xeloda to $0.    Application completed and faxed to [phone].   [employer] patient assistance phone number for follow up is [phone].   This encounter will be updated until final determination.    [place] Patient [place] Phone [phone] Fax [phone] 07/08/2020 9:12 AM

## 2020-07-13 ENCOUNTER — Telehealth: Payer: Self-pay

## 2020-07-13 MED FILL — TUKYSA 150 MG TABS: 150 | 30 days supply | Qty: 120 | Fill #1

## 2020-07-13 NOTE — Telephone Encounter (Signed)
Obtained a [place] copay card from [employer] making patient's copay $5/fill. Max limit of $25000. Once limit is reached, will have to apply for free drug program from [place].  Cruzita Lederer CPHT [facility] Patient Advocate [facility] Phone [phone] Fax [phone] 07/13/2020 11:22 AM

## 2020-07-16 NOTE — Telephone Encounter (Signed)
Oral Oncology Pharmacist Encounter   Appeal letter for branded Xeloda faxed to [facility] department.  Appeal faxed to: [phone]    Oral Oncology Clinic will continue to follow.    Lenord Carbo, PharmD, BCPS Hematology/Oncology Clinical Pharmacist [facility] Oral Chemotherapy Navigation Clinic [phone] 07/16/2020 8:20 AM

## 2020-07-22 NOTE — Progress Notes (Signed)
  Radiation Oncology         [phone] ________________________________  Name: Melissa [name] MRN: [id number]  Date: 05/13/2020  DOB: 22-May-1966  End of Treatment Note  Diagnosis:   55 y.o. woman with four brain metastases secondary to Stage IV, triple positive, invasive ductal carcinoma of the left breast.     Indication for treatment:  Palliation       Radiation treatment dates:   05/13/20  Site/dose/beams/energy:    [name] received stereotactic radiosurgery to the following targets: Four targets were treated with a single isocenter:  PTV6 Mid Cerebellum 47mm PTV7 Lt Occipital 1mm PTV8 Lt Occipital 48mm PTV9 Lt Temporal 20mm   These were treated using 6 Rapid Arc VMAT Beams to a prescription dose of 20 Gy.  ExacTrac registration was performed for each couch angle.  The 100% isodose line was prescribed with 120.9% hotspot.  6 MV X-rays were delivered in the flattening filter free beam mode.  Narrative: The patient tolerated radiation treatment relatively well.     Plan: The patient has completed radiation treatment. The patient will return to radiation oncology clinic for routine followup in one month. I advised her to call or return sooner if she has any questions or concerns related to her recovery or treatment. ________________________________  Sheral Apley. Tammi Klippel, M.D.

## 2020-07-24 MED FILL — CAPECITABINE 500 MG TABLET: 500 | 21 days supply | Qty: 56 | Fill #1

## 2020-07-27 NOTE — Assessment & Plan Note (Signed)
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 Anti Her 2 therapy with Lapatinibstarted 09/17/2017-05/14/2019: Stopped for progression 3.I discontinuedtamoxifen and started her on letrozole 2.5 mg daily.05/14/2019 stopped for progression 4.Stereotactic radiosurgery 12/19/2017 to the new right parietal lobe metastases. 5.  K[place]la: Received 16 cycles discontinued 05/05/2020 -------------------------------------------------------------------------------------------------------------------- Liver Biopsy 06/05/19: Metastatic cancer, ER/PR: 0%, Her 2: 3+ Positive, Ki 67: 20% Patient hadmetastases to liver, bone, brain, and questionably lung  Bone metastases: Because of dental issues bisphosphonates were not started  New brain metastases: Brain MRI 05/01/2020: Bilateral occipital, left temporal and cerebellarmets Lung aspergillus infection: Following with pulmonary and infectious disease.AFB positive, being treated with voriconazole ------------------------------------------------------------------------------------------------------------- Current treatment: Xeloda, Herceptin, Tucatinib started 06/11/2020 Adverse effects: Denies any diarrhea or nausea or vomiting.  Overall tolerating it very well.  Severe anemia: Asymptomatic we will watch.  Previously received blood transfusion Return to clinic every 3 weeks for Herceptin treatments.  I will see her in 6 weeks with labs and follow-up.

## 2020-07-27 NOTE — Progress Notes (Signed)
Patient Care Team: Patient, [name] as PCP - General (General Practice) [name], MD as Referring Physician (Emergency Medicine)  DIAGNOSIS:    ICD-10-CM   1. Metastasis to brain [facility])  C79.31   2. 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 Ki-67 was 70% HER-2 was amplified with a ratio 2.91   07/12/2012 - 07/17/2013 Neo-Adjuvant Chemotherapy   TCH 6 followed by Herceptin maintenance   12/11/2012 Surgery   Left breast lumpectomy: 1.8 cm tumor 1 positive sentinel node, axillary lymph node dissection 02/08/2013 showed 0/13 lymph nodes   03/25/2013 - 05/06/2013 Radiation Therapy   Adjuvant radiation therapy   06/05/2013 - 07/20/2017 Anti-estrogen oral therapy   Tamoxifen 20 mg daily   07/27/2017 Relapse/Recurrence   MRI Brain: 3.4 x 2.9 x 2.9 cm RIGHT frontal lobe mass with imaging characteristics of solitary metastasis. Extensive vasogenic edema resulting in 9 mm RIGHT to LEFT midline shift. Equivocal very early LEFT ventricle entrapment.    08/04/2017 Surgery   Rt frontal brain resection: Poorly differentiated tumor IHC suggests breast primary ER and PR Positive   08/25/2017 - 09/04/2017 Radiation Therapy   Stereotactic radiation   09/18/2017 -  Anti-estrogen oral therapy   Lapatinib with letrozole   12/18/2017 - 12/19/2017 Radiation Therapy   New right parietal lobe metastases status post [facility]   05/09/2019 Relapse/Recurrence   Interval increase in size of the enhancing nodular left internal mammary soft tissue 2.8 cm.  Redemonstrated enlarged supraclavicular, lower  cervical and lower posterior cervical nodes unchanged.  Interval increase in the bony erosion of the posterior and lateral left third rib, increasing soft tissue lesion eroding the left sternal body 3.1 cm was 2.5 cm.  Bronchiectatic changes   06/19/2019 - 05/05/2020 Chemotherapy   The patient had ado-trastuzumab emtansine (KADCYLA) 180 mg in sodium chloride 0.9 % 250 mL chemo infusion, 3.6 mg/kg = 180 mg, Intravenous, Once, 16 of 16 cycles Administration: 180 mg (06/19/2019), 180 mg (07/10/2019), 180 mg (09/17/2019), 180 mg (08/05/2019), 180 mg (08/27/2019), 180 mg (10/08/2019), 180 mg (10/29/2019), 180 mg (11/19/2019), 200 mg (12/10/2019), 200 mg (12/31/2019), 200 mg (01/20/2020), 200 mg (02/11/2020), 200 mg (03/03/2020), 200 mg (03/24/2020), 200 mg (04/14/2020), 200 mg (05/05/2020)  for chemotherapy treatment.    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[facility])   06/19/2019 - 05/05/2020 Chemotherapy   The patient had ado-trastuzumab emtansine (KADCYLA) 180 mg in sodium chloride 0.9 % 250 mL chemo infusion, 3.6 mg/kg = 180 mg, Intravenous, Once, 16 of 16 cycles Administration: 180 mg (06/19/2019), 180 mg (07/10/2019), 180 mg (09/17/2019), 180 mg (08/05/2019), 180 mg (08/27/2019), 180 mg (10/08/2019), 180 mg (10/29/2019), 180 mg (11/19/2019), 200 mg (12/10/2019), 200 mg (12/31/2019), 200 mg (01/20/2020), 200 mg (02/11/2020), 200 mg (03/03/2020), 200 mg (03/24/2020), 200 mg (04/14/2020), 200 mg (05/05/2020)  for chemotherapy treatment.      CHIEF COMPLIANT: Follow-up of  metastatic breast cancer  INTERVAL HISTORY: Melissa [name] is a 55 y.o. with above-mentioned history of metastatic breast cancer with brain metastasiss/presection who is currently ontreatment with Tucatinib with Xeloda and Herceptin.She presents to the clinic todayfortreatment.   ALLERGIES:   is allergic to aspirin, protonix [pantoprazole], and iodinated diagnostic agents.  MEDICATIONS:  Current Outpatient Medications  Medication Sig Dispense Refill  . albuterol (VENTOLIN HFA) 108 (90 Base) MCG/ACT inhaler Inhale 2 puffs into the lungs every 6 (six) hours as needed. 8 g 5  . budesonide-formoterol (SYMBICORT) 160-4.5 MCG/ACT inhaler Inhale 2 puffs into the lungs 2 (two) times daily. 3 Inhaler 3  . capecitabine (XELODA) 500 MG tablet Take 2 tablets (1,000 mg total) by mouth 2 (two) times daily after a meal. Take 2 tabs twice daily for 14 days on and 7 days off 56 tablet 3  . chlorpheniramine-HYDROcodone (TUSSIONEX) 10-8 MG/5ML SUER Take 5 mLs by mouth at bedtime. 473 mL 0  . levalbuterol (XOPENEX) 0.63 MG/3ML nebulizer solution Take 3 mLs (0.63 mg total) by nebulization every 4 (four) hours as needed for wheezing or shortness of breath. 540 mL 6  . naproxen sodium (ALEVE) 220 MG tablet Take 440 mg by mouth 2 (two) times daily as needed (headache).     Marland Kitchen omeprazole (PRILOSEC) 40 MG capsule TAKE 1 CAPSULE(40 MG) BY MOUTH DAILY 30 capsule 3  . predniSONE (DELTASONE) 50 MG tablet Take 1 tablet 13 hours, 7 hours, and 1 hour prior to scans. 6 tablet 0  . tucatinib (TUKYSA) 150 MG tablet Take 2 tablets (300 mg total) by mouth in the morning and at bedtime. Take every 12 hrs at the same time each day with or without a meal. 120 tablet 3  . voriconazole (VFEND) 200 MG tablet Take 1 tablet (200 mg total) by mouth 2 (two) times daily. 60 tablet 11   No current facility-administered medications for this visit.   Facility-Administered Medications Ordered in Other Visits  Medication Dose Route Frequency Provider Last Rate Last Admin  . sodium chloride flush (NS) 0.9 % injection 10 mL  10 mL Intracatheter PRN Melissa Lose, MD   10 mL at 07/28/20 1035    PHYSICAL EXAMINATION: ECOG PERFORMANCE STATUS: 1 - Symptomatic but completely ambulatory  There were no vitals filed for this visit. There  were no vitals filed for this visit.  LABORATORY DATA:  I have reviewed the data as listed CMP Latest Ref Rng & Units 06/15/2020 05/05/2020 04/14/2020  Glucose 70 - 99 mg/dL 115(H) 103(H) 84  BUN 6 - 20 mg/dL $Remove'11 9 8  'UEGJvTB$ Creatinine 0.44 - 1.00 mg/dL 0.99 0.87 0.84  Sodium 135 - 145 mmol/L 139 137 138  Potassium 3.5 - 5.1 mmol/L 3.6 3.6 3.8  Chloride 98 - 111 mmol/L 107 104 102  CO2 22 - 32 mmol/L $RemoveB'26 26 28  'oETDpjvh$ Calcium 8.9 - 10.3 mg/dL 9.4 9.7 9.9  Total Protein 6.5 - 8.1 g/dL 7.5 8.4(H) 8.5(H)  Total Bilirubin 0.3 - 1.2 mg/dL <0.2(L) <0.2(L) <0.2(L)  Alkaline Phos 38 - 126 U/L 123 139(H) 146(H)  AST 15 - 41 U/L $Remo'16 22 25  'otBoU$ ALT 0 - 44 U/L $Remo'9 14 18    'zSTKr$ Lab Results  Component Value Date   WBC 7.8 06/15/2020   HGB 8.7 (L) 06/15/2020   HCT 27.4 (L) 06/15/2020   MCV 90.1 06/15/2020   PLT 266 06/15/2020   NEUTROABS 5.6 06/15/2020    ASSESSMENT & PLAN:  Breast cancer of upper-inner quadrant of left female  breast ([place]) 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 Anti Her 2 therapy with Lapatinibstarted 09/17/2017-05/14/2019: Stopped for progression 3.I discontinuedtamoxifen and started her on letrozole 2.5 mg daily.05/14/2019 stopped for progression 4.Stereotactic radiosurgery 12/19/2017 to the new right parietal lobe metastases. 5.  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  Overall tolerating it very well.  Severe anemia: Asymptomatic we will watch.  Previously received blood transfusion Chronic dry cough: I renewed a prescription for cough syrup She has an MRI of the brain scheduled for February. Whole-body scans can be done every 6 months.  Return to clinic every 3 weeks for Herceptin treatments.  I will see her in 6 weeks with labs and follow-up.    No orders of the defined types were placed in this encounter.  The patient has a good understanding of the overall plan. she agrees with it. she will call with any problems that may develop before the next visit here.  Total time spent: 30 mins including face to face time and time spent for planning, charting and coordination of care  Melissa Lose, MD 07/28/2020  I, Cloyde Reams Dorshimer, am acting as scribe for Dr. Nicholas [name].  I have reviewed the above documentation for accuracy and completeness, and I agree with the above.

## 2020-07-28 ENCOUNTER — Other Ambulatory Visit: Payer: Self-pay | Admitting: *Deleted

## 2020-07-28 ENCOUNTER — Inpatient Hospital Stay: Payer: PRIVATE HEALTH INSURANCE

## 2020-07-28 ENCOUNTER — Other Ambulatory Visit: Payer: Self-pay

## 2020-07-28 ENCOUNTER — Inpatient Hospital Stay: Payer: PRIVATE HEALTH INSURANCE | Attending: Medical

## 2020-07-28 ENCOUNTER — Inpatient Hospital Stay (HOSPITAL_BASED_OUTPATIENT_CLINIC_OR_DEPARTMENT_OTHER): Payer: PRIVATE HEALTH INSURANCE | Admitting: Hematology and Oncology

## 2020-07-28 VITALS — BP 111/57 | HR 82 | Temp 97.2°F | Resp 16 | Ht 62.0 in | Wt 107.4 lb

## 2020-07-28 DIAGNOSIS — C787 Secondary malignant neoplasm of liver and intrahepatic bile duct: Secondary | ICD-10-CM | POA: Insufficient documentation

## 2020-07-28 DIAGNOSIS — C50212 Malignant neoplasm of upper-inner quadrant of left female breast: Secondary | ICD-10-CM | POA: Diagnosis not present

## 2020-07-28 DIAGNOSIS — C7951 Secondary malignant neoplasm of bone: Secondary | ICD-10-CM | POA: Insufficient documentation

## 2020-07-28 DIAGNOSIS — C50912 Malignant neoplasm of unspecified site of left female breast: Secondary | ICD-10-CM

## 2020-07-28 DIAGNOSIS — C7931 Secondary malignant neoplasm of brain: Secondary | ICD-10-CM

## 2020-07-28 DIAGNOSIS — Z171 Estrogen receptor negative status [ER-]: Secondary | ICD-10-CM

## 2020-07-28 DIAGNOSIS — Z5112 Encounter for antineoplastic immunotherapy: Secondary | ICD-10-CM | POA: Diagnosis not present

## 2020-07-28 DIAGNOSIS — Z170 Estrogen receptor positive status [ER+]: Secondary | ICD-10-CM | POA: Diagnosis not present

## 2020-07-28 DIAGNOSIS — Z95828 Presence of other vascular implants and grafts: Secondary | ICD-10-CM

## 2020-07-28 LAB — CBC WITH DIFFERENTIAL (CANCER CENTER ONLY)
Abs Immature Granulocytes: 0.03 10*3/uL (ref 0.00–0.07)
Basophils Absolute: 0.1 10*3/uL (ref 0.0–0.1)
Basophils Relative: 1 %
Eosinophils Absolute: 0.8 10*3/uL — ABNORMAL HIGH (ref 0.0–0.5)
Eosinophils Relative: 9 %
HCT: 27.4 % — ABNORMAL LOW (ref 36.0–46.0)
Hemoglobin: 8.9 g/dL — ABNORMAL LOW (ref 12.0–15.0)
Immature Granulocytes: 0 %
Lymphocytes Relative: 20 %
Lymphs Abs: 1.9 10*3/uL (ref 0.7–4.0)
MCH: 27.7 pg (ref 26.0–34.0)
MCHC: 32.5 g/dL (ref 30.0–36.0)
MCV: 85.4 fL (ref 80.0–100.0)
Monocytes Absolute: 0.6 10*3/uL (ref 0.1–1.0)
Monocytes Relative: 6 %
Neutro Abs: 6.1 10*3/uL (ref 1.7–7.7)
Neutrophils Relative %: 64 %
Platelet Count: 281 10*3/uL (ref 150–400)
RBC: 3.21 MIL/uL — ABNORMAL LOW (ref 3.87–5.11)
RDW: 21 % — ABNORMAL HIGH (ref 11.5–15.5)
WBC Count: 9.5 10*3/uL (ref 4.0–10.5)
nRBC: 0 % (ref 0.0–0.2)

## 2020-07-28 LAB — CMP (CANCER CENTER ONLY)
ALT: 13 U/L (ref 0–44)
AST: 21 U/L (ref 15–41)
Albumin: 3.5 g/dL (ref 3.5–5.0)
Alkaline Phosphatase: 133 U/L — ABNORMAL HIGH (ref 38–126)
Anion gap: 9 (ref 5–15)
BUN: 15 mg/dL (ref 6–20)
CO2: 23 mmol/L (ref 22–32)
Calcium: 9.5 mg/dL (ref 8.9–10.3)
Chloride: 106 mmol/L (ref 98–111)
Creatinine: 0.85 mg/dL (ref 0.44–1.00)
GFR, Estimated: >60 mL/min (ref >60)
Glucose, Bld: 91 mg/dL (ref 70–99)
Potassium: 4 mmol/L (ref 3.5–5.1)
Sodium: 138 mmol/L (ref 135–145)
Total Bilirubin: 0.3 mg/dL (ref 0.3–1.2)
Total Protein: 7.7 g/dL (ref 6.5–8.1)

## 2020-07-28 MED ORDER — SODIUM CHLORIDE 0.9% FLUSH
10.0000 mL | INTRAVENOUS | Status: DC | PRN
  Administered 2020-07-28: 10 mL
  Filled 2020-07-28: qty 10

## 2020-07-28 MED ORDER — ACETAMINOPHEN 325 MG PO TABS
ORAL_TABLET | ORAL | Status: AC
  Filled 2020-07-28: qty 2

## 2020-07-28 MED ORDER — HEPARIN SOD (PORK) LOCK FLUSH 100 UNIT/ML IV SOLN
500.0000 [IU] | Freq: Once | INTRAVENOUS | Status: AC | PRN
  Administered 2020-07-28: 500 [IU]
  Filled 2020-07-28: qty 5

## 2020-07-28 MED ORDER — SODIUM CHLORIDE 0.9 % IV SOLN
Freq: Once | INTRAVENOUS | Status: AC
  Filled 2020-07-28: qty 250

## 2020-07-28 MED ORDER — TRASTUZUMAB-DKST CHEMO 150 MG IV SOLR
300.0000 mg | Freq: Once | INTRAVENOUS | Status: AC
  Administered 2020-07-28: 300 mg via INTRAVENOUS
  Filled 2020-07-28: qty 14.29

## 2020-07-28 MED ORDER — ACETAMINOPHEN 325 MG PO TABS
650.0000 mg | ORAL_TABLET | Freq: Once | ORAL | Status: AC
  Administered 2020-07-28: 650 mg via ORAL

## 2020-07-28 MED ORDER — DIPHENHYDRAMINE HCL 25 MG PO CAPS
25.0000 mg | ORAL_CAPSULE | Freq: Once | ORAL | Status: AC
Start: 2020-07-28 — End: 2020-07-28
  Administered 2020-07-28: 25 mg via ORAL

## 2020-07-28 MED ORDER — DIPHENHYDRAMINE HCL 25 MG PO CAPS
ORAL_CAPSULE | ORAL | Status: AC
  Filled 2020-07-28: qty 1

## 2020-07-28 MED ORDER — HYDROCOD POLST-CPM POLST ER 10-8 MG/5ML PO SUER: 5.0000 mL | Freq: Every day | ORAL | 0 refills | Status: DC

## 2020-07-28 NOTE — Patient Instructions (Signed)
[facility] Discharge Instructions for Patients Receiving Chemotherapy  Today you received the following chemotherapy agents trastuzumab.  To help prevent nausea and vomiting after your treatment, we encourage you to take your nausea medication as directed.    If you develop nausea and vomiting that is not controlled by your nausea medication, call the clinic.   BELOW ARE SYMPTOMS THAT SHOULD BE REPORTED IMMEDIATELY:  *FEVER GREATER THAN 100.5 F  *CHILLS WITH OR WITHOUT FEVER  NAUSEA AND VOMITING THAT IS NOT CONTROLLED WITH YOUR NAUSEA MEDICATION  *UNUSUAL SHORTNESS OF BREATH  *UNUSUAL BRUISING OR BLEEDING  TENDERNESS IN MOUTH AND THROAT WITH OR WITHOUT PRESENCE OF ULCERS  *URINARY PROBLEMS  *BOWEL PROBLEMS  UNUSUAL RASH Items with * indicate a potential emergency and should be followed up as soon as possible.  Feel free to call the clinic should you have any questions or concerns. The clinic phone number is [phone].  Please show the CHEMO ALERT CARD at check-in to the Emergency Department and triage nurse.   

## 2020-07-29 ENCOUNTER — Telehealth: Payer: Self-pay | Admitting: Hematology and Oncology

## 2020-07-29 NOTE — Telephone Encounter (Signed)
Scheduled per 1/18 los. Pt will receive an updated appt calendar per next visit appt notes  

## 2020-08-05 ENCOUNTER — Telehealth: Payer: Self-pay

## 2020-08-05 ENCOUNTER — Encounter: Payer: Self-pay | Admitting: Urology

## 2020-08-05 ENCOUNTER — Other Ambulatory Visit: Payer: Self-pay

## 2020-08-05 NOTE — Telephone Encounter (Signed)
Spoke with patient in regards to telephone visit with Freeman Caldron PA on 08/19/20 @ 10:30am. Patient verbalized understanding of appointment date and time. Reviewed meaningful use questions. TM

## 2020-08-10 MED FILL — TUKYSA 150 MG TABS: 150 | 30 days supply | Qty: 120 | Fill #2

## 2020-08-10 MED FILL — CAPECITABINE 500 MG TABLET: 500 | 21 days supply | Qty: 56 | Fill #2

## 2020-08-13 ENCOUNTER — Ambulatory Visit (HOSPITAL_COMMUNITY)
Admission: RE | Admit: 2020-08-13 | Discharge: 2020-08-13 | Disposition: A | Discharge status: Home / Self Care | Payer: PRIVATE HEALTH INSURANCE | Source: Ambulatory Visit | Attending: Radiation Oncology | Admitting: Radiation Oncology

## 2020-08-13 ENCOUNTER — Other Ambulatory Visit: Payer: Self-pay

## 2020-08-13 DIAGNOSIS — C7949 Secondary malignant neoplasm of other parts of nervous system: Secondary | ICD-10-CM | POA: Diagnosis present

## 2020-08-13 DIAGNOSIS — C7931 Secondary malignant neoplasm of brain: Secondary | ICD-10-CM

## 2020-08-13 MED ORDER — SODIUM CHLORIDE 0.9% FLUSH: 10.0000 mL | INTRAVENOUS | Status: DC | PRN

## 2020-08-13 MED ORDER — CHLORHEXIDINE GLUCONATE CLOTH 2 % EX PADS: 6.0000 | MEDICATED_PAD | Freq: Every day | CUTANEOUS | Status: DC

## 2020-08-13 MED ORDER — GADOBUTROL 1 MMOL/ML IV SOLN
5.0000 mL | Freq: Once | INTRAVENOUS | Status: AC | PRN
  Administered 2020-08-13: 5 mL via INTRAVENOUS

## 2020-08-13 MED ORDER — SODIUM CHLORIDE 0.9% FLUSH: 10.0000 mL | Freq: Two times a day (BID) | INTRAVENOUS | Status: DC

## 2020-08-13 MED ORDER — HEPARIN SOD (PORK) LOCK FLUSH 100 UNIT/ML IV SOLN
500.0000 [IU] | INTRAVENOUS | Status: AC | PRN
  Administered 2020-08-13: 500 [IU]
  Filled 2020-08-13: qty 5

## 2020-08-17 ENCOUNTER — Inpatient Hospital Stay: Payer: PRIVATE HEALTH INSURANCE | Attending: Medical

## 2020-08-17 ENCOUNTER — Inpatient Hospital Stay: Payer: PRIVATE HEALTH INSURANCE

## 2020-08-17 ENCOUNTER — Other Ambulatory Visit: Payer: Self-pay

## 2020-08-17 VITALS — BP 130/60 | HR 86 | Temp 98.6°F | Resp 18 | Wt 106.5 lb

## 2020-08-17 DIAGNOSIS — Z170 Estrogen receptor positive status [ER+]: Secondary | ICD-10-CM | POA: Insufficient documentation

## 2020-08-17 DIAGNOSIS — Z171 Estrogen receptor negative status [ER-]: Secondary | ICD-10-CM

## 2020-08-17 DIAGNOSIS — C7931 Secondary malignant neoplasm of brain: Secondary | ICD-10-CM | POA: Insufficient documentation

## 2020-08-17 DIAGNOSIS — C787 Secondary malignant neoplasm of liver and intrahepatic bile duct: Secondary | ICD-10-CM | POA: Diagnosis present

## 2020-08-17 DIAGNOSIS — Z452 Encounter for adjustment and management of vascular access device: Secondary | ICD-10-CM | POA: Insufficient documentation

## 2020-08-17 DIAGNOSIS — C7951 Secondary malignant neoplasm of bone: Secondary | ICD-10-CM | POA: Insufficient documentation

## 2020-08-17 DIAGNOSIS — C50212 Malignant neoplasm of upper-inner quadrant of left female breast: Secondary | ICD-10-CM | POA: Diagnosis present

## 2020-08-17 DIAGNOSIS — Z5112 Encounter for antineoplastic immunotherapy: Secondary | ICD-10-CM | POA: Diagnosis not present

## 2020-08-17 MED ORDER — ACETAMINOPHEN 325 MG PO TABS
ORAL_TABLET | ORAL | Status: AC
  Filled 2020-08-17: qty 2

## 2020-08-17 MED ORDER — HEPARIN SOD (PORK) LOCK FLUSH 100 UNIT/ML IV SOLN
500.0000 [IU] | Freq: Once | INTRAVENOUS | Status: AC | PRN
  Administered 2020-08-17: 500 [IU]
  Filled 2020-08-17: qty 5

## 2020-08-17 MED ORDER — SODIUM CHLORIDE 0.9 % IV SOLN
Freq: Once | INTRAVENOUS | Status: AC
  Filled 2020-08-17: qty 250

## 2020-08-17 MED ORDER — SODIUM CHLORIDE 0.9% FLUSH
10.0000 mL | INTRAVENOUS | Status: DC | PRN
Start: 2020-08-17 — End: 2020-08-17
  Administered 2020-08-17: 10 mL
  Filled 2020-08-17: qty 10

## 2020-08-17 MED ORDER — DIPHENHYDRAMINE HCL 25 MG PO CAPS
ORAL_CAPSULE | ORAL | Status: AC
  Filled 2020-08-17: qty 1

## 2020-08-17 MED ORDER — DIPHENHYDRAMINE HCL 25 MG PO CAPS
25.0000 mg | ORAL_CAPSULE | Freq: Once | ORAL | Status: AC
  Administered 2020-08-17: 25 mg via ORAL

## 2020-08-17 MED ORDER — TRASTUZUMAB-DKST CHEMO 150 MG IV SOLR
300.0000 mg | Freq: Once | INTRAVENOUS | Status: AC
  Administered 2020-08-17: 300 mg via INTRAVENOUS
  Filled 2020-08-17: qty 14.29

## 2020-08-17 MED ORDER — ACETAMINOPHEN 325 MG PO TABS
650.0000 mg | ORAL_TABLET | Freq: Once | ORAL | Status: AC
  Administered 2020-08-17: 650 mg via ORAL

## 2020-08-17 NOTE — Patient Instructions (Signed)
[facility] Discharge Instructions for Patients Receiving Chemotherapy  Today you received the following chemotherapy agents trastuzumab.  To help prevent nausea and vomiting after your treatment, we encourage you to take your nausea medication as directed.    If you develop nausea and vomiting that is not controlled by your nausea medication, call the clinic.   BELOW ARE SYMPTOMS THAT SHOULD BE REPORTED IMMEDIATELY:  *FEVER GREATER THAN 100.5 F  *CHILLS WITH OR WITHOUT FEVER  NAUSEA AND VOMITING THAT IS NOT CONTROLLED WITH YOUR NAUSEA MEDICATION  *UNUSUAL SHORTNESS OF BREATH  *UNUSUAL BRUISING OR BLEEDING  TENDERNESS IN MOUTH AND THROAT WITH OR WITHOUT PRESENCE OF ULCERS  *URINARY PROBLEMS  *BOWEL PROBLEMS  UNUSUAL RASH Items with * indicate a potential emergency and should be followed up as soon as possible.  Feel free to call the clinic should you have any questions or concerns. The clinic phone number is [phone].  Please show the CHEMO ALERT CARD at check-in to the Emergency Department and triage nurse.   

## 2020-08-19 ENCOUNTER — Other Ambulatory Visit: Payer: Self-pay | Admitting: Hematology and Oncology

## 2020-08-19 ENCOUNTER — Ambulatory Visit
Admission: RE | Admit: 2020-08-19 | Discharge: 2020-08-19 | Disposition: A | Discharge status: Home / Self Care | Payer: PRIVATE HEALTH INSURANCE | Source: Ambulatory Visit | Attending: Radiation Oncology | Admitting: Radiation Oncology

## 2020-08-19 ENCOUNTER — Other Ambulatory Visit: Payer: Self-pay

## 2020-08-19 DIAGNOSIS — C50912 Malignant neoplasm of unspecified site of left female breast: Secondary | ICD-10-CM

## 2020-08-19 DIAGNOSIS — Z171 Estrogen receptor negative status [ER-]: Secondary | ICD-10-CM

## 2020-08-19 DIAGNOSIS — C7949 Secondary malignant neoplasm of other parts of nervous system: Secondary | ICD-10-CM

## 2020-08-19 DIAGNOSIS — C7931 Secondary malignant neoplasm of brain: Secondary | ICD-10-CM

## 2020-08-19 NOTE — Progress Notes (Addendum)
Radiation Oncology         [phone] ________________________________  Name: Melissa [name] MRN: [id number]  Date: 08/19/2020  DOB: Oct 30, 1965  Post Treatment Note  CC: Patient, [name]  [name], Loura Halt, *  Diagnosis:   54 y.o. 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  Interval Since Last Radiation:  3 months 05/13/20:  SRS brain to the following 4 targets treated to a prescription dose of 20 Gy in a single fraction with a single isocenter: PTV6 Mid Cerebellum 82mm PTV7 Lt Occipital 40mm PTV8 Lt Occipital 20mm PTV9 Lt Temporal 57mm   03/25/2019:  SRS brain//PTV 5:  Right frontal 5 mm target was treated to a prescription dose of 20 Gy in a single fraction.   09/04/2018:   SRS Brain// Right Frontal, 2 targets / 20 Gy in 1 fraction PTV3: Ant Rt Frontal 34mm 20Gy PTV4: Rt Frontal resection cavity 32mm  20Gy  12/18/2017: SRS brain//PTV2: 4 mm Rt Parietal lesion treated to 20 Gy in 1 Fx  08/25/2017, 08/28/2017, 08/30/2017, 09/01/2017, 09/04/2017: PTV1: post op SRS to right frontal lobe resection cavity in 5 fxs  Narrative:  In summary, she initially presented to the emergency Department on 07/27/2017 with complaints of headaches ongoing for approximately 5 weeks with associated nausea and vomiting. She was initially treated by her PCP for sinusitis without improvement.  She also has a history of migraine headaches and had ben evaluated in the ED at [facility] on 06/20/2017 for treatment of what she thought was a typical migraine headache given the fact that she had her usual blurry vision and aura preceding the headache. She reports that the headache did improve with treatment but the relief was short-lived as the headache returned the very next day. She followed up with her primary care physician and a CT of the head was going to be scheduled due to the persistent headaches but in the interim, she presented to  the Emergency Department at [facility] due to increased severity of the headache with associated nausea and vomiting. The headaches were occuring in the frontal lobe as well as bilateral temporal with occasional radiation to the occipital lobe and associated with blurry vision, decreased appetite, fatigue, nausea and vomiting.  She denied any difficulty with speech, memory, imbalance or focal weakness.  CT Head on admission 07/27/2017 showed a large right frontal lobe mass measuring 3.1 by 2.5 cm with significant surrounding edema with mass effect and right to left midline shift measuring 8 mm. A subsequent Brain MRI showed a 3.4 x 2.9 x 2.9 cm right frontal lobe mass with imaging characteristics of solitary metastasis and extensive vasogenic edema resulting in 9 mm right to left midline shift as well as equivocal very early left ventricle entrapment.   CT Chest, Abdomen, Pelvis on 07/28/2017 for disease staging showed no findings to suggest metastatic breast cancer involving the chest, abdomen, pelvis or osseous structures. 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 Surgical changes involving the left breast and left axilla but no findings for local recurrence or regional adenopathy.  She proceeded with a gross total resection of the frontal lesion on 08/05/17 with Dr. Bevely Palmer followed by adjuvant fractionated postop SRS radiotherapy to the resection cavity in February 2019. Final surgical pathology revealed poorly differentiated adenocarcinoma consistent with metastatic breast cancer, ER/PR positive and HER-2 positive.   She tolerated SRS treatment well and initial  post treatment MRI brain on 12/07/17 showed satisfactory appearance of the right anterior frontal lobe post treatment site with expected evolution but unfortunately also showed a single, new 2 - 3 mm lesion in the right parietal lobe without associated edema or mass-effect.  She elected to undergo  salvage SRS treatment to the new lesion in the parietal lobe which was completed on 12/18/17 and tolerated well.  A follow up brain MRI on 05/18/18 showed a stable right frontal resection cavity with stable 4 mm nodular enhancement at the inferior cavity margin but no evidence of new disease.  Unfortunately, the follow up MRI brain scan on 08/23/2018 showed a new 11 mm inferior right frontal lesion with only mild associated edema and no mass-effect.  She reported that she had been having more frequent frontal headaches which were not severe and responded to Aleve and otherwise, had been feeling well.  She elected to proceed with a single fraction of SRS to this new brain metastasis as recommended and this was completed on  09/04/18 and tolerated very well.   She had bronchoscopy with Dr. Valeta Harms in 05/2018 and dx'ed with Aspergillus- ID following (Dr. Graylon Good).  She also continues in routine follow up with Dr. Melvyn Novas in Pulmonology.  Follow up MRI brain scan from 03/08/19 showed continued interval enlargement of the nodular focus with enhancement at the superior margin of the right frontal resection cavity, measuring 5 x 6 mm in diameter compared with 3 x 4.5 mm on the previous study. Two smaller foci of enhancement along the inferior margin were unchanged and there was no evidence of increasing mass effect or edema and no new lesions seen elsewhere within the brain.  She elected to proceed with SRS treatment of the progressive nodular lesion which was completed on 03/25/19 and tolerated well without any acute ill effects.    She developed systemic disease progression, particularly in the chest with increase in size of enhancing nodular left internal mammary soft tissue mass, lymph nodes and left third rib lesion and soft tissue lesion eroding the left sternal body as well as a new, hypermetabolic 6.5 cm liver lesion seen on PET scan from 05/23/2019 despite continuing onlapatinibwith Letrozole. She did have an  ultrasound-guided biopsy of the liver mass on 06/05/2019 which confirmed metastatic breast cancer, HER-2 positive, ER/PR negative. Her systemic therapy was changed to Edison beginning on 06/19/2019, under the care and direction of Dr. Lindi Adie which she tolerated very well.  A follow-up CT chest from 07/19/2019 showed a positive response to treatment with decrease in size of the soft tissue mass in the breast as well as bony lesions and adenopathy.  Fortunately, her follow-up MRI brain from 08/02/2019 showed stability of the previously treated disease and no new or progressive findings. A repeat MRI brain scan 11/04/19 was felt to be overall stable but there was a slight change in a previously treated right inferior frontal lesion, measuring 5 mm, previously 4 mm, with a new, separate punctate focus of enhancement just posterior to that. This was such minimal change and when her prior treatment fields were fused with recent scan, it appeared that the new area of enhancement was on the field border of GTV3 and would have received a large portion of the delivered dose.  Therefore, consensus recommendation was to simply monitor this area with routine 3 month follow up brain imaging. She had repeat MRI on 01/30/20 and this showed overall disease stability with an unchanged appearance of the 2 previously treated right  frontal lobe deposits and no new or progressive disease. Repeat systemic disease staging scans were performed on 01/29/20 and 02/04/20.  The bone scan on 01/29/20 showed less uptake of tracer at the previously identified sternal and LEFT third rib lesions, consistent with response to therapy and there were no new sites of osseous metastasis noted. The CT C/A/P performed on 02/04/20 also showed disease stability with a new cluster of spiculated nodule or opacities in the posterior left lung favoring aspergillosis or other atypical infectious etiology.  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  There was no definite evidence of metastatic disease within the abdomen or pelvis. She continued on Kadcycla, s/p 14 cycles as of 05/05/20.   Repeat MRI brain from 05/01/20 showed 4 new metastatic lesions, all subcentimeter with a 60mm midline cerebellar vermian metastasis, 38mm left occipital metastasis, 21mm right occipital metastasis and a punctate lesion in the left temporal lobe.  The concensus recommendation was to proceed with a single fraction of SRS to the 4 new metastases which she was in agreement with and was completed on 05/13/21.  Her restaging systemic imaging from 05/28/20 was without evidence of any disease progression or recurrence in the chest, abdomen or pelvis.  At the time of her follow up with Dr. Lindi Adie on 05/05/2020, the decision was made to discontinue the Kadcycla, due to disease progression in the brain, and switch to Tucatinib, oral systemic therapy in combination with Herceptin and Xeloda which was started 06/11/20.     Interval History:   She has remained clinically stable since her last treatment and is currently without complaints.  She has continued to tolerate her systemic therapy fairly well aside from fatigue. We reviewed her most recent follow up brain MRI from 08/13/20 which unfortunately shows disease progression with 5 new lesions: with a 2 mm left frontal lesion, 2 mm right frontal lesion, 2 mm right occipital lesion, 2 mm right temporal lesion and a 5 mm left temporal lesion.  The 1 mm  Right occipital lesion was initially thought to be new but on further review of prior treatment plans, it was noted to have been treated previously and stable.  Otherwise, the other previously treated lesions are improved and/or stable.  The consensus recommendation at brain conference is to proceed with a single fraction of SRS to the 5 new brain  lesions.  On review of systems, the patient states that she is doing well overall.  She is without complaints aside from fatigue and chronic cough.  She has continued with improved energy levels and has been able to continue to work and has recently moved her parents to a retirement facility. She denies headaches, decreased visual or auditory acuity, tinnitus, tremor, focal weakness or seizure activity. She is not having difficulty with speech or word finding.  She denies abdominal pain, N/V or diarrhea.  She continues on the new systemic therapy and is tolerating this well. She reports a healthy appetite and is maintaining her weight which she is pleased with.  ALLERGIES:  is allergic to aspirin, protonix [pantoprazole], and iodinated diagnostic agents.  Meds: Current Outpatient Medications  Medication Sig Dispense Refill  . albuterol (VENTOLIN HFA) 108 (90 Base) MCG/ACT inhaler Inhale 2 puffs into the lungs every 6 (six) hours as needed. 8 g 5  . budesonide-formoterol (SYMBICORT) 160-4.5 MCG/ACT inhaler Inhale 2 puffs  into the lungs 2 (two) times daily. 3 Inhaler 3  . capecitabine (XELODA) 500 MG tablet Take 2 tablets (1,000 mg total) by mouth 2 (two) times daily after a meal. Take 2 tabs twice daily for 14 days on and 7 days off 56 tablet 3  . chlorpheniramine-HYDROcodone (TUSSIONEX) 10-8 MG/5ML SUER Take 5 mLs by mouth at bedtime. 473 mL 0  . levalbuterol (XOPENEX) 0.63 MG/3ML nebulizer solution Take 3 mLs (0.63 mg total) by nebulization every 4 (four) hours as needed for wheezing or shortness of breath. 540 mL 6  . naproxen sodium (ALEVE) 220 MG tablet Take 440 mg by mouth 2 (two) times daily as needed (headache).     Marland Kitchen omeprazole (PRILOSEC) 40 MG capsule TAKE 1 CAPSULE(40 MG) BY MOUTH DAILY 30 capsule 3  . predniSONE (DELTASONE) 50 MG tablet Take 1 tablet 13 hours, 7 hours, and 1 hour prior to scans. 6 tablet 0  . tucatinib (TUKYSA) 150 MG tablet Take 2 tablets (300 mg total) by mouth in the  morning and at bedtime. Take every 12 hrs at the same time each day with or without a meal. 120 tablet 3  . voriconazole (VFEND) 200 MG tablet Take 1 tablet (200 mg total) by mouth 2 (two) times daily. 60 tablet 11   No current facility-administered medications for this encounter.    Physical Findings:  vitals were not taken for this visit.   /Unable to assess due to telephone follow-up visit format.   Lab Findings: Lab Results  Component Value Date   WBC 9.5 07/28/2020   HGB 8.9 (L) 07/28/2020   HCT 27.4 (L) 07/28/2020   MCV 85.4 07/28/2020   PLT 281 07/28/2020     Radiographic Findings: MR Brain W Wo Contrast  Result Date: 08/13/2020 CLINICAL DATA:  Secondary malignant neoplasm of brain and spinal cord. Surveillance. EXAM: MRI HEAD WITHOUT AND WITH CONTRAST TECHNIQUE: Multiplanar, multiecho pulse sequences of the brain and surrounding structures were obtained without and with intravenous contrast. CONTRAST:  44mL GADAVIST GADOBUTROL 1 MMOL/ML IV SOLN COMPARISON:  MRI of the brain May 01, 2020. FINDINGS: Brain: Craniotomy with focus of encephalomalacia and gliosis in the right frontal lobe. Irregular contrast enhancement surrounding the area of encephalomalacia appear increased in size when compared to prior MRI (series 1100, image 191). Multiple other foci of contrast enhancement are seen scattered throughout the brain parenchyma, showing evidence of mixed treatment response, labeled on series 1100: Decreased size of left occipital lesion, measuring 2 mm (4 mm on prior), image 165; Decreased size of left temporal lesion, less than 1 mm (1-2 mm on prior), image 153; Decreased size of cerebellar vermis lesion, 2 mm (3-4 mm on prior), image 150; Stable 2 mm right frontal lesion, image 233; Stable right inferior frontal lesion, 14 mm, image 159; Enlarged 2 mm left frontal lesion (1 mm on prior), image 202; New 2 mm right frontal lesion, image 226; New 2 mm right occipital lesion, image 180;  New 2 mm right temporal lesion, image 163; New right occipital lesion, 1 mm, image 161; New 5 mm left temporal lesion, image 141. Scattered foci of T2 hyperintensity are seen within the white matter of the cerebral hemispheres and within the pons, nonspecific, unchanged from prior. Vascular: Normal flow voids. Skull and upper cervical spine: Postsurgical changes from prior frontal craniotomy. No focal marrow lesion identified Sinuses/Orbits: Negative. IMPRESSION: Mixed treatment response with decrease in size of 3 lesions and multiple new lesions identified. Electronically Signed   By:  Pedro Earls M.D.   On: 08/13/2020 18:04    Impression/Plan: 1. 55 y.o. 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 We reviewed her recent follow up MRI brain scan from 08/13/20 which unfortunately shows disease progression with 5 new lesions: with a 2 mm left frontal lesion, 2 mm right frontal lesion, 2 mm right occipital lesion, 2 mm right temporal lesion and a 5 mm left temporal lesion.  On further review of the 1 mm right occipital lesion, it was determined to have been treated previously and stable. The other previously treated lesions are also noted improved and/or stable.  The consensus recommendation at brain conference is to proceed with a single fraction of SRS to the 5 new brain lesions. She has given verbal consent to proceed today and is scheduled for CT SIM/treatment planning at 8:30am on Thursday 08/20/20 in anticipation of proceeding with treatment on Wednesday 08/26/20. Her treatment has been coordinated with neurosurgery, c/o Dr. Vertell Limber, who will also participate in her care. She is comfortable and in agreement with the plan and will sign formal written consent to proceed at the time of her CT SIM/treatment planning. She appears to have a good understanding of her disease and our recommendations and is comfortable  and in agreement with the current plan as stated.  We will share our discussion with Dr. Lindi Adie and move forward with treatment planning accordingly.  She knows to call with any questions or concerns in the interim.   Nicholos Johns, PA-C    Tyler Pita, MD  [facility] Direct Dial: [phone]  Fax: [phone] [email or web address]  Skype  [email or web address]

## 2020-08-20 ENCOUNTER — Ambulatory Visit
Admission: RE | Admit: 2020-08-20 | Discharge: 2020-08-20 | Disposition: A | Discharge status: Home / Self Care | Payer: PRIVATE HEALTH INSURANCE | Source: Ambulatory Visit | Attending: Radiation Oncology | Admitting: Radiation Oncology

## 2020-08-20 ENCOUNTER — Other Ambulatory Visit: Payer: Self-pay

## 2020-08-20 DIAGNOSIS — C7931 Secondary malignant neoplasm of brain: Secondary | ICD-10-CM | POA: Insufficient documentation

## 2020-08-20 NOTE — Addendum Note (Signed)
Encounter addended by: Freeman Caldron, PA-C on: 08/20/2020 11:09 AM  Actions taken: Clinical Note Signed

## 2020-08-20 NOTE — Progress Notes (Signed)
  Radiation Oncology         [phone] ________________________________  Name: Melissa [name] MRN: [id number]  Date: 08/20/2020  DOB: March 08, 1966  SIMULATION AND TREATMENT PLANNING NOTE    ICD-10-CM   1. 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  NARRATIVE:  The patient was brought to the [facility].  Identity was confirmed.  All relevant records and images related to the planned course of therapy were reviewed.  The patient freely provided informed written consent to proceed with treatment after reviewing the details related to the planned course of therapy. The consent form was witnessed and verified by the simulation staff. Intravenous access was established for contrast administration. Then, the patient was set-up in a stable reproducible supine position for radiation therapy.  A relocatable thermoplastic stereotactic head frame was fabricated for precise immobilization.  CT images were obtained.  Surface markings were placed.  The CT images were loaded into the planning software and fused with the patient's targeting MRI scan.  Then the target and avoidance structures were contoured.  Treatment planning then occurred.  The radiation prescription was entered and confirmed.  I have requested 3D planning  I have requested a DVH of the following structures: Brain stem, brain, left eye, right eye, lenses, optic chiasm, target volumes, uninvolved brain, and normal tissue.    SPECIAL TREATMENT PROCEDURE:  The planned course of therapy using radiation constitutes a special treatment procedure. Special care is required in the management of this patient for the following reasons. This treatment constitutes a Special Treatment Procedure for the following reason: High dose per fraction requiring special monitoring for increased toxicities of treatment  including daily imaging.  The special nature of the planned course of radiotherapy will require increased physician supervision and oversight to ensure patient's safety with optimal treatment outcomes.  PLAN:  The patient will receive 20 Gy in 1 fraction to the following 6 lesions. Enlarged 2 mm left frontal lesion (1 mm on prior), image 202; New 2 mm right frontal lesion, image 226; New 2 mm right occipital lesion, image 180; New 2 mm right temporal lesion, image 163; New right occipital lesion, 1 mm, image 161; New 5 mm left temporal lesion, image 141. ________________________________  Sheral Apley Tammi Klippel, M.D.

## 2020-08-26 ENCOUNTER — Ambulatory Visit: Payer: PRIVATE HEALTH INSURANCE | Admitting: Radiation Oncology

## 2020-08-27 ENCOUNTER — Encounter: Payer: Self-pay | Admitting: Radiation Oncology

## 2020-08-27 ENCOUNTER — Ambulatory Visit
Admission: RE | Admit: 2020-08-27 | Discharge: 2020-08-27 | Disposition: A | Discharge status: Home / Self Care | Payer: PRIVATE HEALTH INSURANCE | Source: Ambulatory Visit | Attending: Radiation Oncology | Admitting: Radiation Oncology

## 2020-08-27 VITALS — BP 118/71 | HR 77 | Temp 97.6°F | Resp 18

## 2020-08-27 DIAGNOSIS — C7931 Secondary malignant neoplasm of brain: Secondary | ICD-10-CM

## 2020-08-27 NOTE — Progress Notes (Signed)
  Radiation Oncology         (336) [phone] ________________________________  Stereotactic Treatment Procedure Note  Name: Melissa [name] MRN: [id number]  Date: 08/27/2020  DOB: 02/24/1966  SPECIAL TREATMENT PROCEDURE    ICD-10-CM   1. Metastasis to brain [facility])  C79.31     3D TREATMENT PLANNING AND DOSIMETRY:  The patient's radiation plan was reviewed and approved by neurosurgery and radiation oncology prior to treatment.  It showed 3-dimensional radiation distributions overlaid onto the planning CT/MRI image set.  The [facility] for the target structures as well as the organs at risk were reviewed. The documentation of the 3D plan and dosimetry are filed in the radiation oncology EMR.  NARRATIVE:  Melissa [name] was brought to the TrueBeam stereotactic radiation treatment machine and placed supine on the CT couch. The head frame was applied, and the patient was set up for stereotactic radiosurgery.  Neurosurgery was present for the set-up and delivery  SIMULATION VERIFICATION:  In the couch zero-angle position, the patient underwent Exactrac imaging using the Brainlab system with orthogonal KV images.  These were carefully aligned and repeated to confirm treatment position for each of the isocenters.  The Exactrac snap film verification was repeated at each couch angle.  PROCEDURE: Melissa [name] received stereotactic radiosurgery to the following targets:   These five targets were treated using 8 Rapid Arc VMAT Beams to a prescription dose of 20 Gy.  ExacTrac registration was performed for each couch angle.  The 100% isodose line was prescribed.  6 MV X-rays were delivered in the flattening filter free beam mode.  STEREOTACTIC TREATMENT MANAGEMENT:  Following delivery, the patient was transported to nursing in stable condition and monitored for possible acute effects.  Vital signs were recorded LMP 07/25/2012 Comment: pregnancy waiver form signed 01-18-2018. The patient tolerated treatment without  significant acute effects, and was discharged to home in stable condition.    PLAN: Follow-up in one month.  ________________________________  Sheral Apley. Tammi Klippel, M.D.

## 2020-08-27 NOTE — Op Note (Signed)
  Name: CHASADY LONGWELL  MRN: [id number]  Date: 08/27/2020   DOB: Mar 18, 1966  Stereotactic Radiosurgery Operative Note  PRE-OPERATIVE DIAGNOSIS:  Multiple Brain Metastases  POST-OPERATIVE DIAGNOSIS:  Multiple Brain Metastases  PROCEDURE:  Stereotactic Radiosurgery  SURGEON:  Peggyann Shoals, MD  NARRATIVE: The patient underwent a radiation treatment planning session in the radiation oncology simulation suite under the care of the radiation oncology physician and physicist.  I participated closely in the radiation treatment planning afterwards. The patient underwent planning CT which was fused to 3T high resolution MRI with 1 mm axial slices.  These images were fused on the planning system.  We contoured the gross target volumes and subsequently expanded this to yield the Planning Target Volume. I actively participated in the planning process.  I helped to define and review the target contours and also the contours of the optic pathway, eyes, brainstem and selected nearby organs at risk.  All the dose constraints for critical structures were reviewed and compared to AAPM Task Group 101.  The prescription dose conformity was reviewed.  I approved the plan electronically.    Accordingly, [name] was brought to the TrueBeam stereotactic radiation treatment linac and placed in the custom immobilization mask.  The patient was aligned according to the IR fiducial markers with BrainLab Exactrac, then orthogonal x-rays were used in ExacTrac with the 6DOF robotic table and the shifts were made to align the patient  [name] received stereotactic radiosurgery uneventfully.    Lesions treated:  5  Complex lesions treated: 0 (>3.5 cm, <37mm of optic path, or within the brainstem)   The detailed description of the procedure is recorded in the radiation oncology procedure note.  I was present for the duration of the procedure.  DISPOSITION:  Following delivery, the patient was transported to nursing in  stable condition and monitored for possible acute effects to be discharged to home in stable condition with follow-up in one month.  Peggyann Shoals, MD 08/27/2020 3:40 PM

## 2020-08-27 NOTE — Progress Notes (Signed)
Nurse monitoring complete following SRS treatment. Patient denies development of new neurologic symptoms. Patient denies any needs at this time. Instructed patient to avoid strenuous activity for the next 24 hours. Instructed patient to phone [phone] with needs related to treatment after 1700. Patient verbalized understanding. Patient exited clinic ambulatory in no distress.  BP 118/71 (BP Location: Right Arm, Patient Position: Sitting, Cuff Size: Normal)   Pulse 77   Temp 97.6 F (36.4 C)   Resp 18   LMP 07/25/2012 Comment: pregnancy waiver form signed 01-18-2018  SpO2 100%

## 2020-08-31 ENCOUNTER — Inpatient Hospital Stay: Payer: PRIVATE HEALTH INSURANCE

## 2020-08-31 ENCOUNTER — Telehealth: Payer: Self-pay | Admitting: *Deleted

## 2020-08-31 ENCOUNTER — Other Ambulatory Visit: Payer: Self-pay

## 2020-08-31 DIAGNOSIS — Z95828 Presence of other vascular implants and grafts: Secondary | ICD-10-CM

## 2020-08-31 DIAGNOSIS — C50212 Malignant neoplasm of upper-inner quadrant of left female breast: Secondary | ICD-10-CM

## 2020-08-31 DIAGNOSIS — Z171 Estrogen receptor negative status [ER-]: Secondary | ICD-10-CM

## 2020-08-31 DIAGNOSIS — Z5112 Encounter for antineoplastic immunotherapy: Secondary | ICD-10-CM | POA: Diagnosis not present

## 2020-08-31 LAB — CMP (CANCER CENTER ONLY)
ALT: 11 U/L (ref 0–44)
AST: 21 U/L (ref 15–41)
Albumin: 3.7 g/dL (ref 3.5–5.0)
Alkaline Phosphatase: 161 U/L — ABNORMAL HIGH (ref 38–126)
Anion gap: 8 (ref 5–15)
BUN: 17 mg/dL (ref 6–20)
CO2: 25 mmol/L (ref 22–32)
Calcium: 9.4 mg/dL (ref 8.9–10.3)
Chloride: 105 mmol/L (ref 98–111)
Creatinine: 1.08 mg/dL — ABNORMAL HIGH (ref 0.44–1.00)
GFR, Estimated: >60 mL/min (ref >60)
Glucose, Bld: 102 mg/dL — ABNORMAL HIGH (ref 70–99)
Potassium: 4.1 mmol/L (ref 3.5–5.1)
Sodium: 138 mmol/L (ref 135–145)
Total Bilirubin: 0.2 mg/dL — ABNORMAL LOW (ref 0.3–1.2)
Total Protein: 7.6 g/dL (ref 6.5–8.1)

## 2020-08-31 LAB — CBC WITH DIFFERENTIAL (CANCER CENTER ONLY)
Abs Immature Granulocytes: 0.02 10*3/uL (ref 0.00–0.07)
Basophils Absolute: 0.1 10*3/uL (ref 0.0–0.1)
Basophils Relative: 1 %
Eosinophils Absolute: 0.9 10*3/uL — ABNORMAL HIGH (ref 0.0–0.5)
Eosinophils Relative: 9 %
HCT: 27 % — ABNORMAL LOW (ref 36.0–46.0)
Hemoglobin: 9 g/dL — ABNORMAL LOW (ref 12.0–15.0)
Immature Granulocytes: 0 %
Lymphocytes Relative: 24 %
Lymphs Abs: 2.5 10*3/uL (ref 0.7–4.0)
MCH: 30 pg (ref 26.0–34.0)
MCHC: 33.3 g/dL (ref 30.0–36.0)
MCV: 90 fL (ref 80.0–100.0)
Monocytes Absolute: 0.5 10*3/uL (ref 0.1–1.0)
Monocytes Relative: 5 %
Neutro Abs: 6.5 10*3/uL (ref 1.7–7.7)
Neutrophils Relative %: 61 %
Platelet Count: 237 10*3/uL (ref 150–400)
RBC: 3 MIL/uL — ABNORMAL LOW (ref 3.87–5.11)
RDW: 26.8 % — ABNORMAL HIGH (ref 11.5–15.5)
WBC Count: 10.4 10*3/uL (ref 4.0–10.5)
nRBC: 0 % (ref 0.0–0.2)

## 2020-08-31 MED ORDER — HEPARIN SOD (PORK) LOCK FLUSH 100 UNIT/ML IV SOLN
500.0000 [IU] | Freq: Once | INTRAVENOUS | Status: AC | PRN
  Administered 2020-08-31: 500 [IU]
  Filled 2020-08-31: qty 5

## 2020-08-31 MED ORDER — SODIUM CHLORIDE 0.9% FLUSH
10.0000 mL | INTRAVENOUS | Status: DC | PRN
Start: 2020-08-31 — End: 2020-08-31
  Administered 2020-08-31: 10 mL
  Filled 2020-08-31: qty 10

## 2020-08-31 NOTE — Patient Instructions (Signed)

## 2020-08-31 NOTE — Telephone Encounter (Signed)
Notified that her Hgb was 9.0, she was relieved. She thinks it might be due to the recent RT. She is going to call us on Tuesday to let know how she is filling

## 2020-08-31 NOTE — Progress Notes (Signed)
Patient sent back to lab for blood draw.

## 2020-08-31 NOTE — Telephone Encounter (Signed)
Melissa [name] left a message requesting to have her labs checked. She thinks her Hgb might be low. Is feeling more tired,dizzy and her HR is elevated.   RN called her back to see if she wants to see Sandi Mealy, PA in our [facility].   Message to scheduler to get arranged

## 2020-09-06 NOTE — Assessment & Plan Note (Signed)
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 Anti Her 2 therapy with Lapatinibstarted 09/17/2017-05/14/2019: Stopped for progression 3.I discontinuedtamoxifen and started her on letrozole 2.5 mg daily.05/14/2019 stopped for progression 4.Stereotactic radiosurgery 12/19/2017 to the new right parietal lobe metastases. 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 Overall tolerating it very well.  Severe anemia: Asymptomatic we will watch. Previously received blood transfusion Chronic dry cough: I renewed a prescription for cough syrup MRI of the brain 08/13/20: Mixed treatment response with Dec in 3 lesions and multiple new lesions . Whole-body scans can  be done every 6 months.  Return to clinic every 3 weeks for Herceptin treatments. I will see her in 6 weeks with labs and follow-up.  

## 2020-09-06 NOTE — Progress Notes (Signed)
 Patient Care Team: Patient, [name] as PCP - General (General Practice) Mortenson, Ashley, MD as Referring Physician (Emergency Medicine)  DIAGNOSIS:    ICD-10-CM   1. Liver metastases (HCC)  C78.7 CT CHEST ABDOMEN PELVIS W CONTRAST  2. Malignant neoplasm of upper-inner quadrant of left breast in female, estrogen receptor negative (HCC)  C50.212 CT CHEST ABDOMEN PELVIS W CONTRAST   Z17.1 chlorpheniramine-HYDROcodone (TUSSIONEX) 10-8 MG/5ML SUER  3. Brain metastasis (HCC)  C79.31 CT CHEST ABDOMEN PELVIS W CONTRAST  4. 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 Ki-67 was 70% HER-2 was amplified with a ratio 2.91   07/12/2012 - 07/17/2013 Neo-Adjuvant Chemotherapy   TCH 6 followed by Herceptin maintenance   12/11/2012 Surgery   Left breast lumpectomy: 1.8 cm tumor 1 positive sentinel node, axillary lymph node dissection 02/08/2013 showed 0/13 lymph nodes   03/25/2013 - 05/06/2013 Radiation Therapy   Adjuvant radiation therapy   06/05/2013 - 07/20/2017 Anti-estrogen oral therapy   Tamoxifen 20 mg daily   07/27/2017 Relapse/Recurrence   MRI Brain: 3.4 x 2.9 x 2.9 cm RIGHT frontal lobe mass with imaging characteristics of solitary metastasis. Extensive vasogenic edema resulting in 9 mm RIGHT to LEFT midline shift. Equivocal very early LEFT ventricle entrapment.    08/04/2017 Surgery   Rt frontal brain resection: Poorly differentiated tumor IHC suggests breast primary ER and PR Positive   08/25/2017 - 09/04/2017 Radiation Therapy   Stereotactic radiation   09/18/2017 -  Anti-estrogen oral therapy   Lapatinib with letrozole   12/18/2017 - 12/19/2017  Radiation Therapy   New right parietal lobe metastases status post SRS   08/27/2018 - 08/27/2020 Radiation Therapy   SRS to new brain metastases   05/09/2019 Relapse/Recurrence   Interval increase in size of the enhancing nodular left internal mammary soft tissue 2.8 cm.  Redemonstrated enlarged supraclavicular, lower cervical and lower posterior cervical nodes unchanged.  Interval increase in the bony erosion of the posterior and lateral left third rib, increasing soft tissue lesion eroding the left sternal body 3.1 cm was 2.5 cm.  Bronchiectatic changes   06/19/2019 - 05/05/2020 Chemotherapy   The patient had ado-trastuzumab emtansine (KADCYLA) 180 mg in sodium chloride 0.9 % 250 mL chemo infusion, 3.6 mg/kg = 180 mg, Intravenous, Once, 16 of 16 cycles Administration: 180 mg (06/19/2019), 180 mg (07/10/2019), 180 mg (09/17/2019), 180 mg (08/05/2019), 180 mg (08/27/2019), 180 mg (10/08/2019), 180 mg (10/29/2019), 180 mg (11/19/2019), 200 mg (12/10/2019), 200 mg (12/31/2019), 200 mg (01/20/2020), 200 mg (02/11/2020), 200 mg (03/03/2020), 200 mg (03/24/2020), 200 mg (04/14/2020), 200 mg (05/05/2020)  for chemotherapy treatment.    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      CHIEF COMPLIANT: Follow-up of  metastatic breast cancer  INTERVAL HISTORY: Melissa [name] is a 54 y.o. with above-mentioned history of metastatic breast cancer with brain metastasiss/presection who is currently ontreatment withTucatinib with Xeloda and Herceptin.Brain MRI on 08/13/20 showed mixed response, with decrease in the size of three lesions and multiple new lesions. She presents to the clinic todayfortreatment.    ALLERGIES:  is allergic to aspirin, protonix [pantoprazole], and iodinated diagnostic agents.  MEDICATIONS:  Current Outpatient Medications  Medication Sig Dispense Refill  . albuterol (VENTOLIN HFA) 108 (90 Base) MCG/ACT inhaler Inhale 2 puffs into the lungs every 6 (six) hours as needed. 8 g 5  . budesonide-formoterol (SYMBICORT) 160-4.5 MCG/ACT inhaler Inhale 2 puffs into the lungs 2 (two) times daily. 3 Inhaler 3  . capecitabine (XELODA) 500 MG tablet Take 2 tablets (1,000 mg total) by mouth 2 (two) times daily after a meal. Take 2 tabs in AM and 1 tab in PM for 14 days on and 7 days off 56 tablet 3  . chlorpheniramine-HYDROcodone (TUSSIONEX) 10-8 MG/5ML SUER Take 5 mLs by mouth at bedtime. 473 mL 0  . levalbuterol (XOPENEX) 0.63 MG/3ML nebulizer solution Take 3 mLs (0.63 mg total) by nebulization every 4 (four) hours as needed for wheezing or shortness of breath. 540 mL 6  . lidocaine-prilocaine (EMLA) cream APPLY EXTERNALLY TO THE AFFECTED AREA 1 TIME 30 g 3  . naproxen sodium (ALEVE) 220 MG tablet Take 440 mg by mouth 2 (two) times daily as needed (headache).     . omeprazole (PRILOSEC) 40 MG capsule TAKE 1 CAPSULE(40 MG) BY MOUTH DAILY 30 capsule 3  . predniSONE (DELTASONE) 50 MG tablet Take 1 tablet 13 hours, 7 hours, and 1 hour prior to scans. 6 tablet 0  . tucatinib (TUKYSA) 150 MG tablet Take 2 tablets (300 mg total) by mouth in the morning and at bedtime. Take every 12 hrs at the same time each day with or without a meal. 120 tablet 3  . voriconazole (VFEND) 200 MG tablet Take 1 tablet (200  mg total) by mouth 2 (two) times daily. 60 tablet 11   No current facility-administered medications for this visit.   Facility-Administered Medications Ordered in Other Visits  Medication Dose Route Frequency Provider Last Rate Last Admin  . sodium chloride flush (NS) 0.9 % injection 10 mL  10 mL Intracatheter PRN Gudena, Vinay, MD   10 mL at 09/07/20 1024    PHYSICAL EXAMINATION: ECOG PERFORMANCE STATUS: 1 - Symptomatic but completely ambulatory  Vitals:   09/07/20 1056  BP: 109/60  Pulse: 93  Resp: 20  Temp: 98.4 F (36.9 C)  SpO2: 100%   Filed Weights   09/07/20 1056  Weight: 104 lb 3.2 oz (47.3 kg)    LABORATORY DATA:  I have reviewed the data as listed CMP Latest Ref Rng & Units 08/31/2020 07/28/2020 06/15/2020  Glucose 70 - 99 mg/dL 102(H) 91 115(H)  BUN 6 - 20 mg/dL 17 15 11  Creatinine 0.44 - 1.00 mg/dL 1.08(H) 0.85 0.99  Sodium 135 - 145 mmol/L 138 138 139  Potassium 3.5 - 5.1 mmol/L 4.1 4.0 3.6  Chloride 98 - 111 mmol/L 105 106 107  CO2 22 - 32 mmol/L 25 23 26  Calcium 8.9 - 10.3 mg/dL 9.4 9.5 9.4  Total Protein   6.5 - 8.1 g/dL 7.6 7.7 7.5  Total Bilirubin 0.3 - 1.2 mg/dL 0.2(L) 0.3 <0.2(L)  Alkaline Phos 38 - 126 U/L 161(H) 133(H) 123  AST 15 - 41 U/L _0 ALT 0 - 44 U/L _1 Lab Results  Component Value Date   WBC 9.0 09/07/2020   HGB 9.2 (L) 09/07/2020   HCT 28.5 (L) 09/07/2020   MCV 95.6 09/07/2020   PLT 282 09/07/2020   NEUTROABS 5.7 09/07/2020    ASSESSMENT & PLAN:  Breast cancer of upper-inner quadrant of left female breast (HCC) 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 Anti Her 2 therapy with Lapatinibstarted 09/17/2017-05/14/2019: Stopped for progression 3.I discontinuedtamoxifen and started her on letrozole 2.5 mg daily.05/14/2019 stopped for progression 4.Stereotactic radiosurgery 12/19/2017 to the new right parietal lobe metastases. 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 Overall tolerating it very well.  Severe anemia: Asymptomatic we will watch. Previously received blood transfusion.  Today's hemoglobin is 9.2 Chronic dry cough: I renewed a prescription for cough syrup . Whole-body scans can be done e in 3 months  Return to clinic every 3 weeks for Herceptin treatments. I will see her in 6 weeks with labs and follow-up.     Orders Placed This Encounter  Procedures  . CT CHEST ABDOMEN PELVIS W CONTRAST    Standing Status:   Future    Standing Expiration Date:   09/07/2021    Order Specific Question:   If indicated for  the ordered procedure, I authorize the administration of contrast media per Radiology protocol    Answer:   Yes    Order Specific Question:   Is patient pregnant?    Answer:   No    Order Specific Question:   Preferred imaging location?    Answer:   [facility]    Order Specific Question:   Release to patient    Answer:   Immediate    Order Specific Question:   Is Oral Contrast requested for this exam?    Answer:   Yes, Per Radiology protocol    Order Specific Question:   Reason for Exam (SYMPTOM  OR DIAGNOSIS REQUIRED)    Answer:   Met breast cancer restaging   The patient has a good understanding of the overall plan. she agrees with it. she will call with any problems that may develop before the next visit here.  Total time spent: 30 mins including face to face time and time spent for planning, charting and coordination of care  Rulon Eisenmenger, MD, MPH 09/07/2020  I, Molly Dorshimer, am acting as scribe for Dr. Nicholas Lose.  I have reviewed the above documentation for accuracy and completeness, and I agree with the above.

## 2020-09-07 ENCOUNTER — Other Ambulatory Visit: Payer: Self-pay

## 2020-09-07 ENCOUNTER — Telehealth: Payer: Self-pay

## 2020-09-07 ENCOUNTER — Inpatient Hospital Stay: Payer: PRIVATE HEALTH INSURANCE

## 2020-09-07 ENCOUNTER — Inpatient Hospital Stay (HOSPITAL_BASED_OUTPATIENT_CLINIC_OR_DEPARTMENT_OTHER): Payer: PRIVATE HEALTH INSURANCE | Admitting: Hematology and Oncology

## 2020-09-07 VITALS — BP 109/60 | HR 93 | Temp 98.4°F | Resp 20 | Ht 62.0 in | Wt 104.2 lb

## 2020-09-07 DIAGNOSIS — C50212 Malignant neoplasm of upper-inner quadrant of left female breast: Secondary | ICD-10-CM

## 2020-09-07 DIAGNOSIS — C7931 Secondary malignant neoplasm of brain: Secondary | ICD-10-CM | POA: Diagnosis not present

## 2020-09-07 DIAGNOSIS — Z171 Estrogen receptor negative status [ER-]: Secondary | ICD-10-CM

## 2020-09-07 DIAGNOSIS — Z95828 Presence of other vascular implants and grafts: Secondary | ICD-10-CM

## 2020-09-07 DIAGNOSIS — Z5112 Encounter for antineoplastic immunotherapy: Secondary | ICD-10-CM | POA: Diagnosis not present

## 2020-09-07 DIAGNOSIS — C50912 Malignant neoplasm of unspecified site of left female breast: Secondary | ICD-10-CM | POA: Diagnosis not present

## 2020-09-07 DIAGNOSIS — C787 Secondary malignant neoplasm of liver and intrahepatic bile duct: Secondary | ICD-10-CM | POA: Diagnosis not present

## 2020-09-07 LAB — CMP (CANCER CENTER ONLY)
ALT: 15 U/L (ref 0–44)
AST: 31 U/L (ref 15–41)
Albumin: 3.4 g/dL — ABNORMAL LOW (ref 3.5–5.0)
Alkaline Phosphatase: 179 U/L — ABNORMAL HIGH (ref 38–126)
Anion gap: 6 (ref 5–15)
BUN: 11 mg/dL (ref 6–20)
CO2: 25 mmol/L (ref 22–32)
Calcium: 9.2 mg/dL (ref 8.9–10.3)
Chloride: 109 mmol/L (ref 98–111)
Creatinine: 1.14 mg/dL — ABNORMAL HIGH (ref 0.44–1.00)
GFR, Estimated: 58 mL/min — ABNORMAL LOW (ref >60)
Glucose, Bld: 108 mg/dL — ABNORMAL HIGH (ref 70–99)
Potassium: 4.3 mmol/L (ref 3.5–5.1)
Sodium: 140 mmol/L (ref 135–145)
Total Bilirubin: 0.4 mg/dL (ref 0.3–1.2)
Total Protein: 7.3 g/dL (ref 6.5–8.1)

## 2020-09-07 LAB — CBC WITH DIFFERENTIAL (CANCER CENTER ONLY)
Abs Immature Granulocytes: 0.02 10*3/uL (ref 0.00–0.07)
Basophils Absolute: 0.1 10*3/uL (ref 0.0–0.1)
Basophils Relative: 1 %
Eosinophils Absolute: 0.9 10*3/uL — ABNORMAL HIGH (ref 0.0–0.5)
Eosinophils Relative: 10 %
HCT: 28.5 % — ABNORMAL LOW (ref 36.0–46.0)
Hemoglobin: 9.2 g/dL — ABNORMAL LOW (ref 12.0–15.0)
Immature Granulocytes: 0 %
Lymphocytes Relative: 15 %
Lymphs Abs: 1.4 10*3/uL (ref 0.7–4.0)
MCH: 30.9 pg (ref 26.0–34.0)
MCHC: 32.3 g/dL (ref 30.0–36.0)
MCV: 95.6 fL (ref 80.0–100.0)
Monocytes Absolute: 1 10*3/uL (ref 0.1–1.0)
Monocytes Relative: 11 %
Neutro Abs: 5.7 10*3/uL (ref 1.7–7.7)
Neutrophils Relative %: 63 %
Platelet Count: 282 10*3/uL (ref 150–400)
RBC: 2.98 MIL/uL — ABNORMAL LOW (ref 3.87–5.11)
RDW: 29 % — ABNORMAL HIGH (ref 11.5–15.5)
WBC Count: 9 10*3/uL (ref 4.0–10.5)
nRBC: 0 % (ref 0.0–0.2)

## 2020-09-07 MED ORDER — ACETAMINOPHEN 325 MG PO TABS
ORAL_TABLET | ORAL | Status: AC
  Filled 2020-09-07: qty 2

## 2020-09-07 MED ORDER — SODIUM CHLORIDE 0.9 % IV SOLN
Freq: Once | INTRAVENOUS | Status: AC
  Filled 2020-09-07: qty 250

## 2020-09-07 MED ORDER — SODIUM CHLORIDE 0.9% FLUSH
10.0000 mL | INTRAVENOUS | Status: DC | PRN
Start: 2020-09-07 — End: 2020-09-07
  Administered 2020-09-07: 10 mL
  Filled 2020-09-07: qty 10

## 2020-09-07 MED ORDER — ALTEPLASE 2 MG IJ SOLR
2.0000 mg | Freq: Once | INTRAMUSCULAR | Status: AC | PRN
  Administered 2020-09-07: 2 mg
  Filled 2020-09-07: qty 2

## 2020-09-07 MED ORDER — TRASTUZUMAB-DKST CHEMO 150 MG IV SOLR
300.0000 mg | Freq: Once | INTRAVENOUS | Status: AC
  Administered 2020-09-07: 300 mg via INTRAVENOUS
  Filled 2020-09-07: qty 14.29

## 2020-09-07 MED ORDER — HEPARIN SOD (PORK) LOCK FLUSH 100 UNIT/ML IV SOLN
500.0000 [IU] | Freq: Once | INTRAVENOUS | Status: AC | PRN
  Administered 2020-09-07: 500 [IU]
  Filled 2020-09-07: qty 5

## 2020-09-07 MED ORDER — DIPHENHYDRAMINE HCL 25 MG PO CAPS
ORAL_CAPSULE | ORAL | Status: AC
  Filled 2020-09-07: qty 1

## 2020-09-07 MED ORDER — ACETAMINOPHEN 325 MG PO TABS
650.0000 mg | ORAL_TABLET | Freq: Once | ORAL | Status: AC
  Administered 2020-09-07: 650 mg via ORAL

## 2020-09-07 MED ORDER — ALTEPLASE 2 MG IJ SOLR
INTRAMUSCULAR | Status: AC
  Filled 2020-09-07: qty 2

## 2020-09-07 MED ORDER — HYDROCOD POLST-CPM POLST ER 10-8 MG/5ML PO SUER: 5.0000 mL | Freq: Every day | ORAL | 0 refills | Status: DC

## 2020-09-07 MED ORDER — DIPHENHYDRAMINE HCL 25 MG PO CAPS
25.0000 mg | ORAL_CAPSULE | Freq: Once | ORAL | Status: AC
  Administered 2020-09-07: 25 mg via ORAL

## 2020-09-07 MED ORDER — CAPECITABINE 500 MG PO TABS: 1000.0000 mg | ORAL_TABLET | Freq: Two times a day (BID) | ORAL | 3 refills | Status: DC

## 2020-09-07 MED FILL — CAPECITABINE 500 MG TABLET: 500 | 21 days supply | Qty: 56 | Fill #3

## 2020-09-07 MED FILL — TUKYSA 150 MG TABS: 150 | 30 days supply | Qty: 120 | Fill #3

## 2020-09-07 NOTE — Patient Instructions (Signed)
[facility] °Discharge Instructions for Patients Receiving Chemotherapy ° °Today you received the following chemotherapy agents Trastuzumab ° °To help prevent nausea and vomiting after your treatment, we encourage you to take your nausea medication as directed. °  °If you develop nausea and vomiting that is not controlled by your nausea medication, call the clinic.  ° °BELOW ARE SYMPTOMS THAT SHOULD BE REPORTED IMMEDIATELY: °· *FEVER GREATER THAN 100.5 F °· *CHILLS WITH OR WITHOUT FEVER °· NAUSEA AND VOMITING THAT IS NOT CONTROLLED WITH YOUR NAUSEA MEDICATION °· *UNUSUAL SHORTNESS OF BREATH °· *UNUSUAL BRUISING OR BLEEDING °· TENDERNESS IN MOUTH AND THROAT WITH OR WITHOUT PRESENCE OF ULCERS °· *URINARY PROBLEMS °· *BOWEL PROBLEMS °· UNUSUAL RASH °Items with * indicate a potential emergency and should be followed up as soon as possible. ° °Feel free to call the clinic should you have any questions or concerns. The clinic phone number is [phone]. ° °Please show the CHEMO ALERT CARD at check-in to the Emergency Department and triage nurse. ° ° °

## 2020-09-07 NOTE — Telephone Encounter (Signed)
Pt came in today for port flush and labs. Port produced a flash of blood then stopped. Peripheral labs drawn. Cathflo placed by Artemio Aly RN

## 2020-09-07 NOTE — Progress Notes (Signed)
  Radiation Oncology         [phone] ________________________________  Name: MAMIE HUNDERTMARK MRN: [id number]  Date: 08/27/2020  DOB: [birth year]  End of Treatment Note  Diagnosis:     55 y.o.female with six new brain metastases secondary to Stage IV, triple positive, invasive ductal carcinoma of the left breast.     Indication for treatment:  Palliation       Radiation treatment dates:   08/27/20  Site/dose/beams/energy:     [name] received stereotactic radiosurgery to the following targets:   These five targets were treated using 8 Rapid Arc VMAT Beams to a prescription dose of 20 Gy.  ExacTrac registration was performed for each couch angle.  The 100% isodose line was prescribed.  6 MV X-rays were delivered in the flattening filter free beam mode.  Narrative: The patient tolerated radiation treatment relatively well.     Plan: The patient has completed radiation treatment. The patient will return to radiation oncology clinic for routine followup in one month. I advised her to call or return sooner if she has any questions or concerns related to her recovery or treatment. ________________________________  Sheral Apley. Tammi Klippel, M.D.

## 2020-09-09 ENCOUNTER — Telehealth: Payer: Self-pay | Admitting: Hematology and Oncology

## 2020-09-09 NOTE — Telephone Encounter (Signed)
Scheduled per 2/28 los. Pt will receive an updated appt calendar per next visit appt notes

## 2020-09-21 ENCOUNTER — Other Ambulatory Visit: Payer: Self-pay | Admitting: Hematology and Oncology

## 2020-09-22 ENCOUNTER — Encounter: Payer: Self-pay | Admitting: Urology

## 2020-09-23 ENCOUNTER — Encounter: Payer: Self-pay | Admitting: Urology

## 2020-09-24 ENCOUNTER — Ambulatory Visit
Admission: RE | Admit: 2020-09-24 | Discharge: 2020-09-24 | Disposition: A | Discharge status: Home / Self Care | Payer: PRIVATE HEALTH INSURANCE | Source: Ambulatory Visit | Attending: Radiation Oncology | Admitting: Radiation Oncology

## 2020-09-24 ENCOUNTER — Other Ambulatory Visit: Payer: Self-pay

## 2020-09-24 DIAGNOSIS — C50912 Malignant neoplasm of unspecified site of left female breast: Secondary | ICD-10-CM | POA: Insufficient documentation

## 2020-09-24 DIAGNOSIS — C7931 Secondary malignant neoplasm of brain: Secondary | ICD-10-CM | POA: Insufficient documentation

## 2020-09-24 NOTE — Progress Notes (Signed)
Radiation Oncology         [phone] ________________________________  Name: Melissa [name] MRN: [id number]  Date: 09/24/2020  DOB: 13-Jul-1965  Post Treatment Note  CC: Patient, [name]  [name], [name], *  Diagnosis:   55 y.o. 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  Interval Since Last Radiation:  1 month 08/27/20: These five targets were treated to a prescription dose of 20 G in a single fraction.     05/13/20:  SRS brain to the following 4 targets treated to a prescription dose of 20 Gy in a single fraction with a single isocenter: PTV6 Mid Cerebellum 59mm PTV7 Lt Occipital 32mm PTV8 Lt Occipital 46mm PTV9 Lt Temporal 28mm   03/25/2019:  SRS brain//PTV 5:  Right frontal 5 mm target was treated to a prescription dose of 20 Gy in a single fraction.   09/04/2018:   SRS Brain// Right Frontal, 2 targets / 20 Gy in 1 fraction PTV3: Ant Rt Frontal 13mm 20Gy PTV4: Rt Frontal resection cavity 48mm  20Gy  12/18/2017: SRS brain//PTV2: 4 mm Rt Parietal lesion treated to 20 Gy in 1 Fx  08/25/2017, 08/28/2017, 08/30/2017, 09/01/2017, 09/04/2017: PTV1: post op SRS to right frontal lobe resection cavity in 5 fxs  Narrative:  In summary, she initially presented to the emergency Department on 07/27/2017 with complaints of headaches ongoing for approximately 5 weeks with associated nausea and vomiting. She was initially treated by her PCP for sinusitis without improvement.  She also has a history of migraine headaches and had ben evaluated in the ED at [facility] on 06/20/2017 for treatment of what she thought was a typical migraine headache given the fact that she had her usual blurry vision and aura preceding the headache. She reports that the headache did improve with treatment but the relief was short-lived as the headache returned the very next day. She followed up with her primary care physician and a CT of  the head was going to be scheduled due to the persistent headaches but in the interim, she presented to the Emergency Department at [facility] due to increased severity of the headache with associated nausea and vomiting. The headaches were occuring in the frontal lobe as well as bilateral temporal with occasional radiation to the occipital lobe and associated with blurry vision, decreased appetite, fatigue, nausea and vomiting.  She denied any difficulty with speech, memory, imbalance or focal weakness.  CT Head on admission 07/27/2017 showed a large right frontal lobe mass measuring 3.1 by 2.5 cm with significant surrounding edema with mass effect and right to left midline shift measuring 8 mm. A subsequent Brain MRI showed a 3.4 x 2.9 x 2.9 cm right frontal lobe mass with imaging characteristics of solitary metastasis and extensive vasogenic edema resulting in 9 mm right to left midline shift as well as equivocal very early left ventricle entrapment.   CT Chest, Abdomen, Pelvis on 07/28/2017 for disease staging showed no findings to suggest metastatic breast cancer involving the chest, abdomen, pelvis or osseous structures. 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 Surgical changes involving the left breast and left axilla but no findings for local recurrence or regional adenopathy.  She proceeded with a gross total resection of the frontal lesion on 08/05/17 with Dr. Cyndy Freeze followed by adjuvant fractionated postop SRS radiotherapy to the resection cavity in February 2019. Final surgical pathology revealed poorly  differentiated adenocarcinoma consistent with metastatic breast cancer, ER/PR positive and HER-2 positive.   She tolerated SRS treatment well and initial post treatment MRI brain on 12/07/17 showed satisfactory appearance of the right anterior frontal lobe post treatment site with expected evolution but unfortunately also showed a single, new 2 - 3 mm  lesion in the right parietal lobe without associated edema or mass-effect.  She elected to undergo salvage SRS treatment to the new lesion in the parietal lobe which was completed on 12/18/17 and tolerated well.  A follow up brain MRI on 05/18/18 showed a stable right frontal resection cavity with stable 4 mm nodular enhancement at the inferior cavity margin but no evidence of new disease.  Unfortunately, the follow up MRI brain scan on 08/23/2018 showed a new 11 mm inferior right frontal lesion with only mild associated edema and no mass-effect.  She reported that she had been having more frequent frontal headaches which were not severe and responded to Aleve and otherwise, had been feeling well.  She elected to proceed with a single fraction of SRS to this new brain metastasis as recommended and this was completed on  09/04/18 and tolerated very well.   She had bronchoscopy with Dr. Valeta Harms in 05/2018 and dx'ed with Aspergillus- ID following (Dr. Graylon Good).  She also continues in routine follow up with Dr. Melvyn Novas in Pulmonology.  Follow up MRI brain scan from 03/08/19 showed continued interval enlargement of the nodular focus with enhancement at the superior margin of the right frontal resection cavity, measuring 5 x 6 mm in diameter compared with 3 x 4.5 mm on the previous study. Two smaller foci of enhancement along the inferior margin were unchanged and there was no evidence of increasing mass effect or edema and no new lesions seen elsewhere within the brain.  She elected to proceed with SRS treatment of the progressive nodular lesion which was completed on 03/25/19 and tolerated well without any acute ill effects.    She developed systemic disease progression, particularly in the chest with increase in size of enhancing nodular left internal mammary soft tissue mass, lymph nodes and left third rib lesion and soft tissue lesion eroding the left sternal body as well as a new, hypermetabolic 6.5 cm liver lesion seen on  PET scan from 05/23/2019 despite continuing onlapatinibwith Letrozole. She did have an ultrasound-guided biopsy of the liver mass on 06/05/2019 which confirmed metastatic breast cancer, HER-2 positive, ER/PR negative. Her systemic therapy was changed to [place] beginning on 06/19/2019, under the care and direction of Dr. Lindi Adie which she tolerated very well.  A follow-up CT chest from 07/19/2019 showed a positive response to treatment with decrease in size of the soft tissue mass in the breast as well as bony lesions and adenopathy.  Fortunately, her follow-up MRI brain from 08/02/2019 showed stability of the previously treated disease and no new or progressive findings. A repeat MRI brain scan 11/04/19 was felt to be overall stable but there was a slight change in a previously treated right inferior frontal lesion, measuring 5 mm, previously 4 mm, with a new, separate punctate focus of enhancement just posterior to that. This was such minimal change and when her prior treatment fields were fused with recent scan, it appeared that the new area of enhancement was on the field border of GTV3 and would have received a large portion of the delivered dose.  Therefore, consensus recommendation was to simply monitor this area with routine 3 month follow up brain imaging. She  had repeat MRI on 01/30/20 and this showed overall disease stability with an unchanged appearance of the 2 previously treated right frontal lobe deposits and no new or progressive disease. Repeat systemic disease staging scans were performed on 01/29/20 and 02/04/20.  The bone scan on 01/29/20 showed less uptake of tracer at the previously identified sternal and LEFT third rib lesions, consistent with response to therapy and there were no new sites of osseous metastasis noted. The CT C/A/P performed on 02/04/20 also showed disease stability with a new cluster of spiculated nodule or opacities in the posterior left lung favoring aspergillosis or other atypical  infectious etiology.  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  There was no definite evidence of metastatic disease within the abdomen or pelvis. She continued on Kadcycla, s/p 14 cycles as of 05/05/20.   Repeat MRI brain from 05/01/20 showed 4 new metastatic lesions, all subcentimeter with a 23mm midline cerebellar vermian metastasis, 43mm left occipital metastasis, 7mm right occipital metastasis and a punctate lesion in the left temporal lobe.  The concensus recommendation was to proceed with a single fraction of SRS to the 4 new metastases which she was in agreement with and was completed on 05/13/21.  Her restaging systemic imaging from 05/28/20 was without evidence of any disease progression or recurrence in the chest, abdomen or pelvis.  At the time of her follow up with Dr. Lindi Adie on 05/05/2020, the decision was made to discontinue the Kadcycla, due to disease progression in the brain, and switch to Tucatinib, oral systemic therapy in combination with Herceptin and Xeloda which was started 06/11/20.  Her follow up brain MRI from 08/13/20 unfortunately showed disease progression with 5 new lesions: with a 2 mm left frontal lesion, 2 mm right frontal lesion, 2 mm right occipital lesion, 2 mm right temporal lesion and a 5 mm left temporal lesion.  The 1 mm  The right occipital lesion was initially thought to be new but on further review of prior treatment plans, it was noted to have been treated previously and stable.  Otherwise, the other previously treated lesions were improved and/or stable.  The consensus recommendation at brain conference was to proceed with a single fraction of SRS to the 5 new brain lesions which she was in agreement with. Her most recent [place] treatment was completed 08/27/20 and tolerated very well without any ill side effects aside  from some low grade nausea for the first 48 hours but has since resolved completely.     Interval History:   I spoke with the patient to conduct her routine scheduled 1 month follow up visit via telephone to spare the patient unnecessary potential exposure in the healthcare setting during the current COVID-19 pandemic.  The patient was notified in advance and gave permission to proceed with this visit format. She reports that she has remained clinically stable since her last treatment and is currently without complaints.  She has continued to tolerate her systemic therapy fairly well aside from fatigue.    On review of systems, the patient states that she is doing well overall.  She is without complaints aside from fatigue and chronic cough.  She has continued with improved energy levels and has been able to continue to work and has recently moved her parents to a retirement facility. She denies headaches, decreased visual or auditory acuity,  tinnitus, tremor, focal weakness or seizure activity. She is not having difficulty with speech or word finding.  She denies abdominal pain, N/V or diarrhea.  She continues on systemic therapy with Xeloda, Herceptin, Tucatinib and is tolerating this well. She reports a healthy appetite and is maintaining her weight which she is pleased with.  ALLERGIES:  is allergic to aspirin, protonix [pantoprazole], and iodinated diagnostic agents.  Meds: Current Outpatient Medications  Medication Sig Dispense Refill  . albuterol (VENTOLIN HFA) 108 (90 Base) MCG/ACT inhaler Inhale 2 puffs into the lungs every 6 (six) hours as needed. 8 g 5  . budesonide-formoterol (SYMBICORT) 160-4.5 MCG/ACT inhaler Inhale 2 puffs into the lungs 2 (two) times daily. 3 Inhaler 3  . capecitabine (XELODA) 500 MG tablet Take 2 tablets (1,000 mg total) by mouth 2 (two) times daily after a meal. Take 2 tabs in AM and 1 tab in PM for 14 days on and 7 days off 56 tablet 3  .  chlorpheniramine-HYDROcodone (TUSSIONEX) 10-8 MG/5ML SUER Take 5 mLs by mouth at bedtime. 473 mL 0  . levalbuterol (XOPENEX) 0.63 MG/3ML nebulizer solution Take 3 mLs (0.63 mg total) by nebulization every 4 (four) hours as needed for wheezing or shortness of breath. 540 mL 6  . lidocaine-prilocaine (EMLA) cream APPLY EXTERNALLY TO THE AFFECTED AREA 1 TIME 30 g 3  . naproxen sodium (ALEVE) 220 MG tablet Take 440 mg by mouth 2 (two) times daily as needed (headache).     Marland Kitchen omeprazole (PRILOSEC) 40 MG capsule TAKE 1 CAPSULE(40 MG) BY MOUTH DAILY 30 capsule 3  . predniSONE (DELTASONE) 50 MG tablet Take 1 tablet 13 hours, 7 hours, and 1 hour prior to scans. 6 tablet 0  . tucatinib (TUKYSA) 150 MG tablet Take 2 tablets (300 mg total) by mouth in the morning and at bedtime. Take every 12 hrs at the same time each day with or without a meal. 120 tablet 3  . voriconazole (VFEND) 200 MG tablet Take 1 tablet (200 mg total) by mouth 2 (two) times daily. 60 tablet 11   No current facility-administered medications for this encounter.    Physical Findings:  vitals were not taken for this visit.   /Unable to assess due to telephone follow-up visit format.   Lab Findings: Lab Results  Component Value Date   WBC 9.0 09/07/2020   HGB 9.2 (L) 09/07/2020   HCT 28.5 (L) 09/07/2020   MCV 95.6 09/07/2020   PLT 282 09/07/2020     Radiographic Findings: No results found.  Impression/Plan: 69. 55 y.o. 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 She appears to have recovered well form the effects of radiotherapy and is currently without complaints.  We discussed the plan to repeat MRI brain scan in approximately 2 months to assess her treatment response and pending this scan is stable, we will resume surveillance imaging every 3 months going forward.  I will contact her by phone following each scan to review results and  recommendations from brain tumor board but she knows that she is welcome to call at anytime in the interim with any questions or concerns.  She will also continue in routine follow-up under the care and direction of Dr. Quintella Reichert for continued management of her systemic disease.  Her next scheduled visit in medical oncology is on 10/20/2020 and her next systemic imaging is expected around Dec 05, 2020.   Nicholos Johns,  PA-C    Tyler Pita, MD  [facility] Direct Dial: [phone]  Fax: [phone] [email or web address]  Skype  LinkedIn

## 2020-09-28 ENCOUNTER — Other Ambulatory Visit: Payer: Self-pay | Admitting: Pharmacist

## 2020-09-28 ENCOUNTER — Other Ambulatory Visit: Payer: Self-pay

## 2020-09-28 ENCOUNTER — Other Ambulatory Visit: Payer: Self-pay | Admitting: *Deleted

## 2020-09-28 ENCOUNTER — Inpatient Hospital Stay: Payer: PRIVATE HEALTH INSURANCE | Attending: Hematology and Oncology

## 2020-09-28 ENCOUNTER — Encounter: Payer: Self-pay | Admitting: Pharmacist

## 2020-09-28 VITALS — BP 119/54 | HR 68 | Temp 98.2°F | Resp 20 | Wt 104.4 lb

## 2020-09-28 DIAGNOSIS — C787 Secondary malignant neoplasm of liver and intrahepatic bile duct: Secondary | ICD-10-CM | POA: Insufficient documentation

## 2020-09-28 DIAGNOSIS — C50212 Malignant neoplasm of upper-inner quadrant of left female breast: Secondary | ICD-10-CM

## 2020-09-28 DIAGNOSIS — C7951 Secondary malignant neoplasm of bone: Secondary | ICD-10-CM | POA: Diagnosis not present

## 2020-09-28 DIAGNOSIS — Z170 Estrogen receptor positive status [ER+]: Secondary | ICD-10-CM | POA: Diagnosis not present

## 2020-09-28 DIAGNOSIS — C7931 Secondary malignant neoplasm of brain: Secondary | ICD-10-CM | POA: Diagnosis not present

## 2020-09-28 DIAGNOSIS — Z79899 Other long term (current) drug therapy: Secondary | ICD-10-CM

## 2020-09-28 DIAGNOSIS — Z5181 Encounter for therapeutic drug level monitoring: Secondary | ICD-10-CM

## 2020-09-28 DIAGNOSIS — Z171 Estrogen receptor negative status [ER-]: Secondary | ICD-10-CM

## 2020-09-28 DIAGNOSIS — Z5112 Encounter for antineoplastic immunotherapy: Secondary | ICD-10-CM | POA: Insufficient documentation

## 2020-09-28 MED ORDER — SODIUM CHLORIDE 0.9 % IV SOLN
Freq: Once | INTRAVENOUS | Status: AC
  Filled 2020-09-28: qty 250

## 2020-09-28 MED ORDER — SODIUM CHLORIDE 0.9% FLUSH
10.0000 mL | INTRAVENOUS | Status: DC | PRN
  Administered 2020-09-28: 10 mL
  Filled 2020-09-28: qty 10

## 2020-09-28 MED ORDER — DIPHENHYDRAMINE HCL 25 MG PO CAPS
25.0000 mg | ORAL_CAPSULE | Freq: Once | ORAL | Status: AC
  Administered 2020-09-28: 25 mg via ORAL

## 2020-09-28 MED ORDER — ACETAMINOPHEN 325 MG PO TABS
650.0000 mg | ORAL_TABLET | Freq: Once | ORAL | Status: AC
  Administered 2020-09-28: 650 mg via ORAL

## 2020-09-28 MED ORDER — ACETAMINOPHEN 325 MG PO TABS
ORAL_TABLET | ORAL | Status: AC
  Filled 2020-09-28: qty 2

## 2020-09-28 MED ORDER — DIPHENHYDRAMINE HCL 25 MG PO CAPS
ORAL_CAPSULE | ORAL | Status: AC
  Filled 2020-09-28: qty 1

## 2020-09-28 MED ORDER — TRASTUZUMAB-DKST CHEMO 150 MG IV SOLR
300.0000 mg | Freq: Once | INTRAVENOUS | Status: AC
  Administered 2020-09-28: 300 mg via INTRAVENOUS
  Filled 2020-09-28: qty 14.29

## 2020-09-28 MED ORDER — HEPARIN SOD (PORK) LOCK FLUSH 100 UNIT/ML IV SOLN
500.0000 [IU] | Freq: Once | INTRAVENOUS | Status: AC | PRN
  Administered 2020-09-28: 500 [IU]
  Filled 2020-09-28: qty 5

## 2020-09-28 NOTE — Progress Notes (Signed)
Oncology Clinical Pharmacist Follow Up Note  Melissa [name] was contacted via in-person visit to discuss her chemotherapy regimen for capecitabine, tucatinib, trastuzumab-dkst every 21 days.   She continues on capecitabine 500 mg by mouth  every 12 hours on days 1 to 14 of a 21-day cycle, tucatinib 300 mg by mouth every 12 hours, and trastuzumab-dkst by IV every 21 days. Therapy is planned to continue until unacceptable toxicity or progression. Cycle 1, day 1 start date was 06/11/20. She is due to start her next cycle of capecitabine this eveing.  She did not take her morning dose today.  Response to Therapy Melissa [name] is doing well.  She is here today to receive her trastuzumab-dkst. She last saw Dr. Lindi Adie on 09/07/20 and at that time had mentioned being extremely fatigued.  Her dose was reduced for capecitabine and she now takes 1 tablet in the morning and 1 in the evening. Melissa [name] states that she has noticed a difference in her energy levels and feels the dose reduction has been helpful.  She does occasionally still get very tired all of a sudden, sometimes while eating.  She also reports some bruising on her hands and arms that she feels is likely due to her playing with her grandson and gardening.  She reports no other signs of bleeding at this time besides an occasional small amount of blood first thing in the morning when she blows her nose which she attributes to the dry air.  She does use a humidifier. She reports no other significant side effects at this time.  We reviewed proper fluid intake and signs to watch out for with regards to dehydration, bleeding and to call the clinic for any new or worsening symptoms and/or fever. She reports missing about 2 doses per month because she gets busy and forgets.  We reviewed some reminders she can try to implement. Dr. Lindi Adie reviewed labs with Melissa [name] on 09/07/20 and both patient and provider are in agreement to continue therapy at that time.  She has a follow up appointment with Dr. Lindi Adie on 10/20/20.  No labs were drawn today.  Allergies  Allergen Reactions  . Aspirin Anaphylaxis and Shortness Of Breath    THROAT CLOSES  . Protonix [Pantoprazole] Nausea Only and Other (See Comments)    Also caused a "film in the mouth" and caused chest pressure  . Iodinated Diagnostic Agents Rash    Patient allergic to contrast used in radiation oncology for CT simulation     Labs CBC EXTENDED Latest Ref Rng & Units 09/07/2020 08/31/2020 07/28/2020  WBC 4.0 - 10.5 K/uL 9.0 10.4 9.5  RBC 3.87 - 5.11 MIL/uL 2.98(L) 3.00(L) 3.21(L)  HGB 12.0 - 15.0 g/dL 9.2(L) 9.0(L) 8.9(L)  HCT 36.0 - 46.0 % 28.5(L) 27.0(L) 27.4(L)  PLT 150 - 400 K/uL 282 237 281  NEUTROABS 1.7 - 7.7 K/uL 5.7 6.5 6.1  LYMPHSABS 0.7 - 4.0 K/uL 1.4 2.5 1.9    CMP Latest Ref Rng & Units 09/07/2020 08/31/2020 07/28/2020  Glucose 70 - 99 mg/dL 108(H) 102(H) 91  BUN 6 - 20 mg/dL 11 17 15   Creatinine 0.44 - 1.00 mg/dL 1.14(H) 1.08(H) 0.85  Sodium 135 - 145 mmol/L 140 138 138  Potassium 3.5 - 5.1 mmol/L 4.3 4.1 4.0  Chloride 98 - 111 mmol/L 109 105 106  CO2 22 - 32 mmol/L 25 25 23   Calcium 8.9 - 10.3 mg/dL 9.2 9.4 9.5  Total Protein  6.5 - 8.1 g/dL 7.3 7.6 7.7  Total Bilirubin 0.3 - 1.2 mg/dL 0.4 0.2(L) 0.3  Alkaline Phos 38 - 126 U/L 179(H) 161(H) 133(H)  AST 15 - 41 U/L 31 21 21   ALT 0 - 44 U/L 15 11 13      Adverse Effects Assessment . Melissa [name] reports some bruising on her hands and arms as noted above . Fatigue improved  Adherence Assessment . Melissa [name] reports missing 2 doses over the past 4 weeks.   . Reason for missed dose: forgot . Patient was re-educated on importance of adherence.   Access Assessment . Melissa [name] is currently receiving her capecitabine and tucatinib through [facility]  . Insurance concerns:  none  Medication Reconciliation . The patient's medication list was reviewed today with the patient? No . New medications  or herbal supplements have recently been started? has been taking [place]. This has been added as a historical medication . Any medications have been discontinued? No  . The medication list was updated and reconciled based on the patient's most recent medication list in the electronic medical record (EMR) including herbal products and OTC medications.   Medications Current Outpatient Medications  Medication Sig Dispense Refill  . albuterol (VENTOLIN HFA) 108 (90 Base) MCG/ACT inhaler Inhale 2 puffs into the lungs every 6 (six) hours as needed. 8 g 5  . budesonide-formoterol (SYMBICORT) 160-4.5 MCG/ACT inhaler Inhale 2 puffs into the lungs 2 (two) times daily. 3 Inhaler 3  . capecitabine (XELODA) 500 MG tablet Take 2 tablets (1,000 mg total) by mouth 2 (two) times daily after a meal. Take 2 tabs in AM and 1 tab in PM for 14 days on and 7 days off 56 tablet 3  . chlorpheniramine-HYDROcodone (TUSSIONEX) 10-8 MG/5ML SUER Take 5 mLs by mouth at bedtime. 473 mL 0  . levalbuterol (XOPENEX) 0.63 MG/3ML nebulizer solution Take 3 mLs (0.63 mg total) by nebulization every 4 (four) hours as needed for wheezing or shortness of breath. 540 mL 6  . lidocaine-prilocaine (EMLA) cream APPLY EXTERNALLY TO THE AFFECTED AREA 1 TIME 30 g 3  . naproxen sodium (ALEVE) 220 MG tablet Take 440 mg by mouth 2 (two) times daily as needed (headache).     Marland Kitchen omeprazole (PRILOSEC) 40 MG capsule TAKE 1 CAPSULE(40 MG) BY MOUTH DAILY 30 capsule 3  . predniSONE (DELTASONE) 50 MG tablet Take 1 tablet 13 hours, 7 hours, and 1 hour prior to scans. 6 tablet 0  . tucatinib (TUKYSA) 150 MG tablet Take 2 tablets (300 mg total) by mouth in the morning and at bedtime. Take every 12 hrs at the same time each day with or without a meal. 120 tablet 3  . voriconazole (VFEND) 200 MG tablet Take 1 tablet (200 mg total) by mouth 2 (two) times daily. 60 tablet 11   No current facility-administered medications for this visit.    Facility-Administered Medications Ordered in Other Visits  Medication Dose Route Frequency Provider Last Rate Last Admin  . heparin lock flush 100 unit/mL  500 Units Intracatheter Once PRN Melissa Lose, MD      . sodium chloride flush (NS) 0.9 % injection 10 mL  10 mL Intracatheter PRN Melissa Lose, MD      . trastuzumab-dkst (OGIVRI) 300 mg in sodium chloride 0.9 % 250 mL chemo infusion  300 mg Intravenous Once Melissa Lose, MD 528.6 mL/hr at 09/28/20 1112 300 mg at 09/28/20 1112    Drug-Drug Interactions (DDIs) . DDIs  were evaluated? Yes . Significant DDIs? No  . The patient was instructed to speak with their health care provider and/or the oral chemotherapy pharmacist before starting any new drug, including prescription or over the counter, natural / herbal products, or vitamins.  Supportive Care . Reviewed clinic number to call should she have any concerns/questions . Discussed proper fluid intake and how to monitor urine output . Reviewed signs of bleeding such as blood in the urine, stool, gums, nose and when to call the clinic. Current brief blood in morning in nose when she blows likely due to dry air.  Platelets WNL at last visit.  Patient Education/Re-education The patient was educated regarding the following supportive care topics:   . Fever . Bleeding . Fatigue . Diet  Additional education included: none at this time  Dosing Assessment . Hepatic adjustments needed? No  . Renal adjustments needed? No  . Toxicity adjustments needed? No  . The current dosing regimen is appropriate to continue at this time.  Follow-Up Plan . She will see Dr. Lindi Adie in 3 weeks . Ms. Dobbin will call clinic sooner with any questions/concerns  Recommendations for next cycle . Continue on current dosing regimen at this time.  The patient was encouraged to call [phone] with questions or concerns.  Patient verbalized understanding of the information provided.   I spent 15  minutes assessing and educating the patient.  Raina Mina, [place], 09/28/2020  11:27 AM

## 2020-09-28 NOTE — Progress Notes (Signed)
Per MD okay to treat with echo from 06/10/20.  Orders received from MD to schedule echo within the next week.

## 2020-09-28 NOTE — Patient Instructions (Signed)
[facility] Discharge Instructions for Patients Receiving Chemotherapy  Today you received the following chemotherapy agents trastuzumab-dkst   To help prevent nausea and vomiting after your treatment, we encourage you to take your nausea medication as directed.   If you develop nausea and vomiting that is not controlled by your nausea medication, call the clinic.   BELOW ARE SYMPTOMS THAT SHOULD BE REPORTED IMMEDIATELY:  *FEVER GREATER THAN 100.5 F  *CHILLS WITH OR WITHOUT FEVER  NAUSEA AND VOMITING THAT IS NOT CONTROLLED WITH YOUR NAUSEA MEDICATION  *UNUSUAL SHORTNESS OF BREATH  *UNUSUAL BRUISING OR BLEEDING  TENDERNESS IN MOUTH AND THROAT WITH OR WITHOUT PRESENCE OF ULCERS  *URINARY PROBLEMS  *BOWEL PROBLEMS  UNUSUAL RASH Items with * indicate a potential emergency and should be followed up as soon as possible.  Feel free to call the clinic should you have any questions or concerns. The clinic phone number is [phone].  Please show the [facility] at check-in to the Emergency Department and triage nurse.

## 2020-09-30 ENCOUNTER — Other Ambulatory Visit: Payer: Self-pay | Admitting: Hematology and Oncology

## 2020-10-05 ENCOUNTER — Ambulatory Visit (INDEPENDENT_AMBULATORY_CARE_PROVIDER_SITE_OTHER): Payer: PRIVATE HEALTH INSURANCE | Admitting: Infectious Disease

## 2020-10-05 ENCOUNTER — Encounter: Payer: Self-pay | Admitting: Infectious Disease

## 2020-10-05 ENCOUNTER — Other Ambulatory Visit: Payer: Self-pay

## 2020-10-05 VITALS — BP 104/66 | HR 70 | Temp 98.1°F | Ht 62.0 in | Wt 108.0 lb

## 2020-10-05 DIAGNOSIS — B449 Aspergillosis, unspecified: Secondary | ICD-10-CM

## 2020-10-05 DIAGNOSIS — Z171 Estrogen receptor negative status [ER-]: Secondary | ICD-10-CM

## 2020-10-05 DIAGNOSIS — C50212 Malignant neoplasm of upper-inner quadrant of left female breast: Secondary | ICD-10-CM

## 2020-10-05 DIAGNOSIS — Z8619 Personal history of other infectious and parasitic diseases: Secondary | ICD-10-CM

## 2020-10-05 DIAGNOSIS — C7931 Secondary malignant neoplasm of brain: Secondary | ICD-10-CM

## 2020-10-05 DIAGNOSIS — B479 Mycetoma, unspecified: Secondary | ICD-10-CM | POA: Diagnosis not present

## 2020-10-05 NOTE — Progress Notes (Signed)
Subjective:  Complaint: Coughing less now  Patient ID: Melissa [name], female    DOB: 1966-05-05, 55 y.o.   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  Along the way she was diagnosed with breast cancer 2013 underwent lumpectomy and chemotherapy.  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  She is following with Dr. Lindi Adie with oncology.  She is subsequent been found to have other metastases including to her sternum and liver.  She developed cavitary lung infection and underwent bronchoscopy which revealed Aspergillus fumigatus.  She was on Cresemba since then.  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  She also has some spiculated lesions in the posterior left lobe lobe of her lung.  Radiology is concerned that the apparent mycetoma is due to Aspergillus or another mold.  There was similar concerned that the spiculated areas could represent Aspergillus though we have no culture data to guide [name].  She continued on Cresemba at that point  She then had bronchoscopy with BAL.  Fungal cultures from the bronchoscopy have been negative though the patient was on Cresemba at the time.  There is 1 AFB culture that is positive but not for tuberculosis.  Since then apparently (and I had to go to [employer] on web (not available on epic) they stated that Mycobacteria could NOT grow on subculture so no ID  In the interim the patient's insurance had denied Cresemba and she has been changed to voriconazole.  Unfortunately she has been found to have  metastases of her breast cancer to the brain.  She  underwent stereotactic radiation to these lesions by Dr. Dorna Mai  Her chemotherapeutic regimen included Tukysa (tucatinib) which can elevate voriconazole levels and potentially cause voriconazole toxicity including QT prolongation.  Checked her voriconazole levels and they have been in the therapeutic range recently  EKG showed:  her EKG showed a QTC of 448 with a QTC of 465 ms last EKG that I could find in the computer that I could actually read was in 2019 in which she had a QTC of 442.   She does tell us that she is coughing less since being on voriconazole.   .  She had MRI of the brain done this February showed decrease in size of 3 lesions but multiple new lesions.  Her regimen currently is Xeloda Herceptin and Tucatinib  She looks better than last time I saw her.  She has not been coughing nearly as much but she knows now that with pollen and seasonal allergies about to kick in that that will change soon.  She occasionally will have hemoptysis but typically her sputum does not have blood in it.  Has a bit of a rash on her arms that she wonders may be related to chemotherapy or whether it could be related to the antiungal  Also noticed mouth sores recently which I expect is related to her chemotherapy.    Past Medical History:  Diagnosis Date  . Anemia   . Arthritis    knees and hips  . 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  [place]) 02/26/2020  . Asthma   . Breast cancer ([place])   . Bronchiectasis ([place])   . Bronchiolitis   . Cancer [facility])    breast cancer 2014  . Cancer, metastatic to liver ([place])    2021  . Complication of anesthesia    bp dropped + desat   . COPD (chronic obstructive pulmonary disease) ([place])   . Dyspnea    DOE  . GERD (gastroesophageal reflux disease)   . H/O coccidioidomycosis    was reason for lung lobectomy  . Headache(784.0)    due to eye strain or not eating  . History of anemia    no current problem  . History  of asthma    as a child  . History of breast cancer 2014   left  . History of chemotherapy    finished 07/17/2013  . History of hiatal hernia    AGE 55  . Hx of radiation therapy 03/25/13-05/06/13   left breast 5000 cGy/25 sessions, left breast boost 1000 cGy/5 sessions  . 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 Pneumonia    LAST FLARE UP 01/2018  . Runny nose 07/30/2013   clear drainage  . Wears dentures    upper    Past Surgical History:  Procedure Laterality Date  . APPLICATION OF CRANIAL NAVIGATION N/A 08/04/2017   Procedure: APPLICATION OF CRANIAL NAVIGATION;  Surgeon: [name], [name], MD;  Location: [place];  Service: Neurosurgery;  Laterality: N/A;  . AXILLARY LYMPH NODE DISSECTION Left 02/08/2013   Procedure: LEFT AXILLARY DISSECTION;  Surgeon: Edward Jolly, MD;  Location: [place];  Service: General;  Laterality: Left;  . BREAST CYST EXCISION Right 2006  . BREAST LUMPECTOMY Left 2014  . BREAST LUMPECTOMY WITH NEEDLE LOCALIZATION AND AXILLARY SENTINEL LYMPH NODE BX Left 12/31/2012   Procedure: NEEDLE LOCALIZATION LEFT BREAST LUMPECTOMY AND LEFT AXILLARY SENTENIAL LYMPH NODE BX;  Surgeon: Edward Jolly, MD;  Location: [place];  Service: General;  Laterality: Left;  . BRONCHIAL BRUSHINGS  03/17/2020   Procedure: BRONCHIAL BRUSHINGS;  Surgeon: Garner Nash, DO;  Location: [facility];  Service: Pulmonary;;  . BRONCHIAL WASHINGS  03/17/2020   Procedure: BRONCHIAL WASHINGS;  Surgeon: Garner Nash, DO;  Location: [facility];  Service: Pulmonary;;  . CESAREAN SECTION  1995/1996  . CRANIOTOMY Right 08/04/2017   Procedure: Right Frontal craniotomy for resection of tumor with stereotactic navigation;  Surgeon: [name], [name], MD;  Location: [place];  Service: Neurosurgery;  Laterality: Right;  Right Frontal craniotomy for resection of tumor with stereotactic navigation  . IR IMAGING GUIDED PORT INSERTION  05/31/2019  . LUNG LOBECTOMY Left 05/1996    upper lobe - due to [facility] Fever  . PORT-A-CATH REMOVAL Right 08/02/2013   Procedure: REMOVAL PORT-A-CATH;  Surgeon: Edward Jolly, MD;  Location: [place];  Service: General;  Laterality: Right;  . PORTACATH PLACEMENT  07/02/2012   Procedure: INSERTION PORT-A-CATH;  Surgeon: Edward Jolly, MD;  Location: [place];  Service: General;  Laterality: N/A;  right  . VIDEO BRONCHOSCOPY N/A 03/17/2020   Procedure: VIDEO BRONCHOSCOPY;  Surgeon: Garner Nash, DO;  Location: [place] [facility];  Service: Pulmonary;  Laterality: N/A;  . VIDEO BRONCHOSCOPY WITH ENDOBRONCHIAL ULTRASOUND N/A 05/23/2018   Procedure: VIDEO BRONCHOSCOPY WITH ENDOBRONCHIAL ULTRASOUND;  Surgeon: Garner Nash, DO;  Location: [facility];  Service: Thoracic;  Laterality: N/A;    Family History  Problem Relation Age of Onset  . Emphysema  Mother        was a smoker  . Heart disease Mother   . Melanoma Mother        dx in her 16s  . Breast cancer Mother 58  . Asthma Brother   . Breast cancer Cousin        mother's maternal cousin; dx in her 27s      Social History   Socioeconomic History  . Marital status: Married    Spouse name: Not on file  . Number of children: 2  . Years of education: Not on file  . Highest education level: Not on file  Occupational History  . Occupation: Secondary school teacher  Tobacco Use  . Smoking status: Current Every Day Smoker    Packs/day: 0.30    Years: 38.00    Pack years: 11.40    Types: Cigarettes  . Smokeless tobacco: Never Used  . Tobacco comment: 04/20/20 less than 0.5ppd as of 03/05/20  Vaping Use  . Vaping Use: Never used  Substance and Sexual Activity  . Alcohol use: No  . Drug use: No  . Sexual activity: Not Currently  Other Topics Concern  . Not on file  Social History Narrative  . Not on file   Social Determinants of Health   Financial Resource Strain: Not on file  Food Insecurity: Not on file  Transportation Needs: Not on  file  Physical Activity: Not on file  Stress: Not on file  Social Connections: Not on file    Allergies  Allergen Reactions  . Aspirin Anaphylaxis and Shortness Of Breath    THROAT CLOSES  . Protonix [Pantoprazole] Nausea Only and Other (See Comments)    Also caused a "film in the mouth" and caused chest pressure  . Iodinated Diagnostic Agents Rash    Patient allergic to contrast used in radiation oncology for CT simulation      Current Outpatient Medications:  .  albuterol (VENTOLIN HFA) 108 (90 Base) MCG/ACT inhaler, Inhale 2 puffs into the lungs every 6 (six) hours as needed., Disp: 8 g, Rfl: 5 .  budesonide-formoterol (SYMBICORT) 160-4.5 MCG/ACT inhaler, Inhale 2 puffs into the lungs 2 (two) times daily., Disp: 3 Inhaler, Rfl: 3 .  capecitabine (XELODA) 500 MG tablet, TAKE 2 TABLETS (1,000 MG TOTAL) BY MOUTH 2 (TWO) TIMES DAILY AFTER A MEAL. TAKE 2 TABS TWICE DAILY FOR 14 DAYS ON AND 7 DAYS OFF, Disp: 56 tablet, Rfl: 3 .  chlorpheniramine-HYDROcodone (TUSSIONEX) 10-8 MG/5ML SUER, Take 5 mLs by mouth at bedtime., Disp: 473 mL, Rfl: 0 .  ELDERBERRY PO, Take by mouth daily. Per patient, takes one daily, Disp: , Rfl:  .  levalbuterol (XOPENEX) 0.63 MG/3ML nebulizer solution, Take 3 mLs (0.63 mg total) by nebulization every 4 (four) hours as needed for wheezing or shortness of breath., Disp: 540 mL, Rfl: 6 .  lidocaine-prilocaine (EMLA) cream, APPLY EXTERNALLY TO THE AFFECTED AREA 1 TIME, Disp: 30 g, Rfl: 3 .  naproxen sodium (ALEVE) 220 MG tablet, Take 440 mg by mouth 2 (two) times daily as needed (headache). , Disp: , Rfl:  .  omeprazole (PRILOSEC) 40 MG capsule, TAKE 1 CAPSULE(40 MG) BY MOUTH DAILY, Disp: 30 capsule, Rfl: 3 .  TUKYSA 150 MG tablet, TAKE 2 TABLETS (300 MG TOTAL) BY MOUTH IN THE MORNING AND AT BEDTIME. TAKE EVERY 12 HRS AT THE SAME TIME EACH DAY WITH OR WITHOUT A MEAL., Disp: 120 tablet, Rfl: 3 .  voriconazole (VFEND) 200 MG tablet, Take 1 tablet (  200 mg total) by mouth  2 (two) times daily., Disp: 60 tablet, Rfl: 11 .  predniSONE (DELTASONE) 50 MG tablet, Take 1 tablet 13 hours, 7 hours, and 1 hour prior to scans. (Patient not taking: Reported on 10/05/2020), Disp: 6 tablet, Rfl: 0   Review of Systems  Constitutional: Negative for activity change, appetite change, chills, diaphoresis, fatigue, fever and unexpected weight change.  HENT: Positive for sore throat. Negative for congestion, rhinorrhea, sinus pressure, sneezing and trouble swallowing.   Eyes: Negative for photophobia and visual disturbance.  Respiratory: Positive for cough. Negative for chest tightness, shortness of breath, wheezing and stridor.   Cardiovascular: Negative for chest pain, palpitations and leg swelling.  Gastrointestinal: Negative for abdominal distention, abdominal pain, anal bleeding, blood in stool, constipation, diarrhea, nausea and vomiting.  Genitourinary: Negative for difficulty urinating, dysuria, flank pain and hematuria.  Musculoskeletal: Positive for myalgias. Negative for arthralgias, back pain, gait problem and joint swelling.  Skin: Positive for rash. Negative for color change, pallor and wound.  Neurological: Negative for dizziness, tremors, facial asymmetry, weakness and light-headedness.  Hematological: Negative for adenopathy. Does not bruise/bleed easily.  Psychiatric/Behavioral: Negative for agitation, behavioral problems, confusion, decreased concentration, dysphoric mood and sleep disturbance.       Objective:   Physical Exam Constitutional:      General: She is not in acute distress.    Appearance: Normal appearance. She is well-developed. She is not ill-appearing or diaphoretic.  HENT:     Head: Normocephalic and atraumatic.     Right Ear: Hearing and external ear normal.     Left Ear: Hearing and external ear normal.     Nose: No nasal deformity or rhinorrhea.  Eyes:     General: No scleral icterus.    Conjunctiva/sclera: Conjunctivae normal.      Right eye: Right conjunctiva is not injected.     Left eye: Left conjunctiva is not injected.  Neck:     Vascular: No JVD.  Cardiovascular:     Rate and Rhythm: Normal rate and regular rhythm.     Heart sounds: S1 normal and S2 normal.  Pulmonary:     Effort: Prolonged expiration present. No respiratory distress.     Breath sounds: No stridor. Wheezing and rhonchi present.  Abdominal:     General: There is no distension.     Palpations: Abdomen is soft.  Musculoskeletal:        General: Normal range of motion.     Right shoulder: Normal.     Left shoulder: Normal.     Cervical back: Normal range of motion and neck supple.     Right hip: Normal.     Left hip: Normal.     Right knee: Normal.     Left knee: Normal.  Lymphadenopathy:     Head:     Right side of head: No submandibular, preauricular or posterior auricular adenopathy.     Left side of head: No submandibular, preauricular or posterior auricular adenopathy.     Cervical: No cervical adenopathy.     Right cervical: No superficial or deep cervical adenopathy.    Left cervical: No superficial or deep cervical adenopathy.  Skin:    General: Skin is warm and dry.     Coloration: Skin is not pale.     Findings: No abrasion, bruising, ecchymosis, erythema, lesion or rash.     Nails: There is no clubbing.  Neurological:     Mental Status: She is alert and oriented to  person, place, and time.     Sensory: No sensory deficit.     Coordination: Coordination normal.     Gait: Gait normal.  Psychiatric:        Attention and Perception: Attention and perception normal. She is attentive.        Mood and Affect: Mood and affect normal.        Speech: Speech normal.        Behavior: Behavior normal. Behavior is cooperative.        Thought Content: Thought content normal.        Cognition and Memory: Cognition and memory normal.        Judgment: Judgment normal.    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  The mycobacterial species never grew on culture.  If significance is not clear at all.  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    History of coccidiomycosis: She is still very worried about this but I have more anxiety about her Aspergillus  Metastatic breast cancer: following with Dr Lindi Adie now has had repeat gamma knife surgery and chemotherapy regimen adjusted  I spent greater than 35 minutes with the patient including greater than 50% of time in face to face counsel of the patient regarding her antifungal her response to it potential side effects drug interactions and coordination of her care

## 2020-10-06 ENCOUNTER — Ambulatory Visit (HOSPITAL_COMMUNITY)
Admission: RE | Admit: 2020-10-06 | Discharge: 2020-10-06 | Disposition: A | Discharge status: Home / Self Care | Payer: PRIVATE HEALTH INSURANCE | Source: Ambulatory Visit | Attending: Hematology and Oncology | Admitting: Hematology and Oncology

## 2020-10-06 DIAGNOSIS — R0602 Shortness of breath: Secondary | ICD-10-CM | POA: Diagnosis not present

## 2020-10-06 DIAGNOSIS — Z5181 Encounter for therapeutic drug level monitoring: Secondary | ICD-10-CM | POA: Diagnosis not present

## 2020-10-06 DIAGNOSIS — Z0189 Encounter for other specified special examinations: Secondary | ICD-10-CM | POA: Diagnosis not present

## 2020-10-06 DIAGNOSIS — C50919 Malignant neoplasm of unspecified site of unspecified female breast: Secondary | ICD-10-CM | POA: Diagnosis not present

## 2020-10-06 DIAGNOSIS — C719 Malignant neoplasm of brain, unspecified: Secondary | ICD-10-CM | POA: Diagnosis not present

## 2020-10-06 DIAGNOSIS — F17200 Nicotine dependence, unspecified, uncomplicated: Secondary | ICD-10-CM | POA: Insufficient documentation

## 2020-10-06 DIAGNOSIS — J449 Chronic obstructive pulmonary disease, unspecified: Secondary | ICD-10-CM | POA: Diagnosis not present

## 2020-10-06 DIAGNOSIS — I340 Nonrheumatic mitral (valve) insufficiency: Secondary | ICD-10-CM | POA: Insufficient documentation

## 2020-10-06 DIAGNOSIS — Z79899 Other long term (current) drug therapy: Secondary | ICD-10-CM

## 2020-10-06 DIAGNOSIS — Z01818 Encounter for other preprocedural examination: Secondary | ICD-10-CM | POA: Diagnosis not present

## 2020-10-06 LAB — ECHOCARDIOGRAM COMPLETE
Area-P 1/2: 7.16 cm2
Calc EF: 60.1 %
S' Lateral: 2.5 cm
Single Plane A2C EF: 62.7 %
Single Plane A4C EF: 59.9 %

## 2020-10-06 NOTE — Progress Notes (Signed)
  Echocardiogram 2D Echocardiogram has been performed.  Melissa [name] 10/06/2020, 10:57 AM

## 2020-10-08 ENCOUNTER — Other Ambulatory Visit (HOSPITAL_COMMUNITY): Payer: Self-pay

## 2020-10-09 ENCOUNTER — Other Ambulatory Visit: Payer: Self-pay | Admitting: Radiation Therapy

## 2020-10-09 DIAGNOSIS — C7949 Secondary malignant neoplasm of other parts of nervous system: Secondary | ICD-10-CM

## 2020-10-09 DIAGNOSIS — C7931 Secondary malignant neoplasm of brain: Secondary | ICD-10-CM

## 2020-10-09 MED FILL — CAPECITABINE 500 MG TABLET: 500 | 28 days supply | Qty: 56 | Fill #0

## 2020-10-09 MED FILL — TUKYSA 150 MG TABS: 150 | 30 days supply | Qty: 120 | Fill #0

## 2020-10-15 ENCOUNTER — Other Ambulatory Visit: Payer: Self-pay | Admitting: Hematology and Oncology

## 2020-10-15 DIAGNOSIS — Z171 Estrogen receptor negative status [ER-]: Secondary | ICD-10-CM

## 2020-10-15 DIAGNOSIS — C7931 Secondary malignant neoplasm of brain: Secondary | ICD-10-CM

## 2020-10-15 DIAGNOSIS — C50912 Malignant neoplasm of unspecified site of left female breast: Secondary | ICD-10-CM

## 2020-10-15 DIAGNOSIS — C50212 Malignant neoplasm of upper-inner quadrant of left female breast: Secondary | ICD-10-CM

## 2020-10-16 ENCOUNTER — Other Ambulatory Visit: Payer: Self-pay | Admitting: Hematology and Oncology

## 2020-10-16 DIAGNOSIS — C7931 Secondary malignant neoplasm of brain: Secondary | ICD-10-CM

## 2020-10-16 DIAGNOSIS — Z171 Estrogen receptor negative status [ER-]: Secondary | ICD-10-CM

## 2020-10-16 DIAGNOSIS — C50212 Malignant neoplasm of upper-inner quadrant of left female breast: Secondary | ICD-10-CM

## 2020-10-16 DIAGNOSIS — C50912 Malignant neoplasm of unspecified site of left female breast: Secondary | ICD-10-CM

## 2020-10-16 MED ORDER — HYDROCOD POLST-CPM POLST ER 10-8 MG/5ML PO SUER: 5.0000 mL | Freq: Every day | ORAL | 0 refills | Status: DC

## 2020-10-19 ENCOUNTER — Other Ambulatory Visit: Payer: Self-pay

## 2020-10-19 ENCOUNTER — Encounter: Payer: Self-pay | Admitting: Primary Care

## 2020-10-19 ENCOUNTER — Ambulatory Visit (INDEPENDENT_AMBULATORY_CARE_PROVIDER_SITE_OTHER): Payer: PRIVATE HEALTH INSURANCE | Admitting: Primary Care

## 2020-10-19 DIAGNOSIS — J438 Other emphysema: Secondary | ICD-10-CM

## 2020-10-19 DIAGNOSIS — J984 Other disorders of lung: Secondary | ICD-10-CM | POA: Diagnosis not present

## 2020-10-19 DIAGNOSIS — J479 Bronchiectasis, uncomplicated: Secondary | ICD-10-CM | POA: Diagnosis not present

## 2020-10-19 NOTE — Patient Instructions (Signed)
Nice meeting you today Melissa [name]  Recommendations: Continue Symbicort two puffs morning and evening (rinse mouth after use) Use albuterol 2 puffs every 4-6 hours for shortness of breath/wheezing Take mucinex 600mg  twice daily for chest congestion Start over the counter Claritin or Zyrtec daily for allergy symptoms If follow-up imaging shows any increase in nodularity left lung contact our office - may need to discuss repeating bronchoscopy with Dr. Valeta Harms   Follow-up: 6 months with Dr. Valeta Harms or sooner if needed

## 2020-10-19 NOTE — Progress Notes (Signed)
 Patient Care Team: Patient, [name] as PCP - General (General Practice) Mortenson, Ashley, MD as Referring Physician (Emergency Medicine)  DIAGNOSIS:    ICD-10-CM   1. 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 Malignant neoplasm of upper-inner quadrant of left breast in female, estrogen receptor negative (HCC)  C50.212    Z17.1     SUMMARY OF ONCOLOGIC HISTORY: Oncology History  Breast cancer of upper-inner quadrant of left female breast (HCC)  06/08/2012 Initial Diagnosis   invasive ductal carcinoma that was ER positive PR positive HER-2/neu positive measuring 3.1 cm by MRI criteria. Ki-67 was 70% HER-2 was amplified with a ratio 2.91   07/12/2012 - 07/17/2013 Neo-Adjuvant Chemotherapy   TCH 6 followed by Herceptin maintenance   12/11/2012 Surgery   Left breast lumpectomy: 1.8 cm tumor 1 positive sentinel node, axillary lymph node dissection 02/08/2013 showed 0/13 lymph nodes   03/25/2013 - 05/06/2013 Radiation Therapy   Adjuvant radiation therapy   06/05/2013 - 07/20/2017 Anti-estrogen oral therapy   Tamoxifen 20 mg daily   07/27/2017 Relapse/Recurrence   MRI Brain: 3.4 x 2.9 x 2.9 cm RIGHT frontal lobe mass with imaging characteristics of solitary metastasis. Extensive vasogenic edema resulting in 9 mm RIGHT to LEFT midline shift. Equivocal very early LEFT ventricle entrapment.    08/04/2017 Surgery   Rt frontal brain resection: Poorly differentiated tumor IHC suggests breast primary ER and PR Positive   08/25/2017 - 09/04/2017 Radiation Therapy   Stereotactic radiation   09/18/2017 -  Anti-estrogen oral therapy   Lapatinib with letrozole   12/18/2017 - 12/19/2017 Radiation Therapy   New right parietal lobe metastases status post SRS   08/27/2018 - 08/27/2020 Radiation Therapy   SRS to new brain metastases   05/09/2019 Relapse/Recurrence   Interval increase in size of the enhancing nodular left internal mammary soft tissue 2.8 cm.   Redemonstrated enlarged supraclavicular, lower cervical and lower posterior cervical nodes unchanged.  Interval increase in the bony erosion of the posterior and lateral left third rib, increasing soft tissue lesion eroding the left sternal body 3.1 cm was 2.5 cm.  Bronchiectatic changes   06/19/2019 - 05/05/2020 Chemotherapy   The patient had ado-trastuzumab emtansine (KADCYLA) 180 mg in sodium chloride 0.9 % 250 mL chemo infusion, 3.6 mg/kg = 180 mg, Intravenous, Once, 16 of 16 cycles Administration: 180 mg (06/19/2019), 180 mg (07/10/2019), 180 mg (09/17/2019), 180 mg (08/05/2019), 180 mg (08/27/2019), 180 mg (10/08/2019), 180 mg (10/29/2019), 180 mg (11/19/2019), 200 mg (12/10/2019), 200 mg (12/31/2019), 200 mg (01/20/2020), 200 mg (02/11/2020), 200 mg (03/03/2020), 200 mg (03/24/2020), 200 mg (04/14/2020), 200 mg (05/05/2020)  for chemotherapy treatment.    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    Port-A-Cath in place    CHIEF COMPLIANT:   Follow-up of metastatic breast cancer  INTERVAL HISTORY: Melissa [name] is a 55 y.o. with above-mentioned history of metastatic breast cancer with brain metastasiss/presection who is currently ontreatment withTucatinib with Xeloda and Herceptin. Echo on 10/06/20  showed an ejection fraction of 60-65%. She presents to the clinic todayfortreatment. Her major complaint today is severe erythema pain and tenderness of her hands.  There is nothing on her feet.  She is also noticed blistering of her lips.  ALLERGIES:  is allergic to aspirin, protonix [pantoprazole], and iodinated diagnostic agents.  MEDICATIONS:  Current Outpatient Medications  Medication Sig Dispense Refill  . albuterol (VENTOLIN HFA) 108 (90 Base) MCG/ACT inhaler Inhale 2 puffs into the lungs every 6 (six) hours as needed. 8 g 5  . budesonide-formoterol (SYMBICORT) 160-4.5 MCG/ACT inhaler Inhale 2 puffs into the lungs 2 (two) times daily. 3 Inhaler 3  . capecitabine (XELODA) 500 MG tablet TAKE 2 TABLETS (1,000 MG TOTAL) BY MOUTH TWICE DAILY AFTER A MEAL. TAKE FOR 14 DAYS ON AND 7 DAYS OFF 56 tablet 3  . chlorpheniramine-HYDROcodone (TUSSIONEX) 10-8 MG/5ML SUER Take 5 mLs by mouth at bedtime. 473 mL 0  . ELDERBERRY PO Take by mouth daily. Per patient, takes one daily    . levalbuterol (XOPENEX) 0.63 MG/3ML nebulizer solution Take 3 mLs (0.63 mg total) by nebulization every 4 (four) hours as needed for wheezing or shortness of breath. 540 mL 6  . lidocaine-prilocaine (EMLA) cream APPLY EXTERNALLY TO THE AFFECTED AREA 1 TIME 30 g 3  . naproxen sodium (ALEVE) 220 MG tablet Take 440 mg by mouth 2 (two) times daily as needed (headache).     Marland Kitchen omeprazole (PRILOSEC) 40 MG capsule TAKE 1 CAPSULE(40 MG) BY MOUTH DAILY 30 capsule 3  . predniSONE (DELTASONE) 50 MG tablet Take 1 tablet 13 hours, 7 hours, and 1 hour prior to scans. 6 tablet 0  . tucatinib (TUKYSA) 150 MG tablet TAKE 2 TABLETS (300 MG TOTAL) BY MOUTH EVERY 12 HOURS IN THE MORNING AND AT BEDTIME. TAKE AT THE SAME TIME EACH DAY WITH OR WITHOUT A MEAL. 120 tablet 3  . voriconazole (VFEND) 200 MG tablet Take 1 tablet (200 mg total) by mouth 2 (two) times daily. 60 tablet 11   No current facility-administered medications for this visit.     PHYSICAL EXAMINATION: ECOG PERFORMANCE STATUS: 1 - Symptomatic but completely ambulatory  Vitals:   10/20/20 1021  BP: (!) 106/47  Pulse: 75  Resp: 16  Temp: (!) 97.2 F (36.2 C)  SpO2: 100%   Filed Weights   10/20/20 1021  Weight: 100 lb 14.4 oz (45.8 kg)     LABORATORY DATA:  I have reviewed the data as listed CMP Latest Ref Rng & Units 09/07/2020 08/31/2020 07/28/2020  Glucose 70 - 99 mg/dL 108(H) 102(H) 91  BUN 6 - 20 mg/dL _0 Creatinine 0.44 - 1.00 mg/dL 1.14(H) 1.08(H) 0.85  Sodium 135 - 145 mmol/L 140 138 138  Potassium 3.5 - 5.1 mmol/L 4.3 4.1 4.0  Chloride 98 - 111 mmol/L 109 105 106  CO2 22 - 32 mmol/L _1 Calcium 8.9 - 10.3 mg/dL 9.2 9.4 9.5  Total Protein 6.5 - 8.1 g/dL 7.3 7.6 7.7  Total Bilirubin 0.3 - 1.2 mg/dL 0.4 0.2(L) 0.3  Alkaline Phos 38 - 126 U/L 179(H) 161(H) 133(H)  AST 15 - 41 U/L _2 ALT 0 - 44 U/L _3 Lab Results  Component Value  Date   WBC 10.3 10/20/2020   HGB 9.0 (L) 10/20/2020   HCT 26.8 (L) 10/20/2020   MCV 100.0 10/20/2020   PLT 263 10/20/2020   NEUTROABS 7.0 10/20/2020    ASSESSMENT & PLAN:  Breast cancer of upper-inner quadrant of left female breast ([place]) 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 Anti Her 2 therapy with Lapatinibstarted 09/17/2017-05/14/2019: Stopped for progression 3.I discontinuedtamoxifen and started her on letrozole 2.5 mg daily.05/14/2019 stopped for progression 4.Stereotactic radiosurgery 12/19/2017 to the new right parietal lobe  metastases. 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 Severe anemia: Asymptomatic we will watch. Previously received blood transfusion.  Today's hemoglobin is 9 2. Chronic dry cough: I renewed a prescription for cough syrup 3.  Painful erythema of her hands: I discontinued Xeloda.  If it persistently may have to reduce the dosage of Tukysa. 4.  Blisters on the lips  Whole-body scans can be done in 6 weeks Brain MRI was scheduled for 11/26/2020 Return to clinic every 3 weeks for Herceptin treatments. I will see her in 6 weeks with labs, scans and follow-up.    No orders of the defined types were placed in this encounter.  The patient has a good understanding of the overall plan. she agrees with it. she will call with any problems that may develop before the next visit here.  Total time spent: 30 mins including face to face time and time spent for planning, charting and coordination of care  Rulon Eisenmenger, MD, MPH 10/20/2020  I,  Molly Dorshimer, am acting as scribe for Dr. Nicholas Lose.  I have reviewed the above documentation for accuracy and completeness, and I agree with the above.

## 2020-10-19 NOTE — Progress Notes (Signed)
@Patient  ID: Melissa [name], female    DOB: [birth year], 55 y.o.   MRN: [id number]  Chief Complaint  Patient presents with  . Follow-up    SOB increased/ chronic wheezing and cough at night     Referring provider: No ref. provider found  HPI: 55 year old female, current everyday smoker (11.5-pack-year history).  Past medical history significant for obstructive bronchiectasis, emphysema, aortic arthrosclerosis, mediastinal adenopathy, GERD, metastatic breast cancer.  Patient of Dr. Valeta Harms, last seen in office on 04/20/2020.  History of coccidiomycosis, valley fever in [place] status post lobectomy, left upper lobe in 1997 years of antifungal therapy on fluconazole.  She has subsequently over the years developed bronchiectasis.  Unfortunately she is still an active smoker.  In November 2013 she had breast cancer status post lung neck lumpectomy with a positive node and finished with XRT and chemo in 2015.  In May 2019 had MRI concerning for right parietal brain metastasis.  The patient ultimately underwent resection of the frontal lobe metastasis.  She was seen by Dr. Graylon Good at [facility] which is recommended bronchoscopy for sampling and cultures.  Previous [facility] pulmonary encounter: OV 04/20/2020: Patient here today for follow-up after recent bronchoscopy.  Patient has extensive medical history.  Still followed by medical oncology closely.  Recently established care with Dr. Drucilla Schmidt from infectious disease.  Breast cancer history with metastasis to the brain.  Also has significant lung disease, bronchiectasis and cavitary disease within the remaining portions of her left lung.  Status post left upper lobectomy in 1997.  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  She underwent bronchoscopy with cultures.  Fungal cultures remain negative.  She did have 1 AFB that was positive, ID for TB and MAI negative.   Pending for the others.  She does have follow-up scheduled with infectious disease.  Unfortunately she is still smoking.  We talked about the importance of smoking cessation today in the office.  She does have hemoptysis, this is scant but does occur randomly throughout the week.  10/19/2020- Interim  Patient presents today for 6 month follow-up/chronic cavitary disease of the lungs. She is doing well. Breathing is ok. She gets tired more easily now. She is compliant with Symbicort. Will need rescue inhaler and mucinex. She has some allergies, mostly effects her chest. Occasional cough at night when lying flat. Very little mucus production, very rare hemoptysis. She is following with Dr. Drucilla Schmidt with ID. She is continues voriconazole. Follow up in 4 months. She continues to try and cut back her smoing, down to 1/2 pack daily from 1 ppd. She is using nicotiine gum.  She gets angry when new tumors come up, otherwise she is up beat. She is on disability. 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  Most recent CT chest was in November 2021, this showed mild increase in area of nodularity especially involving left lung base, superimposed upon chronic left-sided bronchiectasis and upper lung predominant cavitation.  Findings favored to represent acute/subacute on chronic infection, likely atypical.  No evidence of soft tissue metastasis within chest, abdomen or pelvis.  Similar sternal body sclerotic lesion.    Allergies  Allergen Reactions  . Aspirin Anaphylaxis and Shortness Of Breath    THROAT CLOSES  . Protonix [Pantoprazole] Nausea Only and Other (See Comments)    Also caused a "film in the mouth" and caused chest pressure  . Iodinated Diagnostic Agents Rash  Patient allergic to contrast used in radiation oncology for CT simulation     Immunization History  Administered Date(s) Administered  . Influenza Split 05/03/2011  . Influenza Whole 03/11/2012  . Influenza,inj,Quad PF,6+ Mos 07/29/2017,  04/08/2019, 04/20/2020  . Pneumococcal Conjugate-13 08/09/2016  . Pneumococcal-Unspecified 08/12/2011    Past Medical History:  Diagnosis Date  . Anemia   . Arthritis    knees and hips  . 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[place]) 02/26/2020  . Asthma   . Breast cancer ([place])   . Bronchiectasis ([place])   . Bronchiolitis   . Cancer [facility])    breast cancer 2014  . Cancer, metastatic to liver ([place])    2021  . Complication of anesthesia    bp dropped + desat   . COPD (chronic obstructive pulmonary disease) ([place])   . Dyspnea    DOE  . GERD (gastroesophageal reflux disease)   . H/O coccidioidomycosis    was reason for lung lobectomy  . Headache(784.0)    due to eye strain or not eating  . History of anemia    no current problem  . History of asthma    as a child  . History of breast cancer 2014   left  . History of chemotherapy    finished 07/17/2013  . History of hiatal hernia    AGE 22  . Hx of radiation therapy 03/25/13-05/06/13   left breast 5000 cGy/25 sessions, left breast boost 1000 cGy/5 sessions  . 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 Pneumonia    LAST FLARE UP 01/2018  . Runny nose 07/30/2013   clear drainage  . Wears dentures    upper    Tobacco History: Social History   Tobacco Use  Smoking Status Current Every Day Smoker  . Packs/day: 0.30  . Years: 38.00  . Pack years: 11.40  . Types: Cigarettes  Smokeless Tobacco Never Used  Tobacco Comment   10 cigarettes smoked daily 10/19/20 [facility]    Ready to quit: Not Answered Counseling given: Not Answered Comment: 10 cigarettes smoked daily 10/19/20 [facility]    Outpatient Medications Prior to Visit  Medication Sig Dispense Refill  . albuterol (VENTOLIN HFA) 108 (90 Base) MCG/ACT inhaler Inhale 2 puffs into the lungs every 6 (six) hours as needed. 8 g 5  . budesonide-formoterol (SYMBICORT) 160-4.5 MCG/ACT inhaler Inhale 2 puffs into the lungs 2 (two) times daily. 3 Inhaler 3  . capecitabine (XELODA) 500 MG tablet TAKE 2 TABLETS (1,000 MG TOTAL) BY MOUTH  TWICE DAILY AFTER A MEAL. TAKE FOR 14 DAYS ON AND 7 DAYS OFF 56 tablet 3  . chlorpheniramine-HYDROcodone (TUSSIONEX) 10-8 MG/5ML SUER Take 5 mLs by mouth at bedtime. 473 mL 0  . ELDERBERRY PO Take by mouth daily. Per patient, takes one daily    . levalbuterol (XOPENEX) 0.63 MG/3ML nebulizer solution Take 3 mLs (0.63 mg total) by nebulization every 4 (four) hours as needed for wheezing or shortness of breath. 540 mL 6  . lidocaine-prilocaine (EMLA) cream APPLY EXTERNALLY TO THE AFFECTED AREA 1 TIME 30 g 3  . naproxen sodium (ALEVE) 220 MG tablet Take 440 mg by mouth 2 (two) times daily as needed (headache).     Marland Kitchen omeprazole (PRILOSEC) 40 MG capsule TAKE 1 CAPSULE(40 MG) BY MOUTH DAILY 30 capsule 3  . predniSONE (DELTASONE) 50 MG tablet Take 1 tablet 13 hours, 7 hours, and 1 hour prior to scans. 6 tablet 0  . tucatinib (TUKYSA) 150 MG tablet TAKE 2 TABLETS (300 MG TOTAL) BY MOUTH EVERY  12 HOURS IN THE MORNING AND AT BEDTIME. TAKE AT THE SAME TIME EACH DAY WITH OR WITHOUT A MEAL. 120 tablet 3  . voriconazole (VFEND) 200 MG tablet Take 1 tablet (200 mg total) by mouth 2 (two) times daily. 60 tablet 11   No facility-administered medications prior to visit.   Review of Systems  Review of Systems  Constitutional: Negative.   HENT: Positive for congestion.   Respiratory: Positive for cough. Negative for chest tightness, shortness of breath and wheezing.    Physical Exam  BP 118/64 (BP Location: Left Arm, Cuff Size: Normal)   Pulse 89   Temp 98.1 F (36.7 C) (Temporal)   Ht 5\' 2"  (1.575 m)   Wt 102 lb 9.6 oz (46.5 kg)   LMP 07/25/2012 Comment: pregnancy waiver form signed 01-18-2018  SpO2 98%   BMI 18.77 kg/m  Physical Exam Constitutional:      Appearance: Normal appearance.  HENT:     Head: Normocephalic and atraumatic.  Cardiovascular:     Rate and Rhythm: Normal rate and regular rhythm.  Pulmonary:     Effort: Pulmonary effort is normal.     Breath sounds: Wheezing present.   Skin:    General: Skin is warm and dry.  Neurological:     General: No focal deficit present.     Mental Status: She is alert and oriented to person, place, and time. Mental status is at baseline.  Psychiatric:        Mood and Affect: Mood normal.        Behavior: Behavior normal.        Thought Content: Thought content normal.        Judgment: Judgment normal.      Lab Results:  CBC    Component Value Date/Time   WBC 10.3 10/20/2020 1011   WBC 7.2 05/05/2020 0926   RBC 2.68 (L) 10/20/2020 1011   HGB 9.0 (L) 10/20/2020 1011   HGB 13.1 08/12/2014 0955   HCT 26.8 (L) 10/20/2020 1011   HCT 40.4 08/12/2014 0955   PLT 263 10/20/2020 1011   PLT 228 08/12/2014 0955   MCV 100.0 10/20/2020 1011   MCV 98.2 08/12/2014 0955   MCH 33.6 10/20/2020 1011   MCHC 33.6 10/20/2020 1011   RDW 26.4 (H) 10/20/2020 1011   RDW 13.0 08/12/2014 0955   LYMPHSABS 1.5 10/20/2020 1011   LYMPHSABS 2.0 08/12/2014 0955   MONOABS 0.9 10/20/2020 1011   MONOABS 0.5 08/12/2014 0955   EOSABS 0.8 (H) 10/20/2020 1011   EOSABS 0.2 08/12/2014 0955   BASOSABS 0.1 10/20/2020 1011   BASOSABS 0.0 08/12/2014 0955    BMET    Component Value Date/Time   NA 139 10/20/2020 1011   NA 142 08/12/2014 0956   K 4.0 10/20/2020 1011   K 4.6 08/12/2014 0956   CL 106 10/20/2020 1011   CL 100 12/20/2012 1509   CO2 24 10/20/2020 1011   CO2 26 08/12/2014 0956   GLUCOSE 107 (H) 10/20/2020 1011   GLUCOSE 94 08/12/2014 0956   GLUCOSE 98 12/20/2012 1509   BUN 14 10/20/2020 1011   BUN 10.1 08/12/2014 0956   CREATININE 0.98 10/20/2020 1011   CREATININE 0.88 03/11/2019 1057   CREATININE 0.9 08/12/2014 0956   CALCIUM 9.3 10/20/2020 1011   CALCIUM 9.3 08/12/2014 0956   GFRNONAA >60 10/20/2020 1011   GFRNONAA 76 03/11/2019 1057   GFRAA >60 03/24/2020 1012   GFRAA >60 02/11/2020 0836   GFRAA 88 03/11/2019 1057  BNP No results found for: BNP  ProBNP No results found for: PROBNP  Imaging: ECHOCARDIOGRAM  COMPLETE  Result Date: 10/06/2020    ECHOCARDIOGRAM REPORT   Patient Name:   Melissa [name] [facility] Date of Exam: 10/06/2020 Medical Rec #:  [id number]     Height:       62.0 in Accession #:    [id number]    Weight:       108.0 lb Date of Birth:  May 15, 1966     BSA:          1.471 m Patient Age:    18 years      BP:           112/63 mmHg Patient Gender: F             HR:           72 bpm. Exam Location:  Outpatient Procedure: 2D Echo, 3D Echo, Cardiac Doppler, Color Doppler and Strain Analysis Indications:    Z51.11 Encounter for antineoplastic chemotheraphy  History:        Patient has prior history of Echocardiogram examinations, most                 recent 06/10/2020. Abnormal ECG, COPD, Signs/Symptoms:Shortness                 of Breath and Dyspnea; Risk Factors:Current Smoker. Breast                 cancer. Brain cancer.  Sonographer:    Roseanna Rainbow Referring Phys: [id number] Nicholas Lose  Sonographer Comments: Technically difficult study due to poor echo windows. Global longitudinal strain was attempted. IMPRESSIONS  1. Left ventricular ejection fraction, by estimation, is 60 to 65%. The left ventricle has normal function. The left ventricle has no regional wall motion abnormalities. Left ventricular diastolic parameters were normal. The average left ventricular global longitudinal strain is -24.9 %. The global longitudinal strain is normal.  2. Right ventricular systolic function is normal. The right ventricular size is normal.  3. The mitral valve is normal in structure. Mild mitral valve regurgitation. No evidence of mitral stenosis.  4. The aortic valve is normal in structure. Aortic valve regurgitation is not visualized. No aortic stenosis is present.  5. The inferior vena cava is normal in size with greater than 50% respiratory variability, suggesting right atrial pressure of 3 mmHg. Comparison(s): No significant change from prior study. Prior images reviewed side by side. FINDINGS  Left Ventricle: Left ventricular  ejection fraction, by estimation, is 60 to 65%. The left ventricle has normal function. The left ventricle has no regional wall motion abnormalities. The average left ventricular global longitudinal strain is -24.9 %. The global longitudinal strain is normal. The left ventricular internal cavity size was normal in size. There is no left ventricular hypertrophy. Abnormal (paradoxical) septal motion consistent with post-operative status. Left ventricular diastolic parameters were normal. Normal left ventricular filling pressure. Right Ventricle: The right ventricular size is normal. No increase in right ventricular wall thickness. Right ventricular systolic function is normal. Left Atrium: Left atrial size was normal in size. Right Atrium: Right atrial size was normal in size. Pericardium: There is no evidence of pericardial effusion. Mitral Valve: The mitral valve is normal in structure. There is mild thickening of the mitral valve leaflet(s). Mild mitral valve regurgitation, with centrally-directed jet. No evidence of mitral valve stenosis. Tricuspid Valve: The tricuspid valve is normal in structure. Tricuspid valve regurgitation is not demonstrated. No evidence of  tricuspid stenosis. Aortic Valve: The aortic valve is normal in structure. Aortic valve regurgitation is not visualized. No aortic stenosis is present. Pulmonic Valve: The pulmonic valve was normal in structure. Pulmonic valve regurgitation is not visualized. No evidence of pulmonic stenosis. Aorta: The aortic root is normal in size and structure. Venous: The inferior vena cava is normal in size with greater than 50% respiratory variability, suggesting right atrial pressure of 3 mmHg. IAS/Shunts: No atrial level shunt detected by color flow Doppler.  LEFT VENTRICLE PLAX 2D LVIDd:         4.60 cm     Diastology LVIDs:         2.50 cm     LV e' medial:    13.20 cm/s LV PW:         1.10 cm     LV E/e' medial:  7.0 LV IVS:        1.00 cm     LV e' lateral:    11.70 cm/s LVOT diam:     1.80 cm     LV E/e' lateral: 7.9 LV SV:         58 LV SV Index:   39          2D Longitudinal Strain LVOT Area:     2.54 cm    2D Strain GLS Avg:     -24.9 %  LV Volumes (MOD) LV vol d, MOD A2C: 91.8 ml LV vol d, MOD A4C: 82.2 ml LV vol s, MOD A2C: 34.2 ml LV vol s, MOD A4C: 33.0 ml LV SV MOD A2C:     57.6 ml LV SV MOD A4C:     82.2 ml LV SV MOD BP:      53.6 ml RIGHT VENTRICLE             IVC RV S prime:     11.70 cm/s  IVC diam: 1.90 cm TAPSE (M-mode): 2.6 cm LEFT ATRIUM           Index       RIGHT ATRIUM           Index LA diam:      3.10 cm 2.11 cm/m  RA Area:     11.80 cm LA Vol (A2C): 37.4 ml 25.42 ml/m RA Volume:   27.00 ml  18.35 ml/m LA Vol (A4C): 16.1 ml 10.94 ml/m  AORTIC VALVE LVOT Vmax:   115.00 cm/s LVOT Vmean:  68.000 cm/s LVOT VTI:    0.227 m  AORTA Ao Root diam: 3.20 cm Ao Asc diam:  3.10 cm MITRAL VALVE MV Area (PHT): 7.16 cm    SHUNTS MV Decel Time: 106 msec    Systemic VTI:  0.23 m MV E velocity: 93.00 cm/s  Systemic Diam: 1.80 cm MV A velocity: 59.60 cm/s MV E/A ratio:  1.56 Mihai Croitoru MD Electronically signed by Sanda Klein MD Signature Date/Time: 10/06/2020/1:30:01 PM    Final      Assessment & Plan:   Obstructive bronchiectasis (HCC) - Stable; less mucus production with rare hemoptysis. Continues to follow-up with ID, on Voriconazole. - Take mucinex 600mg  twice daily for chest congestion - Start over the counter Claritin or Zyrtec daily for allergy symptoms   Emphysema lung (HCC) - Continues to smoke but is actively trying to quit, using NRT - Continue Symbicort 160 two puffs twice daily; albuterol 2 puffs every 4-6 hours for shortness of breath/wheezing  Cavitary lesion of lung in area of previous cyts in apex  of Sup Segment of LLL - CT chest in November 2021 showed mild increase in area of nodularity especially involving left lung base, superimposed upon chronic left-sided bronchiectasis and upper lung predominant cavitation.   -  Patient has follow-up CT imaging in May/June, if nodularity increasing in size will need to follow-up with Dr. Valeta Harms- may need repeat bronchoscopy    Martyn Ehrich, NP 10/21/2020

## 2020-10-20 ENCOUNTER — Other Ambulatory Visit: Payer: Self-pay

## 2020-10-20 ENCOUNTER — Inpatient Hospital Stay (HOSPITAL_BASED_OUTPATIENT_CLINIC_OR_DEPARTMENT_OTHER): Payer: No Typology Code available for payment source | Admitting: Hematology and Oncology

## 2020-10-20 ENCOUNTER — Inpatient Hospital Stay: Payer: No Typology Code available for payment source

## 2020-10-20 ENCOUNTER — Inpatient Hospital Stay: Payer: No Typology Code available for payment source | Attending: Hematology and Oncology

## 2020-10-20 VITALS — BP 106/47 | HR 75 | Temp 97.2°F | Resp 16 | Ht 62.0 in | Wt 100.9 lb

## 2020-10-20 DIAGNOSIS — C7951 Secondary malignant neoplasm of bone: Secondary | ICD-10-CM | POA: Insufficient documentation

## 2020-10-20 DIAGNOSIS — C50912 Malignant neoplasm of unspecified site of left female breast: Secondary | ICD-10-CM

## 2020-10-20 DIAGNOSIS — Z170 Estrogen receptor positive status [ER+]: Secondary | ICD-10-CM | POA: Diagnosis not present

## 2020-10-20 DIAGNOSIS — Z171 Estrogen receptor negative status [ER-]: Secondary | ICD-10-CM | POA: Diagnosis not present

## 2020-10-20 DIAGNOSIS — C7931 Secondary malignant neoplasm of brain: Secondary | ICD-10-CM

## 2020-10-20 DIAGNOSIS — Z95828 Presence of other vascular implants and grafts: Secondary | ICD-10-CM

## 2020-10-20 DIAGNOSIS — C787 Secondary malignant neoplasm of liver and intrahepatic bile duct: Secondary | ICD-10-CM | POA: Insufficient documentation

## 2020-10-20 DIAGNOSIS — C50212 Malignant neoplasm of upper-inner quadrant of left female breast: Secondary | ICD-10-CM

## 2020-10-20 DIAGNOSIS — Z5112 Encounter for antineoplastic immunotherapy: Secondary | ICD-10-CM | POA: Diagnosis present

## 2020-10-20 LAB — CMP (CANCER CENTER ONLY)
ALT: 12 U/L (ref 0–44)
AST: 25 U/L (ref 15–41)
Albumin: 3.4 g/dL — ABNORMAL LOW (ref 3.5–5.0)
Alkaline Phosphatase: 219 U/L — ABNORMAL HIGH (ref 38–126)
Anion gap: 9 (ref 5–15)
BUN: 14 mg/dL (ref 6–20)
CO2: 24 mmol/L (ref 22–32)
Calcium: 9.3 mg/dL (ref 8.9–10.3)
Chloride: 106 mmol/L (ref 98–111)
Creatinine: 0.98 mg/dL (ref 0.44–1.00)
GFR, Estimated: >60 mL/min (ref >60)
Glucose, Bld: 107 mg/dL — ABNORMAL HIGH (ref 70–99)
Potassium: 4 mmol/L (ref 3.5–5.1)
Sodium: 139 mmol/L (ref 135–145)
Total Bilirubin: 0.3 mg/dL (ref 0.3–1.2)
Total Protein: 7.2 g/dL (ref 6.5–8.1)

## 2020-10-20 LAB — CBC WITH DIFFERENTIAL (CANCER CENTER ONLY)
Abs Immature Granulocytes: 0.02 10*3/uL (ref 0.00–0.07)
Basophils Absolute: 0.1 10*3/uL (ref 0.0–0.1)
Basophils Relative: 1 %
Eosinophils Absolute: 0.8 10*3/uL — ABNORMAL HIGH (ref 0.0–0.5)
Eosinophils Relative: 8 %
HCT: 26.8 % — ABNORMAL LOW (ref 36.0–46.0)
Hemoglobin: 9 g/dL — ABNORMAL LOW (ref 12.0–15.0)
Immature Granulocytes: 0 %
Lymphocytes Relative: 15 %
Lymphs Abs: 1.5 10*3/uL (ref 0.7–4.0)
MCH: 33.6 pg (ref 26.0–34.0)
MCHC: 33.6 g/dL (ref 30.0–36.0)
MCV: 100 fL (ref 80.0–100.0)
Monocytes Absolute: 0.9 10*3/uL (ref 0.1–1.0)
Monocytes Relative: 9 %
Neutro Abs: 7 10*3/uL (ref 1.7–7.7)
Neutrophils Relative %: 67 %
Platelet Count: 263 10*3/uL (ref 150–400)
RBC: 2.68 MIL/uL — ABNORMAL LOW (ref 3.87–5.11)
RDW: 26.4 % — ABNORMAL HIGH (ref 11.5–15.5)
WBC Count: 10.3 10*3/uL (ref 4.0–10.5)
nRBC: 0 % (ref 0.0–0.2)

## 2020-10-20 MED ORDER — ACETAMINOPHEN 325 MG PO TABS
ORAL_TABLET | ORAL | Status: AC
  Filled 2020-10-20: qty 2

## 2020-10-20 MED ORDER — TRASTUZUMAB-DKST CHEMO 150 MG IV SOLR
300.0000 mg | Freq: Once | INTRAVENOUS | Status: AC
  Administered 2020-10-20: 300 mg via INTRAVENOUS
  Filled 2020-10-20: qty 14.29

## 2020-10-20 MED ORDER — ACETAMINOPHEN 325 MG PO TABS
650.0000 mg | ORAL_TABLET | Freq: Once | ORAL | Status: AC
Start: 2020-10-20 — End: 2020-10-20
  Administered 2020-10-20: 650 mg via ORAL

## 2020-10-20 MED ORDER — SODIUM CHLORIDE 0.9% FLUSH
10.0000 mL | INTRAVENOUS | Status: DC | PRN
  Administered 2020-10-20: 10 mL
  Filled 2020-10-20: qty 10

## 2020-10-20 MED ORDER — DIPHENHYDRAMINE HCL 25 MG PO CAPS
ORAL_CAPSULE | ORAL | Status: AC
  Filled 2020-10-20: qty 1

## 2020-10-20 MED ORDER — ALTEPLASE 2 MG IJ SOLR
2.0000 mg | Freq: Once | INTRAMUSCULAR | Status: DC | PRN
  Filled 2020-10-20: qty 2

## 2020-10-20 MED ORDER — HEPARIN SOD (PORK) LOCK FLUSH 100 UNIT/ML IV SOLN
500.0000 [IU] | Freq: Once | INTRAVENOUS | Status: AC | PRN
  Administered 2020-10-20: 500 [IU]
  Filled 2020-10-20: qty 5

## 2020-10-20 MED ORDER — HEPARIN SOD (PORK) LOCK FLUSH 100 UNIT/ML IV SOLN
500.0000 [IU] | Freq: Once | INTRAVENOUS | Status: DC | PRN
  Filled 2020-10-20: qty 5

## 2020-10-20 MED ORDER — SODIUM CHLORIDE 0.9 % IV SOLN
Freq: Once | INTRAVENOUS | Status: AC
Start: 2020-10-20 — End: 2020-10-20
  Filled 2020-10-20: qty 250

## 2020-10-20 MED ORDER — DIPHENHYDRAMINE HCL 25 MG PO CAPS
25.0000 mg | ORAL_CAPSULE | Freq: Once | ORAL | Status: AC
  Administered 2020-10-20: 25 mg via ORAL

## 2020-10-20 NOTE — Patient Instructions (Signed)
[facility] Discharge Instructions for Patients Receiving Chemotherapy  Today you received the following chemotherapy agents trastuzumab.  To help prevent nausea and vomiting after your treatment, we encourage you to take your nausea medication as directed.    If you develop nausea and vomiting that is not controlled by your nausea medication, call the clinic.   BELOW ARE SYMPTOMS THAT SHOULD BE REPORTED IMMEDIATELY:  *FEVER GREATER THAN 100.5 F  *CHILLS WITH OR WITHOUT FEVER  NAUSEA AND VOMITING THAT IS NOT CONTROLLED WITH YOUR NAUSEA MEDICATION  *UNUSUAL SHORTNESS OF BREATH  *UNUSUAL BRUISING OR BLEEDING  TENDERNESS IN MOUTH AND THROAT WITH OR WITHOUT PRESENCE OF ULCERS  *URINARY PROBLEMS  *BOWEL PROBLEMS  UNUSUAL RASH Items with * indicate a potential emergency and should be followed up as soon as possible.  Feel free to call the clinic should you have any questions or concerns. The clinic phone number is [phone].  Please show the CHEMO ALERT CARD at check-in to the Emergency Department and triage nurse.   

## 2020-10-20 NOTE — Assessment & Plan Note (Signed)
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 Anti Her 2 therapy with Lapatinibstarted 09/17/2017-05/14/2019: Stopped for progression 3.I discontinuedtamoxifen and started her on letrozole 2.5 mg daily.05/14/2019 stopped for progression 4.Stereotactic radiosurgery 12/19/2017 to the new right parietal lobe metastases. 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 Overall tolerating it very well.  Severe anemia: Asymptomatic we will watch. Previously received blood transfusion.  Today's hemoglobin is 9.2 Chronic dry cough:  I renewed a prescription for cough syrup . Whole-body scans can be done in 6 weeks  Return to clinic every 3 weeks for Herceptin treatments. I will see her in 6 weeks with labs, scans and follow-up.

## 2020-10-21 ENCOUNTER — Encounter: Payer: Self-pay | Admitting: Primary Care

## 2020-10-21 NOTE — Assessment & Plan Note (Addendum)
-   CT chest in November 2021 showed mild increase in area of nodularity especially involving left lung base, superimposed upon chronic left-sided bronchiectasis and upper lung predominant cavitation.   - Patient has follow-up CT imaging in May/June, if nodularity increasing in size will need to follow-up with Dr. Valeta Harms- may need repeat bronchoscopy

## 2020-10-21 NOTE — Assessment & Plan Note (Addendum)
-   Continues to smoke but is actively trying to quit, using NRT - Continue Symbicort 160 two puffs twice daily; albuterol 2 puffs every 4-6 hours for shortness of breath/wheezing

## 2020-10-21 NOTE — Assessment & Plan Note (Addendum)
-   Stable; less mucus production with rare hemoptysis. Continues to follow-up with ID, on Voriconazole. - Take mucinex 600mg  twice daily for chest congestion - Start over the counter Claritin or Zyrtec daily for allergy symptoms

## 2020-10-21 NOTE — Progress Notes (Signed)
PCCM:  Thanks for seeing her   Garner Nash, DO [facility] 10/21/2020 10:49 AM

## 2020-11-03 ENCOUNTER — Telehealth: Payer: Self-pay | Admitting: Pulmonary Disease

## 2020-11-03 DIAGNOSIS — R059 Cough, unspecified: Secondary | ICD-10-CM

## 2020-11-03 DIAGNOSIS — J069 Acute upper respiratory infection, unspecified: Secondary | ICD-10-CM

## 2020-11-03 MED ORDER — ALBUTEROL SULFATE HFA 108 (90 BASE) MCG/ACT IN AERS: 2.0000 | INHALATION_SPRAY | Freq: Four times a day (QID) | RESPIRATORY_TRACT | 5 refills | Status: DC | PRN

## 2020-11-03 NOTE — Telephone Encounter (Signed)
Refill has been sent to the pharmacy. Nothing further is needed. 

## 2020-11-04 ENCOUNTER — Other Ambulatory Visit (HOSPITAL_COMMUNITY): Payer: Self-pay

## 2020-11-04 MED FILL — Tucatinib Tab 150 MG: ORAL | 30 days supply | Qty: 120 | Fill #0 | Status: AC

## 2020-11-05 ENCOUNTER — Other Ambulatory Visit (HOSPITAL_COMMUNITY): Payer: Self-pay

## 2020-11-09 ENCOUNTER — Inpatient Hospital Stay: Payer: No Typology Code available for payment source | Attending: Medical

## 2020-11-09 ENCOUNTER — Other Ambulatory Visit: Payer: Self-pay

## 2020-11-09 VITALS — BP 110/52 | HR 69 | Temp 98.1°F | Resp 16 | Wt 102.5 lb

## 2020-11-09 DIAGNOSIS — Z5112 Encounter for antineoplastic immunotherapy: Secondary | ICD-10-CM | POA: Diagnosis present

## 2020-11-09 DIAGNOSIS — Z170 Estrogen receptor positive status [ER+]: Secondary | ICD-10-CM | POA: Insufficient documentation

## 2020-11-09 DIAGNOSIS — C7951 Secondary malignant neoplasm of bone: Secondary | ICD-10-CM | POA: Diagnosis not present

## 2020-11-09 DIAGNOSIS — C7931 Secondary malignant neoplasm of brain: Secondary | ICD-10-CM | POA: Diagnosis not present

## 2020-11-09 DIAGNOSIS — C50212 Malignant neoplasm of upper-inner quadrant of left female breast: Secondary | ICD-10-CM | POA: Diagnosis present

## 2020-11-09 DIAGNOSIS — Z171 Estrogen receptor negative status [ER-]: Secondary | ICD-10-CM

## 2020-11-09 DIAGNOSIS — C787 Secondary malignant neoplasm of liver and intrahepatic bile duct: Secondary | ICD-10-CM | POA: Diagnosis present

## 2020-11-09 LAB — CBC WITH DIFFERENTIAL (CANCER CENTER ONLY)
Abs Immature Granulocytes: 0.02 10*3/uL (ref 0.00–0.07)
Basophils Absolute: 0 10*3/uL (ref 0.0–0.1)
Basophils Relative: 0 %
Eosinophils Absolute: 0 10*3/uL (ref 0.0–0.5)
Eosinophils Relative: 0 %
HCT: 25 % — ABNORMAL LOW (ref 36.0–46.0)
Hemoglobin: 8.4 g/dL — ABNORMAL LOW (ref 12.0–15.0)
Immature Granulocytes: 0 %
Lymphocytes Relative: 14 %
Lymphs Abs: 1.2 10*3/uL (ref 0.7–4.0)
MCH: 32.3 pg (ref 26.0–34.0)
MCHC: 33.6 g/dL (ref 30.0–36.0)
MCV: 96.2 fL (ref 80.0–100.0)
Monocytes Absolute: 0.5 10*3/uL (ref 0.1–1.0)
Monocytes Relative: 6 %
Neutro Abs: 6.8 10*3/uL (ref 1.7–7.7)
Neutrophils Relative %: 80 %
Platelet Count: 332 10*3/uL (ref 150–400)
RBC: 2.6 MIL/uL — ABNORMAL LOW (ref 3.87–5.11)
RDW: 22 % — ABNORMAL HIGH (ref 11.5–15.5)
WBC Count: 8.6 10*3/uL (ref 4.0–10.5)
nRBC: 0 % (ref 0.0–0.2)

## 2020-11-09 LAB — CMP (CANCER CENTER ONLY)
ALT: 9 U/L (ref 0–44)
AST: 20 U/L (ref 15–41)
Albumin: 3.1 g/dL — ABNORMAL LOW (ref 3.5–5.0)
Alkaline Phosphatase: 185 U/L — ABNORMAL HIGH (ref 38–126)
Anion gap: 8 (ref 5–15)
BUN: 18 mg/dL (ref 6–20)
CO2: 23 mmol/L (ref 22–32)
Calcium: 9.1 mg/dL (ref 8.9–10.3)
Chloride: 106 mmol/L (ref 98–111)
Creatinine: 0.86 mg/dL (ref 0.44–1.00)
GFR, Estimated: >60 mL/min (ref >60)
Glucose, Bld: 156 mg/dL — ABNORMAL HIGH (ref 70–99)
Potassium: 3.8 mmol/L (ref 3.5–5.1)
Sodium: 137 mmol/L (ref 135–145)
Total Bilirubin: <0.2 mg/dL — ABNORMAL LOW (ref 0.3–1.2)
Total Protein: 7.1 g/dL (ref 6.5–8.1)

## 2020-11-09 MED ORDER — DIPHENHYDRAMINE HCL 25 MG PO CAPS
ORAL_CAPSULE | ORAL | Status: AC
  Filled 2020-11-09: qty 1

## 2020-11-09 MED ORDER — SODIUM CHLORIDE 0.9% FLUSH
10.0000 mL | INTRAVENOUS | Status: DC | PRN
  Administered 2020-11-09: 10 mL
  Filled 2020-11-09: qty 10

## 2020-11-09 MED ORDER — TRASTUZUMAB-DKST CHEMO 150 MG IV SOLR
300.0000 mg | Freq: Once | INTRAVENOUS | Status: AC
  Administered 2020-11-09: 300 mg via INTRAVENOUS
  Filled 2020-11-09: qty 14.29

## 2020-11-09 MED ORDER — ACETAMINOPHEN 325 MG PO TABS
ORAL_TABLET | ORAL | Status: AC
  Filled 2020-11-09: qty 2

## 2020-11-09 MED ORDER — DIPHENHYDRAMINE HCL 25 MG PO CAPS
25.0000 mg | ORAL_CAPSULE | Freq: Once | ORAL | Status: AC
  Administered 2020-11-09: 25 mg via ORAL

## 2020-11-09 MED ORDER — ACETAMINOPHEN 325 MG PO TABS
650.0000 mg | ORAL_TABLET | Freq: Once | ORAL | Status: AC
  Administered 2020-11-09: 650 mg via ORAL

## 2020-11-09 MED ORDER — SODIUM CHLORIDE 0.9 % IV SOLN
Freq: Once | INTRAVENOUS | Status: AC
Start: 2020-11-09 — End: 2020-11-09
  Filled 2020-11-09: qty 250

## 2020-11-09 MED ORDER — HEPARIN SOD (PORK) LOCK FLUSH 100 UNIT/ML IV SOLN
500.0000 [IU] | Freq: Once | INTRAVENOUS | Status: AC | PRN
  Administered 2020-11-09: 500 [IU]
  Filled 2020-11-09: qty 5

## 2020-11-09 NOTE — Patient Instructions (Signed)
[facility] MEDICAL ONCOLOGY  Discharge Instructions: Thank you for choosing [facility] to provide your oncology and hematology care.   If you have a lab appointment with the Cancer Center, please go directly to the Cancer Center and check in at the registration area.   Wear comfortable clothing and clothing appropriate for easy access to any Portacath or PICC line.   We strive to give you quality time with your provider. You may need to reschedule your appointment if you arrive late (15 or more minutes).  Arriving late affects you and other patients whose appointments are after yours.  Also, if you miss three or more appointments without notifying the office, you may be dismissed from the clinic at the provider's discretion.      For prescription refill requests, have your pharmacy contact our office and allow 72 hours for refills to be completed.    Today you received the following chemotherapy and/or immunotherapy agents trastuzumab      To help prevent nausea and vomiting after your treatment, we encourage you to take your nausea medication as directed.  BELOW ARE SYMPTOMS THAT SHOULD BE REPORTED IMMEDIATELY: *FEVER GREATER THAN 100.4 F (38 C) OR HIGHER *CHILLS OR SWEATING *NAUSEA AND VOMITING THAT IS NOT CONTROLLED WITH YOUR NAUSEA MEDICATION *UNUSUAL SHORTNESS OF BREATH *UNUSUAL BRUISING OR BLEEDING *URINARY PROBLEMS (pain or burning when urinating, or frequent urination) *BOWEL PROBLEMS (unusual diarrhea, constipation, pain near the anus) TENDERNESS IN MOUTH AND THROAT WITH OR WITHOUT PRESENCE OF ULCERS (sore throat, sores in mouth, or a toothache) UNUSUAL RASH, SWELLING OR PAIN  UNUSUAL VAGINAL DISCHARGE OR ITCHING   Items with * indicate a potential emergency and should be followed up as soon as possible or go to the Emergency Department if any problems should occur.  Please show the CHEMOTHERAPY ALERT CARD or IMMUNOTHERAPY ALERT CARD at check-in to  the Emergency Department and triage nurse.  Should you have questions after your visit or need to cancel or reschedule your appointment, please contact [facility]ICAL ONCOLOG[facility]  Dept: [phone]  and follow the prompts.  Office hours are 8:00 a.m. to 4:30 p.m. Monday - Friday. Please note that voicemails left after 4:00 p.m. may not be returned until the following business day.  We are closed weekends and major holidays. You have access to a nurse at all times for urgent questions. Please call the main number to the clinic Dept: [phone] and follow the prompts.   For any non-urgent questions, you may also contact your provider using MyChart. We now offer e-Visits for anyone 18 and older to request care online for non-urgent symptoms. For details visit [email or web address].   Also download the MyChart app! Go to the app store, search "MyChart", open the app, select [facility], and log in with your MyChart username and password.  Due to Covid, a mask is required upon entering the hospital/clinic. If you do not have a mask, one will be given to you upon arrival. For doctor visits, patients may have 1 support person aged 18 or older with them. For treatment visits, patients cannot have anyone with them due to current Covid guidelines and our immunocompromised population.   

## 2020-11-11 ENCOUNTER — Encounter: Payer: Self-pay | Admitting: Hematology and Oncology

## 2020-11-16 ENCOUNTER — Other Ambulatory Visit: Payer: Self-pay | Admitting: Hematology and Oncology

## 2020-11-16 ENCOUNTER — Other Ambulatory Visit: Payer: Self-pay

## 2020-11-16 DIAGNOSIS — Z171 Estrogen receptor negative status [ER-]: Secondary | ICD-10-CM

## 2020-11-16 DIAGNOSIS — K219 Gastro-esophageal reflux disease without esophagitis: Secondary | ICD-10-CM

## 2020-11-16 DIAGNOSIS — C50912 Malignant neoplasm of unspecified site of left female breast: Secondary | ICD-10-CM

## 2020-11-16 DIAGNOSIS — C50212 Malignant neoplasm of upper-inner quadrant of left female breast: Secondary | ICD-10-CM

## 2020-11-16 MED ORDER — OMEPRAZOLE 40 MG PO CPDR: DELAYED_RELEASE_CAPSULE | ORAL | 9 refills | Status: DC

## 2020-11-16 MED ORDER — HYDROCOD POLST-CPM POLST ER 10-8 MG/5ML PO SUER: 5.0000 mL | Freq: Every day | ORAL | 0 refills | Status: DC

## 2020-11-20 ENCOUNTER — Other Ambulatory Visit: Payer: Self-pay | Admitting: *Deleted

## 2020-11-20 ENCOUNTER — Telehealth: Payer: Self-pay | Admitting: Pulmonary Disease

## 2020-11-20 MED ORDER — BUDESONIDE-FORMOTEROL FUMARATE 160-4.5 MCG/ACT IN AERO: 2.0000 | INHALATION_SPRAY | Freq: Two times a day (BID) | RESPIRATORY_TRACT | 3 refills | Status: DC

## 2020-11-20 NOTE — Telephone Encounter (Signed)
Called and spoke with Patient.  Patient stated she was approved for [employer] patient assistance for Symbicort.  Patient stated [employer] are requesting a paper prescription be sent to [employer].  Symbicort prescription printed,signed,and faxed to [employer] [phone].

## 2020-11-23 MED ORDER — BUDESONIDE-FORMOTEROL FUMARATE 160-4.5 MCG/ACT IN AERO: 2.0000 | INHALATION_SPRAY | Freq: Two times a day (BID) | RESPIRATORY_TRACT | 3 refills | Status: DC

## 2020-11-23 NOTE — Addendum Note (Signed)
Addended by: Abigail Miyamoto on: 11/23/2020 05:22 PM   Modules accepted: Orders

## 2020-11-26 ENCOUNTER — Other Ambulatory Visit: Payer: Self-pay

## 2020-11-26 ENCOUNTER — Ambulatory Visit (HOSPITAL_COMMUNITY)
Admission: RE | Admit: 2020-11-26 | Discharge: 2020-11-26 | Disposition: A | Discharge status: Home / Self Care | Payer: No Typology Code available for payment source | Source: Ambulatory Visit | Attending: Radiation Oncology | Admitting: Radiation Oncology

## 2020-11-26 ENCOUNTER — Other Ambulatory Visit (HOSPITAL_COMMUNITY): Payer: Self-pay

## 2020-11-26 DIAGNOSIS — C7931 Secondary malignant neoplasm of brain: Secondary | ICD-10-CM | POA: Insufficient documentation

## 2020-11-26 DIAGNOSIS — C7949 Secondary malignant neoplasm of other parts of nervous system: Secondary | ICD-10-CM | POA: Insufficient documentation

## 2020-11-26 MED ORDER — HEPARIN SOD (PORK) LOCK FLUSH 100 UNIT/ML IV SOLN
500.0000 [IU] | INTRAVENOUS | Status: AC | PRN
  Administered 2020-11-26: 500 [IU]
  Filled 2020-11-26: qty 5

## 2020-11-26 MED ORDER — GADOBUTROL 1 MMOL/ML IV SOLN
4.5000 mL | Freq: Once | INTRAVENOUS | Status: AC | PRN
  Administered 2020-11-26: 4.5 mL via INTRAVENOUS

## 2020-11-26 NOTE — Progress Notes (Signed)
Patient is aware that this is a phone visit. Patients meaningful use is complete. Patient states that she has some pain in her chest . Patient denies any fatigue. Patient denies any headaches. Patient denies any nausea or vomiting. Patient denies any issues with her gait or hand coordination.

## 2020-11-26 NOTE — Progress Notes (Signed)
Accessed Implanted port Rt chest - no blood return. Pt says that sometimes it won't have a blood return informed her we have to have blood return to use for MRI. Pt choose not to wait 1 hour to have Cath flow instilled to see if it will return blood. Pt asked that peripheral IV be started for MRI. Peripheral IV started and implanted port deaccessed after 10 ml flush of normal saline followed by 500 units of Heparin. Pt says she gets seen at [facility] next week for treatment and will call and tell them she had no blood return from access on May 19.

## 2020-11-27 ENCOUNTER — Encounter (HOSPITAL_COMMUNITY): Payer: Self-pay

## 2020-11-27 ENCOUNTER — Ambulatory Visit (HOSPITAL_COMMUNITY)
Admission: RE | Admit: 2020-11-27 | Discharge: 2020-11-27 | Disposition: A | Discharge status: Home / Self Care | Payer: No Typology Code available for payment source | Source: Ambulatory Visit | Attending: Hematology and Oncology | Admitting: Hematology and Oncology

## 2020-11-27 ENCOUNTER — Ambulatory Visit (HOSPITAL_COMMUNITY): Payer: No Typology Code available for payment source

## 2020-11-27 DIAGNOSIS — C7931 Secondary malignant neoplasm of brain: Secondary | ICD-10-CM

## 2020-11-27 DIAGNOSIS — C50912 Malignant neoplasm of unspecified site of left female breast: Secondary | ICD-10-CM

## 2020-11-27 DIAGNOSIS — Z171 Estrogen receptor negative status [ER-]: Secondary | ICD-10-CM

## 2020-11-27 DIAGNOSIS — C50212 Malignant neoplasm of upper-inner quadrant of left female breast: Secondary | ICD-10-CM

## 2020-11-29 NOTE — Assessment & Plan Note (Signed)
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 Anti Her 2 therapy with Lapatinibstarted 09/17/2017-05/14/2019: Stopped for progression 3.I discontinuedtamoxifen and started her on letrozole 2.5 mg daily.05/14/2019 stopped for progression 4.Stereotactic radiosurgery 12/19/2017 to the new right parietal lobe metastases. 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 Severe anemia: Asymptomatic we will watch. Previously received blood transfusion. Today's hemoglobin is 9 2. Chronic dry cough: I renewed a  prescription for cough syrup 3.  Painful erythema of her hands: I discontinued Xeloda.  If it persistently may have to reduce the dosage of Tukysa. 4.  Blisters on the lips  Whole-body scans can be done in 6 weeks Brain MRI 11/27/2020: Unchanged brain mets Return to clinic every 3 weeks for Herceptin treatments. I will see her in 6 weeks with labs, scans and follow-up.

## 2020-11-30 ENCOUNTER — Inpatient Hospital Stay (HOSPITAL_BASED_OUTPATIENT_CLINIC_OR_DEPARTMENT_OTHER): Payer: No Typology Code available for payment source | Admitting: Hematology and Oncology

## 2020-11-30 ENCOUNTER — Inpatient Hospital Stay: Payer: No Typology Code available for payment source

## 2020-11-30 ENCOUNTER — Other Ambulatory Visit: Payer: PRIVATE HEALTH INSURANCE

## 2020-11-30 ENCOUNTER — Other Ambulatory Visit: Payer: Self-pay

## 2020-11-30 VITALS — BP 104/62 | HR 75 | Temp 97.9°F | Resp 20 | Ht 62.0 in | Wt 102.5 lb

## 2020-11-30 DIAGNOSIS — C7931 Secondary malignant neoplasm of brain: Secondary | ICD-10-CM

## 2020-11-30 DIAGNOSIS — Z171 Estrogen receptor negative status [ER-]: Secondary | ICD-10-CM | POA: Diagnosis not present

## 2020-11-30 DIAGNOSIS — C50212 Malignant neoplasm of upper-inner quadrant of left female breast: Secondary | ICD-10-CM | POA: Diagnosis not present

## 2020-11-30 DIAGNOSIS — C50912 Malignant neoplasm of unspecified site of left female breast: Secondary | ICD-10-CM | POA: Diagnosis not present

## 2020-11-30 DIAGNOSIS — Z95828 Presence of other vascular implants and grafts: Secondary | ICD-10-CM

## 2020-11-30 DIAGNOSIS — Z5112 Encounter for antineoplastic immunotherapy: Secondary | ICD-10-CM | POA: Diagnosis not present

## 2020-11-30 LAB — CBC WITH DIFFERENTIAL (CANCER CENTER ONLY)
Abs Immature Granulocytes: 0.03 10*3/uL (ref 0.00–0.07)
Basophils Absolute: 0.1 10*3/uL (ref 0.0–0.1)
Basophils Relative: 1 %
Eosinophils Absolute: 1 10*3/uL — ABNORMAL HIGH (ref 0.0–0.5)
Eosinophils Relative: 8 %
HCT: 26.6 % — ABNORMAL LOW (ref 36.0–46.0)
Hemoglobin: 8.4 g/dL — ABNORMAL LOW (ref 12.0–15.0)
Immature Granulocytes: 0 %
Lymphocytes Relative: 15 %
Lymphs Abs: 1.9 10*3/uL (ref 0.7–4.0)
MCH: 30.1 pg (ref 26.0–34.0)
MCHC: 31.6 g/dL (ref 30.0–36.0)
MCV: 95.3 fL (ref 80.0–100.0)
Monocytes Absolute: 1 10*3/uL (ref 0.1–1.0)
Monocytes Relative: 8 %
Neutro Abs: 8.7 10*3/uL — ABNORMAL HIGH (ref 1.7–7.7)
Neutrophils Relative %: 68 %
Platelet Count: 368 10*3/uL (ref 150–400)
RBC: 2.79 MIL/uL — ABNORMAL LOW (ref 3.87–5.11)
RDW: 20.5 % — ABNORMAL HIGH (ref 11.5–15.5)
WBC Count: 12.7 10*3/uL — ABNORMAL HIGH (ref 4.0–10.5)
nRBC: 0 % (ref 0.0–0.2)

## 2020-11-30 LAB — CMP (CANCER CENTER ONLY)
ALT: 8 U/L (ref 0–44)
AST: 14 U/L — ABNORMAL LOW (ref 15–41)
Albumin: 2.8 g/dL — ABNORMAL LOW (ref 3.5–5.0)
Alkaline Phosphatase: 154 U/L — ABNORMAL HIGH (ref 38–126)
Anion gap: 7 (ref 5–15)
BUN: 14 mg/dL (ref 6–20)
CO2: 25 mmol/L (ref 22–32)
Calcium: 8.9 mg/dL (ref 8.9–10.3)
Chloride: 108 mmol/L (ref 98–111)
Creatinine: 0.76 mg/dL (ref 0.44–1.00)
GFR, Estimated: >60 mL/min (ref >60)
Glucose, Bld: 84 mg/dL (ref 70–99)
Potassium: 4.3 mmol/L (ref 3.5–5.1)
Sodium: 140 mmol/L (ref 135–145)
Total Bilirubin: <0.2 mg/dL — ABNORMAL LOW (ref 0.3–1.2)
Total Protein: 6.9 g/dL (ref 6.5–8.1)

## 2020-11-30 MED ORDER — SODIUM CHLORIDE 0.9% FLUSH
10.0000 mL | INTRAVENOUS | Status: DC | PRN
  Administered 2020-11-30: 10 mL
  Filled 2020-11-30: qty 10

## 2020-11-30 MED ORDER — DIPHENHYDRAMINE HCL 25 MG PO CAPS
25.0000 mg | ORAL_CAPSULE | Freq: Once | ORAL | Status: AC
  Administered 2020-11-30: 25 mg via ORAL

## 2020-11-30 MED ORDER — TRASTUZUMAB-DKST CHEMO 150 MG IV SOLR
300.0000 mg | Freq: Once | INTRAVENOUS | Status: AC
  Administered 2020-11-30: 300 mg via INTRAVENOUS
  Filled 2020-11-30: qty 14.29

## 2020-11-30 MED ORDER — ACETAMINOPHEN 325 MG PO TABS
650.0000 mg | ORAL_TABLET | Freq: Once | ORAL | Status: AC
  Administered 2020-11-30: 650 mg via ORAL

## 2020-11-30 MED ORDER — SODIUM CHLORIDE 0.9 % IV SOLN
Freq: Once | INTRAVENOUS | Status: AC
  Filled 2020-11-30: qty 250

## 2020-11-30 MED ORDER — TUCATINIB 150 MG PO TABS: 150.0000 mg | ORAL_TABLET | Freq: Two times a day (BID) | ORAL | 3 refills | Status: DC

## 2020-11-30 NOTE — Progress Notes (Signed)
 Patient Care Team: Patient, [name] as PCP - General (General Practice) Mortenson, Ashley, MD as Referring Physician (Emergency Medicine)  DIAGNOSIS:    ICD-10-CM   1. 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 Malignant neoplasm of upper-inner quadrant of left breast in female, estrogen receptor negative (HCC)  C50.212    Z17.1     SUMMARY OF ONCOLOGIC HISTORY: Oncology History  Breast cancer of upper-inner quadrant of left female breast (HCC)  06/08/2012 Initial Diagnosis   invasive ductal carcinoma that was ER positive PR positive HER-2/neu positive measuring 3.1 cm by MRI criteria. Ki-67 was 70% HER-2 was amplified with a ratio 2.91   07/12/2012 - 07/17/2013 Neo-Adjuvant Chemotherapy   TCH 6 followed by Herceptin maintenance   12/11/2012 Surgery   Left breast lumpectomy: 1.8 cm tumor 1 positive sentinel node, axillary lymph node dissection 02/08/2013 showed 0/13 lymph nodes   03/25/2013 - 05/06/2013 Radiation Therapy   Adjuvant radiation therapy   06/05/2013 - 07/20/2017 Anti-estrogen oral therapy   Tamoxifen 20 mg daily   07/27/2017 Relapse/Recurrence   MRI Brain: 3.4 x 2.9 x 2.9 cm RIGHT frontal lobe mass with imaging characteristics of solitary metastasis. Extensive vasogenic edema resulting in 9 mm RIGHT to LEFT midline shift. Equivocal very early LEFT ventricle entrapment.    08/04/2017 Surgery   Rt frontal brain resection: Poorly differentiated tumor IHC suggests breast primary ER and PR Positive   08/25/2017 - 09/04/2017 Radiation Therapy   Stereotactic radiation   09/18/2017 -  Anti-estrogen oral therapy   Lapatinib with letrozole   12/18/2017 - 12/19/2017 Radiation Therapy   New right parietal lobe metastases status post SRS   08/27/2018 - 08/27/2020 Radiation Therapy   SRS to new brain metastases   05/09/2019 Relapse/Recurrence   Interval increase in size of the enhancing nodular left internal mammary soft tissue 2.8 cm.   Redemonstrated enlarged supraclavicular, lower cervical and lower posterior cervical nodes unchanged.  Interval increase in the bony erosion of the posterior and lateral left third rib, increasing soft tissue lesion eroding the left sternal body 3.1 cm was 2.5 cm.  Bronchiectatic changes   06/19/2019 - 05/05/2020 Chemotherapy   The patient had ado-trastuzumab emtansine (KADCYLA) 180 mg in sodium chloride 0.9 % 250 mL chemo infusion, 3.6 mg/kg = 180 mg, Intravenous, Once, 16 of 16 cycles Administration: 180 mg (06/19/2019), 180 mg (07/10/2019), 180 mg (09/17/2019), 180 mg (08/05/2019), 180 mg (08/27/2019), 180 mg (10/08/2019), 180 mg (10/29/2019), 180 mg (11/19/2019), 200 mg (12/10/2019), 200 mg (12/31/2019), 200 mg (01/20/2020), 200 mg (02/11/2020), 200 mg (03/03/2020), 200 mg (03/24/2020), 200 mg (04/14/2020), 200 mg (05/05/2020)  for chemotherapy treatment.    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    Port-A-Cath in place    CHIEF COMPLIANT:   Follow-up of metastatic breast cancer  INTERVAL HISTORY: Melissa [name] is a 55 y.o. with above-mentioned history of metastatic breast cancer with brain metastasiss/presection who is currently ontreatment withTucatinib with Xeloda and Herceptin.She presents to the  clinic todayfortreatment. She had developed rash on her dorsum of the hands for which we stopped the Tucatinib.  It appears that the rash is slightly better.  She is applying moisturizing lotion to it.  We had previously stopped Xeloda for the same reason as well.  ALLERGIES:  is allergic to aspirin, protonix [pantoprazole], and iodinated diagnostic agents.  MEDICATIONS:  Current Outpatient Medications  Medication Sig Dispense Refill  . albuterol (VENTOLIN HFA) 108 (90 Base) MCG/ACT inhaler Inhale 2 puffs into the lungs every 6 (six) hours as needed. 8 g 5  . budesonide-formoterol (SYMBICORT) 160-4.5 MCG/ACT inhaler Inhale 2 puffs into the lungs 2 (two) times daily. 3 each 3  . capecitabine (XELODA) 500 MG tablet TAKE 2 TABLETS (1,000 MG TOTAL) BY MOUTH TWICE DAILY AFTER A MEAL. TAKE FOR 14 DAYS ON AND 7 DAYS OFF (Patient not taking: Reported on 11/26/2020) 56 tablet 3  . chlorpheniramine-HYDROcodone (TUSSIONEX) 10-8 MG/5ML SUER Take 5 mLs by mouth at bedtime. 115 mL 0  . ELDERBERRY PO Take by mouth daily. Per patient, takes one daily    . levalbuterol (XOPENEX) 0.63 MG/3ML nebulizer solution Take 3 mLs (0.63 mg total) by nebulization every 4 (four) hours as needed for wheezing or shortness of breath. 540 mL 6  . lidocaine-prilocaine (EMLA) cream APPLY EXTERNALLY TO THE AFFECTED AREA 1 TIME 30 g 3  . naproxen sodium (ALEVE) 220 MG tablet Take 440 mg by mouth 2 (two) times daily as needed (headache).     Marland Kitchen omeprazole (PRILOSEC) 40 MG capsule TAKE 1 CAPSULE(40 MG) BY MOUTH DAILY 30 capsule 9  . predniSONE (DELTASONE) 50 MG tablet Take 1 tablet 13 hours, 7 hours, and 1 hour prior to scans. 6 tablet 0  . tucatinib (TUKYSA) 150 MG tablet Take 1 tablet (150 mg total) by mouth in the morning and at bedtime. 120 tablet 3  . voriconazole (VFEND) 200 MG tablet Take 1 tablet (200 mg total) by mouth 2 (two) times daily. 60 tablet 11   No current facility-administered medications for this visit.     PHYSICAL EXAMINATION: ECOG PERFORMANCE STATUS: 1 - Symptomatic but completely ambulatory  Vitals:   11/30/20 1110  BP: 104/62  Pulse: 75  Resp: 20  Temp: 97.9 F (36.6 C)  SpO2: 100%   Filed Weights   11/30/20 1110  Weight: 102 lb 8 oz (46.5 kg)        LABORATORY DATA:  I have reviewed the data as listed CMP Latest Ref Rng & Units 11/09/2020 10/20/2020 09/07/2020  Glucose 70 - 99 mg/dL 156(H) 107(H) 108(H)  BUN 6 - 20 mg/dL _0 Creatinine 0.44 - 1.00 mg/dL 0.86 0.98 1.14(H)  Sodium 135 - 145 mmol/L 137 139 140  Potassium 3.5 - 5.1 mmol/L 3.8 4.0 4.3  Chloride 98 - 111 mmol/L 106 106 109  CO2 22 - 32 mmol/L _1 Calcium 8.9 - 10.3 mg/dL 9.1 9.3 9.2  Total Protein 6.5 - 8.1 g/dL 7.1 7.2 7.3  Total Bilirubin 0.3 - 1.2 mg/dL <0.2(L) 0.3 0.4  Alkaline Phos 38 - 126 U/L 185(H) 219(H) 179(H)  AST 15 - 41 U/L _2 ALT 0 - 44 U/L _3 Lab Results  Component Value Date  WBC 12.7 (H) 11/30/2020   HGB 8.4 (L) 11/30/2020   HCT 26.6 (L) 11/30/2020   MCV 95.3 11/30/2020   PLT 368 11/30/2020   NEUTROABS 8.7 (H) 11/30/2020    ASSESSMENT & PLAN:  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 Anti Her 2 therapy with Lapatinibstarted 09/17/2017-05/14/2019: Stopped for progression 3.I discontinuedtamoxifen and started her on letrozole 2.5 mg daily.05/14/2019 stopped for progression 4.Stereotactic radiosurgery 12/19/2017 to the new right parietal lobe  metastases. 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 Severe anemia: Asymptomatic we will watch. Previously received blood transfusion. Today's hemoglobin is 9 2. Chronic dry cough: I renewed a prescription for cough syrup 3.  Painful erythema of her hands: I discontinued Xeloda.  If it persistently may have to reduce the dosage of Tukysa. 4.  Blisters on the lips  Whole-body scans can be done in 6 weeks Brain MRI 11/27/2020: Unchanged brain mets she has an appointment with radiation oncology coming up.  Insurance issues: Patient tells me that: Hospital is not out of network for her insurance and that she is in contact with our case managers to figure out what she needs to do to continue her treatment.  Rash on the dorsum of the hands: Continuing to use moisturizers.  She is currently on Tukysa and that is the cause of the rash on the hands. We stopped to Tucatinib for the past couple of weeks.  She will resume it at 1 tablet twice a  day. If she tolerates this well then we will add Xeloda back up as well.  Return to clinic every 3 weeks for Herceptin treatments. I will see her in 6 weeks with labs, and follow-up.    No orders of the defined types were placed in this encounter.  The patient has a good understanding of the overall plan. she agrees with it. she will call with any problems that may develop before the next visit here.  Total time spent: 30 mins including face to face time and time spent for planning, charting and coordination of care  Rulon Eisenmenger, MD, MPH 11/30/2020  I, Molly Dorshimer, am acting as scribe for Dr. Nicholas Lose.  I have reviewed the above documentation for accuracy and completeness, and I agree with the above.

## 2020-11-30 NOTE — Patient Instructions (Signed)
[facility] MEDICAL ONCOLOGY  Discharge Instructions: Thank you for choosing [facility] to provide your oncology and hematology care.   If you have a lab appointment with the Cancer Center, please go directly to the Cancer Center and check in at the registration area.   Wear comfortable clothing and clothing appropriate for easy access to any Portacath or PICC line.   We strive to give you quality time with your provider. You may need to reschedule your appointment if you arrive late (15 or more minutes).  Arriving late affects you and other patients whose appointments are after yours.  Also, if you miss three or more appointments without notifying the office, you may be dismissed from the clinic at the provider's discretion.      For prescription refill requests, have your pharmacy contact our office and allow 72 hours for refills to be completed.    Today you received the following chemotherapy and/or immunotherapy agents trastuzumab      To help prevent nausea and vomiting after your treatment, we encourage you to take your nausea medication as directed.  BELOW ARE SYMPTOMS THAT SHOULD BE REPORTED IMMEDIATELY: *FEVER GREATER THAN 100.4 F (38 C) OR HIGHER *CHILLS OR SWEATING *NAUSEA AND VOMITING THAT IS NOT CONTROLLED WITH YOUR NAUSEA MEDICATION *UNUSUAL SHORTNESS OF BREATH *UNUSUAL BRUISING OR BLEEDING *URINARY PROBLEMS (pain or burning when urinating, or frequent urination) *BOWEL PROBLEMS (unusual diarrhea, constipation, pain near the anus) TENDERNESS IN MOUTH AND THROAT WITH OR WITHOUT PRESENCE OF ULCERS (sore throat, sores in mouth, or a toothache) UNUSUAL RASH, SWELLING OR PAIN  UNUSUAL VAGINAL DISCHARGE OR ITCHING   Items with * indicate a potential emergency and should be followed up as soon as possible or go to the Emergency Department if any problems should occur.  Please show the CHEMOTHERAPY ALERT CARD or IMMUNOTHERAPY ALERT CARD at check-in to  the Emergency Department and triage nurse.  Should you have questions after your visit or need to cancel or reschedule your appointment, please contact [facility]ICAL [facility]COL[facility]  Dept: [phone]  and follow the prompts.  Office hours are 8:00 a.m. to 4:30 p.m. Monday - Friday. Please note that voicemails left after 4:00 p.m. may not be returned until the following business day.  We are closed weekends and major holidays. You have access to a nurse at all times for urgent questions. Please call the main number to the clinic Dept: [phone] and follow the prompts.   For any non-urgent questions, you may also contact your provider using MyChart. We now offer e-Visits for anyone 18 and older to request care online for non-urgent symptoms. For details visit [email or web address].   Also download the MyChart app! Go to the app store, search "MyChart", open the app, select [facility], and log in with your MyChart username and password.  Due to Covid, a mask is required upon entering the hospital/clinic. If you do not have a mask, one will be given to you upon arrival. For doctor visits, patients may have 1 support person aged 18 or older with them. For treatment visits, patients cannot have anyone with them due to current Covid guidelines and our immunocompromised population.   

## 2020-12-01 ENCOUNTER — Other Ambulatory Visit (HOSPITAL_COMMUNITY): Payer: Self-pay

## 2020-12-02 ENCOUNTER — Ambulatory Visit
Admission: RE | Admit: 2020-12-02 | Discharge: 2020-12-02 | Disposition: A | Discharge status: Home / Self Care | Payer: No Typology Code available for payment source | Source: Ambulatory Visit | Attending: Urology | Admitting: Urology

## 2020-12-02 DIAGNOSIS — C7931 Secondary malignant neoplasm of brain: Secondary | ICD-10-CM

## 2020-12-02 NOTE — Progress Notes (Signed)
Radiation Oncology         [phone] ________________________________  Name: Melissa [name] MRN: [id number]  Date: 12/02/2020  DOB: 02/19/1966  Post Treatment Note  CC: Melissa [name], [name]  [name], [name], *  Diagnosis:   55 y.o. 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  Interval Since Last Radiation:  3 month 08/27/20: These five targets were treated to a prescription dose of 20 G in a single fraction.     05/13/20:  SRS brain to the following 4 targets treated to a prescription dose of 20 Gy in a single fraction with a single isocenter: PTV6 Mid Cerebellum 961m PTV7 Lt Occipital 465mPTV8 Lt Occipital 61m89mTV9 Lt Temporal 61mm54m9/14/2020:  SRS brain//PTV 5:  Right frontal 5 mm target was treated to a prescription dose of 20 Gy in a single fraction.   09/04/2018:   SRS Brain// Right Frontal, 2 targets / 20 Gy in 1 fraction PTV3: Ant Rt Frontal 11mm161my PTV4: Rt Frontal resection cavity 5mm  71my  12/18/2017: SRS brain//PTV2: 4 mm Rt Parietal lesion treated to 20 Gy in 1 Fx  08/25/2017, 08/28/2017, 08/30/2017, 09/01/2017, 09/04/2017: PTV1: post op SRS to right frontal lobe resection cavity in 5 fxs  Narrative:  In summary, she initially presented to the emergency Department on 07/27/2017 with complaints of headaches ongoing for approximately 5 weeks with associated nausea and vomiting. She was initially treated by her PCP for sinusitis without improvement.  She also has a history of migraine headaches and had ben evaluated in the ED at [facility]/05/2017 for treatment of what she thought was a typical migraine headache given the fact that she had her usual blurry vision and aura preceding the headache. She reports that the headache did improve with treatment but the relief was short-lived as the headache returned the very next day. She followed up with her primary care physician  and a CT of the head was going to be scheduled due to the persistent headaches but in the interim, [facility] severity of the headache with associated nausea and vomiting. The headaches were occuring in the frontal lobe as well as bilateral temporal with occasional radiation to the occipital lobe and associated with blurry vision, decreased appetite, fatigue, nausea and vomiting.  She denied any difficulty with speech, memory, imbalance or focal weakness.  CT Head on admission 07/27/2017 showed a large right frontal lobe mass measuring 3.1 by 2.5 cm with significant surrounding edema with mass effect and right to left midline shift measuring 8 mm. A subsequent Brain MRI showed a 3.4 x 2.9 x 2.9 cm right frontal lobe mass with imaging characteristics of solitary metastasis and extensive vasogenic edema resulting in 9 mm right to left midline shift as well as equivocal very early left ventricle entrapment.   CT Chest, Abdomen, Pelvis on 07/28/2017 for disease staging showed no findings to suggest metastatic breast cancer involving the chest, abdomen, pelvis or osseous structures. 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 Surgical changes involving the left breast and left axilla but no findings for local recurrence or regional adenopathy.  She proceeded with a gross total resection of the frontal lesion on 08/05/17 with Dr. [name] Cyndy Freezewed by adjuvant fractionated postop SRS radiotherapy to the resection cavity in February 2019. Final surgical pathology revealed  poorly differentiated adenocarcinoma consistent with metastatic breast cancer, ER/PR positive and HER-2 positive.   She tolerated SRS treatment well and initial post treatment MRI brain on 12/07/17 showed satisfactory appearance of the right anterior frontal lobe post treatment site with expected evolution but unfortunately also showed a single,  new 2 - 3 mm lesion in the right parietal lobe without associated edema or mass-effect.  She elected to undergo salvage SRS treatment to the new lesion in the parietal lobe which was completed on 12/18/17 and tolerated well.  A follow up brain MRI on 05/18/18 showed a stable right frontal resection cavity with stable 4 mm nodular enhancement at the inferior cavity margin but no evidence of new disease.  Unfortunately, the follow up MRI brain scan on 08/23/2018 showed a new 11 mm inferior right frontal lesion with only mild associated edema and no mass-effect.  She reported that she had been having more frequent frontal headaches which were not severe and responded to Aleve and otherwise, had been feeling well.  She elected to proceed with a single fraction of SRS to this new brain metastasis as recommended and this was completed on  09/04/18 and tolerated very well.   She had bronchoscopy with Dr. Valeta Harms in 05/2018 and dx'ed with Aspergillus- ID following (Dr. Graylon Good).  She also continues in routine follow up with Dr. Melvyn Novas in Pulmonology.  Follow up MRI brain scan from 03/08/19 showed continued interval enlargement of the nodular focus with enhancement at the superior margin of the right frontal resection cavity, measuring 5 x 6 mm in diameter compared with 3 x 4.5 mm on the previous study. Two smaller foci of enhancement along the inferior margin were unchanged and there was no evidence of increasing mass effect or edema and no new lesions seen elsewhere within the brain.  She elected to proceed with SRS treatment of the progressive nodular lesion which was completed on 03/25/19 and tolerated well without any acute ill effects.    She developed systemic disease progression, particularly in the chest with increase in size of enhancing nodular left internal mammary soft tissue mass, lymph nodes and left third rib lesion and soft tissue lesion eroding the left sternal body as well as a new, hypermetabolic 6.5 cm liver  lesion seen on PET scan from 05/23/2019 despite continuing onlapatinibwith Letrozole. She did have an ultrasound-guided biopsy of the liver mass on 06/05/2019 which confirmed metastatic breast cancer, HER-2 positive, ER/PR negative. Her systemic therapy was changed to [place] beginning on 06/19/2019, under the care and direction of Dr. Lindi Adie which she tolerated very well.  A follow-up CT chest from 07/19/2019 showed a positive response to treatment with decrease in size of the soft tissue mass in the breast as well as bony lesions and adenopathy.  Fortunately, her follow-up MRI brain from 08/02/2019 showed stability of the previously treated disease and no new or progressive findings. A repeat MRI brain scan 11/04/19 was felt to be overall stable but there was a slight change in a previously treated right inferior frontal lesion, measuring 5 mm, previously 4 mm, with a new, separate punctate focus of enhancement just posterior to that. This was such minimal change and when her prior treatment fields were fused with recent scan, it appeared that the new area of enhancement was on the field border of GTV3 and would have received a large portion of the delivered dose.  Therefore, consensus recommendation was to simply monitor this area with routine 3 month follow up brain imaging.  She had repeat MRI on 01/30/20 and this showed overall disease stability with an unchanged appearance of the 2 previously treated right frontal lobe deposits and no new or progressive disease. Repeat systemic disease staging scans were performed on 01/29/20 and 02/04/20.  The bone scan on 01/29/20 showed less uptake of tracer at the previously identified sternal and LEFT third rib lesions, consistent with response to therapy and there were no new sites of osseous metastasis noted. The CT C/A/P performed on 02/04/20 also showed disease stability with a new cluster of spiculated nodule or opacities in the posterior left lung favoring aspergillosis or  other atypical infectious etiology.  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  There was no definite evidence of metastatic disease within the abdomen or pelvis. She continued on Kadcycla, s/p 14 cycles as of 05/05/20.   Repeat MRI brain from 05/01/20 showed 4 new metastatic lesions, all subcentimeter with a 76m midline cerebellar vermian metastasis, 448mleft occipital metastasis, 67m43might occipital metastasis and a punctate lesion in the left temporal lobe.  The concensus recommendation was to proceed with a single fraction of SRS to the 4 new metastases which she was in agreement with and was completed on 05/13/21.  Her restaging systemic imaging from 05/28/20 was without evidence of any disease progression or recurrence in the chest, abdomen or pelvis.  At the time of her follow up with Dr. GudLindi Adie 05/05/2020, the decision was made to discontinue the Kadcycla, due to disease progression in the brain, and switch to Tucatinib, oral systemic therapy in combination with Herceptin and Xeloda which was started 06/11/20.  Her follow up brain MRI from 08/13/20 unfortunately showed disease progression with 5 new lesions: with a 2 mm left frontal lesion, 2 mm right frontal lesion, 2 mm right occipital lesion, 2 mm right temporal lesion and a 5 mm left temporal lesion.  The 1 mm right occipital lesion was initially thought to be new but on further review of prior treatment plans, it was noted to have been treated previously and stable.  Otherwise, the other previously treated lesions were improved and/or stable.  The consensus recommendation at brain conference was to proceed with a single fraction of SRS to the 5 new brain lesions which she was in agreement with. Her most recent SRSScappooseeatment was completed 08/27/20 and tolerated very well without any ill side effects  aside from some low grade nausea for the first 48 hours but has since resolved completely.      Interval History:   I spoke with the Melissa [name] to conduct her routine scheduled 3 month follow up visit via telephone to spare the Melissa [name] unnecessary potential exposure in the healthcare setting during the current COVID-19 pandemic.  The Melissa [name] was notified in advance and gave permission to proceed with this visit format. She reports that she has remained clinically stable since her last treatment and is currently without complaints.  She has continued to tolerate her systemic therapy fairly well aside from fatigue and the development of a rash on her hands felt secondary to the Tucatinib/Xeloda. These medications were held for 2 weeks and the rash improved so she has recently resumed the Tucatinib at 1 po qd with plans to add Xeloda back in the near future if she tolerates the Tucatinib at the reduced dose.    On review of  systems, the Melissa [name] states that she is doing well overall.  She is without complaints aside from fatigue and chronic cough.  She has continued with improved energy levels and has been able to continue to work and has recently moved her parents to a retirement facility. She denies headaches, decreased visual or auditory acuity, tinnitus, tremor, focal weakness or seizure activity. She is not having difficulty with speech or word finding.  She denies abdominal pain, N/V or diarrhea.  She continues on systemic therapy with Herceptin andTucatinib, at a reduced dose, and is tolerating this well. The plan is to add Xeloda back in the regimen in the near future pending she continues to tolerate the Tucatinib reduced dose and the painful rash on her hands does not return. She reports a healthy appetite and is maintaining her weight which she is pleased with.  ALLERGIES:  is allergic to aspirin, protonix [pantoprazole], and iodinated diagnostic agents.  Meds: Current Outpatient Medications   Medication Sig Dispense Refill  . albuterol (VENTOLIN HFA) 108 (90 Base) MCG/ACT inhaler Inhale 2 puffs into the lungs every 6 (six) hours as needed. 8 g 5  . budesonide-formoterol (SYMBICORT) 160-4.5 MCG/ACT inhaler Inhale 2 puffs into the lungs 2 (two) times daily. 3 each 3  . chlorpheniramine-HYDROcodone (TUSSIONEX) 10-8 MG/5ML SUER Take 5 mLs by mouth at bedtime. 115 mL 0  . ELDERBERRY PO Take by mouth daily. Per Melissa [name], takes one daily    . levalbuterol (XOPENEX) 0.63 MG/3ML nebulizer solution Take 3 mLs (0.63 mg total) by nebulization every 4 (four) hours as needed for wheezing or shortness of breath. 540 mL 6  . lidocaine-prilocaine (EMLA) cream APPLY EXTERNALLY TO THE AFFECTED AREA 1 TIME 30 g 3  . naproxen sodium (ALEVE) 220 MG tablet Take 440 mg by mouth 2 (two) times daily as needed (headache).     Marland Kitchen omeprazole (PRILOSEC) 40 MG capsule TAKE 1 CAPSULE(40 MG) BY MOUTH DAILY 30 capsule 9  . predniSONE (DELTASONE) 50 MG tablet Take 1 tablet 13 hours, 7 hours, and 1 hour prior to scans. 6 tablet 0  . voriconazole (VFEND) 200 MG tablet Take 1 tablet (200 mg total) by mouth 2 (two) times daily. 60 tablet 11  . capecitabine (XELODA) 500 MG tablet TAKE 2 TABLETS (1,000 MG TOTAL) BY MOUTH TWICE DAILY AFTER A MEAL. TAKE FOR 14 DAYS ON AND 7 DAYS OFF (Melissa [name] not taking: Reported on 11/26/2020) 56 tablet 3  . tucatinib (TUKYSA) 150 MG tablet Take 1 tablet (150 mg total) by mouth in the morning and at bedtime. 120 tablet 3   No current facility-administered medications for this encounter.    Physical Findings:  vitals were not taken for this visit.   /Unable to assess due to telephone follow-up visit format.   Lab Findings: Lab Results  Component Value Date   WBC 12.7 (H) 11/30/2020   HGB 8.4 (L) 11/30/2020   HCT 26.6 (L) 11/30/2020   MCV 95.3 11/30/2020   PLT 368 11/30/2020     Radiographic Findings: MR Brain W Wo Contrast  Result Date: 11/27/2020 CLINICAL DATA:  Brain/CNS  neoplasm, surveillance 3T SRS Protocol. History of breast carcinoma. Status post chemotherapy and radiation. EXAM: MRI HEAD WITHOUT AND WITH CONTRAST TECHNIQUE: Multiplanar, multiecho pulse sequences of the brain and surrounding structures were obtained without and with intravenous contrast. CONTRAST:  4.34m GADAVIST GADOBUTROL 1 MMOL/ML IV SOLN COMPARISON:  Brain MRI 08/13/2020 FINDINGS: Brain: No acute infarct, mass effect or extra-axial collection. No acute or chronic hemorrhage.  There is an area of gliosis and encephalomalacia in the right frontal lobe. Contrast enhancing lesions are as follows: 1. 1.4 cm area enhancement at the inferior frontal pole is unchanged, series 1200 image 178. 2. Right frontal, adjacent to treatment site, 8 mm, unchanged, image 213 3. Unchanged superior right frontal lesion measuring 2-3 mm image 240 4. Punctate left occipital lesion, unchanged, image 163 5. Cerebellar vermis 2 mm, unchanged, image 152 6. Anterior left parietal lobe, postcentral gyrus, 1 mm, unchanged, image 206 7. Right occipital, 2 mm unchanged image 178 The other lesions previously described are not visible on the current study. Of note, the lateral aspect of the right temporal lobe is obscured from view on the current study. There is no edema surrounding any of the lesions. Vascular: Major flow voids are preserved. Skull and upper cervical spine: Prior right frontal craniotomy. Mild underlying dural thickening. Sinuses/Orbits:No paranasal sinus fluid levels or advanced mucosal thickening. No mastoid or middle ear effusion. Normal orbits. IMPRESSION: 1. Unchanged size and appearance of multiple intracranial metastatic lesions. No new lesions. 2. Some lesions have resolved and/or are not visible on the current study. Of note, the lateral aspect of the right temporal lobe is obscured from view on the current study. Electronically Signed   By: Ulyses Jarred M.D.   On: 11/27/2020 21:50    Impression/Plan: 1. 55 y.o.  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 She appears to have recovered well form the effects of radiotherapy and is currently without complaints.  We discussed the plan to resume surveillance imaging every 3 months going forward.  I will contact her by phone following each scan to review results and recommendations from brain tumor board but she knows that she is welcome to call at anytime in the interim with any questions or concerns.  She will also continue in routine follow-up under the care and direction of Dr. Lindi Adie for continued management of her systemic disease.  Her next scheduled visit in medical oncology is on 01/12/21 with plans for repeat systemic imaging prior to that visit.  Given current concerns for Melissa [name] exposure during the COVID-19 pandemic, this encounter wasconductedviatelephone. The Melissa [name] was notified in advance and was offered a [place] meeting to allow for face to face communication but unfortunately reported that she did not have the appropriate resources/technology to support such a visit and instead preferred to proceed with telephone follow up visit. The Melissa [name] has given verbal consent for this type of encounter. The time spent during this encounter was36mnutes with 50% of time spent in coordination of care. The attendants for this meeting includeAshlyn Jyquan Kenley PA-C,andpatient, KMakaylah Oddo During the encounter, Yamilee Harmes PA-C [facility].  Melissa [name],Melissa [name] waslocated at home.   ANicholos Johns PA-C    MTyler Pita MD  [facility] Direct Dial: [phone] Fax: [phone]  Skype  LinkedIn

## 2020-12-09 ENCOUNTER — Other Ambulatory Visit: Payer: Self-pay | Admitting: Radiation Therapy

## 2020-12-09 ENCOUNTER — Other Ambulatory Visit (HOSPITAL_COMMUNITY): Payer: Self-pay

## 2020-12-09 DIAGNOSIS — C7931 Secondary malignant neoplasm of brain: Secondary | ICD-10-CM

## 2020-12-14 ENCOUNTER — Telehealth: Payer: Self-pay | Admitting: *Deleted

## 2020-12-14 NOTE — Progress Notes (Signed)
 Patient Care Team: Patient, [name] as PCP - General (General Practice) Mortenson, Ashley, MD as Referring Physician (Emergency Medicine)  DIAGNOSIS:    ICD-10-CM   1. 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 Malignant neoplasm of upper-inner quadrant of left breast in female, estrogen receptor negative (HCC)  C50.212    Z17.1     SUMMARY OF ONCOLOGIC HISTORY: Oncology History  Breast cancer of upper-inner quadrant of left female breast (HCC)  06/08/2012 Initial Diagnosis   invasive ductal carcinoma that was ER positive PR positive HER-2/neu positive measuring 3.1 cm by MRI criteria. Ki-67 was 70% HER-2 was amplified with a ratio 2.91   07/12/2012 - 07/17/2013 Neo-Adjuvant Chemotherapy   TCH 6 followed by Herceptin maintenance   12/11/2012 Surgery   Left breast lumpectomy: 1.8 cm tumor 1 positive sentinel node, axillary lymph node dissection 02/08/2013 showed 0/13 lymph nodes   03/25/2013 - 05/06/2013 Radiation Therapy   Adjuvant radiation therapy   06/05/2013 - 07/20/2017 Anti-estrogen oral therapy   Tamoxifen 20 mg daily   07/27/2017 Relapse/Recurrence   MRI Brain: 3.4 x 2.9 x 2.9 cm RIGHT frontal lobe mass with imaging characteristics of solitary metastasis. Extensive vasogenic edema resulting in 9 mm RIGHT to LEFT midline shift. Equivocal very early LEFT ventricle entrapment.    08/04/2017 Surgery   Rt frontal brain resection: Poorly differentiated tumor IHC suggests breast primary ER and PR Positive   08/25/2017 - 09/04/2017 Radiation Therapy   Stereotactic radiation   09/18/2017 -  Anti-estrogen oral therapy   Lapatinib with letrozole   12/18/2017 - 12/19/2017 Radiation Therapy   New right parietal lobe metastases status post SRS   08/27/2018 - 08/27/2020 Radiation Therapy   SRS to new brain metastases   05/09/2019 Relapse/Recurrence   Interval increase in size of the enhancing nodular left internal mammary soft tissue 2.8 cm.   Redemonstrated enlarged supraclavicular, lower cervical and lower posterior cervical nodes unchanged.  Interval increase in the bony erosion of the posterior and lateral left third rib, increasing soft tissue lesion eroding the left sternal body 3.1 cm was 2.5 cm.  Bronchiectatic changes   06/19/2019 - 05/05/2020 Chemotherapy   The patient had ado-trastuzumab emtansine (KADCYLA) 180 mg in sodium chloride 0.9 % 250 mL chemo infusion, 3.6 mg/kg = 180 mg, Intravenous, Once, 16 of 16 cycles Administration: 180 mg (06/19/2019), 180 mg (07/10/2019), 180 mg (09/17/2019), 180 mg (08/05/2019), 180 mg (08/27/2019), 180 mg (10/08/2019), 180 mg (10/29/2019), 180 mg (11/19/2019), 200 mg (12/10/2019), 200 mg (12/31/2019), 200 mg (01/20/2020), 200 mg (02/11/2020), 200 mg (03/03/2020), 200 mg (03/24/2020), 200 mg (04/14/2020), 200 mg (05/05/2020)  for chemotherapy treatment.    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    Port-A-Cath in place    CHIEF COMPLIANT:   Follow-up of metastatic breast cancer, blisters  INTERVAL HISTORY: Melissa [name] is a 55 y.o. with above-mentioned history of metastatic breast cancer with brain metastasiss/presection who is currently ontreatment withTucatinib with Xeloda and Herceptin.She  presents to the clinic todayforevaluation of blisters on bilateral hands, lips, and neck.   ALLERGIES:  is allergic to aspirin, protonix [pantoprazole], and iodinated diagnostic agents.  MEDICATIONS:  Current Outpatient Medications  Medication Sig Dispense Refill  . betamethasone valerate ointment (VALISONE) 0.1 % Apply 1 application topically 2 (two) times daily. 30 g 0  . methylPREDNISolone (MEDROL DOSEPAK) 4 MG TBPK tablet Use as directed 21 tablet 0  . albuterol (VENTOLIN HFA) 108 (90 Base) MCG/ACT inhaler Inhale 2 puffs into the lungs every 6 (six) hours as needed. 8 g 5  . budesonide-formoterol (SYMBICORT) 160-4.5 MCG/ACT inhaler Inhale 2 puffs into the lungs 2 (two) times daily. 3 each 3  . chlorpheniramine-HYDROcodone (TUSSIONEX) 10-8 MG/5ML SUER Take 5 mLs by mouth at bedtime. 115 mL 0  . ELDERBERRY PO Take by mouth daily. Per patient, takes one daily    . levalbuterol (XOPENEX) 0.63 MG/3ML nebulizer solution Take 3 mLs (0.63 mg total) by nebulization every 4 (four) hours as needed for wheezing or shortness of breath. 540 mL 6  . lidocaine-prilocaine (EMLA) cream APPLY EXTERNALLY TO THE AFFECTED AREA 1 TIME 30 g 3  . naproxen sodium (ALEVE) 220 MG tablet Take 440 mg by mouth 2 (two) times daily as needed (headache).     Marland Kitchen omeprazole (PRILOSEC) 40 MG capsule TAKE 1 CAPSULE(40 MG) BY MOUTH DAILY 30 capsule 9  . predniSONE (DELTASONE) 50 MG tablet Take 1 tablet 13 hours, 7 hours, and 1 hour prior to scans. 6 tablet 0  . voriconazole (VFEND) 200 MG tablet Take 1 tablet (200 mg total) by mouth 2 (two) times daily. 60 tablet 11   No current facility-administered medications for this visit.    PHYSICAL EXAMINATION: ECOG PERFORMANCE STATUS: 1 - Symptomatic but completely ambulatory  Vitals:   12/15/20 1334  BP: 100/60  Pulse: 88  Resp: 16  Temp: 97.7 F (36.5 C)  SpO2: 100%   Filed Weights   12/15/20 1334  Weight: 100 lb 3.2 oz (45.5 kg)    LABORATORY DATA:  I have  reviewed the data as listed CMP Latest Ref Rng & Units 11/30/2020 11/09/2020 10/20/2020  Glucose 70 - 99 mg/dL 84 156(H) 107(H)  BUN 6 - 20 mg/dL $Remove'14 18 14  'hxvmYmu$ Creatinine 0.44 - 1.00 mg/dL 0.76 0.86 0.98  Sodium 135 - 145 mmol/L 140 137 139  Potassium 3.5 - 5.1 mmol/L 4.3 3.8 4.0  Chloride 98 - 111 mmol/L 108 106 106  CO2 22 - 32 mmol/L $RemoveB'25 23 24  'HJkZOUfm$ Calcium 8.9 - 10.3 mg/dL 8.9 9.1 9.3  Total Protein 6.5 - 8.1 g/dL 6.9 7.1 7.2  Total Bilirubin 0.3 - 1.2 mg/dL <0.2(L) <0.2(L) 0.3  Alkaline Phos 38 - 126 U/L 154(H) 185(H) 219(H)  AST 15 - 41 U/L 14(L) 20 25  ALT 0 - 44 U/L $Remo'8 9 12    'NUfBP$ Lab Results  Component Value Date   WBC 12.7 (H) 11/30/2020   HGB 8.4 (L) 11/30/2020   HCT 26.6 (L) 11/30/2020   MCV 95.3 11/30/2020   PLT 368 11/30/2020   NEUTROABS 8.7 (H) 11/30/2020    ASSESSMENT & PLAN:  Breast cancer of upper-inner quadrant of left female breast (HCC) 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 Anti Her 2 therapy with Lapatinibstarted 09/17/2017-05/14/2019: Stopped for progression 3.I discontinuedtamoxifen and started her on letrozole 2.5 mg daily.05/14/2019 stopped for progression 4.Stereotactic radiosurgery 12/19/2017 to the new right parietal lobe metastases. 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 Previously received blood transfusion. Today's hemoglobin is 9 2.Chronic dry cough: I renewed a prescription for cough syrup 3.Painful erythema of her hands: I discontinued Xeloda. If it persistently may have to reduce the dosage of Tukysa. 4.Blisters on the lips  Rash on the dorsum of the hands: Continuing to use moisturizers.  She is currently on Tukysa and that is the cause of the rash on the hands. Will resume Tucatinib at 1 tablet twice a day.  Unfortunately the rash appears to be getting worse.  Therefore we will have to permanently discontinue [place]. I sent a prescription for betamethasone ointment and also Medrol Dosepak to treat the rash.  Return to clinic in 1 week to assess the rash and also for her to receive Herceptin treatment.   No orders of the defined types were placed in this encounter.  The patient has a good understanding of the overall plan. she agrees with it. she will call with any problems that may develop before the next visit here.  Total time spent: 30 mins including face to face time and time spent for planning, charting and coordination of care  Rulon Eisenmenger, MD, MPH 12/15/2020  I, Cloyde Reams Dorshimer,  am acting as scribe for Dr. Nicholas Lose.  I have reviewed the above documentation for accuracy and completeness, and I agree with the above.

## 2020-12-14 NOTE — Telephone Encounter (Signed)
Received call from pt with complaint of blisters on bilateral hands, lips and neck.  Pt states blisters on lips are bleeding and causing difficulties with eating.  Pt requesting to be seen by provider for further evaluation and tx,  Apt scheduled and pt verbalized understanding of apt date and time.

## 2020-12-15 ENCOUNTER — Inpatient Hospital Stay: Payer: No Typology Code available for payment source | Attending: Medical | Admitting: Hematology and Oncology

## 2020-12-15 ENCOUNTER — Other Ambulatory Visit: Payer: Self-pay

## 2020-12-15 VITALS — BP 100/60 | HR 88 | Temp 97.7°F | Resp 16 | Ht 62.0 in | Wt 100.2 lb

## 2020-12-15 DIAGNOSIS — C7931 Secondary malignant neoplasm of brain: Secondary | ICD-10-CM

## 2020-12-15 DIAGNOSIS — C773 Secondary and unspecified malignant neoplasm of axilla and upper limb lymph nodes: Secondary | ICD-10-CM | POA: Insufficient documentation

## 2020-12-15 DIAGNOSIS — Z5112 Encounter for antineoplastic immunotherapy: Secondary | ICD-10-CM | POA: Insufficient documentation

## 2020-12-15 DIAGNOSIS — Z171 Estrogen receptor negative status [ER-]: Secondary | ICD-10-CM

## 2020-12-15 DIAGNOSIS — C50912 Malignant neoplasm of unspecified site of left female breast: Secondary | ICD-10-CM | POA: Diagnosis not present

## 2020-12-15 DIAGNOSIS — R21 Rash and other nonspecific skin eruption: Secondary | ICD-10-CM | POA: Diagnosis not present

## 2020-12-15 DIAGNOSIS — C7951 Secondary malignant neoplasm of bone: Secondary | ICD-10-CM | POA: Insufficient documentation

## 2020-12-15 DIAGNOSIS — C50212 Malignant neoplasm of upper-inner quadrant of left female breast: Secondary | ICD-10-CM | POA: Diagnosis present

## 2020-12-15 DIAGNOSIS — C787 Secondary malignant neoplasm of liver and intrahepatic bile duct: Secondary | ICD-10-CM | POA: Insufficient documentation

## 2020-12-15 MED ORDER — BETAMETHASONE VALERATE 0.1 % EX OINT: 1.0000 "application " | TOPICAL_OINTMENT | Freq: Two times a day (BID) | CUTANEOUS | 0 refills | Status: DC

## 2020-12-15 MED ORDER — METHYLPREDNISOLONE 4 MG PO TBPK: ORAL_TABLET | ORAL | 0 refills | Status: DC

## 2020-12-15 NOTE — Assessment & Plan Note (Signed)
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 Anti Her 2 therapy with Lapatinibstarted 09/17/2017-05/14/2019: Stopped for progression 3.I discontinuedtamoxifen and started her on letrozole 2.5 mg daily.05/14/2019 stopped for progression 4.Stereotactic radiosurgery 12/19/2017 to the new right parietal lobe metastases. 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 Previously received blood transfusion. Today's hemoglobin is 9 2.Chronic dry cough: I renewed a  prescription for cough syrup 3.Painful erythema of her hands: I discontinued Xeloda. If it persistently may have to reduce the dosage of Tukysa. 4.Blisters on the lips  Rash on the dorsum of the hands: Continuing to use moisturizers.  She is currently on Tukysa and that is the cause of the rash on the hands. Will resume Tucatinib at 1 tablet twice a day.  Unfortunately the rash appears to be getting worse.  Therefore we will have to permanently discontinue [place].  Return to clinic every 3 weeks for Herceptin treatments. Return to clinic to see me after scans.

## 2020-12-16 ENCOUNTER — Telehealth: Payer: Self-pay | Admitting: Hematology and Oncology

## 2020-12-16 NOTE — Telephone Encounter (Signed)
Scheduled per 6/7 los. Called and spoke with pt and confirmed added MD appt

## 2020-12-21 ENCOUNTER — Other Ambulatory Visit (HOSPITAL_COMMUNITY): Payer: Self-pay

## 2020-12-21 NOTE — Progress Notes (Signed)
 Patient Care Team: Patient, [name] as PCP - General (General Practice) Mortenson, Ashley, MD as Referring Physician (Emergency Medicine)  DIAGNOSIS:    ICD-10-CM   1. Malignant neoplasm of upper-inner quadrant of left breast in female, estrogen receptor negative (HCC)  C50.212 chlorpheniramine-HYDROcodone (TUSSIONEX) 10-8 MG/5ML SUER   Z17.1     2. 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 Ki-67 was 70% HER-2 was amplified with a ratio 2.91    07/12/2012 - 07/17/2013 Neo-Adjuvant Chemotherapy   TCH 6 followed by Herceptin maintenance    12/11/2012 Surgery   Left breast lumpectomy: 1.8 cm tumor 1 positive sentinel node, axillary lymph node dissection 02/08/2013 showed 0/13 lymph nodes    03/25/2013 - 05/06/2013 Radiation Therapy   Adjuvant radiation therapy    06/05/2013 - 07/20/2017 Anti-estrogen oral therapy   Tamoxifen 20 mg daily    07/27/2017 Relapse/Recurrence   MRI Brain: 3.4 x 2.9 x 2.9 cm RIGHT frontal lobe mass with imaging characteristics of solitary metastasis. Extensive vasogenic edema resulting in 9 mm RIGHT to LEFT midline shift. Equivocal very early LEFT ventricle entrapment.    08/04/2017 Surgery   Rt frontal brain resection: Poorly differentiated tumor IHC suggests breast primary ER and PR Positive    08/25/2017 - 09/04/2017 Radiation Therapy   Stereotactic radiation    09/18/2017 -  Anti-estrogen oral therapy   Lapatinib with letrozole    12/18/2017 - 12/19/2017 Radiation Therapy   New right parietal lobe metastases status post SRS    08/27/2018 - 08/27/2020 Radiation Therapy   SRS to new brain  metastases   05/09/2019 Relapse/Recurrence   Interval increase in size of the enhancing nodular left internal mammary soft tissue 2.8 cm.  Redemonstrated enlarged supraclavicular, lower cervical and lower posterior cervical nodes unchanged.  Interval increase in the bony erosion of the posterior and lateral left third rib, increasing soft tissue lesion eroding the left sternal body 3.1 cm was 2.5 cm.  Bronchiectatic changes   06/19/2019 - 05/05/2020 Chemotherapy   The patient had ado-trastuzumab emtansine (KADCYLA) 180 mg in sodium chloride 0.9 % 250 mL chemo infusion, 3.6 mg/kg = 180 mg, Intravenous, Once, 16 of 16 cycles Administration: 180 mg (06/19/2019), 180 mg (07/10/2019), 180 mg (09/17/2019), 180 mg (08/05/2019), 180 mg (08/27/2019), 180 mg (10/08/2019), 180 mg (10/29/2019), 180 mg (11/19/2019), 200 mg (12/10/2019), 200 mg (12/31/2019), 200 mg (01/20/2020), 200 mg (02/11/2020), 200 mg (03/03/2020), 200 mg (03/24/2020), 200 mg (04/14/2020), 200 mg (05/05/2020)   for chemotherapy treatment.     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     Port-A-Cath in place    CHIEF COMPLIANT: Follow-up of metastatic breast cancer  INTERVAL HISTORY: Melissa [name] is a 55 y.o. with above-mentioned history of  metastatic breast cancer with brain metastasis s/p resection who is currently on treatment with Xeloda and Herceptin. She presents to the clinic today for treatment evaluation of rash and blisters.  The redness on the hands has improved significantly.  The blisters on the lips have also improved.  However she has pustular lesions on the dorsum of her hands especially the left hand.  She tells me that she when she bites them open it is purulent discharge.  She does not have any fevers or chills.  ALLERGIES:  is allergic to aspirin, protonix [pantoprazole], and iodinated diagnostic agents.  MEDICATIONS:  Current Outpatient Medications  Medication Sig Dispense Refill   amoxicillin-clavulanate (AUGMENTIN) 875-125 MG tablet Take 1 tablet by mouth 2 (two) times daily for 14 days. 28 tablet 0   albuterol (VENTOLIN HFA) 108 (90 Base) MCG/ACT inhaler Inhale 2 puffs into the lungs every 6 (six) hours as needed. 8 g 5   betamethasone valerate ointment (VALISONE) 0.1 % Apply 1 application topically 2 (two) times daily. 30 g 0   budesonide-formoterol (SYMBICORT) 160-4.5 MCG/ACT inhaler Inhale 2 puffs into the lungs 2 (two) times daily. 3 each 3   chlorpheniramine-HYDROcodone (TUSSIONEX) 10-8 MG/5ML SUER Take 5 mLs by mouth at bedtime. 115 mL 0   ELDERBERRY PO Take by mouth daily. Per patient, takes one daily     levalbuterol (XOPENEX) 0.63 MG/3ML nebulizer solution Take 3 mLs (0.63 mg total) by nebulization every 4 (four) hours as needed for wheezing or shortness of breath. 540 mL 6   lidocaine-prilocaine (EMLA) cream APPLY EXTERNALLY TO THE AFFECTED AREA 1 TIME 30 g 3   naproxen sodium (ALEVE) 220 MG tablet Take 440 mg by mouth 2 (two) times daily as needed (headache).      omeprazole (PRILOSEC) 40 MG capsule TAKE 1 CAPSULE(40 MG) BY MOUTH DAILY 30 capsule 9   predniSONE (DELTASONE) 50 MG tablet Take 1 tablet 13 hours, 7 hours, and 1 hour prior to scans. 6 tablet 0   voriconazole (VFEND) 200 MG tablet Take 1  tablet (200 mg total) by mouth 2 (two) times daily. 60 tablet 11   No current facility-administered medications for this visit.    PHYSICAL EXAMINATION: ECOG PERFORMANCE STATUS: 2  Vitals:   12/22/20 0902  BP: (!) 104/58  Pulse: 71  Resp: 18  Temp: (!) 97 F (36.1 C)  SpO2: 100%   Filed Weights   12/22/20 0902  Weight: 97 lb 1.6 oz (44 kg)    LABORATORY DATA:  I have reviewed the data as listed CMP Latest Ref Rng & Units 11/30/2020 11/09/2020 10/20/2020  Glucose 70 - 99 mg/dL 84 156(H) 107(H)  BUN 6 - 20 mg/dL _0 Creatinine 0.44 - 1.00 mg/dL 0.76 0.86 0.98  Sodium 135 - 145 mmol/L 140 137 139  Potassium 3.5 - 5.1 mmol/L 4.3 3.8 4.0  Chloride 98 - 111 mmol/L 108 106 106  CO2 22 - 32 mmol/L _1 Calcium 8.9 - 10.3 mg/dL 8.9 9.1 9.3  Total Protein 6.5 - 8.1 g/dL 6.9 7.1 7.2  Total Bilirubin 0.3 - 1.2 mg/dL <0.2(L) <0.2(L) 0.3  Alkaline Phos 38 - 126 U/L 154(H) 185(H) 219(H)  AST 15 - 41 U/L 14(L)  20 25  ALT 0 - 44 U/L _0 Lab Results  Component Value Date   WBC 12.7 (H) 11/30/2020   HGB 8.4 (L) 11/30/2020   HCT 26.6 (L) 11/30/2020   MCV 95.3 11/30/2020   PLT 368 11/30/2020   NEUTROABS 8.7 (H) 11/30/2020    ASSESSMENT & PLAN:  Breast cancer of upper-inner quadrant of left female breast (HCC) 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  [place] brain: 08/25/2017-09/04/2017 2. Anti Her 2 therapy with Lapatinib started 09/17/2017-05/14/2019: Stopped for progression 3.  I discontinued tamoxifen and started her on letrozole 2.5 mg daily.  05/14/2019 stopped for  progression 4.  Stereotactic radiosurgery 12/19/2017 to the new right parietal lobe metastases. 5.  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 Severe anemia: Asymptomatic we will watch.  Previously received blood transfusion.  Today's hemoglobin is 9 2. Chronic dry cough: I renewed a prescription for cough syrup 3.  Painful erythema of her hands: I discontinued Xeloda and Tukysa. 4.  Blisters on the lips   Rash on the dorsum of the hands: Continuing to use moisturizers.  Tucatinib has been discontinued.  We prescribed her Medrol Dosepak and a prescription for betamethasone ointment.  Significant improvement in the redness. Pustular lesions on the dorsum of the hand: Sent a prescription for Augmentin for 2 weeks.  Encouraged her to take yogurt or probiotics.  Return to clinic in 3 weeks for Herceptin    No orders of the defined types were placed in this encounter.  The patient has a good understanding of the  overall plan. she agrees with it. she will call with any problems that may develop before the next visit here.  Total time spent: 30 mins including face to face time and time spent for planning, charting and coordination of care  Rulon Eisenmenger, MD, MPH 12/22/2020  I, Molly Dorshimer, am acting as scribe for Dr. Nicholas Lose.  I have reviewed the above documentation for accuracy and completeness, and I agree with the above.

## 2020-12-22 ENCOUNTER — Other Ambulatory Visit: Payer: Self-pay

## 2020-12-22 ENCOUNTER — Inpatient Hospital Stay: Payer: No Typology Code available for payment source

## 2020-12-22 ENCOUNTER — Encounter: Payer: Self-pay | Admitting: Hematology and Oncology

## 2020-12-22 ENCOUNTER — Inpatient Hospital Stay (HOSPITAL_BASED_OUTPATIENT_CLINIC_OR_DEPARTMENT_OTHER): Payer: No Typology Code available for payment source | Admitting: Hematology and Oncology

## 2020-12-22 DIAGNOSIS — C7931 Secondary malignant neoplasm of brain: Secondary | ICD-10-CM | POA: Diagnosis not present

## 2020-12-22 DIAGNOSIS — C50912 Malignant neoplasm of unspecified site of left female breast: Secondary | ICD-10-CM

## 2020-12-22 DIAGNOSIS — C50212 Malignant neoplasm of upper-inner quadrant of left female breast: Secondary | ICD-10-CM | POA: Diagnosis not present

## 2020-12-22 DIAGNOSIS — Z171 Estrogen receptor negative status [ER-]: Secondary | ICD-10-CM | POA: Diagnosis not present

## 2020-12-22 DIAGNOSIS — Z5112 Encounter for antineoplastic immunotherapy: Secondary | ICD-10-CM | POA: Diagnosis not present

## 2020-12-22 MED ORDER — DIPHENHYDRAMINE HCL 25 MG PO CAPS
25.0000 mg | ORAL_CAPSULE | Freq: Once | ORAL | Status: AC
  Administered 2020-12-22: 25 mg via ORAL

## 2020-12-22 MED ORDER — SODIUM CHLORIDE 0.9% FLUSH
10.0000 mL | INTRAVENOUS | Status: DC | PRN
  Administered 2020-12-22: 10 mL
  Filled 2020-12-22: qty 10

## 2020-12-22 MED ORDER — SODIUM CHLORIDE 0.9 % IV SOLN
Freq: Once | INTRAVENOUS | Status: AC
  Filled 2020-12-22: qty 250

## 2020-12-22 MED ORDER — TRASTUZUMAB-DKST CHEMO 150 MG IV SOLR
264.0000 mg | Freq: Once | INTRAVENOUS | Status: AC
  Administered 2020-12-22: 264 mg via INTRAVENOUS
  Filled 2020-12-22: qty 12.57

## 2020-12-22 MED ORDER — HEPARIN SOD (PORK) LOCK FLUSH 100 UNIT/ML IV SOLN
500.0000 [IU] | Freq: Once | INTRAVENOUS | Status: AC | PRN
  Administered 2020-12-22: 500 [IU]
  Filled 2020-12-22: qty 5

## 2020-12-22 MED ORDER — DIPHENHYDRAMINE HCL 25 MG PO CAPS
ORAL_CAPSULE | ORAL | Status: AC
  Filled 2020-12-22: qty 1

## 2020-12-22 MED ORDER — ACETAMINOPHEN 325 MG PO TABS
ORAL_TABLET | ORAL | Status: AC
  Filled 2020-12-22: qty 2

## 2020-12-22 MED ORDER — ACETAMINOPHEN 325 MG PO TABS
650.0000 mg | ORAL_TABLET | Freq: Once | ORAL | Status: AC
  Administered 2020-12-22: 650 mg via ORAL

## 2020-12-22 MED ORDER — HYDROCOD POLST-CPM POLST ER 10-8 MG/5ML PO SUER: 5.0000 mL | Freq: Every day | ORAL | 0 refills | Status: DC

## 2020-12-22 MED ORDER — AMOXICILLIN-POT CLAVULANATE 875-125 MG PO TABS: 1.0000 | ORAL_TABLET | Freq: Two times a day (BID) | ORAL | 0 refills | Status: AC

## 2020-12-22 MED ORDER — ALTEPLASE 2 MG IJ SOLR
INTRAMUSCULAR | Status: AC
  Filled 2020-12-22: qty 2

## 2020-12-22 NOTE — Progress Notes (Signed)
Blood return noted at time of port access with numerous flushes & position changes by the patient, infusion completed without incident, no blood return noted when port deaccessed, numerous flushes done.  Will monitor with next port access.

## 2020-12-22 NOTE — Patient Instructions (Signed)
[facility] MEDICAL [facility]COLOGY  Discharge Instructions: Thank you for choosing [facility] to provide your oncology and hematology care.   If you have a lab appointment with the Cancer Center, please go directly to the Cancer Center and check in at the registration area.   Wear comfortable clothing and clothing appropriate for easy access to any Portacath or PICC line.   We strive to give you quality time with your provider. You may need to reschedule your appointment if you arrive late (15 or more minutes).  Arriving late affects you and other patients whose appointments are after yours.  Also, if you miss three or more appointments without notifying the office, you may be dismissed from the clinic at the provider's discretion.      For prescription refill requests, have your pharmacy contact our office and allow 72 hours for refills to be completed.    Today you received the following chemotherapy and/or immunotherapy agents Ogivri      To help prevent nausea and vomiting after your treatment, we encourage you to take your nausea medication as directed.  BELOW ARE SYMPTOMS THAT SHOULD BE REPORTED IMMEDIATELY: *FEVER GREATER THAN 100.4 F (38 C) OR HIGHER *CHILLS OR SWEATING *NAUSEA AND VOMITING THAT IS NOT CONTROLLED WITH YOUR NAUSEA MEDICATION *UNUSUAL SHORTNESS OF BREATH *UNUSUAL BRUISING OR BLEEDING *URINARY PROBLEMS (pain or burning when urinating, or frequent urination) *BOWEL PROBLEMS (unusual diarrhea, constipation, pain near the anus) TENDERNESS IN MOUTH AND THROAT WITH OR WITHOUT PRESENCE OF ULCERS (sore throat, sores in mouth, or a toothache) UNUSUAL RASH, SWELLING OR PAIN  UNUSUAL VAGINAL DISCHARGE OR ITCHING   Items with * indicate a potential emergency and should be followed up as soon as possible or go to the Emergency Department if any problems should occur.  Please show the CHEMOTHERAPY ALERT CARD or IMMUNOTHERAPY ALERT CARD at check-in to the  Emergency Department and triage nurse.  Should you have questions after your visit or need to cancel or reschedule your appointment, please contact [facility]  Dept: [phone]  and follow the prompts.  Office hours are 8:00 a.m. to 4:30 p.m. Monday - Friday. Please note that voicemails left after 4:00 p.m. may not be returned until the following business day.  We are closed weekends and major holidays. You have access to a nurse at all times for urgent questions. Please call the main number to the clinic Dept: [phone] and follow the prompts.   For any non-urgent questions, you may also contact your provider using MyChart. We now offer e-Visits for anyone 18 and older to request care online for non-urgent symptoms. For details visit [email or web address].   Also download the MyChart app! Go to the app store, search "MyChart", open the app, select , and log in with your MyChart username and password.  Due to Covid, a mask is required upon entering the hospital/clinic. If you do not have a mask, one will be given to you upon arrival. For doctor visits, patients may have 1 support person aged 18 or older with them. For treatment visits, patients cannot have anyone with them due to current Covid guidelines and our immunocompromised population.   Trastuzumab injection for infusion What is this medication? TRASTUZUMAB (tras TOO zoo mab) is a monoclonal antibody. It is used to treatbreast cancer and stomach cancer. This medicine may be used for other purposes; ask your health care provider orpharmacist if you have questions. COMMON BRAND NAME(S): Herceptin, Herzuma,   KANJINTI, Ogivri, Ontruzant, Trazimera What should I tell my care team before I take this medication? They need to know if you have any of these conditions: heart disease heart failure lung or breathing disease, like asthma an unusual or allergic reaction to trastuzumab, benzyl alcohol, or  other medications, foods, dyes, or preservatives pregnant or trying to get pregnant breast-feeding How should I use this medication? This drug is given as an infusion into a vein. It is administered in a hospitalor clinic by a specially trained health care professional. Talk to your pediatrician regarding the use of this medicine in children. Thismedicine is not approved for use in children. Overdosage: If you think you have taken too much of this medicine contact apoison control center or emergency room at once. NOTE: This medicine is only for you. Do not share this medicine with others. What if I miss a dose? It is important not to miss a dose. Call your doctor or health careprofessional if you are unable to keep an appointment. What may interact with this medication? This medicine may interact with the following medications: certain types of chemotherapy, such as daunorubicin, doxorubicin, epirubicin, and idarubicin This list may not describe all possible interactions. Give your health care provider a list of all the medicines, herbs, non-prescription drugs, or dietary supplements you use. Also tell them if you smoke, drink alcohol, or use illegaldrugs. Some items may interact with your medicine. What should I watch for while using this medication? Visit your doctor for checks on your progress. Report any side effects. Continue your course of treatment even though you feel ill unless your doctortells you to stop. Call your doctor or health care professional for advice if you get a fever, chills or sore throat, or other symptoms of a cold or flu. Do not treatyourself. Try to avoid being around people who are sick. You may experience fever, chills and shaking during your first infusion. These effects are usually mild and can be treated with other medicines. Report any side effects during the infusion to your health care professional. Fever andchills usually do not happen with later infusions. Do  not become pregnant while taking this medicine or for 7 months after stopping it. Women should inform their doctor if they wish to become pregnant or think they might be pregnant. Women of child-bearing potential will need to have a negative pregnancy test before starting this medicine. There is a potential for serious side effects to an unborn child. Talk to your health care professional or pharmacist for more information. Do not breast-feed an infantwhile taking this medicine or for 7 months after stopping it. Women must use effective birth control with this medicine. What side effects may I notice from receiving this medication? Side effects that you should report to your doctor or health care professionalas soon as possible: allergic reactions like skin rash, itching or hives, swelling of the face, lips, or tongue chest pain or palpitations cough dizziness feeling faint or lightheaded, falls fever general ill feeling or flu-like symptoms signs of worsening heart failure like breathing problems; swelling in your legs and feet unusually weak or tired Side effects that usually do not require medical attention (report to yourdoctor or health care professional if they continue or are bothersome): bone pain changes in taste diarrhea joint pain nausea/vomiting weight loss This list may not describe all possible side effects. Call your doctor for medical advice about side effects. You may report side effects to FDA at1-800-FDA-1088. Where should I keep my   medication? This drug is given in a hospital or clinic and will not be stored at home. NOTE: This sheet is a summary. It may not cover all possible information. If you have questions about this medicine, talk to your doctor, pharmacist, orhealth care provider.  2022 Elsevier/Gold Standard (2016-06-21 14:37:52)   

## 2020-12-22 NOTE — Assessment & Plan Note (Signed)
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 Anti Her 2 therapy with Lapatinibstarted 09/17/2017-05/14/2019: Stopped for progression 3.I discontinuedtamoxifen and started her on letrozole 2.5 mg daily.05/14/2019 stopped for progression 4.Stereotactic radiosurgery 12/19/2017 to the new right parietal lobe metastases. 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 Previously received blood transfusion. Today's hemoglobin is  9 2.Chronic dry cough: I renewed a prescription for cough syrup 3.Painful erythema of her hands: I discontinued Xeloda and Tukysa. 4.Blisters on the lips  Rash on the dorsum of the hands: Continuing to use moisturizers.  Tucatinib has been discontinued.  We prescribed her Medrol Dosepak and a prescription for betamethasone ointment.  Return to clinic in 3 weeks for Herceptin 

## 2021-01-08 ENCOUNTER — Other Ambulatory Visit: Payer: Self-pay | Admitting: *Deleted

## 2021-01-08 DIAGNOSIS — C7931 Secondary malignant neoplasm of brain: Secondary | ICD-10-CM

## 2021-01-11 NOTE — Progress Notes (Signed)
Patient Care Team: Patient, [name] as PCP - General (General Practice) [name], MD as Referring Physician (Emergency Medicine)  DIAGNOSIS:    ICD-10-CM   1. Malignant neoplasm of upper-inner quadrant of left breast in female, estrogen receptor negative ([place])  C50.212    Z17.1       SUMMARY OF ONCOLOGIC HISTORY: Oncology History  Breast cancer of upper-inner quadrant of left female breast ([place])  06/08/2012 Initial Diagnosis   invasive ductal carcinoma that was ER positive PR positive HER-2/neu positive measuring 3.1 cm by MRI criteria. Ki-67 was 70% HER-2 was amplified with a ratio 2.91    07/12/2012 - 07/17/2013 Neo-Adjuvant Chemotherapy   TCH 6 followed by Herceptin maintenance    12/11/2012 Surgery   Left breast lumpectomy: 1.8 cm tumor 1 positive sentinel node, axillary lymph node dissection 02/08/2013 showed 0/13 lymph nodes    03/25/2013 - 05/06/2013 Radiation Therapy   Adjuvant radiation therapy    06/05/2013 - 07/20/2017 Anti-estrogen oral therapy   Tamoxifen 20 mg daily    07/27/2017 Relapse/Recurrence   MRI Brain: 3.4 x 2.9 x 2.9 cm RIGHT frontal lobe mass with imaging characteristics of solitary metastasis. Extensive vasogenic edema resulting in 9 mm RIGHT to LEFT midline shift. Equivocal very early LEFT ventricle entrapment.    08/04/2017 Surgery   Rt frontal brain resection: Poorly differentiated tumor IHC suggests breast primary ER and PR Positive    08/25/2017 - 09/04/2017 Radiation Therapy   Stereotactic radiation    09/18/2017 -  Anti-estrogen oral therapy   Lapatinib with letrozole    12/18/2017 - 12/19/2017 Radiation Therapy   New right parietal lobe metastases status post [facility]    08/27/2018 - 08/27/2020 Radiation Therapy   SRS to new brain metastases   05/09/2019 Relapse/Recurrence   Interval increase in size of the enhancing nodular left internal mammary soft tissue 2.8 cm.  Redemonstrated enlarged supraclavicular, lower  cervical and lower posterior cervical nodes unchanged.  Interval increase in the bony erosion of the posterior and lateral left third rib, increasing soft tissue lesion eroding the left sternal body 3.1 cm was 2.5 cm.  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 with above-mentioned history of metastatic breast cancer with brain metastasis s/p resection who is currently on treatment with Xeloda and Herceptin. She reports to the clinic today for cycle 11.  The rash on her hands and feet is continuing to improve.  This is related to both Xeloda and Tucatinib.  Both of these medicines have been discontinued.  She will currently stay on Herceptin alone.  Significant improvement in the rash on the dorsum of her hands as well as the neck as well as her feet.  The lips have also healed nearly completely.  She is off all antibiotics as well.  ALLERGIES:  is allergic to aspirin, protonix [pantoprazole], and iodinated diagnostic agents.  MEDICATIONS:  Current Outpatient Medications  Medication Sig Dispense Refill   albuterol (VENTOLIN HFA) 108 (90 Base) MCG/ACT inhaler Inhale 2 puffs into the lungs  every 6 (six) hours as needed. 8 g 5   betamethasone valerate ointment (VALISONE) 0.1 % Apply 1 application topically 2 (two) times daily. 30 g 0   budesonide-formoterol (SYMBICORT) 160-4.5 MCG/ACT inhaler Inhale 2 puffs into the lungs 2 (two) times daily. 3 each 3   chlorpheniramine-HYDROcodone (TUSSIONEX) 10-8 MG/5ML SUER Take 5 mLs by mouth at bedtime. 115 mL 0    ELDERBERRY PO Take by mouth daily. Per patient, takes one daily     levalbuterol (XOPENEX) 0.63 MG/3ML nebulizer solution Take 3 mLs (0.63 mg total) by nebulization every 4 (four) hours as needed for wheezing or shortness of breath. 540 mL 6   lidocaine-prilocaine (EMLA) cream APPLY EXTERNALLY TO THE AFFECTED AREA 1 TIME 30 g 3   naproxen sodium (ALEVE) 220 MG tablet Take 440 mg by mouth 2 (two) times daily as needed (headache).      omeprazole (PRILOSEC) 40 MG capsule TAKE 1 CAPSULE(40 MG) BY MOUTH DAILY 30 capsule 9   voriconazole (VFEND) 200 MG tablet Take 1 tablet (200 mg total) by mouth 2 (two) times daily. 60 tablet 11   No current facility-administered medications for this visit.    PHYSICAL EXAMINATION: ECOG PERFORMANCE STATUS: 1 - Symptomatic but completely ambulatory  Vitals:   01/12/21 0940  BP: (!) 101/54  Pulse: 69  Resp: 17  Temp: 97.6 F (36.4 C)  SpO2: 99%   Filed Weights   01/12/21 0940  Weight: 97 lb 9.6 oz (44.3 kg)    LABORATORY DATA:  I have reviewed the data as listed CMP Latest Ref Rng & Units 11/30/2020 11/09/2020 10/20/2020  Glucose 70 - 99 mg/dL 84 156(H) 107(H)  BUN 6 - 20 mg/dL $Remove'14 18 14  'pNgysAp$ Creatinine 0.44 - 1.00 mg/dL 0.76 0.86 0.98  Sodium 135 - 145 mmol/L 140 137 139  Potassium 3.5 - 5.1 mmol/L 4.3 3.8 4.0  Chloride 98 - 111 mmol/L 108 106 106  CO2 22 - 32 mmol/L $RemoveB'25 23 24  'GzCvMKdm$ Calcium 8.9 - 10.3 mg/dL 8.9 9.1 9.3  Total Protein 6.5 - 8.1 g/dL 6.9 7.1 7.2  Total Bilirubin 0.3 - 1.2 mg/dL <0.2(L) <0.2(L) 0.3  Alkaline Phos 38 - 126 U/L 154(H) 185(H) 219(H)  AST 15 - 41 U/L 14(L) 20 25  ALT 0 - 44 U/L $Remo'8 9 12    'UzfKS$ Lab Results  Component Value Date   WBC 9.8 01/12/2021   HGB 9.2 (L) 01/12/2021   HCT 29.4 (L) 01/12/2021   MCV 86.5 01/12/2021   PLT 324 01/12/2021   NEUTROABS 6.5 01/12/2021    ASSESSMENT & PLAN:  Breast cancer of upper-inner quadrant of left female breast ([place]) 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  [place] brain: 08/25/2017-09/04/2017 2. Anti Her 2 therapy with Lapatinib started 09/17/2017-05/14/2019: Stopped for progression 3.  I discontinued tamoxifen and started her on letrozole 2.5 mg daily.  05/14/2019 stopped for progression 4.  Stereotactic radiosurgery 12/19/2017 to the new right parietal lobe metastases. 5.  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  AFB positive, being treated with voriconazole MRI of the brain 08/13/20: Mixed treatment response with Dec in 3 lesions and multiple new lesions: Status post SBRT ------------------------------------------------------------------------------------------------------------- Current treatment: Xeloda, Herceptin, Tucatinib started 06/11/2020 (Xeloda discontinued 10/20/2020 for erythematous hands, Tucatinib discontinued 12/15/2020)   Adverse effects: 1. Severe anemia: Asymptomatic we will watch.  Previously received blood transfusion.  Today's hemoglobin is 9 2. Chronic dry cough: I renewed a prescription for cough syrup 3.  Painful  erythema of her hands: I discontinued Xeloda and Tukysa. 4.  Blisters on the lips: Resolved   Rash on the dorsum of the hands: Continuing to use moisturizers.  Tucatinib has been discontinued.  It has also resolved  Pustular lesions on the dorsum of the hand: Resolved  Return to clinic every 3 weeks for Herceptin. She plans to go to [place] [employer] [place] for vacation starting tomorrow.  She is looking forward to that after a long time she is able to get away. We will await the results of the brain MRI in August and after that we will make a decision regarding Tucatinib.   No orders of the defined types were placed in this encounter.  The patient has a good understanding of the overall plan. she agrees with it. she will call with any problems that may develop before the next visit here.  Total time spent: 30 mins including face to face time and time spent for planning, charting and coordination of care  Rulon Eisenmenger, MD, MPH 01/12/2021  I, Thana Ates, am acting as scribe for Dr. Nicholas Lose.  I have reviewed the above documentation for accuracy and completeness, and I agree with the above.

## 2021-01-12 ENCOUNTER — Inpatient Hospital Stay: Payer: No Typology Code available for payment source

## 2021-01-12 ENCOUNTER — Inpatient Hospital Stay (HOSPITAL_BASED_OUTPATIENT_CLINIC_OR_DEPARTMENT_OTHER): Payer: No Typology Code available for payment source | Admitting: Hematology and Oncology

## 2021-01-12 ENCOUNTER — Other Ambulatory Visit: Payer: Self-pay

## 2021-01-12 ENCOUNTER — Inpatient Hospital Stay: Payer: No Typology Code available for payment source | Attending: Hematology and Oncology

## 2021-01-12 DIAGNOSIS — C787 Secondary malignant neoplasm of liver and intrahepatic bile duct: Secondary | ICD-10-CM | POA: Insufficient documentation

## 2021-01-12 DIAGNOSIS — C7951 Secondary malignant neoplasm of bone: Secondary | ICD-10-CM | POA: Insufficient documentation

## 2021-01-12 DIAGNOSIS — Z171 Estrogen receptor negative status [ER-]: Secondary | ICD-10-CM

## 2021-01-12 DIAGNOSIS — Z452 Encounter for adjustment and management of vascular access device: Secondary | ICD-10-CM | POA: Insufficient documentation

## 2021-01-12 DIAGNOSIS — C7931 Secondary malignant neoplasm of brain: Secondary | ICD-10-CM | POA: Diagnosis not present

## 2021-01-12 DIAGNOSIS — C50912 Malignant neoplasm of unspecified site of left female breast: Secondary | ICD-10-CM

## 2021-01-12 DIAGNOSIS — Z170 Estrogen receptor positive status [ER+]: Secondary | ICD-10-CM | POA: Insufficient documentation

## 2021-01-12 DIAGNOSIS — C50212 Malignant neoplasm of upper-inner quadrant of left female breast: Secondary | ICD-10-CM

## 2021-01-12 DIAGNOSIS — Z95828 Presence of other vascular implants and grafts: Secondary | ICD-10-CM

## 2021-01-12 DIAGNOSIS — Z5112 Encounter for antineoplastic immunotherapy: Secondary | ICD-10-CM | POA: Diagnosis not present

## 2021-01-12 LAB — CMP (CANCER CENTER ONLY)
ALT: 10 U/L (ref 0–44)
AST: 18 U/L (ref 15–41)
Albumin: 3 g/dL — ABNORMAL LOW (ref 3.5–5.0)
Alkaline Phosphatase: 143 U/L — ABNORMAL HIGH (ref 38–126)
Anion gap: 9 (ref 5–15)
BUN: 15 mg/dL (ref 6–20)
CO2: 25 mmol/L (ref 22–32)
Calcium: 9 mg/dL (ref 8.9–10.3)
Chloride: 106 mmol/L (ref 98–111)
Creatinine: 0.81 mg/dL (ref 0.44–1.00)
GFR, Estimated: >60 mL/min (ref >60)
Glucose, Bld: 75 mg/dL (ref 70–99)
Potassium: 4 mmol/L (ref 3.5–5.1)
Sodium: 140 mmol/L (ref 135–145)
Total Bilirubin: <0.2 mg/dL — ABNORMAL LOW (ref 0.3–1.2)
Total Protein: 7.6 g/dL (ref 6.5–8.1)

## 2021-01-12 LAB — CBC WITH DIFFERENTIAL (CANCER CENTER ONLY)
Abs Immature Granulocytes: 0.02 10*3/uL (ref 0.00–0.07)
Basophils Absolute: 0 10*3/uL (ref 0.0–0.1)
Basophils Relative: 0 %
Eosinophils Absolute: 0.6 10*3/uL — ABNORMAL HIGH (ref 0.0–0.5)
Eosinophils Relative: 6 %
HCT: 29.4 % — ABNORMAL LOW (ref 36.0–46.0)
Hemoglobin: 9.2 g/dL — ABNORMAL LOW (ref 12.0–15.0)
Immature Granulocytes: 0 %
Lymphocytes Relative: 18 %
Lymphs Abs: 1.8 10*3/uL (ref 0.7–4.0)
MCH: 27.1 pg (ref 26.0–34.0)
MCHC: 31.3 g/dL (ref 30.0–36.0)
MCV: 86.5 fL (ref 80.0–100.0)
Monocytes Absolute: 0.9 10*3/uL (ref 0.1–1.0)
Monocytes Relative: 9 %
Neutro Abs: 6.5 10*3/uL (ref 1.7–7.7)
Neutrophils Relative %: 67 %
Platelet Count: 324 10*3/uL (ref 150–400)
RBC: 3.4 MIL/uL — ABNORMAL LOW (ref 3.87–5.11)
RDW: 18.6 % — ABNORMAL HIGH (ref 11.5–15.5)
WBC Count: 9.8 10*3/uL (ref 4.0–10.5)
nRBC: 0 % (ref 0.0–0.2)

## 2021-01-12 MED ORDER — ACETAMINOPHEN 325 MG PO TABS
ORAL_TABLET | ORAL | Status: AC
  Filled 2021-01-12: qty 2

## 2021-01-12 MED ORDER — ACETAMINOPHEN 325 MG PO TABS
650.0000 mg | ORAL_TABLET | Freq: Once | ORAL | Status: AC
  Administered 2021-01-12: 650 mg via ORAL

## 2021-01-12 MED ORDER — ALTEPLASE 2 MG IJ SOLR
2.0000 mg | Freq: Once | INTRAMUSCULAR | Status: AC | PRN
  Administered 2021-01-12: 2 mg
  Filled 2021-01-12: qty 2

## 2021-01-12 MED ORDER — SODIUM CHLORIDE 0.9% FLUSH
10.0000 mL | INTRAVENOUS | Status: DC | PRN
  Administered 2021-01-12: 10 mL
  Filled 2021-01-12: qty 10

## 2021-01-12 MED ORDER — SODIUM CHLORIDE 0.9 % IV SOLN
Freq: Once | INTRAVENOUS | Status: AC
Start: 2021-01-12 — End: 2021-01-12
  Filled 2021-01-12: qty 250

## 2021-01-12 MED ORDER — TRASTUZUMAB-DKST CHEMO 150 MG IV SOLR
6.0000 mg/kg | Freq: Once | INTRAVENOUS | Status: AC
  Administered 2021-01-12: 273 mg via INTRAVENOUS
  Filled 2021-01-12: qty 13

## 2021-01-12 MED ORDER — ALTEPLASE 2 MG IJ SOLR
INTRAMUSCULAR | Status: AC
  Filled 2021-01-12: qty 2

## 2021-01-12 MED ORDER — DIPHENHYDRAMINE HCL 25 MG PO CAPS
25.0000 mg | ORAL_CAPSULE | Freq: Once | ORAL | Status: AC
  Administered 2021-01-12: 25 mg via ORAL

## 2021-01-12 MED ORDER — HEPARIN SOD (PORK) LOCK FLUSH 100 UNIT/ML IV SOLN
500.0000 [IU] | Freq: Once | INTRAVENOUS | Status: AC | PRN
  Administered 2021-01-12: 500 [IU]
  Filled 2021-01-12: qty 5

## 2021-01-12 MED ORDER — DIPHENHYDRAMINE HCL 25 MG PO CAPS
ORAL_CAPSULE | ORAL | Status: AC
  Filled 2021-01-12: qty 1

## 2021-01-12 NOTE — Assessment & Plan Note (Signed)
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 Anti Her 2 therapy with Lapatinibstarted 09/17/2017-05/14/2019: Stopped for progression 3.I discontinuedtamoxifen and started her on letrozole 2.5 mg daily.05/14/2019 stopped for progression 4.Stereotactic radiosurgery 12/19/2017 to the new right parietal lobe metastases. 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 Previously received blood transfusion. Today's hemoglobin is  9 2.Chronic dry cough: I renewed a prescription for cough syrup 3.Painful erythema of her hands: I discontinued Xeloda and Tukysa. 4.Blisters on the lips  Rash on the dorsum of the hands: Continuing to use moisturizers.  Tucatinib has been discontinued.  Pustular lesions on the dorsum of the hand: Treated with Augmentin for 2 weeks  Return to clinic every 3 weeks for Herceptin.

## 2021-01-22 ENCOUNTER — Other Ambulatory Visit (HOSPITAL_COMMUNITY): Payer: No Typology Code available for payment source

## 2021-01-27 ENCOUNTER — Other Ambulatory Visit: Payer: Self-pay

## 2021-01-27 ENCOUNTER — Other Ambulatory Visit: Payer: Self-pay | Admitting: Radiation Therapy

## 2021-01-27 ENCOUNTER — Ambulatory Visit (HOSPITAL_COMMUNITY)
Admission: RE | Admit: 2021-01-27 | Discharge: 2021-01-27 | Disposition: A | Discharge status: Home / Self Care | Payer: No Typology Code available for payment source | Source: Ambulatory Visit | Attending: Hematology and Oncology | Admitting: Hematology and Oncology

## 2021-01-27 DIAGNOSIS — F17210 Nicotine dependence, cigarettes, uncomplicated: Secondary | ICD-10-CM | POA: Diagnosis not present

## 2021-01-27 DIAGNOSIS — R000 Tachycardia, unspecified: Secondary | ICD-10-CM | POA: Diagnosis not present

## 2021-01-27 DIAGNOSIS — C50212 Malignant neoplasm of upper-inner quadrant of left female breast: Secondary | ICD-10-CM | POA: Insufficient documentation

## 2021-01-27 DIAGNOSIS — Z0189 Encounter for other specified special examinations: Secondary | ICD-10-CM

## 2021-01-27 DIAGNOSIS — J449 Chronic obstructive pulmonary disease, unspecified: Secondary | ICD-10-CM | POA: Diagnosis not present

## 2021-01-27 DIAGNOSIS — C7931 Secondary malignant neoplasm of brain: Secondary | ICD-10-CM

## 2021-01-27 DIAGNOSIS — C50912 Malignant neoplasm of unspecified site of left female breast: Secondary | ICD-10-CM | POA: Diagnosis not present

## 2021-01-27 DIAGNOSIS — Z171 Estrogen receptor negative status [ER-]: Secondary | ICD-10-CM | POA: Insufficient documentation

## 2021-01-27 DIAGNOSIS — Z01818 Encounter for other preprocedural examination: Secondary | ICD-10-CM | POA: Diagnosis not present

## 2021-01-27 DIAGNOSIS — R9431 Abnormal electrocardiogram [ECG] [EKG]: Secondary | ICD-10-CM | POA: Diagnosis not present

## 2021-01-27 LAB — ECHOCARDIOGRAM COMPLETE
Area-P 1/2: 4.68 cm2
Calc EF: 53.8 %
S' Lateral: 2.9 cm
Single Plane A2C EF: 58.4 %
Single Plane A4C EF: 50.6 %

## 2021-01-27 NOTE — Progress Notes (Signed)
Orders placed for Port access and De-access the day of brain MRI at [place].  (Use for administration of IV contrast)   Mont Dutton R.T.(R)(T) Radiation Special Procedures Navigator

## 2021-01-27 NOTE — Addendum Note (Signed)
Addended by: Pincus Large on: 01/27/2021 04:11 PM   Modules accepted: Orders

## 2021-01-27 NOTE — Progress Notes (Signed)
  Echocardiogram 2D Echocardiogram has been performed.  Melissa [name] 01/27/2021, 10:15 AM

## 2021-02-02 ENCOUNTER — Inpatient Hospital Stay: Payer: No Typology Code available for payment source

## 2021-02-02 ENCOUNTER — Telehealth: Payer: Self-pay | Admitting: *Deleted

## 2021-02-02 ENCOUNTER — Other Ambulatory Visit: Payer: Self-pay | Admitting: Oncology

## 2021-02-02 DIAGNOSIS — C50912 Malignant neoplasm of unspecified site of left female breast: Secondary | ICD-10-CM

## 2021-02-02 DIAGNOSIS — C50212 Malignant neoplasm of upper-inner quadrant of left female breast: Secondary | ICD-10-CM

## 2021-02-02 MED ORDER — HYDROCOD POLST-CPM POLST ER 10-8 MG/5ML PO SUER: 5.0000 mL | Freq: Every day | ORAL | 0 refills | Status: DC

## 2021-02-02 NOTE — Telephone Encounter (Signed)
Melissa [name] left a message stating she had to cancel treatment today due to back spasms. Is asking to be rescheduled to later this week. Message to scheduler.  Is asking for a refill of Tussionex.

## 2021-02-08 ENCOUNTER — Ambulatory Visit: Payer: PRIVATE HEALTH INSURANCE | Admitting: Infectious Disease

## 2021-02-08 ENCOUNTER — Other Ambulatory Visit: Payer: Self-pay

## 2021-02-08 ENCOUNTER — Inpatient Hospital Stay: Payer: Medicare Other | Attending: Hematology and Oncology

## 2021-02-08 ENCOUNTER — Encounter: Payer: Self-pay | Admitting: Hematology and Oncology

## 2021-02-08 VITALS — BP 107/50 | HR 72 | Temp 98.3°F | Resp 18 | Ht 62.0 in | Wt 97.2 lb

## 2021-02-08 DIAGNOSIS — Z5112 Encounter for antineoplastic immunotherapy: Secondary | ICD-10-CM | POA: Diagnosis present

## 2021-02-08 DIAGNOSIS — Z95828 Presence of other vascular implants and grafts: Secondary | ICD-10-CM

## 2021-02-08 DIAGNOSIS — C50212 Malignant neoplasm of upper-inner quadrant of left female breast: Secondary | ICD-10-CM | POA: Diagnosis present

## 2021-02-08 DIAGNOSIS — Z171 Estrogen receptor negative status [ER-]: Secondary | ICD-10-CM

## 2021-02-08 DIAGNOSIS — C7951 Secondary malignant neoplasm of bone: Secondary | ICD-10-CM | POA: Diagnosis not present

## 2021-02-08 DIAGNOSIS — Z170 Estrogen receptor positive status [ER+]: Secondary | ICD-10-CM | POA: Insufficient documentation

## 2021-02-08 DIAGNOSIS — C7931 Secondary malignant neoplasm of brain: Secondary | ICD-10-CM | POA: Insufficient documentation

## 2021-02-08 DIAGNOSIS — C787 Secondary malignant neoplasm of liver and intrahepatic bile duct: Secondary | ICD-10-CM | POA: Insufficient documentation

## 2021-02-08 DIAGNOSIS — Z452 Encounter for adjustment and management of vascular access device: Secondary | ICD-10-CM | POA: Diagnosis not present

## 2021-02-08 MED ORDER — DIPHENHYDRAMINE HCL 25 MG PO CAPS
25.0000 mg | ORAL_CAPSULE | Freq: Once | ORAL | Status: AC
  Administered 2021-02-08: 25 mg via ORAL

## 2021-02-08 MED ORDER — DIPHENHYDRAMINE HCL 25 MG PO CAPS
ORAL_CAPSULE | ORAL | Status: AC
  Filled 2021-02-08: qty 1

## 2021-02-08 MED ORDER — SODIUM CHLORIDE 0.9 % IV SOLN
Freq: Once | INTRAVENOUS | Status: AC
  Filled 2021-02-08: qty 250

## 2021-02-08 MED ORDER — ALTEPLASE 2 MG IJ SOLR
2.0000 mg | Freq: Once | INTRAMUSCULAR | Status: AC | PRN
  Administered 2021-02-08: 2 mg
  Filled 2021-02-08: qty 2

## 2021-02-08 MED ORDER — ACETAMINOPHEN 325 MG PO TABS
650.0000 mg | ORAL_TABLET | Freq: Once | ORAL | Status: AC
  Administered 2021-02-08: 650 mg via ORAL

## 2021-02-08 MED ORDER — ACETAMINOPHEN 325 MG PO TABS
ORAL_TABLET | ORAL | Status: AC
  Filled 2021-02-08: qty 2

## 2021-02-08 MED ORDER — SODIUM CHLORIDE 0.9% FLUSH
10.0000 mL | INTRAVENOUS | Status: DC | PRN
  Administered 2021-02-08: 10 mL
  Filled 2021-02-08: qty 10

## 2021-02-08 MED ORDER — ALTEPLASE 2 MG IJ SOLR
INTRAMUSCULAR | Status: AC
  Filled 2021-02-08: qty 2

## 2021-02-08 MED ORDER — TRASTUZUMAB-DKST CHEMO 150 MG IV SOLR
6.0000 mg/kg | Freq: Once | INTRAVENOUS | Status: AC
  Administered 2021-02-08: 273 mg via INTRAVENOUS
  Filled 2021-02-08: qty 13

## 2021-02-08 MED ORDER — HEPARIN SOD (PORK) LOCK FLUSH 100 UNIT/ML IV SOLN
500.0000 [IU] | Freq: Once | INTRAVENOUS | Status: AC | PRN
  Administered 2021-02-08: 500 [IU]
  Filled 2021-02-08: qty 5

## 2021-02-08 NOTE — Patient Instructions (Signed)
[facility]  Discharge Instructions: Thank you for choosing [facility] to provide your oncology and hematology care.   If you have a lab appointment with the [facility], please go directly to the [facility] and check in at the registration area.   Wear comfortable clothing and clothing appropriate for easy access to any Portacath or PICC line.   We strive to give you quality time with your provider. You may need to reschedule your appointment if you arrive late (15 or more minutes).  Arriving late affects you and other patients whose appointments are after yours.  Also, if you miss three or more appointments without notifying the office, you may be dismissed from the clinic at the provider's discretion.      For prescription refill requests, have your pharmacy contact our office and allow 72 hours for refills to be completed.    Today you received the following chemotherapy and/or immunotherapy agents [place]      To help prevent nausea and vomiting after your treatment, we encourage you to take your nausea medication as directed.  BELOW ARE SYMPTOMS THAT SHOULD BE REPORTED IMMEDIATELY: *FEVER GREATER THAN 100.4 F (38 C) OR HIGHER *CHILLS OR SWEATING *NAUSEA AND VOMITING THAT IS NOT CONTROLLED WITH YOUR NAUSEA MEDICATION *UNUSUAL SHORTNESS OF BREATH *UNUSUAL BRUISING OR BLEEDING *URINARY PROBLEMS (pain or burning when urinating, or frequent urination) *BOWEL PROBLEMS (unusual diarrhea, constipation, pain near the anus) TENDERNESS IN MOUTH AND THROAT WITH OR WITHOUT PRESENCE OF ULCERS (sore throat, sores in mouth, or a toothache) UNUSUAL RASH, SWELLING OR PAIN  UNUSUAL VAGINAL DISCHARGE OR ITCHING   Items with * indicate a potential emergency and should be followed up as soon as possible or go to the Emergency Department if any problems should occur.  Please show the CHEMOTHERAPY ALERT CARD or IMMUNOTHERAPY ALERT CARD at check-in to the  Emergency Department and triage nurse.  Should you have questions after your visit or need to cancel or reschedule your appointment, please contact [facility]  Dept: [phone]  and follow the prompts.  Office hours are 8:00 a.m. to 4:30 p.m. Monday - Friday. Please note that voicemails left after 4:00 p.m. may not be returned until the following business day.  We are closed weekends and major holidays. You have access to a nurse at all times for urgent questions. Please call the main number to the clinic Dept: [phone] and follow the prompts.   For any non-urgent questions, you may also contact your provider using MyChart. We now offer e-Visits for anyone 70 and older to request care online for non-urgent symptoms. For details visit [email or web address].   Also download the MyChart app! Go to the app store, search "MyChart", open the app, select [facility], and log in with your MyChart username and password.  Due to Covid, a mask is required upon entering the hospital/clinic. If you do not have a mask, one will be given to you upon arrival. For doctor visits, patients may have 1 support person aged 55 or older with them. For treatment visits, patients cannot have anyone with them due to current Covid guidelines and our immunocompromised population.  This list may not describe all possible side effects. Call your doctor for medical advice about side effects. You may report side effects to FDA at1-800-FDA-1088. Where should I keep my medication? This drug is given in a hospital or clinic and will not be stored at home. NOTE: This  sheet is a summary. It may not cover all possible information. If you have questions about this medicine, talk to your doctor, pharmacist, orhealth care provider.  2022 Elsevier/Gold Standard (2016-06-21 14:37:52)

## 2021-02-09 ENCOUNTER — Telehealth: Payer: Self-pay | Admitting: Hematology and Oncology

## 2021-02-09 ENCOUNTER — Other Ambulatory Visit: Payer: Self-pay

## 2021-02-09 DIAGNOSIS — C50212 Malignant neoplasm of upper-inner quadrant of left female breast: Secondary | ICD-10-CM

## 2021-02-09 DIAGNOSIS — Z95828 Presence of other vascular implants and grafts: Secondary | ICD-10-CM

## 2021-02-09 DIAGNOSIS — Z171 Estrogen receptor negative status [ER-]: Secondary | ICD-10-CM

## 2021-02-09 NOTE — Progress Notes (Signed)
Patient called to report multiple incidents of difficult blood return with port a cath, having to use cath flo.   Patient was educated regarding a dye study from infusion staff, and requesting to proceed with this.   MD in agreement.  Orders placed for dye study.  Patient aware.  No further needs at this time.

## 2021-02-09 NOTE — Telephone Encounter (Signed)
R/s appts per 8/1 sch msg. Pt aware.  

## 2021-02-15 ENCOUNTER — Other Ambulatory Visit: Payer: Self-pay | Admitting: *Deleted

## 2021-02-15 ENCOUNTER — Encounter: Payer: Self-pay | Admitting: Hematology and Oncology

## 2021-02-15 DIAGNOSIS — C787 Secondary malignant neoplasm of liver and intrahepatic bile duct: Secondary | ICD-10-CM

## 2021-02-15 DIAGNOSIS — C50212 Malignant neoplasm of upper-inner quadrant of left female breast: Secondary | ICD-10-CM

## 2021-02-15 DIAGNOSIS — C50912 Malignant neoplasm of unspecified site of left female breast: Secondary | ICD-10-CM

## 2021-02-15 DIAGNOSIS — Z171 Estrogen receptor negative status [ER-]: Secondary | ICD-10-CM

## 2021-02-15 DIAGNOSIS — C7931 Secondary malignant neoplasm of brain: Secondary | ICD-10-CM

## 2021-02-16 ENCOUNTER — Other Ambulatory Visit: Payer: Self-pay | Admitting: Hematology and Oncology

## 2021-02-16 DIAGNOSIS — Z1231 Encounter for screening mammogram for malignant neoplasm of breast: Secondary | ICD-10-CM

## 2021-02-17 ENCOUNTER — Other Ambulatory Visit: Payer: Self-pay | Admitting: Adult Health

## 2021-02-19 ENCOUNTER — Ambulatory Visit (HOSPITAL_COMMUNITY)
Admission: RE | Admit: 2021-02-19 | Discharge: 2021-02-19 | Disposition: A | Discharge status: Home / Self Care | Payer: Medicare Other | Source: Ambulatory Visit | Attending: Adult Health | Admitting: Adult Health

## 2021-02-19 ENCOUNTER — Other Ambulatory Visit: Payer: Self-pay

## 2021-02-19 DIAGNOSIS — C50212 Malignant neoplasm of upper-inner quadrant of left female breast: Secondary | ICD-10-CM | POA: Insufficient documentation

## 2021-02-19 DIAGNOSIS — Z452 Encounter for adjustment and management of vascular access device: Secondary | ICD-10-CM | POA: Diagnosis present

## 2021-02-19 DIAGNOSIS — Z171 Estrogen receptor negative status [ER-]: Secondary | ICD-10-CM | POA: Insufficient documentation

## 2021-02-19 DIAGNOSIS — Z95828 Presence of other vascular implants and grafts: Secondary | ICD-10-CM

## 2021-02-19 HISTORY — PX: IR CV LINE INJECTION: IMG2294

## 2021-02-19 MED ORDER — HEPARIN SOD (PORK) LOCK FLUSH 100 UNIT/ML IV SOLN
INTRAVENOUS | Status: AC
  Administered 2021-02-19: 500 [IU]
  Filled 2021-02-19: qty 5

## 2021-02-19 MED ORDER — HEPARIN SOD (PORK) LOCK FLUSH 100 UNIT/ML IV SOLN
INTRAVENOUS | Status: AC | PRN
  Administered 2021-02-19: 500 [IU] via INTRAVENOUS

## 2021-02-19 MED ORDER — IODIXANOL 320 MG/ML IV SOLN
10.0000 mL | Freq: Once | INTRAVENOUS | Status: AC | PRN
  Administered 2021-02-19: 5 mL

## 2021-02-22 ENCOUNTER — Ambulatory Visit
Admission: RE | Admit: 2021-02-22 | Discharge: 2021-02-22 | Disposition: A | Discharge status: Home / Self Care | Payer: Medicare Other | Source: Ambulatory Visit | Attending: Hematology and Oncology | Admitting: Hematology and Oncology

## 2021-02-22 ENCOUNTER — Other Ambulatory Visit: Payer: Self-pay

## 2021-02-22 ENCOUNTER — Ambulatory Visit (INDEPENDENT_AMBULATORY_CARE_PROVIDER_SITE_OTHER): Payer: Medicare Other | Admitting: Infectious Disease

## 2021-02-22 ENCOUNTER — Encounter: Payer: Self-pay | Admitting: Infectious Disease

## 2021-02-22 ENCOUNTER — Encounter: Payer: Self-pay | Admitting: Hematology and Oncology

## 2021-02-22 VITALS — BP 107/70 | HR 98 | Temp 97.8°F | Resp 16 | Ht 62.0 in | Wt 96.6 lb

## 2021-02-22 DIAGNOSIS — F17210 Nicotine dependence, cigarettes, uncomplicated: Secondary | ICD-10-CM

## 2021-02-22 DIAGNOSIS — I700 Atherosclerosis of aorta: Secondary | ICD-10-CM

## 2021-02-22 DIAGNOSIS — C50212 Malignant neoplasm of upper-inner quadrant of left female breast: Secondary | ICD-10-CM

## 2021-02-22 DIAGNOSIS — Z8619 Personal history of other infectious and parasitic diseases: Secondary | ICD-10-CM

## 2021-02-22 DIAGNOSIS — B479 Mycetoma, unspecified: Secondary | ICD-10-CM

## 2021-02-22 DIAGNOSIS — Z171 Estrogen receptor negative status [ER-]: Secondary | ICD-10-CM | POA: Diagnosis not present

## 2021-02-22 DIAGNOSIS — R21 Rash and other nonspecific skin eruption: Secondary | ICD-10-CM

## 2021-02-22 DIAGNOSIS — B449 Aspergillosis, unspecified: Secondary | ICD-10-CM

## 2021-02-22 DIAGNOSIS — L270 Generalized skin eruption due to drugs and medicaments taken internally: Secondary | ICD-10-CM | POA: Insufficient documentation

## 2021-02-22 DIAGNOSIS — Z1231 Encounter for screening mammogram for malignant neoplasm of breast: Secondary | ICD-10-CM

## 2021-02-22 HISTORY — DX: Rash and other nonspecific skin eruption: R21

## 2021-02-22 NOTE — Progress Notes (Signed)
Subjective:  Complaint: Difficulty gaining weight  Patient ID: Melissa [name], female    DOB: [birth year], 55 y.o.   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  Along the way she was diagnosed with breast cancer 2013 underwent lumpectomy and chemotherapy.  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  She is following with Dr. Lindi Adie with oncology.  She is subsequent been found to have other metastases including to her sternum and liver.  She developed cavitary lung infection and underwent bronchoscopy which revealed Aspergillus fumigatus.  She was on Cresemba since then.  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  She also has some spiculated lesions in the posterior left lobe lobe of her lung.  Radiology is concerned that the apparent mycetoma is due to Aspergillus or another mold.  There was similar concerned that the spiculated areas could represent Aspergillus though we have no culture data to guide [name].  She continued on Cresemba at that point  She then had bronchoscopy with BAL.  Fungal cultures from the bronchoscopy have been negative though the patient was on Cresemba at the time.  There is 1 AFB culture that is positive but not for tuberculosis.  Since then apparently (and I had to go to [employer] on web (not available on epic) they stated that Mycobacteria could NOT grow on subculture so no ID  In the interim the patient's insurance had denied Cresemba and she has been changed to voriconazole.  Unfortunately she has been found to  have metastases of her breast cancer to the brain.  She  underwent stereotactic radiation to these lesions by Dr. London Pepper  Her chemotherapeutic regimen included Tukysa (tucatinib) which can elevate voriconazole levels and potentially cause voriconazole toxicity including QT prolongation.  We checjked her voriconazole levels and they have been in the therapeutic range recently  EKG showed:  her EKG showed a QTC of 448 with a QTC of 465 ms last EKG that I could find in the computer that I could actually read was in 2019 in which she had a QTC of 442.   She did tell us that she is coughing less since being on voriconazole.   .  She had MRI of the brain done this February 2022 showed decrease in size of 3 lesions but multiple new lesions.  Her regimen was Xeloda Herceptin and Tucatinib  I last saw her she was developing a rash on her hands which worsened as well as on her lips.  Ultimately her oncologist Dr. Lindi Adie stopped her Xeloda and Tucatinib and she is on Herceptin alone.   Plan from a treatment standpoint for her breast cancer is for her to get repeat MRI and then to make a decision afterwards with regards to her treatment.  As far as her cough she continues to have a daily cough in the morning with blood-tinged sputum but this improves throughout most the day.  Cough was difficultly worse along with wheezing when she was in [place] which she attributes to the much more intense humidity that she experienced  there when she was traveling a few weeks ago.  Since coming back to [place] she has had improvement in her cough.      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/26/2020   Asthma    Breast cancer ([facility])    Bronchiectasis ([facility])    Bronchiolitis    Cancer ([facility])    breast cancer 2014   Cancer, metastatic to liver ([facility])    1610   Complication of anesthesia    bp dropped + desat    COPD (chronic  obstructive pulmonary disease) ([facility])    Dyspnea    DOE   GERD (gastroesophageal reflux disease)    H/O coccidioidomycosis    was reason for lung lobectomy   Headache(784.0)    due to eye strain or not eating   History of anemia    no current problem   History of asthma    as a child   History of breast cancer 2014   left   History of chemotherapy    finished 07/17/2013   History of hiatal hernia    AGE 7   Hx of radiation therapy 03/25/13-05/06/13   left breast 5000 cGy/25 sessions, left breast boost 1000 cGy/5 sessions   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   Pneumonia    LAST FLARE UP 01/2018   Rash 02/22/2021   Runny nose 07/30/2013   clear drainage   Wears dentures    upper    Past Surgical History:  Procedure Laterality Date   APPLICATION OF CRANIAL NAVIGATION N/A 08/04/2017   Procedure: APPLICATION OF CRANIAL NAVIGATION;  Surgeon: [name], [name], MD;  Location: [facility];  Service: Neurosurgery;  Laterality: N/A;   AXILLARY LYMPH NODE DISSECTION Left 02/08/2013   Procedure: LEFT AXILLARY DISSECTION;  Surgeon: Edward Jolly, MD;  Location: [facility];  Service: General;  Laterality: Left;   BREAST CYST EXCISION Right 2006   BREAST LUMPECTOMY Left 2014   BREAST LUMPECTOMY WITH NEEDLE LOCALIZATION AND AXILLARY SENTINEL LYMPH NODE BX Left 12/31/2012   Procedure: NEEDLE LOCALIZATION LEFT BREAST LUMPECTOMY AND LEFT AXILLARY SENTENIAL LYMPH NODE BX;  Surgeon: Edward Jolly, MD;  Location: [facility];  Service: General;  Laterality: Left;   BRONCHIAL BRUSHINGS  03/17/2020   Procedure: BRONCHIAL BRUSHINGS;  Surgeon: Garner Nash, DO;  Location: [facility];  Service: Pulmonary;;   BRONCHIAL WASHINGS  03/17/2020   Procedure: BRONCHIAL WASHINGS;  Surgeon: Garner Nash, DO;  Location: [facility];  Service: Pulmonary;;   CESAREAN SECTION  1995/1996   CRANIOTOMY Right 08/04/2017   Procedure: Right Frontal craniotomy for resection of tumor with  stereotactic navigation;  Surgeon: [name], [name], MD;  Location: [facility];  Service: Neurosurgery;  Laterality: Right;  Right Frontal craniotomy for resection of tumor with stereotactic navigation   IR CV LINE INJECTION  02/19/2021   IR IMAGING GUIDED PORT INSERTION  05/31/2019   LUNG LOBECTOMY Left 05/1996   upper lobe - due to [facility] Fever   PORT-A-CATH REMOVAL Right 08/02/2013   Procedure: REMOVAL PORT-A-CATH;  Surgeon: Edward Jolly, MD;  Location: [facility];  Service: General;  Laterality: Right;   PORTACATH PLACEMENT  07/02/2012   Procedure: INSERTION PORT-A-CATH;  Surgeon: Edward Jolly, MD;  Location: [facility];  Service: General;  Laterality: N/A;  right   VIDEO BRONCHOSCOPY N/A 03/17/2020   Procedure: VIDEO BRONCHOSCOPY;  Surgeon: Garner Nash, DO;  Location: [facility];  Service: Pulmonary;  Laterality: N/A;   VIDEO BRONCHOSCOPY WITH ENDOBRONCHIAL ULTRASOUND N/A 05/23/2018   Procedure: VIDEO BRONCHOSCOPY WITH ENDOBRONCHIAL ULTRASOUND;  Surgeon: Garner Nash, DO;  [facility];  Service: Thoracic;  Laterality: N/A;    Family History  Problem Relation Age of Onset   Emphysema Mother        was a smoker   Heart disease Mother    Melanoma Mother        dx in her 49s   Breast cancer Mother 91   Asthma Brother    Breast cancer Cousin        mother's maternal cousin; dx in her 23s      Social History   Socioeconomic History   Marital status: Married    Spouse name: Not on file   Number of children: 2   Years of education: Not on file   Highest education level: Not on file  Occupational History   Occupation: Secondary school teacher  Tobacco Use   Smoking status: Every Day    Packs/day: 0.30    Years: 38.00    Pack years: 11.40    Types: Cigarettes   Smokeless tobacco: Never   Tobacco comments:    10 cigarettes smoked daily 10/19/20 [facility]   Vaping Use   Vaping Use: Never used  Substance and Sexual Activity   Alcohol  use: No   Drug use: No   Sexual activity: Not Currently  Other Topics Concern   Not on file  Social History Narrative   Not on file   Social Determinants of Health   Financial Resource Strain: Not on file  Food Insecurity: Not on file  Transportation Needs: Not on file  Physical Activity: Not on file  Stress: Not on file  Social Connections: Not on file    Allergies  Allergen Reactions   Aspirin Anaphylaxis and Shortness Of Breath    THROAT CLOSES   Protonix [Pantoprazole] Nausea Only and Other (See Comments)    Also caused a "film in the mouth" and caused chest pressure   Iodinated Diagnostic Agents Rash    Patient allergic to contrast used in radiation oncology for CT simulation      Current Outpatient Medications:    albuterol (VENTOLIN HFA) 108 (90 Base) MCG/ACT inhaler, Inhale 2 puffs into the lungs every 6 (six) hours as needed., Disp: 8 g, Rfl: 5   betamethasone valerate ointment (VALISONE) 0.1 %, Apply 1 application topically 2 (two) times daily., Disp: 30 g, Rfl: 0   budesonide-formoterol (SYMBICORT) 160-4.5 MCG/ACT inhaler, Inhale 2 puffs into the lungs 2 (two) times daily., Disp: 3 each, Rfl: 3   chlorpheniramine-HYDROcodone (TUSSIONEX) 10-8 MG/5ML SUER, Take 5 mLs by mouth at bedtime., Disp: 115 mL, Rfl: 0   ELDERBERRY PO, Take by mouth daily. Per patient, takes one daily, Disp: , Rfl:    levalbuterol (XOPENEX) 0.63 MG/3ML nebulizer solution, Take 3 mLs (0.63 mg total) by nebulization every 4 (four) hours as needed for wheezing or shortness of breath., Disp: 540 mL, Rfl: 6   lidocaine-prilocaine (EMLA) cream, APPLY EXTERNALLY TO THE AFFECTED AREA 1 TIME, Disp: 30 g, Rfl: 3   naproxen sodium (ALEVE) 220 MG tablet, Take 440 mg by mouth 2 (two) times daily as needed (headache). , Disp: , Rfl:    omeprazole (PRILOSEC) 40 MG capsule, TAKE 1 CAPSULE(40 MG) BY MOUTH DAILY, Disp: 30 capsule, Rfl: 9   voriconazole (VFEND) 200 MG tablet,  Take 1 tablet (200 mg total) by mouth  2 (two) times daily., Disp: 60 tablet, Rfl: 11   Review of Systems  Constitutional:  Positive for unexpected weight change. Negative for activity change, appetite change, chills, diaphoresis, fatigue and fever.  HENT:  Negative for congestion, rhinorrhea, sinus pressure, sneezing, sore throat and trouble swallowing.   Eyes:  Negative for photophobia, discharge and visual disturbance.  Respiratory:  Positive for cough and wheezing. Negative for chest tightness, shortness of breath and stridor.   Cardiovascular:  Negative for chest pain, palpitations and leg swelling.  Gastrointestinal:  Negative for abdominal distention, abdominal pain, anal bleeding, blood in stool, constipation, diarrhea, nausea and vomiting.  Genitourinary:  Negative for difficulty urinating, dysuria, flank pain and hematuria.  Musculoskeletal:  Negative for arthralgias, back pain, gait problem, joint swelling and myalgias.  Skin:  Negative for color change, pallor, rash and wound.  Neurological:  Negative for dizziness, tremors, weakness and light-headedness.  Hematological:  Negative for adenopathy. Does not bruise/bleed easily.  Psychiatric/Behavioral:  Negative for agitation, behavioral problems, confusion, decreased concentration, dysphoric mood and sleep disturbance.       Objective:   Physical Exam Constitutional:      General: She is not in acute distress.    Appearance: Normal appearance. She is well-developed. She is not ill-appearing or diaphoretic.  HENT:     Head: Normocephalic and atraumatic.     Right Ear: Hearing and external ear normal.     Left Ear: Hearing and external ear normal.     Nose: No nasal deformity or rhinorrhea.  Eyes:     General: No scleral icterus.    Conjunctiva/sclera: Conjunctivae normal.     Right eye: Right conjunctiva is not injected.     Left eye: Left conjunctiva is not injected.     Pupils: Pupils are equal, round, and reactive to light.  Neck:     Vascular: No JVD.   Cardiovascular:     Rate and Rhythm: Normal rate and regular rhythm.     Heart sounds: Normal heart sounds, S1 normal and S2 normal. No murmur heard.   No friction rub.  Pulmonary:     Effort: Prolonged expiration present.     Breath sounds: Examination of the left-lower field reveals decreased breath sounds. Decreased breath sounds and wheezing present.  Abdominal:     General: Bowel sounds are normal. There is no distension.     Palpations: Abdomen is soft.     Tenderness: There is no abdominal tenderness.  Musculoskeletal:        General: Normal range of motion.     Right shoulder: Normal.     Left shoulder: Normal.     Cervical back: Normal range of motion and neck supple.     Right hip: Normal.     Left hip: Normal.     Right knee: Normal.     Left knee: Normal.  Lymphadenopathy:     Head:     Right side of head: No submandibular, preauricular or posterior auricular adenopathy.     Left side of head: No submandibular, preauricular or posterior auricular adenopathy.     Cervical: No cervical adenopathy.     Right cervical: No superficial or deep cervical adenopathy.    Left cervical: No superficial or deep cervical adenopathy.  Skin:    General: Skin is warm and dry.     Coloration: Skin is not pale.     Findings: No abrasion, bruising, ecchymosis, erythema, lesion or  rash (Mycetoma:).     Nails: There is no clubbing.  Neurological:     General: No focal deficit present.     Mental Status: She is alert and oriented to person, place, and time.     Sensory: No sensory deficit.     Coordination: Coordination normal.     Gait: Gait normal.  Psychiatric:        Attention and Perception: She is attentive.        Mood and Affect: Mood normal.        Speech: Speech normal.        Behavior: Behavior normal. Behavior is cooperative.        Thought Content: Thought content normal.        Judgment: Judgment normal.   Hands  October 05, 2020:     Hands February 22, 2021:        Assessment & Plan:   Mycetoma:  I am continue her prescription for voriconazole.  I have reviewed labs drawn by Dr. Lindi Adie in July ncluding comprehensive metabolic panel which shows normal transaminases, chronically elevated alkaline phosphatase which is stable normal renal function.  CBC with differential is also reviewed showing normal white count stable hemoglobin hematocrit and platelets   Aspergillosis: As above we will continue her prescription for voriconazole  She also tells me she is going to have repeat CT of her chest performed this week and she would like me to see the results of this of asked if her oncologist can forward me the results of that study.  History of coccidiomycosis: Voriconazole will be active against this organism as well.  Severe rash due to her chemotherapy regimen: This is resolved after coming off of Tucatinib and Xeloda  Metastatic breast cancer: Following closely with Dr. Lindi Adie. MRI of brain is upcoming  Smoking: She has cut down considerably.

## 2021-02-23 ENCOUNTER — Other Ambulatory Visit: Payer: No Typology Code available for payment source

## 2021-02-23 ENCOUNTER — Ambulatory Visit: Payer: No Typology Code available for payment source

## 2021-02-23 ENCOUNTER — Ambulatory Visit: Payer: No Typology Code available for payment source | Admitting: Hematology and Oncology

## 2021-02-26 ENCOUNTER — Other Ambulatory Visit: Payer: Self-pay

## 2021-02-26 ENCOUNTER — Ambulatory Visit (HOSPITAL_COMMUNITY)
Admission: RE | Admit: 2021-02-26 | Discharge: 2021-02-26 | Disposition: A | Discharge status: Home / Self Care | Payer: Medicare Other | Source: Ambulatory Visit | Attending: Hematology and Oncology | Admitting: Hematology and Oncology

## 2021-02-26 DIAGNOSIS — Z171 Estrogen receptor negative status [ER-]: Secondary | ICD-10-CM | POA: Diagnosis present

## 2021-02-26 DIAGNOSIS — C7931 Secondary malignant neoplasm of brain: Secondary | ICD-10-CM | POA: Insufficient documentation

## 2021-02-26 DIAGNOSIS — C50212 Malignant neoplasm of upper-inner quadrant of left female breast: Secondary | ICD-10-CM | POA: Diagnosis not present

## 2021-02-26 DIAGNOSIS — C787 Secondary malignant neoplasm of liver and intrahepatic bile duct: Secondary | ICD-10-CM | POA: Insufficient documentation

## 2021-02-26 DIAGNOSIS — C50912 Malignant neoplasm of unspecified site of left female breast: Secondary | ICD-10-CM | POA: Insufficient documentation

## 2021-02-28 NOTE — Progress Notes (Signed)
Patient Care Team: Patient, [name] as PCP - General (General Practice) Domenick Gong, MD as Referring Physician (Emergency Medicine)  DIAGNOSIS:    ICD-10-CM   1. 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 Malignant neoplasm of upper-inner quadrant of left breast in female, estrogen receptor negative (HCC)  C50.212    Z17.1       SUMMARY OF ONCOLOGIC HISTORY: Oncology History  Breast cancer of upper-inner quadrant of left female breast (HCC)  06/08/2012 Initial Diagnosis   invasive ductal carcinoma that was ER positive PR positive HER-2/neu positive measuring 3.1 cm by MRI criteria. Ki-67 was 70% HER-2 was amplified with a ratio 2.91   07/12/2012 - 07/17/2013 Neo-Adjuvant Chemotherapy   TCH 6 followed by Herceptin maintenance   12/11/2012 Surgery   Left breast lumpectomy: 1.8 cm tumor 1 positive sentinel node, axillary lymph node dissection 02/08/2013 showed 0/13 lymph nodes   03/25/2013 - 05/06/2013 Radiation Therapy   Adjuvant radiation therapy   06/05/2013 - 07/20/2017 Anti-estrogen oral therapy   Tamoxifen 20 mg daily   07/27/2017 Relapse/Recurrence   MRI Brain: 3.4 x 2.9 x 2.9 cm RIGHT frontal lobe mass with imaging characteristics of solitary metastasis. Extensive vasogenic edema resulting in 9 mm RIGHT to LEFT midline shift. Equivocal very early LEFT ventricle entrapment.    08/04/2017 Surgery   Rt frontal brain resection: Poorly differentiated tumor IHC suggests breast primary ER and PR Positive   08/25/2017 - 09/04/2017 Radiation Therapy   Stereotactic radiation   09/18/2017 -  Anti-estrogen oral therapy   Lapatinib with letrozole   12/18/2017 - 12/19/2017 Radiation Therapy   New right parietal lobe metastases status post [facility]   08/27/2018 - 08/27/2020 Radiation Therapy   SRS to new brain metastases   05/09/2019 Relapse/Recurrence   Interval increase in size of the enhancing nodular left internal mammary soft tissue 2.8  cm.  Redemonstrated enlarged supraclavicular, lower cervical and lower posterior cervical nodes unchanged.  Interval increase in the bony erosion of the posterior and lateral left third rib, increasing soft tissue lesion eroding the left sternal body 3.1 cm was 2.5 cm.  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 with above-mentioned history of metastatic breast cancer with brain metastasis s/p resection who is currently on treatment with Xeloda and Herceptin. She reports to the clinic today for cycle 13.   ALLERGIES:  is allergic to aspirin, protonix [pantoprazole], and iodinated diagnostic agents.  MEDICATIONS:  Current Outpatient Medications  Medication Sig Dispense Refill   albuterol (VENTOLIN HFA) 108 (90 Base) MCG/ACT inhaler Inhale 2 puffs into the lungs every 6 (six) hours as needed. 8 g 5   betamethasone valerate ointment (VALISONE) 0.1 % Apply 1 application topically 2 (two) times daily. 30 g 0   budesonide-formoterol (SYMBICORT) 160-4.5 MCG/ACT inhaler Inhale 2 puffs into the lungs 2 (two) times daily. 3 each 3   chlorpheniramine-HYDROcodone (TUSSIONEX) 10-8 MG/5ML SUER Take 5 mLs by mouth at bedtime. 115 mL 0  ELDERBERRY PO Take by mouth daily. Per patient, takes one daily     levalbuterol (XOPENEX) 0.63 MG/3ML nebulizer solution Take 3 mLs (0.63 mg total) by nebulization every 4 (four) hours as needed for wheezing or shortness of breath. 540 mL 6   lidocaine-prilocaine (EMLA) cream APPLY EXTERNALLY TO THE AFFECTED AREA 1 TIME 30 g 3    naproxen sodium (ALEVE) 220 MG tablet Take 440 mg by mouth 2 (two) times daily as needed (headache).      omeprazole (PRILOSEC) 40 MG capsule TAKE 1 CAPSULE(40 MG) BY MOUTH DAILY 30 capsule 9   voriconazole (VFEND) 200 MG tablet Take 1 tablet (200 mg total) by mouth 2 (two) times daily. 60 tablet 11   No current facility-administered medications for this visit.    PHYSICAL EXAMINATION: ECOG PERFORMANCE STATUS: 1 - Symptomatic but completely ambulatory  Vitals:   03/01/21 0837  BP: (!) 113/59  Pulse: 68  Resp: 18  Temp: (!) 97.3 F (36.3 C)  SpO2: 100%   Filed Weights   03/01/21 0837  Weight: 95 lb 14.4 oz (43.5 kg)     LABORATORY DATA:  I have reviewed the data as listed CMP Latest Ref Rng & Units 01/12/2021 11/30/2020 11/09/2020  Glucose 70 - 99 mg/dL 75 84 156(H)  BUN 6 - 20 mg/dL $Remove'15 14 18  'AbzYKaf$ Creatinine 0.44 - 1.00 mg/dL 0.81 0.76 0.86  Sodium 135 - 145 mmol/L 140 140 137  Potassium 3.5 - 5.1 mmol/L 4.0 4.3 3.8  Chloride 98 - 111 mmol/L 106 108 106  CO2 22 - 32 mmol/L $RemoveB'25 25 23  'KpsbpsZr$ Calcium 8.9 - 10.3 mg/dL 9.0 8.9 9.1  Total Protein 6.5 - 8.1 g/dL 7.6 6.9 7.1  Total Bilirubin 0.3 - 1.2 mg/dL <0.2(L) <0.2(L) <0.2(L)  Alkaline Phos 38 - 126 U/L 143(H) 154(H) 185(H)  AST 15 - 41 U/L 18 14(L) 20  ALT 0 - 44 U/L $Remo'10 8 9    'VEZqm$ Lab Results  Component Value Date   WBC 9.8 01/12/2021   HGB 9.2 (L) 01/12/2021   HCT 29.4 (L) 01/12/2021   MCV 86.5 01/12/2021   PLT 324 01/12/2021   NEUTROABS 6.5 01/12/2021    ASSESSMENT & PLAN:  Breast cancer of upper-inner quadrant of left female breast ([place]) 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  [place] brain: 08/25/2017-09/04/2017 2. Anti Her 2 therapy with Lapatinib started 09/17/2017-05/14/2019: Stopped for progression 3.  I discontinued tamoxifen and started her on letrozole 2.5 mg daily.  05/14/2019 stopped for progression 4.  Stereotactic radiosurgery 12/19/2017 to the new right parietal lobe metastases. 5.  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 Severe anemia: Asymptomatic we will watch.  Previously received blood transfusion.  Today's hemoglobin is 9 2. Chronic dry cough: I renewed a prescription for cough syrup 3.  Painful erythema of her hands: I discontinued Xeloda and Tukysa. 4.  Blisters on the lips: Resolved   Rash on the dorsum of the hands: Continuing to use moisturizers.  Tucatinib has been discontinued.  It has also resolved   Pustular lesions on the dorsum of the hand: Resolved  CT CAP 02/26/2021: Increase in bronchial dilation and  cavitation with nodularity worsening in the left lung base.  Improvement in nodular areas in the left upper lobe.  No distant metastatic disease. Mammogram 02/23/2021: Benign breast density category C Brain MRI has been scheduled for 03/05/2021   Return to clinic every 3 weeks for Herceptin.   We will await the results of the brain MRI in August and after that we will make a decision regarding Tucatinib.    No orders of the defined types were placed in this encounter.  The patient has a good understanding of the overall plan. she agrees with it. she will call with any problems that may develop before the next visit here.  Total time spent: 30 mins including face to face time and time spent for planning, charting and coordination of care  Rulon Eisenmenger, MD, MPH 03/01/2021  I, Thana Ates, am acting as scribe for Dr. Nicholas Lose.  I have reviewed the above documentation for accuracy and completeness, and I agree with the above.

## 2021-03-01 ENCOUNTER — Inpatient Hospital Stay (HOSPITAL_BASED_OUTPATIENT_CLINIC_OR_DEPARTMENT_OTHER): Payer: Medicare Other | Admitting: Hematology and Oncology

## 2021-03-01 ENCOUNTER — Other Ambulatory Visit: Payer: Self-pay

## 2021-03-01 ENCOUNTER — Inpatient Hospital Stay: Payer: Medicare Other

## 2021-03-01 VITALS — BP 113/59 | HR 68 | Temp 97.3°F | Resp 18 | Ht 62.0 in | Wt 95.9 lb

## 2021-03-01 DIAGNOSIS — Z171 Estrogen receptor negative status [ER-]: Secondary | ICD-10-CM

## 2021-03-01 DIAGNOSIS — C50912 Malignant neoplasm of unspecified site of left female breast: Secondary | ICD-10-CM

## 2021-03-01 DIAGNOSIS — C7931 Secondary malignant neoplasm of brain: Secondary | ICD-10-CM

## 2021-03-01 DIAGNOSIS — Z95828 Presence of other vascular implants and grafts: Secondary | ICD-10-CM

## 2021-03-01 DIAGNOSIS — C50212 Malignant neoplasm of upper-inner quadrant of left female breast: Secondary | ICD-10-CM | POA: Diagnosis not present

## 2021-03-01 DIAGNOSIS — Z5112 Encounter for antineoplastic immunotherapy: Secondary | ICD-10-CM | POA: Diagnosis not present

## 2021-03-01 LAB — CBC WITH DIFFERENTIAL (CANCER CENTER ONLY)
Abs Immature Granulocytes: 0.02 10*3/uL (ref 0.00–0.07)
Basophils Absolute: 0.1 10*3/uL (ref 0.0–0.1)
Basophils Relative: 1 %
Eosinophils Absolute: 0.8 10*3/uL — ABNORMAL HIGH (ref 0.0–0.5)
Eosinophils Relative: 7 %
HCT: 27.8 % — ABNORMAL LOW (ref 36.0–46.0)
Hemoglobin: 8.8 g/dL — ABNORMAL LOW (ref 12.0–15.0)
Immature Granulocytes: 0 %
Lymphocytes Relative: 22 %
Lymphs Abs: 2.3 10*3/uL (ref 0.7–4.0)
MCH: 25.7 pg — ABNORMAL LOW (ref 26.0–34.0)
MCHC: 31.7 g/dL (ref 30.0–36.0)
MCV: 81.3 fL (ref 80.0–100.0)
Monocytes Absolute: 1 10*3/uL (ref 0.1–1.0)
Monocytes Relative: 10 %
Neutro Abs: 6.2 10*3/uL (ref 1.7–7.7)
Neutrophils Relative %: 60 %
Platelet Count: 342 10*3/uL (ref 150–400)
RBC: 3.42 MIL/uL — ABNORMAL LOW (ref 3.87–5.11)
RDW: 19.3 % — ABNORMAL HIGH (ref 11.5–15.5)
WBC Count: 10.4 10*3/uL (ref 4.0–10.5)
nRBC: 0 % (ref 0.0–0.2)

## 2021-03-01 LAB — CMP (CANCER CENTER ONLY)
ALT: 9 U/L (ref 0–44)
AST: 11 U/L — ABNORMAL LOW (ref 15–41)
Albumin: 3.1 g/dL — ABNORMAL LOW (ref 3.5–5.0)
Alkaline Phosphatase: 108 U/L (ref 38–126)
Anion gap: 9 (ref 5–15)
BUN: 10 mg/dL (ref 6–20)
CO2: 25 mmol/L (ref 22–32)
Calcium: 9.3 mg/dL (ref 8.9–10.3)
Chloride: 105 mmol/L (ref 98–111)
Creatinine: 0.79 mg/dL (ref 0.44–1.00)
GFR, Estimated: >60 mL/min (ref >60)
Glucose, Bld: 88 mg/dL (ref 70–99)
Potassium: 3.8 mmol/L (ref 3.5–5.1)
Sodium: 139 mmol/L (ref 135–145)
Total Bilirubin: <0.2 mg/dL — ABNORMAL LOW (ref 0.3–1.2)
Total Protein: 6.9 g/dL (ref 6.5–8.1)

## 2021-03-01 MED ORDER — SODIUM CHLORIDE 0.9 % IV SOLN: Freq: Once | INTRAVENOUS | Status: AC

## 2021-03-01 MED ORDER — TRASTUZUMAB-DKST CHEMO 150 MG IV SOLR
6.0000 mg/kg | Freq: Once | INTRAVENOUS | Status: AC
  Administered 2021-03-01: 273 mg via INTRAVENOUS
  Filled 2021-03-01: qty 13

## 2021-03-01 MED ORDER — SODIUM CHLORIDE 0.9% FLUSH
10.0000 mL | INTRAVENOUS | Status: DC | PRN
  Administered 2021-03-01: 10 mL

## 2021-03-01 MED ORDER — DIPHENHYDRAMINE HCL 25 MG PO CAPS
25.0000 mg | ORAL_CAPSULE | Freq: Once | ORAL | Status: AC
  Administered 2021-03-01: 25 mg via ORAL
  Filled 2021-03-01: qty 1

## 2021-03-01 MED ORDER — ACETAMINOPHEN 325 MG PO TABS
650.0000 mg | ORAL_TABLET | Freq: Once | ORAL | Status: AC
  Administered 2021-03-01: 650 mg via ORAL
  Filled 2021-03-01: qty 2

## 2021-03-01 MED ORDER — MEGESTROL ACETATE 20 MG PO TABS: 20.0000 mg | ORAL_TABLET | Freq: Every day | ORAL | 3 refills | Status: DC

## 2021-03-01 MED ORDER — SODIUM CHLORIDE 0.9% FLUSH
10.0000 mL | INTRAVENOUS | Status: DC | PRN
Start: 2021-03-01 — End: 2021-03-01
  Administered 2021-03-01: 10 mL

## 2021-03-01 MED ORDER — HEPARIN SOD (PORK) LOCK FLUSH 100 UNIT/ML IV SOLN
500.0000 [IU] | Freq: Once | INTRAVENOUS | Status: AC | PRN
  Administered 2021-03-01: 500 [IU]

## 2021-03-01 NOTE — Patient Instructions (Signed)
[facility] MEDICAL ONCOLOGY  Discharge Instructions: ?Thank you for choosing [facility] to provide your oncology and hematology care.  ? ?If you have a lab appointment with the Cancer Center, please go directly to the Cancer Center and check in at the registration area. ?  ?Wear comfortable clothing and clothing appropriate for easy access to any Portacath or PICC line.  ? ?We strive to give you quality time with your provider. You may need to reschedule your appointment if you arrive late (15 or more minutes).  Arriving late affects you and other patients whose appointments are after yours.  Also, if you miss three or more appointments without notifying the office, you may be dismissed from the clinic at the provider?s discretion.    ?  ?For prescription refill requests, have your pharmacy contact our office and allow 72 hours for refills to be completed.   ? ?Today you received the following chemotherapy and/or immunotherapy agents: Trastuzumab    ?  ?To help prevent nausea and vomiting after your treatment, we encourage you to take your nausea medication as directed. ? ?BELOW ARE SYMPTOMS THAT SHOULD BE REPORTED IMMEDIATELY: ?*FEVER GREATER THAN 100.4 F (38 ?C) OR HIGHER ?*CHILLS OR SWEATING ?*NAUSEA AND VOMITING THAT IS NOT CONTROLLED WITH YOUR NAUSEA MEDICATION ?*UNUSUAL SHORTNESS OF BREATH ?*UNUSUAL BRUISING OR BLEEDING ?*URINARY PROBLEMS (pain or burning when urinating, or frequent urination) ?*BOWEL PROBLEMS (unusual diarrhea, constipation, pain near the anus) ?TENDERNESS IN MOUTH AND THROAT WITH OR WITHOUT PRESENCE OF ULCERS (sore throat, sores in mouth, or a toothache) ?UNUSUAL RASH, SWELLING OR PAIN  ?UNUSUAL VAGINAL DISCHARGE OR ITCHING  ? ?Items with * indicate a potential emergency and should be followed up as soon as possible or go to the Emergency Department if any problems should occur. ? ?Please show the CHEMOTHERAPY ALERT CARD or IMMUNOTHERAPY ALERT CARD at check-in  to the Emergency Department and triage nurse. ? ?Should you have questions after your visit or need to cancel or reschedule your appointment, please contact [facility]ICAL [facility]COL[facility]  Dept: [phone]  and follow the prompts.  Office hours are 8:00 a.m. to 4:30 p.m. Monday - Friday. Please note that voicemails left after 4:00 p.m. may not be returned until the following business day.  We are closed weekends and major holidays. You have access to a nurse at all times for urgent questions. Please call the main number to the clinic Dept: [phone] and follow the prompts. ? ? ?For any non-urgent questions, you may also contact your provider using MyChart. We now offer e-Visits for anyone 18 and older to request care online for non-urgent symptoms. For details visit [email or web address]. ?  ?Also download the MyChart app! Go to the app store, search "MyChart", open the app, select , and log in with your MyChart username and password. ? ?Due to Covid, a mask is required upon entering the hospital/clinic. If you do not have a mask, one will be given to you upon arrival. For doctor visits, patients may have 1 support person aged 18 or older with them. For treatment visits, patients cannot have anyone with them due to current Covid guidelines and our immunocompromised population.  ? ?

## 2021-03-01 NOTE — Assessment & Plan Note (Signed)
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 Anti Her 2 therapy with Lapatinibstarted 09/17/2017-05/14/2019: Stopped for progression 3.I discontinuedtamoxifen and started her on letrozole 2.5 mg daily.05/14/2019 stopped for progression 4.Stereotactic radiosurgery 12/19/2017 to the new right parietal lobe metastases. 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 Previously received blood transfusion. Today's hemoglobin is  9 2.Chronic dry cough: I renewed a prescription for cough syrup 3.Painful erythema of her hands: I discontinued XelodaandTukysa. 4.Blisters on the lips: Resolved  Rash on the dorsum of the hands: Continuing to use moisturizers.Tucatinib has been discontinued.  It has also resolved  Pustular lesions on the dorsum of the hand: Resolved  CT CAP 02/26/2021: Increase in bronchial dilation and cavitation with nodularity worsening in the left lung base.  Improvement in nodular areas in the left upper lobe.  No distant metastatic disease. Mammogram 02/23/2021: Benign breast density category C Brain MRI has been scheduled for 03/05/2021   Return to clinic every 3 weeks for Herceptin.  She plans to go to [place] [employer] [place] for vacation starting tomorrow.  She is looking forward to that after a long time she is able to get away. We will await the results of the brain MRI in August and after that we will make a decision regarding Tucatinib.

## 2021-03-03 ENCOUNTER — Other Ambulatory Visit: Payer: Self-pay | Admitting: Infectious Disease

## 2021-03-03 ENCOUNTER — Telehealth: Payer: Self-pay

## 2021-03-03 ENCOUNTER — Telehealth: Payer: Self-pay | Admitting: Hematology and Oncology

## 2021-03-03 DIAGNOSIS — R918 Other nonspecific abnormal finding of lung field: Secondary | ICD-10-CM

## 2021-03-03 NOTE — Telephone Encounter (Signed)
Scheduled per 8/22 los. Called and spoke with pt confirmed 9/12 appts

## 2021-03-03 NOTE — Telephone Encounter (Signed)
Provider requesting sputum samples for testing. Patient will come Friday 8/26 to gather supplies. Supplies placed at the front desk for pick up.   Elizabethann Lackey Lorita Officer, RN

## 2021-03-05 ENCOUNTER — Other Ambulatory Visit (HOSPITAL_COMMUNITY): Payer: No Typology Code available for payment source

## 2021-03-05 ENCOUNTER — Ambulatory Visit
Admission: RE | Admit: 2021-03-05 | Discharge: 2021-03-05 | Disposition: A | Discharge status: Home / Self Care | Payer: Medicare Other | Source: Ambulatory Visit | Attending: Radiation Oncology | Admitting: Radiation Oncology

## 2021-03-05 ENCOUNTER — Other Ambulatory Visit: Payer: Self-pay

## 2021-03-05 ENCOUNTER — Encounter: Payer: Self-pay | Admitting: Hematology and Oncology

## 2021-03-05 DIAGNOSIS — C7931 Secondary malignant neoplasm of brain: Secondary | ICD-10-CM

## 2021-03-05 MED ORDER — SODIUM CHLORIDE 0.9% FLUSH
10.0000 mL | INTRAVENOUS | Status: DC | PRN
  Administered 2021-03-05: 10 mL via INTRAVENOUS

## 2021-03-05 MED ORDER — GADOBENATE DIMEGLUMINE 529 MG/ML IV SOLN
9.0000 mL | Freq: Once | INTRAVENOUS | Status: AC | PRN
  Administered 2021-03-05: 9 mL via INTRAVENOUS

## 2021-03-05 MED ORDER — HEPARIN SOD (PORK) LOCK FLUSH 100 UNIT/ML IV SOLN
500.0000 [IU] | Freq: Once | INTRAVENOUS | Status: AC
  Administered 2021-03-05: 500 [IU] via INTRAVENOUS

## 2021-03-08 ENCOUNTER — Encounter: Payer: Self-pay | Admitting: Urology

## 2021-03-08 NOTE — Progress Notes (Signed)
Patient reports headaches for the past 2-3 weeks, moderate fatigue, mild decline in vision and cognitive function. Denies breast pain, swelling or tenderness, skin irritation, auditory changes, nausea, motor skill/ gait/ ROM issues, aphasia, and oral issues.  Meaningful use questions complete and patient notified of 8:30am telephone visit on 03/10/21 and expressed understanding.  Patient would like to discuss MRI results during a separate visit, in person for a better visual explanation but is fine with her current telephone visit.

## 2021-03-09 ENCOUNTER — Other Ambulatory Visit: Payer: Medicare Other

## 2021-03-09 ENCOUNTER — Ambulatory Visit: Admission: RE | Admit: 2021-03-09 | Payer: Medicare Other | Source: Ambulatory Visit | Admitting: Urology

## 2021-03-09 ENCOUNTER — Other Ambulatory Visit: Payer: Self-pay

## 2021-03-09 DIAGNOSIS — R918 Other nonspecific abnormal finding of lung field: Secondary | ICD-10-CM

## 2021-03-10 ENCOUNTER — Ambulatory Visit
Admission: RE | Admit: 2021-03-10 | Discharge: 2021-03-10 | Disposition: A | Discharge status: Home / Self Care | Payer: Medicare Other | Source: Ambulatory Visit | Attending: Urology | Admitting: Urology

## 2021-03-10 ENCOUNTER — Telehealth: Payer: Self-pay | Admitting: Urology

## 2021-03-10 NOTE — Telephone Encounter (Signed)
I called and spoke with the patient on 03/09/2021 to review results of her recent MRI brain scan and recommendations from our recent brain tumor board discussion.  Unfortunately, on her most recent scan performed on 03/05/2021, there are 2 new lesions, with a 13 mm anterior right temporal lobe lesion with associated vasogenic edema and a punctate left cerebellar lesion.  We did have the physics team fuse her most recent images with prior treatment planning images and did confirm that these are indeed new lesions that have not previously been treated.  This scan also showed some enlargement of a previously treated parasagittal right frontal mass, now measuring 14 mm with new associated vasogenic edema, previously sub-centimeter.  Additionally, there was enlargement of a previously treated left occipital lesion now measuring 5 mm as compared to 3 mm previously.  Consensus recommendation from discussion at tumor board is to proceed with a single fraction SRS treatment to treat the 2 new lesions and Dr. Mickeal Skinner is planning to reach out to Dr. Lindi Adie regarding recommendations to consider changing her systemic therapy.  Unfortunately, she was unable to tolerate the tucatinib targeted therapy secondary to diffuse ulceration/blistering of her skin.  However, there may be other targeted therapies for consideration given the new evidence of disease progression in the brain.  We discussed all of this today and she is in agreement to proceed with the recommended SRS treatment.  She is tentatively scheduled for CT simulation at 8:30 AM on Thursday, 03/11/2021 and will meet with Dr. Tammi Klippel prior to her simulation to review the recent imaging in person, at her request.  She appears to have a good understanding of her disease and our treatment recommendations and is comfortable and in agreement with the stated plan.  We will share our discussion with Dr. Lindi Adie and move forward with treatment planning accordingly.  Nicholos Johns,  MMS, PA-C [place] [facility] [place]: [phone]  Fax: [phone]

## 2021-03-11 ENCOUNTER — Ambulatory Visit
Admission: RE | Admit: 2021-03-11 | Discharge: 2021-03-11 | Disposition: A | Discharge status: Home / Self Care | Payer: Medicare Other | Source: Ambulatory Visit | Attending: Radiation Oncology | Admitting: Radiation Oncology

## 2021-03-11 ENCOUNTER — Ambulatory Visit: Payer: Self-pay | Admitting: Urology

## 2021-03-11 ENCOUNTER — Other Ambulatory Visit: Payer: Self-pay

## 2021-03-11 VITALS — BP 114/59 | HR 82 | Temp 98.0°F | Resp 18 | Ht 62.0 in | Wt 94.5 lb

## 2021-03-11 DIAGNOSIS — C50212 Malignant neoplasm of upper-inner quadrant of left female breast: Secondary | ICD-10-CM | POA: Insufficient documentation

## 2021-03-11 DIAGNOSIS — Z510 Encounter for antineoplastic radiation therapy: Secondary | ICD-10-CM | POA: Insufficient documentation

## 2021-03-11 DIAGNOSIS — Z170 Estrogen receptor positive status [ER+]: Secondary | ICD-10-CM | POA: Insufficient documentation

## 2021-03-11 DIAGNOSIS — C7931 Secondary malignant neoplasm of brain: Secondary | ICD-10-CM | POA: Diagnosis present

## 2021-03-11 NOTE — Addendum Note (Signed)
Encounter addended by: Tyler Pita, MD on: 03/11/2021 9:53 AM  Actions taken: Clinical Note Signed, Order list changed

## 2021-03-11 NOTE — Progress Notes (Addendum)
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    ICD-10-CM   1. Brain metastasis ([place])  C79.31     2. Malignant neoplasm of upper-inner quadrant of left breast in female, estrogen receptor positive ([place])  C50.212    Z17.0      Interval Since Last Radiation:  7 months 08/27/20: These five targets were treated to a prescription dose of 20 G in a single fraction.      05/13/20:  SRS brain to the following 4 targets treated to a prescription dose of 20 Gy in a single fraction with a single isocenter:  PTV6 Mid Cerebellum 16m PTV7 Lt Occipital 425mPTV8 Lt Occipital 57m27mTV9 Lt Temporal 57mm4m9/14/2020:  SRS brain//PTV 5:  Right frontal 5 mm target was treated to a prescription dose of 20 Gy in a single fraction.   09/04/2018:   SRS Brain// Right Frontal, 2 targets / 20 Gy in 1 fraction PTV3: Ant Rt Frontal 11mm33my PTV4: Rt Frontal resection cavity 5mm  49my  12/18/2017: SRS brain//PTV2: 4 mm Rt Parietal lesion treated to 20 Gy in 1 Fx  08/25/2017, 08/28/2017, 08/30/2017, 09/01/2017, 09/04/2017: PTV1: post op SRS to right frontal lobe resection cavity in 5 fxs  Narrative:  In summary, she initially presented to the emergency Department on 07/27/2017 with complaints of headaches ongoing for approximately 5 weeks with associated nausea and vomiting. She was initially treated by her PCP for sinusitis without improvement.  She also has a history of migraine headaches and had ben evaluated in the ED at [facility]/05/2017 for treatment of what she thought was a typical migraine headache given the fact that she had her usual blurry vision and aura preceding the  headache. She reports that the headache did improve with treatment but the relief was short-lived as the headache returned the very next day. She followed up with her primary care physician and a CT of the head was going to be scheduled due to the persistent headaches but in the interim, [facility] the headache with associated nausea and vomiting. The headaches were occuring in the frontal lobe as well as bilateral temporal with occasional radiation to the occipital lobe and associated with blurry vision, decreased appetite, fatigue, nausea and vomiting.  She denied any difficulty with speech, memory, imbalance or focal weakness.   CT Head on admission 07/27/2017 showed a large right frontal lobe mass measuring 3.1 by 2.5 cm with significant surrounding edema with mass effect and right to left midline shift measuring 8 mm. A subsequent Brain MRI showed a 3.4 x 2.9 x 2.9 cm right frontal lobe mass with imaging characteristics of solitary metastasis and extensive vasogenic edema resulting in 9 mm right to left midline shift as well as equivocal very early left ventricle entrapment.     CT Chest, Abdomen, Pelvis on 07/28/2017 for disease staging showed no findings to suggest metastatic breast cancer involving the chest, abdomen, pelvis or osseous structures. 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 Surgical changes involving the  left breast and left axilla but no findings for local recurrence or regional adenopathy.  She proceeded with a gross total resection of the frontal lesion on 08/05/17 with Dr. Cyndy Freeze followed by adjuvant fractionated postop SRS radiotherapy to the resection cavity in February 2019. Final surgical pathology revealed poorly differentiated adenocarcinoma consistent with metastatic breast cancer, ER/PR positive and HER-2 positive.   She tolerated SRS treatment well and  initial post treatment MRI brain on 12/07/17 showed satisfactory appearance of the right anterior frontal lobe post treatment site with expected evolution but unfortunately also showed a single, new 2 - 3 mm lesion in the right parietal lobe without associated edema or mass-effect.  She elected to undergo salvage SRS treatment to the new lesion in the parietal lobe which was completed on 12/18/17 and tolerated well.  A follow up brain MRI on 05/18/18 showed a stable right frontal resection cavity with stable 4 mm nodular enhancement at the inferior cavity margin but no evidence of new disease.  Unfortunately, the follow up MRI brain scan on 08/23/2018 showed a new 11 mm inferior right frontal lesion with only mild associated edema and no mass-effect.  She reported that she had been having more frequent frontal headaches which were not severe and responded to Aleve and otherwise, had been feeling well.  She elected to proceed with a single fraction of SRS to this new brain metastasis as recommended and this was completed on  09/04/18 and tolerated very well.   She had bronchoscopy with Dr. Valeta Harms in 05/2018 and dx'ed with Aspergillus- ID following (Dr. Graylon Good).  She also continues in routine follow up with Dr. Melvyn Novas in Pulmonology.  Follow up MRI brain scan from 03/08/19 showed continued interval enlargement of the nodular focus with enhancement at the superior margin of the right frontal resection cavity, measuring 5 x 6 mm in diameter compared with 3 x 4.5 mm on the previous study. Two smaller foci of enhancement along the inferior margin were unchanged and there was no evidence of increasing mass effect or edema and no new lesions seen elsewhere within the brain.  She elected to proceed with SRS treatment of the progressive nodular lesion which was completed on 03/25/19 and tolerated well without any acute ill effects.    She developed systemic disease progression, particularly in the chest with increase in size of  enhancing nodular left internal mammary soft tissue mass, lymph nodes and left third rib lesion and soft tissue lesion eroding the left sternal body as well as a new, hypermetabolic 6.5 cm liver lesion seen on PET scan from 05/23/2019 despite continuing on lapatinib with Letrozole. She did have an ultrasound-guided biopsy of the liver mass on 06/05/2019 which confirmed metastatic breast cancer, HER-2 positive, ER/PR negative. Her systemic therapy was changed to [place] beginning on 06/19/2019, under the care and direction of Dr. Lindi Adie which she tolerated very well.  A follow-up CT chest from 07/19/2019 showed a positive response to treatment with decrease in size of the soft tissue mass in the breast as well as bony lesions and adenopathy.  Fortunately, her follow-up MRI brain from 08/02/2019 showed stability of the previously treated disease and no new or progressive findings. A repeat MRI brain scan 11/04/19 was felt to be overall stable but there was a slight change in a previously treated right inferior frontal lesion, measuring 5 mm, previously 4 mm, with a new, separate punctate focus of enhancement just posterior to that. This was such minimal change and when her prior  treatment fields were fused with recent scan, it appeared that the new area of enhancement was on the field border of GTV3 and would have received a large portion of the delivered dose.  Therefore, consensus recommendation was to simply monitor this area with routine 3 month follow up brain imaging. She had repeat MRI on 01/30/20 and this showed overall disease stability with an unchanged appearance of the 2 previously treated right frontal lobe deposits and no new or progressive disease. Repeat systemic disease staging scans were performed on 01/29/20 and 02/04/20.  The bone scan on 01/29/20 showed less uptake of tracer at the previously identified sternal and LEFT third rib lesions, consistent with response to therapy and there were no new sites of  osseous metastasis noted. The CT C/A/P performed on 02/04/20 also showed disease stability with a new cluster of spiculated nodule or opacities in the posterior left lung favoring aspergillosis or other atypical infectious etiology.  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  There was no definite evidence of metastatic disease within the abdomen or pelvis. She continued on Kadcycla, s/p 14 cycles as of 05/05/20.   Repeat MRI brain from 05/01/20 showed 4 new metastatic lesions, all subcentimeter with a 53m midline cerebellar vermian metastasis, 475mleft occipital metastasis, 40m69might occipital metastasis and a punctate lesion in the left temporal lobe.  The concensus recommendation was to proceed with a single fraction of SRS to the 4 new metastases which she was in agreement with and was completed on 05/13/21.  Her restaging systemic imaging from 05/28/20 was without evidence of any disease progression or recurrence in the chest, abdomen or pelvis.  At the time of her follow up with Dr. GudLindi Adie 05/05/2020, the decision was made to discontinue the Kadcycla, due to disease progression in the brain, and switch to Tucatinib, oral systemic therapy in combination with Herceptin and Xeloda which was started 06/11/20.  Her follow up brain MRI from 08/13/20 unfortunately showed disease progression with 5 new lesions: with a 2 mm left frontal lesion, 2 mm right frontal lesion, 2 mm right occipital lesion, 2 mm right temporal lesion and a 5 mm left temporal lesion.  The 1 mm right occipital lesion was initially thought to be new but on further review of prior treatment plans, it was noted to have been treated previously and stable.  Otherwise, the other previously treated lesions were improved and/or stable.  The consensus recommendation at brain conference was to  proceed with a single fraction of SRS to the 5 new brain lesions which she was in agreement with. Her most recent SRSShiloheatment was completed 08/27/20 and tolerated very well without any ill side effects aside from some low grade nausea for the first 48 hours but has since resolved completely.   In May, her surveillance MRI showed no new lesions.  She had brain MRI this past week showing 2 new brain mets and a 3rd enlarging brain met which has not previously been treated.     Interval History:   I met with the patient and her husband today to review the recent MRI.  She is asymptomatic.  She has been on hold for Tucatinib due to cutaneous reactions.                    ALLERGIES:  is allergic to aspirin,  protonix [pantoprazole], and iodinated diagnostic agents.  Meds: Current Outpatient Medications  Medication Sig Dispense Refill   albuterol (VENTOLIN HFA) 108 (90 Base) MCG/ACT inhaler Inhale 2 puffs into the lungs every 6 (six) hours as needed. 8 g 5   betamethasone valerate ointment (VALISONE) 0.1 % Apply 1 application topically 2 (two) times daily. 30 g 0   budesonide-formoterol (SYMBICORT) 160-4.5 MCG/ACT inhaler Inhale 2 puffs into the lungs 2 (two) times daily. 3 each 3   chlorpheniramine-HYDROcodone (TUSSIONEX) 10-8 MG/5ML SUER Take 5 mLs by mouth at bedtime. 115 mL 0   ELDERBERRY PO Take by mouth daily. Per patient, takes one daily     levalbuterol (XOPENEX) 0.63 MG/3ML nebulizer solution Take 3 mLs (0.63 mg total) by nebulization every 4 (four) hours as needed for wheezing or shortness of breath. 540 mL 6   lidocaine-prilocaine (EMLA) cream APPLY EXTERNALLY TO THE AFFECTED AREA 1 TIME 30 g 3   megestrol (MEGACE) 20 MG tablet Take 1 tablet (20 mg total) by mouth daily. 30 tablet 3   naproxen sodium (ALEVE) 220 MG tablet Take 440 mg by mouth 2 (two) times daily as needed (headache).      omeprazole (PRILOSEC) 40 MG capsule TAKE 1 CAPSULE(40 MG) BY MOUTH DAILY 30 capsule 9   voriconazole  (VFEND) 200 MG tablet Take 1 tablet (200 mg total) by mouth 2 (two) times daily. 60 tablet 11   No current facility-administered medications for this encounter.    Physical Findings: The patient is in no acute distress. Patient is alert and oriented.  height is _0  (1.575 m) and weight is 94 lb 8 oz (42.9 kg). Her temporal temperature is 98 F (36.7 C). Her blood pressure is 114/59 (abnormal) and her pulse is 82. Her respiration is 18 and oxygen saturation is 100%. .  No significant changes.  Lab Findings: Lab Results  Component Value Date   WBC 10.4 03/01/2021   WBC 7.2 05/05/2020   HGB 8.8 (L) 03/01/2021   HGB 13.1 08/12/2014   HCT 27.8 (L) 03/01/2021   HCT 40.4 08/12/2014   PLT 342 03/01/2021   PLT 228 08/12/2014    Lab Results  Component Value Date   NA 139 03/01/2021   NA 142 08/12/2014   K 3.8 03/01/2021   K 4.6 08/12/2014   CHLORIDE 107 08/12/2014   CO2 25 03/01/2021   CO2 26 08/12/2014   GLUCOSE 88 03/01/2021   GLUCOSE 94 08/12/2014   GLUCOSE 98 12/20/2012   BUN 10 03/01/2021   BUN 10.1 08/12/2014   CREATININE 0.79 03/01/2021   CREATININE 0.88 03/11/2019   CREATININE 0.9 08/12/2014   BILITOT <0.2 (L) 03/01/2021   BILITOT 0.25 08/12/2014   ALKPHOS 108 03/01/2021   ALKPHOS 68 08/12/2014   AST 11 (L) 03/01/2021   AST 15 08/12/2014   ALT 9 03/01/2021   ALT 11 08/12/2014   PROT 6.9 03/01/2021   PROT 7.1 08/12/2014   ALBUMIN 3.1 (L) 03/01/2021   ALBUMIN 3.8 08/12/2014   CALCIUM 9.3 03/01/2021   CALCIUM 9.3 08/12/2014   ANIONGAP 9 03/01/2021    Radiographic Findings: MR Brain W Wo Contrast  Result Date: 03/05/2021 CLINICAL DATA:  Treated breast carcinoma. EXAM: MRI HEAD WITHOUT AND WITH CONTRAST TECHNIQUE: Multiplanar, multiecho pulse sequences of the brain and surrounding structures were obtained without and with intravenous contrast. CONTRAST:  1m MULTIHANCE GADOBENATE DIMEGLUMINE 529 MG/ML IV SOLN COMPARISON:  11/26/2020 FINDINGS: BRAIN New Lesions:  1. 13 mm anterior right temporal lobe mass  which is not been seen on prior studies. 2. Punctate left cerebellar mass on 82:42 which was not certainly seen on prior studies Larger lesions: 1. Parasagittal right frontal mass on 12:133 measuring 14 mm, previously subcentimeter. 2. Cortically based left occipital lesion on 12:73 is 5 mm as compared to 3 mm previously. Stable or Smaller lesions: None. 1. Punctate lesion in the upper vermis 2. Anterior right frontal irregular enhancing nodule measuring 13 mm, unchanged on 12:77 3. 11 mm irregularly enhancing nodule beneath the craniotomy flap in the right frontal lobe on 12:111. Other Brain findings: New vasogenic edema is seen associated with the right temporal and high right parasagittal frontal lesions. Chronic mild vasogenic edema about the anterior right frontal lesion at treatment site. On black blood sequence faint enhancement at the bilateral lower cerebellum is attributed to vessels. Vascular: Normal flow voids and vascular enhancements. Skull and upper cervical spine: Unremarkable frontal craniotomy. Sinuses/Orbits: Negative IMPRESSION: 1. New 15 mm right temporal metastasis.  Associated vasogenic edema 2. New punctate metastasis in the left cerebellum. 3. Enlarged high right frontal metastasis with new vasogenic edema, now 14 mm 4. Mildly enlarged left occipital cortex metastasis, subcentimeter. Electronically Signed   By: Monte Fantasia M.D.   On: 03/05/2021 21:01   IR CV Line Injection  Result Date: 02/19/2021 INDICATION: 55 year old with breast cancer and right jugular Port-A-Cath. Recently, difficulty aspirating from the Port-A-Cath. EXAM: PORT INJECTION WITH FLUOROSCOPY MEDICATIONS: None ANESTHESIA/SEDATION: None FLUOROSCOPY TIME:  Fluoroscopy Time: 18 seconds, 3 mGy COMPLICATIONS: None immediate. PROCEDURE: Informed written consent was obtained from the patient after a thorough discussion of the procedural risks, benefits and alternatives. All  questions were addressed. A timeout was performed prior to the initiation of the procedure. The port was accessed in sterile fashion by the radiology nurse. The port aspirated and flushed well. Port was injected with contrast under fluoroscopic evaluation. Port was flushed with heparinized saline at the end of the procedure. Access needle was removed. FINDINGS: Right jugular Port-A-Cath with the tip at the superior cavoatrial junction. Port is patent without discontinuity or leakage. No evidence for a fibrin sheath. Contrast rapidly drains into the right atrium. IMPRESSION: Right jugular Port-A-Cath is appropriately positioned and functioning well. No evidence for a fibrin sheath. Electronically Signed   By: Markus Daft M.D.   On: 02/19/2021 10:17   MM 3D SCREEN BREAST BILATERAL  Result Date: 02/23/2021 CLINICAL DATA:  Screening. EXAM: DIGITAL SCREENING BILATERAL MAMMOGRAM WITH TOMOSYNTHESIS AND CAD TECHNIQUE: Bilateral screening digital craniocaudal and mediolateral oblique mammograms were obtained. Bilateral screening digital breast tomosynthesis was performed. The images were evaluated with computer-aided detection. COMPARISON:  Previous exam(s). ACR Breast Density Category c: The breast tissue is heterogeneously dense, which may obscure small masses. FINDINGS: There are no findings suspicious for malignancy. IMPRESSION: No mammographic evidence of malignancy. A result letter of this screening mammogram will be mailed directly to the patient. RECOMMENDATION: Screening mammogram in one year. (Code:[id number]-B-01Y) BI-RADS CATEGORY  1: Negative. Electronically Signed   By: Lajean Manes M.D.   On: 02/23/2021 09:21   CT CHEST ABDOMEN PELVIS WO CONTRAST  Result Date: 02/26/2021 CLINICAL DATA:  Breast cancer restaging evaluation in a 55 year old female. EXAM: CT CHEST, ABDOMEN AND PELVIS WITHOUT CONTRAST TECHNIQUE: Multidetector CT imaging of the chest, abdomen and pelvis was performed following the standard protocol  without IV contrast. COMPARISON:  Comparison is made with May 28, 2020. FINDINGS: CT CHEST FINDINGS Cardiovascular: RIGHT-sided Port-A-Cath terminates at the history is caval to atrial junction as  before. Normal caliber of the thoracic aorta. Normal caliber central pulmonary vessels. Normal heart size. Limited assessment of cardiovascular structures given lack of intravenous contrast. Mediastinum/Nodes: Esophagus grossly normal. No thoracic inlet lymphadenopathy. No axillary lymphadenopathy. Post LEFT axillary dissection. No internal mammary lymphadenopathy. No mediastinal lymphadenopathy. Lungs/Pleura: Post partial lung resection in the LEFT chest, LEFT upper lobectomy. Stable appearance of serpiginous cavitary areas in the LEFT upper lobe and bronchiectatic changes. LEFT upper chest, lower lobe pulmonary nodule 1.7 x 0.9 cm (image 50/4) previously 1.9 x 1.6 cm Further dilation of bronchial structures in the LEFT lower lobe, increasing density associated with material within a dilated bronchus on image 78 of series 4 1.3 x 0.9 cm. Also increasing density along the pleural surface with a nodule measuring 1.4 x 0.9 cm on image 71 of series 4 background pulmonary emphysema. No suspicious findings in the RIGHT chest. Musculoskeletal: See below for complete musculoskeletal details. No destructive bone finding about the bony thorax. CT ABDOMEN PELVIS FINDINGS Hepatobiliary: Smooth contour of the liver. No visible lesion. No pericholecystic stranding or gross biliary duct distension. Pancreas: Pancreas normal contour, no visible lesion or sign of inflammation. Spleen: Normal size and contour. Adrenals/Urinary Tract: Adrenal glands are normal. No hydronephrosis, perinephric stranding or contour abnormality of the bilateral kidneys. Urinary bladder with smooth contours without perivesical stranding. Stomach/Bowel: No small bowel dilation. Stomach without signs of adjacent stranding or significant thickening. No acute  colonic process. Stool throughout the colon. Vascular/Lymphatic: Calcified atheromatous plaque, minimal in the abdominal aorta without dilation. Smooth contour of the IVC. There is no gastrohepatic or hepatoduodenal ligament lymphadenopathy. No retroperitoneal or mesenteric lymphadenopathy. No pelvic sidewall lymphadenopathy. Reproductive: Unremarkable Other: No ascites. Musculoskeletal: No acute bone finding. No destructive bone process. IMPRESSION: 1. Post LEFT upper lobectomy as before with increase in areas of bronchial dilation and cavitation with associated nodularity, further worsening in the LEFT lung base. Findings again favored to reflect acute on chronic infection, in the setting of known fungal infection. 2. Slight interval improvement with nodular areas in the LEFT upper lobe. 3. No new or progressive oncologic findings. 4. No evidence of metastatic disease involving the abdomen or pelvis. 5. Emphysema and aortic atherosclerosis. Aortic Atherosclerosis (ICD10-I70.0) and Emphysema (ICD10-J43.9). Electronically Signed   By: Zetta Bills M.D.   On: 02/26/2021 18:08    Impression:  The patient has three brain metastases which have not been previously treated.  At this point, the patient would potentially benefit from radiotherapy. The options include whole brain irradiation versus stereotactic radiosurgery. There are pros and cons associated with each of these potential treatment options. Whole brain radiotherapy would treat the known metastatic deposits and help provide some reduction of risk for future brain metastases. However, whole brain radiotherapy carries potential risks including hair loss, subacute somnolence, and neurocognitive changes including a possible reduction in short-term memory. Whole brain radiotherapy also may carry a lower likelihood of tumor control at the treatment sites because of the low-dose used. Stereotactic radiosurgery carries a higher likelihood for local tumor control  at the targeted sites with lower associated risk for neurocognitive changes such as memory loss. However, the use of stereotactic radiosurgery in this setting may leave the patient at increased risk for new brain metastases elsewhere in the brain as high as 50-60%. Accordingly, patients who receive stereotactic radiosurgery in this setting should undergo ongoing surveillance imaging with brain MRI more frequently in order to identify and treat new small brain metastases before they become symptomatic. Stereotactic radiosurgery does carry  some different risks, including a risk of radionecrosis.  PLAN: Today, I reviewed the findings and workup thus far with the patient. We discussed the dilemma regarding whole brain radiotherapy versus stereotactic radiosurgery. We discussed the pros and cons of each. We also discussed the logistics and delivery of each. We reviewed the results associated with each of the treatments described above. While the patient and her husband are growing weary of repeating SRS so many times, they understand the rationale for it.  The patient seems to understand the treatment options and would like to proceed with stereotactic radiosurgery.  She will undergo simulation and treatment planning today.  I personally spent 30 minutes in this encounter including chart review, reviewing radiological studies, meeting face-to-face with the patient, entering orders and completing documentation.  _____________________________________  Sheral Apley Tammi Klippel, M.D.

## 2021-03-11 NOTE — Progress Notes (Signed)
  Radiation Oncology         [phone] ________________________________  Name: Melissa [name] MRN: [id number]  Date: 03/11/2021  DOB: 1966/03/29  SIMULATION AND TREATMENT PLANNING NOTE    ICD-10-CM   1. 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  Identity was confirmed.  All relevant records and images related to the planned course of therapy were reviewed.  The patient freely provided informed written consent to proceed with treatment after reviewing the details related to the planned course of therapy. The consent form was witnessed and verified by the simulation staff. Intravenous access was established for contrast administration. Then, the patient was set-up in a stable reproducible supine position for radiation therapy.  A relocatable thermoplastic stereotactic head frame was fabricated for precise immobilization.  CT images were obtained.  Surface markings were placed.  The CT images were loaded into the planning software and fused with the patient's targeting MRI scan.  Then the target and avoidance structures were contoured.  Treatment planning then occurred.  The radiation prescription was entered and confirmed.  I have requested 3D planning  I have requested a DVH of the following structures: Brain stem, brain, left eye, right eye, lenses, optic chiasm, target volumes, uninvolved brain, and normal tissue.    SPECIAL TREATMENT PROCEDURE:  The planned course of therapy using radiation constitutes a special treatment procedure. Special care is required in the management of this patient for the following reasons. This treatment constitutes a Special Treatment Procedure for the following reason: High dose per fraction requiring special monitoring for increased toxicities of treatment including daily imaging.  The special  nature of the planned course of radiotherapy will require increased physician supervision and oversight to ensure patient's safety with optimal treatment outcomes.  This requires extended time and effort.  PLAN:  The patient will receive 20 Gy in 1 fraction.  ________________________________  Sheral Apley Tammi Klippel, M.D.

## 2021-03-12 ENCOUNTER — Other Ambulatory Visit: Payer: Self-pay | Admitting: Hematology and Oncology

## 2021-03-12 DIAGNOSIS — C50912 Malignant neoplasm of unspecified site of left female breast: Secondary | ICD-10-CM

## 2021-03-12 DIAGNOSIS — C50212 Malignant neoplasm of upper-inner quadrant of left female breast: Secondary | ICD-10-CM

## 2021-03-12 DIAGNOSIS — C7931 Secondary malignant neoplasm of brain: Secondary | ICD-10-CM

## 2021-03-12 MED ORDER — HYDROCOD POLST-CPM POLST ER 10-8 MG/5ML PO SUER: 5.0000 mL | Freq: Every day | ORAL | 0 refills | Status: DC

## 2021-03-16 ENCOUNTER — Other Ambulatory Visit (HOSPITAL_COMMUNITY): Payer: Self-pay

## 2021-03-16 ENCOUNTER — Telehealth: Payer: Self-pay

## 2021-03-16 DIAGNOSIS — Z510 Encounter for antineoplastic radiation therapy: Secondary | ICD-10-CM | POA: Diagnosis not present

## 2021-03-16 NOTE — Telephone Encounter (Signed)
Patient called office today regarding concerns about getting medication. States that she currently does not have insurance to cover her Voriconazole. Will be insured under her husbands insurance plan starting October 1st. Pt has tried [employer] card, but medication is still too much.  Would like to know if office could help get medication assistance until then . Patient currently has enough medication for today. Will forward message to pharmacy team for assistance.

## 2021-03-17 ENCOUNTER — Encounter: Payer: Self-pay | Admitting: Radiation Oncology

## 2021-03-17 ENCOUNTER — Ambulatory Visit
Admission: RE | Admit: 2021-03-17 | Discharge: 2021-03-17 | Disposition: A | Discharge status: Home / Self Care | Payer: Medicare Other | Source: Ambulatory Visit | Attending: Radiation Oncology | Admitting: Radiation Oncology

## 2021-03-17 ENCOUNTER — Other Ambulatory Visit: Payer: Self-pay

## 2021-03-17 VITALS — BP 100/71 | HR 92 | Temp 98.1°F | Resp 20

## 2021-03-17 DIAGNOSIS — Z510 Encounter for antineoplastic radiation therapy: Secondary | ICD-10-CM | POA: Diagnosis not present

## 2021-03-17 DIAGNOSIS — C7931 Secondary malignant neoplasm of brain: Secondary | ICD-10-CM

## 2021-03-17 NOTE — Telephone Encounter (Signed)
Attempted to call patient to follow up to get income verification for medication assistance. Left voicemail for patient to call office back. Will send mychart message requesting patient call office back. Leatrice Jewels, RMA

## 2021-03-17 NOTE — Progress Notes (Signed)
Mrs. Raczynski rested with [name] for 30 minutes following her [place] treatment.  Patient denies headache, dizziness, nausea, diplopia or ringing in the ears. Denies fatigue. Patient without complaints. Understands to avoid strenuous activity for the next 24 hours and call [phone] with needs.  Ambulated out of the clinic without difficulty.  BP 100/71 (BP Location: Right Arm, Patient Position: Sitting, Cuff Size: Normal)   Pulse 92   Temp 98.1 F (36.7 C)   Resp 20   LMP 07/25/2012 Comment: pregnancy waiver form signed 01-18-2018  SpO2 99%    Chenise Mulvihill M. Leonie Green, BSN

## 2021-03-18 ENCOUNTER — Telehealth: Payer: Self-pay

## 2021-03-18 NOTE — Telephone Encounter (Signed)
[facility] Patient Advocate Encounter  Completed and sent [employer] application for Voriconazole for this patient who is uninsured.    Patient assistance phone number for follow up is [phone].  FAX [phone]  This encounter will be updated until final determination.   Ileene Patrick, [facility] Patient [facility] Phone: [phone] Fax:  [phone]

## 2021-03-21 NOTE — Progress Notes (Signed)
  Radiation Oncology         [phone] ________________________________  Stereotactic Treatment Procedure Note  Name: SOWMYA GEIER MRN: [id number]  Date: 03/17/2021  DOB: 02-28-1966  SPECIAL TREATMENT PROCEDURE    ICD-10-CM   1. Brain metastasis ([place])  C79.31       3D TREATMENT PLANNING AND DOSIMETRY:  The patient's radiation plan was reviewed and approved by neurosurgery and radiation oncology prior to treatment.  It showed 3-dimensional radiation distributions overlaid onto the planning CT/MRI image set.  The [facility] for the target structures as well as the organs at risk were reviewed. The documentation of the 3D plan and dosimetry are filed in the radiation oncology EMR.  NARRATIVE:  ALEXSSA LATTANZIO was brought to the TrueBeam stereotactic radiation treatment machine and placed supine on the CT couch. The head frame was applied, and the patient was set up for stereotactic radiosurgery.  Neurosurgery was present for the set-up and delivery  SIMULATION VERIFICATION:  In the couch zero-angle position, the patient underwent Exactrac imaging using the Brainlab system with orthogonal KV images.  These were carefully aligned and repeated to confirm treatment position for each of the isocenters.  The Exactrac snap film verification was repeated at each couch angle.  PROCEDURE: Craig Guess Trageser received stereotactic radiosurgery to the following targets: Three brain metastases targets (Rt Parafalcine 15 mm, Rt Temporal 13 mm, and Lt Cerebellar 3 mm) was treated using 6 Rapid Arc VMAT Beams to a prescription dose of 20 Gy.  ExacTrac registration was performed for each couch angle.  The 100% isodose line was prescribed.  6 MV X-rays were delivered in the flattening filter free beam mode. STEREOTACTIC TREATMENT MANAGEMENT:  Following delivery, the patient was transported to nursing in stable condition and monitored for possible acute effects.  Vital signs were recorded BP 100/71 (BP Location: Right Arm,  Patient Position: Sitting, Cuff Size: Normal)   Pulse 92   Temp 98.1 F (36.7 C)   Resp 20   LMP 07/25/2012 Comment: pregnancy waiver form signed 01-18-2018  SpO2 99% . The patient tolerated treatment without significant acute effects, and was discharged to home in stable condition.    PLAN: Follow-up in one month.  ________________________________  Sheral Apley. Tammi Klippel, M.D.

## 2021-03-22 ENCOUNTER — Inpatient Hospital Stay (HOSPITAL_BASED_OUTPATIENT_CLINIC_OR_DEPARTMENT_OTHER): Payer: Medicare Other | Admitting: Adult Health

## 2021-03-22 ENCOUNTER — Inpatient Hospital Stay: Payer: Medicare Other | Attending: Medical

## 2021-03-22 ENCOUNTER — Inpatient Hospital Stay: Payer: Medicare Other

## 2021-03-22 ENCOUNTER — Encounter: Payer: Self-pay | Admitting: Adult Health

## 2021-03-22 ENCOUNTER — Other Ambulatory Visit: Payer: Self-pay

## 2021-03-22 ENCOUNTER — Telehealth: Payer: Self-pay | Admitting: *Deleted

## 2021-03-22 ENCOUNTER — Inpatient Hospital Stay: Payer: Medicare Other | Attending: Oncology

## 2021-03-22 VITALS — BP 114/65 | HR 78 | Temp 97.4°F | Resp 18 | Ht 62.0 in | Wt 95.1 lb

## 2021-03-22 DIAGNOSIS — C50212 Malignant neoplasm of upper-inner quadrant of left female breast: Secondary | ICD-10-CM | POA: Insufficient documentation

## 2021-03-22 DIAGNOSIS — C7931 Secondary malignant neoplasm of brain: Secondary | ICD-10-CM

## 2021-03-22 DIAGNOSIS — C787 Secondary malignant neoplasm of liver and intrahepatic bile duct: Secondary | ICD-10-CM | POA: Insufficient documentation

## 2021-03-22 DIAGNOSIS — Z170 Estrogen receptor positive status [ER+]: Secondary | ICD-10-CM | POA: Insufficient documentation

## 2021-03-22 DIAGNOSIS — Z5111 Encounter for antineoplastic chemotherapy: Secondary | ICD-10-CM | POA: Insufficient documentation

## 2021-03-22 DIAGNOSIS — Z452 Encounter for adjustment and management of vascular access device: Secondary | ICD-10-CM | POA: Insufficient documentation

## 2021-03-22 DIAGNOSIS — C50912 Malignant neoplasm of unspecified site of left female breast: Secondary | ICD-10-CM

## 2021-03-22 DIAGNOSIS — Z5112 Encounter for antineoplastic immunotherapy: Secondary | ICD-10-CM | POA: Diagnosis present

## 2021-03-22 DIAGNOSIS — Z95828 Presence of other vascular implants and grafts: Secondary | ICD-10-CM

## 2021-03-22 DIAGNOSIS — C7951 Secondary malignant neoplasm of bone: Secondary | ICD-10-CM | POA: Diagnosis not present

## 2021-03-22 LAB — CBC WITH DIFFERENTIAL (CANCER CENTER ONLY)
Abs Immature Granulocytes: 0.04 10*3/uL (ref 0.00–0.07)
Basophils Absolute: 0.1 10*3/uL (ref 0.0–0.1)
Basophils Relative: 1 %
Eosinophils Absolute: 1 10*3/uL — ABNORMAL HIGH (ref 0.0–0.5)
Eosinophils Relative: 8 %
HCT: 29.9 % — ABNORMAL LOW (ref 36.0–46.0)
Hemoglobin: 9.5 g/dL — ABNORMAL LOW (ref 12.0–15.0)
Immature Granulocytes: 0 %
Lymphocytes Relative: 14 %
Lymphs Abs: 1.8 10*3/uL (ref 0.7–4.0)
MCH: 26 pg (ref 26.0–34.0)
MCHC: 31.8 g/dL (ref 30.0–36.0)
MCV: 81.9 fL (ref 80.0–100.0)
Monocytes Absolute: 0.8 10*3/uL (ref 0.1–1.0)
Monocytes Relative: 6 %
Neutro Abs: 9.2 10*3/uL — ABNORMAL HIGH (ref 1.7–7.7)
Neutrophils Relative %: 71 %
Platelet Count: 431 10*3/uL — ABNORMAL HIGH (ref 150–400)
RBC: 3.65 MIL/uL — ABNORMAL LOW (ref 3.87–5.11)
RDW: 20.1 % — ABNORMAL HIGH (ref 11.5–15.5)
WBC Count: 13 10*3/uL — ABNORMAL HIGH (ref 4.0–10.5)
nRBC: 0 % (ref 0.0–0.2)

## 2021-03-22 LAB — CMP (CANCER CENTER ONLY)
ALT: 10 U/L (ref 0–44)
AST: 12 U/L — ABNORMAL LOW (ref 15–41)
Albumin: 3.2 g/dL — ABNORMAL LOW (ref 3.5–5.0)
Alkaline Phosphatase: 115 U/L (ref 38–126)
Anion gap: 9 (ref 5–15)
BUN: 11 mg/dL (ref 6–20)
CO2: 23 mmol/L (ref 22–32)
Calcium: 9.8 mg/dL (ref 8.9–10.3)
Chloride: 107 mmol/L (ref 98–111)
Creatinine: 0.83 mg/dL (ref 0.44–1.00)
GFR, Estimated: >60 mL/min (ref >60)
Glucose, Bld: 88 mg/dL (ref 70–99)
Potassium: 4.3 mmol/L (ref 3.5–5.1)
Sodium: 139 mmol/L (ref 135–145)
Total Bilirubin: <0.2 mg/dL — ABNORMAL LOW (ref 0.3–1.2)
Total Protein: 7.5 g/dL (ref 6.5–8.1)

## 2021-03-22 MED ORDER — ALTEPLASE 2 MG IJ SOLR
2.0000 mg | Freq: Once | INTRAMUSCULAR | Status: AC | PRN
  Administered 2021-03-22: 2 mg
  Filled 2021-03-22: qty 2

## 2021-03-22 MED ORDER — SODIUM CHLORIDE 0.9% FLUSH
10.0000 mL | INTRAVENOUS | Status: DC | PRN
  Administered 2021-03-22: 10 mL

## 2021-03-22 MED ORDER — TRASTUZUMAB-DKST CHEMO 150 MG IV SOLR
6.0000 mg/kg | Freq: Once | INTRAVENOUS | Status: AC
  Administered 2021-03-22: 273 mg via INTRAVENOUS
  Filled 2021-03-22: qty 13

## 2021-03-22 MED ORDER — DIPHENHYDRAMINE HCL 25 MG PO CAPS
25.0000 mg | ORAL_CAPSULE | Freq: Once | ORAL | Status: AC
  Administered 2021-03-22: 25 mg via ORAL
  Filled 2021-03-22: qty 1

## 2021-03-22 MED ORDER — SODIUM CHLORIDE 0.9 % IV SOLN: Freq: Once | INTRAVENOUS | Status: AC

## 2021-03-22 MED ORDER — HEPARIN SOD (PORK) LOCK FLUSH 100 UNIT/ML IV SOLN
500.0000 [IU] | Freq: Once | INTRAVENOUS | Status: AC | PRN
  Administered 2021-03-22: 500 [IU]

## 2021-03-22 MED ORDER — ACETAMINOPHEN 325 MG PO TABS
650.0000 mg | ORAL_TABLET | Freq: Once | ORAL | Status: AC
  Administered 2021-03-22: 650 mg via ORAL
  Filled 2021-03-22: qty 2

## 2021-03-22 NOTE — Op Note (Signed)
Name: Melissa [name]    MRN: [id number]   Date: 03/17/2021    DOB: 1966/02/09   STEREOTACTIC RADIOSURGERY OPERATIVE NOTE  PRE-OPERATIVE DIAGNOSIS:   metastatic breast cancer  POST-OPERATIVE DIAGNOSIS:  Same  PROCEDURE:  Stereotactic Radiosurgery, 3 lesions  SURGEON:  Elwin Sleight, DO  RADIATION ONCOLOGIST:  Dr. Tyler Pita, MD  TECHNIQUE:  The patient underwent a radiation treatment planning session in the radiation oncology simulation suite under the care of the radiation oncology physician and physicist.  I participated closely in the radiation treatment planning afterwards. The patient underwent planning CT which was fused to 3T high resolution MRI with 1 mm axial slices.  These images were fused on the planning system.  We contoured the gross target volumes and subsequently expanded this to yield the Planning Target Volume. I actively participated in the planning process.  I helped to define and review the target contours and also the contours of the optic pathway, eyes, brainstem and selected nearby organs at risk.  All the dose constraints for critical structures were reviewed and compared to AAPM Task Group 101.  The prescription dose conformity was reviewed.  I approved the plan electronically.    Accordingly, Melissa [name]  was brought to the TrueBeam stereotactic radiation treatment linac and placed in the custom immobilization mask.  The patient was aligned according to the IR fiducial markers with BrainLab Exactrac, then orthogonal x-rays were used in ExacTrac with the 6DOF robotic table and the shifts were made to align the patient. These were carefully aligned and repeated to confirm treatment position for each of the isocenters.  Melissa [name] received stereotactic radiosurgery to a prescription dose of 20Gy uneventfully to three target lesions in 1 fractions.    The detailed description of the procedure is recorded in the radiation oncology procedure note.  I was present for the  duration of the procedure.  DISPOSITION:   Following delivery, the patient was transported to nursing in stable condition and monitored for possible acute effects to be discharged to home in stable condition with follow-up in one month.  Elwin Sleight, [facility]

## 2021-03-22 NOTE — [facility] Notes (Signed)
[facility] Follow up:    Patient, [name] No address on file   DIAGNOSIS: Cancer Staging Breast cancer of upper-inner quadrant of left female breast ([place]) Staging form: Breast, AJCC 7th Edition - Clinical: Stage IIA (T2, N0, cM0) - Unsigned Histopathologic type: 9931 Laterality: Left Tumor size (mm): 13 Staging comments: Staged at breast conference 12.4.13  - Pathologic: Stage IIA (T1c, N1, cM0) - Unsigned Histopathologic type: 9931 Laterality: Left Tumor size (mm): 13   SUMMARY OF ONCOLOGIC HISTORY: Oncology History  Breast cancer of upper-inner quadrant of left female breast ([place])  06/08/2012 Initial Diagnosis   invasive ductal carcinoma that was ER positive PR positive HER-2/neu positive measuring 3.1 cm by MRI criteria. Ki-67 was 70% HER-2 was amplified with a ratio 2.91   07/12/2012 - 07/17/2013 Neo-Adjuvant Chemotherapy   TCH 6 followed by Herceptin maintenance   12/11/2012 Surgery   Left breast lumpectomy: 1.8 cm tumor 1 positive sentinel node, axillary lymph node dissection 02/08/2013 showed 0/13 lymph nodes   03/25/2013 - 05/06/2013 Radiation Therapy   Adjuvant radiation therapy   06/05/2013 - 07/20/2017 Anti-estrogen oral therapy   Tamoxifen 20 mg daily   07/27/2017 Relapse/Recurrence   MRI Brain: 3.4 x 2.9 x 2.9 cm RIGHT frontal lobe mass with imaging characteristics of solitary metastasis. Extensive vasogenic edema resulting in 9 mm RIGHT to LEFT midline shift. Equivocal very early LEFT ventricle entrapment.    08/04/2017 Surgery   Rt frontal brain resection: Poorly differentiated tumor IHC suggests breast primary ER and PR Positive   08/25/2017 - 09/04/2017 Radiation Therapy   Stereotactic radiation   09/18/2017 -  Anti-estrogen oral therapy   Lapatinib with letrozole   12/18/2017 - 12/19/2017 Radiation Therapy   New right parietal lobe metastases status post [facility]   08/27/2018 - 08/27/2020 Radiation Therapy   SRS to new brain  metastases   05/09/2019 Relapse/Recurrence   Interval increase in size of the enhancing nodular left internal mammary soft tissue 2.8 cm.  Redemonstrated enlarged supraclavicular, lower cervical and lower posterior cervical nodes unchanged.  Interval increase in the bony erosion of the posterior and lateral left third rib, increasing soft tissue lesion eroding the left sternal body 3.1 cm was 2.5 cm.  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 female returns for evaluation of her weight loss.  She is currently 95 pounds and notes she struggles with her appetite and weight gain.  She is drinking muscle milk.  She was concerned that her megace was causing her stomach pain so she stopped taking it.  She is also concerned that her lung issues are potentially contributing, and she is hoping to go to [facility] or [facility] for a second opinion in December. She does continue to smoke cigarettes daily.  Destenie continues on herceptin with good tolerance.  She has a dose due today. Her most recent scans showed no progressive disease in her body, however she had some new metastatic lesions in the brain that required radiation.  She is fatigued, and otherwise doing well.  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[place]) 06/10/2019   Mediastinal adenopathy    H/O coccidioidomycosis 05/16/2018   DOE (dyspnea on exertion) 05/03/2018   Diarrhea 04/22/2018   Cavitary lesion of lung in area of previous cyts in apex of Sup Segment of LLL 03/29/2018   Aortic atherosclerosis ([place]) 01/22/2018   Emphysema lung ([place]) 01/22/2018   CAP (community acquired pneumonia) 11/23/2017   Metastasis to brain ([place]) 08/04/2017   Brain metastasis ([place]) 07/27/2017   Nicotine abuse 07/27/2017   Migraines 07/27/2017   HCAP (healthcare-associated pneumonia) 11/26/2016   Upper airway cough syndrome 08/10/2016   Abnormal echocardiogram 08/07/2013   Chest tightness or pressure 08/07/2013   Hx of radiation therapy    Edema of left lower extremity 11/19/2012   Tachycardia 09/20/2012   Obstructive bronchiectasis ([place]) 01/26/2011   Cigarette smoker 01/26/2011    is allergic to aspirin, protonix [pantoprazole], and iodinated diagnostic agents.  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   Asthma    Breast cancer ([place])    Bronchiectasis ([place])    Bronchiolitis    Cancer ([place])    breast cancer 2014   Cancer, metastatic to liver ([place])    5427   Complication of anesthesia    bp dropped + desat    COPD (chronic obstructive pulmonary disease) (H[facility])    Dyspnea    DOE   GERD (gastroesophageal reflux disease)    H/O coccidioidomycosis    was reason for lung lobectomy   Headache(784.0)    due to eye strain or not eating   History of anemia    no current problem   History of asthma    as a child   History of breast cancer 2014   left   History of chemotherapy    finished 07/17/2013   History of hiatal hernia    AGE 37   Hx of radiation therapy 03/25/13-05/06/13   left breast 5000 cGy/25 sessions, left breast boost 1000 cGy/5 sessions   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    Pneumonia    LAST FLARE UP 01/2018   Rash 02/22/2021   Runny nose 07/30/2013   clear drainage   Wears dentures    upper    SURGICAL HISTORY: Past Surgical History:  Procedure Laterality Date   APPLICATION OF CRANIAL NAVIGATION N/A 08/04/2017   Procedure: APPLICATION OF CRANIAL NAVIGATION;  Surgeon: [name], [name], MD;  Location: [place];  Service: Neurosurgery;  Laterality: N/A;   AXILLARY LYMPH NODE DISSECTION Left 02/08/2013   Procedure: LEFT AXILLARY DISSECTION;  Surgeon: Edward Jolly, MD;  Location: [place];  Service: General;  Laterality: Left;   BREAST CYST EXCISION Right 2006   BREAST LUMPECTOMY Left 2014   BREAST LUMPECTOMY WITH NEEDLE LOCALIZATION AND AXILLARY SENTINEL LYMPH NODE BX Left 12/31/2012   Procedure: NEEDLE LOCALIZATION LEFT BREAST LUMPECTOMY AND LEFT AXILLARY SENTENIAL LYMPH NODE BX;  Surgeon: Edward Jolly, MD;  Location: [facility];  Service: General;  Laterality: Left;   BRONCHIAL BRUSHINGS  03/17/2020  Procedure: BRONCHIAL BRUSHINGS;  Surgeon: Garner [place], DO;  Location: [facility];  Service: Pulmonary;;   BRONCHIAL WASHINGS  03/17/2020   Procedure: BRONCHIAL WASHINGS;  Surgeon: Garner [place], DO;  Location: [place];  Service: Pulmonary;;   CESAREAN SECTION  1995/1996   CRANIOTOMY Right 08/04/2017   Procedure: Right Frontal craniotomy for resection of tumor with stereotactic navigation;  Surgeon: [name], [name], MD;  Location: [place];  Service: Neurosurgery;  Laterality: Right;  Right Frontal craniotomy for resection of tumor with stereotactic navigation   IR CV LINE INJECTION  02/19/2021   IR IMAGING GUIDED PORT INSERTION  05/31/2019   LUNG LOBECTOMY Left 05/1996   upper lobe - due to [facility] Fever   PORT-A-CATH REMOVAL Right 08/02/2013   Procedure: REMOVAL PORT-A-CATH;  Surgeon: Edward Jolly, MD;  Location: [place];  Service: General;  Laterality: Right;   PORTACATH PLACEMENT   07/02/2012   Procedure: INSERTION PORT-A-CATH;  Surgeon: Edward Jolly, MD;  Location: [place];  Service: General;  Laterality: N/A;  right   VIDEO BRONCHOSCOPY N/A 03/17/2020   Procedure: VIDEO BRONCHOSCOPY;  Surgeon: Garner [place], DO;  Location: [place];  Service: Pulmonary;  Laterality: N/A;   VIDEO BRONCHOSCOPY WITH ENDOBRONCHIAL ULTRASOUND N/A 05/23/2018   Procedure: VIDEO BRONCHOSCOPY WITH ENDOBRONCHIAL ULTRASOUND;  Surgeon: Garner [place], DO;  Location: [place];  Service: Thoracic;  Laterality: N/A;    SOCIAL HISTORY: Social History   Socioeconomic History   Marital status: Married    Spouse name: Not on file   Number of children: 2   Years of education: Not on file   Highest education level: Not on file  Occupational History   Occupation: Secondary school teacher  Tobacco Use   Smoking status: Every Day    Packs/day: 0.30    Years: 38.00    Pack years: 11.40    Types: Cigarettes   Smokeless tobacco: Never   Tobacco comments:    10 cigarettes smoked daily 10/19/20 [facility]   Vaping Use   Vaping Use: Never used  Substance and Sexual Activity   Alcohol use: No   Drug use: No   Sexual activity: Not Currently  Other Topics Concern   Not on file  Social History Narrative   Not on file   Social Determinants of Health   Financial Resource Strain: Not on file  Food Insecurity: Not on file  Transportation Needs: Not on file  Physical Activity: Not on file  Stress: Not on file  Social Connections: Not on file  Intimate Partner Violence: Not on file    FAMILY HISTORY: Family History  Problem Relation Age of Onset   Emphysema Mother        was a smoker   Heart disease Mother    Melanoma Mother        dx in her 53s   Breast cancer Mother 80   Asthma Brother    Breast cancer Cousin        mother's maternal cousin; dx in her 65s    Review of Systems  Constitutional:  Positive for appetite change, fatigue and unexpected weight change.  Negative for chills and diaphoresis.  HENT:   Negative for hearing loss, lump/mass and mouth sores.   Eyes:  Negative for eye problems and icterus.  Respiratory:  Positive for cough (occasional) and wheezing. Negative for chest tightness, hemoptysis and shortness of breath.   Cardiovascular:  Negative for chest pain, leg swelling and palpitations.  Gastrointestinal:  Negative for abdominal distention, abdominal pain, constipation, diarrhea, nausea and vomiting.  Endocrine: Negative for hot flashes.  Genitourinary:  Negative for difficulty urinating.   Musculoskeletal:  Negative for arthralgias.  Skin:  Negative for itching and rash.  Neurological:  Negative for dizziness, extremity weakness, headaches and numbness.  Hematological:  Negative for adenopathy. Does not bruise/bleed easily.  Psychiatric/Behavioral:  Negative for depression. The patient is not nervous/anxious.      PHYSICAL EXAMINATION  ECOG PERFORMANCE STATUS: 1 - Symptomatic but completely ambulatory  Vitals:   03/22/21 0913  BP: 114/65  Pulse: 78  Resp: 18  Temp: (!) 97.4 F (36.3 C)  SpO2: 100%    Physical Exam Constitutional:      General: She is not in acute distress.    Appearance: Normal appearance. She is not toxic-appearing.  HENT:     Head: Normocephalic and atraumatic.  Eyes:     General: No scleral icterus. Cardiovascular:     Rate and Rhythm: Normal rate and regular rhythm.     Pulses: Normal pulses.     Heart sounds: Normal heart sounds.  Pulmonary:     Effort: Pulmonary effort is normal.     Breath sounds: Normal breath sounds.  Abdominal:     General: Abdomen is flat. Bowel sounds are normal. There is no distension.     Palpations: Abdomen is soft.     Tenderness: There is no abdominal tenderness.  Musculoskeletal:        General: No swelling.     Cervical back: Neck supple.  Lymphadenopathy:     Cervical: No cervical adenopathy.  Skin:    General: Skin is warm and dry.     Findings:  No rash.  Neurological:     General: No focal deficit present.     Mental Status: She is alert.  Psychiatric:        Mood and Affect: Mood normal.        Behavior: Behavior normal.    LABORATORY DATA:  CBC    Component Value Date/Time   WBC 13.0 (H) 03/22/2021 0906   WBC 7.2 05/05/2020 0926   RBC 3.65 (L) 03/22/2021 0906   HGB 9.5 (L) 03/22/2021 0906   HGB 13.1 08/12/2014 0955   HCT 29.9 (L) 03/22/2021 0906   HCT 40.4 08/12/2014 0955   PLT 431 (H) 03/22/2021 0906   PLT 228 08/12/2014 0955   MCV 81.9 03/22/2021 0906   MCV 98.2 08/12/2014 0955   MCH 26.0 03/22/2021 0906   MCHC 31.8 03/22/2021 0906   RDW 20.1 (H) 03/22/2021 0906   RDW 13.0 08/12/2014 0955   LYMPHSABS 1.8 03/22/2021 0906   LYMPHSABS 2.0 08/12/2014 0955   MONOABS 0.8 03/22/2021 0906   MONOABS 0.5 08/12/2014 0955   EOSABS 1.0 (H) 03/22/2021 0906   EOSABS 0.2 08/12/2014 0955   BASOSABS 0.1 03/22/2021 0906   BASOSABS 0.0 08/12/2014 0955    CMP     Component Value Date/Time   NA 139 03/22/2021 0906   NA 142 08/12/2014 0956   K 4.3 03/22/2021 0906   K 4.6 08/12/2014 0956   CL 107 03/22/2021 0906   CL 100 12/20/2012 1509   CO2 23 03/22/2021 0906   CO2 26 08/12/2014 0956   GLUCOSE 88 03/22/2021 0906   GLUCOSE 94 08/12/2014 0956   GLUCOSE 98 12/20/2012 1509   BUN 11 03/22/2021 0906   BUN 10.1 08/12/2014 0956   CREATININE 0.83 03/22/2021 0906   CREATININE 0.88 03/11/2019 1057  CREATININE 0.9 08/12/2014 0956   CALCIUM 9.8 03/22/2021 0906   CALCIUM 9.3 08/12/2014 0956   PROT 7.5 03/22/2021 0906   PROT 7.1 08/12/2014 0956   ALBUMIN 3.2 (L) 03/22/2021 0906   ALBUMIN 3.8 08/12/2014 0956   AST 12 (L) 03/22/2021 0906   AST 15 08/12/2014 0956   ALT 10 03/22/2021 0906   ALT 11 08/12/2014 0956   ALKPHOS 115 03/22/2021 0906   ALKPHOS 68 08/12/2014 0956   BILITOT <0.2 (L) 03/22/2021 0906   BILITOT 0.25 08/12/2014 0956   GFRNONAA >60 03/22/2021 0906   GFRNONAA 76 03/11/2019 1057   GFRAA >60 03/24/2020  1012   GFRAA >60 02/11/2020 0836   GFRAA 88 03/11/2019 1057     ASSESSMENT and THERAPY PLAN:   Breast cancer of upper-inner quadrant of left female breast ([place]) Left breast invasive ductal carcinoma ER/PR positive HER-2 positive initially 3.1 cm, Ki-67 70%, HER-2 amplified ratio 2.91 status post neoadjuvant chemotherapy followed by surgery which showed 1.8 cm tumor 1 positive sentinel lymph node T1cN1 M0 stage IB status post radiation therapy and Herceptin maintenance and took tamoxifen 06/05/2013-08/11/2017   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  [place] brain: 08/25/2017-09/04/2017 2. Anti Her 2 therapy with Lapatinib started 09/17/2017-05/14/2019: Stopped for progression 3.  I discontinued tamoxifen and started her on letrozole 2.5 mg daily.  05/14/2019 stopped for progression 4.  Stereotactic radiosurgery 12/19/2017 to the new right parietal lobe metastases. 5.  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 Severe anemia: Asymptomatic we will watch.  Previously received blood transfusion.  Today's hemoglobin is 9 2. Chronic dry cough: I renewed a prescription for cough syrup 3.  Painful erythema of her hands: I discontinued Xeloda and Tukysa. 4.  Blisters on the lips: Resolved   Rash on the dorsum of the hands: Continuing to use moisturizers.  Tucatinib has been discontinued.  It has also resolved   Pustular lesions on the dorsum of the hand: Resolved  CT CAP 02/26/2021: Increase in bronchial dilation and cavitation with nodularity worsening in the left lung base.  Improvement in nodular areas in the left upper lobe.  No distant metastatic disease. Mammogram 02/23/2021: Benign breast density category C  Weight loss: I recommended that she restart the megace.  If she develops stomach pain again I recommend she cut the dose in half.  She will also meet with our nutritionist, Joli this afternoon.    In regards to her pulmonary concerns, I let her know that I will reach to my former colleague at [facility] to ask his opinion about who she could see that might have experience with her pulmonary issues.    Return to clinic every 3 weeks for Herceptin.   All questions were answered. The patient knows to call the  clinic with any problems, questions or concerns. We can certainly see the patient much sooner if necessary.  Total encounter time: 30 minutes in face to face visit time.  Wilber Bihari, NP 03/23/21 9:06 AM Medical Oncology and Hematology [facility] [place], [place] [address] Tel. [phone]    Fax. [phone]  *Total Encounter Time as defined by the Centers for Medicare and Medicaid Services includes, in addition to the face-to-face time of a patient visit (documented in the note above) non-face-to-face time: obtaining and reviewing outside history, ordering and reviewing medications, tests or  procedures, care coordination (communications with other health care professionals or caregivers) and documentation in the medical record.

## 2021-03-22 NOTE — [place] Notes (Signed)
Pt received cathflow by Baltazar Najjar RN.

## 2021-03-22 NOTE — Patient Instructions (Signed)
[facility] MEDICAL ONCOLOGY  Discharge Instructions: ?Thank you for choosing [facility] to provide your oncology and hematology care.  ? ?If you have a lab appointment with the Cancer Center, please go directly to the Cancer Center and check in at the registration area. ?  ?Wear comfortable clothing and clothing appropriate for easy access to any Portacath or PICC line.  ? ?We strive to give you quality time with your provider. You may need to reschedule your appointment if you arrive late (15 or more minutes).  Arriving late affects you and other patients whose appointments are after yours.  Also, if you miss three or more appointments without notifying the office, you may be dismissed from the clinic at the provider?s discretion.    ?  ?For prescription refill requests, have your pharmacy contact our office and allow 72 hours for refills to be completed.   ? ?Today you received the following chemotherapy and/or immunotherapy agents: Trastuzumab    ?  ?To help prevent nausea and vomiting after your treatment, we encourage you to take your nausea medication as directed. ? ?BELOW ARE SYMPTOMS THAT SHOULD BE REPORTED IMMEDIATELY: ?*FEVER GREATER THAN 100.4 F (38 ?C) OR HIGHER ?*CHILLS OR SWEATING ?*NAUSEA AND VOMITING THAT IS NOT CONTROLLED WITH YOUR NAUSEA MEDICATION ?*UNUSUAL SHORTNESS OF BREATH ?*UNUSUAL BRUISING OR BLEEDING ?*URINARY PROBLEMS (pain or burning when urinating, or frequent urination) ?*BOWEL PROBLEMS (unusual diarrhea, constipation, pain near the anus) ?TENDERNESS IN MOUTH AND THROAT WITH OR WITHOUT PRESENCE OF ULCERS (sore throat, sores in mouth, or a toothache) ?UNUSUAL RASH, SWELLING OR PAIN  ?UNUSUAL VAGINAL DISCHARGE OR ITCHING  ? ?Items with * indicate a potential emergency and should be followed up as soon as possible or go to the Emergency Department if any problems should occur. ? ?Please show the CHEMOTHERAPY ALERT CARD or IMMUNOTHERAPY ALERT CARD at check-in  to the Emergency Department and triage nurse. ? ?Should you have questions after your visit or need to cancel or reschedule your appointment, please contact [facility]  Dept: [phone]  and follow the prompts.  Office hours are 8:00 a.m. to 4:30 p.m. Monday - Friday. Please note that voicemails left after 4:00 p.m. may not be returned until the following business day.  We are closed weekends and major holidays. You have access to a nurse at all times for urgent questions. Please call the main number to the clinic Dept: [phone] and follow the prompts. ? ? ?For any non-urgent questions, you may also contact your provider using MyChart. We now offer e-Visits for anyone 18 and older to request care online for non-urgent symptoms. For details visit [email or web address]. ?  ?Also download the MyChart app! Go to the app store, search "MyChart", open the app, select [facility], and log in with your MyChart username and password. ? ?Due to Covid, a mask is required upon entering the hospital/clinic. If you do not have a mask, one will be given to you upon arrival. For doctor visits, patients may have 1 support person aged 18 or older with them. For treatment visits, patients cannot have anyone with them due to current Covid guidelines and our immunocompromised population.  ? ?

## 2021-03-22 NOTE — [place] Notes (Signed)
Nutrition  Called patient for scheduled telephone visit and no answer.  Left message with callback number.   Melissa [name] B. Zenia Resides, [place], [place] Registered Dietitian [phone] (mobile)

## 2021-03-22 NOTE — Telephone Encounter (Signed)
Received call from pt with complaint of weight loss, decrease in energy and GI upset x several weeks.  Pt states she is scheduled for tx today and would like to be seen by provider prior to tx.  Appt scheduled.

## 2021-03-22 NOTE — Addendum Note (Signed)
Encounter addended by: Karsten Ro, DO on: 03/22/2021 8:44 AM  Actions taken: Clinical Note Signed

## 2021-03-23 ENCOUNTER — Inpatient Hospital Stay: Payer: Medicare Other

## 2021-03-23 ENCOUNTER — Telehealth: Payer: Self-pay

## 2021-03-23 ENCOUNTER — Encounter: Payer: Self-pay | Admitting: Hematology and Oncology

## 2021-03-23 NOTE — Telephone Encounter (Signed)
[facility] Patient Advocate Encounter  Completed and sent [place] [employer] application for Voriconazole for this patient who is uninsured.    Patient is approved 03/22/21 through 03/22/22.  Medication will be shipped to patient house 3-5 business days.   Phone # [phone]   Ileene Patrick, [place] Patient [facility] Phone: [phone] Fax:  [phone]

## 2021-03-23 NOTE — Assessment & Plan Note (Signed)
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 Anti Her 2 therapy with Lapatinibstarted 09/17/2017-05/14/2019: Stopped for progression 3.I discontinuedtamoxifen and started her on letrozole 2.5 mg daily.05/14/2019 stopped for progression 4.Stereotactic radiosurgery 12/19/2017 to the new right parietal lobe metastases. 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 Previously received blood transfusion. Today's hemoglobin is  9 2.Chronic dry cough: I renewed a prescription for cough syrup 3.Painful erythema of her hands: I discontinued XelodaandTukysa. 4.Blisters on the lips: Resolved  Rash on the dorsum of the hands: Continuing to use moisturizers.Tucatinib has been discontinued.  It has also resolved  Pustular lesions on the dorsum of the hand: Resolved  CT CAP 02/26/2021: Increase in bronchial dilation and cavitation with nodularity worsening in the left lung base.  Improvement in nodular areas in the left upper lobe.  No distant metastatic disease. Mammogram 02/23/2021: Benign breast density category C  Weight loss: I recommended that she restart the megace.  If she develops stomach pain again I recommend she cut the dose in half.  She will also meet with our nutritionist, Joli this afternoon.    In regards to her pulmonary concerns, I let her know that I will reach to my former colleague at [facility] to ask his opinion about who she could see that might have experience with her pulmonary issues.    Return to clinic every 3 weeks for Herceptin.   

## 2021-03-23 NOTE — Progress Notes (Signed)
Nutrition Assessment   Reason for Assessment:  Request from NP regarding weight loss   ASSESSMENT:  55 year old female with metastatic breast cancer (liver, bone,brain, questionable lung).  Patient being treated for fungal infection of lung. Patient on herceptin.  RD was not able to reach patient yesterday afternoon.  Patient called RD back late in the afternoon.    RD called patient this am. Patient reports overall good appetite.  Has been trying to increase calories by drinking whole milk, eating ice cream at night.  Has started with diarrhea typically 1 time in the am and unsettled stomach in the morning.  Normal bowel movement is 1 every 3 days but now daily, diarrhea.  Unsure if it is related to increased dairy products that she has been eating to gain weight.  Says that after starting megace stomach started to hurt but has stopped and symptoms have continued.  No other new medication.  Typical day is cereal with whole milk or toast or bagel with cream cheese and coffee.  All depends on stomach as to what she is able to eat.  Lunch is left overs or sandwich (meat or cheese). Dinner is meat and vegetable and rice/pasta or potato.  Usually has ice cream for bedtime snack.     Medications: megace, prilosec   Labs: reviewed   Anthropometrics:   Height: 62 inches Weight: 95 lb 1.6 oz on 9/12 UBW: UBW 115-120 lb 112 lb on 05/14/2020 107 lb Jan 2022 BMI: 17 11% weight loss in the last 8 months  Estimated Energy Needs  Kcals: 1300-1500 Protein: 65-75 g Fluid: 1.3 L   NUTRITION DIAGNOSIS: Inadequate oral intake related to altered GI function, decreased intake, cancer as evidenced by 11% weight loss in the last 8 months, BMI 17, reduced intake   INTERVENTION:  Patient planning to switch to lactose free milk products to see if effects GI issues.  Discussed options.  Discussed oral nutrition supplement options orgain vegan shake, [employer] 1.4 will order samples Will mail recipes  for smoothies for patient to try. Encouraged patient to keep food diary and record number of calories eaten per day and compare to goal.  Reviewed ways to add calories (often higher fatty foods) which can make diarrhea worse.   Contact information sent   MONITORING, EVALUATION, GOAL: weight trends, intake   Next Visit: Monday Oct, 3rd in [place] during infusion  [place] B. Zenia Resides, [place], [place] Registered Dietitian [phone] (mobile)

## 2021-03-26 ENCOUNTER — Telehealth: Payer: Self-pay

## 2021-03-26 NOTE — Telephone Encounter (Signed)
Nutrition  Patient called and left RD message regarding ways to pay for [facility] 1.4 shake.  Really liked samples.   RD called patient back and RD has been in touch with sales rep.  Waiting on more information about possible home health agencies that maybe able to provide oral supplement at lower cost than buying directly from company.  Insurance will not cover.    Kamrin Sibley B. Zenia Resides, [place], [place] Registered Dietitian [phone] (mobile)

## 2021-03-29 ENCOUNTER — Telehealth: Payer: Self-pay

## 2021-03-29 NOTE — Telephone Encounter (Signed)
Nutrition  Follow-up phone call made to patient regarding options to purchase [employer] product at lower cost.    Melissa [name], [employer] [place] [employer], [place] Registered Dietitian [phone] (mobile)

## 2021-04-10 NOTE — Progress Notes (Signed)
Patient Care Team: Patient, [name] as PCP - General (General Practice) [name], MD as Referring Physician (Emergency Medicine)  DIAGNOSIS:    ICD-10-CM   1. Malignant neoplasm of upper-inner quadrant of left breast in female, estrogen receptor positive ([place])  C50.212    Z17.0       SUMMARY OF ONCOLOGIC HISTORY: Oncology History  Breast cancer of upper-inner quadrant of left female breast ([place])  06/08/2012 Initial Diagnosis   invasive ductal carcinoma that was ER positive PR positive HER-2/neu positive measuring 3.1 cm by MRI criteria. Ki-67 was 70% HER-2 was amplified with a ratio 2.91   07/12/2012 - 07/17/2013 Neo-Adjuvant Chemotherapy   TCH 6 followed by Herceptin maintenance   12/11/2012 Surgery   Left breast lumpectomy: 1.8 cm tumor 1 positive sentinel node, axillary lymph node dissection 02/08/2013 showed 0/13 lymph nodes   03/25/2013 - 05/06/2013 Radiation Therapy   Adjuvant radiation therapy   06/05/2013 - 07/20/2017 Anti-estrogen oral therapy   Tamoxifen 20 mg daily   07/27/2017 Relapse/Recurrence   MRI Brain: 3.4 x 2.9 x 2.9 cm RIGHT frontal lobe mass with imaging characteristics of solitary metastasis. Extensive vasogenic edema resulting in 9 mm RIGHT to LEFT midline shift. Equivocal very early LEFT ventricle entrapment.    08/04/2017 Surgery   Rt frontal brain resection: Poorly differentiated tumor IHC suggests breast primary ER and PR Positive   08/25/2017 - 09/04/2017 Radiation Therapy   Stereotactic radiation   09/18/2017 -  Anti-estrogen oral therapy   Lapatinib with letrozole   12/18/2017 - 12/19/2017 Radiation Therapy   New right parietal lobe metastases status post [facility]   08/27/2018 - 08/27/2020 Radiation Therapy   SRS to new brain metastases   05/09/2019 Relapse/Recurrence   Interval increase in size of the enhancing nodular left internal mammary soft tissue 2.8 cm.  Redemonstrated enlarged supraclavicular, lower cervical and lower  posterior cervical nodes unchanged.  Interval increase in the bony erosion of the posterior and lateral left third rib, increasing soft tissue lesion eroding the left sternal body 3.1 cm was 2.5 cm.  Bronchiectatic changes   06/19/2019 - 05/05/2020 Chemotherapy   ado-trastuzumab emtansine (KADCYLA)     06/15/2020 -  Chemotherapy   Xeloda, Tucatinib, Herceptin    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([place])  06/10/2019 Initial Diagnosis   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([place])   06/19/2019 - 05/05/2020 Chemotherapy   ado-trastuzumab emtansine (KADCYLA)     Port-A-Cath in place    CHIEF COMPLIANT: Cycle 15 Herceptin  INTERVAL HISTORY: Melissa [name] is a 55 y.o. with above-mentioned history of metastatic breast cancer with brain metastasis s/p resection who is currently on treatment with Xeloda and Herceptin. She reports to the clinic today for cycle 15.  Previously she was intolerant to Tucatinib and therefore we discontinued it.  She is here today to discuss what other treatment options she may have in addition to Herceptin.  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  She is very worried that the voriconazole is not controlling her disease.  ALLERGIES:  is allergic to aspirin, protonix [pantoprazole], and iodinated diagnostic agents.  MEDICATIONS:  Current Outpatient Medications  Medication Sig Dispense Refill   Neratinib Maleate (NERLYNX) 40 MG tablet Take 6 tablets (240 mg total) by mouth daily. Take with food. 180 tablet 3   albuterol (VENTOLIN HFA) 108 (90 Base) MCG/ACT inhaler Inhale 2 puffs into the lungs every 6 (six) hours as needed. 8  g 5   budesonide-formoterol (SYMBICORT) 160-4.5 MCG/ACT inhaler Inhale 2 puffs into the lungs 2 (two) times daily. 3 each 3   chlorpheniramine-HYDROcodone (TUSSIONEX) 10-8 MG/5ML SUER Take 5 mLs by mouth at bedtime. 115 mL 0   ELDERBERRY PO Take by mouth daily. Per patient, takes one daily      levalbuterol (XOPENEX) 0.63 MG/3ML nebulizer solution Take 3 mLs (0.63 mg total) by nebulization every 4 (four) hours as needed for wheezing or shortness of breath. 540 mL 6   lidocaine-prilocaine (EMLA) cream APPLY EXTERNALLY TO THE AFFECTED AREA 1 TIME 30 g 3   megestrol (MEGACE) 20 MG tablet Take 1 tablet (20 mg total) by mouth daily. 30 tablet 3   naproxen sodium (ALEVE) 220 MG tablet Take 440 mg by mouth 2 (two) times daily as needed (headache).      omeprazole (PRILOSEC) 40 MG capsule TAKE 1 CAPSULE(40 MG) BY MOUTH DAILY 30 capsule 9   voriconazole (VFEND) 200 MG tablet Take 1 tablet (200 mg total) by mouth 2 (two) times daily. 60 tablet 11   No current facility-administered medications for this visit.    PHYSICAL EXAMINATION: ECOG PERFORMANCE STATUS: 1 - Symptomatic but completely ambulatory  Vitals:   04/12/21 1012  BP: 115/63  Pulse: 81  Resp: 18  Temp: 97.8 F (36.6 C)  SpO2: 100%   Filed Weights   04/12/21 1012  Weight: 97 lb 11.2 oz (44.3 kg)    LABORATORY DATA:  I have reviewed the data as listed CMP Latest Ref Rng & Units 03/22/2021 03/01/2021 01/12/2021  Glucose 70 - 99 mg/dL 88 88 75  BUN 6 - 20 mg/dL _0 Creatinine 0.44 - 1.00 mg/dL 0.83 0.79 0.81  Sodium 135 - 145 mmol/L 139 139 140  Potassium 3.5 - 5.1 mmol/L 4.3 3.8 4.0  Chloride 98 - 111 mmol/L 107 105 106  CO2 22 - 32 mmol/L _1 Calcium 8.9 - 10.3 mg/dL 9.8 9.3 9.0  Total Protein 6.5 - 8.1 g/dL 7.5 6.9 7.6  Total Bilirubin 0.3 - 1.2 mg/dL <0.2(L) <0.2(L) <0.2(L)  Alkaline Phos 38 - 126 U/L 115 108 143(H)  AST 15 - 41 U/L 12(L) 11(L) 18  ALT 0 - 44 U/L _2 Lab Results  Component Value Date   WBC 12.8 (H) 04/12/2021   HGB 8.9 (L) 04/12/2021   HCT 27.7 (L) 04/12/2021   MCV 81.2 04/12/2021   PLT 414 (H) 04/12/2021   NEUTROABS 8.6 (H) 04/12/2021    ASSESSMENT & PLAN:  Breast cancer of upper-inner quadrant of left female breast ([place]) Left breast invasive ductal carcinoma ER/PR  positive HER-2 positive initially 3.1 cm, Ki-67 70%, HER-2 amplified ratio 2.91 status post neoadjuvant chemotherapy followed by surgery which showed 1.8 cm tumor 1 positive sentinel lymph node T1cN1 M0 stage IB status post radiation therapy and Herceptin maintenance and took tamoxifen 06/05/2013-08/11/2017   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  [place] brain: 08/25/2017-09/04/2017 2. Anti Her 2 therapy with Lapatinib started 09/17/2017-05/14/2019: Stopped for progression 3.  I discontinued tamoxifen and started her on letrozole 2.5 mg daily.  05/14/2019 stopped for progression 4.  Stereotactic radiosurgery 12/19/2017 to the new right parietal lobe metastases. 5.  Kadcyla: Received 16 cycles discontinued 05/05/2020 6.  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 Severe anemia: Asymptomatic we will watch.  Previously received blood transfusion.  Today's hemoglobin is 8.9    Rash on the dorsum of the hands: Continuing to use  moisturizers.  Tucatinib has been discontinued.  It has also resolved   Pustular lesions on the dorsum of the hand: Resolved   CT CAP 02/26/2021: Increase in bronchial dilation and cavitation with nodularity worsening in the left lung base.  Improvement in nodular areas in the left upper lobe.  No distant metastatic disease. Mammogram 02/23/2021: Benign breast density category C She wants to get a second opinion regarding her lung issues.   Return to clinic in 3 weeks for Herceptin and to meet with our pharmacy provider John to discuss drug interactions of neratinib as well as to see how she is tolerating it.   No orders of the defined types were placed in this encounter.  The patient has a good understanding of the overall plan. she agrees with it. she will call with any problems that may develop before the next visit here.  Total time spent: 30 mins including face to face time and time spent for planning, charting and coordination of care  Rulon Eisenmenger, MD, MPH 04/12/2021  I, Thana Ates, am acting as scribe for Dr. Nicholas Lose.  I have reviewed the above documentation for accuracy and completeness, and I agree with the above.

## 2021-04-12 ENCOUNTER — Other Ambulatory Visit: Payer: Self-pay

## 2021-04-12 ENCOUNTER — Other Ambulatory Visit (HOSPITAL_COMMUNITY): Payer: Self-pay

## 2021-04-12 ENCOUNTER — Ambulatory Visit: Payer: Medicare Other

## 2021-04-12 ENCOUNTER — Inpatient Hospital Stay (HOSPITAL_BASED_OUTPATIENT_CLINIC_OR_DEPARTMENT_OTHER): Payer: 59 | Admitting: Hematology and Oncology

## 2021-04-12 ENCOUNTER — Telehealth: Payer: Self-pay

## 2021-04-12 ENCOUNTER — Encounter: Payer: Self-pay | Admitting: Hematology and Oncology

## 2021-04-12 ENCOUNTER — Inpatient Hospital Stay: Payer: 59

## 2021-04-12 ENCOUNTER — Inpatient Hospital Stay: Payer: 59 | Attending: Medical

## 2021-04-12 ENCOUNTER — Other Ambulatory Visit: Payer: Self-pay | Admitting: Hematology and Oncology

## 2021-04-12 DIAGNOSIS — Z170 Estrogen receptor positive status [ER+]: Secondary | ICD-10-CM | POA: Insufficient documentation

## 2021-04-12 DIAGNOSIS — Z5112 Encounter for antineoplastic immunotherapy: Secondary | ICD-10-CM | POA: Insufficient documentation

## 2021-04-12 DIAGNOSIS — C787 Secondary malignant neoplasm of liver and intrahepatic bile duct: Secondary | ICD-10-CM | POA: Insufficient documentation

## 2021-04-12 DIAGNOSIS — C50212 Malignant neoplasm of upper-inner quadrant of left female breast: Secondary | ICD-10-CM

## 2021-04-12 DIAGNOSIS — C7951 Secondary malignant neoplasm of bone: Secondary | ICD-10-CM | POA: Diagnosis not present

## 2021-04-12 DIAGNOSIS — C7931 Secondary malignant neoplasm of brain: Secondary | ICD-10-CM | POA: Diagnosis not present

## 2021-04-12 DIAGNOSIS — C50912 Malignant neoplasm of unspecified site of left female breast: Secondary | ICD-10-CM

## 2021-04-12 DIAGNOSIS — Z171 Estrogen receptor negative status [ER-]: Secondary | ICD-10-CM

## 2021-04-12 LAB — CMP (CANCER CENTER ONLY)
ALT: 9 U/L (ref 0–44)
AST: 16 U/L (ref 15–41)
Albumin: 3.2 g/dL — ABNORMAL LOW (ref 3.5–5.0)
Alkaline Phosphatase: 110 U/L (ref 38–126)
Anion gap: 9 (ref 5–15)
BUN: 13 mg/dL (ref 6–20)
CO2: 25 mmol/L (ref 22–32)
Calcium: 9.4 mg/dL (ref 8.9–10.3)
Chloride: 107 mmol/L (ref 98–111)
Creatinine: 0.78 mg/dL (ref 0.44–1.00)
GFR, Estimated: >60 mL/min (ref >60)
Glucose, Bld: 77 mg/dL (ref 70–99)
Potassium: 4.1 mmol/L (ref 3.5–5.1)
Sodium: 141 mmol/L (ref 135–145)
Total Bilirubin: <0.2 mg/dL — ABNORMAL LOW (ref 0.3–1.2)
Total Protein: 7.2 g/dL (ref 6.5–8.1)

## 2021-04-12 LAB — CBC WITH DIFFERENTIAL (CANCER CENTER ONLY)
Abs Immature Granulocytes: 0.05 10*3/uL (ref 0.00–0.07)
Basophils Absolute: 0.1 10*3/uL (ref 0.0–0.1)
Basophils Relative: 1 %
Eosinophils Absolute: 1.4 10*3/uL — ABNORMAL HIGH (ref 0.0–0.5)
Eosinophils Relative: 11 %
HCT: 27.7 % — ABNORMAL LOW (ref 36.0–46.0)
Hemoglobin: 8.9 g/dL — ABNORMAL LOW (ref 12.0–15.0)
Immature Granulocytes: 0 %
Lymphocytes Relative: 15 %
Lymphs Abs: 1.9 10*3/uL (ref 0.7–4.0)
MCH: 26.1 pg (ref 26.0–34.0)
MCHC: 32.1 g/dL (ref 30.0–36.0)
MCV: 81.2 fL (ref 80.0–100.0)
Monocytes Absolute: 0.8 10*3/uL (ref 0.1–1.0)
Monocytes Relative: 7 %
Neutro Abs: 8.6 10*3/uL — ABNORMAL HIGH (ref 1.7–7.7)
Neutrophils Relative %: 66 %
Platelet Count: 414 10*3/uL — ABNORMAL HIGH (ref 150–400)
RBC: 3.41 MIL/uL — ABNORMAL LOW (ref 3.87–5.11)
RDW: 19.6 % — ABNORMAL HIGH (ref 11.5–15.5)
WBC Count: 12.8 10*3/uL — ABNORMAL HIGH (ref 4.0–10.5)
nRBC: 0 % (ref 0.0–0.2)

## 2021-04-12 MED ORDER — NERATINIB MALEATE 40 MG PO TABS
240.0000 mg | ORAL_TABLET | Freq: Every day | ORAL | 3 refills | Status: DC
  Filled 2021-04-12: qty 180, 30d supply, fill #0

## 2021-04-12 MED ORDER — DIPHENHYDRAMINE HCL 25 MG PO CAPS
ORAL_CAPSULE | ORAL | Status: AC
  Filled 2021-04-12: qty 1

## 2021-04-12 MED ORDER — ACETAMINOPHEN 325 MG PO TABS
650.0000 mg | ORAL_TABLET | Freq: Once | ORAL | Status: AC
  Administered 2021-04-12: 650 mg via ORAL

## 2021-04-12 MED ORDER — ACETAMINOPHEN 325 MG PO TABS
ORAL_TABLET | ORAL | Status: AC
  Filled 2021-04-12: qty 2

## 2021-04-12 MED ORDER — SODIUM CHLORIDE 0.9 % IV SOLN: Freq: Once | INTRAVENOUS | Status: AC

## 2021-04-12 MED ORDER — DIPHENHYDRAMINE HCL 25 MG PO CAPS
25.0000 mg | ORAL_CAPSULE | Freq: Once | ORAL | Status: AC
  Administered 2021-04-12: 25 mg via ORAL

## 2021-04-12 MED ORDER — HEPARIN SOD (PORK) LOCK FLUSH 100 UNIT/ML IV SOLN
500.0000 [IU] | Freq: Once | INTRAVENOUS | Status: AC | PRN
  Administered 2021-04-12: 500 [IU]

## 2021-04-12 MED ORDER — SODIUM CHLORIDE 0.9% FLUSH
10.0000 mL | INTRAVENOUS | Status: DC | PRN
  Administered 2021-04-12: 10 mL

## 2021-04-12 MED ORDER — TRASTUZUMAB-DKST CHEMO 150 MG IV SOLR
6.0000 mg/kg | Freq: Once | INTRAVENOUS | Status: AC
  Administered 2021-04-12: 273 mg via INTRAVENOUS
  Filled 2021-04-12: qty 13

## 2021-04-12 NOTE — Telephone Encounter (Signed)
Oral Oncology Patient Advocate Encounter   Received notification from [employer] that prior authorization for Nerlynx is required.   PA submitted on CoverMyMeds Key [id number] Status is pending   Oral Oncology Clinic will continue to follow.   [place] Patient [place] Phone [phone] Fax [phone] 04/12/2021 10:36 AM

## 2021-04-12 NOTE — Progress Notes (Signed)
Nutrition Follow-up:    Patient with metastatic breast cancer (liver, bone, brain, questionable lung).  Patient on herceptin.   Met with patient during infusion.  Drinking [employer] 1.4 shake during infusion.  Reports that she is drinking 1 a day but would like to drink 2 a day (not due to expense).  Reports that she switched to [employer] milk and mixing weight gainer powder and drinking as 2nd shake.  Eating lactose free ice cream and GI upset has improved.  Eating breakfast of toast, bagel or cereal.  Lunch is usually [place] sandwich with cheese and chips. Dinner is meat and vegetables    Medications: reviewed  Labs: reviewed  Anthropometrics:   Weight 97 lb increased 95 lb on 9/12 UBW of 115-120 lb   NUTRITION DIAGNOSIS: Inadequate oral intake improved   INTERVENTION:  Continue lactose free products as reducing GI issues Continue [employer] 1.4 shakes at least 1 a day Continue to consume high calorie and protein foods    MONITORING, EVALUATION, GOAL: Weight trends, intake   NEXT VISIT: Monday, October 24 during infusion  Cherilyn Sautter B. Zenia Resides, [place], [place] Registered Dietitian [phone] (mobile)

## 2021-04-12 NOTE — Telephone Encounter (Signed)
Oral Oncology Pharmacist Encounter  Received new prescription for neratinib (Nerlynx) for the treatment of metastatic breast cancer in conjunction with herceptin, planned duration until disease progression or unacceptable toxicity.  Patient is unable to take neratinib in combination with xeloda due to hand-foot syndrome (discontinued 12/15/20).   Prescription dose and frequency assessed, will discuss with MD dose escalation as prescription currently sent in for full dose at 240mg  daily.   Labs from 04/12/2021 assessed, no interventions needed.  Current medication list in Epic reviewed, DDIs with neratinib identified: - Voriconazole (cat x) increases the concentration of neratinib and should be avoided (can increase by 3.2-fold). I have reached out to the [facility] team as well since patient follows with them for the aspergillus.  - Omeprazole (cat x) which may decrease the concentration of neratinib and should be avoided with an PPIs. Will discuss current interactions with MD.    Evaluated chart and no patient barriers to medication adherence noted.   Patient agreement for treatment documented in MD note on 04/12/2021.  Prescription has been e-scribed to the [facility] for benefits analysis and approval.  Oral Oncology Clinic will continue to follow for insurance authorization, copayment issues, initial counseling and start date.  Drema Halon, PharmD Hematology/Oncology Clinical Pharmacist [facility] [phone] 04/12/2021 10:38 AM

## 2021-04-12 NOTE — Assessment & Plan Note (Signed)
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 Anti Her 2 therapy with Lapatinibstarted 09/17/2017-05/14/2019: Stopped for progression 3.I discontinuedtamoxifen and started her on letrozole 2.5 mg daily.05/14/2019 stopped for progression 4.Stereotactic radiosurgery 12/19/2017 to the new right parietal lobe metastases. 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 Previously received blood transfusion. Today's hemoglobin is  9 2.Chronic dry cough: I renewed a prescription for cough syrup 3.Painful erythema of her hands: I discontinued XelodaandTukysa. 4.Blisters on the lips: Resolved  Rash on the dorsum of the hands: Continuing to use moisturizers.Tucatinib has been discontinued.It has also resolved  Pustular lesions on the dorsum of the hand:Resolved  CT CAP 02/26/2021: Increase in bronchial dilation and cavitation with nodularity worsening in the left lung base.  Improvement in nodular areas in the left upper lobe.  No distant metastatic disease. Mammogram 02/23/2021: Benign breast density category C  Weight loss: I recommended that she restart the megace

## 2021-04-12 NOTE — Patient Instructions (Addendum)
[facility]  Discharge Instructions: Thank you for choosing [facility] to provide your oncology and hematology care.   If you have a lab appointment with the [facility], please go directly to the [facility] and check in at the registration area.   Wear comfortable clothing and clothing appropriate for easy access to any Portacath or PICC line.   We strive to give you quality time with your provider. You may need to reschedule your appointment if you arrive late (15 or more minutes).  Arriving late affects you and other patients whose appointments are after yours.  Also, if you miss three or more appointments without notifying the office, you may be dismissed from the clinic at the provider's discretion.      For prescription refill requests, have your pharmacy contact our office and allow 72 hours for refills to be completed.    Today you received the following chemotherapy and/or immunotherapy agent: Trastuzumab-dkst (OGIVRI)   To help prevent nausea and vomiting after your treatment, we encourage you to take your nausea medication as directed.  BELOW ARE SYMPTOMS THAT SHOULD BE REPORTED IMMEDIATELY: *FEVER GREATER THAN 100.4 F (38 C) OR HIGHER *CHILLS OR SWEATING *NAUSEA AND VOMITING THAT IS NOT CONTROLLED WITH YOUR NAUSEA MEDICATION *UNUSUAL SHORTNESS OF BREATH *UNUSUAL BRUISING OR BLEEDING *URINARY PROBLEMS (pain or burning when urinating, or frequent urination) *BOWEL PROBLEMS (unusual diarrhea, constipation, pain near the anus) TENDERNESS IN MOUTH AND THROAT WITH OR WITHOUT PRESENCE OF ULCERS (sore throat, sores in mouth, or a toothache) UNUSUAL RASH, SWELLING OR PAIN  UNUSUAL VAGINAL DISCHARGE OR ITCHING   Items with * indicate a potential emergency and should be followed up as soon as possible or go to the Emergency Department if any problems should occur.  Please show the CHEMOTHERAPY ALERT CARD or IMMUNOTHERAPY ALERT CARD at  check-in to the Emergency Department and triage nurse.  Should you have questions after your visit or need to cancel or reschedule your appointment, please contact [facility]  Dept: [phone]  and follow the prompts.  Office hours are 8:00 a.m. to 4:30 p.m. Monday - Friday. Please note that voicemails left after 4:00 p.m. may not be returned until the following business day.  We are closed weekends and major holidays. You have access to a nurse at all times for urgent questions. Please call the main number to the clinic Dept: [phone] and follow the prompts.   For any non-urgent questions, you may also contact your provider using MyChart. We now offer e-Visits for anyone 60 and older to request care online for non-urgent symptoms. For details visit [email or web address].   Also download the MyChart app! Go to the app store, search "MyChart", open the app, select [facility], and log in with your MyChart username and password.  Due to Covid, a mask is required upon entering the hospital/clinic. If you do not have a mask, one will be given to you upon arrival. For doctor visits, patients may have 1 support person aged 84 or older with them. For treatment visits, patients cannot have anyone with them due to current Covid guidelines and our immunocompromised population.

## 2021-04-12 NOTE — Patient Instructions (Signed)

## 2021-04-16 ENCOUNTER — Other Ambulatory Visit (HOSPITAL_COMMUNITY): Payer: Self-pay

## 2021-04-19 ENCOUNTER — Other Ambulatory Visit: Payer: Self-pay | Admitting: Adult Health

## 2021-04-19 DIAGNOSIS — J984 Other disorders of lung: Secondary | ICD-10-CM

## 2021-04-19 NOTE — Progress Notes (Signed)
Patient requests second opinion for cavitary lung lesion and treatment, feels infection is worsening.

## 2021-04-20 ENCOUNTER — Encounter: Payer: Self-pay | Admitting: *Deleted

## 2021-04-20 ENCOUNTER — Telehealth: Payer: Self-pay | Admitting: *Deleted

## 2021-04-20 ENCOUNTER — Other Ambulatory Visit: Payer: Self-pay | Admitting: Hematology and Oncology

## 2021-04-20 DIAGNOSIS — C50912 Malignant neoplasm of unspecified site of left female breast: Secondary | ICD-10-CM

## 2021-04-20 DIAGNOSIS — C50212 Malignant neoplasm of upper-inner quadrant of left female breast: Secondary | ICD-10-CM

## 2021-04-20 MED ORDER — HYDROCOD POLST-CPM POLST ER 10-8 MG/5ML PO SUER: 5.0000 mL | Freq: Every day | ORAL | 0 refills | Status: DC

## 2021-04-20 NOTE — Telephone Encounter (Signed)
Per request of Wilber Bihari, NP RN successfully faxed referral to Dr. Lillette Boxer with [facility] [phone])

## 2021-04-20 NOTE — Telephone Encounter (Signed)
Returned patient's phone call, spoke with patient 

## 2021-04-21 ENCOUNTER — Encounter: Payer: Self-pay | Admitting: Urology

## 2021-04-21 MED ORDER — NERATINIB MALEATE 40 MG PO TABS: 240.0000 mg | ORAL_TABLET | Freq: Every day | ORAL | 3 refills | Status: DC

## 2021-04-21 NOTE — Progress Notes (Signed)
Patient states doing well. No symptoms reported at this time.   Meaningful use complete.  Patient notified of 8:30am-04/22/21 telephone appointment and verbalized understanding.

## 2021-04-22 ENCOUNTER — Ambulatory Visit
Admission: RE | Admit: 2021-04-22 | Discharge: 2021-04-22 | Disposition: A | Discharge status: Home / Self Care | Payer: Medicare Other | Source: Ambulatory Visit | Attending: Urology | Admitting: Urology

## 2021-04-22 ENCOUNTER — Other Ambulatory Visit (HOSPITAL_COMMUNITY): Payer: Self-pay

## 2021-04-22 DIAGNOSIS — C7931 Secondary malignant neoplasm of brain: Secondary | ICD-10-CM

## 2021-04-22 NOTE — Progress Notes (Signed)
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    ICD-10-CM   1. Brain metastasis ([place])  C79.31      Interval Since Last Radiation:  1 month  03/17/21: These three targets were treated to a prescription dose of 20 G in a single fraction.   08/27/20: These five targets were treated to a prescription dose of 20 G in a single fraction.    05/13/20:  SRS brain to the following 4 targets treated to a prescription dose of 20 Gy in a single fraction with a single isocenter:  PTV6 Mid Cerebellum 64m PTV7 Lt Occipital 466mPTV8 Lt Occipital 6m53mTV9 Lt Temporal 6mm78m9/14/2020:  SRS brain//PTV 5:  Right frontal 5 mm target was treated to a prescription dose of 20 Gy in a single fraction.   09/04/2018:   SRS Brain// Right Frontal, 2 targets / 20 Gy in 1 fraction PTV3: Ant Rt Frontal 11mm106my PTV4: Rt Frontal resection cavity 5mm  16my  12/18/2017: SRS brain//PTV2: 4 mm Rt Parietal lesion treated to 20 Gy in 1 Fx  08/25/2017, 08/28/2017, 08/30/2017, 09/01/2017, 09/04/2017: PTV1: post op SRS to right frontal lobe resection cavity in 5 fxs  Narrative:  In summary, she initially presented to the emergency Department on 07/27/2017 with complaints of headaches ongoing for approximately 5 weeks with associated nausea and vomiting. She was initially treated by her PCP for sinusitis without improvement.  She also has a history of migraine headaches and had ben evaluated in the ED at [facility]2017 for treatment of what she thought was a typical migraine headache given the fact that she had her usual blurry vision and aura preceding the headache. She reports that the  headache did improve with treatment but the relief was short-lived as the headache returned the very next day. She followed up with her primary care physician and a CT of the head was going to be scheduled due to the persistent headaches but in the interim, [facility] increased severity of the headache with associated nausea and vomiting. The headaches were occuring in the frontal lobe as well as bilateral temporal with occasional radiation to the occipital lobe and associated with blurry vision, decreased appetite, fatigue, nausea and vomiting.  She denied any difficulty with speech, memory, imbalance or focal weakness.   CT Head on admission 07/27/2017 showed a large right frontal lobe mass measuring 3.1 by 2.5 cm with significant surrounding edema with mass effect and right to left midline shift measuring 8 mm. A subsequent Brain MRI showed a 3.4 x 2.9 x 2.9 cm right frontal lobe mass with imaging characteristics of solitary metastasis and extensive vasogenic edema resulting in 9 mm right to left midline shift as well as equivocal very early left ventricle entrapment.     CT Chest, Abdomen, Pelvis on 07/28/2017 for disease staging showed no findings to suggest metastatic breast cancer involving the chest, abdomen, pelvis or osseous structures. 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 Surgical changes involving the left breast and left axilla but no  findings for local recurrence or regional adenopathy.  She proceeded with a gross total resection of the frontal lesion on 08/05/17 with Dr. Cyndy Freeze followed by adjuvant fractionated postop SRS radiotherapy to the resection cavity in February 2019. Final surgical pathology revealed poorly differentiated adenocarcinoma consistent with metastatic breast cancer, ER/PR positive and HER-2 positive.   She tolerated SRS treatment well and initial post treatment MRI brain on  12/07/17 showed satisfactory appearance of the right anterior frontal lobe post treatment site with expected evolution but unfortunately also showed a single, new 2 - 3 mm lesion in the right parietal lobe without associated edema or mass-effect.  She elected to undergo salvage SRS treatment to the new lesion in the parietal lobe which was completed on 12/18/17 and tolerated well.  A follow up brain MRI on 05/18/18 showed a stable right frontal resection cavity with stable 4 mm nodular enhancement at the inferior cavity margin but no evidence of new disease.  Unfortunately, the follow up MRI brain scan on 08/23/2018 showed a new 11 mm inferior right frontal lesion with only mild associated edema and no mass-effect.  She reported that she had been having more frequent frontal headaches which were not severe and responded to Aleve and otherwise, had been feeling well.  She elected to proceed with a single fraction of SRS to this new brain metastasis as recommended and this was completed on  09/04/18 and tolerated very well.   She had bronchoscopy with Dr. Valeta Harms in 05/2018 and dx'ed with Aspergillus- ID following (Dr. Graylon Good).  She also continues in routine follow up with Dr. Melvyn Novas in Pulmonology.  Follow up MRI brain scan from 03/08/19 showed continued interval enlargement of the nodular focus with enhancement at the superior margin of the right frontal resection cavity, measuring 5 x 6 mm in diameter compared with 3 x 4.5 mm on the previous study. Two smaller foci of enhancement along the inferior margin were unchanged and there was no evidence of increasing mass effect or edema and no new lesions seen elsewhere within the brain.  She elected to proceed with SRS treatment of the progressive nodular lesion which was completed on 03/25/19 and tolerated well without any acute ill effects.    She developed systemic disease progression, particularly in the chest with increase in size of enhancing nodular left internal  mammary soft tissue mass, lymph nodes and left third rib lesion and soft tissue lesion eroding the left sternal body as well as a new, hypermetabolic 6.5 cm liver lesion seen on PET scan from 05/23/2019 despite continuing on lapatinib with Letrozole. She did have an ultrasound-guided biopsy of the liver mass on 06/05/2019 which confirmed metastatic breast cancer, HER-2 positive, ER/PR negative. Her systemic therapy was changed to Radisson beginning on 06/19/2019, under the care and direction of Dr. Lindi Adie which she tolerated very well.  A follow-up CT chest from 07/19/2019 showed a positive response to treatment with decrease in size of the soft tissue mass in the breast as well as bony lesions and adenopathy.  Fortunately, her follow-up MRI brain from 08/02/2019 showed stability of the previously treated disease and no new or progressive findings. A repeat MRI brain scan 11/04/19 was felt to be overall stable but there was a slight change in a previously treated right inferior frontal lesion, measuring 5 mm, previously 4 mm, with a new, separate punctate focus of enhancement just posterior to that. This was such minimal change and when her prior treatment fields were fused with recent scan,  it appeared that the new area of enhancement was on the field border of GTV3 and would have received a large portion of the delivered dose.  Therefore, consensus recommendation was to simply monitor this area with routine 3 month follow up brain imaging. She had repeat MRI on 01/30/20 and this showed overall disease stability with an unchanged appearance of the 2 previously treated right frontal lobe deposits and no new or progressive disease. Repeat systemic disease staging scans were performed on 01/29/20 and 02/04/20.  The bone scan on 01/29/20 showed less uptake of tracer at the previously identified sternal and LEFT third rib lesions, consistent with response to therapy and there were no new sites of osseous metastasis noted. The CT  C/A/P performed on 02/04/20 also showed disease stability with a new cluster of spiculated nodule or opacities in the posterior left lung favoring aspergillosis or other atypical infectious etiology.  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  There was no definite evidence of metastatic disease within the abdomen or pelvis. She continued on Kadcycla, s/p 14 cycles as of 05/05/20.   Repeat MRI brain from 05/01/20 showed 4 new metastatic lesions, all subcentimeter with a 27m midline cerebellar vermian metastasis, 470mleft occipital metastasis, 85m19might occipital metastasis and a punctate lesion in the left temporal lobe.  The concensus recommendation was to proceed with a single fraction of SRS to the 4 new metastases which she was in agreement with and was completed on 05/13/21.  Her restaging systemic imaging from 05/28/20 was without evidence of any disease progression or recurrence in the chest, abdomen or pelvis.  At the time of her follow up with Dr. GudLindi Adie 05/05/2020, the decision was made to discontinue the Kadcycla, due to disease progression in the brain, and switch to Tucatinib, oral systemic therapy in combination with Herceptin and Xeloda which was started 06/11/20.  Her follow up brain MRI from 08/13/20 unfortunately showed disease progression with 5 new lesions: with a 2 mm left frontal lesion, 2 mm right frontal lesion, 2 mm right occipital lesion, 2 mm right temporal lesion and a 5 mm left temporal lesion.  The 1 mm right occipital lesion was initially thought to be new but on further review of prior treatment plans, it was noted to have been treated previously and stable.  Otherwise, the other previously treated lesions were improved and/or stable.  The consensus recommendation at brain conference was to proceed with a single fraction of SRS  to the 5 new brain lesions which she was in agreement with and was completed on 08/27/20.  She tolerated the treatment very well without any ill side effects aside from some low grade nausea for the first 48 hours.   In May 2022, her surveillance MRI showed no new lesions.  However, her recent follow up brain MRI on 03/05/21 showed 2 new brain mets and a 3rd enlarging brain met which has not previously been treated. She has been on hold for Tucatinib due to cutaneous reactions but fortunately, her restaging systemic imaging with CT C/A/P from 02/26/21 was without any evidence of new or progressive disease in the chest and no evidence of metastatic disease involving the abdomen or pelvis. We met with her on 03/11/21 to review these findings and discuss the consensus recommendation from brain conference to proceed with a single fraction of SRS to  the three new brain lesions which she was in agreement with and was completed on 03/17/21.      Interval History:   I spoke with the patient to conduct her routine scheduled 1 month post-treatment follow up visit via telephone to spare the patient unnecessary potential exposure in the healthcare setting during the current COVID-19 pandemic.  The patient was notified in advance and gave permission to proceed with this visit format.  She tolerated the treatment well and continues without complaints.  She is currently awaiting insurance approval for a new oral targeted therapy with neratinib (Nerlynx) in conjunction with herceptin and anticipates starting this medication in the near future.    On review of systems, the patient states that she is doing well overall.  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  She is without complaints aside from fatigue and chronic cough.  She has been referred to Dr. 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  She has continued with improved energy levels and has been able to continue to work and has recently moved her parents to a retirement facility. She denies headaches, decreased visual or auditory acuity, tinnitus, tremor, focal weakness or seizure activity. She is not having difficulty with speech or word finding.  She denies abdominal pain, N/V or diarrhea. She reports a healthy appetite and is maintaining her weight which she is pleased with.                ALLERGIES:  is allergic to aspirin, protonix [pantoprazole], and iodinated diagnostic agents.  Meds: Current Outpatient Medications  Medication Sig Dispense Refill   albuterol (VENTOLIN HFA) 108 (90 Base) MCG/ACT inhaler Inhale 2 puffs into the lungs every 6 (six) hours as needed. 8 g 5   budesonide-formoterol (SYMBICORT) 160-4.5 MCG/ACT inhaler Inhale 2 puffs into the lungs 2 (two) times daily. 3 each 3   chlorpheniramine-HYDROcodone (TUSSIONEX) 10-8 MG/5ML SUER Take 5 mLs by mouth at bedtime. 115 mL 0   ELDERBERRY PO Take by mouth daily. Per patient, takes one daily     levalbuterol (XOPENEX) 0.63 MG/3ML nebulizer solution Take 3 mLs (0.63 mg total) by nebulization every 4 (four) hours as needed for wheezing or shortness of breath. 540 mL 6   lidocaine-prilocaine (EMLA) cream APPLY EXTERNALLY TO THE AFFECTED AREA 1 TIME 30 g 3   megestrol (MEGACE) 20 MG tablet Take 1 tablet (20 mg total) by mouth daily. 30 tablet 3   naproxen sodium (ALEVE) 220 MG tablet Take 440 mg by mouth 2 (two) times daily as needed (headache).      Neratinib Maleate (NERLYNX) 40 MG tablet Take 6 tablets (240 mg total) by mouth daily. Take with food. 180 tablet 3   omeprazole (PRILOSEC) 40 MG capsule TAKE 1 CAPSULE(40 MG) BY MOUTH DAILY 30 capsule 9   voriconazole (VFEND) 200 MG tablet Take 1 tablet (200 mg total) by mouth 2 (two) times daily. 60 tablet 11   No current facility-administered medications for this encounter.    Physical  Findings: Unable to assess due to telephone follow up visit format.  Lab Findings: Lab Results  Component Value Date   WBC 12.8 (H) 04/12/2021   WBC 7.2 05/05/2020   HGB 8.9 (L) 04/12/2021   HGB 13.1 08/12/2014   HCT 27.7 (L) 04/12/2021   HCT 40.4 08/12/2014   PLT 414 (  H) 04/12/2021   PLT 228 08/12/2014    Lab Results  Component Value Date   NA 141 04/12/2021   NA 142 08/12/2014   K 4.1 04/12/2021   K 4.6 08/12/2014   CHLORIDE 107 08/12/2014   CO2 25 04/12/2021   CO2 26 08/12/2014   GLUCOSE 77 04/12/2021   GLUCOSE 94 08/12/2014   GLUCOSE 98 12/20/2012   BUN 13 04/12/2021   BUN 10.1 08/12/2014   CREATININE 0.78 04/12/2021   CREATININE 0.88 03/11/2019   CREATININE 0.9 08/12/2014   BILITOT <0.2 (L) 04/12/2021   BILITOT 0.25 08/12/2014   ALKPHOS 110 04/12/2021   ALKPHOS 68 08/12/2014   AST 16 04/12/2021   AST 15 08/12/2014   ALT 9 04/12/2021   ALT 11 08/12/2014   PROT 7.2 04/12/2021   PROT 7.1 08/12/2014   ALBUMIN 3.2 (L) 04/12/2021   ALBUMIN 3.8 08/12/2014   CALCIUM 9.4 04/12/2021   CALCIUM 9.3 08/12/2014   ANIONGAP 9 04/12/2021    Radiographic Findings: No results found.  Impression/Plan: 72.         55 y.o. 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 She appears to have recovered well form the effects of radiotherapy and is currently without complaints.  We discussed the plan to obtain a post-treatment brain MRI in 06/2021 and pending this is stable, we will resume surveillance imaging every 3 months going forward.  I will contact her by phone following each scan to review results and recommendations from brain tumor board but she knows that she is welcome to call at anytime in the interim with any questions or concerns.  She will also continue in routine follow-up under the care and direction of Dr. Lindi Adie for continued management of her systemic disease.  Her next scheduled visit in  medical oncology is on 05/03/21.   Given current concerns for patient exposure during the COVID-19 pandemic, this encounter was conducted via telephone. The patient was notified in advance and was offered a [facility] meeting to allow for face to face communication but unfortunately reported that she did not have the appropriate resources/technology to support such a visit and instead preferred to proceed with telephone follow up visit. The patient has given verbal consent for this type of encounter. The time spent during this encounter was 15 minutes with 50% of time spent in coordination of care. The attendants for this meeting include Latoyna Hird PA-C, and patient, Miciah Shealy. During the encounter, Eleonor Ocon PA-C was located at [facility] Radiation Oncology Department.  Patient, Othel Dicostanzo was located at home.   Nicholos Johns, PA-C     Tyler Pita, MD  [facility] Direct Dial: [phone]  Fax: [phone] [email or web address]  Skype  LinkedIn

## 2021-04-22 NOTE — Telephone Encounter (Signed)
Oral Chemotherapy Pharmacist Encounter  I spoke with patient for overview of: Nerlynx (neratinib) for the maintenance adjuvant treatment of Her-2 receptor positive, hormone receptor-positive breast cancer, in combination with Herceptin, planned duration one year.  Counseled patient on administration, dosing, side effects, monitoring, drug-food interactions, safe handling, storage, and disposal.  Nerlynx will be initiated on a dose titration schedule. Antidiarrheal prophylaxis will not be used.   Patient will use loperamide as needed if symptoms of diarrhea occur.   Week 1: Patient will take Nerlynx $RemoveBefo'40mg'cahXeHUHjpI$  tablets, 3 tablets ($RemoveBe'120mg'nexaPpQOA$ ) by mouth once daily with food Week 2: Patient will take Nerlynx $RemoveBefo'40mg'kwxUrSuROAP$  tablets, 4 tablets ($RemoveBe'160mg'UUHAyLOmM$ ) by mouth once daily with food Week 3: Patient will take Nerlynx $RemoveBefo'40mg'objgQbfyYzV$  tablets, 5 tablets ($RemoveBe'200mg'sXZRpnmFg$ ) by mouth once daily with food 200 mg once daily is the target dose of Nerlynx for Melissa [name].  Patient knows to avoid grapefruit and grapefruit juice while on treatment with Nerlynx.  Patient instructed to avoid use of PPIs or H2RAs while on treatment with Nerlynx, can separate antacids from Nerlynx by 3 hours if acid suppression is needed. Patient is agreeable to stopping the omeprazole due to the interaction and states she doesn't really need it as she has figured out what she can eat or not. Patient stated if she needed anything later in the day she would take an antacid and was notified to wait 3 hours in between taking the nerlynx.   Patient is currently on omeprazole, per MD, okay to discontinue medication. 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 This can increase the concentration of the neratinib. Will monitor patient closely in addition to having the target dose at $Remov'200mg'ClRXdw$  instead of $RemoveBe'240mg'wGGoerazc$ .   Nerlynx start date: 04/26/2021 Patient will receive shipment of the medication this weekend but is out of town until Sunday night.   Adverse effects  include but are not limited to: diarrhea, nausea, vomiting, mouth sores, fatigue, rash, and abdominal pain.    Patient instructed to increase or decrease loperamide dose to maintain 1-2 bowel movements per day.  Reviewed with patient importance of keeping a medication schedule and plan for any missed doses. No barriers to medication adherence identified.  Medication reconciliation performed and medication/allergy list updated.  Insurance authorization for Nerlynx has been obtained. Patient has to fill the medication through [facility] and prescription has been sent.   Patient informed that she will need to contact the pharmacy 5-7 days prior to needing next fill of Nerlynx to coordinate continued medication acquisition to prevent break in therapy.  All questions answered.  Mrs. Kirkes voiced understanding and appreciation.   Medication education handout placed in mail for patient. Patient knows to call the office with questions or concerns. Oral Chemotherapy Clinic phone number provided to patient.   Drema Halon, PharmD Hematology/Oncology Clinical Pharmacist [facility] [phone] 04/22/2021   9:13 AM

## 2021-04-22 NOTE — Telephone Encounter (Signed)
Oral Oncology Patient Advocate Encounter  Prior Authorization for Nerlynx has been approved.    PA# [id number] Effective dates: 04/16/21 through 07/10/21  Patient must fil at [facility] will continue to follow.

## 2021-04-23 LAB — MYCOBACTERIA,CULT W/FLUOROCHROME SMEAR
MICRO NUMBER:: 12313699
SMEAR:: NONE SEEN
SPECIMEN QUALITY:: ADEQUATE

## 2021-04-23 LAB — FUNGUS CULTURE W SMEAR
MICRO NUMBER:: 12313698
SPECIMEN QUALITY:: ADEQUATE

## 2021-04-26 ENCOUNTER — Encounter: Payer: Self-pay | Admitting: Hematology and Oncology

## 2021-04-26 ENCOUNTER — Other Ambulatory Visit (HOSPITAL_COMMUNITY): Payer: Self-pay

## 2021-04-27 NOTE — Progress Notes (Signed)
Patient now has [employer].  Ogivri orders changed to [place] per insurance preferred biosimilar.  Raul Del [place], [place], BCPS, BCOP 04/27/2021 12:38 PM

## 2021-04-28 ENCOUNTER — Encounter: Payer: Self-pay | Admitting: Hematology and Oncology

## 2021-04-28 NOTE — Progress Notes (Signed)
  Radiation Oncology         [phone] ________________________________  Name: Melissa [name] MRN: [id number]  Date: 03/17/2021  DOB: 12/06/1965  End of Treatment Note  Diagnosis:   55 yo woman with three new brain metastases from ER+ cancer of the upper inner left breast     Indication for treatment:  Palliation       Radiation treatment dates:   03/17/21  Site/dose/beams/energy:   Three brain metastases targets (Rt Parafalcine 15 mm, Rt Temporal 13 mm, and Lt Cerebellar 3 mm) was treated using 6 Rapid Arc VMAT Beams to a prescription dose of 20 Gy.  ExacTrac registration was performed for each couch angle.  The 100% isodose line was prescribed.  6 MV X-rays were delivered in the flattening filter free beam mode.  Narrative: The patient tolerated radiation treatment relatively well.     Plan: The patient has completed radiation treatment. The patient will return to radiation oncology clinic for routine followup in one month. I advised her to call or return sooner if she has any questions or concerns related to her recovery or treatment. ________________________________  Sheral Apley. Tammi Klippel, M.D.

## 2021-05-03 ENCOUNTER — Other Ambulatory Visit: Payer: Self-pay

## 2021-05-03 ENCOUNTER — Inpatient Hospital Stay: Payer: 59 | Admitting: Pharmacist

## 2021-05-03 ENCOUNTER — Other Ambulatory Visit: Payer: Medicare Other

## 2021-05-03 ENCOUNTER — Other Ambulatory Visit: Payer: Self-pay | Admitting: *Deleted

## 2021-05-03 ENCOUNTER — Inpatient Hospital Stay: Payer: 59

## 2021-05-03 ENCOUNTER — Ambulatory Visit: Payer: Medicare Other

## 2021-05-03 VITALS — BP 106/93 | HR 78 | Temp 98.3°F | Resp 18 | Wt 95.5 lb

## 2021-05-03 VITALS — BP 115/65 | HR 72 | Temp 97.8°F | Resp 18 | Ht 62.0 in | Wt 96.4 lb

## 2021-05-03 DIAGNOSIS — Z79899 Other long term (current) drug therapy: Secondary | ICD-10-CM

## 2021-05-03 DIAGNOSIS — C50912 Malignant neoplasm of unspecified site of left female breast: Secondary | ICD-10-CM

## 2021-05-03 DIAGNOSIS — Z5112 Encounter for antineoplastic immunotherapy: Secondary | ICD-10-CM | POA: Diagnosis not present

## 2021-05-03 DIAGNOSIS — Z171 Estrogen receptor negative status [ER-]: Secondary | ICD-10-CM

## 2021-05-03 DIAGNOSIS — Z5181 Encounter for therapeutic drug level monitoring: Secondary | ICD-10-CM

## 2021-05-03 DIAGNOSIS — C50212 Malignant neoplasm of upper-inner quadrant of left female breast: Secondary | ICD-10-CM

## 2021-05-03 DIAGNOSIS — Z95828 Presence of other vascular implants and grafts: Secondary | ICD-10-CM

## 2021-05-03 LAB — CMP (CANCER CENTER ONLY)
ALT: 9 U/L (ref 0–44)
AST: 17 U/L (ref 15–41)
Albumin: 3.5 g/dL (ref 3.5–5.0)
Alkaline Phosphatase: 104 U/L (ref 38–126)
Anion gap: 8 (ref 5–15)
BUN: 13 mg/dL (ref 6–20)
CO2: 25 mmol/L (ref 22–32)
Calcium: 9.3 mg/dL (ref 8.9–10.3)
Chloride: 102 mmol/L (ref 98–111)
Creatinine: 0.76 mg/dL (ref 0.44–1.00)
GFR, Estimated: >60 mL/min (ref >60)
Glucose, Bld: 97 mg/dL (ref 70–99)
Potassium: 3.9 mmol/L (ref 3.5–5.1)
Sodium: 135 mmol/L (ref 135–145)
Total Bilirubin: 0.3 mg/dL (ref 0.3–1.2)
Total Protein: 7.5 g/dL (ref 6.5–8.1)

## 2021-05-03 LAB — CBC WITH DIFFERENTIAL (CANCER CENTER ONLY)
Abs Immature Granulocytes: 0.03 10*3/uL (ref 0.00–0.07)
Basophils Absolute: 0.1 10*3/uL (ref 0.0–0.1)
Basophils Relative: 1 %
Eosinophils Absolute: 1.5 10*3/uL — ABNORMAL HIGH (ref 0.0–0.5)
Eosinophils Relative: 12 %
HCT: 28.6 % — ABNORMAL LOW (ref 36.0–46.0)
Hemoglobin: 9.1 g/dL — ABNORMAL LOW (ref 12.0–15.0)
Immature Granulocytes: 0 %
Lymphocytes Relative: 17 %
Lymphs Abs: 2.1 10*3/uL (ref 0.7–4.0)
MCH: 26 pg (ref 26.0–34.0)
MCHC: 31.8 g/dL (ref 30.0–36.0)
MCV: 81.7 fL (ref 80.0–100.0)
Monocytes Absolute: 1.1 10*3/uL — ABNORMAL HIGH (ref 0.1–1.0)
Monocytes Relative: 9 %
Neutro Abs: 7.7 10*3/uL (ref 1.7–7.7)
Neutrophils Relative %: 61 %
Platelet Count: 363 10*3/uL (ref 150–400)
RBC: 3.5 MIL/uL — ABNORMAL LOW (ref 3.87–5.11)
RDW: 19.1 % — ABNORMAL HIGH (ref 11.5–15.5)
WBC Count: 12.5 10*3/uL — ABNORMAL HIGH (ref 4.0–10.5)
nRBC: 0 % (ref 0.0–0.2)

## 2021-05-03 MED ORDER — SODIUM CHLORIDE 0.9 % IV SOLN: Freq: Once | INTRAVENOUS | Status: AC

## 2021-05-03 MED ORDER — SODIUM CHLORIDE 0.9% FLUSH
10.0000 mL | INTRAVENOUS | Status: DC | PRN
  Administered 2021-05-03: 10 mL

## 2021-05-03 MED ORDER — HEPARIN SOD (PORK) LOCK FLUSH 100 UNIT/ML IV SOLN
500.0000 [IU] | Freq: Once | INTRAVENOUS | Status: AC | PRN
  Administered 2021-05-03: 500 [IU]

## 2021-05-03 MED ORDER — DIPHENHYDRAMINE HCL 25 MG PO CAPS
25.0000 mg | ORAL_CAPSULE | Freq: Once | ORAL | Status: AC
  Administered 2021-05-03: 25 mg via ORAL
  Filled 2021-05-03: qty 1

## 2021-05-03 MED ORDER — ACETAMINOPHEN 325 MG PO TABS
650.0000 mg | ORAL_TABLET | Freq: Once | ORAL | Status: AC
  Administered 2021-05-03: 650 mg via ORAL
  Filled 2021-05-03: qty 2

## 2021-05-03 MED ORDER — TRASTUZUMAB-ANNS CHEMO 150 MG IV SOLR
6.0000 mg/kg | Freq: Once | INTRAVENOUS | Status: AC
  Administered 2021-05-03: 273 mg via INTRAVENOUS
  Filled 2021-05-03: qty 13

## 2021-05-03 NOTE — Patient Instructions (Signed)
[facility] MEDICAL ONCOLOGY  Discharge Instructions: Thank you for choosing [facility] to provide your oncology and hematology care.   If you have a lab appointment with the Cancer Center, please go directly to the Cancer Center and check in at the registration area.   Wear comfortable clothing and clothing appropriate for easy access to any Portacath or PICC line.   We strive to give you quality time with your provider. You may need to reschedule your appointment if you arrive late (15 or more minutes).  Arriving late affects you and other patients whose appointments are after yours.  Also, if you miss three or more appointments without notifying the office, you may be dismissed from the clinic at the provider's discretion.      For prescription refill requests, have your pharmacy contact our office and allow 72 hours for refills to be completed.    Today you received the following chemotherapy and/or immunotherapy agents trastuzumab      To help prevent nausea and vomiting after your treatment, we encourage you to take your nausea medication as directed.  BELOW ARE SYMPTOMS THAT SHOULD BE REPORTED IMMEDIATELY: *FEVER GREATER THAN 100.4 F (38 C) OR HIGHER *CHILLS OR SWEATING *NAUSEA AND VOMITING THAT IS NOT CONTROLLED WITH YOUR NAUSEA MEDICATION *UNUSUAL SHORTNESS OF BREATH *UNUSUAL BRUISING OR BLEEDING *URINARY PROBLEMS (pain or burning when urinating, or frequent urination) *BOWEL PROBLEMS (unusual diarrhea, constipation, pain near the anus) TENDERNESS IN MOUTH AND THROAT WITH OR WITHOUT PRESENCE OF ULCERS (sore throat, sores in mouth, or a toothache) UNUSUAL RASH, SWELLING OR PAIN  UNUSUAL VAGINAL DISCHARGE OR ITCHING   Items with * indicate a potential emergency and should be followed up as soon as possible or go to the Emergency Department if any problems should occur.  Please show the CHEMOTHERAPY ALERT CARD or IMMUNOTHERAPY ALERT CARD at check-in to  the Emergency Department and triage nurse.  Should you have questions after your visit or need to cancel or reschedule your appointment, please contact [facility]  Dept: [phone]  and follow the prompts.  Office hours are 8:00 a.m. to 4:30 p.m. Monday - Friday. Please note that voicemails left after 4:00 p.m. may not be returned until the following business day.  We are closed weekends and major holidays. You have access to a nurse at all times for urgent questions. Please call the main number to the clinic Dept: [phone] and follow the prompts.   For any non-urgent questions, you may also contact your provider using MyChart. We now offer e-Visits for anyone 18 and older to request care online for non-urgent symptoms. For details visit [email or web address].   Also download the MyChart app! Go to the app store, search "MyChart", open the app, select [facility], and log in with your MyChart username and password.  Due to Covid, a mask is required upon entering the hospital/clinic. If you do not have a mask, one will be given to you upon arrival. For doctor visits, patients may have 1 support person aged 18 or older with them. For treatment visits, patients cannot have anyone with them due to current Covid guidelines and our immunocompromised population.   

## 2021-05-03 NOTE — Progress Notes (Signed)
Per Dr. Lindi Adie, okay to treat with ECHO from 01/27/2021.

## 2021-05-03 NOTE — Progress Notes (Signed)
[facility]       Telephone: [phone]?Fax: [phone]   Oncology Clinical Pharmacist Practitioner Initial Assessment  Melissa [name] is a 55 y.o. female with a diagnosis of metastatic breast cancer.  Indication/Regimen Neratinib (Nerlynx) is being used appropriately for treatment of metastatic breast cancer by Dr. Nicholas Lose.    Wt Readings from Last 1 Encounters:  05/03/21 96 lb 6.4 oz (43.7 kg)    Estimated body surface area is 1.38 meters squared as calculated from the following:   Height as of this encounter: 5\' 2"  (1.575 m).   Weight as of this encounter: 96 lb 6.4 oz (43.7 kg).  The dosing regimen is escalated.  She started at 3 tabs on 04/26/21, increasing to 4 tabs today and if tolerated will increase to a max dose of 200 mg daily on 04/3121. It is planned to continue until disease progression or unacceptable toxicity. She also takes trastuzumab every 21 days which continues.  Next dose is today. Melissa [name] is doing well.  She reports 2 days of loose stools but controlled with loperamide.  We went over loperamide PRN instructions again today and reviewed side effects of neratinib. She does have anti-nausea meds at home and loperamide. She will likely have next ECHO in January 2023, last was 01/27/21. Neratinib package insert recommends labs monthly for 3 months then every 3 months or as clinically indicated.  She is currently receiving labs every 6 weeks so we will add labs for 05/24/21 since she will have labs today, and 06/14/21.  After 06/14/21, she can likely go back to every 6 week labs. Confirmed that she did discontinue the omeprazole d/t the interaction with neratinib and is now on 200 mg of voriconazole every 12 hours, followed by ID.  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 She is having no issues receiving her neratinib from [facility] and knows to call the [facility] 24/7 triage line for any fevers or side effects at  [phone].   Dose Modifications Dose escalation with neratinib as above, loperamide PRN   Allergies Allergies  Allergen Reactions   Aspirin Anaphylaxis and Shortness Of Breath    THROAT CLOSES   Protonix [Pantoprazole] Nausea Only and Other (See Comments)    Also caused a "film in the mouth" and caused chest pressure   Iodinated Diagnostic Agents Rash    Patient allergic to contrast used in radiation oncology for CT simulation     Laboratory Data CBC EXTENDED Latest Ref Rng & Units 05/03/2021 04/12/2021 03/22/2021  WBC 4.0 - 10.5 K/uL 12.5(H) 12.8(H) 13.0(H)  RBC 3.87 - 5.11 MIL/uL 3.50(L) 3.41(L) 3.65(L)  HGB 12.0 - 15.0 g/dL 9.1(L) 8.9(L) 9.5(L)  HCT 36.0 - 46.0 % 28.6(L) 27.7(L) 29.9(L)  PLT 150 - 400 K/uL 363 414(H) 431(H)  NEUTROABS 1.7 - 7.7 K/uL 7.7 8.6(H) 9.2(H)  LYMPHSABS 0.7 - 4.0 K/uL 2.1 1.9 1.8    CMP Latest Ref Rng & Units 05/03/2021 04/12/2021 03/22/2021  Glucose 70 - 99 mg/dL 97 77 88  BUN 6 - 20 mg/dL 13 13 11   Creatinine 0.44 - 1.00 mg/dL 0.76 0.78 0.83  Sodium 135 - 145 mmol/L 135 141 139  Potassium 3.5 - 5.1 mmol/L 3.9 4.1 4.3  Chloride 98 - 111 mmol/L 102 107 107  CO2 22 - 32 mmol/L 25 25 23   Calcium 8.9 - 10.3 mg/dL 9.3 9.4 9.8  Total Protein 6.5 - 8.1 g/dL 7.5 7.2 7.5  Total Bilirubin 0.3 - 1.2 mg/dL 0.3 <0.2(L) <0.2(L)  Alkaline Phos 38 - 126 U/L 104 110 115  AST 15 - 41 U/L 17 16 12(L)  ALT 0 - 44 U/L 9 9 10     Contraindications Contraindications were reviewed? Yes Contraindications to therapy were identified? No   Safety Precautions The following safety precautions for the use of neratinib were reviewed:  Diarrhea: has loperamide on hand and reviewed as needed instructions Liver toxicity: reviewed possible side effects such as RUQ pain, yellowing of skin/eyes Drug interactions: PPIs (avoid), H2RAs, antacids discussed how to space these agents out from neratinib Nausea / Vomiting: has ondansetron and prochlorperazine on hand  Medication  Reconciliation Current Outpatient Medications  Medication Sig Dispense Refill   albuterol (VENTOLIN HFA) 108 (90 Base) MCG/ACT inhaler Inhale 2 puffs into the lungs every 6 (six) hours as needed. 8 g 5   budesonide-formoterol (SYMBICORT) 160-4.5 MCG/ACT inhaler Inhale 2 puffs into the lungs 2 (two) times daily. 3 each 3   chlorpheniramine-HYDROcodone (TUSSIONEX) 10-8 MG/5ML SUER Take 5 mLs by mouth at bedtime. 115 mL 0   lidocaine-prilocaine (EMLA) cream APPLY EXTERNALLY TO THE AFFECTED AREA 1 TIME 30 g 3   naproxen sodium (ALEVE) 220 MG tablet Take 440 mg by mouth 2 (two) times daily as needed (headache).      voriconazole (VFEND) 200 MG tablet Take 1 tablet (200 mg total) by mouth 2 (two) times daily. 60 tablet 11   ELDERBERRY PO Take by mouth daily. Per patient, takes one daily (Patient not taking: Reported on 05/03/2021)     levalbuterol (XOPENEX) 0.63 MG/3ML nebulizer solution Take 3 mLs (0.63 mg total) by nebulization every 4 (four) hours as needed for wheezing or shortness of breath. (Patient not taking: Reported on 05/03/2021) 540 mL 6   megestrol (MEGACE) 20 MG tablet Take 1 tablet (20 mg total) by mouth daily. (Patient not taking: Reported on 05/03/2021) 30 tablet 3   Neratinib Maleate (NERLYNX) 40 MG tablet Take 6 tablets (240 mg total) by mouth daily. Take with food. (Patient taking differently: Take 160 mg by mouth daily. Current dose is 4 tabs daily. Dose increase up to 5 tabs (200 mg) if tolerated starting 05/10/21. Take with food.) 180 tablet 3   No current facility-administered medications for this visit.   Medication reconciliation is based on the patient's most recent medication list in the electronic medical record (EMR) including herbal products and OTC medications.   The patient's medication list was reviewed today with the patient? Yes   Drug-drug interactions (DDIs) DDIs were evaluated? Yes DDIs identified? No   Drug-Food Interactions Drug-food interactions were  evaluated? Yes Drug-food interactions identified?  Avoid grapefruit products  Follow-up Plan  Trastuzumab today Increase neratinib to 4 tabs today and if tolerated 5 tabs daily on 05/10/21 Labs, visit with Dr. Lindi Adie, and trastuzumab on 05/24/21 Labs, visit with pharmacy clinic, and trastuzumab on 06/14/21  Craig Guess Zeisler participated in the discussion, expressed understanding, and voiced agreement with the above plan. All questions were answered to her satisfaction. The patient was advised to contact the clinic at [phone] with any questions or concerns prior to her return visit.   I spent 30 minutes assessing the patient.  Raina Mina, RPH-CPP, 05/03/2021 9:37 AM

## 2021-05-03 NOTE — Progress Notes (Signed)
Nutrition Follow-up:  Patient with metastatic breast cancer (liver, bone, bran, questionable lung).  Patient on herceptin.   Met with patient during infusion.  Patient reports that her appetite is good and having less issues with stomach upset after switching to [employer] milk.  Drinking [employer] 1.4 shake at least 1 time per day and enjoying them (expensive or would drink more).  Patient reports that has not eaten as much this time due to having a craft show to prepare for and not stopping to eat as frequently.      Medications: reviewed  Labs: reviewed  Anthropometrics:   Weight 96 lb 6.4 oz today  97 lb on 10/3 95 lb on 9/12 UBW of 115-120 lb   NUTRITION DIAGNOSIS: Inadequate oral intake improved   INTERVENTION:  Patient to continue [employer] 1.4 shakes for added nutrition Continue to frequent meals/snacks of high calorie, high protein foods.     MONITORING, EVALUATION, GOAL: weight trends, intake   NEXT VISIT: Monday, Dec 5th during infusion  Taccara Bushnell B. Zenia Resides, [place], [place] Registered Dietitian [phone] (mobile)'

## 2021-05-05 ENCOUNTER — Other Ambulatory Visit: Payer: Self-pay | Admitting: Radiation Therapy

## 2021-05-05 ENCOUNTER — Encounter: Payer: Self-pay | Admitting: Hematology and Oncology

## 2021-05-05 DIAGNOSIS — C7931 Secondary malignant neoplasm of brain: Secondary | ICD-10-CM

## 2021-05-05 NOTE — Progress Notes (Signed)
Patient will get [facility] Q 63months per Dr. Lindi Adie.  Raul Del [place], [facility], [facility] 05/05/2021 4:10 PM

## 2021-05-05 NOTE — Addendum Note (Signed)
Addended by: Neysa Hotter on: 05/05/2021 04:12 PM   Modules accepted: Orders

## 2021-05-07 ENCOUNTER — Other Ambulatory Visit (HOSPITAL_COMMUNITY): Payer: Medicare Other

## 2021-05-18 ENCOUNTER — Ambulatory Visit: Payer: Medicare Other | Admitting: Infectious Disease

## 2021-05-18 ENCOUNTER — Telehealth: Payer: Self-pay | Admitting: *Deleted

## 2021-05-18 ENCOUNTER — Other Ambulatory Visit: Payer: Self-pay | Admitting: *Deleted

## 2021-05-18 DIAGNOSIS — C50912 Malignant neoplasm of unspecified site of left female breast: Secondary | ICD-10-CM

## 2021-05-18 DIAGNOSIS — C7931 Secondary malignant neoplasm of brain: Secondary | ICD-10-CM

## 2021-05-18 DIAGNOSIS — Z5181 Encounter for therapeutic drug level monitoring: Secondary | ICD-10-CM

## 2021-05-18 NOTE — Progress Notes (Signed)
Per MD pt to receive 1 L NS over 1 hour tomorrow 05/19/21.  Orders placed under sign and held, appt scheduled and pt verbalized understanding.

## 2021-05-18 NOTE — Telephone Encounter (Signed)
Received call from pt with complaint of diarrhea x8 episodes a day not alleviated by OTC imodium. Pt also reports nausea and decreased appetite.  Pt states symptoms began after increasing Nerlynx dose on 05/09/21. Pt states she is weak and requesting to be seen to decrease dosage of Nerlynx. Per MD pt needing labs, to be seen by oral pharmacist John, and infusion for 1 L NS.  Orders placed.

## 2021-05-19 ENCOUNTER — Other Ambulatory Visit: Payer: Self-pay

## 2021-05-19 ENCOUNTER — Inpatient Hospital Stay: Payer: 59 | Attending: Medical | Admitting: Pharmacist

## 2021-05-19 ENCOUNTER — Inpatient Hospital Stay: Payer: 59

## 2021-05-19 ENCOUNTER — Other Ambulatory Visit: Payer: Self-pay | Admitting: *Deleted

## 2021-05-19 VITALS — BP 96/60 | HR 76 | Temp 97.9°F | Resp 18 | Ht 62.0 in | Wt 91.4 lb

## 2021-05-19 DIAGNOSIS — Z5112 Encounter for antineoplastic immunotherapy: Secondary | ICD-10-CM | POA: Diagnosis not present

## 2021-05-19 DIAGNOSIS — C7931 Secondary malignant neoplasm of brain: Secondary | ICD-10-CM | POA: Insufficient documentation

## 2021-05-19 DIAGNOSIS — C50912 Malignant neoplasm of unspecified site of left female breast: Secondary | ICD-10-CM

## 2021-05-19 DIAGNOSIS — Z170 Estrogen receptor positive status [ER+]: Secondary | ICD-10-CM | POA: Insufficient documentation

## 2021-05-19 DIAGNOSIS — Z452 Encounter for adjustment and management of vascular access device: Secondary | ICD-10-CM | POA: Diagnosis not present

## 2021-05-19 DIAGNOSIS — C787 Secondary malignant neoplasm of liver and intrahepatic bile duct: Secondary | ICD-10-CM | POA: Insufficient documentation

## 2021-05-19 DIAGNOSIS — C50212 Malignant neoplasm of upper-inner quadrant of left female breast: Secondary | ICD-10-CM | POA: Diagnosis present

## 2021-05-19 DIAGNOSIS — Z95828 Presence of other vascular implants and grafts: Secondary | ICD-10-CM

## 2021-05-19 DIAGNOSIS — C7951 Secondary malignant neoplasm of bone: Secondary | ICD-10-CM | POA: Insufficient documentation

## 2021-05-19 LAB — CMP (CANCER CENTER ONLY)
ALT: 9 U/L (ref 0–44)
AST: 16 U/L (ref 15–41)
Albumin: 3.1 g/dL — ABNORMAL LOW (ref 3.5–5.0)
Alkaline Phosphatase: 137 U/L — ABNORMAL HIGH (ref 38–126)
Anion gap: 9 (ref 5–15)
BUN: 9 mg/dL (ref 6–20)
CO2: 24 mmol/L (ref 22–32)
Calcium: 9.3 mg/dL (ref 8.9–10.3)
Chloride: 102 mmol/L (ref 98–111)
Creatinine: 0.78 mg/dL (ref 0.44–1.00)
GFR, Estimated: >60 mL/min (ref >60)
Glucose, Bld: 82 mg/dL (ref 70–99)
Potassium: 4.1 mmol/L (ref 3.5–5.1)
Sodium: 135 mmol/L (ref 135–145)
Total Bilirubin: <0.2 mg/dL — ABNORMAL LOW (ref 0.3–1.2)
Total Protein: 7.5 g/dL (ref 6.5–8.1)

## 2021-05-19 LAB — CBC WITH DIFFERENTIAL (CANCER CENTER ONLY)
Abs Immature Granulocytes: 0.05 10*3/uL (ref 0.00–0.07)
Basophils Absolute: 0.1 10*3/uL (ref 0.0–0.1)
Basophils Relative: 1 %
Eosinophils Absolute: 1.6 10*3/uL — ABNORMAL HIGH (ref 0.0–0.5)
Eosinophils Relative: 13 %
HCT: 30 % — ABNORMAL LOW (ref 36.0–46.0)
Hemoglobin: 9.8 g/dL — ABNORMAL LOW (ref 12.0–15.0)
Immature Granulocytes: 0 %
Lymphocytes Relative: 14 %
Lymphs Abs: 1.7 10*3/uL (ref 0.7–4.0)
MCH: 26.6 pg (ref 26.0–34.0)
MCHC: 32.7 g/dL (ref 30.0–36.0)
MCV: 81.3 fL (ref 80.0–100.0)
Monocytes Absolute: 0.8 10*3/uL (ref 0.1–1.0)
Monocytes Relative: 6 %
Neutro Abs: 8 10*3/uL — ABNORMAL HIGH (ref 1.7–7.7)
Neutrophils Relative %: 66 %
Platelet Count: 462 10*3/uL — ABNORMAL HIGH (ref 150–400)
RBC: 3.69 MIL/uL — ABNORMAL LOW (ref 3.87–5.11)
RDW: 19.3 % — ABNORMAL HIGH (ref 11.5–15.5)
WBC Count: 12.2 10*3/uL — ABNORMAL HIGH (ref 4.0–10.5)
nRBC: 0 % (ref 0.0–0.2)

## 2021-05-19 LAB — MAGNESIUM: Magnesium: 1.9 mg/dL (ref 1.7–2.4)

## 2021-05-19 LAB — SAMPLE TO BLOOD BANK

## 2021-05-19 MED ORDER — SODIUM CHLORIDE 0.9 % IV SOLN: Freq: Once | INTRAVENOUS | Status: AC

## 2021-05-19 MED ORDER — ALTEPLASE 2 MG IJ SOLR
2.0000 mg | Freq: Once | INTRAMUSCULAR | Status: AC | PRN
  Administered 2021-05-19: 2 mg
  Filled 2021-05-19: qty 2

## 2021-05-19 MED ORDER — SODIUM CHLORIDE 0.9% FLUSH
10.0000 mL | INTRAVENOUS | Status: DC | PRN
  Administered 2021-05-19: 10 mL

## 2021-05-19 NOTE — Progress Notes (Signed)
[facility]       Telephone: [phone]?Fax: [phone]   Oncology Clinical Pharmacist Practitioner Progress Note  Melissa [name] was contacted via in-person visit to discuss her chemotherapy regimen for neratinib which she receives under the care of Dr. Nicholas Lose.   Current treatment regimen and start date Neratinib (started 04/26/21) with trastuzumab (06/15/20)  Interval History She continues on neratinib 200 mg by mouth  daily on days 1 to 28 of a 28-day cycle. This is being given  in combination with every 21 day trastuzumab . Therapy is planned to continue until disease progression or unacceptable toxicity.   Response to Therapy Ms. Manske contacted Dr. Geralyn Flash office yesterday reporting increase in loose stools since increasing to 5 tabs (200 mg) of neratinib on 05/10/21. She had been on 4 tablets which started on 05/03/21 and was having loose stools that were responding to loperamide.  Since being on neratinib at 5 tablets per day, the loose stools have been 3-4 times a day and not responding to loperamide despite taking 8 tablets of loperamide daily with each loose stool.  She also has not had an appetite and the meal replacement shakes seem to "go right through" her. She has lost 4 pounds since her last visit on 05/03/21. She states she has been drinking plenty of fluids and is now drinking Gatorade.  Her labs today were acceptable to continue treatment with neratinib.  However, as a precaution and to give her bowel some time to rest, she was in agreement to hold the neratinib until Monday 05/24/21 when she sees Dr. Lindi Adie again and will have trastuzumab administered.  She will also receive IVF today. At her next appointment with Dr. Gladstone Pih, if the diarrhea has subsided, she is in agreement to go back to 4 tablets of neratinib daily.  She feels that this dose was manageable for her and was responding to loperamide when she had loose stools.  She also had a much better  appetite when on 4 tablets of neratinib. Her daughter is getting married in March that she is looking forward to.  Neratinib plan was discussed with Dr. Lindi Adie and he is in agreement.  Labs were reviewed with [name] today and both patient and provider are in agreement to HOLD therapy at this time.  Allergies Allergies  Allergen Reactions   Aspirin Anaphylaxis and Shortness Of Breath    THROAT CLOSES   Protonix [Pantoprazole] Nausea Only and Other (See Comments)    Also caused a "film in the mouth" and caused chest pressure   Iodinated Diagnostic Agents Rash    Patient allergic to contrast used in radiation oncology for CT simulation     Vitals Vitals:   05/19/21 1057  BP: 96/60  Pulse: 76  Resp: 18  Temp: 97.9 F (36.6 C)  SpO2: 100%    Laboratory Data CBC EXTENDED Latest Ref Rng & Units 05/19/2021 05/03/2021 04/12/2021  WBC 4.0 - 10.5 K/uL 12.2(H) 12.5(H) 12.8(H)  RBC 3.87 - 5.11 MIL/uL 3.69(L) 3.50(L) 3.41(L)  HGB 12.0 - 15.0 g/dL 9.8(L) 9.1(L) 8.9(L)  HCT 36.0 - 46.0 % 30.0(L) 28.6(L) 27.7(L)  PLT 150 - 400 K/uL 462(H) 363 414(H)  NEUTROABS 1.7 - 7.7 K/uL 8.0(H) 7.7 8.6(H)  LYMPHSABS 0.7 - 4.0 K/uL 1.7 2.1 1.9    CMP Latest Ref Rng & Units 05/19/2021 05/03/2021 04/12/2021  Glucose 70 - 99 mg/dL 82 97 77  BUN 6 - 20 mg/dL 9 13 13   Creatinine  0.44 - 1.00 mg/dL 0.78 0.76 0.78  Sodium 135 - 145 mmol/L 135 135 141  Potassium 3.5 - 5.1 mmol/L 4.1 3.9 4.1  Chloride 98 - 111 mmol/L 102 102 107  CO2 22 - 32 mmol/L 24 25 25   Calcium 8.9 - 10.3 mg/dL 9.3 9.3 9.4  Total Protein 6.5 - 8.1 g/dL 7.5 7.5 7.2  Total Bilirubin 0.3 - 1.2 mg/dL <0.2(L) 0.3 <0.2(L)  Alkaline Phos 38 - 126 U/L 137(H) 104 110  AST 15 - 41 U/L 16 17 16   ALT 0 - 44 U/L 9 9 9     Lab Results  Component Value Date   MG 1.9 05/19/2021   Adverse Effects Assessment Diarrhea: increased when neratinib was increased to 5 tablets on 05/10/21.  As above, will HOLD neratinib until 05/24/21 and then if  diarrhea has subsided, dose reduce to neratinib 4 tablets daily.  Adherence Assessment Jianni Shelden Gatliff reports missing 0 doses over the past 2 weeks.   Reason for missed dose: N/A Patient was re-educated on importance of adherence.    Access Assessment FALINE LANGER is currently receiving her neratinib through  [employer] concerns:  No  Medication Reconciliation The patient's medication list was reviewed today with the patient? Yes New medications or herbal supplements have recently been started? No  Any medications have been discontinued? No  The medication list was updated and reconciled based on the patient's most recent medication list in the electronic medical record (EMR) including herbal products and OTC medications.   Medications Current Outpatient Medications  Medication Sig Dispense Refill   albuterol (VENTOLIN HFA) 108 (90 Base) MCG/ACT inhaler Inhale 2 puffs into the lungs every 6 (six) hours as needed. 8 g 5   budesonide-formoterol (SYMBICORT) 160-4.5 MCG/ACT inhaler Inhale 2 puffs into the lungs 2 (two) times daily. 3 each 3   chlorpheniramine-HYDROcodone (TUSSIONEX) 10-8 MG/5ML SUER Take 5 mLs by mouth at bedtime. 115 mL 0   ELDERBERRY PO Take by mouth daily. Per patient, takes one daily (Patient not taking: Reported on 05/03/2021)     levalbuterol (XOPENEX) 0.63 MG/3ML nebulizer solution Take 3 mLs (0.63 mg total) by nebulization every 4 (four) hours as needed for wheezing or shortness of breath. (Patient not taking: Reported on 05/03/2021) 540 mL 6   lidocaine-prilocaine (EMLA) cream APPLY EXTERNALLY TO THE AFFECTED AREA 1 TIME 30 g 3   megestrol (MEGACE) 20 MG tablet Take 1 tablet (20 mg total) by mouth daily. (Patient not taking: Reported on 05/03/2021) 30 tablet 3   naproxen sodium (ALEVE) 220 MG tablet Take 440 mg by mouth 2 (two) times daily as needed (headache).      Neratinib Maleate (NERLYNX) 40 MG tablet Take 6 tablets (240 mg total) by mouth daily.  Take with food. (Patient taking differently: Take 160 mg by mouth daily. Current dose is 4 tabs daily. Dose increase up to 5 tabs (200 mg) if tolerated starting 05/10/21. Take with food.) 180 tablet 3   voriconazole (VFEND) 200 MG tablet Take 1 tablet (200 mg total) by mouth 2 (two) times daily. 60 tablet 11   No current facility-administered medications for this visit.    Drug-Drug Interactions (DDIs) DDIs were evaluated? Yes Significant DDIs?  Voriconazole previously reviewed and managed by ID.  Her appointment at [facility] went well.  They agreed with [facility] ID management according to patient. The patient was instructed to speak with their health care provider and/or the oral chemotherapy pharmacist before starting any  new drug, including prescription or over the counter, natural / herbal products, or vitamins.  Supportive Care Reviewed administration instructions for loperamide PRN Ms. Rosander will use anti-nausea medications PRN. Not needing them currently Ms. Khanna will reach out to clinic prior to her visit with Dr. Lindi Adie if diarrhea does not improve and loperamide is ineffective  Dosing Assessment Hepatic adjustments needed? No  Renal adjustments needed? No  Toxicity adjustments needed?  Yes, as above, will HOLD neratinib for now.  Restart after 05/24/21 visit with Dr. Lindi Adie if diarrhea has improved.  Will restart at 4 tabs (160 mg) daily The current dosing regimen is not appropriate to continue at this time.  Follow-Up Plan HOLD neratinib: possible restart after 05/24/21 visit with Dr. Lindi Adie.  If tolerated, will restart at 4 tabs (160 mg) daily with food 05/24/21: Labs / visit with Dr. Lindi Adie / trastuzumab infusion 06/14/21: Labs / visit with pharmacy clinic / trastuzumab infusion  Craig Guess Gilden participated in the discussion, expressed understanding, and voiced agreement with the above plan. All questions were answered to her satisfaction. The patient was advised to contact the  clinic at [phone] with any questions or concerns prior to her return visit.   I spent 30 minutes assessing and educating the patient.  Raina Mina, RPH-CPP, 05/19/2021  11:33 AM

## 2021-05-21 ENCOUNTER — Other Ambulatory Visit: Payer: Self-pay | Admitting: *Deleted

## 2021-05-21 DIAGNOSIS — C50912 Malignant neoplasm of unspecified site of left female breast: Secondary | ICD-10-CM

## 2021-05-22 NOTE — Progress Notes (Signed)
Patient Care Team: Patient, [name] as PCP - General (General Practice) [name], MD as Referring Physician (Emergency Medicine)  DIAGNOSIS:    ICD-10-CM   1. 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 Malignant neoplasm of upper-inner quadrant of left breast in female, estrogen receptor positive ([place])  C50.212    Z17.0       SUMMARY OF ONCOLOGIC HISTORY: Oncology History  Breast cancer of upper-inner quadrant of left female breast ([place])  06/08/2012 Initial Diagnosis   invasive ductal carcinoma that was ER positive PR positive HER-2/neu positive measuring 3.1 cm by MRI criteria. Ki-67 was 70% HER-2 was amplified with a ratio 2.91   07/12/2012 - 07/17/2013 Neo-Adjuvant Chemotherapy   TCH 6 followed by Herceptin maintenance   12/11/2012 Surgery   Left breast lumpectomy: 1.8 cm tumor 1 positive sentinel node, axillary lymph node dissection 02/08/2013 showed 0/13 lymph nodes   03/25/2013 - 05/06/2013 Radiation Therapy   Adjuvant radiation therapy   06/05/2013 - 07/20/2017 Anti-estrogen oral therapy   Tamoxifen 20 mg daily   07/27/2017 Relapse/Recurrence   MRI Brain: 3.4 x 2.9 x 2.9 cm RIGHT frontal lobe mass with imaging characteristics of solitary metastasis. Extensive vasogenic edema resulting in 9 mm RIGHT to LEFT midline shift. Equivocal very early LEFT ventricle entrapment.    08/04/2017 Surgery   Rt frontal brain resection: Poorly differentiated tumor IHC suggests breast primary ER and PR Positive   08/25/2017 - 09/04/2017 Radiation Therapy   Stereotactic radiation   09/18/2017 -  Anti-estrogen oral therapy   Lapatinib with letrozole   12/18/2017 - 12/19/2017 Radiation Therapy   New right parietal lobe metastases status post [facility]   08/27/2018 - 08/27/2020 Radiation Therapy   SRS to new brain metastases   05/09/2019 Relapse/Recurrence   Interval increase in size of the enhancing nodular left internal mammary soft tissue 2.8  cm.  Redemonstrated enlarged supraclavicular, lower cervical and lower posterior cervical nodes unchanged.  Interval increase in the bony erosion of the posterior and lateral left third rib, increasing soft tissue lesion eroding the left sternal body 3.1 cm was 2.5 cm.  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 with above-mentioned history of metastatic breast cancer with brain metastasis s/p resection who is currently on treatment with Xeloda and Herceptin. She reports to the clinic today for cycle 17.  She had profound diarrhea to neratinib and the dosage was increased to 5 tablets a day.  When she was at 4 tablets she was also having diarrhea but when he went up to 5 it was uncontrollable and finally she stopped it and today she is feeling significantly better.  Her appetite has come back and she is able to eat better.  When she was having so much diarrhea she could not do anything and was sleeping all day with lack of energy.  When she was on 3 tablets of neratinib she felt significantly better.  ALLERGIES:  is  allergic to aspirin, protonix [pantoprazole], and iodinated diagnostic agents.  MEDICATIONS:  Current Outpatient Medications  Medication Sig Dispense Refill   albuterol (VENTOLIN HFA) 108 (90 Base) MCG/ACT inhaler Inhale 2 puffs into the lungs every 6 (six) hours as needed. 8 g 5   budesonide-formoterol (SYMBICORT) 160-4.5 MCG/ACT inhaler Inhale 2 puffs into the lungs 2 (two) times daily. 3 each 3    chlorpheniramine-HYDROcodone (TUSSIONEX) 10-8 MG/5ML SUER Take 5 mLs by mouth at bedtime. 115 mL 0   ELDERBERRY PO Take by mouth daily. Per patient, takes one daily (Patient not taking: Reported on 05/03/2021)     levalbuterol (XOPENEX) 0.63 MG/3ML nebulizer solution Take 3 mLs (0.63 mg total) by nebulization every 4 (four) hours as needed for wheezing or shortness of breath. (Patient not taking: Reported on 05/03/2021) 540 mL 6   lidocaine-prilocaine (EMLA) cream APPLY EXTERNALLY TO THE AFFECTED AREA 1 TIME 30 g 3   megestrol (MEGACE) 20 MG tablet Take 1 tablet (20 mg total) by mouth daily. (Patient not taking: Reported on 05/03/2021) 30 tablet 3   naproxen sodium (ALEVE) 220 MG tablet Take 440 mg by mouth 2 (two) times daily as needed (headache).      Neratinib Maleate (NERLYNX) 40 MG tablet Take 6 tablets (240 mg total) by mouth daily. Take with food. (Patient taking differently: Take 160 mg by mouth daily. Current dose is 4 tabs daily. Dose increase up to 5 tabs (200 mg) if tolerated starting 05/10/21. Take with food.) 180 tablet 3   voriconazole (VFEND) 200 MG tablet Take 1 tablet (200 mg total) by mouth 2 (two) times daily. 60 tablet 11   No current facility-administered medications for this visit.    PHYSICAL EXAMINATION: ECOG PERFORMANCE STATUS: 1 - Symptomatic but completely ambulatory  Vitals:   05/24/21 0904  BP: 101/64  Pulse: 70  Resp: 18  Temp: 97.9 F (36.6 C)  SpO2: 100%   Filed Weights   05/24/21 0904  Weight: 93 lb 12.8 oz (42.5 kg)    LABORATORY DATA:  I have reviewed the data as listed CMP Latest Ref Rng & Units 05/19/2021 05/03/2021 04/12/2021  Glucose 70 - 99 mg/dL 82 97 77  BUN 6 - 20 mg/dL _0 Creatinine 0.44 - 1.00 mg/dL 0.78 0.76 0.78  Sodium 135 - 145 mmol/L 135 135 141  Potassium 3.5 - 5.1 mmol/L 4.1 3.9 4.1  Chloride 98 - 111 mmol/L 102 102 107  CO2 22 - 32 mmol/L _1 Calcium 8.9 - 10.3 mg/dL 9.3 9.3 9.4  Total Protein 6.5 - 8.1 g/dL 7.5  7.5 7.2  Total Bilirubin 0.3 - 1.2 mg/dL <0.2(L) 0.3 <0.2(L)  Alkaline Phos 38 - 126 U/L 137(H) 104 110  AST 15 - 41 U/L _2 ALT 0 - 44 U/L _3 Lab Results  Component Value Date   WBC 12.2 (H) 05/24/2021   HGB 9.0 (L) 05/24/2021   HCT 27.2 (L) 05/24/2021   MCV 82.7 05/24/2021   PLT 422 (H) 05/24/2021   NEUTROABS 8.0 (H) 05/24/2021    ASSESSMENT & PLAN:  Breast cancer of upper-inner quadrant of left female breast ([place]) 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  Sealy brain: 08/25/2017-09/04/2017 2. Anti Her 2 therapy with Lapatinib started 09/17/2017-05/14/2019: Stopped for progression 3.  I discontinued tamoxifen and started her on letrozole 2.5 mg daily.  05/14/2019 stopped for progression 4.  Stereotactic radiosurgery 12/19/2017 to the new right parietal lobe metastases. 5.  Kadcyla: Received 16 cycles discontinued 05/05/2020 6.  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 Severe anemia: Asymptomatic we will watch.  Previously received blood transfusion.  Today's hemoglobin is 9 2. severe diarrhea due to Neratinib: Resolved after stopping it.  We reduced the neratinib to 3 tablets a day starting 05/27/2021.  She had to be given IV fluids    CT CAP 02/26/2021: Increase in bronchial dilation and cavitation with nodularity worsening in the left lung base.  Improvement in nodular areas in the left upper lobe.  No distant metastatic disease. Mammogram 02/23/2021: Benign breast density category C I renewed her prescription for the cough syrup today.   Return to clinic in 3 weeks for Herceptin  CT CAP will be ordered for 07/22/2021    No orders of the defined types were placed in this encounter.  The patient has a good understanding of the overall plan. she agrees with it. she will call with any problems that may develop before the next visit here.  Total time spent: 30 mins including face to face time and time spent for planning, charting and coordination of care  Rulon Eisenmenger, MD, MPH 05/24/2021  I, Thana Ates, am acting as scribe for Dr. Nicholas Lose.  I have reviewed the above documentation for accuracy and completeness, and I agree with the above.

## 2021-05-23 NOTE — Assessment & Plan Note (Signed)
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 Anti Her 2 therapy with Lapatinibstarted 09/17/2017-05/14/2019: Stopped for progression 3.I discontinuedtamoxifen and started her on letrozole 2.5 mg daily.05/14/2019 stopped for progression 4.Stereotactic radiosurgery 12/19/2017 to the new right parietal lobe metastases. 5.Kadcyla: Received 16 cycles discontinued 05/05/2020 6.  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 Previously received blood transfusion. Today's hemoglobin is 8.9   Rash on the  dorsum of the hands: Continuing to use moisturizers.Tucatinib has been discontinued.It has also resolved  Pustular lesions on the dorsum of the hand:Resolved  CT CAP 02/26/2021: Increase in bronchial dilation and cavitation with nodularity worsening in the left lung base. Improvement in nodular areas in the left upper lobe. No distant metastatic disease. Mammogram 02/23/2021: Benign breast density category C She wants to get a second opinion regarding her lung issues.   Return to clinic in 3 weeks for Herceptin

## 2021-05-24 ENCOUNTER — Inpatient Hospital Stay: Payer: 59

## 2021-05-24 ENCOUNTER — Inpatient Hospital Stay (HOSPITAL_BASED_OUTPATIENT_CLINIC_OR_DEPARTMENT_OTHER): Payer: 59 | Admitting: Hematology and Oncology

## 2021-05-24 ENCOUNTER — Ambulatory Visit (INDEPENDENT_AMBULATORY_CARE_PROVIDER_SITE_OTHER): Payer: Medicare Other | Admitting: Infectious Disease

## 2021-05-24 ENCOUNTER — Other Ambulatory Visit (HOSPITAL_COMMUNITY): Payer: Self-pay

## 2021-05-24 ENCOUNTER — Encounter: Payer: Self-pay | Admitting: Infectious Disease

## 2021-05-24 ENCOUNTER — Other Ambulatory Visit: Payer: Self-pay

## 2021-05-24 VITALS — BP 142/75 | HR 85 | Temp 98.4°F | Resp 16 | Ht 62.0 in | Wt 95.2 lb

## 2021-05-24 VITALS — BP 101/64 | HR 70 | Temp 97.9°F | Resp 18 | Ht 62.0 in | Wt 93.8 lb

## 2021-05-24 DIAGNOSIS — B479 Mycetoma, unspecified: Secondary | ICD-10-CM | POA: Diagnosis present

## 2021-05-24 DIAGNOSIS — C7931 Secondary malignant neoplasm of brain: Secondary | ICD-10-CM | POA: Diagnosis not present

## 2021-05-24 DIAGNOSIS — F17200 Nicotine dependence, unspecified, uncomplicated: Secondary | ICD-10-CM

## 2021-05-24 DIAGNOSIS — R197 Diarrhea, unspecified: Secondary | ICD-10-CM

## 2021-05-24 DIAGNOSIS — Z8619 Personal history of other infectious and parasitic diseases: Secondary | ICD-10-CM

## 2021-05-24 DIAGNOSIS — Z170 Estrogen receptor positive status [ER+]: Secondary | ICD-10-CM

## 2021-05-24 DIAGNOSIS — Z23 Encounter for immunization: Secondary | ICD-10-CM

## 2021-05-24 DIAGNOSIS — B449 Aspergillosis, unspecified: Secondary | ICD-10-CM

## 2021-05-24 DIAGNOSIS — Z95828 Presence of other vascular implants and grafts: Secondary | ICD-10-CM

## 2021-05-24 DIAGNOSIS — C787 Secondary malignant neoplasm of liver and intrahepatic bile duct: Secondary | ICD-10-CM | POA: Diagnosis not present

## 2021-05-24 DIAGNOSIS — C50212 Malignant neoplasm of upper-inner quadrant of left female breast: Secondary | ICD-10-CM

## 2021-05-24 DIAGNOSIS — J984 Other disorders of lung: Secondary | ICD-10-CM

## 2021-05-24 DIAGNOSIS — C50919 Malignant neoplasm of unspecified site of unspecified female breast: Secondary | ICD-10-CM | POA: Diagnosis not present

## 2021-05-24 DIAGNOSIS — C50912 Malignant neoplasm of unspecified site of left female breast: Secondary | ICD-10-CM

## 2021-05-24 DIAGNOSIS — Z5112 Encounter for antineoplastic immunotherapy: Secondary | ICD-10-CM | POA: Diagnosis not present

## 2021-05-24 DIAGNOSIS — Z171 Estrogen receptor negative status [ER-]: Secondary | ICD-10-CM

## 2021-05-24 HISTORY — DX: Nicotine dependence, unspecified, uncomplicated: F17.200

## 2021-05-24 LAB — CBC WITH DIFFERENTIAL (CANCER CENTER ONLY)
Abs Immature Granulocytes: 0.04 10*3/uL (ref 0.00–0.07)
Basophils Absolute: 0.1 10*3/uL (ref 0.0–0.1)
Basophils Relative: 1 %
Eosinophils Absolute: 1.7 10*3/uL — ABNORMAL HIGH (ref 0.0–0.5)
Eosinophils Relative: 14 %
HCT: 27.2 % — ABNORMAL LOW (ref 36.0–46.0)
Hemoglobin: 9 g/dL — ABNORMAL LOW (ref 12.0–15.0)
Immature Granulocytes: 0 %
Lymphocytes Relative: 13 %
Lymphs Abs: 1.6 10*3/uL (ref 0.7–4.0)
MCH: 27.4 pg (ref 26.0–34.0)
MCHC: 33.1 g/dL (ref 30.0–36.0)
MCV: 82.7 fL (ref 80.0–100.0)
Monocytes Absolute: 0.8 10*3/uL (ref 0.1–1.0)
Monocytes Relative: 7 %
Neutro Abs: 8 10*3/uL — ABNORMAL HIGH (ref 1.7–7.7)
Neutrophils Relative %: 65 %
Platelet Count: 422 10*3/uL — ABNORMAL HIGH (ref 150–400)
RBC: 3.29 MIL/uL — ABNORMAL LOW (ref 3.87–5.11)
RDW: 19.9 % — ABNORMAL HIGH (ref 11.5–15.5)
WBC Count: 12.2 10*3/uL — ABNORMAL HIGH (ref 4.0–10.5)
nRBC: 0 % (ref 0.0–0.2)

## 2021-05-24 LAB — CMP (CANCER CENTER ONLY)
ALT: 8 U/L (ref 0–44)
AST: 14 U/L — ABNORMAL LOW (ref 15–41)
Albumin: 2.7 g/dL — ABNORMAL LOW (ref 3.5–5.0)
Alkaline Phosphatase: 108 U/L (ref 38–126)
Anion gap: 8 (ref 5–15)
BUN: 9 mg/dL (ref 6–20)
CO2: 24 mmol/L (ref 22–32)
Calcium: 8.9 mg/dL (ref 8.9–10.3)
Chloride: 108 mmol/L (ref 98–111)
Creatinine: 0.67 mg/dL (ref 0.44–1.00)
GFR, Estimated: >60 mL/min (ref >60)
Glucose, Bld: 75 mg/dL (ref 70–99)
Potassium: 4.2 mmol/L (ref 3.5–5.1)
Sodium: 140 mmol/L (ref 135–145)
Total Bilirubin: <0.2 mg/dL — ABNORMAL LOW (ref 0.3–1.2)
Total Protein: 7 g/dL (ref 6.5–8.1)

## 2021-05-24 MED ORDER — ACETAMINOPHEN 325 MG PO TABS
650.0000 mg | ORAL_TABLET | Freq: Once | ORAL | Status: AC
  Administered 2021-05-24: 650 mg via ORAL
  Filled 2021-05-24: qty 2

## 2021-05-24 MED ORDER — DIPHENHYDRAMINE HCL 25 MG PO CAPS
25.0000 mg | ORAL_CAPSULE | Freq: Once | ORAL | Status: AC
  Administered 2021-05-24: 25 mg via ORAL
  Filled 2021-05-24: qty 1

## 2021-05-24 MED ORDER — HYDROCOD POLST-CPM POLST ER 10-8 MG/5ML PO SUER: 5.0000 mL | Freq: Every day | ORAL | 0 refills | Status: DC

## 2021-05-24 MED ORDER — HEPARIN SOD (PORK) LOCK FLUSH 100 UNIT/ML IV SOLN
500.0000 [IU] | Freq: Once | INTRAVENOUS | Status: AC | PRN
  Administered 2021-05-24: 500 [IU]

## 2021-05-24 MED ORDER — PREDNISONE 50 MG PO TABS: ORAL_TABLET | ORAL | 1 refills | Status: DC

## 2021-05-24 MED ORDER — VORICONAZOLE 200 MG PO TABS
200.0000 mg | ORAL_TABLET | Freq: Two times a day (BID) | ORAL | 11 refills | Status: DC
  Filled 2021-05-24 – 2021-05-31 (×3): qty 60, 30d supply, fill #0
  Filled 2021-06-23: qty 60, 30d supply, fill #1
  Filled 2021-08-11: qty 60, 30d supply, fill #2
  Filled 2021-09-24: qty 60, 30d supply, fill #3
  Filled 2021-10-28: qty 60, 30d supply, fill #4

## 2021-05-24 MED ORDER — SODIUM CHLORIDE 0.9% FLUSH
10.0000 mL | INTRAVENOUS | Status: DC | PRN
  Administered 2021-05-24: 10 mL

## 2021-05-24 MED ORDER — TRASTUZUMAB-ANNS CHEMO 150 MG IV SOLR
6.0000 mg/kg | Freq: Once | INTRAVENOUS | Status: AC
  Administered 2021-05-24: 273 mg via INTRAVENOUS
  Filled 2021-05-24: qty 13

## 2021-05-24 MED ORDER — NERATINIB MALEATE 40 MG PO TABS: 120.0000 mg | ORAL_TABLET | Freq: Every day | ORAL | 3 refills | Status: DC

## 2021-05-24 MED ORDER — SODIUM CHLORIDE 0.9 % IV SOLN: Freq: Once | INTRAVENOUS | Status: AC

## 2021-05-24 NOTE — Progress Notes (Signed)
Subjective:  Complaint: Recent diarrhea after starting on neratnib and escalating dose   Patient ID: Melissa [name], female    DOB: 07/24/1965, 55 y.o.   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  Along the way she was diagnosed with breast cancer 2013 underwent lumpectomy and chemotherapy.  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  She is following with Dr. Lindi Adie with oncology.  She is subsequent been found to have other metastases including to her sternum and liver.  She developed cavitary lung infection and underwent bronchoscopy which revealed Aspergillus fumigatus.  She was on Cresemba since then.  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  She also has some spiculated lesions in the posterior left lobe lobe of her lung.  Radiology is concerned that the apparent mycetoma is due to Aspergillus or another mold.  There was similar concerned that the spiculated areas could represent Aspergillus though we have no culture data to guide Melissa [name].  She continued on Cresemba at that point  She then had bronchoscopy with BAL.  Fungal cultures from the bronchoscopy have been negative though the patient was on Cresemba at the time.  There is 1 AFB culture that is positive but not for tuberculosis.  Since then apparently (and I had to go to [employer] on web (not available on epic) they stated that Mycobacteria could NOT grow on subculture so no ID  In the interim the patient's insurance had denied Cresemba and she has been changed to voriconazole.   Unfortunately she has been found to have metastases of her breast cancer to the brain.  She  underwent stereotactic radiation to these lesions by Dr. London Pepper  Her chemotherapeutic regimen previously included Tukysa (tucatinib) which can elevate voriconazole levels and potentially cause voriconazole toxicity including QT prolongation.  We checjked her voriconazole levels and they have been in the therapeutic range recently  EKG showed:  her EKG showed a QTC of 448 with a QTC of 465 ms last EKG that I could find in the computer that I could actually read was in 2019 in which she had a QTC of 442.   She did tell Melissa [name] that she is coughing less since being on voriconazole.   .  She had MRI of the brain done this February 2022 showed decrease in size of 3 lesions but multiple new lesions.  Her regimen was Xeloda Herceptin and Tucatinib  I last saw her she was developing a rash on her hands which worsened as well as on her lips.  Ultimately her oncologist Dr. Lindi Adie stopped her Xeloda and Tucatinib and she is on Herceptin alone.   Since I last saw Natori she had CT chest abdomen pelvis.  From an infectious disease standpoint there is increase in her cavitation which is not altogether surprising and there were areas of increased nodularity and specially in the left lower lung.  She was bothered by this and sought second opinion at [facility] and see by Dr. Gaylyn Lambert who carefully reviewed her case and felt that  there was no need for further aggressive inventions to work this up nor need to change antifungal therapy.  She also had an MRI of the brain that showed new lesions.  She was seen by Stereotactic Radiosurgery to 3 lesions on March 17 2021.  She started new chemotherapy in the form of neratinib whose drug concentrations are increased by voriconazole.  However Dr Lindi Adie started at a lower dose which they have tried to escalate.  She told me she took 3 pills without  difficulty but when she went to 5 pills she had severe diarrhea and had to be given IV fluids for rehydration.  She has minimal cough largely at night.  She is cut down on her smoking considerably and smokes much less during the wintertime she tried to do vaping instead but this made her coughing much worse.  Nicotine gum seems to be the best nicotine replacement plan for her.  She was getting her flu shot today and seemed more open to COVID-19 vaccination.        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   Asthma    Breast cancer ([place])    Bronchiectasis ([place])    Bronchiolitis    Cancer ([place])    breast cancer 2014   Cancer, metastatic to liver ([place])    8295   Complication of anesthesia    bp dropped + desat    COPD (chronic obstructive pulmonary disease) (HCC)    Dyspnea    DOE   GERD (gastroesophageal reflux disease)    H/O coccidioidomycosis    was reason for lung lobectomy   Headache(784.0)    due to eye strain or not eating   History of anemia    no current problem   History of asthma    as a child   History of breast cancer 2014   left   History of chemotherapy    finished 07/17/2013   History of hiatal hernia    AGE 69   Hx of radiation therapy 03/25/13-05/06/13   left breast 5000 cGy/25 sessions, left breast boost 1000 cGy/5 sessions   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   Pneumonia    LAST FLARE UP 01/2018   Rash 02/22/2021   Runny nose 07/30/2013   clear drainage   Wears dentures    upper    Past Surgical History:  Procedure Laterality Date   APPLICATION OF CRANIAL NAVIGATION N/A 08/04/2017   Procedure: APPLICATION OF CRANIAL NAVIGATION;  Surgeon: [name], [name], MD;  Location: [facility];  Service: Neurosurgery;  Laterality: N/A;   AXILLARY LYMPH NODE DISSECTION Left 02/08/2013   Procedure: LEFT AXILLARY DISSECTION;  Surgeon: Edward Jolly, MD;  Location: [facility];  Service:  General;  Laterality: Left;   BREAST CYST EXCISION Right 2006   BREAST LUMPECTOMY Left 2014   BREAST LUMPECTOMY WITH NEEDLE LOCALIZATION AND AXILLARY SENTINEL LYMPH NODE BX Left 12/31/2012   Procedure: NEEDLE LOCALIZATION LEFT BREAST LUMPECTOMY AND LEFT AXILLARY SENTENIAL LYMPH NODE BX;  Surgeon: Edward Jolly, MD;  Location: [facility];  Service: General;  Laterality: Left;   BRONCHIAL BRUSHINGS  03/17/2020   Procedure: BRONCHIAL BRUSHINGS;  Surgeon: Garner Nash, DO;  Location: [facility];  Service: Pulmonary;;   BRONCHIAL WASHINGS  03/17/2020   Procedure: BRONCHIAL WASHINGS;  Surgeon: Garner Nash, DO;  Location: [facility];  Service: Pulmonary;;   CESAREAN SECTION  1995/1996   CRANIOTOMY Right 08/04/2017   Procedure: Right Frontal craniotomy for resection of tumor with stereotactic navigation;  Surgeon: [name], [name], MD;  Location: [facility];  Service: Neurosurgery;  Laterality: Right;  Right Frontal craniotomy for resection of tumor with stereotactic navigation   IR CV LINE INJECTION  02/19/2021   IR IMAGING GUIDED PORT INSERTION  05/31/2019   LUNG LOBECTOMY Left 05/1996   upper lobe - due to [facility] Fever   PORT-A-CATH REMOVAL Right 08/02/2013   Procedure: REMOVAL PORT-A-CATH;  Surgeon: Edward Jolly, MD;  Location: [facility];  Service: General;  Laterality: Right;   PORTACATH PLACEMENT  07/02/2012   Procedure: INSERTION PORT-A-CATH;  Surgeon: Edward Jolly, MD;  Location: [facility];  Service: General;  Laterality: N/A;  right   VIDEO BRONCHOSCOPY N/A 03/17/2020   Procedure: VIDEO BRONCHOSCOPY;  Surgeon: Garner Nash, DO;  Location: [facility];  Service: Pulmonary;  Laterality: N/A;   VIDEO BRONCHOSCOPY WITH ENDOBRONCHIAL ULTRASOUND N/A 05/23/2018   Procedure: VIDEO BRONCHOSCOPY WITH ENDOBRONCHIAL ULTRASOUND;  Surgeon: Garner Nash, DO;  Location: [facility];  Service: Thoracic;  Laterality: N/A;    Family  History  Problem Relation Age of Onset   Emphysema Mother        was a smoker   Heart disease Mother    Melanoma Mother        dx in her 5s   Breast cancer Mother 77   Asthma Brother    Breast cancer Cousin        mother's maternal cousin; dx in her 64s      Social History   Socioeconomic History   Marital status: Married    Spouse name: Not on file   Number of children: 2   Years of education: Not on file   Highest education level: Not on file  Occupational History   Occupation: Secondary school teacher  Tobacco Use   Smoking status: Every Day    Packs/day: 0.30    Years: 38.00    Pack years: 11.40    Types: Cigarettes   Smokeless tobacco: Never   Tobacco comments:    10 cigarettes smoked daily 10/19/20 [facility]   Vaping Use   Vaping Use: Never used  Substance and Sexual Activity   Alcohol use: No   Drug use: No   Sexual activity: Not Currently  Other Topics Concern   Not on file  Social History Narrative   Not on file   Social Determinants of Health   Financial Resource Strain: Not on file  Food Insecurity: Not on file  Transportation Needs: Not on file  Physical Activity: Not on file  Stress: Not on file  Social Connections: Not on file    Allergies  Allergen Reactions   Aspirin Anaphylaxis and Shortness Of Breath    THROAT CLOSES   Protonix [Pantoprazole] Nausea Only and Other (See Comments)    Also caused a "film in the mouth" and caused chest pressure   Iodinated Diagnostic Agents Rash    Patient allergic to contrast used in radiation oncology for CT simulation      Current Outpatient Medications:    albuterol (VENTOLIN HFA) 108 (90 Base) MCG/ACT inhaler, Inhale 2 puffs into the lungs every 6 (six) hours as needed., Disp: 8 g, Rfl: 5   budesonide-formoterol (SYMBICORT) 160-4.5 MCG/ACT inhaler, Inhale 2 puffs into the lungs 2 (two) times daily., Disp: 3 each, Rfl: 3   chlorpheniramine-HYDROcodone (TUSSIONEX) 10-8 MG/5ML SUER, Take 5  mLs by mouth at  bedtime., Disp: 115 mL, Rfl: 0   ELDERBERRY PO, Take by mouth daily. Per patient, takes one daily (Patient not taking: Reported on 05/03/2021), Disp: , Rfl:    levalbuterol (XOPENEX) 0.63 MG/3ML nebulizer solution, Take 3 mLs (0.63 mg total) by nebulization every 4 (four) hours as needed for wheezing or shortness of breath. (Patient not taking: Reported on 05/03/2021), Disp: 540 mL, Rfl: 6   lidocaine-prilocaine (EMLA) cream, APPLY EXTERNALLY TO THE AFFECTED AREA 1 TIME, Disp: 30 g, Rfl: 3   megestrol (MEGACE) 20 MG tablet, Take 1 tablet (20 mg total) by mouth daily. (Patient not taking: Reported on 05/03/2021), Disp: 30 tablet, Rfl: 3   naproxen sodium (ALEVE) 220 MG tablet, Take 440 mg by mouth 2 (two) times daily as needed (headache). , Disp: , Rfl:    Neratinib Maleate (NERLYNX) 40 MG tablet, Take 3 tablets (120 mg total) by mouth daily. Take with food., Disp: 180 tablet, Rfl: 3   predniSONE (DELTASONE) 50 MG tablet, 1 tab 12 hours before scan, 1 tab 7 hours before and 1 tab 1 hour before, Disp: 3 tablet, Rfl: 1   voriconazole (VFEND) 200 MG tablet, Take 1 tablet (200 mg total) by mouth 2 (two) times daily., Disp: 60 tablet, Rfl: 11   Review of Systems  Constitutional:  Positive for unexpected weight change. Negative for activity change, appetite change, chills, diaphoresis, fatigue and fever.  HENT:  Negative for congestion, rhinorrhea, sinus pressure, sneezing, sore throat and trouble swallowing.   Eyes:  Negative for photophobia and visual disturbance.  Respiratory:  Positive for cough. Negative for chest tightness, shortness of breath, wheezing and stridor.   Cardiovascular:  Negative for chest pain, palpitations and leg swelling.  Gastrointestinal:  Negative for abdominal distention, abdominal pain, anal bleeding, blood in stool, constipation, diarrhea, nausea and vomiting.  Genitourinary:  Negative for difficulty urinating, dysuria, flank pain and hematuria.  Musculoskeletal:  Negative  for arthralgias, back pain, gait problem, joint swelling and myalgias.  Skin:  Negative for color change, pallor, rash and wound.  Neurological:  Negative for dizziness, tremors, weakness and light-headedness.  Hematological:  Negative for adenopathy. Does not bruise/bleed easily.  Psychiatric/Behavioral:  Negative for agitation, behavioral problems, confusion, decreased concentration, dysphoric mood and sleep disturbance.       Objective:   Physical Exam Constitutional:      General: She is not in acute distress.    Appearance: Normal appearance. She is well-developed. She is not ill-appearing or diaphoretic.  HENT:     Head: Normocephalic and atraumatic.     Right Ear: Hearing and external ear normal.     Left Ear: Hearing and external ear normal.     Nose: No nasal deformity or rhinorrhea.  Eyes:     General: No scleral icterus.    Conjunctiva/sclera: Conjunctivae normal.     Right eye: Right conjunctiva is not injected.     Left eye: Left conjunctiva is not injected.     Pupils: Pupils are equal, round, and reactive to light.  Neck:     Vascular: No JVD.  Cardiovascular:     Rate and Rhythm: Normal rate and regular rhythm.     Heart sounds: Normal heart sounds, S1 normal and S2 normal. No murmur heard.   No friction rub. No gallop.  Pulmonary:     Effort: Prolonged expiration present.     Breath sounds: No stridor. Wheezing present. No rhonchi.  Abdominal:     General: Bowel  sounds are normal. There is no distension.     Palpations: Abdomen is soft.     Tenderness: There is no abdominal tenderness.  Musculoskeletal:        General: Normal range of motion.     Right shoulder: Normal.     Left shoulder: Normal.     Cervical back: Normal range of motion and neck supple.     Right hip: Normal.     Left hip: Normal.     Right knee: Normal.     Left knee: Normal.  Lymphadenopathy:     Head:     Right side of head: No submandibular, preauricular or posterior auricular  adenopathy.     Left side of head: No submandibular, preauricular or posterior auricular adenopathy.     Cervical: No cervical adenopathy.     Right cervical: No superficial or deep cervical adenopathy.    Left cervical: No superficial or deep cervical adenopathy.  Skin:    General: Skin is warm and dry.     Coloration: Skin is not pale.     Findings: No abrasion, bruising, ecchymosis, erythema, lesion or rash.     Nails: There is no clubbing.  Neurological:     General: No focal deficit present.     Mental Status: She is alert and oriented to person, place, and time.     Sensory: No sensory deficit.     Coordination: Coordination normal.     Gait: Gait normal.  Psychiatric:        Attention and Perception: She is attentive.        Mood and Affect: Mood normal.        Speech: Speech normal.        Behavior: Behavior normal. Behavior is cooperative.        Thought Content: Thought content normal.        Judgment: Judgment normal.         Assessment & Plan:   Mycetoma:  I have personally reviewed her updated CT scan of the chest and MRI of the brain In August 26th, 2022 Aspergillosis: Again we will continue voriconazole I reviewed labs from today November 14 with her comprehensive metabolic panel being normal.  CBC differential showed slight white count of 12,200 hemoglobin 9 hematocrit 27.2 and platelets of 422 with 65% neutrophils   We will continue voriconazole for now though I did want to review the drug drug interaction both with our ID clinical pharmacist and also have looped in Dr. Lindi Adie who was aware and is looping in his pharmacist  Aspergillosis: As above we will continue her prescription for voriconazole    History of coccidiomycosis: Voriconazole will be active severe rash to the chemotherapy regimen this is resolved though she still has some hypopigmentation   Metastatic breast cancer with mets to the brain status post recent radiation therapy to the brain  chemotherapy regimen has been altered.  Diarrhea at higher dose of chemotherapy pill this improved with stopping the medication and now starting a lower dose.  Note the drug drug interaction mentioned above.  Smoking she is continuing to try to stop smoking altogether and I think she really has made quite a bit of progress vaccine counseling she was agreeable to influenza vaccine in fact asked for this and seems like she is more open to COVID-19 vaccination.

## 2021-05-24 NOTE — Patient Instructions (Signed)
[facility]COL[facility]  Discharge Instructions: Thank you for choosing [facility] to provide your oncology and hematology care.   If you have a lab appointment with the [facility], please go directly to the [facility] and check in at the registration area.   Wear comfortable clothing and clothing appropriate for easy access to any Portacath or PICC line.   We strive to give you quality time with your provider. You may need to reschedule your appointment if you arrive late (15 or more minutes).  Arriving late affects you and other patients whose appointments are after yours.  Also, if you miss three or more appointments without notifying the office, you may be dismissed from the clinic at the provider's discretion.      For prescription refill requests, have your pharmacy contact our office and allow 72 hours for refills to be completed.    Today you received the following chemotherapy and/or immunotherapy agent: trastuzumab      To help prevent nausea and vomiting after your treatment, we encourage you to take your nausea medication as directed.  BELOW ARE SYMPTOMS THAT SHOULD BE REPORTED IMMEDIATELY: *FEVER GREATER THAN 100.4 F (38 C) OR HIGHER *CHILLS OR SWEATING *NAUSEA AND VOMITING THAT IS NOT CONTROLLED WITH YOUR NAUSEA MEDICATION *UNUSUAL SHORTNESS OF BREATH *UNUSUAL BRUISING OR BLEEDING *URINARY PROBLEMS (pain or burning when urinating, or frequent urination) *BOWEL PROBLEMS (unusual diarrhea, constipation, pain near the anus) TENDERNESS IN MOUTH AND THROAT WITH OR WITHOUT PRESENCE OF ULCERS (sore throat, sores in mouth, or a toothache) UNUSUAL RASH, SWELLING OR PAIN  UNUSUAL VAGINAL DISCHARGE OR ITCHING   Items with * indicate a potential emergency and should be followed up as soon as possible or go to the Emergency Department if any problems should occur.  Please show the CHEMOTHERAPY ALERT CARD or IMMUNOTHERAPY ALERT CARD at check-in to  the Emergency Department and triage nurse.  Should you have questions after your visit or need to cancel or reschedule your appointment, please contact [facility]  Dept: [phone]  and follow the prompts.  Office hours are 8:00 a.m. to 4:30 p.m. Monday - Friday. Please note that voicemails left after 4:00 p.m. may not be returned until the following business day.  We are closed weekends and major holidays. You have access to a nurse at all times for urgent questions. Please call the main number to the clinic Dept: [phone] and follow the prompts.   For any non-urgent questions, you may also contact your provider using MyChart. We now offer e-Visits for anyone 60 and older to request care online for non-urgent symptoms. For details visit [email or web address].   Also download the MyChart app! Go to the app store, search "MyChart", open the app, select , and log in with your MyChart username and password.  Due to Covid, a mask is required upon entering the hospital/clinic. If you do not have a mask, one will be given to you upon arrival. For doctor visits, patients may have 1 support person aged 44 or older with them. For treatment visits, patients cannot have anyone with them due to current Covid guidelines and our immunocompromised population.

## 2021-05-24 NOTE — Patient Instructions (Signed)

## 2021-05-25 ENCOUNTER — Ambulatory Visit: Payer: Medicare Other | Admitting: Infectious Disease

## 2021-05-25 ENCOUNTER — Other Ambulatory Visit (HOSPITAL_COMMUNITY): Payer: Self-pay

## 2021-05-31 ENCOUNTER — Other Ambulatory Visit (HOSPITAL_COMMUNITY): Payer: Self-pay

## 2021-05-31 ENCOUNTER — Encounter: Payer: Self-pay | Admitting: Hematology and Oncology

## 2021-06-01 ENCOUNTER — Other Ambulatory Visit (HOSPITAL_COMMUNITY): Payer: Self-pay

## 2021-06-07 ENCOUNTER — Other Ambulatory Visit (HOSPITAL_COMMUNITY): Payer: Self-pay

## 2021-06-10 ENCOUNTER — Encounter: Payer: Self-pay | Admitting: Hematology and Oncology

## 2021-06-11 ENCOUNTER — Telehealth: Payer: Self-pay | Admitting: *Deleted

## 2021-06-11 ENCOUNTER — Other Ambulatory Visit: Payer: Self-pay | Admitting: Hematology and Oncology

## 2021-06-11 MED ORDER — DIPHENOXYLATE-ATROPINE 2.5-0.025 MG PO TABS: 1.0000 | ORAL_TABLET | Freq: Four times a day (QID) | ORAL | 3 refills | Status: DC | PRN

## 2021-06-11 NOTE — Telephone Encounter (Signed)
This RN spoke with pt per her call stating increase in diarrhea with current Nerlynx dose not controlled with using the max amount of imodium.  Per Dr Lindi Adie prescription for lomotil sent to her pharmacy.  Pt verbalized understanding but is also " getting ready to leave to do a craft show in [place] "

## 2021-06-11 NOTE — Progress Notes (Signed)
Worsening diarrhea: I sent a prescription for Lomotil.  Instructed her to stop Nerlynx until we see her back.

## 2021-06-14 ENCOUNTER — Other Ambulatory Visit: Payer: Medicare Other

## 2021-06-14 ENCOUNTER — Ambulatory Visit: Payer: Medicare Other | Admitting: Pharmacist

## 2021-06-14 ENCOUNTER — Ambulatory Visit: Payer: Medicare Other

## 2021-06-15 ENCOUNTER — Inpatient Hospital Stay: Payer: 59 | Attending: Medical

## 2021-06-15 ENCOUNTER — Inpatient Hospital Stay: Payer: 59

## 2021-06-15 ENCOUNTER — Inpatient Hospital Stay: Payer: 59 | Admitting: Dietician

## 2021-06-15 ENCOUNTER — Inpatient Hospital Stay: Payer: 59 | Admitting: Pharmacist

## 2021-06-15 ENCOUNTER — Other Ambulatory Visit: Payer: Self-pay

## 2021-06-15 VITALS — BP 102/50 | HR 88 | Temp 97.5°F | Resp 16 | Ht 62.0 in | Wt 93.3 lb

## 2021-06-15 DIAGNOSIS — Z5112 Encounter for antineoplastic immunotherapy: Secondary | ICD-10-CM | POA: Diagnosis present

## 2021-06-15 DIAGNOSIS — T451X5A Adverse effect of antineoplastic and immunosuppressive drugs, initial encounter: Secondary | ICD-10-CM

## 2021-06-15 DIAGNOSIS — C787 Secondary malignant neoplasm of liver and intrahepatic bile duct: Secondary | ICD-10-CM | POA: Insufficient documentation

## 2021-06-15 DIAGNOSIS — C7931 Secondary malignant neoplasm of brain: Secondary | ICD-10-CM | POA: Insufficient documentation

## 2021-06-15 DIAGNOSIS — C50912 Malignant neoplasm of unspecified site of left female breast: Secondary | ICD-10-CM

## 2021-06-15 DIAGNOSIS — C50212 Malignant neoplasm of upper-inner quadrant of left female breast: Secondary | ICD-10-CM | POA: Insufficient documentation

## 2021-06-15 DIAGNOSIS — K521 Toxic gastroenteritis and colitis: Secondary | ICD-10-CM

## 2021-06-15 DIAGNOSIS — Z170 Estrogen receptor positive status [ER+]: Secondary | ICD-10-CM | POA: Diagnosis not present

## 2021-06-15 DIAGNOSIS — Z171 Estrogen receptor negative status [ER-]: Secondary | ICD-10-CM

## 2021-06-15 DIAGNOSIS — C7951 Secondary malignant neoplasm of bone: Secondary | ICD-10-CM | POA: Insufficient documentation

## 2021-06-15 DIAGNOSIS — Z452 Encounter for adjustment and management of vascular access device: Secondary | ICD-10-CM | POA: Insufficient documentation

## 2021-06-15 DIAGNOSIS — Z95828 Presence of other vascular implants and grafts: Secondary | ICD-10-CM

## 2021-06-15 LAB — CBC WITH DIFFERENTIAL (CANCER CENTER ONLY)
Abs Immature Granulocytes: 0.03 10*3/uL (ref 0.00–0.07)
Basophils Absolute: 0.1 10*3/uL (ref 0.0–0.1)
Basophils Relative: 1 %
Eosinophils Absolute: 1.1 10*3/uL — ABNORMAL HIGH (ref 0.0–0.5)
Eosinophils Relative: 8 %
HCT: 26.6 % — ABNORMAL LOW (ref 36.0–46.0)
Hemoglobin: 8.4 g/dL — ABNORMAL LOW (ref 12.0–15.0)
Immature Granulocytes: 0 %
Lymphocytes Relative: 9 %
Lymphs Abs: 1.3 10*3/uL (ref 0.7–4.0)
MCH: 26.7 pg (ref 26.0–34.0)
MCHC: 31.6 g/dL (ref 30.0–36.0)
MCV: 84.4 fL (ref 80.0–100.0)
Monocytes Absolute: 0.9 10*3/uL (ref 0.1–1.0)
Monocytes Relative: 6 %
Neutro Abs: 10.7 10*3/uL — ABNORMAL HIGH (ref 1.7–7.7)
Neutrophils Relative %: 76 %
Platelet Count: 379 10*3/uL (ref 150–400)
RBC: 3.15 MIL/uL — ABNORMAL LOW (ref 3.87–5.11)
RDW: 18.7 % — ABNORMAL HIGH (ref 11.5–15.5)
WBC Count: 14.1 10*3/uL — ABNORMAL HIGH (ref 4.0–10.5)
nRBC: 0 % (ref 0.0–0.2)

## 2021-06-15 LAB — CMP (CANCER CENTER ONLY)
ALT: 8 U/L (ref 0–44)
AST: 12 U/L — ABNORMAL LOW (ref 15–41)
Albumin: 2.7 g/dL — ABNORMAL LOW (ref 3.5–5.0)
Alkaline Phosphatase: 100 U/L (ref 38–126)
Anion gap: 8 (ref 5–15)
BUN: 9 mg/dL (ref 6–20)
CO2: 24 mmol/L (ref 22–32)
Calcium: 8.7 mg/dL — ABNORMAL LOW (ref 8.9–10.3)
Chloride: 107 mmol/L (ref 98–111)
Creatinine: 0.76 mg/dL (ref 0.44–1.00)
GFR, Estimated: >60 mL/min (ref >60)
Glucose, Bld: 96 mg/dL (ref 70–99)
Potassium: 4 mmol/L (ref 3.5–5.1)
Sodium: 139 mmol/L (ref 135–145)
Total Bilirubin: <0.2 mg/dL — ABNORMAL LOW (ref 0.3–1.2)
Total Protein: 6.9 g/dL (ref 6.5–8.1)

## 2021-06-15 MED ORDER — TRASTUZUMAB-ANNS CHEMO 150 MG IV SOLR
6.0000 mg/kg | Freq: Once | INTRAVENOUS | Status: AC
  Administered 2021-06-15: 273 mg via INTRAVENOUS
  Filled 2021-06-15: qty 13

## 2021-06-15 MED ORDER — ACETAMINOPHEN 325 MG PO TABS
650.0000 mg | ORAL_TABLET | Freq: Once | ORAL | Status: AC
  Administered 2021-06-15: 650 mg via ORAL
  Filled 2021-06-15: qty 2

## 2021-06-15 MED ORDER — HEPARIN SOD (PORK) LOCK FLUSH 100 UNIT/ML IV SOLN
500.0000 [IU] | Freq: Once | INTRAVENOUS | Status: AC | PRN
  Administered 2021-06-15: 500 [IU]

## 2021-06-15 MED ORDER — SODIUM CHLORIDE 0.9% FLUSH
10.0000 mL | INTRAVENOUS | Status: DC | PRN
  Administered 2021-06-15: 10 mL

## 2021-06-15 MED ORDER — DIPHENHYDRAMINE HCL 25 MG PO CAPS
25.0000 mg | ORAL_CAPSULE | Freq: Once | ORAL | Status: AC
  Administered 2021-06-15: 25 mg via ORAL
  Filled 2021-06-15: qty 1

## 2021-06-15 MED ORDER — SODIUM CHLORIDE 0.9 % IV SOLN: Freq: Once | INTRAVENOUS | Status: AC

## 2021-06-15 NOTE — Progress Notes (Signed)
Nutrition Follow-up:  Patient receiving Herceptin, neratinib for metastatic breast cancer.   Met with patient in waiting area prior to infusion. Patient reports ongoing significant diarrhea with reduced dose neratinib. Patient reports loss of appetite and unable to maintain nutrition due to diarrhea. Patient reports she stopped taking on her own due to side effects on 06/09/21. Patient reports her appetite has improved and eating "whatever she wants" Patient has resumed drinking [employer] 1.4 daily. Patient has switched to [employer] lactose free whole milk, reports tolerating this well.    Medications: reviewed  Labs: reviewed  Anthropometrics: Weight 93 lb 4.8 oz today stable x 3 weeks. Patient weighed 93 lb 12.8 oz on 11/14.   10/24 - 96 lb 6.4 oz  10/3 - 97 lb   NUTRITION DIAGNOSIS: Inadequate oral intake improving   INTERVENTION:  Encouraged small meals and snacks q2 hours Encouraged high calorie, high protein foods to promote weight gain - shake recipes provided  Continue drinking [employer] 1.4 daily Suggested mixing fairlife milk with CIB powder for additional supplement - sample of CIB powder provided Discussed strategies for diarrhea - handout provided    MONITORING, EVALUATION, GOAL: weight trends, intake    NEXT VISIT: Tuesday January 17 during infusion

## 2021-06-15 NOTE — Patient Instructions (Signed)
[facility]  Discharge Instructions: ?Thank you for choosing [facility] to provide your oncology and hematology care.  ? ?If you have a lab appointment with the Cancer Center, please go directly to the Cancer Center and check in at the registration area. ?  ?Wear comfortable clothing and clothing appropriate for easy access to any Portacath or PICC line.  ? ?We strive to give you quality time with your provider. You may need to reschedule your appointment if you arrive late (15 or more minutes).  Arriving late affects you and other patients whose appointments are after yours.  Also, if you miss three or more appointments without notifying the office, you may be dismissed from the clinic at the provider?s discretion.    ?  ?For prescription refill requests, have your pharmacy contact our office and allow 72 hours for refills to be completed.   ? ?Today you received the following chemotherapy and/or immunotherapy agents: Trastuzumab    ?  ?To help prevent nausea and vomiting after your treatment, we encourage you to take your nausea medication as directed. ? ?BELOW ARE SYMPTOMS THAT SHOULD BE REPORTED IMMEDIATELY: ?*FEVER GREATER THAN 100.4 F (38 ?C) OR HIGHER ?*CHILLS OR SWEATING ?*NAUSEA AND VOMITING THAT IS NOT CONTROLLED WITH YOUR NAUSEA MEDICATION ?*UNUSUAL SHORTNESS OF BREATH ?*UNUSUAL BRUISING OR BLEEDING ?*URINARY PROBLEMS (pain or burning when urinating, or frequent urination) ?*BOWEL PROBLEMS (unusual diarrhea, constipation, pain near the anus) ?TENDERNESS IN MOUTH AND THROAT WITH OR WITHOUT PRESENCE OF ULCERS (sore throat, sores in mouth, or a toothache) ?UNUSUAL RASH, SWELLING OR PAIN  ?UNUSUAL VAGINAL DISCHARGE OR ITCHING  ? ?Items with * indicate a potential emergency and should be followed up as soon as possible or go to the Emergency Department if any problems should occur. ? ?Please show the CHEMOTHERAPY ALERT CARD or IMMUNOTHERAPY ALERT CARD at check-in  to the Emergency Department and triage nurse. ? ?Should you have questions after your visit or need to cancel or reschedule your appointment, please contact [facility]  Dept: [phone]  and follow the prompts.  Office hours are 8:00 a.m. to 4:30 p.m. Monday - Friday. Please note that voicemails left after 4:00 p.m. may not be returned until the following business day.  We are closed weekends and major holidays. You have access to a nurse at all times for urgent questions. Please call the main number to the clinic Dept: [phone] and follow the prompts. ? ? ?For any non-urgent questions, you may also contact your provider using MyChart. We now offer e-Visits for anyone 18 and older to request care online for non-urgent symptoms. For details visit [email or web address]. ?  ?Also download the MyChart app! Go to the app store, search "MyChart", open the app, select [facility], and log in with your MyChart username and password. ? ?Due to Covid, a mask is required upon entering the hospital/clinic. If you do not have a mask, one will be given to you upon arrival. For doctor visits, patients may have 1 support person aged 18 or older with them. For treatment visits, patients cannot have anyone with them due to current Covid guidelines and our immunocompromised population.  ? ?

## 2021-06-15 NOTE — Progress Notes (Signed)
Last echo 01/27/21.  Ok to proceed with herceptin per Dr. Lindi Adie.

## 2021-06-15 NOTE — Progress Notes (Signed)
Melissa [name]       Telephone: [phone]?Fax: [phone]   Oncology Clinical Pharmacist Practitioner Progress Note  Melissa [name] was contacted via in-person visit to discuss her chemotherapy regimen for neratinib which they receive under the care of Dr. Nicholas Lose.   Current treatment regimen and start date Neratinib (04/26/21) / trastuzumab (06/15/20)  Interval History She continues on neratinib 120 mg by mouth  daily on days 1 to 28 of a 28-day cycle. This is being given  in combination with trastuzumab every 21 days . Therapy is planned to continue until disease progression or unacceptable toxicity.   Response to Therapy Melissa [name] had contacted Dr. Geralyn Flash office last week reporting that the diarrhea that had been controlled by the loperamide was no longer working despite taking 8 tabs loperamide in a 24 hour period.  Dr. Lindi Adie prescribed diphenoxylate/atropine (Lomotil) at that time and told Melissa [name] to stop neratinib until visiting with clinical pharmacy today.  She normally does not take neratinib and trastuzumab on the same day as she has in the past felt sick afterwards. Her last dose of neratinib was on 06/09/21.  She continued to have 4-5 loose stools on Thursday and Friday of last week and then had a partially formed stool on Sunday 06/13/21. We discussed that the manufacturer guidelines for neratinib recommend stopping neratinib if 3 tablets is not tolerated. However, Dr. Lindi Adie may consider lowering to 2 tablets of neratinib daily because of the potential interaction with voriconazole and the possible inhibition of neratinib metabolism previously discussed.  We discussed continuing to hold neratinib for another week and she was agreeable to this.  She will tentatively restart neratinib at 3 tablets daily on 06/23/21 and at the same time take diphenoxylate/atropine every 6 hours as needed.  Melissa [name] will contact the clinic on Friday (at the latest) 06/25/21 if  the diarrhea persists with diphenoxylate/atropine. Her only other side effect is weight loss because everything she eats, she feels "goes right through her". She has lost 2 pounds since her last visit. We discussed the option of increasing the meal supplement shakes to 3 times daily which she will consider. Melissa [name] states she usually meets with the dietitian Melissa [name] when receiving her trastuzumab infusions which has also been helpful. She also continues to see ID who manages her voriconazole.   Explained that clinical pharmacy will contact her on 06/28/21 to assess tolerability of diphenoxylate/atropine and neratinib. Will add a standing magnesium lab for now until diarrhea improves. She will have telephone visit with clinical pharmacy on 06/28/21 and visit on 07/06/21. These have been added. She will see Dr. Lindi Adie with labs on 07/27/21. Labs, vitals, treatment parameters, and manufacturer guidelines assessing toxicity were reviewed with Melissa [name] today. Based on these values, patient is in agreement to HOLD therapy at this time. Restart tentative 06/23/21.  Allergies Allergies  Allergen Reactions   Aspirin Anaphylaxis and Shortness Of Breath    THROAT CLOSES   Protonix [Pantoprazole] Nausea Only and Other (See Comments)    Also caused a "film in the mouth" and caused chest pressure   Iodinated Diagnostic Agents Rash    Patient allergic to contrast used in radiation oncology for CT simulation     Vitals Vitals with BMI 06/15/2021 05/24/2021 05/24/2021  Height 5\' 2"  5\' 2"  5\' 2"   Weight 93 lbs 5 oz 95 lbs 3 oz 93 lbs 13 oz  BMI 17.06 80.99 83.38  Systolic 250 539 767  Diastolic 50 75 64  Pulse 88 85 70     Laboratory Data CBC EXTENDED Latest Ref Rng & Units 06/15/2021 05/24/2021 05/19/2021  WBC 4.0 - 10.5 K/uL 14.1(H) 12.2(H) 12.2(H)  RBC 3.87 - 5.11 MIL/uL 3.15(L) 3.29(L) 3.69(L)  HGB 12.0 - 15.0 g/dL 8.4(L) 9.0(L) 9.8(L)  HCT 36.0 - 46.0 % 26.6(L) 27.2(L) 30.0(L)  PLT 150 - 400  K/uL 379 422(H) 462(H)  NEUTROABS 1.7 - 7.7 K/uL 10.7(H) 8.0(H) 8.0(H)  LYMPHSABS 0.7 - 4.0 K/uL 1.3 1.6 1.7    CMP Latest Ref Rng & Units 05/24/2021 05/19/2021 05/03/2021  Glucose 70 - 99 mg/dL 75 82 97  BUN 6 - 20 mg/dL 9 9 13   Creatinine 0.44 - 1.00 mg/dL 0.67 0.78 0.76  Sodium 135 - 145 mmol/L 140 135 135  Potassium 3.5 - 5.1 mmol/L 4.2 4.1 3.9  Chloride 98 - 111 mmol/L 108 102 102  CO2 22 - 32 mmol/L 24 24 25   Calcium 8.9 - 10.3 mg/dL 8.9 9.3 9.3  Total Protein 6.5 - 8.1 g/dL 7.0 7.5 7.5  Total Bilirubin 0.3 - 1.2 mg/dL <0.2(L) <0.2(L) 0.3  Alkaline Phos 38 - 126 U/L 108 137(H) 104  AST 15 - 41 U/L 14(L) 16 17  ALT 0 - 44 U/L 8 9 9     Lab Results  Component Value Date   MG 1.9 05/19/2021     Adverse Effects Assessment Diarrhea: was controlled on loperamide then became refractory. Dr. Lindi Adie prescribed diphenoxylate/atropine which she will start as needed when starting neratinib back on 06/23/21 Weight loss: will increase to 3 times per day meal supplement shakes.  Continues to see dietitian  Adherence Assessment Melissa [name] reports missing 0 doses over the past 4 weeks related to adherence. Stopped neratinib per Dr. Geralyn Flash instruction last dose 06/09/21   Reason for missed dose: diarrhea Patient was re-educated on importance of adherence.   Access Assessment Melissa [name] is currently receiving her neratinib through  [employer] concerns:  none  Medication Reconciliation The patient's medication list was reviewed today with the patient? No New medications or herbal supplements have recently been started? Yes , diphenoxylate/atropine prescribed, not yet started Any medications have been discontinued? No  The medication list was updated and reconciled based on the patient's most recent medication list in the electronic medical record (EMR) including herbal products and OTC medications.   Medications Current Outpatient Medications  Medication Sig Dispense  Refill   albuterol (VENTOLIN HFA) 108 (90 Base) MCG/ACT inhaler Inhale 2 puffs into the lungs every 6 (six) hours as needed. 8 g 5   budesonide-formoterol (SYMBICORT) 160-4.5 MCG/ACT inhaler Inhale 2 puffs into the lungs 2 (two) times daily. 3 each 3   chlorpheniramine-HYDROcodone (TUSSIONEX) 10-8 MG/5ML SUER Take 5 mLs by mouth at bedtime. 115 mL 0   diphenoxylate-atropine (LOMOTIL) 2.5-0.025 MG tablet Take 1 tablet by mouth 4 (four) times daily as needed for diarrhea or loose stools. 60 tablet 3   ELDERBERRY PO Take by mouth daily. Per patient, takes one daily (Patient not taking: No sig reported)     levalbuterol (XOPENEX) 0.63 MG/3ML nebulizer solution Take 3 mLs (0.63 mg total) by nebulization every 4 (four) hours as needed for wheezing or shortness of breath. (Patient not taking: No sig reported) 540 mL 6   lidocaine-prilocaine (EMLA) cream APPLY EXTERNALLY TO THE AFFECTED AREA 1 TIME 30 g 3   megestrol (MEGACE) 20 MG tablet Take 1 tablet (20 mg total) by mouth daily. (  Patient not taking: No sig reported) 30 tablet 3   naproxen sodium (ALEVE) 220 MG tablet Take 440 mg by mouth 2 (two) times daily as needed (headache).      Neratinib Maleate (NERLYNX) 40 MG tablet Take 3 tablets (120 mg total) by mouth daily. Take with food. 180 tablet 3   predniSONE (DELTASONE) 50 MG tablet 1 tab 12 hours before scan, 1 tab 7 hours before and 1 tab 1 hour before 3 tablet 1   voriconazole (VFEND) 200 MG tablet Take 1 tablet (200 mg total) by mouth 2 (two) times daily. 60 tablet 11   No current facility-administered medications for this visit.    Drug-Drug Interactions (DDIs) DDIs were evaluated? Yes Significant DDIs?  Previously discussed potential neratinib / voriconazole interaction. Neratinib reduced per Dr. Lindi Adie The patient was instructed to speak with their health care provider and/or the oral chemotherapy pharmacist before starting any new drug, including prescription or over the counter, natural /  herbal products, or vitamins.  Supportive Care Start diphenoxylate/atropine every 6 hours as needed when starting back neratinib Increase meal supplement shakes as above  Dosing Assessment Hepatic adjustments needed? No  Renal adjustments needed? No  Toxicity adjustments needed?  HOLDING neratinib for now until 06/23/21 tentatively The current dosing regimen is not appropriate to continue at this time. May restart on 06/23/21 if tolerated  Follow-Up Plan Continue to HOLD neratinib, may restart on 06/23/21 if tolerated Start diphenoxylate/atropine on 06/23/21 every 6 hours as needed for diarrhea Melissa [name] will contact the clinic on Firday 06/25/21 if continued diarrhea to discuss other options with Dr. Lindi Adie Clinical pharmacy will contact Melissa [name] on 06/28/21 to discuss tolerability and see her in pharmacy clinic on 07/06/21 with labs and trastuzumab  Melissa [name] participated in the discussion, expressed understanding, and voiced agreement with the above plan. All questions were answered to her satisfaction. The patient was advised to contact the clinic at [phone] with any questions or concerns prior to her return visit.   I spent 30 minutes assessing and educating the patient.  Raina Mina, RPH-CPP, 06/15/2021  2:02 PM

## 2021-06-17 ENCOUNTER — Ambulatory Visit
Admission: RE | Admit: 2021-06-17 | Discharge: 2021-06-17 | Disposition: A | Discharge status: Home / Self Care | Payer: 59 | Source: Ambulatory Visit | Attending: Radiation Oncology | Admitting: Radiation Oncology

## 2021-06-17 DIAGNOSIS — C7949 Secondary malignant neoplasm of other parts of nervous system: Secondary | ICD-10-CM

## 2021-06-17 DIAGNOSIS — C7931 Secondary malignant neoplasm of brain: Secondary | ICD-10-CM

## 2021-06-17 MED ORDER — HEPARIN SOD (PORK) LOCK FLUSH 100 UNIT/ML IV SOLN
500.0000 [IU] | Freq: Once | INTRAVENOUS | Status: AC
  Administered 2021-06-17: 500 [IU] via INTRAVENOUS

## 2021-06-17 MED ORDER — GADOBENATE DIMEGLUMINE 529 MG/ML IV SOLN
8.0000 mL | Freq: Once | INTRAVENOUS | Status: AC | PRN
  Administered 2021-06-17: 8 mL via INTRAVENOUS

## 2021-06-17 MED ORDER — SODIUM CHLORIDE 0.9% FLUSH: 10.0000 mL | INTRAVENOUS | Status: DC | PRN

## 2021-06-21 ENCOUNTER — Encounter: Payer: Self-pay | Admitting: Urology

## 2021-06-21 NOTE — Progress Notes (Signed)
Patient states doing well. No symptoms reported at this time. Meaningful use complete.   Patient notified of 8:30am-06/23/21 telephone appointment and states that "She would like to come in-person for this appointment, to discuss new results on most recent brain MRI". I told her that I would have to clear this w/ Ashlyn Bruning PA-C but that we would do our best to appease her according to schedule.  Patient verbalized understanding of all information given. Patient preferred contact [phone]

## 2021-06-23 ENCOUNTER — Other Ambulatory Visit: Payer: Self-pay

## 2021-06-23 ENCOUNTER — Ambulatory Visit
Admission: RE | Admit: 2021-06-23 | Discharge: 2021-06-23 | Disposition: A | Discharge status: Home / Self Care | Payer: 59 | Source: Ambulatory Visit | Attending: Radiation Oncology | Admitting: Radiation Oncology

## 2021-06-23 ENCOUNTER — Other Ambulatory Visit (HOSPITAL_COMMUNITY): Payer: Self-pay

## 2021-06-23 DIAGNOSIS — C7931 Secondary malignant neoplasm of brain: Secondary | ICD-10-CM | POA: Insufficient documentation

## 2021-06-23 NOTE — Progress Notes (Addendum)
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 Brain metastasis (HCC)  C79.31       Interval Since Last Radiation:  3 months  Narrative:  The patient returns today for routine follow-up.  She had brain MRI which shows 2 new metastases.  She is asymptomatic.                              ALLERGIES:  is allergic to aspirin, protonix [pantoprazole], and iodinated diagnostic agents.  Meds: Current Outpatient Medications  Medication Sig Dispense Refill   albuterol (VENTOLIN HFA) 108 (90 Base) MCG/ACT inhaler Inhale 2 puffs into the lungs every 6 (six) hours as needed. 8 g 5   budesonide-formoterol (SYMBICORT) 160-4.5 MCG/ACT inhaler Inhale 2 puffs into the lungs 2 (two) times daily. 3 each 3   chlorpheniramine-HYDROcodone (TUSSIONEX) 10-8 MG/5ML SUER Take 5 mLs by mouth at bedtime. 115 mL 0   diphenoxylate-atropine (LOMOTIL) 2.5-0.025 MG tablet Take 1 tablet by mouth 4 (four) times daily as needed for diarrhea or loose stools. 60 tablet 3   ELDERBERRY PO Take by mouth daily. Per patient, takes one daily (Patient not taking: No sig reported)     levalbuterol (XOPENEX) 0.63 MG/3ML nebulizer solution Take 3 mLs (0.63 mg total) by nebulization every 4 (four) hours as needed for wheezing or shortness of breath. (Patient not taking: No sig reported) 540 mL 6   lidocaine-prilocaine (EMLA) cream APPLY EXTERNALLY TO THE AFFECTED AREA 1 TIME 30 g 3   megestrol (MEGACE) 20 MG tablet Take 1 tablet (20 mg total) by mouth daily. (Patient not taking: No sig reported) 30 tablet 3   naproxen sodium (ALEVE) 220 MG tablet Take 440 mg by mouth 2 (two) times daily as  needed (headache).      Neratinib Maleate (NERLYNX) 40 MG tablet Take 3 tablets (120 mg total) by mouth daily. Take with food. 180 tablet 3   predniSONE (DELTASONE) 50 MG tablet 1 tab 12 hours before scan, 1 tab 7 hours before and 1 tab 1 hour before 3 tablet 1   voriconazole (VFEND) 200 MG tablet Take 1 tablet (200 mg total) by mouth 2 (two) times daily. 60 tablet 11   No current facility-administered medications for this encounter.    Physical Findings: The patient is in no acute distress. Patient is alert and oriented.  vitals were not taken for this visit. .  No significant changes.  Lab Findings: Lab Results  Component Value Date   WBC 14.1 (H) 06/15/2021   WBC 7.2 05/05/2020   HGB 8.4 (L) 06/15/2021   HGB 13.1 08/12/2014   HCT 26.6 (L) 06/15/2021   HCT 40.4 08/12/2014   PLT 379 06/15/2021   PLT 228 08/12/2014    Lab Results  Component Value Date   NA 139 06/15/2021   NA 142 08/12/2014   K 4.0 06/15/2021   K 4.6 08/12/2014   CHLORIDE 107 08/12/2014   CO2 24 06/15/2021   CO2 26 08/12/2014   GLUCOSE 96 06/15/2021   GLUCOSE  94 08/12/2014   GLUCOSE 98 12/20/2012   BUN 9 06/15/2021   BUN 10.1 08/12/2014   CREATININE 0.76 06/15/2021   CREATININE 0.88 03/11/2019   CREATININE 0.9 08/12/2014   BILITOT <0.2 (L) 06/15/2021   BILITOT 0.25 08/12/2014   ALKPHOS 100 06/15/2021   ALKPHOS 68 08/12/2014   AST 12 (L) 06/15/2021   AST 15 08/12/2014   ALT 8 06/15/2021   ALT 11 08/12/2014   PROT 6.9 06/15/2021   PROT 7.1 08/12/2014   ALBUMIN 2.7 (L) 06/15/2021   ALBUMIN 3.8 08/12/2014   CALCIUM 8.7 (L) 06/15/2021   CALCIUM 9.3 08/12/2014   ANIONGAP 8 06/15/2021    Radiographic Findings: MR Brain W Wo Contrast  Result Date: 06/17/2021 CLINICAL DATA:  Brain/central nervous system neoplasm, monitor 3T SRS. Follow-up of treated metastatic breast cancer. History of craniotomy in January 2019. EXAM: MRI HEAD WITHOUT AND WITH CONTRAST TECHNIQUE: Multiplanar, multiecho pulse  sequences of the brain and surrounding structures were obtained without and with intravenous contrast. CONTRAST:  25m MULTIHANCE GADOBENATE DIMEGLUMINE 529 MG/ML IV SOLN Contrast was administered via a port which was accessed by a [employer]Equ[employer] trader COMPARISON:  MR head 03/05/2021. FINDINGS: Brain: New lesions: Left occipital lobe 5 mm (series 12, image 66). Larger lesions: Left occipital lobe 4 mm, previously punctate (series 12, image 53) Smaller lesions: Right parasagittal frontal 8 mm, previously 15 mm (series 12, image 129) Right anterior temporal 9 mm, previously 13 mm (series 12, image 42). ' Left cerebellar punctate lesion (series 12, image 28) Stable lesions: Left occipital 4 mm (series 12, image 71) Irregular enhancement along the right frontal resection cavity. Irregular lesion of the inferior right frontal lobe (series 12, image 70) Right anterior temporal edema has resolved. Decreased high right parasagittal frontal edema. There is stable T2 hyperintensity in the anterior and inferior frontal lobe. No acute infarction.  Ventricles are stable in size. Vascular: Major vessel flow voids at the skull base are preserved. Skull and upper cervical spine: Normal marrow signal is preserved. Sinuses/Orbits: Paranasal sinuses are aerated. Orbits are unremarkable. Other: Sella is unremarkable.  Mastoid air cells are clear. IMPRESSION: New and larger left occipital lesions detailed above. Interval treated 3 lesions have decreased in size with improvement in edema. Electronically Signed   By: PMacy MisM.D.   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  In reviewing the imaging findings, the clinical history [facility] these new lesions are more  consistent with breast cancer metastases.  At this point, the patient would potentially benefit from radiotherapy. The options include whole brain irradiation versus stereotactic radiosurgery. There are pros and cons associated with each of these potential treatment options. Whole brain radiotherapy would treat the known metastatic deposits and help provide some reduction of risk for future brain metastases. However, whole brain radiotherapy carries potential risks including hair loss, subacute somnolence, and neurocognitive changes including a possible reduction in short-term memory. Whole brain radiotherapy also may carry a lower likelihood of tumor control at the treatment sites because of the low-dose used. Stereotactic radiosurgery carries a higher likelihood for local tumor control at the targeted sites with lower associated risk for neurocognitive changes such as memory loss. However, the use of stereotactic  radiosurgery in this setting may leave the patient at increased risk for new brain metastases elsewhere in the brain as high as 50-60%. Accordingly, patients who receive stereotactic radiosurgery in this setting should undergo ongoing surveillance imaging with brain MRI more frequently in order to identify and treat new small brain metastases before they become symptomatic. Stereotactic radiosurgery does carry some different risks, including a risk of radionecrosis.  PLAN: Today, I reviewed the findings and workup thus far with the patient. We discussed the dilemma regarding whole brain radiotherapy versus stereotactic radiosurgery. We discussed the pros and cons of each. We also discussed the logistics and delivery of each. We reviewed the results associated with each of the treatments described above. The patient seems to understand the treatment options and would like to proceed with stereotactic radiosurgery.  I personally spent 20 minutes in this encounter including chart review, reviewing  radiological studies, meeting face-to-face with the patient, entering orders and completing documentation.    _____________________________________  Sheral Apley Tammi Klippel, M.D.

## 2021-06-23 NOTE — Progress Notes (Signed)
Radiation Oncology         (336) [phone] ________________________________  Name: Melissa [name] MRN: [id number]  Date: 06/23/2021  DOB: May 28, 1966  SIMULATION AND TREATMENT PLANNING NOTE    ICD-10-CM   1. 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  Identity was confirmed.  All relevant records and images related to the planned course of therapy were reviewed.  The patient freely provided informed written consent to proceed with treatment after reviewing the details related to the planned course of therapy. The consent form was witnessed and verified by the simulation staff. Intravenous access was established for contrast administration. Then, the patient was set-up in a stable reproducible supine position for radiation therapy.  A relocatable thermoplastic stereotactic head frame was fabricated for precise immobilization.  CT images were obtained.  Surface markings were placed.  The CT images were loaded into the planning software and fused with the patient's targeting MRI scan.  Then the target and avoidance structures were contoured.  Treatment planning then occurred.  The radiation prescription was entered and confirmed.  I have requested 3D planning  I have requested a DVH of the following structures: Brain stem, brain, left eye, right eye, lenses, optic chiasm, target volumes, uninvolved brain, and normal tissue.    SPECIAL TREATMENT PROCEDURE:  The planned course of therapy using radiation constitutes a special treatment procedure. Special care is required in the management of this patient for the following reasons. This treatment constitutes a Special Treatment Procedure for the following reason: High dose per fraction requiring special monitoring for increased toxicities of treatment  including daily imaging.  The special nature of the planned course of radiotherapy will require increased physician supervision and oversight to ensure patient's safety with optimal treatment outcomes.  This requires extended time and effort.  PLAN:  The patient will receive 20 Gy in 1 fraction.  ________________________________  Sheral Apley Tammi Klippel, M.D.

## 2021-06-23 NOTE — Addendum Note (Signed)
Encounter addended by: Tyler Pita, MD on: 06/23/2021 3:25 PM  Actions taken: Medication List reviewed, Problem List reviewed, Allergies reviewed, Level of Service modified, Clinical Note Signed

## 2021-06-24 ENCOUNTER — Other Ambulatory Visit: Payer: Self-pay | Admitting: Hematology and Oncology

## 2021-06-24 ENCOUNTER — Other Ambulatory Visit (HOSPITAL_COMMUNITY): Payer: Self-pay

## 2021-06-24 DIAGNOSIS — C7931 Secondary malignant neoplasm of brain: Secondary | ICD-10-CM

## 2021-06-24 DIAGNOSIS — Z171 Estrogen receptor negative status [ER-]: Secondary | ICD-10-CM

## 2021-06-25 DIAGNOSIS — C7931 Secondary malignant neoplasm of brain: Secondary | ICD-10-CM | POA: Diagnosis not present

## 2021-06-25 MED ORDER — HYDROCOD POLST-CPM POLST ER 10-8 MG/5ML PO SUER: 5.0000 mL | Freq: Every day | ORAL | 0 refills | Status: DC

## 2021-06-28 ENCOUNTER — Inpatient Hospital Stay: Payer: 59 | Admitting: Pharmacist

## 2021-06-28 DIAGNOSIS — Z171 Estrogen receptor negative status [ER-]: Secondary | ICD-10-CM

## 2021-06-28 NOTE — Progress Notes (Signed)
[place]       Telephone: [phone]?Fax: [phone]   Oncology Clinical Pharmacist Practitioner Progress Note  Melissa [name] was contacted via telephone visit to discuss her chemotherapy regimen for neratinib which they receive under the care of Dr. Nicholas Lose.   Current treatment regimen and start date - Neratinib (04/26/21) - Trastuzumab (06/15/20)  Interval History She last spoke to clinical pharmacy on 06/15/21 and had been holding neratinib d/t diarrhea since 06/09/21.  On 06/23/21 she restarted neratinib at 3 tablets as planned and soon thereafter starting having diarrhea despite starting diphenoxylate/atropine as needed. She then stopped the neratinib on Friday 06/25/21 and has been off since then.  She also had a MRI Brain done on 12/8 that showed new and larger left occipital lesions and interval treated 3 lesions had decreased in size with improvement in edema. She is having SRS tomorrow on these lesions. Dr. Lindi Adie has ordered CT CAP for 07/23/20. Not scheduled as of today's date. Nursing made aware and will check on authorization. She is scheduled for labs, pharmacy visit, and trastuzumab on 07/06/21.  Discussed plan with Dr. Lindi Adie and plan will be to decrease to neratinib 1 tablet if tolerated after pharmacy visit on 07/06/21.  Melissa [name] unfortunately has some underlying respiratory issues including an aspergillus infection (currently following with ID) and a left lobectomy in 1997 which could make switching to other agents such as fam-trastuzumab deruxtecan-nxki difficult with it's known side effect risk of ILD/pneumonitis. She will continue trastuzumab for now and have restaging scans as directed by Dr. Lindi Adie. She has a planned visit with him on 07/27/21.  Response to Therapy As above, continued diarrhea on 3 tabs. Will see Melissa [name] on 07/06/21 with labs and trastuzumab infusion. Continue to hold neratinib until that time.  Telephone visit today, labs and  vitals were not done due to type of encounter.  Allergies Allergies  Allergen Reactions   Aspirin Anaphylaxis and Shortness Of Breath    THROAT CLOSES   Protonix [Pantoprazole] Nausea Only and Other (See Comments)    Also caused a "film in the mouth" and caused chest pressure   Iodinated Diagnostic Agents Rash    Patient allergic to contrast used in radiation oncology for CT simulation     Adverse Effects Assessment Diarrhea: started fairly quickly after restarting neratinib at the lower 3 tablets daily dose. Will hold until next visit on 07/06/21 Diarrhea has resolved as of yesterday evening  Adherence Assessment Melissa [name] reports missing 0 doses over the past 2 weeks due to adherence. Held as above d/t toxicity.   Reason for missed dose: toxicity Patient was re-educated on importance of adherence.   Access Assessment Melissa [name] is currently receiving her neratinib through  [employer] concerns:  none  Medication Reconciliation The patient's medication list was reviewed today with the patient? No New medications or herbal supplements have recently been started? No  Any medications have been discontinued? No  The medication list was updated and reconciled based on the patient's most recent medication list in the electronic medical record (EMR) including herbal products and OTC medications.   Medications Current Outpatient Medications  Medication Sig Dispense Refill   albuterol (VENTOLIN HFA) 108 (90 Base) MCG/ACT inhaler Inhale 2 puffs into the lungs every 6 (six) hours as needed. 8 g 5   budesonide-formoterol (SYMBICORT) 160-4.5 MCG/ACT inhaler Inhale 2 puffs into the lungs 2 (two) times daily. 3 each 3   chlorpheniramine-HYDROcodone (  TUSSIONEX) 10-8 MG/5ML SUER Take 5 mLs by mouth at bedtime. 115 mL 0   diphenoxylate-atropine (LOMOTIL) 2.5-0.025 MG tablet Take 1 tablet by mouth 4 (four) times daily as needed for diarrhea or loose stools. 60 tablet 3    ELDERBERRY PO Take by mouth daily. Per patient, takes one daily (Patient not taking: No sig reported)     levalbuterol (XOPENEX) 0.63 MG/3ML nebulizer solution Take 3 mLs (0.63 mg total) by nebulization every 4 (four) hours as needed for wheezing or shortness of breath. (Patient not taking: No sig reported) 540 mL 6   lidocaine-prilocaine (EMLA) cream APPLY EXTERNALLY TO THE AFFECTED AREA 1 TIME 30 g 3   megestrol (MEGACE) 20 MG tablet Take 1 tablet (20 mg total) by mouth daily. (Patient not taking: No sig reported) 30 tablet 3   naproxen sodium (ALEVE) 220 MG tablet Take 440 mg by mouth 2 (two) times daily as needed (headache).      Neratinib Maleate (NERLYNX) 40 MG tablet Take 3 tablets (120 mg total) by mouth daily. Take with food. 180 tablet 3   predniSONE (DELTASONE) 50 MG tablet 1 tab 12 hours before scan, 1 tab 7 hours before and 1 tab 1 hour before 3 tablet 1   voriconazole (VFEND) 200 MG tablet Take 1 tablet (200 mg total) by mouth 2 (two) times daily. 60 tablet 11   No current facility-administered medications for this visit.   Drug-Drug Interactions (DDIs) DDIs were evaluated? Yes Significant DDIs? Yes , as discussed prior, potential interaction with voriconazole. Neratinib had been reduced due to this potential interaction The patient was instructed to speak with their health care provider and/or the oral chemotherapy pharmacist before starting any new drug, including prescription or over the counter, natural / herbal products, or vitamins.  Supportive Care Neratinib held since 06/25/21 due to continued diarrhea on 3 tablets  Dosing Assessment Hepatic adjustments needed? No  Renal adjustments needed? No  Toxicity adjustments needed? Yes  The current dosing regimen is not appropriate to continue at this time.  Follow-Up Plan Continue to hold neratinib. Will discuss Dr. Geralyn Flash plan with Melissa [name] on 07/06/21.  [facility] tomorrow 06/29/21 Labs / pharmacy visit / trastuzumab on  07/06/21 Restaging scans per Dr. Lindi Adie for 07/22/21. These were ordered but not scheduled currently. Nursing requesting authorization Tentatively see Dr. Lindi Adie with labs and trastuzumab on 07/27/21  Melissa [name] participated in the discussion, expressed understanding, and voiced agreement with the above plan. All questions were answered to her satisfaction. The patient was advised to contact the clinic at [phone] with any questions or concerns prior to her return visit.   I spent 15 minutes assessing and educating the patient.  Raina Mina, RPH-CPP, 06/28/2021  3:42 PM

## 2021-06-29 ENCOUNTER — Other Ambulatory Visit: Payer: Self-pay

## 2021-06-29 ENCOUNTER — Encounter: Payer: Self-pay | Admitting: Radiation Oncology

## 2021-06-29 ENCOUNTER — Ambulatory Visit
Admission: RE | Admit: 2021-06-29 | Discharge: 2021-06-29 | Disposition: A | Discharge status: Home / Self Care | Payer: 59 | Source: Ambulatory Visit | Attending: Radiation Oncology | Admitting: Radiation Oncology

## 2021-06-29 DIAGNOSIS — C7931 Secondary malignant neoplasm of brain: Secondary | ICD-10-CM | POA: Diagnosis not present

## 2021-06-29 NOTE — Progress Notes (Signed)
Melissa [name] rested with [name] for 30 minutes following her [place] treatment.  Patient denies headache, dizziness, nausea, diplopia or ringing in the ears. Denies fatigue. Patient without complaints. Understands to avoid strenuous activity for the next 24 hours and call [phone] with needs.   BP 109/68 (BP Location: Right Arm, Patient Position: Sitting, Cuff Size: Normal)    Pulse 84    Temp 97.9 F (36.6 C)    Resp 20    LMP 07/25/2012 Comment: pregnancy waiver form signed 01-18-2018   SpO2 100%    Melissa [name], BSN

## 2021-06-30 ENCOUNTER — Encounter: Payer: Self-pay | Admitting: Adult Health

## 2021-07-06 ENCOUNTER — Other Ambulatory Visit: Payer: Self-pay | Admitting: Hematology and Oncology

## 2021-07-06 ENCOUNTER — Other Ambulatory Visit: Payer: Self-pay

## 2021-07-06 ENCOUNTER — Inpatient Hospital Stay: Payer: 59

## 2021-07-06 ENCOUNTER — Inpatient Hospital Stay: Payer: 59 | Admitting: Pharmacist

## 2021-07-06 VITALS — BP 107/70 | HR 88 | Temp 98.1°F | Resp 18 | Wt 90.1 lb

## 2021-07-06 DIAGNOSIS — Z5112 Encounter for antineoplastic immunotherapy: Secondary | ICD-10-CM | POA: Diagnosis not present

## 2021-07-06 DIAGNOSIS — Z95828 Presence of other vascular implants and grafts: Secondary | ICD-10-CM

## 2021-07-06 DIAGNOSIS — C50212 Malignant neoplasm of upper-inner quadrant of left female breast: Secondary | ICD-10-CM

## 2021-07-06 DIAGNOSIS — K521 Toxic gastroenteritis and colitis: Secondary | ICD-10-CM

## 2021-07-06 DIAGNOSIS — Z171 Estrogen receptor negative status [ER-]: Secondary | ICD-10-CM

## 2021-07-06 DIAGNOSIS — C50912 Malignant neoplasm of unspecified site of left female breast: Secondary | ICD-10-CM

## 2021-07-06 LAB — CBC WITH DIFFERENTIAL (CANCER CENTER ONLY)
Abs Immature Granulocytes: 0.04 K/uL (ref 0.00–0.07)
Basophils Absolute: 0.1 K/uL (ref 0.0–0.1)
Basophils Relative: 1 %
Eosinophils Absolute: 0.9 K/uL — ABNORMAL HIGH (ref 0.0–0.5)
Eosinophils Relative: 7 %
HCT: 26.7 % — ABNORMAL LOW (ref 36.0–46.0)
Hemoglobin: 8.7 g/dL — ABNORMAL LOW (ref 12.0–15.0)
Immature Granulocytes: 0 %
Lymphocytes Relative: 14 %
Lymphs Abs: 1.7 K/uL (ref 0.7–4.0)
MCH: 27 pg (ref 26.0–34.0)
MCHC: 32.6 g/dL (ref 30.0–36.0)
MCV: 82.9 fL (ref 80.0–100.0)
Monocytes Absolute: 1 K/uL (ref 0.1–1.0)
Monocytes Relative: 8 %
Neutro Abs: 8.5 K/uL — ABNORMAL HIGH (ref 1.7–7.7)
Neutrophils Relative %: 70 %
Platelet Count: 354 K/uL (ref 150–400)
RBC: 3.22 MIL/uL — ABNORMAL LOW (ref 3.87–5.11)
RDW: 17.6 % — ABNORMAL HIGH (ref 11.5–15.5)
WBC Count: 12.1 K/uL — ABNORMAL HIGH (ref 4.0–10.5)
nRBC: 0 % (ref 0.0–0.2)

## 2021-07-06 LAB — MAGNESIUM: Magnesium: 1.8 mg/dL (ref 1.7–2.4)

## 2021-07-06 LAB — CMP (CANCER CENTER ONLY)
ALT: 9 U/L (ref 0–44)
AST: 12 U/L — ABNORMAL LOW (ref 15–41)
Albumin: 3.4 g/dL — ABNORMAL LOW (ref 3.5–5.0)
Alkaline Phosphatase: 94 U/L (ref 38–126)
Anion gap: 8 (ref 5–15)
BUN: 13 mg/dL (ref 6–20)
CO2: 27 mmol/L (ref 22–32)
Calcium: 9.2 mg/dL (ref 8.9–10.3)
Chloride: 100 mmol/L (ref 98–111)
Creatinine: 0.89 mg/dL (ref 0.44–1.00)
GFR, Estimated: >60 mL/min
Glucose, Bld: 103 mg/dL — ABNORMAL HIGH (ref 70–99)
Potassium: 3.4 mmol/L — ABNORMAL LOW (ref 3.5–5.1)
Sodium: 135 mmol/L (ref 135–145)
Total Bilirubin: 0.2 mg/dL — ABNORMAL LOW (ref 0.3–1.2)
Total Protein: 7.1 g/dL (ref 6.5–8.1)

## 2021-07-06 MED ORDER — SODIUM CHLORIDE 0.9% FLUSH
10.0000 mL | INTRAVENOUS | Status: AC | PRN
  Administered 2021-07-06: 14:00:00 10 mL

## 2021-07-06 MED ORDER — SODIUM CHLORIDE 0.9 % IV SOLN: Freq: Once | INTRAVENOUS | Status: AC

## 2021-07-06 MED ORDER — ALTEPLASE 2 MG IJ SOLR
2.0000 mg | Freq: Once | INTRAMUSCULAR | Status: AC | PRN
  Administered 2021-07-06: 15:00:00 2 mg

## 2021-07-06 MED ORDER — DIPHENHYDRAMINE HCL 25 MG PO CAPS
25.0000 mg | ORAL_CAPSULE | Freq: Once | ORAL | Status: AC
  Administered 2021-07-06: 15:00:00 25 mg via ORAL
  Filled 2021-07-06: qty 1

## 2021-07-06 MED ORDER — POTASSIUM CHLORIDE CRYS ER 20 MEQ PO TBCR
20.0000 meq | EXTENDED_RELEASE_TABLET | Freq: Once | ORAL | Status: AC
  Administered 2021-07-06: 16:00:00 20 meq via ORAL

## 2021-07-06 MED ORDER — TRASTUZUMAB-ANNS CHEMO 150 MG IV SOLR
252.0000 mg | Freq: Once | INTRAVENOUS | Status: AC
  Administered 2021-07-06: 16:00:00 252 mg via INTRAVENOUS
  Filled 2021-07-06: qty 12

## 2021-07-06 MED ORDER — ALTEPLASE 2 MG IJ SOLR: 2.0000 mg | Freq: Once | INTRAMUSCULAR | Status: AC | PRN

## 2021-07-06 MED ORDER — SODIUM CHLORIDE 0.9% FLUSH
10.0000 mL | INTRAVENOUS | Status: DC | PRN
  Administered 2021-07-06: 16:00:00 10 mL

## 2021-07-06 MED ORDER — ACETAMINOPHEN 325 MG PO TABS
650.0000 mg | ORAL_TABLET | Freq: Once | ORAL | Status: AC
  Administered 2021-07-06: 15:00:00 650 mg via ORAL
  Filled 2021-07-06: qty 2

## 2021-07-06 MED ORDER — HEPARIN SOD (PORK) LOCK FLUSH 100 UNIT/ML IV SOLN
500.0000 [IU] | Freq: Once | INTRAVENOUS | Status: AC | PRN
  Administered 2021-07-06: 16:00:00 500 [IU]

## 2021-07-06 NOTE — Patient Instructions (Signed)
[facility]  Discharge Instructions: Thank you for choosing [facility] to provide your oncology and hematology care.   If you have a lab appointment with the Cancer Center, please go directly to the Cancer Center and check in at the registration area.   Wear comfortable clothing and clothing appropriate for easy access to any Portacath or PICC line.   We strive to give you quality time with your provider. You may need to reschedule your appointment if you arrive late (15 or more minutes).  Arriving late affects you and other patients whose appointments are after yours.  Also, if you miss three or more appointments without notifying the office, you may be dismissed from the clinic at the provider's discretion.      For prescription refill requests, have your pharmacy contact our office and allow 72 hours for refills to be completed.    Today you received the following chemotherapy and/or immunotherapy agents Trastuzumab-anns (Kanjinti)      To help prevent nausea and vomiting after your treatment, we encourage you to take your nausea medication as directed.  BELOW ARE SYMPTOMS THAT SHOULD BE REPORTED IMMEDIATELY: *FEVER GREATER THAN 100.4 F (38 C) OR HIGHER *CHILLS OR SWEATING *NAUSEA AND VOMITING THAT IS NOT CONTROLLED WITH YOUR NAUSEA MEDICATION *UNUSUAL SHORTNESS OF BREATH *UNUSUAL BRUISING OR BLEEDING *URINARY PROBLEMS (pain or burning when urinating, or frequent urination) *BOWEL PROBLEMS (unusual diarrhea, constipation, pain near the anus) TENDERNESS IN MOUTH AND THROAT WITH OR WITHOUT PRESENCE OF ULCERS (sore throat, sores in mouth, or a toothache) UNUSUAL RASH, SWELLING OR PAIN  UNUSUAL VAGINAL DISCHARGE OR ITCHING   Items with * indicate a potential emergency and should be followed up as soon as possible or go to the Emergency Department if any problems should occur.  Please show the CHEMOTHERAPY ALERT CARD or IMMUNOTHERAPY ALERT  CARD at check-in to the Emergency Department and triage nurse.  Should you have questions after your visit or need to cancel or reschedule your appointment, please contact [facility]  Dept: [phone]  and follow the prompts.  Office hours are 8:00 a.m. to 4:30 p.m. Monday - Friday. Please note that voicemails left after 4:00 p.m. may not be returned until the following business day.  We are closed weekends and major holidays. You have access to a nurse at all times for urgent questions. Please call the main number to the clinic Dept: [phone] and follow the prompts.   For any non-urgent questions, you may also contact your provider using MyChart. We now offer e-Visits for anyone 18 and older to request care online for non-urgent symptoms. For details visit [email or web address].   Also download the MyChart app! Go to the app store, search "MyChart", open the app, select , and log in with your MyChart username and password.  Due to Covid, a mask is required upon entering the hospital/clinic. If you do not have a mask, one will be given to you upon arrival. For doctor visits, patients may have 1 support person aged 18 or older with them. For treatment visits, patients cannot have anyone with them due to current Covid guidelines and our immunocompromised population.   

## 2021-07-06 NOTE — Progress Notes (Signed)
stool

## 2021-07-06 NOTE — Progress Notes (Signed)
[place]       Telephone: [phone]?Fax: [phone]   Oncology Clinical Pharmacist Practitioner Progress Note  Melissa [name] was contacted via in-person visit to discuss her chemotherapy regimen for neratinib which they receive under the care of Dr. Nicholas Lose.   Current treatment regimen and start date Neratinib (04/26/21) Trastuzumab (06/15/20)  Interval History She continues to HOLD neratinib 120 mg by mouth  daily on days 1 to 28 of a 28-day cycle. This is being given  in combination with trastuzumab every 21 days . Therapy is planned to continue until disease progression or unacceptable toxicity.   Response to Therapy Melissa [name]'s last visit with clinical pharmacy was on December 19.  At that time, we had discussed her continued diarrhea with 3 tablets daily of neratinib.  She has continued to hold the neratinib since December 16.  During her visit today, she reports that she continues to have 2-4 loose stools per day.  She explains that she has used diphenoxylate/atropine and loperamide but she has found little relief from the loose stools.  She states that she is not having any blood in her stools but has very little appetite.  She has lost 3 pounds since 06/15/21.  We again discussed foods to avoid while having loose stools and food to consider such as bananas, white rice, applesauce, and white toast.  A brochure on diarrhea was provided reviewing some of these concepts. We also discussed a possible nutrition consult which she will think about. She does report that she has not had a bowel movement today since 4am which for Melissa [name] is a relief.  She was able to get a good breakfast in today.  Reviewed findings with Dr. Chryl Heck (Dr. Lindi Adie away this week) and decided out of an abundance of caution to check for a possible infectious source for the continued loose stools as Melissa [name] has been off of neratinib since 06/25/21. At the time of our visit today, she had not  been able to provide a stool sample to the nursing staff. Melissa [name] reports drinking a lot of Gatorade to stay hydrated.  She is having some dizziness but it "comes and goes" and not constant.  This could be multi-factorial as Melissa [name] was recently treated with [facility] for known brain mets.   Melissa [name] labs were reviewed with her today.  Serum creatinine, LFTs, and magnesium are all stable.  Potassium was slightly low at 3.4 mmol/L and so potassium chloride 20 mEQ PO was given during during her trastuzumab infusion.  After speaking to Melissa [name] today, it is unlikely that even the neratinib 1 tablet daily that Dr. Lindi Adie recommended will be suitable to Melissa [name].  Given the fact that today has been her first day without multiple loose stools since coming off of neratinib on 06/25/21 is encouraging, but it is unlikely she would be able to tolerate even a minimal dose of neratinib.  Discussed with Melissa [name] that it is likely, based on the uncontrollable loose stools, that we will need to switch therapy.  Explained that she has scans coming up on 07/22/21 and a Dr. Lindi Adie visit on 07/27/21.  Told Melissa [name] that I will forward our visit today to Dr. Lindi Adie to see if he may want to switch therapy prior to her scans but it may be likely that the scans are reviewed first, and then therapy change initiated.  Instructed Melissa [name] to contact the clinic immediately if her  loose stools continue but to avoid anti-diarrheals until we can rule out an infectious source.    Allergies Allergies  Allergen Reactions   Aspirin Anaphylaxis and Shortness Of Breath    THROAT CLOSES   Protonix [Pantoprazole] Nausea Only and Other (See Comments)    Also caused a "film in the mouth" and caused chest pressure   Iodinated Contrast Media Rash    Patient allergic to contrast used in radiation oncology for CT simulation     Vitals Vitals with BMI 07/06/2021 06/29/2021 06/15/2021  Height - - 5\' 2"   Weight 90 lbs 2 oz - 93 lbs 5 oz   BMI 61.44 - 31.54  Systolic 008 676 195  Diastolic 70 68 50  Pulse 88 84 88     Laboratory Data CBC EXTENDED Latest Ref Rng & Units 07/06/2021 06/15/2021 05/24/2021  WBC 4.0 - 10.5 K/uL 12.1(H) 14.1(H) 12.2(H)  RBC 3.87 - 5.11 MIL/uL 3.22(L) 3.15(L) 3.29(L)  HGB 12.0 - 15.0 g/dL 8.7(L) 8.4(L) 9.0(L)  HCT 36.0 - 46.0 % 26.7(L) 26.6(L) 27.2(L)  PLT 150 - 400 K/uL 354 379 422(H)  NEUTROABS 1.7 - 7.7 K/uL 8.5(H) 10.7(H) 8.0(H)  LYMPHSABS 0.7 - 4.0 K/uL 1.7 1.3 1.6    CMP Latest Ref Rng & Units 07/06/2021 06/15/2021 05/24/2021  Glucose 70 - 99 mg/dL 103(H) 96 75  BUN 6 - 20 mg/dL 13 9 9   Creatinine 0.44 - 1.00 mg/dL 0.89 0.76 0.67  Sodium 135 - 145 mmol/L 135 139 140  Potassium 3.5 - 5.1 mmol/L 3.4(L) 4.0 4.2  Chloride 98 - 111 mmol/L 100 107 108  CO2 22 - 32 mmol/L 27 24 24   Calcium 8.9 - 10.3 mg/dL 9.2 8.7(L) 8.9  Total Protein 6.5 - 8.1 g/dL 7.1 6.9 7.0  Total Bilirubin 0.3 - 1.2 mg/dL 0.2(L) <0.2(L) <0.2(L)  Alkaline Phos 38 - 126 U/L 94 100 108  AST 15 - 41 U/L 12(L) 12(L) 14(L)  ALT 0 - 44 U/L 9 8 8     Lab Results  Component Value Date   MG 1.8 07/06/2021   MG 1.9 05/19/2021     Adverse Effects Assessment Diarrhea: refractory to diphenoxylate/atropine and loperamide despite being off neratinib since 06/25/21.  As above, discussed symptoms with Dr. Chryl Heck and ordered infectious workup. At the time of our visit, Melissa [name] had not been able to provide a stool sample Explained to Melissa [name] to contact clinic if diarrhea worsens but to avoid anti-diarrheals until infectious source such as Cdiff can be ruled out.  Adherence Assessment Melissa [name] reports missing 0 doses over the past 1 weeks due to adherence   Reason for missed dose: holding due to toxicity since 06/25/21. Patient was re-educated on importance of adherence.   Access Assessment Melissa [name] is currently receiving her neratinib through  [employer] concerns:  no  Medication Reconciliation The  patient's medication list was reviewed today with the patient? No New medications or herbal supplements have recently been started? No  Any medications have been discontinued? No  The medication list was updated and reconciled based on the patient's most recent medication list in the electronic medical record (EMR) including herbal products and OTC medications.   Medications Current Outpatient Medications  Medication Sig Dispense Refill   albuterol (VENTOLIN HFA) 108 (90 Base) MCG/ACT inhaler Inhale 2 puffs into the lungs every 6 (six) hours as needed. 8 g 5   budesonide-formoterol (SYMBICORT) 160-4.5 MCG/ACT inhaler Inhale 2 puffs into the lungs 2 (two)  times daily. 3 each 3   chlorpheniramine-HYDROcodone (TUSSIONEX) 10-8 MG/5ML SUER Take 5 mLs by mouth at bedtime. 115 mL 0   diphenoxylate-atropine (LOMOTIL) 2.5-0.025 MG tablet Take 1 tablet by mouth 4 (four) times daily as needed for diarrhea or loose stools. 60 tablet 3   ELDERBERRY PO Take by mouth daily. Per patient, takes one daily (Patient not taking: No sig reported)     levalbuterol (XOPENEX) 0.63 MG/3ML nebulizer solution Take 3 mLs (0.63 mg total) by nebulization every 4 (four) hours as needed for wheezing or shortness of breath. (Patient not taking: No sig reported) 540 mL 6   lidocaine-prilocaine (EMLA) cream APPLY EXTERNALLY TO THE AFFECTED AREA 1 TIME 30 g 3   megestrol (MEGACE) 20 MG tablet Take 1 tablet (20 mg total) by mouth daily. (Patient not taking: No sig reported) 30 tablet 3   naproxen sodium (ALEVE) 220 MG tablet Take 440 mg by mouth 2 (two) times daily as needed (headache).      Neratinib Maleate (NERLYNX) 40 MG tablet Take 3 tablets (120 mg total) by mouth daily. Take with food. 180 tablet 3   predniSONE (DELTASONE) 50 MG tablet 1 tab 12 hours before scan, 1 tab 7 hours before and 1 tab 1 hour before 3 tablet 1   voriconazole (VFEND) 200 MG tablet Take 1 tablet (200 mg total) by mouth 2 (two) times daily. 60 tablet 11    No current facility-administered medications for this visit.   Facility-Administered Medications Ordered in Other Visits  Medication Dose Route Frequency Provider Last Rate Last Admin   alteplase (CATHFLO ACTIVASE) injection 2 mg  2 mg Intracatheter Once PRN Nicholas Lose, MD       sodium chloride flush (NS) 0.9 % injection 10 mL  10 mL Intracatheter PRN Nicholas Lose, MD   10 mL at 07/06/21 1428   sodium chloride flush (NS) 0.9 % injection 10 mL  10 mL Intracatheter PRN Nicholas Lose, MD   10 mL at 07/06/21 1625    Drug-Drug Interactions (DDIs) DDIs were evaluated? Yes Significant DDIs? No , not current taking neratinib and continues voriconazole The patient was instructed to speak with their health care provider and/or the oral chemotherapy pharmacist before starting any new drug, including prescription or over the counter, natural / herbal products, or vitamins.  Supportive Care Continuing to hold neratinib Reviewed options for continued diarrhea  Dosing Assessment Hepatic adjustments needed? No  Renal adjustments needed? No  Toxicity adjustments needed? Yes  The current dosing regimen is not appropriate to continue at this time.  Follow-Up Plan Trastuzumab today and again in 3 weeks if Dr. Lindi Adie continues Continue to HOLD neratinib and contact clinic if diarrhea worsens. Encouraged that it has subsided today some Rule out infectious source of loose stool if Melissa [name] able to provide stool sample. Was not done before we ended our visit Restaging scans on 07/22/21 and visit with Dr. Lindi Adie on 07/27/21 Dr. Lindi Adie may wait for scans to result before switching therapy. Neratinib unlikely to be able to be continued even at a minimal dose due to refractory diarrhea  Craig Guess Heinrich participated in the discussion, expressed understanding, and voiced agreement with the above plan. All questions were answered to her satisfaction. The patient was advised to contact the clinic at [phone] with any questions or concerns prior to her return visit.   I spent 30 minutes assessing and educating the patient.  Raina Mina, RPH-CPP, 07/06/2021  8:08 PM

## 2021-07-06 NOTE — Progress Notes (Signed)
Ok to proceed with Kanjinti with updated weight today.  Raul Del Tull, [place], BCPS, BCOP 07/06/2021 2:45 PM

## 2021-07-06 NOTE — Progress Notes (Signed)
Late Entry:  Removed 38ml of blood containing Cathflo from pt's RT PAC.  Flushed with 7ml NS w/out resistance and brisk blood return visualized prior to and post flushes.  Proceeded with Kanjinti infusion.

## 2021-07-07 ENCOUNTER — Other Ambulatory Visit: Payer: Self-pay | Admitting: *Deleted

## 2021-07-07 ENCOUNTER — Telehealth: Payer: Self-pay | Admitting: *Deleted

## 2021-07-07 ENCOUNTER — Telehealth: Payer: Self-pay | Admitting: Pharmacist

## 2021-07-07 DIAGNOSIS — R197 Diarrhea, unspecified: Secondary | ICD-10-CM

## 2021-07-07 DIAGNOSIS — Z170 Estrogen receptor positive status [ER+]: Secondary | ICD-10-CM

## 2021-07-07 DIAGNOSIS — C50912 Malignant neoplasm of unspecified site of left female breast: Secondary | ICD-10-CM

## 2021-07-07 NOTE — Telephone Encounter (Signed)
Per discussion with Jenny Reichmann Phd/pharmacy and need for stool collection not obtained yesterday - inquiry made if pt could do at [facility] due to closer to her home.  This RN called [facility] - obtained direct lab number of [phone].  Per lab at [facility] - Pt may either provide at their lab or pick up supplies to do at home.  Pt needs to come thru the main entrance to register between 8-4 and return sample between the same hours.  Labs can be pulled from Epic.  This RN informed pt of above.

## 2021-07-07 NOTE — Telephone Encounter (Signed)
[place]       Telephone: [phone]?Fax: [phone]   Oncology Clinical Pharmacist Practitioner Progress Note  Melissa [name] is a 55 y.o. female with a diagnosis of metastatic breast cancer currently on neratinib under the care of Dr. Nicholas Lose. Dr. Chryl Heck assisting this week in Dr. Geralyn Flash absence. Ms. Donathan was contacted today via telephone.  Ms. Mccleary was seen by clinical pharmacy yesterday and was continuing to have 2-4 loose stools per day despite being off neratinib for over a week.  She was given her trastuzumab infusion yesterday and some oral potassium and the plan was to provide a stool sample in clinic to rule out an infectious source for her continued diarrhea.  She was not able to provide a sample yesterday in clinic.  Nursing will reach out to her today to work out logistics for Ms. Gilman to pick up a stool sample kit from [facility] which is closer to her house per patient's request.  Orders for stool cultures from Dr. Chryl Heck are still active. Will monitor for results of this sample and treat accordingly.  Forwarding note to Dr. Chryl Heck and Dr. Lindi Adie so they are aware.  Discussed the plan with Ms. Ahmad and she is in agreement.  All questions were answered to her satisfaction and she knows to contact the clinic immediately for any new or worsening symptoms.  Clinical pharmacy will continue to support Craig Guess Nippert and Dr. Nicholas Lose as needed.  Raina Mina, RPH-CPP,  07/07/2021  1:08 PM

## 2021-07-08 ENCOUNTER — Other Ambulatory Visit: Payer: Self-pay

## 2021-07-08 ENCOUNTER — Other Ambulatory Visit (HOSPITAL_COMMUNITY)
Admission: RE | Admit: 2021-07-08 | Discharge: 2021-07-08 | Disposition: A | Discharge status: Home / Self Care | Payer: 59 | Source: Ambulatory Visit | Attending: Hematology and Oncology | Admitting: Hematology and Oncology

## 2021-07-08 DIAGNOSIS — Z170 Estrogen receptor positive status [ER+]: Secondary | ICD-10-CM | POA: Insufficient documentation

## 2021-07-08 DIAGNOSIS — R197 Diarrhea, unspecified: Secondary | ICD-10-CM | POA: Insufficient documentation

## 2021-07-08 DIAGNOSIS — C7931 Secondary malignant neoplasm of brain: Secondary | ICD-10-CM | POA: Diagnosis present

## 2021-07-08 DIAGNOSIS — C50212 Malignant neoplasm of upper-inner quadrant of left female breast: Secondary | ICD-10-CM | POA: Insufficient documentation

## 2021-07-08 DIAGNOSIS — C50912 Malignant neoplasm of unspecified site of left female breast: Secondary | ICD-10-CM | POA: Insufficient documentation

## 2021-07-08 LAB — C DIFFICILE QUICK SCREEN W PCR REFLEX
C Diff antigen: NEGATIVE
C Diff interpretation: NOT DETECTED
C Diff toxin: NEGATIVE

## 2021-07-12 LAB — STOOL CULTURE REFLEX - CMPCXR

## 2021-07-12 LAB — STOOL CULTURE: E coli, Shiga toxin Assay: NEGATIVE

## 2021-07-12 LAB — STOOL CULTURE REFLEX - RSASHR

## 2021-07-13 ENCOUNTER — Telehealth: Payer: Self-pay | Admitting: Pharmacist

## 2021-07-13 NOTE — Telephone Encounter (Signed)
[place]       Telephone: [phone]?Fax: [phone]   Oncology Clinical Pharmacist Practitioner Progress Note  Melissa [name] is a 56 y.o. female with a diagnosis of metastatic breast cancer currently holding neratinib under the care of Dr. Nicholas Lose. They were contacted today via telephone.  Melissa [name] was contacted today to follow up on our visit from last week.  She had stool cultures last Friday which all appear to be negative.  She continues to have loose stool 2-4 times a day with some days being less.  The plan was discussed with Dr. Lindi Adie this morning and communicated to Melissa [name].  She will continue to hold neratinib and have her imaging done on 07/22/21 with a follow up appointment with Dr. Lindi Adie and labs on 07/27/21. At that point, imaging will be reviewed and therapy likely switched.  Melissa [name] verbalized understanding and will contact the clinic immediately for new or worsening symptoms.  Clinical pharmacy will continue to support Melissa [name] and Dr. Nicholas Lose as needed.  Raina Mina, RPH-CPP,  07/13/2021  1:23 PM

## 2021-07-21 ENCOUNTER — Telehealth: Payer: Self-pay | Admitting: *Deleted

## 2021-07-21 NOTE — Telephone Encounter (Signed)
RN placed call to pt to review 13 hour prep prior to contrast media tomorrow.  Pt states she has Prednisone and benadryl at home.  Pt verbalized understanding of prep instructions.

## 2021-07-22 ENCOUNTER — Other Ambulatory Visit: Payer: Self-pay

## 2021-07-22 ENCOUNTER — Ambulatory Visit (HOSPITAL_COMMUNITY)
Admission: RE | Admit: 2021-07-22 | Discharge: 2021-07-22 | Disposition: A | Discharge status: Home / Self Care | Payer: 59 | Source: Ambulatory Visit | Attending: Hematology and Oncology | Admitting: Hematology and Oncology

## 2021-07-22 DIAGNOSIS — C7931 Secondary malignant neoplasm of brain: Secondary | ICD-10-CM | POA: Diagnosis present

## 2021-07-22 DIAGNOSIS — C50212 Malignant neoplasm of upper-inner quadrant of left female breast: Secondary | ICD-10-CM | POA: Diagnosis present

## 2021-07-22 DIAGNOSIS — Z170 Estrogen receptor positive status [ER+]: Secondary | ICD-10-CM | POA: Diagnosis present

## 2021-07-22 DIAGNOSIS — C50912 Malignant neoplasm of unspecified site of left female breast: Secondary | ICD-10-CM | POA: Diagnosis present

## 2021-07-22 DIAGNOSIS — C787 Secondary malignant neoplasm of liver and intrahepatic bile duct: Secondary | ICD-10-CM | POA: Diagnosis present

## 2021-07-22 DIAGNOSIS — C50919 Malignant neoplasm of unspecified site of unspecified female breast: Secondary | ICD-10-CM | POA: Diagnosis present

## 2021-07-22 MED ORDER — HEPARIN SOD (PORK) LOCK FLUSH 100 UNIT/ML IV SOLN
500.0000 [IU] | Freq: Once | INTRAVENOUS | Status: AC
  Administered 2021-07-22: 500 [IU] via INTRAVENOUS

## 2021-07-22 MED ORDER — SODIUM CHLORIDE (PF) 0.9 % IJ SOLN
INTRAMUSCULAR | Status: AC
  Filled 2021-07-22: qty 50

## 2021-07-22 MED ORDER — IOHEXOL 300 MG/ML  SOLN
100.0000 mL | Freq: Once | INTRAMUSCULAR | Status: AC | PRN
  Administered 2021-07-22: 75 mL via INTRAVENOUS

## 2021-07-22 MED ORDER — HEPARIN SOD (PORK) LOCK FLUSH 100 UNIT/ML IV SOLN
INTRAVENOUS | Status: AC
  Filled 2021-07-22: qty 5

## 2021-07-22 NOTE — Op Note (Addendum)
Name: Melissa [name]    MRN: [id number]   Date: 06/29/2021    DOB: 1966-04-06   STEREOTACTIC RADIOSURGERY OPERATIVE NOTE  PRE-OPERATIVE DIAGNOSIS: Metastatic breast cancer to the brain, treatment of single lesion, left occipital  POST-OPERATIVE DIAGNOSIS:  Same  PROCEDURE:  Stereotactic Radiosurgery  SURGEON:  Elwin Sleight, DO  RADIATION ONCOLOGIST: Dr. Tammi Klippel, MD  TECHNIQUE:  The patient underwent a radiation treatment planning session in the radiation oncology simulation suite under the care of the radiation oncology physician and physicist.  I participated closely in the radiation treatment planning afterwards. The patient underwent planning CT which was fused to 3T high resolution MRI with 1 mm axial slices.  These images were fused on the planning system.  We contoured the gross target volumes and subsequently expanded this to yield the Planning Target Volume. I actively participated in the planning process.  I helped to define and review the target contours and also the contours of the optic pathway, eyes, brainstem and selected nearby organs at risk.  All the dose constraints for critical structures were reviewed and compared to AAPM Task Group 101.  The prescription dose conformity was reviewed.  I approved the plan electronically.    Accordingly, Melissa [name]  was brought to the TrueBeam stereotactic radiation treatment linac and placed in the custom immobilization mask.  The patient was aligned according to the IR fiducial markers with BrainLab Exactrac, then orthogonal x-rays were used in ExacTrac with the 6DOF robotic table and the shifts were made to align the patient.  Melissa [name] received stereotactic radiosurgery to a prescription dose of 20 Gy to the left occipital lesion uneventfully.    The detailed description of the procedure is recorded in the radiation oncology procedure note.  I was present for the duration of the procedure.  DISPOSITION:   Following delivery, the  patient was transported to nursing in stable condition and monitored for possible acute effects to be discharged to home in stable condition with follow-up in one month.  Elwin Sleight, [facility]

## 2021-07-25 ENCOUNTER — Other Ambulatory Visit: Payer: Self-pay

## 2021-07-25 ENCOUNTER — Emergency Department (HOSPITAL_COMMUNITY): Payer: 59

## 2021-07-25 ENCOUNTER — Observation Stay (HOSPITAL_COMMUNITY)
Admission: EM | Admit: 2021-07-25 | Discharge: 2021-07-26 | Disposition: A | Discharge status: Home / Self Care | Payer: 59 | Attending: Family Medicine | Admitting: Family Medicine

## 2021-07-25 ENCOUNTER — Encounter (HOSPITAL_COMMUNITY): Payer: Self-pay | Admitting: Emergency Medicine

## 2021-07-25 ENCOUNTER — Inpatient Hospital Stay (HOSPITAL_COMMUNITY): Payer: 59

## 2021-07-25 DIAGNOSIS — B449 Aspergillosis, unspecified: Secondary | ICD-10-CM | POA: Diagnosis not present

## 2021-07-25 DIAGNOSIS — J45909 Unspecified asthma, uncomplicated: Secondary | ICD-10-CM | POA: Diagnosis not present

## 2021-07-25 DIAGNOSIS — J449 Chronic obstructive pulmonary disease, unspecified: Secondary | ICD-10-CM | POA: Diagnosis not present

## 2021-07-25 DIAGNOSIS — F17210 Nicotine dependence, cigarettes, uncomplicated: Secondary | ICD-10-CM | POA: Insufficient documentation

## 2021-07-25 DIAGNOSIS — J189 Pneumonia, unspecified organism: Principal | ICD-10-CM | POA: Diagnosis present

## 2021-07-25 DIAGNOSIS — Z20822 Contact with and (suspected) exposure to covid-19: Secondary | ICD-10-CM | POA: Diagnosis not present

## 2021-07-25 DIAGNOSIS — R079 Chest pain, unspecified: Secondary | ICD-10-CM

## 2021-07-25 DIAGNOSIS — C50212 Malignant neoplasm of upper-inner quadrant of left female breast: Secondary | ICD-10-CM | POA: Diagnosis not present

## 2021-07-25 DIAGNOSIS — R059 Cough, unspecified: Secondary | ICD-10-CM

## 2021-07-25 DIAGNOSIS — C7931 Secondary malignant neoplasm of brain: Secondary | ICD-10-CM | POA: Diagnosis present

## 2021-07-25 DIAGNOSIS — Z7951 Long term (current) use of inhaled steroids: Secondary | ICD-10-CM | POA: Diagnosis not present

## 2021-07-25 DIAGNOSIS — J069 Acute upper respiratory infection, unspecified: Secondary | ICD-10-CM

## 2021-07-25 DIAGNOSIS — Z171 Estrogen receptor negative status [ER-]: Secondary | ICD-10-CM

## 2021-07-25 DIAGNOSIS — J984 Other disorders of lung: Secondary | ICD-10-CM

## 2021-07-25 DIAGNOSIS — Z170 Estrogen receptor positive status [ER+]: Secondary | ICD-10-CM

## 2021-07-25 DIAGNOSIS — C50912 Malignant neoplasm of unspecified site of left female breast: Secondary | ICD-10-CM | POA: Diagnosis present

## 2021-07-25 LAB — CBC
HCT: 31.9 % — ABNORMAL LOW (ref 36.0–46.0)
Hemoglobin: 10.3 g/dL — ABNORMAL LOW (ref 12.0–15.0)
MCH: 28.1 pg (ref 26.0–34.0)
MCHC: 32.3 g/dL (ref 30.0–36.0)
MCV: 86.9 fL (ref 80.0–100.0)
Platelets: 470 10*3/uL — ABNORMAL HIGH (ref 150–400)
RBC: 3.67 MIL/uL — ABNORMAL LOW (ref 3.87–5.11)
RDW: 18.6 % — ABNORMAL HIGH (ref 11.5–15.5)
WBC: 15.5 10*3/uL — ABNORMAL HIGH (ref 4.0–10.5)
nRBC: 0 % (ref 0.0–0.2)

## 2021-07-25 LAB — BASIC METABOLIC PANEL
Anion gap: 5 (ref 5–15)
BUN: 12 mg/dL (ref 6–20)
CO2: 28 mmol/L (ref 22–32)
Calcium: 9.1 mg/dL (ref 8.9–10.3)
Chloride: 103 mmol/L (ref 98–111)
Creatinine, Ser: 0.8 mg/dL (ref 0.44–1.00)
GFR, Estimated: >60 mL/min (ref >60)
Glucose, Bld: 88 mg/dL (ref 70–99)
Potassium: 4.1 mmol/L (ref 3.5–5.1)
Sodium: 136 mmol/L (ref 135–145)

## 2021-07-25 LAB — RESP PANEL BY RT-PCR (FLU A&B, COVID) ARPGX2
Influenza A by PCR: NEGATIVE
Influenza B by PCR: NEGATIVE
SARS Coronavirus 2 by RT PCR: NEGATIVE

## 2021-07-25 LAB — TROPONIN I (HIGH SENSITIVITY)
Troponin I (High Sensitivity): <2 ng/L (ref <18)
Troponin I (High Sensitivity): <2 ng/L (ref <18)

## 2021-07-25 MED ORDER — SODIUM CHLORIDE 0.9 % IV SOLN: INTRAVENOUS | Status: DC

## 2021-07-25 MED ORDER — ACETAMINOPHEN 325 MG PO TABS: 650.0000 mg | ORAL_TABLET | Freq: Four times a day (QID) | ORAL | Status: DC | PRN

## 2021-07-25 MED ORDER — SODIUM CHLORIDE 0.9% FLUSH: 3.0000 mL | Freq: Two times a day (BID) | INTRAVENOUS | Status: DC

## 2021-07-25 MED ORDER — HEPARIN SODIUM (PORCINE) 5000 UNIT/ML IJ SOLN
5000.0000 [IU] | Freq: Three times a day (TID) | INTRAMUSCULAR | Status: DC
  Administered 2021-07-25 – 2021-07-26 (×2): 5000 [IU] via SUBCUTANEOUS
  Filled 2021-07-25 (×2): qty 1

## 2021-07-25 MED ORDER — MORPHINE SULFATE (PF) 4 MG/ML IV SOLN
3.0000 mg | Freq: Once | INTRAVENOUS | Status: AC
  Administered 2021-07-25: 3 mg via INTRAVENOUS
  Filled 2021-07-25: qty 1

## 2021-07-25 MED ORDER — METHYLPREDNISOLONE SODIUM SUCC 40 MG IJ SOLR
INTRAMUSCULAR | Status: AC
  Administered 2021-07-25: 40 mg
  Filled 2021-07-25: qty 1

## 2021-07-25 MED ORDER — FAMOTIDINE IN NACL 20-0.9 MG/50ML-% IV SOLN
20.0000 mg | Freq: Two times a day (BID) | INTRAVENOUS | Status: DC
  Administered 2021-07-25 – 2021-07-26 (×2): 20 mg via INTRAVENOUS
  Filled 2021-07-25 (×2): qty 50

## 2021-07-25 MED ORDER — HYDROCOD POLST-CPM POLST ER 10-8 MG/5ML PO SUER
5.0000 mL | Freq: Every day | ORAL | Status: DC
Start: 2021-07-25 — End: 2021-07-26
  Administered 2021-07-25: 5 mL via ORAL
  Filled 2021-07-25: qty 5

## 2021-07-25 MED ORDER — POLYETHYLENE GLYCOL 3350 17 G PO PACK: 17.0000 g | PACK | Freq: Every day | ORAL | Status: DC | PRN

## 2021-07-25 MED ORDER — ONDANSETRON HCL 4 MG/2ML IJ SOLN: 4.0000 mg | Freq: Four times a day (QID) | INTRAMUSCULAR | Status: DC | PRN

## 2021-07-25 MED ORDER — METHYLPREDNISOLONE SODIUM SUCC 40 MG IJ SOLR: 40.0000 mg | Freq: Once | INTRAMUSCULAR | Status: AC

## 2021-07-25 MED ORDER — IPRATROPIUM-ALBUTEROL 0.5-2.5 (3) MG/3ML IN SOLN
3.0000 mL | Freq: Four times a day (QID) | RESPIRATORY_TRACT | Status: DC
  Administered 2021-07-25: 3 mL via RESPIRATORY_TRACT
  Filled 2021-07-25: qty 3

## 2021-07-25 MED ORDER — IPRATROPIUM-ALBUTEROL 0.5-2.5 (3) MG/3ML IN SOLN: 3.0000 mL | Freq: Every day | RESPIRATORY_TRACT | Status: DC

## 2021-07-25 MED ORDER — DIPHENHYDRAMINE HCL 25 MG PO CAPS
50.0000 mg | ORAL_CAPSULE | Freq: Once | ORAL | Status: AC
  Administered 2021-07-25: 50 mg via ORAL
  Filled 2021-07-25: qty 2

## 2021-07-25 MED ORDER — ONDANSETRON HCL 4 MG/2ML IJ SOLN
4.0000 mg | Freq: Once | INTRAMUSCULAR | Status: AC
  Administered 2021-07-25: 4 mg via INTRAVENOUS
  Filled 2021-07-25: qty 2

## 2021-07-25 MED ORDER — ONDANSETRON HCL 4 MG PO TABS: 4.0000 mg | ORAL_TABLET | Freq: Four times a day (QID) | ORAL | Status: DC | PRN

## 2021-07-25 MED ORDER — PREDNISONE 50 MG PO TABS
50.0000 mg | ORAL_TABLET | Freq: Four times a day (QID) | ORAL | Status: DC
  Administered 2021-07-25: 50 mg via ORAL
  Filled 2021-07-25: qty 1

## 2021-07-25 MED ORDER — SODIUM CHLORIDE 0.9% FLUSH: 3.0000 mL | INTRAVENOUS | Status: DC | PRN

## 2021-07-25 MED ORDER — DIPHENHYDRAMINE HCL 50 MG/ML IJ SOLN
25.0000 mg | Freq: Once | INTRAMUSCULAR | Status: AC
  Administered 2021-07-25: 25 mg via INTRAVENOUS

## 2021-07-25 MED ORDER — VORICONAZOLE 200 MG PO TABS
200.0000 mg | ORAL_TABLET | Freq: Two times a day (BID) | ORAL | Status: DC
  Administered 2021-07-26: 200 mg via ORAL
  Filled 2021-07-25 (×6): qty 1

## 2021-07-25 MED ORDER — DIPHENHYDRAMINE HCL 25 MG PO CAPS: 50.0000 mg | ORAL_CAPSULE | Freq: Once | ORAL | Status: DC

## 2021-07-25 MED ORDER — TRAZODONE HCL 50 MG PO TABS: 50.0000 mg | ORAL_TABLET | Freq: Every evening | ORAL | Status: DC | PRN

## 2021-07-25 MED ORDER — NERATINIB MALEATE 40 MG PO TABS: 120.0000 mg | ORAL_TABLET | Freq: Every day | ORAL | Status: DC

## 2021-07-25 MED ORDER — DIPHENHYDRAMINE HCL 50 MG/ML IJ SOLN: 50.0000 mg | Freq: Once | INTRAMUSCULAR | Status: DC

## 2021-07-25 MED ORDER — PREDNISONE 20 MG PO TABS
50.0000 mg | ORAL_TABLET | Freq: Every day | ORAL | Status: DC
  Administered 2021-07-26: 50 mg via ORAL
  Filled 2021-07-25: qty 3

## 2021-07-25 MED ORDER — SODIUM CHLORIDE 0.9 % IV SOLN: 1.0000 g | INTRAVENOUS | Status: DC

## 2021-07-25 MED ORDER — SODIUM CHLORIDE 0.9 % IV SOLN: 500.0000 mg | INTRAVENOUS | Status: DC

## 2021-07-25 MED ORDER — DIPHENOXYLATE-ATROPINE 2.5-0.025 MG PO TABS: 1.0000 | ORAL_TABLET | Freq: Four times a day (QID) | ORAL | Status: DC | PRN

## 2021-07-25 MED ORDER — DIPHENHYDRAMINE HCL 50 MG/ML IJ SOLN: 50.0000 mg | Freq: Once | INTRAMUSCULAR | Status: AC

## 2021-07-25 MED ORDER — DIPHENHYDRAMINE HCL 50 MG/ML IJ SOLN
25.0000 mg | Freq: Every day | INTRAMUSCULAR | Status: DC
  Administered 2021-07-25: 25 mg via INTRAVENOUS
  Filled 2021-07-25 (×2): qty 1

## 2021-07-25 MED ORDER — SODIUM CHLORIDE 0.9 % IV SOLN: 250.0000 mL | INTRAVENOUS | Status: DC | PRN

## 2021-07-25 MED ORDER — ACETAMINOPHEN 650 MG RE SUPP: 650.0000 mg | Freq: Four times a day (QID) | RECTAL | Status: DC | PRN

## 2021-07-25 MED ORDER — IOHEXOL 350 MG/ML SOLN
75.0000 mL | Freq: Once | INTRAVENOUS | Status: AC | PRN
  Administered 2021-07-25: 75 mL via INTRAVENOUS

## 2021-07-25 MED ORDER — MOMETASONE FURO-FORMOTEROL FUM 200-5 MCG/ACT IN AERO
2.0000 | INHALATION_SPRAY | Freq: Two times a day (BID) | RESPIRATORY_TRACT | Status: DC
  Administered 2021-07-25 – 2021-07-26 (×2): 2 via RESPIRATORY_TRACT
  Filled 2021-07-25: qty 8.8

## 2021-07-25 MED ORDER — SODIUM CHLORIDE 0.9 % IV SOLN
1.0000 g | Freq: Once | INTRAVENOUS | Status: AC
  Administered 2021-07-25: 1 g via INTRAVENOUS
  Filled 2021-07-25: qty 10

## 2021-07-25 MED ORDER — ALBUTEROL SULFATE (2.5 MG/3ML) 0.083% IN NEBU: 2.5000 mg | INHALATION_SOLUTION | RESPIRATORY_TRACT | Status: DC | PRN

## 2021-07-25 MED ORDER — SODIUM CHLORIDE 0.9 % IV SOLN
2.0000 g | Freq: Once | INTRAVENOUS | Status: DC
  Filled 2021-07-25: qty 20

## 2021-07-25 MED ORDER — SODIUM CHLORIDE 0.9 % IV SOLN
500.0000 mg | INTRAVENOUS | Status: DC
  Administered 2021-07-25: 500 mg via INTRAVENOUS
  Filled 2021-07-25: qty 5

## 2021-07-25 MED ORDER — BISACODYL 10 MG RE SUPP: 10.0000 mg | Freq: Every day | RECTAL | Status: DC | PRN

## 2021-07-25 MED ORDER — HYDROCORTISONE SOD SUC (PF) 100 MG IJ SOLR
200.0000 mg | Freq: Once | INTRAMUSCULAR | Status: AC
  Administered 2021-07-25: 200 mg via INTRAVENOUS
  Filled 2021-07-25: qty 4

## 2021-07-25 NOTE — ED Triage Notes (Signed)
Pt to the ED with complaints of left side chest pain that radiates to the neck and back.  Pt states it is a sharp pain with every breath she takes.

## 2021-07-25 NOTE — ED Provider Notes (Signed)
[facility] Provider Note  CSN: [id number] Arrival date & time: 07/25/21 1001  Chief Complaint(s) Chest Pain  HPI Melissa [name] is a 56 y.o. female with PMH metastatic breast cancer on active chemotherapy, known aspergillosis and coccidiomycosis who presents the emergency department for evaluation of chest pain and shortness of breath.  Patient recently had a CAT scan done 4 days ago that showed a developing infection around the left lower lobe areas of aspergillosis.  She states that for the last 72 hours she has had worsening pleuritic chest pain and cough.  Denies abdominal pain nausea, vomiting, headache, fever or other systemic symptoms.   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[place]) 02/26/2020   Asthma    Breast cancer ([place])    Bronchiectasis ([place])    Bronchiolitis    Cancer ([place])    breast cancer 2014   Cancer, metastatic to liver ([place])    6213   Complication of anesthesia    bp dropped + desat    COPD (chronic obstructive pulmonary disease) ([place])    Dyspnea    DOE   GERD (gastroesophageal reflux disease)    H/O coccidioidomycosis    was reason for lung lobectomy   Headache(784.0)    due to eye strain or not eating   History of anemia    no current problem   History of asthma    as a child   History of breast cancer 2014   left   History of chemotherapy    finished 07/17/2013   History of hiatal hernia    AGE 41   Hx of radiation therapy 03/25/13-05/06/13   left breast 5000 cGy/25 sessions, left breast boost 1000 cGy/5 sessions   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   Pneumonia    LAST FLARE UP 01/2018   Rash 02/22/2021   Runny nose 07/30/2013   clear drainage   Smoker 05/24/2021   Wears dentures    upper   Patient Active Problem List   Diagnosis Date Noted   Pneumonia 07/25/2021   Smoker 05/24/2021   Drug rash 02/22/2021    Gastroesophageal reflux disease 03/05/2020   Healthcare maintenance 03/05/2020   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   Port-A-Cath in place 08/05/2019   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([place]) 06/10/2019   Mediastinal adenopathy    H/O coccidioidomycosis 05/16/2018   DOE (dyspnea on exertion) 05/03/2018   Diarrhea 04/22/2018   Cavitary lesion of lung in area of previous cyts in apex of Sup Segment of LLL 03/29/2018   Aortic atherosclerosis ([place]) 01/22/2018   Emphysema lung ([place]) 01/22/2018   CAP (community acquired pneumonia) 11/23/2017   Metastasis to brain ([place]) 08/04/2017   Brain metastasis ([place]) 07/27/2017   Nicotine abuse 07/27/2017   Migraines 07/27/2017   HCAP (healthcare-associated pneumonia) 11/26/2016   Upper airway cough syndrome 08/10/2016   Abnormal echocardiogram 08/07/2013   Chest tightness or pressure 08/07/2013   Hx of radiation therapy    Edema of left lower extremity 11/19/2012   Tachycardia 09/20/2012   Breast cancer of upper-inner quadrant of left female breast ([place]) 06/08/2012   Obstructive bronchiectasis ([place]) 01/26/2011   Cigarette smoker 01/26/2011   Home Medication(s) Prior to Admission medications   Medication Sig Start Date End Date Taking? Authorizing Provider  albuterol (VENTOLIN HFA) 108 (90 Base) MCG/ACT inhaler  Inhale 2 puffs into the lungs every 6 (six) hours as needed. 11/03/20  Yes Icard, Octavio Graves, DO  budesonide-formoterol (SYMBICORT) 160-4.5 MCG/ACT inhaler Inhale 2 puffs into the lungs 2 (two) times daily. 11/23/20  Yes Icard, Octavio Graves, DO  chlorpheniramine-HYDROcodone (TUSSIONEX) 10-8 MG/5ML SUER Take 5 mLs by mouth at bedtime. 06/25/21  Yes Nicholas Lose, MD  diphenoxylate-atropine (LOMOTIL) 2.5-0.025 MG tablet Take 1 tablet by mouth 4 (four) times daily as needed for diarrhea or loose stools. 06/11/21  Yes Nicholas Lose, MD  lidocaine-prilocaine (EMLA) cream APPLY EXTERNALLY TO THE AFFECTED AREA 1 TIME 08/19/20  Yes  Nicholas Lose, MD  Multiple Vitamins-Minerals (EMERGEN-C VITAMIN C PO) Take by mouth.   Yes [provider]  naproxen sodium (ALEVE) 220 MG tablet Take 440 mg by mouth 2 (two) times daily as needed (headache).    Yes [provider]  Neratinib Maleate (NERLYNX) 40 MG tablet Take 3 tablets (120 mg total) by mouth daily. Take with food. 05/24/21  Yes Nicholas Lose, MD  predniSONE (DELTASONE) 50 MG tablet 1 tab 12 hours before scan, 1 tab 7 hours before and 1 tab 1 hour before 05/24/21  Yes Nicholas Lose, MD  Vitamin E 200 units TABS Take by mouth.   Yes [provider]  levalbuterol (XOPENEX) 0.63 MG/3ML nebulizer solution Take 3 mLs (0.63 mg total) by nebulization every 4 (four) hours as needed for wheezing or shortness of breath. Patient not taking: Reported on 05/03/2021 04/20/18   Martyn Ehrich, NP  megestrol (MEGACE) 20 MG tablet Take 1 tablet (20 mg total) by mouth daily. Patient not taking: Reported on 05/03/2021 03/01/21   Nicholas Lose, MD  voriconazole (VFEND) 200 MG tablet Take 1 tablet (200 mg total) by mouth 2 (two) times daily. Patient not taking: Reported on 07/25/2021 05/24/21   Tommy Medal, Lavell Islam, MD                                                                                                                                    Past Surgical History Past Surgical History:  Procedure Laterality Date   APPLICATION OF CRANIAL NAVIGATION N/A 08/04/2017   Procedure: APPLICATION OF CRANIAL NAVIGATION;  Surgeon: [name], [name], MD;  Location: [facility];  Service: Neurosurgery;  Laterality: N/A;   AXILLARY LYMPH NODE DISSECTION Left 02/08/2013   Procedure: LEFT AXILLARY DISSECTION;  Surgeon: Edward Jolly, MD;  Location: [place];  Service: General;  Laterality: Left;   BREAST CYST EXCISION Right 2006   BREAST LUMPECTOMY Left 2014   BREAST LUMPECTOMY WITH NEEDLE LOCALIZATION AND AXILLARY SENTINEL LYMPH NODE BX Left 12/31/2012    Procedure: NEEDLE LOCALIZATION LEFT BREAST LUMPECTOMY AND LEFT AXILLARY SENTENIAL LYMPH NODE BX;  Surgeon: Edward Jolly, MD;  Location: [facility] [place];  Service: General;  Laterality: Left;   BRONCHIAL BRUSHINGS  03/17/2020   Procedure: BRONCHIAL BRUSHINGS;  Surgeon: Garner Nash,  DO;  Location: [facility];  Service: Pulmonary;;   BRONCHIAL WASHINGS  03/17/2020   Procedure: BRONCHIAL WASHINGS;  Surgeon: Garner Nash, DO;  Location: [facility];  Service: Pulmonary;;   CESAREAN SECTION  1995/1996   CRANIOTOMY Right 08/04/2017   Procedure: Right Frontal craniotomy for resection of tumor with stereotactic navigation;  Surgeon: [name], [name], MD;  Location: [facility];  Service: Neurosurgery;  Laterality: Right;  Right Frontal craniotomy for resection of tumor with stereotactic navigation   IR CV LINE INJECTION  02/19/2021   IR IMAGING GUIDED PORT INSERTION  05/31/2019   LUNG LOBECTOMY Left 05/1996   upper lobe - due to [facility] Fever   PORT-A-CATH REMOVAL Right 08/02/2013   Procedure: REMOVAL PORT-A-CATH;  Surgeon: Edward Jolly, MD;  Location: [place];  Service: General;  Laterality: Right;   PORTACATH PLACEMENT  07/02/2012   Procedure: INSERTION PORT-A-CATH;  Surgeon: Edward Jolly, MD;  Location: [facility];  Service: General;  Laterality: N/A;  right   VIDEO BRONCHOSCOPY N/A 03/17/2020   Procedure: VIDEO BRONCHOSCOPY;  Surgeon: Garner Nash, DO;  Location: [place];  Service: Pulmonary;  Laterality: N/A;   VIDEO BRONCHOSCOPY WITH ENDOBRONCHIAL ULTRASOUND N/A 05/23/2018   Procedure: VIDEO BRONCHOSCOPY WITH ENDOBRONCHIAL ULTRASOUND;  Surgeon: Garner Nash, DO;  [facility];  Service: Thoracic;  Laterality: N/A;   Family History Family History  Problem Relation Age of Onset   Emphysema Mother        was a smoker   Heart disease Mother    Melanoma Mother        dx in her 79s   Breast cancer Mother 70   Asthma  Brother    Breast cancer Cousin        mother's maternal cousin; dx in her 87s    Social History Social History   Tobacco Use   Smoking status: Every Day    Packs/day: 0.30    Years: 38.00    Pack years: 11.40    Types: Cigarettes   Smokeless tobacco: Never   Tobacco comments:    10 cigarettes smoked daily 10/19/20 [facility]   Vaping Use   Vaping Use: Never used  Substance Use Topics   Alcohol use: No   Drug use: No   Allergies Aspirin, Protonix [pantoprazole], and Iodinated contrast media  Review of Systems Review of Systems  Respiratory:  Positive for cough.   Cardiovascular:  Positive for chest pain.   Physical Exam Vital Signs  I have reviewed the triage vital signs BP 106/73 (BP Location: Left Arm)    Pulse 77    Temp 98.3 F (36.8 C)    Resp 18    Ht 5\' 2"  (1.575 m)    Wt 41 kg    LMP 07/25/2012 Comment: pregnancy waiver form signed 01-18-2018   SpO2 99%    BMI 16.52 kg/m   Physical Exam Vitals and nursing note reviewed.  Constitutional:      General: She is not in acute distress.    Appearance: She is well-developed.  HENT:     Head: Normocephalic and atraumatic.  Eyes:     Conjunctiva/sclera: Conjunctivae normal.  Cardiovascular:     Rate and Rhythm: Normal rate and regular rhythm.     Heart sounds: No murmur heard. Pulmonary:     Effort: Pulmonary effort is normal. No respiratory distress.     Breath sounds: Examination of the left-lower field reveals rhonchi. Rhonchi present.  Abdominal:  Palpations: Abdomen is soft.     Tenderness: There is no abdominal tenderness.  Musculoskeletal:        General: No swelling.     Cervical back: Neck supple.  Skin:    General: Skin is warm and dry.     Capillary Refill: Capillary refill takes less than 2 seconds.  Neurological:     Mental Status: She is alert.  Psychiatric:        Mood and Affect: Mood normal.    ED Results and Treatments Labs (all labs ordered are listed, but only abnormal results are  displayed) Labs Reviewed  CBC - Abnormal; Notable for the following components:      Result Value   WBC 15.5 (*)    RBC 3.67 (*)    Hemoglobin 10.3 (*)    HCT 31.9 (*)    RDW 18.6 (*)    Platelets 470 (*)    All other components within normal limits  BASIC METABOLIC PANEL  TROPONIN I (HIGH SENSITIVITY)  TROPONIN I (HIGH SENSITIVITY)                                                                                                                          Radiology DG Chest 2 View  Result Date: 07/25/2021 CLINICAL DATA:  Chest pain, COPD, breast cancer EXAM: CHEST - 2 VIEW COMPARISON:  12/31/2019 chest radiograph. FINDINGS: Stable right internal jugular Port-A-Cath terminating in the lower third of the SVC. Stable cardiomediastinal silhouette with normal heart size. No pneumothorax. No right pleural effusion. Volume loss in the left hemithorax. New patchy consolidation at the left lung base. Increased round lucencies and streaky opacities in the upper left lung. Surgical clips overlie the left axilla. IMPRESSION: 1. New patchy consolidation at the left lung base, compatible with pneumonia in the correct clinical setting. 2. Increased round lucencies and streaky opacities in the upper left lung, compatible with progressive chronic cavitary disease, probably infectious, see 07/22/2021 chest CT report for details. Electronically Signed   By: Ilona Sorrel M.D.   On: 07/25/2021 10:57    Pertinent labs & imaging results that were available during my care of the patient were reviewed by me and considered in my medical decision making (see MDM for details).  Medications Ordered in ED Medications  azithromycin (ZITHROMAX) 500 mg in sodium chloride 0.9 % 250 mL IVPB (500 mg Intravenous New Bag/Given 07/25/21 1403)  cefTRIAXone (ROCEPHIN) 1 g in sodium chloride 0.9 % 100 mL IVPB (has no administration in time range)  predniSONE (DELTASONE) tablet 50 mg (has no administration in time range)   diphenhydrAMINE (BENADRYL) injection 25 mg (has no administration in time range)  famotidine (PEPCID) IVPB 20 mg premix (has no administration in time range)  morphine 4 MG/ML injection 3 mg (3 mg Intravenous Given 07/25/21 1257)  ondansetron (ZOFRAN) injection 4 mg (4 mg Intravenous Given 07/25/21 1255)  hydrocortisone sodium succinate (SOLU-CORTEF) 100 MG injection 200 mg (200 mg Intravenous Given 07/25/21 1307)  diphenhydrAMINE (BENADRYL) capsule 50 mg (50 mg Oral Given 07/25/21 1531)    Or  diphenhydrAMINE (BENADRYL) injection 50 mg ( Intravenous See Alternative 07/25/21 1531)  cefTRIAXone (ROCEPHIN) 1 g in sodium chloride 0.9 % 100 mL IVPB (0 g Intravenous Stopped 07/25/21 1356)                                                                                                                                     Procedures .Critical Care Performed by: Teressa Lower, MD Authorized by: Teressa Lower, MD   Critical care provider statement:    Critical care time (minutes):  30   Critical care was time spent personally by me on the following activities:  Development of treatment plan with patient or surrogate, discussions with consultants, evaluation of patient's response to treatment, examination of patient, ordering and review of laboratory studies, ordering and review of radiographic studies, ordering and performing treatments and interventions, pulse oximetry, re-evaluation of patient's condition and review of old charts  (including critical care time)  Medical Decision Making / ED Course   This patient presents to the ED for concern of chest pain shortness of breath, this involves an extensive number of treatment options, and is a complaint that carries with it a high risk of complications and morbidity.  The differential diagnosis includes pneumonia, PE, ACS, pneumothorax, worsening metastatic disease  MDM: Patient seen emergency department for evaluation of chest pain shortness of  breath.  Physical exam reveals rhonchi in the left lower lobe but is otherwise unremarkable.  Laboratory evaluation with a leukocytosis to 15.5 which may be elevated in the setting of her recent steroid use needed and her pretreatment for her recent CT scan, cannot rule out reactive leukocytosis at this time.  Chemistry unremarkable, high sensitive troponin unremarkable.  Chest x-ray with worsening developing pneumonia or consolidation in the left lower lobe.  I spoke directly with radiology to confirm that this x-ray is worse when compared to the CT scan and the radiologist did confirm that this is worse today.  In the setting of her active cancer, cannot rule out a wedge infarct from a PE and the patient will require a CT PE.  She will require pretreatment for this.  Patient started empirically on ceftriaxone azithromycin and require admission for suspected pneumonia in the setting of an immunocompromise host.  Patient admitted.   Additional history obtained: -Additional history obtained from husband -External records from outside source obtained and reviewed including: Chart review including previous notes, labs, imaging, consultation notes   Lab Tests: -I ordered, reviewed, and interpreted labs.   The pertinent results include:   Labs Reviewed  CBC - Abnormal; Notable for the following components:      Result Value   WBC 15.5 (*)    RBC 3.67 (*)    Hemoglobin 10.3 (*)    HCT 31.9 (*)    RDW 18.6 (*)  Platelets 470 (*)    All other components within normal limits  BASIC METABOLIC PANEL  TROPONIN I (HIGH SENSITIVITY)  TROPONIN I (HIGH SENSITIVITY)      EKG   EKG Interpretation  Date/Time:  Sunday July 25 2021 11:36:33 EST Ventricular Rate:  76 PR Interval:  122 QRS Duration: 91 QT Interval:  405 QTC Calculation: 456 R Axis:   70 Text Interpretation: Sinus rhythm Confirmed by [place] ([id number]) on 07/25/2021 4:12:35 PM         Imaging Studies ordered: I ordered  imaging studies including chest XR, CTPE I independently visualized and interpreted imaging. I agree with the radiologist interpretation   Medicines ordered and prescription drug management: Meds ordered this encounter  Medications   DISCONTD: cefTRIAXone (ROCEPHIN) 2 g in sodium chloride 0.9 % 100 mL IVPB    Order Specific Question:   Antibiotic Indication:    Answer:   CAP   DISCONTD: azithromycin (ZITHROMAX) 500 mg in sodium chloride 0.9 % 250 mL IVPB    Order Specific Question:   Antibiotic Indication:    Answer:   CAP   DISCONTD: predniSONE (DELTASONE) tablet 50 mg   DISCONTD: diphenhydrAMINE (BENADRYL) capsule 50 mg   DISCONTD: diphenhydrAMINE (BENADRYL) injection 50 mg   morphine 4 MG/ML injection 3 mg   ondansetron (ZOFRAN) injection 4 mg   hydrocortisone sodium succinate (SOLU-CORTEF) 100 MG injection 200 mg    IV hydrocortisone will be converted to either a q8h or q12h frequency with the same total daily dose (TDD).  Ordered Dose: 1 to 200 mg TDD; convert to: TDD div q12h.  Ordered Dose: 201 to 300 mg TDD; convert to: TDD div q8h.  Ordered Dose: >300 mg TDD; DAW.   OR Linked Order Group    diphenhydrAMINE (BENADRYL) capsule 50 mg    diphenhydrAMINE (BENADRYL) injection 50 mg   cefTRIAXone (ROCEPHIN) 1 g in sodium chloride 0.9 % 100 mL IVPB    Order Specific Question:   Antibiotic Indication:    Answer:   CAP   azithromycin (ZITHROMAX) 500 mg in sodium chloride 0.9 % 250 mL IVPB    Order Specific Question:   Antibiotic Indication:    Answer:   CAP   cefTRIAXone (ROCEPHIN) 1 g in sodium chloride 0.9 % 100 mL IVPB    Order Specific Question:   Antibiotic Indication:    Answer:   CAP   predniSONE (DELTASONE) tablet 50 mg   diphenhydrAMINE (BENADRYL) injection 25 mg   famotidine (PEPCID) IVPB 20 mg premix    -I have reviewed the patients home medicines and have made adjustments as needed  Critical interventions Multiple abx  Consultations Obtained: I requested  consultation with the radiologist,  and discussed lab and imaging findings as well as pertinent plan - they recommend: CT PE CT pulmonary embolus study to evaluate for large infarct   Cardiac Monitoring: The patient was maintained on a cardiac monitor.  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/2020   Asthma    Breast cancer ([place])    Bronchiectasis ([place])    Bronchiolitis    Cancer ([place])  breast cancer 2014   Cancer, metastatic to liver ([place])    4132   Complication of anesthesia    bp dropped + desat    COPD (chronic obstructive pulmonary disease) ([facility])    Dyspnea    DOE   GERD (gastroesophageal reflux disease)    H/O coccidioidomycosis    was reason for lung lobectomy   Headache(784.0)    due to eye strain or not eating   History of anemia    no current problem   History of asthma    as a child   History of breast cancer 2014   left   History of chemotherapy    finished 07/17/2013   History of hiatal hernia    AGE 19   Hx of radiation therapy 03/25/13-05/06/13   left breast 5000 cGy/25 sessions, left breast boost 1000 cGy/5 sessions   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   Pneumonia    LAST FLARE UP 01/2018   Rash 02/22/2021   Runny nose 07/30/2013   clear drainage   Smoker 05/24/2021   Wears dentures    upper      Dispostion: admit     Final Clinical Impression(s) / ED Diagnoses Final diagnoses:  None     @PCDICTATION @    Teressa Lower, MD 07/25/21 1615

## 2021-07-25 NOTE — H&P (Addendum)
Patient Demographics:    Melissa [name], is a 56 y.o. 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[place])    1)Pleuritic Chest Pain-rule out  PE with infarction, patient has contrast allergy will premedicate prior to CTA chest -This may be infectious in etiology given history of underlying aspergillosis and  coccidiomycosis -Cannot entirely rule out superimposed bacterial infection/Pneumonia -Treat empirically with Rocephin and azithromycin pending further imaging and culture data -Continue bronchodilators, mucolytics -Continue PTA voriconazole for already known as aspergillosis and  coccidiomycosis -Leukocytosis may be due to steroid use, history review reveals persistent leukocytosis at baseline for this patient -EKG sinus rhythm without acute findings, troponins are not elevated, and characteristics of chest pain not suggestive of ACS  2) history of left breast cancer with prior lumpectomy--also patient has brain mets -Currently undergoing chemo and radiation therapy  3) chronic anemia associated with neoplasm and ongoing chemoradiation therapy----hemoglobin 10.3 which is higher than recent baseline -Monitor closely and transfuse as clinically indicated  4) COPD and tobacco abuse--- smoking cessation  advised, bronchodilators as above #1  Disposition/Need for in-Hospital Stay- patient unable to be discharged at this time due to --possible pneumonia versus pulmonary embolism requiring CTA chest pending premedication for contrast allergy -Currently requiring IV antibiotics and IV fluids  Dispo: The patient is from: Home              Anticipated d/c is to: Home              Anticipated d/c date is: 2 days              Patient currently is not medically stable to d/c. 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[place]) 02/26/2020   Asthma    Breast cancer ([place])    Bronchiectasis ([place])    Bronchiolitis    Cancer ([place])    breast cancer 2014   Cancer, metastatic to liver ([place])    8616   Complication of anesthesia    bp dropped + desat    COPD (chronic obstructive pulmonary disease) ([place])    Dyspnea    DOE   GERD (gastroesophageal reflux disease)    H/O coccidioidomycosis  was reason for lung lobectomy   Headache(784.0)    due to eye strain or not eating   History of anemia    no current problem   History of asthma    as a child   History of breast cancer 2014   left   History of chemotherapy    finished 07/17/2013   History of hiatal hernia    AGE 16   Hx of radiation therapy 03/25/13-05/06/13   left breast 5000 cGy/25 sessions, left breast boost 1000 cGy/5 sessions   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   Pneumonia    LAST FLARE UP 01/2018   Rash 02/22/2021   Runny nose 07/30/2013   clear drainage   Smoker 05/24/2021   Wears dentures    upper      Past Surgical History:  Procedure Laterality Date   APPLICATION OF CRANIAL NAVIGATION N/A 08/04/2017   Procedure: APPLICATION OF CRANIAL NAVIGATION;  Surgeon: [name], [name], MD;  Location: [facility];  Service: Neurosurgery;  Laterality: N/A;   AXILLARY LYMPH NODE DISSECTION Left 02/08/2013   Procedure: LEFT AXILLARY  DISSECTION;  Surgeon: Edward Jolly, MD;  Location: [facility];  Service: General;  Laterality: Left;   BREAST CYST EXCISION Right 2006   BREAST LUMPECTOMY Left 2014   BREAST LUMPECTOMY WITH NEEDLE LOCALIZATION AND AXILLARY SENTINEL LYMPH NODE BX Left 12/31/2012   Procedure: NEEDLE LOCALIZATION LEFT BREAST LUMPECTOMY AND LEFT AXILLARY SENTENIAL LYMPH NODE BX;  Surgeon: Edward Jolly, MD;  Location: [facility];  Service: General;  Laterality: Left;   BRONCHIAL BRUSHINGS  03/17/2020   Procedure: BRONCHIAL BRUSHINGS;  Surgeon: Garner Nash, DO;  Location: [facility];  Service: Pulmonary;;   BRONCHIAL WASHINGS  03/17/2020   Procedure: BRONCHIAL WASHINGS;  Surgeon: Garner Nash, DO;  Location: [facility];  Service: Pulmonary;;   CESAREAN SECTION  1995/1996   CRANIOTOMY Right 08/04/2017   Procedure: Right Frontal craniotomy for resection of tumor with stereotactic navigation;  Surgeon: [name], [name], MD;  Location: [facility];  Service: Neurosurgery;  Laterality: Right;  Right Frontal craniotomy for resection of tumor with stereotactic navigation   IR CV LINE INJECTION  02/19/2021   IR IMAGING GUIDED PORT INSERTION  05/31/2019   LUNG LOBECTOMY Left 05/1996   upper lobe - due to [facility] Fever   PORT-A-CATH REMOVAL Right 08/02/2013   Procedure: REMOVAL PORT-A-CATH;  Surgeon: Edward Jolly, MD;  Location: [facility];  Service: General;  Laterality: Right;   PORTACATH PLACEMENT  07/02/2012   Procedure: INSERTION PORT-A-CATH;  Surgeon: Edward Jolly, MD;  Location: [facility];  Service: General;  Laterality: N/A;  right   VIDEO BRONCHOSCOPY N/A 03/17/2020   Procedure: VIDEO BRONCHOSCOPY;  Surgeon: Garner Nash, DO;  Location: [facility];  Service: Pulmonary;  Laterality: N/A;   VIDEO BRONCHOSCOPY WITH ENDOBRONCHIAL ULTRASOUND N/A 05/23/2018   Procedure: VIDEO BRONCHOSCOPY WITH ENDOBRONCHIAL ULTRASOUND;  Surgeon:  Garner Nash, DO;  Location: [facility];  Service: Thoracic;  Laterality: N/A;    Chief Complaint  Patient presents with   Chest Pain      HPI:    Melissa [name]  is a 56 y.o. 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/Initial Ca 1997, Brain Mets 2019--currently undergoing chemo and radiation therapy--as well as history of aspergillosis and  coccidiomycosis--on voriconazole chronically--to the ED with left-sided chest neck area discomfort especially with inspiration/breathing- -Her cough is no different than her  baseline cough she is a smoker with underlying lung disease No fever  Or chills  -Chest pain is not exertional, no dizziness no palpitations no shortness of breath no dyspnea on exertion no leg pains or leg swelling -In ED chest x-ray suggestive of possible infection -CTA chest pending but patient has contrast allergy she needs to be premedicated prior to CTA chest -Chemistry and creatinine unremarkable -WBC 15.2, patient has history of chronic leukocytosis, she uses steroids from time to time with a chemo treatments and recently had steroids prior to contrast study -Hemoglobin is 10.3 which is higher than recent baseline -EP give antibiotics request after ligation pending further imaging of culture data   Review of systems:    In addition to the HPI above,   A full Review of  Systems was done, all other systems reviewed are negative except as noted above in HPI , .    Social History:  Reviewed by me    Social History   Tobacco Use   Smoking status: Every Day    Packs/day: 0.30    Years: 38.00    Pack years: 11.40    Types: Cigarettes   Smokeless tobacco: Never   Tobacco comments:    10 cigarettes smoked daily 10/19/20 [facility]   Substance Use Topics   Alcohol use: No       Family History :  Reviewed by me    Family History  Problem Relation Age of Onset   Emphysema Mother        was a smoker   Heart disease Mother     Melanoma Mother        dx in her 62s   Breast cancer Mother 20   Asthma Brother    Breast cancer Cousin        mother's maternal cousin; dx in her 76s     Home Medications:   Prior to Admission medications   Medication Sig Start Date End Date Taking? Authorizing Provider  albuterol (VENTOLIN HFA) 108 (90 Base) MCG/ACT inhaler Inhale 2 puffs into the lungs every 6 (six) hours as needed. 11/03/20  Yes Icard, Octavio Graves, DO  budesonide-formoterol (SYMBICORT) 160-4.5 MCG/ACT inhaler Inhale 2 puffs into the lungs 2 (two) times daily. 11/23/20  Yes Icard, Octavio Graves, DO  chlorpheniramine-HYDROcodone (TUSSIONEX) 10-8 MG/5ML SUER Take 5 mLs by mouth at bedtime. 06/25/21  Yes Nicholas Lose, MD  diphenoxylate-atropine (LOMOTIL) 2.5-0.025 MG tablet Take 1 tablet by mouth 4 (four) times daily as needed for diarrhea or loose stools. 06/11/21  Yes Nicholas Lose, MD  lidocaine-prilocaine (EMLA) cream APPLY EXTERNALLY TO THE AFFECTED AREA 1 TIME 08/19/20  Yes Nicholas Lose, MD  Multiple Vitamins-Minerals (EMERGEN-C VITAMIN C PO) Take by mouth.   Yes [provider]  naproxen sodium (ALEVE) 220 MG tablet Take 440 mg by mouth 2 (two) times daily as needed (headache).    Yes [provider]  Neratinib Maleate (NERLYNX) 40 MG tablet Take 3 tablets (120 mg total) by mouth daily. Take with food. 05/24/21  Yes Nicholas Lose, MD  predniSONE (DELTASONE) 50 MG tablet 1 tab 12 hours before scan, 1 tab 7 hours before and 1 tab 1 hour before 05/24/21  Yes Nicholas Lose, MD  Vitamin E 200 units TABS Take by mouth.   Yes [provider]  levalbuterol (XOPENEX) 0.63 MG/3ML nebulizer solution Take 3 mLs (0.63 mg total) by nebulization every 4 (four) hours as needed for wheezing or shortness of breath. Patient not taking: Reported on 05/03/2021  04/20/18   Martyn Ehrich, NP  megestrol (MEGACE) 20 MG tablet Take 1 tablet (20 mg total) by mouth daily. Patient not taking: Reported on 05/03/2021 03/01/21    Nicholas Lose, MD  voriconazole (VFEND) 200 MG tablet Take 1 tablet (200 mg total) by mouth 2 (two) times daily. Patient not taking: Reported on 07/25/2021 05/24/21   Tommy Medal, Lavell Islam, MD     Allergies:     Allergies  Allergen Reactions   Aspirin Anaphylaxis and Shortness Of Breath    THROAT CLOSES   Protonix [Pantoprazole] Nausea Only and Other (See Comments)    Also caused a "film in the mouth" and caused chest pressure   Iodinated Contrast Media Rash    Patient allergic to contrast used in radiation oncology for CT simulation      Physical Exam:   Vitals  Blood pressure 106/73, pulse 77, temperature 98.3 F (36.8 C), resp. rate 18, height 5\' 2"  (1.575 m), weight 41 kg, last menstrual period 07/25/2012, SpO2 99 %.  Physical Examination: General appearance - alert, cachectic and in no distress  Mental status - alert, oriented to person, place, and time,  Eyes - sclera anicteric Neck - supple, no JVD elevation , Chest -diminished breath sounds actually on the left, no wheezing, no significant rhonchi Heart - S1 and S2 normal, regular , right-sided Port-A-Cath in situ Abdomen - soft, nontender, nondistended, no masses or organomegaly Neurological - screening mental status exam normal, neck supple without rigidity, cranial nerves II through XII intact, DTR's normal and symmetric Extremities - no pedal edema noted, intact peripheral pulses  Skin - warm, dry     Data Review:    CBC Recent Labs  Lab 07/25/21 1045  WBC 15.5*  HGB 10.3*  HCT 31.9*  PLT 470*  MCV 86.9  MCH 28.1  MCHC 32.3  RDW 18.6*   ------------------------------------------------------------------------------------------------------------------  Chemistries  Recent Labs  Lab 07/25/21 1045  NA 136  K 4.1  CL 103  CO2 28  GLUCOSE 88  BUN 12  CREATININE 0.80  CALCIUM 9.1    ------------------------------------------------------------------------------------------------------------------ estimated creatinine clearance is 52.6 mL/min (by C-G formula based on SCr of 0.8 mg/dL). ------------------------------------------------------------------------------------------------------------------ No results for input(s): TSH, T4TOTAL, T3FREE, THYROIDAB in the last 72 hours.  Invalid input(s): FREET3   Coagulation profile No results for input(s): INR, PROTIME in the last 168 hours. ------------------------------------------------------------------------------------------------------------------- No results for input(s): DDIMER in the last 72 hours. -------------------------------------------------------------------------------------------------------------------  Cardiac Enzymes No results for input(s): CKMB, TROPONINI, MYOGLOBIN in the last 168 hours.  Invalid input(s): CK ------------------------------------------------------------------------------------------------------------------ No results found for: BNP   ---------------------------------------------------------------------------------------------------------------  Urinalysis    Component Value Date/Time   COLORURINE YELLOW 08/08/2017 0054   APPEARANCEUR CLEAR 08/08/2017 0054   LABSPEC 1.008 08/08/2017 0054   PHURINE 8.0 08/08/2017 0054   GLUCOSEU NEGATIVE 08/08/2017 0054   HGBUR NEGATIVE 08/08/2017 0054   BILIRUBINUR NEGATIVE 08/08/2017 0054   KETONESUR NEGATIVE 08/08/2017 0054   PROTEINUR NEGATIVE 08/08/2017 0054   UROBILINOGEN 0.2 04/21/2007 1141   NITRITE NEGATIVE 08/08/2017 0054   LEUKOCYTESUR NEGATIVE 08/08/2017 0054    ----------------------------------------------------------------------------------------------------------------   Imaging Results:    DG Chest 2 View  Result Date: 07/25/2021 CLINICAL DATA:  Chest pain, COPD, breast cancer EXAM: CHEST - 2 VIEW COMPARISON:   12/31/2019 chest radiograph. FINDINGS: Stable right internal jugular Port-A-Cath terminating in the lower third of the SVC. Stable cardiomediastinal silhouette with normal heart size. No pneumothorax. No right pleural effusion. Volume loss in the left hemithorax. New  patchy consolidation at the left lung base. Increased round lucencies and streaky opacities in the upper left lung. Surgical clips overlie the left axilla. IMPRESSION: 1. New patchy consolidation at the left lung base, compatible with pneumonia in the correct clinical setting. 2. Increased round lucencies and streaky opacities in the upper left lung, compatible with progressive chronic cavitary disease, probably infectious, see 07/22/2021 chest CT report for details. Electronically Signed   By: Ilona Sorrel M.D.   On: 07/25/2021 10:57    Radiological Exams on Admission: DG Chest 2 View  Result Date: 07/25/2021 CLINICAL DATA:  Chest pain, COPD, breast cancer EXAM: CHEST - 2 VIEW COMPARISON:  12/31/2019 chest radiograph. FINDINGS: Stable right internal jugular Port-A-Cath terminating in the lower third of the SVC. Stable cardiomediastinal silhouette with normal heart size. No pneumothorax. No right pleural effusion. Volume loss in the left hemithorax. New patchy consolidation at the left lung base. Increased round lucencies and streaky opacities in the upper left lung. Surgical clips overlie the left axilla. IMPRESSION: 1. New patchy consolidation at the left lung base, compatible with pneumonia in the correct clinical setting. 2. Increased round lucencies and streaky opacities in the upper left lung, compatible with progressive chronic cavitary disease, probably infectious, see 07/22/2021 chest CT report for details. Electronically Signed   By: Ilona Sorrel M.D.   On: 07/25/2021 10:57    DVT Prophylaxis -SCD/heparin AM Labs Ordered, also please review Full Orders  Family Communication: Admission, patients condition and plan of care including  tests being ordered have been discussed with the patient  who indicate understanding and agree with the plan   Code Status - Full Code  Likely DC to home  Condition   -stable  Roxan Hockey M.D on 07/25/2021 at 5:14 PM Go to [email or web address] -  for contact info  [facility] - Office  [phone]

## 2021-07-25 NOTE — ED Provider Triage Note (Signed)
Emergency Medicine Provider Triage Evaluation Note  Melissa [name] , a 56 y.o. female  was evaluated in triage.  Pt complains of left-sided chest pain that is progressively worsened since Friday.  She denies fever, or worsening shortness of breath.  She is actively being treated for Aspergillus and has been on treatment for 2 years.  Recently got CT scan a couple days ago for routine monitoring which showed potential worsening infection.  Reports history of pleurisy.  She tried Aleve without relief.  Reports she at baseline has productive cough including hemoptysis.  This has not changed.  Review of Systems  Positive: As above Negative: As above  Physical Exam  BP (!) 141/121 (BP Location: Right Arm)    Pulse 92    Temp 97.9 F (36.6 C) (Oral)    Resp 18    Ht 5\' 2"  (1.575 m)    Wt 41 kg    LMP 07/25/2012 Comment: pregnancy waiver form signed 01-18-2018   SpO2 99%    BMI 16.52 kg/m  Gen:   Awake, no distress   Resp:  Normal effort  MSK:   Moves extremities without difficulty  Other:    Medical Decision Making  Medically screening exam initiated at 11:04 AM.  Appropriate orders placed.  Melissa [name] was informed that the remainder of the evaluation will be completed by another provider, this initial triage assessment does not replace that evaluation, and the importance of remaining in the ED until their evaluation is complete.     Evlyn Courier, PA-C 07/25/21 1105

## 2021-07-26 ENCOUNTER — Other Ambulatory Visit (HOSPITAL_COMMUNITY): Payer: 59

## 2021-07-26 ENCOUNTER — Telehealth: Payer: Self-pay | Admitting: Hematology and Oncology

## 2021-07-26 DIAGNOSIS — B449 Aspergillosis, unspecified: Secondary | ICD-10-CM | POA: Diagnosis not present

## 2021-07-26 DIAGNOSIS — J984 Other disorders of lung: Secondary | ICD-10-CM | POA: Diagnosis not present

## 2021-07-26 DIAGNOSIS — J189 Pneumonia, unspecified organism: Secondary | ICD-10-CM | POA: Diagnosis not present

## 2021-07-26 LAB — HIV ANTIBODY (ROUTINE TESTING W REFLEX): HIV Screen 4th Generation wRfx: NONREACTIVE

## 2021-07-26 LAB — BASIC METABOLIC PANEL
Anion gap: 5 (ref 5–15)
BUN: 13 mg/dL (ref 6–20)
CO2: 23 mmol/L (ref 22–32)
Calcium: 7.9 mg/dL — ABNORMAL LOW (ref 8.9–10.3)
Chloride: 108 mmol/L (ref 98–111)
Creatinine, Ser: 0.63 mg/dL (ref 0.44–1.00)
GFR, Estimated: >60 mL/min (ref >60)
Glucose, Bld: 152 mg/dL — ABNORMAL HIGH (ref 70–99)
Potassium: 3.8 mmol/L (ref 3.5–5.1)
Sodium: 136 mmol/L (ref 135–145)

## 2021-07-26 LAB — CBC
HCT: 25.4 % — ABNORMAL LOW (ref 36.0–46.0)
Hemoglobin: 8.1 g/dL — ABNORMAL LOW (ref 12.0–15.0)
MCH: 27.6 pg (ref 26.0–34.0)
MCHC: 31.9 g/dL (ref 30.0–36.0)
MCV: 86.7 fL (ref 80.0–100.0)
Platelets: 341 10*3/uL (ref 150–400)
RBC: 2.93 MIL/uL — ABNORMAL LOW (ref 3.87–5.11)
RDW: 18.6 % — ABNORMAL HIGH (ref 11.5–15.5)
WBC: 8.7 10*3/uL (ref 4.0–10.5)
nRBC: 0 % (ref 0.0–0.2)

## 2021-07-26 MED ORDER — BUDESONIDE-FORMOTEROL FUMARATE 160-4.5 MCG/ACT IN AERO: 2.0000 | INHALATION_SPRAY | Freq: Two times a day (BID) | RESPIRATORY_TRACT | 3 refills | Status: DC

## 2021-07-26 MED ORDER — CEFDINIR 300 MG PO CAPS: 300.0000 mg | ORAL_CAPSULE | Freq: Two times a day (BID) | ORAL | 0 refills | Status: DC

## 2021-07-26 MED ORDER — CEFTRIAXONE SODIUM 2 G IJ SOLR
2.0000 g | Freq: Once | INTRAMUSCULAR | Status: AC
  Administered 2021-07-26: 2 g via INTRAVENOUS
  Filled 2021-07-26: qty 20

## 2021-07-26 MED ORDER — ALBUTEROL SULFATE HFA 108 (90 BASE) MCG/ACT IN AERS: 2.0000 | INHALATION_SPRAY | Freq: Four times a day (QID) | RESPIRATORY_TRACT | 5 refills | Status: AC | PRN

## 2021-07-26 MED ORDER — DOXYCYCLINE HYCLATE 100 MG PO TABS: 100.0000 mg | ORAL_TABLET | Freq: Two times a day (BID) | ORAL | 0 refills | Status: DC

## 2021-07-26 MED ORDER — ACETAMINOPHEN 325 MG PO TABS: 650.0000 mg | ORAL_TABLET | Freq: Four times a day (QID) | ORAL | 0 refills | Status: AC | PRN

## 2021-07-26 MED ORDER — AZITHROMYCIN 250 MG PO TABS
500.0000 mg | ORAL_TABLET | Freq: Once | ORAL | Status: AC
  Administered 2021-07-26: 500 mg via ORAL
  Filled 2021-07-26: qty 2

## 2021-07-26 MED ORDER — PROMETHAZINE-CODEINE 6.25-10 MG/5ML PO SOLN: 5.0000 mL | Freq: Two times a day (BID) | ORAL | 0 refills | Status: DC | PRN

## 2021-07-26 NOTE — Plan of Care (Signed)

## 2021-07-26 NOTE — TOC Progression Note (Signed)
°  Transition of Care [facility]) Screening Note   Patient Details  Name: Melissa [name] Date of Birth: 1965/09/05   Transition of Care [facility]) CM/SW Contact:    Shade Flood, LCSW Phone Number: 07/26/2021, 11:42 AM    Spoke with pt to update on Code 44. She does not report any TOC needs for dc.  Transition of Care Department [facility]) has reviewed patient and no TOC needs have been identified at this time. We will continue to monitor patient advancement through interdisciplinary progression rounds. If new patient transition needs arise, please place a TOC consult.

## 2021-07-26 NOTE — Discharge Instructions (Signed)
1)Take Omnicef/Cefdinir and Doxycycline antibiotic as prescribed for Pneumonia--start oral antibiotics on Tuesday 07/27/2021 (Not today) 2)Please follow up with Dr Lindi Adie (Oncologist) and Dr Tommy Medal as advised 3)Repeat CBC and BMP Blood work advised on Friday 07/30/21

## 2021-07-26 NOTE — Care Management CC44 (Signed)
Condition Code 44 Documentation Completed  Patient Details  Name: Melissa [name] MRN: [id number] Date of Birth: 10-21-1965   Condition Code 44 given:  Yes Patient signature on Condition Code 44 notice:  Yes Documentation of 2 MD's agreement:  Yes Code 44 added to claim:  Yes    Shade Flood, LCSW 07/26/2021, 11:40 AM

## 2021-07-26 NOTE — Care Management Obs Status (Signed)
[facility] NOTIFICATION   Patient Details  Name: Melissa [name] MRN: [id number] Date of Birth: 1965/11/07   Medicare Observation Status Notification Given:  Yes    Shade Flood, LCSW 07/26/2021, 11:40 AM

## 2021-07-26 NOTE — Telephone Encounter (Signed)
Scheduled appointment per 1/16 scheduling message. Patient is aware.

## 2021-07-26 NOTE — Discharge Summary (Signed)
Melissa [name], is a 56 y.o. female  DOB [birth year]  MRN [id number].  Admission date:  07/25/2021  Admitting Physician  Roxan Hockey, MD  Discharge Date:  07/26/2021   Primary MD  Patient, [name]  Recommendations for primary care physician for things to follow:   1)Take Omnicef/Cefdinir and Doxycycline antibiotic as prescribed for Pneumonia--start oral antibiotics on Tuesday 07/27/2021 (Not today) 2)Please follow up with Dr Lindi Adie (Oncologist) and Dr Tommy Medal as advised 3)Repeat CBC and BMP Blood work advised on Friday 07/30/21   Admission Diagnosis  Pneumonia [J18.9]   Discharge Diagnosis  Pneumonia [J18.9]    Principal Problem:   Pneumonia Active Problems:   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 ---s/p Lumpectomy/ /Initial Ca 1997, Brain Mets 2019   Brain metastasis (Lt Breast Ca Primary)   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/Initial Ca 1997, Brain Mets 2019   Cavitary lesion of lung in area of previous cyts in apex of Sup Segment of LLL   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[place])      Past Medical History:  Diagnosis Date   Anemia    Arthritis    knees and hips   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[place]) 02/26/2020   Asthma    Breast cancer ([place])    Bronchiectasis ([place])    Bronchiolitis    Cancer ([place])    breast cancer 2014   Cancer, metastatic to liver ([place])    7782   Complication of anesthesia    bp dropped + desat    COPD (chronic obstructive pulmonary disease) ([place])    Dyspnea    DOE   GERD (gastroesophageal reflux disease)    H/O coccidioidomycosis    was reason for lung lobectomy   Headache(784.0)    due to eye strain or not eating   History of anemia    no current problem   History of asthma    as a child   History of breast cancer 2014   left   History of chemotherapy    finished 07/17/2013   History of hiatal hernia    AGE 32   Hx of radiation  therapy 03/25/13-05/06/13   left breast 5000 cGy/25 sessions, left breast boost 1000 cGy/5 sessions   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   Pneumonia    LAST FLARE UP 01/2018   Rash 02/22/2021   Runny nose 07/30/2013   clear drainage   Smoker 05/24/2021   Wears dentures    upper    Past Surgical History:  Procedure Laterality Date   APPLICATION OF CRANIAL NAVIGATION N/A 08/04/2017   Procedure: APPLICATION OF CRANIAL NAVIGATION;  Surgeon: [name], [name], MD;  Location: [facility];  Service: Neurosurgery;  Laterality: N/A;   AXILLARY LYMPH NODE DISSECTION Left 02/08/2013   Procedure: LEFT AXILLARY DISSECTION;  Surgeon: Edward Jolly, MD;  Location: [facility];  Service: General;  Laterality: Left;   BREAST CYST EXCISION Right 2006   BREAST LUMPECTOMY Left 2014   BREAST LUMPECTOMY WITH NEEDLE  LOCALIZATION AND AXILLARY SENTINEL LYMPH NODE BX Left 12/31/2012   Procedure: NEEDLE LOCALIZATION LEFT BREAST LUMPECTOMY AND LEFT AXILLARY SENTENIAL LYMPH NODE BX;  Surgeon: Edward Jolly, MD;  Location: [facility];  Service: General;  Laterality: Left;   BRONCHIAL BRUSHINGS  03/17/2020   Procedure: BRONCHIAL BRUSHINGS;  Surgeon: Garner Nash, DO;  Location: [facility];  Service: Pulmonary;;   BRONCHIAL WASHINGS  03/17/2020   Procedure: BRONCHIAL WASHINGS;  Surgeon: Garner Nash, DO;  Location: [facility];  Service: Pulmonary;;   CESAREAN SECTION  1995/1996   CRANIOTOMY Right 08/04/2017   Procedure: Right Frontal craniotomy for resection of tumor with stereotactic navigation;  Surgeon: [name], [name], MD;  Location: [facility];  Service: Neurosurgery;  Laterality: Right;  Right Frontal craniotomy for resection of tumor with stereotactic navigation   IR CV LINE INJECTION  02/19/2021   IR IMAGING GUIDED PORT INSERTION  05/31/2019   LUNG LOBECTOMY Left 05/1996   upper lobe - due to [facility] Fever   PORT-A-CATH REMOVAL Right 08/02/2013   Procedure: REMOVAL PORT-A-CATH;   Surgeon: Edward Jolly, MD;  Location: [facility];  Service: General;  Laterality: Right;   PORTACATH PLACEMENT  07/02/2012   Procedure: INSERTION PORT-A-CATH;  Surgeon: Edward Jolly, MD;  Location: [facility];  Service: General;  Laterality: N/A;  right   VIDEO BRONCHOSCOPY N/A 03/17/2020   Procedure: VIDEO BRONCHOSCOPY;  Surgeon: Garner Nash, DO;  Location: [facility];  Service: Pulmonary;  Laterality: N/A;   VIDEO BRONCHOSCOPY WITH ENDOBRONCHIAL ULTRASOUND N/A 05/23/2018   Procedure: VIDEO BRONCHOSCOPY WITH ENDOBRONCHIAL ULTRASOUND;  Surgeon: Garner Nash, DO;  Location: [facility];  Service: Thoracic;  Laterality: N/A;       HPI  from the history and physical done on the day of admission:   Melissa [name]  is a 56 y.o. 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/Initial Ca 1997, Brain Mets 2019--currently undergoing chemo and radiation therapy--as well as history of aspergillosis and  coccidiomycosis--on voriconazole chronically--to the ED with left-sided chest neck area discomfort especially with inspiration/breathing- -Her cough is no different than her baseline cough she is a smoker with underlying lung disease No fever  Or chills  -Chest pain is not exertional, no dizziness no palpitations no shortness of breath no dyspnea on exertion no leg pains or leg swelling -In ED chest x-ray suggestive of possible infection -CTA chest pending but patient has contrast allergy she needs to be premedicated prior to CTA chest -Chemistry and creatinine unremarkable -WBC 15.2, patient has history of chronic leukocytosis, she uses steroids from time to time with a chemo treatments and recently had steroids prior to contrast study -Hemoglobin is 10.3 which is higher than recent baseline -EP give antibiotics request after ligation pending further imaging of culture data      Hospital Course:   A/p  1)Pleuritic Chest  Pain-rule out  PE with infarction, patient has contrast allergy so we premedicated prior to CTA chest -CTA chest without acute PE however does show new pneumonia presumed to be bacterial pneumonia superimposed on underlying chronic Aspergillosis and coccidiomycosis  -Patient was treated with Rocephin and azithromycin  -Also treated with bronchodilators, mucolytics -Continue PTA voriconazole for already known Aspergillosis and coccidiomycosis  -Leukocytosis resolved -EKG sinus rhythm without acute findings, troponins are not elevated, and characteristics of chest pain not suggestive of ACS -Discussed with patient's ID physician Dr. Drucilla Schmidt -We will discharge patient on  Omnicef and doxycycline with outpatient follow-up with Dr. Drucilla Schmidt   2) history of left breast cancer with prior lumpectomy--also patient has brain mets -Currently undergoing chemo and radiation therapy -Patient's oncologist Dr. Lindi Adie is aware of hospitalization   3) chronic anemia associated with neoplasm and ongoing chemoradiation therapy----hemoglobin down to 8.1 after hydration, this is not far from patient's baseline    4) COPD and tobacco abuse--- smoking cessation advised, bronchodilators as above #1   Disposition/--Home with husband   Dispo: The patient is from: Home              Anticipated d/c is to: Home  Discharge Condition: Stable without hypoxia  Follow UP   Follow-up Information     Tommy Medal, Lavell Islam, MD. Schedule an appointment as soon as possible for a visit in 1 week(s).   Specialty: Infectious Diseases Contact information: [address] [place] [address] [phone]                  Consults obtained -discussed with patient's ID physician Dr. Drucilla Schmidt,  Diet and Activity recommendation:  As advised  Discharge Instructions     Discharge Instructions     Call MD for:  difficulty breathing, headache or visual disturbances   Complete by: As directed    Call MD for:   persistant dizziness or light-headedness   Complete by: As directed    Call MD for:  persistant nausea and vomiting   Complete by: As directed    Call MD for:  temperature >100.4   Complete by: As directed    Diet - low sodium heart healthy   Complete by: As directed    Discharge instructions   Complete by: As directed    1)Take Omnicef/Cefdinir and Doxycycline antibiotic as prescribed for Pneumonia--start oral antibiotics on Tuesday 07/27/2021 (Not today) 2)Please follow up with Dr Lindi Adie (Oncologist) and Dr Tommy Medal as advised 3)Repeat CBC and BMP Blood work advised on Friday 07/30/21   Increase activity slowly   Complete by: As directed          Discharge Medications     Allergies as of 07/26/2021       Reactions   Aspirin Anaphylaxis, Shortness Of Breath   THROAT CLOSES   Protonix [pantoprazole] Nausea Only, Other (See Comments)   Also caused a "film in the mouth" and caused chest pressure   Iodinated Contrast Media Rash   Patient allergic to contrast used in radiation oncology for CT simulation        Medication List     STOP taking these medications    chlorpheniramine-HYDROcodone 10-8 MG/5ML Suer Commonly known as: TUSSIONEX Replaced by: Promethazine-Codeine 6.25-10 MG/5ML Soln   levalbuterol 0.63 MG/3ML nebulizer solution Commonly known as: Xopenex   megestrol 20 MG tablet Commonly known as: MEGACE   naproxen sodium 220 MG tablet Commonly known as: ALEVE   predniSONE 50 MG tablet Commonly known as: DELTASONE       TAKE these medications    acetaminophen 325 MG tablet Commonly known as: TYLENOL Take 2 tablets (650 mg total) by mouth every 6 (six) hours as needed for mild pain (or Fever >/= 101).   albuterol 108 (90 Base) MCG/ACT inhaler Commonly known as: Ventolin HFA Inhale 2 puffs into the lungs every 6 (six) hours as needed for wheezing or shortness of breath (cough). What changed: reasons to take this   budesonide-formoterol 160-4.5  MCG/ACT inhaler Commonly known as: SYMBICORT Inhale 2 puffs into the lungs  2 (two) times daily.   cefdinir 300 MG capsule Commonly known as: OMNICEF Take 1 capsule (300 mg total) by mouth 2 (two) times daily for 5 days.   diphenoxylate-atropine 2.5-0.025 MG tablet Commonly known as: LOMOTIL Take 1 tablet by mouth 4 (four) times daily as needed for diarrhea or loose stools.   doxycycline 100 MG tablet Commonly known as: VIBRA-TABS Take 1 tablet (100 mg total) by mouth 2 (two) times daily for 5 days.   EMERGEN-C VITAMIN C PO Take by mouth.   lidocaine-prilocaine cream Commonly known as: EMLA APPLY EXTERNALLY TO THE AFFECTED AREA 1 TIME   Neratinib Maleate 40 MG tablet Commonly known as: NERLYNX Take 3 tablets (120 mg total) by mouth daily. Take with food.   Promethazine-Codeine 6.25-10 MG/5ML Soln Take 5 mLs by mouth every 12 (twelve) hours as needed. Replaces: chlorpheniramine-HYDROcodone 10-8 MG/5ML Suer   Vitamin E 200 units Tabs Take by mouth.   voriconazole 200 MG tablet Commonly known as: VFEND Take 1 tablet (200 mg total) by mouth 2 (two) times daily.        Major procedures and Radiology Reports - PLEASE review detailed and final reports for all details, in brief -   DG Chest 2 View  Result Date: 07/25/2021 CLINICAL DATA:  Chest pain, COPD, breast cancer EXAM: CHEST - 2 VIEW COMPARISON:  12/31/2019 chest radiograph. FINDINGS: Stable right internal jugular Port-A-Cath terminating in the lower third of the SVC. Stable cardiomediastinal silhouette with normal heart size. No pneumothorax. No right pleural effusion. Volume loss in the left hemithorax. New patchy consolidation at the left lung base. Increased round lucencies and streaky opacities in the upper left lung. Surgical clips overlie the left axilla. IMPRESSION: 1. New patchy consolidation at the left lung base, compatible with pneumonia in the correct clinical setting. 2. Increased round lucencies and streaky  opacities in the upper left lung, compatible with progressive chronic cavitary disease, probably infectious, see 07/22/2021 chest CT report for details. Electronically Signed   By: Ilona Sorrel M.D.   On: 07/25/2021 10:57   CT Angio Chest PE W and/or Wo Contrast  Result Date: 07/25/2021 CLINICAL DATA:  Short of breath. Pain with inspiration. Symptoms beginning 2 days ago. EXAM: CT ANGIOGRAPHY CHEST WITH CONTRAST TECHNIQUE: Multidetector CT imaging of the chest was performed using the standard protocol during bolus administration of intravenous contrast. Multiplanar CT image reconstructions and MIPs were obtained to evaluate the vascular anatomy. RADIATION DOSE REDUCTION: This exam was performed according to the departmental dose-optimization program which includes automated exposure control, adjustment of the mA and/or kV according to patient size and/or use of iterative reconstruction technique. CONTRAST:  28mL OMNIPAQUE IOHEXOL 350 MG/ML SOLN COMPARISON:  07/22/2021 FINDINGS: Cardiovascular: Well opacified pulmonary arteries. There is no evidence of a pulmonary embolism. Heart normal in size and configuration. No pericardial effusion. Aorta normal in caliber. No dissection. No atherosclerosis. Arch branch vessels are widely patent. Mediastinum/Nodes: No neck base or mediastinal masses. Left hilar structures retracted superiorly. There is soft tissue surrounding the left hilar bronchi and vessels, as well as surgical vascular clips at the level of the left upper lobe bronchus. Findings consistent with a left upper lobectomy. An oval area of soft tissue adjacent to the superior left pulmonary vein appears new, 1.2 cm in short axis, possibly an enlarged lymph node. No right hilar mass or adenopathy. Trachea and esophagus are unremarkable. Lungs/Pleura: Patchy opacity in the left lung base has increased from the prior study, now associated with a  minimal pleural effusion. There are multiple left lung serpiginous  cystic lesions, several with air-fluid levels, stable from the prior exam. Right lung is hyperexpanded, essentially clear. Minor paraseptal emphysema noted at the right apex and mild centrilobular emphysema noted bilaterally. No right pleural effusion. No pneumothorax. Upper Abdomen: Unremarkable. Musculoskeletal: No fracture or acute finding. No bone lesion. No chest wall mass. Changes from previous left breast surgery. Review of the MIP images confirms the above findings. IMPRESSION: 1. No evidence of a pulmonary embolism. 2. Increased opacity at the left lung base associated a minimal pleural effusion. Pleural effusion is new and lung base opacity has increased from the prior exam, suspicious for pneumonia. 3. Multiple left lung serpiginous cystic lesions, several with dependent fluid. These are without change from the prior chest CT and suspected to be the sequelae of chronic atypical infection, particularly fungal. 4. Right lung remains clear. 5. Emphysema Emphysema (ICD10-J43.9). Electronically Signed   By: Lajean Manes M.D.   On: 07/25/2021 17:46   CT CHEST ABDOMEN PELVIS W CONTRAST  Result Date: 07/22/2021 CLINICAL DATA:  Metastatic breast cancer restaging EXAM: CT CHEST, ABDOMEN, AND PELVIS WITH CONTRAST TECHNIQUE: Multidetector CT imaging of the chest, abdomen and pelvis was performed following the standard protocol during bolus administration of intravenous contrast. RADIATION DOSE REDUCTION: This exam was performed according to the departmental dose-optimization program which includes automated exposure control, adjustment of the mA and/or kV according to patient size and/or use of iterative reconstruction technique. CONTRAST:  6mL OMNIPAQUE IOHEXOL 300 MG/ML SOLN, additional oral enteric contrast COMPARISON:  02/26/2021, 05/28/2020 FINDINGS: CT CHEST FINDINGS Cardiovascular: Right chest port catheter. Normal heart size. No pericardial effusion. Mediastinum/Nodes: No enlarged mediastinal, hilar, or  axillary lymph nodes. Thyroid gland, trachea, and esophagus demonstrate no significant findings. Lungs/Pleura: Redemonstrated postoperative findings of left upper lobectomy. Multiple generally thin walled, interconnected serpiginous cavitary lesions throughout the remaining left lower lobe are unchanged, some containing small air-fluid levels, with an interval increase in consolidation and nodularity in the dependent left lung base (series 4, image 100). Right lung is normally aerated. No pleural effusion or pneumothorax. Musculoskeletal: No chest wall mass. Unchanged subtle sclerotic lesion at the left aspect of the sternal body, at the site of a previously seen FDG avid osseous metastasis (series 5, image 27). Status post left lumpectomy and axillary dissection. CT ABDOMEN PELVIS FINDINGS Hepatobiliary: Unchanged retracted appearing, hypodense lesion adjacent to the gallbladder fossa, hepatic segment V, measuring 1.7 x 1.6 cm (series 2, image 75). No gallstones, gallbladder wall thickening, or biliary dilatation. Pancreas: Unremarkable. No pancreatic ductal dilatation or surrounding inflammatory changes. Spleen: Normal in size without significant abnormality. Adrenals/Urinary Tract: Adrenal glands are unremarkable. Kidneys are normal, without renal calculi, solid lesion, or hydronephrosis. Bladder is unremarkable. Stomach/Bowel: Stomach is within normal limits. Appendix appears normal. No evidence of bowel wall thickening, distention, or inflammatory changes. Vascular/Lymphatic: Aortic atherosclerosis. No enlarged abdominal or pelvic lymph nodes. Reproductive: No mass or other abnormality. Other: No abdominal wall hernia or abnormality. No ascites. Musculoskeletal: No acute osseous findings. IMPRESSION: 1. Redemonstrated postoperative findings of left upper lobectomy. 2. Multiple generally thin walled, interconnected serpiginous cavitary lesions throughout the remaining left lower lobe are unchanged, some  containing small air-fluid levels, with an interval increase in consolidation and nodularity in the dependent left lung base. Findings are consistent with chronic sequelae of atypical infection, particularly fungal, with interval evidence of ongoing, worsened infection or aspiration at the left lung base. 3. Unchanged treated liver metastasis adjacent to the gallbladder  fossa. 4. Unchanged subtle sclerotic lesion at the left aspect of the sternal body, at the site of a previously seen FDG avid osseous metastasis. 5. No evidence of new metastatic disease in the chest, abdomen, or pelvis. Aortic Atherosclerosis (ICD10-I70.0). Electronically Signed   By: Delanna Ahmadi M.D.   On: 07/22/2021 16:31    Micro Results  Recent Results (from the past 240 hour(s))  Resp Panel by RT-PCR (Flu A&B, Covid) Nasopharyngeal Swab     Status: None   Collection Time: 07/25/21  6:07 PM   Specimen: Nasopharyngeal Swab; Nasopharyngeal(NP) swabs in vial transport medium  Result Value Ref Range Status   SARS Coronavirus 2 by RT PCR NEGATIVE NEGATIVE Final    Comment: (NOTE) SARS-CoV-2 target nucleic acids are NOT DETECTED.  The SARS-CoV-2 RNA is generally detectable in upper respiratory specimens during the acute phase of infection. The lowest concentration of SARS-CoV-2 viral copies this assay can detect is 138 copies/mL. A negative result does not preclude SARS-Cov-2 infection and should not be used as the sole basis for treatment or other patient management decisions. A negative result may occur with  improper specimen collection/handling, submission of specimen other than nasopharyngeal swab, presence of viral mutation(s) within the areas targeted by this assay, and inadequate number of viral copies(<138 copies/mL). A negative result must be combined with clinical observations, patient history, and epidemiological information. The expected result is Negative.  Fact Sheet for Patients:   EntrepreneurPulse.com.au  Fact Sheet for Healthcare Providers:  IncredibleEmployment.be  This test is no t yet approved or cleared by the [place] FDA and  has been authorized for detection and/or diagnosis of SARS-CoV-2 by FDA under an Emergency Use Authorization (EUA). This EUA will remain  in effect (meaning this test can be used) for the duration of the COVID-19 declaration under Section 564(b)(1) of the Act, 21 U.S.C.section 360bbb-3(b)(1), unless the authorization is terminated  or revoked sooner.       Influenza A by PCR NEGATIVE NEGATIVE Final   Influenza B by PCR NEGATIVE NEGATIVE Final    Comment: (NOTE) The Xpert Xpress SARS-CoV-2/FLU/RSV plus assay is intended as an aid in the diagnosis of influenza from Nasopharyngeal swab specimens and should not be used as a sole basis for treatment. Nasal washings and aspirates are unacceptable for Xpert Xpress SARS-CoV-2/FLU/RSV testing.  Fact Sheet for Patients: EntrepreneurPulse.com.au  Fact Sheet for Healthcare Providers: IncredibleEmployment.be  This test is not yet approved or cleared by the [place] FDA and has been authorized for detection and/or diagnosis of SARS-CoV-2 by FDA under an Emergency Use Authorization (EUA). This EUA will remain in effect (meaning this test can be used) for the duration of the COVID-19 declaration under Section 564(b)(1) of the Act, 21 U.S.C. section 360bbb-3(b)(1), unless the authorization is terminated or revoked.  Performed at [facility], [address], [place], [place] [address]        Today   Subjective    Melissa [name] today has no new concerns  - Cough and shortness of breath improved, pleuritic chest pain pretty much resolved,        No fever  Or chills   No Nausea, Vomiting or Diarrhea   Patient has been seen and examined prior to discharge   Objective   Blood pressure (!) 91/57,  pulse 66, temperature 98 F (36.7 C), temperature source Oral, resp. rate 18, height 5\' 2"  (1.575 m), weight 42 kg, last menstrual period 07/25/2012, SpO2 96 %.   Intake/Output Summary (Last 24  hours) at 07/26/2021 1333 Last data filed at 07/26/2021 0900 Gross per 24 hour  Intake 1999.13 ml  Output --  Net 1999.13 ml    Exam Physical Examination: General appearance - alert, cachectic and in no distress  Mental status - alert, oriented to person, place, and time,  Eyes - sclera anicteric Neck - supple, no JVD elevation , Chest -improved air movement, no wheezing  heart - S1 and S2 normal, regular , right-sided Port-A-Cath in situ Abdomen - soft, nontender, nondistended, no masses or organomegaly Neurological - screening mental status exam normal, neck supple without rigidity, cranial nerves II through XII intact, DTR's normal and symmetric Extremities - no pedal edema noted, intact peripheral pulses  Skin - warm, dry   Data Review   CBC w Diff:  Lab Results  Component Value Date   WBC 8.7 07/26/2021   HGB 8.1 (L) 07/26/2021   HGB 8.7 (L) 07/06/2021   HGB 13.1 08/12/2014   HCT 25.4 (L) 07/26/2021   HCT 40.4 08/12/2014   PLT 341 07/26/2021   PLT 354 07/06/2021   PLT 228 08/12/2014   LYMPHOPCT 14 07/06/2021   LYMPHOPCT 35.0 08/12/2014   MONOPCT 8 07/06/2021   MONOPCT 8.4 08/12/2014   EOSPCT 7 07/06/2021   EOSPCT 4.3 08/12/2014   BASOPCT 1 07/06/2021   BASOPCT 0.8 08/12/2014    CMP:  Lab Results  Component Value Date   NA 136 07/26/2021   NA 142 08/12/2014   K 3.8 07/26/2021   K 4.6 08/12/2014   CL 108 07/26/2021   CL 100 12/20/2012   CO2 23 07/26/2021   CO2 26 08/12/2014   BUN 13 07/26/2021   BUN 10.1 08/12/2014   CREATININE 0.63 07/26/2021   CREATININE 0.89 07/06/2021   CREATININE 0.88 03/11/2019   CREATININE 0.9 08/12/2014   PROT 7.1 07/06/2021   PROT 7.1 08/12/2014   ALBUMIN 3.4 (L) 07/06/2021   ALBUMIN 3.8 08/12/2014   BILITOT 0.2 (L) 07/06/2021    BILITOT 0.25 08/12/2014   ALKPHOS 94 07/06/2021   ALKPHOS 68 08/12/2014   AST 12 (L) 07/06/2021   AST 15 08/12/2014   ALT 9 07/06/2021   ALT 11 08/12/2014  .   Total Discharge time is about 33 minutes  Roxan Hockey M.D on 07/26/2021 at 1:33 PM  Go to [email or web address] -  for contact info  [facility] - Office  [phone]

## 2021-07-26 NOTE — Progress Notes (Signed)
Patient discharged home , daughter and husband picked her up at main entrance, given discharge instructions, verbalized understanding , and is aware of follow up appointments, IV removed from [facility] ,

## 2021-07-27 ENCOUNTER — Other Ambulatory Visit: Payer: Self-pay | Admitting: *Deleted

## 2021-07-27 ENCOUNTER — Ambulatory Visit: Payer: Medicare Other

## 2021-07-27 ENCOUNTER — Encounter: Payer: 59 | Admitting: Dietician

## 2021-07-27 ENCOUNTER — Other Ambulatory Visit: Payer: Self-pay | Admitting: Hematology and Oncology

## 2021-07-27 ENCOUNTER — Other Ambulatory Visit: Payer: Medicare Other

## 2021-07-27 ENCOUNTER — Ambulatory Visit: Payer: Medicare Other | Admitting: Hematology and Oncology

## 2021-07-27 ENCOUNTER — Other Ambulatory Visit (HOSPITAL_COMMUNITY): Payer: 59

## 2021-07-27 MED ORDER — HYDROCOD POLST-CPM POLST ER 10-8 MG/5ML PO SUER
5.0000 mL | Freq: Every evening | ORAL | 0 refills | Status: DC | PRN
Start: 2021-07-27 — End: 2021-08-16

## 2021-07-27 NOTE — Progress Notes (Signed)
Received call from pt requesting refill on Tussionex.  Pt states hospital provider discharged her home with Promethazine-Codeine.  Pt states in the past the Promethazine caused her to experience insomnia and worsening cough.  MD notified, Promethazine discontinues and prescription sent in for Tussionex.

## 2021-07-29 ENCOUNTER — Telehealth: Payer: Self-pay

## 2021-07-29 NOTE — Telephone Encounter (Signed)
Sure, I will see her tomorrow. Thanks.

## 2021-07-29 NOTE — Telephone Encounter (Signed)
Patient states she was discharged with cefdinir and doxycycline. Patient states she was in the hospital for 1 night. Patient complains headaches, nausea and vomiting since starting the antibiotics. Patient states she has stopped taking them as of right now and feels she needs to be prescribed something else. I have advised her that Dr. Tommy Medal is not in the office this week. Patient will come in to be evaluated by Dr. West Bali tomorrow. I have advised the patient to get her a little earlier before her appointment time, since Dr. West Bali does not have any 30 min slots available.  Earlean Fidalgo T Brooks Sailors

## 2021-07-29 NOTE — Telephone Encounter (Signed)
Patient stated she is having side effects from the antibiotics she was discharged from [facility] and is no longer taking them.  She would like to know if there is something else that can be prescribed for her.

## 2021-07-30 ENCOUNTER — Other Ambulatory Visit: Payer: Self-pay

## 2021-07-30 ENCOUNTER — Encounter: Payer: Self-pay | Admitting: Infectious Diseases

## 2021-07-30 ENCOUNTER — Ambulatory Visit (INDEPENDENT_AMBULATORY_CARE_PROVIDER_SITE_OTHER): Payer: 59 | Admitting: Infectious Diseases

## 2021-07-30 ENCOUNTER — Encounter: Payer: Self-pay | Admitting: Urology

## 2021-07-30 VITALS — BP 102/67 | HR 99 | Wt 89.0 lb

## 2021-07-30 DIAGNOSIS — Z8619 Personal history of other infectious and parasitic diseases: Secondary | ICD-10-CM | POA: Diagnosis not present

## 2021-07-30 DIAGNOSIS — J90 Pleural effusion, not elsewhere classified: Secondary | ICD-10-CM | POA: Diagnosis not present

## 2021-07-30 DIAGNOSIS — F17200 Nicotine dependence, unspecified, uncomplicated: Secondary | ICD-10-CM | POA: Diagnosis not present

## 2021-07-30 DIAGNOSIS — B441 Other pulmonary aspergillosis: Secondary | ICD-10-CM

## 2021-07-30 DIAGNOSIS — Z95828 Presence of other vascular implants and grafts: Secondary | ICD-10-CM

## 2021-07-30 DIAGNOSIS — R899 Unspecified abnormal finding in specimens from other organs, systems and tissues: Secondary | ICD-10-CM

## 2021-07-30 MED ORDER — AMOXICILLIN-POT CLAVULANATE 875-125 MG PO TABS: 1.0000 | ORAL_TABLET | Freq: Two times a day (BID) | ORAL | 0 refills | Status: DC

## 2021-07-30 NOTE — Progress Notes (Signed)
Spoke w/ patient, verified identity, and begin nursing interview. Patient states "Recovering from pneumonia, and is experiencing some mild chest pain 3/10, w/ cough and fatigue." No other symptoms reported at this time.  Meaningful use complete.  Patient notified of 9:00am-08/06/21 telephone appointment w/ Ashlyn Bruning PA-C. I left my extension [phone] in case patient needs to call. Patient verbalized understanding of information given.  Patient preferred contact [phone]

## 2021-07-30 NOTE — Progress Notes (Addendum)
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[place], Brain Mets 2019 06/10/2019   Mediastinal adenopathy    H/O coccidioidomycosis and Aspergillosus 05/16/2018   DOE (dyspnea on exertion) 05/03/2018   Diarrhea 04/22/2018   Cavitary lesion of lung in area of previous cyts in apex of Sup Segment of LLL 03/29/2018   Aortic atherosclerosis ([place]) 01/22/2018   Emphysema lung ([place]) 01/22/2018   CAP (community acquired pneumonia) 11/23/2017   Metastasis to brain ([place]) 08/04/2017   Brain metastasis (Lt Breast Ca Primary) 07/27/2017   Nicotine abuse 07/27/2017   Migraines 07/27/2017   HCAP (healthcare-associated pneumonia) 11/26/2016   Upper airway cough syndrome 08/10/2016   Abnormal echocardiogram 08/07/2013   Chest tightness or pressure 08/07/2013   Hx of radiation therapy    Edema of left lower extremity 11/19/2012   Tachycardia 09/20/2012   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 ---s/p Lumpectomy/ /Initial Ca 1997, Brain Mets 2019 06/08/2012   Obstructive bronchiectasis ([place]) 01/26/2011   Cigarette smoker 01/26/2011    Patient's Medications  New Prescriptions   AMOXICILLIN-CLAVULANATE (AUGMENTIN) 875-125 MG TABLET    Take 1 tablet by mouth 2 (two) times daily.  Previous Medications   ACETAMINOPHEN (TYLENOL) 325 MG TABLET    Take 2 tablets (650 mg total) by mouth every 6 (six) hours as needed for mild pain (or Fever >/= 101).   ALBUTEROL (VENTOLIN HFA) 108 (90 BASE) MCG/ACT INHALER    Inhale 2 puffs into the lungs every 6 (six) hours as needed for wheezing or shortness of breath (cough).   BUDESONIDE-FORMOTEROL (SYMBICORT) 160-4.5 MCG/ACT  INHALER    Inhale 2 puffs into the lungs 2 (two) times daily.   CHLORPHENIRAMINE-HYDROCODONE (TUSSIONEX) 10-8 MG/5ML SUER    Take 5 mLs by mouth at bedtime as needed for cough.   DIPHENOXYLATE-ATROPINE (LOMOTIL) 2.5-0.025 MG TABLET    Take 1 tablet by mouth 4 (four) times daily as needed for diarrhea or loose stools.   LIDOCAINE-PRILOCAINE (EMLA) CREAM    APPLY EXTERNALLY TO THE AFFECTED AREA 1 TIME   MULTIPLE VITAMINS-MINERALS (EMERGEN-C VITAMIN C PO)    Take by mouth.   NERATINIB MALEATE (NERLYNX) 40 MG TABLET    Take 3 tablets (120 mg total) by mouth daily. Take with food.   VITAMIN E 200 UNITS TABS    Take by mouth.   VORICONAZOLE (VFEND) 200 MG TABLET    Take 1 tablet (200 mg total) by mouth 2 (two) times daily.  Modified Medications   No medications on file  Discontinued Medications   CEFDINIR (OMNICEF) 300 MG CAPSULE    Take 1 capsule (300 mg total) by mouth 2 (two) times daily for 5 days.   DOXYCYCLINE (VIBRA-TABS) 100 MG TABLET    Take 1 tablet (100 mg total) by mouth 2 (two) times daily for 5 days.    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 Patient has been followed by my partner Dr Tommy Medal for a long time. This is the first time I have seen her. I have reviewed prior medical records as well as  her recent hospitalization records.   Admitted 1/15-1/6 - presented with left sided pleuritic chest pain. WBC 15.2 CT  chest with concerns for new PNA. Started on Ceftriaxone and Azithromycin, bronchodilators, mucolytics. Patient was later discharged on doxycycline and Omnicef after case was discussed with Dr Tommy Medal.   Today - She tells me she was not able to pick up her abtx until Tuesday evening and started taking both of them together and that made her sick nausea, vomiting, dizziness and headache. She took the antibiotics until Wednesday evening and then stopped it because of the symptoms. She smokes at least 10 cigarettes a day and has hard time quitting. SOB seems to be chronic in the setting of COPD, chronic cavitary aspergillosis and smoking. She however tells me she continues to have pleuritic chest pain in the left side which was the primary reason she went to the hospital. Cough is also chronic. Denies fevers and chills. CTA showed increased opacity at the left lung base associated with minimal pleural effusion ( new), lung base opacity has increased from prior exam and suspicious for PNA. Multiple left lung serpiginous cystic lesions, several with dependent fluid ( without change from the prior chest CT and suspected to be the sequelae of chronic atypical infection,particularly fungal)   Review of Systems: ROS Negative for fevers, chills Chronic SOB All other systems reviewed and negative except as above   Past Medical History:  Diagnosis Date   Anemia    Arthritis    knees and hips   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[place]) 02/26/2020   Asthma    Breast cancer ([place])    Bronchiectasis ([place])    Bronchiolitis    Cancer ([place])    breast cancer 2014   Cancer, metastatic to liver ([place])    7616   Complication of anesthesia    bp dropped + desat    COPD (chronic obstructive pulmonary disease) ([place])    Dyspnea    DOE   GERD (gastroesophageal reflux disease)    H/O coccidioidomycosis    was reason for lung lobectomy   Headache(784.0)    due to eye strain or not eating   History of anemia    no current problem    History of asthma    as a child   History of breast cancer 2014   left   History of chemotherapy    finished 07/17/2013   History of hiatal hernia    AGE 17   Hx of radiation therapy 03/25/13-05/06/13   left breast 5000 cGy/25 sessions, left breast boost 1000 cGy/5 sessions   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   Pneumonia    LAST FLARE UP 01/2018   Rash 02/22/2021   Runny nose 07/30/2013   clear drainage   Smoker 05/24/2021   Wears dentures    upper   Past Surgical History:  Procedure Laterality Date   APPLICATION OF CRANIAL NAVIGATION N/A 08/04/2017   Procedure: APPLICATION OF CRANIAL NAVIGATION;  Surgeon: [name], [name], MD;  Location: [place];  Service:  Neurosurgery;  Laterality: N/A;   AXILLARY LYMPH NODE DISSECTION Left 02/08/2013   Procedure: LEFT AXILLARY DISSECTION;  Surgeon: Edward Jolly, MD;  Location: [place];  Service: General;  Laterality: Left;   BREAST CYST EXCISION Right 2006   BREAST LUMPECTOMY Left 2014   BREAST LUMPECTOMY WITH NEEDLE LOCALIZATION AND AXILLARY SENTINEL LYMPH NODE BX Left 12/31/2012   Procedure: NEEDLE LOCALIZATION LEFT BREAST LUMPECTOMY AND LEFT AXILLARY SENTENIAL LYMPH NODE BX;  Surgeon: Edward Jolly, MD;  Location: [place];  Service: General;  Laterality: Left;   BRONCHIAL BRUSHINGS  03/17/2020   Procedure: BRONCHIAL BRUSHINGS;  Surgeon: Garner Nash, DO;  Location: [place] [facility];  Service: Pulmonary;;   BRONCHIAL WASHINGS  03/17/2020   Procedure: BRONCHIAL WASHINGS;  Surgeon: Garner Nash, DO;  Location: [place];  Service: Pulmonary;;   CESAREAN SECTION  1995/1996   CRANIOTOMY Right 08/04/2017   Procedure: Right Frontal craniotomy for resection of tumor with stereotactic navigation;  Surgeon: [name], [name], MD;  Location: [place];  Service: Neurosurgery;  Laterality: Right;  Right Frontal craniotomy for resection of tumor with stereotactic navigation   IR CV LINE INJECTION  02/19/2021   IR IMAGING  GUIDED PORT INSERTION  05/31/2019   LUNG LOBECTOMY Left 05/1996   upper lobe - due to [facility] Fever   PORT-A-CATH REMOVAL Right 08/02/2013   Procedure: REMOVAL PORT-A-CATH;  Surgeon: Edward Jolly, MD;  Location: [place];  Service: General;  Laterality: Right;   PORTACATH PLACEMENT  07/02/2012   Procedure: INSERTION PORT-A-CATH;  Surgeon: Edward Jolly, MD;  Location: [place];  Service: General;  Laterality: N/A;  right   VIDEO BRONCHOSCOPY N/A 03/17/2020   Procedure: VIDEO BRONCHOSCOPY;  Surgeon: Garner Nash, DO;  Location: [place];  Service: Pulmonary;  Laterality: N/A;   VIDEO BRONCHOSCOPY WITH ENDOBRONCHIAL ULTRASOUND N/A 05/23/2018   Procedure: VIDEO BRONCHOSCOPY WITH ENDOBRONCHIAL ULTRASOUND;  Surgeon: Garner Nash, DO;  Location: [facility];  Service: Thoracic;  Laterality: N/A;    Social History   Tobacco Use   Smoking status: Every Day    Packs/day: 0.30    Years: 38.00    Pack years: 11.40    Types: Cigarettes   Smokeless tobacco: Never   Tobacco comments:    10 cigarettes smoked daily 10/19/20 [facility]   Vaping Use   Vaping Use: Never used  Substance Use Topics   Alcohol use: No   Drug use: No    Family History  Problem Relation Age of Onset   Emphysema Mother        was a smoker   Heart disease Mother    Melanoma Mother        dx in her 47s   Breast cancer Mother 13   Asthma Brother    Breast cancer Cousin        mother's maternal cousin; dx in her 63s    Allergies  Allergen Reactions   Aspirin Anaphylaxis and Shortness Of Breath    THROAT CLOSES   Protonix [Pantoprazole] Nausea Only and Other (See Comments)    Also caused a "film in the mouth" and caused chest pressure   Promethazine-Codeine Cough    Worsening cough and insomnia    Iodinated Contrast Media Rash    Patient allergic to contrast used in radiation oncology for CT simulation     Health Maintenance  Topic Date Due   COVID-19 Vaccine (1)  Never done   Hepatitis  C Screening  Never done   TETANUS/TDAP  Never done   Zoster Vaccines- Shingrix (1 of 2) Never done   COLONOSCOPY (Pts 45-41yr Insurance coverage will need to be confirmed)  Never done   PAP SMEAR-Modifier  11/20/2016   MAMMOGRAM  02/23/2023   INFLUENZA VACCINE  Completed   HIV Screening  Completed   HPV VACCINES  Aged Out    Objective:  Vitals:   07/30/21 1120  BP: 102/67  Pulse: 99  Weight: 89 lb (40.4 kg)   Body mass index is 16.28 kg/m.  Physical Exam Constitutional:      Appearance: thin appearing HENT:     Head: Normocephalic and atraumatic.      Mouth: Mucous membranes are moist.  Eyes:    Conjunctiva/sclera: Conjunctivae normal.     Pupils: Pupils are equal, round.   Cardiovascular:     Rate and Rhythm: Normal rate and regular rhythm.     Heart sounds:   Pulmonary:     Effort: Pulmonary effort is normal.     Breath sounds: Mild rhonchi bilaterally   Abdominal:     General: Abdomen is flat.     Palpations: Abdomen is soft.   Musculoskeletal:        General: Normal range of motion.   Skin:    General: Skin is warm and dry.     Comments: Portacath is OK   Neurological:     General: ambulatory     Mental Status: She is awake, alert and oriented to person, place, and time.   Psychiatric:        Mood and Affect: Mood normal.   Lab Results Lab Results  Component Value Date   WBC 8.7 07/26/2021   HGB 8.1 (L) 07/26/2021   HCT 25.4 (L) 07/26/2021   MCV 86.7 07/26/2021   PLT 341 07/26/2021    Lab Results  Component Value Date   CREATININE 0.63 07/26/2021   BUN 13 07/26/2021   NA 136 07/26/2021   K 3.8 07/26/2021   CL 108 07/26/2021   CO2 23 07/26/2021    Lab Results  Component Value Date   ALT 9 07/06/2021   AST 12 (L) 07/06/2021   ALKPHOS 94 07/06/2021   BILITOT 0.2 (L) 07/06/2021    No results found for: CHOL, HDL, LDLCALC, LDLDIRECT, TRIG, CHOLHDL No results found for: LABRPR, RPRTITER No results found for:  HIV1RNAQUANT, HIV1RNAVL, CD4TABS  Problem List Items Addressed This Visit       Respiratory   Chronic pulmonary aspergillosis (HCC)   Pleural effusion     Other   H/O coccidioidomycosis and Aspergillosus   Smoker - Primary   Acid-fast bacteria present   Assessment/Plan  89Y O Female with a very complicated history with h/o coccidiodomycosis s/p left upper lung lobectomy on chronic fluconazole in the past, Breast ca ( 2013) s/p left breast lumpectomy, radiation with recurrence 2019 with mets to the brain 2019  s/p Rt frontal brain resection and sterotactic radiation s/p chemotherapy and radiation, following Oncology, 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  Has taken augmentin without issues previously. -Continue Voriconazole for known aspergillosis - Fu with Oncology as instructed - Counseled on smoking cessation - Fu in 1-2 weeks with Dr Tommy Medal   I have personally spent 58 minutes involved in face-to-face and non-face-to-face activities for this patient on the day of the visit. Professional time spent includes the following activities: Preparing to see the patient (review of tests), Obtaining and/or reviewing separately obtained history (admission/discharge record), Performing a medically appropriate examination and/or evaluation , Ordering medications/tests/procedures, referring and communicating with other health care professionals, Documenting clinical information in the EMR, Independently interpreting results (not separately  reported), Communicating results to the patient/family/caregiver, Counseling and educating the patient/family/caregiver and Care coordination (not separately reported).   Wilber Oliphant, [place] [facility] 07/30/2021, 11:38 AM

## 2021-08-01 DIAGNOSIS — B441 Other pulmonary aspergillosis: Secondary | ICD-10-CM | POA: Insufficient documentation

## 2021-08-01 DIAGNOSIS — R899 Unspecified abnormal finding in specimens from other organs, systems and tissues: Secondary | ICD-10-CM | POA: Insufficient documentation

## 2021-08-01 DIAGNOSIS — J90 Pleural effusion, not elsewhere classified: Secondary | ICD-10-CM | POA: Insufficient documentation

## 2021-08-02 NOTE — Progress Notes (Signed)
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 ---s/p Lumpectomy/ /Initial Ca 1997, Brain Mets 2019 Staging form: Breast, AJCC 7th Edition - Clinical: Stage IIA (T2, N0, cM0) - Unsigned Histopathologic type: 9931 Laterality: Left Tumor size (mm): 13 Staging comments: Staged at breast conference 12.4.13  - Pathologic: Stage IIA (T1c, N1, cM0) - Unsigned Histopathologic type: 9931 Laterality: Left Tumor size (mm): 13   SUMMARY OF ONCOLOGIC HISTORY: Oncology History  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 Ki-67 was 70% HER-2 was amplified with a ratio 2.91   07/12/2012 - 07/17/2013 Neo-Adjuvant Chemotherapy   TCH 6 followed by Herceptin maintenance   12/11/2012 Surgery   Left breast lumpectomy: 1.8 cm tumor 1 positive sentinel node, axillary lymph node dissection 02/08/2013 showed 0/13 lymph nodes   03/25/2013 - 05/06/2013 Radiation Therapy   Adjuvant radiation therapy   06/05/2013 - 07/20/2017 Anti-estrogen oral therapy   Tamoxifen 20 mg daily   07/27/2017 Relapse/Recurrence   MRI Brain: 3.4 x 2.9 x 2.9 cm RIGHT frontal lobe mass with imaging characteristics of solitary metastasis. Extensive vasogenic edema resulting in 9 mm RIGHT to LEFT midline shift. Equivocal very early LEFT ventricle entrapment.    08/04/2017 Surgery   Rt frontal brain resection: Poorly differentiated tumor IHC suggests breast primary ER and PR Positive   08/25/2017 - 09/04/2017 Radiation Therapy   Stereotactic radiation   09/18/2017 -  Anti-estrogen oral therapy   Lapatinib with letrozole   12/18/2017 - 12/19/2017 Radiation  Therapy   New right parietal lobe metastases status post [facility]   08/27/2018 - 08/27/2020 Radiation Therapy   SRS to new brain metastases   05/09/2019 Relapse/Recurrence   Interval increase in size of the enhancing nodular left internal mammary soft tissue 2.8 cm.  Redemonstrated enlarged supraclavicular, lower cervical and lower posterior cervical nodes unchanged.  Interval increase in the bony erosion of the posterior and lateral left third rib, increasing soft tissue lesion eroding the left sternal body 3.1 cm was 2.5 cm.  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  INTERVAL HISTORY: Melissa [name] 56 y.o. female returns for evaluation of her metastatic breast cancer.  She was recent only admitted to the hospital for pleuritic chest pain and pneumonia.  CTA showed no pulmonary embolus however it did show pneumonia superimposed on chronic aspergillosis and coccidiomycosis.  She was treated with IV antibiotics and was sent out with voriconazole and additional oral antibiotics of Omnicef and doxycycline.  Melissa [name] is a current every day smoker and does.  Bronchodilators to use for her lungs.  She is casual for repeat echocardiogram on August 05, 2021.  Her most recent echocardiogram on January 27, 2021 showed a normal ejection  fraction of 60 to 65%.  She was receiving Neratinib and Herceptin, and continued to experience weight loss and diarrhea despite multiple dose reductions in Neratinib.  She underwent restaging scans which showed one indeterminate liver lesion, and chornic  lung infection, but was otherwise stable.  She denies any new concerns other than continued weight loss which worries her.  She does have history of previously difficult to see hepatic metastases.     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[place], Brain Mets 2019 06/10/2019   Mediastinal adenopathy    H/O coccidioidomycosis and Aspergillosus 05/16/2018   DOE (dyspnea on exertion) 05/03/2018   Diarrhea 04/22/2018   Cavitary lesion of lung in area of previous cyts in apex of Sup Segment of LLL 03/29/2018   Aortic atherosclerosis ([place]) 01/22/2018   Emphysema lung ([place]) 01/22/2018   CAP (community acquired pneumonia) 11/23/2017   Metastasis to brain ([place]) 08/04/2017   Brain metastasis (Lt Breast Ca Primary) 07/27/2017   Nicotine abuse 07/27/2017   Migraines 07/27/2017   HCAP (healthcare-associated pneumonia) 11/26/2016   Upper airway cough syndrome 08/10/2016   Abnormal echocardiogram 08/07/2013   Chest tightness or pressure 08/07/2013   Hx of radiation therapy    Edema of left lower extremity 11/19/2012   Tachycardia 09/20/2012   Obstructive bronchiectasis ([place]) 01/26/2011   Cigarette smoker 01/26/2011    is allergic to aspirin, protonix [pantoprazole], doxycycline, promethazine-codeine, and iodinated contrast media.  MEDICAL HISTORY: Past Medical History:  Diagnosis Date   Anemia    Arthritis     knees and hips   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[place]) 02/26/2020   Asthma    Breast cancer ([place])    Bronchiectasis ([place])    Bronchiolitis    Cancer ([place])    breast cancer 2014   Cancer, metastatic to liver ([place])    4765   Complication of anesthesia    bp dropped + desat    COPD (chronic obstructive pulmonary disease) (HCC)    Dyspnea    DOE   GERD (gastroesophageal reflux disease)    H/O coccidioidomycosis    was reason for lung lobectomy   Headache(784.0)    due to eye strain or not eating   History of anemia    no current problem   History of asthma    as a child   History of breast cancer 2014   left   History of chemotherapy    finished 07/17/2013   History of hiatal hernia    AGE 17   Hx of radiation therapy 03/25/13-05/06/13   left breast 5000 cGy/25 sessions, left breast boost 1000 cGy/5 sessions   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   Pneumonia    LAST FLARE UP 01/2018   Rash 02/22/2021   Runny nose 07/30/2013   clear drainage   Smoker 05/24/2021   Wears dentures    upper    SURGICAL HISTORY: Past Surgical History:  Procedure Laterality Date   APPLICATION OF CRANIAL  NAVIGATION N/A 08/04/2017   Procedure: APPLICATION OF CRANIAL NAVIGATION;  Surgeon: [name], [name], MD;  Location: [place];  Service: Neurosurgery;  Laterality: N/A;   AXILLARY LYMPH NODE DISSECTION Left 02/08/2013   Procedure: LEFT AXILLARY DISSECTION;  Surgeon: Edward Jolly, MD;  Location: [place];  Service: General;  Laterality: Left;   BREAST CYST EXCISION Right 2006   BREAST LUMPECTOMY Left 2014   BREAST LUMPECTOMY WITH NEEDLE LOCALIZATION AND AXILLARY SENTINEL LYMPH NODE BX Left 12/31/2012   Procedure: NEEDLE LOCALIZATION LEFT BREAST LUMPECTOMY AND LEFT AXILLARY SENTENIAL LYMPH NODE BX;  Surgeon: Edward Jolly, MD;  Location: [place];  Service: General;  Laterality: Left;   BRONCHIAL BRUSHINGS  03/17/2020   Procedure: BRONCHIAL BRUSHINGS;  Surgeon: Garner Nash, DO;   Location: [facility];  Service: Pulmonary;;   BRONCHIAL WASHINGS  03/17/2020   Procedure: BRONCHIAL WASHINGS;  Surgeon: Garner Nash, DO;  Location: [place];  Service: Pulmonary;;   CESAREAN SECTION  1995/1996   CRANIOTOMY Right 08/04/2017   Procedure: Right Frontal craniotomy for resection of tumor with stereotactic navigation;  Surgeon: [name], [name], MD;  Location: [place];  Service: Neurosurgery;  Laterality: Right;  Right Frontal craniotomy for resection of tumor with stereotactic navigation   IR CV LINE INJECTION  02/19/2021   IR IMAGING GUIDED PORT INSERTION  05/31/2019   LUNG LOBECTOMY Left 05/1996   upper lobe - due to [facility] Fever   PORT-A-CATH REMOVAL Right 08/02/2013   Procedure: REMOVAL PORT-A-CATH;  Surgeon: Edward Jolly, MD;  Location: [place];  Service: General;  Laterality: Right;   PORTACATH PLACEMENT  07/02/2012   Procedure: INSERTION PORT-A-CATH;  Surgeon: Edward Jolly, MD;  Location: [place];  Service: General;  Laterality: N/A;  right   VIDEO BRONCHOSCOPY N/A 03/17/2020   Procedure: VIDEO BRONCHOSCOPY;  Surgeon: Garner Nash, DO;  Location: [place];  Service: Pulmonary;  Laterality: N/A;   VIDEO BRONCHOSCOPY WITH ENDOBRONCHIAL ULTRASOUND N/A 05/23/2018   Procedure: VIDEO BRONCHOSCOPY WITH ENDOBRONCHIAL ULTRASOUND;  Surgeon: Garner Nash, DO;  Location: [facility];  Service: Thoracic;  Laterality: N/A;    SOCIAL HISTORY: Social History   Socioeconomic History   Marital status: Married    Spouse name: Not on file   Number of children: 2   Years of education: Not on file   Highest education level: Not on file  Occupational History   Occupation: Secondary school teacher  Tobacco Use   Smoking status: Every Day    Packs/day: 0.30    Years: 38.00    Pack years: 11.40    Types: Cigarettes   Smokeless tobacco: Never   Tobacco comments:    10 cigarettes smoked daily 10/19/20 ARJ   Vaping Use   Vaping Use:  Never used  Substance and Sexual Activity   Alcohol use: No   Drug use: No   Sexual activity: Not Currently  Other Topics Concern   Not on file  Social History Narrative   Not on file   Social Determinants of Health   Financial Resource Strain: Not on file  Food Insecurity: Not on file  Transportation Needs: Not on file  Physical Activity: Not on file  Stress: Not on file  Social Connections: Not on file  Intimate Partner Violence: Not on file    FAMILY HISTORY: Family History  Problem Relation Age of Onset   Emphysema Mother        was a smoker  Heart disease Mother    Melanoma Mother        dx in her 107s   Breast cancer Mother 9   Asthma Brother    Breast cancer Cousin        mother's maternal cousin; dx in her 58s    Review of Systems  Constitutional:  Positive for appetite change, fatigue and unexpected weight change. Negative for chills and fever.  HENT:   Negative for hearing loss, lump/mass and trouble swallowing.   Eyes:  Negative for eye problems and icterus.  Respiratory:  Negative for chest tightness, cough and shortness of breath.   Cardiovascular:  Negative for chest pain, leg swelling and palpitations.  Gastrointestinal:  Negative for abdominal distention, abdominal pain, constipation, diarrhea, nausea and vomiting.  Endocrine: Negative for hot flashes.  Genitourinary:  Negative for difficulty urinating.   Musculoskeletal:  Negative for arthralgias.  Skin:  Negative for itching and rash.  Neurological:  Negative for dizziness, extremity weakness, headaches and numbness.  Hematological:  Negative for adenopathy. Does not bruise/bleed easily.  Psychiatric/Behavioral:  Negative for depression. The patient is not nervous/anxious.      PHYSICAL EXAMINATION  ECOG PERFORMANCE STATUS: 2 - Symptomatic, <50% confined to bed  Vitals:   08/03/21 1335  BP: (!) 104/59  Pulse: 96  Resp: 18  Temp: 98.1 F (36.7 C)  SpO2: 99%    Physical  Exam Constitutional:      General: She is not in acute distress.    Appearance: Normal appearance. She is not toxic-appearing.  HENT:     Head: Normocephalic and atraumatic.  Eyes:     General: No scleral icterus. Cardiovascular:     Rate and Rhythm: Normal rate and regular rhythm.     Pulses: Normal pulses.     Heart sounds: Normal heart sounds.  Pulmonary:     Effort: Pulmonary effort is normal.     Breath sounds: Normal breath sounds.  Abdominal:     General: Abdomen is flat. Bowel sounds are normal. There is no distension.     Palpations: Abdomen is soft.     Tenderness: There is no abdominal tenderness.  Musculoskeletal:        General: No swelling.     Cervical back: Neck supple.  Lymphadenopathy:     Cervical: No cervical adenopathy.  Skin:    General: Skin is warm and dry.     Findings: No rash.  Neurological:     General: No focal deficit present.     Mental Status: She is alert.  Psychiatric:        Mood and Affect: Mood normal.        Behavior: Behavior normal.    LABORATORY DATA:  CBC    Component Value Date/Time   WBC 16.2 (H) 08/03/2021 1317   WBC 8.7 07/26/2021 0411   RBC 3.53 (L) 08/03/2021 1317   HGB 9.6 (L) 08/03/2021 1317   HGB 13.1 08/12/2014 0955   HCT 29.5 (L) 08/03/2021 1317   HCT 40.4 08/12/2014 0955   PLT 441 (H) 08/03/2021 1317   PLT 228 08/12/2014 0955   MCV 83.6 08/03/2021 1317   MCV 98.2 08/12/2014 0955   MCH 27.2 08/03/2021 1317   MCHC 32.5 08/03/2021 1317   RDW 18.6 (H) 08/03/2021 1317   RDW 13.0 08/12/2014 0955   LYMPHSABS 2.0 08/03/2021 1317   LYMPHSABS 2.0 08/12/2014 0955   MONOABS 0.9 08/03/2021 1317   MONOABS 0.5 08/12/2014 0955   EOSABS 1.2 (H) 08/03/2021  1317   EOSABS 0.2 08/12/2014 0955   BASOSABS 0.1 08/03/2021 1317   BASOSABS 0.0 08/12/2014 0955    CMP     Component Value Date/Time   NA 137 08/03/2021 1317   NA 142 08/12/2014 0956   K 4.3 08/03/2021 1317   K 4.6 08/12/2014 0956   CL 104 08/03/2021 1317    CL 100 12/20/2012 1509   CO2 27 08/03/2021 1317   CO2 26 08/12/2014 0956   GLUCOSE 116 (H) 08/03/2021 1317   GLUCOSE 94 08/12/2014 0956   GLUCOSE 98 12/20/2012 1509   BUN 16 08/03/2021 1317   BUN 10.1 08/12/2014 0956   CREATININE 0.83 08/03/2021 1317   CREATININE 0.88 03/11/2019 1057   CREATININE 0.9 08/12/2014 0956   CALCIUM 9.3 08/03/2021 1317   CALCIUM 9.3 08/12/2014 0956   PROT 7.4 08/03/2021 1317   PROT 7.1 08/12/2014 0956   ALBUMIN 3.7 08/03/2021 1317   ALBUMIN 3.8 08/12/2014 0956   AST 15 08/03/2021 1317   AST 15 08/12/2014 0956   ALT 12 08/03/2021 1317   ALT 11 08/12/2014 0956   ALKPHOS 89 08/03/2021 1317   ALKPHOS 68 08/12/2014 0956   BILITOT 0.2 (L) 08/03/2021 1317   BILITOT 0.25 08/12/2014 0956   GFRNONAA >60 08/03/2021 1317   GFRNONAA 76 03/11/2019 1057   GFRAA >60 03/24/2020 1012   GFRAA >60 02/11/2020 0836   GFRAA 88 03/11/2019 1057       ASSESSMENT and PLAN:   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  [place] brain: 08/25/2017-09/04/2017 2. Anti Her 2 therapy with Lapatinib started 09/17/2017-05/14/2019: Stopped for progression 3.  I discontinued tamoxifen and started her on letrozole 2.5 mg daily.  05/14/2019 stopped for progression 4.  Stereotactic radiosurgery 12/19/2017 to the new right parietal lobe metastases. 5.  Kadcyla: Received 16 cycles discontinued 05/05/2020 6.  Tucatinib Xeloda: Discontinued because of hand-foot syndrome  06/11/2020-12/15/2020 -------------------------------------------------------------------------------------------------------------------- Liver Biopsy 06/05/19: Metastatic cancer, ER/PR: 0%, Her 2: 3+ Positive, Ki 67: 20% Patient had metastases to liver, bone, brain, and questionably lung   Bone metastases: Because of dental issues bisphosphonates were not started   New brain metastases: Brain MRI 05/01/2020: Bilateral occipital, left temporal and cerebellar mets: Status post SBRT Lung aspergillus infection: Following with pulmonary and infectious disease.  AFB positive, being treated with voriconazole MRI of the brain 08/13/20: Mixed treatment response with Dec in 3 lesions and multiple new lesions: Status post SBRT Herceptin/Neratinib: unable to tolerate due to diarrhea and weight loss ------------------------------------------------------------------------------------------------------------- Current treatment: Herceptin neratinib    She could not tolerate the Neratinib and it was discontinued.  She and I reviewed her recent hospitalization and her imaging reports.  I looked back over all of her previous CT scans and after discussion we decided that her most recent CT scan was indeterminant of the new area noted in the liver.  Due to this we will get a PET scan to evaluate the status of her cancer since she has had cancer that has been occult on CT scan  before.  Once she has completed the PET scan she will see Dr. Lindi Adie in clinic and they will discuss treatment options.  She has received several different treatments already however could consider margetuximab.   Return to clinic in 2 weeks for follow-up with Dr. Lindi Adie   All questions were answered. The patient knows to call the clinic with any problems, questions or concerns. We can certainly see the patient much sooner if necessary.  Total encounter time: 30 minutes in face-to-face visit time, chart review, lab review, care coordination, and  documentation of the encounter.  Wilber Bihari, NP 08/04/21 8:24 PM Medical Oncology and Hematology [facility] [place], [place] [address] Tel. [phone]    Fax. [phone]  *Total Encounter Time as defined by the Centers for Medicare and Medicaid Services includes, in addition to the face-to-face time of a patient visit (documented in the note above) non-face-to-face time: obtaining and reviewing outside history, ordering and reviewing medications, tests or procedures, care coordination (communications with other health care professionals or caregivers) and documentation in the medical record.

## 2021-08-03 ENCOUNTER — Inpatient Hospital Stay: Payer: 59 | Attending: Medical

## 2021-08-03 ENCOUNTER — Encounter: Payer: Self-pay | Admitting: Adult Health

## 2021-08-03 ENCOUNTER — Inpatient Hospital Stay: Payer: 59 | Admitting: Nutrition

## 2021-08-03 ENCOUNTER — Other Ambulatory Visit: Payer: Self-pay

## 2021-08-03 ENCOUNTER — Encounter: Payer: Self-pay | Admitting: Hematology and Oncology

## 2021-08-03 ENCOUNTER — Inpatient Hospital Stay: Payer: 59

## 2021-08-03 ENCOUNTER — Inpatient Hospital Stay: Payer: 59 | Admitting: Adult Health

## 2021-08-03 ENCOUNTER — Telehealth: Payer: Self-pay | Admitting: Hematology and Oncology

## 2021-08-03 VITALS — BP 104/59 | HR 96 | Temp 98.1°F | Resp 18 | Ht 62.0 in | Wt 88.6 lb

## 2021-08-03 DIAGNOSIS — Z95828 Presence of other vascular implants and grafts: Secondary | ICD-10-CM

## 2021-08-03 DIAGNOSIS — C7931 Secondary malignant neoplasm of brain: Secondary | ICD-10-CM | POA: Diagnosis not present

## 2021-08-03 DIAGNOSIS — C50212 Malignant neoplasm of upper-inner quadrant of left female breast: Secondary | ICD-10-CM

## 2021-08-03 DIAGNOSIS — Z5112 Encounter for antineoplastic immunotherapy: Secondary | ICD-10-CM | POA: Diagnosis present

## 2021-08-03 DIAGNOSIS — Z170 Estrogen receptor positive status [ER+]: Secondary | ICD-10-CM

## 2021-08-03 DIAGNOSIS — C787 Secondary malignant neoplasm of liver and intrahepatic bile duct: Secondary | ICD-10-CM | POA: Insufficient documentation

## 2021-08-03 DIAGNOSIS — C7951 Secondary malignant neoplasm of bone: Secondary | ICD-10-CM | POA: Insufficient documentation

## 2021-08-03 DIAGNOSIS — K521 Toxic gastroenteritis and colitis: Secondary | ICD-10-CM

## 2021-08-03 LAB — CBC WITH DIFFERENTIAL (CANCER CENTER ONLY)
Abs Immature Granulocytes: 0.05 10*3/uL (ref 0.00–0.07)
Basophils Absolute: 0.1 10*3/uL (ref 0.0–0.1)
Basophils Relative: 1 %
Eosinophils Absolute: 1.2 10*3/uL — ABNORMAL HIGH (ref 0.0–0.5)
Eosinophils Relative: 8 %
HCT: 29.5 % — ABNORMAL LOW (ref 36.0–46.0)
Hemoglobin: 9.6 g/dL — ABNORMAL LOW (ref 12.0–15.0)
Immature Granulocytes: 0 %
Lymphocytes Relative: 12 %
Lymphs Abs: 2 10*3/uL (ref 0.7–4.0)
MCH: 27.2 pg (ref 26.0–34.0)
MCHC: 32.5 g/dL (ref 30.0–36.0)
MCV: 83.6 fL (ref 80.0–100.0)
Monocytes Absolute: 0.9 10*3/uL (ref 0.1–1.0)
Monocytes Relative: 6 %
Neutro Abs: 12 10*3/uL — ABNORMAL HIGH (ref 1.7–7.7)
Neutrophils Relative %: 73 %
Platelet Count: 441 10*3/uL — ABNORMAL HIGH (ref 150–400)
RBC: 3.53 MIL/uL — ABNORMAL LOW (ref 3.87–5.11)
RDW: 18.6 % — ABNORMAL HIGH (ref 11.5–15.5)
WBC Count: 16.2 10*3/uL — ABNORMAL HIGH (ref 4.0–10.5)
nRBC: 0 % (ref 0.0–0.2)

## 2021-08-03 LAB — CMP (CANCER CENTER ONLY)
ALT: 12 U/L (ref 0–44)
AST: 15 U/L (ref 15–41)
Albumin: 3.7 g/dL (ref 3.5–5.0)
Alkaline Phosphatase: 89 U/L (ref 38–126)
Anion gap: 6 (ref 5–15)
BUN: 16 mg/dL (ref 6–20)
CO2: 27 mmol/L (ref 22–32)
Calcium: 9.3 mg/dL (ref 8.9–10.3)
Chloride: 104 mmol/L (ref 98–111)
Creatinine: 0.83 mg/dL (ref 0.44–1.00)
GFR, Estimated: >60 mL/min (ref >60)
Glucose, Bld: 116 mg/dL — ABNORMAL HIGH (ref 70–99)
Potassium: 4.3 mmol/L (ref 3.5–5.1)
Sodium: 137 mmol/L (ref 135–145)
Total Bilirubin: 0.2 mg/dL — ABNORMAL LOW (ref 0.3–1.2)
Total Protein: 7.4 g/dL (ref 6.5–8.1)

## 2021-08-03 LAB — MAGNESIUM: Magnesium: 2 mg/dL (ref 1.7–2.4)

## 2021-08-03 MED ORDER — DIPHENHYDRAMINE HCL 25 MG PO CAPS
25.0000 mg | ORAL_CAPSULE | Freq: Once | ORAL | Status: AC
  Administered 2021-08-03: 15:00:00 25 mg via ORAL
  Filled 2021-08-03: qty 1

## 2021-08-03 MED ORDER — SODIUM CHLORIDE 0.9% FLUSH
10.0000 mL | INTRAVENOUS | Status: DC | PRN
  Administered 2021-08-03: 13:00:00 10 mL

## 2021-08-03 MED ORDER — SODIUM CHLORIDE 0.9% FLUSH
10.0000 mL | INTRAVENOUS | Status: DC | PRN
  Administered 2021-08-03: 16:00:00 10 mL

## 2021-08-03 MED ORDER — HEPARIN SOD (PORK) LOCK FLUSH 100 UNIT/ML IV SOLN
500.0000 [IU] | Freq: Once | INTRAVENOUS | Status: AC | PRN
  Administered 2021-08-03: 16:00:00 500 [IU]

## 2021-08-03 MED ORDER — SODIUM CHLORIDE 0.9 % IV SOLN: Freq: Once | INTRAVENOUS | Status: AC

## 2021-08-03 MED ORDER — TRASTUZUMAB-ANNS CHEMO 150 MG IV SOLR
6.0000 mg/kg | Freq: Once | INTRAVENOUS | Status: AC
  Administered 2021-08-03: 15:00:00 252 mg via INTRAVENOUS
  Filled 2021-08-03: qty 12

## 2021-08-03 MED ORDER — ACETAMINOPHEN 325 MG PO TABS
650.0000 mg | ORAL_TABLET | Freq: Once | ORAL | Status: AC
  Administered 2021-08-03: 15:00:00 650 mg via ORAL
  Filled 2021-08-03: qty 2

## 2021-08-03 NOTE — Telephone Encounter (Signed)
Scheduled appointment per 12/4 los. Patient is aware. 

## 2021-08-03 NOTE — Progress Notes (Signed)
Nutrition follow-up completed with patient during infusion for metastatic breast cancer.  Weight decreased and documented as 88.6 pounds on Jan 24. This is decreased from 93 pounds on Dec 6.  Labs reviewed.  Noted glucose 116.  Patient reports her appetite is very good and she is trying to eat more. She reports weight loss occurred after recent hospitalization and being diagnosed with pneumonia. She is drinking at least [place] 1.4 daily. States she still tries to drink milk. Reports eating 3 meals with 2 snacks a day. She denies nutrition impact symptoms.  Nutrition diagnosis: Inadequate oral intake improving.  Intervention: Encourage patient to continue meals with snacks consisting of high-calorie, high-protein foods.   Increase [employer] 1.4 twice daily between meals. Consider fair life milk with meals. Continue bowel regimen.  Monitoring, evaluation, goals: Patient will work to increase calories and protein to minimize weight loss.  Next visit: To be scheduled as needed.  **Disclaimer: This note was dictated with voice recognition software. Similar sounding words can inadvertently be transcribed and this note may contain transcription errors which may not have been corrected upon publication of note.**

## 2021-08-03 NOTE — Patient Instructions (Signed)
[facility] MEDICAL ONCOLOGY  Discharge Instructions: Thank you for choosing [facility] to provide your oncology and hematology care.   If you have a lab appointment with the Cancer Center, please go directly to the Cancer Center and check in at the registration area.   Wear comfortable clothing and clothing appropriate for easy access to any Portacath or PICC line.   We strive to give you quality time with your provider. You may need to reschedule your appointment if you arrive late (15 or more minutes).  Arriving late affects you and other patients whose appointments are after yours.  Also, if you miss three or more appointments without notifying the office, you may be dismissed from the clinic at the provider's discretion.      For prescription refill requests, have your pharmacy contact our office and allow 72 hours for refills to be completed.    Today you received the following chemotherapy and/or immunotherapy agents Trastuzumab-anns ([place])      To help prevent nausea and vomiting after your treatment, we encourage you to take your nausea medication as directed.  BELOW ARE SYMPTOMS THAT SHOULD BE REPORTED IMMEDIATELY: *FEVER GREATER THAN 100.4 F (38 C) OR HIGHER *CHILLS OR SWEATING *NAUSEA AND VOMITING THAT IS NOT CONTROLLED WITH YOUR NAUSEA MEDICATION *UNUSUAL SHORTNESS OF BREATH *UNUSUAL BRUISING OR BLEEDING *URINARY PROBLEMS (pain or burning when urinating, or frequent urination) *BOWEL PROBLEMS (unusual diarrhea, constipation, pain near the anus) TENDERNESS IN MOUTH AND THROAT WITH OR WITHOUT PRESENCE OF ULCERS (sore throat, sores in mouth, or a toothache) UNUSUAL RASH, SWELLING OR PAIN  UNUSUAL VAGINAL DISCHARGE OR ITCHING   Items with * indicate a potential emergency and should be followed up as soon as possible or go to the Emergency Department if any problems should occur.  Please show the CHEMOTHERAPY ALERT CARD or IMMUNOTHERAPY ALERT  CARD at check-in to the Emergency Department and triage nurse.  Should you have questions after your visit or need to cancel or reschedule your appointment, please contact [facility]COL[facility]  Dept: [phone]  and follow the prompts.  Office hours are 8:00 a.m. to 4:30 p.m. Monday - Friday. Please note that voicemails left after 4:00 p.m. may not be returned until the following business day.  We are closed weekends and major holidays. You have access to a nurse at all times for urgent questions. Please call the main number to the clinic Dept: [phone] and follow the prompts.   For any non-urgent questions, you may also contact your provider using MyChart. We now offer e-Visits for anyone 18 and older to request care online for non-urgent symptoms. For details visit [email or web address].   Also download the MyChart app! Go to the app store, search "MyChart", open the app, select [facility], and log in with your MyChart username and password.  Due to Covid, a mask is required upon entering the hospital/clinic. If you do not have a mask, one will be given to you upon arrival. For doctor visits, patients may have 1 support person aged 18 or older with them. For treatment visits, patients cannot have anyone with them due to current Covid guidelines and our immunocompromised population.   

## 2021-08-04 ENCOUNTER — Encounter: Payer: Self-pay | Admitting: Hematology and Oncology

## 2021-08-04 NOTE — Assessment & Plan Note (Signed)
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 Anti Her 2 therapy with Lapatinibstarted 09/17/2017-05/14/2019: Stopped for progression 3.I discontinuedtamoxifen and started her on letrozole 2.5 mg daily.05/14/2019 stopped for progression 4.Stereotactic radiosurgery 12/19/2017 to the new right parietal lobe metastases. 5.Kadcyla: Received 16 cycles discontinued 05/05/2020 6.  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  She and I reviewed her recent  hospitalization and her imaging reports.  I looked back over all of her previous CT scans and after discussion we decided that her most recent CT scan was indeterminant of the new area noted in the liver.  Due to this we will get a PET scan to evaluate the status of her cancer since she has had cancer that has been occult on CT scan before.  Once she has completed the PET scan she will see Dr. Lindi Adie in clinic and they will discuss treatment options.  She has received several different treatments already however could consider margetuximab.   Return to clinic in 2 weeks for follow-up with Dr. Lindi Adie

## 2021-08-05 ENCOUNTER — Other Ambulatory Visit: Payer: Self-pay

## 2021-08-05 ENCOUNTER — Ambulatory Visit (HOSPITAL_COMMUNITY)
Admit: 2021-08-05 | Discharge: 2021-08-05 | Disposition: A | Discharge status: Home / Self Care | Payer: 59 | Attending: Hematology and Oncology | Admitting: Hematology and Oncology

## 2021-08-05 DIAGNOSIS — F17210 Nicotine dependence, cigarettes, uncomplicated: Secondary | ICD-10-CM | POA: Insufficient documentation

## 2021-08-05 DIAGNOSIS — I340 Nonrheumatic mitral (valve) insufficiency: Secondary | ICD-10-CM | POA: Diagnosis not present

## 2021-08-05 DIAGNOSIS — J449 Chronic obstructive pulmonary disease, unspecified: Secondary | ICD-10-CM | POA: Insufficient documentation

## 2021-08-05 DIAGNOSIS — Z79899 Other long term (current) drug therapy: Secondary | ICD-10-CM | POA: Diagnosis not present

## 2021-08-05 DIAGNOSIS — Z01818 Encounter for other preprocedural examination: Secondary | ICD-10-CM | POA: Diagnosis not present

## 2021-08-05 DIAGNOSIS — Z5181 Encounter for therapeutic drug level monitoring: Secondary | ICD-10-CM | POA: Diagnosis not present

## 2021-08-05 DIAGNOSIS — Z0189 Encounter for other specified special examinations: Secondary | ICD-10-CM | POA: Diagnosis not present

## 2021-08-05 DIAGNOSIS — R0600 Dyspnea, unspecified: Secondary | ICD-10-CM | POA: Diagnosis not present

## 2021-08-05 LAB — ECHOCARDIOGRAM COMPLETE
Area-P 1/2: 4.06 cm2
Calc EF: 60.4 %
S' Lateral: 2.3 cm
Single Plane A2C EF: 59.5 %
Single Plane A4C EF: 58.6 %

## 2021-08-05 NOTE — Progress Notes (Signed)
°  Echocardiogram 2D Echocardiogram has been performed.  Melissa [name] 08/05/2021, 11:04 AM

## 2021-08-06 ENCOUNTER — Ambulatory Visit
Admission: RE | Admit: 2021-08-06 | Discharge: 2021-08-06 | Disposition: A | Discharge status: Home / Self Care | Payer: 59 | Source: Ambulatory Visit | Attending: Urology | Admitting: Urology

## 2021-08-06 DIAGNOSIS — C7931 Secondary malignant neoplasm of brain: Secondary | ICD-10-CM

## 2021-08-06 NOTE — Progress Notes (Addendum)
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       ICD-10-CM   1. Brain metastasis (Lt Breast Ca Primary)  C79.31      Interval Since Last Radiation:  1 month 06/29/21: These two targets were treated to a prescription dose of 20 G in a single fraction.   03/17/21: These three targets were treated to a prescription dose of 20 G in a single fraction.   08/27/20: These five targets were treated to a prescription dose of 20 G in a single fraction.    05/13/20:  SRS brain to the following 4 targets treated to a prescription dose of 20 Gy in a single fraction with a single isocenter:  PTV6 Mid Cerebellum 61mm PTV7 Lt Occipital 28mm PTV8 Lt Occipital 72mm PTV9 Lt Temporal 2mm   03/25/2019:  SRS brain//PTV 5:  Right frontal 5 mm target was treated to a prescription dose of 20 Gy in a single fraction.   09/04/2018:   SRS Brain// Right Frontal, 2 targets / 20 Gy in 1 fraction PTV3: Ant Rt Frontal 75mm 20Gy PTV4: Rt Frontal resection cavity 49mm  20Gy  12/18/2017: SRS brain//PTV2: 4 mm Rt Parietal lesion treated to 20 Gy in 1 Fx  08/25/2017, 08/28/2017, 08/30/2017, 09/01/2017, 09/04/2017: PTV1: post op SRS to right frontal lobe resection cavity in 5 fxs  Narrative:  In summary, she initially presented to the emergency Department on 07/27/2017 with complaints of headaches ongoing for approximately 5 weeks with associated nausea and vomiting. She was initially treated by her PCP for sinusitis without improvement.  She also has a history of migraine headaches and had ben evaluated in the ED at [facility] on 06/20/2017 for treatment of what she thought was a typical migraine  headache given the fact that she had her usual blurry vision and aura preceding the headache. She reports that the headache did improve with treatment but the relief was short-lived as the headache returned the very next day. She followed up with her primary care physician and a CT of the head was going to be scheduled due to the persistent headaches but in the interim, she presented to the Emergency Department at [facility] due to increased severity of the headache with associated nausea and vomiting. The headaches were occuring in the frontal lobe as well as bilateral temporal with occasional radiation to the occipital lobe and associated with blurry vision, decreased appetite, fatigue, nausea and vomiting.  She denied any difficulty with speech, memory, imbalance or focal weakness.   CT Head on admission 07/27/2017 showed a large right frontal lobe mass measuring 3.1 by 2.5 cm with significant surrounding edema with mass effect and right to left midline shift measuring 8 mm. A subsequent Brain MRI showed a 3.4 x 2.9 x 2.9 cm right frontal lobe mass with imaging characteristics of solitary metastasis and extensive vasogenic edema resulting in 9 mm right to left midline shift as well as equivocal very early left ventricle entrapment.     CT Chest, Abdomen, Pelvis on 07/28/2017 for disease staging showed no findings to suggest metastatic breast cancer involving the chest, abdomen, pelvis or  osseous structures. 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 Surgical changes involving the left breast and left axilla but no findings for local recurrence or regional adenopathy.  She proceeded with a gross total resection of the frontal lesion on 08/05/17 with Dr. Cyndy Freeze followed by adjuvant fractionated postop SRS radiotherapy to the resection cavity in February 2019. 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   She tolerated SRS treatment well and initial post treatment MRI brain on 12/07/17 showed satisfactory appearance of the right anterior frontal lobe post treatment site with expected evolution but unfortunately also showed a single, new 2 - 3 mm lesion in the right parietal lobe without associated edema or mass-effect.  She elected to undergo salvage SRS treatment to the new lesion in the parietal lobe which was completed on 12/18/17 and tolerated well.  A follow up brain MRI on 05/18/18 showed a stable right frontal resection cavity with stable 4 mm nodular enhancement at the inferior cavity margin but no evidence of new disease.  Unfortunately, the follow up MRI brain scan on 08/23/2018 showed a new 11 mm inferior right frontal lesion with only mild associated edema and no mass-effect.  She reported that she had been having more frequent frontal headaches which were not severe and responded to Aleve and otherwise, had been feeling well.  She elected to proceed with a single fraction of SRS to this new brain metastasis as recommended and this was completed on  09/04/18 and tolerated very well.   She had bronchoscopy with Dr. Valeta Harms in 05/2018 and dx'ed with Aspergillus- ID following (Dr. Graylon Good).  She also continues in routine follow up with Dr. Melvyn Novas in Pulmonology.  Follow up MRI brain scan from 03/08/19 showed continued interval enlargement of the nodular focus with enhancement at the superior margin of the right frontal resection cavity, measuring 5 x 6 mm in diameter compared with 3 x 4.5 mm on the previous study. Two smaller foci of enhancement along the inferior margin were unchanged and there was no evidence of increasing mass effect or edema and no new lesions seen elsewhere within the brain.  She elected to proceed with SRS treatment of the progressive nodular lesion which was completed on 03/25/19 and tolerated well without any acute ill effects.    She developed  systemic disease progression, particularly in the chest with increase in size of enhancing nodular left internal mammary soft tissue mass, lymph nodes and left third rib lesion and soft tissue lesion eroding the left sternal body as well as a new, hypermetabolic 6.5 cm liver lesion seen on PET scan from 05/23/2019 despite continuing on lapatinib with Letrozole. She did have an ultrasound-guided biopsy of the liver mass on 06/05/2019 which confirmed metastatic breast cancer, HER-2 positive, ER/PR negative. Her systemic therapy was changed to [place] beginning on 06/19/2019, under the care and direction of Dr. Lindi Adie which she tolerated very well.  A follow-up CT chest from 07/19/2019 showed a positive response to treatment with decrease in size of the soft tissue mass in the breast as well as bony lesions and adenopathy.  Fortunately, her follow-up MRI brain from 08/02/2019 showed stability of the previously treated disease and no new or progressive findings. A repeat MRI brain scan 11/04/19 was felt to be overall stable but there was a slight change in a previously treated right inferior frontal lesion, measuring 5 mm, previously 4 mm, with  a new, separate punctate focus of enhancement just posterior to that. This was such minimal change and when her prior treatment fields were fused with recent scan, it appeared that the new area of enhancement was on the field border of GTV3 and would have received a large portion of the delivered dose.  Therefore, consensus recommendation was to simply monitor this area with routine 3 month follow up brain imaging. She had repeat MRI on 01/30/20 and this showed overall disease stability with an unchanged appearance of the 2 previously treated right frontal lobe deposits and no new or progressive disease. Repeat systemic disease staging scans were performed on 01/29/20 and 02/04/20.  The bone scan on 01/29/20 showed less uptake of tracer at the previously identified sternal and LEFT third  rib lesions, consistent with response to therapy and there were no new sites of osseous metastasis noted. The CT C/A/P performed on 02/04/20 also showed disease stability with a new cluster of spiculated nodule or opacities in the posterior left lung favoring aspergillosis or other atypical infectious etiology.  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  There was no definite evidence of metastatic disease within the abdomen or pelvis. She continued on Kadcycla, s/p 14 cycles as of 05/05/20.   Repeat MRI brain from 05/01/20 showed 4 new metastatic lesions, all subcentimeter with a 54mm midline cerebellar vermian metastasis, 79mm left occipital metastasis, 66mm right occipital metastasis and a punctate lesion in the left temporal lobe.  The concensus recommendation was to proceed with a single fraction of SRS to the 4 new metastases which she was in agreement with and was completed on 05/13/21.  Her restaging systemic imaging from 05/28/20 was without evidence of any disease progression or recurrence in the chest, abdomen or pelvis.  At the time of her follow up with Dr. Lindi Adie on 05/05/2020, the decision was made to discontinue the Kadcycla, due to disease progression in the brain, and switch to Tucatinib, oral systemic therapy in combination with Herceptin and Xeloda which was started 06/11/20.  Her follow up brain MRI from 08/13/20 unfortunately showed disease progression with 5 new lesions: with a 2 mm left frontal lesion, 2 mm right frontal lesion, 2 mm right occipital lesion, 2 mm right temporal lesion and a 5 mm left temporal lesion.  The 1 mm right occipital lesion was initially thought to be new but on further review of prior treatment plans, it was noted to have been treated previously and stable.  Otherwise, the other previously treated lesions were  improved and/or stable.  The consensus recommendation at brain conference was to proceed with a single fraction of SRS to the 5 new brain lesions which she was in agreement with and was completed on 08/27/20.  She tolerated the treatment very well without any ill side effects aside from some low grade nausea for the first 48 hours.   In May 2022, her surveillance MRI showed no new lesions.  However, her follow up brain MRI on 03/05/21 showed 2 new brain mets and a 3rd enlarging brain met which has not previously been treated. She has been on hold for Tucatinib due to cutaneous reactions but fortunately, her restaging systemic imaging with CT C/A/P from 02/26/21 was without any evidence of new or progressive disease in the chest and no evidence of metastatic disease involving the abdomen or pelvis. We  met with her on 03/11/21 to review these findings and discuss the consensus recommendation from brain conference to proceed with a single fraction of SRS to the three new brain lesions which she was in agreement with and was completed on 03/17/21. A follow up MRI brain on 06/17/21 showed 2 new lesions in the left occipital measuring 5 mm and 4 mm.  Her imaging was reviewed in the multidisciplinary brain conference and again the consensus recommendation was to proceed with a single fraction of SRS to treat the 2 new lesions.  We met with her on 06/23/2021 she was in agreement to proceed and the treatment was completed on 06/29/2021.     Interval History:   I spoke with the patient to conduct her routine scheduled 1 month post-treatment follow up visit via telephone to spare the patient unnecessary potential exposure in the healthcare setting during the current COVID-19 pandemic.  The patient was notified in advance and gave permission to proceed with this visit format.  She tolerated the [facility] treatment well and continues without complaints.  She has been off systemic therapy for the past month due to intolerance of GI side  effects with neratinib (Nerlynx) in conjunction with herceptin.  Her most recent disease restaging CT C/A/P performed on 07/22/2021 showed a retracted appearing, hypodense lesion adjacent to the gallbladder fossa, hepatic segment V measuring 1.7 x 1.6 cm but no other evidence of new metastatic disease in the chest, abdomen or pelvis.  She is scheduled for a PET scan on 08/13/2021 for further evaluation of the new lesion.  She will follow-up with Dr. Lindi Adie on 08/16/2021 to review the results and any recommendations for a new treatment that she might tolerate better.    On review of systems, the patient states that she is doing well overall.  She is currently recovering from a recent bout of pneumonia which required hospitalization overnight but she will finish her antibiotic regimen on Sunday 08/08/2021 and feels like she is making good progress towards recovery.  Otherwise, she is without complaints aside from fatigue and chronic cough.  She has seen Dr. 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  She continues in routine follow-up under the care and direction of Dr. Tommy Medal, here locally.  She denies headaches, decreased visual or auditory acuity, tinnitus, tremor, focal weakness or seizure activity.  She does have occasional blurry vision associated with her voriconazole treatment and this has not been progressive or changed recently.  She is not having difficulty with speech or word finding. She denies abdominal pain, N/V or diarrhea. She reports a healthy appetite and is working hard to regain some of the weight she has recently lost due to the intolerance of her most recent systemic therapy.                ALLERGIES:  is allergic to aspirin, protonix [pantoprazole], doxycycline, promethazine-codeine, and iodinated contrast media.  Meds: Current  Outpatient Medications  Medication Sig Dispense Refill   acetaminophen (TYLENOL) 325 MG tablet Take 2 tablets (650 mg total) by mouth every 6 (six) hours as needed for mild pain (or Fever >/= 101). 12 tablet 0   albuterol (VENTOLIN HFA) 108 (90 Base) MCG/ACT inhaler Inhale 2 puffs into the lungs every 6 (six) hours as needed for wheezing or shortness of breath (cough). 18 g 5   amoxicillin-clavulanate (  AUGMENTIN) 875-125 MG tablet Take 1 tablet by mouth 2 (two) times daily. 14 tablet 0   budesonide-formoterol (SYMBICORT) 160-4.5 MCG/ACT inhaler Inhale 2 puffs into the lungs 2 (two) times daily. 10.2 g 3   chlorpheniramine-HYDROcodone (TUSSIONEX) 10-8 MG/5ML SUER Take 5 mLs by mouth at bedtime as needed for cough. 115 mL 0   lidocaine-prilocaine (EMLA) cream APPLY EXTERNALLY TO THE AFFECTED AREA 1 TIME 30 g 3   Multiple Vitamins-Minerals (EMERGEN-C VITAMIN C PO) Take by mouth.     Vitamin E 200 units TABS Take by mouth.     voriconazole (VFEND) 200 MG tablet Take 1 tablet (200 mg total) by mouth 2 (two) times daily. 60 tablet 11   No current facility-administered medications for this encounter.   Facility-Administered Medications Ordered in Other Encounters  Medication Dose Route Frequency Provider Last Rate Last Admin   alteplase (CATHFLO ACTIVASE) injection 2 mg  2 mg Intracatheter Once PRN Nicholas Lose, MD       sodium chloride flush (NS) 0.9 % injection 10 mL  10 mL Intracatheter PRN Nicholas Lose, MD   10 mL at 07/06/21 1428    Physical Findings: Unable to assess due to telephone follow up visit format.  Lab Findings: Lab Results  Component Value Date   WBC 16.2 (H) 08/03/2021   WBC 8.7 07/26/2021   HGB 9.6 (L) 08/03/2021   HGB 13.1 08/12/2014   HCT 29.5 (L) 08/03/2021   HCT 40.4 08/12/2014   PLT 441 (H) 08/03/2021   PLT 228 08/12/2014    Lab Results  Component Value Date   NA 137 08/03/2021   NA 142 08/12/2014   K 4.3 08/03/2021   K 4.6 08/12/2014   CHLORIDE 107  08/12/2014   CO2 27 08/03/2021   CO2 26 08/12/2014   GLUCOSE 116 (H) 08/03/2021   GLUCOSE 94 08/12/2014   GLUCOSE 98 12/20/2012   BUN 16 08/03/2021   BUN 10.1 08/12/2014   CREATININE 0.83 08/03/2021   CREATININE 0.88 03/11/2019   CREATININE 0.9 08/12/2014   BILITOT 0.2 (L) 08/03/2021   BILITOT 0.25 08/12/2014   ALKPHOS 89 08/03/2021   ALKPHOS 68 08/12/2014   AST 15 08/03/2021   AST 15 08/12/2014   ALT 12 08/03/2021   ALT 11 08/12/2014   PROT 7.4 08/03/2021   PROT 7.1 08/12/2014   ALBUMIN 3.7 08/03/2021   ALBUMIN 3.8 08/12/2014   CALCIUM 9.3 08/03/2021   CALCIUM 9.3 08/12/2014   ANIONGAP 6 08/03/2021    Radiographic Findings: DG Chest 2 View  Result Date: 07/25/2021 CLINICAL DATA:  Chest pain, COPD, breast cancer EXAM: CHEST - 2 VIEW COMPARISON:  12/31/2019 chest radiograph. FINDINGS: Stable right internal jugular Port-A-Cath terminating in the lower third of the SVC. Stable cardiomediastinal silhouette with normal heart size. No pneumothorax. No right pleural effusion. Volume loss in the left hemithorax. New patchy consolidation at the left lung base. Increased round lucencies and streaky opacities in the upper left lung. Surgical clips overlie the left axilla. IMPRESSION: 1. New patchy consolidation at the left lung base, compatible with pneumonia in the correct clinical setting. 2. Increased round lucencies and streaky opacities in the upper left lung, compatible with progressive chronic cavitary disease, probably infectious, see 07/22/2021 chest CT report for details. Electronically Signed   By: Ilona Sorrel M.D.   On: 07/25/2021 10:57   CT Angio Chest PE W and/or Wo Contrast  Result Date: 07/25/2021 CLINICAL DATA:  Short of breath. Pain with inspiration. Symptoms beginning 2 days ago.  EXAM: CT ANGIOGRAPHY CHEST WITH CONTRAST TECHNIQUE: Multidetector CT imaging of the chest was performed using the standard protocol during bolus administration of intravenous contrast. Multiplanar  CT image reconstructions and MIPs were obtained to evaluate the vascular anatomy. RADIATION DOSE REDUCTION: This exam was performed according to the departmental dose-optimization program which includes automated exposure control, adjustment of the mA and/or kV according to patient size and/or use of iterative reconstruction technique. CONTRAST:  54mL OMNIPAQUE IOHEXOL 350 MG/ML SOLN COMPARISON:  07/22/2021 FINDINGS: Cardiovascular: Well opacified pulmonary arteries. There is no evidence of a pulmonary embolism. Heart normal in size and configuration. No pericardial effusion. Aorta normal in caliber. No dissection. No atherosclerosis. Arch branch vessels are widely patent. Mediastinum/Nodes: No neck base or mediastinal masses. Left hilar structures retracted superiorly. There is soft tissue surrounding the left hilar bronchi and vessels, as well as surgical vascular clips at the level of the left upper lobe bronchus. Findings consistent with a left upper lobectomy. An oval area of soft tissue adjacent to the superior left pulmonary vein appears new, 1.2 cm in short axis, possibly an enlarged lymph node. No right hilar mass or adenopathy. Trachea and esophagus are unremarkable. Lungs/Pleura: Patchy opacity in the left lung base has increased from the prior study, now associated with a minimal pleural effusion. There are multiple left lung serpiginous cystic lesions, several with air-fluid levels, stable from the prior exam. Right lung is hyperexpanded, essentially clear. Minor paraseptal emphysema noted at the right apex and mild centrilobular emphysema noted bilaterally. No right pleural effusion. No pneumothorax. Upper Abdomen: Unremarkable. Musculoskeletal: No fracture or acute finding. No bone lesion. No chest wall mass. Changes from previous left breast surgery. Review of the MIP images confirms the above findings. IMPRESSION: 1. No evidence of a pulmonary embolism. 2. Increased opacity at the left lung base  associated a minimal pleural effusion. Pleural effusion is new and lung base opacity has increased from the prior exam, suspicious for pneumonia. 3. Multiple left lung serpiginous cystic lesions, several with dependent fluid. These are without change from the prior chest CT and suspected to be the sequelae of chronic atypical infection, particularly fungal. 4. Right lung remains clear. 5. Emphysema Emphysema (ICD10-J43.9). Electronically Signed   By: Lajean Manes M.D.   On: 07/25/2021 17:46   CT CHEST ABDOMEN PELVIS W CONTRAST  Result Date: 07/22/2021 CLINICAL DATA:  Metastatic breast cancer restaging EXAM: CT CHEST, ABDOMEN, AND PELVIS WITH CONTRAST TECHNIQUE: Multidetector CT imaging of the chest, abdomen and pelvis was performed following the standard protocol during bolus administration of intravenous contrast. RADIATION DOSE REDUCTION: This exam was performed according to the departmental dose-optimization program which includes automated exposure control, adjustment of the mA and/or kV according to patient size and/or use of iterative reconstruction technique. CONTRAST:  22mL OMNIPAQUE IOHEXOL 300 MG/ML SOLN, additional oral enteric contrast COMPARISON:  02/26/2021, 05/28/2020 FINDINGS: CT CHEST FINDINGS Cardiovascular: Right chest port catheter. Normal heart size. No pericardial effusion. Mediastinum/Nodes: No enlarged mediastinal, hilar, or axillary lymph nodes. Thyroid gland, trachea, and esophagus demonstrate no significant findings. Lungs/Pleura: Redemonstrated postoperative findings of left upper lobectomy. Multiple generally thin walled, interconnected serpiginous cavitary lesions throughout the remaining left lower lobe are unchanged, some containing small air-fluid levels, with an interval increase in consolidation and nodularity in the dependent left lung base (series 4, image 100). Right lung is normally aerated. No pleural effusion or pneumothorax. Musculoskeletal: No chest wall mass.  Unchanged subtle sclerotic lesion at the left aspect of the sternal body, at the  site of a previously seen FDG avid osseous metastasis (series 5, image 27). Status post left lumpectomy and axillary dissection. CT ABDOMEN PELVIS FINDINGS Hepatobiliary: Unchanged retracted appearing, hypodense lesion adjacent to the gallbladder fossa, hepatic segment V, measuring 1.7 x 1.6 cm (series 2, image 75). No gallstones, gallbladder wall thickening, or biliary dilatation. Pancreas: Unremarkable. No pancreatic ductal dilatation or surrounding inflammatory changes. Spleen: Normal in size without significant abnormality. Adrenals/Urinary Tract: Adrenal glands are unremarkable. Kidneys are normal, without renal calculi, solid lesion, or hydronephrosis. Bladder is unremarkable. Stomach/Bowel: Stomach is within normal limits. Appendix appears normal. No evidence of bowel wall thickening, distention, or inflammatory changes. Vascular/Lymphatic: Aortic atherosclerosis. No enlarged abdominal or pelvic lymph nodes. Reproductive: No mass or other abnormality. Other: No abdominal wall hernia or abnormality. No ascites. Musculoskeletal: No acute osseous findings. IMPRESSION: 1. Redemonstrated postoperative findings of left upper lobectomy. 2. Multiple generally thin walled, interconnected serpiginous cavitary lesions throughout the remaining left lower lobe are unchanged, some containing small air-fluid levels, with an interval increase in consolidation and nodularity in the dependent left lung base. Findings are consistent with chronic sequelae of atypical infection, particularly fungal, with interval evidence of ongoing, worsened infection or aspiration at the left lung base. 3. Unchanged treated liver metastasis adjacent to the gallbladder fossa. 4. Unchanged subtle sclerotic lesion at the left aspect of the sternal body, at the site of a previously seen FDG avid osseous metastasis. 5. No evidence of new metastatic disease in the chest,  abdomen, or pelvis. Aortic Atherosclerosis (ICD10-I70.0). Electronically Signed   By: Delanna Ahmadi M.D.   On: 07/22/2021 16:31   ECHOCARDIOGRAM COMPLETE  Result Date: 08/05/2021    ECHOCARDIOGRAM REPORT   Patient Name:   Melissa [name] [facility] Date of Exam: 08/05/2021 Medical Rec #:  [id number]     Height:       62.0 in Accession #:    [id number]    Weight:       88.6 lb Date of Birth:  03/26/1966     BSA:          1.353 m Patient Age:    68 years      BP:           113/70 mmHg Patient Gender: F             HR:           78 bpm. Exam Location:  Outpatient Procedure: 2D Echo, Cardiac Doppler, Color Doppler and Strain Analysis Indications:    Chemo Z09  History:        Patient has prior history of Echocardiogram examinations, most                 recent 01/27/2021. COPD, Signs/Symptoms:Dyspnea; Risk                 Factors:Current Smoker.  Sonographer:    Bernadene Person RDCS Referring Phys: [id number] Nicholas Lose IMPRESSIONS  1. Left ventricular ejection fraction, by estimation, is 60 to 65%. The left ventricle has normal function. The left ventricle has no regional wall motion abnormalities. Left ventricular diastolic parameters are consistent with Grade I diastolic dysfunction (impaired relaxation). The average left ventricular global longitudinal strain is -21.5 %. The global longitudinal strain is normal.  2. Right ventricular systolic function is normal. The right ventricular size is normal. Tricuspid regurgitation signal is inadequate for assessing PA pressure.  3. The mitral valve is normal in structure. Mild mitral valve regurgitation. No evidence of mitral stenosis.  4.  The aortic valve is tricuspid. Aortic valve regurgitation is not visualized. No aortic stenosis is present.  5. The inferior vena cava is normal in size with greater than 50% respiratory variability, suggesting right atrial pressure of 3 mmHg. Comparison(s): No significant change from prior study. FINDINGS  Left Ventricle: Left ventricular ejection  fraction, by estimation, is 60 to 65%. The left ventricle has normal function. The left ventricle has no regional wall motion abnormalities. The average left ventricular global longitudinal strain is -21.5 %. The global longitudinal strain is normal. The left ventricular internal cavity size was normal in size. There is no left ventricular hypertrophy. Left ventricular diastolic parameters are consistent with Grade I diastolic dysfunction (impaired relaxation). Right Ventricle: The right ventricular size is normal. Right ventricular systolic function is normal. Tricuspid regurgitation signal is inadequate for assessing PA pressure. The tricuspid regurgitant velocity is 1.78 m/s, and with an assumed right atrial  pressure of 3 mmHg, the estimated right ventricular systolic pressure is 29.9 mmHg. Left Atrium: Left atrial size was normal in size. Right Atrium: Right atrial size was normal in size. Pericardium: There is no evidence of pericardial effusion. Mitral Valve: The mitral valve is normal in structure. Mild mitral valve regurgitation. No evidence of mitral valve stenosis. Tricuspid Valve: The tricuspid valve is normal in structure. Tricuspid valve regurgitation is trivial. No evidence of tricuspid stenosis. Aortic Valve: The aortic valve is tricuspid. Aortic valve regurgitation is not visualized. No aortic stenosis is present. Pulmonic Valve: The pulmonic valve was normal in structure. Pulmonic valve regurgitation is trivial. No evidence of pulmonic stenosis. Aorta: The aortic root is normal in size and structure. Venous: The inferior vena cava is normal in size with greater than 50% respiratory variability, suggesting right atrial pressure of 3 mmHg. IAS/Shunts: No atrial level shunt detected by color flow Doppler.  LEFT VENTRICLE PLAX 2D LVIDd:         4.40 cm     Diastology LVIDs:         2.30 cm     LV e' medial:    6.08 cm/s LV PW:         1.00 cm     LV E/e' medial:  9.5 LV IVS:        0.90 cm     LV e'  lateral:   10.60 cm/s LVOT diam:     2.10 cm     LV E/e' lateral: 5.5 LV SV:         74 LV SV Index:   55          2D Longitudinal Strain LVOT Area:     3.46 cm    2D Strain GLS Avg:     -21.5 %  LV Volumes (MOD) LV vol d, MOD A2C: 57.3 ml LV vol d, MOD A4C: 58.4 ml LV vol s, MOD A2C: 23.2 ml LV vol s, MOD A4C: 24.2 ml LV SV MOD A2C:     34.1 ml LV SV MOD A4C:     58.4 ml LV SV MOD BP:      36.3 ml RIGHT VENTRICLE RV S prime:     9.97 cm/s TAPSE (M-mode): 1.6 cm LEFT ATRIUM             Index        RIGHT ATRIUM          Index LA diam:        2.00 cm 1.48 cm/m   RA Area:  8.82 cm LA Vol (A2C):   25.4 ml 18.78 ml/m  RA Volume:   17.60 ml 13.01 ml/m LA Vol (A4C):   17.7 ml 13.09 ml/m LA Biplane Vol: 22.1 ml 16.34 ml/m  AORTIC VALVE LVOT Vmax:   126.00 cm/s LVOT Vmean:  78.800 cm/s LVOT VTI:    0.213 m  AORTA Ao Root diam: 3.20 cm Ao Asc diam:  2.90 cm MITRAL VALVE               TRICUSPID VALVE MV Area (PHT): 4.06 cm    TR Peak grad:   12.7 mmHg MV Decel Time: 187 msec    TR Vmax:        178.00 cm/s MV E velocity: 57.80 cm/s MV A velocity: 49.30 cm/s  SHUNTS MV E/A ratio:  1.17        Systemic VTI:  0.21 m                            Systemic Diam: 2.10 cm Kirk Ruths MD Electronically signed by Kirk Ruths MD Signature Date/Time: 08/05/2021/12:22:14 PM    Final     Impression/Plan: 45.         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     She appears to have recovered well form the effects of her recent [facility] treatment and is currently without complaints.  We discussed the plan to obtain a post-treatment brain MRI in March 2023 and pending this is stable, we will resume surveillance imaging every 3 months going forward.  I will contact her by phone following each scan to review results and recommendations from brain tumor board but she knows that she is welcome to call at anytime in the interim with any questions or  concerns.  She will also continue in routine follow-up under the care and direction of Dr. Lindi Adie for continued management of her systemic disease.  Her most recent disease restaging CT C/A/P performed on 07/22/2021 showed a retracted appearing, hypodense lesion adjacent to the gallbladder fossa, hepatic segment V measuring 1.7 x 1.6 cm but no other evidence of new metastatic disease in the chest, abdomen or pelvis.  She is scheduled for a PET scan on 08/13/2021 for further evaluation of the new lesion.  She will follow-up with Dr. Lindi Adie on 08/16/2021 to review the results and any recommendations for a new treatment that she might tolerate better.  She knows that she is welcome to call at anytime with any questions or concerns related to her previous radiation.   Given current concerns for patient exposure during the COVID-19 pandemic, this encounter was conducted via telephone. The patient was notified in advance and was offered a [place] meeting to allow for face to face communication but unfortunately reported that she did not have the appropriate resources/technology to support such a visit and instead preferred to proceed with telephone follow up visit. The patient has given verbal consent for this type of encounter. The time spent during this encounter was 15 minutes with 50% of time spent in coordination of care. The attendants for this meeting include Greidys Deland PA-C, and patient, Melissa [name]. During the encounter, Weyman Bogdon PA-C was located at [facility] Radiation Oncology Department.  Patient, Melissa [name] was located at home.    Nicholos Johns, MMS, PA-C [facility] [place]: [phone]   Fax: [phone]

## 2021-08-11 ENCOUNTER — Other Ambulatory Visit (HOSPITAL_COMMUNITY): Payer: Self-pay

## 2021-08-12 ENCOUNTER — Other Ambulatory Visit (HOSPITAL_COMMUNITY): Payer: Self-pay

## 2021-08-12 ENCOUNTER — Other Ambulatory Visit: Payer: Self-pay

## 2021-08-12 ENCOUNTER — Encounter (HOSPITAL_COMMUNITY)
Admission: RE | Admit: 2021-08-12 | Discharge: 2021-08-12 | Disposition: A | Discharge status: Home / Self Care | Payer: 59 | Source: Ambulatory Visit | Attending: Adult Health | Admitting: Adult Health

## 2021-08-12 DIAGNOSIS — C50212 Malignant neoplasm of upper-inner quadrant of left female breast: Secondary | ICD-10-CM | POA: Diagnosis present

## 2021-08-12 DIAGNOSIS — Z170 Estrogen receptor positive status [ER+]: Secondary | ICD-10-CM | POA: Insufficient documentation

## 2021-08-12 LAB — GLUCOSE, CAPILLARY: Glucose-Capillary: 92 mg/dL (ref 70–99)

## 2021-08-12 MED ORDER — FLUDEOXYGLUCOSE F - 18 (FDG) INJECTION
4.9800 | Freq: Once | INTRAVENOUS | Status: AC | PRN
  Administered 2021-08-12: 4.98 via INTRAVENOUS

## 2021-08-14 NOTE — Progress Notes (Signed)
Patient Care Team: Patient, [name] as PCP - General (General Practice) [name], MD as Referring Physician (Emergency Medicine)  DIAGNOSIS:    ICD-10-CM   1. Malignant neoplasm of upper-inner quadrant of left breast in female, estrogen receptor positive ([place])  C50.212 CBC with Differential ([place] Only)   Z17.0 [place] ([place] only)    Lipid panel      SUMMARY OF ONCOLOGIC HISTORY: Oncology History  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 Ki-67 was 70% HER-2 was amplified with a ratio 2.91   07/12/2012 - 07/17/2013 Neo-Adjuvant Chemotherapy   TCH 6 followed by Herceptin maintenance   12/11/2012 Surgery   Left breast lumpectomy: 1.8 cm tumor 1 positive sentinel node, axillary lymph node dissection 02/08/2013 showed 0/13 lymph nodes   03/25/2013 - 05/06/2013 Radiation Therapy   Adjuvant radiation therapy   06/05/2013 - 07/20/2017 Anti-estrogen oral therapy   Tamoxifen 20 mg daily   07/27/2017 Relapse/Recurrence   MRI Brain: 3.4 x 2.9 x 2.9 cm RIGHT frontal lobe mass with imaging characteristics of solitary metastasis. Extensive vasogenic edema resulting in 9 mm RIGHT to LEFT midline shift. Equivocal very early LEFT ventricle entrapment.    08/04/2017 Surgery   Rt frontal brain resection: Poorly differentiated tumor IHC suggests breast primary ER and PR Positive   08/25/2017 - 09/04/2017 Radiation Therapy   Stereotactic radiation   09/18/2017 -  Anti-estrogen oral therapy   Lapatinib with letrozole   12/18/2017 - 12/19/2017 Radiation Therapy   New right parietal lobe metastases status post [facility]   08/27/2018 - 08/27/2020 Radiation Therapy   SRS to new brain metastases   05/09/2019 Relapse/Recurrence   Interval increase in  size of the enhancing nodular left internal mammary soft tissue 2.8 cm.  Redemonstrated enlarged supraclavicular, lower cervical and lower posterior cervical nodes unchanged.  Interval increase in the bony erosion of the posterior and lateral left third rib, increasing soft tissue lesion eroding the left sternal body 3.1 cm was 2.5 cm.  Bronchiectatic changes   06/19/2019 - 05/05/2020 Chemotherapy   ado-trastuzumab emtansine (KADCYLA)     06/15/2020 -  Chemotherapy   Xeloda, Tucatinib, Herceptin    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/Initial Ca 1997, Brain Mets 2019  06/10/2019 Initial Diagnosis   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[facility])   06/19/2019 - 05/05/2020 Chemotherapy   ado-trastuzumab emtansine (KADCYLA)     Port-A-Cath in place    CHIEF COMPLIANT: Follow-up of metastatic breast cancer  INTERVAL HISTORY: Melissa [name] is a 56 y.o. with above-mentioned history of metastatic breast cancer with brain metastasis s/p resection who is currently on treatment with Xeloda and Herceptin. She presents to the clinic today for follow-up.  She had recent scans and is here to discuss results.  Overall she complains of respiratory symptoms but denies any chest pain.  Complains of fatigue as well.  ALLERGIES:  is allergic to aspirin, protonix [pantoprazole], doxycycline, promethazine-codeine, and iodinated contrast media.  MEDICATIONS:  Current Outpatient Medications  Medication Sig Dispense Refill   chlorpheniramine-HYDROcodone 10-8 MG/5ML Take 5 mLs by mouth 2 (two) times daily. 115 mL 0   acetaminophen (TYLENOL) 325 MG tablet Take 2 tablets (650 mg total) by mouth every 6 (six) hours as needed for mild pain (or  Fever >/= 101). 12 tablet 0   albuterol (VENTOLIN HFA) 108 (90 Base) MCG/ACT inhaler Inhale 2 puffs into the lungs every 6 (six) hours as needed for wheezing or shortness of breath (cough). 18 g 5   amoxicillin-clavulanate (AUGMENTIN) 875-125 MG tablet Take 1 tablet by  mouth 2 (two) times daily. 14 tablet 0   budesonide-formoterol (SYMBICORT) 160-4.5 MCG/ACT inhaler Inhale 2 puffs into the lungs 2 (two) times daily. 10.2 g 3   lidocaine-prilocaine (EMLA) cream APPLY EXTERNALLY TO THE AFFECTED AREA 1 TIME 30 g 3   Multiple Vitamins-Minerals (EMERGEN-C VITAMIN C PO) Take by mouth.     Vitamin E 200 units TABS Take by mouth.     voriconazole (VFEND) 200 MG tablet Take 1 tablet (200 mg total) by mouth 2 (two) times daily. 60 tablet 11   No current facility-administered medications for this visit.   Facility-Administered Medications Ordered in Other Visits  Medication Dose Route Frequency Provider Last Rate Last Admin   alteplase (CATHFLO ACTIVASE) injection 2 mg  2 mg Intracatheter Once PRN Nicholas Lose, MD       sodium chloride flush (NS) 0.9 % injection 10 mL  10 mL Intracatheter PRN Nicholas Lose, MD   10 mL at 07/06/21 1428    PHYSICAL EXAMINATION: ECOG PERFORMANCE STATUS: 1 - Symptomatic but completely ambulatory  Vitals:   08/16/21 0831  BP: 119/66  Pulse: 78  Resp: 18  Temp: (!) 97.2 F (36.2 C)  SpO2: 100%   Filed Weights   08/16/21 0831  Weight: 91 lb 14.4 oz (41.7 kg)      LABORATORY DATA:  I have reviewed the data as listed CMP Latest Ref Rng & Units 08/16/2021 08/03/2021 07/26/2021  Glucose 70 - 99 mg/dL 81 116(H) 152(H)  BUN 6 - 20 mg/dL _0 Creatinine 0.44 - 1.00 mg/dL 0.87 0.83 0.63  Sodium 135 - 145 mmol/L 139 137 136  Potassium 3.5 - 5.1 mmol/L 4.2 4.3 3.8  Chloride 98 - 111 mmol/L 105 104 108  CO2 22 - 32 mmol/L _1 Calcium 8.9 - 10.3 mg/dL 9.7 9.3 7.9(L)  Total Protein 6.5 - 8.1 g/dL 8.0 7.4 -  Total Bilirubin 0.3 - 1.2 mg/dL 0.2(L) 0.2(L) -  Alkaline Phos 38 - 126 U/L 84 89 -  AST 15 - 41 U/L 18 15 -  ALT 0 - 44 U/L 12 12 -    Lab Results  Component Value Date   WBC 9.9 08/16/2021   HGB 10.3 (L) 08/16/2021   HCT 32.4 (L) 08/16/2021   MCV 84.6 08/16/2021   PLT 407 (H) 08/16/2021   NEUTROABS 6.2  08/16/2021    ASSESSMENT & PLAN:  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  [place] brain: 08/25/2017-09/04/2017 2. Anti Her 2 therapy with Lapatinib started 09/17/2017-05/14/2019: Stopped for progression 3.  I discontinued tamoxifen and started her on letrozole 2.5 mg daily.  05/14/2019 stopped for progression 4.  Stereotactic radiosurgery 12/19/2017 to the new right parietal lobe metastases. 5.  Kadcyla: Received 16 cycles discontinued 05/05/2020 6.  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  The spot on the liver noted on the CT scan did not have any activity on the PET scan.  Postinflammatory changes in the lungs chronic sequelae of atypical infection  Based on this we decided to continue with Herceptin maintenance alone.  The only other option would be Margetuximab.    Orders Placed This Encounter  Procedures   CBC with Differential ([place] Only)    Standing Status:   Future    Standing Expiration Date:   08/16/2022   CMP ([place] only)    Standing Status:   Future    Standing Expiration Date:   08/16/2022   Lipid panel    Standing Status:   Future    Standing Expiration Date:   08/16/2022   The patient has a good understanding of the overall plan. she agrees with it. she will call with any problems that may develop before the next visit here.  Total time spent: 30 mins including face to face time and time spent for planning, charting and coordination of care  Rulon Eisenmenger, MD, MPH 08/16/2021  I, Thana Ates, am acting as scribe for Dr. Nicholas Lose.  I have reviewed the above documentation for accuracy and completeness, and I agree with the above.

## 2021-08-15 ENCOUNTER — Encounter: Payer: Self-pay | Admitting: Hematology and Oncology

## 2021-08-15 NOTE — Progress Notes (Signed)
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  Narrative: The patient tolerated radiation treatment relatively well.     Plan: The patient has completed radiation treatment. The patient will return to radiation oncology clinic for routine followup in one month. I advised her to call or return sooner if she has any questions or concerns related to her recovery or treatment. ________________________________  Sheral Apley. Tammi Klippel, M.D.

## 2021-08-16 ENCOUNTER — Other Ambulatory Visit: Payer: Self-pay

## 2021-08-16 ENCOUNTER — Inpatient Hospital Stay: Payer: 59 | Attending: Medical

## 2021-08-16 ENCOUNTER — Inpatient Hospital Stay: Payer: 59

## 2021-08-16 ENCOUNTER — Inpatient Hospital Stay: Payer: 59 | Admitting: Hematology and Oncology

## 2021-08-16 DIAGNOSIS — C50912 Malignant neoplasm of unspecified site of left female breast: Secondary | ICD-10-CM

## 2021-08-16 DIAGNOSIS — Z5112 Encounter for antineoplastic immunotherapy: Secondary | ICD-10-CM | POA: Diagnosis present

## 2021-08-16 DIAGNOSIS — C7951 Secondary malignant neoplasm of bone: Secondary | ICD-10-CM | POA: Insufficient documentation

## 2021-08-16 DIAGNOSIS — C787 Secondary malignant neoplasm of liver and intrahepatic bile duct: Secondary | ICD-10-CM | POA: Diagnosis present

## 2021-08-16 DIAGNOSIS — Z95828 Presence of other vascular implants and grafts: Secondary | ICD-10-CM

## 2021-08-16 DIAGNOSIS — C7931 Secondary malignant neoplasm of brain: Secondary | ICD-10-CM | POA: Diagnosis not present

## 2021-08-16 DIAGNOSIS — T451X5A Adverse effect of antineoplastic and immunosuppressive drugs, initial encounter: Secondary | ICD-10-CM

## 2021-08-16 DIAGNOSIS — K521 Toxic gastroenteritis and colitis: Secondary | ICD-10-CM

## 2021-08-16 DIAGNOSIS — Z452 Encounter for adjustment and management of vascular access device: Secondary | ICD-10-CM | POA: Diagnosis not present

## 2021-08-16 DIAGNOSIS — C50212 Malignant neoplasm of upper-inner quadrant of left female breast: Secondary | ICD-10-CM

## 2021-08-16 DIAGNOSIS — Z170 Estrogen receptor positive status [ER+]: Secondary | ICD-10-CM | POA: Diagnosis not present

## 2021-08-16 LAB — CBC WITH DIFFERENTIAL (CANCER CENTER ONLY)
Abs Immature Granulocytes: 0.03 K/uL (ref 0.00–0.07)
Basophils Absolute: 0.1 K/uL (ref 0.0–0.1)
Basophils Relative: 1 %
Eosinophils Absolute: 0.9 K/uL — ABNORMAL HIGH (ref 0.0–0.5)
Eosinophils Relative: 9 %
HCT: 32.4 % — ABNORMAL LOW (ref 36.0–46.0)
Hemoglobin: 10.3 g/dL — ABNORMAL LOW (ref 12.0–15.0)
Immature Granulocytes: 0 %
Lymphocytes Relative: 20 %
Lymphs Abs: 1.9 K/uL (ref 0.7–4.0)
MCH: 26.9 pg (ref 26.0–34.0)
MCHC: 31.8 g/dL (ref 30.0–36.0)
MCV: 84.6 fL (ref 80.0–100.0)
Monocytes Absolute: 0.7 K/uL (ref 0.1–1.0)
Monocytes Relative: 7 %
Neutro Abs: 6.2 K/uL (ref 1.7–7.7)
Neutrophils Relative %: 63 %
Platelet Count: 407 K/uL — ABNORMAL HIGH (ref 150–400)
RBC: 3.83 MIL/uL — ABNORMAL LOW (ref 3.87–5.11)
RDW: 19.3 % — ABNORMAL HIGH (ref 11.5–15.5)
WBC Count: 9.9 K/uL (ref 4.0–10.5)
nRBC: 0 % (ref 0.0–0.2)

## 2021-08-16 LAB — CMP (CANCER CENTER ONLY)
ALT: 12 U/L (ref 0–44)
AST: 18 U/L (ref 15–41)
Albumin: 4.1 g/dL (ref 3.5–5.0)
Alkaline Phosphatase: 84 U/L (ref 38–126)
Anion gap: 7 (ref 5–15)
BUN: 15 mg/dL (ref 6–20)
CO2: 27 mmol/L (ref 22–32)
Calcium: 9.7 mg/dL (ref 8.9–10.3)
Chloride: 105 mmol/L (ref 98–111)
Creatinine: 0.87 mg/dL (ref 0.44–1.00)
GFR, Estimated: >60 mL/min
Glucose, Bld: 81 mg/dL (ref 70–99)
Potassium: 4.2 mmol/L (ref 3.5–5.1)
Sodium: 139 mmol/L (ref 135–145)
Total Bilirubin: 0.2 mg/dL — ABNORMAL LOW (ref 0.3–1.2)
Total Protein: 8 g/dL (ref 6.5–8.1)

## 2021-08-16 LAB — MAGNESIUM: Magnesium: 2 mg/dL (ref 1.7–2.4)

## 2021-08-16 MED ORDER — SODIUM CHLORIDE 0.9% FLUSH
10.0000 mL | INTRAVENOUS | Status: DC | PRN
  Administered 2021-08-16: 10 mL

## 2021-08-16 MED ORDER — HYDROCOD POLI-CHLORPHE POLI ER 10-8 MG/5ML PO SUER: 5.0000 mL | Freq: Two times a day (BID) | ORAL | 0 refills | Status: DC

## 2021-08-16 MED ORDER — HEPARIN SOD (PORK) LOCK FLUSH 100 UNIT/ML IV SOLN
500.0000 [IU] | Freq: Once | INTRAVENOUS | Status: AC | PRN
  Administered 2021-08-16: 500 [IU]

## 2021-08-16 NOTE — Assessment & Plan Note (Addendum)
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 Anti Her 2 therapy with Lapatinibstarted 09/17/2017-05/14/2019: Stopped for progression 3.I discontinuedtamoxifen and started her on letrozole 2.5 mg daily.05/14/2019 stopped for progression 4.Stereotactic radiosurgery 12/19/2017 to the new right parietal lobe metastases. 5.Kadcyla: Received 16 cycles discontinued 05/05/2020 6.Tucatinib Xeloda: Discontinued because of hand-foot syndrome 06/11/2020-12/15/2020 -------------------------------------------------------------------------------------------------------------------- Liver Biopsy 06/05/19: Metastatic cancer, ER/PR: 0%, Her 2: 3+ Positive, Ki 67: 20% Patient hadmetastases to liver, bone, brain, and questionably lung  Bone metastases: Because of dental issues bisphosphonates were not started  New brain metastases: Brain MRI 05/01/2020: Bilateral occipital, left temporal and cerebellarmets: Status post SBRT 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  The spot on the liver noted on  the CT scan did not have any activity on the PET scan.  Postinflammatory changes in the lungs chronic sequelae of atypical infection  Based on this we decided to continue with Herceptin maintenance alone.  The only other option would be Margetuximab.

## 2021-08-17 ENCOUNTER — Telehealth: Payer: Self-pay | Admitting: Hematology and Oncology

## 2021-08-17 NOTE — Telephone Encounter (Signed)
Scheduled appointment per 2/6 los. Left message.

## 2021-08-18 ENCOUNTER — Encounter: Payer: Self-pay | Admitting: Infectious Disease

## 2021-08-18 ENCOUNTER — Ambulatory Visit: Payer: 59 | Admitting: Infectious Disease

## 2021-08-18 ENCOUNTER — Other Ambulatory Visit: Payer: Self-pay

## 2021-08-18 VITALS — BP 125/78 | HR 101 | Temp 98.5°F | Wt 93.0 lb

## 2021-08-18 DIAGNOSIS — F17210 Nicotine dependence, cigarettes, uncomplicated: Secondary | ICD-10-CM

## 2021-08-18 DIAGNOSIS — B449 Aspergillosis, unspecified: Secondary | ICD-10-CM | POA: Diagnosis not present

## 2021-08-18 DIAGNOSIS — B479 Mycetoma, unspecified: Secondary | ICD-10-CM | POA: Diagnosis not present

## 2021-08-18 DIAGNOSIS — Z170 Estrogen receptor positive status [ER+]: Secondary | ICD-10-CM

## 2021-08-18 DIAGNOSIS — B441 Other pulmonary aspergillosis: Secondary | ICD-10-CM

## 2021-08-18 DIAGNOSIS — Z23 Encounter for immunization: Secondary | ICD-10-CM

## 2021-08-18 DIAGNOSIS — Z8619 Personal history of other infectious and parasitic diseases: Secondary | ICD-10-CM

## 2021-08-18 DIAGNOSIS — J189 Pneumonia, unspecified organism: Secondary | ICD-10-CM | POA: Diagnosis not present

## 2021-08-18 DIAGNOSIS — J479 Bronchiectasis, uncomplicated: Secondary | ICD-10-CM

## 2021-08-18 DIAGNOSIS — C50212 Malignant neoplasm of upper-inner quadrant of left female breast: Secondary | ICD-10-CM

## 2021-08-18 MED ORDER — BUDESONIDE-FORMOTEROL FUMARATE 160-4.5 MCG/ACT IN AERO: 2.0000 | INHALATION_SPRAY | Freq: Two times a day (BID) | RESPIRATORY_TRACT | 11 refills | Status: DC

## 2021-08-18 NOTE — Progress Notes (Signed)
Subjective:  Complaint: Chronic cough that is blood-streaked more so in the morning   Patient ID: Melissa [name], female    DOB: 1966-03-31, 56 y.o.   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  Along the way she was diagnosed with breast cancer 2013 underwent lumpectomy and chemotherapy.  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  She is following with Dr. Lindi Adie with oncology.  She is subsequent been found to have other metastases including to her sternum and liver.  She developed cavitary lung infection and underwent bronchoscopy which revealed Aspergillus fumigatus.  She was on Cresemba since then.  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  She also has some spiculated lesions in the posterior left lobe lobe of her lung.  Radiology is concerned that the apparent mycetoma is due to Aspergillus or another mold.  There was similar concerned that the spiculated areas could represent Aspergillus though we have no culture data to guide [name].  She continued on Cresemba at that point  She then had bronchoscopy with BAL.  Fungal cultures from the bronchoscopy have been negative though the patient was on Cresemba at the time.  There is 1 AFB culture that is positive but not for tuberculosis.  Since then apparently (and I had to go to [employer] on web (not available on epic) they stated that Mycobacteria could NOT grow on subculture so no ID  In the interim the patient's insurance had denied Cresemba and she has been changed to voriconazole.   Unfortunately she has been found to have metastases of her breast cancer to the brain.  She  underwent stereotactic radiation to these lesions by Dr. London Pepper  Her chemotherapeutic regimen previously included Tukysa (tucatinib) which can elevate voriconazole levels and potentially cause voriconazole toxicity including QT prolongation.  We checjked her voriconazole levels and they have been in the therapeutic range recently  EKG showed:  her EKG showed a QTC of 448 with a QTC of 465 ms last EKG that I could find in the computer that I could actually read was in 2019 in which she had a QTC of 442.   She did tell us that she is coughing less since being on voriconazole.   .  She had MRI of the brain done this February 2022 showed decrease in size of 3 lesions but multiple new lesions.  Her regimen was Xeloda Herceptin and Tucatinib  I last saw her she was developing a rash on her hands which worsened as well as on her lips.  Ultimately her oncologist Dr. Lindi Adie stopped her Xeloda and Tucatinib and she is on Herceptin alone.   In the interim she then had CT chest abdomen pelvis.  From an infectious disease standpoint there is increase in her cavitation which is not altogether surprising and there were areas of increased nodularity and specially in the left lower lung.  She was bothered by this and sought second opinion at [facility] and see by Dr. Gaylyn Lambert who carefully reviewed her case and felt that  there was no need for further aggressive inventions to work this up nor need to change antifungal therapy.  She also had an MRI of the brain that showed new lesions.  She was seen by Stereotactic Radiosurgery to 3 lesions on March 17 2021.  She started new chemotherapy in the form of neratinib whose drug concentrations are increased by voriconazole.  However Dr Lindi Adie started at a lower dose which they have tried to escalate.  She told me she took 3 pills without  difficulty but when she went to 5 pills she had severe diarrhea and had to be given IV fluids for rehydration.    She is now off of neratinib but still on herceptin  He did again have stereotactic surgery for brain metastasis with Dr. Reatha Armour in December 2000 2022  She had a CT of the chest performed in January 2023:   This did not show PE as it was a CT angiogram did show increased opacity in left lung base with some minimal effusion with multiple serpiginous cystic lesions that were stable.  Also extensive emphysematous changes seen.   Shortly after getting her CT scan she developed neck pain chest pain which she seemed to be pleurisy to her and was ultimately mid to the hospital and treated for pneumonia with IV antibiotics followed by doxycycline and cefdinir.  She does not tolerate these antibiotics particularly doxycycline.  She saw my partner Dr. West Bali in clinic while she is off antibiotics and she gave Melissa [name] Augmentin to finish a course of antibiotics.  Since then the patient has returned to her chronic cough with some blood-tinged sputum which she has on a daily basis.  She asked me to fill out forms for her inhaled corticosteroid which I did during today's visit.  She was applying for patient assistance for this.      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   Asthma    Breast cancer ([place])    Bronchiectasis ([place])    Bronchiolitis    Cancer ([place])    breast cancer 2014   Cancer, metastatic to liver ([place])    6073   Complication of anesthesia    bp dropped + desat    COPD (chronic obstructive pulmonary disease) ([facility])    Dyspnea    DOE   GERD (gastroesophageal reflux disease)    H/O coccidioidomycosis    was reason for lung lobectomy   Headache(784.0)    due to eye strain or not eating   History of anemia    no current problem   History of asthma    as a child   History of breast cancer 2014    left   History of chemotherapy    finished 07/17/2013   History of hiatal hernia    AGE 60   Hx of radiation therapy 03/25/13-05/06/13   left breast 5000 cGy/25 sessions, left breast boost 1000 cGy/5 sessions   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   Pneumonia    LAST FLARE UP 01/2018   Rash 02/22/2021   Runny nose 07/30/2013   clear drainage   Smoker 05/24/2021   Wears dentures    upper    Past Surgical History:  Procedure Laterality Date   APPLICATION OF CRANIAL NAVIGATION N/A 08/04/2017   Procedure: APPLICATION OF CRANIAL NAVIGATION;  Surgeon: [name], [name], MD;  Location: [place];  Service: Neurosurgery;  Laterality: N/A;   AXILLARY  LYMPH NODE DISSECTION Left 02/08/2013   Procedure: LEFT AXILLARY DISSECTION;  Surgeon: Edward Jolly, MD;  Location: [place];  Service: General;  Laterality: Left;   BREAST CYST EXCISION Right 2006   BREAST LUMPECTOMY Left 2014   BREAST LUMPECTOMY WITH NEEDLE LOCALIZATION AND AXILLARY SENTINEL LYMPH NODE BX Left 12/31/2012   Procedure: NEEDLE LOCALIZATION LEFT BREAST LUMPECTOMY AND LEFT AXILLARY SENTENIAL LYMPH NODE BX;  Surgeon: Edward Jolly, MD;  Location: [place];  Service: General;  Laterality: Left;   BRONCHIAL BRUSHINGS  03/17/2020   Procedure: BRONCHIAL BRUSHINGS;  Surgeon: Garner Nash, DO;  Location: [facility];  Service: Pulmonary;;   BRONCHIAL WASHINGS  03/17/2020   Procedure: BRONCHIAL WASHINGS;  Surgeon: Garner Nash, DO;  Location: [place];  Service: Pulmonary;;   CESAREAN SECTION  1995/1996   CRANIOTOMY Right 08/04/2017   Procedure: Right Frontal craniotomy for resection of tumor with stereotactic navigation;  Surgeon: [name], [name], MD;  Location: [place];  Service: Neurosurgery;  Laterality: Right;  Right Frontal craniotomy for resection of tumor with stereotactic navigation   IR CV LINE INJECTION  02/19/2021   IR IMAGING GUIDED PORT INSERTION  05/31/2019   LUNG LOBECTOMY Left 05/1996    upper lobe - due to [facility] Fever   PORT-A-CATH REMOVAL Right 08/02/2013   Procedure: REMOVAL PORT-A-CATH;  Surgeon: Edward Jolly, MD;  Location: [place];  Service: General;  Laterality: Right;   PORTACATH PLACEMENT  07/02/2012   Procedure: INSERTION PORT-A-CATH;  Surgeon: Edward Jolly, MD;  Location: [place];  Service: General;  Laterality: N/A;  right   VIDEO BRONCHOSCOPY N/A 03/17/2020   Procedure: VIDEO BRONCHOSCOPY;  Surgeon: Garner Nash, DO;  Location: [place];  Service: Pulmonary;  Laterality: N/A;   VIDEO BRONCHOSCOPY WITH ENDOBRONCHIAL ULTRASOUND N/A 05/23/2018   Procedure: VIDEO BRONCHOSCOPY WITH ENDOBRONCHIAL ULTRASOUND;  Surgeon: Garner Nash, DO;  Location: [facility];  Service: Thoracic;  Laterality: N/A;    Family History  Problem Relation Age of Onset   Emphysema Mother        was a smoker   Heart disease Mother    Melanoma Mother        dx in her 74s   Breast cancer Mother 4   Asthma Brother    Breast cancer Cousin        mother's maternal cousin; dx in her 34s      Social History   Socioeconomic History   Marital status: Married    Spouse name: Not on file   Number of children: 2   Years of education: Not on file   Highest education level: Not on file  Occupational History   Occupation: Secondary school teacher  Tobacco Use   Smoking status: Every Day    Packs/day: 0.30    Years: 38.00    Pack years: 11.40    Types: Cigarettes   Smokeless tobacco: Never   Tobacco comments:    10 cigarettes smoked daily 10/19/20 ARJ   Vaping Use   Vaping Use: Never used  Substance and Sexual Activity   Alcohol use: No   Drug use: No   Sexual activity: Not Currently  Other Topics Concern   Not on file  Social History Narrative   Not on file   Social Determinants of Health   Financial Resource Strain: Not on file  Food Insecurity: Not on file  Transportation Needs: Not on file  Physical Activity: Not on  file   Stress: Not on file  Social Connections: Not on file    Allergies  Allergen Reactions   Aspirin Anaphylaxis and Shortness Of Breath    THROAT CLOSES   Protonix [Pantoprazole] Nausea Only and Other (See Comments)    Also caused a "film in the mouth" and caused chest pressure   Doxycycline Other (See Comments)    Hallucinations, headaches   Promethazine-Codeine Cough    Worsening cough and insomnia    Iodinated Contrast Media Rash    Patient allergic to contrast used in radiation oncology for CT simulation      Current Outpatient Medications:    acetaminophen (TYLENOL) 325 MG tablet, Take 2 tablets (650 mg total) by mouth every 6 (six) hours as needed for mild pain (or Fever >/= 101)., Disp: 12 tablet, Rfl: 0   albuterol (VENTOLIN HFA) 108 (90 Base) MCG/ACT inhaler, Inhale 2 puffs into the lungs every 6 (six) hours as needed for wheezing or shortness of breath (cough)., Disp: 18 g, Rfl: 5   budesonide-formoterol (SYMBICORT) 160-4.5 MCG/ACT inhaler, Inhale 2 puffs into the lungs 2 (two) times daily., Disp: 10.2 g, Rfl: 3   chlorpheniramine-HYDROcodone 10-8 MG/5ML, Take 5 mLs by mouth 2 (two) times daily., Disp: 115 mL, Rfl: 0   lidocaine-prilocaine (EMLA) cream, APPLY EXTERNALLY TO THE AFFECTED AREA 1 TIME, Disp: 30 g, Rfl: 3   Multiple Vitamins-Minerals (EMERGEN-C VITAMIN C PO), Take by mouth., Disp: , Rfl:    Vitamin E 200 units TABS, Take by mouth., Disp: , Rfl:    voriconazole (VFEND) 200 MG tablet, Take 1 tablet (200 mg total) by mouth 2 (two) times daily., Disp: 60 tablet, Rfl: 11   amoxicillin-clavulanate (AUGMENTIN) 875-125 MG tablet, Take 1 tablet by mouth 2 (two) times daily. (Patient not taking: Reported on 08/18/2021), Disp: 14 tablet, Rfl: 0 No current facility-administered medications for this visit.  Facility-Administered Medications Ordered in Other Visits:    alteplase (CATHFLO ACTIVASE) injection 2 mg, 2 mg, Intracatheter, Once PRN, Nicholas Lose, MD   sodium chloride  flush (NS) 0.9 % injection 10 mL, 10 mL, Intracatheter, PRN, Nicholas Lose, MD, 10 mL at 07/06/21 1428   Review of Systems  Constitutional:  Negative for activity change, appetite change, chills, diaphoresis, fatigue, fever and unexpected weight change.  HENT:  Negative for congestion, rhinorrhea, sinus pressure, sneezing, sore throat and trouble swallowing.   Eyes:  Negative for photophobia and visual disturbance.  Respiratory:  Positive for cough. Negative for chest tightness, shortness of breath, wheezing and stridor.   Cardiovascular:  Negative for chest pain, palpitations and leg swelling.  Gastrointestinal:  Negative for abdominal distention, abdominal pain, anal bleeding, blood in stool, constipation, diarrhea, nausea and vomiting.  Genitourinary:  Negative for difficulty urinating, dysuria, flank pain and hematuria.  Musculoskeletal:  Negative for arthralgias, back pain, gait problem, joint swelling and myalgias.  Skin:  Negative for color change, pallor, rash and wound.  Neurological:  Negative for dizziness, tremors, weakness and light-headedness.  Hematological:  Negative for adenopathy. Does not bruise/bleed easily.  Psychiatric/Behavioral:  Negative for agitation, behavioral problems, confusion, decreased concentration, dysphoric mood and sleep disturbance.       Objective:   Physical Exam Constitutional:      General: She is not in acute distress.    Appearance: She is underweight. She is not diaphoretic.  HENT:     Head: Normocephalic and atraumatic.     Right Ear: External ear normal.     Left Ear: External ear normal.  Nose: Nose normal.     Mouth/Throat:     Pharynx: No oropharyngeal exudate.  Eyes:     General: No scleral icterus.       Right eye: No discharge.        Left eye: No discharge.     Extraocular Movements: Extraocular movements intact.     Conjunctiva/sclera: Conjunctivae normal.  Cardiovascular:     Rate and Rhythm: Normal rate and regular  rhythm.     Heart sounds: No murmur heard.   No friction rub. No gallop.  Pulmonary:     Effort: Prolonged expiration present. No respiratory distress.     Breath sounds: No stridor. Wheezing present. No rales.  Abdominal:     Palpations: Abdomen is soft.  Musculoskeletal:        General: No tenderness. Normal range of motion.     Cervical back: Normal range of motion and neck supple.  Lymphadenopathy:     Cervical: No cervical adenopathy.  Skin:    General: Skin is warm and dry.     Coloration: Skin is not jaundiced or pale.     Findings: No erythema, lesion or rash.  Neurological:     General: No focal deficit present.     Mental Status: She is alert and oriented to person, place, and time.     Coordination: Coordination normal.  Psychiatric:        Mood and Affect: Mood normal.        Behavior: Behavior normal.        Thought Content: Thought content normal.        Judgment: Judgment normal.         Assessment & Plan:   Mycetoma:  Continue voriconazole CMP performed recently within normal.  CT of the chest reviewed from January 2023.  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   Metastatic breast cancer with mets to the brain status post stereotactic radiation in December.  PET scan done recently shows no metastatic disease elsewhere in particular the liver where there was apparent scar. She tells me that she is going to want to just stay on Herceptin but she is going to be meeting with Dr. Lindi Adie in upcoming weeks  Recent bacterial pneumonia has resolved  Emphysema but also with some reactive airway disease: If it pulled out and application for patient assistance for her Symbicort.  Excellent counseling recommended Prevnar 20 which she received today.  Smoking: Counseled on benefits of smoking cessation.  I spent 54 minutes with the patient including than 50% of the time  in face to face counseling of the patient regarding her aspergillosis coccidiomycosis history her metastatic breast cancer COPD personally reviewing CT angiogram of the lungs done in January 2023 as well as PET scan performed on August 12, 2021 along with review of medical records in preparation for the visit and during the visit, also in filling out assistance forms for her and in coordination of her care.

## 2021-08-19 ENCOUNTER — Other Ambulatory Visit: Payer: Self-pay | Admitting: Radiation Therapy

## 2021-08-19 DIAGNOSIS — C7931 Secondary malignant neoplasm of brain: Secondary | ICD-10-CM

## 2021-08-19 NOTE — Addendum Note (Signed)
Encounter addended by: Freeman Caldron, PA-C on: 08/19/2021 1:40 PM  Actions taken: Clinical Note Signed

## 2021-08-23 ENCOUNTER — Telehealth: Payer: Self-pay | Admitting: Pulmonary Disease

## 2021-08-23 ENCOUNTER — Other Ambulatory Visit (HOSPITAL_COMMUNITY): Payer: Self-pay

## 2021-08-23 ENCOUNTER — Other Ambulatory Visit: Payer: Self-pay

## 2021-08-23 MED ORDER — BUDESONIDE-FORMOTEROL FUMARATE 160-4.5 MCG/ACT IN AERO
2.0000 | INHALATION_SPRAY | Freq: Two times a day (BID) | RESPIRATORY_TRACT | 11 refills | Status: DC
  Filled 2021-08-23: qty 10.2, 30d supply, fill #0

## 2021-08-23 NOTE — Telephone Encounter (Signed)
Pharmacy team, can you guys help with this? Thanks

## 2021-08-24 ENCOUNTER — Inpatient Hospital Stay: Payer: 59

## 2021-08-24 ENCOUNTER — Other Ambulatory Visit: Payer: 59

## 2021-08-24 ENCOUNTER — Other Ambulatory Visit (HOSPITAL_COMMUNITY): Payer: Self-pay

## 2021-08-24 ENCOUNTER — Other Ambulatory Visit: Payer: Self-pay | Admitting: Hematology and Oncology

## 2021-08-24 ENCOUNTER — Other Ambulatory Visit: Payer: Self-pay

## 2021-08-24 VITALS — BP 102/65 | HR 77 | Temp 98.4°F | Resp 18 | Wt 94.0 lb

## 2021-08-24 DIAGNOSIS — C50212 Malignant neoplasm of upper-inner quadrant of left female breast: Secondary | ICD-10-CM

## 2021-08-24 DIAGNOSIS — T451X5A Adverse effect of antineoplastic and immunosuppressive drugs, initial encounter: Secondary | ICD-10-CM

## 2021-08-24 DIAGNOSIS — Z171 Estrogen receptor negative status [ER-]: Secondary | ICD-10-CM

## 2021-08-24 DIAGNOSIS — K521 Toxic gastroenteritis and colitis: Secondary | ICD-10-CM

## 2021-08-24 DIAGNOSIS — Z170 Estrogen receptor positive status [ER+]: Secondary | ICD-10-CM

## 2021-08-24 DIAGNOSIS — Z5112 Encounter for antineoplastic immunotherapy: Secondary | ICD-10-CM | POA: Diagnosis not present

## 2021-08-24 DIAGNOSIS — Z95828 Presence of other vascular implants and grafts: Secondary | ICD-10-CM

## 2021-08-24 LAB — CBC WITH DIFFERENTIAL (CANCER CENTER ONLY)
Abs Immature Granulocytes: 0.04 10*3/uL (ref 0.00–0.07)
Basophils Absolute: 0.1 10*3/uL (ref 0.0–0.1)
Basophils Relative: 1 %
Eosinophils Absolute: 0.7 10*3/uL — ABNORMAL HIGH (ref 0.0–0.5)
Eosinophils Relative: 6 %
HCT: 31.4 % — ABNORMAL LOW (ref 36.0–46.0)
Hemoglobin: 10.3 g/dL — ABNORMAL LOW (ref 12.0–15.0)
Immature Granulocytes: 0 %
Lymphocytes Relative: 19 %
Lymphs Abs: 2.4 10*3/uL (ref 0.7–4.0)
MCH: 27.7 pg (ref 26.0–34.0)
MCHC: 32.8 g/dL (ref 30.0–36.0)
MCV: 84.4 fL (ref 80.0–100.0)
Monocytes Absolute: 1 10*3/uL (ref 0.1–1.0)
Monocytes Relative: 8 %
Neutro Abs: 8.3 10*3/uL — ABNORMAL HIGH (ref 1.7–7.7)
Neutrophils Relative %: 66 %
Platelet Count: 339 10*3/uL (ref 150–400)
RBC: 3.72 MIL/uL — ABNORMAL LOW (ref 3.87–5.11)
RDW: 19.6 % — ABNORMAL HIGH (ref 11.5–15.5)
Smear Review: NORMAL
WBC Count: 12.5 10*3/uL — ABNORMAL HIGH (ref 4.0–10.5)
nRBC: 0 % (ref 0.0–0.2)

## 2021-08-24 LAB — LIPID PANEL
Cholesterol: 220 mg/dL — ABNORMAL HIGH (ref 0–200)
HDL: 63 mg/dL (ref >40)
LDL Cholesterol: 141 mg/dL — ABNORMAL HIGH (ref 0–99)
Total CHOL/HDL Ratio: 3.5 RATIO
Triglycerides: 82 mg/dL (ref <150)
VLDL: 16 mg/dL (ref 0–40)

## 2021-08-24 LAB — CMP (CANCER CENTER ONLY)
ALT: 14 U/L (ref 0–44)
AST: 17 U/L (ref 15–41)
Albumin: 3.9 g/dL (ref 3.5–5.0)
Alkaline Phosphatase: 83 U/L (ref 38–126)
Anion gap: 4 — ABNORMAL LOW (ref 5–15)
BUN: 14 mg/dL (ref 6–20)
CO2: 29 mmol/L (ref 22–32)
Calcium: 9.6 mg/dL (ref 8.9–10.3)
Chloride: 104 mmol/L (ref 98–111)
Creatinine: 0.8 mg/dL (ref 0.44–1.00)
GFR, Estimated: >60 mL/min (ref >60)
Glucose, Bld: 84 mg/dL (ref 70–99)
Potassium: 4.3 mmol/L (ref 3.5–5.1)
Sodium: 137 mmol/L (ref 135–145)
Total Bilirubin: 0.2 mg/dL — ABNORMAL LOW (ref 0.3–1.2)
Total Protein: 7.6 g/dL (ref 6.5–8.1)

## 2021-08-24 LAB — MAGNESIUM: Magnesium: 1.9 mg/dL (ref 1.7–2.4)

## 2021-08-24 MED ORDER — ACETAMINOPHEN 325 MG PO TABS
650.0000 mg | ORAL_TABLET | Freq: Once | ORAL | Status: AC
  Administered 2021-08-24: 650 mg via ORAL
  Filled 2021-08-24: qty 2

## 2021-08-24 MED ORDER — ALTEPLASE 2 MG IJ SOLR
2.0000 mg | Freq: Once | INTRAMUSCULAR | Status: AC | PRN
  Administered 2021-08-24: 2 mg
  Filled 2021-08-24: qty 2

## 2021-08-24 MED ORDER — SODIUM CHLORIDE 0.9 % IV SOLN: Freq: Once | INTRAVENOUS | Status: AC

## 2021-08-24 MED ORDER — TRASTUZUMAB-ANNS CHEMO 150 MG IV SOLR
6.0000 mg/kg | Freq: Once | INTRAVENOUS | Status: AC
  Administered 2021-08-24: 252 mg via INTRAVENOUS
  Filled 2021-08-24: qty 12

## 2021-08-24 MED ORDER — HEPARIN SOD (PORK) LOCK FLUSH 100 UNIT/ML IV SOLN
500.0000 [IU] | Freq: Once | INTRAVENOUS | Status: AC | PRN
  Administered 2021-08-24: 500 [IU]

## 2021-08-24 MED ORDER — DIPHENHYDRAMINE HCL 25 MG PO CAPS
25.0000 mg | ORAL_CAPSULE | Freq: Once | ORAL | Status: AC
  Administered 2021-08-24: 25 mg via ORAL
  Filled 2021-08-24: qty 1

## 2021-08-24 MED ORDER — SODIUM CHLORIDE 0.9% FLUSH
10.0000 mL | INTRAVENOUS | Status: DC | PRN
  Administered 2021-08-24: 10 mL

## 2021-08-24 NOTE — Telephone Encounter (Signed)
The other covered alternative via [employer] test claim returned copay is Advair-brand $65 and AirDuo $20

## 2021-08-24 NOTE — Progress Notes (Signed)
Unable to draw labs from patient port. Cathflo instilled at 925am. Labs will be drawn peripherally today.   Checked port for blood return at 9:40. Blood returned easy and 10 cc's wasted. Labs were drawn and patient was connected to normal saline.

## 2021-08-24 NOTE — Telephone Encounter (Signed)
Please advise 

## 2021-08-24 NOTE — Patient Instructions (Signed)
[facility]ICAL [facility]COLOG[facility]  Discharge Instructions: Thank you for choosing [facility] to provide your oncology and hematology care.   If you have a lab appointment with the Cancer Center, please go directly to the Cancer Center and check in at the registration area.   Wear comfortable clothing and clothing appropriate for easy access to any Portacath or PICC line.   We strive to give you quality time with your provider. You may need to reschedule your appointment if you arrive late (15 or more minutes).  Arriving late affects you and other patients whose appointments are after yours.  Also, if you miss three or more appointments without notifying the office, you may be dismissed from the clinic at the provider's discretion.      For prescription refill requests, have your pharmacy contact our office and allow 72 hours for refills to be completed.    Today you received the following chemotherapy and/or immunotherapy agents Trastuzumab-anns (Kanjinti)      To help prevent nausea and vomiting after your treatment, we encourage you to take your nausea medication as directed.  BELOW ARE SYMPTOMS THAT SHOULD BE REPORTED IMMEDIATELY: *FEVER GREATER THAN 100.4 F (38 C) OR HIGHER *CHILLS OR SWEATING *NAUSEA AND VOMITING THAT IS NOT CONTROLLED WITH YOUR NAUSEA MEDICATION *UNUSUAL SHORTNESS OF BREATH *UNUSUAL BRUISING OR BLEEDING *URINARY PROBLEMS (pain or burning when urinating, or frequent urination) *BOWEL PROBLEMS (unusual diarrhea, constipation, pain near the anus) TENDERNESS IN MOUTH AND THROAT WITH OR WITHOUT PRESENCE OF ULCERS (sore throat, sores in mouth, or a toothache) UNUSUAL RASH, SWELLING OR PAIN  UNUSUAL VAGINAL DISCHARGE OR ITCHING   Items with * indicate a potential emergency and should be followed up as soon as possible or go to the Emergency Department if any problems should occur.  Please show the CHEMOTHERAPY ALERT CARD or IMMUNOTHERAPY ALERT  CARD at check-in to the Emergency Department and triage nurse.  Should you have questions after your visit or need to cancel or reschedule your appointment, please contact [facility]  Dept: [phone]  and follow the prompts.  Office hours are 8:00 a.m. to 4:30 p.m. Monday - Friday. Please note that voicemails left after 4:00 p.m. may not be returned until the following business day.  We are closed weekends and major holidays. You have access to a nurse at all times for urgent questions. Please call the main number to the clinic Dept: [phone] and follow the prompts.   For any non-urgent questions, you may also contact your provider using MyChart. We now offer e-Visits for anyone 18 and older to request care online for non-urgent symptoms. For details visit [email or web address].   Also download the MyChart app! Go to the app store, search "MyChart", open the app, select [facility], and log in with your MyChart username and password.  Due to Covid, a mask is required upon entering the hospital/clinic. If you do not have a mask, one will be given to you upon arrival. For doctor visits, patients may have 1 support person aged 18 or older with them. For treatment visits, patients cannot have anyone with them due to current Covid guidelines and our immunocompromised population.   

## 2021-08-25 ENCOUNTER — Other Ambulatory Visit: Payer: Self-pay

## 2021-08-25 MED ORDER — AIRDUO DIGIHALER 113-14 MCG/ACT IN AEPB: 2.0000 | INHALATION_SPRAY | Freq: Two times a day (BID) | RESPIRATORY_TRACT | 11 refills | Status: DC

## 2021-08-25 NOTE — Telephone Encounter (Signed)
Called and spoke to patient and made her aware of inhaler change and test claim for Airduo cost with current insurance. Also instructed patient to use inhaler 2 puffs twice a day. She voiced understanding. Verified pharmacy and sent in refill. Advised patient to call the office back for any further questions or needs. Nothing further needed.

## 2021-08-27 ENCOUNTER — Telehealth: Payer: Self-pay | Admitting: Pulmonary Disease

## 2021-08-27 NOTE — Telephone Encounter (Signed)
Called and spoke with Melissa [name]. She stated that they received the AirDuo RX and it says 2 puffs twice daily. Per pharmacist, it is supposed to be 1 puff twice daily. She wanted to make sure she has the correct instructions on the inhaler.   Dr. Valeta Harms, can you please advise? Thanks!

## 2021-08-30 MED ORDER — AIRDUO DIGIHALER 113-14 MCG/ACT IN AEPB: 1.0000 | INHALATION_SPRAY | Freq: Two times a day (BID) | RESPIRATORY_TRACT | 4 refills | Status: DC

## 2021-08-30 NOTE — Telephone Encounter (Signed)
New RX has been sent to pharmacy.

## 2021-09-10 ENCOUNTER — Other Ambulatory Visit: Payer: Self-pay | Admitting: Hematology and Oncology

## 2021-09-13 ENCOUNTER — Other Ambulatory Visit: Payer: Self-pay | Admitting: *Deleted

## 2021-09-13 ENCOUNTER — Other Ambulatory Visit (HOSPITAL_COMMUNITY): Payer: Self-pay

## 2021-09-13 DIAGNOSIS — C50212 Malignant neoplasm of upper-inner quadrant of left female breast: Secondary | ICD-10-CM

## 2021-09-13 DIAGNOSIS — Z171 Estrogen receptor negative status [ER-]: Secondary | ICD-10-CM

## 2021-09-13 MED ORDER — HYDROCOD POLI-CHLORPHE POLI ER 10-8 MG/5ML PO SUER
5.0000 mL | Freq: Two times a day (BID) | ORAL | 0 refills | Status: DC
  Filled 2021-09-13: qty 115, 12d supply, fill #0

## 2021-09-13 NOTE — Progress Notes (Signed)
Patient Care Team: Patient, [name] as PCP - General (General Practice) [name], MD as Referring Physician (Emergency Medicine)  DIAGNOSIS:    ICD-10-CM   1. Malignant neoplasm of upper-inner quadrant of left breast in female, estrogen receptor positive ([place])  C50.212    Z17.0       SUMMARY OF ONCOLOGIC HISTORY: Oncology History  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 Ki-67 was 70% HER-2 was amplified with a ratio 2.91   07/12/2012 - 07/17/2013 Neo-Adjuvant Chemotherapy   TCH 6 followed by Herceptin maintenance   12/11/2012 Surgery   Left breast lumpectomy: 1.8 cm tumor 1 positive sentinel node, axillary lymph node dissection 02/08/2013 showed 0/13 lymph nodes   03/25/2013 - 05/06/2013 Radiation Therapy   Adjuvant radiation therapy   06/05/2013 - 07/20/2017 Anti-estrogen oral therapy   Tamoxifen 20 mg daily   07/27/2017 Relapse/Recurrence   MRI Brain: 3.4 x 2.9 x 2.9 cm RIGHT frontal lobe mass with imaging characteristics of solitary metastasis. Extensive vasogenic edema resulting in 9 mm RIGHT to LEFT midline shift. Equivocal very early LEFT ventricle entrapment.    08/04/2017 Surgery   Rt frontal brain resection: Poorly differentiated tumor IHC suggests breast primary ER and PR Positive   08/25/2017 - 09/04/2017 Radiation Therapy   Stereotactic radiation   09/18/2017 -  Anti-estrogen oral therapy   Lapatinib with letrozole   12/18/2017 - 12/19/2017 Radiation Therapy   New right parietal lobe metastases status post [facility]   08/27/2018 - 08/27/2020 Radiation Therapy   SRS to new brain metastases   05/09/2019 Relapse/Recurrence   Interval increase in size of the enhancing nodular left internal mammary soft tissue 2.8 cm.   Redemonstrated enlarged supraclavicular, lower cervical and lower posterior cervical nodes unchanged.  Interval increase in the bony erosion of the posterior and lateral left third rib, increasing soft tissue lesion eroding the left sternal body 3.1 cm was 2.5 cm.  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 with above-mentioned history of metastatic breast cancer with brain metastasis s/p resection who is currently on treatment with Xeloda and Herceptin. She presents to the clinic today for follow-up and treatment.   ALLERGIES:  is allergic to aspirin, protonix [pantoprazole], doxycycline, promethazine-codeine, and iodinated contrast media.  MEDICATIONS:  Current Outpatient Medications  Medication Sig Dispense Refill   acetaminophen (TYLENOL) 325 MG tablet Take 2 tablets (650 mg total) by mouth every 6 (six) hours as needed for mild pain (or Fever >/= 101). 12 tablet 0   albuterol (VENTOLIN HFA) 108 (90 Base) MCG/ACT inhaler Inhale 2 puffs into the lungs every 6 (six) hours as needed for wheezing or shortness of breath (cough). 18 g 5   chlorpheniramine-HYDROcodone 10-8 MG/5ML Take 5 mLs by mouth 2 times daily. 115 mL 0   Fluticasone-Salmeterol,sensor, (AIRDUO  DIGIHALER) 113-14 MCG/ACT AEPB Inhale 1 puff into the lungs 2 (two) times daily. 1 each 4   lidocaine-prilocaine (EMLA) cream APPLY EXTERNALLY TO THE AFFECTED AREA 1 TIME 30 g 3   Multiple Vitamins-Minerals (EMERGEN-C VITAMIN C PO) Take by mouth.     Vitamin E 200 units  TABS Take by mouth.     voriconazole (VFEND) 200 MG tablet Take 1 tablet (200 mg total) by mouth 2 (two) times daily. 60 tablet 11   No current facility-administered medications for this visit.   Facility-Administered Medications Ordered in Other Visits  Medication Dose Route Frequency Provider Last Rate Last Admin   alteplase (CATHFLO ACTIVASE) injection 2 mg  2 mg Intracatheter Once PRN Nicholas Lose, MD       sodium chloride flush (NS) 0.9 % injection 10 mL  10 mL Intracatheter PRN Nicholas Lose, MD   10 mL at 07/06/21 1428    PHYSICAL EXAMINATION: ECOG PERFORMANCE STATUS: 1 - Symptomatic but completely ambulatory  Vitals:   09/14/21 0830  BP: 118/64  Pulse: 90  Resp: 18  Temp: 97.9 F (36.6 C)  SpO2: 100%   Filed Weights   09/14/21 0830  Weight: 92 lb 8 oz (42 kg)      LABORATORY DATA:  I have reviewed the data as listed CMP Latest Ref Rng & Units 08/24/2021 08/16/2021 08/03/2021  Glucose 70 - 99 mg/dL 84 81 116(H)  BUN 6 - 20 mg/dL $Remove'14 15 16  'iDkzZLi$ Creatinine 0.44 - 1.00 mg/dL 0.80 0.87 0.83  Sodium 135 - 145 mmol/L 137 139 137  Potassium 3.5 - 5.1 mmol/L 4.3 4.2 4.3  Chloride 98 - 111 mmol/L 104 105 104  CO2 22 - 32 mmol/L $RemoveB'29 27 27  'qcsQArNi$ Calcium 8.9 - 10.3 mg/dL 9.6 9.7 9.3  Total Protein 6.5 - 8.1 g/dL 7.6 8.0 7.4  Total Bilirubin 0.3 - 1.2 mg/dL 0.2(L) 0.2(L) 0.2(L)  Alkaline Phos 38 - 126 U/L 83 84 89  AST 15 - 41 U/L $Remo'17 18 15  'DNSmC$ ALT 0 - 44 U/L $Remo'14 12 12    'DYaKs$ Lab Results  Component Value Date   WBC 12.0 (H) 09/14/2021   HGB 10.7 (L) 09/14/2021   HCT 33.5 (L) 09/14/2021   MCV 85.7 09/14/2021   PLT 425 (H) 09/14/2021   NEUTROABS 7.9 (H) 09/14/2021    ASSESSMENT & PLAN:  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 ---s/p Lumpectomy/ /Initial Ca 1997, Brain Mets 2019 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  [place] brain: 08/25/2017-09/04/2017 2. Anti Her 2 therapy with Lapatinib started 09/17/2017-05/14/2019: Stopped for progression 3.  I discontinued tamoxifen and started her on letrozole 2.5 mg daily.  05/14/2019 stopped for progression 4.  Stereotactic radiosurgery 12/19/2017 to the new right parietal lobe metastases. 5.  Kadcyla: Received 16 cycles discontinued 05/05/2020 6.  Tucatinib Xeloda: Discontinued because of hand-foot syndrome 06/11/2020-12/15/2020 7. 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  The spot on the liver noted on the CT scan did not have any activity on the PET scan.  Postinflammatory changes in the lungs chronic sequelae of atypical infection   Based on this we decided to continue with Herceptin maintenance alone.  The only other option would be Margetuximab. Return to  clinic every 3 weeks for Herceptin every 6 weeks to follow-up with me with labs through the port.    No orders of the defined types were placed in this encounter.  The patient has a good understanding of the overall plan. she agrees with it. she will call with any problems that may develop before the next visit here.  Total time spent: 30 mins including face to face time and time spent for planning, charting and coordination of care  Rulon Eisenmenger, MD, MPH 09/14/2021  I, Thana Ates, am acting as scribe for Dr. Nicholas Lose.  I have reviewed the above documentation for accuracy and completeness, and I agree with the above.

## 2021-09-14 ENCOUNTER — Other Ambulatory Visit: Payer: Self-pay

## 2021-09-14 ENCOUNTER — Inpatient Hospital Stay: Payer: 59 | Attending: Medical

## 2021-09-14 ENCOUNTER — Inpatient Hospital Stay: Payer: 59 | Admitting: Nutrition

## 2021-09-14 ENCOUNTER — Inpatient Hospital Stay: Payer: 59 | Admitting: Hematology and Oncology

## 2021-09-14 ENCOUNTER — Inpatient Hospital Stay: Payer: 59

## 2021-09-14 DIAGNOSIS — Z5112 Encounter for antineoplastic immunotherapy: Secondary | ICD-10-CM | POA: Insufficient documentation

## 2021-09-14 DIAGNOSIS — Z452 Encounter for adjustment and management of vascular access device: Secondary | ICD-10-CM | POA: Insufficient documentation

## 2021-09-14 DIAGNOSIS — C50212 Malignant neoplasm of upper-inner quadrant of left female breast: Secondary | ICD-10-CM | POA: Insufficient documentation

## 2021-09-14 DIAGNOSIS — C7931 Secondary malignant neoplasm of brain: Secondary | ICD-10-CM | POA: Insufficient documentation

## 2021-09-14 DIAGNOSIS — Z170 Estrogen receptor positive status [ER+]: Secondary | ICD-10-CM

## 2021-09-14 DIAGNOSIS — C787 Secondary malignant neoplasm of liver and intrahepatic bile duct: Secondary | ICD-10-CM | POA: Diagnosis present

## 2021-09-14 DIAGNOSIS — Z171 Estrogen receptor negative status [ER-]: Secondary | ICD-10-CM

## 2021-09-14 DIAGNOSIS — Z95828 Presence of other vascular implants and grafts: Secondary | ICD-10-CM

## 2021-09-14 DIAGNOSIS — C7951 Secondary malignant neoplasm of bone: Secondary | ICD-10-CM | POA: Diagnosis not present

## 2021-09-14 LAB — CMP (CANCER CENTER ONLY)
ALT: 16 U/L (ref 0–44)
AST: 19 U/L (ref 15–41)
Albumin: 3.9 g/dL (ref 3.5–5.0)
Alkaline Phosphatase: 82 U/L (ref 38–126)
Anion gap: 7 (ref 5–15)
BUN: 13 mg/dL (ref 6–20)
CO2: 28 mmol/L (ref 22–32)
Calcium: 9.9 mg/dL (ref 8.9–10.3)
Chloride: 103 mmol/L (ref 98–111)
Creatinine: 0.82 mg/dL (ref 0.44–1.00)
GFR, Estimated: >60 mL/min (ref >60)
Glucose, Bld: 75 mg/dL (ref 70–99)
Potassium: 3.8 mmol/L (ref 3.5–5.1)
Sodium: 138 mmol/L (ref 135–145)
Total Bilirubin: 0.2 mg/dL — ABNORMAL LOW (ref 0.3–1.2)
Total Protein: 8.2 g/dL — ABNORMAL HIGH (ref 6.5–8.1)

## 2021-09-14 LAB — CBC WITH DIFFERENTIAL (CANCER CENTER ONLY)
Abs Immature Granulocytes: 0.03 10*3/uL (ref 0.00–0.07)
Basophils Absolute: 0.1 10*3/uL (ref 0.0–0.1)
Basophils Relative: 1 %
Eosinophils Absolute: 0.8 10*3/uL — ABNORMAL HIGH (ref 0.0–0.5)
Eosinophils Relative: 7 %
HCT: 33.5 % — ABNORMAL LOW (ref 36.0–46.0)
Hemoglobin: 10.7 g/dL — ABNORMAL LOW (ref 12.0–15.0)
Immature Granulocytes: 0 %
Lymphocytes Relative: 19 %
Lymphs Abs: 2.3 10*3/uL (ref 0.7–4.0)
MCH: 27.4 pg (ref 26.0–34.0)
MCHC: 31.9 g/dL (ref 30.0–36.0)
MCV: 85.7 fL (ref 80.0–100.0)
Monocytes Absolute: 0.9 10*3/uL (ref 0.1–1.0)
Monocytes Relative: 8 %
Neutro Abs: 7.9 10*3/uL — ABNORMAL HIGH (ref 1.7–7.7)
Neutrophils Relative %: 65 %
Platelet Count: 425 10*3/uL — ABNORMAL HIGH (ref 150–400)
RBC: 3.91 MIL/uL (ref 3.87–5.11)
RDW: 18.7 % — ABNORMAL HIGH (ref 11.5–15.5)
WBC Count: 12 10*3/uL — ABNORMAL HIGH (ref 4.0–10.5)
nRBC: 0 % (ref 0.0–0.2)

## 2021-09-14 LAB — MAGNESIUM: Magnesium: 1.9 mg/dL (ref 1.7–2.4)

## 2021-09-14 MED ORDER — ACETAMINOPHEN 325 MG PO TABS
650.0000 mg | ORAL_TABLET | Freq: Once | ORAL | Status: AC
  Administered 2021-09-14: 650 mg via ORAL
  Filled 2021-09-14: qty 2

## 2021-09-14 MED ORDER — DIPHENHYDRAMINE HCL 25 MG PO CAPS
25.0000 mg | ORAL_CAPSULE | Freq: Once | ORAL | Status: AC
  Administered 2021-09-14: 25 mg via ORAL
  Filled 2021-09-14: qty 1

## 2021-09-14 MED ORDER — TRASTUZUMAB-ANNS CHEMO 150 MG IV SOLR
6.0000 mg/kg | Freq: Once | INTRAVENOUS | Status: AC
  Administered 2021-09-14: 252 mg via INTRAVENOUS
  Filled 2021-09-14: qty 12

## 2021-09-14 MED ORDER — SODIUM CHLORIDE 0.9 % IV SOLN: Freq: Once | INTRAVENOUS | Status: AC

## 2021-09-14 MED ORDER — HEPARIN SOD (PORK) LOCK FLUSH 100 UNIT/ML IV SOLN
500.0000 [IU] | Freq: Once | INTRAVENOUS | Status: AC | PRN
  Administered 2021-09-14: 500 [IU]

## 2021-09-14 MED ORDER — ALTEPLASE 2 MG IJ SOLR
2.0000 mg | Freq: Once | INTRAMUSCULAR | Status: AC | PRN
  Administered 2021-09-14: 2 mg

## 2021-09-14 MED ORDER — SODIUM CHLORIDE 0.9% FLUSH
10.0000 mL | INTRAVENOUS | Status: DC | PRN
  Administered 2021-09-14: 10 mL

## 2021-09-14 NOTE — Progress Notes (Signed)
Nutrition follow up completed with patient during infusion for metastatic breast cancer. ? ?Weight improved and documented as 92 pounds, increased from 88.6 pounds on Jan 24. Patient reports appetite is improved and she is eating well. She drinks 1 carton of [employer] 1.4 supplement daily and has been trying to increase it to 2. She still drinks some milk with meals.She denies nutrition impact symptoms at this time. ? ?Nutrition Diagnosis: ?Inadequate oral intake improving. ? ?Intervention: ?Continue 1-2 cartons of [employer] 1.4 daily. ?Continue [employer] with meals. ?Continue 3 meals with snacks. ?Continue bowel regimen. ? ?Monitoring, Evaluation, Goals: ?Patient will continue to tolerate increased calories and protein to minimize weight loss. ? ?Next Visit: Tuesday, April 18 during infusion. ?

## 2021-09-14 NOTE — Assessment & Plan Note (Signed)
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  SRS brain: 08/25/2017-09/04/2017 °2. Anti Her 2 therapy with Lapatinib started 09/17/2017-05/14/2019: Stopped for progression °3.  I discontinued tamoxifen and started her on letrozole 2.5 mg daily.  05/14/2019 stopped for progression °4.  Stereotactic radiosurgery 12/19/2017 to the new right parietal lobe metastases. °5.  Kadcyla: Received 16 cycles discontinued 05/05/2020 °6.  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  AFB positive, being treated with voriconazole °MRI of the brain 08/13/20: Mixed treatment response with Dec in 3 lesions and multiple new lesions: Status post SBRT °Herceptin/Neratinib: unable to tolerate due to diarrhea and weight loss °------------------------------------------------------------------------------------------------------------- °Current treatment: Herceptin °PET CT scan 08/13/2021: No PET avid tumors in the chest abdomen and pelvis.  The spot on the liver noted on  the CT scan did not have any activity on the PET scan.  Postinflammatory changes in the lungs chronic sequelae of atypical infection ° °Based on this we decided to continue with Herceptin maintenance alone.  The only other option would be Margetuximab. °

## 2021-09-15 ENCOUNTER — Telehealth: Payer: Self-pay | Admitting: Hematology and Oncology

## 2021-09-15 NOTE — Telephone Encounter (Signed)
Called patient to inform her of the changes made to her upcoming appointments. Patient is aware of the changes. ?

## 2021-09-24 ENCOUNTER — Other Ambulatory Visit (HOSPITAL_COMMUNITY): Payer: Self-pay

## 2021-09-27 ENCOUNTER — Encounter: Payer: Self-pay | Admitting: Hematology and Oncology

## 2021-09-27 ENCOUNTER — Other Ambulatory Visit (HOSPITAL_COMMUNITY): Payer: Self-pay

## 2021-09-28 ENCOUNTER — Other Ambulatory Visit (HOSPITAL_COMMUNITY): Payer: Self-pay

## 2021-09-29 ENCOUNTER — Other Ambulatory Visit (HOSPITAL_COMMUNITY): Payer: Self-pay

## 2021-09-29 ENCOUNTER — Other Ambulatory Visit: Payer: Self-pay

## 2021-09-29 ENCOUNTER — Ambulatory Visit
Admission: RE | Admit: 2021-09-29 | Discharge: 2021-09-29 | Disposition: A | Discharge status: Home / Self Care | Payer: 59 | Source: Ambulatory Visit | Attending: Radiation Oncology | Admitting: Radiation Oncology

## 2021-09-29 ENCOUNTER — Other Ambulatory Visit: Payer: Self-pay | Admitting: Radiation Therapy

## 2021-09-29 ENCOUNTER — Encounter: Payer: Self-pay | Admitting: Hematology and Oncology

## 2021-09-29 DIAGNOSIS — C7931 Secondary malignant neoplasm of brain: Secondary | ICD-10-CM

## 2021-09-29 MED ORDER — HEPARIN SOD (PORK) LOCK FLUSH 100 UNIT/ML IV SOLN
500.0000 [IU] | Freq: Once | INTRAVENOUS | Status: AC
  Administered 2021-09-29: 500 [IU] via INTRAVENOUS

## 2021-09-29 MED ORDER — GADOBENATE DIMEGLUMINE 529 MG/ML IV SOLN
8.0000 mL | Freq: Once | INTRAVENOUS | Status: AC | PRN
  Administered 2021-09-29: 8 mL via INTRAVENOUS

## 2021-09-29 MED ORDER — SODIUM CHLORIDE 0.9% FLUSH
10.0000 mL | INTRAVENOUS | Status: DC | PRN
  Administered 2021-09-29: 10 mL via INTRAVENOUS

## 2021-09-29 NOTE — Progress Notes (Signed)
Order entered for port access, de-access and flush at the time of her brain MRI at [place].  ? ?Mont Dutton R.T.(R)(T) ?Radiation Special Procedures Navigator  ?

## 2021-10-01 ENCOUNTER — Other Ambulatory Visit: Payer: 59

## 2021-10-04 ENCOUNTER — Telehealth: Payer: Self-pay | Admitting: Radiation Therapy

## 2021-10-04 ENCOUNTER — Encounter: Payer: Self-pay | Admitting: Radiation Oncology

## 2021-10-04 ENCOUNTER — Inpatient Hospital Stay: Payer: 59

## 2021-10-04 NOTE — Telephone Encounter (Signed)
Called Melissa [name] to share the recommendations from conference this morning. She had already seen the Brain MRI results in [name] , and was happy to hear that the fusion confirmed only one of the targets are new, the 47m Rt Pons lesion. KMontserratprefers to come in person to see the MRI images and discuss treatment/ sign consent. I have rescheduled the telephone visit with Melissa [name] to an in-person visit on Thursday, 3/30 [name]  ? ?We have reviewed the information about the new and enlarging targets over the phone. She understands that the three enlarging targets have been treated and the change is more likely to be from treatment effect than local recurrence. Melissa [name] will be able to show the MRI images to her in person during her visit on 3/30.  ? ?SMont DuttonR.T.(R)(T) ?Radiation Special Procedures Navigator ? ? ?

## 2021-10-04 NOTE — Progress Notes (Signed)
?  Radiation Oncology         [phone] ?________________________________ ? ?Name: Melissa [name] MRN: [id number]  ?Date: 10/04/2021  DOB: 1965/08/04 ? ?Chart Note: ? ?We presented this patient in our brain tumor conference this morning and reviewed her previous and current brain MRIs.  We also fused her current brain MRI with contrast with a composite radiation distribution representing all of her previous 8 courses of stereotactic radiosurgery.  Then, we reviewed the 5 lesions with 2 being described as new and 3 being described as larger.  Ultimately, it only appears that one 3 mm enhancing lesion on the right side of the pons with surrounding edema represents a true new brain metastasis.  The punctate lesion in the cerebellar vermis was previously treated with stereotactic radiosurgery.  We confirmed that the 3 larger lesions have all been treated with stereotactic radiosurgery within the past 2 years suggesting that the current enlargement may represent an adverse radiation effect such as radiation necrosis more likely than local recurrence.  Based upon my review today, I would recommend salvage stereotactic radiosurgery to the right pontine brain metastasis, especially given its critical location in the brain, followed by continued MRI surveillance of the other treated and untreated portions of the brain. She is scheduled for follow-up 3/29 with Ashlyn Bruning to review findings/recommendation/plan. ? ?Below is a representative image of what will be GTV 19 Rt Pons 3 mm ? ? ? ?________________________________ ? ?Sheral Apley Tammi Klippel, M.D. ? ? ? ?

## 2021-10-05 ENCOUNTER — Inpatient Hospital Stay: Payer: 59

## 2021-10-05 ENCOUNTER — Other Ambulatory Visit: Payer: 59

## 2021-10-05 ENCOUNTER — Other Ambulatory Visit: Payer: Self-pay

## 2021-10-05 VITALS — BP 126/84 | HR 98 | Temp 98.2°F | Resp 20 | Wt 94.5 lb

## 2021-10-05 DIAGNOSIS — Z5112 Encounter for antineoplastic immunotherapy: Secondary | ICD-10-CM | POA: Diagnosis not present

## 2021-10-05 DIAGNOSIS — C50212 Malignant neoplasm of upper-inner quadrant of left female breast: Secondary | ICD-10-CM

## 2021-10-05 MED ORDER — HEPARIN SOD (PORK) LOCK FLUSH 100 UNIT/ML IV SOLN
500.0000 [IU] | Freq: Once | INTRAVENOUS | Status: AC | PRN
  Administered 2021-10-05: 500 [IU]

## 2021-10-05 MED ORDER — DIPHENHYDRAMINE HCL 25 MG PO CAPS
25.0000 mg | ORAL_CAPSULE | Freq: Once | ORAL | Status: AC
  Administered 2021-10-05: 25 mg via ORAL
  Filled 2021-10-05: qty 1

## 2021-10-05 MED ORDER — TRASTUZUMAB-ANNS CHEMO 150 MG IV SOLR
6.0000 mg/kg | Freq: Once | INTRAVENOUS | Status: AC
  Administered 2021-10-05: 252 mg via INTRAVENOUS
  Filled 2021-10-05: qty 12

## 2021-10-05 MED ORDER — ACETAMINOPHEN 325 MG PO TABS
650.0000 mg | ORAL_TABLET | Freq: Once | ORAL | Status: AC
  Administered 2021-10-05: 650 mg via ORAL
  Filled 2021-10-05: qty 2

## 2021-10-05 MED ORDER — SODIUM CHLORIDE 0.9 % IV SOLN: Freq: Once | INTRAVENOUS | Status: AC

## 2021-10-05 MED ORDER — SODIUM CHLORIDE 0.9% FLUSH
10.0000 mL | INTRAVENOUS | Status: DC | PRN
  Administered 2021-10-05: 10 mL

## 2021-10-06 ENCOUNTER — Telehealth: Payer: Self-pay

## 2021-10-06 ENCOUNTER — Ambulatory Visit: Payer: Self-pay | Admitting: Urology

## 2021-10-06 NOTE — Progress Notes (Signed)
?Radiation Oncology         [phone] ?________________________________ ? ?Name: Melissa [name] MRN: [id number]  ?Date: 10/07/2021  DOB: 02-Aug-1965 ? ?Follow-Up Visit Note ? ?CC: Patient, [name]  [name], [name], * ? ?Diagnosis:   56 yo woman with a new 3 mm metastatic lesion in the right pons from ER+ Her2+ cancer of the upper inner left breast.    ? ?  ICD-10-CM   ?1. Brain metastasis (Lt Breast Ca Primary)  C79.31   ?  ? ?Interval Since Last Radiation:  3 months ?06/29/21: These two targets were treated to a prescription dose of 20 G in a single fraction. ? ? ?03/17/21: These three targets were treated to a prescription dose of 20 G in a single fraction. ? ? ?08/27/20: These five targets were treated to a prescription dose of 20 G in a single fraction. ? ?  ?05/13/20:  SRS brain to the following 4 targets treated to a prescription dose of 20 Gy in a single fraction with a single isocenter:  ?PTV6 Mid Cerebellum 12mm ?PTV7 Lt Occipital 74mm ?PTV8 Lt Occipital 32mm ?PTV9 Lt Temporal 68mm  ? ?03/25/2019:  SRS brain//PTV 5:  Right frontal 5 mm target was treated to a prescription dose of 20 Gy in a single fraction. ?  ?09/04/2018:   SRS Brain// Right Frontal, 2 targets / 20 Gy in 1 fraction ?PTV3: Ant Rt Frontal 61mm 20Gy ?PTV4: Rt Frontal resection cavity 87mm  20Gy ? ?12/18/2017: SRS brain//PTV2: 4 mm Rt Parietal lesion treated to 20 Gy in 1 Fx ? ?08/25/2017, 08/28/2017, 08/30/2017, 09/01/2017, 09/04/2017: PTV1: post op SRS to right frontal lobe resection cavity in 5 fxs ? ?Narrative:  ?In summary, she initially presented to the emergency Department on 07/27/2017 with complaints of headaches ongoing for approximately 5 weeks with associated nausea and vomiting. She was initially treated by her PCP for sinusitis without improvement.  She also has a history of migraine headaches and had ben evaluated in the ED at [facility] on 06/20/2017 for treatment of what she thought was a typical migraine  headache given the fact that she had her usual blurry vision and aura preceding the headache. She reports that the headache did improve with treatment but the relief was short-lived as the headache returned the very next day. She followed up with her primary care physician and a CT of the head was going to be scheduled due to the persistent headaches but in the interim, she presented to the Emergency Department at [facility] due to increased severity of the headache with associated nausea and vomiting. The headaches were occuring in the frontal lobe as well as bilateral temporal with occasional radiation to the occipital lobe and associated with blurry vision, decreased appetite, fatigue, nausea and vomiting.  She denied any difficulty with speech, memory, imbalance or focal weakness. ?  ?CT Head on admission 07/27/2017 showed a large right frontal lobe mass measuring 3.1 by 2.5 cm with significant surrounding edema with mass effect and right to left midline shift measuring 8 mm. A subsequent Brain MRI showed a 3.4 x 2.9 x 2.9 cm right frontal lobe mass with imaging characteristics of solitary metastasis and extensive vasogenic edema resulting in 9 mm right to left midline shift as well as equivocal very early left ventricle entrapment. ?   ? CT Chest, Abdomen, Pelvis on 07/28/2017 for disease staging showed no findings to suggest metastatic breast cancer involving the chest, abdomen, pelvis or osseous  structures. 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 Surgical changes involving the left breast and left axilla but no findings for local recurrence or regional adenopathy. ? ?She proceeded with a gross total resection of the frontal lesion on 08/05/17 with Dr. Cyndy Freeze followed by adjuvant fractionated postop SRS radiotherapy to the resection cavity in February 2019. 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  ? ?She tolerated SRS treatment well and initial post treatment MRI brain on 12/07/17 showed satisfactory appearance of the right anterior frontal lobe post treatment site with expected evolution but unfortunately also showed a single, new 2 - 3 mm lesion in the right parietal lobe without associated edema or mass-effect.  She elected to undergo salvage SRS treatment to the new lesion in the parietal lobe which was completed on 12/18/17 and tolerated well.  A follow up brain MRI on 05/18/18 showed a stable right frontal resection cavity with stable 4 mm nodular enhancement at the inferior cavity margin but no evidence of new disease.  Unfortunately, the follow up MRI brain scan on 08/23/2018 showed a new 11 mm inferior right frontal lesion with only mild associated edema and no mass-effect.  She reported that she had been having more frequent frontal headaches which were not severe and responded to Aleve and otherwise, had been feeling well.  She elected to proceed with a single fraction of SRS to this new brain metastasis as recommended and this was completed on  09/04/18 and tolerated very well.   ?She had bronchoscopy with Dr. Valeta Harms in 05/2018 and dx'ed with Aspergillus- ID following (Dr. Graylon Good).  She also continues in routine follow up with Dr. Melvyn Novas in Pulmonology. ? ?Follow up MRI brain scan from 03/08/19 showed continued interval enlargement of the nodular focus with enhancement at the superior margin of the right frontal resection cavity, measuring 5 x 6 mm in diameter compared with 3 x 4.5 mm on the previous study. Two smaller foci of enhancement along the inferior margin were unchanged and there was no evidence of increasing mass effect or edema and no new lesions seen elsewhere within the brain.  She elected to proceed with SRS treatment of the progressive nodular lesion which was completed on 03/25/19 and tolerated well without any acute ill effects.   ? ?She developed  systemic disease progression, particularly in the chest with increase in size of enhancing nodular left internal mammary soft tissue mass, lymph nodes and left third rib lesion and soft tissue lesion eroding the left sternal body as well as a new, hypermetabolic 6.5 cm liver lesion seen on PET scan from 05/23/2019 despite continuing on lapatinib with Letrozole. She did have an ultrasound-guided biopsy of the liver mass on 06/05/2019 which confirmed metastatic breast cancer, HER-2 positive, ER/PR negative. Her systemic therapy was changed to [place] beginning on 06/19/2019, under the care and direction of Dr. Lindi Adie which she tolerated very well.  A follow-up CT chest from 07/19/2019 showed a positive response to treatment with decrease in size of the soft tissue mass in the breast as well as bony lesions and adenopathy.  Fortunately, her follow-up MRI brain from 08/02/2019 showed stability of the previously treated disease and no new or progressive findings. A repeat MRI brain scan 11/04/19 was felt to be overall stable but there was a slight change in a previously treated right inferior frontal lesion, measuring 5 mm, previously 4 mm, with a  new, separate punctate focus of enhancement just posterior to that. This was such minimal change and when her prior treatment fields were fused with recent scan, it appeared that the new area of enhancement was on the field border of GTV3 and would have received a large portion of the delivered dose.  Therefore, consensus recommendation was to simply monitor this area with routine 3 month follow up brain imaging. She had repeat MRI on 01/30/20 and this showed overall disease stability with an unchanged appearance of the 2 previously treated right frontal lobe deposits and no new or progressive disease. Repeat systemic disease staging scans were performed on 01/29/20 and 02/04/20.  The bone scan on 01/29/20 showed less uptake of tracer at the previously identified sternal and LEFT third  rib lesions, consistent with response to therapy and there were no new sites of osseous metastasis noted. The CT C/A/P performed on 02/04/20 also showed disease stability with a new cluster of spiculated nodule

## 2021-10-06 NOTE — Telephone Encounter (Signed)
Appointment reminder. ? ?I verified patient identity and reminded her of her 8:30am-10/07/21 in-person follow-up appointment w/ Ashlyn Bruning PA-C. I advised patient to arrive 67mn early for check-in. I left my extension [phone] case patient needs anything. Patient verbalized understanding of information given. ?

## 2021-10-07 ENCOUNTER — Ambulatory Visit
Admission: RE | Admit: 2021-10-07 | Discharge: 2021-10-07 | Disposition: A | Discharge status: Home / Self Care | Payer: 59 | Source: Ambulatory Visit | Attending: Urology | Admitting: Urology

## 2021-10-07 ENCOUNTER — Encounter: Payer: Self-pay | Admitting: Urology

## 2021-10-07 ENCOUNTER — Other Ambulatory Visit: Payer: Self-pay

## 2021-10-07 ENCOUNTER — Ambulatory Visit
Admission: RE | Admit: 2021-10-07 | Discharge: 2021-10-07 | Disposition: A | Discharge status: Home / Self Care | Payer: 59 | Source: Ambulatory Visit | Attending: Radiation Oncology | Admitting: Radiation Oncology

## 2021-10-07 VITALS — BP 115/76 | HR 82 | Temp 98.6°F | Resp 20 | Ht 62.0 in | Wt 94.0 lb

## 2021-10-07 DIAGNOSIS — C50212 Malignant neoplasm of upper-inner quadrant of left female breast: Secondary | ICD-10-CM | POA: Insufficient documentation

## 2021-10-07 DIAGNOSIS — Z79899 Other long term (current) drug therapy: Secondary | ICD-10-CM | POA: Diagnosis not present

## 2021-10-07 DIAGNOSIS — C7931 Secondary malignant neoplasm of brain: Secondary | ICD-10-CM | POA: Diagnosis present

## 2021-10-07 DIAGNOSIS — Z170 Estrogen receptor positive status [ER+]: Secondary | ICD-10-CM | POA: Insufficient documentation

## 2021-10-07 DIAGNOSIS — Z923 Personal history of irradiation: Secondary | ICD-10-CM | POA: Insufficient documentation

## 2021-10-07 NOTE — Progress Notes (Signed)
?  Radiation Oncology         [phone] ?________________________________ ? ?Name: Melissa [name] MRN: [id number]  ?Date: 10/07/2021  DOB: December 16, 1965 ? ?SIMULATION AND TREATMENT PLANNING NOTE ? ?  ICD-10-CM   ?1. Brain metastasis (Lt Breast Ca Primary)  C79.31   ?  ?2. Malignant neoplasm of upper-inner quadrant of left female breast, unspecified estrogen receptor status ([place])  C50.212   ?  ? ? ?DIAGNOSIS:  56 yo woman with a new 3 mm metastatic lesion in the right pons from ER+ Her2+ cancer of the upper inner left breast. ? ?NARRATIVE:  The patient was brought to the [facility].  Identity was confirmed.  All relevant records and images related to the planned course of therapy were reviewed.  The patient freely provided informed written consent to proceed with treatment after reviewing the details related to the planned course of therapy. The consent form was witnessed and verified by the simulation staff. Intravenous access was established for contrast administration. Then, the patient was set-up in a stable reproducible supine position for radiation therapy.  A relocatable thermoplastic stereotactic head frame was fabricated for precise immobilization.  CT images were obtained.  Surface markings were placed.  The CT images were loaded into the planning software and fused with the patient's targeting MRI scan.  Then the target and avoidance structures were contoured.  Treatment planning then occurred.  The radiation prescription was entered and confirmed.  I have requested 3D planning  I have requested a DVH of the following structures: Brain stem, brain, left eye, right eye, lenses, optic chiasm, target volumes, uninvolved brain, and normal tissue.   ? ?SPECIAL TREATMENT PROCEDURE:  The planned course of therapy using radiation constitutes a special treatment procedure. Special care is required in the management of this patient for the following reasons. This treatment constitutes a Special  Treatment Procedure for the following reason: High dose per fraction requiring special monitoring for increased toxicities of treatment including daily imaging.  The special nature of the planned course of radiotherapy will require increased physician supervision and oversight to ensure patient's safety with optimal treatment outcomes.  This requires extended time and effort. ? ?PLAN:  The patient will receive 18 Gy in 1 fraction. ? ?________________________________ ? ?Sheral Apley Tammi Klippel, M.D. ? ?

## 2021-10-07 NOTE — Progress Notes (Signed)
Follow up appointment. I verified patient identity and began nursing interview w/ spouse Melissa [name] in attendance. Patient reports occasional headaches 2/10. No other issues reported at this time. ? ?Meaningful use complete. ?Postmenopausal- No chances of pregnancy. ? ?BP 115/76 (BP Location: Right Arm, Patient Position: Sitting, Cuff Size: Normal)   Pulse 82   Temp 98.6 ?F (37 ?C) (Oral)   Resp 20   Ht '5\' 2"'$  (1.575 m)   Wt 94 lb (42.6 kg)   LMP 07/25/2012 Comment: pregnancy waiver form signed 01-18-2018  SpO2 100%   BMI 17.19 kg/m?  ? ?

## 2021-10-12 ENCOUNTER — Encounter: Payer: Self-pay | Admitting: Radiation Oncology

## 2021-10-12 ENCOUNTER — Other Ambulatory Visit: Payer: Self-pay

## 2021-10-12 ENCOUNTER — Ambulatory Visit
Admission: RE | Admit: 2021-10-12 | Discharge: 2021-10-12 | Disposition: A | Discharge status: Home / Self Care | Payer: 59 | Source: Ambulatory Visit | Attending: Radiation Oncology | Admitting: Radiation Oncology

## 2021-10-12 VITALS — BP 122/83 | HR 81 | Temp 97.1°F | Resp 18

## 2021-10-12 DIAGNOSIS — C7931 Secondary malignant neoplasm of brain: Secondary | ICD-10-CM | POA: Insufficient documentation

## 2021-10-12 NOTE — Progress Notes (Addendum)
Mrs. Huffaker rested with [name] for 30 minutes following his [place] treatment.  Patient denies headache, dizziness, nausea, diplopia or ringing in the ears. Denies fatigue. Patient without complaints. Understands to avoid strenuous activity for the next 24 hours and call [phone] with needs.  ? ? ?BP 122/83 (BP Location: Right Arm)   Pulse 81   Temp (!) 97.1 ?F (36.2 ?C)   Resp 18   LMP 07/25/2012 Comment: pregnancy waiver form signed 01-18-2018  SpO2 96%   ? ?Gloriajean Dell. Quincy Simmonds RN, BSN  ? ? ? ?

## 2021-10-14 NOTE — Progress Notes (Signed)
? ?Patient Care Team: ?Patient, [name] as PCP - General (General Practice) ?[name], MD as Referring Physician (Emergency Medicine) ? ?DIAGNOSIS:  ?Encounter Diagnosis  ?Name Primary?  ? Malignant neoplasm of upper-inner quadrant of left female breast, unspecified estrogen receptor status ([place])   ? ? ?SUMMARY OF ONCOLOGIC HISTORY: ?Oncology History  ?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 ---s/p Lumpectomy/ /Initial Ca 1997, Brain Mets 2019  ?06/08/2012 Initial Diagnosis  ? 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 Ki-67 was 70% HER-2 was amplified with a ratio 2.91 ? ?  ?07/12/2012 - 07/17/2013 Neo-Adjuvant Chemotherapy  ? [place] ?6 followed by Herceptin maintenance ? ?  ?12/11/2012 Surgery  ? Left breast lumpectomy: 1.8 cm tumor 1 positive sentinel node, axillary lymph node dissection 02/08/2013 showed 0/13 lymph nodes ? ?  ?03/25/2013 - 05/06/2013 Radiation Therapy  ? Adjuvant radiation therapy ? ?  ?06/05/2013 - 07/20/2017 Anti-estrogen oral therapy  ? Tamoxifen 20 mg daily ? ?  ?07/27/2017 Relapse/Recurrence  ? MRI Brain: 3.4 x 2.9 x 2.9 cm RIGHT frontal lobe mass with imaging characteristics of solitary metastasis. Extensive vasogenic edema resulting in 9 mm RIGHT to LEFT midline shift. Equivocal very early LEFT ventricle entrapment. ? ?  ?08/04/2017 Surgery  ? Rt frontal brain resection: Poorly differentiated tumor IHC suggests breast primary ER and PR Positive ? ?  ?08/25/2017 - 09/04/2017 Radiation Therapy  ? Stereotactic radiation ? ?  ?09/18/2017 -  Anti-estrogen oral therapy  ? Lapatinib with letrozole ? ?  ?12/18/2017 - 12/19/2017 Radiation Therapy  ? New right parietal lobe metastases status post SRS ? ?  ?08/27/2018 - 08/27/2020 Radiation Therapy  ? SRS to new brain metastases ?  ?05/09/2019 Relapse/Recurrence  ? Interval increase in size of the enhancing nodular left internal mammary soft  tissue 2.8 cm.  Redemonstrated enlarged supraclavicular, lower cervical and lower posterior cervical nodes unchanged.  Interval increase in the bony erosion of the posterior and lateral left third rib, increasing soft tissue lesion eroding the left sternal body 3.1 cm was 2.5 cm.  Bronchiectatic changes ?  ?06/19/2019 - 05/05/2020 Chemotherapy  ? ado-trastuzumab emtansine [facility])  ? ?  ?06/15/2020 -  Chemotherapy  ? Xeloda, Tucatinib, Herceptin ? ?  ?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/Initial Ca 1997, Brain Mets 2019  ?06/10/2019 Initial Diagnosis  ? 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[facility]) ? ?  ?06/19/2019 - 05/05/2020 Chemotherapy  ? ado-trastuzumab emtansine [facility])  ? ?  ?Port-A-Cath in place  ? ? ?CHIEF COMPLIANT:  metastatic breast cancer currently on Herceptin ? ?INTERVAL HISTORY: LATRICE STORLIE is a 56 y.o. with above-mentioned history of metastatic breast cancer currently on treatment with Xeloda and Herceptin. She presents to the clinic today for follow-up and treatment. She is tolerating the herceptin. ?She had a brain MRI showing new brain metastases and she underwent stereotactic radiosurgery to that.  The rest of the brain mets apparently are stable.  Her energy levels are excellent.  Taste and appetite have improved.  Thus rashes on her hands have also resolved.  Recently she cooked for 90 people at her daughter's wedding and felt good and energetic. ? ? ?ALLERGIES:  is allergic to aspirin, protonix [pantoprazole], doxycycline, promethazine-codeine, and iodinated contrast media. ? ?MEDICATIONS:  ?Current Outpatient Medications  ?Medication Sig Dispense Refill  ? acetaminophen (TYLENOL) 325 MG tablet Take 2 tablets (650 mg total) by mouth  every 6 (six) hours as needed for mild pain (or Fever >/= 101). 12 tablet 0  ? albuterol (VENTOLIN HFA) 108 (90 Base) MCG/ACT inhaler Inhale 2 puffs into the lungs every 6 (six) hours as needed for wheezing or shortness of breath (cough). 18 g 5  ?  chlorpheniramine-HYDROcodone 10-8 MG/5ML Take 5 mLs by mouth 2 times daily. 115 mL 0  ? cholecalciferol (VITAMIN D3) 25 MCG (1000 UNIT) tablet Take 1,000 Units by mouth daily.    ? Fluticasone-Salmeterol,sensor, (AIRDUO DIGIHALER) 113-14 MCG/ACT AEPB Inhale 1 puff into the lungs 2 (two) times daily. 1 each 4  ? lidocaine-prilocaine (EMLA) cream APPLY EXTERNALLY TO THE AFFECTED AREA 1 TIME 30 g 3  ? Multiple Vitamins-Minerals (EMERGEN-C VITAMIN C PO) Take by mouth.    ? Vitamin E 200 units TABS Take by mouth.    ? voriconazole (VFEND) 200 MG tablet Take 1 tablet (200 mg total) by mouth 2 (two) times daily. 60 tablet 11  ? ?No current facility-administered medications for this visit.  ? ?Facility-Administered Medications Ordered in Other Visits  ?Medication Dose Route Frequency Provider Last Rate Last Admin  ? alteplase (CATHFLO ACTIVASE) injection 2 mg  2 mg Intracatheter Once PRN Nicholas Lose, MD      ? sodium chloride flush (NS) 0.9 % injection 10 mL  10 mL Intracatheter PRN Nicholas Lose, MD   10 mL at 07/06/21 1428  ? ? ?PHYSICAL EXAMINATION: ?ECOG PERFORMANCE STATUS: 1 - Symptomatic but completely ambulatory ? ?Vitals:  ? 10/26/21 0914  ?BP: 123/69  ?Pulse: 76  ?Resp: 18  ?Temp: 97.6 ?F (36.4 ?C)  ?SpO2: 99%  ? ?Filed Weights  ? 10/26/21 0914  ?Weight: 93 lb 12.8 oz (42.5 kg)  ? ?  ? ?LABORATORY DATA:  ?I have reviewed the data as listed ? ?  Latest Ref Rng & Units 09/14/2021  ?  8:18 AM 08/24/2021  ?  9:44 AM 08/16/2021  ?  8:25 AM  ?CMP  ?Glucose 70 - 99 mg/dL 75   84   81    ?BUN 6 - 20 mg/dL _0 ?Creatinine 0.44 - 1.00 mg/dL 0.82   0.80   0.87    ?Sodium 135 - 145 mmol/L 138   137   139    ?Potassium 3.5 - 5.1 mmol/L 3.8   4.3   4.2    ?Chloride 98 - 111 mmol/L 103   104   105    ?CO2 22 - 32 mmol/L _1 ?Calcium 8.9 - 10.3 mg/dL 9.9   9.6   9.7    ?Total Protein 6.5 - 8.1 g/dL 8.2   7.6   8.0    ?Total Bilirubin 0.3 - 1.2 mg/dL 0.2   0.2   0.2    ?Alkaline Phos 38 - 126 U/L 82   83   84     ?AST 15 - 41 U/L _2 ?ALT 0 - 44 U/L _3 ? ? ?Lab Results  ?Component Value Date  ? WBC 12.5 (H) 10/26/2021  ? HGB 11.2 (L) 10/26/2021  ? HCT 35.0 (L) 10/26/2021  ? MCV 88.6 10/26/2021  ? PLT 429 (H) 10/26/2021  ? NEUTROABS 8.2 (H) 10/26/2021  ? ? 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  ?Brain Metastasis: S/P resection of frontal lobe met ER PR positive, HER-2 positive ?  ?Summary: ?1.  SRS brain: 08/25/2017-09/04/2017 ?2. Anti Her 2 therapy with Lapatinib started 09/17/2017-05/14/2019: Stopped for progression ?3.  I discontinued tamoxifen and started her on letrozole 2.5 mg daily.  05/14/2019 stopped for progression ?4.  Stereotactic radiosurgery 12/19/2017 to the new right parietal lobe metastases. ?5.  Kadcyla: Received 16 cycles discontinued 05/05/2020 ?6.  Tucatinib Xeloda: Discontinued because of hand-foot syndrome 06/11/2020-12/15/2020 ?7. Herceptin/Neratinib: unable to tolerate due to diarrhea and weight loss ?8.  SRS to new brain mets 10/01/2021 ?-------------------------------------------------------------------------------------------------------------------- ?Liver Biopsy 06/05/19: Metastatic cancer, ER/PR: 0%, Her 2: 3+ Positive, Ki 67: 20% ?Patient had metastases to liver, bone, brain, and questionably lung ?  ?Bone metastases: Because of dental issues bisphosphonates were not started ?  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  ?-------------------------------------------------------------------------------------------------------

## 2021-10-14 NOTE — Progress Notes (Signed)
?  Radiation Oncology         (336) [phone] ?________________________________ ? ?Stereotactic Treatment Procedure Note ? ?Name: Melissa [name] MRN: [id number]  ?Date: 10/12/2021  DOB: [birth year] ? ?SPECIAL TREATMENT PROCEDURE ? ?  ICD-10-CM   ?1. Metastasis to brain [facility])  C79.31   ?  ? ? ?3D TREATMENT PLANNING AND DOSIMETRY:  The patient's radiation plan was reviewed and approved by neurosurgery and radiation oncology prior to treatment.  It showed 3-dimensional radiation distributions overlaid onto the planning CT/MRI image set.  The [facility] for the target structures as well as the organs at risk were reviewed. The documentation of the 3D plan and dosimetry are filed in the radiation oncology EMR. ? ?NARRATIVE:  Melissa [name] was brought to the TrueBeam stereotactic radiation treatment machine and placed supine on the CT couch. The head frame was applied, and the patient was set up for stereotactic radiosurgery.  Neurosurgery was present for the set-up and delivery ? ?SIMULATION VERIFICATION:  In the couch zero-angle position, the patient underwent Exactrac imaging using the Brainlab system with orthogonal KV images.  These were carefully aligned and repeated to confirm treatment position for each of the isocenters.  The Exactrac snap film verification was repeated at each couch angle. ? ?PROCEDURE: Craig Guess Loa received stereotactic radiosurgery to the following targets: ?Right [place] 3 mm target was treated using 5 Dynamic Conformal Arcs to a prescription dose of 16 Gy.  ExacTrac registration was performed for each couch angle.  The 100% isodose line was prescribed.  6 MV X-rays were delivered in the flattening filter free beam mode. ? ?STEREOTACTIC TREATMENT MANAGEMENT:  Following delivery, the patient was transported to nursing in stable condition and monitored for possible acute effects.  Vital signs were recorded BP 122/83 (BP Location: Right Arm)   Pulse 81   Temp (!) 97.1 ?F (36.2 ?C)   Resp 18   LMP  07/25/2012 Comment: pregnancy waiver form signed 01-18-2018  SpO2 96% . The patient tolerated treatment without significant acute effects, and was discharged to home in stable condition.   ? ?PLAN: Follow-up in one month. ? ?________________________________ ? ?Sheral Apley Tammi Klippel, M.D. ? ? ?

## 2021-10-15 ENCOUNTER — Other Ambulatory Visit (HOSPITAL_COMMUNITY): Payer: Self-pay

## 2021-10-15 ENCOUNTER — Other Ambulatory Visit: Payer: Self-pay | Admitting: Hematology and Oncology

## 2021-10-15 MED ORDER — HYDROCOD POLI-CHLORPHE POLI ER 10-8 MG/5ML PO SUER
5.0000 mL | Freq: Two times a day (BID) | ORAL | 0 refills | Status: DC
  Filled 2021-10-15: qty 115, 12d supply, fill #0

## 2021-10-15 NOTE — Telephone Encounter (Signed)
Mendel Ryder, could you please refill this for Mrs. Fredlund.  She has a hx of lung mets and takes it at night to help with constant coughing.  ?

## 2021-10-18 ENCOUNTER — Other Ambulatory Visit (HOSPITAL_COMMUNITY): Payer: Self-pay

## 2021-10-26 ENCOUNTER — Inpatient Hospital Stay: Payer: 59 | Admitting: Nutrition

## 2021-10-26 ENCOUNTER — Inpatient Hospital Stay: Payer: 59

## 2021-10-26 ENCOUNTER — Telehealth: Payer: Self-pay | Admitting: *Deleted

## 2021-10-26 ENCOUNTER — Other Ambulatory Visit: Payer: Self-pay

## 2021-10-26 ENCOUNTER — Inpatient Hospital Stay: Payer: 59 | Attending: Medical | Admitting: Hematology and Oncology

## 2021-10-26 ENCOUNTER — Other Ambulatory Visit: Payer: 59

## 2021-10-26 ENCOUNTER — Other Ambulatory Visit: Payer: Self-pay | Admitting: *Deleted

## 2021-10-26 DIAGNOSIS — C50212 Malignant neoplasm of upper-inner quadrant of left female breast: Secondary | ICD-10-CM | POA: Diagnosis present

## 2021-10-26 DIAGNOSIS — Z5112 Encounter for antineoplastic immunotherapy: Secondary | ICD-10-CM | POA: Diagnosis present

## 2021-10-26 DIAGNOSIS — Z452 Encounter for adjustment and management of vascular access device: Secondary | ICD-10-CM | POA: Insufficient documentation

## 2021-10-26 DIAGNOSIS — C7931 Secondary malignant neoplasm of brain: Secondary | ICD-10-CM | POA: Insufficient documentation

## 2021-10-26 DIAGNOSIS — Z171 Estrogen receptor negative status [ER-]: Secondary | ICD-10-CM

## 2021-10-26 DIAGNOSIS — Z170 Estrogen receptor positive status [ER+]: Secondary | ICD-10-CM | POA: Insufficient documentation

## 2021-10-26 DIAGNOSIS — C787 Secondary malignant neoplasm of liver and intrahepatic bile duct: Secondary | ICD-10-CM | POA: Insufficient documentation

## 2021-10-26 DIAGNOSIS — Z95828 Presence of other vascular implants and grafts: Secondary | ICD-10-CM

## 2021-10-26 DIAGNOSIS — C7951 Secondary malignant neoplasm of bone: Secondary | ICD-10-CM | POA: Insufficient documentation

## 2021-10-26 LAB — CMP (CANCER CENTER ONLY)
ALT: 17 U/L (ref 0–44)
AST: 20 U/L (ref 15–41)
Albumin: 3.8 g/dL (ref 3.5–5.0)
Alkaline Phosphatase: 74 U/L (ref 38–126)
Anion gap: 5 (ref 5–15)
BUN: 15 mg/dL (ref 6–20)
CO2: 28 mmol/L (ref 22–32)
Calcium: 9.8 mg/dL (ref 8.9–10.3)
Chloride: 105 mmol/L (ref 98–111)
Creatinine: 0.9 mg/dL (ref 0.44–1.00)
GFR, Estimated: >60 mL/min (ref >60)
Glucose, Bld: 70 mg/dL (ref 70–99)
Potassium: 4.5 mmol/L (ref 3.5–5.1)
Sodium: 138 mmol/L (ref 135–145)
Total Bilirubin: 0.2 mg/dL — ABNORMAL LOW (ref 0.3–1.2)
Total Protein: 8 g/dL (ref 6.5–8.1)

## 2021-10-26 LAB — MAGNESIUM: Magnesium: 2.2 mg/dL (ref 1.7–2.4)

## 2021-10-26 LAB — CBC WITH DIFFERENTIAL (CANCER CENTER ONLY)
Abs Immature Granulocytes: 0.03 10*3/uL (ref 0.00–0.07)
Basophils Absolute: 0.1 10*3/uL (ref 0.0–0.1)
Basophils Relative: 1 %
Eosinophils Absolute: 1.3 10*3/uL — ABNORMAL HIGH (ref 0.0–0.5)
Eosinophils Relative: 10 %
HCT: 35 % — ABNORMAL LOW (ref 36.0–46.0)
Hemoglobin: 11.2 g/dL — ABNORMAL LOW (ref 12.0–15.0)
Immature Granulocytes: 0 %
Lymphocytes Relative: 15 %
Lymphs Abs: 1.9 10*3/uL (ref 0.7–4.0)
MCH: 28.4 pg (ref 26.0–34.0)
MCHC: 32 g/dL (ref 30.0–36.0)
MCV: 88.6 fL (ref 80.0–100.0)
Monocytes Absolute: 1.1 10*3/uL — ABNORMAL HIGH (ref 0.1–1.0)
Monocytes Relative: 8 %
Neutro Abs: 8.2 10*3/uL — ABNORMAL HIGH (ref 1.7–7.7)
Neutrophils Relative %: 66 %
Platelet Count: 429 10*3/uL — ABNORMAL HIGH (ref 150–400)
RBC: 3.95 MIL/uL (ref 3.87–5.11)
RDW: 18.2 % — ABNORMAL HIGH (ref 11.5–15.5)
WBC Count: 12.5 10*3/uL — ABNORMAL HIGH (ref 4.0–10.5)
nRBC: 0 % (ref 0.0–0.2)

## 2021-10-26 MED ORDER — ACETAMINOPHEN 325 MG PO TABS
650.0000 mg | ORAL_TABLET | Freq: Once | ORAL | Status: AC
  Administered 2021-10-26: 650 mg via ORAL
  Filled 2021-10-26: qty 2

## 2021-10-26 MED ORDER — SODIUM CHLORIDE 0.9% FLUSH
10.0000 mL | INTRAVENOUS | Status: DC | PRN
  Administered 2021-10-26: 10 mL

## 2021-10-26 MED ORDER — DIPHENHYDRAMINE HCL 25 MG PO CAPS
25.0000 mg | ORAL_CAPSULE | Freq: Once | ORAL | Status: AC
  Administered 2021-10-26: 25 mg via ORAL
  Filled 2021-10-26: qty 1

## 2021-10-26 MED ORDER — SODIUM CHLORIDE 0.9 % IV SOLN: Freq: Once | INTRAVENOUS | Status: AC

## 2021-10-26 MED ORDER — ALTEPLASE 2 MG IJ SOLR
2.0000 mg | Freq: Once | INTRAMUSCULAR | Status: AC | PRN
  Administered 2021-10-26: 2 mg
  Filled 2021-10-26: qty 2

## 2021-10-26 MED ORDER — HEPARIN SOD (PORK) LOCK FLUSH 100 UNIT/ML IV SOLN
500.0000 [IU] | Freq: Once | INTRAVENOUS | Status: AC | PRN
  Administered 2021-10-26: 500 [IU]

## 2021-10-26 MED ORDER — TRASTUZUMAB-ANNS CHEMO 150 MG IV SOLR
6.0000 mg/kg | Freq: Once | INTRAVENOUS | Status: AC
  Administered 2021-10-26: 252 mg via INTRAVENOUS
  Filled 2021-10-26: qty 12

## 2021-10-26 NOTE — Assessment & Plan Note (Signed)
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 ?Brain Metastasis: S/P resection of frontal lobe met?ER PR positive, HER-2 positive ?? ?Summary: ?1.??SRS?brain: 08/25/2017-09/04/2017 ?2. Anti Her 2 therapy with Lapatinib?started 09/17/2017-05/14/2019: Stopped for progression ?3.??I discontinued?tamoxifen and started her on letrozole 2.5 mg daily.??05/14/2019 stopped for progression ?4.??Stereotactic radiosurgery 12/19/2017 to the new right parietal lobe metastases. ?5.??Kadcyla: Received 16 cycles discontinued 05/05/2020 ?6.??Tucatinib Xeloda: Discontinued because of hand-foot syndrome 06/11/2020-12/15/2020 ?7. Herceptin/Neratinib: unable to tolerate due to diarrhea and weight loss ?8.  SRS to new brain mets 10/01/2021 ?-------------------------------------------------------------------------------------------------------------------- ?Liver Biopsy 06/05/19: Metastatic cancer, ER/PR: 0%, Her 2: 3+ Positive, Ki 67: 20% ?Patient had?metastases to liver, bone, brain, and questionably lung ?? ?Bone metastases: Because of dental issues bisphosphonates were not started ?? 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 ?------------------------------------------------------------------------------------------------------------- ?Current treatment:?Herceptin ?PET CT scan 08/13/2021: No PET avid tumors in the chest abdomen and pelvis. ?The spot on the liver noted on the CT scan did not have any activity on the PET scan. ?Postinflammatory  changes in the lungs chronic sequelae of atypical infection ? ?Brain MRI 10/01/2021: Progression of disease.  2 new lesions, total of 9 enhancing brain metastases, SRS completed by Dr. Tammi Klippel ? ?Based on this we decided to continue with Herceptin maintenance alone. ?The only other option would be Margetuximab. ?Return to clinic every 3 weeks for Herceptin every 6 weeks to follow-up with me with labs through the port. ?

## 2021-10-26 NOTE — Patient Instructions (Signed)
[facility]  Discharge Instructions: °Thank you for choosing [facility] to provide your oncology and hematology care.  ° °If you have a lab appointment with the Cancer Center, please go directly to the Cancer Center and check in at the registration area. °  °Wear comfortable clothing and clothing appropriate for easy access to any Portacath or PICC line.  ° °We strive to give you quality time with your provider. You may need to reschedule your appointment if you arrive late (15 or more minutes).  Arriving late affects you and other patients whose appointments are after yours.  Also, if you miss three or more appointments without notifying the office, you may be dismissed from the clinic at the provider’s discretion.    °  °For prescription refill requests, have your pharmacy contact our office and allow 72 hours for refills to be completed.   ° °Today you received the following chemotherapy and/or immunotherapy agent: Trastuzumab (Kanjinti). °  °To help prevent nausea and vomiting after your treatment, we encourage you to take your nausea medication as directed. ° °BELOW ARE SYMPTOMS THAT SHOULD BE REPORTED IMMEDIATELY: °*FEVER GREATER THAN 100.4 F (38 °C) OR HIGHER °*CHILLS OR SWEATING °*NAUSEA AND VOMITING THAT IS NOT CONTROLLED WITH YOUR NAUSEA MEDICATION °*UNUSUAL SHORTNESS OF BREATH °*UNUSUAL BRUISING OR BLEEDING °*URINARY PROBLEMS (pain or burning when urinating, or frequent urination) °*BOWEL PROBLEMS (unusual diarrhea, constipation, pain near the anus) °TENDERNESS IN MOUTH AND THROAT WITH OR WITHOUT PRESENCE OF ULCERS (sore throat, sores in mouth, or a toothache) °UNUSUAL RASH, SWELLING OR PAIN  °UNUSUAL VAGINAL DISCHARGE OR ITCHING  ° °Items with * indicate a potential emergency and should be followed up as soon as possible or go to the Emergency Department if any problems should occur. ° °Please show the CHEMOTHERAPY ALERT CARD or IMMUNOTHERAPY ALERT CARD at  check-in to the Emergency Department and triage nurse. ° °Should you have questions after your visit or need to cancel or reschedule your appointment, please contact  [facility]  Dept: [phone]  and follow the prompts.  Office hours are 8:00 a.m. to 4:30 p.m. Monday - Friday. Please note that voicemails left after 4:00 p.m. may not be returned until the following business day.  We are closed weekends and major holidays. You have access to a nurse at all times for urgent questions. Please call the main number to the clinic Dept: [phone] and follow the prompts. ° ° °For any non-urgent questions, you may also contact your provider using MyChart. We now offer e-Visits for anyone 18 and older to request care online for non-urgent symptoms. For details visit [email or web address]. °  °Also download the MyChart app! Go to the app store, search "MyChart", open the app, select , and log in with your MyChart username and password. ° °Due to Covid, a mask is required upon entering the hospital/clinic. If you do not have a mask, one will be given to you upon arrival. For doctor visits, patients may have 1 support person aged 18 or older with them. For treatment visits, patients cannot have anyone with them due to current Covid guidelines and our immunocompromised population.  ° °

## 2021-10-26 NOTE — Telephone Encounter (Signed)
Called pt. to inform her of dye study for port a cath. IR to call and schedule appt. ?

## 2021-10-26 NOTE — Progress Notes (Signed)
MD alerted by infusion RN that pt did not have any blood return from port a cath 3 hours post cathflo administration. Verbal orders received by MD for pt to undergo dye study to evaluate port a cath.  Orders placed, pt notified and verbalized understanding.  ?

## 2021-10-26 NOTE — Progress Notes (Signed)
Nutrition follow-up completed with patient during infusion for metastatic breast cancer. ?Weight improved and was documented as 93 pounds 13 ounces on April 18 increased from 92 pounds March 7. ?Patient reports she is eating very well.   ?She eats 3 meals +2 snacks a day. ?She is drinking 2 [employer] 1.4 every day. ?She denies nutrition impact symptoms. ?She has no questions or concerns. ? ?Nutrition diagnosis: Inadequate oral intake improving. ? ?Intervention: ?Educated to continue strategies for adding extra calories and protein in small amounts of food throughout the day. ?Continue [employer] 1.4 twice daily. ?Contact RD with questions. ? ?Monitoring, evaluation, goals: ?Patient will work to increase calories and protein to minimize weight loss. ? ?Next visit: ?To be scheduled as needed. ? ?**Disclaimer: This note was dictated with voice recognition software. Similar sounding words can inadvertently be transcribed and this note may contain transcription errors which may not have been corrected upon publication of note.** ? ?

## 2021-10-26 NOTE — Progress Notes (Deleted)
? ?  Covid-19 Vaccination Clinic ? ?Name:  Melissa [name]    ?MRN: [id number] ?DOB: 04-Nov-1965 ? ?10/26/2021 ? ?[name] was observed post Covid-19 immunization for {COVID Vaccine Observation Times:23551} without incident. She was provided with Vaccine Information Sheet and instruction to access the V-Safe system.  ? ?[name] was instructed to call 911 with any severe reactions post vaccine: ?Difficulty breathing  ?Swelling of face and throat  ?A fast heartbeat  ?A bad rash all over body  ?Dizziness and weakness  ? ?   ?

## 2021-10-27 ENCOUNTER — Other Ambulatory Visit: Payer: Self-pay | Admitting: Student

## 2021-10-27 MED ORDER — DIPHENHYDRAMINE HCL 50 MG PO TABS: 50.0000 mg | ORAL_TABLET | Freq: Once | ORAL | 0 refills | Status: DC

## 2021-10-27 MED ORDER — CONTRAST ALLERGY PREMED PACK 3 X 50 MG & 1 X 50 MG PO KIT: 1.0000 | PACK | Freq: Once | ORAL | 0 refills | Status: DC

## 2021-10-27 MED ORDER — PREDNISONE 50 MG PO TABS: ORAL_TABLET | ORAL | 0 refills | Status: DC

## 2021-10-28 ENCOUNTER — Other Ambulatory Visit (HOSPITAL_COMMUNITY): Payer: Self-pay

## 2021-10-29 ENCOUNTER — Other Ambulatory Visit (HOSPITAL_COMMUNITY): Payer: Self-pay

## 2021-11-02 ENCOUNTER — Other Ambulatory Visit (HOSPITAL_COMMUNITY): Payer: 59

## 2021-11-04 NOTE — Addendum Note (Signed)
Encounter addended by: Karsten Ro, DO on: 11/04/2021 8:53 AM ? Actions taken: Clinical Note Signed

## 2021-11-04 NOTE — Op Note (Signed)
Name: KONSTANTINA NACHREINER    MRN: [id number]   ?Date: 10/12/2021    DOB: 03-16-1966 ? ? ?STEREOTACTIC RADIOSURGERY OPERATIVE NOTE ? 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 ?POST-OPERATIVE DIAGNOSIS:  Same ? ?PROCEDURE:  Stereotactic Radiosurgery ? ?SURGEON:  Rael Tilly, DO ? ?RADIATION ONCOLOGIST: Dr. Tammi Klippel ? ?TECHNIQUE:  ?The patient underwent a radiation treatment planning session in the radiation oncology simulation suite under the care of the radiation oncology physician and physicist.  I participated closely in the radiation treatment planning afterwards. The patient underwent planning CT which was fused to 3T high resolution MRI with 1 mm axial slices.  These images were fused on the planning system.  We contoured the gross target volumes and subsequently expanded this to yield the Planning Target Volume. I actively participated in the planning process.  I helped to define and review the target contours and also the contours of the optic pathway, eyes, brainstem and selected nearby organs at risk.  All the dose constraints for critical structures were reviewed and compared to AAPM Task Group 101.  The prescription dose conformity was reviewed.  I approved the plan electronically.   ? ?Accordingly, ENNIS DELPOZO  was brought to the TrueBeam stereotactic radiation treatment linac and placed in the custom immobilization mask.  The patient was aligned according to the IR fiducial markers with BrainLab Exactrac, then orthogonal x-rays were used in ExacTrac with the 6DOF robotic table and the shifts were made to align the patient. ? ?Craig Guess Calia received stereotactic radiosurgery to a prescription dose of 16Gy uneventfully.   ? ?The detailed description of the procedure is recorded in the radiation oncology procedure note.  I was present for the duration of the procedure. ? ?DISPOSITION:   ?Following delivery, the patient was transported to nursing  in stable condition and monitored for possible acute effects to be discharged to home in stable condition with follow-up in one month. ? ?[place] Brelee Renk, DO ?[facility] ? ?

## 2021-11-09 ENCOUNTER — Other Ambulatory Visit (HOSPITAL_COMMUNITY): Payer: 59

## 2021-11-12 ENCOUNTER — Encounter: Payer: Self-pay | Admitting: Hematology and Oncology

## 2021-11-12 ENCOUNTER — Ambulatory Visit (HOSPITAL_COMMUNITY)
Admission: RE | Admit: 2021-11-12 | Discharge: 2021-11-12 | Disposition: A | Discharge status: Home / Self Care | Payer: 59 | Source: Ambulatory Visit | Attending: Hematology and Oncology | Admitting: Hematology and Oncology

## 2021-11-12 DIAGNOSIS — Z95828 Presence of other vascular implants and grafts: Secondary | ICD-10-CM

## 2021-11-12 DIAGNOSIS — Z452 Encounter for adjustment and management of vascular access device: Secondary | ICD-10-CM | POA: Insufficient documentation

## 2021-11-12 HISTORY — PX: IR CV LINE INJECTION: IMG2294

## 2021-11-12 MED ORDER — IOHEXOL 300 MG/ML  SOLN
50.0000 mL | Freq: Once | INTRAMUSCULAR | Status: AC | PRN
  Administered 2021-11-12: 10 mL

## 2021-11-12 MED ORDER — HEPARIN SOD (PORK) LOCK FLUSH 100 UNIT/ML IV SOLN
INTRAVENOUS | Status: AC
  Filled 2021-11-12: qty 5

## 2021-11-12 MED ORDER — HEPARIN SOD (PORK) LOCK FLUSH 100 UNIT/ML IV SOLN
INTRAVENOUS | Status: DC | PRN
  Administered 2021-11-12: 500 [IU]

## 2021-11-16 ENCOUNTER — Other Ambulatory Visit: Payer: 59

## 2021-11-16 ENCOUNTER — Inpatient Hospital Stay: Payer: 59 | Attending: Medical

## 2021-11-16 ENCOUNTER — Other Ambulatory Visit: Payer: Self-pay

## 2021-11-16 ENCOUNTER — Encounter: Payer: Self-pay | Admitting: Urology

## 2021-11-16 VITALS — BP 101/76 | HR 86 | Temp 98.0°F | Resp 18 | Wt 92.5 lb

## 2021-11-16 DIAGNOSIS — Z5112 Encounter for antineoplastic immunotherapy: Secondary | ICD-10-CM | POA: Insufficient documentation

## 2021-11-16 DIAGNOSIS — C7931 Secondary malignant neoplasm of brain: Secondary | ICD-10-CM | POA: Insufficient documentation

## 2021-11-16 DIAGNOSIS — Z170 Estrogen receptor positive status [ER+]: Secondary | ICD-10-CM | POA: Diagnosis not present

## 2021-11-16 DIAGNOSIS — Z171 Estrogen receptor negative status [ER-]: Secondary | ICD-10-CM

## 2021-11-16 DIAGNOSIS — C787 Secondary malignant neoplasm of liver and intrahepatic bile duct: Secondary | ICD-10-CM | POA: Insufficient documentation

## 2021-11-16 DIAGNOSIS — C50212 Malignant neoplasm of upper-inner quadrant of left female breast: Secondary | ICD-10-CM | POA: Diagnosis present

## 2021-11-16 DIAGNOSIS — C7951 Secondary malignant neoplasm of bone: Secondary | ICD-10-CM | POA: Diagnosis not present

## 2021-11-16 MED ORDER — DIPHENHYDRAMINE HCL 25 MG PO CAPS
25.0000 mg | ORAL_CAPSULE | Freq: Once | ORAL | Status: AC
  Administered 2021-11-16: 25 mg via ORAL
  Filled 2021-11-16: qty 1

## 2021-11-16 MED ORDER — SODIUM CHLORIDE 0.9% FLUSH
10.0000 mL | INTRAVENOUS | Status: DC | PRN
  Administered 2021-11-16: 10 mL

## 2021-11-16 MED ORDER — HEPARIN SOD (PORK) LOCK FLUSH 100 UNIT/ML IV SOLN
500.0000 [IU] | Freq: Once | INTRAVENOUS | Status: AC | PRN
  Administered 2021-11-16: 500 [IU]

## 2021-11-16 MED ORDER — ACETAMINOPHEN 325 MG PO TABS
650.0000 mg | ORAL_TABLET | Freq: Once | ORAL | Status: AC
  Administered 2021-11-16: 650 mg via ORAL
  Filled 2021-11-16: qty 2

## 2021-11-16 MED ORDER — SODIUM CHLORIDE 0.9 % IV SOLN: Freq: Once | INTRAVENOUS | Status: AC

## 2021-11-16 MED ORDER — TRASTUZUMAB-ANNS CHEMO 150 MG IV SOLR
6.0000 mg/kg | Freq: Once | INTRAVENOUS | Status: AC
  Administered 2021-11-16: 252 mg via INTRAVENOUS
  Filled 2021-11-16: qty 12

## 2021-11-16 NOTE — Progress Notes (Signed)
Telephone appointment. I verified patient identity and began nursing interview. Patient is doing well. No issues reported at this time. ? ?Meaningful use complete. ?Postmenopausal- NO chances of pregnancy. ? ?Reminded patient of their 9:00am-11/17/21 telephone appointment w/ Ashlyn Bruning PA-C. I left my extension [phone] in case patient needs anything. Patient verbalized understanding of information. ? ?Patient contact [phone] ?

## 2021-11-16 NOTE — Patient Instructions (Signed)
[name]  Discharge Instructions: Thank you for choosing [name] to provide your oncology and hematology care.   If you have a lab appointment with the [name], please go directly to the [name] and check in at the registration area.   Wear comfortable clothing and clothing appropriate for easy access to any Portacath or PICC line.   We strive to give you quality time with your provider. You may need to reschedule your appointment if you arrive late (15 or more minutes).  Arriving late affects you and other patients whose appointments are after yours.  Also, if you miss three or more appointments without notifying the office, you may be dismissed from the clinic at the provider's discretion.      For prescription refill requests, have your pharmacy contact our office and allow 72 hours for refills to be completed.    Today you received the following chemotherapy and/or immunotherapy agents Trastuzumab-anns (Kanjinti)      To help prevent nausea and vomiting after your treatment, we encourage you to take your nausea medication as directed.  BELOW ARE SYMPTOMS THAT SHOULD BE REPORTED IMMEDIATELY: *FEVER GREATER THAN 100.4 F (38 C) OR HIGHER *CHILLS OR SWEATING *NAUSEA AND VOMITING THAT IS NOT CONTROLLED WITH YOUR NAUSEA MEDICATION *UNUSUAL SHORTNESS OF BREATH *UNUSUAL BRUISING OR BLEEDING *URINARY PROBLEMS (pain or burning when urinating, or frequent urination) *BOWEL PROBLEMS (unusual diarrhea, constipation, pain near the anus) TENDERNESS IN MOUTH AND THROAT WITH OR WITHOUT PRESENCE OF ULCERS (sore throat, sores in mouth, or a toothache) UNUSUAL RASH, SWELLING OR PAIN  UNUSUAL VAGINAL DISCHARGE OR ITCHING   Items with * indicate a potential emergency and should be followed up as soon as possible or go to the Emergency Department if any problems should occur.  Please show the CHEMOTHERAPY ALERT CARD or IMMUNOTHERAPY ALERT  CARD at check-in to the Emergency Department and triage nurse.  Should you have questions after your visit or need to cancel or reschedule your appointment, please contact [name]  Dept: [phone]  and follow the prompts.  Office hours are 8:00 a.m. to 4:30 p.m. Monday - Friday. Please note that voicemails left after 4:00 p.m. may not be returned until the following business day.  We are closed weekends and major holidays. You have access to a nurse at all times for urgent questions. Please call the main number to the clinic Dept: [phone] and follow the prompts.   For any non-urgent questions, you may also contact your provider using MyChart. We now offer e-Visits for anyone 18 and older to request care online for non-urgent symptoms. For details visit [email or web address].   Also download the MyChart app! Go to the app store, search "MyChart", open the app, select [place], and log in with your MyChart username and password.  Due to Covid, a mask is required upon entering the hospital/clinic. If you do not have a mask, one will be given to you upon arrival. For doctor visits, patients may have 1 support person aged 18 or older with them. For treatment visits, patients cannot have anyone with them due to current Covid guidelines and our immunocompromised population.   

## 2021-11-17 ENCOUNTER — Other Ambulatory Visit: Payer: Self-pay | Admitting: Radiation Therapy

## 2021-11-17 ENCOUNTER — Ambulatory Visit
Admission: RE | Admit: 2021-11-17 | Discharge: 2021-11-17 | Disposition: A | Discharge status: Home / Self Care | Payer: 59 | Source: Ambulatory Visit | Attending: Urology | Admitting: Urology

## 2021-11-17 DIAGNOSIS — C7931 Secondary malignant neoplasm of brain: Secondary | ICD-10-CM

## 2021-11-17 NOTE — Progress Notes (Addendum)
?Radiation Oncology         [phone] ?________________________________ ? ?Name: [name] MRN: [id number]  ?Date: 11/17/2021  DOB: [birth year] ? ?Post-treatment Note ? ?CC: Patient, [name]  No ref. provider found ? ?Diagnosis:   56 yo woman with a new 3 mm metastatic lesion in the right pons from ER+ Her2+ cancer of the upper inner left breast.    ? ?  ICD-10-CM   ?1. 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 [facility])  C79.31   ?  ? ?Interval Since Last Radiation:  1 month ?10/12/21: The 3 mm target in the right pons was treated to a prescription dose of 20 G in a single fraction. ? ?06/29/21: These two targets were treated to a prescription dose of 20 G in a single fraction. ? ? ?03/17/21: These three targets were treated to a prescription dose of 20 G in a single fraction. ? ? ?08/27/20: These five targets were treated to a prescription dose of 20 G in a single fraction. ? ?  ?05/13/20:  SRS brain to the following 4 targets treated to a prescription dose of 20 Gy in a single fraction with a single isocenter:  ?PTV6 Mid Cerebellum 45mm ?PTV7 Lt Occipital 33mm ?PTV8 Lt Occipital 54mm ?PTV9 Lt Temporal 30mm  ? ?03/25/2019:  SRS brain//PTV 5:  Right frontal 5 mm target was treated to a prescription dose of 20 Gy in a single fraction. ?  ?09/04/2018:   SRS Brain// Right Frontal, 2 targets / 20 Gy in 1 fraction ?PTV3: Ant Rt Frontal 86mm 20Gy ?PTV4: Rt Frontal resection cavity 69mm  20Gy ? ?12/18/2017: SRS brain//PTV2: 4 mm Rt Parietal lesion treated to 20 Gy in 1 Fx ? ?08/25/2017, 08/28/2017, 08/30/2017, 09/01/2017, 09/04/2017: PTV1: post op SRS to right frontal lobe resection cavity in 5 fxs ? ?Narrative:  ?In summary, she initially presented to the emergency Department on 07/27/2017 with complaints of headaches ongoing for approximately 5 weeks with associated nausea and vomiting. She was initially treated by her PCP for sinusitis without improvement.  She also has a history of migraine headaches and had ben evaluated  in the ED at [facility] on 06/20/2017 for treatment of what she thought was a typical migraine headache given the fact that she had her usual blurry vision and aura preceding the headache. She reports that the headache did improve with treatment but the relief was short-lived as the headache returned the very next day. She followed up with her primary care physician and a CT of the head was going to be scheduled due to the persistent headaches but in the interim, she presented to the Emergency Department at [facility] due to increased severity of the headache with associated nausea and vomiting. The headaches were occuring in the frontal lobe as well as bilateral temporal with occasional radiation to the occipital lobe and associated with blurry vision, decreased appetite, fatigue, nausea and vomiting.  She denied any difficulty with speech, memory, imbalance or focal weakness. ?  ?CT Head on admission 07/27/2017 showed a large right frontal lobe mass measuring 3.1 by 2.5 cm with significant surrounding edema with mass effect and right to left midline shift measuring 8 mm. A subsequent Brain MRI showed a 3.4 x 2.9 x 2.9 cm right frontal lobe mass with imaging characteristics of solitary metastasis and extensive vasogenic edema resulting in 9 mm right to left midline shift as well as equivocal very early left ventricle entrapment. ?   ? CT Chest,  Abdomen, Pelvis on 07/28/2017 for disease staging showed no findings to suggest metastatic breast cancer involving the chest, abdomen, pelvis or osseous structures. 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 Surgical changes involving the left breast and left axilla but no findings for local recurrence or regional adenopathy. ? ?She proceeded with a gross total resection of the frontal lesion on 08/05/17 with Dr. Cyndy Freeze followed by adjuvant fractionated postop SRS radiotherapy to the resection cavity in February 2019. Final  surgical pathology revealed poorly differentiated adenocarcinoma consistent with metastatic breast cancer, ER/PR positive and HER-2 positive.  ? ?She tolerated SRS treatment well and initial post treatment MRI brain on 12/07/17 showed satisfactory appearance of the right anterior frontal lobe post treatment site with expected evolution but unfortunately also showed a single, new 2 - 3 mm lesion in the right parietal lobe without associated edema or mass-effect.  She elected to undergo salvage SRS treatment to the new lesion in the parietal lobe which was completed on 12/18/17 and tolerated well.  A follow up brain MRI on 05/18/18 showed a stable right frontal resection cavity with stable 4 mm nodular enhancement at the inferior cavity margin but no evidence of new disease.  Unfortunately, the follow up MRI brain scan on 08/23/2018 showed a new 11 mm inferior right frontal lesion with only mild associated edema and no mass-effect.  She reported that she had been having more frequent frontal headaches which were not severe and responded to Aleve and otherwise, had been feeling well.  She elected to proceed with a single fraction of SRS to this new brain metastasis as recommended and this was completed on  09/04/18 and tolerated very well.   ?She had bronchoscopy with Dr. Valeta Harms in 05/2018 and dx'ed with Aspergillus- ID following (Dr. Graylon Good).  She also continues in routine follow up with Dr. Melvyn Novas in Pulmonology. ? ?Follow up MRI brain scan from 03/08/19 showed continued interval enlargement of the nodular focus with enhancement at the superior margin of the right frontal resection cavity, measuring 5 x 6 mm in diameter compared with 3 x 4.5 mm on the previous study. Two smaller foci of enhancement along the inferior margin were unchanged and there was no evidence of increasing mass effect or edema and no new lesions seen elsewhere within the brain.  She elected to proceed with SRS treatment of the progressive nodular lesion  which was completed on 03/25/19 and tolerated well without any acute ill effects.   ? ?She developed systemic disease progression, particularly in the chest with increase in size of enhancing nodular left internal mammary soft tissue mass, lymph nodes and left third rib lesion and soft tissue lesion eroding the left sternal body as well as a new, hypermetabolic 6.5 cm liver lesion seen on PET scan from 05/23/2019 despite continuing on lapatinib with Letrozole. She did have an ultrasound-guided biopsy of the liver mass on 06/05/2019 which confirmed metastatic breast cancer, HER-2 positive, ER/PR negative. Her systemic therapy was changed to Bel-Nor beginning on 06/19/2019, under the care and direction of Dr. Lindi Adie which she tolerated very well.  A follow-up CT chest from 07/19/2019 showed a positive response to treatment with decrease in size of the soft tissue mass in the breast as well as bony lesions and adenopathy.  Fortunately, her follow-up MRI brain from 08/02/2019 showed stability of the previously treated disease and no new or progressive findings. A repeat MRI brain scan 11/04/19 was felt to be overall stable  but there was a slight change in a previously treated right inferior frontal lesion, measuring 5 mm, previously 4 mm, with a new, separate punctate focus of enhancement just posterior to that. This was such minimal change and when her prior treatment fields were fused with recent scan, it appeared that the new area of enhancement was on the field border of GTV3 and would have received a large portion of the delivered dose.  Therefore, consensus recommendation was to simply monitor this area with routine 3 month follow up brain imaging. She had repeat MRI on 01/30/20 and this showed overall disease stability with an unchanged appearance of the 2 previously treated right frontal lobe deposits and no new or progressive disease. Repeat systemic disease staging scans were performed on 01/29/20 and 02/04/20.  The  bone scan on 01/29/20 showed less uptake of tracer at the previously identified sternal and LEFT third rib lesions, consistent with response to therapy and there were no new sites of osseous metastasis

## 2021-11-20 ENCOUNTER — Encounter: Payer: Self-pay | Admitting: Hematology and Oncology

## 2021-11-21 ENCOUNTER — Other Ambulatory Visit: Payer: Self-pay | Admitting: Adult Health

## 2021-11-22 ENCOUNTER — Ambulatory Visit (INDEPENDENT_AMBULATORY_CARE_PROVIDER_SITE_OTHER): Payer: 59 | Admitting: Infectious Disease

## 2021-11-22 ENCOUNTER — Other Ambulatory Visit (HOSPITAL_COMMUNITY): Payer: Self-pay

## 2021-11-22 ENCOUNTER — Encounter: Payer: Self-pay | Admitting: Infectious Disease

## 2021-11-22 ENCOUNTER — Other Ambulatory Visit: Payer: Self-pay

## 2021-11-22 ENCOUNTER — Ambulatory Visit: Payer: Medicare Other | Admitting: Infectious Disease

## 2021-11-22 VITALS — BP 120/74 | HR 101 | Resp 16 | Ht 61.0 in | Wt 93.5 lb

## 2021-11-22 DIAGNOSIS — C50212 Malignant neoplasm of upper-inner quadrant of left female breast: Secondary | ICD-10-CM

## 2021-11-22 DIAGNOSIS — B479 Mycetoma, unspecified: Secondary | ICD-10-CM

## 2021-11-22 DIAGNOSIS — B449 Aspergillosis, unspecified: Secondary | ICD-10-CM | POA: Diagnosis not present

## 2021-11-22 DIAGNOSIS — B441 Other pulmonary aspergillosis: Secondary | ICD-10-CM

## 2021-11-22 DIAGNOSIS — Z720 Tobacco use: Secondary | ICD-10-CM

## 2021-11-22 MED ORDER — VORICONAZOLE 200 MG PO TABS
200.0000 mg | ORAL_TABLET | Freq: Two times a day (BID) | ORAL | 11 refills | Status: DC
  Filled 2021-11-22: qty 60, 30d supply, fill #0
  Filled 2021-12-25: qty 60, 30d supply, fill #1
  Filled 2022-01-24: qty 60, 30d supply, fill #2

## 2021-11-22 NOTE — Progress Notes (Signed)
? ?Subjective:  ?Chief complaint follow-up mycetoma ? ? Patient ID: [name], female    DOB: 01/14/1966, 56 y.o.   MRN: [id number] ? ?HPI ? 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  Along the way she was diagnosed with breast cancer 2013 underwent lumpectomy and chemotherapy.  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  She is following with Dr. Lindi Adie with oncology.  She is subsequent been found to have other metastases including to her sternum and liver. ? ?She developed cavitary lung infection and underwent bronchoscopy which revealed Aspergillus fumigatus.  She was on Cresemba since then. ? ?On recent repeat CT of the chest abdomen pelvis for surveillance for malignancy she was found to have a new apparent mycetoma in one of the large cavities in her left upper lung.  She also has some spiculated lesions in the posterior left lobe lobe of her lung.  Radiology is concerned that the apparent mycetoma is due to Aspergillus or another mold.  There was similar concerned that the spiculated areas could represent Aspergillus though we have no culture data to guide [name]. ? ?She continued on Cresemba at that point ? ?She then had bronchoscopy with BAL. ? ?Fungal cultures from the bronchoscopy have been negative though the patient was on Cresemba at the time.  There is 1 AFB culture that is positive but not for tuberculosis. ? ?Since then apparently (and I had to go to [employer] on web (not available on epic) they stated that Mycobacteria could NOT grow on subculture so no ID ? ?In the interim the patient's insurance had denied Cresemba and she has been changed to voriconazole.  Unfortunately she has been found to  have metastases of her breast cancer to the brain. ? ?She  underwent stereotactic radiation to these lesions by Dr. London Pepper ? ?Her chemotherapeutic regimen previously included Tukysa (tucatinib) which can elevate voriconazole levels and potentially cause voriconazole toxicity including QT prolongation. ? ?We checjked her voriconazole levels and they have been in the therapeutic range recently ? ?EKG showed:  ?her EKG showed a QTC of 448 with a QTC of 465 ms last EKG that I could find in the computer that I could actually read was in 2019 in which she had a QTC of 442. ? ? ?She did tell us that she is coughing less since being on voriconazole.   ?. ? ?She had MRI of the brain done this February 2022 showed decrease in size of 3 lesions but multiple new lesions. ? ?Her regimen was Xeloda Herceptin and Tucatinib ? ?I last saw her she was developing a rash on her hands which worsened as well as on her lips. ? ?Ultimately her oncologist Dr. Lindi Adie stopped her Xeloda and Tucatinib and she is on Herceptin alone.  ? ?In the interim she then had CT chest abdomen pelvis.  From an infectious disease standpoint there is increase in her cavitation which is not altogether surprising and there were areas of increased nodularity and specially in the left lower lung. ? ?She was bothered by this and sought second opinion at [facility] and see by Dr. Gaylyn Lambert who carefully reviewed her case and felt that there was no need for further aggressive  inventions to work this up nor need to change antifungal therapy. ? ?She also had an MRI of the brain that showed new lesions.  She was seen by Stereotactic Radiosurgery to 3 lesions on March 17 2021. ? ?She started new chemotherapy in the form of neratinib whose drug concentrations are increased by voriconazole. ? ?However Dr Lindi Adie started at a lower dose which they have tried to escalate.  She told me she took 3 pills without difficulty but when she went to 5 pills  she had severe diarrhea and had to be given IV fluids for rehydration.   ? ?She is now off of neratinib but still on herceptin ? ?He did again have stereotactic surgery for brain metastasis with Dr. Reatha Armour in December 2000 2022 ? ?She had a CT of the chest performed in January 2023: ? ? ?This did not show PE as it was a CT angiogram did show increased opacity in left lung base with some minimal effusion with multiple serpiginous cystic lesions that were stable.  Also extensive emphysematous changes seen. ? ? ?Shortly after getting her CT scan she developed neck pain chest pain which she seemed to be pleurisy to her and was ultimately mid to the hospital and treated for pneumonia with IV antibiotics followed by doxycycline and cefdinir.  She does not tolerate these antibiotics particularly doxycycline.  She saw my partner Dr. West Bali in clinic while she is off antibiotics and she gave  Augmentin to finish a course of antibiotics. ? ?[name] has undergone repeat SRS to brain metastases in March of 2023 and remains on herceptin alone. ? ?Her coughing is improved dramatically since I last saw her she says she is smoking less than half a pack of cigarettes sometimes only 10/day now.  She is taking her voriconazole religiously. ? ?Her husband is also stop smoking altogether. ? ? ? ? ? ?Past Medical History:  ?Diagnosis Date  ? Anemia   ? Arthritis   ? knees and hips  ? 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 Asthma   ? Breast cancer ([place])   ? Bronchiectasis ([place])   ? Bronchiolitis   ? Cancer [facility])   ? breast cancer 2014  ? Cancer, metastatic to liver [facility])   ? 2021  ? Complication of anesthesia   ? bp dropped + desat   ? COPD (chronic obstructive pulmonary disease) ([place])   ? Dyspnea   ? DOE  ? GERD (gastroesophageal reflux disease)   ? H/O coccidioidomycosis   ? was reason for lung lobectomy  ? Headache(784.0)   ? due to eye strain or not eating  ? History of anemia   ? no current problem  ? History of asthma   ? as a child  ?  History of breast cancer 2014  ? left  ? History of chemotherapy   ? finished 07/17/2013  ? History of hiatal hernia   ? AGE 33  ? Hx of radiation therapy 03/25/13-05/06/13  ? left breast 5000 cGy/25 sessions, left breast boost 1000 cGy/5 sessions  ? 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  ? Pneumonia   ? LAST FLARE UP 01/2018  ? Rash 02/22/2021  ? Runny nose 07/30/2013  ? clear drainage  ? Smoker 05/24/2021  ? Wears dentures   ? upper  ? ? ?Past Surgical History:  ?Procedure Laterality Date  ? APPLICATION OF CRANIAL NAVIGATION N/A 08/04/2017  ? Procedure: APPLICATION OF CRANIAL NAVIGATION;  Surgeon: [name], [name], MD;  Location: [place];  Service: Neurosurgery;  Laterality:  N/A;  ? AXILLARY LYMPH NODE DISSECTION Left 02/08/2013  ? Procedure: LEFT AXILLARY DISSECTION;  Surgeon: Edward Jolly, MD;  Location: [place];  Service: General;  Laterality: Left;  ? BREAST CYST EXCISION Right 2006  ? BREAST LUMPECTOMY Left 2014  ? BREAST LUMPECTOMY WITH NEEDLE LOCALIZATION AND AXILLARY SENTINEL LYMPH NODE BX Left 12/31/2012  ? Procedure: NEEDLE LOCALIZATION LEFT BREAST LUMPECTOMY AND LEFT AXILLARY SENTENIAL LYMPH NODE BX;  Surgeon: Edward Jolly, MD;  Location: [place];  Service: General;  Laterality: Left;  ? BRONCHIAL BRUSHINGS  03/17/2020  ? Procedure: BRONCHIAL BRUSHINGS;  Surgeon: Garner Nash, DO;  Location: [place] [facility];  Service: Pulmonary;;  ? BRONCHIAL WASHINGS  03/17/2020  ? Procedure: BRONCHIAL WASHINGS;  Surgeon: Garner Nash, DO;  Location: [place] [facility];  Service: Pulmonary;;  ? CESAREAN SECTION  1995/1996  ? CRANIOTOMY Right 08/04/2017  ? Procedure: Right Frontal craniotomy for resection of tumor with stereotactic navigation;  Surgeon: [name], [name], MD;  Location: [place];  Service: Neurosurgery;  Laterality: Right;  Right Frontal craniotomy for resection of tumor with stereotactic navigation  ? IR CV LINE INJECTION  02/19/2021  ? IR CV LINE INJECTION  11/12/2021  ? IR IMAGING  GUIDED PORT INSERTION  05/31/2019  ? LUNG LOBECTOMY Left 05/1996  ? upper lobe - due to [facility] Fever  ? PORT-A-CATH REMOVAL Right 08/02/2013  ? Procedure: REMOVAL PORT-A-CATH;  Surgeon: Edward Jolly

## 2021-11-23 ENCOUNTER — Other Ambulatory Visit (HOSPITAL_COMMUNITY): Payer: Self-pay

## 2021-11-23 MED ORDER — HYDROCOD POLI-CHLORPHE POLI ER 10-8 MG/5ML PO SUER
5.0000 mL | Freq: Two times a day (BID) | ORAL | 0 refills | Status: DC
  Filled 2021-11-23: qty 115, 12d supply, fill #0

## 2021-11-24 ENCOUNTER — Other Ambulatory Visit (HOSPITAL_COMMUNITY): Payer: Self-pay

## 2021-11-29 ENCOUNTER — Other Ambulatory Visit: Payer: Self-pay | Admitting: Hematology and Oncology

## 2021-12-07 ENCOUNTER — Other Ambulatory Visit: Payer: Self-pay

## 2021-12-07 ENCOUNTER — Inpatient Hospital Stay: Payer: 59

## 2021-12-07 ENCOUNTER — Inpatient Hospital Stay: Payer: 59 | Admitting: Dietician

## 2021-12-07 ENCOUNTER — Encounter: Payer: Self-pay | Admitting: Adult Health

## 2021-12-07 ENCOUNTER — Inpatient Hospital Stay: Payer: 59 | Admitting: Adult Health

## 2021-12-07 VITALS — BP 111/69 | HR 73 | Temp 97.7°F | Resp 18 | Ht 61.0 in | Wt 97.1 lb

## 2021-12-07 DIAGNOSIS — C50212 Malignant neoplasm of upper-inner quadrant of left female breast: Secondary | ICD-10-CM | POA: Diagnosis not present

## 2021-12-07 DIAGNOSIS — C7931 Secondary malignant neoplasm of brain: Secondary | ICD-10-CM | POA: Diagnosis not present

## 2021-12-07 DIAGNOSIS — Z95828 Presence of other vascular implants and grafts: Secondary | ICD-10-CM

## 2021-12-07 DIAGNOSIS — Z5112 Encounter for antineoplastic immunotherapy: Secondary | ICD-10-CM | POA: Diagnosis not present

## 2021-12-07 DIAGNOSIS — Z171 Estrogen receptor negative status [ER-]: Secondary | ICD-10-CM

## 2021-12-07 LAB — CBC WITH DIFFERENTIAL (CANCER CENTER ONLY)
Abs Immature Granulocytes: 0.03 10*3/uL (ref 0.00–0.07)
Basophils Absolute: 0.1 10*3/uL (ref 0.0–0.1)
Basophils Relative: 1 %
Eosinophils Absolute: 0.8 10*3/uL — ABNORMAL HIGH (ref 0.0–0.5)
Eosinophils Relative: 7 %
HCT: 30.5 % — ABNORMAL LOW (ref 36.0–46.0)
Hemoglobin: 10.3 g/dL — ABNORMAL LOW (ref 12.0–15.0)
Immature Granulocytes: 0 %
Lymphocytes Relative: 17 %
Lymphs Abs: 1.9 10*3/uL (ref 0.7–4.0)
MCH: 29.8 pg (ref 26.0–34.0)
MCHC: 33.8 g/dL (ref 30.0–36.0)
MCV: 88.2 fL (ref 80.0–100.0)
Monocytes Absolute: 1 10*3/uL (ref 0.1–1.0)
Monocytes Relative: 8 %
Neutro Abs: 7.8 10*3/uL — ABNORMAL HIGH (ref 1.7–7.7)
Neutrophils Relative %: 67 %
Platelet Count: 388 10*3/uL (ref 150–400)
RBC: 3.46 MIL/uL — ABNORMAL LOW (ref 3.87–5.11)
RDW: 18.6 % — ABNORMAL HIGH (ref 11.5–15.5)
WBC Count: 11.5 10*3/uL — ABNORMAL HIGH (ref 4.0–10.5)
nRBC: 0 % (ref 0.0–0.2)

## 2021-12-07 LAB — CMP (CANCER CENTER ONLY)
ALT: 12 U/L (ref 0–44)
AST: 17 U/L (ref 15–41)
Albumin: 3.7 g/dL (ref 3.5–5.0)
Alkaline Phosphatase: 67 U/L (ref 38–126)
Anion gap: 4 — ABNORMAL LOW (ref 5–15)
BUN: 16 mg/dL (ref 6–20)
CO2: 30 mmol/L (ref 22–32)
Calcium: 9.6 mg/dL (ref 8.9–10.3)
Chloride: 102 mmol/L (ref 98–111)
Creatinine: 0.79 mg/dL (ref 0.44–1.00)
GFR, Estimated: >60 mL/min (ref >60)
Glucose, Bld: 96 mg/dL (ref 70–99)
Potassium: 4.2 mmol/L (ref 3.5–5.1)
Sodium: 136 mmol/L (ref 135–145)
Total Bilirubin: 0.2 mg/dL — ABNORMAL LOW (ref 0.3–1.2)
Total Protein: 7.3 g/dL (ref 6.5–8.1)

## 2021-12-07 LAB — MAGNESIUM: Magnesium: 1.9 mg/dL (ref 1.7–2.4)

## 2021-12-07 MED ORDER — ACETAMINOPHEN 325 MG PO TABS
650.0000 mg | ORAL_TABLET | Freq: Once | ORAL | Status: AC
  Administered 2021-12-07: 650 mg via ORAL
  Filled 2021-12-07: qty 2

## 2021-12-07 MED ORDER — SODIUM CHLORIDE 0.9% FLUSH
10.0000 mL | INTRAVENOUS | Status: DC | PRN
  Administered 2021-12-07: 10 mL

## 2021-12-07 MED ORDER — DIPHENHYDRAMINE HCL 25 MG PO CAPS
25.0000 mg | ORAL_CAPSULE | Freq: Once | ORAL | Status: AC
  Administered 2021-12-07: 25 mg via ORAL
  Filled 2021-12-07: qty 1

## 2021-12-07 MED ORDER — PREDNISONE 50 MG PO TABS: ORAL_TABLET | ORAL | 0 refills | Status: DC

## 2021-12-07 MED ORDER — HEPARIN SOD (PORK) LOCK FLUSH 100 UNIT/ML IV SOLN
500.0000 [IU] | Freq: Once | INTRAVENOUS | Status: AC | PRN
  Administered 2021-12-07: 500 [IU]

## 2021-12-07 MED ORDER — TRASTUZUMAB-ANNS CHEMO 150 MG IV SOLR
6.0000 mg/kg | Freq: Once | INTRAVENOUS | Status: AC
  Administered 2021-12-07: 252 mg via INTRAVENOUS
  Filled 2021-12-07: qty 12

## 2021-12-07 MED ORDER — SODIUM CHLORIDE 0.9 % IV SOLN: Freq: Once | INTRAVENOUS | Status: AC

## 2021-12-07 NOTE — Progress Notes (Signed)
Per Wilber Bihari, NP - okay to proceed with treatment with echo from 08/05/21.

## 2021-12-07 NOTE — [place] Notes (Signed)
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 ---s/p Lumpectomy/ /Initial Ca 1997, Brain Mets 2019 Staging form: Breast, AJCC 7th Edition - Clinical: Stage IIA (T2, N0, cM0) - Unsigned Histopathologic type: 9931 Laterality: Left Tumor size (mm): 13 Staging comments: Staged at breast conference 12.4.13  - Pathologic: Stage IIA (T1c, N1, cM0) - Unsigned Histopathologic type: 9931 Laterality: Left Tumor size (mm): 13   SUMMARY OF ONCOLOGIC HISTORY: Oncology History  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 Ki-67 was 70% HER-2 was amplified with a ratio 2.91    07/12/2012 - 07/17/2013 Neo-Adjuvant Chemotherapy   TCH 6 followed by Herceptin maintenance    12/11/2012 Surgery   Left breast lumpectomy: 1.8 cm tumor 1 positive sentinel node, axillary lymph node dissection 02/08/2013 showed 0/13 lymph nodes    03/25/2013 - 05/06/2013 Radiation Therapy   Adjuvant radiation therapy    06/05/2013 - 07/20/2017 Anti-estrogen oral therapy   Tamoxifen 20 mg daily    07/27/2017 Relapse/Recurrence   MRI Brain: 3.4 x 2.9 x 2.9 cm RIGHT frontal lobe mass with imaging characteristics of solitary metastasis. Extensive vasogenic edema resulting in 9 mm RIGHT to LEFT midline shift. Equivocal very early LEFT ventricle entrapment.    08/04/2017 Surgery   Rt frontal brain resection: Poorly differentiated tumor IHC suggests breast primary ER and PR Positive    08/25/2017 - 09/04/2017 Radiation Therapy   Stereotactic radiation    09/18/2017 -  Anti-estrogen oral therapy   Lapatinib with letrozole    12/18/2017 -  12/19/2017 Radiation Therapy   New right parietal lobe metastases status post [facility]    08/27/2018 - 08/27/2020 Radiation Therapy   SRS to new brain metastases   05/09/2019 Relapse/Recurrence   Interval increase in size of the enhancing nodular left internal mammary soft tissue 2.8 cm.  Redemonstrated enlarged supraclavicular, lower cervical and lower posterior cervical nodes unchanged.  Interval increase in the bony erosion of the posterior and lateral left third rib, increasing soft tissue lesion eroding the left sternal body 3.1 cm was 2.5 cm.  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 female returns for    Patient Active Problem List   Diagnosis Date Noted  . 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 Chronic pulmonary aspergillosis ([place]) 08/01/2021  . Pleural effusion 08/01/2021  . Acid-fast bacteria present 08/01/2021  . Pneumonia 07/25/2021  . Smoker 05/24/2021  . Drug rash 02/22/2021  . Gastroesophageal reflux disease 03/05/2020  . Healthcare maintenance 03/05/2020  . 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[place]) 02/26/2020  . 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  Port-A-Cath in place 08/05/2019  . 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 Mediastinal adenopathy   . H/O coccidioidomycosis and Aspergillosus 05/16/2018  . DOE (dyspnea on exertion) 05/03/2018  . Diarrhea 04/22/2018  . Cavitary  lesion of lung in area of previous cyts in apex of Sup Segment of LLL 03/29/2018  . Aortic atherosclerosis ([place]) 01/22/2018  . Emphysema lung ([place]) 01/22/2018  . CAP (community acquired pneumonia) 11/23/2017  . Metastasis to brain ([place]) 08/04/2017  . 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 ([place]) 07/27/2017  . Nicotine abuse 07/27/2017  . Migraines 07/27/2017  . HCAP (healthcare-associated pneumonia) 11/26/2016  . Upper airway cough syndrome 08/10/2016  . Abnormal echocardiogram 08/07/2013  . Chest tightness or pressure 08/07/2013  . Hx of radiation therapy   . Edema of left lower extremity 11/19/2012  . Tachycardia 09/20/2012  . Obstructive bronchiectasis ([place]) 01/26/2011  . Cigarette smoker 01/26/2011    is allergic to aspirin, protonix [pantoprazole], doxycycline, promethazine-codeine, and iodinated contrast media.  MEDICAL HISTORY: Past Medical History:  Diagnosis Date  . Anemia   . Arthritis    knees and hips  . 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[place]) 02/26/2020  . Asthma   . Breast cancer ([place])   . Bronchiectasis ([place])   . Bronchiolitis   . Cancer [facility])    breast cancer 2014  . Cancer, metastatic to liver ([place])    2021  . Complication of anesthesia    bp dropped + desat   . COPD (chronic obstructive pulmonary disease) ([place])   . Dyspnea    DOE  . GERD (gastroesophageal reflux disease)   . H/O coccidioidomycosis    was reason for lung lobectomy  . Headache(784.0)    due to eye strain or not eating  . History of anemia    no current problem  . History of asthma    as a child  . History of breast cancer 2014   left  . History of chemotherapy    finished 07/17/2013  . History of hiatal hernia    AGE 40  . Hx of radiation therapy 03/25/13-05/06/13   left breast 5000 cGy/25 sessions, left breast boost 1000 cGy/5 sessions  . 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 Pneumonia    LAST FLARE UP 01/2018  . Rash 02/22/2021  . Runny nose 07/30/2013   clear drainage  . Smoker 05/24/2021  . Wears dentures     upper    SURGICAL HISTORY: Past Surgical History:  Procedure Laterality Date  . APPLICATION OF CRANIAL NAVIGATION N/A 08/04/2017   Procedure: APPLICATION OF CRANIAL NAVIGATION;  Surgeon: Melissa [name], [name], MD;  Location: [place];  Service: Neurosurgery;  Laterality: N/A;  . AXILLARY LYMPH NODE DISSECTION Left 02/08/2013   Procedure: LEFT AXILLARY DISSECTION;  Surgeon: Edward Jolly, MD;  Location: [place];  Service: General;  Laterality: Left;  . BREAST CYST EXCISION Right 2006  . BREAST LUMPECTOMY Left 2014  . BREAST LUMPECTOMY WITH NEEDLE LOCALIZATION AND AXILLARY SENTINEL LYMPH NODE BX Left 12/31/2012   Procedure: NEEDLE LOCALIZATION LEFT BREAST LUMPECTOMY AND LEFT AXILLARY SENTENIAL LYMPH NODE BX;  Surgeon: Edward Jolly, MD;  Location: [place];  Service: General;  Laterality: Left;  . BRONCHIAL BRUSHINGS  03/17/2020   Procedure: BRONCHIAL BRUSHINGS;  Surgeon: Garner Nash, DO;  Location: [place] [facility];  Service: Pulmonary;;  . BRONCHIAL WASHINGS  03/17/2020   Procedure: BRONCHIAL WASHINGS;  Surgeon: Garner Nash, DO;  Location: [place];  Service: Pulmonary;;  . CESAREAN SECTION  1995/1996  . CRANIOTOMY Right 08/04/2017   Procedure: Right Frontal craniotomy for resection of tumor with stereotactic navigation;  Surgeon: Melissa [name], [name], MD;  Location: [place];  Service: Neurosurgery;  Laterality: Right;  Right Frontal craniotomy for resection of tumor with stereotactic navigation  . IR CV LINE INJECTION  02/19/2021  . IR CV LINE INJECTION  11/12/2021  . IR IMAGING GUIDED PORT INSERTION  05/31/2019  . LUNG LOBECTOMY Left 05/1996   upper lobe - due to [facility] Fever  . PORT-A-CATH REMOVAL Right 08/02/2013   Procedure: REMOVAL PORT-A-CATH;  Surgeon: Edward Jolly, MD;  Location: [place];  Service: General;  Laterality: Right;  . PORTACATH PLACEMENT  07/02/2012   Procedure: INSERTION PORT-A-CATH;  Surgeon: Edward Jolly, MD;  Location: [place];  Service: General;  Laterality: N/A;  right  . VIDEO BRONCHOSCOPY N/A 03/17/2020   Procedure: VIDEO BRONCHOSCOPY;  Surgeon: Garner Nash, DO;  Location: [place] [facility];  Service: Pulmonary;  Laterality: N/A;  . VIDEO BRONCHOSCOPY WITH ENDOBRONCHIAL ULTRASOUND N/A 05/23/2018   Procedure: VIDEO BRONCHOSCOPY WITH ENDOBRONCHIAL ULTRASOUND;  Surgeon: Garner Nash, DO;  Location: [place] OR;  Service: Thoracic;  Laterality: N/A;    SOCIAL HISTORY: Social History   Socioeconomic History  . Marital status: Married    Spouse name: Not on file  . Number of children: 2  . Years of education: Not on file  . Highest education level: Not on file  Occupational History  . Occupation: Secondary school teacher  Tobacco Use  . Smoking status: Every Day    Packs/day: 0.30    Years: 38.00    Pack years: 11.40    Types: Cigarettes  . Smokeless tobacco: Never  . Tobacco comments:    10 cigarettes smoked daily 10/19/20 [facility]   Vaping Use  . Vaping Use: Never used  Substance and Sexual Activity  . Alcohol use: No  . Drug use: No  . Sexual activity: Not Currently  Other Topics Concern  . Not on file  Social History Narrative  . Not on file   Social Determinants of Health   Financial Resource Strain: Not on file  Food Insecurity: Not on file  Transportation Needs: Not on file  Physical Activity: Not on file  Stress: Not on file  Social Connections: Not on file  Intimate Partner Violence: Not on file    FAMILY HISTORY: Family History  Problem Relation Age of Onset  . Emphysema Mother        was a smoker  . Heart disease Mother   . Melanoma Mother        dx in her 54s  . Breast cancer Mother 11  . Asthma Brother   . Breast cancer Cousin        mother's maternal cousin; dx in her 37s    Review of Systems - Oncology    PHYSICAL EXAMINATION  ECOG PERFORMANCE STATUS: {CHL ONC ECOG BZ:[id number]}  Vitals:   12/07/21 1339  BP: 111/69   Pulse: 73  Resp: 18  Temp: 97.7 F (36.5 C)  SpO2: 97%    Physical Exam  LABORATORY DATA:  CBC    Component Value Date/Time   WBC 11.5 (H) 12/07/2021 1231   WBC 8.7 07/26/2021 0411   RBC 3.46 (L) 12/07/2021 1231   HGB 10.3 (L) 12/07/2021 1231   HGB 13.1 08/12/2014 0955   HCT 30.5 (L) 12/07/2021 1231   HCT 40.4 08/12/2014  0955   PLT 388 12/07/2021 1231   PLT 228 08/12/2014 0955   MCV 88.2 12/07/2021 1231   MCV 98.2 08/12/2014 0955   MCH 29.8 12/07/2021 1231   MCHC 33.8 12/07/2021 1231   RDW 18.6 (H) 12/07/2021 1231   RDW 13.0 08/12/2014 0955   LYMPHSABS 1.9 12/07/2021 1231   LYMPHSABS 2.0 08/12/2014 0955   MONOABS 1.0 12/07/2021 1231   MONOABS 0.5 08/12/2014 0955   EOSABS 0.8 (H) 12/07/2021 1231   EOSABS 0.2 08/12/2014 0955   BASOSABS 0.1 12/07/2021 1231   BASOSABS 0.0 08/12/2014 0955    CMP     Component Value Date/Time   NA 138 10/26/2021 0911   NA 142 08/12/2014 0956   K 4.5 10/26/2021 0911   K 4.6 08/12/2014 0956   CL 105 10/26/2021 0911   CL 100 12/20/2012 1509   CO2 28 10/26/2021 0911   CO2 26 08/12/2014 0956   GLUCOSE 70 10/26/2021 0911   GLUCOSE 94 08/12/2014 0956   GLUCOSE 98 12/20/2012 1509   BUN 15 10/26/2021 0911   BUN 10.1 08/12/2014 0956   CREATININE 0.90 10/26/2021 0911   CREATININE 0.88 03/11/2019 1057   CREATININE 0.9 08/12/2014 0956   CALCIUM 9.8 10/26/2021 0911   CALCIUM 9.3 08/12/2014 0956   PROT 8.0 10/26/2021 0911   PROT 7.1 08/12/2014 0956   ALBUMIN 3.8 10/26/2021 0911   ALBUMIN 3.8 08/12/2014 0956   AST 20 10/26/2021 0911   AST 15 08/12/2014 0956   ALT 17 10/26/2021 0911   ALT 11 08/12/2014 0956   ALKPHOS 74 10/26/2021 0911   ALKPHOS 68 08/12/2014 0956   BILITOT 0.2 (L) 10/26/2021 0911   BILITOT 0.25 08/12/2014 0956   GFRNONAA >60 10/26/2021 0911   GFRNONAA 76 03/11/2019 1057   GFRAA >60 03/24/2020 1012   GFRAA >60 02/11/2020 0836   GFRAA 88 03/11/2019 1057       PENDING LABS:   RADIOGRAPHIC STUDIES:  No  results found.   PATHOLOGY:     ASSESSMENT and THERAPY PLAN:   No problem-specific Assessment & Plan notes found for this encounter.   No orders of the defined types were placed in this encounter.   All questions were answered. The patient knows to call the clinic with any problems, questions or concerns. We can certainly see the patient much sooner if necessary. This note was electronically signed. Scot Dock, NP 12/07/2021

## 2021-12-07 NOTE — Patient Instructions (Signed)
[facility]]   Discharge Instructions: Thank you for choosing [facility] to provide your oncology and hematology care.   If you have a lab appointment with the Cancer Center, please go directly to the Cancer Center and check in at the registration area.   Wear comfortable clothing and clothing appropriate for easy access to any Portacath or PICC line.   We strive to give you quality time with your provider. You may need to reschedule your appointment if you arrive late (15 or more minutes).  Arriving late affects you and other patients whose appointments are after yours.  Also, if you miss three or more appointments without notifying the office, you may be dismissed from the clinic at the provider's discretion.      For prescription refill requests, have your pharmacy contact our office and allow 72 hours for refills to be completed.    Today you received the following chemotherapy and/or immunotherapy agents: Trastuzumab (Herceptin)       To help prevent nausea and vomiting after your treatment, we encourage you to take your nausea medication as directed.  BELOW ARE SYMPTOMS THAT SHOULD BE REPORTED IMMEDIATELY: *FEVER GREATER THAN 100.4 F (38 C) OR HIGHER *CHILLS OR SWEATING *NAUSEA AND VOMITING THAT IS NOT CONTROLLED WITH YOUR NAUSEA MEDICATION *UNUSUAL SHORTNESS OF BREATH *UNUSUAL BRUISING OR BLEEDING *URINARY PROBLEMS (pain or burning when urinating, or frequent urination) *BOWEL PROBLEMS (unusual diarrhea, constipation, pain near the anus) TENDERNESS IN MOUTH AND THROAT WITH OR WITHOUT PRESENCE OF ULCERS (sore throat, sores in mouth, or a toothache) UNUSUAL RASH, SWELLING OR PAIN  UNUSUAL VAGINAL DISCHARGE OR ITCHING   Items with * indicate a potential emergency and should be followed up as soon as possible or go to the Emergency Department if any problems should occur.  Please show the CHEMOTHERAPY ALERT CARD or IMMUNOTHERAPY ALERT  CARD at check-in to the Emergency Department and triage nurse.  Should you have questions after your visit or need to cancel or reschedule your appointment, please contact [facility]  Dept: [phone]  and follow the prompts.  Office hours are 8:00 a.m. to 4:30 p.m. Monday - Friday. Please note that voicemails left after 4:00 p.m. may not be returned until the following business day.  We are closed weekends and major holidays. You have access to a nurse at all times for urgent questions. Please call the main number to the clinic Dept: [phone] and follow the prompts.   For any non-urgent questions, you may also contact your provider using MyChart. We now offer e-Visits for anyone 18 and older to request care online for non-urgent symptoms. For details visit [email or web address].   Also download the MyChart app! Go to the app store, search "MyChart", open the app, select [place], and log in with your MyChart username and password.  Due to Covid, a mask is required upon entering the hospital/clinic. If you do not have a mask, one will be given to you upon arrival. For doctor visits, patients may have 1 support person aged 18 or older with them. For treatment visits, patients cannot have anyone with them due to current Covid guidelines and our immunocompromised population.  

## 2021-12-07 NOTE — [place] Notes (Signed)
Nutrition Follow-up:  Patient with metastatic breast cancer. She is currently receiving trastuzumab q21d.   Met with patient in infusion. She reports feeling "great." Patient reports good appetite and working to gain weight. Patient eating 3 meals + bedtime snack. Yesterday pt recalls bowl of cereal with fair life whole milk for breakfast, left over pizza for lunch, beef stroganoff, corn, beans for dinner, bowl of ice cream for dessert and 2 shakes in between meals. She is drinking 2 [employer]. Patient recalls filling 22 ounce cup 3-4 times daily with flavored water. Patient denies nutrition impact symptoms.  Medications: chlorpheniramine-hydrocodone, vfend  Labs: reviewed   Anthropometrics: Weight 97 lb 1.6 oz today increased   5/15 - 93 lb 8 oz  4/18 - 93 lb 12.8 oz 3/28 - 94 lb 8 oz    NUTRITION DIAGNOSIS: Inadequate oral intake improved    INTERVENTION:  Continue 3 meals + bedtime snack with adequate calories and protein  Continue drinking [employer] 1.4 twice daily    MONITORING, EVALUATION, GOAL: weight trends, intake    NEXT VISIT: To be scheduled as needed

## 2021-12-09 ENCOUNTER — Telehealth: Payer: Self-pay

## 2021-12-09 ENCOUNTER — Telehealth: Payer: Self-pay | Admitting: Hematology and Oncology

## 2021-12-09 ENCOUNTER — Encounter: Payer: Self-pay | Admitting: Hematology and Oncology

## 2021-12-09 NOTE — Telephone Encounter (Signed)
Called pt to confirm CT CAP appt as well as directions for exam. Pt understands 13 hr prep protocol as well as PO contrast/NPO directions. She is scheduled for 12/16/21 at [facility] and knows to arrive at 0800 for check in. Pt states she will go by [facility] radiology to pick up contrast so she does not have to drive all the way to [facility]. Pt knows to call with and questions.

## 2021-12-09 NOTE — Telephone Encounter (Signed)
Scheduled appointment per 5/30 los. Left message.

## 2021-12-09 NOTE — Assessment & Plan Note (Signed)
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 Anti Her 2 therapy with Lapatinibstarted 09/17/2017-05/14/2019: Stopped for progression 3.I discontinuedtamoxifen and started her on letrozole 2.5 mg daily.05/14/2019 stopped for progression 4.Stereotactic radiosurgery 12/19/2017 to the new right parietal lobe metastases. 5.Kadcyla: Received 16 cycles discontinued 05/05/2020 6.Tucatinib Xeloda: Discontinued because of hand-foot syndrome 06/11/2020-12/15/2020 7. Herceptin/Neratinib: unable to tolerate due to diarrhea and weight loss 8.  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 The spot on the liver noted on the CT scan did not have any activity on the PET scan. Postinflammatory changes in the lungs chronic sequelae of atypical infection  Brain MRI 10/01/2021: Progression of disease.  2 new lesions, total of 9 enhancing  brain metastases, SRS completed by Dr. Tammi Klippel ------------------------------------------------------------------------------------------------------------- Current treatment:Herceptin  Melissa [name] continues on Herceptin alone for her metastatic breast cancer.  She has no clinical signs of breast cancer progression today.  She will be due for restaging with brain MRI and CT chest, abdomen, pelvis in late June.  Radiation oncology and neuro oncology are following her brain MRIs.  I placed orders for her CT restaging.    Her echocardiogram has remained stable and we are repeating echos every 6 months.  I placed order for repeat echocardiogram to be scheduled in July.    [name]'s weight has increased and this is great.  I encouraged her to continue with her oral intake.  She will return in 3 weeks for her next treatment and in 6 weeks for labs, f/u with Dr. Lindi Adie, and treatment.  At this visit he will review her restaging scans with her.

## 2021-12-16 ENCOUNTER — Ambulatory Visit (HOSPITAL_COMMUNITY)
Admission: RE | Admit: 2021-12-16 | Discharge: 2021-12-16 | Disposition: A | Discharge status: Home / Self Care | Payer: 59 | Source: Ambulatory Visit | Attending: Adult Health | Admitting: Adult Health

## 2021-12-16 DIAGNOSIS — C7931 Secondary malignant neoplasm of brain: Secondary | ICD-10-CM | POA: Diagnosis not present

## 2021-12-16 MED ORDER — IOHEXOL 300 MG/ML  SOLN
100.0000 mL | Freq: Once | INTRAMUSCULAR | Status: AC | PRN
  Administered 2021-12-16: 100 mL via INTRAVENOUS

## 2021-12-16 MED ORDER — SODIUM CHLORIDE (PF) 0.9 % IJ SOLN
INTRAMUSCULAR | Status: AC
  Filled 2021-12-16: qty 50

## 2021-12-16 MED ORDER — HEPARIN SOD (PORK) LOCK FLUSH 100 UNIT/ML IV SOLN
500.0000 [IU] | Freq: Once | INTRAVENOUS | Status: AC
  Administered 2021-12-16: 500 [IU] via INTRAVENOUS

## 2021-12-16 MED ORDER — HEPARIN SOD (PORK) LOCK FLUSH 100 UNIT/ML IV SOLN
INTRAVENOUS | Status: AC
  Filled 2021-12-16: qty 5

## 2021-12-19 ENCOUNTER — Other Ambulatory Visit: Payer: Self-pay | Admitting: Hematology and Oncology

## 2021-12-21 ENCOUNTER — Other Ambulatory Visit: Payer: Self-pay | Admitting: Radiation Therapy

## 2021-12-21 DIAGNOSIS — C7931 Secondary malignant neoplasm of brain: Secondary | ICD-10-CM

## 2021-12-21 NOTE — [place] Notes (Signed)
Order placed for port access the day of brain MRI at [facility].   Mont Dutton R.T.(R)(T) Radiation Special Procedures Navigator

## 2021-12-22 ENCOUNTER — Other Ambulatory Visit (HOSPITAL_COMMUNITY): Payer: Self-pay

## 2021-12-22 MED ORDER — HYDROCOD POLI-CHLORPHE POLI ER 10-8 MG/5ML PO SUER
5.0000 mL | Freq: Two times a day (BID) | ORAL | 0 refills | Status: DC
  Filled 2021-12-22: qty 115, 12d supply, fill #0

## 2021-12-27 ENCOUNTER — Other Ambulatory Visit (HOSPITAL_COMMUNITY): Payer: Self-pay

## 2021-12-28 ENCOUNTER — Inpatient Hospital Stay: Payer: 59 | Attending: Medical

## 2021-12-28 ENCOUNTER — Other Ambulatory Visit: Payer: Self-pay

## 2021-12-28 ENCOUNTER — Other Ambulatory Visit (HOSPITAL_COMMUNITY): Payer: Self-pay

## 2021-12-28 VITALS — BP 111/64 | HR 81 | Temp 98.2°F | Resp 18 | Wt 94.2 lb

## 2021-12-28 DIAGNOSIS — Z5112 Encounter for antineoplastic immunotherapy: Secondary | ICD-10-CM | POA: Diagnosis present

## 2021-12-28 DIAGNOSIS — C50212 Malignant neoplasm of upper-inner quadrant of left female breast: Secondary | ICD-10-CM | POA: Insufficient documentation

## 2021-12-28 DIAGNOSIS — Z171 Estrogen receptor negative status [ER-]: Secondary | ICD-10-CM

## 2021-12-28 DIAGNOSIS — Z452 Encounter for adjustment and management of vascular access device: Secondary | ICD-10-CM | POA: Insufficient documentation

## 2021-12-28 DIAGNOSIS — C7931 Secondary malignant neoplasm of brain: Secondary | ICD-10-CM | POA: Insufficient documentation

## 2021-12-28 DIAGNOSIS — C7951 Secondary malignant neoplasm of bone: Secondary | ICD-10-CM | POA: Insufficient documentation

## 2021-12-28 DIAGNOSIS — Z170 Estrogen receptor positive status [ER+]: Secondary | ICD-10-CM | POA: Diagnosis not present

## 2021-12-28 DIAGNOSIS — Z95828 Presence of other vascular implants and grafts: Secondary | ICD-10-CM

## 2021-12-28 DIAGNOSIS — C787 Secondary malignant neoplasm of liver and intrahepatic bile duct: Secondary | ICD-10-CM | POA: Diagnosis present

## 2021-12-28 MED ORDER — HEPARIN SOD (PORK) LOCK FLUSH 100 UNIT/ML IV SOLN
500.0000 [IU] | Freq: Once | INTRAVENOUS | Status: AC | PRN
  Administered 2021-12-28: 500 [IU]

## 2021-12-28 MED ORDER — ALTEPLASE 2 MG IJ SOLR
2.0000 mg | Freq: Once | INTRAMUSCULAR | Status: AC | PRN
  Administered 2021-12-28: 2 mg
  Filled 2021-12-28: qty 2

## 2021-12-28 MED ORDER — TRASTUZUMAB-ANNS CHEMO 150 MG IV SOLR
6.0000 mg/kg | Freq: Once | INTRAVENOUS | Status: AC
  Administered 2021-12-28: 252 mg via INTRAVENOUS
  Filled 2021-12-28: qty 12

## 2021-12-28 MED ORDER — ACETAMINOPHEN 325 MG PO TABS
650.0000 mg | ORAL_TABLET | Freq: Once | ORAL | Status: AC
  Administered 2021-12-28: 650 mg via ORAL
  Filled 2021-12-28: qty 2

## 2021-12-28 MED ORDER — SODIUM CHLORIDE 0.9 % IV SOLN: Freq: Once | INTRAVENOUS | Status: AC

## 2021-12-28 MED ORDER — SODIUM CHLORIDE 0.9% FLUSH
10.0000 mL | INTRAVENOUS | Status: DC | PRN
  Administered 2021-12-28: 10 mL

## 2021-12-28 MED ORDER — DIPHENHYDRAMINE HCL 25 MG PO CAPS
25.0000 mg | ORAL_CAPSULE | Freq: Once | ORAL | Status: AC
  Administered 2021-12-28: 25 mg via ORAL
  Filled 2021-12-28: qty 1

## 2021-12-28 NOTE — Patient Instructions (Signed)
[facility]]   Discharge Instructions: Thank you for choosing [facility] to provide your oncology and hematology care.   If you have a lab appointment with the Cancer Center, please go directly to the Cancer Center and check in at the registration area.   Wear comfortable clothing and clothing appropriate for easy access to any Portacath or PICC line.   We strive to give you quality time with your provider. You may need to reschedule your appointment if you arrive late (15 or more minutes).  Arriving late affects you and other patients whose appointments are after yours.  Also, if you miss three or more appointments without notifying the office, you may be dismissed from the clinic at the provider's discretion.      For prescription refill requests, have your pharmacy contact our office and allow 72 hours for refills to be completed.    Today you received the following chemotherapy and/or immunotherapy agents: Trastuzumab (Herceptin)       To help prevent nausea and vomiting after your treatment, we encourage you to take your nausea medication as directed.  BELOW ARE SYMPTOMS THAT SHOULD BE REPORTED IMMEDIATELY: *FEVER GREATER THAN 100.4 F (38 C) OR HIGHER *CHILLS OR SWEATING *NAUSEA AND VOMITING THAT IS NOT CONTROLLED WITH YOUR NAUSEA MEDICATION *UNUSUAL SHORTNESS OF BREATH *UNUSUAL BRUISING OR BLEEDING *URINARY PROBLEMS (pain or burning when urinating, or frequent urination) *BOWEL PROBLEMS (unusual diarrhea, constipation, pain near the anus) TENDERNESS IN MOUTH AND THROAT WITH OR WITHOUT PRESENCE OF ULCERS (sore throat, sores in mouth, or a toothache) UNUSUAL RASH, SWELLING OR PAIN  UNUSUAL VAGINAL DISCHARGE OR ITCHING   Items with * indicate a potential emergency and should be followed up as soon as possible or go to the Emergency Department if any problems should occur.  Please show the CHEMOTHERAPY ALERT CARD or IMMUNOTHERAPY ALERT  CARD at check-in to the Emergency Department and triage nurse.  Should you have questions after your visit or need to cancel or reschedule your appointment, please contact [facility]  Dept: [phone]  and follow the prompts.  Office hours are 8:00 a.m. to 4:30 p.m. Monday - Friday. Please note that voicemails left after 4:00 p.m. may not be returned until the following business day.  We are closed weekends and major holidays. You have access to a nurse at all times for urgent questions. Please call the main number to the clinic Dept: [phone] and follow the prompts.   For any non-urgent questions, you may also contact your provider using MyChart. We now offer e-Visits for anyone 18 and older to request care online for non-urgent symptoms. For details visit [email or web address].   Also download the MyChart app! Go to the app store, search "MyChart", open the app, select [facility], and log in with your MyChart username and password.  Due to Covid, a mask is required upon entering the hospital/clinic. If you do not have a mask, one will be given to you upon arrival. For doctor visits, patients may have 1 support person aged 18 or older with them. For treatment visits, patients cannot have anyone with them due to current Covid guidelines and our immunocompromised population.  

## 2022-01-06 ENCOUNTER — Ambulatory Visit
Admission: RE | Admit: 2022-01-06 | Discharge: 2022-01-06 | Disposition: A | Discharge status: Home / Self Care | Payer: 59 | Source: Ambulatory Visit | Attending: Radiation Oncology | Admitting: Radiation Oncology

## 2022-01-06 DIAGNOSIS — C7931 Secondary malignant neoplasm of brain: Secondary | ICD-10-CM

## 2022-01-06 MED ORDER — SODIUM CHLORIDE 0.9% FLUSH
10.0000 mL | INTRAVENOUS | Status: DC | PRN
  Administered 2022-01-06: 10 mL via INTRAVENOUS

## 2022-01-06 MED ORDER — HEPARIN SOD (PORK) LOCK FLUSH 100 UNIT/ML IV SOLN
500.0000 [IU] | Freq: Once | INTRAVENOUS | Status: AC
  Administered 2022-01-06: 500 [IU] via INTRAVENOUS

## 2022-01-06 MED ORDER — GADOBENATE DIMEGLUMINE 529 MG/ML IV SOLN
8.0000 mL | Freq: Once | INTRAVENOUS | Status: AC | PRN
  Administered 2022-01-06: 8 mL via INTRAVENOUS

## 2022-01-10 ENCOUNTER — Ambulatory Visit (HOSPITAL_COMMUNITY)
Admission: RE | Admit: 2022-01-10 | Discharge: 2022-01-10 | Disposition: A | Discharge status: Home / Self Care | Payer: 59 | Source: Ambulatory Visit | Attending: Adult Health | Admitting: Adult Health

## 2022-01-10 ENCOUNTER — Encounter: Payer: Self-pay | Admitting: Urology

## 2022-01-10 DIAGNOSIS — F17200 Nicotine dependence, unspecified, uncomplicated: Secondary | ICD-10-CM | POA: Diagnosis not present

## 2022-01-10 DIAGNOSIS — Z0189 Encounter for other specified special examinations: Secondary | ICD-10-CM | POA: Diagnosis not present

## 2022-01-10 DIAGNOSIS — C7931 Secondary malignant neoplasm of brain: Secondary | ICD-10-CM | POA: Diagnosis not present

## 2022-01-10 LAB — ECHOCARDIOGRAM COMPLETE
AR max vel: 2.87 cm2
AV Peak grad: 5.3 mmHg
Ao pk vel: 1.15 m/s
Area-P 1/2: 4.24 cm2
Calc EF: 64 %
S' Lateral: 2.7 cm
Single Plane A2C EF: 64.5 %
Single Plane A4C EF: 63.8 %

## 2022-01-10 NOTE — [place] Notes (Signed)
Telephone appointment. I verified patient's identity and began nursing interview. No issues reported at this time.  Meaningful use complete.  Reminded patient of her 10:00am-01/12/22 telephone appointment w/ Ashlyn Bruning PA-C. I left my extension [phone] in case patient needs anything. Patient verbalized understanding.  Patient contact [phone]

## 2022-01-12 ENCOUNTER — Ambulatory Visit
Admission: RE | Admit: 2022-01-12 | Discharge: 2022-01-12 | Disposition: A | Discharge status: Home / Self Care | Payer: 59 | Source: Ambulatory Visit | Attending: Urology | Admitting: Urology

## 2022-01-12 ENCOUNTER — Other Ambulatory Visit: Payer: Self-pay

## 2022-01-12 DIAGNOSIS — C7931 Secondary malignant neoplasm of brain: Secondary | ICD-10-CM

## 2022-01-12 NOTE — Progress Notes (Signed)
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       ICD-10-CM   1. 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 [facility])  C79.31      Interval Since Last Radiation:  3 months 10/12/21: The 3 mm target in the right pons was treated to a prescription dose of 20 G in a single fraction.  06/29/21: These two targets were treated to a prescription dose of 20 G in a single fraction.   03/17/21: These three targets were treated to a prescription dose of 20 G in a single fraction.   08/27/20: These five targets were treated to a prescription dose of 20 G in a single fraction.    05/13/20:  SRS brain to the following 4 targets treated to a prescription dose of 20 Gy in a single fraction with a single isocenter:  PTV6 Mid Cerebellum 27mm PTV7 Lt Occipital 3mm PTV8 Lt Occipital 69mm PTV9 Lt Temporal 69mm   03/25/2019:  SRS brain//PTV 5:  Right frontal 5 mm target was treated to a prescription dose of 20 Gy in a single fraction.   09/04/2018:   SRS Brain// Right Frontal, 2 targets / 20 Gy in 1 fraction PTV3: Ant Rt Frontal 77mm 20Gy PTV4: Rt Frontal resection cavity 91mm  20Gy  12/18/2017: SRS brain//PTV2: 4 mm Rt Parietal lesion treated to 20 Gy in 1 Fx  08/25/2017, 08/28/2017, 08/30/2017, 09/01/2017, 09/04/2017: PTV1: post op SRS to right frontal lobe resection cavity in 5 fxs  Narrative:  In summary, she initially presented to the emergency Department on 07/27/2017 with complaints of headaches ongoing for approximately 5 weeks with associated nausea and vomiting. She was initially treated by her PCP for sinusitis without improvement.  She also has a history of migraine headaches and had ben evaluated in the ED  at [facility] on 06/20/2017 for treatment of what she thought was a typical migraine headache given the fact that she had her usual blurry vision and aura preceding the headache. She reports that the headache did improve with treatment but the relief was short-lived as the headache returned the very next day. She followed up with her primary care physician and a CT of the head was going to be scheduled due to the persistent headaches but in the interim, she presented to the Emergency Department at [facility] due to increased severity of the headache with associated nausea and vomiting. The headaches were occuring in the frontal lobe as well as bilateral temporal with occasional radiation to the occipital lobe and associated with blurry vision, decreased appetite, fatigue, nausea and vomiting.  She denied any difficulty with speech, memory, imbalance or focal weakness.   CT Head on admission 07/27/2017 showed a large right frontal lobe mass measuring 3.1 by 2.5 cm with significant surrounding edema with mass effect and right to left midline shift measuring 8 mm. A subsequent Brain MRI showed a 3.4 x 2.9 x 2.9 cm right frontal lobe mass with imaging characteristics of solitary metastasis and extensive vasogenic edema resulting in 9 mm right to left midline shift as well as equivocal very early left ventricle entrapment.     CT Chest, Abdomen,  Pelvis on 07/28/2017 for disease staging showed no findings to suggest metastatic breast cancer involving the chest, abdomen, pelvis or osseous structures. 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 Surgical changes involving the left breast and left axilla but no findings for local recurrence or regional adenopathy.  She proceeded with a gross total resection of the frontal lesion on 08/05/17 with Dr. Cyndy Freeze followed by adjuvant fractionated postop SRS radiotherapy to the resection cavity in February 2019. 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   She tolerated SRS treatment well and initial post treatment MRI brain on 12/07/17 showed satisfactory appearance of the right anterior frontal lobe post treatment site with expected evolution but unfortunately also showed a single, new 2 - 3 mm lesion in the right parietal lobe without associated edema or mass-effect.  She elected to undergo salvage SRS treatment to the new lesion in the parietal lobe which was completed on 12/18/17 and tolerated well.  A follow up brain MRI on 05/18/18 showed a stable right frontal resection cavity with stable 4 mm nodular enhancement at the inferior cavity margin but no evidence of new disease.  Unfortunately, the follow up MRI brain scan on 08/23/2018 showed a new 11 mm inferior right frontal lesion with only mild associated edema and no mass-effect.  She reported that she had been having more frequent frontal headaches which were not severe and responded to Aleve and otherwise, had been feeling well.  She elected to proceed with a single fraction of SRS to this new brain metastasis as recommended and this was completed on  09/04/18 and tolerated very well.   She had bronchoscopy with Dr. Valeta Harms in 05/2018 and dx'ed with Aspergillus- ID following (Dr. Graylon Good).  She also continues in routine follow up with Dr. Melvyn Novas in Pulmonology.  Follow up MRI brain scan from 03/08/19 showed continued interval enlargement of the nodular focus with enhancement at the superior margin of the right frontal resection cavity, measuring 5 x 6 mm in diameter compared with 3 x 4.5 mm on the previous study. Two smaller foci of enhancement along the inferior margin were unchanged and there was no evidence of increasing mass effect or edema and no new lesions seen elsewhere within the brain.  She elected to proceed with SRS treatment of the progressive nodular lesion which was  completed on 03/25/19 and tolerated well without any acute ill effects.    She developed systemic disease progression, particularly in the chest with increase in size of enhancing nodular left internal mammary soft tissue mass, lymph nodes and left third rib lesion and soft tissue lesion eroding the left sternal body as well as a new, hypermetabolic 6.5 cm liver lesion seen on PET scan from 05/23/2019 despite continuing on lapatinib with Letrozole. She did have an ultrasound-guided biopsy of the liver mass on 06/05/2019 which confirmed metastatic breast cancer, HER-2 positive, ER/PR negative. Her systemic therapy was changed to [place] beginning on 06/19/2019, under the care and direction of Dr. Lindi Adie which she tolerated very well.  A follow-up CT chest from 07/19/2019 showed a positive response to treatment with decrease in size of the soft tissue mass in the breast as well as bony lesions and adenopathy.  Fortunately, her follow-up MRI brain from 08/02/2019 showed stability of the previously treated disease and no new or progressive findings. A repeat MRI brain scan 11/04/19 was felt to be overall stable but  there was a slight change in a previously treated right inferior frontal lesion, measuring 5 mm, previously 4 mm, with a new, separate punctate focus of enhancement just posterior to that. This was such minimal change and when her prior treatment fields were fused with recent scan, it appeared that the new area of enhancement was on the field border of GTV3 and would have received a large portion of the delivered dose.  Therefore, consensus recommendation was to simply monitor this area with routine 3 month follow up brain imaging. She had repeat MRI on 01/30/20 and this showed overall disease stability with an unchanged appearance of the 2 previously treated right frontal lobe deposits and no new or progressive disease. Repeat systemic disease staging scans were performed on 01/29/20 and 02/04/20.  The bone scan  on 01/29/20 showed less uptake of tracer at the previously identified sternal and LEFT third rib lesions, consistent with response to therapy and there were no new sites of osseous metastasis noted. The CT C/A/P performed on 02/04/20 also showed disease stability with a new cluster of spiculated nodule or opacities in the posterior left lung favoring aspergillosis or other atypical infectious etiology.  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  There was no definite evidence of metastatic disease within the abdomen or pelvis. She continued on Kadcycla, s/p 14 cycles as of 05/05/20.   Repeat MRI brain from 05/01/20 showed 4 new metastatic lesions, all subcentimeter with a 47mm midline cerebellar vermian metastasis, 32mm left occipital metastasis, 48mm right occipital metastasis and a punctate lesion in the left temporal lobe.  The concensus recommendation was to proceed with a single fraction of SRS to the 4 new metastases which she was in agreement with and was completed on 05/13/21.  Her restaging systemic imaging from 05/28/20 was without evidence of any disease progression or recurrence in the chest, abdomen or pelvis.  At the time of her follow up with Dr. Lindi Adie on 05/05/2020, the decision was made to discontinue the Kadcycla, due to disease progression in the brain, and switch to Tucatinib, oral systemic therapy in combination with Herceptin and Xeloda which was started 06/11/20.  Her follow up brain MRI from 08/13/20 unfortunately showed disease progression with 5 new lesions: with a 2 mm left frontal lesion, 2 mm right frontal lesion, 2 mm right occipital lesion, 2 mm right temporal lesion and a 5 mm left temporal lesion.  The 1 mm right occipital lesion was initially thought to be new but on further review of prior treatment plans, it was noted to have been  treated previously and stable.  Otherwise, the other previously treated lesions were improved and/or stable.  The consensus recommendation at brain conference was to proceed with a single fraction of SRS to the 5 new brain lesions which she was in agreement with and was completed on 08/27/20.  She tolerated the treatment very well without any ill side effects aside from some low grade nausea for the first 48 hours.   In May 2022, her surveillance MRI showed no new lesions.  However, her follow up brain MRI on 03/05/21 showed 2 new brain mets and a 3rd enlarging brain met which has not previously been treated. She has been on hold for Tucatinib due to cutaneous reactions but fortunately, her restaging systemic imaging with CT C/A/P from 02/26/21 was without any evidence  of new or progressive disease in the chest and no evidence of metastatic disease involving the abdomen or pelvis. We met with her on 03/11/21 to review these findings and discuss the consensus recommendation from brain conference to proceed with a single fraction of SRS to the three new brain lesions which she was in agreement with and was completed on 03/17/21. A follow up MRI brain on 06/17/21 showed 2 new lesions in the left occipital measuring 5 mm and 4 mm.  Her imaging was reviewed in the multidisciplinary brain conference and again the consensus recommendation was to proceed with a single fraction of SRS to treat the 2 new lesions.  We met with her on 06/23/2021 she was in agreement to proceed and the treatment was completed on 06/29/2021. She tolerated the [facility] treatment well and had been off systemic therapy for a month in 06/2021 due to intolerance of GI side effects with neratinib (Nerlynx) in conjunction with herceptin.  Her disease restaging CT C/A/P performed on 07/22/2021 showed a retracted appearing, hypodense lesion adjacent to the gallbladder fossa, hepatic segment V measuring 1.7 x 1.6 cm but no other evidence of new metastatic disease in  the chest, abdomen or pelvis.  Fortunately, the PET scan on 08/13/2021 for further evaluation of the new lesion showed no specific findings to suggest recurrent FDG avid tumor or metastatic disease to the chest, abdomen or pelvis and specifically, no abnormal increased radiotracer uptake within the liver to correspond to the treated liver lesion adjacent to the gallbladder fossa.  At the time of her follow-up with Dr. Lindi Adie on 08/16/2021 the recommendation was to continue with Herceptin maintenance alone, as the only other option would be Margetuximab. Her most recent surveillance MRI brain from 09/29/21 was reviewed in our brain tumor conference on 10/04/21 along with review of her previous brain MRIs.  We also fused her current brain MRI with contrast with a composite radiation distribution representing all of her previous 8 courses of stereotactic radiosurgery.  Then, we reviewed the 5 lesions of concern on the recent MRI with 2 being described as new and 3 being described as larger.  Ultimately, it only appears that one 3 mm enhancing lesion on the right side of the pons with surrounding edema represents a true new brain metastasis.  The punctate lesion in the cerebellar vermis was previously treated with stereotactic radiosurgery.  We confirmed that the 3 larger lesions have all been treated with stereotactic radiosurgery within the past 2 years suggesting that the current enlargement may represent an adverse radiation effect such as radiation necrosis more likely than local recurrence. Therefore, the consensus recommendation was to proceed with a single fraction of salvage stereotactic radiosurgery [facility]) to the right pontine brain metastasis, especially given its critical location in the brain, followed by continued MRI surveillance of the other treated and untreated portions of the brain.    We met with her on 10/07/21 to discuss recommendations and she was in agreement to proceed. Her single fraction SRS was  completed 10/12/21 and tolerated very well as per usual.  Interval History: She tolerated her recent course of single fraction SRS on 10/12/2021 very well and did not experience any ill side effects. She remains without complaints aside from occasionally zings of pain randomly distributed and do not require pain medication. Her most recent MRI brain scan from 01/06/22 shows an interval increase in size of 3 lesions, including the most recently treated lesion in the right pons, but no new lesions.  We suspect that the lesions that have increased in size have been previously treated and this is most likely treatment related effect but we have requested our physics team fuse her previous treatment plans with this current MRI scan to confirm that these have indeed been previously treated.  We will plan to review her fused MRI scan at the multidisciplinary brain conference on Monday, 01/17/2022 for consensus recommendation.  We reviewed her scan and recommendations today by telephone.    On review of systems, the patient states that she is doing well overall.  She has fully recovered from a bout of pneumonia which required hospitalization overnight in 07/2021.  Currently, she is without complaints aside from fatigue and chronic cough.  She has seen Dr. 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  She continues in routine follow-up under the care and direction of Dr. Tommy Medal, here locally.  She denies headaches, decreased visual or auditory acuity, tinnitus, tremor, focal weakness or seizure activity.  She does have occasional blurry vision associated with her voriconazole treatment and this has not been progressive or changed recently.  She is not having difficulty with speech or word finding. She denies abdominal pain, N/V or diarrhea. She reports a healthy appetite  and has been able to regain some of the weight she had recently lost due to the intolerance of the systemic therapy with xeloda/herceptin/tucatanib.  She continues in routine follow-up under the care and direction of Dr. Lindi Adie for continued management of her systemic disease and is currently on herceptin maintenance only and is tolerating this well.  Her most recent PET for disease restaging on 08/12/2021 was without any findings to suggest recurrent or metastatic disease to the chest, abdomen or pelvis.  Recent restaging CT C/A/P from 12/16/21 shows disease stability without new or progressive findings to suggest any local recurrence or active metastatic disease in the chest, abdomen or pelvis.      ALLERGIES:  is allergic to aspirin, protonix [pantoprazole], doxycycline, promethazine-codeine, and iodinated contrast media.  Meds: Current Outpatient Medications  Medication Sig Dispense Refill   diphenhydrAMINE (BENADRYL) 50 MG tablet Take 1 tablet (50 mg total) by mouth once for 1 dose. Take Benadryl 50 mg at 7 am on 4/25 1 tablet 0   acetaminophen (TYLENOL) 325 MG tablet Take 2 tablets (650 mg total) by mouth every 6 (six) hours as needed for mild pain (or Fever >/= 101). 12 tablet 0   albuterol (VENTOLIN HFA) 108 (90 Base) MCG/ACT inhaler Inhale 2 puffs into the lungs every 6 (six) hours as needed for wheezing or shortness of breath (cough). 18 g 5   chlorpheniramine-HYDROcodone 10-8 MG/5ML Take 5 mLs by mouth 2 times daily. 115 mL 0   cholecalciferol (VITAMIN D3) 25 MCG (1000 UNIT) tablet Take 1,000 Units by mouth daily.     Fluticasone-Salmeterol,sensor, (AIRDUO DIGIHALER) 113-14 MCG/ACT AEPB Inhale 1 puff into the lungs 2 (two) times daily. 1 each 4   lidocaine-prilocaine (EMLA) cream APPLY EXTERNALLY TO THE AFFECTED AREA 1 TIME 30 g 3   Multiple Vitamins-Minerals (EMERGEN-C VITAMIN C PO) Take by mouth.     predniSONE (DELTASONE) 50 MG tablet Take prednisone 50 mg 13 hours, 7 hours, and 1 hour prior  to CT scan. 3 tablet 0   Vitamin E 200 units TABS Take by mouth.     voriconazole (VFEND) 200  MG tablet Take 1 tablet (200 mg total) by mouth 2 (two) times daily. 60 tablet 11   No current facility-administered medications for this encounter.   Facility-Administered Medications Ordered in Other Encounters  Medication Dose Route Frequency Provider Last Rate Last Admin   alteplase (CATHFLO ACTIVASE) injection 2 mg  2 mg Intracatheter Once PRN Nicholas Lose, MD       sodium chloride flush (NS) 0.9 % injection 10 mL  10 mL Intracatheter PRN Nicholas Lose, MD   10 mL at 07/06/21 1428    Physical Findings: Unable to assess due to telephone follow up visit format.  Lab Findings: Lab Results  Component Value Date   WBC 11.5 (H) 12/07/2021   WBC 8.7 07/26/2021   HGB 10.3 (L) 12/07/2021   HGB 13.1 08/12/2014   HCT 30.5 (L) 12/07/2021   HCT 40.4 08/12/2014   PLT 388 12/07/2021   PLT 228 08/12/2014    Lab Results  Component Value Date   NA 136 12/07/2021   NA 142 08/12/2014   K 4.2 12/07/2021   K 4.6 08/12/2014   CHLORIDE 107 08/12/2014   CO2 30 12/07/2021   CO2 26 08/12/2014   GLUCOSE 96 12/07/2021   GLUCOSE 94 08/12/2014   GLUCOSE 98 12/20/2012   BUN 16 12/07/2021   BUN 10.1 08/12/2014   CREATININE 0.79 12/07/2021   CREATININE 0.88 03/11/2019   CREATININE 0.9 08/12/2014   BILITOT 0.2 (L) 12/07/2021   BILITOT 0.25 08/12/2014   ALKPHOS 67 12/07/2021   ALKPHOS 68 08/12/2014   AST 17 12/07/2021   AST 15 08/12/2014   ALT 12 12/07/2021   ALT 11 08/12/2014   PROT 7.3 12/07/2021   PROT 7.1 08/12/2014   ALBUMIN 3.7 12/07/2021   ALBUMIN 3.8 08/12/2014   CALCIUM 9.6 12/07/2021   CALCIUM 9.3 08/12/2014   ANIONGAP 4 (L) 12/07/2021    Radiographic Findings: ECHOCARDIOGRAM COMPLETE  Result Date: 01/10/2022    ECHOCARDIOGRAM REPORT   Patient Name:   MOMO BRAUN [facility] [place] Date of Exam: 01/10/2022 Medical Rec #:  [id number]     Height:       61.0 in Accession #:    [id number]    Weight:        94.3 lb Date of Birth:  [birth year]     BSA:          1.372 m Patient Age:    76 years      BP:           111/64 mmHg Patient Gender: F             HR:           75 bpm. Exam Location:  Outpatient Procedure: 2D Echo, Cardiac Doppler, Color Doppler and Strain Analysis Indications:    Chemo  History:        Patient has prior history of Echocardiogram examinations. Risk                 Factors:Current Smoker.  Sonographer:    Jyl Heinz Referring Phys: [place]  1. Left ventricular ejection fraction, by estimation, is 60 to 65%. Left ventricular ejection fraction by 3D volume is 65 %. The left ventricle has normal function. The left ventricle has no regional wall motion abnormalities. Left ventricular diastolic  parameters are consistent with Grade I diastolic dysfunction (impaired relaxation). The average left ventricular global longitudinal strain is -22.2 %. The global longitudinal strain is normal.  2. Right ventricular systolic function  is normal. The right ventricular size is normal.  3. The mitral valve is normal in structure. Trivial mitral valve regurgitation. No evidence of mitral stenosis.  4. The aortic valve is normal in structure. Aortic valve regurgitation is not visualized. No aortic stenosis is present.  5. The inferior vena cava is normal in size with greater than 50% respiratory variability, suggesting right atrial pressure of 3 mmHg. Comparison(s): 08/05/21: GLS - 21.5%, no change. FINDINGS  Left Ventricle: Left ventricular ejection fraction, by estimation, is 60 to 65%. Left ventricular ejection fraction by 3D volume is 65 %. The left ventricle has normal function. The left ventricle has no regional wall motion abnormalities. The average left ventricular global longitudinal strain is -22.2 %. The global longitudinal strain is normal. The left ventricular internal cavity size was normal in size. There is no left ventricular hypertrophy. Left ventricular diastolic  parameters are consistent  with Grade I diastolic dysfunction (impaired relaxation). Right Ventricle: The right ventricular size is normal. No increase in right ventricular wall thickness. Right ventricular systolic function is normal. Left Atrium: Left atrial size was normal in size. Right Atrium: Right atrial size was normal in size. Pericardium: There is no evidence of pericardial effusion. Mitral Valve: The mitral valve is normal in structure. Trivial mitral valve regurgitation. No evidence of mitral valve stenosis. Tricuspid Valve: The tricuspid valve is normal in structure. Tricuspid valve regurgitation is not demonstrated. No evidence of tricuspid stenosis. Aortic Valve: The aortic valve is normal in structure. Aortic valve regurgitation is not visualized. No aortic stenosis is present. Aortic valve peak gradient measures 5.3 mmHg. Pulmonic Valve: The pulmonic valve was normal in structure. Pulmonic valve regurgitation is trivial. No evidence of pulmonic stenosis. Aorta: The aortic root is normal in size and structure. Venous: The inferior vena cava is normal in size with greater than 50% respiratory variability, suggesting right atrial pressure of 3 mmHg. IAS/Shunts: No atrial level shunt detected by color flow Doppler.  LEFT VENTRICLE PLAX 2D LVIDd:         4.30 cm         Diastology LVIDs:         2.70 cm         LV e' medial:    8.27 cm/s LV PW:         0.90 cm         LV E/e' medial:  7.8 LV IVS:        0.60 cm         LV e' lateral:   8.49 cm/s LVOT diam:     2.00 cm         LV E/e' lateral: 7.6 LV SV:         62 LV SV Index:   45              2D LVOT Area:     3.14 cm        Longitudinal                                Strain                                2D Strain GLS  -22.2 % LV Volumes (MOD)               Avg: LV vol d, MOD  69.6 ml A2C:                           3D Volume EF LV vol d, MOD    55.8 ml       LV 3D EF:    Left A4C:                                        ventricul LV vol s, MOD     24.7 ml                    ar A2C:                                        ejection LV vol s, MOD    20.2 ml                    fraction A4C:                                        by 3D LV SV MOD A2C:   44.9 ml                    volume is LV SV MOD A4C:   55.8 ml                    65 %. LV SV MOD BP:    41.4 ml                                 3D Volume EF:                                3D EF:        65 %                                LV EDV:       78 ml                                LV ESV:       28 ml                                LV SV:        51 ml RIGHT VENTRICLE             IVC RV Basal diam:  2.70 cm     IVC diam: 1.80 cm RV Mid diam:    2.10 cm RV S prime:     15.50 cm/s TAPSE (M-mode): 2.4 cm LEFT ATRIUM             Index        RIGHT ATRIUM          Index LA diam:        2.90 cm  2.11 cm/m   RA Area:     8.99 cm LA Vol (A2C):   19.3 ml 14.07 ml/m  RA Volume:   18.10 ml 13.19 ml/m LA Vol (A4C):   25.9 ml 18.88 ml/m LA Biplane Vol: 23.2 ml 16.91 ml/m  AORTIC VALVE AV Area (Vmax): 2.87 cm AV Vmax:        115.00 cm/s AV Peak Grad:   5.3 mmHg LVOT Vmax:      105.00 cm/s LVOT Vmean:     75.200 cm/s LVOT VTI:       0.198 m  AORTA Ao Root diam: 3.00 cm Ao Asc diam:  2.90 cm MITRAL VALVE MV Area (PHT): 4.24 cm    SHUNTS MV Decel Time: 179 msec    Systemic VTI:  0.20 m MV E velocity: 64.10 cm/s  Systemic Diam: 2.00 cm MV A velocity: 73.40 cm/s MV E/A ratio:  0.87 Candee Furbish MD Electronically signed by Candee Furbish MD Signature Date/Time: 01/10/2022/11:36:20 AM    Final    MR Brain W Wo Contrast  Result Date: 01/06/2022 CLINICAL DATA:  Brain metastases, assess treatment response 3T SRS Protocol EXAM: MRI HEAD WITHOUT AND WITH CONTRAST TECHNIQUE: Multiplanar, multiecho pulse sequences of the brain and surrounding structures were obtained without and with intravenous contrast. CONTRAST:  15mL MULTIHANCE GADOBENATE DIMEGLUMINE 529 MG/ML IV SOLN COMPARISON:  MRI September 29, 2021. FINDINGS: Brain: Increased  size of the left medial occipital/temporal lesion, now measuring 1.3 cm on series 11, image 68, previously 0.8 cm. Mildly increased surrounding edema. Increased size of a pontine lesion now measuring 0.6 cm on series 11, image 45, previously 0.3 cm. Mildly increased surrounding edema. Increased size of and anterior right temporal lobe lesion which measures up to 1.5 by 1.6 cm on series 11, image 47, previously 1.5 x 1.0 cm. There is increased surrounding edema. Otherwise, similar size of multiple supratentorial and infratentorial enhancing lesions which are annotated on series 11. Similar edema surrounding these lesions. No new lesions identified. No evidence of acute hemorrhage, acute infarct, midline shift, or hydrocephalus. Vascular: Major arterial flow voids are maintained at the skull base. Skull and upper cervical spine: Prior frontal craniotomy. Otherwise, normal marrow signal. Sinuses/Orbits: Clear sinuses.  No acute orbital findings. Other: No mastoid effusions. IMPRESSION: Interval progression of disease with interval increase in size of three lesions, described above. Additional lesions are unchanged. No new lesions identified. Electronically Signed   By: Margaretha Sheffield M.D.   On: 01/06/2022 13:54   CT CHEST ABDOMEN PELVIS W CONTRAST  Result Date: 12/17/2021 CLINICAL DATA:  History of metastatic breast cancer, assess treatment response. * Tracking Code: BO *. EXAM: CT CHEST, ABDOMEN, AND PELVIS WITH CONTRAST TECHNIQUE: Multidetector CT imaging of the chest, abdomen and pelvis was performed following the standard protocol during bolus administration of intravenous contrast. RADIATION DOSE REDUCTION: This exam was performed according to the departmental dose-optimization program which includes automated exposure control, adjustment of the mA and/or kV according to patient size and/or use of iterative reconstruction technique. CONTRAST:  150mL OMNIPAQUE IOHEXOL 300 MG/ML  SOLN COMPARISON:  Multiple  priors including PET-CT August 12, 2021 and CT chest abdomen and pelvis July 22, 2021 FINDINGS: CT CHEST FINDINGS Cardiovascular: Accessed right chest wall Port-A-Cath with tip near the superior cavoatrial junction. No central pulmonary embolus on this nondedicated study. Normal caliber thoracic aorta. Normal size heart. No significant pericardial effusion/thickening. Mediastinum/Nodes: Supraclavicular adenopathy. No suspicious thyroid nodule. No pathologically enlarged mediastinal, hilar or axillary lymph node. Lungs/Pleura: Similar  postoperative findings of left upper lobectomy. No significant interval change in the multiple generally thin walled, serpiginous interconnected cavitary lesions throughout the left lower lobe, some of which contain small air-fluid levels. Stable consolidation and nodularity in the dependent left lung base. No new suspicious pulmonary nodules or masses. No pleural effusion. No pneumothorax. Musculoskeletal: No chest wall mass. Stable subtle previously hypermetabolic left sternal body sclerotic lesion. No new aggressive lytic or blastic lesion of bone. CT ABDOMEN PELVIS FINDINGS Hepatobiliary: Similar appearance of the retracted hypodense metastatic lesion adjacent to the gallbladder fossa segment V measuring 1.7 x 1.5 cm on image 74/2 previously 1.7 x 1.6 cm. No new suspicious hepatic lesions. Gallbladder is unremarkable. No biliary ductal dilation. Pancreas: No pancreatic ductal dilation or evidence of acute inflammation. Spleen: No splenomegaly or focal splenic lesion. Adrenals/Urinary Tract: Adrenal glands are unremarkable. Kidneys are normal, without renal calculi, suspicious lesion, or hydronephrosis. Bladder is unremarkable. Stomach/Bowel: Radiopaque enteric contrast material traverses the ascending colon. Stomach is unremarkable for degree of distension. The appendix and terminal ileum appear normal. No evidence of acute bowel inflammation. Moderate volume of formed stool  throughout the colon. Vascular/Lymphatic: Normal caliber abdominal aorta. Aortic atherosclerosis. No pathologically enlarged abdominal or pelvic lymph nodes. Reproductive: Uterus and bilateral adnexa are unremarkable. Other: No significant abdominopelvic free fluid. Musculoskeletal: No aggressive lytic or blastic lesion of bone. IMPRESSION: 1. Stable examination without new or progressive findings to suggest local recurrence or active metastatic disease in the chest abdomen or pelvis. 2. No significant interval change in the multiple generally thin walled, serpiginous interconnected cavitary lesions throughout the left lower lobe, some of which contain small air-fluid levels. Stable consolidation and nodularity in the dependent left lung base. 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 3.  Aortic Atherosclerosis (ICD10-I70.0). Electronically Signed   By: Dahlia Bailiff M.D.   On: 12/17/2021 12:14    Impression/Plan: 84.         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    She appears to have recovered well from the effects of her recent SRS brain treatment and is currently without complaints.  We reviewed her most recent MRI brain scan from 01/06/22 which shows an interval increase in size of 3 lesions, including the most recently treated lesion in the right pons, but no new lesions.  We suspect that the lesions that have increased in size have been previously treated and this is most likely treatment related effect but we have requested our physics team fuse her previous treatment plans with this current MRI scan to confirm that these have indeed been previously treated.  We will plan to review her fused MRI scan at the multidisciplinary brain conference on Monday, 01/17/2022 for consensus recommendation and will contact her by telephone thereafter to review the results and recommendations from the  multidisciplinary brain and spine conference.  She knows that she is welcome to call at anytime in the interim with any questions or concerns.  She will also continue in routine follow-up under the care and direction of Dr. Lindi Adie for continued monitoring and management of her systemic disease.     Nicholos Johns, PA-C    Tyler Pita, MD  [facility] Direct Dial: [phone]  Fax: [phone] [email or web address]  Skype  LinkedIn

## 2022-01-17 ENCOUNTER — Inpatient Hospital Stay: Payer: 59 | Attending: Medical

## 2022-01-17 ENCOUNTER — Telehealth: Payer: Self-pay | Admitting: Radiation Therapy

## 2022-01-17 DIAGNOSIS — C787 Secondary malignant neoplasm of liver and intrahepatic bile duct: Secondary | ICD-10-CM | POA: Insufficient documentation

## 2022-01-17 DIAGNOSIS — C7931 Secondary malignant neoplasm of brain: Secondary | ICD-10-CM | POA: Insufficient documentation

## 2022-01-17 DIAGNOSIS — C50212 Malignant neoplasm of upper-inner quadrant of left female breast: Secondary | ICD-10-CM | POA: Insufficient documentation

## 2022-01-17 DIAGNOSIS — C7951 Secondary malignant neoplasm of bone: Secondary | ICD-10-CM | POA: Insufficient documentation

## 2022-01-17 DIAGNOSIS — Z5112 Encounter for antineoplastic immunotherapy: Secondary | ICD-10-CM | POA: Insufficient documentation

## 2022-01-17 NOTE — Telephone Encounter (Signed)
I called Mrs. Fedor to review the conference discussion from this morning. There are three targets that have slightly increased in size. A fusion with her previous treatment plans was completed and confirmed that these three targets have been treated.   Rt pons - treated in March 2023 Rt temporal - treated in September 2022 Lt occipital- treated in November 2021   The consensus was that no intervention is required at this time. We will continue with follow-up imaging in three months. Jovita reported that she is doing very well. No CNS issues or complaints at this time. She was happy to hear that no treatment is recommended and knows to call if anything changes.   Mont Dutton R.T.(R)(T) Radiation Special Procedures Navigator

## 2022-01-18 ENCOUNTER — Inpatient Hospital Stay: Payer: 59 | Admitting: Hematology and Oncology

## 2022-01-18 ENCOUNTER — Other Ambulatory Visit: Payer: Self-pay

## 2022-01-18 ENCOUNTER — Inpatient Hospital Stay: Payer: 59

## 2022-01-18 DIAGNOSIS — C50212 Malignant neoplasm of upper-inner quadrant of left female breast: Secondary | ICD-10-CM | POA: Diagnosis not present

## 2022-01-18 DIAGNOSIS — C787 Secondary malignant neoplasm of liver and intrahepatic bile duct: Secondary | ICD-10-CM | POA: Diagnosis present

## 2022-01-18 DIAGNOSIS — C7951 Secondary malignant neoplasm of bone: Secondary | ICD-10-CM | POA: Diagnosis not present

## 2022-01-18 DIAGNOSIS — Z95828 Presence of other vascular implants and grafts: Secondary | ICD-10-CM

## 2022-01-18 DIAGNOSIS — C7931 Secondary malignant neoplasm of brain: Secondary | ICD-10-CM | POA: Diagnosis not present

## 2022-01-18 DIAGNOSIS — Z5112 Encounter for antineoplastic immunotherapy: Secondary | ICD-10-CM | POA: Diagnosis not present

## 2022-01-18 DIAGNOSIS — Z171 Estrogen receptor negative status [ER-]: Secondary | ICD-10-CM | POA: Diagnosis not present

## 2022-01-18 LAB — CBC WITH DIFFERENTIAL (CANCER CENTER ONLY)
Abs Immature Granulocytes: 0.03 10*3/uL (ref 0.00–0.07)
Basophils Absolute: 0.1 10*3/uL (ref 0.0–0.1)
Basophils Relative: 1 %
Eosinophils Absolute: 0.8 10*3/uL — ABNORMAL HIGH (ref 0.0–0.5)
Eosinophils Relative: 7 %
HCT: 34.3 % — ABNORMAL LOW (ref 36.0–46.0)
Hemoglobin: 11.9 g/dL — ABNORMAL LOW (ref 12.0–15.0)
Immature Granulocytes: 0 %
Lymphocytes Relative: 17 %
Lymphs Abs: 1.8 10*3/uL (ref 0.7–4.0)
MCH: 31.1 pg (ref 26.0–34.0)
MCHC: 34.7 g/dL (ref 30.0–36.0)
MCV: 89.6 fL (ref 80.0–100.0)
Monocytes Absolute: 0.8 10*3/uL (ref 0.1–1.0)
Monocytes Relative: 7 %
Neutro Abs: 7.4 10*3/uL (ref 1.7–7.7)
Neutrophils Relative %: 68 %
Platelet Count: 342 10*3/uL (ref 150–400)
RBC: 3.83 MIL/uL — ABNORMAL LOW (ref 3.87–5.11)
RDW: 17.8 % — ABNORMAL HIGH (ref 11.5–15.5)
WBC Count: 10.9 10*3/uL — ABNORMAL HIGH (ref 4.0–10.5)
nRBC: 0 % (ref 0.0–0.2)

## 2022-01-18 LAB — CMP (CANCER CENTER ONLY)
ALT: 15 U/L (ref 0–44)
AST: 18 U/L (ref 15–41)
Albumin: 4 g/dL (ref 3.5–5.0)
Alkaline Phosphatase: 74 U/L (ref 38–126)
Anion gap: 5 (ref 5–15)
BUN: 13 mg/dL (ref 6–20)
CO2: 28 mmol/L (ref 22–32)
Calcium: 9.9 mg/dL (ref 8.9–10.3)
Chloride: 106 mmol/L (ref 98–111)
Creatinine: 0.75 mg/dL (ref 0.44–1.00)
GFR, Estimated: >60 mL/min (ref >60)
Glucose, Bld: 100 mg/dL — ABNORMAL HIGH (ref 70–99)
Potassium: 4.2 mmol/L (ref 3.5–5.1)
Sodium: 139 mmol/L (ref 135–145)
Total Bilirubin: 0.2 mg/dL — ABNORMAL LOW (ref 0.3–1.2)
Total Protein: 7.7 g/dL (ref 6.5–8.1)

## 2022-01-18 LAB — MAGNESIUM: Magnesium: 1.8 mg/dL (ref 1.7–2.4)

## 2022-01-18 MED ORDER — SODIUM CHLORIDE 0.9% FLUSH
10.0000 mL | INTRAVENOUS | Status: DC | PRN
  Administered 2022-01-18: 10 mL

## 2022-01-18 MED ORDER — HEPARIN SOD (PORK) LOCK FLUSH 100 UNIT/ML IV SOLN
500.0000 [IU] | Freq: Once | INTRAVENOUS | Status: AC | PRN
  Administered 2022-01-18: 500 [IU]

## 2022-01-18 MED ORDER — TRASTUZUMAB-ANNS CHEMO 150 MG IV SOLR
6.0000 mg/kg | Freq: Once | INTRAVENOUS | Status: AC
  Administered 2022-01-18: 252 mg via INTRAVENOUS
  Filled 2022-01-18: qty 12

## 2022-01-18 MED ORDER — ACETAMINOPHEN 325 MG PO TABS
650.0000 mg | ORAL_TABLET | Freq: Once | ORAL | Status: AC
  Administered 2022-01-18: 650 mg via ORAL
  Filled 2022-01-18: qty 2

## 2022-01-18 MED ORDER — SODIUM CHLORIDE 0.9 % IV SOLN: Freq: Once | INTRAVENOUS | Status: AC

## 2022-01-18 MED ORDER — DIPHENHYDRAMINE HCL 25 MG PO CAPS
25.0000 mg | ORAL_CAPSULE | Freq: Once | ORAL | Status: AC
  Administered 2022-01-18: 25 mg via ORAL
  Filled 2022-01-18: qty 1

## 2022-01-18 NOTE — Progress Notes (Signed)
Patient Care Team: Patient, [name] as PCP - General (General Practice) Domenick Gong, MD as Referring Physician (Emergency Medicine)  DIAGNOSIS:  Encounter Diagnosis  Name Primary?   Malignant neoplasm of upper-inner quadrant of left breast in female, estrogen receptor negative (HCC)     SUMMARY OF ONCOLOGIC HISTORY: Oncology History  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 Ki-67 was 70% HER-2 was amplified with a ratio 2.91   07/12/2012 - 07/17/2013 Neo-Adjuvant Chemotherapy   TCH 6 followed by Herceptin maintenance   12/11/2012 Surgery   Left breast lumpectomy: 1.8 cm tumor 1 positive sentinel node, axillary lymph node dissection 02/08/2013 showed 0/13 lymph nodes   03/25/2013 - 05/06/2013 Radiation Therapy   Adjuvant radiation therapy   06/05/2013 - 07/20/2017 Anti-estrogen oral therapy   Tamoxifen 20 mg daily   07/27/2017 Relapse/Recurrence   MRI Brain: 3.4 x 2.9 x 2.9 cm RIGHT frontal lobe mass with imaging characteristics of solitary metastasis. Extensive vasogenic edema resulting in 9 mm RIGHT to LEFT midline shift. Equivocal very early LEFT ventricle entrapment.    08/04/2017 Surgery   Rt frontal brain resection: Poorly differentiated tumor IHC suggests breast primary ER and PR Positive   08/25/2017 - 09/04/2017 Radiation Therapy   Stereotactic radiation   09/18/2017 -  Anti-estrogen oral therapy   Lapatinib with letrozole   12/18/2017 - 12/19/2017 Radiation Therapy   New right parietal lobe metastases status post [facility]   08/27/2018 - 08/27/2020 Radiation Therapy   SRS to new brain metastases   05/09/2019 Relapse/Recurrence   Interval increase in size of the enhancing nodular left internal mammary soft tissue 2.8 cm.  Redemonstrated  enlarged supraclavicular, lower cervical and lower posterior cervical nodes unchanged.  Interval increase in the bony erosion of the posterior and lateral left third rib, increasing soft tissue lesion eroding the left sternal body 3.1 cm was 2.5 cm.  Bronchiectatic changes   06/19/2019 - 05/05/2020 Chemotherapy   ado-trastuzumab emtansine (KADCYLA)     06/15/2020 -  Chemotherapy   Xeloda, Tucatinib, Herceptin    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/Initial Ca 1997, Brain Mets 2019  06/10/2019 Initial Diagnosis   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[facility])   06/19/2019 - 05/05/2020 Chemotherapy   ado-trastuzumab emtansine (KADCYLA)     Port-A-Cath in place    CHIEF COMPLIANT: metastatic breast cancer currently on Herceptin    INTERVAL HISTORY: Melissa [name] is a 56 y.o. with the above-mentioned history of metastatic breast cancer currently on treatment with Xeloda and Herceptin. She presents to the clinic today for follow-up. She states that she has been feeling great. She  excited abut the results she received yesterday on her birthday, no more tumors. She  feels better since she has been taking [place], mushrooms, [place] tail and other vitamins that she has been taking since February. She has more energy and her appetite has gotten better.   ALLERGIES:  is allergic to aspirin, protonix [pantoprazole], doxycycline, promethazine-codeine, and iodinated contrast media.  MEDICATIONS:  Current Outpatient Medications  Medication Sig Dispense Refill   acetaminophen (TYLENOL) 325 MG tablet Take 2 tablets (650 mg total) by mouth every 6 (six) hours as needed for mild pain (or Fever >/= 101). 12 tablet 0   albuterol (VENTOLIN HFA) 108 (90  Base) MCG/ACT inhaler Inhale 2 puffs into the lungs every 6 (six) hours as needed for wheezing or shortness of breath (cough). 18 g 5   chlorpheniramine-HYDROcodone 10-8 MG/5ML Take 5 mLs by mouth 2 times daily. 115 mL 0   cholecalciferol (VITAMIN  D3) 25 MCG (1000 UNIT) tablet Take 1,000 Units by mouth daily.     diphenhydrAMINE (BENADRYL) 50 MG tablet Take 1 tablet (50 mg total) by mouth once for 1 dose. Take Benadryl 50 mg at 7 am on 4/25 1 tablet 0   Fluticasone-Salmeterol,sensor, (AIRDUO DIGIHALER) 113-14 MCG/ACT AEPB Inhale 1 puff into the lungs 2 (two) times daily. 1 each 4   lidocaine-prilocaine (EMLA) cream APPLY EXTERNALLY TO THE AFFECTED AREA 1 TIME 30 g 3   Multiple Vitamins-Minerals (EMERGEN-C VITAMIN C PO) Take by mouth.     predniSONE (DELTASONE) 50 MG tablet Take prednisone 50 mg 13 hours, 7 hours, and 1 hour prior to CT scan. 3 tablet 0   Vitamin E 200 units TABS Take by mouth.     voriconazole (VFEND) 200 MG tablet Take 1 tablet (200 mg total) by mouth 2 (two) times daily. 60 tablet 11   No current facility-administered medications for this visit.   Facility-Administered Medications Ordered in Other Visits  Medication Dose Route Frequency Provider Last Rate Last Admin   alteplase (CATHFLO ACTIVASE) injection 2 mg  2 mg Intracatheter Once PRN Nicholas Lose, MD       sodium chloride flush (NS) 0.9 % injection 10 mL  10 mL Intracatheter PRN Nicholas Lose, MD   10 mL at 07/06/21 1428    PHYSICAL EXAMINATION: ECOG PERFORMANCE STATUS: 1 - Symptomatic but completely ambulatory  Vitals:   01/18/22 1042  BP: 120/64  Pulse: 89  Resp: 18  Temp: 97.7 F (36.5 C)  SpO2: 98%   Filed Weights   01/18/22 1042  Weight: 98 lb 3.2 oz (44.5 kg)      LABORATORY DATA:  I have reviewed the data as listed    Latest Ref Rng & Units 12/07/2021   12:31 PM 10/26/2021    9:11 AM 09/14/2021    8:18 AM  CMP  Glucose 70 - 99 mg/dL 96  70  75   BUN 6 - 20 mg/dL $Remove'16  15  13   'NyuyREM$ Creatinine 0.44 - 1.00 mg/dL 0.79  0.90  0.82   Sodium 135 - 145 mmol/L 136  138  138   Potassium 3.5 - 5.1 mmol/L 4.2  4.5  3.8   Chloride 98 - 111 mmol/L 102  105  103   CO2 22 - 32 mmol/L $RemoveB'30  28  28   'ZrnRWbRi$ Calcium 8.9 - 10.3 mg/dL 9.6  9.8  9.9   Total Protein  6.5 - 8.1 g/dL 7.3  8.0  8.2   Total Bilirubin 0.3 - 1.2 mg/dL 0.2  0.2  0.2   Alkaline Phos 38 - 126 U/L 67  74  82   AST 15 - 41 U/L $Remo'17  20  19   'eYjUG$ ALT 0 - 44 U/L $Remo'12  17  16     'iDEYm$ Lab Results  Component Value Date   WBC 11.5 (H) 12/07/2021   HGB 10.3 (L) 12/07/2021   HCT 30.5 (L) 12/07/2021   MCV 88.2 12/07/2021   PLT 388 12/07/2021   NEUTROABS 7.8 (H) 12/07/2021    ASSESSMENT & PLAN:  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 ---s/p Lumpectomy/ /Initial Ca 1997, Brain Mets 2019  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  [place] brain: 08/25/2017-09/04/2017 2. Anti Her 2 therapy with Lapatinib started 09/17/2017-05/14/2019: Stopped for progression 3.  I discontinued tamoxifen and started her on letrozole 2.5 mg daily.  05/14/2019 stopped for progression 4.  Stereotactic radiosurgery 12/19/2017 to the new right parietal lobe metastases. 5.  Kadcyla: Received 16 cycles discontinued 05/05/2020 6.  Tucatinib Xeloda: Discontinued because of hand-foot syndrome 06/11/2020-12/15/2020 7. Herceptin/Neratinib: unable to tolerate due to diarrhea and weight loss 8.  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  The spot on the liver noted on the CT scan did not have any activity on the PET scan.  Postinflammatory changes in the lungs chronic sequelae of atypical infection   Brain MRI 10/01/2021: Progression of disease.  2 new lesions, total of 9 enhancing brain metastases, SRS completed by Dr. Tammi Klippel   Based on this we decided to continue with Herceptin maintenance alone.  The only other option would be Margetuximab.   Natural supplements: Patient is taking [place] tail mushroom, [employer], fenbendazole, multiple vitamins since February 2023.  Since then she has been feeling remarkably better and energy levels are excellent.  Return to clinic every 3 weeks for Herceptin every 6 weeks to follow-up with me with labs through the port. Her next scans will be done in December 2023.    No orders of the defined types were placed in this encounter.  The patient has a good understanding of the overall plan. she agrees with it. she will call with any problems that may develop before the next visit here. Total time spent: 30 mins including face to face time and time spent for planning, charting and co-ordination of care   Harriette Ohara, MD 01/18/22    I Gardiner Coins am scribing for Dr. Lindi Adie  I have reviewed the above documentation for accuracy and completeness, and I agree with the above.

## 2022-01-18 NOTE — Assessment & Plan Note (Addendum)
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 Anti Her 2 therapy with Lapatinibstarted 09/17/2017-05/14/2019: Stopped for progression 3.I discontinuedtamoxifen and started her on letrozole 2.5 mg daily.05/14/2019 stopped for progression 4.Stereotactic radiosurgery 12/19/2017 to the new right parietal lobe metastases. 5.Kadcyla: Received 16 cycles discontinued 05/05/2020 6.Tucatinib Xeloda: Discontinued because of hand-foot syndrome 06/11/2020-12/15/2020 7.Herceptin/Neratinib: unable to tolerate due to diarrhea and weight loss 8.  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 The spot on the liver noted on the CT scan did not have any activity on the PET scan. Postinflammatory  changes in the lungs chronic sequelae of atypical infection  Brain MRI 10/01/2021: Progression of disease.  2 new lesions, total of 9 enhancing brain metastases, SRS completed by Dr. Tammi Klippel  Based on this we decided to continue with Herceptin maintenance alone. The only other option would be Margetuximab.   Natural supplements: Patient is taking [place] tail mushroom, [employer], fenbendazole, multiple vitamins since February 2023.  Since then she has been feeling remarkably better and energy levels are excellent.  Return to clinic every 3 weeks for Herceptin every 6 weeks to follow-up with me with labs through the port. Her next scans will be done in December 2023.

## 2022-01-18 NOTE — Patient Instructions (Signed)
[facility]  Discharge Instructions: Thank you for choosing [facility] to provide your oncology and hematology care.   If you have a lab appointment with the [facility], please go directly to the [facility] and check in at the registration area.   Wear comfortable clothing and clothing appropriate for easy access to any Portacath or PICC line.   We strive to give you quality time with your provider. You may need to reschedule your appointment if you arrive late (15 or more minutes).  Arriving late affects you and other patients whose appointments are after yours.  Also, if you miss three or more appointments without notifying the office, you may be dismissed from the clinic at the provider's discretion.      For prescription refill requests, have your pharmacy contact our office and allow 72 hours for refills to be completed.    Today you received the following chemotherapy and/or immunotherapy agents: Kanjinti      To help prevent nausea and vomiting after your treatment, we encourage you to take your nausea medication as directed.  BELOW ARE SYMPTOMS THAT SHOULD BE REPORTED IMMEDIATELY: *FEVER GREATER THAN 100.4 F (38 C) OR HIGHER *CHILLS OR SWEATING *NAUSEA AND VOMITING THAT IS NOT CONTROLLED WITH YOUR NAUSEA MEDICATION *UNUSUAL SHORTNESS OF BREATH *UNUSUAL BRUISING OR BLEEDING *URINARY PROBLEMS (pain or burning when urinating, or frequent urination) *BOWEL PROBLEMS (unusual diarrhea, constipation, pain near the anus) TENDERNESS IN MOUTH AND THROAT WITH OR WITHOUT PRESENCE OF ULCERS (sore throat, sores in mouth, or a toothache) UNUSUAL RASH, SWELLING OR PAIN  UNUSUAL VAGINAL DISCHARGE OR ITCHING   Items with * indicate a potential emergency and should be followed up as soon as possible or go to the Emergency Department if any problems should occur.  Please show the CHEMOTHERAPY ALERT CARD or IMMUNOTHERAPY ALERT CARD at check-in to  the Emergency Department and triage nurse.  Should you have questions after your visit or need to cancel or reschedule your appointment, please contact [facility]  Dept: [phone]  and follow the prompts.  Office hours are 8:00 a.m. to 4:30 p.m. Monday - Friday. Please note that voicemails left after 4:00 p.m. may not be returned until the following business day.  We are closed weekends and major holidays. You have access to a nurse at all times for urgent questions. Please call the main number to the clinic Dept: [phone] and follow the prompts.   For any non-urgent questions, you may also contact your provider using MyChart. We now offer e-Visits for anyone 77 and older to request care online for non-urgent symptoms. For details visit [email or web address].   Also download the MyChart app! Go to the app store, search "MyChart", open the app, select [facility], and log in with your MyChart username and password.  Masks are optional in the cancer centers. If you would like for your care team to wear a mask while they are taking care of you, please let them know. For doctor visits, patients may have with them one support person who is at least 54 years old. At this time, visitors are not allowed in the infusion area.

## 2022-01-20 ENCOUNTER — Telehealth: Payer: Self-pay | Admitting: Hematology and Oncology

## 2022-01-20 NOTE — Telephone Encounter (Signed)
Scheduled appointment per 7/11 los. Patient is aware.

## 2022-01-24 ENCOUNTER — Other Ambulatory Visit: Payer: Self-pay | Admitting: Hematology and Oncology

## 2022-01-25 ENCOUNTER — Other Ambulatory Visit (HOSPITAL_COMMUNITY): Payer: Self-pay

## 2022-01-25 MED ORDER — HYDROCOD POLI-CHLORPHE POLI ER 10-8 MG/5ML PO SUER
5.0000 mL | Freq: Two times a day (BID) | ORAL | 0 refills | Status: DC
  Filled 2022-01-25: qty 115, 12d supply, fill #0

## 2022-01-26 ENCOUNTER — Other Ambulatory Visit (HOSPITAL_COMMUNITY): Payer: Self-pay

## 2022-01-31 ENCOUNTER — Other Ambulatory Visit: Payer: Self-pay

## 2022-02-02 ENCOUNTER — Other Ambulatory Visit: Payer: Self-pay

## 2022-02-08 ENCOUNTER — Other Ambulatory Visit: Payer: Self-pay

## 2022-02-08 ENCOUNTER — Inpatient Hospital Stay: Payer: 59 | Admitting: Dietician

## 2022-02-08 ENCOUNTER — Inpatient Hospital Stay: Payer: 59 | Attending: Medical

## 2022-02-08 VITALS — BP 115/68 | HR 80 | Temp 97.7°F | Resp 17 | Wt 100.2 lb

## 2022-02-08 DIAGNOSIS — C787 Secondary malignant neoplasm of liver and intrahepatic bile duct: Secondary | ICD-10-CM | POA: Diagnosis present

## 2022-02-08 DIAGNOSIS — C7951 Secondary malignant neoplasm of bone: Secondary | ICD-10-CM | POA: Insufficient documentation

## 2022-02-08 DIAGNOSIS — C50212 Malignant neoplasm of upper-inner quadrant of left female breast: Secondary | ICD-10-CM | POA: Diagnosis present

## 2022-02-08 DIAGNOSIS — C7931 Secondary malignant neoplasm of brain: Secondary | ICD-10-CM | POA: Diagnosis not present

## 2022-02-08 DIAGNOSIS — Z5112 Encounter for antineoplastic immunotherapy: Secondary | ICD-10-CM | POA: Diagnosis present

## 2022-02-08 MED ORDER — ACETAMINOPHEN 325 MG PO TABS
650.0000 mg | ORAL_TABLET | Freq: Once | ORAL | Status: AC
  Administered 2022-02-08: 650 mg via ORAL
  Filled 2022-02-08: qty 2

## 2022-02-08 MED ORDER — SODIUM CHLORIDE 0.9% FLUSH
10.0000 mL | INTRAVENOUS | Status: DC | PRN
  Administered 2022-02-08: 10 mL

## 2022-02-08 MED ORDER — TRASTUZUMAB-ANNS CHEMO 150 MG IV SOLR
6.0000 mg/kg | Freq: Once | INTRAVENOUS | Status: AC
  Administered 2022-02-08: 252 mg via INTRAVENOUS
  Filled 2022-02-08: qty 12

## 2022-02-08 MED ORDER — DIPHENHYDRAMINE HCL 25 MG PO CAPS
25.0000 mg | ORAL_CAPSULE | Freq: Once | ORAL | Status: AC
  Administered 2022-02-08: 25 mg via ORAL
  Filled 2022-02-08: qty 1

## 2022-02-08 MED ORDER — SODIUM CHLORIDE 0.9 % IV SOLN: Freq: Once | INTRAVENOUS | Status: AC

## 2022-02-08 MED ORDER — HEPARIN SOD (PORK) LOCK FLUSH 100 UNIT/ML IV SOLN
500.0000 [IU] | Freq: Once | INTRAVENOUS | Status: AC | PRN
  Administered 2022-02-08: 500 [IU]

## 2022-02-08 NOTE — Progress Notes (Signed)
Nutrition Follow-up:  Patient with metastatic breast cancer. She is receiving trastuzumab q21d.   Met with patient in infusion. She reports feeling "great." Patient has a good appetite, eating 3 meals + bedtime snack (bowl of ice cream). She is drinking 1-2 [employer] 1.4 supplements for added calories and protein. Patient reports good energy level with increased activity, recalls currently working on decorating bird house that her father built. She denies nutrition impact symptoms. Patient reports supplement regimen ([place] tail, vit E, vit D, vit C, apricot seed, fenbendazole, tudca) that she has been taking for the last 6 months appears to have beneficial effects. Patient states last brain scan did not show any lesions where previously there were two. Patient has discussed this with MD.   Medications: reviewed   Labs: 7/11 labs reviewed   Anthropometrics: Weight 100 lb 4 oz today increased   NUTRITION DIAGNOSIS: Inadequate oral intake resolved    INTERVENTION:  Continue 3 meals + bedtime snack Continue [employer] 1.4 twice daily   MONITORING, EVALUATION, GOAL: weight trends, intake   NEXT VISIT: To be scheduled as needed

## 2022-02-08 NOTE — Patient Instructions (Signed)
[facility]  Discharge Instructions: Thank you for choosing [facility] to provide your oncology and hematology care.   If you have a lab appointment with the Cancer Center, please go directly to the Cancer Center and check in at the registration area.   Wear comfortable clothing and clothing appropriate for easy access to any Portacath or PICC line.   We strive to give you quality time with your provider. You may need to reschedule your appointment if you arrive late (15 or more minutes).  Arriving late affects you and other patients whose appointments are after yours.  Also, if you miss three or more appointments without notifying the office, you may be dismissed from the clinic at the provider's discretion.      For prescription refill requests, have your pharmacy contact our office and allow 72 hours for refills to be completed.    Today you received the following chemotherapy and/or immunotherapy agents: Trastuzumab.       To help prevent nausea and vomiting after your treatment, we encourage you to take your nausea medication as directed.  BELOW ARE SYMPTOMS THAT SHOULD BE REPORTED IMMEDIATELY: *FEVER GREATER THAN 100.4 F (38 C) OR HIGHER *CHILLS OR SWEATING *NAUSEA AND VOMITING THAT IS NOT CONTROLLED WITH YOUR NAUSEA MEDICATION *UNUSUAL SHORTNESS OF BREATH *UNUSUAL BRUISING OR BLEEDING *URINARY PROBLEMS (pain or burning when urinating, or frequent urination) *BOWEL PROBLEMS (unusual diarrhea, constipation, pain near the anus) TENDERNESS IN MOUTH AND THROAT WITH OR WITHOUT PRESENCE OF ULCERS (sore throat, sores in mouth, or a toothache) UNUSUAL RASH, SWELLING OR PAIN  UNUSUAL VAGINAL DISCHARGE OR ITCHING   Items with * indicate a potential emergency and should be followed up as soon as possible or go to the Emergency Department if any problems should occur.  Please show the CHEMOTHERAPY ALERT CARD or IMMUNOTHERAPY ALERT CARD at check-in  to the Emergency Department and triage nurse.  Should you have questions after your visit or need to cancel or reschedule your appointment, please contact [facility]  Dept: [phone]  and follow the prompts.  Office hours are 8:00 a.m. to 4:30 p.m. Monday - Friday. Please note that voicemails left after 4:00 p.m. may not be returned until the following business day.  We are closed weekends and major holidays. You have access to a nurse at all times for urgent questions. Please call the main number to the clinic Dept: [phone] and follow the prompts.   For any non-urgent questions, you may also contact your provider using MyChart. We now offer e-Visits for anyone 18 and older to request care online for non-urgent symptoms. For details visit [email or web address].   Also download the MyChart app! Go to the app store, search "MyChart", open the app, select , and log in with your MyChart username and password.  Masks are optional in the cancer centers. If you would like for your care team to wear a mask while they are taking care of you, please let them know. You may have one support person who is at least 54 years old accompany you for your appointments. 

## 2022-02-10 ENCOUNTER — Other Ambulatory Visit: Payer: Self-pay

## 2022-02-14 ENCOUNTER — Telehealth: Payer: Self-pay | Admitting: Hematology and Oncology

## 2022-02-14 NOTE — Telephone Encounter (Signed)
Per 8/7 phone line pt called to r/s appointment  appointment r/s per pt request

## 2022-02-16 ENCOUNTER — Other Ambulatory Visit: Payer: Self-pay | Admitting: Hematology and Oncology

## 2022-02-16 DIAGNOSIS — Z1231 Encounter for screening mammogram for malignant neoplasm of breast: Secondary | ICD-10-CM

## 2022-02-17 ENCOUNTER — Other Ambulatory Visit: Payer: Self-pay

## 2022-02-21 ENCOUNTER — Other Ambulatory Visit: Payer: Self-pay | Admitting: Hematology and Oncology

## 2022-02-22 ENCOUNTER — Other Ambulatory Visit (HOSPITAL_COMMUNITY): Payer: Self-pay

## 2022-02-22 MED ORDER — HYDROCOD POLI-CHLORPHE POLI ER 10-8 MG/5ML PO SUER
5.0000 mL | Freq: Two times a day (BID) | ORAL | 0 refills | Status: DC
  Filled 2022-02-22: qty 115, 12d supply, fill #0

## 2022-02-23 ENCOUNTER — Ambulatory Visit (INDEPENDENT_AMBULATORY_CARE_PROVIDER_SITE_OTHER): Payer: 59 | Admitting: Infectious Disease

## 2022-02-23 ENCOUNTER — Encounter: Payer: Self-pay | Admitting: Infectious Disease

## 2022-02-23 ENCOUNTER — Other Ambulatory Visit: Payer: Self-pay

## 2022-02-23 VITALS — BP 112/71 | HR 74 | Resp 16 | Ht 61.0 in | Wt 101.4 lb

## 2022-02-23 DIAGNOSIS — Z171 Estrogen receptor negative status [ER-]: Secondary | ICD-10-CM

## 2022-02-23 DIAGNOSIS — C50212 Malignant neoplasm of upper-inner quadrant of left female breast: Secondary | ICD-10-CM | POA: Diagnosis not present

## 2022-02-23 DIAGNOSIS — F17200 Nicotine dependence, unspecified, uncomplicated: Secondary | ICD-10-CM

## 2022-02-23 DIAGNOSIS — Z8619 Personal history of other infectious and parasitic diseases: Secondary | ICD-10-CM | POA: Diagnosis not present

## 2022-02-23 DIAGNOSIS — B449 Aspergillosis, unspecified: Secondary | ICD-10-CM

## 2022-02-23 DIAGNOSIS — B441 Other pulmonary aspergillosis: Secondary | ICD-10-CM | POA: Diagnosis not present

## 2022-02-23 DIAGNOSIS — B479 Mycetoma, unspecified: Secondary | ICD-10-CM | POA: Diagnosis not present

## 2022-02-23 MED ORDER — VORICONAZOLE 200 MG PO TABS: 200.0000 mg | ORAL_TABLET | Freq: Two times a day (BID) | ORAL | 11 refills | Status: DC

## 2022-02-23 NOTE — Progress Notes (Signed)
Subjective:  Chief complaint: Follow-up for mycetoma    Patient ID: Melissa [name], female    DOB: 1965/09/15, 56 y.o.   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  Along the way she was diagnosed with breast cancer 2013 underwent lumpectomy and chemotherapy.  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  She is following with Dr. Lindi Adie with oncology.  She is subsequent been found to have other metastases including to her sternum and liver.  She developed cavitary lung infection and underwent bronchoscopy which revealed Aspergillus fumigatus.  She was on Cresemba since then.  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  She also has some spiculated lesions in the posterior left lobe lobe of her lung.  Radiology is concerned that the apparent mycetoma is due to Aspergillus or another mold.  There was similar concerned that the spiculated areas could represent Aspergillus though we have no culture data to guide [name].  She continued on Cresemba at that point  She then had bronchoscopy with BAL.  Fungal cultures from the bronchoscopy have been negative though the patient was on Cresemba at the time.  There is 1 AFB culture that is positive but not for tuberculosis.  Since then apparently (and I had to go to [employer] on web (not available on epic) they stated that Mycobacteria could NOT grow on subculture so no ID  In the interim the patient's insurance had denied Cresemba and she has been changed to voriconazole.  Unfortunately she has been  found to have metastases of her breast cancer to the brain.  She  underwent stereotactic radiation to these lesions by Dr. London Pepper  Her chemotherapeutic regimen previously included Tukysa (tucatinib) which can elevate voriconazole levels and potentially cause voriconazole toxicity including QT prolongation.  We checjked her voriconazole levels and they have been in the therapeutic range recently  EKG showed:  her EKG showed a QTC of 448 with a QTC of 465 ms last EKG that I could find in the computer that I could actually read was in 2019 in which she had a QTC of 442.   She did tell us that she is coughing less since being on voriconazole.   .  She had MRI of the brain done this February 2022 showed decrease in size of 3 lesions but multiple new lesions.  Her regimen was Xeloda Herceptin and Tucatinib  I last saw her she was developing a rash on her hands which worsened as well as on her lips.  Ultimately her oncologist Dr. Lindi Adie stopped her Xeloda and Tucatinib and she is on Herceptin alone.   In the interim she then had CT chest abdomen pelvis.  From an infectious disease standpoint there is increase in her cavitation which is not altogether surprising and there were areas of increased nodularity and specially in the left lower lung.  She was bothered by this and sought second opinion at [facility] and see by Dr. Gaylyn Lambert who carefully reviewed her case and felt that there was no need for  further aggressive inventions to work this up nor need to change antifungal therapy.  She also had an MRI of the brain that showed new lesions.  She was seen by Stereotactic Radiosurgery to 3 lesions on March 17 2021.  She started new chemotherapy in the form of neratinib whose drug concentrations are increased by voriconazole.  However Dr Lindi Adie started at a lower dose which they have tried to escalate.  She told me she took 3 pills without difficulty but when she went  to 5 pills she had severe diarrhea and had to be given IV fluids for rehydration.    She is now off of neratinib but still on herceptin  He did again have stereotactic surgery for brain metastasis with Dr. Reatha Armour in December 2000 2022  She had a CT of the chest performed in January 2023:   This did not show PE as it was a CT angiogram did show increased opacity in left lung base with some minimal effusion with multiple serpiginous cystic lesions that were stable.  Also extensive emphysematous changes seen.   Shortly after getting her CT scan she developed neck pain chest pain which she seemed to be pleurisy to her and was ultimately mid to the hospital and treated for pneumonia with IV antibiotics followed by doxycycline and cefdinir.  She does not tolerate these antibiotics particularly doxycycline.  She saw my partner Dr. West Bali in clinic while she is off antibiotics and she gave  Augmentin to finish a course of antibiotics.  Vaidehi has undergone repeat SRS to brain metastases in March of 2023 and remains on herceptin alone.  Her coughing had improved dramatically since I last saw her she says she is smoking less than half a pack of cigarettes sometimes only 10/day now.  She is taking her voriconazole religiously.  Her husband  also had quit smoking altogether.  Tambi is being followed by Dr. Lindi Adie and is on herceptin.   She is also taking fenbendazole (antiheminthic for canines) and multiple other suppllements in hopes it will help her vs her cancer.   She does not appear to have any adverse side effects and her CBC and CMP are certainly without abnormalities despite being on all these other medications.        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   Asthma    Breast cancer ([place])    Bronchiectasis ([place])    Bronchiolitis    Cancer ([place])    breast cancer 2014   Cancer, metastatic to liver ([place])     3664   Complication of anesthesia    bp dropped + desat    COPD (chronic obstructive pulmonary disease) (H[facility])    Dyspnea    DOE   GERD (gastroesophageal reflux disease)    H/O coccidioidomycosis    was reason for lung lobectomy   Headache(784.0)    due to eye strain or not eating   History of anemia    no current problem   History of asthma    as a child   History of breast cancer 2014   left   History of chemotherapy    finished 07/17/2013   History of hiatal hernia    AGE 33   Hx of radiation therapy 03/25/13-05/06/13   left breast 5000 cGy/25 sessions, left breast boost 1000 cGy/5 sessions   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   Pneumonia    LAST  FLARE UP 01/2018   Rash 02/22/2021   Runny nose 07/30/2013   clear drainage   Smoker 05/24/2021   Wears dentures    upper    Past Surgical History:  Procedure Laterality Date   APPLICATION OF CRANIAL NAVIGATION N/A 08/04/2017   Procedure: APPLICATION OF CRANIAL NAVIGATION;  Surgeon: [name], [name], MD;  Location: [facility];  Service: Neurosurgery;  Laterality: N/A;   AXILLARY LYMPH NODE DISSECTION Left 02/08/2013   Procedure: LEFT AXILLARY DISSECTION;  Surgeon: Edward Jolly, MD;  Location: [facility];  Service: General;  Laterality: Left;   BREAST CYST EXCISION Right 2006   BREAST LUMPECTOMY Left 2014   BREAST LUMPECTOMY WITH NEEDLE LOCALIZATION AND AXILLARY SENTINEL LYMPH NODE BX Left 12/31/2012   Procedure: NEEDLE LOCALIZATION LEFT BREAST LUMPECTOMY AND LEFT AXILLARY SENTENIAL LYMPH NODE BX;  Surgeon: Edward Jolly, MD;  Location: [facility];  Service: General;  Laterality: Left;   BRONCHIAL BRUSHINGS  03/17/2020   Procedure: BRONCHIAL BRUSHINGS;  Surgeon: Garner Nash, DO;  Location: [facility];  Service: Pulmonary;;   BRONCHIAL WASHINGS  03/17/2020   Procedure: BRONCHIAL WASHINGS;  Surgeon: Garner Nash, DO;  Location: [facility];  Service: Pulmonary;;   CESAREAN SECTION  1995/1996    CRANIOTOMY Right 08/04/2017   Procedure: Right Frontal craniotomy for resection of tumor with stereotactic navigation;  Surgeon: [name], [name], MD;  Location: [facility];  Service: Neurosurgery;  Laterality: Right;  Right Frontal craniotomy for resection of tumor with stereotactic navigation   IR CV LINE INJECTION  02/19/2021   IR CV LINE INJECTION  11/12/2021   IR IMAGING GUIDED PORT INSERTION  05/31/2019   LUNG LOBECTOMY Left 05/1996   upper lobe - due to [facility] Fever   PORT-A-CATH REMOVAL Right 08/02/2013   Procedure: REMOVAL PORT-A-CATH;  Surgeon: Edward Jolly, MD;  Location: [facility];  Service: General;  Laterality: Right;   PORTACATH PLACEMENT  07/02/2012   Procedure: INSERTION PORT-A-CATH;  Surgeon: Edward Jolly, MD;  Location: [facility];  Service: General;  Laterality: N/A;  right   VIDEO BRONCHOSCOPY N/A 03/17/2020   Procedure: VIDEO BRONCHOSCOPY;  Surgeon: Garner Nash, DO;  Location: [facility];  Service: Pulmonary;  Laterality: N/A;   VIDEO BRONCHOSCOPY WITH ENDOBRONCHIAL ULTRASOUND N/A 05/23/2018   Procedure: VIDEO BRONCHOSCOPY WITH ENDOBRONCHIAL ULTRASOUND;  Surgeon: Garner Nash, DO;  Location: [facility];  Service: Thoracic;  Laterality: N/A;    Family History  Problem Relation Age of Onset   Emphysema Mother        was a smoker   Heart disease Mother    Melanoma Mother        dx in her 60s   Breast cancer Mother 53   Asthma Brother    Breast cancer Cousin        mother's maternal cousin; dx in her 49s      Social History   Socioeconomic History   Marital status: Married    Spouse name: Not on file   Number of children: 2   Years of education: Not on file   Highest education level: Not on file  Occupational History   Occupation: Secondary school teacher  Tobacco Use   Smoking status: Every Day    Packs/day: 0.30    Years: 38.00    Total pack years: 11.40    Types: Cigarettes   Smokeless tobacco: Never   Tobacco  comments:    10 cigarettes smoked daily  10/19/20 ARJ   Vaping Use   Vaping Use: Never used  Substance and Sexual Activity   Alcohol use: No   Drug use: No   Sexual activity: Not Currently  Other Topics Concern   Not on file  Social History Narrative   Not on file   Social Determinants of Health   Financial Resource Strain: Not on file  Food Insecurity: Not on file  Transportation Needs: Not on file  Physical Activity: Not on file  Stress: Not on file  Social Connections: Not on file    Allergies  Allergen Reactions   Aspirin Anaphylaxis and Shortness Of Breath    THROAT CLOSES   Protonix [Pantoprazole] Nausea Only and Other (See Comments)    Also caused a "film in the mouth" and caused chest pressure   Doxycycline Other (See Comments)    Hallucinations, headaches   Promethazine-Codeine Cough    Worsening cough and insomnia    Iodinated Contrast Media Rash    Patient allergic to contrast used in radiation oncology for CT simulation      Current Outpatient Medications:    acetaminophen (TYLENOL) 325 MG tablet, Take 2 tablets (650 mg total) by mouth every 6 (six) hours as needed for mild pain (or Fever >/= 101)., Disp: 12 tablet, Rfl: 0   albuterol (VENTOLIN HFA) 108 (90 Base) MCG/ACT inhaler, Inhale 2 puffs into the lungs every 6 (six) hours as needed for wheezing or shortness of breath (cough)., Disp: 18 g, Rfl: 5   cholecalciferol (VITAMIN D3) 25 MCG (1000 UNIT) tablet, Take 1,000 Units by mouth daily., Disp: , Rfl:    diphenhydrAMINE (BENADRYL) 50 MG tablet, Take 1 tablet (50 mg total) by mouth once for 1 dose. Take Benadryl 50 mg at 7 am on 4/25, Disp: 1 tablet, Rfl: 0   Fluticasone-Salmeterol,sensor, (AIRDUO DIGIHALER) 113-14 MCG/ACT AEPB, Inhale 1 puff into the lungs 2 (two) times daily., Disp: 1 each, Rfl: 4   Hydrocod Polst-Chlorphen Polst (CHLORPHENIRAMINE-HYDROCODONE) 10-8 MG/5ML, Take 5 mLs by mouth 2 times daily., Disp: 115 mL, Rfl: 0   lidocaine-prilocaine  (EMLA) cream, APPLY EXTERNALLY TO THE AFFECTED AREA 1 TIME, Disp: 30 g, Rfl: 3   Multiple Vitamins-Minerals (EMERGEN-C VITAMIN C PO), Take by mouth., Disp: , Rfl:    Vitamin E 200 units TABS, Take by mouth., Disp: , Rfl:    voriconazole (VFEND) 200 MG tablet, Take 1 tablet (200 mg total) by mouth 2 (two) times daily., Disp: 60 tablet, Rfl: 11   predniSONE (DELTASONE) 50 MG tablet, Take prednisone 50 mg 13 hours, 7 hours, and 1 hour prior to CT scan. (Patient not taking: Reported on 02/23/2022), Disp: 3 tablet, Rfl: 0 No current facility-administered medications for this visit.  Facility-Administered Medications Ordered in Other Visits:    alteplase (CATHFLO ACTIVASE) injection 2 mg, 2 mg, Intracatheter, Once PRN, Nicholas Lose, MD   sodium chloride flush (NS) 0.9 % injection 10 mL, 10 mL, Intracatheter, PRN, Nicholas Lose, MD, 10 mL at 07/06/21 1428   Review of Systems  Constitutional:  Negative for activity change, appetite change, chills, diaphoresis, fatigue, fever and unexpected weight change.  HENT:  Negative for congestion, rhinorrhea, sinus pressure, sneezing, sore throat and trouble swallowing.   Eyes:  Negative for photophobia and visual disturbance.  Respiratory:  Positive for cough and shortness of breath. Negative for chest tightness, wheezing and stridor.   Cardiovascular:  Negative for chest pain, palpitations and leg swelling.  Gastrointestinal:  Negative for abdominal distention, abdominal pain, anal bleeding,  blood in stool, constipation, diarrhea, nausea and vomiting.  Genitourinary:  Negative for difficulty urinating, dysuria, flank pain and hematuria.  Musculoskeletal:  Negative for arthralgias, back pain, gait problem, joint swelling and myalgias.  Skin:  Negative for color change, pallor, rash and wound.  Neurological:  Negative for dizziness, tremors, weakness and light-headedness.  Hematological:  Negative for adenopathy. Does not bruise/bleed easily.   Psychiatric/Behavioral:  Negative for agitation, behavioral problems, confusion, decreased concentration, dysphoric mood and sleep disturbance.        Objective:   Physical Exam Constitutional:      General: She is not in acute distress.    Appearance: Normal appearance. She is well-developed. She is not ill-appearing or diaphoretic.  HENT:     Head: Normocephalic and atraumatic.     Right Ear: Hearing and external ear normal.     Left Ear: Hearing and external ear normal.     Nose: No nasal deformity or rhinorrhea.  Eyes:     General: No scleral icterus.    Conjunctiva/sclera: Conjunctivae normal.     Right eye: Right conjunctiva is not injected.     Left eye: Left conjunctiva is not injected.     Pupils: Pupils are equal, round, and reactive to light.  Neck:     Vascular: No JVD.  Cardiovascular:     Rate and Rhythm: Normal rate and regular rhythm.     Heart sounds: Normal heart sounds, S1 normal and S2 normal. No murmur heard.    No friction rub.  Pulmonary:     Effort: Prolonged expiration present.     Breath sounds: Wheezing present.  Abdominal:     General: Bowel sounds are normal. There is no distension.     Palpations: Abdomen is soft.     Tenderness: There is no abdominal tenderness.  Musculoskeletal:        General: Normal range of motion.     Right shoulder: Normal.     Left shoulder: Normal.     Cervical back: Normal range of motion and neck supple.     Right hip: Normal.     Left hip: Normal.     Right knee: Normal.     Left knee: Normal.  Lymphadenopathy:     Head:     Right side of head: No submandibular, preauricular or posterior auricular adenopathy.     Left side of head: No submandibular, preauricular or posterior auricular adenopathy.     Cervical: No cervical adenopathy.     Right cervical: No superficial or deep cervical adenopathy.    Left cervical: No superficial or deep cervical adenopathy.  Skin:    General: Skin is warm and dry.      Coloration: Skin is not pale.     Findings: No abrasion, bruising, ecchymosis, erythema, lesion or rash.     Nails: There is no clubbing.  Neurological:     Mental Status: She is alert and oriented to person, place, and time.     Sensory: No sensory deficit.     Coordination: Coordination normal.     Gait: Gait normal.  Psychiatric:        Attention and Perception: She is attentive.        Mood and Affect: Mood normal.        Speech: Speech normal.        Behavior: Behavior normal. Behavior is cooperative.        Thought Content: Thought content normal.  Judgment: Judgment normal.          Assessment & Plan:   Mycetoma: Continue voriconazole:  Breast cancer: Following closely with Dr. Lindi Adie. 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 personally reviewing her CT scan lungs, along with review of medical records in preparation for the visit and during the visit and in coordination of her care.

## 2022-02-24 ENCOUNTER — Other Ambulatory Visit: Payer: Self-pay

## 2022-02-24 NOTE — Progress Notes (Signed)
Patient Care Team: Patient, [name] as PCP - General (General Practice) [name], MD as Referring Physician (Emergency Medicine)  DIAGNOSIS:  Encounter Diagnosis  Name Primary?   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 Ki-67 was 70% HER-2 was amplified with a ratio 2.91   07/12/2012 - 07/17/2013 Neo-Adjuvant Chemotherapy   TCH 6 followed by Herceptin maintenance   12/11/2012 Surgery   Left breast lumpectomy: 1.8 cm tumor 1 positive sentinel node, axillary lymph node dissection 02/08/2013 showed 0/13 lymph nodes   03/25/2013 - 05/06/2013 Radiation Therapy   Adjuvant radiation therapy   06/05/2013 - 07/20/2017 Anti-estrogen oral therapy   Tamoxifen 20 mg daily   07/27/2017 Relapse/Recurrence   MRI Brain: 3.4 x 2.9 x 2.9 cm RIGHT frontal lobe mass with imaging characteristics of solitary metastasis. Extensive vasogenic edema resulting in 9 mm RIGHT to LEFT midline shift. Equivocal very early LEFT ventricle entrapment.    08/04/2017 Surgery   Rt frontal brain resection: Poorly differentiated tumor IHC suggests breast primary ER and PR Positive   08/25/2017 - 09/04/2017 Radiation Therapy   Stereotactic radiation   09/18/2017 -  Anti-estrogen oral therapy   Lapatinib with letrozole   12/18/2017 - 12/19/2017 Radiation Therapy   New right parietal lobe metastases status post [facility]   08/27/2018 - 08/27/2020 Radiation Therapy   SRS to new brain metastases   05/09/2019 Relapse/Recurrence   Interval increase in size of the enhancing nodular left internal mammary soft tissue 2.8 cm.  Redemonstrated  enlarged supraclavicular, lower cervical and lower posterior cervical nodes unchanged.  Interval increase in the bony erosion of the posterior and lateral left third rib, increasing soft tissue lesion eroding the left sternal body 3.1 cm was 2.5 cm.  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 with the above-mentioned history of metastatic breast cancer currently on treatment with Xeloda and Herceptin. She presents to the clinic today for follow-up. She states that she has been doing great.  She saw Dr. Drucilla Schmidt with infectious disease who is monitoring her closely.  She believes that the infection of the lung is not getting any worse. Weight has improved and also appetite has gotten better.   ALLERGIES:  is allergic to aspirin, protonix [pantoprazole], doxycycline, promethazine-codeine, and iodinated contrast media.  MEDICATIONS:  Current Outpatient Medications  Medication Sig Dispense Refill   diphenhydrAMINE (BENADRYL) 50 MG tablet Take 1 tablet (50 mg total) by mouth once for 1 dose. Take Benadryl 50 mg at 7 am on 4/25 1 tablet 0   acetaminophen (TYLENOL) 325 MG tablet Take 2 tablets (650 mg total) by mouth every 6 (six) hours as  needed for mild pain (or Fever >/= 101). 12 tablet 0   albuterol (VENTOLIN HFA) 108 (90 Base) MCG/ACT inhaler Inhale 2 puffs into the lungs every 6 (six) hours as needed for wheezing or shortness of breath (cough). 18 g 5   cholecalciferol (VITAMIN D3) 25 MCG (1000 UNIT)  tablet Take 1,000 Units by mouth daily.     Fluticasone-Salmeterol,sensor, (AIRDUO DIGIHALER) 113-14 MCG/ACT AEPB Inhale 1 puff into the lungs 2 (two) times daily. 1 each 4   Hydrocod Polst-Chlorphen Polst (CHLORPHENIRAMINE-HYDROCODONE) 10-8 MG/5ML Take 5 mLs by mouth 2 times daily. 115 mL 0   lidocaine-prilocaine (EMLA) cream APPLY EXTERNALLY TO THE AFFECTED AREA 1 TIME 30 g 3   Multiple Vitamins-Minerals (EMERGEN-C VITAMIN C PO) Take by mouth.     predniSONE (DELTASONE) 50 MG tablet Take prednisone 50 mg 13 hours, 7 hours, and 1 hour prior to CT scan. (Patient not taking: Reported on 02/23/2022) 3 tablet 0   Vitamin E 200 units TABS Take by mouth.     voriconazole (VFEND) 200 MG tablet Take 1 tablet (200 mg total) by mouth 2 (two) times daily. 60 tablet 11   No current facility-administered medications for this visit.   Facility-Administered Medications Ordered in Other Visits  Medication Dose Route Frequency Provider Last Rate Last Admin   alteplase (CATHFLO ACTIVASE) injection 2 mg  2 mg Intracatheter Once PRN Nicholas Lose, MD       sodium chloride flush (NS) 0.9 % injection 10 mL  10 mL Intracatheter PRN Nicholas Lose, MD   10 mL at 07/06/21 1428    PHYSICAL EXAMINATION: ECOG PERFORMANCE STATUS: 1 - Symptomatic but completely ambulatory  Vitals:   02/28/22 0937  BP: 121/68  Pulse: 85  Resp: 18  Temp: 97.8 F (36.6 C)  SpO2: 98%   Filed Weights   02/28/22 0937  Weight: 102 lb 1.6 oz (46.3 kg)   Lungs: Left-sided wheezes and noises.  LABORATORY DATA:  I have reviewed the data as listed    Latest Ref Rng & Units 01/18/2022   10:36 AM 12/07/2021   12:31 PM 10/26/2021    9:11 AM  CMP  Glucose 70 - 99 mg/dL 100  96  70   BUN 6 - 20 mg/dL $Remove'13  16  15   'urPCZFL$ Creatinine 0.44 - 1.00 mg/dL 0.75  0.79  0.90   Sodium 135 - 145 mmol/L 139  136  138   Potassium 3.5 - 5.1 mmol/L 4.2  4.2  4.5   Chloride 98 - 111 mmol/L 106  102  105   CO2 22 - 32 mmol/L $RemoveB'28  30  28   'iUCBwPzd$ Calcium 8.9 - 10.3  mg/dL 9.9  9.6  9.8   Total Protein 6.5 - 8.1 g/dL 7.7  7.3  8.0   Total Bilirubin 0.3 - 1.2 mg/dL 0.2  0.2  0.2   Alkaline Phos 38 - 126 U/L 74  67  74   AST 15 - 41 U/L $Remo'18  17  20   'KiENr$ ALT 0 - 44 U/L $Remo'15  12  17     'LqWMD$ Lab Results  Component Value Date   WBC 8.9 02/28/2022   HGB 12.1 02/28/2022   HCT 35.6 (L) 02/28/2022   MCV 90.6 02/28/2022   PLT 315 02/28/2022   NEUTROABS 5.4 02/28/2022    ASSESSMENT & PLAN:  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  [place] brain: 08/25/2017-09/04/2017 2. Anti Her 2 therapy with Lapatinib started 09/17/2017-05/14/2019: Stopped for progression 3.  I discontinued tamoxifen and started her on letrozole 2.5 mg daily.  05/14/2019 stopped for progression 4.  Stereotactic radiosurgery 12/19/2017 to the new right parietal lobe metastases. 5.  Kadcyla: Received 16 cycles discontinued 05/05/2020 6.  Tucatinib Xeloda: Discontinued because of hand-foot syndrome 06/11/2020-12/15/2020 7. Herceptin/Neratinib: unable to tolerate due to diarrhea and weight loss 8.  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  The spot on the liver noted on the CT scan did not have any activity on the PET scan.  Postinflammatory changes in the lungs chronic sequelae of atypical infection   Brain MRI 10/01/2021: Progression of disease.  2 new lesions, total of 9 enhancing brain metastases, SRS completed by Dr. Tammi Klippel   Based on this we decided to continue with Herceptin maintenance alone.  The only other option would be Margetuximab.  Plan: Perform CT scans in December for monitoring metastatic breast cancer    Orders Placed This Encounter  Procedures   CT CHEST ABDOMEN PELVIS W CONTRAST    Standing Status:   Future    Standing Expiration Date:   03/01/2023    Order Specific Question:   Is patient pregnant?    Answer:   No    Order Specific Question:   Preferred imaging location?    Answer:   [facility]    Order Specific Question:   Is Oral Contrast requested for this exam?    Answer:   Yes, Per Radiology protocol   The patient has a good understanding of the overall plan. she agrees with it. she will call with any problems that may develop before the next visit here. Total time spent: 30 mins including face to face time and time spent for planning, charting and co-ordination of care   Harriette Ohara, MD 02/28/22    I Gardiner Coins am scribing for Dr. Lindi Adie  I have reviewed the above documentation for accuracy and completeness, and I agree with the above.

## 2022-02-28 ENCOUNTER — Inpatient Hospital Stay: Payer: 59

## 2022-02-28 ENCOUNTER — Other Ambulatory Visit: Payer: Self-pay

## 2022-02-28 ENCOUNTER — Encounter: Payer: Self-pay | Admitting: Radiation Oncology

## 2022-02-28 ENCOUNTER — Inpatient Hospital Stay: Payer: 59 | Admitting: Hematology and Oncology

## 2022-02-28 ENCOUNTER — Encounter: Payer: Self-pay | Admitting: Hematology and Oncology

## 2022-02-28 VITALS — BP 121/68 | HR 85 | Temp 97.8°F | Resp 18 | Ht 61.0 in | Wt 102.1 lb

## 2022-02-28 VITALS — BP 110/57 | HR 79 | Temp 98.6°F | Resp 18

## 2022-02-28 DIAGNOSIS — C50212 Malignant neoplasm of upper-inner quadrant of left female breast: Secondary | ICD-10-CM

## 2022-02-28 DIAGNOSIS — Z5112 Encounter for antineoplastic immunotherapy: Secondary | ICD-10-CM | POA: Diagnosis not present

## 2022-02-28 DIAGNOSIS — Z95828 Presence of other vascular implants and grafts: Secondary | ICD-10-CM

## 2022-02-28 DIAGNOSIS — Z171 Estrogen receptor negative status [ER-]: Secondary | ICD-10-CM | POA: Diagnosis not present

## 2022-02-28 LAB — CBC WITH DIFFERENTIAL (CANCER CENTER ONLY)
Abs Immature Granulocytes: 0.02 10*3/uL (ref 0.00–0.07)
Basophils Absolute: 0.1 10*3/uL (ref 0.0–0.1)
Basophils Relative: 1 %
Eosinophils Absolute: 0.8 10*3/uL — ABNORMAL HIGH (ref 0.0–0.5)
Eosinophils Relative: 9 %
HCT: 35.6 % — ABNORMAL LOW (ref 36.0–46.0)
Hemoglobin: 12.1 g/dL (ref 12.0–15.0)
Immature Granulocytes: 0 %
Lymphocytes Relative: 21 %
Lymphs Abs: 1.9 10*3/uL (ref 0.7–4.0)
MCH: 30.8 pg (ref 26.0–34.0)
MCHC: 34 g/dL (ref 30.0–36.0)
MCV: 90.6 fL (ref 80.0–100.0)
Monocytes Absolute: 0.7 10*3/uL (ref 0.1–1.0)
Monocytes Relative: 7 %
Neutro Abs: 5.4 10*3/uL (ref 1.7–7.7)
Neutrophils Relative %: 62 %
Platelet Count: 315 10*3/uL (ref 150–400)
RBC: 3.93 MIL/uL (ref 3.87–5.11)
RDW: 16.8 % — ABNORMAL HIGH (ref 11.5–15.5)
WBC Count: 8.9 10*3/uL (ref 4.0–10.5)
nRBC: 0 % (ref 0.0–0.2)

## 2022-02-28 LAB — CMP (CANCER CENTER ONLY)
ALT: 13 U/L (ref 0–44)
AST: 19 U/L (ref 15–41)
Albumin: 4 g/dL (ref 3.5–5.0)
Alkaline Phosphatase: 72 U/L (ref 38–126)
Anion gap: 4 — ABNORMAL LOW (ref 5–15)
BUN: 12 mg/dL (ref 6–20)
CO2: 29 mmol/L (ref 22–32)
Calcium: 10 mg/dL (ref 8.9–10.3)
Chloride: 106 mmol/L (ref 98–111)
Creatinine: 0.73 mg/dL (ref 0.44–1.00)
GFR, Estimated: >60 mL/min (ref >60)
Glucose, Bld: 100 mg/dL — ABNORMAL HIGH (ref 70–99)
Potassium: 4.1 mmol/L (ref 3.5–5.1)
Sodium: 139 mmol/L (ref 135–145)
Total Bilirubin: 0.2 mg/dL — ABNORMAL LOW (ref 0.3–1.2)
Total Protein: 7.4 g/dL (ref 6.5–8.1)

## 2022-02-28 LAB — MAGNESIUM: Magnesium: 1.8 mg/dL (ref 1.7–2.4)

## 2022-02-28 MED ORDER — SODIUM CHLORIDE 0.9 % IV SOLN: Freq: Once | INTRAVENOUS | Status: AC

## 2022-02-28 MED ORDER — HEPARIN SOD (PORK) LOCK FLUSH 100 UNIT/ML IV SOLN
500.0000 [IU] | Freq: Once | INTRAVENOUS | Status: AC | PRN
  Administered 2022-02-28: 500 [IU]

## 2022-02-28 MED ORDER — SODIUM CHLORIDE 0.9% FLUSH
10.0000 mL | INTRAVENOUS | Status: DC | PRN
  Administered 2022-02-28: 10 mL

## 2022-02-28 MED ORDER — ACETAMINOPHEN 325 MG PO TABS
650.0000 mg | ORAL_TABLET | Freq: Once | ORAL | Status: AC
  Administered 2022-02-28: 650 mg via ORAL
  Filled 2022-02-28: qty 2

## 2022-02-28 MED ORDER — DIPHENHYDRAMINE HCL 25 MG PO CAPS
25.0000 mg | ORAL_CAPSULE | Freq: Once | ORAL | Status: AC
  Administered 2022-02-28: 25 mg via ORAL
  Filled 2022-02-28: qty 1

## 2022-02-28 MED ORDER — TRASTUZUMAB-ANNS CHEMO 150 MG IV SOLR
6.0000 mg/kg | Freq: Once | INTRAVENOUS | Status: AC
  Administered 2022-02-28: 252 mg via INTRAVENOUS
  Filled 2022-02-28: qty 12

## 2022-02-28 NOTE — Patient Instructions (Signed)
[facility]  Discharge Instructions: Thank you for choosing [facility] to provide your oncology and hematology care.   If you have a lab appointment with the [facility], please go directly to the [facility] and check in at the registration area.   Wear comfortable clothing and clothing appropriate for easy access to any Portacath or PICC line.   We strive to give you quality time with your provider. You may need to reschedule your appointment if you arrive late (15 or more minutes).  Arriving late affects you and other patients whose appointments are after yours.  Also, if you miss three or more appointments without notifying the office, you may be dismissed from the clinic at the provider's discretion.      For prescription refill requests, have your pharmacy contact our office and allow 72 hours for refills to be completed.    Today you received the following chemotherapy and/or immunotherapy agent: Trastuzumab ([place]inti)   To help prevent nausea and vomiting after your treatment, we encourage you to take your nausea medication as directed.  BELOW ARE SYMPTOMS THAT SHOULD BE REPORTED IMMEDIATELY: *FEVER GREATER THAN 100.4 F (38 C) OR HIGHER *CHILLS OR SWEATING *NAUSEA AND VOMITING THAT IS NOT CONTROLLED WITH YOUR NAUSEA MEDICATION *UNUSUAL SHORTNESS OF BREATH *UNUSUAL BRUISING OR BLEEDING *URINARY PROBLEMS (pain or burning when urinating, or frequent urination) *BOWEL PROBLEMS (unusual diarrhea, constipation, pain near the anus) TENDERNESS IN MOUTH AND THROAT WITH OR WITHOUT PRESENCE OF ULCERS (sore throat, sores in mouth, or a toothache) UNUSUAL RASH, SWELLING OR PAIN  UNUSUAL VAGINAL DISCHARGE OR ITCHING   Items with * indicate a potential emergency and should be followed up as soon as possible or go to the Emergency Department if any problems should occur.  Please show the CHEMOTHERAPY ALERT CARD or IMMUNOTHERAPY ALERT CARD at  check-in to the Emergency Department and triage nurse.  Should you have questions after your visit or need to cancel or reschedule your appointment, please contact [facility]  Dept: [phone]  and follow the prompts.  Office hours are 8:00 a.m. to 4:30 p.m. Monday - Friday. Please note that voicemails left after 4:00 p.m. may not be returned until the following business day.  We are closed weekends and major holidays. You have access to a nurse at all times for urgent questions. Please call the main number to the clinic Dept: [phone] and follow the prompts.   For any non-urgent questions, you may also contact your provider using MyChart. We now offer e-Visits for anyone 63 and older to request care online for non-urgent symptoms. For details visit [email or web address].   Also download the MyChart app! Go to the app store, search "MyChart", open the app, select [facility], and log in with your MyChart username and password.  Masks are optional in the cancer centers. If you would like for your care team to wear a mask while they are taking care of you, please let them know. You may have one support person who is at least 54 years old accompany you for your appointments. Trastuzumab Injection What is this medication? TRASTUZUMAB (tras TOO zoo mab) treats breast cancer and stomach cancer. It works by blocking a protein that causes cancer cells to grow and multiply. This helps to slow or stop the spread of cancer cells. This medicine may be used for other purposes; ask your health care provider or pharmacist if you have questions. COMMON BRAND NAME(S):  Herceptin, Donnald Garre What should I tell my care team before I take this medication? They need to know if you have any of these conditions: Heart failure Lung disease An unusual or allergic reaction to trastuzumab, other medications, foods, dyes, or preservatives Pregnant or  trying to get pregnant Breast-feeding How should I use this medication? This medication is injected into a vein. It is given by your care team in a hospital or clinic setting. Talk to your care team about the use of this medication in children. It is not approved for use in children. Overdosage: If you think you have taken too much of this medicine contact a poison control center or emergency room at once. NOTE: This medicine is only for you. Do not share this medicine with others. What if I miss a dose? Keep appointments for follow-up doses. It is important not to miss your dose. Call your care team if you are unable to keep an appointment. What may interact with this medication? Certain types of chemotherapy, such as daunorubicin, doxorubicin, epirubicin, idarubicin This list may not describe all possible interactions. Give your health care provider a list of all the medicines, herbs, non-prescription drugs, or dietary supplements you use. Also tell them if you smoke, drink alcohol, or use illegal drugs. Some items may interact with your medicine. What should I watch for while using this medication? Your condition will be monitored carefully while you are receiving this medication. This medication may make you feel generally unwell. This is not uncommon, as chemotherapy affects healthy cells as well as cancer cells. Report any side effects. Continue your course of treatment even though you feel ill unless your care team tells you to stop. This medication may increase your risk of getting an infection. Call your care team for advice if you get a fever, chills, sore throat, or other symptoms of a cold or flu. Do not treat yourself. Try to avoid being around people who are sick. Avoid taking medications that contain aspirin, acetaminophen, ibuprofen, naproxen, or ketoprofen unless instructed by your care team. These medications can hide a fever. Talk to your care team if you may be pregnant. Serious  birth defects can occur if you take this medication during pregnancy and for 7 months after the last dose. You will need a negative pregnancy test before starting this medication. Contraception is recommended while taking this medication and for 7 months after the last dose. Your care team can help you find the option that works for you. Do not breastfeed while taking this medication and for 7 months after stopping treatment. What side effects may I notice from receiving this medication? Side effects that you should report to your care team as soon as possible: Allergic reactions or angioedema--skin rash, itching or hives, swelling of the face, eyes, lips, tongue, arms, or legs, trouble swallowing or breathing Dry cough, shortness of breath or trouble breathing Heart failure--shortness of breath, swelling of the ankles, feet, or hands, sudden weight gain, unusual weakness or fatigue Infection--fever, chills, cough, or sore throat Infusion reactions--chest pain, shortness of breath or trouble breathing, feeling faint or lightheaded Side effects that usually do not require medical attention (report to your care team if they continue or are bothersome): Diarrhea Dizziness Headache Nausea Trouble sleeping Vomiting This list may not describe all possible side effects. Call your doctor for medical advice about side effects. You may report side effects to FDA at 1-800-FDA-1088. Where should I keep my medication? This medication  is given in a hospital or clinic. It will not be stored at home. NOTE: This sheet is a summary. It may not cover all possible information. If you have questions about this medicine, talk to your doctor, pharmacist, or health care provider.  2023 Elsevier/Gold Standard (2021-11-09 00:00:00)

## 2022-02-28 NOTE — Progress Notes (Signed)
  Radiation Oncology         [phone] ________________________________  Name: Melissa [name] MRN: [id number]  Date: 10/12/2021  DOB: 1966/03/29  End of Treatment Note  Diagnosis:    56 yo woman with a new 3 mm metastatic lesion in the right pons from ER+ Her2+ cancer of the upper inner left breast.     Indication for treatment:  Palliative       Radiation treatment dates:   10/12/21  Site/dose/beams/energy:   Right [place] 3 mm target was treated using 5 Dynamic Conformal Arcs to a prescription dose of 16 Gy.  ExacTrac registration was performed for each couch angle.  The 100% isodose line was prescribed.  6 MV X-rays were delivered in the flattening filter free beam mode.  Narrative: The patient tolerated radiation treatment relatively well.     Plan: The patient has completed radiation treatment. The patient will return to radiation oncology clinic for routine followup in one month. I advised her to call or return sooner if she has any questions or concerns related to her recovery or treatment. ________________________________  Sheral Apley. Tammi Klippel, M.D.

## 2022-02-28 NOTE — Assessment & Plan Note (Signed)
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 Anti Her 2 therapy with Lapatinibstarted 09/17/2017-05/14/2019: Stopped for progression 3.I discontinuedtamoxifen and started her on letrozole 2.5 mg daily.05/14/2019 stopped for progression 4.Stereotactic radiosurgery 12/19/2017 to the new right parietal lobe metastases. 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 The spot on the liver noted on the CT scan did not have any activity on the PET scan. Postinflammatory  changes in the lungs chronic sequelae of atypical infection  Brain MRI 10/01/2021: Progression of disease.2new lesions, total of 9 enhancing brain metastases, SRS completed by Dr. Manning  Based on this we decided to continue with Herceptin maintenance alone. The only other option would be Margetuximab.  Plan: Perform CT scans in December for monitoring metastatic breast cancer 

## 2022-03-01 ENCOUNTER — Inpatient Hospital Stay: Payer: 59

## 2022-03-01 ENCOUNTER — Inpatient Hospital Stay: Payer: 59 | Admitting: Hematology and Oncology

## 2022-03-02 ENCOUNTER — Other Ambulatory Visit: Payer: Self-pay

## 2022-03-03 ENCOUNTER — Ambulatory Visit
Admission: RE | Admit: 2022-03-03 | Discharge: 2022-03-03 | Disposition: A | Discharge status: Home / Self Care | Payer: 59 | Source: Ambulatory Visit | Attending: Hematology and Oncology | Admitting: Hematology and Oncology

## 2022-03-03 DIAGNOSIS — Z1231 Encounter for screening mammogram for malignant neoplasm of breast: Secondary | ICD-10-CM

## 2022-03-04 ENCOUNTER — Other Ambulatory Visit: Payer: Self-pay | Admitting: Radiation Therapy

## 2022-03-04 DIAGNOSIS — C7931 Secondary malignant neoplasm of brain: Secondary | ICD-10-CM

## 2022-03-04 NOTE — Progress Notes (Signed)
Port access order entered for October Brain MRI at [facility].   Mont Dutton R.T.(R)(T) Radiation Special Procedures Navigator

## 2022-03-07 ENCOUNTER — Other Ambulatory Visit: Payer: Self-pay | Admitting: Hematology and Oncology

## 2022-03-07 DIAGNOSIS — R928 Other abnormal and inconclusive findings on diagnostic imaging of breast: Secondary | ICD-10-CM

## 2022-03-21 ENCOUNTER — Other Ambulatory Visit: Payer: Self-pay | Admitting: Hematology and Oncology

## 2022-03-22 ENCOUNTER — Ambulatory Visit: Payer: 59

## 2022-03-22 ENCOUNTER — Inpatient Hospital Stay: Payer: 59 | Admitting: Nutrition

## 2022-03-22 ENCOUNTER — Other Ambulatory Visit: Payer: Self-pay

## 2022-03-22 ENCOUNTER — Inpatient Hospital Stay: Payer: 59 | Attending: Medical

## 2022-03-22 ENCOUNTER — Ambulatory Visit
Admission: RE | Admit: 2022-03-22 | Discharge: 2022-03-22 | Disposition: A | Discharge status: Home / Self Care | Payer: 59 | Source: Ambulatory Visit | Attending: Hematology and Oncology | Admitting: Hematology and Oncology

## 2022-03-22 VITALS — BP 108/68 | HR 70 | Resp 17 | Wt 103.2 lb

## 2022-03-22 DIAGNOSIS — C787 Secondary malignant neoplasm of liver and intrahepatic bile duct: Secondary | ICD-10-CM | POA: Insufficient documentation

## 2022-03-22 DIAGNOSIS — Z5112 Encounter for antineoplastic immunotherapy: Secondary | ICD-10-CM | POA: Diagnosis present

## 2022-03-22 DIAGNOSIS — C7931 Secondary malignant neoplasm of brain: Secondary | ICD-10-CM | POA: Insufficient documentation

## 2022-03-22 DIAGNOSIS — R928 Other abnormal and inconclusive findings on diagnostic imaging of breast: Secondary | ICD-10-CM

## 2022-03-22 DIAGNOSIS — C50212 Malignant neoplasm of upper-inner quadrant of left female breast: Secondary | ICD-10-CM | POA: Insufficient documentation

## 2022-03-22 MED ORDER — DIPHENHYDRAMINE HCL 25 MG PO CAPS
25.0000 mg | ORAL_CAPSULE | Freq: Once | ORAL | Status: AC
  Administered 2022-03-22: 25 mg via ORAL
  Filled 2022-03-22: qty 1

## 2022-03-22 MED ORDER — ACETAMINOPHEN 325 MG PO TABS
650.0000 mg | ORAL_TABLET | Freq: Once | ORAL | Status: AC
  Administered 2022-03-22: 650 mg via ORAL
  Filled 2022-03-22: qty 2

## 2022-03-22 MED ORDER — SODIUM CHLORIDE 0.9% FLUSH
10.0000 mL | INTRAVENOUS | Status: DC | PRN
  Administered 2022-03-22: 10 mL

## 2022-03-22 MED ORDER — TRASTUZUMAB-ANNS CHEMO 150 MG IV SOLR
6.0000 mg/kg | Freq: Once | INTRAVENOUS | Status: AC
  Administered 2022-03-22: 252 mg via INTRAVENOUS
  Filled 2022-03-22: qty 12

## 2022-03-22 MED ORDER — SODIUM CHLORIDE 0.9 % IV SOLN: Freq: Once | INTRAVENOUS | Status: AC

## 2022-03-22 MED ORDER — HEPARIN SOD (PORK) LOCK FLUSH 100 UNIT/ML IV SOLN
500.0000 [IU] | Freq: Once | INTRAVENOUS | Status: AC | PRN
  Administered 2022-03-22: 500 [IU]

## 2022-03-22 NOTE — Patient Instructions (Signed)
[facility] MEDICAL [facility]COLOGY  Discharge Instructions: Thank you for choosing [facility] to provide your oncology and hematology care.   If you have a lab appointment with the Cancer Center, please go directly to the Cancer Center and check in at the registration area.   Wear comfortable clothing and clothing appropriate for easy access to any Portacath or PICC line.   We strive to give you quality time with your provider. You may need to reschedule your appointment if you arrive late (15 or more minutes).  Arriving late affects you and other patients whose appointments are after yours.  Also, if you miss three or more appointments without notifying the office, you may be dismissed from the clinic at the provider's discretion.      For prescription refill requests, have your pharmacy contact our office and allow 72 hours for refills to be completed.    Today you received the following chemotherapy and/or immunotherapy agents: trastuzumab-anns      To help prevent nausea and vomiting after your treatment, we encourage you to take your nausea medication as directed.  BELOW ARE SYMPTOMS THAT SHOULD BE REPORTED IMMEDIATELY: *FEVER GREATER THAN 100.4 F (38 C) OR HIGHER *CHILLS OR SWEATING *NAUSEA AND VOMITING THAT IS NOT CONTROLLED WITH YOUR NAUSEA MEDICATION *UNUSUAL SHORTNESS OF BREATH *UNUSUAL BRUISING OR BLEEDING *URINARY PROBLEMS (pain or burning when urinating, or frequent urination) *BOWEL PROBLEMS (unusual diarrhea, constipation, pain near the anus) TENDERNESS IN MOUTH AND THROAT WITH OR WITHOUT PRESENCE OF ULCERS (sore throat, sores in mouth, or a toothache) UNUSUAL RASH, SWELLING OR PAIN  UNUSUAL VAGINAL DISCHARGE OR ITCHING   Items with * indicate a potential emergency and should be followed up as soon as possible or go to the Emergency Department if any problems should occur.  Please show the CHEMOTHERAPY ALERT CARD or IMMUNOTHERAPY ALERT CARD at  check-in to the Emergency Department and triage nurse.  Should you have questions after your visit or need to cancel or reschedule your appointment, please contact [facility]  Dept: [phone]  and follow the prompts.  Office hours are 8:00 a.m. to 4:30 p.m. Monday - Friday. Please note that voicemails left after 4:00 p.m. may not be returned until the following business day.  We are closed weekends and major holidays. You have access to a nurse at all times for urgent questions. Please call the main number to the clinic Dept: [phone] and follow the prompts.   For any non-urgent questions, you may also contact your provider using MyChart. We now offer e-Visits for anyone 18 and older to request care online for non-urgent symptoms. For details visit [email or web address].   Also download the MyChart app! Go to the app store, search "MyChart", open the app, select [facility], and log in with your MyChart username and password.  Masks are optional in the cancer centers. If you would like for your care team to wear a mask while they are taking care of you, please let them know. You may have one support person who is at least 54 years old accompany you for your appointments. 

## 2022-03-22 NOTE — Progress Notes (Signed)
Brief nutrition follow-up completed with patient during IV fluids for metastatic breast cancer. Weight improved to 103.25 pounds September 12 from 100 pounds 4 ounces August 1. Patient reports she feels great.  She was on a vacation last week and ate very well.  She denies nutrition impact symptoms.  She has no questions or concerns.  She continues to drink [employer] 1.4 twice daily.  Nutrition diagnosis: Inadequate oral intake resolved.  Provided support and encouragement.  Patient to continue strategies for adequate calorie and protein intake.  She agrees to contact RD with questions or concerns.  **Disclaimer: This note was dictated with voice recognition software. Similar sounding words can inadvertently be transcribed and this note may contain transcription errors which may not have been corrected upon publication of note.**

## 2022-03-28 ENCOUNTER — Encounter: Payer: Self-pay | Admitting: Hematology and Oncology

## 2022-03-28 ENCOUNTER — Other Ambulatory Visit: Payer: Self-pay | Admitting: Hematology and Oncology

## 2022-03-28 MED ORDER — HYDROCOD POLI-CHLORPHE POLI ER 10-8 MG/5ML PO SUER
5.0000 mL | Freq: Two times a day (BID) | ORAL | 0 refills | Status: DC
  Filled 2022-03-28: qty 115, 12d supply, fill #0

## 2022-03-28 NOTE — Telephone Encounter (Signed)
Pt called to request refill for chlorpheniramine hydrocodone cough suppressant. Advised pt I made MD aware of request for refill. She is aware and verbalized thanks.

## 2022-03-29 ENCOUNTER — Other Ambulatory Visit (HOSPITAL_COMMUNITY): Payer: Self-pay

## 2022-04-10 NOTE — Progress Notes (Signed)
Patient Care Team: Thana Ates, MD as PCP - General (Internal Medicine) Domenick Gong, MD as Referring Physician (Emergency Medicine)  DIAGNOSIS:  Encounter Diagnoses  Name Primary?   Malignant neoplasm of upper-inner quadrant of left breast in female, estrogen receptor negative (HCC)    Toxic cardiomyopathy (HCC) Yes    SUMMARY OF ONCOLOGIC HISTORY: Oncology History  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 Ki-67 was 70% HER-2 was amplified with a ratio 2.91   07/12/2012 - 07/17/2013 Neo-Adjuvant Chemotherapy   TCH 6 followed by Herceptin maintenance   12/11/2012 Surgery   Left breast lumpectomy: 1.8 cm tumor 1 positive sentinel node, axillary lymph node dissection 02/08/2013 showed 0/13 lymph nodes   03/25/2013 - 05/06/2013 Radiation Therapy   Adjuvant radiation therapy   06/05/2013 - 07/20/2017 Anti-estrogen oral therapy   Tamoxifen 20 mg daily   07/27/2017 Relapse/Recurrence   MRI Brain: 3.4 x 2.9 x 2.9 cm RIGHT frontal lobe mass with imaging characteristics of solitary metastasis. Extensive vasogenic edema resulting in 9 mm RIGHT to LEFT midline shift. Equivocal very early LEFT ventricle entrapment.    08/04/2017 Surgery   Rt frontal brain resection: Poorly differentiated tumor IHC suggests breast primary ER and PR Positive   08/25/2017 - 09/04/2017 Radiation Therapy   Stereotactic radiation   09/18/2017 -  Anti-estrogen oral therapy   Lapatinib with letrozole   12/18/2017 - 12/19/2017 Radiation Therapy   New right parietal lobe metastases status post [facility]   08/27/2018 - 08/27/2020 Radiation Therapy   SRS to new brain metastases   05/09/2019 Relapse/Recurrence   Interval increase in size of the enhancing nodular left internal mammary soft  tissue 2.8 cm.  Redemonstrated enlarged supraclavicular, lower cervical and lower posterior cervical nodes unchanged.  Interval increase in the bony erosion of the posterior and lateral left third rib, increasing soft tissue lesion eroding the left sternal body 3.1 cm was 2.5 cm.  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 with the above-mentioned history of metastatic breast cancer currently on treatment with Xeloda and Herceptin. She presents to the clinic today for follow-up. She reports she has been doing well. The cough has subsided.   ALLERGIES:  is allergic to aspirin, protonix [pantoprazole], doxycycline, promethazine-codeine, and iodinated contrast media.  MEDICATIONS:  Current Outpatient Medications  Medication Sig Dispense Refill   acetaminophen (TYLENOL) 325 MG tablet Take 2 tablets (650 mg total) by mouth every 6 (six) hours as needed for mild pain (or Fever >/= 101). 12 tablet 0   albuterol (VENTOLIN HFA) 108 (90 Base) MCG/ACT inhaler Inhale 2 puffs into the lungs every 6 (six) hours as needed for  wheezing or shortness of breath (cough). 18 g 5   chlorpheniramine-HYDROcodone (TUSSIONEX) 10-8 MG/5ML Take 5 mLs by mouth 2 times daily. 115 mL 0   cholecalciferol (VITAMIN D3) 25 MCG (1000 UNIT) tablet Take 1,000 Units by mouth daily.     Fluticasone-Salmeterol,sensor, (AIRDUO  DIGIHALER) 113-14 MCG/ACT AEPB Inhale 1 puff into the lungs 2 (two) times daily. 1 each 4   lidocaine-prilocaine (EMLA) cream APPLY EXTERNALLY TO THE AFFECTED AREA 1 TIME 30 g 3   Multiple Vitamins-Minerals (EMERGEN-C VITAMIN C PO) Take by mouth.     predniSONE (DELTASONE) 50 MG tablet Take prednisone 50 mg 13 hours, 7 hours, and 1 hour prior to CT scan. (Patient not taking: Reported on 02/23/2022) 3 tablet 0   Vitamin E 200 units TABS Take by mouth.     voriconazole (VFEND) 200 MG tablet Take 1 tablet (200 mg total) by mouth 2 (two) times daily. 60 tablet 11   No current facility-administered medications for this visit.   Facility-Administered Medications Ordered in Other Visits  Medication Dose Route Frequency Provider Last Rate Last Admin   alteplase (CATHFLO ACTIVASE) injection 2 mg  2 mg Intracatheter Once PRN Nicholas Lose, MD       sodium chloride flush (NS) 0.9 % injection 10 mL  10 mL Intracatheter PRN Nicholas Lose, MD   10 mL at 07/06/21 1428    PHYSICAL EXAMINATION: ECOG PERFORMANCE STATUS: 1 - Symptomatic but completely ambulatory  Vitals:   04/12/22 0835  BP: 129/67  Pulse: 76  Resp: 18  Temp: 97.8 F (36.6 C)  SpO2: 98%   Filed Weights   04/12/22 0835  Weight: 105 lb 8 oz (47.9 kg)      LABORATORY DATA:  I have reviewed the data as listed    Latest Ref Rng & Units 02/28/2022    9:32 AM 01/18/2022   10:36 AM 12/07/2021   12:31 PM  CMP  Glucose 70 - 99 mg/dL 100  100  96   BUN 6 - 20 mg/dL $Remove'12  13  16   'atxlTyc$ Creatinine 0.44 - 1.00 mg/dL 0.73  0.75  0.79   Sodium 135 - 145 mmol/L 139  139  136   Potassium 3.5 - 5.1 mmol/L 4.1  4.2  4.2   Chloride 98 - 111 mmol/L 106  106  102   CO2 22 - 32 mmol/L $RemoveB'29  28  30   'svNNsgDX$ Calcium 8.9 - 10.3 mg/dL 10.0  9.9  9.6   Total Protein 6.5 - 8.1 g/dL 7.4  7.7  7.3   Total Bilirubin 0.3 - 1.2 mg/dL 0.2  0.2  0.2   Alkaline Phos 38 - 126 U/L 72  74  67   AST 15 - 41 U/L $Remo'19  18  17   'QjHhQ$ ALT 0 - 44 U/L $Remo'13  15  12     'pfzMJ$ Lab Results   Component Value Date   WBC 9.0 04/12/2022   HGB 12.4 04/12/2022   HCT 35.8 (L) 04/12/2022   MCV 93.0 04/12/2022   PLT 299 04/12/2022   NEUTROABS 5.4 04/12/2022    ASSESSMENT & PLAN:  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 ---s/p Lumpectomy/ /Initial Ca 1997, Brain Mets 2019 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  [place] brain: 08/25/2017-09/04/2017 2. Anti Her 2 therapy with Lapatinib started 09/17/2017-05/14/2019: Stopped for progression 3.  I discontinued tamoxifen and started her on letrozole 2.5 mg daily.  05/14/2019 stopped for progression 4.  Stereotactic radiosurgery 12/19/2017 to the new right parietal lobe metastases. 5.  Kadcyla: Received 16 cycles discontinued 05/05/2020 6.  Tucatinib Xeloda: Discontinued because of hand-foot syndrome 06/11/2020-12/15/2020 7. Herceptin/Neratinib: unable to tolerate due to diarrhea and weight loss 8.  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  The spot on the liver noted on the CT scan did not have any activity on the PET scan.  Postinflammatory changes in the lungs chronic sequelae of atypical infection   Brain MRI 10/01/2021: Progression of disease.  2 new lesions, total of 9 enhancing brain metastases, SRS completed by Dr. Tammi Klippel   Based on this we decided to continue with Herceptin maintenance alone.  The only other option would be Margetuximab.   Plan: Perform CT scans in December for monitoring metastatic breast cancer      Orders Placed This Encounter  Procedures   ECHOCARDIOGRAM COMPLETE    Standing Status:   Future    Standing Expiration Date:   04/13/2023    Order Specific Question:   Where should this test be performed    Answer:   [facility]    Order Specific Question:   Perflutren DEFINITY (image enhancing agent) should be administered unless hypersensitivity or allergy exist    Answer:   Administer Perflutren    Order Specific Question:   Reason for exam-Echo    Answer:   Chemo  Z09    Order Specific Question:   Other Comments    Answer:   pt needs autherization call patient to schedule   The patient has a good understanding of the overall plan. she agrees with it. she will call with any problems that may develop before the next visit here. Total time spent: 30 mins including face to face time and time spent for planning, charting and co-ordination of care   Harriette Ohara, MD 04/12/22    I Gardiner Coins am scribing for Dr. Lindi Adie  I have reviewed the above documentation for accuracy and completeness, and I agree with the above.

## 2022-04-12 ENCOUNTER — Inpatient Hospital Stay: Payer: 59

## 2022-04-12 ENCOUNTER — Inpatient Hospital Stay: Payer: 59 | Admitting: Hematology and Oncology

## 2022-04-12 ENCOUNTER — Inpatient Hospital Stay: Payer: 59 | Attending: Medical

## 2022-04-12 ENCOUNTER — Other Ambulatory Visit: Payer: Self-pay

## 2022-04-12 VITALS — BP 129/67 | HR 76 | Temp 97.8°F | Resp 18 | Ht 61.0 in | Wt 105.5 lb

## 2022-04-12 VITALS — BP 105/67 | HR 78 | Temp 98.5°F | Resp 19

## 2022-04-12 DIAGNOSIS — C7931 Secondary malignant neoplasm of brain: Secondary | ICD-10-CM | POA: Diagnosis not present

## 2022-04-12 DIAGNOSIS — Z95828 Presence of other vascular implants and grafts: Secondary | ICD-10-CM

## 2022-04-12 DIAGNOSIS — C787 Secondary malignant neoplasm of liver and intrahepatic bile duct: Secondary | ICD-10-CM | POA: Insufficient documentation

## 2022-04-12 DIAGNOSIS — C50212 Malignant neoplasm of upper-inner quadrant of left female breast: Secondary | ICD-10-CM | POA: Insufficient documentation

## 2022-04-12 DIAGNOSIS — Z171 Estrogen receptor negative status [ER-]: Secondary | ICD-10-CM

## 2022-04-12 DIAGNOSIS — C7951 Secondary malignant neoplasm of bone: Secondary | ICD-10-CM | POA: Diagnosis not present

## 2022-04-12 DIAGNOSIS — Z5112 Encounter for antineoplastic immunotherapy: Secondary | ICD-10-CM | POA: Insufficient documentation

## 2022-04-12 DIAGNOSIS — I427 Cardiomyopathy due to drug and external agent: Secondary | ICD-10-CM

## 2022-04-12 LAB — CMP (CANCER CENTER ONLY)
ALT: 13 U/L (ref 0–44)
AST: 16 U/L (ref 15–41)
Albumin: 4 g/dL (ref 3.5–5.0)
Alkaline Phosphatase: 71 U/L (ref 38–126)
Anion gap: 3 — ABNORMAL LOW (ref 5–15)
BUN: 13 mg/dL (ref 6–20)
CO2: 29 mmol/L (ref 22–32)
Calcium: 9.4 mg/dL (ref 8.9–10.3)
Chloride: 106 mmol/L (ref 98–111)
Creatinine: 0.7 mg/dL (ref 0.44–1.00)
GFR, Estimated: >60 mL/min (ref >60)
Glucose, Bld: 96 mg/dL (ref 70–99)
Potassium: 4.1 mmol/L (ref 3.5–5.1)
Sodium: 138 mmol/L (ref 135–145)
Total Bilirubin: 0.2 mg/dL — ABNORMAL LOW (ref 0.3–1.2)
Total Protein: 7.2 g/dL (ref 6.5–8.1)

## 2022-04-12 LAB — CBC WITH DIFFERENTIAL (CANCER CENTER ONLY)
Abs Immature Granulocytes: 0.02 10*3/uL (ref 0.00–0.07)
Basophils Absolute: 0.1 10*3/uL (ref 0.0–0.1)
Basophils Relative: 1 %
Eosinophils Absolute: 0.6 10*3/uL — ABNORMAL HIGH (ref 0.0–0.5)
Eosinophils Relative: 7 %
HCT: 35.8 % — ABNORMAL LOW (ref 36.0–46.0)
Hemoglobin: 12.4 g/dL (ref 12.0–15.0)
Immature Granulocytes: 0 %
Lymphocytes Relative: 23 %
Lymphs Abs: 2.1 10*3/uL (ref 0.7–4.0)
MCH: 32.2 pg (ref 26.0–34.0)
MCHC: 34.6 g/dL (ref 30.0–36.0)
MCV: 93 fL (ref 80.0–100.0)
Monocytes Absolute: 0.8 10*3/uL (ref 0.1–1.0)
Monocytes Relative: 9 %
Neutro Abs: 5.4 10*3/uL (ref 1.7–7.7)
Neutrophils Relative %: 60 %
Platelet Count: 299 10*3/uL (ref 150–400)
RBC: 3.85 MIL/uL — ABNORMAL LOW (ref 3.87–5.11)
RDW: 16 % — ABNORMAL HIGH (ref 11.5–15.5)
WBC Count: 9 10*3/uL (ref 4.0–10.5)
nRBC: 0 % (ref 0.0–0.2)

## 2022-04-12 LAB — MAGNESIUM: Magnesium: 1.9 mg/dL (ref 1.7–2.4)

## 2022-04-12 MED ORDER — HEPARIN SOD (PORK) LOCK FLUSH 100 UNIT/ML IV SOLN
500.0000 [IU] | Freq: Once | INTRAVENOUS | Status: AC | PRN
  Administered 2022-04-12: 500 [IU]

## 2022-04-12 MED ORDER — TRASTUZUMAB-DKST CHEMO 150 MG IV SOLR
6.0000 mg/kg | Freq: Once | INTRAVENOUS | Status: AC
  Administered 2022-04-12: 294 mg via INTRAVENOUS
  Filled 2022-04-12: qty 14

## 2022-04-12 MED ORDER — SODIUM CHLORIDE 0.9% FLUSH
10.0000 mL | INTRAVENOUS | Status: DC | PRN
  Administered 2022-04-12: 10 mL

## 2022-04-12 MED ORDER — DIPHENHYDRAMINE HCL 25 MG PO CAPS
50.0000 mg | ORAL_CAPSULE | Freq: Once | ORAL | Status: AC
  Administered 2022-04-12: 25 mg via ORAL
  Filled 2022-04-12: qty 2

## 2022-04-12 MED ORDER — ACETAMINOPHEN 325 MG PO TABS
650.0000 mg | ORAL_TABLET | Freq: Once | ORAL | Status: AC
  Administered 2022-04-12: 650 mg via ORAL
  Filled 2022-04-12: qty 2

## 2022-04-12 MED ORDER — SODIUM CHLORIDE 0.9 % IV SOLN: Freq: Once | INTRAVENOUS | Status: AC

## 2022-04-12 NOTE — Assessment & Plan Note (Signed)
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 Anti Her 2 therapy with Lapatinibstarted 09/17/2017-05/14/2019: Stopped for progression 3.I discontinuedtamoxifen and started her on letrozole 2.5 mg daily.05/14/2019 stopped for progression 4.Stereotactic radiosurgery 12/19/2017 to the new right parietal lobe metastases. 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 The spot on the liver noted on the CT scan did not have any activity on the PET scan. Postinflammatory  changes in the lungs chronic sequelae of atypical infection  Brain MRI 10/01/2021: Progression of disease.2new lesions, total of 9 enhancing brain metastases, SRS completed by Dr. Manning  Based on this we decided to continue with Herceptin maintenance alone. The only other option would be Margetuximab.  Plan: Perform CT scans in December for monitoring metastatic breast cancer 

## 2022-04-12 NOTE — Patient Instructions (Signed)
[facility]COL[facility]  Discharge Instructions: Thank you for choosing [facility] to provide your oncology and hematology care.   If you have a lab appointment with the Cancer Center, please go directly to the Cancer Center and check in at the registration area.   Wear comfortable clothing and clothing appropriate for easy access to any Portacath or PICC line.   We strive to give you quality time with your provider. You may need to reschedule your appointment if you arrive late (15 or more minutes).  Arriving late affects you and other patients whose appointments are after yours.  Also, if you miss three or more appointments without notifying the office, you may be dismissed from the clinic at the provider's discretion.      For prescription refill requests, have your pharmacy contact our office and allow 72 hours for refills to be completed.    Today you received the following chemotherapy and/or immunotherapy agents: Trastuzumab.       To help prevent nausea and vomiting after your treatment, we encourage you to take your nausea medication as directed.  BELOW ARE SYMPTOMS THAT SHOULD BE REPORTED IMMEDIATELY: *FEVER GREATER THAN 100.4 F (38 C) OR HIGHER *CHILLS OR SWEATING *NAUSEA AND VOMITING THAT IS NOT CONTROLLED WITH YOUR NAUSEA MEDICATION *UNUSUAL SHORTNESS OF BREATH *UNUSUAL BRUISING OR BLEEDING *URINARY PROBLEMS (pain or burning when urinating, or frequent urination) *BOWEL PROBLEMS (unusual diarrhea, constipation, pain near the anus) TENDERNESS IN MOUTH AND THROAT WITH OR WITHOUT PRESENCE OF ULCERS (sore throat, sores in mouth, or a toothache) UNUSUAL RASH, SWELLING OR PAIN  UNUSUAL VAGINAL DISCHARGE OR ITCHING   Items with * indicate a potential emergency and should be followed up as soon as possible or go to the Emergency Department if any problems should occur.  Please show the CHEMOTHERAPY ALERT CARD or IMMUNOTHERAPY ALERT CARD at check-in  to the Emergency Department and triage nurse.  Should you have questions after your visit or need to cancel or reschedule your appointment, please contact [facility]  Dept: [phone]  and follow the prompts.  Office hours are 8:00 a.m. to 4:30 p.m. Monday - Friday. Please note that voicemails left after 4:00 p.m. may not be returned until the following business day.  We are closed weekends and major holidays. You have access to a nurse at all times for urgent questions. Please call the main number to the clinic Dept: [phone] and follow the prompts.   For any non-urgent questions, you may also contact your provider using MyChart. We now offer e-Visits for anyone 18 and older to request care online for non-urgent symptoms. For details visit [email or web address].   Also download the MyChart app! Go to the app store, search "MyChart", open the app, select [place], and log in with your MyChart username and password.  Masks are optional in the cancer centers. If you would like for your care team to wear a mask while they are taking care of you, please let them know. You may have one support person who is at least 54 years old accompany you for your appointments. 

## 2022-04-12 NOTE — Progress Notes (Signed)
New careplan entered, hence dose increase with updated weight.  MD confirmed '6mg'$ /kg is continued dose.

## 2022-04-13 ENCOUNTER — Other Ambulatory Visit: Payer: Self-pay | Admitting: Pharmacist

## 2022-04-13 ENCOUNTER — Ambulatory Visit
Admission: RE | Admit: 2022-04-13 | Discharge: 2022-04-13 | Disposition: A | Discharge status: Home / Self Care | Payer: 59 | Source: Ambulatory Visit | Attending: Radiation Oncology | Admitting: Radiation Oncology

## 2022-04-13 ENCOUNTER — Telehealth: Payer: Self-pay | Admitting: Hematology and Oncology

## 2022-04-13 DIAGNOSIS — C7931 Secondary malignant neoplasm of brain: Secondary | ICD-10-CM

## 2022-04-13 MED ORDER — GADOPICLENOL 0.5 MMOL/ML IV SOLN
5.0000 mL | Freq: Once | INTRAVENOUS | Status: AC | PRN
  Administered 2022-04-13: 5 mL via INTRAVENOUS

## 2022-04-13 MED ORDER — HEPARIN SOD (PORK) LOCK FLUSH 100 UNIT/ML IV SOLN
500.0000 [IU] | Freq: Once | INTRAVENOUS | Status: AC
  Administered 2022-04-13: 500 [IU] via INTRAVENOUS

## 2022-04-13 MED ORDER — SODIUM CHLORIDE 0.9% FLUSH
10.0000 mL | INTRAVENOUS | Status: DC | PRN
  Administered 2022-04-13: 10 mL via INTRAVENOUS

## 2022-04-13 NOTE — Telephone Encounter (Signed)
Scheduled appointment per 10/3 los as well as the [place]. Patient is aware.

## 2022-04-18 ENCOUNTER — Inpatient Hospital Stay: Payer: 59

## 2022-04-19 ENCOUNTER — Encounter: Payer: Self-pay | Admitting: Urology

## 2022-04-19 ENCOUNTER — Ambulatory Visit
Admission: RE | Admit: 2022-04-19 | Discharge: 2022-04-19 | Disposition: A | Discharge status: Home / Self Care | Payer: 59 | Source: Ambulatory Visit | Attending: Urology | Admitting: Urology

## 2022-04-19 DIAGNOSIS — Z170 Estrogen receptor positive status [ER+]: Secondary | ICD-10-CM

## 2022-04-19 NOTE — Progress Notes (Signed)
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       ICD-10-CM   1. Malignant neoplasm of upper-inner quadrant of left breast in female, estrogen receptor positive ([place])  C50.212    Z17.0       Interval Since Last Radiation:  6 months 10/12/21: The 3 mm target in the right pons was treated to a prescription dose of 20 G in a single fraction.  06/29/21: These two targets were treated to a prescription dose of 20 G in a single fraction.   03/17/21: These three targets were treated to a prescription dose of 20 G in a single fraction.   08/27/20: These five targets were treated to a prescription dose of 20 G in a single fraction.    05/13/20:  SRS brain to the following 4 targets treated to a prescription dose of 20 Gy in a single fraction with a single isocenter:  PTV6 Mid Cerebellum 26mm PTV7 Lt Occipital 78mm PTV8 Lt Occipital 31mm PTV9 Lt Temporal 72mm   03/25/2019:  SRS brain//PTV 5:  Right frontal 5 mm target was treated to a prescription dose of 20 Gy in a single fraction.   09/04/2018:   SRS Brain// Right Frontal, 2 targets / 20 Gy in 1 fraction PTV3: Ant Rt Frontal 34mm 20Gy PTV4: Rt Frontal resection cavity 27mm  20Gy  12/18/2017: SRS brain//PTV2: 4 mm Rt Parietal lesion treated to 20 Gy in 1 Fx  08/25/2017, 08/28/2017, 08/30/2017, 09/01/2017, 09/04/2017: PTV1: post op SRS to right frontal lobe resection cavity in 5 fxs  Narrative:  In summary, she initially presented to the emergency Department on 07/27/2017 with complaints of headaches ongoing for approximately 5 weeks with associated nausea and vomiting. She was initially treated by her PCP for sinusitis without improvement.  She also  has a history of migraine headaches and had ben evaluated in the ED at [facility] on 06/20/2017 for treatment of what she thought was a typical migraine headache given the fact that she had her usual blurry vision and aura preceding the headache. She reports that the headache did improve with treatment but the relief was short-lived as the headache returned the very next day. She followed up with her primary care physician and a CT of the head was going to be scheduled due to the persistent headaches but in the interim, she presented to the Emergency Department at [facility] due to increased severity of the headache with associated nausea and vomiting. The headaches were occuring in the frontal lobe as well as bilateral temporal with occasional radiation to the occipital lobe and associated with blurry vision, decreased appetite, fatigue, nausea and vomiting.  She denied any difficulty with speech, memory, imbalance or focal weakness.   CT Head on admission 07/27/2017 showed a large right frontal lobe mass measuring 3.1 by 2.5 cm with significant surrounding edema with mass effect and right to left midline shift measuring 8 mm. A subsequent Brain MRI showed a 3.4 x 2.9 x 2.9 cm right frontal lobe mass with imaging characteristics of solitary metastasis and extensive vasogenic edema resulting in 9 mm right to left midline shift as well as equivocal very early  left ventricle entrapment.     CT Chest, Abdomen, Pelvis on 07/28/2017 for disease staging showed no findings to suggest metastatic breast cancer involving the chest, abdomen, pelvis or osseous structures. 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 Surgical changes involving the left breast and left axilla but no findings for local recurrence or regional adenopathy.  She proceeded with a gross total resection of the frontal lesion on 08/05/17 with Dr. Cyndy Freeze followed by adjuvant fractionated postop SRS  radiotherapy to the resection cavity in February 2019. Final surgical pathology revealed poorly differentiated adenocarcinoma consistent with metastatic breast cancer, ER/PR positive and HER-2 positive.   She tolerated SRS treatment well and initial post treatment MRI brain on 12/07/17 showed satisfactory appearance of the right anterior frontal lobe post treatment site with expected evolution but unfortunately also showed a single, new 2 - 3 mm lesion in the right parietal lobe without associated edema or mass-effect.  She elected to undergo salvage SRS treatment to the new lesion in the parietal lobe which was completed on 12/18/17 and tolerated well.  A follow up brain MRI on 05/18/18 showed a stable right frontal resection cavity with stable 4 mm nodular enhancement at the inferior cavity margin but no evidence of new disease.  Unfortunately, the follow up MRI brain scan on 08/23/2018 showed a new 11 mm inferior right frontal lesion with only mild associated edema and no mass-effect.  She reported that she had been having more frequent frontal headaches which were not severe and responded to Aleve and otherwise, had been feeling well.  She elected to proceed with a single fraction of SRS to this new brain metastasis as recommended and this was completed on  09/04/18 and tolerated very well.   She had bronchoscopy with Dr. Valeta Harms in 05/2018 and dx'ed with Aspergillus- ID following (Dr. Graylon Good).  She also continues in routine follow up with Dr. Melvyn Novas in Pulmonology.  Follow up MRI brain scan from 03/08/19 showed continued interval enlargement of the nodular focus with enhancement at the superior margin of the right frontal resection cavity, measuring 5 x 6 mm in diameter compared with 3 x 4.5 mm on the previous study. Two smaller foci of enhancement along the inferior margin were unchanged and there was no evidence of increasing mass effect or edema and no new lesions seen elsewhere within the brain.  She elected to  proceed with SRS treatment of the progressive nodular lesion which was completed on 03/25/19 and tolerated well without any acute ill effects.    She developed systemic disease progression, particularly in the chest with increase in size of enhancing nodular left internal mammary soft tissue mass, lymph nodes and left third rib lesion and soft tissue lesion eroding the left sternal body as well as a new, hypermetabolic 6.5 cm liver lesion seen on PET scan from 05/23/2019 despite continuing on lapatinib with Letrozole. She did have an ultrasound-guided biopsy of the liver mass on 06/05/2019 which confirmed metastatic breast cancer, HER-2 positive, ER/PR negative. Her systemic therapy was changed to [place] beginning on 06/19/2019, under the care and direction of Dr. Lindi Adie which she tolerated very well.  A follow-up CT chest from 07/19/2019 showed a positive response to treatment with decrease in size of the soft tissue mass in the breast as well as bony lesions and adenopathy.  Fortunately, her follow-up MRI brain from 08/02/2019 showed stability of the previously treated disease and no new or progressive findings. A repeat MRI  brain scan 11/04/19 was felt to be overall stable but there was a slight change in a previously treated right inferior frontal lesion, measuring 5 mm, previously 4 mm, with a new, separate punctate focus of enhancement just posterior to that. This was such minimal change and when her prior treatment fields were fused with recent scan, it appeared that the new area of enhancement was on the field border of GTV3 and would have received a large portion of the delivered dose.  Therefore, consensus recommendation was to simply monitor this area with routine 3 month follow up brain imaging. She had repeat MRI on 01/30/20 and this showed overall disease stability with an unchanged appearance of the 2 previously treated right frontal lobe deposits and no new or progressive disease. Repeat systemic  disease staging scans were performed on 01/29/20 and 02/04/20.  The bone scan on 01/29/20 showed less uptake of tracer at the previously identified sternal and LEFT third rib lesions, consistent with response to therapy and there were no new sites of osseous metastasis noted. The CT C/A/P performed on 02/04/20 also showed disease stability with a new cluster of spiculated nodule or opacities in the posterior left lung favoring aspergillosis or other atypical infectious etiology.  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  There was no definite evidence of metastatic disease within the abdomen or pelvis. She continued on Kadcycla, s/p 14 cycles as of 05/05/20.   Repeat MRI brain from 05/01/20 showed 4 new metastatic lesions, all subcentimeter with a 28mm midline cerebellar vermian metastasis, 35mm left occipital metastasis, 67mm right occipital metastasis and a punctate lesion in the left temporal lobe.  The concensus recommendation was to proceed with a single fraction of SRS to the 4 new metastases which she was in agreement with and was completed on 05/13/21.  Her restaging systemic imaging from 05/28/20 was without evidence of any disease progression or recurrence in the chest, abdomen or pelvis.  At the time of her follow up with Dr. Lindi Adie on 05/05/2020, the decision was made to discontinue the Kadcycla, due to disease progression in the brain, and switch to Tucatinib, oral systemic therapy in combination with Herceptin and Xeloda which was started 06/11/20.  Her follow up brain MRI from 08/13/20 unfortunately showed disease progression with 5 new lesions: with a 2 mm left frontal lesion, 2 mm right frontal lesion, 2 mm right occipital lesion, 2 mm right temporal lesion and a 5 mm left temporal lesion.  The 1 mm right occipital lesion was initially thought to be  new but on further review of prior treatment plans, it was noted to have been treated previously and stable.  Otherwise, the other previously treated lesions were improved and/or stable.  The consensus recommendation at brain conference was to proceed with a single fraction of SRS to the 5 new brain lesions which she was in agreement with and was completed on 08/27/20.  She tolerated the treatment very well without any ill side effects aside from some low grade nausea for the first 48 hours.   In May 2022, her surveillance MRI showed no new lesions.  However, her follow up brain MRI on 03/05/21 showed 2 new brain mets and a 3rd enlarging brain met which has not previously been treated. She has been on hold for Tucatinib due to cutaneous reactions but fortunately, her restaging systemic  imaging with CT C/A/P from 02/26/21 was without any evidence of new or progressive disease in the chest and no evidence of metastatic disease involving the abdomen or pelvis. We met with her on 03/11/21 to review these findings and discuss the consensus recommendation from brain conference to proceed with a single fraction of SRS to the three new brain lesions which she was in agreement with and was completed on 03/17/21. A follow up MRI brain on 06/17/21 showed 2 new lesions in the left occipital measuring 5 mm and 4 mm.  Her imaging was reviewed in the multidisciplinary brain conference and again the consensus recommendation was to proceed with a single fraction of SRS to treat the 2 new lesions.  We met with her on 06/23/2021 she was in agreement to proceed and the treatment was completed on 06/29/2021. She tolerated the [facility] treatment well and had been off systemic therapy for a month in 06/2021 due to intolerance of GI side effects with neratinib (Nerlynx) in conjunction with herceptin.  Her disease restaging CT C/A/P performed on 07/22/2021 showed a retracted appearing, hypodense lesion adjacent to the gallbladder fossa, hepatic segment  V measuring 1.7 x 1.6 cm but no other evidence of new metastatic disease in the chest, abdomen or pelvis.  Fortunately, the PET scan on 08/13/2021 for further evaluation of the new lesion showed no specific findings to suggest recurrent FDG avid tumor or metastatic disease to the chest, abdomen or pelvis and specifically, no abnormal increased radiotracer uptake within the liver to correspond to the treated liver lesion adjacent to the gallbladder fossa.  At the time of her follow-up with Dr. Lindi Adie on 08/16/2021 the recommendation was to continue with Herceptin maintenance alone, as the only other option would be Margetuximab. Her surveillance MRI brain from 09/29/21 was reviewed in our brain tumor conference on 10/04/21 along with review of her previous brain MRIs.  We also fused her current brain MRI with contrast with a composite radiation distribution representing all of her previous 8 courses of stereotactic radiosurgery.  Then, we reviewed the 5 lesions of concern on the recent MRI with 2 being described as new and 3 being described as larger.  Ultimately, it only appeared that one 3 mm enhancing lesion on the right side of the pons with surrounding edema represented a true new brain metastasis.  The punctate lesion in the cerebellar vermis was previously treated with stereotactic radiosurgery and confirmed that the 3 larger lesions had all been treated with SRS within the past 2 years suggesting that the enlargement may represent an adverse radiation effect such as radiation necrosis more likely than local recurrence. Therefore, the consensus recommendation was to proceed with a single fraction of salvage stereotactic radiosurgery [facility]) to the right pontine brain metastasis, especially given its critical location in the brain, followed by continued MRI surveillance of the other treated and untreated portions of the brain.    We met with her on 10/07/21 to discuss recommendations and she was in agreement to  proceed. Her single fraction SRS was completed 10/12/21 and tolerated very well as per usual. Her follow up MRI brain scan from 01/06/22 showed an interval increase in size of 3 lesions, including the most recently treated lesion in the right pons, but no new lesions. This scan was fused with her prior treatment plans and reviewed in multidisciplinary conference on 01/17/22, confirming that the lesions had been previously treated. Consensus recommendation was to continue with monitoring with a repeat MRI brain scan in  3 months.  Interval History: She has recovered well from the effects of her most recent [facility] treatment and remains without complaints.  Her most recent MRI brain scan from 04/13/2022 demonstrated several areas with increased contrast enhancement and some with interval increase in size as compared to her prior study.  There is also a new contrast-enhancing lesion in the left inferior mastoid air cells on the left which could just be inflammatory.  This study was fused with her prior treatment plans and reviewed at the multidisciplinary brain conference on 04/18/2022 with consensus recommendation to continue monitoring with a repeat MRI brain scan in 3 months as these lesions all appear to have been previously treated or in very close proximity to previously treated tissue.  No intervention was felt to be necessary at this time.  We reviewed her scan and recommendations today by telephone.    On review of systems, the patient states that she is doing well overall.  She has fully recovered from a bout of pneumonia which required hospitalization overnight in 07/2021.  Currently, she is without complaints aside from fatigue and chronic cough.  She has seen Dr. 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  She continues in routine follow-up under the care  and direction of Dr. Tommy Medal, here locally.  She denies headaches, decreased visual or auditory acuity, tinnitus, tremor, focal weakness or seizure activity.  She does have occasional blurry vision associated with her voriconazole treatment and this has not been progressive or changed recently.  She is not having difficulty with speech or word finding. She denies abdominal pain, N/V or diarrhea. She reports a healthy appetite and has been able to regain some of the weight she had recently lost due to the intolerance of the systemic therapy with xeloda/herceptin/tucatanib.  She continues in routine follow-up under the care and direction of Dr. Lindi Adie for continued management of her systemic disease and is currently on herceptin maintenance only and is tolerating this well.  Her most recent PET for disease restaging on 08/12/2021 was without any findings to suggest recurrent or metastatic disease to the chest, abdomen or pelvis and the restaging CT C/A/P from 12/16/21 also confirmed disease stability so she has continued on Herceptin maintenance only.  At her most recent follow-up visit with Dr. Lindi Adie on 04/12/2022, the plan is to obtain repeat imaging for systemic restaging in December 2023.  ALLERGIES:  is allergic to aspirin, protonix [pantoprazole], doxycycline, promethazine-codeine, and iodinated contrast media.  Meds: Current Outpatient Medications  Medication Sig Dispense Refill   acetaminophen (TYLENOL) 325 MG tablet Take 2 tablets (650 mg total) by mouth every 6 (six) hours as needed for mild pain (or Fever >/= 101). 12 tablet 0   albuterol (VENTOLIN HFA) 108 (90 Base) MCG/ACT inhaler Inhale 2 puffs into the lungs every 6 (six) hours as needed for wheezing or shortness of breath (cough). 18 g 5   chlorpheniramine-HYDROcodone (TUSSIONEX) 10-8 MG/5ML Take 5 mLs by mouth 2 times daily. 115 mL 0   cholecalciferol (VITAMIN D3) 25 MCG (1000 UNIT) tablet Take 1,000 Units by mouth daily.      Fluticasone-Salmeterol,sensor, (AIRDUO DIGIHALER) 113-14 MCG/ACT AEPB Inhale 1 puff into the lungs 2 (two) times daily. 1 each 4   lidocaine-prilocaine (EMLA) cream APPLY EXTERNALLY TO THE AFFECTED AREA 1 TIME 30 g 3   Multiple Vitamins-Minerals (EMERGEN-C VITAMIN  C PO) Take by mouth.     predniSONE (DELTASONE) 50 MG tablet Take prednisone 50 mg 13 hours, 7 hours, and 1 hour prior to CT scan. (Patient not taking: Reported on 02/23/2022) 3 tablet 0   Vitamin E 200 units TABS Take by mouth.     voriconazole (VFEND) 200 MG tablet Take 1 tablet (200 mg total) by mouth 2 (two) times daily. 60 tablet 11   No current facility-administered medications for this encounter.   Facility-Administered Medications Ordered in Other Encounters  Medication Dose Route Frequency Provider Last Rate Last Admin   alteplase (CATHFLO ACTIVASE) injection 2 mg  2 mg Intracatheter Once PRN Nicholas Lose, MD       sodium chloride flush (NS) 0.9 % injection 10 mL  10 mL Intracatheter PRN Nicholas Lose, MD   10 mL at 07/06/21 1428    Physical Findings: Unable to assess due to telephone follow up visit format.  Lab Findings: Lab Results  Component Value Date   WBC 9.0 04/12/2022   WBC 8.7 07/26/2021   HGB 12.4 04/12/2022   HGB 13.1 08/12/2014   HCT 35.8 (L) 04/12/2022   HCT 40.4 08/12/2014   PLT 299 04/12/2022   PLT 228 08/12/2014    Lab Results  Component Value Date   NA 138 04/12/2022   NA 142 08/12/2014   K 4.1 04/12/2022   K 4.6 08/12/2014   CHLORIDE 107 08/12/2014   CO2 29 04/12/2022   CO2 26 08/12/2014   GLUCOSE 96 04/12/2022   GLUCOSE 94 08/12/2014   GLUCOSE 98 12/20/2012   BUN 13 04/12/2022   BUN 10.1 08/12/2014   CREATININE 0.70 04/12/2022   CREATININE 0.88 03/11/2019   CREATININE 0.9 08/12/2014   BILITOT 0.2 (L) 04/12/2022   BILITOT 0.25 08/12/2014   ALKPHOS 71 04/12/2022   ALKPHOS 68 08/12/2014   AST 16 04/12/2022   AST 15 08/12/2014   ALT 13 04/12/2022   ALT 11 08/12/2014   PROT 7.2  04/12/2022   PROT 7.1 08/12/2014   ALBUMIN 4.0 04/12/2022   ALBUMIN 3.8 08/12/2014   CALCIUM 9.4 04/12/2022   CALCIUM 9.3 08/12/2014   ANIONGAP 3 (L) 04/12/2022    Radiographic Findings: MR Brain W Wo Contrast  Result Date: 04/15/2022 CLINICAL DATA:  MRI brain 01/07/2019 EXAM: MRI HEAD WITHOUT AND WITH CONTRAST TECHNIQUE: Multiplanar, multiecho pulse sequences of the brain and surrounding structures were obtained without and with intravenous contrast. CONTRAST:  5 ml Vueway COMPARISON:  01/06/22 Brain MRI FINDINGS: Brain: Redemonstrated postsurgical changes from bifrontal craniotomy. Compared to prior exam contrast enhancement in the T2/FLAIR hyperintense signal abnormality surrounding the resection cavity in the right frontal lobe is unchanged. The lesion in the high right frontal lobe is unchanged from prior exam measuring up to 1.2 x 1.2 cm. Contrast enhancing lesion in the inferomedial right frontal lobe is unchanged from prior (series 104, image 90). However there is new T2/FLAIR hyperintense signal abnormality involving the right insular cortex and the right temporal lobe. There is also increased T2/FLAIR hyperintense signal abnormality in the left occipital lobe and pons. These lesions also demonstrate interval increase in size on post contrast-enhanced sequences. - the lesion in the right temporal lobe now measures up to 2.1 x 1.8 cm, previously 1.3 by 1.1 cm (series 104, image 14). - the lesion in the left occipital lobe now measures up to 1.2 x 1.1 cm, previously 1.0 x 1.0 cm (series 07/15/1991). - the lesion in the pons now measures up to 0.7  x 0.4 cm, previously 0.5 x 0.4 cm. - compared to prior exam there are also now new contrast-enhancing lesions in the cerebellar vermis (series 104, image 105). No hemorrhage. No acute infarct. - there is likely also an additional new contrast enhancing lesion in the anteromedial left temporal lobe (series 104, image 113). - Punctate contrast enhancing  lesions in the left temporal lobe and left cerebellum are unchanged in size (series 104, image 112, 138, ). Vascular: Normal flow voids. Skull and upper cervical spine: New contrast enhancing lesion in the left inferior mastoid air cells (series 112, image 138). Sinuses/Orbits: Negative. Other: None. IMPRESSION: 1. New and growing contrast enhancing lesions, as above. Findings are concerning for worsening metastatic disease. The larger lesion in the left occiptial lobe now demonstrates susceptibility artifact, which could represent intralesional hemorrhage. 2. Unchanged appearance of the previously described contrast enhancing lesions in the right frontal lobe, left temporal lobe, and left cerebellum. 3. New focal area of contrast enhancement within the medial mastoid on the left may be inflammatory, but could also represent a site of osseous metastatic disease. Recommend attention non follow up. Electronically Signed   By: Marin Roberts M.D.   On: 04/15/2022 12:10   MM DIAG BREAST TOMO UNI LEFT  Result Date: 03/22/2022 CLINICAL DATA:  Recall from screening mammography, possible asymmetry in the inner breast at middle to posterior depth visible only on the exaggerated CC view. Personal history of malignant LEFT lumpectomy in June, 2014, with current history of widespread metastatic breast cancer for which she is currently undergoing chemotherapy. EXAM: DIGITAL DIAGNOSTIC UNILATERAL LEFT MAMMOGRAM WITH TOMOSYNTHESIS TECHNIQUE: Left digital diagnostic mammography and breast tomosynthesis was performed. COMPARISON:  Previous exam(s). ACR Breast Density Category c: The breast tissue is heterogeneously dense, which may obscure small masses. FINDINGS: Spot-compression CC view of the area of concern and a full field mediolateral view were obtained. The asymmetry in the inner breast at middle to posterior depth questioned on screening mammography disperses with compression, indicating overlapping fibroglandular tissue.  There is no underlying mass or architectural distortion. Scar/distortion at the lumpectomy site in the upper breast at posterior depth. No findings suspicious for malignancy. IMPRESSION: No mammographic evidence of malignancy involving the LEFT breast. RECOMMENDATION: Screening mammogram in one year.(Code:SM-B-01Y) I have discussed the findings and recommendations with the patient. If applicable, a reminder letter will be sent to the patient regarding the next appointment. BI-RADS CATEGORY  2: Benign. Electronically Signed   By: Evangeline Dakin M.D.   On: 03/22/2022 14:41   Impression/Plan: 55.         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  She appears to have recovered well from the effects of her recent SRS brain treatment and is currently without complaints.  We reviewed her most recent MRI brain scan from 04/13/22 and consensus recommendation from the multidisciplinary brain conference on 04/18/2022 which is to continue monitoring with a repeat MRI brain scan in 3 months as the lesions of concern all appear to have been previously treated or in very close proximity to previously treated tissue.  No intervention was felt to be necessary at this time.   Therefore, we will plan to repeat a brain MRI scan in January 2024 and I will plan to call her with those results and any recommendations from the multidisciplinary brain conference as soon as they are available.  She appears to have a good understanding of  these recommendations and is comfortable and in agreement.  She knows that she is welcome to call at anytime in the interim with any questions or concerns.  She will also continue in routine follow-up under the care and direction of Dr. Lindi Adie for continued monitoring and management of her systemic disease.    I personally spent 20 minutes in this encounter including chart review, reviewing radiological studies, telephone  conversation with the patient, entering orders and completing documentation.   Nicholos Johns, PA-C    Tyler Pita, MD  [facility] Direct Dial: [phone]  Fax: [phone] [email or web address]  Skype  LinkedIn

## 2022-04-19 NOTE — Progress Notes (Signed)
Nursing encounter for telephone f/u appointment in reference to LT breast, metastatic to brain. Patient reports headaches 3/10. No other issues reported at this time.  Meaningful use complete. No chances of pregnancy. MRI final- 04/15/22.  Patient aware of her 1:30pm-04/19/22 telephone appointment w/ Ashlyn Bruning PA-C. I left my extension [phone] in case patient needs anything. Patient verbalized understanding. This concludes the nursing interview.  Patient contact [phone]

## 2022-04-24 ENCOUNTER — Other Ambulatory Visit: Payer: Self-pay | Admitting: Hematology and Oncology

## 2022-04-25 ENCOUNTER — Other Ambulatory Visit: Payer: Self-pay | Admitting: Hematology and Oncology

## 2022-04-25 ENCOUNTER — Other Ambulatory Visit (HOSPITAL_COMMUNITY): Payer: Self-pay

## 2022-04-25 MED ORDER — HYDROCOD POLI-CHLORPHE POLI ER 10-8 MG/5ML PO SUER
5.0000 mL | Freq: Two times a day (BID) | ORAL | 0 refills | Status: DC
  Filled 2022-04-25: qty 115, 12d supply, fill #0

## 2022-05-02 ENCOUNTER — Other Ambulatory Visit: Payer: Self-pay

## 2022-05-02 DIAGNOSIS — C50212 Malignant neoplasm of upper-inner quadrant of left female breast: Secondary | ICD-10-CM

## 2022-05-02 DIAGNOSIS — C50912 Malignant neoplasm of unspecified site of left female breast: Secondary | ICD-10-CM

## 2022-05-02 MED ORDER — LIDOCAINE-PRILOCAINE 2.5-2.5 % EX CREA: TOPICAL_CREAM | CUTANEOUS | 3 refills | Status: DC

## 2022-05-02 NOTE — Progress Notes (Signed)
Patient Care Team: Charlane Ferretti, MD as PCP - General (Internal Medicine) [name], MD as Referring Physician (Emergency Medicine)  DIAGNOSIS: No diagnosis found.  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 Ki-67 was 70% HER-2 was amplified with a ratio 2.91   07/12/2012 - 07/17/2013 Neo-Adjuvant Chemotherapy   TCH 6 followed by Herceptin maintenance   12/11/2012 Surgery   Left breast lumpectomy: 1.8 cm tumor 1 positive sentinel node, axillary lymph node dissection 02/08/2013 showed 0/13 lymph nodes   03/25/2013 - 05/06/2013 Radiation Therapy   Adjuvant radiation therapy   06/05/2013 - 07/20/2017 Anti-estrogen oral therapy   Tamoxifen 20 mg daily   07/27/2017 Relapse/Recurrence   MRI Brain: 3.4 x 2.9 x 2.9 cm RIGHT frontal lobe mass with imaging characteristics of solitary metastasis. Extensive vasogenic edema resulting in 9 mm RIGHT to LEFT midline shift. Equivocal very early LEFT ventricle entrapment.    08/04/2017 Surgery   Rt frontal brain resection: Poorly differentiated tumor IHC suggests breast primary ER and PR Positive   08/25/2017 - 09/04/2017 Radiation Therapy   Stereotactic radiation   09/18/2017 -  Anti-estrogen oral therapy   Lapatinib with letrozole   12/18/2017 - 12/19/2017 Radiation Therapy   New right parietal lobe metastases status post [facility]   08/27/2018 - 08/27/2020 Radiation Therapy   SRS to new brain metastases   05/09/2019 Relapse/Recurrence   Interval increase in size of the enhancing nodular left internal mammary soft tissue 2.8 cm.  Redemonstrated enlarged supraclavicular, lower cervical and lower posterior cervical nodes unchanged.  Interval increase in the bony erosion of the  posterior and lateral left third rib, increasing soft tissue lesion eroding the left sternal body 3.1 cm was 2.5 cm.  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[place], Brain Mets 2019  06/10/2019 Initial Diagnosis   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([place])   06/19/2019 - 05/05/2020 Chemotherapy   ado-trastuzumab emtansine (KADCYLA)     Port-A-Cath in place    CHIEF COMPLIANT:   INTERVAL HISTORY: KEYSI OELKERS is a   ALLERGIES:  is allergic to aspirin, protonix [pantoprazole], doxycycline, promethazine-codeine, and iodinated contrast media.  MEDICATIONS:  Current Outpatient Medications  Medication Sig Dispense Refill   acetaminophen (TYLENOL) 325 MG tablet Take 2 tablets (650 mg total) by mouth every 6 (six) hours as needed for mild pain (or Fever >/= 101). 12 tablet 0   albuterol (VENTOLIN HFA) 108 (90 Base) MCG/ACT inhaler Inhale 2 puffs into the lungs every 6 (six) hours as needed for wheezing or shortness of breath (cough). 18 g 5   chlorpheniramine-HYDROcodone (TUSSIONEX) 10-8 MG/5ML Take 5 mLs by mouth 2 times daily. 115 mL 0   cholecalciferol (VITAMIN D3) 25 MCG (1000 UNIT) tablet Take 1,000 Units by mouth daily.     Fluticasone-Salmeterol,sensor, (AIRDUO DIGIHALER) 113-14 MCG/ACT AEPB Inhale 1 puff into the lungs 2 (two) times daily. 1 each 4   lidocaine-prilocaine (EMLA)  cream APPLY EXTERNALLY TO THE AFFECTED AREA 1 TIME 30 g 3   Multiple Vitamins-Minerals (EMERGEN-C VITAMIN C PO) Take by mouth.     predniSONE (DELTASONE) 50 MG tablet Take prednisone 50 mg 13 hours, 7 hours, and 1 hour prior to CT scan. (Patient not taking: Reported on 02/23/2022) 3 tablet 0   Vitamin E 200 units  TABS Take by mouth.     voriconazole (VFEND) 200 MG tablet Take 1 tablet (200 mg total) by mouth 2 (two) times daily. 60 tablet 11   No current facility-administered medications for this visit.   Facility-Administered Medications Ordered in Other Visits  Medication Dose Route Frequency Provider Last Rate Last Admin   alteplase (CATHFLO ACTIVASE) injection 2 mg  2 mg Intracatheter Once PRN Nicholas Lose, MD       sodium chloride flush (NS) 0.9 % injection 10 mL  10 mL Intracatheter PRN Nicholas Lose, MD   10 mL at 07/06/21 1428    PHYSICAL EXAMINATION: ECOG PERFORMANCE STATUS: {CHL ONC ECOG [phone]}  There were no vitals filed for this visit. There were no vitals filed for this visit.  BREAST:*** No palpable masses or nodules in either right or left breasts. No palpable axillary supraclavicular or infraclavicular adenopathy no breast tenderness or nipple discharge. (exam performed in the presence of a chaperone)  LABORATORY DATA:  I have reviewed the data as listed    Latest Ref Rng & Units 04/12/2022    8:20 AM 02/28/2022    9:32 AM 01/18/2022   10:36 AM  CMP  Glucose 70 - 99 mg/dL 96  100  100   BUN 6 - 20 mg/dL _0 Creatinine 0.44 - 1.00 mg/dL 0.70  0.73  0.75   Sodium 135 - 145 mmol/L 138  139  139   Potassium 3.5 - 5.1 mmol/L 4.1  4.1  4.2   Chloride 98 - 111 mmol/L 106  106  106   CO2 22 - 32 mmol/L _1 Calcium 8.9 - 10.3 mg/dL 9.4  10.0  9.9   Total Protein 6.5 - 8.1 g/dL 7.2  7.4  7.7   Total Bilirubin 0.3 - 1.2 mg/dL 0.2  0.2  0.2   Alkaline Phos 38 - 126 U/L 71  72  74   AST 15 - 41 U/L _2 ALT 0 - 44 U/L _3 Lab Results  Component Value Date   WBC 9.0 04/12/2022   HGB 12.4 04/12/2022   HCT 35.8 (L) 04/12/2022   MCV 93.0 04/12/2022   PLT 299 04/12/2022   NEUTROABS 5.4 04/12/2022    ASSESSMENT & PLAN:  No problem-specific Assessment & Plan notes found for this encounter.    No orders of the defined types were  placed in this encounter.  The patient has a good understanding of the overall plan. she agrees with it. she will call with any problems that may develop before the next visit here. Total time spent: 30 mins including face to face time and time spent for planning, charting and co-ordination of care   Suzzette Righter, [place] 05/02/22    I Gardiner Coins am scribing for Dr. Lindi Adie  ***

## 2022-05-04 ENCOUNTER — Inpatient Hospital Stay: Payer: 59 | Admitting: Hematology and Oncology

## 2022-05-04 ENCOUNTER — Inpatient Hospital Stay: Payer: 59

## 2022-05-04 DIAGNOSIS — C50212 Malignant neoplasm of upper-inner quadrant of left female breast: Secondary | ICD-10-CM

## 2022-05-04 DIAGNOSIS — Z171 Estrogen receptor negative status [ER-]: Secondary | ICD-10-CM

## 2022-05-04 DIAGNOSIS — Z5112 Encounter for antineoplastic immunotherapy: Secondary | ICD-10-CM | POA: Diagnosis not present

## 2022-05-04 MED ORDER — TRASTUZUMAB-ANNS CHEMO 150 MG IV SOLR
6.0000 mg/kg | Freq: Once | INTRAVENOUS | Status: AC
  Administered 2022-05-04: 294 mg via INTRAVENOUS
  Filled 2022-05-04: qty 14

## 2022-05-04 MED ORDER — DIPHENHYDRAMINE HCL 25 MG PO CAPS
25.0000 mg | ORAL_CAPSULE | Freq: Once | ORAL | Status: AC
  Administered 2022-05-04: 25 mg via ORAL
  Filled 2022-05-04: qty 1

## 2022-05-04 MED ORDER — SODIUM CHLORIDE 0.9 % IV SOLN: Freq: Once | INTRAVENOUS | Status: AC

## 2022-05-04 MED ORDER — ACETAMINOPHEN 325 MG PO TABS
650.0000 mg | ORAL_TABLET | Freq: Once | ORAL | Status: AC
  Administered 2022-05-04: 650 mg via ORAL
  Filled 2022-05-04: qty 2

## 2022-05-04 MED ORDER — HEPARIN SOD (PORK) LOCK FLUSH 100 UNIT/ML IV SOLN
500.0000 [IU] | Freq: Once | INTRAVENOUS | Status: AC | PRN
  Administered 2022-05-04: 500 [IU]

## 2022-05-04 MED ORDER — SODIUM CHLORIDE 0.9% FLUSH
10.0000 mL | INTRAVENOUS | Status: DC | PRN
  Administered 2022-05-04: 10 mL

## 2022-05-04 NOTE — Assessment & Plan Note (Signed)
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 Anti Her 2 therapy with Lapatinibstarted 09/17/2017-05/14/2019: Stopped for progression 3.I discontinuedtamoxifen and started her on letrozole 2.5 mg daily.05/14/2019 stopped for progression 4.Stereotactic radiosurgery 12/19/2017 to the new right parietal lobe metastases. 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 The spot on the liver noted on the CT scan did not have any activity on the PET scan. Postinflammatory  changes in the lungs chronic sequelae of atypical infection  Brain MRI 10/01/2021: Progression of disease.2new lesions, total of 9 enhancing brain metastases, SRS completed by Dr. Tammi Klippel  Based on this we decided to continue with Herceptin maintenance alone. The only other option would be Margetuximab.  Plan: Perform CT scans in December for monitoring metastatic breast cancer

## 2022-05-06 ENCOUNTER — Telehealth: Payer: Self-pay | Admitting: Hematology and Oncology

## 2022-05-17 ENCOUNTER — Other Ambulatory Visit: Payer: Self-pay | Admitting: Radiation Therapy

## 2022-05-17 DIAGNOSIS — C7931 Secondary malignant neoplasm of brain: Secondary | ICD-10-CM

## 2022-05-17 NOTE — Progress Notes (Signed)
Orders entered for port access the day of brain MRI at [facility].   Francena Zender R.T.(R)(T) Radiation Special Procedures Navigator  

## 2022-05-20 ENCOUNTER — Other Ambulatory Visit: Payer: Self-pay | Admitting: *Deleted

## 2022-05-20 DIAGNOSIS — C50212 Malignant neoplasm of upper-inner quadrant of left female breast: Secondary | ICD-10-CM

## 2022-05-22 ENCOUNTER — Other Ambulatory Visit: Payer: Self-pay | Admitting: Hematology and Oncology

## 2022-05-23 ENCOUNTER — Other Ambulatory Visit (HOSPITAL_COMMUNITY): Payer: Self-pay

## 2022-05-23 MED ORDER — HYDROCOD POLI-CHLORPHE POLI ER 10-8 MG/5ML PO SUER
5.0000 mL | Freq: Two times a day (BID) | ORAL | 0 refills | Status: DC
  Filled 2022-05-23: qty 115, 12d supply, fill #0

## 2022-05-24 ENCOUNTER — Ambulatory Visit: Payer: 59

## 2022-05-24 ENCOUNTER — Inpatient Hospital Stay: Payer: 59 | Attending: Medical

## 2022-05-24 ENCOUNTER — Inpatient Hospital Stay: Payer: 59

## 2022-05-24 ENCOUNTER — Encounter: Payer: Self-pay | Admitting: Hematology and Oncology

## 2022-05-24 ENCOUNTER — Other Ambulatory Visit (HOSPITAL_COMMUNITY): Payer: Self-pay

## 2022-05-24 ENCOUNTER — Ambulatory Visit: Payer: 59 | Admitting: Hematology and Oncology

## 2022-05-24 ENCOUNTER — Other Ambulatory Visit: Payer: Self-pay | Admitting: *Deleted

## 2022-05-24 VITALS — BP 114/69 | HR 80 | Temp 98.6°F | Resp 18 | Ht 62.0 in | Wt 105.8 lb

## 2022-05-24 DIAGNOSIS — C50212 Malignant neoplasm of upper-inner quadrant of left female breast: Secondary | ICD-10-CM | POA: Insufficient documentation

## 2022-05-24 DIAGNOSIS — Z5112 Encounter for antineoplastic immunotherapy: Secondary | ICD-10-CM | POA: Insufficient documentation

## 2022-05-24 DIAGNOSIS — C7951 Secondary malignant neoplasm of bone: Secondary | ICD-10-CM | POA: Diagnosis not present

## 2022-05-24 DIAGNOSIS — C7931 Secondary malignant neoplasm of brain: Secondary | ICD-10-CM | POA: Insufficient documentation

## 2022-05-24 DIAGNOSIS — Z452 Encounter for adjustment and management of vascular access device: Secondary | ICD-10-CM | POA: Insufficient documentation

## 2022-05-24 DIAGNOSIS — C787 Secondary malignant neoplasm of liver and intrahepatic bile duct: Secondary | ICD-10-CM | POA: Insufficient documentation

## 2022-05-24 DIAGNOSIS — Z95828 Presence of other vascular implants and grafts: Secondary | ICD-10-CM

## 2022-05-24 LAB — CBC WITH DIFFERENTIAL (CANCER CENTER ONLY)
Abs Immature Granulocytes: 0.03 10*3/uL (ref 0.00–0.07)
Basophils Absolute: 0.1 10*3/uL (ref 0.0–0.1)
Basophils Relative: 1 %
Eosinophils Absolute: 0.7 10*3/uL — ABNORMAL HIGH (ref 0.0–0.5)
Eosinophils Relative: 6 %
HCT: 35.7 % — ABNORMAL LOW (ref 36.0–46.0)
Hemoglobin: 12.1 g/dL (ref 12.0–15.0)
Immature Granulocytes: 0 %
Lymphocytes Relative: 14 %
Lymphs Abs: 1.6 10*3/uL (ref 0.7–4.0)
MCH: 32.1 pg (ref 26.0–34.0)
MCHC: 33.9 g/dL (ref 30.0–36.0)
MCV: 94.7 fL (ref 80.0–100.0)
Monocytes Absolute: 1.2 10*3/uL — ABNORMAL HIGH (ref 0.1–1.0)
Monocytes Relative: 10 %
Neutro Abs: 7.7 10*3/uL (ref 1.7–7.7)
Neutrophils Relative %: 69 %
Platelet Count: 406 10*3/uL — ABNORMAL HIGH (ref 150–400)
RBC: 3.77 MIL/uL — ABNORMAL LOW (ref 3.87–5.11)
RDW: 15.6 % — ABNORMAL HIGH (ref 11.5–15.5)
WBC Count: 11.3 10*3/uL — ABNORMAL HIGH (ref 4.0–10.5)
nRBC: 0 % (ref 0.0–0.2)

## 2022-05-24 LAB — CMP (CANCER CENTER ONLY)
ALT: 8 U/L (ref 0–44)
AST: 14 U/L — ABNORMAL LOW (ref 15–41)
Albumin: 4.1 g/dL (ref 3.5–5.0)
Alkaline Phosphatase: 74 U/L (ref 38–126)
Anion gap: 4 — ABNORMAL LOW (ref 5–15)
BUN: 11 mg/dL (ref 6–20)
CO2: 31 mmol/L (ref 22–32)
Calcium: 10.1 mg/dL (ref 8.9–10.3)
Chloride: 104 mmol/L (ref 98–111)
Creatinine: 0.82 mg/dL (ref 0.44–1.00)
GFR, Estimated: >60 mL/min (ref >60)
Glucose, Bld: 56 mg/dL — ABNORMAL LOW (ref 70–99)
Potassium: 4.5 mmol/L (ref 3.5–5.1)
Sodium: 139 mmol/L (ref 135–145)
Total Bilirubin: 0.2 mg/dL — ABNORMAL LOW (ref 0.3–1.2)
Total Protein: 8.2 g/dL — ABNORMAL HIGH (ref 6.5–8.1)

## 2022-05-24 LAB — MAGNESIUM: Magnesium: 2.1 mg/dL (ref 1.7–2.4)

## 2022-05-24 MED ORDER — DIPHENHYDRAMINE HCL 25 MG PO CAPS
25.0000 mg | ORAL_CAPSULE | Freq: Once | ORAL | Status: AC
  Administered 2022-05-24: 25 mg via ORAL
  Filled 2022-05-24: qty 1

## 2022-05-24 MED ORDER — ACETAMINOPHEN 325 MG PO TABS
650.0000 mg | ORAL_TABLET | Freq: Once | ORAL | Status: AC
  Administered 2022-05-24: 650 mg via ORAL
  Filled 2022-05-24: qty 2

## 2022-05-24 MED ORDER — SODIUM CHLORIDE 0.9% FLUSH
10.0000 mL | INTRAVENOUS | Status: DC | PRN
  Administered 2022-05-24: 10 mL

## 2022-05-24 MED ORDER — HEPARIN SOD (PORK) LOCK FLUSH 100 UNIT/ML IV SOLN
500.0000 [IU] | Freq: Once | INTRAVENOUS | Status: AC | PRN
  Administered 2022-05-24: 500 [IU]

## 2022-05-24 MED ORDER — TRASTUZUMAB-ANNS CHEMO 150 MG IV SOLR
6.0000 mg/kg | Freq: Once | INTRAVENOUS | Status: AC
  Administered 2022-05-24: 294 mg via INTRAVENOUS
  Filled 2022-05-24: qty 14

## 2022-05-24 MED ORDER — ALTEPLASE 2 MG IJ SOLR
2.0000 mg | Freq: Once | INTRAMUSCULAR | Status: AC | PRN
  Administered 2022-05-24: 2 mg
  Filled 2022-05-24: qty 2

## 2022-05-24 MED ORDER — SODIUM CHLORIDE 0.9 % IV SOLN: Freq: Once | INTRAVENOUS | Status: AC

## 2022-05-24 NOTE — Patient Instructions (Signed)
[facility]  Discharge Instructions: Thank you for choosing [facility] to provide your oncology and hematology care.   If you have a lab appointment with the [facility], please go directly to the [facility] and check in at the registration area.   Wear comfortable clothing and clothing appropriate for easy access to any Portacath or PICC line.   We strive to give you quality time with your provider. You may need to reschedule your appointment if you arrive late (15 or more minutes).  Arriving late affects you and other patients whose appointments are after yours.  Also, if you miss three or more appointments without notifying the office, you may be dismissed from the clinic at the provider's discretion.      For prescription refill requests, have your pharmacy contact our office and allow 72 hours for refills to be completed.    Today you received the following chemotherapy and/or immunotherapy agents: trastuzumab-anns      To help prevent nausea and vomiting after your treatment, we encourage you to take your nausea medication as directed.  BELOW ARE SYMPTOMS THAT SHOULD BE REPORTED IMMEDIATELY: *FEVER GREATER THAN 100.4 F (38 C) OR HIGHER *CHILLS OR SWEATING *NAUSEA AND VOMITING THAT IS NOT CONTROLLED WITH YOUR NAUSEA MEDICATION *UNUSUAL SHORTNESS OF BREATH *UNUSUAL BRUISING OR BLEEDING *URINARY PROBLEMS (pain or burning when urinating, or frequent urination) *BOWEL PROBLEMS (unusual diarrhea, constipation, pain near the anus) TENDERNESS IN MOUTH AND THROAT WITH OR WITHOUT PRESENCE OF ULCERS (sore throat, sores in mouth, or a toothache) UNUSUAL RASH, SWELLING OR PAIN  UNUSUAL VAGINAL DISCHARGE OR ITCHING   Items with * indicate a potential emergency and should be followed up as soon as possible or go to the Emergency Department if any problems should occur.  Please show the CHEMOTHERAPY ALERT CARD or IMMUNOTHERAPY ALERT CARD at  check-in to the Emergency Department and triage nurse.  Should you have questions after your visit or need to cancel or reschedule your appointment, please contact [facility]  Dept: [phone]  and follow the prompts.  Office hours are 8:00 a.m. to 4:30 p.m. Monday - Friday. Please note that voicemails left after 4:00 p.m. may not be returned until the following business day.  We are closed weekends and major holidays. You have access to a nurse at all times for urgent questions. Please call the main number to the clinic Dept: [phone] and follow the prompts.   For any non-urgent questions, you may also contact your provider using MyChart. We now offer e-Visits for anyone 52 and older to request care online for non-urgent symptoms. For details visit [email or web address].   Also download the MyChart app! Go to the app store, search "MyChart", open the app, select [facility], and log in with your MyChart username and password.  Masks are optional in the cancer centers. If you would like for your care team to wear a mask while they are taking care of you, please let them know. You may have one support person who is at least 54 years old accompany you for your appointments.

## 2022-05-24 NOTE — Progress Notes (Signed)
Per Dr. Lindi Adie ok to proceed with echo from 01/10/2022.

## 2022-05-26 ENCOUNTER — Encounter: Payer: Self-pay | Admitting: *Deleted

## 2022-05-26 NOTE — Progress Notes (Signed)
Received call from pt requesting dental clearance letter be faxed to [facility] to proceed with dental extractions. Per MD okay to proceed, letter faxed to number provided by pt [phone].

## 2022-06-09 ENCOUNTER — Telehealth: Payer: Self-pay | Admitting: *Deleted

## 2022-06-09 ENCOUNTER — Other Ambulatory Visit: Payer: Self-pay | Admitting: Adult Health

## 2022-06-09 DIAGNOSIS — C7931 Secondary malignant neoplasm of brain: Secondary | ICD-10-CM

## 2022-06-09 NOTE — Telephone Encounter (Signed)
RN placed call to pt regarding 13 hr prep with Prednisone 50 mg po as well as benadryl p.o.  Pt states she has medications at home and will start the prep prior to her scan on Monday.

## 2022-06-10 ENCOUNTER — Telehealth: Payer: Self-pay | Admitting: Hematology and Oncology

## 2022-06-10 NOTE — Telephone Encounter (Signed)
Rescheduled appointment per provider PAL. Dr.Gudena stated is he okay with patient proceeding to treatment without having an office FU appointment. Patient is aware of the changes made to her upcoming appointments.

## 2022-06-11 NOTE — Progress Notes (Signed)
Patient Care Team: Melissa Ferretti, MD as PCP - General (Internal Medicine) Melissa Ripple, MD as Referring Physician (Emergency Medicine)  DIAGNOSIS: No diagnosis found.  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 Ki-67 was 70% HER-2 was amplified with a ratio 2.91   07/12/2012 - 07/17/2013 Neo-Adjuvant Chemotherapy   TCH 6 followed by Herceptin maintenance   12/11/2012 Surgery   Left breast lumpectomy: 1.8 cm tumor 1 positive sentinel node, axillary lymph node dissection 02/08/2013 showed 0/13 lymph nodes   03/25/2013 - 05/06/2013 Radiation Therapy   Adjuvant radiation therapy   06/05/2013 - 07/20/2017 Anti-estrogen oral therapy   Tamoxifen 20 mg daily   07/27/2017 Relapse/Recurrence   MRI Brain: 3.4 x 2.9 x 2.9 cm RIGHT frontal lobe mass with imaging characteristics of solitary metastasis. Extensive vasogenic edema resulting in 9 mm RIGHT to LEFT midline shift. Equivocal very early LEFT ventricle entrapment.    08/04/2017 Surgery   Rt frontal brain resection: Poorly differentiated tumor IHC suggests breast primary ER and PR Positive   08/25/2017 - 09/04/2017 Radiation Therapy   Stereotactic radiation   09/18/2017 -  Anti-estrogen oral therapy   Lapatinib with letrozole   12/18/2017 - 12/19/2017 Radiation Therapy   New right parietal lobe metastases status post [facility]   08/27/2018 - 08/27/2020 Radiation Therapy   SRS to new brain metastases   05/09/2019 Relapse/Recurrence   Interval increase in size of the enhancing nodular left internal mammary soft tissue 2.8 cm.  Redemonstrated enlarged supraclavicular, lower cervical and lower posterior cervical nodes unchanged.  Interval increase in the bony erosion of the  posterior and lateral left third rib, increasing soft tissue lesion eroding the left sternal body 3.1 cm was 2.5 cm.  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[place], Brain Mets 2019  06/10/2019 Initial Diagnosis   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([place])   06/19/2019 - 05/05/2020 Chemotherapy   ado-trastuzumab emtansine (KADCYLA)     Port-A-Cath in place    CHIEF COMPLIANT: metastatic breast cancer/discuss scans  INTERVAL HISTORY: Melissa [name] is a 56 y.o. with the above-mentioned history of metastatic breast cancer currently on treatment with Xeloda and Herceptin. She presents to the clinic today for follow-up and discuss scans.   ALLERGIES:  is allergic to aspirin, protonix [pantoprazole], doxycycline, promethazine-codeine, and iodinated contrast media.  MEDICATIONS:  Current Outpatient Medications  Medication Sig Dispense Refill   acetaminophen (TYLENOL) 325 MG tablet Take 2 tablets (650 mg total) by mouth every 6 (six) hours as needed for mild pain (or Fever >/= 101). 12 tablet 0   albuterol (VENTOLIN HFA) 108 (90 Base) MCG/ACT inhaler Inhale 2 puffs into the lungs every 6 (six) hours as needed for wheezing or shortness of breath (cough). 18 g 5   chlorpheniramine-HYDROcodone (TUSSIONEX) 10-8 MG/5ML Take 5 mLs by mouth 2 times daily. 115 mL 0   cholecalciferol (VITAMIN D3) 25 MCG (1000 UNIT) tablet Take 1,000  Units by mouth daily.     Fluticasone-Salmeterol,sensor, (AIRDUO DIGIHALER) 113-14 MCG/ACT AEPB Inhale 1 puff into the lungs 2 (two) times daily. 1 each 4   lidocaine-prilocaine (EMLA) cream APPLY EXTERNALLY TO THE AFFECTED AREA 1 TIME 30 g 3   Multiple Vitamins-Minerals  (EMERGEN-C VITAMIN C PO) Take by mouth.     predniSONE (DELTASONE) 50 MG tablet TAKE 1 TABLET BY MOUTH 13 HOURS AND 7 HOURS AND 1 HOUR PRIOR TO CT SCAN 3 tablet 0   Vitamin E 200 units TABS Take by mouth.     voriconazole (VFEND) 200 MG tablet Take 1 tablet (200 mg total) by mouth 2 (two) times daily. 60 tablet 11   No current facility-administered medications for this visit.   Facility-Administered Medications Ordered in Other Visits  Medication Dose Route Frequency Provider Last Rate Last Admin   alteplase (CATHFLO ACTIVASE) injection 2 mg  2 mg Intracatheter Once PRN Melissa Lose, MD       sodium chloride flush (NS) 0.9 % injection 10 mL  10 mL Intracatheter PRN Melissa Lose, MD   10 mL at 07/06/21 1428    PHYSICAL EXAMINATION: ECOG PERFORMANCE STATUS: {CHL ONC ECOG PS:[phone]}  There were no vitals filed for this visit. There were no vitals filed for this visit.  BREAST:*** No palpable masses or nodules in either right or left breasts. No palpable axillary supraclavicular or infraclavicular adenopathy no breast tenderness or nipple discharge. (exam performed in the presence of a chaperone)  LABORATORY DATA:  I have reviewed the data as listed    Latest Ref Rng & Units 05/24/2022   10:31 AM 04/12/2022    8:20 AM 02/28/2022    9:32 AM  CMP  Glucose 70 - 99 mg/dL 56  96  100   BUN 6 - 20 mg/dL _0 Creatinine 0.44 - 1.00 mg/dL 0.82  0.70  0.73   Sodium 135 - 145 mmol/L 139  138  139   Potassium 3.5 - 5.1 mmol/L 4.5  4.1  4.1   Chloride 98 - 111 mmol/L 104  106  106   CO2 22 - 32 mmol/L _1 Calcium 8.9 - 10.3 mg/dL 10.1  9.4  10.0   Total Protein 6.5 - 8.1 g/dL 8.2  7.2  7.4   Total Bilirubin 0.3 - 1.2 mg/dL 0.2  0.2  0.2   Alkaline Phos 38 - 126 U/L 74  71  72   AST 15 - 41 U/L _2 ALT 0 - 44 U/L _3 Lab Results  Component Value Date   WBC 11.3 (H) 05/24/2022   HGB 12.1 05/24/2022   HCT 35.7 (L) 05/24/2022   MCV 94.7 05/24/2022    PLT 406 (H) 05/24/2022   NEUTROABS 7.7 05/24/2022    ASSESSMENT & PLAN:  No problem-specific Assessment & Plan notes found for this encounter.    No orders of the defined types were placed in this encounter.  The patient has a good understanding of the overall plan. she agrees with it. she will call with any problems that may develop before the next visit here. Total time spent: 30 mins including face to face time and time spent for planning, charting and co-ordination of care   Suzzette Righter, [place] 06/11/22    I Gardiner Coins am scribing for Dr. Lindi Adie  ***

## 2022-06-13 ENCOUNTER — Ambulatory Visit (HOSPITAL_COMMUNITY)
Admission: RE | Admit: 2022-06-13 | Discharge: 2022-06-13 | Disposition: A | Discharge status: Home / Self Care | Payer: 59 | Source: Ambulatory Visit | Attending: Hematology and Oncology | Admitting: Hematology and Oncology

## 2022-06-13 DIAGNOSIS — J439 Emphysema, unspecified: Secondary | ICD-10-CM | POA: Insufficient documentation

## 2022-06-13 DIAGNOSIS — R000 Tachycardia, unspecified: Secondary | ICD-10-CM | POA: Insufficient documentation

## 2022-06-13 DIAGNOSIS — Z171 Estrogen receptor negative status [ER-]: Secondary | ICD-10-CM | POA: Diagnosis not present

## 2022-06-13 DIAGNOSIS — R0600 Dyspnea, unspecified: Secondary | ICD-10-CM | POA: Insufficient documentation

## 2022-06-13 DIAGNOSIS — Z01818 Encounter for other preprocedural examination: Secondary | ICD-10-CM | POA: Insufficient documentation

## 2022-06-13 DIAGNOSIS — F17200 Nicotine dependence, unspecified, uncomplicated: Secondary | ICD-10-CM | POA: Diagnosis not present

## 2022-06-13 DIAGNOSIS — I427 Cardiomyopathy due to drug and external agent: Secondary | ICD-10-CM | POA: Insufficient documentation

## 2022-06-13 DIAGNOSIS — C50212 Malignant neoplasm of upper-inner quadrant of left female breast: Secondary | ICD-10-CM | POA: Diagnosis not present

## 2022-06-13 DIAGNOSIS — C787 Secondary malignant neoplasm of liver and intrahepatic bile duct: Secondary | ICD-10-CM | POA: Insufficient documentation

## 2022-06-13 DIAGNOSIS — I700 Atherosclerosis of aorta: Secondary | ICD-10-CM | POA: Insufficient documentation

## 2022-06-13 DIAGNOSIS — K429 Umbilical hernia without obstruction or gangrene: Secondary | ICD-10-CM | POA: Diagnosis not present

## 2022-06-13 DIAGNOSIS — Z0189 Encounter for other specified special examinations: Secondary | ICD-10-CM

## 2022-06-13 LAB — ECHOCARDIOGRAM COMPLETE
Area-P 1/2: 3.68 cm2
Calc EF: 60.9 %
S' Lateral: 2.6 cm
Single Plane A2C EF: 60.8 %
Single Plane A4C EF: 57.6 %

## 2022-06-13 MED ORDER — HEPARIN SOD (PORK) LOCK FLUSH 100 UNIT/ML IV SOLN
INTRAVENOUS | Status: AC
  Filled 2022-06-13: qty 5

## 2022-06-13 MED ORDER — HEPARIN SOD (PORK) LOCK FLUSH 100 UNIT/ML IV SOLN: 500.0000 [IU] | Freq: Once | INTRAVENOUS | Status: DC

## 2022-06-13 MED ORDER — SODIUM CHLORIDE (PF) 0.9 % IJ SOLN
INTRAMUSCULAR | Status: AC
  Filled 2022-06-13: qty 50

## 2022-06-13 MED ORDER — IOHEXOL 300 MG/ML  SOLN
100.0000 mL | Freq: Once | INTRAMUSCULAR | Status: AC | PRN
  Administered 2022-06-13: 100 mL via INTRAVENOUS

## 2022-06-13 NOTE — Progress Notes (Signed)
  Echocardiogram 2D Echocardiogram has been performed.  Melissa [name] 06/13/2022, 9:51 AM

## 2022-06-14 ENCOUNTER — Other Ambulatory Visit: Payer: Self-pay | Admitting: Hematology and Oncology

## 2022-06-14 ENCOUNTER — Inpatient Hospital Stay: Payer: 59

## 2022-06-14 ENCOUNTER — Inpatient Hospital Stay: Payer: 59 | Attending: Medical | Admitting: Hematology and Oncology

## 2022-06-14 VITALS — BP 116/61 | HR 74 | Temp 97.2°F | Resp 18 | Ht 62.0 in | Wt 106.6 lb

## 2022-06-14 DIAGNOSIS — C7931 Secondary malignant neoplasm of brain: Secondary | ICD-10-CM | POA: Insufficient documentation

## 2022-06-14 DIAGNOSIS — Z5112 Encounter for antineoplastic immunotherapy: Secondary | ICD-10-CM | POA: Insufficient documentation

## 2022-06-14 DIAGNOSIS — C50212 Malignant neoplasm of upper-inner quadrant of left female breast: Secondary | ICD-10-CM

## 2022-06-14 DIAGNOSIS — Z79899 Other long term (current) drug therapy: Secondary | ICD-10-CM | POA: Diagnosis not present

## 2022-06-14 DIAGNOSIS — Z171 Estrogen receptor negative status [ER-]: Secondary | ICD-10-CM | POA: Diagnosis not present

## 2022-06-14 DIAGNOSIS — C787 Secondary malignant neoplasm of liver and intrahepatic bile duct: Secondary | ICD-10-CM | POA: Diagnosis present

## 2022-06-14 DIAGNOSIS — C7951 Secondary malignant neoplasm of bone: Secondary | ICD-10-CM | POA: Insufficient documentation

## 2022-06-14 MED ORDER — ACETAMINOPHEN 325 MG PO TABS
650.0000 mg | ORAL_TABLET | Freq: Once | ORAL | Status: AC
  Administered 2022-06-14: 650 mg via ORAL
  Filled 2022-06-14: qty 2

## 2022-06-14 MED ORDER — TRASTUZUMAB-ANNS CHEMO 150 MG IV SOLR
6.0000 mg/kg | Freq: Once | INTRAVENOUS | Status: AC
  Administered 2022-06-14: 294 mg via INTRAVENOUS
  Filled 2022-06-14: qty 14

## 2022-06-14 MED ORDER — SODIUM CHLORIDE 0.9 % IV SOLN: Freq: Once | INTRAVENOUS | Status: AC

## 2022-06-14 MED ORDER — HEPARIN SOD (PORK) LOCK FLUSH 100 UNIT/ML IV SOLN
500.0000 [IU] | Freq: Once | INTRAVENOUS | Status: AC | PRN
  Administered 2022-06-14: 500 [IU]

## 2022-06-14 MED ORDER — DIPHENHYDRAMINE HCL 25 MG PO CAPS
25.0000 mg | ORAL_CAPSULE | Freq: Once | ORAL | Status: AC
  Administered 2022-06-14: 25 mg via ORAL
  Filled 2022-06-14: qty 1

## 2022-06-14 MED ORDER — SODIUM CHLORIDE 0.9% FLUSH
10.0000 mL | INTRAVENOUS | Status: DC | PRN
  Administered 2022-06-14: 10 mL

## 2022-06-14 MED ORDER — VENLAFAXINE HCL ER 37.5 MG PO CP24: 37.5000 mg | ORAL_CAPSULE | Freq: Every day | ORAL | 3 refills | Status: DC

## 2022-06-14 NOTE — Assessment & Plan Note (Signed)
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  [place] brain: 08/25/2017-09/04/2017 2. Anti Her 2 therapy with Lapatinib started 09/17/2017-05/14/2019: Stopped for progression 3.  I discontinued tamoxifen and started her on letrozole 2.5 mg daily.  05/14/2019 stopped for progression 4.  Stereotactic radiosurgery 12/19/2017 to the new right parietal lobe metastases. 5.  Kadcyla: Received 16 cycles discontinued 05/05/2020 6.  Tucatinib Xeloda: Discontinued because of hand-foot syndrome 06/11/2020-12/15/2020 7. Herceptin/Neratinib: unable to tolerate due to diarrhea and weight loss 8.  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  The spot on the liver noted on the CT scan did not have any activity on the PET scan.  Postinflammatory  changes in the lungs chronic sequelae of atypical infection   CT CAP 06/13/2022: Right hepatic lobe lesion decreased in size from 1.7 to 1.4 cm  Continue with current treatment plan

## 2022-06-15 ENCOUNTER — Other Ambulatory Visit: Payer: Self-pay | Admitting: Hematology and Oncology

## 2022-06-16 ENCOUNTER — Other Ambulatory Visit: Payer: Self-pay | Admitting: Hematology and Oncology

## 2022-06-16 ENCOUNTER — Other Ambulatory Visit (HOSPITAL_COMMUNITY): Payer: Self-pay

## 2022-06-16 MED ORDER — HYDROCOD POLI-CHLORPHE POLI ER 10-8 MG/5ML PO SUER
5.0000 mL | Freq: Two times a day (BID) | ORAL | 0 refills | Status: DC
  Filled 2022-06-16: qty 115, 12d supply, fill #0

## 2022-07-01 ENCOUNTER — Telehealth: Payer: Self-pay | Admitting: *Deleted

## 2022-07-01 NOTE — Telephone Encounter (Signed)
Received call from pt with complaint of ongoing headache since starting Effexor 37.5 mg capsules and requesting to stop at this time.  RN reviewed with MD and verbal orders received for pt to stop, no titration needed since pt has only been on the medication for 2 weeks. Pt educated and verbalized understanding.

## 2022-07-05 ENCOUNTER — Ambulatory Visit: Payer: 59 | Admitting: Hematology and Oncology

## 2022-07-05 ENCOUNTER — Ambulatory Visit: Payer: 59

## 2022-07-05 ENCOUNTER — Other Ambulatory Visit: Payer: Self-pay

## 2022-07-05 ENCOUNTER — Inpatient Hospital Stay: Payer: 59

## 2022-07-05 ENCOUNTER — Encounter: Payer: Self-pay | Admitting: Hematology and Oncology

## 2022-07-05 VITALS — BP 119/72 | HR 87 | Temp 97.8°F | Resp 18 | Wt 102.2 lb

## 2022-07-05 DIAGNOSIS — Z171 Estrogen receptor negative status [ER-]: Secondary | ICD-10-CM

## 2022-07-05 DIAGNOSIS — Z5112 Encounter for antineoplastic immunotherapy: Secondary | ICD-10-CM | POA: Diagnosis not present

## 2022-07-05 MED ORDER — DIPHENHYDRAMINE HCL 25 MG PO CAPS
25.0000 mg | ORAL_CAPSULE | Freq: Once | ORAL | Status: AC
  Administered 2022-07-05: 25 mg via ORAL
  Filled 2022-07-05: qty 1

## 2022-07-05 MED ORDER — HEPARIN SOD (PORK) LOCK FLUSH 100 UNIT/ML IV SOLN
500.0000 [IU] | Freq: Once | INTRAVENOUS | Status: AC | PRN
  Administered 2022-07-05: 500 [IU]

## 2022-07-05 MED ORDER — SODIUM CHLORIDE 0.9% FLUSH
10.0000 mL | INTRAVENOUS | Status: DC | PRN
  Administered 2022-07-05: 10 mL

## 2022-07-05 MED ORDER — SODIUM CHLORIDE 0.9 % IV SOLN: Freq: Once | INTRAVENOUS | Status: AC

## 2022-07-05 MED ORDER — ACETAMINOPHEN 325 MG PO TABS
650.0000 mg | ORAL_TABLET | Freq: Once | ORAL | Status: AC
  Administered 2022-07-05: 650 mg via ORAL
  Filled 2022-07-05: qty 2

## 2022-07-05 MED ORDER — TRASTUZUMAB-ANNS CHEMO 150 MG IV SOLR
6.0000 mg/kg | Freq: Once | INTRAVENOUS | Status: AC
  Administered 2022-07-05: 294 mg via INTRAVENOUS
  Filled 2022-07-05: qty 14

## 2022-07-05 NOTE — Patient Instructions (Signed)
[name] ONCOLOGY  Discharge Instructions: Thank you for choosing [facility] to provide your oncology and hematology care.   If you have a lab appointment with the [facility], please go directly to the [facility] and check in at the registration area.   Wear comfortable clothing and clothing appropriate for easy access to any Portacath or PICC line.   We strive to give you quality time with your provider. You may need to reschedule your appointment if you arrive late (15 or more minutes).  Arriving late affects you and other patients whose appointments are after yours.  Also, if you miss three or more appointments without notifying the office, you may be dismissed from the clinic at the provider's discretion.      For prescription refill requests, have your pharmacy contact our office and allow 72 hours for refills to be completed.    Today you received the following chemotherapy and/or immunotherapy agents: Kanjinti (Trastuzumab)      To help prevent nausea and vomiting after your treatment, we encourage you to take your nausea medication as directed.  BELOW ARE SYMPTOMS THAT SHOULD BE REPORTED IMMEDIATELY: *FEVER GREATER THAN 100.4 F (38 C) OR HIGHER *CHILLS OR SWEATING *NAUSEA AND VOMITING THAT IS NOT CONTROLLED WITH YOUR NAUSEA MEDICATION *UNUSUAL SHORTNESS OF BREATH *UNUSUAL BRUISING OR BLEEDING *URINARY PROBLEMS (pain or burning when urinating, or frequent urination) *BOWEL PROBLEMS (unusual diarrhea, constipation, pain near the anus) TENDERNESS IN MOUTH AND THROAT WITH OR WITHOUT PRESENCE OF ULCERS (sore throat, sores in mouth, or a toothache) UNUSUAL RASH, SWELLING OR PAIN  UNUSUAL VAGINAL DISCHARGE OR ITCHING   Items with * indicate a potential emergency and should be followed up as soon as possible or go to the Emergency Department if any problems should occur.  Please show the CHEMOTHERAPY ALERT CARD or IMMUNOTHERAPY ALERT CARD at  check-in to the Emergency Department and triage nurse.  Should you have questions after your visit or need to cancel or reschedule your appointment, please contact [facility]  Dept: [phone]  and follow the prompts.  Office hours are 8:00 a.m. to 4:30 p.m. Monday - Friday. Please note that voicemails left after 4:00 p.m. may not be returned until the following business day.  We are closed weekends and major holidays. You have access to a nurse at all times for urgent questions. Please call the main number to the clinic Dept: [phone] and follow the prompts.   For any non-urgent questions, you may also contact your provider using MyChart. We now offer e-Visits for anyone 14 and older to request care online for non-urgent symptoms. For details visit [email or web address].   Also download the MyChart app! Go to the app store, search "MyChart", open the app, select [facility], and log in with your MyChart username and password.  Masks are optional in the cancer centers. If you would like for your care team to wear a mask while they are taking care of you, please let them know. You may have one support person who is at least 54 years old accompany you for your appointments.

## 2022-07-10 ENCOUNTER — Other Ambulatory Visit: Payer: Self-pay | Admitting: Hematology and Oncology

## 2022-07-14 ENCOUNTER — Other Ambulatory Visit (HOSPITAL_COMMUNITY): Payer: Self-pay

## 2022-07-14 ENCOUNTER — Ambulatory Visit
Admission: RE | Admit: 2022-07-14 | Discharge: 2022-07-14 | Disposition: A | Discharge status: Home / Self Care | Payer: 59 | Source: Ambulatory Visit | Attending: Radiation Oncology | Admitting: Radiation Oncology

## 2022-07-14 DIAGNOSIS — C7931 Secondary malignant neoplasm of brain: Secondary | ICD-10-CM

## 2022-07-14 MED ORDER — HEPARIN SOD (PORK) LOCK FLUSH 100 UNIT/ML IV SOLN
500.0000 [IU] | Freq: Once | INTRAVENOUS | Status: AC
  Administered 2022-07-14: 500 [IU] via INTRAVENOUS

## 2022-07-14 MED ORDER — SODIUM CHLORIDE 0.9% FLUSH
10.0000 mL | INTRAVENOUS | Status: DC | PRN
  Administered 2022-07-14: 10 mL via INTRAVENOUS

## 2022-07-14 MED ORDER — GADOPICLENOL 0.5 MMOL/ML IV SOLN
5.0000 mL | Freq: Once | INTRAVENOUS | Status: AC | PRN
  Administered 2022-07-14: 5 mL via INTRAVENOUS

## 2022-07-14 MED ORDER — HYDROCOD POLI-CHLORPHE POLI ER 10-8 MG/5ML PO SUER
5.0000 mL | Freq: Two times a day (BID) | ORAL | 0 refills | Status: DC
  Filled 2022-07-14 – 2022-07-25 (×2): qty 115, 12d supply, fill #0

## 2022-07-15 ENCOUNTER — Other Ambulatory Visit: Payer: Self-pay

## 2022-07-15 ENCOUNTER — Other Ambulatory Visit (HOSPITAL_COMMUNITY): Payer: Self-pay

## 2022-07-18 ENCOUNTER — Other Ambulatory Visit (HOSPITAL_COMMUNITY): Payer: Self-pay

## 2022-07-18 ENCOUNTER — Inpatient Hospital Stay: Payer: 59 | Attending: Medical

## 2022-07-18 DIAGNOSIS — Z5112 Encounter for antineoplastic immunotherapy: Secondary | ICD-10-CM | POA: Insufficient documentation

## 2022-07-18 DIAGNOSIS — Z79899 Other long term (current) drug therapy: Secondary | ICD-10-CM | POA: Insufficient documentation

## 2022-07-18 DIAGNOSIS — C7931 Secondary malignant neoplasm of brain: Secondary | ICD-10-CM | POA: Insufficient documentation

## 2022-07-18 DIAGNOSIS — C50212 Malignant neoplasm of upper-inner quadrant of left female breast: Secondary | ICD-10-CM | POA: Insufficient documentation

## 2022-07-18 DIAGNOSIS — Z170 Estrogen receptor positive status [ER+]: Secondary | ICD-10-CM | POA: Insufficient documentation

## 2022-07-20 ENCOUNTER — Encounter (HOSPITAL_COMMUNITY): Payer: Self-pay

## 2022-07-20 ENCOUNTER — Ambulatory Visit: Payer: 59 | Admitting: Urology

## 2022-07-20 ENCOUNTER — Other Ambulatory Visit (HOSPITAL_COMMUNITY): Payer: Self-pay

## 2022-07-25 ENCOUNTER — Other Ambulatory Visit (HOSPITAL_COMMUNITY): Payer: Self-pay

## 2022-07-25 ENCOUNTER — Other Ambulatory Visit: Payer: Self-pay

## 2022-07-26 ENCOUNTER — Encounter: Payer: Self-pay | Admitting: Hematology and Oncology

## 2022-07-26 ENCOUNTER — Inpatient Hospital Stay (HOSPITAL_BASED_OUTPATIENT_CLINIC_OR_DEPARTMENT_OTHER): Payer: 59 | Admitting: Hematology and Oncology

## 2022-07-26 ENCOUNTER — Inpatient Hospital Stay: Payer: 59

## 2022-07-26 ENCOUNTER — Other Ambulatory Visit (HOSPITAL_COMMUNITY): Payer: Self-pay

## 2022-07-26 VITALS — BP 115/74 | HR 85 | Temp 99.7°F | Resp 16 | Wt 100.7 lb

## 2022-07-26 VITALS — BP 92/66 | HR 82 | Resp 18

## 2022-07-26 DIAGNOSIS — C7931 Secondary malignant neoplasm of brain: Secondary | ICD-10-CM | POA: Diagnosis not present

## 2022-07-26 DIAGNOSIS — Z171 Estrogen receptor negative status [ER-]: Secondary | ICD-10-CM

## 2022-07-26 DIAGNOSIS — C50212 Malignant neoplasm of upper-inner quadrant of left female breast: Secondary | ICD-10-CM

## 2022-07-26 DIAGNOSIS — C787 Secondary malignant neoplasm of liver and intrahepatic bile duct: Secondary | ICD-10-CM | POA: Diagnosis present

## 2022-07-26 DIAGNOSIS — Z170 Estrogen receptor positive status [ER+]: Secondary | ICD-10-CM | POA: Diagnosis not present

## 2022-07-26 DIAGNOSIS — Z5112 Encounter for antineoplastic immunotherapy: Secondary | ICD-10-CM | POA: Diagnosis present

## 2022-07-26 DIAGNOSIS — C50912 Malignant neoplasm of unspecified site of left female breast: Secondary | ICD-10-CM | POA: Diagnosis not present

## 2022-07-26 DIAGNOSIS — Z79899 Other long term (current) drug therapy: Secondary | ICD-10-CM | POA: Diagnosis not present

## 2022-07-26 MED ORDER — AZITHROMYCIN 250 MG PO TABS
ORAL_TABLET | ORAL | 0 refills | Status: AC
  Filled 2022-07-26 (×2): qty 6, 5d supply, fill #0

## 2022-07-26 MED ORDER — ACETAMINOPHEN 325 MG PO TABS
650.0000 mg | ORAL_TABLET | Freq: Once | ORAL | Status: AC
  Administered 2022-07-26: 650 mg via ORAL
  Filled 2022-07-26: qty 2

## 2022-07-26 MED ORDER — TRASTUZUMAB-ANNS CHEMO 150 MG IV SOLR
6.0000 mg/kg | Freq: Once | INTRAVENOUS | Status: AC
  Administered 2022-07-26: 300 mg via INTRAVENOUS
  Filled 2022-07-26: qty 14.29

## 2022-07-26 MED ORDER — DIPHENHYDRAMINE HCL 25 MG PO CAPS
25.0000 mg | ORAL_CAPSULE | Freq: Once | ORAL | Status: AC
  Administered 2022-07-26: 25 mg via ORAL
  Filled 2022-07-26: qty 1

## 2022-07-26 MED ORDER — HEPARIN SOD (PORK) LOCK FLUSH 100 UNIT/ML IV SOLN
500.0000 [IU] | Freq: Once | INTRAVENOUS | Status: AC | PRN
  Administered 2022-07-26: 500 [IU]

## 2022-07-26 MED ORDER — SODIUM CHLORIDE 0.9 % IV SOLN: Freq: Once | INTRAVENOUS | Status: AC

## 2022-07-26 MED ORDER — SODIUM CHLORIDE 0.9% FLUSH
10.0000 mL | INTRAVENOUS | Status: DC | PRN
  Administered 2022-07-26: 10 mL

## 2022-07-26 NOTE — Assessment & Plan Note (Signed)
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  [place] brain: 08/25/2017-09/04/2017 2. Anti Her 2 therapy with Lapatinib started 09/17/2017-05/14/2019: Stopped for progression 3.  I discontinued tamoxifen and started her on letrozole 2.5 mg daily.  05/14/2019 stopped for progression 4.  Stereotactic radiosurgery 12/19/2017 to the new right parietal lobe metastases. 5.  Kadcyla: Received 16 cycles discontinued 05/05/2020 6.  Tucatinib Xeloda: Discontinued because of hand-foot syndrome 06/11/2020-12/15/2020 7. Herceptin/Neratinib: unable to tolerate due to diarrhea and weight loss 8.  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  She and her husband are  getting therapy.  Brain MRI 07/14/2022: Enhancing lesions within the anterior right temporal lobe, left parietal to occipital lobes, superior cerebellar vermis and within the pons have increased in size

## 2022-07-26 NOTE — Progress Notes (Signed)
Patient Care Team: Charlane Ferretti, MD as PCP - General (Internal Medicine) [name], MD as Referring Physician (Emergency Medicine)  DIAGNOSIS:  Encounter Diagnoses  Name Primary?   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 Ki-67 was 70% HER-2 was amplified with a ratio 2.91   07/12/2012 - 07/17/2013 Neo-Adjuvant Chemotherapy   TCH 6 followed by Herceptin maintenance   12/11/2012 Surgery   Left breast lumpectomy: 1.8 cm tumor 1 positive sentinel node, axillary lymph node dissection 02/08/2013 showed 0/13 lymph nodes   03/25/2013 - 05/06/2013 Radiation Therapy   Adjuvant radiation therapy   06/05/2013 - 07/20/2017 Anti-estrogen oral therapy   Tamoxifen 20 mg daily   07/27/2017 Relapse/Recurrence   MRI Brain: 3.4 x 2.9 x 2.9 cm RIGHT frontal lobe mass with imaging characteristics of solitary metastasis. Extensive vasogenic edema resulting in 9 mm RIGHT to LEFT midline shift. Equivocal very early LEFT ventricle entrapment.    08/04/2017 Surgery   Rt frontal brain resection: Poorly differentiated tumor IHC suggests breast primary ER and PR Positive   08/25/2017 - 09/04/2017 Radiation Therapy   Stereotactic radiation   09/18/2017 -  Anti-estrogen oral therapy   Lapatinib with letrozole   12/18/2017 - 12/19/2017 Radiation Therapy   New right parietal lobe metastases status post [facility]   08/27/2018 - 08/27/2020 Radiation Therapy   SRS to new brain metastases   05/09/2019 Relapse/Recurrence    Interval increase in size of the enhancing nodular left internal mammary soft tissue 2.8 cm.  Redemonstrated enlarged supraclavicular, lower cervical and lower posterior cervical nodes unchanged.  Interval increase in the bony erosion of the posterior and lateral left third rib, increasing soft tissue lesion eroding the left sternal body 3.1 cm was 2.5 cm.  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 with the above-mentioned history of metastatic breast cancer currently on treatment with Xeloda and Herceptin. She presents to the clinic today for follow-up and discuss scans. She reports that she just been having a lot of sinus issues over the last 2 weeks. She says the cough has got worst due to sinus.   ALLERGIES:  is allergic to aspirin, protonix [pantoprazole], doxycycline, promethazine-codeine, and iodinated contrast media.  MEDICATIONS:  Current Outpatient Medications  Medication Sig Dispense Refill   acetaminophen (TYLENOL) 325 MG tablet Take 2 tablets (650 mg total) by mouth every 6 (six) hours as  needed for mild pain (or Fever >/= 101). 12 tablet 0   albuterol (VENTOLIN HFA) 108 (90 Base) MCG/ACT inhaler Inhale 2 puffs into the lungs every 6 (six) hours as needed for wheezing or shortness of breath (cough). 18 g 5   azithromycin (ZITHROMAX Z-PAK) 250 MG tablet As directed 6 each 0    chlorpheniramine-HYDROcodone (TUSSIONEX) 10-8 MG/5ML Take 5 mLs by mouth 2 times daily. 115 mL 0   cholecalciferol (VITAMIN D3) 25 MCG (1000 UNIT) tablet Take 1,000 Units by mouth daily.     Fluticasone-Salmeterol,sensor, (AIRDUO DIGIHALER) 113-14 MCG/ACT AEPB Inhale 1 puff into the lungs 2 (two) times daily. 1 each 4   lidocaine-prilocaine (EMLA) cream APPLY EXTERNALLY TO THE AFFECTED AREA 1 TIME 30 g 3   Multiple Vitamins-Minerals (EMERGEN-C VITAMIN C PO) Take by mouth.     predniSONE (DELTASONE) 50 MG tablet TAKE 1 TABLET BY MOUTH 13 HOURS AND 7 HOURS AND 1 HOUR PRIOR TO CT SCAN 3 tablet 0   SYMBICORT 160-4.5 MCG/ACT inhaler Inhale 2 puffs into the lungs 2 (two) times daily.     venlafaxine XR (EFFEXOR-XR) 37.5 MG 24 hr capsule Take 37.5 mg by mouth daily.     Vitamin E 200 units TABS Take by mouth.     voriconazole (VFEND) 200 MG tablet Take 1 tablet (200 mg total) by mouth 2 (two) times daily. 60 tablet 11   No current facility-administered medications for this visit.   Facility-Administered Medications Ordered in Other Visits  Medication Dose Route Frequency Provider Last Rate Last Admin   alteplase (CATHFLO ACTIVASE) injection 2 mg  2 mg Intracatheter Once PRN Nicholas Lose, MD       sodium chloride flush (NS) 0.9 % injection 10 mL  10 mL Intracatheter PRN Nicholas Lose, MD   10 mL at 07/06/21 1428    PHYSICAL EXAMINATION: ECOG PERFORMANCE STATUS: 1 - Symptomatic but completely ambulatory  Vitals:   07/26/22 0822  BP: 115/74  Pulse: 85  Resp: 16  Temp: 99.7 F (37.6 C)  SpO2: 100%   Filed Weights   07/26/22 0822  Weight: 100 lb 11.2 oz (45.7 kg)    LABORATORY DATA:  I have reviewed the data as listed    Latest Ref Rng & Units 05/24/2022   10:31 AM 04/12/2022    8:20 AM 02/28/2022    9:32 AM  CMP  Glucose 70 - 99 mg/dL 56  96  100   BUN 6 - 20 mg/dL '11  13  12   '$ Creatinine 0.44 - 1.00 mg/dL 0.82  0.70  0.73   Sodium 135 - 145 mmol/L 139  138  139   Potassium 3.5 -  5.1 mmol/L 4.5  4.1  4.1   Chloride 98 - 111 mmol/L 104  106  106   CO2 22 - 32 mmol/L '31  29  29   '$ Calcium 8.9 - 10.3 mg/dL 10.1  9.4  10.0   Total Protein 6.5 - 8.1 g/dL 8.2  7.2  7.4   Total Bilirubin 0.3 - 1.2 mg/dL 0.2  0.2  0.2   Alkaline Phos 38 - 126 U/L 74  71  72   AST 15 - 41 U/L '14  16  19   '$ ALT 0 - 44 U/L '8  13  13     '$ Lab Results  Component Value Date   WBC 11.3 (H) 05/24/2022   HGB 12.1 05/24/2022   HCT 35.7 (L) 05/24/2022   MCV 94.7 05/24/2022  PLT 406 (H) 05/24/2022   NEUTROABS 7.7 05/24/2022    ASSESSMENT & PLAN:  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  [place] brain: 08/25/2017-09/04/2017 2. Anti Her 2 therapy with Lapatinib started 09/17/2017-05/14/2019: Stopped for progression 3.  I discontinued tamoxifen and started her on letrozole 2.5 mg daily.  05/14/2019 stopped for progression 4.  Stereotactic radiosurgery 12/19/2017 to the new right parietal lobe metastases. 5.  Kadcyla: Received 16 cycles discontinued 05/05/2020 6.  Tucatinib Xeloda: Discontinued because of hand-foot syndrome 06/11/2020-12/15/2020 7. Herceptin/Neratinib: unable to tolerate due to diarrhea and weight loss 8.  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  She and her husband are getting therapy.  Brain MRI 07/14/2022: Enhancing lesions within the anterior right temporal lobe, left parietal to occipital lobes, superior cerebellar vermis and within the pons have increased in size  Continue with every 3-week Herceptin next scans will be done in June. I will see the patient every 6 weeks for follow-ups.   No orders of the defined types were placed in this encounter.  The patient has a good understanding of the overall plan. she agrees with it. she will call with any problems that may develop before the next visit here. Total time spent: 30 mins including face to face time and time spent for planning, charting and co-ordination of care   Harriette Ohara, MD 07/26/22    I Gardiner Coins am acting as a Education administrator for [employer]  I have reviewed the above documentation for accuracy and completeness, and I agree with the above.

## 2022-07-26 NOTE — Patient Instructions (Signed)
[facility] ONCOLOGY  Discharge Instructions: Thank you for choosing [facility] to provide your oncology and hematology care.   If you have a lab appointment with the [facility], please go directly to the [facility] and check in at the registration area.   Wear comfortable clothing and clothing appropriate for easy access to any Portacath or PICC line.   We strive to give you quality time with your provider. You may need to reschedule your appointment if you arrive late (15 or more minutes).  Arriving late affects you and other patients whose appointments are after yours.  Also, if you miss three or more appointments without notifying the office, you may be dismissed from the clinic at the provider's discretion.      For prescription refill requests, have your pharmacy contact our office and allow 72 hours for refills to be completed.    Today you received the following chemotherapy and/or immunotherapy agents: Kanjinti (Trastuzumab)      To help prevent nausea and vomiting after your treatment, we encourage you to take your nausea medication as directed.  BELOW ARE SYMPTOMS THAT SHOULD BE REPORTED IMMEDIATELY: *FEVER GREATER THAN 100.4 F (38 C) OR HIGHER *CHILLS OR SWEATING *NAUSEA AND VOMITING THAT IS NOT CONTROLLED WITH YOUR NAUSEA MEDICATION *UNUSUAL SHORTNESS OF BREATH *UNUSUAL BRUISING OR BLEEDING *URINARY PROBLEMS (pain or burning when urinating, or frequent urination) *BOWEL PROBLEMS (unusual diarrhea, constipation, pain near the anus) TENDERNESS IN MOUTH AND THROAT WITH OR WITHOUT PRESENCE OF ULCERS (sore throat, sores in mouth, or a toothache) UNUSUAL RASH, SWELLING OR PAIN  UNUSUAL VAGINAL DISCHARGE OR ITCHING   Items with * indicate a potential emergency and should be followed up as soon as possible or go to the Emergency Department if any problems should occur.  Please show the CHEMOTHERAPY ALERT CARD or IMMUNOTHERAPY ALERT CARD at  check-in to the Emergency Department and triage nurse.  Should you have questions after your visit or need to cancel or reschedule your appointment, please contact [facility]  Dept: [phone]  and follow the prompts.  Office hours are 8:00 a.m. to 4:30 p.m. Monday - Friday. Please note that voicemails left after 4:00 p.m. may not be returned until the following business day.  We are closed weekends and major holidays. You have access to a nurse at all times for urgent questions. Please call the main number to the clinic Dept: [phone] and follow the prompts.   For any non-urgent questions, you may also contact your provider using MyChart. We now offer e-Visits for anyone 93 and older to request care online for non-urgent symptoms. For details visit [email or web address].   Also download the MyChart app! Go to the app store, search "MyChart", open the app, select [facility], and log in with your MyChart username and password.  Masks are optional in the cancer centers. If you would like for your care team to wear a mask while they are taking care of you, please let them know. You may have one support person who is at least 55 years old accompany you for your appointments.

## 2022-07-27 ENCOUNTER — Encounter: Payer: Self-pay | Admitting: Urology

## 2022-07-27 ENCOUNTER — Telehealth: Payer: Self-pay | Admitting: Radiation Therapy

## 2022-07-27 ENCOUNTER — Other Ambulatory Visit: Payer: Self-pay | Admitting: Radiation Therapy

## 2022-07-27 ENCOUNTER — Ambulatory Visit
Admission: RE | Admit: 2022-07-27 | Discharge: 2022-07-27 | Disposition: A | Discharge status: Home / Self Care | Payer: 59 | Source: Ambulatory Visit | Attending: Urology | Admitting: Urology

## 2022-07-27 DIAGNOSIS — C7931 Secondary malignant neoplasm of brain: Secondary | ICD-10-CM

## 2022-07-27 DIAGNOSIS — C50912 Malignant neoplasm of unspecified site of left female breast: Secondary | ICD-10-CM

## 2022-07-27 MED ORDER — DEXAMETHASONE 2 MG PO TABS: 2.0000 mg | ORAL_TABLET | Freq: Two times a day (BID) | ORAL | 0 refills | Status: DC

## 2022-07-27 MED ORDER — PENTOXIFYLLINE ER 400 MG PO TBCR: 400.0000 mg | EXTENDED_RELEASE_TABLET | Freq: Three times a day (TID) | ORAL | 1 refills | Status: DC

## 2022-07-27 NOTE — Progress Notes (Addendum)
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       ICD-10-CM   1. 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/Initial Ca 1997, Brain Mets 2019  C50.912    C79.31     2. 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 [facility])  C79.31       Interval Since Last Radiation:  9 months 10/12/21: The 3 mm target in the right pons was treated to a prescription dose of 20 G in a single fraction.  06/29/21: These two targets were treated to a prescription dose of 20 G in a single fraction.   03/17/21: These three targets were treated to a prescription dose of 20 G in a single fraction.   08/27/20: These five targets were treated to a prescription dose of 20 G in a single fraction.    05/13/20:  SRS brain to the following 4 targets treated to a prescription dose of 20 Gy in a single fraction with a single isocenter:  PTV6 Mid Cerebellum 39m PTV7 Lt Occipital 477mPTV8 Lt Occipital 32m432mTV9 Lt Temporal 32mm67m9/14/2020:  SRS brain//PTV 5:  Right frontal 5 mm target was treated to a prescription dose of 20 Gy in a single fraction.   09/04/2018:   SRS Brain// Right Frontal, 2 targets / 20 Gy in 1 fraction PTV3: Ant Rt Frontal 11mm81my PTV4: Rt Frontal resection cavity 5mm  84my  12/18/2017: SRS brain//PTV2: 4 mm Rt Parietal lesion treated to 20 Gy in 1 Fx  08/25/2017, 08/28/2017, 08/30/2017, 09/01/2017, 09/04/2017: PTV1: post op SRS to right frontal lobe resection cavity in 5 fxs  Narrative:  In summary, she initially presented to the emergency Department on 07/27/2017 with complaints of headaches ongoing for approximately 5 weeks with associated nausea and vomiting. She was initially treated by her PCP for  sinusitis without improvement.  She also has a history of migraine headaches and had ben evaluated in the ED at [facility]05/2017 for treatment of what she thought was a typical migraine headache given the fact that she had her usual blurry vision and aura preceding the headache. She reports that the headache did improve with treatment but the relief was short-lived as the headache returned the very next day. She followed up with her primary care physician and a CT of the head was going to be scheduled due to the persistent headaches but in the interim, [facility] the headache with associated nausea and vomiting. The headaches were occuring in the frontal lobe as well as bilateral temporal with occasional radiation to the occipital lobe and associated with blurry vision, decreased appetite, fatigue, nausea and vomiting.  She denied any difficulty with speech, memory, imbalance or focal weakness.   CT Head on admission 07/27/2017 showed a large right frontal lobe mass measuring 3.1 by 2.5 cm with significant surrounding edema with mass effect and right to left midline shift measuring 8 mm. A subsequent Brain MRI showed a 3.4 x 2.9 x 2.9 cm right frontal lobe mass with imaging characteristics of solitary metastasis and extensive vasogenic edema resulting in 9  mm right to left midline shift as well as equivocal very early left ventricle entrapment.     CT Chest, Abdomen, Pelvis on 07/28/2017 for disease staging showed no findings to suggest metastatic breast cancer involving the chest, abdomen, pelvis or osseous structures. 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 Surgical changes involving the left breast and left axilla but no findings for local recurrence or regional adenopathy.  She proceeded with a gross total resection of the frontal lesion on 08/05/17 with Dr. Cyndy Freeze  followed by adjuvant fractionated postop SRS radiotherapy to the resection cavity in February 2019. Final surgical pathology revealed poorly differentiated adenocarcinoma consistent with metastatic breast cancer, ER/PR positive and HER-2 positive.   She tolerated SRS treatment well and initial post treatment MRI brain on 12/07/17 showed satisfactory appearance of the right anterior frontal lobe post treatment site with expected evolution but unfortunately also showed a single, new 2 - 3 mm lesion in the right parietal lobe without associated edema or mass-effect.  She elected to undergo salvage SRS treatment to the new lesion in the parietal lobe which was completed on 12/18/17 and tolerated well.  A follow up brain MRI on 05/18/18 showed a stable right frontal resection cavity with stable 4 mm nodular enhancement at the inferior cavity margin but no evidence of new disease.  Unfortunately, the follow up MRI brain scan on 08/23/2018 showed a new 11 mm inferior right frontal lesion with only mild associated edema and no mass-effect.  She reported that she had been having more frequent frontal headaches which were not severe and responded to Aleve and otherwise, had been feeling well.  She elected to proceed with a single fraction of SRS to this new brain metastasis as recommended and this was completed on  09/04/18 and tolerated very well.   She had bronchoscopy with Dr. Valeta Harms in 05/2018 and dx'ed with Aspergillus- ID following (Dr. Graylon Good).  She also continues in routine follow up with Dr. Melvyn Novas in Pulmonology.  Follow up MRI brain scan from 03/08/19 showed continued interval enlargement of the nodular focus with enhancement at the superior margin of the right frontal resection cavity, measuring 5 x 6 mm in diameter compared with 3 x 4.5 mm on the previous study. Two smaller foci of enhancement along the inferior margin were unchanged and there was no evidence of increasing mass effect or edema and no new lesions seen  elsewhere within the brain.  She elected to proceed with SRS treatment of the progressive nodular lesion which was completed on 03/25/19 and tolerated well without any acute ill effects.    She developed systemic disease progression, particularly in the chest with increase in size of enhancing nodular left internal mammary soft tissue mass, lymph nodes and left third rib lesion and soft tissue lesion eroding the left sternal body as well as a new, hypermetabolic 6.5 cm liver lesion seen on PET scan from 05/23/2019 despite continuing on lapatinib with Letrozole. She did have an ultrasound-guided biopsy of the liver mass on 06/05/2019 which confirmed metastatic breast cancer, HER-2 positive, ER/PR negative. Her systemic therapy was changed to [place] beginning on 06/19/2019, under the care and direction of Dr. Lindi Adie which she tolerated very well.  A follow-up CT chest from 07/19/2019 showed a positive response to treatment with decrease in size of the soft tissue mass in the breast as well as bony lesions and adenopathy.  Fortunately, her follow-up MRI brain from 08/02/2019 showed stability of the  previously treated disease and no new or progressive findings. A repeat MRI brain scan 11/04/19 was felt to be overall stable but there was a slight change in a previously treated right inferior frontal lesion, measuring 5 mm, previously 4 mm, with a new, separate punctate focus of enhancement just posterior to that. This was such minimal change and when her prior treatment fields were fused with recent scan, it appeared that the new area of enhancement was on the field border of GTV3 and would have received a large portion of the delivered dose.  Therefore, consensus recommendation was to simply monitor this area with routine 3 month follow up brain imaging. She had repeat MRI on 01/30/20 and this showed overall disease stability with an unchanged appearance of the 2 previously treated right frontal lobe deposits and no new or  progressive disease. Repeat systemic disease staging scans were performed on 01/29/20 and 02/04/20.  The bone scan on 01/29/20 showed less uptake of tracer at the previously identified sternal and LEFT third rib lesions, consistent with response to therapy and there were no new sites of osseous metastasis noted. The CT C/A/P performed on 02/04/20 also showed disease stability with a new cluster of spiculated nodule or opacities in the posterior left lung favoring aspergillosis or other atypical infectious etiology.  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  There was no definite evidence of metastatic disease within the abdomen or pelvis. She continued on Kadcycla, s/p 14 cycles as of 05/05/20.   Repeat MRI brain from 05/01/20 showed 4 new metastatic lesions, all subcentimeter with a 66m midline cerebellar vermian metastasis, 459mleft occipital metastasis, 13m71might occipital metastasis and a punctate lesion in the left temporal lobe.  The concensus recommendation was to proceed with a single fraction of SRS to the 4 new metastases which she was in agreement with and was completed on 05/13/21.  Her restaging systemic imaging from 05/28/20 was without evidence of any disease progression or recurrence in the chest, abdomen or pelvis.  At the time of her follow up with Dr. GudLindi Adie 05/05/2020, the decision was made to discontinue the Kadcycla, due to disease progression in the brain, and switch to Tucatinib, oral systemic therapy in combination with Herceptin and Xeloda which was started 06/11/20.  Her follow up brain MRI from 08/13/20 unfortunately showed disease progression with 5 new lesions: with a 2 mm left frontal lesion, 2 mm right frontal lesion, 2 mm right occipital lesion, 2 mm right temporal lesion and a 5 mm left temporal lesion.  The 1 mm right occipital  lesion was initially thought to be new but on further review of prior treatment plans, it was noted to have been treated previously and stable.  Otherwise, the other previously treated lesions were improved and/or stable.  The consensus recommendation at brain conference was to proceed with a single fraction of SRS to the 5 new brain lesions which she was in agreement with and was completed on 08/27/20.  She tolerated the treatment very well without any ill side effects aside from some low grade nausea for the first 48 hours.   In May 2022, her surveillance MRI showed no new lesions.  However, her follow up brain MRI on 03/05/21 showed 2 new brain mets and a 3rd enlarging brain met which has not previously been treated. She was on hold  for Tucatinib due to cutaneous reactions but fortunately, her restaging systemic imaging with CT C/A/P from 02/26/21 was without any evidence of new or progressive disease in the chest and no evidence of metastatic disease involving the abdomen or pelvis. We met with her on 03/11/21 to review these findings and discuss the consensus recommendation from brain conference to proceed with a single fraction of SRS to the three new brain lesions which she was in agreement with and was completed on 03/17/21. A follow up MRI brain on 06/17/21 showed 2 new lesions in the left occipital measuring 5 mm and 4 mm.  Her imaging was reviewed in the multidisciplinary brain conference and again the consensus recommendation was to proceed with a single fraction of SRS to treat the 2 new lesions.  We met with her on 06/23/2021 she was in agreement to proceed and the treatment was completed on 06/29/2021. She tolerated the [facility] treatment well and had been off systemic therapy for a month in 06/2021 due to intolerance of GI side effects with neratinib (Nerlynx) in conjunction with herceptin.  Her disease restaging CT C/A/P performed on 07/22/2021 showed a retracted appearing, hypodense lesion adjacent to the  gallbladder fossa, hepatic segment V measuring 1.7 x 1.6 cm but no other evidence of new metastatic disease in the chest, abdomen or pelvis.  Fortunately, the PET scan on 08/13/2021 for further evaluation of the new lesion showed no specific findings to suggest recurrent FDG avid tumor or metastatic disease to the chest, abdomen or pelvis and specifically, no abnormal increased radiotracer uptake within the liver to correspond to the treated liver lesion adjacent to the gallbladder fossa.  At the time of her follow-up with Dr. Lindi Adie on 08/16/2021 the recommendation was to continue with Herceptin maintenance alone, as the only other option would be Margetuximab. Her surveillance MRI brain from 09/29/21 was reviewed in our brain tumor conference on 10/04/21 along with review of her previous brain MRIs.  We also fused her current brain MRI with contrast with a composite radiation distribution representing all of her previous 8 courses of stereotactic radiosurgery.  Then, we reviewed the 5 lesions of concern on the recent MRI with 2 being described as new and 3 being described as larger.  Ultimately, it only appeared that one 3 mm enhancing lesion on the right side of the pons with surrounding edema represented a true new brain metastasis.  The punctate lesion in the cerebellar vermis was previously treated with stereotactic radiosurgery and confirmed that the 3 larger lesions had all been treated with SRS within the past 2 years suggesting that the enlargement may represent an adverse radiation effect such as radiation necrosis more likely than local recurrence. Therefore, the consensus recommendation was to proceed with a single fraction of salvage stereotactic radiosurgery [facility]) to the right pontine brain metastasis, especially given its critical location in the brain, followed by continued MRI surveillance of the other treated and untreated portions of the brain.    We met with her on 10/07/21 to discuss  recommendations and she was in agreement to proceed. Her single fraction SRS was completed 10/12/21 and tolerated very well as per usual. Her follow up MRI brain scan from 01/06/22 showed an interval increase in size of 3 lesions, including the most recently treated lesion in the right pons, but no new lesions. This scan was fused with her prior treatment plans and reviewed in multidisciplinary conference on 01/17/22, confirming that the lesions had been previously treated. Consensus recommendation was  to continue with monitoring and she had a repeat MRI brain scan on 04/13/22. This scan demonstrated several areas with increased contrast enhancement and some with interval increase in size as compared to her prior study.  There was also a new contrast-enhancing lesion in the left inferior mastoid air cells on the left which could just be inflammatory.  This study was fused with her prior treatment plans and reviewed at the multidisciplinary brain conference on 04/18/2022 with consensus recommendation to continue monitoring with a repeat MRI brain scan in 3 months.  Interval History: She has recovered well from the effects of her previous SRS treatment and remains without complaints.  Her most recent MRI brain scan from 07/14/22 demonstrated an interval increase in size of the lesions within the anterior right temporal lobe, left parietooccipital lobe, superior cerebellar vermis and within the pons.  Edema surrounding the lesions within the right temporal lobe and left parietooccipital lobes has progressed. Mass effect related to the right temporal lobe lesion has also progressed, now with 4 mm leftward midline shift. Additionally, the medial right temporal lobe edema now exerts some mass effect upon the right cerebral peduncle. The remaining lesions within the supratentorial and infratentorial brain were not significantly changed and no new intracranial metastasis were identified. This study was fused with her prior  treatment plans and reviewed at the multidisciplinary brain conference on 07/18/2022 and a fusion of her prior treatment plans was performed and reviewed.   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 She has been having "sinus" headaches in the frontal, temporal and occipital regions, daily for at least the past month and has been taking pseudophedrine twice a day to help with the pressure but feels like she has gotten minimal relief with that. Dr. Lindi Adie did give her a Z-pak that she started yesterday and will complete tomorrow.  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 I also recommended she meet with Dr. Mickeal Skinner and she is in favor of this. We reviewed the imaging findings and recommendations today by telephone.    On review of systems, the patient states that she is doing well in general although she has been having daily headaches in the frontal, temporal and occipital regions. She thought this was sinus pressure so she has been taking pseudophedrine daily with minimally benefit.  She did start a Z-pak on 07/26/22 and will complete this on 07/28/22. She has had some runny nose and occasionally, bloody discharge but denies thick, green mucous or facial pressure. She also denies sneezing, fever, chills or body aches. She continues with some fatigue and chronic cough that has remained unchanged recently but she does feel like she has had more mood swings recently with anger/depression. She and her husband have started counseling to help with this.  She denies decreased visual or auditory acuity, tinnitus, tremor, focal weakness or seizure activity.  She does have occasional blurry vision associated with her voriconazole treatment and this has not been progressive or changed recently.   She is not having difficulty with speech or word finding. She denies abdominal pain, N/V or diarrhea. She reports a decent appetite and has been able to maintain her weight.  She continues in routine follow-up under the care and direction of Dr. Lindi Adie for continued management of her systemic disease  and is currently on herceptin maintenance only and is tolerating this well.  Her most recent CT C/A/P for disease restaging on 06/13/22 was without any findings to suggest recurrent or metastatic disease to the chest, abdomen or pelvis so she has continued on Herceptin maintenance only.  At her most recent follow-up visit with Dr. Lindi Adie on 07/26/22, the plan is to obtain repeat imaging for systemic restaging in June 2024.  She has also been seen Dr. 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  She continues in routine follow-up under the care and direction of Dr. Tommy Medal, here locally.    ALLERGIES:  is allergic to aspirin, protonix [pantoprazole], doxycycline, promethazine-codeine, and iodinated contrast media.  Meds: Current Outpatient Medications  Medication Sig Dispense Refill   acetaminophen (TYLENOL) 325 MG tablet Take 2 tablets (650 mg total) by mouth every 6 (six) hours as needed for mild pain (or Fever >/= 101). 12 tablet 0   albuterol (VENTOLIN HFA) 108 (90 Base) MCG/ACT inhaler Inhale 2 puffs into the lungs every 6 (six) hours as needed for wheezing or shortness of breath (cough). 18 g 5   azithromycin (ZITHROMAX Z-PAK) 250 MG tablet Take 2 tablets (500 mg total) by mouth daily for 1 day, THEN 1 tablet (250 mg total) daily for 4 days. 6 tablet 0   chlorpheniramine-HYDROcodone (TUSSIONEX) 10-8 MG/5ML Take 5 mLs by mouth 2 times daily. 115 mL 0   cholecalciferol (VITAMIN D3) 25 MCG (1000 UNIT) tablet Take 1,000 Units by mouth daily.      Fluticasone-Salmeterol,sensor, (AIRDUO DIGIHALER) 113-14 MCG/ACT AEPB Inhale 1 puff into the lungs 2 (two) times daily. 1 each 4   lidocaine-prilocaine (EMLA) cream APPLY EXTERNALLY TO THE AFFECTED AREA 1 TIME 30 g 3   Multiple Vitamins-Minerals (EMERGEN-C VITAMIN C PO) Take by mouth.     predniSONE (DELTASONE) 50 MG tablet TAKE 1 TABLET BY MOUTH 13 HOURS AND 7 HOURS AND 1 HOUR PRIOR TO CT SCAN 3 tablet 0   SYMBICORT 160-4.5 MCG/ACT inhaler Inhale 2 puffs into the lungs 2 (two) times daily.     venlafaxine XR (EFFEXOR-XR) 37.5 MG 24 hr capsule Take 37.5 mg by mouth daily.     Vitamin E 200 units TABS Take by mouth.     voriconazole (VFEND) 200 MG tablet Take 1 tablet (200 mg total) by mouth 2 (two) times daily. 60 tablet 11   No current facility-administered medications for this encounter.   Facility-Administered Medications Ordered in Other Encounters  Medication Dose Route Frequency Provider Last Rate Last Admin   alteplase (CATHFLO ACTIVASE) injection 2 mg  2 mg Intracatheter Once PRN Nicholas Lose, MD       sodium chloride flush (NS) 0.9 % injection 10 mL  10 mL Intracatheter PRN Nicholas Lose, MD   10 mL at 07/06/21 1428    Physical Findings: Unable to assess due to telephone follow up visit format.  Lab Findings: Lab Results  Component Value Date   WBC 11.3 (H) 05/24/2022   WBC 8.7 07/26/2021   HGB 12.1 05/24/2022   HGB 13.1 08/12/2014   HCT 35.7 (L) 05/24/2022   HCT 40.4 08/12/2014   PLT 406 (H) 05/24/2022   PLT 228 08/12/2014    Lab Results  Component Value Date   NA 139 05/24/2022   NA 142 08/12/2014   K 4.5 05/24/2022  K 4.6 08/12/2014   CHLORIDE 107 08/12/2014   CO2 31 05/24/2022   CO2 26 08/12/2014   GLUCOSE 56 (L) 05/24/2022   GLUCOSE 94 08/12/2014   GLUCOSE 98 12/20/2012   BUN 11 05/24/2022   BUN 10.1 08/12/2014   CREATININE 0.82 05/24/2022   CREATININE 0.88 03/11/2019   CREATININE 0.9 08/12/2014   BILITOT 0.2 (L) 05/24/2022   BILITOT 0.25 08/12/2014    ALKPHOS 74 05/24/2022   ALKPHOS 68 08/12/2014   AST 14 (L) 05/24/2022   AST 15 08/12/2014   ALT 8 05/24/2022   ALT 11 08/12/2014   PROT 8.2 (H) 05/24/2022   PROT 7.1 08/12/2014   ALBUMIN 4.1 05/24/2022   ALBUMIN 3.8 08/12/2014   CALCIUM 10.1 05/24/2022   CALCIUM 9.3 08/12/2014   ANIONGAP 4 (L) 05/24/2022    Radiographic Findings: MR Brain W Wo Contrast  Result Date: 07/16/2022 CLINICAL DATA:  Provided history: Metastasis to brain. Brain metastases, assess treatment response; 3T SRS protocol. EXAM: MRI HEAD WITHOUT AND WITH CONTRAST TECHNIQUE: Multiplanar, multiecho pulse sequences of the brain and surrounding structures were obtained without and with intravenous contrast. CONTRAST:  5 mL Vueway intravenous contrast. COMPARISON:  Prior brain MRI examinations 04/13/2022 and earlier. FINDINGS: Mild intermittent motion a shin. Brain: Redemonstrated postoperative changes from prior frontal craniotomy. Irregular and curvilinear foci of enhancement along the margins of the right frontal lobe resection cavity have not significantly changed (for instance as seen on series 17, image 119). T2 FLAIR hyperintense signal abnormality surrounding the resection cavity has slightly decreased. Unchanged chronic blood products along the resection cavity margins. Multiple enhancing lesions elsewhere within the supratentorial and infratentorial brain, as follows. A 1.3 x 1.2 cm enhancing lesion within the high medial right frontal lobe has not significantly changed in size (series 17, image 152) (remeasured on prior). T2 FLAIR hyperintense signal abnormality surrounding this lesion has also not significantly changed. A 2.0 x 2.4 x 2.0 cm enhancing lesion within the left parietooccipital lobes has significantly increased in size (series 17, image 90)(previously measuring 1.3 x 1.3 x 1.4 cm, when remeasured on prior). Moderate surrounding edema has also progressed. As before, there are chronic blood products associated  with this lesion. A 2 mm enhancing lesion within the medial left occipital lobe is unchanged in size (series 17, image 84). A 2 mm enhancing lesion more inferiorly within the left occipital lobe is unchanged (series 17, image 70). A 1.1 x 0.9 cm enhancing lesion within the anteroinferior right frontal lobe has not significantly changed in size (series 17, image 91). Surrounding T2 FLAIR hyperintense signal abnormality has also remained stable. As before, there is superimposed chronic encephalomalacia at this site. A 2.7 x 2.7 x 3.0 cm enhancing lesion within the anterior right temporal lobe has increased in size (series 17, image 66) (previously measuring 2.1 x 2.3 x 2.0 cm, when remeasured on prior). Prominent surrounding edema within the right cerebral hemisphere has also increased. Progressive mass effect related to this lesion, with partial effacement of the right lateral ventricle, and now 4 mm leftward midline shift. Additionally, the medial right temporal lobe now exerts some mass effect upon the right cerebral peduncle (series 10, image 28). A punctate enhancing lesion within the medial left temporal lobe has not significantly changed in size (best appreciated on series 18, image 70). A 9 mm enhancing lesion within the superior cerebellar vermis has increased in size (series 17, image 75) (previously measuring 7 mm). There is no significant surrounding edema. A 9 x  5 mm enhancing lesion within the pons has subtly increased in size (series 17, image 62) (previously measuring 8 x 5 mm). Surrounding T2 FLAIR hyperintense signal abnormality has not significantly changed. Unchanged punctate enhancing lesion within the left cerebellar hemisphere (series 17, image 46). No significant surrounding edema. No new intracranial metastasis is identified. Background multifocal T2 FLAIR hyperintense signal abnormality within the cerebral white matter, nonspecific but compatible with chronic small vessel ischemic disease.  There is no acute infarct. No extra-axial fluid collection. Vascular: Maintained flow voids within the proximal large arterial vessels. Skull and upper cervical spine: Bifrontal cranioplasty. No focal suspicious osseous lesion. Sinuses/Orbits: No mass or acute finding within the imaged orbits. Trace mucosal thickening scattered within the paranasal sinuses. Impression #1 will be called to the ordering clinician or representative by the Radiologist Assistant, and communication documented in the PACS or [employer]. IMPRESSION: 1. Enhancing lesions within the anterior right temporal lobe, left parietooccipital lobes, superior cerebellar vermis and within the pons have increased in size since the prior brain MRI of 04/13/2022, as described. Edema surrounding the lesions within the right temporal lobe and left parietooccipital lobes has progressed. Mass effect related to the right temporal lobe lesion has also progressed, now with 4 mm leftward midline shift. Additionally, the medial right temporal lobe now exerts some mass effect upon the right cerebral peduncle. 2. The remaining lesions within the supratentorial and infratentorial brain have not significantly changed. No new intracranial metastasis is identified. Electronically Signed   By: Kellie Simmering D.O.   On: 07/16/2022 12:46    Impression/Plan: 1.         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  She has been having daily headaches for the past month. We reviewed her most recent MRI brain scan from 07/14/22 which demonstrates an interval increase in size of the lesions within the anterior right temporal lobe, left parietooccipital lobe, superior cerebellar vermis and within the pons.  Edema surrounding the lesions within the right temporal lobe and left parietooccipital lobes has progressed. Mass effect related to the right temporal lobe lesion has also progressed, now with 4 mm  leftward midline shift. Additionally, the medial right temporal lobe edema now exerts some mass effect upon the right cerebral peduncle. The remaining lesions within the supratentorial and infratentorial brain were not significantly changed and no new intracranial metastasis were identified. This study was fused with her prior treatment plans and reviewed personally by Dr. Tammi Klippel, confirming that these lesions have all previously been treated. 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 We discussed starting Trental 400 mg po TID, Vitamin E 400 IU BID as well as Dex '2mg'$  po BID. We also recommended she meet with Dr. Mickeal Skinner and she is in favor of this.  We will set up a consult visit with him, first available, and will plan to repeat the MRI brain scan in 2 months unless Dr. Mickeal Skinner prefers to repeat  the imaging sooner. She appears to have a good understanding of these recommendations and is comfortable and in agreement with the stated plan.  She knows that she is welcome to call at anytime in the interim with any questions or concerns.  She will also continue in routine follow-up under the care and direction of Dr. Lindi Adie for continued monitoring and management  of her systemic disease.   We personally spent 30 minutes in this encounter including chart review, reviewing radiological studies, telephone conversation with the patient, entering orders and completing documentation.   Nicholos Johns, PA-C    Tyler Pita, MD  [facility] Direct Dial: [phone]  Fax: [phone] [email or web address]  Skype  LinkedIn

## 2022-07-27 NOTE — Progress Notes (Signed)
Telephone nursing interview for patient to receive most recent MRI results from 07/14/2022.  I verified patient's identity and began nursing interview. Patient reports getting over a sinus infection that is causing sinus pain 4/10. No related issues reported at this time.  Meaningful use complete.  Patient aware of her 9:00am-07/27/22 telephone appointment w/ Ashlyn Bruning PA-C. I left my extension [phone] in case patient needs anything. Patient verbalized understanding.  This concludes the interview.   Leandra Kern, LPN

## 2022-07-27 NOTE — Telephone Encounter (Signed)
Left message about 1/23 9:00 consult with Dr. Mickeal Skinner. Included my contact info in case she has conflict or questions about this visit.   Mont Dutton R.T.(R)(T) Radiation Special Procedures Navigator

## 2022-08-01 ENCOUNTER — Telehealth (HOSPITAL_COMMUNITY): Payer: Self-pay

## 2022-08-01 ENCOUNTER — Telehealth: Payer: Self-pay | Admitting: Hematology and Oncology

## 2022-08-01 NOTE — Telephone Encounter (Signed)
Scheduled appointment per WQ. Left voicemail. 

## 2022-08-02 ENCOUNTER — Other Ambulatory Visit: Payer: Self-pay

## 2022-08-02 ENCOUNTER — Telehealth: Payer: Self-pay | Admitting: Internal Medicine

## 2022-08-02 ENCOUNTER — Inpatient Hospital Stay: Payer: 59 | Admitting: Internal Medicine

## 2022-08-02 VITALS — BP 106/60 | HR 62 | Temp 97.3°F | Resp 18 | Wt 99.9 lb

## 2022-08-02 DIAGNOSIS — Z5112 Encounter for antineoplastic immunotherapy: Secondary | ICD-10-CM | POA: Diagnosis not present

## 2022-08-02 DIAGNOSIS — C7931 Secondary malignant neoplasm of brain: Secondary | ICD-10-CM | POA: Diagnosis not present

## 2022-08-02 MED ORDER — AMITRIPTYLINE HCL 50 MG PO TABS: 50.0000 mg | ORAL_TABLET | Freq: Every day | ORAL | 1 refills | Status: DC

## 2022-08-02 NOTE — Telephone Encounter (Signed)
Called patient per 1/23 los notes to schedule f/u. Patient scheduled and notified.

## 2022-08-02 NOTE — Progress Notes (Signed)
[place] [facility] [place], [place] [address] [phone]   New Patient Evaluation  Date of Service: 08/02/22 Patient Name: Melissa [name] Patient MRN: [id number] Patient DOB: 1966/06/15 Provider: Ventura Sellers, MD  Identifying Statement:  Melissa [name] is a 57 y.o. female with Metastasis to brain [facility]) who presents for initial consultation and evaluation regarding cancer associated neurologic deficits.    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 Ki-67 was 70% HER-2 was amplified with a ratio 2.91   07/12/2012 - 07/17/2013 Neo-Adjuvant Chemotherapy   TCH 6 followed by Herceptin maintenance   12/11/2012 Surgery   Left breast lumpectomy: 1.8 cm tumor 1 positive sentinel node, axillary lymph node dissection 02/08/2013 showed 0/13 lymph nodes   03/25/2013 - 05/06/2013 Radiation Therapy   Adjuvant radiation therapy   06/05/2013 - 07/20/2017 Anti-estrogen oral therapy   Tamoxifen 20 mg daily   07/27/2017 Relapse/Recurrence   MRI Brain: 3.4 x 2.9 x 2.9 cm RIGHT frontal lobe mass with imaging characteristics of solitary metastasis. Extensive vasogenic edema resulting in 9 mm RIGHT to LEFT midline shift. Equivocal very early LEFT ventricle entrapment.    08/04/2017 Surgery   Rt frontal brain resection: Poorly differentiated tumor IHC suggests breast primary ER and PR Positive   08/25/2017 - 09/04/2017 Radiation Therapy   Stereotactic radiation   09/18/2017 -  Anti-estrogen oral therapy   Lapatinib with letrozole   12/18/2017 - 12/19/2017 Radiation Therapy   New right  parietal lobe metastases status post [facility]   08/27/2018 - 08/27/2020 Radiation Therapy   SRS to new brain metastases   05/09/2019 Relapse/Recurrence   Interval increase in size of the enhancing nodular left internal mammary soft tissue 2.8 cm.  Redemonstrated enlarged supraclavicular, lower cervical and lower posterior cervical nodes unchanged.  Interval increase in the bony erosion of the posterior and lateral left third rib, increasing soft tissue lesion eroding the left sternal body 3.1 cm was 2.5 cm.  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([place])   06/19/2019 - 05/05/2020 Chemotherapy   ado-trastuzumab emtansine (KADCYLA)     Port-A-Cath in place   CNS Oncologic History 10/12/21: SRS to pons 16 Gy [facility] Klippel) 06/29/21: SRS to 2x L occipital 03/17/21: SRS x3 08/27/20: SRS x5 05/13/20: SRS x4  03/25/19: SRS right frontal 5 mm 09/04/18: SRS x2 right frontal 12/18/17: SRS R parietal 09/04/17: Post op SRS R frontal, 5 fxs 08/04/17: Craniotomy, resection R frontal met ([name])  History of Present Illness: The patient's records from the referring physician were obtained and reviewed and the patient interviewed to confirm this HPI.  Melissa [name] presents today for headache evaluation.  She describes pain "along the temples"  mild to moderate, every day for the past month.  Prior to this she had very sporadic migraine type headaches, which these differ from.  No associated neurologic deficits.  She does dose codeine based cough syrup every night (for the past year), and has chronic sleep impairment.  Decadron  '2mg'$  twice per day, started last week, has not helped any symptoms.  Also describes some "shadowy visual loss" on the right side, at times.  Continues on herceptin with Dr. Lindi Adie.  Medications: Current Outpatient Medications on File Prior to Visit  Medication Sig Dispense Refill   acetaminophen (TYLENOL) 325 MG tablet Take 2 tablets (650 mg total) by mouth every 6 (six) hours as needed for mild pain (or Fever >/= 101). 12 tablet 0   albuterol (VENTOLIN HFA) 108 (90 Base) MCG/ACT inhaler Inhale 2 puffs into the lungs every 6 (six) hours as needed for wheezing or shortness of breath (cough). 18 g 5   chlorpheniramine-HYDROcodone (TUSSIONEX) 10-8 MG/5ML Take 5 mLs by mouth 2 times daily. 115 mL 0   cholecalciferol (VITAMIN D3) 25 MCG (1000 UNIT) tablet Take 1,000 Units by mouth daily.     dexamethasone (DECADRON) 2 MG tablet Take 1 tablet (2 mg total) by mouth 2 (two) times daily with a meal. 60 tablet 0   Fluticasone-Salmeterol,sensor, (AIRDUO DIGIHALER) 113-14 MCG/ACT AEPB Inhale 1 puff into the lungs 2 (two) times daily. 1 each 4   lidocaine-prilocaine (EMLA) cream APPLY EXTERNALLY TO THE AFFECTED AREA 1 TIME 30 g 3   Multiple Vitamins-Minerals (EMERGEN-C VITAMIN C PO) Take by mouth.     pentoxifylline (TRENTAL) 400 MG CR tablet Take 1 tablet (400 mg total) by mouth 3 (three) times daily with meals. 90 tablet 1   predniSONE (DELTASONE) 50 MG tablet TAKE 1 TABLET BY MOUTH 13 HOURS AND 7 HOURS AND 1 HOUR PRIOR TO CT SCAN 3 tablet 0   SYMBICORT 160-4.5 MCG/ACT inhaler Inhale 2 puffs into the lungs 2 (two) times daily.     venlafaxine XR (EFFEXOR-XR) 37.5 MG 24 hr capsule Take 37.5 mg by mouth daily.     Vitamin E 200 units TABS Take by mouth.     voriconazole (VFEND) 200 MG tablet Take 1 tablet (200 mg total) by mouth 2 (two) times daily. 60 tablet 11   Current Facility-Administered Medications on File Prior to Visit  Medication Dose Route Frequency Provider Last Rate Last Admin   alteplase  (CATHFLO ACTIVASE) injection 2 mg  2 mg Intracatheter Once PRN Nicholas Lose, MD       sodium chloride flush (NS) 0.9 % injection 10 mL  10 mL Intracatheter PRN Nicholas Lose, MD   10 mL at 07/06/21 1428    Allergies:  Allergies  Allergen Reactions   Aspirin Anaphylaxis and Shortness Of Breath    THROAT CLOSES   Protonix [Pantoprazole] Nausea Only and Other (See Comments)    Also caused a "film in the mouth" and caused chest pressure   Doxycycline Other (See Comments)    Hallucinations, headaches   Promethazine-Codeine Cough    Worsening cough and insomnia    Iodinated Contrast Media Rash    Patient allergic to contrast used in radiation oncology for CT simulation    Past Medical History:  Past Medical History:  Diagnosis Date   Anemia    Arthritis    knees and hips   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[place]) 02/26/2020   Asthma    Breast cancer ([place])    Bronchiectasis (HCC)    Bronchiolitis  Cancer [facility])    breast cancer 2014   Cancer, metastatic to liver ([place])    3664   Complication of anesthesia    bp dropped + desat    COPD (chronic obstructive pulmonary disease) (HCC)    Dyspnea    DOE   GERD (gastroesophageal reflux disease)    H/O coccidioidomycosis    was reason for lung lobectomy   Headache(784.0)    due to eye strain or not eating   History of anemia    no current problem   History of asthma    as a child   History of breast cancer 2014   left   History of chemotherapy    finished 07/17/2013   History of hiatal hernia    AGE 26   Hx of radiation therapy 03/25/13-05/06/13   left breast 5000 cGy/25 sessions, left breast boost 1000 cGy/5 sessions   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   Pneumonia    LAST FLARE UP 01/2018   Rash 02/22/2021   Runny nose 07/30/2013   clear drainage   Smoker 05/24/2021   Wears dentures    upper   Past Surgical History:  Past Surgical History:  Procedure Laterality Date   APPLICATION OF CRANIAL NAVIGATION N/A 08/04/2017   Procedure: APPLICATION OF CRANIAL  NAVIGATION;  Surgeon: [name], [name], MD;  Location: [place];  Service: Neurosurgery;  Laterality: N/A;   AXILLARY LYMPH NODE DISSECTION Left 02/08/2013   Procedure: LEFT AXILLARY DISSECTION;  Surgeon: Edward Jolly, MD;  Location: [place];  Service: General;  Laterality: Left;   BREAST CYST EXCISION Right 2006   BREAST LUMPECTOMY Left 2014   BREAST LUMPECTOMY WITH NEEDLE LOCALIZATION AND AXILLARY SENTINEL LYMPH NODE BX Left 12/31/2012   Procedure: NEEDLE LOCALIZATION LEFT BREAST LUMPECTOMY AND LEFT AXILLARY SENTENIAL LYMPH NODE BX;  Surgeon: Edward Jolly, MD;  Location: [place];  Service: General;  Laterality: Left;   BRONCHIAL BRUSHINGS  03/17/2020   Procedure: BRONCHIAL BRUSHINGS;  Surgeon: Garner Nash, DO;  Location: [place] [facility];  Service: Pulmonary;;   BRONCHIAL WASHINGS  03/17/2020   Procedure: BRONCHIAL WASHINGS;  Surgeon: Garner Nash, DO;  Location: [place];  Service: Pulmonary;;   CESAREAN SECTION  1995/1996   CRANIOTOMY Right 08/04/2017   Procedure: Right Frontal craniotomy for resection of tumor with stereotactic navigation;  Surgeon: [name], [name], MD;  Location: [place];  Service: Neurosurgery;  Laterality: Right;  Right Frontal craniotomy for resection of tumor with stereotactic navigation   IR CV LINE INJECTION  02/19/2021   IR CV LINE INJECTION  11/12/2021   IR IMAGING GUIDED PORT INSERTION  05/31/2019   LUNG LOBECTOMY Left 05/1996   upper lobe - due to [facility] Fever   PORT-A-CATH REMOVAL Right 08/02/2013   Procedure: REMOVAL PORT-A-CATH;  Surgeon: Edward Jolly, MD;  Location: [place];  Service: General;  Laterality: Right;   PORTACATH PLACEMENT  07/02/2012   Procedure: INSERTION PORT-A-CATH;  Surgeon: Edward Jolly, MD;  Location: [place];  Service: General;  Laterality: N/A;  right   VIDEO BRONCHOSCOPY N/A 03/17/2020   Procedure: VIDEO BRONCHOSCOPY;  Surgeon: Garner Nash, DO;  Location: [place] [facility];  Service: Pulmonary;  Laterality: N/A;   VIDEO BRONCHOSCOPY WITH ENDOBRONCHIAL ULTRASOUND N/A 05/23/2018   Procedure: VIDEO BRONCHOSCOPY WITH ENDOBRONCHIAL ULTRASOUND;  Surgeon: Garner Nash, DO;  Location: [place] [facility];  Service: Thoracic;  Laterality: N/A;   Social History:  Social History   Socioeconomic  History   Marital status: Married    Spouse name: Not on file   Number of children: 2   Years of education: Not on file   Highest education level: Not on file  Occupational History   Occupation: Secondary school teacher  Tobacco Use   Smoking status: Every Day    Packs/day: 0.30    Years: 38.00    Total pack years: 11.40    Types: Cigarettes   Smokeless tobacco: Never   Tobacco comments:    10 cigarettes smoked daily 10/19/20 [facility]   Vaping Use   Vaping Use: Never used  Substance and Sexual Activity   Alcohol use: No   Drug use: No   Sexual activity: Not Currently  Other Topics Concern   Not on file  Social History Narrative   Not on file   Social Determinants of Health   Financial Resource Strain: Not on file  Food Insecurity: Not on file  Transportation Needs: Not on file  Physical Activity: Not on file  Stress: Not on file  Social Connections: Not on file  Intimate Partner Violence: Not At Risk (02/20/2018)   Humiliation, Afraid, Rape, and Kick questionnaire    Fear of Current or Ex-Partner: No    Emotionally Abused: No    Physically Abused: No    Sexually Abused: No   Family History:  Family History  Problem Relation Age of Onset   Emphysema Mother        was a smoker   Heart disease Mother    Melanoma Mother        dx in her 31s   Breast cancer Mother 84   Asthma Brother    Breast cancer Cousin        mother's maternal cousin; dx in her 67s    Review of Systems: Constitutional: Doesn't report fevers, chills or abnormal weight loss Eyes: Doesn't report blurriness of vision Ears, nose, mouth, throat, and face: Doesn't  report sore throat Respiratory: Doesn't report cough, dyspnea or wheezes Cardiovascular: Doesn't report palpitation, chest discomfort  Gastrointestinal:  Doesn't report nausea, constipation, diarrhea GU: Doesn't report incontinence Skin: Doesn't report skin rashes Neurological: Per HPI Musculoskeletal: Doesn't report joint pain Behavioral/Psych: Doesn't report anxiety  Physical Exam: Vitals:   08/02/22 0901  BP: 106/60  Pulse: 62  Resp: 18  Temp: (!) 97.3 F (36.3 C)  SpO2: 100%   KPS: 80. General: Alert, cooperative, pleasant, in no acute distress Head: Normal EENT: No conjunctival injection or scleral icterus.  Lungs: Resp effort normal Cardiac: Regular rate Abdomen: Non-distended abdomen Skin: No rashes cyanosis or petechiae. Extremities: No clubbing or edema  Neurologic Exam: Mental Status: Awake, alert, attentive to examiner. Oriented to self and environment. Language is fluent with intact comprehension.  Cranial Nerves: Visual acuity is grossly normal. Visual fields are full. Extra-ocular movements intact. No ptosis. Face is symmetric Motor: Tone and bulk are normal. Power is full in both arms and legs. Reflexes are symmetric, no pathologic reflexes present.  Sensory: Intact to light touch Gait: Normal.   Labs: I have reviewed the data as listed    Component Value Date/Time   NA 139 05/24/2022 1031   NA 142 08/12/2014 0956   K 4.5 05/24/2022 1031   K 4.6 08/12/2014 0956   CL 104 05/24/2022 1031   CL 100 12/20/2012 1509   CO2 31 05/24/2022 1031   CO2 26 08/12/2014 0956   GLUCOSE 56 (L) 05/24/2022 1031   GLUCOSE 94 08/12/2014 0956   GLUCOSE  98 12/20/2012 1509   BUN 11 05/24/2022 1031   BUN 10.1 08/12/2014 0956   CREATININE 0.82 05/24/2022 1031   CREATININE 0.88 03/11/2019 1057   CREATININE 0.9 08/12/2014 0956   CALCIUM 10.1 05/24/2022 1031   CALCIUM 9.3 08/12/2014 0956   PROT 8.2 (H) 05/24/2022 1031   PROT 7.1 08/12/2014 0956   ALBUMIN 4.1 05/24/2022  1031   ALBUMIN 3.8 08/12/2014 0956   AST 14 (L) 05/24/2022 1031   AST 15 08/12/2014 0956   ALT 8 05/24/2022 1031   ALT 11 08/12/2014 0956   ALKPHOS 74 05/24/2022 1031   ALKPHOS 68 08/12/2014 0956   BILITOT 0.2 (L) 05/24/2022 1031   BILITOT 0.25 08/12/2014 0956   GFRNONAA >60 05/24/2022 1031   GFRNONAA 76 03/11/2019 1057   GFRAA >60 03/24/2020 1012   GFRAA >60 02/11/2020 0836   GFRAA 88 03/11/2019 1057   Lab Results  Component Value Date   WBC 11.3 (H) 05/24/2022   NEUTROABS 7.7 05/24/2022   HGB 12.1 05/24/2022   HCT 35.7 (L) 05/24/2022   MCV 94.7 05/24/2022   PLT 406 (H) 05/24/2022    Imaging:  MR Brain W Wo Contrast  Result Date: 07/16/2022 CLINICAL DATA:  Provided history: Metastasis to brain. Brain metastases, assess treatment response; 3T SRS protocol. EXAM: MRI HEAD WITHOUT AND WITH CONTRAST TECHNIQUE: Multiplanar, multiecho pulse sequences of the brain and surrounding structures were obtained without and with intravenous contrast. CONTRAST:  5 mL Vueway intravenous contrast. COMPARISON:  Prior brain MRI examinations 04/13/2022 and earlier. FINDINGS: Mild intermittent motion a shin. Brain: Redemonstrated postoperative changes from prior frontal craniotomy. Irregular and curvilinear foci of enhancement along the margins of the right frontal lobe resection cavity have not significantly changed (for instance as seen on series 17, image 119). T2 FLAIR hyperintense signal abnormality surrounding the resection cavity has slightly decreased. Unchanged chronic blood products along the resection cavity margins. Multiple enhancing lesions elsewhere within the supratentorial and infratentorial brain, as follows. A 1.3 x 1.2 cm enhancing lesion within the high medial right frontal lobe has not significantly changed in size (series 17, image 152) (remeasured on prior). T2 FLAIR hyperintense signal abnormality surrounding this lesion has also not significantly changed. A 2.0 x 2.4 x 2.0 cm  enhancing lesion within the left parietooccipital lobes has significantly increased in size (series 17, image 90)(previously measuring 1.3 x 1.3 x 1.4 cm, when remeasured on prior). Moderate surrounding edema has also progressed. As before, there are chronic blood products associated with this lesion. A 2 mm enhancing lesion within the medial left occipital lobe is unchanged in size (series 17, image 84). A 2 mm enhancing lesion more inferiorly within the left occipital lobe is unchanged (series 17, image 70). A 1.1 x 0.9 cm enhancing lesion within the anteroinferior right frontal lobe has not significantly changed in size (series 17, image 91). Surrounding T2 FLAIR hyperintense signal abnormality has also remained stable. As before, there is superimposed chronic encephalomalacia at this site. A 2.7 x 2.7 x 3.0 cm enhancing lesion within the anterior right temporal lobe has increased in size (series 17, image 66) (previously measuring 2.1 x 2.3 x 2.0 cm, when remeasured on prior). Prominent surrounding edema within the right cerebral hemisphere has also increased. Progressive mass effect related to this lesion, with partial effacement of the right lateral ventricle, and now 4 mm leftward midline shift. Additionally, the medial right temporal lobe now exerts some mass effect upon the right cerebral peduncle (series 10, image 28). A  punctate enhancing lesion within the medial left temporal lobe has not significantly changed in size (best appreciated on series 18, image 70). A 9 mm enhancing lesion within the superior cerebellar vermis has increased in size (series 17, image 75) (previously measuring 7 mm). There is no significant surrounding edema. A 9 x 5 mm enhancing lesion within the pons has subtly increased in size (series 17, image 62) (previously measuring 8 x 5 mm). Surrounding T2 FLAIR hyperintense signal abnormality has not significantly changed. Unchanged punctate enhancing lesion within the left cerebellar  hemisphere (series 17, image 46). No significant surrounding edema. No new intracranial metastasis is identified. Background multifocal T2 FLAIR hyperintense signal abnormality within the cerebral white matter, nonspecific but compatible with chronic small vessel ischemic disease. There is no acute infarct. No extra-axial fluid collection. Vascular: Maintained flow voids within the proximal large arterial vessels. Skull and upper cervical spine: Bifrontal cranioplasty. No focal suspicious osseous lesion. Sinuses/Orbits: No mass or acute finding within the imaged orbits. Trace mucosal thickening scattered within the paranasal sinuses. Impression #1 will be called to the ordering clinician or representative by the Radiologist Assistant, and communication documented in the PACS or [employer]. IMPRESSION: 1. Enhancing lesions within the anterior right temporal lobe, left parietooccipital lobes, superior cerebellar vermis and within the pons have increased in size since the prior brain MRI of 04/13/2022, as described. Edema surrounding the lesions within the right temporal lobe and left parietooccipital lobes has progressed. Mass effect related to the right temporal lobe lesion has also progressed, now with 4 mm leftward midline shift. Additionally, the medial right temporal lobe now exerts some mass effect upon the right cerebral peduncle. 2. The remaining lesions within the supratentorial and infratentorial brain have not significantly changed. No new intracranial metastasis is identified. Electronically Signed   By: Kellie Simmering D.O.   On: 07/16/2022 12:46    [facility] Clinician Interpretation: I have personally reviewed the radiological images as listed.  My interpretation, in the context of the patient's clinical presentation, is treatment effect vs true progression   Assessment/Plan Metastasis to brain [facility])  Melissa Guess Sheahan presents with clinical syndrome c/w chronic daily headache.  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    We extensively reviewed analgesia overuse headache syndrome, and discussed basic sleep hygiene interventions.    She is agreeable with trail of Elavil '50mg'$  HS for sleep, headache symptoms.  She is no longer dosing Effexor.  For progressive CNS lesions, etiology is radiation necrosis vs progressive neoplasm.  Next MRI due in early March, if not improved or stabilized we will consider biopsy of the left occipital lesion.  For now will con't decadron '4mg'$  daily, Vit E and Trental as prior.  We spent twenty additional minutes teaching regarding the natural history, biology, and historical experience in the treatment of neurologic complications of cancer.   We appreciate the opportunity to participate in the care of THEORA VANKIRK.   We ask that TAYGAN CONNELL return to clinic in 2 months following next brain MRI, or sooner as needed.  All questions were answered. The patient knows to call the clinic with any problems, questions or concerns. No barriers to learning were detected.  The total time spent in the encounter was 40 minutes and more than 50% was on counseling and review of test results   Ventura Sellers, MD Medical Director of Neuro-Oncology [facility] [place] 08/02/22 9:02 AM

## 2022-08-03 ENCOUNTER — Other Ambulatory Visit: Payer: Self-pay | Admitting: Radiation Therapy

## 2022-08-03 ENCOUNTER — Telehealth: Payer: Self-pay

## 2022-08-03 NOTE — Telephone Encounter (Signed)
Patient called to report that she took amitriptyline last night and she woke up feeling jittery. She relates it to the feeling she gets when she takes some cold medications.    Discussed with patient that this may diminish over time. She will consider taking a dose this PM and will call clinic to discuss with Romie Minus tomorrow.

## 2022-08-04 ENCOUNTER — Encounter: Payer: Self-pay | Admitting: Hematology and Oncology

## 2022-08-11 ENCOUNTER — Telehealth: Payer: Self-pay | Admitting: *Deleted

## 2022-08-11 NOTE — Telephone Encounter (Signed)
Patient called to state concern for new right elbow to hand numbness.  She states there might be some on her left side as well but unsure since she sprained that with injury from dog jumping on her left hand.  Greater concern for right hand since it started 2-3 days ago.  States she stopped amitriptyline as it caused her to feel jittery.  The only fairly new medications are the Decadron '2mg'$  BID, Trental and Vitamin D.    Called seeking guidance on if it could be a side effect of a medication or if she should be evaluated.  Routed to Dr Mickeal Skinner.

## 2022-08-11 NOTE — Telephone Encounter (Signed)
Communicated response to the patient.  She states she will do as advised.

## 2022-08-16 ENCOUNTER — Inpatient Hospital Stay (HOSPITAL_BASED_OUTPATIENT_CLINIC_OR_DEPARTMENT_OTHER): Payer: 59 | Admitting: Hematology and Oncology

## 2022-08-16 ENCOUNTER — Inpatient Hospital Stay: Payer: 59 | Attending: Medical

## 2022-08-16 ENCOUNTER — Encounter: Payer: Self-pay | Admitting: Hematology and Oncology

## 2022-08-16 VITALS — BP 123/74 | HR 65 | Temp 96.8°F | Resp 17 | Wt 100.1 lb

## 2022-08-16 DIAGNOSIS — Z171 Estrogen receptor negative status [ER-]: Secondary | ICD-10-CM

## 2022-08-16 DIAGNOSIS — C7951 Secondary malignant neoplasm of bone: Secondary | ICD-10-CM | POA: Insufficient documentation

## 2022-08-16 DIAGNOSIS — C50212 Malignant neoplasm of upper-inner quadrant of left female breast: Secondary | ICD-10-CM | POA: Diagnosis present

## 2022-08-16 DIAGNOSIS — C787 Secondary malignant neoplasm of liver and intrahepatic bile duct: Secondary | ICD-10-CM | POA: Insufficient documentation

## 2022-08-16 DIAGNOSIS — C7931 Secondary malignant neoplasm of brain: Secondary | ICD-10-CM | POA: Diagnosis present

## 2022-08-16 DIAGNOSIS — Z5112 Encounter for antineoplastic immunotherapy: Secondary | ICD-10-CM | POA: Diagnosis present

## 2022-08-16 MED ORDER — ACETAMINOPHEN 325 MG PO TABS
650.0000 mg | ORAL_TABLET | Freq: Once | ORAL | Status: AC
  Administered 2022-08-16: 650 mg via ORAL
  Filled 2022-08-16: qty 2

## 2022-08-16 MED ORDER — SODIUM CHLORIDE 0.9 % IV SOLN: Freq: Once | INTRAVENOUS | Status: AC

## 2022-08-16 MED ORDER — TRASTUZUMAB-ANNS CHEMO 150 MG IV SOLR
6.0000 mg/kg | Freq: Once | INTRAVENOUS | Status: AC
  Administered 2022-08-16: 300 mg via INTRAVENOUS
  Filled 2022-08-16: qty 14.29

## 2022-08-16 MED ORDER — SODIUM CHLORIDE 0.9% FLUSH
10.0000 mL | INTRAVENOUS | Status: DC | PRN
  Administered 2022-08-16: 10 mL

## 2022-08-16 MED ORDER — HEPARIN SOD (PORK) LOCK FLUSH 100 UNIT/ML IV SOLN
500.0000 [IU] | Freq: Once | INTRAVENOUS | Status: AC | PRN
  Administered 2022-08-16: 500 [IU]

## 2022-08-16 MED ORDER — DIPHENHYDRAMINE HCL 25 MG PO CAPS
25.0000 mg | ORAL_CAPSULE | Freq: Once | ORAL | Status: AC
  Administered 2022-08-16: 25 mg via ORAL
  Filled 2022-08-16: qty 1

## 2022-08-16 NOTE — Progress Notes (Signed)
Patient Care Team: Charlane Ferretti, MD as PCP - General (Internal Medicine) [name], MD as Referring Physician (Emergency Medicine)  DIAGNOSIS:  Encounter Diagnosis  Name Primary?   Malignant neoplasm of upper-inner quadrant of left breast in female, estrogen receptor negative ([place]) Yes    SUMMARY OF ONCOLOGIC HISTORY: Oncology History  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 Ki-67 was 70% HER-2 was amplified with a ratio 2.91   07/12/2012 - 07/17/2013 Neo-Adjuvant Chemotherapy   TCH 6 followed by Herceptin maintenance   12/11/2012 Surgery   Left breast lumpectomy: 1.8 cm tumor 1 positive sentinel node, axillary lymph node dissection 02/08/2013 showed 0/13 lymph nodes   03/25/2013 - 05/06/2013 Radiation Therapy   Adjuvant radiation therapy   06/05/2013 - 07/20/2017 Anti-estrogen oral therapy   Tamoxifen 20 mg daily   07/27/2017 Relapse/Recurrence   MRI Brain: 3.4 x 2.9 x 2.9 cm RIGHT frontal lobe mass with imaging characteristics of solitary metastasis. Extensive vasogenic edema resulting in 9 mm RIGHT to LEFT midline shift. Equivocal very early LEFT ventricle entrapment.    08/04/2017 Surgery   Rt frontal brain resection: Poorly differentiated tumor IHC suggests breast primary ER and PR Positive   08/25/2017 - 09/04/2017 Radiation Therapy   Stereotactic radiation   09/18/2017 -  Anti-estrogen oral therapy   Lapatinib with letrozole   12/18/2017 - 12/19/2017 Radiation Therapy   New right parietal lobe metastases status post [facility]   08/27/2018 - 08/27/2020 Radiation Therapy   SRS to new brain metastases   05/09/2019 Relapse/Recurrence   Interval increase in size of the enhancing nodular left internal mammary soft tissue 2.8 cm.  Redemonstrated  enlarged supraclavicular, lower cervical and lower posterior cervical nodes unchanged.  Interval increase in the bony erosion of the posterior and lateral left third rib, increasing soft tissue lesion eroding the left sternal body 3.1 cm was 2.5 cm.  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 with the above-mentioned history of metastatic breast cancer currently on treatment with   Herceptin. She presents to the clinic today for follow-up.  She has no issues tolerating Herceptin.  She was informed by Dr. Mickeal Skinner that there were areas in the brain that have increased in size and she was started on Decadron to see if that may be inflammation.  She has another MRI brain being planned for March.  Previously we had tried several anti-HER2 medications that she could not tolerate.   ALLERGIES:  is allergic to aspirin, protonix [pantoprazole], doxycycline, promethazine-codeine, sulfamethoxazole-trimethoprim, and iodinated contrast media.  MEDICATIONS:  Current Outpatient Medications  Medication Sig Dispense Refill   acetaminophen (TYLENOL) 325 MG tablet Take  2 tablets (650 mg total) by mouth every 6 (six) hours as needed for mild pain (or Fever >/= 101). 12 tablet 0   albuterol (VENTOLIN HFA) 108 (90 Base) MCG/ACT inhaler Inhale 2 puffs into the lungs every 6 (six) hours as needed for wheezing or shortness of  breath (cough). 18 g 5   amitriptyline (ELAVIL) 50 MG tablet Take 1 tablet (50 mg total) by mouth at bedtime. 60 tablet 1   chlorpheniramine-HYDROcodone (TUSSIONEX) 10-8 MG/5ML Take 5 mLs by mouth 2 times daily. 115 mL 0   cholecalciferol (VITAMIN D3) 25 MCG (1000 UNIT) tablet Take 1,000 Units by mouth daily.     dexamethasone (DECADRON) 2 MG tablet Take 1 tablet (2 mg total) by mouth 2 (two) times daily with a meal. 60 tablet 0   Fluticasone-Salmeterol,sensor, (AIRDUO DIGIHALER) 113-14 MCG/ACT AEPB Inhale 1 puff into the lungs 2 (two) times daily. 1 each 4   lidocaine-prilocaine (EMLA) cream APPLY EXTERNALLY TO THE AFFECTED AREA 1 TIME 30 g 3   Multiple Vitamins-Minerals (EMERGEN-C VITAMIN C PO) Take by mouth.     pentoxifylline (TRENTAL) 400 MG CR tablet Take 1 tablet (400 mg total) by mouth 3 (three) times daily with meals. 90 tablet 1   predniSONE (DELTASONE) 50 MG tablet TAKE 1 TABLET BY MOUTH 13 HOURS AND 7 HOURS AND 1 HOUR PRIOR TO CT SCAN (Patient not taking: Reported on 08/02/2022) 3 tablet 0   SYMBICORT 160-4.5 MCG/ACT inhaler Inhale 2 puffs into the lungs 2 (two) times daily.     venlafaxine XR (EFFEXOR-XR) 37.5 MG 24 hr capsule Take 37.5 mg by mouth daily.     Vitamin E 200 units TABS Take by mouth.     voriconazole (VFEND) 200 MG tablet Take 1 tablet (200 mg total) by mouth 2 (two) times daily. 60 tablet 11   No current facility-administered medications for this visit.   Facility-Administered Medications Ordered in Other Visits  Medication Dose Route Frequency Provider Last Rate Last Admin   alteplase (CATHFLO ACTIVASE) injection 2 mg  2 mg Intracatheter Once PRN Nicholas Lose, MD       sodium chloride flush (NS) 0.9 % injection 10 mL  10 mL Intracatheter PRN Nicholas Lose, MD   10 mL at 07/06/21 1428    PHYSICAL EXAMINATION: ECOG PERFORMANCE STATUS: 1 - Symptomatic but completely ambulatory  Vitals:   08/16/22 0837  BP: 123/74  Pulse: 65  Resp: 17  Temp: (!) 96.8 F (36  C)  SpO2: 98%   Filed Weights   08/16/22 0837  Weight: 100 lb 2 oz (45.4 kg)      LABORATORY DATA:  I have reviewed the data as listed    Latest Ref Rng & Units 05/24/2022   10:31 AM 04/12/2022    8:20 AM 02/28/2022    9:32 AM  CMP  Glucose 70 - 99 mg/dL 56  96  100   BUN 6 - 20 mg/dL '11  13  12   '$ Creatinine 0.44 - 1.00 mg/dL 0.82  0.70  0.73   Sodium 135 - 145 mmol/L 139  138  139   Potassium 3.5 - 5.1 mmol/L 4.5  4.1  4.1   Chloride 98 - 111 mmol/L 104  106  106   CO2 22 - 32 mmol/L '31  29  29   '$ Calcium 8.9 - 10.3 mg/dL 10.1  9.4  10.0   Total Protein 6.5 - 8.1 g/dL 8.2  7.2  7.4   Total Bilirubin 0.3 -  1.2 mg/dL 0.2  0.2  0.2   Alkaline Phos 38 - 126 U/L 74  71  72   AST 15 - 41 U/L '14  16  19   '$ ALT 0 - 44 U/L '8  13  13     '$ Lab Results  Component Value Date   WBC 11.3 (H) 05/24/2022   HGB 12.1 05/24/2022   HCT 35.7 (L) 05/24/2022   MCV 94.7 05/24/2022   PLT 406 (H) 05/24/2022   NEUTROABS 7.7 05/24/2022    ASSESSMENT & PLAN:  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  [place] brain: 08/25/2017-09/04/2017 2. Anti Her 2 therapy with Lapatinib started 09/17/2017-05/14/2019: Stopped for progression 3.  I discontinued tamoxifen and started her on letrozole 2.5 mg daily.  05/14/2019 stopped for progression 4.  Stereotactic radiosurgery 12/19/2017 to the new right parietal lobe metastases. 5.  Kadcyla: Received 16 cycles discontinued 05/05/2020 6.   Tucatinib Xeloda: Discontinued because of hand-foot syndrome 06/11/2020-12/15/2020 7. Herceptin/Neratinib: unable to tolerate due to diarrhea and weight loss 8.  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  She and her husband are getting therapy.   Brain MRI 07/14/2022: Enhancing lesions within the anterior right temporal lobe, left parietal to occipital lobes, superior cerebellar vermis and within the pons have increased in size.  Dr. Mickeal Skinner started her on steroids and an MRI of the brain is being planned for first week of March.  She was informed that, it is possible that she might need a PET scan for the brain and biopsy if necessary.   Continue with every 3-week Herceptin next scans will be done in June. I will see the patient every 6 weeks for follow-ups.    No orders of the defined types were placed in this encounter.  The patient has a good understanding of the overall plan. she agrees with it. she will call with any problems that may develop before the next visit here. Total time spent: 30 mins including face to face time and time  spent for planning, charting and co-ordination of care   Harriette Ohara, MD 08/16/22    I Gardiner Coins am acting as a Education administrator for [employer]  I have reviewed the above documentation for accuracy and completeness, and I agree with the above.

## 2022-08-16 NOTE — Patient Instructions (Signed)
[facility]  Discharge Instructions: Thank you for choosing [facility] to provide your oncology and hematology care.   If you have a lab appointment with the Cancer Center, please go directly to the Cancer Center and check in at the registration area.   Wear comfortable clothing and clothing appropriate for easy access to any Portacath or PICC line.   We strive to give you quality time with your provider. You may need to reschedule your appointment if you arrive late (15 or more minutes).  Arriving late affects you and other patients whose appointments are after yours.  Also, if you miss three or more appointments without notifying the office, you may be dismissed from the clinic at the provider's discretion.      For prescription refill requests, have your pharmacy contact our office and allow 72 hours for refills to be completed.    Today you received the following chemotherapy and/or immunotherapy agents: Kanjinti.      To help prevent nausea and vomiting after your treatment, we encourage you to take your nausea medication as directed.  BELOW ARE SYMPTOMS THAT SHOULD BE REPORTED IMMEDIATELY: *FEVER GREATER THAN 100.4 F (38 C) OR HIGHER *CHILLS OR SWEATING *NAUSEA AND VOMITING THAT IS NOT CONTROLLED WITH YOUR NAUSEA MEDICATION *UNUSUAL SHORTNESS OF BREATH *UNUSUAL BRUISING OR BLEEDING *URINARY PROBLEMS (pain or burning when urinating, or frequent urination) *BOWEL PROBLEMS (unusual diarrhea, constipation, pain near the anus) TENDERNESS IN MOUTH AND THROAT WITH OR WITHOUT PRESENCE OF ULCERS (sore throat, sores in mouth, or a toothache) UNUSUAL RASH, SWELLING OR PAIN  UNUSUAL VAGINAL DISCHARGE OR ITCHING   Items with * indicate a potential emergency and should be followed up as soon as possible or go to the Emergency Department if any problems should occur.  Please show the CHEMOTHERAPY ALERT CARD or IMMUNOTHERAPY ALERT CARD at  check-in to the Emergency Department and triage nurse.  Should you have questions after your visit or need to cancel or reschedule your appointment, please contact [facility]  Dept: [phone]  and follow the prompts.  Office hours are 8:00 a.m. to 4:30 p.m. Monday - Friday. Please note that voicemails left after 4:00 p.m. may not be returned until the following business day.  We are closed weekends and major holidays. You have access to a nurse at all times for urgent questions. Please call the main number to the clinic Dept: [phone] and follow the prompts.   For any non-urgent questions, you may also contact your provider using MyChart. We now offer e-Visits for anyone 18 and older to request care online for non-urgent symptoms. For details visit [email or web address].   Also download the MyChart app! Go to the app store, search "MyChart", open the app, select [facility], and log in with your MyChart username and password.   

## 2022-08-16 NOTE — Assessment & Plan Note (Signed)
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  [place] brain: 08/25/2017-09/04/2017 2. Anti Her 2 therapy with Lapatinib started 09/17/2017-05/14/2019: Stopped for progression 3.  I discontinued tamoxifen and started her on letrozole 2.5 mg daily.  05/14/2019 stopped for progression 4.  Stereotactic radiosurgery 12/19/2017 to the new right parietal lobe metastases. 5.  Kadcyla: Received 16 cycles discontinued 05/05/2020 6.  Tucatinib Xeloda: Discontinued because of hand-foot syndrome 06/11/2020-12/15/2020 7. Herceptin/Neratinib: unable to tolerate due to diarrhea and weight loss 8.  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  She and her husband are  getting therapy.   Brain MRI 07/14/2022: Enhancing lesions within the anterior right temporal lobe, left parietal to occipital lobes, superior cerebellar vermis and within the pons have increased in size   Continue with every 3-week Herceptin next scans will be done in June. I will see the patient every 6 weeks for follow-ups.

## 2022-08-22 ENCOUNTER — Telehealth: Payer: Self-pay

## 2022-08-22 NOTE — Telephone Encounter (Signed)
PC from pt stating she started taking her steroids on 1/24  Decadron 2 mg #2 tabs after breakfast.  She has developed the moon face but has noticed her throat is sore,feels tight and having difficulty swallowing.  She is not having any shortness of breath.  She is also having muscle and joint pain and mouth sores.  She did not take them yesterday or this morning until she received recommendations from the Dr.  Please advise.

## 2022-08-22 NOTE — Telephone Encounter (Signed)
Pt advised with VU and agreed to plan.

## 2022-08-27 ENCOUNTER — Other Ambulatory Visit: Payer: Self-pay | Admitting: Urology

## 2022-09-03 ENCOUNTER — Encounter: Payer: Self-pay | Admitting: Hematology and Oncology

## 2022-09-05 ENCOUNTER — Other Ambulatory Visit: Payer: Self-pay

## 2022-09-05 ENCOUNTER — Ambulatory Visit: Payer: 59 | Admitting: Infectious Disease

## 2022-09-05 ENCOUNTER — Encounter: Payer: Self-pay | Admitting: Infectious Disease

## 2022-09-05 VITALS — BP 126/74 | HR 97 | Temp 98.6°F | Ht 62.0 in | Wt 104.0 lb

## 2022-09-05 DIAGNOSIS — C50212 Malignant neoplasm of upper-inner quadrant of left female breast: Secondary | ICD-10-CM

## 2022-09-05 DIAGNOSIS — B449 Aspergillosis, unspecified: Secondary | ICD-10-CM | POA: Diagnosis not present

## 2022-09-05 DIAGNOSIS — B479 Mycetoma, unspecified: Secondary | ICD-10-CM

## 2022-09-05 DIAGNOSIS — Z171 Estrogen receptor negative status [ER-]: Secondary | ICD-10-CM

## 2022-09-05 DIAGNOSIS — B441 Other pulmonary aspergillosis: Secondary | ICD-10-CM

## 2022-09-05 DIAGNOSIS — F19982 Other psychoactive substance use, unspecified with psychoactive substance-induced sleep disorder: Secondary | ICD-10-CM

## 2022-09-05 MED ORDER — VORICONAZOLE 200 MG PO TABS: 200.0000 mg | ORAL_TABLET | Freq: Two times a day (BID) | ORAL | 11 refills | Status: DC

## 2022-09-05 NOTE — Progress Notes (Signed)
Subjective:  Chief complaint: follow-up for mycetoma   Patient ID: Melissa [name], female    DOB: [birth year], 57 y.o.   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  Along the way she was diagnosed with breast cancer 2013 underwent lumpectomy and chemotherapy.  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  She is following with Dr. Lindi Adie with oncology.  She is subsequent been found to have other metastases including to her sternum and liver.  She developed cavitary lung infection and underwent bronchoscopy which revealed Aspergillus fumigatus.  She was on Cresemba since then.  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  She also has some spiculated lesions in the posterior left lobe lobe of her lung.  Radiology is concerned that the apparent mycetoma is due to Aspergillus or another mold.  There was similar concerned that the spiculated areas could represent Aspergillus though we have no culture data to guide [name].  She continued on Cresemba at that point  She then had bronchoscopy with BAL.  Fungal cultures from the bronchoscopy have been negative though the patient was on Cresemba at the time.  There is 1 AFB culture that is positive but not for tuberculosis.  Since then apparently (and I had to go to [employer] on web (not available on epic) they stated that Mycobacteria could NOT grow on subculture so no ID  In the interim the patient's insurance had denied Cresemba and she has been changed to voriconazole.  Unfortunately she has been found  to have metastases of her breast cancer to the brain.  She  underwent stereotactic radiation to these lesions by Dr. London Pepper  Her chemotherapeutic regimen previously included Tukysa (tucatinib) which can elevate voriconazole levels and potentially cause voriconazole toxicity including QT prolongation.  We checjked her voriconazole levels and they have been in the therapeutic range recently  EKG showed:  her EKG showed a QTC of 448 with a QTC of 465 ms last EKG that I could find in the computer that I could actually read was in 2019 in which she had a QTC of 442.   She did tell us that she is coughing less since being on voriconazole.   .  She had MRI of the brain done this February 2022 showed decrease in size of 3 lesions but multiple new lesions.  Her regimen was Xeloda Herceptin and Tucatinib  I last saw her she was developing a rash on her hands which worsened as well as on her lips.  Ultimately her oncologist Dr. Lindi Adie stopped her Xeloda and Tucatinib and she is on Herceptin alone.   In the interim she then had CT chest abdomen pelvis.  From an infectious disease standpoint there is increase in her cavitation which is not altogether surprising and there were areas of increased nodularity and specially in the left lower lung.  She was bothered by this and sought second opinion at [facility] and see by Dr. Gaylyn Lambert who carefully reviewed her case and felt that there was no need for further  aggressive inventions to work this up nor need to change antifungal therapy.  She also had an MRI of the brain that showed new lesions.  She was seen by Stereotactic Radiosurgery to 3 lesions on March 17 2021.  She started new chemotherapy in the form of neratinib whose drug concentrations are increased by voriconazole.  However Dr Lindi Adie started at a lower dose which they have tried to escalate.  She told me she took 3 pills without difficulty but when she went to 5  pills she had severe diarrhea and had to be given IV fluids for rehydration.    She is now off of neratinib but still on herceptin  He did again have stereotactic surgery for brain metastasis with Dr. Reatha Armour in December 2000 2022  She had a CT of the chest performed in January 2023:   This did not show PE as it was a CT angiogram did show increased opacity in left lung base with some minimal effusion with multiple serpiginous cystic lesions that were stable.  Also extensive emphysematous changes seen.   Shortly after getting her CT scan she developed neck pain chest pain which she seemed to be pleurisy to her and was ultimately mid to the hospital and treated for pneumonia with IV antibiotics followed by doxycycline and cefdinir.  She does not tolerate these antibiotics particularly doxycycline.  She saw my partner Dr. West Bali in clinic while she is off antibiotics and she gave  Augmentin to finish a course of antibiotics.  Melissa [name] has undergone repeat SRS to brain metastases in March of 2023 and remains on herceptin alone.  Her coughing had improved dramatically since I last saw her she says she is smoking less than half a pack of cigarettes sometimes only 10/day now.  She is taking her voriconazole religiously.  Her husband  also had quit smoking altogether.  Melissa [name] is being followed by Dr. Lindi Adie and is on herceptin.   She iwas also taking fenbendazole (antiheminthic for canines) and multiple other suppllements in hopes it will help her vs her cancer.    CT chest abdomen pelvis performed in December 2023 showed the following:  CT CHEST IMPRESSION   1.  No acute process or evidence of metastatic disease in the chest. 2. Left upper lobectomy. Primarily similar appearance of left lower lobe areas of presumed marked cystic bronchiectasis. Areas of nodular consolidation, some of which are minimally progressive, are consistent with acute on chronic atypical infection. 3. Aortic  atherosclerosis (ICD10-I70.0) and emphysema (ICD10-J43.9).   CT ABDOMEN AND PELVIS IMPRESSION   1. Further decrease in size of a right hepatic lobe treated metastasis. 2. No new or progressive disease.   MRI brain in January showed:   IMPRESSION: 1. Enhancing lesions within the anterior right temporal lobe, left parietooccipital lobes, superior cerebellar vermis and within the pons have increased in size since the prior brain MRI of 04/13/2022, as described. Edema surrounding the lesions within the right temporal lobe and left parietooccipital lobes has progressed. Mass effect related to the right temporal lobe lesion has also progressed, now with 4 mm leftward midline shift. Additionally, the medial right temporal lobe now exerts some mass effect upon the right cerebral peduncle. 2. The remaining lesions within the supratentorial and infratentorial brain have not significantly changed. No new intracranial metastasis is identified.   She continued on every 3-week Herceptin  Dr. Mickeal Skinner started her on steroids and an MRI of the brain is being planned for first week of March.  Melissa [name] has found her quite "wired while on the steroids and not been able to tolerate the higher dose that she was on.  She is been quite hungry as well and ingesting ice cream late at night which she thinks is causing her to cough more during the day.  I did note the drug drug interaction between voriconazole and dexamethasone with voriconazole and increasing levels of dexamethasone.  I would presume that this drug interactions being taken to account but would CC her oncology team about this.     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   Asthma    Breast cancer ([place])    Bronchiectasis ([place])    Bronchiolitis    Cancer ([place])    breast cancer 2014   Cancer, metastatic to liver ([place])    [id number]   Complication of anesthesia    bp  dropped + desat    COPD (chronic obstructive pulmonary disease) ([facility])    Dyspnea    DOE   GERD (gastroesophageal reflux disease)    H/O coccidioidomycosis    was reason for lung lobectomy   Headache(784.0)    due to eye strain or not eating   History of anemia    no current problem   History of asthma    as a child   History of breast cancer 2014   left   History of chemotherapy    finished 07/17/2013   History of hiatal hernia    AGE 42   Hx of radiation therapy 03/25/13-05/06/13   left breast 5000 cGy/25 sessions, left breast boost 1000 cGy/5 sessions   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   Pneumonia    LAST FLARE UP 01/2018   Rash 02/22/2021   Runny nose 07/30/2013   clear drainage   Smoker 05/24/2021   Wears dentures    upper    Past Surgical History:  Procedure Laterality Date   APPLICATION OF CRANIAL NAVIGATION N/A 08/04/2017   Procedure: APPLICATION OF CRANIAL NAVIGATION;  Surgeon: [name], [name], MD;  Location: [place];  Service: Neurosurgery;  Laterality: N/A;   AXILLARY LYMPH NODE DISSECTION Left 02/08/2013   Procedure: LEFT AXILLARY DISSECTION;  Surgeon: Edward Jolly, MD;  Location: [place];  Service: General;  Laterality: Left;   BREAST CYST EXCISION Right 2006   BREAST LUMPECTOMY Left 2014   BREAST LUMPECTOMY WITH NEEDLE LOCALIZATION AND AXILLARY SENTINEL LYMPH NODE BX Left 12/31/2012   Procedure: NEEDLE LOCALIZATION LEFT BREAST LUMPECTOMY AND LEFT AXILLARY SENTENIAL LYMPH NODE BX;  Surgeon: Edward Jolly, MD;  Location: [place];  Service: General;  Laterality: Left;   BRONCHIAL BRUSHINGS  03/17/2020   Procedure: BRONCHIAL BRUSHINGS;  Surgeon: Garner Nash, DO;  Location: [facility];  Service: Pulmonary;;   BRONCHIAL WASHINGS  03/17/2020   Procedure: BRONCHIAL WASHINGS;  Surgeon: Garner Nash, DO;  Location: [place];  Service: Pulmonary;;   CESAREAN SECTION  1995/1996   CRANIOTOMY Right 08/04/2017   Procedure: Right  Frontal craniotomy for resection of tumor with stereotactic navigation;  Surgeon: [name], [name], MD;  Location: [place];  Service: Neurosurgery;  Laterality: Right;  Right Frontal craniotomy for resection of tumor with stereotactic navigation   IR CV LINE INJECTION  02/19/2021   IR CV LINE INJECTION  11/12/2021   IR IMAGING GUIDED PORT INSERTION  05/31/2019   LUNG LOBECTOMY Left 05/1996   upper lobe -  due to [facility] Fever   PORT-A-CATH REMOVAL Right 08/02/2013   Procedure: REMOVAL PORT-A-CATH;  Surgeon: Edward Jolly, MD;  Location: [place];  Service: General;  Laterality: Right;   PORTACATH PLACEMENT  07/02/2012   Procedure: INSERTION PORT-A-CATH;  Surgeon: Edward Jolly, MD;  Location: [place];  Service: General;  Laterality: N/A;  right   VIDEO BRONCHOSCOPY N/A 03/17/2020   Procedure: VIDEO BRONCHOSCOPY;  Surgeon: Garner Nash, DO;  Location: [place];  Service: Pulmonary;  Laterality: N/A;   VIDEO BRONCHOSCOPY WITH ENDOBRONCHIAL ULTRASOUND N/A 05/23/2018   Procedure: VIDEO BRONCHOSCOPY WITH ENDOBRONCHIAL ULTRASOUND;  Surgeon: Garner Nash, DO;  Location: [facility];  Service: Thoracic;  Laterality: N/A;    Family History  Problem Relation Age of Onset   Emphysema Mother        was a smoker   Heart disease Mother    Melanoma Mother        dx in her 14s   Breast cancer Mother 54   Asthma Brother    Breast cancer Cousin        mother's maternal cousin; dx in her 49s      Social History   Socioeconomic History   Marital status: Married    Spouse name: Not on file   Number of children: 2   Years of education: Not on file   Highest education level: Not on file  Occupational History   Occupation: Secondary school teacher  Tobacco Use   Smoking status: Every Day    Packs/day: 0.30    Years: 38.00    Total pack years: 11.40    Types: Cigarettes   Smokeless tobacco: Never   Tobacco comments:    10 cigarettes smoked daily 10/19/20  ARJ   Vaping Use   Vaping Use: Never used  Substance and Sexual Activity   Alcohol use: No   Drug use: No   Sexual activity: Not Currently  Other Topics Concern   Not on file  Social History Narrative   Not on file   Social Determinants of Health   Financial Resource Strain: Not on file  Food Insecurity: Not on file  Transportation Needs: Not on file  Physical Activity: Not on file  Stress: Not on file  Social Connections: Not on file    Allergies  Allergen Reactions   Aspirin Anaphylaxis and Shortness Of Breath    THROAT CLOSES   Protonix [Pantoprazole] Nausea Only and Other (See Comments)    Also caused a "film in the mouth" and caused chest pressure   Doxycycline Other (See Comments)    Hallucinations, headaches   Promethazine-Codeine Cough    Worsening cough and insomnia    Sulfamethoxazole-Trimethoprim Other (See Comments)   Iodinated Contrast Media Rash    Patient allergic to contrast used in radiation oncology for CT simulation      Current Outpatient Medications:    acetaminophen (TYLENOL) 325 MG tablet, Take 2 tablets (650 mg total) by mouth every 6 (six) hours as needed for mild pain (or Fever >/= 101)., Disp: 12 tablet, Rfl: 0   albuterol (VENTOLIN HFA) 108 (90 Base) MCG/ACT inhaler, Inhale 2 puffs into the lungs every 6 (six) hours as needed for wheezing or shortness of breath (cough)., Disp: 18 g, Rfl: 5   amitriptyline (ELAVIL) 50 MG tablet, Take 1 tablet (50 mg total) by mouth at bedtime., Disp: 60 tablet, Rfl: 1   chlorpheniramine-HYDROcodone (TUSSIONEX) 10-8 MG/5ML, Take 5 mLs by mouth 2 times  daily., Disp: 115 mL, Rfl: 0   cholecalciferol (VITAMIN D3) 25 MCG (1000 UNIT) tablet, Take 1,000 Units by mouth daily., Disp: , Rfl:    dexamethasone (DECADRON) 2 MG tablet, TAKE 1 TABLET(2 MG) BY MOUTH TWICE DAILY WITH A MEAL, Disp: 60 tablet, Rfl: 0   Fluticasone-Salmeterol,sensor, (AIRDUO DIGIHALER) 113-14 MCG/ACT AEPB, Inhale 1 puff into the lungs 2 (two)  times daily., Disp: 1 each, Rfl: 4   lidocaine-prilocaine (EMLA) cream, APPLY EXTERNALLY TO THE AFFECTED AREA 1 TIME, Disp: 30 g, Rfl: 3   Multiple Vitamins-Minerals (EMERGEN-C VITAMIN C PO), Take by mouth., Disp: , Rfl:    pentoxifylline (TRENTAL) 400 MG CR tablet, Take 1 tablet (400 mg total) by mouth 3 (three) times daily with meals., Disp: 90 tablet, Rfl: 1   predniSONE (DELTASONE) 50 MG tablet, TAKE 1 TABLET BY MOUTH 13 HOURS AND 7 HOURS AND 1 HOUR PRIOR TO CT SCAN (Patient not taking: Reported on 08/02/2022), Disp: 3 tablet, Rfl: 0   SYMBICORT 160-4.5 MCG/ACT inhaler, Inhale 2 puffs into the lungs 2 (two) times daily., Disp: , Rfl:    venlafaxine XR (EFFEXOR-XR) 37.5 MG 24 hr capsule, Take 37.5 mg by mouth daily., Disp: , Rfl:    Vitamin E 200 units TABS, Take by mouth., Disp: , Rfl:    voriconazole (VFEND) 200 MG tablet, Take 1 tablet (200 mg total) by mouth 2 (two) times daily., Disp: 60 tablet, Rfl: 11 No current facility-administered medications for this visit.  Facility-Administered Medications Ordered in Other Visits:    alteplase (CATHFLO ACTIVASE) injection 2 mg, 2 mg, Intracatheter, Once PRN, Nicholas Lose, MD   sodium chloride flush (NS) 0.9 % injection 10 mL, 10 mL, Intracatheter, PRN, Nicholas Lose, MD, 10 mL at 07/06/21 1428   Review of Systems  Constitutional:  Positive for appetite change. Negative for activity change, chills, diaphoresis, fatigue, fever and unexpected weight change.  HENT:  Negative for congestion, rhinorrhea, sinus pressure, sneezing, sore throat and trouble swallowing.   Eyes:  Negative for photophobia and visual disturbance.  Respiratory:  Positive for cough and shortness of breath. Negative for chest tightness, wheezing and stridor.   Cardiovascular:  Negative for chest pain, palpitations and leg swelling.  Gastrointestinal:  Negative for abdominal distention, abdominal pain, anal bleeding, blood in stool, constipation, diarrhea, nausea and vomiting.   Genitourinary:  Negative for difficulty urinating, dysuria, flank pain and hematuria.  Musculoskeletal:  Negative for arthralgias, back pain, gait problem, joint swelling and myalgias.  Skin:  Negative for color change, pallor, rash and wound.  Neurological:  Negative for dizziness, tremors, weakness and light-headedness.  Hematological:  Negative for adenopathy. Does not bruise/bleed easily.  Psychiatric/Behavioral:  Positive for sleep disturbance. Negative for agitation, behavioral problems, confusion, decreased concentration and dysphoric mood.        Objective:   Physical Exam Constitutional:      General: She is not in acute distress.    Appearance: Normal appearance. She is well-developed. She is not ill-appearing or diaphoretic.  HENT:     Head: Normocephalic and atraumatic.     Right Ear: Hearing and external ear normal.     Left Ear: Hearing and external ear normal.     Nose: No nasal deformity or rhinorrhea.  Eyes:     General: No scleral icterus.    Conjunctiva/sclera: Conjunctivae normal.     Right eye: Right conjunctiva is not injected.     Left eye: Left conjunctiva is not injected.     Pupils: Pupils are  equal, round, and reactive to light.  Neck:     Vascular: No JVD.  Cardiovascular:     Rate and Rhythm: Normal rate and regular rhythm.     Heart sounds: Normal heart sounds, S1 normal and S2 normal. No murmur heard.    No friction rub.  Pulmonary:     Effort: Prolonged expiration present.     Breath sounds: Wheezing present. No rales.  Abdominal:     General: Bowel sounds are normal. There is no distension.     Palpations: Abdomen is soft.     Tenderness: There is no abdominal tenderness.  Musculoskeletal:        General: Normal range of motion.     Right shoulder: Normal.     Left shoulder: Normal.     Cervical back: Normal range of motion and neck supple.     Right hip: Normal.     Left hip: Normal.     Right knee: Normal.     Left knee: Normal.   Lymphadenopathy:     Head:     Right side of head: No submandibular, preauricular or posterior auricular adenopathy.     Left side of head: No submandibular, preauricular or posterior auricular adenopathy.     Cervical: No cervical adenopathy.     Right cervical: No superficial or deep cervical adenopathy.    Left cervical: No superficial or deep cervical adenopathy.  Skin:    General: Skin is warm and dry.     Coloration: Skin is not pale.     Findings: No abrasion, bruising, ecchymosis, erythema, lesion or rash.     Nails: There is no clubbing.  Neurological:     General: No focal deficit present.     Mental Status: She is alert and oriented to person, place, and time.     Sensory: No sensory deficit.     Coordination: Coordination normal.     Gait: Gait normal.  Psychiatric:        Attention and Perception: She is attentive.        Mood and Affect: Mood normal.        Speech: Speech normal.        Behavior: Behavior normal. Behavior is cooperative.        Thought Content: Thought content normal.        Judgment: Judgment normal.          Assessment & Plan:  Mycetoma: Continue voriconazole her LFTs have been stable and monitored by oncology.  History of coccidiomycosis with upper lobe lobectomy: No evidence of recurrence and the [place] should be active against this dimorphic fungus  Breast cancer with metastasis to the brain:  She is having a repeat MRI and potentially PET scan followed by potential brain biopsy  Smoking: still smoking  Insomnia, side effects of decadron. Noted interaction with vfend will cc her oncologist

## 2022-09-06 ENCOUNTER — Inpatient Hospital Stay: Payer: 59

## 2022-09-06 VITALS — BP 137/83 | HR 85 | Temp 98.1°F | Resp 18 | Wt 102.8 lb

## 2022-09-06 DIAGNOSIS — Z171 Estrogen receptor negative status [ER-]: Secondary | ICD-10-CM

## 2022-09-06 DIAGNOSIS — Z5112 Encounter for antineoplastic immunotherapy: Secondary | ICD-10-CM | POA: Diagnosis not present

## 2022-09-06 MED ORDER — DIPHENHYDRAMINE HCL 25 MG PO CAPS
25.0000 mg | ORAL_CAPSULE | Freq: Once | ORAL | Status: AC
  Administered 2022-09-06: 25 mg via ORAL
  Filled 2022-09-06: qty 1

## 2022-09-06 MED ORDER — TRASTUZUMAB-ANNS CHEMO 150 MG IV SOLR
6.0000 mg/kg | Freq: Once | INTRAVENOUS | Status: AC
  Administered 2022-09-06: 300 mg via INTRAVENOUS
  Filled 2022-09-06: qty 14.29

## 2022-09-06 MED ORDER — HEPARIN SOD (PORK) LOCK FLUSH 100 UNIT/ML IV SOLN
500.0000 [IU] | Freq: Once | INTRAVENOUS | Status: AC | PRN
  Administered 2022-09-06: 500 [IU]

## 2022-09-06 MED ORDER — ACETAMINOPHEN 325 MG PO TABS
650.0000 mg | ORAL_TABLET | Freq: Once | ORAL | Status: AC
  Administered 2022-09-06: 650 mg via ORAL
  Filled 2022-09-06: qty 2

## 2022-09-06 MED ORDER — SODIUM CHLORIDE 0.9 % IV SOLN: Freq: Once | INTRAVENOUS | Status: AC

## 2022-09-06 MED ORDER — SODIUM CHLORIDE 0.9% FLUSH
10.0000 mL | INTRAVENOUS | Status: DC | PRN
  Administered 2022-09-06: 10 mL

## 2022-09-06 NOTE — Patient Instructions (Signed)
Melissa [name]  Discharge Instructions: Thank you for choosing [facility] to provide your oncology and hematology care.   If you have a lab appointment with the [facility], please go directly to the [facility] and check in at the registration area.   Wear comfortable clothing and clothing appropriate for easy access to any Portacath or PICC line.   We strive to give you quality time with your provider. You may need to reschedule your appointment if you arrive late (15 or more minutes).  Arriving late affects you and other patients whose appointments are after yours.  Also, if you miss three or more appointments without notifying the office, you may be dismissed from the clinic at the provider's discretion.      For prescription refill requests, have your pharmacy contact our office and allow 72 hours for refills to be completed.    Today you received the following chemotherapy and/or immunotherapy agents: Kanjinti.      To help prevent nausea and vomiting after your treatment, we encourage you to take your nausea medication as directed.  BELOW ARE SYMPTOMS THAT SHOULD BE REPORTED IMMEDIATELY: *FEVER GREATER THAN 100.4 F (38 C) OR HIGHER *CHILLS OR SWEATING *NAUSEA AND VOMITING THAT IS NOT CONTROLLED WITH YOUR NAUSEA MEDICATION *UNUSUAL SHORTNESS OF BREATH *UNUSUAL BRUISING OR BLEEDING *URINARY PROBLEMS (pain or burning when urinating, or frequent urination) *BOWEL PROBLEMS (unusual diarrhea, constipation, pain near the anus) TENDERNESS IN MOUTH AND THROAT WITH OR WITHOUT PRESENCE OF ULCERS (sore throat, sores in mouth, or a toothache) UNUSUAL RASH, SWELLING OR PAIN  UNUSUAL VAGINAL DISCHARGE OR ITCHING   Items with * indicate a potential emergency and should be followed up as soon as possible or go to the Emergency Department if any problems should occur.  Please show the CHEMOTHERAPY ALERT CARD or IMMUNOTHERAPY ALERT CARD at  check-in to the Emergency Department and triage nurse.  Should you have questions after your visit or need to cancel or reschedule your appointment, please contact [facility]  Dept: [phone]  and follow the prompts.  Office hours are 8:00 a.m. to 4:30 p.m. Monday - Friday. Please note that voicemails left after 4:00 p.m. may not be returned until the following business day.  We are closed weekends and major holidays. You have access to a nurse at all times for urgent questions. Please call the main number to the clinic Dept: [phone] and follow the prompts.   For any non-urgent questions, you may also contact your provider using MyChart. We now offer e-Visits for anyone 65 and older to request care online for non-urgent symptoms. For details visit [email or web address].   Also download the MyChart app! Go to the app store, search "MyChart", open the app, select [facility], and log in with your MyChart username and password.

## 2022-09-07 ENCOUNTER — Encounter: Payer: Self-pay | Admitting: Internal Medicine

## 2022-09-14 ENCOUNTER — Ambulatory Visit
Admission: RE | Admit: 2022-09-14 | Discharge: 2022-09-14 | Disposition: A | Discharge status: Home / Self Care | Payer: 59 | Source: Ambulatory Visit | Attending: Internal Medicine | Admitting: Internal Medicine

## 2022-09-14 ENCOUNTER — Encounter: Payer: Self-pay | Admitting: Hematology and Oncology

## 2022-09-14 DIAGNOSIS — C7931 Secondary malignant neoplasm of brain: Secondary | ICD-10-CM

## 2022-09-14 MED ORDER — HEPARIN SOD (PORK) LOCK FLUSH 100 UNIT/ML IV SOLN
500.0000 [IU] | Freq: Once | INTRAVENOUS | Status: AC
  Administered 2022-09-14: 500 [IU] via INTRAVENOUS

## 2022-09-14 MED ORDER — GADOPICLENOL 0.5 MMOL/ML IV SOLN
5.0000 mL | Freq: Once | INTRAVENOUS | Status: AC | PRN
  Administered 2022-09-14: 5 mL via INTRAVENOUS

## 2022-09-14 MED ORDER — SODIUM CHLORIDE 0.9% FLUSH
10.0000 mL | INTRAVENOUS | Status: DC | PRN
  Administered 2022-09-14: 10 mL via INTRAVENOUS

## 2022-09-19 ENCOUNTER — Other Ambulatory Visit: Payer: Self-pay | Admitting: Hematology and Oncology

## 2022-09-19 ENCOUNTER — Inpatient Hospital Stay: Payer: 59

## 2022-09-19 ENCOUNTER — Inpatient Hospital Stay: Payer: 59 | Attending: Medical | Admitting: Internal Medicine

## 2022-09-19 VITALS — BP 129/76 | HR 82 | Temp 97.5°F | Resp 16 | Wt 105.1 lb

## 2022-09-19 DIAGNOSIS — Z5112 Encounter for antineoplastic immunotherapy: Secondary | ICD-10-CM | POA: Diagnosis present

## 2022-09-19 DIAGNOSIS — C50912 Malignant neoplasm of unspecified site of left female breast: Secondary | ICD-10-CM

## 2022-09-19 DIAGNOSIS — Z170 Estrogen receptor positive status [ER+]: Secondary | ICD-10-CM | POA: Diagnosis not present

## 2022-09-19 DIAGNOSIS — C50212 Malignant neoplasm of upper-inner quadrant of left female breast: Secondary | ICD-10-CM | POA: Diagnosis present

## 2022-09-19 DIAGNOSIS — Z171 Estrogen receptor negative status [ER-]: Secondary | ICD-10-CM

## 2022-09-19 DIAGNOSIS — C7931 Secondary malignant neoplasm of brain: Secondary | ICD-10-CM | POA: Diagnosis not present

## 2022-09-19 NOTE — Progress Notes (Signed)
[place] [facility] [place], [place] [address] [phone]   Interval Evaluation  Date of Service: 09/19/22 Patient Name: Melissa [name] Patient MRN: [id number] Patient DOB: [birth year] Provider: Ventura Sellers, MD  Identifying Statement:  Melissa [name] is a 57 y.o. 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 Ki-67 was 70% HER-2 was amplified with a ratio 2.91   07/12/2012 - 07/17/2013 Neo-Adjuvant Chemotherapy   TCH 6 followed by Herceptin maintenance   12/11/2012 Surgery   Left breast lumpectomy: 1.8 cm tumor 1 positive sentinel node, axillary lymph node dissection 02/08/2013 showed 0/13 lymph nodes   03/25/2013 - 05/06/2013 Radiation Therapy   Adjuvant radiation therapy   06/05/2013 - 07/20/2017 Anti-estrogen oral therapy   Tamoxifen 20 mg daily   07/27/2017 Relapse/Recurrence   MRI Brain: 3.4 x 2.9 x 2.9 cm RIGHT frontal lobe mass with imaging characteristics of solitary metastasis. Extensive vasogenic edema resulting in 9 mm RIGHT to LEFT midline shift. Equivocal very early LEFT ventricle entrapment.    08/04/2017 Surgery   Rt frontal brain resection: Poorly differentiated tumor IHC suggests breast primary ER and PR Positive   08/25/2017 - 09/04/2017 Radiation Therapy   Stereotactic radiation   09/18/2017 -  Anti-estrogen oral therapy   Lapatinib with letrozole   12/18/2017 - 12/19/2017 Radiation Therapy   New right parietal lobe metastases status post [facility]   08/27/2018 - 08/27/2020 Radiation Therapy   SRS to new brain metastases   05/09/2019 Relapse/Recurrence   Interval increase in size  of the enhancing nodular left internal mammary soft tissue 2.8 cm.  Redemonstrated enlarged supraclavicular, lower cervical and lower posterior cervical nodes unchanged.  Interval increase in the bony erosion of the posterior and lateral left third rib, increasing soft tissue lesion eroding the left sternal body 3.1 cm was 2.5 cm.  Bronchiectatic changes   06/19/2019 - 05/05/2020 Chemotherapy   ado-trastuzumab emtansine (KADCYLA)     06/15/2020 -  Chemotherapy   Xeloda, Tucatinib, Herceptin    04/12/2022 - 09/06/2022 Chemotherapy   Patient is on Treatment Plan : BREAST Trastuzumab IV (8/6) or SQ (600) D1 q21d     09/27/2022 -  Chemotherapy   Patient is on Treatment Plan : BREAST MAINTENANCE Trastuzumab IV (6) or SQ (600) D1 q21d x 13 cycles     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[place], Brain Mets 2019  06/10/2019 Initial Diagnosis   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([place])   06/19/2019 - 05/05/2020 Chemotherapy   ado-trastuzumab emtansine (KADCYLA)     09/27/2022 -  Chemotherapy   Patient is on Treatment Plan : BREAST MAINTENANCE Trastuzumab IV (6) or SQ (600) D1 q21d x 13 cycles     Port-A-Cath in place   CNS Oncologic History 10/12/21: SRS to pons 16 Gy Tammi Klippel) 06/29/21: SRS to 2x L occipital 03/17/21: SRS x3 08/27/20: SRS x5 05/13/20: SRS x4  03/25/19: SRS right frontal 5 mm 09/04/18: SRS x2 right frontal 12/18/17: SRS R parietal 09/04/17: Post op SRS R frontal, 5 fxs 08/04/17: Craniotomy, resection R frontal met ([name])  Interval History: Melissa [name] presents today for follow  up after recent MRI brain.  She describes overall improvement in burden of, had to discontinue the Elavil because of poor tolerance.  Decadron is down to '2mg'$  daily after side effects were described on 08/22/22.  Her right sided visual symptoms have not recurred.  Otherwise no new or progressive changes.  H+P (08/02/22) Patient presents today for headache evaluation.  She  describes pain "along the temples" mild to moderate, every day for the past month.  Prior to this she had very sporadic migraine type headaches, which these differ from.  No associated neurologic deficits.  She does dose codeine based cough syrup every night (for the past year), and has chronic sleep impairment.  Decadron '2mg'$  twice per day, started last week, has not helped any symptoms.  Also describes some "shadowy visual loss" on the right side, at times.  Continues on herceptin with Dr. Lindi Adie.  Medications: Current Outpatient Medications on File Prior to Visit  Medication Sig Dispense Refill   acetaminophen (TYLENOL) 325 MG tablet Take 2 tablets (650 mg total) by mouth every 6 (six) hours as needed for mild pain (or Fever >/= 101). 12 tablet 0   albuterol (VENTOLIN HFA) 108 (90 Base) MCG/ACT inhaler Inhale 2 puffs into the lungs every 6 (six) hours as needed for wheezing or shortness of breath (cough). 18 g 5   cholecalciferol (VITAMIN D3) 25 MCG (1000 UNIT) tablet Take 1,000 Units by mouth daily.     dexamethasone (DECADRON) 2 MG tablet TAKE 1 TABLET(2 MG) BY MOUTH TWICE DAILY WITH A MEAL 60 tablet 0   Fluticasone-Salmeterol,sensor, (AIRDUO DIGIHALER) 113-14 MCG/ACT AEPB Inhale 1 puff into the lungs 2 (two) times daily. 1 each 4   lidocaine-prilocaine (EMLA) cream APPLY EXTERNALLY TO THE AFFECTED AREA 1 TIME 30 g 3   Multiple Vitamins-Minerals (EMERGEN-C VITAMIN C PO) Take by mouth.     SYMBICORT 160-4.5 MCG/ACT inhaler Inhale 2 puffs into the lungs 2 (two) times daily.     venlafaxine XR (EFFEXOR-XR) 37.5 MG 24 hr capsule Take 37.5 mg by mouth daily.     Vitamin E 200 units TABS Take by mouth.     voriconazole (VFEND) 200 MG tablet Take 1 tablet (200 mg total) by mouth 2 (two) times daily. 60 tablet 11   predniSONE (DELTASONE) 50 MG tablet TAKE 1 TABLET BY MOUTH 13 HOURS AND 7 HOURS AND 1 HOUR PRIOR TO CT SCAN (Patient not taking: Reported on 08/02/2022) 3 tablet 0   Current  Facility-Administered Medications on File Prior to Visit  Medication Dose Route Frequency Provider Last Rate Last Admin   alteplase (CATHFLO ACTIVASE) injection 2 mg  2 mg Intracatheter Once PRN Nicholas Lose, MD       sodium chloride flush (NS) 0.9 % injection 10 mL  10 mL Intracatheter PRN Nicholas Lose, MD   10 mL at 07/06/21 1428    Allergies:  Allergies  Allergen Reactions   Aspirin Anaphylaxis and Shortness Of Breath    THROAT CLOSES   Protonix [Pantoprazole] Nausea Only and Other (See Comments)    Also caused a "film in the mouth" and caused chest pressure   Doxycycline Other (See Comments)    Hallucinations, headaches   Promethazine-Codeine Cough    Worsening cough and insomnia    Sulfamethoxazole-Trimethoprim Other (See Comments)   Iodinated Contrast Media Rash    Patient allergic to contrast used in radiation oncology for CT simulation    Past Medical History:  Past Medical History:  Diagnosis Date   Anemia  Arthritis    knees and hips   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[place]) 02/26/2020   Asthma    Breast cancer ([place])    Bronchiectasis ([place])    Bronchiolitis    Cancer ([place])    breast cancer 2014   Cancer, metastatic to liver ([place])    [id number]   Complication of anesthesia    bp dropped + desat    COPD (chronic obstructive pulmonary disease) (HCC)    Dyspnea    DOE   GERD (gastroesophageal reflux disease)    H/O coccidioidomycosis    was reason for lung lobectomy   Headache(784.0)    due to eye strain or not eating   History of anemia    no current problem   History of asthma    as a child   History of breast cancer 2014   left   History of chemotherapy    finished 07/17/2013   History of hiatal hernia    AGE 84   Hx of radiation therapy 03/25/13-05/06/13   left breast 5000 cGy/25 sessions, left breast boost 1000 cGy/5 sessions   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   Pneumonia    LAST FLARE UP 01/2018   Rash 02/22/2021   Runny nose 07/30/2013   clear drainage   Smoker 05/24/2021   Wears  dentures    upper   Past Surgical History:  Past Surgical History:  Procedure Laterality Date   APPLICATION OF CRANIAL NAVIGATION N/A 08/04/2017   Procedure: APPLICATION OF CRANIAL NAVIGATION;  Surgeon: [name], [name], MD;  Location: [place];  Service: Neurosurgery;  Laterality: N/A;   AXILLARY LYMPH NODE DISSECTION Left 02/08/2013   Procedure: LEFT AXILLARY DISSECTION;  Surgeon: Edward Jolly, MD;  Location: [place];  Service: General;  Laterality: Left;   BREAST CYST EXCISION Right 2006   BREAST LUMPECTOMY Left 2014   BREAST LUMPECTOMY WITH NEEDLE LOCALIZATION AND AXILLARY SENTINEL LYMPH NODE BX Left 12/31/2012   Procedure: NEEDLE LOCALIZATION LEFT BREAST LUMPECTOMY AND LEFT AXILLARY SENTENIAL LYMPH NODE BX;  Surgeon: Edward Jolly, MD;  Location: [place];  Service: General;  Laterality: Left;   BRONCHIAL BRUSHINGS  03/17/2020   Procedure: BRONCHIAL BRUSHINGS;  Surgeon: Garner Nash, DO;  Location: [place] [facility];  Service: Pulmonary;;   BRONCHIAL WASHINGS  03/17/2020   Procedure: BRONCHIAL WASHINGS;  Surgeon: Garner Nash, DO;  Location: [place];  Service: Pulmonary;;   CESAREAN SECTION  1995/1996   CRANIOTOMY Right 08/04/2017   Procedure: Right Frontal craniotomy for resection of tumor with stereotactic navigation;  Surgeon: [name], [name], MD;  Location: [place];  Service: Neurosurgery;  Laterality: Right;  Right Frontal craniotomy for resection of tumor with stereotactic navigation   IR CV LINE INJECTION  02/19/2021   IR CV LINE INJECTION  11/12/2021   IR IMAGING GUIDED PORT INSERTION  05/31/2019   LUNG LOBECTOMY Left 05/1996   upper lobe - due to [facility] Fever   PORT-A-CATH REMOVAL Right 08/02/2013   Procedure: REMOVAL PORT-A-CATH;  Surgeon: Edward Jolly, MD;  Location: [place];  Service: General;  Laterality: Right;   PORTACATH PLACEMENT  07/02/2012   Procedure: INSERTION PORT-A-CATH;  Surgeon:  Edward Jolly, MD;  Location: [place];  Service: General;  Laterality: N/A;  right   VIDEO BRONCHOSCOPY N/A 03/17/2020   Procedure: VIDEO BRONCHOSCOPY;  Surgeon: Garner Nash, DO;  Location: [place] [facility];  Service: Pulmonary;  Laterality: N/A;   VIDEO BRONCHOSCOPY WITH ENDOBRONCHIAL ULTRASOUND N/A 05/23/2018  Procedure: VIDEO BRONCHOSCOPY WITH ENDOBRONCHIAL ULTRASOUND;  Surgeon: Garner Nash, DO;  Location: [place];  Service: Thoracic;  Laterality: N/A;   Social History:  Social History   Socioeconomic History   Marital status: Married    Spouse name: Not on file   Number of children: 2   Years of education: Not on file   Highest education level: Not on file  Occupational History   Occupation: Secondary school teacher  Tobacco Use   Smoking status: Every Day    Packs/day: 0.30    Years: 38.00    Total pack years: 11.40    Types: Cigarettes   Smokeless tobacco: Never   Tobacco comments:    10 cigarettes smoked daily 10/19/20 [facility]   Vaping Use   Vaping Use: Never used  Substance and Sexual Activity   Alcohol use: No   Drug use: No   Sexual activity: Not Currently  Other Topics Concern   Not on file  Social History Narrative   Not on file   Social Determinants of Health   Financial Resource Strain: Not on file  Food Insecurity: Not on file  Transportation Needs: Not on file  Physical Activity: Not on file  Stress: Not on file  Social Connections: Not on file  Intimate Partner Violence: Not At Risk (02/20/2018)   Humiliation, Afraid, Rape, and Kick questionnaire    Fear of Current or Ex-Partner: No    Emotionally Abused: No    Physically Abused: No    Sexually Abused: No   Family History:  Family History  Problem Relation Age of Onset   Emphysema Mother        was a smoker   Heart disease Mother    Melanoma Mother        dx in her 32s   Breast cancer Mother 26   Asthma Brother    Breast cancer Cousin        mother's maternal cousin; dx in  her 67s    Review of Systems: Constitutional: Doesn't report fevers, chills or abnormal weight loss Eyes: Doesn't report blurriness of vision Ears, nose, mouth, throat, and face: Doesn't report sore throat Respiratory: Doesn't report cough, dyspnea or wheezes Cardiovascular: Doesn't report palpitation, chest discomfort  Gastrointestinal:  Doesn't report nausea, constipation, diarrhea GU: Doesn't report incontinence Skin: Doesn't report skin rashes Neurological: Per HPI Musculoskeletal: Doesn't report joint pain Behavioral/Psych: Doesn't report anxiety  Physical Exam: Vitals:   09/19/22 0855  BP: 129/76  Pulse: 82  Resp: 16  Temp: (!) 97.5 F (36.4 C)  SpO2: 100%   KPS: 80. General: Alert, cooperative, pleasant, in no acute distress Head: Normal EENT: No conjunctival injection or scleral icterus.  Lungs: Resp effort normal Cardiac: Regular rate Abdomen: Non-distended abdomen Skin: No rashes cyanosis or petechiae. Extremities: No clubbing or edema  Neurologic Exam: Mental Status: Awake, alert, attentive to examiner. Oriented to self and environment. Language is fluent with intact comprehension.  Cranial Nerves: Visual acuity is grossly normal. Visual fields are full. Extra-ocular movements intact. No ptosis. Face is symmetric Motor: Tone and bulk are normal. Power is full in both arms and legs. Reflexes are symmetric, no pathologic reflexes present.  Sensory: Intact to light touch Gait: Normal.   Labs: I have reviewed the data as listed    Component Value Date/Time   NA 139 05/24/2022 1031   NA 142 08/12/2014 0956   K 4.5 05/24/2022 1031   K 4.6 08/12/2014 0956   CL 104 05/24/2022 1031  CL 100 12/20/2012 1509   CO2 31 05/24/2022 1031   CO2 26 08/12/2014 0956   GLUCOSE 56 (L) 05/24/2022 1031   GLUCOSE 94 08/12/2014 0956   GLUCOSE 98 12/20/2012 1509   BUN 11 05/24/2022 1031   BUN 10.1 08/12/2014 0956   CREATININE 0.82 05/24/2022 1031   CREATININE 0.88  03/11/2019 1057   CREATININE 0.9 08/12/2014 0956   CALCIUM 10.1 05/24/2022 1031   CALCIUM 9.3 08/12/2014 0956   PROT 8.2 (H) 05/24/2022 1031   PROT 7.1 08/12/2014 0956   ALBUMIN 4.1 05/24/2022 1031   ALBUMIN 3.8 08/12/2014 0956   AST 14 (L) 05/24/2022 1031   AST 15 08/12/2014 0956   ALT 8 05/24/2022 1031   ALT 11 08/12/2014 0956   ALKPHOS 74 05/24/2022 1031   ALKPHOS 68 08/12/2014 0956   BILITOT 0.2 (L) 05/24/2022 1031   BILITOT 0.25 08/12/2014 0956   GFRNONAA >60 05/24/2022 1031   GFRNONAA 76 03/11/2019 1057   GFRAA >60 03/24/2020 1012   GFRAA >60 02/11/2020 0836   GFRAA 88 03/11/2019 1057   Lab Results  Component Value Date   WBC 11.3 (H) 05/24/2022   NEUTROABS 7.7 05/24/2022   HGB 12.1 05/24/2022   HCT 35.7 (L) 05/24/2022   MCV 94.7 05/24/2022   PLT 406 (H) 05/24/2022    Imaging:  MR BRAIN W WO CONTRAST  Result Date: 09/14/2022 CLINICAL DATA:  Brain/CNS neoplasm.  Assess treatment response. EXAM: MRI HEAD WITHOUT AND WITH CONTRAST TECHNIQUE: Multiplanar, multiecho pulse sequences of the brain and surrounding structures were obtained without and with intravenous contrast. CONTRAST:  5 cc [employer] COMPARISON:  07/14/2022 and previous FINDINGS: Brain: Slightly less vasogenic edema in the right hemisphere with less mass effect, less flattening of the right lateral ventricle and less right-to-left midline shift, 2 mm today compared with 4 mm previously. Following annotation is a of specific lesions reference series 13, black blood postcontrast imaging. Punctate enhancement in the left cerebellum axial image 29 is stable. Small focus of enhancement in the medial left mastoid region image 31 is stable. Pontine metastatic lesion measures 5 x 8 mm image 47, stable. Superior cerebellar vermis lesion is stable or slightly smaller towards the left side of the lesion, axial image 60. Punctate left occipital lesion image 52 is stable. Complex right temporal tip lesion axial image 55 is largely  stable, but shows some grow of a posterior enhancing projection. Punctate left occipital metastasis image 65 is stable. Medial left occipital lesion continues to enlarge, image 75, maximal dimension 28 x 19 mm today compared with 19 x 19 mm previously. Inferior right frontal lesion image 82 is stable. Right parietal cortical lesion image 102 is new, measuring about 3 mm in size. Post treatment postoperative enhancement along the medial margin of the right frontal lesion axial image 108 is stable. Treated medial right frontoparietal vertex lesion axial image 136 is stable. Vascular: No abnormal vascular finding. Skull and upper cervical spine: No new finding. As previously described, small focus of enhancement in the medial left mastoid area is stable. Sinuses/Orbits: Sinuses and orbits are negative. Other: None IMPRESSION: Overall less vasogenic edema in the right hemisphere with less mass effect when compared to the previous study. New or enlarging lesions: Complex lesion at the right temporal tip axial image 55 with some growth along the posterior margin. Continued enlargement of a medial left occipital lesion image 75 with considerable growth since the previous study. New 3 mm right parietal cortical lesion axial image 102. All  other lesions are stable. Electronically Signed   By: Nelson Chimes M.D.   On: 09/14/2022 15:16    [place] Clinician Interpretation: I have personally reviewed the radiological images as listed.  My interpretation, in the context of the patient's clinical presentation, is progressive disease   Assessment/Plan 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 [facility])  Craig Guess Cordell is clinically stable today.  MRI brain demonstrates progression of enhancing mass within left occipital lobe, c/w neoplasm or radiation necrosis.  Right anterior temporal lesion is also more subtly progressive over same time period.    There is a potentially new tiny focus of enhancement in right parietal lobe, we will  perform fusion to assess if that site was previously treated.    For CNS progression, we feel she is a strong LITT candidate, particularly for the left occipital lesion.  We will reach out to Dr. Velia Meyer team for an evaluation.  They are agreeable with this plan.  If not amenable to LITT, will refer back to Dr. Reatha Armour for a biopsy.  Surgical resection would risk visual field cut.  Decadron will decrease to '1mg'$  daily if tolerated.  We appreciate the opportunity to participate in the care of CHEQUITA WARLEY.   We ask that AYELIN THORNBURY return to clinic in TBD depending on upcoming interventions, or sooner as needed.  All questions were answered. The patient knows to call the clinic with any problems, questions or concerns. No barriers to learning were detected.  The total time spent in the encounter was 40 minutes and more than 50% was on counseling and review of test results   Ventura Sellers, MD Medical Director of Neuro-Oncology [facility] [place] 09/19/22 9:01 AM

## 2022-09-20 NOTE — Progress Notes (Signed)
Patient Care Team: Charlane Ferretti, MD as PCP - General (Internal Medicine) [name], MD as Referring Physician (Emergency Medicine)  DIAGNOSIS:  Encounter Diagnoses  Name Primary?   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 Ki-67 was 70% HER-2 was amplified with a ratio 2.91   07/12/2012 - 07/17/2013 Neo-Adjuvant Chemotherapy   TCH 6 followed by Herceptin maintenance   12/11/2012 Surgery   Left breast lumpectomy: 1.8 cm tumor 1 positive sentinel node, axillary lymph node dissection 02/08/2013 showed 0/13 lymph nodes   03/25/2013 - 05/06/2013 Radiation Therapy   Adjuvant radiation therapy   06/05/2013 - 07/20/2017 Anti-estrogen oral therapy   Tamoxifen 20 mg daily   07/27/2017 Relapse/Recurrence   MRI Brain: 3.4 x 2.9 x 2.9 cm RIGHT frontal lobe mass with imaging characteristics of solitary metastasis. Extensive vasogenic edema resulting in 9 mm RIGHT to LEFT midline shift. Equivocal very early LEFT ventricle entrapment.    08/04/2017 Surgery   Rt frontal brain resection: Poorly differentiated tumor IHC suggests breast primary ER and PR Positive   08/25/2017 - 09/04/2017 Radiation Therapy   Stereotactic radiation   09/18/2017 -  Anti-estrogen oral therapy   Lapatinib with letrozole   12/18/2017 - 12/19/2017 Radiation Therapy   New right parietal lobe metastases status post [facility]   08/27/2018 - 08/27/2020 Radiation Therapy   SRS to new brain metastases   05/09/2019 Relapse/Recurrence   Interval increase in size of the enhancing nodular left  internal mammary soft tissue 2.8 cm.  Redemonstrated enlarged supraclavicular, lower cervical and lower posterior cervical nodes unchanged.  Interval increase in the bony erosion of the posterior and lateral left third rib, increasing soft tissue lesion eroding the left sternal body 3.1 cm was 2.5 cm.  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 with the above-mentioned history of metastatic breast cancer currently on treatment with Xeloda and Herceptin. She presents to the clinic today for follow-up.  She has been to [facility] neuro surgery and is going to undergo resection of the occipital lobe mass on 10/06/2022.  She is otherwise tolerating Herceptin fairly well.  ALLERGIES:  is allergic to aspirin, protonix [pantoprazole], doxycycline, promethazine-codeine, sulfamethoxazole-trimethoprim, and iodinated contrast media.  MEDICATIONS:  Current Outpatient Medications  Medication Sig Dispense Refill   acetaminophen (TYLENOL) 325 MG tablet Take 2 tablets (650 mg total) by mouth every 6 (six) hours as needed for mild pain (or Fever >/= 101). 12  tablet 0   albuterol (VENTOLIN HFA) 108 (90 Base) MCG/ACT inhaler Inhale 2 puffs into the lungs every 6 (six) hours as needed for wheezing or shortness of breath (cough). 18 g 5   cholecalciferol (VITAMIN D3) 25 MCG (1000 UNIT) tablet Take 1,000 Units by mouth daily.     dexamethasone (DECADRON) 2 MG tablet TAKE 1 TABLET(2 MG) BY MOUTH TWICE DAILY WITH A MEAL 60 tablet 0   Fluticasone-Salmeterol,sensor, (AIRDUO DIGIHALER) 113-14 MCG/ACT AEPB Inhale 1 puff into the lungs 2 (two) times daily. 1 each 4   lidocaine-prilocaine (EMLA) cream APPLY EXTERNALLY TO THE AFFECTED AREA 1 TIME 30 g 3   Multiple Vitamins-Minerals (EMERGEN-C VITAMIN C PO) Take by mouth.     predniSONE (DELTASONE) 50 MG tablet TAKE 1 TABLET BY MOUTH 13 HOURS AND 7 HOURS AND 1 HOUR PRIOR TO CT SCAN (Patient not taking: Reported on 08/02/2022) 3 tablet 0   SYMBICORT 160-4.5 MCG/ACT inhaler Inhale 2 puffs into the lungs 2 (two) times daily.     venlafaxine XR (EFFEXOR-XR) 37.5 MG 24 hr capsule Take 37.5 mg by mouth daily.     Vitamin E 200 units TABS Take by mouth.     voriconazole (VFEND) 200 MG tablet Take 1 tablet (200 mg total) by mouth 2 (two) times daily. 60 tablet 11   No current facility-administered medications for this visit.   Facility-Administered Medications Ordered in Other Visits  Medication Dose Route Frequency Provider Last Rate Last Admin   alteplase (CATHFLO ACTIVASE) injection 2 mg  2 mg Intracatheter Once PRN Nicholas Lose, MD       sodium chloride flush (NS) 0.9 % injection 10 mL  10 mL Intracatheter PRN Nicholas Lose, MD   10 mL at 07/06/21 1428    PHYSICAL EXAMINATION: ECOG PERFORMANCE STATUS: 1 - Symptomatic but completely ambulatory  There were no vitals filed for this visit. There were no vitals filed for this visit.    LABORATORY DATA:  I have reviewed the data as listed    Latest Ref Rng & Units 05/24/2022   10:31 AM 04/12/2022    8:20 AM 02/28/2022    9:32 AM  CMP  Glucose 70 - 99 mg/dL 56   96  100   BUN 6 - 20 mg/dL 11  13  12    Creatinine 0.44 - 1.00 mg/dL 0.82  0.70  0.73   Sodium 135 - 145 mmol/L 139  138  139   Potassium 3.5 - 5.1 mmol/L 4.5  4.1  4.1   Chloride 98 - 111 mmol/L 104  106  106   CO2 22 - 32 mmol/L 31  29  29    Calcium 8.9 - 10.3 mg/dL 10.1  9.4  10.0   Total Protein 6.5 - 8.1 g/dL 8.2  7.2  7.4   Total Bilirubin 0.3 - 1.2 mg/dL 0.2  0.2  0.2   Alkaline Phos 38 - 126 U/L 74  71  72   AST 15 - 41 U/L 14  16  19    ALT 0 - 44 U/L 8  13  13      Lab Results  Component Value Date   WBC 11.3 (  H) 05/24/2022   HGB 12.1 05/24/2022   HCT 35.7 (L) 05/24/2022   MCV 94.7 05/24/2022   PLT 406 (H) 05/24/2022   NEUTROABS 7.7 05/24/2022    ASSESSMENT & PLAN:  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  [place] brain: 08/25/2017-09/04/2017 2. Anti Her 2 therapy with Lapatinib started 09/17/2017-05/14/2019: Stopped for progression 3.  I discontinued tamoxifen and started her on letrozole 2.5 mg daily.  05/14/2019 stopped for progression 4.  Stereotactic radiosurgery 12/19/2017 to the new right parietal lobe metastases. 5.  Kadcyla: Received 16 cycles discontinued 05/05/2020 6.  Tucatinib Xeloda: Discontinued because of hand-foot syndrome 06/11/2020-12/15/2020 7. Herceptin/Neratinib: unable to tolerate due to diarrhea and weight loss 8.  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  She and her husband are getting therapy.   Brain MRI March 2024: Because of progression of brain metastasis she has been [facility] to [facility].  She is undergoing resection of one of the lesions in the occipital lobe on 10/06/2022.   Continue with every 3-week Herceptin next scans will be done in June. I will see the patient every 6 weeks for follow-ups.    No orders of the defined types were placed in this encounter.  The patient has a good understanding of the overall plan. she agrees with it. she will call with any problems that may develop before the next visit here. Total time spent: 30 mins including face to face time and time spent for planning, charting and co-ordination of care   Harriette Ohara, MD 09/27/22    I Gardiner Coins am acting as a Education administrator for [employer]  I have reviewed the above documentation for accuracy and completeness, and I agree with the above.

## 2022-09-26 ENCOUNTER — Other Ambulatory Visit: Payer: Self-pay | Admitting: Radiation Therapy

## 2022-09-27 ENCOUNTER — Other Ambulatory Visit: Payer: Self-pay | Admitting: *Deleted

## 2022-09-27 ENCOUNTER — Inpatient Hospital Stay: Payer: 59

## 2022-09-27 ENCOUNTER — Inpatient Hospital Stay (HOSPITAL_BASED_OUTPATIENT_CLINIC_OR_DEPARTMENT_OTHER): Payer: 59 | Admitting: Hematology and Oncology

## 2022-09-27 VITALS — BP 125/71 | HR 67 | Temp 97.8°F | Resp 18 | Ht 62.0 in | Wt 105.7 lb

## 2022-09-27 DIAGNOSIS — C7931 Secondary malignant neoplasm of brain: Secondary | ICD-10-CM | POA: Diagnosis not present

## 2022-09-27 DIAGNOSIS — C50212 Malignant neoplasm of upper-inner quadrant of left female breast: Secondary | ICD-10-CM

## 2022-09-27 DIAGNOSIS — C50912 Malignant neoplasm of unspecified site of left female breast: Secondary | ICD-10-CM

## 2022-09-27 DIAGNOSIS — I427 Cardiomyopathy due to drug and external agent: Secondary | ICD-10-CM

## 2022-09-27 DIAGNOSIS — Z171 Estrogen receptor negative status [ER-]: Secondary | ICD-10-CM

## 2022-09-27 DIAGNOSIS — Z79899 Other long term (current) drug therapy: Secondary | ICD-10-CM

## 2022-09-27 DIAGNOSIS — Z5112 Encounter for antineoplastic immunotherapy: Secondary | ICD-10-CM | POA: Diagnosis not present

## 2022-09-27 MED ORDER — HEPARIN SOD (PORK) LOCK FLUSH 100 UNIT/ML IV SOLN
500.0000 [IU] | Freq: Once | INTRAVENOUS | Status: AC | PRN
  Administered 2022-09-27: 500 [IU]

## 2022-09-27 MED ORDER — ACETAMINOPHEN 325 MG PO TABS
650.0000 mg | ORAL_TABLET | Freq: Once | ORAL | Status: AC
  Administered 2022-09-27: 650 mg via ORAL
  Filled 2022-09-27: qty 2

## 2022-09-27 MED ORDER — SODIUM CHLORIDE 0.9% FLUSH
10.0000 mL | INTRAVENOUS | Status: DC | PRN
  Administered 2022-09-27: 10 mL

## 2022-09-27 MED ORDER — SODIUM CHLORIDE 0.9 % IV SOLN: Freq: Once | INTRAVENOUS | Status: AC

## 2022-09-27 MED ORDER — TRASTUZUMAB-ANNS CHEMO 150 MG IV SOLR
6.0000 mg/kg | Freq: Once | INTRAVENOUS | Status: AC
  Administered 2022-09-27: 300 mg via INTRAVENOUS
  Filled 2022-09-27: qty 14.29

## 2022-09-27 MED ORDER — DIPHENHYDRAMINE HCL 25 MG PO CAPS
25.0000 mg | ORAL_CAPSULE | Freq: Once | ORAL | Status: AC
  Administered 2022-09-27: 25 mg via ORAL
  Filled 2022-09-27: qty 1

## 2022-09-27 NOTE — Progress Notes (Signed)
Per MD pt to receive echo q6 months.  Orders placed for next echo to be obtained 12/13/22.

## 2022-09-27 NOTE — Patient Instructions (Signed)
[facility]  Discharge Instructions: Thank you for choosing [facility] to provide your oncology and hematology care.   If you have a lab appointment with the Cancer Center, please go directly to the Cancer Center and check in at the registration area.   Wear comfortable clothing and clothing appropriate for easy access to any Portacath or PICC line.   We strive to give you quality time with your provider. You may need to reschedule your appointment if you arrive late (15 or more minutes).  Arriving late affects you and other patients whose appointments are after yours.  Also, if you miss three or more appointments without notifying the office, you may be dismissed from the clinic at the provider's discretion.      For prescription refill requests, have your pharmacy contact our office and allow 72 hours for refills to be completed.    Today you received the following chemotherapy and/or immunotherapy agents: Kanjinti.      To help prevent nausea and vomiting after your treatment, we encourage you to take your nausea medication as directed.  BELOW ARE SYMPTOMS THAT SHOULD BE REPORTED IMMEDIATELY: *FEVER GREATER THAN 100.4 F (38 C) OR HIGHER *CHILLS OR SWEATING *NAUSEA AND VOMITING THAT IS NOT CONTROLLED WITH YOUR NAUSEA MEDICATION *UNUSUAL SHORTNESS OF BREATH *UNUSUAL BRUISING OR BLEEDING *URINARY PROBLEMS (pain or burning when urinating, or frequent urination) *BOWEL PROBLEMS (unusual diarrhea, constipation, pain near the anus) TENDERNESS IN MOUTH AND THROAT WITH OR WITHOUT PRESENCE OF ULCERS (sore throat, sores in mouth, or a toothache) UNUSUAL RASH, SWELLING OR PAIN  UNUSUAL VAGINAL DISCHARGE OR ITCHING   Items with * indicate a potential emergency and should be followed up as soon as possible or go to the Emergency Department if any problems should occur.  Please show the CHEMOTHERAPY ALERT CARD or IMMUNOTHERAPY ALERT CARD at  check-in to the Emergency Department and triage nurse.  Should you have questions after your visit or need to cancel or reschedule your appointment, please contact [facility]  Dept: [phone]  and follow the prompts.  Office hours are 8:00 a.m. to 4:30 p.m. Monday - Friday. Please note that voicemails left after 4:00 p.m. may not be returned until the following business day.  We are closed weekends and major holidays. You have access to a nurse at all times for urgent questions. Please call the main number to the clinic Dept: [phone] and follow the prompts.   For any non-urgent questions, you may also contact your provider using MyChart. We now offer e-Visits for anyone 18 and older to request care online for non-urgent symptoms. For details visit [email or web address].   Also download the MyChart app! Go to the app store, search "MyChart", open the app, select [place] [facility], and log in with your MyChart username and password.   

## 2022-09-27 NOTE — Assessment & Plan Note (Signed)
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  Melissa [name] brain: 08/25/2017-09/04/2017 2. Anti Her 2 therapy with Lapatinib started 09/17/2017-05/14/2019: Stopped for progression 3.  I discontinued tamoxifen and started her on letrozole 2.5 mg daily.  05/14/2019 stopped for progression 4.  Stereotactic radiosurgery 12/19/2017 to the new right parietal lobe metastases. 5.  Kadcyla: Received 16 cycles discontinued 05/05/2020 6.  Tucatinib Xeloda: Discontinued because of hand-foot syndrome 06/11/2020-12/15/2020 7. Herceptin/Neratinib: unable to tolerate due to diarrhea and weight loss 8.  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  She and her husband are  getting therapy.   Brain MRI 07/14/2022: Enhancing lesions within the anterior right temporal lobe, left parietal to occipital lobes, superior cerebellar vermis and within the pons have increased in size.  Dr. Mickeal Skinner started her on steroids and an MRI of the brain is being planned for first week of March.  She was informed that, it is possible that she might need a PET scan for the brain and biopsy if necessary.   Continue with every 3-week Herceptin next scans will be done in June. I will see the patient every 6 weeks for follow-ups.

## 2022-09-28 ENCOUNTER — Telehealth: Payer: Self-pay | Admitting: Hematology and Oncology

## 2022-09-28 NOTE — Telephone Encounter (Signed)
Scheduled appointments per WQ. Unable to leave voicemail due to patients mailbox being full.

## 2022-10-03 ENCOUNTER — Encounter: Payer: Self-pay | Admitting: Hematology and Oncology

## 2022-10-07 ENCOUNTER — Inpatient Hospital Stay
Admission: RE | Admit: 2022-10-07 | Discharge: 2022-10-07 | Disposition: A | Discharge status: Home / Self Care | Payer: Self-pay | Source: Ambulatory Visit | Attending: Radiation Oncology | Admitting: Radiation Oncology

## 2022-10-07 ENCOUNTER — Other Ambulatory Visit: Payer: Self-pay | Admitting: Radiation Therapy

## 2022-10-07 DIAGNOSIS — C7931 Secondary malignant neoplasm of brain: Secondary | ICD-10-CM

## 2022-10-10 ENCOUNTER — Inpatient Hospital Stay: Payer: 59

## 2022-10-11 ENCOUNTER — Other Ambulatory Visit: Payer: Self-pay

## 2022-10-17 ENCOUNTER — Other Ambulatory Visit: Payer: Self-pay | Admitting: Radiation Therapy

## 2022-10-17 ENCOUNTER — Inpatient Hospital Stay: Payer: 59

## 2022-10-17 DIAGNOSIS — C7931 Secondary malignant neoplasm of brain: Secondary | ICD-10-CM

## 2022-10-17 DIAGNOSIS — Z5112 Encounter for antineoplastic immunotherapy: Secondary | ICD-10-CM | POA: Insufficient documentation

## 2022-10-17 DIAGNOSIS — C787 Secondary malignant neoplasm of liver and intrahepatic bile duct: Secondary | ICD-10-CM | POA: Insufficient documentation

## 2022-10-17 DIAGNOSIS — C50212 Malignant neoplasm of upper-inner quadrant of left female breast: Secondary | ICD-10-CM | POA: Insufficient documentation

## 2022-10-17 DIAGNOSIS — C7951 Secondary malignant neoplasm of bone: Secondary | ICD-10-CM | POA: Insufficient documentation

## 2022-10-17 DIAGNOSIS — Z170 Estrogen receptor positive status [ER+]: Secondary | ICD-10-CM | POA: Insufficient documentation

## 2022-10-17 NOTE — Progress Notes (Signed)
Nursing interview for 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.   Patient identity verified. Patient reports occasional headaches 3/10. No other issues reported at this time.  Meaningful use complete. Postmenopausal- NO pregnancy  Vitals- BP 121/71 (BP Location: Right Arm, Patient Position: Sitting, Cuff Size: Normal)   Pulse 77   Temp (!) 97.4 F (36.3 C) (Temporal)   Resp 18   Ht 5\' 2"  (1.575 m)   Wt 105 lb 6 oz (47.8 kg)   LMP 07/25/2012 Comment: pregnancy waiver form signed 01-18-2018  SpO2 100%   BMI 19.27 kg/m   This concludes the interview.   Ruel Favors, LPN

## 2022-10-18 ENCOUNTER — Encounter: Payer: Self-pay | Admitting: Radiation Oncology

## 2022-10-18 ENCOUNTER — Encounter: Payer: Self-pay | Admitting: Hematology and Oncology

## 2022-10-18 ENCOUNTER — Inpatient Hospital Stay: Payer: 59

## 2022-10-18 ENCOUNTER — Ambulatory Visit
Admission: RE | Admit: 2022-10-18 | Discharge: 2022-10-18 | Disposition: A | Discharge status: Home / Self Care | Payer: 59 | Source: Ambulatory Visit | Attending: Radiation Oncology | Admitting: Radiation Oncology

## 2022-10-18 VITALS — BP 138/78 | HR 75 | Temp 97.8°F | Resp 18 | Ht 62.0 in | Wt 103.0 lb

## 2022-10-18 VITALS — BP 121/71 | HR 77 | Temp 97.4°F | Resp 18 | Ht 62.0 in | Wt 105.4 lb

## 2022-10-18 DIAGNOSIS — C7951 Secondary malignant neoplasm of bone: Secondary | ICD-10-CM | POA: Insufficient documentation

## 2022-10-18 DIAGNOSIS — C50212 Malignant neoplasm of upper-inner quadrant of left female breast: Secondary | ICD-10-CM | POA: Insufficient documentation

## 2022-10-18 DIAGNOSIS — Z171 Estrogen receptor negative status [ER-]: Secondary | ICD-10-CM | POA: Insufficient documentation

## 2022-10-18 DIAGNOSIS — Z923 Personal history of irradiation: Secondary | ICD-10-CM | POA: Insufficient documentation

## 2022-10-18 DIAGNOSIS — Z79899 Other long term (current) drug therapy: Secondary | ICD-10-CM | POA: Insufficient documentation

## 2022-10-18 DIAGNOSIS — Z510 Encounter for antineoplastic radiation therapy: Secondary | ICD-10-CM | POA: Diagnosis present

## 2022-10-18 DIAGNOSIS — C7949 Secondary malignant neoplasm of other parts of nervous system: Secondary | ICD-10-CM

## 2022-10-18 DIAGNOSIS — Z170 Estrogen receptor positive status [ER+]: Secondary | ICD-10-CM | POA: Insufficient documentation

## 2022-10-18 DIAGNOSIS — C7931 Secondary malignant neoplasm of brain: Secondary | ICD-10-CM

## 2022-10-18 DIAGNOSIS — C50912 Malignant neoplasm of unspecified site of left female breast: Secondary | ICD-10-CM

## 2022-10-18 MED ORDER — SODIUM CHLORIDE 0.9 % IV SOLN: Freq: Once | INTRAVENOUS | Status: AC

## 2022-10-18 MED ORDER — ACETAMINOPHEN 325 MG PO TABS
650.0000 mg | ORAL_TABLET | Freq: Once | ORAL | Status: AC
  Administered 2022-10-18: 650 mg via ORAL
  Filled 2022-10-18: qty 2

## 2022-10-18 MED ORDER — SODIUM CHLORIDE 0.9% FLUSH
10.0000 mL | INTRAVENOUS | Status: DC | PRN
  Administered 2022-10-18: 10 mL

## 2022-10-18 MED ORDER — TRASTUZUMAB-ANNS CHEMO 150 MG IV SOLR
6.0000 mg/kg | Freq: Once | INTRAVENOUS | Status: AC
  Administered 2022-10-18: 300 mg via INTRAVENOUS
  Filled 2022-10-18: qty 14.29

## 2022-10-18 MED ORDER — DIPHENHYDRAMINE HCL 25 MG PO CAPS
25.0000 mg | ORAL_CAPSULE | Freq: Once | ORAL | Status: AC
  Administered 2022-10-18: 25 mg via ORAL
  Filled 2022-10-18: qty 1

## 2022-10-18 MED ORDER — HEPARIN SOD (PORK) LOCK FLUSH 100 UNIT/ML IV SOLN
500.0000 [IU] | Freq: Once | INTRAVENOUS | Status: AC | PRN
  Administered 2022-10-18: 500 [IU]

## 2022-10-18 NOTE — Patient Instructions (Signed)
[name]  Discharge Instructions: Thank you for choosing [facility] to provide your oncology and hematology care.   If you have a lab appointment with the Cancer Center, please go directly to the Cancer Center and check in at the registration area.   Wear comfortable clothing and clothing appropriate for easy access to any Portacath or PICC line.   We strive to give you quality time with your provider. You may need to reschedule your appointment if you arrive late (15 or more minutes).  Arriving late affects you and other patients whose appointments are after yours.  Also, if you miss three or more appointments without notifying the office, you may be dismissed from the clinic at the provider's discretion.      For prescription refill requests, have your pharmacy contact our office and allow 72 hours for refills to be completed.    Today you received the following chemotherapy and/or immunotherapy agents: Trastuzumab      To help prevent nausea and vomiting after your treatment, we encourage you to take your nausea medication as directed.  BELOW ARE SYMPTOMS THAT SHOULD BE REPORTED IMMEDIATELY: *FEVER GREATER THAN 100.4 F (38 C) OR HIGHER *CHILLS OR SWEATING *NAUSEA AND VOMITING THAT IS NOT CONTROLLED WITH YOUR NAUSEA MEDICATION *UNUSUAL SHORTNESS OF BREATH *UNUSUAL BRUISING OR BLEEDING *URINARY PROBLEMS (pain or burning when urinating, or frequent urination) *BOWEL PROBLEMS (unusual diarrhea, constipation, pain near the anus) TENDERNESS IN MOUTH AND THROAT WITH OR WITHOUT PRESENCE OF ULCERS (sore throat, sores in mouth, or a toothache) UNUSUAL RASH, SWELLING OR PAIN  UNUSUAL VAGINAL DISCHARGE OR ITCHING   Items with * indicate a potential emergency and should be followed up as soon as possible or go to the Emergency Department if any problems should occur.  Please show the CHEMOTHERAPY ALERT CARD or IMMUNOTHERAPY ALERT CARD at  check-in to the Emergency Department and triage nurse.  Should you have questions after your visit or need to cancel or reschedule your appointment, please contact [facility]  Dept: [phone]  and follow the prompts.  Office hours are 8:00 a.m. to 4:30 p.m. Monday - Friday. Please note that voicemails left after 4:00 p.m. may not be returned until the following business day.  We are closed weekends and major holidays. You have access to a nurse at all times for urgent questions. Please call the main number to the clinic Dept: [phone] and follow the prompts.   For any non-urgent questions, you may also contact your provider using MyChart. We now offer e-Visits for anyone 18 and older to request care online for non-urgent symptoms. For details visit [email or web address].   Also download the MyChart app! Go to the app store, search "MyChart", open the app, select [place], and log in with your MyChart username and password.  

## 2022-10-18 NOTE — Progress Notes (Signed)
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        ICD-10-CM   1. Metastasis to brain  C79.31     2. 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  C79.31     3. Secondary malignant neoplasm of brain and spinal cord  C79.31    C79.49       Interval Since Last Radiation:  11 months  10/12/21: The 3 mm target in the right pons was treated to a prescription dose of 20 G in a single fraction.  06/29/21: These two targets were treated to a prescription dose of 20 G in a single fraction.    03/17/21: These three targets were treated to a prescription dose of 20 G in a single fraction.    08/27/20: These five targets were treated to a prescription dose of 20 G in a single fraction.    05/13/20:  SRS brain to the following 4 targets treated to a prescription dose of 20 Gy in a single fraction with a single isocenter:  PTV6 Mid Cerebellum 39mm PTV7 Lt Occipital 79mm PTV8 Lt Occipital 14mm PTV9 Lt Temporal 48mm    03/25/2019:  SRS brain//PTV 5:  Right frontal 5 mm target was treated to a prescription dose of 20 Gy in a single fraction.   09/04/2018:   SRS Brain// Right Frontal, 2 targets / 20 Gy in 1 fraction PTV3: Ant Rt Frontal 24mm 20Gy PTV4: Rt Frontal resection cavity 37mm  20Gy   12/18/2017: SRS brain//PTV2: 4 mm Rt Parietal lesion treated to 20 Gy in 1 Fx   08/25/2017, 08/28/2017, 08/30/2017, 09/01/2017, 09/04/2017: PTV1: post op SRS to right frontal lobe resection cavity in 5 fxs  Narrative:  The patient returns today for follow-up following resection of a left occipital lesion, measuring 26 mm at the site of previous SRS/metastasis.  The craniotomy and resection at [facility] revealed recurrent viable  tumor rather than radiation necrosis.  She returns today to discuss adjuvant radiation to the resection site and treatment of a new 3 mm brain met in the right parietal cortex.              ALLERGIES:  is allergic to aspirin, protonix [pantoprazole], doxycycline, promethazine-codeine, sulfamethoxazole-trimethoprim, and iodinated contrast media.  Meds: Current Outpatient Medications  Medication Sig Dispense Refill   bacitracin 500 UNIT/GM ointment Apply topically.     levETIRAcetam (KEPPRA) 500 MG tablet Take by mouth.     oxyCODONE (OXY IR/ROXICODONE) 5 MG immediate release tablet Take by mouth.     senna-docusate (SENOKOT-S) 8.6-50 MG tablet Take by mouth.     acetaminophen (TYLENOL) 325 MG tablet Take 2 tablets (650 mg total) by mouth every 6 (six) hours as needed for mild pain (or Fever >/= 101). 12 tablet 0   albuterol (VENTOLIN HFA) 108 (90 Base) MCG/ACT inhaler Inhale 2 puffs into the lungs every 6 (six) hours as needed for wheezing or shortness of breath (cough). 18 g 5   Cholecalciferol 250 MCG (10000 UT) CAPS Take by mouth.     Fluticasone-Salmeterol,sensor, (AIRDUO DIGIHALER) 113-14 MCG/ACT AEPB Inhale 1 puff into the lungs 2 (two) times daily. 1 each 4  GARLIC PO Take by mouth.     lidocaine-prilocaine (EMLA) cream APPLY EXTERNALLY TO THE AFFECTED AREA 1 TIME 30 g 3   Multiple Vitamins-Minerals (EMERGEN-C VITAMIN C PO) Take by mouth.     OIL OF OREGANO PO Take 500 mg by mouth 2 (two) times daily.     predniSONE (DELTASONE) 10 MG tablet Take by mouth.     SYMBICORT 160-4.5 MCG/ACT inhaler Inhale 2 puffs into the lungs 2 (two) times daily.     venlafaxine XR (EFFEXOR-XR) 37.5 MG 24 hr capsule Take 37.5 mg by mouth daily.     Vitamin E (VITAMIN E/D-ALPHA NATURAL) 268 MG (400 UNIT) CAPS Take by mouth.     voriconazole (VFEND) 200 MG tablet Take 1 tablet (200 mg total) by mouth 2 (two) times daily. 60 tablet 11   No current facility-administered medications for this encounter.    Facility-Administered Medications Ordered in Other Encounters  Medication Dose Route Frequency Provider Last Rate Last Admin   alteplase (CATHFLO ACTIVASE) injection 2 mg  2 mg Intracatheter Once PRN Serena CroissantGudena, Vinay, MD       sodium chloride flush (NS) 0.9 % injection 10 mL  10 mL Intracatheter PRN Serena CroissantGudena, Vinay, MD   10 mL at 07/06/21 1428   sodium chloride flush (NS) 0.9 % injection 10 mL  10 mL Intracatheter PRN Serena CroissantGudena, Vinay, MD   10 mL at 10/18/22 [id number]    Physical Findings: The patient is in no acute distress. Patient is alert and oriented.  height is 5\' 2"  (1.575 m) and weight is 105 lb 6 oz (47.8 kg). Her temporal temperature is 97.4 F (36.3 C) (abnormal). Her blood pressure is 121/71 and her pulse is 77. Her respiration is 18 and oxygen saturation is 100%. .  No significant changes. Recent craniotomy incision is well intended and without any sign of infection or drainage.  I removed the 18 staples today.   Lab Findings: Lab Results  Component Value Date   WBC 11.3 (H) 05/24/2022   WBC 8.7 07/26/2021   HGB 12.1 05/24/2022   HGB 13.1 08/12/2014   HCT 35.7 (L) 05/24/2022   HCT 40.4 08/12/2014   PLT 406 (H) 05/24/2022   PLT 228 08/12/2014    Lab Results  Component Value Date   NA 139 05/24/2022   NA 142 08/12/2014   K 4.5 05/24/2022   K 4.6 08/12/2014   CHLORIDE 107 08/12/2014   CO2 31 05/24/2022   CO2 26 08/12/2014   GLUCOSE 56 (L) 05/24/2022   GLUCOSE 94 08/12/2014   GLUCOSE 98 12/20/2012   BUN 11 05/24/2022   BUN 10.1 08/12/2014   CREATININE 0.82 05/24/2022   CREATININE 0.88 03/11/2019   CREATININE 0.9 08/12/2014   BILITOT 0.2 (L) 05/24/2022   BILITOT 0.25 08/12/2014   ALKPHOS 74 05/24/2022   ALKPHOS 68 08/12/2014   AST 14 (L) 05/24/2022   AST 15 08/12/2014   ALT 8 05/24/2022   ALT 11 08/12/2014   PROT 8.2 (H) 05/24/2022   PROT 7.1 08/12/2014   ALBUMIN 4.1 05/24/2022   ALBUMIN 3.8 08/12/2014   CALCIUM 10.1 05/24/2022   CALCIUM 9.3 08/12/2014    ANIONGAP 4 (L) 05/24/2022    Radiographic Findings: No results found.  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  The craniotomy and resection at [facility] revealed recurrent viable tumor rather than radiation necrosis.  She returns today to discuss adjuvant radiation to the resection site and treatment of a new 3 mm brain met in the right parietal cortex.  At this point, the patient would potentially benefit from radiotherapy. The options include whole brain irradiation versus stereotactic radiosurgery. There are pros and cons associated with each of these potential treatment options. Whole brain radiotherapy would treat the known metastatic deposits and help provide some reduction of risk for future brain metastases. However, whole brain radiotherapy carries potential risks including hair loss, subacute somnolence, and neurocognitive changes including a possible reduction in short-term memory. Whole brain radiotherapy also may carry a lower likelihood of tumor control at the treatment sites because of the low-dose used. Stereotactic radiosurgery carries a higher likelihood for local tumor control at the targeted sites with lower associated risk for neurocognitive changes such as memory loss. However, the use of stereotactic radiosurgery in this setting may leave the patient at increased risk for new brain metastases elsewhere in the brain as high as 50-60%. Accordingly, patients who receive stereotactic radiosurgery in this setting should undergo ongoing surveillance imaging with brain MRI more frequently in order to identify and treat new small brain metastases before they become symptomatic. Stereotactic radiosurgery does carry some different risks, including a risk of  radionecrosis.  PLAN: Today, I reviewed the findings and workup thus far with the patient. We discussed the decision regarding whole brain radiotherapy versus stereotactic radiosurgery. We discussed the pros and cons of each. We also discussed the logistics and delivery of each. We reviewed the results associated with each of the treatments described above. The patient seems to understand the treatment options and would like to proceed with stereotactic radiosurgery.  We will plan on single fraction SRS to the new 3 mm brain met with 30 Gy in 5 fractions to the left occipital resection cavity.  I personally spent 45 minutes in this encounter including chart review, reviewing radiological studies, meeting face-to-face with the patient, entering orders and completing documentation.   ------------------------------------------------   Margaretmary Dys, MD [facility]  Radiation Oncology Direct Dial: [phone]  Fax: [phone] [email or web address]  Skype  [email or web address]      Page Me

## 2022-10-25 ENCOUNTER — Ambulatory Visit
Admission: RE | Admit: 2022-10-25 | Discharge: 2022-10-25 | Disposition: A | Discharge status: Home / Self Care | Payer: 59 | Source: Ambulatory Visit | Attending: Radiation Oncology | Admitting: Radiation Oncology

## 2022-10-25 DIAGNOSIS — Z510 Encounter for antineoplastic radiation therapy: Secondary | ICD-10-CM | POA: Diagnosis not present

## 2022-10-25 DIAGNOSIS — C7951 Secondary malignant neoplasm of bone: Secondary | ICD-10-CM | POA: Insufficient documentation

## 2022-10-25 DIAGNOSIS — Z171 Estrogen receptor negative status [ER-]: Secondary | ICD-10-CM | POA: Insufficient documentation

## 2022-10-25 DIAGNOSIS — C50212 Malignant neoplasm of upper-inner quadrant of left female breast: Secondary | ICD-10-CM | POA: Insufficient documentation

## 2022-10-25 DIAGNOSIS — C7931 Secondary malignant neoplasm of brain: Secondary | ICD-10-CM

## 2022-10-25 NOTE — Progress Notes (Signed)
  Radiation Oncology         [phone]) [phone] ________________________________  Name: Melissa [name] MRN: [id number]  Date: 10/25/2022  DOB: 04-Sep-1965  SIMULATION AND TREATMENT PLANNING NOTE    ICD-10-CM   1. Metastasis to brain  C79.31       DIAGNOSIS:  The patient is 57 yo woman with ER+ Her2+ cancer of the upper inner quadrant of the left breast with metastatic disease to the brain s/p resection of a left occipital lesion, measuring 26 mm at the site of previous SRS/metastasis and a new 3 mm brain met in the right parietal cortex   NARRATIVE:  The patient was brought to the CT Simulation planning suite.  Identity was confirmed.  All relevant records and images related to the planned course of therapy were reviewed.  The patient freely provided informed written consent to proceed with treatment after reviewing the details related to the planned course of therapy. The consent form was witnessed and verified by the simulation staff. Intravenous access was established for contrast administration. Then, the patient was set-up in a stable reproducible supine position for radiation therapy.  A relocatable thermoplastic stereotactic head frame was fabricated for precise immobilization.  CT images were obtained.  Surface markings were placed.  The CT images were loaded into the planning software and fused with the patient's targeting MRI scan.  Then the target and avoidance structures were contoured.  Treatment planning then occurred.  The radiation prescription was entered and confirmed.  I have requested 3D planning  I have requested a DVH of the following structures: Brain stem, brain, left eye, right eye, lenses, optic chiasm, target volumes, uninvolved brain, and normal tissue.    SPECIAL TREATMENT PROCEDURE:  The planned course of therapy using radiation constitutes a special treatment procedure. Special care is required in the management of this patient for the following reasons. This treatment  constitutes a Special Treatment Procedure for the following reason: High dose per fraction requiring special monitoring for increased toxicities of treatment including daily imaging.  The special nature of the planned course of radiotherapy will require increased physician supervision and oversight to ensure patient's safety with optimal treatment outcomes.  This requires extended time and effort.  PLAN:  The patient will receive 20 Gy in 1 fraction to the right parietal met and 30 Gy in 5 fractions to the resection cavity in the left occipital lobe  ________________________________  Artist Pais. Kathrynn Running, M.D.

## 2022-10-26 ENCOUNTER — Ambulatory Visit
Admission: RE | Admit: 2022-10-26 | Discharge: 2022-10-26 | Disposition: A | Discharge status: Home / Self Care | Payer: 59 | Source: Ambulatory Visit | Attending: Radiation Oncology | Admitting: Radiation Oncology

## 2022-10-26 DIAGNOSIS — C7931 Secondary malignant neoplasm of brain: Secondary | ICD-10-CM

## 2022-10-26 MED ORDER — HEPARIN SOD (PORK) LOCK FLUSH 100 UNIT/ML IV SOLN
500.0000 [IU] | Freq: Once | INTRAVENOUS | Status: AC
  Administered 2022-10-26: 500 [IU] via INTRAVENOUS

## 2022-10-26 MED ORDER — SODIUM CHLORIDE 0.9% FLUSH
10.0000 mL | INTRAVENOUS | Status: DC | PRN
  Administered 2022-10-26: 10 mL via INTRAVENOUS

## 2022-10-26 MED ORDER — GADOPICLENOL 0.5 MMOL/ML IV SOLN
5.0000 mL | Freq: Once | INTRAVENOUS | Status: AC | PRN
  Administered 2022-10-26: 5 mL via INTRAVENOUS

## 2022-10-27 DIAGNOSIS — Z510 Encounter for antineoplastic radiation therapy: Secondary | ICD-10-CM | POA: Diagnosis not present

## 2022-10-28 ENCOUNTER — Ambulatory Visit
Admission: RE | Admit: 2022-10-28 | Discharge: 2022-10-28 | Disposition: A | Discharge status: Home / Self Care | Payer: 59 | Source: Ambulatory Visit | Attending: Radiation Oncology | Admitting: Radiation Oncology

## 2022-10-28 ENCOUNTER — Encounter: Payer: Self-pay | Admitting: Radiation Oncology

## 2022-10-28 ENCOUNTER — Other Ambulatory Visit: Payer: Self-pay

## 2022-10-28 DIAGNOSIS — Z510 Encounter for antineoplastic radiation therapy: Secondary | ICD-10-CM | POA: Diagnosis not present

## 2022-10-28 DIAGNOSIS — C7931 Secondary malignant neoplasm of brain: Secondary | ICD-10-CM

## 2022-10-28 LAB — RAD ONC ARIA SESSION SUMMARY
Course Elapsed Days: 0
Plan Fractions Treated to Date: 1
Plan Prescribed Dose Per Fraction: 6 Gy
Plan Total Fractions Prescribed: 1
Plan Total Prescribed Dose: 6 Gy
Reference Point Dosage Given to Date: 6 Gy
Reference Point Session Dosage Given: 6 Gy
Session Number: 1

## 2022-10-28 NOTE — Progress Notes (Signed)
  Radiation Oncology         [phone]) [phone] ________________________________  Name: Melissa [name] MRN: [id number]  Date: 10/28/2022  DOB: [birth year]  SPECIAL TREATMENT PROCEDURE NOTE  NARRATIVE:  The planned course of therapy using radiation constitutes a special treatment procedure. Special care is required in the management of this patient for the following reasons.  This will require extended time and effort from me.  I have requested : This treatment constitutes a Special Treatment Procedure for the following reason: [ Retreatment in a previously radiated area requiring careful monitoring of increased risk of toxicity due to overlap of previous treatment.  .This will be the patient's tenth course of SRS to the brain, and one of the targets has been previously treated.  We performed a total accumulated dose and reviewed this in MIM.  As expected there are some hot spots and she will likely face higher risks for complications such as radionecrosis.  However, there are not any alternative options to control the intracranial brain metastases.  The special nature of the planned course of radiotherapy will require increased physician supervision and oversight to ensure patient's safety with optimal treatment outcomes. ________________________________  [name], M.D.

## 2022-10-28 NOTE — Progress Notes (Signed)
Patient to nursing for 30 minute observation after [facility] Brain radiation treatment.  Reports headache, vision changes, and some balance issues doesn't use walker.  Reports has neuropathy.  Plans to see eye doctor soon.  Denies fatigue, nausea/vomiting, skin irritation, ringing in the ears, and speech is clear.  Decadron 2 mg po on taper started today and ends 11/02/2022.  Vitals:  98.3-83-18-119/72 O2 sat 100%.  Advised not to do anything strenuous for the next 24 hours.  Advised to call if symptoms so worsen [phone].  Ambulated out of clinic without difficulty or assistance.

## 2022-10-30 NOTE — Progress Notes (Signed)
  Radiation Oncology         [phone]) [phone] ________________________________  Stereotactic Treatment Procedure Note  Name: JOVAN SCHICKLING MRN: [id number]  Date: 10/28/2022  DOB: October 16, 1965  SPECIAL TREATMENT PROCEDURE    ICD-10-CM   1. 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  C79.31       3D TREATMENT PLANNING AND DOSIMETRY:  The patient's radiation plan was reviewed and approved by neurosurgery and radiation oncology prior to treatment.  It showed 3-dimensional radiation distributions overlaid onto the planning CT/MRI image set.  The [facility] for the target structures as well as the organs at risk were reviewed. The documentation of the 3D plan and dosimetry are filed in the radiation oncology EMR.  NARRATIVE:  KENADI MILTNER was brought to the TrueBeam stereotactic radiation treatment machine and placed supine on the CT couch. The head frame was applied, and the patient was set up for stereotactic radiosurgery.  Neurosurgery was present for the set-up and delivery  SIMULATION VERIFICATION:  In the couch zero-angle position, the patient underwent Exactrac imaging using the Brainlab system with orthogonal KV images.  These were carefully aligned and repeated to confirm treatment position for each of the isocenters.  The Exactrac snap film verification was repeated at each couch angle.  PROCEDURE: Ivory Broad Pelfrey received stereotactic radiosurgery to the following targets: Left Occipital 31 mm post-op resection site target was treated using 5 Rapid Arc VMAT Beams to a prescription dose of 6 Gy to be repeated for 5 fractions to 30 Gy total.  ExacTrac registration was performed for each couch angle.  The 100% isodose line was prescribed.  6 MV X-rays were delivered in the flattening filter free beam mode. Right parietal 3 mm target was treated using 5 Rapid Arc VMAT Beams to a prescription dose of 20 Gy.  ExacTrac registration was performed for each couch angle.  The 100% isodose line was prescribed.  6 MV  X-rays were delivered in the flattening filter free beam mode.  STEREOTACTIC TREATMENT MANAGEMENT:  Following delivery, the patient was transported to nursing in stable condition and monitored for possible acute effects.  Vital signs were recorded LMP 07/25/2012 Comment: pregnancy waiver form signed 01-18-2018. The patient tolerated treatment without significant acute effects, and was discharged to home in stable condition.    PLAN: Complete fractionated stereotactic radiotherapy to the post-op target and then Follow-up in one month.  ________________________________  Artist Pais. Kathrynn Running, M.D.

## 2022-10-31 ENCOUNTER — Other Ambulatory Visit: Payer: Self-pay

## 2022-10-31 ENCOUNTER — Ambulatory Visit
Admission: RE | Admit: 2022-10-31 | Discharge: 2022-10-31 | Disposition: A | Discharge status: Home / Self Care | Payer: 59 | Source: Ambulatory Visit | Attending: Radiation Oncology | Admitting: Radiation Oncology

## 2022-10-31 DIAGNOSIS — Z510 Encounter for antineoplastic radiation therapy: Secondary | ICD-10-CM | POA: Diagnosis not present

## 2022-10-31 DIAGNOSIS — C7931 Secondary malignant neoplasm of brain: Secondary | ICD-10-CM

## 2022-10-31 LAB — RAD ONC ARIA SESSION SUMMARY
Course Elapsed Days: 3
Plan Fractions Treated to Date: 1
Plan Prescribed Dose Per Fraction: 6 Gy
Plan Total Fractions Prescribed: 4
Plan Total Prescribed Dose: 24 Gy
Reference Point Dosage Given to Date: 12 Gy
Reference Point Session Dosage Given: 6 Gy
Session Number: 2

## 2022-10-31 NOTE — Progress Notes (Signed)
Patient to nursing for 15 minute observation SRS Brain.  Reports headache, vision changes, and some balance issues doesn't use walker. 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 Plans to see eye doctor on 11/03/2022. Denies fatigue, nausea/vomiting, skin irritation, ringing in the ears, and speech is clear. Decadron 2 mg po on taper and ends 11/03/2022.   Advised not to do anything strenuous for next 24 hours.  To call [phone] is symptoms persists or worsen.  Vitals:  97.1-77-18-113/66 O2 sat % room air.  Patient walked out of clinic without difficulty or assistance.

## 2022-11-02 ENCOUNTER — Ambulatory Visit
Admission: RE | Admit: 2022-11-02 | Discharge: 2022-11-02 | Disposition: A | Discharge status: Home / Self Care | Payer: 59 | Source: Ambulatory Visit | Attending: Radiation Oncology | Admitting: Radiation Oncology

## 2022-11-02 ENCOUNTER — Other Ambulatory Visit: Payer: Self-pay

## 2022-11-02 ENCOUNTER — Encounter: Payer: Self-pay | Admitting: Hematology and Oncology

## 2022-11-02 DIAGNOSIS — Z510 Encounter for antineoplastic radiation therapy: Secondary | ICD-10-CM | POA: Diagnosis not present

## 2022-11-02 DIAGNOSIS — C7931 Secondary malignant neoplasm of brain: Secondary | ICD-10-CM

## 2022-11-02 LAB — RAD ONC ARIA SESSION SUMMARY
Course Elapsed Days: 5
Plan Fractions Treated to Date: 2
Plan Prescribed Dose Per Fraction: 6 Gy
Plan Total Fractions Prescribed: 4
Plan Total Prescribed Dose: 24 Gy
Reference Point Dosage Given to Date: 18 Gy
Reference Point Session Dosage Given: 6 Gy
Session Number: 3

## 2022-11-02 NOTE — Progress Notes (Signed)
Report headache, vision changes, and some balance issues doesn't use walker. 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 Denies fatigue, nausea/vomiting, skin irritation, ringing in the ears, and speech is clear. Decadron 2 mg po on taper and ends 11/03/2022.   Advised not to do anything strenuous for next 24 hours.  To call [phone] is symptoms persists or worsen.  Vitals:  97.7-80-18-111/75 O2 sat % room air.  Patient walked out of clinic without difficulty or assistance.  Patient requested letter to be faxed or emailed to her dental office for clearance to have bottom set of teeth extracted for dentures.  Patient was made aware that dental clearance note will be sent over to her dental office.

## 2022-11-04 ENCOUNTER — Other Ambulatory Visit: Payer: Self-pay

## 2022-11-04 ENCOUNTER — Ambulatory Visit
Admission: RE | Admit: 2022-11-04 | Discharge: 2022-11-04 | Disposition: A | Discharge status: Home / Self Care | Payer: 59 | Source: Ambulatory Visit | Attending: Radiation Oncology | Admitting: Radiation Oncology

## 2022-11-04 ENCOUNTER — Telehealth: Payer: Self-pay | Admitting: Radiation Oncology

## 2022-11-04 VITALS — BP 116/68 | HR 80 | Temp 97.7°F | Resp 18 | Ht 62.0 in

## 2022-11-04 DIAGNOSIS — Z510 Encounter for antineoplastic radiation therapy: Secondary | ICD-10-CM | POA: Diagnosis not present

## 2022-11-04 DIAGNOSIS — C7931 Secondary malignant neoplasm of brain: Secondary | ICD-10-CM

## 2022-11-04 LAB — RAD ONC ARIA SESSION SUMMARY
Course Elapsed Days: 7
Plan Fractions Treated to Date: 3
Plan Prescribed Dose Per Fraction: 6 Gy
Plan Total Fractions Prescribed: 4
Plan Total Prescribed Dose: 24 Gy
Reference Point Dosage Given to Date: 24 Gy
Reference Point Session Dosage Given: 6 Gy
Session Number: 4

## 2022-11-04 NOTE — Progress Notes (Signed)
Patient to nursing for 15 minutes observation.  No complaints.  Happy to be coming to end of treatment has 1 more to go.  Finished Decadron taper per patient.  No new complaints voiced.  Ambulating out of clinic without difficulty.

## 2022-11-04 NOTE — Telephone Encounter (Signed)
4/26 @ 8:27 am  Patient called to reschedule her treatment appt from 4/29 to 4/30 due to other appts same time.  Called Darl Pikes no answer and L1 machine spoke to [facility] and transfer called so they are aware.

## 2022-11-06 NOTE — Op Note (Signed)
Name: Melissa [name]    MRN: [id number]   Date: 10/28/2022    DOB: 1966/06/06   STEREOTACTIC RADIOSURGERY OPERATIVE NOTE  PRE-OPERATIVE DIAGNOSIS: Metastatic breast cancer to the brain, 3 lesions (right parietal lesion, and left occipital lobe lesion)  POST-OPERATIVE DIAGNOSIS:  Same  PROCEDURE:  Stereotactic Radiosurgery  SURGEON:  Monia Pouch, DO  RADIATION ONCOLOGIST: Margaretmary Dys, MD  TECHNIQUE:  The patient underwent a radiation treatment planning session in the radiation oncology simulation suite under the care of the radiation oncology physician and physicist.  I participated closely in the radiation treatment planning afterwards. The patient underwent planning CT which was fused to 3T high resolution MRI with 1 mm axial slices.  These images were fused on the planning system.  We contoured the gross target volumes and subsequently expanded this to yield the Planning Target Volume. I actively participated in the planning process.  I helped to define and review the target contours and also the contours of the optic pathway, eyes, brainstem and selected nearby organs at risk.  All the dose constraints for critical structures were reviewed and compared to AAPM Task Group 101.  The prescription dose conformity was reviewed.  I approved the plan electronically.    Accordingly, [name]  was brought to the TrueBeam stereotactic radiation treatment linac and placed in the custom immobilization mask.  The patient was aligned according to the IR fiducial markers with BrainLab Exactrac, then orthogonal x-rays were used in ExacTrac with the 6DOF robotic table and the shifts were made to align the patient.  [name] received stereotactic radiosurgery to a prescription dose of 20 Gy uneventfully to the right parietal lesion and 6 Gray to the left occipital lesion and fraction 1 of 4.    The detailed description of the procedure is recorded in the radiation oncology procedure note.  I was  present for the duration of the procedure.  DISPOSITION:   Following delivery, the patient was transported to nursing in stable condition and monitored for possible acute effects to be discharged to home in stable condition with follow-up in one month.  Monia Pouch, DO [facility]

## 2022-11-06 NOTE — Addendum Note (Signed)
Encounter addended by: Bethann Goo, DO on: 11/06/2022 12:55 PM  Actions taken: Clinical Note Signed

## 2022-11-07 ENCOUNTER — Ambulatory Visit: Payer: 59 | Admitting: Radiation Oncology

## 2022-11-07 ENCOUNTER — Encounter: Payer: Self-pay | Admitting: Hematology and Oncology

## 2022-11-07 ENCOUNTER — Other Ambulatory Visit: Payer: Self-pay | Admitting: *Deleted

## 2022-11-07 DIAGNOSIS — C50912 Malignant neoplasm of unspecified site of left female breast: Secondary | ICD-10-CM

## 2022-11-07 NOTE — Progress Notes (Signed)
Per MD request, referral placed to psychology.

## 2022-11-07 NOTE — Progress Notes (Signed)
Received call from behavioral health stating Dr. Bosie Clos is no longer with [facility].  Per MD request referral removed and referral placed to social work and chaplin.

## 2022-11-08 ENCOUNTER — Other Ambulatory Visit: Payer: Self-pay

## 2022-11-08 ENCOUNTER — Inpatient Hospital Stay: Payer: 59 | Admitting: Licensed Clinical Social Worker

## 2022-11-08 ENCOUNTER — Encounter: Payer: Self-pay | Admitting: Hematology and Oncology

## 2022-11-08 ENCOUNTER — Other Ambulatory Visit (HOSPITAL_COMMUNITY): Payer: Self-pay

## 2022-11-08 ENCOUNTER — Telehealth: Payer: Self-pay

## 2022-11-08 ENCOUNTER — Inpatient Hospital Stay: Payer: 59

## 2022-11-08 ENCOUNTER — Telehealth: Payer: Self-pay | Admitting: Pharmacy Technician

## 2022-11-08 ENCOUNTER — Encounter: Payer: Self-pay | Admitting: Urology

## 2022-11-08 ENCOUNTER — Inpatient Hospital Stay: Payer: 59 | Admitting: Hematology and Oncology

## 2022-11-08 ENCOUNTER — Ambulatory Visit
Admission: RE | Admit: 2022-11-08 | Discharge: 2022-11-08 | Disposition: A | Discharge status: Home / Self Care | Payer: 59 | Source: Ambulatory Visit | Attending: Radiation Oncology | Admitting: Radiation Oncology

## 2022-11-08 VITALS — BP 113/44 | HR 82 | Resp 18

## 2022-11-08 VITALS — BP 111/84 | HR 83 | Temp 97.8°F | Resp 18 | Ht 62.0 in | Wt 109.5 lb

## 2022-11-08 DIAGNOSIS — Z171 Estrogen receptor negative status [ER-]: Secondary | ICD-10-CM

## 2022-11-08 DIAGNOSIS — C50212 Malignant neoplasm of upper-inner quadrant of left female breast: Secondary | ICD-10-CM

## 2022-11-08 DIAGNOSIS — C7931 Secondary malignant neoplasm of brain: Secondary | ICD-10-CM

## 2022-11-08 DIAGNOSIS — C50912 Malignant neoplasm of unspecified site of left female breast: Secondary | ICD-10-CM

## 2022-11-08 DIAGNOSIS — Z510 Encounter for antineoplastic radiation therapy: Secondary | ICD-10-CM | POA: Diagnosis not present

## 2022-11-08 LAB — RAD ONC ARIA SESSION SUMMARY
Course Elapsed Days: 11
Plan Fractions Treated to Date: 4
Plan Prescribed Dose Per Fraction: 6 Gy
Plan Total Fractions Prescribed: 4
Plan Total Prescribed Dose: 24 Gy
Reference Point Dosage Given to Date: 30 Gy
Reference Point Session Dosage Given: 6 Gy
Session Number: 5

## 2022-11-08 MED ORDER — TRASTUZUMAB-ANNS CHEMO 150 MG IV SOLR
6.0000 mg/kg | Freq: Once | INTRAVENOUS | Status: AC
  Administered 2022-11-08: 300 mg via INTRAVENOUS
  Filled 2022-11-08: qty 14.29

## 2022-11-08 MED ORDER — ACETAMINOPHEN 325 MG PO TABS
650.0000 mg | ORAL_TABLET | Freq: Once | ORAL | Status: AC
  Administered 2022-11-08: 650 mg via ORAL
  Filled 2022-11-08: qty 2

## 2022-11-08 MED ORDER — HEPARIN SOD (PORK) LOCK FLUSH 100 UNIT/ML IV SOLN
500.0000 [IU] | Freq: Once | INTRAVENOUS | Status: AC | PRN
  Administered 2022-11-08: 500 [IU]

## 2022-11-08 MED ORDER — NERATINIB MALEATE 40 MG PO TABS
80.0000 mg | ORAL_TABLET | Freq: Every day | ORAL | 0 refills | Status: DC
  Filled 2022-11-08: qty 60, 30d supply, fill #0

## 2022-11-08 MED ORDER — SODIUM CHLORIDE 0.9 % IV SOLN: Freq: Once | INTRAVENOUS | Status: AC

## 2022-11-08 MED ORDER — DIPHENHYDRAMINE HCL 25 MG PO CAPS
25.0000 mg | ORAL_CAPSULE | Freq: Once | ORAL | Status: AC
  Administered 2022-11-08: 25 mg via ORAL
  Filled 2022-11-08: qty 1

## 2022-11-08 MED ORDER — SODIUM CHLORIDE 0.9% FLUSH
10.0000 mL | INTRAVENOUS | Status: DC | PRN
  Administered 2022-11-08: 10 mL

## 2022-11-08 NOTE — Progress Notes (Signed)
[facility] Clinical Social Work  Clinical Social Work was referred by nurse for assessment of psychosocial needs.  Clinical Social Worker met with patient in infusion to offer support and assess for needs.   Patient shared that she has been dealing with more mentally and emotionally since her last brain surgery in January. She has supportive family and friends but they and she know that there are some things they don't fully understand that she is dealing with.  Pt is used to being very active (in her garden, with her chickens and goats, making art, baking/cooking) and physical changes have been hard to accept.  CSW provided supportive listening and informed pt of support services available through [facility] as well as [employer].  Pt does have appointments scheduled (as of this morning) with a local therapist to support her in processing and adjusting mentally and emotionally.  CSW encouraged pt to reach out to this CSW or [facility] chaplain for additional support if needed.     Monterio Bob E Reinette Cuneo, LCSW  Clinical Social Worker [facility]

## 2022-11-08 NOTE — Telephone Encounter (Signed)
Oral Oncology Pharmacist Encounter  Received new prescription for Nerlynx for the treatment of metastatic, ER positive, PR positive, HER-2 positive breast cancer, planned duration until disease progression or unacceptable toxicity occurs.  Labs from 05/24/22 assessed, appropriate for treatment initiation. No interventions needed. Prescription dose and frequency assessed for appropriateness. Patient starting at reduced dose due to intolerance to Nerlynx in the past.   Current medication list in Epic reviewed, DDIs with Nerlynx identified: Voriconazle: Concomitant administration with Nerlynx should be avoided, as voriconazole can increase the concentration of Nerlynx upwards of 3.2-fold. The [facility] pharmacy team has been contacted since the patient follows with them for aspergillus treatment.   Evaluated chart and no patient barriers to medication adherence noted.   Patient agreement for treatment documented in MD note on 11/08/22.  Prescription has been e-scribed to the [facility] for benefits analysis and approval.  Oral Oncology Clinic will continue to follow for insurance authorization, copayment issues, initial counseling and start date.  Lennie Muckle, PharmD Candidate 11/08/2022 9:48 AM Oral Oncology Clinic [phone]

## 2022-11-08 NOTE — Telephone Encounter (Addendum)
Oral Oncology Patient Advocate Encounter   Received notification that prior authorization for Nerlynx is required.   PA submitted on 11/08/22 Submitted via online portal  Case ID [id number] Status is pending     Jinger Neighbors, CPhT-Adv Oncology Pharmacy Patient Advocate [facility] Direct Number: [phone]  Fax: [phone]

## 2022-11-08 NOTE — Patient Instructions (Signed)
[facility]  Discharge Instructions: Thank you for choosing [facility] to provide your oncology and hematology care.   If you have a lab appointment with the Cancer Center, please go directly to the Cancer Center and check in at the registration area.   Wear comfortable clothing and clothing appropriate for easy access to any Portacath or PICC line.   We strive to give you quality time with your provider. You may need to reschedule your appointment if you arrive late (15 or more minutes).  Arriving late affects you and other patients whose appointments are after yours.  Also, if you miss three or more appointments without notifying the office, you may be dismissed from the clinic at the provider's discretion.      For prescription refill requests, have your pharmacy contact our office and allow 72 hours for refills to be completed.    Today you received the following chemotherapy and/or immunotherapy agents: Trastuzumab      To help prevent nausea and vomiting after your treatment, we encourage you to take your nausea medication as directed.  BELOW ARE SYMPTOMS THAT SHOULD BE REPORTED IMMEDIATELY: *FEVER GREATER THAN 100.4 F (38 C) OR HIGHER *CHILLS OR SWEATING *NAUSEA AND VOMITING THAT IS NOT CONTROLLED WITH YOUR NAUSEA MEDICATION *UNUSUAL SHORTNESS OF BREATH *UNUSUAL BRUISING OR BLEEDING *URINARY PROBLEMS (pain or burning when urinating, or frequent urination) *BOWEL PROBLEMS (unusual diarrhea, constipation, pain near the anus) TENDERNESS IN MOUTH AND THROAT WITH OR WITHOUT PRESENCE OF ULCERS (sore throat, sores in mouth, or a toothache) UNUSUAL RASH, SWELLING OR PAIN  UNUSUAL VAGINAL DISCHARGE OR ITCHING   Items with * indicate a potential emergency and should be followed up as soon as possible or go to the Emergency Department if any problems should occur.  Please show the CHEMOTHERAPY ALERT CARD or IMMUNOTHERAPY ALERT CARD at  check-in to the Emergency Department and triage nurse.  Should you have questions after your visit or need to cancel or reschedule your appointment, please contact [facility]  Dept: [phone]  and follow the prompts.  Office hours are 8:00 a.m. to 4:30 p.m. Monday - Friday. Please note that voicemails left after 4:00 p.m. may not be returned until the following business day.  We are closed weekends and major holidays. You have access to a nurse at all times for urgent questions. Please call the main number to the clinic Dept: [phone] and follow the prompts.   For any non-urgent questions, you may also contact your provider using MyChart. We now offer e-Visits for anyone 18 and older to request care online for non-urgent symptoms. For details visit [email or web address].   Also download the MyChart app! Go to the app store, search "MyChart", open the app, select [facility], and log in with your MyChart username and password.  

## 2022-11-08 NOTE — Progress Notes (Signed)
Patient to nursing for 15 minute observation SRS Brain completed 5/5 treatments.  Reports headaches have decreased since taking Decadron.  Reports was taking 2 mg Decadron dose and that it has been reduced to 1 mg taper qd that she will be finishing soon.  Denies Cognitive changes, ringing in ears, fatigue, nausea/vomiting, and skin irritation.  Reports some mild vision changes but stated it's likely due to the medication side effects.  Speech clear and audible.  Ambulated out of clinic independently without assisted devices.  Vitals:  97.4-74-18-121/70 O2 sat @ 100% on room air.

## 2022-11-08 NOTE — Progress Notes (Signed)
Patient Care Team: Thana Ates, MD as PCP - General (Internal Medicine) Domenick Gong, MD as Referring Physician (Emergency Medicine)  DIAGNOSIS:  Encounter Diagnosis  Name Primary?   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 Ki-67 was 70% HER-2 was amplified with a ratio 2.91   07/12/2012 - 07/17/2013 Neo-Adjuvant Chemotherapy   TCH 6 followed by Herceptin maintenance   12/11/2012 Surgery   Left breast lumpectomy: 1.8 cm tumor 1 positive sentinel node, axillary lymph node dissection 02/08/2013 showed 0/13 lymph nodes   03/25/2013 - 05/06/2013 Radiation Therapy   Adjuvant radiation therapy   06/05/2013 - 07/20/2017 Anti-estrogen oral therapy   Tamoxifen 20 mg daily   07/27/2017 Relapse/Recurrence   MRI Brain: 3.4 x 2.9 x 2.9 cm RIGHT frontal lobe mass with imaging characteristics of solitary metastasis. Extensive vasogenic edema resulting in 9 mm RIGHT to LEFT midline shift. Equivocal very early LEFT ventricle entrapment.    08/04/2017 Surgery   Rt frontal brain resection: Poorly differentiated tumor IHC suggests breast primary ER and PR Positive   08/25/2017 - 09/04/2017 Radiation Therapy   Stereotactic radiation   09/18/2017 -  Anti-estrogen oral therapy   Lapatinib with letrozole   12/18/2017 - 12/19/2017 Radiation Therapy   New right parietal lobe metastases status post [facility]   08/27/2018 - 08/27/2020 Radiation Therapy   SRS to new brain metastases   05/09/2019 Relapse/Recurrence   Interval increase in size of the enhancing nodular left internal mammary soft tissue 2.8 cm.  Redemonstrated  enlarged supraclavicular, lower cervical and lower posterior cervical nodes unchanged.  Interval increase in the bony erosion of the posterior and lateral left third rib, increasing soft tissue lesion eroding the left sternal body 3.1 cm was 2.5 cm.  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 with the above-mentioned history of metastatic breast cancer currently on treatment with Xeloda and Herceptin. She presents to the clinic today for follow-up. She reports that surgery went well. She states that the steroids is causing mood swings and irritability. She says things got worst since January.   ALLERGIES:  is allergic to aspirin, protonix [pantoprazole], doxycycline, promethazine-codeine, sulfamethoxazole-trimethoprim, and iodinated contrast media.  MEDICATIONS:  Current Outpatient Medications  Medication Sig Dispense Refill   acetaminophen (TYLENOL) 325 MG tablet Take 2 tablets (650 mg total) by mouth every 6 (six) hours as needed for mild pain (or  Fever >/= 101). 12 tablet 0   albuterol (VENTOLIN HFA) 108 (90 Base) MCG/ACT inhaler Inhale 2 puffs into the lungs every 6 (six) hours as needed for wheezing or shortness of breath (cough). 18 g 5   Cholecalciferol 250 MCG (10000 UT) CAPS Take by mouth.     Fluticasone-Salmeterol,sensor, (AIRDUO DIGIHALER) 113-14 MCG/ACT AEPB Inhale 1 puff into the lungs 2 (two) times daily. 1 each 4   GARLIC PO Take by mouth.     lidocaine-prilocaine (EMLA) cream APPLY EXTERNALLY TO THE AFFECTED AREA 1 TIME 30 g 3   Multiple Vitamins-Minerals (EMERGEN-C VITAMIN C PO) Take by mouth.     Neratinib Maleate (NERLYNX) 40 MG tablet Take 2 tablets (80 mg total) by mouth daily. Take with food. 60 tablet 0   OIL OF OREGANO PO Take 500 mg by mouth 2 (two) times daily.     oxyCODONE (OXY IR/ROXICODONE) 5 MG immediate release tablet Take by mouth.     predniSONE (DELTASONE) 10 MG tablet Take by mouth.     SYMBICORT 160-4.5 MCG/ACT inhaler Inhale 2 puffs into the lungs 2 (two) times daily.     venlafaxine XR (EFFEXOR-XR) 37.5 MG 24 hr capsule Take 37.5 mg by mouth daily.     Vitamin E (VITAMIN E/D-ALPHA NATURAL) 268 MG (400 UNIT) CAPS Take by mouth.     voriconazole (VFEND) 200 MG tablet Take 1 tablet (200 mg total) by mouth 2 (two) times daily. 60 tablet 11   levETIRAcetam (KEPPRA) 500 MG tablet Take by mouth.     No current facility-administered medications for this visit.   Facility-Administered Medications Ordered in Other Visits  Medication Dose Route Frequency Provider Last Rate Last Admin   alteplase (CATHFLO ACTIVASE) injection 2 mg  2 mg Intracatheter Once PRN Serena Croissant, MD       sodium chloride flush (NS) 0.9 % injection 10 mL  10 mL Intracatheter PRN Serena Croissant, MD   10 mL at 07/06/21 1428    PHYSICAL EXAMINATION: ECOG PERFORMANCE STATUS: 1 - Symptomatic but completely ambulatory  Vitals:   11/08/22 0829  BP: 111/84  Pulse: 83  Resp: 18  Temp: 97.8 F (36.6 C)  SpO2: 97%   Filed Weights    11/08/22 0829  Weight: 109 lb 8 oz (49.7 kg)      LABORATORY DATA:  I have reviewed the data as listed    Latest Ref Rng & Units 05/24/2022   10:31 AM 04/12/2022    8:20 AM 02/28/2022    9:32 AM  CMP  Glucose 70 - 99 mg/dL 56  96  161   BUN 6 - 20 mg/dL 11  13  12    Creatinine 0.44 - 1.00 mg/dL 0.96  0.45  4.09   Sodium 135 - 145 mmol/L 139  138  139   Potassium 3.5 - 5.1 mmol/L 4.5  4.1  4.1   Chloride 98 - 111 mmol/L 104  106  106   CO2 22 - 32 mmol/L 31  29  29    Calcium 8.9 - 10.3 mg/dL 81.1  9.4  91.4   Total Protein 6.5 - 8.1 g/dL 8.2  7.2  7.4   Total Bilirubin 0.3 - 1.2  mg/dL 0.2  0.2  0.2   Alkaline Phos 38 - 126 U/L 74  71  72   AST 15 - 41 U/L 14  16  19    ALT 0 - 44 U/L 8  13  13      Lab Results  Component Value Date   WBC 11.3 (H) 05/24/2022   HGB 12.1 05/24/2022   HCT 35.7 (L) 05/24/2022   MCV 94.7 05/24/2022   PLT 406 (H) 05/24/2022   NEUTROABS 7.7 05/24/2022    ASSESSMENT & PLAN:  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  SRS brain: 08/25/2017-09/04/2017 2. Anti Her 2 therapy with Lapatinib started 09/17/2017-05/14/2019: Stopped for progression 3.  I discontinued tamoxifen and started her on letrozole 2.5 mg daily.  05/14/2019 stopped for progression 4.  Stereotactic radiosurgery 12/19/2017 to the new right parietal lobe metastases. 5.  Kadcyla: Received 16 cycles discontinued 05/05/2020 6.  Tucatinib Xeloda: Discontinued because  of hand-foot syndrome 06/11/2020-12/15/2020 7. Herceptin/Neratinib: unable to tolerate due to diarrhea and weight loss 8.  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  She and her husband are getting therapy. Resection of one of the lesions in the occipital lobe on 10/06/2022 at [facility]: ER 0%, PR 0%, HER2 3+ positive   Continue with every 3-week Herceptin next scans will be done in June.   Because of persistent worsening of her brain metastases, I recommended that we try once again giving her an oral TKI.  We chose neratinib because it could be given without adding Xeloda Patient will see Jonny Ruiz in May for follow-up on neratinib.  If she tolerates neratinib 80 mg dose we will slowly increase it.    Orders Placed This Encounter  Procedures   CBC with Differential (Cancer Center Only)    Standing Status:   Future    Standing Expiration Date:   11/08/2023   CMP (Cancer Center only)    Standing Status:   Future    Standing Expiration Date:   11/08/2023   The patient has a good understanding of the overall plan. she agrees with it. she will call with any problems  that may develop before the next visit here. Total time spent: 30 mins including face to face time and time spent for planning, charting and co-ordination of care   Tamsen Meek, MD 11/08/22    I Janan Ridge am acting as a Neurosurgeon for Dr.Flecia [employer]  I have reviewed the above documentation for accuracy and completeness, and I agree with the above.

## 2022-11-08 NOTE — Assessment & Plan Note (Addendum)
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  SRS brain: 08/25/2017-09/04/2017 2. Anti Her 2 therapy with Lapatinib started 09/17/2017-05/14/2019: Stopped for progression 3.  I discontinued tamoxifen and started her on letrozole 2.5 mg daily.  05/14/2019 stopped for progression 4.  Stereotactic radiosurgery 12/19/2017 to the new right parietal lobe metastases. 5.  Kadcyla: Received 16 cycles discontinued 05/05/2020 6.  Tucatinib Xeloda: Discontinued because of hand-foot syndrome 06/11/2020-12/15/2020 7. Herceptin/Neratinib: unable to tolerate due to diarrhea and weight loss 8.  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  She and her husband are getting therapy. Resection  of one of the lesions in the occipital lobe on 10/06/2022 at [facility]: ER 0%, PR 0%, HER2 3+ positive   Continue with every 3-week Herceptin next scans will be done in June.   Because of persistent worsening of her brain metastases, I recommended that we try once again giving her an oral TKI.  We chose neratinib because it could be given without adding Xeloda Patient will see Jonny Ruiz in May for follow-up on neratinib.  If she tolerates neratinib 80 mg dose we will slowly increase it.

## 2022-11-10 ENCOUNTER — Encounter: Payer: Self-pay | Admitting: General Practice

## 2022-11-10 ENCOUNTER — Telehealth: Payer: Self-pay | Admitting: Hematology and Oncology

## 2022-11-10 NOTE — Progress Notes (Signed)
[facility] Spiritual Care Note  Referred by nursing for additional layer of emotional support. Left voicemail of introduction, encouraging return call.   [address] Rush Barer, [place], [facility] Pager [phone] Voicemail [phone]

## 2022-11-10 NOTE — Telephone Encounter (Signed)
Scheduled appointments per los. Left voicemail. 

## 2022-11-11 NOTE — Radiation Completion Notes (Addendum)
  Radiation Oncology         [phone] ________________________________  Name: DALAYA PAGNI MRN: [id number]  Date: 11/08/2022  DOB: 07/18/1965  End of Treatment Note  Patient Name: Melissa [name], Melissa [name] MRN: [id number] Date of Birth: 11/15/1965 Referring Physician: Elissa Hefty, M.D. Date of Service: 2022-11-11 Radiation Oncologist: Margaretmary Bayley, M.D. [facility]                             RADIATION ONCOLOGY END OF TREATMENT NOTE     Diagnosis: 57 yo woman with ER+ Her2+ cancer of the upper inner quadrant of the left breast with metastatic disease to the brain s/p resection of a left occipital lesion, measuring 26 mm at the site of previous SRS/metastasis and a new 3 mm brain met in the right parietal cortex   Intent: Palliative     ==========DELIVERED PLANS==========  First Treatment Date: 2022-10-28 - Last Treatment Date: 2022-11-08   Plan Name: Brain_SRT_f1 Site: Brain Technique: SBRT/SRT-IMRT Mode: Photon Dose Per Fraction: 6 Gy Prescribed Dose (Delivered / Prescribed): 6 Gy / 6 Gy Prescribed Fxs (Delivered / Prescribed): 1 / 1   Plan Name: Brain_SRT_f2-5 Site: Brain Technique: SBRT/SRT-IMRT Mode: Photon Dose Per Fraction: 6 Gy Prescribed Dose (Delivered / Prescribed): 24 Gy / 24 Gy Prescribed Fxs (Delivered / Prescribed): 4 / 4     ==========ON TREATMENT VISIT DATES========== 2022-10-28, 2022-10-31, 2022-11-02, 2022-11-04, 2022-11-08     See weekly On Treatment Notes is Epic for details. The patient tolerated radiation treatment relatively well with only modest fatigue.  The patient will receive a call in about one month from the radiation oncology department. She will continue follow up with Dr. Pamelia Hoit, Dr. Barbaraann Cao and Dr. Shaune Pollack at [facility], as well.  ------------------------------------------------   Margaretmary Dys, MD [facility]  Radiation Oncology Direct Dial: [phone]  Fax: [phone] [email or web address]  Skype   LinkedIn

## 2022-11-14 ENCOUNTER — Other Ambulatory Visit (HOSPITAL_COMMUNITY): Payer: Self-pay

## 2022-11-14 NOTE — Telephone Encounter (Signed)
Oral Oncology Patient Advocate Encounter  Prior Authorization for Nerlynx has been approved.    PA# [id number]  Effective dates: 11/08/22 through 11/08/23  Patient must fill at [facility].    Jinger Neighbors, CPhT-Adv Oncology Pharmacy Patient Advocate [facility] Direct Number: [phone]  Fax: [phone]

## 2022-11-15 MED ORDER — NERATINIB MALEATE 40 MG PO TABS: 80.0000 mg | ORAL_TABLET | Freq: Every day | ORAL | 0 refills | Status: DC

## 2022-11-21 ENCOUNTER — Telehealth: Payer: Self-pay | Admitting: Hematology and Oncology

## 2022-11-21 NOTE — Telephone Encounter (Signed)
Oral Chemotherapy Pharmacist Encounter  I spoke with patient for overview of: Nerlynx (neratinib) for the maintenance adjuvant treatment of Her-2 receptor positive, hormone receptor-positive breast cancer, after completion of Herceptin, planned duration one year. Patient is restarting after not tolerating the previous dosage.  Counseled patient on administration, dosing, side effects, monitoring, drug-food interactions, safe handling, storage, and disposal.  Prescribing information for Nerlynx recommends dose initiation at full dose, 6 tablets (240 mg) once daily, with the use of scheduled loperamide antidiarrheal prophylaxis for the first 8 weeks of therapy.  Nerlynx will be initiated on a dose titration schedule. Antidiarrheal prophylaxis will not be used.   Patient will use loperamide as needed if symptoms of diarrhea occur.   Patient is restarting the nerlynx at a lower dosage. Patient will take Nerlynx 40mg  tablets, 2 tablets (80mg ) by mouth once daily with food  Patient knows to avoid grapefruit and grapefruit juice while on treatment with Nerlynx.  Patient instructed to avoid use of PPIs or H2RAs while on treatment with Nerlynx, can separate antacids from Nerlynx by 3 hours if acid suppression is needed.  Nerlynx start date: 11/22/22  Adverse effects include but are not limited to: diarrhea, nausea, vomiting, mouth sores, fatigue, rash, and abdominal pain.    Patient instructed to increase or decrease loperamide dose to maintain 1-2 bowel movements per day.  Reviewed with patient importance of keeping a medication schedule and plan for any missed doses. No barriers to medication adherence identified.  Medication reconciliation performed and medication/allergy list updated.  Insurance authorization for Nerlynx has been obtained. Patient received medication from [facility] specialty pharmacy.   Patient informed the pharmacy will reach out 5-7 days prior to needing next fill of Nerlynx  to coordinate continued medication acquisition to prevent break in therapy.  All questions answered.  Patient voiced understanding and appreciation.   Medication education handout placed in mail for patient. Patient knows to call the office with questions or concerns. Oral Chemotherapy Clinic phone number provided to patient.   Bethel Born, PharmD Hematology/Oncology Clinical Pharmacist [facility] Oral Chemotherapy Navigation Clinic [phone] 11/21/2022   3:16 PM

## 2022-11-21 NOTE — Telephone Encounter (Signed)
Scheduled appointment per provider PAL. Patient is aware of the changes made to her upcoming appointment. 

## 2022-11-22 ENCOUNTER — Inpatient Hospital Stay: Payer: 59 | Attending: Medical | Admitting: Internal Medicine

## 2022-11-22 VITALS — BP 116/64 | HR 86 | Temp 98.1°F | Resp 18 | Ht 62.0 in | Wt 113.5 lb

## 2022-11-22 DIAGNOSIS — C787 Secondary malignant neoplasm of liver and intrahepatic bile duct: Secondary | ICD-10-CM | POA: Diagnosis not present

## 2022-11-22 DIAGNOSIS — C50212 Malignant neoplasm of upper-inner quadrant of left female breast: Secondary | ICD-10-CM | POA: Diagnosis present

## 2022-11-22 DIAGNOSIS — C7951 Secondary malignant neoplasm of bone: Secondary | ICD-10-CM | POA: Insufficient documentation

## 2022-11-22 DIAGNOSIS — Z79899 Other long term (current) drug therapy: Secondary | ICD-10-CM | POA: Diagnosis not present

## 2022-11-22 DIAGNOSIS — Z5112 Encounter for antineoplastic immunotherapy: Secondary | ICD-10-CM | POA: Diagnosis present

## 2022-11-22 DIAGNOSIS — Z170 Estrogen receptor positive status [ER+]: Secondary | ICD-10-CM | POA: Diagnosis not present

## 2022-11-22 DIAGNOSIS — C7931 Secondary malignant neoplasm of brain: Secondary | ICD-10-CM | POA: Insufficient documentation

## 2022-11-22 MED ORDER — DEXAMETHASONE 1 MG PO TABS: 1.0000 mg | ORAL_TABLET | Freq: Two times a day (BID) | ORAL | 1 refills | Status: DC

## 2022-11-22 NOTE — Progress Notes (Signed)
[facility] at [facility] [address]  [place], [place] [address] [phone]   Interval Evaluation  Date of Service: 11/22/22 Patient Name: Melissa [name] Patient MRN: [id number] Patient DOB: 1966/05/01 Provider: Henreitta Leber, MD  Identifying Statement:  Melissa [name] is a 57 y.o. 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 Ki-67 was 70% HER-2 was amplified with a ratio 2.91   07/12/2012 - 07/17/2013 Neo-Adjuvant Chemotherapy   TCH 6 followed by Herceptin maintenance   12/11/2012 Surgery   Left breast lumpectomy: 1.8 cm tumor 1 positive sentinel node, axillary lymph node dissection 02/08/2013 showed 0/13 lymph nodes   03/25/2013 - 05/06/2013 Radiation Therapy   Adjuvant radiation therapy   06/05/2013 - 07/20/2017 Anti-estrogen oral therapy   Tamoxifen 20 mg daily   07/27/2017 Relapse/Recurrence   MRI Brain: 3.4 x 2.9 x 2.9 cm RIGHT frontal lobe mass with imaging characteristics of solitary metastasis. Extensive vasogenic edema resulting in 9 mm RIGHT to LEFT midline shift. Equivocal very early LEFT ventricle entrapment.    08/04/2017 Surgery   Rt frontal brain resection: Poorly differentiated tumor IHC suggests breast primary ER and PR Positive   08/25/2017 - 09/04/2017 Radiation Therapy   Stereotactic radiation   09/18/2017 -  Anti-estrogen oral therapy   Lapatinib with letrozole   12/18/2017 - 12/19/2017 Radiation Therapy   New right parietal lobe metastases status post [facility]   08/27/2018 - 08/27/2020 Radiation Therapy   SRS to new brain metastases   05/09/2019 Relapse/Recurrence   Interval increase in size of the enhancing  nodular left internal mammary soft tissue 2.8 cm.  Redemonstrated enlarged supraclavicular, lower cervical and lower posterior cervical nodes unchanged.  Interval increase in the bony erosion of the posterior and lateral left third rib, increasing soft tissue lesion eroding the left sternal body 3.1 cm was 2.5 cm.  Bronchiectatic changes   06/19/2019 - 05/05/2020 Chemotherapy   ado-trastuzumab emtansine (KADCYLA)     06/15/2020 -  Chemotherapy   Xeloda, Tucatinib, Herceptin    04/12/2022 - 09/06/2022 Chemotherapy   Patient is on Treatment Plan : BREAST Trastuzumab IV (8/6) or SQ (600) D1 q21d     09/27/2022 -  Chemotherapy   Patient is on Treatment Plan : BREAST MAINTENANCE Trastuzumab IV (6) or SQ (600) D1 q21d x 13 cycles     11/08/2022 Miscellaneous   Adding low-dose neratinib (80 mg) to Herceptin   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/Initial Ca 1997, Brain Mets 2019  06/10/2019 Initial Diagnosis   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)   06/19/2019 - 05/05/2020 Chemotherapy   ado-trastuzumab emtansine (KADCYLA)     09/27/2022 -  Chemotherapy   Patient is on Treatment Plan : BREAST MAINTENANCE Trastuzumab IV (6) or SQ (600) D1 q21d x 13 cycles     Port-A-Cath in place   CNS Oncologic History 11/04/22: Post-op frx SRS L occipital Kathrynn Running) 10/06/22: Craniotomy, resection L [facility]ipital at [facility]), path is neoplasm 10/12/21: SRS to pons 16 Gy Kathrynn Running) 06/29/21: SRS to 2x L occipital 03/17/21: SRS x3 08/27/20: SRS x5 05/13/20: SRS x4  03/25/19: SRS right frontal 5 mm 09/04/18: SRS x2 right frontal  12/18/17: SRS R parietal 09/04/17: Post op SRS R frontal, 5 fxs 08/04/17: Craniotomy, resection R frontal met ([name])  Interval History: Melissa [name] presents today for follow up after completing pos-op SRS.  She describes recurrence of daily headaches since surgery.  Has been dosing Tylenol or Aleve daily.  Sleep has been poor at times.  Steroids had been  discontinued.  Her right sided visual symptoms have not recurred.  Otherwise no new or progressive changes.  H+P (08/02/22) Patient presents today for headache evaluation.  She describes pain "along the temples" mild to moderate, every day for the past month.  Prior to this she had very sporadic migraine type headaches, which these differ from.  No associated neurologic deficits.  She does dose codeine based cough syrup every night (for the past year), and has chronic sleep impairment.  Decadron 2mg  twice per day, started last week, has not helped any symptoms.  Also describes some "shadowy visual loss" on the right side, at times.  Continues on herceptin with Dr. Pamelia Hoit.  Medications: Current Outpatient Medications on File Prior to Visit  Medication Sig Dispense Refill   acetaminophen (TYLENOL) 325 MG tablet Take 2 tablets (650 mg total) by mouth every 6 (six) hours as needed for mild pain (or Fever >/= 101). 12 tablet 0   albuterol (VENTOLIN HFA) 108 (90 Base) MCG/ACT inhaler Inhale 2 puffs into the lungs every 6 (six) hours as needed for wheezing or shortness of breath (cough). 18 g 5   Cholecalciferol 250 MCG (10000 UT) CAPS Take by mouth.     Fluticasone-Salmeterol,sensor, (AIRDUO DIGIHALER) 113-14 MCG/ACT AEPB Inhale 1 puff into the lungs 2 (two) times daily. 1 each 4   GARLIC PO Take by mouth.     levETIRAcetam (KEPPRA) 500 MG tablet Take by mouth.     lidocaine-prilocaine (EMLA) cream APPLY EXTERNALLY TO THE AFFECTED AREA 1 TIME 30 g 3   Multiple Vitamins-Minerals (EMERGEN-C VITAMIN C PO) Take by mouth.     Neratinib Maleate (NERLYNX) 40 MG tablet Take 2 tablets (80 mg total) by mouth daily. Take with food. 60 tablet 0   OIL OF OREGANO PO Take 500 mg by mouth 2 (two) times daily.     oxyCODONE (OXY IR/ROXICODONE) 5 MG immediate release tablet Take by mouth.     predniSONE (DELTASONE) 10 MG tablet Take by mouth.     SYMBICORT 160-4.5 MCG/ACT inhaler Inhale 2 puffs into the lungs 2 (two)  times daily.     venlafaxine XR (EFFEXOR-XR) 37.5 MG 24 hr capsule Take 37.5 mg by mouth daily.     Vitamin E (VITAMIN E/D-ALPHA NATURAL) 268 MG (400 UNIT) CAPS Take by mouth.     voriconazole (VFEND) 200 MG tablet Take 1 tablet (200 mg total) by mouth 2 (two) times daily. 60 tablet 11   Current Facility-Administered Medications on File Prior to Visit  Medication Dose Route Frequency Provider Last Rate Last Admin   alteplase (CATHFLO ACTIVASE) injection 2 mg  2 mg Intracatheter Once PRN Serena Croissant, MD       sodium chloride flush (NS) 0.9 % injection 10 mL  10 mL Intracatheter PRN Serena Croissant, MD   10 mL at 07/06/21 1428    Allergies:  Allergies  Allergen Reactions   Aspirin Anaphylaxis and Shortness Of Breath    THROAT CLOSES   Protonix [Pantoprazole] Nausea Only and Other (See Comments)    Also caused a "film in the mouth" and caused chest pressure   Doxycycline Other (  See Comments)    Hallucinations, headaches   Promethazine-Codeine Cough    Worsening cough and insomnia    Sulfamethoxazole-Trimethoprim Other (See Comments)   Iodinated Contrast Media Rash    Patient allergic to contrast used in radiation oncology for CT simulation    Past Medical History:  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   Asthma    Breast cancer (HCC)    Bronchiectasis (HCC)    Bronchiolitis    Cancer (HCC)    breast cancer 2014   Cancer, metastatic to liver (HCC)    2021   Complication of anesthesia    bp dropped + desat    COPD (chronic obstructive pulmonary disease) (HCC)    Dyspnea    DOE   GERD (gastroesophageal reflux disease)    H/O coccidioidomycosis    was reason for lung lobectomy   Headache(784.0)    due to eye strain or not eating   History of anemia    no current problem   History of asthma    as a child   History of breast cancer 2014   left   History of chemotherapy    finished 07/17/2013    History of hiatal hernia    AGE 89   Hx of radiation therapy 03/25/13-05/06/13   left breast 5000 cGy/25 sessions, left breast boost 1000 cGy/5 sessions   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   Pneumonia    LAST FLARE UP 01/2018   Rash 02/22/2021   Runny nose 07/30/2013   clear drainage   Smoker 05/24/2021   Wears dentures    upper   Past Surgical History:  Past Surgical History:  Procedure Laterality Date   APPLICATION OF CRANIAL NAVIGATION N/A 08/04/2017   Procedure: APPLICATION OF CRANIAL NAVIGATION;  Surgeon: [name], Loura Halt, MD;  Location: [facility];  Service: Neurosurgery;  Laterality: N/A;   AXILLARY LYMPH NODE DISSECTION Left 02/08/2013   Procedure: LEFT AXILLARY DISSECTION;  Surgeon: Mariella Saa, MD;  Location: [facility];  Service: General;  Laterality: Left;   BREAST CYST EXCISION Right 2006   BREAST LUMPECTOMY Left 2014   BREAST LUMPECTOMY WITH NEEDLE LOCALIZATION AND AXILLARY SENTINEL LYMPH NODE BX Left 12/31/2012   Procedure: NEEDLE LOCALIZATION LEFT BREAST LUMPECTOMY AND LEFT AXILLARY SENTENIAL LYMPH NODE BX;  Surgeon: Mariella Saa, MD;  Location: [facility];  Service: General;  Laterality: Left;   BRONCHIAL BRUSHINGS  03/17/2020   Procedure: BRONCHIAL BRUSHINGS;  Surgeon: Josephine Igo, DO;  Location: [facility];  Service: Pulmonary;;   BRONCHIAL WASHINGS  03/17/2020   Procedure: BRONCHIAL WASHINGS;  Surgeon: Josephine Igo, DO;  Location: [facility];  Service: Pulmonary;;   CESAREAN SECTION  1995/1996   CRANIOTOMY Right 08/04/2017   Procedure: Right Frontal craniotomy for resection of tumor with stereotactic navigation;  Surgeon: [name], Loura Halt, MD;  Location: [facility] OR;  Service: Neurosurgery;  Laterality: Right;  Right Frontal craniotomy for resection of tumor with stereotactic navigation   IR CV LINE INJECTION  02/19/2021   IR CV LINE INJECTION  11/12/2021   IR IMAGING GUIDED PORT INSERTION  05/31/2019   LUNG LOBECTOMY Left 05/1996    upper lobe - due to [facility] Fever   PORT-A-CATH REMOVAL Right 08/02/2013   Procedure: REMOVAL PORT-A-CATH;  Surgeon: Mariella Saa, MD;  Location: [facility];  Service: General;  Laterality: Right;   PORTACATH PLACEMENT  07/02/2012   Procedure: INSERTION PORT-A-CATH;  Surgeon: Mariella Saa, MD;  Location: [facility];  Service: General;  Laterality: N/A;  right   VIDEO BRONCHOSCOPY N/A 03/17/2020   Procedure: VIDEO BRONCHOSCOPY;  Surgeon: Josephine Igo, DO;  Location: [facility];  Service: Pulmonary;  Laterality: N/A;   VIDEO BRONCHOSCOPY WITH ENDOBRONCHIAL ULTRASOUND N/A 05/23/2018   Procedure: VIDEO BRONCHOSCOPY WITH ENDOBRONCHIAL ULTRASOUND;  Surgeon: Josephine Igo, DO;  Location: [facility];  Service: Thoracic;  Laterality: N/A;   Social History:  Social History   Socioeconomic History   Marital status: Married    Spouse name: Not on file   Number of children: 2   Years of education: Not on file   Highest education level: Not on file  Occupational History   Occupation: Investment banker, corporate  Tobacco Use   Smoking status: Every Day    Packs/day: 0.30    Years: 38.00    Additional pack years: 0.00    Total pack years: 11.40    Types: Cigarettes   Smokeless tobacco: Never   Tobacco comments:    10 cigarettes smoked daily 10/19/20 [facility]   Vaping Use   Vaping Use: Never used  Substance and Sexual Activity   Alcohol use: No   Drug use: No   Sexual activity: Not Currently  Other Topics Concern   Not on file  Social History Narrative   Not on file   Social Determinants of Health   Financial Resource Strain: Not on file  Food Insecurity: No Food Insecurity (10/18/2022)   Hunger Vital Sign    Worried About Running Out of Food in the Last Year: Never true    Ran Out of Food in the Last Year: Never true  Transportation Needs: No Transportation Needs (10/18/2022)   PRAPARE - Administrator, Civil Service (Medical): No    Lack of  Transportation (Non-Medical): No  Physical Activity: Not on file  Stress: Not on file  Social Connections: Not on file  Intimate Partner Violence: Not At Risk (10/18/2022)   Humiliation, Afraid, Rape, and Kick questionnaire    Fear of Current or Ex-Partner: No    Emotionally Abused: No    Physically Abused: No    Sexually Abused: No   Family History:  Family History  Problem Relation Age of Onset   Emphysema Mother        was a smoker   Heart disease Mother    Melanoma Mother        dx in her 68s   Breast cancer Mother 10   Asthma Brother    Breast cancer Cousin        mother's maternal cousin; dx in her 62s    Review of Systems: Constitutional: Doesn't report fevers, chills or abnormal weight loss Eyes: Doesn't report blurriness of vision Ears, nose, mouth, throat, and face: Doesn't report sore throat Respiratory: Doesn't report cough, dyspnea or wheezes Cardiovascular: Doesn't report palpitation, chest discomfort  Gastrointestinal:  Doesn't report nausea, constipation, diarrhea GU: Doesn't report incontinence Skin: Doesn't report skin rashes Neurological: Per HPI Musculoskeletal: Doesn't report joint pain Behavioral/Psych: Doesn't report anxiety  Physical Exam: Vitals:   11/22/22 0959  BP: 116/64  Pulse: 86  Resp: 18  Temp: 98.1 F (36.7 C)  SpO2: 100%   KPS: 80. General: Alert, cooperative, pleasant, in no acute distress Head: Normal EENT: No conjunctival injection or scleral icterus.  Lungs: Resp effort normal Cardiac: Regular rate Abdomen: Non-distended abdomen Skin: No rashes  cyanosis or petechiae. Extremities: No clubbing or edema  Neurologic Exam: Mental Status: Awake, alert, attentive to examiner. Oriented to self and environment. Language is fluent with intact comprehension.  Cranial Nerves: Visual acuity is grossly normal. Visual fields are full. Extra-ocular movements intact. No ptosis. Face is symmetric Motor: Tone and bulk are normal. Power is  full in both arms and legs. Reflexes are symmetric, no pathologic reflexes present.  Sensory: Intact to light touch Gait: Normal.   Labs: I have reviewed the data as listed    Component Value Date/Time   NA 139 05/24/2022 1031   NA 142 08/12/2014 0956   K 4.5 05/24/2022 1031   K 4.6 08/12/2014 0956   CL 104 05/24/2022 1031   CL 100 12/20/2012 1509   CO2 31 05/24/2022 1031   CO2 26 08/12/2014 0956   GLUCOSE 56 (L) 05/24/2022 1031   GLUCOSE 94 08/12/2014 0956   GLUCOSE 98 12/20/2012 1509   BUN 11 05/24/2022 1031   BUN 10.1 08/12/2014 0956   CREATININE 0.82 05/24/2022 1031   CREATININE 0.88 03/11/2019 1057   CREATININE 0.9 08/12/2014 0956   CALCIUM 10.1 05/24/2022 1031   CALCIUM 9.3 08/12/2014 0956   PROT 8.2 (H) 05/24/2022 1031   PROT 7.1 08/12/2014 0956   ALBUMIN 4.1 05/24/2022 1031   ALBUMIN 3.8 08/12/2014 0956   AST 14 (L) 05/24/2022 1031   AST 15 08/12/2014 0956   ALT 8 05/24/2022 1031   ALT 11 08/12/2014 0956   ALKPHOS 74 05/24/2022 1031   ALKPHOS 68 08/12/2014 0956   BILITOT 0.2 (L) 05/24/2022 1031   BILITOT 0.25 08/12/2014 0956   GFRNONAA >60 05/24/2022 1031   GFRNONAA 76 03/11/2019 1057   GFRAA >60 03/24/2020 1012   GFRAA >60 02/11/2020 0836   GFRAA 88 03/11/2019 1057   Lab Results  Component Value Date   WBC 11.3 (H) 05/24/2022   NEUTROABS 7.7 05/24/2022   HGB 12.1 05/24/2022   HCT 35.7 (L) 05/24/2022   MCV 94.7 05/24/2022   PLT 406 (H) 05/24/2022    Imaging:  MR Brain W Wo Contrast  Result Date: 10/26/2022 CLINICAL DATA:  Brain metastasis. Assess treatment response. Recent surgery. EXAM: MRI HEAD WITHOUT AND WITH CONTRAST TECHNIQUE: Multiplanar, multiecho pulse sequences of the brain and surrounding structures were obtained without and with intravenous contrast. CONTRAST:  5 ML [employer] COMPARISON:  MR head without and with contrast at [facility] 10/07/2022 and 10/05/2022. MR head without and with contrast at [facility] 09/14/2022  FINDINGS: Brain: The patient is status post left parietal craniotomy for resection of the medial occipital metastasis. Blood products are present within the surgical cavity. Increased peripheral enhancement of the cavity is likely reactive in nature. There is some nodular enhancement along the inferior and medial aspect of the cavity which raises concern for residual or recurrent tumor. An 8 mm bilobed enhancing metastasis in the superior vermis is stable. A right paramedian pontine metastasis is slightly increased in size compared 2 March 6 the, now measuring 10 x 6.5 mm, compared with 8 x 5.5 mm previously. Enhancing tumor at the right temporal tip is slightly more prominent than on the prior study. It measures 32 x 24 mm on the sagittal postcontrast series compared with 32 x 21 mm previously. Vasogenic edema has increased slightly with further extension into the posterior limb of the internal capsule and more prominent edema within the right external capsule. The right frontal resection cavity and surrounding edema is stable. Enhancing tumor in the  high medial right frontal lobe is stable measuring 12 mm. No new enhancing foci are present. Diffusion-weighted images demonstrate no acute or subacute infarct. Mass effect from right temporal vasogenic edema has increased. Midline shift now measures 5.2 mm compared with 2.5 mm on March 6th. Vascular: Flow is present in the major intracranial arteries. Skull and upper cervical spine: The craniocervical junction is normal. Upper cervical spine is within normal limits. Marrow signal is unremarkable. Sinuses/Orbits: The paranasal sinuses and mastoid air cells are clear. The globes and orbits are within normal limits. IMPRESSION: 1. Status post left parietal craniotomy for resection of the medial left occipital metastasis. There is some nodular enhancement along the inferior and medial aspect of the cavity which raises concern for residual or recurrent tumor. Continued  close attention on follow-up scans. 2. Slight increase in size of right paramedian pontine metastasis. 3. Slight increase in size of right temporal tip tumor with increased vasogenic edema and mass effect. Midline shift has increased from 2.5 mm to 5.2 mm. 4. Stable right frontal resection cavity and surrounding edema. 5. Stable 8 mm bilobed enhancing metastasis in the superior vermis. 6. No new enhancing foci. Electronically Signed   By: Marin Roberts M.D.   On: 10/26/2022 14:53    [facility] Clinician Interpretation: I have personally reviewed the radiological images as listed.  My interpretation, in the context of the patient's clinical presentation, is progressive disease   Assessment/Plan Metastasis to brain [facility])  Melissa [name] is clinically stable today from focal neurologic standpiont.  MRI brain demonstrates progression of enhancing mass within right anterior temporal, suspicious for neoplasm given recent CNS histology.  This region was treated with [facility] September 2022.   We discussed and recommended craniotomy, resection for this site of progression. We will reach out to Dr. Louie Casa team for an evaluation.  They are agreeable with this plan.  Decadron will resume 1mg  daily given chronic daily headache, analgesia overuse.  We also counseled on sleep hygiene.  We appreciate the opportunity to participate in the care of Melissa [name].   We ask that Melissa [name] return to clinic following resection for path review, or sooner as needed.  All questions were answered. The patient knows to call the clinic with any problems, questions or concerns. No barriers to learning were detected.  The total time spent in the encounter was 40 minutes and more than 50% was on counseling and review of test results   Henreitta Leber, MD Medical Director of Neuro-Oncology [facility] at [facility] 11/22/22 10:00 AM

## 2022-11-24 ENCOUNTER — Telehealth: Payer: Self-pay | Admitting: Pharmacy Technician

## 2022-11-24 ENCOUNTER — Ambulatory Visit: Payer: 59 | Admitting: Pharmacist

## 2022-11-24 NOTE — Telephone Encounter (Signed)
Oral Oncology Patient Advocate Encounter  Received notification that patient will not continue with Nerlynx therapy. Email sent to [facility] pharmacy to notify and prevent any future refill calls.   Jinger Neighbors, CPhT-Adv Oncology Pharmacy Patient Advocate [facility] Direct Number: [phone]  Fax: [phone]

## 2022-11-24 NOTE — Progress Notes (Signed)
[facility]       Telephone: [phone]?Fax: [phone]   Oncology Clinical Pharmacist Practitioner Progress Note  Melissa [name] is a 57 y.o. female with a diagnosis of metastatic breast cancer currently on trastuzumab under the care of Dr. Serena Croissant.   I connected with Melissa [name] today by telephone and verified that I was speaking with the correct person using two patient identifiers. I discussed the limitations, risks, security and privacy concerns of performing an evaluation and management service by telemedicine and the availability of in-person appointments. The patient/caregiver expressed understanding and agreed to proceed.  Other persons participating in the visit and their role in the encounter: none   Patient's location: home  Provider's location: clinic  Clinical pharmacy reached out to Melissa [name] about her upcoming appointment on 11/29/22 and labs since she had recently restarted neratinib. She started 2 tabs (80 mg) daily on Monday, 11/21/22, in the morning after being prescribed by Dr. Pamelia Hoit. By the afternoon, she had already experienced several episodes of loose stools. She states she took loperamide 2 tabs which resolved the loose stools and then did not feel comfortable taking more neratinib.   We discussed this report with Dr. Pamelia Hoit and he felt at this time, it would be better to discuss other treatment options and stopping neratinib. He was okay with her continuing trastuzumab for now which is next due on 11/29/22 with labs. She was planning on seeing clinical pharmacy at that time as well. However, because she will be discussing next line therapy, she will see Dr. Pamelia Hoit instead. She was okay having a telephone conversation with him if needed. We have removed neratinib from her medication list at this time. Scheduling is aware and will contact the patient with the updates.  She has tried several different treatment options including lapatinib,  trastuzumab emtansine, capecitabine, tucatinib, and neratinib. She saw Dr. Barbaraann Cao from neuro onc on 11/22/22. She continues on voriconazole for aspergillosis and follows with ID.  Melissa [name] participated in the discussion, expressed understanding, and voiced agreement with the above plan. All questions were answered to her satisfaction. The patient was advised to contact the clinic at [phone] with any questions or concerns prior to her return visit.  Clinical pharmacy will continue to support Melissa Broad Bowermaster and Dr. Serena Croissant as needed.  Josealberto Montalto A. Odetta Pink, PharmD, BCOP, CPP  Anselm Lis, RPH-CPP,  11/24/2022  1:35 PM   **Disclaimer: This note was dictated with voice recognition software. Similar sounding words can inadvertently be transcribed and this note may contain transcription errors which may not have been corrected upon publication of note.**

## 2022-11-24 NOTE — Addendum Note (Signed)
Encounter addended by: Marcello Fennel, PA-C on: 11/24/2022 3:50 PM  Actions taken: Clinical Note Signed

## 2022-11-28 ENCOUNTER — Telehealth: Payer: Self-pay | Admitting: Hematology and Oncology

## 2022-11-28 NOTE — Telephone Encounter (Signed)
Scheduled and rescheduled appointments per staff message. Patient is aware of the appointments made and the changes made to her upcoming appointments.

## 2022-11-29 ENCOUNTER — Inpatient Hospital Stay (HOSPITAL_BASED_OUTPATIENT_CLINIC_OR_DEPARTMENT_OTHER): Payer: 59 | Admitting: Hematology and Oncology

## 2022-11-29 ENCOUNTER — Ambulatory Visit: Payer: 59

## 2022-11-29 ENCOUNTER — Inpatient Hospital Stay: Payer: 59

## 2022-11-29 ENCOUNTER — Inpatient Hospital Stay: Payer: 59 | Admitting: Pharmacist

## 2022-11-29 VITALS — BP 107/60 | HR 77 | Temp 96.8°F | Resp 16

## 2022-11-29 DIAGNOSIS — Z5112 Encounter for antineoplastic immunotherapy: Secondary | ICD-10-CM | POA: Diagnosis not present

## 2022-11-29 DIAGNOSIS — C50212 Malignant neoplasm of upper-inner quadrant of left female breast: Secondary | ICD-10-CM

## 2022-11-29 DIAGNOSIS — Z171 Estrogen receptor negative status [ER-]: Secondary | ICD-10-CM

## 2022-11-29 DIAGNOSIS — Z95828 Presence of other vascular implants and grafts: Secondary | ICD-10-CM

## 2022-11-29 DIAGNOSIS — C50912 Malignant neoplasm of unspecified site of left female breast: Secondary | ICD-10-CM

## 2022-11-29 LAB — CMP (CANCER CENTER ONLY)
ALT: 14 U/L (ref 0–44)
AST: 13 U/L — ABNORMAL LOW (ref 15–41)
Albumin: 3.6 g/dL (ref 3.5–5.0)
Alkaline Phosphatase: 62 U/L (ref 38–126)
Anion gap: 6 (ref 5–15)
BUN: 19 mg/dL (ref 6–20)
CO2: 28 mmol/L (ref 22–32)
Calcium: 9.1 mg/dL (ref 8.9–10.3)
Chloride: 106 mmol/L (ref 98–111)
Creatinine: 0.76 mg/dL (ref 0.44–1.00)
GFR, Estimated: >60 mL/min (ref >60)
Glucose, Bld: 92 mg/dL (ref 70–99)
Potassium: 3.6 mmol/L (ref 3.5–5.1)
Sodium: 140 mmol/L (ref 135–145)
Total Bilirubin: 0.2 mg/dL — ABNORMAL LOW (ref 0.3–1.2)
Total Protein: 6.5 g/dL (ref 6.5–8.1)

## 2022-11-29 LAB — CBC WITH DIFFERENTIAL (CANCER CENTER ONLY)
Abs Immature Granulocytes: 0.1 10*3/uL — ABNORMAL HIGH (ref 0.00–0.07)
Basophils Absolute: 0.1 10*3/uL (ref 0.0–0.1)
Basophils Relative: 1 %
Eosinophils Absolute: 0.3 10*3/uL (ref 0.0–0.5)
Eosinophils Relative: 3 %
HCT: 32.2 % — ABNORMAL LOW (ref 36.0–46.0)
Hemoglobin: 10.5 g/dL — ABNORMAL LOW (ref 12.0–15.0)
Immature Granulocytes: 1 %
Lymphocytes Relative: 21 %
Lymphs Abs: 2.6 10*3/uL (ref 0.7–4.0)
MCH: 30.6 pg (ref 26.0–34.0)
MCHC: 32.6 g/dL (ref 30.0–36.0)
MCV: 93.9 fL (ref 80.0–100.0)
Monocytes Absolute: 1 10*3/uL (ref 0.1–1.0)
Monocytes Relative: 9 %
Neutro Abs: 8 10*3/uL — ABNORMAL HIGH (ref 1.7–7.7)
Neutrophils Relative %: 65 %
Platelet Count: 247 10*3/uL (ref 150–400)
RBC: 3.43 MIL/uL — ABNORMAL LOW (ref 3.87–5.11)
RDW: 16.6 % — ABNORMAL HIGH (ref 11.5–15.5)
WBC Count: 12.1 10*3/uL — ABNORMAL HIGH (ref 4.0–10.5)
nRBC: 0 % (ref 0.0–0.2)

## 2022-11-29 MED ORDER — DIPHENHYDRAMINE HCL 25 MG PO CAPS
25.0000 mg | ORAL_CAPSULE | Freq: Once | ORAL | Status: AC
  Administered 2022-11-29: 25 mg via ORAL
  Filled 2022-11-29: qty 1

## 2022-11-29 MED ORDER — HEPARIN SOD (PORK) LOCK FLUSH 100 UNIT/ML IV SOLN
500.0000 [IU] | Freq: Once | INTRAVENOUS | Status: AC | PRN
  Administered 2022-11-29: 500 [IU]

## 2022-11-29 MED ORDER — SODIUM CHLORIDE 0.9% FLUSH
10.0000 mL | INTRAVENOUS | Status: DC | PRN
  Administered 2022-11-29: 10 mL

## 2022-11-29 MED ORDER — SODIUM CHLORIDE 0.9 % IV SOLN: Freq: Once | INTRAVENOUS | Status: AC

## 2022-11-29 MED ORDER — ACETAMINOPHEN 325 MG PO TABS
650.0000 mg | ORAL_TABLET | Freq: Once | ORAL | Status: AC
  Administered 2022-11-29: 650 mg via ORAL
  Filled 2022-11-29: qty 2

## 2022-11-29 MED ORDER — TRASTUZUMAB-ANNS CHEMO 150 MG IV SOLR
6.0000 mg/kg | Freq: Once | INTRAVENOUS | Status: AC
  Administered 2022-11-29: 300 mg via INTRAVENOUS
  Filled 2022-11-29: qty 14.29

## 2022-11-29 NOTE — Assessment & Plan Note (Signed)
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  SRS brain: 08/25/2017-09/04/2017 2. Anti Her 2 therapy with Lapatinib started 09/17/2017-05/14/2019: Stopped for progression 3.  I discontinued tamoxifen and started her on letrozole 2.5 mg daily.  05/14/2019 stopped for progression 4.  Stereotactic radiosurgery 12/19/2017 to the new right parietal lobe metastases. 5.  Kadcyla: Received 16 cycles discontinued 05/05/2020 6.  Tucatinib Xeloda: Discontinued because of hand-foot syndrome 06/11/2020-12/15/2020 7. Herceptin/Neratinib: unable to tolerate due to diarrhea and weight loss 8.  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  She and her husband are getting therapy. Resection of one of the lesions in the occipital lobe on 10/06/2022 at [facility]: ER 0%, PR 0%, HER2 3+ positive   Continue with every 3-week Herceptin next scans will be  done in June.  Neratinib toxicities: Patient could not even tolerate 2 tablets of neratinib and she had to discontinue it.  Therefore she is intolerant to these TKI's.

## 2022-11-29 NOTE — Progress Notes (Signed)
HEMATOLOGY-ONCOLOGY TELEPHONE VISIT PROGRESS NOTE  I connected with our patient on 11/29/22 at  2:00 PM EDT by telephone and verified that I am speaking with the correct person using two identifiers.  I discussed the limitations, risks, security and privacy concerns of performing an evaluation and management service by telephone and the availability of in person appointments.  I also discussed with the patient that there may be a patient responsible charge related to this service. The patient expressed understanding and agreed to proceed.   History of Present Illness:  Melissa [name] is a 57 y.o. with the above-mentioned history of metastatic breast cancer currently on treatment with Xeloda and Herceptin. She presents to the clinic today for a telephone follow-up.  She could not tolerate neratinib even at 2 tablets because she had profound diarrhea soon after starting it.  She is finally better after stopping the neratinib.  Oncology History  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 Ki-67 was 70% HER-2 was amplified with a ratio 2.91   07/12/2012 - 07/17/2013 Neo-Adjuvant Chemotherapy   TCH 6 followed by Herceptin maintenance   12/11/2012 Surgery   Left breast lumpectomy: 1.8 cm tumor 1 positive sentinel node, axillary lymph node dissection 02/08/2013 showed 0/13 lymph nodes   03/25/2013 - 05/06/2013 Radiation Therapy   Adjuvant radiation therapy   06/05/2013 - 07/20/2017 Anti-estrogen oral therapy   Tamoxifen 20 mg daily   07/27/2017 Relapse/Recurrence   MRI Brain: 3.4 x 2.9 x 2.9 cm RIGHT frontal lobe mass with imaging characteristics of solitary metastasis. Extensive vasogenic edema resulting in 9 mm RIGHT to LEFT midline shift. Equivocal very early LEFT ventricle  entrapment.    08/04/2017 Surgery   Rt frontal brain resection: Poorly differentiated tumor IHC suggests breast primary ER and PR Positive   08/25/2017 - 09/04/2017 Radiation Therapy   Stereotactic radiation   09/18/2017 -  Anti-estrogen oral therapy   Lapatinib with letrozole   12/18/2017 - 12/19/2017 Radiation Therapy   New right parietal lobe metastases status post [facility]   08/27/2018 - 08/27/2020 Radiation Therapy   SRS to new brain metastases   05/09/2019 Relapse/Recurrence   Interval increase in size of the enhancing nodular left internal mammary soft tissue 2.8 cm.  Redemonstrated enlarged supraclavicular, lower cervical and lower posterior cervical nodes unchanged.  Interval increase in the bony erosion of the posterior and lateral left third rib, increasing soft tissue lesion eroding the left sternal body 3.1 cm was 2.5 cm.  Bronchiectatic changes   06/19/2019 - 05/05/2020 Chemotherapy   ado-trastuzumab emtansine (KADCYLA)     06/15/2020 -  Chemotherapy   Xeloda, Tucatinib, Herceptin    04/12/2022 - 09/06/2022 Chemotherapy   Patient is on Treatment Plan : BREAST Trastuzumab IV (8/6) or SQ (600) D1 q21d     09/27/2022 -  Chemotherapy   Patient is on Treatment Plan : BREAST MAINTENANCE Trastuzumab IV (6) or SQ (600) D1 q21d x 13 cycles     11/08/2022 Miscellaneous   Adding low-dose neratinib (80 mg) to Herceptin   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/Initial Ca 1997, Brain Mets 2019  06/10/2019 Initial Diagnosis   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)   06/19/2019 - 05/05/2020 Chemotherapy   ado-trastuzumab emtansine (KADCYLA)     09/27/2022 -  Chemotherapy   Patient is on Treatment Plan : BREAST MAINTENANCE Trastuzumab IV (6) or SQ (600) D1 q21d x 13 cycles     Port-A-Cath in place    REVIEW OF SYSTEMS:   Constitutional: Denies fevers, chills or abnormal weight loss All other systems were reviewed with the patient and are negative. 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  SRS brain: 08/25/2017-09/04/2017 2. Anti Her 2 therapy with Lapatinib started 09/17/2017-05/14/2019: Stopped for progression 3.  I discontinued tamoxifen and started her on letrozole 2.5 mg daily.  05/14/2019 stopped for progression 4.  Stereotactic radiosurgery 12/19/2017 to the new right parietal lobe metastases. 5.  Kadcyla: Received 16 cycles discontinued 05/05/2020 6.  Tucatinib Xeloda: Discontinued because of hand-foot syndrome 06/11/2020-12/15/2020 7. Herceptin/Neratinib: unable to tolerate due to diarrhea and weight loss 8.  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  She and her husband are getting therapy. Resection of  one of the lesions in the occipital lobe on 10/06/2022 at [facility]: ER 0%, PR 0%, HER2 3+ positive   Continue with every 3-week Herceptin next scans will be done in June.  Neratinib toxicities: Patient could not even tolerate 2 tablets of neratinib and she had to discontinue it.  Therefore she is intolerant to these TKI's. Profound diarrhea.  We will obtain CT chest abdomen pelvis before her next appointment in June.  I discussed the assessment and treatment plan with the patient. The patient was provided an opportunity to ask questions and all were answered. The patient agreed with the plan and demonstrated an understanding of the instructions. The patient was advised to call back or seek an in-person evaluation if the symptoms worsen or if the condition fails to improve as anticipated.   I provided 12 minutes of non-face-to-face time during this encounter.  This includes time for charting and coordination of care   Tamsen Meek, MD  I Janan Ridge am acting as a scribe for Dr.Kenshin Splawn  I have reviewed the above documentation for accuracy and completeness, and I agree with the above.

## 2022-11-29 NOTE — Patient Instructions (Signed)
[facility]  Discharge Instructions: Thank you for choosing [facility] to provide your oncology and hematology care.   If you have a lab appointment with the Cancer Center, please go directly to the Cancer Center and check in at the registration area.   Wear comfortable clothing and clothing appropriate for easy access to any Portacath or PICC line.   We strive to give you quality time with your provider. You may need to reschedule your appointment if you arrive late (15 or more minutes).  Arriving late affects you and other patients whose appointments are after yours.  Also, if you miss three or more appointments without notifying the office, you may be dismissed from the clinic at the provider's discretion.      For prescription refill requests, have your pharmacy contact our office and allow 72 hours for refills to be completed.    Today you received the following chemotherapy and/or immunotherapy agents herceptin      To help prevent nausea and vomiting after your treatment, we encourage you to take your nausea medication as directed.  BELOW ARE SYMPTOMS THAT SHOULD BE REPORTED IMMEDIATELY: *FEVER GREATER THAN 100.4 F (38 C) OR HIGHER *CHILLS OR SWEATING *NAUSEA AND VOMITING THAT IS NOT CONTROLLED WITH YOUR NAUSEA MEDICATION *UNUSUAL SHORTNESS OF BREATH *UNUSUAL BRUISING OR BLEEDING *URINARY PROBLEMS (pain or burning when urinating, or frequent urination) *BOWEL PROBLEMS (unusual diarrhea, constipation, pain near the anus) TENDERNESS IN MOUTH AND THROAT WITH OR WITHOUT PRESENCE OF ULCERS (sore throat, sores in mouth, or a toothache) UNUSUAL RASH, SWELLING OR PAIN  UNUSUAL VAGINAL DISCHARGE OR ITCHING   Items with * indicate a potential emergency and should be followed up as soon as possible or go to the Emergency Department if any problems should occur.  Please show the CHEMOTHERAPY ALERT CARD or IMMUNOTHERAPY ALERT CARD at  check-in to the Emergency Department and triage nurse.  Should you have questions after your visit or need to cancel or reschedule your appointment, please contact [facility]  Dept: [phone]  and follow the prompts.  Office hours are 8:00 a.m. to 4:30 p.m. Monday - Friday. Please note that voicemails left after 4:00 p.m. may not be returned until the following business day.  We are closed weekends and major holidays. You have access to a nurse at all times for urgent questions. Please call the main number to the clinic Dept: [phone] and follow the prompts.   For any non-urgent questions, you may also contact your provider using MyChart. We now offer e-Visits for anyone 18 and older to request care online for non-urgent symptoms. For details visit [email or web address].   Also download the MyChart app! Go to the app store, search "MyChart", open the app, select , and log in with your MyChart username and password.   

## 2022-11-30 ENCOUNTER — Ambulatory Visit: Payer: 59 | Admitting: Behavioral Health

## 2022-12-02 ENCOUNTER — Other Ambulatory Visit: Payer: Self-pay

## 2022-12-06 ENCOUNTER — Ambulatory Visit
Admission: RE | Admit: 2022-12-06 | Discharge: 2022-12-06 | Disposition: A | Discharge status: Home / Self Care | Payer: 59 | Source: Ambulatory Visit | Attending: Adult Health | Admitting: Adult Health

## 2022-12-06 DIAGNOSIS — C7931 Secondary malignant neoplasm of brain: Secondary | ICD-10-CM

## 2022-12-06 NOTE — Progress Notes (Signed)
  Radiation Oncology         [phone] ________________________________  Name: Melissa [name] MRN: [id number]  Date of Service: 12/06/2022  DOB: September 20, 1965  Post Treatment Telephone Note  Diagnosis:   57 yo woman with ER+ Her2+ cancer of the upper inner quadrant of the left breast with metastatic disease to the brain s/p resection of a left occipital lesion, measuring 26 mm at the site of previous SRS/metastasis and a new 3 mm brain met in the right parietal cortex    Intent: Palliative (as documented in provider EOT note)   The patient was available for call today.   Symptoms of fatigue have improved since completing therapy.  Symptoms of skin changes have improved since completing therapy.  The patient was encouraged to avoid sun exposure in the area of prior treatment for up to one year following radiation with either sunscreen or by the style of clothing worn in the sun.  The patient has scheduled follow up with her medical oncologist Dr. Pamelia Hoit for ongoing surveillance, and was encouraged to call if she develops concerns or questions regarding radiation.  This concludes the interview.   Ruel Favors, LPN

## 2022-12-07 ENCOUNTER — Encounter: Payer: Self-pay | Admitting: Hematology and Oncology

## 2022-12-12 ENCOUNTER — Telehealth: Payer: Self-pay | Admitting: Hematology and Oncology

## 2022-12-12 NOTE — Telephone Encounter (Signed)
Scheduled appointments per WQ. Patient is aware of all made appointments. 

## 2022-12-13 ENCOUNTER — Other Ambulatory Visit: Payer: Self-pay

## 2022-12-13 ENCOUNTER — Ambulatory Visit (HOSPITAL_COMMUNITY)
Admission: RE | Admit: 2022-12-13 | Discharge: 2022-12-13 | Disposition: A | Discharge status: Home / Self Care | Payer: 59 | Source: Ambulatory Visit | Attending: Hematology and Oncology | Admitting: Hematology and Oncology

## 2022-12-13 DIAGNOSIS — F17200 Nicotine dependence, unspecified, uncomplicated: Secondary | ICD-10-CM | POA: Diagnosis not present

## 2022-12-13 DIAGNOSIS — Z5181 Encounter for therapeutic drug level monitoring: Secondary | ICD-10-CM | POA: Diagnosis not present

## 2022-12-13 DIAGNOSIS — Z79899 Other long term (current) drug therapy: Secondary | ICD-10-CM | POA: Diagnosis not present

## 2022-12-13 LAB — ECHOCARDIOGRAM COMPLETE
AR max vel: 2.34 cm2
AV Area VTI: 2.05 cm2
AV Area mean vel: 1.71 cm2
AV Mean grad: 4 mmHg
AV Peak grad: 6.1 mmHg
Ao pk vel: 1.23 m/s
Area-P 1/2: 3.42 cm2
S' Lateral: 2.8 cm

## 2022-12-13 NOTE — Progress Notes (Signed)
  Echocardiogram 2D Echocardiogram has been performed.  Josecarlos Harriott Wynn Banker 12/13/2022, 1:39 PM

## 2022-12-15 ENCOUNTER — Encounter: Payer: Self-pay | Admitting: Hematology and Oncology

## 2022-12-15 ENCOUNTER — Other Ambulatory Visit: Payer: Self-pay | Admitting: *Deleted

## 2022-12-15 ENCOUNTER — Other Ambulatory Visit (HOSPITAL_COMMUNITY): Payer: Self-pay

## 2022-12-15 ENCOUNTER — Other Ambulatory Visit: Payer: Self-pay

## 2022-12-15 MED ORDER — PREDNISONE 50 MG PO TABS
ORAL_TABLET | ORAL | 0 refills | Status: DC
  Filled 2022-12-15: qty 3, 1d supply, fill #0

## 2022-12-15 NOTE — Progress Notes (Signed)
Due to pt hx of allergic reaction to IV contrast verbal orders received from MD for pt to receive 13 hour prep with 50 mg p.o prednisone and 50 mg p.o OTC benadryl. Pt educated to take 1 tablet of p.o prednisone 13 hours prior to scan, 2nd tablet of prednisone 7 hours prior to scan and 3rd tablet of prednisone 1 hour prior to scan.  Pt also educated to take 50 mg p.o OTC benadryl 1 hours prior to the scan.  Prednisone prescription sent to pharmacy on file, pt educated and verbalized understanding.

## 2022-12-16 ENCOUNTER — Other Ambulatory Visit (HOSPITAL_COMMUNITY): Payer: Self-pay

## 2022-12-16 ENCOUNTER — Other Ambulatory Visit: Payer: Self-pay

## 2022-12-17 ENCOUNTER — Ambulatory Visit (HOSPITAL_BASED_OUTPATIENT_CLINIC_OR_DEPARTMENT_OTHER): Payer: 59

## 2022-12-19 ENCOUNTER — Other Ambulatory Visit: Payer: Self-pay | Admitting: *Deleted

## 2022-12-19 DIAGNOSIS — Z171 Estrogen receptor negative status [ER-]: Secondary | ICD-10-CM

## 2022-12-19 NOTE — Progress Notes (Unsigned)
Received message from PA team stating CT CAP not approved for [facility] and pt needing to have scans completed at [facility].  Orders placed.  RN attempt x1 to contact pt.  No answer, LVM for pt to return call to the office.

## 2022-12-20 ENCOUNTER — Inpatient Hospital Stay: Payer: 59 | Attending: Medical

## 2022-12-20 ENCOUNTER — Inpatient Hospital Stay: Payer: 59 | Admitting: Adult Health

## 2022-12-20 ENCOUNTER — Encounter: Payer: Self-pay | Admitting: Adult Health

## 2022-12-20 ENCOUNTER — Other Ambulatory Visit: Payer: Self-pay

## 2022-12-20 VITALS — BP 127/72 | HR 74 | Resp 18

## 2022-12-20 VITALS — BP 116/83 | HR 72 | Temp 98.0°F | Resp 18 | Wt 120.3 lb

## 2022-12-20 DIAGNOSIS — C7931 Secondary malignant neoplasm of brain: Secondary | ICD-10-CM | POA: Diagnosis present

## 2022-12-20 DIAGNOSIS — Z171 Estrogen receptor negative status [ER-]: Secondary | ICD-10-CM | POA: Diagnosis not present

## 2022-12-20 DIAGNOSIS — C50212 Malignant neoplasm of upper-inner quadrant of left female breast: Secondary | ICD-10-CM | POA: Insufficient documentation

## 2022-12-20 DIAGNOSIS — Z803 Family history of malignant neoplasm of breast: Secondary | ICD-10-CM | POA: Diagnosis not present

## 2022-12-20 DIAGNOSIS — C50912 Malignant neoplasm of unspecified site of left female breast: Secondary | ICD-10-CM

## 2022-12-20 DIAGNOSIS — Z5112 Encounter for antineoplastic immunotherapy: Secondary | ICD-10-CM | POA: Diagnosis present

## 2022-12-20 DIAGNOSIS — Z808 Family history of malignant neoplasm of other organs or systems: Secondary | ICD-10-CM | POA: Insufficient documentation

## 2022-12-20 DIAGNOSIS — Z170 Estrogen receptor positive status [ER+]: Secondary | ICD-10-CM | POA: Insufficient documentation

## 2022-12-20 DIAGNOSIS — Z923 Personal history of irradiation: Secondary | ICD-10-CM | POA: Diagnosis not present

## 2022-12-20 DIAGNOSIS — F17210 Nicotine dependence, cigarettes, uncomplicated: Secondary | ICD-10-CM | POA: Diagnosis not present

## 2022-12-20 MED ORDER — DIPHENHYDRAMINE HCL 25 MG PO CAPS
25.0000 mg | ORAL_CAPSULE | Freq: Once | ORAL | Status: AC
  Administered 2022-12-20: 25 mg via ORAL
  Filled 2022-12-20: qty 1

## 2022-12-20 MED ORDER — TRASTUZUMAB-ANNS CHEMO 150 MG IV SOLR
6.0000 mg/kg | Freq: Once | INTRAVENOUS | Status: AC
  Administered 2022-12-20: 300 mg via INTRAVENOUS
  Filled 2022-12-20: qty 14.29

## 2022-12-20 MED ORDER — SODIUM CHLORIDE 0.9 % IV SOLN: Freq: Once | INTRAVENOUS | Status: AC

## 2022-12-20 MED ORDER — ACETAMINOPHEN 325 MG PO TABS
650.0000 mg | ORAL_TABLET | Freq: Once | ORAL | Status: AC
  Administered 2022-12-20: 650 mg via ORAL
  Filled 2022-12-20: qty 2

## 2022-12-20 MED ORDER — SODIUM CHLORIDE 0.9% FLUSH
10.0000 mL | INTRAVENOUS | Status: DC | PRN
  Administered 2022-12-20: 10 mL

## 2022-12-20 MED ORDER — HEPARIN SOD (PORK) LOCK FLUSH 100 UNIT/ML IV SOLN
500.0000 [IU] | Freq: Once | INTRAVENOUS | Status: AC | PRN
  Administered 2022-12-20: 500 [IU]

## 2022-12-20 NOTE — Patient Instructions (Signed)
[facility]  Discharge Instructions: Thank you for choosing [facility] to provide your oncology and hematology care.   If you have a lab appointment with the Cancer Center, please go directly to the Cancer Center and check in at the registration area.   Wear comfortable clothing and clothing appropriate for easy access to any Portacath or PICC line.   We strive to give you quality time with your provider. You may need to reschedule your appointment if you arrive late (15 or more minutes).  Arriving late affects you and other patients whose appointments are after yours.  Also, if you miss three or more appointments without notifying the office, you may be dismissed from the clinic at the provider's discretion.      For prescription refill requests, have your pharmacy contact our office and allow 72 hours for refills to be completed.    Today you received the following chemotherapy and/or immunotherapy agents: Trastuzumab      To help prevent nausea and vomiting after your treatment, we encourage you to take your nausea medication as directed.  BELOW ARE SYMPTOMS THAT SHOULD BE REPORTED IMMEDIATELY: *FEVER GREATER THAN 100.4 F (38 C) OR HIGHER *CHILLS OR SWEATING *NAUSEA AND VOMITING THAT IS NOT CONTROLLED WITH YOUR NAUSEA MEDICATION *UNUSUAL SHORTNESS OF BREATH *UNUSUAL BRUISING OR BLEEDING *URINARY PROBLEMS (pain or burning when urinating, or frequent urination) *BOWEL PROBLEMS (unusual diarrhea, constipation, pain near the anus) TENDERNESS IN MOUTH AND THROAT WITH OR WITHOUT PRESENCE OF ULCERS (sore throat, sores in mouth, or a toothache) UNUSUAL RASH, SWELLING OR PAIN  UNUSUAL VAGINAL DISCHARGE OR ITCHING   Items with * indicate a potential emergency and should be followed up as soon as possible or go to the Emergency Department if any problems should occur.  Please show the CHEMOTHERAPY ALERT CARD or IMMUNOTHERAPY ALERT CARD at  check-in to the Emergency Department and triage nurse.  Should you have questions after your visit or need to cancel or reschedule your appointment, please contact [facility]  Dept: [phone]  and follow the prompts.  Office hours are 8:00 a.m. to 4:30 p.m. Monday - Friday. Please note that voicemails left after 4:00 p.m. may not be returned until the following business day.  We are closed weekends and major holidays. You have access to a nurse at all times for urgent questions. Please call the main number to the clinic Dept: [phone] and follow the prompts.   For any non-urgent questions, you may also contact your provider using MyChart. We now offer e-Visits for anyone 18 and older to request care online for non-urgent symptoms. For details visit [email or web address].   Also download the MyChart app! Go to the app store, search "MyChart", open the app, select [facility], and log in with your MyChart username and password.  

## 2022-12-20 NOTE — Progress Notes (Unsigned)
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 ---s/p Lumpectomy/ /Initial Ca 1997, Brain Mets 2019 Staging form: Breast, AJCC 7th Edition - Clinical: Stage IIA (T2, N0, cM0) - Unsigned Histopathologic type: 9931 Laterality: Left Tumor size (mm): 13 Staging comments: Staged at breast conference 12.4.13  - Pathologic: Stage IIA (T1c, N1, cM0) - Unsigned Histopathologic type: 9931 Laterality: Left Tumor size (mm): 13   SUMMARY OF ONCOLOGIC HISTORY: Oncology History  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 Ki-67 was 70% HER-2 was amplified with a ratio 2.91   07/12/2012 - 07/17/2013 Neo-Adjuvant Chemotherapy   TCH 6 followed by Herceptin maintenance   12/11/2012 Surgery   Left breast lumpectomy: 1.8 cm tumor 1 positive sentinel node, axillary lymph node dissection 02/08/2013 showed 0/13 lymph nodes   03/25/2013 - 05/06/2013 Radiation Therapy   Adjuvant radiation therapy   06/05/2013 - 07/20/2017 Anti-estrogen oral therapy   Tamoxifen 20 mg daily   07/27/2017 Relapse/Recurrence   MRI Brain: 3.4 x 2.9 x 2.9 cm RIGHT frontal lobe mass with imaging characteristics of solitary metastasis. Extensive vasogenic edema resulting in 9 mm RIGHT to LEFT midline shift. Equivocal very early LEFT ventricle entrapment.    08/04/2017 Surgery   Rt frontal brain resection: Poorly differentiated tumor IHC suggests breast primary ER and PR Positive   08/25/2017 - 09/04/2017 Radiation Therapy   Stereotactic radiation   09/18/2017 -  Anti-estrogen oral therapy   Lapatinib with letrozole   12/18/2017 -  12/19/2017 Radiation Therapy   New right parietal lobe metastases status post [facility]   08/27/2018 - 08/27/2020 Radiation Therapy   SRS to new brain metastases   05/09/2019 Relapse/Recurrence   Interval increase in size of the enhancing nodular left internal mammary soft tissue 2.8 cm.  Redemonstrated enlarged supraclavicular, lower cervical and lower posterior cervical nodes unchanged.  Interval increase in the bony erosion of the posterior and lateral left third rib, increasing soft tissue lesion eroding the left sternal body 3.1 cm was 2.5 cm.  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/Initial Ca 1997, Brain Mets 2019 06/10/2019   Mediastinal adenopathy    H/O coccidioidomycosis and Aspergillosus 05/16/2018   DOE (dyspnea on exertion) 05/03/2018   Diarrhea 04/22/2018   Cavitary lesion of lung in area of previous cyts in apex of Sup Segment of LLL 03/29/2018   Aortic atherosclerosis (HCC) 01/22/2018   Emphysema lung (HCC) 01/22/2018   CAP (community acquired pneumonia) 11/23/2017   Metastasis to brain (HCC) 08/04/2017   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 (HCC) 07/27/2017   Nicotine abuse 07/27/2017   Migraines 07/27/2017   HCAP (healthcare-associated pneumonia) 11/26/2016   Upper airway cough syndrome 08/10/2016   Abnormal echocardiogram 08/07/2013   Chest tightness or pressure 08/07/2013   Hx of radiation therapy    Edema of left lower extremity 11/19/2012   Tachycardia 09/20/2012   Obstructive bronchiectasis (HCC) 01/26/2011   Cigarette smoker 01/26/2011    is allergic to aspirin, protonix [pantoprazole], doxycycline, promethazine-codeine, sulfamethoxazole-trimethoprim, and iodinated contrast media.  MEDICAL HISTORY: 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   Asthma    Breast cancer (HCC)    Bronchiectasis (HCC)    Bronchiolitis    Cancer (HCC)    breast cancer 2014   Cancer, metastatic to liver (HCC)    2021   Complication of anesthesia    bp dropped + desat    COPD (chronic obstructive pulmonary disease) (HCC)    Dyspnea    DOE   GERD (gastroesophageal reflux disease)    H/O coccidioidomycosis    was reason for lung lobectomy   Headache(784.0)    due to eye strain or not eating   History of anemia    no current problem   History of asthma     as a child   History of breast cancer 2014   left   History of chemotherapy    finished 07/17/2013   History of hiatal hernia    AGE 33   Hx of radiation therapy 03/25/13-05/06/13   left breast 5000 cGy/25 sessions, left breast boost 1000 cGy/5 sessions   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   Pneumonia    LAST FLARE UP 01/2018   Rash 02/22/2021   Runny nose 07/30/2013   clear drainage   Smoker 05/24/2021   Wears dentures    upper    SURGICAL HISTORY: Past Surgical History:  Procedure Laterality Date   APPLICATION OF CRANIAL NAVIGATION N/A 08/04/2017   Procedure: APPLICATION OF CRANIAL NAVIGATION;  Surgeon: [name], Loura Halt, MD;  Location: [facility] OR;  Service: Neurosurgery;  Laterality: N/A;   AXILLARY LYMPH NODE DISSECTION Left 02/08/2013   Procedure: LEFT AXILLARY DISSECTION;  Surgeon: Mariella Saa, MD;  Location: [facility];  Service: General;  Laterality: Left;   BREAST CYST EXCISION Right 2006   BREAST LUMPECTOMY Left 2014   BREAST LUMPECTOMY  WITH NEEDLE LOCALIZATION AND AXILLARY SENTINEL LYMPH NODE BX Left 12/31/2012   Procedure: NEEDLE LOCALIZATION LEFT BREAST LUMPECTOMY AND LEFT AXILLARY SENTENIAL LYMPH NODE BX;  Surgeon: Mariella Saa, MD;  Location: [facility];  Service: General;  Laterality: Left;   BRONCHIAL BRUSHINGS  03/17/2020   Procedure: BRONCHIAL BRUSHINGS;  Surgeon: Josephine Igo, DO;  Location: [facility];  Service: Pulmonary;;   BRONCHIAL WASHINGS  03/17/2020   Procedure: BRONCHIAL WASHINGS;  Surgeon: Josephine Igo, DO;  Location: [facility];  Service: Pulmonary;;   CESAREAN SECTION  1995/1996   CRANIOTOMY Right 08/04/2017   Procedure: Right Frontal craniotomy for resection of tumor with stereotactic navigation;  Surgeon: [name], Loura Halt, MD;  Location: [facility];  Service: Neurosurgery;  Laterality: Right;  Right Frontal craniotomy for resection of tumor with stereotactic navigation   IR CV LINE INJECTION  02/19/2021   IR CV LINE  INJECTION  11/12/2021   IR IMAGING GUIDED PORT INSERTION  05/31/2019   LUNG LOBECTOMY Left 05/1996   upper lobe - due to [facility] Fever   PORT-A-CATH REMOVAL Right 08/02/2013   Procedure: REMOVAL PORT-A-CATH;  Surgeon: Mariella Saa, MD;  Location: [facility];  Service: General;  Laterality: Right;   PORTACATH PLACEMENT  07/02/2012   Procedure: INSERTION PORT-A-CATH;  Surgeon: Mariella Saa, MD;  Location: [facility];  Service: General;  Laterality: N/A;  right   VIDEO BRONCHOSCOPY N/A 03/17/2020   Procedure: VIDEO BRONCHOSCOPY;  Surgeon: Josephine Igo, DO;  Location: [facility];  Service: Pulmonary;  Laterality: N/A;   VIDEO BRONCHOSCOPY WITH ENDOBRONCHIAL ULTRASOUND N/A 05/23/2018   Procedure: VIDEO BRONCHOSCOPY WITH ENDOBRONCHIAL ULTRASOUND;  Surgeon: Josephine Igo, DO;  Location: [facility];  Service: Thoracic;  Laterality: N/A;    SOCIAL HISTORY: Social History   Socioeconomic History   Marital status: Married    Spouse name: Not on file   Number of children: 2   Years of education: Not on file   Highest education level: Not on file  Occupational History   Occupation: Investment banker, corporate  Tobacco Use   Smoking status: Every Day    Packs/day: 0.30    Years: 38.00    Additional pack years: 0.00    Total pack years: 11.40    Types: Cigarettes   Smokeless tobacco: Never   Tobacco comments:    10 cigarettes smoked daily 10/19/20 ARJ   Vaping Use   Vaping Use: Never used  Substance and Sexual Activity   Alcohol use: No   Drug use: No   Sexual activity: Not Currently  Other Topics Concern   Not on file  Social History Narrative   Not on file   Social Determinants of Health   Financial Resource Strain: Not on file  Food Insecurity: No Food Insecurity (10/18/2022)   Hunger Vital Sign    Worried About Running Out of Food in the Last Year: Never true    Ran Out of Food in the Last Year: Never true  Transportation Needs: No Transportation  Needs (10/18/2022)   PRAPARE - Administrator, Civil Service (Medical): No    Lack of Transportation (Non-Medical): No  Physical Activity: Not on file  Stress: Not on file  Social Connections: Not on file  Intimate Partner Violence: Not At Risk (10/18/2022)   Humiliation, Afraid, Rape, and Kick questionnaire    Fear of Current or Ex-Partner: No    Emotionally Abused: No    Physically Abused: No  Sexually Abused: No    FAMILY HISTORY: Family History  Problem Relation Age of Onset   Emphysema Mother        was a smoker   Heart disease Mother    Melanoma Mother        dx in her 20s   Breast cancer Mother 49   Asthma Brother    Breast cancer Cousin        mother's maternal cousin; dx in her 14s    Review of Systems - Oncology    PHYSICAL EXAMINATION   Onc Performance Status - 12/20/22 0800       ECOG Perf Status   ECOG Perf Status Restricted in physically strenuous activity but ambulatory and able to carry out work of a light or sedentary nature, e.g., light house work, office work      KPS SCALE   KPS % SCORE Able to carry on normal activity, minor s/s of disease             Vitals:   12/20/22 0833  BP: 116/83  Pulse: 72  Resp: 18  Temp: 98 F (36.7 C)  SpO2: 97%    Physical Exam  LABORATORY DATA:  CBC    Component Value Date/Time   WBC 12.1 (H) 11/29/2022 0939   WBC 8.7 07/26/2021 0411   RBC 3.43 (L) 11/29/2022 0939   HGB 10.5 (L) 11/29/2022 0939   HGB 13.1 08/12/2014 0955   HCT 32.2 (L) 11/29/2022 0939   HCT 40.4 08/12/2014 0955   PLT 247 11/29/2022 0939   PLT 228 08/12/2014 0955   MCV 93.9 11/29/2022 0939   MCV 98.2 08/12/2014 0955   MCH 30.6 11/29/2022 0939   MCHC 32.6 11/29/2022 0939   RDW 16.6 (H) 11/29/2022 0939   RDW 13.0 08/12/2014 0955   LYMPHSABS 2.6 11/29/2022 0939   LYMPHSABS 2.0 08/12/2014 0955   MONOABS 1.0 11/29/2022 0939   MONOABS 0.5 08/12/2014 0955   EOSABS 0.3 11/29/2022 0939   EOSABS 0.2 08/12/2014 0955    BASOSABS 0.1 11/29/2022 0939   BASOSABS 0.0 08/12/2014 0955    CMP     Component Value Date/Time   NA 140 11/29/2022 0939   NA 142 08/12/2014 0956   K 3.6 11/29/2022 0939   K 4.6 08/12/2014 0956   CL 106 11/29/2022 0939   CL 100 12/20/2012 1509   CO2 28 11/29/2022 0939   CO2 26 08/12/2014 0956   GLUCOSE 92 11/29/2022 0939   GLUCOSE 94 08/12/2014 0956   GLUCOSE 98 12/20/2012 1509   BUN 19 11/29/2022 0939   BUN 10.1 08/12/2014 0956   CREATININE 0.76 11/29/2022 0939   CREATININE 0.88 03/11/2019 1057   CREATININE 0.9 08/12/2014 0956   CALCIUM 9.1 11/29/2022 0939   CALCIUM 9.3 08/12/2014 0956   PROT 6.5 11/29/2022 0939   PROT 7.1 08/12/2014 0956   ALBUMIN 3.6 11/29/2022 0939   ALBUMIN 3.8 08/12/2014 0956   AST 13 (L) 11/29/2022 0939   AST 15 08/12/2014 0956   ALT 14 11/29/2022 0939   ALT 11 08/12/2014 0956   ALKPHOS 62 11/29/2022 0939   ALKPHOS 68 08/12/2014 0956   BILITOT 0.2 (L) 11/29/2022 0939   BILITOT 0.25 08/12/2014 0956   GFRNONAA >60 11/29/2022 0939   GFRNONAA 76 03/11/2019 1057   GFRAA >60 03/24/2020 1012   GFRAA >60 02/11/2020 0836   GFRAA 88 03/11/2019 1057       PENDING LABS:   RADIOGRAPHIC STUDIES:  No results found.   PATHOLOGY:  ASSESSMENT and THERAPY PLAN:   No problem-specific Assessment & Plan notes found for this encounter.   No orders of the defined types were placed in this encounter.   All questions were answered. The patient knows to call the clinic with any problems, questions or concerns. We can certainly see the patient much sooner if necessary. This note was electronically signed. Noreene Filbert, NP 12/20/2022

## 2022-12-21 ENCOUNTER — Encounter: Payer: Self-pay | Admitting: Hematology and Oncology

## 2022-12-21 ENCOUNTER — Ambulatory Visit (HOSPITAL_COMMUNITY): Payer: 59

## 2022-12-21 ENCOUNTER — Other Ambulatory Visit: Payer: Self-pay

## 2022-12-21 ENCOUNTER — Inpatient Hospital Stay: Admission: RE | Admit: 2022-12-21 | Payer: 59 | Source: Ambulatory Visit

## 2022-12-21 NOTE — Assessment & Plan Note (Signed)
Melissa [name] is a 57 year-old woman with HER2+ metastatic breast cancer here today for f/u prior to receiving herceptin.    Metastatic breast cancer: no clinical signs of progression.  She is due for restaging scans and we are working on getting those scheduled.  She will proceed with Herceptin today. At risk for heart failure: most recent echo 12/2022 was normal.  Repeat in 3-6 months. Weight loss: improved with dexamethasone bid.   Brain metastases: has undergone multiple SRS treatments, most recently at [facility].  She will continue to f/u at [facility].    RTC in 3 weeks for f/u with Dr. Pamelia Hoit and her next treatment.

## 2022-12-22 ENCOUNTER — Telehealth: Payer: Self-pay | Admitting: *Deleted

## 2022-12-22 NOTE — Telephone Encounter (Signed)
CC staff secured appt for patient for CT on 12/21/22 at [facility].  Patient stated during appt on 12/20/22 she did not think she could have CT at [facility] on 12/21/22 due to location's distance from her home. Patient was given appt number for [facility] and a list of locations on 12/20/22 with directions to contact [facility] for appt. At that time, the appt on 12/21/22 was not cancelled.  On 12/21/22 AM,this writer contacted [facility] and left message with appt supervisor at [phone] to explain that patient would not be coming that day. This Clinical research associate requested they cxl patient's appt, not mark her as a "no show" and not charge her with "no show" fee even though call was not 24 hours prior to appt.  Noted in chart on 12/22/22 that patient's appt at [facility] on 12/21/22 was marked "no show". Contacted [facility] appt desk and spoke with Tansie with same message as previously left for supervisor. Tansie stated she would make a note of request.  Contacted patient and advised pt that this writer had contacted [facility] AM 12/21/22 and requested appt on 12/21/22 cxl'd and not marked as "no show".  Encouraged her to make appt for CT soon so that results will be available before she returns for next appt with Dr. Pamelia Hoit at [facility] on 7/22. Ms. Meline verbalized understanding of all information.

## 2023-01-01 ENCOUNTER — Other Ambulatory Visit: Payer: Self-pay

## 2023-01-02 ENCOUNTER — Encounter: Payer: Self-pay | Admitting: Hematology and Oncology

## 2023-01-02 ENCOUNTER — Telehealth: Payer: Self-pay

## 2023-01-02 ENCOUNTER — Inpatient Hospital Stay
Admission: RE | Admit: 2023-01-02 | Discharge: 2023-01-02 | Disposition: A | Discharge status: Home / Self Care | Payer: Self-pay | Source: Ambulatory Visit | Attending: Internal Medicine | Admitting: Internal Medicine

## 2023-01-02 ENCOUNTER — Other Ambulatory Visit: Payer: Self-pay | Admitting: Radiation Therapy

## 2023-01-02 DIAGNOSIS — C7931 Secondary malignant neoplasm of brain: Secondary | ICD-10-CM

## 2023-01-02 NOTE — Telephone Encounter (Signed)
Pt had CT scan scheduled at our [place] office but requested it be changed to our [address]. This patients ordering MD had prescribed the patient a 13 hr prep for her original appointment date and time. I called the patient and discussed her new times to take prednisone and benadryl based off of her new appt time. The patient verbalized understanding.  01/25/23 10:00 PM- 50mg  Prednisone 01/26/23 4:00 AM- 50mg  Prednisone 01/26/23 10:00 AM - 50mg  Prednisone and 50mg  Benadryl

## 2023-01-05 ENCOUNTER — Other Ambulatory Visit: Payer: Self-pay | Admitting: Radiation Therapy

## 2023-01-05 DIAGNOSIS — C7931 Secondary malignant neoplasm of brain: Secondary | ICD-10-CM

## 2023-01-06 ENCOUNTER — Telehealth: Payer: Self-pay | Admitting: Radiation Therapy

## 2023-01-06 NOTE — Telephone Encounter (Signed)
I spoke with Melissa [name] about her upcoming radiation treatment planning appointments. She is doing well post op and was thankful for the new information. Melissa [name] has my contact information in case she has additional questions before we see her on 7/22.   Melissa [name] R.T.(R)(T) Radiation Special Procedures Navigator

## 2023-01-07 NOTE — Progress Notes (Signed)
Patient Care Team: Thana Ates, MD as PCP - General (Internal Medicine) Domenick Gong, MD as Referring Physician (Emergency Medicine)  DIAGNOSIS:  Encounter Diagnoses  Name Primary?   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 Ki-67 was 70% HER-2 was amplified with a ratio 2.91   07/12/2012 - 07/17/2013 Neo-Adjuvant Chemotherapy   TCH 6 followed by Herceptin maintenance   12/11/2012 Surgery   Left breast lumpectomy: 1.8 cm tumor 1 positive sentinel node, axillary lymph node dissection 02/08/2013 showed 0/13 lymph nodes   03/25/2013 - 05/06/2013 Radiation Therapy   Adjuvant radiation therapy   06/05/2013 - 07/20/2017 Anti-estrogen oral therapy   Tamoxifen 20 mg daily   07/27/2017 Relapse/Recurrence   MRI Brain: 3.4 x 2.9 x 2.9 cm RIGHT frontal lobe mass with imaging characteristics of solitary metastasis. Extensive vasogenic edema resulting in 9 mm RIGHT to LEFT midline shift. Equivocal very early LEFT ventricle entrapment.    08/04/2017 Surgery   Rt frontal brain resection: Poorly differentiated tumor IHC suggests breast primary ER and PR Positive   08/25/2017 - 09/04/2017 Radiation Therapy   Stereotactic radiation   09/18/2017 -  Anti-estrogen oral therapy   Lapatinib with letrozole   12/18/2017 - 12/19/2017 Radiation Therapy   New right parietal lobe metastases status post [facility]   08/27/2018 - 08/27/2020 Radiation Therapy   SRS to new brain metastases   05/09/2019 Relapse/Recurrence   Interval increase in size of the enhancing nodular left  internal mammary soft tissue 2.8 cm.  Redemonstrated enlarged supraclavicular, lower cervical and lower posterior cervical nodes unchanged.  Interval increase in the bony erosion of the posterior and lateral left third rib, increasing soft tissue lesion eroding the left sternal body 3.1 cm was 2.5 cm.  Bronchiectatic changes   06/19/2019 - 05/05/2020 Chemotherapy   ado-trastuzumab emtansine (KADCYLA)     06/15/2020 -  Chemotherapy   Xeloda, Tucatinib, Herceptin    04/12/2022 - 09/06/2022 Chemotherapy   Patient is on Treatment Plan : BREAST Trastuzumab IV (8/6) or SQ (600) D1 q21d     09/27/2022 -  Chemotherapy   Patient is on Treatment Plan : BREAST MAINTENANCE Trastuzumab IV (6) or SQ (600) D1 q21d x 13 cycles     11/08/2022 Miscellaneous   Adding low-dose neratinib (80 mg) to Herceptin   12/20/2022 Cancer Staging   Staging form: Breast, AJCC 7th Edition - Pathologic: Stage IV (M1) - Signed by Loa Socks, NP on 12/20/2022   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 with the above-mentioned history of metastatic breast cancer currently on treatment  with Xeloda and Herceptin. She presents to the clinic today for follow-up. Pt reports that she has an respiratory infection and is not feeling well. She denies any pain, but has had a lot of headaches. Denies any fevers. She states that she is doing more sleeping.   ALLERGIES:  is allergic to aspirin, protonix [pantoprazole], doxycycline, promethazine-codeine,  sulfamethoxazole-trimethoprim, and iodinated contrast media.  MEDICATIONS:  Current Outpatient Medications  Medication Sig Dispense Refill   azithromycin (ZITHROMAX Z-PAK) 250 MG tablet As directed on box 6 each 0   levalbuterol (XOPENEX) 0.63 MG/3ML nebulizer solution Take 3 mLs (0.63 mg total) by nebulization every 4 (four) hours as needed for wheezing or shortness of breath. 90 mL 12   levETIRAcetam (KEPPRA) 1000 MG tablet Take by mouth.     ondansetron (ZOFRAN-ODT) 4 MG disintegrating tablet Take 4 mg by mouth every 8 (eight) hours as needed.     acetaminophen (TYLENOL) 325 MG tablet Take 2 tablets (650 mg total) by mouth every 6 (six) hours as needed for mild pain (or Fever >/= 101). 12 tablet 0   albuterol (VENTOLIN HFA) 108 (90 Base) MCG/ACT inhaler Inhale 2 puffs into the lungs every 6 (six) hours as needed for wheezing or shortness of breath (cough). 18 g 5   calcium carbonate (TUMS EX) 750 MG chewable tablet Chew by mouth.     Cholecalciferol 250 MCG (10000 UT) CAPS Take by mouth.     Fluticasone-Salmeterol,sensor, (AIRDUO DIGIHALER) 113-14 MCG/ACT AEPB Inhale 1 puff into the lungs 2 (two) times daily. 1 each 4   GARLIC PO Take by mouth.     lidocaine-prilocaine (EMLA) cream APPLY EXTERNALLY TO THE AFFECTED AREA 1 TIME 30 g 3   Multiple Vitamins-Minerals (EMERGEN-C VITAMIN C PO) Take by mouth.     OIL OF OREGANO PO Take 500 mg by mouth 2 (two) times daily.     oxyCODONE (OXY IR/ROXICODONE) 5 MG immediate release tablet Take by mouth. (Patient not taking: Reported on 12/20/2022)     predniSONE (DELTASONE) 50 MG tablet Take one tablet 13 hrs prior to scan, 1 tablet 7 hrs prior to scan, 1 tablet 1 hr prior to scan. 3 tablet 0   SYMBICORT 160-4.5 MCG/ACT inhaler Inhale 2 puffs into the lungs 2 (two) times daily.     Vitamin E (VITAMIN E/D-ALPHA NATURAL) 268 MG (400 UNIT) CAPS Take by mouth.     voriconazole (VFEND) 200 MG tablet Take 1 tablet (200 mg total) by mouth 2 (two) times daily. 60  tablet 11   No current facility-administered medications for this visit.   Facility-Administered Medications Ordered in Other Visits  Medication Dose Route Frequency Provider Last Rate Last Admin   0.9 %  sodium chloride infusion   Intravenous Once Serena Croissant, MD       acetaminophen (TYLENOL) tablet 650 mg  650 mg Oral Once Serena Croissant, MD       alteplase (CATHFLO ACTIVASE) injection 2 mg  2 mg Intracatheter Once PRN Serena Croissant, MD       diphenhydrAMINE (BENADRYL) capsule 25 mg  25 mg Oral Once Serena Croissant, MD       heparin lock flush 100 unit/mL  500 Units Intracatheter Once PRN Serena Croissant, MD       sodium chloride flush (NS) 0.9 % injection 10 mL  10 mL Intracatheter PRN Serena Croissant, MD   10 mL at 07/06/21 1428   sodium chloride flush (NS) 0.9 % injection 10 mL  10 mL Intracatheter PRN  Serena Croissant, MD       trastuzumab-[facility]) 300 mg in sodium chloride 0.9 % 250 mL chemo infusion  6 mg/kg (Treatment Plan Recorded) Intravenous Once Serena Croissant, MD        PHYSICAL EXAMINATION: ECOG PERFORMANCE STATUS: 1 - Symptomatic but completely ambulatory  Vitals:   01/10/23 0810  BP: 101/73  Pulse: (!) 101  Resp: 18  Temp: 97.7 F (36.5 C)  SpO2: 97%   Filed Weights   01/10/23 0810  Weight: 116 lb 3.2 oz (52.7 kg)      LABORATORY DATA:  I have reviewed the data as listed    Latest Ref Rng & Units 11/29/2022    9:39 AM 05/24/2022   10:31 AM 04/12/2022    8:20 AM  CMP  Glucose 70 - 99 mg/dL 92  56  96   BUN 6 - 20 mg/dL 19  11  13    Creatinine 0.44 - 1.00 mg/dL 5.36  6.44  0.34   Sodium 135 - 145 mmol/L 140  139  138   Potassium 3.5 - 5.1 mmol/L 3.6  4.5  4.1   Chloride 98 - 111 mmol/L 106  104  106   CO2 22 - 32 mmol/L 28  31  29    Calcium 8.9 - 10.3 mg/dL 9.1  74.2  9.4   Total Protein 6.5 - 8.1 g/dL 6.5  8.2  7.2   Total Bilirubin 0.3 - 1.2 mg/dL 0.2  0.2  0.2   Alkaline Phos 38 - 126 U/L 62  74  71   AST 15 - 41 U/L 13  14  16    ALT 0 - 44 U/L 14   8  13      Lab Results  Component Value Date   WBC 12.1 (H) 11/29/2022   HGB 10.5 (L) 11/29/2022   HCT 32.2 (L) 11/29/2022   MCV 93.9 11/29/2022   PLT 247 11/29/2022   NEUTROABS 8.0 (H) 11/29/2022    ASSESSMENT & PLAN:  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  SRS brain: 08/25/2017-09/04/2017 2. Anti Her 2 therapy with Lapatinib started 09/17/2017-05/14/2019: Stopped for progression 3.  I discontinued tamoxifen and started her on letrozole 2.5 mg daily.  05/14/2019 stopped for progression 4.  Stereotactic radiosurgery 12/19/2017 to the new right parietal lobe metastases. 5.  Kadcyla: Received 16 cycles discontinued 05/05/2020 6.  Tucatinib Xeloda: Discontinued because of hand-foot syndrome 06/11/2020-12/15/2020 7. Herceptin/Neratinib: unable to tolerate due to diarrhea and weight loss 8.  SRS to new brain mets 10/01/2021 9.  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 Continue with Herceptin maintenance Upper respiratory infection: Without any fevers because of the Aspergillus of the lungs and her current symptoms, I will send prescription for azithromycin.  No orders of the defined types were placed in this encounter.  The patient has a good understanding of the overall plan. she agrees with it. she will call with any problems that may develop before the next visit here. Total time spent: 30 mins including face to face time and time spent for planning, charting and co-ordination of care   Tamsen Meek, MD 01/10/23    I Janan Ridge am acting as a Neurosurgeon for [employer]  I have reviewed the above documentation for accuracy and completeness, and I agree with the above.

## 2023-01-10 ENCOUNTER — Inpatient Hospital Stay: Payer: 59 | Admitting: Hematology and Oncology

## 2023-01-10 ENCOUNTER — Inpatient Hospital Stay: Payer: 59

## 2023-01-10 ENCOUNTER — Ambulatory Visit
Admission: RE | Admit: 2023-01-10 | Discharge: 2023-01-10 | Disposition: A | Discharge status: Home / Self Care | Payer: 59 | Source: Ambulatory Visit | Attending: Radiation Oncology | Admitting: Radiation Oncology

## 2023-01-10 ENCOUNTER — Other Ambulatory Visit: Payer: Self-pay

## 2023-01-10 VITALS — BP 101/73 | HR 101 | Temp 97.7°F | Resp 18 | Ht 62.0 in | Wt 116.2 lb

## 2023-01-10 VITALS — BP 102/62 | HR 83 | Resp 17

## 2023-01-10 DIAGNOSIS — C7951 Secondary malignant neoplasm of bone: Secondary | ICD-10-CM | POA: Insufficient documentation

## 2023-01-10 DIAGNOSIS — C7931 Secondary malignant neoplasm of brain: Secondary | ICD-10-CM | POA: Insufficient documentation

## 2023-01-10 DIAGNOSIS — C50912 Malignant neoplasm of unspecified site of left female breast: Secondary | ICD-10-CM | POA: Insufficient documentation

## 2023-01-10 DIAGNOSIS — Z5112 Encounter for antineoplastic immunotherapy: Secondary | ICD-10-CM | POA: Insufficient documentation

## 2023-01-10 DIAGNOSIS — Z171 Estrogen receptor negative status [ER-]: Secondary | ICD-10-CM

## 2023-01-10 DIAGNOSIS — Z170 Estrogen receptor positive status [ER+]: Secondary | ICD-10-CM | POA: Insufficient documentation

## 2023-01-10 DIAGNOSIS — C50212 Malignant neoplasm of upper-inner quadrant of left female breast: Secondary | ICD-10-CM | POA: Insufficient documentation

## 2023-01-10 DIAGNOSIS — C787 Secondary malignant neoplasm of liver and intrahepatic bile duct: Secondary | ICD-10-CM | POA: Insufficient documentation

## 2023-01-10 DIAGNOSIS — Z510 Encounter for antineoplastic radiation therapy: Secondary | ICD-10-CM | POA: Insufficient documentation

## 2023-01-10 MED ORDER — HEPARIN SOD (PORK) LOCK FLUSH 100 UNIT/ML IV SOLN
500.0000 [IU] | Freq: Once | INTRAVENOUS | Status: AC | PRN
  Administered 2023-01-10: 500 [IU]

## 2023-01-10 MED ORDER — SODIUM CHLORIDE 0.9 % IV SOLN: Freq: Once | INTRAVENOUS | Status: AC

## 2023-01-10 MED ORDER — SODIUM CHLORIDE 0.9% FLUSH
10.0000 mL | INTRAVENOUS | Status: DC | PRN
  Administered 2023-01-10: 10 mL

## 2023-01-10 MED ORDER — DIPHENHYDRAMINE HCL 25 MG PO CAPS
25.0000 mg | ORAL_CAPSULE | Freq: Once | ORAL | Status: AC
  Administered 2023-01-10: 25 mg via ORAL
  Filled 2023-01-10: qty 1

## 2023-01-10 MED ORDER — LEVALBUTEROL HCL 0.63 MG/3ML IN NEBU: 0.6300 mg | INHALATION_SOLUTION | RESPIRATORY_TRACT | 12 refills | Status: AC | PRN

## 2023-01-10 MED ORDER — ACETAMINOPHEN 325 MG PO TABS
650.0000 mg | ORAL_TABLET | Freq: Once | ORAL | Status: AC
  Administered 2023-01-10: 650 mg via ORAL
  Filled 2023-01-10: qty 2

## 2023-01-10 MED ORDER — AZITHROMYCIN 250 MG PO TABS: ORAL_TABLET | ORAL | 0 refills | Status: DC

## 2023-01-10 MED ORDER — TRASTUZUMAB-ANNS CHEMO 150 MG IV SOLR
6.0000 mg/kg | Freq: Once | INTRAVENOUS | Status: AC
  Administered 2023-01-10: 300 mg via INTRAVENOUS
  Filled 2023-01-10: qty 14.29

## 2023-01-10 NOTE — Progress Notes (Addendum)
Under the instruction of Jalene Mullet, I have successfully removed 21 staples from the RT temple area of Melissa [name] on 01/10/2023. Area clean, dry and intact. Topical neosporin applied.  Vitals- BP-107/65 Pulse- 83 Temp 97.1 Resp- 18 Oxygen- 98  This concludes the interaction.  Ruel Favors, LPN

## 2023-01-10 NOTE — Assessment & Plan Note (Signed)
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  SRS brain: 08/25/2017-09/04/2017 2. Anti Her 2 therapy with Lapatinib started 09/17/2017-05/14/2019: Stopped for progression 3.  I discontinued tamoxifen and started her on letrozole 2.5 mg daily.  05/14/2019 stopped for progression 4.  Stereotactic radiosurgery 12/19/2017 to the new right parietal lobe metastases. 5.  Kadcyla: Received 16 cycles discontinued 05/05/2020 6.  Tucatinib Xeloda: Discontinued because of hand-foot syndrome 06/11/2020-12/15/2020 7. Herceptin/Neratinib: unable to tolerate due to diarrhea and weight loss 8.  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 CT scans and brain MRI scheduled for 01/26/2023 Return to clinic in 3 weeks to discuss scan results

## 2023-01-16 ENCOUNTER — Institutional Professional Consult (permissible substitution): Payer: Self-pay | Admitting: Radiation Oncology

## 2023-01-18 NOTE — Progress Notes (Signed)
Histology and Location of Primary Cancer:  Malignant neoplasm of upper-inner quadrant of left breast in female, estrogen receptor negative (HCC)   01-02-13   Location(s) of Symptomatic tumor(s):  MR Brain W Wo Contrast 10/26/2022  IMPRESSION: 1. Status post left parietal craniotomy for resection of the medial left occipital metastasis. There is some nodular enhancement along the inferior and medial aspect of the cavity which raises concern for residual or recurrent tumor. Continued close attention on follow-up scans. 2. Slight increase in size of right paramedian pontine metastasis. 3. Slight increase in size of right temporal tip tumor with increased vasogenic edema and mass effect. Midline shift has increased from 2.5 mm to 5.2 mm. 4. Stable right frontal resection cavity and surrounding edema. 5. Stable 8 mm bilobed enhancing metastasis in the superior vermis. 6. No new enhancing foci.  MRI brain with and without contrast 12/30/2022  IMPRESSION:  1. Status post interval resection of a right temporal lobe lesion. Focal  nodular enhancement at the anterior margin may reflect residual tumor.  Attention on follow-up is recommended. Unchanged leftward midline shift of  1 cm.  2. Additional multifocal enhancing intracranial metastatic lesions and  previously resected lesions, not significantly changed.   Pt to have MRI on 02-01-23 prior to [facility] sim scheduled   Past/Anticipated chemotherapy by neurosurgery if any: Admission date: 12/29/2022 Discharge date: 12/30/2022   [facility] Attending: Lin Givens, MD   Procedure(s):  PR EXCIS SUPRATENT BRAIN TUMOR [phone] (CRANIECTOMY, TREPHINATION, BONE FLAP CRANIOTOMY; FOR EXCISION OF BRAIN TUMOR, SUPRATENTORIAL, EXCEPT MENINGIOMA) PR STEREOTACTIC COMP ASSIST PROC,CRANIAL,INTRADURAL [19147] (STEREOTACTIC COMPUTER-ASSISTED (NAVIGATIONAL) PROCEDURE; CRANIAL, INTRADURAL (LIST IN ADDITION TO PRIMARY PROCEDURE)) PR US GUIDE INTRAOPERATIVE [[id number]]  (ULTRASONIC GUIDANCE, INTRAOPERATIVE) PR MICROSURG TECHNIQUES,REQ OPER MICROSCOPE [[id number]] (MICROSURGICAL TECHNIQUES, REQUIRING USE OF OPERATING MICROSCOPE (LIST IN ADDITION TO PRIMARY PROCEDURE))  Principle Diagnosis: Metastasis to brain (CMS/HHS-HCC) [C79.31]   History of Present Illness:  Melissa [name] is a 57 y.o. with history of Breast Cancer initially diagnosed 05/2012. She was noted to have intracranial progression 07/2017. She has had multiple SRS treatments to lesions since then. Her local radiation oncologist, Dr. Barbaraann Cao, reached out to [name] to evaluate for possible LITT to a radiographically progressive left occipital metastases (treated 06/2021). She elected to proceed with craniotomy for resection of the left occipital lesion on 10/05/22 with Dr. Shaune Pollack- pathology revealing metastatic carcinoma consistent with breast origin.She was noted to have a R temporal lesion. She is being admitted for secondary craniotomy for right temporal tumor resection.   Past/Anticipated chemotherapy by medical oncology, if any:  Henreitta Leber, MD 11/22/2022   CNS Oncologic History 11/04/22: Post-op frx SRS L occipital [facility]) 10/06/22: Craniotomy, resection L occipital at [facility]), path is neoplasm 10/12/21: SRS to pons 16 Gy [facility]ry[facility] Running) 06/29/21: SRS to 2x L occipital 03/17/21: SRS x3 08/27/20: SRS x5 05/13/20: SRS x4  03/25/19: SRS right frontal 5 mm 09/04/18: SRS x2 right frontal 12/18/17: SRS R parietal 09/04/17: Post op SRS R frontal, 5 fxs 08/04/17: Craniotomy, resection R frontal met ([name])  Assessment/Plan Metastasis to brain [facility])   Melissa [name] is clinically stable today from focal neurologic standpiont.  MRI brain demonstrates progression of enhancing mass within right anterior temporal, suspicious for neoplasm given recent CNS histology.  This region was treated with [facility] September 2022.    We discussed and recommended craniotomy, resection for this site of progression. We will  reach out to Dr. Louie Casa team for an evaluation.  They are agreeable with this plan.  Decadron will resume 1mg  daily given chronic daily headache, analgesia overuse.  We also counseled on sleep hygiene.   Serena Croissant, MD 01/10/2023  SUMMARY OF ONCOLOGIC HISTORY:    Oncology History  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 ---s/p Lumpectomy/ /Initial Ca 1997, Brain Mets 2019  06/08/2012 Initial Diagnosis    invasive ductal carcinoma that was ER positive PR positive HER-2/neu positive measuring 3.1 cm by MRI criteria. Ki-67 was 70% HER-2 was amplified with a ratio 2.91    07/12/2012 - 07/17/2013 Neo-Adjuvant Chemotherapy    TCH 6 followed by Herceptin maintenance    12/11/2012 Surgery    Left breast lumpectomy: 1.8 cm tumor 1 positive sentinel node, axillary lymph node dissection 02/08/2013 showed 0/13 lymph nodes    03/25/2013 - 05/06/2013 Radiation Therapy    Adjuvant radiation therapy    06/05/2013 - 07/20/2017 Anti-estrogen oral therapy    Tamoxifen 20 mg daily    07/27/2017 Relapse/Recurrence    MRI Brain: 3.4 x 2.9 x 2.9 cm RIGHT frontal lobe mass with imaging characteristics of solitary metastasis. Extensive vasogenic edema resulting in 9 mm RIGHT to LEFT midline shift. Equivocal very early LEFT ventricle entrapment.      08/04/2017 Surgery    Rt frontal brain resection: Poorly differentiated tumor IHC suggests breast primary ER and PR Positive    08/25/2017 - 09/04/2017 Radiation Therapy    Stereotactic radiation    09/18/2017 -  Anti-estrogen oral therapy    Lapatinib with letrozole    12/18/2017 - 12/19/2017 Radiation Therapy    New right parietal lobe metastases status post [facility]    08/27/2018 - 08/27/2020 Radiation Therapy    SRS to new brain metastases    05/09/2019 Relapse/Recurrence    Interval increase in size of the enhancing nodular left internal mammary soft tissue 2.8 cm.  Redemonstrated enlarged supraclavicular, lower cervical and  lower posterior cervical nodes unchanged.  Interval increase in the bony erosion of the posterior and lateral left third rib, increasing soft tissue lesion eroding the left sternal body 3.1 cm was 2.5 cm.  Bronchiectatic changes    06/19/2019 - 05/05/2020 Chemotherapy    ado-trastuzumab emtansine (KADCYLA)       06/15/2020 -  Chemotherapy    Xeloda, Tucatinib, Herceptin      04/12/2022 - 09/06/2022 Chemotherapy    Patient is on Treatment Plan : BREAST Trastuzumab IV (8/6) or SQ (600) D1 q21d     09/27/2022 -  Chemotherapy    Patient is on Treatment Plan : BREAST MAINTENANCE Trastuzumab IV (6) or SQ (600) D1 q21d x 13 cycles     11/08/2022 Miscellaneous    Adding low-dose neratinib (80 mg) to Herceptin    12/20/2022 Cancer Staging    Staging form: Breast, AJCC 7th Edition - Pathologic: Stage IV (M1) - Signed by Loa Socks, NP on 12/20/2022    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/Initial Ca 1997, Brain Mets 2019  06/10/2019 Initial Diagnosis    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)    06/19/2019 - 05/05/2020 Chemotherapy    ado-trastuzumab emtansine (KADCYLA)       09/27/2022 -  Chemotherapy    Patient is on Treatment Plan : BREAST MAINTENANCE Trastuzumab IV (6) or SQ (600) D1 q21d x 13 cycles     ASSESSMENT & PLAN:  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 ---s/p Lumpectomy/ /Initial Ca 1997, Brain Mets 2019 Left breast  invasive ductal carcinoma ER/PR positive HER-2 positive initially 3.1 cm, Ki-67 70%, HER-2 amplified ratio 2.91 status post neoadjuvant chemotherapy followed by surgery which showed 1.8 cm tumor 1 positive sentinel lymph node T1cN1 M0 stage IB status post radiation therapy and Herceptin maintenance and took tamoxifen 06/05/2013-08/11/2017   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  SRS brain: 08/25/2017-09/04/2017 2. Anti Her 2 therapy with Lapatinib  started 09/17/2017-05/14/2019: Stopped for progression 3.  I discontinued tamoxifen and started her on letrozole 2.5 mg daily.  05/14/2019 stopped for progression 4.  Stereotactic radiosurgery 12/19/2017 to the new right parietal lobe metastases. 5.  Kadcyla: Received 16 cycles discontinued 05/05/2020 6.  Tucatinib Xeloda: Discontinued because of hand-foot syndrome 06/11/2020-12/15/2020 7. Herceptin/Neratinib: unable to tolerate due to diarrhea and weight loss 8.  SRS to new brain mets 10/01/2021 9.  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 Continue with Herceptin maintenance Upper respiratory infection: Without any fevers because of the Aspergillus of the lungs and her current symptoms, I will send prescription for azithromycin.  Patient's main complaints related to symptomatic tumor(s) are: (Per Dr. Liana Gerold note on 11-22-22) H+P (08/02/22) Patient presents today for headache evaluation. She describes pain "along the temples" mild to moderate, every day for the past month. Prior to this she had very sporadic migraine type headaches, which these differ from. No associated neurologic deficits. Also  describes some "shadowy visual loss" on the right side, at times.   Pain on a scale of 0-10 is: ***   If Spine Met(s), symptoms, if any, include: Bowel/Bladder retention or incontinence (please describe): *** Numbness or weakness in extremities (please describe): *** Current Decadron regimen, if applicable: ***  Ambulatory status? Walker? Wheelchair?: ***  SAFETY ISSUES: Prior radiation? Yes, First Treatment Date: 2022-10-28 - Last Treatment Date: 2022-11-08 [facility] brain)  Pacemaker/ICD? *** Possible current pregnancy? no Is the patient on methotrexate? no  Additional Complaints / other details:  ***

## 2023-01-24 ENCOUNTER — Other Ambulatory Visit: Payer: Self-pay | Admitting: *Deleted

## 2023-01-24 ENCOUNTER — Other Ambulatory Visit: Payer: Self-pay | Admitting: Radiation Therapy

## 2023-01-24 DIAGNOSIS — Z171 Estrogen receptor negative status [ER-]: Secondary | ICD-10-CM

## 2023-01-24 DIAGNOSIS — C7931 Secondary malignant neoplasm of brain: Secondary | ICD-10-CM

## 2023-01-24 MED ORDER — HEPARIN SOD (PORK) LOCK FLUSH 100 UNIT/ML IV SOLN: 500.0000 [IU] | Freq: Once | INTRAVENOUS | Status: DC

## 2023-01-24 MED ORDER — SODIUM CHLORIDE 0.9% FLUSH: 10.0000 mL | INTRAVENOUS | Status: DC | PRN

## 2023-01-24 MED ORDER — ALTEPLASE 2 MG IJ SOLR: 2.0000 mg | Freq: Once | INTRAMUSCULAR | Status: DC | PRN

## 2023-01-25 ENCOUNTER — Telehealth: Payer: Self-pay | Admitting: *Deleted

## 2023-01-25 NOTE — Telephone Encounter (Signed)
PC received from patient, she states she had craniotomy at [facility] on 12/29/22 & began having issues with her L arm upon discharge from the hospital on 12/31/22.  She states she has tingling & numbness from her L elbow down to her hand.  The sx's are worse in her hand, it is constant & she can hardly write.  She denies any warmth or edema.  She contacted her surgeon's office at [facility] & was informed she will need a U/S to rule out DVT.  Patient would prefer to have this done locally.  Informed patient I will pass her concerns on to Dr Barbaraann Cao. In the meantime, patient instructed to go to ED if she has any sudden chest pain or SOB, she verbalizes understanding.  Phone call routed to Dr Barbaraann Cao.

## 2023-01-26 ENCOUNTER — Other Ambulatory Visit: Payer: Self-pay | Admitting: Internal Medicine

## 2023-01-26 ENCOUNTER — Inpatient Hospital Stay: Payer: 59 | Admitting: Internal Medicine

## 2023-01-26 ENCOUNTER — Ambulatory Visit (HOSPITAL_COMMUNITY)
Admission: RE | Admit: 2023-01-26 | Discharge: 2023-01-26 | Disposition: A | Discharge status: Home / Self Care | Payer: 59 | Source: Ambulatory Visit | Attending: Internal Medicine | Admitting: Internal Medicine

## 2023-01-26 ENCOUNTER — Other Ambulatory Visit: Payer: Self-pay | Admitting: *Deleted

## 2023-01-26 ENCOUNTER — Ambulatory Visit
Admission: RE | Admit: 2023-01-26 | Discharge: 2023-01-26 | Disposition: A | Discharge status: Home / Self Care | Payer: 59 | Source: Ambulatory Visit | Attending: Adult Health | Admitting: Adult Health

## 2023-01-26 DIAGNOSIS — C50912 Malignant neoplasm of unspecified site of left female breast: Secondary | ICD-10-CM

## 2023-01-26 DIAGNOSIS — R200 Anesthesia of skin: Secondary | ICD-10-CM | POA: Diagnosis not present

## 2023-01-26 DIAGNOSIS — Z171 Estrogen receptor negative status [ER-]: Secondary | ICD-10-CM

## 2023-01-26 DIAGNOSIS — R202 Paresthesia of skin: Secondary | ICD-10-CM | POA: Insufficient documentation

## 2023-01-26 DIAGNOSIS — C7931 Secondary malignant neoplasm of brain: Secondary | ICD-10-CM

## 2023-01-26 DIAGNOSIS — S143XXA Injury of brachial plexus, initial encounter: Secondary | ICD-10-CM

## 2023-01-26 MED ORDER — HEPARIN SOD (PORK) LOCK FLUSH 100 UNIT/ML IV SOLN: 500.0000 [IU] | Freq: Once | INTRAVENOUS | Status: DC

## 2023-01-26 MED ORDER — IOPAMIDOL (ISOVUE-300) INJECTION 61%
100.0000 mL | Freq: Once | INTRAVENOUS | Status: AC | PRN
  Administered 2023-01-26: 100 mL via INTRAVENOUS

## 2023-01-26 MED ORDER — HEPARIN SOD (PORK) LOCK FLUSH 100 UNIT/ML IV SOLN: 500.0000 [IU] | Freq: Once | INTRAVENOUS | Status: AC

## 2023-01-26 MED ORDER — HEPARIN SOD (PORK) LOCK FLUSH 100 UNIT/ML IV SOLN
500.0000 [IU] | Freq: Once | INTRAVENOUS | Status: AC
  Administered 2023-01-26: 500 [IU] via INTRAVENOUS
  Filled 2023-01-26: qty 5

## 2023-01-26 MED ORDER — SODIUM CHLORIDE FLUSH 0.9 % IV SOLN
10.0000 mL | Freq: Once | INTRAVENOUS | Status: AC
  Administered 2023-01-26: 10 mL

## 2023-01-26 NOTE — Progress Notes (Signed)
I connected with Melissa [name] on 01/26/23 at  9:30 AM EDT by telephone visit and verified that I am speaking with the correct person using two identifiers.  I discussed the limitations, risks, security and privacy concerns of performing an evaluation and management service by telemedicine and the availability of in-person appointments. I also discussed with the patient that there may be a patient responsible charge related to this service. The patient expressed understanding and agreed to proceed.  Other persons participating in the visit and their role in the encounter:  n/a   Patient's location:  Home Provider's location:  Office Chief Complaint:  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 [facility])  History of Present Ilness: Melissa [name] describes numbness and tingling in her left arm and hand since her craniotomy.  Its described as "pins and needles in my fingertips, and runs up the side of my left arm to the elbow".  Severity fluctuates, but it was first noted after her brain metasasis resection at [facility].  Team there has recommended an ultrasound of the arm.  Observations: Language and cognition at baseline  Assessment and Plan: 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 [facility])  Symptoms localize to peripheral nerve or brachial plexus on the left.  Ok with proceeding with ultrasound to rule out post-op thrombosis or hematoma.  Deficits could be from compression injury intra-op as well.  Will defer EMG for now, we will see her in clinic following brain MRI next week.  Follow Up Instructions: RTC after scan  I discussed the assessment and treatment plan with the patient.  The patient was provided an opportunity to ask questions and all were answered.  The patient agreed with the plan and demonstrated understanding of the instructions.    The patient was advised to call back or seek an in-person evaluation if the symptoms worsen or if the condition fails to improve as anticipated.    Melissa Leber,  MD   I provided 22 minutes of non face-to-face telephone visit time during this encounter, and > 50% was spent counseling as documented under my assessment & plan.

## 2023-01-26 NOTE — Progress Notes (Signed)
Location/Histology of Brain Tumor: Brain Metastasis (Right Temporal Tumor)  10/26/2022 Dr. Margaretmary Dys MR Brain with/without Contrast CLINICAL DATA:  Brain metastasis. Assess treatment response. Recent surgery.  FINDINGS: Brain: The patient is status post left parietal craniotomy for resection of the medial occipital metastasis. Blood products are present within the surgical cavity. Increased peripheral enhancement of the cavity is likely reactive in nature. There is some nodular enhancement along the inferior and medial aspect of the cavity which raises concern for residual or recurrent tumor.   An 8 mm bilobed enhancing metastasis in the superior vermis is stable.   A right paramedian pontine metastasis is slightly increased in size compared 2 March 6 the, now measuring 10 x 6.5 mm, compared with 8 x 5.5 mm previously.   Enhancing tumor at the right temporal tip is slightly more prominent than on the prior study. It measures 32 x 24 mm on the sagittal postcontrast series compared with 32 x 21 mm previously. Vasogenic edema has increased slightly with further extension into the posterior limb of the internal capsule and more prominent edema within the right external capsule.   The right frontal resection cavity and surrounding edema is stable.   Enhancing tumor in the high medial right frontal lobe is stable measuring 12 mm. No new enhancing foci are present.   Diffusion-weighted images demonstrate no acute or subacute infarct.   Mass effect from right temporal vasogenic edema has increased. Midline shift now measures 5.2 mm compared with 2.5 mm on March 6th.   Vascular: Flow is present in the major intracranial arteries.   Skull and upper cervical spine: The craniocervical junction is normal. Upper cervical spine is within normal limits. Marrow signal is unremarkable.   Sinuses/Orbits: The paranasal sinuses and mastoid air cells are clear. The globes and orbits are  within normal limits.   IMPRESSION: 1. Status post left parietal craniotomy for resection of the medial left occipital metastasis. There is some nodular enhancement along the inferior and medial aspect of the cavity which raises concern for residual or recurrent tumor. Continued close attention on follow-up scans. 2. Slight increase in size of right paramedian pontine metastasis. 3. Slight increase in size of right temporal tip tumor with increased vasogenic edema and mass effect. Midline shift has increased from 2.5 mm to 5.2 mm. 4. Stable right frontal resection cavity and surrounding edema. 5. Stable 8 mm bilobed enhancing metastasis in the superior vermis. 6. No new enhancing foci.    09/14/2022 Dr. Elissa Hefty MR Brain with/without Contrast CLINICAL DATA: Brain/CNS neoplasm. Assess treatment response.   IMPRESSION: Overall less vasogenic edema in the right hemisphere with less mass effect when compared to the previous study.   New or enlarging lesions: Complex lesion at the right temporal tip axial image 55 with some growth along the posterior margin.  Continued enlargement of a medial left occipital lesion image 75 with considerable growth since the previous study. New 3 mm right parietal cortical lesion axial image 102.  All other lesions are stable.  Past or anticipated interventions, if any, per neurosurgery:  Craniotomy for resection of the left occipital lesion on 10/05/22 with Dr. Shaune Pollack- pathology revealing metastatic carcinoma consistent with breast origin.She was noted to have a R temporal lesion and had secondary craniotomy for right temporal tumor resection on 12/29/2022.  Past or anticipated interventions, if any, per medical oncology:   Dr. Elissa Hefty Assessment and Plan:  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 [facility]) Symptoms localize to peripheral nerve or brachial  plexus on the left.  Ok with proceeding with ultrasound to rule out post-op thrombosis or  hematoma.  Deficits could be from compression injury intra-op as well.  Will defer EMG for now, we will see her in clinic following brain MRI next week. Follow Up Instructions: RTC after scan  Dose of Decadron, if applicable:   Recent neurologic symptoms, if any:  Seizures: {:18581} Headaches: {:18581} Nausea: {:18581} Dizziness/ataxia: {:18581} Difficulty with hand coordination: {:18581} Focal numbness/weakness: {:18581} Visual deficits/changes: {:18581} Confusion/Memory deficits: {:18581}  Painful bone metastases at present, if any: {:18581}  SAFETY ISSUES: Prior radiation? {:18581} Pacemaker/ICD? {:18581} Possible current pregnancy? Postmenopausal (07/25/2012) Is the patient on methotrexate? No  Additional Complaints / other details:

## 2023-01-26 NOTE — Progress Notes (Signed)
Left upper extremity venous duplex has been completed. Preliminary results can be found in CV Proc through chart review.  Results were given to Darel Hong at Dr. Liana Gerold office.  01/26/23 1:14 PM Olen Cordial RVT

## 2023-01-29 NOTE — Progress Notes (Signed)
Patient Care Team: Thana Ates, MD as PCP - General (Internal Medicine) Domenick Gong, MD as Referring Physician (Emergency Medicine)  DIAGNOSIS: No diagnosis found.  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 Ki-67 was 70% HER-2 was amplified with a ratio 2.91   07/12/2012 - 07/17/2013 Neo-Adjuvant Chemotherapy   TCH 6 followed by Herceptin maintenance   12/11/2012 Surgery   Left breast lumpectomy: 1.8 cm tumor 1 positive sentinel node, axillary lymph node dissection 02/08/2013 showed 0/13 lymph nodes   03/25/2013 - 05/06/2013 Radiation Therapy   Adjuvant radiation therapy   06/05/2013 - 07/20/2017 Anti-estrogen oral therapy   Tamoxifen 20 mg daily   07/27/2017 Relapse/Recurrence   MRI Brain: 3.4 x 2.9 x 2.9 cm RIGHT frontal lobe mass with imaging characteristics of solitary metastasis. Extensive vasogenic edema resulting in 9 mm RIGHT to LEFT midline shift. Equivocal very early LEFT ventricle entrapment.    08/04/2017 Surgery   Rt frontal brain resection: Poorly differentiated tumor IHC suggests breast primary ER and PR Positive   08/25/2017 - 09/04/2017 Radiation Therapy   Stereotactic radiation   09/18/2017 -  Anti-estrogen oral therapy   Lapatinib with letrozole   12/18/2017 - 12/19/2017 Radiation Therapy   New right parietal lobe metastases status post [facility]   08/27/2018 - 08/27/2020 Radiation Therapy   SRS to new brain metastases   05/09/2019 Relapse/Recurrence   Interval increase in size of the enhancing nodular left internal mammary soft tissue 2.8 cm.  Redemonstrated enlarged supraclavicular, lower cervical and lower posterior cervical nodes unchanged.  Interval increase in the bony erosion of the  posterior and lateral left third rib, increasing soft tissue lesion eroding the left sternal body 3.1 cm was 2.5 cm.  Bronchiectatic changes   06/19/2019 - 05/05/2020 Chemotherapy   ado-trastuzumab emtansine (KADCYLA)     06/15/2020 -  Chemotherapy   Xeloda, Tucatinib, Herceptin    04/12/2022 - 09/06/2022 Chemotherapy   Patient is on Treatment Plan : BREAST Trastuzumab IV (8/6) or SQ (600) D1 q21d     09/27/2022 -  Chemotherapy   Patient is on Treatment Plan : BREAST MAINTENANCE Trastuzumab IV (6) or SQ (600) D1 q21d x 13 cycles     11/08/2022 Miscellaneous   Adding low-dose neratinib (80 mg) to Herceptin   12/20/2022 Cancer Staging   Staging form: Breast, AJCC 7th Edition - Pathologic: Stage IV (M1) - Signed by Loa Socks, NP on 12/20/2022   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/Initial Ca 1997, Brain Mets 2019  06/10/2019 Initial Diagnosis   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 with the above-mentioned history of metastatic breast cancer currently on treatment with Xeloda and Herceptin. She presents to the clinic today for follow-up.    ALLERGIES:  is allergic to aspirin, protonix [pantoprazole], doxycycline, promethazine-codeine, sulfamethoxazole-trimethoprim, and iodinated contrast media.  MEDICATIONS:  Current Outpatient  Medications  Medication Sig Dispense Refill   acetaminophen (TYLENOL) 325 MG tablet Take 2 tablets (650 mg total) by mouth every 6 (six) hours as needed for mild pain (or Fever >/= 101). 12 tablet 0   albuterol (VENTOLIN HFA) 108 (90 Base) MCG/ACT inhaler Inhale 2 puffs into the lungs every 6  (six) hours as needed for wheezing or shortness of breath (cough). 18 g 5   azithromycin (ZITHROMAX Z-PAK) 250 MG tablet As directed on box 6 each 0   calcium carbonate (TUMS EX) 750 MG chewable tablet Chew by mouth.     Cholecalciferol 250 MCG (10000 UT) CAPS Take by mouth.     Fluticasone-Salmeterol,sensor, (AIRDUO DIGIHALER) 113-14 MCG/ACT AEPB Inhale 1 puff into the lungs 2 (two) times daily. 1 each 4   GARLIC PO Take by mouth.     levalbuterol (XOPENEX) 0.63 MG/3ML nebulizer solution Take 3 mLs (0.63 mg total) by nebulization every 4 (four) hours as needed for wheezing or shortness of breath. 90 mL 12   lidocaine-prilocaine (EMLA) cream APPLY EXTERNALLY TO THE AFFECTED AREA 1 TIME 30 g 3   Multiple Vitamins-Minerals (EMERGEN-C VITAMIN C PO) Take by mouth.     OIL OF OREGANO PO Take 500 mg by mouth 2 (two) times daily.     ondansetron (ZOFRAN-ODT) 4 MG disintegrating tablet Take 4 mg by mouth every 8 (eight) hours as needed.     oxyCODONE (OXY IR/ROXICODONE) 5 MG immediate release tablet Take by mouth. (Patient not taking: Reported on 12/20/2022)     predniSONE (DELTASONE) 50 MG tablet Take one tablet 13 hrs prior to scan, 1 tablet 7 hrs prior to scan, 1 tablet 1 hr prior to scan. 3 tablet 0   SYMBICORT 160-4.5 MCG/ACT inhaler Inhale 2 puffs into the lungs 2 (two) times daily.     Vitamin E (VITAMIN E/D-ALPHA NATURAL) 268 MG (400 UNIT) CAPS Take by mouth.     voriconazole (VFEND) 200 MG tablet Take 1 tablet (200 mg total) by mouth 2 (two) times daily. 60 tablet 11   No current facility-administered medications for this visit.   Facility-Administered Medications Ordered in Other Visits  Medication Dose Route Frequency Provider Last Rate Last Admin   alteplase (CATHFLO ACTIVASE) injection 2 mg  2 mg Intracatheter Once PRN Serena Croissant, MD       sodium chloride flush (NS) 0.9 % injection 10 mL  10 mL Intracatheter PRN Serena Croissant, MD   10 mL at 07/06/21 1428    PHYSICAL EXAMINATION: ECOG  PERFORMANCE STATUS: {CHL ONC ECOG PS:[phone]}  There were no vitals filed for this visit. There were no vitals filed for this visit.  BREAST:*** No palpable masses or nodules in either right or left breasts. No palpable axillary supraclavicular or infraclavicular adenopathy no breast tenderness or nipple discharge. (exam performed in the presence of a chaperone)  LABORATORY DATA:  I have reviewed the data as listed    Latest Ref Rng & Units 11/29/2022    9:39 AM 05/24/2022   10:31 AM 04/12/2022    8:20 AM  CMP  Glucose 70 - 99 mg/dL 92  56  96   BUN 6 - 20 mg/dL 19  11  13    Creatinine 0.44 - 1.00 mg/dL 0.34  7.42  5.95   Sodium 135 - 145 mmol/L 140  139  138   Potassium 3.5 - 5.1 mmol/L 3.6  4.5  4.1   Chloride 98 - 111 mmol/L 106  104  106  CO2 22 - 32 mmol/L 28  31  29    Calcium 8.9 - 10.3 mg/dL 9.1  56.2  9.4   Total Protein 6.5 - 8.1 g/dL 6.5  8.2  7.2   Total Bilirubin 0.3 - 1.2 mg/dL 0.2  0.2  0.2   Alkaline Phos 38 - 126 U/L 62  74  71   AST 15 - 41 U/L 13  14  16    ALT 0 - 44 U/L 14  8  13      Lab Results  Component Value Date   WBC 12.1 (H) 11/29/2022   HGB 10.5 (L) 11/29/2022   HCT 32.2 (L) 11/29/2022   MCV 93.9 11/29/2022   PLT 247 11/29/2022   NEUTROABS 8.0 (H) 11/29/2022    ASSESSMENT & PLAN:  No problem-specific Assessment & Plan notes found for this encounter.    No orders of the defined types were placed in this encounter.  The patient has a good understanding of the overall plan. she agrees with it. she will call with any problems that may develop before the next visit here. Total time spent: 30 mins including face to face time and time spent for planning, charting and co-ordination of care   Sherlyn Lick, CMA 01/29/23    I Janan Ridge am acting as a Neurosurgeon for [employer]  ***

## 2023-01-30 ENCOUNTER — Other Ambulatory Visit: Payer: Self-pay

## 2023-01-30 ENCOUNTER — Ambulatory Visit
Admission: RE | Admit: 2023-01-30 | Discharge: 2023-01-30 | Disposition: A | Discharge status: Home / Self Care | Payer: 59 | Source: Ambulatory Visit | Attending: Radiation Oncology

## 2023-01-30 ENCOUNTER — Ambulatory Visit
Admission: RE | Admit: 2023-01-30 | Discharge: 2023-01-30 | Disposition: A | Discharge status: Home / Self Care | Payer: 59 | Source: Ambulatory Visit | Attending: Radiation Oncology | Admitting: Radiation Oncology

## 2023-01-30 ENCOUNTER — Encounter: Payer: Self-pay | Admitting: Radiation Oncology

## 2023-01-30 ENCOUNTER — Inpatient Hospital Stay: Payer: 59 | Admitting: Hematology and Oncology

## 2023-01-30 ENCOUNTER — Inpatient Hospital Stay: Payer: 59

## 2023-01-30 VITALS — BP 138/75 | HR 108 | Temp 97.8°F | Resp 18 | Ht 62.0 in | Wt 120.1 lb

## 2023-01-30 VITALS — BP 132/90 | HR 91 | Resp 16

## 2023-01-30 VITALS — BP 138/75 | HR 91 | Temp 97.8°F | Resp 18 | Ht 62.0 in | Wt 120.1 lb

## 2023-01-30 DIAGNOSIS — C7931 Secondary malignant neoplasm of brain: Secondary | ICD-10-CM

## 2023-01-30 DIAGNOSIS — C50912 Malignant neoplasm of unspecified site of left female breast: Secondary | ICD-10-CM

## 2023-01-30 DIAGNOSIS — Z171 Estrogen receptor negative status [ER-]: Secondary | ICD-10-CM

## 2023-01-30 DIAGNOSIS — C50212 Malignant neoplasm of upper-inner quadrant of left female breast: Secondary | ICD-10-CM

## 2023-01-30 MED ORDER — ACETAMINOPHEN 325 MG PO TABS
650.0000 mg | ORAL_TABLET | Freq: Once | ORAL | Status: AC
  Administered 2023-01-30: 650 mg via ORAL
  Filled 2023-01-30: qty 2

## 2023-01-30 MED ORDER — TRASTUZUMAB-ANNS CHEMO 150 MG IV SOLR
6.0000 mg/kg | Freq: Once | INTRAVENOUS | Status: AC
  Administered 2023-01-30: 300 mg via INTRAVENOUS
  Filled 2023-01-30: qty 14.29

## 2023-01-30 MED ORDER — LIDOCAINE-PRILOCAINE 2.5-2.5 % EX CREA
TOPICAL_CREAM | CUTANEOUS | 3 refills | Status: DC
Start: 2023-01-30 — End: 2023-01-30

## 2023-01-30 MED ORDER — LIDOCAINE-PRILOCAINE 2.5-2.5 % EX CREA: TOPICAL_CREAM | CUTANEOUS | 3 refills | Status: DC

## 2023-01-30 MED ORDER — SODIUM CHLORIDE 0.9% FLUSH
10.0000 mL | INTRAVENOUS | Status: DC | PRN
  Administered 2023-01-30: 10 mL

## 2023-01-30 MED ORDER — HEPARIN SOD (PORK) LOCK FLUSH 100 UNIT/ML IV SOLN
500.0000 [IU] | Freq: Once | INTRAVENOUS | Status: AC | PRN
  Administered 2023-01-30: 500 [IU]

## 2023-01-30 MED ORDER — DIPHENHYDRAMINE HCL 25 MG PO CAPS
25.0000 mg | ORAL_CAPSULE | Freq: Once | ORAL | Status: AC
  Administered 2023-01-30: 25 mg via ORAL
  Filled 2023-01-30: qty 1

## 2023-01-30 MED ORDER — SODIUM CHLORIDE 0.9 % IV SOLN: Freq: Once | INTRAVENOUS | Status: AC

## 2023-01-30 NOTE — Progress Notes (Signed)
  Radiation Oncology         [phone] ________________________________  Name: Melissa [name] MRN: [id number]  Date: 01/30/2023  DOB: 04/13/1966  SIMULATION AND TREATMENT PLANNING NOTE    ICD-10-CM   1. 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/Initial Ca 1997, Brain Mets 2019  C50.912    C79.31     2. Malignant neoplasm of upper-inner quadrant of left breast in female, estrogen receptor negative (HCC)  C50.212    Z17.1       DIAGNOSIS:  The patient is 57 yo woman with ER+ Her2+ cancer of the upper inner quadrant of the left breast with metastatic disease to the brain s/p resection of a right temporal lesion, measuring 32 mm at the site of previous SRS/metastasis   NARRATIVE:  The patient was brought to the CT Simulation planning suite.  Identity was confirmed.  All relevant records and images related to the planned course of therapy were reviewed.  The patient freely provided informed written consent to proceed with treatment after reviewing the details related to the planned course of therapy. The consent form was witnessed and verified by the simulation staff. Intravenous access was established for contrast administration. Then, the patient was set-up in a stable reproducible supine position for radiation therapy.  A relocatable thermoplastic stereotactic head frame was fabricated for precise immobilization.  CT images were obtained.  Surface markings were placed.  The CT images were loaded into the planning software and fused with the patient's targeting MRI scan.  Then the target and avoidance structures were contoured.  Treatment planning then occurred.  The radiation prescription was entered and confirmed.  I have requested 3D planning  I have requested a DVH of the following structures: Brain stem, brain, left eye, right eye, lenses, optic chiasm, target volumes, uninvolved brain, and normal tissue.    SPECIAL TREATMENT PROCEDURE:  The planned course of therapy using radiation  constitutes a special treatment procedure. Special care is required in the management of this patient for the following reasons. This treatment constitutes a Special Treatment Procedure for the following reason: High dose per fraction requiring special monitoring for increased toxicities of treatment including daily imaging.  The special nature of the planned course of radiotherapy will require increased physician supervision and oversight to ensure patient's safety with optimal treatment outcomes.  This requires extended time and effort.  PLAN:  The patient will receive 30 Gy in 5 fractions.  ________________________________  [name], M.D.

## 2023-01-30 NOTE — Progress Notes (Signed)
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    The encounter diagnosis was Secondary cancer of brain (HCC).    ICD-10-CM   1. Secondary cancer of brain [facility])  C79.31      Interval Since Last Radiation:  3 months   10/28/22 - 11/08/22: The left occipital 26 mm lesion was treated with 30 Gy in 5 fractions   10/18/2022: The right parietal 3 mm brain lesion was treated to a prescription dose of 20 Gy in 1 fraction   10/12/21: The 3 mm target in the right pons was treated to a prescription dose of 20 G in a single fraction.  06/29/21: These two targets were treated to a prescription dose of 20 G in a single fraction.    03/17/21: These three targets were treated to a prescription dose of 20 G in a single fraction.    08/27/20: These five targets were treated to a prescription dose of 20 G in a single fraction.    05/13/20:  SRS brain to the following 4 targets treated to a prescription dose of 20 Gy in a single fraction with a single isocenter:  PTV6 Mid Cerebellum 3mm PTV7 Lt Occipital 4mm PTV8 Lt Occipital 2mm PTV9 Lt Temporal 2mm    03/25/2019:  SRS brain//PTV 5:  Right frontal 5 mm target was treated to a prescription dose of 20 Gy in a single fraction.   09/04/2018:   SRS Brain// Right Frontal, 2 targets / 20 Gy in 1 fraction PTV3: Ant Rt Frontal 11mm 20Gy PTV4: Rt Frontal resection cavity 5mm  20Gy   12/18/2017: SRS brain//PTV2: 4 mm Rt Parietal lesion treated to 20 Gy in 1 Fx   08/25/2017, 08/28/2017, 08/30/2017, 09/01/2017, 09/04/2017: PTV1: post op SRS to right frontal lobe resection cavity in 5 fxs  Interval History :Melissa [name] is a 57 y.o. female  who returns today for follow-up following resection of right temporal lesion, measuring 5.2 mm at the site of previous SRS/metastasis. The craniotomy and resection at [facility] visualized residual nodular enhancement at the anterior aspect of the resection cavity. She returns today to discuss adjuvant radiation to the resection site.    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   Asthma    Breast cancer (HCC)    Bronchiectasis (HCC)    Bronchiolitis    Cancer (HCC)    breast cancer 2014   Cancer, metastatic to liver (HCC)    2021   Complication of anesthesia    bp dropped + desat    COPD (chronic obstructive pulmonary disease) (HCC)    Dyspnea    DOE   GERD (gastroesophageal reflux disease)    H/O coccidioidomycosis    was reason for lung lobectomy   Headache(784.0)    due to eye strain or not eating   History of anemia    no current problem   History of asthma    as a child   History of breast cancer 2014   left   History of chemotherapy    finished 07/17/2013   History of hiatal  hernia    AGE 60   Hx of radiation therapy 03/25/13-05/06/13   left breast 5000 cGy/25 sessions, left breast boost 1000 cGy/5 sessions   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   Pneumonia    LAST FLARE UP 01/2018   Rash 02/22/2021   Runny nose 07/30/2013   clear drainage   Smoker 05/24/2021   Wears dentures    upper  :   Past Surgical History:  Procedure Laterality Date   APPLICATION OF CRANIAL NAVIGATION N/A 08/04/2017   Procedure: APPLICATION OF CRANIAL NAVIGATION;  Surgeon: [name], Loura Halt, MD;  Location: [facility];  Service: Neurosurgery;  Laterality: N/A;   AXILLARY LYMPH NODE DISSECTION Left 02/08/2013   Procedure: LEFT AXILLARY DISSECTION;  Surgeon: Mariella Saa, MD;  Location: [facility];  Service: General;  Laterality: Left;   BREAST CYST EXCISION Right 2006   BREAST LUMPECTOMY Left 2014   BREAST LUMPECTOMY WITH  NEEDLE LOCALIZATION AND AXILLARY SENTINEL LYMPH NODE BX Left 12/31/2012   Procedure: NEEDLE LOCALIZATION LEFT BREAST LUMPECTOMY AND LEFT AXILLARY SENTENIAL LYMPH NODE BX;  Surgeon: Mariella Saa, MD;  Location: [facility];  Service: General;  Laterality: Left;   BRONCHIAL BRUSHINGS  03/17/2020   Procedure: BRONCHIAL BRUSHINGS;  Surgeon: Josephine Igo, DO;  Location: [facility];  Service: Pulmonary;;   BRONCHIAL WASHINGS  03/17/2020   Procedure: BRONCHIAL WASHINGS;  Surgeon: Josephine Igo, DO;  Location: [facility];  Service: Pulmonary;;   CESAREAN SECTION  1995/1996   CRANIOTOMY Right 08/04/2017   Procedure: Right Frontal craniotomy for resection of tumor with stereotactic navigation;  Surgeon: [name], Loura Halt, MD;  Location: [facility] OR;  Service: Neurosurgery;  Laterality: Right;  Right Frontal craniotomy for resection of tumor with stereotactic navigation   IR CV LINE INJECTION  02/19/2021   IR CV LINE INJECTION  11/12/2021   IR IMAGING GUIDED PORT INSERTION  05/31/2019   LUNG LOBECTOMY Left 05/1996   upper lobe - due to [facility] Fever   PORT-A-CATH REMOVAL Right 08/02/2013   Procedure: REMOVAL PORT-A-CATH;  Surgeon: Mariella Saa, MD;  Location: [facility];  Service: General;  Laterality: Right;   PORTACATH PLACEMENT  07/02/2012   Procedure: INSERTION PORT-A-CATH;  Surgeon: Mariella Saa, MD;  Location: [facility];  Service: General;  Laterality: N/A;  right   VIDEO BRONCHOSCOPY N/A 03/17/2020   Procedure: VIDEO BRONCHOSCOPY;  Surgeon: Josephine Igo, DO;  Location: [facility];  Service: Pulmonary;  Laterality: N/A;   VIDEO BRONCHOSCOPY WITH ENDOBRONCHIAL ULTRASOUND N/A 05/23/2018   Procedure: VIDEO BRONCHOSCOPY WITH ENDOBRONCHIAL ULTRASOUND;  Surgeon: Josephine Igo, DO;  Location: [facility];  Service: Thoracic;  Laterality: N/A;  :   Current Outpatient Medications:    acetaminophen (TYLENOL) 325 MG tablet, Take 2 tablets  (650 mg total) by mouth every 6 (six) hours as needed for mild pain (or Fever >/= 101)., Disp: 12 tablet, Rfl: 0   albuterol (VENTOLIN HFA) 108 (90 Base) MCG/ACT inhaler, Inhale 2 puffs into the lungs every 6 (six) hours as needed for wheezing or shortness of breath (cough)., Disp: 18 g, Rfl: 5   calcium carbonate (TUMS EX) 750 MG chewable tablet, Chew by mouth., Disp: , Rfl:    Cholecalciferol 250 MCG (10000 UT) CAPS, Take by mouth., Disp: , Rfl:    Fluticasone-Salmeterol,sensor, (AIRDUO DIGIHALER) 113-14 MCG/ACT AEPB, Inhale 1 puff into the lungs 2 (two) times daily., Disp: 1 each, Rfl: 4   GARLIC PO, Take by mouth., Disp: ,  Rfl:    levalbuterol (XOPENEX) 0.63 MG/3ML nebulizer solution, Take 3 mLs (0.63 mg total) by nebulization every 4 (four) hours as needed for wheezing or shortness of breath., Disp: 90 mL, Rfl: 12   lidocaine-prilocaine (EMLA) cream, APPLY EXTERNALLY TO THE AFFECTED AREA 1 TIME, Disp: 30 g, Rfl: 3   Multiple Vitamins-Minerals (EMERGEN-C VITAMIN C PO), Take by mouth., Disp: , Rfl:    naproxen sodium (ALEVE) 220 MG tablet, Take by mouth., Disp: , Rfl:    OIL OF OREGANO PO, Take 500 mg by mouth 2 (two) times daily., Disp: , Rfl:    ondansetron (ZOFRAN-ODT) 4 MG disintegrating tablet, Take 4 mg by mouth every 8 (eight) hours as needed., Disp: , Rfl:    SYMBICORT 160-4.5 MCG/ACT inhaler, Inhale 2 puffs into the lungs 2 (two) times daily., Disp: , Rfl:    Vitamin E (VITAMIN E/D-ALPHA NATURAL) 268 MG (400 UNIT) CAPS, Take by mouth., Disp: , Rfl:    voriconazole (VFEND) 200 MG tablet, Take 1 tablet (200 mg total) by mouth 2 (two) times daily., Disp: 60 tablet, Rfl: 11 No current facility-administered medications for this encounter.  Facility-Administered Medications Ordered in Other Encounters:    alteplase (CATHFLO ACTIVASE) injection 2 mg, 2 mg, Intracatheter, Once PRN, Serena Croissant, MD   sodium chloride flush (NS) 0.9 % injection 10 mL, 10 mL, Intracatheter, PRN, Serena Croissant,  MD, 10 mL at 07/06/21 1428:   Allergies  Allergen Reactions   Aspirin Anaphylaxis and Shortness Of Breath    THROAT CLOSES   Protonix [Pantoprazole] Nausea Only and Other (See Comments)    Also caused a "film in the mouth" and caused chest pressure   Doxycycline Other (See Comments)    Hallucinations, headaches   Promethazine-Codeine Cough    Worsening cough and insomnia    Sulfamethoxazole-Trimethoprim Other (See Comments)   Iodinated Contrast Media Rash    Patient allergic to contrast used in radiation oncology for CT simulation   :   Family History  Problem Relation Age of Onset   Emphysema Mother        was a smoker   Heart disease Mother    Melanoma Mother        dx in her 2s   Breast cancer Mother 74   Asthma Brother    Breast cancer Cousin        mother's maternal cousin; dx in her 34s  :   Social History   Socioeconomic History   Marital status: Married    Spouse name: Not on file   Number of children: 2   Years of education: Not on file   Highest education level: Not on file  Occupational History   Occupation: Investment banker, corporate  Tobacco Use   Smoking status: Every Day    Current packs/day: 0.30    Average packs/day: 0.3 packs/day for 38.0 years (11.4 ttl pk-yrs)    Types: Cigarettes   Smokeless tobacco: Never   Tobacco comments:    10 cigarettes smoked daily 10/19/20 [facility]   Vaping Use   Vaping status: Never Used  Substance and Sexual Activity   Alcohol use: No   Drug use: No   Sexual activity: Not Currently  Other Topics Concern   Not on file  Social History Narrative   Not on file   Social Determinants of Health   Financial Resource Strain: Low Risk  (01/03/2023)   [facility]   Overall Financial Resource Strain (CARDIA)  Difficulty of Paying Living Expenses: Not hard at all  Food Insecurity: No Food Insecurity (01/30/2023)   Hunger Vital Sign    Worried About Running Out of  Food in the Last Year: Never true    Ran Out of Food in the Last Year: Never true  Transportation Needs: No Transportation Needs (01/30/2023)   PRAPARE - Administrator, Civil Service (Medical): No    Lack of Transportation (Non-Medical): No  Physical Activity: Not on file  Stress: Not on file  Social Connections: Not on file  Intimate Partner Violence: Not At Risk (01/30/2023)   Humiliation, Afraid, Rape, and Kick questionnaire    Fear of Current or Ex-Partner: No    Emotionally Abused: No    Physically Abused: No    Sexually Abused: No     PHYSICAL EXAM:  Blood pressure 138/75, pulse 91, temperature 97.8 F (36.6 C), resp. rate 18, height 5\' 2"  (1.575 m), weight 120 lb 1.6 oz (54.5 kg), last menstrual period 07/25/2012, SpO2 97%. In general this is a well appearing female in no acute distress. She's alert and oriented x4 and appropriate throughout the examination. Cardiopulmonary assessment is negative for acute distress and she exhibits normal effort.     Craniotomy incision is healing well with no signs of delayed healing or infection.   KPS = 90  100 - Normal; no complaints; no evidence of disease. 90   - Able to carry on normal activity; minor signs or symptoms of disease. 80   - Normal activity with effort; some signs or symptoms of disease. 31   - Cares for self; unable to carry on normal activity or to do active work. 60   - Requires occasional assistance, but is able to care for most of his personal needs. 50   - Requires considerable assistance and frequent medical care. 40   - Disabled; requires special care and assistance. 30   - Severely disabled; hospital admission is indicated although death not imminent. 20   - Very sick; hospital admission necessary; active supportive treatment necessary. 10   - Moribund; fatal processes progressing rapidly. 0     - Dead  Karnofsky DA, Abelmann WH, Craver LS and Burchenal JH [phone]) The use of the nitrogen mustards in  the palliative treatment of carcinoma: with particular reference to bronchogenic carcinoma Cancer 1 634-56   LABORATORY DATA:  Lab Results  Component Value Date   WBC 12.1 (H) 11/29/2022   HGB 10.5 (L) 11/29/2022   HCT 32.2 (L) 11/29/2022   MCV 93.9 11/29/2022   PLT 247 11/29/2022   Lab Results  Component Value Date   NA 140 11/29/2022   K 3.6 11/29/2022   CL 106 11/29/2022   CO2 28 11/29/2022   Lab Results  Component Value Date   ALT 14 11/29/2022   AST 13 (L) 11/29/2022   ALKPHOS 62 11/29/2022   BILITOT 0.2 (L) 11/29/2022     RADIOGRAPHY: VAS [name] UPPER EXTREMITY VENOUS DUPLEX  Result Date: 01/26/2023 UPPER VENOUS STUDY  Patient Name:  Melissa [name] [facility]  Date of Exam:   01/26/2023 Medical Rec #: [id number]      Accession #:    [id number] Date of Birth: 1965-09-05      Patient Gender: F Patient Age:   61 years Exam Location:  [facility] Procedure:      VAS [name] UPPER EXTREMITY VENOUS DUPLEX Referring Phys: Earna Coder VASLOW --------------------------------------------------------------------------------  Indications: Numbness and tingling in left arm Risk Factors:  Cancer. Comparison Study: No prior studies. Performing Technologist: Chanda Busing RVT  Examination Guidelines: A complete evaluation includes B-mode imaging, spectral Doppler, color Doppler, and power Doppler as needed of all accessible portions of each vessel. Bilateral testing is considered an integral part of a complete examination. Limited examinations for reoccurring indications may be performed as noted.  Right Findings: +----------+------------+---------+-----------+----------+-------+ RIGHT     CompressiblePhasicitySpontaneousPropertiesSummary +----------+------------+---------+-----------+----------+-------+ Subclavian    Full       Yes       Yes                      +----------+------------+---------+-----------+----------+-------+  Left Findings:  +----------+------------+---------+-----------+----------+-------+ LEFT      CompressiblePhasicitySpontaneousPropertiesSummary +----------+------------+---------+-----------+----------+-------+ IJV           Full       Yes       Yes                      +----------+------------+---------+-----------+----------+-------+ Subclavian    Full       Yes       Yes                      +----------+------------+---------+-----------+----------+-------+ Axillary      Full       Yes       Yes                      +----------+------------+---------+-----------+----------+-------+ Brachial      Full                                          +----------+------------+---------+-----------+----------+-------+ Radial        Full                                          +----------+------------+---------+-----------+----------+-------+ Ulnar         Full                                          +----------+------------+---------+-----------+----------+-------+ Cephalic      Full                                          +----------+------------+---------+-----------+----------+-------+ Basilic       Full                                          +----------+------------+---------+-----------+----------+-------+  Summary:  Right: No evidence of thrombosis in the subclavian.  Left: No evidence of deep vein thrombosis in the upper extremity. No evidence of superficial vein thrombosis in the upper extremity.  *See table(s) above for measurements and observations.  Diagnosing physician: Heath Lark Electronically signed by Heath Lark on 01/26/2023 at 2:21:18 PM.    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 Follow-up MRI showed residual nodular  enhancement at the resection cavity, concerning for recurrent disease.  We discussed the nature of recurrent disease post-resection and the role radiotherapy plays in the treatment, specifically stereotactic radiosurgery. Stereotactic radiosurgery carries a higher likelihood for local tumor control at the targeted sites with lower associated risk for neurocognitive changes such as memory loss. However, the use of stereotactic radiosurgery in this setting may leave the patient at increased risk for new brain metastases elsewhere in the brain as high as 50-60%. Accordingly, patients who receive stereotactic radiosurgery in this setting should undergo ongoing surveillance imaging with brain MRI more frequently in order to identify and treat new small brain metastases before they become symptomatic. Stereotactic radiosurgery does carry some different risks, including a risk of radionecrosis. Patient expressed understanding of this treatment and would like to proceed.   PLAN: She is scheduled for CT simulation later today. 3T MRI is scheduled for 02/01/23. We anticipate administering 30 Gy in 5 fractions to the right temporal resection cavity, provided that no new findings emerge on 3T imaging.      I spent 40 minutes face to face with the patient and more than 50% of that time was spent in counseling and/or coordination of care.   ------------------------------------------------   Joyice Faster, PA-C    Margaretmary Dys, MD  [facility]  Radiation Oncology Direct Dial: [phone]  Fax: [phone] [email or web address]  Skype  LinkedIn

## 2023-01-30 NOTE — Assessment & Plan Note (Signed)
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  SRS brain: 08/25/2017-09/04/2017 2. Anti Her 2 therapy with Lapatinib started 09/17/2017-05/14/2019: Stopped for progression 3.  I discontinued tamoxifen and started her on letrozole 2.5 mg daily.  05/14/2019 stopped for progression 4.  Stereotactic radiosurgery 12/19/2017 to the new right parietal lobe metastases. 5.  Kadcyla: Received 16 cycles discontinued 05/05/2020 6.  Tucatinib Xeloda: Discontinued because of hand-foot syndrome 06/11/2020-12/15/2020 7. Herceptin/Neratinib: unable to tolerate due to diarrhea and weight loss 8.  SRS to new brain mets 10/01/2021 9.  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 Continue with Herceptin maintenance CT CAP  01/26/2023: Results pending

## 2023-01-30 NOTE — Patient Instructions (Signed)

## 2023-01-31 ENCOUNTER — Encounter: Payer: Self-pay | Admitting: Hematology and Oncology

## 2023-02-01 ENCOUNTER — Ambulatory Visit
Admission: RE | Admit: 2023-02-01 | Discharge: 2023-02-01 | Disposition: A | Discharge status: Home / Self Care | Payer: 59 | Source: Ambulatory Visit | Attending: Radiation Oncology | Admitting: Radiation Oncology

## 2023-02-01 DIAGNOSIS — C7949 Secondary malignant neoplasm of other parts of nervous system: Secondary | ICD-10-CM

## 2023-02-01 DIAGNOSIS — C7931 Secondary malignant neoplasm of brain: Secondary | ICD-10-CM

## 2023-02-01 MED ORDER — GADOPICLENOL 0.5 MMOL/ML IV SOLN
6.0000 mL | Freq: Once | INTRAVENOUS | Status: AC | PRN
  Administered 2023-02-01: 6 mL via INTRAVENOUS

## 2023-02-01 MED ORDER — HEPARIN SOD (PORK) LOCK FLUSH 100 UNIT/ML IV SOLN
500.0000 [IU] | Freq: Once | INTRAVENOUS | Status: AC
  Administered 2023-02-01: 500 [IU] via INTRAVENOUS

## 2023-02-01 MED ORDER — SODIUM CHLORIDE 0.9% FLUSH
10.0000 mL | INTRAVENOUS | Status: DC | PRN
  Administered 2023-02-01: 10 mL via INTRAVENOUS

## 2023-02-02 DIAGNOSIS — C50912 Malignant neoplasm of unspecified site of left female breast: Secondary | ICD-10-CM | POA: Diagnosis not present

## 2023-02-03 ENCOUNTER — Ambulatory Visit: Payer: 59 | Admitting: Radiation Oncology

## 2023-02-06 ENCOUNTER — Ambulatory Visit: Payer: 59 | Admitting: Radiation Oncology

## 2023-02-06 ENCOUNTER — Other Ambulatory Visit: Payer: Self-pay

## 2023-02-06 ENCOUNTER — Inpatient Hospital Stay (HOSPITAL_BASED_OUTPATIENT_CLINIC_OR_DEPARTMENT_OTHER): Payer: 59 | Admitting: Internal Medicine

## 2023-02-06 VITALS — BP 141/80 | HR 97 | Temp 98.0°F | Resp 14 | Ht 62.0 in | Wt 121.4 lb

## 2023-02-06 DIAGNOSIS — C7931 Secondary malignant neoplasm of brain: Secondary | ICD-10-CM | POA: Diagnosis not present

## 2023-02-06 DIAGNOSIS — C50912 Malignant neoplasm of unspecified site of left female breast: Secondary | ICD-10-CM | POA: Diagnosis not present

## 2023-02-06 MED ORDER — DEXAMETHASONE 2 MG PO TABS: 2.0000 mg | ORAL_TABLET | Freq: Every day | ORAL | 1 refills | Status: DC

## 2023-02-06 NOTE — Progress Notes (Signed)
[facility] [address]  [place], [place] [address] [phone]   Interval Evaluation  Date of Service: 02/06/23 Patient Name: Melissa [name] Patient MRN: [id number] Patient DOB: [birth year] Provider: Henreitta Leber, MD  Identifying Statement:  Melissa [name] is a 57 y.o. 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 Ki-67 was 70% HER-2 was amplified with a ratio 2.91   07/12/2012 - 07/17/2013 Neo-Adjuvant Chemotherapy   TCH 6 followed by Herceptin maintenance   12/11/2012 Surgery   Left breast lumpectomy: 1.8 cm tumor 1 positive sentinel node, axillary lymph node dissection 02/08/2013 showed 0/13 lymph nodes   03/25/2013 - 05/06/2013 Radiation Therapy   Adjuvant radiation therapy   06/05/2013 - 07/20/2017 Anti-estrogen oral therapy   Tamoxifen 20 mg daily   07/27/2017 Relapse/Recurrence   MRI Brain: 3.4 x 2.9 x 2.9 cm RIGHT frontal lobe mass with imaging characteristics of solitary metastasis. Extensive vasogenic edema resulting in 9 mm RIGHT to LEFT midline shift. Equivocal very early LEFT ventricle entrapment.    08/04/2017 Surgery   Rt frontal brain resection: Poorly differentiated tumor IHC suggests breast primary ER and PR Positive   08/25/2017 - 09/04/2017 Radiation Therapy   Stereotactic radiation   09/18/2017 -  Anti-estrogen oral therapy   Lapatinib with letrozole   12/18/2017 - 12/19/2017 Radiation Therapy   New right parietal lobe metastases status post [facility]   08/27/2018 - 08/27/2020 Radiation Therapy   SRS to new brain metastases   05/09/2019 Relapse/Recurrence   Interval increase in size of the enhancing  nodular left internal mammary soft tissue 2.8 cm.  Redemonstrated enlarged supraclavicular, lower cervical and lower posterior cervical nodes unchanged.  Interval increase in the bony erosion of the posterior and lateral left third rib, increasing soft tissue lesion eroding the left sternal body 3.1 cm was 2.5 cm.  Bronchiectatic changes   06/19/2019 - 05/05/2020 Chemotherapy   ado-trastuzumab emtansine (KADCYLA)     06/15/2020 -  Chemotherapy   Xeloda, Tucatinib, Herceptin    04/12/2022 - 09/06/2022 Chemotherapy   Patient is on Treatment Plan : BREAST Trastuzumab IV (8/6) or SQ (600) D1 q21d     09/27/2022 -  Chemotherapy   Patient is on Treatment Plan : BREAST MAINTENANCE Trastuzumab IV (6) or SQ (600) D1 q21d x 13 cycles     11/08/2022 Miscellaneous   Adding low-dose neratinib (80 mg) to Herceptin   12/20/2022 Cancer Staging   Staging form: Breast, AJCC 7th Edition - Pathologic: Stage IV (M1) - Signed by Loa Socks, NP on 12/20/2022   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  Path is 70% neoplasm 11/04/22: Post-op frx SRS L occipital Kathrynn Running) 10/06/22: Craniotomy, resection L occipital at [facility] (  [facility]), path is neoplasm 10/12/21: SRS to pons 16 Gy Kathrynn Running) 06/29/21: SRS to 2x L occipital 03/17/21: SRS x3 08/27/20: SRS x5 05/13/20: SRS x4  03/25/19: SRS right frontal 5 mm 09/04/18: SRS x2 right frontal 12/18/17: SRS R parietal 09/04/17: Post op SRS R frontal, 5 fxs 08/04/17: Craniotomy, resection R frontal met ([name])  Interval  History: Melissa [name] presents today for follow up after resection of progressive right temporal previously treated progressive metastasis on 6/20 with Dr. Shaune Pollack.  She describes some lingering tingling of fingertips in her left hand, but this has actually improved in recent days.  There are still head pain sensations following surgery, described as "lightning bolt" feeling which lasts for 3-5 seconds, occurs 2-3x per hour.  This has not improved with Tylenol or other pain medications.  Otherwise no new or progressive neurologic symptoms, remains functionally intact and independent with gait.  Continues on Kanjinti with Dr. Pamelia Hoit, tolerating it well.  Planning upcoming trip to beach with new camper.     She describes recurrence of daily headaches since surgery.  Has been dosing Tylenol or Aleve daily.  Sleep has been poor at times.  Steroids had been discontinued.  Her right sided visual symptoms have not recurred.  Otherwise no new or progressive changes.  H+P (08/02/22) Patient presents today for headache evaluation.  She describes pain "along the temples" mild to moderate, every day for the past month.  Prior to this she had very sporadic migraine type headaches, which these differ from.  No associated neurologic deficits.  She does dose codeine based cough syrup every night (for the past year), and has chronic sleep impairment.  Decadron 2mg  twice per day, started last week, has not helped any symptoms.  Also describes some "shadowy visual loss" on the right side, at times.  Continues on herceptin with Dr. Pamelia Hoit.  Medications: Current Outpatient Medications on File Prior to Visit  Medication Sig Dispense Refill   acetaminophen (TYLENOL) 325 MG tablet Take 2 tablets (650 mg total) by mouth every 6 (six) hours as needed for mild pain (or Fever >/= 101). 12 tablet 0   albuterol (VENTOLIN HFA) 108 (90 Base) MCG/ACT inhaler Inhale 2 puffs into the lungs every 6 (six) hours as needed for wheezing or  shortness of breath (cough). 18 g 5   calcium carbonate (TUMS EX) 750 MG chewable tablet Chew by mouth.     Cholecalciferol 250 MCG (10000 UT) CAPS Take by mouth.     Fluticasone-Salmeterol,sensor, (AIRDUO DIGIHALER) 113-14 MCG/ACT AEPB Inhale 1 puff into the lungs 2 (two) times daily. 1 each 4   GARLIC PO Take by mouth.     levalbuterol (XOPENEX) 0.63 MG/3ML nebulizer solution Take 3 mLs (0.63 mg total) by nebulization every 4 (four) hours as needed for wheezing or shortness of breath. 90 mL 12   lidocaine-prilocaine (EMLA) cream APPLY EXTERNALLY TO THE AFFECTED AREA 1 TIME 30 g 3   Multiple Vitamins-Minerals (EMERGEN-C VITAMIN C PO) Take by mouth.     naproxen sodium (ALEVE) 220 MG tablet Take by mouth.     OIL OF OREGANO PO Take 500 mg by mouth 2 (two) times daily.     ondansetron (ZOFRAN-ODT) 4 MG disintegrating tablet Take 4 mg by mouth every 8 (eight) hours as needed.     SYMBICORT 160-4.5 MCG/ACT inhaler Inhale 2 puffs into the lungs 2 (two) times daily.     Vitamin E (VITAMIN E/D-ALPHA NATURAL) 268 MG (400 UNIT) CAPS Take by mouth.  voriconazole (VFEND) 200 MG tablet Take 1 tablet (200 mg total) by mouth 2 (two) times daily. 60 tablet 11   Current Facility-Administered Medications on File Prior to Visit  Medication Dose Route Frequency Provider Last Rate Last Admin   alteplase (CATHFLO ACTIVASE) injection 2 mg  2 mg Intracatheter Once PRN Serena Croissant, MD       sodium chloride flush (NS) 0.9 % injection 10 mL  10 mL Intracatheter PRN Serena Croissant, MD   10 mL at 07/06/21 1428    Allergies:  Allergies  Allergen Reactions   Aspirin Anaphylaxis and Shortness Of Breath    THROAT CLOSES   Protonix [Pantoprazole] Nausea Only and Other (See Comments)    Also caused a "film in the mouth" and caused chest pressure   Doxycycline Other (See Comments)    Hallucinations, headaches   Promethazine-Codeine Cough    Worsening cough and insomnia    Sulfamethoxazole-Trimethoprim Other (See  Comments)   Iodinated Contrast Media Rash    Patient allergic to contrast used in radiation oncology for CT simulation    Past Medical History:  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   Asthma    Breast cancer (HCC)    Bronchiectasis (HCC)    Bronchiolitis    Cancer (HCC)    breast cancer 2014   Cancer, metastatic to liver (HCC)    2021   Complication of anesthesia    bp dropped + desat    COPD (chronic obstructive pulmonary disease) (HCC)    Dyspnea    DOE   GERD (gastroesophageal reflux disease)    H/O coccidioidomycosis    was reason for lung lobectomy   Headache(784.0)    due to eye strain or not eating   History of anemia    no current problem   History of asthma    as a child   History of breast cancer 2014   left   History of chemotherapy    finished 07/17/2013   History of hiatal hernia    AGE 55   Hx of radiation therapy 03/25/13-05/06/13   left breast 5000 cGy/25 sessions, left breast boost 1000 cGy/5 sessions   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   Pneumonia    LAST FLARE UP 01/2018   Rash 02/22/2021   Runny nose 07/30/2013   clear drainage   Smoker 05/24/2021   Wears dentures    upper   Past Surgical History:  Past Surgical History:  Procedure Laterality Date   APPLICATION OF CRANIAL NAVIGATION N/A 08/04/2017   Procedure: APPLICATION OF CRANIAL NAVIGATION;  Surgeon: [name], Loura Halt, MD;  Location: [facility] OR;  Service: Neurosurgery;  Laterality: N/A;   AXILLARY LYMPH NODE DISSECTION Left 02/08/2013   Procedure: LEFT AXILLARY DISSECTION;  Surgeon: Mariella Saa, MD;  Location: [facility];  Service: General;  Laterality: Left;   BREAST CYST EXCISION Right 2006   BREAST LUMPECTOMY Left 2014   BREAST LUMPECTOMY WITH NEEDLE LOCALIZATION AND AXILLARY SENTINEL LYMPH NODE BX Left 12/31/2012   Procedure: NEEDLE LOCALIZATION LEFT BREAST LUMPECTOMY AND LEFT AXILLARY SENTENIAL LYMPH NODE  BX;  Surgeon: Mariella Saa, MD;  Location:  SURGERY CENTER;  Service: General;  Laterality: Left;   BRONCHIAL BRUSHINGS  03/17/2020   Procedure: BRONCHIAL BRUSHINGS;  Surgeon: Josephine Igo, DO;  Location: [facility];  Service: Pulmonary;;   BRONCHIAL WASHINGS  03/17/2020   Procedure: BRONCHIAL WASHINGS;  Surgeon: Josephine Igo, DO;  Location: [facility];  Service: Pulmonary;;   CESAREAN SECTION  1995/1996   CRANIOTOMY Right 08/04/2017   Procedure: Right Frontal craniotomy for resection of tumor with stereotactic navigation;  Surgeon: [name], Loura Halt, MD;  Location: [facility] OR;  Service: Neurosurgery;  Laterality: Right;  Right Frontal craniotomy for resection of tumor with stereotactic navigation   IR CV LINE INJECTION  02/19/2021   IR CV LINE INJECTION  11/12/2021   IR IMAGING GUIDED PORT INSERTION  05/31/2019   LUNG LOBECTOMY Left 05/1996   upper lobe - due to [facility] Fever   PORT-A-CATH REMOVAL Right 08/02/2013   Procedure: REMOVAL PORT-A-CATH;  Surgeon: Mariella Saa, MD;  Location: [facility];  Service: General;  Laterality: Right;   PORTACATH PLACEMENT  07/02/2012   Procedure: INSERTION PORT-A-CATH;  Surgeon: Mariella Saa, MD;  Location: [facility];  Service: General;  Laterality: N/A;  right   VIDEO BRONCHOSCOPY N/A 03/17/2020   Procedure: VIDEO BRONCHOSCOPY;  Surgeon: Josephine Igo, DO;  Location: [facility];  Service: Pulmonary;  Laterality: N/A;   VIDEO BRONCHOSCOPY WITH ENDOBRONCHIAL ULTRASOUND N/A 05/23/2018   Procedure: VIDEO BRONCHOSCOPY WITH ENDOBRONCHIAL ULTRASOUND;  Surgeon: Josephine Igo, DO;  Location: [facility] OR;  Service: Thoracic;  Laterality: N/A;   Social History:  Social History   Socioeconomic History   Marital status: Married    Spouse name: Not on file   Number of children: 2   Years of education: Not on file   Highest education level: Not on file  Occupational History   Occupation: Occupational hygienist  Tobacco Use   Smoking status: Every Day    Current packs/day: 0.30    Average packs/day: 0.3 packs/day for 38.0 years (11.4 ttl pk-yrs)    Types: Cigarettes   Smokeless tobacco: Never   Tobacco comments:    10 cigarettes smoked daily 10/19/20 [facility]   Vaping Use   Vaping status: Never Used  Substance and Sexual Activity   Alcohol use: No   Drug use: No   Sexual activity: Not Currently  Other Topics Concern   Not on file  Social History Narrative   Not on file   Social Determinants of Health   Financial Resource Strain: Low Risk  (01/03/2023)   Received from [facility]   Overall Financial Resource Strain (CARDIA)    Difficulty of Paying Living Expenses: Not hard at all  Food Insecurity: No Food Insecurity (01/30/2023)   Hunger Vital Sign    Worried About Running Out of Food in the Last Year: Never true    Ran Out of Food in the Last Year: Never true  Transportation Needs: No Transportation Needs (01/30/2023)   PRAPARE - Administrator, Civil Service (Medical): No    Lack of Transportation (Non-Medical): No  Physical Activity: Not on file  Stress: Not on file  Social Connections: Not on file  Intimate Partner Violence: Not At Risk (01/30/2023)   Humiliation, Afraid, Rape, and Kick questionnaire    Fear of Current or Ex-Partner: No    Emotionally Abused: No    Physically Abused: No    Sexually Abused: No   Family History:  Family History  Problem Relation Age of Onset   Emphysema Mother        was a smoker   Heart disease Mother    Melanoma Mother        dx in her 69s  Breast cancer Mother 8   Asthma Brother    Breast cancer Cousin        mother's maternal cousin; dx in her 74s    Review of Systems: Constitutional: Doesn't report fevers, chills or abnormal weight loss Eyes: Doesn't report blurriness of vision Ears, nose, mouth, throat, and face: Doesn't report sore throat Respiratory: Doesn't  report cough, dyspnea or wheezes Cardiovascular: Doesn't report palpitation, chest discomfort  Gastrointestinal:  Doesn't report nausea, constipation, diarrhea GU: Doesn't report incontinence Skin: Doesn't report skin rashes Neurological: Per HPI Musculoskeletal: Doesn't report joint pain Behavioral/Psych: Doesn't report anxiety  Physical Exam: Vitals:   02/06/23 0904  BP: (!) 141/80  Pulse: 97  Resp: 14  Temp: 98 F (36.7 C)  SpO2: 98%    KPS: 80. General: Alert, cooperative, pleasant, in no acute distress Head: Normal EENT: No conjunctival injection or scleral icterus.  Lungs: Resp effort normal Cardiac: Regular rate Abdomen: Non-distended abdomen Skin: No rashes cyanosis or petechiae. Extremities: No clubbing or edema  Neurologic Exam: Mental Status: Awake, alert, attentive to examiner. Oriented to self and environment. Language is fluent with intact comprehension.  Cranial Nerves: Visual acuity is grossly normal. Visual fields are full. Extra-ocular movements intact. No ptosis. Face is symmetric Motor: Tone and bulk are normal. Power is full in both arms and legs. Reflexes are symmetric, no pathologic reflexes present.  Sensory: Intact to light touch Gait: Normal.   Labs: I have reviewed the data as listed    Component Value Date/Time   NA 140 11/29/2022 0939   NA 142 08/12/2014 0956   K 3.6 11/29/2022 0939   K 4.6 08/12/2014 0956   CL 106 11/29/2022 0939   CL 100 12/20/2012 1509   CO2 28 11/29/2022 0939   CO2 26 08/12/2014 0956   GLUCOSE 92 11/29/2022 0939   GLUCOSE 94 08/12/2014 0956   GLUCOSE 98 12/20/2012 1509   BUN 19 11/29/2022 0939   BUN 10.1 08/12/2014 0956   CREATININE 0.76 11/29/2022 0939   CREATININE 0.88 03/11/2019 1057   CREATININE 0.9 08/12/2014 0956   CALCIUM 9.1 11/29/2022 0939   CALCIUM 9.3 08/12/2014 0956   PROT 6.5 11/29/2022 0939   PROT 7.1 08/12/2014 0956   ALBUMIN 3.6 11/29/2022 0939   ALBUMIN 3.8 08/12/2014 0956   AST 13 (L)  11/29/2022 0939   AST 15 08/12/2014 0956   ALT 14 11/29/2022 0939   ALT 11 08/12/2014 0956   ALKPHOS 62 11/29/2022 0939   ALKPHOS 68 08/12/2014 0956   BILITOT 0.2 (L) 11/29/2022 0939   BILITOT 0.25 08/12/2014 0956   GFRNONAA >60 11/29/2022 0939   GFRNONAA 76 03/11/2019 1057   GFRAA >60 03/24/2020 1012   GFRAA >60 02/11/2020 0836   GFRAA 88 03/11/2019 1057   Lab Results  Component Value Date   WBC 12.1 (H) 11/29/2022   NEUTROABS 8.0 (H) 11/29/2022   HGB 10.5 (L) 11/29/2022   HCT 32.2 (L) 11/29/2022   MCV 93.9 11/29/2022   PLT 247 11/29/2022    Imaging:  MR Brain W Wo Contrast  Result Date: 02/01/2023 CLINICAL DATA:  Brain metastases.  Assess treatment response. EXAM: MRI HEAD WITHOUT AND WITH CONTRAST TECHNIQUE: Multiplanar, multiecho pulse sequences of the brain and surrounding structures were obtained without and with intravenous contrast. CONTRAST:  6 cc Vueway COMPARISON:  10/26/2022.  01/02/2023. 10/07/2022.  09/14/2022. FINDINGS: Brain: Slight further enlargement of an enhancing mass in the central pons, now measuring up to 13 x 10 mm. Worsening of edema throughout  the pons. Stable or slight increase in nodular enhancement at the superior cerebellar vermis. No evidence of increasing edema. Stable punctate focus of enhancement in the left cerebellum axial image 26 series 14. Much less vasogenic edema is seen throughout the right cerebral hemisphere compared to the immediate prior exam. Thick-walled enhancement around the region of resection at the right temporal tip most consistent with post treatment enhancement. Impossible rule out residual/recurrent tumor, but treatment effect is favored. Continued follow-up is obviously warranted. Improved appearance in the left occipital region subsequent to lesion resection. Post treatment space is stable. Minimal marginal enhancement is present without worrisome configuration. Post treatment enhancing foci in the inferior right frontal lobe and  anterior right frontal lobe are stable. Post treatment enhancement at the medial right frontoparietal vertex is stable. Punctate focus of enhancement in the inferior left occipital lobe axial image 50 is stable over time. Resolution of right to left shift because of the marked improvement in right hemisphere edema. No hydrocephalus. No extra-axial fluid collection. No ischemic infarction. Vascular: Major vessels at the base of the brain show flow. Skull and upper cervical spine: Postsurgical changes. No other finding of note. Sinuses/Orbits: Mild mucosal thickening. No advanced sinus disease. Orbits negative. Other: None IMPRESSION: 1. Slight further enlargement of an enhancing mass in the central pons, now measuring up to 13 x 10 mm. Worsening of edema throughout the pons. Stable or slight increase in nodular enhancement at the superior cerebellar vermis. Stable punctate focus of enhancement in the left cerebellum. 2. Much less vasogenic edema throughout the right cerebral hemisphere compared to the immediate prior exam. Thick-walled enhancement around the region of resection at the right temporal tip most consistent with post treatment enhancement. Impossible rule out residual/recurrent tumor, but treatment effect is favored. Continued follow-up is obviously warranted. 3. Improved appearance in the left occipital region subsequent to lesion resection. Minimal marginal enhancement is present without worrisome configuration. Continued follow-up warranted. 4. Stable punctate focus of enhancement in the inferior left occipital lobe axial. 5. Stable post treatment changes in the inferior right frontal lobe, anterior right frontal lobe and right frontoparietal vertex. Electronically Signed   By: Paulina Fusi M.D.   On: 02/01/2023 14:54   CT CHEST ABDOMEN PELVIS W CONTRAST  Result Date: 01/31/2023 CLINICAL DATA:  Metastatic left breast cancer restaging, brain and liver metastatic disease additional history of remote  prior left upper lobectomy secondary to infection * Tracking Code: BO * EXAM: CT CHEST, ABDOMEN, AND PELVIS WITH CONTRAST TECHNIQUE: Multidetector CT imaging of the chest, abdomen and pelvis was performed following the standard protocol during bolus administration of intravenous contrast. RADIATION DOSE REDUCTION: This exam was performed according to the departmental dose-optimization program which includes automated exposure control, adjustment of the mA and/or kV according to patient size and/or use of iterative reconstruction technique. CONTRAST:  ISOVUE-300 IOPAMIDOL (ISOVUE-300) INJECTION 61% additional oral enteric contrast COMPARISON:  06/13/2022 FINDINGS: CT CHEST FINDINGS Cardiovascular: Right chest port catheter. Normal heart size. No pericardial effusion. Mediastinum/Nodes: Status post left axillary lymph node dissection. No enlarged mediastinal, hilar, or axillary lymph nodes. Thyroid gland, trachea, and esophagus demonstrate no significant findings. Lungs/Pleura: Unchanged appearance of the chest. Status post left upper lobectomy with very extensive cystic bronchiectasis and scarring throughout the remaining left lower lobe. Mild underlying centrilobular and paraseptal emphysema with diffuse centrilobular nodularity throughout the lungs. New, nonspecific infectious or inflammatory ground-glass airspace opacity in the dependent right lower lobe (series 4, image 78). No pleural effusion or pneumothorax. Musculoskeletal:  No chest wall abnormality. No acute osseous findings. CT ABDOMEN PELVIS FINDINGS Hepatobiliary: Unchanged hypodense lesion of hepatic segment V adjacent to the gallbladder fossa measuring 1.5 x 1.1 cm with overlying capsular retraction (series 2, image 69). Hepatic steatosis. No gallstones, gallbladder wall thickening, or biliary dilatation. Pancreas: Unremarkable. No pancreatic ductal dilatation or surrounding inflammatory changes. Spleen: Normal in size without significant  abnormality. Adrenals/Urinary Tract: Adrenal glands are unremarkable. Kidneys are normal, without renal calculi, solid lesion, or hydronephrosis. Bladder is unremarkable. Stomach/Bowel: Stomach is within normal limits. Appendix appears normal. No evidence of bowel wall thickening, distention, or inflammatory changes. Sigmoid diverticulosis. Vascular/Lymphatic: Mild aortic atherosclerosis. No enlarged abdominal or pelvic lymph nodes. Reproductive: No mass or other abnormality. Other: No abdominal wall hernia or abnormality. No ascites. Musculoskeletal: No acute osseous findings. IMPRESSION: 1. Unchanged hypodense lesion of hepatic segment V adjacent to the gallbladder fossa with overlying capsular retraction, consistent with treated metastatic disease. No new liver lesions. 2. No evidence of new metastatic disease in the chest, abdomen, or pelvis. 3. Status post left upper lobectomy with very extensive chronic cystic bronchiectasis and scarring throughout the remaining left lower lobe. 4. Mild underlying centrilobular and paraseptal emphysema with diffuse centrilobular nodularity throughout the lungs, consistent with smoking related respiratory bronchiolitis. 5. New, nonspecific infectious or inflammatory ground-glass airspace opacity in the dependent right lower lobe. Attention on follow-up. 6. Hepatic steatosis. 7. Sigmoid diverticulosis. Aortic Atherosclerosis (ICD10-I70.0) and Emphysema (ICD10-J43.9). Electronically Signed   By: Jearld Lesch M.D.   On: 01/31/2023 07:40   VAS [name] UPPER EXTREMITY VENOUS DUPLEX  Result Date: 01/26/2023 UPPER VENOUS STUDY  Patient Name:  Melissa [name] [facility]  Date of Exam:   01/26/2023 Medical Rec #: [id number]      Accession #:    [id number] Date of Birth: December 25, 1965      Patient Gender: F Patient Age:   21 years Exam Location:  [facility] Procedure:      VAS [name] UPPER EXTREMITY VENOUS DUPLEX Referring Phys: Earna Coder Armeda Plumb  --------------------------------------------------------------------------------  Indications: Numbness and tingling in left arm Risk Factors: Cancer. Comparison Study: No prior studies. Performing Technologist: Chanda Busing RVT  Examination Guidelines: A complete evaluation includes B-mode imaging, spectral Doppler, color Doppler, and power Doppler as needed of all accessible portions of each vessel. Bilateral testing is considered an integral part of a complete examination. Limited examinations for reoccurring indications may be performed as noted.  Right Findings: +----------+------------+---------+-----------+----------+-------+ RIGHT     CompressiblePhasicitySpontaneousPropertiesSummary +----------+------------+---------+-----------+----------+-------+ Subclavian    Full       Yes       Yes                      +----------+------------+---------+-----------+----------+-------+  Left Findings: +----------+------------+---------+-----------+----------+-------+ LEFT      CompressiblePhasicitySpontaneousPropertiesSummary +----------+------------+---------+-----------+----------+-------+ IJV           Full       Yes       Yes                      +----------+------------+---------+-----------+----------+-------+ Subclavian    Full       Yes       Yes                      +----------+------------+---------+-----------+----------+-------+ Axillary      Full       Yes       Yes                      +----------+------------+---------+-----------+----------+-------+  Brachial      Full                                          +----------+------------+---------+-----------+----------+-------+ Radial        Full                                          +----------+------------+---------+-----------+----------+-------+ Ulnar         Full                                          +----------+------------+---------+-----------+----------+-------+ Cephalic       Full                                          +----------+------------+---------+-----------+----------+-------+ Basilic       Full                                          +----------+------------+---------+-----------+----------+-------+  Summary:  Right: No evidence of thrombosis in the subclavian.  Left: No evidence of deep vein thrombosis in the upper extremity. No evidence of superficial vein thrombosis in the upper extremity.  *See table(s) above for measurements and observations.  Diagnosing physician: Heath Lark Electronically signed by Heath Lark on 01/26/2023 at 2:21:18 PM.    Final     [facility] Clinician Interpretation: I have personally reviewed the radiological images as listed.  My interpretation, in the context of the patient's clinical presentation, is progressive disease   Assessment/Plan Metastasis to brain [facility])  Ivory Broad Raj is clinically stable today from focal neurologic standpiont.  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    We are concerned about recurrence of neoplasm here given two histologic CNS samples obtained in past year were positive for tumor or mixed treatment effect.    Case was reviewed in CNS tumor board; recommendation was for salvage radiosurgery, fractionated.  The right temporal resection cavity will also be treated concurrently.  Because of high risk for radiation necrosis and highly sensitive location, additionally recommended coverage with avastin 10mg /kg IV q3 weeks x4 cycles.  Avastin will help prevent acute and subacute inflammatory which could potentially lead to high symptom burden and disability.  This may be given concurrently with HER2 therapy, next scheduled for 02/21/23 with Dr. Pamelia Hoit.  We reviewed potential side effects of Avastin, including hypertension, bleeding/clotting events, impaired wound healing.   She is agreeable to proceed, we will discuss with Dr. Pamelia Hoit  and seek his approval for addition of avastin to existing treatment plan.     Radiation treatment is scheduled to begin this week with Dr. Kathrynn Running pending insurance approval.  Recommended resuming decadron at 2mg  daily given brainstem inflammation; this may also help with headaches and neuropathic pain described in history.  We appreciate the opportunity to participate in the care of Melissa [name].   We ask that Melissa [name] return to clinic in 2 months following  post-SRS brain MRI and 2 cycles of avastin, or sooner if needed.  All questions were answered. The patient knows to call the clinic with any problems, questions or concerns. No barriers to learning were detected.  The total time spent in the encounter was 40 minutes and more than 50% was on counseling and review of test results   Henreitta Leber, MD Medical Director of Neuro-Oncology [facility] at [place] 02/06/23 8:53 AM

## 2023-02-07 ENCOUNTER — Other Ambulatory Visit: Payer: Self-pay

## 2023-02-08 ENCOUNTER — Ambulatory Visit: Payer: 59 | Admitting: Radiation Oncology

## 2023-02-09 ENCOUNTER — Telehealth: Payer: Self-pay | Admitting: Radiation Therapy

## 2023-02-09 NOTE — Telephone Encounter (Signed)
Called to let Evarose know that her new insurance does not require pre-auth for radiation treatment. We are scheduled to begin treatment Friday 8/2 @ 8:30. Dorothia was very happy to hear the good news and will be here.   Jalene Mullet R.T.(R)(T) Radiation Special Procedures Navigator

## 2023-02-10 ENCOUNTER — Other Ambulatory Visit: Payer: Self-pay

## 2023-02-10 ENCOUNTER — Ambulatory Visit
Admission: RE | Admit: 2023-02-10 | Discharge: 2023-02-10 | Disposition: A | Discharge status: Home / Self Care | Payer: Commercial Managed Care - HMO | Source: Ambulatory Visit | Attending: Radiation Oncology | Admitting: Radiation Oncology

## 2023-02-10 DIAGNOSIS — C7931 Secondary malignant neoplasm of brain: Secondary | ICD-10-CM | POA: Diagnosis present

## 2023-02-10 DIAGNOSIS — Z510 Encounter for antineoplastic radiation therapy: Secondary | ICD-10-CM | POA: Diagnosis not present

## 2023-02-10 DIAGNOSIS — C50212 Malignant neoplasm of upper-inner quadrant of left female breast: Secondary | ICD-10-CM | POA: Insufficient documentation

## 2023-02-10 LAB — RAD ONC ARIA SESSION SUMMARY
Course Elapsed Days: 0
Plan Fractions Treated to Date: 1
Plan Prescribed Dose Per Fraction: 6 Gy
Plan Total Fractions Prescribed: 5
Plan Total Prescribed Dose: 30 Gy
Reference Point Dosage Given to Date: 6 Gy
Reference Point Session Dosage Given: 6 Gy
Session Number: 1

## 2023-02-10 NOTE — Progress Notes (Signed)
Patient to nursing for 15 minute observation SRS Brain.  Alert and verbally responsive.  Reports mild headache is taking Tylenol to help ease discomfort.  Ringing in the right ear occasionally.  Denies fatigue, visual changes, nausea and skin irritation. Occasional imbalance when walking and some short term memory loss.  Decadron 2 mg po daily started on 02/06/2023.  Patient said that Dr. Barbaraann Cao will let her know when to stop medication.

## 2023-02-12 NOTE — Progress Notes (Signed)
  Radiation Oncology         [phone] ________________________________  Stereotactic Treatment Procedure Note  Name: LIANE TRIBBEY MRN: [id number]  Date: 02/10/2023  DOB: [birth year]  SPECIAL TREATMENT PROCEDURE    ICD-10-CM   1. 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 (HCC)  C79.31     2. Metastasis to brain [facility])  C79.31       3D TREATMENT PLANNING AND DOSIMETRY:  The patient's radiation plan was reviewed and approved by neurosurgery and radiation oncology prior to treatment.  It showed 3-dimensional radiation distributions overlaid onto the planning CT/MRI image set.  The [facility] for the target structures as well as the organs at risk were reviewed. The documentation of the 3D plan and dosimetry are filed in the radiation oncology EMR.  NARRATIVE:  TEHYA LEATH was brought to the TrueBeam stereotactic radiation treatment machine and placed supine on the CT couch. The head frame was applied, and the patient was set up for stereotactic radiosurgery.  Neurosurgery was present for the set-up and delivery  SIMULATION VERIFICATION:  In the couch zero-angle position, the patient underwent Exactrac imaging using the Brainlab system with orthogonal KV images.  These were carefully aligned and repeated to confirm treatment position for each of the isocenters.  The Exactrac snap film verification was repeated at each couch angle.  PROCEDURE: Ivory Broad Streicher received stereotactic radiosurgery to the following targets: The Right Temporal 40 mm post-op cavity and Pontine 13 mm lesion were treated using 5 Rapid Arc VMAT Beams to a prescription dose of 6 and 5 Gy, respectively, to be repeated for 5 total fractions achieving a total dose of 30 and 25 Gy.  ExacTrac registration was performed for each couch angle.  The 100% isodose line was prescribed.  6 MV X-rays were delivered in the flattening filter free beam mode.  STEREOTACTIC TREATMENT MANAGEMENT:  Following delivery, the patient was transported to  nursing in stable condition and monitored for possible acute effects.  Vital signs were recorded LMP 07/25/2012 Comment: pregnancy waiver form signed 01-18-2018. The patient tolerated treatment without significant acute effects, and was discharged to home in stable condition.    PLAN:  Complete treatment and then follow-up in one month.  ________________________________  Artist Pais. Kathrynn Running, M.D.

## 2023-02-13 ENCOUNTER — Other Ambulatory Visit: Payer: Self-pay

## 2023-02-13 ENCOUNTER — Ambulatory Visit
Admission: RE | Admit: 2023-02-13 | Discharge: 2023-02-13 | Disposition: A | Discharge status: Home / Self Care | Payer: Commercial Managed Care - HMO | Source: Ambulatory Visit | Attending: Radiation Oncology | Admitting: Radiation Oncology

## 2023-02-13 ENCOUNTER — Encounter: Payer: Self-pay | Admitting: Hematology and Oncology

## 2023-02-13 DIAGNOSIS — Z510 Encounter for antineoplastic radiation therapy: Secondary | ICD-10-CM | POA: Diagnosis not present

## 2023-02-13 DIAGNOSIS — C7931 Secondary malignant neoplasm of brain: Secondary | ICD-10-CM

## 2023-02-13 LAB — RAD ONC ARIA SESSION SUMMARY
Course Elapsed Days: 3
Plan Fractions Treated to Date: 2
Plan Prescribed Dose Per Fraction: 6 Gy
Plan Total Fractions Prescribed: 5
Plan Total Prescribed Dose: 30 Gy
Reference Point Dosage Given to Date: 12 Gy
Reference Point Session Dosage Given: 6 Gy
Session Number: 2

## 2023-02-13 NOTE — Progress Notes (Signed)
Patient to nursing for 15 minute observation SRS Brain. Alert and verbally responsive. Reports mild headache is taking Tylenol to help ease discomfort. Ringing in the right ear occasionally. Denies fatigue, visual changes, nausea and skin irritation. Occasional imbalance when walking and some short term memory loss.  Continue on Decadron 2 mg po daily.  Advised patient not to do anything strenuous for the next 24 hours.  Vitals: 97.1-63-18-140/76 O2 sat 100% on room air.

## 2023-02-14 ENCOUNTER — Encounter: Payer: Self-pay | Admitting: Hematology and Oncology

## 2023-02-15 ENCOUNTER — Other Ambulatory Visit: Payer: Self-pay | Admitting: Radiation Therapy

## 2023-02-15 ENCOUNTER — Other Ambulatory Visit: Payer: Self-pay

## 2023-02-15 ENCOUNTER — Other Ambulatory Visit: Payer: Self-pay | Admitting: Hematology and Oncology

## 2023-02-15 ENCOUNTER — Ambulatory Visit
Admission: RE | Admit: 2023-02-15 | Discharge: 2023-02-15 | Discharge status: Home / Self Care | Payer: Commercial Managed Care - HMO | Source: Ambulatory Visit | Attending: Radiation Oncology | Admitting: Radiation Oncology

## 2023-02-15 VITALS — BP 103/83 | HR 73 | Temp 97.8°F | Resp 18 | Ht 62.0 in

## 2023-02-15 DIAGNOSIS — C7931 Secondary malignant neoplasm of brain: Secondary | ICD-10-CM

## 2023-02-15 DIAGNOSIS — Z510 Encounter for antineoplastic radiation therapy: Secondary | ICD-10-CM | POA: Diagnosis not present

## 2023-02-15 LAB — RAD ONC ARIA SESSION SUMMARY
Course Elapsed Days: 5
Plan Fractions Treated to Date: 3
Plan Prescribed Dose Per Fraction: 6 Gy
Plan Total Fractions Prescribed: 5
Plan Total Prescribed Dose: 30 Gy
Reference Point Dosage Given to Date: 18 Gy
Reference Point Session Dosage Given: 6 Gy
Session Number: 3

## 2023-02-15 NOTE — Progress Notes (Signed)
Avastin was recommended by neuro-oncology for uncontrolled brain metastases.

## 2023-02-15 NOTE — Progress Notes (Signed)
Patient to nursing for 15 minute observation SRS Brain. Alert and verbally responsive. Reports mild headache is taking Tylenol to help ease discomfort. Ringing in the right ear occasionally. Denies fatigue, visual changes, nausea and skin irritation. Occasional imbalance when walking and some short term memory loss.  Continue on Decadron 2 mg po daily.  Vitals:  97.8-73-18-103/83 O2 sat 100% room air.  Advised patient not to do anything strenuous for 24 hours and to call if any changes [phone].

## 2023-02-17 ENCOUNTER — Other Ambulatory Visit: Payer: Self-pay

## 2023-02-17 ENCOUNTER — Encounter: Payer: Self-pay | Admitting: Hematology and Oncology

## 2023-02-17 ENCOUNTER — Ambulatory Visit
Admission: RE | Admit: 2023-02-17 | Discharge: 2023-02-17 | Disposition: A | Discharge status: Home / Self Care | Payer: Commercial Managed Care - HMO | Source: Ambulatory Visit | Attending: Radiation Oncology | Admitting: Radiation Oncology

## 2023-02-17 DIAGNOSIS — Z510 Encounter for antineoplastic radiation therapy: Secondary | ICD-10-CM | POA: Diagnosis not present

## 2023-02-17 DIAGNOSIS — C7931 Secondary malignant neoplasm of brain: Secondary | ICD-10-CM

## 2023-02-17 LAB — RAD ONC ARIA SESSION SUMMARY
Course Elapsed Days: 7
Plan Fractions Treated to Date: 4
Plan Prescribed Dose Per Fraction: 6 Gy
Plan Total Fractions Prescribed: 5
Plan Total Prescribed Dose: 30 Gy
Reference Point Dosage Given to Date: 24 Gy
Reference Point Session Dosage Given: 6 Gy
Session Number: 4

## 2023-02-17 NOTE — Addendum Note (Signed)
Encounter addended by: Bethann Goo, DO on: 02/17/2023 11:10 AM  Actions taken: Clinical Note Signed

## 2023-02-17 NOTE — Op Note (Signed)
Name: CEDELLA PASCARELLA    MRN: [id number]   Date: 02/10/2023    DOB: 1966/01/28   STEREOTACTIC RADIOSURGERY OPERATIVE NOTE  PRE-OPERATIVE DIAGNOSIS:  Metastatic brain tumor, R temporal, and pontine  POST-OPERATIVE DIAGNOSIS:  Same  PROCEDURE:  Stereotactic Radiosurgery  SURGEON:  Monia Pouch, DO  RADIATION ONCOLOGIST: Dr. Kathrynn Running  TECHNIQUE:  The patient underwent a radiation treatment planning session in the radiation oncology simulation suite under the care of the radiation oncology physician and physicist.  I participated closely in the radiation treatment planning afterwards. The patient underwent planning CT which was fused to 3T high resolution MRI with 1 mm axial slices.  These images were fused on the planning system.  We contoured the gross target volumes and subsequently expanded this to yield the Planning Target Volume. I actively participated in the planning process.  I helped to define and review the target contours and also the contours of the optic pathway, eyes, brainstem and selected nearby organs at risk.  All the dose constraints for critical structures were reviewed and compared to AAPM Task Group 101.  The prescription dose conformity was reviewed.  I approved the plan electronically.    Accordingly, [name]  was brought to the TrueBeam stereotactic radiation treatment linac and placed in the custom immobilization mask.  The patient was aligned according to the IR fiducial markers with BrainLab Exactrac, then orthogonal x-rays were used in ExacTrac with the 6DOF robotic table and the shifts were made to align the patient.  Ivory Broad Vandevelde received stereotactic radiosurgery to a prescription dose of 6 and 5Gy uneventfully (respectively, in planned 5 fractions for total doses of 30 and 25Gy).    The detailed description of the procedure is recorded in the radiation oncology procedure note.  I was present for the duration of the procedure.  DISPOSITION:   Following delivery,  the patient was transported to nursing in stable condition and monitored for possible acute effects to be discharged to home in stable condition with follow-up in one month.  Monia Pouch, DO [facility]

## 2023-02-17 NOTE — Addendum Note (Signed)
Encounter addended by: Roel Cluck, RN on: 02/17/2023 11:21 AM  Actions taken: Clinical Note Signed

## 2023-02-17 NOTE — Progress Notes (Signed)
Patient to nursing for 15 minute observation SRS Brain. Alert and verbally responsive. Reports mild headache is taking Tylenol to help ease discomfort. Ringing in the right ear occasionally. Denies fatigue, visual changes, nausea and skin irritation. Occasional imbalance when walking and some short term memory loss.  Continue on Decadron 2 mg po daily.  Vitals:  97.2-71-18-123/91 O2 sat 100% room air.  Advised patient not to do anything strenuous for 24 hours and to call if any changes [phone].

## 2023-02-18 ENCOUNTER — Other Ambulatory Visit: Payer: Self-pay

## 2023-02-20 ENCOUNTER — Other Ambulatory Visit: Payer: Self-pay | Admitting: Hematology and Oncology

## 2023-02-20 ENCOUNTER — Ambulatory Visit
Admission: RE | Admit: 2023-02-20 | Discharge: 2023-02-20 | Disposition: A | Discharge status: Home / Self Care | Payer: Commercial Managed Care - HMO | Source: Ambulatory Visit | Attending: Radiation Oncology | Admitting: Radiation Oncology

## 2023-02-20 ENCOUNTER — Other Ambulatory Visit: Payer: Self-pay

## 2023-02-20 ENCOUNTER — Telehealth: Payer: Self-pay | Admitting: Hematology and Oncology

## 2023-02-20 DIAGNOSIS — Z510 Encounter for antineoplastic radiation therapy: Secondary | ICD-10-CM | POA: Diagnosis not present

## 2023-02-20 DIAGNOSIS — C7931 Secondary malignant neoplasm of brain: Secondary | ICD-10-CM

## 2023-02-20 LAB — RAD ONC ARIA SESSION SUMMARY
Course Elapsed Days: 10
Plan Fractions Treated to Date: 5
Plan Prescribed Dose Per Fraction: 6 Gy
Plan Total Fractions Prescribed: 5
Plan Total Prescribed Dose: 30 Gy
Reference Point Dosage Given to Date: 30 Gy
Reference Point Session Dosage Given: 6 Gy
Session Number: 5

## 2023-02-20 NOTE — Telephone Encounter (Signed)
Scheduled appointments per WQ. Left voicemail with appointment information.

## 2023-02-20 NOTE — Progress Notes (Addendum)
Patient to nursing for 15 minute observation SRS Brain. Complete 5/5 SRS Brain.  Alert and verbally responsive. Reports mild headache is taking Tylenol to help ease discomfort. Ringing in the right ear occasionally. Denies fatigue, visual changes, nausea and skin irritation. Occasional imbalance when walking and some short term memory loss.  Continue on Decadron 2 mg po daily.  No taper instructions needed is being followed by Dr. Barbaraann Cao.  Vitals:  97.3- 81-18-127/66 O2 sat 100% room air.  Advised patient not to do anything strenuous for 24 hours and to call if any changes [phone].

## 2023-02-21 ENCOUNTER — Other Ambulatory Visit: Payer: Self-pay

## 2023-02-21 ENCOUNTER — Inpatient Hospital Stay: Payer: Commercial Managed Care - HMO | Admitting: Hematology and Oncology

## 2023-02-21 ENCOUNTER — Inpatient Hospital Stay: Payer: Commercial Managed Care - HMO | Attending: Medical

## 2023-02-21 ENCOUNTER — Inpatient Hospital Stay: Payer: Commercial Managed Care - HMO

## 2023-02-21 VITALS — BP 125/70 | HR 73 | Temp 97.9°F | Resp 16 | Wt 118.5 lb

## 2023-02-21 VITALS — BP 128/63 | HR 84 | Resp 14

## 2023-02-21 DIAGNOSIS — C50912 Malignant neoplasm of unspecified site of left female breast: Secondary | ICD-10-CM

## 2023-02-21 DIAGNOSIS — Z5112 Encounter for antineoplastic immunotherapy: Secondary | ICD-10-CM | POA: Diagnosis present

## 2023-02-21 DIAGNOSIS — Z171 Estrogen receptor negative status [ER-]: Secondary | ICD-10-CM | POA: Diagnosis not present

## 2023-02-21 DIAGNOSIS — Z170 Estrogen receptor positive status [ER+]: Secondary | ICD-10-CM | POA: Insufficient documentation

## 2023-02-21 DIAGNOSIS — C50212 Malignant neoplasm of upper-inner quadrant of left female breast: Secondary | ICD-10-CM | POA: Insufficient documentation

## 2023-02-21 DIAGNOSIS — C787 Secondary malignant neoplasm of liver and intrahepatic bile duct: Secondary | ICD-10-CM | POA: Insufficient documentation

## 2023-02-21 DIAGNOSIS — C7931 Secondary malignant neoplasm of brain: Secondary | ICD-10-CM | POA: Insufficient documentation

## 2023-02-21 DIAGNOSIS — C7951 Secondary malignant neoplasm of bone: Secondary | ICD-10-CM | POA: Diagnosis not present

## 2023-02-21 LAB — CBC WITH DIFFERENTIAL (CANCER CENTER ONLY)
Abs Immature Granulocytes: 0.07 10*3/uL (ref 0.00–0.07)
Basophils Absolute: 0 10*3/uL (ref 0.0–0.1)
Basophils Relative: 0 %
Eosinophils Absolute: 0.1 10*3/uL (ref 0.0–0.5)
Eosinophils Relative: 1 %
HCT: 29.2 % — ABNORMAL LOW (ref 36.0–46.0)
Hemoglobin: 9.3 g/dL — ABNORMAL LOW (ref 12.0–15.0)
Immature Granulocytes: 1 %
Lymphocytes Relative: 14 %
Lymphs Abs: 1.8 10*3/uL (ref 0.7–4.0)
MCH: 28.4 pg (ref 26.0–34.0)
MCHC: 31.8 g/dL (ref 30.0–36.0)
MCV: 89 fL (ref 80.0–100.0)
Monocytes Absolute: 1.1 10*3/uL — ABNORMAL HIGH (ref 0.1–1.0)
Monocytes Relative: 8 %
Neutro Abs: 9.9 10*3/uL — ABNORMAL HIGH (ref 1.7–7.7)
Neutrophils Relative %: 76 %
Platelet Count: 371 10*3/uL (ref 150–400)
RBC: 3.28 MIL/uL — ABNORMAL LOW (ref 3.87–5.11)
RDW: 19.3 % — ABNORMAL HIGH (ref 11.5–15.5)
WBC Count: 12.9 10*3/uL — ABNORMAL HIGH (ref 4.0–10.5)
nRBC: 0 % (ref 0.0–0.2)

## 2023-02-21 LAB — TOTAL PROTEIN, URINE DIPSTICK: Protein, ur: NEGATIVE mg/dL

## 2023-02-21 MED ORDER — ACETAMINOPHEN 325 MG PO TABS
650.0000 mg | ORAL_TABLET | Freq: Once | ORAL | Status: AC
  Administered 2023-02-21: 650 mg via ORAL
  Filled 2023-02-21: qty 2

## 2023-02-21 MED ORDER — SODIUM CHLORIDE 0.9 % IV SOLN
10.0000 mg/kg | Freq: Once | INTRAVENOUS | Status: AC
  Administered 2023-02-21: 500 mg via INTRAVENOUS
  Filled 2023-02-21: qty 4

## 2023-02-21 MED ORDER — DIPHENHYDRAMINE HCL 25 MG PO CAPS
25.0000 mg | ORAL_CAPSULE | Freq: Once | ORAL | Status: AC
  Administered 2023-02-21: 25 mg via ORAL
  Filled 2023-02-21: qty 1

## 2023-02-21 MED ORDER — TRASTUZUMAB-ANNS CHEMO 150 MG IV SOLR
6.0000 mg/kg | Freq: Once | INTRAVENOUS | Status: AC
  Administered 2023-02-21: 300 mg via INTRAVENOUS
  Filled 2023-02-21: qty 14.29

## 2023-02-21 MED ORDER — SODIUM CHLORIDE 0.9 % IV SOLN: Freq: Once | INTRAVENOUS | Status: AC

## 2023-02-21 MED ORDER — SODIUM CHLORIDE 0.9% FLUSH
10.0000 mL | INTRAVENOUS | Status: DC | PRN
  Administered 2023-02-21: 10 mL

## 2023-02-21 MED ORDER — HEPARIN SOD (PORK) LOCK FLUSH 100 UNIT/ML IV SOLN
500.0000 [IU] | Freq: Once | INTRAVENOUS | Status: AC | PRN
  Administered 2023-02-21: 500 [IU]

## 2023-02-21 NOTE — Radiation Completion Notes (Addendum)
  Radiation Oncology         [phone] ________________________________  Name: Melissa [name] MRN: [id number]  Date: 02/20/2023  DOB: 1966-04-18  Referring Physician: Cherrie Distance, M.D. Date of Service: 2023-02-21 Radiation Oncologist: Margaretmary Bayley, M.D. [facility]     RADIATION ONCOLOGY END OF TREATMENT NOTE     Diagnosis:  57 yo woman s/p resection of a 3.2 cm right temporal lobe metastasis, with a history of ER+ HER2+ cancer in the upper inner quadrant of the left breast   Intent: Palliative   ==========DELIVERED PLANS==========  First Treatment Date: 2023-02-10 - Last Treatment Date: 2023-02-20   Plan Name: Brain_SRT (post-op) Site: Brain Technique: SBRT/SRT-IMRT Mode: Photon Dose Per Fraction: 6 Gy Prescribed Dose (Delivered / Prescribed): 30 Gy / 30 Gy Prescribed Fxs (Delivered / Prescribed): 5 / 5     ==========ON TREATMENT VISIT DATES========== 2023-02-10, 2023-02-13, 2023-02-15, 2023-02-17, 2023-02-20   See weekly On Treatment Notes in Epic for details. The patient tolerated treatment without significant acute effects, and was discharged to home in stable condition.   The patient will receive a call in about one month from the radiation oncology department. She will continue follow up with her medical oncologist, Dr. Pamelia Hoit and neuro-oncologist, Dr. Barbaraann Cao, as well.  ------------------------------------------------   Margaretmary Dys, MD [facility]  Radiation Oncology Direct Dial: [phone]  Fax: [phone] [email or web address]  Skype  [email or web address]

## 2023-02-21 NOTE — Assessment & Plan Note (Signed)
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  SRS brain: 08/25/2017-09/04/2017 2. Anti Her 2 therapy with Lapatinib started 09/17/2017-05/14/2019: Stopped for progression 3.  I discontinued tamoxifen and started her on letrozole 2.5 mg daily.  05/14/2019 stopped for progression 4.  Stereotactic radiosurgery 12/19/2017 to the new right parietal lobe metastases. 5.  Kadcyla: Received 16 cycles discontinued 05/05/2020 6.  Tucatinib Xeloda: Discontinued because of hand-foot syndrome 06/11/2020-12/15/2020 7. Herceptin/Neratinib: unable to tolerate due to diarrhea and weight loss 8.  SRS to new brain mets 10/01/2021 9.  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 Continue with Herceptin maintenance CT CAP  01/26/2023: Unchanged hypodense lesion of hepatic segment 5, no evidence of new metastatic disease in chest abdomen or pelvis bronchiectasis, emphysema, right lower lobe groundglass opacity  Brain MRI 02/01/2023: Slight further enlargement of the enhancing mass in the central pons 1.3 cm, less vasogenic edema throughout right cerebral hemisphere improved left occipital lobe, posttreatment changes right frontal lobe  Brain tumor board recommendation: To start Avastin 10 mg/kg every 3 weeks.  This will be added to Herceptin

## 2023-02-21 NOTE — Patient Instructions (Signed)
[place] [facility]  Discharge Instructions: Thank you for choosing [facility] to provide your oncology and hematology care.   If you have a lab appointment with the Cancer Center, please go directly to the Cancer Center and check in at the registration area.   Wear comfortable clothing and clothing appropriate for easy access to any Portacath or PICC line.   We strive to give you quality time with your provider. You may need to reschedule your appointment if you arrive late (15 or more minutes).  Arriving late affects you and other patients whose appointments are after yours.  Also, if you miss three or more appointments without notifying the office, you may be dismissed from the clinic at the provider's discretion.      For prescription refill requests, have your pharmacy contact our office and allow 72 hours for refills to be completed.    Today you received the following chemotherapy and/or immunotherapy agents Kanjinti, MVASI.      To help prevent nausea and vomiting after your treatment, we encourage you to take your nausea medication as directed.  BELOW ARE SYMPTOMS THAT SHOULD BE REPORTED IMMEDIATELY: *FEVER GREATER THAN 100.4 F (38 C) OR HIGHER *CHILLS OR SWEATING *NAUSEA AND VOMITING THAT IS NOT CONTROLLED WITH YOUR NAUSEA MEDICATION *UNUSUAL SHORTNESS OF BREATH *UNUSUAL BRUISING OR BLEEDING *URINARY PROBLEMS (pain or burning when urinating, or frequent urination) *BOWEL PROBLEMS (unusual diarrhea, constipation, pain near the anus) TENDERNESS IN MOUTH AND THROAT WITH OR WITHOUT PRESENCE OF ULCERS (sore throat, sores in mouth, or a toothache) UNUSUAL RASH, SWELLING OR PAIN  UNUSUAL VAGINAL DISCHARGE OR ITCHING   Items with * indicate a potential emergency and should be followed up as soon as possible or go to the Emergency Department if any problems should occur.  Please show the CHEMOTHERAPY ALERT CARD or IMMUNOTHERAPY ALERT CARD at  check-in to the Emergency Department and triage nurse.  Should you have questions after your visit or need to cancel or reschedule your appointment, please contact [place] [facility]  Dept: [phone]  and follow the prompts.  Office hours are 8:00 a.m. to 4:30 p.m. Monday - Friday. Please note that voicemails left after 4:00 p.m. may not be returned until the following business day.  We are closed weekends and major holidays. You have access to a nurse at all times for urgent questions. Please call the main number to the clinic Dept: [phone] and follow the prompts.   For any non-urgent questions, you may also contact your provider using MyChart. We now offer e-Visits for anyone 1 and older to request care online for non-urgent symptoms. For details visit [email or web address].   Also download the MyChart app! Go to the app store, search "MyChart", open the app, select [facility], and log in with your MyChart username and password.

## 2023-02-21 NOTE — Progress Notes (Signed)
Patient Care Team: Thana Ates, MD as PCP - General (Internal Medicine) Domenick Gong, MD as Referring Physician (Emergency Medicine)  DIAGNOSIS:  Encounter Diagnoses  Name Primary?   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 Ki-67 was 70% HER-2 was amplified with a ratio 2.91   07/12/2012 - 07/17/2013 Neo-Adjuvant Chemotherapy   TCH 6 followed by Herceptin maintenance   12/11/2012 Surgery   Left breast lumpectomy: 1.8 cm tumor 1 positive sentinel node, axillary lymph node dissection 02/08/2013 showed 0/13 lymph nodes   03/25/2013 - 05/06/2013 Radiation Therapy   Adjuvant radiation therapy   06/05/2013 - 07/20/2017 Anti-estrogen oral therapy   Tamoxifen 20 mg daily   07/27/2017 Relapse/Recurrence   MRI Brain: 3.4 x 2.9 x 2.9 cm RIGHT frontal lobe mass with imaging characteristics of solitary metastasis. Extensive vasogenic edema resulting in 9 mm RIGHT to LEFT midline shift. Equivocal very early LEFT ventricle entrapment.    08/04/2017 Surgery   Rt frontal brain resection: Poorly differentiated tumor IHC suggests breast primary ER and PR Positive   08/25/2017 - 09/04/2017 Radiation Therapy   Stereotactic radiation   09/18/2017 -  Anti-estrogen oral therapy   Lapatinib with letrozole   12/18/2017 - 12/19/2017 Radiation Therapy   New right parietal lobe metastases status post [facility]   08/27/2018 - 08/27/2020 Radiation Therapy   SRS to new brain metastases   05/09/2019 Relapse/Recurrence   Interval increase in size of the enhancing nodular left  internal mammary soft tissue 2.8 cm.  Redemonstrated enlarged supraclavicular, lower cervical and lower posterior cervical nodes unchanged.  Interval increase in the bony erosion of the posterior and lateral left third rib, increasing soft tissue lesion eroding the left sternal body 3.1 cm was 2.5 cm.  Bronchiectatic changes   06/19/2019 - 05/05/2020 Chemotherapy   ado-trastuzumab emtansine (KADCYLA)     06/15/2020 -  Chemotherapy   Xeloda, Tucatinib, Herceptin    04/12/2022 - 09/06/2022 Chemotherapy   Patient is on Treatment Plan : BREAST Trastuzumab IV (8/6) or SQ (600) D1 q21d     09/27/2022 -  Chemotherapy   Patient is on Treatment Plan : BREAST MAINTENANCE Trastuzumab IV (6) or SQ (600) D1 q21d x 13 cycles. Added bevacizumab (10) q21d starting cycle 8.     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 Added bevacizumab (10) q21d starting cycle 8.     Port-A-Cath in place    CHIEF COMPLIANT:  metastatic breast cancer/ Herceptin, starting Avastin as well  INTERVAL HISTORY: Melissa Broad  [name] is a 57 y.o. with the above-mentioned history of metastatic breast cancer currently on treatment with Herceptin. She presents to the clinic today for follow-up to start Avastin. Patient reports that she is losing hair from the steroids that she is on. She denies any fatigue. She says she is not sleeping due to the steroids.   ALLERGIES:  is allergic to  aspirin, protonix [pantoprazole], doxycycline, promethazine-codeine, sulfamethoxazole-trimethoprim, and iodinated contrast media.  MEDICATIONS:  Current Outpatient Medications  Medication Sig Dispense Refill   acetaminophen (TYLENOL) 325 MG tablet Take 2 tablets (650 mg total) by mouth every 6 (six) hours as needed for mild pain (or Fever >/= 101). 12 tablet 0   albuterol (VENTOLIN HFA) 108 (90 Base) MCG/ACT inhaler Inhale 2 puffs into the lungs every 6 (six) hours as needed for wheezing or shortness of breath (cough). 18 g 5   calcium carbonate (TUMS EX) 750 MG chewable tablet Chew by mouth.     Cholecalciferol 250 MCG (10000 UT) CAPS Take by mouth.     dexamethasone (DECADRON) 2 MG tablet Take 1 tablet (2 mg total) by mouth daily. 60 tablet 1   Fluticasone-Salmeterol,sensor, (AIRDUO DIGIHALER) 113-14 MCG/ACT AEPB Inhale 1 puff into the lungs 2 (two) times daily. 1 each 4   GARLIC PO Take by mouth.     levalbuterol (XOPENEX) 0.63 MG/3ML nebulizer solution Take 3 mLs (0.63 mg total) by nebulization every 4 (four) hours as needed for wheezing or shortness of breath. 90 mL 12   lidocaine-prilocaine (EMLA) cream APPLY EXTERNALLY TO THE AFFECTED AREA 1 TIME 30 g 3   Multiple Vitamins-Minerals (EMERGEN-C VITAMIN C PO) Take by mouth.     naproxen sodium (ALEVE) 220 MG tablet Take by mouth.     OIL OF OREGANO PO Take 500 mg by mouth 2 (two) times daily.     ondansetron (ZOFRAN-ODT) 4 MG disintegrating tablet Take 4 mg by mouth every 8 (eight) hours as needed.     SYMBICORT 160-4.5 MCG/ACT inhaler Inhale 2 puffs into the lungs 2 (two) times daily.     Vitamin E (VITAMIN E/D-ALPHA NATURAL) 268 MG (400 UNIT) CAPS Take by mouth.     voriconazole (VFEND) 200 MG tablet Take 1 tablet (200 mg total) by mouth 2 (two) times daily. 60 tablet 11   No current facility-administered medications for this visit.   Facility-Administered Medications Ordered in Other Visits  Medication Dose Route Frequency Provider  Last Rate Last Admin   alteplase (CATHFLO ACTIVASE) injection 2 mg  2 mg Intracatheter Once PRN Serena Croissant, MD       sodium chloride flush (NS) 0.9 % injection 10 mL  10 mL Intracatheter PRN Serena Croissant, MD   10 mL at 07/06/21 1428    PHYSICAL EXAMINATION: ECOG PERFORMANCE STATUS: 1 - Symptomatic but completely ambulatory  Vitals:   02/21/23 0817  BP: 125/70  Pulse: 73  Resp: 16  Temp: 97.9 F (36.6 C)  SpO2: 100%   Filed Weights   02/21/23 0817  Weight: 118 lb 8 oz (53.8 kg)      LABORATORY DATA:  I have reviewed the data as listed    Latest Ref Rng & Units 11/29/2022    9:39 AM 05/24/2022   10:31 AM 04/12/2022    8:20 AM  CMP  Glucose 70 - 99 mg/dL 92  56  96   BUN 6 - 20 mg/dL 19  11  13    Creatinine 0.44 - 1.00 mg/dL 2.53  6.64  4.03  Sodium 135 - 145 mmol/L 140  139  138   Potassium 3.5 - 5.1 mmol/L 3.6  4.5  4.1   Chloride 98 - 111 mmol/L 106  104  106   CO2 22 - 32 mmol/L 28  31  29    Calcium 8.9 - 10.3 mg/dL 9.1  82.9  9.4   Total Protein 6.5 - 8.1 g/dL 6.5  8.2  7.2   Total Bilirubin 0.3 - 1.2 mg/dL 0.2  0.2  0.2   Alkaline Phos 38 - 126 U/L 62  74  71   AST 15 - 41 U/L 13  14  16    ALT 0 - 44 U/L 14  8  13      Lab Results  Component Value Date   WBC 12.9 (H) 02/21/2023   HGB 9.3 (L) 02/21/2023   HCT 29.2 (L) 02/21/2023   MCV 89.0 02/21/2023   PLT 371 02/21/2023   NEUTROABS 9.9 (H) 02/21/2023    ASSESSMENT & PLAN:  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 ---s/p Lumpectomy/ /Initial Ca 1997, Brain Mets 2019 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  SRS brain: 08/25/2017-09/04/2017 2. Anti Her 2 therapy with Lapatinib started 09/17/2017-05/14/2019: Stopped for progression 3.  I discontinued tamoxifen and started her on letrozole 2.5 mg daily.  05/14/2019 stopped for progression 4.  Stereotactic radiosurgery 12/19/2017 to the new right parietal lobe metastases. 5.  Kadcyla: Received 16 cycles discontinued 05/05/2020 6.  Tucatinib Xeloda: Discontinued because of hand-foot syndrome 06/11/2020-12/15/2020 7. Herceptin/Neratinib: unable to tolerate due to diarrhea and weight loss 8.  SRS to new brain mets 10/01/2021 9.  Resection of brain metastases at [facility] 12/29/2022: Metastatic breast cancer proc panel pending 10.  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 Continue with Herceptin maintenance CT CAP 01/26/2023: Unchanged hypodense lesion of hepatic segment 5, no evidence of new metastatic disease in chest abdomen or pelvis bronchiectasis, emphysema, right lower lobe groundglass opacity  Brain MRI 02/01/2023: Slight further enlargement of the enhancing mass in the central pons 1.3 cm, less vasogenic edema throughout right cerebral hemisphere improved left occipital lobe,  posttreatment changes right frontal lobe  Brain tumor board recommendation: To start Avastin 10 mg/kg every 3 weeks.  This will be added to Herceptin Return to clinic every 3 weeks for treatments.  No orders of the defined types were placed in this encounter.  The patient has a good understanding of the overall plan. she agrees with it. she will call with any problems that may develop before the next visit here. Total time spent: 30  mins including face to face time and time spent for planning, charting and co-ordination of care   Tamsen Meek, MD 02/21/23   I Janan Ridge am acting as a Neurosurgeon for [employer]  I have reviewed the above documentation for accuracy and completeness, and I agree with the above.

## 2023-02-22 ENCOUNTER — Ambulatory Visit: Payer: Commercial Managed Care - HMO | Admitting: Radiation Oncology

## 2023-02-24 ENCOUNTER — Ambulatory Visit: Payer: Commercial Managed Care - HMO | Admitting: Radiation Oncology

## 2023-02-27 ENCOUNTER — Other Ambulatory Visit (HOSPITAL_COMMUNITY): Payer: Self-pay

## 2023-02-27 ENCOUNTER — Encounter: Payer: Self-pay | Admitting: Hematology and Oncology

## 2023-03-06 ENCOUNTER — Telehealth: Payer: Self-pay

## 2023-03-06 ENCOUNTER — Other Ambulatory Visit (HOSPITAL_COMMUNITY): Payer: Self-pay

## 2023-03-06 ENCOUNTER — Encounter: Payer: Self-pay | Admitting: Hematology and Oncology

## 2023-03-06 NOTE — Telephone Encounter (Addendum)
[facility] Patient Advocate Encounter  Prior Authorization for Voriconazole has been approved.    PA# [id number] Effective dates: 03/06/23 through 09/02/23  Patients co-pay is $437.49.   No patient assistance available and copay card assistance available.  [facility] will continue to follow.  Clearance Coots, CPhT Specialty Pharmacy Patient [facility] for Infectious Disease Phone: [phone] Fax:  [phone]

## 2023-03-06 NOTE — Telephone Encounter (Signed)
[facility] Patient Advocate Encounter   Received notification from [facility] that prior authorization for Voriconazole is required.   PA submitted on 03/06/23 Key [id number] Status is pending    [facility] will continue to follow.   Clearance Coots, CPhT Specialty Pharmacy Patient [facility] for Infectious Disease Phone: [phone] Fax:  [phone]

## 2023-03-06 NOTE — Telephone Encounter (Signed)
Patient called stating that she is having issue with getting voriconazole since change in insurance. Also wanted to know if there was any assistance with getting Symbicort. Informed patient Dr.Van Dam didn't prescribe Symbicort that her pulmonologist last prescribed inhalers. Patient stated that in the past someone from our office was able to get her assistance with her inhalers.   Maelee Hoot Lesli Albee, CMA

## 2023-03-07 ENCOUNTER — Other Ambulatory Visit (HOSPITAL_COMMUNITY): Payer: Self-pay

## 2023-03-07 ENCOUNTER — Encounter: Payer: Self-pay | Admitting: Hematology and Oncology

## 2023-03-07 NOTE — Telephone Encounter (Signed)
Dr. Daiva Eves - she has new insurance and was previously uninsured so we were getting it through patient assistance. She states she cannot afford copay and no copay assistance is available.

## 2023-03-08 ENCOUNTER — Telehealth: Payer: Self-pay

## 2023-03-08 ENCOUNTER — Encounter: Payer: Self-pay | Admitting: Hematology and Oncology

## 2023-03-08 ENCOUNTER — Other Ambulatory Visit (HOSPITAL_COMMUNITY): Payer: Self-pay

## 2023-03-08 NOTE — Telephone Encounter (Signed)
[facility] Patient Advocate Encounter  Prior Authorization for Shelle Iron has been approved.    PA# [id number] Effective dates: 03/08/23 through 06/06/23  Patients co-pay is $1675.00.   Insurance will only pay a max of $5000.00 Prescription will need to be written for #42 for 21 days.  [facility] will continue to follow.  Clearance Coots, CPhT Specialty Pharmacy Patient [facility] for Infectious Disease Phone: [phone] Fax:  [phone]

## 2023-03-08 NOTE — Telephone Encounter (Signed)
She has been on it forever. We've used Cresemba before but it needs a PA and will probably be expensive too. It may have a copay card though since it is brand only. Want [name] to try?

## 2023-03-08 NOTE — Telephone Encounter (Signed)
[facility] Patient Advocate Encounter   Was successful in obtaining a [place] copay card for Cresemba.  This copay card will make the patients copay $25.00.  I have spoken with the patient.             Clearance Coots, CPhT Specialty Pharmacy Patient [facility] for Infectious Disease Phone: [phone] Fax:  [phone]

## 2023-03-08 NOTE — Telephone Encounter (Signed)
Yes she did but the copay is $437 per month with no assistance available and she cannot afford.

## 2023-03-14 ENCOUNTER — Telehealth: Payer: Self-pay

## 2023-03-14 NOTE — Progress Notes (Signed)
Patient Care Team: Thana Ates, MD as PCP - General (Internal Medicine) Domenick Gong, MD as Referring Physician (Emergency Medicine)  DIAGNOSIS:  Encounter Diagnoses  Name Primary?   Malignant neoplasm of upper-inner quadrant of left breast in female, estrogen receptor negative (HCC) Yes   Cardiotoxicity (HCC)     SUMMARY OF ONCOLOGIC HISTORY: Oncology History  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 Ki-67 was 70% HER-2 was amplified with a ratio 2.91   07/12/2012 - 07/17/2013 Neo-Adjuvant Chemotherapy   TCH 6 followed by Herceptin maintenance   12/11/2012 Surgery   Left breast lumpectomy: 1.8 cm tumor 1 positive sentinel node, axillary lymph node dissection 02/08/2013 showed 0/13 lymph nodes   03/25/2013 - 05/06/2013 Radiation Therapy   Adjuvant radiation therapy   06/05/2013 - 07/20/2017 Anti-estrogen oral therapy   Tamoxifen 20 mg daily   07/27/2017 Relapse/Recurrence   MRI Brain: 3.4 x 2.9 x 2.9 cm RIGHT frontal lobe mass with imaging characteristics of solitary metastasis. Extensive vasogenic edema resulting in 9 mm RIGHT to LEFT midline shift. Equivocal very early LEFT ventricle entrapment.    08/04/2017 Surgery   Rt frontal brain resection: Poorly differentiated tumor IHC suggests breast primary ER and PR Positive   08/25/2017 - 09/04/2017 Radiation Therapy   Stereotactic radiation   09/18/2017 -  Anti-estrogen oral therapy   Lapatinib with letrozole   12/18/2017 - 12/19/2017 Radiation Therapy   New right parietal lobe metastases status post [facility]   08/27/2018 - 08/27/2020 Radiation Therapy   SRS to new brain metastases   05/09/2019 Relapse/Recurrence   Interval increase in size of the enhancing nodular left internal mammary soft tissue  2.8 cm.  Redemonstrated enlarged supraclavicular, lower cervical and lower posterior cervical nodes unchanged.  Interval increase in the bony erosion of the posterior and lateral left third rib, increasing soft tissue lesion eroding the left sternal body 3.1 cm was 2.5 cm.  Bronchiectatic changes   06/19/2019 - 05/05/2020 Chemotherapy   ado-trastuzumab emtansine (KADCYLA)     06/15/2020 -  Chemotherapy   Xeloda, Tucatinib, Herceptin    04/12/2022 - 09/06/2022 Chemotherapy   Patient is on Treatment Plan : BREAST Trastuzumab IV (8/6) or SQ (600) D1 q21d     09/27/2022 -  Chemotherapy   Patient is on Treatment Plan : BREAST MAINTENANCE Trastuzumab IV (6) or SQ (600) D1 q21d x 13 cycles. Added bevacizumab (10) q21d starting cycle 8.     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 Added bevacizumab (10) q21d starting cycle 8.     Port-A-Cath in place    CHIEF COMPLIANT: Herceptin  INTERVAL HISTORY: Melissa [name] is a 57 y.o. with the above-mentioned history of metastatic breast cancer currently  on treatment with Herceptin. She presents to the clinic today for follow-up for treatment. Patient reports that she is tolerating the herceptin and Avastin extremely well. She denies any side effects or symptoms. Does have some mild diarrhea but tolerable. Her only complaint is her left hand and fingers is very numb and tingles since she had the  surgery.   ALLERGIES:  is allergic to aspirin, protonix [pantoprazole], doxycycline, promethazine-codeine, sulfamethoxazole-trimethoprim, and iodinated contrast media.  MEDICATIONS:  Current Outpatient Medications  Medication Sig Dispense Refill   acetaminophen (TYLENOL) 325 MG tablet Take 2 tablets (650 mg total) by mouth every 6 (six) hours as needed for mild pain (or Fever >/= 101). 12 tablet 0   albuterol (VENTOLIN HFA) 108 (90 Base) MCG/ACT inhaler Inhale 2 puffs into the lungs every 6 (six) hours as needed for wheezing or shortness of breath (cough). 18 g 5   calcium carbonate (TUMS EX) 750 MG chewable tablet Chew by mouth.     chlorpheniramine-HYDROcodone (TUSSIONEX) 10-8 MG/5ML Take 5 mLs by mouth 2 (two) times daily. 115 mL 0   Cholecalciferol 250 MCG (10000 UT) CAPS Take by mouth.     dexamethasone (DECADRON) 2 MG tablet Take 1 tablet (2 mg total) by mouth daily. 60 tablet 1   Fluticasone-Salmeterol,sensor, (AIRDUO DIGIHALER) 113-14 MCG/ACT AEPB Inhale 1 puff into the lungs 2 (two) times daily. 1 each 4   GARLIC PO Take by mouth.     levalbuterol (XOPENEX) 0.63 MG/3ML nebulizer solution Take 3 mLs (0.63 mg total) by nebulization every 4 (four) hours as needed for wheezing or shortness of breath. 90 mL 12   lidocaine-prilocaine (EMLA) cream APPLY EXTERNALLY TO THE AFFECTED AREA 1 TIME 30 g 3   Multiple Vitamins-Minerals (EMERGEN-C VITAMIN C PO) Take by mouth.     naproxen sodium (ALEVE) 220 MG tablet Take by mouth.     OIL OF OREGANO PO Take 500 mg by mouth 2 (two) times daily.     ondansetron (ZOFRAN-ODT) 4 MG disintegrating tablet Take 4 mg by mouth every 8 (eight) hours as needed.     SYMBICORT 160-4.5 MCG/ACT inhaler Inhale 2 puffs into the lungs 2 (two) times daily.     Vitamin E (VITAMIN E/D-ALPHA NATURAL) 268 MG (400 UNIT) CAPS Take by mouth.     voriconazole (VFEND) 200 MG tablet Take 1 tablet (200 mg total) by mouth 2 (two) times daily. 60 tablet 11   No current  facility-administered medications for this visit.   Facility-Administered Medications Ordered in Other Visits  Medication Dose Route Frequency Provider Last Rate Last Admin   alteplase (CATHFLO ACTIVASE) injection 2 mg  2 mg Intracatheter Once PRN Serena Croissant, MD       sodium chloride flush (NS) 0.9 % injection 10 mL  10 mL Intracatheter PRN Serena Croissant, MD   10 mL at 07/06/21 1428    PHYSICAL EXAMINATION: ECOG PERFORMANCE STATUS: 1 - Symptomatic but completely ambulatory  Vitals:   03/15/23 0805  BP: 130/76  Pulse: 90  Resp: 18  Temp: 97.7 F (36.5 C)  SpO2: 100%   Filed Weights   03/15/23 0805  Weight: 118 lb 11.2 oz (53.8 kg)      LABORATORY DATA:  I have reviewed the data as listed    Latest Ref Rng & Units 11/29/2022    9:39 AM 05/24/2022   10:31 AM 04/12/2022    8:20 AM  CMP  Glucose 70 - 99 mg/dL 92  56  96   BUN 6 -  20 mg/dL 19  11  13    Creatinine 0.44 - 1.00 mg/dL 4.54  0.98  1.19   Sodium 135 - 145 mmol/L 140  139  138   Potassium 3.5 - 5.1 mmol/L 3.6  4.5  4.1   Chloride 98 - 111 mmol/L 106  104  106   CO2 22 - 32 mmol/L 28  31  29    Calcium 8.9 - 10.3 mg/dL 9.1  14.7  9.4   Total Protein 6.5 - 8.1 g/dL 6.5  8.2  7.2   Total Bilirubin 0.3 - 1.2 mg/dL 0.2  0.2  0.2   Alkaline Phos 38 - 126 U/L 62  74  71   AST 15 - 41 U/L 13  14  16    ALT 0 - 44 U/L 14  8  13      Lab Results  Component Value Date   WBC 9.6 03/15/2023   HGB 9.2 (L) 03/15/2023   HCT 29.2 (L) 03/15/2023   MCV 88.0 03/15/2023   PLT 451 (H) 03/15/2023   NEUTROABS 6.3 03/15/2023    ASSESSMENT & PLAN:  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 ---s/p Lumpectomy/ /Initial Ca 1997, Brain Mets 2019 Left breast invasive ductal carcinoma ER/PR positive HER-2 positive initially 3.1 cm, Ki-67 70%, HER-2 amplified ratio 2.91 status post neoadjuvant chemotherapy followed by surgery which showed 1.8 cm tumor 1 positive sentinel lymph node T1cN1 M0 stage IB  status post radiation therapy and Herceptin maintenance and took tamoxifen 06/05/2013-08/11/2017   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  SRS brain: 08/25/2017-09/04/2017 2. Anti Her 2 therapy with Lapatinib started 09/17/2017-05/14/2019: Stopped for progression 3.  I discontinued tamoxifen and started her on letrozole 2.5 mg daily.  05/14/2019 stopped for progression 4.  Stereotactic radiosurgery 12/19/2017 to the new right parietal lobe metastases. 5.  Kadcyla: Received 16 cycles discontinued 05/05/2020 6.  Tucatinib Xeloda: Discontinued because of hand-foot syndrome 06/11/2020-12/15/2020 7. Herceptin/Neratinib: unable to tolerate due to diarrhea and weight loss 8.  SRS to new brain mets 10/01/2021 9.  Resection of brain metastases at [facility] 12/29/2022: Metastatic breast cancer proc panel pending 10.  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 Continue with Herceptin maintenance CT CAP 01/26/2023: Unchanged hypodense lesion of hepatic segment 5, no evidence of new metastatic disease in chest abdomen or pelvis bronchiectasis, emphysema, right lower lobe groundglass opacity   Brain MRI 02/01/2023: Slight  further enlargement of the enhancing mass in the central pons 1.3 cm, less vasogenic edema throughout right cerebral hemisphere improved left occipital lobe, posttreatment changes right frontal lobe   Brain tumor board recommendation: Added Avastin 10 mg/kg every 3 weeks 02/21/2023.  I renewed her cough syrup prescription today. She gets echocardiograms every 6 months.  Numbness of the fingers: Unclear etiology.  Patient is seeing Dr. Barbaraann Cao.  Return to clinic every 3 weeks for treatments.  No orders of the defined types were placed in this encounter.  The patient has a good understanding of the overall plan. she agrees with it. she will call with any problems that may develop before the next visit here. Total time spent: 30 mins including face to face time and time spent for planning, charting and co-ordination of care   Tamsen Meek, MD 03/15/23    I Janan Ridge am acting as a Neurosurgeon for [employer]  I have reviewed the above documentation for accuracy and completeness, and I agree with the above.

## 2023-03-14 NOTE — Telephone Encounter (Signed)
Received the following message from patient   Appointment Request From: [name]   With Provider: Paulette Blanch [facility]]   Preferred Date Range: 03/16/2023 - 03/23/2023   Preferred Times: Monday Morning, Tuesday Morning, Wednesday Morning, Thursday Morning, Friday Morning   Reason for visit: Office Visit   Comments: Starting to have more pain in the left lung area and more coughing up bloody mucus.  Scheduling team will contact patient to schedule appointment. Juanita Laster, RMA

## 2023-03-15 ENCOUNTER — Inpatient Hospital Stay: Payer: Commercial Managed Care - HMO | Attending: Medical

## 2023-03-15 ENCOUNTER — Inpatient Hospital Stay: Payer: Commercial Managed Care - HMO | Admitting: Hematology and Oncology

## 2023-03-15 ENCOUNTER — Inpatient Hospital Stay: Payer: Commercial Managed Care - HMO

## 2023-03-15 VITALS — BP 120/70 | HR 82

## 2023-03-15 VITALS — BP 130/76 | HR 90 | Temp 97.7°F | Resp 18 | Ht 62.0 in | Wt 118.7 lb

## 2023-03-15 DIAGNOSIS — Z171 Estrogen receptor negative status [ER-]: Secondary | ICD-10-CM

## 2023-03-15 DIAGNOSIS — C50212 Malignant neoplasm of upper-inner quadrant of left female breast: Secondary | ICD-10-CM | POA: Diagnosis present

## 2023-03-15 DIAGNOSIS — C787 Secondary malignant neoplasm of liver and intrahepatic bile duct: Secondary | ICD-10-CM | POA: Diagnosis not present

## 2023-03-15 DIAGNOSIS — I427 Cardiomyopathy due to drug and external agent: Secondary | ICD-10-CM

## 2023-03-15 DIAGNOSIS — C50912 Malignant neoplasm of unspecified site of left female breast: Secondary | ICD-10-CM

## 2023-03-15 DIAGNOSIS — C7931 Secondary malignant neoplasm of brain: Secondary | ICD-10-CM | POA: Insufficient documentation

## 2023-03-15 DIAGNOSIS — Z5112 Encounter for antineoplastic immunotherapy: Secondary | ICD-10-CM | POA: Diagnosis present

## 2023-03-15 DIAGNOSIS — Z170 Estrogen receptor positive status [ER+]: Secondary | ICD-10-CM | POA: Insufficient documentation

## 2023-03-15 DIAGNOSIS — Z95828 Presence of other vascular implants and grafts: Secondary | ICD-10-CM

## 2023-03-15 DIAGNOSIS — C7951 Secondary malignant neoplasm of bone: Secondary | ICD-10-CM | POA: Insufficient documentation

## 2023-03-15 LAB — CBC WITH DIFFERENTIAL (CANCER CENTER ONLY)
Abs Immature Granulocytes: 0.09 10*3/uL — ABNORMAL HIGH (ref 0.00–0.07)
Basophils Absolute: 0.1 10*3/uL (ref 0.0–0.1)
Basophils Relative: 1 %
Eosinophils Absolute: 0.3 10*3/uL (ref 0.0–0.5)
Eosinophils Relative: 3 %
HCT: 29.2 % — ABNORMAL LOW (ref 36.0–46.0)
Hemoglobin: 9.2 g/dL — ABNORMAL LOW (ref 12.0–15.0)
Immature Granulocytes: 1 %
Lymphocytes Relative: 21 %
Lymphs Abs: 2 10*3/uL (ref 0.7–4.0)
MCH: 27.7 pg (ref 26.0–34.0)
MCHC: 31.5 g/dL (ref 30.0–36.0)
MCV: 88 fL (ref 80.0–100.0)
Monocytes Absolute: 0.8 10*3/uL (ref 0.1–1.0)
Monocytes Relative: 9 %
Neutro Abs: 6.3 10*3/uL (ref 1.7–7.7)
Neutrophils Relative %: 65 %
Platelet Count: 451 10*3/uL — ABNORMAL HIGH (ref 150–400)
RBC: 3.32 MIL/uL — ABNORMAL LOW (ref 3.87–5.11)
RDW: 19.3 % — ABNORMAL HIGH (ref 11.5–15.5)
WBC Count: 9.6 10*3/uL (ref 4.0–10.5)
nRBC: 0 % (ref 0.0–0.2)

## 2023-03-15 MED ORDER — SODIUM CHLORIDE 0.9 % IV SOLN: Freq: Once | INTRAVENOUS | Status: AC

## 2023-03-15 MED ORDER — HYDROCOD POLI-CHLORPHE POLI ER 10-8 MG/5ML PO SUER
5.0000 mL | Freq: Two times a day (BID) | ORAL | 0 refills | Status: DC
Start: 2023-03-15 — End: 2023-04-06

## 2023-03-15 MED ORDER — SODIUM CHLORIDE 0.9% FLUSH
10.0000 mL | INTRAVENOUS | Status: DC | PRN
  Administered 2023-03-15: 10 mL

## 2023-03-15 MED ORDER — SODIUM CHLORIDE 0.9 % IV SOLN
10.0000 mg/kg | Freq: Once | INTRAVENOUS | Status: AC
  Administered 2023-03-15: 500 mg via INTRAVENOUS
  Filled 2023-03-15: qty 4

## 2023-03-15 MED ORDER — TRASTUZUMAB-ANNS CHEMO 150 MG IV SOLR
6.0000 mg/kg | Freq: Once | INTRAVENOUS | Status: AC
  Administered 2023-03-15: 300 mg via INTRAVENOUS
  Filled 2023-03-15: qty 14.29

## 2023-03-15 MED ORDER — ACETAMINOPHEN 325 MG PO TABS
650.0000 mg | ORAL_TABLET | Freq: Once | ORAL | Status: AC
  Administered 2023-03-15: 650 mg via ORAL
  Filled 2023-03-15: qty 2

## 2023-03-15 MED ORDER — DIPHENHYDRAMINE HCL 25 MG PO CAPS
25.0000 mg | ORAL_CAPSULE | Freq: Once | ORAL | Status: AC
  Administered 2023-03-15: 25 mg via ORAL
  Filled 2023-03-15: qty 1

## 2023-03-15 MED ORDER — HEPARIN SOD (PORK) LOCK FLUSH 100 UNIT/ML IV SOLN
500.0000 [IU] | Freq: Once | INTRAVENOUS | Status: AC | PRN
  Administered 2023-03-15: 500 [IU]

## 2023-03-15 NOTE — Assessment & Plan Note (Signed)
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  SRS brain: 08/25/2017-09/04/2017 2. Anti Her 2 therapy with Lapatinib started 09/17/2017-05/14/2019: Stopped for progression 3.  I discontinued tamoxifen and started her on letrozole 2.5 mg daily.  05/14/2019 stopped for progression 4.  Stereotactic radiosurgery 12/19/2017 to the new right parietal lobe metastases. 5.  Kadcyla: Received 16 cycles discontinued 05/05/2020 6.  Tucatinib Xeloda: Discontinued because of hand-foot syndrome 06/11/2020-12/15/2020 7. Herceptin/Neratinib: unable to tolerate due to diarrhea and weight loss 8.  SRS to new brain mets 10/01/2021 9.  Resection of brain metastases at [facility] 12/29/2022: Metastatic breast cancer proc panel pending 10.  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 Continue  with Herceptin maintenance CT CAP 01/26/2023: Unchanged hypodense lesion of hepatic segment 5, no evidence of new metastatic disease in chest abdomen or pelvis bronchiectasis, emphysema, right lower lobe groundglass opacity   Brain MRI 02/01/2023: Slight further enlargement of the enhancing mass in the central pons 1.3 cm, less vasogenic edema throughout right cerebral hemisphere improved left occipital lobe, posttreatment changes right frontal lobe   Brain tumor board recommendation: Added Avastin 10 mg/kg every 3 weeks 02/21/2023.    Return to clinic every 3 weeks for treatments.

## 2023-03-15 NOTE — Patient Instructions (Signed)
[place] [facility]  Discharge Instructions: Thank you for choosing [facility] to provide your oncology and hematology care.   If you have a lab appointment with the Cancer Center, please go directly to the Cancer Center and check in at the registration area.   Wear comfortable clothing and clothing appropriate for easy access to any Portacath or PICC line.   We strive to give you quality time with your provider. You may need to reschedule your appointment if you arrive late (15 or more minutes).  Arriving late affects you and other patients whose appointments are after yours.  Also, if you miss three or more appointments without notifying the office, you may be dismissed from the clinic at the provider's discretion.      For prescription refill requests, have your pharmacy contact our office and allow 72 hours for refills to be completed.    Today you received the following chemotherapy and/or immunotherapy agents Trastuzumab and Bevacizumab   To help prevent nausea and vomiting after your treatment, we encourage you to take your nausea medication as directed.  BELOW ARE SYMPTOMS THAT SHOULD BE REPORTED IMMEDIATELY: *FEVER GREATER THAN 100.4 F (38 C) OR HIGHER *CHILLS OR SWEATING *NAUSEA AND VOMITING THAT IS NOT CONTROLLED WITH YOUR NAUSEA MEDICATION *UNUSUAL SHORTNESS OF BREATH *UNUSUAL BRUISING OR BLEEDING *URINARY PROBLEMS (pain or burning when urinating, or frequent urination) *BOWEL PROBLEMS (unusual diarrhea, constipation, pain near the anus) TENDERNESS IN MOUTH AND THROAT WITH OR WITHOUT PRESENCE OF ULCERS (sore throat, sores in mouth, or a toothache) UNUSUAL RASH, SWELLING OR PAIN  UNUSUAL VAGINAL DISCHARGE OR ITCHING   Items with * indicate a potential emergency and should be followed up as soon as possible or go to the Emergency Department if any problems should occur.  Please show the CHEMOTHERAPY ALERT CARD or IMMUNOTHERAPY ALERT  CARD at check-in to the Emergency Department and triage nurse.  Should you have questions after your visit or need to cancel or reschedule your appointment, please contact [place] [facility]  Dept: [phone]  and follow the prompts.  Office hours are 8:00 a.m. to 4:30 p.m. Monday - Friday. Please note that voicemails left after 4:00 p.m. may not be returned until the following business day.  We are closed weekends and major holidays. You have access to a nurse at all times for urgent questions. Please call the main number to the clinic Dept: [phone] and follow the prompts.   For any non-urgent questions, you may also contact your provider using MyChart. We now offer e-Visits for anyone 29 and older to request care online for non-urgent symptoms. For details visit [email or web address].   Also download the MyChart app! Go to the app store, search "MyChart", open the app, select [place], and log in with your MyChart username and password.

## 2023-03-17 NOTE — Addendum Note (Signed)
Encounter addended by: Marcello Fennel, PA-C on: 03/17/2023 12:04 PM  Actions taken: Clinical Note Signed

## 2023-03-22 ENCOUNTER — Other Ambulatory Visit: Payer: Self-pay

## 2023-03-24 NOTE — Progress Notes (Addendum)
Radiation Oncology         [phone] ________________________________  Name: Melissa [name] MRN: [id number]  Date of Service: 03/24/2023 DOB: 1965-12-30  Post Treatment Telephone Note  Diagnosis:  57 yo woman s/p resection of a 3.2 cm right temporal lobe metastasis, with a history of ER+ HER2+ cancer in the upper inner quadrant of the left breast (as documented in provider EOT note).  Patient's current complaint is that she is having a moderate amount of yellowish discharge in her RT eye in the mornings w/ some mild burning, that is slowly worsening. Patient has an upcoming apt w/ Dr. Barbaraann Cao on 04/17/2023.  The patient was available for call today.  The patient did not note fatigue during radiation but is having some mild fatigue. The patient did not note hair loss or skin changes in the field of radiation during therapy.   The patient "is currently" taking dexamethasone every other day, due to this medication keeping her up at night.   The patient does not have symptoms of  weakness or loss of control of the extremities. The patient does not have symptoms of headache. The patient does not have symptoms of seizure or uncontrolled movement. The patient does not have symptoms of changes in vision. The patient does not have changes in speech. The patient does not have confusion.   The patient was counseled that she will be contacted by our brain and spine navigator to schedule surveillance imaging. The patient was encouraged to call if she have not received a call to schedule imaging, or if she develops concerns or questions regarding radiation. The patient will also continue to follow up with Dr. Barbaraann Cao in medical oncology.  This concludes the interaction.  Ruel Favors, LPN

## 2023-03-26 ENCOUNTER — Other Ambulatory Visit: Payer: Self-pay

## 2023-03-27 ENCOUNTER — Encounter: Payer: Self-pay | Admitting: Internal Medicine

## 2023-03-27 ENCOUNTER — Ambulatory Visit (INDEPENDENT_AMBULATORY_CARE_PROVIDER_SITE_OTHER): Payer: Commercial Managed Care - HMO | Admitting: Internal Medicine

## 2023-03-27 ENCOUNTER — Encounter: Payer: Self-pay | Admitting: Hematology and Oncology

## 2023-03-27 ENCOUNTER — Other Ambulatory Visit: Payer: Self-pay

## 2023-03-27 VITALS — BP 96/70 | HR 86 | Temp 97.7°F | Ht 62.0 in | Wt 120.0 lb

## 2023-03-27 DIAGNOSIS — F17210 Nicotine dependence, cigarettes, uncomplicated: Secondary | ICD-10-CM

## 2023-03-27 DIAGNOSIS — H5789 Other specified disorders of eye and adnexa: Secondary | ICD-10-CM

## 2023-03-27 DIAGNOSIS — B479 Mycetoma, unspecified: Secondary | ICD-10-CM

## 2023-03-27 NOTE — Progress Notes (Addendum)
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/Initial Ca 1997, Brain Mets 2019 06/10/2019   Mediastinal adenopathy    H/O coccidioidomycosis and Aspergillosus 05/16/2018   DOE (dyspnea on exertion) 05/03/2018   Diarrhea 04/22/2018   Cavitary lesion of lung in area of previous cyts in apex of Sup Segment of LLL 03/29/2018   Aortic atherosclerosis (HCC) 01/22/2018   Emphysema lung (HCC) 01/22/2018   CAP (community acquired pneumonia) 11/23/2017   Metastasis to brain (HCC) 08/04/2017   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 (HCC) 07/27/2017   Nicotine abuse 07/27/2017   Migraines 07/27/2017   HCAP (healthcare-associated pneumonia) 11/26/2016   Upper airway cough syndrome 08/10/2016   Abnormal echocardiogram 08/07/2013   Chest tightness or pressure 08/07/2013   Hx of radiation therapy    Edema of left lower extremity 11/19/2012   Tachycardia 09/20/2012   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 ---s/p Lumpectomy/ /Initial Ca 1997, Brain Mets 2019 06/08/2012   Obstructive bronchiectasis (HCC) 01/26/2011   Cigarette smoker 01/26/2011     Subjective:  Melissa [name] is a 57 y.o. F presents for F/U for mycetoma.  Pt is taking voriconazole. Reports a cough that has been going on for a few weeks. No fever or chills. Would also like to discuss CT done in July, 2024. Reports drainage from right  eye.   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  Along the way she was diagnosed with breast cancer 2013 underwent lumpectomy and chemotherapy.  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  She is following with Melissa [name] with oncology.  She is subsequent been found to have other metastases including to her sternum and liver.   She developed cavitary lung infection and underwent bronchoscopy which revealed Aspergillus fumigatus.  She was on Cresemba since then.   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  She also has some spiculated lesions in the posterior left lobe lobe of her lung.  Radiology is concerned that the apparent mycetoma is due to Aspergillus or another mold.  There was similar concerned that the spiculated areas could represent Aspergillus though we have no culture data to guide [name].   She continued on Cresemba at that point   She then had bronchoscopy with BAL.   Fungal cultures from the bronchoscopy have been negative though the patient was on Cresemba at the time.  There is 1 AFB culture that is  positive but not for tuberculosis.   Since then apparently (and I had to go to [facility] on web (not available on epic) they stated that Mycobacteria could NOT grow on subculture so no ID   In the interim the patient's insurance had denied Cresemba and she has been changed to voriconazole.  Unfortunately she has been found to have metastases of her breast cancer to the brain.   She   underwent stereotactic radiation to these lesions by Melissa [name]   Her chemotherapeutic regimen previously included Tukysa (tucatinib) which can elevate voriconazole levels and potentially cause voriconazole toxicity including QT prolongation.   We checjked her voriconazole levels and they have been in the therapeutic range recently   EKG showed:  her EKG showed a QTC of 448 with a QTC of 465 ms last EKG that I could find in the computer that I could actually read was in 2019 in which she had a QTC of 442.     She did tell us that she is coughing less since being on voriconazole.   .   She had MRI of the brain done this February 2022 showed decrease in size of 3 lesions but multiple new lesions.   Her regimen was Xeloda Herceptin and Tucatinib   I last saw her she was developing a rash on her hands which worsened as well as on her lips.   Ultimately her oncologist Melissa [name] stopped her Xeloda and Tucatinib and she is on Herceptin alone.    In the interim she then had CT chest abdomen pelvis.  From an infectious disease standpoint there is increase in her cavitation which is not altogether surprising and there were areas of increased nodularity and specially in the left lower lung.   She was bothered by this and sought second opinion at [name] and see by Melissa [name] who carefully reviewed her case and felt that there was no need for further aggressive inventions to work this up nor need to change antifungal therapy.   She also had an MRI of the brain that showed new lesions.  She was seen by Stereotactic Radiosurgery to 3 lesions on March 17 2021.   She started new chemotherapy in the form of neratinib whose drug concentrations are increased by voriconazole.  However Dr Melissa [name] started at a lower dose which they have tried to escalate.  She told me she took 3 pills without difficulty but when she went to 5 pills she had severe diarrhea and had to be given  IV fluids for rehydration.     She is now off of neratinib but still on herceptin   He did again have stereotactic surgery for brain metastasis with Melissa [name] in December 2000 2022   She had a CT of the chest performed in January 2023:   This did not show PE as it was a CT angiogram did show increased opacity in left lung base with some minimal effusion with multiple serpiginous cystic lesions that were stable.  Also extensive emphysematous changes seen.   Shortly after getting her CT scan she developed neck pain chest pain which she seemed to be pleurisy to her and was ultimately mid to the [name] and treated for pneumonia with IV antibiotics followed by doxycycline and cefdinir.  She does not tolerate these antibiotics particularly doxycycline.  She saw my partner Dr. Elinor Parkinson in clinic while she is off antibiotics and she gave  Augmentin to finish a course of  antibiotics.  Rhyli has undergone repeat SRS to brain metastases in March of 2023 and remains on herceptin alone.   Her coughing had improved dramatically since I last saw her she says she is smoking less than half a pack of cigarettes sometimes only 10/day now.  She is taking her voriconazole religiously.  Her husband  also had quit smoking altogether.   Amanda is being followed by Melissa [name] and is on herceptin.    She iwas also taking fenbendazole (antiheminthic for canines) and multiple other suppllements in hopes it will help her vs her cancer.    CT chest abdomen pelvis performed in December 2023 showed the following:   CT CHEST IMPRESSION   1.  No acute process or evidence of metastatic disease in the chest. 2. Left upper lobectomy. Primarily similar appearance of left lower lobe areas of presumed marked cystic bronchiectasis. Areas of nodular consolidation, some of which are minimally progressive, are consistent with acute on chronic atypical infection. 3. Aortic atherosclerosis (ICD10-I70.0) and emphysema  (ICD10-J43.9).   CT ABDOMEN AND PELVIS IMPRESSION   1. Further decrease in size of a right hepatic lobe treated metastasis. 2. No new or progressive disease.   MRI brain in January showed:    IMPRESSION: 1. Enhancing lesions within the anterior right temporal lobe, left parietooccipital lobes, superior cerebellar vermis and within the pons have increased in size since the prior brain MRI of 04/13/2022, as described. Edema surrounding the lesions within the right temporal lobe and left parietooccipital lobes has progressed. Mass effect related to the right temporal lobe lesion has also progressed, now with 4 mm leftward midline shift. Additionally, the medial right temporal lobe now exerts some mass effect upon the right cerebral peduncle. 2. The remaining lesions within the supratentorial and infratentorial brain have not significantly changed. No new intracranial metastasis is identified.     She continued on every 3-week Herceptin  Dr. Barbaraann Cao started her on steroids and an MRI of the brain is being planned for first week of March.    Anayancy has found her quite "wired while on the steroids and not been able to tolerate the higher dose that she was on.   She is been quite hungry as well and ingesting ice cream late at night which she thinks is causing her to cough more during the day.   I did note the drug drug interaction between voriconazole and dexamethasone with voriconazole and increasing levels of dexamethasone.   I would presume that this drug interactions being taken to account but would CC her oncology team about this."  Review of Systems  All other systems reviewed and are negative.   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   Asthma    Breast cancer (HCC)    Bronchiectasis (HCC)    Bronchiolitis    Cancer (HCC)    breast cancer 2014   Cancer, metastatic to liver (HCC)    2021    Complication of anesthesia    bp dropped + desat    COPD (chronic obstructive pulmonary disease) (HCC)    Dyspnea    DOE   GERD (gastroesophageal reflux disease)    H/O coccidioidomycosis    was reason for lung lobectomy   Headache(784.0)    due to eye strain or not eating   History of anemia    no current problem   History of asthma  as a child   History of breast cancer 2014   left   History of chemotherapy    finished 07/17/2013   History of hiatal hernia    AGE 53   Hx of radiation therapy 03/25/13-05/06/13   left breast 5000 cGy/25 sessions, left breast boost 1000 cGy/5 sessions   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 12 tablet 0   albuterol (VENTOLIN HFA) 108 (90 Base) MCG/ACT inhaler Inhale 2 puffs into the lungs every 6 (six) hours as needed for wheezing or shortness of breath (cough). 18 g 5   calcium carbonate (TUMS EX) 750 MG chewable tablet Chew by mouth.     chlorpheniramine-HYDROcodone (TUSSIONEX) 10-8 MG/5ML Take 5 mLs by mouth 2 (two) times daily. 115 mL 0   dexamethasone (DECADRON) 2 MG tablet Take 1 tablet (2 mg total) by mouth daily. 60 tablet 1   Fluticasone-Salmeterol,sensor, (AIRDUO DIGIHALER) 113-14 MCG/ACT AEPB Inhale 1 puff into the lungs 2 (two) times daily. 1 each 4   GARLIC PO Take by mouth.     levalbuterol (XOPENEX) 0.63 MG/3ML nebulizer solution Take 3 mLs (0.63 mg total) by nebulization every 4 (four) hours as needed for wheezing or shortness of breath. 90 mL 12   lidocaine-prilocaine (EMLA) cream APPLY EXTERNALLY TO THE AFFECTED AREA 1 TIME 30 g 3   naproxen sodium (ALEVE) 220 MG tablet Take by mouth.     OIL  OF OREGANO PO Take 500 mg by mouth 2 (two) times daily.     SYMBICORT 160-4.5 MCG/ACT inhaler Inhale 2 puffs into the lungs 2 (two) times daily.     voriconazole (VFEND) 200 MG tablet Take 1 tablet (200 mg total) by mouth 2 (two) times daily. 60 tablet 11   Cholecalciferol 250 MCG (10000 UT) CAPS Take by mouth. (Patient not taking: Reported on 03/27/2023)     Multiple Vitamins-Minerals (EMERGEN-C VITAMIN C PO) Take by mouth. (Patient not taking: Reported on 03/27/2023)     ondansetron (ZOFRAN-ODT) 4 MG disintegrating tablet Take 4 mg by mouth every 8 (eight) hours as needed. (Patient not taking: Reported on 03/27/2023)     Vitamin E (VITAMIN E/D-ALPHA NATURAL) 268 MG (400 UNIT) CAPS Take by mouth. (Patient not taking: Reported on 03/27/2023)     Facility-Administered Medications Prior to Visit  Medication Dose Route Frequency Provider Last Rate Last Admin   alteplase (CATHFLO ACTIVASE) injection 2 mg  2 mg Intracatheter Once PRN Serena Croissant, MD       sodium chloride flush (NS) 0.9 % injection 10 mL  10 mL Intracatheter PRN Serena Croissant, MD   10 mL at 07/06/21 1428     Allergies  Allergen Reactions   Aspirin Anaphylaxis and Shortness Of Breath    THROAT CLOSES   Protonix [Pantoprazole] Nausea Only and Other (See Comments)    Also caused a "film in the mouth" and caused chest pressure   Doxycycline Other (See Comments)    Hallucinations, headaches   Promethazine-Codeine Cough    Worsening cough and insomnia    Sulfamethoxazole-Trimethoprim Other (See Comments)   Iodinated Contrast Media  Rash    Patient allergic to contrast used in radiation oncology for CT simulation     Social History   Tobacco Use   Smoking status: Every Day    Current packs/day: 0.30    Average packs/day: 0.3 packs/day for 38.0 years (11.4 ttl pk-yrs)    Types: Cigarettes   Smokeless tobacco: Never   Tobacco comments:    10 cigarettes smoked daily 10/19/20 [facility]   Vaping Use   Vaping status: Never Used   Substance Use Topics   Alcohol use: No   Drug use: No    Family History  Problem Relation Age of Onset   Emphysema Mother        was a smoker   Heart disease Mother    Melanoma Mother        dx in her 36s   Breast cancer Mother 47   Asthma Brother    Breast cancer Cousin        mother's maternal cousin; dx in her 59s    Objective:   Vitals:   03/27/23 1520  BP: 96/70  Pulse: 86  Temp: 97.7 F (36.5 C)  TempSrc: Temporal  SpO2: 99%  Weight: 120 lb (54.4 kg)  Height: 5\' 2"  (1.575 m)   Body mass index is 21.95 kg/m.  Physical Exam Constitutional:      Appearance: Normal appearance.  HENT:     Head: Normocephalic and atraumatic.     Right Ear: Tympanic membrane normal.     Left Ear: Tympanic membrane normal.     Nose: Nose normal.     Mouth/Throat:     Mouth: Mucous membranes are moist.  Eyes:     Extraocular Movements: Extraocular movements intact.     Conjunctiva/sclera: Conjunctivae normal.     Pupils: Pupils are equal, round, and reactive to light.  Cardiovascular:     Rate and Rhythm: Normal rate and regular rhythm.     Heart sounds: No murmur heard.    No friction rub. No gallop.  Pulmonary:     Effort: Pulmonary effort is normal.     Breath sounds: Normal breath sounds.  Abdominal:     General: Abdomen is flat.     Palpations: Abdomen is soft.  Skin:    General: Skin is warm and dry.  Neurological:     General: No focal deficit present.     Mental Status: She is alert and oriented to person, place, and time.  Psychiatric:        Mood and Affect: Mood normal.     Lab Results: Lab Results  Component Value Date   WBC 9.6 03/15/2023   HGB 9.2 (L) 03/15/2023   HCT 29.2 (L) 03/15/2023   MCV 88.0 03/15/2023   PLT 451 (H) 03/15/2023    Lab Results  Component Value Date   CREATININE 0.76 11/29/2022   BUN 19 11/29/2022   NA 140 11/29/2022   K 3.6 11/29/2022   CL 106 11/29/2022   CO2 28 11/29/2022    Lab Results  Component Value Date    ALT 14 11/29/2022   AST 13 (L) 11/29/2022   ALKPHOS 62 11/29/2022   BILITOT 0.2 (L) 11/29/2022     Assessment & Plan:  #Myecetoma -Pt thinks pt has an "infection in eye".  Pt states it burns and it is sorence brain sruger she has dischrge from eye, clumps up. Right nostril gets dry and hurts to touch it. PT uses saline to wet it, (1.5 months) -No  longer follows with pulm. Cough ing up phlegm started worse 2 weeks ago. This it may be related to her ye. Last brian surgery June 20th. Pt is a smoker Plan: -Counseled to stop by lab for Vori trough tomorrow at lab visit(12 hours from last dose) -CT shows a new nonspecific finding in right lung. Pt generally get CT q 6months. Given her increased phlegm(alhtough pt continues to smoke) will give pt f/u in one month to deicde if she needs ct following pulmonology f/u. -F/U with pulmonology -Refer to opthomology -She has ID f/uo n 10/28 with Dr. Daiva Eves  #Discussed smoking cessation Danelle Earthly, MD [name]   03/27/23  3:33 PM   I have personally spent 52 minutes involved in face-to-face and non-face-to-face activities for this patient on the day of the visit. Professional time spent includes the following activities: Preparing to see the patient (review of tests), Obtaining and/or reviewing separately obtained history (admission/discharge record), Performing a medically appropriate examination and/or evaluation , Ordering medications/tests/procedures, referring and communicating with other health care professionals, Documenting clinical information in the EMR, Independently interpreting results (not separately reported), Communicating results to the patient/family/caregiver, Counseling and educating the patient/family/caregiver and Care coordination (not separately reported).

## 2023-03-28 ENCOUNTER — Other Ambulatory Visit: Payer: Self-pay

## 2023-03-28 ENCOUNTER — Other Ambulatory Visit: Payer: Commercial Managed Care - HMO

## 2023-03-28 ENCOUNTER — Ambulatory Visit
Admission: RE | Admit: 2023-03-28 | Discharge: 2023-03-28 | Disposition: A | Discharge status: Home / Self Care | Payer: Commercial Managed Care - HMO | Source: Ambulatory Visit | Attending: Hematology and Oncology | Admitting: Hematology and Oncology

## 2023-03-28 DIAGNOSIS — B479 Mycetoma, unspecified: Secondary | ICD-10-CM

## 2023-03-29 ENCOUNTER — Other Ambulatory Visit: Payer: Self-pay

## 2023-03-29 ENCOUNTER — Encounter: Payer: Self-pay | Admitting: Hematology and Oncology

## 2023-03-31 ENCOUNTER — Encounter: Payer: Self-pay | Admitting: Hematology and Oncology

## 2023-03-31 LAB — CBC WITH DIFFERENTIAL/PLATELET
Absolute Monocytes: 980 cells/uL — ABNORMAL HIGH (ref 200–950)
Basophils Absolute: 46 cells/uL (ref 0–200)
Basophils Relative: 0.4 %
Eosinophils Absolute: 365 cells/uL (ref 15–500)
Eosinophils Relative: 3.2 %
HCT: 30.3 % — ABNORMAL LOW (ref 35.0–45.0)
Hemoglobin: 9.5 g/dL — ABNORMAL LOW (ref 11.7–15.5)
Lymphs Abs: 2006 cells/uL (ref 850–3900)
MCH: 27.6 pg (ref 27.0–33.0)
MCHC: 31.4 g/dL — ABNORMAL LOW (ref 32.0–36.0)
MCV: 88.1 fL (ref 80.0–100.0)
MPV: 8.6 fL (ref 7.5–12.5)
Monocytes Relative: 8.6 %
Neutro Abs: 8003 cells/uL — ABNORMAL HIGH (ref 1500–7800)
Neutrophils Relative %: 70.2 %
Platelets: 550 10*3/uL — ABNORMAL HIGH (ref 140–400)
RBC: 3.44 10*6/uL — ABNORMAL LOW (ref 3.80–5.10)
RDW: 17.9 % — ABNORMAL HIGH (ref 11.0–15.0)
Total Lymphocyte: 17.6 %
WBC: 11.4 10*3/uL — ABNORMAL HIGH (ref 3.8–10.8)

## 2023-03-31 LAB — COMPLETE METABOLIC PANEL WITH GFR
AG Ratio: 1.1 (calc) (ref 1.0–2.5)
ALT: 13 U/L (ref 6–29)
AST: 13 U/L (ref 10–35)
Albumin: 3.3 g/dL — ABNORMAL LOW (ref 3.6–5.1)
Alkaline phosphatase (APISO): 91 U/L (ref 37–153)
BUN: 9 mg/dL (ref 7–25)
CO2: 24 mmol/L (ref 20–32)
Calcium: 9.3 mg/dL (ref 8.6–10.4)
Chloride: 101 mmol/L (ref 98–110)
Creat: 0.83 mg/dL (ref 0.50–1.03)
Globulin: 3 g/dL (calc) (ref 1.9–3.7)
Glucose, Bld: 84 mg/dL (ref 65–99)
Potassium: 4.7 mmol/L (ref 3.5–5.3)
Sodium: 136 mmol/L (ref 135–146)
Total Bilirubin: 0.2 mg/dL (ref 0.2–1.2)
Total Protein: 6.3 g/dL (ref 6.1–8.1)
eGFR: 83 mL/min/{1.73_m2} (ref >=60)

## 2023-03-31 LAB — VORICONAZOLE QUANT BY LC/MS: Voriconazole, Quant, by LC/MS: 1.1 ug/mL

## 2023-04-05 ENCOUNTER — Inpatient Hospital Stay: Payer: Commercial Managed Care - HMO | Admitting: Hematology and Oncology

## 2023-04-05 ENCOUNTER — Inpatient Hospital Stay: Payer: Commercial Managed Care - HMO | Attending: Hematology and Oncology

## 2023-04-05 ENCOUNTER — Inpatient Hospital Stay: Payer: Commercial Managed Care - HMO

## 2023-04-05 VITALS — BP 109/67 | HR 96 | Temp 97.2°F | Resp 18 | Ht 62.0 in | Wt 119.1 lb

## 2023-04-05 DIAGNOSIS — Z95828 Presence of other vascular implants and grafts: Secondary | ICD-10-CM

## 2023-04-05 DIAGNOSIS — C50212 Malignant neoplasm of upper-inner quadrant of left female breast: Secondary | ICD-10-CM

## 2023-04-05 DIAGNOSIS — Z171 Estrogen receptor negative status [ER-]: Secondary | ICD-10-CM | POA: Diagnosis not present

## 2023-04-05 DIAGNOSIS — Z5112 Encounter for antineoplastic immunotherapy: Secondary | ICD-10-CM | POA: Diagnosis not present

## 2023-04-05 DIAGNOSIS — C50912 Malignant neoplasm of unspecified site of left female breast: Secondary | ICD-10-CM

## 2023-04-05 LAB — CBC WITH DIFFERENTIAL (CANCER CENTER ONLY)
Abs Immature Granulocytes: 0.06 10*3/uL (ref 0.00–0.07)
Basophils Absolute: 0.1 10*3/uL (ref 0.0–0.1)
Basophils Relative: 1 %
Eosinophils Absolute: 0.8 10*3/uL — ABNORMAL HIGH (ref 0.0–0.5)
Eosinophils Relative: 7 %
HCT: 30.3 % — ABNORMAL LOW (ref 36.0–46.0)
Hemoglobin: 9.2 g/dL — ABNORMAL LOW (ref 12.0–15.0)
Immature Granulocytes: 1 %
Lymphocytes Relative: 15 %
Lymphs Abs: 1.8 10*3/uL (ref 0.7–4.0)
MCH: 27.8 pg (ref 26.0–34.0)
MCHC: 30.4 g/dL (ref 30.0–36.0)
MCV: 91.5 fL (ref 80.0–100.0)
Monocytes Absolute: 0.9 10*3/uL (ref 0.1–1.0)
Monocytes Relative: 8 %
Neutro Abs: 8 10*3/uL — ABNORMAL HIGH (ref 1.7–7.7)
Neutrophils Relative %: 68 %
Platelet Count: 474 10*3/uL — ABNORMAL HIGH (ref 150–400)
RBC: 3.31 MIL/uL — ABNORMAL LOW (ref 3.87–5.11)
RDW: 19.5 % — ABNORMAL HIGH (ref 11.5–15.5)
WBC Count: 11.6 10*3/uL — ABNORMAL HIGH (ref 4.0–10.5)
nRBC: 0 % (ref 0.0–0.2)

## 2023-04-05 LAB — TOTAL PROTEIN, URINE DIPSTICK: Protein, ur: NEGATIVE mg/dL

## 2023-04-05 MED ORDER — DIPHENHYDRAMINE HCL 25 MG PO CAPS
25.0000 mg | ORAL_CAPSULE | Freq: Once | ORAL | Status: AC
  Administered 2023-04-05: 25 mg via ORAL
  Filled 2023-04-05: qty 1

## 2023-04-05 MED ORDER — ALTEPLASE 2 MG IJ SOLR
2.0000 mg | Freq: Once | INTRAMUSCULAR | Status: AC | PRN
  Administered 2023-04-05: 2 mg
  Filled 2023-04-05: qty 2

## 2023-04-05 MED ORDER — TRASTUZUMAB-ANNS CHEMO 150 MG IV SOLR
6.0000 mg/kg | Freq: Once | INTRAVENOUS | Status: AC
  Administered 2023-04-05: 300 mg via INTRAVENOUS
  Filled 2023-04-05: qty 14.29

## 2023-04-05 MED ORDER — SODIUM CHLORIDE 0.9% FLUSH
10.0000 mL | INTRAVENOUS | Status: DC | PRN
  Administered 2023-04-05: 10 mL

## 2023-04-05 MED ORDER — HEPARIN SOD (PORK) LOCK FLUSH 100 UNIT/ML IV SOLN
500.0000 [IU] | Freq: Once | INTRAVENOUS | Status: AC | PRN
  Administered 2023-04-05: 500 [IU]

## 2023-04-05 MED ORDER — ACETAMINOPHEN 325 MG PO TABS
650.0000 mg | ORAL_TABLET | Freq: Once | ORAL | Status: AC
  Administered 2023-04-05: 650 mg via ORAL
  Filled 2023-04-05: qty 2

## 2023-04-05 MED ORDER — SODIUM CHLORIDE 0.9 % IV SOLN: Freq: Once | INTRAVENOUS | Status: AC

## 2023-04-05 MED ORDER — SODIUM CHLORIDE 0.9 % IV SOLN
10.0000 mg/kg | Freq: Once | INTRAVENOUS | Status: AC
  Administered 2023-04-05: 500 mg via INTRAVENOUS
  Filled 2023-04-05: qty 4

## 2023-04-05 NOTE — Patient Instructions (Signed)
[facility]  Discharge Instructions: Thank you for choosing [facility] to provide your oncology and hematology care.   If you have a lab appointment with the Cancer Center, please go directly to the Cancer Center and check in at the registration area.   Wear comfortable clothing and clothing appropriate for easy access to any Portacath or PICC line.   We strive to give you quality time with your provider. You may need to reschedule your appointment if you arrive late (15 or more minutes).  Arriving late affects you and other patients whose appointments are after yours.  Also, if you miss three or more appointments without notifying the office, you may be dismissed from the clinic at the provider's discretion.      For prescription refill requests, have your pharmacy contact our office and allow 72 hours for refills to be completed.    Today you received the following chemotherapy and/or immunotherapy agents Trastuzumab and Bevacizumab   To help prevent nausea and vomiting after your treatment, we encourage you to take your nausea medication as directed.  BELOW ARE SYMPTOMS THAT SHOULD BE REPORTED IMMEDIATELY: *FEVER GREATER THAN 100.4 F (38 C) OR HIGHER *CHILLS OR SWEATING *NAUSEA AND VOMITING THAT IS NOT CONTROLLED WITH YOUR NAUSEA MEDICATION *UNUSUAL SHORTNESS OF BREATH *UNUSUAL BRUISING OR BLEEDING *URINARY PROBLEMS (pain or burning when urinating, or frequent urination) *BOWEL PROBLEMS (unusual diarrhea, constipation, pain near the anus) TENDERNESS IN MOUTH AND THROAT WITH OR WITHOUT PRESENCE OF ULCERS (sore throat, sores in mouth, or a toothache) UNUSUAL RASH, SWELLING OR PAIN  UNUSUAL VAGINAL DISCHARGE OR ITCHING   Items with * indicate a potential emergency and should be followed up as soon as possible or go to the Emergency Department if any problems should occur.  Please show the CHEMOTHERAPY ALERT CARD or IMMUNOTHERAPY ALERT  CARD at check-in to the Emergency Department and triage nurse.  Should you have questions after your visit or need to cancel or reschedule your appointment, please contact [facility]  Dept: [phone]  and follow the prompts.  Office hours are 8:00 a.m. to 4:30 p.m. Monday - Friday. Please note that voicemails left after 4:00 p.m. may not be returned until the following business day.  We are closed weekends and major holidays. You have access to a nurse at all times for urgent questions. Please call the main number to the clinic Dept: [phone] and follow the prompts.   For any non-urgent questions, you may also contact your provider using MyChart. We now offer e-Visits for anyone 49 and older to request care online for non-urgent symptoms. For details visit [email or web address].   Also download the MyChart app! Go to the app store, search "MyChart", open the app, select [facility], and log in with your MyChart username and password.

## 2023-04-05 NOTE — Progress Notes (Signed)
Patient Care Team: Thana Ates, MD as PCP - General (Internal Medicine) Domenick Gong, MD as Referring Physician (Emergency Medicine)  DIAGNOSIS:  Encounter Diagnosis  Name Primary?   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 Ki-67 was 70% HER-2 was amplified with a ratio 2.91   07/12/2012 - 07/17/2013 Neo-Adjuvant Chemotherapy   TCH 6 followed by Herceptin maintenance   12/11/2012 Surgery   Left breast lumpectomy: 1.8 cm tumor 1 positive sentinel node, axillary lymph node dissection 02/08/2013 showed 0/13 lymph nodes   03/25/2013 - 05/06/2013 Radiation Therapy   Adjuvant radiation therapy   06/05/2013 - 07/20/2017 Anti-estrogen oral therapy   Tamoxifen 20 mg daily   07/27/2017 Relapse/Recurrence   MRI Brain: 3.4 x 2.9 x 2.9 cm RIGHT frontal lobe mass with imaging characteristics of solitary metastasis. Extensive vasogenic edema resulting in 9 mm RIGHT to LEFT midline shift. Equivocal very early LEFT ventricle entrapment.    08/04/2017 Surgery   Rt frontal brain resection: Poorly differentiated tumor IHC suggests breast primary ER and PR Positive   08/25/2017 - 09/04/2017 Radiation Therapy   Stereotactic radiation   09/18/2017 -  Anti-estrogen oral therapy   Lapatinib with letrozole   12/18/2017 - 12/19/2017 Radiation Therapy   New right parietal lobe metastases status post [facility]   08/27/2018 - 08/27/2020 Radiation Therapy   SRS to new brain metastases   05/09/2019 Relapse/Recurrence   Interval increase in size of the enhancing nodular left internal mammary soft tissue 2.8 cm.  Redemonstrated  enlarged supraclavicular, lower cervical and lower posterior cervical nodes unchanged.  Interval increase in the bony erosion of the posterior and lateral left third rib, increasing soft tissue lesion eroding the left sternal body 3.1 cm was 2.5 cm.  Bronchiectatic changes   06/19/2019 - 05/05/2020 Chemotherapy   ado-trastuzumab emtansine (KADCYLA)     06/15/2020 -  Chemotherapy   Xeloda, Tucatinib, Herceptin    04/12/2022 - 09/06/2022 Chemotherapy   Patient is on Treatment Plan : BREAST Trastuzumab IV (8/6) or SQ (600) D1 q21d     09/27/2022 -  Chemotherapy   Patient is on Treatment Plan : BREAST MAINTENANCE Trastuzumab IV (6) or SQ (600) D1 q21d x 13 cycles. Added bevacizumab (10) q21d starting cycle 8.     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 Added bevacizumab (10) q21d starting cycle 8.     Port-A-Cath in place    CHIEF COMPLIANT: Follow-up on Avastin and Herceptin    History of Present Illness   The patient, with a history of brain cancer, is currently receiving  Avastin and Herceptin treatments every three weeks. She reports increased fatigue since starting Avastin, but no other side effects. She denies nosebleeds and blood in the stool or urine. Her blood pressure has been stable.  The patient also reports insomnia, often only sleeping four hours a night. She has been off steroids for a week, which she thought was causing her  sleep issues, but the insomnia persists. She tries to compensate for the lack of sleep at night by taking naps during the day. She has tried various strategies to improve her sleep, including not eating late at night and avoiding bright lights from electronic devices before bed.  Additionally, the patient has been experiencing eye issues, including mucus discharge, burning, and difficulty opening her eye in the morning. She saw an ophthalmologist who attributed these symptoms to the drying effects of radiation treatment for her brain cancer. She was prescribed two different types of eye drops, which have already started to alleviate her symptoms.         ALLERGIES:  is allergic to aspirin, protonix [pantoprazole], doxycycline, promethazine-codeine, sulfamethoxazole-trimethoprim, and iodinated contrast media.  MEDICATIONS:  Current Outpatient Medications  Medication Sig Dispense Refill   acetaminophen (TYLENOL) 325 MG tablet Take 2 tablets (650 mg total) by mouth every 6 (six) hours as needed for mild pain (or Fever >/= 101). 12 tablet 0   albuterol (VENTOLIN HFA) 108 (90 Base) MCG/ACT inhaler Inhale 2 puffs into the lungs every 6 (six) hours as needed for wheezing or shortness of breath (cough). 18 g 5   calcium carbonate (TUMS EX) 750 MG chewable tablet Chew by mouth.     chlorpheniramine-HYDROcodone (TUSSIONEX) 10-8 MG/5ML Take 5 mLs by mouth 2 (two) times daily. 115 mL 0   Cholecalciferol 250 MCG (10000 UT) CAPS Take by mouth.     cycloSPORINE (RESTASIS) 0.05 % ophthalmic emulsion Place 1 drop into the right eye.     dexamethasone (DECADRON) 2 MG tablet Take 1 tablet (2 mg total) by mouth daily. 60 tablet 1   Fluticasone-Salmeterol,sensor, (AIRDUO DIGIHALER) 113-14 MCG/ACT AEPB Inhale 1 puff into the lungs 2 (two) times daily. 1 each 4   GARLIC PO Take by mouth.     levalbuterol (XOPENEX) 0.63 MG/3ML nebulizer solution Take 3 mLs (0.63 mg total) by nebulization every 4 (four) hours as needed  for wheezing or shortness of breath. 90 mL 12   lidocaine-prilocaine (EMLA) cream APPLY EXTERNALLY TO THE AFFECTED AREA 1 TIME 30 g 3   Multiple Vitamins-Minerals (EMERGEN-C VITAMIN C PO) Take by mouth.     naproxen sodium (ALEVE) 220 MG tablet Take by mouth.     OIL OF OREGANO PO Take 500 mg by mouth 2 (two) times daily.     ondansetron (ZOFRAN-ODT) 4 MG disintegrating tablet Take 4 mg by mouth every 8 (eight) hours as needed.     SYMBICORT 160-4.5 MCG/ACT inhaler Inhale 2 puffs into the lungs 2 (two) times daily.     Vitamin E (VITAMIN E/D-ALPHA NATURAL) 268 MG (400 UNIT) CAPS Take by mouth.     voriconazole (VFEND) 200 MG tablet Take 1 tablet (200 mg total) by mouth 2 (two) times daily. 60 tablet 11   No current facility-administered medications for this visit.   Facility-Administered Medications Ordered in Other Visits  Medication Dose Route Frequency Provider Last Rate Last Admin   alteplase (CATHFLO ACTIVASE) injection 2 mg  2 mg Intracatheter Once PRN Serena Croissant, MD       sodium chloride flush (NS) 0.9 %  injection 10 mL  10 mL Intracatheter PRN Serena Croissant, MD   10 mL at 07/06/21 1428    PHYSICAL EXAMINATION: ECOG PERFORMANCE STATUS: 1 - Symptomatic but completely ambulatory  Vitals:   04/05/23 1023  BP: 109/67  Pulse: 96  Resp: 18  Temp: (!) 97.2 F (36.2 C)  SpO2: 100%   Filed Weights   04/05/23 1023  Weight: 119 lb 1.6 oz (54 kg)      LABORATORY DATA:  I have reviewed the data as listed    Latest Ref Rng & Units 03/28/2023   10:51 AM 11/29/2022    9:39 AM 05/24/2022   10:31 AM  CMP  Glucose 65 - 99 mg/dL 84  92  56   BUN 7 - 25 mg/dL 9  19  11    Creatinine 0.50 - 1.03 mg/dL 4.09  8.11  9.14   Sodium 135 - 146 mmol/L 136  140  139   Potassium 3.5 - 5.3 mmol/L 4.7  3.6  4.5   Chloride 98 - 110 mmol/L 101  106  104   CO2 20 - 32 mmol/L 24  28  31    Calcium 8.6 - 10.4 mg/dL 9.3  9.1  78.2   Total Protein 6.1 - 8.1 g/dL 6.3  6.5  8.2   Total Bilirubin 0.2  - 1.2 mg/dL 0.2  0.2  0.2   Alkaline Phos 38 - 126 U/L  62  74   AST 10 - 35 U/L 13  13  14    ALT 6 - 29 U/L 13  14  8      Lab Results  Component Value Date   WBC 11.6 (H) 04/05/2023   HGB 9.2 (L) 04/05/2023   HCT 30.3 (L) 04/05/2023   MCV 91.5 04/05/2023   PLT 474 (H) 04/05/2023   NEUTROABS 8.0 (H) 04/05/2023    ASSESSMENT & PLAN:  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  SRS brain: 08/25/2017-09/04/2017 2. Anti Her 2 therapy with Lapatinib started 09/17/2017-05/14/2019: Stopped for progression 3.  I discontinued tamoxifen and started her on letrozole 2.5 mg daily.  05/14/2019 stopped for progression 4.  Stereotactic radiosurgery 12/19/2017 to the new right parietal lobe metastases. 5.  Kadcyla: Received 16 cycles discontinued 05/05/2020 6.  Tucatinib Xeloda: Discontinued because of hand-foot syndrome 06/11/2020-12/15/2020 7. Herceptin/Neratinib: unable to tolerate due to diarrhea and weight loss 8.  SRS to new brain mets 10/01/2021 9.  Resection of [facility] metastases at [facility] 12/29/2022: Metastatic breast cancer proc panel pending 10.  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 Continue with Herceptin maintenance   Brain MRI 02/01/2023: Slight further enlargement of the enhancing mass in the central pons 1.3 cm, less vasogenic edema throughout right cerebral hemisphere improved left occipital lobe, posttreatment changes right frontal lobe   Brain tumor board recommendation: Added Avastin 10 mg/kg every 3 weeks 02/21/2023.  She gets echocardiograms every 6 months.   Return to clinic every 3 weeks for treatments.  She has a brain MRI coming up next month with Dr. Barbaraann Cao -------------------------------------    Insomnia Difficulty sleeping at night, despite cessation of steroids. Attempts to compensate with daytime naps. -Encouraged to maintain good sleep hygiene, including avoiding late-night eating and screen time.    Dry Eye Recent onset of right eye discharge, burning, and morning crusting. Seen by ophthalmologist   -Continue prescribed eye drops.  Anemia Stable hemoglobin at 9.2. -Monitor closely, no intervention needed at this time.  No orders of the defined types were placed in this encounter.  The patient has a good understanding of the overall plan. she agrees with it. she will call with any problems that may develop before the next visit here. Total time spent: 30 mins including face to face time and time spent for planning, charting and co-ordination of care   Tamsen Meek, MD 04/05/23

## 2023-04-05 NOTE — Assessment & Plan Note (Addendum)
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  SRS brain: 08/25/2017-09/04/2017 2. Anti Her 2 therapy with Lapatinib started 09/17/2017-05/14/2019: Stopped for progression 3.  I discontinued tamoxifen and started her on letrozole 2.5 mg daily.  05/14/2019 stopped for progression 4.  Stereotactic radiosurgery 12/19/2017 to the new right parietal lobe metastases. 5.  Kadcyla: Received 16 cycles discontinued 05/05/2020 6.  Tucatinib Xeloda: Discontinued because of hand-foot syndrome 06/11/2020-12/15/2020 7. Herceptin/Neratinib: unable to tolerate due to diarrhea and weight loss 8.  SRS to new brain mets 10/01/2021 9.  Resection of brain metastases at [facility] 12/29/2022: Metastatic breast cancer proc panel pending 10.  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 Continue with Herceptin maintenance   Brain MRI 02/01/2023: Slight further enlargement of the enhancing mass in the central pons 1.3 cm, less vasogenic edema throughout right cerebral hemisphere improved left occipital lobe, posttreatment changes right frontal lobe   Brain tumor board recommendation: Added Avastin 10 mg/kg every 3 weeks 02/21/2023.  She gets echocardiograms every 6 months.   Return to clinic every 3 weeks for treatments.  She has a brain MRI coming up next month with Dr. Barbaraann Cao

## 2023-04-06 ENCOUNTER — Other Ambulatory Visit: Payer: Self-pay | Admitting: Hematology and Oncology

## 2023-04-06 MED ORDER — HYDROCOD POLI-CHLORPHE POLI ER 10-8 MG/5ML PO SUER: 5.0000 mL | Freq: Two times a day (BID) | ORAL | 0 refills | Status: DC

## 2023-04-09 ENCOUNTER — Encounter (HOSPITAL_COMMUNITY): Payer: Self-pay

## 2023-04-11 ENCOUNTER — Ambulatory Visit: Payer: Commercial Managed Care - HMO | Admitting: Pulmonary Disease

## 2023-04-11 ENCOUNTER — Encounter: Payer: Self-pay | Admitting: Pulmonary Disease

## 2023-04-11 VITALS — BP 110/70 | HR 89 | Ht 62.0 in | Wt 116.0 lb

## 2023-04-11 DIAGNOSIS — J479 Bronchiectasis, uncomplicated: Secondary | ICD-10-CM | POA: Diagnosis not present

## 2023-04-11 DIAGNOSIS — B441 Other pulmonary aspergillosis: Secondary | ICD-10-CM

## 2023-04-11 MED ORDER — LEVOFLOXACIN 750 MG PO TABS: 750.0000 mg | ORAL_TABLET | Freq: Every day | ORAL | 0 refills | Status: AC

## 2023-04-11 NOTE — Patient Instructions (Addendum)
It is nice to meet you  For the worsening cough including coughing up blood, take levofloxacin 1 tablet once a day for 14 days.  I wonder if bacteria is built up and causing some of the bleeding and coughing and the antibiotic may help  If coughing up blood is vomited for at least 2 days, please resume the saline nebulized twice a day and consider purchasing a flutter valve and using this 30 inhalations at least twice a day, use up to 4 times a day as able.  The name of the flutter valve was called "Acapella flutter valve"  I will message Dr. Pamelia Hoit and Dr. Daiva Eves  Please send me a message next week and let me know if things are not getting better  Return to clinic in 3 months or sooner as needed with Dr. Judeth Horn

## 2023-04-11 NOTE — Progress Notes (Signed)
@Patient  ID: Melissa [name], female    DOB: 1965-08-07, 57 y.o.   MRN: [id number]  Chief Complaint  Patient presents with   Consult    Pt is here for Consult visit. Pt states she has been coughing up blood for 3 weeks. Pt went to the ER 04-09-2023 and a CT was performed.    Referring provider: Thana Ates, MD  HPI:   57 y.o. 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  Multiple infectious disease notes reviewed.  Multiple prior pulmonary notes reviewed.  Multiple hematology/oncology notes reviewed.  Patient last in clinic in 2022.  Lost to follow-up.  Has had serial CT scans given her history of breast cancer.  For several years has demonstrated significant bronchiectatic changes, cavitary lesion that looks like a very large dilated bronchus on my review interpretation, relatively severe bronchiectasis on the left, and scattered emphysematous changes with waxing and waning air-fluid levels in the cavities.  Recently it was discovered she had intracranial metastasis.  She is undergone radiation therapy.  She started on a Avastin about 9 weeks ago, has received 3 infusions.  She states cough worsened shortly after initiation of a Avastin.  Over the last few weeks she has had hemoptysis.  In the past she has had hemoptysis come and go.  But is pretty persistent over the last several weeks.  This prompted visit to the ED in [place] via [facility] system 04/09/2023 CT scan at that date is interpreted as infiltrate near prior cavitary lesion concerning for progression of aspergillosis.  I cannot review these images.  She endorses weight loss.  Some chills.  Recent voriconazole level seems low but I am not quite sure how best  to interpret it.    Questionaires / Pulmonary Flowsheets:   ACT:      No data to display          MMRC: mMRC Dyspnea Scale mMRC Score  03/05/2020  3:06 PM 1    Epworth:      No data to display          Tests:   FENO:  No results found for: "NITRICOXIDE"  PFT:    Latest Ref Rng & Units 08/09/2016    9:43 AM  PFT Results  FVC-Pre L 2.79   FVC-Predicted Pre % 83   FVC-Post L 2.92   FVC-Predicted Post % 87   Pre FEV1/FVC % % 69   Post FEV1/FCV % % 70   FEV1-Pre L 1.94   FEV1-Predicted Pre % 73   FEV1-Post L 2.05   DLCO uncorrected ml/min/mmHg 10.08   DLCO UNC% % 46   DLVA Predicted % 55   TLC L 4.75   TLC % Predicted % 100   RV % Predicted % 124   Personally reviewed interpreted as mild COPD, no significant bronchodilator response, lung volumes consistent with air trapping, DLCO severely reduced  WALK:     05/02/2018    3:51 PM  SIX MIN WALK  Supplimental Oxygen during Test? (L/min) No  Tech Comments: Fast paced with some SOB noted.    Imaging: Personally reviewed and as per EMR discussion this note No results found.  Lab Results: Personally reviewed CBC    Component Value Date/Time  WBC 11.6 (H) 04/05/2023 0958   WBC 11.4 (H) 03/28/2023 1051   RBC 3.31 (L) 04/05/2023 0958   HGB 9.2 (L) 04/05/2023 0958   HGB 13.1 08/12/2014 0955   HCT 30.3 (L) 04/05/2023 0958   HCT 40.4 08/12/2014 0955   PLT 474 (H) 04/05/2023 0958   PLT 228 08/12/2014 0955   MCV 91.5 04/05/2023 0958   MCV 98.2 08/12/2014 0955   MCH 27.8 04/05/2023 0958   MCHC 30.4 04/05/2023 0958   RDW 19.5 (H) 04/05/2023 0958   RDW 13.0 08/12/2014 0955   LYMPHSABS 1.8 04/05/2023 0958   LYMPHSABS 2.0 08/12/2014 0955   MONOABS 0.9 04/05/2023 0958   MONOABS 0.5 08/12/2014 0955   EOSABS 0.8 (H) 04/05/2023 0958   EOSABS 0.2 08/12/2014 0955   BASOSABS 0.1 04/05/2023 0958   BASOSABS 0.0 08/12/2014 0955    BMET    Component Value Date/Time   NA 136 03/28/2023 1051   NA 142  08/12/2014 0956   K 4.7 03/28/2023 1051   K 4.6 08/12/2014 0956   CL 101 03/28/2023 1051   CL 100 12/20/2012 1509   CO2 24 03/28/2023 1051   CO2 26 08/12/2014 0956   GLUCOSE 84 03/28/2023 1051   GLUCOSE 94 08/12/2014 0956   GLUCOSE 98 12/20/2012 1509   BUN 9 03/28/2023 1051   BUN 10.1 08/12/2014 0956   CREATININE 0.83 03/28/2023 1051   CREATININE 0.9 08/12/2014 0956   CALCIUM 9.3 03/28/2023 1051   CALCIUM 9.3 08/12/2014 0956   GFRNONAA >60 11/29/2022 0939   GFRNONAA 76 03/11/2019 1057   GFRAA >60 03/24/2020 1012   GFRAA >60 02/11/2020 0836   GFRAA 88 03/11/2019 1057    BNP No results found for: "BNP"  ProBNP No results found for: "PROBNP"  Specialty Problems       Pulmonary Problems   Obstructive bronchiectasis (HCC)    Status post left upper lobectomy for ? [place] 1997 CT chest   01/26/2011 Focal bronchiectasis / scarring medially in the left lung apex with  surrounding inflammatory changes  - Pneumovax 08/12/11  And prevnar 08/09/2016  - PFT's  08/09/2016  FEV1 2.05 (77 % ) ratio 70  p 5 % improvement from saba p dulera 100 x2  prior to study with DLCO  46 % corrects to 55  % for alv volume   - alpha one screening 03/27/2018  MM 267  - FOB/BAL 05/23/18 Pos Aspergillus > ID following -  12/13/2018  After extensive coaching inhaler device,  effectiveness =    75% (short ti)        Upper airway cough syndrome    Max rx for gerd trial basis 08/09/2016 >>>         HCAP (healthcare-associated pneumonia)    New density November 05 2016 where prev there was a cystic lesion > rx levaquin with marked improvement cxr 11/25/2016  > f/u cxr 06/16/2017 no def pna        CAP (community acquired pneumonia)    See cxr 11/22/2017 LLL > rx augmentin x 10 days       Emphysema lung (HCC)   Cavitary lesion of lung in area of previous cyts in apex of Sup Segment of LLL    New since CT 01/18/18  assoc with acute onset L axillary cp 03/28/18 in area of previous blebs s a/f level c/w infected  bleb rx augmentin x 10 days > improved at ov 04/11/2018 and 10 more days augmentin given - Quant TB  03/27/2018 neg  - ID eval 04/09/18 fungal serologies sent > neg  - 04/11/2018 rec another 10 days augmentin for ? Abscess forming LUL  - Following with ID ? Start fluconazole if not improving. Req records be sent to MD who treated [place] in '97   - CT 04/26/18 > Overall, there has been interval worsening in lung disease on the Left  - FOB Dr Regenia Skeeter 05/24/19 with BAL pos for aspergillus > see ID rx          DOE (dyspnea on exertion)    05/02/2018  Walked RA x 3 laps @ 185 ft each stopped due to  End of study, fast pace, no   desat - min sob         Pneumonia   Chronic pulmonary aspergillosis (HCC)   Pleural effusion    Allergies  Allergen Reactions   Aspirin Anaphylaxis and Shortness Of Breath    THROAT CLOSES   Protonix [Pantoprazole] Nausea Only and Other (See Comments)    Also caused a "film in the mouth" and caused chest pressure   Doxycycline Other (See Comments)    Hallucinations, headaches   Promethazine-Codeine Cough    Worsening cough and insomnia    Sulfamethoxazole-Trimethoprim Other (See Comments)   Iodinated Contrast Media Rash    Patient allergic to contrast used in radiation oncology for CT simulation     Immunization History  Administered Date(s) Administered   Influenza Split 05/03/2011   Influenza Whole 03/11/2012   Influenza,inj,Quad PF,6+ Mos 07/29/2017, 04/08/2019, 04/20/2020, 05/24/2021   Influenza-Unspecified 04/10/2022   PNEUMOCOCCAL CONJUGATE-20 08/18/2021   Pneumococcal Conjugate-13 08/09/2016   Pneumococcal-Unspecified 08/12/2011    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   Asthma    Breast cancer (HCC)    Bronchiectasis (HCC)    Bronchiolitis    Cancer (HCC)    breast cancer 2014   Cancer, metastatic to liver (HCC)    2021   Complication of anesthesia    bp  dropped + desat    COPD (chronic obstructive pulmonary disease) (HCC)    Dyspnea    DOE   GERD (gastroesophageal reflux disease)    H/O coccidioidomycosis    was reason for lung lobectomy   Headache(784.0)    due to eye strain or not eating   History of anemia    no current problem   History of asthma    as a child   History of breast cancer 2014   left   History of chemotherapy    finished 07/17/2013   History of hiatal hernia    AGE 39   Hx of radiation therapy 03/25/13-05/06/13   left breast 5000 cGy/25 sessions, left breast boost 1000 cGy/5 sessions   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   Pneumonia    LAST FLARE UP 01/2018   Rash 02/22/2021   Runny nose 07/30/2013   clear drainage   Smoker 05/24/2021   Wears dentures    upper    Tobacco History: Social History   Tobacco Use  Smoking Status Every Day   Current packs/day: 0.30   Average packs/day: 0.3 packs/day for 38.0 years (11.4 ttl pk-yrs)   Types: Cigarettes  Smokeless Tobacco Never  Tobacco Comments   10 cigarettes smoked daily 10/19/20 [facility]    Ready to quit: Not Answered Counseling given: Not Answered Tobacco comments: 10 cigarettes smoked daily 10/19/20 [facility]    Continue  to not smoke  Outpatient Encounter Medications as of 04/11/2023  Medication Sig   acetaminophen (TYLENOL) 325 MG tablet Take 2 tablets (650 mg total) by mouth every 6 (six) hours as needed for mild pain (or Fever >/= 101).   albuterol (VENTOLIN HFA) 108 (90 Base) MCG/ACT inhaler Inhale 2 puffs into the lungs every 6 (six) hours as needed for wheezing or shortness of breath (cough).   calcium carbonate (TUMS EX) 750 MG chewable tablet Chew by mouth.   chlorpheniramine-HYDROcodone (TUSSIONEX) 10-8 MG/5ML Take 5 mLs by mouth 2 (two) times daily.   cycloSPORINE (RESTASIS) 0.05 % ophthalmic emulsion Place 1 drop into the right eye.   Fluticasone-Salmeterol,sensor, (AIRDUO DIGIHALER) 113-14 MCG/ACT AEPB Inhale 1 puff into the lungs 2 (two) times daily.   GARLIC PO  Take by mouth.   levalbuterol (XOPENEX) 0.63 MG/3ML nebulizer solution Take 3 mLs (0.63 mg total) by nebulization every 4 (four) hours as needed for wheezing or shortness of breath.   levofloxacin (LEVAQUIN) 750 MG tablet Take 1 tablet (750 mg total) by mouth daily for 14 days.   lidocaine-prilocaine (EMLA) cream APPLY EXTERNALLY TO THE AFFECTED AREA 1 TIME   Multiple Vitamins-Minerals (EMERGEN-C VITAMIN C PO) Take by mouth.   naproxen sodium (ALEVE) 220 MG tablet Take by mouth.   OIL OF OREGANO PO Take 500 mg by mouth 2 (two) times daily.   ondansetron (ZOFRAN-ODT) 4 MG disintegrating tablet Take 4 mg by mouth every 8 (eight) hours as needed.   SYMBICORT 160-4.5 MCG/ACT inhaler Inhale 2 puffs into the lungs 2 (two) times daily.   Vitamin E (VITAMIN E/D-ALPHA NATURAL) 268 MG (400 UNIT) CAPS Take by mouth.   voriconazole (VFEND) 200 MG tablet Take 1 tablet (200 mg total) by mouth 2 (two) times daily.   [DISCONTINUED] Cholecalciferol 250 MCG (10000 UT) CAPS Take by mouth.   [DISCONTINUED] dexamethasone (DECADRON) 2 MG tablet Take 1 tablet (2 mg total) by mouth daily.   Facility-Administered Encounter Medications as of 04/11/2023  Medication   alteplase (CATHFLO ACTIVASE) injection 2 mg   sodium chloride flush (NS) 0.9 % injection 10 mL     Review of Systems  Review of Systems  No chest pain with exertion.  No orthopnea or PND.  Comprehensive review of systems otherwise negative. Physical Exam  BP 110/70 (BP Location: Right Arm, Cuff Size: Normal)   Pulse 89   Ht 5\' 2"  (1.575 m)   Wt 116 lb (52.6 kg)   LMP 07/25/2012 Comment: pregnancy waiver form signed 01-18-2018  SpO2 94%   BMI 21.22 kg/m   Wt Readings from Last 5 Encounters:  04/11/23 116 lb (52.6 kg)  04/05/23 119 lb 1.6 oz (54 kg)  03/27/23 120 lb (54.4 kg)  03/15/23 118 lb 11.2 oz (53.8 kg)  02/21/23 118 lb 8 oz (53.8 kg)    BMI Readings from Last 5 Encounters:  04/11/23 21.22 kg/m  04/05/23 21.78 kg/m  03/27/23  21.95 kg/m  03/15/23 21.71 kg/m  02/21/23 21.67 kg/m     Physical Exam General: Sitting in chair, no acute distress Eyes: EOMI, no icterus Neck: Supple, no JVP Pulmonary: X-ray wheeze on left, clear on the right, normal work of breathing on room air Cardiovascular warm no edema Abdomen: Nondistended, soft, MSK: No synovitis with no joint effusion Neuro: Normal gait, no weakness Psych: Normal mood, full affect   Assessment & Plan:   Hemoptysis: Worse over the last few weeks.  With onset of worsening cough shortly after initiation of a  Avastin.  Cough is loose is a relatively common side effect in the package insert.  Do wonder about worsening cough related to Avastin and hemoptysis related to architectural disruption due to bronchiectasis with further exacerbation due to the effects of a Avastin.  Recent CT scan indicates worsening infiltrate near prior cavitary lesion, I do wonder if this is just aspirated blood in that area as opposed to worsening infection.  Will treat with 14 days of levofloxacin given her history of haemophilus influenza and Pseudomonas.  Hold off on airway clearance until hemoptysis improves.  Aspergillosis: On chronic voriconazole.  Message sent to infectious disease physician regarding prior treatment and moving forward with sounds like posaconazole.  Discussed with patient resampling in the setting of hemoptysis to get a idea of is related to aspergillosis this been undertreated or now with resistance.  She is leery of any intervention given her current administration of a Avastin and risk of bleeding.  Bronchiectasis: Suspect related to chronic infections.  Prior lobectomy due to fungal infection.  Not doing airway clearance.  Levofloxacin as above.  Hold airway clearance.  If hemoptysis improves for 48 hours plus resume hypertonic saline twice daily and flutter valve.   Return in about 3 months (around 07/12/2023) for f/u Dr. Judeth Horn.   Karren Burly,  MD 04/11/2023   This appointment required 45 minutes of patient care (this includes precharting, chart review, review of results, face-to-face care, etc.).

## 2023-04-12 ENCOUNTER — Other Ambulatory Visit: Payer: Self-pay

## 2023-04-13 ENCOUNTER — Ambulatory Visit
Admission: RE | Admit: 2023-04-13 | Discharge: 2023-04-13 | Disposition: A | Discharge status: Home / Self Care | Payer: Commercial Managed Care - HMO | Source: Ambulatory Visit | Attending: Internal Medicine | Admitting: Internal Medicine

## 2023-04-13 DIAGNOSIS — C7931 Secondary malignant neoplasm of brain: Secondary | ICD-10-CM

## 2023-04-13 MED ORDER — GADOPICLENOL 0.5 MMOL/ML IV SOLN
5.0000 mL | Freq: Once | INTRAVENOUS | Status: AC | PRN
  Administered 2023-04-13: 5 mL via INTRAVENOUS

## 2023-04-13 MED ORDER — SODIUM CHLORIDE 0.9% FLUSH
10.0000 mL | INTRAVENOUS | Status: DC | PRN
  Administered 2023-04-13: 10 mL via INTRAVENOUS

## 2023-04-13 MED ORDER — HEPARIN SOD (PORK) LOCK FLUSH 100 UNIT/ML IV SOLN
500.0000 [IU] | Freq: Once | INTRAVENOUS | Status: AC
  Administered 2023-04-13: 500 [IU] via INTRAVENOUS

## 2023-04-17 ENCOUNTER — Inpatient Hospital Stay: Payer: Managed Care, Other (non HMO)

## 2023-04-17 ENCOUNTER — Inpatient Hospital Stay: Payer: Managed Care, Other (non HMO) | Attending: Medical | Admitting: Internal Medicine

## 2023-04-17 ENCOUNTER — Other Ambulatory Visit: Payer: Self-pay | Admitting: Hematology and Oncology

## 2023-04-17 VITALS — BP 111/66 | HR 97 | Temp 97.7°F | Resp 14 | Ht 62.0 in | Wt 115.7 lb

## 2023-04-17 DIAGNOSIS — C50212 Malignant neoplasm of upper-inner quadrant of left female breast: Secondary | ICD-10-CM | POA: Insufficient documentation

## 2023-04-17 DIAGNOSIS — C7931 Secondary malignant neoplasm of brain: Secondary | ICD-10-CM

## 2023-04-17 DIAGNOSIS — Z170 Estrogen receptor positive status [ER+]: Secondary | ICD-10-CM | POA: Insufficient documentation

## 2023-04-17 MED ORDER — GABAPENTIN 100 MG PO CAPS: 100.0000 mg | ORAL_CAPSULE | Freq: Two times a day (BID) | ORAL | 1 refills | Status: DC

## 2023-04-17 NOTE — Progress Notes (Signed)
Because of hemoptysis, Dr. Barbaraann Cao recommended that I discontinue Avastin.

## 2023-04-17 NOTE — Progress Notes (Signed)
[facility] [address]  [place], [place] [address] [phone]   Interval Evaluation  Date of Service: 04/17/23 Patient Name: Melissa [name] Patient MRN: [id number] Patient DOB: May 06, 1966 Provider: Henreitta Leber, MD  Identifying Statement:  Melissa [name] is a 57 y.o. female with 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 [facility])    Primary Cancer:  Oncologic History: Oncology History  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 Ki-67 was 70% HER-2 was amplified with a ratio 2.91   07/12/2012 - 07/17/2013 Neo-Adjuvant Chemotherapy   TCH 6 followed by Herceptin maintenance   12/11/2012 Surgery   Left breast lumpectomy: 1.8 cm tumor 1 positive sentinel node, axillary lymph node dissection 02/08/2013 showed 0/13 lymph nodes   03/25/2013 - 05/06/2013 Radiation Therapy   Adjuvant radiation therapy   06/05/2013 - 07/20/2017 Anti-estrogen oral therapy   Tamoxifen 20 mg daily   07/27/2017 Relapse/Recurrence   MRI Brain: 3.4 x 2.9 x 2.9 cm RIGHT frontal lobe mass with imaging characteristics of solitary metastasis. Extensive vasogenic edema resulting in 9 mm RIGHT to LEFT midline shift. Equivocal very early LEFT ventricle entrapment.    08/04/2017 Surgery   Rt frontal brain resection: Poorly differentiated tumor IHC suggests breast primary ER and PR Positive   08/25/2017 - 09/04/2017 Radiation Therapy   Stereotactic radiation   09/18/2017 -  Anti-estrogen oral therapy   Lapatinib with letrozole   12/18/2017 - 12/19/2017 Radiation Therapy   New right parietal lobe metastases status post [facility]   08/27/2018 - 08/27/2020 Radiation Therapy   SRS to new brain metastases   05/09/2019 Relapse/Recurrence   Interval increase in size  of the enhancing nodular left internal mammary soft tissue 2.8 cm.  Redemonstrated enlarged supraclavicular, lower cervical and lower posterior cervical nodes unchanged.  Interval increase in the bony erosion of the posterior and lateral left third rib, increasing soft tissue lesion eroding the left sternal body 3.1 cm was 2.5 cm.  Bronchiectatic changes   06/19/2019 - 05/05/2020 Chemotherapy   ado-trastuzumab emtansine (KADCYLA)     06/15/2020 -  Chemotherapy   Xeloda, Tucatinib, Herceptin    04/12/2022 - 09/06/2022 Chemotherapy   Patient is on Treatment Plan : BREAST Trastuzumab IV (8/6) or SQ (600) D1 q21d     09/27/2022 -  Chemotherapy   Patient is on Treatment Plan : BREAST MAINTENANCE Trastuzumab IV (6) or SQ (600) D1 q21d x 13 cycles. Added bevacizumab (10) q21d starting cycle 8.     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 Added bevacizumab (10) q21d starting cycle 8.     Port-A-Cath in place   CNS Oncologic History 02/20/23: SRS to pons recurrence, R temporal rsxn cavity.  Concurrent avastin initiated q3 weeks 12/29/22: Craniotomy, resection R temporal ([facility]).  Path is 70% neoplasm 11/04/22: Post-op frx SRS L occipital Kathrynn Running) 10/06/22: Craniotomy, resection L occipital at [facility]), path is neoplasm 10/12/21: SRS to pons 16 Gy Kathrynn Running) 06/29/21: SRS to 2x L occipital 03/17/21: SRS x3 08/27/20: SRS x5 05/13/20: SRS x4   03/25/19: SRS right frontal 5 mm 09/04/18: SRS x2 right frontal 12/18/17: SRS R parietal 09/04/17: Post op SRS R frontal, 5 fxs 08/04/17: Craniotomy, resection R frontal met ([name])  Interval History: Melissa [name] presents today for follow up after recent MRI brain, now having completed SRS for recurrent brainstem metastasis and right temporal resection cavity.   She describes several weeks history of "coughing up blood, including large clots".  She has been evaluated by pulmonology, etiology is unclear.  Does report an increase in fatigue and lethargy since symptom onset.  Ohterwise no new or progressive neurologic complaints; she continues to experience numbness and tingling in her left hand since surgery in June.  Walking independently, no seizures or headaches.  Prior: She describes some lingering tingling of fingertips in her left hand, but this has actually improved in recent days.  There are still head pain sensations following surgery, described as "lightning bolt" feeling which lasts for 3-5 seconds, occurs 2-3x per hour.  This has not improved with Tylenol or other pain medications.  Otherwise no new or progressive neurologic symptoms, remains functionally intact and independent with gait.  Continues on Kanjinti with Dr. Pamelia Hoit, tolerating it well.  Planning upcoming trip to beach with new camper.   She describes recurrence of daily headaches since surgery.  Has been dosing Tylenol or Aleve daily.  Sleep has been poor at times.  Steroids had been discontinued.  Her right sided visual symptoms have not recurred.  Otherwise no new or progressive changes.  H+P (08/02/22) Patient presents today for headache evaluation.  She describes pain "along the temples" mild to moderate, every day for the past month.  Prior to this she had very sporadic migraine type headaches, which these differ from.  No associated neurologic deficits.  She does dose codeine based cough syrup every night (for the past  year), and has chronic sleep impairment.  Decadron 2mg  twice per day, started last week, has not helped any symptoms.  Also describes some "shadowy visual loss" on the right side, at times.  Continues on herceptin with Dr. Pamelia Hoit.  Medications: Current Outpatient Medications on File Prior to Visit  Medication Sig Dispense Refill   acetaminophen (TYLENOL) 325 MG tablet Take 2 tablets (650 mg total) by mouth every 6 (six) hours as needed for mild pain (or Fever >/= 101). 12 tablet 0   albuterol (VENTOLIN HFA) 108 (90 Base) MCG/ACT inhaler Inhale 2 puffs into the lungs every 6 (six) hours as needed for wheezing or shortness of breath (cough). 18 g 5   calcium carbonate (TUMS EX) 750 MG chewable tablet Chew by mouth.     chlorpheniramine-HYDROcodone (TUSSIONEX) 10-8 MG/5ML Take 5 mLs by mouth 2 (two) times daily. 115 mL 0   cycloSPORINE (RESTASIS) 0.05 % ophthalmic emulsion Place 1 drop into the right eye.     Fluticasone-Salmeterol,sensor, (AIRDUO DIGIHALER) 113-14 MCG/ACT AEPB Inhale 1 puff into the lungs 2 (two) times daily. 1 each 4   GARLIC PO Take by mouth.     levalbuterol (XOPENEX) 0.63 MG/3ML nebulizer solution Take 3 mLs (0.63 mg total) by nebulization every 4 (four) hours as needed for wheezing or shortness  of breath. 90 mL 12   levofloxacin (LEVAQUIN) 750 MG tablet Take 1 tablet (750 mg total) by mouth daily for 14 days. 14 tablet 0   lidocaine-prilocaine (EMLA) cream APPLY EXTERNALLY TO THE AFFECTED AREA 1 TIME 30 g 3   Multiple Vitamins-Minerals (EMERGEN-C VITAMIN C PO) Take by mouth.     naproxen sodium (ALEVE) 220 MG tablet Take by mouth.     OIL OF OREGANO PO Take 500 mg by mouth 2 (two) times daily.     ondansetron (ZOFRAN-ODT) 4 MG disintegrating tablet Take 4 mg by mouth every 8 (eight) hours as needed.     SYMBICORT 160-4.5 MCG/ACT inhaler Inhale 2 puffs into the lungs 2 (two) times daily.     Vitamin E (VITAMIN E/D-ALPHA NATURAL) 268 MG (400 UNIT) CAPS Take by mouth.      voriconazole (VFEND) 200 MG tablet Take 1 tablet (200 mg total) by mouth 2 (two) times daily. 60 tablet 11   Current Facility-Administered Medications on File Prior to Visit  Medication Dose Route Frequency Provider Last Rate Last Admin   alteplase (CATHFLO ACTIVASE) injection 2 mg  2 mg Intracatheter Once PRN Serena Croissant, MD       sodium chloride flush (NS) 0.9 % injection 10 mL  10 mL Intracatheter PRN Serena Croissant, MD   10 mL at 07/06/21 1428    Allergies:  Allergies  Allergen Reactions   Aspirin Anaphylaxis and Shortness Of Breath    THROAT CLOSES   Protonix [Pantoprazole] Nausea Only and Other (See Comments)    Also caused a "film in the mouth" and caused chest pressure   Doxycycline Other (See Comments)    Hallucinations, headaches   Promethazine-Codeine Cough    Worsening cough and insomnia    Sulfamethoxazole-Trimethoprim Other (See Comments)   Iodinated Contrast Media Rash    Patient allergic to contrast used in radiation oncology for CT simulation    Past Medical History:  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   Asthma    Breast cancer (HCC)    Bronchiectasis (HCC)    Bronchiolitis    Cancer (HCC)    breast cancer 2014   Cancer, metastatic to liver (HCC)    2021   Complication of anesthesia    bp dropped + desat    COPD (chronic obstructive pulmonary disease) (HCC)    Dyspnea    DOE   GERD (gastroesophageal reflux disease)    H/O coccidioidomycosis    was reason for lung lobectomy   Headache(784.0)    due to eye strain or not eating   History of anemia    no current problem   History of asthma    as a child   History of breast cancer 2014   left   History of chemotherapy    finished 07/17/2013   History of hiatal hernia    AGE 19   Hx of radiation therapy 03/25/13-05/06/13   left breast 5000 cGy/25 sessions, left breast boost 1000 cGy/5 sessions   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    Pneumonia    LAST FLARE UP 01/2018   Rash 02/22/2021   Runny nose 07/30/2013   clear drainage   Smoker 05/24/2021   Wears dentures    upper   Past Surgical History:  Past Surgical History:  Procedure Laterality Date   APPLICATION OF CRANIAL NAVIGATION N/A 08/04/2017   Procedure: APPLICATION OF CRANIAL NAVIGATION;  Surgeon: [name], Loura Halt, MD;  Location: [facility] OR;  Service: Neurosurgery;  Laterality: N/A;   AXILLARY LYMPH NODE DISSECTION Left 02/08/2013   Procedure: LEFT AXILLARY DISSECTION;  Surgeon: Mariella Saa, MD;  Location: [facility];  Service: General;  Laterality: Left;   BREAST CYST EXCISION Right 2006   BREAST LUMPECTOMY Left 2014   BREAST LUMPECTOMY WITH NEEDLE LOCALIZATION AND AXILLARY SENTINEL LYMPH NODE BX Left 12/31/2012   Procedure: NEEDLE LOCALIZATION LEFT BREAST LUMPECTOMY AND LEFT AXILLARY SENTENIAL LYMPH NODE BX;  Surgeon: Mariella Saa, MD;  Location: [facility];  Service: General;  Laterality: Left;   BRONCHIAL BRUSHINGS  03/17/2020   Procedure: BRONCHIAL BRUSHINGS;  Surgeon: Josephine Igo, DO;  Location: [facility];  Service: Pulmonary;;   BRONCHIAL WASHINGS  03/17/2020   Procedure: BRONCHIAL WASHINGS;  Surgeon: Josephine Igo, DO;  Location: [facility];  Service: Pulmonary;;   CESAREAN SECTION  1995/1996   CRANIOTOMY Right 08/04/2017   Procedure: Right Frontal craniotomy for resection of tumor with stereotactic navigation;  Surgeon: [name], Loura Halt, MD;  Location[facility] OR;  Service: Neurosurgery;  Laterality: Right;  Right Frontal craniotomy for resection of tumor with stereotactic navigation   IR CV LINE INJECTION  02/19/2021   IR CV LINE INJECTION  11/12/2021   IR IMAGING GUIDED PORT INSERTION  05/31/2019   LUNG LOBECTOMY Left 05/1996   upper lobe - due to [facility] Fever   PORT-A-CATH REMOVAL Right 08/02/2013   Procedure: REMOVAL PORT-A-CATH;  Surgeon: Mariella Saa, MD;  Location: [facility];   Service: General;  Laterality: Right;   PORTACATH PLACEMENT  07/02/2012   Procedure: INSERTION PORT-A-CATH;  Surgeon: Mariella Saa, MD;  Location: [facility];  Service: General;  Laterality: N/A;  right   VIDEO BRONCHOSCOPY N/A 03/17/2020   Procedure: VIDEO BRONCHOSCOPY;  Surgeon: Josephine Igo, DO;  Location: [facility];  Service: Pulmonary;  Laterality: N/A;   VIDEO BRONCHOSCOPY WITH ENDOBRONCHIAL ULTRASOUND N/A 05/23/2018   Procedure: VIDEO BRONCHOSCOPY WITH ENDOBRONCHIAL ULTRASOUND;  Surgeon: Josephine Igo, DO;  Location: [facility] OR;  Service: Thoracic;  Laterality: N/A;   Social History:  Social History   Socioeconomic History   Marital status: Married    Spouse name: Not on file   Number of children: 2   Years of education: Not on file   Highest education level: Not on file  Occupational History   Occupation: Investment banker, corporate  Tobacco Use   Smoking status: Every Day    Current packs/day: 0.30    Average packs/day: 0.3 packs/day for 38.0 years (11.4 ttl pk-yrs)    Types: Cigarettes   Smokeless tobacco: Never   Tobacco comments:    10 cigarettes smoked daily 10/19/20 [facility]   Vaping Use   Vaping status: Never Used  Substance and Sexual Activity   Alcohol use: No   Drug use: No   Sexual activity: Not Currently  Other Topics Concern   Not on file  Social History Narrative   Not on file   Social Determinants of Health   Financial Resource Strain: Low Risk  (01/03/2023)   Received from [facility]   Overall Financial Resource Strain (CARDIA)    Difficulty of Paying Living Expenses: Not hard at all  Food Insecurity: No Food Insecurity (01/30/2023)   Hunger Vital Sign    Worried About Running Out of Food in the Last Year: Never true    Ran Out  of Food in the Last Year: Never true  Transportation Needs: No Transportation Needs (01/30/2023)   PRAPARE - Administrator, Civil Service (Medical): No     Lack of Transportation (Non-Medical): No  Physical Activity: Not on file  Stress: Not on file  Social Connections: Not on file  Intimate Partner Violence: Not At Risk (01/30/2023)   Humiliation, Afraid, Rape, and Kick questionnaire    Fear of Current or Ex-Partner: No    Emotionally Abused: No    Physically Abused: No    Sexually Abused: No   Family History:  Family History  Problem Relation Age of Onset   Emphysema Mother        was a smoker   Heart disease Mother    Melanoma Mother        dx in her 5s   Breast cancer Mother 77   Asthma Brother    Breast cancer Cousin        mother's maternal cousin; dx in her 32s    Review of Systems: Constitutional: Doesn't report fevers, chills or abnormal weight loss Eyes: Doesn't report blurriness of vision Ears, nose, mouth, throat, and face: Doesn't report sore throat Respiratory: Doesn't report cough, dyspnea or wheezes Cardiovascular: Doesn't report palpitation, chest discomfort  Gastrointestinal:  Doesn't report nausea, constipation, diarrhea GU: Doesn't report incontinence Skin: Doesn't report skin rashes Neurological: Per HPI Musculoskeletal: Doesn't report joint pain Behavioral/Psych: Doesn't report anxiety  Physical Exam: Vitals:   04/17/23 0913  BP: 111/66  Pulse: 97  Resp: 14  Temp: 97.7 F (36.5 C)  SpO2: 98%   KPS: 80. General: Alert, cooperative, pleasant, in no acute distress Head: Normal EENT: No conjunctival injection or scleral icterus.  Lungs: Resp effort normal Cardiac: Regular rate Abdomen: Non-distended abdomen Skin: No rashes cyanosis or petechiae. Extremities: No clubbing or edema  Neurologic Exam: Mental Status: Awake, alert, attentive to examiner. Oriented to self and environment. Language is fluent with intact comprehension.  Cranial Nerves: Visual acuity is grossly normal. Visual fields are full. Extra-ocular movements intact. No ptosis. Face is symmetric Motor: Tone and bulk are  normal. Power is full in both arms and legs. Reflexes are symmetric, no pathologic reflexes present.  Sensory: Intact to light touch Gait: Normal.   Labs: I have reviewed the data as listed    Component Value Date/Time   NA 136 03/28/2023 1051   NA 142 08/12/2014 0956   K 4.7 03/28/2023 1051   K 4.6 08/12/2014 0956   CL 101 03/28/2023 1051   CL 100 12/20/2012 1509   CO2 24 03/28/2023 1051   CO2 26 08/12/2014 0956   GLUCOSE 84 03/28/2023 1051   GLUCOSE 94 08/12/2014 0956   GLUCOSE 98 12/20/2012 1509   BUN 9 03/28/2023 1051   BUN 10.1 08/12/2014 0956   CREATININE 0.83 03/28/2023 1051   CREATININE 0.9 08/12/2014 0956   CALCIUM 9.3 03/28/2023 1051   CALCIUM 9.3 08/12/2014 0956   PROT 6.3 03/28/2023 1051   PROT 7.1 08/12/2014 0956   ALBUMIN 3.6 11/29/2022 0939   ALBUMIN 3.8 08/12/2014 0956   AST 13 03/28/2023 1051   AST 13 (L) 11/29/2022 0939   AST 15 08/12/2014 0956   ALT 13 03/28/2023 1051   ALT 14 11/29/2022 0939   ALT 11 08/12/2014 0956   ALKPHOS 62 11/29/2022 0939   ALKPHOS 68 08/12/2014 0956   BILITOT 0.2 03/28/2023 1051   BILITOT 0.2 (L) 11/29/2022 0939   BILITOT 0.25 08/12/2014 0272  GFRNONAA >60 11/29/2022 0939   GFRNONAA 76 03/11/2019 1057   GFRAA >60 03/24/2020 1012   GFRAA >60 02/11/2020 0836   GFRAA 88 03/11/2019 1057   Lab Results  Component Value Date   WBC 11.6 (H) 04/05/2023   NEUTROABS 8.0 (H) 04/05/2023   HGB 9.2 (L) 04/05/2023   HCT 30.3 (L) 04/05/2023   MCV 91.5 04/05/2023   PLT 474 (H) 04/05/2023    Imaging:  MR BRAIN W WO CONTRAST  Result Date: 04/14/2023 CLINICAL DATA:  Brain/CNS neoplasm, assess treatment response. New chemo treatment x2 months. EXAM: MRI HEAD WITHOUT AND WITH CONTRAST TECHNIQUE: Multiplanar, multiecho pulse sequences of the brain and surrounding structures were obtained without and with intravenous contrast. CONTRAST:  5 mL Vueway. Contrast was administered via a port which was accessed by a registered nurse.  COMPARISON:  MRI brain 02/01/2023. FINDINGS: BRAIN New Lesions: None. Larger lesions: None. Stable or Smaller lesions: Decreased size of the peripherally enhancing lesion along the right superior frontal gyrus, now with 4 mm residual nodular enhancement along the anterior margin (axial image 146 series 14). Decreased nodular enhancement along the medial and posterior margins of the anterior right frontal lobe resection cavity (axial image 127 series 14). Decreased size and enhancement of the peripherally enhancing lesion along the right medial orbital frontal gyrus, now measuring up to 6 mm (axial image 99 series 14), previously 11 mm. Unchanged minimal enhancement along the superior and inferior margins of the left occipital lobe resection cavity. Decreased size and nodular enhancement of the margins of the right temporal lobe resection cavity (axial image 71 series 14). Near-complete resolution of enhancement along the superior aspect of the cerebellar vermis (axial image 72 series 14). Decreased size of the peripherally enhancing lesion in the central pons, now measuring up to 9 mm (axial image 61 series 14), previously 13 mm. The previously questioned faint focus of enhancement in the inferior aspect of the left cerebellar hemisphere is not seen on today's exam. Other Brain findings: No acute infarct or hemorrhage. No hydrocephalus or midline shift. Vascular: Normal flow voids and vessel enhancement. Skull and upper cervical spine: Prior left occipital, right temporal, and right frontal craniotomies. Sinuses/Orbits: No acute findings. Other: Unchanged small pseudomeningocele overlying the right temporal craniotomy flap. IMPRESSION: 1. Decreased size and enhancement of the peripherally enhancing lesions along the right superior frontal gyrus, right medial orbital frontal gyrus, right temporal lobe resection cavity, central pons, and superior cerebellar vermis, consistent with treatment response. 2. No new  lesions. Electronically Signed   By: Orvan Falconer M.D.   On: 04/14/2023 09:31    [facility] Clinician Interpretation: I have personally reviewed the radiological images as listed.  My interpretation, in the context of the patient's clinical presentation, is stable disease   Assessment/Plan 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 [facility])  Melissa [name] is clinically stable today from focal neurologic standpiont.  MRI brain demonstrates reduction in volume of enhancement and T2 signal within recently treated foci, basis pontis and right temporal cavity.  She has completed 3 cycles of avastin 10mg /kg concurrent with her Kanjinti.  Though she remains at high risk for post-radiation inflammatory change, radiation necrosis, we recommended stopping further avastin given her hemoptysis.  We recommended nocturnal humidification and allowing avastin to wash-out in 2 weeks.  She will continue to follow up with pulmonology team as well for this.  Can utilize corticosteroids if focal deficits develop from post-RT inflammation.  Will remain off decadron for now.  We appreciate the opportunity  to participate in the care of Melissa [name].   We ask that Melissa [name] return to clinic in 2 months following next MRI brain, or sooner if needed.  All questions were answered. The patient knows to call the clinic with any problems, questions or concerns. No barriers to learning were detected.  The total time spent in the encounter was 40 minutes and more than 50% was on counseling and review of test results   Henreitta Leber, MD Medical Director of Neuro-Oncology [facility] 04/17/23 9:12 AM

## 2023-04-25 ENCOUNTER — Inpatient Hospital Stay: Payer: Managed Care, Other (non HMO)

## 2023-04-25 ENCOUNTER — Inpatient Hospital Stay: Payer: Managed Care, Other (non HMO) | Attending: Hematology and Oncology

## 2023-04-25 VITALS — BP 120/67 | HR 90 | Temp 97.9°F | Resp 16

## 2023-04-25 DIAGNOSIS — Z170 Estrogen receptor positive status [ER+]: Secondary | ICD-10-CM | POA: Insufficient documentation

## 2023-04-25 DIAGNOSIS — C7931 Secondary malignant neoplasm of brain: Secondary | ICD-10-CM | POA: Insufficient documentation

## 2023-04-25 DIAGNOSIS — C7951 Secondary malignant neoplasm of bone: Secondary | ICD-10-CM | POA: Insufficient documentation

## 2023-04-25 DIAGNOSIS — C787 Secondary malignant neoplasm of liver and intrahepatic bile duct: Secondary | ICD-10-CM | POA: Diagnosis not present

## 2023-04-25 DIAGNOSIS — C50912 Malignant neoplasm of unspecified site of left female breast: Secondary | ICD-10-CM

## 2023-04-25 DIAGNOSIS — Z171 Estrogen receptor negative status [ER-]: Secondary | ICD-10-CM

## 2023-04-25 DIAGNOSIS — Z5112 Encounter for antineoplastic immunotherapy: Secondary | ICD-10-CM | POA: Diagnosis present

## 2023-04-25 DIAGNOSIS — Z95828 Presence of other vascular implants and grafts: Secondary | ICD-10-CM

## 2023-04-25 DIAGNOSIS — C50212 Malignant neoplasm of upper-inner quadrant of left female breast: Secondary | ICD-10-CM | POA: Insufficient documentation

## 2023-04-25 LAB — CBC WITH DIFFERENTIAL (CANCER CENTER ONLY)
Abs Immature Granulocytes: 0.04 10*3/uL (ref 0.00–0.07)
Basophils Absolute: 0.1 10*3/uL (ref 0.0–0.1)
Basophils Relative: 1 %
Eosinophils Absolute: 0.8 10*3/uL — ABNORMAL HIGH (ref 0.0–0.5)
Eosinophils Relative: 6 %
HCT: 29.3 % — ABNORMAL LOW (ref 36.0–46.0)
Hemoglobin: 9.2 g/dL — ABNORMAL LOW (ref 12.0–15.0)
Immature Granulocytes: 0 %
Lymphocytes Relative: 17 %
Lymphs Abs: 2.1 10*3/uL (ref 0.7–4.0)
MCH: 27.4 pg (ref 26.0–34.0)
MCHC: 31.4 g/dL (ref 30.0–36.0)
MCV: 87.2 fL (ref 80.0–100.0)
Monocytes Absolute: 0.8 10*3/uL (ref 0.1–1.0)
Monocytes Relative: 7 %
Neutro Abs: 8.2 10*3/uL — ABNORMAL HIGH (ref 1.7–7.7)
Neutrophils Relative %: 69 %
Platelet Count: 560 10*3/uL — ABNORMAL HIGH (ref 150–400)
RBC: 3.36 MIL/uL — ABNORMAL LOW (ref 3.87–5.11)
RDW: 19.5 % — ABNORMAL HIGH (ref 11.5–15.5)
WBC Count: 12 10*3/uL — ABNORMAL HIGH (ref 4.0–10.5)
nRBC: 0 % (ref 0.0–0.2)

## 2023-04-25 MED ORDER — ALTEPLASE 2 MG IJ SOLR
2.0000 mg | Freq: Once | INTRAMUSCULAR | Status: AC
  Administered 2023-04-25: 2 mg
  Filled 2023-04-25: qty 2

## 2023-04-25 MED ORDER — HEPARIN SOD (PORK) LOCK FLUSH 100 UNIT/ML IV SOLN
500.0000 [IU] | Freq: Once | INTRAVENOUS | Status: AC | PRN
  Administered 2023-04-25: 500 [IU]

## 2023-04-25 MED ORDER — SODIUM CHLORIDE 0.9% FLUSH: 10.0000 mL | INTRAVENOUS | Status: DC | PRN

## 2023-04-25 MED ORDER — TRASTUZUMAB-ANNS CHEMO 150 MG IV SOLR
6.0000 mg/kg | Freq: Once | INTRAVENOUS | Status: AC
  Administered 2023-04-25: 300 mg via INTRAVENOUS
  Filled 2023-04-25: qty 14.29

## 2023-04-25 MED ORDER — SODIUM CHLORIDE 0.9 % IV SOLN: Freq: Once | INTRAVENOUS | Status: AC

## 2023-04-25 MED ORDER — ACETAMINOPHEN 325 MG PO TABS
650.0000 mg | ORAL_TABLET | Freq: Once | ORAL | Status: AC
  Administered 2023-04-25: 650 mg via ORAL
  Filled 2023-04-25: qty 2

## 2023-04-25 MED ORDER — DIPHENHYDRAMINE HCL 25 MG PO CAPS
25.0000 mg | ORAL_CAPSULE | Freq: Once | ORAL | Status: AC
  Administered 2023-04-25: 25 mg via ORAL
  Filled 2023-04-25: qty 1

## 2023-04-26 ENCOUNTER — Encounter: Payer: Self-pay | Admitting: Hematology and Oncology

## 2023-04-26 ENCOUNTER — Encounter: Payer: Self-pay | Admitting: Internal Medicine

## 2023-04-26 ENCOUNTER — Encounter: Payer: Self-pay | Admitting: *Deleted

## 2023-04-26 NOTE — Telephone Encounter (Signed)
TC

## 2023-04-27 ENCOUNTER — Encounter: Payer: Self-pay | Admitting: *Deleted

## 2023-04-27 ENCOUNTER — Other Ambulatory Visit: Payer: Self-pay | Admitting: Hematology and Oncology

## 2023-04-27 DIAGNOSIS — Z1231 Encounter for screening mammogram for malignant neoplasm of breast: Secondary | ICD-10-CM

## 2023-05-01 ENCOUNTER — Encounter: Payer: Self-pay | Admitting: *Deleted

## 2023-05-01 NOTE — Progress Notes (Unsigned)
Prepared and faxed letter to [facility] for patient to [phone] to give approval for patient to have dental extractions after 10/23/024 (wash out period post Avastin treatments).

## 2023-05-02 ENCOUNTER — Other Ambulatory Visit: Payer: Self-pay | Admitting: Hematology and Oncology

## 2023-05-04 ENCOUNTER — Encounter: Payer: Self-pay | Admitting: Hematology and Oncology

## 2023-05-06 ENCOUNTER — Other Ambulatory Visit: Payer: Self-pay | Admitting: Hematology and Oncology

## 2023-05-08 ENCOUNTER — Other Ambulatory Visit: Payer: Self-pay

## 2023-05-08 ENCOUNTER — Encounter: Payer: Self-pay | Admitting: Infectious Disease

## 2023-05-08 ENCOUNTER — Ambulatory Visit (INDEPENDENT_AMBULATORY_CARE_PROVIDER_SITE_OTHER): Payer: Managed Care, Other (non HMO) | Admitting: Infectious Disease

## 2023-05-08 ENCOUNTER — Other Ambulatory Visit (HOSPITAL_COMMUNITY): Payer: Self-pay

## 2023-05-08 ENCOUNTER — Encounter: Payer: Self-pay | Admitting: Hematology and Oncology

## 2023-05-08 VITALS — BP 126/78 | HR 102 | Resp 16 | Ht 62.0 in | Wt 118.4 lb

## 2023-05-08 DIAGNOSIS — R042 Hemoptysis: Secondary | ICD-10-CM

## 2023-05-08 DIAGNOSIS — Z7185 Encounter for immunization safety counseling: Secondary | ICD-10-CM

## 2023-05-08 DIAGNOSIS — Z23 Encounter for immunization: Secondary | ICD-10-CM

## 2023-05-08 DIAGNOSIS — F17210 Nicotine dependence, cigarettes, uncomplicated: Secondary | ICD-10-CM | POA: Diagnosis not present

## 2023-05-08 DIAGNOSIS — Z171 Estrogen receptor negative status [ER-]: Secondary | ICD-10-CM

## 2023-05-08 DIAGNOSIS — B449 Aspergillosis, unspecified: Secondary | ICD-10-CM | POA: Diagnosis not present

## 2023-05-08 DIAGNOSIS — C50912 Malignant neoplasm of unspecified site of left female breast: Secondary | ICD-10-CM

## 2023-05-08 DIAGNOSIS — F17200 Nicotine dependence, unspecified, uncomplicated: Secondary | ICD-10-CM

## 2023-05-08 HISTORY — DX: Hemoptysis: R04.2

## 2023-05-08 MED ORDER — HYDROCOD POLI-CHLORPHE POLI ER 10-8 MG/5ML PO SUER: 5.0000 mL | Freq: Two times a day (BID) | ORAL | 0 refills | Status: DC

## 2023-05-08 MED ORDER — CRESEMBA 186 MG PO CAPS
ORAL_CAPSULE | ORAL | 11 refills | Status: AC
Start: 2023-05-08 — End: 2023-06-24
  Filled 2023-05-08: qty 42, 10d supply, fill #0
  Filled 2023-05-29: qty 42, 21d supply, fill #1

## 2023-05-08 NOTE — Progress Notes (Signed)
Subjective:  Chief complaint follow-up for mycetoma   Patient ID: Melissa [name], female    DOB: 28-Nov-1965, 57 y.o.   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  There was an AFB culture positive for an organism that would not grow on culture at [facility].  Interim history:  "We had been treating her with Cresemba and then voricaonzole  She  underwent stereotactic radiation to these lesions by Dr. Linzie Collin   Her chemotherapeutic regimen previously included Tukysa (tucatinib) which can elevate voriconazole levels and potentially cause voriconazole toxicity including QT prolongation.   We checjked her voriconazole levels and they have been in the therapeutic range recently   EKG showed:  her EKG showed a QTC of 448 with a QTC of 465 ms last EKG that I could find in the computer that I could actually read was in 2019 in which she had a QTC of 442.     She did tell us that she is coughing less since being on voriconazole.   .   She had MRI of the brain done this February 2022 showed decrease in size of 3 lesions but multiple new lesions.   Her regimen was Xeloda Herceptin and Tucatinib   I last saw her she was developing a rash on her hands which worsened as well as on her lips.   Ultimately her oncologist Dr. Pamelia Hoit stopped her Xeloda and Tucatinib and she is on  Herceptin alone.    In the interim she then had CT chest abdomen pelvis.  From an infectious disease standpoint there is increase in her cavitation which is not altogether surprising and there were areas of increased nodularity and specially in the left lower lung.   She was bothered by this and sought second opinion at [facility] and see by Dr. Kirstie Peri who carefully reviewed her case and felt that there was no need for further aggressive inventions to work this up nor need to change antifungal therapy.   She also had an MRI of the brain that showed new lesions.  She was seen by Stereotactic Radiosurgery to 3 lesions on March 17 2021.   She started new chemotherapy in the form of neratinib whose drug concentrations are increased by voriconazole.  However Dr Pamelia Hoit started at a lower dose which they have tried to escalate.  She told me she took 3 pills without difficulty but when she went to 5 pills she had severe diarrhea and had to be given IV fluids for rehydration.     She is now off of neratinib but still on herceptin   He did again have stereotactic surgery for brain metastasis with Dr. Jake Samples in December 2000 2022   She had a CT of the chest performed in January 2023:   This did not show PE as it was a CT angiogram did show increased opacity in left  lung base with some minimal effusion with multiple serpiginous cystic lesions that were stable.  Also extensive emphysematous changes seen.   Shortly after getting her CT scan she developed neck pain chest pain which she seemed to be pleurisy to her and was ultimately mid to the hospital and treated for pneumonia with IV antibiotics followed by doxycycline and cefdinir.  She does not tolerate these antibiotics particularly doxycycline.  She saw my partner Dr. Elinor Parkinson in clinic while she is off antibiotics and she gave  Augmentin to finish a course of antibiotics.  Kaithlyn has undergone repeat SRS to brain metastases  in March of 2023 and remains on herceptin alone.   Her coughing had improved dramatically since I last saw her she says she is smoking less than half a pack of cigarettes sometimes only 10/day now.  She is taking her voriconazole religiously.  Her husband  also had quit smoking altogether.   [name] is being followed by Dr. Pamelia Hoit and is on herceptin.    She iwas also taking fenbendazole (antiheminthic for canines) and multiple other suppllements in hopes it will help her vs her cancer.    CT chest abdomen pelvis performed in December 2023 showed the following:   CT CHEST IMPRESSION   1.  No acute process or evidence of metastatic disease in the chest. 2. Left upper lobectomy. Primarily similar appearance of left lower lobe areas of presumed marked cystic bronchiectasis. Areas of nodular consolidation, some of which are minimally progressive, are consistent with acute on chronic atypical infection. 3. Aortic atherosclerosis (ICD10-I70.0) and emphysema (ICD10-J43.9).   CT ABDOMEN AND PELVIS IMPRESSION   1. Further decrease in size of a right hepatic lobe treated metastasis. 2. No new or progressive disease.   MRI brain in January showed:    IMPRESSION: 1. Enhancing lesions within the anterior right temporal lobe, left parietooccipital lobes, superior cerebellar vermis and within the pons have increased in size since the prior brain MRI of 04/13/2022, as described. Edema surrounding the lesions within the right temporal lobe and left parietooccipital lobes has progressed. Mass effect related to the right temporal lobe lesion has also progressed, now with 4 mm leftward midline shift. Additionally, the medial right temporal lobe now exerts some mass effect upon the right cerebral peduncle. 2. The remaining lesions within the supratentorial and infratentorial brain have not significantly changed. No new intracranial metastasis is identified.     Since I last saw her she had  worsening hemoptysis that actually frightened her and she asked was concerned she was coughing up actual tissue.  She had a repeat CT scan done in July 2024 which showed  IMPRESSION: 1. Unchanged hypodense lesion of hepatic segment V adjacent to the gallbladder fossa with overlying capsular retraction, consistent with treated metastatic disease. No new liver lesions. 2. No evidence of new metastatic disease in the chest, abdomen, or pelvis. 3. Status post left upper lobectomy with very extensive chronic cystic bronchiectasis and scarring throughout the remaining left lower lobe. 4. Mild underlying centrilobular and paraseptal emphysema with diffuse centrilobular nodularity throughout the lungs, consistent with smoking related respiratory bronchiolitis. 5. New, nonspecific infectious or inflammatory ground-glass airspace opacity in the dependent right lower lobe. Attention on follow-up. 6. Hepatic steatosis.  She was seen by my partner Dr. Thedore Mins who checked voriconazole level that was therapeutic.  She also saw Dr. Judeth Horn with pulmonary critical care medicine he was concerned that the worsening hemoptysis and coughing it occurred in the context of her Avastin therapy.  Her tumor seems to have responded nice to it to Avastin but Avastin is currently being held due to concerns that it could have been contributing to her coughing.  She does say that she feels better in terms of having less blood come up when she coughs and less cough overall in the weeks since she has come off the Avastin she also was given a 2-week course of antibacterial antibiotics as well.  Her new insurance apparently does not cover the voriconazole but with patient assistance she can get Cresemba for $25 a prescription 7. Sigmoid diverticulosis.   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   Asthma    Breast cancer (HCC)    Bronchiectasis (HCC)     Bronchiolitis    Cancer (HCC)    breast cancer 2014   Cancer, metastatic to liver (HCC)    2021   Complication of anesthesia    bp dropped + desat    COPD (chronic obstructive pulmonary disease) (HCC)    Dyspnea    DOE   GERD (gastroesophageal reflux disease)    H/O coccidioidomycosis    was reason for lung lobectomy   Headache(784.0)    due to eye strain or not eating   History of anemia    no current problem   History of asthma    as a child   History of breast cancer 2014   left   History of chemotherapy    finished 07/17/2013   History of hiatal hernia    AGE 63   Hx of radiation therapy 03/25/13-05/06/13   left breast 5000 cGy/25 sessions, left breast boost 1000 cGy/5 sessions   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   Pneumonia    LAST FLARE UP 01/2018   Rash 02/22/2021   Runny nose 07/30/2013   clear drainage   Smoker 05/24/2021   Wears dentures    upper    Past Surgical History:  Procedure Laterality Date   APPLICATION OF CRANIAL NAVIGATION N/A 08/04/2017   Procedure: APPLICATION OF CRANIAL NAVIGATION;  Surgeon: [name], Loura Halt, MD;  Location: [facility];  Service: Neurosurgery;  Laterality: N/A;   AXILLARY LYMPH NODE DISSECTION Left 02/08/2013   Procedure: LEFT AXILLARY DISSECTION;  Surgeon: Mariella Saa, MD;  Location: [facility];  Service: General;  Laterality: Left;   BREAST CYST EXCISION Right 2006   BREAST LUMPECTOMY Left 2014   BREAST LUMPECTOMY WITH NEEDLE LOCALIZATION AND AXILLARY SENTINEL LYMPH NODE BX Left 12/31/2012   Procedure: NEEDLE LOCALIZATION LEFT BREAST LUMPECTOMY AND LEFT AXILLARY SENTENIAL LYMPH NODE BX;  Surgeon: Mariella Saa, MD;  Location: [facility];  Service: General;  Laterality: Left;   BRONCHIAL BRUSHINGS  03/17/2020   Procedure: BRONCHIAL BRUSHINGS;  Surgeon: Josephine Igo, DO;  Location: [facility];  Service: Pulmonary;;   BRONCHIAL WASHINGS  03/17/2020   Procedure: BRONCHIAL WASHINGS;  Surgeon: Josephine Igo, DO;  Location: [facility];  Service: Pulmonary;;   CESAREAN SECTION  1995/1996   CRANIOTOMY Right 08/04/2017   Procedure: Right Frontal craniotomy for resection of tumor with stereotactic navigation;  Surgeon: [name], Loura Halt, MD;  Location: [facility] OR;  Service: Neurosurgery;  Laterality: Right;  Right Frontal craniotomy for resection of tumor with stereotactic navigation   IR CV LINE INJECTION  02/19/2021   IR CV LINE INJECTION  11/12/2021   IR IMAGING GUIDED PORT INSERTION  05/31/2019  LUNG LOBECTOMY Left 05/1996   upper lobe - due to [facility] Fever   PORT-A-CATH REMOVAL Right 08/02/2013   Procedure: REMOVAL PORT-A-CATH;  Surgeon: Mariella Saa, MD;  Location: [facility];  Service: General;  Laterality: Right;   PORTACATH PLACEMENT  07/02/2012   Procedure: INSERTION PORT-A-CATH;  Surgeon: Mariella Saa, MD;  Location:  SURGERY CENTER;  Service: General;  Laterality: N/A;  right   VIDEO BRONCHOSCOPY N/A 03/17/2020   Procedure: VIDEO BRONCHOSCOPY;  Surgeon: Josephine Igo, DO;  Location: [facility];  Service: Pulmonary;  Laterality: N/A;   VIDEO BRONCHOSCOPY WITH ENDOBRONCHIAL ULTRASOUND N/A 05/23/2018   Procedure: VIDEO BRONCHOSCOPY WITH ENDOBRONCHIAL ULTRASOUND;  Surgeon: Josephine Igo, DO;  Location: [facility] OR;  Service: Thoracic;  Laterality: N/A;    Family History  Problem Relation Age of Onset   Emphysema Mother        was a smoker   Heart disease Mother    Melanoma Mother        dx in her 22s   Breast cancer Mother 49   Asthma Brother    Breast cancer Cousin        mother's maternal cousin; dx in her 18s      Social History   Socioeconomic History   Marital status: Married    Spouse name: Not on file   Number of children: 2   Years of education: Not on file   Highest education level: Not on file  Occupational History   Occupation: Investment banker, corporate  Tobacco Use   Smoking status: Every Day    Current packs/day: 0.30     Average packs/day: 0.3 packs/day for 38.0 years (11.4 ttl pk-yrs)    Types: Cigarettes   Smokeless tobacco: Never   Tobacco comments:    10 cigarettes smoked daily 10/19/20 [facility]   Vaping Use   Vaping status: Never Used  Substance and Sexual Activity   Alcohol use: No   Drug use: No   Sexual activity: Not Currently  Other Topics Concern   Not on file  Social History Narrative   Not on file   Social Determinants of Health   Financial Resource Strain: Low Risk  (01/03/2023)   Received from [facility], [facility]   Overall Financial Resource Strain (CARDIA)    Difficulty of Paying Living Expenses: Not hard at all  Food Insecurity: No Food Insecurity (01/30/2023)   Hunger Vital Sign    Worried About Running Out of Food in the Last Year: Never true    Ran Out of Food in the Last Year: Never true  Transportation Needs: No Transportation Needs (01/30/2023)   PRAPARE - Administrator, Civil Service (Medical): No    Lack of Transportation (Non-Medical): No  Physical Activity: Not on file  Stress: Not on file  Social Connections: Not on file    Allergies  Allergen Reactions   Aspirin Anaphylaxis and Shortness Of Breath    THROAT CLOSES   Protonix [Pantoprazole] Nausea Only and Other (See Comments)    Also caused a "film in the mouth" and caused chest pressure   Doxycycline Other (See Comments)    Hallucinations, headaches   Promethazine-Codeine Cough    Worsening cough and insomnia    Sulfamethoxazole-Trimethoprim Other (See Comments)   Iodinated Contrast Media Rash    Patient allergic to contrast used in radiation oncology for CT simulation      Current Outpatient Medications:    acetaminophen (  TYLENOL) 325 MG tablet, Take 2 tablets (650 mg total) by mouth every 6 (six) hours as needed for mild pain (or Fever >/= 101)., Disp: 12 tablet, Rfl: 0   albuterol (VENTOLIN HFA) 108 (90 Base) MCG/ACT inhaler, Inhale 2 puffs into the lungs  every 6 (six) hours as needed for wheezing or shortness of breath (cough)., Disp: 18 g, Rfl: 5   calcium carbonate (TUMS EX) 750 MG chewable tablet, Chew by mouth., Disp: , Rfl:    chlorpheniramine-HYDROcodone (TUSSIONEX) 10-8 MG/5ML, Take 5 mLs by mouth 2 (two) times daily., Disp: 115 mL, Rfl: 0   cycloSPORINE (RESTASIS) 0.05 % ophthalmic emulsion, Place 1 drop into the right eye., Disp: , Rfl:    Fluticasone-Salmeterol,sensor, (AIRDUO DIGIHALER) 113-14 MCG/ACT AEPB, Inhale 1 puff into the lungs 2 (two) times daily., Disp: 1 each, Rfl: 4   gabapentin (NEURONTIN) 100 MG capsule, Take 1 capsule (100 mg total) by mouth 2 (two) times daily., Disp: 120 capsule, Rfl: 1   GARLIC PO, Take by mouth., Disp: , Rfl:    levalbuterol (XOPENEX) 0.63 MG/3ML nebulizer solution, Take 3 mLs (0.63 mg total) by nebulization every 4 (four) hours as needed for wheezing or shortness of breath., Disp: 90 mL, Rfl: 12   lidocaine-prilocaine (EMLA) cream, APPLY EXTERNALLY TO THE AFFECTED AREA 1 TIME, Disp: 30 g, Rfl: 3   Multiple Vitamins-Minerals (EMERGEN-C VITAMIN C PO), Take by mouth., Disp: , Rfl:    naproxen sodium (ALEVE) 220 MG tablet, Take by mouth., Disp: , Rfl:    OIL OF OREGANO PO, Take 500 mg by mouth 2 (two) times daily., Disp: , Rfl:    ondansetron (ZOFRAN-ODT) 4 MG disintegrating tablet, Take 4 mg by mouth every 8 (eight) hours as needed., Disp: , Rfl:    SYMBICORT 160-4.5 MCG/ACT inhaler, Inhale 2 puffs into the lungs 2 (two) times daily., Disp: , Rfl:    Vitamin E (VITAMIN E/D-ALPHA NATURAL) 268 MG (400 UNIT) CAPS, Take by mouth., Disp: , Rfl:    voriconazole (VFEND) 200 MG tablet, Take 1 tablet (200 mg total) by mouth 2 (two) times daily., Disp: 60 tablet, Rfl: 11 No current facility-administered medications for this visit.  Facility-Administered Medications Ordered in Other Visits:    alteplase (CATHFLO ACTIVASE) injection 2 mg, 2 mg, Intracatheter, Once PRN, Serena Croissant, MD   sodium chloride flush  (NS) 0.9 % injection 10 mL, 10 mL, Intracatheter, PRN, Serena Croissant, MD, 10 mL at 07/06/21 1428    Review of Systems  Constitutional:  Negative for activity change, appetite change, chills, diaphoresis, fatigue, fever and unexpected weight change.  HENT:  Negative for congestion, rhinorrhea, sinus pressure, sneezing, sore throat and trouble swallowing.   Eyes:  Negative for photophobia and visual disturbance.  Respiratory:  Positive for cough and shortness of breath. Negative for chest tightness, wheezing and stridor.   Cardiovascular:  Negative for chest pain, palpitations and leg swelling.  Gastrointestinal:  Negative for abdominal distention, abdominal pain, anal bleeding, blood in stool, constipation, diarrhea, nausea and vomiting.  Genitourinary:  Negative for difficulty urinating, dysuria, flank pain and hematuria.  Musculoskeletal:  Negative for arthralgias, back pain, gait problem, joint swelling and myalgias.  Skin:  Negative for color change, pallor, rash and wound.  Neurological:  Negative for dizziness, tremors, weakness and light-headedness.  Hematological:  Negative for adenopathy. Does not bruise/bleed easily.  Psychiatric/Behavioral:  Negative for agitation, behavioral problems, confusion, decreased concentration, dysphoric mood and sleep disturbance.        Objective:   Physical  Exam Constitutional:      General: She is not in acute distress.    Appearance: Normal appearance. She is well-developed. She is not ill-appearing or diaphoretic.  HENT:     Head: Normocephalic and atraumatic.     Right Ear: Hearing and external ear normal.     Left Ear: Hearing and external ear normal.     Nose: No nasal deformity or rhinorrhea.  Eyes:     General: No scleral icterus.    Conjunctiva/sclera: Conjunctivae normal.     Right eye: Right conjunctiva is not injected.     Left eye: Left conjunctiva is not injected.     Pupils: Pupils are equal, round, and reactive to light.   Neck:     Vascular: No JVD.  Cardiovascular:     Rate and Rhythm: Normal rate and regular rhythm.     Heart sounds: Normal heart sounds, S1 normal and S2 normal. No murmur heard.    No friction rub.  Pulmonary:     Effort: Prolonged expiration present.  Abdominal:     General: Bowel sounds are normal. There is no distension.     Palpations: Abdomen is soft.     Tenderness: There is no abdominal tenderness.  Musculoskeletal:        General: Normal range of motion.     Right shoulder: Normal.     Left shoulder: Normal.     Cervical back: Normal range of motion and neck supple.     Right hip: Normal.     Left hip: Normal.     Right knee: Normal.     Left knee: Normal.  Lymphadenopathy:     Head:     Right side of head: No submandibular, preauricular or posterior auricular adenopathy.     Left side of head: No submandibular, preauricular or posterior auricular adenopathy.     Cervical: No cervical adenopathy.     Right cervical: No superficial or deep cervical adenopathy.    Left cervical: No superficial or deep cervical adenopathy.  Skin:    General: Skin is warm and dry.     Capillary Refill: End expiratory wheezing    Coloration: Skin is not pale.     Findings: No abrasion, bruising, ecchymosis, erythema, lesion or rash.     Nails: There is no clubbing.  Neurological:     Mental Status: She is alert and oriented to person, place, and time.     Sensory: No sensory deficit.     Coordination: Coordination normal.     Gait: Gait normal.  Psychiatric:        Attention and Perception: She is attentive.        Speech: Speech normal.        Behavior: Behavior normal. Behavior is cooperative.        Thought Content: Thought content normal.        Judgment: Judgment normal.           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  I think at present it is reasonable to continue antifungal therapy.  Certainly if her coughing worsens I would recommend collecting some sputum for AFB stain and culture and fungal stain and culture.  1 could contemplate bronchoscopy but I think this would be a  risky procedure in her.  Breast cancer with metastasis to the brain  Medicine currently being held recent MRI of the brain showed decrease size enhancement of peripherally enhancing lesions in the right superior frontal gyrus right medial orbital frontal gyrus right temporal lobe resection cavity central pons and superior cerebellar vermis.  She is being followed very closely by Dr.Vaslow and Dr Pamelia Hoit  Smoking: Tried to smoke through changing to vaping but it made her cough worsen.  Vaccine counseling recommended updated COVID and flu shot and she very much wanted the flu shot  I have personally spent 42 minutes involved in face-to-face and non-face-to-face activities for this patient on the day of the visit. Professional time spent includes the following activities: Preparing to see the patient (review of tests), Obtaining and/or reviewing separately obtained history (admission/discharge record), Performing a medically appropriate examination and/or evaluation , Ordering medications/tests/procedures, referring and communicating with other health care professionals, Documenting clinical information in the EMR, Independently interpreting results (not separately reported), Communicating results to the patient/family/caregiver, Counseling and educating the patient/family/caregiver and Care coordination (not separately reported).

## 2023-05-09 ENCOUNTER — Other Ambulatory Visit: Payer: Self-pay

## 2023-05-12 ENCOUNTER — Other Ambulatory Visit: Payer: Self-pay

## 2023-05-15 ENCOUNTER — Other Ambulatory Visit: Payer: Self-pay

## 2023-05-16 ENCOUNTER — Inpatient Hospital Stay: Payer: Medicare Other

## 2023-05-16 ENCOUNTER — Inpatient Hospital Stay: Payer: Medicare Other | Attending: Hematology and Oncology | Admitting: Hematology and Oncology

## 2023-05-16 ENCOUNTER — Other Ambulatory Visit: Payer: Self-pay

## 2023-05-16 ENCOUNTER — Other Ambulatory Visit (HOSPITAL_COMMUNITY): Payer: Self-pay

## 2023-05-16 VITALS — BP 120/72 | HR 88 | Temp 97.2°F | Resp 18 | Ht 62.0 in | Wt 115.3 lb

## 2023-05-16 DIAGNOSIS — C50212 Malignant neoplasm of upper-inner quadrant of left female breast: Secondary | ICD-10-CM | POA: Diagnosis present

## 2023-05-16 DIAGNOSIS — C7931 Secondary malignant neoplasm of brain: Secondary | ICD-10-CM | POA: Insufficient documentation

## 2023-05-16 DIAGNOSIS — Z170 Estrogen receptor positive status [ER+]: Secondary | ICD-10-CM | POA: Diagnosis not present

## 2023-05-16 DIAGNOSIS — C50912 Malignant neoplasm of unspecified site of left female breast: Secondary | ICD-10-CM

## 2023-05-16 DIAGNOSIS — C7951 Secondary malignant neoplasm of bone: Secondary | ICD-10-CM | POA: Diagnosis not present

## 2023-05-16 DIAGNOSIS — Z79899 Other long term (current) drug therapy: Secondary | ICD-10-CM | POA: Insufficient documentation

## 2023-05-16 DIAGNOSIS — C787 Secondary malignant neoplasm of liver and intrahepatic bile duct: Secondary | ICD-10-CM | POA: Diagnosis not present

## 2023-05-16 DIAGNOSIS — Z5112 Encounter for antineoplastic immunotherapy: Secondary | ICD-10-CM | POA: Diagnosis present

## 2023-05-16 DIAGNOSIS — Z171 Estrogen receptor negative status [ER-]: Secondary | ICD-10-CM | POA: Diagnosis not present

## 2023-05-16 LAB — CBC WITH DIFFERENTIAL (CANCER CENTER ONLY)
Abs Immature Granulocytes: 0.02 10*3/uL (ref 0.00–0.07)
Basophils Absolute: 0.1 10*3/uL (ref 0.0–0.1)
Basophils Relative: 1 %
Eosinophils Absolute: 1.1 10*3/uL — ABNORMAL HIGH (ref 0.0–0.5)
Eosinophils Relative: 9 %
HCT: 30.3 % — ABNORMAL LOW (ref 36.0–46.0)
Hemoglobin: 9.3 g/dL — ABNORMAL LOW (ref 12.0–15.0)
Immature Granulocytes: 0 %
Lymphocytes Relative: 17 %
Lymphs Abs: 1.9 10*3/uL (ref 0.7–4.0)
MCH: 26.6 pg (ref 26.0–34.0)
MCHC: 30.7 g/dL (ref 30.0–36.0)
MCV: 86.6 fL (ref 80.0–100.0)
Monocytes Absolute: 1 10*3/uL (ref 0.1–1.0)
Monocytes Relative: 9 %
Neutro Abs: 7.4 10*3/uL (ref 1.7–7.7)
Neutrophils Relative %: 64 %
Platelet Count: 433 10*3/uL — ABNORMAL HIGH (ref 150–400)
RBC: 3.5 MIL/uL — ABNORMAL LOW (ref 3.87–5.11)
RDW: 19.1 % — ABNORMAL HIGH (ref 11.5–15.5)
WBC Count: 11.5 10*3/uL — ABNORMAL HIGH (ref 4.0–10.5)
nRBC: 0 % (ref 0.0–0.2)

## 2023-05-16 MED ORDER — DIPHENHYDRAMINE HCL 25 MG PO CAPS
25.0000 mg | ORAL_CAPSULE | Freq: Once | ORAL | Status: AC
Start: 2023-05-16 — End: 2023-05-16
  Administered 2023-05-16: 25 mg via ORAL

## 2023-05-16 MED ORDER — SODIUM CHLORIDE 0.9 % IV SOLN
6.0000 mg/kg | Freq: Once | INTRAVENOUS | Status: AC
  Administered 2023-05-16: 300 mg via INTRAVENOUS
  Filled 2023-05-16: qty 14.29

## 2023-05-16 MED ORDER — SODIUM CHLORIDE 0.9 % IV SOLN: Freq: Once | INTRAVENOUS | Status: AC

## 2023-05-16 MED ORDER — ACETAMINOPHEN 325 MG PO TABS
650.0000 mg | ORAL_TABLET | Freq: Once | ORAL | Status: AC
  Administered 2023-05-16: 650 mg via ORAL

## 2023-05-16 NOTE — Assessment & Plan Note (Signed)
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  SRS brain: 08/25/2017-09/04/2017 2. Anti Her 2 therapy with Lapatinib started 09/17/2017-05/14/2019: Stopped for progression 3.  I discontinued tamoxifen and started her on letrozole 2.5 mg daily.  05/14/2019 stopped for progression 4.  Stereotactic radiosurgery 12/19/2017 to the new right parietal lobe metastases. 5.  Kadcyla: Received 16 cycles discontinued 05/05/2020 6.  Tucatinib Xeloda: Discontinued because of hand-foot syndrome 06/11/2020-12/15/2020 7. Herceptin/Neratinib: unable to tolerate due to diarrhea and weight loss 8.  SRS to new brain mets 10/01/2021 9.  Resection of brain metastases at [facility] 12/29/2022: Metastatic breast cancer proc panel pending 10.  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

## 2023-05-16 NOTE — Progress Notes (Signed)
Patient Care Team: Thana Ates, MD as PCP - General (Internal Medicine) Domenick Gong, MD as Referring Physician (Emergency Medicine)  DIAGNOSIS:  Encounter Diagnoses  Name Primary?   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 Ki-67 was 70% HER-2 was amplified with a ratio 2.91   07/12/2012 - 07/17/2013 Neo-Adjuvant Chemotherapy   TCH 6 followed by Herceptin maintenance   12/11/2012 Surgery   Left breast lumpectomy: 1.8 cm tumor 1 positive sentinel node, axillary lymph node dissection 02/08/2013 showed 0/13 lymph nodes   03/25/2013 - 05/06/2013 Radiation Therapy   Adjuvant radiation therapy   06/05/2013 - 07/20/2017 Anti-estrogen oral therapy   Tamoxifen 20 mg daily   07/27/2017 Relapse/Recurrence   MRI Brain: 3.4 x 2.9 x 2.9 cm RIGHT frontal lobe mass with imaging characteristics of solitary metastasis. Extensive vasogenic edema resulting in 9 mm RIGHT to LEFT midline shift. Equivocal very early LEFT ventricle entrapment.    08/04/2017 Surgery   Rt frontal brain resection: Poorly differentiated tumor IHC suggests breast primary ER and PR Positive   08/25/2017 - 09/04/2017 Radiation Therapy   Stereotactic radiation   09/18/2017 -  Anti-estrogen oral therapy   Lapatinib with letrozole   12/18/2017 - 12/19/2017 Radiation Therapy   New right parietal lobe metastases status post [facility]   08/27/2018 - 08/27/2020 Radiation Therapy   SRS to new brain metastases   05/09/2019 Relapse/Recurrence   Interval increase in size of the enhancing nodular left  internal mammary soft tissue 2.8 cm.  Redemonstrated enlarged supraclavicular, lower cervical and lower posterior cervical nodes unchanged.  Interval increase in the bony erosion of the posterior and lateral left third rib, increasing soft tissue lesion eroding the left sternal body 3.1 cm was 2.5 cm.  Bronchiectatic changes   06/19/2019 - 05/05/2020 Chemotherapy   ado-trastuzumab emtansine (KADCYLA)     06/15/2020 -  Chemotherapy   Xeloda, Tucatinib, Herceptin    04/12/2022 - 09/06/2022 Chemotherapy   Patient is on Treatment Plan : BREAST Trastuzumab IV (8/6) or SQ (600) D1 q21d     09/27/2022 -  Chemotherapy   Patient is on Treatment Plan : BREAST MAINTENANCE Trastuzumab IV (6) or SQ (600) D1 q21d x 13 cycles. Added bevacizumab (10) q21d starting cycle 8.     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 Added bevacizumab (10) q21d starting cycle 8.     Port-A-Cath in place    CHIEF COMPLIANT: F/U on Herceptin  HISTORY OF PRESENT ILLNESS:   History of Present Illness  The patient, with a history of met breast cancer on Herceptin and brain mets, presents for a follow-up visit. She reports that her coughing up blood has significantly reduced since stopping Avastin, with only occasional specks of blood in her mucus every three to four days. She also mentions a decrease in chest pain. However, due to insurance changes, she has had to switch from  breconazole to [place] for her aspergillus treatment. She expresses some frustration and uncertainty about this, as her doctor has recently questioned whether aspergillus is the correct diagnosis. Despite these concerns, the patient reports feeling generally well, though she has been experiencing some fatigue, which she attributes to either the lingering effects of Avastin or the demands of caring for a new puppy. She also mentions a brief period of depression following her last surgery, which has been alleviated by the arrival of the puppy.         ALLERGIES:  is allergic to aspirin, protonix [pantoprazole], doxycycline, promethazine-codeine, sulfamethoxazole-trimethoprim, and iodinated contrast media.  MEDICATIONS:  Current Outpatient Medications  Medication Sig Dispense Refill   acetaminophen (TYLENOL) 325 MG tablet Take 2 tablets (650 mg total) by mouth every 6 (six) hours as needed for mild pain (or Fever >/= 101). 12 tablet 0   albuterol (VENTOLIN HFA) 108 (90 Base) MCG/ACT inhaler Inhale 2 puffs into the lungs every 6 (six) hours as needed for wheezing or shortness of breath (cough). 18 g 5   calcium carbonate (TUMS EX) 750 MG chewable tablet Chew by mouth.     chlorpheniramine-HYDROcodone (TUSSIONEX) 10-8 MG/5ML Take 5 mLs by mouth 2 (two) times daily. 115 mL 0   cycloSPORINE (RESTASIS) 0.05 % ophthalmic emulsion Place 1 drop into the right eye.     Fluticasone-Salmeterol,sensor, (AIRDUO DIGIHALER) 113-14 MCG/ACT AEPB Inhale 1 puff into the lungs 2 (two) times daily. 1 each 4   gabapentin (NEURONTIN) 100 MG capsule Take 1 capsule (100 mg total) by mouth 2 (two) times daily. 120 capsule 1   GARLIC PO Take by mouth.     Isavuconazonium Sulfate (CRESEMBA) 186 MG CAPS Take three tablets every 8 hours for 3 days and then 2 tablets daily 68 capsule 11   levalbuterol (XOPENEX) 0.63 MG/3ML nebulizer solution Take 3 mLs (0.63 mg total) by nebulization every 4 (four) hours as needed for wheezing or  shortness of breath. 90 mL 12   lidocaine-prilocaine (EMLA) cream APPLY EXTERNALLY TO THE AFFECTED AREA 1 TIME 30 g 3   Multiple Vitamins-Minerals (EMERGEN-C VITAMIN C PO) Take by mouth.     naproxen sodium (ALEVE) 220 MG tablet Take by mouth.     OIL OF OREGANO PO Take 500 mg by mouth 2 (two) times daily.     ondansetron (ZOFRAN-ODT) 4 MG disintegrating tablet Take 4 mg by mouth every 8 (eight) hours as needed.     SYMBICORT 160-4.5 MCG/ACT inhaler Inhale 2 puffs into the lungs 2 (two) times daily.     Vitamin E (VITAMIN E/D-ALPHA NATURAL) 268 MG (400 UNIT) CAPS Take by mouth.     No current facility-administered medications for this visit.   Facility-Administered Medications Ordered in Other Visits  Medication Dose Route Frequency Provider Last Rate Last Admin   alteplase (CATHFLO ACTIVASE) injection 2 mg  2 mg Intracatheter Once PRN Serena Croissant, MD       sodium chloride flush (NS) 0.9 % injection 10 mL  10 mL Intracatheter PRN Serena Croissant, MD   10 mL at 07/06/21 1428    PHYSICAL EXAMINATION:  ECOG PERFORMANCE STATUS: 1 - Symptomatic but completely ambulatory  Vitals:   05/16/23 1019  BP: 120/72  Pulse: 88  Resp: 18  Temp: (!) 97.2 F (36.2 C)  SpO2: 99%   Filed Weights   05/16/23 1019  Weight: 115 lb 4.8 oz (52.3 kg)     LABORATORY DATA:  I have reviewed the data as listed    Latest Ref Rng & Units 03/28/2023   10:51 AM 11/29/2022    9:39 AM 05/24/2022   10:31 AM  CMP  Glucose 65 - 99 mg/dL 84  92  56   BUN 7 - 25 mg/dL 9  19  11    Creatinine 0.50 - 1.03 mg/dL 1.61  0.96  0.45   Sodium 135 - 146 mmol/L 136  140  139   Potassium 3.5 - 5.3 mmol/L 4.7  3.6  4.5   Chloride 98 - 110 mmol/L 101  106  104   CO2 20 - 32 mmol/L 24  28  31    Calcium 8.6 - 10.4 mg/dL 9.3  9.1  40.9   Total Protein 6.1 - 8.1 g/dL 6.3  6.5  8.2   Total Bilirubin 0.2 - 1.2 mg/dL 0.2  0.2  0.2   Alkaline Phos 38 - 126 U/L  62  74   AST 10 - 35 U/L 13  13  14    ALT 6 - 29 U/L 13  14  8       Lab Results  Component Value Date   WBC 11.5 (H) 05/16/2023   HGB 9.3 (L) 05/16/2023   HCT 30.3 (L) 05/16/2023   MCV 86.6 05/16/2023   PLT 433 (H) 05/16/2023   NEUTROABS 7.4 05/16/2023    ASSESSMENT & PLAN:  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  SRS brain: 08/25/2017-09/04/2017 2. Anti Her 2 therapy with Lapatinib started 09/17/2017-05/14/2019: Stopped for progression 3.  I discontinued tamoxifen and started her on letrozole 2.5 mg daily.  05/14/2019 stopped for progression 4.  Stereotactic radiosurgery 12/19/2017 to the new right parietal lobe metastases. 5.  Kadcyla: Received 16 cycles discontinued 05/05/2020 6.  Tucatinib Xeloda: Discontinued because of hand-foot syndrome 06/11/2020-12/15/2020 7. Herceptin/Neratinib: unable to tolerate due to diarrhea and weight loss 8.  SRS to new brain mets 10/01/2021 9.  Resection of brain metastases at [facility] 12/29/2022: Metastatic breast cancer proc panel pending 10.  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 12/29/2022 ---------------------------------------------------------------------- Current treatment: Herceptin (because of hemoptysis Avastin was discontinued 02/21/2023-04/05/2023) Brain MRI scheduled for 06/15/2023 ------------------------------------- Assessment and Plan  Brain Lesion Stable on current regimen. Avastin was discontinued due to hemoptysis, which has since improved. -Continue current treatment plan. -Plan for brain imaging every two months.  Aspergillosis Uncertainty regarding diagnosis. Patient reports reduced chest pain and less hemoptysis. Insurance change necessitated switch from Voriconazole to [place]. -Start Cresemba as replacement for Voriconazole. -Consider bronchoscopy in November or December for further evaluation.  General Health Maintenance -Whole body scan planned for next month. -Continue to monitor for signs of depression, consider mental health support if needed.          Orders Placed This Encounter  Procedures   CT CHEST ABDOMEN PELVIS W CONTRAST    Standing Status:   Future    Standing Expiration Date:   05/15/2024    Order Specific Question:   If indicated for the ordered procedure, I authorize the administration of contrast media per Radiology protocol    Answer:   Yes    Order Specific Question:   Does the patient have a contrast media/X-ray dye allergy?    Answer:   No    Order Specific Question:   Is patient pregnant?    Answer:   No    Order Specific Question:   Preferred imaging location?    Answer:   [facility]    Order Specific Question:   Release to patient    Answer:   Immediate    Order Specific Question:   If indicated for the ordered procedure, I authorize the administration of oral contrast media per Radiology  protocol    Answer:   Yes   CBC with Differential (Cancer Center Only)    Standing Status:   Future    Standing Expiration Date:   06/26/2024   Total Protein, Urine dipstick    Standing Status:   Future    Standing Expiration Date:   06/26/2024   CBC with Differential (Cancer Center Only)    Standing Status:   Future    Standing Expiration Date:   07/17/2024   Total Protein, Urine dipstick    Standing Status:   Future    Standing Expiration Date:   07/17/2024   CBC with Differential (Cancer Center Only)    Standing Status:   Future    Standing Expiration Date:   08/07/2024   Total Protein, Urine dipstick    Standing Status:   Future    Standing Expiration Date:   08/07/2024   CBC with Differential (Cancer Center Only)    Standing Status:   Future    Standing Expiration Date:   08/28/2024   Total Protein, Urine dipstick    Standing Status:   Future    Standing Expiration Date:   08/28/2024   CBC with Differential (Cancer Center Only)    Standing Status:   Future    Standing Expiration Date:   09/18/2024   Total Protein, Urine dipstick    Standing Status:   Future    Standing Expiration Date:   09/18/2024   CBC with Differential (Cancer Center Only)    Standing Status:   Future    Standing Expiration Date:   10/09/2024   Total Protein, Urine dipstick    Standing Status:   Future    Standing Expiration Date:   10/09/2024   The patient has a good understanding of the overall plan. she agrees with it. she will call with any problems that may develop before the next visit here. Total time spent: 30 mins including face to face time and  time spent for planning, charting and co-ordination of care   Tamsen Meek, MD 05/16/23

## 2023-05-17 ENCOUNTER — Other Ambulatory Visit: Payer: Self-pay

## 2023-05-18 ENCOUNTER — Other Ambulatory Visit: Payer: Self-pay

## 2023-05-18 ENCOUNTER — Other Ambulatory Visit (HOSPITAL_COMMUNITY): Payer: Self-pay

## 2023-05-19 ENCOUNTER — Other Ambulatory Visit: Payer: Self-pay

## 2023-05-23 ENCOUNTER — Encounter: Payer: Self-pay | Admitting: Adult Health

## 2023-05-24 ENCOUNTER — Other Ambulatory Visit (HOSPITAL_COMMUNITY): Payer: Self-pay

## 2023-05-24 ENCOUNTER — Other Ambulatory Visit (HOSPITAL_BASED_OUTPATIENT_CLINIC_OR_DEPARTMENT_OTHER): Payer: Self-pay

## 2023-05-24 ENCOUNTER — Encounter: Payer: Self-pay | Admitting: Hematology and Oncology

## 2023-05-24 ENCOUNTER — Ambulatory Visit
Admission: RE | Admit: 2023-05-24 | Discharge: 2023-05-24 | Disposition: A | Discharge status: Home / Self Care | Payer: Medicare Other | Source: Ambulatory Visit | Attending: Hematology and Oncology | Admitting: Hematology and Oncology

## 2023-05-24 ENCOUNTER — Other Ambulatory Visit: Payer: Self-pay

## 2023-05-24 DIAGNOSIS — Z1231 Encounter for screening mammogram for malignant neoplasm of breast: Secondary | ICD-10-CM

## 2023-05-24 HISTORY — DX: Personal history of antineoplastic chemotherapy: Z92.21

## 2023-05-24 HISTORY — DX: Malignant neoplasm of unspecified site of left female breast: C50.912

## 2023-05-24 HISTORY — DX: Personal history of irradiation: Z92.3

## 2023-05-24 HISTORY — DX: Secondary malignant neoplasm of bone: C79.51

## 2023-05-29 ENCOUNTER — Telehealth: Payer: Self-pay

## 2023-05-29 ENCOUNTER — Other Ambulatory Visit: Payer: Self-pay

## 2023-05-29 NOTE — Telephone Encounter (Signed)
Called and left a message asking her to call the office back regarding mychart message. Offering appt with [facility] here at [facility]. Left office # asking her to call the office back.

## 2023-05-29 NOTE — Telephone Encounter (Addendum)
Called to clarify skin changes and pictures sent on mychart message. She thinks that the skin changes are a possible side effect to Avastin that she no longer even is getting. She has had this since after the 3 rd treatment of Avastin. She said that she just never complained about the skin changes.   Attempted to get appt with [facility] for this week. [facility] thinks that she needs a referral to dermatology and declined appt.  Julisia is asking if she can see Melissa Anes, DNP on 11/26 appts? She has never seen a dermatologist and is worried about the referral taking so long.

## 2023-05-30 ENCOUNTER — Other Ambulatory Visit: Payer: Self-pay

## 2023-05-30 ENCOUNTER — Encounter: Payer: Self-pay | Admitting: Hematology and Oncology

## 2023-05-30 NOTE — Telephone Encounter (Signed)
Dr. Valaria Good to you as Lorain Childes.  She wants to be seen by a provider here, which we should accommodate.  Nursing--can you see when her treatment is here and I will see if I can overbook her?

## 2023-05-31 ENCOUNTER — Other Ambulatory Visit: Payer: Self-pay

## 2023-06-01 ENCOUNTER — Other Ambulatory Visit: Payer: Self-pay | Admitting: Hematology and Oncology

## 2023-06-02 MED ORDER — HYDROCOD POLI-CHLORPHE POLI ER 10-8 MG/5ML PO SUER: 5.0000 mL | Freq: Two times a day (BID) | ORAL | 0 refills | Status: DC

## 2023-06-06 ENCOUNTER — Encounter: Payer: Self-pay | Admitting: Hematology and Oncology

## 2023-06-06 ENCOUNTER — Inpatient Hospital Stay: Payer: Medicare Other

## 2023-06-06 ENCOUNTER — Other Ambulatory Visit (HOSPITAL_COMMUNITY): Payer: Self-pay

## 2023-06-06 ENCOUNTER — Other Ambulatory Visit: Payer: Self-pay

## 2023-06-06 ENCOUNTER — Encounter: Payer: Self-pay | Admitting: Adult Health

## 2023-06-06 ENCOUNTER — Inpatient Hospital Stay (HOSPITAL_BASED_OUTPATIENT_CLINIC_OR_DEPARTMENT_OTHER): Payer: Medicare Other | Admitting: Adult Health

## 2023-06-06 VITALS — BP 126/70 | HR 67 | Temp 99.1°F | Resp 18

## 2023-06-06 VITALS — BP 123/76 | HR 100 | Temp 97.8°F | Resp 18

## 2023-06-06 DIAGNOSIS — C50912 Malignant neoplasm of unspecified site of left female breast: Secondary | ICD-10-CM

## 2023-06-06 DIAGNOSIS — R21 Rash and other nonspecific skin eruption: Secondary | ICD-10-CM | POA: Diagnosis not present

## 2023-06-06 DIAGNOSIS — R0602 Shortness of breath: Secondary | ICD-10-CM

## 2023-06-06 DIAGNOSIS — C50212 Malignant neoplasm of upper-inner quadrant of left female breast: Secondary | ICD-10-CM

## 2023-06-06 DIAGNOSIS — Z171 Estrogen receptor negative status [ER-]: Secondary | ICD-10-CM

## 2023-06-06 DIAGNOSIS — Z5112 Encounter for antineoplastic immunotherapy: Secondary | ICD-10-CM | POA: Diagnosis not present

## 2023-06-06 DIAGNOSIS — Z95828 Presence of other vascular implants and grafts: Secondary | ICD-10-CM

## 2023-06-06 LAB — CBC WITH DIFFERENTIAL (CANCER CENTER ONLY)
Abs Immature Granulocytes: 0.05 10*3/uL (ref 0.00–0.07)
Basophils Absolute: 0.1 10*3/uL (ref 0.0–0.1)
Basophils Relative: 1 %
Eosinophils Absolute: 0.9 10*3/uL — ABNORMAL HIGH (ref 0.0–0.5)
Eosinophils Relative: 8 %
HCT: 31.8 % — ABNORMAL LOW (ref 36.0–46.0)
Hemoglobin: 9.9 g/dL — ABNORMAL LOW (ref 12.0–15.0)
Immature Granulocytes: 0 %
Lymphocytes Relative: 17 %
Lymphs Abs: 2 10*3/uL (ref 0.7–4.0)
MCH: 26.6 pg (ref 26.0–34.0)
MCHC: 31.1 g/dL (ref 30.0–36.0)
MCV: 85.5 fL (ref 80.0–100.0)
Monocytes Absolute: 0.8 10*3/uL (ref 0.1–1.0)
Monocytes Relative: 7 %
Neutro Abs: 8.2 10*3/uL — ABNORMAL HIGH (ref 1.7–7.7)
Neutrophils Relative %: 67 %
Platelet Count: 473 10*3/uL — ABNORMAL HIGH (ref 150–400)
RBC: 3.72 MIL/uL — ABNORMAL LOW (ref 3.87–5.11)
RDW: 18.3 % — ABNORMAL HIGH (ref 11.5–15.5)
WBC Count: 12 10*3/uL — ABNORMAL HIGH (ref 4.0–10.5)
nRBC: 0 % (ref 0.0–0.2)

## 2023-06-06 MED ORDER — ACETAMINOPHEN 325 MG PO TABS
650.0000 mg | ORAL_TABLET | Freq: Once | ORAL | Status: AC
Start: 2023-06-06 — End: 2023-06-06
  Administered 2023-06-06: 650 mg via ORAL
  Filled 2023-06-06: qty 2

## 2023-06-06 MED ORDER — TRIAMCINOLONE ACETONIDE 0.5 % EX OINT
1.0000 | TOPICAL_OINTMENT | Freq: Two times a day (BID) | CUTANEOUS | 0 refills | Status: DC
  Filled 2023-06-06 (×2): qty 30, 15d supply, fill #0

## 2023-06-06 MED ORDER — DIPHENHYDRAMINE HCL 25 MG PO CAPS
25.0000 mg | ORAL_CAPSULE | Freq: Once | ORAL | Status: AC
  Administered 2023-06-06: 25 mg via ORAL
  Filled 2023-06-06: qty 1

## 2023-06-06 MED ORDER — TRASTUZUMAB-ANNS CHEMO 150 MG IV SOLR
6.0000 mg/kg | Freq: Once | INTRAVENOUS | Status: AC
  Administered 2023-06-06: 300 mg via INTRAVENOUS
  Filled 2023-06-06: qty 14.29

## 2023-06-06 MED ORDER — SODIUM CHLORIDE 0.9% FLUSH
10.0000 mL | Freq: Once | INTRAVENOUS | Status: AC
  Administered 2023-06-06: 10 mL via INTRAVENOUS

## 2023-06-06 MED ORDER — HEPARIN SOD (PORK) LOCK FLUSH 100 UNIT/ML IV SOLN
500.0000 [IU] | Freq: Once | INTRAVENOUS | Status: AC
  Administered 2023-06-06: 500 [IU] via INTRAVENOUS

## 2023-06-06 MED ORDER — SODIUM CHLORIDE 0.9% FLUSH
10.0000 mL | INTRAVENOUS | Status: DC | PRN
  Administered 2023-06-06: 10 mL via INTRAVENOUS

## 2023-06-06 MED ORDER — SODIUM CHLORIDE 0.9 % IV SOLN: Freq: Once | INTRAVENOUS | Status: AC

## 2023-06-06 NOTE — Progress Notes (Signed)
[facility] Cancer Follow up:    Thana Ates, MD 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 ---s/p Lumpectomy/ /Initial Ca 1997, Brain Mets 2019 Staging form: Breast, AJCC 7th Edition - Clinical: Stage IIA (T2, N0, cM0) - Unsigned Histopathologic type: 9931 Laterality: Left Tumor size (mm): 13 Staging comments: Staged at breast conference 12.4.13  - Pathologic: Stage IIA (T1c, N1, cM0) - Unsigned Histopathologic type: 9931 Laterality: Left Tumor size (mm): 13 - Pathologic: Stage IV (M1) - Signed by Melissa Socks, NP on 12/20/2022   SUMMARY OF ONCOLOGIC HISTORY: Oncology History  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 Ki-67 was 70% HER-2 was amplified with a ratio 2.91   07/12/2012 - 07/17/2013 Neo-Adjuvant Chemotherapy   TCH 6 followed by Herceptin maintenance   12/11/2012 Surgery   Left breast lumpectomy: 1.8 cm tumor 1 positive sentinel node, axillary lymph node dissection 02/08/2013 showed 0/13 lymph nodes   03/25/2013 - 05/06/2013 Radiation Therapy   Adjuvant radiation therapy   06/05/2013 - 07/20/2017 Anti-estrogen oral therapy   Tamoxifen 20 mg daily   07/27/2017 Relapse/Recurrence   MRI Brain: 3.4 x 2.9 x 2.9 cm RIGHT frontal lobe mass with imaging characteristics of solitary metastasis. Extensive vasogenic edema resulting in 9 mm RIGHT to LEFT midline shift. Equivocal very early LEFT ventricle entrapment.    08/04/2017 Surgery   Rt frontal brain resection: Poorly differentiated tumor IHC suggests breast primary ER and PR Positive   08/25/2017 - 09/04/2017 Radiation Therapy   Stereotactic radiation    09/18/2017 -  Anti-estrogen oral therapy   Lapatinib with letrozole   12/18/2017 - 12/19/2017 Radiation Therapy   New right parietal lobe metastases status post [facility]   08/27/2018 - 08/27/2020 Radiation Therapy   SRS to new brain metastases   05/09/2019 Relapse/Recurrence   Interval increase in size of the enhancing nodular left internal mammary soft tissue 2.8 cm.  Redemonstrated enlarged supraclavicular, lower cervical and lower posterior cervical nodes unchanged.  Interval increase in the bony erosion of the posterior and lateral left third rib, increasing soft tissue lesion eroding the left sternal body 3.1 cm was 2.5 cm.  Bronchiectatic changes   06/19/2019 - 05/05/2020 Chemotherapy   ado-trastuzumab emtansine (KADCYLA)     06/15/2020 -  Chemotherapy   Xeloda, Tucatinib, Herceptin    04/12/2022 - 09/06/2022 Chemotherapy   Patient is on Treatment Plan : BREAST Trastuzumab IV (8/6) or SQ (600) D1 q21d     09/27/2022 -  Chemotherapy   Patient is on Treatment Plan : BREAST MAINTENANCE Trastuzumab IV (6) or SQ (600) D1 q21d x 13 cycles. Added bevacizumab (10) q21d starting cycle 8.     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 Added bevacizumab (10) q21d starting cycle 8.     Port-A-Cath in place    CURRENT THERAPY: Herceptin  INTERVAL HISTORY: Melissa [name] 57 y.o. female returns for with a known history of metastatic breast cancer,  presents for a routine follow-up. She is currently on Herceptin therapy. The patient has an upcoming MRI brain scan and a CT scan of the chest, abdomen, and pelvis,.  Her most recent CT chest abdomen pelvis was last performed in July and showed unchanged liver lesions and no new metastatic disease. The patient's last echocardiogram was in June and showed a normal ejection fraction.  Avastin was discontinued due to hemoptysis and the hemoptysis has since resolved.  The patient reports daily coughing fits, sometimes severe enough to induce vomiting. The coughing up of blood has reportedly decreased. The patient has been on voriconazole for a fungal lung infection, but recently switched to [place] due to insurance issues. The patient has been on the new medication for approximately two weeks at the time of the conversation.  The patient also reports skin concerns, including a callus-like lesion on the thumb that has recurred despite being picked off multiple times. Similar lesions have appeared on other fingers. The lesions are tender to touch and occasionally itch. The patient also reports dry skin and hyperpigmentation on the face and neck, despite regular use of moisturizers and scrubs.  The patient's overall mood appears to be stable, although she expresses some anxiety about upcoming brain scans due to the location of her cancer. The patient also mentions a history of gout in the knee, which is currently being managed by her husband.   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/Initial Ca 1997, Brain Mets 2019 06/10/2019   Mediastinal adenopathy    H/O coccidioidomycosis and Aspergillosus 05/16/2018   DOE (dyspnea on exertion) 05/03/2018   Diarrhea 04/22/2018   Cavitary lesion of lung in area of previous cyts in apex of Sup Segment of LLL 03/29/2018   Aortic atherosclerosis (HCC) 01/22/2018   Emphysema lung (HCC) 01/22/2018   CAP (community acquired pneumonia) 11/23/2017   Metastasis to brain (HCC) 08/04/2017   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 (HCC) 07/27/2017   Nicotine abuse 07/27/2017   Migraines 07/27/2017   HCAP (healthcare-associated pneumonia) 11/26/2016   Upper airway cough syndrome 08/10/2016   Abnormal echocardiogram 08/07/2013   Chest tightness or pressure 08/07/2013   Hx of radiation therapy    Edema of left lower extremity 11/19/2012   Tachycardia 09/20/2012   Obstructive bronchiectasis (HCC) 01/26/2011   Cigarette smoker 01/26/2011    is allergic to aspirin, protonix [pantoprazole], doxycycline, promethazine-codeine, sulfamethoxazole-trimethoprim, and iodinated contrast media.  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)    Complication of anesthesia    bp dropped + desat    COPD (chronic obstructive pulmonary disease) (HCC)    Dyspnea    DOE   GERD (gastroesophageal reflux disease)    H/O coccidioidomycosis    was reason for lung lobectomy   Headache(784.0)    due to eye strain or not eating   Hemoptysis  05/08/2023   History of anemia    no current problem   History of asthma    as a child   History of breast cancer 2014   left   History of chemotherapy    finished 07/17/2013   History of hiatal hernia    AGE 51   Hx of radiation therapy 03/25/13-05/06/13   left breast 5000 cGy/25 sessions, left breast boost 1000 cGy/5 sessions   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   Personal history of chemotherapy    for liver cancer   Personal history of radiation therapy    Pneumonia    LAST FLARE UP 01/2018   Rash 02/22/2021   Runny nose 07/30/2013   clear drainage   Smoker 05/24/2021   Wears dentures    upper    SURGICAL HISTORY: Past Surgical History:  Procedure Laterality Date   APPLICATION OF CRANIAL NAVIGATION N/A 08/04/2017   Procedure: APPLICATION OF CRANIAL NAVIGATION;  Surgeon: [name], Loura Halt, MD;  Location: [facility] OR;  Service: Neurosurgery;  Laterality: N/A;   AXILLARY LYMPH NODE DISSECTION Left 02/08/2013   Procedure: LEFT AXILLARY DISSECTION;  Surgeon: Mariella Saa, MD;  Location: [facility];  Service: General;  Laterality: Left;   BREAST CYST EXCISION Right 2006   BREAST LUMPECTOMY Left 2014   BREAST LUMPECTOMY WITH NEEDLE LOCALIZATION AND AXILLARY SENTINEL LYMPH NODE BX Left 12/31/2012   Procedure: NEEDLE LOCALIZATION LEFT BREAST LUMPECTOMY AND LEFT AXILLARY SENTENIAL LYMPH NODE BX;  Surgeon: Mariella Saa, MD;  Location: [facility];  Service: General;  Laterality: Left;   BRONCHIAL BRUSHINGS  03/17/2020   Procedure: BRONCHIAL BRUSHINGS;  Surgeon: Josephine Igo, DO;  Location: [facility];  Service: Pulmonary;;   BRONCHIAL WASHINGS  03/17/2020   Procedure: BRONCHIAL WASHINGS;  Surgeon: Josephine Igo, DO;  Location: [facility];  Service: Pulmonary;;   CESAREAN SECTION  1995/1996   CRANIOTOMY Right 08/04/2017   Procedure: Right Frontal craniotomy for resection of tumor with stereotactic navigation;  Surgeon: [name], Loura Halt, MD;  Location:  [facility] OR;  Service: Neurosurgery;  Laterality: Right;  Right Frontal craniotomy for resection of tumor with stereotactic navigation   IR CV LINE INJECTION  02/19/2021   IR CV LINE INJECTION  11/12/2021   IR IMAGING GUIDED PORT INSERTION  05/31/2019   LUNG LOBECTOMY Left 05/1996   upper lobe - due to [facility] Fever   PORT-A-CATH REMOVAL Right 08/02/2013   Procedure: REMOVAL PORT-A-CATH;  Surgeon: Mariella Saa, MD;  Location: [facility];  Service: General;  Laterality: Right;   PORTACATH PLACEMENT  07/02/2012   Procedure: INSERTION PORT-A-CATH;  Surgeon: Mariella Saa, MD;  Location: [facility];  Service: General;  Laterality: N/A;  right  VIDEO BRONCHOSCOPY N/A 03/17/2020   Procedure: VIDEO BRONCHOSCOPY;  Surgeon: Josephine Igo, DO;  Location: [facility];  Service: Pulmonary;  Laterality: N/A;   VIDEO BRONCHOSCOPY WITH ENDOBRONCHIAL ULTRASOUND N/A 05/23/2018   Procedure: VIDEO BRONCHOSCOPY WITH ENDOBRONCHIAL ULTRASOUND;  Surgeon: Josephine Igo, DO;  Location: [facility] OR;  Service: Thoracic;  Laterality: N/A;    SOCIAL HISTORY: Social History   Socioeconomic History   Marital status: Married    Spouse name: Not on file   Number of children: 2   Years of education: Not on file   Highest education level: Not on file  Occupational History   Occupation: Investment banker, corporate  Tobacco Use   Smoking status: Every Day    Current packs/day: 0.30    Average packs/day: 0.3 packs/day for 38.0 years (11.4 ttl pk-yrs)    Types: Cigarettes   Smokeless tobacco: Never   Tobacco comments:    12 cigarettes a day-05/08/23  Vaping Use   Vaping status: Never Used  Substance and Sexual Activity   Alcohol use: No   Drug use: No   Sexual activity: Not Currently  Other Topics Concern   Not on file  Social History Narrative   Not on file   Social Determinants of Health   Financial Resource Strain: Low Risk  (01/03/2023)   [facility]   Overall Financial Resource Strain (CARDIA)    Difficulty of Paying Living Expenses: Not hard at all  Food Insecurity: No Food Insecurity (01/30/2023)   Hunger Vital Sign    Worried About Running Out of Food in the Last Year: Never true    Ran Out of Food in the Last Year: Never true  Transportation Needs: No Transportation Needs (01/30/2023)   PRAPARE - Administrator, Civil Service (Medical): No    Lack of Transportation (Non-Medical): No  Physical Activity: Not on file  Stress: Not on file  Social Connections: Not on file  Intimate Partner Violence: Not At Risk (01/30/2023)   Humiliation, Afraid, Rape, and Kick questionnaire    Fear of Current or Ex-Partner: No    Emotionally Abused: No    Physically Abused: No    Sexually Abused: No    FAMILY HISTORY: Family History  Problem Relation Age of Onset   Emphysema Mother        was a smoker   Heart disease Mother    Melanoma Mother        dx in her 31s   Breast cancer Mother 58   Breast cancer Cousin 68 - 54       mother's maternal cousin   Asthma Brother     Review of Systems  Constitutional:  Negative for appetite change, chills, fatigue, fever and unexpected weight change.  HENT:   Negative for hearing loss, lump/mass and trouble swallowing.   Eyes:  Negative for eye problems and icterus.  Respiratory:  Negative for chest tightness, cough and shortness of breath.   Cardiovascular:  Negative for chest pain, leg swelling and palpitations.  Gastrointestinal:  Negative for abdominal distention, abdominal pain, constipation, diarrhea, nausea and vomiting.  Endocrine: Negative for hot flashes.  Genitourinary:  Negative for difficulty urinating.   Musculoskeletal:  Negative for arthralgias.  Skin:  Negative for itching and rash.  Neurological:  Negative for dizziness, extremity weakness, headaches and numbness.  Hematological:  Negative for adenopathy. Does not bruise/bleed easily.   Psychiatric/Behavioral:  Negative for depression. The patient is not  nervous/anxious.       PHYSICAL EXAMINATION   Onc Performance Status - 06/06/23 1300       KPS SCALE   KPS % SCORE Normal activity with effort, some s/s of disease             Vitals:   06/06/23 1305  BP: 123/76  Pulse: 100  Resp: 18  Temp: 97.8 F (36.6 C)  SpO2: 99%    Physical Exam Constitutional:      General: She is not in acute distress.    Appearance: Normal appearance. She is not toxic-appearing.  HENT:     Head: Normocephalic and atraumatic.     Mouth/Throat:     Mouth: Mucous membranes are moist.     Pharynx: Oropharynx is clear. No oropharyngeal exudate or posterior oropharyngeal erythema.  Eyes:     General: No scleral icterus. Cardiovascular:     Rate and Rhythm: Normal rate and regular rhythm.     Pulses: Normal pulses.     Heart sounds: Normal heart sounds.  Pulmonary:     Effort: Pulmonary effort is normal.     Breath sounds: Normal breath sounds.  Abdominal:     General: Abdomen is flat. Bowel sounds are normal. There is no distension.     Palpations: Abdomen is soft.     Tenderness: There is no abdominal tenderness.  Musculoskeletal:        General: No swelling.     Cervical back: Neck supple.  Lymphadenopathy:     Cervical: No cervical adenopathy.  Skin:    General: Skin is warm and dry.     Findings: Rash present.     Comments: See pictures below of rash  Neurological:     General: No focal deficit present.     Mental Status: She is alert.  Psychiatric:        Mood and Affect: Mood normal.        Behavior: Behavior normal.        LABORATORY DATA:  CBC    Component Value Date/Time   WBC 12.0 (H) 06/06/2023 1223   WBC 11.4 (H) 03/28/2023 1051   RBC 3.72 (L) 06/06/2023 1223   HGB 9.9 (L) 06/06/2023 1223   HGB 13.1 08/12/2014 0955   HCT 31.8 (L) 06/06/2023 1223   HCT 40.4 08/12/2014 0955   PLT 473 (H) 06/06/2023 1223   PLT 228 08/12/2014 0955    MCV 85.5 06/06/2023 1223   MCV 98.2 08/12/2014 0955   MCH 26.6 06/06/2023 1223   MCHC 31.1 06/06/2023 1223   RDW 18.3 (H) 06/06/2023 1223   RDW 13.0 08/12/2014 0955   LYMPHSABS 2.0 06/06/2023 1223   LYMPHSABS 2.0 08/12/2014 0955   MONOABS 0.8 06/06/2023 1223   MONOABS 0.5 08/12/2014 0955   EOSABS 0.9 (H) 06/06/2023 1223   EOSABS 0.2 08/12/2014 0955   BASOSABS 0.1 06/06/2023 1223   BASOSABS 0.0 08/12/2014 0955      ASSESSMENT and THERAPY PLAN:   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    Metastatic Breast Cancer Stable disease on Herceptin. Hemoptysis that led to discontinuation of Avastin has since resolved.  -Continue Herceptin. -CT chest, abdomen, pelvis scheduled for 06/20/2023 to monitor disease status. -MRI brain scheduled for 06/15/2023 to monitor disease status. -Follow-up in 3 weeks. -f/u  with Dr. Barbaraann Cao about brain MRI.   Cardiac Function Normal EF on last echo in June. -Order repeat echo to monitor cardiac function.  Skin Lesions New skin lesions with itching and callus-like appearance. No family history of similar rash. No improvement with over-the-counter treatments. -Sent urgent referral to dermatology for evaluation and possible biopsy. -Prescribed Triamcinolone ointment, to be picked up at [facility].  Chronic Cough Daily coughing fits, sometimes leading to vomiting. No recent changes in cough pattern since starting Cresemba 2 weeks ago for mold infection. -Continue Cresemba and monitor response. -CT scan will provide additional information on lung status. - Continue Follow up with Dr. Algis Liming in infectious disease  Dry Skin Severe dry skin on face and neck, not improved with multiple moisturizers and scrubs. -Consider evaluation by dermatology  during upcoming visit for skin lesions.   All questions were answered. The patient knows to call the clinic with any problems, questions or concerns. We can certainly see the patient much sooner if necessary.  Total encounter time:30 minutes*in face-to-face visit time, chart review, lab review, care coordination, order entry, and documentation of the encounter time.    Lillard Anes, NP 06/06/23 2:02 PM Medical Oncology and Hematology [facility] [address] [place], [place] [address] Tel. [phone]    Fax. [phone]  *Total Encounter Time as defined by the Centers for Medicare and Medicaid Services includes, in addition to the face-to-face time of a patient visit (documented in the note above) non-face-to-face time: obtaining and reviewing outside history, ordering and reviewing medications, tests or procedures, care coordination (communications with other health care professionals or caregivers) and documentation in the medical record.

## 2023-06-06 NOTE — Patient Instructions (Signed)
[facility] - A DEPT OF [facility]  Discharge Instructions: Thank you for choosing [facility] to provide your oncology and hematology care.   If you have a lab appointment with the Cancer Center, please go directly to the Cancer Center and check in at the registration area.   Wear comfortable clothing and clothing appropriate for easy access to any Portacath or PICC line.   We strive to give you quality time with your provider. You may need to reschedule your appointment if you arrive late (15 or more minutes).  Arriving late affects you and other patients whose appointments are after yours.  Also, if you miss three or more appointments without notifying the office, you may be dismissed from the clinic at the provider's discretion.      For prescription refill requests, have your pharmacy contact our office and allow 72 hours for refills to be completed.    Today you received the following chemotherapy and/or immunotherapy agents : Kanjinti.      To help prevent nausea and vomiting after your treatment, we encourage you to take your nausea medication as directed.  BELOW ARE SYMPTOMS THAT SHOULD BE REPORTED IMMEDIATELY: *FEVER GREATER THAN 100.4 F (38 C) OR HIGHER *CHILLS OR SWEATING *NAUSEA AND VOMITING THAT IS NOT CONTROLLED WITH YOUR NAUSEA MEDICATION *UNUSUAL SHORTNESS OF BREATH *UNUSUAL BRUISING OR BLEEDING *URINARY PROBLEMS (pain or burning when urinating, or frequent urination) *BOWEL PROBLEMS (unusual diarrhea, constipation, pain near the anus) TENDERNESS IN MOUTH AND THROAT WITH OR WITHOUT PRESENCE OF ULCERS (sore throat, sores in mouth, or a toothache) UNUSUAL RASH, SWELLING OR PAIN  UNUSUAL VAGINAL DISCHARGE OR ITCHING   Items with * indicate a potential emergency and should be followed up as soon as possible or go to the Emergency Department if any problems should occur.  Please show the CHEMOTHERAPY ALERT CARD or  IMMUNOTHERAPY ALERT CARD at check-in to the Emergency Department and triage nurse.  Should you have questions after your visit or need to cancel or reschedule your appointment, please contact [facility] - A DEPT OF [facility]  Dept: [phone]  and follow the prompts.  Office hours are 8:00 a.m. to 4:30 p.m. Monday - Friday. Please note that voicemails left after 4:00 p.m. may not be returned until the following business day.  We are closed weekends and major holidays. You have access to a nurse at all times for urgent questions. Please call the main number to the clinic Dept: [phone] and follow the prompts.   For any non-urgent questions, you may also contact your provider using MyChart. We now offer e-Visits for anyone 19 and older to request care online for non-urgent symptoms. For details visit [email or web address].   Also download the MyChart app! Go to the app store, search "MyChart", open the app, select [place], and log in with your MyChart username and password.

## 2023-06-06 NOTE — Assessment & Plan Note (Signed)
Melissa [name] is a 57 year-old woman with HER2+ metastatic breast cancer here today for f/u prior to receiving herceptin.    Metastatic Breast Cancer Stable disease on Herceptin. Hemoptysis that led to discontinuation of Avastin has since resolved.  -Continue Herceptin. -CT chest, abdomen, pelvis scheduled for 06/20/2023 to monitor disease status. -MRI brain scheduled for 06/15/2023 to monitor disease status. -Follow-up in 3 weeks. -f/u with Dr. Barbaraann Cao about brain MRI.   Cardiac Function Normal EF on last echo in June. -Order repeat echo to monitor cardiac function.  Skin Lesions New skin lesions with itching and callus-like appearance. No family history of similar rash. No improvement with over-the-counter treatments. -Sent urgent referral to dermatology for evaluation and possible biopsy. -Prescribed Triamcinolone ointment, to be picked up at [facility].  Chronic Cough Daily coughing fits, sometimes leading to vomiting. No recent changes in cough pattern since starting Cresemba 2 weeks ago for mold infection. -Continue Cresemba and monitor response. -CT scan will provide additional information on lung status. - Continue Follow up with Dr. Algis Liming in infectious disease  Dry Skin Severe dry skin on face and neck, not improved with multiple moisturizers and scrubs. -Consider evaluation by dermatology during upcoming visit for skin lesions.

## 2023-06-07 ENCOUNTER — Other Ambulatory Visit (HOSPITAL_COMMUNITY): Payer: Self-pay

## 2023-06-14 ENCOUNTER — Other Ambulatory Visit: Payer: Self-pay | Admitting: *Deleted

## 2023-06-14 ENCOUNTER — Other Ambulatory Visit: Payer: Self-pay | Admitting: Radiation Therapy

## 2023-06-14 ENCOUNTER — Encounter: Payer: Self-pay | Admitting: Hematology and Oncology

## 2023-06-14 MED ORDER — PREDNISONE 50 MG PO TABS: ORAL_TABLET | ORAL | 1 refills | Status: DC

## 2023-06-14 NOTE — Progress Notes (Signed)
Due to pt hx of allergic reaction to IV contrast verbal orders received from MD for pt to receive 13 hour prep with 50 mg p.o prednisone and 50 mg p.o OTC benadryl. Pt educated to take 1 tablet of p.o prednisone 13 hours prior to scan, 2nd tablet of prednisone 7 hours prior to scan and 3rd tablet of prednisone 1 hour prior to scan.  Pt also educated to take 50 mg p.o OTC benadryl 1 hours prior to the scan.  Prednisone prescription sent to pharmacy on file, pt educated and verbalized understanding.   

## 2023-06-15 ENCOUNTER — Encounter: Payer: Self-pay | Admitting: Hematology and Oncology

## 2023-06-15 ENCOUNTER — Ambulatory Visit
Admission: RE | Admit: 2023-06-15 | Discharge: 2023-06-15 | Disposition: A | Discharge status: Home / Self Care | Payer: Medicare Other | Source: Ambulatory Visit | Attending: Internal Medicine | Admitting: Internal Medicine

## 2023-06-15 DIAGNOSIS — C7931 Secondary malignant neoplasm of brain: Secondary | ICD-10-CM

## 2023-06-15 MED ORDER — SODIUM CHLORIDE 0.9% FLUSH
10.0000 mL | INTRAVENOUS | Status: DC | PRN
  Administered 2023-06-15: 10 mL via INTRAVENOUS

## 2023-06-15 MED ORDER — GADOPICLENOL 0.5 MMOL/ML IV SOLN
5.0000 mL | Freq: Once | INTRAVENOUS | Status: AC | PRN
  Administered 2023-06-15: 5 mL via INTRAVENOUS

## 2023-06-15 MED ORDER — HEPARIN SOD (PORK) LOCK FLUSH 100 UNIT/ML IV SOLN: 500.0000 [IU] | Freq: Once | INTRAVENOUS | Status: DC

## 2023-06-19 ENCOUNTER — Inpatient Hospital Stay: Payer: Medicare Other | Attending: Hematology and Oncology | Admitting: Internal Medicine

## 2023-06-19 VITALS — BP 119/66 | HR 87 | Temp 98.1°F | Resp 18 | Wt 109.6 lb

## 2023-06-19 DIAGNOSIS — Z170 Estrogen receptor positive status [ER+]: Secondary | ICD-10-CM | POA: Diagnosis not present

## 2023-06-19 DIAGNOSIS — Z5112 Encounter for antineoplastic immunotherapy: Secondary | ICD-10-CM | POA: Insufficient documentation

## 2023-06-19 DIAGNOSIS — Z79899 Other long term (current) drug therapy: Secondary | ICD-10-CM | POA: Diagnosis not present

## 2023-06-19 DIAGNOSIS — C7931 Secondary malignant neoplasm of brain: Secondary | ICD-10-CM

## 2023-06-19 DIAGNOSIS — C50212 Malignant neoplasm of upper-inner quadrant of left female breast: Secondary | ICD-10-CM | POA: Diagnosis present

## 2023-06-19 NOTE — Progress Notes (Signed)
[facility] [address]  [place], [place] [address] [phone]   Interval Evaluation  Date of Service: 06/19/23 Patient Name: Melissa [name] Patient MRN: [id number] Patient DOB: 04-17-1966 Provider: Henreitta Leber, MD  Identifying Statement:  Melissa [name] is a 57 y.o. 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 Ki-67 was 70% HER-2 was amplified with a ratio 2.91   07/12/2012 - 07/17/2013 Neo-Adjuvant Chemotherapy   TCH 6 followed by Herceptin maintenance   12/11/2012 Surgery   Left breast lumpectomy: 1.8 cm tumor 1 positive sentinel node, axillary lymph node dissection 02/08/2013 showed 0/13 lymph nodes   03/25/2013 - 05/06/2013 Radiation Therapy   Adjuvant radiation therapy   06/05/2013 - 07/20/2017 Anti-estrogen oral therapy   Tamoxifen 20 mg daily   07/27/2017 Relapse/Recurrence   MRI Brain: 3.4 x 2.9 x 2.9 cm RIGHT frontal lobe mass with imaging characteristics of solitary metastasis. Extensive vasogenic edema resulting in 9 mm RIGHT to LEFT midline shift. Equivocal very early LEFT ventricle entrapment.    08/04/2017 Surgery   Rt frontal brain resection: Poorly differentiated tumor IHC suggests breast primary ER and PR Positive   08/25/2017 - 09/04/2017 Radiation Therapy   Stereotactic radiation   09/18/2017 -  Anti-estrogen oral therapy   Lapatinib with letrozole   12/18/2017 - 12/19/2017 Radiation Therapy   New right parietal lobe metastases status post [facility]   08/27/2018 - 08/27/2020 Radiation Therapy   SRS to new brain metastases   05/09/2019 Relapse/Recurrence   Interval increase in size of the enhancing  nodular left internal mammary soft tissue 2.8 cm.  Redemonstrated enlarged supraclavicular, lower cervical and lower posterior cervical nodes unchanged.  Interval increase in the bony erosion of the posterior and lateral left third rib, increasing soft tissue lesion eroding the left sternal body 3.1 cm was 2.5 cm.  Bronchiectatic changes   06/19/2019 - 05/05/2020 Chemotherapy   ado-trastuzumab emtansine (KADCYLA)     06/15/2020 -  Chemotherapy   Xeloda, Tucatinib, Herceptin    04/12/2022 - 09/06/2022 Chemotherapy   Patient is on Treatment Plan : BREAST Trastuzumab IV (8/6) or SQ (600) D1 q21d     09/27/2022 -  Chemotherapy   Patient is on Treatment Plan : BREAST MAINTENANCE Trastuzumab IV (6) or SQ (600) D1 q21d x 13 cycles. Added bevacizumab (10) q21d starting cycle 8.     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 Added bevacizumab (10) q21d starting cycle 8.     Port-A-Cath in place   CNS Oncologic History 02/20/23: SRS to pons recurrence, R temporal rsxn cavity.  Concurrent  avastin initiated q3 weeks 12/29/22: Craniotomy, resection R temporal (Fecci).  Path is 70% neoplasm 11/04/22: Post-op frx SRS L occipital Kathrynn Running) 10/06/22: Craniotomy, resection L occipital at [facility]), path is neoplasm 10/12/21: SRS to pons 16 Gy Kathrynn Running) 06/29/21: SRS to 2x L occipital 03/17/21: SRS x3 08/27/20: SRS x5 05/13/20: SRS x4  03/25/19: SRS  right frontal 5 mm 09/04/18: SRS x2 right frontal 12/18/17: SRS R parietal 09/04/17: Post op SRS R frontal, 5 fxs 08/04/17: Craniotomy, resection R frontal met ([name])  Interval History: Melissa [name] presents today for follow up after recent MRI brain.  Last avastin was administered in early October.  She feels well today, still has right sided visual impairment, left sided facial numbness.  In recent months had had worsening mood swings, irritability.  No other new or progressive changes.  Prior: Marland KitchenMarland KitchenNow having completed SRS for recurrent brainstem metastasis and right temporal resection cavity.   She describes several weeks history of "coughing up blood, including large clots".  She has been evaluated by pulmonology, etiology is unclear.  Does report an increase in fatigue and lethargy since symptom onset.  Ohterwise no new or progressive neurologic complaints; she continues to experience numbness and tingling in her left hand since surgery in June.  Walking independently, no seizures or headaches.  Prior: She describes some lingering tingling of fingertips in her left hand, but this has actually improved in recent days.  There are still head pain sensations following surgery, described as "lightning bolt" feeling which lasts for 3-5 seconds, occurs 2-3x per hour.  This has not improved with Tylenol or other pain medications.  Otherwise no new or progressive neurologic symptoms, remains functionally intact and independent with gait.  Continues on Kanjinti with Dr. Pamelia Hoit, tolerating it well.  Planning upcoming trip to beach with new camper.   She describes recurrence of daily headaches since surgery.  Has been dosing Tylenol or Aleve daily.  Sleep has been poor at times.  Steroids had been discontinued.  Her right sided visual symptoms have not recurred.  Otherwise no new or progressive changes.  H+P (08/02/22) Patient presents today for headache evaluation.  She describes pain "along the temples" mild  to moderate, every day for the past month.  Prior to this she had very sporadic migraine type headaches, which these differ from.  No associated neurologic deficits.  She does dose codeine based cough syrup every night (for the past year), and has chronic sleep impairment.  Decadron 2mg  twice per day, started last week, has not helped any symptoms.  Also describes some "shadowy visual loss" on the right side, at times.  Continues on herceptin with Dr. Pamelia Hoit.  Medications: Current Outpatient Medications on File Prior to Visit  Medication Sig Dispense Refill   acetaminophen (TYLENOL) 325 MG tablet Take 2 tablets (650 mg total) by mouth every 6 (six) hours as needed for mild pain (or Fever >/= 101). 12 tablet 0   albuterol (VENTOLIN HFA) 108 (90 Base) MCG/ACT inhaler Inhale 2 puffs into the lungs every 6 (six) hours as needed for wheezing or shortness of breath (cough). 18 g 5   calcium carbonate (TUMS EX) 750 MG chewable tablet Chew by mouth.     chlorpheniramine-HYDROcodone (TUSSIONEX) 10-8 MG/5ML Take 5 mLs by mouth 2 (two) times daily. 115 mL 0   cycloSPORINE (RESTASIS) 0.05 % ophthalmic emulsion Place 1 drop into the right eye.     Fluticasone-Salmeterol,sensor, (AIRDUO DIGIHALER) 113-14 MCG/ACT AEPB Inhale 1 puff into the lungs 2 (  two) times daily. 1 each 4   gabapentin (NEURONTIN) 100 MG capsule Take 1 capsule (100 mg total) by mouth 2 (two) times daily. 120 capsule 1   GARLIC PO Take by mouth.     Isavuconazonium Sulfate (CRESEMBA) 186 MG CAPS Take 3 capsules (558 mg total) by mouth every 8 (eight) hours for 3 days, THEN 2 capsules (372 mg total) daily. 68 capsule 11   levalbuterol (XOPENEX) 0.63 MG/3ML nebulizer solution Take 3 mLs (0.63 mg total) by nebulization every 4 (four) hours as needed for wheezing or shortness of breath. 90 mL 12   lidocaine-prilocaine (EMLA) cream APPLY EXTERNALLY TO THE AFFECTED AREA 1 TIME 30 g 3   Multiple Vitamins-Minerals (EMERGEN-C VITAMIN C PO) Take by mouth.      naproxen sodium (ALEVE) 220 MG tablet Take by mouth.     OIL OF OREGANO PO Take 500 mg by mouth 2 (two) times daily.     ondansetron (ZOFRAN-ODT) 4 MG disintegrating tablet Take 4 mg by mouth every 8 (eight) hours as needed.     predniSONE (DELTASONE) 50 MG tablet Take 1 tablet 13 hrs prior to scan, 2nd tablet 7 hrs prior, 3rd tablet 1 hr prior 3 tablet 1   SYMBICORT 160-4.5 MCG/ACT inhaler Inhale 2 puffs into the lungs 2 (two) times daily.     triamcinolone ointment (KENALOG) 0.5 % Apply 1 Application topically 2 (two) times daily. 30 g 0   Vitamin E (VITAMIN E/D-ALPHA NATURAL) 268 MG (400 UNIT) CAPS Take by mouth.     Current Facility-Administered Medications on File Prior to Visit  Medication Dose Route Frequency Provider Last Rate Last Admin   alteplase (CATHFLO ACTIVASE) injection 2 mg  2 mg Intracatheter Once PRN Serena Croissant, MD       sodium chloride flush (NS) 0.9 % injection 10 mL  10 mL Intracatheter PRN Serena Croissant, MD   10 mL at 07/06/21 1428    Allergies:  Allergies  Allergen Reactions   Aspirin Anaphylaxis and Shortness Of Breath    THROAT CLOSES   Protonix [Pantoprazole] Nausea Only and Other (See Comments)    Also caused a "film in the mouth" and caused chest pressure   Doxycycline Other (See Comments)    Hallucinations, headaches   Promethazine-Codeine Cough    Worsening cough and insomnia    Sulfamethoxazole-Trimethoprim Other (See Comments)   Iodinated Contrast Media Rash    Patient allergic to contrast used in radiation oncology for CT simulation    Past Medical History:  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)    2019   Cancer, metastatic to liver (HCC)    2021   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)     Complication of anesthesia    bp dropped + desat    COPD (chronic obstructive pulmonary disease) (HCC)    Dyspnea    DOE   GERD (gastroesophageal reflux disease)    H/O coccidioidomycosis    was reason for lung lobectomy   Headache(784.0)    due to eye strain or not eating   Hemoptysis 05/08/2023   History of anemia    no current problem   History of asthma  as a child   History of breast cancer 2014   left   History of chemotherapy    finished 07/17/2013   History of hiatal hernia    AGE 69   Hx of radiation therapy 03/25/13-05/06/13   left breast 5000 cGy/25 sessions, left breast boost 1000 cGy/5 sessions   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   Personal history of chemotherapy    for liver cancer   Personal history of radiation therapy    Pneumonia    LAST FLARE UP 01/2018   Rash 02/22/2021   Runny nose 07/30/2013   clear drainage   Smoker 05/24/2021   Wears dentures    upper   Past Surgical History:  Past Surgical History:  Procedure Laterality Date   APPLICATION OF CRANIAL NAVIGATION N/A 08/04/2017   Procedure: APPLICATION OF CRANIAL NAVIGATION;  Surgeon: [name], Loura Halt, MD;  Location: [facility] OR;  Service: Neurosurgery;  Laterality: N/A;   AXILLARY LYMPH NODE DISSECTION Left 02/08/2013   Procedure: LEFT AXILLARY DISSECTION;  Surgeon: Mariella Saa, MD;  Location: [facility];  Service: General;  Laterality: Left;   BREAST CYST EXCISION Right 2006   BREAST LUMPECTOMY Left 2014   BREAST LUMPECTOMY WITH NEEDLE LOCALIZATION AND AXILLARY SENTINEL LYMPH NODE BX Left 12/31/2012   Procedure: NEEDLE LOCALIZATION LEFT BREAST LUMPECTOMY AND LEFT AXILLARY SENTENIAL LYMPH NODE BX;  Surgeon: Mariella Saa, MD;  Location: [facility];  Service: General;  Laterality: Left;   BRONCHIAL BRUSHINGS  03/17/2020   Procedure: BRONCHIAL BRUSHINGS;  Surgeon: Josephine Igo, DO;  Location: [facility];  Service: Pulmonary;;   BRONCHIAL WASHINGS  03/17/2020   Procedure:  BRONCHIAL WASHINGS;  Surgeon: Josephine Igo, DO;  Location: [facility];  Service: Pulmonary;;   CESAREAN SECTION  1995/1996   CRANIOTOMY Right 08/04/2017   Procedure: Right Frontal craniotomy for resection of tumor with stereotactic navigation;  Surgeon: [name], Loura Halt, MD;  Location: [facility] OR;  Service: Neurosurgery;  Laterality: Right;  Right Frontal craniotomy for resection of tumor with stereotactic navigation   IR CV LINE INJECTION  02/19/2021   IR CV LINE INJECTION  11/12/2021   IR IMAGING GUIDED PORT INSERTION  05/31/2019   LUNG LOBECTOMY Left 05/1996   upper lobe - due to [facility] Fever   PORT-A-CATH REMOVAL Right 08/02/2013   Procedure: REMOVAL PORT-A-CATH;  Surgeon: Mariella Saa, MD;  Location: [facility];  Service: General;  Laterality: Right;   PORTACATH PLACEMENT  07/02/2012   Procedure: INSERTION PORT-A-CATH;  Surgeon: Mariella Saa, MD;  Location: [facility];  Service: General;  Laterality: N/A;  right   VIDEO BRONCHOSCOPY N/A 03/17/2020   Procedure: VIDEO BRONCHOSCOPY;  Surgeon: Josephine Igo, DO;  Location: [facility];  Service: Pulmonary;  Laterality: N/A;   VIDEO BRONCHOSCOPY WITH ENDOBRONCHIAL ULTRASOUND N/A 05/23/2018   Procedure: VIDEO BRONCHOSCOPY WITH ENDOBRONCHIAL ULTRASOUND;  Surgeon: Josephine Igo, DO;  Location: [facility] OR;  Service: Thoracic;  Laterality: N/A;   Social History:  Social History   Socioeconomic History   Marital status: Married    Spouse name: Not on file   Number of children: 2   Years of education: Not on file   Highest education level: Not on file  Occupational History   Occupation: Investment banker, corporate  Tobacco Use   Smoking status: Every Day    Current packs/day: 0.30    Average packs/day: 0.3 packs/day for 38.0 years (11.4 ttl pk-yrs)    Types: Cigarettes   Smokeless  tobacco: Never   Tobacco comments:    12 cigarettes a day-05/08/23  Vaping Use   Vaping status: Never Used  Substance and  Sexual Activity   Alcohol use: No   Drug use: No   Sexual activity: Not Currently  Other Topics Concern   Not on file  Social History Narrative   Not on file   Social Determinants of Health   Financial Resource Strain: Low Risk  (01/03/2023)   [facility], [facility]   Overall Financial Resource Strain (CARDIA)    Difficulty of Paying Living Expenses: Not hard at all  Food Insecurity: No Food Insecurity (01/30/2023)   Hunger Vital Sign    Worried About Running Out of Food in the Last Year: Never true    Ran Out of Food in the Last Year: Never true  Transportation Needs: No Transportation Needs (01/30/2023)   PRAPARE - Administrator, Civil Service (Medical): No    Lack of Transportation (Non-Medical): No  Physical Activity: Not on file  Stress: Not on file  Social Connections: Not on file  Intimate Partner Violence: Not At Risk (01/30/2023)   Humiliation, Afraid, Rape, and Kick questionnaire    Fear of Current or Ex-Partner: No    Emotionally Abused: No    Physically Abused: No    Sexually Abused: No   Family History:  Family History  Problem Relation Age of Onset   Emphysema Mother        was a smoker   Heart disease Mother    Melanoma Mother        dx in her 87s   Breast cancer Mother 53   Breast cancer Cousin 52 - 47       mother's maternal cousin   Asthma Brother     Review of Systems: Constitutional: Doesn't report fevers, chills or abnormal weight loss Eyes: Doesn't report blurriness of vision Ears, nose, mouth, throat, and face: Doesn't report sore throat Respiratory: Doesn't report cough, dyspnea or wheezes Cardiovascular: Doesn't report palpitation, chest discomfort  Gastrointestinal:  Doesn't report nausea, constipation, diarrhea GU: Doesn't report incontinence Skin: Doesn't report skin rashes Neurological: Per HPI Musculoskeletal: Doesn't report joint pain Behavioral/Psych: Doesn't report  anxiety  Physical Exam: Vitals:   06/19/23 0858  BP: 119/66  Pulse: 87  Resp: 18  Temp: 98.1 F (36.7 C)  SpO2: 98%    KPS: 80. General: Alert, cooperative, pleasant, in no acute distress Head: Normal EENT: No conjunctival injection or scleral icterus.  Lungs: Resp effort normal Cardiac: Regular rate Abdomen: Non-distended abdomen Skin: No rashes cyanosis or petechiae. Extremities: No clubbing or edema  Neurologic Exam: Mental Status: Awake, alert, attentive to examiner. Oriented to self and environment. Language is fluent with intact comprehension.  Cranial Nerves: Visual acuity is grossly normal. Visual fields are full. Extra-ocular movements intact. No ptosis. Face is symmetric Motor: Tone and bulk are normal. Power is full in both arms and legs. Reflexes are symmetric, no pathologic reflexes present.  Sensory: Intact to light touch Gait: Normal.   Labs: I have reviewed the data as listed    Component Value Date/Time   NA 136 03/28/2023 1051   NA 142 08/12/2014 0956   K 4.7 03/28/2023 1051   K 4.6 08/12/2014 0956   CL 101 03/28/2023 1051   CL 100 12/20/2012 1509   CO2 24 03/28/2023 1051   CO2 26 08/12/2014 0956   GLUCOSE 84 03/28/2023 1051   GLUCOSE 94  08/12/2014 0956   GLUCOSE 98 12/20/2012 1509   BUN 9 03/28/2023 1051   BUN 10.1 08/12/2014 0956   CREATININE 0.83 03/28/2023 1051   CREATININE 0.9 08/12/2014 0956   CALCIUM 9.3 03/28/2023 1051   CALCIUM 9.3 08/12/2014 0956   PROT 6.3 03/28/2023 1051   PROT 7.1 08/12/2014 0956   ALBUMIN 3.6 11/29/2022 0939   ALBUMIN 3.8 08/12/2014 0956   AST 13 03/28/2023 1051   AST 13 (L) 11/29/2022 0939   AST 15 08/12/2014 0956   ALT 13 03/28/2023 1051   ALT 14 11/29/2022 0939   ALT 11 08/12/2014 0956   ALKPHOS 62 11/29/2022 0939   ALKPHOS 68 08/12/2014 0956   BILITOT 0.2 03/28/2023 1051   BILITOT 0.2 (L) 11/29/2022 0939   BILITOT 0.25 08/12/2014 0956   GFRNONAA >60 11/29/2022 0939   GFRNONAA 76 03/11/2019 1057    GFRAA >60 03/24/2020 1012   GFRAA >60 02/11/2020 0836   GFRAA 88 03/11/2019 1057   Lab Results  Component Value Date   WBC 12.0 (H) 06/06/2023   NEUTROABS 8.2 (H) 06/06/2023   HGB 9.9 (L) 06/06/2023   HCT 31.8 (L) 06/06/2023   MCV 85.5 06/06/2023   PLT 473 (H) 06/06/2023    Imaging:  MR BRAIN W WO CONTRAST  Result Date: 06/15/2023 CLINICAL DATA:  CNS neoplasm, assess treatment response EXAM: MRI HEAD WITHOUT AND WITH CONTRAST TECHNIQUE: Multiplanar, multiecho pulse sequences of the brain and surrounding structures were obtained without and with intravenous contrast. Contrast was administered via a port which was accessed by a Designer, jewellery. CONTRAST:  5 mL Vueway COMPARISON:  04/13/2023, 02/01/2023 FINDINGS: Brain: Increased nodular enhancement about the right posterior frontal resection cavity (series 14, image 133 and series 15, image 23), most prominent along the posterior and inferior margins of the cavity. Wall enhancement appears increased compared to the most recent exam, this enhancement appears similar to 02/01/2023. Surrounding T2 hyperintense signal has increased. Slightly increased enhancement along the medial aspect of the anterior right frontal lobe resection cavity (series 14, image 108) compared to 04/13/2023, but similar to 02/01/2023. Surrounding T2 hyperintense signal in largely unchanged. Overall unchanged size and slightly increased enhancement in the right medial orbital frontal gyrus lesion, which now measures up to 5 x 6 x 10 mm (AP x TR x CC) (series 14, image 81 and series 15, image 37), previously 5 x 6 x 9 mm on 04/13/2023. Enhancement appears unchanged compared to 02/01/2023. Slightly increased surrounding T2 hyperintense signal. Unchanged minimal enhancement along the superior and inferior margins of the left occipital resection cavity (series 14, images 65 and 82). Unchanged surrounding T2 hyperintense signal. Overall unchanged enhancement along the margins of the  right temporal lobe resection cavity (series 14, image 63). Unchanged associated T2 hyperintense signal. Increased nodular enhancement at the superior aspect of the cerebellar vermis (series 14, image 59) compared to 04/13/2023, but unchanged compared to 02/01/2023. Mildly increased associated T2 hyperintense signal. Overall unchanged size but increased enhancement in the 7 x 10 x 8 mm enhancing lesion in the pons (AP x TR x CC) (series 14, image 50 and series 15, image 21), previously 6 x 9 x 8 mm on 04/13/2023 and 9 x 13 x 10 mm on 02/01/2023. Increased associated T2 hyperintense signal. No new enhancing lesions are seen. No restricted diffusion to suggest acute or subacute infarct. No acute hemorrhage, midline shift, or hydrocephalus. Unchanged small pseudomeningocele overlying the right temporal craniotomy flap. Vascular: Normal arterial flow voids. Normal arterial and  venous enhancement. Skull and upper cervical spine: Prior left occipital, right temporal, and right frontal craniotomies. Otherwise normal marrow signal. Sinuses/Orbits: No acute finding. IMPRESSION: 1. Enhancement associated with the right posterior frontal resection cavity, anterior right frontal lobe resection cavity, right medial orbital gyrus lesion, and superior cerebellar vermis is increased compared to the 04/13/2023 exam, but appears unchanged compared to the 02/01/2023 exam, which suggests a technical etiology for the differences in enhancement; however surrounding T2 hyperintense signal is increased for the right posterior frontal resection cavity, orbital gyrus lesion, and cerebellar vermis. This could be related to recent treatment or reflect worsening disease. Attention on follow-up. 2. The pontine lesion demonstrates increased nodular enhancement compared to 04/13/2023, but is overall unchanged in size compared to 04/12/2021 and decreased in size and enhancement compared to 02/01/2023, with slightly increased surrounding edema. 3.  Unchanged enhancement and surrounding T2 hyperintense signal associated with the left occipital resection cavity and margins of the right temporal lobe resection cavity. 4. No new enhancing lesions are seen. Electronically Signed   By: Wiliam Ke M.D.   On: 06/15/2023 12:00   MM 3D SCREENING MAMMOGRAM BILATERAL BREAST  Result Date: 05/26/2023 CLINICAL DATA:  Screening. EXAM: DIGITAL SCREENING BILATERAL MAMMOGRAM WITH TOMOSYNTHESIS AND CAD TECHNIQUE: Bilateral screening digital craniocaudal and mediolateral oblique mammograms were obtained. Bilateral screening digital breast tomosynthesis was performed. The images were evaluated with computer-aided detection. COMPARISON:  Previous exam(s). ACR Breast Density Category c: The breasts are heterogeneously dense, which may obscure small masses. FINDINGS: There are no findings suspicious for malignancy. IMPRESSION: No mammographic evidence of malignancy. A result letter of this screening mammogram will be mailed directly to the patient. RECOMMENDATION: Screening mammogram in one year. (Code:SM-B-01Y) BI-RADS CATEGORY  1: Negative. Electronically Signed   By: Amie Portland M.D.   On: 05/26/2023 12:38    [facility] Clinician Interpretation: I have personally reviewed the radiological images as listed.  My interpretation, in the context of the patient's clinical presentation, is stable disease   Assessment/Plan Metastasis to brain [facility])  Melissa [name] is clinically stable today from focal neurologic standpiont.  MRI brain demonstrates increased enhancement with pontine lesion and several others, but this is stable when compared to pre-avastin scan in July.    Though she remains at high risk for post-radiation inflammatory change, radiation necrosis, we recommended remaining off avastin and steroids for now.  Can utilize corticosteroids if focal deficits develop from post-RT inflammation given hemoptysis from avastin.  Discussed trial of Lamictal for frontal  lobe ass'd mood changes.  She plans to start antidepressant with her PCP first, will defer Lamictal for now.    We appreciate the opportunity to participate in the care of Melissa [name].   We ask that Melissa [name] return to clinic in 3 months following next MRI brain, or sooner if needed.  All questions were answered. The patient knows to call the clinic with any problems, questions or concerns. No barriers to learning were detected.  The total time spent in the encounter was 40 minutes and more than 50% was on counseling and review of test results   Henreitta Leber, MD Medical Director of Neuro-Oncology [facility] 06/19/23 9:01 AM

## 2023-06-20 ENCOUNTER — Other Ambulatory Visit: Payer: Self-pay

## 2023-06-20 ENCOUNTER — Other Ambulatory Visit: Payer: Self-pay | Admitting: Hematology and Oncology

## 2023-06-20 ENCOUNTER — Ambulatory Visit (HOSPITAL_COMMUNITY)
Admission: RE | Admit: 2023-06-20 | Discharge: 2023-06-20 | Disposition: A | Discharge status: Home / Self Care | Payer: Medicare Other | Source: Ambulatory Visit | Attending: Hematology and Oncology | Admitting: Hematology and Oncology

## 2023-06-20 DIAGNOSIS — C7931 Secondary malignant neoplasm of brain: Secondary | ICD-10-CM | POA: Insufficient documentation

## 2023-06-20 DIAGNOSIS — C50212 Malignant neoplasm of upper-inner quadrant of left female breast: Secondary | ICD-10-CM | POA: Insufficient documentation

## 2023-06-20 DIAGNOSIS — C50912 Malignant neoplasm of unspecified site of left female breast: Secondary | ICD-10-CM | POA: Insufficient documentation

## 2023-06-20 DIAGNOSIS — Z171 Estrogen receptor negative status [ER-]: Secondary | ICD-10-CM | POA: Insufficient documentation

## 2023-06-20 MED ORDER — HEPARIN SOD (PORK) LOCK FLUSH 100 UNIT/ML IV SOLN
500.0000 [IU] | Freq: Once | INTRAVENOUS | Status: AC
  Administered 2023-06-20: 500 [IU] via INTRAVENOUS

## 2023-06-20 MED ORDER — IOHEXOL 300 MG/ML  SOLN
100.0000 mL | Freq: Once | INTRAMUSCULAR | Status: AC | PRN
  Administered 2023-06-20: 100 mL via INTRAVENOUS

## 2023-06-21 ENCOUNTER — Ambulatory Visit (HOSPITAL_COMMUNITY)
Admission: RE | Admit: 2023-06-21 | Discharge: 2023-06-21 | Disposition: A | Discharge status: Home / Self Care | Payer: Medicare Other | Source: Ambulatory Visit | Attending: Adult Health | Admitting: Adult Health

## 2023-06-21 DIAGNOSIS — Z01818 Encounter for other preprocedural examination: Secondary | ICD-10-CM | POA: Insufficient documentation

## 2023-06-21 DIAGNOSIS — R0602 Shortness of breath: Secondary | ICD-10-CM

## 2023-06-21 DIAGNOSIS — Z171 Estrogen receptor negative status [ER-]: Secondary | ICD-10-CM | POA: Diagnosis not present

## 2023-06-21 DIAGNOSIS — C50212 Malignant neoplasm of upper-inner quadrant of left female breast: Secondary | ICD-10-CM

## 2023-06-21 LAB — ECHOCARDIOGRAM COMPLETE
Area-P 1/2: 3.54 cm2
S' Lateral: 3 cm

## 2023-06-21 MED ORDER — HYDROCOD POLI-CHLORPHE POLI ER 10-8 MG/5ML PO SUER: 5.0000 mL | Freq: Two times a day (BID) | ORAL | 0 refills | Status: DC

## 2023-06-27 ENCOUNTER — Inpatient Hospital Stay: Payer: Medicare Other

## 2023-06-27 ENCOUNTER — Inpatient Hospital Stay (HOSPITAL_BASED_OUTPATIENT_CLINIC_OR_DEPARTMENT_OTHER): Payer: Medicare Other | Admitting: Adult Health

## 2023-06-27 VITALS — BP 118/78 | HR 78 | Temp 98.2°F | Resp 18 | Wt 110.1 lb

## 2023-06-27 DIAGNOSIS — C50212 Malignant neoplasm of upper-inner quadrant of left female breast: Secondary | ICD-10-CM

## 2023-06-27 DIAGNOSIS — Z171 Estrogen receptor negative status [ER-]: Secondary | ICD-10-CM | POA: Diagnosis not present

## 2023-06-27 DIAGNOSIS — Z5112 Encounter for antineoplastic immunotherapy: Secondary | ICD-10-CM | POA: Diagnosis not present

## 2023-06-27 DIAGNOSIS — C50912 Malignant neoplasm of unspecified site of left female breast: Secondary | ICD-10-CM

## 2023-06-27 DIAGNOSIS — Z95828 Presence of other vascular implants and grafts: Secondary | ICD-10-CM

## 2023-06-27 LAB — CBC WITH DIFFERENTIAL (CANCER CENTER ONLY)
Abs Immature Granulocytes: 0.02 10*3/uL (ref 0.00–0.07)
Basophils Absolute: 0.1 10*3/uL (ref 0.0–0.1)
Basophils Relative: 1 %
Eosinophils Absolute: 1 10*3/uL — ABNORMAL HIGH (ref 0.0–0.5)
Eosinophils Relative: 10 %
HCT: 31.4 % — ABNORMAL LOW (ref 36.0–46.0)
Hemoglobin: 9.9 g/dL — ABNORMAL LOW (ref 12.0–15.0)
Immature Granulocytes: 0 %
Lymphocytes Relative: 23 %
Lymphs Abs: 2.2 10*3/uL (ref 0.7–4.0)
MCH: 27.1 pg (ref 26.0–34.0)
MCHC: 31.5 g/dL (ref 30.0–36.0)
MCV: 86 fL (ref 80.0–100.0)
Monocytes Absolute: 0.9 10*3/uL (ref 0.1–1.0)
Monocytes Relative: 9 %
Neutro Abs: 5.7 10*3/uL (ref 1.7–7.7)
Neutrophils Relative %: 57 %
Platelet Count: 478 10*3/uL — ABNORMAL HIGH (ref 150–400)
RBC: 3.65 MIL/uL — ABNORMAL LOW (ref 3.87–5.11)
RDW: 19.1 % — ABNORMAL HIGH (ref 11.5–15.5)
WBC Count: 9.9 10*3/uL (ref 4.0–10.5)
nRBC: 0 % (ref 0.0–0.2)

## 2023-06-27 LAB — TOTAL PROTEIN, URINE DIPSTICK: Protein, ur: NEGATIVE mg/dL

## 2023-06-27 MED ORDER — SODIUM CHLORIDE 0.9 % IV SOLN: Freq: Once | INTRAVENOUS | Status: AC

## 2023-06-27 MED ORDER — SODIUM CHLORIDE 0.9% FLUSH: 10.0000 mL | INTRAVENOUS | Status: DC | PRN

## 2023-06-27 MED ORDER — ACETAMINOPHEN 325 MG PO TABS
650.0000 mg | ORAL_TABLET | Freq: Once | ORAL | Status: AC
  Administered 2023-06-27: 650 mg via ORAL
  Filled 2023-06-27: qty 2

## 2023-06-27 MED ORDER — DIPHENHYDRAMINE HCL 25 MG PO CAPS
25.0000 mg | ORAL_CAPSULE | Freq: Once | ORAL | Status: AC
  Administered 2023-06-27: 25 mg via ORAL
  Filled 2023-06-27: qty 1

## 2023-06-27 MED ORDER — HEPARIN SOD (PORK) LOCK FLUSH 100 UNIT/ML IV SOLN: 500.0000 [IU] | Freq: Once | INTRAVENOUS | Status: DC | PRN

## 2023-06-27 MED ORDER — TRASTUZUMAB-ANNS CHEMO 150 MG IV SOLR
6.0000 mg/kg | Freq: Once | INTRAVENOUS | Status: AC
  Administered 2023-06-27: 300 mg via INTRAVENOUS
  Filled 2023-06-27: qty 14.29

## 2023-06-27 MED ORDER — SODIUM CHLORIDE 0.9% FLUSH
10.0000 mL | INTRAVENOUS | Status: DC | PRN
  Administered 2023-06-27: 10 mL via INTRAVENOUS

## 2023-06-27 NOTE — Progress Notes (Signed)
[facility] Cancer Follow up:    Melissa Ates, MD 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 ---s/p Lumpectomy/ /Initial Ca 1997, Brain Mets 2019 Staging form: Breast, AJCC 7th Edition - Clinical: Stage IIA (T2, N0, cM0) - Unsigned Histopathologic type: 9931 Laterality: Left Tumor size (mm): 13 Staging comments: Staged at breast conference 12.4.13  - Pathologic: Stage IIA (T1c, N1, cM0) - Unsigned Histopathologic type: 9931 Laterality: Left Tumor size (mm): 13 - Pathologic: Stage IV (M1) - Signed by Loa Socks, NP on 12/20/2022   SUMMARY OF ONCOLOGIC HISTORY: Oncology History  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 Ki-67 was 70% HER-2 was amplified with a ratio 2.91   07/12/2012 - 07/17/2013 Neo-Adjuvant Chemotherapy   TCH 6 followed by Herceptin maintenance   12/11/2012 Surgery   Left breast lumpectomy: 1.8 cm tumor 1 positive sentinel node, axillary lymph node dissection 02/08/2013 showed 0/13 lymph nodes   03/25/2013 - 05/06/2013 Radiation Therapy   Adjuvant radiation therapy   06/05/2013 - 07/20/2017 Anti-estrogen oral therapy   Tamoxifen 20 mg daily   07/27/2017 Relapse/Recurrence   MRI Brain: 3.4 x 2.9 x 2.9 cm RIGHT frontal lobe mass with imaging characteristics of solitary metastasis. Extensive vasogenic edema resulting in 9 mm RIGHT to LEFT midline shift. Equivocal very early LEFT ventricle entrapment.    08/04/2017 Surgery   Rt frontal brain resection: Poorly differentiated tumor IHC suggests breast primary ER and PR Positive   08/25/2017 - 09/04/2017 Radiation Therapy   Stereotactic radiation    09/18/2017 -  Anti-estrogen oral therapy   Lapatinib with letrozole   12/18/2017 - 12/19/2017 Radiation Therapy   New right parietal lobe metastases status post [facility]   08/27/2018 - 08/27/2020 Radiation Therapy   SRS to new brain metastases   05/09/2019 Relapse/Recurrence   Interval increase in size of Melissa enhancing nodular left internal mammary soft tissue 2.8 cm.  Redemonstrated enlarged supraclavicular, lower cervical and lower posterior cervical nodes unchanged.  Interval increase in Melissa bony erosion of Melissa posterior and lateral left third rib, increasing soft tissue lesion eroding Melissa left sternal body 3.1 cm was 2.5 cm.  Bronchiectatic changes   06/19/2019 - 05/05/2020 Chemotherapy   ado-trastuzumab emtansine (KADCYLA)     06/15/2020 -  Chemotherapy   Xeloda, Tucatinib, Herceptin    04/12/2022 - 09/06/2022 Chemotherapy   [name] is on Treatment Plan : BREAST Trastuzumab IV (8/6) or SQ (600) D1 q21d     09/27/2022 -  Chemotherapy   [name] is on Treatment Plan : BREAST MAINTENANCE Trastuzumab IV (6) or SQ (600) D1 q21d x 13 cycles. Added bevacizumab (10) q21d starting cycle 8.     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 Added bevacizumab (10) q21d starting cycle 8.     Port-A-Cath in place    CURRENT THERAPY: Herceptin  INTERVAL HISTORY:  Discussed Melissa use of AI scribe software for clinical note transcription with Melissa [name], who  gave verbal consent to proceed.  Melissa [name] 57 y.o. female returns for f/u fo her metastatic breast cancer.  They express surprise and relief that no new findings were identified on their recent brain scan, Melissa first time in a long while that no new issues have been detected. They also note that their CT scan results appear to be improved, although they do not specify Melissa nature of Melissa improvement.  Melissa [name] reports feeling generally well, with no complaints of cough, shortness of breath, chest pain, or abnormal heart rhythms. They have been prescribed an antidepressant by another provider, which they have been taking for over a week. They note a positive change in their irritability levels since starting Melissa medication, although they do not specify Melissa name of Melissa drug.  Melissa [name] also discusses their family dynamics, including a father with early-stage Alzheimer's disease and a brother who plans to visit for Christmas. They express concern about their father's memory loss and Melissa potential for him to forget them.    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/Initial Ca 1997, Brain Mets 2019 06/10/2019   Mediastinal adenopathy    H/O coccidioidomycosis and Aspergillosus 05/16/2018   DOE (dyspnea on exertion) 05/03/2018   Diarrhea 04/22/2018   Cavitary lesion of lung in area of previous cyts in  apex of Sup Segment of LLL 03/29/2018   Aortic atherosclerosis (HCC) 01/22/2018   Emphysema lung (HCC) 01/22/2018   CAP (community acquired pneumonia) 11/23/2017   Metastasis to brain (HCC) 08/04/2017   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 (HCC) 07/27/2017   Nicotine abuse 07/27/2017   Migraines 07/27/2017   HCAP (healthcare-associated pneumonia) 11/26/2016   Upper airway cough syndrome 08/10/2016   Abnormal echocardiogram 08/07/2013   Chest tightness or pressure 08/07/2013   Hx of radiation therapy    Edema of left lower extremity 11/19/2012   Tachycardia 09/20/2012   Obstructive bronchiectasis (HCC) 01/26/2011   Cigarette smoker 01/26/2011    is allergic to aspirin, protonix [pantoprazole], doxycycline, promethazine-codeine, sulfamethoxazole-trimethoprim, and iodinated contrast media.  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)  Complication of anesthesia    bp dropped + desat    COPD (chronic obstructive pulmonary disease) (HCC)    Dyspnea    DOE   GERD (gastroesophageal reflux disease)    H/O coccidioidomycosis    was reason for lung lobectomy   Headache(784.0)    due to eye strain or not eating   Hemoptysis 05/08/2023   History of anemia    no current problem   History of asthma    as a child   History of breast cancer 2014   left   History of chemotherapy    finished 07/17/2013   History of hiatal hernia    AGE 57   Hx of radiation therapy 03/25/13-05/06/13   left breast 5000 cGy/25 sessions, left breast boost 1000 cGy/5 sessions   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   Personal history of chemotherapy    for liver cancer    Personal history of radiation therapy    Pneumonia    LAST FLARE UP 01/2018   Rash 02/22/2021   Runny nose 07/30/2013   clear drainage   Smoker 05/24/2021   Wears dentures    upper    SURGICAL HISTORY: Past Surgical History:  Procedure Laterality Date   APPLICATION OF CRANIAL NAVIGATION N/A 08/04/2017   Procedure: APPLICATION OF CRANIAL NAVIGATION;  Surgeon: [name], Loura Halt, MD;  Location: [facility];  Service: Neurosurgery;  Laterality: N/A;   AXILLARY LYMPH NODE DISSECTION Left 02/08/2013   Procedure: LEFT AXILLARY DISSECTION;  Surgeon: Mariella Saa, MD;  Location: [facility];  Service: General;  Laterality: Left;   BREAST CYST EXCISION Right 2006   BREAST LUMPECTOMY Left 2014   BREAST LUMPECTOMY WITH NEEDLE LOCALIZATION AND AXILLARY SENTINEL LYMPH NODE BX Left 12/31/2012   Procedure: NEEDLE LOCALIZATION LEFT BREAST LUMPECTOMY AND LEFT AXILLARY SENTENIAL LYMPH NODE BX;  Surgeon: Mariella Saa, MD;  Location: [facility];  Service: General;  Laterality: Left;   BRONCHIAL BRUSHINGS  03/17/2020   Procedure: BRONCHIAL BRUSHINGS;  Surgeon: Josephine Igo, DO;  Location: [facility];  Service: Pulmonary;;   BRONCHIAL WASHINGS  03/17/2020   Procedure: BRONCHIAL WASHINGS;  Surgeon: Josephine Igo, DO;  Location: [facility];  Service: Pulmonary;;   CESAREAN SECTION  1995/1996   CRANIOTOMY Right 08/04/2017   Procedure: Right Frontal craniotomy for resection of tumor with stereotactic navigation;  Surgeon: [name], Loura Halt, MD;  Location: [facility] OR;  Service: Neurosurgery;  Laterality: Right;  Right Frontal craniotomy for resection of tumor with stereotactic navigation   IR CV LINE INJECTION  02/19/2021   IR CV LINE INJECTION  11/12/2021   IR IMAGING GUIDED PORT INSERTION  05/31/2019   LUNG LOBECTOMY Left 05/1996   upper lobe - due to [facility] Fever   PORT-A-CATH REMOVAL Right 08/02/2013   Procedure: REMOVAL PORT-A-CATH;  Surgeon: Mariella Saa, MD;   Location: [facility];  Service: General;  Laterality: Right;   PORTACATH PLACEMENT  07/02/2012   Procedure: INSERTION PORT-A-CATH;  Surgeon: Mariella Saa, MD;  Location: [facility];  Service: General;  Laterality: N/A;  right   VIDEO BRONCHOSCOPY N/A 03/17/2020   Procedure: VIDEO BRONCHOSCOPY;  Surgeon: Josephine Igo, DO;  [facility]: [facility];  Service: Pulmonary;  Laterality: N/A;   VIDEO BRONCHOSCOPY WITH ENDOBRONCHIAL ULTRASOUND N/A 05/23/2018   Procedure: VIDEO BRONCHOSCOPY WITH ENDOBRONCHIAL ULTRASOUND;  Surgeon: Josephine Igo, DO;  Location: [facility];  Service: Thoracic;  Laterality: N/A;    SOCIAL HISTORY: Social History  Socioeconomic History   Marital status: Married    Spouse name: Not on file   Number of children: 2   Years of education: Not on file   Highest education level: Not on file  Occupational History   Occupation: Investment banker, corporate  Tobacco Use   Smoking status: Every Day    Current packs/day: 0.30    Average packs/day: 0.3 packs/day for 38.0 years (11.4 ttl pk-yrs)    Types: Cigarettes   Smokeless tobacco: Never   Tobacco comments:    12 cigarettes a day-05/08/23  Vaping Use   Vaping status: Never Used  Substance and Sexual Activity   Alcohol use: No   Drug use: No   Sexual activity: Not Currently  Other Topics Concern   Not on file  Social History Narrative   Not on file   Social Drivers of Health   Financial Resource Strain: Low Risk  (01/03/2023)   [facility] from [facility], [facility]   Overall Financial Resource Strain (CARDIA)    Difficulty of Paying Living Expenses: Not hard at all  Food Insecurity: No Food Insecurity (01/30/2023)   Hunger Vital Sign    Worried About Running Out of Food in Melissa Last Year: Never true    Ran Out of Food in Melissa Last Year: Never true  Transportation Needs: No Transportation Needs (01/30/2023)   PRAPARE - Scientist, research (physical sciences) (Medical): No    Lack of Transportation (Non-Medical): No  Physical Activity: Not on file  Stress: Not on file  Social Connections: Not on file  Intimate Partner Violence: Not At Risk (01/30/2023)   Humiliation, Afraid, Rape, and Kick questionnaire    Fear of Current or Ex-Partner: No    Emotionally Abused: No    Physically Abused: No    Sexually Abused: No    FAMILY HISTORY: Family History  Problem Relation Age of Onset   Emphysema Mother        was a smoker   Heart disease Mother    Melanoma Mother        dx in her 34s   Breast cancer Mother 63   Breast cancer Cousin 39 - 32       mother's maternal cousin   Asthma Brother     Review of Systems  Constitutional:  Negative for appetite change, chills, fatigue, fever and unexpected weight change.  HENT:   Negative for hearing loss, lump/mass and trouble swallowing.   Eyes:  Negative for eye problems and icterus.  Respiratory:  Negative for chest tightness, cough and shortness of breath.   Cardiovascular:  Negative for chest pain, leg swelling and palpitations.  Gastrointestinal:  Negative for abdominal distention, abdominal pain, constipation, diarrhea, nausea and vomiting.  Endocrine: Negative for hot flashes.  Genitourinary:  Negative for difficulty urinating.   Musculoskeletal:  Negative for arthralgias.  Skin:  Negative for itching and rash.  Neurological:  Negative for dizziness, extremity weakness, headaches and numbness.  Hematological:  Negative for adenopathy. Does not bruise/bleed easily.  Psychiatric/Behavioral:  Negative for depression. Melissa [name] is not nervous/anxious.       PHYSICAL EXAMINATION   Onc Performance Status - 06/27/23 1200       KPS SCALE   KPS % SCORE Normal activity with effort, some s/s of disease             There were no vitals filed for this visit.  Physical Exam Constitutional:      General:  She is not in acute distress.    Appearance: Normal appearance. She  is not toxic-appearing.  HENT:     Head: Normocephalic and atraumatic.     Mouth/Throat:     Mouth: Mucous membranes are moist.     Pharynx: Oropharynx is clear. No oropharyngeal exudate or posterior oropharyngeal erythema.  Eyes:     General: No scleral icterus. Cardiovascular:     Rate and Rhythm: Normal rate and regular rhythm.     Pulses: Normal pulses.     Heart sounds: Normal heart sounds.  Pulmonary:     Effort: Pulmonary effort is normal.     Breath sounds: Normal breath sounds.  Abdominal:     General: Abdomen is flat. Bowel sounds are normal. There is no distension.     Palpations: Abdomen is soft.     Tenderness: There is no abdominal tenderness.  Musculoskeletal:        General: No swelling.     Cervical back: Neck supple.  Lymphadenopathy:     Cervical: No cervical adenopathy.  Skin:    General: Skin is warm and dry.     Findings: No rash.  Neurological:     General: No focal deficit present.     Mental Status: She is alert.  Psychiatric:        Mood and Affect: Mood normal.        Behavior: Behavior normal.     LABORATORY DATA:  CBC    Component Value Date/Time   WBC 9.9 06/27/2023 1145   WBC 11.4 (H) 03/28/2023 1051   RBC 3.65 (L) 06/27/2023 1145   HGB 9.9 (L) 06/27/2023 1145   HGB 13.1 08/12/2014 0955   HCT 31.4 (L) 06/27/2023 1145   HCT 40.4 08/12/2014 0955   PLT 478 (H) 06/27/2023 1145   PLT 228 08/12/2014 0955   MCV 86.0 06/27/2023 1145   MCV 98.2 08/12/2014 0955   MCH 27.1 06/27/2023 1145   MCHC 31.5 06/27/2023 1145   RDW 19.1 (H) 06/27/2023 1145   RDW 13.0 08/12/2014 0955   LYMPHSABS 2.2 06/27/2023 1145   LYMPHSABS 2.0 08/12/2014 0955   MONOABS 0.9 06/27/2023 1145   MONOABS 0.5 08/12/2014 0955   EOSABS 1.0 (H) 06/27/2023 1145   EOSABS 0.2 08/12/2014 0955   BASOSABS 0.1 06/27/2023 1145   BASOSABS 0.0 08/12/2014 0955    CMP     Component Value Date/Time   NA 136 03/28/2023 1051   NA 142 08/12/2014 0956   K 4.7 03/28/2023 1051    K 4.6 08/12/2014 0956   CL 101 03/28/2023 1051   CL 100 12/20/2012 1509   CO2 24 03/28/2023 1051   CO2 26 08/12/2014 0956   GLUCOSE 84 03/28/2023 1051   GLUCOSE 94 08/12/2014 0956   GLUCOSE 98 12/20/2012 1509   BUN 9 03/28/2023 1051   BUN 10.1 08/12/2014 0956   CREATININE 0.83 03/28/2023 1051   CREATININE 0.9 08/12/2014 0956   CALCIUM 9.3 03/28/2023 1051   CALCIUM 9.3 08/12/2014 0956   PROT 6.3 03/28/2023 1051   PROT 7.1 08/12/2014 0956   ALBUMIN 3.6 11/29/2022 0939   ALBUMIN 3.8 08/12/2014 0956   AST 13 03/28/2023 1051   AST 13 (L) 11/29/2022 0939   AST 15 08/12/2014 0956   ALT 13 03/28/2023 1051   ALT 14 11/29/2022 0939   ALT 11 08/12/2014 0956   ALKPHOS 62 11/29/2022 0939   ALKPHOS 68 08/12/2014 0956   BILITOT 0.2 03/28/2023 1051   BILITOT 0.2 (L)  11/29/2022 0939   BILITOT 0.25 08/12/2014 0956   GFRNONAA >60 11/29/2022 0939   GFRNONAA 76 03/11/2019 1057   GFRAA >60 03/24/2020 1012   GFRAA >60 02/11/2020 0836   GFRAA 88 03/11/2019 1057     ASSESSMENT and THERAPY PLAN:   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    Metastatic Breast Cancer Stable disease on Herceptin. Hemoptysis that led to discontinuation of Avastin has since resolved.  -Continue Herceptin. -CT chest, abdomen, pelvis shows no progression of metastatic disease -MRI brain shows no progression of disease or sequelae from prior radiation -Echocardiogram on 06/20/2023 shows well preserved EF  Aspergillosis Followed by Dr. Daiva Eves in infectious disease CT scans show improvement in lungs.   Recommended continue f/u with Dr. Zenaida Niece dam and Dr. Judeth Horn.  Scans routed to both physicians.   Mood changes Requesting fill history from [name] pharmacy to obtain name of new medication  prescribed Continue f/u with PCP for management.    We will see Melissa [name] back every 3 weeks for treatment.    All questions were answered. Melissa [name] knows to call Melissa clinic with any problems, questions or concerns. We can certainly see Melissa [name] much sooner if necessary.  Total encounter time:20 minutes*in face-to-face visit time, chart review, lab review, care coordination, order entry, and documentation of Melissa encounter time.    Lillard Anes, NP 06/27/23 2:01 PM Medical Oncology and Hematology [facility] [address] [place], [place] [address] Tel. [phone]    Fax. [phone]  *Total Encounter Time as defined by [place] Centers for Medicare and Medicaid Services includes, in addition to Melissa face-to-face time of a [name] visit (documented in Melissa note above) non-face-to-face time: obtaining and reviewing outside history, ordering and reviewing medications, tests or procedures, care coordination (communications with other health care professionals or caregivers) and documentation in Melissa medical record.

## 2023-06-27 NOTE — Assessment & Plan Note (Signed)
Melissa [name] is a 57 year-old woman with HER2+ metastatic breast cancer here today for f/u prior to receiving herceptin.    Metastatic Breast Cancer Stable disease on Herceptin. Hemoptysis that led to discontinuation of Avastin has since resolved.  -Continue Herceptin. -CT chest, abdomen, pelvis shows no progression of metastatic disease -MRI brain shows no progression of disease or sequelae from prior radiation -Echocardiogram on 06/20/2023 shows well preserved EF  Aspergillosis Followed by Dr. Daiva Eves in infectious disease CT scans show improvement in lungs.   Recommended continue f/u with Dr. Zenaida Niece dam and Dr. Judeth Horn.  Scans routed to both physicians.   Mood changes Requesting fill history from patient pharmacy to obtain name of new medication prescribed Continue f/u with PCP for management.    We will see Cheylynn back every 3 weeks for treatment.

## 2023-07-06 ENCOUNTER — Other Ambulatory Visit: Payer: Self-pay

## 2023-07-16 ENCOUNTER — Other Ambulatory Visit: Payer: Self-pay

## 2023-07-17 ENCOUNTER — Ambulatory Visit: Payer: Commercial Managed Care - HMO | Admitting: Pulmonary Disease

## 2023-07-18 ENCOUNTER — Inpatient Hospital Stay: Payer: Medicare Other | Attending: Hematology and Oncology

## 2023-07-18 ENCOUNTER — Inpatient Hospital Stay: Payer: Medicare HMO

## 2023-07-18 ENCOUNTER — Encounter: Payer: Self-pay | Admitting: Hematology and Oncology

## 2023-07-18 VITALS — BP 110/63 | HR 84 | Temp 98.4°F | Resp 18 | Wt 111.4 lb

## 2023-07-18 DIAGNOSIS — Z1731 Human epidermal growth factor receptor 2 positive status: Secondary | ICD-10-CM | POA: Insufficient documentation

## 2023-07-18 DIAGNOSIS — C50212 Malignant neoplasm of upper-inner quadrant of left female breast: Secondary | ICD-10-CM | POA: Diagnosis not present

## 2023-07-18 DIAGNOSIS — C50912 Malignant neoplasm of unspecified site of left female breast: Secondary | ICD-10-CM

## 2023-07-18 DIAGNOSIS — Z171 Estrogen receptor negative status [ER-]: Secondary | ICD-10-CM

## 2023-07-18 DIAGNOSIS — Z5112 Encounter for antineoplastic immunotherapy: Secondary | ICD-10-CM | POA: Diagnosis not present

## 2023-07-18 DIAGNOSIS — Z1721 Progesterone receptor positive status: Secondary | ICD-10-CM | POA: Insufficient documentation

## 2023-07-18 DIAGNOSIS — C787 Secondary malignant neoplasm of liver and intrahepatic bile duct: Secondary | ICD-10-CM | POA: Insufficient documentation

## 2023-07-18 DIAGNOSIS — Z95828 Presence of other vascular implants and grafts: Secondary | ICD-10-CM

## 2023-07-18 DIAGNOSIS — C7951 Secondary malignant neoplasm of bone: Secondary | ICD-10-CM | POA: Insufficient documentation

## 2023-07-18 DIAGNOSIS — C7931 Secondary malignant neoplasm of brain: Secondary | ICD-10-CM | POA: Insufficient documentation

## 2023-07-18 DIAGNOSIS — Z170 Estrogen receptor positive status [ER+]: Secondary | ICD-10-CM | POA: Diagnosis not present

## 2023-07-18 LAB — CBC WITH DIFFERENTIAL (CANCER CENTER ONLY)
Abs Immature Granulocytes: 0.04 10*3/uL (ref 0.00–0.07)
Basophils Absolute: 0.1 10*3/uL (ref 0.0–0.1)
Basophils Relative: 1 %
Eosinophils Absolute: 1.2 10*3/uL — ABNORMAL HIGH (ref 0.0–0.5)
Eosinophils Relative: 9 %
HCT: 30 % — ABNORMAL LOW (ref 36.0–46.0)
Hemoglobin: 9.6 g/dL — ABNORMAL LOW (ref 12.0–15.0)
Immature Granulocytes: 0 %
Lymphocytes Relative: 19 %
Lymphs Abs: 2.5 10*3/uL (ref 0.7–4.0)
MCH: 26.8 pg (ref 26.0–34.0)
MCHC: 32 g/dL (ref 30.0–36.0)
MCV: 83.8 fL (ref 80.0–100.0)
Monocytes Absolute: 0.8 10*3/uL (ref 0.1–1.0)
Monocytes Relative: 6 %
Neutro Abs: 8.8 10*3/uL — ABNORMAL HIGH (ref 1.7–7.7)
Neutrophils Relative %: 65 %
Platelet Count: 465 10*3/uL — ABNORMAL HIGH (ref 150–400)
RBC: 3.58 MIL/uL — ABNORMAL LOW (ref 3.87–5.11)
RDW: 18.1 % — ABNORMAL HIGH (ref 11.5–15.5)
WBC Count: 13.4 10*3/uL — ABNORMAL HIGH (ref 4.0–10.5)
nRBC: 0 % (ref 0.0–0.2)

## 2023-07-18 LAB — TOTAL PROTEIN, URINE DIPSTICK: Protein, ur: NEGATIVE mg/dL

## 2023-07-18 MED ORDER — SODIUM CHLORIDE 0.9% FLUSH
10.0000 mL | INTRAVENOUS | Status: DC | PRN
Start: 2023-07-18 — End: 2023-07-18
  Administered 2023-07-18: 10 mL

## 2023-07-18 MED ORDER — TRASTUZUMAB-ANNS CHEMO 150 MG IV SOLR
6.0000 mg/kg | Freq: Once | INTRAVENOUS | Status: AC
  Administered 2023-07-18: 300 mg via INTRAVENOUS
  Filled 2023-07-18: qty 14.29

## 2023-07-18 MED ORDER — DIPHENHYDRAMINE HCL 25 MG PO CAPS
25.0000 mg | ORAL_CAPSULE | Freq: Once | ORAL | Status: AC
  Administered 2023-07-18: 25 mg via ORAL
  Filled 2023-07-18: qty 1

## 2023-07-18 MED ORDER — SODIUM CHLORIDE 0.9 % IV SOLN: Freq: Once | INTRAVENOUS | Status: AC

## 2023-07-18 MED ORDER — ACETAMINOPHEN 325 MG PO TABS
650.0000 mg | ORAL_TABLET | Freq: Once | ORAL | Status: AC
  Administered 2023-07-18: 650 mg via ORAL
  Filled 2023-07-18: qty 2

## 2023-07-18 MED ORDER — SODIUM CHLORIDE 0.9% FLUSH
10.0000 mL | INTRAVENOUS | Status: DC | PRN
  Administered 2023-07-18: 10 mL

## 2023-07-18 MED ORDER — HEPARIN SOD (PORK) LOCK FLUSH 100 UNIT/ML IV SOLN
500.0000 [IU] | Freq: Once | INTRAVENOUS | Status: AC | PRN
  Administered 2023-07-18: 500 [IU]

## 2023-07-18 NOTE — Patient Instructions (Signed)
 [facility] - A DEPT OF [facility]  Discharge Instructions: Thank you for choosing [facility] to provide your oncology and hematology care.   If you have a lab appointment with the Cancer Center, please go directly to the Cancer Center and check in at the registration area.   Wear comfortable clothing and clothing appropriate for easy access to any Portacath or PICC line.   We strive to give you quality time with your provider. You may need to reschedule your appointment if you arrive late (15 or more minutes).  Arriving late affects you and other patients whose appointments are after yours.  Also, if you miss three or more appointments without notifying the office, you may be dismissed from the clinic at the provider's discretion.      For prescription refill requests, have your pharmacy contact our office and allow 72 hours for refills to be completed.    Today you received the following chemotherapy and/or immunotherapy agents: Kanjitni.       To help prevent nausea and vomiting after your treatment, we encourage you to take your nausea medication as directed.  BELOW ARE SYMPTOMS THAT SHOULD BE REPORTED IMMEDIATELY: *FEVER GREATER THAN 100.4 F (38 C) OR HIGHER *CHILLS OR SWEATING *NAUSEA AND VOMITING THAT IS NOT CONTROLLED WITH YOUR NAUSEA MEDICATION *UNUSUAL SHORTNESS OF BREATH *UNUSUAL BRUISING OR BLEEDING *URINARY PROBLEMS (pain or burning when urinating, or frequent urination) *BOWEL PROBLEMS (unusual diarrhea, constipation, pain near the anus) TENDERNESS IN MOUTH AND THROAT WITH OR WITHOUT PRESENCE OF ULCERS (sore throat, sores in mouth, or a toothache) UNUSUAL RASH, SWELLING OR PAIN  UNUSUAL VAGINAL DISCHARGE OR ITCHING   Items with * indicate a potential emergency and should be followed up as soon as possible or go to the Emergency Department if any problems should occur.  Please show the CHEMOTHERAPY ALERT CARD or IMMUNOTHERAPY  ALERT CARD at check-in to the Emergency Department and triage nurse.  Should you have questions after your visit or need to cancel or reschedule your appointment, please contact [facility] - A DEPT OF [facility]  Dept: [phone]  and follow the prompts.  Office hours are 8:00 a.m. to 4:30 p.m. Monday - Friday. Please note that voicemails left after 4:00 p.m. may not be returned until the following business day.  We are closed weekends and major holidays. You have access to a nurse at all times for urgent questions. Please call the main number to the clinic Dept: [phone] and follow the prompts.   For any non-urgent questions, you may also contact your provider using MyChart. We now offer e-Visits for anyone 64 and older to request care online for non-urgent symptoms. For details visit [email or web address].   Also download the MyChart app! Go to the app store, search "MyChart", open the app, select [place], and log in with your MyChart username and password.

## 2023-07-19 ENCOUNTER — Encounter: Payer: Self-pay | Admitting: Hematology and Oncology

## 2023-07-21 ENCOUNTER — Encounter: Payer: Self-pay | Admitting: Pulmonary Disease

## 2023-07-21 ENCOUNTER — Telehealth (INDEPENDENT_AMBULATORY_CARE_PROVIDER_SITE_OTHER): Payer: Medicare HMO | Admitting: Pulmonary Disease

## 2023-07-21 ENCOUNTER — Other Ambulatory Visit: Payer: Self-pay | Admitting: Hematology and Oncology

## 2023-07-21 DIAGNOSIS — B441 Other pulmonary aspergillosis: Secondary | ICD-10-CM

## 2023-07-21 DIAGNOSIS — J479 Bronchiectasis, uncomplicated: Secondary | ICD-10-CM

## 2023-07-21 NOTE — Progress Notes (Signed)
 @Patient  ID: Melissa [name] Flight, female    DOB: [birth year], 58 y.o.   MRN: [id number]  No chief complaint on file.   Referring provider: [name], MD  HPI:   58 y.o. 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  Most recent ID note reviewed.  Most recent heme-onc note reviewed.  Overall improved.  Message ID and hematology/oncology provider after last visit given concern for a Avastin  leading to cough and architectural distortion and vasculature leading to hemoptysis, combination of symptoms related to Avastin  feared.  Avastin  was stopped.  Hemoptysis resolved.  Cough is getting better.  She was switched from voriconazole  to posaconazole.  It seems cough seem to worsen with this change.  She is been on posaconazole for a couple of weeks she states.  Cough is improved in the interim.  Daily cough better.  Does cough a bit at night.  This is well-controlled with codeine  cough syrup prescribed by her hematologist/oncologist.  Reviewed recent CT scan 06/2023 mammography interpretation shows improvement in the thickness of the wall and air-fluid level in cavities on the left on my interpretation.  HPI at initial visit: Patient last in clinic in 2022.  Lost to follow-up.  Has had serial CT scans given her history of breast cancer.  For several years has demonstrated significant bronchiectatic changes, cavitary lesion that looks like a very large dilated bronchus on my review interpretation, relatively severe bronchiectasis on the left, and scattered emphysematous changes with waxing and waning air-fluid levels in the cavities.  Recently it was discovered she had intracranial metastasis.  She is undergone radiation  therapy.  She started on a Avastin  about 9 weeks ago, has received 3 infusions.  She states cough worsened shortly after initiation of a Avastin .  Over the last few weeks she has had hemoptysis.  In the past she has had hemoptysis come and go.  But is pretty persistent over the last several weeks.  This prompted visit to the ED in [place] via [facility] system 04/09/2023 CT scan at that date is interpreted as infiltrate near prior cavitary lesion concerning for progression of aspergillosis.  I cannot review these images.  She endorses weight loss.  Some chills.  Recent voriconazole  level seems low but I am not quite sure how best to interpret it.    Questionaires / Pulmonary Flowsheets:   ACT:      No data to display          MMRC: mMRC Dyspnea Scale mMRC Score  03/05/2020  3:06 PM 1    Epworth:      No data to display          Tests:   FENO:  No results found for: NITRICOXIDE  PFT:    Latest Ref Rng & Units 08/09/2016    9:43 AM  PFT Results  FVC-Pre L 2.79   FVC-Predicted Pre % 83   FVC-Post L 2.92   FVC-Predicted Post % 87   Pre FEV1/FVC % % 69   Post FEV1/FCV % % 70   FEV1-Pre L 1.94   FEV1-Predicted Pre % 73   FEV1-Post L 2.05   DLCO uncorrected ml/min/mmHg 10.08   DLCO UNC% % 46   DLVA Predicted % 55  TLC L 4.75   TLC % Predicted % 100   RV % Predicted % 124   Personally reviewed interpreted as mild COPD, no significant bronchodilator response, lung volumes consistent with air trapping, DLCO severely reduced  WALK:     05/02/2018    3:51 PM  SIX MIN WALK  Supplimental Oxygen during Test? (L/min) No  Tech Comments: Fast paced with some SOB noted.    Imaging: Personally reviewed and as per EMR discussion this note No results found.  Lab Results: Personally reviewed CBC    Component Value Date/Time   WBC 13.4 (H) 07/18/2023 1222   WBC 11.4 (H) 03/28/2023 1051   RBC 3.58 (L) 07/18/2023 1222   HGB 9.6 (L) 07/18/2023 1222   HGB 13.1 08/12/2014 0955    HCT 30.0 (L) 07/18/2023 1222   HCT 40.4 08/12/2014 0955   PLT 465 (H) 07/18/2023 1222   PLT 228 08/12/2014 0955   MCV 83.8 07/18/2023 1222   MCV 98.2 08/12/2014 0955   MCH 26.8 07/18/2023 1222   MCHC 32.0 07/18/2023 1222   RDW 18.1 (H) 07/18/2023 1222   RDW 13.0 08/12/2014 0955   LYMPHSABS 2.5 07/18/2023 1222   LYMPHSABS 2.0 08/12/2014 0955   MONOABS 0.8 07/18/2023 1222   MONOABS 0.5 08/12/2014 0955   EOSABS 1.2 (H) 07/18/2023 1222   EOSABS 0.2 08/12/2014 0955   BASOSABS 0.1 07/18/2023 1222   BASOSABS 0.0 08/12/2014 0955    BMET    Component Value Date/Time   NA 136 03/28/2023 1051   NA 142 08/12/2014 0956   K 4.7 03/28/2023 1051   K 4.6 08/12/2014 0956   CL 101 03/28/2023 1051   CL 100 12/20/2012 1509   CO2 24 03/28/2023 1051   CO2 26 08/12/2014 0956   GLUCOSE 84 03/28/2023 1051   GLUCOSE 94 08/12/2014 0956   GLUCOSE 98 12/20/2012 1509   BUN 9 03/28/2023 1051   BUN 10.1 08/12/2014 0956   CREATININE 0.83 03/28/2023 1051   CREATININE 0.9 08/12/2014 0956   CALCIUM 9.3 03/28/2023 1051   CALCIUM 9.3 08/12/2014 0956   GFRNONAA >60 11/29/2022 0939   GFRNONAA 76 03/11/2019 1057   GFRAA >60 03/24/2020 1012   GFRAA >60 02/11/2020 0836   GFRAA 88 03/11/2019 1057    BNP No results found for: BNP  ProBNP No results found for: PROBNP  Specialty Problems       Pulmonary Problems   Obstructive bronchiectasis (HCC)   Status post left upper lobectomy for ? [place]  1997 CT chest   01/26/2011 Focal bronchiectasis / scarring medially in the left lung apex with  surrounding inflammatory changes  - Pneumovax 08/12/11  And prevnar 08/09/2016  - PFT's  08/09/2016  FEV1 2.05 (77 % ) ratio 70  p 5 % improvement from saba p dulera  100 x2  prior to study with DLCO  46 % corrects to 55  % for alv volume   - alpha one screening 03/27/2018  MM 267  - FOB/BAL 05/23/18 Pos Aspergillus > ID following -  12/13/2018  After extensive coaching inhaler device,  effectiveness =    75%  (short ti)        Upper airway cough syndrome   Max rx for gerd trial basis 08/09/2016 >>>         HCAP (healthcare-associated pneumonia)   New density November 05 2016 where prev there was a cystic lesion > rx levaquin  with marked improvement cxr 11/25/2016  > f/u cxr 06/16/2017 no def pna  CAP (community acquired pneumonia)   See cxr 11/22/2017 LLL > rx augmentin  x 10 days       Emphysema lung (HCC)   Cavitary lesion of lung in area of previous cyts in apex of Sup Segment of LLL   New since CT 01/18/18  assoc with acute onset L axillary cp 03/28/18 in area of previous blebs s a/f level c/w infected bleb rx augmentin  x 10 days > improved at ov 04/11/2018 and 10 more days augmentin  given - Quant TB 03/27/2018 neg  - ID eval 04/09/18 fungal serologies sent > neg  - 04/11/2018 rec another 10 days augmentin  for ? Abscess forming LUL  - Following with ID ? Start fluconazole  if not improving. Req records be sent to MD who treated [place] in '97   - CT 04/26/18 > Overall, there has been interval worsening in lung disease on the Left  - FOB Dr Karlis 05/24/19 with BAL pos for aspergillus > see ID rx          DOE (dyspnea on exertion)   05/02/2018  Walked RA x 3 laps @ 185 ft each stopped due to  End of study, fast pace, no   desat - min sob         Pneumonia   Chronic pulmonary aspergillosis (HCC)   Pleural effusion   Hemoptysis    Allergies  Allergen Reactions   Aspirin Anaphylaxis and Shortness Of Breath    THROAT CLOSES   Protonix  [Pantoprazole ] Nausea Only and Other (See Comments)    Also caused a film in the mouth and caused chest pressure   Doxycycline  Other (See Comments)    Hallucinations, headaches   Promethazine -Codeine  Cough    Worsening cough and insomnia    Sulfamethoxazole-Trimethoprim  Other (See Comments)   Iodinated Contrast Media Rash    Patient allergic to contrast used in radiation oncology for CT simulation     Immunization History  Administered  Date(s) Administered   Influenza Split 05/03/2011   Influenza Whole 03/11/2012   Influenza, Seasonal, Injecte, Preservative Fre 05/08/2023   Influenza,inj,Quad PF,6+ Mos 07/29/2017, 04/08/2019, 04/20/2020, 05/24/2021   Influenza-Unspecified 04/10/2022   PNEUMOCOCCAL CONJUGATE-20 08/18/2021   Pneumococcal Conjugate-13 08/09/2016   Pneumococcal-Unspecified 08/12/2011    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)    2019   Cancer, metastatic to liver (HCC)    2021   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)    Complication of anesthesia    bp dropped + desat    COPD (chronic obstructive pulmonary disease) (HCC)    Dyspnea    DOE   GERD (gastroesophageal reflux disease)    H/O coccidioidomycosis    was reason for lung lobectomy   Headache(784.0)    due to eye strain or not eating   Hemoptysis 05/08/2023   History of anemia    no current problem   History of asthma    as a child   History of breast cancer 2014   left   History of chemotherapy    finished 07/17/2013   History of hiatal hernia    AGE 38   Hx of radiation therapy 03/25/13-05/06/13   left breast  5000 cGy/25 sessions, left breast boost 1000 cGy/5 sessions   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   Personal history of chemotherapy    for liver cancer   Personal history of radiation therapy    Pneumonia    LAST FLARE UP 01/2018   Rash 02/22/2021   Runny nose 07/30/2013   clear drainage   Smoker 05/24/2021   Wears dentures    upper    Tobacco History: Social History   Tobacco Use  Smoking Status Every Day   Current packs/day: 0.30   Average packs/day: 0.3 packs/day for 38.0 years (11.4 ttl pk-yrs)   Types: Cigarettes  Smokeless Tobacco Never  Tobacco Comments   12 cigarettes a day-05/08/23    Ready to quit: Not Answered Counseling given: Not Answered Tobacco comments: 12 cigarettes a day-05/08/23   Continue to not smoke  Outpatient Encounter Medications as of 07/21/2023  Medication Sig   acetaminophen  (TYLENOL ) 325 MG tablet Take 2 tablets (650 mg total) by mouth every 6 (six) hours as needed for mild pain (or Fever >/= 101).   albuterol  (VENTOLIN  HFA) 108 (90 Base) MCG/ACT inhaler Inhale 2 puffs into the lungs every 6 (six) hours as needed for wheezing or shortness of breath (cough).   calcium carbonate (TUMS EX) 750 MG chewable tablet Chew by mouth.   chlorpheniramine -HYDROcodone  (TUSSIONEX) 10-8 MG/5ML Take 5 mLs by mouth 2 (two) times daily.   cycloSPORINE (RESTASIS) 0.05 % ophthalmic emulsion Place 1 drop into the right eye.   GARLIC PO Take by mouth.   levalbuterol  (XOPENEX ) 0.63 MG/3ML nebulizer solution Take 3 mLs (0.63 mg total) by nebulization every 4 (four) hours as needed for wheezing or shortness of breath.   lidocaine -prilocaine  (EMLA ) cream APPLY EXTERNALLY TO THE AFFECTED AREA 1 TIME   Multiple Vitamins-Minerals (EMERGEN-C VITAMIN C PO) Take by mouth.   naproxen sodium (ALEVE) 220 MG tablet Take by mouth.   OIL OF OREGANO PO Take 500 mg by mouth 2 (two) times daily.   ondansetron  (ZOFRAN -ODT) 4 MG disintegrating tablet Take 4 mg by mouth every 8 (eight) hours as needed.   predniSONE  (DELTASONE ) 50 MG tablet Take 1 tablet 13 hrs prior to scan, 2nd tablet 7 hrs prior, 3rd tablet 1 hr prior   SYMBICORT  160-4.5 MCG/ACT inhaler Inhale 2 puffs into the lungs 2 (two) times daily.   triamcinolone  ointment (KENALOG ) 0.5 % Apply 1 Application topically 2 (two) times daily.   Vitamin E (VITAMIN E/D-ALPHA NATURAL) 268 MG (400 UNIT) CAPS Take by mouth.   [DISCONTINUED] Fluticasone -Salmeterol,sensor, (AIRDUO DIGIHALER ) 113-14 MCG/ACT AEPB Inhale 1 puff into the lungs 2 (two) times daily.   Facility-Administered Encounter Medications as of 07/21/2023  Medication    alteplase  (CATHFLO ACTIVASE ) injection 2 mg   sodium chloride  flush (NS) 0.9 % injection 10 mL     Review of Systems  Review of Systems  N/a Physical Exam  LMP 07/25/2012 Comment: pregnancy waiver form signed 01-18-2018  Wt Readings from Last 5 Encounters:  07/18/23 111 lb 6.4 oz (50.5 kg)  06/27/23 110 lb 1 oz (49.9 kg)  06/19/23 109 lb 9.6 oz (49.7 kg)  05/16/23 115 lb 4.8 oz (52.3 kg)  05/08/23 118 lb 6.4 oz (53.7 kg)    BMI Readings from Last 5 Encounters:  07/18/23 20.38 kg/m  06/27/23 20.13 kg/m  06/19/23 20.05 kg/m  05/16/23 21.09 kg/m  05/08/23 21.66 kg/m     Physical Exam General: Sitting in chair, no acute distress Eyes: EOMI, no icterus Pulmonary: Normal work of breathing, on room  air, speaks in full sentences without distress Neuro: Moves all limbs freely, no focal deficit Psych: Normal mood, full affect   Assessment & Plan:   Hemoptysis: Worse over the last few weeks.  With onset of worsening cough shortly after initiation of Avastin .  Cough is a relatively common side effect in the package insert.  It is likely worsening cough that prompted evaluation related to Avastin  and hemoptysis related to architectural disruption due to bronchiectasis with further exacerbation due to the effects of a Avastin .  CT scan prior to initial evaluation indicates worsening infiltrate near prior cavitary lesion, suspect this is just aspirated blood in that area as opposed to worsening infection.  At last visit, I prescribed 14 days of levofloxacin  given her history of haemophilus influenza and Pseudomonas. Avastin  was discontinued and hemoptysis has resolved.   Aspergillosis: Historically was on chronic voriconazole , good therapeutic levels. Switched to posaconazole fall 2024.  Recently stopped due to worsening cough while on the medication.  Cough is improved in the interim.  Further management per infectious disease.  Consider bronchoscopy with BAL plus or minus biopsy in  the future if concern for aspergillosis is ongoing or concern for worsening disease.  Most recent CT reviewed.  Improved overall, again suspect prior images reflected aspirated blood versus true worsening of infection and thickening.   Bronchiectasis: Suspect related to chronic infections.  Prior lobectomy due to fungal infection.  Encouraged ongoing airway clearance hypertonic saline and flutter valve.  Chronic cough: Multifactorial with consideration discussed above.  Overall cough improved after stopping posaconazole.  Still with nocturnal cough well treated with as needed narcotic cough syrup at night.  Encouraged to continue this.   Return in about 6 months (around 01/18/2024) for f/u Dr. Annella.   Donnice JONELLE Annella, MD 07/21/2023  Virtual Visit via Video Note  I connected with Melissa [name] on 07/21/23 at  1:00 PM EST by a video enabled telemedicine application and verified that I am speaking with the correct person using two identifiers.  Location: Patient: Home - [place], [place] Provider: Office - [facility] - [address], [place], [place] [address]  I discussed the limitations of evaluation and management by telemedicine and the availability of in person appointments. The patient expressed understanding and agreed to proceed. I also discussed with the patient that there may be a patient responsible charge related to this service. The patient expressed understanding and agreed to proceed.  Patient consented to consult via telephone: Yes People present and their role in pt care: Pt

## 2023-07-21 NOTE — Patient Instructions (Signed)
 Nice to see you again  No changes to medication  I am glad the coughing up of blood has improved

## 2023-07-22 ENCOUNTER — Other Ambulatory Visit: Payer: Self-pay

## 2023-07-24 MED ORDER — HYDROCOD POLI-CHLORPHE POLI ER 10-8 MG/5ML PO SUER: 5.0000 mL | Freq: Two times a day (BID) | ORAL | 0 refills | Status: DC

## 2023-07-28 DIAGNOSIS — R454 Irritability and anger: Secondary | ICD-10-CM | POA: Diagnosis not present

## 2023-08-01 ENCOUNTER — Encounter: Payer: Self-pay | Admitting: Hematology and Oncology

## 2023-08-08 ENCOUNTER — Inpatient Hospital Stay (HOSPITAL_BASED_OUTPATIENT_CLINIC_OR_DEPARTMENT_OTHER): Payer: Medicare HMO | Admitting: Hematology and Oncology

## 2023-08-08 ENCOUNTER — Inpatient Hospital Stay: Payer: Medicare HMO

## 2023-08-08 VITALS — BP 100/61 | HR 73 | Temp 98.3°F | Resp 18 | Ht 62.0 in | Wt 109.0 lb

## 2023-08-08 DIAGNOSIS — C787 Secondary malignant neoplasm of liver and intrahepatic bile duct: Secondary | ICD-10-CM | POA: Diagnosis not present

## 2023-08-08 DIAGNOSIS — C50212 Malignant neoplasm of upper-inner quadrant of left female breast: Secondary | ICD-10-CM

## 2023-08-08 DIAGNOSIS — C7931 Secondary malignant neoplasm of brain: Secondary | ICD-10-CM

## 2023-08-08 DIAGNOSIS — Z1721 Progesterone receptor positive status: Secondary | ICD-10-CM | POA: Diagnosis not present

## 2023-08-08 DIAGNOSIS — Z5112 Encounter for antineoplastic immunotherapy: Secondary | ICD-10-CM | POA: Diagnosis not present

## 2023-08-08 DIAGNOSIS — Z170 Estrogen receptor positive status [ER+]: Secondary | ICD-10-CM | POA: Diagnosis not present

## 2023-08-08 DIAGNOSIS — Z1731 Human epidermal growth factor receptor 2 positive status: Secondary | ICD-10-CM | POA: Diagnosis not present

## 2023-08-08 DIAGNOSIS — Z171 Estrogen receptor negative status [ER-]: Secondary | ICD-10-CM

## 2023-08-08 DIAGNOSIS — C7951 Secondary malignant neoplasm of bone: Secondary | ICD-10-CM | POA: Diagnosis not present

## 2023-08-08 DIAGNOSIS — Z95828 Presence of other vascular implants and grafts: Secondary | ICD-10-CM

## 2023-08-08 LAB — CBC WITH DIFFERENTIAL (CANCER CENTER ONLY)
Abs Immature Granulocytes: 0.02 10*3/uL (ref 0.00–0.07)
Basophils Absolute: 0.1 10*3/uL (ref 0.0–0.1)
Basophils Relative: 1 %
Eosinophils Absolute: 1 10*3/uL — ABNORMAL HIGH (ref 0.0–0.5)
Eosinophils Relative: 11 %
HCT: 31.6 % — ABNORMAL LOW (ref 36.0–46.0)
Hemoglobin: 10 g/dL — ABNORMAL LOW (ref 12.0–15.0)
Immature Granulocytes: 0 %
Lymphocytes Relative: 22 %
Lymphs Abs: 2.2 10*3/uL (ref 0.7–4.0)
MCH: 26.7 pg (ref 26.0–34.0)
MCHC: 31.6 g/dL (ref 30.0–36.0)
MCV: 84.3 fL (ref 80.0–100.0)
Monocytes Absolute: 0.6 10*3/uL (ref 0.1–1.0)
Monocytes Relative: 6 %
Neutro Abs: 5.9 10*3/uL (ref 1.7–7.7)
Neutrophils Relative %: 60 %
Platelet Count: 410 10*3/uL — ABNORMAL HIGH (ref 150–400)
RBC: 3.75 MIL/uL — ABNORMAL LOW (ref 3.87–5.11)
RDW: 17.8 % — ABNORMAL HIGH (ref 11.5–15.5)
WBC Count: 9.9 10*3/uL (ref 4.0–10.5)
nRBC: 0 % (ref 0.0–0.2)

## 2023-08-08 MED ORDER — TRASTUZUMAB-ANNS CHEMO 150 MG IV SOLR
6.0000 mg/kg | Freq: Once | INTRAVENOUS | Status: AC
  Administered 2023-08-08: 300 mg via INTRAVENOUS
  Filled 2023-08-08: qty 14.29

## 2023-08-08 MED ORDER — ACETAMINOPHEN 325 MG PO TABS
650.0000 mg | ORAL_TABLET | Freq: Once | ORAL | Status: AC
Start: 2023-08-08 — End: 2023-08-08
  Administered 2023-08-08: 650 mg via ORAL
  Filled 2023-08-08: qty 2

## 2023-08-08 MED ORDER — DIPHENHYDRAMINE HCL 25 MG PO CAPS
25.0000 mg | ORAL_CAPSULE | Freq: Once | ORAL | Status: AC
  Administered 2023-08-08: 25 mg via ORAL
  Filled 2023-08-08: qty 1

## 2023-08-08 MED ORDER — SODIUM CHLORIDE 0.9% FLUSH
10.0000 mL | INTRAVENOUS | Status: DC | PRN
  Administered 2023-08-08: 10 mL via INTRAVENOUS

## 2023-08-08 MED ORDER — SODIUM CHLORIDE 0.9 % IV SOLN: Freq: Once | INTRAVENOUS | Status: AC

## 2023-08-08 MED ORDER — SODIUM CHLORIDE 0.9% FLUSH
10.0000 mL | INTRAVENOUS | Status: DC | PRN
  Administered 2023-08-08: 10 mL

## 2023-08-08 MED ORDER — HEPARIN SOD (PORK) LOCK FLUSH 100 UNIT/ML IV SOLN
500.0000 [IU] | Freq: Once | INTRAVENOUS | Status: AC | PRN
  Administered 2023-08-08: 500 [IU]

## 2023-08-08 NOTE — Patient Instructions (Signed)

## 2023-08-08 NOTE — Progress Notes (Signed)
Patient Care Team: Thana Ates, MD as PCP - General (Internal Medicine) Domenick Gong, MD as Referring Physician (Emergency Medicine)  DIAGNOSIS:  Encounter Diagnosis  Name Primary?   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 Ki-67 was 70% HER-2 was amplified with a ratio 2.91   07/12/2012 - 07/17/2013 Neo-Adjuvant Chemotherapy   TCH 6 followed by Herceptin maintenance   12/11/2012 Surgery   Left breast lumpectomy: 1.8 cm tumor 1 positive sentinel node, axillary lymph node dissection 02/08/2013 showed 0/13 lymph nodes   03/25/2013 - 05/06/2013 Radiation Therapy   Adjuvant radiation therapy   06/05/2013 - 07/20/2017 Anti-estrogen oral therapy   Tamoxifen 20 mg daily   07/27/2017 Relapse/Recurrence   MRI Brain: 3.4 x 2.9 x 2.9 cm RIGHT frontal lobe mass with imaging characteristics of solitary metastasis. Extensive vasogenic edema resulting in 9 mm RIGHT to LEFT midline shift. Equivocal very early LEFT ventricle entrapment.    08/04/2017 Surgery   Rt frontal brain resection: Poorly differentiated tumor IHC suggests breast primary ER and PR Positive   08/25/2017 - 09/04/2017 Radiation Therapy   Stereotactic radiation   09/18/2017 -  Anti-estrogen oral therapy   Lapatinib with letrozole   12/18/2017 - 12/19/2017 Radiation Therapy   New right parietal lobe metastases status post [facility]   08/27/2018 - 08/27/2020 Radiation Therapy   SRS to new brain metastases   05/09/2019 Relapse/Recurrence   Interval increase in size of the enhancing nodular left internal mammary soft tissue 2.8 cm.  Redemonstrated  enlarged supraclavicular, lower cervical and lower posterior cervical nodes unchanged.  Interval increase in the bony erosion of the posterior and lateral left third rib, increasing soft tissue lesion eroding the left sternal body 3.1 cm was 2.5 cm.  Bronchiectatic changes   06/19/2019 - 05/05/2020 Chemotherapy   ado-trastuzumab emtansine (KADCYLA)     06/15/2020 -  Chemotherapy   Xeloda, Tucatinib, Herceptin    04/12/2022 - 09/06/2022 Chemotherapy   Patient is on Treatment Plan : BREAST Trastuzumab IV (8/6) or SQ (600) D1 q21d     09/27/2022 -  Chemotherapy   Patient is on Treatment Plan : BREAST MAINTENANCE Trastuzumab IV (6) or SQ (600) D1 q21d x 13 cycles. Added bevacizumab (10) q21d starting cycle 8.     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 Added bevacizumab (10) q21d starting cycle 8.     Port-A-Cath in place    CHIEF COMPLIANT: Follow-up to review the results of recent scans and on Herceptin  HISTORY OF PRESENT ILLNESS: History of Present Illness   The patient presents  for follow-up regarding recent imaging results and ongoing treatment.  Recent imaging results show a spot on segment five of the liver, which has decreased in size from 15 by 11 mm to 14 by 9 mm. This spot was initially not believed to be cancerous and has not increased in size. The rest of the body scan was clear.  She is currently undergoing treatment with  Herceptin.  She does not need any medication refills at this time, except for hydrocodone, which she will request as needed. No pain or headaches.         ALLERGIES:  is allergic to aspirin, protonix [pantoprazole], doxycycline, promethazine-codeine, sulfamethoxazole-trimethoprim, and iodinated contrast media.  MEDICATIONS:  Current Outpatient Medications  Medication Sig Dispense Refill   acetaminophen (TYLENOL) 325 MG tablet Take 2 tablets (650 mg total) by mouth every 6 (six) hours as needed for mild pain (or Fever >/= 101). 12 tablet 0   albuterol (VENTOLIN HFA) 108 (90 Base) MCG/ACT inhaler Inhale 2 puffs into the lungs every 6 (six) hours as needed for wheezing or shortness of breath (cough). 18 g 5   calcium carbonate (TUMS EX) 750 MG chewable tablet Chew by mouth.     chlorpheniramine-HYDROcodone (TUSSIONEX) 10-8 MG/5ML Take 5 mLs by mouth 2 (two) times daily. 115 mL 0   cycloSPORINE (RESTASIS) 0.05 % ophthalmic emulsion Place 1 drop into the right eye.     GARLIC PO Take by mouth.     levalbuterol (XOPENEX) 0.63 MG/3ML nebulizer solution Take 3 mLs (0.63 mg total) by nebulization every 4 (four) hours as needed for wheezing or shortness of breath. 90 mL 12   lidocaine-prilocaine (EMLA) cream APPLY EXTERNALLY TO THE AFFECTED AREA 1 TIME 30 g 3   Multiple Vitamins-Minerals (EMERGEN-C VITAMIN C PO) Take by mouth.     naproxen sodium (ALEVE) 220 MG tablet Take by mouth.     OIL OF OREGANO PO Take 500 mg by mouth 2 (two) times daily.     ondansetron (ZOFRAN-ODT) 4 MG disintegrating tablet Take 4 mg by mouth every 8 (eight) hours as needed.     predniSONE (DELTASONE) 50 MG tablet Take 1 tablet 13 hrs prior to scan, 2nd tablet 7 hrs prior, 3rd tablet 1 hr prior 3 tablet 1   SYMBICORT 160-4.5 MCG/ACT inhaler Inhale 2 puffs into the lungs 2 (two) times daily.     triamcinolone ointment (KENALOG) 0.5 % Apply 1 Application topically 2 (two) times daily. 30 g 0   Vitamin E (VITAMIN E/D-ALPHA  NATURAL) 268 MG (400 UNIT) CAPS Take by mouth.     No current facility-administered medications for this visit.   Facility-Administered Medications Ordered in Other Visits  Medication Dose Route Frequency Provider Last Rate Last Admin   0.9 %  sodium chloride infusion   Intravenous Once Serena Croissant, MD       acetaminophen (TYLENOL) tablet 650 mg  650 mg Oral Once Serena Croissant, MD       alteplase (CATHFLO ACTIVASE) injection 2 mg  2 mg Intracatheter Once PRN Serena Croissant, MD       diphenhydrAMINE (BENADRYL) capsule 25 mg  25 mg Oral Once Serena Croissant, MD       heparin lock flush 100 unit/mL  500 Units Intracatheter Once PRN Serena Croissant, MD       sodium chloride flush (NS) 0.9 % injection 10 mL  10 mL Intracatheter PRN Serena Croissant, MD   10 mL at 07/06/21 1428   sodium chloride flush (NS) 0.9 %  injection 10 mL  10 mL Intracatheter PRN Serena Croissant, MD       trastuzumab-anns [facility]) 300 mg in sodium chloride 0.9 % 250 mL chemo infusion  6 mg/kg (Treatment Plan Recorded) Intravenous Once Serena Croissant, MD        PHYSICAL EXAMINATION: ECOG PERFORMANCE STATUS: 1 - Symptomatic but completely ambulatory  Vitals:   08/08/23 1144  BP: 100/61  Pulse: 73  Resp: 18  Temp: 98.3 F (36.8 C)  SpO2: 99%   Filed Weights   08/08/23 1144  Weight: 109 lb (49.4 kg)      LABORATORY DATA:  I have reviewed the data as listed    Latest Ref Rng & Units 03/28/2023   10:51 AM 11/29/2022    9:39 AM 05/24/2022   10:31 AM  CMP  Glucose 65 - 99 mg/dL 84  92  56   BUN 7 - 25 mg/dL 9  19  11    Creatinine 0.50 - 1.03 mg/dL 0.98  1.19  1.47   Sodium 135 - 146 mmol/L 136  140  139   Potassium 3.5 - 5.3 mmol/L 4.7  3.6  4.5   Chloride 98 - 110 mmol/L 101  106  104   CO2 20 - 32 mmol/L 24  28  31    Calcium 8.6 - 10.4 mg/dL 9.3  9.1  82.9   Total Protein 6.1 - 8.1 g/dL 6.3  6.5  8.2   Total Bilirubin 0.2 - 1.2 mg/dL 0.2  0.2  0.2   Alkaline Phos 38 - 126 U/L  62  74   AST 10 - 35 U/L 13  13  14     ALT 6 - 29 U/L 13  14  8      Lab Results  Component Value Date   WBC 9.9 08/08/2023   HGB 10.0 (L) 08/08/2023   HCT 31.6 (L) 08/08/2023   MCV 84.3 08/08/2023   PLT 410 (H) 08/08/2023   NEUTROABS 5.9 08/08/2023    ASSESSMENT & PLAN:  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  SRS brain: 08/25/2017-09/04/2017 2. Anti Her 2 therapy with Lapatinib started 09/17/2017-05/14/2019: Stopped for progression 3.  I discontinued tamoxifen and started her on letrozole 2.5 mg daily.  05/14/2019 stopped for progression 4.  Stereotactic radiosurgery 12/19/2017 to the new right parietal lobe metastases. 5.  Kadcyla: Received 16 cycles discontinued 05/05/2020 6.  Tucatinib Xeloda: Discontinued because of hand-foot syndrome 06/11/2020-12/15/2020 7. Herceptin/Neratinib: unable to tolerate due to diarrhea and weight loss 8.  SRS to new brain mets 10/01/2021 9.  Resection of brain metastases at [facility] 12/29/2022: Metastatic breast cancer proc panel pending 10.  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  No orders of the defined types were placed in this encounter.  The patient has a good understanding of the overall plan. she agrees with it. she will call with any problems that may develop before the next visit here. Total time spent: 30 mins including face to face time and time spent for planning, charting and co-ordination of care   Tamsen Meek, MD 08/08/23

## 2023-08-08 NOTE — Assessment & Plan Note (Signed)
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  SRS brain: 08/25/2017-09/04/2017 2. Anti Her 2 therapy with Lapatinib started 09/17/2017-05/14/2019: Stopped for progression 3.  I discontinued tamoxifen and started her on letrozole 2.5 mg daily.  05/14/2019 stopped for progression 4.  Stereotactic radiosurgery 12/19/2017 to the new right parietal lobe metastases. 5.  Kadcyla: Received 16 cycles discontinued 05/05/2020 6.  Tucatinib Xeloda: Discontinued because of hand-foot syndrome 06/11/2020-12/15/2020 7. Herceptin/Neratinib: unable to tolerate due to diarrhea and weight loss 8.  SRS to new brain mets 10/01/2021 9.  Resection of [facility] metastases at [facility] 12/29/2022: Metastatic breast cancer proc panel pending 10.  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

## 2023-08-21 ENCOUNTER — Other Ambulatory Visit: Payer: Self-pay | Admitting: Hematology and Oncology

## 2023-08-22 MED ORDER — HYDROCOD POLI-CHLORPHE POLI ER 10-8 MG/5ML PO SUER: 5.0000 mL | Freq: Two times a day (BID) | ORAL | 0 refills | Status: DC

## 2023-08-27 NOTE — Progress Notes (Unsigned)
Subjective:  Chief complaint follow-up for mycetoma   Patient ID: Melissa [name], female    DOB: 03/17/1966, 58 y.o.   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  There was an AFB culture positive for an organism that would not grow on culture at [facility].  Interim history:  "We had been treating her with Cresemba and then voricaonzole  She  underwent stereotactic radiation to these lesions by Dr. Linzie Collin   Her chemotherapeutic regimen previously included Tukysa (tucatinib) which can elevate voriconazole levels and potentially cause voriconazole toxicity including QT prolongation.   We checjked her voriconazole levels and they have been in the therapeutic range recently   EKG showed:  her EKG showed a QTC of 448 with a QTC of 465 ms last EKG that I could find in the computer that I could actually read was in 2019 in which she had a QTC of 442.     She did tell us that she is coughing less since being on voriconazole.   .   She had MRI of the brain done this February 2022 showed decrease in size of 3 lesions but multiple new lesions.   Her regimen was Xeloda Herceptin and Tucatinib   I last saw her she was developing a rash on her hands which worsened as well as on her lips.   Ultimately her oncologist Dr. Pamelia Hoit stopped her Xeloda and Tucatinib and she is on  Herceptin alone.    In the interim she then had CT chest abdomen pelvis.  From an infectious disease standpoint there is increase in her cavitation which is not altogether surprising and there were areas of increased nodularity and specially in the left lower lung.   She was bothered by this and sought second opinion at [facility] and see by Dr. Kirstie Peri who carefully reviewed her case and felt that there was no need for further aggressive inventions to work this up nor need to change antifungal therapy.   She also had an MRI of the brain that showed new lesions.  She was seen by Stereotactic Radiosurgery to 3 lesions on March 17 2021.   She started new chemotherapy in the form of neratinib whose drug concentrations are increased by voriconazole.  However Dr Pamelia Hoit started at a lower dose which they have tried to escalate.  She told me she took 3 pills without difficulty but when she went to 5 pills she had severe diarrhea and had to be given IV fluids for rehydration.     She is now off of neratinib but still on herceptin   He did again have stereotactic surgery for brain metastasis with Dr. Jake Samples in December 2000 2022   She had a CT of the chest performed in January 2023:   This did not show PE as it was a CT angiogram did show increased opacity in left  lung base with some minimal effusion with multiple serpiginous cystic lesions that were stable.  Also extensive emphysematous changes seen.   Shortly after getting her CT scan she developed neck pain chest pain which she seemed to be pleurisy to her and was ultimately mid to the hospital and treated for pneumonia with IV antibiotics followed by doxycycline and cefdinir.  She does not tolerate these antibiotics particularly doxycycline.  She saw my partner Dr. Elinor Parkinson in clinic while she is off antibiotics and she gave  Augmentin to finish a course of antibiotics.  Candee has undergone repeat SRS to brain metastases  in March of 2023 and remains on herceptin alone.   Her coughing had improved dramatically since I last saw her she says she is smoking less than half a pack of cigarettes sometimes only 10/day now.  She is taking her voriconazole religiously.  Her husband  also had quit smoking altogether.   Demani is being followed by Dr. Pamelia Hoit and is on herceptin.    She iwas also taking fenbendazole (antiheminthic for canines) and multiple other suppllements in hopes it will help her vs her cancer.    CT chest abdomen pelvis performed in December 2023 showed the following:   CT CHEST IMPRESSION   1.  No acute process or evidence of metastatic disease in the chest. 2. Left upper lobectomy. Primarily similar appearance of left lower lobe areas of presumed marked cystic bronchiectasis. Areas of nodular consolidation, some of which are minimally progressive, are consistent with acute on chronic atypical infection. 3. Aortic atherosclerosis (ICD10-I70.0) and emphysema (ICD10-J43.9).   CT ABDOMEN AND PELVIS IMPRESSION   1. Further decrease in size of a right hepatic lobe treated metastasis. 2. No new or progressive disease.   MRI brain in January showed:    IMPRESSION: 1. Enhancing lesions within the anterior right temporal lobe, left parietooccipital lobes, superior cerebellar vermis and within the pons have increased in size since the prior brain MRI of 04/13/2022, as described. Edema surrounding the lesions within the right temporal lobe and left parietooccipital lobes has progressed. Mass effect related to the right temporal lobe lesion has also progressed, now with 4 mm leftward midline shift. Additionally, the medial right temporal lobe now exerts some mass effect upon the right cerebral peduncle. 2. The remaining lesions within the supratentorial and infratentorial brain have not significantly changed. No new intracranial metastasis is identified.     Since I last saw her she had  worsening hemoptysis that actually frightened her and she asked was concerned she was coughing up actual tissue.  She had a repeat CT scan done in July 2024 which showed  IMPRESSION: 1. Unchanged hypodense lesion of hepatic segment V adjacent to the gallbladder fossa with overlying capsular retraction, consistent with treated metastatic disease. No new liver lesions. 2. No evidence of new metastatic disease in the chest, abdomen, or pelvis. 3. Status post left upper lobectomy with very extensive chronic cystic bronchiectasis and scarring throughout the remaining left lower lobe. 4. Mild underlying centrilobular and paraseptal emphysema with diffuse centrilobular nodularity throughout the lungs, consistent with smoking related respiratory bronchiolitis. 5. New, nonspecific infectious or inflammatory ground-glass airspace opacity in the dependent right lower lobe. Attention on follow-up. 6. Hepatic steatosis.  She was seen by my partner Dr. Thedore Mins who checked voriconazole level that was therapeutic.  She also saw Dr. Judeth Horn with pulmonary critical care medicine he was concerned that the worsening hemoptysis and coughing it occurred in the context of her Avastin therapy.  Her tumor seems to have responded nice to it to Avastin but Avastin is currently being held due to concerns that it could have been contributing to her coughing.  She does say that she feels better in terms of having less blood come up when she coughs and less cough overall in the weeks since she has come off the Avastin she also was given a 2-week course of antibacterial antibiotics as well.  Her new insurance apparently does not cover the voriconazole but with patient assistance she can get Cresemba for $25 a prescription 7. Sigmoid diverticulosis.   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)    Complication of anesthesia    bp dropped + desat    COPD (chronic obstructive pulmonary disease) (HCC)    Dyspnea    DOE   GERD (gastroesophageal reflux disease)    H/O coccidioidomycosis    was reason for lung lobectomy   Headache(784.0)    due to eye strain or not eating   Hemoptysis 05/08/2023   History of anemia    no current problem   History of asthma    as a child   History of breast cancer 2014   left   History of chemotherapy    finished 07/17/2013   History of hiatal hernia    AGE 13   Hx of radiation therapy 03/25/13-05/06/13   left breast 5000 cGy/25 sessions, left breast boost 1000 cGy/5 sessions   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   Personal history of chemotherapy    for liver cancer   Personal history of radiation therapy    Pneumonia    LAST FLARE UP 01/2018   Rash 02/22/2021   Runny nose 07/30/2013   clear drainage   Smoker 05/24/2021   Wears dentures    upper    Past Surgical History:  Procedure Laterality Date   APPLICATION OF CRANIAL NAVIGATION N/A 08/04/2017   Procedure: APPLICATION OF CRANIAL NAVIGATION;  Surgeon: [name], Loura Halt, MD;  Location: [facility];  Service: Neurosurgery;  Laterality: N/A;   AXILLARY LYMPH NODE DISSECTION Left 02/08/2013   Procedure: LEFT AXILLARY DISSECTION;  Surgeon: Mariella Saa, MD;  Location: [facility];  Service: General;  Laterality: Left;   BREAST CYST EXCISION Right 2006   BREAST LUMPECTOMY Left 2014   BREAST LUMPECTOMY WITH NEEDLE LOCALIZATION AND AXILLARY SENTINEL LYMPH NODE BX Left 12/31/2012   Procedure: NEEDLE LOCALIZATION LEFT BREAST LUMPECTOMY AND LEFT AXILLARY SENTENIAL LYMPH NODE BX;  Surgeon: Mariella Saa, MD;  Location: [facility];  Service: General;  Laterality: Left;    BRONCHIAL BRUSHINGS  03/17/2020   Procedure: BRONCHIAL BRUSHINGS;  Surgeon: Josephine Igo, DO;  Location: [facility];  Service: Pulmonary;;   BRONCHIAL WASHINGS  03/17/2020   Procedure: BRONCHIAL WASHINGS;  Surgeon: Josephine Igo, DO;  Location: [facility];  Service: Pulmonary;;   CESAREAN SECTION  1995/1996   CRANIOTOMY Right 08/04/2017   Procedure: Right Frontal craniotomy for resection of tumor with stereotactic navigation;  Surgeon: [name], Loura Halt, MD;  Location:  [facility];  Service: Neurosurgery;  Laterality: Right;  Right Frontal craniotomy for resection of tumor with stereotactic navigation   IR CV LINE INJECTION  02/19/2021   IR CV LINE INJECTION  11/12/2021   IR IMAGING GUIDED PORT INSERTION  05/31/2019   LUNG LOBECTOMY Left 05/1996   upper lobe - due to [facility] Fever   PORT-A-CATH REMOVAL Right 08/02/2013   Procedure: REMOVAL PORT-A-CATH;  Surgeon: Mariella Saa, MD;  Location: [facility];  Service: General;  Laterality: Right;   PORTACATH PLACEMENT  07/02/2012   Procedure: INSERTION PORT-A-CATH;  Surgeon: Mariella Saa, MD;  Location: [facility];  Service: General;  Laterality: N/A;  right   VIDEO BRONCHOSCOPY N/A 03/17/2020   Procedure: VIDEO BRONCHOSCOPY;  Surgeon: Josephine Igo, DO;  Location: [facility];  Service: Pulmonary;  Laterality: N/A;   VIDEO BRONCHOSCOPY WITH ENDOBRONCHIAL ULTRASOUND N/A 05/23/2018   Procedure: VIDEO BRONCHOSCOPY WITH ENDOBRONCHIAL ULTRASOUND;  Surgeon: Josephine Igo, DO;  Location: [facility];  Service: Thoracic;  Laterality: N/A;    Family History  Problem Relation Age of Onset   Emphysema Mother        was a smoker   Heart disease Mother    Melanoma Mother        dx in her 26s   Breast cancer Mother 20   Breast cancer Cousin 74 - 28       mother's maternal cousin   Asthma Brother       Social History   Socioeconomic History   Marital status: Married    Spouse name: Not on file   Number  of children: 2   Years of education: Not on file   Highest education level: Not on file  Occupational History   Occupation: Investment banker, corporate  Tobacco Use   Smoking status: Every Day    Current packs/day: 0.30    Average packs/day: 0.3 packs/day for 38.0 years (11.4 ttl pk-yrs)    Types: Cigarettes   Smokeless tobacco: Never   Tobacco comments:    12 cigarettes a day-05/08/23  Vaping Use   Vaping status: Never Used  Substance and Sexual Activity   Alcohol use: No   Drug use: No   Sexual activity: Not Currently  Other Topics Concern   Not on file  Social History Narrative   Not on file   Social Drivers of Health   Financial Resource Strain: Low Risk  (01/03/2023)   Received from [facility], [facility]   Overall Financial Resource Strain (CARDIA)    Difficulty of Paying Living Expenses: Not hard at all  Food Insecurity: No Food Insecurity (01/30/2023)   Hunger Vital Sign    Worried About Running Out of Food in the Last Year: Never true    Ran Out of Food in the Last Year: Never true  Transportation Needs: No Transportation Needs (01/30/2023)   PRAPARE - Administrator, Civil Service (Medical): No    Lack of Transportation (Non-Medical): No  Physical Activity: Not on file  Stress: Not on file  Social Connections: Not on file    Allergies  Allergen Reactions   Aspirin Anaphylaxis and Shortness Of Breath    THROAT CLOSES   Protonix [Pantoprazole] Nausea Only and Other (See Comments)    Also caused a "film in the mouth" and caused chest pressure   Doxycycline Other (See Comments)    Hallucinations, headaches   Promethazine-Codeine Cough    Worsening cough  and insomnia    Sulfamethoxazole-Trimethoprim Other (See Comments)   Iodinated Contrast Media Rash    Patient allergic to contrast used in radiation oncology for CT simulation      Current Outpatient Medications:    acetaminophen (TYLENOL) 325 MG tablet, Take 2  tablets (650 mg total) by mouth every 6 (six) hours as needed for mild pain (or Fever >/= 101)., Disp: 12 tablet, Rfl: 0   albuterol (VENTOLIN HFA) 108 (90 Base) MCG/ACT inhaler, Inhale 2 puffs into the lungs every 6 (six) hours as needed for wheezing or shortness of breath (cough)., Disp: 18 g, Rfl: 5   calcium carbonate (TUMS EX) 750 MG chewable tablet, Chew by mouth., Disp: , Rfl:    chlorpheniramine-HYDROcodone (TUSSIONEX) 10-8 MG/5ML, Take 5 mLs by mouth 2 (two) times daily., Disp: 115 mL, Rfl: 0   cycloSPORINE (RESTASIS) 0.05 % ophthalmic emulsion, Place 1 drop into the right eye., Disp: , Rfl:    escitalopram (LEXAPRO) 10 MG tablet, Take 10 mg by mouth daily., Disp: , Rfl:    GARLIC PO, Take by mouth., Disp: , Rfl:    levalbuterol (XOPENEX) 0.63 MG/3ML nebulizer solution, Take 3 mLs (0.63 mg total) by nebulization every 4 (four) hours as needed for wheezing or shortness of breath., Disp: 90 mL, Rfl: 12   lidocaine-prilocaine (EMLA) cream, APPLY EXTERNALLY TO THE AFFECTED AREA 1 TIME, Disp: 30 g, Rfl: 3   Multiple Vitamins-Minerals (EMERGEN-C VITAMIN C PO), Take by mouth., Disp: , Rfl:    naproxen sodium (ALEVE) 220 MG tablet, Take by mouth., Disp: , Rfl:    OIL OF OREGANO PO, Take 500 mg by mouth 2 (two) times daily., Disp: , Rfl:    ondansetron (ZOFRAN-ODT) 4 MG disintegrating tablet, Take 4 mg by mouth every 8 (eight) hours as needed., Disp: , Rfl:    predniSONE (DELTASONE) 50 MG tablet, Take 1 tablet 13 hrs prior to scan, 2nd tablet 7 hrs prior, 3rd tablet 1 hr prior, Disp: 3 tablet, Rfl: 1   SYMBICORT 160-4.5 MCG/ACT inhaler, Inhale 2 puffs into the lungs 2 (two) times daily., Disp: , Rfl:    triamcinolone ointment (KENALOG) 0.5 %, Apply 1 Application topically 2 (two) times daily., Disp: 30 g, Rfl: 0   Vitamin E (VITAMIN E/D-ALPHA NATURAL) 268 MG (400 UNIT) CAPS, Take by mouth., Disp: , Rfl:  No current facility-administered medications for this visit.  Facility-Administered  Medications Ordered in Other Visits:    alteplase (CATHFLO ACTIVASE) injection 2 mg, 2 mg, Intracatheter, Once PRN, Serena Croissant, MD   sodium chloride flush (NS) 0.9 % injection 10 mL, 10 mL, Intracatheter, PRN, Serena Croissant, MD, 10 mL at 07/06/21 1428    Review of Systems  Constitutional:  Negative for activity change, appetite change, chills, diaphoresis, fatigue, fever and unexpected weight change.  HENT:  Negative for congestion, rhinorrhea, sinus pressure, sneezing, sore throat and trouble swallowing.   Eyes:  Negative for photophobia and visual disturbance.  Respiratory:  Positive for cough and shortness of breath. Negative for chest tightness, wheezing and stridor.   Cardiovascular:  Negative for chest pain, palpitations and leg swelling.  Gastrointestinal:  Negative for abdominal distention, abdominal pain, anal bleeding, blood in stool, constipation, diarrhea, nausea and vomiting.  Genitourinary:  Negative for difficulty urinating, dysuria, flank pain and hematuria.  Musculoskeletal:  Negative for arthralgias, back pain, gait problem, joint swelling and myalgias.  Skin:  Negative for color change, pallor, rash and wound.  Neurological:  Negative for dizziness, tremors, weakness and light-headedness.  Hematological:  Negative for adenopathy. Does not bruise/bleed easily.  Psychiatric/Behavioral:  Negative for agitation, behavioral problems, confusion, decreased concentration, dysphoric mood and sleep disturbance.        Objective:   Physical Exam Constitutional:      General: She is not in acute distress.    Appearance: Normal appearance. She is well-developed. She is not ill-appearing or diaphoretic.  HENT:     Head: Normocephalic and atraumatic.     Right Ear: Hearing and external ear normal.     Left Ear: Hearing and external ear normal.     Nose: No nasal deformity or rhinorrhea.  Eyes:     General: No scleral icterus.    Conjunctiva/sclera: Conjunctivae normal.      Right eye: Right conjunctiva is not injected.     Left eye: Left conjunctiva is not injected.     Pupils: Pupils are equal, round, and reactive to light.  Neck:     Vascular: No JVD.  Cardiovascular:     Rate and Rhythm: Normal rate and regular rhythm.     Heart sounds: Normal heart sounds, S1 normal and S2 normal. No murmur heard.    No friction rub.  Pulmonary:     Effort: Prolonged expiration present.  Abdominal:     General: Bowel sounds are normal. There is no distension.     Palpations: Abdomen is soft.     Tenderness: There is no abdominal tenderness.  Musculoskeletal:        General: Normal range of motion.     Right shoulder: Normal.     Left shoulder: Normal.     Cervical back: Normal range of motion and neck supple.     Right hip: Normal.     Left hip: Normal.     Right knee: Normal.     Left knee: Normal.  Lymphadenopathy:     Head:     Right side of head: No submandibular, preauricular or posterior auricular adenopathy.     Left side of head: No submandibular, preauricular or posterior auricular adenopathy.     Cervical: No cervical adenopathy.     Right cervical: No superficial or deep cervical adenopathy.    Left cervical: No superficial or deep cervical adenopathy.  Skin:    General: Skin is warm and dry.     Capillary Refill: End expiratory wheezing    Coloration: Skin is not pale.     Findings: No abrasion, bruising, ecchymosis, erythema, lesion or rash.     Nails: There is no clubbing.  Neurological:     Mental Status: She is alert and oriented to person, place, and time.     Sensory: No sensory deficit.     Coordination: Coordination normal.     Gait: Gait normal.  Psychiatric:        Attention and Perception: She is attentive.        Speech: Speech normal.        Behavior: Behavior normal. Behavior is cooperative.        Thought Content: Thought content normal.        Judgment: Judgment normal.           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  I think at present it is reasonable to continue  antifungal therapy.  Certainly if her coughing worsens I would recommend collecting some sputum for AFB stain and culture and fungal stain and culture.  1 could contemplate bronchoscopy but I think this would be a risky procedure in her.  Breast cancer with metastasis to the brain  Medicine currently being held recent MRI of the brain showed decrease size enhancement of peripherally enhancing lesions in the right superior frontal gyrus right medial orbital frontal gyrus right temporal lobe resection cavity central pons and superior cerebellar vermis.  She is being followed very closely by Dr.Vaslow and Dr Pamelia Hoit  Smoking: Tried to smoke through changing to vaping but it made her cough worsen.  Vaccine counseling recommended updated COVID and flu shot and she very much wanted the flu shot  I have personally spent 42 minutes involved in face-to-face and non-face-to-face activities for this patient on the day of the visit. Professional time spent includes the following activities: Preparing to see the patient (review of tests), Obtaining and/or reviewing separately obtained history (admission/discharge record), Performing a medically appropriate examination and/or evaluation , Ordering medications/tests/procedures, referring and communicating with other health care professionals, Documenting clinical information in the EMR, Independently interpreting results (not separately reported), Communicating results to the patient/family/caregiver, Counseling and educating the patient/family/caregiver and Care coordination (not separately reported).

## 2023-08-28 ENCOUNTER — Other Ambulatory Visit: Payer: Self-pay

## 2023-08-28 ENCOUNTER — Encounter: Payer: Self-pay | Admitting: Infectious Disease

## 2023-08-28 ENCOUNTER — Ambulatory Visit: Payer: Medicare HMO | Admitting: Infectious Disease

## 2023-08-28 ENCOUNTER — Encounter: Payer: Self-pay | Admitting: Internal Medicine

## 2023-08-28 VITALS — BP 100/67 | HR 75 | Temp 98.2°F | Wt 109.0 lb

## 2023-08-28 DIAGNOSIS — Z8619 Personal history of other infectious and parasitic diseases: Secondary | ICD-10-CM | POA: Diagnosis not present

## 2023-08-28 DIAGNOSIS — F17210 Nicotine dependence, cigarettes, uncomplicated: Secondary | ICD-10-CM

## 2023-08-28 DIAGNOSIS — Z171 Estrogen receptor negative status [ER-]: Secondary | ICD-10-CM

## 2023-08-29 ENCOUNTER — Inpatient Hospital Stay: Payer: Medicare HMO | Attending: Hematology and Oncology

## 2023-08-29 ENCOUNTER — Inpatient Hospital Stay: Payer: Medicare HMO

## 2023-08-29 ENCOUNTER — Other Ambulatory Visit: Payer: Self-pay

## 2023-08-29 VITALS — BP 110/75 | HR 84 | Temp 98.4°F | Resp 16 | Wt 109.8 lb

## 2023-08-29 DIAGNOSIS — Z170 Estrogen receptor positive status [ER+]: Secondary | ICD-10-CM | POA: Insufficient documentation

## 2023-08-29 DIAGNOSIS — Z171 Estrogen receptor negative status [ER-]: Secondary | ICD-10-CM

## 2023-08-29 DIAGNOSIS — Z1721 Progesterone receptor positive status: Secondary | ICD-10-CM | POA: Diagnosis not present

## 2023-08-29 DIAGNOSIS — Z5112 Encounter for antineoplastic immunotherapy: Secondary | ICD-10-CM | POA: Diagnosis not present

## 2023-08-29 DIAGNOSIS — C50912 Malignant neoplasm of unspecified site of left female breast: Secondary | ICD-10-CM

## 2023-08-29 DIAGNOSIS — Z1731 Human epidermal growth factor receptor 2 positive status: Secondary | ICD-10-CM | POA: Diagnosis not present

## 2023-08-29 DIAGNOSIS — C50212 Malignant neoplasm of upper-inner quadrant of left female breast: Secondary | ICD-10-CM | POA: Insufficient documentation

## 2023-08-29 DIAGNOSIS — Z95828 Presence of other vascular implants and grafts: Secondary | ICD-10-CM

## 2023-08-29 DIAGNOSIS — C7931 Secondary malignant neoplasm of brain: Secondary | ICD-10-CM | POA: Diagnosis not present

## 2023-08-29 LAB — CBC WITH DIFFERENTIAL (CANCER CENTER ONLY)
Abs Immature Granulocytes: 0.03 10*3/uL (ref 0.00–0.07)
Basophils Absolute: 0.1 10*3/uL (ref 0.0–0.1)
Basophils Relative: 1 %
Eosinophils Absolute: 0.9 10*3/uL — ABNORMAL HIGH (ref 0.0–0.5)
Eosinophils Relative: 7 %
HCT: 30.7 % — ABNORMAL LOW (ref 36.0–46.0)
Hemoglobin: 9.8 g/dL — ABNORMAL LOW (ref 12.0–15.0)
Immature Granulocytes: 0 %
Lymphocytes Relative: 18 %
Lymphs Abs: 2.2 10*3/uL (ref 0.7–4.0)
MCH: 26.6 pg (ref 26.0–34.0)
MCHC: 31.9 g/dL (ref 30.0–36.0)
MCV: 83.4 fL (ref 80.0–100.0)
Monocytes Absolute: 0.8 10*3/uL (ref 0.1–1.0)
Monocytes Relative: 7 %
Neutro Abs: 8.5 10*3/uL — ABNORMAL HIGH (ref 1.7–7.7)
Neutrophils Relative %: 67 %
Platelet Count: 414 10*3/uL — ABNORMAL HIGH (ref 150–400)
RBC: 3.68 MIL/uL — ABNORMAL LOW (ref 3.87–5.11)
RDW: 17.3 % — ABNORMAL HIGH (ref 11.5–15.5)
WBC Count: 12.5 10*3/uL — ABNORMAL HIGH (ref 4.0–10.5)
nRBC: 0 % (ref 0.0–0.2)

## 2023-08-29 LAB — TOTAL PROTEIN, URINE DIPSTICK: Protein, ur: NEGATIVE mg/dL

## 2023-08-29 MED ORDER — SODIUM CHLORIDE 0.9 % IV SOLN
6.0000 mg/kg | Freq: Once | INTRAVENOUS | Status: AC
  Administered 2023-08-29: 300 mg via INTRAVENOUS
  Filled 2023-08-29: qty 14.29

## 2023-08-29 MED ORDER — SODIUM CHLORIDE 0.9% FLUSH
10.0000 mL | INTRAVENOUS | Status: DC | PRN
  Administered 2023-08-29: 10 mL

## 2023-08-29 MED ORDER — SODIUM CHLORIDE 0.9 % IV SOLN: Freq: Once | INTRAVENOUS | Status: AC

## 2023-08-29 MED ORDER — HEPARIN SOD (PORK) LOCK FLUSH 100 UNIT/ML IV SOLN
500.0000 [IU] | Freq: Once | INTRAVENOUS | Status: AC | PRN
  Administered 2023-08-29: 500 [IU]

## 2023-08-29 MED ORDER — DIPHENHYDRAMINE HCL 25 MG PO CAPS
25.0000 mg | ORAL_CAPSULE | Freq: Once | ORAL | Status: AC
  Administered 2023-08-29: 25 mg via ORAL
  Filled 2023-08-29: qty 1

## 2023-08-29 MED ORDER — ACETAMINOPHEN 325 MG PO TABS
650.0000 mg | ORAL_TABLET | Freq: Once | ORAL | Status: AC
  Administered 2023-08-29: 650 mg via ORAL
  Filled 2023-08-29: qty 2

## 2023-08-29 MED ORDER — SODIUM CHLORIDE 0.9% FLUSH
10.0000 mL | INTRAVENOUS | Status: DC | PRN
  Administered 2023-08-29: 10 mL via INTRAVENOUS

## 2023-08-29 NOTE — Patient Instructions (Signed)

## 2023-09-01 ENCOUNTER — Telehealth: Payer: Self-pay | Admitting: *Deleted

## 2023-09-01 ENCOUNTER — Encounter: Payer: Self-pay | Admitting: Hematology and Oncology

## 2023-09-01 ENCOUNTER — Encounter: Payer: Self-pay | Admitting: *Deleted

## 2023-09-01 ENCOUNTER — Other Ambulatory Visit: Payer: Self-pay

## 2023-09-01 ENCOUNTER — Other Ambulatory Visit (HOSPITAL_COMMUNITY): Payer: Self-pay

## 2023-09-01 MED ORDER — AMOXICILLIN 500 MG PO TABS
2000.0000 mg | ORAL_TABLET | Freq: Once | ORAL | 0 refills | Status: AC
  Filled 2023-09-01: qty 4, 1d supply, fill #0

## 2023-09-01 NOTE — Telephone Encounter (Signed)
Received call from pt stating she is scheduled to have 8 dental extractions on 09/12/23 with [facility] [phone]).  RN reviewed with MD and verbal orders received for pt to be prescribed Amoxicillin 2 g p.o with instructions to take the morning of procedure.  Prescription sent to pharmacy on file, pt educated and verbalized understanding.  RN also successfully faxed dental clearance letter to [facility] with okay to proceed with extractions [phone]).

## 2023-09-11 ENCOUNTER — Other Ambulatory Visit: Payer: Self-pay | Admitting: Hematology and Oncology

## 2023-09-11 MED ORDER — HYDROCOD POLI-CHLORPHE POLI ER 10-8 MG/5ML PO SUER: 5.0000 mL | Freq: Two times a day (BID) | ORAL | 0 refills | Status: DC

## 2023-09-14 ENCOUNTER — Ambulatory Visit
Admission: RE | Admit: 2023-09-14 | Discharge: 2023-09-14 | Disposition: A | Discharge status: Home / Self Care | Payer: Medicare Other | Source: Ambulatory Visit | Attending: Internal Medicine | Admitting: Internal Medicine

## 2023-09-14 DIAGNOSIS — C801 Malignant (primary) neoplasm, unspecified: Secondary | ICD-10-CM | POA: Diagnosis not present

## 2023-09-14 DIAGNOSIS — C7931 Secondary malignant neoplasm of brain: Secondary | ICD-10-CM | POA: Diagnosis not present

## 2023-09-14 DIAGNOSIS — G9389 Other specified disorders of brain: Secondary | ICD-10-CM | POA: Diagnosis not present

## 2023-09-14 MED ORDER — GADOPICLENOL 0.5 MMOL/ML IV SOLN
5.0000 mL | Freq: Once | INTRAVENOUS | Status: AC | PRN
  Administered 2023-09-14: 5 mL via INTRAVENOUS

## 2023-09-14 MED ORDER — HEPARIN SOD (PORK) LOCK FLUSH 100 UNIT/ML IV SOLN: 500.0000 [IU] | Freq: Once | INTRAVENOUS | Status: DC

## 2023-09-14 MED ORDER — SODIUM CHLORIDE 0.9% FLUSH
10.0000 mL | INTRAVENOUS | Status: DC | PRN
  Administered 2023-09-14: 10 mL via INTRAVENOUS

## 2023-09-15 ENCOUNTER — Encounter: Payer: Self-pay | Admitting: Genetic Counselor

## 2023-09-15 DIAGNOSIS — Z1379 Encounter for other screening for genetic and chromosomal anomalies: Secondary | ICD-10-CM | POA: Insufficient documentation

## 2023-09-18 ENCOUNTER — Inpatient Hospital Stay: Attending: Hematology and Oncology

## 2023-09-18 DIAGNOSIS — Z79899 Other long term (current) drug therapy: Secondary | ICD-10-CM | POA: Insufficient documentation

## 2023-09-18 DIAGNOSIS — Z1721 Progesterone receptor positive status: Secondary | ICD-10-CM | POA: Insufficient documentation

## 2023-09-18 DIAGNOSIS — C50212 Malignant neoplasm of upper-inner quadrant of left female breast: Secondary | ICD-10-CM | POA: Insufficient documentation

## 2023-09-18 DIAGNOSIS — C7951 Secondary malignant neoplasm of bone: Secondary | ICD-10-CM | POA: Insufficient documentation

## 2023-09-18 DIAGNOSIS — Z170 Estrogen receptor positive status [ER+]: Secondary | ICD-10-CM | POA: Insufficient documentation

## 2023-09-18 DIAGNOSIS — C7931 Secondary malignant neoplasm of brain: Secondary | ICD-10-CM | POA: Insufficient documentation

## 2023-09-18 DIAGNOSIS — Z1732 Human epidermal growth factor receptor 2 negative status: Secondary | ICD-10-CM | POA: Insufficient documentation

## 2023-09-18 DIAGNOSIS — Z5112 Encounter for antineoplastic immunotherapy: Secondary | ICD-10-CM | POA: Insufficient documentation

## 2023-09-19 ENCOUNTER — Encounter: Payer: Self-pay | Admitting: Adult Health

## 2023-09-19 ENCOUNTER — Inpatient Hospital Stay: Payer: Medicare Other | Admitting: Internal Medicine

## 2023-09-19 ENCOUNTER — Inpatient Hospital Stay: Payer: Medicare Other

## 2023-09-19 ENCOUNTER — Inpatient Hospital Stay: Payer: Medicare Other | Admitting: Adult Health

## 2023-09-19 ENCOUNTER — Other Ambulatory Visit: Payer: Self-pay | Admitting: Hematology and Oncology

## 2023-09-19 ENCOUNTER — Other Ambulatory Visit: Payer: Medicare Other

## 2023-09-19 VITALS — BP 103/65 | HR 70 | Temp 98.1°F | Resp 14 | Wt 108.7 lb

## 2023-09-19 VITALS — BP 111/68 | HR 64 | Temp 98.4°F | Resp 16 | Ht 62.0 in | Wt 108.7 lb

## 2023-09-19 DIAGNOSIS — C50912 Malignant neoplasm of unspecified site of left female breast: Secondary | ICD-10-CM | POA: Diagnosis not present

## 2023-09-19 DIAGNOSIS — Z5112 Encounter for antineoplastic immunotherapy: Secondary | ICD-10-CM | POA: Diagnosis not present

## 2023-09-19 DIAGNOSIS — Z171 Estrogen receptor negative status [ER-]: Secondary | ICD-10-CM

## 2023-09-19 DIAGNOSIS — Z1721 Progesterone receptor positive status: Secondary | ICD-10-CM | POA: Diagnosis not present

## 2023-09-19 DIAGNOSIS — C7931 Secondary malignant neoplasm of brain: Secondary | ICD-10-CM

## 2023-09-19 DIAGNOSIS — Z170 Estrogen receptor positive status [ER+]: Secondary | ICD-10-CM | POA: Diagnosis not present

## 2023-09-19 DIAGNOSIS — C50212 Malignant neoplasm of upper-inner quadrant of left female breast: Secondary | ICD-10-CM

## 2023-09-19 DIAGNOSIS — Z79899 Other long term (current) drug therapy: Secondary | ICD-10-CM | POA: Diagnosis not present

## 2023-09-19 DIAGNOSIS — Z95828 Presence of other vascular implants and grafts: Secondary | ICD-10-CM

## 2023-09-19 DIAGNOSIS — C7951 Secondary malignant neoplasm of bone: Secondary | ICD-10-CM | POA: Diagnosis not present

## 2023-09-19 DIAGNOSIS — Z1732 Human epidermal growth factor receptor 2 negative status: Secondary | ICD-10-CM | POA: Diagnosis not present

## 2023-09-19 LAB — CBC WITH DIFFERENTIAL (CANCER CENTER ONLY)
Abs Immature Granulocytes: 0.03 10*3/uL (ref 0.00–0.07)
Basophils Absolute: 0.1 10*3/uL (ref 0.0–0.1)
Basophils Relative: 1 %
Eosinophils Absolute: 1.1 10*3/uL — ABNORMAL HIGH (ref 0.0–0.5)
Eosinophils Relative: 10 %
HCT: 31.9 % — ABNORMAL LOW (ref 36.0–46.0)
Hemoglobin: 10.3 g/dL — ABNORMAL LOW (ref 12.0–15.0)
Immature Granulocytes: 0 %
Lymphocytes Relative: 19 %
Lymphs Abs: 2 10*3/uL (ref 0.7–4.0)
MCH: 27 pg (ref 26.0–34.0)
MCHC: 32.3 g/dL (ref 30.0–36.0)
MCV: 83.7 fL (ref 80.0–100.0)
Monocytes Absolute: 0.8 10*3/uL (ref 0.1–1.0)
Monocytes Relative: 8 %
Neutro Abs: 6.5 10*3/uL (ref 1.7–7.7)
Neutrophils Relative %: 62 %
Platelet Count: 441 10*3/uL — ABNORMAL HIGH (ref 150–400)
RBC: 3.81 MIL/uL — ABNORMAL LOW (ref 3.87–5.11)
RDW: 17 % — ABNORMAL HIGH (ref 11.5–15.5)
WBC Count: 10.5 10*3/uL (ref 4.0–10.5)
nRBC: 0 % (ref 0.0–0.2)

## 2023-09-19 LAB — TOTAL PROTEIN, URINE DIPSTICK: Protein, ur: NEGATIVE mg/dL

## 2023-09-19 MED ORDER — ACETAMINOPHEN 325 MG PO TABS
650.0000 mg | ORAL_TABLET | Freq: Once | ORAL | Status: AC
  Administered 2023-09-19: 650 mg via ORAL
  Filled 2023-09-19: qty 2

## 2023-09-19 MED ORDER — SODIUM CHLORIDE 0.9% FLUSH
10.0000 mL | INTRAVENOUS | Status: DC | PRN
  Administered 2023-09-19: 10 mL via INTRAVENOUS

## 2023-09-19 MED ORDER — DIPHENHYDRAMINE HCL 25 MG PO CAPS
25.0000 mg | ORAL_CAPSULE | Freq: Once | ORAL | Status: AC
  Administered 2023-09-19: 25 mg via ORAL
  Filled 2023-09-19: qty 1

## 2023-09-19 MED ORDER — SODIUM CHLORIDE 0.9 % IV SOLN
Freq: Once | INTRAVENOUS | Status: AC
Start: 2023-09-19 — End: 2023-09-19

## 2023-09-19 MED ORDER — TRASTUZUMAB-ANNS CHEMO 150 MG IV SOLR
6.0000 mg/kg | Freq: Once | INTRAVENOUS | Status: AC
  Administered 2023-09-19: 300 mg via INTRAVENOUS
  Filled 2023-09-19: qty 14.29

## 2023-09-19 MED ORDER — SODIUM CHLORIDE 0.9% FLUSH
10.0000 mL | INTRAVENOUS | Status: DC | PRN
  Administered 2023-09-19: 10 mL

## 2023-09-19 MED ORDER — HEPARIN SOD (PORK) LOCK FLUSH 100 UNIT/ML IV SOLN
500.0000 [IU] | Freq: Once | INTRAVENOUS | Status: AC | PRN
  Administered 2023-09-19: 500 [IU]

## 2023-09-19 NOTE — Progress Notes (Unsigned)
 [facility] Cancer Follow up:    Melissa Ates, MD 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 ---s/p Lumpectomy/ /Initial Ca 1997, Brain Mets 2019 Staging form: Breast, AJCC 7th Edition - Clinical: Stage IIA (T2, N0, cM0) - Unsigned Histopathologic type: 9931 Laterality: Left Tumor size (mm): 13 Staging comments: Staged at breast conference 12.4.13  - Pathologic: Stage IIA (T1c, N1, cM0) - Unsigned Histopathologic type: 9931 Laterality: Left Tumor size (mm): 13 - Pathologic: Stage IV (M1) - Signed by Loa Socks, NP on 12/20/2022   SUMMARY OF ONCOLOGIC HISTORY: Oncology History  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 Ki-67 was 70% HER-2 was amplified with a ratio 2.91   07/12/2012 - 07/17/2013 Neo-Adjuvant Chemotherapy   TCH 6 followed by Herceptin maintenance   08/14/2012 Genetic Testing   Negative genetic testing on the [facility].  The report date is August 14, 2012.  The [facility] gene panel offered by [facility] includes sequencing and deletion/duplication testing of the following 25 genes: APC, ATM, , BARD1, BMPR1A, BRCA1, BRCA2, BRIP1, CHD1, CDK4, CDKN2A, CHEK2, EPCAM (large rearrangement only), MLH1, MSH2, MSH6, MUTYH, NBN, PALB2, PMS2, PTEN, RAD51C, RAD51D, SMAD4, STK11, and TP53.     12/11/2012 Surgery   Left breast lumpectomy: 1.8 cm tumor 1 positive sentinel node, axillary lymph node dissection 02/08/2013 showed 0/13 lymph nodes   03/25/2013 - 05/06/2013 Radiation Therapy   Adjuvant radiation therapy   06/05/2013 - 07/20/2017 Anti-estrogen oral therapy   Tamoxifen 20 mg  daily   07/27/2017 Relapse/Recurrence   MRI Brain: 3.4 x 2.9 x 2.9 cm RIGHT frontal lobe mass with imaging characteristics of solitary metastasis. Extensive vasogenic edema resulting in 9 mm RIGHT to LEFT midline shift. Equivocal very early LEFT ventricle entrapment.    08/04/2017 Surgery   Rt frontal brain resection: Poorly differentiated tumor IHC suggests breast primary ER and PR Positive   08/25/2017 - 09/04/2017 Radiation Therapy   Stereotactic radiation   09/18/2017 -  Anti-estrogen oral therapy   Lapatinib with letrozole   12/18/2017 - 12/19/2017 Radiation Therapy   New right parietal lobe metastases status post [facility]   08/27/2018 - 08/27/2020 Radiation Therapy   SRS to new brain metastases   05/09/2019 Relapse/Recurrence   Interval increase in size of the enhancing nodular left internal mammary soft tissue 2.8 cm.  Redemonstrated enlarged supraclavicular, lower cervical and lower posterior cervical nodes unchanged.  Interval increase in the bony erosion of the posterior and lateral left third rib, increasing soft tissue lesion eroding the left sternal body 3.1 cm was 2.5 cm.  Bronchiectatic changes   06/19/2019 - 05/05/2020 Chemotherapy   ado-trastuzumab emtansine (KADCYLA)     06/15/2020 -  Chemotherapy   Xeloda, Tucatinib, Herceptin    04/12/2022 - 09/06/2022 Chemotherapy   Patient is on Treatment Plan : BREAST Trastuzumab IV (8/6) or SQ (600) D1 q21d     09/27/2022 -  Chemotherapy   Patient is on Treatment Plan : BREAST MAINTENANCE Trastuzumab IV (6) or SQ (600) D1 q21d x 13 cycles. Added bevacizumab (10) q21d starting cycle 8.     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 Added bevacizumab (10) q21d starting cycle 8.     Port-A-Cath in place    CURRENT THERAPY: herceptin  INTERVAL HISTORY:  Discussed the use of AI scribe software for clinical note transcription with the patient, who gave verbal consent to proceed.   Melissa [name] 58 y.o. 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/Initial Ca 1997, Brain Mets 2019 06/10/2019   Mediastinal adenopathy    H/O coccidioidomycosis and Aspergillosus 05/16/2018   DOE (dyspnea on exertion) 05/03/2018   Diarrhea 04/22/2018   Cavitary lesion of lung in area of previous cyts in apex of Sup Segment of LLL 03/29/2018   Aortic atherosclerosis (HCC) 01/22/2018   Emphysema lung (HCC) 01/22/2018   Metastasis to brain (HCC) 08/04/2017   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 (HCC) 07/27/2017   Nicotine abuse 07/27/2017   Migraines 07/27/2017   Upper airway cough syndrome 08/10/2016   Abnormal echocardiogram 08/07/2013   Chest tightness or pressure 08/07/2013   Hx of radiation therapy    Edema of left lower extremity 11/19/2012   Tachycardia 09/20/2012   Obstructive bronchiectasis  (HCC) 01/26/2011   Cigarette smoker 01/26/2011    is allergic to aspirin, protonix [pantoprazole], doxycycline, promethazine-codeine, sulfamethoxazole-trimethoprim, and iodinated contrast media.  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)    Complication of anesthesia    bp dropped + desat    COPD (chronic obstructive pulmonary disease) (HCC)    Dyspnea    DOE   GERD (gastroesophageal reflux disease)    H/O coccidioidomycosis    was reason for lung lobectomy   Headache(784.0)    due to eye strain or not eating   Hemoptysis 05/08/2023   History of anemia    no current problem   History of asthma    as a child   History of breast cancer 2014   left   History of chemotherapy  finished 07/17/2013   History of hiatal hernia    AGE 14   Hx of radiation therapy 03/25/13-05/06/13   left breast 5000 cGy/25 sessions, left breast boost 1000 cGy/5 sessions   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   Personal history of chemotherapy    for liver cancer   Personal history of radiation therapy    Pneumonia    LAST FLARE UP 01/2018   Rash 02/22/2021   Runny nose 07/30/2013   clear drainage   Smoker 05/24/2021   Wears dentures    upper    SURGICAL HISTORY: Past Surgical History:  Procedure Laterality Date   APPLICATION OF CRANIAL NAVIGATION N/A 08/04/2017   Procedure: APPLICATION OF CRANIAL NAVIGATION;  Surgeon: [name], Loura Halt, MD;  Location: [facility] OR;  Service: Neurosurgery;  Laterality: N/A;   AXILLARY LYMPH NODE DISSECTION Left 02/08/2013   Procedure: LEFT AXILLARY DISSECTION;  Surgeon: Mariella Saa, MD;  Location: [facility];   Service: General;  Laterality: Left;   BREAST CYST EXCISION Right 2006   BREAST LUMPECTOMY Left 2014   BREAST LUMPECTOMY WITH NEEDLE LOCALIZATION AND AXILLARY SENTINEL LYMPH NODE BX Left 12/31/2012   Procedure: NEEDLE LOCALIZATION LEFT BREAST LUMPECTOMY AND LEFT AXILLARY SENTENIAL LYMPH NODE BX;  Surgeon: Mariella Saa, MD;  Location: [facility];  Service: General;  Laterality: Left;   BRONCHIAL BRUSHINGS  03/17/2020   Procedure: BRONCHIAL BRUSHINGS;  Surgeon: Josephine Igo, DO;  Location: [facility];  Service: Pulmonary;;   BRONCHIAL WASHINGS  03/17/2020   Procedure: BRONCHIAL WASHINGS;  Surgeon: Josephine Igo, DO;  Location: [facility];  Service: Pulmonary;;   CESAREAN SECTION  1995/1996   CRANIOTOMY Right 08/04/2017   Procedure: Right Frontal craniotomy for resection of tumor with stereotactic navigation;  Surgeon: [name], Loura Halt, MD;  Location[facility];  Service: Neurosurgery;  Laterality: Right;  Right Frontal craniotomy for resection of tumor with stereotactic navigation   IR CV LINE INJECTION  02/19/2021   IR CV LINE INJECTION  11/12/2021   IR IMAGING GUIDED PORT INSERTION  05/31/2019   LUNG LOBECTOMY Left 05/1996   upper lobe - due to [facility] Fever   PORT-A-CATH REMOVAL Right 08/02/2013   Procedure: REMOVAL PORT-A-CATH;  Surgeon: Mariella Saa, MD;  Location: [facility];  Service: General;  Laterality: Right;   PORTACATH PLACEMENT  07/02/2012   Procedure: INSERTION PORT-A-CATH;  Surgeon: Mariella Saa, MD;  Location: [facility];  Service: General;  Laterality: N/A;  right   VIDEO BRONCHOSCOPY N/A 03/17/2020   Procedure: VIDEO BRONCHOSCOPY;  Surgeon: Josephine Igo, DO;  Location: [facility];  Service: Pulmonary;  Laterality: N/A;   VIDEO BRONCHOSCOPY WITH ENDOBRONCHIAL ULTRASOUND N/A 05/23/2018   Procedure: VIDEO BRONCHOSCOPY WITH ENDOBRONCHIAL ULTRASOUND;  Surgeon: Josephine Igo, DO;  Location: [facility] OR;  Service:  Thoracic;  Laterality: N/A;    SOCIAL HISTORY: Social History   Socioeconomic History   Marital status: Married    Spouse name: Not on file   Number of children: 2   Years of education: Not on file   Highest education level: Not on file  Occupational History   Occupation: Investment banker, corporate  Tobacco Use   Smoking status: Every Day    Current packs/day: 0.30    Average packs/day: 0.3 packs/day for 38.0 years (11.4 ttl pk-yrs)    Types: Cigarettes   Smokeless tobacco: Never   Tobacco comments:    12 cigarettes a day-05/08/23  Vaping Use   Vaping status: Never  Used  Substance and Sexual Activity   Alcohol use: No   Drug use: No   Sexual activity: Not Currently  Other Topics Concern   Not on file  Social History Narrative   Not on file   Social Drivers of Health   Financial Resource Strain: Low Risk  (01/03/2023)   [facility], [facility]   Overall Financial Resource Strain (CARDIA)    Difficulty of Paying Living Expenses: Not hard at all  Food Insecurity: No Food Insecurity (01/30/2023)   Hunger Vital Sign    Worried About Running Out of Food in the Last Year: Never true    Ran Out of Food in the Last Year: Never true  Transportation Needs: No Transportation Needs (01/30/2023)   PRAPARE - Administrator, Civil Service (Medical): No    Lack of Transportation (Non-Medical): No  Physical Activity: Not on file  Stress: Not on file  Social Connections: Not on file  Intimate Partner Violence: Not At Risk (01/30/2023)   Humiliation, Afraid, Rape, and Kick questionnaire    Fear of Current or Ex-Partner: No    Emotionally Abused: No    Physically Abused: No    Sexually Abused: No    FAMILY HISTORY: Family History  Problem Relation Age of Onset   Emphysema Mother        was a smoker   Heart disease Mother    Melanoma Mother        dx in her 32s   Breast cancer Mother 50   Breast cancer Cousin 39 - 45        mother's maternal cousin   Asthma Brother     Review of Systems  Constitutional:  Negative for appetite change, chills, fatigue, fever and unexpected weight change.  HENT:   Negative for hearing loss, lump/mass and trouble swallowing.   Eyes:  Negative for eye problems and icterus.  Respiratory:  Negative for chest tightness, cough and shortness of breath.   Cardiovascular:  Negative for chest pain, leg swelling and palpitations.  Gastrointestinal:  Negative for abdominal distention, abdominal pain, constipation, diarrhea, nausea and vomiting.  Endocrine: Negative for hot flashes.  Genitourinary:  Negative for difficulty urinating.   Musculoskeletal:  Negative for arthralgias.  Skin:  Negative for itching and rash.  Neurological:  Negative for dizziness, extremity weakness, headaches and numbness.  Hematological:  Negative for adenopathy. Does not bruise/bleed easily.  Psychiatric/Behavioral:  Negative for depression. The patient is not nervous/anxious.       PHYSICAL EXAMINATION    Vitals:   09/19/23 1102  BP: 111/68  Pulse: 64  Resp: 16  Temp: 98.4 F (36.9 C)  SpO2: 98%    Physical Exam Constitutional:      General: She is not in acute distress.    Appearance: Normal appearance. She is not toxic-appearing.  HENT:     Head: Normocephalic and atraumatic.     Mouth/Throat:     Mouth: Mucous membranes are moist.     Pharynx: Oropharynx is clear. No oropharyngeal exudate or posterior oropharyngeal erythema.  Eyes:     General: No scleral icterus. Cardiovascular:     Rate and Rhythm: Normal rate and regular rhythm.     Pulses: Normal pulses.     Heart sounds: Normal heart sounds.  Pulmonary:     Effort: Pulmonary effort is normal.     Breath sounds: Normal breath sounds.  Abdominal:     General: Abdomen is  flat. Bowel sounds are normal. There is no distension.     Palpations: Abdomen is soft.     Tenderness: There is no abdominal tenderness.  Musculoskeletal:         General: No swelling.     Cervical back: Neck supple.  Lymphadenopathy:     Cervical: No cervical adenopathy.  Skin:    General: Skin is warm and dry.     Findings: No rash.  Neurological:     General: No focal deficit present.     Mental Status: She is alert.  Psychiatric:        Mood and Affect: Mood normal.        Behavior: Behavior normal.     LABORATORY DATA:  CBC    Component Value Date/Time   WBC 10.5 09/19/2023 0934   WBC 11.4 (H) 03/28/2023 1051   RBC 3.81 (L) 09/19/2023 0934   HGB 10.3 (L) 09/19/2023 0934   HGB 13.1 08/12/2014 0955   HCT 31.9 (L) 09/19/2023 0934   HCT 40.4 08/12/2014 0955   PLT 441 (H) 09/19/2023 0934   PLT 228 08/12/2014 0955   MCV 83.7 09/19/2023 0934   MCV 98.2 08/12/2014 0955   MCH 27.0 09/19/2023 0934   MCHC 32.3 09/19/2023 0934   RDW 17.0 (H) 09/19/2023 0934   RDW 13.0 08/12/2014 0955   LYMPHSABS 2.0 09/19/2023 0934   LYMPHSABS 2.0 08/12/2014 0955   MONOABS 0.8 09/19/2023 0934   MONOABS 0.5 08/12/2014 0955   EOSABS 1.1 (H) 09/19/2023 0934   EOSABS 0.2 08/12/2014 0955   BASOSABS 0.1 09/19/2023 0934   BASOSABS 0.0 08/12/2014 0955    CMP     Component Value Date/Time   NA 136 03/28/2023 1051   NA 142 08/12/2014 0956   K 4.7 03/28/2023 1051   K 4.6 08/12/2014 0956   CL 101 03/28/2023 1051   CL 100 12/20/2012 1509   CO2 24 03/28/2023 1051   CO2 26 08/12/2014 0956   GLUCOSE 84 03/28/2023 1051   GLUCOSE 94 08/12/2014 0956   GLUCOSE 98 12/20/2012 1509   BUN 9 03/28/2023 1051   BUN 10.1 08/12/2014 0956   CREATININE 0.83 03/28/2023 1051   CREATININE 0.9 08/12/2014 0956   CALCIUM 9.3 03/28/2023 1051   CALCIUM 9.3 08/12/2014 0956   PROT 6.3 03/28/2023 1051   PROT 7.1 08/12/2014 0956   ALBUMIN 3.6 11/29/2022 0939   ALBUMIN 3.8 08/12/2014 0956   AST 13 03/28/2023 1051   AST 13 (L) 11/29/2022 0939   AST 15 08/12/2014 0956   ALT 13 03/28/2023 1051   ALT 14 11/29/2022 0939   ALT 11 08/12/2014 0956   ALKPHOS 62 11/29/2022  0939   ALKPHOS 68 08/12/2014 0956   BILITOT 0.2 03/28/2023 1051   BILITOT 0.2 (L) 11/29/2022 0939   BILITOT 0.25 08/12/2014 0956   GFRNONAA >60 11/29/2022 0939   GFRNONAA 76 03/11/2019 1057   GFRAA >60 03/24/2020 1012   GFRAA >60 02/11/2020 0836   GFRAA 88 03/11/2019 1057       PENDING LABS:   RADIOGRAPHIC STUDIES:  No results found.   PATHOLOGY:     ASSESSMENT and THERAPY PLAN:   No problem-specific Assessment & Plan notes found for this encounter.   No orders of the defined types were placed in this encounter.   All questions were answered. The patient knows to call the clinic with any problems, questions or concerns. We can certainly see the patient much sooner if necessary. This note was electronically  signed. Noreene Filbert, NP 09/19/2023

## 2023-09-19 NOTE — Progress Notes (Signed)
 [facility] at [facility] [address]  [place], [place] [address] [phone]   Interval Evaluation  Date of Service: 09/19/23 Patient Name: Melissa [name] Patient MRN: [id number] Patient DOB: 05-16-1966 Provider: Henreitta Leber, Melissa [name]  Identifying Statement:  Melissa [name] is a 58 y.o. 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 Ki-67 was 70% HER-2 was amplified with a ratio 2.91   07/12/2012 - 07/17/2013 Neo-Adjuvant Chemotherapy   TCH 6 followed by Herceptin maintenance   08/14/2012 Genetic Testing   Negative genetic testing on the [employer].  The report date is August 14, 2012.  The [facility] gene panel offered by [facility] includes sequencing and deletion/duplication testing of the following 25 genes: APC, ATM, , BARD1, BMPR1A, BRCA1, BRCA2, BRIP1, CHD1, CDK4, CDKN2A, CHEK2, EPCAM (large rearrangement only), MLH1, MSH2, MSH6, MUTYH, NBN, PALB2, PMS2, PTEN, RAD51C, RAD51D, SMAD4, STK11, and TP53.     12/11/2012 Surgery   Left breast lumpectomy: 1.8 cm tumor 1 positive sentinel node, axillary lymph node dissection 02/08/2013 showed 0/13 lymph nodes   03/25/2013 - 05/06/2013 Radiation Therapy   Adjuvant radiation therapy   06/05/2013 - 07/20/2017 Anti-estrogen oral therapy   Tamoxifen 20 mg daily   07/27/2017 Relapse/Recurrence   MRI Brain: 3.4 x 2.9 x 2.9 cm RIGHT frontal lobe mass with imaging characteristics of solitary metastasis. Extensive vasogenic edema resulting in 9 mm RIGHT to LEFT midline shift. Equivocal very early LEFT ventricle entrapment.    08/04/2017 Surgery   Rt frontal brain resection:  Poorly differentiated tumor IHC suggests breast primary ER and PR Positive   08/25/2017 - 09/04/2017 Radiation Therapy   Stereotactic radiation   09/18/2017 -  Anti-estrogen oral therapy   Lapatinib with letrozole   12/18/2017 - 12/19/2017 Radiation Therapy   New right parietal lobe metastases status post [facility]   08/27/2018 - 08/27/2020 Radiation Therapy   SRS to new brain metastases   05/09/2019 Relapse/Recurrence   Interval increase in size of the enhancing nodular left internal mammary soft tissue 2.8 cm.  Redemonstrated enlarged supraclavicular, lower cervical and lower posterior cervical nodes unchanged.  Interval increase in the bony erosion of the posterior and lateral left third rib, increasing soft tissue lesion eroding the left sternal body 3.1 cm was 2.5 cm.  Bronchiectatic changes   06/19/2019 - 05/05/2020 Chemotherapy   ado-trastuzumab emtansine (KADCYLA)     06/15/2020 -  Chemotherapy   Xeloda, Tucatinib, Herceptin    04/12/2022 - 09/06/2022 Chemotherapy   Patient is on Treatment Plan : BREAST Trastuzumab IV (8/6) or SQ (600) D1 q21d     09/27/2022 -  Chemotherapy   Patient is on Treatment Plan : BREAST MAINTENANCE Trastuzumab IV (6) or SQ (600) D1 q21d x 13 cycles. Added bevacizumab (10) q21d starting cycle 8.     11/08/2022 Miscellaneous   Adding low-dose neratinib (80 mg) to Herceptin   12/20/2022 Cancer Staging   Staging form: Breast, AJCC 7th Edition - Pathologic: Stage IV (M1) - Signed by Melissa Socks, Melissa [name] on 12/20/2022   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/Initial Ca 1997, Brain Mets 2019  06/10/2019 Initial Diagnosis   Cancer of left  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 (HCC)   06/19/2019 - 05/05/2020 Chemotherapy   ado-trastuzumab emtansine (KADCYLA)     09/27/2022 -  Chemotherapy   Patient is on Treatment Plan : BREAST MAINTENANCE Trastuzumab IV (6) or SQ (600) D1 q21d x 13 cycles. Added bevacizumab (10) q21d starting cycle 8.     Port-A-Cath in place    CNS Oncologic History 02/20/23: SRS to pons recurrence, R temporal rsxn cavity.  Concurrent avastin initiated q3 weeks 12/29/22: Craniotomy, resection R temporal ([facility]).  Path is 70% neoplasm 11/04/22: Post-op frx SRS L occipital Melissa [name]) 10/06/22: Craniotomy, resection L occipital at [facility]), path is neoplasm 10/12/21: SRS to pons 16 Gy Melissa [name]) 06/29/21: SRS to 2x L occipital 03/17/21: SRS x3 08/27/20: SRS x5 05/13/20: SRS x4  03/25/19: SRS right frontal 5 mm 09/04/18: SRS x2 right frontal 12/18/17: SRS R parietal 09/04/17: Post op SRS R frontal, 5 fxs 08/04/17: Craniotomy, resection R frontal met (Melissa [name])  Interval History: Melissa [name] presents today for follow up after recent MRI brain.  No new or progressive neurologic complaints..  She feels well today, still has right sided visual impairment, left sided facial numbness.  In recent months had had worsening mood swings, irritability.    Prior: Melissa [name] KitchenMarland KitchenNow having completed SRS for recurrent brainstem metastasis and right temporal resection cavity.   She describes several weeks history of "coughing up blood, including large clots".  She has been evaluated by pulmonology, etiology is unclear.  Does report an increase in fatigue and lethargy since symptom onset.  Ohterwise no new or progressive neurologic complaints; she continues to experience numbness and tingling in her left hand since surgery in June.  Walking independently, no seizures or headaches.  Prior: She describes some lingering tingling of fingertips in her left hand, but this has actually improved in recent days.  There are still head pain sensations following surgery, described as "lightning bolt" feeling which lasts for 3-5 seconds, occurs 2-3x per hour.  This has not improved with Tylenol or other pain medications.  Otherwise no new or progressive neurologic symptoms, remains functionally intact and independent with gait.  Continues on Kan[place] with Dr. Pamelia Hoit,  tolerating it well.  Planning upcoming trip to beach with new camper.   She describes recurrence of daily headaches since surgery.  Has been dosing Tylenol or Aleve daily.  Sleep has been poor at times.  Steroids had been discontinued.  Her right sided visual symptoms have not recurred.  Otherwise no new or progressive changes.  H+P (08/02/22) Patient presents today for headache evaluation.  She describes pain "along the temples" mild to moderate, every day for the past month.  Prior to this she had very sporadic migraine type headaches, which these differ from.  No associated neurologic deficits.  She does dose codeine based cough syrup every night (for the past year), and has chronic sleep impairment.  Decadron 2mg  twice per day, started last week, has not helped any symptoms.  Also describes some "shadowy visual loss" on the right side, at times.  Continues on herceptin with Dr. Pamelia Hoit.  Medications: Current Outpatient Medications on File Prior to Visit  Medication Sig Dispense Refill   acetaminophen (TYLENOL) 325 MG tablet Take 2 tablets (650 mg total) by mouth every 6 (six) hours as needed for mild pain (or Fever >/= 101). 12 tablet 0   albuterol (VENTOLIN HFA) 108 (90 Base) MCG/ACT inhaler Inhale 2 puffs into the lungs every 6 (six) hours as needed for wheezing or shortness of breath (  cough). 18 g 5   calcium carbonate (TUMS EX) 750 MG chewable tablet Chew by mouth.     chlorpheniramine-HYDROcodone (TUSSIONEX) 10-8 MG/5ML Take 5 mLs by mouth 2 (two) times daily. 115 mL 0   cycloSPORINE (RESTASIS) 0.05 % ophthalmic emulsion Place 1 drop into the right eye.     escitalopram (LEXAPRO) 10 MG tablet Take 10 mg by mouth daily.     GARLIC PO Take by mouth.     levalbuterol (XOPENEX) 0.63 MG/3ML nebulizer solution Take 3 mLs (0.63 mg total) by nebulization every 4 (four) hours as needed for wheezing or shortness of breath. 90 mL 12   lidocaine-prilocaine (EMLA) cream APPLY EXTERNALLY TO THE AFFECTED AREA  1 TIME 30 g 3   Multiple Vitamins-Minerals (EMERGEN-C VITAMIN C PO) Take by mouth.     naproxen sodium (ALEVE) 220 MG tablet Take by mouth.     OIL OF OREGANO PO Take 500 mg by mouth 2 (two) times daily.     ondansetron (ZOFRAN-ODT) 4 MG disintegrating tablet Take 4 mg by mouth every 8 (eight) hours as needed.     predniSONE (DELTASONE) 50 MG tablet Take 1 tablet 13 hrs prior to scan, 2nd tablet 7 hrs prior, 3rd tablet 1 hr prior 3 tablet 1   SYMBICORT 160-4.5 MCG/ACT inhaler Inhale 2 puffs into the lungs 2 (two) times daily.     triamcinolone ointment (KENALOG) 0.5 % Apply 1 Application topically 2 (two) times daily. 30 g 0   Vitamin E (VITAMIN E/D-ALPHA NATURAL) 268 MG (400 UNIT) CAPS Take by mouth.     Current Facility-Administered Medications on File Prior to Visit  Medication Dose Route Frequency Provider Last Rate Last Admin   alteplase (CATHFLO ACTIVASE) injection 2 mg  2 mg Intracatheter Once PRN Melissa Croissant, Melissa [name]       sodium chloride flush (NS) 0.9 % injection 10 mL  10 mL Intracatheter PRN Melissa Croissant, Melissa [name]   10 mL at 07/06/21 1428    Allergies:  Allergies  Allergen Reactions   Aspirin Anaphylaxis and Shortness Of Breath    THROAT CLOSES   Protonix [Pantoprazole] Nausea Only and Other (See Comments)    Also caused a "film in the mouth" and caused chest pressure   Doxycycline Other (See Comments)    Hallucinations, headaches   Promethazine-Codeine Cough    Worsening cough and insomnia    Sulfamethoxazole-Trimethoprim Other (See Comments)   Iodinated Contrast Media Rash    Patient allergic to contrast used in radiation oncology for CT simulation    Past Medical History:  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)    2019   Cancer,  metastatic to liver (HCC)    2021   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)    Complication of anesthesia    bp dropped + desat    COPD (chronic obstructive pulmonary disease) (HCC)    Dyspnea    DOE   GERD (gastroesophageal reflux disease)    H/O coccidioidomycosis    was reason for lung lobectomy   Headache(784.0)    due to eye strain or not eating   Hemoptysis 05/08/2023   History of anemia  no current problem   History of asthma    as a child   History of breast cancer 2014   left   History of chemotherapy    finished 07/17/2013   History of hiatal hernia    AGE 56   Hx of radiation therapy 03/25/13-05/06/13   left breast 5000 cGy/25 sessions, left breast boost 1000 cGy/5 sessions   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   Personal history of chemotherapy    for liver cancer   Personal history of radiation therapy    Pneumonia    LAST FLARE UP 01/2018   Rash 02/22/2021   Runny nose 07/30/2013   clear drainage   Smoker 05/24/2021   Wears dentures    upper   Past Surgical History:  Past Surgical History:  Procedure Laterality Date   APPLICATION OF CRANIAL NAVIGATION N/A 08/04/2017   Procedure: APPLICATION OF CRANIAL NAVIGATION;  Surgeon: Melissa [name], Melissa Halt, Melissa [name];  Location: [facility];  Service: Neurosurgery;  Laterality: N/A;   AXILLARY LYMPH NODE DISSECTION Left 02/08/2013   Procedure: LEFT AXILLARY DISSECTION;  Surgeon: Mariella Saa, Melissa [name];  Location: [facility];  Service: General;  Laterality: Left;   BREAST CYST EXCISION Right 2006   BREAST LUMPECTOMY Left 2014   BREAST LUMPECTOMY WITH NEEDLE LOCALIZATION AND AXILLARY SENTINEL LYMPH NODE BX Left 12/31/2012   Procedure: NEEDLE LOCALIZATION LEFT BREAST LUMPECTOMY AND LEFT AXILLARY SENTENIAL LYMPH NODE BX;  Surgeon: Mariella Saa, Melissa [name];  Location: [facility];  Service: General;  Laterality: Left;   BRONCHIAL BRUSHINGS  03/17/2020   Procedure: BRONCHIAL BRUSHINGS;  Surgeon: Melissa Igo, Melissa [name];  Location:  [facility];  Service: Pulmonary;;   BRONCHIAL WASHINGS  03/17/2020   Procedure: BRONCHIAL WASHINGS;  Surgeon: Melissa Igo, Melissa [name];  Location: [facility];  Service: Pulmonary;;   CESAREAN SECTION  1995/1996   CRANIOTOMY Right 08/04/2017   Procedure: Right Frontal craniotomy for resection of tumor with stereotactic navigation;  Surgeon: Melissa [name], Melissa Halt, Melissa [name];  Location: [facility] OR;  Service: Neurosurgery;  Laterality: Right;  Right Frontal craniotomy for resection of tumor with stereotactic navigation   IR CV LINE INJECTION  02/19/2021   IR CV LINE INJECTION  11/12/2021   IR IMAGING GUIDED PORT INSERTION  05/31/2019   LUNG LOBECTOMY Left 05/1996   upper lobe - due to [facility] Fever   PORT-A-CATH REMOVAL Right 08/02/2013   Procedure: REMOVAL PORT-A-CATH;  Surgeon: Mariella Saa, Melissa [name];  Location: [facility];  Service: General;  Laterality: Right;   PORTACATH PLACEMENT  07/02/2012   Procedure: INSERTION PORT-A-CATH;  Surgeon: Mariella Saa, Melissa [name];  Location:  SURGERY CENTER;  Service: General;  Laterality: N/A;  right   VIDEO BRONCHOSCOPY N/A 03/17/2020   Procedure: VIDEO BRONCHOSCOPY;  Surgeon: Melissa Igo, Melissa [name];  Location: [facility];  Service: Pulmonary;  Laterality: N/A;   VIDEO BRONCHOSCOPY WITH ENDOBRONCHIAL ULTRASOUND N/A 05/23/2018   Procedure: VIDEO BRONCHOSCOPY WITH ENDOBRONCHIAL ULTRASOUND;  Surgeon: Melissa Igo, Melissa [name];  Location: [facility] OR;  Service: Thoracic;  Laterality: N/A;   Social History:  Social History   Socioeconomic History   Marital status: Married    Spouse name: Not on file   Number of children: 2   Years of education: Not on file   Highest education level: Not on file  Occupational History   Occupation: Investment banker, corporate  Tobacco Use   Smoking status: Every Day    Current packs/day: 0.30    Average packs/day: 0.3 packs/day for 38.0 years (  11.4 ttl pk-yrs)    Types: Cigarettes   Smokeless tobacco: Never   Tobacco comments:    12  cigarettes a day-05/08/23  Vaping Use   Vaping status: Never Used  Substance and Sexual Activity   Alcohol use: No   Drug use: No   Sexual activity: Not Currently  Other Topics Concern   Not on file  Social History Narrative   Not on file   Social Drivers of Health   Financial Resource Strain: Low Risk  (01/03/2023)   Received from [facility], [facility]   Overall Financial Resource Strain (CARDIA)    Difficulty of Paying Living Expenses: Not hard at all  Food Insecurity: No Food Insecurity (01/30/2023)   Hunger Vital Sign    Worried About [name] Out of Food in the Last Year: Never true    Ran Out of Food in the Last Year: Never true  Transportation Needs: No Transportation Needs (01/30/2023)   PRAPARE - Administrator, Civil Service (Medical): No    Lack of Transportation (Non-Medical): No  Physical Activity: Not on file  Stress: Not on file  Social Connections: Not on file  Intimate Partner Violence: Not At Risk (01/30/2023)   Humiliation, Afraid, Rape, and Kick questionnaire    Fear of Current or Ex-Partner: No    Emotionally Abused: No    Physically Abused: No    Sexually Abused: No   Family History:  Family History  Problem Relation Age of Onset   Emphysema Mother        was a smoker   Heart disease Mother    Melanoma Mother        dx in her 45s   Breast cancer Mother 6   Breast cancer Cousin 42 - 72       mother's maternal cousin   Asthma Brother     Review of Systems: Constitutional: Doesn't report fevers, chills or abnormal weight loss Eyes: Doesn't report blurriness of vision Ears, nose, mouth, throat, and face: Doesn't report sore throat Respiratory: Doesn't report cough, dyspnea or wheezes Cardiovascular: Doesn't report palpitation, chest discomfort  Gastrointestinal:  Doesn't report nausea, constipation, diarrhea GU: Doesn't report incontinence Skin: Doesn't report skin rashes Neurological: Per  HPI Musculoskeletal: Doesn't report joint pain Behavioral/Psych: Doesn't report anxiety  Physical Exam: There were no vitals filed for this visit.   KPS: 80. General: Alert, cooperative, pleasant, in no acute distress Head: Normal EENT: No conjunctival injection or scleral icterus.  Lungs: Resp effort normal Cardiac: Regular rate Abdomen: Non-distended abdomen Skin: No rashes cyanosis or petechiae. Extremities: No clubbing or edema  Neurologic Exam: Mental Status: Awake, alert, attentive to examiner. Oriented to self and environment. Language is fluent with intact comprehension.  Cranial Nerves: Visual acuity is grossly normal. Visual fields are full. Extra-ocular movements intact. No ptosis. Face is symmetric Motor: Tone and bulk are normal. Power is full in both arms and legs. Reflexes are symmetric, no pathologic reflexes present.  Sensory: Intact to light touch Gait: Normal.   Labs: I have reviewed the data as listed    Component Value Date/Time   NA 136 03/28/2023 1051   NA 142 08/12/2014 0956   K 4.7 03/28/2023 1051   K 4.6 08/12/2014 0956   CL 101 03/28/2023 1051   CL 100 12/20/2012 1509   CO2 24 03/28/2023 1051   CO2 26 08/12/2014 0956   GLUCOSE 84 03/28/2023 1051   GLUCOSE 94 08/12/2014 0956   GLUCOSE  98 12/20/2012 1509   BUN 9 03/28/2023 1051   BUN 10.1 08/12/2014 0956   CREATININE 0.83 03/28/2023 1051   CREATININE 0.9 08/12/2014 0956   CALCIUM 9.3 03/28/2023 1051   CALCIUM 9.3 08/12/2014 0956   PROT 6.3 03/28/2023 1051   PROT 7.1 08/12/2014 0956   ALBUMIN 3.6 11/29/2022 0939   ALBUMIN 3.8 08/12/2014 0956   AST 13 03/28/2023 1051   AST 13 (L) 11/29/2022 0939   AST 15 08/12/2014 0956   ALT 13 03/28/2023 1051   ALT 14 11/29/2022 0939   ALT 11 08/12/2014 0956   ALKPHOS 62 11/29/2022 0939   ALKPHOS 68 08/12/2014 0956   BILITOT 0.2 03/28/2023 1051   BILITOT 0.2 (L) 11/29/2022 0939   BILITOT 0.25 08/12/2014 0956   GFRNONAA >60 11/29/2022 0939    GFRNONAA 76 03/11/2019 1057   GFRAA >60 03/24/2020 1012   GFRAA >60 02/11/2020 0836   GFRAA 88 03/11/2019 1057   Lab Results  Component Value Date   WBC 10.5 09/19/2023   NEUTROABS 6.5 09/19/2023   HGB 10.3 (L) 09/19/2023   HCT 31.9 (L) 09/19/2023   MCV 83.7 09/19/2023   PLT 441 (H) 09/19/2023    Imaging:  MR BRAIN W WO CONTRAST Result Date: 09/14/2023 CLINICAL DATA:  Brain/CNS neoplasm, assess treatment response. EXAM: MRI HEAD WITHOUT AND WITH CONTRAST TECHNIQUE: Multiplanar, multiecho pulse sequences of the brain and surrounding structures were obtained without and with intravenous contrast. CONTRAST:  5 cc Vueway COMPARISON:  06/15/2023.  04/13/2023.  02/01/2023. FINDINGS: Brain: Diffusion imaging does not show any acute or subacute infarction or other cause of restricted diffusion. Stable size and enhancement pattern of the central pontine treated lesion. Slightly increased T2 signal in the pons likely subsequent to prior treatment. Stable enhancement at the superior cerebellar vermis. Stable pattern of volume loss and T2 FLAIR signal within the right anterior temporal region. Largely stable pattern of dural enhancement. One could question minimally prominent increase in enhancement at the superomedial dural surface just medial to the cavernous sinus. Increasing punctate focus of enhancement in the medial left temporal lobe series 14 axial image 76, maximal diameter 4.5 mm. Stable or minimally more prominent punctate focus of enhancement in the right occipital lobe axial image 100. Stable post treatment volume loss and gliosis with minimal enhancement in the left occipital and posterior parietal region. Stable post treatment findings in the right frontal region with volume loss, gliosis and patchy enhancement. Progressive enhancement at the medial right posterior frontal vertex, particularly along the posterior margin. This could be treatment effect or disease progression. No hydrocephalus. No  extra-axial fluid collection. T2 and FLAIR brain signal in the scattered areas of involvement appears similar. Vascular: Major vessels at the base of the brain show flow. Skull and upper cervical spine: Negative Sinuses/Orbits: Clear/normal Other: Right mastoid effusion. IMPRESSION: 1. Stable size and enhancement pattern of the central pontine treated lesion. Slightly increased T2 signal in the pons likely subsequent to prior treatment. 2. Increasing punctate focus of enhancement in the medial left temporal lobe, maximal diameter 4.5 mm. Consistent with new disease. 3. Stable or minimally more prominent punctate focus of enhancement in the right occipital lobe. 4. Stable appearance at the cerebellar vermis. 5. Stable post treatment findings in the right frontal region with volume loss, gliosis and patchy enhancement. 6. Progressive enhancement at the medial right posterior frontal vertex, particularly along the posterior margin. This could be treatment effect or disease progression. 7. Largely stable pattern of dural enhancement in  the right anterior temporal region/middle cranial fossa. One could question minimal increase in enhancement at the superomedial dural surface just medial to the cavernous sinus. Electronically Signed   By: Melissa [name] M.D.   On: 09/14/2023 15:56    [facility] Clinician Interpretation: I have personally reviewed the radiological images as listed.  My interpretation, in the context of the patient's clinical presentation, is stable disease   Assessment/Plan Metastasis to brain [facility])  Ivory Broad Andreoli is clinically stable today from focal neurologic standpiont.  MRI brain demonstrates overall stability of treated pontine lesion.  Two new foci of enhancement are within are immediately adjacent to site of prior radiosurgery.  We will continue to monitor these carefully with imaging surveillance.  Can utilize corticosteroids if focal deficits develop from post-RT inflammation given hemoptysis  from avastin.  We appreciate the opportunity to participate in the care of Melissa [name].   We ask that LYNNIE KOEHLER return to clinic in 3 months following next MRI brain, or sooner if needed.  All questions were answered. The patient knows to call the clinic with any problems, questions or concerns. No barriers to learning were detected.  The total time spent in the encounter was 40 minutes and more than 50% was on counseling and review of test results   Melissa Leber, Melissa [name] Medical Director of Neuro-Oncology [facility] at [facility] 09/19/23 10:05 AM

## 2023-09-19 NOTE — Patient Instructions (Signed)
 [facility] - A DEPT OF [facility]  Discharge Instructions: Thank you for choosing [facility] to provide your oncology and hematology care.   If you have a lab appointment with the Cancer Center, please go directly to the Cancer Center and check in at the registration area.   Wear comfortable clothing and clothing appropriate for easy access to any Portacath or PICC line.   We strive to give you quality time with your provider. You may need to reschedule your appointment if you arrive late (15 or more minutes).  Arriving late affects you and other patients whose appointments are after yours.  Also, if you miss three or more appointments without notifying the office, you may be dismissed from the clinic at the provider's discretion.      For prescription refill requests, have your pharmacy contact our office and allow 72 hours for refills to be completed.    Today you received the following chemotherapy and/or immunotherapy agents: Kanjitni.       To help prevent nausea and vomiting after your treatment, we encourage you to take your nausea medication as directed.  BELOW ARE SYMPTOMS THAT SHOULD BE REPORTED IMMEDIATELY: *FEVER GREATER THAN 100.4 F (38 C) OR HIGHER *CHILLS OR SWEATING *NAUSEA AND VOMITING THAT IS NOT CONTROLLED WITH YOUR NAUSEA MEDICATION *UNUSUAL SHORTNESS OF BREATH *UNUSUAL BRUISING OR BLEEDING *URINARY PROBLEMS (pain or burning when urinating, or frequent urination) *BOWEL PROBLEMS (unusual diarrhea, constipation, pain near the anus) TENDERNESS IN MOUTH AND THROAT WITH OR WITHOUT PRESENCE OF ULCERS (sore throat, sores in mouth, or a toothache) UNUSUAL RASH, SWELLING OR PAIN  UNUSUAL VAGINAL DISCHARGE OR ITCHING   Items with * indicate a potential emergency and should be followed up as soon as possible or go to the Emergency Department if any problems should occur.  Please show the CHEMOTHERAPY ALERT CARD or IMMUNOTHERAPY  ALERT CARD at check-in to the Emergency Department and triage nurse.  Should you have questions after your visit or need to cancel or reschedule your appointment, please contact [facility] - A DEPT OF [facility]  Dept: [phone]  and follow the prompts.  Office hours are 8:00 a.m. to 4:30 p.m. Monday - Friday. Please note that voicemails left after 4:00 p.m. may not be returned until the following business day.  We are closed weekends and major holidays. You have access to a nurse at all times for urgent questions. Please call the main number to the clinic Dept: [phone] and follow the prompts.   For any non-urgent questions, you may also contact your provider using MyChart. We now offer e-Visits for anyone 64 and older to request care online for non-urgent symptoms. For details visit [email or web address].   Also download the MyChart app! Go to the app store, search "MyChart", open the app, select [place], and log in with your MyChart username and password.

## 2023-09-20 ENCOUNTER — Encounter: Payer: Self-pay | Admitting: Hematology and Oncology

## 2023-09-20 ENCOUNTER — Telehealth: Payer: Self-pay | Admitting: Internal Medicine

## 2023-09-20 NOTE — Telephone Encounter (Signed)
 Patient scheduled appointments. Patient is aware of all appointment details.

## 2023-09-20 NOTE — Telephone Encounter (Signed)
 Called patient to schedule treatment plan. Patient is acceptable to MyChart notifications when tx is scheduled. Patient stated she will call if any changes need to be made.

## 2023-09-21 ENCOUNTER — Inpatient Hospital Stay: Admit: 2023-09-21 | Admitting: Internal Medicine

## 2023-09-21 ENCOUNTER — Encounter: Payer: Self-pay | Admitting: Hematology and Oncology

## 2023-09-21 ENCOUNTER — Other Ambulatory Visit: Payer: Self-pay

## 2023-09-21 ENCOUNTER — Encounter (HOSPITAL_COMMUNITY): Payer: Self-pay

## 2023-09-21 DIAGNOSIS — Z5329 Procedure and treatment not carried out because of patient's decision for other reasons: Secondary | ICD-10-CM | POA: Diagnosis not present

## 2023-09-21 DIAGNOSIS — R42 Dizziness and giddiness: Secondary | ICD-10-CM | POA: Diagnosis not present

## 2023-09-21 DIAGNOSIS — R262 Difficulty in walking, not elsewhere classified: Secondary | ICD-10-CM | POA: Diagnosis not present

## 2023-09-21 DIAGNOSIS — R531 Weakness: Secondary | ICD-10-CM | POA: Diagnosis not present

## 2023-09-21 DIAGNOSIS — R569 Unspecified convulsions: Secondary | ICD-10-CM | POA: Diagnosis not present

## 2023-09-21 DIAGNOSIS — C7931 Secondary malignant neoplasm of brain: Secondary | ICD-10-CM | POA: Diagnosis not present

## 2023-09-21 DIAGNOSIS — Z79899 Other long term (current) drug therapy: Secondary | ICD-10-CM | POA: Diagnosis not present

## 2023-09-21 DIAGNOSIS — C50919 Malignant neoplasm of unspecified site of unspecified female breast: Secondary | ICD-10-CM | POA: Diagnosis not present

## 2023-09-21 DIAGNOSIS — R000 Tachycardia, unspecified: Secondary | ICD-10-CM | POA: Diagnosis not present

## 2023-09-21 DIAGNOSIS — R9389 Abnormal findings on diagnostic imaging of other specified body structures: Secondary | ICD-10-CM | POA: Diagnosis not present

## 2023-09-21 DIAGNOSIS — R079 Chest pain, unspecified: Secondary | ICD-10-CM | POA: Diagnosis not present

## 2023-09-21 DIAGNOSIS — J479 Bronchiectasis, uncomplicated: Secondary | ICD-10-CM | POA: Diagnosis not present

## 2023-09-21 DIAGNOSIS — Z171 Estrogen receptor negative status [ER-]: Secondary | ICD-10-CM | POA: Diagnosis not present

## 2023-09-21 DIAGNOSIS — G9389 Other specified disorders of brain: Secondary | ICD-10-CM | POA: Diagnosis not present

## 2023-09-21 NOTE — Assessment & Plan Note (Signed)
 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  SRS brain: 08/25/2017-09/04/2017 2. Anti Her 2 therapy with Lapatinib started 09/17/2017-05/14/2019: Stopped for progression 3.  I discontinued tamoxifen and started her on letrozole 2.5 mg daily.  05/14/2019 stopped for progression 4.  Stereotactic radiosurgery 12/19/2017 to the new right parietal lobe metastases. 5.  Kadcyla: Received 16 cycles discontinued 05/05/2020 6.  Tucatinib Xeloda: Discontinued because of hand-foot syndrome 06/11/2020-12/15/2020 7. Herceptin/Neratinib: unable to tolerate due to diarrhea and weight loss 8.  SRS to new brain mets 10/01/2021 9.  Resection of brain metastases at [facility] 12/29/2022: Metastatic breast cancer proc panel pending 10.  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 Trastuzumab well-tolerated, maintaining disease stability. - Continue trastuzumab every three weeks. - Continue f/u with Dr. Barbaraann Cao in neuro-oncology for brain metastases  Fungal infection Managed by Dr. Daiva Eves in Infectious Disease Clinic.  Posaconazole caused persistent cough. Discontinued due to side effects and insurance issues. Discussed possibility of biopsy to reassess infection. - Continue f/u with Dr. Daiva Eves for guidance and management.

## 2023-09-25 ENCOUNTER — Telehealth: Payer: Self-pay | Admitting: Internal Medicine

## 2023-09-25 ENCOUNTER — Telehealth: Payer: Self-pay | Admitting: *Deleted

## 2023-09-25 NOTE — Telephone Encounter (Signed)
-----   Message from Henreitta Leber sent at 09/25/2023 11:59 AM EDT ----- Nothing new on the CT... she just had an MRI which we reviewed in clinic.  Continue Keppra and we can set up phone visit to review seizure precautions, since I just saw them a few days ago ----- Message ----- From: Arville Care, RN Sent: 09/25/2023  10:31 AM EDT To: Henreitta Leber, MD  Tresa Endo called & said she went to the ED at [facility] last Thursday evening for seizure, a CT was done & she says the provider mentioned something about her temporal lobe which was new to her.  She was also prescribed Keppra.  She is asking what your opinion is concerning these findings.  I could not see her ED visit in Care Everywhere.

## 2023-09-25 NOTE — Telephone Encounter (Signed)
 Returned PC to patient, informed her of Dr Liana Gerold recommendation - continue Keppra as prescribed & he wants to schedule a phone visit.  Phone visit appointment scheduled for 09/28/23 at 9:30, patient verbalizes understanding.

## 2023-09-26 ENCOUNTER — Other Ambulatory Visit: Payer: Self-pay | Admitting: Radiation Therapy

## 2023-09-28 ENCOUNTER — Telehealth: Admitting: Internal Medicine

## 2023-09-28 ENCOUNTER — Inpatient Hospital Stay (HOSPITAL_BASED_OUTPATIENT_CLINIC_OR_DEPARTMENT_OTHER): Admitting: Internal Medicine

## 2023-09-28 DIAGNOSIS — C7931 Secondary malignant neoplasm of brain: Secondary | ICD-10-CM | POA: Diagnosis not present

## 2023-09-28 NOTE — Progress Notes (Signed)
 I connected with Melissa [name] on 09/28/23 at  3:45 PM EDT by telephone visit and verified that I am speaking with the correct person using two identifiers.  I discussed the limitations, risks, security and privacy concerns of performing an evaluation and management service by telemedicine and the availability of in-person appointments. I also discussed with the patient that there may be a patient responsible charge related to this service. The patient expressed understanding and agreed to proceed.   Other persons participating in the visit and their role in the encounter:  n/a  Patient's location:  Home Provider's location:  Office Chief Complaint:  Metastasis to brain [facility])  History of Present Ilness: Melissa [name] describes seizure event last week which led to ED visit at [facility]- shaking of both legs (cycling motion) and posturing of both arms in fixed position.  She was awake during the event which lasted several minutes.  Afterwards she was sleepy and weak, but improved back to baseline within hours.  Apparently there had been a similar event back in 2019.  No illness, stress or sleep deprivation otherwise.  She was discharged with Keppra 500mg  twice per day.  Observations: Language and cognition at baseline  Assessment and Plan: Metastasis to brain [facility])  Melissa [name] presents with unprovoked focal seizure; semiology localizes to right frontal lobe site of encephalomalacia.  No concern for additional tumor recurrence based on very recent scan, visit.  Recommended continuing Keppra 500mg  BID for now.  Counseled on epilepsy safety and [place] driving restrictions.     Follow Up Instructions: We ask that Melissa [name] return to clinic in 2 months following next brain MRI, or sooner as needed.  I discussed the assessment and treatment plan with the patient.  The patient was provided an opportunity to ask questions and all were answered.  The patient agreed with the plan and demonstrated  understanding of the instructions.    The patient was advised to call back or seek an in-person evaluation if the symptoms worsen or if the condition fails to improve as anticipated.    Henreitta Leber, MD   I provided 30 minutes of non face-to-face telephone visit time during this encounter, and > 50% was spent counseling as documented under my assessment & plan.

## 2023-10-03 ENCOUNTER — Telehealth: Payer: Self-pay | Admitting: Radiation Oncology

## 2023-10-03 ENCOUNTER — Encounter: Payer: Self-pay | Admitting: Internal Medicine

## 2023-10-03 NOTE — Telephone Encounter (Signed)
 Received medical record request from [facility]. Faxed Medical records and forwarded request to [employer].

## 2023-10-05 ENCOUNTER — Other Ambulatory Visit: Payer: Self-pay | Admitting: Hematology and Oncology

## 2023-10-09 MED ORDER — HYDROCOD POLI-CHLORPHE POLI ER 10-8 MG/5ML PO SUER: 5.0000 mL | Freq: Two times a day (BID) | ORAL | 0 refills | Status: DC

## 2023-10-10 ENCOUNTER — Other Ambulatory Visit: Payer: Self-pay

## 2023-10-10 ENCOUNTER — Inpatient Hospital Stay: Payer: Medicare HMO | Attending: Hematology and Oncology

## 2023-10-10 ENCOUNTER — Inpatient Hospital Stay: Payer: Medicare HMO

## 2023-10-10 VITALS — BP 101/54 | HR 74 | Temp 98.4°F | Resp 16 | Wt 107.2 lb

## 2023-10-10 DIAGNOSIS — Z170 Estrogen receptor positive status [ER+]: Secondary | ICD-10-CM | POA: Insufficient documentation

## 2023-10-10 DIAGNOSIS — C787 Secondary malignant neoplasm of liver and intrahepatic bile duct: Secondary | ICD-10-CM | POA: Insufficient documentation

## 2023-10-10 DIAGNOSIS — C7931 Secondary malignant neoplasm of brain: Secondary | ICD-10-CM | POA: Insufficient documentation

## 2023-10-10 DIAGNOSIS — Z5112 Encounter for antineoplastic immunotherapy: Secondary | ICD-10-CM | POA: Insufficient documentation

## 2023-10-10 DIAGNOSIS — Z95828 Presence of other vascular implants and grafts: Secondary | ICD-10-CM

## 2023-10-10 DIAGNOSIS — Z1721 Progesterone receptor positive status: Secondary | ICD-10-CM | POA: Diagnosis not present

## 2023-10-10 DIAGNOSIS — C50912 Malignant neoplasm of unspecified site of left female breast: Secondary | ICD-10-CM

## 2023-10-10 DIAGNOSIS — C50212 Malignant neoplasm of upper-inner quadrant of left female breast: Secondary | ICD-10-CM | POA: Diagnosis not present

## 2023-10-10 DIAGNOSIS — Z1732 Human epidermal growth factor receptor 2 negative status: Secondary | ICD-10-CM | POA: Diagnosis not present

## 2023-10-10 DIAGNOSIS — C7951 Secondary malignant neoplasm of bone: Secondary | ICD-10-CM | POA: Diagnosis not present

## 2023-10-10 LAB — CMP (CANCER CENTER ONLY)
ALT: 6 U/L (ref 0–44)
AST: 8 U/L — ABNORMAL LOW (ref 15–41)
Albumin: 3.5 g/dL (ref 3.5–5.0)
Alkaline Phosphatase: 88 U/L (ref 38–126)
Anion gap: 5 (ref 5–15)
BUN: 8 mg/dL (ref 6–20)
CO2: 28 mmol/L (ref 22–32)
Calcium: 9.6 mg/dL (ref 8.9–10.3)
Chloride: 103 mmol/L (ref 98–111)
Creatinine: 0.74 mg/dL (ref 0.44–1.00)
GFR, Estimated: >60 mL/min (ref >60)
Glucose, Bld: 114 mg/dL — ABNORMAL HIGH (ref 70–99)
Potassium: 4.3 mmol/L (ref 3.5–5.1)
Sodium: 136 mmol/L (ref 135–145)
Total Bilirubin: 0.2 mg/dL (ref 0.0–1.2)
Total Protein: 7.3 g/dL (ref 6.5–8.1)

## 2023-10-10 LAB — CBC WITH DIFFERENTIAL (CANCER CENTER ONLY)
Abs Immature Granulocytes: 0.02 10*3/uL (ref 0.00–0.07)
Basophils Absolute: 0.1 10*3/uL (ref 0.0–0.1)
Basophils Relative: 1 %
Eosinophils Absolute: 1.3 10*3/uL — ABNORMAL HIGH (ref 0.0–0.5)
Eosinophils Relative: 9 %
HCT: 28.3 % — ABNORMAL LOW (ref 36.0–46.0)
Hemoglobin: 9.1 g/dL — ABNORMAL LOW (ref 12.0–15.0)
Immature Granulocytes: 0 %
Lymphocytes Relative: 15 %
Lymphs Abs: 2 10*3/uL (ref 0.7–4.0)
MCH: 26.8 pg (ref 26.0–34.0)
MCHC: 32.2 g/dL (ref 30.0–36.0)
MCV: 83.2 fL (ref 80.0–100.0)
Monocytes Absolute: 0.7 10*3/uL (ref 0.1–1.0)
Monocytes Relative: 5 %
Neutro Abs: 9.8 10*3/uL — ABNORMAL HIGH (ref 1.7–7.7)
Neutrophils Relative %: 70 %
Platelet Count: 516 10*3/uL — ABNORMAL HIGH (ref 150–400)
RBC: 3.4 MIL/uL — ABNORMAL LOW (ref 3.87–5.11)
RDW: 16.9 % — ABNORMAL HIGH (ref 11.5–15.5)
WBC Count: 14 10*3/uL — ABNORMAL HIGH (ref 4.0–10.5)
nRBC: 0 % (ref 0.0–0.2)

## 2023-10-10 MED ORDER — HEPARIN SOD (PORK) LOCK FLUSH 100 UNIT/ML IV SOLN
500.0000 [IU] | Freq: Once | INTRAVENOUS | Status: AC | PRN
Start: 2023-10-10 — End: 2023-10-10
  Administered 2023-10-10: 500 [IU]

## 2023-10-10 MED ORDER — SODIUM CHLORIDE 0.9% FLUSH
10.0000 mL | INTRAVENOUS | Status: DC | PRN
  Administered 2023-10-10: 10 mL

## 2023-10-10 MED ORDER — DIPHENHYDRAMINE HCL 25 MG PO CAPS
25.0000 mg | ORAL_CAPSULE | Freq: Once | ORAL | Status: AC
  Administered 2023-10-10: 25 mg via ORAL
  Filled 2023-10-10: qty 1

## 2023-10-10 MED ORDER — SODIUM CHLORIDE 0.9 % IV SOLN
Freq: Once | INTRAVENOUS | Status: AC
Start: 2023-10-10 — End: 2023-10-10

## 2023-10-10 MED ORDER — ACETAMINOPHEN 325 MG PO TABS
650.0000 mg | ORAL_TABLET | Freq: Once | ORAL | Status: AC
  Administered 2023-10-10: 650 mg via ORAL
  Filled 2023-10-10: qty 2

## 2023-10-10 MED ORDER — TRASTUZUMAB-ANNS CHEMO 150 MG IV SOLR
6.0000 mg/kg | Freq: Once | INTRAVENOUS | Status: AC
  Administered 2023-10-10: 300 mg via INTRAVENOUS
  Filled 2023-10-10: qty 14.29

## 2023-10-10 NOTE — Patient Instructions (Signed)
 [facility] - A DEPT OF [facility]  Discharge Instructions: Thank you for choosing [facility] to provide your oncology and hematology care.   If you have a lab appointment with the Cancer Center, please go directly to the Cancer Center and check in at the registration area.   Wear comfortable clothing and clothing appropriate for easy access to any Portacath or PICC line.   We strive to give you quality time with your provider. You may need to reschedule your appointment if you arrive late (15 or more minutes).  Arriving late affects you and other patients whose appointments are after yours.  Also, if you miss three or more appointments without notifying the office, you may be dismissed from the clinic at the provider's discretion.      For prescription refill requests, have your pharmacy contact our office and allow 72 hours for refills to be completed.    Today you received the following chemotherapy and/or immunotherapy agents: Kanjinti      To help prevent nausea and vomiting after your treatment, we encourage you to take your nausea medication as directed.  BELOW ARE SYMPTOMS THAT SHOULD BE REPORTED IMMEDIATELY: *FEVER GREATER THAN 100.4 F (38 C) OR HIGHER *CHILLS OR SWEATING *NAUSEA AND VOMITING THAT IS NOT CONTROLLED WITH YOUR NAUSEA MEDICATION *UNUSUAL SHORTNESS OF BREATH *UNUSUAL BRUISING OR BLEEDING *URINARY PROBLEMS (pain or burning when urinating, or frequent urination) *BOWEL PROBLEMS (unusual diarrhea, constipation, pain near the anus) TENDERNESS IN MOUTH AND THROAT WITH OR WITHOUT PRESENCE OF ULCERS (sore throat, sores in mouth, or a toothache) UNUSUAL RASH, SWELLING OR PAIN  UNUSUAL VAGINAL DISCHARGE OR ITCHING   Items with * indicate a potential emergency and should be followed up as soon as possible or go to the Emergency Department if any problems should occur.  Please show the CHEMOTHERAPY ALERT CARD or IMMUNOTHERAPY  ALERT CARD at check-in to the Emergency Department and triage nurse.  Should you have questions after your visit or need to cancel or reschedule your appointment, please contact [facility] - A DEPT OF [facility]  Dept: [phone]  and follow the prompts.  Office hours are 8:00 a.m. to 4:30 p.m. Monday - Friday. Please note that voicemails left after 4:00 p.m. may not be returned until the following business day.  We are closed weekends and major holidays. You have access to a nurse at all times for urgent questions. Please call the main number to the clinic Dept: [phone] and follow the prompts.   For any non-urgent questions, you may also contact your provider using MyChart. We now offer e-Visits for anyone 60 and older to request care online for non-urgent symptoms. For details visit [email or web address].   Also download the MyChart app! Go to the app store, search "MyChart", open the app, select [place], and log in with your MyChart username and password.

## 2023-10-30 ENCOUNTER — Other Ambulatory Visit: Payer: Self-pay | Admitting: *Deleted

## 2023-10-31 ENCOUNTER — Inpatient Hospital Stay: Admitting: Nutrition

## 2023-10-31 ENCOUNTER — Inpatient Hospital Stay: Admitting: Internal Medicine

## 2023-10-31 ENCOUNTER — Other Ambulatory Visit

## 2023-10-31 ENCOUNTER — Encounter: Payer: Self-pay | Admitting: Hematology and Oncology

## 2023-10-31 ENCOUNTER — Inpatient Hospital Stay

## 2023-10-31 ENCOUNTER — Inpatient Hospital Stay: Admitting: Hematology and Oncology

## 2023-10-31 VITALS — BP 91/56 | HR 72 | Temp 98.2°F | Resp 14

## 2023-10-31 VITALS — BP 109/66 | HR 74 | Temp 98.3°F | Resp 16 | Ht 62.0 in | Wt 106.9 lb

## 2023-10-31 DIAGNOSIS — C50212 Malignant neoplasm of upper-inner quadrant of left female breast: Secondary | ICD-10-CM

## 2023-10-31 DIAGNOSIS — C50912 Malignant neoplasm of unspecified site of left female breast: Secondary | ICD-10-CM

## 2023-10-31 DIAGNOSIS — C787 Secondary malignant neoplasm of liver and intrahepatic bile duct: Secondary | ICD-10-CM | POA: Diagnosis not present

## 2023-10-31 DIAGNOSIS — Z5112 Encounter for antineoplastic immunotherapy: Secondary | ICD-10-CM | POA: Diagnosis not present

## 2023-10-31 DIAGNOSIS — Z95828 Presence of other vascular implants and grafts: Secondary | ICD-10-CM

## 2023-10-31 DIAGNOSIS — Z1721 Progesterone receptor positive status: Secondary | ICD-10-CM | POA: Diagnosis not present

## 2023-10-31 DIAGNOSIS — Z1732 Human epidermal growth factor receptor 2 negative status: Secondary | ICD-10-CM | POA: Diagnosis not present

## 2023-10-31 DIAGNOSIS — Z171 Estrogen receptor negative status [ER-]: Secondary | ICD-10-CM

## 2023-10-31 DIAGNOSIS — C7951 Secondary malignant neoplasm of bone: Secondary | ICD-10-CM | POA: Diagnosis not present

## 2023-10-31 DIAGNOSIS — Z170 Estrogen receptor positive status [ER+]: Secondary | ICD-10-CM | POA: Diagnosis not present

## 2023-10-31 DIAGNOSIS — C7931 Secondary malignant neoplasm of brain: Secondary | ICD-10-CM | POA: Diagnosis not present

## 2023-10-31 LAB — CMP (CANCER CENTER ONLY)
ALT: 6 U/L (ref 0–44)
AST: 10 U/L — ABNORMAL LOW (ref 15–41)
Albumin: 3.6 g/dL (ref 3.5–5.0)
Alkaline Phosphatase: 92 U/L (ref 38–126)
Anion gap: 4 — ABNORMAL LOW (ref 5–15)
BUN: 8 mg/dL (ref 6–20)
CO2: 30 mmol/L (ref 22–32)
Calcium: 9.6 mg/dL (ref 8.9–10.3)
Chloride: 103 mmol/L (ref 98–111)
Creatinine: 0.65 mg/dL (ref 0.44–1.00)
GFR, Estimated: >60 mL/min (ref >60)
Glucose, Bld: 84 mg/dL (ref 70–99)
Potassium: 4.2 mmol/L (ref 3.5–5.1)
Sodium: 137 mmol/L (ref 135–145)
Total Bilirubin: 0.2 mg/dL (ref 0.0–1.2)
Total Protein: 7.5 g/dL (ref 6.5–8.1)

## 2023-10-31 LAB — CBC WITH DIFFERENTIAL (CANCER CENTER ONLY)
Abs Immature Granulocytes: 0.04 10*3/uL (ref 0.00–0.07)
Basophils Absolute: 0.1 10*3/uL (ref 0.0–0.1)
Basophils Relative: 1 %
Eosinophils Absolute: 1.2 10*3/uL — ABNORMAL HIGH (ref 0.0–0.5)
Eosinophils Relative: 11 %
HCT: 30 % — ABNORMAL LOW (ref 36.0–46.0)
Hemoglobin: 9.7 g/dL — ABNORMAL LOW (ref 12.0–15.0)
Immature Granulocytes: 0 %
Lymphocytes Relative: 20 %
Lymphs Abs: 2.2 10*3/uL (ref 0.7–4.0)
MCH: 26.4 pg (ref 26.0–34.0)
MCHC: 32.3 g/dL (ref 30.0–36.0)
MCV: 81.5 fL (ref 80.0–100.0)
Monocytes Absolute: 0.8 10*3/uL (ref 0.1–1.0)
Monocytes Relative: 7 %
Neutro Abs: 6.8 10*3/uL (ref 1.7–7.7)
Neutrophils Relative %: 61 %
Platelet Count: 328 10*3/uL (ref 150–400)
RBC: 3.68 MIL/uL — ABNORMAL LOW (ref 3.87–5.11)
RDW: 17.4 % — ABNORMAL HIGH (ref 11.5–15.5)
WBC Count: 11.1 10*3/uL — ABNORMAL HIGH (ref 4.0–10.5)
nRBC: 0 % (ref 0.0–0.2)

## 2023-10-31 MED ORDER — ACETAMINOPHEN 325 MG PO TABS
650.0000 mg | ORAL_TABLET | Freq: Once | ORAL | Status: AC
  Administered 2023-10-31: 650 mg via ORAL
  Filled 2023-10-31: qty 2

## 2023-10-31 MED ORDER — TRASTUZUMAB-ANNS CHEMO 150 MG IV SOLR
6.0000 mg/kg | Freq: Once | INTRAVENOUS | Status: AC
  Administered 2023-10-31: 300 mg via INTRAVENOUS
  Filled 2023-10-31: qty 14.29

## 2023-10-31 MED ORDER — HYDROCOD POLI-CHLORPHE POLI ER 10-8 MG/5ML PO SUER: 5.0000 mL | Freq: Two times a day (BID) | ORAL | 0 refills | Status: DC

## 2023-10-31 MED ORDER — DIPHENHYDRAMINE HCL 25 MG PO CAPS
25.0000 mg | ORAL_CAPSULE | Freq: Once | ORAL | Status: AC
  Administered 2023-10-31: 25 mg via ORAL
  Filled 2023-10-31: qty 1

## 2023-10-31 MED ORDER — SODIUM CHLORIDE 0.9% FLUSH
10.0000 mL | INTRAVENOUS | Status: DC | PRN
  Administered 2023-10-31: 10 mL

## 2023-10-31 MED ORDER — HEPARIN SOD (PORK) LOCK FLUSH 100 UNIT/ML IV SOLN
500.0000 [IU] | Freq: Once | INTRAVENOUS | Status: AC | PRN
  Administered 2023-10-31: 500 [IU]

## 2023-10-31 MED ORDER — SODIUM CHLORIDE 0.9 % IV SOLN: Freq: Once | INTRAVENOUS | Status: AC

## 2023-10-31 MED ORDER — PREDNISONE 50 MG PO TABS: ORAL_TABLET | ORAL | 1 refills | Status: DC

## 2023-10-31 NOTE — Patient Instructions (Signed)
 [facility] - A DEPT OF [facility]  Discharge Instructions: Thank you for choosing [facility] to provide your oncology and hematology care.   If you have a lab appointment with the Cancer Center, please go directly to the Cancer Center and check in at the registration area.   Wear comfortable clothing and clothing appropriate for easy access to any Portacath or PICC line.   We strive to give you quality time with your provider. You may need to reschedule your appointment if you arrive late (15 or more minutes).  Arriving late affects you and other patients whose appointments are after yours.  Also, if you miss three or more appointments without notifying the office, you may be dismissed from the clinic at the provider's discretion.      For prescription refill requests, have your pharmacy contact our office and allow 72 hours for refills to be completed.    Today you received the following chemotherapy and/or immunotherapy agents: Kanjiniti      To help prevent nausea and vomiting after your treatment, we encourage you to take your nausea medication as directed.  BELOW ARE SYMPTOMS THAT SHOULD BE REPORTED IMMEDIATELY: *FEVER GREATER THAN 100.4 F (38 C) OR HIGHER *CHILLS OR SWEATING *NAUSEA AND VOMITING THAT IS NOT CONTROLLED WITH YOUR NAUSEA MEDICATION *UNUSUAL SHORTNESS OF BREATH *UNUSUAL BRUISING OR BLEEDING *URINARY PROBLEMS (pain or burning when urinating, or frequent urination) *BOWEL PROBLEMS (unusual diarrhea, constipation, pain near the anus) TENDERNESS IN MOUTH AND THROAT WITH OR WITHOUT PRESENCE OF ULCERS (sore throat, sores in mouth, or a toothache) UNUSUAL RASH, SWELLING OR PAIN  UNUSUAL VAGINAL DISCHARGE OR ITCHING   Items with * indicate a potential emergency and should be followed up as soon as possible or go to the Emergency Department if any problems should occur.  Please show the CHEMOTHERAPY ALERT CARD or IMMUNOTHERAPY  ALERT CARD at check-in to the Emergency Department and triage nurse.  Should you have questions after your visit or need to cancel or reschedule your appointment, please contact [facility] - A DEPT OF [facility]  Dept: [phone]  and follow the prompts.  Office hours are 8:00 a.m. to 4:30 p.m. Monday - Friday. Please note that voicemails left after 4:00 p.m. may not be returned until the following business day.  We are closed weekends and major holidays. You have access to a nurse at all times for urgent questions. Please call the main number to the clinic Dept: [phone] and follow the prompts.   For any non-urgent questions, you may also contact your provider using MyChart. We now offer e-Visits for anyone 45 and older to request care online for non-urgent symptoms. For details visit [email or web address].   Also download the MyChart app! Go to the app store, search "MyChart", open the app, select [place], and log in with your MyChart username and password.

## 2023-10-31 NOTE — Progress Notes (Signed)
 Nutrition follow-up completed with patient during infusion for metastatic breast cancer.  Patient is followed by Dr. Gudena.  Patient receives Trastuzumab  IV every 3 weeks.  Weight: 106 pounds 14.4 ounces October 31, 2023 109 pounds August 08, 2023 118 pounds 6.4 ounces May 08, 2023  10% weight loss over 6 months.  Labs reviewed.  Reports fatigue which she relates to Keppra .  Overall she has good energy level.  Feels she is eating ok.  She has stopped drinking [employer] 1.4.  Reports she tolerated this for a long time and then developed diarrhea.  Denies nausea, vomiting, constipation, and diarrhea.  Nutrition diagnosis: Unintended weight loss related to inadequate oral intake as evidenced by 10% weight loss over 6 months.  Intervention: Recommend increase calories and protein throughout the day by adding small snacks between meals. Consider trying one half carton of [facility] 1.4 in evaluating tolerance.  Patient agreeable. Discussed other high-calorie foods.  Monitoring, evaluation, goals: Patient will tolerate adequate calories and protein in his weight loss.  Next visit: Tuesday, June 3, during infusion.  **Disclaimer: This note was dictated with voice recognition software. Similar sounding words can inadvertently be transcribed and this note may contain transcription errors which may not have been corrected upon publication of note.**

## 2023-10-31 NOTE — Assessment & Plan Note (Signed)
 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  SRS brain: 08/25/2017-09/04/2017 2. Anti Her 2 therapy with Lapatinib  started 09/17/2017-05/14/2019: Stopped for progression 3.  I discontinued tamoxifen  and started her on letrozole  2.5 mg daily.  05/14/2019 stopped for progression 4.  Stereotactic radiosurgery 12/19/2017 to the new right parietal lobe metastases. 5.  Kadcyla : Received 16 cycles discontinued 05/05/2020 6.  Tucatinib  Xeloda : Discontinued because of hand-foot syndrome 06/11/2020-12/15/2020 7. Herceptin /Neratinib : unable to tolerate due to diarrhea and weight loss 8.  SRS to new brain mets 10/01/2021 9.  Resection of brain metastases at [facility] 12/29/2022: Metastatic breast cancer proc panel pending 10.  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 Mark Sil treated her with Keppra , brain MRI scheduled for 12/14/2023  Return to clinic every 3 weeks for Herceptin  and every 6 weeks of follow-up with providers.

## 2023-10-31 NOTE — Progress Notes (Signed)
 Patient Care Team: Tena Feeling, MD as PCP - General (Internal Medicine) Ethlyn Herd, MD as Referring Physician (Emergency Medicine)  DIAGNOSIS:  Encounter Diagnosis  Name Primary?   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 Ki-67 was 70% HER-2 was amplified with a ratio 2.91   07/12/2012 - 07/17/2013 Neo-Adjuvant Chemotherapy   TCH 6 followed by Herceptin  maintenance   08/14/2012 Genetic Testing   Negative genetic testing on the [facility].  The report date is August 14, 2012.  The [facility] gene panel offered by [facility] includes sequencing and deletion/duplication testing of the following 25 genes: APC, ATM, , BARD1, BMPR1A, BRCA1, BRCA2, BRIP1, CHD1, CDK4, CDKN2A, CHEK2, EPCAM (large rearrangement only), MLH1, MSH2, MSH6, MUTYH, NBN, PALB2, PMS2, PTEN, RAD51C, RAD51D, SMAD4, STK11, and TP53.     12/11/2012 Surgery   Left breast lumpectomy: 1.8 cm tumor 1 positive sentinel node, axillary lymph node dissection 02/08/2013 showed 0/13 lymph nodes   03/25/2013 - 05/06/2013 Radiation Therapy   Adjuvant radiation therapy   06/05/2013 - 07/20/2017 Anti-estrogen oral therapy   Tamoxifen  20 mg daily   07/27/2017 Relapse/Recurrence   MRI Brain: 3.4 x 2.9 x 2.9 cm RIGHT frontal lobe mass with imaging characteristics of solitary metastasis. Extensive vasogenic edema resulting in 9 mm RIGHT to LEFT midline shift. Equivocal very early LEFT ventricle entrapment.    08/04/2017 Surgery   Rt frontal brain resection: Poorly differentiated tumor IHC suggests breast primary ER and PR  Positive   08/25/2017 - 09/04/2017 Radiation Therapy   Stereotactic radiation   09/18/2017 -  Anti-estrogen oral therapy   Lapatinib  with letrozole    12/18/2017 - 12/19/2017 Radiation Therapy   New right parietal lobe metastases status post [facility]   08/27/2018 - 08/27/2020 Radiation Therapy   SRS to new brain metastases   05/09/2019 Relapse/Recurrence   Interval increase in size of the enhancing nodular left internal mammary soft tissue 2.8 cm.  Redemonstrated enlarged supraclavicular, lower cervical and lower posterior cervical nodes unchanged.  Interval increase in the bony erosion of the posterior and lateral left third rib, increasing soft tissue lesion eroding the left sternal body 3.1 cm was 2.5 cm.  Bronchiectatic changes   06/19/2019 - 05/05/2020 Chemotherapy   ado-trastuzumab emtansine  (KADCYLA )     06/15/2020 -  Chemotherapy   Xeloda , Tucatinib , Herceptin     04/12/2022 - 09/06/2022 Chemotherapy   Patient is on Treatment Plan : BREAST Trastuzumab  IV (8/6) or SQ (600) D1 q21d     09/27/2022 -  Chemotherapy   Patient is on Treatment Plan : BREAST MAINTENANCE Trastuzumab  IV every 3 weeks (21 days)     11/08/2022 Miscellaneous   Adding low-dose neratinib  (80 mg) to Herceptin    12/20/2022 Cancer Staging   Staging form: Breast, AJCC 7th Edition - Pathologic: Stage IV (M1) - Signed by Percival Brace, NP on 12/20/2022   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 The seizure began in the left leg, then moved to the left arm, and eventually resulted in full body convulsions. At the time of the seizure, the patient was not on Keppra , which she had stopped taking a month after her last surgery in June. Since the seizure, she has resumed taking Keppra  twice daily.  The patient is also receiving Herceptin  infusions for cancer treatment, which she has been tolerating well. She has not experienced any problems or stress related to the infusions.  In addition to the seizure and cancer, the patient has been experiencing coughing fits that last for a while. These fits have been occurring during the day for the past couple of months. The patient has been off antifungal medications for a while and is scheduled to see a doctor for a bronchoscopy to investigate the cause of the coughing. Despite the coughing, the patient reports feeling fine and does not have any pain in her lungs.     ALLERGIES:  is allergic to aspirin, protonix  [pantoprazole ], doxycycline , promethazine -codeine , sulfamethoxazole-trimethoprim , and iodinated contrast media.  MEDICATIONS:  Current Outpatient Medications  Medication Sig Dispense Refill   calcium carbonate (TUMS EX) 750 MG chewable tablet Chew by mouth.     escitalopram (LEXAPRO) 10 MG tablet Take 10 mg by mouth daily.     levalbuterol  (XOPENEX ) 0.63 MG/3ML nebulizer solution Take 3 mLs (0.63 mg total) by nebulization every 4 (four) hours as needed for wheezing or shortness of breath. 90 mL 12   levETIRAcetam  (KEPPRA ) 500 MG tablet Take 500 mg by mouth 2 (two) times daily.     SYMBICORT  160-4.5 MCG/ACT inhaler Inhale 2 puffs into the lungs 2 (two) times daily.     acetaminophen  (TYLENOL ) 325 MG tablet Take 2 tablets (650 mg total) by mouth every 6 (six) hours as needed for mild pain (or Fever >/= 101). (Patient not taking: Reported on 10/31/2023) 12 tablet 0   albuterol   (VENTOLIN  HFA) 108 (90 Base) MCG/ACT inhaler Inhale 2 puffs into the lungs every 6 (six) hours as needed for wheezing or shortness of breath (cough). (Patient not taking: Reported on 10/31/2023) 18 g 5   chlorpheniramine -HYDROcodone  (TUSSIONEX) 10-8 MG/5ML Take 5 mLs by mouth 2 (two) times daily. 115 mL 0   lidocaine -prilocaine  (EMLA ) cream APPLY EXTERNALLY TO THE AFFECTED AREA 1 TIME (Patient not taking: Reported on 10/31/2023) 30 g 3   Multiple Vitamins-Minerals (EMERGEN-C VITAMIN C PO) Take by mouth. (Patient not taking: Reported on 10/31/2023)     naproxen sodium (ALEVE) 220 MG tablet Take by mouth. (Patient not taking: Reported on 10/31/2023)     ondansetron  (ZOFRAN -ODT) 4 MG disintegrating tablet Take 4 mg by mouth every 8 (eight) hours as needed. (Patient not taking: Reported on 10/31/2023)     predniSONE  (DELTASONE ) 50 MG tablet Take 1 tablet 13 hrs prior to scan, 2nd tablet 7 hrs prior, 3rd tablet 1 hr prior 3 tablet 1   No current facility-administered medications for this visit.   Facility-Administered Medications Ordered in Other Visits  Medication Dose Route Frequency Provider Last Rate Last Admin   alteplase  (CATHFLO ACTIVASE ) injection 2 mg  2 mg Intracatheter Once  PRN Cameron Cea, MD       heparin  lock flush 100 unit/mL  500 Units Intracatheter Once PRN Percival Brace, NP       sodium chloride  flush (NS) 0.9 % injection 10 mL  10 mL Intracatheter PRN Cameron Cea, MD   10 mL at 07/06/21 1428   sodium chloride  flush (NS) 0.9 % injection 10 mL  10 mL Intracatheter PRN Percival Brace, NP       trastuzumab -anns (KANJINTI ) 300 mg in sodium chloride  0.9 % 250 mL chemo infusion  6 mg/kg (Treatment Plan Recorded) Intravenous Once Percival Brace, NP        PHYSICAL EXAMINATION: ECOG PERFORMANCE STATUS: 1 - Symptomatic but completely ambulatory  Vitals:   10/31/23 1053  BP: 109/66  Pulse: 74  Resp: 16  Temp: 98.3 F (36.8 C)  SpO2: 99%   Filed Weights    10/31/23 1053  Weight: 106 lb 14.4 oz (48.5 kg)      LABORATORY DATA:  I have reviewed the data as listed    Latest Ref Rng & Units 10/31/2023   10:17 AM 10/10/2023    1:33 PM 03/28/2023   10:51 AM  CMP  Glucose 70 - 99 mg/dL 84  161  84   BUN 6 - 20 mg/dL 8  8  9    Creatinine 0.44 - 1.00 mg/dL 0.96  0.45  4.09   Sodium 135 - 145 mmol/L 137  136  136   Potassium 3.5 - 5.1 mmol/L 4.2  4.3  4.7   Chloride 98 - 111 mmol/L 103  103  101   CO2 22 - 32 mmol/L 30  28  24    Calcium 8.9 - 10.3 mg/dL 9.6  9.6  9.3   Total Protein 6.5 - 8.1 g/dL 7.5  7.3  6.3   Total Bilirubin 0.0 - 1.2 mg/dL 0.2  0.2  0.2   Alkaline Phos 38 - 126 U/L 92  88    AST 15 - 41 U/L 10  8  13    ALT 0 - 44 U/L 6  6  13      Lab Results  Component Value Date   WBC 11.1 (H) 10/31/2023   HGB 9.7 (L) 10/31/2023   HCT 30.0 (L) 10/31/2023   MCV 81.5 10/31/2023   PLT 328 10/31/2023   NEUTROABS 6.8 10/31/2023    ASSESSMENT & PLAN:  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  SRS brain: 08/25/2017-09/04/2017 2. Anti Her 2 therapy with Lapatinib  started 09/17/2017-05/14/2019: Stopped for progression 3.  I discontinued tamoxifen  and started her on letrozole  2.5 mg daily.  05/14/2019 stopped for progression 4.  Stereotactic radiosurgery 12/19/2017 to the new right parietal lobe metastases. 5.  Kadcyla : Received 16 cycles discontinued 05/05/2020 6.  Tucatinib  Xeloda : Discontinued because of  hand-foot syndrome 06/11/2020-12/15/2020 7. Herceptin /Neratinib : unable to tolerate due to diarrhea and weight loss 8.  SRS to new brain mets 10/01/2021 9.  Resection [facility] metastases [facility] 12/29/2022: Metastatic breast cancer proc panel  pending 10.  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 Mark Sil treated her with Keppra , brain MRI scheduled for 12/14/2023  Return to clinic every 3 weeks for Herceptin  and every 6 weeks of follow-up with providers.   Assessment & Plan Malignant neoplasm of left breast with metastases Metastatic breast cancer with brain, liver, and bone involvement. Herceptin  well tolerated. - Continue Herceptin  infusions. - Administer prednisone  and Benadryl  prior to CT scan as per previous protocol.  Seizure disorder Seizure occurred one month ago, now on Keppra  twice daily. Neurologist advised no emergency response unless seizure exceeds a couple of minutes. - Continue Keppra  twice daily. - Monitor for further seizures and follow neurologist's emergency  response advice.  Chronic cough Increased frequency of chronic cough. Off antifungal medications. Bronchoscopy planned for further assessment. - Follow up with Dr. Pryor Browning on April 28th for lung evaluation. - Plan bronchoscopy to obtain sample and assess lung condition.      Orders Placed This Encounter  Procedures   CT CHEST ABDOMEN PELVIS W CONTRAST    Standing Status:   Future    Expected Date:   12/25/2023    Expiration Date:   10/30/2024    If indicated for the ordered procedure, I authorize the administration of contrast media per Radiology protocol:   Yes    Does the patient have a contrast media/X-ray dye allergy ?:   No    Is patient pregnant?:   No    Preferred imaging location?:   [facility]    Release to patient:   Immediate    If indicated for the ordered procedure, I authorize the administration of oral contrast media per Radiology protocol:   Yes   The patient has a good understanding of the overall plan. she agrees with it. she will call with any problems that may develop before the next visit here. Total time spent: 40 mins including face to face time and time spent for planning, charting and co-ordination of care   Margert Sheerer, MD 10/31/23

## 2023-11-02 ENCOUNTER — Other Ambulatory Visit: Payer: Self-pay | Admitting: Internal Medicine

## 2023-11-02 ENCOUNTER — Inpatient Hospital Stay (HOSPITAL_BASED_OUTPATIENT_CLINIC_OR_DEPARTMENT_OTHER): Admitting: Internal Medicine

## 2023-11-02 ENCOUNTER — Other Ambulatory Visit: Payer: Self-pay

## 2023-11-02 DIAGNOSIS — C7931 Secondary malignant neoplasm of brain: Secondary | ICD-10-CM | POA: Diagnosis not present

## 2023-11-02 NOTE — Progress Notes (Signed)
 I connected with Melissa [name] on 11/02/23 at  2:15 PM EDT by telephone visit and verified that I am speaking with the correct person using two identifiers.  I discussed the limitations, risks, security and privacy concerns of performing an evaluation and management service by telemedicine and the availability of in-person appointments. I also discussed with the patient that there may be a patient responsible charge related to this service. The patient expressed understanding and agreed to proceed.   Other persons participating in the visit and their role in the encounter:  n/a  Patient's location:  Home Provider's location:  Office Chief Complaint:  Metastasis to brain [facility])  History of Present Ilness: Melissa [name] describes notable increase in fatigue since starting the Keppra  500mg  twice per day.  When medicine wears off towards the evening, she actually feels closer to prior normal/baseline.  No breakthrough events since her hospitalization last month.  Continues on herceptin  infusions with Dr. Gudena.  Observations: Language and cognition at baseline  Assessment and Plan: Metastasis to brain [facility])  [name] is clinically stable today, but with notable fatigue symptoms as is common with Keppra .    We agreed to reduce the dose to 250/500; she understands there will be increased risk of breakthrough seizure event.  She is willing to accept that risk based on quality of life measures.      Follow Up Instructions: We ask that Melissa [name] return to clinic in 2 months following next brain MRI, or sooner as needed.  I discussed the assessment and treatment plan with the patient.  The patient was provided an opportunity to ask questions and all were answered.  The patient agreed with the plan and demonstrated understanding of the instructions.    The patient was advised to call back or seek an in-person evaluation if the symptoms worsen or if the condition fails to improve as anticipated.     Sharai Overbay K Clennon Nasca, MD   I provided 20 minutes of non face-to-face telephone visit time during this encounter, and > 50% was spent counseling as documented under my assessment & plan.

## 2023-11-05 ENCOUNTER — Encounter: Payer: Self-pay | Admitting: Infectious Disease

## 2023-11-05 DIAGNOSIS — B389 Coccidioidomycosis, unspecified: Secondary | ICD-10-CM

## 2023-11-05 HISTORY — DX: Coccidioidomycosis, unspecified: B38.9

## 2023-11-05 NOTE — Progress Notes (Unsigned)
 Subjective:  Chief complaint: follow-up for ? Mold infection    Patient ID: Melissa [name], female    DOB: 01-Dec-1965, 58 y.o.   MRN: [id number]  HPI  Discussed the use of AI scribe software for clinical note transcription with the patient, who gave verbal consent to proceed.  History of Present Illness   The patient, with a history of coccidioidomycosis, COPD, and breast cancer, with metastases, ? having been treated for possible mold with voriconazole  and then cresemba  and back and forth between the 2 agentspresents with a persistent cough. She reports that the cough is worse when lying on her back and often disrupts her sleep. She has been taking Benzonatate at night, which has helped to alleviate the cough and improve her sleep. She also reports occasional coughing fits during the day, but these are not associated with the production of phlegm. She has noticed a small amount of blood in her mucus in the mornings. She also reports a recent weight loss, which she attributes to her ongoing cancer treatment Of not she had DC her Cresemba  because she believed it actually was making her cough worse.. She recently had a seizure and was put back on Keppra , but the dosage was reduced due to excessive daytime sleepiness. She is due for a brain scan in June to monitor two small tumors. She also reports a history of smoking, but has switched to vaping, which she believes causes less coughing.       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)    Coccidioidomycosis 11/05/2023   Complication of anesthesia    bp dropped + desat    COPD (chronic obstructive pulmonary disease)  (HCC)    Dyspnea    DOE   GERD (gastroesophageal reflux disease)    H/O coccidioidomycosis    was reason for lung lobectomy   Headache(784.0)    due to eye strain or not eating   Hemoptysis 05/08/2023   History of anemia    no current problem   History of asthma    as a child   History of breast cancer 2014   left   History of chemotherapy    finished 07/17/2013   History of hiatal hernia    AGE 89   Hx of radiation therapy 03/25/13-05/06/13   left breast 5000 cGy/25 sessions, left breast boost 1000 cGy/5 sessions   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   Personal history of chemotherapy    for liver cancer   Personal history of radiation therapy    Pneumonia    LAST FLARE UP 01/2018   Rash 02/22/2021   Runny nose 07/30/2013   clear drainage   Smoker 05/24/2021   Wears dentures    upper    Past Surgical History:  Procedure Laterality Date   APPLICATION OF CRANIAL NAVIGATION N/A 08/04/2017   Procedure: APPLICATION OF CRANIAL NAVIGATION;  Surgeon: [name], Raelene Bullocks, MD;  Location: [facility] OR;  Service: Neurosurgery;  Laterality: N/A;   AXILLARY LYMPH NODE DISSECTION Left 02/08/2013   Procedure: LEFT AXILLARY DISSECTION;  Surgeon: Quitman Bucy, MD;  Location: [facility];  Service: General;  Laterality: Left;   BREAST CYST EXCISION Right 2006   BREAST LUMPECTOMY Left 2014   BREAST LUMPECTOMY WITH NEEDLE LOCALIZATION AND AXILLARY SENTINEL LYMPH NODE BX Left 12/31/2012   Procedure: NEEDLE LOCALIZATION LEFT BREAST LUMPECTOMY AND LEFT AXILLARY SENTENIAL LYMPH NODE BX;  Surgeon: Quitman Bucy, MD;  Location: [facility];  Service: General;  Laterality: Left;   BRONCHIAL BRUSHINGS  03/17/2020   Procedure: BRONCHIAL BRUSHINGS;  Surgeon: Prudy Brownie, DO;  Location: [facility];  Service: Pulmonary;;   BRONCHIAL WASHINGS  03/17/2020   Procedure: BRONCHIAL WASHINGS;  Surgeon: Prudy Brownie, DO;  Location: [facility];  Service: Pulmonary;;   CESAREAN SECTION   1995/1996   CRANIOTOMY Right 08/04/2017   Procedure: Right Frontal craniotomy for resection of tumor with stereotactic navigation;  Surgeon: [name], Raelene Bullocks, MD;  Location: [facility] OR;  Service: Neurosurgery;  Laterality: Right;  Right Frontal craniotomy for resection of tumor with stereotactic navigation   IR CV LINE INJECTION  02/19/2021   IR CV LINE INJECTION  11/12/2021   IR IMAGING GUIDED PORT INSERTION  05/31/2019   LUNG LOBECTOMY Left 05/1996   upper lobe - due to [facility] Fever   PORT-A-CATH REMOVAL Right 08/02/2013   Procedure: REMOVAL PORT-A-CATH;  Surgeon: Quitman Bucy, MD;  Location: [facility];  Service: General;  Laterality: Right;   PORTACATH PLACEMENT  07/02/2012   Procedure: INSERTION PORT-A-CATH;  Surgeon: Quitman Bucy, MD;  Location: [facility];  Service: General;  Laterality: N/A;  right   VIDEO BRONCHOSCOPY N/A 03/17/2020   Procedure: VIDEO BRONCHOSCOPY;  Surgeon: Prudy Brownie, DO;  Location: [facility];  Service: Pulmonary;  Laterality: N/A;   VIDEO BRONCHOSCOPY WITH ENDOBRONCHIAL ULTRASOUND N/A 05/23/2018   Procedure: VIDEO BRONCHOSCOPY WITH ENDOBRONCHIAL ULTRASOUND;  Surgeon: Prudy Brownie, DO;  Location: [facility];  Service: Thoracic;  Laterality: N/A;    Family History  Problem Relation Age of Onset   Emphysema Mother        was a smoker   Heart disease Mother    Melanoma Mother        dx in her 47s   Breast cancer Mother 70   Breast cancer Cousin 41 - 57       mother's maternal cousin   Asthma Brother       Social History   Socioeconomic History   Marital status: Married    Spouse name: Not on file   Number of children: 2   Years of education: Not on file   Highest education level: Not on file  Occupational History   Occupation: Investment banker, corporate  Tobacco Use   Smoking status: Every Day    Current packs/day: 0.30    Average packs/day: 0.3 packs/day for 38.0 years (11.4 ttl pk-yrs)    Types: Cigarettes    Smokeless tobacco: Never   Tobacco comments:    12 cigarettes a day-05/08/23  Vaping Use   Vaping status: Never Used  Substance and Sexual Activity   Alcohol use: No   Drug use: No   Sexual activity: Not Currently  Other Topics Concern   Not on file  Social History Narrative   Not on file   Social Drivers of Health   Financial Resource Strain: Low Risk  (01/03/2023)   Received [facility], [facility]   Overall Financial Resource Strain (CARDIA)  Difficulty of Paying Living Expenses: Not hard at all  Food Insecurity: No Food Insecurity (01/30/2023)   Hunger Vital Sign    Worried About Running Out of Food in the Last Year: Never true    Ran Out of Food in the Last Year: Never true  Transportation Needs: No Transportation Needs (01/30/2023)   PRAPARE - Administrator, Civil Service (Medical): No    Lack of Transportation (Non-Medical): No  Physical Activity: Not on file  Stress: Not on file  Social Connections: Not on file    Allergies  Allergen Reactions   Aspirin Anaphylaxis and Shortness Of Breath    THROAT CLOSES   Protonix  [Pantoprazole ] Nausea Only and Other (See Comments)    Also caused a "film in the mouth" and caused chest pressure   Doxycycline  Other (See Comments)    Hallucinations, headaches   Promethazine -Codeine  Cough    Worsening cough and insomnia    Sulfamethoxazole-Trimethoprim  Other (See Comments)   Iodinated Contrast Media Rash    Patient allergic to contrast used in radiation oncology for CT simulation      Current Outpatient Medications:    acetaminophen  (TYLENOL ) 325 MG tablet, Take 2 tablets (650 mg total) by mouth every 6 (six) hours as needed for mild pain (or Fever >/= 101). (Patient not taking: Reported on 10/31/2023), Disp: 12 tablet, Rfl: 0   albuterol  (VENTOLIN  HFA) 108 (90 Base) MCG/ACT inhaler, Inhale 2 puffs into the lungs every 6 (six) hours as needed for wheezing or shortness of  breath (cough). (Patient not taking: Reported on 10/31/2023), Disp: 18 g, Rfl: 5   calcium carbonate (TUMS EX) 750 MG chewable tablet, Chew by mouth., Disp: , Rfl:    chlorpheniramine -HYDROcodone  (TUSSIONEX) 10-8 MG/5ML, Take 5 mLs by mouth 2 (two) times daily., Disp: 115 mL, Rfl: 0   escitalopram (LEXAPRO) 10 MG tablet, Take 10 mg by mouth daily., Disp: , Rfl:    levalbuterol  (XOPENEX ) 0.63 MG/3ML nebulizer solution, Take 3 mLs (0.63 mg total) by nebulization every 4 (four) hours as needed for wheezing or shortness of breath., Disp: 90 mL, Rfl: 12   levETIRAcetam  (KEPPRA ) 500 MG tablet, Take 250 mg by mouth 2 (two) times daily., Disp: , Rfl:    lidocaine -prilocaine  (EMLA ) cream, APPLY EXTERNALLY TO THE AFFECTED AREA 1 TIME (Patient not taking: Reported on 10/31/2023), Disp: 30 g, Rfl: 3   Multiple Vitamins-Minerals (EMERGEN-C VITAMIN C PO), Take by mouth. (Patient not taking: Reported on 10/31/2023), Disp: , Rfl:    naproxen sodium (ALEVE) 220 MG tablet, Take by mouth. (Patient not taking: Reported on 10/31/2023), Disp: , Rfl:    ondansetron  (ZOFRAN -ODT) 4 MG disintegrating tablet, Take 4 mg by mouth every 8 (eight) hours as needed. (Patient not taking: Reported on 10/31/2023), Disp: , Rfl:    predniSONE  (DELTASONE ) 50 MG tablet, Take 1 tablet 13 hrs prior to scan, 2nd tablet 7 hrs prior, 3rd tablet 1 hr prior, Disp: 3 tablet, Rfl: 1   SYMBICORT  160-4.5 MCG/ACT inhaler, Inhale 2 puffs into the lungs 2 (two) times daily., Disp: , Rfl:  No current facility-administered medications for this visit.  Facility-Administered Medications Ordered in Other Visits:    alteplase  (CATHFLO ACTIVASE ) injection 2 mg, 2 mg, Intracatheter, Once PRN, Gudena, Vinay, MD   sodium chloride  flush (NS) 0.9 % injection 10 mL, 10 mL, Intracatheter, PRN, Gudena, Vinay, MD, 10 mL at 07/06/21 1428   Review of Systems  Constitutional:  Positive for unexpected weight change. Negative for activity change, appetite  change, chills,  diaphoresis, fatigue and fever.  HENT:  Negative for congestion, rhinorrhea, sinus pressure, sneezing, sore throat and trouble swallowing.   Eyes:  Negative for photophobia and visual disturbance.  Respiratory:  Positive for cough. Negative for chest tightness, shortness of breath, wheezing and stridor.   Cardiovascular:  Negative for chest pain, palpitations and leg swelling.  Gastrointestinal:  Negative for abdominal distention, abdominal pain, anal bleeding, blood in stool, constipation, diarrhea, nausea and vomiting.  Genitourinary:  Negative for difficulty urinating, dysuria, flank pain and hematuria.  Musculoskeletal:  Negative for arthralgias, back pain, gait problem, joint swelling and myalgias.  Skin:  Negative for color change, pallor, rash and wound.  Neurological:  Positive for seizures. Negative for dizziness, tremors, weakness and light-headedness.  Hematological:  Negative for adenopathy. Does not bruise/bleed easily.  Psychiatric/Behavioral:  Negative for agitation, behavioral problems, confusion, decreased concentration, dysphoric mood and sleep disturbance.        Objective:   Physical Exam Constitutional:      General: She is not in acute distress.    Appearance: Normal appearance. She is well-developed. She is not ill-appearing or diaphoretic.  HENT:     Head: Normocephalic and atraumatic.     Right Ear: Hearing and external ear normal.     Left Ear: Hearing and external ear normal.     Nose: No nasal deformity or rhinorrhea.  Eyes:     General: No scleral icterus.    Conjunctiva/sclera: Conjunctivae normal.     Right eye: Right conjunctiva is not injected.     Left eye: Left conjunctiva is not injected.     Pupils: Pupils are equal, round, and reactive to light.  Neck:     Vascular: No JVD.  Cardiovascular:     Rate and Rhythm: Normal rate and regular rhythm.     Heart sounds: Normal heart sounds, S1 normal and S2 normal. No murmur heard.    No friction rub.  No gallop.  Pulmonary:     Effort: Prolonged expiration present.     Breath sounds: Wheezing present.  Abdominal:     General: There is no distension.     Palpations: Abdomen is soft.  Musculoskeletal:        General: Normal range of motion.     Right shoulder: Normal.     Left shoulder: Normal.     Cervical back: Normal range of motion and neck supple.     Right hip: Normal.     Left hip: Normal.     Right knee: Normal.     Left knee: Normal.  Lymphadenopathy:     Head:     Right side of head: No submandibular, preauricular or posterior auricular adenopathy.     Left side of head: No submandibular, preauricular or posterior auricular adenopathy.     Cervical: No cervical adenopathy.     Right cervical: No superficial or deep cervical adenopathy.    Left cervical: No superficial or deep cervical adenopathy.  Skin:    General: Skin is warm and dry.     Coloration: Skin is not pale.     Findings: No abrasion, bruising, ecchymosis, erythema, lesion or rash.     Nails: There is no clubbing.  Neurological:     Mental Status: She is alert and oriented to person, place, and time.     Sensory: No sensory deficit.     Coordination: Coordination normal.     Gait: Gait normal.  Psychiatric:  Attention and Perception: She is attentive.        Mood and Affect: Mood normal.        Speech: Speech normal.        Behavior: Behavior normal. Behavior is cooperative.        Thought Content: Thought content normal.        Judgment: Judgment normal.           Assessment & Plan:   Assessment and Plan    ? Mold: Reasonable to hold off on empiric therapy I would vote IF her symptoms worsen in terms of her lungs for her to have repeat bronchoscopy and BAL for cultures to see if there is any fungal organism at play --for now continue to observe off of antifungal therapy  COPD COPD contributing to chronic cough. No acute exacerbation. Smoking cessation discussed; increased coughing  with vaping attempts. - Encourage smoking cessation. - Discuss alternative smoking cessation methods if vaping is not tolerated.  Coccidioidomycosis History of coccidioidomycosis with no current active symptoms. No current need for antifungal treatment.  Seizure disorder Recent seizure on September 21, 2023, led to hospitalization and adjustment of Keppra  dosage. Current management involves reduced morning dose to mitigate daytime sleepiness. - Continue current Keppra  dosage with reduced morning dose. - Monitor for seizure activity and side effects.  Brain tumors History of brain tumors with recent imaging showing two small spots, possibly necrosis or scar tissue. Next brain scan scheduled for December 14, 2023. - Proceed with scheduled brain scan on December 14, 2023. - Follow up with Dr. Sylvie Every on December 19, 2023, to discuss scan results.  Weight loss Weight loss possibly related to cancer treatment.

## 2023-11-06 ENCOUNTER — Encounter: Payer: Self-pay | Admitting: Infectious Disease

## 2023-11-06 ENCOUNTER — Ambulatory Visit: Payer: Medicare HMO | Admitting: Infectious Disease

## 2023-11-06 ENCOUNTER — Other Ambulatory Visit: Payer: Self-pay

## 2023-11-06 VITALS — BP 92/57 | HR 71 | Temp 97.7°F | Ht 62.0 in | Wt 108.0 lb

## 2023-11-06 DIAGNOSIS — Z171 Estrogen receptor negative status [ER-]: Secondary | ICD-10-CM | POA: Diagnosis not present

## 2023-11-06 DIAGNOSIS — J449 Chronic obstructive pulmonary disease, unspecified: Secondary | ICD-10-CM

## 2023-11-06 DIAGNOSIS — G40909 Epilepsy, unspecified, not intractable, without status epilepticus: Secondary | ICD-10-CM | POA: Diagnosis not present

## 2023-11-06 DIAGNOSIS — C50212 Malignant neoplasm of upper-inner quadrant of left female breast: Secondary | ICD-10-CM | POA: Diagnosis not present

## 2023-11-06 DIAGNOSIS — B449 Aspergillosis, unspecified: Secondary | ICD-10-CM

## 2023-11-06 DIAGNOSIS — F17200 Nicotine dependence, unspecified, uncomplicated: Secondary | ICD-10-CM

## 2023-11-06 DIAGNOSIS — B389 Coccidioidomycosis, unspecified: Secondary | ICD-10-CM

## 2023-11-08 ENCOUNTER — Other Ambulatory Visit: Payer: Self-pay

## 2023-11-16 ENCOUNTER — Other Ambulatory Visit: Payer: Self-pay | Admitting: Hematology and Oncology

## 2023-11-16 MED ORDER — HYDROCOD POLI-CHLORPHE POLI ER 10-8 MG/5ML PO SUER: 5.0000 mL | Freq: Two times a day (BID) | ORAL | 0 refills | Status: DC

## 2023-11-21 ENCOUNTER — Ambulatory Visit: Admitting: Internal Medicine

## 2023-11-21 ENCOUNTER — Inpatient Hospital Stay (HOSPITAL_BASED_OUTPATIENT_CLINIC_OR_DEPARTMENT_OTHER): Admitting: Hematology and Oncology

## 2023-11-21 ENCOUNTER — Inpatient Hospital Stay

## 2023-11-21 ENCOUNTER — Inpatient Hospital Stay: Attending: Hematology and Oncology

## 2023-11-21 ENCOUNTER — Other Ambulatory Visit

## 2023-11-21 VITALS — BP 99/60 | HR 67 | Temp 97.9°F | Resp 16 | Ht 62.0 in | Wt 109.1 lb

## 2023-11-21 DIAGNOSIS — Z171 Estrogen receptor negative status [ER-]: Secondary | ICD-10-CM | POA: Diagnosis not present

## 2023-11-21 DIAGNOSIS — Z1731 Human epidermal growth factor receptor 2 positive status: Secondary | ICD-10-CM | POA: Insufficient documentation

## 2023-11-21 DIAGNOSIS — C50212 Malignant neoplasm of upper-inner quadrant of left female breast: Secondary | ICD-10-CM

## 2023-11-21 DIAGNOSIS — Z170 Estrogen receptor positive status [ER+]: Secondary | ICD-10-CM | POA: Insufficient documentation

## 2023-11-21 DIAGNOSIS — C787 Secondary malignant neoplasm of liver and intrahepatic bile duct: Secondary | ICD-10-CM | POA: Insufficient documentation

## 2023-11-21 DIAGNOSIS — Z95828 Presence of other vascular implants and grafts: Secondary | ICD-10-CM

## 2023-11-21 DIAGNOSIS — C7951 Secondary malignant neoplasm of bone: Secondary | ICD-10-CM | POA: Insufficient documentation

## 2023-11-21 DIAGNOSIS — C50912 Malignant neoplasm of unspecified site of left female breast: Secondary | ICD-10-CM

## 2023-11-21 DIAGNOSIS — Z1721 Progesterone receptor positive status: Secondary | ICD-10-CM | POA: Insufficient documentation

## 2023-11-21 DIAGNOSIS — Z79899 Other long term (current) drug therapy: Secondary | ICD-10-CM | POA: Diagnosis not present

## 2023-11-21 DIAGNOSIS — Z5112 Encounter for antineoplastic immunotherapy: Secondary | ICD-10-CM | POA: Insufficient documentation

## 2023-11-21 DIAGNOSIS — C7931 Secondary malignant neoplasm of brain: Secondary | ICD-10-CM | POA: Diagnosis not present

## 2023-11-21 LAB — CMP (CANCER CENTER ONLY)
ALT: 7 U/L (ref 0–44)
AST: 10 U/L — ABNORMAL LOW (ref 15–41)
Albumin: 3.5 g/dL (ref 3.5–5.0)
Alkaline Phosphatase: 94 U/L (ref 38–126)
Anion gap: 5 (ref 5–15)
BUN: 11 mg/dL (ref 6–20)
CO2: 29 mmol/L (ref 22–32)
Calcium: 9.4 mg/dL (ref 8.9–10.3)
Chloride: 104 mmol/L (ref 98–111)
Creatinine: 0.72 mg/dL (ref 0.44–1.00)
GFR, Estimated: >60 mL/min (ref >60)
Glucose, Bld: 79 mg/dL (ref 70–99)
Potassium: 4.3 mmol/L (ref 3.5–5.1)
Sodium: 138 mmol/L (ref 135–145)
Total Bilirubin: 0.2 mg/dL (ref 0.0–1.2)
Total Protein: 7.2 g/dL (ref 6.5–8.1)

## 2023-11-21 LAB — CBC WITH DIFFERENTIAL (CANCER CENTER ONLY)
Abs Immature Granulocytes: 0.03 10*3/uL (ref 0.00–0.07)
Basophils Absolute: 0.1 10*3/uL (ref 0.0–0.1)
Basophils Relative: 1 %
Eosinophils Absolute: 1.1 10*3/uL — ABNORMAL HIGH (ref 0.0–0.5)
Eosinophils Relative: 10 %
HCT: 29.5 % — ABNORMAL LOW (ref 36.0–46.0)
Hemoglobin: 9.4 g/dL — ABNORMAL LOW (ref 12.0–15.0)
Immature Granulocytes: 0 %
Lymphocytes Relative: 18 %
Lymphs Abs: 2 10*3/uL (ref 0.7–4.0)
MCH: 26 pg (ref 26.0–34.0)
MCHC: 31.9 g/dL (ref 30.0–36.0)
MCV: 81.7 fL (ref 80.0–100.0)
Monocytes Absolute: 0.9 10*3/uL (ref 0.1–1.0)
Monocytes Relative: 8 %
Neutro Abs: 6.8 10*3/uL (ref 1.7–7.7)
Neutrophils Relative %: 63 %
Platelet Count: 391 10*3/uL (ref 150–400)
RBC: 3.61 MIL/uL — ABNORMAL LOW (ref 3.87–5.11)
RDW: 17.1 % — ABNORMAL HIGH (ref 11.5–15.5)
WBC Count: 10.9 10*3/uL — ABNORMAL HIGH (ref 4.0–10.5)
nRBC: 0 % (ref 0.0–0.2)

## 2023-11-21 MED ORDER — SODIUM CHLORIDE 0.9% FLUSH: 10.0000 mL | INTRAVENOUS | Status: DC | PRN

## 2023-11-21 MED ORDER — DIPHENHYDRAMINE HCL 25 MG PO CAPS
25.0000 mg | ORAL_CAPSULE | Freq: Once | ORAL | Status: AC
  Administered 2023-11-21: 25 mg via ORAL
  Filled 2023-11-21: qty 1

## 2023-11-21 MED ORDER — ACETAMINOPHEN 325 MG PO TABS
650.0000 mg | ORAL_TABLET | Freq: Once | ORAL | Status: AC
  Administered 2023-11-21: 650 mg via ORAL
  Filled 2023-11-21: qty 2

## 2023-11-21 MED ORDER — SODIUM CHLORIDE 0.9 % IV SOLN
6.0000 mg/kg | Freq: Once | INTRAVENOUS | Status: AC
  Administered 2023-11-21: 300 mg via INTRAVENOUS
  Filled 2023-11-21: qty 14.29

## 2023-11-21 MED ORDER — SODIUM CHLORIDE 0.9 % IV SOLN: Freq: Once | INTRAVENOUS | Status: AC

## 2023-11-21 MED ORDER — SODIUM CHLORIDE 0.9% FLUSH
10.0000 mL | INTRAVENOUS | Status: DC | PRN
  Administered 2023-11-21: 10 mL via INTRAVENOUS

## 2023-11-21 MED ORDER — HEPARIN SOD (PORK) LOCK FLUSH 100 UNIT/ML IV SOLN: 500.0000 [IU] | Freq: Once | INTRAVENOUS | Status: DC | PRN

## 2023-11-21 NOTE — Assessment & Plan Note (Signed)
 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  SRS brain: 08/25/2017-09/04/2017 2. Anti Her 2 therapy with Lapatinib  started 09/17/2017-05/14/2019: Stopped for progression 3.  I discontinued tamoxifen  and started her on letrozole  2.5 mg daily.  05/14/2019 stopped for progression 4.  Stereotactic radiosurgery 12/19/2017 to the new right parietal lobe metastases. 5.  Kadcyla : Received 16 cycles discontinued 05/05/2020 6.  Tucatinib  Xeloda : Discontinued because of hand-foot syndrome 06/11/2020-12/15/2020 7. Herceptin /Neratinib : unable to tolerate due to diarrhea and weight loss 8.  SRS to new brain mets 10/01/2021 9.  Resection of brain metastases at [facility] 12/29/2022: Metastatic breast cancer proc panel pending 10.  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 Mark Sil treated her with Keppra , brain MRI scheduled for 12/14/2023 Coccidioidomycosis : follows with Dr.Van Dam   Return to clinic every 3 weeks for Herceptin  and every 6 weeks of follow-up with me

## 2023-11-21 NOTE — Patient Instructions (Signed)
 [facility] - A DEPT OF [facility]  Discharge Instructions: Thank you for choosing [facility] to provide your oncology and hematology care.   If you have a lab appointment with the Cancer Center, please go directly to the Cancer Center and check in at the registration area.   Wear comfortable clothing and clothing appropriate for easy access to any Portacath or PICC line.   We strive to give you quality time with your provider. You may need to reschedule your appointment if you arrive late (15 or more minutes).  Arriving late affects you and other patients whose appointments are after yours.  Also, if you miss three or more appointments without notifying the office, you may be dismissed from the clinic at the provider's discretion.      For prescription refill requests, have your pharmacy contact our office and allow 72 hours for refills to be completed.    Today you received the following chemotherapy and/or immunotherapy agents: Kanjiniti      To help prevent nausea and vomiting after your treatment, we encourage you to take your nausea medication as directed.  BELOW ARE SYMPTOMS THAT SHOULD BE REPORTED IMMEDIATELY: *FEVER GREATER THAN 100.4 F (38 C) OR HIGHER *CHILLS OR SWEATING *NAUSEA AND VOMITING THAT IS NOT CONTROLLED WITH YOUR NAUSEA MEDICATION *UNUSUAL SHORTNESS OF BREATH *UNUSUAL BRUISING OR BLEEDING *URINARY PROBLEMS (pain or burning when urinating, or frequent urination) *BOWEL PROBLEMS (unusual diarrhea, constipation, pain near the anus) TENDERNESS IN MOUTH AND THROAT WITH OR WITHOUT PRESENCE OF ULCERS (sore throat, sores in mouth, or a toothache) UNUSUAL RASH, SWELLING OR PAIN  UNUSUAL VAGINAL DISCHARGE OR ITCHING   Items with * indicate a potential emergency and should be followed up as soon as possible or go to the Emergency Department if any problems should occur.  Please show the CHEMOTHERAPY ALERT CARD or IMMUNOTHERAPY  ALERT CARD at check-in to the Emergency Department and triage nurse.  Should you have questions after your visit or need to cancel or reschedule your appointment, please contact [facility] - A DEPT OF [facility]  Dept: [phone]  and follow the prompts.  Office hours are 8:00 a.m. to 4:30 p.m. Monday - Friday. Please note that voicemails left after 4:00 p.m. may not be returned until the following business day.  We are closed weekends and major holidays. You have access to a nurse at all times for urgent questions. Please call the main number to the clinic Dept: [phone] and follow the prompts.   For any non-urgent questions, you may also contact your provider using MyChart. We now offer e-Visits for anyone 45 and older to request care online for non-urgent symptoms. For details visit [email or web address].   Also download the MyChart app! Go to the app store, search "MyChart", open the app, select [place], and log in with your MyChart username and password.

## 2023-11-21 NOTE — Progress Notes (Signed)
 Patient Care Team: Tena Feeling, MD as PCP - General (Internal Medicine) Ethlyn Herd, MD as Referring Physician (Emergency Medicine)  DIAGNOSIS:  Encounter Diagnosis  Name Primary?   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 Ki-67 was 70% HER-2 was amplified with a ratio 2.91   07/12/2012 - 07/17/2013 Neo-Adjuvant Chemotherapy   TCH 6 followed by Herceptin  maintenance   08/14/2012 Genetic Testing   Negative genetic testing on the [facility].  The report date is August 14, 2012.  The [facility] gene panel offered by [facility] includes sequencing and deletion/duplication testing of the following 25 genes: APC, ATM, , BARD1, BMPR1A, BRCA1, BRCA2, BRIP1, CHD1, CDK4, CDKN2A, CHEK2, EPCAM (large rearrangement only), MLH1, MSH2, MSH6, MUTYH, NBN, PALB2, PMS2, PTEN, RAD51C, RAD51D, SMAD4, STK11, and TP53.     12/11/2012 Surgery   Left breast lumpectomy: 1.8 cm tumor 1 positive sentinel node, axillary lymph node dissection 02/08/2013 showed 0/13 lymph nodes   03/25/2013 - 05/06/2013 Radiation Therapy   Adjuvant radiation therapy   06/05/2013 - 07/20/2017 Anti-estrogen oral therapy   Tamoxifen  20 mg daily   07/27/2017 Relapse/Recurrence   MRI Brain: 3.4 x 2.9 x 2.9 cm RIGHT frontal lobe mass with imaging characteristics of solitary metastasis. Extensive vasogenic edema resulting in 9 mm RIGHT to LEFT midline shift. Equivocal very early LEFT ventricle entrapment.    08/04/2017 Surgery   Rt frontal brain resection: Poorly differentiated tumor IHC suggests breast primary ER and PR  Positive   08/25/2017 - 09/04/2017 Radiation Therapy   Stereotactic radiation   09/18/2017 -  Anti-estrogen oral therapy   Lapatinib  with letrozole    12/18/2017 - 12/19/2017 Radiation Therapy   New right parietal lobe metastases status post [facility]   08/27/2018 - 08/27/2020 Radiation Therapy   SRS to new brain metastases   05/09/2019 Relapse/Recurrence   Interval increase in size of the enhancing nodular left internal mammary soft tissue 2.8 cm.  Redemonstrated enlarged supraclavicular, lower cervical and lower posterior cervical nodes unchanged.  Interval increase in the bony erosion of the posterior and lateral left third rib, increasing soft tissue lesion eroding the left sternal body 3.1 cm was 2.5 cm.  Bronchiectatic changes   06/19/2019 - 05/05/2020 Chemotherapy   ado-trastuzumab emtansine  (KADCYLA )     06/15/2020 -  Chemotherapy   Xeloda , Tucatinib , Herceptin     04/12/2022 - 09/06/2022 Chemotherapy   Patient is on Treatment Plan : BREAST Trastuzumab  IV (8/6) or SQ (600) D1 q21d     09/27/2022 -  Chemotherapy   Patient is on Treatment Plan : BREAST MAINTENANCE Trastuzumab  IV every 3 weeks (21 days)     11/08/2022 Miscellaneous   Adding low-dose neratinib  (80 mg) to Herceptin    12/20/2022 Cancer Staging   Staging form: Breast, AJCC 7th Edition - Pathologic: Stage IV (M1) - Signed by Percival Brace, NP on 12/20/2022   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  She is currently asymptomatic with no headaches or other symptoms. An MRI is scheduled for June 5th to monitor her condition, with a follow-up appointment on June 10th to review the results. Her seizure disorder remains stable with no episodes, and she continues on a stable dose of Levetiracetam  500 mg orally twice daily.     ALLERGIES:  is allergic to aspirin, protonix  [pantoprazole ], doxycycline , promethazine -codeine , sulfamethoxazole-trimethoprim , and iodinated contrast media.  MEDICATIONS:  Current Outpatient Medications  Medication Sig Dispense Refill   albuterol  (VENTOLIN  HFA) 108 (90 Base) MCG/ACT inhaler Inhale 2 puffs into the lungs every 6 (six) hours as needed for wheezing or shortness of breath (cough). 18 g 5   calcium carbonate (TUMS EX) 750 MG chewable tablet Chew by mouth.     escitalopram (LEXAPRO) 10 MG tablet Take 10 mg by mouth daily.     levalbuterol  (XOPENEX ) 0.63 MG/3ML nebulizer solution Take 3 mLs (0.63 mg total) by nebulization every 4 (four) hours as needed for wheezing or shortness of breath. 90 mL 12   levETIRAcetam  (KEPPRA ) 500 MG tablet Take 250 mg by mouth 2 (two) times daily.     lidocaine -prilocaine  (EMLA ) cream APPLY EXTERNALLY TO THE AFFECTED AREA 1 TIME 30 g 3   naproxen sodium (ALEVE) 220 MG tablet Take by mouth.     SYMBICORT  160-4.5 MCG/ACT inhaler Inhale 2 puffs into the lungs 2 (two) times daily.     acetaminophen  (TYLENOL ) 325 MG tablet Take 2 tablets (650 mg total) by mouth every 6 (six) hours as needed for mild pain (or Fever >/= 101). (Patient not taking: Reported on 10/31/2023) 12 tablet 0   chlorpheniramine -HYDROcodone  (TUSSIONEX) 10-8 MG/5ML Take 5 mLs by mouth 2 (two) times daily. (Patient not taking: Reported on 11/21/2023) 115 mL 0   Multiple Vitamins-Minerals (EMERGEN-C VITAMIN C PO) Take by mouth. (Patient not taking: Reported on 11/06/2023)     ondansetron  (ZOFRAN -ODT) 4 MG disintegrating tablet Take 4 mg by  mouth every 8 (eight) hours as needed. (Patient not taking: Reported on 11/21/2023)     predniSONE  (DELTASONE ) 50 MG tablet Take 1 tablet 13 hrs prior to scan, 2nd tablet 7 hrs prior, 3rd tablet 1 hr prior (Patient not taking: Reported on 11/21/2023) 3 tablet 1   No current facility-administered medications for this visit.   Facility-Administered Medications Ordered in Other Visits  Medication Dose Route Frequency Provider Last Rate Last Admin   alteplase  (CATHFLO ACTIVASE ) injection 2 mg  2 mg Intracatheter Once PRN Cameron Cea, MD       sodium chloride  flush (NS) 0.9 % injection 10 mL  10 mL Intracatheter PRN Cameron Cea, MD   10 mL at 07/06/21 1428    PHYSICAL EXAMINATION: ECOG PERFORMANCE STATUS: 1 - Symptomatic but completely ambulatory  Vitals:   11/21/23 0948  BP: 99/60  Pulse: 67  Resp: 16  Temp: 97.9 F (36.6 C)  SpO2: 99%   Filed Weights   11/21/23 0948  Weight: 109 lb 1.6 oz (49.5 kg)      LABORATORY DATA:  I have reviewed the data as listed    Latest Ref Rng & Units 10/31/2023   10:17  AM 10/10/2023    1:33 PM 03/28/2023   10:51 AM  CMP  Glucose 70 - 99 mg/dL 84  161  84   BUN 6 - 20 mg/dL 8  8  9    Creatinine 0.44 - 1.00 mg/dL 0.96  0.45  4.09   Sodium 135 - 145 mmol/L 137  136  136   Potassium 3.5 - 5.1 mmol/L 4.2  4.3  4.7   Chloride 98 - 111 mmol/L 103  103  101   CO2 22 - 32 mmol/L 30  28  24    Calcium 8.9 - 10.3 mg/dL 9.6  9.6  9.3   Total Protein 6.5 - 8.1 g/dL 7.5  7.3  6.3   Total Bilirubin 0.0 - 1.2 mg/dL 0.2  0.2  0.2   Alkaline Phos 38 - 126 U/L 92  88    AST 15 - 41 U/L 10  8  13    ALT 0 - 44 U/L 6  6  13      Lab Results  Component Value Date   WBC 10.9 (H) 11/21/2023   HGB 9.4 (L) 11/21/2023   HCT 29.5 (L) 11/21/2023   MCV 81.7 11/21/2023   PLT 391 11/21/2023   NEUTROABS 6.8 11/21/2023    ASSESSMENT & PLAN:  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  SRS brain: 08/25/2017-09/04/2017 2. Anti Her 2 therapy with Lapatinib  started 09/17/2017-05/14/2019: Stopped for progression 3.  I discontinued tamoxifen  and started her on letrozole  2.5 mg daily.  05/14/2019 stopped for progression 4.  Stereotactic radiosurgery 12/19/2017 to the new right parietal lobe metastases. 5.  Kadcyla : Received 16 cycles discontinued 05/05/2020 6.  Tucatinib  Xeloda : Discontinued because of hand-foot syndrome 06/11/2020-12/15/2020 7. Herceptin /Neratinib : unable to tolerate due to diarrhea and weight loss 8.  SRS to new brain mets 10/01/2021 9.  Resection of brain metastases at [facility] 12/29/2022: Metastatic breast cancer proc panel pending 10.  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 Follow-up with Doctor Bachelor planned to review results. - Order MRI on  June 5th. - Schedule follow-up with Doctor Bachelor on June 10th.  Travel and activity precautions Advised to move during travel to prevent blood clots. - Advise to move during travel to prevent blood clots.      No orders of the defined types were placed in this encounter.  The patient has a good understanding of the overall plan. she agrees with it. she will call with any problems that may develop before the next visit here. Total time spent: 30 mins including face to face time and time spent for planning, charting and co-ordination of care   Viinay K [employer] Null, MD 11/21/23

## 2023-11-23 ENCOUNTER — Encounter: Payer: Self-pay | Admitting: Internal Medicine

## 2023-12-06 ENCOUNTER — Other Ambulatory Visit: Payer: Self-pay | Admitting: Hematology and Oncology

## 2023-12-06 MED ORDER — HYDROCOD POLI-CHLORPHE POLI ER 10-8 MG/5ML PO SUER: 5.0000 mL | Freq: Two times a day (BID) | ORAL | 0 refills | Status: DC

## 2023-12-09 DIAGNOSIS — C50919 Malignant neoplasm of unspecified site of unspecified female breast: Secondary | ICD-10-CM | POA: Diagnosis not present

## 2023-12-09 DIAGNOSIS — E7800 Pure hypercholesterolemia, unspecified: Secondary | ICD-10-CM | POA: Diagnosis not present

## 2023-12-11 ENCOUNTER — Telehealth: Payer: Self-pay

## 2023-12-11 NOTE — Telephone Encounter (Signed)
 Pt called and states she has been trying to get in touch with scheduling regarding her 6/5 appts. She states she needs to r/s for the following week, as her mother has passed away and 6/5 is her funeral. Attempted to call pt back to extend our condolences and r/s her appt. LVM for call back.

## 2023-12-12 ENCOUNTER — Inpatient Hospital Stay: Admitting: Nutrition

## 2023-12-12 ENCOUNTER — Inpatient Hospital Stay

## 2023-12-12 ENCOUNTER — Ambulatory Visit: Admitting: Internal Medicine

## 2023-12-12 ENCOUNTER — Other Ambulatory Visit

## 2023-12-12 ENCOUNTER — Inpatient Hospital Stay: Admitting: Adult Health

## 2023-12-12 ENCOUNTER — Inpatient Hospital Stay: Admitting: Hematology and Oncology

## 2023-12-13 ENCOUNTER — Ambulatory Visit

## 2023-12-13 ENCOUNTER — Telehealth: Payer: Self-pay

## 2023-12-13 ENCOUNTER — Other Ambulatory Visit

## 2023-12-13 NOTE — Telephone Encounter (Signed)
-----   Message from Nurse Rice Chamorro B sent at 12/13/2023 12:13 PM EDT ----- Regarding: FW: R/S INFUSION LABS  ----- Message ----- From: Kay Parson, LPN Sent: 0/03/8118  10:42 AM EDT To: Terrel Ferries, RN; Sanjuan Crumbly; # Subject: RE: R/S INFUSION LABS                          Spoke with the patient this morning. She said her mom's funeral actually got rescheduled and they had it yesterday. Mary & I spoke and she is going to have tx Friday after her echo. She is going to her MRI tomorrow.  Thanks everyone, Rice Chamorro ----- Message ----- From: Terrel Ferries, RN Sent: 12/12/2023   6:40 PM EDT To: Kay Parson, LPN; Sanjuan Crumbly; [facility] Subject: RE: R/S INFUSION LABS                          Rice Chamorro, She has a scan scheduled on 6/5. Should someone call on her behalf to cancel this?  If Rice Chamorro is out, pod 3 coverage can address tomorrow. Terrel Ferries, RN ----- Message ----- From: Kay Parson, LPN Sent: 07/15/7827   4:02 PM EDT To: Sanjuan Crumbly; [facility] Subject: R/S INFUSION LABS                              Please call patient this [id number], Wednesday OR Friday to r/s her appts that I cancelled for 6/5.  She needs appts next week. Her mom passed away and her funeral is Thursday 6/5. I am sure she doesn't want to hear from us  that day.   Thanks, Rice Chamorro

## 2023-12-14 ENCOUNTER — Inpatient Hospital Stay

## 2023-12-14 ENCOUNTER — Ambulatory Visit
Admission: RE | Admit: 2023-12-14 | Discharge: 2023-12-14 | Disposition: A | Discharge status: Home / Self Care | Source: Ambulatory Visit | Attending: Internal Medicine

## 2023-12-14 ENCOUNTER — Other Ambulatory Visit: Payer: Self-pay | Admitting: Hematology and Oncology

## 2023-12-14 DIAGNOSIS — C801 Malignant (primary) neoplasm, unspecified: Secondary | ICD-10-CM | POA: Diagnosis not present

## 2023-12-14 DIAGNOSIS — Z9889 Other specified postprocedural states: Secondary | ICD-10-CM | POA: Diagnosis not present

## 2023-12-14 DIAGNOSIS — Z171 Estrogen receptor negative status [ER-]: Secondary | ICD-10-CM

## 2023-12-14 DIAGNOSIS — C7931 Secondary malignant neoplasm of brain: Secondary | ICD-10-CM

## 2023-12-14 DIAGNOSIS — C50912 Malignant neoplasm of unspecified site of left female breast: Secondary | ICD-10-CM

## 2023-12-14 MED ORDER — HEPARIN SOD (PORK) LOCK FLUSH 100 UNIT/ML IV SOLN
500.0000 [IU] | Freq: Once | INTRAVENOUS | Status: DC
Start: 2023-12-14 — End: 2023-12-15

## 2023-12-14 MED ORDER — SODIUM CHLORIDE 0.9% FLUSH
10.0000 mL | INTRAVENOUS | Status: DC | PRN
  Administered 2023-12-14: 10 mL via INTRAVENOUS

## 2023-12-14 MED ORDER — GADOPICLENOL 0.5 MMOL/ML IV SOLN
5.0000 mL | Freq: Once | INTRAVENOUS | Status: AC | PRN
  Administered 2023-12-14: 5 mL via INTRAVENOUS

## 2023-12-15 ENCOUNTER — Ambulatory Visit (HOSPITAL_COMMUNITY)
Admission: RE | Admit: 2023-12-15 | Discharge: 2023-12-15 | Disposition: A | Discharge status: Home / Self Care | Source: Ambulatory Visit | Attending: Hematology and Oncology | Admitting: Hematology and Oncology

## 2023-12-15 ENCOUNTER — Inpatient Hospital Stay

## 2023-12-15 DIAGNOSIS — Z171 Estrogen receptor negative status [ER-]: Secondary | ICD-10-CM | POA: Diagnosis not present

## 2023-12-15 DIAGNOSIS — I071 Rheumatic tricuspid insufficiency: Secondary | ICD-10-CM | POA: Insufficient documentation

## 2023-12-15 DIAGNOSIS — C50212 Malignant neoplasm of upper-inner quadrant of left female breast: Secondary | ICD-10-CM | POA: Diagnosis not present

## 2023-12-15 DIAGNOSIS — Z0189 Encounter for other specified special examinations: Secondary | ICD-10-CM | POA: Diagnosis not present

## 2023-12-15 LAB — ECHOCARDIOGRAM COMPLETE
Area-P 1/2: 3.72 cm2
Calc EF: 47.8 %
S' Lateral: 3.6 cm
Single Plane A2C EF: 45.3 %
Single Plane A4C EF: 49.5 %

## 2023-12-18 ENCOUNTER — Inpatient Hospital Stay: Attending: Hematology and Oncology

## 2023-12-18 ENCOUNTER — Encounter

## 2023-12-18 DIAGNOSIS — Z79899 Other long term (current) drug therapy: Secondary | ICD-10-CM | POA: Insufficient documentation

## 2023-12-18 DIAGNOSIS — Z5112 Encounter for antineoplastic immunotherapy: Secondary | ICD-10-CM | POA: Insufficient documentation

## 2023-12-18 DIAGNOSIS — Z1721 Progesterone receptor positive status: Secondary | ICD-10-CM | POA: Insufficient documentation

## 2023-12-18 DIAGNOSIS — Z1732 Human epidermal growth factor receptor 2 negative status: Secondary | ICD-10-CM | POA: Insufficient documentation

## 2023-12-18 DIAGNOSIS — C50212 Malignant neoplasm of upper-inner quadrant of left female breast: Secondary | ICD-10-CM | POA: Insufficient documentation

## 2023-12-18 DIAGNOSIS — C7931 Secondary malignant neoplasm of brain: Secondary | ICD-10-CM | POA: Insufficient documentation

## 2023-12-18 DIAGNOSIS — Z170 Estrogen receptor positive status [ER+]: Secondary | ICD-10-CM | POA: Insufficient documentation

## 2023-12-19 ENCOUNTER — Inpatient Hospital Stay: Admitting: Internal Medicine

## 2023-12-19 ENCOUNTER — Inpatient Hospital Stay

## 2023-12-19 ENCOUNTER — Other Ambulatory Visit: Payer: Self-pay | Admitting: Hematology and Oncology

## 2023-12-19 VITALS — BP 106/61 | HR 73 | Temp 98.0°F | Resp 13 | Wt 107.3 lb

## 2023-12-19 DIAGNOSIS — Z1732 Human epidermal growth factor receptor 2 negative status: Secondary | ICD-10-CM | POA: Diagnosis not present

## 2023-12-19 DIAGNOSIS — Z170 Estrogen receptor positive status [ER+]: Secondary | ICD-10-CM | POA: Diagnosis not present

## 2023-12-19 DIAGNOSIS — C50212 Malignant neoplasm of upper-inner quadrant of left female breast: Secondary | ICD-10-CM | POA: Diagnosis not present

## 2023-12-19 DIAGNOSIS — Z5112 Encounter for antineoplastic immunotherapy: Secondary | ICD-10-CM | POA: Diagnosis not present

## 2023-12-19 DIAGNOSIS — Z171 Estrogen receptor negative status [ER-]: Secondary | ICD-10-CM

## 2023-12-19 DIAGNOSIS — C50912 Malignant neoplasm of unspecified site of left female breast: Secondary | ICD-10-CM

## 2023-12-19 DIAGNOSIS — Z1721 Progesterone receptor positive status: Secondary | ICD-10-CM | POA: Diagnosis not present

## 2023-12-19 DIAGNOSIS — C7931 Secondary malignant neoplasm of brain: Secondary | ICD-10-CM | POA: Diagnosis present

## 2023-12-19 DIAGNOSIS — Z79899 Other long term (current) drug therapy: Secondary | ICD-10-CM | POA: Diagnosis not present

## 2023-12-19 MED ORDER — SODIUM CHLORIDE 0.9 % IV SOLN: Freq: Once | INTRAVENOUS | Status: AC

## 2023-12-19 MED ORDER — DIPHENHYDRAMINE HCL 25 MG PO CAPS
25.0000 mg | ORAL_CAPSULE | Freq: Once | ORAL | Status: AC
  Administered 2023-12-19: 25 mg via ORAL
  Filled 2023-12-19: qty 1

## 2023-12-19 MED ORDER — ACETAMINOPHEN 325 MG PO TABS
650.0000 mg | ORAL_TABLET | Freq: Once | ORAL | Status: AC
  Administered 2023-12-19: 650 mg via ORAL
  Filled 2023-12-19: qty 2

## 2023-12-19 MED ORDER — TRASTUZUMAB-ANNS CHEMO 150 MG IV SOLR
6.0000 mg/kg | Freq: Once | INTRAVENOUS | Status: AC
  Administered 2023-12-19: 300 mg via INTRAVENOUS
  Filled 2023-12-19: qty 14.29

## 2023-12-19 NOTE — Progress Notes (Signed)
 [facility] [address]  [place], [place] [address] [phone]   Interval Evaluation  Date of Service: 12/19/23 Patient Name: Melissa [name] Patient MRN: [id number] Patient DOB: [birth year] Provider: Mamie Searles, MD  Identifying Statement:  Melissa [name] is a 58 y.o. 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 Ki-67 was 70% HER-2 was amplified with a ratio 2.91   07/12/2012 - 07/17/2013 Neo-Adjuvant Chemotherapy   TCH 6 followed by Herceptin  maintenance   08/14/2012 Genetic Testing   Negative genetic testing on the [facility].  The report date is August 14, 2012.  The [facility] gene panel offered by [facility] includes sequencing and deletion/duplication testing of the following 25 genes: APC, ATM, , BARD1, BMPR1A, BRCA1, BRCA2, BRIP1, CHD1, CDK4, CDKN2A, CHEK2, EPCAM (large rearrangement only), MLH1, MSH2, MSH6, MUTYH, NBN, PALB2, PMS2, PTEN, RAD51C, RAD51D, SMAD4, STK11, and TP53.     12/11/2012 Surgery   Left breast lumpectomy: 1.8 cm tumor 1 positive sentinel node, axillary lymph node dissection 02/08/2013 showed 0/13 lymph nodes   03/25/2013 - 05/06/2013 Radiation Therapy   Adjuvant radiation therapy   06/05/2013 - 07/20/2017 Anti-estrogen oral therapy   Tamoxifen  20 mg daily   07/27/2017 Relapse/Recurrence   MRI Brain: 3.4 x 2.9 x 2.9 cm RIGHT frontal lobe mass with imaging characteristics of solitary metastasis. Extensive vasogenic edema resulting in 9 mm RIGHT to LEFT midline shift. Equivocal very early LEFT ventricle entrapment.    08/04/2017 Surgery   Rt frontal brain resection:  Poorly differentiated tumor IHC suggests breast primary ER and PR Positive   08/25/2017 - 09/04/2017 Radiation Therapy   Stereotactic radiation   09/18/2017 -  Anti-estrogen oral therapy   Lapatinib  with letrozole    12/18/2017 - 12/19/2017 Radiation Therapy   New right parietal lobe metastases status post [facility]   08/27/2018 - 08/27/2020 Radiation Therapy   SRS to new brain metastases   05/09/2019 Relapse/Recurrence   Interval increase in size of the enhancing nodular left internal mammary soft tissue 2.8 cm.  Redemonstrated enlarged supraclavicular, lower cervical and lower posterior cervical nodes unchanged.  Interval increase in the bony erosion of the posterior and lateral left third rib, increasing soft tissue lesion eroding the left sternal body 3.1 cm was 2.5 cm.  Bronchiectatic changes   06/19/2019 - 05/05/2020 Chemotherapy   ado-trastuzumab emtansine  (KADCYLA )     06/15/2020 -  Chemotherapy   Xeloda , Tucatinib , Herceptin     04/12/2022 - 09/06/2022 Chemotherapy   Patient is on Treatment Plan : BREAST Trastuzumab  IV (8/6) or SQ (600) D1 q21d     09/27/2022 -  Chemotherapy   Patient is on Treatment Plan : BREAST MAINTENANCE Trastuzumab  IV every 3 weeks (21 days)     11/08/2022 Miscellaneous   Adding low-dose neratinib  (80 mg) to Herceptin    12/20/2022 Cancer Staging   Staging form: Breast, AJCC 7th Edition - Pathologic: Stage IV (M1) - Signed by Percival Brace, NP on 12/20/2022   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  Concurrent avastin  initiated q3  weeks 12/29/22: Craniotomy, resection R temporal (Fecci).  Path is 70% neoplasm 11/04/22: Post-op frx SRS L occipital Lorri Rota) 10/06/22: Craniotomy, resection L occipital at [facility]), path is neoplasm 10/12/21: SRS to pons 16 Gy Lorri Rota) 06/29/21: SRS to 2x L occipital 03/17/21: SRS x3 08/27/20: SRS x5 05/13/20: SRS x4  03/25/19: SRS right frontal 5 mm 09/04/18: SRS x2 right frontal 12/18/17: SRS R parietal 09/04/17: Post op SRS R frontal, 5 fxs 08/04/17: Craniotomy, resection R frontal met ([name])  Interval History: Melissa [name] presents today for follow up after recent MRI brain.  No new or progressive neurologic complaints..  She feels well today, still has right sided visual impairment, left sided facial numbness.  Struggling today with grief over recent unexpected passing of her mother.    Prior: Aaron AasAaron AasNow having completed SRS for recurrent brainstem metastasis and right temporal resection cavity.   She describes several weeks history of "coughing up blood, including large clots".  She has been evaluated by pulmonology, etiology is unclear.  Does report an increase in fatigue and lethargy since symptom onset.  Ohterwise no new or progressive neurologic complaints; she continues to experience numbness and tingling in her left hand since surgery in June.  Walking independently, no seizures or headaches.  Prior: She describes some lingering tingling of fingertips in her left hand, but this has actually improved in recent days.  There are still head pain sensations following surgery, described as "lightning bolt" feeling which lasts for 3-5 seconds, occurs 2-3x per hour.  This has not improved with Tylenol  or other pain medications.  Otherwise no new or progressive neurologic symptoms, remains functionally intact and independent with gait.  Continues on Kanjinti  with Dr. Gudena, tolerating it well.  Planning upcoming trip to beach with new camper.   She describes recurrence of daily headaches  since surgery.  Has been dosing Tylenol  or Aleve daily.  Sleep has been poor at times.  Steroids had been discontinued.  Her right sided visual symptoms have not recurred.  Otherwise no new or progressive changes.  H+P (08/02/22) Patient presents today for headache evaluation.  She describes pain "along the temples" mild to moderate, every day for the past month.  Prior to this she had very sporadic migraine type headaches, which these differ from.  No associated neurologic deficits.  She does dose codeine  based cough syrup every night (for the past year), and has chronic sleep impairment.  Decadron  2mg  twice per day, started last week, has not helped any symptoms.  Also describes some "shadowy visual loss" on the right side, at times.  Continues on herceptin  with Dr. Gudena.  Medications: Current Outpatient Medications on File Prior to Visit  Medication Sig Dispense Refill   acetaminophen  (TYLENOL ) 325 MG tablet Take 2 tablets (650 mg total) by mouth every 6 (six) hours as needed for mild pain (or Fever >/= 101). 12 tablet 0   albuterol  (VENTOLIN  HFA) 108 (90 Base) MCG/ACT inhaler Inhale 2 puffs into the lungs every 6 (six) hours as needed for wheezing or shortness of breath (cough). 18 g 5   calcium carbonate (TUMS EX) 750 MG chewable tablet Chew by mouth.     chlorpheniramine -HYDROcodone  (  TUSSIONEX) 10-8 MG/5ML Take 5 mLs by mouth 2 (two) times daily. 115 mL 0   escitalopram (LEXAPRO) 10 MG tablet Take 10 mg by mouth daily.     levalbuterol  (XOPENEX ) 0.63 MG/3ML nebulizer solution Take 3 mLs (0.63 mg total) by nebulization every 4 (four) hours as needed for wheezing or shortness of breath. 90 mL 12   levETIRAcetam  (KEPPRA ) 500 MG tablet Take 250 mg by mouth 2 (two) times daily.     lidocaine -prilocaine  (EMLA ) cream APPLY EXTERNALLY TO THE AFFECTED AREA 1 TIME 30 g 3   Multiple Vitamins-Minerals (EMERGEN-C VITAMIN C PO) Take by mouth. (Patient not taking: Reported on 11/06/2023)     naproxen sodium  (ALEVE) 220 MG tablet Take by mouth.     ondansetron  (ZOFRAN -ODT) 4 MG disintegrating tablet Take 4 mg by mouth every 8 (eight) hours as needed. (Patient not taking: Reported on 09/19/2023)     predniSONE  (DELTASONE ) 50 MG tablet Take 1 tablet 13 hrs prior to scan, 2nd tablet 7 hrs prior, 3rd tablet 1 hr prior (Patient not taking: Reported on 12/19/2023) 3 tablet 1   SYMBICORT  160-4.5 MCG/ACT inhaler Inhale 2 puffs into the lungs 2 (two) times daily.     Current Facility-Administered Medications on File Prior to Visit  Medication Dose Route Frequency Provider Last Rate Last Admin   alteplase  (CATHFLO ACTIVASE ) injection 2 mg  2 mg Intracatheter Once PRN Cameron Cea, MD       sodium chloride  flush (NS) 0.9 % injection 10 mL  10 mL Intracatheter PRN Cameron Cea, MD   10 mL at 07/06/21 1428    Allergies:  Allergies  Allergen Reactions   Aspirin Anaphylaxis and Shortness Of Breath    THROAT CLOSES   Protonix  [Pantoprazole ] Nausea Only and Other (See Comments)    Also caused a "film in the mouth" and caused chest pressure   Doxycycline  Other (See Comments)    Hallucinations, headaches   Promethazine -Codeine  Cough    Worsening cough and insomnia    Sulfamethoxazole-Trimethoprim  Other (See Comments)   Iodinated Contrast Media Rash    Patient allergic to contrast used in radiation oncology for CT simulation    Past Medical History:  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)    2019   Cancer, metastatic to liver (HCC)    2021   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)    Coccidioidomycosis 11/05/2023   Complication of anesthesia    bp dropped + desat    COPD (chronic obstructive pulmonary disease) (HCC)    Dyspnea    DOE   GERD (gastroesophageal  reflux disease)    H/O coccidioidomycosis    was reason for lung lobectomy   Headache(784.0)    due to eye strain or not eating   Hemoptysis 05/08/2023   History of anemia    no current problem   History of asthma    as a child   History of breast cancer 2014   left   History of chemotherapy    finished 07/17/2013   History of hiatal hernia    AGE 8   Hx of radiation therapy 03/25/13-05/06/13   left breast 5000 cGy/25 sessions, left  breast boost 1000 cGy/5 sessions   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   Personal history of chemotherapy    for liver cancer   Personal history of radiation therapy    Pneumonia    LAST FLARE UP 01/2018   Rash 02/22/2021   Runny nose 07/30/2013   clear drainage   Smoker 05/24/2021   Wears dentures    upper   Past Surgical History:  Past Surgical History:  Procedure Laterality Date   APPLICATION OF CRANIAL NAVIGATION N/A 08/04/2017   Procedure: APPLICATION OF CRANIAL NAVIGATION;  Surgeon: [name], Raelene Bullocks, MD;  Location: [facility] OR;  Service: Neurosurgery;  Laterality: N/A;   AXILLARY LYMPH NODE DISSECTION Left 02/08/2013   Procedure: LEFT AXILLARY DISSECTION;  Surgeon: Quitman Bucy, MD;  Location: [facility];  Service: General;  Laterality: Left;   BREAST CYST EXCISION Right 2006   BREAST LUMPECTOMY Left 2014   BREAST LUMPECTOMY WITH NEEDLE LOCALIZATION AND AXILLARY SENTINEL LYMPH NODE BX Left 12/31/2012   Procedure: NEEDLE LOCALIZATION LEFT BREAST LUMPECTOMY AND LEFT AXILLARY SENTENIAL LYMPH NODE BX;  Surgeon: Quitman Bucy, MD;  Location: [facility];  Service: General;  Laterality: Left;   BRONCHIAL BRUSHINGS  03/17/2020   Procedure: BRONCHIAL BRUSHINGS;  Surgeon: Prudy Brownie, DO;  Location: [facility];  Service: Pulmonary;;   BRONCHIAL WASHINGS  03/17/2020   Procedure: BRONCHIAL WASHINGS;  Surgeon: Prudy Brownie, DO;  Location: [facility];  Service: Pulmonary;;   CESAREAN SECTION  1995/1996   CRANIOTOMY Right  08/04/2017   Procedure: Right Frontal craniotomy for resection of tumor with stereotactic navigation;  Surgeon: [name], Raelene Bullocks, MD;  Location: [facility] OR;  Service: Neurosurgery;  Laterality: Right;  Right Frontal craniotomy for resection of tumor with stereotactic navigation   IR CV LINE INJECTION  02/19/2021   IR CV LINE INJECTION  11/12/2021   IR IMAGING GUIDED PORT INSERTION  05/31/2019   LUNG LOBECTOMY Left 05/1996   upper lobe - due to [facility] Fever   PORT-A-CATH REMOVAL Right 08/02/2013   Procedure: REMOVAL PORT-A-CATH;  Surgeon: Quitman Bucy, MD;  Location: [facility];  Service: General;  Laterality: Right;   PORTACATH PLACEMENT  07/02/2012   Procedure: INSERTION PORT-A-CATH;  Surgeon: Quitman Bucy, MD;  Location:  SURGERY CENTER;  Service: General;  Laterality: N/A;  right   VIDEO BRONCHOSCOPY N/A 03/17/2020   Procedure: VIDEO BRONCHOSCOPY;  Surgeon: Prudy Brownie, DO;  Location: [facility];  Service: Pulmonary;  Laterality: N/A;   VIDEO BRONCHOSCOPY WITH ENDOBRONCHIAL ULTRASOUND N/A 05/23/2018   Procedure: VIDEO BRONCHOSCOPY WITH ENDOBRONCHIAL ULTRASOUND;  Surgeon: Prudy Brownie, DO;  Location: [facility] OR;  Service: Thoracic;  Laterality: N/A;   Social History:  Social History   Socioeconomic History   Marital status: Married    Spouse name: Not on file   Number of children: 2   Years of education: Not on file   Highest education level: Not on file  Occupational History   Occupation: Investment banker, corporate  Tobacco Use   Smoking status: Every Day    Current packs/day: 0.30    Average packs/day: 0.3 packs/day for 38.0 years (11.4 ttl pk-yrs)    Types: Cigarettes   Smokeless tobacco: Never   Tobacco comments:    12 cigarettes a day-05/08/23  Vaping Use   Vaping status: Never Used  Substance and Sexual Activity   Alcohol use: No   Drug use: No   Sexual activity: Not Currently  Other Topics Concern  Not on file  Social History Narrative    Not on file   Social Drivers of Health   Financial Resource Strain: Low Risk  (01/03/2023)   Received from [facility], [facility]   Overall Financial Resource Strain (CARDIA)    Difficulty of Paying Living Expenses: Not hard at all  Food Insecurity: No Food Insecurity (01/30/2023)   Hunger Vital Sign    Worried About Running Out of Food in the Last Year: Never true    Ran Out of Food in the Last Year: Never true  Transportation Needs: No Transportation Needs (01/30/2023)   PRAPARE - Administrator, Civil Service (Medical): No    Lack of Transportation (Non-Medical): No  Physical Activity: Not on file  Stress: Not on file  Social Connections: Not on file  Intimate Partner Violence: Not At Risk (01/30/2023)   Humiliation, Afraid, Rape, and Kick questionnaire    Fear of Current or Ex-Partner: No    Emotionally Abused: No    Physically Abused: No    Sexually Abused: No   Family History:  Family History  Problem Relation Age of Onset   Emphysema Mother        was a smoker   Heart disease Mother    Melanoma Mother        dx in her 24s   Breast cancer Mother 75   Breast cancer Cousin 56 - 55       mother's maternal cousin   Asthma Brother     Review of Systems: Constitutional: Doesn't report fevers, chills or abnormal weight loss Eyes: Doesn't report blurriness of vision Ears, nose, mouth, throat, and face: Doesn't report sore throat Respiratory: Doesn't report cough, dyspnea or wheezes Cardiovascular: Doesn't report palpitation, chest discomfort  Gastrointestinal:  Doesn't report nausea, constipation, diarrhea GU: Doesn't report incontinence Skin: Doesn't report skin rashes Neurological: Per HPI Musculoskeletal: Doesn't report joint pain Behavioral/Psych: Doesn't report anxiety  Physical Exam: Vitals:   12/19/23 0929  BP: 106/61  Pulse: 73  Resp: 13  Temp: 98 F (36.7 C)  SpO2: 97%    KPS: 80. General: Alert,  cooperative, pleasant, in no acute distress Head: Normal EENT: No conjunctival injection or scleral icterus.  Lungs: Resp effort normal Cardiac: Regular rate Abdomen: Non-distended abdomen Skin: No rashes cyanosis or petechiae. Extremities: No clubbing or edema  Neurologic Exam: Mental Status: Awake, alert, attentive to examiner. Oriented to self and environment. Language is fluent with intact comprehension.  Cranial Nerves: Visual acuity is grossly normal. Visual fields are full. Extra-ocular movements intact. No ptosis. Face is symmetric Motor: Tone and bulk are normal. Power is full in both arms and legs. Reflexes are symmetric, no pathologic reflexes present.  Sensory: Intact to light touch Gait: Normal.   Labs: I have reviewed the data as listed    Component Value Date/Time   NA 138 11/21/2023 0931   NA 142 08/12/2014 0956   K 4.3 11/21/2023 0931   K 4.6 08/12/2014 0956   CL 104 11/21/2023 0931   CL 100 12/20/2012 1509   CO2 29 11/21/2023 0931   CO2 26 08/12/2014 0956   GLUCOSE 79 11/21/2023 0931   GLUCOSE 94 08/12/2014 0956   GLUCOSE 98 12/20/2012 1509   BUN 11 11/21/2023 0931   BUN 10.1 08/12/2014 0956   CREATININE 0.72 11/21/2023 0931   CREATININE 0.83 03/28/2023 1051   CREATININE 0.9 08/12/2014 0956   CALCIUM 9.4 11/21/2023 0931   CALCIUM 9.3 08/12/2014  0956   PROT 7.2 11/21/2023 0931   PROT 7.1 08/12/2014 0956   ALBUMIN 3.5 11/21/2023 0931   ALBUMIN 3.8 08/12/2014 0956   AST 10 (L) 11/21/2023 0931   AST 15 08/12/2014 0956   ALT 7 11/21/2023 0931   ALT 11 08/12/2014 0956   ALKPHOS 94 11/21/2023 0931   ALKPHOS 68 08/12/2014 0956   BILITOT 0.2 11/21/2023 0931   BILITOT 0.25 08/12/2014 0956   GFRNONAA >60 11/21/2023 0931   GFRNONAA 76 03/11/2019 1057   GFRAA >60 03/24/2020 1012   GFRAA >60 02/11/2020 0836   GFRAA 88 03/11/2019 1057   Lab Results  Component Value Date   WBC 10.9 (H) 11/21/2023   NEUTROABS 6.8 11/21/2023   HGB 9.4 (L) 11/21/2023   HCT  29.5 (L) 11/21/2023   MCV 81.7 11/21/2023   PLT 391 11/21/2023    Imaging:  ECHOCARDIOGRAM COMPLETE Result Date: 12/15/2023    ECHOCARDIOGRAM REPORT   Patient Name:   Melissa [name] [facility] Date of Exam: 12/15/2023 Medical Rec #:  [id number]     Height:       62.0 in Accession #:    [id number]    Weight:       109.1 lb Date of Birth:  26-Apr-1966     BSA:          1.478 m Patient Age:    57 years      BP:           109/74 mmHg Patient Gender: F             HR:           77 bpm. Exam Location:  Outpatient Procedure: 2D Echo, Cardiac Doppler, Color Doppler and 3D Echo (Both Spectral            and Color Flow Doppler were utilized during procedure). Indications:    Chemo Z09  History:        Patient has prior history of Echocardiogram examinations, most                 recent 06/21/2023.  Sonographer:    Hersey Lorenzo RDCS Referring Phys: [id number] Cameron Cea IMPRESSIONS  1. Left ventricular ejection fraction, by estimation, is 55 to 60%. The left ventricle has normal function. The left ventricle has no regional wall motion abnormalities. Left ventricular diastolic parameters were normal.  2. Right ventricular systolic function is normal. The right ventricular size is normal.  3. The mitral valve is normal in structure. No evidence of mitral valve regurgitation. No evidence of mitral stenosis.  4. The aortic valve is tricuspid. Aortic valve regurgitation is not visualized. No aortic stenosis is present.  5. The inferior vena cava is normal in size with greater than 50% respiratory variability, suggesting right atrial pressure of 3 mmHg. FINDINGS  Left Ventricle: Left ventricular ejection fraction, by estimation, is 55 to 60%. The left ventricle has normal function. The left ventricle has no regional wall motion abnormalities. The left ventricular internal cavity size was normal in size. There is  no left ventricular hypertrophy. Left ventricular diastolic parameters were normal. Right Ventricle: The right ventricular size is  normal. No increase in right ventricular wall thickness. Right ventricular systolic function is normal. Left Atrium: Left atrial size was normal in size. Right Atrium: Right atrial size was normal in size. Pericardium: There is no evidence of pericardial effusion. Mitral Valve: The mitral valve is normal in structure. No evidence of mitral valve regurgitation. No evidence of  mitral valve stenosis. Tricuspid Valve: The tricuspid valve is normal in structure. Tricuspid valve regurgitation is mild . No evidence of tricuspid stenosis. Aortic Valve: The aortic valve is tricuspid. Aortic valve regurgitation is not visualized. No aortic stenosis is present. Pulmonic Valve: The pulmonic valve was normal in structure. Pulmonic valve regurgitation is not visualized. No evidence of pulmonic stenosis. Aorta: The aortic root is normal in size and structure. Pulmonary Artery: The pulmonary artery is of normal size. Venous: The inferior vena cava is normal in size with greater than 50% respiratory variability, suggesting right atrial pressure of 3 mmHg. IAS/Shunts: No atrial level shunt detected by color flow Doppler.  LEFT VENTRICLE PLAX 2D LVIDd:         4.50 cm     Diastology LVIDs:         3.60 cm     LV e' medial:    4.46 cm/s LV PW:         0.90 cm     LV E/e' medial:  10.5 LV IVS:        0.90 cm     LV e' lateral:   6.64 cm/s LVOT diam:     2.00 cm     LV E/e' lateral: 7.0 LV SV:         65 LV SV Index:   44 LVOT Area:     3.14 cm  LV Volumes (MOD) LV vol d, MOD A2C: 52.3 ml LV vol d, MOD A4C: 55.0 ml LV vol s, MOD A2C: 28.6 ml LV vol s, MOD A4C: 27.8 ml LV SV MOD A2C:     23.7 ml LV SV MOD A4C:     55.0 ml LV SV MOD BP:      26.3 ml RIGHT VENTRICLE             IVC RV S prime:     10.80 cm/s  IVC diam: 1.70 cm TAPSE (M-mode): 1.3 cm LEFT ATRIUM             Index        RIGHT ATRIUM          Index LA diam:        2.70 cm 1.83 cm/m   RA Area:     7.73 cm LA Vol (A2C):   36.8 ml 24.91 ml/m  RA Volume:   13.20 ml 8.93 ml/m  LA Vol (A4C):   23.5 ml 15.90 ml/m LA Biplane Vol: 29.5 ml 19.96 ml/m  AORTIC VALVE LVOT Vmax:   112.00 cm/s LVOT Vmean:  79.300 cm/s LVOT VTI:    0.207 m  AORTA Ao Root diam: 3.10 cm Ao Asc diam:  3.10 cm MITRAL VALVE MV Area (PHT): 3.72 cm    SHUNTS MV Decel Time: 204 msec    Systemic VTI:  0.21 m MV E velocity: 46.70 cm/s  Systemic Diam: 2.00 cm MV A velocity: 73.70 cm/s MV E/A ratio:  0.63 Arta Lark Electronically signed by Arta Lark Signature Date/Time: 12/15/2023/4:29:52 PM    Final    MR BRAIN W WO CONTRAST Result Date: 12/14/2023 CLINICAL DATA:  Provided history: Metastasis to brain. Brain/CNS neoplasm, assess treatment response. EXAM: MRI HEAD WITHOUT AND WITH CONTRAST TECHNIQUE: Multiplanar, multiecho pulse sequences of the brain and surrounding structures were obtained without and with intravenous contrast. CONTRAST:  5 mL Vueway  intravenous contrast. COMPARISON:  Prior brain MRI examinations 09/14/2023 and earlier. FINDINGS: Brain: No age-advanced or lobar predominant cerebral atrophy. Continued mild increase in extent  of enhancement at site of a lesion within the posteromedial right frontal lobe, particularly at the posteroinferior aspect of the lesion (for instance comparing series 16, image 18 of the current examination with series 17, image 19 of the prior examination). Surrounding T2 FLAIR hyperintense parenchymal signal abnormality is similar. Postoperative changes from prior right frontal craniotomy. Irregular enhancement along and adjacent to the underlying anterior right frontal lobe resection cavity has not significantly changed. Surrounding T2 FLAIR hyperintense parenchymal signal abnormality is similar. Postoperative changes from prior left parietooccipital craniotomy. Mild enhancement surrounding the underlying left parietooccipital resection cavity, not significantly changed. Surrounding T2 FLAIR hyperintense parenchymal signal abnormality is similar. Punctate focus of  enhancement within the left parietal lobe not appreciated on recent prior exams. This is indeterminate for vascular enhancement versus a new metastasis (series 13, image 117). Continued mild increase in size of an enhancing lesion within the medial left temporal lobe, now measuring 7 mm (previously 5 mm) (series 13, image 70). Immediately adjacent, new 3 mm ill-defined focus of enhancement within the left temporal lobe (series 13, image 69). Interval increase in bulk of enhancement within/along the anterior right temporal lobe. For instance, there is a nodular focus of enhancement along the inferior right temporal lobe which now measures 1.5 x 1.0 cm on the coronal post-contrast sequence (series 15, image 21) (previously 1.5 x 0.8 cm). Additionally, there is a nodular focus of enhancement along the medial aspect of the left temporal lobe which now measures 2.1 x 1.3 cm in transaxial dimensions (series 13, image 65) (previously 1.7 x 0.6 cm). Progressive edema also present within the anterior right temporal lobe. Unchanged punctate enhancing lesion within the right occipital lobe (series 13, image 92). An 11 mm enhancing lesion within the central pons has not significantly changed. Surrounding edema is similar. Unchanged 9 mm enhancing lesion within the cerebellar vermis. Unchanged punctate enhancing lesion within the left cerebellar hemisphere (series 13, image 42). There is no acute infarct. No extra-axial fluid collection. No midline shift. Vascular: Maintained flow voids within the proximal large arterial vessels. Skull and upper cervical spine: No focal worrisome lesion. Right frontal and left parietooccipital cranioplasties. Sinuses/Orbits: No mass or acute finding within the imaged orbits. Prior right ocular lens replacement. No significant paranasal sinus disease. Impressions #1, #2, #3 and #4 will be called to the ordering clinician or representative by the Radiologist Assistant, and communication documented  in the PACS or [employer]. IMPRESSION: 1. Interval increase in bulk of enhancement within/along the anterior right temporal lobe, likely reflecting progressive disease. Progressive edema also present within the anterior right temporal lobe. 2. Continued increase in extent of enhancement at site of a lesion within the posteromedial right frontal lobe concerning for progressive disease. 3. Continued increase in size of an enhancing lesion within the medial left temporal lobe, now 7 mm (previously 5 mm). This is concerning for progressive disease. Immediately adjacent, new 3 mm ill-defined focus of left temporal lobe enhancement which is indeterminate for vascular enhancement versus a new metastasis. Short-interval MRI follow-up recommended. 4. Punctate focus of enhancement within the left parietal lobe not appreciated on recent prior exams. This is indeterminate for vascular enhancement versus a new metastasis. Short-interval MRI follow-up recommended. 5. The remaining intracranial metastases have remained stable since 09/14/2023. Electronically Signed   By: Bascom Lily D.O.   On: 12/14/2023 16:15    [facility] Clinician Interpretation: I have personally reviewed the radiological images as listed.  My interpretation, in the context of the patient's clinical  presentation, is progressive disease   Assessment/Plan Metastasis to brain [facility])  Melissa [name] is clinically stable today from focal neurologic standpiont.  MRI brain unfortunately demonstrates frank progression within previously resected and (twice) irradiated anterior right temporal lobe. Recurrence is suspected mixed tumor recurrence and radiation necrosis given pattern on imaging, clinical history and prior histologic sample following craniotomy 1 year ago.  She deferred surgical intervention, biopsy.  In lieu of that, we will recommend proceeding with salvage external beam radiation rather than SRS.  This will be administered over 2-3 weeks,  likely base of skull coverage rather than whole brain.  This is based on discussion in CNS tumor board with radiation oncology team.  Because of very high risk of radiation necrosis, recommended administering concurrent with avastin  7.5mg /kg IV q3 weeks.  This can be given along with her current Kanjinti  treatment plan, also q3 weeks.  We will reach out to Dr. Gudena for further guidance and implementation of this treatment plan.    Next MRI can be ~2 months after completing conventional radiation course.  We appreciate the opportunity to participate in the care of Melissa [name].   We ask that Melissa [name] return to clinic in  following next MRI brain, or sooner if needed.  All questions were answered. The patient knows to call the clinic with any problems, questions or concerns. No barriers to learning were detected.  The total time spent in the encounter was 40 minutes and more than 50% was on counseling and review of test results   Mamie Searles, MD Medical Director of Neuro-Oncology [facility] 12/19/23 9:21 AM

## 2023-12-20 ENCOUNTER — Ambulatory Visit
Admission: RE | Admit: 2023-12-20 | Discharge: 2023-12-20 | Disposition: A | Discharge status: Home / Self Care | Source: Ambulatory Visit | Attending: Radiation Oncology | Admitting: Radiation Oncology

## 2023-12-20 ENCOUNTER — Encounter: Payer: Self-pay | Admitting: Radiation Oncology

## 2023-12-20 DIAGNOSIS — C7931 Secondary malignant neoplasm of brain: Secondary | ICD-10-CM | POA: Diagnosis not present

## 2023-12-20 DIAGNOSIS — C50212 Malignant neoplasm of upper-inner quadrant of left female breast: Secondary | ICD-10-CM

## 2023-12-20 DIAGNOSIS — Z170 Estrogen receptor positive status [ER+]: Secondary | ICD-10-CM | POA: Diagnosis not present

## 2023-12-20 NOTE — Progress Notes (Signed)
 Location/Histology of Brain Tumor: Right Temporal Lobe  Patient presented with symptoms of:    Past or anticipated interventions, if any, per neurosurgery: yes,  NICO CRANIECTOMY, TREPHINATION, BONE FLAP CRANIOTOMY; FOR EXCISION OF BRAIN TUMOR, SUPRATENTORIAL, EXCEPT MENINGIOMA   Past or anticipated interventions, if any, per medical oncology:    Dose of Decadron , if applicable: no  Recent neurologic symptoms, if any:  Seizures: None since March 2025. Patient is currently taking Keppra . Headaches: Reports having minor headaches every 2-3 days.  Nausea: no Dizziness/ataxia: yes, dizziness with position changes. Difficulty with hand coordination: no Focal numbness/weakness: no Visual deficits/changes: remains at baseline.  Confusion/Memory deficits: Yes, short term memory loss that is chronic.   Painful bone metastases at present, if any:   SAFETY ISSUES: Prior radiation? yes Pacemaker/ICD? no Possible current pregnancy? no Is the patient on methotrexate? no  Additional Complaints / other details:

## 2023-12-20 NOTE — Progress Notes (Signed)
 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 Secondary cancer of brain (HCC)  C79.31       2. 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 [facility])  C79.31         Interval Since Last Radiation:  10 months  02-10-23 to 02-20-23 The Right Temporal 40 mm post-op cavity and [place] 13 mm lesion were treated using 5 Rapid Arc VMAT Beams to a prescription dose of 30 and 25 Gy in 5 total fractions    10/28/22 - 11/08/22: The left occipital 26 mm lesion was treated with 30 Gy in 5 fractions    10/18/2022: The right parietal 3 mm brain lesion was treated to a prescription dose of 20 Gy in 1 fraction    10/12/21: The 3 mm target in the right pons was treated to a prescription dose of 20 G in a single fraction.  06/29/21: These two targets were treated to a prescription dose of 20 G in a single fraction.    03/17/21: These three targets were treated to a prescription dose of 20 G in a single fraction.    08/27/20: These five targets were treated to a prescription dose of 20 G in a single fraction.    05/13/20:  SRS brain to the following 4 targets treated to a prescription dose of 20 Gy in a single fraction with a single isocenter:  PTV6 Mid Cerebellum 3mm PTV7 Lt Occipital 4mm PTV8 Lt Occipital 2mm PTV9 Lt Temporal 2mm    03/25/2019:  SRS brain//PTV 5:  Right frontal 5 mm target was treated to a prescription dose of 20 Gy in a single fraction.   09/04/2018:   SRS Brain// Right Frontal, 2 targets / 20 Gy in 1 fraction PTV3: Ant Rt Frontal 11mm 20Gy PTV4: Rt Frontal resection cavity 5mm  20Gy   12/18/2017: SRS brain//PTV2: 4 mm Rt Parietal lesion  treated to 20 Gy in 1 Fx   08/25/2017, 08/28/2017, 08/30/2017, 09/01/2017, 09/04/2017: PTV1: post op SRS to right frontal lobe resection cavity in 5 fxs   Interval History :Melissa [name] is a 58 y.o. female who returns today for follow-up following resection of right temporal lesion, measuring 5.2 mm at the site of previous SRS/metastasis now with a nearby recurrence measuring 15 mm. She returns today to discuss radiation to the new site.                       ALLERGIES:  is allergic to aspirin, protonix  [pantoprazole ], doxycycline , promethazine -codeine , sulfamethoxazole-trimethoprim , and iodinated contrast media.  Meds: Current Outpatient Medications  Medication Sig Dispense Refill   acetaminophen  (TYLENOL ) 325 MG tablet Take 2 tablets (650 mg total) by mouth every 6 (six) hours as needed for mild pain (or Fever >/= 101). 12 tablet 0   albuterol  (VENTOLIN  HFA) 108 (90 Base) MCG/ACT inhaler Inhale 2 puffs into the lungs every 6 (six) hours as needed for wheezing or shortness of breath (cough). 18 g 5   calcium carbonate (TUMS EX) 750 MG chewable tablet Chew by mouth.     chlorpheniramine -HYDROcodone  (TUSSIONEX)  10-8 MG/5ML Take 5 mLs by mouth 2 (two) times daily. 115 mL 0   escitalopram (LEXAPRO) 10 MG tablet Take 10 mg by mouth daily.     levalbuterol  (XOPENEX ) 0.63 MG/3ML nebulizer solution Take 3 mLs (0.63 mg total) by nebulization every 4 (four) hours as needed for wheezing or shortness of breath. 90 mL 12   levETIRAcetam  (KEPPRA ) 500 MG tablet Take 250 mg by mouth 2 (two) times daily.     lidocaine -prilocaine  (EMLA ) cream APPLY EXTERNALLY TO THE AFFECTED AREA 1 TIME 30 g 3   Multiple Vitamins-Minerals (EMERGEN-C VITAMIN C PO) Take by mouth. (Patient not taking: Reported on 11/06/2023)     naproxen sodium (ALEVE) 220 MG tablet Take by mouth.     ondansetron  (ZOFRAN -ODT) 4 MG disintegrating tablet Take 4 mg by mouth every 8 (eight) hours as needed. (Patient not taking: Reported on 09/19/2023)      predniSONE  (DELTASONE ) 50 MG tablet Take 1 tablet 13 hrs prior to scan, 2nd tablet 7 hrs prior, 3rd tablet 1 hr prior (Patient not taking: Reported on 12/19/2023) 3 tablet 1   SYMBICORT  160-4.5 MCG/ACT inhaler Inhale 2 puffs into the lungs 2 (two) times daily.     No current facility-administered medications for this encounter.   Facility-Administered Medications Ordered in Other Encounters  Medication Dose Route Frequency Provider Last Rate Last Admin   alteplase  (CATHFLO ACTIVASE ) injection 2 mg  2 mg Intracatheter Once PRN Cameron Cea, MD       sodium chloride  flush (NS) 0.9 % injection 10 mL  10 mL Intracatheter PRN Cameron Cea, MD   10 mL at 07/06/21 1428    Physical Findings: The patient is in no acute distress. Patient is alert and oriented.  vitals were not taken for this visit. .  No significant changes.  Lab Findings: Lab Results  Component Value Date   WBC 10.9 (H) 11/21/2023   WBC 11.4 (H) 03/28/2023   HGB 9.4 (L) 11/21/2023   HGB 13.1 08/12/2014   HCT 29.5 (L) 11/21/2023   HCT 40.4 08/12/2014   PLT 391 11/21/2023   PLT 228 08/12/2014    Lab Results  Component Value Date   NA 138 11/21/2023   NA 142 08/12/2014   K 4.3 11/21/2023   K 4.6 08/12/2014   CHLORIDE 107 08/12/2014   CO2 29 11/21/2023   CO2 26 08/12/2014   GLUCOSE 79 11/21/2023   GLUCOSE 94 08/12/2014   GLUCOSE 98 12/20/2012   BUN 11 11/21/2023   BUN 10.1 08/12/2014   CREATININE 0.72 11/21/2023   CREATININE 0.83 03/28/2023   CREATININE 0.9 08/12/2014   BILITOT 0.2 11/21/2023   BILITOT 0.25 08/12/2014   ALKPHOS 94 11/21/2023   ALKPHOS 68 08/12/2014   AST 10 (L) 11/21/2023   AST 15 08/12/2014   ALT 7 11/21/2023   ALT 11 08/12/2014   PROT 7.2 11/21/2023   PROT 7.1 08/12/2014   ALBUMIN 3.5 11/21/2023   ALBUMIN 3.8 08/12/2014   CALCIUM 9.4 11/21/2023   CALCIUM 9.3 08/12/2014   ANIONGAP 5 11/21/2023    Radiographic Findings: ECHOCARDIOGRAM COMPLETE Result Date: 12/15/2023     ECHOCARDIOGRAM REPORT   Patient Name:   Melissa [name] [facility] Date of Exam: 12/15/2023 Medical Rec #:  [id number]     Height:       62.0 in Accession #:    [id number]    Weight:       109.1 lb Date of Birth:  05-31-1966     BSA:  1.478 m Patient Age:    57 years      BP:           109/74 mmHg Patient Gender: F             HR:           77 bpm. Exam Location:  Outpatient Procedure: 2D Echo, Cardiac Doppler, Color Doppler and 3D Echo (Both Spectral            and Color Flow Doppler were utilized during procedure). Indications:    Chemo Z09  History:        Patient has prior history of Echocardiogram examinations, most                 recent 06/21/2023.  Sonographer:    Hersey Lorenzo RDCS Referring Phys: [id number] Cameron Cea IMPRESSIONS  1. Left ventricular ejection fraction, by estimation, is 55 to 60%. The left ventricle has normal function. The left ventricle has no regional wall motion abnormalities. Left ventricular diastolic parameters were normal.  2. Right ventricular systolic function is normal. The right ventricular size is normal.  3. The mitral valve is normal in structure. No evidence of mitral valve regurgitation. No evidence of mitral stenosis.  4. The aortic valve is tricuspid. Aortic valve regurgitation is not visualized. No aortic stenosis is present.  5. The inferior vena cava is normal in size with greater than 50% respiratory variability, suggesting right atrial pressure of 3 mmHg. FINDINGS  Left Ventricle: Left ventricular ejection fraction, by estimation, is 55 to 60%. The left ventricle has normal function. The left ventricle has no regional wall motion abnormalities. The left ventricular internal cavity size was normal in size. There is  no left ventricular hypertrophy. Left ventricular diastolic parameters were normal. Right Ventricle: The right ventricular size is normal. No increase in right ventricular wall thickness. Right ventricular systolic function is normal. Left Atrium: Left atrial size  was normal in size. Right Atrium: Right atrial size was normal in size. Pericardium: There is no evidence of pericardial effusion. Mitral Valve: The mitral valve is normal in structure. No evidence of mitral valve regurgitation. No evidence of mitral valve stenosis. Tricuspid Valve: The tricuspid valve is normal in structure. Tricuspid valve regurgitation is mild . No evidence of tricuspid stenosis. Aortic Valve: The aortic valve is tricuspid. Aortic valve regurgitation is not visualized. No aortic stenosis is present. Pulmonic Valve: The pulmonic valve was normal in structure. Pulmonic valve regurgitation is not visualized. No evidence of pulmonic stenosis. Aorta: The aortic root is normal in size and structure. Pulmonary Artery: The pulmonary artery is of normal size. Venous: The inferior vena cava is normal in size with greater than 50% respiratory variability, suggesting right atrial pressure of 3 mmHg. IAS/Shunts: No atrial level shunt detected by color flow Doppler.  LEFT VENTRICLE PLAX 2D LVIDd:         4.50 cm     Diastology LVIDs:         3.60 cm     LV e' medial:    4.46 cm/s LV PW:         0.90 cm     LV E/e' medial:  10.5 LV IVS:        0.90 cm     LV e' lateral:   6.64 cm/s LVOT diam:     2.00 cm     LV E/e' lateral: 7.0 LV SV:         65 LV SV Index:  44 LVOT Area:     3.14 cm  LV Volumes (MOD) LV vol d, MOD A2C: 52.3 ml LV vol d, MOD A4C: 55.0 ml LV vol s, MOD A2C: 28.6 ml LV vol s, MOD A4C: 27.8 ml LV SV MOD A2C:     23.7 ml LV SV MOD A4C:     55.0 ml LV SV MOD BP:      26.3 ml RIGHT VENTRICLE             IVC RV S prime:     10.80 cm/s  IVC diam: 1.70 cm TAPSE (M-mode): 1.3 cm LEFT ATRIUM             Index        RIGHT ATRIUM          Index LA diam:        2.70 cm 1.83 cm/m   RA Area:     7.73 cm LA Vol (A2C):   36.8 ml 24.91 ml/m  RA Volume:   13.20 ml 8.93 ml/m LA Vol (A4C):   23.5 ml 15.90 ml/m LA Biplane Vol: 29.5 ml 19.96 ml/m  AORTIC VALVE LVOT Vmax:   112.00 cm/s LVOT Vmean:  79.300  cm/s LVOT VTI:    0.207 m  AORTA Ao Root diam: 3.10 cm Ao Asc diam:  3.10 cm MITRAL VALVE MV Area (PHT): 3.72 cm    SHUNTS MV Decel Time: 204 msec    Systemic VTI:  0.21 m MV E velocity: 46.70 cm/s  Systemic Diam: 2.00 cm MV A velocity: 73.70 cm/s MV E/A ratio:  0.63 Arta Lark Electronically signed by Arta Lark Signature Date/Time: 12/15/2023/4:29:52 PM    Final    MR BRAIN W WO CONTRAST Result Date: 12/14/2023 CLINICAL DATA:  Provided history: Metastasis to brain. Brain/CNS neoplasm, assess treatment response. EXAM: MRI HEAD WITHOUT AND WITH CONTRAST TECHNIQUE: Multiplanar, multiecho pulse sequences of the brain and surrounding structures were obtained without and with intravenous contrast. CONTRAST:  5 mL Vueway  intravenous contrast. COMPARISON:  Prior brain MRI examinations 09/14/2023 and earlier. FINDINGS: Brain: No age-advanced or lobar predominant cerebral atrophy. Continued mild increase in extent of enhancement at site of a lesion within the posteromedial right frontal lobe, particularly at the posteroinferior aspect of the lesion (for instance comparing series 16, image 18 of the current examination with series 17, image 19 of the prior examination). Surrounding T2 FLAIR hyperintense parenchymal signal abnormality is similar. Postoperative changes from prior right frontal craniotomy. Irregular enhancement along and adjacent to the underlying anterior right frontal lobe resection cavity has not significantly changed. Surrounding T2 FLAIR hyperintense parenchymal signal abnormality is similar. Postoperative changes from prior left parietooccipital craniotomy. Mild enhancement surrounding the underlying left parietooccipital resection cavity, not significantly changed. Surrounding T2 FLAIR hyperintense parenchymal signal abnormality is similar. Punctate focus of enhancement within the left parietal lobe not appreciated on recent prior exams. This is indeterminate for vascular enhancement versus a  new metastasis (series 13, image 117). Continued mild increase in size of an enhancing lesion within the medial left temporal lobe, now measuring 7 mm (previously 5 mm) (series 13, image 70). Immediately adjacent, new 3 mm ill-defined focus of enhancement within the left temporal lobe (series 13, image 69). Interval increase in bulk of enhancement within/along the anterior right temporal lobe. For instance, there is a nodular focus of enhancement along the inferior right temporal lobe which now measures 1.5 x 1.0 cm on the coronal post-contrast sequence (series 15, image 21) (previously 1.5  x 0.8 cm). Additionally, there is a nodular focus of enhancement along the medial aspect of the left temporal lobe which now measures 2.1 x 1.3 cm in transaxial dimensions (series 13, image 65) (previously 1.7 x 0.6 cm). Progressive edema also present within the anterior right temporal lobe. Unchanged punctate enhancing lesion within the right occipital lobe (series 13, image 92). An 11 mm enhancing lesion within the central pons has not significantly changed. Surrounding edema is similar. Unchanged 9 mm enhancing lesion within the cerebellar vermis. Unchanged punctate enhancing lesion within the left cerebellar hemisphere (series 13, image 42). There is no acute infarct. No extra-axial fluid collection. No midline shift. Vascular: Maintained flow voids within the proximal large arterial vessels. Skull and upper cervical spine: No focal worrisome lesion. Right frontal and left parietooccipital cranioplasties. Sinuses/Orbits: No mass or acute finding within the imaged orbits. Prior right ocular lens replacement. No significant paranasal sinus disease. Impressions #1, #2, #3 and #4 will be called to the ordering clinician or representative by the Radiologist Assistant, and communication documented in the PACS or [employer]. IMPRESSION: 1. Interval increase in bulk of enhancement within/along the anterior right temporal lobe,  likely reflecting progressive disease. Progressive edema also present within the anterior right temporal lobe. 2. Continued increase in extent of enhancement at site of a lesion within the posteromedial right frontal lobe concerning for progressive disease. 3. Continued increase in size of an enhancing lesion within the medial left temporal lobe, now 7 mm (previously 5 mm). This is concerning for progressive disease. Immediately adjacent, new 3 mm ill-defined focus of left temporal lobe enhancement which is indeterminate for vascular enhancement versus a new metastasis. Short-interval MRI follow-up recommended. 4. Punctate focus of enhancement within the left parietal lobe not appreciated on recent prior exams. This is indeterminate for vascular enhancement versus a new metastasis. Short-interval MRI follow-up recommended. 5. The remaining intracranial metastases have remained stable since 09/14/2023. Electronically Signed   By: Bascom Lily D.O.   On: 12/14/2023 16:15    Impression:  The patient has an isolated new area of tumor in the right temporal fossa.  Plan:  Recommend 10 fraction conventional radiation to the right temporal fossa plus margin to 30 Gy in 10 fractions with simulaneous integrated boost to the enhancing lesion.   I personally spent 30 minutes in this encounter including chart review, reviewing radiological studies, meeting face-to-face with the patient, entering orders and completing documentation.   _____________________________________  Trilby Fujisawa Lorri Rota, M.D.

## 2023-12-21 ENCOUNTER — Other Ambulatory Visit: Payer: Self-pay | Admitting: Internal Medicine

## 2023-12-22 ENCOUNTER — Other Ambulatory Visit: Payer: Self-pay | Admitting: *Deleted

## 2023-12-22 ENCOUNTER — Ambulatory Visit
Admission: RE | Admit: 2023-12-22 | Discharge: 2023-12-22 | Disposition: A | Discharge status: Home / Self Care | Source: Ambulatory Visit | Attending: Radiation Oncology | Admitting: Radiation Oncology

## 2023-12-22 ENCOUNTER — Ambulatory Visit (HOSPITAL_COMMUNITY)

## 2023-12-22 ENCOUNTER — Other Ambulatory Visit: Payer: Self-pay

## 2023-12-22 ENCOUNTER — Other Ambulatory Visit (HOSPITAL_COMMUNITY): Payer: Self-pay

## 2023-12-22 DIAGNOSIS — C7931 Secondary malignant neoplasm of brain: Secondary | ICD-10-CM | POA: Insufficient documentation

## 2023-12-22 DIAGNOSIS — C50212 Malignant neoplasm of upper-inner quadrant of left female breast: Secondary | ICD-10-CM | POA: Diagnosis not present

## 2023-12-22 DIAGNOSIS — Z1721 Progesterone receptor positive status: Secondary | ICD-10-CM | POA: Insufficient documentation

## 2023-12-22 DIAGNOSIS — Z510 Encounter for antineoplastic radiation therapy: Secondary | ICD-10-CM | POA: Diagnosis not present

## 2023-12-22 DIAGNOSIS — C50912 Malignant neoplasm of unspecified site of left female breast: Secondary | ICD-10-CM

## 2023-12-22 DIAGNOSIS — Z170 Estrogen receptor positive status [ER+]: Secondary | ICD-10-CM | POA: Insufficient documentation

## 2023-12-22 MED ORDER — PREDNISONE 50 MG PO TABS
ORAL_TABLET | ORAL | 1 refills | Status: DC
  Filled 2023-12-22: qty 3, 1d supply, fill #0

## 2023-12-22 NOTE — Telephone Encounter (Signed)
 Due to pt hx of allergic reaction to IV contrast verbal orders received from MD for pt to receive 13 hour prep with 50 mg p.o prednisone  and 50 mg p.o OTC benadryl . RN attempted x1 to contact pt regarding 13 hr prep and to take 1 tablet of p.o prednisone  13 hours prior to scan, 2nd tablet of prednisone  7 hours prior to scan and 3rd tablet of prednisone  1 hour prior to scan as well as 50 mg p.o OTC benadryl  1 hours prior to the scan.  No answer, LVM for pt to return call to the office.  Prednisone  prescription sent to pharmacy on file.

## 2023-12-22 NOTE — Progress Notes (Signed)
  Radiation Oncology         (336) [phone] ________________________________  Name: Melissa [name] MRN: [id number]  Date: 12/22/2023  DOB: 05-06-1966  SIMULATION AND TREATMENT PLANNING NOTE    ICD-10-CM   1. 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  Identity was confirmed.  All relevant records and images related to the planned course of therapy were reviewed.  The patient freely provided informed written consent to proceed with treatment after reviewing the details related to the planned course of therapy. The consent form was witnessed and verified by the simulation staff.  Then, the patient was set-up in a stable reproducible  supine position for radiation therapy.  CT images were obtained.  Surface markings were placed.  The CT images were loaded into the planning software.  Then the target and avoidance structures were contoured.  Treatment planning then occurred.  The radiation prescription was entered and confirmed.  Then, I designed and supervised the construction of a total of 3 medically necessary complex treatment device including a custom made thermoplastic mask used for immobilization, and two MLC collimator apertures for radiotherapy from the right and left side, with independent collimation for each to account for beam divergence.  I have requested : Isodose Plan.    PLAN:  The right temporal fossa plus margin will be treated to 30 Gy in 10 fractions with simulaneous integrated boost to the 15 mm enhancing temporal lesion.    ________________________________  Trilby Fujisawa Lorri Rota, M.D.

## 2023-12-25 ENCOUNTER — Other Ambulatory Visit: Payer: Self-pay | Admitting: Hematology and Oncology

## 2023-12-25 ENCOUNTER — Other Ambulatory Visit (HOSPITAL_COMMUNITY): Payer: Self-pay

## 2023-12-25 ENCOUNTER — Ambulatory Visit: Admitting: Radiation Oncology

## 2023-12-25 ENCOUNTER — Telehealth: Payer: Self-pay

## 2023-12-25 ENCOUNTER — Institutional Professional Consult (permissible substitution): Admitting: Radiation Oncology

## 2023-12-25 MED ORDER — HYDROCOD POLI-CHLORPHE POLI ER 10-8 MG/5ML PO SUER
5.0000 mL | Freq: Two times a day (BID) | ORAL | 0 refills | Status: DC
  Filled 2023-12-25: qty 115, 12d supply, fill #0

## 2023-12-25 NOTE — Telephone Encounter (Signed)
 Pt called and LVM asking for refill on tussionex. She reports she is in [place] on vacation and asks for it to be sent to [facility] in [place]. Advised pt I made MD aware of her request. She verbalized thanks and understanding.

## 2023-12-26 ENCOUNTER — Other Ambulatory Visit: Payer: Self-pay | Admitting: Hematology and Oncology

## 2023-12-26 ENCOUNTER — Other Ambulatory Visit: Payer: Self-pay

## 2023-12-26 ENCOUNTER — Other Ambulatory Visit (HOSPITAL_COMMUNITY): Payer: Self-pay

## 2023-12-26 ENCOUNTER — Encounter: Payer: Self-pay | Admitting: Hematology and Oncology

## 2023-12-26 ENCOUNTER — Other Ambulatory Visit: Payer: Self-pay | Admitting: Internal Medicine

## 2023-12-26 MED ORDER — HYDROCOD POLI-CHLORPHE POLI ER 10-8 MG/5ML PO SUER
5.0000 mL | Freq: Two times a day (BID) | ORAL | 0 refills | Status: DC
Start: 2023-12-26 — End: 2024-01-09

## 2023-12-26 NOTE — Progress Notes (Signed)
 Tussionex dispensed to pharmacy in [place] per pt's choice.  Melissa [name]

## 2023-12-27 ENCOUNTER — Telehealth: Payer: Self-pay | Admitting: Hematology and Oncology

## 2023-12-27 NOTE — Telephone Encounter (Signed)
 left vm about scheduled appt date and time

## 2023-12-29 DIAGNOSIS — C50212 Malignant neoplasm of upper-inner quadrant of left female breast: Secondary | ICD-10-CM | POA: Diagnosis not present

## 2023-12-29 DIAGNOSIS — C7931 Secondary malignant neoplasm of brain: Secondary | ICD-10-CM | POA: Diagnosis not present

## 2023-12-29 DIAGNOSIS — Z510 Encounter for antineoplastic radiation therapy: Secondary | ICD-10-CM | POA: Diagnosis not present

## 2023-12-29 DIAGNOSIS — Z1721 Progesterone receptor positive status: Secondary | ICD-10-CM | POA: Diagnosis not present

## 2023-12-29 DIAGNOSIS — Z170 Estrogen receptor positive status [ER+]: Secondary | ICD-10-CM | POA: Diagnosis not present

## 2024-01-01 ENCOUNTER — Ambulatory Visit
Admission: RE | Admit: 2024-01-01 | Discharge: 2024-01-01 | Disposition: A | Discharge status: Home / Self Care | Source: Ambulatory Visit | Attending: Radiation Oncology | Admitting: Radiation Oncology

## 2024-01-01 ENCOUNTER — Other Ambulatory Visit: Payer: Self-pay

## 2024-01-01 DIAGNOSIS — Z1721 Progesterone receptor positive status: Secondary | ICD-10-CM | POA: Diagnosis not present

## 2024-01-01 DIAGNOSIS — Z510 Encounter for antineoplastic radiation therapy: Secondary | ICD-10-CM | POA: Diagnosis not present

## 2024-01-01 DIAGNOSIS — Z170 Estrogen receptor positive status [ER+]: Secondary | ICD-10-CM | POA: Diagnosis not present

## 2024-01-01 DIAGNOSIS — C50212 Malignant neoplasm of upper-inner quadrant of left female breast: Secondary | ICD-10-CM | POA: Diagnosis not present

## 2024-01-01 DIAGNOSIS — C7931 Secondary malignant neoplasm of brain: Secondary | ICD-10-CM | POA: Diagnosis not present

## 2024-01-01 LAB — RAD ONC ARIA SESSION SUMMARY
Course Elapsed Days: 0
Plan Fractions Treated to Date: 1
Plan Prescribed Dose Per Fraction: 4 Gy
Plan Total Fractions Prescribed: 10
Plan Total Prescribed Dose: 40 Gy
Reference Point Dosage Given to Date: 4 Gy
Reference Point Session Dosage Given: 4 Gy
Session Number: 1

## 2024-01-02 ENCOUNTER — Ambulatory Visit
Admission: RE | Admit: 2024-01-02 | Discharge: 2024-01-02 | Disposition: A | Discharge status: Home / Self Care | Source: Ambulatory Visit | Attending: Radiation Oncology

## 2024-01-02 ENCOUNTER — Other Ambulatory Visit: Payer: Self-pay

## 2024-01-02 ENCOUNTER — Ambulatory Visit: Admitting: Hematology and Oncology

## 2024-01-02 ENCOUNTER — Ambulatory Visit

## 2024-01-02 ENCOUNTER — Encounter: Admitting: Nutrition

## 2024-01-02 ENCOUNTER — Other Ambulatory Visit

## 2024-01-02 DIAGNOSIS — Z1721 Progesterone receptor positive status: Secondary | ICD-10-CM | POA: Diagnosis not present

## 2024-01-02 DIAGNOSIS — Z510 Encounter for antineoplastic radiation therapy: Secondary | ICD-10-CM | POA: Diagnosis not present

## 2024-01-02 DIAGNOSIS — C50212 Malignant neoplasm of upper-inner quadrant of left female breast: Secondary | ICD-10-CM | POA: Diagnosis not present

## 2024-01-02 DIAGNOSIS — C7931 Secondary malignant neoplasm of brain: Secondary | ICD-10-CM | POA: Diagnosis not present

## 2024-01-02 DIAGNOSIS — Z170 Estrogen receptor positive status [ER+]: Secondary | ICD-10-CM | POA: Diagnosis not present

## 2024-01-02 LAB — RAD ONC ARIA SESSION SUMMARY
Course Elapsed Days: 1
Plan Fractions Treated to Date: 2
Plan Prescribed Dose Per Fraction: 4 Gy
Plan Total Fractions Prescribed: 10
Plan Total Prescribed Dose: 40 Gy
Reference Point Dosage Given to Date: 8 Gy
Reference Point Session Dosage Given: 4 Gy
Session Number: 2

## 2024-01-03 ENCOUNTER — Ambulatory Visit
Admission: RE | Admit: 2024-01-03 | Discharge: 2024-01-03 | Disposition: A | Discharge status: Home / Self Care | Source: Ambulatory Visit | Attending: Radiation Oncology

## 2024-01-03 ENCOUNTER — Other Ambulatory Visit: Payer: Self-pay

## 2024-01-03 DIAGNOSIS — Z510 Encounter for antineoplastic radiation therapy: Secondary | ICD-10-CM | POA: Diagnosis not present

## 2024-01-03 DIAGNOSIS — Z170 Estrogen receptor positive status [ER+]: Secondary | ICD-10-CM | POA: Diagnosis not present

## 2024-01-03 DIAGNOSIS — Z1721 Progesterone receptor positive status: Secondary | ICD-10-CM | POA: Diagnosis not present

## 2024-01-03 DIAGNOSIS — C7931 Secondary malignant neoplasm of brain: Secondary | ICD-10-CM | POA: Diagnosis not present

## 2024-01-03 DIAGNOSIS — C50212 Malignant neoplasm of upper-inner quadrant of left female breast: Secondary | ICD-10-CM | POA: Diagnosis not present

## 2024-01-03 LAB — RAD ONC ARIA SESSION SUMMARY
Course Elapsed Days: 2
Plan Fractions Treated to Date: 3
Plan Prescribed Dose Per Fraction: 4 Gy
Plan Total Fractions Prescribed: 10
Plan Total Prescribed Dose: 40 Gy
Reference Point Dosage Given to Date: 12 Gy
Reference Point Session Dosage Given: 4 Gy
Session Number: 3

## 2024-01-04 ENCOUNTER — Ambulatory Visit
Admission: RE | Admit: 2024-01-04 | Discharge: 2024-01-04 | Disposition: A | Discharge status: Home / Self Care | Source: Ambulatory Visit | Attending: Radiation Oncology | Admitting: Radiation Oncology

## 2024-01-04 ENCOUNTER — Other Ambulatory Visit: Payer: Self-pay | Admitting: Hematology and Oncology

## 2024-01-04 ENCOUNTER — Other Ambulatory Visit: Payer: Self-pay

## 2024-01-04 DIAGNOSIS — Z170 Estrogen receptor positive status [ER+]: Secondary | ICD-10-CM | POA: Diagnosis not present

## 2024-01-04 DIAGNOSIS — C50212 Malignant neoplasm of upper-inner quadrant of left female breast: Secondary | ICD-10-CM | POA: Diagnosis not present

## 2024-01-04 DIAGNOSIS — Z510 Encounter for antineoplastic radiation therapy: Secondary | ICD-10-CM | POA: Diagnosis not present

## 2024-01-04 DIAGNOSIS — Z1721 Progesterone receptor positive status: Secondary | ICD-10-CM | POA: Diagnosis not present

## 2024-01-04 DIAGNOSIS — C7931 Secondary malignant neoplasm of brain: Secondary | ICD-10-CM | POA: Diagnosis not present

## 2024-01-04 LAB — RAD ONC ARIA SESSION SUMMARY
Course Elapsed Days: 3
Plan Fractions Treated to Date: 4
Plan Prescribed Dose Per Fraction: 4 Gy
Plan Total Fractions Prescribed: 10
Plan Total Prescribed Dose: 40 Gy
Reference Point Dosage Given to Date: 16 Gy
Reference Point Session Dosage Given: 4 Gy
Session Number: 4

## 2024-01-05 ENCOUNTER — Other Ambulatory Visit: Payer: Self-pay

## 2024-01-05 ENCOUNTER — Ambulatory Visit
Admission: RE | Admit: 2024-01-05 | Discharge: 2024-01-05 | Disposition: A | Discharge status: Home / Self Care | Source: Ambulatory Visit | Attending: Radiation Oncology

## 2024-01-05 ENCOUNTER — Ambulatory Visit (HOSPITAL_COMMUNITY)
Admission: RE | Admit: 2024-01-05 | Discharge: 2024-01-05 | Disposition: A | Discharge status: Home / Self Care | Source: Ambulatory Visit | Attending: Hematology and Oncology | Admitting: Hematology and Oncology

## 2024-01-05 ENCOUNTER — Ambulatory Visit
Admission: RE | Admit: 2024-01-05 | Discharge: 2024-01-05 | Disposition: A | Discharge status: Home / Self Care | Source: Ambulatory Visit | Attending: Radiation Oncology | Admitting: Radiation Oncology

## 2024-01-05 DIAGNOSIS — C7981 Secondary malignant neoplasm of breast: Secondary | ICD-10-CM | POA: Diagnosis not present

## 2024-01-05 DIAGNOSIS — K429 Umbilical hernia without obstruction or gangrene: Secondary | ICD-10-CM | POA: Diagnosis not present

## 2024-01-05 DIAGNOSIS — Z171 Estrogen receptor negative status [ER-]: Secondary | ICD-10-CM | POA: Diagnosis not present

## 2024-01-05 DIAGNOSIS — Z170 Estrogen receptor positive status [ER+]: Secondary | ICD-10-CM | POA: Diagnosis not present

## 2024-01-05 DIAGNOSIS — C50212 Malignant neoplasm of upper-inner quadrant of left female breast: Secondary | ICD-10-CM | POA: Insufficient documentation

## 2024-01-05 DIAGNOSIS — K7689 Other specified diseases of liver: Secondary | ICD-10-CM | POA: Diagnosis not present

## 2024-01-05 DIAGNOSIS — C7931 Secondary malignant neoplasm of brain: Secondary | ICD-10-CM | POA: Diagnosis not present

## 2024-01-05 DIAGNOSIS — Z510 Encounter for antineoplastic radiation therapy: Secondary | ICD-10-CM | POA: Diagnosis not present

## 2024-01-05 DIAGNOSIS — I700 Atherosclerosis of aorta: Secondary | ICD-10-CM | POA: Diagnosis not present

## 2024-01-05 DIAGNOSIS — Z1721 Progesterone receptor positive status: Secondary | ICD-10-CM | POA: Diagnosis not present

## 2024-01-05 LAB — RAD ONC ARIA SESSION SUMMARY
Course Elapsed Days: 4
Plan Fractions Treated to Date: 5
Plan Prescribed Dose Per Fraction: 4 Gy
Plan Total Fractions Prescribed: 10
Plan Total Prescribed Dose: 40 Gy
Reference Point Dosage Given to Date: 20 Gy
Reference Point Session Dosage Given: 4 Gy
Session Number: 5

## 2024-01-05 LAB — POCT I-STAT CREATININE: Creatinine, Ser: 0.7 mg/dL (ref 0.44–1.00)

## 2024-01-05 MED ORDER — HEPARIN SOD (PORK) LOCK FLUSH 100 UNIT/ML IV SOLN
INTRAVENOUS | Status: AC
  Filled 2024-01-05: qty 5

## 2024-01-05 MED ORDER — HEPARIN SOD (PORK) LOCK FLUSH 100 UNIT/ML IV SOLN
500.0000 [IU] | Freq: Once | INTRAVENOUS | Status: AC
  Administered 2024-01-05: 500 [IU] via INTRAVENOUS

## 2024-01-05 MED ORDER — IOHEXOL 300 MG/ML  SOLN
100.0000 mL | Freq: Once | INTRAMUSCULAR | Status: AC | PRN
  Administered 2024-01-05: 100 mL via INTRAVENOUS

## 2024-01-08 ENCOUNTER — Other Ambulatory Visit: Payer: Self-pay | Admitting: *Deleted

## 2024-01-08 ENCOUNTER — Institutional Professional Consult (permissible substitution): Admitting: Radiation Oncology

## 2024-01-08 ENCOUNTER — Encounter

## 2024-01-08 ENCOUNTER — Ambulatory Visit
Admission: RE | Admit: 2024-01-08 | Discharge: 2024-01-08 | Disposition: A | Discharge status: Home / Self Care | Source: Ambulatory Visit | Attending: Radiation Oncology

## 2024-01-08 ENCOUNTER — Ambulatory Visit: Admitting: Radiation Oncology

## 2024-01-08 ENCOUNTER — Other Ambulatory Visit: Payer: Self-pay

## 2024-01-08 DIAGNOSIS — C50212 Malignant neoplasm of upper-inner quadrant of left female breast: Secondary | ICD-10-CM | POA: Diagnosis not present

## 2024-01-08 DIAGNOSIS — Z510 Encounter for antineoplastic radiation therapy: Secondary | ICD-10-CM | POA: Diagnosis not present

## 2024-01-08 DIAGNOSIS — Z1721 Progesterone receptor positive status: Secondary | ICD-10-CM | POA: Diagnosis not present

## 2024-01-08 DIAGNOSIS — C7931 Secondary malignant neoplasm of brain: Secondary | ICD-10-CM | POA: Diagnosis not present

## 2024-01-08 DIAGNOSIS — Z170 Estrogen receptor positive status [ER+]: Secondary | ICD-10-CM | POA: Diagnosis not present

## 2024-01-08 LAB — RAD ONC ARIA SESSION SUMMARY
Course Elapsed Days: 7
Plan Fractions Treated to Date: 6
Plan Prescribed Dose Per Fraction: 4 Gy
Plan Total Fractions Prescribed: 10
Plan Total Prescribed Dose: 40 Gy
Reference Point Dosage Given to Date: 24 Gy
Reference Point Session Dosage Given: 4 Gy
Session Number: 6

## 2024-01-09 ENCOUNTER — Ambulatory Visit
Admission: RE | Admit: 2024-01-09 | Discharge: 2024-01-09 | Disposition: A | Discharge status: Home / Self Care | Source: Ambulatory Visit | Attending: Radiation Oncology | Admitting: Radiation Oncology

## 2024-01-09 ENCOUNTER — Other Ambulatory Visit: Payer: Self-pay

## 2024-01-09 ENCOUNTER — Inpatient Hospital Stay: Admitting: Nutrition

## 2024-01-09 ENCOUNTER — Inpatient Hospital Stay (HOSPITAL_BASED_OUTPATIENT_CLINIC_OR_DEPARTMENT_OTHER): Admitting: Adult Health

## 2024-01-09 ENCOUNTER — Inpatient Hospital Stay

## 2024-01-09 VITALS — BP 97/57 | HR 63 | Temp 98.4°F | Resp 17 | Ht 62.0 in | Wt 106.3 lb

## 2024-01-09 DIAGNOSIS — C7931 Secondary malignant neoplasm of brain: Secondary | ICD-10-CM | POA: Diagnosis not present

## 2024-01-09 DIAGNOSIS — C50912 Malignant neoplasm of unspecified site of left female breast: Secondary | ICD-10-CM | POA: Diagnosis not present

## 2024-01-09 DIAGNOSIS — Z1721 Progesterone receptor positive status: Secondary | ICD-10-CM | POA: Insufficient documentation

## 2024-01-09 DIAGNOSIS — Z170 Estrogen receptor positive status [ER+]: Secondary | ICD-10-CM | POA: Diagnosis not present

## 2024-01-09 DIAGNOSIS — Z171 Estrogen receptor negative status [ER-]: Secondary | ICD-10-CM

## 2024-01-09 DIAGNOSIS — Z1732 Human epidermal growth factor receptor 2 negative status: Secondary | ICD-10-CM | POA: Insufficient documentation

## 2024-01-09 DIAGNOSIS — C787 Secondary malignant neoplasm of liver and intrahepatic bile duct: Secondary | ICD-10-CM | POA: Insufficient documentation

## 2024-01-09 DIAGNOSIS — C50212 Malignant neoplasm of upper-inner quadrant of left female breast: Secondary | ICD-10-CM | POA: Diagnosis not present

## 2024-01-09 DIAGNOSIS — C7951 Secondary malignant neoplasm of bone: Secondary | ICD-10-CM | POA: Insufficient documentation

## 2024-01-09 DIAGNOSIS — Z95828 Presence of other vascular implants and grafts: Secondary | ICD-10-CM

## 2024-01-09 DIAGNOSIS — Z510 Encounter for antineoplastic radiation therapy: Secondary | ICD-10-CM | POA: Insufficient documentation

## 2024-01-09 DIAGNOSIS — Z5112 Encounter for antineoplastic immunotherapy: Secondary | ICD-10-CM | POA: Insufficient documentation

## 2024-01-09 LAB — CMP (CANCER CENTER ONLY)
ALT: 6 U/L (ref 0–44)
AST: 9 U/L — ABNORMAL LOW (ref 15–41)
Albumin: 3.5 g/dL (ref 3.5–5.0)
Alkaline Phosphatase: 89 U/L (ref 38–126)
Anion gap: 5 (ref 5–15)
BUN: 9 mg/dL (ref 6–20)
CO2: 28 mmol/L (ref 22–32)
Calcium: 9.4 mg/dL (ref 8.9–10.3)
Chloride: 105 mmol/L (ref 98–111)
Creatinine: 0.68 mg/dL (ref 0.44–1.00)
GFR, Estimated: >60 mL/min (ref >60)
Glucose, Bld: 72 mg/dL (ref 70–99)
Potassium: 4.2 mmol/L (ref 3.5–5.1)
Sodium: 138 mmol/L (ref 135–145)
Total Bilirubin: 0.2 mg/dL (ref 0.0–1.2)
Total Protein: 7.2 g/dL (ref 6.5–8.1)

## 2024-01-09 LAB — RAD ONC ARIA SESSION SUMMARY
Course Elapsed Days: 8
Plan Fractions Treated to Date: 7
Plan Prescribed Dose Per Fraction: 4 Gy
Plan Total Fractions Prescribed: 10
Plan Total Prescribed Dose: 40 Gy
Reference Point Dosage Given to Date: 28 Gy
Reference Point Session Dosage Given: 4 Gy
Session Number: 7

## 2024-01-09 LAB — CBC WITH DIFFERENTIAL (CANCER CENTER ONLY)
Abs Immature Granulocytes: 0.03 10*3/uL (ref 0.00–0.07)
Basophils Absolute: 0.1 10*3/uL (ref 0.0–0.1)
Basophils Relative: 1 %
Eosinophils Absolute: 0.8 10*3/uL — ABNORMAL HIGH (ref 0.0–0.5)
Eosinophils Relative: 9 %
HCT: 30.2 % — ABNORMAL LOW (ref 36.0–46.0)
Hemoglobin: 9.8 g/dL — ABNORMAL LOW (ref 12.0–15.0)
Immature Granulocytes: 0 %
Lymphocytes Relative: 22 %
Lymphs Abs: 1.9 10*3/uL (ref 0.7–4.0)
MCH: 26.1 pg (ref 26.0–34.0)
MCHC: 32.5 g/dL (ref 30.0–36.0)
MCV: 80.5 fL (ref 80.0–100.0)
Monocytes Absolute: 0.5 10*3/uL (ref 0.1–1.0)
Monocytes Relative: 6 %
Neutro Abs: 5.4 10*3/uL (ref 1.7–7.7)
Neutrophils Relative %: 62 %
Platelet Count: 362 10*3/uL (ref 150–400)
RBC: 3.75 MIL/uL — ABNORMAL LOW (ref 3.87–5.11)
RDW: 17.5 % — ABNORMAL HIGH (ref 11.5–15.5)
WBC Count: 8.8 10*3/uL (ref 4.0–10.5)
nRBC: 0 % (ref 0.0–0.2)

## 2024-01-09 LAB — TOTAL PROTEIN, URINE DIPSTICK: Protein, ur: NEGATIVE mg/dL

## 2024-01-09 MED ORDER — TRASTUZUMAB-ANNS CHEMO 150 MG IV SOLR
6.0000 mg/kg | Freq: Once | INTRAVENOUS | Status: AC
  Administered 2024-01-09: 300 mg via INTRAVENOUS
  Filled 2024-01-09: qty 14.29

## 2024-01-09 MED ORDER — DIPHENHYDRAMINE HCL 25 MG PO CAPS
25.0000 mg | ORAL_CAPSULE | Freq: Once | ORAL | Status: AC
  Administered 2024-01-09: 25 mg via ORAL
  Filled 2024-01-09: qty 1

## 2024-01-09 MED ORDER — SODIUM CHLORIDE 0.9% FLUSH
10.0000 mL | INTRAVENOUS | Status: DC | PRN
  Administered 2024-01-09: 10 mL via INTRAVENOUS

## 2024-01-09 MED ORDER — HYDROCOD POLI-CHLORPHE POLI ER 10-8 MG/5ML PO SUER: 5.0000 mL | Freq: Two times a day (BID) | ORAL | 0 refills | Status: DC

## 2024-01-09 MED ORDER — SODIUM CHLORIDE 0.9% FLUSH
10.0000 mL | INTRAVENOUS | Status: DC | PRN
  Administered 2024-01-09: 10 mL

## 2024-01-09 MED ORDER — ACETAMINOPHEN 325 MG PO TABS
650.0000 mg | ORAL_TABLET | Freq: Once | ORAL | Status: AC
  Administered 2024-01-09: 650 mg via ORAL
  Filled 2024-01-09: qty 2

## 2024-01-09 MED ORDER — SODIUM CHLORIDE 0.9 % IV SOLN: Freq: Once | INTRAVENOUS | Status: AC

## 2024-01-09 MED ORDER — HEPARIN SOD (PORK) LOCK FLUSH 100 UNIT/ML IV SOLN
500.0000 [IU] | Freq: Once | INTRAVENOUS | Status: AC | PRN
  Administered 2024-01-09: 500 [IU]

## 2024-01-09 MED ORDER — SODIUM CHLORIDE 0.9 % IV SOLN
7.5000 mg/kg | Freq: Once | INTRAVENOUS | Status: AC
  Administered 2024-01-09: 350 mg via INTRAVENOUS
  Filled 2024-01-09: qty 14

## 2024-01-09 NOTE — Patient Instructions (Signed)
 [facility] - A DEPT OF [place]  Discharge Instructions: Thank you for choosing [facility] to provide your oncology and hematology care.   If you have a lab appointment with the Cancer Center, please go directly to the Cancer Center and check in at the registration area.   Wear comfortable clothing and clothing appropriate for easy access to any Portacath or PICC line.   We strive to give you quality time with your provider. You may need to reschedule your appointment if you arrive late (15 or more minutes).  Arriving late affects you and other patients whose appointments are after yours.  Also, if you miss three or more appointments without notifying the office, you may be dismissed from the clinic at the provider's discretion.      For prescription refill requests, have your pharmacy contact our office and allow 72 hours for refills to be completed.    Today you received the following chemotherapy and/or immunotherapy agents: Mvasi , Kanjinti .       To help prevent nausea and vomiting after your treatment, we encourage you to take your nausea medication as directed.  BELOW ARE SYMPTOMS THAT SHOULD BE REPORTED IMMEDIATELY: *FEVER GREATER THAN 100.4 F (38 C) OR HIGHER *CHILLS OR SWEATING *NAUSEA AND VOMITING THAT IS NOT CONTROLLED WITH YOUR NAUSEA MEDICATION *UNUSUAL SHORTNESS OF BREATH *UNUSUAL BRUISING OR BLEEDING *URINARY PROBLEMS (pain or burning when urinating, or frequent urination) *BOWEL PROBLEMS (unusual diarrhea, constipation, pain near the anus) TENDERNESS IN MOUTH AND THROAT WITH OR WITHOUT PRESENCE OF ULCERS (sore throat, sores in mouth, or a toothache) UNUSUAL RASH, SWELLING OR PAIN  UNUSUAL VAGINAL DISCHARGE OR ITCHING   Items with * indicate a potential emergency and should be followed up as soon as possible or go to the Emergency Department if any problems should occur.  Please show the CHEMOTHERAPY ALERT CARD or  IMMUNOTHERAPY ALERT CARD at check-in to the Emergency Department and triage nurse.  Should you have questions after your visit or need to cancel or reschedule your appointment, please contact [facility] - A DEPT OF [facility]  Dept: [phone]  and follow the prompts.  Office hours are 8:00 a.m. to 4:30 p.m. Monday - Friday. Please note that voicemails left after 4:00 p.m. may not be returned until the following business day.  We are closed weekends and major holidays. You have access to a nurse at all times for urgent questions. Please call the main number to the clinic Dept: [phone] and follow the prompts.   For any non-urgent questions, you may also contact your provider using MyChart. We now offer e-Visits for anyone 70 and older to request care online for non-urgent symptoms. For details visit [email or web address].   Also download the MyChart app! Go to the app store, search MyChart, open the app, select [place], and log in with your MyChart username and password.

## 2024-01-09 NOTE — Progress Notes (Signed)
 Nutrition follow-up completed with patient during infusion.  Patient is receiving Herceptin  and Avastin  for metastatic breast cancer.  Her final radiation therapy to brain is scheduled for July 7.  Weight:  106 pounds 4.8 ounces July 1 106 pounds 14.4 ounces April 22 109 pounds January 28.  Labs were reviewed.  Patient reports increased fatigue.  Reports she no longer tolerates [employer] 1.4.  She has not tried to drink any since she had multiple episodes of diarrhea after consuming.  She has no questions or concerns today.  Her weight has been stable.  Nutrition diagnosis: Unintended weight loss is stable.  Intervention: Educated patient on other ways to increase calories and protein.  Will try some different oral nutrition supplements.  Provided samples.  Encourage patient to reach out to RD if she tolerates.  Monitoring, evaluation, goals: Patient will tolerate adequate calories and protein to minimize weight loss.  Next visit: To be scheduled as needed.  Patient has RD contact information.  **Disclaimer: This note was dictated with voice recognition software. Similar sounding words can inadvertently be transcribed and this note may contain transcription errors which may not have been corrected upon publication of note.**

## 2024-01-09 NOTE — Progress Notes (Signed)
 [facility] Cancer Follow up:    [name], MD 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 ---s/p Lumpectomy/ /Initial Ca 1997, Brain Mets 2019 Staging form: Breast, AJCC 7th Edition - Clinical: Stage IIA (T2, N0, cM0) - Unsigned Histopathologic type: 9931 Laterality: Left Tumor size (mm): 13 Staging comments: Staged at breast conference 12.4.13  - Pathologic: Stage IIA (T1c, N1, cM0) - Unsigned Histopathologic type: 9931 Laterality: Left Tumor size (mm): 13 - Pathologic: Stage IV (M1) - Signed by Crawford Morna Pickle, NP on 12/20/2022    SUMMARY OF ONCOLOGIC HISTORY: Oncology History  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 Ki-67 was 70% HER-2 was amplified with a ratio 2.91   07/12/2012 - 07/17/2013 Neo-Adjuvant Chemotherapy   TCH 6 followed by Herceptin  maintenance   08/14/2012 Genetic Testing   Negative genetic testing on the [facility].  The report date is August 14, 2012.  The [facility] gene panel offered by [facility] includes sequencing and deletion/duplication testing of the following 25 genes: APC, ATM, , BARD1, BMPR1A, BRCA1, BRCA2, BRIP1, CHD1, CDK4, CDKN2A, CHEK2, EPCAM (large rearrangement only), MLH1, MSH2, MSH6, MUTYH, NBN, PALB2, PMS2, PTEN, RAD51C, RAD51D, SMAD4, STK11, and TP53.     12/11/2012 Surgery   Left breast lumpectomy: 1.8 cm tumor 1 positive sentinel node, axillary lymph node dissection 02/08/2013 showed 0/13 lymph nodes   03/25/2013 - 05/06/2013 Radiation Therapy   Adjuvant radiation therapy   06/05/2013 - 07/20/2017 Anti-estrogen oral therapy   Tamoxifen  20 mg  daily   07/27/2017 Relapse/Recurrence   MRI Brain: 3.4 x 2.9 x 2.9 cm RIGHT frontal lobe mass with imaging characteristics of solitary metastasis. Extensive vasogenic edema resulting in 9 mm RIGHT to LEFT midline shift. Equivocal very early LEFT ventricle entrapment.    08/04/2017 Surgery   Rt frontal brain resection: Poorly differentiated tumor IHC suggests breast primary ER and PR Positive   08/25/2017 - 09/04/2017 Radiation Therapy   Stereotactic radiation   09/18/2017 -  Anti-estrogen oral therapy   Lapatinib  with letrozole    12/18/2017 - 12/19/2017 Radiation Therapy   New right parietal lobe metastases status post [facility]   08/27/2018 - 08/27/2020 Radiation Therapy   SRS to new brain metastases   05/09/2019 Relapse/Recurrence   Interval increase in size of the enhancing nodular left internal mammary soft tissue 2.8 cm.  Redemonstrated enlarged supraclavicular, lower cervical and lower posterior cervical nodes unchanged.  Interval increase in the bony erosion of the posterior and lateral left third rib, increasing soft tissue lesion eroding the left sternal body 3.1 cm was 2.5 cm.  Bronchiectatic changes   06/19/2019 - 05/05/2020 Chemotherapy   ado-trastuzumab emtansine  (KADCYLA )     06/15/2020 -  Chemotherapy   Xeloda , Tucatinib , Herceptin     04/12/2022 - 09/06/2022 Chemotherapy   Patient is on Treatment Plan : BREAST Trastuzumab  IV (8/6) or SQ (600) D1 q21d     09/27/2022 -  Chemotherapy   Patient is on Treatment Plan : BREAST MAINTENANCE Trastuzumab  IV every 3 weeks (21 days)     11/08/2022 Miscellaneous   Adding low-dose neratinib  (80 mg) to Herceptin    12/20/2022 Cancer Staging   Staging form:  Breast, AJCC 7th Edition - Pathologic: Stage IV (M1) - Signed by Crawford Morna Pickle, NP on 12/20/2022   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  History of Present Illness Melissa [name] is a 58 year old female with metastatic breast cancer who presents for follow-up accompanied by her husband prior to treatment with Herceptin  & Avastin .  She has metastatic breast cancer with an increase in brain metastases noted on a recent MRI. She underwent salvage external beam radiation therapy. Her weight has decreased from 109 to 106 pounds despite feeling like she is eating more. A companion observed inadequate eating.  She experiences fatigue and mood changes, feeling down after her mother's recent passing, though her mood is improving. Her mood worsens in the presence of her father, who has dementia. She is taking Keppra , 250 mg in the morning and 500 mg at night, with no seizures since starting this medication.  She uses a cough syrup at night to manage coughing when lying on her back, which helps her sleep. She plans to see a pulmonologist for her ongoing cough and a nutritionist for weight and appetite issues. She smokes, which may contribute to her cough. No depression or anxiety is reported. She acknowledges normal tiredness from radiation and usual wheezing.     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   Port-A-Cath in place 08/05/2019   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/Initial Ca 1997, Brain Mets 2019 06/10/2019   Mediastinal adenopathy    H/O coccidioidomycosis and Aspergillosus 05/16/2018   DOE (dyspnea on exertion) 05/03/2018   Diarrhea 04/22/2018   Cavitary lesion of lung in area of previous cyts in apex of Sup Segment of LLL 03/29/2018   Aortic atherosclerosis (HCC) 01/22/2018   Emphysema lung (HCC) 01/22/2018   Metastasis to brain (HCC) 08/04/2017   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 (HCC) 07/27/2017   Nicotine  abuse 07/27/2017   Migraines 07/27/2017   Upper airway cough syndrome 08/10/2016   Abnormal echocardiogram 08/07/2013   Chest tightness or pressure 08/07/2013   Hx of radiation therapy    Edema of left lower extremity 11/19/2012   Tachycardia 09/20/2012   Obstructive bronchiectasis (HCC) 01/26/2011   Cigarette smoker 01/26/2011    is allergic to aspirin, protonix  [pantoprazole ], doxycycline , promethazine -codeine , sulfamethoxazole-trimethoprim , and iodinated contrast media.  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[facility])    Coccidioidomycosis 11/05/2023   Complication of anesthesia    bp dropped + desat    COPD (chronic obstructive pulmonary disease) (HCC)    Dyspnea    DOE   GERD (gastroesophageal reflux disease)    H/O coccidioidomycosis    was reason for lung lobectomy   Headache(784.0)    due to eye strain or not eating    Hemoptysis 05/08/2023   History of anemia    no current problem   History of asthma    as a child   History of breast cancer 2014   left   History of chemotherapy    finished 07/17/2013   History of hiatal hernia    AGE 56   Hx of radiation therapy 03/25/13-05/06/13   left breast 5000 cGy/25 sessions, left breast boost 1000 cGy/5 sessions   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   Personal history of chemotherapy    for liver cancer   Personal history of radiation therapy    Pneumonia    LAST FLARE UP 01/2018   Rash 02/22/2021   Runny nose 07/30/2013   clear drainage   Smoker 05/24/2021   Wears dentures    upper    SURGICAL HISTORY: Past Surgical History:  Procedure Laterality Date   APPLICATION OF CRANIAL NAVIGATION N/A 08/04/2017   Procedure: APPLICATION OF CRANIAL NAVIGATION;  Surgeon: [name], Morene Hicks, MD;  Location: [facility] OR;  Service: Neurosurgery;  Laterality: N/A;   AXILLARY LYMPH NODE DISSECTION Left 02/08/2013   Procedure: LEFT AXILLARY DISSECTION;  Surgeon: Morene ONEIDA Olives, MD;  Location: [facility];  Service: General;  Laterality: Left;   BREAST CYST EXCISION Right 2006   BREAST LUMPECTOMY Left 2014   BREAST LUMPECTOMY WITH NEEDLE LOCALIZATION AND AXILLARY SENTINEL LYMPH NODE BX Left 12/31/2012   Procedure: NEEDLE LOCALIZATION LEFT BREAST LUMPECTOMY AND LEFT AXILLARY SENTENIAL LYMPH NODE BX;  Surgeon: Morene ONEIDA Olives, MD;  Location: [facility];  Service: General;  Laterality: Left;   BRONCHIAL BRUSHINGS  03/17/2020   Procedure: BRONCHIAL BRUSHINGS;  Surgeon: Brenna Adine CROME, DO;  Location: [facility];  Service: Pulmonary;;   BRONCHIAL WASHINGS  03/17/2020   Procedure: BRONCHIAL WASHINGS;  Surgeon: Brenna Adine CROME, DO;  Location: [facility];  Service: Pulmonary;;   CESAREAN SECTION  1995/1996   CRANIOTOMY Right 08/04/2017   Procedure: Right Frontal craniotomy for resection of tumor with stereotactic navigation;  Surgeon: [name], Morene Hicks, MD;   Location: [facility] OR;  Service: Neurosurgery;  Laterality: Right;  Right Frontal craniotomy for resection of tumor with stereotactic navigation   IR CV LINE INJECTION  02/19/2021   IR CV LINE INJECTION  11/12/2021   IR IMAGING GUIDED PORT INSERTION  05/31/2019   LUNG LOBECTOMY Left 05/1996   upper lobe - due to [facility] Fever   PORT-A-CATH REMOVAL Right 08/02/2013   Procedure: REMOVAL PORT-A-CATH;  Surgeon: Morene ONEIDA Olives, MD;  Location: [facility];  Service: General;  Laterality: Right;   PORTACATH PLACEMENT  07/02/2012   Procedure: INSERTION PORT-A-CATH;  Surgeon: Morene ONEIDA Olives, MD;  Location: [facility];  Service: General;  Laterality: N/A;  right   VIDEO BRONCHOSCOPY N/A 03/17/2020   Procedure: VIDEO BRONCHOSCOPY;  Surgeon: Brenna Adine CROME, DO;  [facility];  Service: Pulmonary;  Laterality: N/A;   VIDEO BRONCHOSCOPY WITH ENDOBRONCHIAL ULTRASOUND N/A 05/23/2018   Procedure: VIDEO BRONCHOSCOPY WITH ENDOBRONCHIAL ULTRASOUND;  Surgeon: Brenna Adine CROME, DO;  Location: [facility];  Service: Thoracic;  Laterality: N/A;    SOCIAL HISTORY: Social History   Socioeconomic History   Marital status: Married    Spouse name: Not on file   Number of children: 2   Years of education: Not on file   Highest education level: Not on file  Occupational History   Occupation: Investment banker, corporate  Tobacco Use   Smoking status: Every Day    Current packs/day: 0.30    Average packs/day: 0.3 packs/day for 38.0 years (11.4 ttl pk-yrs)    Types: Cigarettes   Smokeless tobacco: Never   Tobacco comments:    12 cigarettes a day-05/08/23  Vaping Use   Vaping status: Never Used  Substance and Sexual Activity   Alcohol use: No   Drug use: No   Sexual activity: Not Currently  Other Topics Concern   Not on file  Social History Narrative   Not on file   Social Drivers of Health   Financial Resource Strain: Low Risk  (01/03/2023)   Received from [facility]   Overall Financial Resource Strain (CARDIA)    Difficulty of Paying Living Expenses: Not hard at all  Food Insecurity: No Food Insecurity (12/20/2023)   Hunger Vital Sign    Worried About Running Out of Food in the Last Year: Never true    Ran Out of Food in the Last Year: Never true  Transportation Needs: No Transportation Needs (12/20/2023)   PRAPARE - Administrator, Civil Service (Medical): No    Lack of Transportation (Non-Medical): No  Physical Activity: Not on file  Stress: Not on file  Social Connections: Not on file  Intimate Partner Violence: Not At Risk (12/20/2023)   Humiliation, Afraid, Rape, and Kick questionnaire    Fear of Current or Ex-Partner: No    Emotionally Abused: No    Physically Abused: No    Sexually Abused: No    FAMILY HISTORY: Family History  Problem Relation Age of Onset   Cancer Mother        breast   Emphysema Mother        was a smoker   Heart disease Mother    Melanoma Mother        dx in her 92s   Breast cancer Mother 16   Asthma Brother    Cancer Cousin        breast   Breast cancer Cousin 20 - 54       mother's maternal cousin    Review of Systems  Constitutional:  Positive for fatigue. Negative for appetite change, chills, fever and unexpected weight change.  HENT:   Negative for hearing loss, lump/mass and trouble swallowing.   Eyes:  Negative for eye problems and icterus.  Respiratory:  Positive for cough and wheezing (at baseline). Negative for chest tightness, hemoptysis and shortness of breath.   Cardiovascular:  Negative for chest pain, leg swelling and palpitations.  Gastrointestinal:  Negative for abdominal distention, abdominal pain, constipation, diarrhea, nausea and vomiting.  Endocrine: Negative for hot flashes.  Genitourinary:  Negative for difficulty urinating.   Musculoskeletal:  Negative for arthralgias.  Skin:  Negative for itching and rash.  Neurological:  Negative for dizziness, extremity  weakness, headaches and numbness.  Hematological:  Negative for adenopathy. Does not bruise/bleed easily.  Psychiatric/Behavioral:  Negative for depression. The patient is not nervous/anxious.       PHYSICAL EXAMINATION     Vitals:   01/09/24 1052  BP: (!) 97/57  Pulse: 63  Resp: 17  Temp: 98.4 F (36.9 C)  SpO2: 100%    Physical Exam Constitutional:      General: She is not in acute distress.    Appearance: Normal appearance. She is not toxic-appearing.  HENT:     Head: Normocephalic and atraumatic.     Mouth/Throat:     Mouth: Mucous membranes are moist.     Pharynx: Oropharynx is clear. No oropharyngeal exudate or posterior oropharyngeal erythema.  Eyes:     General: No scleral icterus. Cardiovascular:     Rate and Rhythm: Normal rate and regular rhythm.     Pulses: Normal pulses.     Heart sounds: Normal heart sounds.  Pulmonary:     Effort: Pulmonary effort is normal.     Breath sounds: Wheezing (mild) present.  Abdominal:     General: Abdomen is flat. Bowel sounds are normal. There is no distension.     Palpations: Abdomen is soft.     Tenderness: There is no abdominal tenderness.  Musculoskeletal:        General: No swelling.     Cervical back: Neck supple.  Lymphadenopathy:     Cervical: No cervical adenopathy.  Skin:    General: Skin is warm and dry.     Findings: No rash.  Neurological:     General: No focal deficit present.     Mental Status: She is alert.  Psychiatric:        Mood and Affect: Mood normal.        Behavior: Behavior normal.     LABORATORY DATA:  CBC    Component Value Date/Time   WBC 8.8 01/09/2024 1004   WBC 11.4 (H) 03/28/2023 1051   RBC 3.75 (L) 01/09/2024 1004   HGB 9.8 (L) 01/09/2024 1004   HGB 13.1 08/12/2014 0955   HCT 30.2 (L) 01/09/2024 1004   HCT 40.4 08/12/2014 0955   PLT 362 01/09/2024 1004   PLT 228 08/12/2014 0955   MCV 80.5 01/09/2024 1004   MCV 98.2 08/12/2014 0955   MCH 26.1 01/09/2024 1004   MCHC  32.5 01/09/2024 1004   RDW 17.5 (H) 01/09/2024 1004   RDW 13.0 08/12/2014 0955   LYMPHSABS 1.9 01/09/2024 1004   LYMPHSABS 2.0 08/12/2014 0955   MONOABS 0.5 01/09/2024 1004   MONOABS 0.5 08/12/2014 0955   EOSABS 0.8 (H) 01/09/2024 1004   EOSABS 0.2 08/12/2014 0955   BASOSABS 0.1 01/09/2024 1004   BASOSABS 0.0 08/12/2014 0955    CMP     Component Value Date/Time   NA 138 01/09/2024 1004   NA 142 08/12/2014 0956   K 4.2 01/09/2024 1004   K 4.6 08/12/2014 0956   CL 105 01/09/2024 1004   CL 100 12/20/2012 1509   CO2 28 01/09/2024 1004   CO2 26 08/12/2014 0956   GLUCOSE 72 01/09/2024 1004   GLUCOSE 94 08/12/2014 0956   GLUCOSE 98 12/20/2012 1509   BUN 9 01/09/2024 1004   BUN 10.1 08/12/2014 0956   CREATININE 0.68 01/09/2024 1004   CREATININE 0.83 03/28/2023 1051   CREATININE 0.9 08/12/2014 0956   CALCIUM 9.4 01/09/2024 1004   CALCIUM 9.3 08/12/2014 0956  PROT 7.2 01/09/2024 1004   PROT 7.1 08/12/2014 0956   ALBUMIN 3.5 01/09/2024 1004   ALBUMIN 3.8 08/12/2014 0956   AST 9 (L) 01/09/2024 1004   AST 15 08/12/2014 0956   ALT 6 01/09/2024 1004   ALT 11 08/12/2014 0956   ALKPHOS 89 01/09/2024 1004   ALKPHOS 68 08/12/2014 0956   BILITOT 0.2 01/09/2024 1004   BILITOT 0.25 08/12/2014 0956   GFRNONAA >60 01/09/2024 1004   GFRNONAA 76 03/11/2019 1057   GFRAA >60 03/24/2020 1012   GFRAA >60 02/11/2020 0836   GFRAA 88 03/11/2019 1057     ASSESSMENT and THERAPY PLAN:   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  SRS brain: 08/25/2017-09/04/2017 2. Anti Her 2 therapy with Lapatinib  started 09/17/2017-05/14/2019: Stopped for progression 3.  I discontinued tamoxifen  and started her on letrozole  2.5 mg daily.  05/14/2019 stopped for progression 4.  Stereotactic radiosurgery 12/19/2017 to the new right parietal lobe metastases. 5.  Kadcyla : Received 16 cycles discontinued 05/05/2020 6.  Tucatinib  Xeloda : Discontinued because of hand-foot syndrome 06/11/2020-12/15/2020 7. Herceptin /Neratinib : unable to tolerate due to diarrhea and weight loss 8.  SRS to new brain mets 10/01/2021 9.  Resection of brain metastases at [facility] 12/29/2022: Metastatic breast cancer proc panel pending 10.  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 Undergoing salvage external beam radiation therapy. - Administer Herceptin  and Avastin . - Perform urine protein test to monitor for potential side effects of Avastin . - Ensure labs are conducted prior to Avastin  administration for  safety.  Radiation-induced fatigue Increased fatigue likely due to radiation therapy for brain metastases.  Seizure disorder Seizure disorder managed with Keppra . No recent seizures reported. - Refill Keppra  prescription at [facility] in [place] on [address]. - Ensure correct dosage of Keppra  is prescribed.  Chronic cough due to underlying lung condition Chronic cough exacerbated when lying on back, especially during radiation sessions. Cough syrup provides symptomatic relief. - Refill Tussionex. - Advise follow-up with pulmonologist to evaluate chronic cough.  RTC in 3 weeks for labs, f/u and continued treatment.    All questions were answered. The patient knows to call the clinic with any problems, questions or concerns. We can certainly see the patient much sooner if necessary.  Total encounter time: 30 minutes*in face-to-face visit time, chart review, lab review, care coordination, order entry, and documentation of the encounter time.  Morna Kendall, NP 01/15/24 9:08 AM Medical Oncology and Hematology [facility] [address] [place], [place] [address] Tel. [phone]    Fax. [phone]  *Total Encounter Time as defined by the Centers for Medicare and Medicaid Services includes, in addition to the face-to-face time of a patient visit (documented in the note above) non-face-to-face time: obtaining and reviewing outside history, ordering and reviewing medications, tests or procedures, care coordination (communications with other health care professionals or caregivers) and documentation in the medical record.

## 2024-01-10 ENCOUNTER — Other Ambulatory Visit: Payer: Self-pay

## 2024-01-10 ENCOUNTER — Ambulatory Visit

## 2024-01-10 ENCOUNTER — Ambulatory Visit
Admission: RE | Admit: 2024-01-10 | Discharge: 2024-01-10 | Disposition: A | Discharge status: Home / Self Care | Source: Ambulatory Visit | Attending: Radiation Oncology

## 2024-01-10 DIAGNOSIS — Z170 Estrogen receptor positive status [ER+]: Secondary | ICD-10-CM | POA: Diagnosis not present

## 2024-01-10 DIAGNOSIS — Z510 Encounter for antineoplastic radiation therapy: Secondary | ICD-10-CM | POA: Diagnosis not present

## 2024-01-10 DIAGNOSIS — C7931 Secondary malignant neoplasm of brain: Secondary | ICD-10-CM | POA: Diagnosis not present

## 2024-01-10 DIAGNOSIS — Z1721 Progesterone receptor positive status: Secondary | ICD-10-CM | POA: Diagnosis not present

## 2024-01-10 DIAGNOSIS — C50212 Malignant neoplasm of upper-inner quadrant of left female breast: Secondary | ICD-10-CM | POA: Diagnosis not present

## 2024-01-10 LAB — RAD ONC ARIA SESSION SUMMARY
Course Elapsed Days: 9
Plan Fractions Treated to Date: 8
Plan Prescribed Dose Per Fraction: 4 Gy
Plan Total Fractions Prescribed: 10
Plan Total Prescribed Dose: 40 Gy
Reference Point Dosage Given to Date: 32 Gy
Reference Point Session Dosage Given: 4 Gy
Session Number: 8

## 2024-01-11 ENCOUNTER — Other Ambulatory Visit: Payer: Self-pay | Admitting: *Deleted

## 2024-01-11 ENCOUNTER — Ambulatory Visit
Admission: RE | Admit: 2024-01-11 | Discharge: 2024-01-11 | Disposition: A | Discharge status: Home / Self Care | Source: Ambulatory Visit | Attending: Radiation Oncology | Admitting: Radiation Oncology

## 2024-01-11 ENCOUNTER — Ambulatory Visit

## 2024-01-11 ENCOUNTER — Other Ambulatory Visit: Payer: Self-pay

## 2024-01-11 DIAGNOSIS — Z510 Encounter for antineoplastic radiation therapy: Secondary | ICD-10-CM | POA: Diagnosis not present

## 2024-01-11 DIAGNOSIS — Z1721 Progesterone receptor positive status: Secondary | ICD-10-CM | POA: Diagnosis not present

## 2024-01-11 DIAGNOSIS — C50212 Malignant neoplasm of upper-inner quadrant of left female breast: Secondary | ICD-10-CM | POA: Diagnosis not present

## 2024-01-11 DIAGNOSIS — Z170 Estrogen receptor positive status [ER+]: Secondary | ICD-10-CM | POA: Diagnosis not present

## 2024-01-11 DIAGNOSIS — C7931 Secondary malignant neoplasm of brain: Secondary | ICD-10-CM | POA: Diagnosis not present

## 2024-01-11 LAB — RAD ONC ARIA SESSION SUMMARY
Course Elapsed Days: 10
Plan Fractions Treated to Date: 9
Plan Prescribed Dose Per Fraction: 4 Gy
Plan Total Fractions Prescribed: 10
Plan Total Prescribed Dose: 40 Gy
Reference Point Dosage Given to Date: 36 Gy
Reference Point Session Dosage Given: 4 Gy
Session Number: 9

## 2024-01-15 ENCOUNTER — Encounter: Payer: Self-pay | Admitting: Hematology and Oncology

## 2024-01-15 ENCOUNTER — Other Ambulatory Visit: Payer: Self-pay

## 2024-01-15 ENCOUNTER — Ambulatory Visit

## 2024-01-15 ENCOUNTER — Ambulatory Visit
Admission: RE | Admit: 2024-01-15 | Discharge: 2024-01-15 | Disposition: A | Discharge status: Home / Self Care | Source: Ambulatory Visit | Attending: Radiation Oncology | Admitting: Radiation Oncology

## 2024-01-15 DIAGNOSIS — Z170 Estrogen receptor positive status [ER+]: Secondary | ICD-10-CM | POA: Diagnosis not present

## 2024-01-15 DIAGNOSIS — Z510 Encounter for antineoplastic radiation therapy: Secondary | ICD-10-CM | POA: Diagnosis not present

## 2024-01-15 DIAGNOSIS — C7931 Secondary malignant neoplasm of brain: Secondary | ICD-10-CM | POA: Diagnosis not present

## 2024-01-15 DIAGNOSIS — C50212 Malignant neoplasm of upper-inner quadrant of left female breast: Secondary | ICD-10-CM | POA: Diagnosis not present

## 2024-01-15 DIAGNOSIS — Z1721 Progesterone receptor positive status: Secondary | ICD-10-CM | POA: Diagnosis not present

## 2024-01-15 LAB — RAD ONC ARIA SESSION SUMMARY
Course Elapsed Days: 14
Plan Fractions Treated to Date: 10
Plan Prescribed Dose Per Fraction: 4 Gy
Plan Total Fractions Prescribed: 10
Plan Total Prescribed Dose: 40 Gy
Reference Point Dosage Given to Date: 40 Gy
Reference Point Session Dosage Given: 4 Gy
Session Number: 10

## 2024-01-15 NOTE — Assessment & Plan Note (Addendum)
 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  SRS brain: 08/25/2017-09/04/2017 2. Anti Her 2 therapy with Lapatinib  started 09/17/2017-05/14/2019: Stopped for progression 3.  I discontinued tamoxifen  and started her on letrozole  2.5 mg daily.  05/14/2019 stopped for progression 4.  Stereotactic radiosurgery 12/19/2017 to the new right parietal lobe metastases. 5.  Kadcyla : Received 16 cycles discontinued 05/05/2020 6.  Tucatinib  Xeloda : Discontinued because of hand-foot syndrome 06/11/2020-12/15/2020 7. Herceptin /Neratinib : unable to tolerate due to diarrhea and weight loss 8.  SRS to new brain mets 10/01/2021 9.  Resection of brain metastases at [facility] 12/29/2022: Metastatic breast cancer proc panel pending 10.  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 Undergoing salvage external beam radiation therapy. - Administer Herceptin  and Avastin . - Perform urine protein test to monitor for potential side effects of Avastin . - Ensure labs are conducted prior to Avastin  administration for safety.  Radiation-induced fatigue Increased fatigue likely due to radiation therapy for brain metastases.  Seizure disorder Seizure disorder managed with Keppra . No recent seizures reported. - Refill Keppra  prescription at [facility] in [place] on [address]. - Ensure correct dosage of Keppra  is prescribed.  Chronic cough due to underlying lung condition Chronic cough exacerbated when lying on back, especially during radiation sessions. Cough syrup provides symptomatic relief. - Refill Tussionex. - Advise follow-up with pulmonologist to evaluate chronic cough.  RTC in 3 weeks for labs, f/u and continued treatment.

## 2024-01-16 NOTE — Radiation Completion Notes (Addendum)
  Radiation Oncology         [phone] ________________________________  Name: Melissa [name] MRN: [id number]  Date: 01/15/2024  DOB: 27-Aug-1965   Referring Physician: ARTHEA MANNS, M.D. Date of Service: 2024-01-16 Radiation Oncologist: Adina Barge, M.D. 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  She tolerated her treatments well with only modest fatigue.   The patient will receive a call in about one month from the radiation oncology department and we will arrange for a post-treatment MRI brain scan in 3 months. I will call her with the results of her scan once available and she will continue follow up with her medical oncologist, Dr. Gudena as well. ------------------------------------------------   Donnice Barge, MD [facility]  Radiation Oncology Direct Dial: [phone]  Fax: [phone] [email or web address]  Skype  LinkedIn

## 2024-01-27 ENCOUNTER — Other Ambulatory Visit: Payer: Self-pay | Admitting: Internal Medicine

## 2024-01-27 MED ORDER — HYDROCOD POLI-CHLORPHE POLI ER 10-8 MG/5ML PO SUER: 5.0000 mL | Freq: Two times a day (BID) | ORAL | 0 refills | Status: DC

## 2024-01-30 ENCOUNTER — Inpatient Hospital Stay

## 2024-01-30 ENCOUNTER — Inpatient Hospital Stay (HOSPITAL_BASED_OUTPATIENT_CLINIC_OR_DEPARTMENT_OTHER): Admitting: Hematology and Oncology

## 2024-01-30 ENCOUNTER — Ambulatory Visit: Admitting: Nutrition

## 2024-01-30 ENCOUNTER — Other Ambulatory Visit: Payer: Self-pay | Admitting: *Deleted

## 2024-01-30 VITALS — BP 101/46 | HR 69 | Temp 98.2°F | Resp 16 | Ht 62.0 in | Wt 103.0 lb

## 2024-01-30 DIAGNOSIS — Z171 Estrogen receptor negative status [ER-]: Secondary | ICD-10-CM | POA: Diagnosis not present

## 2024-01-30 DIAGNOSIS — Z170 Estrogen receptor positive status [ER+]: Secondary | ICD-10-CM | POA: Diagnosis not present

## 2024-01-30 DIAGNOSIS — Z510 Encounter for antineoplastic radiation therapy: Secondary | ICD-10-CM | POA: Diagnosis not present

## 2024-01-30 DIAGNOSIS — C50912 Malignant neoplasm of unspecified site of left female breast: Secondary | ICD-10-CM

## 2024-01-30 DIAGNOSIS — C7931 Secondary malignant neoplasm of brain: Secondary | ICD-10-CM

## 2024-01-30 DIAGNOSIS — Z95828 Presence of other vascular implants and grafts: Secondary | ICD-10-CM

## 2024-01-30 DIAGNOSIS — C50212 Malignant neoplasm of upper-inner quadrant of left female breast: Secondary | ICD-10-CM

## 2024-01-30 DIAGNOSIS — Z1721 Progesterone receptor positive status: Secondary | ICD-10-CM | POA: Diagnosis not present

## 2024-01-30 LAB — TOTAL PROTEIN, URINE DIPSTICK: Protein, ur: NEGATIVE mg/dL

## 2024-01-30 LAB — CBC WITH DIFFERENTIAL (CANCER CENTER ONLY)
Abs Immature Granulocytes: 0.03 K/uL (ref 0.00–0.07)
Basophils Absolute: 0.1 K/uL (ref 0.0–0.1)
Basophils Relative: 1 %
Eosinophils Absolute: 0.9 K/uL — ABNORMAL HIGH (ref 0.0–0.5)
Eosinophils Relative: 7 %
HCT: 28.9 % — ABNORMAL LOW (ref 36.0–46.0)
Hemoglobin: 9.3 g/dL — ABNORMAL LOW (ref 12.0–15.0)
Immature Granulocytes: 0 %
Lymphocytes Relative: 16 %
Lymphs Abs: 1.9 K/uL (ref 0.7–4.0)
MCH: 25.8 pg — ABNORMAL LOW (ref 26.0–34.0)
MCHC: 32.2 g/dL (ref 30.0–36.0)
MCV: 80.3 fL (ref 80.0–100.0)
Monocytes Absolute: 0.8 K/uL (ref 0.1–1.0)
Monocytes Relative: 7 %
Neutro Abs: 8.4 K/uL — ABNORMAL HIGH (ref 1.7–7.7)
Neutrophils Relative %: 69 %
Platelet Count: 464 K/uL — ABNORMAL HIGH (ref 150–400)
RBC: 3.6 MIL/uL — ABNORMAL LOW (ref 3.87–5.11)
RDW: 17.2 % — ABNORMAL HIGH (ref 11.5–15.5)
WBC Count: 12.1 K/uL — ABNORMAL HIGH (ref 4.0–10.5)
nRBC: 0 % (ref 0.0–0.2)

## 2024-01-30 LAB — CMP (CANCER CENTER ONLY)
ALT: 7 U/L (ref 0–44)
AST: 10 U/L — ABNORMAL LOW (ref 15–41)
Albumin: 3.3 g/dL — ABNORMAL LOW (ref 3.5–5.0)
Alkaline Phosphatase: 93 U/L (ref 38–126)
Anion gap: 5 (ref 5–15)
BUN: 7 mg/dL (ref 6–20)
CO2: 30 mmol/L (ref 22–32)
Calcium: 9.3 mg/dL (ref 8.9–10.3)
Chloride: 101 mmol/L (ref 98–111)
Creatinine: 0.64 mg/dL (ref 0.44–1.00)
GFR, Estimated: >60 mL/min (ref >60)
Glucose, Bld: 81 mg/dL (ref 70–99)
Potassium: 4.3 mmol/L (ref 3.5–5.1)
Sodium: 136 mmol/L (ref 135–145)
Total Bilirubin: 0.2 mg/dL (ref 0.0–1.2)
Total Protein: 7.4 g/dL (ref 6.5–8.1)

## 2024-01-30 MED ORDER — TRASTUZUMAB-ANNS CHEMO 150 MG IV SOLR
6.0000 mg/kg | Freq: Once | INTRAVENOUS | Status: AC
  Administered 2024-01-30: 300 mg via INTRAVENOUS
  Filled 2024-01-30: qty 14.29

## 2024-01-30 MED ORDER — ACETAMINOPHEN 325 MG PO TABS
650.0000 mg | ORAL_TABLET | Freq: Once | ORAL | Status: AC
  Administered 2024-01-30: 650 mg via ORAL
  Filled 2024-01-30: qty 2

## 2024-01-30 MED ORDER — DIPHENHYDRAMINE HCL 25 MG PO CAPS
25.0000 mg | ORAL_CAPSULE | Freq: Once | ORAL | Status: AC
  Administered 2024-01-30: 25 mg via ORAL
  Filled 2024-01-30: qty 1

## 2024-01-30 MED ORDER — SODIUM CHLORIDE 0.9 % IV SOLN
Freq: Once | INTRAVENOUS | Status: AC
Start: 2024-01-30 — End: 2024-01-30

## 2024-01-30 MED ORDER — SODIUM CHLORIDE 0.9% FLUSH
10.0000 mL | INTRAVENOUS | Status: DC | PRN
  Administered 2024-01-30: 10 mL via INTRAVENOUS

## 2024-01-30 MED ORDER — SODIUM CHLORIDE 0.9 % IV SOLN
7.5000 mg/kg | Freq: Once | INTRAVENOUS | Status: AC
  Administered 2024-01-30: 350 mg via INTRAVENOUS
  Filled 2024-01-30: qty 14

## 2024-01-30 NOTE — Progress Notes (Signed)
 Nutrition follow-up completed with patient during infusion.  Patient is receiving Herceptin  and Avastin  for metastatic breast cancer with Brain mets 2019.  Her final radiation therapy to brain was completed July 7.   Weight:  103 pounds July 22. 106 pounds 4.8 ounces July 1 106 pounds 14.4 ounces April 22 109 pounds January 28.   Labs include albumin 3.3.   Patient tried Mallie Pinion 1.5 peptide and really enjoyed it.  States she is willing to drink these more often. Reports she has increased some of her other ONS as well.   Nutrition diagnosis: Unintended weight loss, continues.   Intervention: Provided samples of [employer] peptide 1.4.   Provided ordering information in case patient would like to order from the company.  Monitoring, evaluation, goals: Patient will tolerate adequate calories and protein to minimize weight loss.   Next visit: To be scheduled as needed.  Patient has RD contact information.

## 2024-01-30 NOTE — Progress Notes (Signed)
 Patient Care Team: [name], MD as PCP - General (Internal Medicine) Van Knee, MD as Referring Physician (Emergency Medicine)  DIAGNOSIS:  Encounter Diagnosis  Name Primary?   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 Ki-67 was 70% HER-2 was amplified with a ratio 2.91   07/12/2012 - 07/17/2013 Neo-Adjuvant Chemotherapy   TCH 6 followed by Herceptin  maintenance   08/14/2012 Genetic Testing   Negative genetic testing on the [employer].  The report date is August 14, 2012.  The [facility] gene panel offered by [facility] includes sequencing and deletion/duplication testing of the following 25 genes: APC, ATM, , BARD1, BMPR1A, BRCA1, BRCA2, BRIP1, CHD1, CDK4, CDKN2A, CHEK2, EPCAM (large rearrangement only), MLH1, MSH2, MSH6, MUTYH, NBN, PALB2, PMS2, PTEN, RAD51C, RAD51D, SMAD4, STK11, and TP53.     12/11/2012 Surgery   Left breast lumpectomy: 1.8 cm tumor 1 positive sentinel node, axillary lymph node dissection 02/08/2013 showed 0/13 lymph nodes   03/25/2013 - 05/06/2013 Radiation Therapy   Adjuvant radiation therapy   06/05/2013 - 07/20/2017 Anti-estrogen oral therapy   Tamoxifen  20 mg daily   07/27/2017 Relapse/Recurrence   MRI Brain: 3.4 x 2.9 x 2.9 cm RIGHT frontal lobe mass with imaging characteristics of solitary metastasis. Extensive vasogenic edema resulting in 9 mm RIGHT to LEFT midline shift. Equivocal very early LEFT ventricle entrapment.    08/04/2017 Surgery   Rt frontal brain resection: Poorly differentiated tumor IHC suggests breast primary ER and PR  Positive   08/25/2017 - 09/04/2017 Radiation Therapy   Stereotactic radiation   09/18/2017 -  Anti-estrogen oral therapy   Lapatinib  with letrozole    12/18/2017 - 12/19/2017 Radiation Therapy   New right parietal lobe metastases status post [facility]   08/27/2018 - 08/27/2020 Radiation Therapy   SRS to new brain metastases   05/09/2019 Relapse/Recurrence   Interval increase in size of the enhancing nodular left internal mammary soft tissue 2.8 cm.  Redemonstrated enlarged supraclavicular, lower cervical and lower posterior cervical nodes unchanged.  Interval increase in the bony erosion of the posterior and lateral left third rib, increasing soft tissue lesion eroding the left sternal body 3.1 cm was 2.5 cm.  Bronchiectatic changes   06/19/2019 - 05/05/2020 Chemotherapy   ado-trastuzumab emtansine  (KADCYLA )     06/15/2020 -  Chemotherapy   Xeloda , Tucatinib , Herceptin     04/12/2022 - 09/06/2022 Chemotherapy   Patient is on Treatment Plan : BREAST Trastuzumab  IV (8/6) or SQ (600) D1 q21d     09/27/2022 -  Chemotherapy   Patient is on Treatment Plan : BREAST MAINTENANCE Trastuzumab  IV every 3 weeks (21 days)     11/08/2022 Miscellaneous   Adding low-dose neratinib  (80 mg) to Herceptin    12/20/2022 Cancer Staging   Staging form: Breast, AJCC 7th Edition - Pathologic: Stage IV (M1) - Signed by Crawford Morna Pickle, NP on 12/20/2022   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/Initial Ca 1997, Brain Mets 2019  06/10/2019 Initial Diagnosis   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)   06/19/2019 - 05/05/2020 Chemotherapy   ado-trastuzumab emtansine  (KADCYLA )     09/27/2022 -  Chemotherapy  Patient is on Treatment Plan : BREAST MAINTENANCE Trastuzumab  IV every 3 weeks (21 days)     Port-A-Cath in place    CHIEF COMPLIANT: Follow-up on Herceptin  and Avastin   HISTORY OF PRESENT ILLNESS: Discussed the use of AI scribe software for clinical note transcription with the patient, who  gave verbal consent to proceed.  History of Present Illness Melissa [name] is a 58 year old female with a history of brain surgeries and lung issues who presents with concerns about groin pain and knee pain.  She experiences sharp, intermittent groin pain approximately once a week, occurring at various times, whether standing or sitting. Significant knee pain is also present.  She has a history of lung issues, with recent CT scans showing patchy nodular opacities and fluffy changes. She experiences a persistent cough and fatigue. She previously underwent a three-year course of treatment for a suspected aspergillus infection.  She has undergone multiple brain surgeries and recently completed two weeks of radiation therapy, experiencing hair loss and fatigue as side effects.  She is currently on Avastin  and experiences low blood pressure, which is being monitored. Intermittent anemia is present, with hemoglobin levels around 9.3.     ALLERGIES:  is allergic to aspirin, protonix  [pantoprazole ], doxycycline , promethazine -codeine , sulfamethoxazole-trimethoprim , and iodinated contrast media.  MEDICATIONS:  Current Outpatient Medications  Medication Sig Dispense Refill   acetaminophen  (TYLENOL ) 325 MG tablet Take 2 tablets (650 mg total) by mouth every 6 (six) hours as needed for mild pain (or Fever >/= 101). 12 tablet 0   albuterol  (VENTOLIN  HFA) 108 (90 Base) MCG/ACT inhaler Inhale 2 puffs into the lungs every 6 (six) hours as needed for wheezing or shortness of breath (cough). 18 g 5   calcium carbonate (TUMS EX) 750 MG chewable tablet Chew by mouth.     chlorpheniramine -HYDROcodone  (TUSSIONEX) 10-8 MG/5ML Take 5 mLs by mouth 2 (two) times daily. 70 mL 0   escitalopram (LEXAPRO) 10 MG tablet Take 10 mg by mouth daily.     levalbuterol  (XOPENEX ) 0.63 MG/3ML nebulizer solution Take 3 mLs (0.63 mg total) by nebulization every 4 (four) hours as needed for wheezing or shortness of breath. 90 mL 12    levETIRAcetam  (KEPPRA ) 500 MG tablet Take 250 mg by mouth 2 (two) times daily.     lidocaine -prilocaine  (EMLA ) cream APPLY EXTERNALLY TO THE AFFECTED AREA 1 TIME 30 g 3   naproxen sodium (ALEVE) 220 MG tablet Take by mouth.     ondansetron  (ZOFRAN -ODT) 4 MG disintegrating tablet Take 4 mg by mouth every 8 (eight) hours as needed.     predniSONE  (DELTASONE ) 50 MG tablet Take 1 tablet 13 hrs prior to scan, 2nd tablet 7 hrs prior, 3rd tablet 1 hr prior 3 tablet 1   Multiple Vitamins-Minerals (EMERGEN-C VITAMIN C PO) Take by mouth.     SYMBICORT  160-4.5 MCG/ACT inhaler Inhale 2 puffs into the lungs 2 (two) times daily. (Patient not taking: Reported on 01/30/2024)     No current facility-administered medications for this visit.   Facility-Administered Medications Ordered in Other Visits  Medication Dose Route Frequency Provider Last Rate Last Admin   alteplase  (CATHFLO ACTIVASE ) injection 2 mg  2 mg Intracatheter Once PRN Odean Potts, MD       sodium chloride  flush (NS) 0.9 % injection 10 mL  10 mL Intracatheter PRN Odean Potts, MD   10 mL at 07/06/21 1428    PHYSICAL EXAMINATION: ECOG PERFORMANCE STATUS: 1 - Symptomatic but completely ambulatory  Vitals:  01/30/24 1310  BP: (!) 101/46  Pulse: 69  Resp: 16  Temp: 98.2 F (36.8 C)  SpO2: 99%   Filed Weights   01/30/24 1310  Weight: 103 lb (46.7 kg)    Physical Exam CHEST: Right lung normal. Left lung abnormal.  (exam performed in the presence of a chaperone)  LABORATORY DATA:  I have reviewed the data as listed    Latest Ref Rng & Units 01/09/2024   10:04 AM 01/05/2024   12:46 PM 11/21/2023    9:31 AM  CMP  Glucose 70 - 99 mg/dL 72   79   BUN 6 - 20 mg/dL 9   11   Creatinine 9.55 - 1.00 mg/dL 9.31  9.29  9.27   Sodium 135 - 145 mmol/L 138   138   Potassium 3.5 - 5.1 mmol/L 4.2   4.3   Chloride 98 - 111 mmol/L 105   104   CO2 22 - 32 mmol/L 28   29   Calcium 8.9 - 10.3 mg/dL 9.4   9.4   Total Protein 6.5 - 8.1 g/dL 7.2    7.2   Total Bilirubin 0.0 - 1.2 mg/dL 0.2   0.2   Alkaline Phos 38 - 126 U/L 89   94   AST 15 - 41 U/L 9   10   ALT 0 - 44 U/L 6   7     Lab Results  Component Value Date   WBC 12.1 (H) 01/30/2024   HGB 9.3 (L) 01/30/2024   HCT 28.9 (L) 01/30/2024   MCV 80.3 01/30/2024   PLT 464 (H) 01/30/2024   NEUTROABS 8.4 (H) 01/30/2024    ASSESSMENT & PLAN:  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  SRS brain: 08/25/2017-09/04/2017 2. Anti Her 2 therapy with Lapatinib  started 09/17/2017-05/14/2019: Stopped for progression 3.  I discontinued tamoxifen  and started her on letrozole  2.5 mg daily.  05/14/2019 stopped for progression 4.  Stereotactic radiosurgery 12/19/2017 to the new right parietal lobe metastases. 5.  Kadcyla : Received 16 cycles discontinued 05/05/2020 6.  Tucatinib  Xeloda : Discontinued because of hand-foot syndrome 06/11/2020-12/15/2020 7. Herceptin /Neratinib : unable to tolerate due to diarrhea and weight loss 8.  SRS to new brain mets 10/01/2021 9.  Resection of brain metastases at [facility] 12/29/2022: Metastatic breast cancer proc panel pending 10.  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  Follows with pulmonary  Brain metastases: External beam  radiation given.  Dr. Buckley and CNS tumor board recommended concurrent treatment with Avastin  7.5 mg/kg IV every 3 weeks (for management of recurrent brain metastases and radiation necrosis) Return to clinic every 3 weeks for Herceptin  and every 6 weeks for follow-up with us  ------------------------------------- Assessment and Plan Assessment & Plan Chronic lung infection Chronic lung infection with patchy nodular opacities in the left lung, likely infectious. No new or worsening findings on recent CT. Right lung normal. Not related to cancer recurrence. - Monitor lung condition. - Monitor blood pressure while on Avastin . - Provide explanation for Avastin  to insurance if needed.  Mild anemia Mild anemia with stable hemoglobin around 9.3, likely due to treatment effects. - Monitor hemoglobin levels. - Encourage medication adherence.  Pain in knees and groin Intermittent groin and knee pain. CT scan shows no concerning findings in groin, likely muscular. Knee pain may be inflammatory or arthritic. - Review CT scan findings with her. - Consider arthritis as a potential cause for knee pain.  Dental issue under dentures Tooth emerging under dentures causing discomfort. Avastin  treatment precludes dental extraction currently. - Monitor dental discomfort. - Reassess  dental extraction feasibility after next MRI.      No orders of the defined types were placed in this encounter.  The patient has a good understanding of the overall plan. she agrees with it. she will call with any problems that may develop before the next visit here. Total time spent: 30 mins including face to face time and time spent for planning, charting and co-ordination of care   Naomi MARLA Chad, MD 01/30/24

## 2024-01-30 NOTE — Assessment & Plan Note (Signed)
 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  SRS brain: 08/25/2017-09/04/2017 2. Anti Her 2 therapy with Lapatinib  started 09/17/2017-05/14/2019: Stopped for progression 3.  I discontinued tamoxifen  and started her on letrozole  2.5 mg daily.  05/14/2019 stopped for progression 4.  Stereotactic radiosurgery 12/19/2017 to the new right parietal lobe metastases. 5.  Kadcyla : Received 16 cycles discontinued 05/05/2020 6.  Tucatinib  Xeloda : Discontinued because of hand-foot syndrome 06/11/2020-12/15/2020 7. Herceptin /Neratinib : unable to tolerate due to diarrhea and weight loss 8.  SRS to new brain mets 10/01/2021 9.  Resection of brain metastases at [facility] 12/29/2022: Metastatic breast cancer proc panel pending 10.  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  Follows with pulmonary  Return to clinic every 3 weeks for Herceptin  and every 6 weeks for follow-up with us 

## 2024-02-01 ENCOUNTER — Encounter: Payer: Self-pay | Admitting: *Deleted

## 2024-02-05 ENCOUNTER — Other Ambulatory Visit: Payer: Self-pay | Admitting: Hematology and Oncology

## 2024-02-05 ENCOUNTER — Other Ambulatory Visit: Payer: Self-pay

## 2024-02-05 MED ORDER — HYDROCOD POLI-CHLORPHE POLI ER 10-8 MG/5ML PO SUER: 5.0000 mL | Freq: Two times a day (BID) | ORAL | 0 refills | Status: DC

## 2024-02-05 NOTE — Telephone Encounter (Signed)
 Pt called to request refill for Tussionex. Forwarded to MD and notified pt via VM.

## 2024-02-08 ENCOUNTER — Other Ambulatory Visit: Payer: Self-pay | Admitting: Pharmacist

## 2024-02-08 DIAGNOSIS — C50919 Malignant neoplasm of unspecified site of unspecified female breast: Secondary | ICD-10-CM | POA: Diagnosis not present

## 2024-02-08 DIAGNOSIS — E7800 Pure hypercholesterolemia, unspecified: Secondary | ICD-10-CM | POA: Diagnosis not present

## 2024-02-13 ENCOUNTER — Ambulatory Visit
Admission: RE | Admit: 2024-02-13 | Discharge: 2024-02-13 | Disposition: A | Discharge status: Home / Self Care | Source: Ambulatory Visit | Attending: Radiation Oncology | Admitting: Radiation Oncology

## 2024-02-13 ENCOUNTER — Encounter: Payer: Self-pay | Admitting: *Deleted

## 2024-02-13 DIAGNOSIS — C7931 Secondary malignant neoplasm of brain: Secondary | ICD-10-CM | POA: Insufficient documentation

## 2024-02-13 NOTE — Progress Notes (Signed)
 Error

## 2024-02-13 NOTE — Progress Notes (Addendum)
  Radiation Oncology         [phone] ________________________________  Post Treatment Telephone Note  Diagnosis:  1.5 cm Right temporal lobe metastasis, with a history of ER+ HER2+ cancer in the upper inner quadrant of the left breast    The patient was available for call today.  Call attempts x 2 and left voicemail to return call.  On 3rd attempt was able to reach Melissa [name].  The patient did  note fatigue during radiation. The patient did not note hair loss or skin changes in the field of radiation during therapy. The patient is not taking dexamethasone . The patient does have symptoms of weakness/fatigue reports medication related (Keppra ).  Denies loss of control of the extremities. The patient does have occasional symptoms of headache takes OTC Tylenol . The patient does not have symptoms of seizure or uncontrolled movement (take Keppra  500 mg po BID). The patient does not have symptoms of changes in vision. The patient does not have changes in speech. The patient does not have confusion.   The patient was encouraged to call if  she have not received a call to schedule imaging, or if she develop concerns or questions regarding radiation. The patient will also continue to follow up with Dr. Odean in Medical Oncology.

## 2024-02-14 ENCOUNTER — Other Ambulatory Visit (HOSPITAL_COMMUNITY): Payer: Self-pay

## 2024-02-14 ENCOUNTER — Other Ambulatory Visit: Payer: Self-pay | Admitting: Hematology and Oncology

## 2024-02-14 MED ORDER — HYDROCOD POLI-CHLORPHE POLI ER 10-8 MG/5ML PO SUER: 5.0000 mL | Freq: Two times a day (BID) | ORAL | 0 refills | Status: DC

## 2024-02-15 ENCOUNTER — Other Ambulatory Visit: Payer: Self-pay | Admitting: Radiation Therapy

## 2024-02-15 DIAGNOSIS — C7931 Secondary malignant neoplasm of brain: Secondary | ICD-10-CM

## 2024-02-19 ENCOUNTER — Other Ambulatory Visit: Payer: Self-pay | Admitting: *Deleted

## 2024-02-19 DIAGNOSIS — C7931 Secondary malignant neoplasm of brain: Secondary | ICD-10-CM

## 2024-02-19 MED ORDER — LEVETIRACETAM 250 MG PO TABS: 250.0000 mg | ORAL_TABLET | Freq: Two times a day (BID) | ORAL | 2 refills | Status: DC

## 2024-02-20 ENCOUNTER — Encounter: Payer: Self-pay | Admitting: Adult Health

## 2024-02-20 ENCOUNTER — Inpatient Hospital Stay: Attending: Hematology and Oncology

## 2024-02-20 ENCOUNTER — Inpatient Hospital Stay

## 2024-02-20 ENCOUNTER — Inpatient Hospital Stay: Admitting: Nutrition

## 2024-02-20 ENCOUNTER — Other Ambulatory Visit: Payer: Self-pay | Admitting: *Deleted

## 2024-02-20 ENCOUNTER — Inpatient Hospital Stay: Admitting: Adult Health

## 2024-02-20 VITALS — BP 92/47 | HR 82 | Temp 97.6°F | Resp 18 | Ht 62.0 in | Wt 100.7 lb

## 2024-02-20 DIAGNOSIS — Z171 Estrogen receptor negative status [ER-]: Secondary | ICD-10-CM | POA: Diagnosis not present

## 2024-02-20 DIAGNOSIS — Z5112 Encounter for antineoplastic immunotherapy: Secondary | ICD-10-CM | POA: Insufficient documentation

## 2024-02-20 DIAGNOSIS — C50212 Malignant neoplasm of upper-inner quadrant of left female breast: Secondary | ICD-10-CM | POA: Insufficient documentation

## 2024-02-20 DIAGNOSIS — Z170 Estrogen receptor positive status [ER+]: Secondary | ICD-10-CM | POA: Insufficient documentation

## 2024-02-20 DIAGNOSIS — C50912 Malignant neoplasm of unspecified site of left female breast: Secondary | ICD-10-CM

## 2024-02-20 DIAGNOSIS — Z1721 Progesterone receptor positive status: Secondary | ICD-10-CM | POA: Insufficient documentation

## 2024-02-20 DIAGNOSIS — C7931 Secondary malignant neoplasm of brain: Secondary | ICD-10-CM | POA: Diagnosis not present

## 2024-02-20 DIAGNOSIS — Z95828 Presence of other vascular implants and grafts: Secondary | ICD-10-CM

## 2024-02-20 LAB — CBC WITH DIFFERENTIAL (CANCER CENTER ONLY)
Abs Immature Granulocytes: 0.03 K/uL (ref 0.00–0.07)
Basophils Absolute: 0.1 K/uL (ref 0.0–0.1)
Basophils Relative: 1 %
Eosinophils Absolute: 0.7 K/uL — ABNORMAL HIGH (ref 0.0–0.5)
Eosinophils Relative: 6 %
HCT: 28.6 % — ABNORMAL LOW (ref 36.0–46.0)
Hemoglobin: 9.2 g/dL — ABNORMAL LOW (ref 12.0–15.0)
Immature Granulocytes: 0 %
Lymphocytes Relative: 19 %
Lymphs Abs: 2 K/uL (ref 0.7–4.0)
MCH: 25.4 pg — ABNORMAL LOW (ref 26.0–34.0)
MCHC: 32.2 g/dL (ref 30.0–36.0)
MCV: 79 fL — ABNORMAL LOW (ref 80.0–100.0)
Monocytes Absolute: 0.9 K/uL (ref 0.1–1.0)
Monocytes Relative: 8 %
Neutro Abs: 7.1 K/uL (ref 1.7–7.7)
Neutrophils Relative %: 66 %
Platelet Count: 399 K/uL (ref 150–400)
RBC: 3.62 MIL/uL — ABNORMAL LOW (ref 3.87–5.11)
RDW: 17.4 % — ABNORMAL HIGH (ref 11.5–15.5)
WBC Count: 10.8 K/uL — ABNORMAL HIGH (ref 4.0–10.5)
nRBC: 0 % (ref 0.0–0.2)

## 2024-02-20 LAB — TOTAL PROTEIN, URINE DIPSTICK: Protein, ur: NEGATIVE mg/dL

## 2024-02-20 MED ORDER — ACETAMINOPHEN 325 MG PO TABS
650.0000 mg | ORAL_TABLET | Freq: Once | ORAL | Status: AC
  Administered 2024-02-20 (×2): 650 mg via ORAL
  Filled 2024-02-20: qty 2

## 2024-02-20 MED ORDER — TRASTUZUMAB-ANNS CHEMO 150 MG IV SOLR
6.0000 mg/kg | Freq: Once | INTRAVENOUS | Status: AC
  Administered 2024-02-20 (×2): 300 mg via INTRAVENOUS
  Filled 2024-02-20: qty 14.29

## 2024-02-20 MED ORDER — SODIUM CHLORIDE 0.9% FLUSH
10.0000 mL | INTRAVENOUS | Status: DC | PRN
  Administered 2024-02-20 (×2): 10 mL via INTRAVENOUS

## 2024-02-20 MED ORDER — SODIUM CHLORIDE 0.9 % IV SOLN: Freq: Once | INTRAVENOUS | Status: AC

## 2024-02-20 MED ORDER — DIPHENHYDRAMINE HCL 25 MG PO CAPS
25.0000 mg | ORAL_CAPSULE | Freq: Once | ORAL | Status: AC
  Administered 2024-02-20 (×2): 25 mg via ORAL
  Filled 2024-02-20: qty 1

## 2024-02-20 MED ORDER — SODIUM CHLORIDE 0.9 % IV SOLN
Freq: Once | INTRAVENOUS | Status: AC
Start: 2024-02-20 — End: 2024-02-20

## 2024-02-20 MED ORDER — SODIUM CHLORIDE 0.9 % IV SOLN
7.5000 mg/kg | Freq: Once | INTRAVENOUS | Status: AC
  Administered 2024-02-20 (×2): 350 mg via INTRAVENOUS
  Filled 2024-02-20: qty 14

## 2024-02-20 NOTE — Progress Notes (Signed)
 [facility] Cancer Follow up:    Melissa Trula SQUIBB, MD 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 ---s/p Lumpectomy/ /Initial Ca 1997, Brain Mets 2019 Staging form: Breast, AJCC 7th Edition - Clinical: Stage IIA (T2, N0, cM0) - Unsigned Histopathologic type: 9931 Laterality: Left Tumor size (mm): 13 Staging comments: Staged at breast conference 12.4.13  - Pathologic: Stage IIA (T1c, N1, cM0) - Unsigned Histopathologic type: 9931 Laterality: Left Tumor size (mm): 13 - Pathologic: Stage IV (M1) - Signed by Crawford Morna Pickle, NP on 12/20/2022    SUMMARY OF ONCOLOGIC HISTORY: Oncology History  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 Ki-67 was 70% HER-2 was amplified with a ratio 2.91   07/12/2012 - 07/17/2013 Neo-Adjuvant Chemotherapy   TCH 6 followed by Herceptin  maintenance   08/14/2012 Genetic Testing   Negative genetic testing on the [facility].  The report date is August 14, 2012.  The [facility] gene panel offered by [facility] includes sequencing and deletion/duplication testing of the following 25 genes: APC, ATM, , BARD1, BMPR1A, BRCA1, BRCA2, BRIP1, CHD1, CDK4, CDKN2A, CHEK2, EPCAM (large rearrangement only), MLH1, MSH2, MSH6, MUTYH, NBN, PALB2, PMS2, PTEN, RAD51C, RAD51D, SMAD4, STK11, and TP53.     12/11/2012 Surgery   Left breast lumpectomy: 1.8 cm tumor 1 positive sentinel node, axillary lymph node dissection 02/08/2013 showed 0/13 lymph nodes   03/25/2013 - 05/06/2013 Radiation Therapy   Adjuvant radiation therapy   06/05/2013 - 07/20/2017 Anti-estrogen oral therapy   Tamoxifen  20 mg  daily   07/27/2017 Relapse/Recurrence   MRI Brain: 3.4 x 2.9 x 2.9 cm RIGHT frontal lobe mass with imaging characteristics of solitary metastasis. Extensive vasogenic edema resulting in 9 mm RIGHT to LEFT midline shift. Equivocal very early LEFT ventricle entrapment.    08/04/2017 Surgery   Rt frontal brain resection: Poorly differentiated tumor IHC suggests breast primary ER and PR Positive   08/25/2017 - 09/04/2017 Radiation Therapy   Stereotactic radiation   09/18/2017 -  Anti-estrogen oral therapy   Lapatinib  with letrozole    12/18/2017 - 12/19/2017 Radiation Therapy   New right parietal lobe metastases status post [facility]   08/27/2018 - 08/27/2020 Radiation Therapy   SRS to new brain metastases   05/09/2019 Relapse/Recurrence   Interval increase in size of the enhancing nodular left internal mammary soft tissue 2.8 cm.  Redemonstrated enlarged supraclavicular, lower cervical and lower posterior cervical nodes unchanged.  Interval increase in the bony erosion of the posterior and lateral left third rib, increasing soft tissue lesion eroding the left sternal body 3.1 cm was 2.5 cm.  Bronchiectatic changes   06/19/2019 - 05/05/2020 Chemotherapy   ado-trastuzumab emtansine  (KADCYLA )     06/15/2020 -  Chemotherapy   Xeloda , Tucatinib , Herceptin     04/12/2022 - 09/06/2022 Chemotherapy   Patient is on Treatment Plan : BREAST Trastuzumab  IV (8/6) or SQ (600) D1 q21d     09/27/2022 -  Chemotherapy   Patient is on Treatment Plan : BREAST MAINTENANCE Trastuzumab  IV every 3 weeks (21 days)     11/08/2022 Miscellaneous   Adding low-dose neratinib  (80 mg) to Herceptin    12/20/2022 Cancer Staging   Staging form: Breast,  AJCC 7th Edition - Pathologic: Stage IV (M1) - Signed by Crawford Morna Pickle, NP on 12/20/2022   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  History of Present Illness Melissa [name] is a 58 year old female with metastatic HER2 positive breast cancer who presents for follow-up.  She experiences persistent fatigue, which has worsened over the past couple of months, particularly during her recent radiation therapy. She takes two-hour naps in the afternoon to manage the tiredness.  She is undergoing treatment with Herceptin  and Avastin , currently in her second cycle of Avastin . Her most recent echocardiogram on June 6th was normal.  She experiences low blood pressure and may have insufficient fluid intake, primarily drinking flavored water and occasionally Pepsi. She has symptoms of dehydration, such as dry lips, and feels cold frequently.  She has started using a plant-based supplement called Mullein, taking two drops under the tongue daily, which she feels has helped clear mucus from her lungs.     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   Port-A-Cath in place 08/05/2019   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/Initial Ca 1997, Brain  Mets 2019 06/10/2019   Mediastinal adenopathy    H/O coccidioidomycosis and Aspergillosus 05/16/2018   DOE (dyspnea on exertion) 05/03/2018   Diarrhea 04/22/2018   Cavitary lesion of lung in area of previous cyts in apex of Sup Segment of LLL 03/29/2018   Aortic atherosclerosis (HCC) 01/22/2018   Emphysema lung (HCC) 01/22/2018   Metastasis to brain (HCC) 08/04/2017   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 (HCC) 07/27/2017   Nicotine  abuse 07/27/2017   Migraines 07/27/2017   Upper airway cough syndrome 08/10/2016   Abnormal echocardiogram 08/07/2013   Chest tightness or pressure 08/07/2013   Hx of radiation therapy    Edema of left lower extremity 11/19/2012   Tachycardia 09/20/2012   Obstructive bronchiectasis (HCC) 01/26/2011   Cigarette smoker 01/26/2011    is allergic to aspirin, protonix  [pantoprazole ], doxycycline , promethazine -codeine , sulfamethoxazole-trimethoprim , and iodinated contrast media.  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[facility])    Coccidioidomycosis 11/05/2023   Complication of anesthesia    bp dropped + desat    COPD (chronic obstructive pulmonary disease) (HCC)    Dyspnea    DOE   GERD (gastroesophageal reflux disease)    H/O coccidioidomycosis    was reason for lung lobectomy   Headache(784.0)    due to eye strain or not eating   Hemoptysis 05/08/2023   History of anemia    no current problem   History of asthma    as a child   History of breast cancer 2014   left   History of chemotherapy    finished 07/17/2013   History of hiatal hernia    AGE 65   Hx of radiation therapy 03/25/13-05/06/13   left  breast 5000 cGy/25 sessions, left breast boost 1000 cGy/5 sessions   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   Personal history of chemotherapy    for liver cancer   Personal history of radiation therapy    Pneumonia    LAST FLARE UP 01/2018   Rash 02/22/2021   Runny nose 07/30/2013   clear drainage   Smoker 05/24/2021   Wears dentures    upper    SURGICAL HISTORY: Past Surgical History:  Procedure Laterality Date   APPLICATION OF CRANIAL NAVIGATION N/A 08/04/2017   Procedure: APPLICATION OF CRANIAL NAVIGATION;  Surgeon: [name], Morene Hicks, MD;  Location: [facility] OR;  Service: Neurosurgery;  Laterality: N/A;   AXILLARY LYMPH NODE DISSECTION Left 02/08/2013   Procedure: LEFT AXILLARY DISSECTION;  Surgeon: Morene ONEIDA Olives, MD;  Location: [facility];  Service: General;  Laterality: Left;   BREAST CYST EXCISION Right 2006   BREAST LUMPECTOMY Left 2014   BREAST LUMPECTOMY WITH NEEDLE LOCALIZATION AND AXILLARY SENTINEL LYMPH NODE BX Left 12/31/2012   Procedure: NEEDLE LOCALIZATION LEFT BREAST LUMPECTOMY AND LEFT AXILLARY SENTENIAL LYMPH NODE BX;  Surgeon: Morene ONEIDA Olives, MD;  Location: [facility];  Service: General;  Laterality: Left;   BRONCHIAL BRUSHINGS  03/17/2020   Procedure: BRONCHIAL BRUSHINGS;  Surgeon: Brenna Adine CROME, DO;  Location: [facility];  Service: Pulmonary;;   BRONCHIAL WASHINGS  03/17/2020   Procedure: BRONCHIAL WASHINGS;  Surgeon: Brenna Adine CROME, DO;  Location: [facility];  Service: Pulmonary;;   CESAREAN SECTION  1995/1996   CRANIOTOMY Right 08/04/2017   Procedure: Right Frontal craniotomy for resection of tumor with stereotactic navigation;  Surgeon: [name], Morene Hicks, MD;  Location: [facility] OR;  Service: Neurosurgery;  Laterality: Right;  Right Frontal craniotomy for resection of tumor with stereotactic navigation   IR CV LINE INJECTION  02/19/2021   IR CV LINE INJECTION  11/12/2021   IR IMAGING GUIDED PORT INSERTION  05/31/2019   LUNG LOBECTOMY Left  05/1996   upper lobe - due to [facility] Fever   PORT-A-CATH REMOVAL Right 08/02/2013   Procedure: REMOVAL PORT-A-CATH;  Surgeon: Morene ONEIDA Olives, MD;  Location: [facility];  Service: General;  Laterality: Right;   PORTACATH PLACEMENT  07/02/2012   Procedure: INSERTION PORT-A-CATH;  Surgeon: Morene ONEIDA Olives, MD;  Location: [facility];  Service: General;  Laterality: N/A;  right   VIDEO BRONCHOSCOPY N/A 03/17/2020   Procedure: VIDEO BRONCHOSCOPY;  Surgeon: Brenna Adine CROME, DO;  Location: [facility];  Service: Pulmonary;  Laterality: N/A;   VIDEO BRONCHOSCOPY WITH ENDOBRONCHIAL ULTRASOUND N/A 05/23/2018   Procedure: VIDEO BRONCHOSCOPY WITH ENDOBRONCHIAL ULTRASOUND;  Surgeon: Brenna Adine CROME, DO;  Location: [facility]  OR;  Service: Thoracic;  Laterality: N/A;    SOCIAL HISTORY: Social History   Socioeconomic History   Marital status: Married    Spouse name: Not on file   Number of children: 2   Years of education: Not on file   Highest education level: Not on file  Occupational History   Occupation: Investment banker, corporate  Tobacco Use   Smoking status: Every Day    Current packs/day: 0.30    Average packs/day: 0.3 packs/day for 38.0 years (11.4 ttl pk-yrs)    Types: Cigarettes   Smokeless tobacco: Never   Tobacco comments:    12 cigarettes a day-05/08/23  Vaping Use   Vaping status: Never Used  Substance and Sexual Activity   Alcohol use: No   Drug use: No   Sexual activity: Not Currently  Other Topics Concern   Not on file  Social History Narrative   Not on file   Social Drivers of Health   Financial Resource Strain: Low Risk  (01/03/2023)   Received from [facility]   Overall Financial Resource Strain (CARDIA)    Difficulty of Paying Living Expenses: Not hard at all  Food Insecurity: No Food Insecurity (12/20/2023)   Hunger Vital Sign    Worried About Running Out of Food in the Last Year: Never true    Ran Out of Food in the Last  Year: Never true  Transportation Needs: No Transportation Needs (12/20/2023)   PRAPARE - Administrator, Civil Service (Medical): No    Lack of Transportation (Non-Medical): No  Physical Activity: Not on file  Stress: Not on file  Social Connections: Not on file  Intimate Partner Violence: Not At Risk (12/20/2023)   Humiliation, Afraid, Rape, and Kick questionnaire    Fear of Current or Ex-Partner: No    Emotionally Abused: No    Physically Abused: No    Sexually Abused: No    FAMILY HISTORY: Family History  Problem Relation Age of Onset   Cancer Mother        breast   Emphysema Mother        was a smoker   Heart disease Mother    Melanoma Mother        dx in her 12s   Breast cancer Mother 60   Asthma Brother    Cancer Cousin        breast   Breast cancer Cousin 20 - 23       mother's maternal cousin    Review of Systems  Constitutional:  Negative for appetite change, chills, fatigue, fever and unexpected weight change.  HENT:   Negative for hearing loss, lump/mass and trouble swallowing.   Eyes:  Negative for eye problems and icterus.  Respiratory:  Negative for chest tightness, cough and shortness of breath.   Cardiovascular:  Negative for chest pain, leg swelling and palpitations.  Gastrointestinal:  Negative for abdominal distention, abdominal pain, constipation, diarrhea, nausea and vomiting.  Endocrine: Negative for hot flashes.  Genitourinary:  Negative for difficulty urinating.   Musculoskeletal:  Negative for arthralgias.  Skin:  Negative for itching and rash.  Neurological:  Negative for dizziness, extremity weakness, headaches and numbness.  Hematological:  Negative for adenopathy. Does not bruise/bleed easily.  Psychiatric/Behavioral:  Negative for depression. The patient is not nervous/anxious.       PHYSICAL EXAMINATION   Onc Performance Status - 02/20/24 1330       ECOG Perf Status   ECOG Perf Status Restricted  in physically strenuous  activity but ambulatory and able to carry out work of a light or sedentary nature, e.g., light house work, office work      KPS SCALE   KPS % SCORE Normal activity with effort, some s/s of disease          Vitals:   02/20/24 1330  BP: (!) 92/47  Pulse: 82  Resp: 18  Temp: 97.6 F (36.4 C)  SpO2: 98%    Physical Exam Constitutional:      General: She is not in acute distress.    Appearance: Normal appearance. She is not toxic-appearing.  HENT:     Head: Normocephalic and atraumatic.     Mouth/Throat:     Mouth: Mucous membranes are moist.     Pharynx: Oropharynx is clear. No oropharyngeal exudate or posterior oropharyngeal erythema.  Eyes:     General: No scleral icterus. Cardiovascular:     Rate and Rhythm: Normal rate and regular rhythm.     Pulses: Normal pulses.     Heart sounds: Normal heart sounds.  Pulmonary:     Effort: Pulmonary effort is normal.     Breath sounds: Wheezing present.  Abdominal:     General: Abdomen is flat. Bowel sounds are normal. There is no distension.     Palpations: Abdomen is soft.     Tenderness: There is no abdominal tenderness.  Musculoskeletal:        General: No swelling.     Cervical back: Neck supple.  Lymphadenopathy:     Cervical: No cervical adenopathy.  Skin:    General: Skin is warm and dry.     Findings: No rash.  Neurological:     General: No focal deficit present.     Mental Status: She is alert.  Psychiatric:        Mood and Affect: Mood normal.        Behavior: Behavior normal.     LABORATORY DATA:  CBC    Component Value Date/Time   WBC 10.8 (H) 02/20/2024 1303   WBC 11.4 (H) 03/28/2023 1051   RBC 3.62 (L) 02/20/2024 1303   HGB 9.2 (L) 02/20/2024 1303   HGB 13.1 08/12/2014 0955   HCT 28.6 (L) 02/20/2024 1303   HCT 40.4 08/12/2014 0955   PLT 399 02/20/2024 1303   PLT 228 08/12/2014 0955   MCV 79.0 (L) 02/20/2024 1303   MCV 98.2 08/12/2014 0955   MCH 25.4 (L) 02/20/2024 1303   MCHC 32.2  02/20/2024 1303   RDW 17.4 (H) 02/20/2024 1303   RDW 13.0 08/12/2014 0955   LYMPHSABS 2.0 02/20/2024 1303   LYMPHSABS 2.0 08/12/2014 0955   MONOABS 0.9 02/20/2024 1303   MONOABS 0.5 08/12/2014 0955   EOSABS 0.7 (H) 02/20/2024 1303   EOSABS 0.2 08/12/2014 0955   BASOSABS 0.1 02/20/2024 1303   BASOSABS 0.0 08/12/2014 0955    CMP     Component Value Date/Time   NA 136 01/30/2024 1311   NA 142 08/12/2014 0956   K 4.3 01/30/2024 1311   K 4.6 08/12/2014 0956   CL 101 01/30/2024 1311   CL 100 12/20/2012 1509   CO2 30 01/30/2024 1311   CO2 26 08/12/2014 0956   GLUCOSE 81 01/30/2024 1311   GLUCOSE 94 08/12/2014 0956   GLUCOSE 98 12/20/2012 1509   BUN 7 01/30/2024 1311   BUN 10.1 08/12/2014 0956   CREATININE 0.64 01/30/2024 1311   CREATININE 0.83 03/28/2023 1051   CREATININE 0.9 08/12/2014 0956  CALCIUM 9.3 01/30/2024 1311   CALCIUM 9.3 08/12/2014 0956   PROT 7.4 01/30/2024 1311   PROT 7.1 08/12/2014 0956   ALBUMIN 3.3 (L) 01/30/2024 1311   ALBUMIN 3.8 08/12/2014 0956   AST 10 (L) 01/30/2024 1311   AST 15 08/12/2014 0956   ALT 7 01/30/2024 1311   ALT 11 08/12/2014 0956   ALKPHOS 93 01/30/2024 1311   ALKPHOS 68 08/12/2014 0956   BILITOT 0.2 01/30/2024 1311   BILITOT 0.25 08/12/2014 0956   GFRNONAA >60 01/30/2024 1311   GFRNONAA 76 03/11/2019 1057   GFRAA >60 03/24/2020 1012   GFRAA >60 02/11/2020 0836   GFRAA 88 03/11/2019 1057     ASSESSMENT and THERAPY PLAN:   Assessment and Plan Assessment & Plan Metastatic HER2 positive breast cancer Undergoing Herceptin  and Avastin . Echocardiogram normal. Brain scan scheduled for 04/16/2024.SABRA Fatigue possibly from radiation and low hemoglobin. - Continue Herceptin  and Avastin  therapy. - Schedule brain scan for October 7th.  Fatigue related to cancer therapy Persistent fatigue likely from radiation and low hemoglobin. Takes naps and feels cold.  Hypotension and dehydration Low blood pressure and dehydration symptoms.  Inadequate water intake noted. Dehydration may worsen hypotension and fatigue. - Administer intravenous fluids to address dehydration. - Encouraged continued water intake  RTC in 3 weeks for labs, f/u, and treatment.       All questions were answered. The patient knows to call the clinic with any problems, questions or concerns. We can certainly see the patient much sooner if necessary.  Total encounter time:30 minutes*in face-to-face visit time, chart review, lab review, care coordination, order entry, and documentation of the encounter time.    Morna Kendall, NP 02/20/24 1:58 PM Medical Oncology and Hematology [facility] [address] [place], [place] [address] Tel. [phone]    Fax. [phone]  *Total Encounter Time as defined by the Centers for Medicare and Medicaid Services includes, in addition to the face-to-face time of a patient visit (documented in the note above) non-face-to-face time: obtaining and reviewing outside history, ordering and reviewing medications, tests or procedures, care coordination (communications with other health care professionals or caregivers) and documentation in the medical record.

## 2024-02-20 NOTE — Patient Instructions (Signed)
 [facility] - A DEPT OF [facility]  Discharge Instructions: Thank you for choosing [facility] to provide your oncology and hematology care.   If you have a lab appointment with the Cancer Center, please go directly to the Cancer Center and check in at the registration area.   Wear comfortable clothing and clothing appropriate for easy access to any Portacath or PICC line.   We strive to give you quality time with your provider. You may need to reschedule your appointment if you arrive late (15 or more minutes).  Arriving late affects you and other patients whose appointments are after yours.  Also, if you miss three or more appointments without notifying the office, you may be dismissed from the clinic at the provider's discretion.      For prescription refill requests, have your pharmacy contact our office and allow 72 hours for refills to be completed.    Today you received the following chemotherapy and/or immunotherapy agents: MVASI /Kanjinti       To help prevent nausea and vomiting after your treatment, we encourage you to take your nausea medication as directed.  BELOW ARE SYMPTOMS THAT SHOULD BE REPORTED IMMEDIATELY: *FEVER GREATER THAN 100.4 F (38 C) OR HIGHER *CHILLS OR SWEATING *NAUSEA AND VOMITING THAT IS NOT CONTROLLED WITH YOUR NAUSEA MEDICATION *UNUSUAL SHORTNESS OF BREATH *UNUSUAL BRUISING OR BLEEDING *URINARY PROBLEMS (pain or burning when urinating, or frequent urination) *BOWEL PROBLEMS (unusual diarrhea, constipation, pain near the anus) TENDERNESS IN MOUTH AND THROAT WITH OR WITHOUT PRESENCE OF ULCERS (sore throat, sores in mouth, or a toothache) UNUSUAL RASH, SWELLING OR PAIN  UNUSUAL VAGINAL DISCHARGE OR ITCHING   Items with * indicate a potential emergency and should be followed up as soon as possible or go to the Emergency Department if any problems should occur.  Please show the CHEMOTHERAPY ALERT CARD or  IMMUNOTHERAPY ALERT CARD at check-in to the Emergency Department and triage nurse.  Should you have questions after your visit or need to cancel or reschedule your appointment, please contact [facility] - A DEPT OF [facility]  Dept: [phone]  and follow the prompts.  Office hours are 8:00 a.m. to 4:30 p.m. Monday - Friday. Please note that voicemails left after 4:00 p.m. may not be returned until the following business day.  We are closed weekends and major holidays. You have access to a nurse at all times for urgent questions. Please call the main number to the clinic Dept: [phone] and follow the prompts.   For any non-urgent questions, you may also contact your provider using MyChart. We now offer e-Visits for anyone 41 and older to request care online for non-urgent symptoms. For details visit [email or web address].   Also download the MyChart app! Go to the app store, search MyChart, open the app, select [place], and log in with your MyChart username and password.

## 2024-02-20 NOTE — Progress Notes (Signed)
 Nutrition follow-up completed with patient prior to infusion.  Patient receives Herceptin  and Avastin  for metastatic breast cancer with brain mets in 2019.  Weight: 100 pounds 11.2 ounces August 12 103 pounds July 22 106 pounds 4.8 ounces July 1  Labs reviewed.  Reports increased fatigue especially after radiation therapy.  She feels guilty when she needs to take a 2-3-hour nap in the afternoon.  She had increased oral nutrition supplements to 3 cartons daily.  Now reports some intolerance to them and wonders if she was drinking too many.  She has reduced consumption to 1 carton daily.  She still believes she is eating normally for her.  Nutrition diagnosis: Unintended weight loss, ongoing.  Intervention: Encouraged patient to find additional high-calorie foods to include daily. Increase oral nutrition supplements as tolerated. Consume frequent small snacks and increased fluids to help with fatigue. Provided additional samples.  Monitoring, evaluation, goals: Patient will tolerate increased calories and protein to minimize weight loss.  Next visit: Tuesday, September 23 during infusion.  Patient has RD contact information for questions before next visit.  **Disclaimer: This note was dictated with voice recognition software. Similar sounding words can inadvertently be transcribed and this note may contain transcription errors which may not have been corrected upon publication of note.**

## 2024-02-28 ENCOUNTER — Other Ambulatory Visit: Payer: Self-pay | Admitting: Hematology and Oncology

## 2024-02-28 MED ORDER — HYDROCOD POLI-CHLORPHE POLI ER 10-8 MG/5ML PO SUER: 5.0000 mL | Freq: Two times a day (BID) | ORAL | 0 refills | Status: DC

## 2024-03-10 DIAGNOSIS — C50919 Malignant neoplasm of unspecified site of unspecified female breast: Secondary | ICD-10-CM | POA: Diagnosis not present

## 2024-03-10 DIAGNOSIS — E7800 Pure hypercholesterolemia, unspecified: Secondary | ICD-10-CM | POA: Diagnosis not present

## 2024-03-12 ENCOUNTER — Other Ambulatory Visit (HOSPITAL_COMMUNITY): Payer: Self-pay

## 2024-03-12 ENCOUNTER — Inpatient Hospital Stay (HOSPITAL_BASED_OUTPATIENT_CLINIC_OR_DEPARTMENT_OTHER): Admitting: Adult Health

## 2024-03-12 ENCOUNTER — Encounter: Payer: Self-pay | Admitting: Adult Health

## 2024-03-12 ENCOUNTER — Inpatient Hospital Stay

## 2024-03-12 ENCOUNTER — Inpatient Hospital Stay: Attending: Hematology and Oncology

## 2024-03-12 VITALS — BP 93/51 | HR 55 | Temp 97.5°F | Resp 15 | Wt 98.8 lb

## 2024-03-12 DIAGNOSIS — Z5112 Encounter for antineoplastic immunotherapy: Secondary | ICD-10-CM | POA: Diagnosis not present

## 2024-03-12 DIAGNOSIS — C50212 Malignant neoplasm of upper-inner quadrant of left female breast: Secondary | ICD-10-CM | POA: Insufficient documentation

## 2024-03-12 DIAGNOSIS — Z79899 Other long term (current) drug therapy: Secondary | ICD-10-CM | POA: Diagnosis not present

## 2024-03-12 DIAGNOSIS — Z171 Estrogen receptor negative status [ER-]: Secondary | ICD-10-CM | POA: Diagnosis not present

## 2024-03-12 DIAGNOSIS — Z1731 Human epidermal growth factor receptor 2 positive status: Secondary | ICD-10-CM | POA: Insufficient documentation

## 2024-03-12 DIAGNOSIS — C7931 Secondary malignant neoplasm of brain: Secondary | ICD-10-CM | POA: Insufficient documentation

## 2024-03-12 DIAGNOSIS — C50912 Malignant neoplasm of unspecified site of left female breast: Secondary | ICD-10-CM

## 2024-03-12 DIAGNOSIS — C787 Secondary malignant neoplasm of liver and intrahepatic bile duct: Secondary | ICD-10-CM | POA: Diagnosis not present

## 2024-03-12 DIAGNOSIS — Z170 Estrogen receptor positive status [ER+]: Secondary | ICD-10-CM | POA: Insufficient documentation

## 2024-03-12 DIAGNOSIS — C7951 Secondary malignant neoplasm of bone: Secondary | ICD-10-CM | POA: Diagnosis not present

## 2024-03-12 DIAGNOSIS — Z1721 Progesterone receptor positive status: Secondary | ICD-10-CM | POA: Insufficient documentation

## 2024-03-12 DIAGNOSIS — Z95828 Presence of other vascular implants and grafts: Secondary | ICD-10-CM

## 2024-03-12 LAB — CBC WITH DIFFERENTIAL (CANCER CENTER ONLY)
Abs Immature Granulocytes: 0.03 K/uL (ref 0.00–0.07)
Basophils Absolute: 0.1 K/uL (ref 0.0–0.1)
Basophils Relative: 1 %
Eosinophils Absolute: 0.6 K/uL — ABNORMAL HIGH (ref 0.0–0.5)
Eosinophils Relative: 7 %
HCT: 29.5 % — ABNORMAL LOW (ref 36.0–46.0)
Hemoglobin: 9.4 g/dL — ABNORMAL LOW (ref 12.0–15.0)
Immature Granulocytes: 0 %
Lymphocytes Relative: 21 %
Lymphs Abs: 2 K/uL (ref 0.7–4.0)
MCH: 25.1 pg — ABNORMAL LOW (ref 26.0–34.0)
MCHC: 31.9 g/dL (ref 30.0–36.0)
MCV: 78.7 fL — ABNORMAL LOW (ref 80.0–100.0)
Monocytes Absolute: 0.9 K/uL (ref 0.1–1.0)
Monocytes Relative: 10 %
Neutro Abs: 5.9 K/uL (ref 1.7–7.7)
Neutrophils Relative %: 61 %
Platelet Count: 411 K/uL — ABNORMAL HIGH (ref 150–400)
RBC: 3.75 MIL/uL — ABNORMAL LOW (ref 3.87–5.11)
RDW: 17.7 % — ABNORMAL HIGH (ref 11.5–15.5)
WBC Count: 9.5 K/uL (ref 4.0–10.5)
nRBC: 0 % (ref 0.0–0.2)

## 2024-03-12 LAB — CMP (CANCER CENTER ONLY)
ALT: 7 U/L (ref 0–44)
AST: 9 U/L — ABNORMAL LOW (ref 15–41)
Albumin: 3.3 g/dL — ABNORMAL LOW (ref 3.5–5.0)
Alkaline Phosphatase: 82 U/L (ref 38–126)
Anion gap: 6 (ref 5–15)
BUN: 6 mg/dL (ref 6–20)
CO2: 28 mmol/L (ref 22–32)
Calcium: 9.5 mg/dL (ref 8.9–10.3)
Chloride: 102 mmol/L (ref 98–111)
Creatinine: 0.55 mg/dL (ref 0.44–1.00)
GFR, Estimated: >60 mL/min (ref >60)
Glucose, Bld: 62 mg/dL — ABNORMAL LOW (ref 70–99)
Potassium: 4.1 mmol/L (ref 3.5–5.1)
Sodium: 136 mmol/L (ref 135–145)
Total Bilirubin: 0.2 mg/dL (ref 0.0–1.2)
Total Protein: 7.5 g/dL (ref 6.5–8.1)

## 2024-03-12 LAB — TOTAL PROTEIN, URINE DIPSTICK: Protein, ur: NEGATIVE mg/dL

## 2024-03-12 MED ORDER — SODIUM CHLORIDE 0.9% FLUSH
10.0000 mL | INTRAVENOUS | Status: DC | PRN
  Administered 2024-03-12: 10 mL via INTRAVENOUS

## 2024-03-12 MED ORDER — SODIUM CHLORIDE 0.9 % IV SOLN
7.5000 mg/kg | Freq: Once | INTRAVENOUS | Status: AC
  Administered 2024-03-12: 350 mg via INTRAVENOUS
  Filled 2024-03-12: qty 14

## 2024-03-12 MED ORDER — DIPHENHYDRAMINE HCL 25 MG PO CAPS
25.0000 mg | ORAL_CAPSULE | Freq: Once | ORAL | Status: AC
  Administered 2024-03-12: 25 mg via ORAL
  Filled 2024-03-12: qty 1

## 2024-03-12 MED ORDER — TRASTUZUMAB-ANNS CHEMO 150 MG IV SOLR
6.0000 mg/kg | Freq: Once | INTRAVENOUS | Status: AC
  Administered 2024-03-12: 300 mg via INTRAVENOUS
  Filled 2024-03-12: qty 14.29

## 2024-03-12 MED ORDER — ACETAMINOPHEN 325 MG PO TABS
650.0000 mg | ORAL_TABLET | Freq: Once | ORAL | Status: AC
Start: 2024-03-12 — End: 2024-03-12
  Administered 2024-03-12: 650 mg via ORAL
  Filled 2024-03-12: qty 2

## 2024-03-12 MED ORDER — SODIUM CHLORIDE 0.9 % IV SOLN
Freq: Once | INTRAVENOUS | Status: AC
Start: 2024-03-12 — End: 2024-03-12

## 2024-03-12 MED ORDER — DEXAMETHASONE 2 MG PO TABS
2.0000 mg | ORAL_TABLET | Freq: Every day | ORAL | 0 refills | Status: AC
  Filled 2024-03-12: qty 90, 90d supply, fill #0

## 2024-03-12 NOTE — Assessment & Plan Note (Signed)
 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  SRS brain: 08/25/2017-09/04/2017 2. Anti Her 2 therapy with Lapatinib  started 09/17/2017-05/14/2019: Stopped for progression 3.  I discontinued tamoxifen  and started her on letrozole  2.5 mg daily.  05/14/2019 stopped for progression 4.  Stereotactic radiosurgery 12/19/2017 to the new right parietal lobe metastases. 5.  Kadcyla : Received 16 cycles discontinued 05/05/2020 6.  Tucatinib  Xeloda : Discontinued because of hand-foot syndrome 06/11/2020-12/15/2020 7. Herceptin /Neratinib : unable to tolerate due to diarrhea and weight loss 8.  SRS to new brain mets 10/01/2021 9.  Resection of brain metastases at [facility] 12/29/2022: Metastatic breast cancer proc panel pending 10.  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 Recent imaging and echocardiogram satisfactory--repeat CT C/A/P and echo in 06/2024. No blood pressure or proteinuria issues. Slower healing due to Avastin --though no current issues noted. - Continue Herceptin  and Avastin  treatment. - Monitor blood pressure and urine protein regularly. - Monitor for signs of bleeding or delayed wound healing. - Continue to f/u with Dr. Buckley regarding brain metastases and managment  Unintentional weight loss and poor appetite Experiencing poor appetite and weight loss. Discussed appetite stimulant antidepressant considering interactions with her current medications. Notes she was not on Keppra  or Tussionex when she was taking Dexamethasone  the last time.   - Prescribe Dexamethasone  2mg  daily. - If causes sleep pattern disturbance, will try Mirtazapine QHS.  Hypoglycemia Blood sugar low at 62. Discussed risks of blood sugar being decreased.  Hopeful that Dexamethasone  will increase blood sugar. - Dexamethasone  to be delivered by [facility] pharmacist to patient in infusion. - Monitor blood sugar levels closely.  Dehydration Darker yellow urine indicates insufficient hydration. Previous fluids improved condition. - Encourage increased oral fluid intake to achieve lighter urine color. - Will administer one liter of normal saline during treatment  with every treatment moving forward.  RTC in 3 weeks for labs, f/u, and her next treatment.

## 2024-03-12 NOTE — Progress Notes (Signed)
 [facility] Cancer Follow up:    [name], MD 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 ---s/p Lumpectomy/ /Initial Ca 1997, Brain Mets 2019 Staging form: Breast, AJCC 7th Edition - Clinical: Stage IIA (T2, N0, cM0) - Unsigned Histopathologic type: 9931 Laterality: Left Tumor size (mm): 13 Staging comments: Staged at breast conference 12.4.13  - Pathologic: Stage IIA (T1c, N1, cM0) - Unsigned Histopathologic type: 9931 Laterality: Left Tumor size (mm): 13 - Pathologic: Stage IV (M1) - Signed by Crawford Morna Pickle, NP on 12/20/2022    SUMMARY OF ONCOLOGIC HISTORY: Oncology History  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 Ki-67 was 70% HER-2 was amplified with a ratio 2.91   07/12/2012 - 07/17/2013 Neo-Adjuvant Chemotherapy   TCH 6 followed by Herceptin  maintenance   08/14/2012 Genetic Testing   Negative genetic testing on the [facility].  The report date is August 14, 2012.  The [facility] gene panel offered by [facility] includes sequencing and deletion/duplication testing of the following 25 genes: APC, ATM, , BARD1, BMPR1A, BRCA1, BRCA2, BRIP1, CHD1, CDK4, CDKN2A, CHEK2, EPCAM (large rearrangement only), MLH1, MSH2, MSH6, MUTYH, NBN, PALB2, PMS2, PTEN, RAD51C, RAD51D, SMAD4, STK11, and TP53.     12/11/2012 Surgery   Left breast lumpectomy: 1.8 cm tumor 1 positive sentinel node, axillary lymph node dissection 02/08/2013 showed 0/13 lymph nodes   03/25/2013 - 05/06/2013 Radiation Therapy   Adjuvant radiation therapy   06/05/2013 - 07/20/2017 Anti-estrogen oral therapy   Tamoxifen  20 mg  daily   07/27/2017 Relapse/Recurrence   MRI Brain: 3.4 x 2.9 x 2.9 cm RIGHT frontal lobe mass with imaging characteristics of solitary metastasis. Extensive vasogenic edema resulting in 9 mm RIGHT to LEFT midline shift. Equivocal very early LEFT ventricle entrapment.    08/04/2017 Surgery   Rt frontal brain resection: Poorly differentiated tumor IHC suggests breast primary ER and PR Positive   08/25/2017 - 09/04/2017 Radiation Therapy   Stereotactic radiation   09/18/2017 -  Anti-estrogen oral therapy   Lapatinib  with letrozole    12/18/2017 - 12/19/2017 Radiation Therapy   New right parietal lobe metastases status post [facility]   08/27/2018 - 08/27/2020 Radiation Therapy   SRS to new brain metastases   05/09/2019 Relapse/Recurrence   Interval increase in size of the enhancing nodular left internal mammary soft tissue 2.8 cm.  Redemonstrated enlarged supraclavicular, lower cervical and lower posterior cervical nodes unchanged.  Interval increase in the bony erosion of the posterior and lateral left third rib, increasing soft tissue lesion eroding the left sternal body 3.1 cm was 2.5 cm.  Bronchiectatic changes   06/19/2019 - 05/05/2020 Chemotherapy   ado-trastuzumab emtansine  (KADCYLA )     06/15/2020 -  Chemotherapy   Xeloda , Tucatinib , Herceptin     04/12/2022 - 09/06/2022 Chemotherapy   Patient is on Treatment Plan : BREAST Trastuzumab  IV (8/6) or SQ (600) D1 q21d     09/27/2022 -  Chemotherapy   Patient is on Treatment Plan : BREAST MAINTENANCE Trastuzumab  IV every 3 weeks (21 days)     11/08/2022 Miscellaneous   Adding low-dose neratinib  (80 mg) to Herceptin    12/20/2022 Cancer Staging   Staging form:  Breast, AJCC 7th Edition - Pathologic: Stage IV (M1) - Signed by Crawford Morna Pickle, NP on 12/20/2022   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  History of Present Illness Melissa [name] is a 58 year old female who presents for follow-up and evaluation prior to receiving Herceptin  and Avastin .  She recently had a CT scan of the chest, abdomen, and pelvis, and an echocardiogram on June 27th as part of her routine evaluations. Her urine protein is negative, and her blood pressure is slightly low. An incident of oxygen saturation at 80% was noted, likely due to cold fingers, but it returned to 98% upon rechecking. She has no new pain or lung issues and uses 'Mullin' drops to help clear mucus.  She struggles with appetite issues and is trying to gain weight. Mallie Pinion nutrition drinks upset her stomach when consumed twice daily. She is on Lexapro, Keppra , and Tussionex which cause mild drowsiness. Her blood sugar was recorded at 62, indicating hypoglycemia. Despite adequate water intake, her urine is darker yellow, suggesting possible dehydration. Her creatinine is normal today.  She notes feeling better after receiving fluids with her last treatment and wonders if she can do that today.       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   Port-A-Cath in place 08/05/2019   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 /Initial Ca 1997, Brain Mets 2019 06/10/2019   Mediastinal adenopathy    H/O coccidioidomycosis and Aspergillosus 05/16/2018   DOE (dyspnea on exertion) 05/03/2018   Diarrhea 04/22/2018   Cavitary lesion of lung in area of previous cyts in apex of Sup Segment of LLL 03/29/2018   Aortic atherosclerosis (HCC) 01/22/2018   Emphysema lung (HCC) 01/22/2018   Metastasis to brain (HCC) 08/04/2017   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 (HCC) 07/27/2017   Nicotine  abuse 07/27/2017   Migraines 07/27/2017   Upper airway cough syndrome 08/10/2016   Abnormal echocardiogram 08/07/2013   Chest tightness or pressure 08/07/2013   Hx of radiation therapy    Edema of left lower extremity 11/19/2012   Tachycardia 09/20/2012   Obstructive bronchiectasis (HCC) 01/26/2011   Cigarette smoker 01/26/2011    is allergic to aspirin, protonix  [pantoprazole ], doxycycline , promethazine -codeine , sulfamethoxazole-trimethoprim , and iodinated contrast media.  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[facility])    Coccidioidomycosis 11/05/2023   Complication of anesthesia    bp dropped + desat    COPD (chronic obstructive pulmonary disease) (HCC)    Dyspnea    DOE   GERD (gastroesophageal reflux disease)    H/O coccidioidomycosis    was reason for lung lobectomy   Headache(784.0)    due to eye strain or not eating   Hemoptysis 05/08/2023   History of anemia    no current problem   History  of asthma    as a child   History of breast cancer 2014   left   History of chemotherapy    finished 07/17/2013   History of hiatal hernia    AGE 45   Hx of radiation therapy 03/25/13-05/06/13   left breast 5000 cGy/25 sessions, left breast boost 1000 cGy/5 sessions   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   Personal history of chemotherapy    for liver cancer   Personal history of radiation therapy    Pneumonia    LAST FLARE UP 01/2018   Rash 02/22/2021   Runny nose 07/30/2013   clear drainage   Smoker 05/24/2021   Wears dentures    upper    SURGICAL HISTORY: Past Surgical History:  Procedure Laterality Date   APPLICATION OF CRANIAL NAVIGATION N/A 08/04/2017   Procedure: APPLICATION OF CRANIAL NAVIGATION;  Surgeon: [name], Morene Hicks, MD;  Location: [facility];  Service: Neurosurgery;  Laterality: N/A;   AXILLARY LYMPH NODE DISSECTION Left 02/08/2013   Procedure: LEFT AXILLARY DISSECTION;  Surgeon: Morene ONEIDA Olives, MD;  Location: [facility];  Service: General;  Laterality: Left;   BREAST CYST EXCISION Right 2006   BREAST LUMPECTOMY Left 2014   BREAST LUMPECTOMY WITH NEEDLE LOCALIZATION AND AXILLARY SENTINEL LYMPH NODE BX Left 12/31/2012   Procedure: NEEDLE LOCALIZATION LEFT BREAST LUMPECTOMY AND LEFT AXILLARY SENTENIAL LYMPH NODE BX;  Surgeon: Morene ONEIDA Olives, MD;  Location: [facility];  Service: General;  Laterality: Left;   BRONCHIAL BRUSHINGS  03/17/2020   Procedure: BRONCHIAL BRUSHINGS;  Surgeon: Brenna Adine CROME, DO;  Location: [facility];  Service: Pulmonary;;   BRONCHIAL WASHINGS  03/17/2020   Procedure: BRONCHIAL WASHINGS;  Surgeon: Brenna Adine CROME, DO;  Location: [facility];  Service: Pulmonary;;   CESAREAN SECTION  1995/1996   CRANIOTOMY Right 08/04/2017   Procedure: Right Frontal craniotomy for resection of tumor with stereotactic navigation;  Surgeon: [name], Morene Hicks, MD;  Location: [facility] OR;  Service: Neurosurgery;  Laterality: Right;  Right Frontal  craniotomy for resection of tumor with stereotactic navigation   IR CV LINE INJECTION  02/19/2021   IR CV LINE INJECTION  11/12/2021   IR IMAGING GUIDED PORT INSERTION  05/31/2019   LUNG LOBECTOMY Left 05/1996   upper lobe - due to [facility] Fever   PORT-A-CATH REMOVAL Right 08/02/2013   Procedure: REMOVAL PORT-A-CATH;  Surgeon: Morene ONEIDA Olives, MD;  Location: [facility];  Service: General;  Laterality: Right;   PORTACATH PLACEMENT  07/02/2012   Procedure: INSERTION PORT-A-CATH;  Surgeon: Morene ONEIDA Olives, MD;  Location: [facility];  Service: General;  Laterality: N/A;  right   VIDEO BRONCHOSCOPY N/A 03/17/2020  Procedure: VIDEO BRONCHOSCOPY;  Surgeon: Brenna Adine CROME, DO;  [facility]: [facility];  Service: Pulmonary;  Laterality: N/A;   VIDEO BRONCHOSCOPY WITH ENDOBRONCHIAL ULTRASOUND N/A 05/23/2018   Procedure: VIDEO BRONCHOSCOPY WITH ENDOBRONCHIAL ULTRASOUND;  Surgeon: Brenna Adine CROME, DO;  Location: [facility];  Service: Thoracic;  Laterality: N/A;    SOCIAL HISTORY: Social History   Socioeconomic History   Marital status: Married    Spouse name: Not on file   Number of children: 2   Years of education: Not on file   Highest education level: Not on file  Occupational History   Occupation: Investment banker, corporate  Tobacco Use   Smoking status: Every Day    Current packs/day: 0.30    Average packs/day: 0.3 packs/day for 38.0 years (11.4 ttl pk-yrs)    Types: Cigarettes   Smokeless tobacco: Never   Tobacco comments:    12 cigarettes a day-05/08/23  Vaping Use   Vaping status: Never Used  Substance and Sexual Activity   Alcohol use: No   Drug use: No   Sexual activity: Not Currently  Other Topics Concern   Not on file  Social History Narrative   Not on file   Social Drivers of Health   Financial Resource Strain: Low Risk  (01/03/2023)   Received from [facility]   Overall Financial Resource Strain (CARDIA)    Difficulty of Paying  Living Expenses: Not hard at all  Food Insecurity: No Food Insecurity (12/20/2023)   Hunger Vital Sign    Worried About Running Out of Food in the Last Year: Never true    Ran Out of Food in the Last Year: Never true  Transportation Needs: No Transportation Needs (12/20/2023)   PRAPARE - Administrator, Civil Service (Medical): No    Lack of Transportation (Non-Medical): No  Physical Activity: Not on file  Stress: Not on file  Social Connections: Not on file  Intimate Partner Violence: Not At Risk (12/20/2023)   Humiliation, Afraid, Rape, and Kick questionnaire    Fear of Current or Ex-Partner: No    Emotionally Abused: No    Physically Abused: No    Sexually Abused: No    FAMILY HISTORY: Family History  Problem Relation Age of Onset   Cancer Mother        breast   Emphysema Mother        was a smoker   Heart disease Mother    Melanoma Mother        dx in her 73s   Breast cancer Mother 86   Asthma Brother    Cancer Cousin        breast   Breast cancer Cousin 20 - 28       mother's maternal cousin    Review of Systems  Constitutional:  Positive for appetite change and fatigue. Negative for chills, fever and unexpected weight change.  HENT:   Negative for hearing loss, lump/mass, mouth sores and trouble swallowing.   Eyes:  Negative for eye problems and icterus.  Respiratory:  Positive for cough. Negative for chest tightness and shortness of breath.   Cardiovascular:  Negative for chest pain, leg swelling and palpitations.  Gastrointestinal:  Negative for abdominal distention, abdominal pain, constipation, diarrhea, nausea and vomiting.  Endocrine: Negative for hot flashes.  Genitourinary:  Negative for difficulty urinating.   Musculoskeletal:  Negative for arthralgias.  Skin:  Negative for itching and rash.  Neurological:  Negative for dizziness, extremity weakness, headaches and numbness.  Hematological:  Negative for adenopathy. Does not bruise/bleed easily.   Psychiatric/Behavioral:  Negative for depression. The patient is not nervous/anxious.       PHYSICAL EXAMINATION   Onc Performance Status - 03/12/24 0928       ECOG Perf Status   ECOG Perf Status Restricted in physically strenuous activity but ambulatory and able to carry out work of a light or sedentary nature, e.g., light house work, office work      KPS SCALE   KPS % SCORE Normal activity with effort, some s/s of disease          Vitals:   03/12/24 0920 03/12/24 1004  BP: (!) 93/51   Pulse: (!) 55   Resp: 15   Temp: (!) 97.5 F (36.4 C)   SpO2: (!) 80% 98%  Oxygen saturation recheck and 98%  Physical Exam Constitutional:      General: She is not in acute distress.    Appearance: Normal appearance. She is not toxic-appearing.  HENT:     Head: Normocephalic and atraumatic.     Mouth/Throat:     Mouth: Mucous membranes are moist.     Pharynx: Oropharynx is clear. No oropharyngeal exudate or posterior oropharyngeal erythema.  Eyes:     General: No scleral icterus. Cardiovascular:     Rate and Rhythm: Normal rate and regular rhythm.     Pulses: Normal pulses.     Heart sounds: Normal heart sounds.  Pulmonary:     Effort: Pulmonary effort is normal.     Breath sounds: Normal breath sounds.  Abdominal:     General: Abdomen is flat. Bowel sounds are normal. There is no distension.     Palpations: Abdomen is soft.     Tenderness: There is no abdominal tenderness.  Musculoskeletal:        General: No swelling.     Cervical back: Neck supple.  Lymphadenopathy:     Cervical: No cervical adenopathy.  Skin:    General: Skin is warm and dry.     Findings: No rash.  Neurological:     General: No focal deficit present.     Mental Status: She is alert.  Psychiatric:        Mood and Affect: Mood normal.        Behavior: Behavior normal.     LABORATORY DATA:  CBC    Component Value Date/Time   WBC 9.5 03/12/2024 0848   WBC 11.4 (H) 03/28/2023 1051   RBC  3.75 (L) 03/12/2024 0848   HGB 9.4 (L) 03/12/2024 0848   HGB 13.1 08/12/2014 0955   HCT 29.5 (L) 03/12/2024 0848   HCT 40.4 08/12/2014 0955   PLT 411 (H) 03/12/2024 0848   PLT 228 08/12/2014 0955   MCV 78.7 (L) 03/12/2024 0848   MCV 98.2 08/12/2014 0955   MCH 25.1 (L) 03/12/2024 0848   MCHC 31.9 03/12/2024 0848   RDW 17.7 (H) 03/12/2024 0848   RDW 13.0 08/12/2014 0955   LYMPHSABS 2.0 03/12/2024 0848   LYMPHSABS 2.0 08/12/2014 0955   MONOABS 0.9 03/12/2024 0848   MONOABS 0.5 08/12/2014 0955   EOSABS 0.6 (H) 03/12/2024 0848   EOSABS 0.2 08/12/2014 0955   BASOSABS 0.1 03/12/2024 0848   BASOSABS 0.0 08/12/2014 0955    CMP     Component Value Date/Time   NA 136 03/12/2024 0848   NA 142 08/12/2014 0956   K 4.1 03/12/2024 0848   K 4.6 08/12/2014 0956   CL 102 03/12/2024 0848  CL 100 12/20/2012 1509   CO2 28 03/12/2024 0848   CO2 26 08/12/2014 0956   GLUCOSE 62 (L) 03/12/2024 0848   GLUCOSE 94 08/12/2014 0956   GLUCOSE 98 12/20/2012 1509   BUN 6 03/12/2024 0848   BUN 10.1 08/12/2014 0956   CREATININE 0.55 03/12/2024 0848   CREATININE 0.83 03/28/2023 1051   CREATININE 0.9 08/12/2014 0956   CALCIUM 9.5 03/12/2024 0848   CALCIUM 9.3 08/12/2014 0956   PROT 7.5 03/12/2024 0848   PROT 7.1 08/12/2014 0956   ALBUMIN 3.3 (L) 03/12/2024 0848   ALBUMIN 3.8 08/12/2014 0956   AST 9 (L) 03/12/2024 0848   AST 15 08/12/2014 0956   ALT 7 03/12/2024 0848   ALT 11 08/12/2014 0956   ALKPHOS 82 03/12/2024 0848   ALKPHOS 68 08/12/2014 0956   BILITOT 0.2 03/12/2024 0848   BILITOT 0.25 08/12/2014 0956   GFRNONAA >60 03/12/2024 0848   GFRNONAA 76 03/11/2019 1057   GFRAA >60 03/24/2020 1012   GFRAA >60 02/11/2020 0836   GFRAA 88 03/11/2019 1057     ASSESSMENT and THERAPY PLAN:   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  SRS brain: 08/25/2017-09/04/2017 2. Anti Her 2 therapy with Lapatinib  started 09/17/2017-05/14/2019: Stopped for progression 3.  I discontinued tamoxifen  and started her on letrozole  2.5 mg daily.  05/14/2019 stopped for progression 4.  Stereotactic radiosurgery 12/19/2017 to the new right parietal lobe metastases. 5.  Kadcyla : Received 16 cycles discontinued 05/05/2020 6.  Tucatinib  Xeloda : Discontinued because of hand-foot syndrome 06/11/2020-12/15/2020 7. Herceptin /Neratinib : unable to tolerate due to diarrhea and weight loss 8.  SRS to new brain mets 10/01/2021 9.  Resection of brain metastases at [facility] 12/29/2022: Metastatic breast cancer proc panel pending 10.  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 Recent imaging and echocardiogram satisfactory--repeat CT C/A/P and echo in 06/2024. No blood pressure or proteinuria issues. Slower healing due to Avastin --though no current issues noted. - Continue Herceptin  and Avastin  treatment. - Monitor blood pressure and urine protein regularly. - Monitor for signs of bleeding or delayed wound healing. - Continue to f/u with Dr. Buckley regarding brain metastases and managment  Unintentional weight loss and poor appetite Experiencing poor appetite and weight loss. Discussed appetite stimulant antidepressant considering interactions with her current medications. Notes she was not on Keppra  or Tussionex when she was taking Dexamethasone  the last time.   -  Prescribe Dexamethasone  2mg  daily. - If causes sleep pattern disturbance, will try Mirtazapine QHS.  Hypoglycemia Blood sugar low at 62. Discussed risks of blood sugar being decreased.  Hopeful that Dexamethasone  will increase blood sugar. - Dexamethasone  to be delivered by [facility] pharmacist to patient in infusion. - Monitor blood sugar levels closely.  Dehydration Darker yellow urine indicates insufficient hydration. Previous fluids improved condition. - Encourage increased oral fluid intake to achieve lighter urine color. - Will administer one liter of normal saline during treatment  with every treatment moving forward.  RTC in 3 weeks for labs, f/u, and her next treatment.     All questions were answered. The patient knows to call the clinic with any problems, questions or concerns. We can certainly see the patient much sooner if necessary.  Total encounter time:45 minutes*in face-to-face visit time, chart review, lab review, care coordination, order entry, and documentation of the encounter time.    Morna Kendall, NP 03/12/24 1:46 PM Medical Oncology and Hematology [facility] [address] [place], [place] [address] Tel.  [phone]    Fax. [phone]  *Total Encounter Time as defined by the Centers for Medicare and Medicaid Services includes, in addition to the face-to-face time of a patient visit (documented in the note above) non-face-to-face time: obtaining and reviewing outside history, ordering and reviewing medications, tests or procedures, care coordination (communications with other health care professionals or caregivers) and documentation in the medical record.

## 2024-03-14 ENCOUNTER — Other Ambulatory Visit: Payer: Self-pay

## 2024-03-15 ENCOUNTER — Other Ambulatory Visit: Payer: Self-pay

## 2024-03-20 ENCOUNTER — Other Ambulatory Visit: Payer: Self-pay

## 2024-03-26 ENCOUNTER — Other Ambulatory Visit: Payer: Self-pay | Admitting: Hematology and Oncology

## 2024-03-26 MED ORDER — HYDROCOD POLI-CHLORPHE POLI ER 10-8 MG/5ML PO SUER: 5.0000 mL | Freq: Two times a day (BID) | ORAL | 0 refills | Status: DC

## 2024-04-02 ENCOUNTER — Ambulatory Visit

## 2024-04-02 ENCOUNTER — Inpatient Hospital Stay (HOSPITAL_BASED_OUTPATIENT_CLINIC_OR_DEPARTMENT_OTHER): Admitting: Hematology and Oncology

## 2024-04-02 ENCOUNTER — Encounter: Payer: Self-pay | Admitting: Hematology and Oncology

## 2024-04-02 ENCOUNTER — Inpatient Hospital Stay: Admitting: Nutrition

## 2024-04-02 ENCOUNTER — Inpatient Hospital Stay

## 2024-04-02 VITALS — BP 105/52

## 2024-04-02 VITALS — BP 96/63 | HR 61 | Temp 97.8°F | Resp 18 | Ht 62.0 in | Wt 100.6 lb

## 2024-04-02 DIAGNOSIS — Z171 Estrogen receptor negative status [ER-]: Secondary | ICD-10-CM

## 2024-04-02 DIAGNOSIS — C50912 Malignant neoplasm of unspecified site of left female breast: Secondary | ICD-10-CM

## 2024-04-02 DIAGNOSIS — Z5112 Encounter for antineoplastic immunotherapy: Secondary | ICD-10-CM | POA: Diagnosis not present

## 2024-04-02 DIAGNOSIS — C50212 Malignant neoplasm of upper-inner quadrant of left female breast: Secondary | ICD-10-CM

## 2024-04-02 DIAGNOSIS — C7931 Secondary malignant neoplasm of brain: Secondary | ICD-10-CM | POA: Diagnosis not present

## 2024-04-02 LAB — CBC WITH DIFFERENTIAL (CANCER CENTER ONLY)
Abs Immature Granulocytes: 0.06 K/uL (ref 0.00–0.07)
Basophils Absolute: 0.1 K/uL (ref 0.0–0.1)
Basophils Relative: 1 %
Eosinophils Absolute: 0.4 K/uL (ref 0.0–0.5)
Eosinophils Relative: 2 %
HCT: 29.9 % — ABNORMAL LOW (ref 36.0–46.0)
Hemoglobin: 9.7 g/dL — ABNORMAL LOW (ref 12.0–15.0)
Immature Granulocytes: 0 %
Lymphocytes Relative: 19 %
Lymphs Abs: 2.9 K/uL (ref 0.7–4.0)
MCH: 25.2 pg — ABNORMAL LOW (ref 26.0–34.0)
MCHC: 32.4 g/dL (ref 30.0–36.0)
MCV: 77.7 fL — ABNORMAL LOW (ref 80.0–100.0)
Monocytes Absolute: 1.1 K/uL — ABNORMAL HIGH (ref 0.1–1.0)
Monocytes Relative: 7 %
Neutro Abs: 10.7 K/uL — ABNORMAL HIGH (ref 1.7–7.7)
Neutrophils Relative %: 71 %
Platelet Count: 392 K/uL (ref 150–400)
RBC: 3.85 MIL/uL — ABNORMAL LOW (ref 3.87–5.11)
RDW: 19 % — ABNORMAL HIGH (ref 11.5–15.5)
WBC Count: 15.3 K/uL — ABNORMAL HIGH (ref 4.0–10.5)
nRBC: 0 % (ref 0.0–0.2)

## 2024-04-02 LAB — CMP (CANCER CENTER ONLY)
ALT: 13 U/L (ref 0–44)
AST: 11 U/L — ABNORMAL LOW (ref 15–41)
Albumin: 3.4 g/dL — ABNORMAL LOW (ref 3.5–5.0)
Alkaline Phosphatase: 68 U/L (ref 38–126)
Anion gap: 4 — ABNORMAL LOW (ref 5–15)
BUN: 14 mg/dL (ref 6–20)
CO2: 29 mmol/L (ref 22–32)
Calcium: 9.3 mg/dL (ref 8.9–10.3)
Chloride: 104 mmol/L (ref 98–111)
Creatinine: 0.61 mg/dL (ref 0.44–1.00)
GFR, Estimated: >60 mL/min (ref >60)
Glucose, Bld: 72 mg/dL (ref 70–99)
Potassium: 3.7 mmol/L (ref 3.5–5.1)
Sodium: 137 mmol/L (ref 135–145)
Total Bilirubin: 0.2 mg/dL (ref 0.0–1.2)
Total Protein: 7 g/dL (ref 6.5–8.1)

## 2024-04-02 LAB — TOTAL PROTEIN, URINE DIPSTICK: Protein, ur: NEGATIVE mg/dL

## 2024-04-02 MED ORDER — TRASTUZUMAB-ANNS CHEMO 150 MG IV SOLR
6.0000 mg/kg | Freq: Once | INTRAVENOUS | Status: AC
  Administered 2024-04-02: 300 mg via INTRAVENOUS
  Filled 2024-04-02: qty 14.29

## 2024-04-02 MED ORDER — DIPHENHYDRAMINE HCL 25 MG PO CAPS
25.0000 mg | ORAL_CAPSULE | Freq: Once | ORAL | Status: AC
  Administered 2024-04-02: 25 mg via ORAL
  Filled 2024-04-02: qty 1

## 2024-04-02 MED ORDER — SODIUM CHLORIDE 0.9 % IV SOLN
7.5000 mg/kg | Freq: Once | INTRAVENOUS | Status: AC
  Administered 2024-04-02: 350 mg via INTRAVENOUS
  Filled 2024-04-02: qty 14

## 2024-04-02 MED ORDER — SODIUM CHLORIDE 0.9 % IV SOLN: Freq: Once | INTRAVENOUS | Status: AC

## 2024-04-02 MED ORDER — ACETAMINOPHEN 325 MG PO TABS
650.0000 mg | ORAL_TABLET | Freq: Once | ORAL | Status: AC
  Administered 2024-04-02: 650 mg via ORAL
  Filled 2024-04-02: qty 2

## 2024-04-02 NOTE — Progress Notes (Signed)
 Brief nutrition follow-up completed with patient during infusion for metastatic breast cancer.  Patient receives Herceptin  and Avastin .  Weight: 100 pounds 9.6 ounces September 23 100 pounds 11.2 ounces August 12 103 pounds July 22  Labs include glucose 62 on September 22  Reports she is doing well.  Denies nutrition impact symptoms.  Tolerates 1 carton of oral nutrition supplement daily.  Please she has not lost any weight recently.  Verbalizes no questions or concerns today.  Nutrition diagnosis: Unintended weight loss is stable  Intervention: Provided support and encouragement to continue strategies for increased calories and protein and small amounts throughout the day. Continue oral nutrition supplements, at least 1 carton daily. Continue to consume adequate fluids throughout the day.  Monitoring, evaluation, goals: Tolerate adequate calories and protein to minimize weight loss.  Will follow as needed.  Patient has RD contact information for questions or concerns.  **Disclaimer: This note was dictated with voice recognition software. Similar sounding words can inadvertently be transcribed and this note may contain transcription errors which may not have been corrected upon publication of note.**

## 2024-04-02 NOTE — Progress Notes (Signed)
 Patient Care Team: [name], MD as PCP - General (Internal Medicine) Van Knee, MD as Referring Physician (Emergency Medicine)  DIAGNOSIS:  Encounter Diagnosis  Name Primary?   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 Ki-67 was 70% HER-2 was amplified with a ratio 2.91   07/12/2012 - 07/17/2013 Neo-Adjuvant Chemotherapy   TCH 6 followed by Herceptin  maintenance   08/14/2012 Genetic Testing   Negative genetic testing on the [facility].  The report date is August 14, 2012.  The [facility] gene panel offered by [facility] includes sequencing and deletion/duplication testing of the following 25 genes: APC, ATM, , BARD1, BMPR1A, BRCA1, BRCA2, BRIP1, CHD1, CDK4, CDKN2A, CHEK2, EPCAM (large rearrangement only), MLH1, MSH2, MSH6, MUTYH, NBN, PALB2, PMS2, PTEN, RAD51C, RAD51D, SMAD4, STK11, and TP53.     12/11/2012 Surgery   Left breast lumpectomy: 1.8 cm tumor 1 positive sentinel node, axillary lymph node dissection 02/08/2013 showed 0/13 lymph nodes   03/25/2013 - 05/06/2013 Radiation Therapy   Adjuvant radiation therapy   06/05/2013 - 07/20/2017 Anti-estrogen oral therapy   Tamoxifen  20 mg daily   07/27/2017 Relapse/Recurrence   MRI Brain: 3.4 x 2.9 x 2.9 cm RIGHT frontal lobe mass with imaging characteristics of solitary metastasis. Extensive vasogenic edema resulting in 9 mm RIGHT to LEFT midline shift. Equivocal very early LEFT ventricle entrapment.    08/04/2017 Surgery   Rt frontal brain resection: Poorly differentiated tumor IHC suggests breast primary ER and PR  Positive   08/25/2017 - 09/04/2017 Radiation Therapy   Stereotactic radiation   09/18/2017 -  Anti-estrogen oral therapy   Lapatinib  with letrozole    12/18/2017 - 12/19/2017 Radiation Therapy   New right parietal lobe metastases status post [facility]   08/27/2018 - 08/27/2020 Radiation Therapy   SRS to new brain metastases   05/09/2019 Relapse/Recurrence   Interval increase in size of the enhancing nodular left internal mammary soft tissue 2.8 cm.  Redemonstrated enlarged supraclavicular, lower cervical and lower posterior cervical nodes unchanged.  Interval increase in the bony erosion of the posterior and lateral left third rib, increasing soft tissue lesion eroding the left sternal body 3.1 cm was 2.5 cm.  Bronchiectatic changes   06/19/2019 - 05/05/2020 Chemotherapy   ado-trastuzumab emtansine  (KADCYLA )     06/15/2020 -  Chemotherapy   Xeloda , Tucatinib , Herceptin     04/12/2022 - 09/06/2022 Chemotherapy   Patient is on Treatment Plan : BREAST Trastuzumab  IV (8/6) or SQ (600) D1 q21d     09/27/2022 -  Chemotherapy   Patient is on Treatment Plan : BREAST MAINTENANCE Trastuzumab  IV every 3 weeks (21 days)     11/08/2022 Miscellaneous   Adding low-dose neratinib  (80 mg) to Herceptin    12/20/2022 Cancer Staging   Staging form: Breast, AJCC 7th Edition - Pathologic: Stage IV (M1) - Signed by Crawford Morna Pickle, NP on 12/20/2022   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 /Initial Ca 1997, Brain Mets 2019  06/10/2019 Initial Diagnosis   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 She is accompanied by her husband.  She experiences prolonged healing times for minor injuries, such as a scratch from her dog taking almost a month to heal. Her blood pressure remains consistently low, a condition present since her twenties, without medication. Recent lab results show a hemoglobin level of 9.7, an improvement from previous levels, a white blood cell count of 15, and a platelet count of 392. There is no protein in her urine. She has increased coughing in the mornings. She continues regular imaging, with a CT scan on January 05, 2024, and an MRI scheduled for next month.     ALLERGIES:  is allergic to aspirin, protonix  [pantoprazole ], doxycycline , promethazine -codeine , sulfamethoxazole-trimethoprim , and iodinated contrast media.  MEDICATIONS:  Current Outpatient Medications  Medication Sig Dispense Refill   acetaminophen  (TYLENOL ) 325 MG tablet Take 2 tablets (650 mg total) by mouth every 6 (six) hours as needed for mild pain (or Fever >/= 101). 12 tablet 0   albuterol  (VENTOLIN  HFA) 108 (90 Base) MCG/ACT inhaler Inhale 2 puffs into the lungs every 6 (six) hours as needed for wheezing or shortness of breath (cough). 18 g 5   calcium carbonate (TUMS EX) 750 MG chewable tablet Chew by mouth.     chlorpheniramine -HYDROcodone  (TUSSIONEX) 10-8 MG/5ML Take 5 mLs by mouth 2 (two) times daily. 300 mL 0   dexamethasone  (DECADRON ) 2 MG tablet Take 1 tablet (2 mg total) by mouth daily. 90 tablet 0   escitalopram (LEXAPRO) 10 MG tablet Take 10 mg by mouth daily.     levalbuterol  (XOPENEX ) 0.63 MG/3ML nebulizer solution Take 3 mLs (0.63 mg total) by nebulization every 4 (four) hours as needed for wheezing or shortness of breath. 90 mL 12   levETIRAcetam  (KEPPRA ) 250 MG tablet Take 1 tablet (250 mg total) by mouth 2 (two) times daily. 60 tablet 2   lidocaine -prilocaine  (EMLA )  cream APPLY EXTERNALLY TO THE AFFECTED AREA 1 TIME 30 g 3   Multiple Vitamins-Minerals (EMERGEN-C VITAMIN C PO) Take by mouth.     naproxen sodium (ALEVE) 220 MG tablet Take by mouth.     ondansetron  (ZOFRAN -ODT) 4 MG disintegrating tablet Take 4 mg by mouth every 8 (eight) hours as needed.     SYMBICORT  160-4.5 MCG/ACT inhaler Inhale 2 puffs into the lungs 2 (two) times daily.     predniSONE  (DELTASONE ) 50 MG tablet Take 1 tablet 13 hrs prior to scan, 2nd tablet 7 hrs prior, 3rd tablet 1 hr prior (Patient not taking: Reported on 04/02/2024) 3 tablet 1   No current facility-administered medications for this visit.   Facility-Administered Medications Ordered in Other Visits  Medication Dose Route Frequency Provider Last Rate Last Admin   alteplase  (CATHFLO ACTIVASE ) injection 2 mg  2 mg Intracatheter Once PRN Odean Potts, MD       sodium chloride  flush (NS) 0.9 % injection 10 mL  10 mL Intracatheter PRN Odean Potts, MD   10 mL at 07/06/21 1428    PHYSICAL EXAMINATION: ECOG PERFORMANCE STATUS: 1 - Symptomatic but completely ambulatory  Vitals:   04/02/24 9145  BP: 96/63  Pulse: 61  Resp: 18  Temp: 97.8 F (36.6 C)  SpO2: 100%   Filed Weights   04/02/24 0854  Weight: 100 lb 9.6 oz (45.6 kg)     LABORATORY DATA:  I have reviewed the data as listed    Latest Ref Rng & Units 03/12/2024    8:48 AM 01/30/2024    1:11 PM 01/09/2024   10:04 AM  CMP  Glucose 70 - 99 mg/dL 62  81  72   BUN 6 - 20 mg/dL 6  7  9    Creatinine 0.44 - 1.00 mg/dL 9.44  9.35  9.31   Sodium 135 - 145 mmol/L 136  136  138   Potassium 3.5 - 5.1 mmol/L 4.1  4.3  4.2   Chloride 98 - 111 mmol/L 102  101  105   CO2 22 - 32 mmol/L 28  30  28    Calcium 8.9 - 10.3 mg/dL 9.5  9.3  9.4   Total Protein 6.5 - 8.1 g/dL 7.5  7.4  7.2   Total Bilirubin 0.0 - 1.2 mg/dL 0.2  0.2  0.2   Alkaline Phos 38 - 126 U/L 82  93  89   AST 15 - 41 U/L 9  10  9    ALT 0 - 44 U/L 7  7  6      Lab Results  Component Value Date    WBC 15.3 (H) 04/02/2024   HGB 9.7 (L) 04/02/2024   HCT 29.9 (L) 04/02/2024   MCV 77.7 (L) 04/02/2024   PLT 392 04/02/2024   NEUTROABS 10.7 (H) 04/02/2024    ASSESSMENT & PLAN:  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  SRS brain: 08/25/2017-09/04/2017 2. Anti Her 2 therapy with Lapatinib  started 09/17/2017-05/14/2019: Stopped for progression 3.  I discontinued tamoxifen  and started her on letrozole  2.5 mg daily.  05/14/2019 stopped for progression 4.  Stereotactic radiosurgery 12/19/2017 to the new right parietal lobe metastases. 5.  Kadcyla : Received 16 cycles discontinued 05/05/2020 6.  Tucatinib  Xeloda : Discontinued because of hand-foot syndrome 06/11/2020-12/15/2020 7. Herceptin /Neratinib : unable to tolerate due to diarrhea and weight loss 8.  SRS to new brain mets 10/01/2021 9.  Resection of brain metastases at [facility] 12/29/2022: Metastatic breast cancer proc panel pending 10.  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  Follows with pulmonary   Brain metastases: External beam radiation given.  Dr. Buckley and  CNS tumor board recommended concurrent treatment with Avastin  7.5 mg/kg IV every 3 weeks (for management of recurrent brain metastases and radiation necrosis) Return to clinic every 3 weeks for Herceptin  and every 6 weeks for follow-up with us  Ordered CT chest abdomen pelvis to be done in 3 months (December 2025) ------------------------------------- Assessment and Plan Assessment & Plan Metastatic breast cancer with brain involvement Undergoing Herceptin  and Avastin  treatment. No bleeding issues, delayed wound healing noted. MRI scheduled to assess metastasis status. - Continue Herceptin  and Avastin  therapy. - Schedule MRI for next month.  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  Delayed wound healing Delayed healing likely due to cancer treatment and anemia.  Chronic hypotension Chronic low blood pressure not attributed to medication, possibly related to anemia and overall health. - Ensure adequate hydration.      No orders of the defined types were placed in this encounter.  The patient has a good understanding of the overall plan. she agrees with it. she will call with any problems that may develop before the next visit here. Total time spent: 30 mins including face to face time and time spent for planning, charting and co-ordination of care   Naomi MARLA Chad, MD 04/02/24

## 2024-04-02 NOTE — Assessment & Plan Note (Signed)
 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  SRS brain: 08/25/2017-09/04/2017 2. Anti Her 2 therapy with Lapatinib  started 09/17/2017-05/14/2019: Stopped for progression 3.  I discontinued tamoxifen  and started her on letrozole  2.5 mg daily.  05/14/2019 stopped for progression 4.  Stereotactic radiosurgery 12/19/2017 to the new right parietal lobe metastases. 5.  Kadcyla : Received 16 cycles discontinued 05/05/2020 6.  Tucatinib  Xeloda : Discontinued because of hand-foot syndrome 06/11/2020-12/15/2020 7. Herceptin /Neratinib : unable to tolerate due to diarrhea and weight loss 8.  SRS to new brain mets 10/01/2021 9.  Resection of brain metastases at [facility] 12/29/2022: Metastatic breast cancer proc panel pending 10.  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  Follows with pulmonary   Brain metastases: External beam radiation given.  Dr. Buckley and [facility] tumor board recommended concurrent treatment with Avastin  7.5 mg/kg IV every 3 weeks (for management of recurrent brain metastases and radiation necrosis) Return to clinic every 3 weeks for Herceptin  and every 6 weeks for follow-up with us 

## 2024-04-02 NOTE — Patient Instructions (Signed)
 [facility] - A DEPT OF [facility]  Discharge Instructions: Thank you for choosing [facility] to provide your oncology and hematology care.   If you have a lab appointment with the Cancer Center, please go directly to the Cancer Center and check in at the registration area.   Wear comfortable clothing and clothing appropriate for easy access to any Portacath or PICC line.   We strive to give you quality time with your provider. You may need to reschedule your appointment if you arrive late (15 or more minutes).  Arriving late affects you and other patients whose appointments are after yours.  Also, if you miss three or more appointments without notifying the office, you may be dismissed from the clinic at the provider's discretion.      For prescription refill requests, have your pharmacy contact our office and allow 72 hours for refills to be completed.    Today you received the following chemotherapy and/or immunotherapy agents: MVASI /Kanjinti       To help prevent nausea and vomiting after your treatment, we encourage you to take your nausea medication as directed.  BELOW ARE SYMPTOMS THAT SHOULD BE REPORTED IMMEDIATELY: *FEVER GREATER THAN 100.4 F (38 C) OR HIGHER *CHILLS OR SWEATING *NAUSEA AND VOMITING THAT IS NOT CONTROLLED WITH YOUR NAUSEA MEDICATION *UNUSUAL SHORTNESS OF BREATH *UNUSUAL BRUISING OR BLEEDING *URINARY PROBLEMS (pain or burning when urinating, or frequent urination) *BOWEL PROBLEMS (unusual diarrhea, constipation, pain near the anus) TENDERNESS IN MOUTH AND THROAT WITH OR WITHOUT PRESENCE OF ULCERS (sore throat, sores in mouth, or a toothache) UNUSUAL RASH, SWELLING OR PAIN  UNUSUAL VAGINAL DISCHARGE OR ITCHING   Items with * indicate a potential emergency and should be followed up as soon as possible or go to the Emergency Department if any problems should occur.  Please show the CHEMOTHERAPY ALERT CARD or  IMMUNOTHERAPY ALERT CARD at check-in to the Emergency Department and triage nurse.  Should you have questions after your visit or need to cancel or reschedule your appointment, please contact [facility] - A DEPT OF [facility]  Dept: [phone]  and follow the prompts.  Office hours are 8:00 a.m. to 4:30 p.m. Monday - Friday. Please note that voicemails left after 4:00 p.m. may not be returned until the following business day.  We are closed weekends and major holidays. You have access to a nurse at all times for urgent questions. Please call the main number to the clinic Dept: [phone] and follow the prompts.   For any non-urgent questions, you may also contact your provider using MyChart. We now offer e-Visits for anyone 41 and older to request care online for non-urgent symptoms. For details visit [email or web address].   Also download the MyChart app! Go to the app store, search MyChart, open the app, select [place], and log in with your MyChart username and password.

## 2024-04-09 DIAGNOSIS — E7800 Pure hypercholesterolemia, unspecified: Secondary | ICD-10-CM | POA: Diagnosis not present

## 2024-04-09 DIAGNOSIS — C50919 Malignant neoplasm of unspecified site of unspecified female breast: Secondary | ICD-10-CM | POA: Diagnosis not present

## 2024-04-16 ENCOUNTER — Ambulatory Visit
Admission: RE | Admit: 2024-04-16 | Discharge: 2024-04-16 | Disposition: A | Discharge status: Home / Self Care | Source: Ambulatory Visit | Attending: Radiation Oncology | Admitting: Radiation Oncology

## 2024-04-16 DIAGNOSIS — C7931 Secondary malignant neoplasm of brain: Secondary | ICD-10-CM

## 2024-04-16 DIAGNOSIS — I639 Cerebral infarction, unspecified: Secondary | ICD-10-CM | POA: Diagnosis not present

## 2024-04-16 MED ORDER — GADOPICLENOL 0.5 MMOL/ML IV SOLN
5.0000 mL | Freq: Once | INTRAVENOUS | Status: AC | PRN
  Administered 2024-04-16: 5 mL via INTRAVENOUS

## 2024-04-17 ENCOUNTER — Ambulatory Visit: Payer: Self-pay | Admitting: Radiation Oncology

## 2024-04-18 ENCOUNTER — Encounter: Payer: Self-pay | Admitting: Pulmonary Disease

## 2024-04-18 ENCOUNTER — Ambulatory Visit (INDEPENDENT_AMBULATORY_CARE_PROVIDER_SITE_OTHER): Admitting: Pulmonary Disease

## 2024-04-18 VITALS — BP 100/65 | HR 79 | Temp 97.8°F | Ht 62.0 in | Wt 106.6 lb

## 2024-04-18 DIAGNOSIS — J984 Other disorders of lung: Secondary | ICD-10-CM | POA: Diagnosis not present

## 2024-04-18 NOTE — Patient Instructions (Addendum)
 It is good to see you again  Your most recent CT scan looks stable, this is good news  As long as the cough is manageable and not worsening I think it is okay to continue using the cough syrup as needed, particularly at night so you can rest  If symptoms worsen please let me know and we can reevaluate sooner.  Return to clinic in 6 months or sooner as needed with Dr. Annella

## 2024-04-18 NOTE — Progress Notes (Signed)
 @Patient  ID: Melissa [name], female    DOB: 06-09-1966, 58 y.o.   MRN: [id number]  No chief complaint on file.   Referring provider: [name], MD  HPI:   58 y.o. woman with history of breast cancer with intracranial metastasis status post recent radiation and on a Avastin  therapy as well as background tamoxifen  with history of fungal infections status post left upper lobe lobectomy, concern for more recent aspergillosis on and off azole therapy currently off for some time with stable imaging findings, chronic left-sided bronchiectasis.  Most recent ID note reviewed.  Most recent hematology oncology note reviewed.  Most recent radiation oncology note reviewed.  Overall stable.  Cough is stable.  Chronic cough most bothersome at night.  During the day it is manageable.  Using cough suppressant, cough syrup, to good effect at night mostly.  Can take twice a day but she mainly takes it at night.  Rarely more than that.  Not needed to use her inhalers.  No real dyspnea or significant shortness of breath.  Reviewed her CT scan 12/2023 that reveals stable bronchiectatic changes and thickened cavities of the left lung without signs of worsening superinfection or significant changes on my interpretation.  HPI at initial visit: Patient last in clinic in 2022.  Lost to follow-up.  Has had serial CT scans given her history of breast cancer.  For several years has demonstrated significant bronchiectatic changes, cavitary lesion that looks like a very large dilated bronchus on my review interpretation, relatively severe bronchiectasis on the left, and scattered emphysematous changes with waxing and waning air-fluid levels in the cavities.  Recently it was discovered she had intracranial metastasis.  She is undergone radiation therapy.  She started on a Avastin  about 9 weeks ago, has received 3 infusions.  She states cough worsened shortly after initiation of a Avastin .  Over the last few weeks she has had  hemoptysis.  In the past she has had hemoptysis come and go.  But is pretty persistent over the last several weeks.  This prompted visit to the ED in [place] via [facility] system 04/09/2023 CT scan at that date is interpreted as infiltrate near prior cavitary lesion concerning for progression of aspergillosis.  I cannot review these images.  She endorses weight loss.  Some chills.  Recent voriconazole  level seems low but I am not quite sure how best to interpret it.    Questionaires / Pulmonary Flowsheets:   ACT:      No data to display          MMRC: mMRC Dyspnea Scale mMRC Score  03/05/2020  3:06 PM 1    Epworth:      No data to display          Tests:   FENO:  No results found for: NITRICOXIDE  PFT:    Latest Ref Rng & Units 08/09/2016    9:43 AM  PFT Results  FVC-Pre L 2.79   FVC-Predicted Pre % 83   FVC-Post L 2.92   FVC-Predicted Post % 87   Pre FEV1/FVC % % 69   Post FEV1/FCV % % 70   FEV1-Pre L 1.94   FEV1-Predicted Pre % 73   FEV1-Post L 2.05   DLCO uncorrected ml/min/mmHg 10.08   DLCO UNC% % 46   DLVA Predicted % 55   TLC L 4.75   TLC % Predicted % 100   RV % Predicted % 124   Personally reviewed interpreted as mild COPD,  no significant bronchodilator response, lung volumes consistent with air trapping, DLCO severely reduced  WALK:     05/02/2018    3:51 PM  SIX MIN WALK  Supplimental Oxygen during Test? (L/min) No  Tech Comments: Fast paced with some SOB noted.    Imaging: Personally reviewed and as per EMR discussion this note MR Brain W Wo Contrast Result Date: 04/16/2024 EXAM: MRI BRAIN WITHOUT AND WITH CONTRAST 04/16/2024 01:11:50 PM TECHNIQUE: Multiplanar multisequence MRI of the head/brain was performed without and with the administration of intravenous contrast. COMPARISON: MRI head 12/14/2023 CLINICAL HISTORY: Brain metastases, assess treatment response; 3T SRS Protocol. Multiple courses of SRS and recent whole brain course. SRS RE STAGING  PROTOCOL; Mri brain w wo; 5 ml vueway ; 7.5 ml vial; 2.5 ml wasted; Hx brain mets/ assess treatment/ s/p radiation. FINDINGS: BRAIN AND VENTRICLES: Minimal decrease in multiple enhancing lesions including: Punctate lesion in the posterior left temporal lobe (series 14, image 59). More ill defined 8 mm lesion in the pons (series 14, 48) . Slightly decreased bulk of enhancement along the anterior right temporal lobe (series 14, 43). Decreased bulk of extra-axial enhancement along the anterior right falx at the vertex (series 14, image 13). Mildly decreased size of 6 mm cerebellar lesion (series 14, image 62). The edema surrounding these lesions is either unchanged or mildly improved. No new areas of enhancement. No acute infarct. No acute intracranial hemorrhage. No mass effect or midline shift. No hydrocephalus. Normal flow voids. ORBITS: No acute abnormality. SINUSES: No acute abnormality. BONES AND SOFT TISSUES: NoNo focal worrisome lesion. Right frontal and left parietooccipital cranioplasties. IMPRESSION: 1. Interval decrease in size in multiple enhancing lesions as detailed above. 2. No new lesions identified. Electronically signed by: Gilmore Molt MD 04/16/2024 11:15 PM EDT RP Workstation: [id number]    Lab Results: Personally reviewed CBC    Component Value Date/Time   WBC 15.3 (H) 04/02/2024 0832   WBC 11.4 (H) 03/28/2023 1051   RBC 3.85 (L) 04/02/2024 0832   HGB 9.7 (L) 04/02/2024 0832   HGB 13.1 08/12/2014 0955   HCT 29.9 (L) 04/02/2024 0832   HCT 40.4 08/12/2014 0955   PLT 392 04/02/2024 0832   PLT 228 08/12/2014 0955   MCV 77.7 (L) 04/02/2024 0832   MCV 98.2 08/12/2014 0955   MCH 25.2 (L) 04/02/2024 0832   MCHC 32.4 04/02/2024 0832   RDW 19.0 (H) 04/02/2024 0832   RDW 13.0 08/12/2014 0955   LYMPHSABS 2.9 04/02/2024 0832   LYMPHSABS 2.0 08/12/2014 0955   MONOABS 1.1 (H) 04/02/2024 0832   MONOABS 0.5 08/12/2014 0955   EOSABS 0.4 04/02/2024 0832   EOSABS 0.2 08/12/2014 0955    BASOSABS 0.1 04/02/2024 0832   BASOSABS 0.0 08/12/2014 0955    BMET    Component Value Date/Time   NA 137 04/02/2024 0834   NA 142 08/12/2014 0956   K 3.7 04/02/2024 0834   K 4.6 08/12/2014 0956   CL 104 04/02/2024 0834   CL 100 12/20/2012 1509   CO2 29 04/02/2024 0834   CO2 26 08/12/2014 0956   GLUCOSE 72 04/02/2024 0834   GLUCOSE 94 08/12/2014 0956   GLUCOSE 98 12/20/2012 1509   BUN 14 04/02/2024 0834   BUN 10.1 08/12/2014 0956   CREATININE 0.61 04/02/2024 0834   CREATININE 0.83 03/28/2023 1051   CREATININE 0.9 08/12/2014 0956   CALCIUM 9.3 04/02/2024 0834   CALCIUM 9.3 08/12/2014 0956   GFRNONAA >60 04/02/2024 0834   GFRNONAA 76 03/11/2019 1057  GFRAA >60 03/24/2020 1012   GFRAA >60 02/11/2020 0836   GFRAA 88 03/11/2019 1057    BNP No results found for: BNP  ProBNP No results found for: PROBNP  Specialty Problems       Pulmonary Problems   Obstructive bronchiectasis (HCC)   Status post left upper lobectomy for ? [place]  1997 CT chest   01/26/2011 Focal bronchiectasis / scarring medially in the left lung apex with  surrounding inflammatory changes  - Pneumovax 08/12/11  And prevnar 08/09/2016  - PFT's  08/09/2016  FEV1 2.05 (77 % ) ratio 70  p 5 % improvement from saba p dulera  100 x2  prior to study with DLCO  46 % corrects to 55  % for alv volume   - alpha one screening 03/27/2018  MM 267  - FOB/BAL 05/23/18 Pos Aspergillus > ID following -  12/13/2018  After extensive coaching inhaler device,  effectiveness =    75% (short ti)        Upper airway cough syndrome   Max rx for gerd trial basis 08/09/2016 >>>         Emphysema lung (HCC)   Cavitary lesion of lung in area of previous cyts in apex of Sup Segment of LLL   New since CT 01/18/18  assoc with acute onset L axillary cp 03/28/18 in area of previous blebs s a/f level c/w infected bleb rx augmentin  x 10 days > improved at ov 04/11/2018 and 10 more days augmentin  given - Quant TB 03/27/2018 neg  - ID  eval 04/09/18 fungal serologies sent > neg  - 04/11/2018 rec another 10 days augmentin  for ? Abscess forming LUL  - Following with ID ? Start fluconazole  if not improving. Req records be sent to MD who treated [place] in '97   - CT 04/26/18 > Overall, there has been interval worsening in lung disease on the Left  - FOB Dr Karlis 05/24/19 with BAL pos for aspergillus > see ID rx          DOE (dyspnea on exertion)   05/02/2018  Walked RA x 3 laps @ 185 ft each stopped due to  End of study, fast pace, no   desat - min sob         Chronic pulmonary aspergillosis (HCC)   Hemoptysis    Allergies  Allergen Reactions   Aspirin Anaphylaxis and Shortness Of Breath    THROAT CLOSES   Protonix  [Pantoprazole ] Nausea Only and Other (See Comments)    Also caused a film in the mouth and caused chest pressure   Doxycycline  Other (See Comments)    Hallucinations, headaches   Promethazine -Codeine  Cough    Worsening cough and insomnia    Sulfamethoxazole-Trimethoprim  Other (See Comments)   Iodinated Contrast Media Rash    Patient allergic to contrast used in radiation oncology for CT simulation     Immunization History  Administered Date(s) Administered   Influenza Split 05/03/2011   Influenza Whole 03/11/2012   Influenza, Seasonal, Injecte, Preservative Fre 05/08/2023   Influenza,inj,Quad PF,6+ Mos 07/29/2017, 04/08/2019, 04/20/2020, 05/24/2021   Influenza-Unspecified 04/10/2022   PNEUMOCOCCAL CONJUGATE-20 08/18/2021   Pneumococcal Conjugate-13 08/09/2016   Pneumococcal-Unspecified 08/12/2011    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   Asthma    Breast cancer (HCC)    Bronchiectasis (HCC)    Bronchiolitis    Cancer (HCC)  breast cancer 2014   Cancer of left breast metastatic to brain [facility])    2019   Cancer, metastatic to liver (HCC)    2021   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[facility])     Coccidioidomycosis 11/05/2023   Complication of anesthesia    bp dropped + desat    COPD (chronic obstructive pulmonary disease) (HCC)    Dyspnea    DOE   GERD (gastroesophageal reflux disease)    H/O coccidioidomycosis    was reason for lung lobectomy   Headache(784.0)    due to eye strain or not eating   Hemoptysis 05/08/2023   History of anemia    no current problem   History of asthma    as a child   History of breast cancer 2014   left   History of chemotherapy    finished 07/17/2013   History of hiatal hernia    AGE 54   Hx of radiation therapy 03/25/13-05/06/13   left breast 5000 cGy/25 sessions, left breast boost 1000 cGy/5 sessions   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   Personal history of chemotherapy    for liver cancer   Personal history of radiation therapy    Pneumonia    LAST FLARE UP 01/2018   Rash 02/22/2021   Runny nose 07/30/2013   clear drainage   Smoker 05/24/2021   Wears dentures    upper    Tobacco History: Social History   Tobacco Use  Smoking Status Every Day   Current packs/day: 0.30   Average packs/day: 0.3 packs/day for 38.0 years (11.4 ttl pk-yrs)   Types: Cigarettes  Smokeless Tobacco Never  Tobacco Comments   1 pack a day 04/18/24 MMP   Ready to quit: Not Answered Counseling given: Not Answered Tobacco comments: 1 pack a day 04/18/24 MMP   Continue to not smoke  Outpatient Encounter Medications as of 04/18/2024  Medication Sig   acetaminophen  (TYLENOL ) 325 MG tablet Take 2 tablets (650 mg total) by mouth every 6 (six) hours as needed for mild pain (or Fever >/= 101).   albuterol  (VENTOLIN  HFA) 108 (90 Base) MCG/ACT inhaler Inhale 2 puffs into the lungs every 6 (six) hours as needed for wheezing or shortness of breath (cough).   calcium carbonate (TUMS EX) 750 MG chewable tablet Chew by mouth.   chlorpheniramine -HYDROcodone  (TUSSIONEX) 10-8 MG/5ML Take 5 mLs by mouth 2 (two) times daily.   dexamethasone  (DECADRON ) 2 MG tablet Take 1 tablet (2 mg  total) by mouth daily.   escitalopram (LEXAPRO) 10 MG tablet Take 10 mg by mouth daily.   levalbuterol  (XOPENEX ) 0.63 MG/3ML nebulizer solution Take 3 mLs (0.63 mg total) by nebulization every 4 (four) hours as needed for wheezing or shortness of breath.   levETIRAcetam  (KEPPRA ) 250 MG tablet Take 1 tablet (250 mg total) by mouth 2 (two) times daily.   lidocaine -prilocaine  (EMLA ) cream APPLY EXTERNALLY TO THE AFFECTED AREA 1 TIME   Multiple Vitamins-Minerals (EMERGEN-C VITAMIN C PO) Take by mouth.   naproxen sodium (ALEVE) 220 MG tablet Take by mouth.   ondansetron  (ZOFRAN -ODT) 4 MG disintegrating tablet Take 4 mg by mouth every 8 (eight) hours as needed.   SYMBICORT  160-4.5 MCG/ACT inhaler Inhale 2 puffs into the lungs 2 (two) times daily.   predniSONE  (DELTASONE ) 50 MG tablet Take 1 tablet 13 hrs prior to scan, 2nd tablet 7 hrs prior, 3rd tablet 1 hr prior (Patient not taking: Reported on 04/18/2024)   Facility-Administered Encounter Medications as of 04/18/2024  Medication  alteplase  (CATHFLO ACTIVASE ) injection 2 mg   sodium chloride  flush (NS) 0.9 % injection 10 mL     Review of Systems  Review of Systems  N/a Physical Exam  BP 100/65   Pulse 79   Temp 97.8 F (36.6 C) (Oral)   Ht 5' 2 (1.575 m)   Wt 106 lb 9.6 oz (48.4 kg)   LMP 07/25/2012 Comment: pregnancy waiver form signed 01-18-2018  SpO2 96%   BMI 19.50 kg/m   Wt Readings from Last 5 Encounters:  04/18/24 106 lb 9.6 oz (48.4 kg)  04/02/24 100 lb 9.6 oz (45.6 kg)  03/12/24 98 lb 12.8 oz (44.8 kg)  02/20/24 100 lb 11.2 oz (45.7 kg)  01/30/24 103 lb (46.7 kg)    BMI Readings from Last 5 Encounters:  04/18/24 19.50 kg/m  04/02/24 18.40 kg/m  03/12/24 18.07 kg/m  02/20/24 18.42 kg/m  01/30/24 18.84 kg/m     Physical Exam General: Sitting in chair, no acute distress Eyes: EOMI, no icterus Pulmonary: Normal work of breathing, on room air, speaks in full sentences without distress Neuro: Moves all limbs  freely, no focal deficit Psych: Normal mood, full affect   Assessment & Plan:   Hemoptysis: Worse over the last few weeks.  With onset of worsening cough shortly after initiation of Avastin .  Cough is a relatively common side effect in the package insert.  It is likely worsening cough that prompted evaluation related to Avastin  and hemoptysis related to architectural disruption due to bronchiectasis with further exacerbation due to the effects of a Avastin .  CT scan prior to initial evaluation indicates worsening infiltrate near prior cavitary lesion, suspect this is just aspirated blood in that area as opposed to worsening infection.  Avastin  was discontinued and this resolved.  Subsequent CT scans showed stability and improvement, continue serial surveillance images per oncologist.  Possible aspergillosis: Historically was on chronic voriconazole , good therapeutic levels. Switched to posaconazole fall 2024.  Stopped in early 2025/late 2024 due to worsening cough.  Symptoms of cough stable off antifungal therapy.  Most recent CT scan 12/2023 without worsening signs of infection etc.  If concern for recurrence in the future based on symptoms or on imaging, would pursue bronchoscopy with BAL to see if we can prove Aspergillus is present.  I think biopsy of this area is high risk for damaging lung given significant structural alterations due to chronic infections.  Bronchiectasis: Suspect related to chronic infections.  Prior lobectomy due to fungal infection.  Encouraged ongoing airway clearance hypertonic saline and flutter valve.  Chronic cough: Multifactorial with consideration discussed above.  Overall cough improved after stopping posaconazole.  Still with nocturnal cough well treated with as needed narcotic cough syrup at night.  Encouraged to continue this.   Return in about 6 months (around 10/17/2024) for f/u Dr. Annella.   Melissa JONELLE Annella, MD 04/18/2024

## 2024-04-22 ENCOUNTER — Other Ambulatory Visit: Payer: Self-pay | Admitting: Hematology and Oncology

## 2024-04-22 ENCOUNTER — Inpatient Hospital Stay: Attending: Hematology and Oncology | Admitting: Internal Medicine

## 2024-04-22 ENCOUNTER — Inpatient Hospital Stay

## 2024-04-22 VITALS — BP 121/73 | HR 71 | Temp 97.8°F | Resp 16 | Ht 62.0 in | Wt 108.4 lb

## 2024-04-22 DIAGNOSIS — C50212 Malignant neoplasm of upper-inner quadrant of left female breast: Secondary | ICD-10-CM | POA: Insufficient documentation

## 2024-04-22 DIAGNOSIS — C787 Secondary malignant neoplasm of liver and intrahepatic bile duct: Secondary | ICD-10-CM | POA: Insufficient documentation

## 2024-04-22 DIAGNOSIS — Z170 Estrogen receptor positive status [ER+]: Secondary | ICD-10-CM | POA: Diagnosis not present

## 2024-04-22 DIAGNOSIS — C7931 Secondary malignant neoplasm of brain: Secondary | ICD-10-CM | POA: Insufficient documentation

## 2024-04-22 DIAGNOSIS — Z79899 Other long term (current) drug therapy: Secondary | ICD-10-CM | POA: Insufficient documentation

## 2024-04-22 DIAGNOSIS — Z1731 Human epidermal growth factor receptor 2 positive status: Secondary | ICD-10-CM | POA: Insufficient documentation

## 2024-04-22 DIAGNOSIS — Z1732 Human epidermal growth factor receptor 2 negative status: Secondary | ICD-10-CM

## 2024-04-22 DIAGNOSIS — Z79891 Long term (current) use of opiate analgesic: Secondary | ICD-10-CM | POA: Insufficient documentation

## 2024-04-22 DIAGNOSIS — Z5112 Encounter for antineoplastic immunotherapy: Secondary | ICD-10-CM | POA: Diagnosis not present

## 2024-04-22 DIAGNOSIS — C7951 Secondary malignant neoplasm of bone: Secondary | ICD-10-CM | POA: Diagnosis not present

## 2024-04-22 DIAGNOSIS — Z1721 Progesterone receptor positive status: Secondary | ICD-10-CM | POA: Insufficient documentation

## 2024-04-22 NOTE — Progress Notes (Signed)
 Dr. Buckley recommended discontinuation of Avastin .

## 2024-04-22 NOTE — Progress Notes (Signed)
 [facility] [address]  [place], [place] [address] [phone]   Interval Evaluation  Date of Service: 04/22/24 Patient Name: Melissa [name] Patient MRN: [id number] Patient DOB: [birth year] Provider: Arthea MARLA Manns, MD  Identifying Statement:  Melissa [name] is a 58 y.o. 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 Ki-67 was 70% HER-2 was amplified with a ratio 2.91   07/12/2012 - 07/17/2013 Neo-Adjuvant Chemotherapy   TCH 6 followed by Herceptin  maintenance   08/14/2012 Genetic Testing   Negative genetic testing on the [facility].  The report date is August 14, 2012.  The [facility] gene panel offered by [facility] includes sequencing and deletion/duplication testing of the following 25 genes: APC, ATM, , BARD1, BMPR1A, BRCA1, BRCA2, BRIP1, CHD1, CDK4, CDKN2A, CHEK2, EPCAM (large rearrangement only), MLH1, MSH2, MSH6, MUTYH, NBN, PALB2, PMS2, PTEN, RAD51C, RAD51D, SMAD4, STK11, and TP53.     12/11/2012 Surgery   Left breast lumpectomy: 1.8 cm tumor 1 positive sentinel node, axillary lymph node dissection 02/08/2013 showed 0/13 lymph nodes   03/25/2013 - 05/06/2013 Radiation Therapy   Adjuvant radiation therapy   06/05/2013 - 07/20/2017 Anti-estrogen oral therapy   Tamoxifen  20 mg daily   07/27/2017 Relapse/Recurrence   MRI Brain: 3.4 x 2.9 x 2.9 cm RIGHT frontal lobe mass with imaging characteristics of solitary metastasis. Extensive vasogenic edema resulting in 9 mm RIGHT to LEFT midline shift. Equivocal very early LEFT ventricle entrapment.    08/04/2017 Surgery   Rt frontal brain resection:  Poorly differentiated tumor IHC suggests breast primary ER and PR Positive   08/25/2017 - 09/04/2017 Radiation Therapy   Stereotactic radiation   09/18/2017 -  Anti-estrogen oral therapy   Lapatinib  with letrozole    12/18/2017 - 12/19/2017 Radiation Therapy   New right parietal lobe metastases status post [facility]   08/27/2018 - 08/27/2020 Radiation Therapy   [name] to new brain metastases   05/09/2019 Relapse/Recurrence   Interval increase in size of the enhancing nodular left internal mammary soft tissue 2.8 cm.  Redemonstrated enlarged supraclavicular, lower cervical and lower posterior cervical nodes unchanged.  Interval increase in the bony erosion of the posterior and lateral left third rib, increasing soft tissue lesion eroding the left sternal body 3.1 cm was 2.5 cm.  Bronchiectatic changes   06/19/2019 - 05/05/2020 Chemotherapy   ado-trastuzumab emtansine  (KADCYLA )     06/15/2020 -  Chemotherapy   Xeloda , Tucatinib , Herceptin     04/12/2022 - 09/06/2022 Chemotherapy   Patient is on Treatment Plan : BREAST Trastuzumab  IV (8/6) or SQ (600) D1 q21d     09/27/2022 -  Chemotherapy   Patient is on Treatment Plan : BREAST MAINTENANCE Trastuzumab  IV every 3 weeks (21 days)     11/08/2022 Miscellaneous   Adding low-dose neratinib  (80 mg) to Herceptin    12/20/2022 Cancer Staging   Staging form: Breast, AJCC 7th Edition - Pathologic: Stage IV (M1) - Signed by Crawford Morna Pickle, NP on 12/20/2022   Cancer of left breast metastatic to brain /Initial Ca 1997, Brain Mets 2019  06/10/2019 Initial Diagnosis   Cancer of left breast metastatic to brain [facility])   06/19/2019 - 05/05/2020 Chemotherapy  ado-trastuzumab emtansine  (KADCYLA )     09/27/2022 -  Chemotherapy   Patient is on Treatment Plan : BREAST MAINTENANCE Trastuzumab  IV every 3 weeks (21 days)     Port-A-Cath in place   CNS Oncologic History 01/15/24: Completes R temporal, base of skull RT 30/10 with concurrent avastin  7.5mg /kg q3  weeks 02/20/23: [name] to pons recurrence, R temporal rsxn cavity.  Concurrent avastin  initiated q3 weeks 12/29/22: Craniotomy, resection R temporal (Fecci).  Path is 70% neoplasm 11/04/22: Post-op frx [name] L occipital Cinda) 10/06/22: Craniotomy, resection L occipital at [facility]), path is neoplasm 10/12/21: [name] to pons 16 Gy Cinda) 06/29/21: [name] to 2x L occipital 03/17/21: [name] x3 08/27/20: [name] x5 05/13/20: [name] x4  03/25/19: [name] right frontal 5 mm 09/04/18: [name] x2 right frontal 12/18/17: [name] R parietal 09/04/17: Post op [name] R frontal, 5 fxs 08/04/17: Craniotomy, resection R frontal met ([name])  Interval History: Melissa [name] presents today for follow up after recent MRI brain.  She completed an additional 2 weeks course of radiation in July, and has been dosing avastin  alongside her every 3 week Kanjinti  infusions since that time with Dr. Gudena.  No new or progressive neurologic complaints today.  She feels well today, still has right sided visual impairment, left sided facial numbness.  No seizures or headaches.    Prior: Melissa [name] for recurrent brainstem metastasis and right temporal resection cavity.   She describes several weeks history of coughing up blood, including large clots.  She has been evaluated by pulmonology, etiology is unclear.  Does report an increase in fatigue and lethargy since symptom onset.  Ohterwise no new or progressive neurologic complaints; she continues to experience numbness and tingling in her left hand since surgery in June.  Walking independently, no seizures or headaches.  Prior: She describes some lingering tingling of fingertips in her left hand, but this has actually improved in recent days.  There are still head pain sensations following surgery, described as lightning bolt feeling which lasts for 3-5 seconds, occurs 2-3x per hour.  This has not improved with Tylenol  or other pain medications.  Otherwise no new or progressive  neurologic symptoms, remains functionally intact and independent with gait.  Continues on Kanjinti  with Dr. Gudena, tolerating it well.  Planning upcoming trip to beach with new camper.   She describes recurrence of daily headaches since surgery.  Has been dosing Tylenol  or Aleve daily.  Sleep has been poor at times.  Steroids had been discontinued.  Her right sided visual symptoms have not recurred.  Otherwise no new or progressive changes.  H+P (08/02/22) Patient presents today for headache evaluation.  She describes pain along the temples mild to moderate, every day for the past month.  Prior to this she had very sporadic migraine type headaches, which these differ from.  No associated neurologic deficits.  She does dose codeine  based cough syrup every night (for the past year), and has chronic sleep impairment.  Decadron  2mg  twice per day, started last week, has not helped any symptoms.  Also describes some shadowy visual loss on the right side, at times.  Continues on herceptin  with Dr. Gudena.  Medications: Current Outpatient Medications on File Prior to Visit  Medication Sig Dispense Refill   acetaminophen  (TYLENOL ) 325 MG tablet Take 2 tablets (650 mg total) by mouth every 6 (six) hours as needed for mild pain (or Fever >/= 101). 12 tablet 0   albuterol  (VENTOLIN  HFA) 108 (90 Base) MCG/ACT inhaler Inhale  2 puffs into the lungs every 6 (six) hours as needed for wheezing or shortness of breath (cough). 18 g 5   calcium carbonate (TUMS EX) 750 MG chewable tablet Chew by mouth.     chlorpheniramine -HYDROcodone  (TUSSIONEX) 10-8 MG/5ML Take 5 mLs by mouth 2 (two) times daily. 300 mL 0   dexamethasone  (DECADRON ) 2 MG tablet Take 1 tablet (2 mg total) by mouth daily. 90 tablet 0   escitalopram (LEXAPRO) 10 MG tablet Take 10 mg by mouth daily.     levalbuterol  (XOPENEX ) 0.63 MG/3ML nebulizer solution Take 3 mLs (0.63 mg total) by nebulization every 4 (four) hours as needed for wheezing or shortness of  breath. 90 mL 12   levETIRAcetam  (KEPPRA ) 250 MG tablet Take 1 tablet (250 mg total) by mouth 2 (two) times daily. 60 tablet 2   lidocaine -prilocaine  (EMLA ) cream APPLY EXTERNALLY TO THE AFFECTED AREA 1 TIME 30 g 3   Multiple Vitamins-Minerals (EMERGEN-C VITAMIN C PO) Take by mouth.     naproxen sodium (ALEVE) 220 MG tablet Take by mouth.     ondansetron  (ZOFRAN -ODT) 4 MG disintegrating tablet Take 4 mg by mouth every 8 (eight) hours as needed.     predniSONE  (DELTASONE ) 50 MG tablet Take 1 tablet 13 hrs prior to scan, 2nd tablet 7 hrs prior, 3rd tablet 1 hr prior (Patient not taking: Reported on 04/18/2024) 3 tablet 1   SYMBICORT  160-4.5 MCG/ACT inhaler Inhale 2 puffs into the lungs 2 (two) times daily.     Current Facility-Administered Medications on File Prior to Visit  Medication Dose Route Frequency Provider Last Rate Last Admin   alteplase  (CATHFLO ACTIVASE ) injection 2 mg  2 mg Intracatheter Once PRN Gudena, Vinay, MD       sodium chloride  flush (NS) 0.9 % injection 10 mL  10 mL Intracatheter PRN Odean Potts, MD   10 mL at 07/06/21 1428    Allergies:  Allergies  Allergen Reactions   Aspirin Anaphylaxis and Shortness Of Breath    THROAT CLOSES   Protonix  [Pantoprazole ] Nausea Only and Other (See Comments)    Also caused a film in the mouth and caused chest pressure   Doxycycline  Other (See Comments)    Hallucinations, headaches   Promethazine -Codeine  Cough    Worsening cough and insomnia    Sulfamethoxazole-Trimethoprim  Other (See Comments)   Iodinated Contrast Media Rash    Patient allergic to contrast used in radiation oncology for CT simulation    Past Medical History:  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)    2019   Cancer,  metastatic to liver (HCC)    2021   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)    Coccidioidomycosis 11/05/2023   Complication of anesthesia    bp dropped + desat    COPD (chronic obstructive pulmonary disease) (HCC)    Dyspnea    DOE   GERD (gastroesophageal reflux disease)    H/O coccidioidomycosis    was reason for lung lobectomy   Headache(784.0)    due to eye strain or not eating   Hemoptysis 05/08/2023   History of anemia    no current problem   History of asthma  as a child   History of breast cancer 2014   left   History of chemotherapy    finished 07/17/2013   History of hiatal hernia    AGE 35   Hx of radiation therapy 03/25/13-05/06/13   left breast 5000 cGy/25 sessions, left breast boost 1000 cGy/5 sessions   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   Personal history of chemotherapy    for liver cancer   Personal history of radiation therapy    Pneumonia    LAST FLARE UP 01/2018   Rash 02/22/2021   Runny nose 07/30/2013   clear drainage   Smoker 05/24/2021   Wears dentures    upper   Past Surgical History:  Past Surgical History:  Procedure Laterality Date   APPLICATION OF CRANIAL NAVIGATION N/A 08/04/2017   Procedure: APPLICATION OF CRANIAL NAVIGATION;  Surgeon: [name], Morene Hicks, MD;  Location: [facility];  Service: Neurosurgery;  Laterality: N/A;   AXILLARY LYMPH NODE DISSECTION Left 02/08/2013   Procedure: LEFT AXILLARY DISSECTION;  Surgeon: Morene ONEIDA Olives, MD;  Location: [facility];  Service: General;  Laterality: Left;   BREAST CYST EXCISION Right 2006   BREAST LUMPECTOMY Left 2014   BREAST LUMPECTOMY WITH NEEDLE LOCALIZATION AND AXILLARY SENTINEL LYMPH NODE BX Left 12/31/2012   Procedure: NEEDLE LOCALIZATION LEFT BREAST LUMPECTOMY AND LEFT AXILLARY SENTENIAL LYMPH NODE BX;  Surgeon: Morene ONEIDA Olives, MD;  Location: [facility];  Service: General;  Laterality: Left;   BRONCHIAL BRUSHINGS  03/17/2020   Procedure: BRONCHIAL BRUSHINGS;  Surgeon:  Brenna Adine CROME, DO;  Location: [facility];  Service: Pulmonary;;   BRONCHIAL WASHINGS  03/17/2020   Procedure: BRONCHIAL WASHINGS;  Surgeon: Brenna Adine CROME, DO;  Location: [facility];  Service: Pulmonary;;   CESAREAN SECTION  1995/1996   CRANIOTOMY Right 08/04/2017   Procedure: Right Frontal craniotomy for resection of tumor with stereotactic navigation;  Surgeon: [name], Morene Hicks, MD;  Location: [facility] OR;  Service: Neurosurgery;  Laterality: Right;  Right Frontal craniotomy for resection of tumor with stereotactic navigation   IR CV LINE INJECTION  02/19/2021   IR CV LINE INJECTION  11/12/2021   IR IMAGING GUIDED PORT INSERTION  05/31/2019   LUNG LOBECTOMY Left 05/1996   upper lobe - due to [facility] Fever   PORT-A-CATH REMOVAL Right 08/02/2013   Procedure: REMOVAL PORT-A-CATH;  Surgeon: Morene ONEIDA Olives, MD;  Location: [facility];  Service: General;  Laterality: Right;   PORTACATH PLACEMENT  07/02/2012   Procedure: INSERTION PORT-A-CATH;  Surgeon: Morene ONEIDA Olives, MD;  Location: [facility];  Service: General;  Laterality: N/A;  right   VIDEO BRONCHOSCOPY N/A 03/17/2020   Procedure: VIDEO BRONCHOSCOPY;  Surgeon: Brenna Adine CROME, DO;  Location: [facility];  Service: Pulmonary;  Laterality: N/A;   VIDEO BRONCHOSCOPY WITH ENDOBRONCHIAL ULTRASOUND N/A 05/23/2018   Procedure: VIDEO BRONCHOSCOPY WITH ENDOBRONCHIAL ULTRASOUND;  Surgeon: Brenna Adine CROME, DO;  Location: [facility];  Service: Thoracic;  Laterality: N/A;   Social History:  Social History   Socioeconomic History   Marital status: Married    Spouse name: Not on file   Number of children: 2   Years of education: Not on file   Highest education level: Not on file  Occupational History   Occupation: Investment banker, corporate  Tobacco Use   Smoking status: Every Day    Current packs/day: 0.30    Average packs/day: 0.3 packs/day for 38.0 years (11.4 ttl pk-yrs)    Types: Cigarettes   Smokeless  tobacco:  Never   Tobacco comments:    1 pack a day 04/18/24 MMP  Vaping Use   Vaping status: Never Used  Substance and Sexual Activity   Alcohol use: No   Drug use: No   Sexual activity: Not Currently  Other Topics Concern   Not on file  Social History Narrative   Not on file   Social Drivers of Health   Financial Resource Strain: Low Risk  (01/03/2023)   Received from [facility]   Overall Financial Resource Strain (CARDIA)    Difficulty of Paying Living Expenses: Not hard at all  Food Insecurity: No Food Insecurity (12/20/2023)   Hunger Vital Sign    Worried About Running Out of Food in the Last Year: Never true    Ran Out of Food in the Last Year: Never true  Transportation Needs: No Transportation Needs (12/20/2023)   PRAPARE - Administrator, Civil Service (Medical): No    Lack of Transportation (Non-Medical): No  Physical Activity: Not on file  Stress: Not on file  Social Connections: Not on file  Intimate Partner Violence: Not At Risk (12/20/2023)   Humiliation, Afraid, Rape, and Kick questionnaire    Fear of Current or Ex-Partner: No    Emotionally Abused: No    Physically Abused: No    Sexually Abused: No   Family History:  Family History  Problem Relation Age of Onset   Cancer Mother        breast   Emphysema Mother        was a smoker   Heart disease Mother    Melanoma Mother        dx in her 56s   Breast cancer Mother 59   Asthma Brother    Cancer Cousin        breast   Breast cancer Cousin 20 - 12       mother's maternal cousin    Review of Systems: Constitutional: Doesn't report fevers, chills or abnormal weight loss Eyes: Doesn't report blurriness of vision Ears, nose, mouth, throat, and face: Doesn't report sore throat Respiratory: Doesn't report cough, dyspnea or wheezes Cardiovascular: Doesn't report palpitation, chest discomfort  Gastrointestinal:  Doesn't report nausea, constipation, diarrhea GU: Doesn't report  incontinence Skin: Doesn't report skin rashes Neurological: Per HPI Musculoskeletal: Doesn't report joint pain Behavioral/Psych: Doesn't report anxiety  Physical Exam: Vitals:   04/22/24 1026  BP: 121/73  Pulse: 71  Resp: 16  Temp: 97.8 F (36.6 C)  SpO2: 97%    KPS: 80. General: Alert, cooperative, pleasant, in no acute distress Head: Normal EENT: No conjunctival injection or scleral icterus.  Lungs: Resp effort normal Cardiac: Regular rate Abdomen: Non-distended abdomen Skin: No rashes cyanosis or petechiae. Extremities: No clubbing or edema  Neurologic Exam: Mental Status: Awake, alert, attentive to examiner. Oriented to self and environment. Language is fluent with intact comprehension.  Cranial Nerves: Visual acuity is grossly normal. Visual fields are full. Extra-ocular movements intact. No ptosis. Face is symmetric Motor: Tone and bulk are normal. Power is full in both arms and legs. Reflexes are symmetric, no pathologic reflexes present.  Sensory: Intact to light touch Gait: Normal.   Labs: I have reviewed the data as listed    Component Value Date/Time   NA 137 04/02/2024 0834   NA 142 08/12/2014 0956   K 3.7 04/02/2024 0834   K 4.6 08/12/2014 0956   CL 104 04/02/2024 0834   CL 100 12/20/2012 1509  CO2 29 04/02/2024 0834   CO2 26 08/12/2014 0956   GLUCOSE 72 04/02/2024 0834   GLUCOSE 94 08/12/2014 0956   GLUCOSE 98 12/20/2012 1509   BUN 14 04/02/2024 0834   BUN 10.1 08/12/2014 0956   CREATININE 0.61 04/02/2024 0834   CREATININE 0.83 03/28/2023 1051   CREATININE 0.9 08/12/2014 0956   CALCIUM 9.3 04/02/2024 0834   CALCIUM 9.3 08/12/2014 0956   PROT 7.0 04/02/2024 0834   PROT 7.1 08/12/2014 0956   ALBUMIN 3.4 (L) 04/02/2024 0834   ALBUMIN 3.8 08/12/2014 0956   AST 11 (L) 04/02/2024 0834   AST 15 08/12/2014 0956   ALT 13 04/02/2024 0834   ALT 11 08/12/2014 0956   ALKPHOS 68 04/02/2024 0834   ALKPHOS 68 08/12/2014 0956   BILITOT 0.2 04/02/2024  0834   BILITOT 0.25 08/12/2014 0956   GFRNONAA >60 04/02/2024 0834   GFRNONAA 76 03/11/2019 1057   GFRAA >60 03/24/2020 1012   GFRAA >60 02/11/2020 0836   GFRAA 88 03/11/2019 1057   Lab Results  Component Value Date   WBC 15.3 (H) 04/02/2024   NEUTROABS 10.7 (H) 04/02/2024   HGB 9.7 (L) 04/02/2024   HCT 29.9 (L) 04/02/2024   MCV 77.7 (L) 04/02/2024   PLT 392 04/02/2024    Imaging:  MR Brain W Wo Contrast Result Date: 04/16/2024 EXAM: MRI BRAIN WITHOUT AND WITH CONTRAST 04/16/2024 01:11:50 PM TECHNIQUE: Multiplanar multisequence MRI of the head/brain was performed without and with the administration of intravenous contrast. COMPARISON: MRI head 12/14/2023 CLINICAL HISTORY: Brain metastases, assess treatment response; 3T [name] Protocol. Multiple courses of [name] and recent whole brain course. [name] RE STAGING PROTOCOL; Mri brain w wo; 5 ml vueway ; 7.5 ml vial; 2.5 ml wasted; Hx brain mets/ assess treatment/ s/p radiation. FINDINGS: BRAIN AND VENTRICLES: Minimal decrease in multiple enhancing lesions including: Punctate lesion in the posterior left temporal lobe (series 14, image 59). More ill defined 8 mm lesion in the pons (series 14, 48) . Slightly decreased bulk of enhancement along the anterior right temporal lobe (series 14, 43). Decreased bulk of extra-axial enhancement along the anterior right falx at the vertex (series 14, image 13). Mildly decreased size of 6 mm cerebellar lesion (series 14, image 62). The edema surrounding these lesions is either unchanged or mildly improved. No new areas of enhancement. No acute infarct. No acute intracranial hemorrhage. No mass effect or midline shift. No hydrocephalus. Normal flow voids. ORBITS: No acute abnormality. SINUSES: No acute abnormality. BONES AND SOFT TISSUES: NoNo focal worrisome lesion. Right frontal and left parietooccipital cranioplasties. IMPRESSION: 1. Interval decrease in size in multiple enhancing lesions as detailed above. 2. No new  lesions identified. Electronically signed by: Gilmore Molt MD 04/16/2024 11:15 PM EDT RP Workstation: [id number]    [facility] Clinician Interpretation: I have personally reviewed the radiological images as listed.  My interpretation, in the context of the patient's clinical presentation, is stable disease   Assessment/Plan Metastasis to brain [facility])  Burnard HERO Eguia is clinically stable today, now having completed right temporal and base of skull radiation, concurrent avastin  7.5mg /kg x5 cycles.  MRI brain demonstrates very good response to these therapies, with marked decrease in burden of enhancement.   Recommended discontinuing avastin  and continuing imaging surveillance only at this time.  She remains at high risk for radiation necrosis, but we will likely reserve any re-initiation of avastin  for clinical rather than radiographic progression.  Decadron  may reduce to 1mg  daily.  We appreciate the opportunity to participate in  the care of Melissa [name].   We ask that Melissa [name] return to clinic in 3 months following next brain MRI, or sooner as needed.  All questions were answered. The patient knows to call the clinic with any problems, questions or concerns. No barriers to learning were detected.  The total time spent in the encounter was 40 minutes and more than 50% was on counseling and review of test results   Arthea MARLA Manns, MD Medical Director of Neuro-Oncology [facility] 04/22/24 10:22 AM

## 2024-04-23 ENCOUNTER — Inpatient Hospital Stay

## 2024-04-23 ENCOUNTER — Encounter: Payer: Self-pay | Admitting: Hematology and Oncology

## 2024-04-23 ENCOUNTER — Inpatient Hospital Stay: Admitting: Hematology and Oncology

## 2024-04-23 VITALS — BP 122/57 | HR 67 | Temp 98.4°F | Resp 16

## 2024-04-23 DIAGNOSIS — C50212 Malignant neoplasm of upper-inner quadrant of left female breast: Secondary | ICD-10-CM

## 2024-04-23 DIAGNOSIS — C50912 Malignant neoplasm of unspecified site of left female breast: Secondary | ICD-10-CM

## 2024-04-23 DIAGNOSIS — Z5112 Encounter for antineoplastic immunotherapy: Secondary | ICD-10-CM | POA: Diagnosis not present

## 2024-04-23 LAB — CBC WITH DIFFERENTIAL (CANCER CENTER ONLY)
Abs Immature Granulocytes: 0.06 K/uL (ref 0.00–0.07)
Basophils Absolute: 0.1 K/uL (ref 0.0–0.1)
Basophils Relative: 0 %
Eosinophils Absolute: 0.2 K/uL (ref 0.0–0.5)
Eosinophils Relative: 1 %
HCT: 31 % — ABNORMAL LOW (ref 36.0–46.0)
Hemoglobin: 9.9 g/dL — ABNORMAL LOW (ref 12.0–15.0)
Immature Granulocytes: 0 %
Lymphocytes Relative: 18 %
Lymphs Abs: 2.7 K/uL (ref 0.7–4.0)
MCH: 25.3 pg — ABNORMAL LOW (ref 26.0–34.0)
MCHC: 31.9 g/dL (ref 30.0–36.0)
MCV: 79.3 fL — ABNORMAL LOW (ref 80.0–100.0)
Monocytes Absolute: 1.1 K/uL — ABNORMAL HIGH (ref 0.1–1.0)
Monocytes Relative: 7 %
Neutro Abs: 11 K/uL — ABNORMAL HIGH (ref 1.7–7.7)
Neutrophils Relative %: 74 %
Platelet Count: 358 K/uL (ref 150–400)
RBC: 3.91 MIL/uL (ref 3.87–5.11)
RDW: 20.8 % — ABNORMAL HIGH (ref 11.5–15.5)
WBC Count: 15.1 K/uL — ABNORMAL HIGH (ref 4.0–10.5)
nRBC: 0 % (ref 0.0–0.2)

## 2024-04-23 LAB — TOTAL PROTEIN, URINE DIPSTICK: Protein, ur: NEGATIVE mg/dL

## 2024-04-23 MED ORDER — TRASTUZUMAB-ANNS CHEMO 150 MG IV SOLR
6.0000 mg/kg | Freq: Once | INTRAVENOUS | Status: AC
  Administered 2024-04-23: 300 mg via INTRAVENOUS
  Filled 2024-04-23: qty 14.29

## 2024-04-23 MED ORDER — SODIUM CHLORIDE 0.9% FLUSH
10.0000 mL | INTRAVENOUS | Status: DC | PRN
  Administered 2024-04-23: 10 mL

## 2024-04-23 MED ORDER — ACETAMINOPHEN 325 MG PO TABS
650.0000 mg | ORAL_TABLET | Freq: Once | ORAL | Status: AC
  Administered 2024-04-23: 650 mg via ORAL
  Filled 2024-04-23: qty 2

## 2024-04-23 MED ORDER — SODIUM CHLORIDE 0.9 % IV SOLN: Freq: Once | INTRAVENOUS | Status: AC

## 2024-04-23 MED ORDER — DIPHENHYDRAMINE HCL 25 MG PO CAPS
25.0000 mg | ORAL_CAPSULE | Freq: Once | ORAL | Status: AC
  Administered 2024-04-23: 25 mg via ORAL
  Filled 2024-04-23: qty 1

## 2024-04-23 NOTE — Patient Instructions (Signed)
 [facility] - A DEPT OF [facility]  Discharge Instructions: Thank you for choosing [facility] to provide your oncology and hematology care.   If you have a lab appointment with the Cancer Center, please go directly to the Cancer Center and check in at the registration area.   Wear comfortable clothing and clothing appropriate for easy access to any Portacath or PICC line.   We strive to give you quality time with your provider. You may need to reschedule your appointment if you arrive late (15 or more minutes).  Arriving late affects you and other patients whose appointments are after yours.  Also, if you miss three or more appointments without notifying the office, you may be dismissed from the clinic at the provider's discretion.      For prescription refill requests, have your pharmacy contact our office and allow 72 hours for refills to be completed.    Today you received the following chemotherapy and/or immunotherapy agents kanjinti      To help prevent nausea and vomiting after your treatment, we encourage you to take your nausea medication as directed.  BELOW ARE SYMPTOMS THAT SHOULD BE REPORTED IMMEDIATELY: *FEVER GREATER THAN 100.4 F (38 C) OR HIGHER *CHILLS OR SWEATING *NAUSEA AND VOMITING THAT IS NOT CONTROLLED WITH YOUR NAUSEA MEDICATION *UNUSUAL SHORTNESS OF BREATH *UNUSUAL BRUISING OR BLEEDING *URINARY PROBLEMS (pain or burning when urinating, or frequent urination) *BOWEL PROBLEMS (unusual diarrhea, constipation, pain near the anus) TENDERNESS IN MOUTH AND THROAT WITH OR WITHOUT PRESENCE OF ULCERS (sore throat, sores in mouth, or a toothache) UNUSUAL RASH, SWELLING OR PAIN  UNUSUAL VAGINAL DISCHARGE OR ITCHING   Items with * indicate a potential emergency and should be followed up as soon as possible or go to the Emergency Department if any problems should occur.  Please show the CHEMOTHERAPY ALERT CARD or IMMUNOTHERAPY  ALERT CARD at check-in to the Emergency Department and triage nurse.  Should you have questions after your visit or need to cancel or reschedule your appointment, please contact [facility] - A DEPT OF [facility]  Dept: [phone]  and follow the prompts.  Office hours are 8:00 a.m. to 4:30 p.m. Monday - Friday. Please note that voicemails left after 4:00 p.m. may not be returned until the following business day.  We are closed weekends and major holidays. You have access to a nurse at all times for urgent questions. Please call the main number to the clinic Dept: [phone] and follow the prompts.   For any non-urgent questions, you may also contact your provider using MyChart. We now offer e-Visits for anyone 81 and older to request care online for non-urgent symptoms. For details visit [email or web address].   Also download the MyChart app! Go to the app store, search "MyChart", open the app, select [facility], and log in with your MyChart username and password.

## 2024-04-25 ENCOUNTER — Other Ambulatory Visit: Payer: Self-pay | Admitting: Hematology and Oncology

## 2024-04-25 MED ORDER — HYDROCOD POLI-CHLORPHE POLI ER 10-8 MG/5ML PO SUER: 5.0000 mL | Freq: Two times a day (BID) | ORAL | 0 refills | Status: DC

## 2024-04-26 ENCOUNTER — Telehealth: Payer: Self-pay | Admitting: Internal Medicine

## 2024-04-26 NOTE — Telephone Encounter (Signed)
 Scheduled next appointment. Called and left a voicemail with appointment details.

## 2024-04-28 ENCOUNTER — Other Ambulatory Visit: Payer: Self-pay

## 2024-05-06 ENCOUNTER — Other Ambulatory Visit: Payer: Self-pay | Admitting: *Deleted

## 2024-05-06 DIAGNOSIS — C7931 Secondary malignant neoplasm of brain: Secondary | ICD-10-CM

## 2024-05-06 MED ORDER — LEVETIRACETAM 250 MG PO TABS: 250.0000 mg | ORAL_TABLET | Freq: Two times a day (BID) | ORAL | 0 refills | Status: DC

## 2024-05-09 NOTE — Progress Notes (Deleted)
 Subjective:  Chief Complaint: followup for possible pulmomarty  Patient ID: Melissa [name], female    DOB: 1966-06-08, 58 y.o.   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)    Coccidioidomycosis 11/05/2023   Complication of anesthesia    bp dropped + desat    COPD (chronic obstructive pulmonary disease) (HCC)    Dyspnea    DOE   GERD (gastroesophageal reflux disease)    H/O coccidioidomycosis    was reason for lung lobectomy   Headache(784.0)    due to eye strain or not eating   Hemoptysis 05/08/2023   History of anemia    no current problem   History of asthma    as a child   History of breast cancer 2014   left   History of chemotherapy    finished 07/17/2013   History of hiatal hernia    AGE 57   Hx of radiation therapy 03/25/13-05/06/13   left breast 5000 cGy/25 sessions, left breast boost 1000 cGy/5 sessions   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   Personal history of chemotherapy    for liver cancer   Personal history of radiation therapy    Pneumonia    LAST FLARE UP 01/2018   Rash 02/22/2021   Runny nose 07/30/2013   clear drainage   Smoker 05/24/2021   Wears dentures    upper    Past Surgical History:  Procedure Laterality Date   APPLICATION OF CRANIAL NAVIGATION N/A 08/04/2017   Procedure: APPLICATION OF CRANIAL NAVIGATION;  Surgeon: [name], Morene Hicks, MD;  Location: [facility];  Service: Neurosurgery;  Laterality: N/A;   AXILLARY LYMPH NODE DISSECTION Left 02/08/2013   Procedure: LEFT AXILLARY DISSECTION;  Surgeon: Morene ONEIDA Olives, MD;  Location: [facility];  Service: General;  Laterality: Left;   BREAST  CYST EXCISION Right 2006   BREAST LUMPECTOMY Left 2014   BREAST LUMPECTOMY WITH NEEDLE LOCALIZATION AND AXILLARY SENTINEL LYMPH NODE BX Left 12/31/2012   Procedure: NEEDLE LOCALIZATION LEFT BREAST LUMPECTOMY AND LEFT AXILLARY SENTENIAL LYMPH NODE BX;  Surgeon: Morene ONEIDA Olives, MD;  Location: [facility];  Service: General;  Laterality: Left;   BRONCHIAL BRUSHINGS  03/17/2020   Procedure: BRONCHIAL BRUSHINGS;  Surgeon: Brenna Adine CROME, DO;  Location: [facility];  Service: Pulmonary;;   BRONCHIAL WASHINGS  03/17/2020   Procedure: BRONCHIAL WASHINGS;  Surgeon: Brenna Adine CROME, DO;  Location: [facility];  Service: Pulmonary;;   CESAREAN SECTION  1995/1996   CRANIOTOMY Right 08/04/2017   Procedure: Right Frontal craniotomy for resection of tumor with stereotactic navigation;  Surgeon: [name], Morene Hicks, MD;  Location: [facility] OR;  Service: Neurosurgery;  Laterality: Right;  Right Frontal craniotomy for resection of tumor with stereotactic navigation   IR CV LINE INJECTION  02/19/2021   IR CV LINE INJECTION  11/12/2021   IR IMAGING GUIDED PORT INSERTION  05/31/2019   LUNG LOBECTOMY Left 05/1996   upper lobe - due to [facility] Fever   PORT-A-CATH REMOVAL Right 08/02/2013  Procedure: REMOVAL PORT-A-CATH;  Surgeon: Morene ONEIDA Olives, MD;  Location: [facility];  Service: General;  Laterality: Right;   PORTACATH PLACEMENT  07/02/2012   Procedure: INSERTION PORT-A-CATH;  Surgeon: Morene ONEIDA Olives, MD;  Location: [facility];  Service: General;  Laterality: N/A;  right   VIDEO BRONCHOSCOPY N/A 03/17/2020   Procedure: VIDEO BRONCHOSCOPY;  Surgeon: Brenna Adine CROME, DO;  Location: [facility];  Service: Pulmonary;  Laterality: N/A;   VIDEO BRONCHOSCOPY WITH ENDOBRONCHIAL ULTRASOUND N/A 05/23/2018   Procedure: VIDEO BRONCHOSCOPY WITH ENDOBRONCHIAL ULTRASOUND;  Surgeon: Brenna Adine CROME, DO;  Location: [facility];  Service: Thoracic;  Laterality: N/A;    Family History   Problem Relation Age of Onset   Cancer Mother        breast   Emphysema Mother        was a smoker   Heart disease Mother    Melanoma Mother        dx in her 57s   Breast cancer Mother 15   Asthma Brother    Cancer Cousin        breast   Breast cancer Cousin 65 - 10       mother's maternal cousin      Social History   Socioeconomic History   Marital status: Married    Spouse name: Not on file   Number of children: 2   Years of education: Not on file   Highest education level: Not on file  Occupational History   Occupation: Investment Banker, Corporate  Tobacco Use   Smoking status: Every Day    Current packs/day: 0.30    Average packs/day: 0.3 packs/day for 38.0 years (11.4 ttl pk-yrs)    Types: Cigarettes   Smokeless tobacco: Never   Tobacco comments:    1 pack a day 04/18/24 MMP  Vaping Use   Vaping status: Never Used  Substance and Sexual Activity   Alcohol use: No   Drug use: No   Sexual activity: Not Currently  Other Topics Concern   Not on file  Social History Narrative   Not on file   Social Drivers of Health   Financial Resource Strain: Low Risk  (01/03/2023)   Received from [facility]   Overall Financial Resource Strain (CARDIA)    Difficulty of Paying Living Expenses: Not hard at all  Food Insecurity: No Food Insecurity (12/20/2023)   Hunger Vital Sign    Worried About Running Out of Food in the Last Year: Never true    Ran Out of Food in the Last Year: Never true  Transportation Needs: No Transportation Needs (12/20/2023)   PRAPARE - Administrator, Civil Service (Medical): No    Lack of Transportation (Non-Medical): No  Physical Activity: Not on file  Stress: Not on file  Social Connections: Not on file    Allergies  Allergen Reactions   Aspirin Anaphylaxis and Shortness Of Breath    THROAT CLOSES   Protonix  [Pantoprazole ] Nausea Only and Other (See Comments)    Also caused a film in the mouth and caused chest  pressure   Doxycycline  Other (See Comments)    Hallucinations, headaches   Promethazine -Codeine  Cough    Worsening cough and insomnia    Sulfamethoxazole-Trimethoprim  Other (See Comments)   Iodinated Contrast Media Rash    Patient allergic to contrast used in radiation oncology for CT simulation      Current Outpatient Medications:    acetaminophen  (TYLENOL ) 325 MG  tablet, Take 2 tablets (650 mg total) by mouth every 6 (six) hours as needed for mild pain (or Fever >/= 101)., Disp: 12 tablet, Rfl: 0   albuterol  (VENTOLIN  HFA) 108 (90 Base) MCG/ACT inhaler, Inhale 2 puffs into the lungs every 6 (six) hours as needed for wheezing or shortness of breath (cough)., Disp: 18 g, Rfl: 5   calcium carbonate (TUMS EX) 750 MG chewable tablet, Chew by mouth., Disp: , Rfl:    chlorpheniramine -HYDROcodone  (TUSSIONEX) 10-8 MG/5ML, Take 5 mLs by mouth 2 (two) times daily., Disp: 300 mL, Rfl: 0   dexamethasone  (DECADRON ) 2 MG tablet, Take 1 tablet (2 mg total) by mouth daily., Disp: 90 tablet, Rfl: 0   escitalopram (LEXAPRO) 10 MG tablet, Take 10 mg by mouth daily., Disp: , Rfl:    levalbuterol  (XOPENEX ) 0.63 MG/3ML nebulizer solution, Take 3 mLs (0.63 mg total) by nebulization every 4 (four) hours as needed for wheezing or shortness of breath., Disp: 90 mL, Rfl: 12   levETIRAcetam  (KEPPRA ) 250 MG tablet, Take 1 tablet (250 mg total) by mouth 2 (two) times daily., Disp: 60 tablet, Rfl: 2   levETIRAcetam  (KEPPRA ) 250 MG tablet, Take 1 tablet (250 mg total) by mouth 2 (two) times daily., Disp: 10 tablet, Rfl: 0   lidocaine -prilocaine  (EMLA ) cream, APPLY EXTERNALLY TO THE AFFECTED AREA 1 TIME, Disp: 30 g, Rfl: 3   Multiple Vitamins-Minerals (EMERGEN-C VITAMIN C PO), Take by mouth., Disp: , Rfl:    naproxen sodium (ALEVE) 220 MG tablet, Take by mouth., Disp: , Rfl:    ondansetron  (ZOFRAN -ODT) 4 MG disintegrating tablet, Take 4 mg by mouth every 8 (eight) hours as needed., Disp: , Rfl:    predniSONE  (DELTASONE )  50 MG tablet, Take 1 tablet 13 hrs prior to scan, 2nd tablet 7 hrs prior, 3rd tablet 1 hr prior (Patient not taking: Reported on 04/22/2024), Disp: 3 tablet, Rfl: 1   SYMBICORT  160-4.5 MCG/ACT inhaler, Inhale 2 puffs into the lungs 2 (two) times daily., Disp: , Rfl:  No current facility-administered medications for this visit.  Facility-Administered Medications Ordered in Other Visits:    alteplase  (CATHFLO ACTIVASE ) injection 2 mg, 2 mg, Intracatheter, Once PRN, Gudena, Vinay, MD   sodium chloride  flush (NS) 0.9 % injection 10 mL, 10 mL, Intracatheter, PRN, Gudena, Vinay, MD, 10 mL at 07/06/21 1428    Review of Systems     Objective:   Physical Exam        Assessment & Plan:

## 2024-05-10 DIAGNOSIS — C50919 Malignant neoplasm of unspecified site of unspecified female breast: Secondary | ICD-10-CM | POA: Diagnosis not present

## 2024-05-10 DIAGNOSIS — E7800 Pure hypercholesterolemia, unspecified: Secondary | ICD-10-CM | POA: Diagnosis not present

## 2024-05-13 ENCOUNTER — Ambulatory Visit: Admitting: Infectious Disease

## 2024-05-13 DIAGNOSIS — Z171 Estrogen receptor negative status [ER-]: Secondary | ICD-10-CM

## 2024-05-13 DIAGNOSIS — F17200 Nicotine dependence, unspecified, uncomplicated: Secondary | ICD-10-CM

## 2024-05-13 DIAGNOSIS — B389 Coccidioidomycosis, unspecified: Secondary | ICD-10-CM

## 2024-05-13 DIAGNOSIS — B449 Aspergillosis, unspecified: Secondary | ICD-10-CM

## 2024-05-14 ENCOUNTER — Inpatient Hospital Stay: Admitting: Hematology and Oncology

## 2024-05-14 ENCOUNTER — Inpatient Hospital Stay

## 2024-05-16 ENCOUNTER — Other Ambulatory Visit: Payer: Self-pay | Admitting: Radiation Therapy

## 2024-05-17 ENCOUNTER — Inpatient Hospital Stay (HOSPITAL_BASED_OUTPATIENT_CLINIC_OR_DEPARTMENT_OTHER): Admitting: Adult Health

## 2024-05-17 ENCOUNTER — Encounter: Payer: Self-pay | Admitting: Adult Health

## 2024-05-17 ENCOUNTER — Inpatient Hospital Stay

## 2024-05-17 ENCOUNTER — Inpatient Hospital Stay: Attending: Hematology and Oncology

## 2024-05-17 VITALS — BP 123/78 | HR 77 | Temp 98.3°F | Resp 16 | Wt 112.3 lb

## 2024-05-17 DIAGNOSIS — Z1721 Progesterone receptor positive status: Secondary | ICD-10-CM | POA: Insufficient documentation

## 2024-05-17 DIAGNOSIS — Z171 Estrogen receptor negative status [ER-]: Secondary | ICD-10-CM

## 2024-05-17 DIAGNOSIS — C50212 Malignant neoplasm of upper-inner quadrant of left female breast: Secondary | ICD-10-CM

## 2024-05-17 DIAGNOSIS — Z5112 Encounter for antineoplastic immunotherapy: Secondary | ICD-10-CM | POA: Insufficient documentation

## 2024-05-17 DIAGNOSIS — C787 Secondary malignant neoplasm of liver and intrahepatic bile duct: Secondary | ICD-10-CM | POA: Diagnosis not present

## 2024-05-17 DIAGNOSIS — C50912 Malignant neoplasm of unspecified site of left female breast: Secondary | ICD-10-CM

## 2024-05-17 DIAGNOSIS — C7931 Secondary malignant neoplasm of brain: Secondary | ICD-10-CM

## 2024-05-17 DIAGNOSIS — Z170 Estrogen receptor positive status [ER+]: Secondary | ICD-10-CM | POA: Diagnosis not present

## 2024-05-17 DIAGNOSIS — C7951 Secondary malignant neoplasm of bone: Secondary | ICD-10-CM | POA: Insufficient documentation

## 2024-05-17 DIAGNOSIS — Z1731 Human epidermal growth factor receptor 2 positive status: Secondary | ICD-10-CM | POA: Diagnosis not present

## 2024-05-17 LAB — CBC WITH DIFFERENTIAL (CANCER CENTER ONLY)
Abs Immature Granulocytes: 0.05 K/uL (ref 0.00–0.07)
Basophils Absolute: 0.1 K/uL (ref 0.0–0.1)
Basophils Relative: 1 %
Eosinophils Absolute: 0.3 K/uL (ref 0.0–0.5)
Eosinophils Relative: 2 %
HCT: 32.8 % — ABNORMAL LOW (ref 36.0–46.0)
Hemoglobin: 10.4 g/dL — ABNORMAL LOW (ref 12.0–15.0)
Immature Granulocytes: 0 %
Lymphocytes Relative: 22 %
Lymphs Abs: 3.1 K/uL (ref 0.7–4.0)
MCH: 25.6 pg — ABNORMAL LOW (ref 26.0–34.0)
MCHC: 31.7 g/dL (ref 30.0–36.0)
MCV: 80.6 fL (ref 80.0–100.0)
Monocytes Absolute: 0.9 K/uL (ref 0.1–1.0)
Monocytes Relative: 7 %
Neutro Abs: 9.3 K/uL — ABNORMAL HIGH (ref 1.7–7.7)
Neutrophils Relative %: 68 %
Platelet Count: 403 K/uL — ABNORMAL HIGH (ref 150–400)
RBC: 4.07 MIL/uL (ref 3.87–5.11)
RDW: 19.9 % — ABNORMAL HIGH (ref 11.5–15.5)
WBC Count: 13.7 K/uL — ABNORMAL HIGH (ref 4.0–10.5)
nRBC: 0 % (ref 0.0–0.2)

## 2024-05-17 MED ORDER — SODIUM CHLORIDE 0.9 % IV SOLN: Freq: Once | INTRAVENOUS | Status: AC

## 2024-05-17 MED ORDER — DIPHENHYDRAMINE HCL 25 MG PO CAPS
25.0000 mg | ORAL_CAPSULE | Freq: Once | ORAL | Status: AC
  Administered 2024-05-17: 25 mg via ORAL
  Filled 2024-05-17: qty 1

## 2024-05-17 MED ORDER — SODIUM CHLORIDE 0.9 % IV SOLN: INTRAVENOUS | Status: DC

## 2024-05-17 MED ORDER — ACETAMINOPHEN 325 MG PO TABS
650.0000 mg | ORAL_TABLET | Freq: Once | ORAL | Status: AC
  Administered 2024-05-17: 650 mg via ORAL
  Filled 2024-05-17: qty 2

## 2024-05-17 MED ORDER — TRASTUZUMAB-ANNS CHEMO 150 MG IV SOLR
6.0000 mg/kg | Freq: Once | INTRAVENOUS | Status: AC
  Administered 2024-05-17: 300 mg via INTRAVENOUS
  Filled 2024-05-17: qty 14.29

## 2024-05-17 NOTE — Patient Instructions (Signed)
 [facility] - A DEPT OF [facility]  Discharge Instructions: Thank you for choosing [facility] to provide your oncology and hematology care.   If you have a lab appointment with the Cancer Center, please go directly to the Cancer Center and check in at the registration area.   Wear comfortable clothing and clothing appropriate for easy access to any Portacath or PICC line.   We strive to give you quality time with your provider. You may need to reschedule your appointment if you arrive late (15 or more minutes).  Arriving late affects you and other patients whose appointments are after yours.  Also, if you miss three or more appointments without notifying the office, you may be dismissed from the clinic at the provider's discretion.      For prescription refill requests, have your pharmacy contact our office and allow 72 hours for refills to be completed.    Today you received the following chemotherapy and/or immunotherapy agents: Trastuzumab -anns (Kanjinti )    To help prevent nausea and vomiting after your treatment, we encourage you to take your nausea medication as directed.  BELOW ARE SYMPTOMS THAT SHOULD BE REPORTED IMMEDIATELY: *FEVER GREATER THAN 100.4 F (38 C) OR HIGHER *CHILLS OR SWEATING *NAUSEA AND VOMITING THAT IS NOT CONTROLLED WITH YOUR NAUSEA MEDICATION *UNUSUAL SHORTNESS OF BREATH *UNUSUAL BRUISING OR BLEEDING *URINARY PROBLEMS (pain or burning when urinating, or frequent urination) *BOWEL PROBLEMS (unusual diarrhea, constipation, pain near the anus) TENDERNESS IN MOUTH AND THROAT WITH OR WITHOUT PRESENCE OF ULCERS (sore throat, sores in mouth, or a toothache) UNUSUAL RASH, SWELLING OR PAIN  UNUSUAL VAGINAL DISCHARGE OR ITCHING   Items with * indicate a potential emergency and should be followed up as soon as possible or go to the Emergency Department if any problems should occur.  Please show the CHEMOTHERAPY ALERT CARD  or IMMUNOTHERAPY ALERT CARD at check-in to the Emergency Department and triage nurse.  Should you have questions after your visit or need to cancel or reschedule your appointment, please contact [facility] - A DEPT OF [facility]  Dept: [phone]  and follow the prompts.  Office hours are 8:00 a.m. to 4:30 p.m. Monday - Friday. Please note that voicemails left after 4:00 p.m. may not be returned until the following business day.  We are closed weekends and major holidays. You have access to a nurse at all times for urgent questions. Please call the main number to the clinic Dept: [phone] and follow the prompts.   For any non-urgent questions, you may also contact your provider using MyChart. We now offer e-Visits for anyone 57 and older to request care online for non-urgent symptoms. For details visit [email or web address].   Also download the MyChart app! Go to the app store, search MyChart, open the app, select [facility], and log in with your MyChart username and password.

## 2024-05-17 NOTE — Progress Notes (Signed)
 [facility] Cancer Follow up:    [name], MD 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 ---s/p Lumpectomy/ /Initial Ca 1997, Brain Mets 2019 Staging form: Breast, AJCC 7th Edition - Clinical: Stage IIA (T2, N0, cM0) - Unsigned Histopathologic type: 9931 Laterality: Left Tumor size (mm): 13 Staging comments: Staged at breast conference 12.4.13  - Pathologic: Stage IIA (T1c, N1, cM0) - Unsigned Histopathologic type: 9931 Laterality: Left Tumor size (mm): 13 - Pathologic: Stage IV (M1) - Signed by Crawford Morna Pickle, NP on 12/20/2022    SUMMARY OF ONCOLOGIC HISTORY: Oncology History  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 Ki-67 was 70% HER-2 was amplified with a ratio 2.91   07/12/2012 - 07/17/2013 Neo-Adjuvant Chemotherapy   TCH 6 followed by Herceptin  maintenance   08/14/2012 Genetic Testing   Negative genetic testing on the [facility].  The report date is August 14, 2012.  The [facility] gene panel offered by [facility] includes sequencing and deletion/duplication testing of the following 25 genes: APC, ATM, , BARD1, BMPR1A, BRCA1, BRCA2, BRIP1, CHD1, CDK4, CDKN2A, CHEK2, EPCAM (large rearrangement only), MLH1, MSH2, MSH6, MUTYH, NBN, PALB2, PMS2, PTEN, RAD51C, RAD51D, SMAD4, STK11, and TP53.     12/11/2012 Surgery   Left breast lumpectomy: 1.8 cm tumor 1 positive sentinel node, axillary lymph node dissection 02/08/2013 showed 0/13 lymph nodes   03/25/2013 - 05/06/2013 Radiation Therapy   Adjuvant radiation therapy   06/05/2013 - 07/20/2017 Anti-estrogen oral therapy   Tamoxifen  20 mg  daily   07/27/2017 Relapse/Recurrence   MRI Brain: 3.4 x 2.9 x 2.9 cm RIGHT frontal lobe mass with imaging characteristics of solitary metastasis. Extensive vasogenic edema resulting in 9 mm RIGHT to LEFT midline shift. Equivocal very early LEFT ventricle entrapment.    08/04/2017 Surgery   Rt frontal brain resection: Poorly differentiated tumor IHC suggests breast primary ER and PR Positive   08/25/2017 - 09/04/2017 Radiation Therapy   Stereotactic radiation   09/18/2017 -  Anti-estrogen oral therapy   Lapatinib  with letrozole    12/18/2017 - 12/19/2017 Radiation Therapy   New right parietal lobe metastases status post [facility]   08/27/2018 - 08/27/2020 Radiation Therapy   SRS to new brain metastases   05/09/2019 Relapse/Recurrence   Interval increase in size of the enhancing nodular left internal mammary soft tissue 2.8 cm.  Redemonstrated enlarged supraclavicular, lower cervical and lower posterior cervical nodes unchanged.  Interval increase in the bony erosion of the posterior and lateral left third rib, increasing soft tissue lesion eroding the left sternal body 3.1 cm was 2.5 cm.  Bronchiectatic changes   06/19/2019 - 05/05/2020 Chemotherapy   ado-trastuzumab emtansine  (KADCYLA )     06/15/2020 -  Chemotherapy   Xeloda , Tucatinib , Herceptin     04/12/2022 - 09/06/2022 Chemotherapy   Patient is on Treatment Plan : BREAST Trastuzumab  IV (8/6) or SQ (600) D1 q21d     09/27/2022 -  Chemotherapy   Patient is on Treatment Plan : BREAST MAINTENANCE Trastuzumab  IV every 3 weeks (21 days)     11/08/2022 Miscellaneous   Adding low-dose neratinib  (80 mg) to Herceptin    12/20/2022 Cancer Staging   Staging form:  Breast, AJCC 7th Edition - Pathologic: Stage IV (M1) - Signed by Crawford Morna Pickle, NP on 12/20/2022   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  History of Present Illness Melissa [name] is a 58 year old female who presents for follow-up in hematology and medical oncology.  She has no new issues since her last visit. She continues on Herceptin  every 3 weeks.  Her most recent echo occurred 12/2023 and demonstrated LVEF of 55-60%.  Her next echo is due in December.    She is feeling well and denies any new issues.       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   Port-A-Cath in place 08/05/2019   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 /Initial Ca 1997, Brain Mets 2019 06/10/2019   Mediastinal adenopathy    H/O coccidioidomycosis and Aspergillosus 05/16/2018   DOE (dyspnea on exertion) 05/03/2018   Diarrhea 04/22/2018   Cavitary lesion of lung in area of previous cyts in apex of Sup Segment of LLL 03/29/2018   Aortic atherosclerosis 01/22/2018   Emphysema lung (HCC) 01/22/2018   Metastasis to brain (HCC) 08/04/2017   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 (HCC) 07/27/2017   Nicotine  abuse 07/27/2017   Migraines 07/27/2017    Upper airway cough syndrome 08/10/2016   Abnormal echocardiogram 08/07/2013   Chest tightness or pressure 08/07/2013   Hx of radiation therapy    Edema of left lower extremity 11/19/2012   Tachycardia 09/20/2012   Obstructive bronchiectasis (HCC) 01/26/2011   Cigarette smoker 01/26/2011    is allergic to aspirin, protonix  [pantoprazole ], doxycycline , promethazine -codeine , sulfamethoxazole-trimethoprim , and iodinated contrast media.  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)    Coccidioidomycosis 11/05/2023   Complication of anesthesia    bp dropped + desat    COPD (chronic obstructive pulmonary disease) (HCC)    Dyspnea    DOE   GERD (gastroesophageal reflux disease)    H/O coccidioidomycosis    was reason for lung lobectomy  Headache(784.0)    due to eye strain or not eating   Hemoptysis 05/08/2023   History of anemia    no current problem   History of asthma    as a child   History of breast cancer 2014   left   History of chemotherapy    finished 07/17/2013   History of hiatal hernia    AGE 55   Hx of radiation therapy 03/25/13-05/06/13   left breast 5000 cGy/25 sessions, left breast boost 1000 cGy/5 sessions   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   Personal history of chemotherapy    for liver cancer   Personal history of radiation therapy    Pneumonia    LAST FLARE UP 01/2018   Rash 02/22/2021   Runny nose 07/30/2013   clear drainage   Smoker 05/24/2021   Wears dentures    upper    SURGICAL HISTORY: Past Surgical History:  Procedure Laterality Date   APPLICATION OF CRANIAL NAVIGATION N/A 08/04/2017   Procedure: APPLICATION OF  CRANIAL NAVIGATION;  Surgeon: [name], Morene Hicks, MD;  Location: [facility] OR;  Service: Neurosurgery;  Laterality: N/A;   AXILLARY LYMPH NODE DISSECTION Left 02/08/2013   Procedure: LEFT AXILLARY DISSECTION;  Surgeon: Morene ONEIDA Olives, MD;  Location: [facility];  Service: General;  Laterality: Left;   BREAST CYST EXCISION Right 2006   BREAST LUMPECTOMY Left 2014   BREAST LUMPECTOMY WITH NEEDLE LOCALIZATION AND AXILLARY SENTINEL LYMPH NODE BX Left 12/31/2012   Procedure: NEEDLE LOCALIZATION LEFT BREAST LUMPECTOMY AND LEFT AXILLARY SENTENIAL LYMPH NODE BX;  Surgeon: Morene ONEIDA Olives, MD;  Location: [facility];  Service: General;  Laterality: Left;   BRONCHIAL BRUSHINGS  03/17/2020   Procedure: BRONCHIAL BRUSHINGS;  Surgeon: Brenna Adine CROME, DO;  Location: [facility];  Service: Pulmonary;;   BRONCHIAL WASHINGS  03/17/2020   Procedure: BRONCHIAL WASHINGS;  Surgeon: Brenna Adine CROME, DO;  Location: [facility];  Service: Pulmonary;;   CESAREAN SECTION  1995/1996   CRANIOTOMY Right 08/04/2017   Procedure: Right Frontal craniotomy for resection of tumor with stereotactic navigation;  Surgeon: [name], Morene Hicks, MD;  Location: [facility];  Service: Neurosurgery;  Laterality: Right;  Right Frontal craniotomy for resection of tumor with stereotactic navigation   IR CV LINE INJECTION  02/19/2021   IR CV LINE INJECTION  11/12/2021   IR IMAGING GUIDED PORT INSERTION  05/31/2019   LUNG LOBECTOMY Left 05/1996   upper lobe - due to [facility] Fever   PORT-A-CATH REMOVAL Right 08/02/2013   Procedure: REMOVAL PORT-A-CATH;  Surgeon: Morene ONEIDA Olives, MD;  Location: [facility];  Service: General;  Laterality: Right;   PORTACATH PLACEMENT  07/02/2012   Procedure: INSERTION PORT-A-CATH;  Surgeon: Morene ONEIDA Olives, MD;  Location: [facility];  Service: General;  Laterality: N/A;  right   VIDEO BRONCHOSCOPY N/A 03/17/2020   Procedure: VIDEO BRONCHOSCOPY;  Surgeon:  Brenna Adine CROME, DO;  Location: [facility];  Service: Pulmonary;  Laterality: N/A;   VIDEO BRONCHOSCOPY WITH ENDOBRONCHIAL ULTRASOUND N/A 05/23/2018   Procedure: VIDEO BRONCHOSCOPY WITH ENDOBRONCHIAL ULTRASOUND;  Surgeon: Brenna Adine CROME, DO;  Location: [facility];  Service: Thoracic;  Laterality: N/A;    SOCIAL HISTORY: Social History   Socioeconomic History   Marital status: Married    Spouse name: Not on file   Number of children: 2   Years of education: Not on file   Highest education level: Not on file  Occupational History   Occupation: Investment Banker, Corporate  Tobacco  Use   Smoking status: Every Day    Current packs/day: 0.30    Average packs/day: 0.3 packs/day for 38.0 years (11.4 ttl pk-yrs)    Types: Cigarettes   Smokeless tobacco: Never   Tobacco comments:    1 pack a day 04/18/24 MMP  Vaping Use   Vaping status: Never Used  Substance and Sexual Activity   Alcohol use: No   Drug use: No   Sexual activity: Not Currently  Other Topics Concern   Not on file  Social History Narrative   Not on file   Social Drivers of Health   Financial Resource Strain: Low Risk  (01/03/2023)   Received from [facility]   Overall Financial Resource Strain (CARDIA)    Difficulty of Paying Living Expenses: Not hard at all  Food Insecurity: No Food Insecurity (12/20/2023)   Hunger Vital Sign    Worried About Running Out of Food in the Last Year: Never true    Ran Out of Food in the Last Year: Never true  Transportation Needs: No Transportation Needs (12/20/2023)   PRAPARE - Administrator, Civil Service (Medical): No    Lack of Transportation (Non-Medical): No  Physical Activity: Not on file  Stress: Not on file  Social Connections: Not on file  Intimate Partner Violence: Not At Risk (12/20/2023)   Humiliation, Afraid, Rape, and Kick questionnaire    Fear of Current or Ex-Partner: No    Emotionally Abused: No    Physically Abused: No    Sexually Abused: No     FAMILY HISTORY: Family History  Problem Relation Age of Onset   Cancer Mother        breast   Emphysema Mother        was a smoker   Heart disease Mother    Melanoma Mother        dx in her 72s   Breast cancer Mother 46   Asthma Brother    Cancer Cousin        breast   Breast cancer Cousin 20 - 56       mother's maternal cousin    Review of Systems  Constitutional:  Negative for appetite change, chills, fatigue, fever and unexpected weight change.  HENT:   Negative for hearing loss, lump/mass and trouble swallowing.   Eyes:  Negative for eye problems and icterus.  Respiratory:  Negative for chest tightness, cough and shortness of breath.   Cardiovascular:  Negative for chest pain, leg swelling and palpitations.  Gastrointestinal:  Negative for abdominal distention, abdominal pain, constipation, diarrhea, nausea and vomiting.  Endocrine: Negative for hot flashes.  Genitourinary:  Negative for difficulty urinating.   Musculoskeletal:  Negative for arthralgias.  Skin:  Negative for itching and rash.  Neurological:  Negative for dizziness, extremity weakness, headaches and numbness.  Hematological:  Negative for adenopathy. Does not bruise/bleed easily.  Psychiatric/Behavioral:  Negative for depression. The patient is not nervous/anxious.       PHYSICAL EXAMINATION     Vitals:   05/17/24 0900  BP: 123/78  Pulse: 77  Resp: 16  Temp: 98.3 F (36.8 C)  SpO2: 100%    Physical Exam Constitutional:      General: She is not in acute distress.    Appearance: Normal appearance. She is not toxic-appearing.  HENT:     Head: Normocephalic and atraumatic.     Mouth/Throat:     Mouth: Mucous membranes are moist.     Pharynx:  Oropharynx is clear. No oropharyngeal exudate or posterior oropharyngeal erythema.  Eyes:     General: No scleral icterus. Cardiovascular:     Rate and Rhythm: Normal rate and regular rhythm.     Pulses: Normal pulses.     Heart sounds: Normal  heart sounds.  Pulmonary:     Effort: Pulmonary effort is normal.     Breath sounds: Normal breath sounds.  Abdominal:     General: Abdomen is flat. Bowel sounds are normal. There is no distension.     Palpations: Abdomen is soft.     Tenderness: There is no abdominal tenderness.  Musculoskeletal:        General: No swelling.     Cervical back: Neck supple.  Lymphadenopathy:     Cervical: No cervical adenopathy.  Skin:    General: Skin is warm and dry.     Findings: No rash.  Neurological:     General: No focal deficit present.     Mental Status: She is alert.  Psychiatric:        Mood and Affect: Mood normal.        Behavior: Behavior normal.     LABORATORY DATA:  CBC    Component Value Date/Time   WBC 13.7 (H) 05/17/2024 0801   WBC 11.4 (H) 03/28/2023 1051   RBC 4.07 05/17/2024 0801   HGB 10.4 (L) 05/17/2024 0801   HGB 13.1 08/12/2014 0955   HCT 32.8 (L) 05/17/2024 0801   HCT 40.4 08/12/2014 0955   PLT 403 (H) 05/17/2024 0801   PLT 228 08/12/2014 0955   MCV 80.6 05/17/2024 0801   MCV 98.2 08/12/2014 0955   MCH 25.6 (L) 05/17/2024 0801   MCHC 31.7 05/17/2024 0801   RDW 19.9 (H) 05/17/2024 0801   RDW 13.0 08/12/2014 0955   LYMPHSABS 3.1 05/17/2024 0801   LYMPHSABS 2.0 08/12/2014 0955   MONOABS 0.9 05/17/2024 0801   MONOABS 0.5 08/12/2014 0955   EOSABS 0.3 05/17/2024 0801   EOSABS 0.2 08/12/2014 0955   BASOSABS 0.1 05/17/2024 0801   BASOSABS 0.0 08/12/2014 0955    CMP     Component Value Date/Time   NA 137 04/02/2024 0834   NA 142 08/12/2014 0956   K 3.7 04/02/2024 0834   K 4.6 08/12/2014 0956   CL 104 04/02/2024 0834   CL 100 12/20/2012 1509   CO2 29 04/02/2024 0834   CO2 26 08/12/2014 0956   GLUCOSE 72 04/02/2024 0834   GLUCOSE 94 08/12/2014 0956   GLUCOSE 98 12/20/2012 1509   BUN 14 04/02/2024 0834   BUN 10.1 08/12/2014 0956   CREATININE 0.61 04/02/2024 0834   CREATININE 0.83 03/28/2023 1051   CREATININE 0.9 08/12/2014 0956   CALCIUM 9.3  04/02/2024 0834   CALCIUM 9.3 08/12/2014 0956   PROT 7.0 04/02/2024 0834   PROT 7.1 08/12/2014 0956   ALBUMIN 3.4 (L) 04/02/2024 0834   ALBUMIN 3.8 08/12/2014 0956   AST 11 (L) 04/02/2024 0834   AST 15 08/12/2014 0956   ALT 13 04/02/2024 0834   ALT 11 08/12/2014 0956   ALKPHOS 68 04/02/2024 0834   ALKPHOS 68 08/12/2014 0956   BILITOT 0.2 04/02/2024 0834   BILITOT 0.25 08/12/2014 0956   GFRNONAA >60 04/02/2024 0834   GFRNONAA 76 03/11/2019 1057   GFRAA >60 03/24/2020 1012   GFRAA >60 02/11/2020 0836   GFRAA 88 03/11/2019 1057     ASSESSMENT and THERAPY PLAN:   Breast cancer of upper-inner quadrant of left female breast  with Brain Mets ---s/p Lumpectomy/ /Initial Ca 1997, Brain Mets 2019 Left breast invasive ductal carcinoma ER/PR positive HER-2 positive initially 3.1 cm, Ki-67 70%, HER-2 amplified ratio 2.91 status post neoadjuvant chemotherapy followed by surgery which showed 1.8 cm tumor 1 positive sentinel lymph node T1cN1 M0 stage IB status post radiation therapy and Herceptin  maintenance and took tamoxifen  06/05/2013-08/11/2017   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  SRS brain: 08/25/2017-09/04/2017 2. Anti Her 2 therapy with Lapatinib  started 09/17/2017-05/14/2019: Stopped for progression 3.  I discontinued tamoxifen  and started her on letrozole  2.5 mg daily.  05/14/2019 stopped for progression 4.  Stereotactic radiosurgery 12/19/2017 to the new right parietal lobe metastases. 5.  Kadcyla : Received 16 cycles discontinued 05/05/2020 6.  Tucatinib  Xeloda : Discontinued because of hand-foot syndrome 06/11/2020-12/15/2020 7. Herceptin /Neratinib : unable to tolerate due to diarrhea and weight loss 8.  SRS to new brain mets 10/01/2021 9.  Resection of brain metastases at [facility] 12/29/2022: Metastatic breast cancer proc panel pending 10.  SRS to brain mets completed 02/20/2023 11. Avastin  with Herceptin  01/09/2024 through 04/02/2024 12.  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  Echo stable. Tolerating well.  - Scheduled echocardiogram for next month. - Restaging scans next month - Scheduled remaining treatments. - Coordinate treatment schedule with upcoming [place] trip.  Brain Metastases Managed by Dr. Buckley and Dr. Patrcia.  Most recent imaging reveals decrease in size of lesions, no new lesions, Avastin  discontinued.  - Continue F/u with Dr. Buckley.   - Next MRI brain scheduled 07/18/2024, f/u with Dr. Buckley on 07/22/2024  RTC in 3 weeks for labs, f/u, and next treatment.    All questions were answered. The patient knows to call the clinic with any problems, questions or concerns. We can certainly see the patient much sooner if necessary.  Total encounter time:20 minutes*in face-to-face visit time, chart review, lab review, care coordination, order entry, and documentation of the encounter time.    Morna Kendall, NP 05/21/24 11:44 AM Medical  Oncology and Hematology [facility] [address] [place], [place] [address] Tel. [phone]    Fax. [phone]  *Total Encounter Time as defined by the Centers for Medicare and Medicaid Services includes, in addition to the face-to-face time of a patient visit (documented in the note above) non-face-to-face time: obtaining and reviewing outside history, ordering and reviewing medications, tests or procedures, care coordination (communications with other health care professionals or caregivers) and documentation in the medical record.

## 2024-05-20 ENCOUNTER — Telehealth: Payer: Self-pay

## 2024-05-20 ENCOUNTER — Other Ambulatory Visit: Payer: Self-pay | Admitting: Hematology and Oncology

## 2024-05-20 ENCOUNTER — Encounter: Payer: Self-pay | Admitting: Hematology and Oncology

## 2024-05-20 MED ORDER — DIPHENHYDRAMINE HCL 50 MG PO TABS: ORAL_TABLET | ORAL | 0 refills | Status: AC

## 2024-05-20 MED ORDER — PREDNISONE 50 MG PO TABS: ORAL_TABLET | ORAL | 0 refills | Status: DC

## 2024-05-20 NOTE — Progress Notes (Signed)
 Pt prepped for 13 hour pre med. Order obtained to access port.

## 2024-05-21 ENCOUNTER — Encounter: Payer: Self-pay | Admitting: Hematology and Oncology

## 2024-05-21 NOTE — Assessment & Plan Note (Signed)
 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  SRS brain: 08/25/2017-09/04/2017 2. Anti Her 2 therapy with Lapatinib  started 09/17/2017-05/14/2019: Stopped for progression 3.  I discontinued tamoxifen  and started her on letrozole  2.5 mg daily.  05/14/2019 stopped for progression 4.  Stereotactic radiosurgery 12/19/2017 to the new right parietal lobe metastases. 5.  Kadcyla : Received 16 cycles discontinued 05/05/2020 6.  Tucatinib  Xeloda : Discontinued because of hand-foot syndrome 06/11/2020-12/15/2020 7. Herceptin /Neratinib : unable to tolerate due to diarrhea and weight loss 8.  SRS to new brain mets 10/01/2021 9.  Resection of brain metastases at [facility] 12/29/2022: Metastatic breast cancer proc panel pending 10.  SRS to brain mets completed 02/20/2023 11. Avastin  with Herceptin  01/09/2024 through 04/02/2024 12. 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

## 2024-05-22 ENCOUNTER — Ambulatory Visit
Admission: RE | Admit: 2024-05-22 | Discharge: 2024-05-22 | Disposition: A | Discharge status: Home / Self Care | Source: Ambulatory Visit | Attending: Hematology and Oncology | Admitting: Hematology and Oncology

## 2024-05-22 DIAGNOSIS — C50212 Malignant neoplasm of upper-inner quadrant of left female breast: Secondary | ICD-10-CM

## 2024-05-22 DIAGNOSIS — C50919 Malignant neoplasm of unspecified site of unspecified female breast: Secondary | ICD-10-CM | POA: Diagnosis not present

## 2024-05-22 MED ORDER — IOPAMIDOL (ISOVUE-370) INJECTION 76%
80.0000 mL | Freq: Once | INTRAVENOUS | Status: AC | PRN
  Administered 2024-05-22: 80 mL via INTRAVENOUS

## 2024-05-22 MED ORDER — HEPARIN SOD (PORK) LOCK FLUSH 100 UNIT/ML IV SOLN
500.0000 [IU] | Freq: Once | INTRAVENOUS | Status: AC
  Administered 2024-05-22: 500 [IU] via INTRAVENOUS

## 2024-05-27 ENCOUNTER — Other Ambulatory Visit: Payer: Self-pay | Admitting: Hematology and Oncology

## 2024-05-27 ENCOUNTER — Telehealth: Payer: Self-pay

## 2024-05-27 MED ORDER — HYDROCOD POLI-CHLORPHE POLI ER 10-8 MG/5ML PO SUER: 5.0000 mL | Freq: Two times a day (BID) | ORAL | 0 refills | Status: DC

## 2024-05-27 NOTE — Telephone Encounter (Signed)
 Pt called to request refill on Tussionex. Advised pt I made MD aware of refill request. No other questions/concerns at this time.

## 2024-05-29 ENCOUNTER — Telehealth: Payer: Self-pay | Admitting: *Deleted

## 2024-05-29 DIAGNOSIS — C7931 Secondary malignant neoplasm of brain: Secondary | ICD-10-CM

## 2024-05-29 MED ORDER — LEVETIRACETAM 250 MG PO TABS: 250.0000 mg | ORAL_TABLET | Freq: Two times a day (BID) | ORAL | 3 refills | Status: AC

## 2024-05-29 NOTE — Addendum Note (Signed)
 Addended by: Phi Avans K on: 05/29/2024 02:47 PM   Modules accepted: Orders

## 2024-05-29 NOTE — Telephone Encounter (Signed)
 Melissa [name] left a message requesting a refill on her Keppra  250. States when she checks pharmacy portal is says she can only get # 10. Evidently she had gotten a partial prescription when she was going out of town. Is requesting that Dr Buckley send in for # 30. She has ~ 3 days left on current prescription

## 2024-05-31 ENCOUNTER — Other Ambulatory Visit: Payer: Self-pay

## 2024-06-03 ENCOUNTER — Inpatient Hospital Stay

## 2024-06-03 ENCOUNTER — Inpatient Hospital Stay: Admitting: Hematology and Oncology

## 2024-06-03 VITALS — BP 109/65 | HR 85 | Temp 97.6°F | Resp 16 | Ht 62.0 in | Wt 116.2 lb

## 2024-06-03 DIAGNOSIS — Z171 Estrogen receptor negative status [ER-]: Secondary | ICD-10-CM | POA: Diagnosis not present

## 2024-06-03 DIAGNOSIS — C50212 Malignant neoplasm of upper-inner quadrant of left female breast: Secondary | ICD-10-CM

## 2024-06-03 DIAGNOSIS — Z170 Estrogen receptor positive status [ER+]: Secondary | ICD-10-CM | POA: Diagnosis not present

## 2024-06-03 DIAGNOSIS — C787 Secondary malignant neoplasm of liver and intrahepatic bile duct: Secondary | ICD-10-CM | POA: Diagnosis not present

## 2024-06-03 DIAGNOSIS — C7931 Secondary malignant neoplasm of brain: Secondary | ICD-10-CM

## 2024-06-03 DIAGNOSIS — C50912 Malignant neoplasm of unspecified site of left female breast: Secondary | ICD-10-CM

## 2024-06-03 DIAGNOSIS — Z5112 Encounter for antineoplastic immunotherapy: Secondary | ICD-10-CM | POA: Diagnosis not present

## 2024-06-03 DIAGNOSIS — Z1721 Progesterone receptor positive status: Secondary | ICD-10-CM | POA: Diagnosis not present

## 2024-06-03 DIAGNOSIS — C7951 Secondary malignant neoplasm of bone: Secondary | ICD-10-CM | POA: Diagnosis not present

## 2024-06-03 LAB — CBC WITH DIFFERENTIAL (CANCER CENTER ONLY)
Abs Immature Granulocytes: 0.08 K/uL — ABNORMAL HIGH (ref 0.00–0.07)
Basophils Absolute: 0.1 K/uL (ref 0.0–0.1)
Basophils Relative: 1 %
Eosinophils Absolute: 0.3 K/uL (ref 0.0–0.5)
Eosinophils Relative: 2 %
HCT: 33.2 % — ABNORMAL LOW (ref 36.0–46.0)
Hemoglobin: 10.6 g/dL — ABNORMAL LOW (ref 12.0–15.0)
Immature Granulocytes: 1 %
Lymphocytes Relative: 21 %
Lymphs Abs: 3.1 K/uL (ref 0.7–4.0)
MCH: 25.9 pg — ABNORMAL LOW (ref 26.0–34.0)
MCHC: 31.9 g/dL (ref 30.0–36.0)
MCV: 81.2 fL (ref 80.0–100.0)
Monocytes Absolute: 1.3 K/uL — ABNORMAL HIGH (ref 0.1–1.0)
Monocytes Relative: 9 %
Neutro Abs: 10 K/uL — ABNORMAL HIGH (ref 1.7–7.7)
Neutrophils Relative %: 66 %
Platelet Count: 385 K/uL (ref 150–400)
RBC: 4.09 MIL/uL (ref 3.87–5.11)
RDW: 20.5 % — ABNORMAL HIGH (ref 11.5–15.5)
WBC Count: 14.8 K/uL — ABNORMAL HIGH (ref 4.0–10.5)
nRBC: 0 % (ref 0.0–0.2)

## 2024-06-03 MED ORDER — SODIUM CHLORIDE 0.9 % IV SOLN: Freq: Once | INTRAVENOUS | Status: AC

## 2024-06-03 MED ORDER — DIPHENHYDRAMINE HCL 25 MG PO CAPS
25.0000 mg | ORAL_CAPSULE | Freq: Once | ORAL | Status: AC
  Administered 2024-06-03: 25 mg via ORAL
  Filled 2024-06-03: qty 1

## 2024-06-03 MED ORDER — ACETAMINOPHEN 325 MG PO TABS
650.0000 mg | ORAL_TABLET | Freq: Once | ORAL | Status: AC
  Administered 2024-06-03: 650 mg via ORAL
  Filled 2024-06-03: qty 2

## 2024-06-03 MED ORDER — TRASTUZUMAB-ANNS CHEMO 150 MG IV SOLR
6.0000 mg/kg | Freq: Once | INTRAVENOUS | Status: AC
  Administered 2024-06-03: 300 mg via INTRAVENOUS
  Filled 2024-06-03: qty 14.29

## 2024-06-03 NOTE — Patient Instructions (Signed)
 [facility] - A DEPT OF [facility]  Discharge Instructions: Thank you for choosing [facility] to provide your oncology and hematology care.   If you have a lab appointment with the Cancer Center, please go directly to the Cancer Center and check in at the registration area.   Wear comfortable clothing and clothing appropriate for easy access to any Portacath or PICC line.   We strive to give you quality time with your provider. You may need to reschedule your appointment if you arrive late (15 or more minutes).  Arriving late affects you and other patients whose appointments are after yours.  Also, if you miss three or more appointments without notifying the office, you may be dismissed from the clinic at the provider's discretion.      For prescription refill requests, have your pharmacy contact our office and allow 72 hours for refills to be completed.    Today you received the following chemotherapy and/or immunotherapy agents: Trastuzumab -anns (Kanjinti )    To help prevent nausea and vomiting after your treatment, we encourage you to take your nausea medication as directed.  BELOW ARE SYMPTOMS THAT SHOULD BE REPORTED IMMEDIATELY: *FEVER GREATER THAN 100.4 F (38 C) OR HIGHER *CHILLS OR SWEATING *NAUSEA AND VOMITING THAT IS NOT CONTROLLED WITH YOUR NAUSEA MEDICATION *UNUSUAL SHORTNESS OF BREATH *UNUSUAL BRUISING OR BLEEDING *URINARY PROBLEMS (pain or burning when urinating, or frequent urination) *BOWEL PROBLEMS (unusual diarrhea, constipation, pain near the anus) TENDERNESS IN MOUTH AND THROAT WITH OR WITHOUT PRESENCE OF ULCERS (sore throat, sores in mouth, or a toothache) UNUSUAL RASH, SWELLING OR PAIN  UNUSUAL VAGINAL DISCHARGE OR ITCHING   Items with * indicate a potential emergency and should be followed up as soon as possible or go to the Emergency Department if any problems should occur.  Please show the CHEMOTHERAPY ALERT CARD  or IMMUNOTHERAPY ALERT CARD at check-in to the Emergency Department and triage nurse.  Should you have questions after your visit or need to cancel or reschedule your appointment, please contact [facility] - A DEPT OF [facility]  Dept: [phone]  and follow the prompts.  Office hours are 8:00 a.m. to 4:30 p.m. Monday - Friday. Please note that voicemails left after 4:00 p.m. may not be returned until the following business day.  We are closed weekends and major holidays. You have access to a nurse at all times for urgent questions. Please call the main number to the clinic Dept: [phone] and follow the prompts.   For any non-urgent questions, you may also contact your provider using MyChart. We now offer e-Visits for anyone 57 and older to request care online for non-urgent symptoms. For details visit [email or web address].   Also download the MyChart app! Go to the app store, search MyChart, open the app, select [facility], and log in with your MyChart username and password.

## 2024-06-03 NOTE — Assessment & Plan Note (Signed)
 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  SRS brain: 08/25/2017-09/04/2017 2. Anti Her 2 therapy with Lapatinib  started 09/17/2017-05/14/2019: Stopped for progression 3.  I discontinued tamoxifen  and started her on letrozole  2.5 mg daily.  05/14/2019 stopped for progression 4.  Stereotactic radiosurgery 12/19/2017 to the new right parietal lobe metastases. 5.  Kadcyla : Received 16 cycles discontinued 05/05/2020 6.  Tucatinib  Xeloda : Discontinued because of hand-foot syndrome 06/11/2020-12/15/2020 7. Herceptin /Neratinib : unable to tolerate due to diarrhea and weight loss 8.  SRS to new brain mets 10/01/2021 9.  Resection [facility] metastases [facility] 12/29/2022: Metastatic breast cancer proc panel pending 10.  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  Follows with pulmonary   Brain metastases: External beam radiation given.  Dr. Buckley and CNS tumor board recommended concurrent treatment with Avastin  7.5 mg/kg IV every 3 weeks (for management of recurrent brain metastases and radiation necrosis)  Return to clinic every 3 weeks for Herceptin  and every 6 weeks for follow-up with us  Ordered CT chest abdomen pelvis to be done in December 2025

## 2024-06-03 NOTE — Progress Notes (Signed)
 Patient Care Team: [name], MD as PCP - General (Internal Medicine) Van Knee, MD as Referring Physician (Emergency Medicine)  DIAGNOSIS:  Encounter Diagnosis  Name Primary?   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 Ki-67 was 70% HER-2 was amplified with a ratio 2.91   07/12/2012 - 07/17/2013 Neo-Adjuvant Chemotherapy   TCH 6 followed by Herceptin  maintenance   08/14/2012 Genetic Testing   Negative genetic testing on the [facility].  The report date is August 14, 2012.  The [facility] gene panel offered by [facility] includes sequencing and deletion/duplication testing of the following 25 genes: APC, ATM, , BARD1, BMPR1A, BRCA1, BRCA2, BRIP1, CHD1, CDK4, CDKN2A, CHEK2, EPCAM (large rearrangement only), MLH1, MSH2, MSH6, MUTYH, NBN, PALB2, PMS2, PTEN, RAD51C, RAD51D, SMAD4, STK11, and TP53.     12/11/2012 Surgery   Left breast lumpectomy: 1.8 cm tumor 1 positive sentinel node, axillary lymph node dissection 02/08/2013 showed 0/13 lymph nodes   03/25/2013 - 05/06/2013 Radiation Therapy   Adjuvant radiation therapy   06/05/2013 - 07/20/2017 Anti-estrogen oral therapy   Tamoxifen  20 mg daily   07/27/2017 Relapse/Recurrence   MRI Brain: 3.4 x 2.9 x 2.9 cm RIGHT frontal lobe mass with imaging characteristics of solitary metastasis. Extensive vasogenic edema resulting in 9 mm RIGHT to LEFT midline shift. Equivocal very early LEFT ventricle entrapment.    08/04/2017 Surgery   Rt frontal brain resection: Poorly differentiated tumor IHC suggests breast primary ER and PR  Positive   08/25/2017 - 09/04/2017 Radiation Therapy   Stereotactic radiation   09/18/2017 -  Anti-estrogen oral therapy   Lapatinib  with letrozole    12/18/2017 - 12/19/2017 Radiation Therapy   New right parietal lobe metastases status post [facility]   08/27/2018 - 08/27/2020 Radiation Therapy   SRS to new brain metastases   05/09/2019 Relapse/Recurrence   Interval increase in size of the enhancing nodular left internal mammary soft tissue 2.8 cm.  Redemonstrated enlarged supraclavicular, lower cervical and lower posterior cervical nodes unchanged.  Interval increase in the bony erosion of the posterior and lateral left third rib, increasing soft tissue lesion eroding the left sternal body 3.1 cm was 2.5 cm.  Bronchiectatic changes   06/19/2019 - 05/05/2020 Chemotherapy   ado-trastuzumab emtansine  (KADCYLA )     06/15/2020 -  Chemotherapy   Xeloda , Tucatinib , Herceptin     04/12/2022 - 09/06/2022 Chemotherapy   Patient is on Treatment Plan : BREAST Trastuzumab  IV (8/6) or SQ (600) D1 q21d     09/27/2022 -  Chemotherapy   Patient is on Treatment Plan : BREAST MAINTENANCE Trastuzumab  IV every 3 weeks (21 days)     11/08/2022 Miscellaneous   Adding low-dose neratinib  (80 mg) to Herceptin    12/20/2022 Cancer Staging   Staging form: Breast, AJCC 7th Edition - Pathologic: Stage IV (M1) - Signed by Crawford Morna Pickle, NP on 12/20/2022   Cancer of left breast metastatic to brain /Initial Ca 1997, Brain Mets 2019  06/10/2019 Initial Diagnosis   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)   06/19/2019 - 05/05/2020 Chemotherapy   ado-trastuzumab emtansine  (KADCYLA )     09/27/2022 -  Chemotherapy  Patient is on Treatment Plan : BREAST MAINTENANCE Trastuzumab  IV every 3 weeks (21 days)     Port-A-Cath in place    CHIEF COMPLIANT:   HISTORY OF PRESENT ILLNESS: Discussed the use of AI scribe software for clinical note transcription with the patient, who gave verbal consent to  proceed.  History of Present Illness Melissa [name] is a 58 year old female with extensive bronchiectasis and possible aspergillus infection who presents for follow-up of her lung condition.  A recent CT scan on November 14th shows an increase in size of an air fluid level in the inferior cavitary component of her lung, now measuring 9.7 cm, associated with extensive bronchiectasis and possible aspergillus infection.  She has been on medication for over a year for aspergillus, but the diagnosis remains undetermined as certain expected cultures did not grow. Her infectious disease physician discontinued the medication due to this uncertainty.  She experiences persistent coughing, which affects her sleep, and uses Tasonex at night to help manage this symptom.     ALLERGIES:  is allergic to aspirin, protonix  [pantoprazole ], doxycycline , promethazine -codeine , sulfamethoxazole-trimethoprim , and iodinated contrast media.  MEDICATIONS:  Current Outpatient Medications  Medication Sig Dispense Refill   acetaminophen  (TYLENOL ) 325 MG tablet Take 2 tablets (650 mg total) by mouth every 6 (six) hours as needed for mild pain (or Fever >/= 101). 12 tablet 0   albuterol  (VENTOLIN  HFA) 108 (90 Base) MCG/ACT inhaler Inhale 2 puffs into the lungs every 6 (six) hours as needed for wheezing or shortness of breath (cough). 18 g 5   calcium carbonate (TUMS EX) 750 MG chewable tablet Chew by mouth.     chlorpheniramine -HYDROcodone  (TUSSIONEX) 10-8 MG/5ML Take 5 mLs by mouth 2 (two) times daily. 300 mL 0   dexamethasone  (DECADRON ) 2 MG tablet Take 1 tablet (2 mg total) by mouth daily. 90 tablet 0   diphenhydrAMINE  (BENADRYL ) 50 MG tablet Take 50 mg of Benadryl  on 05/22/24 at 1:15 pm with the last dose of Prednisone  50 mg . Please call [phone] with any questions. 1 tablet 0   escitalopram (LEXAPRO) 10 MG tablet Take 10 mg by mouth daily.     levalbuterol  (XOPENEX ) 0.63 MG/3ML nebulizer solution Take 3 mLs (0.63  mg total) by nebulization every 4 (four) hours as needed for wheezing or shortness of breath. 90 mL 12   levETIRAcetam  (KEPPRA ) 250 MG tablet Take 1 tablet (250 mg total) by mouth 2 (two) times daily. 60 tablet 3   lidocaine -prilocaine  (EMLA ) cream APPLY EXTERNALLY TO THE AFFECTED AREA 1 TIME 30 g 3   Multiple Vitamins-Minerals (EMERGEN-C VITAMIN C PO) Take by mouth.     naproxen sodium (ALEVE) 220 MG tablet Take by mouth.     ondansetron  (ZOFRAN -ODT) 4 MG disintegrating tablet Take 4 mg by mouth every 8 (eight) hours as needed.     SYMBICORT  160-4.5 MCG/ACT inhaler Inhale 2 puffs into the lungs 2 (two) times daily.     predniSONE  (DELTASONE ) 50 MG tablet Pt to take 50 mg of prednisone  on 05/22/24 at 1:15 am, then 50 mg of prednisone  on 05/22/24 at 7:15 am, and then 50 mg of prednisone  on 05/22/24 at 1:15 pm. Pt is also to take 50 mg of benadryl  on 05/22/24 at 1:15 pm. Please call [phone] with any questions. (Patient not taking: Reported on 06/03/2024) 3 tablet 0   No current facility-administered medications for this visit.   Facility-Administered Medications Ordered in Other Visits  Medication Dose Route Frequency Provider Last Rate Last  Admin   alteplase  (CATHFLO ACTIVASE ) injection 2 mg  2 mg Intracatheter Once PRN Odean Potts, MD       sodium chloride  flush (NS) 0.9 % injection 10 mL  10 mL Intracatheter PRN Odean Potts, MD   10 mL at 07/06/21 1428    PHYSICAL EXAMINATION: ECOG PERFORMANCE STATUS: 1 - Symptomatic but completely ambulatory  Vitals:   06/03/24 0822  BP: 109/65  Pulse: 85  Resp: 16  Temp: 97.6 F (36.4 C)  SpO2: 99%   Filed Weights   06/03/24 0822  Weight: 116 lb 3.2 oz (52.7 kg)    Physical Exam   (exam performed in the presence of a chaperone)  LABORATORY DATA:  I have reviewed the data as listed    Latest Ref Rng & Units 04/02/2024    8:34 AM 03/12/2024    8:48 AM 01/30/2024    1:11 PM  CMP  Glucose 70 - 99 mg/dL 72  62  81   BUN 6 - 20 mg/dL  14  6  7    Creatinine 0.44 - 1.00 mg/dL 9.38  9.44  9.35   Sodium 135 - 145 mmol/L 137  136  136   Potassium 3.5 - 5.1 mmol/L 3.7  4.1  4.3   Chloride 98 - 111 mmol/L 104  102  101   CO2 22 - 32 mmol/L 29  28  30    Calcium 8.9 - 10.3 mg/dL 9.3  9.5  9.3   Total Protein 6.5 - 8.1 g/dL 7.0  7.5  7.4   Total Bilirubin 0.0 - 1.2 mg/dL 0.2  0.2  0.2   Alkaline Phos 38 - 126 U/L 68  82  93   AST 15 - 41 U/L 11  9  10    ALT 0 - 44 U/L 13  7  7      Lab Results  Component Value Date   WBC 14.8 (H) 06/03/2024   HGB 10.6 (L) 06/03/2024   HCT 33.2 (L) 06/03/2024   MCV 81.2 06/03/2024   PLT 385 06/03/2024   NEUTROABS 10.0 (H) 06/03/2024    ASSESSMENT & PLAN:  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  SRS brain: 08/25/2017-09/04/2017 2. Anti Her 2 therapy with Lapatinib  started 09/17/2017-05/14/2019: Stopped for progression 3.  I discontinued tamoxifen  and started her on letrozole  2.5 mg daily.  05/14/2019 stopped for progression 4.  Stereotactic radiosurgery 12/19/2017 to the new right parietal lobe metastases. 5.  Kadcyla : Received 16 cycles discontinued 05/05/2020 6.  Tucatinib  Xeloda : Discontinued because of hand-foot syndrome 06/11/2020-12/15/2020 7. Herceptin /Neratinib : unable to tolerate due to diarrhea and weight loss 8.  SRS to new brain mets 10/01/2021 9.  Resection of brain metastases at [facility] 12/29/2022: Metastatic breast cancer proc panel pending 10.  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  Follows with pulmonary I sent a message to Dr. Annella to review her CT scans and see if there is anything that they can do for her chronic cough as well as the worsening bronchiectasis.   Brain metastases: External beam radiation given.  Dr. Buckley and CNS tumor board recommended concurrent treatment with Avastin  7.5 mg/kg IV every 3 weeks (for management of recurrent brain metastases and radiation necrosis)  Return to clinic every 3 weeks for Herceptin  and every 6 weeks for follow-up with us    Assessment & Plan Bronchiectasis with large cavitary lesions and chronic cavitary lung disease, left lung Extensive bronchiectasis with increased cavitary lesion size from 7.5 cm to 9.7 cm. High risk of rupture or leakage. Previous pulmonology consultation inconclusive. - Contacted pulmonologist Dr. Donnice Richters for further evaluation and recommendations.  Malignant neoplasm of upper-inner quadrant of left breast No new discussion on breast cancer. Avastin  treatment previously stopped without  issues. - CT scan scheduled for December. - Echocardiogram scheduled for next month.  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 Lab results inconclusive for aspergillus. Condition remains undetermined. - Monitor with upcoming imaging studies.      No orders of the defined types were placed in this encounter.  The patient has a good understanding of the overall plan. she agrees with it. she will call with any problems that may develop before the next visit here.  I personally spent a total of 30 minutes in the care of the patient today including preparing to see the patient, getting/reviewing separately obtained history, performing a medically appropriate exam/evaluation, counseling and educating, placing orders, referring and communicating with other health care professionals, documenting clinical information in the EHR, independently interpreting results, communicating results, and coordinating care.   Viinay K Lenn Volker, MD 06/03/24

## 2024-06-04 ENCOUNTER — Inpatient Hospital Stay

## 2024-06-04 ENCOUNTER — Inpatient Hospital Stay: Admitting: Hematology and Oncology

## 2024-06-09 DIAGNOSIS — E7800 Pure hypercholesterolemia, unspecified: Secondary | ICD-10-CM | POA: Diagnosis not present

## 2024-06-09 DIAGNOSIS — C50919 Malignant neoplasm of unspecified site of unspecified female breast: Secondary | ICD-10-CM | POA: Diagnosis not present

## 2024-06-10 ENCOUNTER — Ambulatory Visit: Admitting: Pulmonary Disease

## 2024-06-10 ENCOUNTER — Encounter: Payer: Self-pay | Admitting: Pulmonary Disease

## 2024-06-10 ENCOUNTER — Ambulatory Visit: Payer: Self-pay | Admitting: Pulmonary Disease

## 2024-06-10 ENCOUNTER — Telehealth: Payer: Self-pay | Admitting: Pulmonary Disease

## 2024-06-10 VITALS — BP 92/50 | HR 96 | Temp 98.2°F | Ht 62.0 in | Wt 117.0 lb

## 2024-06-10 DIAGNOSIS — Z902 Acquired absence of lung [part of]: Secondary | ICD-10-CM

## 2024-06-10 DIAGNOSIS — J984 Other disorders of lung: Secondary | ICD-10-CM

## 2024-06-10 DIAGNOSIS — R058 Other specified cough: Secondary | ICD-10-CM

## 2024-06-10 DIAGNOSIS — J439 Emphysema, unspecified: Secondary | ICD-10-CM | POA: Diagnosis not present

## 2024-06-10 DIAGNOSIS — J479 Bronchiectasis, uncomplicated: Secondary | ICD-10-CM

## 2024-06-10 NOTE — Progress Notes (Signed)
 @Patient  ID: Melissa [name], female    DOB: [birth year], 58 y.o.   MRN: [id number]  Chief Complaint  Patient presents with   Acute Visit    Acute for cough and new pain    Referring provider: [name], MD  HPI:   58 y.o. woman with history of breast cancer with intracranial metastasis status post recent radiation and on a Avastin  therapy as well as background tamoxifen  with history of fungal infections status post left upper lobe lobectomy, concern for more recent aspergillosis on and off azole therapy currently off for some time with stable imaging findings, chronic left-sided bronchiectasis here for acute visit with left-sided chest pain and worsening cough.  Most recent hematology/oncology note reviewed.  Unfortunate, several weeks of increased cough.  Developed left-sided chest pain.  Pleuritic in nature.  CT chest, surveillance for cancer, 05/2024 with enlargement of chronic cavitary versus severely dilated bronchial/bronchiectasis with air-fluid level.  Discussed getting more information, bronchoscopy with BAL to ascertain fungal versus bacterial and help decide treatment moving forward.  HPI at initial visit: Patient last in clinic in 2022.  Lost to follow-up.  Has had serial CT scans given her history of breast cancer.  For several years has demonstrated significant bronchiectatic changes, cavitary lesion that looks like a very large dilated bronchus on my review interpretation, relatively severe bronchiectasis on the left, and scattered emphysematous changes with waxing and waning air-fluid levels in the cavities.  Recently it was discovered she had intracranial metastasis.  She is undergone radiation therapy.  She started on a Avastin  about 9 weeks ago, has received 3 infusions.  She states cough worsened shortly after initiation of a Avastin .  Over the last few weeks she has had hemoptysis.  In the past she has had hemoptysis come and go.  But is pretty persistent over the last  several weeks.  This prompted visit to the ED in [place] via [facility] system 04/09/2023 CT scan at that date is interpreted as infiltrate near prior cavitary lesion concerning for progression of aspergillosis.  I cannot review these images.  She endorses weight loss.  Some chills.  Recent voriconazole  level seems low but I am not quite sure how best to interpret it.    Questionaires / Pulmonary Flowsheets:   ACT:      No data to display          MMRC: mMRC Dyspnea Scale mMRC Score  03/05/2020  3:06 PM 1    Epworth:      No data to display          Tests:   FENO:  No results found for: NITRICOXIDE  PFT:    Latest Ref Rng & Units 08/09/2016    9:43 AM  PFT Results  FVC-Pre L 2.79   FVC-Predicted Pre % 83   FVC-Post L 2.92   FVC-Predicted Post % 87   Pre FEV1/FVC % % 69   Post FEV1/FCV % % 70   FEV1-Pre L 1.94   FEV1-Predicted Pre % 73   FEV1-Post L 2.05   DLCO uncorrected ml/min/mmHg 10.08   DLCO UNC% % 46   DLVA Predicted % 55   TLC L 4.75   TLC % Predicted % 100   RV % Predicted % 124   Personally reviewed interpreted as mild COPD, no significant bronchodilator response, lung volumes consistent with air trapping, DLCO severely reduced  WALK:     05/02/2018    3:51 PM  SIX MIN WALK  Supplimental Oxygen during Test? (L/min) No  Tech Comments: Fast paced with some SOB noted.    Imaging: Personally reviewed and as per EMR discussion this note CT CHEST ABDOMEN PELVIS W CONTRAST Result Date: 05/24/2024 EXAM: CT CHEST, ABDOMEN AND PELVIS WITH CONTRAST 01/05/2024 03:05:39 PM TECHNIQUE: CT of the chest, abdomen and pelvis was performed with the administration of 80 cc isovue  370 intravenous contrast. Multiplanar reformatted images are provided for review. Automated exposure control, iterative reconstruction, and/or weight based adjustment of the mA/kV was utilized to reduce the radiation dose to as low as reasonably achievable. COMPARISON: None available. CLINICAL  HISTORY: Metastatic breast cancer restaging. * Tracking Code: BO * FINDINGS: CHEST: MEDIASTINUM AND LYMPH NODES: Right port-a-cath tip. Heart and pericardium are unremarkable. The central airways are clear. No mediastinal, hilar or axillary lymphadenopathy. LUNGS AND PLEURA: Emphysema. Left upper lobectomy. Extensive varicoid bronchiectasis in the left lower lobe. Air fluid level persists in the inferior cavitary component which has increased in size, currently 9.7 x 4.2 cm and previously 7.5 x 2.5 cm. Mild airway plugging anteriorly in the right lower lobe, and in the right middle. Stable trace left pleural effusion. No pneumothorax. ABDOMEN AND PELVIS: LIVER: The liver is unremarkable. GALLBLADDER AND BILE DUCTS: Mildly contracted gallbladder. Common bile duct 0.6 cm in diameter, upper normal size, but similar to prior. Periampullary duodenal diverticulum. SPLEEN: No acute abnormality. PANCREAS: No acute abnormality. ADRENAL GLANDS: No acute abnormality. KIDNEYS, URETERS AND BLADDER: No stones in the kidneys or ureters. No hydronephrosis. No perinephric or periureteral stranding. Urinary bladder is unremarkable. GI AND BOWEL: Stomach demonstrates no acute abnormality. Cluster diverticula proximally in the sigmoid colon. There is no bowel obstruction. REPRODUCTIVE ORGANS: No acute abnormality. PERITONEUM AND RETROPERITONEUM: No ascites. No free air. VASCULATURE: Aorta is normal in caliber. Systemic atherosclerosis is present, including the aorta and iliac arteries. ABDOMINAL AND PELVIS LYMPH NODES: No lymphadenopathy. BONES AND SOFT TISSUES: No acute osseous abnormality. Postoperative findings in the left breast and axilla without a well defined recurrent mass. No focal soft tissue abnormality. IMPRESSION: 1. No evidence of active malignancy metastatic disease. 2. Extensive varicoid bronchiectasis in the left lower lobe with enlarging cavitary component and persistent air-fluid level, now 9.7 x 4.2 cm from 7.5 x  2.5 cm. 3. Other findings include emphysema, atherosclerosis, and mild proximal sigmoid colon diverticulosis. Electronically signed by: Ryan Salvage MD 05/24/2024 11:18 AM EST RP Workstation: [id number]    Lab Results: Personally reviewed CBC    Component Value Date/Time   WBC 14.8 (H) 06/03/2024 0807   WBC 11.4 (H) 03/28/2023 1051   RBC 4.09 06/03/2024 0807   HGB 10.6 (L) 06/03/2024 0807   HGB 13.1 08/12/2014 0955   HCT 33.2 (L) 06/03/2024 0807   HCT 40.4 08/12/2014 0955   PLT 385 06/03/2024 0807   PLT 228 08/12/2014 0955   MCV 81.2 06/03/2024 0807   MCV 98.2 08/12/2014 0955   MCH 25.9 (L) 06/03/2024 0807   MCHC 31.9 06/03/2024 0807   RDW 20.5 (H) 06/03/2024 0807   RDW 13.0 08/12/2014 0955   LYMPHSABS 3.1 06/03/2024 0807   LYMPHSABS 2.0 08/12/2014 0955   MONOABS 1.3 (H) 06/03/2024 0807   MONOABS 0.5 08/12/2014 0955   EOSABS 0.3 06/03/2024 0807   EOSABS 0.2 08/12/2014 0955   BASOSABS 0.1 06/03/2024 0807   BASOSABS 0.0 08/12/2014 0955    BMET    Component Value Date/Time   NA 137 04/02/2024 0834   NA 142 08/12/2014 0956   K 3.7 04/02/2024 9165  K 4.6 08/12/2014 0956   CL 104 04/02/2024 0834   CL 100 12/20/2012 1509   CO2 29 04/02/2024 0834   CO2 26 08/12/2014 0956   GLUCOSE 72 04/02/2024 0834   GLUCOSE 94 08/12/2014 0956   GLUCOSE 98 12/20/2012 1509   BUN 14 04/02/2024 0834   BUN 10.1 08/12/2014 0956   CREATININE 0.61 04/02/2024 0834   CREATININE 0.83 03/28/2023 1051   CREATININE 0.9 08/12/2014 0956   CALCIUM 9.3 04/02/2024 0834   CALCIUM 9.3 08/12/2014 0956   GFRNONAA >60 04/02/2024 0834   GFRNONAA 76 03/11/2019 1057   GFRAA >60 03/24/2020 1012   GFRAA >60 02/11/2020 0836   GFRAA 88 03/11/2019 1057    BNP No results found for: BNP  ProBNP No results found for: PROBNP  Specialty Problems       Pulmonary Problems   Obstructive bronchiectasis (HCC)   Status post left upper lobectomy for ? [place]  1997 CT chest   01/26/2011 Focal  bronchiectasis / scarring medially in the left lung apex with  surrounding inflammatory changes  - Pneumovax 08/12/11  And prevnar 08/09/2016  - PFT's  08/09/2016  FEV1 2.05 (77 % ) ratio 70  p 5 % improvement from saba p dulera  100 x2  prior to study with DLCO  46 % corrects to 55  % for alv volume   - alpha one screening 03/27/2018  MM 267  - FOB/BAL 05/23/18 Pos Aspergillus > ID following -  12/13/2018  After extensive coaching inhaler device,  effectiveness =    75% (short ti)        Upper airway cough syndrome   Max rx for gerd trial basis 08/09/2016 >>>         Emphysema lung (HCC)   Cavitary lesion of lung in area of previous cyts in apex of Sup Segment of LLL   New since CT 01/18/18  assoc with acute onset L axillary cp 03/28/18 in area of previous blebs s a/f level c/w infected bleb rx augmentin  x 10 days > improved at ov 04/11/2018 and 10 more days augmentin  given - Quant TB 03/27/2018 neg  - ID eval 04/09/18 fungal serologies sent > neg  - 04/11/2018 rec another 10 days augmentin  for ? Abscess forming LUL  - Following with ID ? Start fluconazole  if not improving. Req records be sent to MD who treated [place] in '97   - CT 04/26/18 > Overall, there has been interval worsening in lung disease on the Left  - FOB Dr Karlis 05/24/19 with BAL pos for aspergillus > see ID rx          DOE (dyspnea on exertion)   05/02/2018  Walked RA x 3 laps @ 185 ft each stopped due to  End of study, fast pace, no   desat - min sob         Chronic pulmonary aspergillosis (HCC)   Hemoptysis    Allergies  Allergen Reactions   Aspirin Anaphylaxis and Shortness Of Breath    THROAT CLOSES   Protonix  [Pantoprazole ] Nausea Only and Other (See Comments)    Also caused a film in the mouth and caused chest pressure   Doxycycline  Other (See Comments)    Hallucinations, headaches   Promethazine -Codeine  Cough    Worsening cough and insomnia    Sulfamethoxazole-Trimethoprim  Other (See Comments)    Iodinated Contrast Media Rash    Patient allergic to contrast used in radiation oncology for CT simulation  Immunization History  Administered Date(s) Administered   Influenza Split 05/03/2011   Influenza Whole 03/11/2012   Influenza, Seasonal, Injecte, Preservative Fre 05/08/2023   Influenza,inj,Quad PF,6+ Mos 07/29/2017, 04/08/2019, 04/20/2020, 05/24/2021   Influenza-Unspecified 04/10/2022   PNEUMOCOCCAL CONJUGATE-20 08/18/2021   Pneumococcal Conjugate-13 08/09/2016   Pneumococcal-Unspecified 08/12/2011    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[facility])    2019   Cancer, metastatic to liver (HCC)    2021   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)    Coccidioidomycosis 11/05/2023   Complication of anesthesia    bp dropped + desat    COPD (chronic obstructive pulmonary disease) (HCC)    Dyspnea    DOE   GERD (gastroesophageal reflux disease)    H/O coccidioidomycosis    was reason for lung lobectomy   Headache(784.0)    due to eye strain or not eating   Hemoptysis 05/08/2023   History of anemia    no current problem   History of asthma    as a child   History of breast cancer 2014   left   History of chemotherapy    finished 07/17/2013   History of hiatal hernia    AGE 22   Hx of radiation therapy 03/25/13-05/06/13   left breast 5000 cGy/25 sessions, left breast boost 1000 cGy/5 sessions   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   Personal history of chemotherapy    for liver cancer   Personal history of radiation therapy    Pneumonia    LAST FLARE UP 01/2018   Rash 02/22/2021   Runny nose 07/30/2013   clear drainage   Smoker 05/24/2021   Wears dentures    upper    Tobacco History: Social History   Tobacco Use  Smoking Status Every Day    Current packs/day: 0.30   Average packs/day: 0.3 packs/day for 38.0 years (11.4 ttl pk-yrs)   Types: Cigarettes  Smokeless Tobacco Never  Tobacco Comments   1 pack a day 06/10/2024 KRD   Ready to quit: Not Answered Counseling given: Not Answered Tobacco comments: 1 pack a day 06/10/2024 KRD   Continue to not smoke  Outpatient Encounter Medications as of 06/10/2024  Medication Sig   acetaminophen  (TYLENOL ) 325 MG tablet Take 2 tablets (650 mg total) by mouth every 6 (six) hours as needed for mild pain (or Fever >/= 101).   albuterol  (VENTOLIN  HFA) 108 (90 Base) MCG/ACT inhaler Inhale 2 puffs into the lungs every 6 (six) hours as needed for wheezing or shortness of breath (cough).   calcium carbonate (TUMS EX) 750 MG chewable tablet Chew by mouth.   chlorpheniramine -HYDROcodone  (TUSSIONEX) 10-8 MG/5ML Take 5 mLs by mouth 2 (two) times daily.   dexamethasone  (DECADRON ) 2 MG tablet Take 1 tablet (2 mg total) by mouth daily.   diphenhydrAMINE  (BENADRYL ) 50 MG tablet Take 50 mg of Benadryl  on 05/22/24 at 1:15 pm with the last dose of Prednisone  50 mg . Please call [phone] with any questions.   escitalopram (LEXAPRO) 10 MG tablet Take 10 mg by mouth daily.   levalbuterol  (XOPENEX ) 0.63 MG/3ML nebulizer solution Take 3 mLs (0.63 mg total) by nebulization every 4 (four) hours as needed for wheezing or  shortness of breath.   levETIRAcetam  (KEPPRA ) 250 MG tablet Take 1 tablet (250 mg total) by mouth 2 (two) times daily.   lidocaine -prilocaine  (EMLA ) cream APPLY EXTERNALLY TO THE AFFECTED AREA 1 TIME   Multiple Vitamins-Minerals (EMERGEN-C VITAMIN C PO) Take by mouth.   naproxen sodium (ALEVE) 220 MG tablet Take by mouth.   ondansetron  (ZOFRAN -ODT) 4 MG disintegrating tablet Take 4 mg by mouth every 8 (eight) hours as needed.   SYMBICORT  160-4.5 MCG/ACT inhaler Inhale 2 puffs into the lungs 2 (two) times daily.   [DISCONTINUED] predniSONE  (DELTASONE ) 50 MG tablet Pt to take 50 mg of prednisone  on  05/22/24 at 1:15 am, then 50 mg of prednisone  on 05/22/24 at 7:15 am, and then 50 mg of prednisone  on 05/22/24 at 1:15 pm. Pt is also to take 50 mg of benadryl  on 05/22/24 at 1:15 pm. Please call [phone] with any questions. (Patient not taking: Reported on 06/03/2024)   Facility-Administered Encounter Medications as of 06/10/2024  Medication   alteplase  (CATHFLO ACTIVASE ) injection 2 mg   sodium chloride  flush (NS) 0.9 % injection 10 mL     Review of Systems  Review of Systems  N/a Physical Exam  BP (!) 92/50   Pulse 96   Temp 98.2 F (36.8 C) (Oral)   Ht 5' 2 (1.575 m)   Wt 117 lb (53.1 kg)   LMP 07/25/2012 Comment: pregnancy waiver form signed 01-18-2018  SpO2 95%   BMI 21.40 kg/m   Wt Readings from Last 5 Encounters:  06/10/24 117 lb (53.1 kg)  06/03/24 116 lb 3.2 oz (52.7 kg)  05/17/24 112 lb 4.8 oz (50.9 kg)  04/22/24 108 lb 6.4 oz (49.2 kg)  04/18/24 106 lb 9.6 oz (48.4 kg)    BMI Readings from Last 5 Encounters:  06/10/24 21.40 kg/m  06/03/24 21.25 kg/m  05/17/24 20.54 kg/m  04/22/24 19.83 kg/m  04/18/24 19.50 kg/m     Physical Exam General: Sitting in chair, no acute distress Eyes: EOMI, no icterus Pulmonary: Normal work of breathing, on room air, bronchial or tubular breath sounds on the left, Cardiovascular warm, no edema  Assessment & Plan:   Hemoptysis: Resolved after stopping Avastin  therapy.  Possible aspergillosis: Historically was on chronic voriconazole , good therapeutic levels. Switched to posaconazole fall 2024.  Stopped in early 2025/late 2024 due to worsening cough.  Symptoms of cough stable off antifungal therapy prior CT scan 12/2023 without worsening signs of infection etc. unfortunately, increase size and left lower lobe cavity and air-fluid level as well as worsening cough and left-sided chest pain concerning for worsening disease.  Will arrange bronchoscopy with BAL.  Biopsy is very high risk for pneumothorax and I advised  against this.  Bronchiectasis: Suspect related to chronic infections.  Prior lobectomy due to fungal infection.  Encouraged ongoing airway clearance hypertonic saline and flutter valve.    No follow-ups on file.   Donnice JONELLE Beals, MD 06/10/2024

## 2024-06-10 NOTE — H&P (View-Only) (Signed)
 @Patient  ID: Melissa [name], female    DOB: [birth year], 58 y.o.   MRN: [id number]  Chief Complaint  Patient presents with   Acute Visit    Acute for cough and new pain    Referring provider: [name], MD  HPI:   58 y.o. woman with history of breast cancer with intracranial metastasis status post recent radiation and on a Avastin  therapy as well as background tamoxifen  with history of fungal infections status post left upper lobe lobectomy, concern for more recent aspergillosis on and off azole therapy currently off for some time with stable imaging findings, chronic left-sided bronchiectasis here for acute visit with left-sided chest pain and worsening cough.  Most recent hematology/oncology note reviewed.  Unfortunate, several weeks of increased cough.  Developed left-sided chest pain.  Pleuritic in nature.  CT chest, surveillance for cancer, 05/2024 with enlargement of chronic cavitary versus severely dilated bronchial/bronchiectasis with air-fluid level.  Discussed getting more information, bronchoscopy with BAL to ascertain fungal versus bacterial and help decide treatment moving forward.  HPI at initial visit: Patient last in clinic in 2022.  Lost to follow-up.  Has had serial CT scans given her history of breast cancer.  For several years has demonstrated significant bronchiectatic changes, cavitary lesion that looks like a very large dilated bronchus on my review interpretation, relatively severe bronchiectasis on the left, and scattered emphysematous changes with waxing and waning air-fluid levels in the cavities.  Recently it was discovered she had intracranial metastasis.  She is undergone radiation therapy.  She started on a Avastin  about 9 weeks ago, has received 3 infusions.  She states cough worsened shortly after initiation of a Avastin .  Over the last few weeks she has had hemoptysis.  In the past she has had hemoptysis come and go.  But is pretty persistent over the last  several weeks.  This prompted visit to the ED in [place] via [facility] system 04/09/2023 CT scan at that date is interpreted as infiltrate near prior cavitary lesion concerning for progression of aspergillosis.  I cannot review these images.  She endorses weight loss.  Some chills.  Recent voriconazole  level seems low but I am not quite sure how best to interpret it.    Questionaires / Pulmonary Flowsheets:   ACT:      No data to display          MMRC: mMRC Dyspnea Scale mMRC Score  03/05/2020  3:06 PM 1    Epworth:      No data to display          Tests:   FENO:  No results found for: NITRICOXIDE  PFT:    Latest Ref Rng & Units 08/09/2016    9:43 AM  PFT Results  FVC-Pre L 2.79   FVC-Predicted Pre % 83   FVC-Post L 2.92   FVC-Predicted Post % 87   Pre FEV1/FVC % % 69   Post FEV1/FCV % % 70   FEV1-Pre L 1.94   FEV1-Predicted Pre % 73   FEV1-Post L 2.05   DLCO uncorrected ml/min/mmHg 10.08   DLCO UNC% % 46   DLVA Predicted % 55   TLC L 4.75   TLC % Predicted % 100   RV % Predicted % 124   Personally reviewed interpreted as mild COPD, no significant bronchodilator response, lung volumes consistent with air trapping, DLCO severely reduced  WALK:     05/02/2018    3:51 PM  SIX MIN WALK  Supplimental Oxygen during Test? (L/min) No  Tech Comments: Fast paced with some SOB noted.    Imaging: Personally reviewed and as per EMR discussion this note CT CHEST ABDOMEN PELVIS W CONTRAST Result Date: 05/24/2024 EXAM: CT CHEST, ABDOMEN AND PELVIS WITH CONTRAST 01/05/2024 03:05:39 PM TECHNIQUE: CT of the chest, abdomen and pelvis was performed with the administration of 80 cc isovue  370 intravenous contrast. Multiplanar reformatted images are provided for review. Automated exposure control, iterative reconstruction, and/or weight based adjustment of the mA/kV was utilized to reduce the radiation dose to as low as reasonably achievable. COMPARISON: None available. CLINICAL  HISTORY: Metastatic breast cancer restaging. * Tracking Code: BO * FINDINGS: CHEST: MEDIASTINUM AND LYMPH NODES: Right port-a-cath tip. Heart and pericardium are unremarkable. The central airways are clear. No mediastinal, hilar or axillary lymphadenopathy. LUNGS AND PLEURA: Emphysema. Left upper lobectomy. Extensive varicoid bronchiectasis in the left lower lobe. Air fluid level persists in the inferior cavitary component which has increased in size, currently 9.7 x 4.2 cm and previously 7.5 x 2.5 cm. Mild airway plugging anteriorly in the right lower lobe, and in the right middle. Stable trace left pleural effusion. No pneumothorax. ABDOMEN AND PELVIS: LIVER: The liver is unremarkable. GALLBLADDER AND BILE DUCTS: Mildly contracted gallbladder. Common bile duct 0.6 cm in diameter, upper normal size, but similar to prior. Periampullary duodenal diverticulum. SPLEEN: No acute abnormality. PANCREAS: No acute abnormality. ADRENAL GLANDS: No acute abnormality. KIDNEYS, URETERS AND BLADDER: No stones in the kidneys or ureters. No hydronephrosis. No perinephric or periureteral stranding. Urinary bladder is unremarkable. GI AND BOWEL: Stomach demonstrates no acute abnormality. Cluster diverticula proximally in the sigmoid colon. There is no bowel obstruction. REPRODUCTIVE ORGANS: No acute abnormality. PERITONEUM AND RETROPERITONEUM: No ascites. No free air. VASCULATURE: Aorta is normal in caliber. Systemic atherosclerosis is present, including the aorta and iliac arteries. ABDOMINAL AND PELVIS LYMPH NODES: No lymphadenopathy. BONES AND SOFT TISSUES: No acute osseous abnormality. Postoperative findings in the left breast and axilla without a well defined recurrent mass. No focal soft tissue abnormality. IMPRESSION: 1. No evidence of active malignancy metastatic disease. 2. Extensive varicoid bronchiectasis in the left lower lobe with enlarging cavitary component and persistent air-fluid level, now 9.7 x 4.2 cm from 7.5 x  2.5 cm. 3. Other findings include emphysema, atherosclerosis, and mild proximal sigmoid colon diverticulosis. Electronically signed by: Ryan Salvage MD 05/24/2024 11:18 AM EST RP Workstation: [id number]    Lab Results: Personally reviewed CBC    Component Value Date/Time   WBC 14.8 (H) 06/03/2024 0807   WBC 11.4 (H) 03/28/2023 1051   RBC 4.09 06/03/2024 0807   HGB 10.6 (L) 06/03/2024 0807   HGB 13.1 08/12/2014 0955   HCT 33.2 (L) 06/03/2024 0807   HCT 40.4 08/12/2014 0955   PLT 385 06/03/2024 0807   PLT 228 08/12/2014 0955   MCV 81.2 06/03/2024 0807   MCV 98.2 08/12/2014 0955   MCH 25.9 (L) 06/03/2024 0807   MCHC 31.9 06/03/2024 0807   RDW 20.5 (H) 06/03/2024 0807   RDW 13.0 08/12/2014 0955   LYMPHSABS 3.1 06/03/2024 0807   LYMPHSABS 2.0 08/12/2014 0955   MONOABS 1.3 (H) 06/03/2024 0807   MONOABS 0.5 08/12/2014 0955   EOSABS 0.3 06/03/2024 0807   EOSABS 0.2 08/12/2014 0955   BASOSABS 0.1 06/03/2024 0807   BASOSABS 0.0 08/12/2014 0955    BMET    Component Value Date/Time   NA 137 04/02/2024 0834   NA 142 08/12/2014 0956   K 3.7 04/02/2024 9165  K 4.6 08/12/2014 0956   CL 104 04/02/2024 0834   CL 100 12/20/2012 1509   CO2 29 04/02/2024 0834   CO2 26 08/12/2014 0956   GLUCOSE 72 04/02/2024 0834   GLUCOSE 94 08/12/2014 0956   GLUCOSE 98 12/20/2012 1509   BUN 14 04/02/2024 0834   BUN 10.1 08/12/2014 0956   CREATININE 0.61 04/02/2024 0834   CREATININE 0.83 03/28/2023 1051   CREATININE 0.9 08/12/2014 0956   CALCIUM 9.3 04/02/2024 0834   CALCIUM 9.3 08/12/2014 0956   GFRNONAA >60 04/02/2024 0834   GFRNONAA 76 03/11/2019 1057   GFRAA >60 03/24/2020 1012   GFRAA >60 02/11/2020 0836   GFRAA 88 03/11/2019 1057    BNP No results found for: BNP  ProBNP No results found for: PROBNP  Specialty Problems       Pulmonary Problems   Obstructive bronchiectasis (HCC)   Status post left upper lobectomy for ? [place]  1997 CT chest   01/26/2011 Focal  bronchiectasis / scarring medially in the left lung apex with  surrounding inflammatory changes  - Pneumovax 08/12/11  And prevnar 08/09/2016  - PFT's  08/09/2016  FEV1 2.05 (77 % ) ratio 70  p 5 % improvement from saba p dulera  100 x2  prior to study with DLCO  46 % corrects to 55  % for alv volume   - alpha one screening 03/27/2018  MM 267  - FOB/BAL 05/23/18 Pos Aspergillus > ID following -  12/13/2018  After extensive coaching inhaler device,  effectiveness =    75% (short ti)        Upper airway cough syndrome   Max rx for gerd trial basis 08/09/2016 >>>         Emphysema lung (HCC)   Cavitary lesion of lung in area of previous cyts in apex of Sup Segment of LLL   New since CT 01/18/18  assoc with acute onset L axillary cp 03/28/18 in area of previous blebs s a/f level c/w infected bleb rx augmentin  x 10 days > improved at ov 04/11/2018 and 10 more days augmentin  given - Quant TB 03/27/2018 neg  - ID eval 04/09/18 fungal serologies sent > neg  - 04/11/2018 rec another 10 days augmentin  for ? Abscess forming LUL  - Following with ID ? Start fluconazole  if not improving. Req records be sent to MD who treated [place] fever in '97   - CT 04/26/18 > Overall, there has been interval worsening in lung disease on the Left  - FOB Dr Karlis 05/24/19 with BAL pos for aspergillus > see ID rx          DOE (dyspnea on exertion)   05/02/2018  Walked RA x 3 laps @ 185 ft each stopped due to  End of study, fast pace, no   desat - min sob         Chronic pulmonary aspergillosis (HCC)   Hemoptysis    Allergies  Allergen Reactions   Aspirin Anaphylaxis and Shortness Of Breath    THROAT CLOSES   Protonix  [Pantoprazole ] Nausea Only and Other (See Comments)    Also caused a film in the mouth and caused chest pressure   Doxycycline  Other (See Comments)    Hallucinations, headaches   Promethazine -Codeine  Cough    Worsening cough and insomnia    Sulfamethoxazole-Trimethoprim  Other (See Comments)    Iodinated Contrast Media Rash    Patient allergic to contrast used in radiation oncology for CT simulation  Immunization History  Administered Date(s) Administered   Influenza Split 05/03/2011   Influenza Whole 03/11/2012   Influenza, Seasonal, Injecte, Preservative Fre 05/08/2023   Influenza,inj,Quad PF,6+ Mos 07/29/2017, 04/08/2019, 04/20/2020, 05/24/2021   Influenza-Unspecified 04/10/2022   PNEUMOCOCCAL CONJUGATE-20 08/18/2021   Pneumococcal Conjugate-13 08/09/2016   Pneumococcal-Unspecified 08/12/2011    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 [facility])    2019   Cancer, metastatic to liver (HCC)    2021   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)    Coccidioidomycosis 11/05/2023   Complication of anesthesia    bp dropped + desat    COPD (chronic obstructive pulmonary disease) (HCC)    Dyspnea    DOE   GERD (gastroesophageal reflux disease)    H/O coccidioidomycosis    was reason for lung lobectomy   Headache(784.0)    due to eye strain or not eating   Hemoptysis 05/08/2023   History of anemia    no current problem   History of asthma    as a child   History of breast cancer 2014   left   History of chemotherapy    finished 07/17/2013   History of hiatal hernia    AGE 22   Hx of radiation therapy 03/25/13-05/06/13   left breast 5000 cGy/25 sessions, left breast boost 1000 cGy/5 sessions   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   Personal history of chemotherapy    for liver cancer   Personal history of radiation therapy    Pneumonia    LAST FLARE UP 01/2018   Rash 02/22/2021   Runny nose 07/30/2013   clear drainage   Smoker 05/24/2021   Wears dentures    upper    Tobacco History: Social History   Tobacco Use  Smoking Status Every Day    Current packs/day: 0.30   Average packs/day: 0.3 packs/day for 38.0 years (11.4 ttl pk-yrs)   Types: Cigarettes  Smokeless Tobacco Never  Tobacco Comments   1 pack a day 06/10/2024 KRD   Ready to quit: Not Answered Counseling given: Not Answered Tobacco comments: 1 pack a day 06/10/2024 KRD   Continue to not smoke  Outpatient Encounter Medications as of 06/10/2024  Medication Sig   acetaminophen  (TYLENOL ) 325 MG tablet Take 2 tablets (650 mg total) by mouth every 6 (six) hours as needed for mild pain (or Fever >/= 101).   albuterol  (VENTOLIN  HFA) 108 (90 Base) MCG/ACT inhaler Inhale 2 puffs into the lungs every 6 (six) hours as needed for wheezing or shortness of breath (cough).   calcium carbonate (TUMS EX) 750 MG chewable tablet Chew by mouth.   chlorpheniramine -HYDROcodone  (TUSSIONEX) 10-8 MG/5ML Take 5 mLs by mouth 2 (two) times daily.   dexamethasone  (DECADRON ) 2 MG tablet Take 1 tablet (2 mg total) by mouth daily.   diphenhydrAMINE  (BENADRYL ) 50 MG tablet Take 50 mg of Benadryl  on 05/22/24 at 1:15 pm with the last dose of Prednisone  50 mg . Please call [phone] with any questions.   escitalopram (LEXAPRO) 10 MG tablet Take 10 mg by mouth daily.   levalbuterol  (XOPENEX ) 0.63 MG/3ML nebulizer solution Take 3 mLs (0.63 mg total) by nebulization every 4 (four) hours as needed for wheezing or  shortness of breath.   levETIRAcetam  (KEPPRA ) 250 MG tablet Take 1 tablet (250 mg total) by mouth 2 (two) times daily.   lidocaine -prilocaine  (EMLA ) cream APPLY EXTERNALLY TO THE AFFECTED AREA 1 TIME   Multiple Vitamins-Minerals (EMERGEN-C VITAMIN C PO) Take by mouth.   naproxen sodium (ALEVE) 220 MG tablet Take by mouth.   ondansetron  (ZOFRAN -ODT) 4 MG disintegrating tablet Take 4 mg by mouth every 8 (eight) hours as needed.   SYMBICORT  160-4.5 MCG/ACT inhaler Inhale 2 puffs into the lungs 2 (two) times daily.   [DISCONTINUED] predniSONE  (DELTASONE ) 50 MG tablet Pt to take 50 mg of prednisone  on  05/22/24 at 1:15 am, then 50 mg of prednisone  on 05/22/24 at 7:15 am, and then 50 mg of prednisone  on 05/22/24 at 1:15 pm. Pt is also to take 50 mg of benadryl  on 05/22/24 at 1:15 pm. Please call [phone] with any questions. (Patient not taking: Reported on 06/03/2024)   Facility-Administered Encounter Medications as of 06/10/2024  Medication   alteplase  (CATHFLO ACTIVASE ) injection 2 mg   sodium chloride  flush (NS) 0.9 % injection 10 mL     Review of Systems  Review of Systems  N/a Physical Exam  BP (!) 92/50   Pulse 96   Temp 98.2 F (36.8 C) (Oral)   Ht 5' 2 (1.575 m)   Wt 117 lb (53.1 kg)   LMP 07/25/2012 Comment: pregnancy waiver form signed 01-18-2018  SpO2 95%   BMI 21.40 kg/m   Wt Readings from Last 5 Encounters:  06/10/24 117 lb (53.1 kg)  06/03/24 116 lb 3.2 oz (52.7 kg)  05/17/24 112 lb 4.8 oz (50.9 kg)  04/22/24 108 lb 6.4 oz (49.2 kg)  04/18/24 106 lb 9.6 oz (48.4 kg)    BMI Readings from Last 5 Encounters:  06/10/24 21.40 kg/m  06/03/24 21.25 kg/m  05/17/24 20.54 kg/m  04/22/24 19.83 kg/m  04/18/24 19.50 kg/m     Physical Exam General: Sitting in chair, no acute distress Eyes: EOMI, no icterus Pulmonary: Normal work of breathing, on room air, bronchial or tubular breath sounds on the left, Cardiovascular warm, no edema  Assessment & Plan:   Hemoptysis: Resolved after stopping Avastin  therapy.  Possible aspergillosis: Historically was on chronic voriconazole , good therapeutic levels. Switched to posaconazole fall 2024.  Stopped in early 2025/late 2024 due to worsening cough.  Symptoms of cough stable off antifungal therapy prior CT scan 12/2023 without worsening signs of infection etc. unfortunately, increase size and left lower lobe cavity and air-fluid level as well as worsening cough and left-sided chest pain concerning for worsening disease.  Will arrange bronchoscopy with BAL.  Biopsy is very high risk for pneumothorax and I advised  against this.  Bronchiectasis: Suspect related to chronic infections.  Prior lobectomy due to fungal infection.  Encouraged ongoing airway clearance hypertonic saline and flutter valve.    No follow-ups on file.   Melissa JONELLE Beals, MD 06/10/2024

## 2024-06-10 NOTE — Telephone Encounter (Signed)
 Please schedule the following:  Provider performing procedure:Jane Kassie, MD Diagnosis: lung lesion Which side if for nodule / mass? left Procedure: Flexible bronchoscopy with BAL Has patient been spoken to by Provider and given informed consent?  Yes Anesthesia: General Do you need Fluro?  No Duration of procedure: 30 minutes Date: 06/14/2024 Alternate Date: N/A Time: PM preferred Location: [place] endoscopy Does patient have OSA?  No DM?  No or Latex allergy ?  No Medication Restriction/ Anticoagulate/Antiplatelet: No Pre-op Labs Ordered:determined by Anesthesia Imaging request: n/a  (If, SuperDimension CT Chest, please have STAT courier sent to ENDO)

## 2024-06-10 NOTE — Telephone Encounter (Signed)
 FYI Only or Action Required?: FYI only for provider: appointment scheduled on 06/10/24.  Patient is followed in Pulmonology for emphysema, aspergillus, obstructive bronchiectasis, last seen on 04/18/2024 by Hunsucker, [name], MD.  Called Nurse Triage reporting Cough (Painful cough).  Symptoms began several weeks ago.  Interventions attempted: Maintenance inhaler.  Symptoms are: stable.       E2C2 Pulmonary Triage - Initial Assessment Questions "Chief Complaint (e.g., cough, sob, wheezing, [name], chills, sweat or additional symptoms) *Go to specific symptom protocol after initial questions. New onset pain with chronic cough.   "How long have symptoms been present?" 2 to 2 1/2 weeks  Have you tested for COVID or Flu? Note: If not, ask patient if Melissa Melissa can be taken. If so, instruct patient to call back for positive results. Deferred  MEDICINES:   "Have you used any OTC meds to help with symptoms?" No If yes, ask "What medications?" Deferred, pt reports using prescribed inhaler.   "Have you used your inhalers/maintenance medication?" Yes If yes, "What medications?" Prescribed inhaler.  If inhaler, ask "How many puffs and how often?" Note: Review instructions on medication in the chart. Has been using daily since new onset pain.       Copied from CRM #[id number]. Topic: Clinical - Red Word Triage >> Jun 10, 2024  1:02 PM Melissa Melissa wrote: Red Word that prompted transfer to Nurse Triage:  Experiencing pain while coughing, was told she has an airpocket in left lung by her oncologist. Reason for Disposition  [1] Chest or rib pain AND [2] only occurs while coughing  Answer Assessment - Initial Assessment Questions 1. ONSET: When did the cough begin?      Chronic cough with new pain only when coughing for the last  2- 21/2 weeks, cough and phlegm production are chronic and typical   2. SEVERITY: How bad is the cough today?      Severe pain at times only when  coughing.     3. SPUTUM: Describe the color of your sputum (e.g., none, dry cough; clear, white, yellow, green)     Yellow, thick usually which is typical   4. HEMOPTYSIS: Are you coughing up any blood? If so ask: How much? (e.g., flecks, streaks, tablespoons, etc.)     Denies   5. DIFFICULTY BREATHING: Are you having difficulty breathing? If Yes, ask: How bad is it? (e.g., mild, moderate, severe)      Denies difficulty right now, seem to be breathing okay patient states, sometimes if overdoing it will then have to use inhaler;  Patient has been using inhaler every day within the last 2 weeks which is not typical  6. [name]: Do you have Melissa Melissa? If Yes, ask: What is your temperature, how was it measured, and when did it start?     Freezing for last 3 or 4 days, normal to be cold, was underweight and finally getting weight back up.    7. CARDIAC HISTORY: Do you have any history of heart disease? (e.g., heart attack, congestive heart failure)      Family hx only, denies personal history   8. LUNG HISTORY: Do you have any history of lung disease?  (e.g., pulmonary embolus, asthma, emphysema)       Aspergillus infection, has been on verconazole and taking for several years, had mold exposure at apartments in the past, denies any detoxification treatments.    9. PE RISK FACTORS: Do you have Melissa Melissa? (or: recent major  surgery, recent prolonged travel, bedridden)     Denies.    10. OTHER SYMPTOMS: Do you have any other symptoms? (e.g., runny nose, wheezing, chest pain)       Nose running for last 6 months, continuous, clear drainage.      11. TRAVEL: Have you traveled out of the country in the last month? (e.g., travel history, exposures)       Denies     Patient reports having Melissa Melissa 2 wks ago, has scans every 6 months. Oncologist noted Melissa Melissa and was going to forward CT to Pulomonolgist last Monday.    History of liver  cancer.  Protocols used: Cough - Chronic-Melissa-AH

## 2024-06-10 NOTE — Patient Instructions (Signed)
 Nice to see you again  I am worming to set up a bronchoscopy

## 2024-06-11 NOTE — Telephone Encounter (Signed)
 Patient seen in office

## 2024-06-13 ENCOUNTER — Encounter: Payer: Self-pay | Admitting: Pulmonary Disease

## 2024-06-13 NOTE — Telephone Encounter (Addendum)
 We were not aware of this bronchoscopy and it was routed to us  in the CRM at 4:43 PM yesterday. For future bronchoscopies, please route these to the [facility] Procedure Pool to avoid delays in patient care.   The patient is scheduled for the bronchoscopy at 8:45 AM, as this was the only available outpatient time.   Routing to Dr. Annella for awareness of procedure. Routing to Dr. Kassie for awareness of the scheduled time. Routing to [employer] for authorization.  Case #[id number]

## 2024-06-13 NOTE — Telephone Encounter (Signed)
 Copied from CRM #[id number]. Topic: Appointments - Scheduling Inquiry for Clinic >> Jun 11, 2024  2:43 PM Rozanna MATSU wrote: Reason for CRM: pt called to see when someone would be calling her about scheduling her bronchoscopy. >> Jun 12, 2024  3:19 PM Corean SAUNDERS wrote: Patient states she is stll waiting for a call to schedule her Bronchoscopy and has an appointment on Thursday and is worried she may need to cancel because she is waiting on a date  or time to schedule.

## 2024-06-14 ENCOUNTER — Ambulatory Visit (HOSPITAL_COMMUNITY)
Admission: RE | Admit: 2024-06-14 | Discharge: 2024-06-14 | Disposition: A | Attending: Pulmonary Disease | Admitting: Pulmonary Disease

## 2024-06-14 ENCOUNTER — Other Ambulatory Visit: Payer: Self-pay

## 2024-06-14 ENCOUNTER — Ambulatory Visit (HOSPITAL_COMMUNITY): Admitting: Anesthesiology

## 2024-06-14 ENCOUNTER — Ambulatory Visit (HOSPITAL_COMMUNITY)

## 2024-06-14 ENCOUNTER — Encounter (HOSPITAL_COMMUNITY): Payer: Self-pay | Admitting: Pulmonary Disease

## 2024-06-14 ENCOUNTER — Encounter: Admission: RE | Payer: Self-pay | Attending: Pulmonary Disease

## 2024-06-14 DIAGNOSIS — R918 Other nonspecific abnormal finding of lung field: Secondary | ICD-10-CM | POA: Diagnosis not present

## 2024-06-14 DIAGNOSIS — J984 Other disorders of lung: Secondary | ICD-10-CM

## 2024-06-14 DIAGNOSIS — R9389 Abnormal findings on diagnostic imaging of other specified body structures: Secondary | ICD-10-CM | POA: Diagnosis not present

## 2024-06-14 DIAGNOSIS — R0602 Shortness of breath: Secondary | ICD-10-CM | POA: Diagnosis not present

## 2024-06-14 HISTORY — PX: VIDEO BRONCHOSCOPY: SHX5072

## 2024-06-14 LAB — BODY FLUID CELL COUNT WITH DIFFERENTIAL
Eos, Fluid: 0 %
Lymphs, Fluid: 5 %
Monocyte-Macrophage-Serous Fluid: 1 % — ABNORMAL LOW (ref 50–90)
Neutrophil Count, Fluid: 94 % — ABNORMAL HIGH (ref 0–25)
Total Nucleated Cell Count, Fluid: 3750 uL — ABNORMAL HIGH (ref 0–1000)

## 2024-06-14 SURGERY — VIDEO BRONCHOSCOPY WITHOUT FLUORO
Anesthesia: General

## 2024-06-14 MED ORDER — FENTANYL CITRATE (PF) 100 MCG/2ML IJ SOLN: 25.0000 ug | INTRAMUSCULAR | Status: DC | PRN

## 2024-06-14 MED ORDER — ONDANSETRON HCL 4 MG/2ML IJ SOLN: 4.0000 mg | Freq: Once | INTRAMUSCULAR | Status: DC | PRN

## 2024-06-14 MED ORDER — DEXAMETHASONE SOD PHOSPHATE PF 10 MG/ML IJ SOLN
INTRAMUSCULAR | Status: DC | PRN
  Administered 2024-06-14: 10 mg via INTRAVENOUS

## 2024-06-14 MED ORDER — FENTANYL CITRATE (PF) 250 MCG/5ML IJ SOLN
INTRAMUSCULAR | Status: DC | PRN
  Administered 2024-06-14 (×2): 50 ug via INTRAVENOUS

## 2024-06-14 MED ORDER — HEPARIN SOD (PORK) LOCK FLUSH 100 UNIT/ML IV SOLN
500.0000 [IU] | INTRAVENOUS | Status: AC | PRN
  Administered 2024-06-14: 500 [IU]
  Filled 2024-06-14: qty 5

## 2024-06-14 MED ORDER — SUCCINYLCHOLINE CHLORIDE 200 MG/10ML IV SOSY
PREFILLED_SYRINGE | INTRAVENOUS | Status: DC | PRN
  Administered 2024-06-14: 100 mg via INTRAVENOUS

## 2024-06-14 MED ORDER — LIDOCAINE 2% (20 MG/ML) 5 ML SYRINGE
INTRAMUSCULAR | Status: DC | PRN
  Administered 2024-06-14: 100 mg via INTRAVENOUS

## 2024-06-14 MED ORDER — IPRATROPIUM-ALBUTEROL 0.5-2.5 (3) MG/3ML IN SOLN
3.0000 mL | Freq: Once | RESPIRATORY_TRACT | Status: AC
  Administered 2024-06-14: 3 mL via RESPIRATORY_TRACT

## 2024-06-14 MED ORDER — PROPOFOL 10 MG/ML IV BOLUS
INTRAVENOUS | Status: DC | PRN
  Administered 2024-06-14: 90 mg via INTRAVENOUS
  Administered 2024-06-14: 30 mg via INTRAVENOUS

## 2024-06-14 MED ORDER — IPRATROPIUM-ALBUTEROL 0.5-2.5 (3) MG/3ML IN SOLN
RESPIRATORY_TRACT | Status: AC
  Filled 2024-06-14: qty 3

## 2024-06-14 MED ORDER — ONDANSETRON HCL 4 MG/2ML IJ SOLN
INTRAMUSCULAR | Status: DC | PRN
  Administered 2024-06-14: 4 mg via INTRAVENOUS

## 2024-06-14 MED ORDER — PHENYLEPHRINE 80 MCG/ML (10ML) SYRINGE FOR IV PUSH (FOR BLOOD PRESSURE SUPPORT)
PREFILLED_SYRINGE | INTRAVENOUS | Status: DC | PRN
  Administered 2024-06-14: 160 ug via INTRAVENOUS
  Administered 2024-06-14: 80 ug via INTRAVENOUS

## 2024-06-14 MED ORDER — CHLORHEXIDINE GLUCONATE 0.12 % MT SOLN
OROMUCOSAL | Status: AC
  Filled 2024-06-14: qty 15

## 2024-06-14 MED ORDER — AMISULPRIDE (ANTIEMETIC) 5 MG/2ML IV SOLN: 10.0000 mg | Freq: Once | INTRAVENOUS | Status: DC | PRN

## 2024-06-14 MED ORDER — SODIUM CHLORIDE 0.9 % IV SOLN: INTRAVENOUS | Status: DC | PRN

## 2024-06-14 MED ORDER — ACETAMINOPHEN 500 MG PO TABS: 1000.0000 mg | ORAL_TABLET | Freq: Once | ORAL | Status: DC

## 2024-06-14 MED ORDER — CHLORHEXIDINE GLUCONATE 0.12 % MT SOLN
OROMUCOSAL | Status: DC | PRN
  Administered 2024-06-14: 15 mL via OROMUCOSAL

## 2024-06-14 MED ORDER — PROPOFOL 500 MG/50ML IV EMUL
INTRAVENOUS | Status: DC | PRN
  Administered 2024-06-14: 150 ug/kg/min via INTRAVENOUS

## 2024-06-14 MED ORDER — SODIUM CHLORIDE 0.9 % IV SOLN
INTRAVENOUS | Status: AC | PRN
  Administered 2024-06-14: 500 mL via INTRAMUSCULAR

## 2024-06-14 NOTE — Anesthesia Postprocedure Evaluation (Signed)
 Anesthesia Post Note  Patient: Melissa [name]  Procedure(s) Performed: VIDEO BRONCHOSCOPY WITHOUT FLUORO     Patient location during evaluation: Endoscopy Anesthesia Type: General Level of consciousness: awake and alert Pain management: pain level controlled Vital Signs Assessment: post-procedure vital signs reviewed and stable Respiratory status: spontaneous breathing, nonlabored ventilation and respiratory function stable Cardiovascular status: blood pressure returned to baseline and stable Postop Assessment: no apparent nausea or vomiting Anesthetic complications: no   There were no known notable events for this encounter.  Last Vitals:  Vitals:   06/14/24 1052 06/14/24 1100  BP:  91/61  Pulse: 92 87  Resp: 13 (!) 30  Temp:    SpO2: 95% 90%    Last Pain:  Vitals:   06/14/24 1100  TempSrc:   PainSc: 0-No pain                 Garnette FORBES Skillern

## 2024-06-14 NOTE — Anesthesia Procedure Notes (Signed)
 Procedure Name: Intubation Date/Time: 06/14/2024 7:47 AM  Performed by: Oley Aleck LABOR, CRNAPre-anesthesia Checklist: Patient identified, Emergency Drugs available, Suction available and Patient being monitored Patient Re-evaluated:Patient Re-evaluated prior to induction Oxygen Delivery Method: Circle system utilized Preoxygenation: Pre-oxygenation with 100% oxygen Induction Type: IV induction Ventilation: Mask ventilation without difficulty Laryngoscope Size: Mac and 3 Grade View: Grade I Tube type: Oral Tube size: 8.0 mm Number of attempts: 1 Airway Equipment and Method: Stylet Placement Confirmation: ETT inserted through vocal cords under direct vision, positive ETCO2 and breath sounds checked- equal and bilateral Secured at: 20 cm Tube secured with: Tape Dental Injury: Teeth and Oropharynx as per pre-operative assessment  Comments: intubated by C. Jimia Gentles, CRNA; ebbs

## 2024-06-14 NOTE — Interval H&P Note (Signed)
 History and Physical Interval Note:  06/14/2024 8:03 AM  Melissa [name]  has presented today for surgery, with the diagnosis of cavitary lung lesion.  The various methods of treatment have been discussed with the patient and family. After consideration of risks, benefits and other options for treatment, the patient has consented to  Procedure(s): VIDEO BRONCHOSCOPY WITHOUT FLUORO (N/A) as a surgical intervention.  The patient's history has been reviewed, patient examined, no change in status, stable for surgery.  I have reviewed the patient's chart and labs.  Questions were answered to the patient's satisfaction.     Regla Fitzgibbon Slater Staff, MD

## 2024-06-14 NOTE — Progress Notes (Signed)
 Pt saturating 86-92% on room air. Had duoneb post-procedure and given incentive spirometer. Walked and sat out in chair beside bed per Dr Kassie. Pt 93% pre-procedure. Pt doesn't not use home oxygen and doesn't have saturations monitor. Not feeling short of breath. D/w Dr Kassie, and she would like pt to stay for a while longer for monitoring. Chest x-ray ordered. Continue to monitor.

## 2024-06-14 NOTE — Discharge Instructions (Signed)
 Continue using incentive spirometer. If short of breath and feeling unwell, present to emergency department. If non-urgent, call Pulmonary office [phone] (on call doctor will take call).

## 2024-06-14 NOTE — Op Note (Signed)
 [facility] Cardiopulmonary Patient Name: Melissa [name] Date: 06/14/2024 MRN: [id number] Attending MD: Slater Staff , MD, [id number] Date of Birth: 13-Dec-1965 CSN: Finalized Age: 58 Admit Type: Inpatient Gender: Female Procedure:             Bronchoscopy Indications:           Abnormal CT scan of chest Providers:             Slater Staff, MD, Gregoria Pierce, RN, Felice Sar,                         Technician Referring MD:           Medicines:             General Anesthesia Complications:         No immediate complications Estimated Blood Loss:  Estimated blood loss: none. Procedure:             Pre-Anesthesia Assessment:                        - A History and Physical has been performed. Patient                         meds and allergies have been reviewed. The risks and                         benefits of the procedure and the sedation options and                         risks were discussed with the patient. All questions                         were answered and informed consent was obtained.                         Patient identification and proposed procedure were                         verified prior to the procedure by the physician in                         the pre-procedure area. Mental Status Examination:                         normal. Airway Examination: normal oropharyngeal                         airway. Respiratory Examination: rhonchi. CV                         Examination: RRR, no murmurs, no S3 or S4. ASA Grade                         Assessment: II - A patient with mild systemic disease.                         After reviewing the risks and benefits, the patient  was deemed in satisfactory condition to undergo the                         procedure. The anesthesia plan was to use general                         anesthesia. Immediately prior to administration of                         medications, the patient was  re-assessed for adequacy                         to receive sedatives. The heart rate, respiratory                         rate, oxygen saturations, blood pressure, adequacy of                         pulmonary ventilation, and response to care were                         monitored throughout the procedure. The physical                         status of the patient was re-assessed after the                         procedure.                        After obtaining informed consent, the bronchoscope was                         passed under direct vision. Throughout the procedure,                         the patient's blood pressure, pulse, and oxygen                         saturations were monitored continuously. the [id number]                         ([id number]) Olympus bronchoscope was introduced through                         the nose, via the endotracheal tube (the patient was                         intubated for the procedure) and advanced to the                         tracheobronchial tree. The procedure was accomplished                         with ease. Scope In: Scope Out: Findings:      The bronchoscope was advanced until wedged at the desired location for       bronchoalveolar lavage. BAL was performed in the left lower lobe of the  lung and sent for cell count, bacterial culture, viral smears & culture,       and fungal & AFB analysis and cytology. 100 mL of fluid were instilled.       60 mL were returned. The return was cloudy. Impression:            - Abnormal CT scan of chest                        - Bronchoalveolar lavage was performed. Moderate Sedation:      General anesthesia Recommendation:        - Await test results. Procedure Code(s):     --- Professional ---                        [phone], Bronchoscopy, rigid or flexible, including                         fluoroscopic guidance, when performed; with bronchial                         alveolar lavage Diagnosis  Code(s):     --- Professional ---                        R93.89, Abnormal findings on diagnostic imaging of                         other specified body structures CPT copyright 2022 [employer] Association. All rights reserved. The codes documented in this report are preliminary and upon coder review may  be revised to meet current compliance requirements. Slater Staff, MD 06/14/2024 9:08:51 AM This report has been signed electronically. Number of Addenda: 0

## 2024-06-14 NOTE — Transfer of Care (Signed)
 Immediate Anesthesia Transfer of Care Note  Patient: Melissa [name]  Procedure(s) Performed: VIDEO BRONCHOSCOPY WITHOUT FLUORO  Patient Location: PACU  Anesthesia Type:General  Level of Consciousness: drowsy and responds to stimulation  Airway & Oxygen Therapy: Patient Spontanous Breathing and Patient connected to face mask oxygen  Post-op Assessment: Report given to RN and Post -op Vital signs reviewed and stable  Post vital signs: Reviewed and stable  Last Vitals:  Vitals Value Taken Time  BP 132/72 06/14/24 09:12  Temp 36.3 C 06/14/24 09:12  Pulse 102 06/14/24 09:17  Resp 31 06/14/24 09:17  SpO2 91 % 06/14/24 09:17  Vitals shown include unfiled device data. Face mask 8l/min Last Pain:  Vitals:   06/14/24 0912  TempSrc: Temporal  PainSc: Asleep         Complications: No notable events documented.

## 2024-06-14 NOTE — Anesthesia Preprocedure Evaluation (Addendum)
 Anesthesia Evaluation  Patient identified by MRN, date of birth, ID band Patient awake    Reviewed: Allergy  & Precautions, NPO status , Patient's Chart, lab work & pertinent test results  Airway Mallampati: II  TM Distance: >3 FB Neck ROM: Full    Dental  (+) Dental Advisory Given, Upper Dentures, Lower Dentures   Pulmonary asthma , COPD,  COPD inhaler, Current Smoker cavitary lung lesion   Pulmonary exam normal breath sounds clear to auscultation       Cardiovascular negative cardio ROS Normal cardiovascular exam Rhythm:Regular Rate:Normal     Neuro/Psych  Headaches    GI/Hepatic hiatal hernia,GERD  ,,Metastatic liver cancer    Endo/Other  negative endocrine ROS    Renal/GU negative Renal ROS     Musculoskeletal  (+) Arthritis ,    Abdominal   Peds  Hematology  (+) Blood dyscrasia, anemia   Anesthesia Other Findings Day of surgery medications reviewed with the patient.  H/o left breast cancer   Reproductive/Obstetrics                              Anesthesia Physical Anesthesia Plan  ASA: 3  Anesthesia Plan: General   Post-op Pain Management: Tylenol  PO (pre-op)*   Induction: Intravenous  PONV Risk Score and Plan: 2  Airway Management Planned: Oral ETT  Additional Equipment:   Intra-op Plan:   Post-operative Plan: Extubation in OR  Informed Consent: I have reviewed the patients History and Physical, chart, labs and discussed the procedure including the risks, benefits and alternatives for the proposed anesthesia with the patient or authorized representative who has indicated his/her understanding and acceptance.     Dental advisory given  Plan Discussed with: CRNA  Anesthesia Plan Comments:          Anesthesia Quick Evaluation

## 2024-06-16 LAB — ACID FAST SMEAR (AFB, MYCOBACTERIA): Acid Fast Smear: NEGATIVE

## 2024-06-17 ENCOUNTER — Encounter (HOSPITAL_COMMUNITY): Payer: Self-pay | Admitting: Pulmonary Disease

## 2024-06-17 ENCOUNTER — Ambulatory Visit (HOSPITAL_COMMUNITY): Admission: RE | Admit: 2024-06-17 | Discharge: 2024-06-17 | Attending: Adult Health | Admitting: Adult Health

## 2024-06-17 DIAGNOSIS — Z171 Estrogen receptor negative status [ER-]: Secondary | ICD-10-CM | POA: Diagnosis not present

## 2024-06-17 DIAGNOSIS — C50212 Malignant neoplasm of upper-inner quadrant of left female breast: Secondary | ICD-10-CM | POA: Diagnosis not present

## 2024-06-17 DIAGNOSIS — Z7969 Long term (current) use of other immunomodulators and immunosuppressants: Secondary | ICD-10-CM | POA: Diagnosis not present

## 2024-06-17 DIAGNOSIS — I5189 Other ill-defined heart diseases: Secondary | ICD-10-CM | POA: Diagnosis not present

## 2024-06-17 DIAGNOSIS — Z0189 Encounter for other specified special examinations: Secondary | ICD-10-CM | POA: Diagnosis not present

## 2024-06-17 LAB — ECHOCARDIOGRAM COMPLETE
AR max vel: 2.48 cm2
AV Area VTI: 2.13 cm2
AV Area mean vel: 2.17 cm2
AV Mean grad: 3 mmHg
AV Peak grad: 5 mmHg
Ao pk vel: 1.12 m/s
Area-P 1/2: 4.19 cm2
Calc EF: 68.1 %
MV VTI: 3.73 cm2
S' Lateral: 2.7 cm
Single Plane A2C EF: 67.7 %
Single Plane A4C EF: 70.6 %

## 2024-06-17 LAB — CYTOLOGY - NON PAP

## 2024-06-17 NOTE — Progress Notes (Signed)
  Echocardiogram 2D Echocardiogram has been performed.  Melissa [name] 06/17/2024, 12:18 PM

## 2024-06-18 ENCOUNTER — Encounter: Payer: Self-pay | Admitting: Pulmonary Disease

## 2024-06-19 ENCOUNTER — Emergency Department (HOSPITAL_COMMUNITY)

## 2024-06-19 ENCOUNTER — Encounter (HOSPITAL_COMMUNITY): Payer: Self-pay | Admitting: *Deleted

## 2024-06-19 ENCOUNTER — Inpatient Hospital Stay (HOSPITAL_COMMUNITY)
Admission: EM | Admit: 2024-06-19 | Discharge: 2024-06-22 | DRG: 871 | Disposition: A | Attending: Internal Medicine | Admitting: Internal Medicine

## 2024-06-19 ENCOUNTER — Telehealth: Payer: Self-pay | Admitting: Internal Medicine

## 2024-06-19 ENCOUNTER — Other Ambulatory Visit: Payer: Self-pay

## 2024-06-19 DIAGNOSIS — B449 Aspergillosis, unspecified: Secondary | ICD-10-CM | POA: Diagnosis not present

## 2024-06-19 DIAGNOSIS — A419 Sepsis, unspecified organism: Principal | ICD-10-CM

## 2024-06-19 DIAGNOSIS — R531 Weakness: Secondary | ICD-10-CM | POA: Diagnosis not present

## 2024-06-19 DIAGNOSIS — E861 Hypovolemia: Secondary | ICD-10-CM

## 2024-06-19 DIAGNOSIS — J189 Pneumonia, unspecified organism: Secondary | ICD-10-CM | POA: Diagnosis not present

## 2024-06-19 LAB — COMPREHENSIVE METABOLIC PANEL WITH GFR
ALT: 21 U/L (ref 0–44)
AST: 22 U/L (ref 15–41)
Albumin: 2.4 g/dL — ABNORMAL LOW (ref 3.5–5.0)
Alkaline Phosphatase: 89 U/L (ref 38–126)
Anion gap: 14 (ref 5–15)
BUN: 9 mg/dL (ref 6–20)
CO2: 24 mmol/L (ref 22–32)
Calcium: 9.6 mg/dL (ref 8.9–10.3)
Chloride: 96 mmol/L — ABNORMAL LOW (ref 98–111)
Creatinine, Ser: 0.79 mg/dL (ref 0.44–1.00)
GFR, Estimated: >60 mL/min (ref >60)
Glucose, Bld: 84 mg/dL (ref 70–99)
Potassium: 3.5 mmol/L (ref 3.5–5.1)
Sodium: 134 mmol/L — ABNORMAL LOW (ref 135–145)
Total Bilirubin: 0.5 mg/dL (ref 0.0–1.2)
Total Protein: 7.8 g/dL (ref 6.5–8.1)

## 2024-06-19 LAB — I-STAT CG4 LACTIC ACID, ED
Lactic Acid, Venous: 3 mmol/L (ref 0.5–1.9)
Lactic Acid, Venous: 1.2 mmol/L (ref 0.5–1.9)

## 2024-06-19 LAB — URINALYSIS, W/ REFLEX TO CULTURE (INFECTION SUSPECTED)
Bacteria, UA: NONE SEEN
Bilirubin Urine: NEGATIVE
Glucose, UA: NEGATIVE mg/dL
Hgb urine dipstick: NEGATIVE
Ketones, ur: NEGATIVE mg/dL
Leukocytes,Ua: NEGATIVE
Nitrite: NEGATIVE
Protein, ur: NEGATIVE mg/dL
Specific Gravity, Urine: 1.01 (ref 1.005–1.030)
pH: 6 (ref 5.0–8.0)

## 2024-06-19 LAB — CBC WITH DIFFERENTIAL/PLATELET
Abs Immature Granulocytes: 0.31 K/uL — ABNORMAL HIGH (ref 0.00–0.07)
Basophils Absolute: 0.1 K/uL (ref 0.0–0.1)
Basophils Relative: 0 %
Eosinophils Absolute: 0.3 K/uL (ref 0.0–0.5)
Eosinophils Relative: 2 %
HCT: 34.8 % — ABNORMAL LOW (ref 36.0–46.0)
Hemoglobin: 11.4 g/dL — ABNORMAL LOW (ref 12.0–15.0)
Immature Granulocytes: 2 %
Lymphocytes Relative: 14 %
Lymphs Abs: 2.4 K/uL (ref 0.7–4.0)
MCH: 26.6 pg (ref 26.0–34.0)
MCHC: 32.8 g/dL (ref 30.0–36.0)
MCV: 81.3 fL (ref 80.0–100.0)
Monocytes Absolute: 1 K/uL (ref 0.1–1.0)
Monocytes Relative: 6 %
Neutro Abs: 12.7 K/uL — ABNORMAL HIGH (ref 1.7–7.7)
Neutrophils Relative %: 76 %
Platelets: 628 K/uL — ABNORMAL HIGH (ref 150–400)
RBC: 4.28 MIL/uL (ref 3.87–5.11)
RDW: 19.9 % — ABNORMAL HIGH (ref 11.5–15.5)
WBC: 16.7 K/uL — ABNORMAL HIGH (ref 4.0–10.5)
nRBC: 0 % (ref 0.0–0.2)

## 2024-06-19 LAB — AEROBIC/ANAEROBIC CULTURE W GRAM STAIN (SURGICAL/DEEP WOUND)

## 2024-06-19 LAB — MAGNESIUM: Magnesium: 1.8 mg/dL (ref 1.7–2.4)

## 2024-06-19 LAB — RESP PANEL BY RT-PCR (RSV, FLU A&B, COVID)  RVPGX2
Influenza A by PCR: NEGATIVE
Influenza B by PCR: NEGATIVE
Resp Syncytial Virus by PCR: NEGATIVE
SARS Coronavirus 2 by RT PCR: NEGATIVE

## 2024-06-19 LAB — HIV ANTIBODY (ROUTINE TESTING W REFLEX): HIV Screen 4th Generation wRfx: NONREACTIVE

## 2024-06-19 LAB — TYPE AND SCREEN
ABO/RH(D): O POS
Antibody Screen: NEGATIVE

## 2024-06-19 LAB — PROTIME-INR
INR: 1.1 (ref 0.8–1.2)
Prothrombin Time: 15.2 s (ref 11.4–15.2)

## 2024-06-19 MED ORDER — ACETAMINOPHEN 650 MG RE SUPP: 650.0000 mg | Freq: Four times a day (QID) | RECTAL | Status: DC | PRN

## 2024-06-19 MED ORDER — SODIUM CHLORIDE 0.9 % IV SOLN
2.0000 g | Freq: Three times a day (TID) | INTRAVENOUS | Status: DC
  Administered 2024-06-19 – 2024-06-21 (×5): 2 g via INTRAVENOUS
  Filled 2024-06-19 (×5): qty 12.5

## 2024-06-19 MED ORDER — LACTATED RINGERS IV SOLN: INTRAVENOUS | Status: DC

## 2024-06-19 MED ORDER — SODIUM CHLORIDE 0.9% FLUSH
3.0000 mL | Freq: Two times a day (BID) | INTRAVENOUS | Status: DC
  Administered 2024-06-20 – 2024-06-21 (×2): 3 mL via INTRAVENOUS

## 2024-06-19 MED ORDER — SODIUM CHLORIDE 0.9 % IV SOLN: 2.0000 g | Freq: Once | INTRAVENOUS | Status: DC

## 2024-06-19 MED ORDER — LACTATED RINGERS IV BOLUS (SEPSIS)
250.0000 mL | Freq: Once | INTRAVENOUS | Status: AC
  Administered 2024-06-19: 250 mL via INTRAVENOUS

## 2024-06-19 MED ORDER — VANCOMYCIN HCL IN DEXTROSE 1-5 GM/200ML-% IV SOLN: 1000.0000 mg | INTRAVENOUS | Status: DC

## 2024-06-19 MED ORDER — ACETAMINOPHEN 325 MG PO TABS: 650.0000 mg | ORAL_TABLET | Freq: Four times a day (QID) | ORAL | Status: DC | PRN

## 2024-06-19 MED ORDER — ALBUTEROL SULFATE (2.5 MG/3ML) 0.083% IN NEBU: 3.0000 mL | INHALATION_SOLUTION | Freq: Four times a day (QID) | RESPIRATORY_TRACT | Status: DC | PRN

## 2024-06-19 MED ORDER — LACTATED RINGERS IV BOLUS (SEPSIS)
500.0000 mL | Freq: Once | INTRAVENOUS | Status: AC
  Administered 2024-06-19: 500 mL via INTRAVENOUS

## 2024-06-19 MED ORDER — SODIUM CHLORIDE 0.9% FLUSH: 10.0000 mL | INTRAVENOUS | Status: DC | PRN

## 2024-06-19 MED ORDER — SODIUM CHLORIDE 0.9 % IV SOLN
500.0000 mg | INTRAVENOUS | Status: DC
  Filled 2024-06-19: qty 5

## 2024-06-19 MED ORDER — SODIUM CHLORIDE 0.9 % IV SOLN
500.0000 mg | Freq: Once | INTRAVENOUS | Status: AC
  Administered 2024-06-19: 500 mg via INTRAVENOUS
  Filled 2024-06-19: qty 5

## 2024-06-19 MED ORDER — ESCITALOPRAM OXALATE 10 MG PO TABS
10.0000 mg | ORAL_TABLET | Freq: Every day | ORAL | Status: DC
  Administered 2024-06-19 – 2024-06-22 (×4): 10 mg via ORAL
  Filled 2024-06-19 (×4): qty 1

## 2024-06-19 MED ORDER — FLUTICASONE FUROATE-VILANTEROL 100-25 MCG/ACT IN AEPB
1.0000 | INHALATION_SPRAY | Freq: Every day | RESPIRATORY_TRACT | Status: DC
  Administered 2024-06-20 – 2024-06-21 (×2): 1 via RESPIRATORY_TRACT
  Filled 2024-06-19: qty 28

## 2024-06-19 MED ORDER — SODIUM CHLORIDE 0.9 % IV SOLN: INTRAVENOUS | Status: DC

## 2024-06-19 MED ORDER — DEXAMETHASONE 4 MG PO TABS
2.0000 mg | ORAL_TABLET | Freq: Every day | ORAL | Status: DC
  Administered 2024-06-19 – 2024-06-22 (×4): 2 mg via ORAL
  Filled 2024-06-19 (×4): qty 1

## 2024-06-19 MED ORDER — ONDANSETRON 4 MG PO TBDP: 4.0000 mg | ORAL_TABLET | Freq: Three times a day (TID) | ORAL | Status: DC | PRN

## 2024-06-19 MED ORDER — LACTATED RINGERS IV BOLUS (SEPSIS)
1000.0000 mL | Freq: Once | INTRAVENOUS | Status: AC
  Administered 2024-06-19: 1000 mL via INTRAVENOUS

## 2024-06-19 MED ORDER — VANCOMYCIN HCL 1250 MG/250ML IV SOLN
1250.0000 mg | Freq: Once | INTRAVENOUS | Status: AC
  Administered 2024-06-19: 1250 mg via INTRAVENOUS
  Filled 2024-06-19: qty 250

## 2024-06-19 MED ORDER — FAMOTIDINE IN NACL 20-0.9 MG/50ML-% IV SOLN
20.0000 mg | Freq: Two times a day (BID) | INTRAVENOUS | Status: DC
  Filled 2024-06-19: qty 50

## 2024-06-19 MED ORDER — SODIUM CHLORIDE 0.9% FLUSH
10.0000 mL | Freq: Two times a day (BID) | INTRAVENOUS | Status: DC
  Administered 2024-06-19 – 2024-06-21 (×4): 10 mL

## 2024-06-19 MED ORDER — SODIUM CHLORIDE 0.9 % IV SOLN
2.0000 g | Freq: Once | INTRAVENOUS | Status: AC
  Administered 2024-06-19: 2 g via INTRAVENOUS
  Filled 2024-06-19: qty 12.5

## 2024-06-19 MED ORDER — LEVETIRACETAM 250 MG PO TABS
250.0000 mg | ORAL_TABLET | Freq: Two times a day (BID) | ORAL | Status: DC
  Administered 2024-06-19 – 2024-06-22 (×6): 250 mg via ORAL
  Filled 2024-06-19 (×6): qty 1

## 2024-06-19 MED ORDER — HYDROCODONE-ACETAMINOPHEN 5-325 MG PO TABS: 1.0000 | ORAL_TABLET | ORAL | Status: DC | PRN

## 2024-06-19 MED ORDER — CHLORHEXIDINE GLUCONATE CLOTH 2 % EX PADS
6.0000 | MEDICATED_PAD | Freq: Every day | CUTANEOUS | Status: DC
  Administered 2024-06-20 – 2024-06-21 (×2): 6 via TOPICAL

## 2024-06-19 NOTE — ED Provider Notes (Addendum)
 [place] EMERGENCY DEPARTMENT AT [facility] Provider Note   CSN: [id number] Arrival date & time: 06/19/24  1030     Patient presents with: Fatigue   Melissa [name] is a 58 y.o. female.   HPI   Presents because of chills, fatigue.  States that she had a bronchoscopy on Friday.  Since then, has had persistent fatigue.  Chills.  Been sleeping for approximate 18 hours a day which is atypical for her.  Chronic cough.  No chest pain.  No pleuritic chest pain or hemoptysis.  No abdominal pain.  No nausea vomit diarrhea.  No dysuria.  No skin breakdown or skin lesions or erythema anywhere.  Patient states that she has been having chronic fatigue as well as worsening weakness ever since the bronchoscopy.    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  CT chest in November showed enlargement of chronic cavitary versus severely dilated bronchial/bronchus stasis with air-fluid level.  Bronch was subsequently ordered.  Ascertain fungal versus bacterial and help decide treatment moving forward.  Reviewed microdata from [employer].  Grew out Pseudomonas.  Initial fungal exam unremarkable.  Acid-fast smear negative.   Prior to Admission medications   Medication Sig Start Date End Date Taking? Authorizing Provider  acetaminophen  (TYLENOL ) 325 MG tablet Take 2 tablets (650 mg total) by mouth every 6 (six) hours as needed for mild pain (or Fever >/= 101). 07/26/21   Pearlean Manus, MD  albuterol  (VENTOLIN  HFA) 108 (90 Base) MCG/ACT inhaler Inhale 2 puffs into the lungs every 6 (six) hours as needed for wheezing or shortness of breath (cough). 07/26/21   Pearlean Manus, MD  chlorpheniramine -HYDROcodone  (TUSSIONEX) 10-8  MG/5ML Take 5 mLs by mouth 2 (two) times daily. 05/27/24   Odean Potts, MD  dexamethasone  (DECADRON ) 2 MG tablet Take 1 tablet (2 mg total) by mouth daily. 03/12/24   Crawford Morna Pickle, NP  diphenhydrAMINE  (BENADRYL ) 50 MG tablet Take 50 mg of Benadryl  on 05/22/24 at 1:15 pm with the last dose of Prednisone  50 mg . Please call [phone] with any questions. 05/20/24   Karalee Wilkie POUR, MD  escitalopram  (LEXAPRO ) 10 MG tablet Take 10 mg by mouth daily.    [provider]  levalbuterol  (XOPENEX ) 0.63 MG/3ML nebulizer solution Take 3 mLs (0.63 mg total) by nebulization every 4 (four) hours as needed for wheezing or shortness of breath. 01/10/23   Gudena, Vinay, MD  levETIRAcetam  (KEPPRA ) 250 MG tablet Take 1 tablet (250 mg total) by mouth 2 (two) times daily. 05/29/24   Vaslow, Zachary K, MD  lidocaine -prilocaine  (EMLA ) cream APPLY EXTERNALLY TO THE AFFECTED AREA 1 TIME 01/30/23   Gudena, Vinay, MD  Multiple Vitamins-Minerals (EMERGEN-C VITAMIN C PO) Take by mouth.    [provider]  naproxen sodium (ALEVE) 220 MG tablet Take by mouth.    [provider]  ondansetron  (ZOFRAN -ODT) 4 MG disintegrating tablet Take 4 mg by mouth every 8 (eight) hours as needed. 12/30/22   [provider]  SYMBICORT  160-4.5 MCG/ACT inhaler Inhale 2 puffs into the lungs 2 (two) times daily. 05/23/22   [provider]    Allergies: Aspirin, Protonix  [pantoprazole ], Doxycycline , Promethazine -codeine , Sulfamethoxazole-trimethoprim , and Iodinated contrast media    Review of Systems  Constitutional:  Negative  for chills and fever.  HENT:  Negative for ear pain and sore throat.   Eyes:  Negative for pain and visual disturbance.  Respiratory:  Negative for cough and shortness of breath.   Cardiovascular:  Negative for chest pain and palpitations.  Gastrointestinal:  Negative for abdominal pain and vomiting.  Genitourinary:  Negative for dysuria and hematuria.   Musculoskeletal:  Negative for arthralgias and back pain.  Skin:  Negative for color change and rash.  Neurological:  Negative for seizures and syncope.  All other systems reviewed and are negative.   Updated Vital Signs BP 117/64   Pulse 77   Temp 97.7 F (36.5 C) (Oral)   Resp 17   LMP 07/25/2012 Comment: pregnancy waiver form signed 01-18-2018  SpO2 100%   Physical Exam Vitals and nursing note reviewed.  Constitutional:      General: She is not in acute distress.    Appearance: She is well-developed.  HENT:     Head: Normocephalic and atraumatic.  Eyes:     Conjunctiva/sclera: Conjunctivae normal.  Cardiovascular:     Rate and Rhythm: Normal rate and regular rhythm.     Heart sounds: No murmur heard. Pulmonary:     Effort: Pulmonary effort is normal. No respiratory distress.     Breath sounds: Normal breath sounds.  Abdominal:     Palpations: Abdomen is soft.     Tenderness: There is no abdominal tenderness.  Musculoskeletal:        General: No swelling.     Cervical back: Neck supple.  Skin:    General: Skin is warm and dry.     Capillary Refill: Capillary refill takes less than 2 seconds.  Neurological:     Mental Status: She is alert.  Psychiatric:        Mood and Affect: Mood normal.     (all labs ordered are listed, but only abnormal results are displayed) Labs Reviewed  COMPREHENSIVE METABOLIC PANEL WITH GFR - Abnormal; Notable for the following components:      Result Value   Sodium 134 (*)    Chloride 96 (*)    Albumin 2.4 (*)    All other components within normal limits  CBC WITH DIFFERENTIAL/PLATELET - Abnormal; Notable for the following components:   WBC 16.7 (*)    Hemoglobin 11.4 (*)    HCT 34.8 (*)    RDW 19.9 (*)    Platelets 628 (*)    Neutro Abs 12.7 (*)    Abs Immature Granulocytes 0.31 (*)    All other components within normal limits  I-STAT CG4 LACTIC ACID, ED - Abnormal; Notable for the following components:   Lactic Acid,  Venous 3.0 (*)    All other components within normal limits  RESP PANEL BY RT-PCR (RSV, FLU A&B, COVID)  RVPGX2  CULTURE, BLOOD (ROUTINE X 2)  CULTURE, BLOOD (ROUTINE X 2)  MRSA NEXT GEN BY PCR, NASAL  URINALYSIS, W/ REFLEX TO CULTURE (INFECTION SUSPECTED)  PROTIME-INR  I-STAT CG4 LACTIC ACID, ED    EKG: EKG Interpretation Date/Time:  Wednesday June 19 2024 11:36:37 EST Ventricular Rate:  104 PR Interval:  120 QRS Duration:  78 QT Interval:  306 QTC Calculation: 402 R Axis:   73  Text Interpretation: Sinus tachycardia Otherwise normal ECG When compared with ECG of 25-Jul-2021 11:36, PREVIOUS ECG IS PRESENT Confirmed by Simon Rea [id number]) on 06/19/2024 12:24:22 PM  Radiology: CT Chest Wo Contrast Result Date: 06/19/2024 CLINICAL DATA:  Left upper lobe cavitary  lesion and hydropneumothorax. EXAM: CT CHEST WITHOUT CONTRAST TECHNIQUE: Multidetector CT imaging of the chest was performed following the standard protocol without IV contrast. RADIATION DOSE REDUCTION: This exam was performed according to the departmental dose-optimization program which includes automated exposure control, adjustment of the mA and/or kV according to patient size and/or use of iterative reconstruction technique. COMPARISON:  Chest radiograph dated 06/19/2024 and CT dated 05/22/2024. FINDINGS: Evaluation of this exam is limited in the absence of intravenous contrast. Cardiovascular: There is no cardiomegaly or pericardial effusion. The thoracic aorta and central pulmonary arteries are grossly unremarkable. Right-sided Port-A-Cath with tip in the central SVC close to the cavoatrial junction. Mediastinum/Nodes: No obvious hilar adenopathy. Cardiopulmonary window lymph nodes measure up to 8 mm. The esophagus is grossly unremarkable. No mediastinal fluid collection. Lungs/Pleura: Postsurgical changes of the left upper lobectomy. There is bronchiectasis of the left lower lobe. Large cavitary space in the left lung  with air-fluid level. Slight interval increase in the size of the fluid content within the cavitation since the prior CT. The upper cavitary component measures approximately 12 x 6 cm in greatest axial dimensions (previously 11 x 5 cm). There is diffuse ground-glass nodular density throughout the lungs, new since the prior CT most consistent with an infectious process, likely atypical infection. The central airways are patent. Upper Abdomen: No acute abnormality. Musculoskeletal: Degenerative changes. No acute osseous pathology. Left axillary surgical clips. IMPRESSION: 1. Postsurgical changes of the left upper lobectomy. 2. Large cavitary space in the left lung with air-fluid level. Slight interval increase of the fluid content within the cavitation since the prior CT. 3. Diffuse ground-glass nodular density throughout the lungs, new since the prior CT most consistent with an infectious process, likely atypical infection. Electronically Signed   By: Vanetta Chou M.D.   On: 06/19/2024 15:01   DG Chest 2 View Result Date: 06/19/2024 EXAM: 2 VIEW(S) XRAY OF THE CHEST 06/19/2024 11:49:00 AM COMPARISON: 06/14/2024 CLINICAL HISTORY: Fatigue FINDINGS: LINES, TUBES AND DEVICES: Right chest Port-A-Cath in place with tip overlying the expected region of the superior cavoatrial junction, stable. LUNGS AND PLEURA: Chronic volume loss in the left hemithorax. Cavitary lesion in the left upper lobe with associated loculated hydropneumothorax. Elevated left hemidiaphragm. HEART AND MEDIASTINUM: No acute abnormality of the cardiac and mediastinal silhouettes. BONES AND SOFT TISSUES: Left axillary surgical clips noted. No acute osseous abnormality. IMPRESSION: 1. Cavitary lesion in the left upper lobe with associated loculated hydropneumothorax. 2. Chronic volume loss in the left hemithorax and elevated left hemidiaphragm. Electronically signed by: Evalene Coho MD 06/19/2024 12:08 PM EST RP Workstation: [id number]      Procedures   Medications Ordered in the ED  lactated ringers  infusion ( Intravenous New Bag/Given 06/19/24 1426)  vancomycin  (VANCOREADY) IVPB 1250 mg/250 mL (1,250 mg Intravenous New Bag/Given 06/19/24 1510)  sodium chloride  flush (NS) 0.9 % injection 10-40 mL (has no administration in time range)  sodium chloride  flush (NS) 0.9 % injection 10-40 mL (has no administration in time range)  Chlorhexidine  Gluconate Cloth 2 % PADS 6 each (has no administration in time range)  lactated ringers  bolus 1,000 mL (1,000 mLs Intravenous New Bag/Given 06/19/24 1320)    And  lactated ringers  bolus 500 mL (500 mLs Intravenous New Bag/Given 06/19/24 1425)    And  lactated ringers  bolus 250 mL (250 mLs Intravenous New Bag/Given 06/19/24 1426)  azithromycin  (ZITHROMAX ) 500 mg in sodium chloride  0.9 % 250 mL IVPB (500 mg Intravenous New Bag/Given 06/19/24 1355)  ceFEPIme  (MAXIPIME ) 2  g in sodium chloride  0.9 % 100 mL IVPB (0 g Intravenous Stopped 06/19/24 1349)                                    Medical Decision Making Amount and/or Complexity of Data Reviewed Labs: ordered. Radiology: ordered.  Risk OTC drugs. Prescription drug management.     HPI:    Presents because of chills, fatigue.  States that she had a bronchoscopy on Friday.  Since then, has had persistent fatigue.  Chills.  Been sleeping for approximate 18 hours a day which is atypical for her.  Chronic cough.  No chest pain.  No pleuritic chest pain or hemoptysis.  No abdominal pain.  No nausea vomit diarrhea.  No dysuria.  No skin breakdown or skin lesions or erythema anywhere.  Patient states that she has been having chronic fatigue as well as worsening weakness ever since the bronchoscopy.    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  CT chest in November showed enlargement of chronic cavitary versus severely dilated bronchial/bronchus stasis with air-fluid level.  Bronch was subsequently ordered.  Ascertain fungal versus bacterial and help decide treatment moving forward.  Reviewed microdata from [employer].  Grew out Pseudomonas.  Initial fungal exam unremarkable.  Acid-fast smear negative.    MDM:   On exam, patient slightly hypotensive.  Patient already had lactic acid drawn.  Elevated as well.  Given patient is on chemotherapy, concern for possible sepsis versus SIRS versus bacteremia versus pneumonia versus UTI.  Soft and benign abdomen.  No concerns for intra-abdominal pathology at this time.  Plan will be for obtaining chest x-ray.  Laboratory workup blood cultures.  Reviewed patient's recent micro from her bronc.  Grew out Pseudomonas.  Given this guy will need pseudomonal coverage.  Therefore, started cefepime .  Added on vancomycin  as well for further MRSA coverage.  Patient started on 1500 cc of fluid.  Reassess.  Reevaluation:     Upon exam, patient still ANO x 3 with GCS 15.  After fluid resuscitation, maps appropriate.  Maps well above 70.  Mentating appropriately.  Will continue to track with lactic acid.  Chest x-ray showed possible hydropneumothorax and the cavitary lesion on the left upper lung.  Spoke to pulmonology regarding this.  Requested CT scan without contrast and they will see the patient.  No indication for emergent chest tube.   Patient be admitted in setting of concern for sepsis.  Concern for pulmonary source.  Concerns over pulmonary embolism.  No chest pain or shortness of breath.  No hemoptysis.  No indication for CTA.     Interventions: 1750 cc bolus, cefepime , vanc,   EKG Interpreted by Me: sinus    Cardiac Tele Interpreted by Me: sinus    I have independently interpreted the CXR  and  CT  images and agree with the radiologist finding   Social Determinant of Health: Denies alcohol    Disposition and Follow Up: admit   CRITICAL CARE Performed by: Lavonia LOISE Pat   Total critical care time: 45 minutes  Critical care time was exclusive of separately billable procedures and treating other patients.  Critical care was necessary  to treat or prevent imminent or life-threatening deterioration.  Critical care was time spent personally by me on the following activities: development of treatment plan with patient and/or surrogate as well as nursing, discussions with consultants, evaluation of patient's response to treatment, examination of patient, obtaining history from patient or surrogate, ordering and performing treatments and interventions, ordering and review of laboratory studies, ordering and review of radiographic studies, pulse oximetry and re-evaluation of patient's condition.       Final diagnoses:  Sepsis without acute organ dysfunction, due to unspecified organism [facility])  Pneumonia of left upper lobe due to infectious organism  Hypotension due to hypovolemia    ED Discharge Orders     None          Simon Lavonia SAILOR, MD 06/19/24 1524    Simon Lavonia SAILOR, MD 06/19/24 1525

## 2024-06-19 NOTE — ED Triage Notes (Signed)
 Pt has not been feeling well and she had a CT which showed something on her left lung which prompted them to order a bronchoscopy which she had Friday.  Pt has been exhaused, she has been sleeping 18 hours a day. Lack of appetitie.

## 2024-06-19 NOTE — ED Provider Triage Note (Signed)
 Emergency Medicine Provider Triage Evaluation Note  Melissa [name] , a 58 y.o. female  was evaluated in triage.  Pt complains of cough, shortness of breath, and generalized weakness.  The patient did have a bronchoscopy on 12-5.  Reports symptoms have worsened since then.  Has been having cough productive of yellow sputum as well as some blood streaking.  Has not been on any antibiotics.  She is currently undergoing chemotherapy and radiation for metastatic breast cancer  Review of Systems  Positive: See above Negative: Chest pain  Physical Exam  BP (!) 99/53   Pulse 94   Temp 97.7 F (36.5 C) (Oral)   Resp (!) 28   LMP 07/25/2012 Comment: pregnancy waiver form signed 01-18-2018  SpO2 97%  Gen:   Awake, no distress   Resp:  Normal effort  MSK:   Moves extremities without difficulty  Other:  Coarse breath sounds noted over left middle and lower lung field  Medical Decision Making  Medically screening exam initiated at 11:34 AM.  Appropriate orders placed.  Melissa [name] was informed that the remainder of the evaluation will be completed by another provider, this initial triage assessment does not replace that evaluation, and the importance of remaining in the ED until their evaluation is complete.  Sepsis workup ordered.  Will cover the patient with cefepime  and azithromycin  in the event that she is neutropenic being on chemotherapy rather than Rocephin /azithromycin .   Melissa Prentice SAUNDERS, MD 06/19/24 [phone]

## 2024-06-19 NOTE — Progress Notes (Addendum)
 ED Pharmacy Antibiotic Sign Off An antibiotic consult was received from an ED provider for cefepime  per pharmacy dosing for HCAP. A chart review was completed to assess appropriateness.   The following one time order(s) were placed:  Cefepime  2g Vancomcyin 1250mg   Further antibiotic and/or antibiotic pharmacy consults should be ordered by the admitting provider if indicated.   Thank you for allowing pharmacy to be a part of this patient's care.   Leonor GORMAN Bash, [facility]  Clinical Pharmacist 06/19/24 11:35 AM

## 2024-06-19 NOTE — ED Notes (Signed)
 IV access unsuccessful. RN to access port.

## 2024-06-19 NOTE — H&P (Signed)
 History and Physical    Patient: Melissa [name] FMW:[id number] DOB: [birth year] DOA: 06/19/2024 DOS: the patient was seen and examined on 06/19/2024 . PCP: [name], MD  Patient coming from: Home Chief complaint: Chief Complaint  Patient presents with   Fatigue   HPI:  Melissa [name] is a 58 y.o. female with pmhCancer of left breast metastatic to Brain  on Avastin , aspergillosis and  coccidiomycosis-- s/p lobectomy and f/u with pulmonology coming today for generalized weakness.  Patient has been having increased cough for the past 2 weeks and then feels worse since her bronchoscopy.  ED Course:  Vital signs in the ED were notable for the following:  Vitals:   06/19/24 1330 06/19/24 1345 06/19/24 1425 06/19/24 1445  BP: 97/61 101/68 96/61 117/64  Pulse: 78 75 77 77  Temp:      Resp:      SpO2: 98% 100% 100% 100%  TempSrc:       >>ED evaluation thus far shows:  EKG shows sinus tach at 104 PR 128 QTc of 402.  CMP shows sodium of 134 and albumin 2.4.  CBC shows wbc of 16.7 and hb of 11.4, plt of 628.  Lactic of 3.0 and repeat is pending.  Viral panel negative for influenza, rsv, covid.   Urinalysis is pending.   Blood culture collected and is pending.   Ct chest noncontrast shows large cavitary lesion and air fluid level and diffuse ground glass density throughout the lungs.   Pulmonology is consulted.   >>While in the ED patient received the following: Medications  lactated ringers  infusion ( Intravenous New Bag/Given 06/19/24 1426)  vancomycin  (VANCOREADY) IVPB 1250 mg/250 mL (has no administration in time range)  sodium chloride  flush (NS) 0.9 % injection 10-40 mL (has no administration in time range)  sodium chloride  flush (NS) 0.9 % injection 10-40 mL (has no administration in time range)  Chlorhexidine  Gluconate Cloth 2 % PADS 6 each (has no administration in time range)  lactated ringers  bolus 1,000 mL (1,000 mLs Intravenous New Bag/Given 06/19/24 1320)     And  lactated ringers  bolus 500 mL (500 mLs Intravenous New Bag/Given 06/19/24 1425)    And  lactated ringers  bolus 250 mL (250 mLs Intravenous New Bag/Given 06/19/24 1426)  azithromycin  (ZITHROMAX ) 500 mg in sodium chloride  0.9 % 250 mL IVPB (500 mg Intravenous New Bag/Given 06/19/24 1355)  ceFEPIme  (MAXIPIME ) 2 g in sodium chloride  0.9 % 100 mL IVPB (0 g Intravenous Stopped 06/19/24 1349)   Review of Systems  Respiratory:  Positive for cough.   Neurological:  Positive for weakness.   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[facility])    Coccidioidomycosis 11/05/2023   Complication of anesthesia    bp dropped + desat    COPD (chronic obstructive pulmonary disease) (HCC)    Dyspnea    DOE   GERD (gastroesophageal reflux disease)    H/O coccidioidomycosis    was reason for lung lobectomy   Headache(784.0)    due to eye strain or not eating  Hemoptysis 05/08/2023   History of anemia    no current problem   History of asthma    as a child   History of breast cancer 2014   left   History of chemotherapy    finished 07/17/2013   History of hiatal hernia    AGE 29   Hx of radiation therapy 03/25/13-05/06/13   left breast 5000 cGy/25 sessions, left breast boost 1000 cGy/5 sessions   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   Personal history of chemotherapy    for liver cancer   Personal history of radiation therapy    Pneumonia    LAST FLARE UP 01/2018   Rash 02/22/2021   Runny nose 07/30/2013   clear drainage   Smoker 05/24/2021   Wears dentures    upper   Past Surgical History:  Procedure Laterality Date   APPLICATION OF CRANIAL NAVIGATION N/A 08/04/2017   Procedure:  APPLICATION OF CRANIAL NAVIGATION;  Surgeon: [name], Morene Hicks, MD;  Location: [facility] OR;  Service: Neurosurgery;  Laterality: N/A;   AXILLARY LYMPH NODE DISSECTION Left 02/08/2013   Procedure: LEFT AXILLARY DISSECTION;  Surgeon: Morene ONEIDA Olives, MD;  Location: [facility];  Service: General;  Laterality: Left;   BREAST CYST EXCISION Right 2006   BREAST LUMPECTOMY Left 2014   BREAST LUMPECTOMY WITH NEEDLE LOCALIZATION AND AXILLARY SENTINEL LYMPH NODE BX Left 12/31/2012   Procedure: NEEDLE LOCALIZATION LEFT BREAST LUMPECTOMY AND LEFT AXILLARY SENTENIAL LYMPH NODE BX;  Surgeon: Morene ONEIDA Olives, MD;  Location: [facility];  Service: General;  Laterality: Left;   BRONCHIAL BRUSHINGS  03/17/2020   Procedure: BRONCHIAL BRUSHINGS;  Surgeon: Brenna Adine CROME, DO;  Location: [facility];  Service: Pulmonary;;   BRONCHIAL WASHINGS  03/17/2020   Procedure: BRONCHIAL WASHINGS;  Surgeon: Brenna Adine CROME, DO;  Location: [facility];  Service: Pulmonary;;   CESAREAN SECTION  1995/1996   CRANIOTOMY Right 08/04/2017   Procedure: Right Frontal craniotomy for resection of tumor with stereotactic navigation;  Surgeon: [name], Morene Hicks, MD;  Location: [facility] OR;  Service: Neurosurgery;  Laterality: Right;  Right Frontal craniotomy for resection of tumor with stereotactic navigation   IR CV LINE INJECTION  02/19/2021   IR CV LINE INJECTION  11/12/2021   IR IMAGING GUIDED PORT INSERTION  05/31/2019   LUNG LOBECTOMY Left 05/1996   upper lobe - due to [facility] Fever   PORT-A-CATH REMOVAL Right 08/02/2013   Procedure: REMOVAL PORT-A-CATH;  Surgeon: Morene ONEIDA Olives, MD;  Location: [facility];  Service: General;  Laterality: Right;   PORTACATH PLACEMENT  07/02/2012   Procedure: INSERTION PORT-A-CATH;  Surgeon: Morene ONEIDA Olives, MD;  Location: [facility];  Service: General;  Laterality: N/A;  right   VIDEO BRONCHOSCOPY N/A 03/17/2020   Procedure:  VIDEO BRONCHOSCOPY;  Surgeon: Brenna Adine CROME, DO;  Location: [facility];  Service: Pulmonary;  Laterality: N/A;   VIDEO BRONCHOSCOPY N/A 06/14/2024   Procedure: VIDEO BRONCHOSCOPY WITHOUT FLUORO;  Surgeon: Kassie Acquanetta Bradley, MD;  Location: [facility];  Service: Pulmonary;  Laterality: N/A;   VIDEO BRONCHOSCOPY WITH ENDOBRONCHIAL ULTRASOUND N/A 05/23/2018   Procedure: VIDEO BRONCHOSCOPY WITH ENDOBRONCHIAL ULTRASOUND;  Surgeon: Brenna Adine CROME, DO;  Location: [facility] OR;  Service: Thoracic;  Laterality: N/A;    reports that she has been smoking cigarettes. She has a 11.4 pack-year smoking history. She has never used smokeless tobacco. She reports that she does not drink alcohol and does not use drugs. Allergies  Allergen Reactions   Aspirin  Anaphylaxis and Shortness Of Breath    THROAT CLOSES   Protonix  [Pantoprazole ] Nausea Only and Other (See Comments)    Also caused a film in the mouth and caused chest pressure   Doxycycline  Other (See Comments)    Hallucinations, headaches   Promethazine -Codeine  Cough    Worsening cough and insomnia    Sulfamethoxazole-Trimethoprim  Other (See Comments)   Iodinated Contrast Media Rash    Patient allergic to contrast used in radiation oncology for CT simulation    Family History  Problem Relation Age of Onset   Cancer Mother        breast   Emphysema Mother        was a smoker   Heart disease Mother    Melanoma Mother        dx in her 36s   Breast cancer Mother 73   Asthma Brother    Cancer Cousin        breast   Breast cancer Cousin 41 - 74       mother's maternal cousin   Prior to Admission medications   Medication Sig Start Date End Date Taking? Authorizing Provider  acetaminophen  (TYLENOL ) 325 MG tablet Take 2 tablets (650 mg total) by mouth every 6 (six) hours as needed for mild pain (or Fever >/= 101). 07/26/21   Pearlean Manus, MD  albuterol  (VENTOLIN  HFA) 108 (90 Base) MCG/ACT inhaler Inhale 2 puffs into the lungs every 6 (six)  hours as needed for wheezing or shortness of breath (cough). 07/26/21   Pearlean Manus, MD  chlorpheniramine -HYDROcodone  (TUSSIONEX) 10-8 MG/5ML Take 5 mLs by mouth 2 (two) times daily. 05/27/24   Odean Potts, MD  dexamethasone  (DECADRON ) 2 MG tablet Take 1 tablet (2 mg total) by mouth daily. 03/12/24   Crawford Morna Pickle, NP  diphenhydrAMINE  (BENADRYL ) 50 MG tablet Take 50 mg of Benadryl  on 05/22/24 at 1:15 pm with the last dose of Prednisone  50 mg . Please call [phone] with any questions. 05/20/24   Karalee Wilkie POUR, MD  escitalopram  (LEXAPRO ) 10 MG tablet Take 10 mg by mouth daily.    [provider]  levalbuterol  (XOPENEX ) 0.63 MG/3ML nebulizer solution Take 3 mLs (0.63 mg total) by nebulization every 4 (four) hours as needed for wheezing or shortness of breath. 01/10/23   Gudena, Vinay, MD  levETIRAcetam  (KEPPRA ) 250 MG tablet Take 1 tablet (250 mg total) by mouth 2 (two) times daily. 05/29/24   Vaslow, Zachary K, MD  lidocaine -prilocaine  (EMLA ) cream APPLY EXTERNALLY TO THE AFFECTED AREA 1 TIME 01/30/23   Gudena, Vinay, MD  Multiple Vitamins-Minerals (EMERGEN-C VITAMIN C PO) Take by mouth.    [provider]  naproxen sodium (ALEVE) 220 MG tablet Take by mouth.    [provider]  ondansetron  (ZOFRAN -ODT) 4 MG disintegrating tablet Take 4 mg by mouth every 8 (eight) hours as needed. 12/30/22   [provider]  SYMBICORT  160-4.5 MCG/ACT inhaler Inhale 2 puffs into the lungs 2 (two) times daily. 05/23/22   [provider]                                                                                 Vitals:  06/19/24 1330 06/19/24 1345 06/19/24 1425 06/19/24 1445  BP: 97/61 101/68 96/61 117/64  Pulse: 78 75 77 77  Resp:      Temp:      TempSrc:      SpO2: 98% 100% 100% 100%   Physical Exam Vitals reviewed.  Constitutional:      General: She is not in acute distress.    Appearance: She is underweight. She is not ill-appearing.   HENT:     Head: Normocephalic.  Eyes:     Extraocular Movements: Extraocular movements intact.  Cardiovascular:     Rate and Rhythm: Normal rate and regular rhythm.     Pulses: Normal pulses.     Heart sounds: Normal heart sounds.  Pulmonary:     Effort: Pulmonary effort is normal.     Breath sounds: Wheezing and rhonchi present.  Abdominal:     General: There is no distension.     Palpations: Abdomen is soft.     Tenderness: There is no abdominal tenderness.  Musculoskeletal:     Right lower leg: No edema.     Left lower leg: No edema.  Neurological:     General: No focal deficit present.     Mental Status: She is alert and oriented to person, place, and time.     Labs on Admission: I have personally reviewed following labs and imaging studies CBC: Recent Labs  Lab 06/19/24 1125  WBC 16.7*  NEUTROABS 12.7*  HGB 11.4*  HCT 34.8*  MCV 81.3  PLT 628*   Basic Metabolic Panel: Recent Labs  Lab 06/19/24 1125  NA 134*  K 3.5  CL 96*  CO2 24  GLUCOSE 84  BUN 9  CREATININE 0.79  CALCIUM 9.6   GFR: Estimated Creatinine Clearance: 62.1 mL/min (by C-G formula based on SCr of 0.79 mg/dL). Liver Function Tests: Recent Labs  Lab 06/19/24 1125  AST 22  ALT 21  ALKPHOS 89  BILITOT 0.5  PROT 7.8  ALBUMIN 2.4*   No results for input(s): LIPASE, AMYLASE in the last 168 hours. No results for input(s): AMMONIA in the last 168 hours. Recent Labs    10/10/23 1333 10/31/23 1017 11/21/23 0931 01/05/24 1246 01/09/24 1004 01/30/24 1311 03/12/24 0848 04/02/24 0834 06/19/24 1125  BUN 8 8 11   --  9 7 6 14 9   CREATININE 0.74 0.65 0.72 0.70 0.68 0.64 0.55 0.61 0.79    Cardiac Enzymes: No results for input(s): CKTOTAL, CKMB, CKMBINDEX, TROPONINI in the last 168 hours. BNP (last 3 results) No results for input(s): PROBNP in the last 8760 hours. HbA1C: No results for input(s): HGBA1C in the last 72 hours. CBG: No results for input(s): GLUCAP in  the last 168 hours. Lipid Profile: No results for input(s): CHOL, HDL, LDLCALC, TRIG, CHOLHDL, LDLDIRECT in the last 72 hours. Thyroid Function Tests: No results for input(s): TSH, T4TOTAL, FREET4, T3FREE, THYROIDAB in the last 72 hours. Anemia Panel: No results for input(s): VITAMINB12, FOLATE, FERRITIN, TIBC, IRON, RETICCTPCT in the last 72 hours. Urine analysis:    Component Value Date/Time   COLORURINE YELLOW 06/19/2024 1128   APPEARANCEUR CLEAR 06/19/2024 1128   LABSPEC 1.010 06/19/2024 1128   PHURINE 6.0 06/19/2024 1128   GLUCOSEU NEGATIVE 06/19/2024 1128   HGBUR NEGATIVE 06/19/2024 1128   BILIRUBINUR NEGATIVE 06/19/2024 1128   KETONESUR NEGATIVE 06/19/2024 1128   PROTEINUR NEGATIVE 06/19/2024 1128   UROBILINOGEN 0.2 04/21/2007 1141   NITRITE NEGATIVE 06/19/2024 1128   LEUKOCYTESUR NEGATIVE 06/19/2024 1128  Radiological Exams on Admission: CT Chest Wo Contrast Result Date: 06/19/2024 CLINICAL DATA:  Left upper lobe cavitary lesion and hydropneumothorax. EXAM: CT CHEST WITHOUT CONTRAST TECHNIQUE: Multidetector CT imaging of the chest was performed following the standard protocol without IV contrast. RADIATION DOSE REDUCTION: This exam was performed according to the departmental dose-optimization program which includes automated exposure control, adjustment of the mA and/or kV according to patient size and/or use of iterative reconstruction technique. COMPARISON:  Chest radiograph dated 06/19/2024 and CT dated 05/22/2024. FINDINGS: Evaluation of this exam is limited in the absence of intravenous contrast. Cardiovascular: There is no cardiomegaly or pericardial effusion. The thoracic aorta and central pulmonary arteries are grossly unremarkable. Right-sided Port-A-Cath with tip in the central SVC close to the cavoatrial junction. Mediastinum/Nodes: No obvious hilar adenopathy. Cardiopulmonary window lymph nodes measure up to 8 mm. The esophagus is  grossly unremarkable. No mediastinal fluid collection. Lungs/Pleura: Postsurgical changes of the left upper lobectomy. There is bronchiectasis of the left lower lobe. Large cavitary space in the left lung with air-fluid level. Slight interval increase in the size of the fluid content within the cavitation since the prior CT. The upper cavitary component measures approximately 12 x 6 cm in greatest axial dimensions (previously 11 x 5 cm). There is diffuse ground-glass nodular density throughout the lungs, new since the prior CT most consistent with an infectious process, likely atypical infection. The central airways are patent. Upper Abdomen: No acute abnormality. Musculoskeletal: Degenerative changes. No acute osseous pathology. Left axillary surgical clips. IMPRESSION: 1. Postsurgical changes of the left upper lobectomy. 2. Large cavitary space in the left lung with air-fluid level. Slight interval increase of the fluid content within the cavitation since the prior CT. 3. Diffuse ground-glass nodular density throughout the lungs, new since the prior CT most consistent with an infectious process, likely atypical infection. Electronically Signed   By: Vanetta Chou M.D.   On: 06/19/2024 15:01   DG Chest 2 View Result Date: 06/19/2024 EXAM: 2 VIEW(S) XRAY OF THE CHEST 06/19/2024 11:49:00 AM COMPARISON: 06/14/2024 CLINICAL HISTORY: Fatigue FINDINGS: LINES, TUBES AND DEVICES: Right chest Port-A-Cath in place with tip overlying the expected region of the superior cavoatrial junction, stable. LUNGS AND PLEURA: Chronic volume loss in the left hemithorax. Cavitary lesion in the left upper lobe with associated loculated hydropneumothorax. Elevated left hemidiaphragm. HEART AND MEDIASTINUM: No acute abnormality of the cardiac and mediastinal silhouettes. BONES AND SOFT TISSUES: Left axillary surgical clips noted. No acute osseous abnormality. IMPRESSION: 1. Cavitary lesion in the left upper lobe with associated  loculated hydropneumothorax. 2. Chronic volume loss in the left hemithorax and elevated left hemidiaphragm. Electronically signed by: Evalene Coho MD 06/19/2024 12:08 PM EST RP Workstation: [id number]   Data Reviewed: Relevant notes from primary care and specialist visits, past discharge summaries as available in EHR, including Care Everywhere . Prior diagnostic testing as pertinent to current admission diagnoses, Updated medications and problem lists for reconciliation .ED course, including vitals, labs, imaging, treatment and response to treatment,Triage notes, nursing and pharmacy notes and ED provider's notes.Notable results as noted in HPI.Discussed case with EDMD/ ED APP/ or Specialty MD on call and as needed.  Assessment & Plan  58 year old with history of breast cancer and intracranial mets status post recent bronchoscopy in June 14, 2024 for ongoing cough.  Chart review shows patient was seen for increasing in cough and left-sided chest pain that was pleuritic in nature and imaging showed enlargement of chronic cavitary versus severely dilated bronchiectasis with air-fluid level.  >>  Generalized weakness and fatigue: Secondary to decreased p.o. intake and combination of possible infection and pneumonia.  >>Large left lung cavitary space with air fluid level: D/d include PNA/ abscess/ fungal infection. Cont pulse oximetry.  We will cont broad spectrum iv abx and follow.  Initially started on Vanco cefepime  and azithromycin  will change to Levaquin  and cefepime  , or per ID recommendations.  Appreciate pulmonology and infectious disease management and consult.   >> Tobacco abuse/ COPD: Pt declined nicotine  patch. d/w her about tobacco cessation.    >>Metastatic breast cancer: Cont Decadron  and Keppra .  Seizure precautions.   >>GERD: IV Pepcid .aspiration precaution.   >>Protein calorie malnutrition: Nutrition consult.    DVT prophylaxis:  Scd's.   Consults:   Pulmonology.  Infectious disease.   Advance Care Planning:    Code Status: Full Code   Family Communication:  Spouse.  Disposition Plan:  Home.  Severity of Illness: The appropriate patient status for this patient is OBSERVATION. Observation status is judged to be reasonable and necessary in order to provide the required intensity of service to ensure the patient's safety. The patient's presenting symptoms, physical exam findings, and initial radiographic and laboratory data in the context of their medical condition is felt to place them at decreased risk for further clinical deterioration. Furthermore, it is anticipated that the patient will be medically stable for discharge from the hospital within 2 midnights of admission.   Unresulted Labs (From admission, onward)     Start     Ordered   06/20/24 0500  Comprehensive metabolic panel  Tomorrow morning,   R        06/19/24 1530   06/20/24 0500  CBC  Tomorrow morning,   R        06/19/24 1530   06/19/24 1629  Magnesium  Add-on,   AD        06/19/24 1628   06/19/24 1529  Type and screen  Once,   R        06/19/24 1530   06/19/24 1528  HIV Antibody (routine testing w rflx)  (HIV Antibody (Routine testing w reflex) panel)  Once,   R        06/19/24 1530   06/19/24 1230  MRSA Next Gen by PCR, Nasal  Once,   URGENT        06/19/24 1229   06/19/24 1130  Blood Culture (routine x 2)  (Septic presentation on arrival (screening labs, nursing and treatment orders for obvious sepsis))  BLOOD CULTURE X 2,   STAT      06/19/24 1130            Meds ordered this encounter  Medications   lactated ringers  infusion   AND Linked Order Group    lactated ringers  bolus 1,000 mL     Enter Patient Weight in Kilograms:   51    lactated ringers  bolus 500 mL     Enter Patient Weight in Kilograms:   51    lactated ringers  bolus 250 mL     Enter Patient Weight in Kilograms:   51   DISCONTD: cefTRIAXone  (ROCEPHIN ) 2 g in sodium chloride  0.9 % 100  mL IVPB    Antibiotic Indication::   CAP   azithromycin  (ZITHROMAX ) 500 mg in sodium chloride  0.9 % 250 mL IVPB    Antibiotic Indication::   CAP   ceFEPIme  (MAXIPIME ) 2 g in sodium chloride  0.9 % 100 mL IVPB    Antibiotic Indication::   HCAP  vancomycin  (VANCOREADY) IVPB 1250 mg/250 mL    Indication::   Pneumonia   sodium chloride  flush (NS) 0.9 % injection 10-40 mL   sodium chloride  flush (NS) 0.9 % injection 10-40 mL   Chlorhexidine  Gluconate Cloth 2 % PADS 6 each   albuterol  (PROVENTIL ) (2.5 MG/3ML) 0.083% nebulizer solution 3 mL   dexamethasone  (DECADRON ) tablet 2 mg   escitalopram  (LEXAPRO ) tablet 10 mg   levETIRAcetam  (KEPPRA ) tablet 250 mg   ondansetron  (ZOFRAN -ODT) disintegrating tablet 4 mg   fluticasone  furoate-vilanterol (BREO ELLIPTA ) 100-25 MCG/ACT 1 puff   azithromycin  (ZITHROMAX ) 500 mg in sodium chloride  0.9 % 250 mL IVPB    Antibiotic Indication::   CAP   sodium chloride  flush (NS) 0.9 % injection 3 mL   OR Linked Order Group    acetaminophen  (TYLENOL ) tablet 650 mg    acetaminophen  (TYLENOL ) suppository 650 mg   HYDROcodone -acetaminophen  (NORCO/VICODIN) 5-325 MG per tablet 1 tablet    Refill:  0   famotidine  (PEPCID ) IVPB 20 mg premix   lactated ringers  infusion     Orders Placed This Encounter  Procedures   Resp panel by RT-PCR (RSV, Flu A&B, Covid) Anterior Nasal Swab   Blood Culture (routine x 2)   MRSA Next Gen by PCR, Nasal   DG Chest 2 View   CT Chest Wo Contrast   Comprehensive metabolic panel   CBC with Differential   Urinalysis, w/ Reflex to Culture (Infection Suspected) -Urine, Clean Catch   Protime-INR   HIV Antibody (routine testing w rflx)   Comprehensive metabolic panel   CBC   Magnesium   Diet full liquid Room service appropriate? Yes; Fluid consistency: Thin   Document height and weight   Assess and Document Glasgow Coma Scale   Document vital signs within 1-hour of fluid bolus completion. Notify provider of abnormal vital signs  despite fluid resuscitation.   DO NOT delay antibiotics if unable to obtain blood culture.   Refer to Sidebar Report: Sepsis Sidebar ED/IP   Notify provider for difficulties obtaining IV access.   Insert peripheral IV x 2   Initiate Carrier Fluid Protocol   Swab Process:   Considerations:   If MRSA PCR Screen is Positive:   Refer to Sidebar Report - CHG Cloths Sidebar   Patient Education: - [facility] Daily CHG Bathing   Apply needleless connector (cap)   May draw labs from central line   For first catheter occlusion notify IV team ([facility], [facility]) or provider at all other sites.   May access port-a-cath if present   Maintain IV access   Vital signs   Notify physician (specify)   Mobility Protocol: No Restrictions   Refer to Sidebar Report Mobility Protocol for Adult Inpatient   Initiate Adult Central Line Maintenance and Catheter Clearance Protocol for patients with central line (CVC, PICC, Port, Hemodialysis, Trialysis)   Daily weights   Intake and Output   Initiate CHG Protocol for patients in ICU/SD or any patient with a central line or foley catheter   Do not place and if present remove PureWick   Initiate Oral Care Protocol   Initiate Carrier Fluid Protocol   RN may order General Admission PRN Orders utilizing General Admission PRN medications (through manage orders) for the following patient needs: allergy  symptoms (Claritin ), cold sores (Carmex), cough (Robitussin DM), eye irritation (Liquifilm Tears), hemorrhoids (Tucks), indigestion (Maalox), minor skin irritation (Hydrocortisone  Cream), muscle pain (Ben Gay), nose irritation (saline nasal spray) and sore throat (Chloraseptic spray).   SCDs  Cardiac Monitoring - Continuous Indefinite   Strict intake and output   Full code   Code Sepsis activation.  This occurs automatically when order is signed and prioritizes pharmacy, lab, and radiology services for STAT collections and interventions.  If [facility] downtime, call [facility] [phone]) to  activate Code Sepsis.   Consult to pulmonology   Consult to hospitalist   CeFEPIme  (MAXIPIME ) per pharmacy consult            vancomycin  per pharmacy consult   Consult to Registered Dietitian   Pulse oximetry check with vital signs   Oxygen therapy Mode or (Route): Nasal cannula; Liters Per Minute: 2; Keep O2 saturation between: greater than 92 %   I-Stat Lactic Acid, ED   ED EKG   Type and screen   Routine line care   Place in observation (patient's expected length of stay will be less than 2 midnights)   Aspiration precautions   Fall precautions   Seizure precautions    Author: Mario LULLA Blanch, MD 12 pm- 8 pm. [facility]. 06/19/2024 4:51 PM Please note for any communication after hours contact [facility] Assigned provider on call on Amion.

## 2024-06-19 NOTE — ED Notes (Signed)
 Attempted to access port. Unsuccessful blood return. Port de accessed and IV team consulted. Primary RN aware.

## 2024-06-19 NOTE — Progress Notes (Signed)
 Pharmacy Antibiotic Note  Melissa [name] is a 58 y.o. female for which pharmacy has been consulted for cefepime  and vancomycin  dosing for sepsis. Patient with a history of breast cancer w/ intracranial metastasis. Patient presenting with lethargy.  SCr 0.79 WBC 16.7; LA 1.2; T 97.7; HR 77; RR 17 COVID neg / flu neg  Plan: Azithromycin  per MD Cefepime  2g q8hr  Vancomycin  1250 mg once then 1000 mg q24hr (eAUC 483.9) unless change in renal function Monitor WBC, fever, renal function, cultures De-escalate when able Levels at steady state     Temp (24hrs), Avg:97.7 F (36.5 C), Min:97.7 F (36.5 C), Max:97.7 F (36.5 C)  Recent Labs  Lab 06/19/24 1125 06/19/24 1139  WBC 16.7*  --   CREATININE 0.79  --   LATICACIDVEN  --  3.0*    Estimated Creatinine Clearance: 62.1 mL/min (by C-G formula based on SCr of 0.79 mg/dL).    Allergies  Allergen Reactions   Aspirin Anaphylaxis and Shortness Of Breath    THROAT CLOSES   Protonix  [Pantoprazole ] Nausea Only and Other (See Comments)    Also caused a film in the mouth and caused chest pressure   Doxycycline  Other (See Comments)    Hallucinations, headaches   Promethazine -Codeine  Cough    Worsening cough and insomnia    Sulfamethoxazole-Trimethoprim  Other (See Comments)   Iodinated Contrast Media Rash    Patient allergic to contrast used in radiation oncology for CT simulation    Microbiology results: Pending  Thank you for allowing pharmacy to be a part of this patients care.  Dorn Buttner, PharmD, BCPS 06/19/2024 3:42 PM ED Clinical Pharmacist -  [phone]

## 2024-06-19 NOTE — Progress Notes (Signed)
° °  Brief Progress Note   _____________________________________________________________________________________________________________  Patient Name: Melissa [name] Patient DOB: 05-May-1966 Date: @TODAY @      Data: Reviewed vital signs, labs, and notes.    Action: No action required at this time.     Response:  Bed assigned.  _____________________________________________________________________________________________________________  [facility] RN Expeditor Angeleen Horney S Ahmod Gillespie Please contact us  directly via secure chat (search for [facility]) or by calling us  at [phone] [facility]).

## 2024-06-19 NOTE — ED Notes (Signed)
 RN attempted to access port without success. IV team consult placed.

## 2024-06-19 NOTE — Consult Note (Addendum)
 NAME:  Melissa [name], MRN:  [id number], DOB:  1966-03-14, LOS: 0 ADMISSION DATE:  06/19/2024, CONSULTATION DATE:  12/10 REFERRING MD:  Dr. Simon, CHIEF COMPLAINT:  Lethargy   History of Present Illness:  58 y.o. woman with history of breast cancer with intracranial metastasis status post recent radiation and on a Avastin  therapy as well as background tamoxifen  with history of fungal infections status post left upper lobe lobectomy, concern for more recent aspergillosis on and off azole therapy currently off for some time with stable imaging findings, chronic left-sided bronchiectasis here for acute visit with left-sided chest pain and worsening cough.  Most recent hematology/oncology note reviewed.   Unfortunate, several weeks of increased cough.  Developed left-sided chest pain.  Pleuritic in nature.  CT chest, surveillance for cancer, 05/2024 with enlargement of chronic cavitary versus severely dilated bronchial/bronchiectasis with air-fluid level.  Discussed getting more information, bronchoscopy with BAL to ascertain fungal versus bacterial and help decide treatment moving forward. Underwent bronch 12/5. BAL grew pan sensitive pseudomonas reported out 12/10. 12/10 as well the patient presented to [facility] ED complaining of fever, chills, and lethargy. Xray showed new hydropneumothorax on the left and PCCM was asked to see.   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[facility]), Coccidioidomycosis (11/05/2023), Complication of anesthesia, COPD (chronic obstructive pulmonary disease) (HCC), Dyspnea, GERD (gastroesophageal reflux disease), H/O coccidioidomycosis, Headache(784.0), Hemoptysis (05/08/2023), History of anemia, History of asthma,  History of breast cancer (2014), History of chemotherapy, History of hiatal hernia, radiation therapy (03/25/13-05/06/13), 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   Significant Hospital Events: Including procedures, antibiotic start and stop dates in addition to other pertinent events     Interim History / Subjective:    Objective    Blood pressure (!) 86/57, pulse 89, temperature 97.7 F (36.5 C), temperature source Oral, resp. rate 17, last menstrual period 07/25/2012, SpO2 99%.       No intake or output data in the 24 hours ending 06/19/24 1327 There were no vitals filed for this visit.  Examination: General: frail middle aged female in NAD HENT: [place]/AT, no JVD Lungs: Coarse L base Cardiovascular: RRR, no MRG Abdomen: Soft, non-distended Extremities: Na acute deformity or significant edema.  Neuro: Alert, oriented, non-focal  - PFT's  08/09/2016  FEV1 2.05 (77 % ) ratio 70  p 5 % improvement from saba p dulera  100 x2  prior to study with DLCO  46 % corrects to 55  % for alv volume    Resolved problem list   Assessment and Plan    Cavitary pneumonia: BAL from 12/5 growing pan sensitive pseudomonas Chronic pulmonary aspergillosis:  fungal culture 12/5 negative. Off antifungal therapy since early 2025 History of left upper lobectomy secondary to fungal infections.  Emphysema  - Admit to hospitalist service - Continue IV cefepime . DC vanco and azithromycin  - Consider inhaled tobramycin  - Recommend Infectious disease consult to help guide antibiotic therapy re: duration and transition to oral vs discharge on IV antibiotics.  - Supplemental oxygen as indicated to keep sats > 92% - continue home symbicort  - Will need pulmonary follow up with Dr. Annella after discharge.  - PCCM will follow   Labs  CBC: Recent Labs   Lab 06/19/24 1125  WBC 16.7*  NEUTROABS 12.7*  HGB 11.4*  HCT 34.8*  MCV 81.3  PLT 628*    Basic Metabolic Panel: Recent Labs  Lab 06/19/24 1125  NA 134*  K 3.5  CL 96*  CO2 24  GLUCOSE 84  BUN 9  CREATININE 0.79  CALCIUM 9.6   GFR: Estimated Creatinine Clearance: 62.1 mL/min (by C-G formula based on SCr of 0.79 mg/dL). Recent Labs  Lab 06/19/24 1125 06/19/24 1139  WBC 16.7*  --   LATICACIDVEN  --  3.0*    Liver Function Tests: Recent Labs  Lab 06/19/24 1125  AST 22  ALT 21  ALKPHOS 89  BILITOT 0.5  PROT 7.8  ALBUMIN 2.4*   No results for input(s): LIPASE, AMYLASE in the last 168 hours. No results for input(s): AMMONIA in the last 168 hours.  ABG No results found for: PHART, PCO2ART, PO2ART, HCO3, TCO2, ACIDBASEDEF, O2SAT   Coagulation Profile: Recent Labs  Lab 06/19/24 1126  INR 1.1    Cardiac Enzymes: No results for input(s): CKTOTAL, CKMB, CKMBINDEX, TROPONINI in the last 168 hours.  HbA1C: No results found for: HGBA1C  CBG: No results for input(s): GLUCAP in the last 168 hours.  Review of Systems:   Bolds are positive  Constitutional: weight loss, gain, night sweats, Fevers, chills, fatigue .  HEENT: headaches, Sore throat, sneezing, nasal congestion, post nasal drip, Difficulty swallowing, Tooth/dental problems, visual complaints visual changes, ear ache CV:  chest pain, radiates:,Orthopnea, PND, swelling in lower extremities, dizziness, palpitations, syncope.  GI  heartburn, indigestion, abdominal pain, nausea, vomiting, diarrhea, change in bowel habits, loss of appetite, bloody stools.  Resp: cough, productive: , hemoptysis, dyspnea, chest pain, pleuritic.  Skin: rash or itching or icterus GU: dysuria, change in color of urine, urgency or frequency. flank pain, hematuria  MS: joint pain or swelling. decreased range of motion  Psych: change in mood or affect. depression or anxiety.  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[facility]), Coccidioidomycosis (11/05/2023), Complication of anesthesia, COPD (chronic obstructive pulmonary disease) (HCC), Dyspnea, GERD (gastroesophageal reflux disease), H/O coccidioidomycosis, Headache(784.0), Hemoptysis (05/08/2023), History of anemia, History of asthma, History of breast cancer (2014), History of chemotherapy, History of hiatal hernia, radiation therapy (03/25/13-05/06/13), 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   Surgical History:   Past Surgical History:  Procedure Laterality Date   APPLICATION OF CRANIAL NAVIGATION N/A 08/04/2017   Procedure: APPLICATION OF CRANIAL NAVIGATION;  Surgeon: [name], Morene Hicks, MD;  Location: [facility] OR;  Service: Neurosurgery;  Laterality: N/A;   AXILLARY LYMPH NODE DISSECTION Left 02/08/2013   Procedure: LEFT AXILLARY DISSECTION;  Surgeon: Morene ONEIDA Olives, MD;  Location: [facility];  Service: General;  Laterality: Left;   BREAST CYST EXCISION Right 2006   BREAST LUMPECTOMY Left 2014   BREAST LUMPECTOMY WITH NEEDLE LOCALIZATION AND AXILLARY SENTINEL LYMPH NODE BX Left 12/31/2012   Procedure: NEEDLE LOCALIZATION LEFT BREAST LUMPECTOMY AND LEFT AXILLARY SENTENIAL LYMPH NODE BX;  Surgeon: Morene ONEIDA Olives, MD;  Location: [facility];  Service: General;  Laterality: Left;   BRONCHIAL BRUSHINGS  03/17/2020   Procedure: BRONCHIAL BRUSHINGS;  Surgeon: Brenna Adine CROME, DO;  Location: [facility];  Service: Pulmonary;;    BRONCHIAL WASHINGS  03/17/2020   Procedure: BRONCHIAL WASHINGS;  Surgeon: Brenna Adine CROME, DO;  Location: [facility];  Service: Pulmonary;;   CESAREAN SECTION  1995/1996   CRANIOTOMY Right 08/04/2017   Procedure: Right Frontal craniotomy for resection of tumor with stereotactic navigation;  Surgeon: [name], Morene Hicks, MD;  Location: [facility] OR;  Service: Neurosurgery;  Laterality: Right;  Right Frontal craniotomy for resection of tumor with stereotactic navigation   IR CV LINE INJECTION  02/19/2021   IR CV LINE INJECTION  11/12/2021   IR IMAGING GUIDED PORT INSERTION  05/31/2019   LUNG LOBECTOMY Left 05/1996   upper lobe - due to [facility] Fever   PORT-A-CATH REMOVAL Right 08/02/2013   Procedure: REMOVAL PORT-A-CATH;  Surgeon: Morene ONEIDA Olives, MD;  Location: [facility];  Service: General;  Laterality: Right;   PORTACATH PLACEMENT  07/02/2012   Procedure: INSERTION PORT-A-CATH;  Surgeon: Morene ONEIDA Olives, MD;  Location:  SURGERY CENTER;  Service: General;  Laterality: N/A;  right   VIDEO BRONCHOSCOPY N/A 03/17/2020   Procedure: VIDEO BRONCHOSCOPY;  Surgeon: Brenna Adine CROME, DO;  [facility];  Service: Pulmonary;  Laterality: N/A;   VIDEO BRONCHOSCOPY N/A 06/14/2024   Procedure: VIDEO BRONCHOSCOPY WITHOUT FLUORO;  Surgeon: Kassie Acquanetta Bradley, MD;  [facility];  Service: Pulmonary;  Laterality: N/A;   VIDEO BRONCHOSCOPY WITH ENDOBRONCHIAL ULTRASOUND N/A 05/23/2018   Procedure: VIDEO BRONCHOSCOPY WITH ENDOBRONCHIAL ULTRASOUND;  Surgeon: Brenna Adine CROME, DO;  Location: [facility] OR;  Service: Thoracic;  Laterality: N/A;     Social History:   reports that she has been smoking cigarettes. She has a 11.4 pack-year smoking history. She has never used smokeless tobacco. She reports that she does not drink alcohol and does not use drugs.   Family History:  Her family history includes Asthma in her brother; Breast cancer (age of onset: 63 - 48) in her  cousin; Breast cancer (age of onset: 74) in her mother; Cancer in her cousin and mother; Emphysema in her mother; Heart disease in her mother; Melanoma in her mother.   Allergies Allergies  Allergen Reactions   Aspirin Anaphylaxis and Shortness Of Breath    THROAT CLOSES   Protonix  [Pantoprazole ] Nausea Only and Other (See Comments)    Also caused a film in the mouth and caused chest pressure   Doxycycline  Other (See Comments)    Hallucinations, headaches   Promethazine -Codeine  Cough    Worsening cough and insomnia    Sulfamethoxazole-Trimethoprim  Other (See Comments)   Iodinated Contrast Media Rash    Patient allergic to contrast used in radiation oncology for CT simulation      Home Medications  Prior to Admission medications   Medication Sig Start Date End Date Taking? Authorizing Provider  acetaminophen  (TYLENOL ) 325 MG tablet Take 2 tablets (650 mg total) by mouth every 6 (six) hours as needed for mild pain (or Fever >/= 101). 07/26/21   Pearlean Manus, MD  albuterol  (VENTOLIN  HFA) 108 (90 Base) MCG/ACT inhaler Inhale 2 puffs into the lungs every 6 (six) hours as needed for wheezing or shortness of breath (cough). 07/26/21   Pearlean Manus, MD  chlorpheniramine -HYDROcodone  (TUSSIONEX) 10-8 MG/5ML Take 5 mLs by mouth 2 (two) times daily. 05/27/24   Odean Potts, MD  dexamethasone  (DECADRON ) 2 MG tablet Take 1 tablet (2 mg total) by mouth daily. 03/12/24   Causey,  Lindsey Cornetto, NP  diphenhydrAMINE  (BENADRYL ) 50 MG tablet Take 50 mg of Benadryl  on 05/22/24 at 1:15 pm with the last dose of Prednisone  50 mg . Please call [phone] with any questions. 05/20/24   Karalee Wilkie POUR, MD  escitalopram  (LEXAPRO ) 10 MG tablet Take 10 mg by mouth daily.    [provider]  levalbuterol  (XOPENEX ) 0.63 MG/3ML nebulizer solution Take 3 mLs (0.63 mg total) by nebulization every 4 (four) hours as needed for wheezing or shortness of breath. 01/10/23   Gudena, Vinay, MD   levETIRAcetam  (KEPPRA ) 250 MG tablet Take 1 tablet (250 mg total) by mouth 2 (two) times daily. 05/29/24   Vaslow, Zachary K, MD  lidocaine -prilocaine  (EMLA ) cream APPLY EXTERNALLY TO THE AFFECTED AREA 1 TIME 01/30/23   Gudena, Vinay, MD  Multiple Vitamins-Minerals (EMERGEN-C VITAMIN C PO) Take by mouth.    [provider]  naproxen sodium (ALEVE) 220 MG tablet Take by mouth.    [provider]  ondansetron  (ZOFRAN -ODT) 4 MG disintegrating tablet Take 4 mg by mouth every 8 (eight) hours as needed. 12/30/22   [provider]  SYMBICORT  160-4.5 MCG/ACT inhaler Inhale 2 puffs into the lungs 2 (two) times daily. 05/23/22   [provider]     Critical care time:      Deward Eastern, AGACNP-BC [facility]  See Amion for personal pager PCCM on call pager [phone] until 7pm. Please call Elink 7p-7a. [phone]  06/19/2024 4:26 PM

## 2024-06-19 NOTE — Sepsis Progress Note (Signed)
 Code Sepsis protocol being monitored by eLink.

## 2024-06-19 NOTE — ED Notes (Signed)
 CRITICAL VALUE STICKER  CRITICAL VALUE: Lactic Acid 3  RECEIVER (on-site recipient of call): Kairee Isa, Leeroy Crumbly   DATE & TIME NOTIFIED: 11:47 AM   MD NOTIFIED: Dr. Ula  TIME OF NOTIFICATION: 11:47 AM

## 2024-06-19 NOTE — Hospital Course (Signed)
 SABRA

## 2024-06-20 DIAGNOSIS — F17210 Nicotine dependence, cigarettes, uncomplicated: Secondary | ICD-10-CM | POA: Diagnosis not present

## 2024-06-20 DIAGNOSIS — R531 Weakness: Secondary | ICD-10-CM | POA: Diagnosis not present

## 2024-06-20 DIAGNOSIS — J189 Pneumonia, unspecified organism: Secondary | ICD-10-CM

## 2024-06-20 DIAGNOSIS — D72829 Elevated white blood cell count, unspecified: Secondary | ICD-10-CM

## 2024-06-20 DIAGNOSIS — R918 Other nonspecific abnormal finding of lung field: Secondary | ICD-10-CM

## 2024-06-20 LAB — CBC
HCT: 27.9 % — ABNORMAL LOW (ref 36.0–46.0)
Hemoglobin: 8.8 g/dL — ABNORMAL LOW (ref 12.0–15.0)
MCH: 26.3 pg (ref 26.0–34.0)
MCHC: 31.5 g/dL (ref 30.0–36.0)
MCV: 83.5 fL (ref 80.0–100.0)
Platelets: 547 K/uL — ABNORMAL HIGH (ref 150–400)
RBC: 3.34 MIL/uL — ABNORMAL LOW (ref 3.87–5.11)
RDW: 19.7 % — ABNORMAL HIGH (ref 11.5–15.5)
WBC: 12.2 K/uL — ABNORMAL HIGH (ref 4.0–10.5)
nRBC: 0 % (ref 0.0–0.2)

## 2024-06-20 LAB — LACTIC ACID, PLASMA
Lactic Acid, Venous: 1.2 mmol/L (ref 0.5–1.9)
Lactic Acid, Venous: 1.3 mmol/L (ref 0.5–1.9)

## 2024-06-20 LAB — COMPREHENSIVE METABOLIC PANEL WITH GFR
ALT: 17 U/L (ref 0–44)
AST: 13 U/L — ABNORMAL LOW (ref 15–41)
Albumin: 1.8 g/dL — ABNORMAL LOW (ref 3.5–5.0)
Alkaline Phosphatase: 71 U/L (ref 38–126)
Anion gap: 7 (ref 5–15)
BUN: 8 mg/dL (ref 6–20)
CO2: 24 mmol/L (ref 22–32)
Calcium: 8.6 mg/dL — ABNORMAL LOW (ref 8.9–10.3)
Chloride: 104 mmol/L (ref 98–111)
Creatinine, Ser: 0.55 mg/dL (ref 0.44–1.00)
GFR, Estimated: >60 mL/min (ref >60)
Glucose, Bld: 112 mg/dL — ABNORMAL HIGH (ref 70–99)
Potassium: 4.2 mmol/L (ref 3.5–5.1)
Sodium: 135 mmol/L (ref 135–145)
Total Bilirubin: 0.5 mg/dL (ref 0.0–1.2)
Total Protein: 6.2 g/dL — ABNORMAL LOW (ref 6.5–8.1)

## 2024-06-20 MED ORDER — HYDROCOD POLI-CHLORPHE POLI ER 10-8 MG/5ML PO SUER
5.0000 mL | Freq: Two times a day (BID) | ORAL | Status: DC
  Administered 2024-06-20 – 2024-06-22 (×5): 5 mL via ORAL
  Filled 2024-06-20 (×5): qty 5

## 2024-06-20 MED ORDER — SODIUM CHLORIDE 0.9 % IV SOLN: INTRAVENOUS | Status: AC

## 2024-06-20 MED ORDER — SODIUM CHLORIDE 0.9 % IV SOLN: INTRAVENOUS | Status: DC

## 2024-06-20 MED ORDER — SODIUM CHLORIDE 0.9 % IV BOLUS
250.0000 mL | Freq: Once | INTRAVENOUS | Status: AC
  Administered 2024-06-20: 250 mL via INTRAVENOUS

## 2024-06-20 MED ORDER — SODIUM CHLORIDE 0.9 % IV BOLUS
1000.0000 mL | Freq: Once | INTRAVENOUS | Status: AC
  Administered 2024-06-20: 1000 mL via INTRAVENOUS

## 2024-06-20 NOTE — TOC Initial Note (Signed)
 Transition of Care [facility]) - Initial/Assessment Note    Patient Details  Name: Melissa [name] MRN: [id number] Date of Birth: 08-31-1965  Transition of [facility]) CM/SW Contact:    Lendia Dais, LCSWA Phone Number: 06/20/2024, 9:09 AM  Clinical Narrative: CSW spoke to pt at bedside and introduced self and role.  Pt is from home w/ fam and emergency contact is spouse Christopher Colomb. Pt gave CSW verbal permission to contact [place].  Pt reports no use of DME, independent w/ ADL's, and drives. Pt reports support system is her family.  Pt mentioned a hx of anxiety/depression when she first received a cancer dx d/t the adjustments made to her lifestyle. Pt reports current prescription of anxiety medication but has not seen OP psych. No reports of SU.  Pt' source of income is disability and reports no SDOH needs.   No current TOC needs. Please place [facility] consult if new needs arise.                 Expected Discharge Plan: Home/Self Care Barriers to Discharge: Continued Medical Work up   Patient Goals and CMS Choice Patient states their goals for this hospitalization and ongoing recovery are:: Making adjustments to their basement and continuing hobbies   Choice offered to / list presented to : NA      Expected Discharge Plan and Services In-house Referral: Clinical Social Work     Living arrangements for the past 2 months: Single Family Home                                      Prior Living Arrangements/Services Living arrangements for the past 2 months: Single Family Home Lives with:: Spouse, Relatives Patient language and need for interpreter reviewed:: Yes Do you feel safe going back to the place where you live?: Yes      Need for Family Participation in Patient Care: Yes (Comment) Care giver support system in place?: Yes (comment) Current home services: DME Criminal Activity/Legal Involvement Pertinent to Current Situation/Hospitalization: No - Comment  as needed  Activities of Daily Living   ADL Screening (condition at time of admission) Independently performs ADLs?: Yes (appropriate for developmental age) Is the patient deaf or have difficulty hearing?: No Does the patient have difficulty seeing, even when wearing glasses/contacts?: No Does the patient have difficulty concentrating, remembering, or making decisions?: No  Permission Sought/Granted Permission sought to share information with : Family Supports Permission granted to share information with : Yes, Verbal Permission Granted  Share Information with NAME: Lawanna Cecere     Permission granted to share info w Relationship: Spouse  Permission granted to share info w Contact Information: [phone]  Emotional Assessment Appearance:: Appears stated age Attitude/Demeanor/Rapport: Ambitious, Engaged Affect (typically observed): Pleasant, Appropriate Orientation: : Oriented to Self, Oriented to Situation, Oriented to Place, Oriented to  Time Alcohol / Substance Use: Not Applicable Psych Involvement: No (comment)  Admission diagnosis:  Generalized weakness [R53.1] Pneumonia of left upper lobe due to infectious organism [J18.9] Hypotension due to hypovolemia [E86.1] Sepsis without acute organ dysfunction, due to unspecified organism [facility]) [A41.9] Patient Active Problem List   Diagnosis Date Noted   Generalized weakness 06/19/2024   Coccidioidomycosis 11/05/2023   Genetic testing 09/15/2023   Hemoptysis 05/08/2023   Chronic pulmonary aspergillosis (HCC) 08/01/2021   Acid-fast bacteria present 08/01/2021   Smoker 05/24/2021   Drug rash 02/22/2021   Gastroesophageal reflux  disease 03/05/2020   Healthcare maintenance 03/05/2020   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   Port-A-Cath in place 08/05/2019   Cancer of left breast metastatic to brain /Initial Ca 1997, Brain Mets 2019 06/10/2019   Mediastinal adenopathy    H/O coccidioidomycosis and  Aspergillosus 05/16/2018   DOE (dyspnea on exertion) 05/03/2018   Diarrhea 04/22/2018   Cavitary pneumonia 03/29/2018   Aortic atherosclerosis 01/22/2018   Emphysema lung (HCC) 01/22/2018   Metastasis to brain (HCC) 08/04/2017   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 (HCC) 07/27/2017   Nicotine  abuse 07/27/2017   Migraines 07/27/2017   Upper airway cough syndrome 08/10/2016   Abnormal echocardiogram 08/07/2013   Chest tightness or pressure 08/07/2013   Hx of radiation therapy    Edema of left lower extremity 11/19/2012   Tachycardia 09/20/2012   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 ---s/p Lumpectomy/ /Initial Ca 1997, Brain Mets 2019 06/08/2012   Obstructive bronchiectasis (HCC) 01/26/2011   Cigarette smoker 01/26/2011   PCP:  [name], MD Pharmacy:   [facility] [phone] - [place], [place] - [address] AT [facility] [address] [place] [address] Phone: [phone] Fax: [phone]     Social Drivers of Health (SDOH) Social History: SDOH Screenings   Food Insecurity: No Food Insecurity (06/19/2024)  Housing: Low Risk (06/19/2024)  Transportation Needs: No Transportation Needs (06/19/2024)  Utilities: Not At Risk (06/19/2024)  Alcohol Screen: Low Risk (01/30/2023)  Depression (PHQ2-9): Low Risk (05/17/2024)  Financial Resource Strain: Low Risk  (01/03/2023)   Received from [facility]  Tobacco Use: High Risk (06/19/2024)   SDOH Interventions:     Readmission Risk Interventions     No data to display

## 2024-06-20 NOTE — Plan of Care (Signed)
°  Problem: Education: Goal: Knowledge of General Education information will improve Description: Including pain rating scale, medication(s)/side effects and non-pharmacologic comfort measures Outcome: Progressing   Problem: Health Behavior/Discharge Planning: Goal: Ability to manage health-related needs will improve Outcome: Progressing   Problem: Clinical Measurements: Goal: Ability to maintain clinical measurements within normal limits will improve Outcome: Progressing   Problem: Clinical Measurements: Goal: Will remain free from infection Outcome: Progressing   Problem: Clinical Measurements: Goal: Diagnostic test results will improve Outcome: Progressing   Problem: Clinical Measurements: Goal: Respiratory complications will improve Outcome: Progressing   Problem: Clinical Measurements: Goal: Cardiovascular complication will be avoided Outcome: Progressing   Problem: Activity: Goal: Risk for activity intolerance will decrease Outcome: Progressing   Problem: Nutrition: Goal: Adequate nutrition will be maintained Outcome: Progressing   Problem: Coping: Goal: Level of anxiety will decrease Outcome: Progressing   Problem: Elimination: Goal: Will not experience complications related to bowel motility Outcome: Progressing Goal: Will not experience complications related to urinary retention Outcome: Progressing   Problem: Pain Managment: Goal: General experience of comfort will improve and/or be controlled Outcome: Progressing   Problem: Safety: Goal: Ability to remain free from injury will improve Outcome: Progressing   Problem: Skin Integrity: Goal: Risk for impaired skin integrity will decrease Outcome: Progressing

## 2024-06-20 NOTE — Consult Note (Signed)
 [facility]                                                                                        Patient Identification: Patient Name: SRINIKA DELONE MRN: [id number] Admit Date: 06/19/2024 10:46 AM Today's Date: 06/20/2024 Reason for consult: Abnormal CT chest findings, Pseudomonas aeruginosa infection Requesting provider: Dr. 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  AFB stain and fungal stain are negative.  AFB culture and fungal culture are pending at this time.  No biopsy was done.  I will order Fungitell as well as Aspergillus antigen and blood.  - Plan to switch to ciprofloxacin  tomorrow if blood cultures 12/10 are negative at 48 hours and follow-up repeat CT outpatient.  qTC 402. Addendum- duration 4 weeks at discharge. EOT 07/19/24 - Needs follow-up with pulmonary - Patient has a follow-up with Dr. Lindia on 07/15/24 - Universal/standard isolation precautions ID will sign off, recall back with questions or concerns  Rest of the management as per the primary team. Please call  with questions or concerns.  Thank you for the consult  __________________________________________________________________________________________________________ HPI and Hospital Course: 58 year old female with prior history as below including including left breast cancer s/p lumpectomy with brain metastasis s/p radiation on maintenance Herceptin  every 3 weeks followed by Dr. 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  She was sleeping for approximately 18 hours a day.  Reports cough is chronic but worsening in the last 2 to 2.5 weeks as well as SOB. Denied chest pain or hemoptysis, nausea, vomiting abdominal pain or diarrhea or GU symptoms.  Reports continue to smoke 1 pack of cigarettes a day.  At ED, soft BP, afebrile Labs remarkable for NA 134, albumin 2.4, WBC elevated to 16.7, hemoglobin 11.4, platelets 628, lactic acid 3.0 Influenza A/influenza B/RSV/SARS-CoV-2 negative HIV nonreactive UA unremarkable for UTI Blood cx 2/2 pending   Started on IV cefepime  as well as vancomycin , IVF with improvement in BP  Recent bronchoscopy on 12/5, WBC 3750, cloudy, neutrophilic. Cx with psA, AFB smear negative, AFB cx  pending, fungal stain negative  CT Chest 1. Postsurgical changes of the left upper lobectomy. 2. Large cavitary space in the left lung with air-fluid level. Slight interval increase of the fluid content within the cavitation since the prior CT. 3. Diffuse ground-glass nodular density throughout the lungs, new since the prior CT most consistent with an infectious process, likely atypical infection.  ROS: General- Denies fever,  reports she is trying to gain weight after losing weight HEENT - Denies headache, blurry vision, neck pain, sinus pain Chest - Denies any chest pain CVS- Denies any dizziness/lightheadedness, syncopal attacks, palpitations Abdomen- Denies any nausea, vomiting, abdominal pain, hematochezia and diarrhea Neuro - Denies any weakness, numbness, tingling sensation Psych - Denies any changes in mood irritability or depressive symptoms GU- Denies any burning, dysuria, hematuria or increased frequency of urination Skin - denies any rashes/lesions MSK - denies any joint pain/swelling or restricted ROM   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)    2019   Cancer, metastatic to liver (HCC)    2021   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)    Coccidioidomycosis 11/05/2023   Complication of anesthesia    bp dropped + desat    COPD (chronic obstructive pulmonary disease) (HCC)    Dyspnea    DOE   GERD (gastroesophageal reflux disease)    H/O coccidioidomycosis    was reason for lung lobectomy   Headache(784.0)    due to eye strain or not eating   Hemoptysis 05/08/2023   History of anemia    no current problem   History of asthma    as a child   History of breast cancer 2014   left   History of chemotherapy    finished 07/17/2013   History of hiatal hernia    AGE 4   Hx of radiation therapy 03/25/13-05/06/13   left breast 5000 cGy/25 sessions, left breast boost 1000 cGy/5 sessions   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   Personal history of chemotherapy    for liver cancer   Personal history of radiation therapy    Pneumonia    LAST FLARE UP 01/2018   Rash 02/22/2021   Runny nose 07/30/2013   clear drainage   Smoker 05/24/2021   Wears dentures    upper   Past  Surgical History:  Procedure Laterality Date   APPLICATION OF CRANIAL NAVIGATION N/A 08/04/2017   Procedure: APPLICATION OF CRANIAL NAVIGATION;  Surgeon: [name], Morene Hicks, MD;  Location: [facility] OR;  Service: Neurosurgery;  Laterality: N/A;   AXILLARY LYMPH NODE DISSECTION Left 02/08/2013   Procedure: LEFT AXILLARY DISSECTION;  Surgeon: Morene ONEIDA Olives, MD;  Location: [facility];  Service: General;  Laterality: Left;   BREAST CYST EXCISION Right 2006   BREAST LUMPECTOMY Left 2014   BREAST LUMPECTOMY WITH NEEDLE LOCALIZATION AND AXILLARY SENTINEL LYMPH NODE BX Left 12/31/2012   Procedure: NEEDLE LOCALIZATION LEFT BREAST LUMPECTOMY AND LEFT AXILLARY SENTENIAL LYMPH NODE BX;  Surgeon: Morene ONEIDA Olives, MD;  Location: [facility];  Service: General;  Laterality: Left;   BRONCHIAL BRUSHINGS  03/17/2020   Procedure: BRONCHIAL BRUSHINGS;  Surgeon: Brenna Adine CROME, DO;  Location: [facility];  Service: Pulmonary;;   BRONCHIAL WASHINGS  03/17/2020   Procedure: BRONCHIAL WASHINGS;  Surgeon: Brenna Adine CROME, DO;  Location: [facility];  Service: Pulmonary;;   CESAREAN SECTION  1995/1996   CRANIOTOMY Right 08/04/2017   Procedure: Right Frontal craniotomy for resection of tumor with stereotactic navigation;  Surgeon: [name], Morene Hicks, MD;  Location: [facility] OR;  Service: Neurosurgery;  Laterality: Right;  Right Frontal craniotomy for resection of tumor with stereotactic navigation   IR CV LINE INJECTION  02/19/2021   IR CV LINE INJECTION  11/12/2021   IR IMAGING GUIDED PORT INSERTION  05/31/2019   LUNG LOBECTOMY Left 05/1996   upper lobe - due to [facility] Fever   PORT-A-CATH REMOVAL Right 08/02/2013   Procedure: REMOVAL PORT-A-CATH;  Surgeon: Morene ONEIDA Olives, MD;  Location: [facility];  Service: General;  Laterality: Right;   PORTACATH PLACEMENT  07/02/2012   Procedure: INSERTION PORT-A-CATH;  Surgeon: Morene ONEIDA Olives, MD;  Location: [facility];  Service: General;  Laterality: N/A;  right   VIDEO BRONCHOSCOPY N/A 03/17/2020   Procedure: VIDEO BRONCHOSCOPY;  Surgeon: Brenna Adine CROME, DO;  Location: [facility];  Service: Pulmonary;  Laterality: N/A;   VIDEO BRONCHOSCOPY N/A 06/14/2024   Procedure: VIDEO BRONCHOSCOPY WITHOUT FLUORO;  Surgeon: Kassie Acquanetta Bradley, MD;  Location: [facility];  Service: Pulmonary;  Laterality: N/A;   VIDEO BRONCHOSCOPY WITH ENDOBRONCHIAL ULTRASOUND N/A 05/23/2018   Procedure: VIDEO BRONCHOSCOPY WITH ENDOBRONCHIAL ULTRASOUND;  Surgeon: Brenna Adine CROME, DO;  Location: [facility];  Service: Thoracic;  Laterality: N/A;   Scheduled Meds:  Chlorhexidine  Gluconate Cloth  6 each Topical Daily   dexamethasone   2 mg Oral Daily   escitalopram   10 mg Oral Daily   fluticasone  furoate-vilanterol  1 puff Inhalation Daily   levETIRAcetam   250 mg Oral BID   sodium chloride  flush  10-40 mL Intracatheter Q12H   sodium chloride  flush  3 mL Intravenous Q12H   Continuous Infusions:  sodium chloride  50 mL/hr at 06/19/24 1854   azithromycin      ceFEPime  (MAXIPIME ) IV 2 g (06/20/24 0524)   vancomycin      PRN Meds:.acetaminophen  **OR** acetaminophen , albuterol , HYDROcodone -acetaminophen , ondansetron , sodium chloride  flush  Allergies[1]  Social History   Socioeconomic History   Marital status: Married    Spouse name: Not on file   Number of children: 2   Years of education: Not on file   Highest education level: Not on file  Occupational History   Occupation: Investment Banker, Corporate  Tobacco Use   Smoking status: Every Day    Current packs/day: 0.30    Average packs/day: 0.3 packs/day for 38.0 years (11.4 ttl pk-yrs)    Types: Cigarettes   Smokeless tobacco: Never   Tobacco comments:    1 pack a day 06/10/2024 KRD  Vaping Use   Vaping status: Never Used  Substance and Sexual Activity   Alcohol use: No   Drug use: No   Sexual activity: Not Currently  Other Topics Concern   Not on file  Social History  Narrative   Not on file   Social Drivers of Health   Tobacco Use: High Risk (06/19/2024)   Patient History    Smoking Tobacco Use: Every Day    Smokeless Tobacco Use: Never    Passive Exposure: Not on file  Financial Resource Strain: Low Risk  (01/03/2023)   Received from [facility]   Overall Financial Resource Strain (CARDIA)    Difficulty of Paying Living Expenses: Not  hard at all  Food Insecurity: No Food Insecurity (06/19/2024)   Epic    Worried About Programme Researcher, Broadcasting/film/video in the Last Year: Never true    Ran Out of Food in the Last Year: Never true  Transportation Needs: No Transportation Needs (06/19/2024)   Epic    Lack of Transportation (Medical): No    Lack of Transportation (Non-Medical): No  Physical Activity: Not on file  Stress: Not on file  Social Connections: Not on file  Intimate Partner Violence: Not At Risk (06/19/2024)   Epic    Fear of Current or Ex-Partner: No    Emotionally Abused: No    Physically Abused: No    Sexually Abused: No  Depression (PHQ2-9): Low Risk (05/17/2024)   Depression (PHQ2-9)    PHQ-2 Score: 0  Alcohol Screen: Low Risk (01/30/2023)   Alcohol Screen    Last Alcohol Screening Score (AUDIT): 0  Housing: Low Risk (06/19/2024)   Epic    Unable to Pay for Housing in the Last Year: No    Number of Times Moved in the Last Year: 0    Homeless in the Last Year: No  Utilities: Not At Risk (06/19/2024)   Epic    Threatened with loss of utilities: No  Health Literacy: Not on file   Family History  Problem Relation Age of Onset   Cancer Mother        breast   Emphysema Mother        was a smoker   Heart disease Mother    Melanoma Mother        dx in her 79s   Breast cancer Mother 52   Asthma Brother    Cancer Cousin        breast   Breast cancer Cousin 20 - 74       mother's maternal cousin    Vitals BP (!) 108/56 (BP Location: Right Arm)   Pulse 63   Temp 98 F (36.7 C)   Resp 17   Wt 50.3 kg   LMP  07/25/2012 Comment: pregnancy waiver form signed 01-18-2018  SpO2 97%   BMI 20.28 kg/m    Physical Exam Constitutional: Adult female sitting in the bed, nontoxic-appearing    Comments: HEENT WNL  Cardiovascular:     Rate and Rhythm: Normal rate     Heart sounds: S1 and S2  Pulmonary:     Effort: Pulmonary effort is normal.     Comments: Bilateral decreased air entry, Rales present  Abdominal:     Palpations: Abdomen is soft.     Tenderness: Nondistended and nontender  Musculoskeletal:        General: No swelling or tenderness in the joints  Skin:    Comments: No rashes, right chest Port-A-Cath site with no signs of infection  Neurological:     General: Awake, alert and oriented, grossly nonfocal  Psychiatric:        Mood and Affect: Mood normal.    Pertinent Microbiology Results for orders placed or performed during the hospital encounter of 06/19/24  Resp panel by RT-PCR (RSV, Flu A&B, Covid) Anterior Nasal Swab     Status: None   Collection Time: 06/19/24 11:18 AM   Specimen: Anterior Nasal Swab  Result Value Ref Range Status   SARS Coronavirus 2 by RT PCR NEGATIVE NEGATIVE Final   Influenza A by PCR NEGATIVE NEGATIVE Final   Influenza B by PCR NEGATIVE NEGATIVE Final    Comment: (NOTE) The Xpert  Xpress SARS-CoV-2/FLU/RSV plus assay is intended as an aid in the diagnosis of influenza from Nasopharyngeal swab specimens and should not be used as a sole basis for treatment. Nasal washings and aspirates are unacceptable for Xpert Xpress SARS-CoV-2/FLU/RSV testing.  Fact Sheet for Patients: bloggercourse.com  Fact Sheet for Healthcare Providers: seriousbroker.it  This test is not yet approved or cleared by the [place]  FDA and has been authorized for detection and/or diagnosis of SARS-CoV-2 by FDA under an Emergency Use Authorization (EUA). This EUA will remain in effect (meaning this test can be used) for  the duration of the COVID-19 declaration under Section 564(b)(1) of the Act, 21 U.S.C. section 360bbb-3(b)(1), unless the authorization is terminated or revoked.     Resp Syncytial Virus by PCR NEGATIVE NEGATIVE Final    Comment: (NOTE) Fact Sheet for Patients: bloggercourse.com  Fact Sheet for Healthcare Providers: seriousbroker.it  This test is not yet approved or cleared by the [place]  FDA and has been authorized for detection and/or diagnosis of SARS-CoV-2 by FDA under an Emergency Use Authorization (EUA). This EUA will remain in effect (meaning this test can be used) for the duration of the COVID-19 declaration under Section 564(b)(1) of the Act, 21 U.S.C. section 360bbb-3(b)(1), unless the authorization is terminated or revoked.  Performed at [facility], [address], [place], [place] [address]   Blood Culture (routine x 2)     Status: None (Preliminary result)   Collection Time: 06/19/24 11:50 AM   Specimen: BLOOD RIGHT ARM  Result Value Ref Range Status   Specimen Description BLOOD RIGHT ARM  Final   Special Requests   Final    BOTTLES DRAWN AEROBIC ONLY Blood Culture results may not be optimal due to an inadequate volume of blood received in culture bottles   Culture   Final    NO GROWTH < 24 HOURS Performed at [facility], [address], [place], [place] [address]    Report Status PENDING  Incomplete  Blood Culture (routine x 2)     Status: None (Preliminary result)   Collection Time: 06/19/24  6:55 PM   Specimen: BLOOD RIGHT HAND  Result Value Ref Range Status   Specimen Description BLOOD RIGHT HAND  Final   Special Requests   Final    BOTTLES DRAWN AEROBIC ONLY Blood Culture results may not be optimal due to an inadequate volume of blood received in culture bottles   Culture   Final    NO GROWTH < 24 HOURS Performed at [facility], [address], [place], [place] [address]     Report Status PENDING  Incomplete   *Note: Due to a large number of results and/or encounters for the requested time period, some results have not been displayed. A complete set of results can be found in Results Review.   Pertinent Lab seen by me:    Latest Ref Rng & Units 06/20/2024    3:44 AM 06/19/2024   11:25 AM 06/03/2024    8:07 AM  CBC  WBC 4.0 - 10.5 K/uL 12.2  16.7  14.8   Hemoglobin 12.0 - 15.0 g/dL 8.8  88.5  89.3   Hematocrit 36.0 - 46.0 % 27.9  34.8  33.2   Platelets 150 - 400 K/uL 547  628  385       Latest Ref Rng & Units 06/20/2024    3:44 AM 06/19/2024   11:25 AM 04/02/2024    8:34 AM  CMP  Glucose 70 - 99 mg/dL 887  84  72   BUN 6 - 20 mg/dL 8  9  14    Creatinine 0.44 - 1.00 mg/dL 9.44  9.20  9.38   Sodium 135 - 145 mmol/L 135  134  137   Potassium 3.5 - 5.1 mmol/L 4.2  3.5  3.7   Chloride 98 - 111 mmol/L 104  96  104   CO2 22 - 32 mmol/L 24  24  29    Calcium 8.9 - 10.3 mg/dL 8.6  9.6  9.3   Total Protein 6.5 - 8.1 g/dL 6.2  7.8  7.0   Total Bilirubin 0.0 - 1.2 mg/dL 0.5  0.5  0.2   Alkaline Phos 38 - 126 U/L 71  89  68   AST 15 - 41 U/L 13  22  11    ALT 0 - 44 U/L 17  21  13       Pertinent Imagings/Other Imagings Plain films and CT images have been personally visualized and interpreted; radiology reports have been reviewed. Decision making incorporated into the Impression / Recommendations.  CT Chest Wo Contrast Result Date: 06/19/2024 CLINICAL DATA:  Left upper lobe cavitary lesion and hydropneumothorax. EXAM: CT CHEST WITHOUT CONTRAST TECHNIQUE: Multidetector CT imaging of the chest was performed following the standard protocol without IV contrast. RADIATION DOSE REDUCTION: This exam was performed according to the departmental dose-optimization program which includes automated exposure control, adjustment of the mA and/or kV according to patient size and/or use of iterative reconstruction technique. COMPARISON:  Chest radiograph dated 06/19/2024 and CT  dated 05/22/2024. FINDINGS: Evaluation of this exam is limited in the absence of intravenous contrast. Cardiovascular: There is no cardiomegaly or pericardial effusion. The thoracic aorta and central pulmonary arteries are grossly unremarkable. Right-sided Port-A-Cath with tip in the central SVC close to the cavoatrial junction. Mediastinum/Nodes: No obvious hilar adenopathy. Cardiopulmonary window lymph nodes measure up to 8 mm. The esophagus is grossly unremarkable. No mediastinal fluid collection. Lungs/Pleura: Postsurgical changes of the left upper lobectomy. There is bronchiectasis of the left lower lobe. Large cavitary space in the left lung with air-fluid level. Slight interval increase in the size of the fluid content within the cavitation since the prior CT. The upper cavitary component measures approximately 12 x 6 cm in greatest axial dimensions (previously 11 x 5 cm). There is diffuse ground-glass nodular density throughout the lungs, new since the prior CT most consistent with an infectious process, likely atypical infection. The central airways are patent. Upper Abdomen: No acute abnormality. Musculoskeletal: Degenerative changes. No acute osseous pathology. Left axillary surgical clips. IMPRESSION: 1. Postsurgical changes of the left upper lobectomy. 2. Large cavitary space in the left lung with air-fluid level. Slight interval increase of the fluid content within the cavitation since the prior CT. 3. Diffuse ground-glass nodular density throughout the lungs, new since the prior CT most consistent with an infectious process, likely atypical infection. Electronically Signed   By: Vanetta Chou M.D.   On: 06/19/2024 15:01   DG Chest 2 View Result Date: 06/19/2024 EXAM: 2 VIEW(S) XRAY OF THE CHEST 06/19/2024 11:49:00 AM COMPARISON: 06/14/2024 CLINICAL HISTORY: Fatigue FINDINGS: LINES, TUBES AND DEVICES: Right chest Port-A-Cath in place with tip overlying the expected region of the superior  cavoatrial junction, stable. LUNGS AND PLEURA: Chronic volume loss in the left hemithorax. Cavitary lesion in the left upper lobe with associated loculated hydropneumothorax. Elevated left hemidiaphragm. HEART AND MEDIASTINUM: No acute abnormality of the cardiac and mediastinal silhouettes. BONES  AND SOFT TISSUES: Left axillary surgical clips noted. No acute osseous abnormality. IMPRESSION: 1. Cavitary lesion in the left upper lobe with associated loculated hydropneumothorax. 2. Chronic volume loss in the left hemithorax and elevated left hemidiaphragm. Electronically signed by: Evalene Coho MD 06/19/2024 12:08 PM EST RP Workstation: [id number]   ECHOCARDIOGRAM COMPLETE Result Date: 06/17/2024    ECHOCARDIOGRAM REPORT   Patient Name:   JEANMARIE MCCOWEN [facility] Date of Exam: 06/17/2024 Medical Rec #:  [id number]     Height:       62.0 in Accession #:    [id number]    Weight:       113.0 lb Date of Birth:  April 11, 1966     BSA:          1.500 m Patient Age:    58 years      BP:           98/58 mmHg Patient Gender: F             HR:           104 bpm. Exam Location:  Outpatient Procedure: 2D Echo and Strain Analysis (Both Spectral and Color Flow Doppler            were utilized during procedure). Indications:    Chemo  History:        Patient has prior history of Echocardiogram examinations.                 Signs/Symptoms:Chemo.  Sonographer:    Norleen Amour Referring Phys: [id number] LINDSEY CORNETTO CAUSEY IMPRESSIONS  1. Left ventricular ejection fraction, by estimation, is 60 to 65%. The left ventricle has normal function. The left ventricle has no regional wall motion abnormalities. Left ventricular diastolic parameters are consistent with Grade I diastolic dysfunction (impaired relaxation). The average left ventricular global longitudinal strain is -24.3 %. The global longitudinal strain is normal.  2. Right ventricular systolic function is normal. The right ventricular size is normal. There is normal pulmonary artery  systolic pressure.  3. The mitral valve is normal in structure. No evidence of mitral valve regurgitation. No evidence of mitral stenosis.  4. The aortic valve is tricuspid. Aortic valve regurgitation is not visualized. No aortic stenosis is present.  5. The inferior vena cava is normal in size with greater than 50% respiratory variability, suggesting right atrial pressure of 3 mmHg. FINDINGS  Left Ventricle: Left ventricular ejection fraction, by estimation, is 60 to 65%. The left ventricle has normal function. The left ventricle has no regional wall motion abnormalities. The average left ventricular global longitudinal strain is -24.3 %. Strain was performed and the global longitudinal strain is normal. The left ventricular internal cavity size was normal in size. There is no left ventricular hypertrophy. Left ventricular diastolic parameters are consistent with Grade I diastolic dysfunction (impaired relaxation). Normal left ventricular filling pressure. Right Ventricle: The right ventricular size is normal. No increase in right ventricular wall thickness. Right ventricular systolic function is normal. There is normal pulmonary artery systolic pressure. The tricuspid regurgitant velocity is 2.43 m/s, and  with an assumed right atrial pressure of 3 mmHg, the estimated right ventricular systolic pressure is 26.6 mmHg. Left Atrium: Left atrial size was normal in size. Right Atrium: Right atrial size was normal in size. Pericardium: There is no evidence of pericardial effusion. Mitral Valve: The mitral valve is normal in structure. No evidence of mitral valve regurgitation. No evidence of mitral valve stenosis. MV peak gradient, 1.5 mmHg. The mean mitral  valve gradient is 1.0 mmHg. Tricuspid Valve: The tricuspid valve is normal in structure. Tricuspid valve regurgitation is trivial. No evidence of tricuspid stenosis. Aortic Valve: The aortic valve is tricuspid. Aortic valve regurgitation is not visualized. No aortic  stenosis is present. Aortic valve mean gradient measures 3.0 mmHg. Aortic valve peak gradient measures 5.0 mmHg. Aortic valve area, by VTI measures 2.13 cm. Pulmonic Valve: The pulmonic valve was normal in structure. Pulmonic valve regurgitation is not visualized. No evidence of pulmonic stenosis. Aorta: The aortic root is normal in size and structure. Venous: The inferior vena cava is normal in size with greater than 50% respiratory variability, suggesting right atrial pressure of 3 mmHg. IAS/Shunts: No atrial level shunt detected by color flow Doppler.  LEFT VENTRICLE PLAX 2D LVIDd:         3.90 cm     Diastology LVIDs:         2.70 cm     LV e' medial:    9.03 cm/s LV PW:         0.80 cm     LV E/e' medial:  5.5 LV IVS:        0.70 cm     LV e' lateral:   8.81 cm/s LVOT diam:     1.80 cm     LV E/e' lateral: 5.7 LV SV:         43 LV SV Index:   29          2D Longitudinal Strain LVOT Area:     2.54 cm    2D Strain GLS (A4C):   -22.3 %                            2D Strain GLS (A3C):   -30.8 %                            2D Strain GLS (A2C):   -19.8 % LV Volumes (MOD)           2D Strain GLS Avg:     -24.3 % LV vol d, MOD A2C: 36.5 ml LV vol d, MOD A4C: 37.7 ml LV vol s, MOD A2C: 11.8 ml LV vol s, MOD A4C: 11.1 ml LV SV MOD A2C:     24.7 ml LV SV MOD A4C:     37.7 ml LV SV MOD BP:      25.5 ml RIGHT VENTRICLE RV Basal diam:  2.90 cm     PULMONARY VEINS RV S prime:     10.80 cm/s  Diastolic Velocity: 50.10 cm/s TAPSE (M-mode): 1.5 cm      S/D Velocity:       1.40                             Systolic Velocity:  71.10 cm/s LEFT ATRIUM             Index        RIGHT ATRIUM          Index LA diam:        2.70 cm 1.80 cm/m   RA Area:     8.82 cm LA Vol (A2C):   27.0 ml 18.00 ml/m  RA Volume:   18.30 ml 12.20 ml/m LA Vol (A4C):   18.3 ml 12.20 ml/m LA Biplane Vol: 22.6 ml 15.07 ml/m  AORTIC VALVE                    PULMONIC VALVE AV Area (Vmax):    2.48 cm     PV Vmax:       0.99 m/s AV Area (Vmean):   2.17 cm      PV Peak grad:  3.9 mmHg AV Area (VTI):     2.13 cm AV Vmax:           112.00 cm/s AV Vmean:          81.700 cm/s AV VTI:            0.202 m AV Peak Grad:      5.0 mmHg AV Mean Grad:      3.0 mmHg LVOT Vmax:         109.00 cm/s LVOT Vmean:        69.700 cm/s LVOT VTI:          0.169 m LVOT/AV VTI ratio: 0.84  AORTA Ao Root diam: 2.90 cm Ao Asc diam:  2.60 cm MITRAL VALVE               TRICUSPID VALVE MV Area (PHT): 4.19 cm    TR Peak grad:   23.6 mmHg MV Area VTI:   3.73 cm    TR Vmax:        243.00 cm/s MV Peak grad:  1.5 mmHg MV Mean grad:  1.0 mmHg    SHUNTS MV Vmax:       0.62 m/s    Systemic VTI:  0.17 m MV Vmean:      41.8 cm/s   Systemic Diam: 1.80 cm MV Decel Time: 181 msec MV E velocity: 50.10 cm/s MV A velocity: 75.40 cm/s MV E/A ratio:  0.66 Annabella Scarce MD Electronically signed by Annabella Scarce MD Signature Date/Time: 06/17/2024/3:48:03 PM    Final    DG CHEST PORT 1 VIEW Result Date: 06/14/2024 EXAM: 1 VIEW(S) XRAY OF THE CHEST 06/14/2024 11:42:00 AM COMPARISON: 05/22/2024 and 07/25/2021. CLINICAL HISTORY: Shortness of breath. FINDINGS: LINES, TUBES AND DEVICES: Right internal carotid artery catheter is noted. LUNGS AND PLEURA: Elevated left hemidiaphragm is noted. Status post left lobectomy. Large cystic areas are seen in the residual left lung suggesting sequela of prior inflammation. Mild left basal atelectasis or inflammation is noted. No pleural effusion. No pneumothorax. HEART AND MEDIASTINUM: Mediastinal shift to the left is noted. No acute abnormality of the cardiac silhouette. BONES AND SOFT TISSUES: No acute osseous abnormality. IMPRESSION: 1. Elevated left hemidiaphragm, status post left lobectomy, with mediastinal shift to the left. 2. Large cystic areas in the residual left lung, compatible with sequelae of prior inflammation. 3. Mild left basilar atelectasis or inflammation. Electronically signed by: Lynwood Seip MD 06/14/2024 12:16 PM EST RP Workstation: [id number]   CT CHEST  ABDOMEN PELVIS W CONTRAST Result Date: 05/24/2024 EXAM: CT CHEST, ABDOMEN AND PELVIS WITH CONTRAST 01/05/2024 03:05:39 PM TECHNIQUE: CT of the chest, abdomen and pelvis was performed with the administration of 80 cc isovue  370 intravenous contrast. Multiplanar reformatted images are provided for review. Automated exposure control, iterative reconstruction, and/or weight based adjustment of the mA/kV was utilized to reduce the radiation dose to as low as reasonably achievable. COMPARISON: None available. CLINICAL HISTORY: Metastatic breast cancer restaging. * Tracking Code: BO * FINDINGS: CHEST: MEDIASTINUM AND LYMPH NODES: Right port-a-cath tip. Heart and pericardium are unremarkable. The central airways are clear. No mediastinal, hilar or axillary lymphadenopathy. LUNGS AND  PLEURA: Emphysema. Left upper lobectomy. Extensive varicoid bronchiectasis in the left lower lobe. Air fluid level persists in the inferior cavitary component which has increased in size, currently 9.7 x 4.2 cm and previously 7.5 x 2.5 cm. Mild airway plugging anteriorly in the right lower lobe, and in the right middle. Stable trace left pleural effusion. No pneumothorax. ABDOMEN AND PELVIS: LIVER: The liver is unremarkable. GALLBLADDER AND BILE DUCTS: Mildly contracted gallbladder. Common bile duct 0.6 cm in diameter, upper normal size, but similar to prior. Periampullary duodenal diverticulum. SPLEEN: No acute abnormality. PANCREAS: No acute abnormality. ADRENAL GLANDS: No acute abnormality. KIDNEYS, URETERS AND BLADDER: No stones in the kidneys or ureters. No hydronephrosis. No perinephric or periureteral stranding. Urinary bladder is unremarkable. GI AND BOWEL: Stomach demonstrates no acute abnormality. Cluster diverticula proximally in the sigmoid colon. There is no bowel obstruction. REPRODUCTIVE ORGANS: No acute abnormality. PERITONEUM AND RETROPERITONEUM: No ascites. No free air. VASCULATURE: Aorta is normal in caliber. Systemic  atherosclerosis is present, including the aorta and iliac arteries. ABDOMINAL AND PELVIS LYMPH NODES: No lymphadenopathy. BONES AND SOFT TISSUES: No acute osseous abnormality. Postoperative findings in the left breast and axilla without a well defined recurrent mass. No focal soft tissue abnormality. IMPRESSION: 1. No evidence of active malignancy metastatic disease. 2. Extensive varicoid bronchiectasis in the left lower lobe with enlarging cavitary component and persistent air-fluid level, now 9.7 x 4.2 cm from 7.5 x 2.5 cm. 3. Other findings include emphysema, atherosclerosis, and mild proximal sigmoid colon diverticulosis. Electronically signed by: Ryan Salvage MD 05/24/2024 11:18 AM EST RP Workstation: [id number]   I spent 84 minutes involved in face-to-face and non-face-to-face activities for this patient on the day of the visit. Professional time spent includes the following activities: Preparing to see the patient (review of tests), Obtaining and reviewing separately obtained history (ED note, H&P, pulmonary note, hospitalist progress note, Dr. Lindia note), Performing a medically appropriate examination and evaluation , Ordering medications/labs, referring and communicating with other health care professionals, Documenting clinical information in the EMR, Independently interpreting results (not separately reported), Communicating results to the patient, Counseling and educating the patient and Care coordination (not separately reported).  Electronically signed by:   Plan d/w requesting provider as well as ID pharm D  Of note, portions of this note may have been created with voice recognition software. While this note has been edited for accuracy, occasional wrong-word or sound-a-like substitutions may have occurred due to the inherent limitations of voice recognition software.   Annalee Orem, MD Infectious Disease Physician [facility] Pager:  [phone]      [1]  Allergies Allergen Reactions   Aspirin Anaphylaxis and Shortness Of Breath    THROAT CLOSES   Protonix  [Pantoprazole ] Nausea Only and Other (See Comments)    Also caused a film in the mouth and caused chest pressure   Doxycycline  Other (See Comments)    Hallucinations, headaches   Promethazine -Codeine  Cough    Worsening cough and insomnia    Sulfamethoxazole-Trimethoprim  Other (See Comments)    unknown   Iodinated Contrast Media Rash    Patient allergic to contrast used in radiation oncology for CT simulation

## 2024-06-20 NOTE — Plan of Care (Signed)
   Problem: Education: Goal: Knowledge of General Education information will improve Description: Including pain rating scale, medication(s)/side effects and non-pharmacologic comfort measures Outcome: Completed/Met

## 2024-06-20 NOTE — Telephone Encounter (Signed)
 FYI patient currently admitted with sepsis

## 2024-06-20 NOTE — Plan of Care (Signed)

## 2024-06-20 NOTE — Care Management Obs Status (Signed)
 MEDICARE OBSERVATION STATUS NOTIFICATION   Patient Details  Name: Melissa [name] MRN: [id number] Date of Birth: 10-May-1966   Medicare Observation Status Notification Given:  Yes  Obs notice signed and copy given.   Aranda Bihm 06/20/2024, 11:29 AM

## 2024-06-20 NOTE — Telephone Encounter (Signed)
 Patient scheduled.

## 2024-06-20 NOTE — Progress Notes (Signed)
 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 Marguerita with Xeloda  Tucatinib  Herceptin -currently on maintenance trastuzumab  every 3 weeks with Port-A-Cath Prior smoker with bronchiectasis Has history of fungal infections after left upper lobe lobectomy-has had chronic pulmonary aspergillosis and has been off Azole therapy since early 2025 11/24 had CT chest showing increased cavitary lesion 7.5 X9 0.7 cm Dr. Annella consulted in the outpatient setting by Dr. Valere 12/5 bronch performed in the outpatient setting 12/10 came back to the emergency room with lethargy hypersomnolence subjective chills-was slightly hypotensive on arrival MAP about 65 given IV fluid boluses started on azithromycin  and cefepime  vancomycin   Sodium 134 potassium 3.5 BUN/creatinine 9/0.7--WBC 16.7 hemoglobin 11.4 platelets 628 INR 1.1  COVID PCR etc. negative UA negative  Blood culture X2 obtained CT chest showed large cavitary lesion air-fluid level diffuse ground glass density throughout lungs Lactic acid 3.0  Pulmonary consulted/ID consulted   Assessment  & Plan :    Cavitary lung lesion left upper lobe Underlying Pseudomonas infection/colonization with sepsis on admission hypotension Continue cefepime  vancomycin  and azithromycin  but likely can narrow to cefepime  alone Continue fluids at 50 cc/H, discontinue in 6 hours Await ID input regarding duration suspect about 1 month of treatment at least as she is on immunosuppressants etc.-follow-up blood cultures from admission Tells me she lives in an area with mold-informed her that this is unlikely to be only fungal may be  more bacteria  Underlying left metastatic breast cancer diagnosed in 1997 on trastuzumab  every 3 weekly, suppressive Decadron  for brain mets Continue Decadron  2 mg daily, Keppra  250 twice daily Inform oncology do not suspect any workup needed at this time   bronchiectasis Still smoker Continue albuterol  every 6 as needed Tussionex reordered continue Symbicort /levalbuterol  as needed  Depression Continue Lexapro  10  Diarrhea Was on liquid diet until just now-has watery stools not on laxatives-low threshold to test for C. difficile pathogen panel-nursing to inform if more stools  Moderate malnutrition BMI 20   Data Reviewed today:  Sodium 135 potassium 4.2 chloride 104 BUN/creatinine 8/0.55 AST/ALT 13/17 WBC 12.2 hemoglobin 8.8 platelet 547   DVT prophylaxis: SCD  Status is: Observation The patient remains OBS appropriate and will d/c before 2 midnights.    Dispo/Global plan: Inpatient   Time 60   Subjective:   Awake coherent feels better than she did earlier No chest pain no fever no nausea ROM intact She is actually able to stand up 2 episodes of diarrhea since yesterday feels hungry can eat a regular meal      Objective + exam Vitals:   06/19/24 1824 06/19/24 2129 06/20/24 0427 06/20/24 0538  BP: (!) 116/57 110/61 (!) 108/56   Pulse: 78 74 63   Resp: 16 17    Temp: 98.2 F (36.8 C) 98.7 F (37.1 C) 98 F (36.7 C)   TempSrc: Oral (P) Oral    SpO2: 99% 96% 97%   Weight:    50.3 kg   Filed Weights   06/20/24 0538  Weight: 50.3 kg     Examination: EOMI NCAT no focal deficits sinus rhythm Port in place right side Hair  loss noted Chest is clear anterolaterally decreased air entry posteriorly but equal lung sounds Power is 5/5 Abdomen soft no rebound  Scheduled Meds:  Chlorhexidine  Gluconate Cloth  6 each Topical Daily   dexamethasone   2 mg Oral Daily   escitalopram   10 mg Oral Daily   fluticasone  furoate-vilanterol  1 puff Inhalation Daily    levETIRAcetam   250 mg Oral BID   sodium chloride  flush  10-40 mL Intracatheter Q12H   sodium chloride  flush  3 mL Intravenous Q12H   Continuous Infusions:  sodium chloride  50 mL/hr at 06/19/24 1854   azithromycin      ceFEPime  (MAXIPIME ) IV 2 g (06/20/24 0524)   vancomycin      acetaminophen  **OR** acetaminophen , albuterol , HYDROcodone -acetaminophen , ondansetron , sodium chloride  flush  Colen Grimes, MD  [facility]

## 2024-06-21 DIAGNOSIS — J439 Emphysema, unspecified: Secondary | ICD-10-CM | POA: Diagnosis present

## 2024-06-21 DIAGNOSIS — Z923 Personal history of irradiation: Secondary | ICD-10-CM | POA: Diagnosis not present

## 2024-06-21 DIAGNOSIS — Z881 Allergy status to other antibiotic agents status: Secondary | ICD-10-CM | POA: Diagnosis not present

## 2024-06-21 DIAGNOSIS — F17210 Nicotine dependence, cigarettes, uncomplicated: Secondary | ICD-10-CM | POA: Diagnosis present

## 2024-06-21 DIAGNOSIS — K219 Gastro-esophageal reflux disease without esophagitis: Secondary | ICD-10-CM | POA: Diagnosis present

## 2024-06-21 DIAGNOSIS — Z7951 Long term (current) use of inhaled steroids: Secondary | ICD-10-CM | POA: Diagnosis not present

## 2024-06-21 DIAGNOSIS — J188 Other pneumonia, unspecified organism: Secondary | ICD-10-CM | POA: Diagnosis present

## 2024-06-21 DIAGNOSIS — Z1152 Encounter for screening for COVID-19: Secondary | ICD-10-CM | POA: Diagnosis not present

## 2024-06-21 DIAGNOSIS — A4152 Sepsis due to Pseudomonas: Secondary | ICD-10-CM | POA: Diagnosis present

## 2024-06-21 DIAGNOSIS — Z6820 Body mass index (BMI) 20.0-20.9, adult: Secondary | ICD-10-CM | POA: Diagnosis not present

## 2024-06-21 DIAGNOSIS — E861 Hypovolemia: Secondary | ICD-10-CM | POA: Diagnosis present

## 2024-06-21 DIAGNOSIS — R531 Weakness: Secondary | ICD-10-CM | POA: Diagnosis present

## 2024-06-21 DIAGNOSIS — Z886 Allergy status to analgesic agent status: Secondary | ICD-10-CM | POA: Diagnosis not present

## 2024-06-21 DIAGNOSIS — D630 Anemia in neoplastic disease: Secondary | ICD-10-CM | POA: Diagnosis present

## 2024-06-21 DIAGNOSIS — Z902 Acquired absence of lung [part of]: Secondary | ICD-10-CM | POA: Diagnosis not present

## 2024-06-21 DIAGNOSIS — E440 Moderate protein-calorie malnutrition: Secondary | ICD-10-CM | POA: Diagnosis present

## 2024-06-21 DIAGNOSIS — J470 Bronchiectasis with acute lower respiratory infection: Secondary | ICD-10-CM | POA: Diagnosis present

## 2024-06-21 DIAGNOSIS — Z91041 Radiographic dye allergy status: Secondary | ICD-10-CM | POA: Diagnosis not present

## 2024-06-21 DIAGNOSIS — Z79899 Other long term (current) drug therapy: Secondary | ICD-10-CM | POA: Diagnosis not present

## 2024-06-21 DIAGNOSIS — C7931 Secondary malignant neoplasm of brain: Secondary | ICD-10-CM | POA: Diagnosis present

## 2024-06-21 DIAGNOSIS — Z8249 Family history of ischemic heart disease and other diseases of the circulatory system: Secondary | ICD-10-CM | POA: Diagnosis not present

## 2024-06-21 DIAGNOSIS — F32A Depression, unspecified: Secondary | ICD-10-CM | POA: Diagnosis present

## 2024-06-21 DIAGNOSIS — Z9221 Personal history of antineoplastic chemotherapy: Secondary | ICD-10-CM | POA: Diagnosis not present

## 2024-06-21 DIAGNOSIS — R197 Diarrhea, unspecified: Secondary | ICD-10-CM | POA: Diagnosis present

## 2024-06-21 DIAGNOSIS — Z853 Personal history of malignant neoplasm of breast: Secondary | ICD-10-CM | POA: Diagnosis not present

## 2024-06-21 LAB — BASIC METABOLIC PANEL WITH GFR
Anion gap: 7 (ref 5–15)
BUN: 6 mg/dL (ref 6–20)
CO2: 23 mmol/L (ref 22–32)
Calcium: 8.5 mg/dL — ABNORMAL LOW (ref 8.9–10.3)
Chloride: 108 mmol/L (ref 98–111)
Creatinine, Ser: 0.71 mg/dL (ref 0.44–1.00)
GFR, Estimated: >60 mL/min (ref >60)
Glucose, Bld: 93 mg/dL (ref 70–99)
Potassium: 3.6 mmol/L (ref 3.5–5.1)
Sodium: 138 mmol/L (ref 135–145)

## 2024-06-21 LAB — CBC WITH DIFFERENTIAL/PLATELET
Abs Immature Granulocytes: 0.19 K/uL — ABNORMAL HIGH (ref 0.00–0.07)
Basophils Absolute: 0 K/uL (ref 0.0–0.1)
Basophils Relative: 0 %
Eosinophils Absolute: 0.1 K/uL (ref 0.0–0.5)
Eosinophils Relative: 1 %
HCT: 24.3 % — ABNORMAL LOW (ref 36.0–46.0)
Hemoglobin: 7.8 g/dL — ABNORMAL LOW (ref 12.0–15.0)
Immature Granulocytes: 2 %
Lymphocytes Relative: 20 %
Lymphs Abs: 2.3 K/uL (ref 0.7–4.0)
MCH: 26.8 pg (ref 26.0–34.0)
MCHC: 32.1 g/dL (ref 30.0–36.0)
MCV: 83.5 fL (ref 80.0–100.0)
Monocytes Absolute: 0.7 K/uL (ref 0.1–1.0)
Monocytes Relative: 6 %
Neutro Abs: 8.2 K/uL — ABNORMAL HIGH (ref 1.7–7.7)
Neutrophils Relative %: 71 %
Platelets: 421 K/uL — ABNORMAL HIGH (ref 150–400)
RBC: 2.91 MIL/uL — ABNORMAL LOW (ref 3.87–5.11)
RDW: 19.9 % — ABNORMAL HIGH (ref 11.5–15.5)
WBC: 11.6 K/uL — ABNORMAL HIGH (ref 4.0–10.5)
nRBC: 0 % (ref 0.0–0.2)

## 2024-06-21 MED ORDER — CIPROFLOXACIN HCL 500 MG PO TABS
750.0000 mg | ORAL_TABLET | Freq: Two times a day (BID) | ORAL | Status: DC
  Administered 2024-06-21 – 2024-06-22 (×2): 750 mg via ORAL
  Filled 2024-06-21 (×2): qty 2

## 2024-06-21 MED ORDER — MIDODRINE HCL 5 MG PO TABS
10.0000 mg | ORAL_TABLET | Freq: Two times a day (BID) | ORAL | Status: DC
  Administered 2024-06-21: 10 mg via ORAL
  Filled 2024-06-21: qty 2

## 2024-06-21 MED ORDER — MIDODRINE HCL 5 MG PO TABS: 5.0000 mg | ORAL_TABLET | Freq: Two times a day (BID) | ORAL | Status: DC

## 2024-06-21 MED ORDER — ENSURE PLUS HIGH PROTEIN PO LIQD
237.0000 mL | Freq: Two times a day (BID) | ORAL | Status: DC
  Administered 2024-06-21: 237 mL via ORAL

## 2024-06-21 MED ORDER — CIPROFLOXACIN HCL 500 MG PO TABS
500.0000 mg | ORAL_TABLET | Freq: Two times a day (BID) | ORAL | Status: DC
  Administered 2024-06-21: 500 mg via ORAL
  Filled 2024-06-21: qty 1

## 2024-06-21 NOTE — Plan of Care (Signed)
  Problem: Health Behavior/Discharge Planning: Goal: Ability to manage health-related needs will improve Outcome: Progressing   Problem: Clinical Measurements: Goal: Ability to maintain clinical measurements within normal limits will improve Outcome: Progressing Goal: Will remain free from infection Outcome: Progressing Goal: Diagnostic test results will improve Outcome: Progressing Goal: Respiratory complications will improve Outcome: Progressing Goal: Cardiovascular complication will be avoided Outcome: Progressing   Problem: Activity: Goal: Risk for activity intolerance will decrease Outcome: Progressing   Problem: Nutrition: Goal: Adequate nutrition will be maintained Outcome: Progressing   Problem: Coping: Goal: Level of anxiety will decrease Outcome: Progressing   Problem: Elimination: Goal: Will not experience complications related to bowel motility Outcome: Progressing Goal: Will not experience complications related to urinary retention Outcome: Progressing   Problem: Pain Managment: Goal: General experience of comfort will improve and/or be controlled Outcome: Progressing   Problem: Safety: Goal: Ability to remain free from injury will improve Outcome: Progressing   Problem: Skin Integrity: Goal: Risk for impaired skin integrity will decrease Outcome: Progressing   Problem: Activity: Goal: Ability to tolerate increased activity will improve Outcome: Progressing   Problem: Clinical Measurements: Goal: Ability to maintain a body temperature in the normal range will improve Outcome: Progressing   Problem: Respiratory: Goal: Ability to maintain adequate ventilation will improve Outcome: Progressing Goal: Ability to maintain a clear airway will improve Outcome: Progressing

## 2024-06-21 NOTE — Progress Notes (Signed)
 Pt not available for Breo inhaler. Will attempt to come back at a later time.

## 2024-06-21 NOTE — Progress Notes (Signed)
 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 Marguerita with Xeloda  Tucatinib  Herceptin -currently on maintenance trastuzumab  every 3 weeks with Port-A-Cath Prior smoker with bronchiectasis Has history of fungal infections after left upper lobe lobectomy-has had chronic pulmonary aspergillosis and has been off Azole therapy since early 2025 11/24 had CT chest showing increased cavitary lesion 7.5 X9 0.7 cm Dr. Annella consulted in the outpatient setting by Dr. Valere 12/5 bronch performed in the outpatient setting 12/10 came back to the emergency room with lethargy hypersomnolence subjective chills-was slightly hypotensive on arrival MAP about 65 given IV fluid boluses started on azithromycin  and cefepime  vancomycin   Sodium 134 potassium 3.5 BUN/creatinine 9/0.7--WBC 16.7 hemoglobin 11.4 platelets 628 INR 1.1  COVID PCR etc. negative UA negative  Blood culture X2 obtained CT chest showed large cavitary lesion air-fluid level diffuse ground glass density throughout lungs Lactic acid 3.0  Pulmonary consulted/ID consulted 12/11 PM became more hypotensive requiring boluses of fluid   Assessment  & Plan :    Hypotension in the setting of cavitary lung lesions Status post multiple boluses of fluid overnight --- lactic acid pretty unremarkable-would still watch close and keep on IV fluid 50 cc/H Start midodrine 10 twice daily  Cavitary lung lesion left upper lobe-previous fungal infections Pseudo Monus sepsis on admission Initially placed on cefepime  vancomycin  azithromycin --ID saw the patient--currently on cefepime  Blood culture 12/10 shows no growth so  can switch to ciprofloxacin  for 4 weeks EOT 07/19/2024 and follow-up with Dr. Lindia 07/15/2024 Follow Fungitell and Aspergillus to ensure no infectious process from a fungal etiology pseudo  Underlying left metastatic breast cancer diagnosed in 1997 on trastuzumab  every 3 weekly, suppressive Decadron  for brain mets Continue Decadron  2 mg daily, Keppra  250 twice daily Inform oncology do not suspect any workup needed at this time   bronchiectasis Still smoker Continue albuterol  every 6 as needed Tussionex ,  Symbicort /levalbuterol  as needed  Likely dilutional anemia She mentions some dark stool but it is more formed than it was I think she has hemodilution predominantly and we will recheck labs in the morning  Depression Continue Lexapro  10  Diarrhea Self resolved with graduation of diet to regular does not need diarrhea testing  Moderate malnutrition BMI 20   Data Reviewed today:  Sodium 138 potassium 3.6 BUN/creatinine 6/0.7 WBC 11.6 hemoglobin 7.8 platelet 421   DVT prophylaxis: SCD  Status is: Observation The patient remains OBS appropriate and will d/c before 2 midnights.    Dispo/Global plan: Inpatient   Time 60   Subjective:   Awake coherent feels bwell No chest pain no fever Asking when she can go home-I have explained to her we still need to monitor because of low blood pressures overnight and ensure she maintains stability  Objective + exam Vitals:   06/20/24 1847 06/20/24 2142 06/21/24 0549 06/21/24 0728  BP: (!) 84/44 112/81 (!) 104/58 95/75  Pulse:  69 61 67  Resp:  18 18 18   Temp:  98.1 F (36.7 C) 97.8 F (36.6 C) 97.9 F (36.6 C)  TempSrc:  Oral Oral Oral  SpO2:  97% 96% 94%  Weight:  Filed Weights   06/20/24 0538  Weight: 50.3 kg     Examination: EOMI NCAT no focal deficits sinus rhythm Port in place right side Chest is clear posteriorly to my exam Abdomen soft no rebound No lower extremity edema  Scheduled Meds:  Chlorhexidine   Gluconate Cloth  6 each Topical Daily   chlorpheniramine -HYDROcodone   5 mL Oral BID   dexamethasone   2 mg Oral Daily   escitalopram   10 mg Oral Daily   fluticasone  furoate-vilanterol  1 puff Inhalation Daily   levETIRAcetam   250 mg Oral BID   sodium chloride  flush  10-40 mL Intracatheter Q12H   sodium chloride  flush  3 mL Intravenous Q12H   Continuous Infusions:  sodium chloride  50 mL/hr at 06/20/24 2022   ceFEPime  (MAXIPIME ) IV 2 g (06/21/24 0528)   acetaminophen  **OR** acetaminophen , albuterol , HYDROcodone -acetaminophen , ondansetron , sodium chloride  flush  Colen Grimes, MD  [facility]

## 2024-06-21 NOTE — Progress Notes (Signed)
 Initial Nutrition Assessment  DOCUMENTATION CODES:  Non-severe (moderate) malnutrition in context of chronic illness  INTERVENTION:  Continue regular diet Encourage PO intake Ensure Plus High Protein po BID, each supplement provides 350 kcal and 20 grams of protein.  NUTRITION DIAGNOSIS:  Moderate Malnutrition related to chronic illness (cancer) as evidenced by moderate fat depletion, severe muscle depletion.  GOAL:  Patient will meet greater than or equal to 90% of their needs  MONITOR:  PO intake, Supplement acceptance, Labs, Weight trends  REASON FOR ASSESSMENT:  Consult Assessment of nutrition requirement/status  ASSESSMENT:  Pt with hx of breast cancer (initial dx 1997) with mets still actively undergoing treatment, COPD, GERD, and prior lobectomy presented to ED with weakness and poor PO after a bronchoscopy a few days prior. Imaging in ED showed a new hydropneumothorax.   Pt resting in bed at the time of assessment. Husband at bedside assists with hx. Pt reports that appetite has been stable since being admitted and husband notes that she is eating a little more now than she is at baseline at home. Pt reports that for several days PTA she was very lethargic and had no energy, was sleeping a lot more.   Normal intake: Breakfast: cereal Lunch: a sandwich or peanut butter toast Dinner: cook at home, i.e. a hamburger or porkchop (meat with sides)    Pt is seen regularly by RD at cancer center. Reports that pt was drinking Mallie Pinion for a long time PTA and doing well but then it started to cause GI distress. Switched to [employer] Peptide with good tolerance but reports she has not had them in about a month. Does note they are expensive.   Does not experience any GI related side-effects with chemo regimen.  Discussed weight hx and noted weight trending up over the last 3 months. Pt reports that she was on a steroid which she has now taken herself off of because it was causing  her to crave sugary items. Pt reports that she was having ice cream or peanut butter cookies as a snack before bed. Did discuss with pt that it seemed like this strategy was working in her favor and also reminded of the nutrients that were being provided in both ice cream and the peanut butter cookies.  Pt really wanting to go home, hopeful that blood pressure will improve so she can leave in the AM. Encouraged good intake of meals and will also add ensure. Pt prefers vanilla. Still on IVF at 50mL/h at this time.   Admit / Current weight: 50.3 kg    Average Meal Intake: 12/11: 50% intake x 1 recorded meals  Nutritionally Relevant Medications: Scheduled Meds:  ciprofloxacin   500 mg Oral BID   dexamethasone   2 mg Oral Daily   levETIRAcetam   250 mg Oral BID   Continuous Infusions:  sodium chloride  50 mL/hr at 06/20/24 2022   Labs Reviewed  NUTRITION - FOCUSED PHYSICAL EXAM: Flowsheet Row Most Recent Value  Orbital Region Moderate depletion  Upper Arm Region Moderate depletion  Thoracic and Lumbar Region Moderate depletion  Buccal Region Moderate depletion  Temple Region Severe depletion  Clavicle Bone Region Severe depletion  Clavicle and Acromion Bone Region Severe depletion  Scapular Bone Region Moderate depletion  Dorsal Hand Mild depletion  Patellar Region Severe depletion  Anterior Thigh Region Severe depletion  Posterior Calf Region Severe depletion  Edema (RD Assessment) None  Hair Reviewed  Eyes Reviewed  Mouth Reviewed  Skin Reviewed  Nails Reviewed  Diet Order:   Diet Order             Diet regular Room service appropriate? Yes; Fluid consistency: Thin  Diet effective now                   EDUCATION NEEDS:  Education needs have been addressed  Skin:  Skin Assessment: Reviewed RN Assessment  Last BM:  06/20/24  Height:  Ht Readings from Last 1 Encounters:  06/14/24 5' 2 (1.575 m)    Weight:  Wt Readings from Last 1 Encounters:  06/20/24  50.3 kg    Ideal Body Weight:  50 kg  BMI:  Body mass index is 20.28 kg/m.  Estimated Nutritional Needs:  Kcal:  1500-1800 kcal/d Protein:  80-100g/d Fluid:  >/=1.8L/d    Vernell Lukes, RD, LDN, CNSC Registered Dietitian II Please reach out via secure chat

## 2024-06-21 NOTE — Telephone Encounter (Signed)
 Copied from CRM [id number]. Topic: Clinical - Prescription Issue >> Jun 18, 2024  4:08 PM Melissa [name] wrote: Reason for CRM: Patient was told shed be prescribed medication/antibiotic after completing bronchoscopy - she has not heard back from anyone.   Callback number: [phone]  Pt is admitted. NFN

## 2024-06-22 LAB — CBC WITH DIFFERENTIAL/PLATELET
Abs Immature Granulocytes: 0.13 K/uL — ABNORMAL HIGH (ref 0.00–0.07)
Basophils Absolute: 0 K/uL (ref 0.0–0.1)
Basophils Relative: 0 %
Eosinophils Absolute: 0.2 K/uL (ref 0.0–0.5)
Eosinophils Relative: 2 %
HCT: 25.4 % — ABNORMAL LOW (ref 36.0–46.0)
Hemoglobin: 8 g/dL — ABNORMAL LOW (ref 12.0–15.0)
Immature Granulocytes: 1 %
Lymphocytes Relative: 22 %
Lymphs Abs: 2.4 K/uL (ref 0.7–4.0)
MCH: 26.4 pg (ref 26.0–34.0)
MCHC: 31.5 g/dL (ref 30.0–36.0)
MCV: 83.8 fL (ref 80.0–100.0)
Monocytes Absolute: 0.9 K/uL (ref 0.1–1.0)
Monocytes Relative: 8 %
Neutro Abs: 7.5 K/uL (ref 1.7–7.7)
Neutrophils Relative %: 67 %
Platelets: 405 K/uL — ABNORMAL HIGH (ref 150–400)
RBC: 3.03 MIL/uL — ABNORMAL LOW (ref 3.87–5.11)
RDW: 19.9 % — ABNORMAL HIGH (ref 11.5–15.5)
WBC: 11 K/uL — ABNORMAL HIGH (ref 4.0–10.5)
nRBC: 0 % (ref 0.0–0.2)

## 2024-06-22 LAB — BASIC METABOLIC PANEL WITH GFR
Anion gap: 7 (ref 5–15)
BUN: 6 mg/dL (ref 6–20)
CO2: 28 mmol/L (ref 22–32)
Calcium: 8.6 mg/dL — ABNORMAL LOW (ref 8.9–10.3)
Chloride: 102 mmol/L (ref 98–111)
Creatinine, Ser: 0.74 mg/dL (ref 0.44–1.00)
GFR, Estimated: >60 mL/min (ref >60)
Glucose, Bld: 82 mg/dL (ref 70–99)
Potassium: 3.8 mmol/L (ref 3.5–5.1)
Sodium: 137 mmol/L (ref 135–145)

## 2024-06-22 MED ORDER — HEPARIN SOD (PORK) LOCK FLUSH 100 UNIT/ML IV SOLN
500.0000 [IU] | INTRAVENOUS | Status: AC | PRN
  Administered 2024-06-22: 500 [IU]

## 2024-06-22 MED ORDER — CIPROFLOXACIN HCL 750 MG PO TABS: 750.0000 mg | ORAL_TABLET | Freq: Two times a day (BID) | ORAL | 0 refills | Status: AC

## 2024-06-22 NOTE — Plan of Care (Signed)
   Problem: Clinical Measurements: Goal: Respiratory complications will improve Outcome: Progressing   Problem: Activity: Goal: Risk for activity intolerance will decrease Outcome: Progressing

## 2024-06-22 NOTE — Discharge Summary (Signed)
 Physician Discharge Summary  Melissa [name] FMW:[id number] DOB: [birth year] DOA: 06/19/2024  PCP: [name], MD  Admit date: 06/19/2024 Discharge date: 06/22/2024  Time spent: 20 minutes  Recommendations for Outpatient Follow-up:  Chem-12 CBC outpatient 1 week Outpatient follow-up with Dr. Lindia 07/15/2024 as below complete ciprofloxacin  called into her pharmacy for a duration of 1 month ending 07/19/2024 and follow-up Fungitell and Aspergillus as an outpatient Follow-up with oncologist Dr. 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 Marguerita with Xeloda  Tucatinib  Herceptin -currently on maintenance trastuzumab  every 3 weeks with Port-A-Cath Prior smoker with bronchiectasis Has history of fungal infections after left upper lobe lobectomy-has had chronic pulmonary aspergillosis and has been off Azole therapy since early 2025 11/24 had CT chest showing increased cavitary lesion 7.5 X9 0.7 cm Dr. Annella consulted in the outpatient setting by Dr. Valere 12/5 bronch performed in the outpatient setting 12/10 came back to the emergency room with lethargy hypersomnolence subjective chills-was slightly hypotensive on arrival MAP about 65 given IV fluid boluses started on azithromycin  and cefepime  vancomycin              Sodium 134  potassium 3.5 BUN/creatinine 9/0.7--WBC 16.7 hemoglobin 11.4 platelets 628 INR 1.1             COVID PCR etc. negative UA negative             Blood culture X2 obtained CT chest showed large cavitary lesion air-fluid level diffuse ground glass density throughout lungs Lactic acid 3.0   Pulmonary consulted/ID consulted 12/11 PM became more hypotensive requiring boluses of fluid    Assessment  & Plan :      Hypotension in the setting of cavitary lung lesions Status post multiple boluses of fluid overnight --- lactic acid pretty unremarkable-observed over the 24 to 48 hours and was placed transiently on midodrine  Patient quite insistent on going home asymptomatic-I think this is okay she will need close follow-up in the outpatient setting   Cavitary lung lesion left upper lobe-previous fungal infections Pseudomonas sepsis on admission Initially placed on cefepime  vancomycin  azithromycin --ID saw the patient--was narrowed to cefepime  and then ciprofloxacin  with EOT of 07/19/2024 and close follow-up with Dr. Lindia in the outpatient setting 07/15/2024 given previous fungal lung infections--has been cautioned about infectious signs symptoms and knows to follow-up Blood culture 12/10 shows no growth  Follow Fungitell and Aspergillus as an outpatient and will CC Dr. 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 She mentions some dark stool but it is more formed than it was I think she has hemodilution --- loose stools and dark consistency were likely in  the setting of multiple supplements that she was given-her hemoglobin has stayed stable in the 8-9 range and she was probably little hemoconcentrated when she came in Nonetheless would recommend iron studies and workup of this in the outpatient setting   Depression Continue Lexapro  10   Diarrhea Self resolved with graduation of diet to regular does not need diarrhea testing   Moderate malnutrition BMI 20   Discharge Exam: Vitals:   06/21/24 1913 06/22/24 0542  BP: (!) 120/52 98/60  Pulse: (!) 52 63  Resp: 18 18  Temp: 98.1 F (36.7 C) 98.3 F (36.8 C)  SpO2: 94% 95%    Subj on day of d/c   Well no new issues ambulatory  General Exam on discharge  No icterus no pallor Chest is clear Abdomen soft no rebound S1-S2 no murmur  Discharge Instructions   Discharge Instructions     Discharge instructions   Complete by: As directed    Recommend close outpatient follow-up with infectious disease given your history of multiple prior infections-the fungal cultures are still pending which may need to be followed up in the outpatient setting ---given your improvements overall however I do not think that this is of large concern Continue all your other meds Complete the full course of ciprofloxacin  and follow-up with infectious disease in the outpatient setting High-grade fevers chills dizziness or weakness requires that you come back to the emergency room   Increase activity slowly   Complete by: As directed       Allergies as of 06/22/2024       Reactions   Aspirin Anaphylaxis, Shortness Of Breath   THROAT CLOSES   Protonix  [pantoprazole ] Nausea Only, Other (See Comments)   Also caused a film in the mouth and caused chest pressure   Doxycycline  Other (See Comments)   Hallucinations, headaches   Promethazine -codeine  Cough   Worsening cough and insomnia    Sulfamethoxazole-trimethoprim  Other (See Comments)   unknown   Iodinated Contrast Media Rash   Patient allergic  to contrast used in radiation oncology for CT simulation        Medication List     TAKE these medications    acetaminophen  325 MG tablet Commonly known as: TYLENOL  Take 2 tablets (650 mg total) by mouth every 6 (six) hours as needed for mild pain (or Fever >/= 101).   albuterol  108 (90 Base) MCG/ACT inhaler Commonly known as: Ventolin  HFA Inhale 2 puffs into the lungs every 6 (six) hours as needed for wheezing or shortness of breath (cough).   chlorpheniramine -HYDROcodone  10-8 MG/5ML Commonly known as: TUSSIONEX Take 5 mLs by mouth 2 (two) times daily.   ciprofloxacin  750 MG tablet Commonly known as: CIPRO  Take 1 tablet (750 mg total) by mouth 2 (two) times daily for 57 doses.   dexamethasone  2 MG tablet Commonly known as: DECADRON  Take 1 tablet (2 mg total) by mouth daily.   diphenhydrAMINE  50 MG tablet Commonly known as: BENADRYL  Take 50 mg of Benadryl  on 05/22/24 at 1:15 pm with the last dose of Prednisone  50 mg . Please call [phone] with any questions. What changed:  how much to take how to take this when to take this reasons to take this   EMERGEN-C VITAMIN C PO Take 1 tablet by mouth daily.   escitalopram  10 MG tablet  Commonly known as: LEXAPRO  Take 10 mg by mouth daily.   levalbuterol  0.63 MG/3ML nebulizer solution Commonly known as: XOPENEX  Take 3 mLs (0.63 mg total) by nebulization every 4 (four) hours as needed for wheezing or shortness of breath.   levETIRAcetam  250 MG tablet Commonly known as: Keppra  Take 1 tablet (250 mg total) by mouth 2 (two) times daily.   lidocaine -prilocaine  cream Commonly known as: EMLA  APPLY EXTERNALLY TO THE AFFECTED AREA 1 TIME What changed:  how much to take how to take this when to take this reasons to take this additional instructions   naproxen sodium 220 MG tablet Commonly known as: ALEVE Take 220 mg by mouth daily as needed (for pain).   ondansetron  4 MG disintegrating tablet Commonly known as:  ZOFRAN -ODT Take 4 mg by mouth every 8 (eight) hours as needed for vomiting or nausea.   Symbicort  160-4.5 MCG/ACT inhaler Generic drug: budesonide -formoterol  Inhale 2 puffs into the lungs 2 (two) times daily.       Allergies[1]  Follow-up Information     [name], MD Follow up.   Specialty: Internal Medicine Contact information: [address] [place] [address] [phone]                  The results of significant diagnostics from this hospitalization (including imaging, microbiology, ancillary and laboratory) are listed below for reference.    Significant Diagnostic Studies: CT Chest Wo Contrast Result Date: 06/19/2024 CLINICAL DATA:  Left upper lobe cavitary lesion and hydropneumothorax. EXAM: CT CHEST WITHOUT CONTRAST TECHNIQUE: Multidetector CT imaging of the chest was performed following the standard protocol without IV contrast. RADIATION DOSE REDUCTION: This exam was performed according to the departmental dose-optimization program which includes automated exposure control, adjustment of the mA and/or kV according to patient size and/or use of iterative reconstruction technique. COMPARISON:  Chest radiograph dated 06/19/2024 and CT dated 05/22/2024. FINDINGS: Evaluation of this exam is limited in the absence of intravenous contrast. Cardiovascular: There is no cardiomegaly or pericardial effusion. The thoracic aorta and central pulmonary arteries are grossly unremarkable. Right-sided Port-A-Cath with tip in the central SVC close to the cavoatrial junction. Mediastinum/Nodes: No obvious hilar adenopathy. Cardiopulmonary window lymph nodes measure up to 8 mm. The esophagus is grossly unremarkable. No mediastinal fluid collection. Lungs/Pleura: Postsurgical changes of the left upper lobectomy. There is bronchiectasis of the left lower lobe. Large cavitary space in the left lung with air-fluid level. Slight interval increase in the size of the fluid  content within the cavitation since the prior CT. The upper cavitary component measures approximately 12 x 6 cm in greatest axial dimensions (previously 11 x 5 cm). There is diffuse ground-glass nodular density throughout the lungs, new since the prior CT most consistent with an infectious process, likely atypical infection. The central airways are patent. Upper Abdomen: No acute abnormality. Musculoskeletal: Degenerative changes. No acute osseous pathology. Left axillary surgical clips. IMPRESSION: 1. Postsurgical changes of the left upper lobectomy. 2. Large cavitary space in the left lung with air-fluid level. Slight interval increase of the fluid content within the cavitation since the prior CT. 3. Diffuse ground-glass nodular density throughout the lungs, new since the prior CT most consistent with an infectious process, likely atypical infection. Electronically Signed   By: Vanetta Chou M.D.   On: 06/19/2024 15:01   DG Chest 2 View Result Date: 06/19/2024 EXAM: 2 VIEW(S) XRAY OF THE CHEST 06/19/2024 11:49:00 AM COMPARISON: 06/14/2024 CLINICAL HISTORY: Fatigue FINDINGS: LINES, TUBES AND  DEVICES: Right chest Port-A-Cath in place with tip overlying the expected region of the superior cavoatrial junction, stable. LUNGS AND PLEURA: Chronic volume loss in the left hemithorax. Cavitary lesion in the left upper lobe with associated loculated hydropneumothorax. Elevated left hemidiaphragm. HEART AND MEDIASTINUM: No acute abnormality of the cardiac and mediastinal silhouettes. BONES AND SOFT TISSUES: Left axillary surgical clips noted. No acute osseous abnormality. IMPRESSION: 1. Cavitary lesion in the left upper lobe with associated loculated hydropneumothorax. 2. Chronic volume loss in the left hemithorax and elevated left hemidiaphragm. Electronically signed by: Evalene Coho MD 06/19/2024 12:08 PM EST RP Workstation: [id number]   ECHOCARDIOGRAM COMPLETE Result Date: 06/17/2024    ECHOCARDIOGRAM REPORT    Patient Name:   FOYE HAGGART [facility] Date of Exam: 06/17/2024 Medical Rec #:  [id number]     Height:       62.0 in Accession #:    [id number]    Weight:       113.0 lb Date of Birth:  12-15-1965     BSA:          1.500 m Patient Age:    58 years      BP:           98/58 mmHg Patient Gender: F             HR:           104 bpm. Exam Location:  Outpatient Procedure: 2D Echo and Strain Analysis (Both Spectral and Color Flow Doppler            were utilized during procedure). Indications:    Chemo  History:        Patient has prior history of Echocardiogram examinations.                 Signs/Symptoms:Chemo.  Sonographer:    Norleen Amour Referring Phys: 61 LINDSEY CORNETTO CAUSEY IMPRESSIONS  1. Left ventricular ejection fraction, by estimation, is 60 to 65%. The left ventricle has normal function. The left ventricle has no regional wall motion abnormalities. Left ventricular diastolic parameters are consistent with Grade I diastolic dysfunction (impaired relaxation). The average left ventricular global longitudinal strain is -24.3 %. The global longitudinal strain is normal.  2. Right ventricular systolic function is normal. The right ventricular size is normal. There is normal pulmonary artery systolic pressure.  3. The mitral valve is normal in structure. No evidence of mitral valve regurgitation. No evidence of mitral stenosis.  4. The aortic valve is tricuspid. Aortic valve regurgitation is not visualized. No aortic stenosis is present.  5. The inferior vena cava is normal in size with greater than 50% respiratory variability, suggesting right atrial pressure of 3 mmHg. FINDINGS  Left Ventricle: Left ventricular ejection fraction, by estimation, is 60 to 65%. The left ventricle has normal function. The left ventricle has no regional wall motion abnormalities. The average left ventricular global longitudinal strain is -24.3 %. Strain was performed and the global longitudinal strain is normal. The left ventricular  internal cavity size was normal in size. There is no left ventricular hypertrophy. Left ventricular diastolic parameters are consistent with Grade I diastolic dysfunction (impaired relaxation). Normal left ventricular filling pressure. Right Ventricle: The right ventricular size is normal. No increase in right ventricular wall thickness. Right ventricular systolic function is normal. There is normal pulmonary artery systolic pressure. The tricuspid regurgitant velocity is 2.43 m/s, and  with an assumed right atrial pressure of 3 mmHg, the estimated right ventricular systolic pressure is 26.6  mmHg. Left Atrium: Left atrial size was normal in size. Right Atrium: Right atrial size was normal in size. Pericardium: There is no evidence of pericardial effusion. Mitral Valve: The mitral valve is normal in structure. No evidence of mitral valve regurgitation. No evidence of mitral valve stenosis. MV peak gradient, 1.5 mmHg. The mean mitral valve gradient is 1.0 mmHg. Tricuspid Valve: The tricuspid valve is normal in structure. Tricuspid valve regurgitation is trivial. No evidence of tricuspid stenosis. Aortic Valve: The aortic valve is tricuspid. Aortic valve regurgitation is not visualized. No aortic stenosis is present. Aortic valve mean gradient measures 3.0 mmHg. Aortic valve peak gradient measures 5.0 mmHg. Aortic valve area, by VTI measures 2.13 cm. Pulmonic Valve: The pulmonic valve was normal in structure. Pulmonic valve regurgitation is not visualized. No evidence of pulmonic stenosis. Aorta: The aortic root is normal in size and structure. Venous: The inferior vena cava is normal in size with greater than 50% respiratory variability, suggesting right atrial pressure of 3 mmHg. IAS/Shunts: No atrial level shunt detected by color flow Doppler.  LEFT VENTRICLE PLAX 2D LVIDd:         3.90 cm     Diastology LVIDs:         2.70 cm     LV e' medial:    9.03 cm/s LV PW:         0.80 cm     LV E/e' medial:  5.5 LV IVS:         0.70 cm     LV e' lateral:   8.81 cm/s LVOT diam:     1.80 cm     LV E/e' lateral: 5.7 LV SV:         43 LV SV Index:   29          2D Longitudinal Strain LVOT Area:     2.54 cm    2D Strain GLS (A4C):   -22.3 %                            2D Strain GLS (A3C):   -30.8 %                            2D Strain GLS (A2C):   -19.8 % LV Volumes (MOD)           2D Strain GLS Avg:     -24.3 % LV vol d, MOD A2C: 36.5 ml LV vol d, MOD A4C: 37.7 ml LV vol s, MOD A2C: 11.8 ml LV vol s, MOD A4C: 11.1 ml LV SV MOD A2C:     24.7 ml LV SV MOD A4C:     37.7 ml LV SV MOD BP:      25.5 ml RIGHT VENTRICLE RV Basal diam:  2.90 cm     PULMONARY VEINS RV S prime:     10.80 cm/s  Diastolic Velocity: 50.10 cm/s TAPSE (M-mode): 1.5 cm      S/D Velocity:       1.40                             Systolic Velocity:  71.10 cm/s LEFT ATRIUM             Index        RIGHT ATRIUM          Index LA  diam:        2.70 cm 1.80 cm/m   RA Area:     8.82 cm LA Vol (A2C):   27.0 ml 18.00 ml/m  RA Volume:   18.30 ml 12.20 ml/m LA Vol (A4C):   18.3 ml 12.20 ml/m LA Biplane Vol: 22.6 ml 15.07 ml/m  AORTIC VALVE                    PULMONIC VALVE AV Area (Vmax):    2.48 cm     PV Vmax:       0.99 m/s AV Area (Vmean):   2.17 cm     PV Peak grad:  3.9 mmHg AV Area (VTI):     2.13 cm AV Vmax:           112.00 cm/s AV Vmean:          81.700 cm/s AV VTI:            0.202 m AV Peak Grad:      5.0 mmHg AV Mean Grad:      3.0 mmHg LVOT Vmax:         109.00 cm/s LVOT Vmean:        69.700 cm/s LVOT VTI:          0.169 m LVOT/AV VTI ratio: 0.84  AORTA Ao Root diam: 2.90 cm Ao Asc diam:  2.60 cm MITRAL VALVE               TRICUSPID VALVE MV Area (PHT): 4.19 cm    TR Peak grad:   23.6 mmHg MV Area VTI:   3.73 cm    TR Vmax:        243.00 cm/s MV Peak grad:  1.5 mmHg MV Mean grad:  1.0 mmHg    SHUNTS MV Vmax:       0.62 m/s    Systemic VTI:  0.17 m MV Vmean:      41.8 cm/s   Systemic Diam: 1.80 cm MV Decel Time: 181 msec MV E velocity: 50.10 cm/s MV A velocity:  75.40 cm/s MV E/A ratio:  0.66 Annabella Scarce MD Electronically signed by Annabella Scarce MD Signature Date/Time: 06/17/2024/3:48:03 PM    Final    DG CHEST PORT 1 VIEW Result Date: 06/14/2024 EXAM: 1 VIEW(S) XRAY OF THE CHEST 06/14/2024 11:42:00 AM COMPARISON: 05/22/2024 and 07/25/2021. CLINICAL HISTORY: Shortness of breath. FINDINGS: LINES, TUBES AND DEVICES: Right internal carotid artery catheter is noted. LUNGS AND PLEURA: Elevated left hemidiaphragm is noted. Status post left lobectomy. Large cystic areas are seen in the residual left lung suggesting sequela of prior inflammation. Mild left basal atelectasis or inflammation is noted. No pleural effusion. No pneumothorax. HEART AND MEDIASTINUM: Mediastinal shift to the left is noted. No acute abnormality of the cardiac silhouette. BONES AND SOFT TISSUES: No acute osseous abnormality. IMPRESSION: 1. Elevated left hemidiaphragm, status post left lobectomy, with mediastinal shift to the left. 2. Large cystic areas in the residual left lung, compatible with sequelae of prior inflammation. 3. Mild left basilar atelectasis or inflammation. Electronically signed by: Lynwood Seip MD 06/14/2024 12:16 PM EST RP Workstation: [id number]    Microbiology: Recent Results (from the past 240 hours)  Fungus Culture With Stain     Status: None (Preliminary result)   Collection Time: 06/14/24  9:01 AM   Specimen: Bronchial Alveolar Lavage; Respiratory  Result Value Ref Range Status   Fungus Stain Final report  Final    Comment: (NOTE) Performed  At: [facility] [address] [place], [place] [id number] Jennette Shorter MD Ey:[id number]    Fungus (Mycology) Culture PENDING  Incomplete   Fungal Source BRONCHIAL ALVEOLAR LAVAGE  Final    Comment: Performed at [facility], [address], [place], [place] [address]  Aerobic/Anaerobic Culture w Gram Stain (surgical/deep wound)     Status: None   Collection Time: 06/14/24  9:01 AM   Specimen:  Bronchial Alveolar Lavage; Respiratory  Result Value Ref Range Status   Specimen Description BRONCHIAL ALVEOLAR LAVAGE  Final   Special Requests LLL  Final   Gram Stain   Final    MODERATE WBC PRESENT,BOTH PMN AND MONONUCLEAR RARE GRAM VARIABLE ROD    Culture   Final    ABUNDANT PSEUDOMONAS AERUGINOSA NO ANAEROBES ISOLATED Performed at [facility], [address], [place], [place] [address]    Report Status 06/19/2024 FINAL  Final   Organism ID, Bacteria PSEUDOMONAS AERUGINOSA  Final      Susceptibility   Pseudomonas aeruginosa - MIC*    MEROPENEM 1 SENSITIVE Sensitive     CIPROFLOXACIN  0.25 SENSITIVE Sensitive     IMIPENEM 2 SENSITIVE Sensitive     PIP/TAZO Value in next row Sensitive      16 SENSITIVEThis is a modified FDA-approved test that has been validated and its performance characteristics determined by the reporting laboratory.  This laboratory is certified under the Clinical Laboratory Improvement Amendments CLIA as qualified to perform high complexity clinical laboratory testing.    CEFEPIME  Value in next row Sensitive      16 SENSITIVEThis is a modified FDA-approved test that has been validated and its performance characteristics determined by the reporting laboratory.  This laboratory is certified under the Clinical Laboratory Improvement Amendments CLIA as qualified to perform high complexity clinical laboratory testing.    CEFTAZIDIME/AVIBACTAM Value in next row Sensitive      16 SENSITIVEThis is a modified FDA-approved test that has been validated and its performance characteristics determined by the reporting laboratory.  This laboratory is certified under the Clinical Laboratory Improvement Amendments CLIA as qualified to perform high complexity clinical laboratory testing.    CEFTOLOZANE/TAZOBACTAM Value in next row Sensitive      16 SENSITIVEThis is a modified FDA-approved test that has been validated and its performance characteristics determined by the  reporting laboratory.  This laboratory is certified under the Clinical Laboratory Improvement Amendments CLIA as qualified to perform high complexity clinical laboratory testing.    TOBRAMYCIN Value in next row Sensitive      16 SENSITIVEThis is a modified FDA-approved test that has been validated and its performance characteristics determined by the reporting laboratory.  This laboratory is certified under the Clinical Laboratory Improvement Amendments CLIA as qualified to perform high complexity clinical laboratory testing.    CEFTAZIDIME Value in next row Sensitive      16 SENSITIVEThis is a modified FDA-approved test that has been validated and its performance characteristics determined by the reporting laboratory.  This laboratory is certified under the Clinical Laboratory Improvement Amendments CLIA as qualified to perform high complexity clinical laboratory testing.    * ABUNDANT PSEUDOMONAS AERUGINOSA  Acid Fast Smear (AFB)     Status: None   Collection Time: 06/14/24  9:01 AM   Specimen: Bronchial Alveolar Lavage; Respiratory  Result Value Ref Range Status   AFB Specimen Processing Concentration  Final   Acid Fast Smear Negative  Final    Comment: (NOTE) Performed At: [facility]  [address] [place], [place] [id number] Jennette Shorter MD Ey:[id number]    Source (AFB) BRONCHIAL ALVEOLAR LAVAGE  Final    Comment: Performed at [facility], [address], [place], [place] [address]  Fungus Culture Result     Status: None   Collection Time: 06/14/24  9:01 AM  Result Value Ref Range Status   Result 1 Comment  Final    Comment: (NOTE) KOH/Calcofluor preparation:  no fungus observed. Performed At: [facility] [address] [place], [place] [id number] Jennette Shorter MD Ey:[id number]   Resp panel by RT-PCR (RSV, Flu A&B, Covid) Anterior Nasal Swab     Status: None   Collection Time: 06/19/24 11:18 AM   Specimen: Anterior Nasal Swab  Result Value Ref Range  Status   SARS Coronavirus 2 by RT PCR NEGATIVE NEGATIVE Final   Influenza A by PCR NEGATIVE NEGATIVE Final   Influenza B by PCR NEGATIVE NEGATIVE Final    Comment: (NOTE) The Xpert Xpress SARS-CoV-2/FLU/RSV plus assay is intended as an aid in the diagnosis of influenza from Nasopharyngeal swab specimens and should not be used as a sole basis for treatment. Nasal washings and aspirates are unacceptable for Xpert Xpress SARS-CoV-2/FLU/RSV testing.  Fact Sheet for Patients: bloggercourse.com  Fact Sheet for Healthcare Providers: seriousbroker.it  This test is not yet approved or cleared by the [place]  FDA and has been authorized for detection and/or diagnosis of SARS-CoV-2 by FDA under an Emergency Use Authorization (EUA). This EUA will remain in effect (meaning this test can be used) for the duration of the COVID-19 declaration under Section 564(b)(1) of the Act, 21 U.S.C. section 360bbb-3(b)(1), unless the authorization is terminated or revoked.     Resp Syncytial Virus by PCR NEGATIVE NEGATIVE Final    Comment: (NOTE) Fact Sheet for Patients: bloggercourse.com  Fact Sheet for Healthcare Providers: seriousbroker.it  This test is not yet approved or cleared by the [place]  FDA and has been authorized for detection and/or diagnosis of SARS-CoV-2 by FDA under an Emergency Use Authorization (EUA). This EUA will remain in effect (meaning this test can be used) for the duration of the COVID-19 declaration under Section 564(b)(1) of the Act, 21 U.S.C. section 360bbb-3(b)(1), unless the authorization is terminated or revoked.  Performed at [facility], [address], [place], [place] [address]   Blood Culture (routine x 2)     Status: None (Preliminary result)   Collection Time: 06/19/24 11:50 AM   Specimen: BLOOD RIGHT ARM  Result Value Ref Range Status    Specimen Description BLOOD RIGHT ARM  Final   Special Requests   Final    BOTTLES DRAWN AEROBIC ONLY Blood Culture results may not be optimal due to an inadequate volume of blood received in culture bottles   Culture   Final    NO GROWTH 2 DAYS Performed at [facility], [address] [place], [place] [address]    Report Status PENDING  Incomplete  Blood Culture (routine x 2)     Status: None (Preliminary result)   Collection Time: 06/19/24  6:55 PM   Specimen: BLOOD RIGHT HAND  Result Value Ref Range Status   Specimen Description BLOOD RIGHT HAND  Final   Special Requests   Final    BOTTLES DRAWN AEROBIC ONLY Blood Culture results may not be optimal due to an inadequate volume of blood received in culture bottles   Culture   Final    NO GROWTH 2 DAYS Performed  at [facility], [address], [place], [place] [address]    Report Status PENDING  Incomplete     Labs: Basic Metabolic Panel: Recent Labs  Lab 06/19/24 1125 06/19/24 1855 06/20/24 0344 06/21/24 0259 06/22/24 0319  NA 134*  --  135 138 137  K 3.5  --  4.2 3.6 3.8  CL 96*  --  104 108 102  CO2 24  --  24 23 28   GLUCOSE 84  --  112* 93 82  BUN 9  --  8 6 6   CREATININE 0.79  --  0.55 0.71 0.74  CALCIUM 9.6  --  8.6* 8.5* 8.6*  MG  --  1.8  --   --   --    Liver Function Tests: Recent Labs  Lab 06/19/24 1125 06/20/24 0344  AST 22 13*  ALT 21 17  ALKPHOS 89 71  BILITOT 0.5 0.5  PROT 7.8 6.2*  ALBUMIN 2.4* 1.8*   No results for input(s): LIPASE, AMYLASE in the last 168 hours. No results for input(s): AMMONIA in the last 168 hours. CBC: Recent Labs  Lab 06/19/24 1125 06/20/24 0344 06/21/24 0259 06/22/24 0319  WBC 16.7* 12.2* 11.6* 11.0*  NEUTROABS 12.7*  --  8.2* 7.5  HGB 11.4* 8.8* 7.8* 8.0*  HCT 34.8* 27.9* 24.3* 25.4*  MCV 81.3 83.5 83.5 83.8  PLT 628* 547* 421* 405*   Cardiac Enzymes: No results for input(s): CKTOTAL, CKMB, CKMBINDEX, TROPONINI in the last 168  hours. BNP: BNP (last 3 results) No results for input(s): BNP in the last 8760 hours.  ProBNP (last 3 results) No results for input(s): PROBNP in the last 8760 hours.  CBG: No results for input(s): GLUCAP in the last 168 hours.  Signed:  Jai-Gurmukh Shain Pauwels MD   [facility] 06/22/2024, 6:49 AM      [1]  Allergies Allergen Reactions   Aspirin Anaphylaxis and Shortness Of Breath    THROAT CLOSES   Protonix  [Pantoprazole ] Nausea Only and Other (See Comments)    Also caused a film in the mouth and caused chest pressure   Doxycycline  Other (See Comments)    Hallucinations, headaches   Promethazine -Codeine  Cough    Worsening cough and insomnia    Sulfamethoxazole-Trimethoprim  Other (See Comments)    unknown   Iodinated Contrast Media Rash    Patient allergic to contrast used in radiation oncology for CT simulation

## 2024-06-23 DIAGNOSIS — R531 Weakness: Secondary | ICD-10-CM | POA: Diagnosis not present

## 2024-06-24 ENCOUNTER — Telehealth: Payer: Self-pay | Admitting: *Deleted

## 2024-06-24 LAB — CULTURE, BLOOD (ROUTINE X 2)
Culture: NO GROWTH
Culture: NO GROWTH

## 2024-06-24 NOTE — Telephone Encounter (Signed)
 Pt called with concerns about cancelled treatments. Advised that while on abt treatments were cancelled and will f/u on next visit. Pt verbalized understanding.

## 2024-06-25 ENCOUNTER — Inpatient Hospital Stay: Admitting: Adult Health

## 2024-06-25 ENCOUNTER — Inpatient Hospital Stay: Admitting: Hematology and Oncology

## 2024-06-25 ENCOUNTER — Inpatient Hospital Stay

## 2024-06-25 LAB — FUNGITELL BETA-D-GLUCAN: Fungitell Value:: <31.25 pg/mL

## 2024-06-26 ENCOUNTER — Ambulatory Visit: Admitting: Acute Care

## 2024-06-26 ENCOUNTER — Encounter: Payer: Self-pay | Admitting: Acute Care

## 2024-06-26 VITALS — BP 81/52 | HR 72 | Ht 62.0 in | Wt 111.8 lb

## 2024-06-26 DIAGNOSIS — J449 Chronic obstructive pulmonary disease, unspecified: Secondary | ICD-10-CM

## 2024-06-26 DIAGNOSIS — Z9889 Other specified postprocedural states: Secondary | ICD-10-CM

## 2024-06-26 DIAGNOSIS — J984 Other disorders of lung: Secondary | ICD-10-CM | POA: Diagnosis not present

## 2024-06-26 DIAGNOSIS — A498 Other bacterial infections of unspecified site: Secondary | ICD-10-CM | POA: Diagnosis not present

## 2024-06-26 DIAGNOSIS — J9589 Other postprocedural complications and disorders of respiratory system, not elsewhere classified: Secondary | ICD-10-CM

## 2024-06-26 DIAGNOSIS — F17210 Nicotine dependence, cigarettes, uncomplicated: Secondary | ICD-10-CM

## 2024-06-26 DIAGNOSIS — J479 Bronchiectasis, uncomplicated: Secondary | ICD-10-CM

## 2024-06-26 DIAGNOSIS — R0609 Other forms of dyspnea: Secondary | ICD-10-CM

## 2024-06-26 DIAGNOSIS — R9389 Abnormal findings on diagnostic imaging of other specified body structures: Secondary | ICD-10-CM

## 2024-06-26 DIAGNOSIS — J189 Pneumonia, unspecified organism: Secondary | ICD-10-CM

## 2024-06-26 DIAGNOSIS — R911 Solitary pulmonary nodule: Secondary | ICD-10-CM

## 2024-06-26 DIAGNOSIS — F17200 Nicotine dependence, unspecified, uncomplicated: Secondary | ICD-10-CM

## 2024-06-26 MED ORDER — FLUTICASONE-SALMETEROL 250-50 MCG/ACT IN AEPB: 1.0000 | INHALATION_SPRAY | Freq: Two times a day (BID) | RESPIRATORY_TRACT | 6 refills | Status: DC

## 2024-06-26 NOTE — Patient Instructions (Addendum)
 It is good to see you today.  I am so sorry you have had such a hard time after your bronchoscopy and biopsy. Your biopsy was negative for malignancy, but positive for pseudomonas. Continue your Cipro  as you have been doing. Follow up with Dr. Fleeta Rothman 07/15/2024 as is scheduled.  I have sent in a prescription for Wixella. Take one puff once daily Rinse mouth after use.  Please drink lots of water today. Your blood pressure is 82/52 which is a little bit low. If you become symptomatic at all, with dizziness or feel unsteady on your feet please seek emergency care for IV fluids. Call for any fever, so we can ensure we have you cover with the right antibiotic. Call if he needs for anything before your January 5 appointment with Dr. Lindia. Happy have a happy holiday. Please contact office for sooner follow up if symptoms do not improve or worsen or seek emergency care   You can receive free nicotine  replacement therapy (patches, gum, or mints) by calling 1-800-QUIT NOW. Please call so we can get you on the path to becoming a non-smoker. I know it is hard, but you can do this!  Hypnosis for smoking cessation  [employer] [phone]  Acupuncture for smoking cessation  [employer] [phone]

## 2024-06-26 NOTE — Progress Notes (Signed)
 History of Present Illness Melissa [name] is a 58 y.o. female current every day smoker with history of history of breast cancer with intracranial metastasis and chronic left-sided bronchiectasis seen by Melissa [name], Melissa [name] [facility] NP as an inpatient , and Melissa Lites NP post bronchoscopy.   Synopsis 58 y.o. female  with history of breast cancer with intracranial metastasis status post recent radiation and on a Avastin  therapy as well as background tamoxifen  with history of fungal infections status post left upper lobe lobectomy, concern for more recent aspergillosis on and off azole therapy currently off for some time with stable imaging findings, chronic left-sided bronchiectasis. Seen for several weeks of cough, CT Chest 05/2024 showed  enlargement of chronic cavitary versus severely dilated bronchial/bronchiectasis with air-fluid level . Plan was for  bronchoscopy with BAL to ascertain fungal versus bacterial and help decide treatment moving forward. Underwent bronch 12/5. BAL grew pan sensitive pseudomonas reported out 12/10. 12/10 as well the patient presented to [facility] ED complaining of fever, chills, and lethargy. Xray showed new hydropneumothorax on the left.She was admitted and treated with IV fluid boluses started on azithromycin  and cefepime  vancomycin . ID was consulted. She was discharged on Cipro  x 4 weeks with follow up CT Chest at discharge.   Hx of structural lung dx from previous fungal infection.  that required previous LULobectomy( Chronic Pulmonary aspergillis). Undergoing workup for a new progressive cavitary PNA, bronchoscopy last week Unfortunately more tired and poor PO since Bronch showing pseudomonas She is on chronic decadron  , so immunocompromised. CT showing progressive PNA  D/w Melissa [name]: admit for IV abx and ID consult, encourage PO, likely will need longer course to try to eradicate but will be difficult at best given degree of structural  abnormalities Will arrange tentative f/u with Melissa [name] in 4 weeks, available PRN   06/26/2024 Discussed the use of AI scribe software for clinical note transcription with the patient, who gave verbal consent to proceed.  History of Present Illness Pt. Presents for follow up after bronch with biopsies. She initially did well after the bronch. She states she had some scant blood that lasted afew days and then self resolved.She did not initially  have fever and discolored secretions , no significant worsening of breathing.No anesthesia issues. However,  5 days post bronchoscopy with biopsies she presented to the [name] ED on 12/10 complaining of fever chills and lethargy.  At that time chest x-ray showed a hydropneumothorax on the left, and micro- culture results were positive for Pseudomonas.  Patient was admitted for cavitary pneumonia.  She was treated with IV cefepime , vancomycin , and azithromycin .  She was seen by infectious disease and she was transition to oral Cipro  and discharged home 3 days after admission.  She is here today for follow-up after bronchoscopy with biopsies.  We have reviewed the fact that her biopsy was negative for malignancy.  She was already aware of the Pseudomonas in her sputum as she had been hospitalized and treated.  She states she is feeling much better.  She has thick yellow mucus, a relatively nonproductive cough.  Since discharge from the hospital she denies any fever.  Blood pressure today is low 81/52.  She denies any symptoms of dizziness or lightheadedness.  She denies any falls.  Her husband is here with her today and he endorses that she has been totally asymptomatic of the low blood pressure.  As she is asymptomatic I have asked her to force fluids today and  to salt her food to see if we can get a slight increase in her blood pressure.  She did agree that if she developed any symptoms such as lightheadedness, dizziness, or fall she would present to the  emergency room for IV fluids.  She does endorse dyspnea on exertion.  She said in the hospital she was using a Symbicort  inhaler which helped a great deal.  Her insurance does not cover Symbicort .  Wixella is the inhaler of choice per her insurance.  I have placed an order for Wixela 1 puff twice daily to see if this provides the patient with benefit.  If it does not we will document that as a failure and seek triple therapy.  Patient is currently smoking.  We did talk about smoking cessation and we have provided her resources to help her quit.  Please see her after visit summary for specifics.   Test Results: Cytology 06/15/2019 A. LUNG, LLL, LAVAGE:    FINAL MICROSCOPIC DIAGNOSIS:  - No malignant cells identified  - Abundant acute inflammation   Micro 06/14/2024 PSEUDOMONAS AERUGINOSA  CEFEPIME  4 SENSITIVE Sensitive     CEFTAZIDIME 2 SENSITIVE Sensitive    CEFTAZIDIME/AVIBACTAM 2 SENSITIVE Sensitive    CEFTOLOZANE/TAZOBACTAM 1 SENSITIVE Sensitive    CIPROFLOXACIN  0.25 SENSIT... Sensitive    IMIPENEM 2 SENSITIVE Sensitive    MEROPENEM 1 SENSITIVE Sensitive    PIP/TAZO  Sensitive 1    TOBRAMYCIN <=1 SENSITIVE Sensitive           Negative for fungal Negative for AFB  Micro 06/19/2024 Blood culture no growth x 5 days    Latest Ref Rng & Units 06/22/2024    3:19 AM 06/21/2024    2:59 AM 06/20/2024    3:44 AM  CBC  WBC 4.0 - 10.5 K/uL 11.0  11.6  12.2   Hemoglobin 12.0 - 15.0 g/dL 8.0  7.8  8.8   Hematocrit 36.0 - 46.0 % 25.4  24.3  27.9   Platelets 150 - 400 K/uL 405  421  547        Latest Ref Rng & Units 06/22/2024    3:19 AM 06/21/2024    2:59 AM 06/20/2024    3:44 AM  BMP  Glucose 70 - 99 mg/dL 82  93  887   BUN 6 - 20 mg/dL 6  6  8    Creatinine 0.44 - 1.00 mg/dL 9.25  9.28  9.44   Sodium 135 - 145 mmol/L 137  138  135   Potassium 3.5 - 5.1 mmol/L 3.8  3.6  4.2   Chloride 98 - 111 mmol/L 102  108  104   CO2 22 - 32 mmol/L 28  23  24    Calcium 8.9 - 10.3 mg/dL  8.6  8.5  8.6     BNP No results found for: BNP  ProBNP No results found for: PROBNP  PFT    Component Value Date/Time   FEV1PRE 1.94 08/09/2016 0943   FEV1POST 2.05 08/09/2016 0943   FVCPRE 2.79 08/09/2016 0943   FVCPOST 2.92 08/09/2016 0943   TLC 4.75 08/09/2016 0943   DLCOUNC 10.08 08/09/2016 0943   PREFEV1FVCRT 69 08/09/2016 0943   PSTFEV1FVCRT 70 08/09/2016 0943    CT Chest Wo Contrast Result Date: 06/19/2024 CLINICAL DATA:  Left upper lobe cavitary lesion and hydropneumothorax. EXAM: CT CHEST WITHOUT CONTRAST TECHNIQUE: Multidetector CT imaging of the chest was performed following the standard protocol without IV contrast. RADIATION DOSE REDUCTION: This exam was performed according to  the departmental dose-optimization program which includes automated exposure control, adjustment of the mA and/or kV according to patient size and/or use of iterative reconstruction technique. COMPARISON:  Chest radiograph dated 06/19/2024 and CT dated 05/22/2024. FINDINGS: Evaluation of this exam is limited in the absence of intravenous contrast. Cardiovascular: There is no cardiomegaly or pericardial effusion. The thoracic aorta and central pulmonary arteries are grossly unremarkable. Right-sided Port-A-Cath with tip in the central SVC close to the cavoatrial junction. Mediastinum/Nodes: No obvious hilar adenopathy. Cardiopulmonary window lymph nodes measure up to 8 mm. The esophagus is grossly unremarkable. No mediastinal fluid collection. Lungs/Pleura: Postsurgical changes of the left upper lobectomy. There is bronchiectasis of the left lower lobe. Large cavitary space in the left lung with air-fluid level. Slight interval increase in the size of the fluid content within the cavitation since the prior CT. The upper cavitary component measures approximately 12 x 6 cm in greatest axial dimensions (previously 11 x 5 cm). There is diffuse ground-glass nodular density throughout the lungs, new since the  prior CT most consistent with an infectious process, likely atypical infection. The central airways are patent. Upper Abdomen: No acute abnormality. Musculoskeletal: Degenerative changes. No acute osseous pathology. Left axillary surgical clips. IMPRESSION: 1. Postsurgical changes of the left upper lobectomy. 2. Large cavitary space in the left lung with air-fluid level. Slight interval increase of the fluid content within the cavitation since the prior CT. 3. Diffuse ground-glass nodular density throughout the lungs, new since the prior CT most consistent with an infectious process, likely atypical infection. Electronically Signed   By: Vanetta Chou M.D.   On: 06/19/2024 15:01   DG Chest 2 View Result Date: 06/19/2024 EXAM: 2 VIEW(S) XRAY OF THE CHEST 06/19/2024 11:49:00 AM COMPARISON: 06/14/2024 CLINICAL HISTORY: Fatigue FINDINGS: LINES, TUBES AND DEVICES: Right chest Port-A-Cath in place with tip overlying the expected region of the superior cavoatrial junction, stable. LUNGS AND PLEURA: Chronic volume loss in the left hemithorax. Cavitary lesion in the left upper lobe with associated loculated hydropneumothorax. Elevated left hemidiaphragm. HEART AND MEDIASTINUM: No acute abnormality of the cardiac and mediastinal silhouettes. BONES AND SOFT TISSUES: Left axillary surgical clips noted. No acute osseous abnormality. IMPRESSION: 1. Cavitary lesion in the left upper lobe with associated loculated hydropneumothorax. 2. Chronic volume loss in the left hemithorax and elevated left hemidiaphragm. Electronically signed by: Evalene Coho MD 06/19/2024 12:08 PM EST RP Workstation: [id number]   ECHOCARDIOGRAM COMPLETE Result Date: 06/17/2024    ECHOCARDIOGRAM REPORT   Patient Name:   Melissa [name] [facility] Date of Exam: 06/17/2024 Medical Rec #:  [id number]     Height:       62.0 in Accession #:    [id number]    Weight:       113.0 lb Date of Birth:  06-27-1966     BSA:          1.500 m Patient Age:    58 years      BP:            98/58 mmHg Patient Gender: F             HR:           104 bpm. Exam Location:  Outpatient Procedure: 2D Echo and Strain Analysis (Both Spectral and Color Flow Doppler            were utilized during procedure). Indications:    Chemo  History:        Patient has prior history of Echocardiogram examinations.  Signs/Symptoms:Chemo.  Sonographer:    Norleen Amour Referring Phys: [id number] LINDSEY CORNETTO CAUSEY IMPRESSIONS  1. Left ventricular ejection fraction, by estimation, is 60 to 65%. The left ventricle has normal function. The left ventricle has no regional wall motion abnormalities. Left ventricular diastolic parameters are consistent with Grade I diastolic dysfunction (impaired relaxation). The average left ventricular global longitudinal strain is -24.3 %. The global longitudinal strain is normal.  2. Right ventricular systolic function is normal. The right ventricular size is normal. There is normal pulmonary artery systolic pressure.  3. The mitral valve is normal in structure. No evidence of mitral valve regurgitation. No evidence of mitral stenosis.  4. The aortic valve is tricuspid. Aortic valve regurgitation is not visualized. No aortic stenosis is present.  5. The inferior vena cava is normal in size with greater than 50% respiratory variability, suggesting right atrial pressure of 3 mmHg. FINDINGS  Left Ventricle: Left ventricular ejection fraction, by estimation, is 60 to 65%. The left ventricle has normal function. The left ventricle has no regional wall motion abnormalities. The average left ventricular global longitudinal strain is -24.3 %. Strain was performed and the global longitudinal strain is normal. The left ventricular internal cavity size was normal in size. There is no left ventricular hypertrophy. Left ventricular diastolic parameters are consistent with Grade I diastolic dysfunction (impaired relaxation). Normal left ventricular filling pressure. Right Ventricle:  The right ventricular size is normal. No increase in right ventricular wall thickness. Right ventricular systolic function is normal. There is normal pulmonary artery systolic pressure. The tricuspid regurgitant velocity is 2.43 m/s, and  with an assumed right atrial pressure of 3 mmHg, the estimated right ventricular systolic pressure is 26.6 mmHg. Left Atrium: Left atrial size was normal in size. Right Atrium: Right atrial size was normal in size. Pericardium: There is no evidence of pericardial effusion. Mitral Valve: The mitral valve is normal in structure. No evidence of mitral valve regurgitation. No evidence of mitral valve stenosis. MV peak gradient, 1.5 mmHg. The mean mitral valve gradient is 1.0 mmHg. Tricuspid Valve: The tricuspid valve is normal in structure. Tricuspid valve regurgitation is trivial. No evidence of tricuspid stenosis. Aortic Valve: The aortic valve is tricuspid. Aortic valve regurgitation is not visualized. No aortic stenosis is present. Aortic valve mean gradient measures 3.0 mmHg. Aortic valve peak gradient measures 5.0 mmHg. Aortic valve area, by VTI measures 2.13 cm. Pulmonic Valve: The pulmonic valve was normal in structure. Pulmonic valve regurgitation is not visualized. No evidence of pulmonic stenosis. Aorta: The aortic root is normal in size and structure. Venous: The inferior vena cava is normal in size with greater than 50% respiratory variability, suggesting right atrial pressure of 3 mmHg. IAS/Shunts: No atrial level shunt detected by color flow Doppler.  LEFT VENTRICLE PLAX 2D LVIDd:         3.90 cm     Diastology LVIDs:         2.70 cm     LV e' medial:    9.03 cm/s LV PW:         0.80 cm     LV E/e' medial:  5.5 LV IVS:        0.70 cm     LV e' lateral:   8.81 cm/s LVOT diam:     1.80 cm     LV E/e' lateral: 5.7 LV SV:         43 LV SV Index:   29  2D Longitudinal Strain LVOT Area:     2.54 cm    2D Strain GLS (A4C):   -22.3 %                            2D  Strain GLS (A3C):   -30.8 %                            2D Strain GLS (A2C):   -19.8 % LV Volumes (MOD)           2D Strain GLS Avg:     -24.3 % LV vol d, MOD A2C: 36.5 ml LV vol d, MOD A4C: 37.7 ml LV vol s, MOD A2C: 11.8 ml LV vol s, MOD A4C: 11.1 ml LV SV MOD A2C:     24.7 ml LV SV MOD A4C:     37.7 ml LV SV MOD BP:      25.5 ml RIGHT VENTRICLE RV Basal diam:  2.90 cm     PULMONARY VEINS RV S prime:     10.80 cm/s  Diastolic Velocity: 50.10 cm/s TAPSE (M-mode): 1.5 cm      S/D Velocity:       1.40                             Systolic Velocity:  71.10 cm/s LEFT ATRIUM             Index        RIGHT ATRIUM          Index LA diam:        2.70 cm 1.80 cm/m   RA Area:     8.82 cm LA Vol (A2C):   27.0 ml 18.00 ml/m  RA Volume:   18.30 ml 12.20 ml/m LA Vol (A4C):   18.3 ml 12.20 ml/m LA Biplane Vol: 22.6 ml 15.07 ml/m  AORTIC VALVE                    PULMONIC VALVE AV Area (Vmax):    2.48 cm     PV Vmax:       0.99 m/s AV Area (Vmean):   2.17 cm     PV Peak grad:  3.9 mmHg AV Area (VTI):     2.13 cm AV Vmax:           112.00 cm/s AV Vmean:          81.700 cm/s AV VTI:            0.202 m AV Peak Grad:      5.0 mmHg AV Mean Grad:      3.0 mmHg LVOT Vmax:         109.00 cm/s LVOT Vmean:        69.700 cm/s LVOT VTI:          0.169 m LVOT/AV VTI ratio: 0.84  AORTA Ao Root diam: 2.90 cm Ao Asc diam:  2.60 cm MITRAL VALVE               TRICUSPID VALVE MV Area (PHT): 4.19 cm    TR Peak grad:   23.6 mmHg MV Area VTI:   3.73 cm    TR Vmax:        243.00 cm/s MV Peak grad:  1.5 mmHg MV Mean grad:  1.0 mmHg  SHUNTS MV Vmax:       0.62 m/s    Systemic VTI:  0.17 m MV Vmean:      41.8 cm/s   Systemic Diam: 1.80 cm MV Decel Time: 181 msec MV E velocity: 50.10 cm/s MV A velocity: 75.40 cm/s MV E/A ratio:  0.66 Annabella Scarce MD Electronically signed by Annabella Scarce MD Signature Date/Time: 06/17/2024/3:48:03 PM    Final    DG CHEST PORT 1 VIEW Result Date: 06/14/2024 EXAM: 1 VIEW(S) XRAY OF THE CHEST 06/14/2024  11:42:00 AM COMPARISON: 05/22/2024 and 07/25/2021. CLINICAL HISTORY: Shortness of breath. FINDINGS: LINES, TUBES AND DEVICES: Right internal carotid artery catheter is noted. LUNGS AND PLEURA: Elevated left hemidiaphragm is noted. Status post left lobectomy. Large cystic areas are seen in the residual left lung suggesting sequela of prior inflammation. Mild left basal atelectasis or inflammation is noted. No pleural effusion. No pneumothorax. HEART AND MEDIASTINUM: Mediastinal shift to the left is noted. No acute abnormality of the cardiac silhouette. BONES AND SOFT TISSUES: No acute osseous abnormality. IMPRESSION: 1. Elevated left hemidiaphragm, status post left lobectomy, with mediastinal shift to the left. 2. Large cystic areas in the residual left lung, compatible with sequelae of prior inflammation. 3. Mild left basilar atelectasis or inflammation. 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)    Coccidioidomycosis 11/05/2023   Complication of anesthesia    bp dropped + desat    COPD (chronic obstructive pulmonary disease) (HCC)    Dyspnea    DOE   GERD (gastroesophageal reflux disease)    H/O coccidioidomycosis    was reason for lung lobectomy   Headache(784.0)    due to eye strain or not eating   Hemoptysis 05/08/2023   History of anemia    no current problem   History of asthma    as a child   History of breast cancer 2014   left   History of chemotherapy    finished 07/17/2013   History of hiatal hernia    AGE 65   Hx of radiation therapy  03/25/13-05/06/13   left breast 5000 cGy/25 sessions, left breast boost 1000 cGy/5 sessions   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   Personal history of chemotherapy    for liver cancer   Personal history of radiation therapy    Pneumonia    LAST FLARE UP 01/2018   Rash 02/22/2021   Runny nose 07/30/2013   clear drainage   Smoker 05/24/2021   Wears dentures    upper     Social History[1]  Ms.Trapp reports that she has been smoking cigarettes. She has a 11.4 [name]-year smoking history. She has never used smokeless tobacco. She reports that she does not drink alcohol and does not use drugs.  Tobacco Cessation: Ready to quit: Not Answered Counseling given: Not Answered Tobacco comments: 1 [name] a day 06/10/2024 KRD Current everyday smoker Resources provided to help assist with cessation, please see AVS  Past  surgical hx, Family hx, Social hx all reviewed.  Current Outpatient Medications on File Prior to Visit  Medication Sig   acetaminophen  (TYLENOL ) 325 MG tablet Take 2 tablets (650 mg total) by mouth every 6 (six) hours as needed for mild pain (or Fever >/= 101).   albuterol  (VENTOLIN  HFA) 108 (90 Base) MCG/ACT inhaler Inhale 2 puffs into the lungs every 6 (six) hours as needed for wheezing or shortness of breath (cough).   chlorpheniramine -HYDROcodone  (TUSSIONEX) 10-8 MG/5ML Take 5 mLs by mouth 2 (two) times daily.   ciprofloxacin  (CIPRO ) 750 MG tablet Take 1 tablet (750 mg total) by mouth 2 (two) times daily for 57 doses.   dexamethasone  (DECADRON ) 2 MG tablet Take 1 tablet (2 mg total) by mouth daily.   diphenhydrAMINE  (BENADRYL ) 50 MG tablet Take 50 mg of Benadryl  on 05/22/24 at 1:15 pm with the last dose of Prednisone  50 mg . Please call [phone] with any questions. (Patient taking differently: Take 25 mg by mouth daily as needed (For CT scan because of allergy  tie dye). Take 50 mg of Benadryl  on 05/22/24 at 1:15 pm with the last dose of Prednisone  50 mg . Please call [phone] with any  questions.)   escitalopram  (LEXAPRO ) 10 MG tablet Take 10 mg by mouth daily.   levalbuterol  (XOPENEX ) 0.63 MG/3ML nebulizer solution Take 3 mLs (0.63 mg total) by nebulization every 4 (four) hours as needed for wheezing or shortness of breath.   levETIRAcetam  (KEPPRA ) 250 MG tablet Take 1 tablet (250 mg total) by mouth 2 (two) times daily.   lidocaine -prilocaine  (EMLA ) cream APPLY EXTERNALLY TO THE AFFECTED AREA 1 TIME (Patient taking differently: Apply 1 Application topically daily as needed (for port).)   Multiple Vitamins-Minerals (EMERGEN-C VITAMIN C PO) Take 1 tablet by mouth daily.   naproxen sodium (ALEVE) 220 MG tablet Take 220 mg by mouth daily as needed (for pain).   ondansetron  (ZOFRAN -ODT) 4 MG disintegrating tablet Take 4 mg by mouth every 8 (eight) hours as needed for vomiting or nausea.   SYMBICORT  160-4.5 MCG/ACT inhaler Inhale 2 puffs into the lungs 2 (two) times daily.   Current Facility-Administered Medications on File Prior to Visit  Medication   alteplase  (CATHFLO ACTIVASE ) injection 2 mg   sodium chloride  flush (NS) 0.9 % injection 10 mL     Allergies[2]  Review Of Systems:  Constitutional:   No  weight loss, night sweats,  Fevers, chills, fatigue, or  lassitude.  HEENT:   No headaches,  Difficulty swallowing,  Tooth/dental problems, or  Sore throat,                No sneezing, itching, ear ache, nasal congestion, post nasal drip,   CV:  No chest pain,  Orthopnea, PND, swelling in lower extremities, anasarca, dizziness, palpitations, syncope.   GI  No heartburn, indigestion, abdominal pain, nausea, vomiting, diarrhea, change in bowel habits, loss of appetite, bloody stools.   Resp: + shortness of breath with exertion less at rest.  No excess mucus, no productive cough,  No non-productive cough,  No coughing up of blood.  No change in color of mucus.  No wheezing.  No chest wall deformity  Skin: no rash or lesions.  GU: no dysuria, change in color of urine, no  urgency or frequency.  No flank pain, no hematuria   MS:  No joint pain or swelling.  No decreased range of motion.  No back pain.  Psych:  No change in mood or affect. No depression  or anxiety.  No memory loss.   Vital Signs BP (!) 81/52   Pulse 72   Ht 5' 2 (1.575 m)   Wt 111 lb 12.8 oz (50.7 kg)   LMP 07/25/2012 Comment: pregnancy waiver form signed 01-18-2018  SpO2 92%   BMI 20.45 kg/m    Physical Exam:  General- No distress,  A&Ox3,  ENT: No sinus tenderness, TM clear, pale nasal mucosa, no oral exudate,no post nasal drip, no LAN Cardiac: S1, S2, regular rate and rhythm, no murmur Chest: No wheeze/ rales/ dullness; no accessory muscle use, no nasal flaring, no sternal retractions Abd.: Soft Non-tender, MD, BS +, Body mass index is 20.45 kg/m.  Ext: No clubbing cyanosis, edema, no obvious deformities Neuro:  normal strength, MAE x 4, A&O x 3, appropriate Skin: No rashes, warm and dry, no obvious skin lesions  Psych: normal mood and behavior  Physical Exam   Assessment & Plan Assessment/Plan Post bronchoscopy with biopsy Micro positive for Pseudomonas Recent treatment of cavitary pneumonia requiring hospitalization Current everyday smoker Dyspnea with exertion Plan I am so sorry you have had such a hard time after your bronchoscopy and biopsy. Your biopsy was negative for malignancy, but positive for pseudomonas. Continue your Cipro  as you have been doing. Follow up with Dr. Fleeta Rothman 07/15/2024 as is scheduled.  I have sent in a prescription for Wixella. Take one puff once daily Rinse mouth after use.  Please drink lots of water today. Your blood pressure is 82/52 which is a little bit low. If you become symptomatic at all, with dizziness or feel unsteady on your feet please seek emergency care for IV fluids. Call for any fever, so we can ensure we have you cover with the right antibiotic. Call if she needs us  for anything before your January 5 appointment with  Dr. Lindia. Follow up CT Chest in 3 months Follow up in 3 months to ensure you are doing well, and to review CT chest results.  Happy have a happy holiday. Please contact office for sooner follow up if symptoms do not improve or worsen or seek emergency care   You can receive free nicotine  replacement therapy (patches, gum, or mints) by calling 1-800-QUIT NOW. Please call so we can get you on the path to becoming a non-smoker. I know it is hard, but you can do this!  Hypnosis for smoking cessation  [employer] [phone]  Acupuncture for smoking cessation  [employer] [phone]     I spent 40 minutes dedicated to the care of this patient on the date of this encounter to include pre-visit review of records, face-to-face time with the patient discussing conditions above, post visit ordering of testing, clinical documentation with the electronic health record, making appropriate referrals as documented, and communicating necessary information to the patient's healthcare team.        Melissa JULIANNA Lites, NP 06/26/2024  8:33 AM             [1]  Social History Tobacco Use   Smoking status: Every Day    Current packs/day: 0.30    Average packs/day: 0.3 packs/day for 38.0 years (11.4 ttl pk-yrs)    Types: Cigarettes   Smokeless tobacco: Never   Tobacco comments:    1 [name] a day 06/10/2024 KRD  Vaping Use   Vaping status: Never Used  Substance Use Topics   Alcohol use: No   Drug use: No  [2]  Allergies Allergen Reactions   Aspirin  Anaphylaxis and Shortness Of Breath    THROAT CLOSES   Protonix  [Pantoprazole ] Nausea Only and Other (See Comments)    Also caused a film in the mouth and caused chest pressure   Doxycycline  Other (See Comments)    Hallucinations, headaches   Promethazine -Codeine  Cough    Worsening cough and insomnia    Sulfamethoxazole-Trimethoprim  Other (See Comments)    unknown   Iodinated Contrast Media Rash    Patient  allergic to contrast used in radiation oncology for CT simulation

## 2024-07-01 ENCOUNTER — Other Ambulatory Visit: Payer: Self-pay | Admitting: Hematology and Oncology

## 2024-07-01 MED ORDER — HYDROCOD POLI-CHLORPHE POLI ER 10-8 MG/5ML PO SUER: 5.0000 mL | Freq: Two times a day (BID) | ORAL | 0 refills | Status: DC

## 2024-07-02 DIAGNOSIS — J449 Chronic obstructive pulmonary disease, unspecified: Secondary | ICD-10-CM

## 2024-07-02 MED ORDER — FLUTICASONE-SALMETEROL 250-50 MCG/ACT IN AEPB: 1.0000 | INHALATION_SPRAY | Freq: Two times a day (BID) | RESPIRATORY_TRACT | 6 refills | Status: AC

## 2024-07-13 ENCOUNTER — Ambulatory Visit (HOSPITAL_COMMUNITY)
Admission: RE | Admit: 2024-07-13 | Discharge: 2024-07-13 | Disposition: A | Discharge status: Home / Self Care | Source: Ambulatory Visit | Attending: Acute Care | Admitting: Acute Care

## 2024-07-13 DIAGNOSIS — J479 Bronchiectasis, uncomplicated: Secondary | ICD-10-CM | POA: Diagnosis present

## 2024-07-15 ENCOUNTER — Inpatient Hospital Stay: Admitting: Pulmonary Disease

## 2024-07-15 ENCOUNTER — Inpatient Hospital Stay: Admitting: Infectious Disease

## 2024-07-16 ENCOUNTER — Encounter: Payer: Self-pay | Admitting: Internal Medicine

## 2024-07-16 ENCOUNTER — Inpatient Hospital Stay: Attending: Hematology and Oncology

## 2024-07-16 ENCOUNTER — Inpatient Hospital Stay (HOSPITAL_BASED_OUTPATIENT_CLINIC_OR_DEPARTMENT_OTHER): Admitting: Hematology and Oncology

## 2024-07-16 ENCOUNTER — Inpatient Hospital Stay

## 2024-07-16 VITALS — BP 121/46 | HR 72 | Temp 98.7°F | Resp 16 | Ht 62.0 in | Wt 112.0 lb

## 2024-07-16 DIAGNOSIS — Z5112 Encounter for antineoplastic immunotherapy: Secondary | ICD-10-CM | POA: Diagnosis present

## 2024-07-16 DIAGNOSIS — C50912 Malignant neoplasm of unspecified site of left female breast: Secondary | ICD-10-CM

## 2024-07-16 DIAGNOSIS — Z1732 Human epidermal growth factor receptor 2 negative status: Secondary | ICD-10-CM | POA: Diagnosis not present

## 2024-07-16 DIAGNOSIS — Z171 Estrogen receptor negative status [ER-]: Secondary | ICD-10-CM

## 2024-07-16 DIAGNOSIS — C7951 Secondary malignant neoplasm of bone: Secondary | ICD-10-CM | POA: Insufficient documentation

## 2024-07-16 DIAGNOSIS — Z170 Estrogen receptor positive status [ER+]: Secondary | ICD-10-CM | POA: Diagnosis not present

## 2024-07-16 DIAGNOSIS — C787 Secondary malignant neoplasm of liver and intrahepatic bile duct: Secondary | ICD-10-CM | POA: Diagnosis not present

## 2024-07-16 DIAGNOSIS — C50212 Malignant neoplasm of upper-inner quadrant of left female breast: Secondary | ICD-10-CM | POA: Insufficient documentation

## 2024-07-16 DIAGNOSIS — C7931 Secondary malignant neoplasm of brain: Secondary | ICD-10-CM | POA: Insufficient documentation

## 2024-07-16 DIAGNOSIS — Z1721 Progesterone receptor positive status: Secondary | ICD-10-CM | POA: Insufficient documentation

## 2024-07-16 LAB — FUNGUS CULTURE WITH STAIN

## 2024-07-16 LAB — CBC WITH DIFFERENTIAL (CANCER CENTER ONLY)
Abs Immature Granulocytes: 0.04 K/uL (ref 0.00–0.07)
Basophils Absolute: 0.1 K/uL (ref 0.0–0.1)
Basophils Relative: 1 %
Eosinophils Absolute: 0.8 K/uL — ABNORMAL HIGH (ref 0.0–0.5)
Eosinophils Relative: 7 %
HCT: 31 % — ABNORMAL LOW (ref 36.0–46.0)
Hemoglobin: 9.8 g/dL — ABNORMAL LOW (ref 12.0–15.0)
Immature Granulocytes: 0 %
Lymphocytes Relative: 18 %
Lymphs Abs: 2.1 K/uL (ref 0.7–4.0)
MCH: 26.2 pg (ref 26.0–34.0)
MCHC: 31.6 g/dL (ref 30.0–36.0)
MCV: 82.9 fL (ref 80.0–100.0)
Monocytes Absolute: 0.7 K/uL (ref 0.1–1.0)
Monocytes Relative: 6 %
Neutro Abs: 7.9 K/uL — ABNORMAL HIGH (ref 1.7–7.7)
Neutrophils Relative %: 68 %
Platelet Count: 359 K/uL (ref 150–400)
RBC: 3.74 MIL/uL — ABNORMAL LOW (ref 3.87–5.11)
RDW: 19.6 % — ABNORMAL HIGH (ref 11.5–15.5)
WBC Count: 11.7 K/uL — ABNORMAL HIGH (ref 4.0–10.5)
nRBC: 0 % (ref 0.0–0.2)

## 2024-07-16 LAB — FUNGAL ORGANISM REFLEX

## 2024-07-16 LAB — FUNGUS CULTURE RESULT

## 2024-07-16 MED ORDER — ACETAMINOPHEN 325 MG PO TABS
650.0000 mg | ORAL_TABLET | Freq: Once | ORAL | Status: AC
  Administered 2024-07-16: 650 mg via ORAL
  Filled 2024-07-16: qty 2

## 2024-07-16 MED ORDER — SODIUM CHLORIDE 0.9 % IV SOLN: Freq: Once | INTRAVENOUS | Status: AC

## 2024-07-16 MED ORDER — TRASTUZUMAB-ANNS CHEMO 150 MG IV SOLR
6.0000 mg/kg | Freq: Once | INTRAVENOUS | Status: AC
  Administered 2024-07-16: 300 mg via INTRAVENOUS
  Filled 2024-07-16: qty 14.29

## 2024-07-16 MED ORDER — DIPHENHYDRAMINE HCL 25 MG PO CAPS
25.0000 mg | ORAL_CAPSULE | Freq: Once | ORAL | Status: AC
  Administered 2024-07-16: 25 mg via ORAL
  Filled 2024-07-16: qty 1

## 2024-07-16 NOTE — Progress Notes (Signed)
 "  Patient Care Team: [name], MD as PCP - General (Internal Medicine) Van Knee, MD as Referring Physician (Emergency Medicine)  DIAGNOSIS: No diagnosis found.  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 Ki-67 was 70% HER-2 was amplified with a ratio 2.91   07/12/2012 - 07/17/2013 Neo-Adjuvant Chemotherapy   TCH 6 followed by Herceptin  maintenance   08/14/2012 Genetic Testing   Negative genetic testing on the [facility].  The report date is August 14, 2012.  The [facility] gene panel offered by [facility] includes sequencing and deletion/duplication testing of the following 25 genes: APC, ATM, , BARD1, BMPR1A, BRCA1, BRCA2, BRIP1, CHD1, CDK4, CDKN2A, CHEK2, EPCAM (large rearrangement only), MLH1, MSH2, MSH6, MUTYH, NBN, PALB2, PMS2, PTEN, RAD51C, RAD51D, SMAD4, STK11, and TP53.     12/11/2012 Surgery   Left breast lumpectomy: 1.8 cm tumor 1 positive sentinel node, axillary lymph node dissection 02/08/2013 showed 0/13 lymph nodes   03/25/2013 - 05/06/2013 Radiation Therapy   Adjuvant radiation therapy   06/05/2013 - 07/20/2017 Anti-estrogen oral therapy   Tamoxifen  20 mg daily   07/27/2017 Relapse/Recurrence   MRI Brain: 3.4 x 2.9 x 2.9 cm RIGHT frontal lobe mass with imaging characteristics of solitary metastasis. Extensive vasogenic edema resulting in 9 mm RIGHT to LEFT midline shift. Equivocal very early LEFT ventricle entrapment.    08/04/2017 Surgery   Rt frontal brain resection: Poorly differentiated tumor IHC suggests breast primary ER and PR Positive   08/25/2017 - 09/04/2017 Radiation Therapy   Stereotactic radiation   09/18/2017 -  Anti-estrogen oral therapy    Lapatinib  with letrozole    12/18/2017 - 12/19/2017 Radiation Therapy   New right parietal lobe metastases status post [facility]   08/27/2018 - 08/27/2020 Radiation Therapy   SRS to new brain metastases   05/09/2019 Relapse/Recurrence   Interval increase in size of the enhancing nodular left internal mammary soft tissue 2.8 cm.  Redemonstrated enlarged supraclavicular, lower cervical and lower posterior cervical nodes unchanged.  Interval increase in the bony erosion of the posterior and lateral left third rib, increasing soft tissue lesion eroding the left sternal body 3.1 cm was 2.5 cm.  Bronchiectatic changes   06/19/2019 - 05/05/2020 Chemotherapy   ado-trastuzumab emtansine  (KADCYLA )     06/15/2020 -  Chemotherapy   Xeloda , Tucatinib , Herceptin     04/12/2022 - 09/06/2022 Chemotherapy   Patient is on Treatment Plan : BREAST Trastuzumab  IV (8/6) or SQ (600) D1 q21d     09/27/2022 -  Chemotherapy   Patient is on Treatment Plan : BREAST MAINTENANCE Trastuzumab  IV every 3 weeks (21 days)     11/08/2022 Miscellaneous   Adding low-dose neratinib  (80 mg) to Herceptin    12/20/2022 Cancer Staging   Staging form: Breast, AJCC 7th Edition - Pathologic: Stage IV (M1) - Signed by Crawford Morna Pickle, NP on 12/20/2022   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 /Initial Ca 1997, Brain Mets 2019  06/10/2019 Initial Diagnosis   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)   06/19/2019 - 05/05/2020 Chemotherapy   ado-trastuzumab emtansine  (KADCYLA )     09/27/2022 -  Chemotherapy   Patient is on Treatment Plan : BREAST MAINTENANCE Trastuzumab  IV every 3 weeks (21 days)     Port-A-Cath  in place    CHIEF COMPLIANT: Follow-up on Herceptin   HISTORY OF PRESENT ILLNESS:  History of Present Illness Melissa [name] is a 59 year old female with metastatic HER2-positive, 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  She was recently hospitalized for pseudomonas pneumonia presenting with profound fatigue, decreased energy, and hypersomnolence, including falling asleep while eating. She was dehydrated and received intravenous fluids. CT showed diffuse pulmonary opacities. She is on a 25-day antibiotic course with about 25 days remaining. She moved her work area from a damp basement to an upstairs room to reduce environmental exposure, without visible mold noted.  A follow-up CT prior to this visit showed resolution of patchy ground glass opacities, with persistent bronchiectasis and stable pulmonary nodules compared with prior imaging.  During hospitalization, her hemoglobin nadired at 7.8 g/dL and improved to 9.8 g/dL without transfusion. White blood cell count and platelets are within normal limits.      ALLERGIES:  is allergic to aspirin, protonix  [pantoprazole ], doxycycline , promethazine -codeine , sulfamethoxazole-trimethoprim , and iodinated contrast media.  MEDICATIONS:  Current Outpatient Medications  Medication Sig Dispense Refill   acetaminophen  (TYLENOL ) 325 MG tablet Take 2 tablets (650 mg total) by mouth every 6 (six) hours as needed for mild pain (or Fever >/= 101). 12 tablet 0   albuterol  (VENTOLIN  HFA) 108 (90 Base) MCG/ACT inhaler Inhale 2 puffs into the lungs every 6 (six) hours as needed for wheezing or shortness of breath (cough). 18 g 5   chlorpheniramine -HYDROcodone  (TUSSIONEX) 10-8 MG/5ML Take 5 mLs by mouth 2 (two) times daily. 300 mL 0   ciprofloxacin  (CIPRO ) 750 MG tablet Take 1 tablet (750 mg total) by mouth 2 (two) times daily for 57 doses. 57 tablet 0   dexamethasone  (DECADRON ) 2 MG tablet Take 1 tablet (2 mg total) by mouth daily. 90 tablet 0   diphenhydrAMINE  (BENADRYL ) 50 MG tablet Take 50 mg of Benadryl  on 05/22/24 at 1:15 pm with the last dose of Prednisone  50 mg . Please call [phone] with any questions. 1  tablet 0   escitalopram  (LEXAPRO ) 10 MG tablet Take 10 mg by mouth daily.     fluticasone -salmeterol (WIXELA INHUB) 250-50 MCG/ACT AEPB Inhale 1 puff into the lungs in the morning and at bedtime. 60 each 6   levalbuterol  (XOPENEX ) 0.63 MG/3ML nebulizer solution Take 3 mLs (0.63 mg total) by nebulization every 4 (four) hours as needed for wheezing or shortness of breath. 90 mL 12   levETIRAcetam  (KEPPRA ) 250 MG tablet Take 1 tablet (250 mg total) by mouth 2 (two) times daily. 60 tablet 3   lidocaine -prilocaine  (EMLA ) cream APPLY EXTERNALLY TO THE AFFECTED AREA 1 TIME 30 g 3   Multiple Vitamins-Minerals (EMERGEN-C VITAMIN C PO) Take 1 tablet by mouth daily.     naproxen sodium (ALEVE) 220 MG tablet Take 220 mg by mouth daily as needed (for pain).     ondansetron  (ZOFRAN -ODT) 4 MG disintegrating tablet Take 4 mg by mouth every 8 (eight) hours as needed for vomiting or nausea.     No current facility-administered medications for this visit.   Facility-Administered Medications Ordered in Other Visits  Medication Dose Route Frequency Provider Last Rate Last Admin   alteplase  (CATHFLO ACTIVASE ) injection 2 mg  2 mg Intracatheter Once PRN Odean Potts, MD       sodium chloride  flush (NS) 0.9 % injection 10 mL  10 mL Intracatheter PRN Odean Potts,  MD   10 mL at 07/06/21 1428    PHYSICAL EXAMINATION: ECOG PERFORMANCE STATUS: 1 - Symptomatic but completely ambulatory  Vitals:   07/16/24 1341  BP: (!) 121/46  Pulse: 72  Resp: 16  Temp: 98.7 F (37.1 C)  SpO2: 98%   Filed Weights   07/16/24 1341  Weight: 112 lb (50.8 kg)     LABORATORY DATA:  I have reviewed the data as listed    Latest Ref Rng & Units 06/22/2024    3:19 AM 06/21/2024    2:59 AM 06/20/2024    3:44 AM  CMP  Glucose 70 - 99 mg/dL 82  93  887   BUN 6 - 20 mg/dL 6  6  8    Creatinine 0.44 - 1.00 mg/dL 9.25  9.28  9.44   Sodium 135 - 145 mmol/L 137  138  135   Potassium 3.5 - 5.1 mmol/L 3.8  3.6  4.2   Chloride 98 -  111 mmol/L 102  108  104   CO2 22 - 32 mmol/L 28  23  24    Calcium 8.9 - 10.3 mg/dL 8.6  8.5  8.6   Total Protein 6.5 - 8.1 g/dL   6.2   Total Bilirubin 0.0 - 1.2 mg/dL   0.5   Alkaline Phos 38 - 126 U/L   71   AST 15 - 41 U/L   13   ALT 0 - 44 U/L   17     Lab Results  Component Value Date   WBC 11.7 (H) 07/16/2024   HGB 9.8 (L) 07/16/2024   HCT 31.0 (L) 07/16/2024   MCV 82.9 07/16/2024   PLT 359 07/16/2024   NEUTROABS 7.9 (H) 07/16/2024    ASSESSMENT & PLAN:  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  SRS brain: 08/25/2017-09/04/2017 2. Anti Her 2 therapy with Lapatinib  started 09/17/2017-05/14/2019: Stopped for progression 3.  I discontinued tamoxifen  and started her on letrozole  2.5 mg daily.  05/14/2019 stopped for progression 4.  Stereotactic radiosurgery 12/19/2017 to the new right parietal lobe metastases. 5.  Kadcyla : Received 16 cycles discontinued 05/05/2020 6.  Tucatinib  Xeloda : Discontinued because of hand-foot syndrome 06/11/2020-12/15/2020 7. Herceptin /Neratinib : unable to tolerate due to diarrhea and weight loss 8.  SRS to new brain mets 10/01/2021 9.  Resection of brain metastases at [facility] 12/29/2022: Metastatic breast cancer proc panel pending 10.  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  Follows with pulmonary  Hospitalization 06/19/2024-06/22/2024: Pseudomonas sepsis CT chest 07/14/2024: Improvement of the ground glass opacities  Return to clinic every 3 weeks for Herceptin  and every 6 weeks for follow-up with us     No orders of the defined types were placed in this encounter.  The patient has a good understanding of the overall plan. she agrees with it. she will call with any problems that may develop before the next visit here.  I personally spent a total of 30 minutes in the care of the patient today including preparing to see the patient, getting/reviewing separately obtained history, performing a medically appropriate exam/evaluation, counseling and educating, placing orders, referring and communicating with other health care professionals, documenting clinical information in the EHR, independently interpreting results, communicating results, and coordinating care.   Viinay K Bengie Kaucher, MD 07/16/2024    "

## 2024-07-16 NOTE — Patient Instructions (Signed)

## 2024-07-16 NOTE — Progress Notes (Signed)
 No need to reload Kanjinti  today per Dr. Odean.  Bryne Lindon, PharmD, MBA

## 2024-07-16 NOTE — Assessment & Plan Note (Signed)
 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  SRS brain: 08/25/2017-09/04/2017 2. Anti Her 2 therapy with Lapatinib  started 09/17/2017-05/14/2019: Stopped for progression 3.  I discontinued tamoxifen  and started her on letrozole  2.5 mg daily.  05/14/2019 stopped for progression 4.  Stereotactic radiosurgery 12/19/2017 to the new right parietal lobe metastases. 5.  Kadcyla : Received 16 cycles discontinued 05/05/2020 6.  Tucatinib  Xeloda : Discontinued because of hand-foot syndrome 06/11/2020-12/15/2020 7. Herceptin /Neratinib : unable to tolerate due to diarrhea and weight loss 8.  SRS to new brain mets 10/01/2021 9.  Resection of brain metastases at [facility] 12/29/2022: Metastatic breast cancer proc panel pending 10.  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  Follows with pulmonary  Hospitalization 06/19/2024-06/22/2024: Pseudomonas sepsis  Return to clinic every 3 weeks for Herceptin  and every 6 weeks for follow-up with us 

## 2024-07-17 ENCOUNTER — Ambulatory Visit: Admitting: Pulmonary Disease

## 2024-07-17 ENCOUNTER — Encounter: Payer: Self-pay | Admitting: Pulmonary Disease

## 2024-07-17 VITALS — BP 101/67 | HR 70 | Temp 97.7°F | Resp 18 | Ht 62.0 in | Wt 115.6 lb

## 2024-07-17 DIAGNOSIS — J984 Other disorders of lung: Secondary | ICD-10-CM | POA: Diagnosis not present

## 2024-07-17 DIAGNOSIS — J151 Pneumonia due to Pseudomonas: Secondary | ICD-10-CM

## 2024-07-17 DIAGNOSIS — J479 Bronchiectasis, uncomplicated: Secondary | ICD-10-CM

## 2024-07-17 DIAGNOSIS — F17210 Nicotine dependence, cigarettes, uncomplicated: Secondary | ICD-10-CM

## 2024-07-17 DIAGNOSIS — J471 Bronchiectasis with (acute) exacerbation: Secondary | ICD-10-CM | POA: Diagnosis not present

## 2024-07-17 NOTE — Progress Notes (Signed)
 "  @Patient  ID: Melissa [name], female    DOB: 1965-09-20, 59 y.o.   MRN: [id number]  Chief Complaint  Patient presents with   Follow-up    Doing ok    Referring provider: [name], MD  HPI:   59 y.o. woman with history of breast cancer with intracranial metastasis status post recent radiation and on a Avastin  therapy as well as background tamoxifen  with history of fungal infections status post left upper lobe lobectomy, concern for more recent aspergillosis on and off azole therapy currently off for some time with stable imaging findings, chronic left-sided bronchiectasis here for hospital follow-up following worsening acute cough status post bronchoscopy and subsequent hospitalization with pneumonia, Pseudomonas on BAL.  Multiple hospital notes reviewed.  Most recent pulmonary office note reviewed.  Most recent oncology office note reviewed.  At last visit worsening cough.  Given her history of concern for Aspergillus as well as significant bronchiectasis and cavitary lung disease, we agreed to move forward with bronchoscopy with BAL to establish pathogen.  This was performed.  BAL isolate with pansensitive Pseudomonas.  Prior to developing treatment plan she worsened.  Cough shortness of breath.  Admitted to the hospital.  With pneumonia.  Treated with IV antibiotics tailored towards Pseudomonas.  Discharged on prolonged course of ciprofloxacin .  About 1 month.  Is about to finish.  Cough is much better.  Compared to last office visit.  And certainly from pneumonia.  This helped significantly.  CT scan 06/2024 with fluid-filled cavity, superinfected versus post bronchoscopic changes, BAL changes.  Repeat CT scan 07/2024 with improvement in fluid level.  HPI at initial visit: Patient last in clinic in 2022.  Lost to follow-up.  Has had serial CT scans given her history of breast cancer.  For several years has demonstrated significant bronchiectatic changes, cavitary lesion that looks like a  very large dilated bronchus on my review interpretation, relatively severe bronchiectasis on the left, and scattered emphysematous changes with waxing and waning air-fluid levels in the cavities.  Recently it was discovered she had intracranial metastasis.  She is undergone radiation therapy.  She started on a Avastin  about 9 weeks ago, has received 3 infusions.  She states cough worsened shortly after initiation of a Avastin .  Over the last few weeks she has had hemoptysis.  In the past she has had hemoptysis come and go.  But is pretty persistent over the last several weeks.  This prompted visit to the ED in [place] via [facility] system 04/09/2023 CT scan at that date is interpreted as infiltrate near prior cavitary lesion concerning for progression of aspergillosis.  I cannot review these images.  She endorses weight loss.  Some chills.  Recent voriconazole  level seems low but I am not quite sure how best to interpret it.    Questionaires / Pulmonary Flowsheets:   ACT:      No data to display          MMRC: mMRC Dyspnea Scale mMRC Score  03/05/2020  3:06 PM 1    Epworth:      No data to display          Tests:   FENO:  No results found for: NITRICOXIDE  PFT:    Latest Ref Rng & Units 08/09/2016    9:43 AM  PFT Results  FVC-Pre L 2.79   FVC-Predicted Pre % 83   FVC-Post L 2.92   FVC-Predicted Post % 87   Pre FEV1/FVC % % 69  Post FEV1/FCV % % 70   FEV1-Pre L 1.94   FEV1-Predicted Pre % 73   FEV1-Post L 2.05   DLCO uncorrected ml/min/mmHg 10.08   DLCO UNC% % 46   DLVA Predicted % 55   TLC L 4.75   TLC % Predicted % 100   RV % Predicted % 124   Personally reviewed interpreted as mild COPD, no significant bronchodilator response, lung volumes consistent with air trapping, DLCO severely reduced  WALK:     05/02/2018    3:51 PM  SIX MIN WALK  Supplimental Oxygen during Test? (L/min) No  Tech Comments: Fast paced with some SOB noted.    Imaging: Personally  reviewed and as per EMR discussion this note CT CHEST WO CONTRAST Result Date: 07/16/2024 CLINICAL DATA:  Bronchiectasis. EXAM: CT CHEST WITHOUT CONTRAST TECHNIQUE: Multidetector CT imaging of the chest was performed following the standard protocol without IV contrast. RADIATION DOSE REDUCTION: This exam was performed according to the departmental dose-optimization program which includes automated exposure control, adjustment of the mA and/or kV according to patient size and/or use of iterative reconstruction technique. COMPARISON:  06/19/2024 FINDINGS: Cardiovascular: The heart size is normal. No substantial pericardial effusion. No thoracic aortic aneurysm. Right Port-A-Cath tip is positioned in the mid SVC. Mediastinum/Nodes: No mediastinal lymphadenopathy. No evidence for gross hilar lymphadenopathy although assessment is limited by the lack of intravenous contrast on the current study. The esophagus has normal imaging features. There is no axillary lymphadenopathy. Surgical clips are noted in the left axillary region. Lungs/Pleura: Surgical changes in the left hemithorax compatible with the reported history of left upper lobectomy. Bronchiectasis with complex cavitary lesion in the left lung is not substantially changed in the interval although the fluid level in the cavitary process has decreased. Collapse/consolidative opacity is most prominent inferiorly and posteriorly, as before. Ill-defined clustered nodularity in the anterior left lung is also similar to prior. Layering small left pleural effusion is similar to prior study. Several scattered tiny right lung nodules are similar to prior. No right-sided pleural effusion. Upper Abdomen: Visualized portion of the upper abdomen shows no acute findings. Musculoskeletal: No worrisome lytic or sclerotic osseous abnormality. IMPRESSION: 1. Patchy ground-glass lung opacity seen previously has resolved in the interval. 2. Surgical changes in the left hemithorax  compatible with the reported history of left upper lobectomy. 3. Bronchiectasis with complex cavitary lesion in the left lung is not substantially changed in the interval although the fluid level in the cavitary process has decreased. Collapse/consolidative opacity is most prominent inferiorly and posteriorly, as before. 4. Ill-defined clustered nodularity in the anterior left lung is also similar to prior. 5. Layering small left pleural effusion is similar to prior study. 6. Several scattered tiny right lung nodules are similar to prior. Electronically Signed   By: Camellia Candle M.D.   On: 07/16/2024 07:53   CT Chest Wo Contrast Result Date: 06/19/2024 CLINICAL DATA:  Left upper lobe cavitary lesion and hydropneumothorax. EXAM: CT CHEST WITHOUT CONTRAST TECHNIQUE: Multidetector CT imaging of the chest was performed following the standard protocol without IV contrast. RADIATION DOSE REDUCTION: This exam was performed according to the departmental dose-optimization program which includes automated exposure control, adjustment of the mA and/or kV according to patient size and/or use of iterative reconstruction technique. COMPARISON:  Chest radiograph dated 06/19/2024 and CT dated 05/22/2024. FINDINGS: Evaluation of this exam is limited in the absence of intravenous contrast. Cardiovascular: There is no cardiomegaly or pericardial effusion. The thoracic aorta and central pulmonary arteries  are grossly unremarkable. Right-sided Port-A-Cath with tip in the central SVC close to the cavoatrial junction. Mediastinum/Nodes: No obvious hilar adenopathy. Cardiopulmonary window lymph nodes measure up to 8 mm. The esophagus is grossly unremarkable. No mediastinal fluid collection. Lungs/Pleura: Postsurgical changes of the left upper lobectomy. There is bronchiectasis of the left lower lobe. Large cavitary space in the left lung with air-fluid level. Slight interval increase in the size of the fluid content within the  cavitation since the prior CT. The upper cavitary component measures approximately 12 x 6 cm in greatest axial dimensions (previously 11 x 5 cm). There is diffuse ground-glass nodular density throughout the lungs, new since the prior CT most consistent with an infectious process, likely atypical infection. The central airways are patent. Upper Abdomen: No acute abnormality. Musculoskeletal: Degenerative changes. No acute osseous pathology. Left axillary surgical clips. IMPRESSION: 1. Postsurgical changes of the left upper lobectomy. 2. Large cavitary space in the left lung with air-fluid level. Slight interval increase of the fluid content within the cavitation since the prior CT. 3. Diffuse ground-glass nodular density throughout the lungs, new since the prior CT most consistent with an infectious process, likely atypical infection. Electronically Signed   By: Vanetta Chou M.D.   On: 06/19/2024 15:01   DG Chest 2 View Result Date: 06/19/2024 EXAM: 2 VIEW(S) XRAY OF THE CHEST 06/19/2024 11:49:00 AM COMPARISON: 06/14/2024 CLINICAL HISTORY: Fatigue FINDINGS: LINES, TUBES AND DEVICES: Right chest Port-A-Cath in place with tip overlying the expected region of the superior cavoatrial junction, stable. LUNGS AND PLEURA: Chronic volume loss in the left hemithorax. Cavitary lesion in the left upper lobe with associated loculated hydropneumothorax. Elevated left hemidiaphragm. HEART AND MEDIASTINUM: No acute abnormality of the cardiac and mediastinal silhouettes. BONES AND SOFT TISSUES: Left axillary surgical clips noted. No acute osseous abnormality. IMPRESSION: 1. Cavitary lesion in the left upper lobe with associated loculated hydropneumothorax. 2. Chronic volume loss in the left hemithorax and elevated left hemidiaphragm. Electronically signed by: Evalene Coho MD 06/19/2024 12:08 PM EST RP Workstation: [id number]   ECHOCARDIOGRAM COMPLETE Result Date: 06/17/2024    ECHOCARDIOGRAM REPORT   Patient Name:    KYLEENA SCHEIRER [facility] Date of Exam: 06/17/2024 Medical Rec #:  [id number]     Height:       62.0 in Accession #:    [id number]    Weight:       113.0 lb Date of Birth:  1965-08-10     BSA:          1.500 m Patient Age:    58 years      BP:           98/58 mmHg Patient Gender: F             HR:           104 bpm. Exam Location:  Outpatient Procedure: 2D Echo and Strain Analysis (Both Spectral and Color Flow Doppler            were utilized during procedure). Indications:    Chemo  History:        Patient has prior history of Echocardiogram examinations.                 Signs/Symptoms:Chemo.  Sonographer:    Norleen Amour Referring Phys: [id number] LINDSEY CORNETTO CAUSEY IMPRESSIONS  1. Left ventricular ejection fraction, by estimation, is 60 to 65%. The left ventricle has normal function. The left ventricle has no regional wall motion abnormalities. Left ventricular diastolic parameters are  consistent with Grade I diastolic dysfunction (impaired relaxation). The average left ventricular global longitudinal strain is -24.3 %. The global longitudinal strain is normal.  2. Right ventricular systolic function is normal. The right ventricular size is normal. There is normal pulmonary artery systolic pressure.  3. The mitral valve is normal in structure. No evidence of mitral valve regurgitation. No evidence of mitral stenosis.  4. The aortic valve is tricuspid. Aortic valve regurgitation is not visualized. No aortic stenosis is present.  5. The inferior vena cava is normal in size with greater than 50% respiratory variability, suggesting right atrial pressure of 3 mmHg. FINDINGS  Left Ventricle: Left ventricular ejection fraction, by estimation, is 60 to 65%. The left ventricle has normal function. The left ventricle has no regional wall motion abnormalities. The average left ventricular global longitudinal strain is -24.3 %. Strain was performed and the global longitudinal strain is normal. The left ventricular internal cavity size was  normal in size. There is no left ventricular hypertrophy. Left ventricular diastolic parameters are consistent with Grade I diastolic dysfunction (impaired relaxation). Normal left ventricular filling pressure. Right Ventricle: The right ventricular size is normal. No increase in right ventricular wall thickness. Right ventricular systolic function is normal. There is normal pulmonary artery systolic pressure. The tricuspid regurgitant velocity is 2.43 m/s, and  with an assumed right atrial pressure of 3 mmHg, the estimated right ventricular systolic pressure is 26.6 mmHg. Left Atrium: Left atrial size was normal in size. Right Atrium: Right atrial size was normal in size. Pericardium: There is no evidence of pericardial effusion. Mitral Valve: The mitral valve is normal in structure. No evidence of mitral valve regurgitation. No evidence of mitral valve stenosis. MV peak gradient, 1.5 mmHg. The mean mitral valve gradient is 1.0 mmHg. Tricuspid Valve: The tricuspid valve is normal in structure. Tricuspid valve regurgitation is trivial. No evidence of tricuspid stenosis. Aortic Valve: The aortic valve is tricuspid. Aortic valve regurgitation is not visualized. No aortic stenosis is present. Aortic valve mean gradient measures 3.0 mmHg. Aortic valve peak gradient measures 5.0 mmHg. Aortic valve area, by VTI measures 2.13 cm. Pulmonic Valve: The pulmonic valve was normal in structure. Pulmonic valve regurgitation is not visualized. No evidence of pulmonic stenosis. Aorta: The aortic root is normal in size and structure. Venous: The inferior vena cava is normal in size with greater than 50% respiratory variability, suggesting right atrial pressure of 3 mmHg. IAS/Shunts: No atrial level shunt detected by color flow Doppler.  LEFT VENTRICLE PLAX 2D LVIDd:         3.90 cm     Diastology LVIDs:         2.70 cm     LV e' medial:    9.03 cm/s LV PW:         0.80 cm     LV E/e' medial:  5.5 LV IVS:        0.70 cm     LV e'  lateral:   8.81 cm/s LVOT diam:     1.80 cm     LV E/e' lateral: 5.7 LV SV:         43 LV SV Index:   29          2D Longitudinal Strain LVOT Area:     2.54 cm    2D Strain GLS (A4C):   -22.3 %  2D Strain GLS (A3C):   -30.8 %                            2D Strain GLS (A2C):   -19.8 % LV Volumes (MOD)           2D Strain GLS Avg:     -24.3 % LV vol d, MOD A2C: 36.5 ml LV vol d, MOD A4C: 37.7 ml LV vol s, MOD A2C: 11.8 ml LV vol s, MOD A4C: 11.1 ml LV SV MOD A2C:     24.7 ml LV SV MOD A4C:     37.7 ml LV SV MOD BP:      25.5 ml RIGHT VENTRICLE RV Basal diam:  2.90 cm     PULMONARY VEINS RV S prime:     10.80 cm/s  Diastolic Velocity: 50.10 cm/s TAPSE (M-mode): 1.5 cm      S/D Velocity:       1.40                             Systolic Velocity:  71.10 cm/s LEFT ATRIUM             Index        RIGHT ATRIUM          Index LA diam:        2.70 cm 1.80 cm/m   RA Area:     8.82 cm LA Vol (A2C):   27.0 ml 18.00 ml/m  RA Volume:   18.30 ml 12.20 ml/m LA Vol (A4C):   18.3 ml 12.20 ml/m LA Biplane Vol: 22.6 ml 15.07 ml/m  AORTIC VALVE                    PULMONIC VALVE AV Area (Vmax):    2.48 cm     PV Vmax:       0.99 m/s AV Area (Vmean):   2.17 cm     PV Peak grad:  3.9 mmHg AV Area (VTI):     2.13 cm AV Vmax:           112.00 cm/s AV Vmean:          81.700 cm/s AV VTI:            0.202 m AV Peak Grad:      5.0 mmHg AV Mean Grad:      3.0 mmHg LVOT Vmax:         109.00 cm/s LVOT Vmean:        69.700 cm/s LVOT VTI:          0.169 m LVOT/AV VTI ratio: 0.84  AORTA Ao Root diam: 2.90 cm Ao Asc diam:  2.60 cm MITRAL VALVE               TRICUSPID VALVE MV Area (PHT): 4.19 cm    TR Peak grad:   23.6 mmHg MV Area VTI:   3.73 cm    TR Vmax:        243.00 cm/s MV Peak grad:  1.5 mmHg MV Mean grad:  1.0 mmHg    SHUNTS MV Vmax:       0.62 m/s    Systemic VTI:  0.17 m MV Vmean:      41.8 cm/s   Systemic Diam: 1.80 cm MV Decel Time: 181 msec MV E velocity: 50.10 cm/s MV A velocity: 75.40 cm/s  MV E/A  ratio:  0.66 Annabella Scarce MD Electronically signed by Annabella Scarce MD Signature Date/Time: 06/17/2024/3:48:03 PM    Final     Lab Results: Personally reviewed CBC    Component Value Date/Time   WBC 11.7 (H) 07/16/2024 1312   WBC 11.0 (H) 06/22/2024 0319   RBC 3.74 (L) 07/16/2024 1312   HGB 9.8 (L) 07/16/2024 1312   HGB 13.1 08/12/2014 0955   HCT 31.0 (L) 07/16/2024 1312   HCT 40.4 08/12/2014 0955   PLT 359 07/16/2024 1312   PLT 228 08/12/2014 0955   MCV 82.9 07/16/2024 1312   MCV 98.2 08/12/2014 0955   MCH 26.2 07/16/2024 1312   MCHC 31.6 07/16/2024 1312   RDW 19.6 (H) 07/16/2024 1312   RDW 13.0 08/12/2014 0955   LYMPHSABS 2.1 07/16/2024 1312   LYMPHSABS 2.0 08/12/2014 0955   MONOABS 0.7 07/16/2024 1312   MONOABS 0.5 08/12/2014 0955   EOSABS 0.8 (H) 07/16/2024 1312   EOSABS 0.2 08/12/2014 0955   BASOSABS 0.1 07/16/2024 1312   BASOSABS 0.0 08/12/2014 0955    BMET    Component Value Date/Time   NA 137 06/22/2024 0319   NA 142 08/12/2014 0956   K 3.8 06/22/2024 0319   K 4.6 08/12/2014 0956   CL 102 06/22/2024 0319   CL 100 12/20/2012 1509   CO2 28 06/22/2024 0319   CO2 26 08/12/2014 0956   GLUCOSE 82 06/22/2024 0319   GLUCOSE 94 08/12/2014 0956   GLUCOSE 98 12/20/2012 1509   BUN 6 06/22/2024 0319   BUN 10.1 08/12/2014 0956   CREATININE 0.74 06/22/2024 0319   CREATININE 0.61 04/02/2024 0834   CREATININE 0.83 03/28/2023 1051   CREATININE 0.9 08/12/2014 0956   CALCIUM 8.6 (L) 06/22/2024 0319   CALCIUM 9.3 08/12/2014 0956   GFRNONAA >60 06/22/2024 0319   GFRNONAA >60 04/02/2024 0834   GFRNONAA 76 03/11/2019 1057   GFRAA >60 03/24/2020 1012   GFRAA >60 02/11/2020 0836   GFRAA 88 03/11/2019 1057    BNP No results found for: BNP  ProBNP No results found for: PROBNP  Specialty Problems       Pulmonary Problems   Obstructive bronchiectasis (HCC)   Status post left upper lobectomy for ? [place]  1997 CT chest   01/26/2011 Focal  bronchiectasis / scarring medially in the left lung apex with  surrounding inflammatory changes  - Pneumovax 08/12/11  And prevnar 08/09/2016  - PFT's  08/09/2016  FEV1 2.05 (77 % ) ratio 70  p 5 % improvement from saba p dulera  100 x2  prior to study with DLCO  46 % corrects to 55  % for alv volume   - alpha one screening 03/27/2018  MM 267  - FOB/BAL 05/23/18 Pos Aspergillus > ID following -  12/13/2018  After extensive coaching inhaler device,  effectiveness =    75% (short ti)        Upper airway cough syndrome   Max rx for gerd trial basis 08/09/2016 >>>         Emphysema lung (HCC)   Cavitary pneumonia   New since CT 01/18/18  assoc with acute onset L axillary cp 03/28/18 in area of previous blebs s a/f level c/w infected bleb rx augmentin  x 10 days > improved at ov 04/11/2018 and 10 more days augmentin  given - Quant TB 03/27/2018 neg  - ID eval 04/09/18 fungal serologies sent > neg  - 04/11/2018 rec another 10 days augmentin  for ? Abscess forming  LUL  - Following with ID ? Start fluconazole  if not improving. Req records be sent to MD who treated [place] fever in '97   - CT 04/26/18 > Overall, there has been interval worsening in lung disease on the Left  - FOB Dr Karlis 05/24/19 with BAL pos for aspergillus > see ID rx          DOE (dyspnea on exertion)   05/02/2018  Walked RA x 3 laps @ 185 ft each stopped due to  End of study, fast pace, no   desat - min sob         Chronic pulmonary aspergillosis (HCC)   Hemoptysis    Allergies  Allergen Reactions   Aspirin Anaphylaxis and Shortness Of Breath    THROAT CLOSES   Protonix  [Pantoprazole ] Nausea Only and Other (See Comments)    Also caused a film in the mouth and caused chest pressure   Doxycycline  Other (See Comments)    Hallucinations, headaches   Promethazine -Codeine  Cough    Worsening cough and insomnia    Sulfamethoxazole-Trimethoprim  Other (See Comments)    unknown   Iodinated Contrast Media Rash    Patient allergic to  contrast used in radiation oncology for CT simulation     Immunization History  Administered Date(s) Administered   Influenza Split 05/03/2011   Influenza Whole 03/11/2012   Influenza, Seasonal, Injecte, Preservative Fre 05/08/2023   Influenza,inj,Quad PF,6+ Mos 07/29/2017, 04/08/2019, 04/20/2020, 05/24/2021   Influenza-Unspecified 04/10/2022   PNEUMOCOCCAL CONJUGATE-20 08/18/2021   Pneumococcal Conjugate-13 08/09/2016   Pneumococcal-Unspecified 08/12/2011    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)    2019   Cancer, metastatic to liver (HCC)    2021   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)    Coccidioidomycosis 11/05/2023   Complication of anesthesia    bp dropped + desat    COPD (chronic obstructive pulmonary disease) (HCC)    Dyspnea    DOE   GERD (gastroesophageal reflux disease)    H/O coccidioidomycosis    was reason for lung lobectomy   Headache(784.0)    due to eye strain or not eating   Hemoptysis 05/08/2023   History of anemia    no current problem   History of asthma    as a child   History of breast cancer 2014   left   History of chemotherapy    finished 07/17/2013   History of hiatal hernia    AGE 64   Hx of radiation therapy 03/25/13-05/06/13   left breast 5000 cGy/25 sessions, left breast boost 1000 cGy/5 sessions   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   Personal history of chemotherapy    for liver cancer   Personal history of radiation therapy    Pneumonia    LAST FLARE UP 01/2018   Rash 02/22/2021   Runny nose 07/30/2013   clear drainage   Smoker 05/24/2021   Wears dentures    upper    Tobacco History: Social History   Tobacco Use  Smoking Status Every Day   Current packs/day: 0.30   Average packs/day: 0.3 packs/day  for 38.0 years (11.4 ttl pk-yrs)   Types: Cigarettes  Smokeless Tobacco Never  Tobacco Comments   1 pack a day 06/10/2024 KRD   Ready to quit: Not Answered Counseling given: Not Answered Tobacco comments: 1 pack a day 06/10/2024 KRD   Continue to not smoke  Outpatient Encounter Medications as of 07/17/2024  Medication Sig   acetaminophen  (TYLENOL ) 325 MG tablet Take 2 tablets (650 mg total) by mouth every 6 (six) hours as needed for mild pain (or Fever >/= 101).   albuterol  (VENTOLIN  HFA) 108 (90 Base) MCG/ACT inhaler Inhale 2 puffs into the lungs every 6 (six) hours as needed for wheezing or shortness of breath (cough).   chlorpheniramine -HYDROcodone  (TUSSIONEX) 10-8 MG/5ML Take 5 mLs by mouth 2 (two) times daily.   ciprofloxacin  (CIPRO ) 750 MG tablet Take 1 tablet (750 mg total) by mouth 2 (two) times daily for 57 doses.   dexamethasone  (DECADRON ) 2 MG tablet Take 1 tablet (2 mg total) by mouth daily.   diphenhydrAMINE  (BENADRYL ) 50 MG tablet Take 50 mg of Benadryl  on 05/22/24 at 1:15 pm with the last dose of Prednisone  50 mg . Please call [phone] with any questions.   escitalopram  (LEXAPRO ) 10 MG tablet Take 10 mg by mouth daily.   fluticasone -salmeterol (WIXELA INHUB) 250-50 MCG/ACT AEPB Inhale 1 puff into the lungs in the morning and at bedtime.   levalbuterol  (XOPENEX ) 0.63 MG/3ML nebulizer solution Take 3 mLs (0.63 mg total) by nebulization every 4 (four) hours as needed for wheezing or shortness of breath.   levETIRAcetam  (KEPPRA ) 250 MG tablet Take 1 tablet (250 mg total) by mouth 2 (two) times daily.   lidocaine -prilocaine  (EMLA ) cream APPLY EXTERNALLY TO THE AFFECTED AREA 1 TIME   Multiple Vitamins-Minerals (EMERGEN-C VITAMIN C PO) Take 1 tablet by mouth daily.   naproxen sodium (ALEVE) 220 MG tablet Take 220 mg by mouth daily as needed (for pain).   ondansetron  (ZOFRAN -ODT) 4 MG disintegrating tablet Take 4 mg by mouth every 8 (eight) hours as needed for vomiting or nausea.    Facility-Administered Encounter Medications as of 07/17/2024  Medication   alteplase  (CATHFLO ACTIVASE ) injection 2 mg   sodium chloride  flush (NS) 0.9 % injection 10 mL     Review of Systems  Review of Systems  N/a Physical Exam  BP 101/67 (BP Location: Right Arm, Patient Position: Sitting)   Pulse 70   Temp 97.7 F (36.5 C) (Oral)   Resp 18   Ht 5' 2 (1.575 m)   Wt 115 lb 9.6 oz (52.4 kg)   LMP 07/25/2012 Comment: pregnancy waiver form signed 01-18-2018  SpO2 97%   BMI 21.14 kg/m   Wt Readings from Last 5 Encounters:  07/17/24 115 lb 9.6 oz (52.4 kg)  07/16/24 112 lb (50.8 kg)  06/26/24 111 lb 12.8 oz (50.7 kg)  06/22/24 110 lb 14.3 oz (50.3 kg)  06/14/24 113 lb (51.3 kg)    BMI Readings from Last 5 Encounters:  07/17/24 21.14 kg/m  07/16/24 20.49 kg/m  06/26/24 20.45 kg/m  06/22/24 20.28 kg/m  06/14/24 20.67 kg/m     Physical Exam General: Sitting in chair no distress Eyes: No icterus Pulmonary: Normal work of breathing, bronchial breath sounds throughout on the left, more normal breath sounds on the right  Assessment & Plan:   Cavitary lung lesion/severely dilated bronchiectasis with exacerbation: Unfortunately,  recent increase size and left lower lobe cavity and air-fluid level as well as worsening cough and left-sided chest pain concerning for worsening disease.  BAL with pan sensitive Psa and required hospitalization for Pna after procedure.  Improving on prolonged 4-week course of Cipro .  Upcoming ID appointment.  Appreciate their assistance.  History of aspergillosis: Fungal elements negative on recent bronchoscopy.  Has follow-up with ID.  Bronchiectasis: Suspect related to chronic infections.  Prior lobectomy due to fungal infection.  Encouraged ongoing airway clearance regimen.     Return in about 3 months (around 10/15/2024) for f/u Dr. Annella.   Donnice JONELLE Annella, MD 07/17/2024    "

## 2024-07-17 NOTE — Patient Instructions (Signed)
 Nice to see you again  No changes in medication  I anticipate ciprofloxacin  will stop on the end date but I trust Dr. Fleeta Rothman if he wants to extend the course  Return to clinic in 3 months or sooner as needed with Dr. Annella

## 2024-07-18 ENCOUNTER — Other Ambulatory Visit: Payer: Self-pay

## 2024-07-18 ENCOUNTER — Ambulatory Visit
Admission: RE | Admit: 2024-07-18 | Discharge: 2024-07-18 | Disposition: A | Discharge status: Home / Self Care | Source: Ambulatory Visit | Attending: Internal Medicine | Admitting: Internal Medicine

## 2024-07-18 ENCOUNTER — Ambulatory Visit (INDEPENDENT_AMBULATORY_CARE_PROVIDER_SITE_OTHER): Admitting: Infectious Disease

## 2024-07-18 ENCOUNTER — Encounter: Payer: Self-pay | Admitting: Infectious Disease

## 2024-07-18 VITALS — BP 104/56 | HR 74 | Temp 97.2°F | Ht 62.0 in | Wt 115.0 lb

## 2024-07-18 DIAGNOSIS — J151 Pneumonia due to Pseudomonas: Secondary | ICD-10-CM | POA: Insufficient documentation

## 2024-07-18 DIAGNOSIS — C7931 Secondary malignant neoplasm of brain: Secondary | ICD-10-CM

## 2024-07-18 DIAGNOSIS — B389 Coccidioidomycosis, unspecified: Secondary | ICD-10-CM

## 2024-07-18 DIAGNOSIS — B449 Aspergillosis, unspecified: Secondary | ICD-10-CM

## 2024-07-18 DIAGNOSIS — J479 Bronchiectasis, uncomplicated: Secondary | ICD-10-CM | POA: Diagnosis not present

## 2024-07-18 DIAGNOSIS — Z171 Estrogen receptor negative status [ER-]: Secondary | ICD-10-CM | POA: Diagnosis not present

## 2024-07-18 DIAGNOSIS — F17200 Nicotine dependence, unspecified, uncomplicated: Secondary | ICD-10-CM | POA: Diagnosis not present

## 2024-07-18 DIAGNOSIS — C50912 Malignant neoplasm of unspecified site of left female breast: Secondary | ICD-10-CM

## 2024-07-18 DIAGNOSIS — C50212 Malignant neoplasm of upper-inner quadrant of left female breast: Secondary | ICD-10-CM

## 2024-07-18 DIAGNOSIS — Z7185 Encounter for immunization safety counseling: Secondary | ICD-10-CM | POA: Diagnosis not present

## 2024-07-18 HISTORY — DX: Bronchiectasis, uncomplicated: J47.9

## 2024-07-18 MED ORDER — GADOPICLENOL 0.5 MMOL/ML IV SOLN
5.0000 mL | Freq: Once | INTRAVENOUS | Status: AC | PRN
  Administered 2024-07-18: 5 mL via INTRAVENOUS

## 2024-07-18 NOTE — Progress Notes (Signed)
 "  Subjective:  Chief Complaint: follow-up for recent pseudomonas pulmonary infection   Patient ID: Melissa [name], female    DOB: June 20, 1966, 59 y.o.   MRN: [id number]  HPI  Discussed the use of AI scribe software for clinical note transcription with the patient, who gave verbal consent to proceed.  History of Present Illness   Melissa [name] is a 59 year old female with hx of  coccidioidomycosis, COPD, and metastatic breast cancer who presents for follow-up after treatment for a pseudomonas lung infection.  She has a complex medical history including coccidioidomycosis, COPD, and metastatic breast cancer. She was previously treated for a possible mold infection with voriconazole  and Cresemba , although no mold was identified in cultures. She has been off antifungal therapy and is currently under observation.  She has a history of brain metastases and was on Herceptin  and Avastin . An MRI on April 16, 2024, was performed to monitor her brain metastases following additional radiation in July 2025. She continues to receive maintenance chemotherapy every three weeks.  h. A bronchoscopy on June 14, 2024, revealed a white cell count of 3,750 cells, predominantly neutrophilic, and a culture positive for pseudomonas aeruginosa   Post bronchoscopy she was hospitalized after being found asleep for more than eighteen hours with a worsening cough.   . She was treated with cefepime  and vancomycin , then narrowed to ciprofloxacin  for a four-week duration, which she is about to complete. She reports that her cough, which was previously constant and especially worse at night, has improved while on ciprofloxacin .  She has a history of a seizure disorder requiring an increased dosage of Keppra . She is scheduled for a repeat brain scan on December 14, 2023, to monitor her brain tumors.  She reports feeling better on ciprofloxacin , with improvement in her cough. She has six pills left of her current prescription.       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)    Coccidioidomycosis 11/05/2023   Complication of anesthesia    bp dropped + desat    COPD (chronic obstructive pulmonary disease) (HCC)    Dyspnea    DOE   GERD (gastroesophageal reflux disease)    H/O coccidioidomycosis    was reason for lung lobectomy   Headache(784.0)    due to eye strain or not eating   Hemoptysis 05/08/2023   History of anemia    no current problem   History of asthma    as a child   History of breast cancer 2014   left   History of chemotherapy    finished 07/17/2013   History of hiatal hernia    AGE 15   Hx of radiation therapy 03/25/13-05/06/13   left breast 5000 cGy/25 sessions, left breast boost 1000 cGy/5 sessions   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   Personal history of chemotherapy    for liver cancer   Personal history of radiation therapy    Pneumonia    LAST FLARE UP 01/2018   Rash 02/22/2021   Runny nose 07/30/2013   clear drainage  Smoker 05/24/2021   Wears dentures    upper    Past Surgical History:  Procedure Laterality Date   APPLICATION OF CRANIAL NAVIGATION N/A 08/04/2017   Procedure: APPLICATION OF CRANIAL NAVIGATION;  Surgeon: [name], Morene Hicks, MD;  Location: [facility] OR;  Service: Neurosurgery;  Laterality: N/A;   AXILLARY LYMPH NODE DISSECTION Left 02/08/2013   Procedure: LEFT AXILLARY DISSECTION;  Surgeon: Morene ONEIDA Olives, MD;  Location: [facility];  Service: General;  Laterality: Left;   BREAST CYST EXCISION Right 2006   BREAST LUMPECTOMY Left 2014   BREAST LUMPECTOMY WITH NEEDLE LOCALIZATION AND AXILLARY SENTINEL LYMPH NODE BX Left  12/31/2012   Procedure: NEEDLE LOCALIZATION LEFT BREAST LUMPECTOMY AND LEFT AXILLARY SENTENIAL LYMPH NODE BX;  Surgeon: Morene ONEIDA Olives, MD;  Location: [facility];  Service: General;  Laterality: Left;   BRONCHIAL BRUSHINGS  03/17/2020   Procedure: BRONCHIAL BRUSHINGS;  Surgeon: Brenna Adine CROME, DO;  Location: [facility];  Service: Pulmonary;;   BRONCHIAL WASHINGS  03/17/2020   Procedure: BRONCHIAL WASHINGS;  Surgeon: Brenna Adine CROME, DO;  Location: [facility];  Service: Pulmonary;;   CESAREAN SECTION  1995/1996   CRANIOTOMY Right 08/04/2017   Procedure: Right Frontal craniotomy for resection of tumor with stereotactic navigation;  Surgeon: [name], Morene Hicks, MD;  Location: [facility] OR;  Service: Neurosurgery;  Laterality: Right;  Right Frontal craniotomy for resection of tumor with stereotactic navigation   IR CV LINE INJECTION  02/19/2021   IR CV LINE INJECTION  11/12/2021   IR IMAGING GUIDED PORT INSERTION  05/31/2019   LUNG LOBECTOMY Left 05/1996   upper lobe - due to [facility] Fever   PORT-A-CATH REMOVAL Right 08/02/2013   Procedure: REMOVAL PORT-A-CATH;  Surgeon: Morene ONEIDA Olives, MD;  Location: [facility];  Service: General;  Laterality: Right;   PORTACATH PLACEMENT  07/02/2012   Procedure: INSERTION PORT-A-CATH;  Surgeon: Morene ONEIDA Olives, MD;  Location: [facility];  Service: General;  Laterality: N/A;  right   VIDEO BRONCHOSCOPY N/A 03/17/2020   Procedure: VIDEO BRONCHOSCOPY;  Surgeon: Brenna Adine CROME, DO;  Location: [facility];  Service: Pulmonary;  Laterality: N/A;   VIDEO BRONCHOSCOPY N/A 06/14/2024   Procedure: VIDEO BRONCHOSCOPY WITHOUT FLUORO;  Surgeon: Kassie Acquanetta Bradley, MD;  Location[facility];  Service: Pulmonary;  Laterality: N/A;   VIDEO BRONCHOSCOPY WITH ENDOBRONCHIAL ULTRASOUND N/A 05/23/2018   Procedure: VIDEO BRONCHOSCOPY WITH ENDOBRONCHIAL ULTRASOUND;  Surgeon: Brenna Adine CROME, DO;  Location: [facility] OR;   Service: Thoracic;  Laterality: N/A;    Family History  Problem Relation Age of Onset   Cancer Mother        breast   Emphysema Mother        was a smoker   Heart disease Mother    Melanoma Mother        dx in her 84s   Breast cancer Mother 88   Asthma Brother    Cancer Cousin        breast   Breast cancer Cousin 2 - 50       mother's maternal cousin      Social History   Socioeconomic History   Marital status: Married    Spouse name: Not on file   Number of children: 2   Years of education: Not on file   Highest education level: Not on file  Occupational History   Occupation: Investment Banker, Corporate  Tobacco Use   Smoking status: Every Day    Current packs/day:  0.30    Average packs/day: 0.3 packs/day for 38.0 years (11.4 ttl pk-yrs)    Types: Cigarettes   Smokeless tobacco: Never   Tobacco comments:    1 pack a day 06/10/2024 KRD  Vaping Use   Vaping status: Never Used  Substance and Sexual Activity   Alcohol use: No   Drug use: No   Sexual activity: Not Currently  Other Topics Concern   Not on file  Social History Narrative   Not on file   Social Drivers of Health   Tobacco Use: High Risk (07/17/2024)   Patient History    Smoking Tobacco Use: Every Day    Smokeless Tobacco Use: Never    Passive Exposure: Not on file  Financial Resource Strain: Low Risk  (01/03/2023)   Received from [facility]   Overall Financial Resource Strain (CARDIA)    Difficulty of Paying Living Expenses: Not hard at all  Food Insecurity: No Food Insecurity (06/19/2024)   Epic    Worried About Radiation Protection Practitioner of Food in the Last Year: Never true    Ran Out of Food in the Last Year: Never true  Transportation Needs: No Transportation Needs (06/19/2024)   Epic    Lack of Transportation (Medical): No    Lack of Transportation (Non-Medical): No  Physical Activity: Not on file  Stress: Not on file  Social Connections: Not on file  Depression (PHQ2-9): Low Risk  (05/17/2024)   Depression (PHQ2-9)    PHQ-2 Score: 0  Alcohol Screen: Low Risk (01/30/2023)   Alcohol Screen    Last Alcohol Screening Score (AUDIT): 0  Housing: Low Risk (06/19/2024)   Epic    Unable to Pay for Housing in the Last Year: No    Number of Times Moved in the Last Year: 0    Homeless in the Last Year: No  Utilities: Not At Risk (06/19/2024)   Epic    Threatened with loss of utilities: No  Health Literacy: Not on file    Allergies[1]  Current Medications[2]   Review of Systems  Constitutional:  Positive for fatigue. Negative for activity change, appetite change, chills, diaphoresis, fever and unexpected weight change.  HENT:  Negative for congestion, rhinorrhea, sinus pressure, sneezing, sore throat and trouble swallowing.   Eyes:  Negative for photophobia and visual disturbance.  Respiratory:  Negative for cough, chest tightness, shortness of breath, wheezing and stridor.   Cardiovascular:  Negative for chest pain, palpitations and leg swelling.  Gastrointestinal:  Negative for abdominal distention, abdominal pain, anal bleeding, blood in stool, constipation, diarrhea, nausea and vomiting.  Genitourinary:  Negative for difficulty urinating, dysuria, flank pain and hematuria.  Musculoskeletal:  Negative for arthralgias, back pain, gait problem, joint swelling and myalgias.  Skin:  Negative for color change, pallor, rash and wound.  Neurological:  Negative for dizziness, tremors, weakness and light-headedness.  Hematological:  Negative for adenopathy. Does not bruise/bleed easily.  Psychiatric/Behavioral:  Negative for agitation, behavioral problems, confusion, decreased concentration, dysphoric mood and sleep disturbance.        Objective:   Physical Exam Constitutional:      General: She is not in acute distress.    Appearance: Normal appearance. She is well-developed. She is not ill-appearing or diaphoretic.  HENT:     Head: Normocephalic and atraumatic.      Right Ear: Hearing and external ear normal.     Left Ear: Hearing and external ear normal.     Nose: No nasal deformity or rhinorrhea.  Eyes:     General: No scleral icterus.    Conjunctiva/sclera: Conjunctivae normal.     Right eye: Right conjunctiva is not injected.     Left eye: Left conjunctiva is not injected.     Pupils: Pupils are equal, round, and reactive to light.  Neck:     Vascular: No JVD.  Cardiovascular:     Rate and Rhythm: Normal rate and regular rhythm.     Heart sounds: Normal heart sounds, S1 normal and S2 normal.     No friction rub. No gallop.  Pulmonary:     Effort: Prolonged expiration present.     Breath sounds: Examination of the right-lower field reveals rhonchi. Examination of the left-lower field reveals rhonchi. Rhonchi present.  Abdominal:     General: Bowel sounds are normal. There is no distension.     Palpations: Abdomen is soft.     Tenderness: There is no abdominal tenderness.  Musculoskeletal:        General: Normal range of motion.     Right shoulder: Normal.     Left shoulder: Normal.     Cervical back: Normal range of motion and neck supple.     Right hip: Normal.     Left hip: Normal.     Right knee: Normal.     Left knee: Normal.  Lymphadenopathy:     Head:     Right side of head: No submandibular, preauricular or posterior auricular adenopathy.     Left side of head: No submandibular, preauricular or posterior auricular adenopathy.     Cervical: No cervical adenopathy.     Right cervical: No superficial or deep cervical adenopathy.    Left cervical: No superficial or deep cervical adenopathy.  Skin:    General: Skin is warm and dry.     Coloration: Skin is not pale.     Findings: No abrasion, bruising, ecchymosis, erythema, lesion or rash.     Nails: There is no clubbing.  Neurological:     Mental Status: She is alert and oriented to person, place, and time.     Sensory: No sensory deficit.     Coordination: Coordination normal.      Gait: Gait normal.  Psychiatric:        Attention and Perception: She is attentive.        Speech: Speech normal.        Behavior: Behavior normal. Behavior is cooperative.        Thought Content: Thought content normal.        Judgment: Judgment normal.           Assessment & Plan:   Assessment and Plan    Pneumonia due to Pseudomonas Pseudomonas aeruginosa identified on bronchoscopy. Ciprofloxacin  improved symptoms.    Possible mold infection: this was seen on pathology specimen with biopsy but culture never grew anything and pathologist at [facility] could also not ID mold based on morphology she has done well off of antifungal therapy and non fungi grew from BAL  Bronchiectasis with cavitary pathology: will followup her AF[employer] cultures as well though doubt we would treat her with her having better symptoms now without treatment for M avium   Breast cancer with metastases: conitinues on  avastin  and following closely with Dr. Gudena, Morna Kendall, NP and Dr. Charmain   Smoker: still smoking      Vaccine counseling: she has had flu vaccine but deferred covid vaccine. Also recommended masking    [1]  Allergies  Allergen Reactions   Aspirin Anaphylaxis and Shortness Of Breath    THROAT CLOSES   Protonix  [Pantoprazole ] Nausea Only and Other (See Comments)    Also caused a film in the mouth and caused chest pressure   Doxycycline  Other (See Comments)    Hallucinations, headaches   Promethazine -Codeine  Cough    Worsening cough and insomnia    Sulfamethoxazole-Trimethoprim  Other (See Comments)    unknown   Iodinated Contrast Media Rash    Patient allergic to contrast used in radiation oncology for CT simulation   [2]  Current Outpatient Medications:    acetaminophen  (TYLENOL ) 325 MG tablet, Take 2 tablets (650 mg total) by mouth every 6 (six) hours as needed for mild pain (or Fever >/= 101)., Disp: 12 tablet, Rfl: 0   albuterol  (VENTOLIN  HFA) 108 (90 Base) MCG/ACT  inhaler, Inhale 2 puffs into the lungs every 6 (six) hours as needed for wheezing or shortness of breath (cough)., Disp: 18 g, Rfl: 5   chlorpheniramine -HYDROcodone  (TUSSIONEX) 10-8 MG/5ML, Take 5 mLs by mouth 2 (two) times daily., Disp: 300 mL, Rfl: 0   ciprofloxacin  (CIPRO ) 750 MG tablet, Take 1 tablet (750 mg total) by mouth 2 (two) times daily for 57 doses., Disp: 57 tablet, Rfl: 0   dexamethasone  (DECADRON ) 2 MG tablet, Take 1 tablet (2 mg total) by mouth daily., Disp: 90 tablet, Rfl: 0   diphenhydrAMINE  (BENADRYL ) 50 MG tablet, Take 50 mg of Benadryl  on 05/22/24 at 1:15 pm with the last dose of Prednisone  50 mg . Please call [phone] with any questions., Disp: 1 tablet, Rfl: 0   escitalopram  (LEXAPRO ) 10 MG tablet, Take 10 mg by mouth daily., Disp: , Rfl:    fluticasone -salmeterol (WIXELA INHUB) 250-50 MCG/ACT AEPB, Inhale 1 puff into the lungs in the morning and at bedtime., Disp: 60 each, Rfl: 6   levalbuterol  (XOPENEX ) 0.63 MG/3ML nebulizer solution, Take 3 mLs (0.63 mg total) by nebulization every 4 (four) hours as needed for wheezing or shortness of breath., Disp: 90 mL, Rfl: 12   levETIRAcetam  (KEPPRA ) 250 MG tablet, Take 1 tablet (250 mg total) by mouth 2 (two) times daily., Disp: 60 tablet, Rfl: 3   lidocaine -prilocaine  (EMLA ) cream, APPLY EXTERNALLY TO THE AFFECTED AREA 1 TIME, Disp: 30 g, Rfl: 3   Multiple Vitamins-Minerals (EMERGEN-C VITAMIN C PO), Take 1 tablet by mouth daily., Disp: , Rfl:    naproxen sodium (ALEVE) 220 MG tablet, Take 220 mg by mouth daily as needed (for pain)., Disp: , Rfl:    ondansetron  (ZOFRAN -ODT) 4 MG disintegrating tablet, Take 4 mg by mouth every 8 (eight) hours as needed for vomiting or nausea., Disp: , Rfl:  No current facility-administered medications for this visit.  Facility-Administered Medications Ordered in Other Visits:    alteplase  (CATHFLO ACTIVASE ) injection 2 mg, 2 mg, Intracatheter, Once PRN, Gudena, Vinay, MD   sodium chloride  flush  (NS) 0.9 % injection 10 mL, 10 mL, Intracatheter, PRN, Gudena, Vinay, MD, 10 mL at 07/06/21 1428  "

## 2024-07-19 ENCOUNTER — Other Ambulatory Visit: Payer: Self-pay

## 2024-07-22 ENCOUNTER — Inpatient Hospital Stay: Admitting: Internal Medicine

## 2024-07-22 ENCOUNTER — Inpatient Hospital Stay

## 2024-07-22 VITALS — BP 95/63 | HR 77 | Temp 97.2°F | Resp 17 | Ht 62.0 in | Wt 114.0 lb

## 2024-07-22 DIAGNOSIS — C7931 Secondary malignant neoplasm of brain: Secondary | ICD-10-CM

## 2024-07-22 DIAGNOSIS — Z5112 Encounter for antineoplastic immunotherapy: Secondary | ICD-10-CM | POA: Diagnosis not present

## 2024-07-22 NOTE — Progress Notes (Signed)
 "  [facility] at [facility] [address]  [place], [place] [address] [phone]   Interval Evaluation  Date of Service: 07/22/2024 Patient Name: Melissa [name] Patient MRN: [id number] Patient DOB: 30-Jul-1965 Provider: Arthea MARLA Manns, MD  Identifying Statement:  Melissa [name] is a 59 y.o. 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 Ki-67 was 70% HER-2 was amplified with a ratio 2.91   07/12/2012 - 07/17/2013 Neo-Adjuvant Chemotherapy   TCH 6 followed by Herceptin  maintenance   08/14/2012 Genetic Testing   Negative genetic testing on the [facility].  The report date is August 14, 2012.  The [facility] gene panel offered by [facility] includes sequencing and deletion/duplication testing of the following 25 genes: APC, ATM, , BARD1, BMPR1A, BRCA1, BRCA2, BRIP1, CHD1, CDK4, CDKN2A, CHEK2, EPCAM (large rearrangement only), MLH1, MSH2, MSH6, MUTYH, NBN, PALB2, PMS2, PTEN, RAD51C, RAD51D, SMAD4, STK11, and TP53.     12/11/2012 Surgery   Left breast lumpectomy: 1.8 cm tumor 1 positive sentinel node, axillary lymph node dissection 02/08/2013 showed 0/13 lymph nodes   03/25/2013 - 05/06/2013 Radiation Therapy   Adjuvant radiation therapy   06/05/2013 - 07/20/2017 Anti-estrogen oral therapy   Tamoxifen  20 mg daily   07/27/2017 Relapse/Recurrence   MRI Brain: 3.4 x 2.9 x 2.9 cm RIGHT frontal lobe mass with imaging characteristics of solitary metastasis. Extensive vasogenic edema resulting in 9 mm RIGHT to LEFT midline shift. Equivocal very early LEFT ventricle entrapment.    08/04/2017 Surgery   Rt frontal brain resection:  Poorly differentiated tumor IHC suggests breast primary ER and PR Positive   08/25/2017 - 09/04/2017 Radiation Therapy   Stereotactic radiation   09/18/2017 -  Anti-estrogen oral therapy   Lapatinib  with letrozole    12/18/2017 - 12/19/2017 Radiation Therapy   New right parietal lobe metastases status post [employer]   08/27/2018 - 08/27/2020 Radiation Therapy   [name] to new brain metastases   05/09/2019 Relapse/Recurrence   Interval increase in size of the enhancing nodular left internal mammary soft tissue 2.8 cm.  Redemonstrated enlarged supraclavicular, lower cervical and lower posterior cervical nodes unchanged.  Interval increase in the bony erosion of the posterior and lateral left third rib, increasing soft tissue lesion eroding the left sternal body 3.1 cm was 2.5 cm.  Bronchiectatic changes   06/19/2019 - 05/05/2020 Chemotherapy   ado-trastuzumab emtansine  (KADCYLA )     06/15/2020 -  Chemotherapy   Xeloda , Tucatinib , Herceptin     04/12/2022 - 09/06/2022 Chemotherapy   Patient is on Treatment Plan : BREAST Trastuzumab  IV (8/6) or SQ (600) D1 q21d     09/27/2022 -  Chemotherapy   Patient is on Treatment Plan : BREAST MAINTENANCE Trastuzumab  IV every 3 weeks (21 days)     11/08/2022 Miscellaneous   Adding low-dose neratinib  (80 mg) to Herceptin    12/20/2022 Cancer Staging   Staging form: Breast, AJCC 7th Edition - Pathologic: Stage IV (M1) - Signed by Crawford Morna Pickle, NP on 12/20/2022   Cancer of left breast metastatic to brain /Initial Ca 1997, Brain Mets 2019  06/10/2019 Initial Diagnosis   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  Concurrent avastin  initiated q3 weeks 12/29/22: Craniotomy, resection R temporal (Fecci).  Path is 70% neoplasm 11/04/22: Post-op frx [name] L occipital Cinda) 10/06/22: Craniotomy, resection L occipital at [facility]), path is neoplasm 10/12/21: [name] to pons 16 Gy Cinda) 06/29/21: [name] to 2x L occipital 03/17/21: [name] x3 08/27/20: [name] x5 05/13/20: [name] x4  03/25/19: [name] right frontal 5 mm 09/04/18: [name] x2 right frontal 12/18/17: [name] R parietal 09/04/17: Post op [name] R frontal, 5 fxs 08/04/17: Craniotomy, resection R frontal met ([name])  Interval History: Melissa [name] presents today for follow up after recent MRI brain.  No clinical changes today.  She continues Kanjinti  infusions with Dr. Gudena.  She feels well today, still has right sided visual impairment, left sided facial numbness.  No seizures or headaches.    Prior: Melissa [name] for recurrent brainstem metastasis and right temporal resection cavity.   She describes several weeks history of coughing up blood, including large clots.  She has been evaluated by pulmonology, etiology is unclear.  Does report an increase in fatigue and lethargy since symptom onset.  Ohterwise no new or progressive neurologic complaints; she continues to experience numbness and tingling in her left hand since surgery in June.  Walking independently, no seizures or headaches.  Prior: She describes some lingering tingling of fingertips in her left hand, but this has actually improved in recent days.  There are still head pain sensations following surgery, described as lightning bolt feeling which lasts for 3-5 seconds, occurs 2-3x per hour.  This has not improved with Tylenol  or other pain medications.  Otherwise no new or progressive neurologic symptoms, remains functionally intact and independent with gait.  Continues on Kanjinti  with Dr. Gudena, tolerating it well.  Planning  upcoming trip to beach with new camper.   She describes recurrence of daily headaches since surgery.  Has been dosing Tylenol  or Aleve daily.  Sleep has been poor at times.  Steroids had been discontinued.  Her right sided visual symptoms have not recurred.  Otherwise no new or progressive changes.  H+P (08/02/22) Patient presents today for headache evaluation.  She describes pain along the temples mild to moderate, every day for the past month.  Prior to this she had very sporadic migraine type headaches, which these differ from.  No associated neurologic deficits.  She does dose codeine  based cough syrup every night (for the past year), and has chronic sleep impairment.  Decadron  2mg  twice per day, started last week, has not helped any symptoms.  Also describes some shadowy visual loss on the right side, at times.  Continues on herceptin  with Dr. Gudena.  Medications: Current Outpatient Medications on File Prior to Visit  Medication Sig Dispense Refill   acetaminophen  (TYLENOL ) 325 MG tablet Take 2 tablets (650 mg total) by mouth every 6 (six) hours as needed for mild pain (or Fever >/= 101). 12 tablet 0   albuterol  (VENTOLIN  HFA) 108 (90 Base) MCG/ACT inhaler Inhale 2 puffs into the lungs every 6 (six) hours as needed for wheezing or shortness of breath (cough). 18 g 5  chlorpheniramine -HYDROcodone  (TUSSIONEX) 10-8 MG/5ML Take 5 mLs by mouth 2 (two) times daily. 300 mL 0   dexamethasone  (DECADRON ) 2 MG tablet Take 1 tablet (2 mg total) by mouth daily. 90 tablet 0   diphenhydrAMINE  (BENADRYL ) 50 MG tablet Take 50 mg of Benadryl  on 05/22/24 at 1:15 pm with the last dose of Prednisone  50 mg . Please call [phone] with any questions. 1 tablet 0   escitalopram  (LEXAPRO ) 10 MG tablet Take 10 mg by mouth daily.     fluticasone -salmeterol (WIXELA INHUB) 250-50 MCG/ACT AEPB Inhale 1 puff into the lungs in the morning and at bedtime. 60 each 6   levalbuterol  (XOPENEX ) 0.63 MG/3ML nebulizer solution  Take 3 mLs (0.63 mg total) by nebulization every 4 (four) hours as needed for wheezing or shortness of breath. 90 mL 12   levETIRAcetam  (KEPPRA ) 250 MG tablet Take 1 tablet (250 mg total) by mouth 2 (two) times daily. 60 tablet 3   lidocaine -prilocaine  (EMLA ) cream APPLY EXTERNALLY TO THE AFFECTED AREA 1 TIME 30 g 3   Multiple Vitamins-Minerals (EMERGEN-C VITAMIN C PO) Take 1 tablet by mouth daily.     naproxen sodium (ALEVE) 220 MG tablet Take 220 mg by mouth daily as needed (for pain).     ondansetron  (ZOFRAN -ODT) 4 MG disintegrating tablet Take 4 mg by mouth every 8 (eight) hours as needed for vomiting or nausea. (Patient not taking: Reported on 07/18/2024)     Current Facility-Administered Medications on File Prior to Visit  Medication Dose Route Frequency Provider Last Rate Last Admin   alteplase  (CATHFLO ACTIVASE ) injection 2 mg  2 mg Intracatheter Once PRN Odean Potts, MD       sodium chloride  flush (NS) 0.9 % injection 10 mL  10 mL Intracatheter PRN Odean Potts, MD   10 mL at 07/06/21 1428    Allergies:  Allergies  Allergen Reactions   Aspirin Anaphylaxis and Shortness Of Breath    THROAT CLOSES   Protonix  [Pantoprazole ] Nausea Only and Other (See Comments)    Also caused a film in the mouth and caused chest pressure   Doxycycline  Other (See Comments)    Hallucinations, headaches   Promethazine -Codeine  Cough    Worsening cough and insomnia    Sulfamethoxazole-Trimethoprim  Other (See Comments)    unknown   Iodinated Contrast Media Rash    Patient allergic to contrast used in radiation oncology for CT simulation    Past Medical History:  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   Asthma    Breast cancer (HCC)    Bronchiectasis (HCC)    Bronchiectasis (HCC) 07/18/2024   Bronchiolitis    Cancer (HCC)    breast cancer 2014   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)    2019   Cancer,  metastatic to liver (HCC)    2021   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)    Coccidioidomycosis 11/05/2023   Complication of anesthesia    bp dropped + desat    COPD (chronic obstructive pulmonary disease) (HCC)    Dyspnea    DOE   GERD (gastroesophageal reflux disease)    H/O coccidioidomycosis    was reason for lung lobectomy   Headache(784.0)    due to eye strain or not eating   Hemoptysis 05/08/2023   History of anemia    no current problem   History of asthma    as a  child   History of breast cancer 2014   left   History of chemotherapy    finished 07/17/2013   History of hiatal hernia    AGE 15   Hx of radiation therapy 03/25/13-05/06/13   left breast 5000 cGy/25 sessions, left breast boost 1000 cGy/5 sessions   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   Personal history of chemotherapy    for liver cancer   Personal history of radiation therapy    Pneumonia    LAST FLARE UP 01/2018   Pseudomonas pneumonia (HCC) 07/18/2024   Rash 02/22/2021   Runny nose 07/30/2013   clear drainage   Smoker 05/24/2021   Wears dentures    upper   Past Surgical History:  Past Surgical History:  Procedure Laterality Date   APPLICATION OF CRANIAL NAVIGATION N/A 08/04/2017   Procedure: APPLICATION OF CRANIAL NAVIGATION;  Surgeon: [name], Morene Hicks, MD;  Location: [facility] OR;  Service: Neurosurgery;  Laterality: N/A;   AXILLARY LYMPH NODE DISSECTION Left 02/08/2013   Procedure: LEFT AXILLARY DISSECTION;  Surgeon: Morene ONEIDA Olives, MD;  Location: [facility];  Service: General;  Laterality: Left;   BREAST CYST EXCISION Right 2006   BREAST LUMPECTOMY Left 2014   BREAST LUMPECTOMY WITH NEEDLE LOCALIZATION AND AXILLARY SENTINEL LYMPH NODE BX Left 12/31/2012   Procedure: NEEDLE LOCALIZATION LEFT BREAST LUMPECTOMY AND LEFT AXILLARY SENTENIAL LYMPH NODE BX;  Surgeon: Morene ONEIDA Olives, MD;  Location: [facility];  Service: General;  Laterality: Left;   BRONCHIAL BRUSHINGS   03/17/2020   Procedure: BRONCHIAL BRUSHINGS;  Surgeon: Brenna Adine CROME, DO;  Location: [facility];  Service: Pulmonary;;   BRONCHIAL WASHINGS  03/17/2020   Procedure: BRONCHIAL WASHINGS;  Surgeon: Brenna Adine CROME, DO;  Location: [facility];  Service: Pulmonary;;   CESAREAN SECTION  1995/1996   CRANIOTOMY Right 08/04/2017   Procedure: Right Frontal craniotomy for resection of tumor with stereotactic navigation;  Surgeon: [name], Morene Hicks, MD;  Location: [facility] OR;  Service: Neurosurgery;  Laterality: Right;  Right Frontal craniotomy for resection of tumor with stereotactic navigation   IR CV LINE INJECTION  02/19/2021   IR CV LINE INJECTION  11/12/2021   IR IMAGING GUIDED PORT INSERTION  05/31/2019   LUNG LOBECTOMY Left 05/1996   upper lobe - due to [facility] Fever   PORT-A-CATH REMOVAL Right 08/02/2013   Procedure: REMOVAL PORT-A-CATH;  Surgeon: Morene ONEIDA Olives, MD;  Location: [facility];  Service: General;  Laterality: Right;   PORTACATH PLACEMENT  07/02/2012   Procedure: INSERTION PORT-A-CATH;  Surgeon: Morene ONEIDA Olives, MD;  Location: [facility];  Service: General;  Laterality: N/A;  right   VIDEO BRONCHOSCOPY N/A 03/17/2020   Procedure: VIDEO BRONCHOSCOPY;  Surgeon: Brenna Adine CROME, DO;  Location: [facility];  Service: Pulmonary;  Laterality: N/A;   VIDEO BRONCHOSCOPY N/A 06/14/2024   Procedure: VIDEO BRONCHOSCOPY WITHOUT FLUORO;  Surgeon: Kassie Acquanetta Bradley, MD;  Location[facility];  Service: Pulmonary;  Laterality: N/A;   VIDEO BRONCHOSCOPY WITH ENDOBRONCHIAL ULTRASOUND N/A 05/23/2018   Procedure: VIDEO BRONCHOSCOPY WITH ENDOBRONCHIAL ULTRASOUND;  Surgeon: Brenna Adine CROME, DO;  Location: [facility] OR;  Service: Thoracic;  Laterality: N/A;   Social History:  Social History   Socioeconomic History   Marital status: Married    Spouse name: Not on file   Number of children: 2   Years of education: Not on file   Highest education level: Not on  file  Occupational History   Occupation: Investment Banker, Corporate  Tobacco  Use   Smoking status: Every Day    Current packs/day: 0.30    Average packs/day: 0.3 packs/day for 38.0 years (11.4 ttl pk-yrs)    Types: Cigarettes   Smokeless tobacco: Never   Tobacco comments:    1 pack a day 06/10/2024 KRD  Vaping Use   Vaping status: Never Used  Substance and Sexual Activity   Alcohol use: No   Drug use: No   Sexual activity: Not Currently  Other Topics Concern   Not on file  Social History Narrative   Not on file   Social Drivers of Health   Tobacco Use: High Risk (07/18/2024)   Patient History    Smoking Tobacco Use: Every Day    Smokeless Tobacco Use: Never    Passive Exposure: Not on file  Financial Resource Strain: Low Risk  (01/03/2023)   Received from [facility]   Overall Financial Resource Strain (CARDIA)    Difficulty of Paying Living Expenses: Not hard at all  Food Insecurity: No Food Insecurity (06/19/2024)   Epic    Worried About Radiation Protection Practitioner of Food in the Last Year: Never true    Ran Out of Food in the Last Year: Never true  Transportation Needs: No Transportation Needs (06/19/2024)   Epic    Lack of Transportation (Medical): No    Lack of Transportation (Non-Medical): No  Physical Activity: Not on file  Stress: Not on file  Social Connections: Not on file  Intimate Partner Violence: Not At Risk (06/19/2024)   Epic    Fear of Current or Ex-Partner: No    Emotionally Abused: No    Physically Abused: No    Sexually Abused: No  Depression (PHQ2-9): Low Risk (05/17/2024)   Depression (PHQ2-9)    PHQ-2 Score: 0  Alcohol Screen: Low Risk (01/30/2023)   Alcohol Screen    Last Alcohol Screening Score (AUDIT): 0  Housing: Low Risk (06/19/2024)   Epic    Unable to Pay for Housing in the Last Year: No    Number of Times Moved in the Last Year: 0    Homeless in the Last Year: No  Utilities: Not At Risk (06/19/2024)   Epic    Threatened with loss of  utilities: No  Health Literacy: Not on file   Family History:  Family History  Problem Relation Age of Onset   Cancer Mother        breast   Emphysema Mother        was a smoker   Heart disease Mother    Melanoma Mother        dx in her 49s   Breast cancer Mother 52   Asthma Brother    Cancer Cousin        breast   Breast cancer Cousin 50 - 84       mother's maternal cousin    Review of Systems: Constitutional: Doesn't report fevers, chills or abnormal weight loss Eyes: Doesn't report blurriness of vision Ears, nose, mouth, throat, and face: Doesn't report sore throat Respiratory: Doesn't report cough, dyspnea or wheezes Cardiovascular: Doesn't report palpitation, chest discomfort  Gastrointestinal:  Doesn't report nausea, constipation, diarrhea GU: Doesn't report incontinence Skin: Doesn't report skin rashes Neurological: Per HPI Musculoskeletal: Doesn't report joint pain Behavioral/Psych: Doesn't report anxiety  Physical Exam: Vitals:   07/22/24 1154  BP: 95/63  Pulse: 77  Resp: 17  Temp: (!) 97.2 F (36.2 C)  SpO2: 100%     KPS: 80. General:  Alert, cooperative, pleasant, in no acute distress Head: Normal EENT: No conjunctival injection or scleral icterus.  Lungs: Resp effort normal Cardiac: Regular rate Abdomen: Non-distended abdomen Skin: No rashes cyanosis or petechiae. Extremities: No clubbing or edema  Neurologic Exam: Mental Status: Awake, alert, attentive to examiner. Oriented to self and environment. Language is fluent with intact comprehension.  Cranial Nerves: Visual acuity is grossly normal. Visual fields are full. Extra-ocular movements intact. No ptosis. Face is symmetric Motor: Tone and bulk are normal. Power is full in both arms and legs. Reflexes are symmetric, no pathologic reflexes present.  Sensory: Intact to light touch Gait: Normal.   Labs: I have reviewed the data as listed    Component Value Date/Time   NA 137 06/22/2024  0319   NA 142 08/12/2014 0956   K 3.8 06/22/2024 0319   K 4.6 08/12/2014 0956   CL 102 06/22/2024 0319   CL 100 12/20/2012 1509   CO2 28 06/22/2024 0319   CO2 26 08/12/2014 0956   GLUCOSE 82 06/22/2024 0319   GLUCOSE 94 08/12/2014 0956   GLUCOSE 98 12/20/2012 1509   BUN 6 06/22/2024 0319   BUN 10.1 08/12/2014 0956   CREATININE 0.74 06/22/2024 0319   CREATININE 0.61 04/02/2024 0834   CREATININE 0.83 03/28/2023 1051   CREATININE 0.9 08/12/2014 0956   CALCIUM 8.6 (L) 06/22/2024 0319   CALCIUM 9.3 08/12/2014 0956   PROT 6.2 (L) 06/20/2024 0344   PROT 7.1 08/12/2014 0956   ALBUMIN 1.8 (L) 06/20/2024 0344   ALBUMIN 3.8 08/12/2014 0956   AST 13 (L) 06/20/2024 0344   AST 11 (L) 04/02/2024 0834   AST 15 08/12/2014 0956   ALT 17 06/20/2024 0344   ALT 13 04/02/2024 0834   ALT 11 08/12/2014 0956   ALKPHOS 71 06/20/2024 0344   ALKPHOS 68 08/12/2014 0956   BILITOT 0.5 06/20/2024 0344   BILITOT 0.2 04/02/2024 0834   BILITOT 0.25 08/12/2014 0956   GFRNONAA >60 06/22/2024 0319   GFRNONAA >60 04/02/2024 0834   GFRNONAA 76 03/11/2019 1057   GFRAA >60 03/24/2020 1012   GFRAA >60 02/11/2020 0836   GFRAA 88 03/11/2019 1057   Lab Results  Component Value Date   WBC 11.7 (H) 07/16/2024   NEUTROABS 7.9 (H) 07/16/2024   HGB 9.8 (L) 07/16/2024   HCT 31.0 (L) 07/16/2024   MCV 82.9 07/16/2024   PLT 359 07/16/2024    Imaging:  EXAM: MRI BRAIN WITH AND WITHOUT CONTRAST 07/18/2024 12:41:00 PM   TECHNIQUE: Multiplanar multisequence MRI of the head/brain was performed with and without the administration of intravenous contrast.   COMPARISON: 04/16/2024   CLINICAL HISTORY: Brain/CNS neoplasm, assess treatment response.   FINDINGS:   BRAIN AND VENTRICLES: No acute infarct. No acute intracranial hemorrhage. No significant midline shift. No hydrocephalus. Normal flow voids. The sella is unremarkable. Multiple enhancing intracranial lesions are again noted. There is a 1.0 x 0.6 cm  lesion in the pons previously measuring up to 0.8 cm (series 15, image 46).   A 0.9 cm enhancing lesion is present in the superior cerebellar vermis previously 0.6 cm (series 15, image 56).   A heterogeneously enhancing lesion is seen in the anterolateral right temporal lobe; there are increased areas of irregular enhancement more superiorly within this lesion in particular (series 15, image 54 and image 64).   A new 0.8 cm enhancing lesion is adjacent to the right temporal horn (series 15, image 61).   A new punctate and enhancing lesion is  present in the inferior left cerebellum (series 15, image 25).   Postsurgical changes of left occipital craniotomy are noted with similar underlying encephalomalacia likely reflecting postoperative changes.   There are a few nodular areas of enhancement along the margin of the resection cavity (series 15, image 72).   A 1.9 x 1.1 cm enhancing lesion is present in the right frontal lobe along the falx previously measuring 0.9 cm in greatest diameter (series 15, image 130).   There is mild adjacent enhancement along the falx. Enhancement in the inferomedial left temporal lobe is increased from prior (series 15, image 52).   Slightly increased nodular enhancement is seen in the anterior inferior right frontal lobe (series 15, image 78).   Similar postsurgical changes are present in the anterior right frontal lobe with a few areas of ill-defined enhancement along the resection cavity. Enhancement along the medial and posterior aspects of the resection cavity appears slightly increased (series 15, image 107).   Overall similar appearance of enhancement along the dura over the anterior right frontal lobe.   There is increased edema in the right superior frontal gyrus adjacent to the perifalcine lesion. Edema in the right temporal lobe is overall similar to prior. Edema in the left occipital lobe is overall similar to prior. T2 and FLAIR  hyperintensity in the supratentorial white matter which likely reflect chronic microvascular ischemic changes versus post treatment changes.   ORBITS: No acute abnormality.   SINUSES: No acute abnormality.   BONES AND SOFT TISSUES: Normal bone marrow signal and enhancement. No acute soft tissue abnormality.   IMPRESSION: 1. Progression of intracranial metastatic disease with new and enlarging enhancing lesions as described above. 2. Postsurgical changes of left occipital and anterior right frontal resections with slightly increased nodular enhancement along the resection cavities. 3. No significant midline shift.   Electronically signed by: Donnice Mania MD MD 07/18/2024 05:14 PM EST RP  [facility] Clinician Interpretation: I have personally reviewed the radiological images as listed.  My interpretation, in the context of the patient's clinical presentation, is treatment effect vs true progression   Assessment/Plan Metastasis to brain [facility])  Burnard HERO Vanburen is clinically stable today.  MRI brain demonstrates increase in burden of enhancement diffusely, consistent with likely avastin  washout.  MRI is stable when compared to last pre-avastin  study in June 2025.  There is one novel focus of enhancement within the right temporal lobe.  RT treatment plan fusion clearly demonstrated that lesion within the recent 30Gy/10 field, and also at the edge of the post-op treatment field from 02/20/23. 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  Recommended remaining off avastin  and continuing imaging surveillance only at this time.  She remains at high risk for radiation necrosis.  Decadron  should decrease to 1mg  daily if tolerated.  We appreciate the opportunity to participate in the care of ARLO BUFFONE.   We ask that KORRYN PANCOAST return to clinic in 2 months following next brain MRI, or sooner as needed.  All questions were answered. The patient knows to call  the clinic with any problems, questions or concerns. No barriers to learning were detected.  The total time spent in the encounter was 40 minutes and more than 50% was on counseling and review of test results   Arthea MARLA Manns, MD Medical Director of Neuro-Oncology [facility] 07/22/2024 11:55 AM "

## 2024-07-23 ENCOUNTER — Telehealth: Payer: Self-pay | Admitting: Internal Medicine

## 2024-07-23 ENCOUNTER — Other Ambulatory Visit: Payer: Self-pay | Admitting: Radiation Therapy

## 2024-07-23 NOTE — Telephone Encounter (Signed)
 Scheduled patient for next appointment. Called and left a voicemail with the details.

## 2024-07-25 ENCOUNTER — Encounter: Payer: Self-pay | Admitting: Pulmonary Disease

## 2024-07-29 LAB — ACID FAST CULTURE WITH REFLEXED SENSITIVITIES (MYCOBACTERIA): Acid Fast Culture: NEGATIVE

## 2024-08-02 ENCOUNTER — Other Ambulatory Visit: Payer: Self-pay | Admitting: *Deleted

## 2024-08-02 ENCOUNTER — Other Ambulatory Visit: Payer: Self-pay | Admitting: Hematology and Oncology

## 2024-08-02 ENCOUNTER — Other Ambulatory Visit: Payer: Self-pay

## 2024-08-02 DIAGNOSIS — C50212 Malignant neoplasm of upper-inner quadrant of left female breast: Secondary | ICD-10-CM

## 2024-08-02 MED ORDER — HYDROCOD POLI-CHLORPHE POLI ER 10-8 MG/5ML PO SUER: 5.0000 mL | Freq: Two times a day (BID) | ORAL | 0 refills | Status: AC

## 2024-08-02 NOTE — Progress Notes (Signed)
 Chlorpheniramine  Hydrocodone  refilled on behalf of Dr Odean.  Melissa [name]

## 2024-08-05 ENCOUNTER — Telehealth: Payer: Self-pay | Admitting: Hematology and Oncology

## 2024-08-05 NOTE — Telephone Encounter (Signed)
 Rescheduled appointment due to cancer center opening up at 10am on 1/27. Called and left a VM with the appointment details for the patient.

## 2024-08-06 ENCOUNTER — Inpatient Hospital Stay

## 2024-08-07 NOTE — Telephone Encounter (Signed)
 Patient was hospitalized on 06/19/2024-06/22/2024 for pneumonia.

## 2024-08-08 ENCOUNTER — Inpatient Hospital Stay

## 2024-08-08 ENCOUNTER — Other Ambulatory Visit: Payer: Self-pay | Admitting: *Deleted

## 2024-08-08 VITALS — BP 117/57 | HR 75 | Temp 97.8°F | Resp 17 | Ht 62.0 in | Wt 113.5 lb

## 2024-08-08 DIAGNOSIS — C50212 Malignant neoplasm of upper-inner quadrant of left female breast: Secondary | ICD-10-CM

## 2024-08-08 DIAGNOSIS — C50912 Malignant neoplasm of unspecified site of left female breast: Secondary | ICD-10-CM

## 2024-08-08 DIAGNOSIS — Z5112 Encounter for antineoplastic immunotherapy: Secondary | ICD-10-CM | POA: Diagnosis not present

## 2024-08-08 LAB — CBC WITH DIFFERENTIAL (CANCER CENTER ONLY)
Abs Immature Granulocytes: 0.01 10*3/uL (ref 0.00–0.07)
Basophils Absolute: 0.1 10*3/uL (ref 0.0–0.1)
Basophils Relative: 1 %
Eosinophils Absolute: 0.6 10*3/uL — ABNORMAL HIGH (ref 0.0–0.5)
Eosinophils Relative: 8 %
HCT: 34.3 % — ABNORMAL LOW (ref 36.0–46.0)
Hemoglobin: 11.1 g/dL — ABNORMAL LOW (ref 12.0–15.0)
Immature Granulocytes: 0 %
Lymphocytes Relative: 35 %
Lymphs Abs: 2.9 10*3/uL (ref 0.7–4.0)
MCH: 27 pg (ref 26.0–34.0)
MCHC: 32.4 g/dL (ref 30.0–36.0)
MCV: 83.5 fL (ref 80.0–100.0)
Monocytes Absolute: 0.7 10*3/uL (ref 0.1–1.0)
Monocytes Relative: 9 %
Neutro Abs: 3.9 10*3/uL (ref 1.7–7.7)
Neutrophils Relative %: 47 %
Platelet Count: 310 10*3/uL (ref 150–400)
RBC: 4.11 MIL/uL (ref 3.87–5.11)
RDW: 18.7 % — ABNORMAL HIGH (ref 11.5–15.5)
WBC Count: 8.2 10*3/uL (ref 4.0–10.5)
nRBC: 0 % (ref 0.0–0.2)

## 2024-08-08 LAB — CMP (CANCER CENTER ONLY)
ALT: 16 U/L (ref 0–44)
AST: 21 U/L (ref 15–41)
Albumin: 3.8 g/dL (ref 3.5–5.0)
Alkaline Phosphatase: 79 U/L (ref 38–126)
Anion gap: 9 (ref 5–15)
BUN: 11 mg/dL (ref 6–20)
CO2: 27 mmol/L (ref 22–32)
Calcium: 9.9 mg/dL (ref 8.9–10.3)
Chloride: 102 mmol/L (ref 98–111)
Creatinine: 0.77 mg/dL (ref 0.44–1.00)
GFR, Estimated: >60 mL/min
Glucose, Bld: 85 mg/dL (ref 70–99)
Potassium: 4.3 mmol/L (ref 3.5–5.1)
Sodium: 138 mmol/L (ref 135–145)
Total Bilirubin: <0.2 mg/dL (ref 0.0–1.2)
Total Protein: 7.3 g/dL (ref 6.5–8.1)

## 2024-08-08 MED ORDER — ACETAMINOPHEN 325 MG PO TABS
650.0000 mg | ORAL_TABLET | Freq: Once | ORAL | Status: AC
  Administered 2024-08-08: 650 mg via ORAL
  Filled 2024-08-08: qty 2

## 2024-08-08 MED ORDER — DIPHENHYDRAMINE HCL 25 MG PO CAPS
25.0000 mg | ORAL_CAPSULE | Freq: Once | ORAL | Status: AC
  Administered 2024-08-08: 25 mg via ORAL
  Filled 2024-08-08: qty 1

## 2024-08-08 MED ORDER — TRASTUZUMAB-ANNS CHEMO 150 MG IV SOLR
6.0000 mg/kg | Freq: Once | INTRAVENOUS | Status: AC
  Administered 2024-08-08: 300 mg via INTRAVENOUS
  Filled 2024-08-08: qty 14.29

## 2024-08-08 MED ORDER — SODIUM CHLORIDE 0.9 % IV SOLN: Freq: Once | INTRAVENOUS | Status: AC

## 2024-08-08 NOTE — Patient Instructions (Signed)

## 2024-08-16 ENCOUNTER — Other Ambulatory Visit: Payer: Self-pay | Admitting: *Deleted

## 2024-08-16 DIAGNOSIS — Z171 Estrogen receptor negative status [ER-]: Secondary | ICD-10-CM

## 2024-08-16 DIAGNOSIS — C50912 Malignant neoplasm of unspecified site of left female breast: Secondary | ICD-10-CM

## 2024-08-16 MED ORDER — LIDOCAINE-PRILOCAINE 2.5-2.5 % EX CREA: TOPICAL_CREAM | CUTANEOUS | 3 refills | Status: AC

## 2024-08-27 ENCOUNTER — Inpatient Hospital Stay: Attending: Hematology and Oncology

## 2024-08-27 ENCOUNTER — Inpatient Hospital Stay

## 2024-08-27 ENCOUNTER — Inpatient Hospital Stay: Admitting: Hematology and Oncology

## 2024-09-19 ENCOUNTER — Inpatient Hospital Stay: Admission: RE | Admit: 2024-09-19 | Source: Ambulatory Visit

## 2024-09-23 ENCOUNTER — Inpatient Hospital Stay

## 2024-09-24 ENCOUNTER — Inpatient Hospital Stay: Admitting: Internal Medicine

## 2024-10-14 ENCOUNTER — Ambulatory Visit: Payer: Self-pay | Admitting: Infectious Disease
